# Patient Record
Sex: Female | Born: 1950 | Race: White | Hispanic: No | Marital: Single | Smoking: Current every day smoker
Health system: Southern US, Community
[De-identification: ages and names within clinical notes are randomized; demographics above are authoritative.]

## PROBLEM LIST (undated history)

## (undated) DIAGNOSIS — G43909 Migraine, unspecified, not intractable, without status migrainosus: Secondary | ICD-10-CM

## (undated) DIAGNOSIS — G43409 Hemiplegic migraine, not intractable, without status migrainosus: Secondary | ICD-10-CM

## (undated) DIAGNOSIS — D649 Anemia, unspecified: Secondary | ICD-10-CM

## (undated) DIAGNOSIS — F329 Major depressive disorder, single episode, unspecified: Secondary | ICD-10-CM

## (undated) DIAGNOSIS — E785 Hyperlipidemia, unspecified: Secondary | ICD-10-CM

## (undated) DIAGNOSIS — K219 Gastro-esophageal reflux disease without esophagitis: Secondary | ICD-10-CM

## (undated) DIAGNOSIS — M1990 Unspecified osteoarthritis, unspecified site: Secondary | ICD-10-CM

## (undated) DIAGNOSIS — R519 Headache, unspecified: Secondary | ICD-10-CM

## (undated) DIAGNOSIS — F32A Depression, unspecified: Secondary | ICD-10-CM

## (undated) DIAGNOSIS — I499 Cardiac arrhythmia, unspecified: Secondary | ICD-10-CM

## (undated) DIAGNOSIS — I428 Other cardiomyopathies: Secondary | ICD-10-CM

## (undated) DIAGNOSIS — K5792 Diverticulitis of intestine, part unspecified, without perforation or abscess without bleeding: Secondary | ICD-10-CM

## (undated) DIAGNOSIS — K5900 Constipation, unspecified: Secondary | ICD-10-CM

## (undated) DIAGNOSIS — F419 Anxiety disorder, unspecified: Secondary | ICD-10-CM

## (undated) DIAGNOSIS — M549 Dorsalgia, unspecified: Secondary | ICD-10-CM

## (undated) DIAGNOSIS — G8929 Other chronic pain: Secondary | ICD-10-CM

## (undated) DIAGNOSIS — J189 Pneumonia, unspecified organism: Secondary | ICD-10-CM

## (undated) DIAGNOSIS — C44311 Basal cell carcinoma of skin of nose: Secondary | ICD-10-CM

## (undated) DIAGNOSIS — I639 Cerebral infarction, unspecified: Secondary | ICD-10-CM

## (undated) DIAGNOSIS — R51 Headache: Secondary | ICD-10-CM

## (undated) DIAGNOSIS — Z9289 Personal history of other medical treatment: Secondary | ICD-10-CM

## (undated) DIAGNOSIS — I2510 Atherosclerotic heart disease of native coronary artery without angina pectoris: Secondary | ICD-10-CM

## (undated) HISTORY — DX: Hemiplegic migraine, not intractable, without status migrainosus: G43.409

## (undated) HISTORY — DX: Anxiety disorder, unspecified: F41.9

## (undated) HISTORY — PX: COLONOSCOPY: SHX174

## (undated) HISTORY — PX: BASAL CELL CARCINOMA EXCISION: SHX1214

## (undated) HISTORY — PX: TUBAL LIGATION: SHX77

## (undated) HISTORY — PX: TOTAL HIP ARTHROPLASTY: SHX124

## (undated) HISTORY — PX: VAGINAL HYSTERECTOMY: SUR661

## (undated) HISTORY — PX: TONSILLECTOMY: SUR1361

## (undated) HISTORY — PX: FRACTURE SURGERY: SHX138

## (undated) HISTORY — PX: DILATION AND CURETTAGE OF UTERUS: SHX78

## (undated) HISTORY — PX: TOTAL SHOULDER REPLACEMENT: SUR1217

## (undated) HISTORY — PX: OOPHORECTOMY: SHX86

## (undated) HISTORY — DX: Hyperlipidemia, unspecified: E78.5

---

## 1974-07-28 HISTORY — PX: RHINOPLASTY: SUR1284

## 1978-07-28 HISTORY — PX: AUGMENTATION MAMMAPLASTY: SUR837

## 1998-04-23 ENCOUNTER — Emergency Department (HOSPITAL_COMMUNITY): Admission: EM | Admit: 1998-04-23 | Discharge: 1998-04-23 | Payer: Self-pay | Admitting: Family Medicine

## 1998-05-14 ENCOUNTER — Emergency Department (HOSPITAL_COMMUNITY): Admission: EM | Admit: 1998-05-14 | Discharge: 1998-05-14 | Payer: Self-pay | Admitting: Emergency Medicine

## 1998-09-28 ENCOUNTER — Other Ambulatory Visit: Admission: RE | Admit: 1998-09-28 | Discharge: 1998-09-28 | Payer: Self-pay | Admitting: *Deleted

## 2003-10-27 HISTORY — PX: BUNIONECTOMY: SHX129

## 2006-04-01 ENCOUNTER — Emergency Department (HOSPITAL_COMMUNITY): Admission: EM | Admit: 2006-04-01 | Discharge: 2006-04-01 | Payer: Self-pay | Admitting: Emergency Medicine

## 2006-04-11 ENCOUNTER — Emergency Department (HOSPITAL_COMMUNITY): Admission: EM | Admit: 2006-04-11 | Discharge: 2006-04-11 | Payer: Self-pay | Admitting: Emergency Medicine

## 2006-04-12 ENCOUNTER — Emergency Department (HOSPITAL_COMMUNITY): Admission: EM | Admit: 2006-04-12 | Discharge: 2006-04-12 | Payer: Self-pay | Admitting: Emergency Medicine

## 2006-04-13 ENCOUNTER — Emergency Department (HOSPITAL_COMMUNITY): Admission: EM | Admit: 2006-04-13 | Discharge: 2006-04-13 | Payer: Self-pay | Admitting: Emergency Medicine

## 2006-04-30 ENCOUNTER — Ambulatory Visit: Payer: Self-pay | Admitting: Internal Medicine

## 2006-05-01 ENCOUNTER — Ambulatory Visit: Payer: Self-pay | Admitting: *Deleted

## 2006-08-12 ENCOUNTER — Ambulatory Visit: Payer: Self-pay | Admitting: Nurse Practitioner

## 2006-08-26 ENCOUNTER — Ambulatory Visit: Payer: Self-pay | Admitting: Nurse Practitioner

## 2006-09-09 ENCOUNTER — Ambulatory Visit: Payer: Self-pay | Admitting: Nurse Practitioner

## 2006-10-06 ENCOUNTER — Ambulatory Visit: Payer: Self-pay | Admitting: Nurse Practitioner

## 2006-10-15 ENCOUNTER — Encounter (INDEPENDENT_AMBULATORY_CARE_PROVIDER_SITE_OTHER): Payer: Self-pay | Admitting: *Deleted

## 2006-10-15 ENCOUNTER — Ambulatory Visit (HOSPITAL_COMMUNITY): Admission: RE | Admit: 2006-10-15 | Discharge: 2006-10-15 | Payer: Self-pay | Admitting: Family Medicine

## 2006-10-18 ENCOUNTER — Emergency Department (HOSPITAL_COMMUNITY): Admission: EM | Admit: 2006-10-18 | Discharge: 2006-10-18 | Payer: Self-pay | Admitting: Emergency Medicine

## 2006-10-20 ENCOUNTER — Emergency Department (HOSPITAL_COMMUNITY): Admission: EM | Admit: 2006-10-20 | Discharge: 2006-10-20 | Payer: Self-pay | Admitting: Emergency Medicine

## 2006-10-27 ENCOUNTER — Emergency Department (HOSPITAL_COMMUNITY): Admission: EM | Admit: 2006-10-27 | Discharge: 2006-10-27 | Payer: Self-pay | Admitting: Emergency Medicine

## 2006-11-16 ENCOUNTER — Emergency Department (HOSPITAL_COMMUNITY): Admission: EM | Admit: 2006-11-16 | Discharge: 2006-11-16 | Payer: Self-pay | Admitting: Emergency Medicine

## 2006-11-19 ENCOUNTER — Emergency Department (HOSPITAL_COMMUNITY): Admission: EM | Admit: 2006-11-19 | Discharge: 2006-11-19 | Payer: Self-pay | Admitting: Emergency Medicine

## 2006-11-21 ENCOUNTER — Emergency Department (HOSPITAL_COMMUNITY): Admission: EM | Admit: 2006-11-21 | Discharge: 2006-11-21 | Payer: Self-pay | Admitting: Emergency Medicine

## 2006-11-25 ENCOUNTER — Ambulatory Visit: Payer: Self-pay | Admitting: Internal Medicine

## 2006-12-01 ENCOUNTER — Ambulatory Visit: Payer: Self-pay | Admitting: Internal Medicine

## 2006-12-30 ENCOUNTER — Ambulatory Visit: Payer: Self-pay | Admitting: Internal Medicine

## 2007-01-13 ENCOUNTER — Ambulatory Visit: Payer: Self-pay | Admitting: Internal Medicine

## 2007-01-13 LAB — CONVERTED CEMR LAB
Calcium, Total (PTH): 9.4 mg/dL (ref 8.4–10.5)
PTH: 46.5 pg/mL (ref 14.0–72.0)
Vit D, 1,25-Dihydroxy: 16 — ABNORMAL LOW (ref 20–57)

## 2007-02-01 ENCOUNTER — Ambulatory Visit: Payer: Self-pay | Admitting: Internal Medicine

## 2007-02-23 ENCOUNTER — Emergency Department (HOSPITAL_COMMUNITY): Admission: EM | Admit: 2007-02-23 | Discharge: 2007-02-23 | Payer: Self-pay | Admitting: Emergency Medicine

## 2007-03-01 ENCOUNTER — Ambulatory Visit: Payer: Self-pay | Admitting: Psychiatry

## 2007-03-01 ENCOUNTER — Other Ambulatory Visit (HOSPITAL_COMMUNITY): Admission: RE | Admit: 2007-03-01 | Discharge: 2007-03-15 | Payer: Self-pay | Admitting: Psychiatry

## 2007-03-09 ENCOUNTER — Ambulatory Visit: Payer: Self-pay | Admitting: Physical Medicine & Rehabilitation

## 2007-03-09 ENCOUNTER — Encounter
Admission: RE | Admit: 2007-03-09 | Discharge: 2007-04-27 | Payer: Self-pay | Admitting: Physical Medicine & Rehabilitation

## 2007-03-10 ENCOUNTER — Ambulatory Visit (HOSPITAL_COMMUNITY)
Admission: RE | Admit: 2007-03-10 | Discharge: 2007-03-10 | Payer: Self-pay | Admitting: Physical Medicine & Rehabilitation

## 2007-03-23 ENCOUNTER — Ambulatory Visit: Payer: Self-pay | Admitting: Internal Medicine

## 2007-03-23 LAB — CONVERTED CEMR LAB
Bilirubin Urine: NEGATIVE
Crystals: NEGATIVE
Hemoglobin, Urine: NEGATIVE
Ketones, ur: NEGATIVE mg/dL
Leukocytes, UA: NEGATIVE
Mucus, UA: NEGATIVE
Nitrite: NEGATIVE
RBC / HPF: NONE SEEN
Specific Gravity, Urine: 1.01
Total Protein, Urine: NEGATIVE mg/dL
Urine Glucose: NEGATIVE mg/dL
Urobilinogen, UA: 0.2
pH: 6

## 2007-03-24 DIAGNOSIS — Z87898 Personal history of other specified conditions: Secondary | ICD-10-CM | POA: Insufficient documentation

## 2007-03-24 DIAGNOSIS — M545 Low back pain, unspecified: Secondary | ICD-10-CM | POA: Insufficient documentation

## 2007-03-24 DIAGNOSIS — F32A Depression, unspecified: Secondary | ICD-10-CM | POA: Insufficient documentation

## 2007-03-24 DIAGNOSIS — J309 Allergic rhinitis, unspecified: Secondary | ICD-10-CM | POA: Insufficient documentation

## 2007-03-24 DIAGNOSIS — F419 Anxiety disorder, unspecified: Secondary | ICD-10-CM | POA: Insufficient documentation

## 2007-03-24 DIAGNOSIS — K649 Unspecified hemorrhoids: Secondary | ICD-10-CM | POA: Insufficient documentation

## 2007-03-24 DIAGNOSIS — F329 Major depressive disorder, single episode, unspecified: Secondary | ICD-10-CM | POA: Insufficient documentation

## 2007-04-13 ENCOUNTER — Ambulatory Visit: Payer: Self-pay | Admitting: Internal Medicine

## 2007-04-13 LAB — CONVERTED CEMR LAB
ALT: 16 units/L (ref 0–35)
AST: 20 units/L (ref 0–37)
Albumin: 3.7 g/dL (ref 3.5–5.2)
Alkaline Phosphatase: 119 units/L — ABNORMAL HIGH (ref 39–117)
BUN: 8 mg/dL (ref 6–23)
Basophils Absolute: 0.1 10*3/uL (ref 0.0–0.1)
Basophils Relative: 0.9 % (ref 0.0–1.0)
Bilirubin Urine: NEGATIVE
Bilirubin, Direct: 0.1 mg/dL (ref 0.0–0.3)
CO2: 29 meq/L (ref 19–32)
Calcium: 9.2 mg/dL (ref 8.4–10.5)
Chloride: 108 meq/L (ref 96–112)
Cholesterol: 143 mg/dL (ref 0–200)
Creatinine, Ser: 0.5 mg/dL (ref 0.4–1.2)
Eosinophils Absolute: 0.1 10*3/uL (ref 0.0–0.6)
Eosinophils Relative: 2.2 % (ref 0.0–5.0)
GFR calc Af Amer: 165 mL/min
GFR calc non Af Amer: 136 mL/min
Glucose, Bld: 106 mg/dL — ABNORMAL HIGH (ref 70–99)
HCT: 42.1 % (ref 36.0–46.0)
HDL: 39.6 mg/dL (ref >39.0)
Hemoglobin, Urine: NEGATIVE
Hemoglobin: 14.2 g/dL (ref 12.0–15.0)
Ketones, ur: NEGATIVE mg/dL
LDL Cholesterol: 71 mg/dL (ref 0–99)
Leukocytes, UA: NEGATIVE
Lymphocytes Relative: 39.1 % (ref 12.0–46.0)
MCHC: 33.8 g/dL (ref 30.0–36.0)
MCV: 85.5 fL (ref 78.0–100.0)
Monocytes Absolute: 0.4 10*3/uL (ref 0.2–0.7)
Monocytes Relative: 6.3 % (ref 3.0–11.0)
Neutro Abs: 3.2 10*3/uL (ref 1.4–7.7)
Neutrophils Relative %: 51.5 % (ref 43.0–77.0)
Nitrite: NEGATIVE
Platelets: 274 10*3/uL (ref 150–400)
Potassium: 4.9 meq/L (ref 3.5–5.1)
RBC: 4.93 M/uL (ref 3.87–5.11)
RDW: 13.1 % (ref 11.5–14.6)
Sodium: 143 meq/L (ref 135–145)
Specific Gravity, Urine: 1.015 (ref 1.000–1.03)
TSH: 1.18 microintl units/mL (ref 0.35–5.50)
Total Bilirubin: 0.4 mg/dL (ref 0.3–1.2)
Total CHOL/HDL Ratio: 3.6
Total Protein, Urine: NEGATIVE mg/dL
Total Protein: 6.5 g/dL (ref 6.0–8.3)
Triglycerides: 161 mg/dL — ABNORMAL HIGH (ref 0–149)
Urine Glucose: NEGATIVE mg/dL
Urobilinogen, UA: 1 (ref 0.0–1.0)
VLDL: 32 mg/dL (ref 0–40)
WBC: 6.3 10*3/uL (ref 4.5–10.5)
pH: 7.5 (ref 5.0–8.0)

## 2007-04-14 ENCOUNTER — Encounter (INDEPENDENT_AMBULATORY_CARE_PROVIDER_SITE_OTHER): Payer: Self-pay | Admitting: *Deleted

## 2007-04-18 ENCOUNTER — Emergency Department (HOSPITAL_COMMUNITY): Admission: EM | Admit: 2007-04-18 | Discharge: 2007-04-18 | Payer: Self-pay | Admitting: Family Medicine

## 2007-05-07 ENCOUNTER — Ambulatory Visit: Payer: Self-pay | Admitting: Internal Medicine

## 2007-05-07 DIAGNOSIS — G43909 Migraine, unspecified, not intractable, without status migrainosus: Secondary | ICD-10-CM

## 2007-05-07 DIAGNOSIS — K5730 Diverticulosis of large intestine without perforation or abscess without bleeding: Secondary | ICD-10-CM | POA: Insufficient documentation

## 2007-05-07 DIAGNOSIS — M899 Disorder of bone, unspecified: Secondary | ICD-10-CM | POA: Insufficient documentation

## 2007-05-07 DIAGNOSIS — K219 Gastro-esophageal reflux disease without esophagitis: Secondary | ICD-10-CM | POA: Insufficient documentation

## 2007-05-07 DIAGNOSIS — I4949 Other premature depolarization: Secondary | ICD-10-CM | POA: Insufficient documentation

## 2007-05-07 DIAGNOSIS — M949 Disorder of cartilage, unspecified: Secondary | ICD-10-CM

## 2007-05-07 DIAGNOSIS — M5030 Other cervical disc degeneration, unspecified cervical region: Secondary | ICD-10-CM | POA: Insufficient documentation

## 2007-05-07 DIAGNOSIS — E785 Hyperlipidemia, unspecified: Secondary | ICD-10-CM | POA: Insufficient documentation

## 2007-05-07 DIAGNOSIS — M5136 Other intervertebral disc degeneration, lumbar region: Secondary | ICD-10-CM | POA: Insufficient documentation

## 2007-05-24 ENCOUNTER — Ambulatory Visit: Payer: Self-pay | Admitting: Internal Medicine

## 2007-06-11 ENCOUNTER — Inpatient Hospital Stay (HOSPITAL_COMMUNITY): Admission: RE | Admit: 2007-06-11 | Discharge: 2007-06-15 | Payer: Self-pay | Admitting: Psychiatry

## 2007-06-11 ENCOUNTER — Ambulatory Visit: Payer: Self-pay | Admitting: Psychiatry

## 2007-08-07 ENCOUNTER — Emergency Department (HOSPITAL_COMMUNITY): Admission: EM | Admit: 2007-08-07 | Discharge: 2007-08-07 | Payer: Self-pay | Admitting: Emergency Medicine

## 2007-10-07 ENCOUNTER — Emergency Department (HOSPITAL_COMMUNITY): Admission: EM | Admit: 2007-10-07 | Discharge: 2007-10-07 | Payer: Self-pay | Admitting: Emergency Medicine

## 2007-10-08 ENCOUNTER — Encounter: Payer: Self-pay | Admitting: Internal Medicine

## 2007-11-19 ENCOUNTER — Emergency Department (HOSPITAL_COMMUNITY): Admission: EM | Admit: 2007-11-19 | Discharge: 2007-11-19 | Payer: Self-pay | Admitting: Emergency Medicine

## 2008-02-02 ENCOUNTER — Ambulatory Visit: Payer: Self-pay | Admitting: Internal Medicine

## 2008-02-22 ENCOUNTER — Emergency Department (HOSPITAL_COMMUNITY): Admission: EM | Admit: 2008-02-22 | Discharge: 2008-02-22 | Payer: Self-pay | Admitting: Emergency Medicine

## 2008-02-27 ENCOUNTER — Inpatient Hospital Stay (HOSPITAL_COMMUNITY): Admission: EM | Admit: 2008-02-27 | Discharge: 2008-03-01 | Payer: Self-pay | Admitting: Emergency Medicine

## 2008-02-28 ENCOUNTER — Encounter: Payer: Self-pay | Admitting: Internal Medicine

## 2008-02-29 ENCOUNTER — Encounter (INDEPENDENT_AMBULATORY_CARE_PROVIDER_SITE_OTHER): Payer: Self-pay | Admitting: Internal Medicine

## 2008-03-16 ENCOUNTER — Ambulatory Visit: Payer: Self-pay | Admitting: Family Medicine

## 2008-03-23 ENCOUNTER — Ambulatory Visit: Payer: Self-pay | Admitting: Internal Medicine

## 2008-03-23 ENCOUNTER — Ambulatory Visit (HOSPITAL_COMMUNITY): Admission: RE | Admit: 2008-03-23 | Discharge: 2008-03-23 | Payer: Self-pay | Admitting: Internal Medicine

## 2008-05-11 IMAGING — CR DG ABDOMEN 1V
2 series · 2 of 2 positions shown · non-contrast
Comparison: None.

CLINICAL DATA: Lower abdominal pain.
 ABDOMEN (FLAT) ? 1 VIEW ? 04/13/06 AT 6660 HOURS:

[t abdomen supine (1 of 2)]
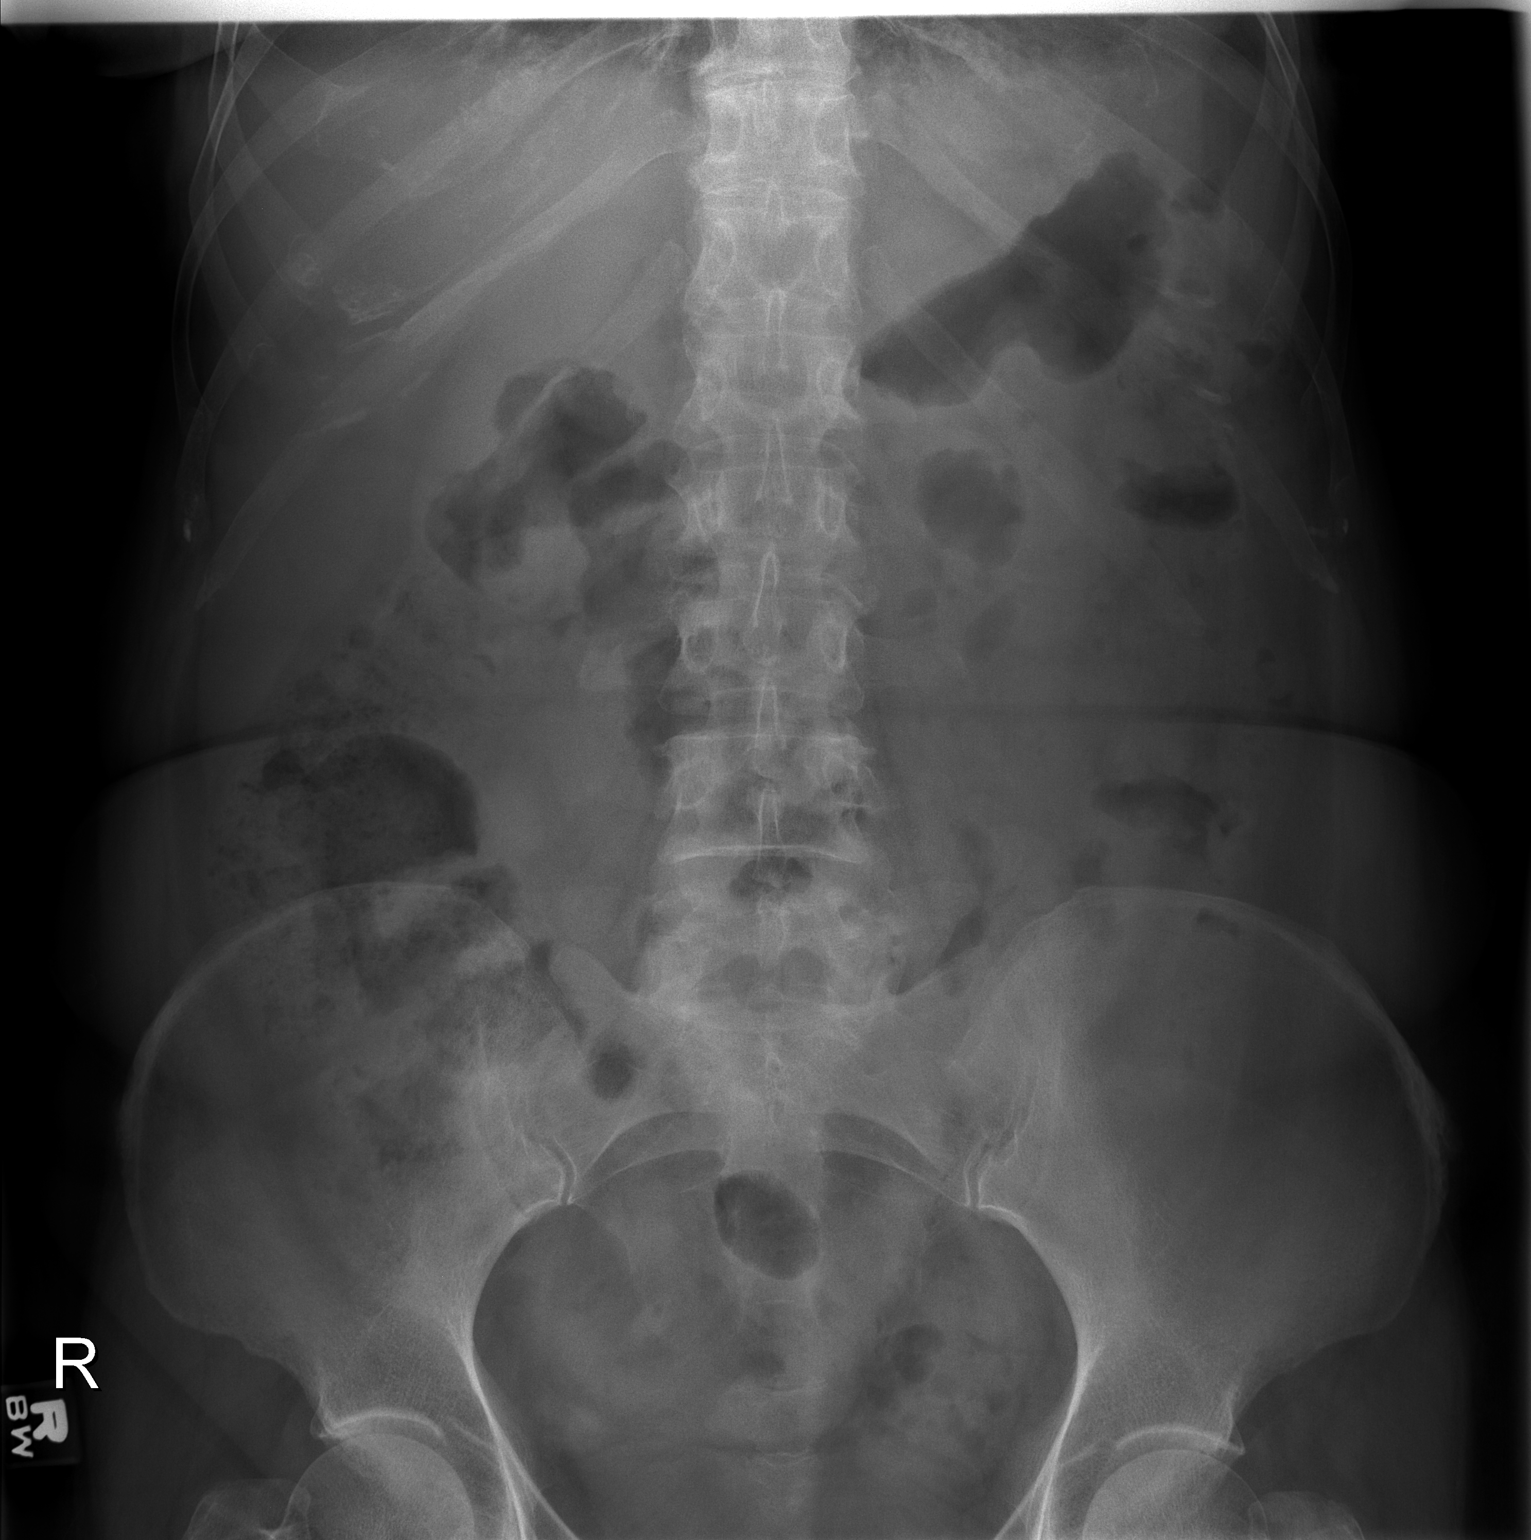

[t abdomen supine (2 of 2)]
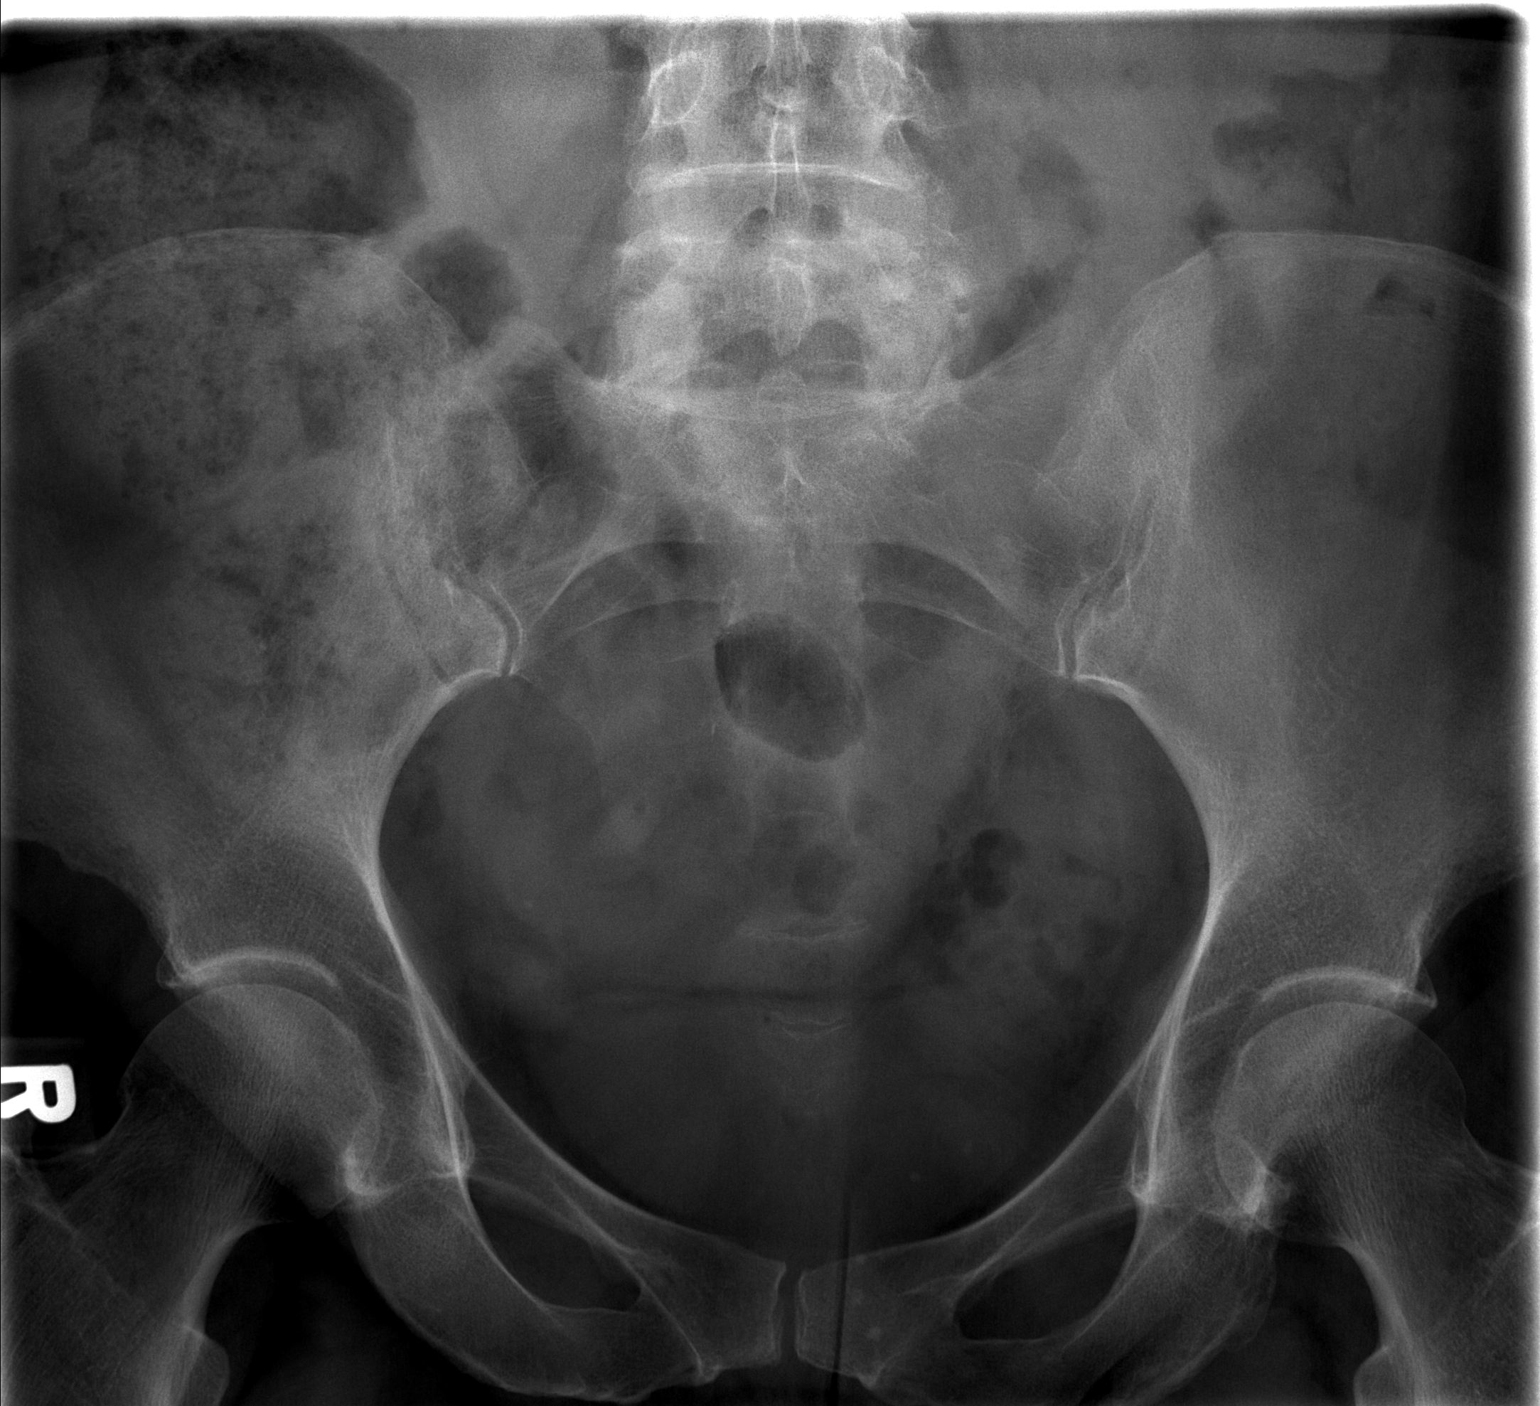

[2 of 2 positions shown; findings below may reference images not displayed]

FINDINGS: Moderate stool is present in the ascending colon. There are no disproportionately loops of bowel.  Calcifications project over the left side of the pelvis.  Urinary calculi are not excluded.  Soft tissues are within normal limits.  Mild degenerative changes in the spine are noted.
IMPRESSION: 1.  Moderate stool in the ascending colon.
 2.  Nonobstructive bowel gas pattern.
 3.  Left pelvic calcifications; phleboliths versus ureteral calculi.

## 2008-05-11 IMAGING — CT CT ABDOMEN W/O CM
1 series · 16 of 32 positions shown, 20 images · IV contrast (agent unspecified)
Comparison: None.

CLINICAL DATA: Abdominal pain.  Left groin pain for one day.  Nausea.
 ABDOMEN CT WITHOUT CONTRAST:
TECHNIQUE: Multidetector CT imaging of the abdomen was performed following the standard protocol without IV contrast.
TECHNIQUE: Multidetector CT imaging of the pelvis was performed following the standard protocol without IV contrast.

[Series 2: stone_wo 5.0 b40f st · axial · 0.69mm/px · z∈[-413,-73]mm · 16 of 96 slices shown, 20 images]
[im 7/96  soft-tissue]
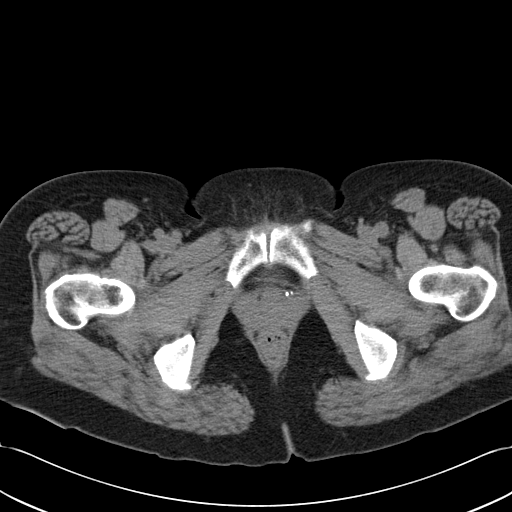
[im 7/96  bone]
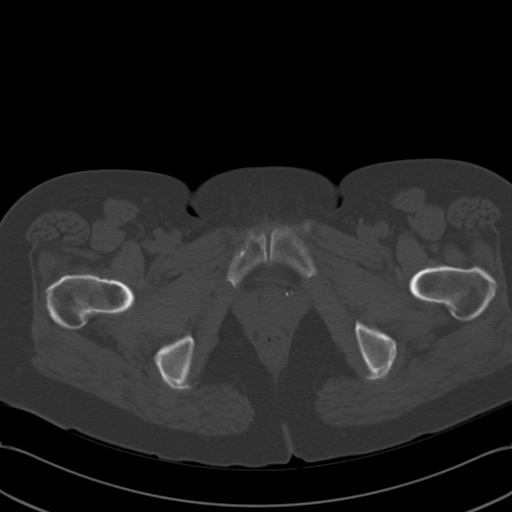
[im 13/96  soft-tissue]
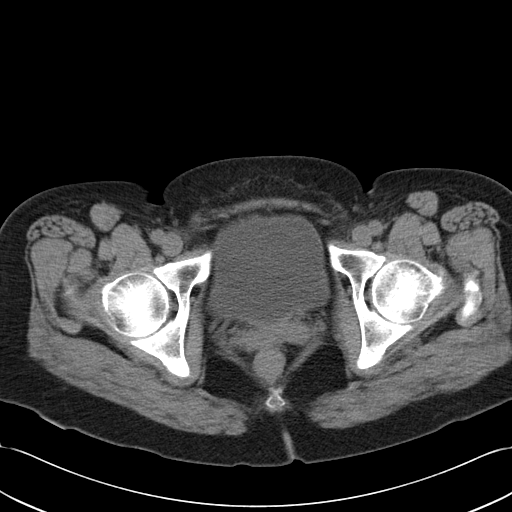
[im 19/96  soft-tissue]
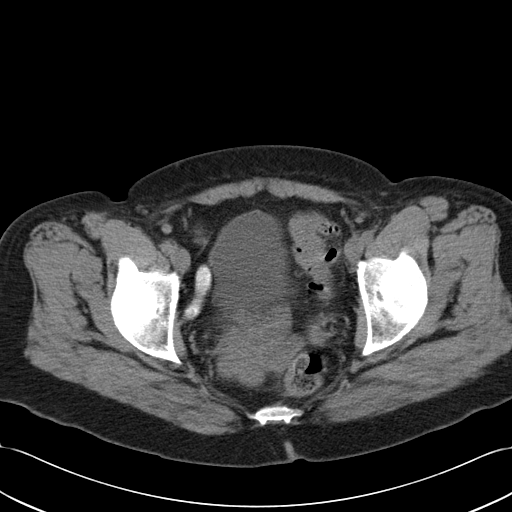
[im 25/96  soft-tissue]
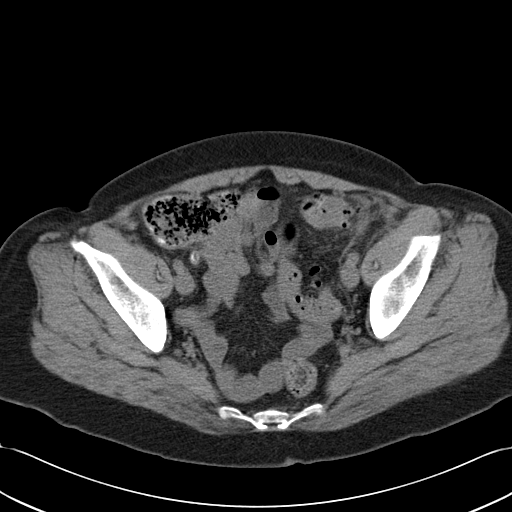
[im 31/96  soft-tissue]
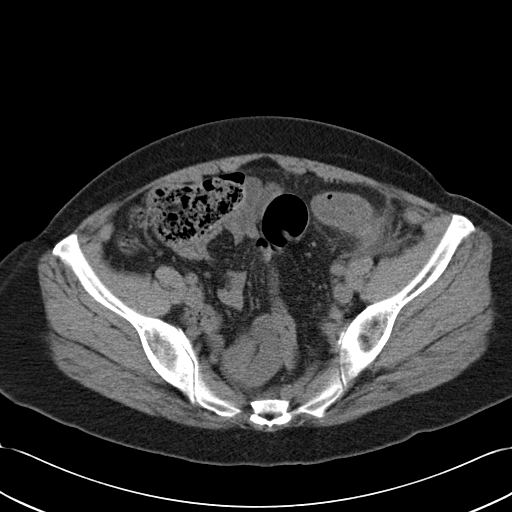
[im 37/96  soft-tissue]
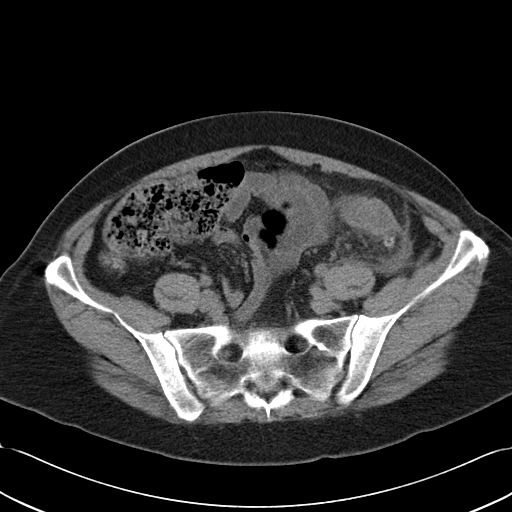
[im 43/96  soft-tissue]
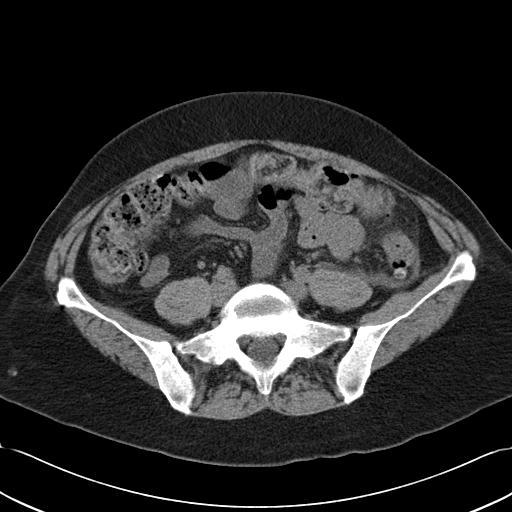
[im 53/96  soft-tissue]
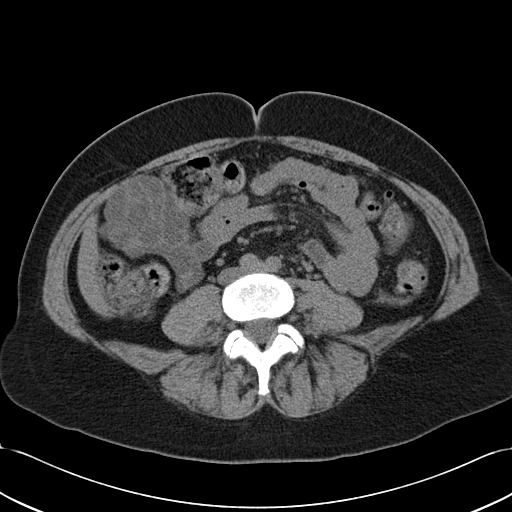
[im 59/96  soft-tissue]
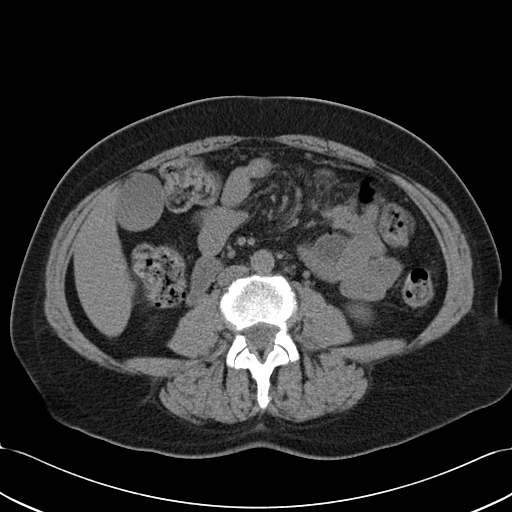
[im 59/96  bone]
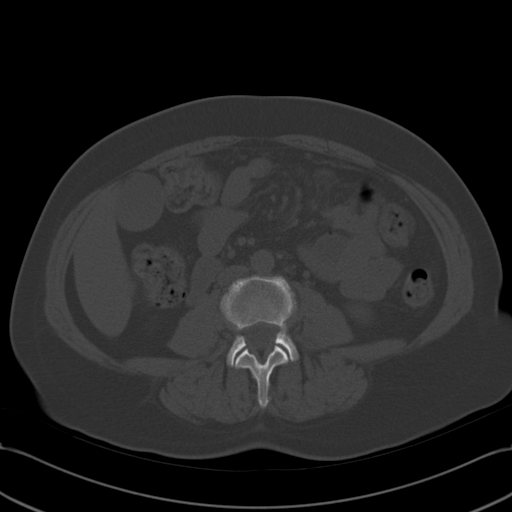
[im 65/96  soft-tissue]
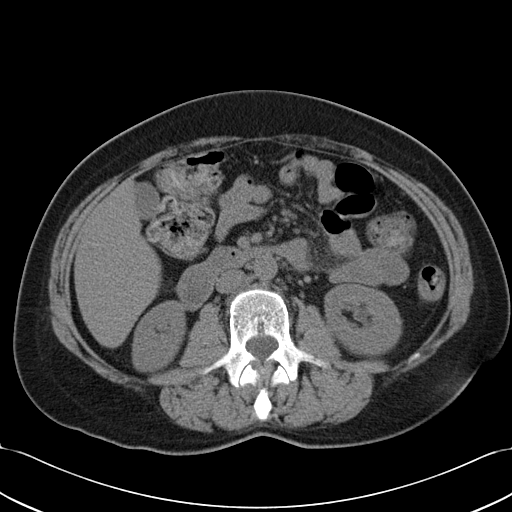
[im 71/96  soft-tissue]
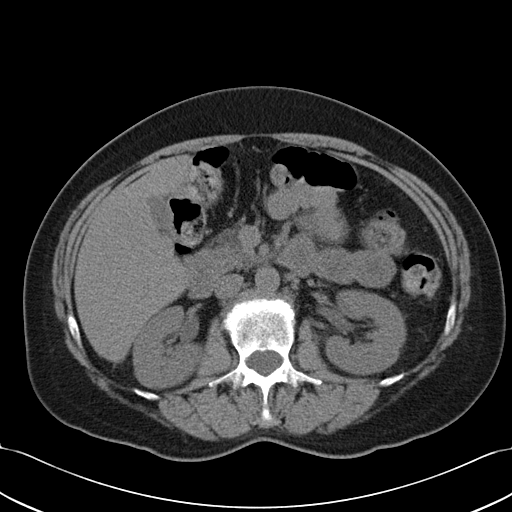
[im 77/96  soft-tissue]
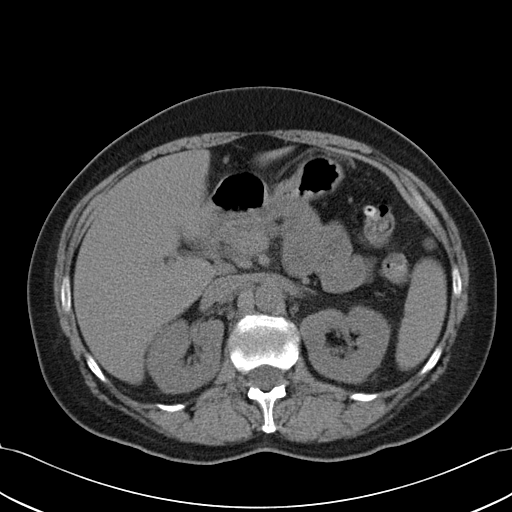
[im 83/96  soft-tissue]
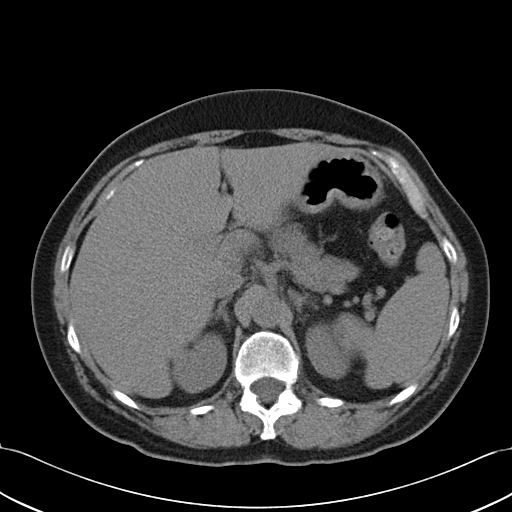
[im 83/96  lung]
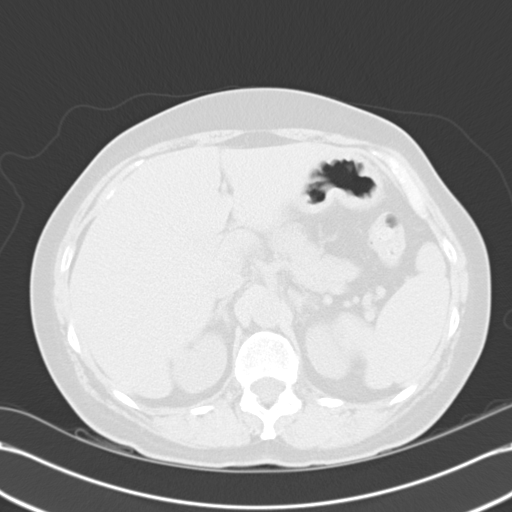
[im 86/96  lung]
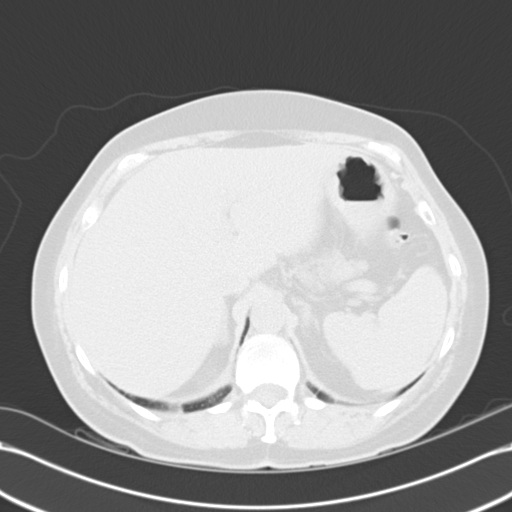
[im 89/96  soft-tissue]
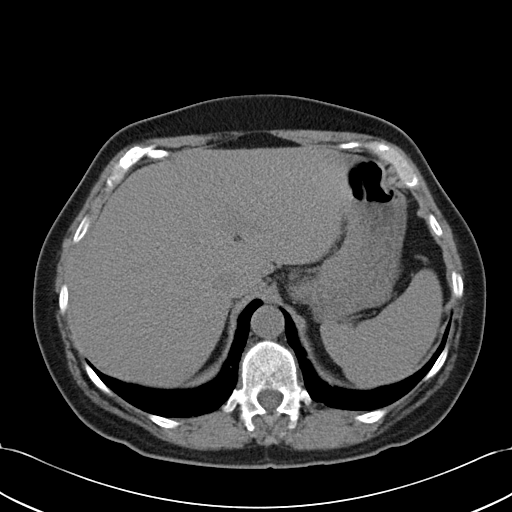
[im 89/96  lung]
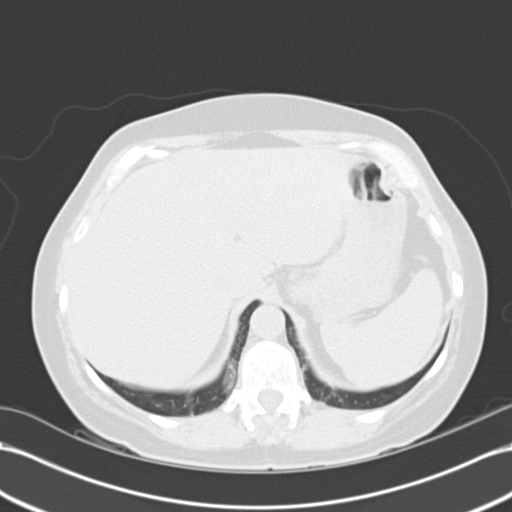
[im 92/96  lung]
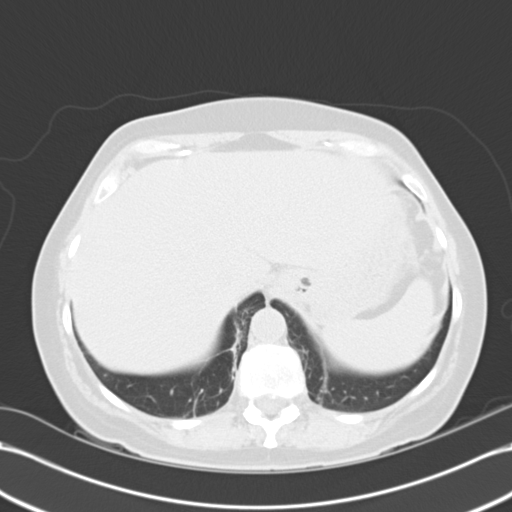

[16 of 32 positions shown; findings below may reference images not displayed]

FINDINGS: Please note that sensitivity and specificity for subtle/small lesions are decreased without the administration of IV contrast.  The visualized portion of the liver, gallbladder, adrenal glands, kidneys, spleen, pancreas, stomach and small bowel are unremarkable.  No pathologically enlarged lymph nodes.
IMPRESSION: No acute findings. 
 PELVIS CT WITHOUT CONTRAST:
FINDINGS: There are inflammatory changes about the descending and sigmoid colon which extend along the left paracolic gutter.  No free air or organized fluid collection to suggest abscess.  The uterus is absent.
IMPRESSION: Diverticulitis.

## 2008-06-15 ENCOUNTER — Emergency Department (HOSPITAL_COMMUNITY): Admission: EM | Admit: 2008-06-15 | Discharge: 2008-06-15 | Payer: Self-pay | Admitting: Emergency Medicine

## 2008-08-10 ENCOUNTER — Ambulatory Visit: Payer: Self-pay | Admitting: Family Medicine

## 2008-11-23 ENCOUNTER — Ambulatory Visit: Payer: Self-pay | Admitting: Family Medicine

## 2008-11-24 IMAGING — CR DG CHEST 2V
2 series · 2 of 2 positions shown · non-contrast
Comparison: None.

CLINICAL DATA: Chest pain, dyspnea and fever for 3 days.  
 CHEST - 2 VIEW:

[w chest pa]
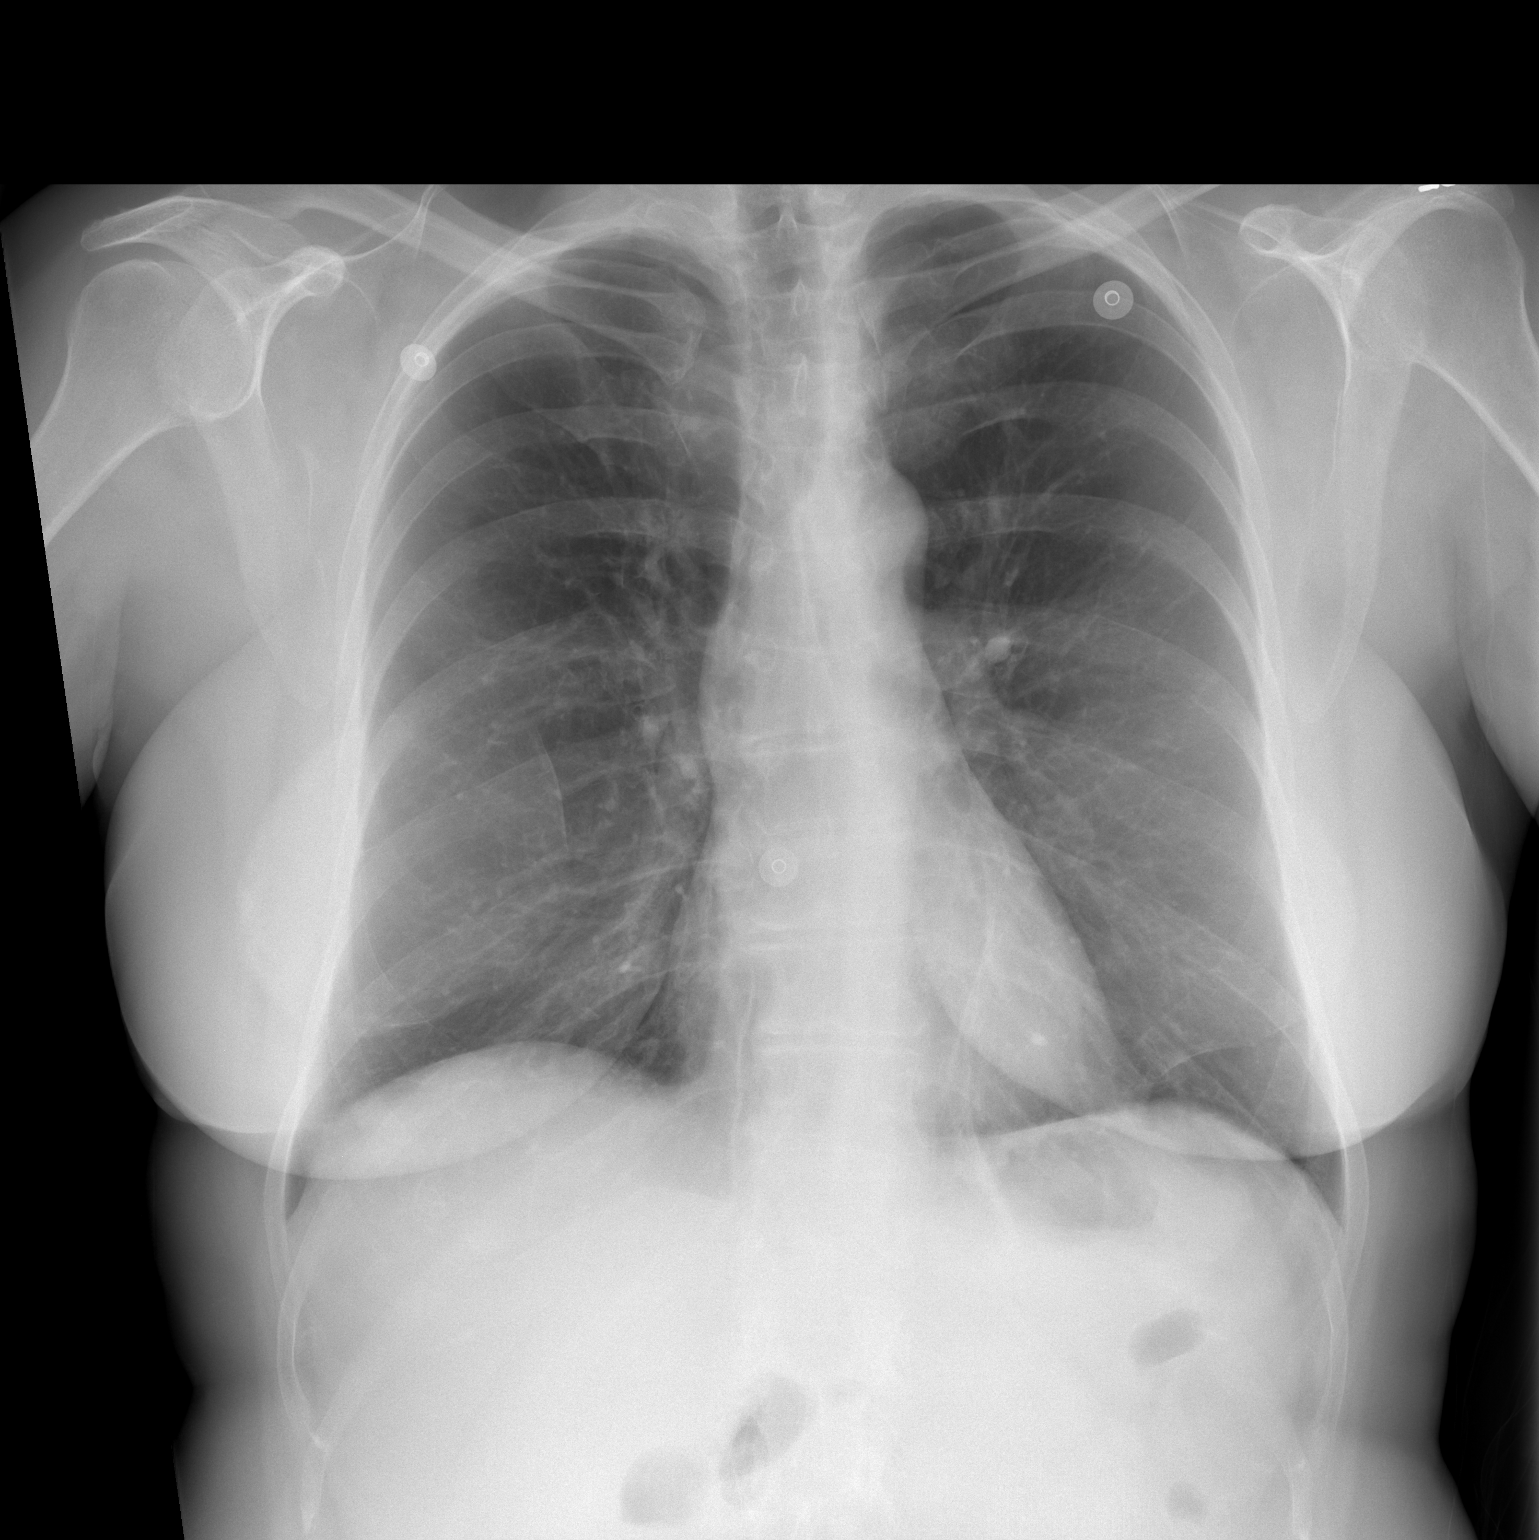

[w chest lat]
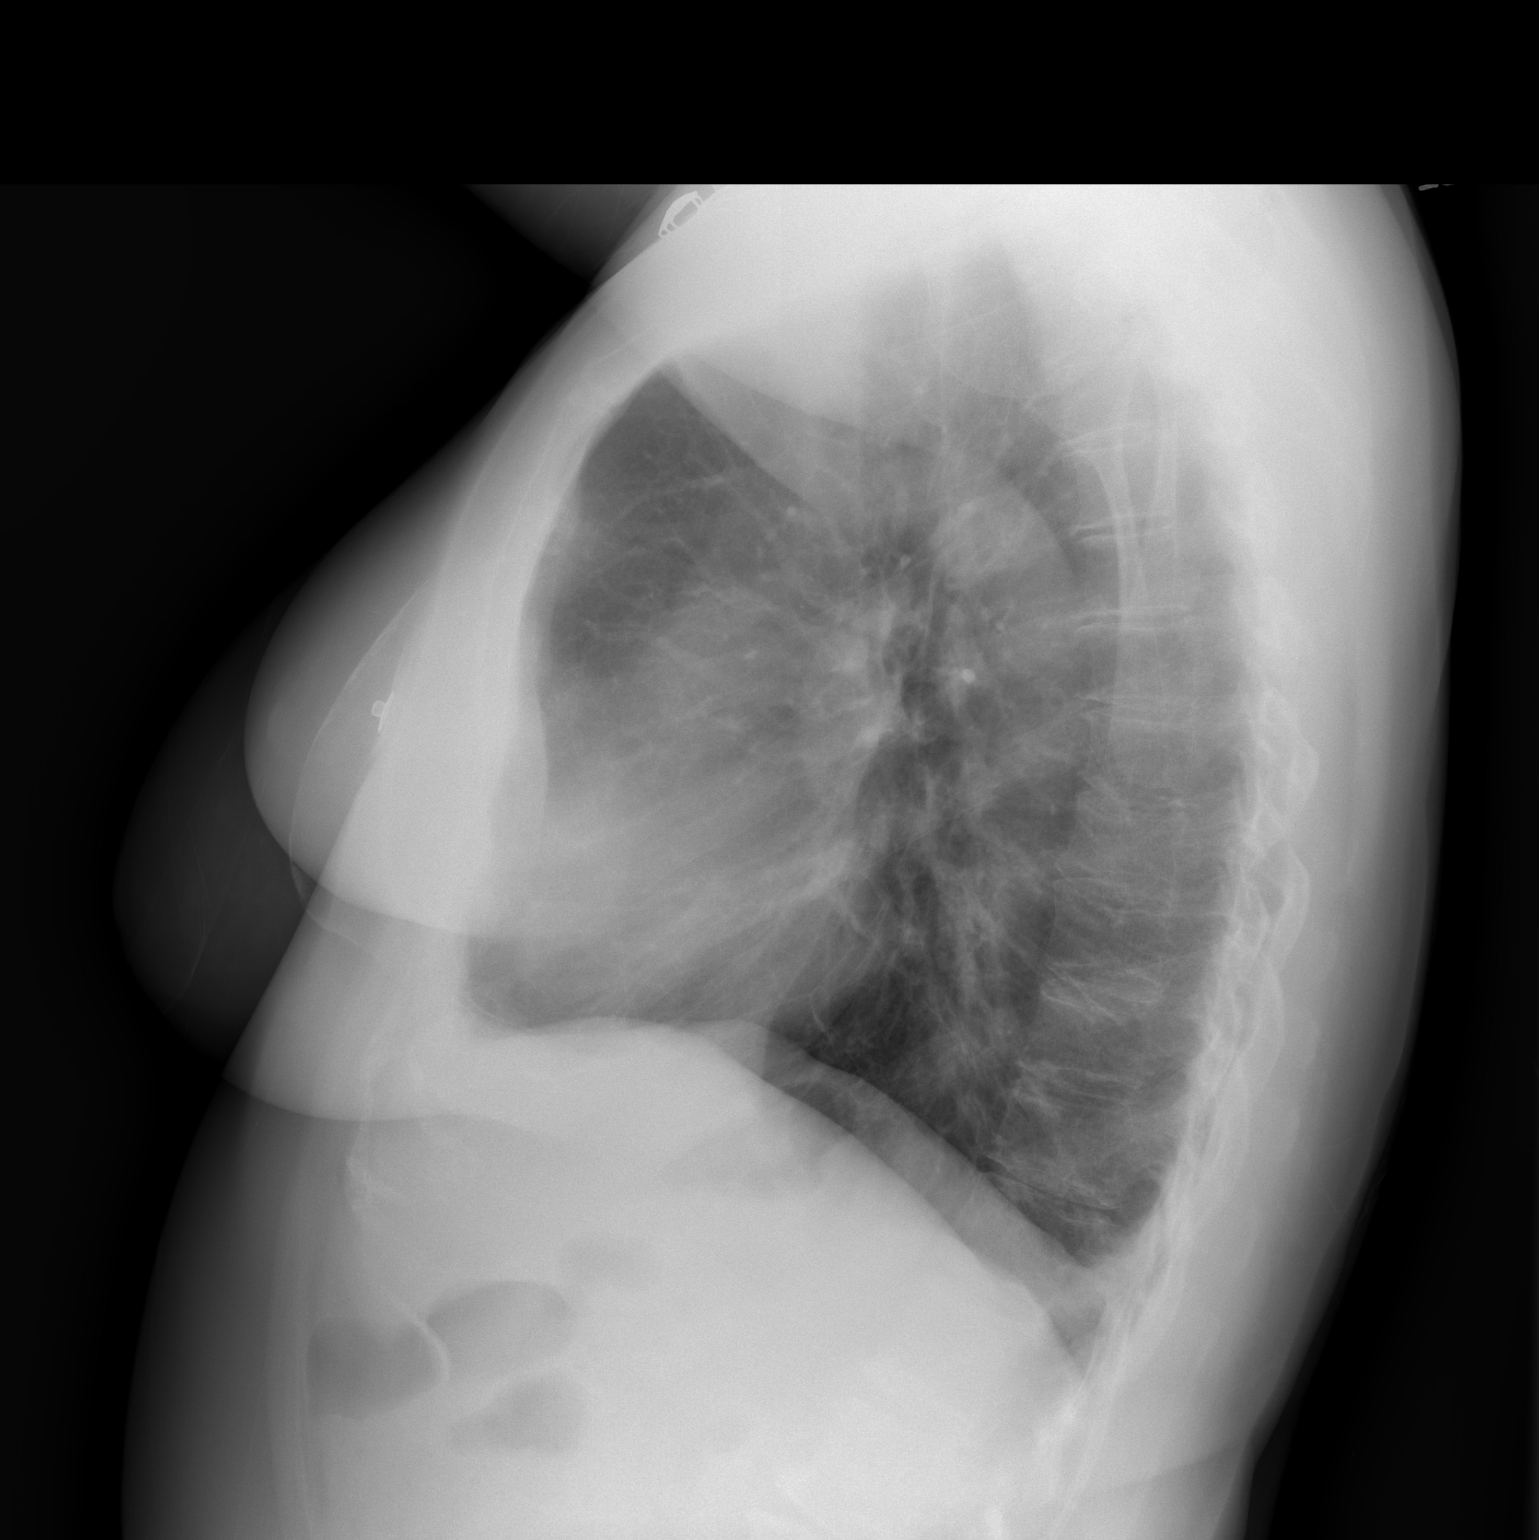

[2 of 2 positions shown; findings below may reference images not displayed]

FINDINGS: The cardiomediastinal contours are normal.  The lungs appear mildly hyperinflated but clear.   There is no pleural effusion or pneumothorax.  Bilateral breast implants are noted.  There are no acute osseous findings.
IMPRESSION: No acute chest findings.  Mild emphysema is suspected.

## 2008-12-07 ENCOUNTER — Emergency Department (HOSPITAL_COMMUNITY): Admission: EM | Admit: 2008-12-07 | Discharge: 2008-12-07 | Payer: Self-pay | Admitting: Emergency Medicine

## 2008-12-14 IMAGING — CR DG CERVICAL SPINE COMPLETE 4+V
5 series · 5 of 5 positions shown · non-contrast
Comparison: None.

CLINICAL DATA: Fall with neck pain and pain down left arm. 
 CERVIDAL SPINE ? 5 VIEW:

[w c-spine lat]
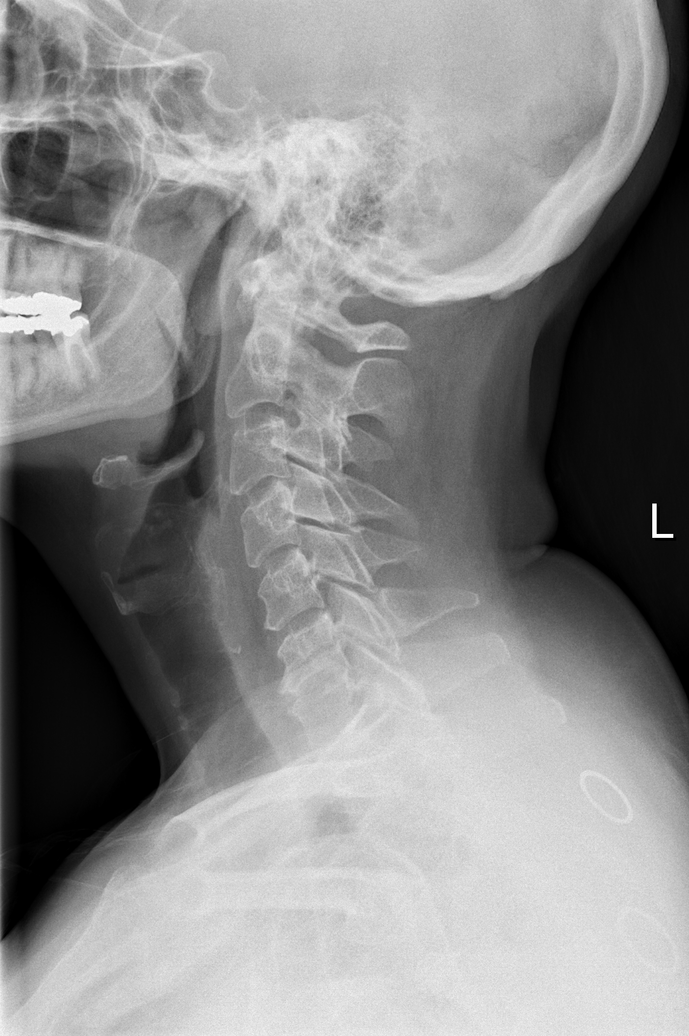

[w c-spine oblique (1 of 2)]
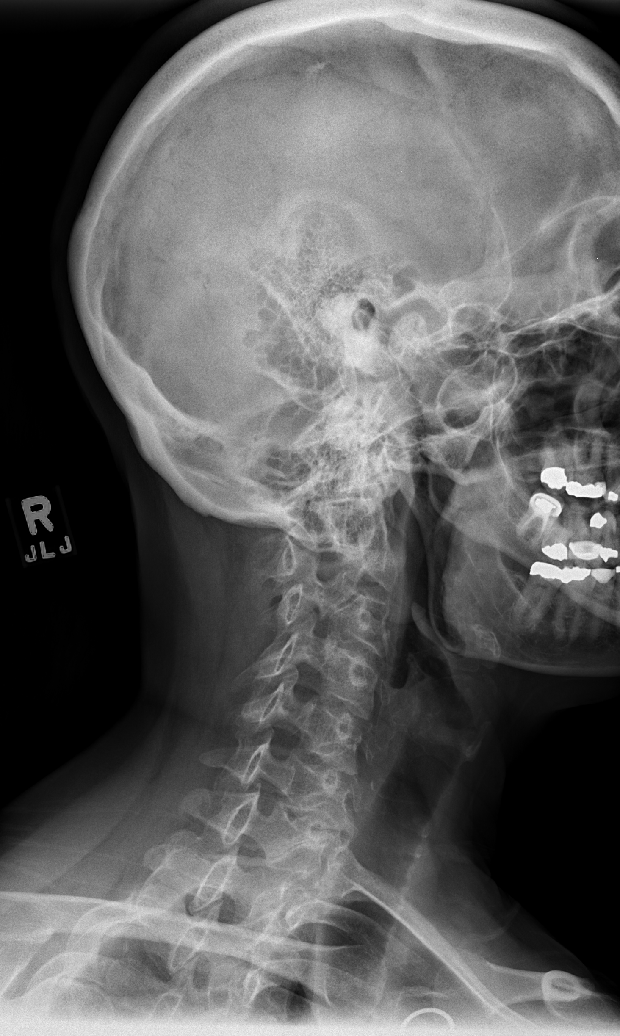

[w c-spine oblique (2 of 2)]
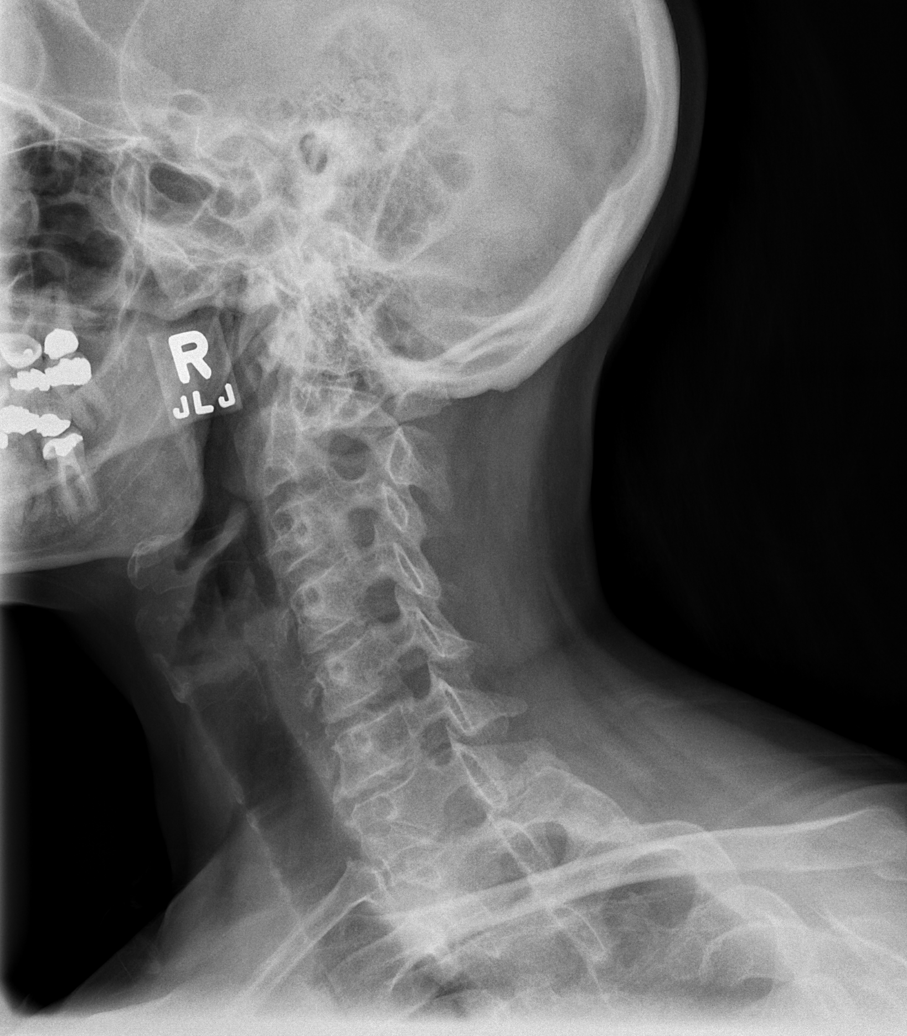

[t c-spine a.p.]
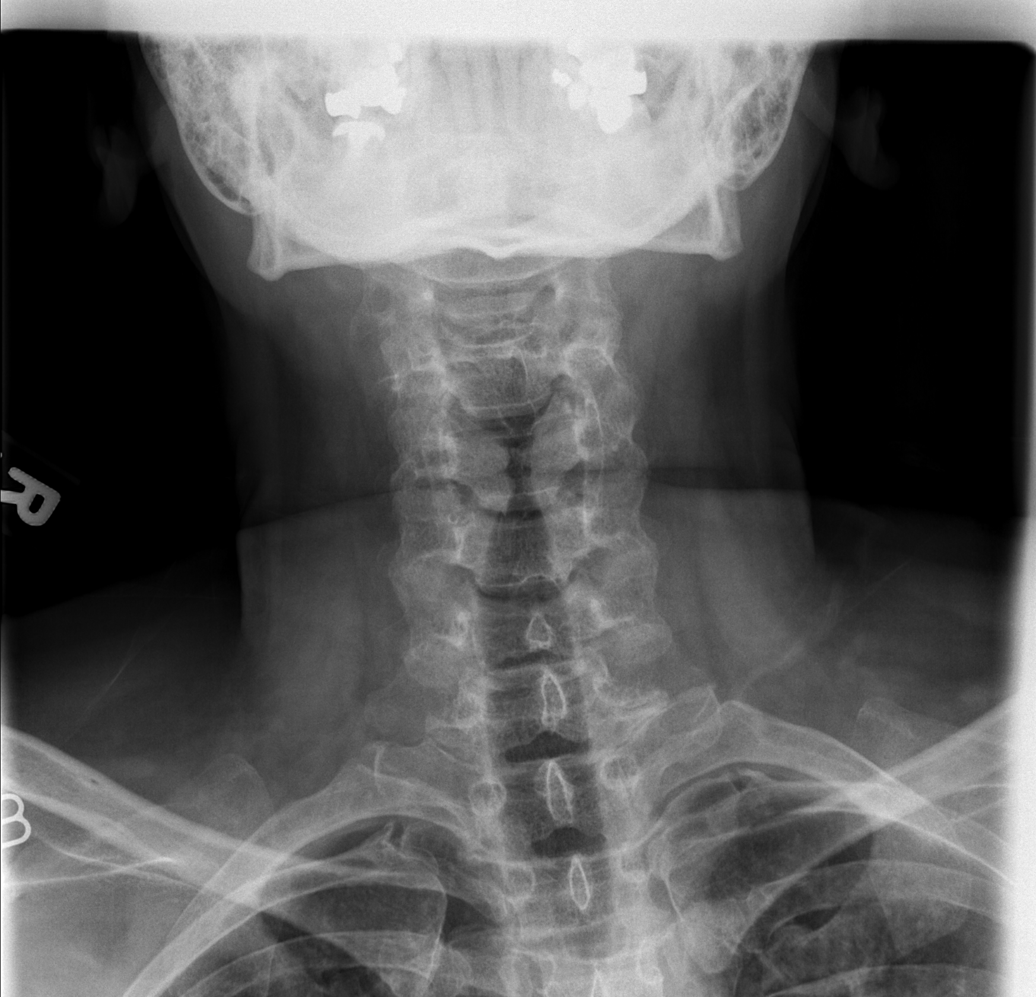

[t c-spine odontoid]
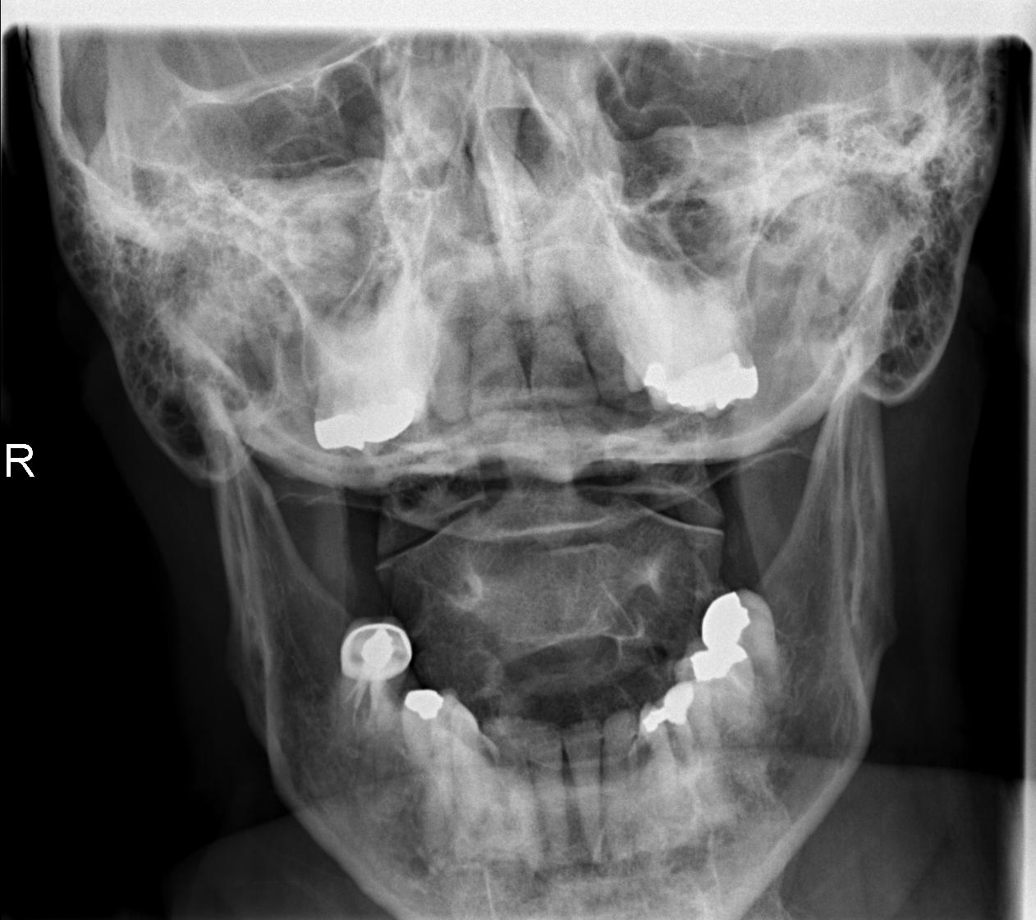

[5 of 5 positions shown; findings below may reference images not displayed]

FINDINGS: Cervical spine is visualized from the occiput to the C7-T1 junction.  Prevertebral soft tissues are within normal limits, alignment is anatomic and vertebral body height is maintained.  Mild endplate degenerative changes and facet sclerosis are seen throughout with loss of disc space heights at C6-7.  Neural foramina are patent.  Dens is obscured on the dedicated view.
IMPRESSION: Spondylosis without fracture or subluxation.

## 2008-12-14 IMAGING — CR DG HIP (WITH OR WITHOUT PELVIS) 2-3V*L*
4 series · 4 of 4 positions shown · non-contrast
Comparison: none

CLINICAL DATA: Fall with left hip pain.
 LEFT HIP ? 3 VIEWS ? 11/16/06:

[t pelvis a.p. (1 of 2)]
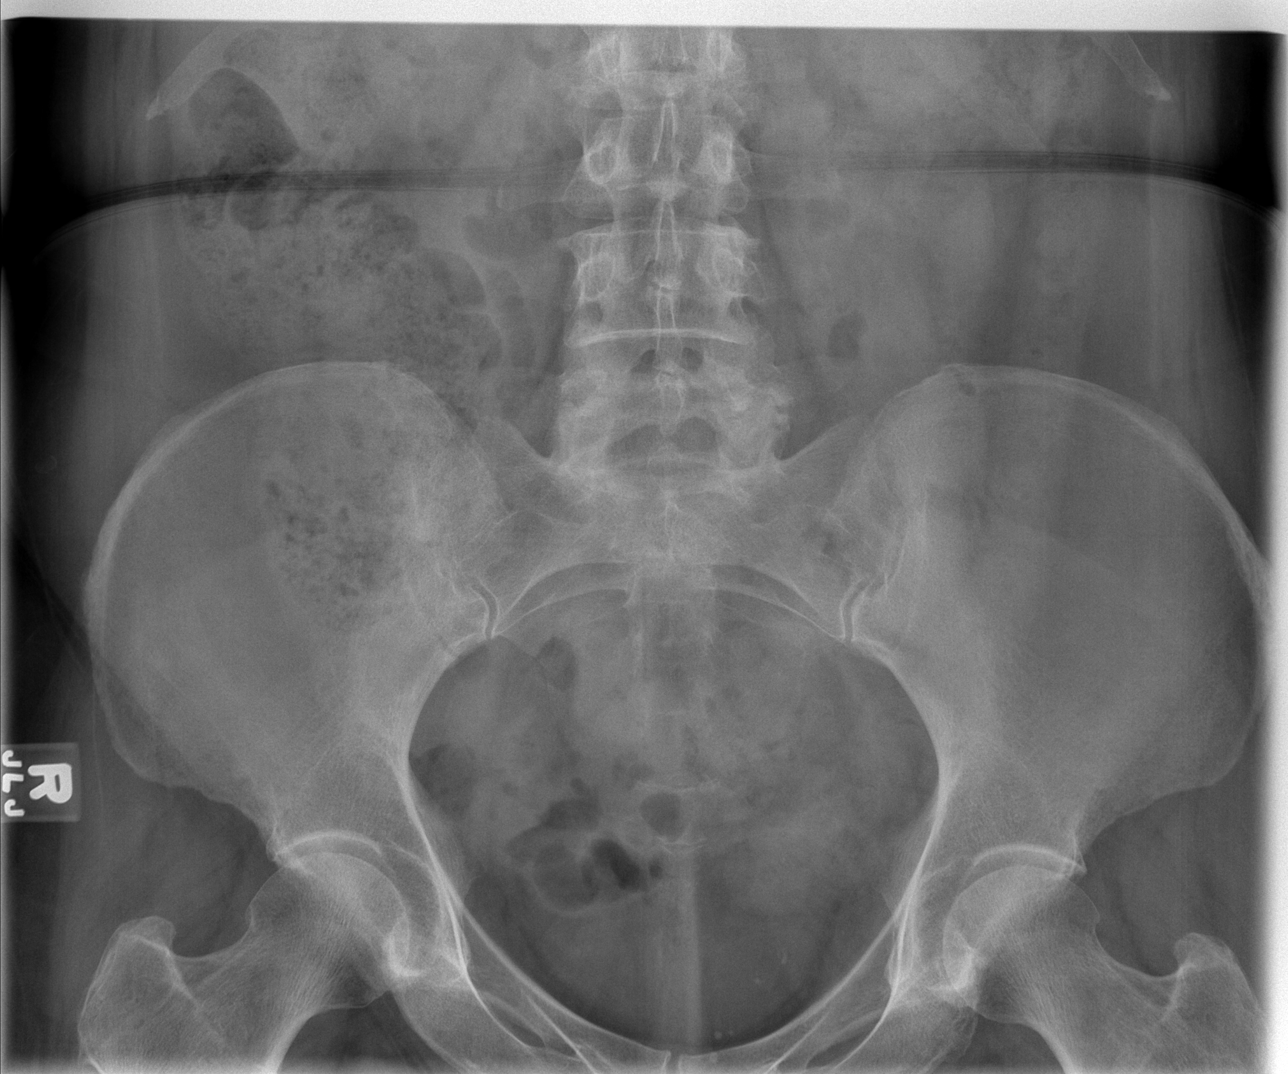

[t pelvis a.p. (2 of 2)]
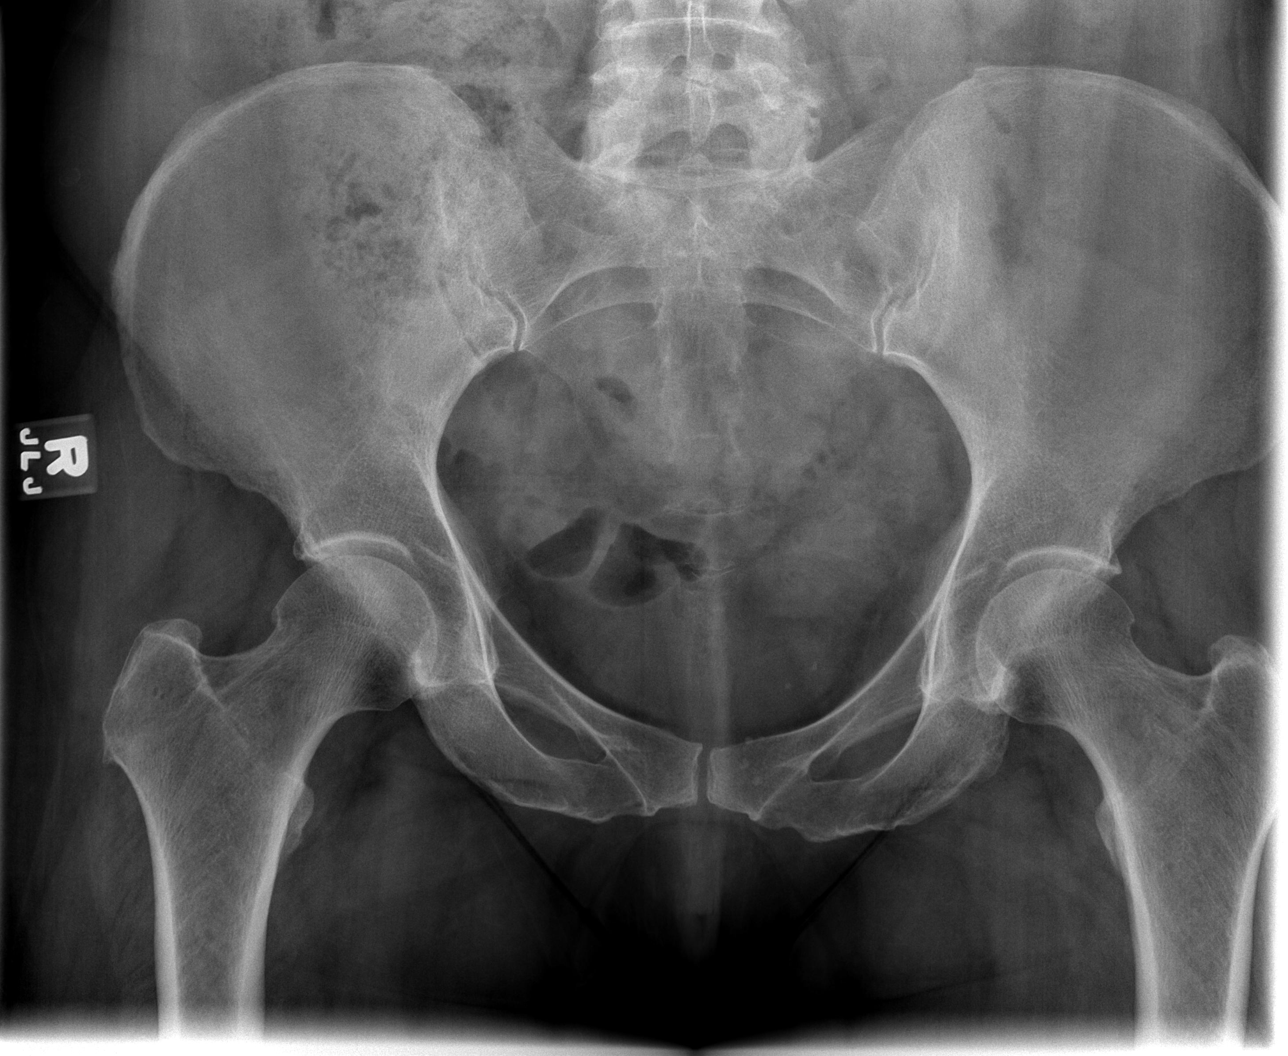

[t hip ap left]
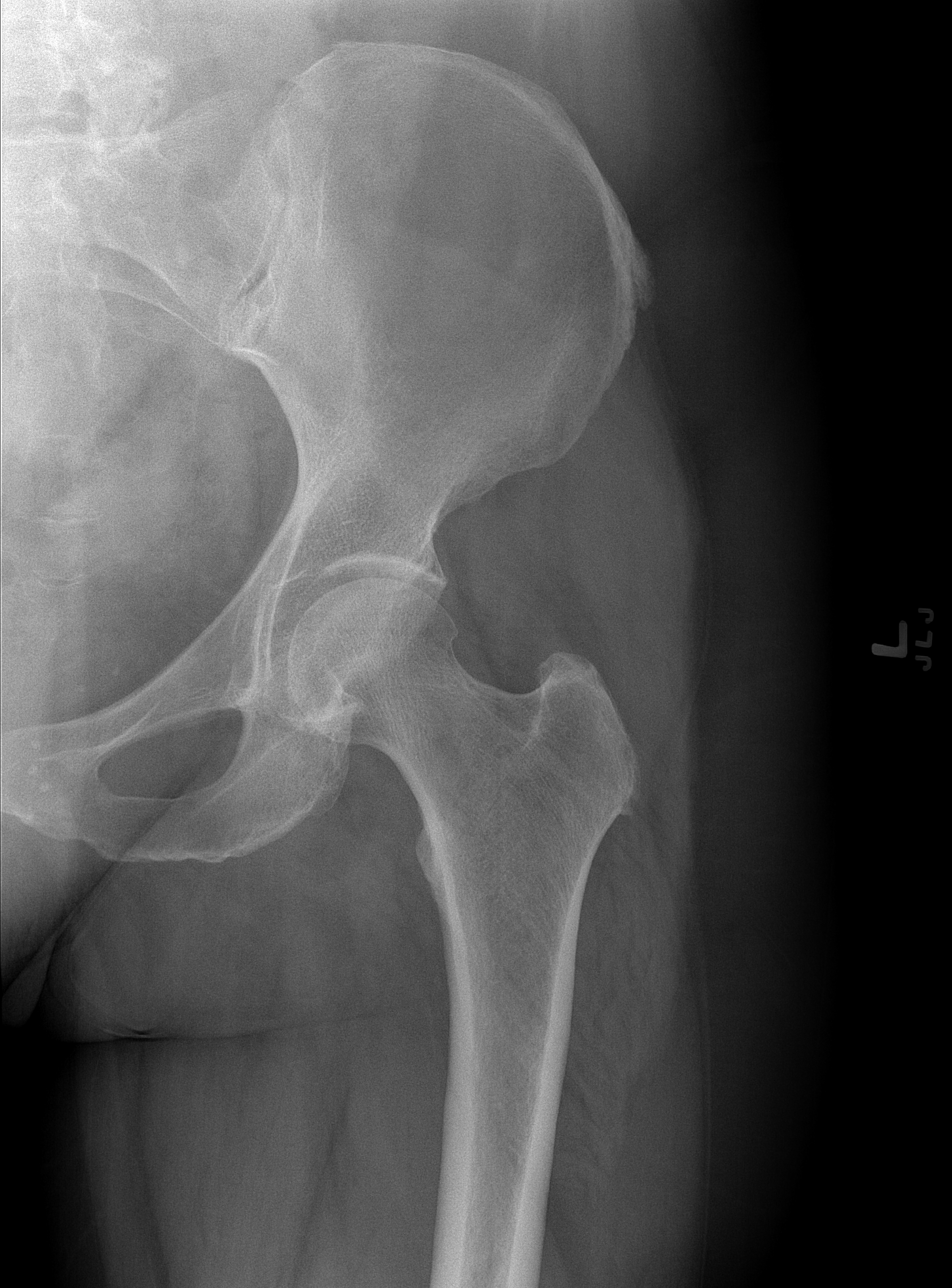

[t hip frog leg left]
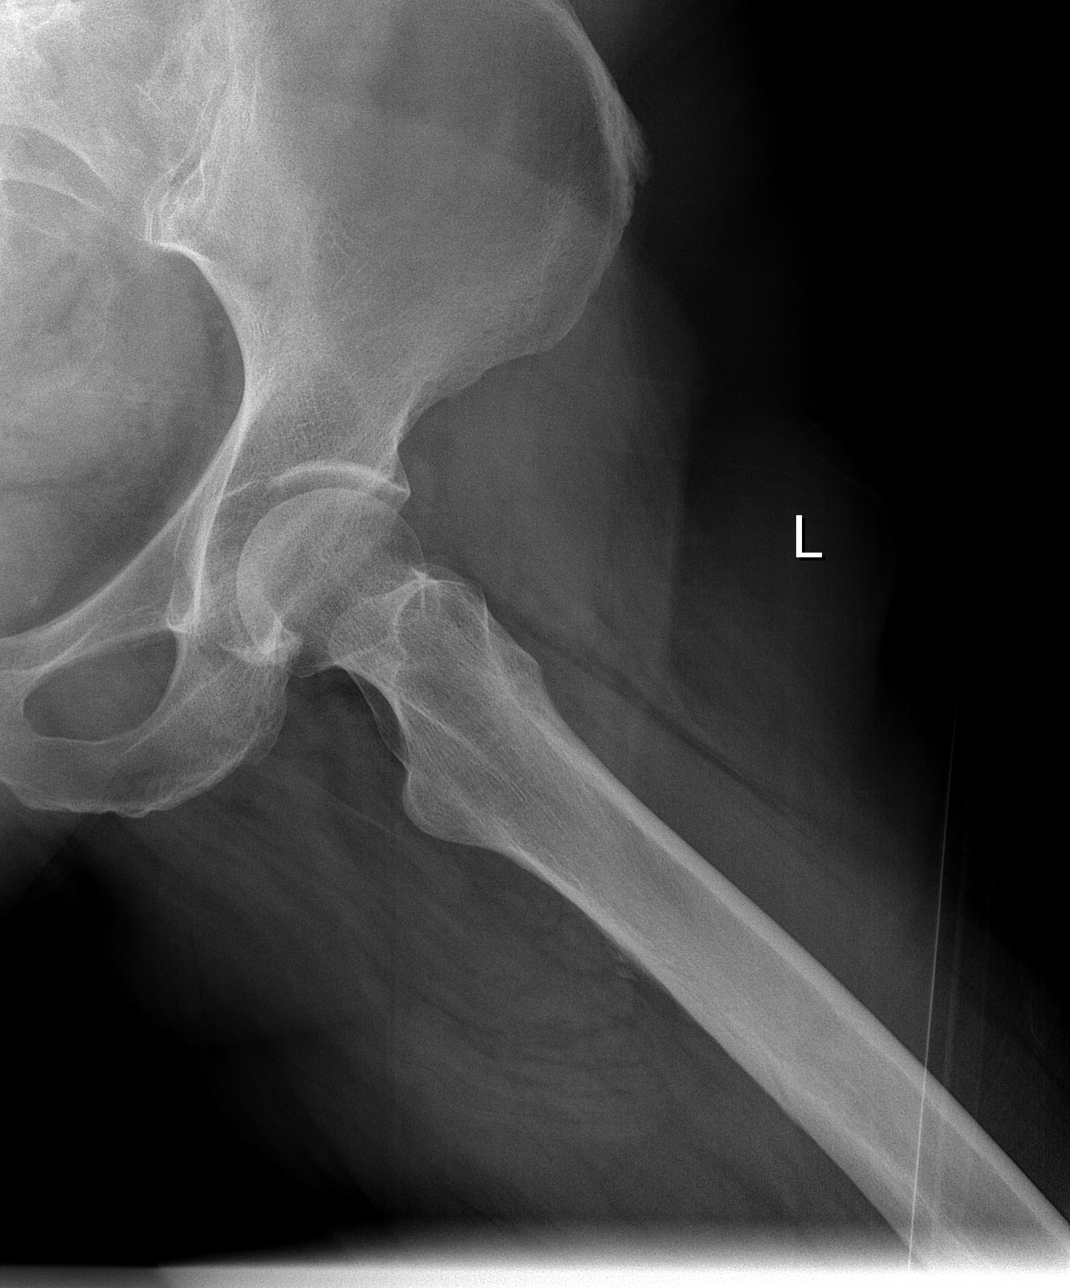

[4 of 4 positions shown; findings below may reference images not displayed]

FINDINGS: No fracture or dislocation. Minimal collar osteophytosis in the hips bilaterally.
IMPRESSION: Minimal degenerative changes in the hips bilaterally without acute finding.

## 2008-12-17 IMAGING — CR DG LUMBAR SPINE COMPLETE 4+V
5 series · 5 of 5 positions shown · non-contrast
Comparison: None available.

CLINICAL DATA: Fell-low back pain with numbness and tingling in legs.
 LUMBAR SPINE ? 4 VIEW:

[t l-spine a.p.]
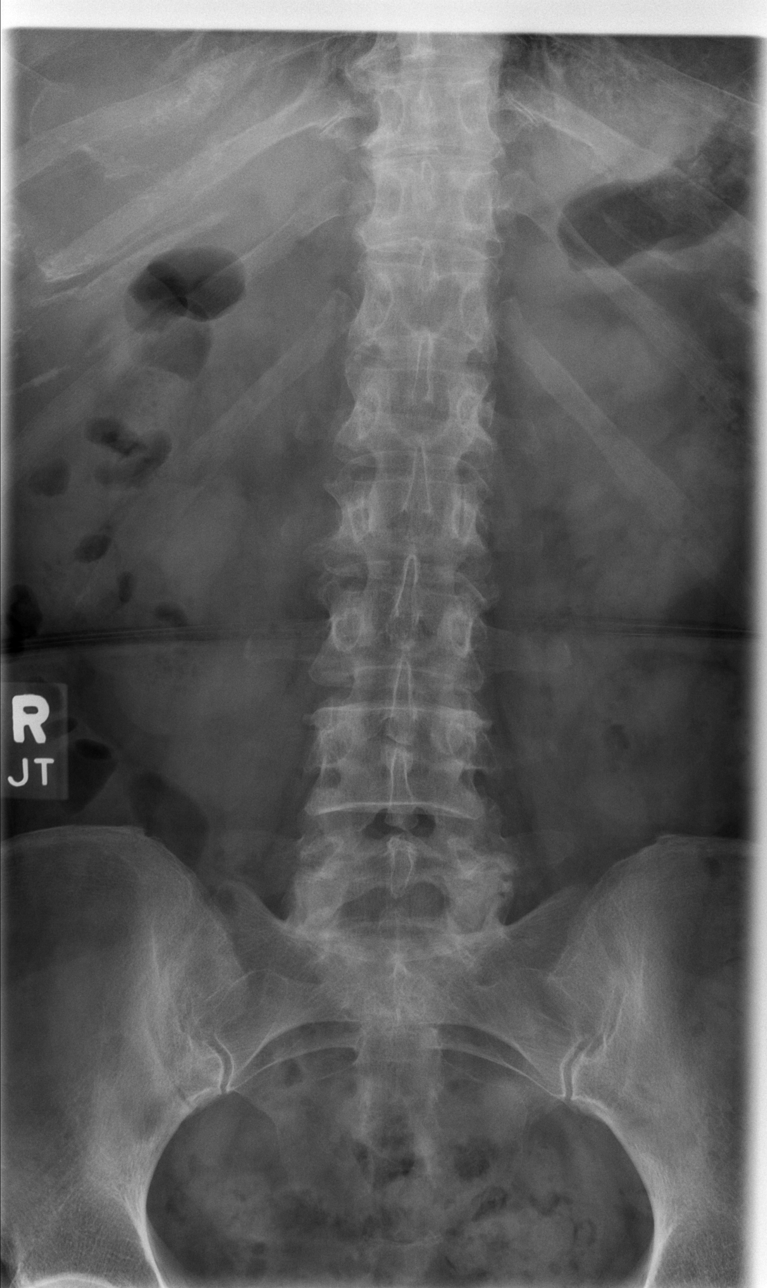

[t l-spine oblique exposure (1 of 2)]
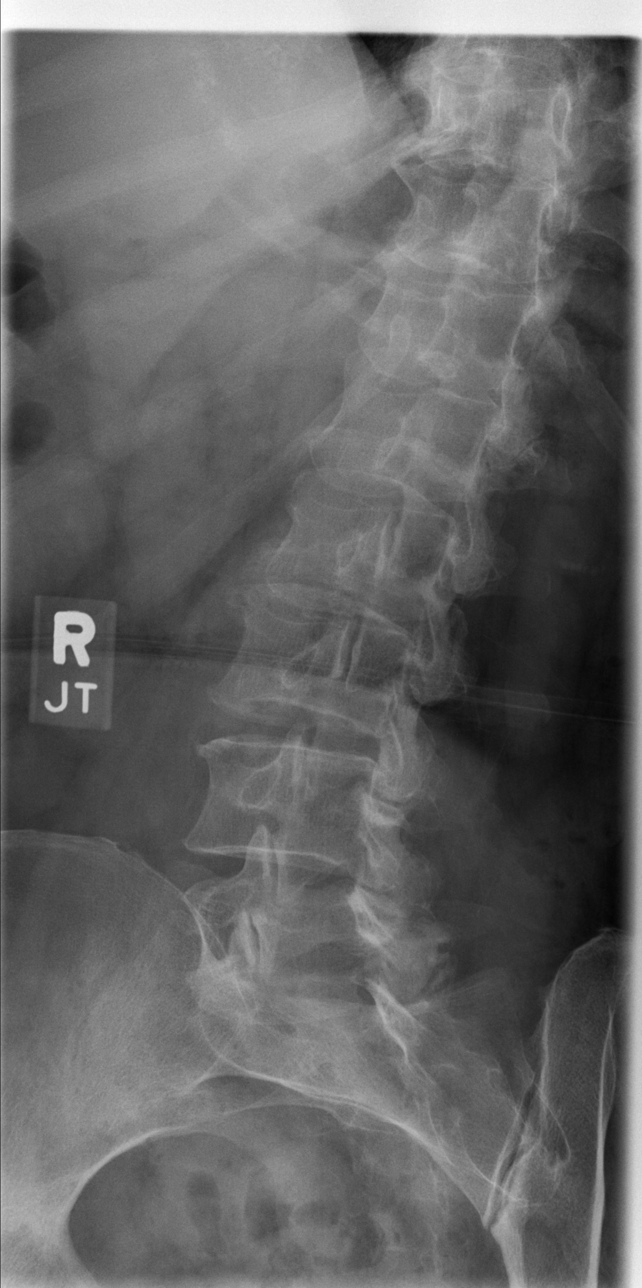

[t l-spine oblique exposure (2 of 2)]
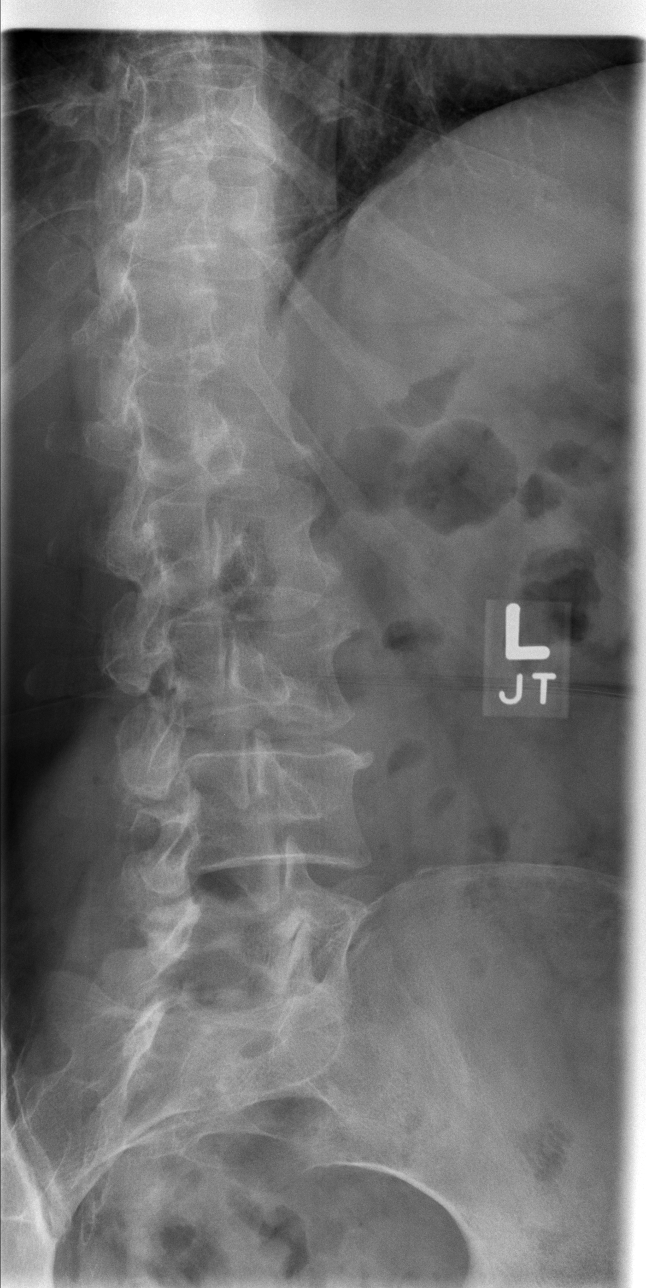

[t l-spine lat]
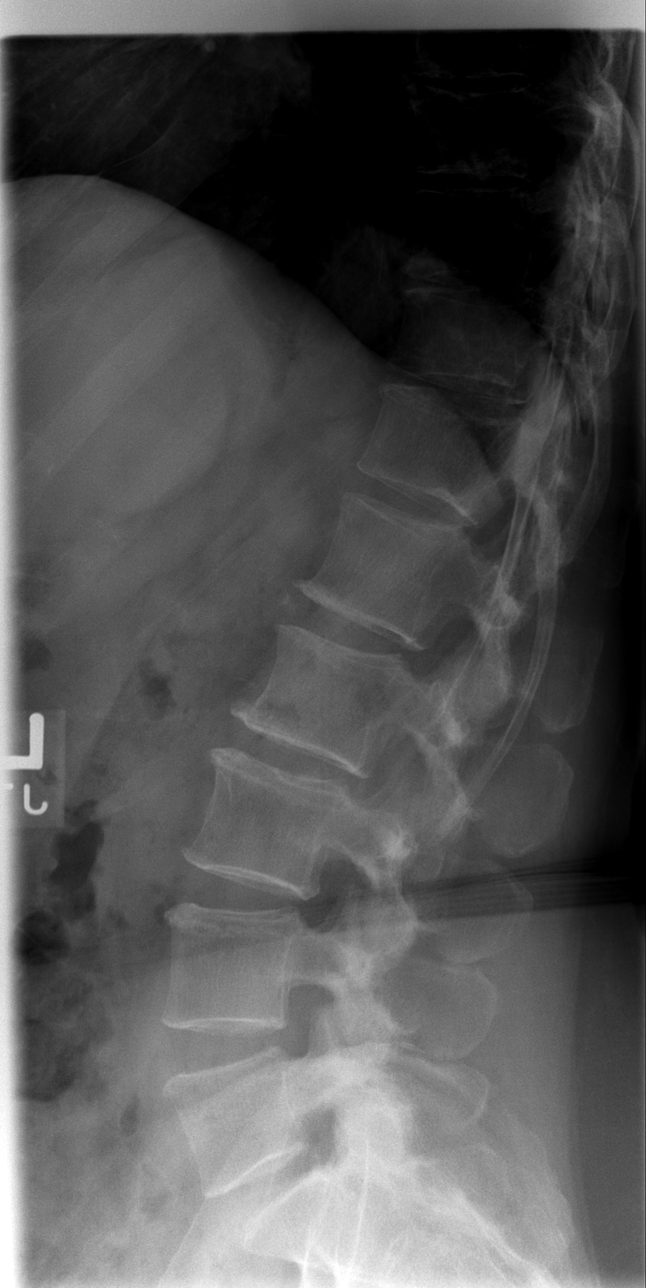

[t l-spine l5-s1 spot]
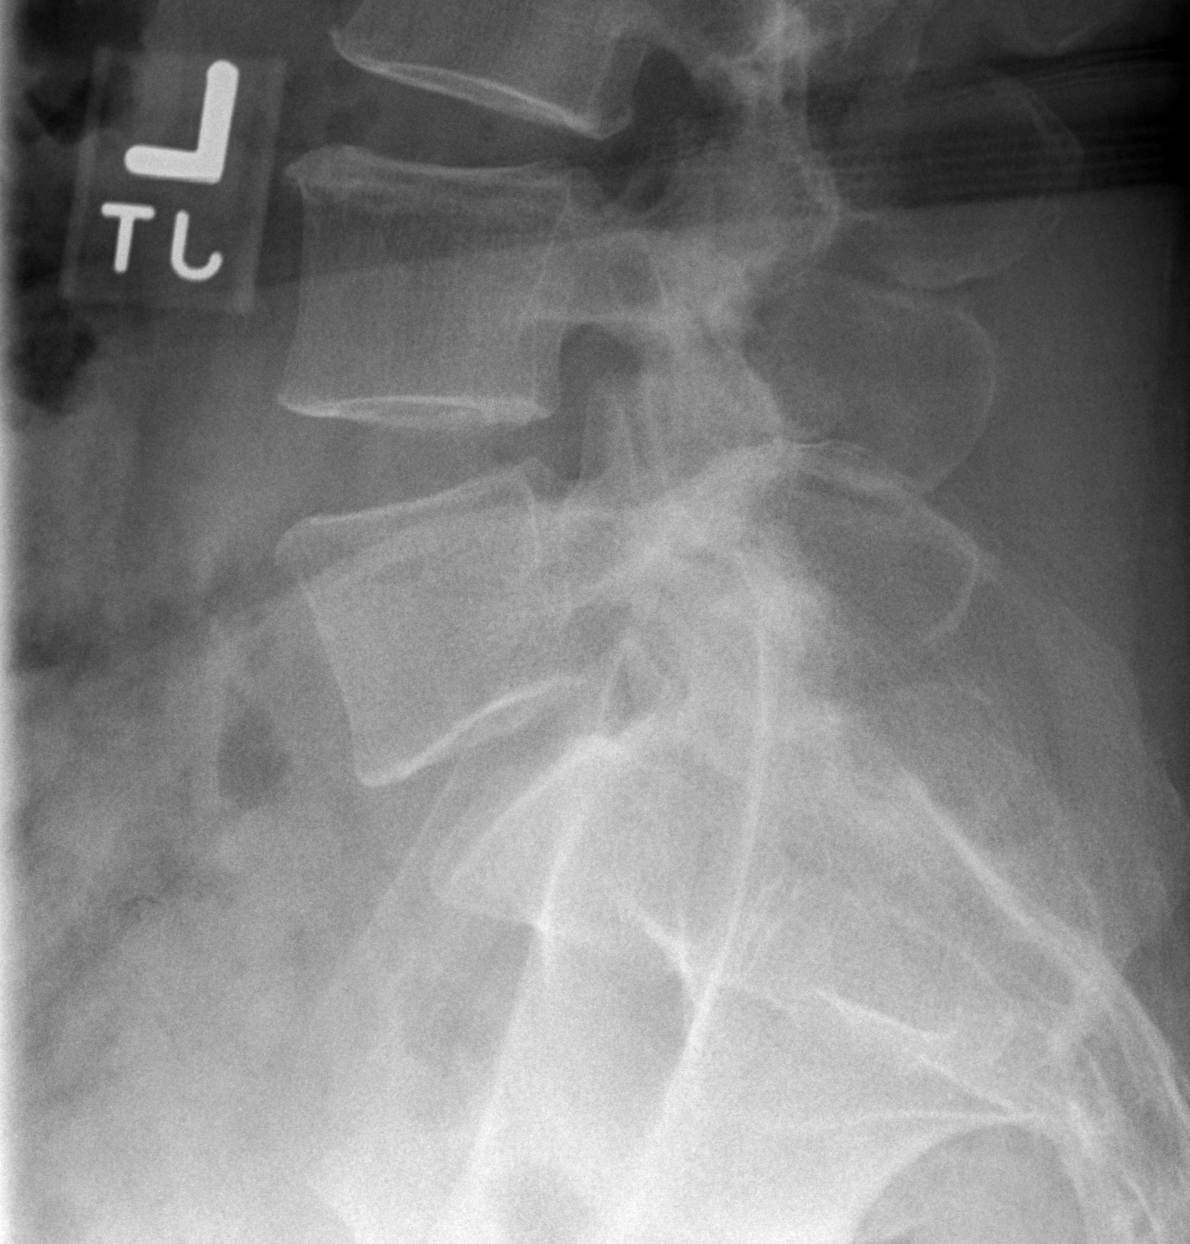

[5 of 5 positions shown; findings below may reference images not displayed]

FINDINGS: There are five non-rib-bearing lumbar vertebrae.  No subluxation or fractures.  There is mild anterior wedging of T11 and T12 that may be due to either old trauma or congenital variation of normal.  This is does not look acute.  
 There is some minor degenerative spurring of the vertebral bodies of L2 through 4.  There is also mild facet hypertrophy and mild degenerative changes of the SI joints.
IMPRESSION: There are mild degenerative changes-no acute abnormality.

## 2008-12-17 IMAGING — CT CT HEAD W/O CM
1 series · 16 of 30 positions shown, 20 images · IV contrast (agent unspecified)
Comparison: None.

CLINICAL DATA: Fell and hit head.  
 HEAD CT WITHOUT CONTRAST:
TECHNIQUE: Contiguous axial images were obtained from the base of the skull through the vertex according to standard protocol without contrast.

[Series 2: headseq 4.8 h45s · axial · 0.43mm/px · z∈[-114,+14]mm · 16 of 30 slices shown, 20 images]
[im 2/30  brain]
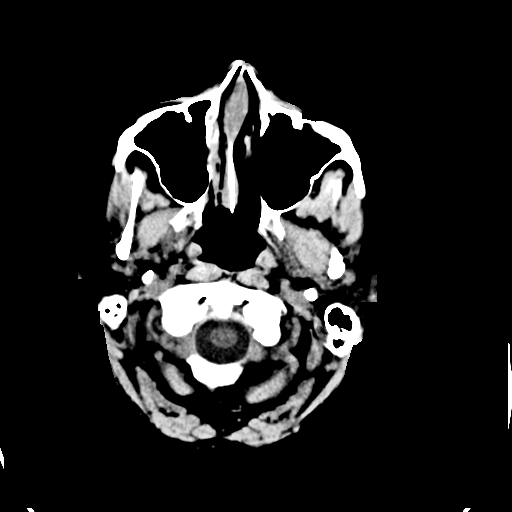
[im 2/30  bone]
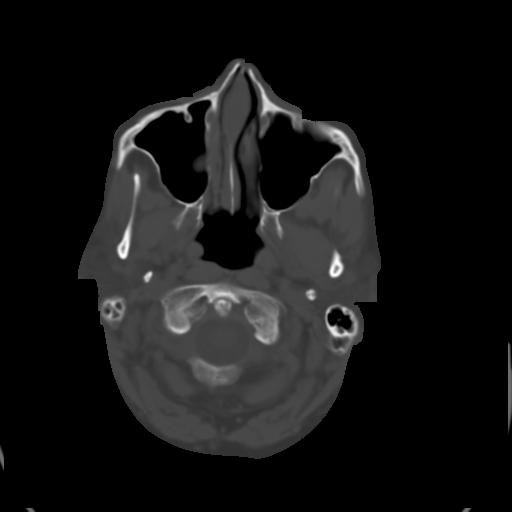
[im 4/30  brain]
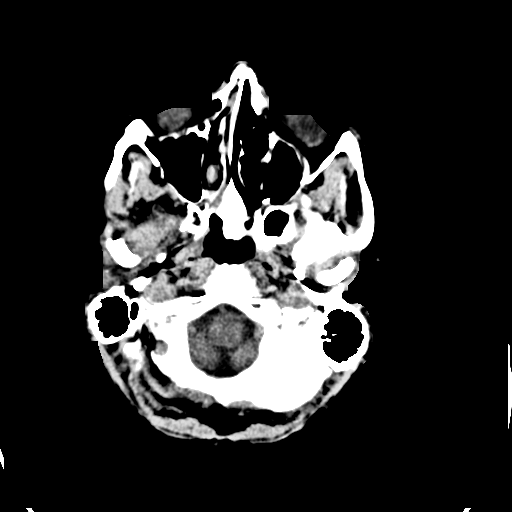
[im 6/30  brain]
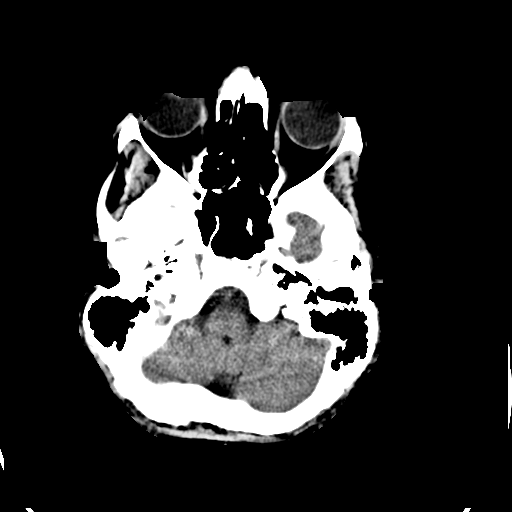
[im 8/30  brain]
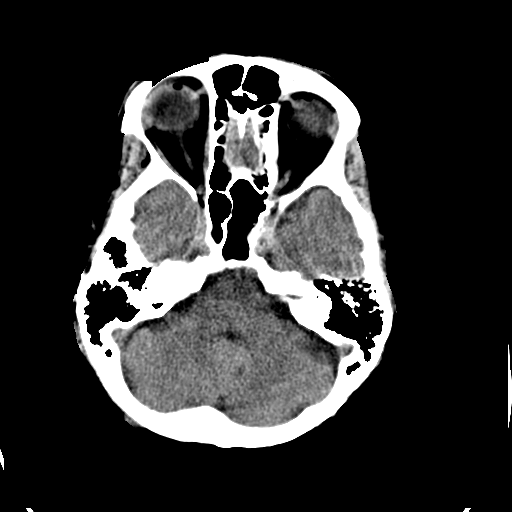
[im 9/30  brain]
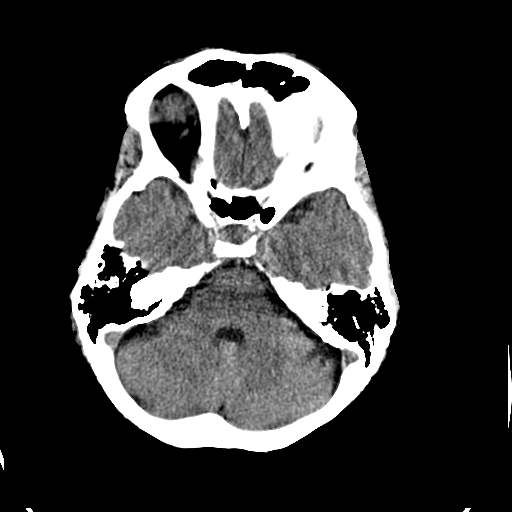
[im 9/30  bone]
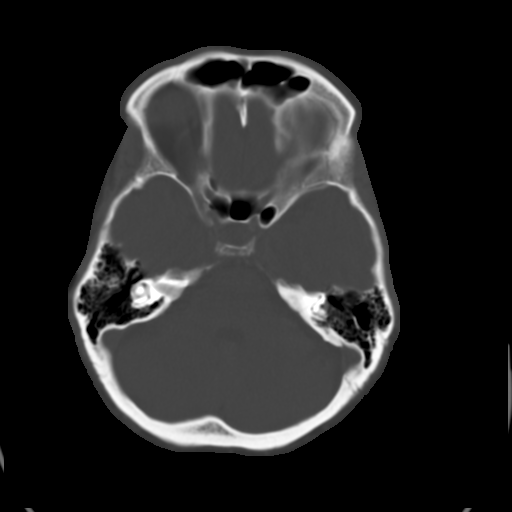
[im 11/30  brain]
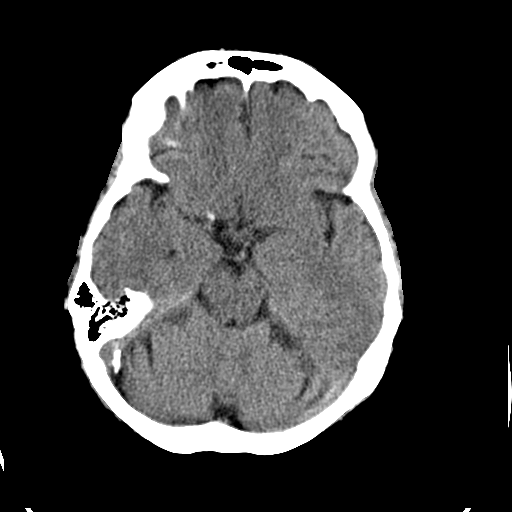
[im 13/30  brain]
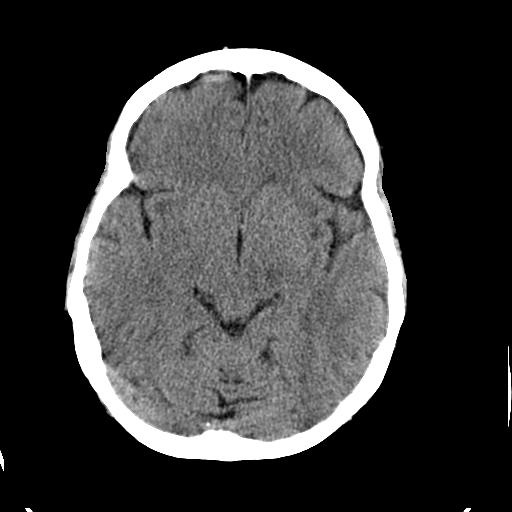
[im 15/30  brain]
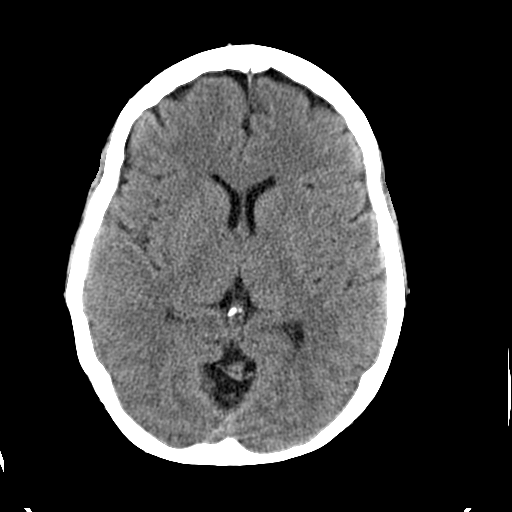
[im 16/30  brain]
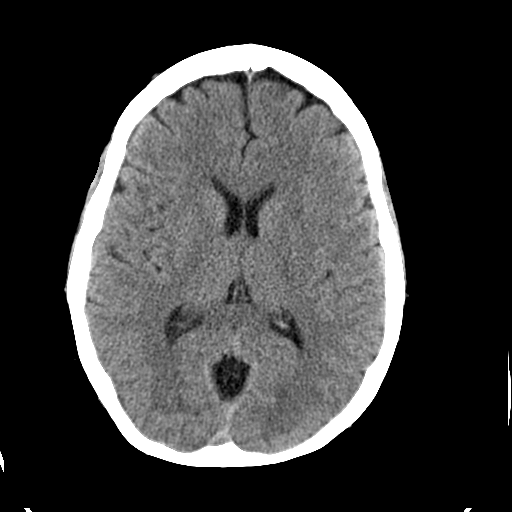
[im 16/30  bone]
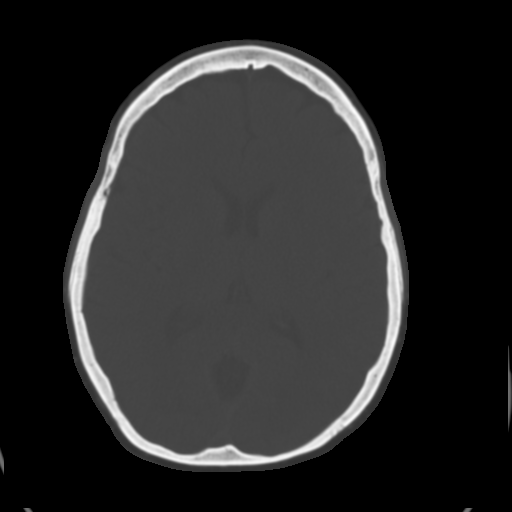
[im 18/30  brain]
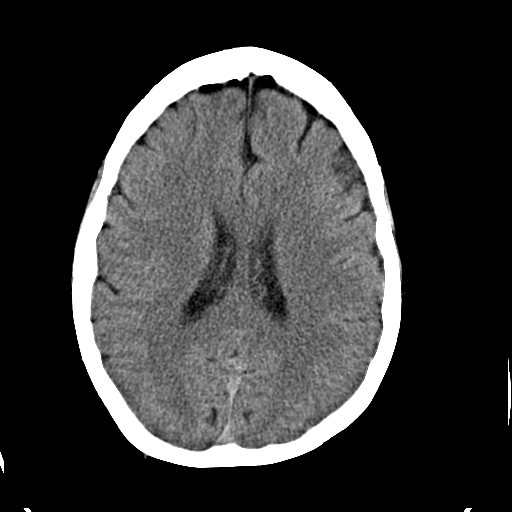
[im 20/30  brain]
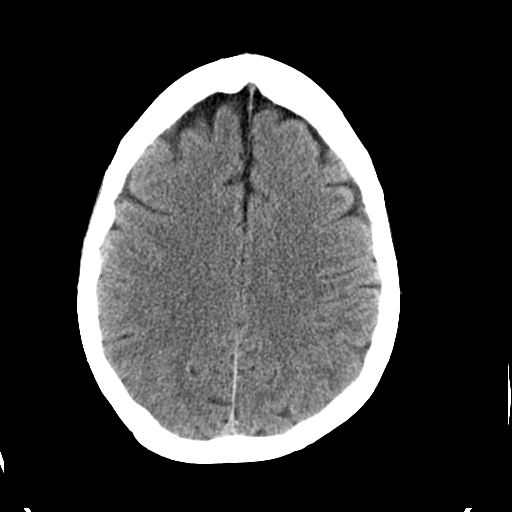
[im 22/30  brain]
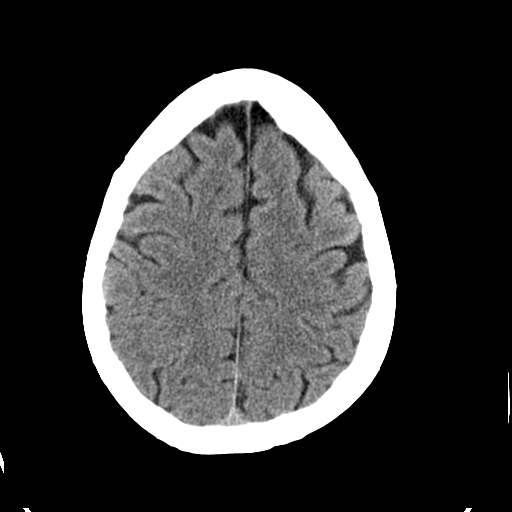
[im 23/30  brain]
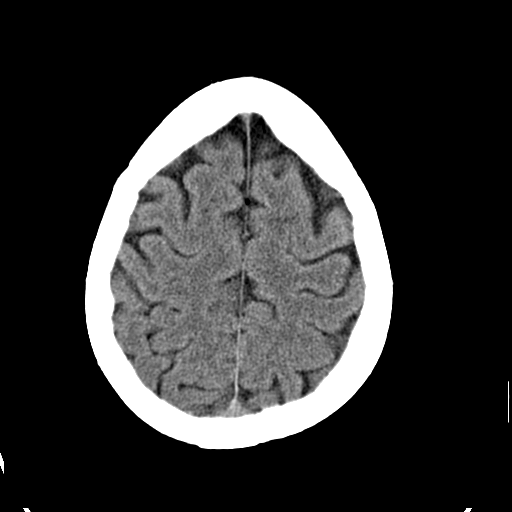
[im 23/30  bone]
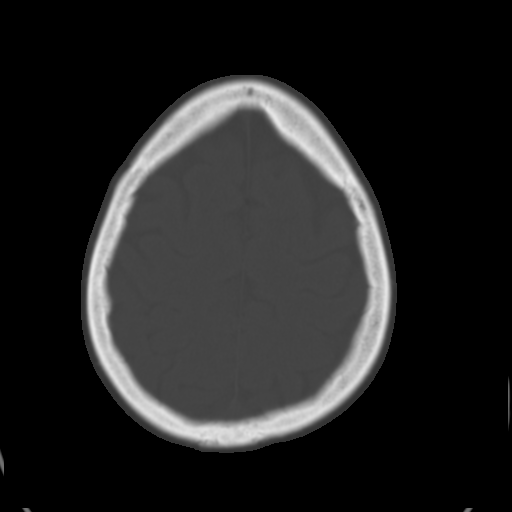
[im 25/30  brain]
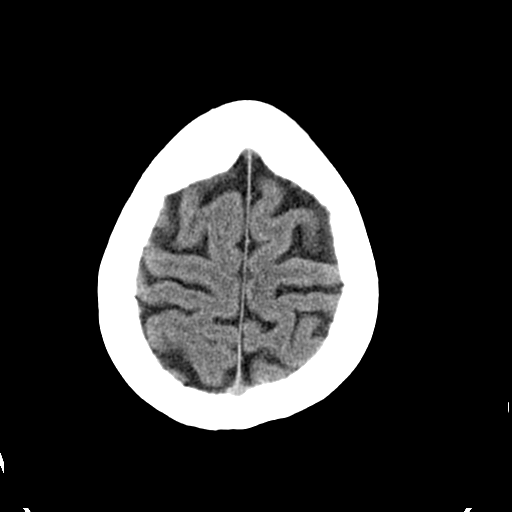
[im 27/30  brain]
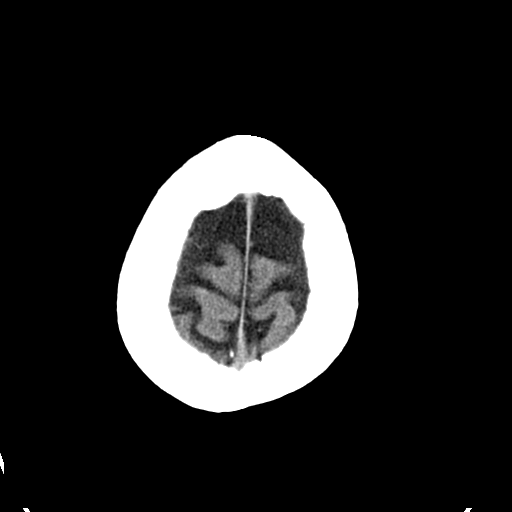
[im 29/30  brain]
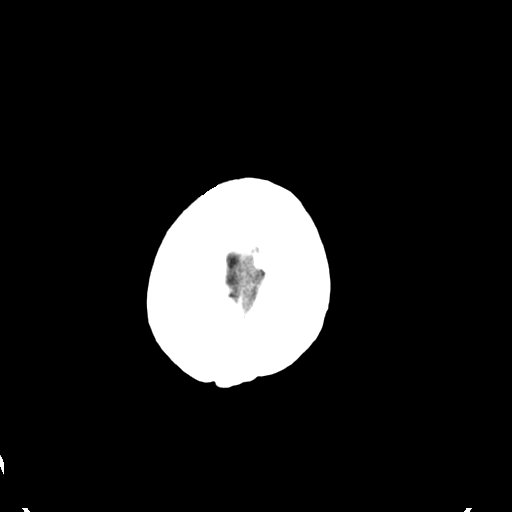

[16 of 30 positions shown; findings below may reference images not displayed]

FINDINGS: There is no evidence of intracranial hemorrhage, brain edema, acute infarct, mass lesion, or mass effect.  No other intra-axial abnormalities are seen, and the ventricles are within normal limits.  No abnormal extra-axial fluid collections or masses are identified.  No skull abnormalities are noted.
IMPRESSION: Negative non-contrast head CT.

## 2008-12-18 ENCOUNTER — Emergency Department (HOSPITAL_COMMUNITY): Admission: EM | Admit: 2008-12-18 | Discharge: 2008-12-18 | Payer: Self-pay | Admitting: Emergency Medicine

## 2008-12-19 IMAGING — CR DG SACRUM/COCCYX 2+V
3 series · 3 of 3 positions shown · non-contrast
Comparison: none

CLINICAL DATA: 55-year-old with coccyx pain and posterior buttock pain. Patient fell down stairs [REDACTED] and landed on buttocks. 
 SACRUM/COCCYX - 3 VIEW:

[t sacrum a.p.]
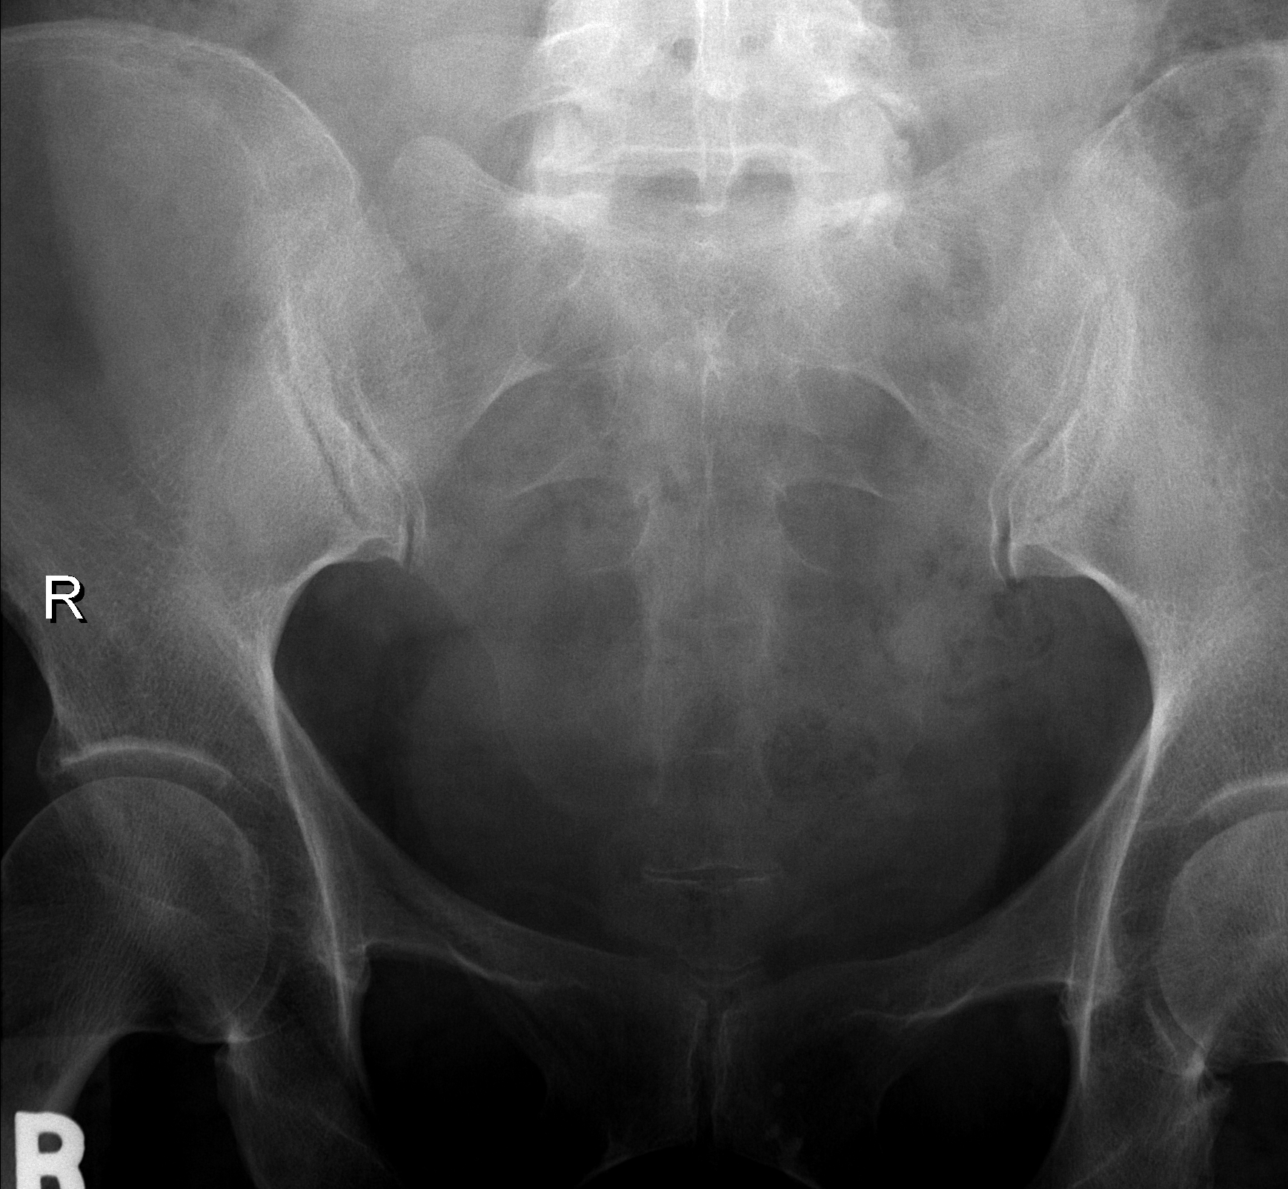

[t coccyx a.p.]
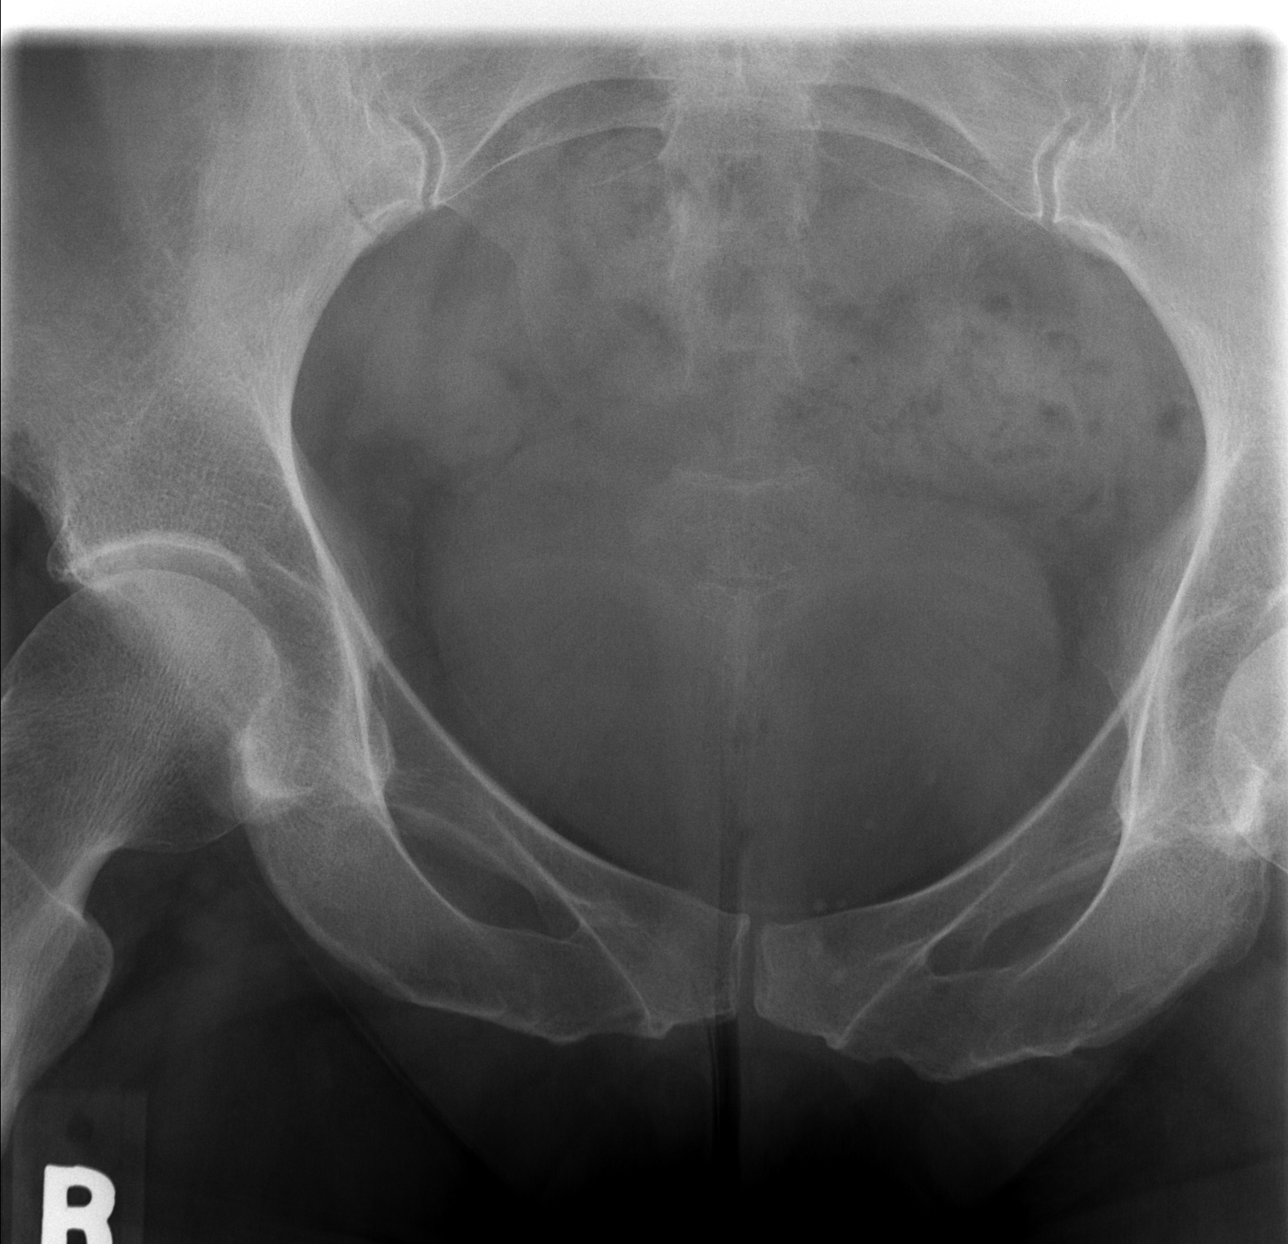

[t sacrum lat]
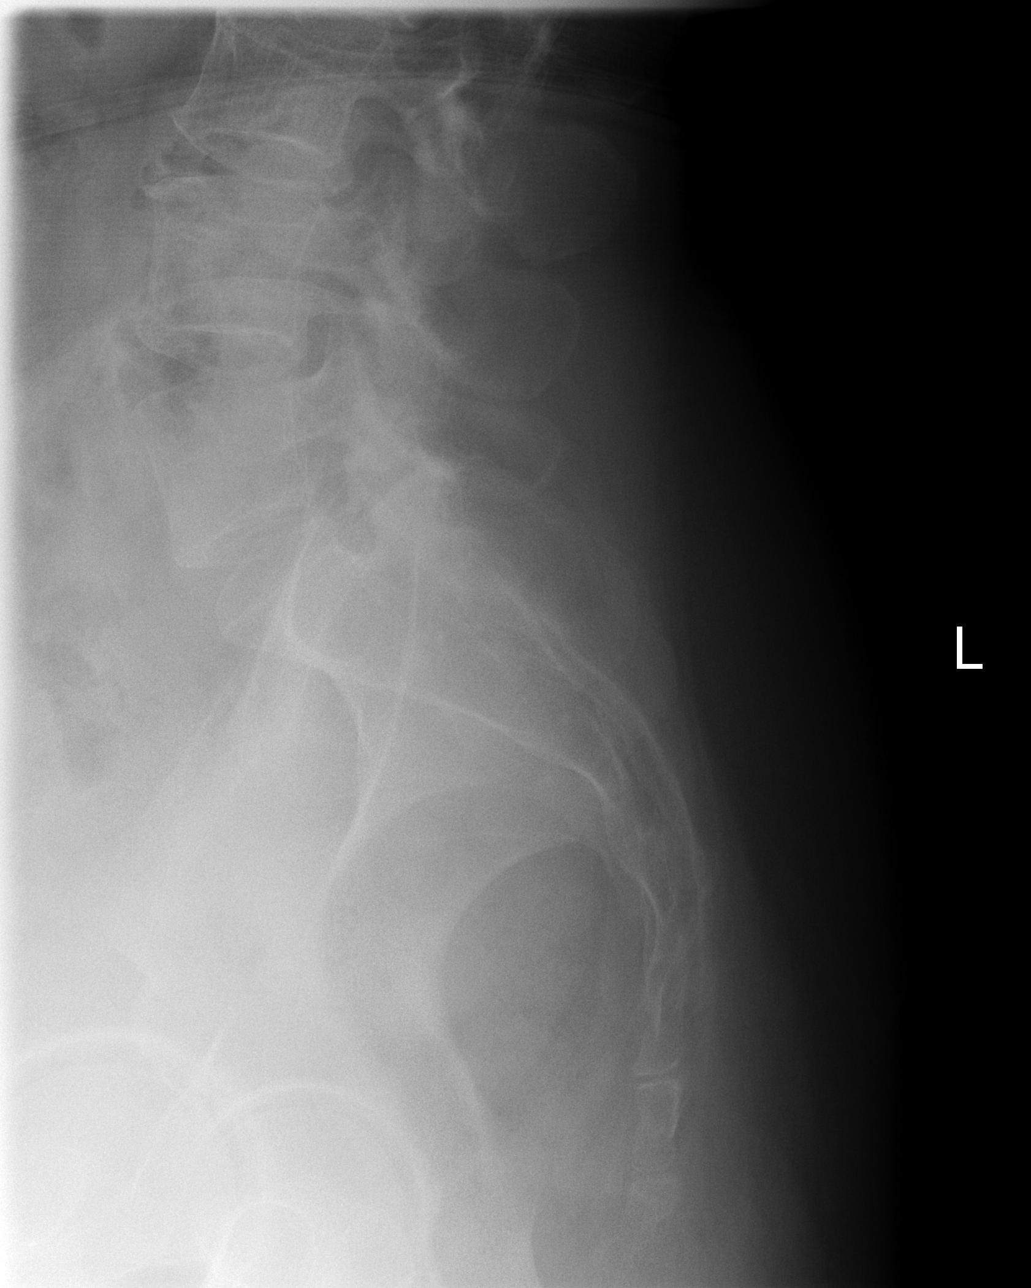

[3 of 3 positions shown; findings below may reference images not displayed]

FINDINGS: There is no evidence of fracture or other focal bone lesions.
IMPRESSION: Negative.

## 2008-12-29 IMAGING — CR DG LUMBAR SPINE COMPLETE 4+V
3 series · 3 of 3 positions shown · non-contrast
Comparison: none

CLINICAL DATA: Fell in bathtub [DATE]. Dull pain when sitting down.
LUMBAR SPINE - 2 VIEW:

[view not recorded (1 of 3)]
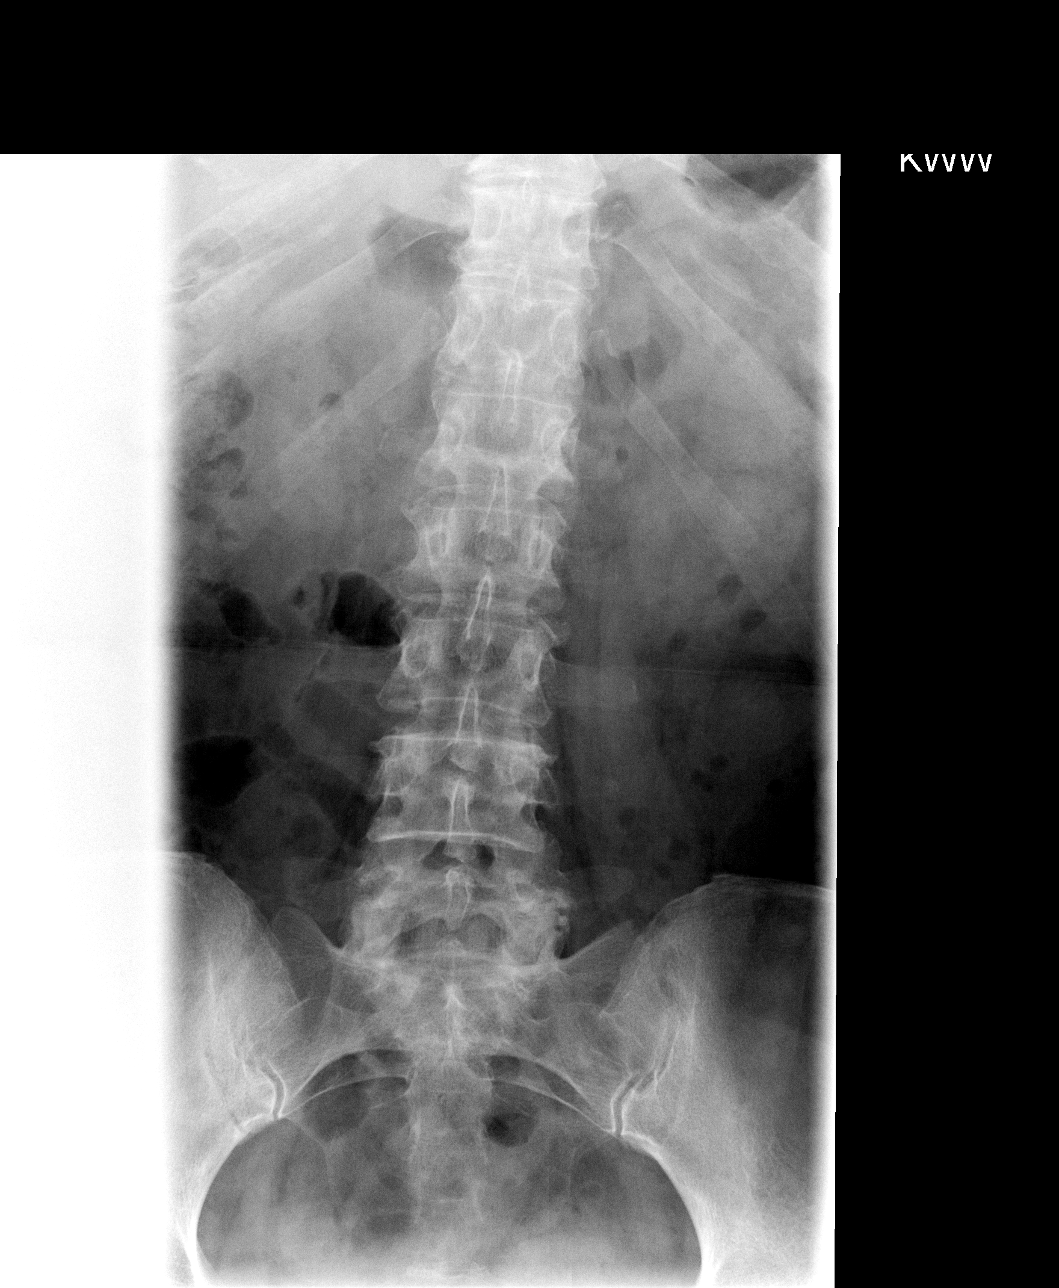

[view not recorded (2 of 3)]
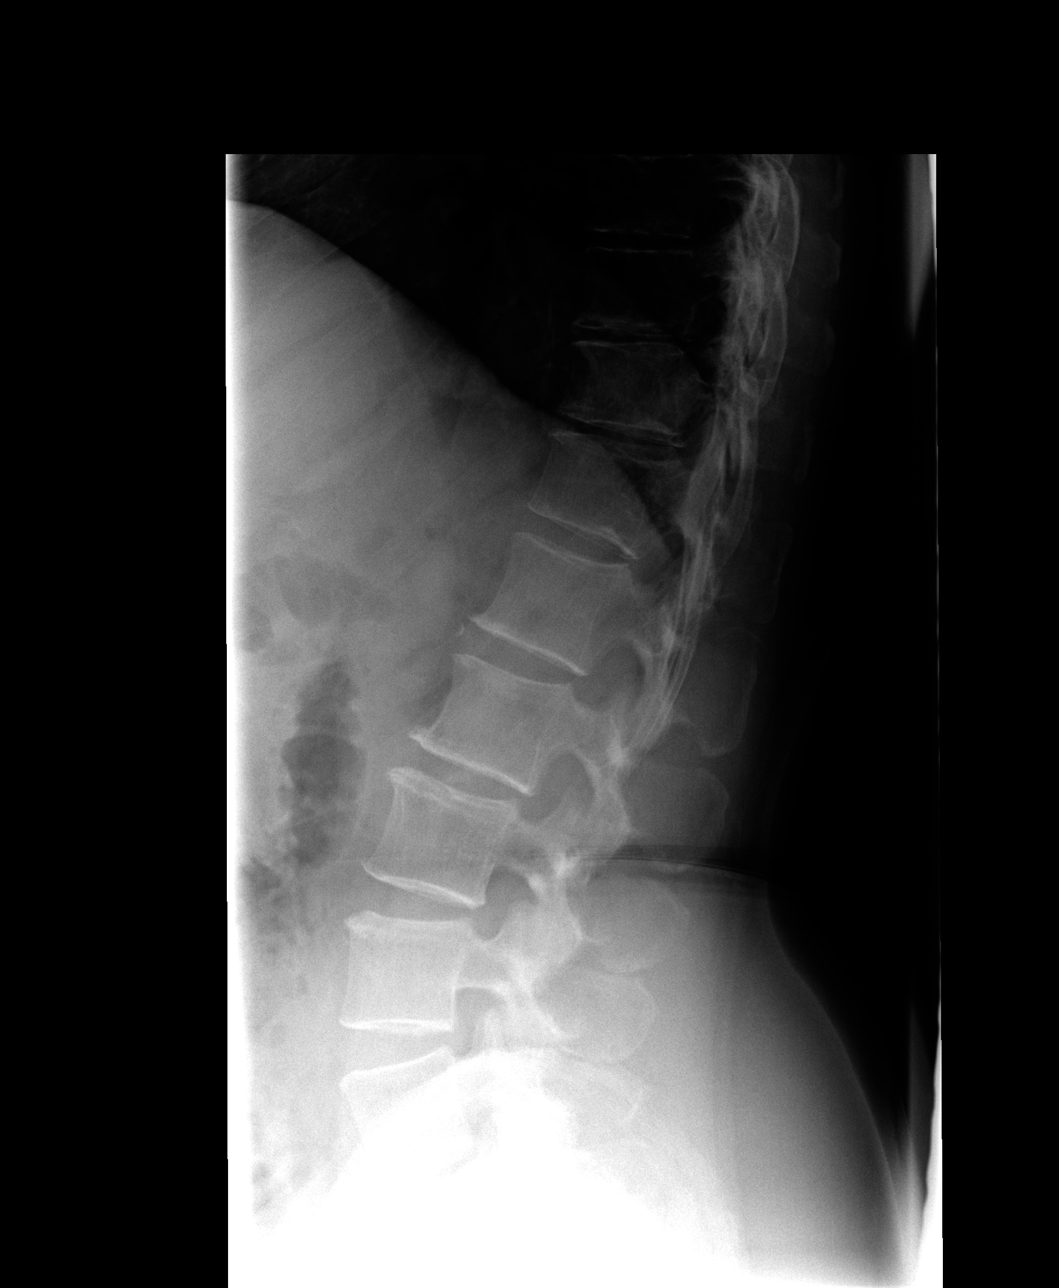

[view not recorded (3 of 3)]
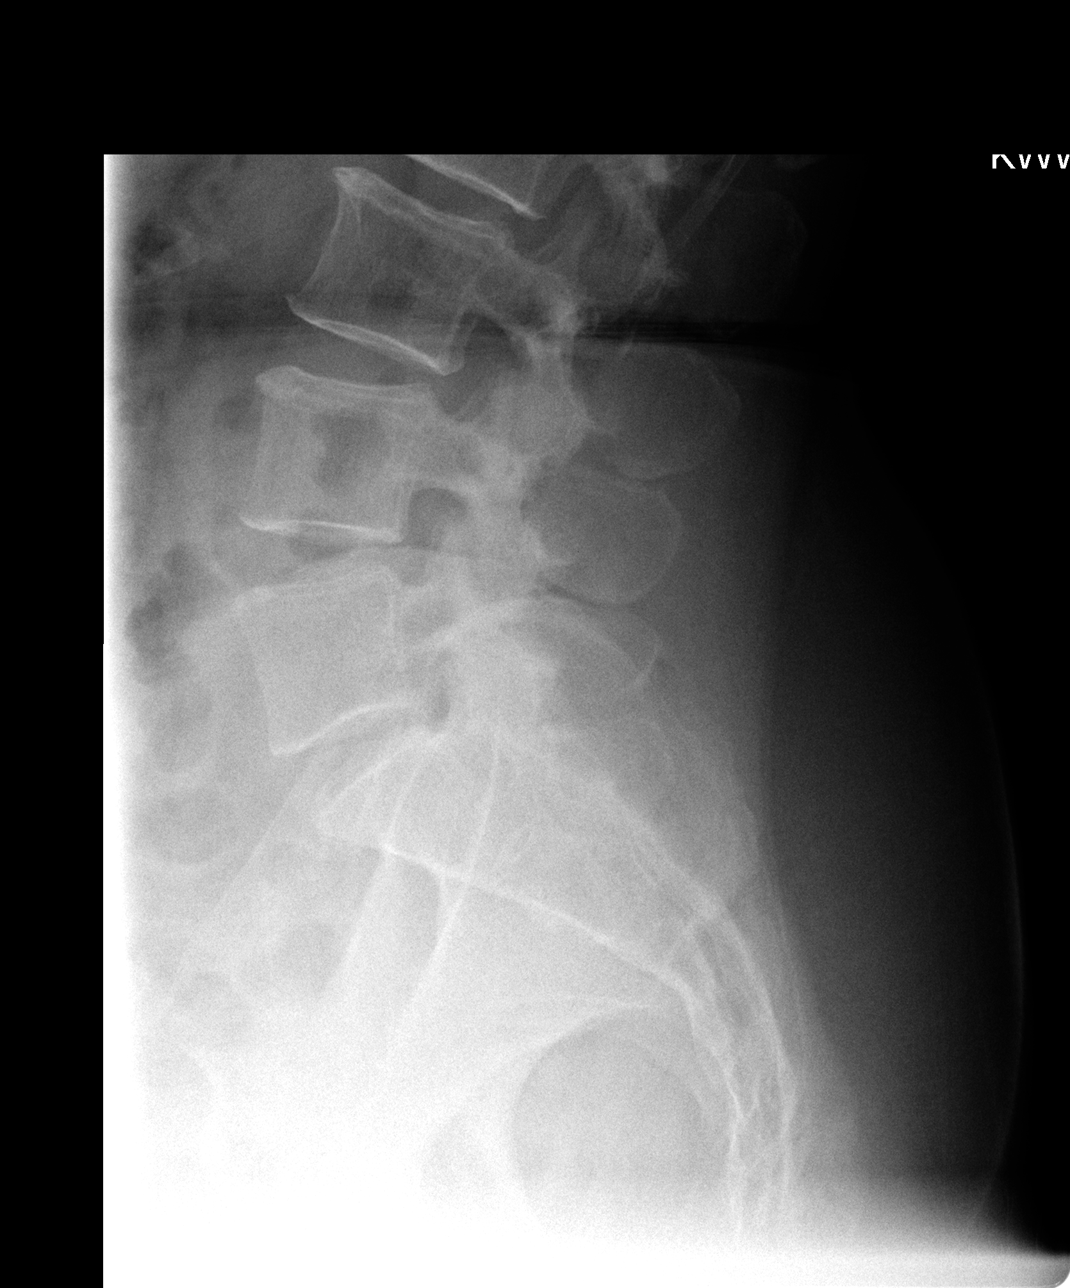

[3 of 3 positions shown; findings below may reference images not displayed]

FINDINGS: Very mild anterior wedging of the T-12 vertebra with minimal acute angulation anterior superior aspect of the vertebra. This may be degenerative in origin although subtle fracture cannot be excluded. MR could be obtained for further delineation if clinically desired. A call is in to Dr. Nya.
IMPRESSION: Subtle superior endplate compression deformity of T-12 vertebra cannot be excluded.  Follow-up MR recommended for further delineation if clinically desired.

## 2009-02-08 ENCOUNTER — Ambulatory Visit: Payer: Self-pay | Admitting: Family Medicine

## 2009-03-23 IMAGING — CT CT ABDOMEN W/ CM
1 of 2 series · 15 of 32 positions shown, 19 images · IV contrast (omnipaque)
Comparison: 04/13/06.

CLINICAL DATA: Low back pain.
 ABDOMEN CT WITH CONTRAST ? 02/23/07 AT 9699 HOURS:
TECHNIQUE: Multidetector CT imaging of the abdomen was performed following the standard protocol during bolus administration of intravenous contrast. 
 Contrast:  125 cc Omnipaque 300 IV.
TECHNIQUE: Multidetector CT imaging of the pelvis was performed following the standard protocol during bolus administration of intravenous contrast.

[Series 2: abd_pel 5.0 b40f st · axial · 0.69mm/px · z∈[-478,-42]mm · 15 of 97 slices shown, 19 images]
[im 5/97  soft-tissue]
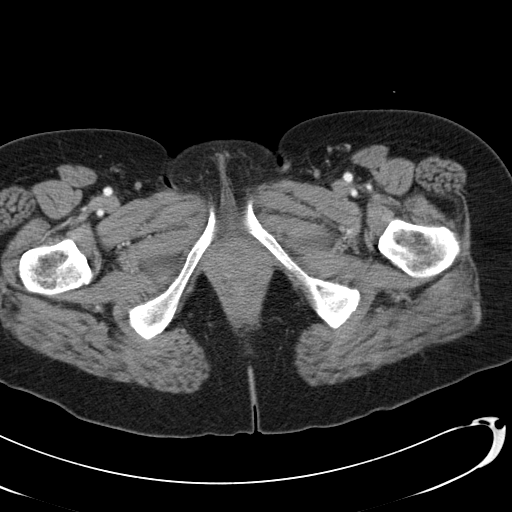
[im 5/97  bone]
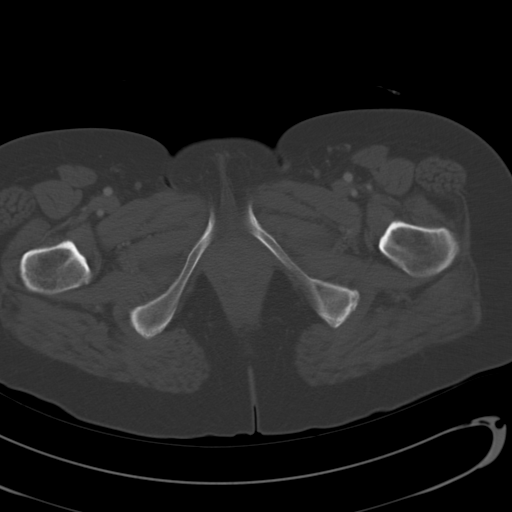
[im 13/97  soft-tissue]
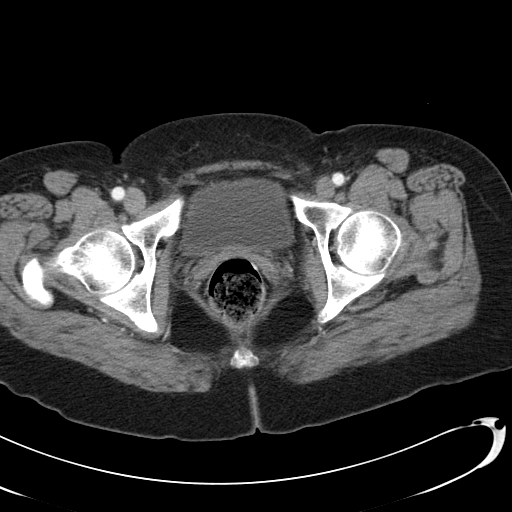
[im 21/97  soft-tissue]
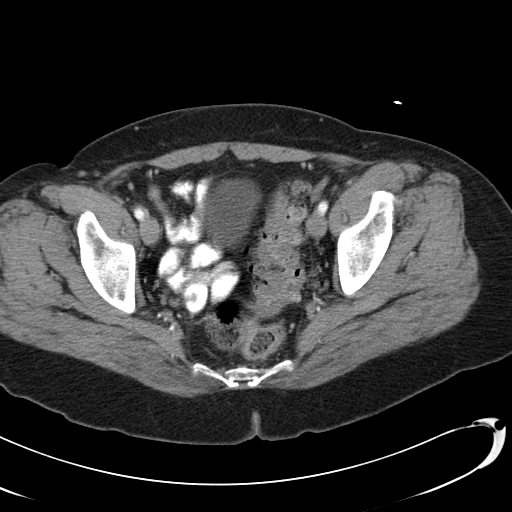
[im 26/97  soft-tissue]
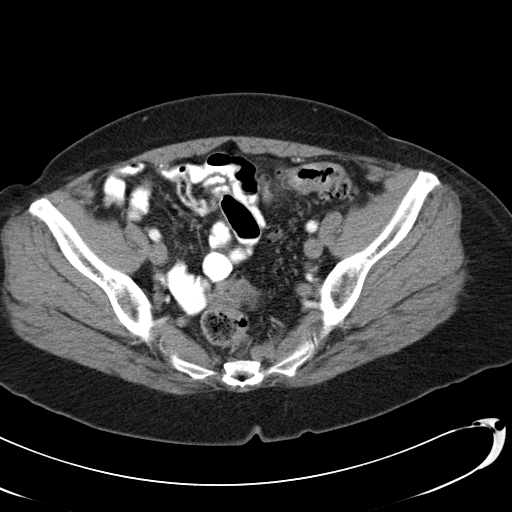
[im 34/97  soft-tissue]
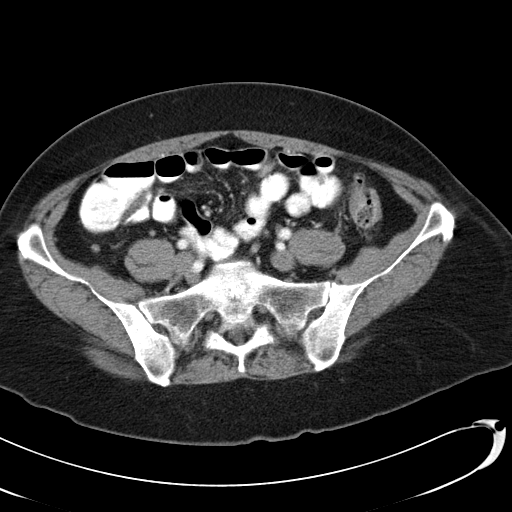
[im 42/97  soft-tissue]
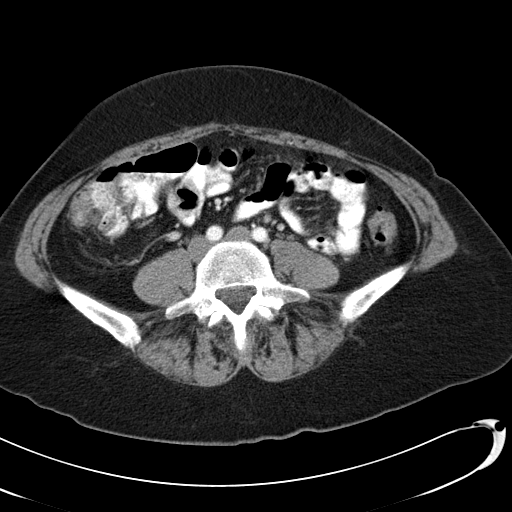
[im 51/97  soft-tissue]
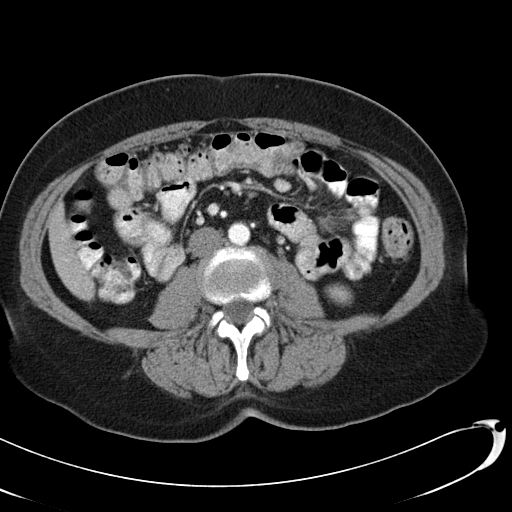
[im 55/97  soft-tissue]
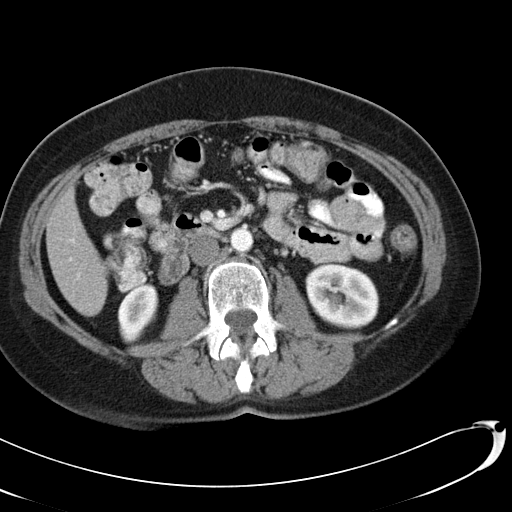
[im 63/97  soft-tissue]
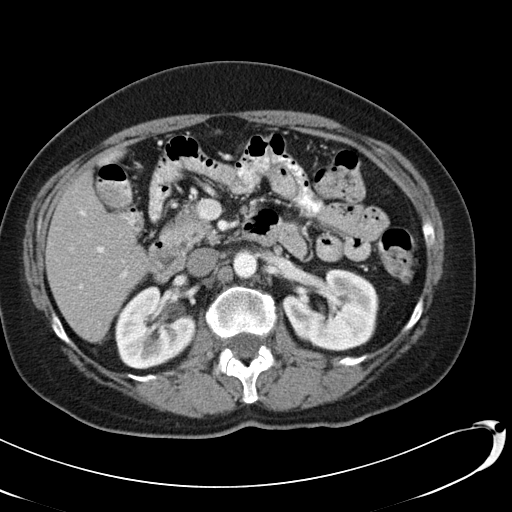
[im 63/97  bone]
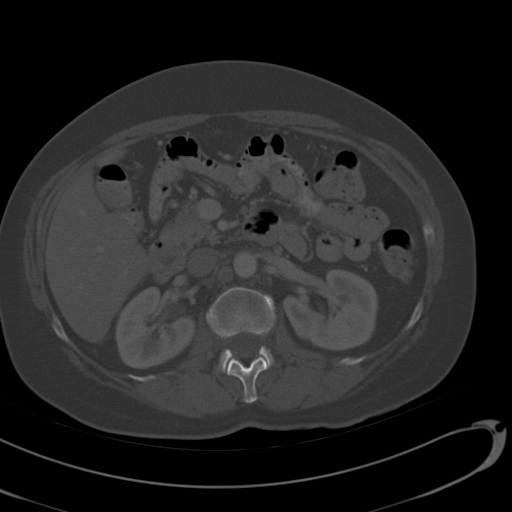
[im 71/97  soft-tissue]
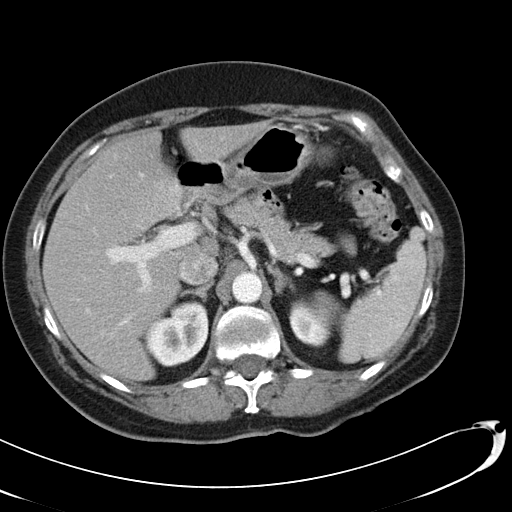
[im 76/97  soft-tissue]
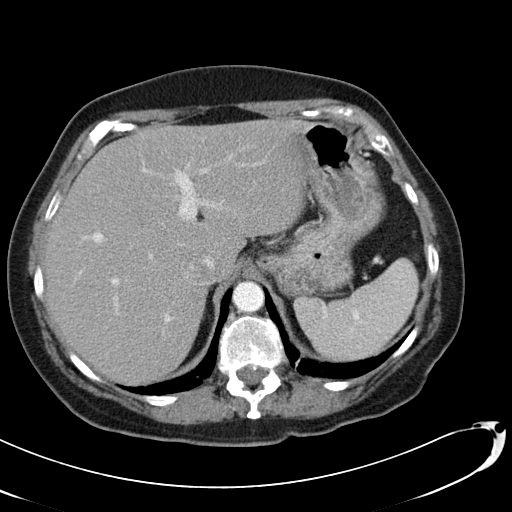
[im 80/97  lung]
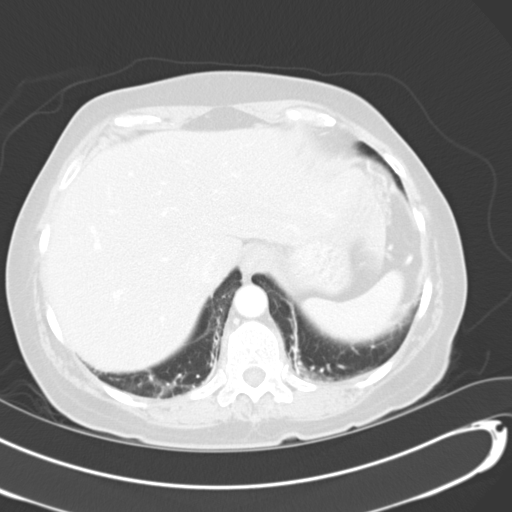
[im 84/97  soft-tissue]
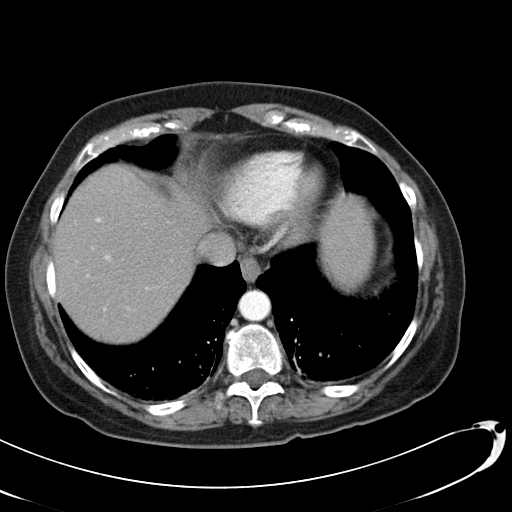
[im 84/97  lung]
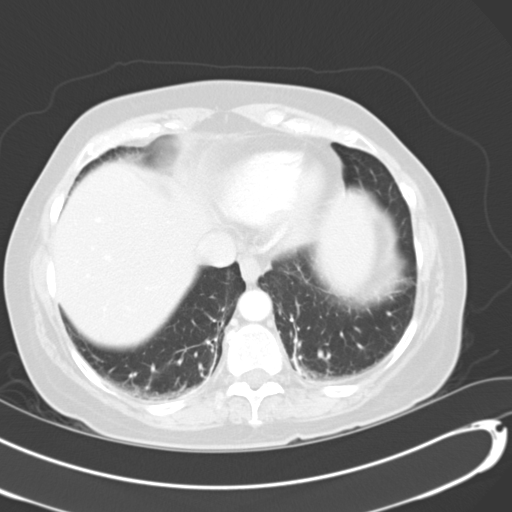
[im 88/97  lung]
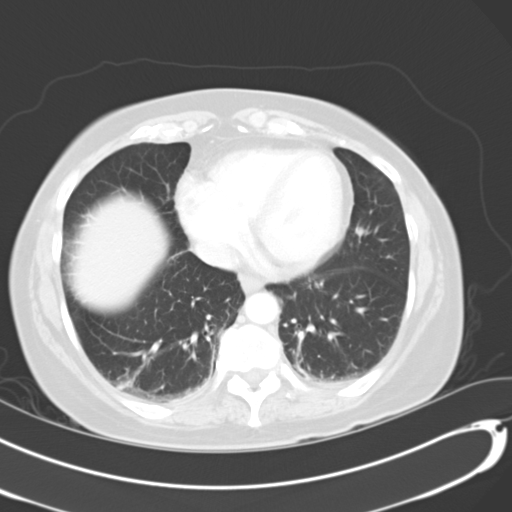
[im 92/97  soft-tissue]
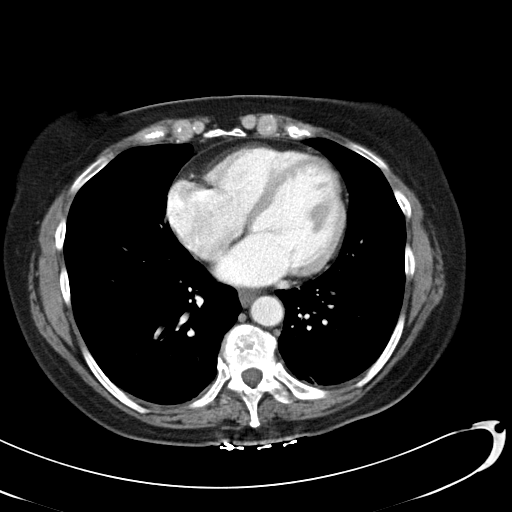
[im 92/97  lung]
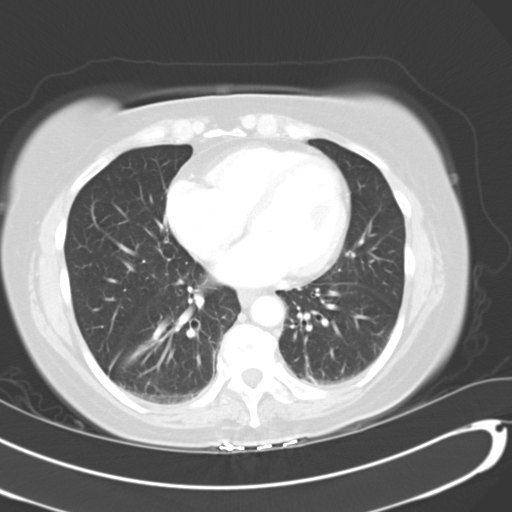

[15 of 32 positions shown; findings below may reference images not displayed]

FINDINGS: Mild bibasilar atelectasis is present.  The liver, gallbladder, spleen, pancreas, and adrenal glands are unremarkable.  The kidneys are within normal limits.  Negative free fluid or abnormal adenopathy.  Diverticulosis of the descending colon is present without diverticulitis.  
 A minimal T11-T12 calcified disk herniation is unchanged.
IMPRESSION: No acute intra-abdominal pathology.  Stable exam.  Stable small T11-T12 disk herniation
 PELVIS CT WITH CONTRAST ? 02/23/07 AT 9699 HOURS:
FINDINGS: The appendix is normal.  Sigmoid diverticulosis without evidence of diverticulitis is present.  Degenerative changes in the facet joints of the lumbar spine are present.  Negative free fluid.  The bladder is unremarkable. The uterus is surgically absent.  Adnexa are within normal limits.  Mild right L5-S1 foraminal narrowing due to disk-osteophytes is noted.
IMPRESSION: No acute intrapelvic pathology.  Sigmoid diverticulosis without diverticulitis.

## 2009-04-06 ENCOUNTER — Emergency Department (HOSPITAL_COMMUNITY): Admission: EM | Admit: 2009-04-06 | Discharge: 2009-04-06 | Payer: Self-pay | Admitting: Emergency Medicine

## 2009-04-07 IMAGING — MR MR LUMBAR SPINE W/O CM
4 of 6 series · 18 of 48 positions shown · non-contrast
Comparison: Lumbar spine plain films of 12/01/06.

CLINICAL DATA: 55 year old with back pain and bilateral leg pain and numbness.
 MRI LUMBAR SPINE WITHOUT CONTRAST ? 03/10/07:
TECHNIQUE: Multiplanar and multiecho pulse sequences of the lumbar spine, to include the lower thoracic and upper sacral regions, were obtained according to standard protocol without IV contrast.

[Series 3: T1 · sagittal · 4.0mm · 0.51mm/px · 3 of 12 slices shown (1 of 2)]
[im 2/12]
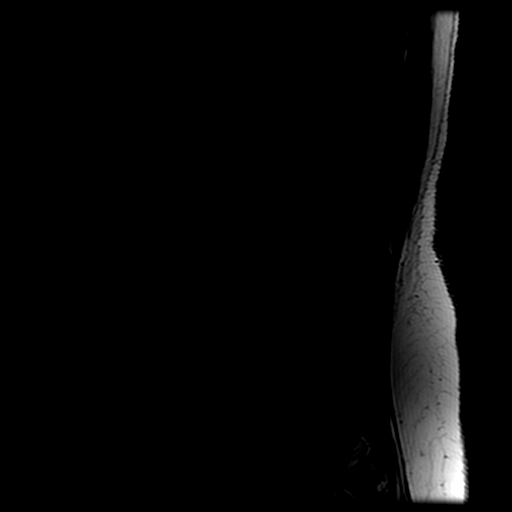
[im 6/12]
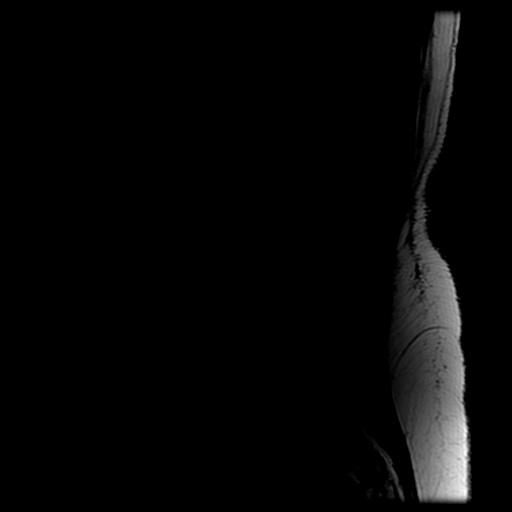
[im 10/12]
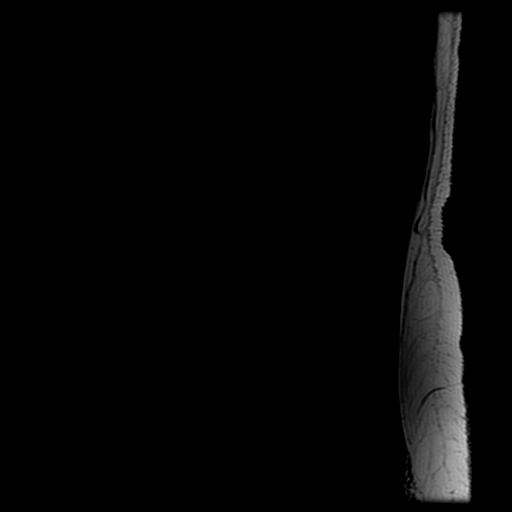

[Series 4: T2 · sagittal · 4.0mm · 0.51mm/px · 6 of 12 slices shown (1 of 2)]
[im 1/12]
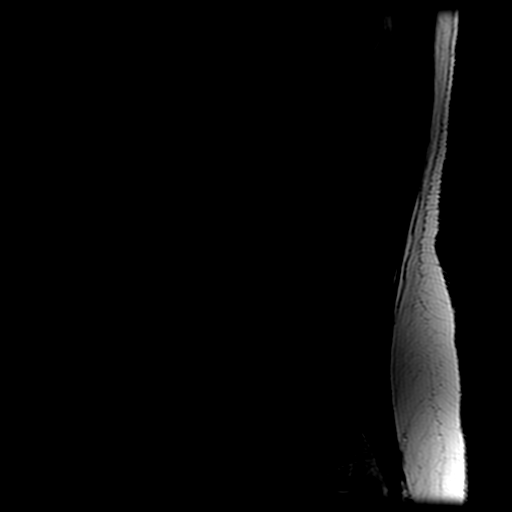
[im 3/12]
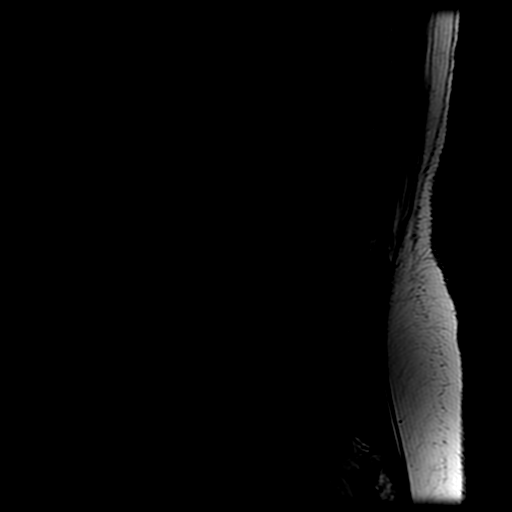
[im 5/12]
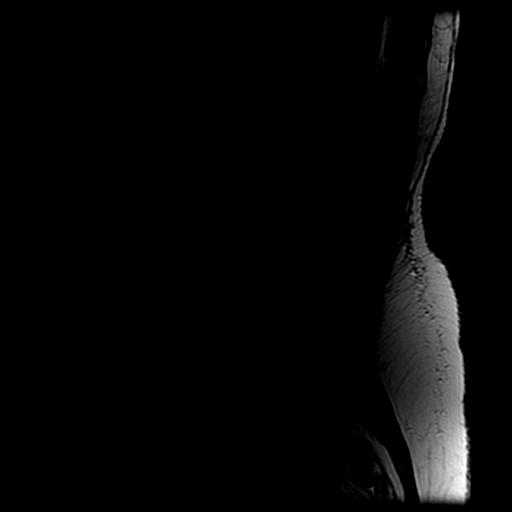
[im 7/12]
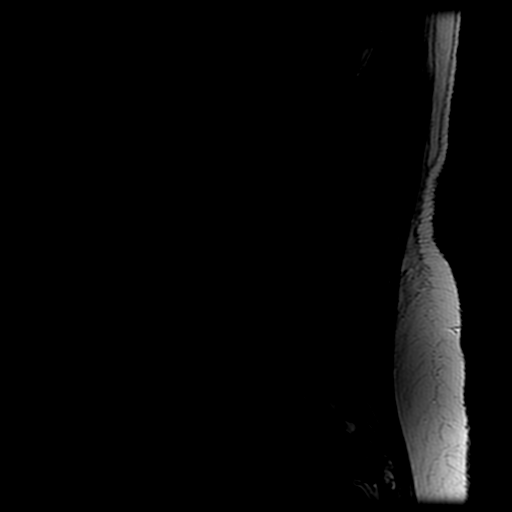
[im 9/12]
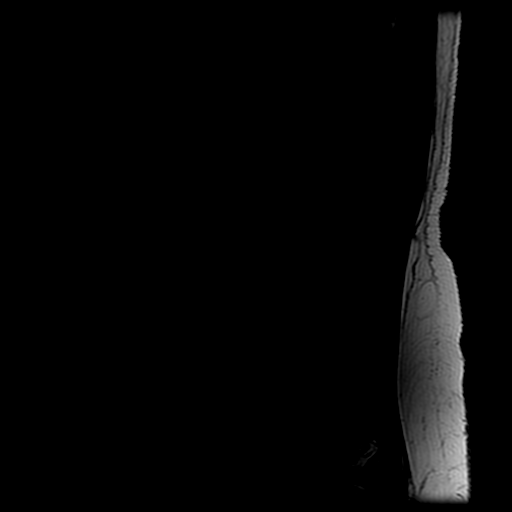
[im 12/12]
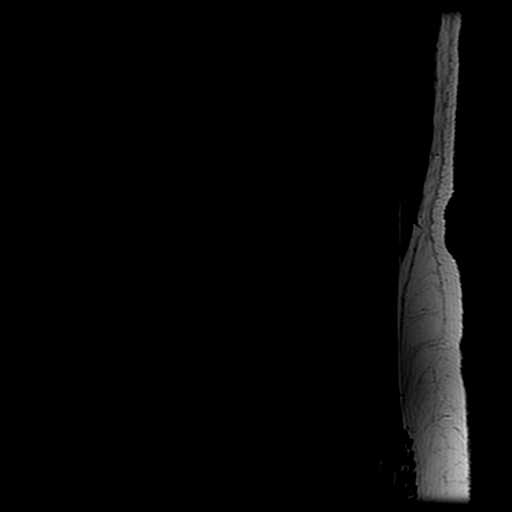

[Series 6: T2 · axial · 4.0mm · 0.35mm/px · z∈[-81,+112]mm · 6 of 22 slices shown (2 of 2)]
[im 1/22]
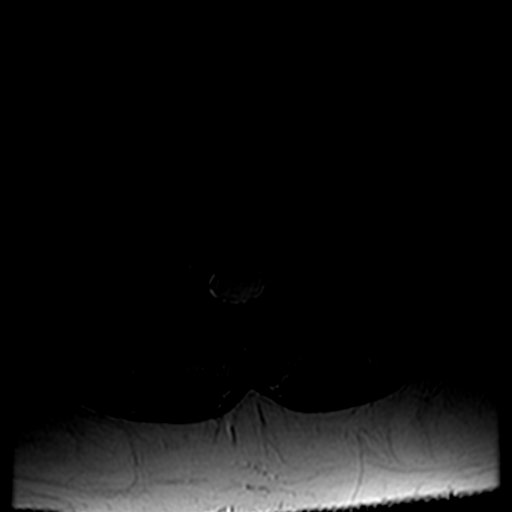
[im 3/22]
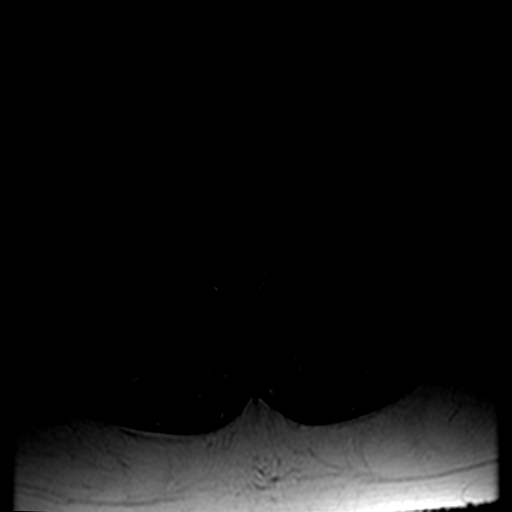
[im 5/22]
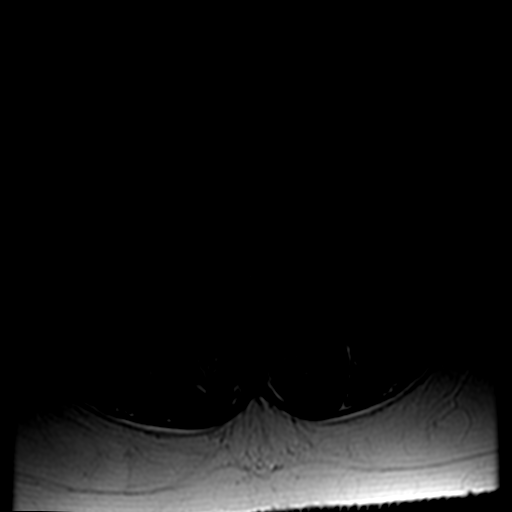
[im 7/22]
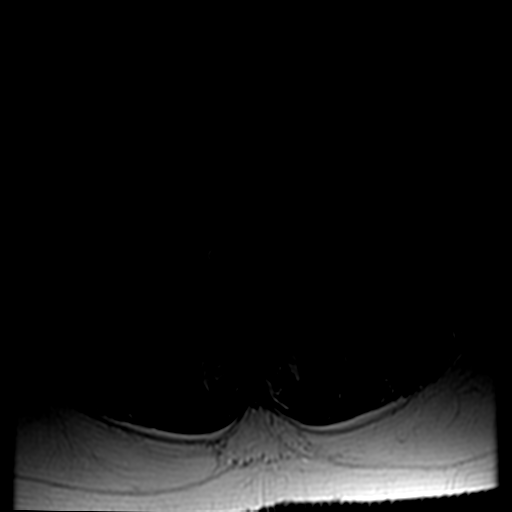
[im 11/22]
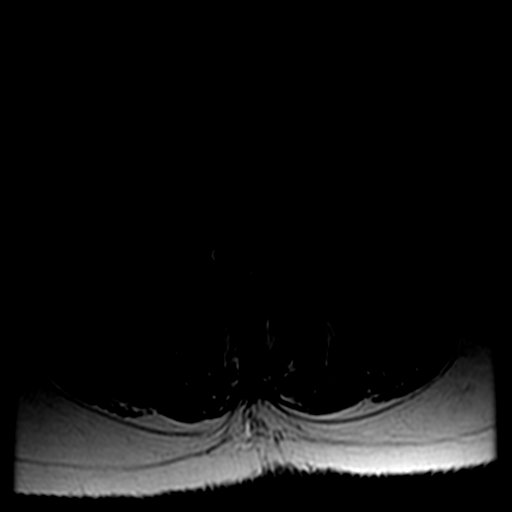
[im 19/22]
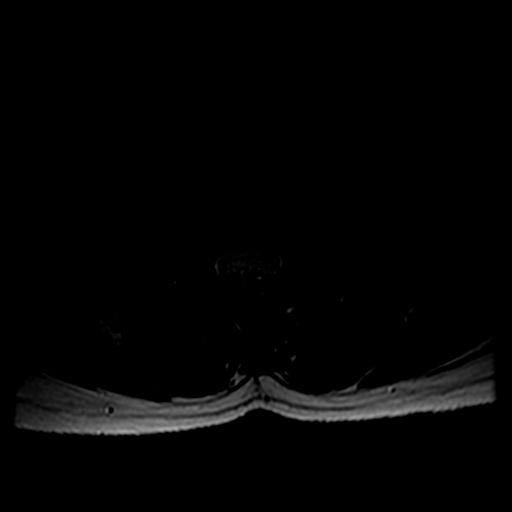

[Series 7: T1 · axial · 4.0mm · 0.35mm/px · z∈[-71,+112]mm · 3 of 22 slices shown (2 of 2)]
[im 3/22]
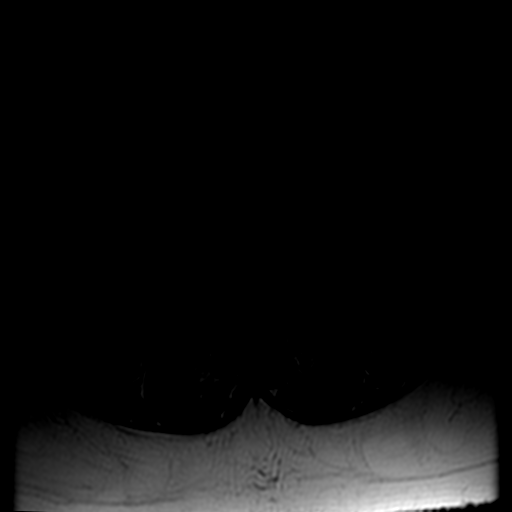
[im 11/22]
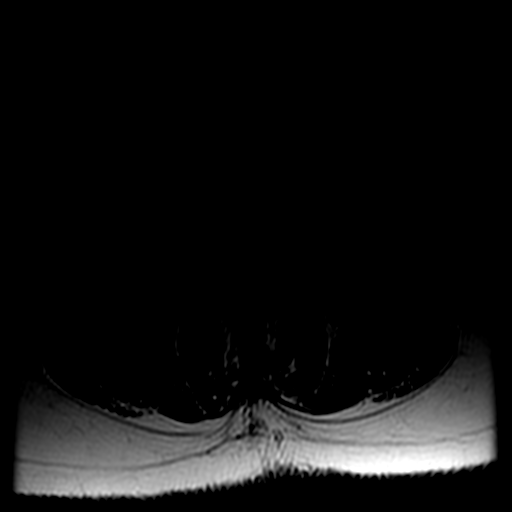
[im 19/22]
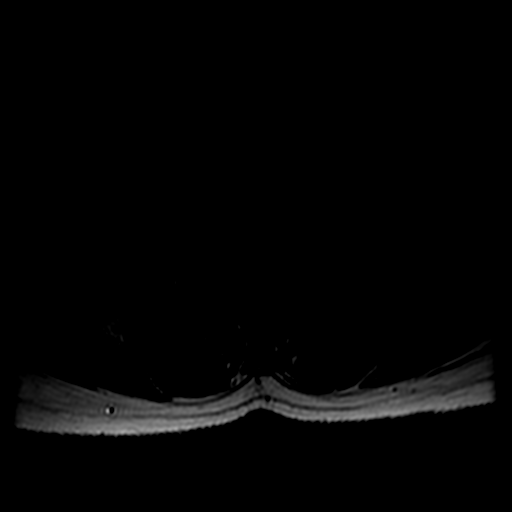

[18 of 48 positions shown; findings below may reference images not displayed]

FINDINGS: Sagittal MR images demonstrate mild retrolisthesis of L2 compared to L3.  Otherwise alignment is normal.  Vertebral bodies demonstrate normal marrow signal.  Mild facet degenerative changes are noted throughout the lumbar spine but no pars defects are seen.
 The last full intervertebral disk space is labeled L5-S1 which correlates with the plain films and the conus medullaris terminates at L1. 
 Incidental note is made on the sagittal images of a small focal central disk protrusion at T11-12.  This is not covered on the axial images.  
 L1-2:  No significant findings.
 L2-3:  There is a broad based shallow disk protrusion with mild impression on the right side of the thecal sac and lateral recess encroachment.  There is also foraminal encroachment but no significant stenosis.  
 L3-4:  No focal disk protrusions, spinal or foraminal stenosis.  Mild bulging annulus with minimal foraminal encroachment.  There is also a small cyst associated with the left L3 nerve root which is probably a perineural cyst.
 L4-5:  Annular rent and a tiny central disk protrusion with minimal impression on the central thecal sac.  No lateral recess or foraminal stenosis.
 L5-S1:  Hypertrophic facet disease but no focal disk protrusions, spinal or foraminal stenosis.
IMPRESSION: 1.  Broad based shallow disk protrusion to the right at L2-3 with mild impression on the right side of the thecal sac and mild foraminal encroachment.
 2.  Diffuse bulging annulus at L3-4 without spinal or lateral recess stenosis.  Mild foraminal encroachment bilaterally.  There is also a small perineural cyst on the left associated with the L3 nerve root.
 3.  Annular rent and a tiny central disk protrusion at L4-5.
 4.  Small central disk protrusion at T11-12.

## 2009-05-16 ENCOUNTER — Emergency Department (HOSPITAL_COMMUNITY): Admission: EM | Admit: 2009-05-16 | Discharge: 2009-05-16 | Payer: Self-pay | Admitting: Emergency Medicine

## 2009-09-04 IMAGING — CR DG CHEST 2V
2 series · 2 of 2 positions shown · non-contrast
Comparison: 10/27/06.

CLINICAL DATA: Chest pain.
 CHEST - 2 VIEW:

[w chest pa]
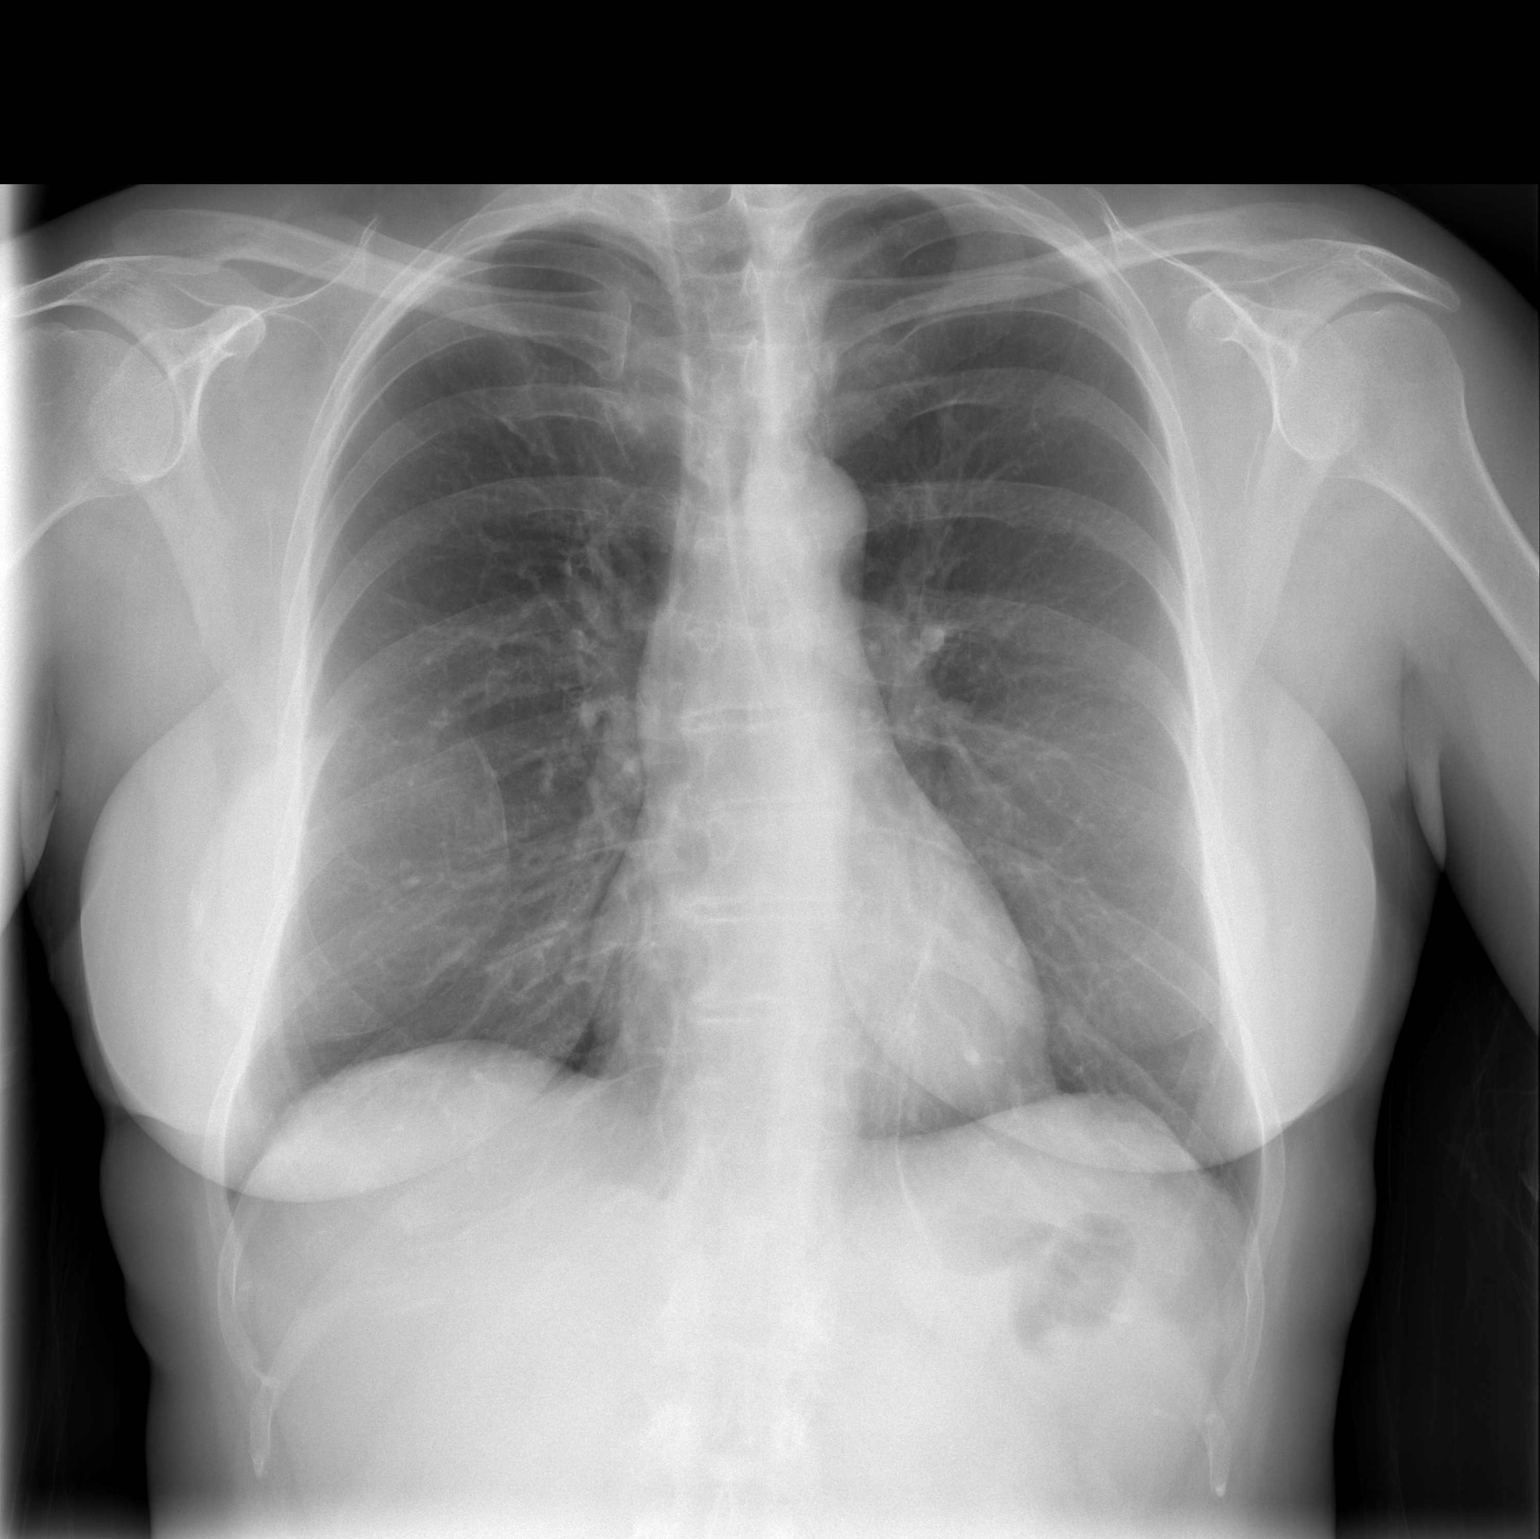

[w chest lat]
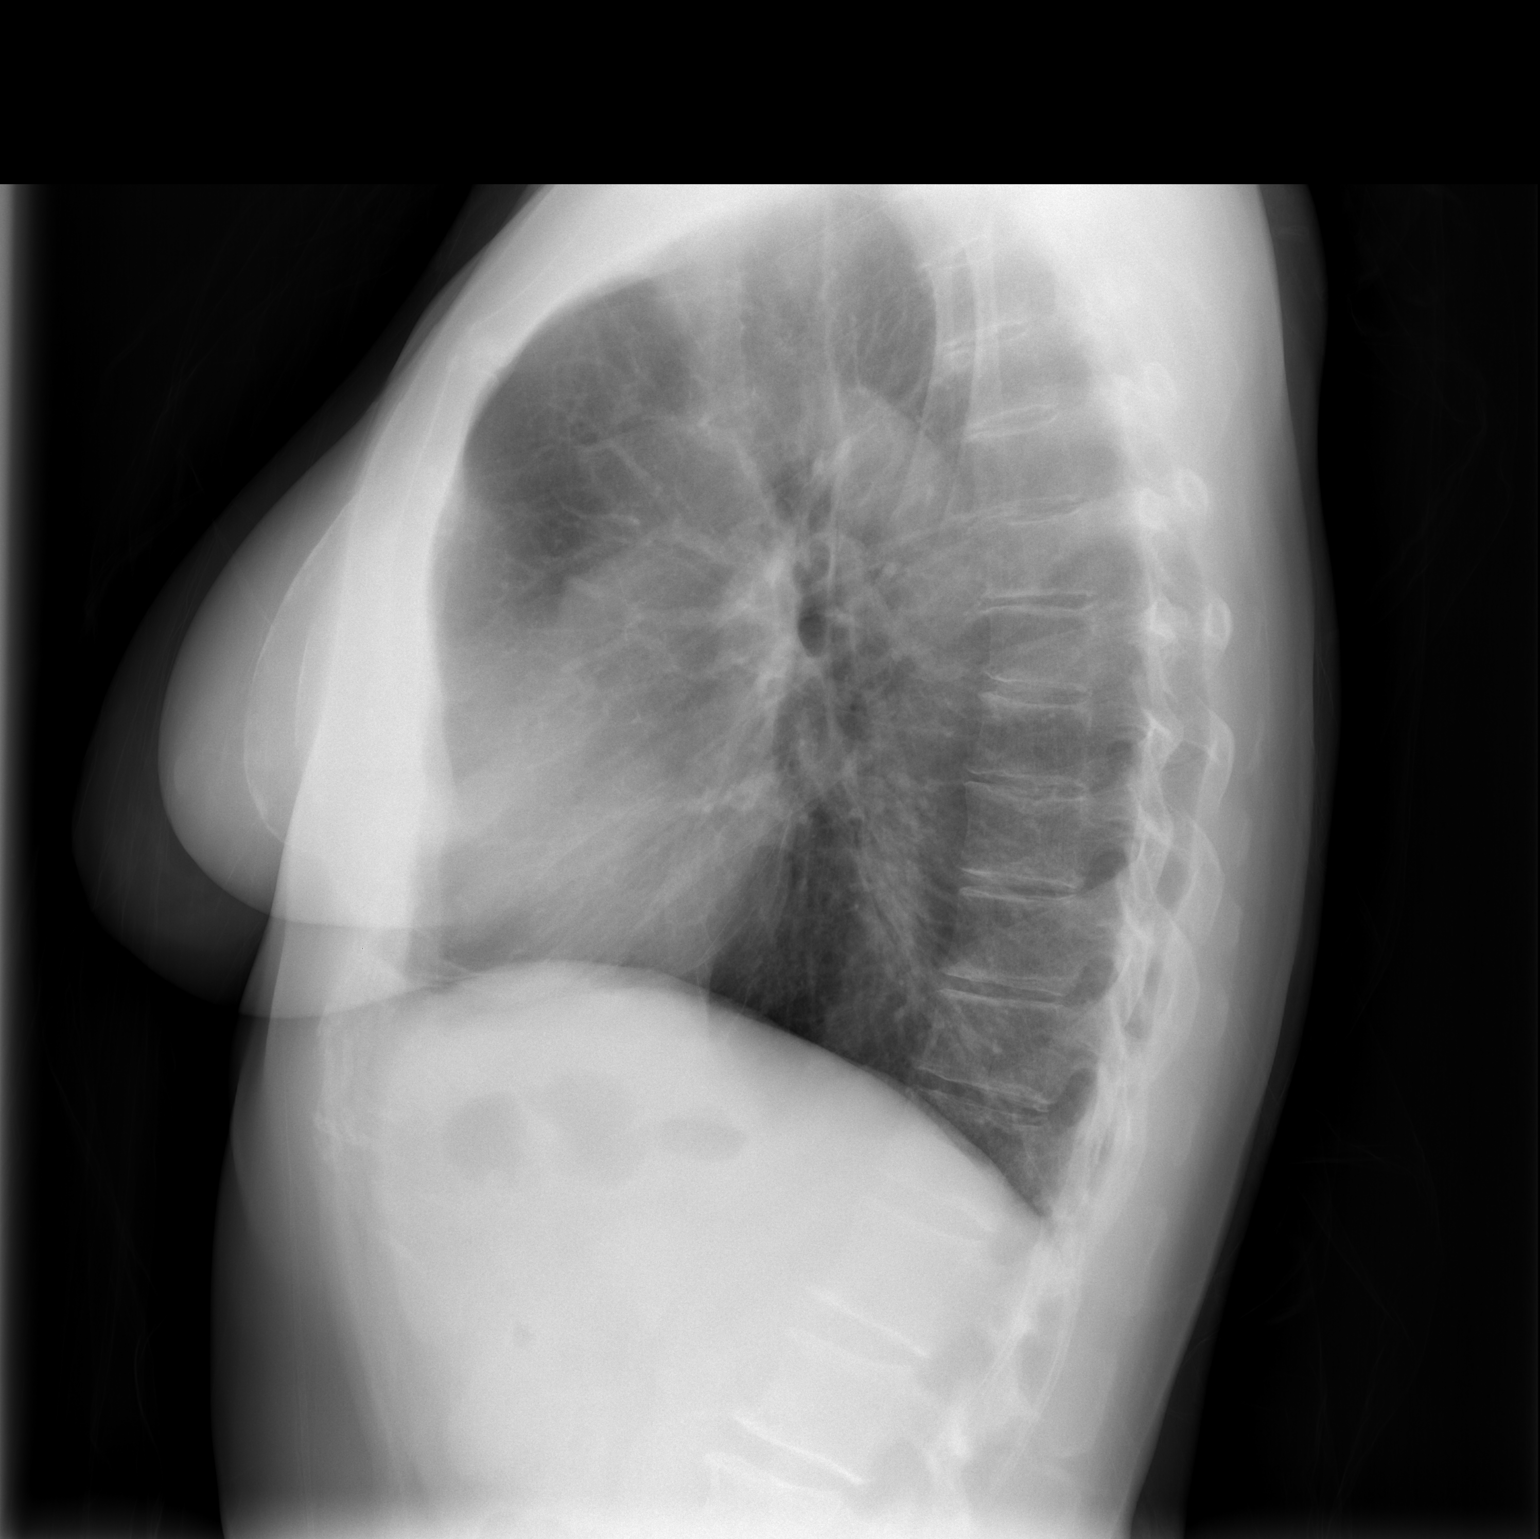

[2 of 2 positions shown; findings below may reference images not displayed]

FINDINGS: Lungs remain mildly hyperaerated.  The heart is normal in size. No focal consolidation or mass. No pneumothoraces or effusions are seen.
IMPRESSION: No active cardiopulmonary disease.  Hyperaeration.

## 2009-09-08 ENCOUNTER — Emergency Department (HOSPITAL_COMMUNITY): Admission: EM | Admit: 2009-09-08 | Discharge: 2009-09-08 | Payer: Self-pay | Admitting: Emergency Medicine

## 2010-03-17 ENCOUNTER — Emergency Department (HOSPITAL_COMMUNITY): Admission: EM | Admit: 2010-03-17 | Discharge: 2010-03-17 | Payer: Self-pay | Admitting: Emergency Medicine

## 2010-03-22 IMAGING — CR DG ANKLE COMPLETE 3+V*R*
3 series · 3 of 3 positions shown · non-contrast
Comparison: None available.

CLINICAL DATA: Pain.

RIGHT ANKLE - COMPLETE 3+ VIEW

[t ankle joint ap right]
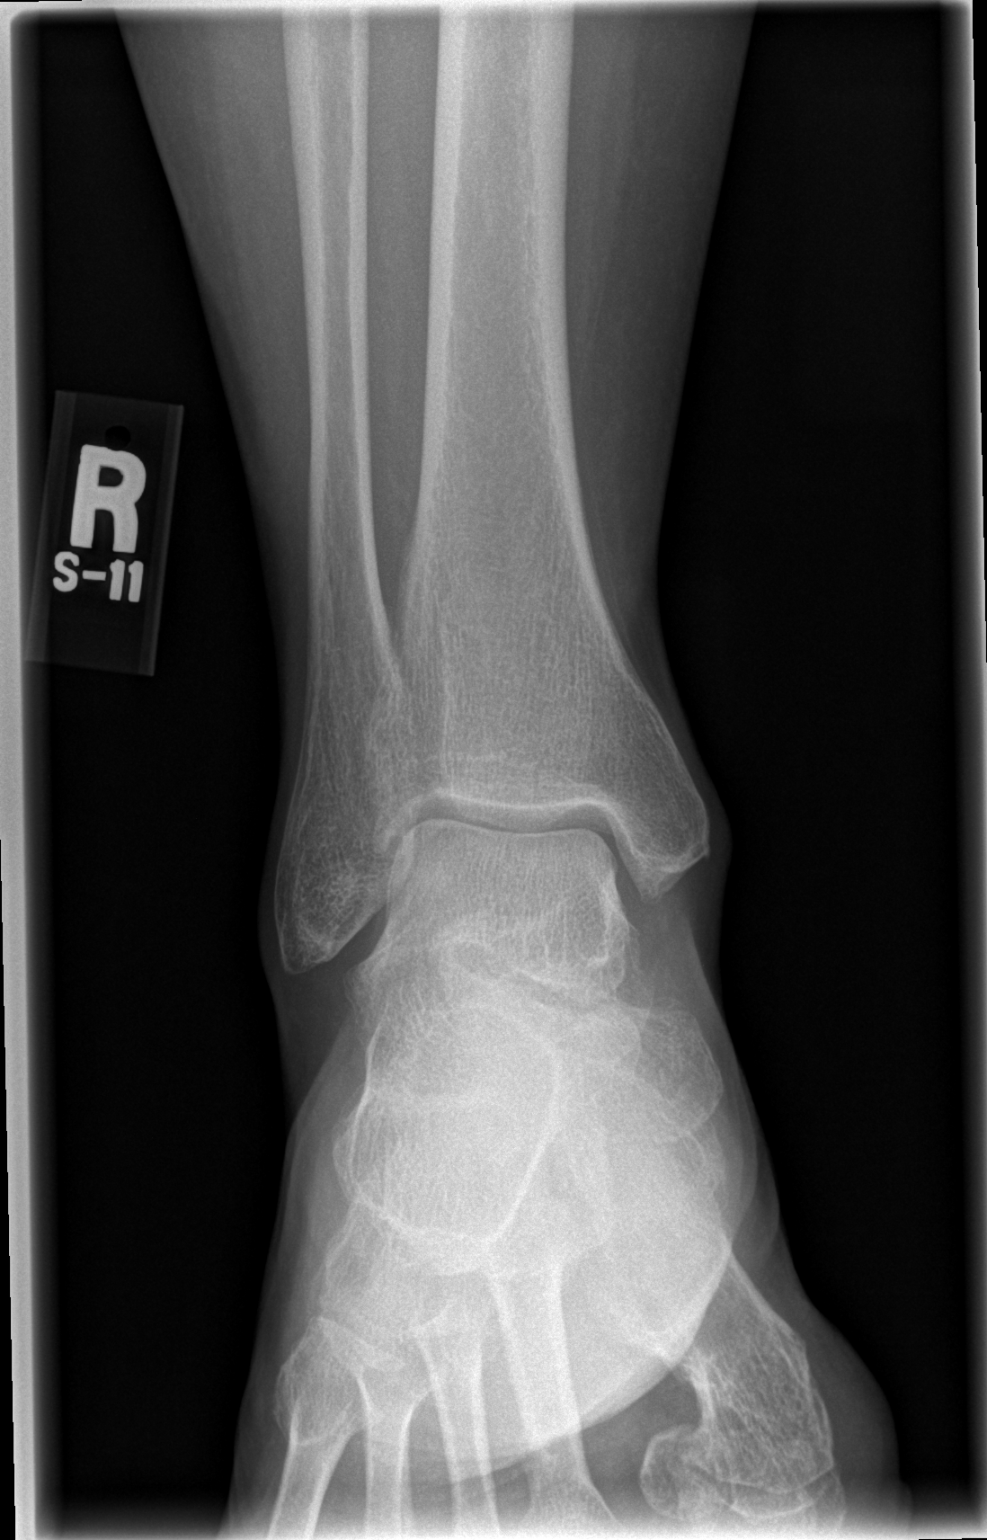

[t ankle joint oblique right]
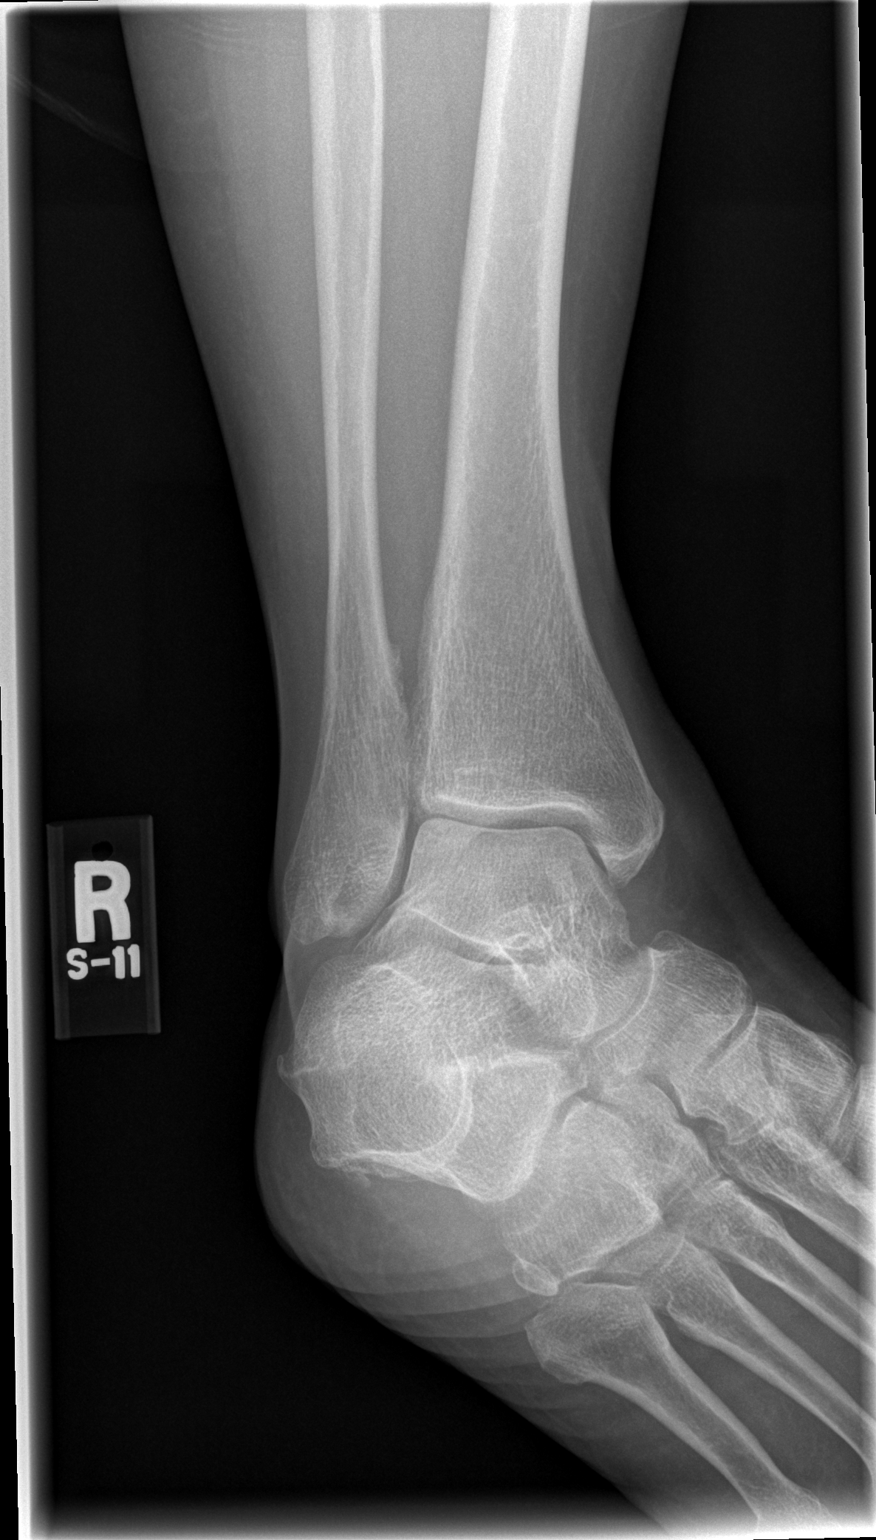

[t ankle joint lat right]
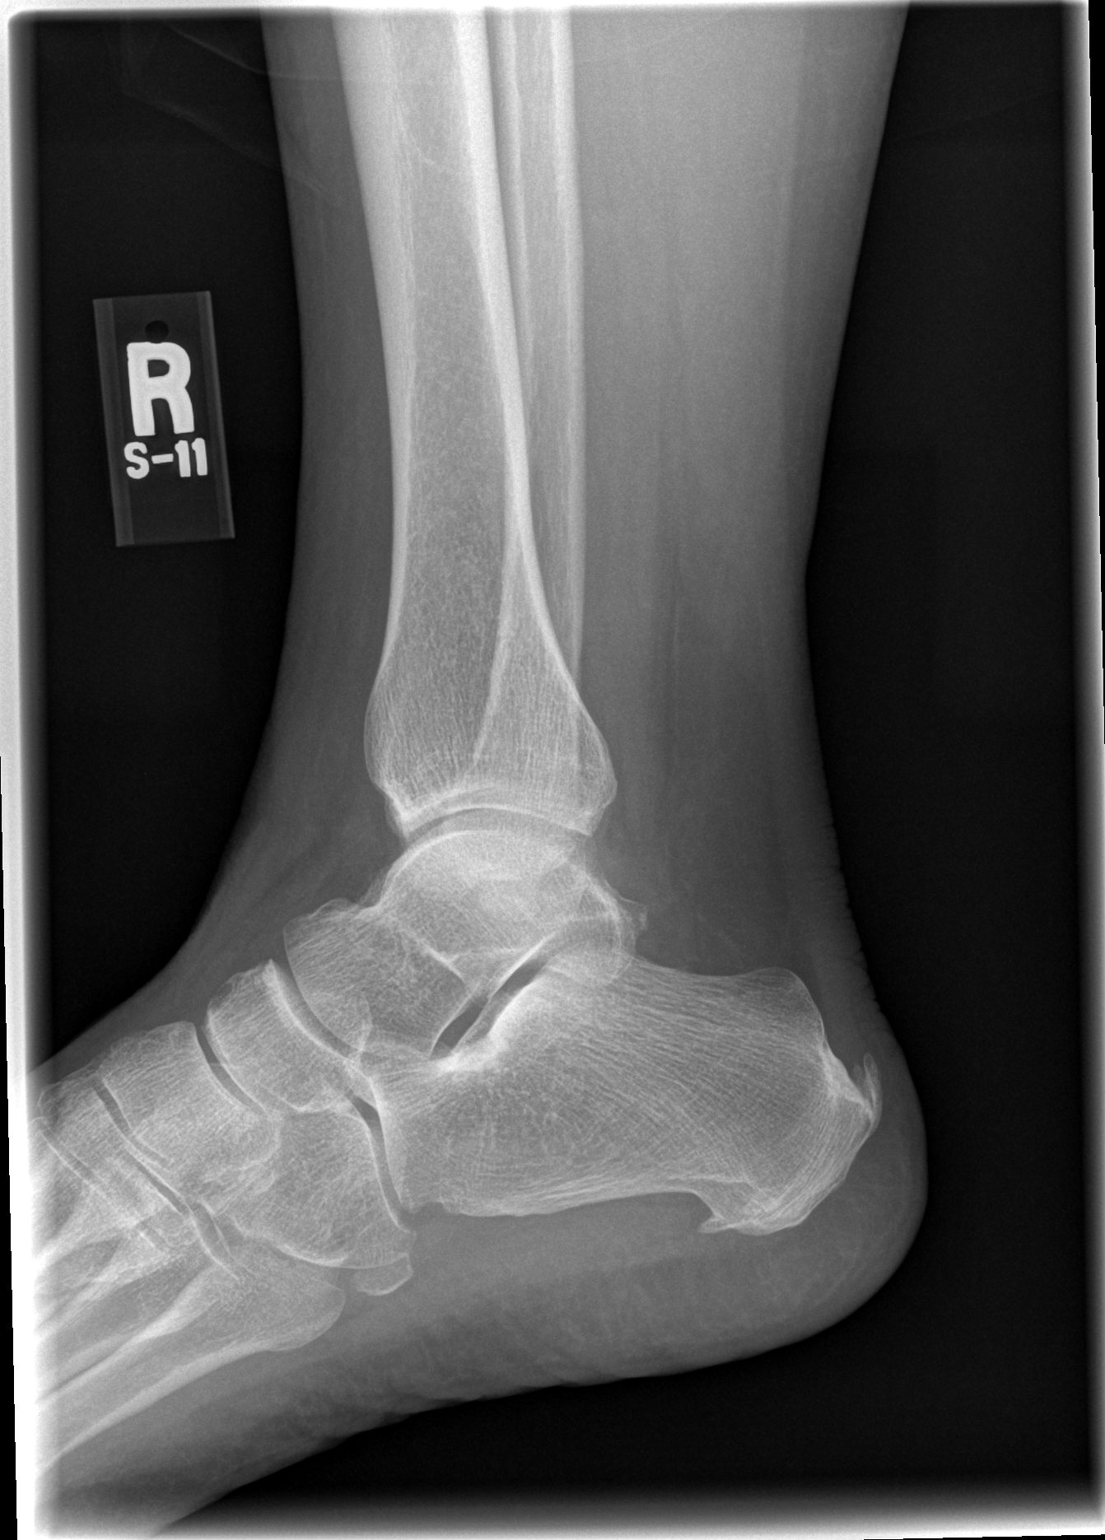

[3 of 3 positions shown; findings below may reference images not displayed]

FINDINGS: Dorsal and plantar continual spurs are noted.  Imaged
bones, joints and soft tissues otherwise appear normal.
IMPRESSION: No acute finding.

## 2010-03-27 IMAGING — CT CT PELVIS W/ CM
1 of 3 series · 14 of 32 positions shown, 19 images · IV contrast (agent unspecified)
Comparison: CT 02/23/2007

CT ABDOMEN

CLINICAL DATA: Abdominal pain and nausea

CT ABDOMEN AND PELVIS WITH CONTRAST
TECHNIQUE: Multidetector CT imaging of the abdomen and pelvis was
performed using the standard protocol following bolus
administration of intravenous contrast.
Contrast: 125 ml of 9mnipaque-ZTT IV

[Series 2: abd_pel 5.0 b40f st · axial · 0.73mm/px · z∈[-502,-78]mm · 14 of 97 slices shown, 19 images]
[im 6/97  soft-tissue]
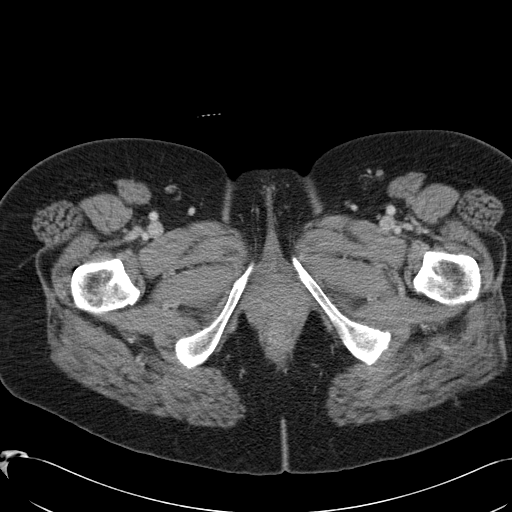
[im 6/97  bone]
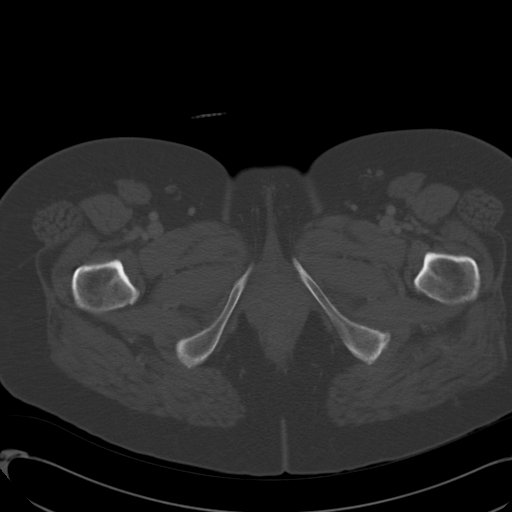
[im 16/97  soft-tissue]
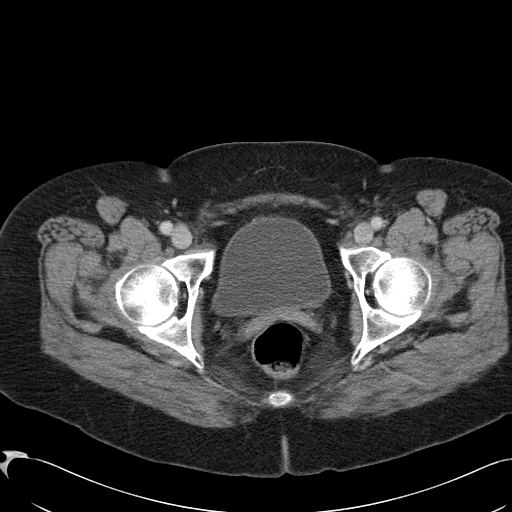
[im 21/97  soft-tissue]
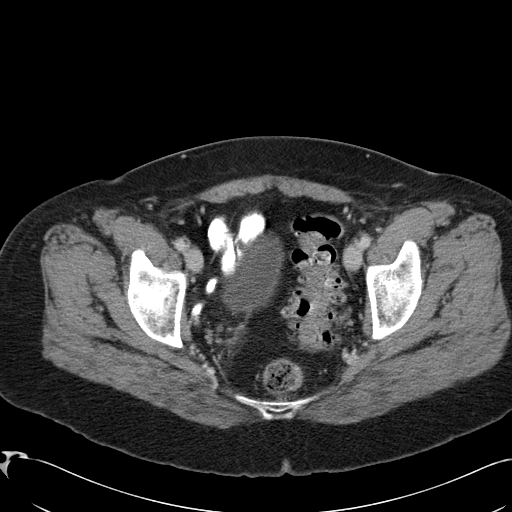
[im 26/97  soft-tissue]
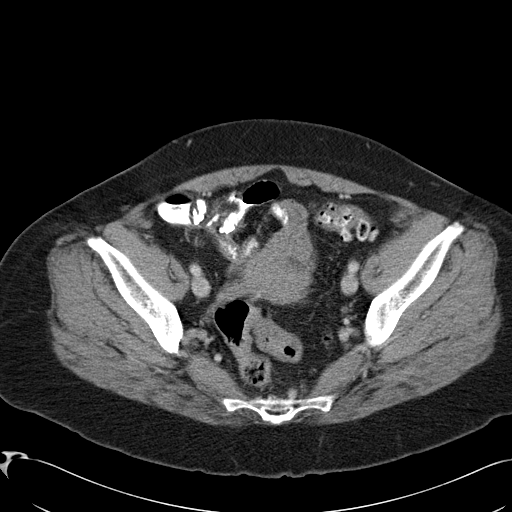
[im 36/97  soft-tissue]
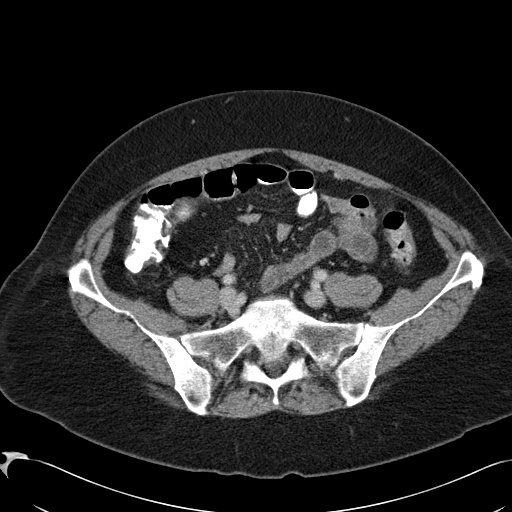
[im 41/97  soft-tissue]
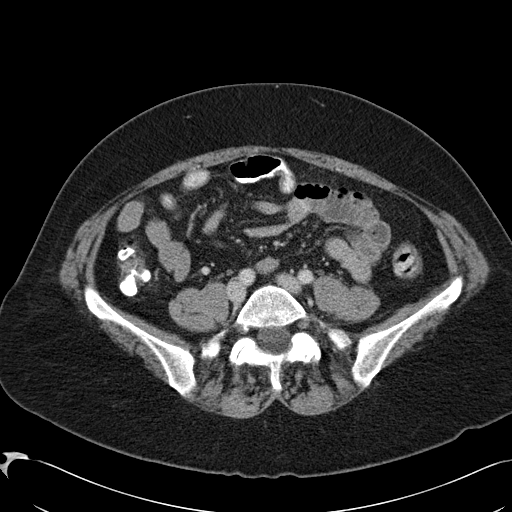
[im 51/97  soft-tissue]
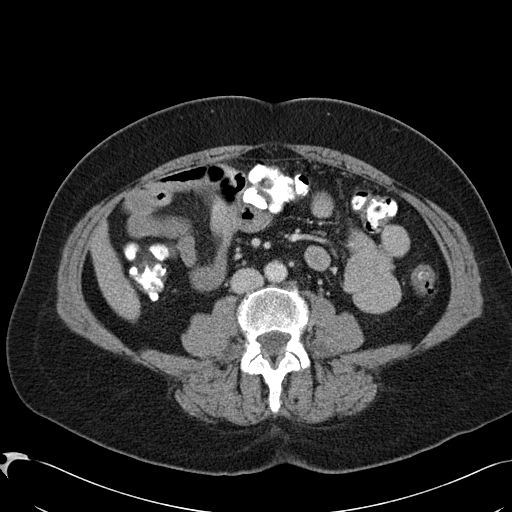
[im 56/97  soft-tissue]
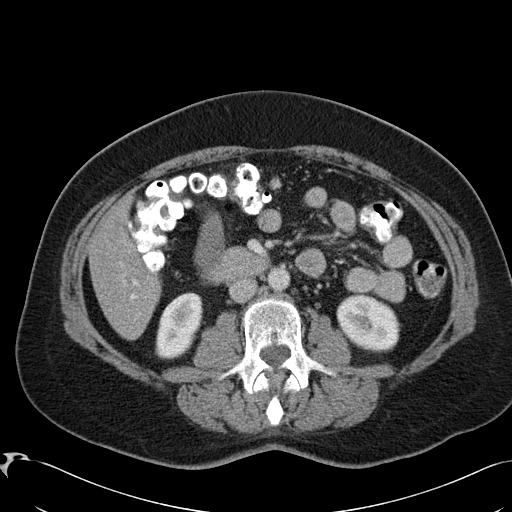
[im 61/97  soft-tissue]
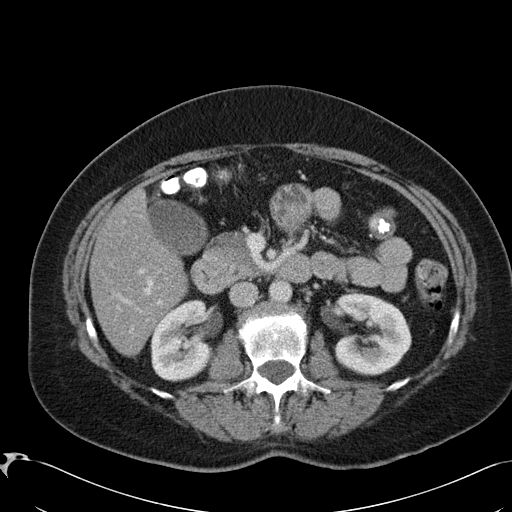
[im 61/97  bone]
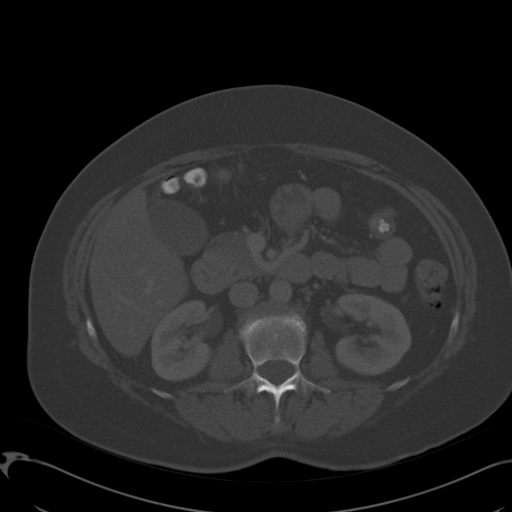
[im 71/97  soft-tissue]
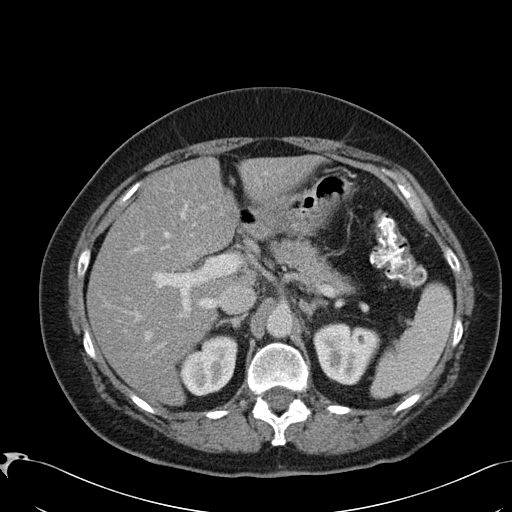
[im 76/97  soft-tissue]
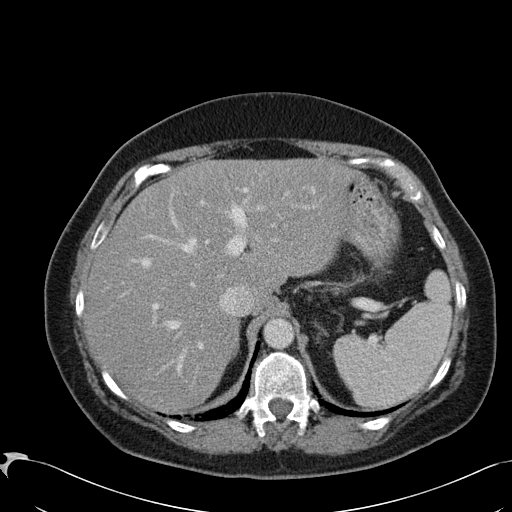
[im 76/97  lung]
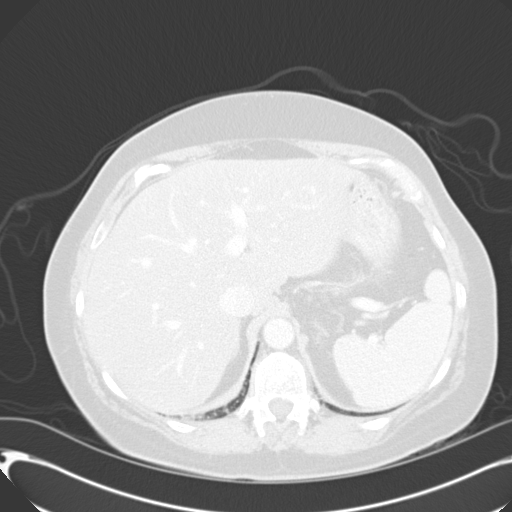
[im 81/97  soft-tissue]
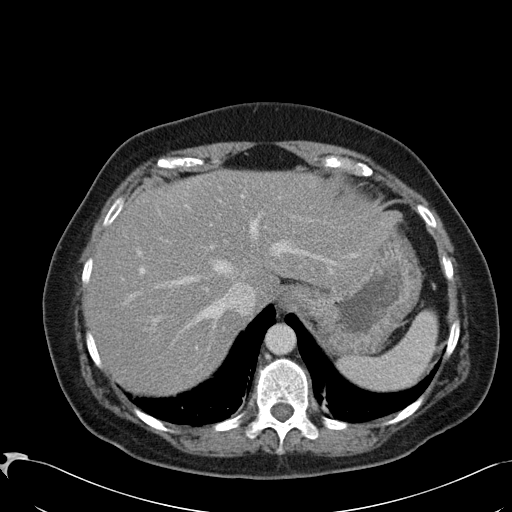
[im 81/97  lung]
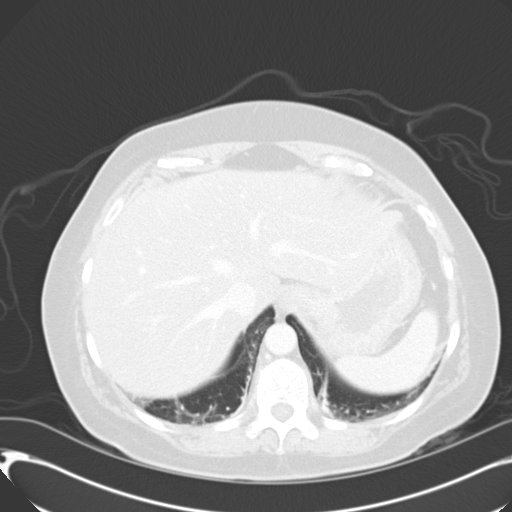
[im 86/97  lung]
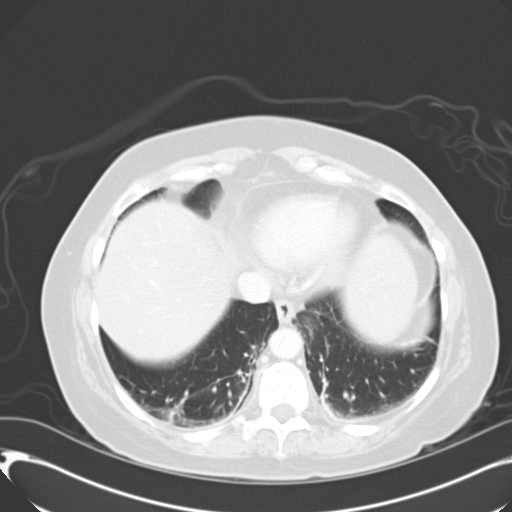
[im 91/97  soft-tissue]
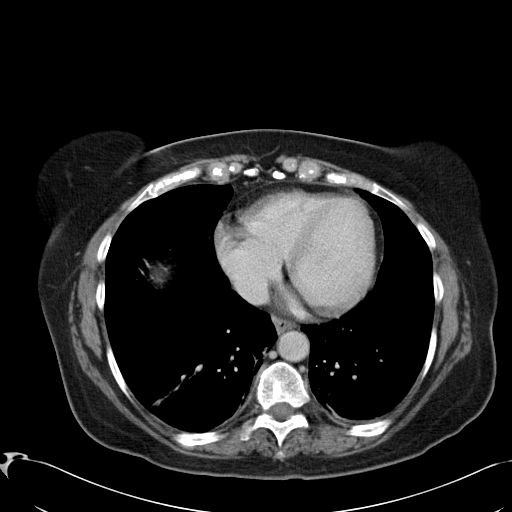
[im 91/97  lung]
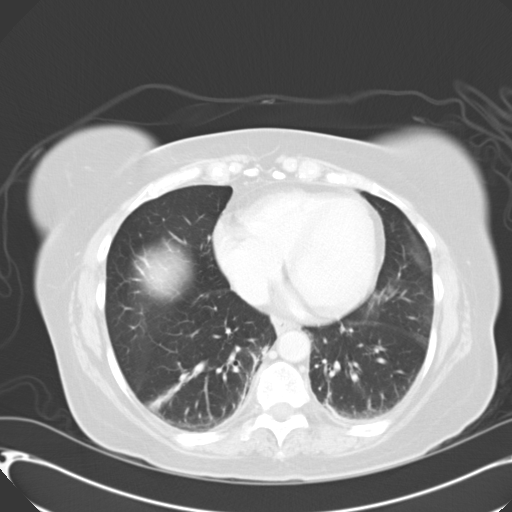

[14 of 32 positions shown; findings below may reference images not displayed]

FINDINGS: Bilateral breast prostheses.  Diffuse fatty infiltration
of the liver.  No biliary duct dilatation. Focal low attenuation of
particularly the inferior aspect the pancreatic head is again
noted.  This may be due to fatty infiltration as opposed to acute
pancreatitis.  Difficult to exclude acute pancreatitis with
certainty however I would favor this that this represents an acute
chronic finding.  Kidneys and adrenal glands unremarkable.  No
unusual bowel wall thickening.  Scattered colonic diverticula
IMPRESSION: Hypoattenuation of the pancreatic head, possibly focal fatty
infiltration of the pancreas.  Acute pancreatitis or pancreatic
tumor cannot be exclude with certainty.  Recommend clinical
correlation. Consider MRI of the pancreas for further assessment,
particularly if the findings do not correlate with acute
pancreatitis.

CT PELVIS
FINDINGS: No acute pelvic findings.  Extensive sigmoid
diverticulosis.  Negative for diverticulitis.  Bladder
unremarkable.  Hysterectomy.  1.3 x 1.7 cm cyst in the peroneum in
the region of the vagina probably represents a benign cystic lesion
such as a Bartholin cyst.
IMPRESSION: Sigmoid diverticular disease.  Cyst related to the region of the
vagina such as a Bartholin cyst. This was also appreciated on the
02/23/2007 CT

## 2010-03-27 IMAGING — CR DG ABDOMEN 2V
3 series · 3 of 3 positions shown · non-contrast
Comparison: None

CLINICAL DATA: Abdominal pain

ABDOMEN - 2 VIEW

[w abdomen upright]
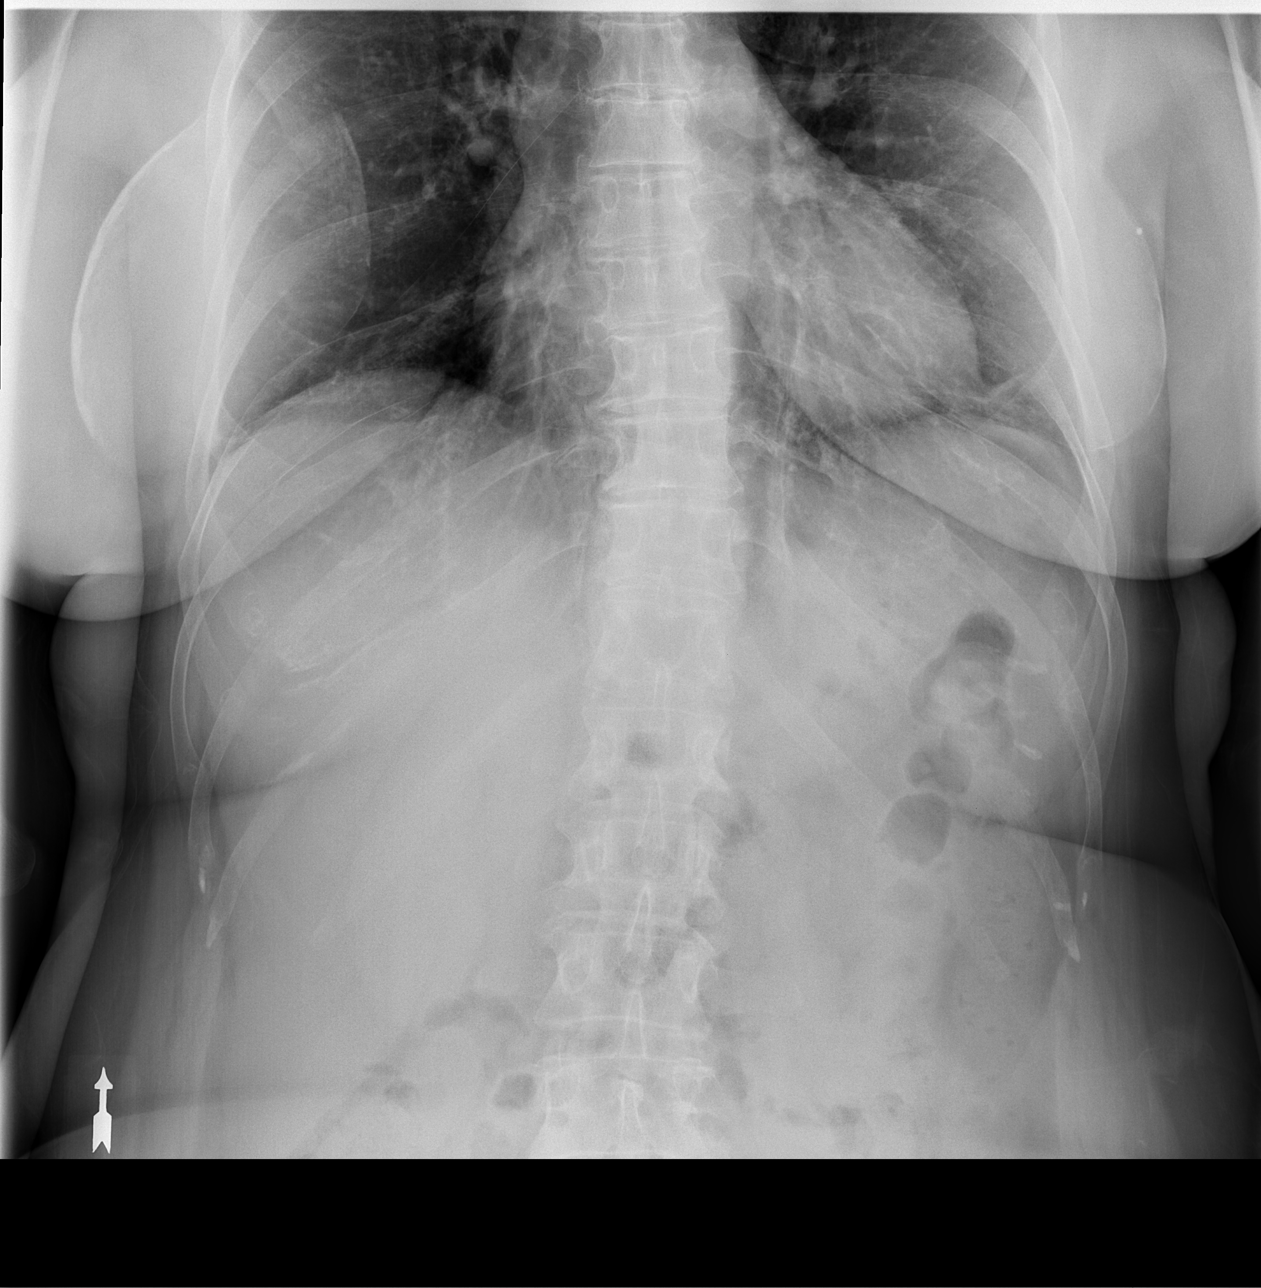

[t abdomen supine (1 of 2)]
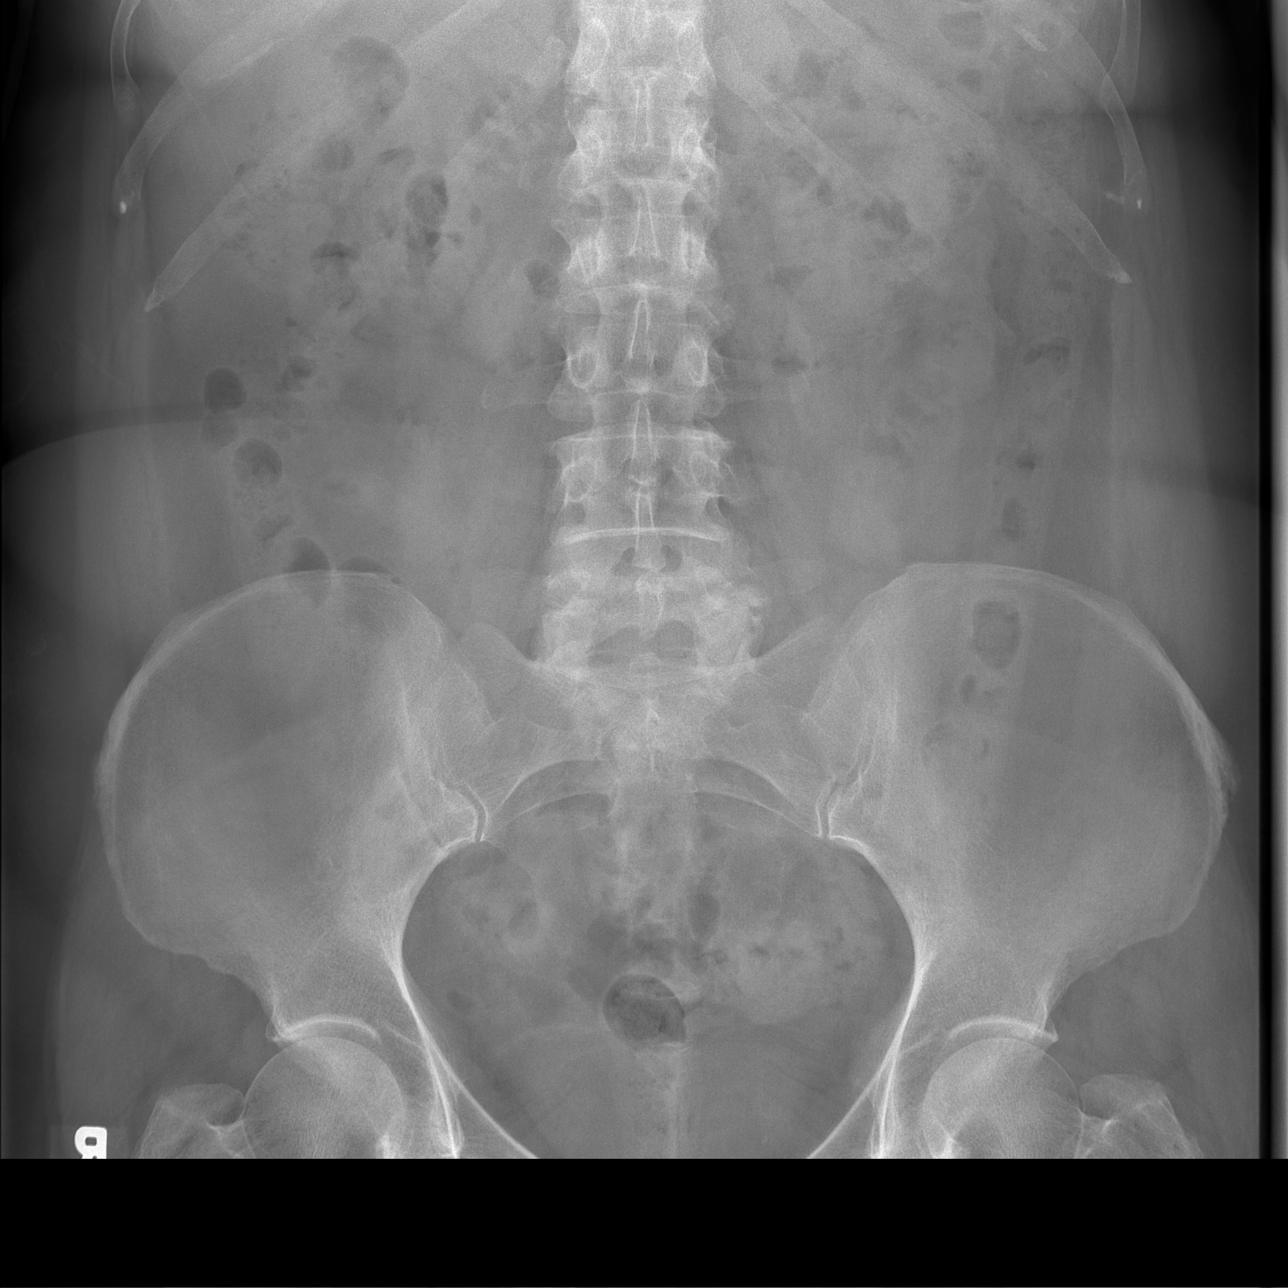

[t abdomen supine (2 of 2)]
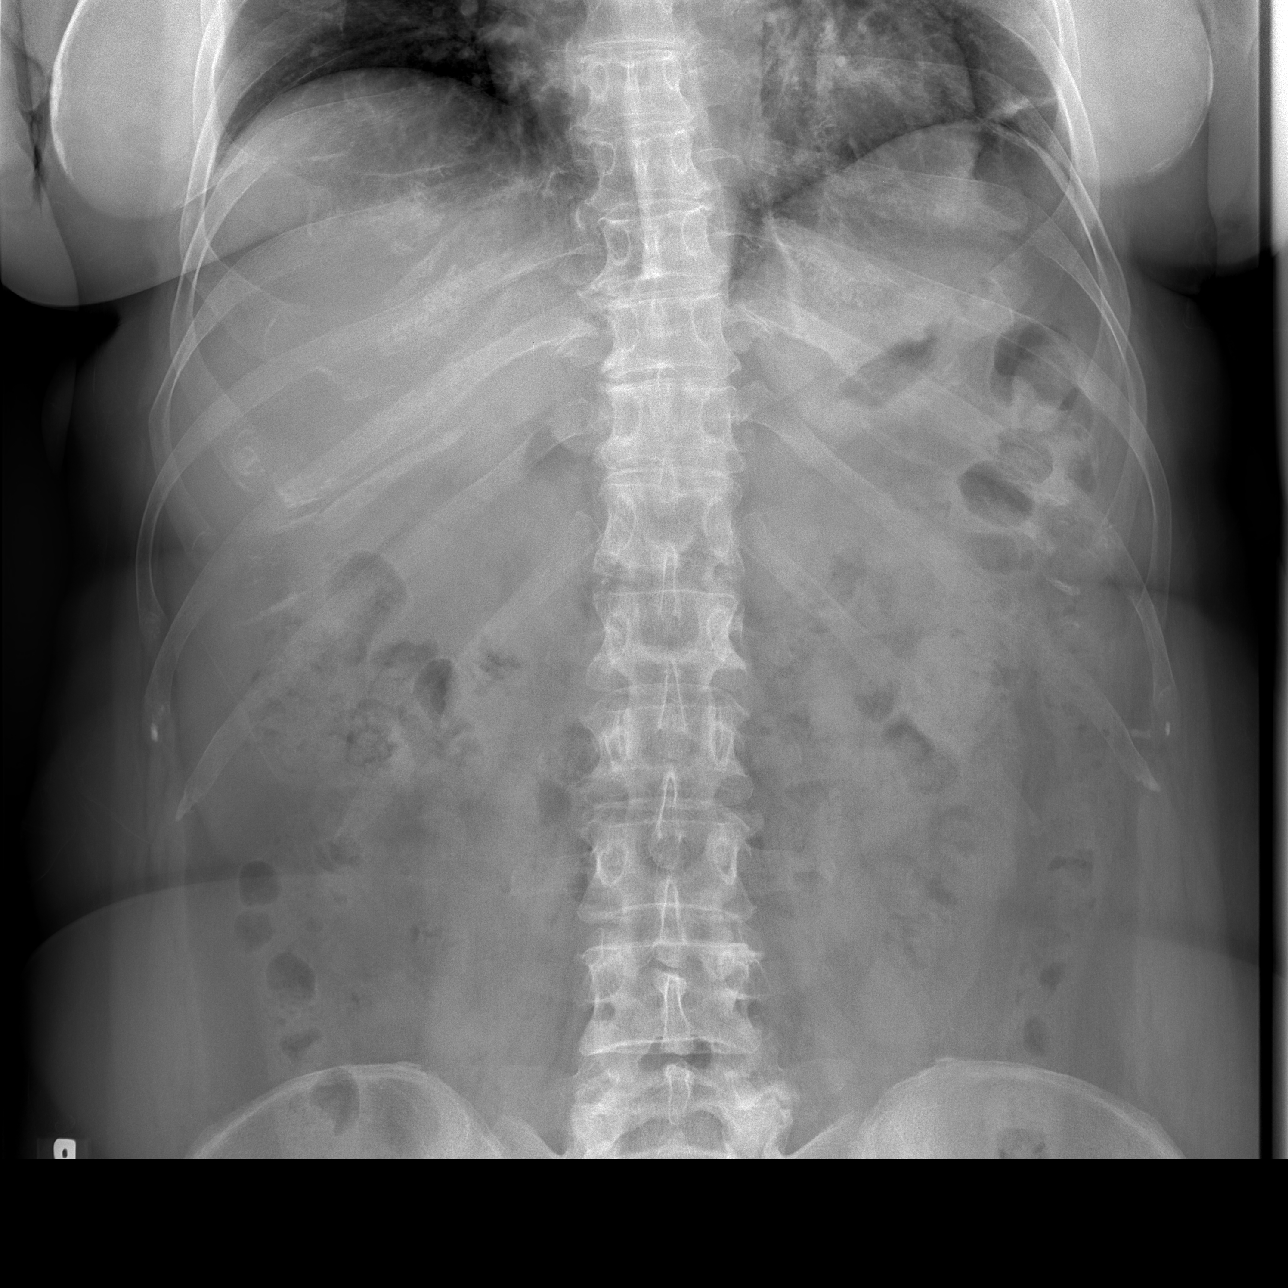

[3 of 3 positions shown; findings below may reference images not displayed]

FINDINGS: The lung bases are grossly clear.  No free air is seen.
There is scattered air and stool in the colon.  No dilated loops of
small bowel to suggest obstruction.  The soft tissue shadows of the
abdomen are maintained.  No worrisome calcifications are seen.  The
bony structures are grossly normal.
IMPRESSION: 1.  No plain film evidence of acute abdominal process.

## 2010-03-28 IMAGING — MR MR ABDOMEN WO/W CM
8 of 12 series · 22 of 48 positions shown · IV contrast (30ML  MULTIHANCE)
Comparison: CT of 02/27/2008 and 02/23/2007.

03/07/08 – REPORT NOW REFLECTS CORRECT ORDERING PHYSICIAN.
CLINICAL DATA: Possible pancreatic mass.

 MRI ABDOMEN WITH AND WITHOUT CONTRAST
TECHNIQUE: Multiplanar multisequence MR imaging of the abdomen was
 performed both before and after administration of intravenous
 contrast.
 Contrast: 16 ml Multihance

[Series 1: 3pl loc · axial · 7.0mm · 1.56mm/px · 1 of 33 slices shown]
[im 1/33]
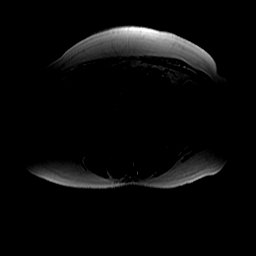

[Series 2: ax ssfse · axial · 5.0mm · 1.48mm/px · z∈[-86,+154]mm · 2 of 49 slices shown]
[im 1/49]
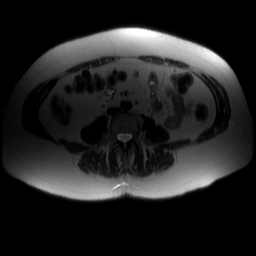
[im 49/49]
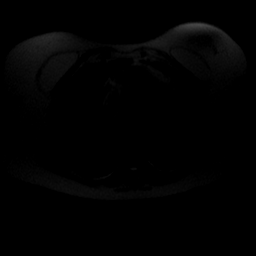

[Series 3: cor ssfse · coronal · 5.0mm · 1.56mm/px · 2 of 39 slices shown]
[im 1/39]
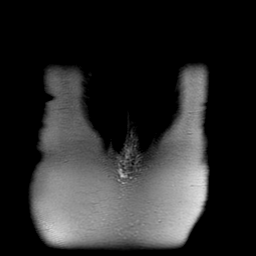
[im 39/39]
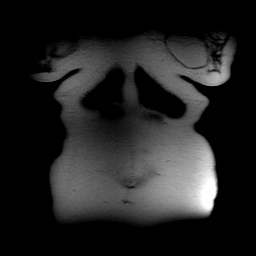

[Series 5: T2 fat-sat · axial · 5.0mm · 0.74mm/px · z∈[-78,+137]mm · 3 of 44 slices shown]
[im 1/44]
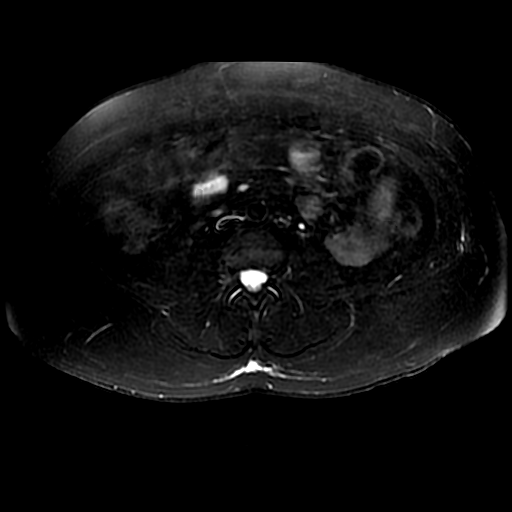
[im 22/44]
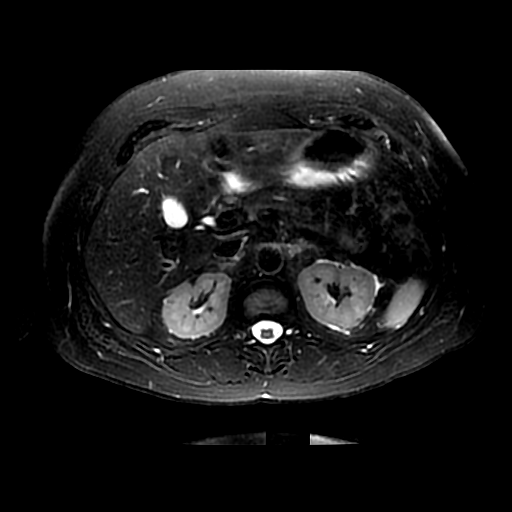
[im 44/44]
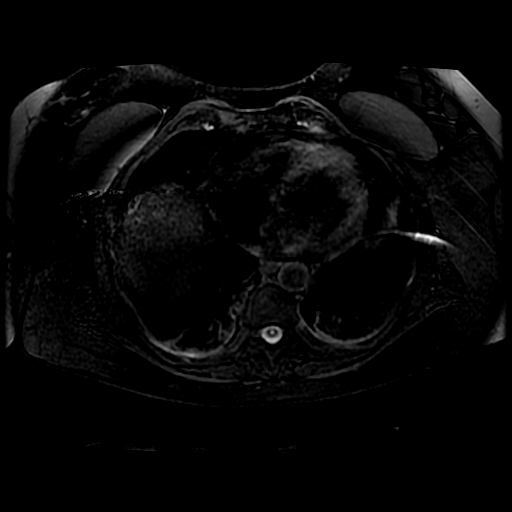

[Series 6: T1 fat-sat · axial · 5.0mm · 0.76mm/px · z∈[-84,+90]mm · 2 of 30 slices shown]
[im 1/30]
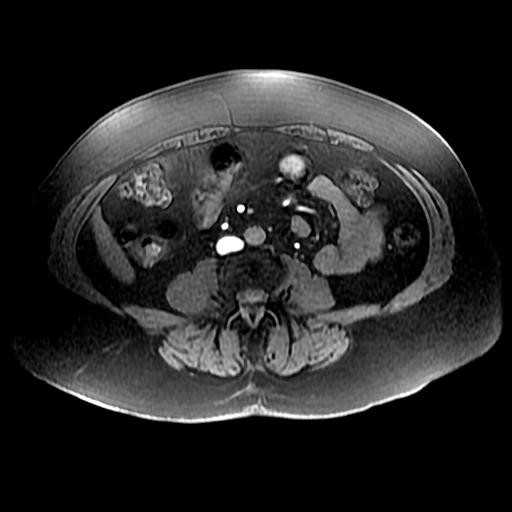
[im 30/30]
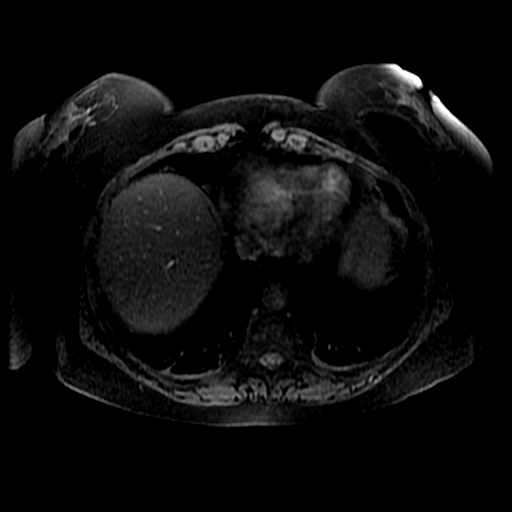

[Series 7: ax spgr dual · axial · 6.0mm · 0.80mm/px · z∈[-84,+91]mm · 3 of 52 slices shown]
[im 1/52]
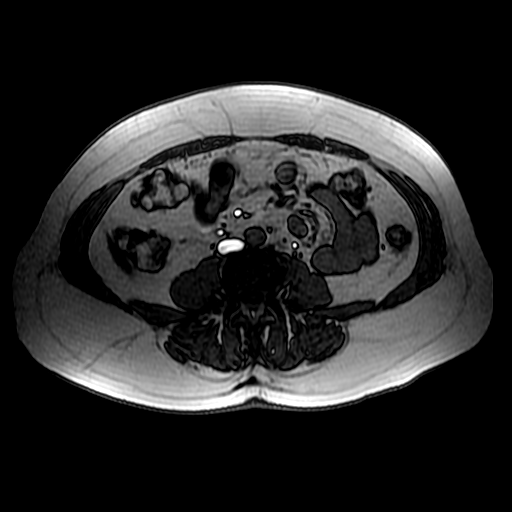
[im 26/52]
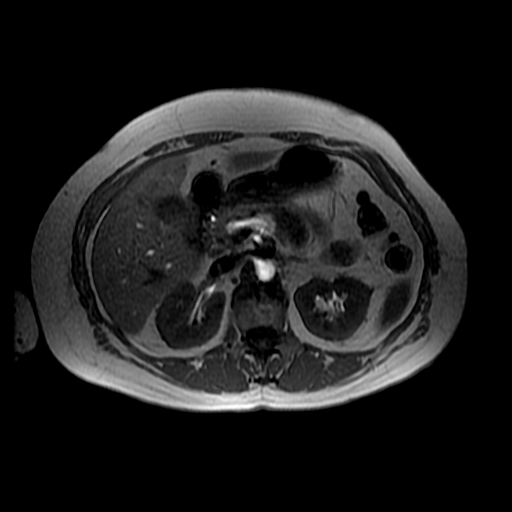
[im 52/52]
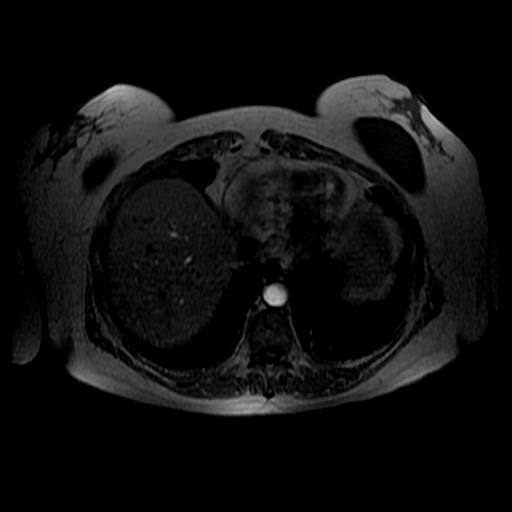

[Series 8: T1 dynamic · axial · non-contrast · 4.8mm · 0.78mm/px · z∈[-90,+148]mm · 6 of 100 slices shown (1 of 2)]
[im 1/100]
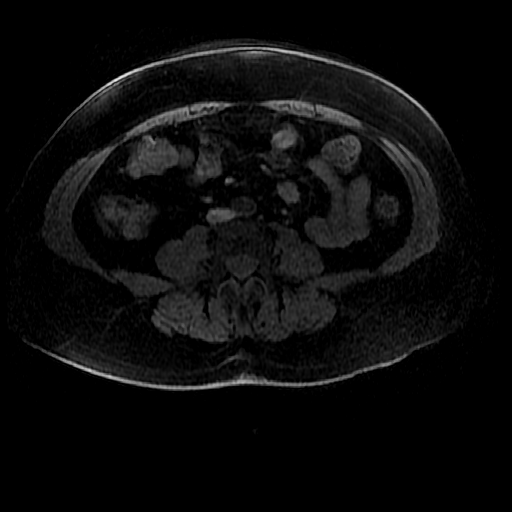
[im 20/100]
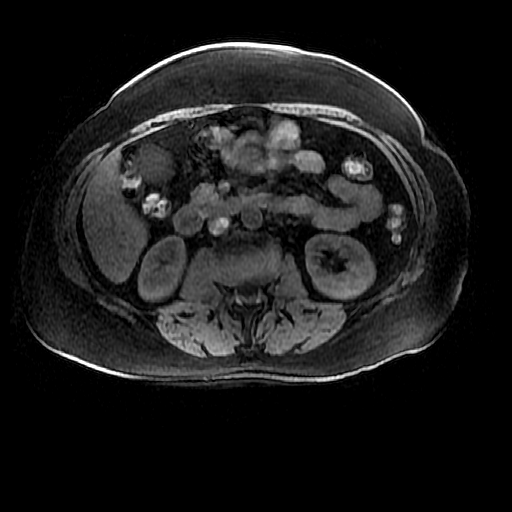
[im 40/100]
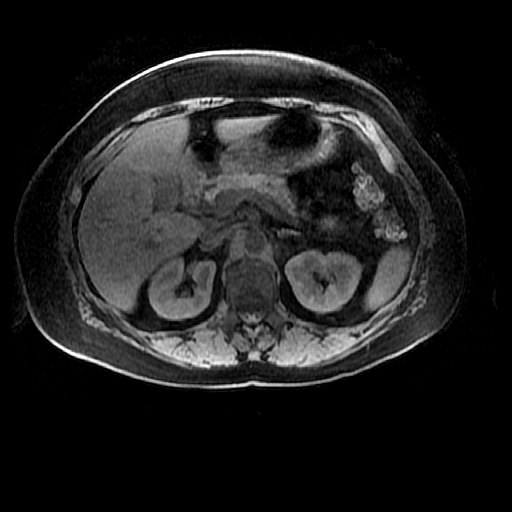
[im 60/100]
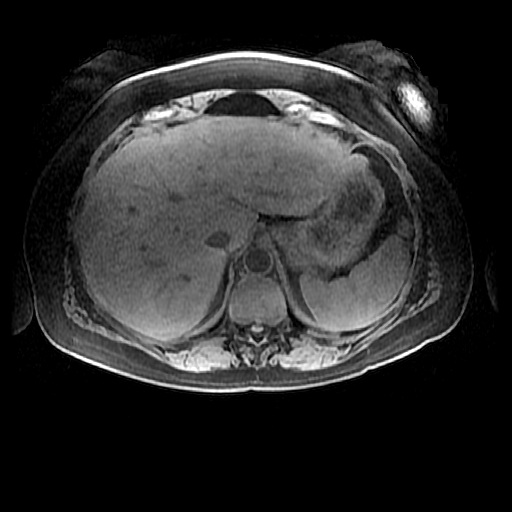
[im 80/100]
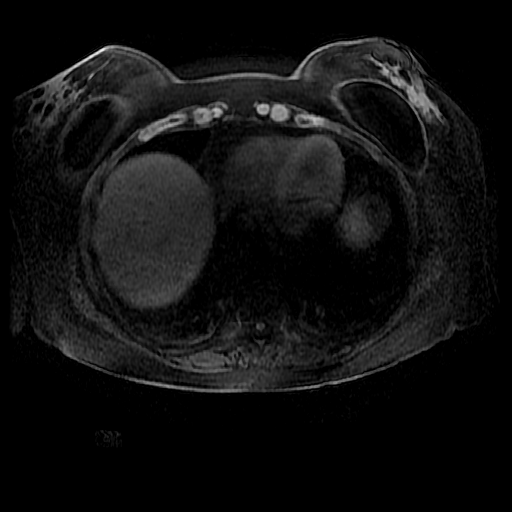
[im 100/100]
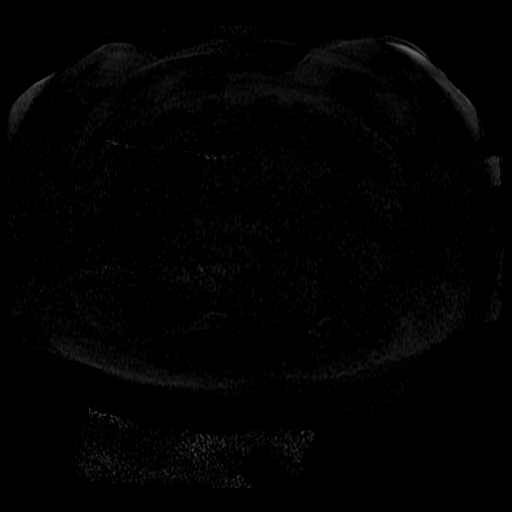

[Series 9: T1 dynamic · axial · 4.8mm · 0.78mm/px · z∈[-90,+4]mm · 3 of 100 slices shown (2 of 2)]
[im 1/100]
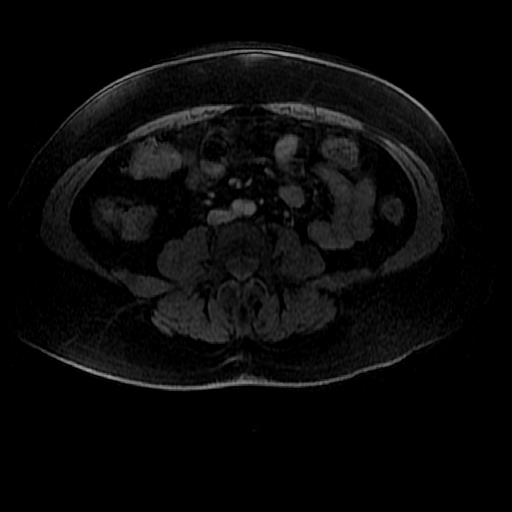
[im 20/100]
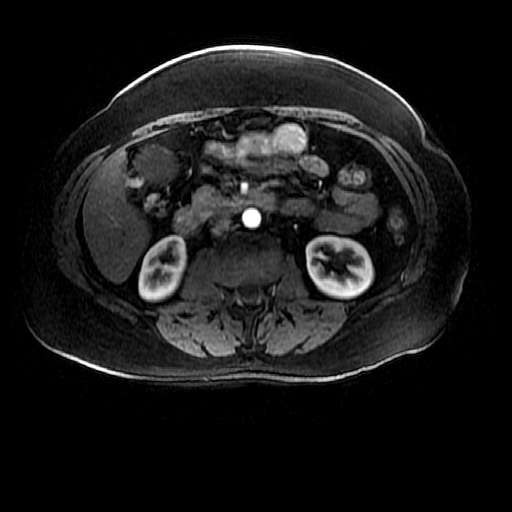
[im 40/100]
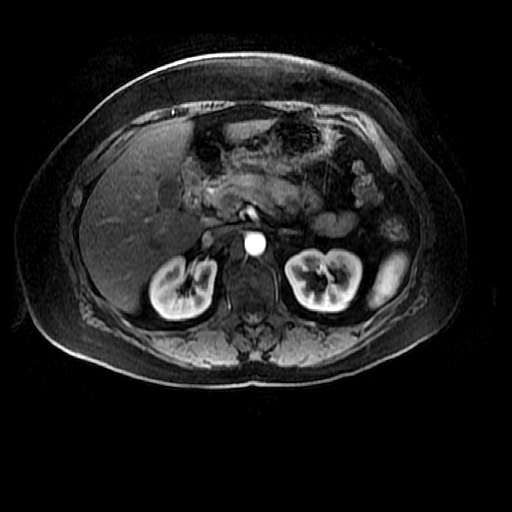

[22 of 48 positions shown; findings below may reference images not displayed]

FINDINGS: Mild right lower lobe atelectasis. Bilateral breast
 implants. Normal heart size without pericardial or pleural
 effusion.

 Signal dropout on out of phase images consistent with fatty
 infiltration diffusely of the liver. Hepatomegaly at 22.2 cm.

 Normal spleen, stomach, adrenal glands, gallbladder, biliary
 system. Tiny bilateral renal cysts.

 No pancreatic ductal dilatation or evidence of acute pancreatitis.
 The CT abnormality corresponds to fat deposition in the pancreatic
 head uncinate process. This is most apparent on image 19 of series
 7. The pancreas maintains its T1 hyperintense appearance on the in
 phase series, thus excluding pancreatic adenocarcinoma. Image 16
 of series 7.

 No abdominal adenopathy or ascites.
IMPRESSION: 1. The CT abnormality corresponds to focal fat deposition in the
 pancreas. No evidence of pancreatic head mass or acute
 pancreatitis.
 2. Hepatomegaly and fatty infiltration of the liver.

## 2010-04-21 IMAGING — CT CT ABDOMEN W/ CM
2 of 5 series · 17 of 46 positions shown, 19 images · IV contrast (water/omni  & 100 ML OMNI 300)
Comparison: [DATE] [DATE] and 02/27/2008

CT ABDOMEN

CLINICAL DATA: Left lower quadrant abdominal pain

CT ABDOMEN AND PELVIS WITH CONTRAST
TECHNIQUE: Multidetector CT imaging of the abdomen and pelvis was
performed using the standard protocol following bolus
administration of intravenous contrast.
Contrast:  100 ml Smnipaque-THH

[Series 2: routine abdomen · axial · 0.84mm/px · z∈[-448,-48]mm · 14 of 90 slices shown, 16 images]
[im 5/90  soft-tissue]
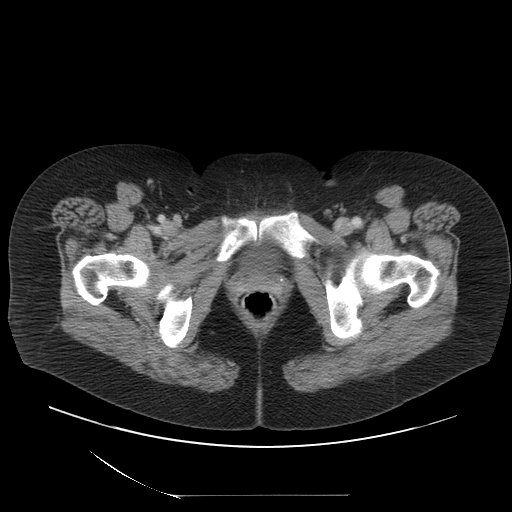
[im 5/90  bone]
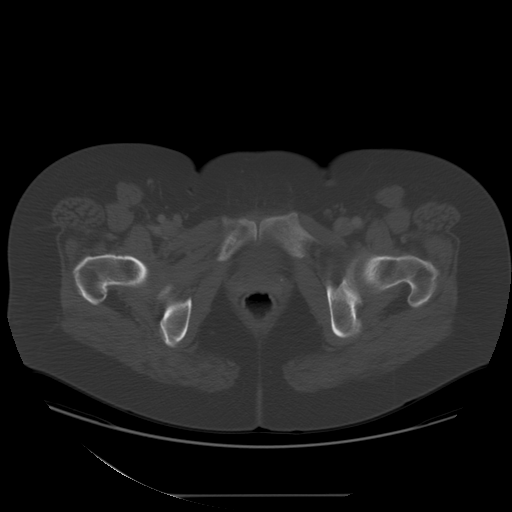
[im 14/90  soft-tissue]
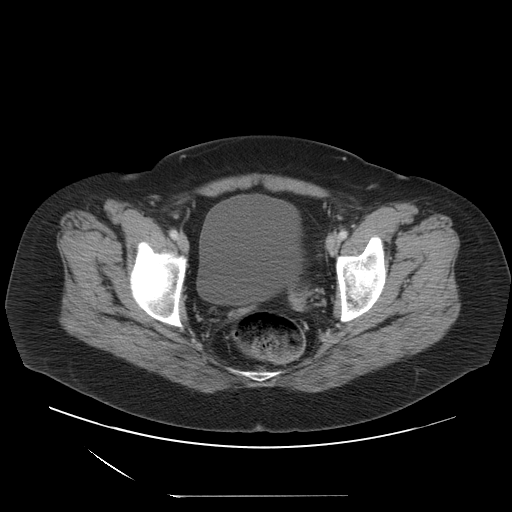
[im 18/90  soft-tissue]
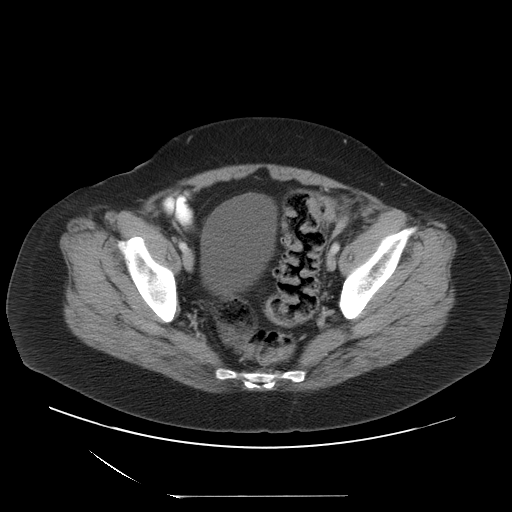
[im 23/90  soft-tissue]
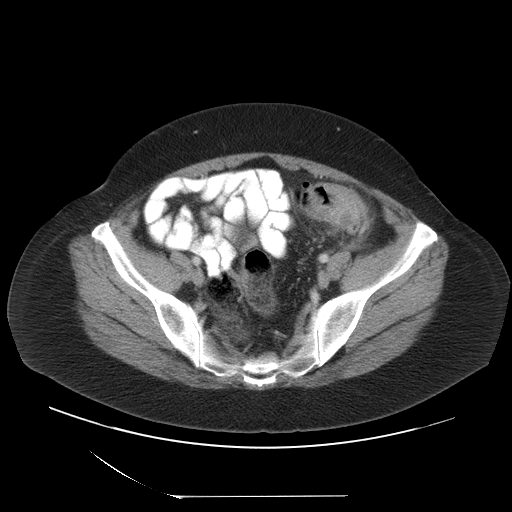
[im 32/90  soft-tissue]
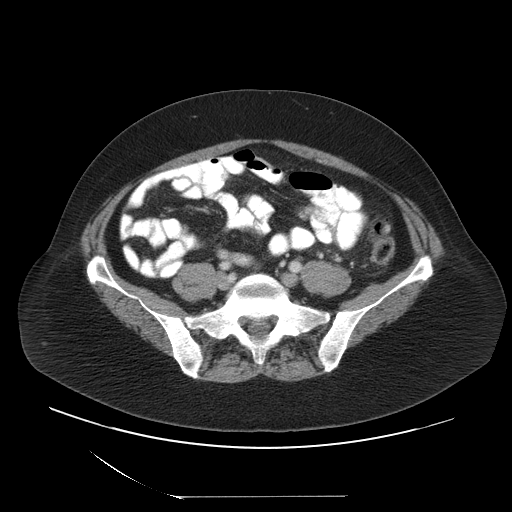
[im 36/90  soft-tissue]
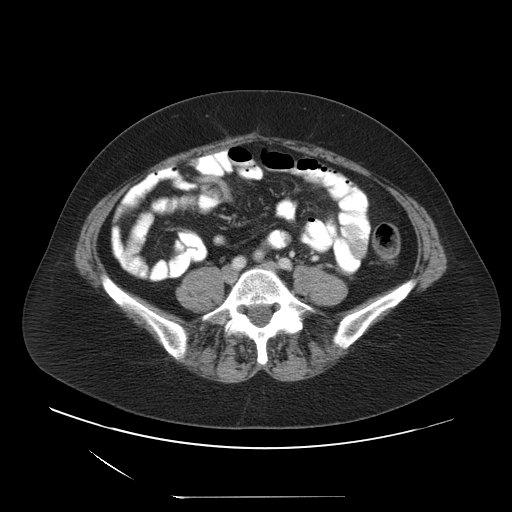
[im 41/90  soft-tissue]
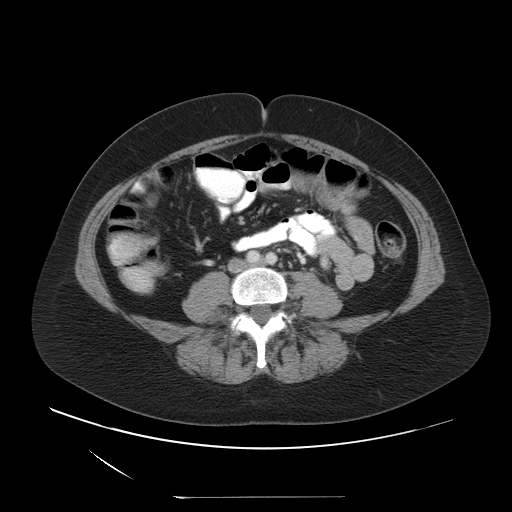
[im 49/90  soft-tissue]
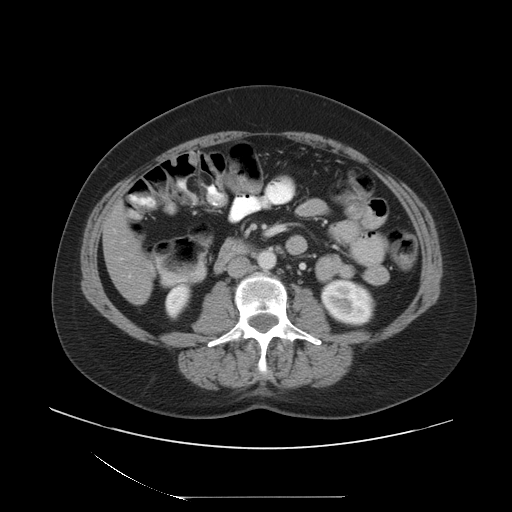
[im 54/90  soft-tissue]
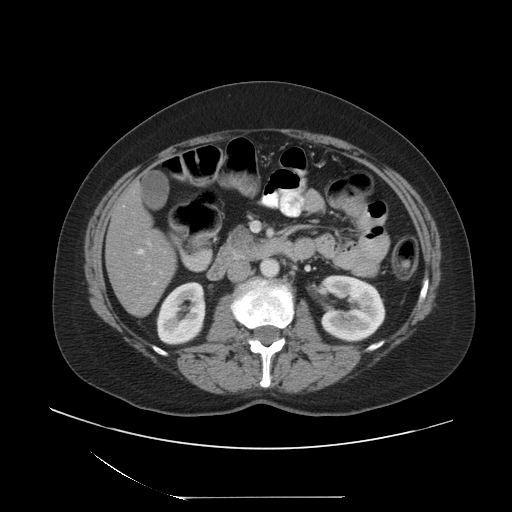
[im 54/90  bone]
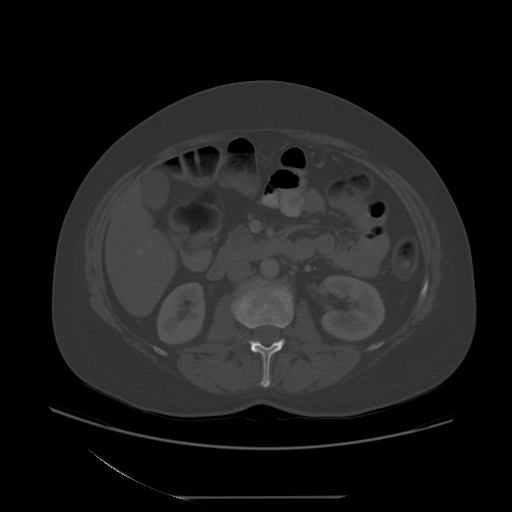
[im 58/90  soft-tissue]
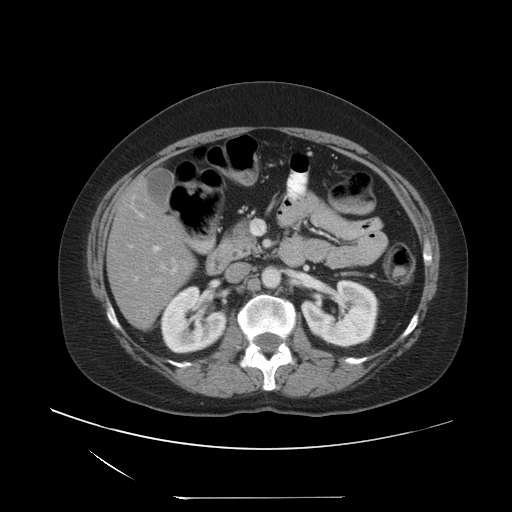
[im 67/90  soft-tissue]
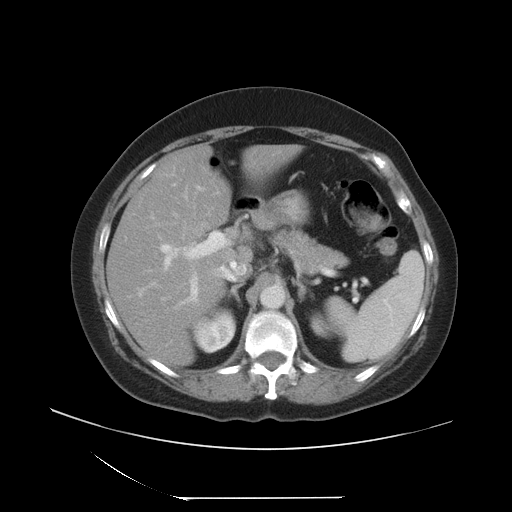
[im 72/90  soft-tissue]
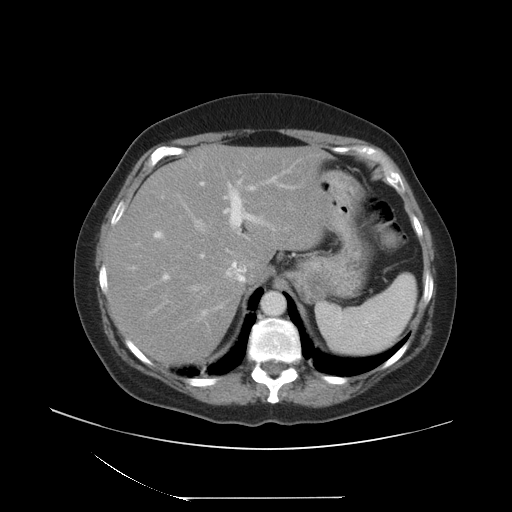
[im 76/90  soft-tissue]
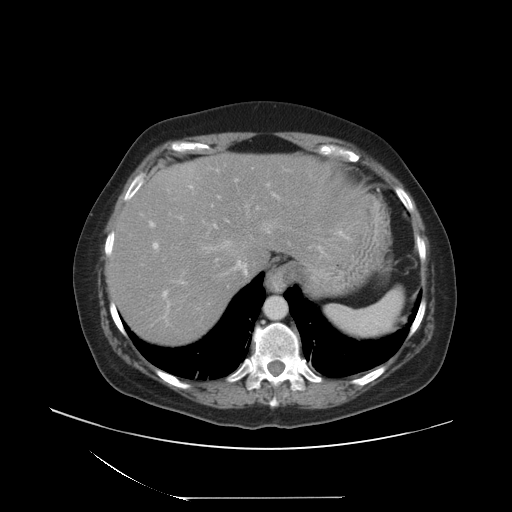
[im 85/90  soft-tissue]
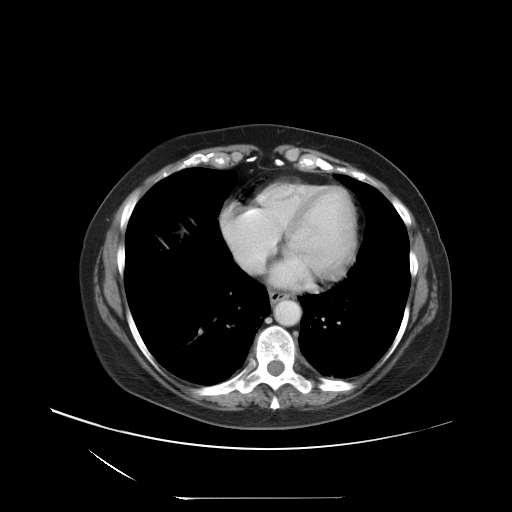

[Series 401: cor abd/pel · coronal · 0.90mm/px · 3 of 140 slices shown]
[im 47/140  soft-tissue]
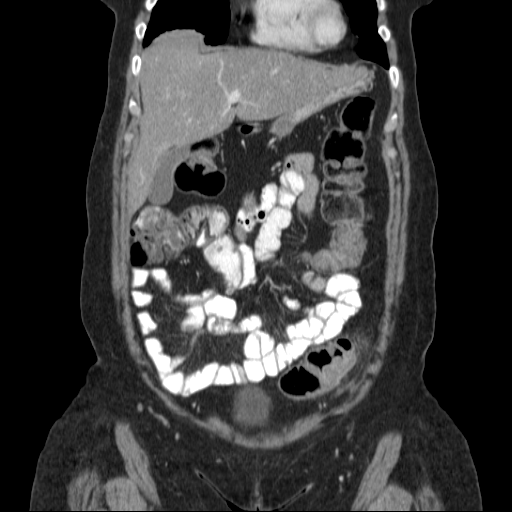
[im 62/140  soft-tissue]
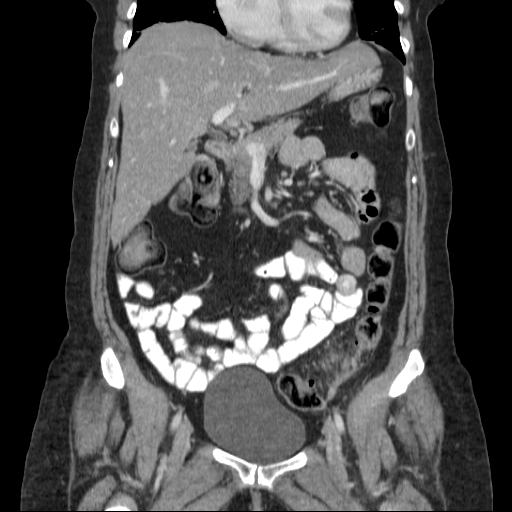
[im 78/140  soft-tissue]
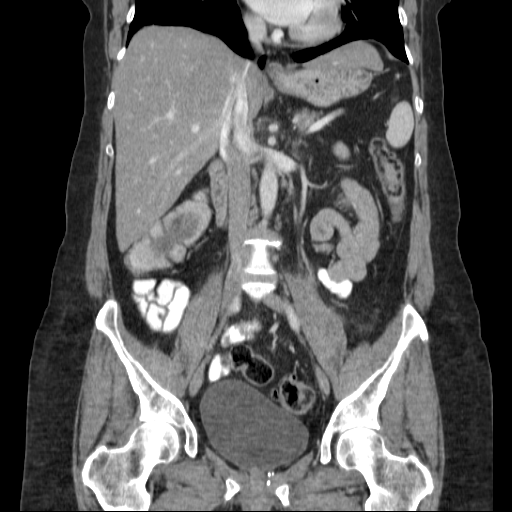

[17 of 46 positions shown; findings below may reference images not displayed]

FINDINGS: The lung bases demonstrate bibasilar atelectasis and
scarring.  No infiltrates or effusions.  The liver demonstrates
diffuse fatty infiltration which is stable.  No focal hepatic
lesions or biliary dilatation.  The gallbladder is unremarkable.
The common bile duct is normal in caliber.  The pancreas is stable
in appearance with focal fatty change in the pancreatic head.  The
spleen is normal in size.  The adrenal glands and kidneys are
unremarkable.

No mesenteric or retroperitoneal masses or adenopathy.  Small
scattered nodes are again demonstrated.  No significant bony
findings.
IMPRESSION: 1.  No acute abdominal findings, mass lesions or adenopathy.
2.  Stable diverticulosis of the sigmoid colon.
3.  Stable diffuse fatty infiltration of the liver.

CT PELVIS
FINDINGS: There is diverticulitis involving the upper sigmoid
colon with marked colonic wall thickening and pericolonic
inflammatory change. No free air or focal diverticular abscess.
The bladder is mildly distended.  No pelvic mass or adenopathy.
The patient has had a hysterectomy.  The bony pelvis is intact.  No
inguinal mass or hernia.
IMPRESSION: 1.  Uncomplicated diverticulitis of the upper sigmoid colon.

## 2010-07-14 IMAGING — CR DG HIP (WITH OR WITHOUT PELVIS) 2-3V*L*
3 series · 3 of 3 positions shown · non-contrast
Comparison: 11/16/2006

CLINICAL DATA: Left low back and left hip pain status post fall

LEFT HIP - COMPLETE 2+ VIEW

[t pelvis a.p.]
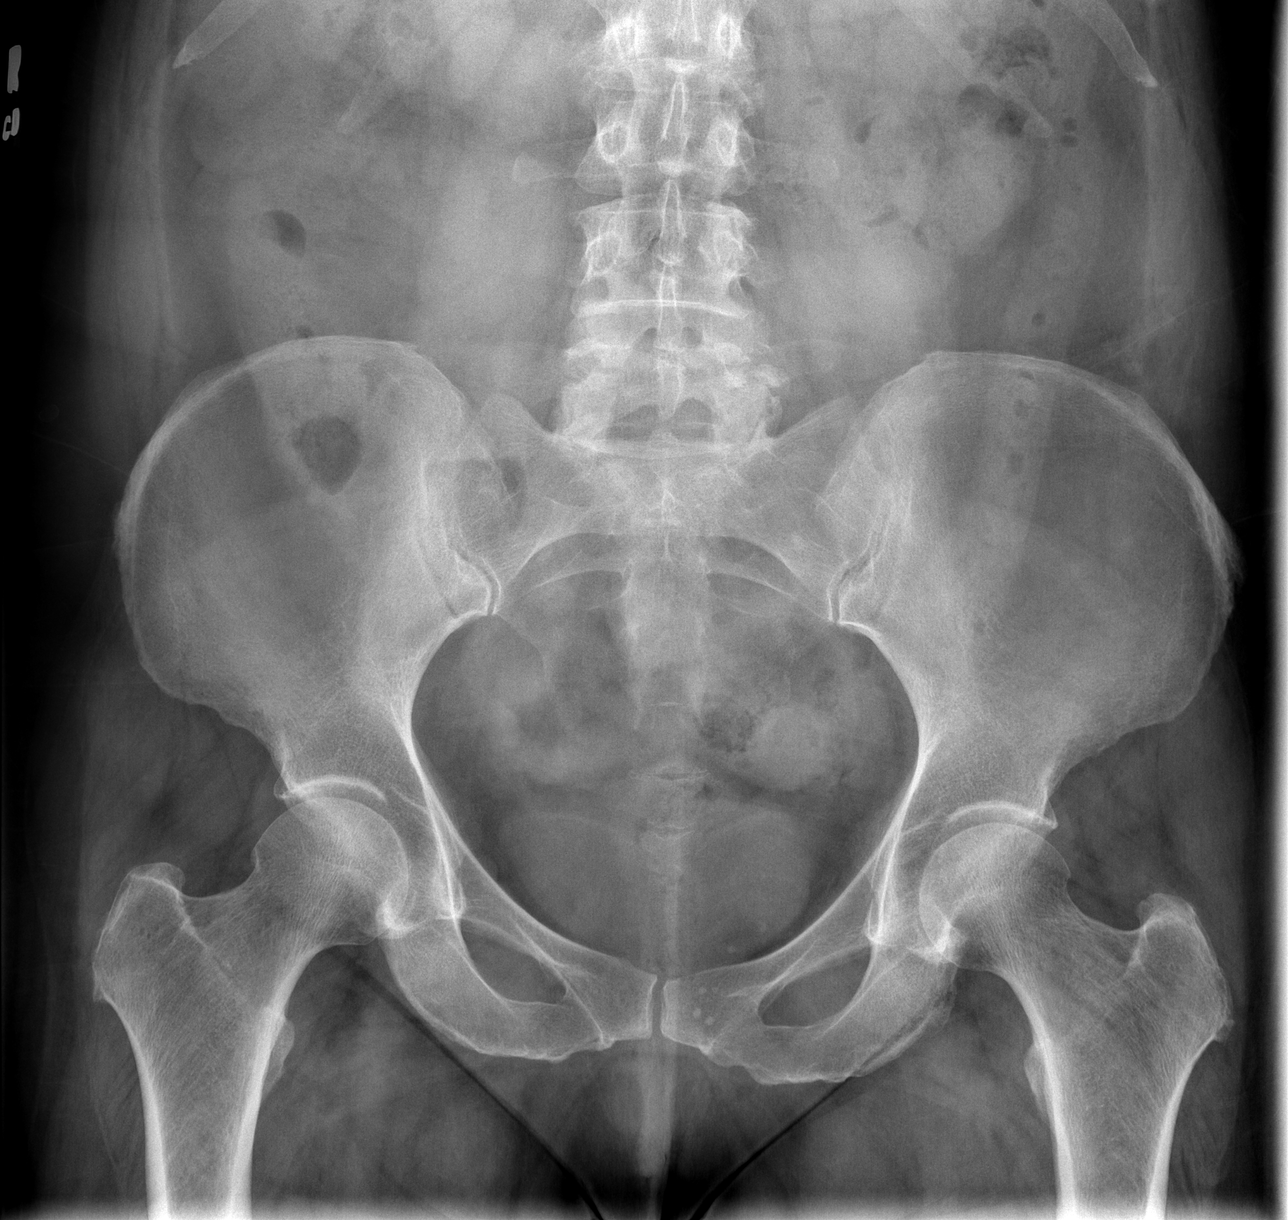

[t hip ap left]
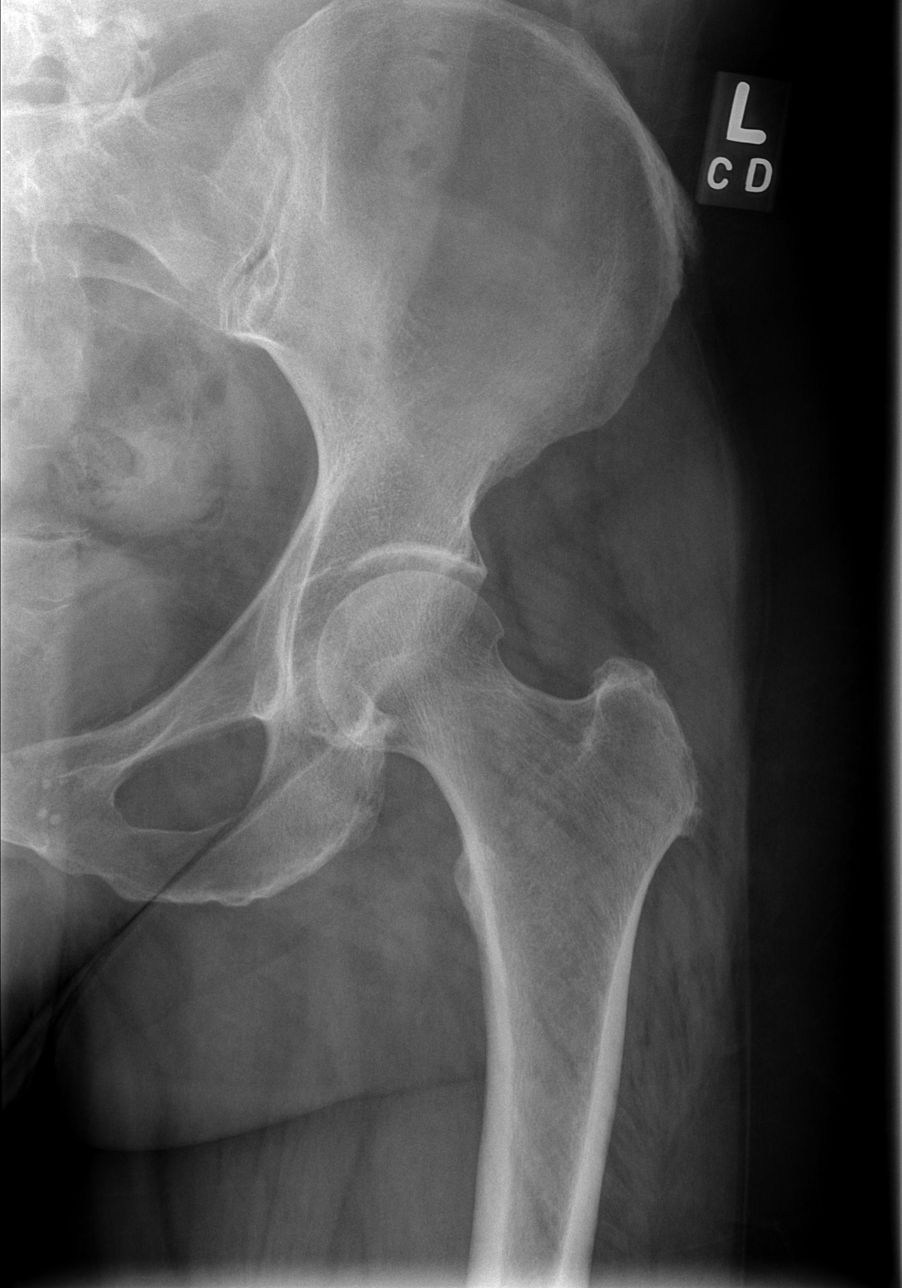

[t hip frog leg left]
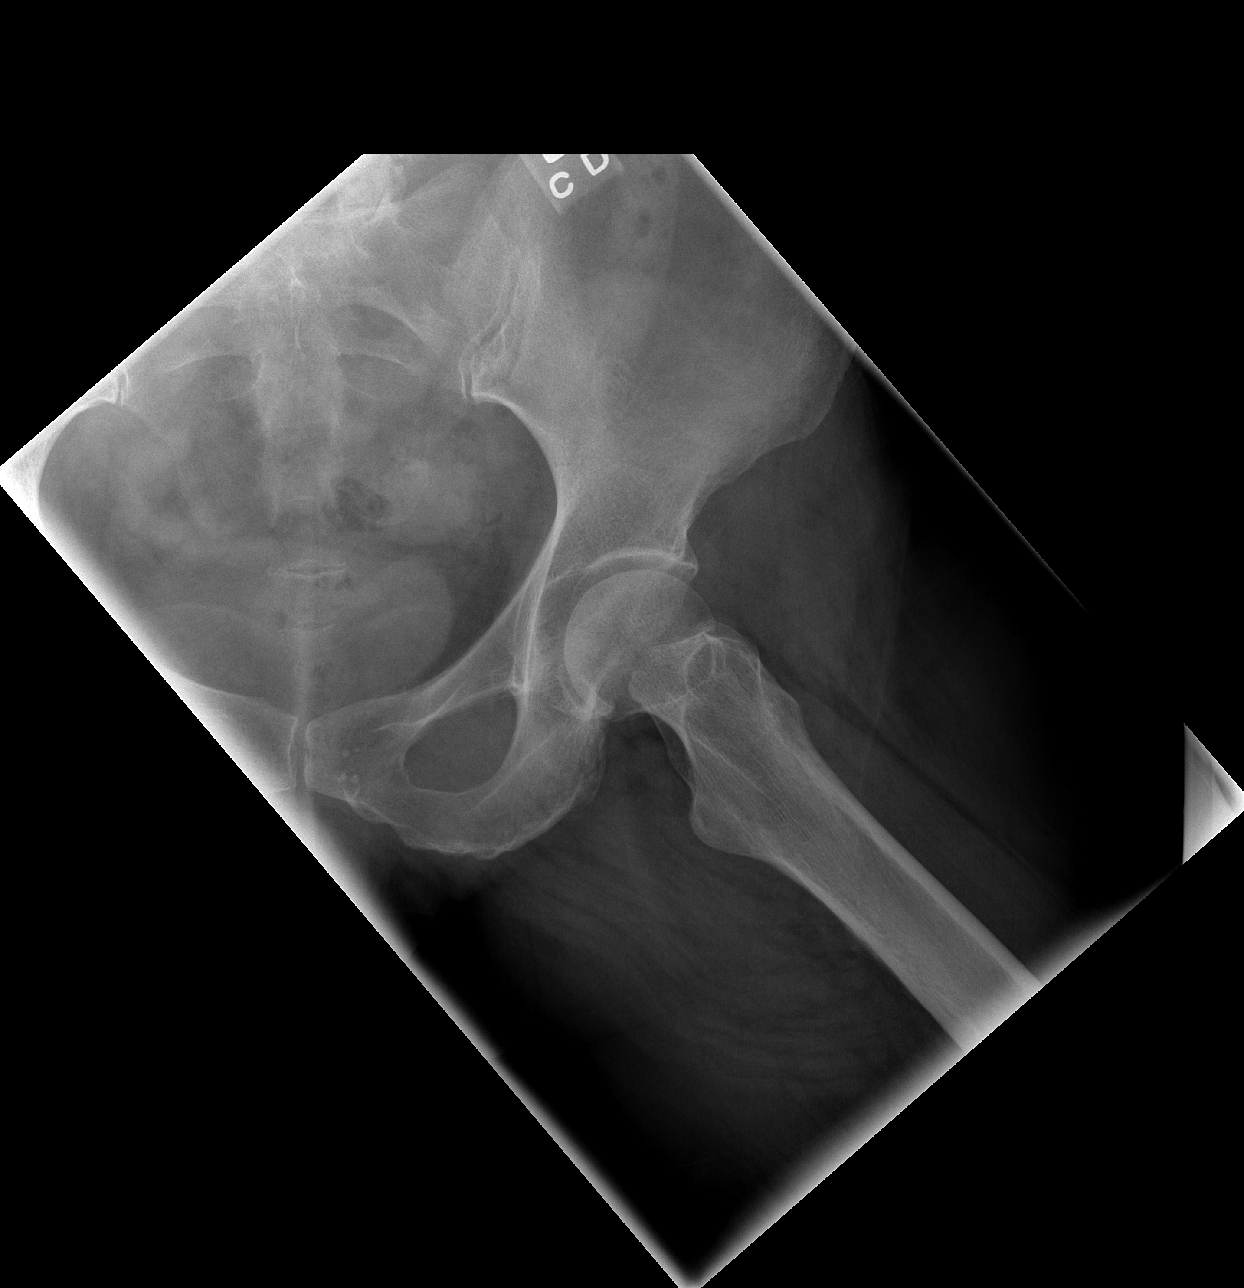

[3 of 3 positions shown; findings below may reference images not displayed]

FINDINGS: Diffuse bony demineralization.
Bony pelvis intact.
Symmetric appearance of proximal femora without fracture or
dislocation.
Tiny left pelvic phleboliths.
Hip and SI joints symmetric.
Soft tissues unremarkable.
IMPRESSION: No acute abnormalities.
Bony demineralization.

## 2010-07-14 IMAGING — CR DG LUMBAR SPINE COMPLETE 4+V
5 series · 5 of 5 positions shown · non-contrast
Comparison: 12/01/2006

CLINICAL DATA: Low back and left hip pain status post fall

LUMBAR SPINE - COMPLETE 4+ VIEW

[t l-spine a.p.]
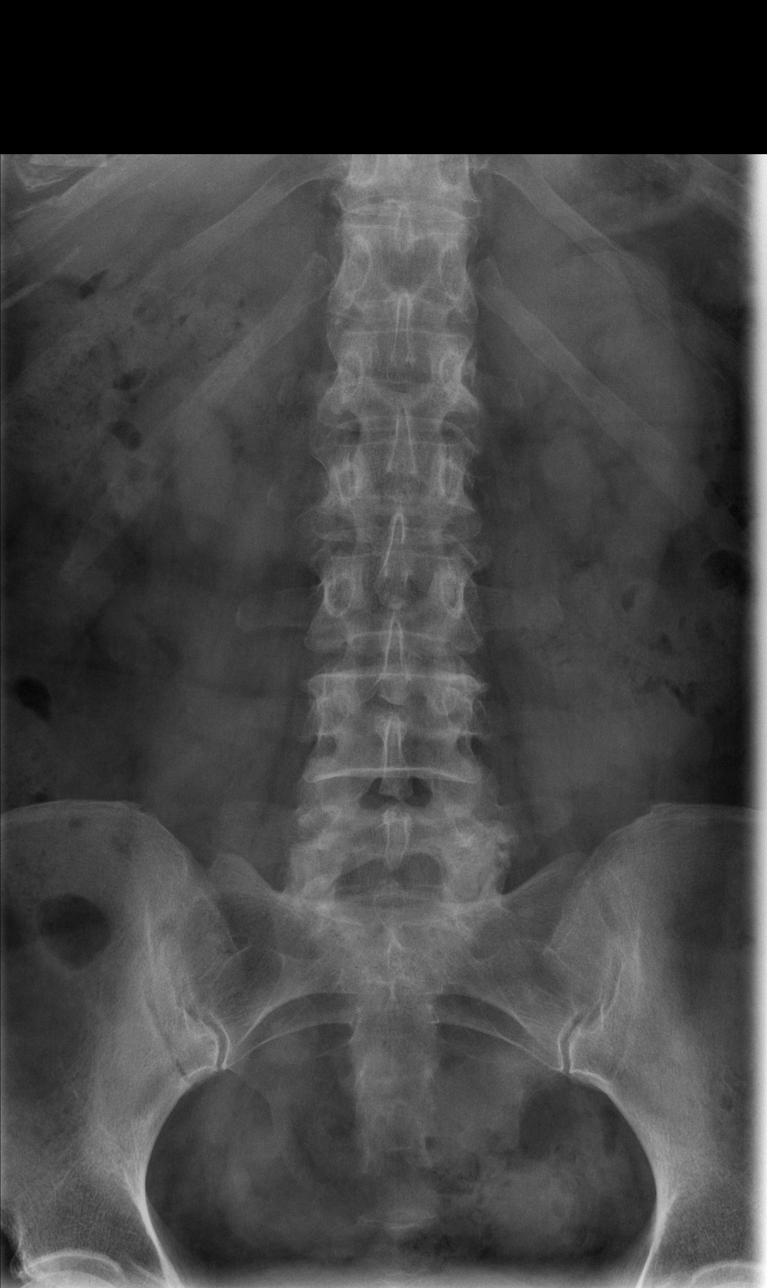

[t l-spine oblique exposure (1 of 2)]
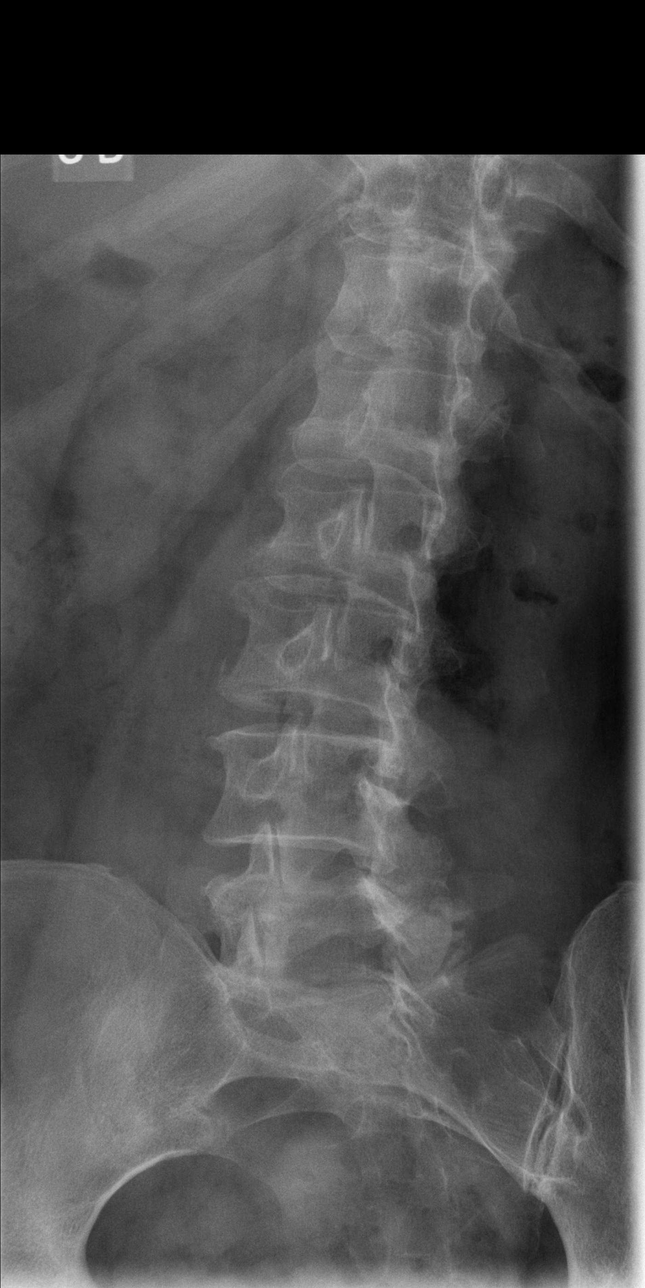

[t l-spine oblique exposure (2 of 2)]
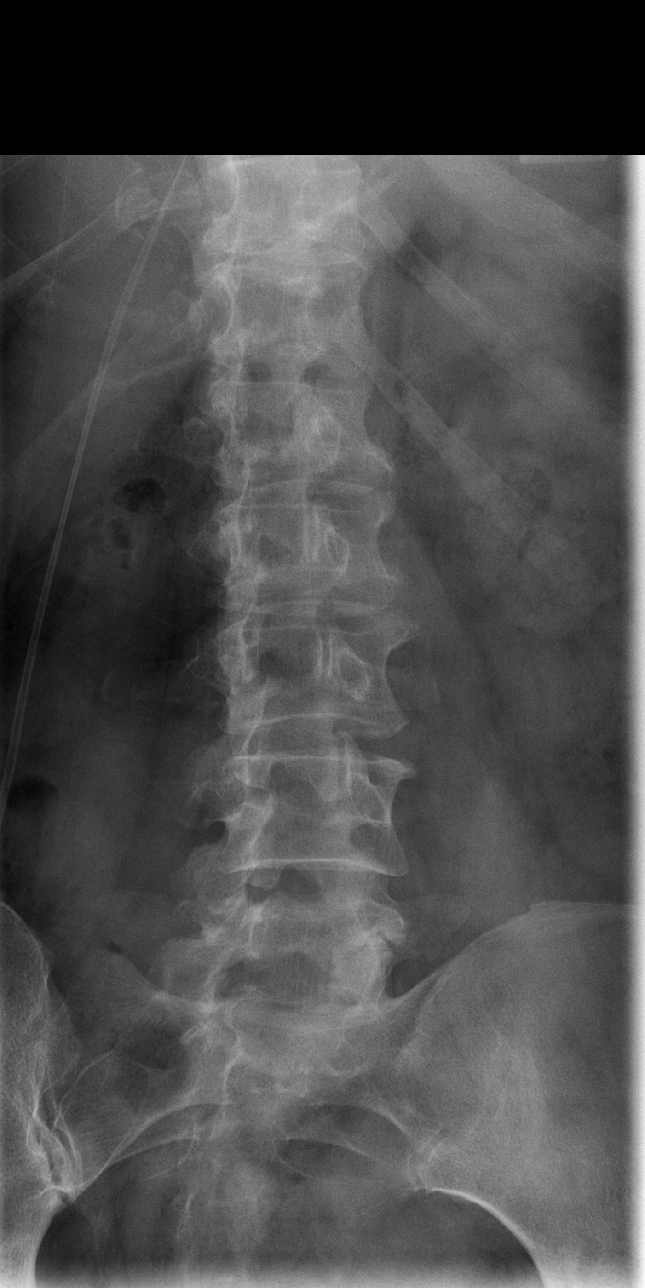

[t l-spine lat]
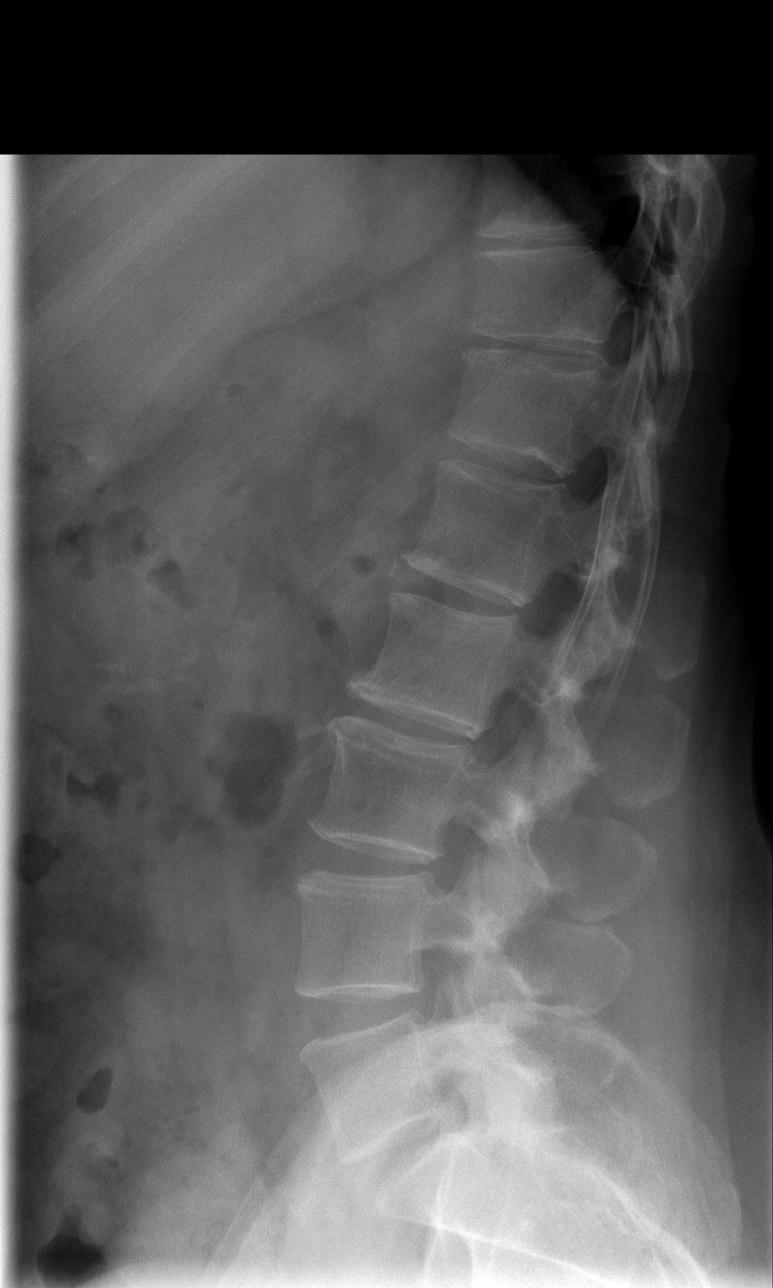

[t l-spine l5-s1 spot]
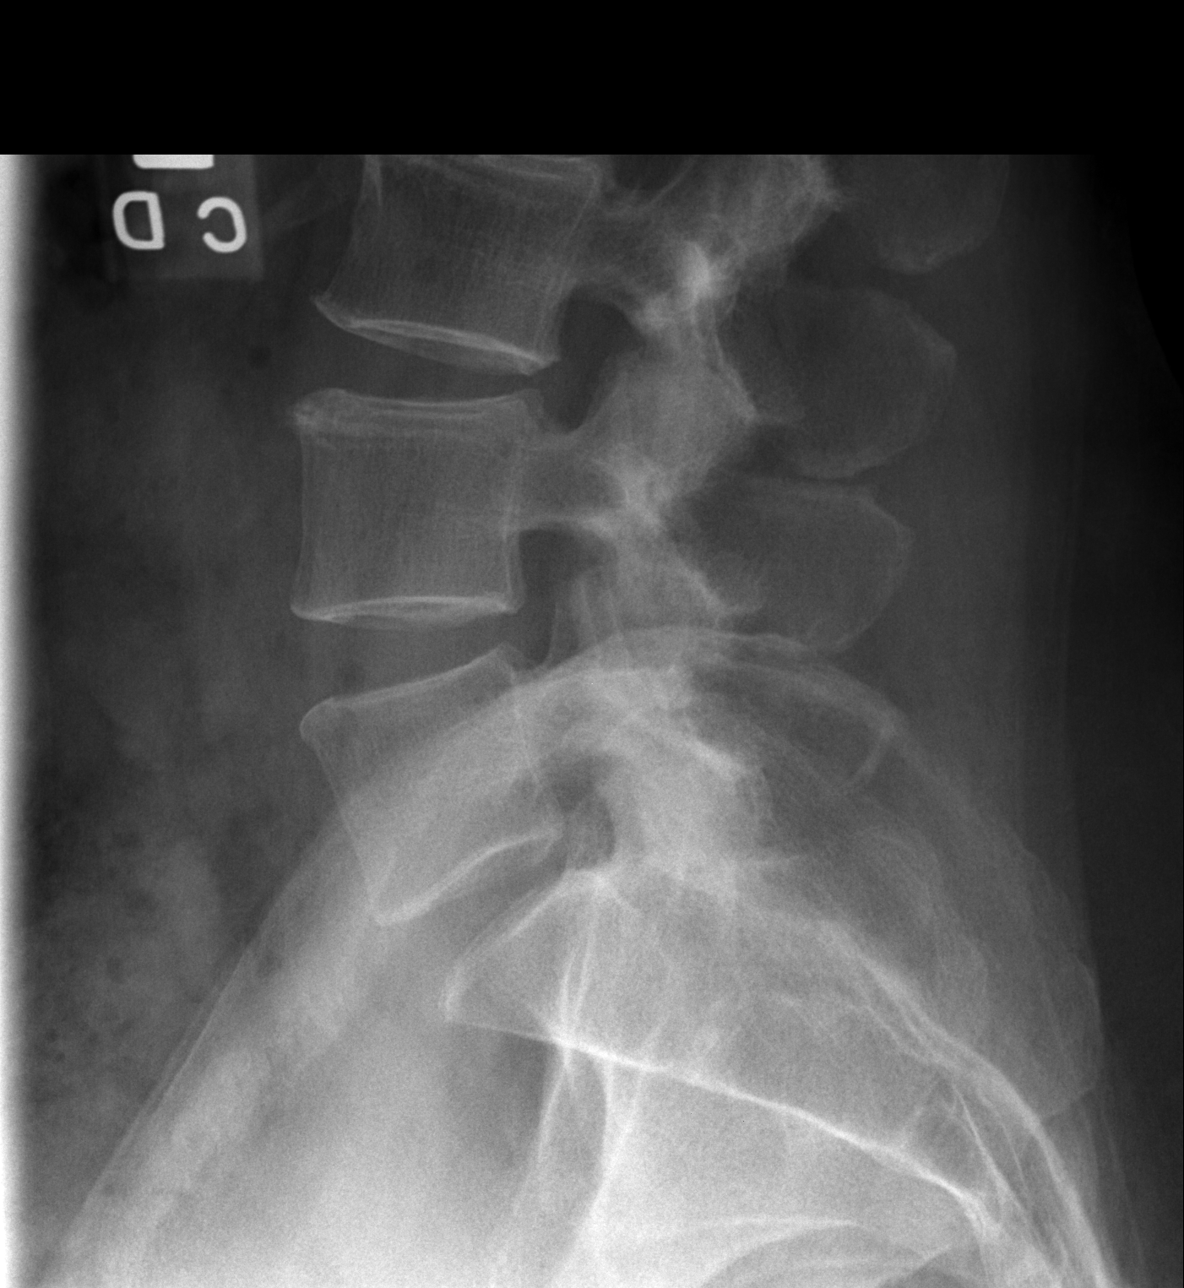

[5 of 5 positions shown; findings below may reference images not displayed]

FINDINGS: Five lumbar vertebrae.
Diffuse bony demineralization.
Mild facet degenerative changes lower lumbar spine.
Disc space narrowing with minimal spur formation L1-L2 and L2-L3.
No acute fracture or subluxation.
No spondylolysis or bone destruction.
IMPRESSION: Bony demineralization with minimal degenerative disc disease
changes.
No acute abnormalities.

## 2010-10-11 LAB — URINALYSIS, ROUTINE W REFLEX MICROSCOPIC
Bilirubin Urine: NEGATIVE
Ketones, ur: NEGATIVE mg/dL
Nitrite: NEGATIVE
Specific Gravity, Urine: 1.011 (ref 1.005–1.030)
Urobilinogen, UA: 0.2 mg/dL (ref 0.0–1.0)

## 2010-10-11 LAB — CBC
MCH: 29.6 pg (ref 26.0–34.0)
MCHC: 34.4 g/dL (ref 30.0–36.0)
Platelets: ADEQUATE 10*3/uL (ref 150–400)
RBC: 5.16 MIL/uL — ABNORMAL HIGH (ref 3.87–5.11)

## 2010-10-11 LAB — DIFFERENTIAL
Eosinophils Relative: 1 % (ref 0–5)
Lymphs Abs: 1.8 10*3/uL (ref 0.7–4.0)
Monocytes Relative: 7 % (ref 3–12)
Neutro Abs: 5.3 10*3/uL (ref 1.7–7.7)

## 2010-10-11 LAB — COMPREHENSIVE METABOLIC PANEL
AST: 19 U/L (ref 0–37)
Albumin: 3.4 g/dL — ABNORMAL LOW (ref 3.5–5.2)
Calcium: 9 mg/dL (ref 8.4–10.5)
Creatinine, Ser: 0.64 mg/dL (ref 0.4–1.2)
GFR calc Af Amer: >60 mL/min (ref >60)

## 2010-12-10 NOTE — Letter (Signed)
May 31, 2007    Lanora Manis A. Percival  [address]  [place], [place] [address]   RE:  MAGDA, MUISE  MRN:  [id number]  /  DOB:  1951-02-13   Dear Ms. Temples:   After our last visit it has become necessary to inform you that we will  longer be able to provide services for you here at [facility],  this is due to high suspicion for narcotic abuse and inability to follow  the pain contract as you have signed.  Due to this problem, we will no  longer be able to provide services for you here.   Since you are in need of further medical care, we can at any time  forward your medical records to any new physician you may choose.   Please consider the [facility] Care Physician Referral Service  for information on a new primary care physician.    Sincerely,      Corwin Levins, MD  Electronically Signed    JWJ/MedQ  DD: 05/31/2007  DT: 06/01/2007  Job #: [id number]

## 2010-12-10 NOTE — H&P (Signed)
NAME:  Dorothy White, Dorothy White            ACCOUNT NO.:  [id number]   MEDICAL RECORD NO.:  [id number]          PATIENT TYPE:  IPS   LOCATION:  0502                          FACILITY:  [facility]   PHYSICIAN:  Vic Ripper, P.A.-C.DATE OF BIRTH:  March 06, 1951   DATE OF ADMISSION:  06/11/2007  DATE OF DISCHARGE:                       PSYCHIATRIC ADMISSION ASSESSMENT   IDENTIFYING INFORMATION:  This is a voluntary admission to the services  of Dr. Dub Mikes.  The patient presented as a walk-in yesterday.  This was to  help her with withdrawal from opiates.  She stated that Thursday evening  she took her first dose of Suboxone.  This was issued to her through the  [facility].  However she became confused.  She was very irritable.  She was fidgeting.  She stated she just did not know what to do, and she  is afraid that she has lost her employment.  She has been employed at  [employer] and basically she came here to have her withdrawal  treated.  She stated that she found drugs in 1991.  This was after being  treated for migraines.  In 1995 she hurt her back, and her drug of  choice has escalated each time she has had an injury.  November 3, Oliver Barre discharged her from his practice because of the high suspicion that  she was having narcotic abuse and inability for her to follow her signed  pain contract.   PAST PSYCHIATRIC HISTORY:  She states she had taken Prozac 20 years for  depression.  She was admitted in 1999 to [facility].  She did the psych IOP  here at the [facility] back in July, and recently started  at the [facility], however she chooses to not return there.   SOCIAL HISTORY:  She has had one year of college.  She has been married  and divorced twice, no children.  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  She is afraid that she may be  terminated due to her absence from problems with her trying to withdraw  by herself from the  opiates.   FAMILY HISTORY:  Her father suicided in 34.  This was 5 years after  her mother had died.  Her mother died when she was 39.  He was manic at  the time.  She is unaware of whether he had any underlying mood  disorder.   ALCOHOL AND DRUG ABUSE:  She states that she first started using  OxyContin at age 20.   PAST MEDICAL HISTORY:  Medical problems are chronic pain.   MEDICATIONS:  Her medication reconciliation shows Toprol 25 mg p.o.  daily, Lipitor 10 mg p.o. daily, Celexa 40 mg p.o. daily, Ambien CR 12.5  mg at h.s. p.r.n., BuSpar 7.5 mg b.i.d. and Xanax 1 mg p.o. t.i.d.  p.r.n.   ALLERGIES:  PENICILLIN   POSITIVE PHYSICAL FINDINGS:  PHYSICAL EXAMINATION:  She has reports of  chronic pain in her neck and low back, however she flexes easily.  She  pulls her legs up.  She has no immediate complaints  today.  She does  have some bruises on her buttocks and below her right breast.  She fell  a couple of week ago.  Her vital signs show she is 5 feet 8 inches,  weighs 138.  Temperature is 97.8, blood pressure is 134/82 to 152/87.  Pulse is 71 to 78, respirations were 24.  Her labs are pending.   MENTAL STATUS EXAM:  She is alert and oriented.  She is casually groomed  and dressed and nourished.  Her speech is a normal rate, rhythm and  tone.  Her mood is appropriate to the situation.  Her affect has a  normal range.  Thought processes are clear, rational and goal oriented.  Judgment and insight are intact.  Concentration and memory are intact.  Intelligence is at least average.  She is not suicidal or homicidal.  She does not have any auditory or visual hallucinations.  She is not  currently experiencing withdrawal.   ADMISSION DIAGNOSES:  AXIS I:  Major depressive disorder, no psychotic  features, opioid dependence, here for withdrawal.  AXIS II:  Deferred.  AXIS III:  Cervical spondylosis with radiculopathy.  AXIS IV:  Problems with primary support group, occupational  and economic  issues.  AXIS V:  Global assessment of function is 35.   PLAN:  Plan is to add Abilify 5 mg to Celexa.  She needs to be able to  continue her medications post discharge.  We will have the case manager  check on Monday to see if she still has a job.  She may need followup at  [facility] and [facility].   ESTIMATED LENGTH OF STAY:  4-5 days.      Vic Ripper, P.A.-C.     MD/MEDQ  D:  06/12/2007  T:  06/13/2007  Job:  [id number]

## 2010-12-10 NOTE — Letter (Signed)
May 12, 2007    Dorothy White  [address]  [place], [place] [address]   RE:  Dorothy White, Dorothy White  MRN:  [id number]  /  DOB:  22-May-1951   Dear Ms. Nodarse,   It has come to my attention via a phone call from Dr. Wynn Banker of Pain  Management, that you were seen by Dr. Wynn Banker in the recent past for  chronic pain.  You, of course, have seen [facility] and Dr.  Wynn Banker for pain management today and it is my duty to inform you that  I am no longer treating chronic pain for long term purposes.  The  prescription we gave you on May 07, 2007 should be our last refill  on the medications.  I would encourage you to follow up with Dr.  Wynn Banker, or if you prefer, we can try to refer you to a new pain  center.  You may recall that with your pain medication contract it  clearly states that early refills will not be done and in no  circumstance should medication be used more than prescribed.   Because of the situation, should you request a new primary care  physician in [place], we would be most happy to forward your records  at any time.    Sincerely,      Corwin Levins, MD  Electronically Signed    JWJ/MedQ  DD: 05/12/2007  DT: 05/13/2007  Job #: [id number]

## 2010-12-10 NOTE — Consult Note (Signed)
REASON FOR CONSULTATION:  Consult requested for the evaluation of neck  pain and low back pain.   HISTORY:  The patient is a 60 year old female who has a history of  frequent falls starting in 1995. She fell at work, had another fall in  2000. She denies any loss of consciousness leading to her falls. She had  a fall earlier this spring as well. She has had x-rays showing T11, T12  minor anterior wedging but it was difficult to say when the onset was.  She has not had any vertebroplasty or kyphoplasty.   She has more recently developed neck pain and also had bilateral upper-  extremity pain and tingling. She has had MRI of the cervical spine while  living in [place] 09/25/05, and this demonstrated a moderate, left,  paracentral disc herniation as well as moderate disc osteophyte complex.  She had anterior flattening of the cord, left side only. She had disc  osteophyte complex at C5-6, left greater than right. She had reportedly  had an upper-extremity EMG which was normal. She has moved back to  [facility] within the last year and has been seen at [facility] for  her primary care for history of falls, thoracic fracture and her post-  menopausal state. She has undergone DEXA scanning which demonstrated  osteopenia. She scored -1.1 at the lumbar spine and 1.2 at the femoral  neck. She had been recommended to be on calcium plus vitamin D but only  takes it once a day.   She has had treatment in the pain clinic while living in [place] last  year. Had a cervical epidural which was helpful when she was having her  neck pain and arm pain. Her arm pain did get better after the epidural  as did her neck pain. In the past she has had lumbar injections and she  thinks these were epidurals as well but did not think that these were  particularly helpful for her.   PAST MEDICAL HISTORY:  Her past medical history is also positive for  depression and anxiety. She actually went to behavioral health  because  of depression. She has been placed on Celexa and is also on trazodone  for her sleep at night. She has a follow-up psychiatric visit with Dr.  [name]. Has seen Dr. Ladona Ridgel at behavioral health. Other past  medical history, she has had ED visit for abdominal pain in July of 08.  CBC and BMET were normal. She had a CT of the abdomen at that time which  showed some sigmoid diverticulosis but no diverticulitis.   Her pain characteristics are described as constant, sharp, dull, aching  and this is mainly in the neck and low back. Her low back is actually  worse than her neck. She has some pain with general activity. Her sleep  is poor. The pain is worse with sitting and bending, as well as lying  down. She can walk 25 minutes at a time. She climbs steps. She drives.  She works at a desk job at a Programme researcher, broadcasting/film/video.   REVIEW OF SYSTEMS:  Positive for depression, anxiety but negative for  suicidal thoughts. She complains of numbness in the feet as well as the  little fingers. She has some night sweats and poor appetite.   PAST SURGICAL HISTORY:  Her past surgical history is significant for  bunionectomy 2004, 2005, hysterectomy in 1999, oophorectomy in 2000 and  rhinoplasty in 1978 as well as tonsillectomy in 1976.  SOCIAL HISTORY:  Divorced. Lives with a friend. Moved from [place] less  than a year ago. Denies any alcohol or drug use.   FAMILY HISTORY:  Heart disease. High blood pressure. Psychiatric  problems. Cancer.   PHYSICAL EXAMINATION:  GENERAL APPEARANCE: Her examination demonstrates  a well-developed, well-nourished female in no acute distress.  VITAL SIGNS: Blood pressure is 122/75, pulse 92, respirations 20, 02 sat  96% on room air.  EXTREMITIES: She has bunion on the left foot and has had bunionectomy  bilaterally but on the right side has no residual bunion. She has  callous formation bilateral great toes. Bunionectomy scars are  nontender. Toes are pink, no  evidence of cyanosis. Sensation reduced S1  dermatome bilaterally to sharp touch. In the upper extremities she has  reduced sensation bilateral index fingers.  NECK: Her neck as negative Spurling maneuver. She has pain mainly with  extension. She has normal upper and lower extremity strength and range  of motion. Balance is mildly diminished and she has mild difficulty with  tandem gait although she does not tilt to the side or fall. She is able  to toe walk and heel walk and her Romberg is negative.   LABORATORY DATA:  Pearlean Brownie testing is negative at the SI joints but has  some tightness and pain in her left hip.   IMPRESSION:  1. Lumbar pain. Review of E-chart x-rays reveals facet arthropathy at      L4-5, L5-S1. The pain is mainly axial and mainly with extension. I      think this is consistent with a lumbar facet syndrome. Given that      she has had no recent MRI of the lumbar spine and facet injections      are considered, will need to repeat lumbar MRI.  2. Cervical spondylosis with history of radiculopathy. Her sensory      exam in the upper extremities is most consistent with carpal tunnel      but reportedly had a normal EMG. Will start her on some Neurontin      100 mg at bedtime and working up to q.i.d. over 1 month's time.      Will also refer for cervical epidural which can be done next month.  3. Intermittent lower-extremity sensory symptoms. Will try to      correlate that with an MRI, but she also complains of a more      diffuse numbness in the feet. Given her history of falls, I would      like to check her for peripheral neuropathy with EMG. Will schedule      for that.  4. In terms of her medications will start tramadol ER 100 mg a day.      She can continue her Naproxen 1 tablet twice a day and trial of      Lidoderm patch, on 12, off 12 in addition to her Neurontin. We will      review her urine drug screen and if negative for illicit drugs and      nondisclosed  opiates, we can resume her narcotic analgesic      prescription.   Thank you for this interesting consultation. Will keep you apprised of  her progress.      Erick Colace, M.D.  Electronically Signed     AEK/MedQ  D:03/09/2007 16:16:30  T:03/10/2007 18:16:14  Job #:  [id number]   cc:   [name], M.D.  Fax: [phone]

## 2010-12-10 NOTE — H&P (Signed)
NAME:  Dorothy White, Dorothy White NO.:  [id number]   MEDICAL RECORD NO.:  [id number]          PATIENT TYPE:  INP   LOCATION:  0101                         FACILITY:  [facility] [place] [facility]   PHYSICIAN:  Vania Rea, M.D. DATE OF BIRTH:  [birth year]   DATE OF ADMISSION:  02/27/2008  DATE OF DISCHARGE:                              HISTORY & PHYSICAL   PRIMARY CARE PHYSICIAN:  [facility]/unassigned.   DICTATING PHYSICIAN:  Vania Rea, MD.   CHIEF COMPLAINT:  Progressive lower abdominal pain, nausea and bloating.   HISTORY OF PRESENT ILLNESS:  This is a 60 year old Caucasian lady with a  history of diverticulitis and a past history of narcotic addiction  successfully weaned who for the past 1-1/2 weeks has been having  intermittent lower abdominal pain associated with nausea.  Patient says  the symptoms are similar to her episode of diverticulitis 2 years ago,  but also notes that she has been having constipation alternating with  diarrhea for some time now.  She has loose diarrheal stools about 2-3  times a day, occurring about every other day, which has been going on  for a few weeks.  For periods of constipation she takes MiraLax.  The  patient came to the emergency room to be evaluated and was noted to have  abnormal liver functions as well as an abnormality of the pancreas.  The  hospitalist service was called to assist with management.  The patient  denies any significant Tylenol use.   PAST MEDICAL HISTORY:  1. Depression.  2. A rapid heart rate for which she has been given Toprol.  3. History of diverticular disease.  4. Past history of OxyContin addiction successfully treated.   MEDICATIONS:  1. Lipitor 10 mg daily.  2. Toprol XL 25 mg daily.  3. Prozac 40 mg daily.  4. MiraLax p.r.n.   ALLERGIES:  PENICILLIN CAUSES HIVES.   SOCIAL HISTORY:  Denies tobacco abuse, discontinued 5 years ago but used  to smoke 1-pack per day.  Denies alcohol use.  Denies illicit drug  use.   FAMILY HISTORY:  Significant for a mother who died of lymphoma, a  brother who received radiation therapy for cancer of the base of the  tongue and a father who died of an acute MI.   REVIEW OF SYSTEMS:  Significant for a colonoscopy about 10 years ago  done by [facility] which she said demonstrated only internal and  external hemorrhoids.  Patient also notes that she has been gaining  weight over past 6 months.  Other than this, the 10-point review of  systems is unremarkable.   PHYSICAL EXAMINATION:  A pleasant middle-aged Caucasian lady lying flat  in bed.  VITALS:  Temperature is 97.3, pulse 83, respirations 20, blood pressure  132/78.  Her pupils are round, equal and reactive.  Mucous membranes  pink and anicteric.  She is mildly dehydrated.  She is mildly tender in  the submandibular area bilaterally but there is no lymphadenopathy, no  thyromegaly.  Her throat she has white patches in the posterior  oropharynx, although she denies any sore throat; it does not look  like  Candida, it looks more like leukoplakia.  CHEST:  Clear to auscultation bilaterally.  CARDIOVASCULAR SYSTEM:  Regular rhythm.  ABDOMEN:  Is obese and soft but she is puffy in the epigastric region  and she is tender, she is also sore in her lower abdomen bilaterally.  Bowel sounds are normal throughout.  EXTREMITIES:  Without edema.  There is no tenderness or swelling.  She  has 2+ pulses bilaterally.  CENTRAL NERVOUS SYSTEM:  Cranial nerves II-XII are grossly intact.  She  has no focal neurologic deficit.   LAB:  Her CBC is unremarkable.  Her serum chemistry is likewise  unremarkable.  Her liver function test is significant for a total  protein of 6.5, albumin of 3.7, AST 48, ALT 85 and alk phos 125.  They  are all mildly elevated.  Her bilirubin is normal, her lipase is normal.  Urinalysis shows a specific gravity of 1.022, it is otherwise  unremarkable.  CT scan of the abdomen and pelvis  demonstrates  hyperattenuation of the head of the pancreas with diffuse fatty  infiltration of the liver and extensive sigmoid diverticulosis.  Her EKG  shows normal sinus rhythm, she has inverted V-waves in V1-V4 and  flattening of the T-waves in V5 and V6.   ASSESSMENT:  1. Lower abdominal pain likely due to diverticular disease.  2. Abnormal CT scan of the pancreas, possible pancreatic tumor.  3. Abnormal EKG, possibly due to cardiac ischemia.   PLAN:  This lady's problems can probably be managed on outpatient basis  but because of her lack of medical follow-up they probably will not be  attended to expeditiously.  Patient already knows that she has been  waiting some 2 weeks to try to get an appointment with [facility].  Plan:  Will bring this lady in on observation to start treatment of her  diverticular disease.  Will get MRI of the pancreas to be sure she does  not have an early pancreatic cancer.  She will probably need a  colonoscopy and cardiac stress testing which can be arranged as an  outpatient.  Other plans as per orders.      Vania Rea, M.D.  Electronically Signed     LC/MEDQ  D:  02/27/2008  T:  02/27/2008  Job:  [id number]   cc:   Melvern Banker  Fax: [phone]

## 2010-12-10 NOTE — Discharge Summary (Signed)
NAME:  Dorothy White, Dorothy White            ACCOUNT NO.:  [id number]   MEDICAL RECORD NO.:  [id number]          PATIENT TYPE:  INP   LOCATION:  1336                         FACILITY:  [facility]   PHYSICIAN:  Michelene Gardener, MD    DATE OF BIRTH:  11-Jun-1951   DATE OF ADMISSION:  02/27/2008  DATE OF DISCHARGE:  02/29/2008                               DISCHARGE SUMMARY   PRIMARY CARE PHYSICIAN:  [facility].   DISCHARGE DIAGNOSES:  1. Abdominal pain secondary to diverticulosis.  2. Diverticulosis.  3. Questionable pancreatic mass found to be pancreatic fat.  4. Abnormal electrocardiogram.  5. Hyperlipidemia.  6. Depression.  7. History of drug abuse.   DISCHARGE MEDICATIONS:  1. Prozac 40 mg once a day.  2. Lipitor 10 mg at bedtime.  3. Toprol XL 25 mg once a day.   CONSULTATIONS:  None.   RADIOLOGY STUDIES:  1. Abdominal x-ray on August 2 showed no evidence of acute problem.  2. CT scan of the abdomen and pelvis on August 2 showed      hypoattenuation of the pancreatic head.  CT scan of the pelvis      showed sigmoid diverticular disease without evidence of      diverticulitis.  3. MRI of the abdomen on August 3 showed focal fat deposition in the      pancreas without evidence of pancreatic head mass or acute      pancreatitis and she had hepatomegaly and fatty infiltration of the      liver.   COURSE OF HOSPITALIZATION:  1. Abdominal pain.  This patient has been having abdominal pain on and      off for a few days prior to her presentation.  She came into the ER      for further evaluation.  Abdominal x-ray was done and showed no      evidence of acute problem.  CT scan of the abdomen was done and      showed possible pancreatic mass versus fat deposits.  CT scan of      the pelvis showed diverticulosis without evidence of      diverticulitis.  The patient was admitted to the hospital for      further evaluation.  Initially, she was kept n.p.o. and then she      was started on  diet and then her diet advanced as tolerated.  At      the time of discharge, she was able to take full solid diet without      problems.  She was also put on IV fluids.  She was started on Cipro      and Flagyl.  Occult stool was sent and it came to be normal.  MRI      of the abdomen was done and showed that there is no pancreatic mass      and there is no pancreatitis and only showed fat deposits in the      pancreas in addition to fatty liver.  Her abdominal pain was felt      to be secondary to diverticulosis in addition to  possible fatty      infiltration of the liver.  At the time of discharge, she is back      to her baseline.  She does not have any abdominal pain.  There is      no nausea.  No vomiting.  She was tolerating diet well.  Cipro and      Flagyl were discontinued.  She was advised to take over-the-counter      pain medicine for pain control.  2. Pancreatic lesion as mentioned above that was ruled out by MRI.      Initially, on her scan there is questionable mass versus fatty      deposition.  The MRI confirmed the finding as fat deposits and no      further workup is needed.  3. Abnormal EKG.  The patient does not have any chest pain or      shortness of breath.  Three sets of troponin and cardiac enzymes      were done and they came to be normal.  Echocardiogram will be done      today and results will be followed.  4. Hyperlipidemia.  The patient was continued on Lipitor.  5. Depression.  She was continued on Prozac.  6. Hypertension.  She was continued on Toprol.  7. History of drug abuse.  The patient has been cleaned out.  I had      been avoiding to give any narcotics during the hospital.   Total assessment time is 40 minutes.      Michelene Gardener, MD  Electronically Signed     NAE/MEDQ  D:  02/29/2008  T:  02/29/2008  Job:  [id number]   cc:   Dala Dock

## 2010-12-10 NOTE — Discharge Summary (Signed)
NAME:  Dorothy White, Dorothy White            ACCOUNT NO.:  [id number]   MEDICAL RECORD NO.:  [id number]          PATIENT TYPE:  INP   LOCATION:  1336                         [facility]ACILITY[facility]  [facility]   PHYSICIAN:  Michelene Gardener, MD    DATE OF BIRTH:  10-13-1950   DATE OF ADMISSION:  02/27/2008  DATE OF DISCHARGE:  03/01/2008                               DISCHARGE SUMMARY   ADDENDUM:   This patient was supposed to be discharged yesterday, but her discharge  was held because she was supposed to get echocardiogram.  Her  echocardiogram was done, and it showed ejection fraction of 55-60%.  Did  not show any other significant findings.  The patient remained  asymptomatic with no chest pain or nausea or shortness of breath.  She  was advised to keep her appointment with [facility] that was scheduled  on August 20.  Otherwise, her medical conditions remained.   ASSESSMENT TIME:  30 minutes.      Michelene Gardener, MD  Electronically Signed     NAE/MEDQ  D:  03/01/2008  T:  03/01/2008  Job:  [id number]

## 2010-12-13 NOTE — Discharge Summary (Signed)
NAME:  Dorothy White, Dorothy White NO.:  [id number]   MEDICAL RECORD NO.:  [id number]          PATIENT TYPE:  IPS   LOCATION:  0502                          FACILITY:  [facility]   PHYSICIAN:  Geoffery Lyons, M.D.      DATE OF BIRTH:  1950/10/05   DATE OF ADMISSION:  06/11/2007  DATE OF DISCHARGE:  06/15/2007                               DISCHARGE SUMMARY   CHIEF COMPLAINT:  This was the first admission to [facility] for this 60 year old divorced white female, trying to stop  abusing OxyContin.  Started Suboxone through the [facility].  She had  been on opiates since 1991 where she was started on them for migraines  and then in 1995 hurt her back.   PAST PSYCHIATRIC HISTORY:  [facility] and __________ OP July 2008 and  1999 in [place].  Had been on Prozac for years for depression.  Back  under history as already stated abusing OxyContin.  No other substances.   MEDICAL HISTORY:  Chronic pain, mostly back pain and migraines.   MEDICATIONS:  1. Toprol 25 mg daily.  2. Lipitor 10 mg per day.  3. Celexa 40 mg per day.  4. Suboxone 80 mg one-half daily.  5. Ambien CR 12.5 one at night.  6. BuSpar 7.5 twice a day.  7. Xanax 1 mg three times a day as needed.   Physical exam performed and failed to show any acute findings.   LABORATORY WORKUP:  CBC; white blood cells 5.8, hemoglobin 14.5, sodium  138, potassium 3.5, glucose 103, BUN 4, creatinine 0.62.  SGOT 18, SGPT  17, PSA 1.604.  Drug screen positive for benzodiazepines.   MENTAL STATUS EXAM:  Reveals an alert cooperative female.  Mood anxiety.  Affect anxiety.  Thought processes logical, coherent and relevant.  Denies any active suicidal ideations.  No evidence of delusions.  No  hallucinations.  Cognition well-preserved.   ADMISSION DIAGNOSES:  AXIS I: Opiate dependence, major depressive  disorder.  AXIS II: No diagnosis.  AXIS III:  Chronic back pain, migraines.  AXIS IV: Moderate.  AXIS V:  Upon  admission 60, highest GAF in the last year 70.   COURSE IN THE HOSPITAL:  She was admitted.  She was started in  individual and group psychotherapy.  She was given some Librium on as as-  needed basis.  As already stated she is a 60 year old divorced white  female admitted secondary to asking help detoxing from opiates, abusing  her oxycodone.  Pain doctor refused to give her any more.  Entered the  [facility].  Was given Suboxone  apparently and got worse.  Admitted  to being depressed for a long time on and off Prozac.  More recently she is now placed on Celexa.  She was somatically focused,  dealing with a headache.  She did say that a week before this admission  she took a lot of Xanax and she was going through some withdrawal.  Did  not sleep, very confused, agitated, shaky.  Could not remember anything.  We helped her go through the withdrawal.  Her  mood was improved.  Affect  was brighter.  June 15, 2007 she was in full contact with reality.  There were no active suicidal or homicidal ideas, no hallucinations or  delusions.  Was going to see IOP.  Was wanting to go get discharged so  she could keep her job, worried about it.  Wanting to pursue further  work for her anxiety and her depression.   DISCHARGE DIAGNOSES:  AXIS I: Opiate dependence, major depressive  disorder, anxiety disorder not otherwise specified.  AXIS II: No diagnosis.  AXIS III:  Cervical spondylitis, radiculopathy, migraine headaches, back  pain.  AXIS IV: Moderate.  AXIS V: Upon discharge 60.   MEDICATIONS:  Discharged on:  1. Abilify 5 mg daily.  2. Celexa 40 mg per day.  3. BuSpar 15 one-half twice a day.  4. Lipitor 10 mg per day.  5. Ambien 10 at bedtime for sleep.   Follow up Elsie Stain at [facility].      Geoffery Lyons, M.D.  Electronically Signed     IL/MEDQ  D:  07/22/2007  T:  07/22/2007  Job:  [id number]

## 2011-01-23 ENCOUNTER — Emergency Department (HOSPITAL_COMMUNITY): Payer: Self-pay

## 2011-01-23 ENCOUNTER — Emergency Department (HOSPITAL_COMMUNITY)
Admission: EM | Admit: 2011-01-23 | Discharge: 2011-01-23 | Disposition: A | Discharge status: Home / Self Care | Payer: Self-pay | Attending: Emergency Medicine | Admitting: Emergency Medicine

## 2011-01-23 DIAGNOSIS — M21619 Bunion of unspecified foot: Secondary | ICD-10-CM | POA: Insufficient documentation

## 2011-01-23 DIAGNOSIS — M7750 Other enthesopathy of unspecified foot: Secondary | ICD-10-CM | POA: Insufficient documentation

## 2011-01-23 DIAGNOSIS — M79609 Pain in unspecified limb: Secondary | ICD-10-CM | POA: Insufficient documentation

## 2011-01-23 DIAGNOSIS — M2010 Hallux valgus (acquired), unspecified foot: Secondary | ICD-10-CM | POA: Insufficient documentation

## 2011-03-31 ENCOUNTER — Emergency Department (HOSPITAL_COMMUNITY)
Admission: EM | Admit: 2011-03-31 | Discharge: 2011-03-31 | Disposition: A | Discharge status: Home / Self Care | Payer: Self-pay | Attending: Emergency Medicine | Admitting: Emergency Medicine

## 2011-03-31 DIAGNOSIS — H53149 Visual discomfort, unspecified: Secondary | ICD-10-CM | POA: Insufficient documentation

## 2011-03-31 DIAGNOSIS — R51 Headache: Secondary | ICD-10-CM | POA: Insufficient documentation

## 2011-04-17 LAB — CBC
HCT: 43.8
MCV: 82.2
RBC: 5.32 — ABNORMAL HIGH
WBC: 6.3

## 2011-04-17 LAB — DIFFERENTIAL
Basophils Absolute: 0
Basophils Relative: 0
Eosinophils Absolute: 0
Eosinophils Relative: 1
Lymphocytes Relative: 17

## 2011-04-17 LAB — COMPREHENSIVE METABOLIC PANEL
AST: 19
Alkaline Phosphatase: 125 — ABNORMAL HIGH
CO2: 25
Chloride: 108
Creatinine, Ser: 0.74
GFR calc Af Amer: >60
GFR calc non Af Amer: >60
Potassium: 3.6
Total Bilirubin: 1

## 2011-04-17 LAB — D-DIMER, QUANTITATIVE: D-Dimer, Quant: <0.22

## 2011-04-17 LAB — POCT CARDIAC MARKERS: CKMB, poc: <1 — ABNORMAL LOW

## 2011-04-25 LAB — COMPREHENSIVE METABOLIC PANEL
ALT: 66 — ABNORMAL HIGH
AST: 36
CO2: 24
Calcium: 8.2 — ABNORMAL LOW
Chloride: 111
GFR calc Af Amer: >60
GFR calc non Af Amer: >60
Potassium: 3.6
Sodium: 140
Total Bilirubin: 1.2

## 2011-04-25 LAB — CBC
Hemoglobin: 14.6
MCHC: 33.6
MCV: 88.5
RBC: 4.5
RDW: 13.3
WBC: 6

## 2011-04-25 LAB — CARDIAC PANEL(CRET KIN+CKTOT+MB+TROPI)
Relative Index: INVALID
Relative Index: INVALID
Total CK: 43
Total CK: 50

## 2011-04-25 LAB — BASIC METABOLIC PANEL
CO2: 25
Calcium: 9.1
Chloride: 111
Creatinine, Ser: 0.59
Glucose, Bld: 94

## 2011-04-25 LAB — HEPATIC FUNCTION PANEL
Indirect Bilirubin: 0.9
Total Protein: 6.5

## 2011-04-25 LAB — GC/CHLAMYDIA PROBE AMP, GENITAL
Chlamydia, DNA Probe: NEGATIVE
GC Probe Amp, Genital: NEGATIVE

## 2011-04-25 LAB — DIFFERENTIAL
Basophils Absolute: 0.1
Basophils Relative: 1
Eosinophils Absolute: 0.2
Monocytes Absolute: 0.6
Monocytes Relative: 8
Neutro Abs: 4.7
Neutrophils Relative %: 62

## 2011-04-25 LAB — ACETAMINOPHEN LEVEL: Acetaminophen (Tylenol), Serum: <10 — ABNORMAL LOW

## 2011-04-25 LAB — URINALYSIS, ROUTINE W REFLEX MICROSCOPIC
Bilirubin Urine: NEGATIVE
Glucose, UA: NEGATIVE
Hgb urine dipstick: NEGATIVE
Specific Gravity, Urine: 1.022
pH: 6.5

## 2011-04-25 LAB — WET PREP, GENITAL
Clue Cells Wet Prep HPF POC: NONE SEEN
Trich, Wet Prep: NONE SEEN

## 2011-04-25 LAB — RPR: RPR Ser Ql: NONREACTIVE

## 2011-04-25 LAB — LIPASE, BLOOD: Lipase: 17

## 2011-05-06 LAB — URINALYSIS, ROUTINE W REFLEX MICROSCOPIC
Glucose, UA: NEGATIVE
Hgb urine dipstick: NEGATIVE
Leukocytes, UA: NEGATIVE
Nitrite: NEGATIVE
Protein, ur: 30 — AB
Specific Gravity, Urine: 1.037 — ABNORMAL HIGH
Urobilinogen, UA: 1
pH: 5.5

## 2011-05-06 LAB — COMPREHENSIVE METABOLIC PANEL WITH GFR
ALT: 17
AST: 18
Albumin: 4.1
Alkaline Phosphatase: 93
BUN: 4 — ABNORMAL LOW
CO2: 24
Calcium: 9.3
Chloride: 106
Creatinine, Ser: 0.62
GFR calc non Af Amer: >60
Glucose, Bld: 103 — ABNORMAL HIGH
Potassium: 3.5
Sodium: 138
Total Bilirubin: 1.2
Total Protein: 6.6

## 2011-05-06 LAB — CBC
HCT: 41.7
Hemoglobin: 14.5
MCHC: 34.7
MCV: 83.1
Platelets: 244
RBC: 5.02
RDW: 14.1
WBC: 5.8

## 2011-05-06 LAB — PREGNANCY, URINE: Preg Test, Ur: NEGATIVE

## 2011-05-06 LAB — URINE MICROSCOPIC-ADD ON

## 2011-05-06 LAB — TSH: TSH: 1.604

## 2011-05-06 LAB — BENZODIAZEPINE, QUANTITATIVE, URINE
Alprazolam (GC/LC/MS), ur confirm: 72 ng/mL
Flurazepam GC/MS Conf: NEGATIVE
Nordiazepam GC/MS Conf: 540 ng/mL
Oxazepam GC/MS Conf: NEGATIVE

## 2011-05-06 LAB — DRUGS OF ABUSE SCREEN W/O ALC, ROUTINE URINE
Amphetamine Screen, Ur: NEGATIVE
Barbiturate Quant, Ur: NEGATIVE
Benzodiazepines.: POSITIVE — AB
Cocaine Metabolites: NEGATIVE
Creatinine,U: 295.1
Marijuana Metabolite: NEGATIVE
Methadone: NEGATIVE
Opiate Screen, Urine: NEGATIVE
Phencyclidine (PCP): NEGATIVE
Propoxyphene: NEGATIVE

## 2011-05-12 LAB — BASIC METABOLIC PANEL
BUN: 11
CO2: 24
Calcium: 9.2
Chloride: 112
Creatinine, Ser: 0.55
GFR calc Af Amer: >60
GFR calc non Af Amer: >60
Glucose, Bld: 132 — ABNORMAL HIGH
Potassium: 3.8
Sodium: 142

## 2011-05-12 LAB — DIFFERENTIAL
Basophils Absolute: 0
Basophils Relative: 1
Eosinophils Absolute: 0
Eosinophils Relative: 0
Lymphocytes Relative: 17
Lymphs Abs: 1.2
Monocytes Absolute: 0.4
Monocytes Relative: 5
Neutro Abs: 5.6
Neutrophils Relative %: 78 — ABNORMAL HIGH

## 2011-05-12 LAB — CBC
HCT: 40.3
Hemoglobin: 13.8
MCHC: 34.1
MCV: 84.4
Platelets: 210
RBC: 4.78
RDW: 13.4
WBC: 7.2

## 2011-05-12 LAB — URINALYSIS, ROUTINE W REFLEX MICROSCOPIC
Bilirubin Urine: NEGATIVE
Glucose, UA: NEGATIVE
Hgb urine dipstick: NEGATIVE
Ketones, ur: NEGATIVE
Nitrite: NEGATIVE
Protein, ur: NEGATIVE
Specific Gravity, Urine: 1.023
Urobilinogen, UA: 0.2
pH: 5.5

## 2011-06-08 ENCOUNTER — Other Ambulatory Visit: Payer: Self-pay

## 2011-06-08 ENCOUNTER — Inpatient Hospital Stay (HOSPITAL_COMMUNITY)
Admission: EM | Admit: 2011-06-08 | Discharge: 2011-06-09 | DRG: 312 | Disposition: A | Discharge status: Home / Self Care | Payer: Self-pay | Attending: Internal Medicine | Admitting: Internal Medicine

## 2011-06-08 DIAGNOSIS — R0789 Other chest pain: Secondary | ICD-10-CM | POA: Diagnosis present

## 2011-06-08 DIAGNOSIS — M545 Low back pain, unspecified: Secondary | ICD-10-CM | POA: Diagnosis present

## 2011-06-08 DIAGNOSIS — R001 Bradycardia, unspecified: Secondary | ICD-10-CM | POA: Diagnosis present

## 2011-06-08 DIAGNOSIS — T465X5A Adverse effect of other antihypertensive drugs, initial encounter: Secondary | ICD-10-CM | POA: Diagnosis present

## 2011-06-08 DIAGNOSIS — I498 Other specified cardiac arrhythmias: Secondary | ICD-10-CM | POA: Diagnosis present

## 2011-06-08 DIAGNOSIS — Z880 Allergy status to penicillin: Secondary | ICD-10-CM

## 2011-06-08 DIAGNOSIS — Z79899 Other long term (current) drug therapy: Secondary | ICD-10-CM

## 2011-06-08 DIAGNOSIS — I959 Hypotension, unspecified: Secondary | ICD-10-CM | POA: Diagnosis present

## 2011-06-08 DIAGNOSIS — I9589 Other hypotension: Principal | ICD-10-CM | POA: Diagnosis present

## 2011-06-08 HISTORY — DX: Cardiac arrhythmia, unspecified: I49.9

## 2011-06-08 LAB — CBC
HCT: 39.4 % (ref 36.0–46.0)
Hemoglobin: 13.3 g/dL (ref 12.0–15.0)
MCHC: 33.8 g/dL (ref 30.0–36.0)
WBC: 5.2 10*3/uL (ref 4.0–10.5)

## 2011-06-08 LAB — COMPREHENSIVE METABOLIC PANEL
ALT: 6 U/L (ref 0–35)
Calcium: 9.9 mg/dL (ref 8.4–10.5)
GFR calc Af Amer: >90 mL/min (ref >90)
Glucose, Bld: 95 mg/dL (ref 70–99)
Sodium: 137 mEq/L (ref 135–145)
Total Protein: 6.3 g/dL (ref 6.0–8.3)

## 2011-06-08 MED ORDER — METOCLOPRAMIDE HCL 5 MG/ML IJ SOLN
10.0000 mg | Freq: Once | INTRAMUSCULAR | Status: AC
  Administered 2011-06-08: 10 mg via INTRAVENOUS
  Filled 2011-06-08: qty 2

## 2011-06-08 MED ORDER — SODIUM CHLORIDE 0.9 % IV SOLN
Freq: Once | INTRAVENOUS | Status: AC
  Administered 2011-06-08: 23:00:00 via INTRAVENOUS

## 2011-06-08 MED ORDER — SODIUM CHLORIDE 0.9 % IV BOLUS (SEPSIS)
1000.0000 mL | Freq: Once | INTRAVENOUS | Status: AC
  Administered 2011-06-08: 1000 mL via INTRAVENOUS

## 2011-06-08 MED ORDER — ASPIRIN 81 MG PO CHEW
324.0000 mg | CHEWABLE_TABLET | Freq: Once | ORAL | Status: AC
  Administered 2011-06-08: 324 mg via ORAL
  Filled 2011-06-08: qty 3
  Filled 2011-06-08: qty 1

## 2011-06-08 NOTE — ED Notes (Signed)
Pt aware of admission to hospital.  Pt without complaint

## 2011-06-08 NOTE — H&P (Signed)
Dorothy White is an 60 y.o. female.   Chief Complaint: low blood pressure and chronic chest pain HPI: the pt came to the ED today because she checked her blood pressure at home and it was found to be low.  She has been trying to get off Oxycodone that she has been taking on a regular basis and her doctor gave her a Rx for clonidine to help with possible withdrawals.  She says that she knew that her BP can get low with that medication along with her Metoprolol and therefore she stopped taking Metoprolol 4 days ago but continued taking clonidine.  C/O headache and chest pain which she says she has been having for the past few months off and on.  She has never been evaluated for CAD and says that her CP is not related to movement or food and comes off and on unrelated to anything.  She also says that her pulse has been in the 50's   Past Medical History  Diagnosis Date  . Arrhythmia     Past Surgical History  Procedure Date  . Tonsillectomy   . Abdominal hysterectomy     No family history on file. Social History:  reports that she has been smoking.  She does not have any smokeless tobacco history on file. She reports that she does not drink alcohol or use illicit drugs.  Allergies:  Allergies  Allergen Reactions  . Penicillins     REACTION: hives    Medications Prior to Admission  Medication Dose Route Frequency Provider Last Rate Last Dose  . aspirin chewable tablet 324 mg  324 mg Oral Once Doug Sou, MD   324 mg at 06/08/11 1815  . metoCLOPramide (REGLAN) injection 10 mg  10 mg Intravenous Once Doug Sou, MD   10 mg at 06/08/11 1911  . sodium chloride 0.9 % bolus 1,000 mL  1,000 mL Intravenous Once Doug Sou, MD   1,000 mL at 06/08/11 1906   No current outpatient prescriptions on file as of 06/08/2011.    Results for orders placed during the hospital encounter of 06/08/11 (from the past 48 hour(s))  TROPONIN I     Status: Normal   Collection Time   06/08/11   7:00 PM      Component Value Range Comment   Troponin I <0.30  <0.30 (ng/mL)   COMPREHENSIVE METABOLIC PANEL     Status: Normal   Collection Time   06/08/11  7:00 PM      Component Value Range Comment   Sodium 137  135 - 145 (mEq/L)    Potassium 4.0  3.5 - 5.1 (mEq/L)    Chloride 104  96 - 112 (mEq/L)    CO2 23  19 - 32 (mEq/L)    Glucose, Bld 95  70 - 99 (mg/dL)    BUN 10  6 - 23 (mg/dL)    Creatinine, Ser 6.64  0.50 - 1.10 (mg/dL)    Calcium 9.9  8.4 - 10.5 (mg/dL)    Total Protein 6.3  6.0 - 8.3 (g/dL)    Albumin 3.6  3.5 - 5.2 (g/dL)    AST 9  0 - 37 (U/L)    ALT 6  0 - 35 (U/L)    Alkaline Phosphatase 74  39 - 117 (U/L)    Total Bilirubin 0.5  0.3 - 1.2 (mg/dL)    GFR calc non Af Amer >90  >90 (mL/min)    GFR calc Af Amer >90  >90 (  mL/min)   CBC     Status: Normal   Collection Time   06/08/11  7:00 PM      Component Value Range Comment   WBC 5.2  4.0 - 10.5 (K/uL)    RBC 4.47  3.87 - 5.11 (MIL/uL)    Hemoglobin 13.3  12.0 - 15.0 (g/dL)    HCT 30.8  65.7 - 84.6 (%)    MCV 88.1  78.0 - 100.0 (fL)    MCH 29.8  26.0 - 34.0 (pg)    MCHC 33.8  30.0 - 36.0 (g/dL)    RDW 96.2  95.2 - 84.1 (%)    Platelets 181  150 - 400 (K/uL)    No results found.  Review of Systems  Constitutional: Positive for malaise/fatigue. Negative for fever, chills, weight loss and diaphoresis.  HENT: Negative for hearing loss, ear pain, neck pain and tinnitus.   Eyes: Negative for blurred vision, double vision and photophobia.  Respiratory: Negative for cough, hemoptysis and shortness of breath.   Cardiovascular: Positive for chest pain. Negative for palpitations, orthopnea, claudication, leg swelling and PND.  Gastrointestinal: Negative for heartburn and nausea.  Musculoskeletal: Negative for myalgias.  Skin: Negative for itching and rash.  Neurological: Positive for weakness and headaches. Negative for dizziness.  Psychiatric/Behavioral: Negative.     Blood pressure 116/54, pulse 46,  temperature 98 F (36.7 C), temperature source Oral, resp. rate 16, SpO2 99.00%. Physical Exam  Constitutional: She is oriented to person, place, and time. She appears well-developed and well-nourished.  HENT:  Head: Normocephalic and atraumatic.  Eyes: Conjunctivae and EOM are normal. Pupils are equal, round, and reactive to light.  Neck: Normal range of motion. Neck supple. No JVD present. No thyromegaly present.  Cardiovascular: Normal rate, regular rhythm and normal heart sounds.  Exam reveals no gallop and no friction rub.   No murmur heard. Respiratory: Effort normal and breath sounds normal. No respiratory distress. She has no wheezes. She has no rales. She exhibits no tenderness.  GI: Bowel sounds are normal. She exhibits no distension. There is no tenderness.  Musculoskeletal: Normal range of motion. She exhibits no edema and no tenderness.  Lymphadenopathy:    She has no cervical adenopathy.  Neurological: She is alert and oriented to person, place, and time. No cranial nerve deficit.  Skin: Skin is warm and dry. No rash noted. No erythema. No pallor.  Psychiatric: She has a normal mood and affect.     Assessment/Plan 1. Hypotension- the pt will be taken off clonidine and metoprolol along with klonopin to avoid any more hypotension and bradycardia.  Will hydrate with IVF at 125 cc/hour 2.  Bradycardia- discontinue both clonidine and metoprolol since a combination of both may cause profound bradycardia 3.  Chest pain- may be due to hypotension and bradycardia, will check another troponin in am.  At this time she does not need a stress test but will probably need one as an outpt.  Melene Plan 06/08/2011, 9:27 PM

## 2011-06-08 NOTE — ED Provider Notes (Signed)
History     CSN: [id number] Arrival date & time: 06/08/2011  4:37 PM   First MD Initiated Contact with Patient 06/08/11 1741      Chief Complaint  Patient presents with  . Weakness    pt in from home with states low blood pressure states decreased appetite, dizziness states has been checking on and off with low readings denies pain    (Consider location/radiation/quality/duration/timing/severity/associated sxs/prior treatment) HPI Complains of generalized weakness onset 4 days ago. Patient also reports her mid chest pain for the past 4 days, no appetite denies abdominal pain no fever. Also complains of left-sided temporal headache for 2 days typical of migraine she's had in the past. Patient noted her blood pressure below today at home. She was recently started on clonidine to prevent narcotics withdrawal. Patient had been on OxyContin for a prolonged period due to chronic pain. Last use clonidine yesterday Past Medical History  Diagnosis Date  . Arrhythmia     Past Surgical History  Procedure Date  . Tonsillectomy   . Abdominal hysterectomy     No family history on file. Family history Coronary artery disease at young age in father History  Substance Use Topics  . Smoking status: Current Everyday Smoker  . Smokeless tobacco: Not on file  . Alcohol Use: No    OB History    Grav Para Term Preterm Abortions TAB SAB Ect Mult Living                  Review of Systems  Constitutional: Positive for appetite change.       Gen. weakness  Cardiovascular: Positive for chest pain.  Neurological: Positive for headaches.    Allergies  Penicillins  Home Medications   Current Outpatient Rx  Name Route Sig Dispense Refill  . CLONIDINE HCL 0.1 MG PO TABS Oral Take 0.1 mg by mouth 2 (two) times daily.      Marland Kitchen METOPROLOL TARTRATE 25 MG PO TABS Oral Take 25 mg by mouth 2 (two) times daily.        BP 100/61  Pulse 52  Temp(Src) 98 F (36.7 C) (Oral)  Resp 18  SpO2  100%  Physical Exam  Nursing note and vitals reviewed. Constitutional: She appears well-developed and well-nourished.  HENT:  Head: Normocephalic and atraumatic.  Eyes: Conjunctivae are normal. Pupils are equal, round, and reactive to light.  Neck: Neck supple. No tracheal deviation present. No thyromegaly present.  Cardiovascular: Regular rhythm.   No murmur heard.      Bradycardic  Pulmonary/Chest: Effort normal and breath sounds normal.  Abdominal: Soft. Bowel sounds are normal. She exhibits no distension. There is no tenderness.  Musculoskeletal: Normal range of motion. She exhibits no edema and no tenderness.  Neurological: She is alert. Coordination normal.       Normal gait. Becomes lightheaded on standing  Skin: Skin is warm and dry. No rash noted.  Psychiatric: She has a normal mood and affect.   Date: 06/08/2011  Rate: 51  Rhythm: sinus bradycardia  QRS Axis: normal  Intervals: normal  ST/T Wave abnormalities: normal  Conduction Disutrbances:none  Narrative Interpretation:   Old EKG Reviewed: changes noted  Normalization of anterior T waves from previous tracing of 02/27/2008 ED Course  Procedures (including critical care time)  Labs Reviewed - No data to display No results found.  Results for orders placed during the hospital encounter of 06/08/11  TROPONIN I      Component Value Range   Troponin  I <0.30  <0.30 (ng/mL)  COMPREHENSIVE METABOLIC PANEL      Component Value Range   Sodium 137  135 - 145 (mEq/L)   Potassium 4.0  3.5 - 5.1 (mEq/L)   Chloride 104  96 - 112 (mEq/L)   CO2 23  19 - 32 (mEq/L)   Glucose, Bld 95  70 - 99 (mg/dL)   BUN 10  6 - 23 (mg/dL)   Creatinine, Ser 1.61  0.50 - 1.10 (mg/dL)   Calcium 9.9  8.4 - 09.6 (mg/dL)   Total Protein 6.3  6.0 - 8.3 (g/dL)   Albumin 3.6  3.5 - 5.2 (g/dL)   AST 9  0 - 37 (U/L)   ALT 6  0 - 35 (U/L)   Alkaline Phosphatase 74  39 - 117 (U/L)   Total Bilirubin 0.5  0.3 - 1.2 (mg/dL)   GFR calc non Af Amer  >90  >90 (mL/min)   GFR calc Af Amer >90  >90 (mL/min)  CBC      Component Value Range   WBC 5.2  4.0 - 10.5 (K/uL)   RBC 4.47  3.87 - 5.11 (MIL/uL)   Hemoglobin 13.3  12.0 - 15.0 (g/dL)   HCT 04.5  40.9 - 81.1 (%)   MCV 88.1  78.0 - 100.0 (fL)   MCH 29.8  26.0 - 34.0 (pg)   MCHC 33.8  30.0 - 36.0 (g/dL)   RDW 91.4  78.2 - 95.6 (%)   Platelets 181  150 - 400 (K/uL)   No diagnosis found.  9:20 PM resting comfortably. Spoke with hospitalist M.D. who will admit patient  MDM  Hypotension likely secondary to clonidine together with beta blocker Diagnosis #1 hypotension #2 chest pain #3 migraine headache        Doug Sou, MD 06/08/11 2129

## 2011-06-08 NOTE — ED Notes (Signed)
Patient denies pain and is resting comfortably.  

## 2011-06-08 NOTE — ED Notes (Signed)
Pt reports wanting to detox from pain pills PCP placed her on clonidine for opiate withdraw- she started on Wednesday pt has not taken her heart med since last Monday- starting feeling bad on Wednesday.  generalized weakness.  Pt at present denies chest pain and SOB.  Pt placed on c monitor and pulse ox for support

## 2011-06-08 NOTE — ED Notes (Signed)
Documentation error- pt does not have g tube wrong pt

## 2011-06-08 NOTE — ED Notes (Signed)
Patient is resting comfortably. 

## 2011-06-09 ENCOUNTER — Other Ambulatory Visit: Payer: Self-pay

## 2011-06-09 ENCOUNTER — Encounter (HOSPITAL_COMMUNITY): Payer: Self-pay | Admitting: *Deleted

## 2011-06-09 DIAGNOSIS — R0789 Other chest pain: Secondary | ICD-10-CM | POA: Diagnosis present

## 2011-06-09 DIAGNOSIS — I959 Hypotension, unspecified: Secondary | ICD-10-CM | POA: Diagnosis present

## 2011-06-09 DIAGNOSIS — R001 Bradycardia, unspecified: Secondary | ICD-10-CM | POA: Diagnosis present

## 2011-06-09 LAB — COMPREHENSIVE METABOLIC PANEL
ALT: 6 U/L (ref 0–35)
Alkaline Phosphatase: 67 U/L (ref 39–117)
CO2: 25 mEq/L (ref 19–32)
Chloride: 108 mEq/L (ref 96–112)
GFR calc Af Amer: >90 mL/min (ref >90)
GFR calc non Af Amer: >90 mL/min (ref >90)
Glucose, Bld: 85 mg/dL (ref 70–99)
Potassium: 3.9 mEq/L (ref 3.5–5.1)
Sodium: 140 mEq/L (ref 135–145)

## 2011-06-09 LAB — CBC
MCH: 29.4 pg (ref 26.0–34.0)
MCHC: 32.7 g/dL (ref 30.0–36.0)
MCV: 89.8 fL (ref 78.0–100.0)
Platelets: 170 10*3/uL (ref 150–400)
RDW: 13.7 % (ref 11.5–15.5)

## 2011-06-09 MED ORDER — FLUOXETINE HCL 20 MG PO CAPS
60.0000 mg | ORAL_CAPSULE | Freq: Every day | ORAL | Status: DC
  Administered 2011-06-09: 60 mg via ORAL
  Filled 2011-06-09 (×2): qty 3

## 2011-06-09 MED ORDER — ACETAMINOPHEN 325 MG PO TABS: 650.0000 mg | ORAL_TABLET | Freq: Four times a day (QID) | ORAL | Status: AC | PRN

## 2011-06-09 MED ORDER — OXYCODONE HCL 5 MG PO TABS
5.0000 mg | ORAL_TABLET | ORAL | Status: DC | PRN
  Administered 2011-06-09: 5 mg via ORAL
  Filled 2011-06-09: qty 1

## 2011-06-09 MED ORDER — OXYCODONE HCL 5 MG PO TABS
5.0000 mg | ORAL_TABLET | Freq: Once | ORAL | Status: AC
  Administered 2011-06-09: 5 mg via ORAL
  Filled 2011-06-09: qty 1

## 2011-06-09 MED ORDER — ALUM & MAG HYDROXIDE-SIMETH 200-200-20 MG/5ML PO SUSP: 30.0000 mL | Freq: Four times a day (QID) | ORAL | Status: DC | PRN

## 2011-06-09 MED ORDER — OXYCODONE HCL 5 MG PO TABS
10.0000 mg | ORAL_TABLET | ORAL | Status: DC | PRN
  Administered 2011-06-09: 10 mg via ORAL
  Filled 2011-06-09 (×2): qty 1

## 2011-06-09 MED ORDER — OXYCODONE HCL 10 MG PO TABS: 10.0000 mg | ORAL_TABLET | ORAL | Status: AC | PRN

## 2011-06-09 MED ORDER — ZOLPIDEM TARTRATE 5 MG PO TABS: 5.0000 mg | ORAL_TABLET | Freq: Every evening | ORAL | Status: DC | PRN

## 2011-06-09 MED ORDER — ACETAMINOPHEN 650 MG RE SUPP: 650.0000 mg | Freq: Four times a day (QID) | RECTAL | Status: DC | PRN

## 2011-06-09 MED ORDER — SENNA 8.6 MG PO TABS: 2.0000 | ORAL_TABLET | Freq: Every day | ORAL | Status: DC | PRN

## 2011-06-09 MED ORDER — SODIUM CHLORIDE 0.9 % IV SOLN
INTRAVENOUS | Status: DC
  Administered 2011-06-09: 08:00:00 via INTRAVENOUS

## 2011-06-09 MED ORDER — ONDANSETRON HCL 4 MG/2ML IJ SOLN: 4.0000 mg | Freq: Four times a day (QID) | INTRAMUSCULAR | Status: DC | PRN

## 2011-06-09 MED ORDER — ACETAMINOPHEN 325 MG PO TABS: 650.0000 mg | ORAL_TABLET | Freq: Four times a day (QID) | ORAL | Status: DC | PRN

## 2011-06-09 MED ORDER — ONDANSETRON HCL 4 MG PO TABS: 4.0000 mg | ORAL_TABLET | Freq: Four times a day (QID) | ORAL | Status: DC | PRN

## 2011-06-09 MED ORDER — ENOXAPARIN SODIUM 40 MG/0.4ML ~~LOC~~ SOLN
40.0000 mg | SUBCUTANEOUS | Status: DC
  Administered 2011-06-09: 40 mg via SUBCUTANEOUS
  Filled 2011-06-09: qty 0.4

## 2011-06-09 NOTE — ED Notes (Signed)
Patient denies pain and is resting comfortably.  

## 2011-06-09 NOTE — Progress Notes (Signed)
PLEASE FOLLOW UP AT [facility] Aug 11, 2011 AT 2:15 WITH DR Clelia Croft AND ELIGIBILITY APT AT [facility] Jul 31, 2010 AT 10:45AM

## 2011-06-09 NOTE — ED Notes (Signed)
Vital signs stable. 
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
## 2011-06-09 NOTE — Progress Notes (Signed)
TALKED TO PATIENT ABOUT DCP; PATIENT IS ACTIVE WITH [facility]; FOLLOW UP APT MADE WITH DR Clelia Croft WITH [facility] FOR Aug 11, 2011 AT 2:15 AND ELIGIBILITY APT WITH [facility] FOR Aug 01, 2011 AT 10:45; INFORMATION GIVEN TO PATIENT; B. Kartel Wolbert RN, BSN, MHA

## 2011-06-09 NOTE — Progress Notes (Signed)
Subjective: No complain. Wants to go home Objective: Filed Vitals:   06/09/11 0137 06/09/11 0240 06/09/11 0244 06/09/11 0300  BP: 116/50 119/50  139/71  Pulse:  48  56  Temp:   98 F (36.7 C) 98.6 F (37 C)  TempSrc:   Oral Oral  Resp: 15 13  18   Height:    5\' 5"  (1.651 m)  Weight:    60.4 kg (133 lb 2.5 oz)  SpO2: 100% 99%  98%   Weight change:   Intake/Output Summary (Last 24 hours) at 06/09/11 1057 Last data filed at 06/09/11 1016  Gross per 24 hour  Intake  677.5 ml  Output    650 ml  Net   27.5 ml    General: Alert, awake, oriented x3, in no acute distress.  HEENT: No bruits, no goiter.  Heart: Regular rate and rhythm, without murmurs, rubs, gallops.  Lungs: Crackles left side, bilateral air movement.  Abdomen: Soft, nontender, nondistended, positive bowel sounds.  Neuro: Grossly intact, nonfocal.   Lab Results:  [facility] 06/09/11 0606 06/08/11 1900  NA 140 137  K 3.9 4.0  CL 108 104  CO2 25 23  GLUCOSE 85 95  BUN 8 10  CREATININE 0.64 0.67  CALCIUM 8.8 9.9  MG -- --  PHOS -- --    Basename 06/09/11 0606 06/08/11 1900  AST 9 9  ALT 6 6  ALKPHOS 67 74  BILITOT 0.7 0.5  PROT 5.6* 6.3  ALBUMIN 3.2* 3.6   No results found for this basename: LIPASE:2,AMYLASE:2 in the last 72 hours  Basename 06/09/11 0606 06/08/11 1900  WBC 4.6 5.2  NEUTROABS -- --  HGB 12.1 13.3  HCT 37.0 39.4  MCV 89.8 88.1  PLT 170 181    Basename 06/09/11 0606 06/08/11 1900  CKTOTAL -- --  CKMB -- --  CKMBINDEX -- --  TROPONINI <0.30 <0.30   No results found for this basename: POCBNP:3 in the last 72 hours No results found for this basename: DDIMER:2 in the last 72 hours No results found for this basename: HGBA1C:2 in the last 72 hours No results found for this basename: CHOL:2,HDL:2,LDLCALC:2,TRIG:2,CHOLHDL:2,LDLDIRECT:2 in the last 72 hours No results found for this basename: TSH,T4TOTAL,FREET3,T3FREE,THYROIDAB in the last 72 hours No results found for this basename:  VITAMINB12:2,FOLATE:2,FERRITIN:2,TIBC:2,IRON:2,RETICCTPCT:2 in the last 72 hours  Micro Results: No results found for this or any previous visit (from the past 240 hour(s)).  Studies/Results: No results found.  Medications: I have reviewed the patient's current medications.   Patient Active Hospital Problem List: Hypotension arterial (06/09/2011)  resolved secondary to clonidine.i have DC this also oriented her to stop taking this at home.   Bradycardia (06/09/2011)  resolved patient asymptomatic. She is on beta blockers at home   Chest pain (06/09/2011) resolved secondary to #1    LOS: 1 day   Marinda Elk Pager: [phone] [facility] 06/09/2011, 10:57 AM

## 2011-06-09 NOTE — ED Notes (Signed)
Patient is resting comfortably. 

## 2011-06-09 NOTE — ED Notes (Signed)
Pt made aware of room assignment 1418.  Belonging bag x 2 with pt. Joselyn Glassman RN assisted with transport
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
## 2011-06-21 ENCOUNTER — Encounter (HOSPITAL_COMMUNITY): Payer: Self-pay | Admitting: *Deleted

## 2011-06-21 ENCOUNTER — Other Ambulatory Visit: Payer: Self-pay

## 2011-06-21 ENCOUNTER — Emergency Department (HOSPITAL_COMMUNITY)
Admission: EM | Admit: 2011-06-21 | Discharge: 2011-06-21 | Disposition: A | Discharge status: Home / Self Care | Payer: Self-pay | Attending: Emergency Medicine | Admitting: Emergency Medicine

## 2011-06-21 DIAGNOSIS — I498 Other specified cardiac arrhythmias: Secondary | ICD-10-CM | POA: Insufficient documentation

## 2011-06-21 DIAGNOSIS — R001 Bradycardia, unspecified: Secondary | ICD-10-CM

## 2011-06-21 DIAGNOSIS — R51 Headache: Secondary | ICD-10-CM | POA: Insufficient documentation

## 2011-06-21 DIAGNOSIS — F17200 Nicotine dependence, unspecified, uncomplicated: Secondary | ICD-10-CM | POA: Insufficient documentation

## 2011-06-21 LAB — URINALYSIS, ROUTINE W REFLEX MICROSCOPIC
Bilirubin Urine: NEGATIVE
Hgb urine dipstick: NEGATIVE
Ketones, ur: NEGATIVE mg/dL
Protein, ur: NEGATIVE mg/dL
Urobilinogen, UA: 1 mg/dL (ref 0.0–1.0)

## 2011-06-21 LAB — SEDIMENTATION RATE: Sed Rate: 2 mm/hr (ref 0–22)

## 2011-06-21 LAB — RAPID URINE DRUG SCREEN, HOSP PERFORMED
Amphetamines: NOT DETECTED
Barbiturates: NOT DETECTED
Benzodiazepines: POSITIVE — AB
Cocaine: NOT DETECTED
Opiates: NOT DETECTED
Tetrahydrocannabinol: NOT DETECTED

## 2011-06-21 MED ORDER — SODIUM CHLORIDE 0.9 % IV SOLN
INTRAVENOUS | Status: DC
  Administered 2011-06-21: 11:00:00 via INTRAVENOUS

## 2011-06-21 MED ORDER — METOCLOPRAMIDE HCL 5 MG/ML IJ SOLN
10.0000 mg | Freq: Once | INTRAMUSCULAR | Status: AC
  Administered 2011-06-21: 10 mg via INTRAVENOUS
  Filled 2011-06-21: qty 2

## 2011-06-21 MED ORDER — METOPROLOL TARTRATE 12.5 MG HALF TABLET: ORAL_TABLET | ORAL | Status: DC

## 2011-06-21 MED ORDER — DIPHENHYDRAMINE HCL 50 MG/ML IJ SOLN
50.0000 mg | Freq: Once | INTRAMUSCULAR | Status: AC
  Administered 2011-06-21: 50 mg via INTRAVENOUS
  Filled 2011-06-21: qty 1

## 2011-06-21 MED ORDER — SODIUM CHLORIDE 0.9 % IV BOLUS (SEPSIS)
1000.0000 mL | Freq: Once | INTRAVENOUS | Status: AC
  Administered 2011-06-21: 1000 mL via INTRAVENOUS

## 2011-06-21 MED ORDER — ONDANSETRON HCL 4 MG/2ML IJ SOLN
4.0000 mg | Freq: Once | INTRAMUSCULAR | Status: AC
  Administered 2011-06-21: 4 mg via INTRAVENOUS
  Filled 2011-06-21: qty 2

## 2011-06-21 MED ORDER — KETOROLAC TROMETHAMINE 30 MG/ML IJ SOLN
30.0000 mg | Freq: Once | INTRAMUSCULAR | Status: AC
  Administered 2011-06-21: 30 mg via INTRAVENOUS
  Filled 2011-06-21: qty 1
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
## 2011-06-21 NOTE — ED Notes (Signed)
B/P 127/72  HR  54---Rates her pain a ten---Lights down to increase comfort--Meds given as ordered.

## 2011-06-21 NOTE — ED Notes (Signed)
Pt. Not in exam room to sign discharge papers.  She seemed displease with meds she was given and reported minimal relief to all modalities of trt.

## 2011-06-21 NOTE — ED Notes (Signed)
Denies any relief of pain
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
## 2011-06-21 NOTE — ED Notes (Signed)
Rates headache pain and back pain a 10 on 1-10 scale

## 2011-07-21 ENCOUNTER — Emergency Department (HOSPITAL_COMMUNITY): Payer: Self-pay

## 2011-07-21 ENCOUNTER — Encounter (HOSPITAL_COMMUNITY): Payer: Self-pay

## 2011-07-21 ENCOUNTER — Emergency Department (HOSPITAL_COMMUNITY)
Admission: EM | Admit: 2011-07-21 | Discharge: 2011-07-21 | Disposition: A | Discharge status: Home / Self Care | Payer: Self-pay | Attending: Emergency Medicine | Admitting: Emergency Medicine

## 2011-07-21 DIAGNOSIS — R10814 Left lower quadrant abdominal tenderness: Secondary | ICD-10-CM | POA: Insufficient documentation

## 2011-07-21 DIAGNOSIS — K5730 Diverticulosis of large intestine without perforation or abscess without bleeding: Secondary | ICD-10-CM | POA: Insufficient documentation

## 2011-07-21 DIAGNOSIS — Z90710 Acquired absence of both cervix and uterus: Secondary | ICD-10-CM | POA: Insufficient documentation

## 2011-07-21 DIAGNOSIS — R109 Unspecified abdominal pain: Secondary | ICD-10-CM | POA: Insufficient documentation

## 2011-07-21 HISTORY — DX: Diverticulitis of intestine, part unspecified, without perforation or abscess without bleeding: K57.92

## 2011-07-21 LAB — COMPREHENSIVE METABOLIC PANEL
BUN: 11 mg/dL (ref 6–23)
CO2: 26 mEq/L (ref 19–32)
Calcium: 9.6 mg/dL (ref 8.4–10.5)
Creatinine, Ser: 0.61 mg/dL (ref 0.50–1.10)
GFR calc Af Amer: >90 mL/min (ref >90)
GFR calc non Af Amer: >90 mL/min (ref >90)
Glucose, Bld: 78 mg/dL (ref 70–99)

## 2011-07-21 LAB — CBC
HCT: 41.2 % (ref 36.0–46.0)
MCV: 90.2 fL (ref 78.0–100.0)
RBC: 4.57 MIL/uL (ref 3.87–5.11)
WBC: 5.7 10*3/uL (ref 4.0–10.5)

## 2011-07-21 LAB — DIFFERENTIAL
Eosinophils Relative: 2 % (ref 0–5)
Lymphocytes Relative: 33 % (ref 12–46)
Lymphs Abs: 1.9 10*3/uL (ref 0.7–4.0)
Monocytes Absolute: 0.5 10*3/uL (ref 0.1–1.0)

## 2011-07-21 LAB — URINE MICROSCOPIC-ADD ON

## 2011-07-21 LAB — URINALYSIS, ROUTINE W REFLEX MICROSCOPIC
Bilirubin Urine: NEGATIVE
Leukocytes, UA: NEGATIVE
Nitrite: NEGATIVE
Specific Gravity, Urine: 1.028 (ref 1.005–1.030)
Urobilinogen, UA: 0.2 mg/dL (ref 0.0–1.0)

## 2011-07-21 MED ORDER — HYDROMORPHONE HCL PF 1 MG/ML IJ SOLN
0.5000 mg | Freq: Once | INTRAMUSCULAR | Status: AC
  Administered 2011-07-21: 0.5 mg via INTRAVENOUS
  Filled 2011-07-21: qty 1

## 2011-07-21 MED ORDER — LACTATED RINGERS IV SOLN
INTRAVENOUS | Status: DC
  Administered 2011-07-21: 17:00:00 via INTRAVENOUS

## 2011-07-21 MED ORDER — IOHEXOL 300 MG/ML  SOLN
80.0000 mL | Freq: Once | INTRAMUSCULAR | Status: AC | PRN
  Administered 2011-07-21: 80 mL via INTRAVENOUS

## 2011-07-21 MED ORDER — METOCLOPRAMIDE HCL 5 MG/ML IJ SOLN
10.0000 mg | Freq: Once | INTRAMUSCULAR | Status: AC
  Administered 2011-07-21: 10 mg via INTRAVENOUS
  Filled 2011-07-21: qty 2
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
## 2011-07-21 NOTE — ED Notes (Signed)
Pt reports LLQ abdominal pain x2 weeks. Denies N/V/D. LBM this morning and a "little constipated." NAD noted.

## 2011-10-21 ENCOUNTER — Encounter (HOSPITAL_COMMUNITY): Payer: Self-pay | Admitting: Emergency Medicine

## 2011-10-21 ENCOUNTER — Emergency Department (HOSPITAL_COMMUNITY)
Admission: EM | Admit: 2011-10-21 | Discharge: 2011-10-21 | Disposition: A | Discharge status: Home / Self Care | Payer: Self-pay | Attending: Emergency Medicine | Admitting: Emergency Medicine

## 2011-10-21 ENCOUNTER — Emergency Department (HOSPITAL_COMMUNITY): Payer: Self-pay

## 2011-10-21 DIAGNOSIS — Z79899 Other long term (current) drug therapy: Secondary | ICD-10-CM | POA: Insufficient documentation

## 2011-10-21 DIAGNOSIS — K029 Dental caries, unspecified: Secondary | ICD-10-CM | POA: Insufficient documentation

## 2011-10-21 DIAGNOSIS — Y93B1 Activity, exercise machines primarily for muscle strengthening: Secondary | ICD-10-CM | POA: Insufficient documentation

## 2011-10-21 DIAGNOSIS — M545 Low back pain, unspecified: Secondary | ICD-10-CM | POA: Insufficient documentation

## 2011-10-21 DIAGNOSIS — W010XXA Fall on same level from slipping, tripping and stumbling without subsequent striking against object, initial encounter: Secondary | ICD-10-CM | POA: Insufficient documentation

## 2011-10-21 DIAGNOSIS — S025XXA Fracture of tooth (traumatic), initial encounter for closed fracture: Secondary | ICD-10-CM | POA: Insufficient documentation

## 2011-10-21 DIAGNOSIS — K0889 Other specified disorders of teeth and supporting structures: Secondary | ICD-10-CM

## 2011-10-21 DIAGNOSIS — M25569 Pain in unspecified knee: Secondary | ICD-10-CM | POA: Insufficient documentation

## 2011-10-21 DIAGNOSIS — K089 Disorder of teeth and supporting structures, unspecified: Secondary | ICD-10-CM | POA: Insufficient documentation

## 2011-10-21 DIAGNOSIS — M79609 Pain in unspecified limb: Secondary | ICD-10-CM | POA: Insufficient documentation

## 2011-10-21 MED ORDER — CLINDAMYCIN HCL 300 MG PO CAPS: 300.0000 mg | ORAL_CAPSULE | Freq: Four times a day (QID) | ORAL | Status: AC

## 2011-10-21 MED ORDER — HYDROCODONE-ACETAMINOPHEN 5-325 MG PO TABS: 1.0000 | ORAL_TABLET | ORAL | Status: AC | PRN

## 2011-10-21 NOTE — ED Notes (Signed)
Pt fell while on treadmill on Sunday night and fell on her left side. Pt c/o lower back pain that does radiate some. She also reports chronic sciatica pain but lower back pain is worse today after falling and pain rated at 8/10.
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
## 2011-10-21 NOTE — Discharge Instructions (Signed)
Review information below is followup with your dentist or the dentist listed above.  And please followup with your doctor, who follows, you for your low back pain.  Please take the medication as prescribed.  If you have difficulty swallowing or breathing, inability to tolerate fluids by mouth, high fevers, loss of control of bowel or bladder, weakness or numbness of your leg or inability to use your leg, return to the emergency department immediately. You may return to the ER at any time for worsening condition or any new symptoms that concern you.  Back Exercises Back exercises help treat and prevent back injuries. The goal of back exercises is to increase the strength of your abdominal and back muscles and the flexibility of your back. These exercises should be started when you no longer have back pain. Back exercises include:  Pelvic Tilt. Lie on your back with your knees bent. Tilt your pelvis until the lower part of your back is against the floor. Hold this position 5 to 10 sec and repeat 5 to 10 times.   Knee to Chest. Pull first 1 knee up against your chest and hold for 20 to 30 seconds, repeat this with the other knee, and then both knees. This may be done with the other leg straight or bent, whichever feels better.   Sit-Ups or Curl-Ups. Bend your knees 90 degrees. Start with tilting your pelvis, and do a partial, slow sit-up, lifting your trunk only 30 to 45 degrees off the floor. Take at least 2 to 3 seconds for each sit-up. Do not do sit-ups with your knees out straight. If partial sit-ups are difficult, simply do the above but with only tightening your abdominal muscles and holding it as directed.   Hip-Lift. Lie on your back with your knees flexed 90 degrees. Push down with your feet and shoulders as you raise your hips a couple inches off the floor; hold for 10 seconds, repeat 5 to 10 times.   Back arches. Lie on your stomach, propping yourself up on bent elbows. Slowly press on your  hands, causing an arch in your low back. Repeat 3 to 5 times. Any initial stiffness and discomfort should lessen with repetition over time.   Shoulder-Lifts. Lie face down with arms beside your body. Keep hips and torso pressed to floor as you slowly lift your head and shoulders off the floor.  Do not overdo your exercises, especially in the beginning. Exercises may cause you some mild back discomfort which lasts for a few minutes; however, if the pain is more severe, or lasts for more than 15 minutes, do not continue exercises until you see your caregiver. Improvement with exercise therapy for back problems is slow.  See your caregivers for assistance with developing a proper back exercise program. Document Released: 08/21/2004 Document Revised: 07/03/2011 Document Reviewed: 07/14/2005 [facility] Patient Information 2012 [place], [place]  .Back Pain, Adult Low back pain is very common. About 1 in 5 people have back pain.The cause of low back pain is rarely dangerous. The pain often gets better over time.About half of people with a sudden onset of back pain feel better in just 2 weeks. About 8 in 10 people feel better by 6 weeks.  CAUSES Some common causes of back pain include:  Strain of the muscles or ligaments supporting the spine.   Wear and tear (degeneration) of the spinal discs.   Arthritis.   Direct injury to the back.  DIAGNOSIS Most of the time, the direct cause  of low back pain is not known.However, back pain can be treated effectively even when the exact cause of the pain is unknown.Answering your caregiver's questions about your overall health and symptoms is one of the most accurate ways to make sure the cause of your pain is not dangerous. If your caregiver needs more information, he or she may order lab work or imaging tests (X-rays or MRIs).However, even if imaging tests show changes in your back, this usually does not require surgery. HOME CARE INSTRUCTIONS For many people,  back pain returns.Since low back pain is rarely dangerous, it is often a condition that people can learn to [facility] their own.   Remain active. It is stressful on the back to sit or stand in one place. Do not sit, drive, or stand in one place for more than 30 minutes at a time. Take short walks on level surfaces as soon as pain allows.Try to increase the length of time you walk each day.   Do not stay in bed.Resting more than 1 or 2 days can delay your recovery.   Do not avoid exercise or work.Your body is made to move.It is not dangerous to be active, even though your back may hurt.Your back will likely heal faster if you return to being active before your pain is gone.   Pay attention to your body when you bend and lift. Many people have less discomfortwhen lifting if they bend their knees, keep the load close to their bodies,and avoid twisting. Often, the most comfortable positions are those that put less stress on your recovering back.   Find a comfortable position to sleep. Use a firm mattress and lie on your side with your knees slightly bent. If you lie on your back, put a pillow under your knees.   Only take over-the-counter or prescription medicines as directed by your caregiver. Over-the-counter medicines to reduce pain and inflammation are often the most helpful.Your caregiver may prescribe muscle relaxant drugs.These medicines help dull your pain so you can more quickly return to your normal activities and healthy exercise.   Put ice on the injured area.   Put ice in a plastic bag.   Place a towel between your skin and the bag.   Leave the ice on for 15 to 20 minutes, 3 to 4 times a day for the first 2 to 3 days. After that, ice and heat may be alternated to reduce pain and spasms.   Ask your caregiver about trying back exercises and gentle massage. This may be of some benefit.   Avoid feeling anxious or stressed.Stress increases muscle tension and can worsen back  pain.It is important to recognize when you are anxious or stressed and learn ways to manage it.Exercise is a great option.  SEEK MEDICAL CARE IF:  You have pain that is not relieved with rest or medicine.   You have pain that does not improve in 1 week.   You have new symptoms.   You are generally not feeling well.  SEEK IMMEDIATE MEDICAL CARE IF:   You have pain that radiates from your back into your legs.   You develop new bowel or bladder control problems.   You have unusual weakness or numbness in your arms or legs.   You develop nausea or vomiting.   You develop abdominal pain.   You feel faint.  Document Released: 07/14/2005 Document Revised: 07/03/2011 Document Reviewed: 12/02/2010  Dental Pain A tooth ache may be caused by cavities (tooth decay).  Cavities expose the nerve of the tooth to air and hot or cold temperatures. It may come from an infection or abscess (also called a boil or furuncle) around your tooth. It is also often caused by dental caries (tooth decay). This causes the pain you are having. DIAGNOSIS  Your caregiver can diagnose this problem by exam. TREATMENT  If caused by an infection, it may be treated with medications which kill germs (antibiotics) and pain medications as prescribed by your caregiver. Take medications as directed.  Only take over-the-counter or prescription medicines for pain, discomfort, or fever as directed by your caregiver.  Whether the tooth ache today is caused by infection or dental disease, you should see your dentist as soon as possible for further care.  SEEK MEDICAL CARE IF: The exam and treatment you received today has been provided on an emergency basis only. This is not a substitute for complete medical or dental care. If your problem worsens or new problems (symptoms) appear, and you are unable to meet with your dentist, call or return to this location. SEEK IMMEDIATE MEDICAL CARE IF:  You have a fever.  You develop  redness and swelling of your face, jaw, or neck.  You are unable to open your mouth.  You have severe pain uncontrolled by pain medicine.  MAKE SURE YOU:  Understand these instructions.  Will watch your condition.  Will get help right away if you are not doing well or get worse.  Document Released: 07/14/2005 Document Revised: 07/03/2011 Document Reviewed: 03/01/2008 [facility] Patient Information 2012 [place], [place]. ExitCare Patient Information 2012 [place], [place].

## 2011-10-22 NOTE — ED Provider Notes (Signed)
Medical screening examination/treatment/procedure(s) were performed by non-physician practitioner and as supervising physician I was immediately available for consultation/collaboration.   Laray Anger, DO 10/22/11 [phone]

## 2011-11-19 ENCOUNTER — Emergency Department (HOSPITAL_COMMUNITY)
Admission: EM | Admit: 2011-11-19 | Discharge: 2011-11-19 | Disposition: A | Discharge status: Home / Self Care | Payer: Self-pay | Attending: Emergency Medicine | Admitting: Emergency Medicine

## 2011-11-19 ENCOUNTER — Encounter (HOSPITAL_COMMUNITY): Payer: Self-pay | Admitting: *Deleted

## 2011-11-19 ENCOUNTER — Emergency Department (HOSPITAL_COMMUNITY): Payer: Self-pay

## 2011-11-19 DIAGNOSIS — R259 Unspecified abnormal involuntary movements: Secondary | ICD-10-CM | POA: Insufficient documentation

## 2011-11-19 DIAGNOSIS — F29 Unspecified psychosis not due to a substance or known physiological condition: Secondary | ICD-10-CM | POA: Insufficient documentation

## 2011-11-19 DIAGNOSIS — R42 Dizziness and giddiness: Secondary | ICD-10-CM | POA: Insufficient documentation

## 2011-11-19 DIAGNOSIS — R61 Generalized hyperhidrosis: Secondary | ICD-10-CM | POA: Insufficient documentation

## 2011-11-19 DIAGNOSIS — R51 Headache: Secondary | ICD-10-CM | POA: Insufficient documentation

## 2011-11-19 DIAGNOSIS — M62838 Other muscle spasm: Secondary | ICD-10-CM | POA: Insufficient documentation

## 2011-11-19 DIAGNOSIS — Z79899 Other long term (current) drug therapy: Secondary | ICD-10-CM | POA: Insufficient documentation

## 2011-11-19 LAB — COMPREHENSIVE METABOLIC PANEL
Albumin: 3.7 g/dL (ref 3.5–5.2)
Alkaline Phosphatase: 91 U/L (ref 39–117)
BUN: 12 mg/dL (ref 6–23)
Calcium: 9.1 mg/dL (ref 8.4–10.5)
Creatinine, Ser: 0.64 mg/dL (ref 0.50–1.10)
Potassium: 3.9 mEq/L (ref 3.5–5.1)
Total Protein: 6.7 g/dL (ref 6.0–8.3)

## 2011-11-19 LAB — DIFFERENTIAL
Basophils Relative: 0 % (ref 0–1)
Eosinophils Absolute: 0 10*3/uL (ref 0.0–0.7)
Monocytes Relative: 3 % (ref 3–12)
Neutrophils Relative %: 84 % — ABNORMAL HIGH (ref 43–77)

## 2011-11-19 LAB — GLUCOSE, CAPILLARY: Glucose-Capillary: 173 mg/dL — ABNORMAL HIGH (ref 70–99)

## 2011-11-19 LAB — CBC
Hemoglobin: 13.2 g/dL (ref 12.0–15.0)
MCH: 28.8 pg (ref 26.0–34.0)
MCHC: 32.4 g/dL (ref 30.0–36.0)
Platelets: 239 10*3/uL (ref 150–400)
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
## 2011-11-19 NOTE — ED Notes (Signed)
Pt ambulatory to restroom without issue.

## 2011-11-19 NOTE — ED Notes (Signed)
Patient transported to CT 

## 2011-11-19 NOTE — ED Notes (Signed)
Pt reports she had an episode of feeling dizzy and her left leg started shaking and then both her hands started shaking. Pt reports this episode started this AM and then lasted about an hour.  Pt reports she was getting ready for work and did not eat breakfast prior to episode. Pt reports she ate a honey bun and felt better. Shaking and dizziness resolved. Pt reports she feels like her symptoms are gone. Pt denies history of similar issues. Pt denies issues with hypoglycemia or hypotension.

## 2011-11-19 NOTE — Discharge Instructions (Signed)
[facility] with [facility]. Return here for persistent tremors to your arms or legs or any other problems

## 2011-11-19 NOTE — ED Notes (Signed)
Pt reports nausea now.

## 2011-11-19 NOTE — ED Notes (Signed)
Urine collected and placed at bedside.  

## 2012-02-17 ENCOUNTER — Encounter (HOSPITAL_COMMUNITY): Payer: Self-pay | Admitting: Emergency Medicine

## 2012-02-17 ENCOUNTER — Emergency Department (HOSPITAL_COMMUNITY)
Admission: EM | Admit: 2012-02-17 | Discharge: 2012-02-17 | Disposition: A | Discharge status: Home / Self Care | Payer: Self-pay | Attending: Emergency Medicine | Admitting: Emergency Medicine

## 2012-02-17 ENCOUNTER — Emergency Department (HOSPITAL_COMMUNITY): Payer: Self-pay

## 2012-02-17 DIAGNOSIS — R42 Dizziness and giddiness: Secondary | ICD-10-CM | POA: Insufficient documentation

## 2012-02-17 DIAGNOSIS — Z79899 Other long term (current) drug therapy: Secondary | ICD-10-CM | POA: Insufficient documentation

## 2012-02-17 DIAGNOSIS — I498 Other specified cardiac arrhythmias: Secondary | ICD-10-CM | POA: Insufficient documentation

## 2012-02-17 DIAGNOSIS — R001 Bradycardia, unspecified: Secondary | ICD-10-CM

## 2012-02-17 LAB — CBC WITH DIFFERENTIAL/PLATELET
Eosinophils Relative: 3 % (ref 0–5)
HCT: 40.4 % (ref 36.0–46.0)
Hemoglobin: 13.7 g/dL (ref 12.0–15.0)
Lymphocytes Relative: 30 % (ref 12–46)
Lymphs Abs: 2 10*3/uL (ref 0.7–4.0)
MCV: 84.2 fL (ref 78.0–100.0)
Monocytes Absolute: 0.5 10*3/uL (ref 0.1–1.0)
Monocytes Relative: 7 % (ref 3–12)
RBC: 4.8 MIL/uL (ref 3.87–5.11)
WBC: 6.9 10*3/uL (ref 4.0–10.5)

## 2012-02-17 LAB — COMPREHENSIVE METABOLIC PANEL
CO2: 23 mEq/L (ref 19–32)
Calcium: 9.6 mg/dL (ref 8.4–10.5)
Creatinine, Ser: 0.8 mg/dL (ref 0.50–1.10)
GFR calc Af Amer: >90 mL/min (ref >90)
GFR calc non Af Amer: 79 mL/min — ABNORMAL LOW (ref >90)
Glucose, Bld: 79 mg/dL (ref 70–99)
Total Bilirubin: 0.4 mg/dL (ref 0.3–1.2)

## 2012-02-17 LAB — GLUCOSE, CAPILLARY: Glucose-Capillary: 77 mg/dL (ref 70–99)

## 2012-02-17 MED ORDER — SODIUM CHLORIDE 0.9 % IV SOLN
INTRAVENOUS | Status: DC
  Administered 2012-02-17 (×2): via INTRAVENOUS

## 2012-02-17 MED ORDER — SODIUM CHLORIDE 0.9 % IV BOLUS (SEPSIS)
500.0000 mL | Freq: Once | INTRAVENOUS | Status: AC
  Administered 2012-02-17: 500 mL via INTRAVENOUS

## 2012-02-17 MED ORDER — MECLIZINE HCL 25 MG PO TABS: 25.0000 mg | ORAL_TABLET | Freq: Four times a day (QID) | ORAL | Status: AC | PRN

## 2012-02-17 MED ORDER — HYDROCODONE-ACETAMINOPHEN 5-325 MG PO TABS: 1.0000 | ORAL_TABLET | ORAL | Status: AC | PRN

## 2012-02-17 NOTE — Progress Notes (Signed)
CM spoke with pt who confirms self pay [facility] resident with no have a pcp county. Discussed the importance of a pcp for f/u. Reviewed Health connect number to assist with finding self pay provider close to pt's residence. Pt states she was being [facility] Dr Clelia Croft but reports Dr Clelia Croft no longer at [facility] Reviewed resources for [facility], general medial clinics, [email or web address], housing, DSS, health Department and other resources in [employer] including crisis programs Pt voiced understanding and appreciation of resources provided

## 2012-02-17 NOTE — Discharge Instructions (Signed)
RESOURCE GUIDE  Chronic Pain Problems: Contact [facility]  [phone] Patients need to be referred by their primary care doctor.  Insufficient Money for Medicine: Contact United Way:  call "211" or [facility] [phone].  No Primary Care Doctor: - Call Health Connect  [phone] - can help you locate a primary care doctor that  accepts your insurance, provides certain services, etc. - Physician Referral Service- [phone]  Agencies that provide inexpensive medical care: - [facility]  [phone] - [facility]  [phone] - [facility]  [phone] - Women's Clinic  [phone] - [facility]  [phone] Haynes Bast[facility]  [phone]  Medicaid-accepting [facility] [place] Providers: - [facility]- [address], [address]  [phone], Mon-Fri 9am-7pm, Sat 9am-1pm - [facility]- [address], [address] [place]  [phone] - [facility]- [address], [address] [place]  [phone] [facility]- [facility]- [address] [place] [address]  [phone] - Renaye RakersVeita Bland- [address], [address], [phone]  Only accepts [place] patients after they have their name  applied to their card  Self Pay (no insurance) in [place]: - Sickle Cell Patients: Dr Willey BladeEric Dean, [facility]  [address], [phone] - [facility]- [address]  [phone]       Redge Gainer-     [facility] [place]- [address], [address]       -     [facility]- see information above (Speak to [employer]am H if you do not have insurance)       -  [facility]- [address], [phone]       -  [facility] [place]- [address],  [phone]       -  [facility]- [address] [place] [address], [phone]       -  Dr Julio Sickssei-Bonsu-  [address], [address], [place], [phone]       -  [facility]- [address], [phone]       -  [facility] - [address], [phone], also [address], [phone]       -    [facility]- [address], [phone], 1st & 3rd Saturday   every month, 10am-1pm  1) Find a Doctor and Pay Out of Pocket Although you won't have to find out who is covered by your insurance plan, it is a good idea to ask around and get recommendations. You will then need to call the office and see if the doctor you have chosen will accept you as a new patient and what types of options they offer for patients who are self-pay. Some doctors offer discounts or will set up payment plans for their patients who do not have insurance, but you will need to ask so you aren't surprised when you get to your appointment.  2) Contact Your Local Health Department Not all health departments have doctors that can see patients for sick visits, but many do, so it is worth a call to see if yours does. If you don't know where your local health department is, you can check in your phone book. The CDC also has a tool to help you locate your state's health department, and many state websites also have  listings of all of their local health departments.  3) Find a Walk-in Clinic If your illness is not likely to be very severe or complicated, you may want to try a walk in clinic. These are popping up all over the country in pharmacies, drugstores, and shopping centers. They're usually staffed by nurse practitioners or physician assistants that have been trained to treat common illnesses and complaints. They're usually fairly quick and inexpensive. However, if you have serious medical issues or chronic medical problems, these are probably not your best option  STD Testing - [facility] Department of [facility] Luis Llorons Torres, STD Clinic, [address] [place], phone [phone] or [phone].  Monday - Friday, call for an appointment. [facility]  Department of [employer], STD Clinic, [address], [place], phone [phone] or [phone].  Monday - Friday, call for an appointment.  Abuse/Neglect: [facility] Child Abuse Hotline (908) 293-8840 [facility] Child Abuse Hotline (414) 337-1068 (After Hours)  Emergency Shelter:  [facility] [phone]) 873-4430  Maternity Homes: - [facility] at the [place] [facility] [phone] - [facility] [phone]  MRSA Hotline #:   (508)367-7619  [facility] Resources  [facility]  [employer] [facility] Dept. [address]                 [address]         [address] [place] [address]  Blondell Reveal Phone:  [phone]                                  Phone:  [phone]                   Phone:  [phone]  [facility], [phone] - [phone]       -     [facility] in [place], [address],                                  [phone], [facility] Child Abuse Hotline (812)289-2172 or [phone] (After Hours)   Behavioral Health Services  Substance Abuse Resources: - Alcohol and Drug Services  [phone] - [facility] [phone] - [facility] [place] [phone] Floydene Flock [phone] - Residential & Outpatient Substance Abuse Program  [phone]  Psychological Services: [facility]  ([phone] Services  [phone] - [facility], [phone] [place]. [address], [place], ACCESS LINE: 9311756726 or [phone], [employer].[email or web address]  Dental Assistance  If unable to pay or uninsured, contact:  [facility] or [facility]. to become qualified for the adult dental  clinic.  Patients with Medicaid: [facility] [phone] [address], [phone] [address] [phone]  If unable  to pay, or uninsured, contact [facility] [phone]) or [facility] Department [phone] in [place], [phone] in [facility]) to become qualified for the adult dental clinic  Other Low-Cost Community Dental Services: - [facility]- [address] [place], [place], [place], [address], [phone], [address]. [phone], 2nd and 4th Thursday of the month at 6:30am.  10 clients each day by appointment, can sometimes see walk-in patients if someone does not show for an appointment. [facility]- [address] [place], [place], [phone], [phone] - [facility]- [address], [place], [place], [address], [phone] [facility] Department- [phone] [facility] Department- [phone] [facility] [phone]  Decrease metoprolol to one dose in the morning.  Medication for vertigo.  Need to attempt to find a primary care relationship.  Phone numbers given. CT scan of head negative
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
## 2012-02-17 NOTE — ED Notes (Signed)
Pt states when she was at the beach 2 weeks ago she got dizzy and couldn't stand. Got better but dizziness has returned. Pt c/o of slight pressure in her chest and has to take deep breathes. Pt c/o of nausea. Denies weakness.

## 2012-04-14 IMAGING — CT CT ABD-PELV W/ CM
2 of 6 series · 17 of 46 positions shown, 19 images · IV contrast (APPLIED)
Comparison: 03/23/2008

CLINICAL DATA: Lower abdominal pain.

CT ABDOMEN AND PELVIS WITH CONTRAST
TECHNIQUE: Multidetector CT imaging of the abdomen and pelvis was
performed following the standard protocol during bolus
administration of intravenous contrast.
Contrast: 100 ml [id number]

[Series 2: abd_pel 5.0 b40f st · axial · 0.67mm/px · z∈[-405,+20]mm · 14 of 96 slices shown, 16 images]
[im 6/96  soft-tissue]
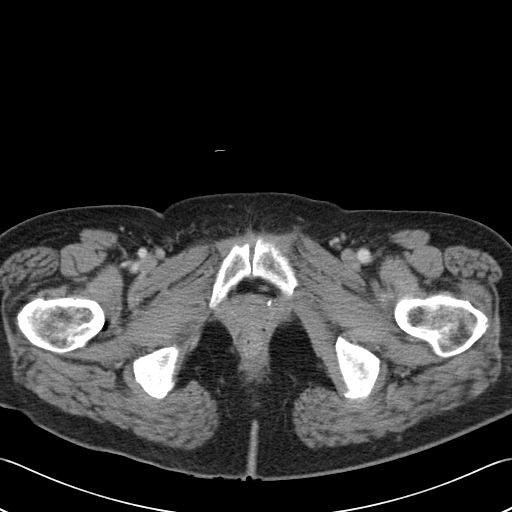
[im 6/96  bone]
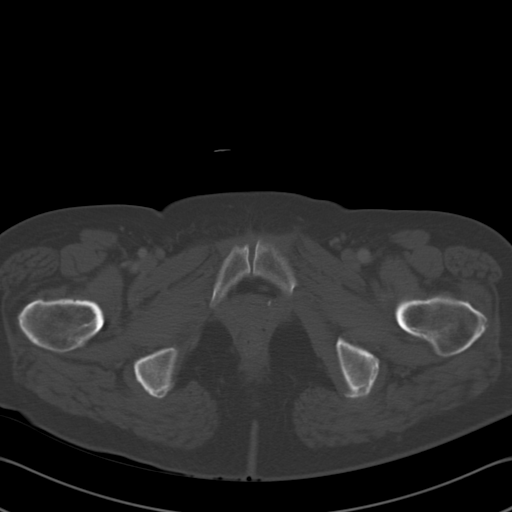
[im 11/96  soft-tissue]
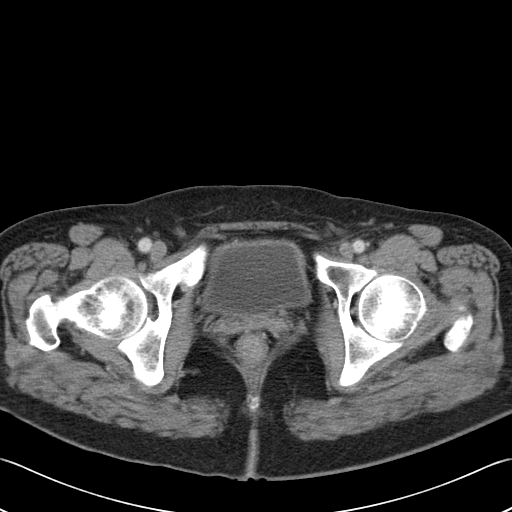
[im 21/96  soft-tissue]
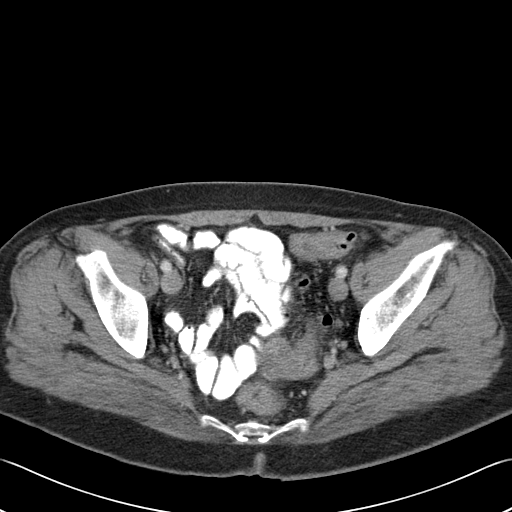
[im 26/96  soft-tissue]
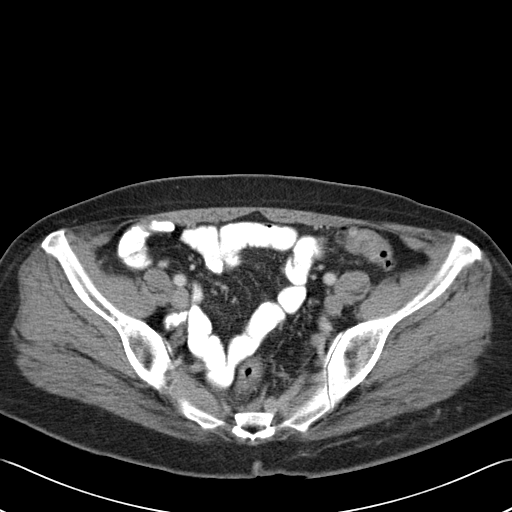
[im 31/96  soft-tissue]
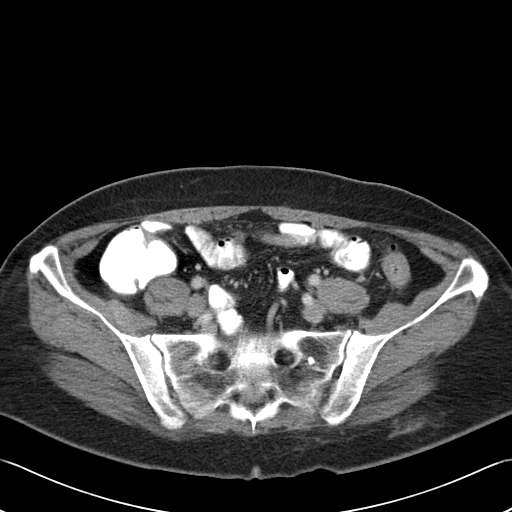
[im 41/96  soft-tissue]
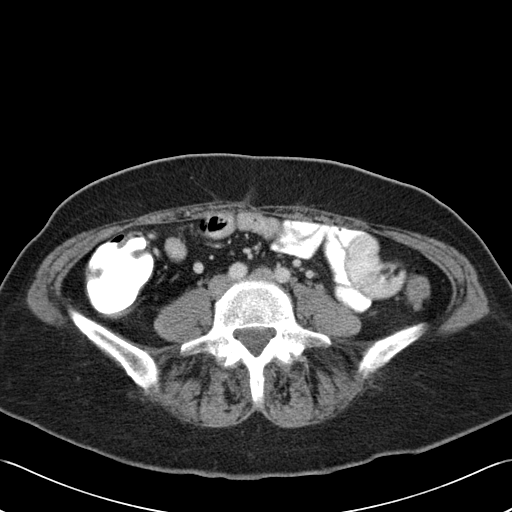
[im 46/96  soft-tissue]
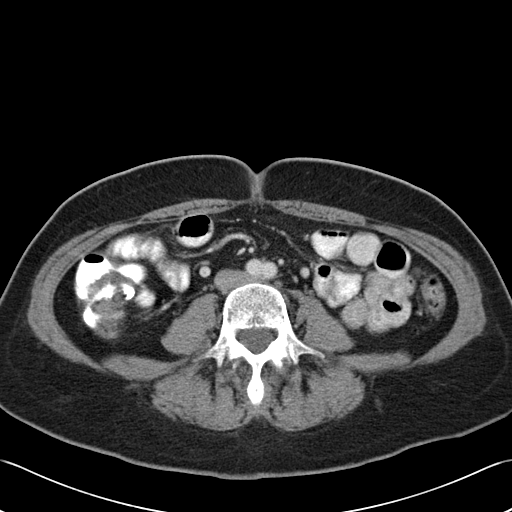
[im 51/96  soft-tissue]
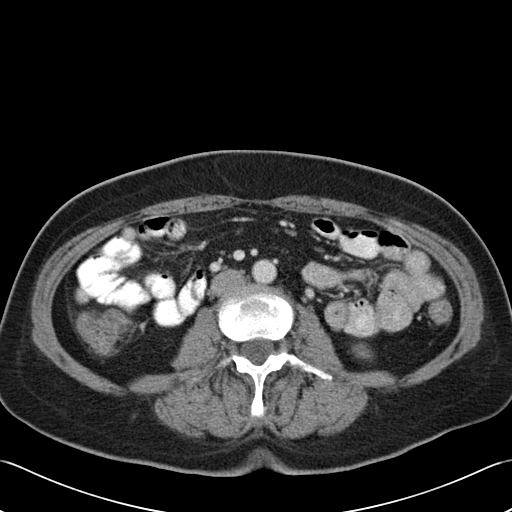
[im 56/96  soft-tissue]
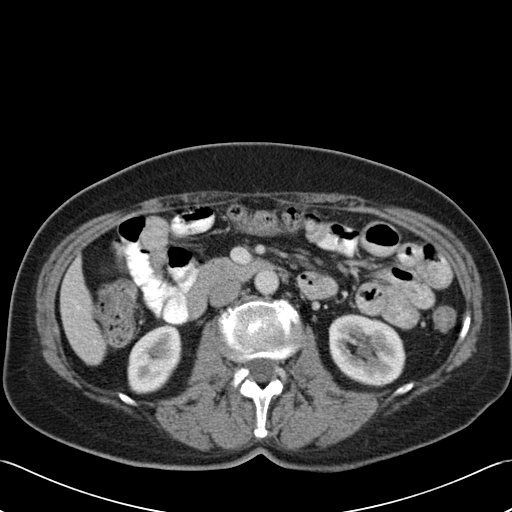
[im 56/96  bone]
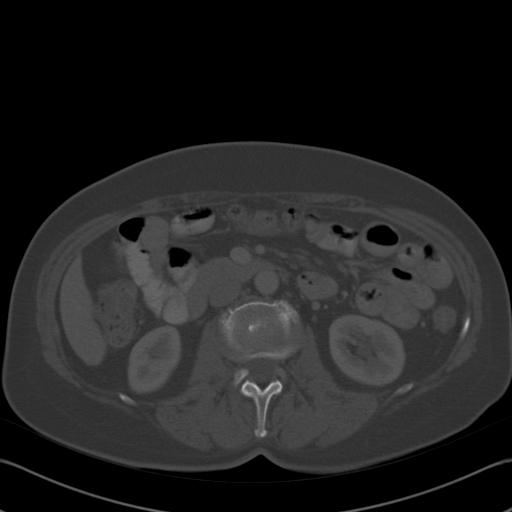
[im 66/96  soft-tissue]
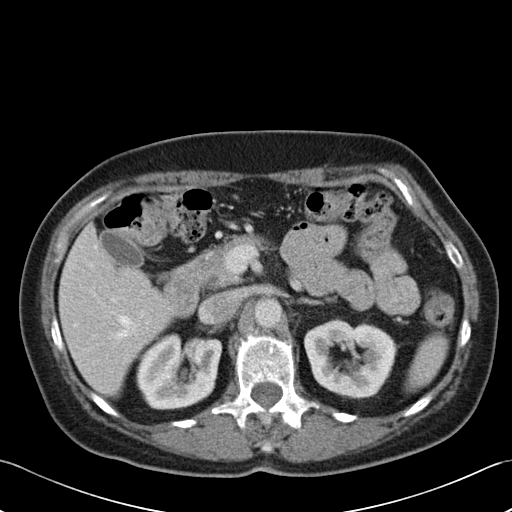
[im 71/96  soft-tissue]
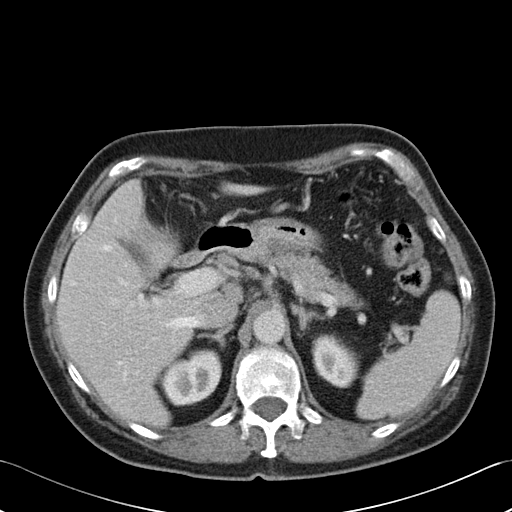
[im 76/96  soft-tissue]
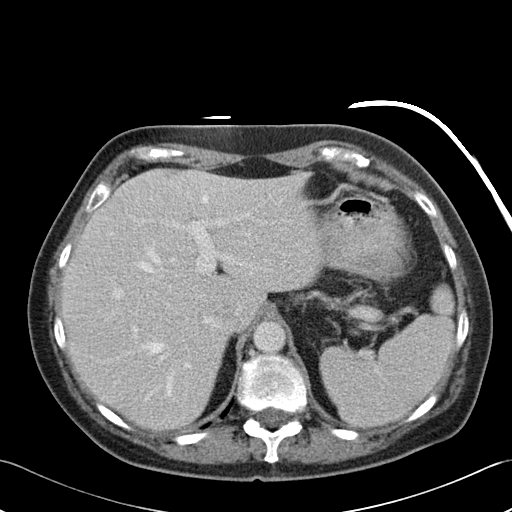
[im 86/96  soft-tissue]
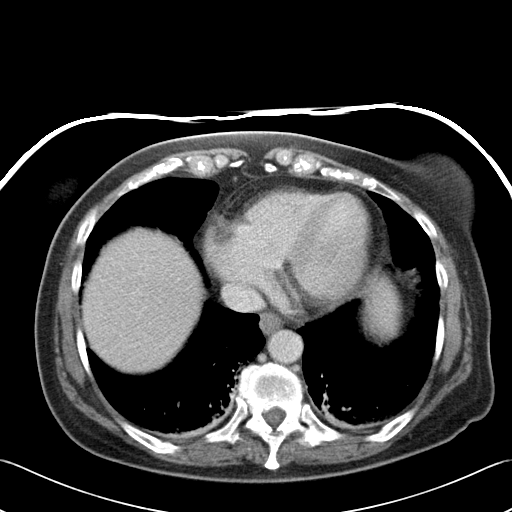
[im 91/96  soft-tissue]
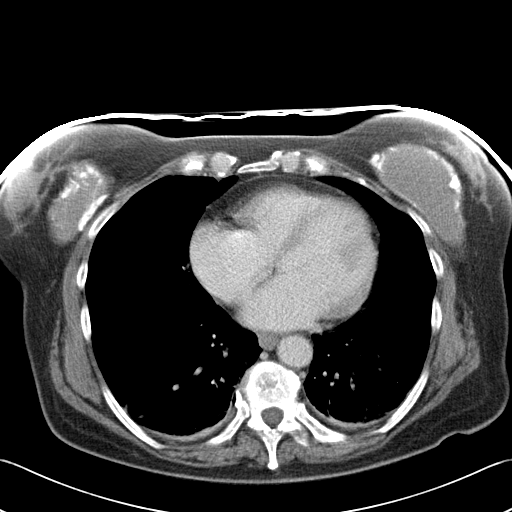

[Series 602: coronal · coronal · 0.97mm/px · 3 of 64 slices shown]
[im 22/64  soft-tissue]
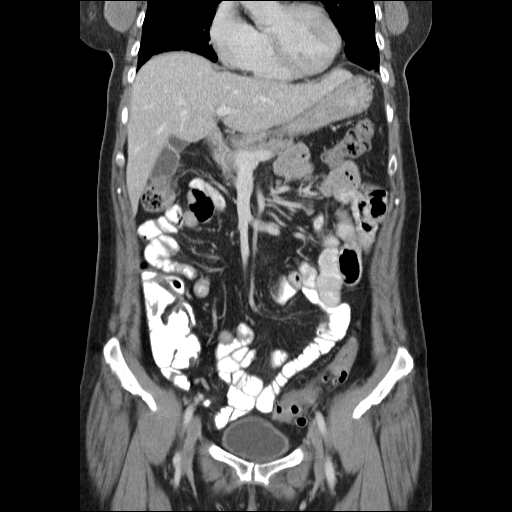
[im 29/64  soft-tissue]
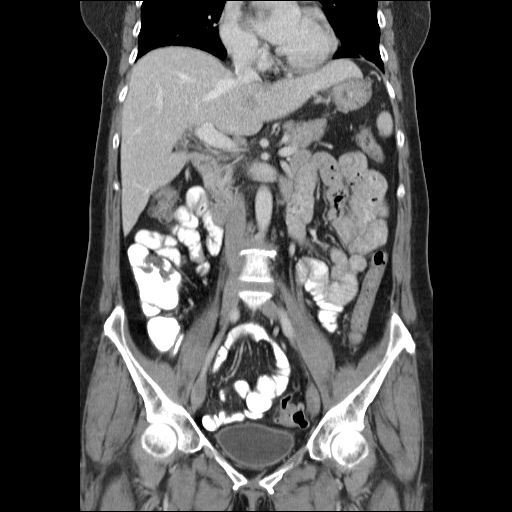
[im 36/64  soft-tissue]
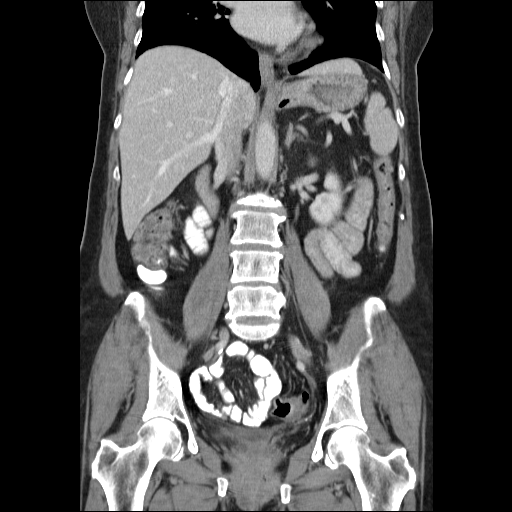

[17 of 46 positions shown; findings below may reference images not displayed]

FINDINGS: Linear subsegmental atelectasis is present in both lower
lobes.  Capsular calcification of bilateral breast implants noted.

Focal adipose deposition in the pancreatic head and uncinate
process noted, stable from 7778 and previously worked up by MRI.

The liver, spleen, gallbladder, and adrenal glands appear normal.

A 4 mm hypodense cyst in the left kidney upper pole appears stable.

No pathologic retroperitoneal or porta hepatis adenopathy is
identified.

The appendix appears normal, as the urinary bladder.  There is
descending and sigmoid colon diverticulosis.  There is some mild
wall thickening in the sigmoid colon which could reflect mild
colitis or minimal diverticulitis.  No diverticular abscess or
extraluminal gas noted.

A small Bartholin cyst appears stable.

The uterus is absent.  Ovaries are not visualized.
IMPRESSION: 1.  Slight wall thickening in the sigmoid colon possibly
manifestation of mild colitis or mild diverticulitis.
2.  Stable regional fatty infiltration of the pancreatic head and
uncinate process.  This appears benign.
3.  Linear subsegmental atelectasis in both lower lobes.

## 2012-06-25 ENCOUNTER — Emergency Department (HOSPITAL_COMMUNITY)
Admission: EM | Admit: 2012-06-25 | Discharge: 2012-06-25 | Disposition: A | Discharge status: Home / Self Care | Payer: Self-pay | Attending: Emergency Medicine | Admitting: Emergency Medicine

## 2012-06-25 ENCOUNTER — Encounter (HOSPITAL_COMMUNITY): Payer: Self-pay | Admitting: Emergency Medicine

## 2012-06-25 DIAGNOSIS — R51 Headache: Secondary | ICD-10-CM | POA: Insufficient documentation

## 2012-06-25 DIAGNOSIS — Z8679 Personal history of other diseases of the circulatory system: Secondary | ICD-10-CM | POA: Insufficient documentation

## 2012-06-25 DIAGNOSIS — R059 Cough, unspecified: Secondary | ICD-10-CM | POA: Insufficient documentation

## 2012-06-25 DIAGNOSIS — R05 Cough: Secondary | ICD-10-CM | POA: Insufficient documentation

## 2012-06-25 DIAGNOSIS — Z8719 Personal history of other diseases of the digestive system: Secondary | ICD-10-CM | POA: Insufficient documentation

## 2012-06-25 DIAGNOSIS — J3489 Other specified disorders of nose and nasal sinuses: Secondary | ICD-10-CM | POA: Insufficient documentation

## 2012-06-25 DIAGNOSIS — J329 Chronic sinusitis, unspecified: Secondary | ICD-10-CM | POA: Insufficient documentation

## 2012-06-25 DIAGNOSIS — H9209 Otalgia, unspecified ear: Secondary | ICD-10-CM | POA: Insufficient documentation

## 2012-06-25 MED ORDER — AZITHROMYCIN 250 MG PO TABS: 500.0000 mg | ORAL_TABLET | Freq: Every day | ORAL | Status: DC

## 2012-06-25 MED ORDER — HYDROCODONE-ACETAMINOPHEN 5-325 MG PO TABS: 1.0000 | ORAL_TABLET | ORAL | Status: DC | PRN
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
## 2012-06-25 NOTE — Progress Notes (Signed)
    CARE MANAGEMENT ED NOTE 06/25/2012  Patient:  Dorothy White, Dorothy White   Account Number:  [id number]  Date Initiated:  06/25/2012  Documentation initiated by:  Edd Arbour  Subjective/Objective Assessment:   61 yr old female self pay [facility] Had an appointment in 09/13 but was cancelled per pt by [facility] staff Pt states she was not given instructions after facility closed 03/26/12     Subjective/Objective Assessment Detail:   c/o sore throat facial pain 2 ED visits in last 6 months     Action/Plan:   CM reviewed instructions from [facility] 1) request medical records 2) [facility] 3) choose another available self pay [place] provider CM provided list of self pay providers, DSS, health department, medication resource   Action/Plan Detail:   Cm provided [email or web address] and outpatient pharmacy information along with financial resources for pt prior to d/c   Anticipated DC Date:  06/25/2012     Status Recommendation to Physician:   Result of Recommendation:    Other ED Services  Consult Working Plan    DC Planning Services  CM consult  [facility]  Other  Outpatient Services - Pt will follow up  PCP issues    Choice offered to / List presented to:            Status of service:  Completed, signed off  ED Comments:   ED Comments Detail:

## 2012-06-25 NOTE — ED Provider Notes (Signed)
Medical screening examination/treatment/procedure(s) were performed by non-physician practitioner and as supervising physician I was immediately available for consultation/collaboration.   Tayveon Lombardo M Jerni Selmer, MD 06/25/12 1228 

## 2012-06-25 NOTE — ED Notes (Signed)
2 week hx of sinus pressure, sore throat. 3 day hx of pressure over teeth on r/side of mouth. Denies ear pain

## 2012-06-28 NOTE — Care Management ED Note (Signed)
    CARE MANAGEMENT ED NOTE 06/25/2012  Patient:  Dorothy White,Dorothy White   Account Number:  [id number]  Date Initiated:  06/25/2012  Documentation initiated by:  GIBBS,KIMBERLY  Subjective/Objective Assessment:   61 yr old female self pay [facility] MD Had an appointment in 09/13 but was cancelled per pt by [facility] staff Pt states she was not given instructions after facility closed 03/26/12     Subjective/Objective Assessment Detail:   c/o sore throat facial pain 2 ED visits in last 6 months     Action/Plan:   CM reviewed [facility] 1) request medical records 2) [facility] 3) choose another available self pay [place] provider CM provided list of self pay providers, DSS, health department, medication resource   Action/Plan Detail:   Cm provided [email or web address] and outpatient pharmacy information along with financial resources for pt prior to d/c   Anticipated DC Date:  06/25/2012     Status Recommendation to Physician:   Result of Recommendation:    Other ED Services  Consult Working Plan    DC Planning Services  CM consult  [facility]  Other  Outpatient Services - Pt will follow up  PCP issues    Choice offered to / List presented to:            Status of service:  Completed, signed off  ED Comments:   ED Comments Detail:   

## 2012-06-29 ENCOUNTER — Emergency Department (HOSPITAL_COMMUNITY)
Admission: EM | Admit: 2012-06-29 | Discharge: 2012-06-29 | Disposition: A | Discharge status: Home / Self Care | Payer: Self-pay | Attending: Emergency Medicine | Admitting: Emergency Medicine

## 2012-06-29 DIAGNOSIS — J029 Acute pharyngitis, unspecified: Secondary | ICD-10-CM | POA: Insufficient documentation

## 2012-06-29 DIAGNOSIS — J329 Chronic sinusitis, unspecified: Secondary | ICD-10-CM | POA: Insufficient documentation

## 2012-06-29 DIAGNOSIS — Z79899 Other long term (current) drug therapy: Secondary | ICD-10-CM | POA: Insufficient documentation

## 2012-06-29 DIAGNOSIS — Z8719 Personal history of other diseases of the digestive system: Secondary | ICD-10-CM | POA: Insufficient documentation

## 2012-06-29 DIAGNOSIS — Z87891 Personal history of nicotine dependence: Secondary | ICD-10-CM | POA: Insufficient documentation

## 2012-06-29 DIAGNOSIS — H9209 Otalgia, unspecified ear: Secondary | ICD-10-CM | POA: Insufficient documentation

## 2012-06-29 DIAGNOSIS — Z8679 Personal history of other diseases of the circulatory system: Secondary | ICD-10-CM | POA: Insufficient documentation

## 2012-06-29 MED ORDER — DOXYCYCLINE HYCLATE 100 MG PO CAPS: 100.0000 mg | ORAL_CAPSULE | Freq: Two times a day (BID) | ORAL | Status: DC

## 2012-06-29 NOTE — ED Provider Notes (Signed)
Medical screening examination/treatment/procedure(s) were performed by non-physician practitioner and as supervising physician I was immediately available for consultation/collaboration.   Dione Booze, MD 06/29/12 [phone]
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
## 2012-06-29 NOTE — ED Notes (Signed)
Pt was seen here Friday for Sinusitis and was given 3 day treatment of azithromycin. Pt has completed tx but had low grade fever yesterday and still has pain in right sinus area and teeth. Pt is here for follow up.

## 2012-09-14 ENCOUNTER — Encounter (HOSPITAL_COMMUNITY): Payer: Self-pay

## 2012-09-14 ENCOUNTER — Emergency Department (HOSPITAL_COMMUNITY)
Admission: EM | Admit: 2012-09-14 | Discharge: 2012-09-14 | Disposition: A | Discharge status: Home / Self Care | Payer: Self-pay | Source: Home / Self Care | Attending: Family Medicine | Admitting: Family Medicine

## 2012-09-14 DIAGNOSIS — K219 Gastro-esophageal reflux disease without esophagitis: Secondary | ICD-10-CM

## 2012-09-14 DIAGNOSIS — J309 Allergic rhinitis, unspecified: Secondary | ICD-10-CM

## 2012-09-14 DIAGNOSIS — J329 Chronic sinusitis, unspecified: Secondary | ICD-10-CM

## 2012-09-14 LAB — CBC WITH DIFFERENTIAL/PLATELET
Hemoglobin: 14.4 g/dL (ref 12.0–15.0)
Lymphs Abs: 1.9 10*3/uL (ref 0.7–4.0)
MCH: 28.6 pg (ref 26.0–34.0)
Monocytes Relative: 7 % (ref 3–12)
Neutro Abs: 3.4 10*3/uL (ref 1.7–7.7)
Neutrophils Relative %: 58 % (ref 43–77)
RBC: 5.03 MIL/uL (ref 3.87–5.11)

## 2012-09-14 LAB — COMPREHENSIVE METABOLIC PANEL
Alkaline Phosphatase: 105 U/L (ref 39–117)
BUN: 15 mg/dL (ref 6–23)
CO2: 25 mEq/L (ref 19–32)
Chloride: 102 mEq/L (ref 96–112)
GFR calc Af Amer: >90 mL/min (ref >90)
Glucose, Bld: 88 mg/dL (ref 70–99)
Potassium: 4 mEq/L (ref 3.5–5.1)
Total Bilirubin: 0.5 mg/dL (ref 0.3–1.2)

## 2012-09-14 LAB — TSH: TSH: 1.258 u[IU]/mL (ref 0.350–4.500)

## 2012-09-14 MED ORDER — CETIRIZINE HCL 10 MG PO TABS: 10.0000 mg | ORAL_TABLET | Freq: Every day | ORAL | Status: DC

## 2012-09-14 MED ORDER — DOXYCYCLINE HYCLATE 100 MG PO CAPS: 100.0000 mg | ORAL_CAPSULE | Freq: Two times a day (BID) | ORAL | Status: DC

## 2012-09-14 MED ORDER — FLUTICASONE PROPIONATE 50 MCG/ACT NA SUSP: 2.0000 | Freq: Every day | NASAL | Status: DC

## 2012-09-14 MED ORDER — HYDROCODONE-ACETAMINOPHEN 5-325 MG PO TABS: 1.0000 | ORAL_TABLET | Freq: Three times a day (TID) | ORAL | Status: DC | PRN
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
## 2012-09-14 NOTE — ED Notes (Signed)
Patient states had a sinus infection back in November and still is not feeling right Having headaches congested pain in cheek area and front of head Feels like the glands in her neck are swollen

## 2012-09-15 ENCOUNTER — Telehealth (HOSPITAL_COMMUNITY): Payer: Self-pay

## 2012-09-15 NOTE — Progress Notes (Signed)
Quick Note:  Please inform patient that her labs came back within normal limits.  Rodney Langton, MD, CDE, FAAFP [facility] [place], [place]   ______

## 2012-09-15 NOTE — Telephone Encounter (Signed)
Message copied by Lestine Mount on Wed Sep 15, 2012  3:46 PM ------      Message from: Cleora Fleet      Created: Wed Sep 15, 2012 10:01 AM       Please inform patient that her labs came back within normal limits.            Rodney Langton, MD, CDE, FAAFP      [facility]      [facility]      [place], [place]        ------

## 2012-09-15 NOTE — Telephone Encounter (Signed)
Message copied by Lestine Mount on Wed Sep 15, 2012  4:43 PM ------      Message from: Cleora Fleet      Created: Wed Sep 15, 2012 10:01 AM       Please inform patient that her labs came back within normal limits.            Rodney Langton, MD, CDE, FAAFP      [facility]      [facility]      [place], [place]        ------

## 2012-09-15 NOTE — Telephone Encounter (Signed)
Message copied by Lestine Mount on Wed Sep 15, 2012  4:34 PM ------      Message from: Cleora Fleet      Created: Wed Sep 15, 2012 10:01 AM       Please inform patient that her labs came back within normal limits.            Rodney Langton, MD, CDE, FAAFP      [facility]      [facility]      [place], [place]        ------

## 2012-10-06 ENCOUNTER — Emergency Department (HOSPITAL_COMMUNITY)
Admission: EM | Admit: 2012-10-06 | Discharge: 2012-10-06 | Disposition: A | Discharge status: Home / Self Care | Payer: No Typology Code available for payment source | Source: Home / Self Care

## 2012-10-06 ENCOUNTER — Encounter (HOSPITAL_COMMUNITY): Payer: Self-pay

## 2012-10-06 DIAGNOSIS — H6692 Otitis media, unspecified, left ear: Secondary | ICD-10-CM

## 2012-10-06 DIAGNOSIS — H6690 Otitis media, unspecified, unspecified ear: Secondary | ICD-10-CM

## 2012-10-06 DIAGNOSIS — J029 Acute pharyngitis, unspecified: Secondary | ICD-10-CM

## 2012-10-06 DIAGNOSIS — F411 Generalized anxiety disorder: Secondary | ICD-10-CM

## 2012-10-06 DIAGNOSIS — J309 Allergic rhinitis, unspecified: Secondary | ICD-10-CM

## 2012-10-06 MED ORDER — HYDROCODONE-ACETAMINOPHEN 5-325 MG PO TABS: 1.0000 | ORAL_TABLET | Freq: Three times a day (TID) | ORAL | Status: DC | PRN

## 2012-10-06 MED ORDER — AZITHROMYCIN 250 MG PO TABS: 250.0000 mg | ORAL_TABLET | Freq: Every day | ORAL | Status: DC

## 2012-10-06 NOTE — ED Notes (Signed)
Complains of sore throat past three days Swollen glands, headache and fever
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
## 2012-10-13 ENCOUNTER — Encounter (HOSPITAL_COMMUNITY): Payer: Self-pay

## 2012-10-13 ENCOUNTER — Emergency Department (HOSPITAL_COMMUNITY)
Admission: EM | Admit: 2012-10-13 | Discharge: 2012-10-13 | Disposition: A | Discharge status: Home / Self Care | Payer: No Typology Code available for payment source | Source: Home / Self Care

## 2012-10-13 DIAGNOSIS — J029 Acute pharyngitis, unspecified: Secondary | ICD-10-CM

## 2012-10-13 MED ORDER — METHYLPREDNISOLONE 4 MG PO KIT: PACK | ORAL | Status: DC

## 2012-10-13 MED ORDER — IBUPROFEN 600 MG PO TABS: 600.0000 mg | ORAL_TABLET | Freq: Three times a day (TID) | ORAL | Status: DC | PRN

## 2012-10-13 NOTE — ED Notes (Signed)
Patient Demographics  Dorothy White, is a 62 y.o. female  JXB:[id number]  ZHY:[id number]  DOB - [birth year]  Chief Complaint  Patient presents with  . Sore Throat        Subjective:   Dorothy White comes with 2-3 weeks history of mild runny nose, pressure sensation behind her left ear, some postnasal drip, sensation of sore throat, no headache, no fever chills, mild dry cough, no chest pain, no shortness of breath no abdominal pain. She was recently to this clinic and was treated with one week course of azithromycin without much benefit.  Objective:    Filed Vitals:   10/13/12 1542  BP: 114/72  Pulse: 98  Temp: 98.2 F (36.8 C)  TempSrc: Oral     Exam  Awake Alert, Oriented X 3, No new F.N deficits, Normal affect [place].AT,PERRAL, Supple Neck,No JVD, No cervical lymphadenopathy appriciated. Mild postnasal drip, bilaterally gait exam is stable, good light reflex in both years. Symmetrical Chest wall movement, Good air movement bilaterally, CTAB RRR,No Gallops,Rubs or new Murmurs, No Parasternal Heave +ve B.Sounds, Abd Soft, Non tender, No organomegaly appriciated, No rebound - guarding or rigidity. No Cyanosis, Clubbing or edema, No new Rash or bruise       Data Review   CBC No results found for this basename: WBC, HGB, HCT, PLT, MCV, MCH, MCHC, RDW, NEUTRABS, LYMPHSABS, MONOABS, EOSABS, BASOSABS, BANDABS, BANDSABD,  in the last 168 hours  Chemistries   No results found for this basename: NA, K, CL, CO2, GLUCOSE, BUN, CREATININE, GFRCGP, CALCIUM, MG, AST, ALT, ALKPHOS, BILITOT,  in the last 168 hours ------------------------------------------------------------------------------------------------------------------ No results found for this basename: HGBA1C,  in the last 72 hours ------------------------------------------------------------------------------------------------------------------ No results found for this basename: CHOL, HDL, LDLCALC, TRIG, CHOLHDL,  LDLDIRECT,  in the last 72 hours ------------------------------------------------------------------------------------------------------------------ No results found for this basename: TSH, T4TOTAL, FREET3, T3FREE, THYROIDAB,  in the last 72 hours ------------------------------------------------------------------------------------------------------------------ No results found for this basename: VITAMINB12, FOLATE, FERRITIN, TIBC, IRON, RETICCTPCT,  in the last 72 hours  Coagulation profile  No results found for this basename: INR, PROTIME,  in the last 168 hours     Prior to Admission medications   Medication Sig Start Date End Date Taking? Authorizing Provider  buPROPion (WELLBUTRIN XL) 300 MG 24 hr tablet Take 300 mg by mouth daily.    Historical Provider, MD  cetirizine (ZYRTEC) 10 MG tablet Take 1 tablet (10 mg total) by mouth daily. 09/14/12   Clanford Cyndie Mull, MD  FLUoxetine (PROZAC) 20 MG capsule Take 60 mg by mouth daily. 3 capsules for 60 mg dose    Historical Provider, MD  fluticasone (FLONASE) 50 MCG/ACT nasal spray Place 2 sprays into the nose daily. 09/14/12   Clanford Cyndie Mull, MD  ibuprofen (ADVIL,MOTRIN) 600 MG tablet Take 1 tablet (600 mg total) by mouth every 8 (eight) hours as needed for pain. Pain  Pt takes 4 tabs for 800mg  dosage 10/13/12   Leroy Sea, MD  methylPREDNISolone (MEDROL, PAK,) 4 MG tablet follow package directions 10/13/12   Leroy Sea, MD  Multiple Vitamin (MULTIVITAMIN WITH MINERALS) TABS Take 1 tablet by mouth daily.    Historical Provider, MD  Probiotic Product (PROBIOTIC DAILY PO) Take 1 capsule by mouth daily.    Historical Provider, MD  traZODone (DESYREL) 100 MG tablet Take 100-150 mg by mouth at bedtime.     Historical Provider, MD     Assessment & Plan   Allergic pharyngitis with likely but blocked eustachian tube in  the left ear.- Patient was recently given a course of azithromycin without much benefit, at this time I think her  problems are due to allergic congestion, her Zyrtec will be continued, have advised her to do lukewarm water gargles and gentle steam inhalations 4 times a day, we'll give her a course of Medrol Dosepak to reduce any allergic inflammation in her eustachian tube. When necessary Motrin for any discomfort the eustachian tube. Followup as needed.     Follow-up Information   Follow up with this clinic In 4 weeks. (As needed)        Leroy Sea M.D on 10/13/2012 at 4:00 PM   Leroy Sea, MD 10/13/12 [phone]

## 2012-10-13 NOTE — ED Notes (Signed)
Patient states was seen last week for sore throat States is not any better today also has swollen glands On left side of neck

## 2012-10-25 ENCOUNTER — Emergency Department (HOSPITAL_COMMUNITY)
Admission: EM | Admit: 2012-10-25 | Discharge: 2012-10-25 | Disposition: A | Discharge status: Home / Self Care | Payer: Self-pay | Attending: Emergency Medicine | Admitting: Emergency Medicine

## 2012-10-25 ENCOUNTER — Encounter (HOSPITAL_COMMUNITY): Payer: Self-pay | Admitting: Emergency Medicine

## 2012-10-25 ENCOUNTER — Emergency Department (HOSPITAL_COMMUNITY): Payer: Self-pay

## 2012-10-25 DIAGNOSIS — J329 Chronic sinusitis, unspecified: Secondary | ICD-10-CM | POA: Insufficient documentation

## 2012-10-25 DIAGNOSIS — IMO0002 Reserved for concepts with insufficient information to code with codable children: Secondary | ICD-10-CM | POA: Insufficient documentation

## 2012-10-25 DIAGNOSIS — J3489 Other specified disorders of nose and nasal sinuses: Secondary | ICD-10-CM | POA: Insufficient documentation

## 2012-10-25 DIAGNOSIS — Z8719 Personal history of other diseases of the digestive system: Secondary | ICD-10-CM | POA: Insufficient documentation

## 2012-10-25 DIAGNOSIS — Z87891 Personal history of nicotine dependence: Secondary | ICD-10-CM | POA: Insufficient documentation

## 2012-10-25 DIAGNOSIS — Z8679 Personal history of other diseases of the circulatory system: Secondary | ICD-10-CM | POA: Insufficient documentation

## 2012-10-25 DIAGNOSIS — Z79899 Other long term (current) drug therapy: Secondary | ICD-10-CM | POA: Insufficient documentation

## 2012-10-25 MED ORDER — AZITHROMYCIN 500 MG PO TABS: 500.0000 mg | ORAL_TABLET | Freq: Every day | ORAL | Status: DC

## 2012-10-25 MED ORDER — PROMETHAZINE-DM 6.25-15 MG/5ML PO SYRP: 5.0000 mL | ORAL_SOLUTION | Freq: Four times a day (QID) | ORAL | Status: DC | PRN

## 2012-10-25 MED ORDER — GUAIFENESIN ER 1200 MG PO TB12: 1.0000 | ORAL_TABLET | Freq: Two times a day (BID) | ORAL | Status: DC
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
## 2012-10-25 NOTE — ED Provider Notes (Signed)
Medical screening examination/treatment/procedure(s) were performed by non-physician practitioner and as supervising physician I was immediately available for consultation/collaboration.    Tajanae Guilbault L Laelia Angelo, MD 10/25/12 1523 

## 2012-10-25 NOTE — ED Notes (Signed)
Pt escorted to discharge window. Verbalized understanding discharge instructions. In no acute distress.   

## 2012-10-25 NOTE — ED Notes (Signed)
Patient transported to X-ray 

## 2012-10-25 NOTE — Progress Notes (Signed)
During [facility] ED 10/25/12 visit CM spoke with pt who confirms self pay [facility] resident with no pcp. Pt states she has recently obtained an orange card (eligibility) and has an appointment to be assigned a pcp soon.

## 2012-10-25 NOTE — ED Notes (Signed)
Pt complains of "sore throat, cough, fever, and loss of appetite x 2 weeks. Pt reports to being seen and finishing a coarse of antibiotics and prednisone.

## 2012-10-27 ENCOUNTER — Emergency Department (HOSPITAL_COMMUNITY): Payer: No Typology Code available for payment source

## 2012-10-27 ENCOUNTER — Emergency Department (HOSPITAL_COMMUNITY)
Admission: EM | Admit: 2012-10-27 | Discharge: 2012-10-27 | Disposition: A | Discharge status: Home / Self Care | Payer: No Typology Code available for payment source | Attending: Emergency Medicine | Admitting: Emergency Medicine

## 2012-10-27 ENCOUNTER — Encounter (HOSPITAL_COMMUNITY): Payer: Self-pay | Admitting: Emergency Medicine

## 2012-10-27 DIAGNOSIS — J329 Chronic sinusitis, unspecified: Secondary | ICD-10-CM

## 2012-10-27 DIAGNOSIS — J209 Acute bronchitis, unspecified: Secondary | ICD-10-CM | POA: Insufficient documentation

## 2012-10-27 DIAGNOSIS — R0789 Other chest pain: Secondary | ICD-10-CM | POA: Insufficient documentation

## 2012-10-27 DIAGNOSIS — IMO0001 Reserved for inherently not codable concepts without codable children: Secondary | ICD-10-CM | POA: Insufficient documentation

## 2012-10-27 DIAGNOSIS — R110 Nausea: Secondary | ICD-10-CM | POA: Insufficient documentation

## 2012-10-27 DIAGNOSIS — Z79899 Other long term (current) drug therapy: Secondary | ICD-10-CM | POA: Insufficient documentation

## 2012-10-27 DIAGNOSIS — Z8719 Personal history of other diseases of the digestive system: Secondary | ICD-10-CM | POA: Insufficient documentation

## 2012-10-27 DIAGNOSIS — H9209 Otalgia, unspecified ear: Secondary | ICD-10-CM | POA: Insufficient documentation

## 2012-10-27 DIAGNOSIS — J3489 Other specified disorders of nose and nasal sinuses: Secondary | ICD-10-CM | POA: Insufficient documentation

## 2012-10-27 DIAGNOSIS — R509 Fever, unspecified: Secondary | ICD-10-CM | POA: Insufficient documentation

## 2012-10-27 DIAGNOSIS — R5383 Other fatigue: Secondary | ICD-10-CM | POA: Insufficient documentation

## 2012-10-27 DIAGNOSIS — R05 Cough: Secondary | ICD-10-CM | POA: Insufficient documentation

## 2012-10-27 DIAGNOSIS — R51 Headache: Secondary | ICD-10-CM | POA: Insufficient documentation

## 2012-10-27 DIAGNOSIS — Z8679 Personal history of other diseases of the circulatory system: Secondary | ICD-10-CM | POA: Insufficient documentation

## 2012-10-27 DIAGNOSIS — R059 Cough, unspecified: Secondary | ICD-10-CM | POA: Insufficient documentation

## 2012-10-27 DIAGNOSIS — R5381 Other malaise: Secondary | ICD-10-CM | POA: Insufficient documentation

## 2012-10-27 DIAGNOSIS — J029 Acute pharyngitis, unspecified: Secondary | ICD-10-CM | POA: Insufficient documentation

## 2012-10-27 LAB — BASIC METABOLIC PANEL
Chloride: 103 mEq/L (ref 96–112)
GFR calc Af Amer: 88 mL/min — ABNORMAL LOW (ref >90)
GFR calc non Af Amer: 76 mL/min — ABNORMAL LOW (ref >90)
Potassium: 4.5 mEq/L (ref 3.5–5.1)
Sodium: 139 mEq/L (ref 135–145)

## 2012-10-27 LAB — CBC
MCHC: 32.8 g/dL (ref 30.0–36.0)
RDW: 13.3 % (ref 11.5–15.5)
WBC: 6.2 10*3/uL (ref 4.0–10.5)

## 2012-10-27 LAB — POCT I-STAT TROPONIN I: Troponin i, poc: 0.02 ng/mL (ref 0.00–0.08)

## 2012-10-27 NOTE — ED Notes (Signed)
During triage evaluation pt added to list of concerns her chest pain this am in addition to shortness of breath and cough. Bibasilar wheezing, posterior.

## 2012-10-27 NOTE — ED Notes (Signed)
Per PA pt allowed to have PO fluids 

## 2012-10-27 NOTE — ED Provider Notes (Signed)
Medical screening examination/treatment/procedure(s) were performed by non-physician practitioner and as supervising physician I was immediately available for consultation/collaboration.  Gilda Crease, MD 10/27/12 1150

## 2012-10-27 NOTE — ED Notes (Signed)
Pt reports that she has been feeling worse since Monday when she was treated for sinus infection. Reports nonradiating  chest pain and nausea after coughing this am.
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
## 2012-10-27 NOTE — ED Notes (Signed)
PA at bedside.

## 2012-10-27 NOTE — ED Notes (Signed)
Pt escorted to discharge window. Pt verbalized understanding discharge instructions. In no acute distress.  

## 2012-10-29 ENCOUNTER — Emergency Department (HOSPITAL_COMMUNITY)
Admission: EM | Admit: 2012-10-29 | Discharge: 2012-10-29 | Disposition: A | Discharge status: Home / Self Care | Payer: No Typology Code available for payment source | Source: Home / Self Care

## 2012-10-29 ENCOUNTER — Encounter (HOSPITAL_COMMUNITY): Payer: Self-pay

## 2012-10-29 DIAGNOSIS — R05 Cough: Secondary | ICD-10-CM

## 2012-10-29 MED ORDER — GUAIFENESIN-DM 100-10 MG/5ML PO SYRP: 5.0000 mL | ORAL_SOLUTION | Freq: Three times a day (TID) | ORAL | Status: DC | PRN

## 2012-10-29 MED ORDER — OMEPRAZOLE 40 MG PO CPDR: 40.0000 mg | DELAYED_RELEASE_CAPSULE | Freq: Every day | ORAL | Status: DC

## 2012-10-29 NOTE — ED Notes (Signed)
Patient complains of cough congestion increased phlem States she had 102 temp this am
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
## 2012-11-10 ENCOUNTER — Institutional Professional Consult (permissible substitution): Payer: No Typology Code available for payment source | Admitting: Internal Medicine

## 2012-12-04 ENCOUNTER — Encounter (HOSPITAL_COMMUNITY): Payer: Self-pay

## 2012-12-04 ENCOUNTER — Emergency Department (HOSPITAL_COMMUNITY)
Admission: EM | Admit: 2012-12-04 | Discharge: 2012-12-04 | Disposition: A | Discharge status: Home / Self Care | Payer: No Typology Code available for payment source | Attending: Emergency Medicine | Admitting: Emergency Medicine

## 2012-12-04 DIAGNOSIS — Z880 Allergy status to penicillin: Secondary | ICD-10-CM | POA: Insufficient documentation

## 2012-12-04 DIAGNOSIS — Y9389 Activity, other specified: Secondary | ICD-10-CM | POA: Insufficient documentation

## 2012-12-04 DIAGNOSIS — Y92009 Unspecified place in unspecified non-institutional (private) residence as the place of occurrence of the external cause: Secondary | ICD-10-CM | POA: Insufficient documentation

## 2012-12-04 DIAGNOSIS — Z79899 Other long term (current) drug therapy: Secondary | ICD-10-CM | POA: Insufficient documentation

## 2012-12-04 DIAGNOSIS — Z8719 Personal history of other diseases of the digestive system: Secondary | ICD-10-CM | POA: Insufficient documentation

## 2012-12-04 DIAGNOSIS — R110 Nausea: Secondary | ICD-10-CM | POA: Insufficient documentation

## 2012-12-04 DIAGNOSIS — Z8679 Personal history of other diseases of the circulatory system: Secondary | ICD-10-CM | POA: Insufficient documentation

## 2012-12-04 DIAGNOSIS — T43201A Poisoning by unspecified antidepressants, accidental (unintentional), initial encounter: Secondary | ICD-10-CM | POA: Insufficient documentation

## 2012-12-04 DIAGNOSIS — T43204A Poisoning by unspecified antidepressants, undetermined, initial encounter: Secondary | ICD-10-CM | POA: Insufficient documentation

## 2012-12-04 DIAGNOSIS — Z87891 Personal history of nicotine dependence: Secondary | ICD-10-CM | POA: Insufficient documentation

## 2012-12-04 DIAGNOSIS — T50901A Poisoning by unspecified drugs, medicaments and biological substances, accidental (unintentional), initial encounter: Secondary | ICD-10-CM

## 2012-12-04 MED ORDER — HYDROMORPHONE HCL PF 1 MG/ML IJ SOLN
0.5000 mg | Freq: Once | INTRAMUSCULAR | Status: AC
  Administered 2012-12-04: 0.5 mg via INTRAVENOUS
  Filled 2012-12-04: qty 1

## 2012-12-04 MED ORDER — SODIUM CHLORIDE 0.9 % IV BOLUS (SEPSIS)
1000.0000 mL | Freq: Once | INTRAVENOUS | Status: AC
  Administered 2012-12-04: 1000 mL via INTRAVENOUS

## 2012-12-04 MED ORDER — ONDANSETRON 4 MG PO TBDP
4.0000 mg | ORAL_TABLET | Freq: Once | ORAL | Status: AC
  Administered 2012-12-04: 4 mg via ORAL
  Filled 2012-12-04: qty 1
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
## 2012-12-04 NOTE — ED Provider Notes (Signed)
  Physical Exam  BP 128/70  Pulse 71  Temp(Src) 98 F (36.7 C)  Resp 16  SpO2 97%  Physical Exam Patient states she feels much better  ED Course  Procedures  MDM Will dc home       Arman Filter, NP 12/04/12 2109

## 2012-12-04 NOTE — ED Notes (Signed)
She states she took 4 of her 300mg  Wellbutrin tablets yesterday evening, thinking at the time that they were her Trazadone.  She states today her thinking is disorganized, but she is in no distress.  She also states she arose at her usual time and was able to prepare her own breakfast; and then went shopping.

## 2012-12-04 NOTE — Discharge Instructions (Signed)
Accidental Overdose °A drug overdose occurs when a chemical substance (drug or medication) is used in amounts large enough to overcome a person. This may result in severe illness or death. This is a type of poisoning. Accidental overdoses of medications or other substances come from a variety of reasons. When this happens accidentally, it is often because the person taking the substance does not know enough about what they have taken. Drugs which commonly cause overdose deaths are alcohol, psychotropic medications (medications which affect the mind), pain medications, illegal drugs (street drugs) such as cocaine and heroin, and multiple drugs taken at the same time. It may result from careless behavior (such as over-indulging at a party). Other causes of overdose may include multiple drug use, a lapse in memory, or drug use after a period of no drug use.  °Sometimes overdosing occurs because a person cannot remember if they have taken their medication.  °A common unintentional overdose in young children involves multi-vitamins containing iron. Iron is a part of the hemoglobin molecule in blood. It is used to transport oxygen to living cells. When taken in small amounts, iron allows the body to restock hemoglobin. In large amounts, it causes problems in the body. If this overdose is not treated, it can lead to death. °Never take medicines that show signs of tampering or do not seem quite right. Never take medicines in the dark or in poor lighting. Read the label and check each dose of medicine before you take it. When adults are poisoned, it happens most often through carelessness or lack of information. Taking medicines in the dark or taking medicine prescribed for someone else to treat the same type of problem is a dangerous practice. °SYMPTOMS  °Symptoms of overdose depend on the medication and amount taken. They can vary from over-activity with stimulant over-dosage, to sleepiness from depressants such as  alcohol, narcotics and tranquilizers. Confusion, dizziness, nausea and vomiting may be present. If problems are severe enough coma and death may result. °DIAGNOSIS  °Diagnosis and management are generally straightforward if the drug is known. Otherwise it is more difficult. At times, certain symptoms and signs exhibited by the patient, or blood tests, can reveal the drug in question.  °TREATMENT  °In an emergency department, most patients can be treated with supportive measures. Antidotes may be available if there has been an overdose of opioids or benzodiazepines. A rapid improvement will often occur if this is the cause of overdose. °At home or away from medical care: °· There may be no immediate problems or warning signs in children. °· Not everything works well in all cases of poisoning. °· Take immediate action. Poisons may act quickly. °· If you think someone has swallowed medicine or a household product, and the person is unconscious, having seizures (convulsions), or is not breathing, immediately call for an ambulance. °IF a person is conscious and appears to be doing OK but has swallowed a poison: °· Do not wait to see what effect the poison will have. Immediately call a poison control center (listed in the white pages of your telephone book under "Poison Control" or inside the front cover with other emergency numbers). Some poison control centers have TTY capability for the deaf. Check with your local center if you or someone in your family requires this service. °· Keep the container so you can read the label on the product for ingredients. °· Describe what, when, and how much was taken and the age and condition of the person poisoned.   Describe what, when, and how much was taken and the age and condition of the person poisoned. Inform them if the person is vomiting, choking, drowsy, shows a change in color or temperature of skin, is conscious or unconscious, or is convulsing.   Do not cause vomiting unless instructed by medical personnel. Do not induce vomiting or force liquids into a person who  is convulsing, unconscious, or very drowsy.  Stay calm and in control.    Activated charcoal also is sometimes used in certain types of poisoning and you may wish to add a supply to your emergency medicines. It is available without a prescription. Call a poison control center before using this medication.  PREVENTION   Thousands of children die every year from unintentional poisoning. This may be from household chemicals, poisoning from carbon monoxide in a car, taking their parent's medications, or simply taking a few iron pills or vitamins with iron. Poisoning comes from unexpected sources.   Store medicines out of the sight and reach of children, preferably in a locked cabinet. Do not keep medications in a food cabinet. Always store your medicines in a secure place. Get rid of expired medications.   If you have children living with you or have them as occasional guests, you should have child-resistant caps on your medicine containers. Keep everything out of reach. Child proof your home.   If you are called to the telephone or to answer the door while you are taking a medicine, take the container with you or put the medicine out of the reach of small children.   Do not take your medication in front of children. Do not tell your child how good a medication is and how good it is for them. They may get the idea it is more of a treat.   If you are an adult and have accidentally taken an overdose, you need to consider how this happened and what can be done to prevent it from happening again. If this was from a street drug or alcohol, determine if there is a problem that needs addressing. If you are not sure a problems exists, it is easy to talk to a professional and ask them if they think you have a problem. It is better to handle this problem in this way before it happens again and has a much worse consequence.  Document Released: 09/27/2004 Document Revised: 10/06/2011 Document Reviewed: 03/05/2009  [employer]  Patient Information 2013 [employer].

## 2012-12-05 NOTE — ED Provider Notes (Signed)
Medical screening examination/treatment/procedure(s) were performed by non-physician practitioner and as supervising physician I was immediately available for consultation/collaboration.  Rashee Marschall, MD 12/05/12 1542 

## 2012-12-05 NOTE — ED Provider Notes (Signed)
Medical screening examination/treatment/procedure(s) were performed by non-physician practitioner and as supervising physician I was immediately available for consultation/collaboration.  Raeford Razor, MD 12/05/12 1544

## 2012-12-27 ENCOUNTER — Encounter: Payer: Self-pay | Admitting: Internal Medicine

## 2012-12-27 ENCOUNTER — Ambulatory Visit: Payer: No Typology Code available for payment source | Attending: Family Medicine | Admitting: Internal Medicine

## 2012-12-27 VITALS — BP 134/78 | HR 54 | Temp 98.7°F | Resp 16 | Ht 65.0 in | Wt 164.0 lb

## 2012-12-27 DIAGNOSIS — F411 Generalized anxiety disorder: Secondary | ICD-10-CM | POA: Insufficient documentation

## 2012-12-27 DIAGNOSIS — R51 Headache: Secondary | ICD-10-CM | POA: Insufficient documentation

## 2012-12-27 DIAGNOSIS — F3289 Other specified depressive episodes: Secondary | ICD-10-CM | POA: Insufficient documentation

## 2012-12-27 DIAGNOSIS — F329 Major depressive disorder, single episode, unspecified: Secondary | ICD-10-CM

## 2012-12-27 DIAGNOSIS — G43009 Migraine without aura, not intractable, without status migrainosus: Secondary | ICD-10-CM

## 2012-12-27 MED ORDER — HYDROCODONE-ACETAMINOPHEN 5-325 MG PO TABS: 1.0000 | ORAL_TABLET | Freq: Four times a day (QID) | ORAL | Status: DC | PRN

## 2012-12-27 NOTE — Progress Notes (Signed)
Patient complains of headaches since Thursday; throbbing sensation in right temple; sates nausea.

## 2012-12-27 NOTE — Progress Notes (Signed)
Patient ID: [name], female   DOB: 22-Aug-1950, 62 y.o.   MRN: [id number] Patient Demographics  Dorothy White, is a 62 y.o. female  WUJ:[id number]  [id number]  DOB - 12/19/1950  Chief Complaint  Patient presents with  . Headache    Patient states headaches since thursday.        Subjective:   [name] today is here for a follow up visit. Patient has a past medical history of major depression, anxiety and also migraine headaches. She was free of her migraine headaches for the past number of years, previously she was on Zomig for abortive therapy. Since last Thursday she started having headaches, she is currently taking ibuprofen 800 mg 4 times a day with only some mild relief. She claims to have associated photophobia and phonophobia. No fever neck pain. She claims that this current headache is not very typical for her usual migraine headaches.  Patient has  No chest pain, No abdominal pain - No Nausea, No new weakness tingling or numbness, No Cough - SOB.   Objective:    Filed Vitals:   12/27/12 1143  BP: 134/78  Pulse: 54  Temp: 98.7 F (37.1 C)  TempSrc: Oral  Resp: 16  Height: 5\' 5"  (1.651 m)  Weight: 164 lb (74.39 kg)  SpO2: 97%     ALLERGIES:   Allergies  Allergen Reactions  . Penicillins     REACTION: hives    PAST MEDICAL HISTORY: Past Medical History  Diagnosis Date  . Arrhythmia   . Diverticulitis     MEDICATIONS AT HOME: Prior to Admission medications   Medication Sig Start Date End Date Taking? Authorizing Provider  buPROPion (WELLBUTRIN XL) 300 MG 24 hr tablet Take 300 mg by mouth daily.   Yes Historical Provider, MD  cetirizine (ZYRTEC) 10 MG tablet Take 1 tablet (10 mg total) by mouth daily. 09/14/12  Yes Clanford Cyndie Mull, MD  FLUoxetine (PROZAC) 20 MG capsule Take 60 mg by mouth daily. 3 capsules for 60 mg dose   Yes Historical Provider, MD  traZODone (DESYREL) 100 MG tablet Take 200 mg by mouth at bedtime.    Yes  Historical Provider, MD  triamcinolone (NASACORT) 55 MCG/ACT nasal inhaler Place 2 sprays into the nose daily.   Yes Historical Provider, MD     Exam  General appearance :Awake, alert, not in any distress. Speech Clear. Not toxic Looking HEENT: Atraumatic and Normocephalic, pupils equally reactive to light and accomodation Neck: supple, no JVD. No cervical lymphadenopathy.  Chest:Good air entry bilaterally, no added sounds  CVS: S1 S2 regular, no murmurs.  Abdomen: Bowel sounds present, Non tender and not distended with no gaurding, rigidity or rebound. Extremities: B/L Lower Ext shows no edema, both legs are warm to touch Neurology: Awake alert, and oriented X 3, CN II-XII intact, Non focal Skin:No Rash Wounds:N/A    Data Review   CBC No results found for this basename: WBC, HGB, HCT, PLT, MCV, MCH, MCHC, RDW, NEUTRABS, LYMPHSABS, MONOABS, EOSABS, BASOSABS, BANDABS, BANDSABD,  in the last 168 hours  Chemistries   No results found for this basename: NA, K, CL, CO2, GLUCOSE, BUN, CREATININE, GFRCGP, CALCIUM, MG, AST, ALT, ALKPHOS, BILITOT,  in the last 168 hours ------------------------------------------------------------------------------------------------------------------ No results found for this basename: HGBA1C,  in the last 72 hours ------------------------------------------------------------------------------------------------------------------ No results found for this basename: CHOL, HDL, LDLCALC, TRIG, CHOLHDL, LDLDIRECT,  in the last 72 hours ------------------------------------------------------------------------------------------------------------------ No results found for this basename: TSH, T4TOTAL, FREET3, T3FREE, THYROIDAB,  in the last 72 hours ------------------------------------------------------------------------------------------------------------------ No results found for this basename: VITAMINB12, FOLATE, FERRITIN, TIBC, IRON, RETICCTPCT,  in the last 72  hours  Coagulation profile  No results found for this basename: INR, PROTIME,  in the last 168 hours    Assessment & Plan  Headache -?etiology -she is not sure if she can afford Zomig-and would like to try Hydrocodone-as it has worked in the past, Since this has just recurred after a number of years-she is not keen on prophylactic therapy at this time.She claims Toradol does not help in this situation.Will give her 15 tablets of Vicodin to see if this helps, if not-she has been advised to call the clinic for a follow up appointment for further work up and management.  Depression -stable  -she see's Psych NP/MD at [facility]  Anxiety - Patient is taking Clonidine for anxiety-I asked her whether she meant Klonopin-but she is insistent that it is clonidine-I've asked her to stop this and call her primary psychiatrist at [facility] and clarify as to whether she should be taking this.She claims understanding  Check stool for FOBT- if positive possible GI referral for Colonoscopy  Refer to GYN for pap smear  Follow up in 1 month

## 2013-01-27 ENCOUNTER — Ambulatory Visit: Payer: No Typology Code available for payment source | Attending: Family Medicine | Admitting: Internal Medicine

## 2013-01-27 MED ORDER — ESTROGENS, CONJUGATED 0.625 MG/GM VA CREA: TOPICAL_CREAM | Freq: Every day | VAGINAL | Status: DC

## 2013-01-27 MED ORDER — MONTELUKAST SODIUM 10 MG PO TABS: 10.0000 mg | ORAL_TABLET | Freq: Every day | ORAL | Status: DC

## 2013-01-27 NOTE — Progress Notes (Unsigned)
Patient ID: Dorothy White, female   DOB: 12-26-1950, 62 y.o.   MRN: [id number]  CC:  HPI:  62 year old female who presents to the clinic for a followup. She states that her headaches have improved and she not had any more last several weeks. She reports some sinus congestion, postnasal dripping, chronic cough for several months. Which is nonproductive. She had a chest x-ray in February which was negative. The patient recently got into a new relationship and has experienced some vaginal dryness. And she is requesting an estrogen cream     Allergies  Allergen Reactions  . Penicillins     REACTION: hives   Past Medical History  Diagnosis Date  . Arrhythmia   . Diverticulitis    Current Outpatient Prescriptions on File Prior to Visit  Medication Sig Dispense Refill  . buPROPion (WELLBUTRIN XL) 300 MG 24 hr tablet Take 300 mg by mouth daily.      . cetirizine (ZYRTEC) 10 MG tablet Take 1 tablet (10 mg total) by mouth daily.  30 tablet  1  . FLUoxetine (PROZAC) 20 MG capsule Take 60 mg by mouth daily. 3 capsules for 60 mg dose      . HYDROcodone-acetaminophen (NORCO/VICODIN) 5-325 MG per tablet Take 1 tablet by mouth every 6 (six) hours as needed for pain.  15 tablet  0  . traZODone (DESYREL) 100 MG tablet Take 200 mg by mouth at bedtime.       . triamcinolone (NASACORT) 55 MCG/ACT nasal inhaler Place 2 sprays into the nose daily.      . [DISCONTINUED] Calcium Carbonate-Vitamin D (CALCIUM + D) 600-200 MG-UNIT TABS Take 1 tablet by mouth daily.         No current facility-administered medications on file prior to visit.   Family History  Problem Relation Age of Onset  . Cancer Mother   . Heart failure Father   . Hypertension Brother    History   Social History  . Marital Status: Divorced    Spouse Name: N/A    Number of Children: N/A  . Years of Education: N/A   Occupational History  . Not on file.   Social History Main Topics  . Smoking status: Former Smoker    Quit  date: 06/30/2011  . Smokeless tobacco: Never Used  . Alcohol Use: No  . Drug Use: No  . Sexually Active: No   Other Topics Concern  . Not on file   Social History Narrative  . No narrative on file    Review of Systems  Constitutional: Negative for fever, chills, diaphoresis, activity change, appetite change and fatigue.  HENT: Negative for ear pain, nosebleeds, congestion, facial swelling, rhinorrhea, neck pain, neck stiffness and ear discharge.   Eyes: Negative for pain, discharge, redness, itching and visual disturbance.  Respiratory: Negative for cough, choking, chest tightness, shortness of breath, wheezing and stridor.   Cardiovascular: Negative for chest pain, palpitations and leg swelling.  Gastrointestinal: Negative for abdominal distention.  Genitourinary: Negative for dysuria, urgency, frequency, hematuria, flank pain, decreased urine volume, difficulty urinating and dyspareunia.  Musculoskeletal: Negative for back pain, joint swelling, arthralgias and gait problem.  Neurological: Negative for dizziness, tremors, seizures, syncope, facial asymmetry, speech difficulty, weakness, light-headedness, numbness and headaches.  Hematological: Negative for adenopathy. Does not bruise/bleed easily.  Psychiatric/Behavioral: Negative for hallucinations, behavioral problems, confusion, dysphoric mood, decreased concentration and agitation.    Objective:   Filed Vitals:   01/27/13 0902  BP: 119/76  Pulse: 66  Temp:  98.4 F (36.9 C)  Resp: 18    Physical Exam  Constitutional: Appears well-developed and well-nourished. No distress.  HENT: Normocephalic. External right and left ear normal. Oropharynx is clear and moist.  Eyes: Conjunctivae and EOM are normal. PERRLA, no scleral icterus.  Neck: Normal ROM. Neck supple. No JVD. No tracheal deviation. No thyromegaly.  CVS: RRR, S1/S2 +, no murmurs, no gallops, no carotid bruit.  Pulmonary: Effort and breath sounds normal, no  stridor, rhonchi, wheezes, rales.  Abdominal: Soft. BS +,  no distension, tenderness, rebound or guarding.  Musculoskeletal: Normal range of motion. No edema and no tenderness.  Lymphadenopathy: No lymphadenopathy noted, cervical, inguinal. Neuro: Alert. Normal reflexes, muscle tone coordination. No cranial nerve deficit. Skin: Skin is warm and dry. No rash noted. Not diaphoretic. No erythema. No pallor.  Psychiatric: Normal mood and affect. Behavior, judgment, thought content normal.   Lab Results  Component Value Date   WBC 6.2 10/27/2012   HGB 14.7 10/27/2012   HCT 44.8 10/27/2012   MCV 87.8 10/27/2012   PLT 196 10/27/2012   Lab Results  Component Value Date   CREATININE 0.82 10/27/2012   BUN 10 10/27/2012   NA 139 10/27/2012   K 4.5 10/27/2012   CL 103 10/27/2012   CO2 25 10/27/2012    No results found for this basename: HGBA1C   Lipid Panel     Component Value Date/Time   CHOL 143 04/13/2007 1417   TRIG 161* 04/13/2007 1417   HDL 39.6 04/13/2007 1417   CHOLHDL 3.6 CALC 04/13/2007 1417   VLDL 32 04/13/2007 1417   LDLCALC 71 04/13/2007 1417       Assessment and plan:   Patient Active Problem List   Diagnosis Date Noted  . HYPERLIPIDEMIA 05/07/2007  . COMMON MIGRAINE 05/07/2007  . PREMATURE VENTRICULAR CONTRACTIONS, FREQUENT 05/07/2007  . GERD 05/07/2007  . DIVERTICULOSIS, COLON 05/07/2007  . 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 OSTEOPENIA 05/07/2007  . ANXIETY 03/24/2007  . DEPRESSION 03/24/2007  . HEMORRHOIDS 03/24/2007  . ALLERGIC RHINITIS 03/24/2007  . LOW BACK PAIN 03/24/2007  . MIGRAINES, HX OF 03/24/2007        Assessment plan #1 vaginal dryness Premarin estrogen cream provided The patient has also been advised to use a lubricant  #2 headaches improved, no refills and Vicodin  #3 allergic rhinitis. The patient uses Nasacort. She has been prescribed Singulair  Routine followup in 2 months

## 2013-01-27 NOTE — Progress Notes (Unsigned)
Patient here for follow up and states needs physical

## 2013-01-31 ENCOUNTER — Other Ambulatory Visit: Payer: Self-pay | Admitting: Internal Medicine

## 2013-02-08 ENCOUNTER — Encounter (HOSPITAL_COMMUNITY): Payer: Self-pay | Admitting: *Deleted

## 2013-02-08 ENCOUNTER — Emergency Department (HOSPITAL_COMMUNITY)
Admission: EM | Admit: 2013-02-08 | Discharge: 2013-02-08 | Disposition: A | Discharge status: Home / Self Care | Payer: No Typology Code available for payment source | Source: Home / Self Care | Attending: Family Medicine | Admitting: Family Medicine

## 2013-02-08 DIAGNOSIS — M76899 Other specified enthesopathies of unspecified lower limb, excluding foot: Secondary | ICD-10-CM

## 2013-02-08 DIAGNOSIS — M7062 Trochanteric bursitis, left hip: Secondary | ICD-10-CM

## 2013-02-08 MED ORDER — TETANUS-DIPHTH-ACELL PERTUSSIS 5-2.5-18.5 LF-MCG/0.5 IM SUSP
0.5000 mL | Freq: Once | INTRAMUSCULAR | Status: AC
  Administered 2013-02-08: 0.5 mL via INTRAMUSCULAR

## 2013-02-08 MED ORDER — DICLOFENAC POTASSIUM 50 MG PO TABS: 50.0000 mg | ORAL_TABLET | Freq: Three times a day (TID) | ORAL | Status: DC

## 2013-02-08 MED ORDER — TETANUS-DIPHTH-ACELL PERTUSSIS 5-2.5-18.5 LF-MCG/0.5 IM SUSP
INTRAMUSCULAR | Status: AC
  Filled 2013-02-08: qty 0.5

## 2013-02-08 NOTE — ED Notes (Signed)
Pt  Reports  Recently        Did  Some    Sports  activitys          And  Subsequently       Developed       Pain  Radiating  Down        l  Leg              No  Loss  Of  Bowel  /  Bladder  Control
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
## 2013-02-09 ENCOUNTER — Encounter: Payer: Self-pay | Admitting: Family Medicine

## 2013-02-20 IMAGING — CR DG FOOT COMPLETE 3+V*L*
3 series · 3 of 3 positions shown · non-contrast
Comparison: None.

CLINICAL DATA: Pain around fourth and fifth metatarsals

LEFT FOOT - COMPLETE 3+ VIEW

[t foot ap left]
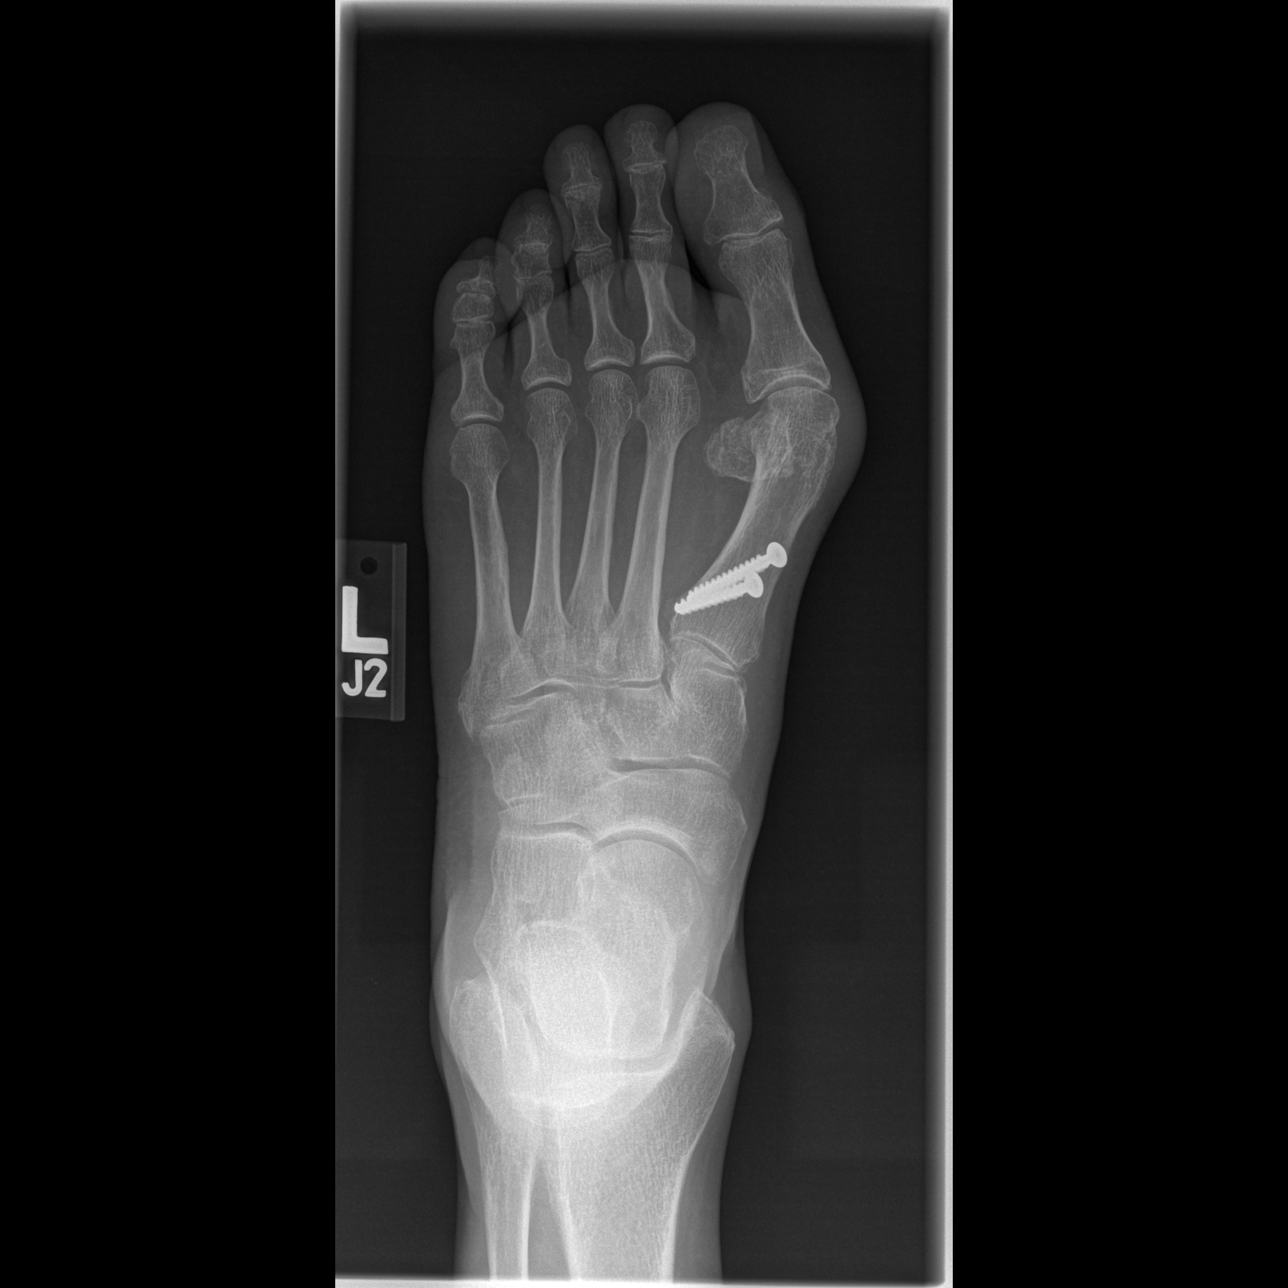

[t foot oblique left]
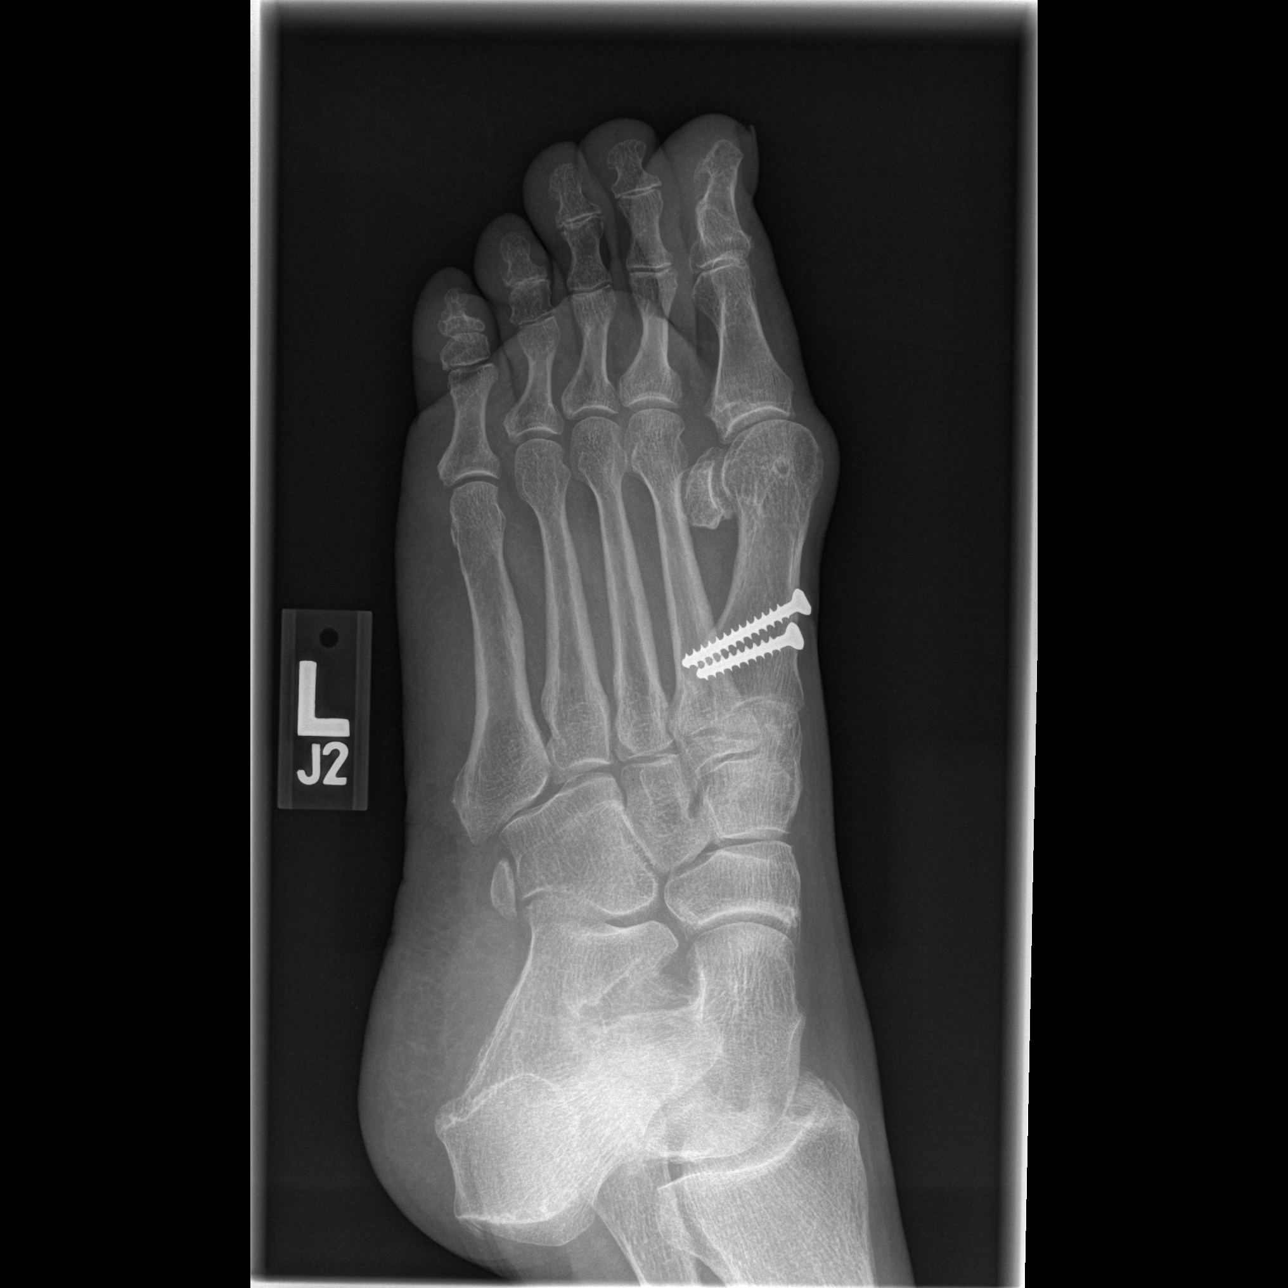

[t foot lat left]
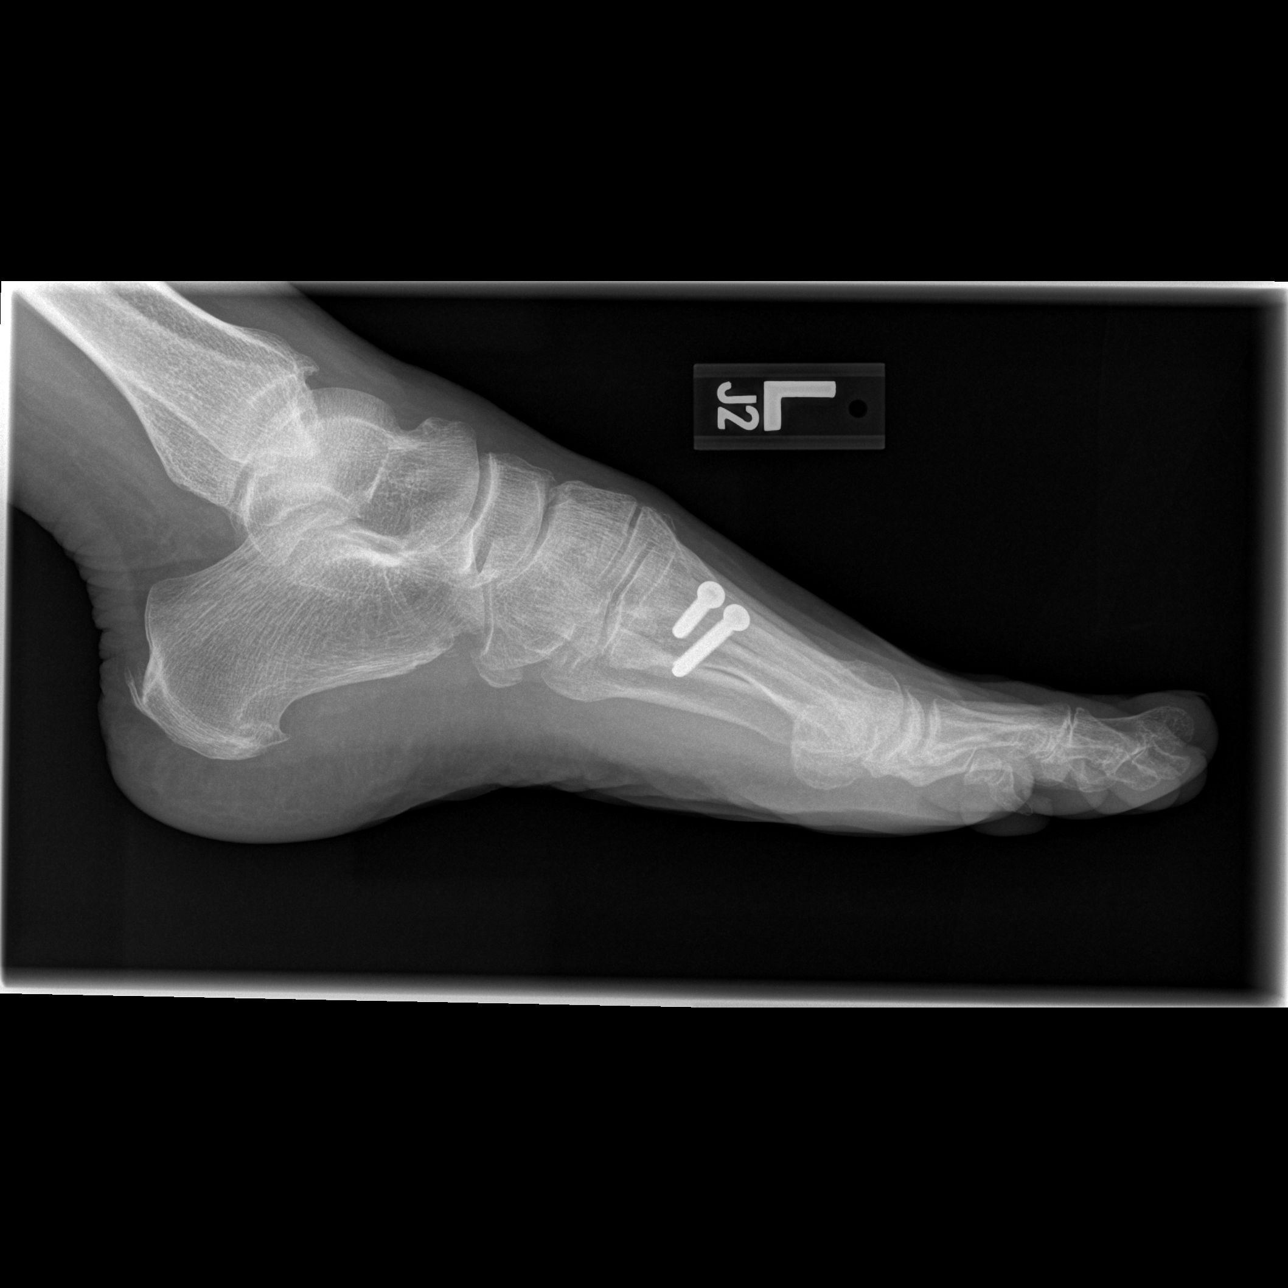

[3 of 3 positions shown; findings below may reference images not displayed]

FINDINGS: There is hallux valgus deformity.  Surgical clips are
present at the first TMT joint.  There is no evidence of fracture
or dislocation.  There is no periosteal reaction or cortical
destruction.  Posterior and plantar calcaneal spurs are present.
IMPRESSION: Hallux valgus deformity.

No evidence of acute fracture or dislocation.

## 2013-04-07 ENCOUNTER — Emergency Department (INDEPENDENT_AMBULATORY_CARE_PROVIDER_SITE_OTHER)
Admission: EM | Admit: 2013-04-07 | Discharge: 2013-04-07 | Disposition: A | Discharge status: Home / Self Care | Payer: Self-pay | Source: Home / Self Care

## 2013-04-07 ENCOUNTER — Emergency Department (INDEPENDENT_AMBULATORY_CARE_PROVIDER_SITE_OTHER): Payer: Self-pay

## 2013-04-07 ENCOUNTER — Encounter (HOSPITAL_COMMUNITY): Payer: Self-pay | Admitting: Emergency Medicine

## 2013-04-07 DIAGNOSIS — S92911A Unspecified fracture of right toe(s), initial encounter for closed fracture: Secondary | ICD-10-CM

## 2013-04-07 DIAGNOSIS — S92919A Unspecified fracture of unspecified toe(s), initial encounter for closed fracture: Secondary | ICD-10-CM

## 2013-04-07 DIAGNOSIS — M76899 Other specified enthesopathies of unspecified lower limb, excluding foot: Secondary | ICD-10-CM
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
## 2013-04-07 NOTE — ED Provider Notes (Signed)
Medical screening examination/treatment/procedure(s) were performed by non-physician practitioner and as supervising physician I was immediately available for consultation/collaboration.  Arbie Blankley, M.D.  Aunna Snooks C Arlita Buffkin, MD 04/07/13 1927 
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
## 2013-04-11 ENCOUNTER — Encounter (HOSPITAL_COMMUNITY): Payer: Self-pay | Admitting: *Deleted

## 2013-04-11 ENCOUNTER — Emergency Department (HOSPITAL_COMMUNITY): Payer: Self-pay

## 2013-04-11 ENCOUNTER — Emergency Department (HOSPITAL_COMMUNITY)
Admission: EM | Admit: 2013-04-11 | Discharge: 2013-04-11 | Disposition: A | Discharge status: Home / Self Care | Payer: Self-pay | Attending: Emergency Medicine | Admitting: Emergency Medicine

## 2013-04-11 DIAGNOSIS — R0789 Other chest pain: Secondary | ICD-10-CM

## 2013-04-11 DIAGNOSIS — Z880 Allergy status to penicillin: Secondary | ICD-10-CM | POA: Insufficient documentation

## 2013-04-11 DIAGNOSIS — Z8719 Personal history of other diseases of the digestive system: Secondary | ICD-10-CM | POA: Insufficient documentation

## 2013-04-11 DIAGNOSIS — Z79899 Other long term (current) drug therapy: Secondary | ICD-10-CM | POA: Insufficient documentation

## 2013-04-11 DIAGNOSIS — R110 Nausea: Secondary | ICD-10-CM | POA: Insufficient documentation

## 2013-04-11 DIAGNOSIS — Z87891 Personal history of nicotine dependence: Secondary | ICD-10-CM | POA: Insufficient documentation

## 2013-04-11 DIAGNOSIS — Z8679 Personal history of other diseases of the circulatory system: Secondary | ICD-10-CM | POA: Insufficient documentation

## 2013-04-11 DIAGNOSIS — R071 Chest pain on breathing: Secondary | ICD-10-CM | POA: Insufficient documentation

## 2013-04-11 LAB — CBC WITH DIFFERENTIAL/PLATELET
Basophils Absolute: 0 10*3/uL (ref 0.0–0.1)
Lymphocytes Relative: 31 % (ref 12–46)
Lymphs Abs: 1.7 10*3/uL (ref 0.7–4.0)
Neutro Abs: 3.1 10*3/uL (ref 1.7–7.7)
Neutrophils Relative %: 56 % (ref 43–77)
Platelets: 237 10*3/uL (ref 150–400)
RBC: 4.93 MIL/uL (ref 3.87–5.11)
RDW: 13.7 % (ref 11.5–15.5)
WBC: 5.6 10*3/uL (ref 4.0–10.5)

## 2013-04-11 LAB — LIPASE, BLOOD: Lipase: 16 U/L (ref 11–59)

## 2013-04-11 LAB — URINALYSIS, ROUTINE W REFLEX MICROSCOPIC
Ketones, ur: NEGATIVE mg/dL
Leukocytes, UA: NEGATIVE
Nitrite: NEGATIVE
Protein, ur: NEGATIVE mg/dL
Urobilinogen, UA: 0.2 mg/dL (ref 0.0–1.0)

## 2013-04-11 LAB — COMPREHENSIVE METABOLIC PANEL
ALT: 13 U/L (ref 0–35)
AST: 17 U/L (ref 0–37)
Alkaline Phosphatase: 106 U/L (ref 39–117)
CO2: 25 mEq/L (ref 19–32)
Calcium: 9.4 mg/dL (ref 8.4–10.5)
Chloride: 104 mEq/L (ref 96–112)
GFR calc non Af Amer: 78 mL/min — ABNORMAL LOW (ref >90)
Potassium: 4.5 mEq/L (ref 3.5–5.1)
Sodium: 139 mEq/L (ref 135–145)
Total Bilirubin: 0.5 mg/dL (ref 0.3–1.2)

## 2013-04-11 MED ORDER — HYDROCODONE-ACETAMINOPHEN 5-325 MG PO TABS: 1.0000 | ORAL_TABLET | ORAL | Status: DC | PRN

## 2013-04-11 MED ORDER — ONDANSETRON HCL 4 MG/2ML IJ SOLN
4.0000 mg | Freq: Once | INTRAMUSCULAR | Status: AC
  Administered 2013-04-11: 4 mg via INTRAVENOUS
  Filled 2013-04-11: qty 2

## 2013-04-11 MED ORDER — HYDROMORPHONE HCL PF 1 MG/ML IJ SOLN
0.5000 mg | Freq: Once | INTRAMUSCULAR | Status: AC
  Administered 2013-04-11: 0.5 mg via INTRAVENOUS
  Filled 2013-04-11: qty 1
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
## 2013-04-11 NOTE — ED Notes (Signed)
Pt reports right sided flank pain x3 days, pain has increased. Pain 7/10. Reports nausea. Denies dysuria or hx of kidney stones.

## 2013-06-02 ENCOUNTER — Other Ambulatory Visit: Payer: Self-pay

## 2013-06-02 ENCOUNTER — Encounter: Payer: Self-pay | Admitting: Internal Medicine

## 2013-06-05 ENCOUNTER — Emergency Department (HOSPITAL_COMMUNITY): Payer: Self-pay

## 2013-06-05 ENCOUNTER — Emergency Department (HOSPITAL_COMMUNITY)
Admission: EM | Admit: 2013-06-05 | Discharge: 2013-06-05 | Disposition: A | Discharge status: Home / Self Care | Payer: Self-pay | Attending: Emergency Medicine | Admitting: Emergency Medicine

## 2013-06-05 ENCOUNTER — Encounter (HOSPITAL_COMMUNITY): Payer: Self-pay | Admitting: Emergency Medicine

## 2013-06-05 DIAGNOSIS — Y9389 Activity, other specified: Secondary | ICD-10-CM | POA: Insufficient documentation

## 2013-06-05 DIAGNOSIS — Y929 Unspecified place or not applicable: Secondary | ICD-10-CM | POA: Insufficient documentation

## 2013-06-05 DIAGNOSIS — Z880 Allergy status to penicillin: Secondary | ICD-10-CM | POA: Insufficient documentation

## 2013-06-05 DIAGNOSIS — Z8679 Personal history of other diseases of the circulatory system: Secondary | ICD-10-CM | POA: Insufficient documentation

## 2013-06-05 DIAGNOSIS — W230XXA Caught, crushed, jammed, or pinched between moving objects, initial encounter: Secondary | ICD-10-CM | POA: Insufficient documentation

## 2013-06-05 DIAGNOSIS — S6390XA Sprain of unspecified part of unspecified wrist and hand, initial encounter: Secondary | ICD-10-CM | POA: Insufficient documentation

## 2013-06-05 DIAGNOSIS — Z79899 Other long term (current) drug therapy: Secondary | ICD-10-CM | POA: Insufficient documentation

## 2013-06-05 DIAGNOSIS — Z8719 Personal history of other diseases of the digestive system: Secondary | ICD-10-CM | POA: Insufficient documentation

## 2013-06-05 DIAGNOSIS — Z87891 Personal history of nicotine dependence: Secondary | ICD-10-CM | POA: Insufficient documentation

## 2013-06-05 DIAGNOSIS — S63619A Unspecified sprain of unspecified finger, initial encounter: Secondary | ICD-10-CM

## 2013-06-05 MED ORDER — IBUPROFEN 800 MG PO TABS: 800.0000 mg | ORAL_TABLET | Freq: Three times a day (TID) | ORAL | Status: DC | PRN

## 2013-06-05 MED ORDER — IBUPROFEN 800 MG PO TABS: 800.0000 mg | ORAL_TABLET | Freq: Once | ORAL | Status: DC

## 2013-06-05 MED ORDER — HYDROCODONE-ACETAMINOPHEN 5-325 MG PO TABS: 1.0000 | ORAL_TABLET | Freq: Four times a day (QID) | ORAL | Status: DC | PRN

## 2013-06-05 NOTE — ED Provider Notes (Signed)
Medical screening examination/treatment/procedure(s) were performed by non-physician practitioner and as supervising physician I was immediately available for consultation/collaboration.  EKG Interpretation   None         Lyanne Co, MD 06/05/13 [phone]

## 2013-06-05 NOTE — ED Notes (Signed)
Pt complain of left pinky numbness and pain. Pt states golden retriever jumped up and jammed her pinky inward. Pt stated it hurt last night but this AM it has become swollen, numb and she is unable to bend it.
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
## 2013-06-27 DIAGNOSIS — I639 Cerebral infarction, unspecified: Secondary | ICD-10-CM

## 2013-06-27 HISTORY — DX: Cerebral infarction, unspecified: I63.9

## 2013-07-04 ENCOUNTER — Emergency Department (HOSPITAL_COMMUNITY): Payer: Self-pay

## 2013-07-04 ENCOUNTER — Emergency Department (HOSPITAL_COMMUNITY)
Admission: EM | Admit: 2013-07-04 | Discharge: 2013-07-04 | Disposition: A | Discharge status: Home / Self Care | Payer: Self-pay | Attending: Emergency Medicine | Admitting: Emergency Medicine

## 2013-07-04 ENCOUNTER — Encounter (HOSPITAL_COMMUNITY): Payer: Self-pay | Admitting: Emergency Medicine

## 2013-07-04 DIAGNOSIS — R079 Chest pain, unspecified: Secondary | ICD-10-CM | POA: Insufficient documentation

## 2013-07-04 DIAGNOSIS — R413 Other amnesia: Secondary | ICD-10-CM | POA: Insufficient documentation

## 2013-07-04 DIAGNOSIS — Z87891 Personal history of nicotine dependence: Secondary | ICD-10-CM | POA: Insufficient documentation

## 2013-07-04 DIAGNOSIS — F329 Major depressive disorder, single episode, unspecified: Secondary | ICD-10-CM | POA: Insufficient documentation

## 2013-07-04 DIAGNOSIS — Z79899 Other long term (current) drug therapy: Secondary | ICD-10-CM | POA: Insufficient documentation

## 2013-07-04 DIAGNOSIS — Z8719 Personal history of other diseases of the digestive system: Secondary | ICD-10-CM | POA: Insufficient documentation

## 2013-07-04 DIAGNOSIS — Z8679 Personal history of other diseases of the circulatory system: Secondary | ICD-10-CM | POA: Insufficient documentation

## 2013-07-04 DIAGNOSIS — Z880 Allergy status to penicillin: Secondary | ICD-10-CM | POA: Insufficient documentation

## 2013-07-04 DIAGNOSIS — N644 Mastodynia: Secondary | ICD-10-CM | POA: Insufficient documentation

## 2013-07-04 DIAGNOSIS — E860 Dehydration: Secondary | ICD-10-CM | POA: Insufficient documentation

## 2013-07-04 DIAGNOSIS — F3289 Other specified depressive episodes: Secondary | ICD-10-CM | POA: Insufficient documentation

## 2013-07-04 DIAGNOSIS — E1149 Type 2 diabetes mellitus with other diabetic neurological complication: Secondary | ICD-10-CM | POA: Insufficient documentation

## 2013-07-04 DIAGNOSIS — E162 Hypoglycemia, unspecified: Secondary | ICD-10-CM

## 2013-07-04 DIAGNOSIS — F29 Unspecified psychosis not due to a substance or known physiological condition: Secondary | ICD-10-CM | POA: Insufficient documentation

## 2013-07-04 DIAGNOSIS — R42 Dizziness and giddiness: Secondary | ICD-10-CM | POA: Insufficient documentation

## 2013-07-04 HISTORY — DX: Depression, unspecified: F32.A

## 2013-07-04 HISTORY — DX: Major depressive disorder, single episode, unspecified: F32.9

## 2013-07-04 LAB — CBC WITH DIFFERENTIAL/PLATELET
Basophils Relative: 1 % (ref 0–1)
Eosinophils Absolute: 0.2 10*3/uL (ref 0.0–0.7)
Hemoglobin: 14.1 g/dL (ref 12.0–15.0)
MCH: 29.8 pg (ref 26.0–34.0)
MCHC: 34.4 g/dL (ref 30.0–36.0)
Monocytes Relative: 11 % (ref 3–12)
Neutrophils Relative %: 53 % (ref 43–77)
RDW: 12.8 % (ref 11.5–15.5)

## 2013-07-04 LAB — POCT I-STAT TROPONIN I: Troponin i, poc: 0.01 ng/mL (ref 0.00–0.08)

## 2013-07-04 LAB — COMPREHENSIVE METABOLIC PANEL
Albumin: 3.8 g/dL (ref 3.5–5.2)
Alkaline Phosphatase: 112 U/L (ref 39–117)
BUN: 10 mg/dL (ref 6–23)
Creatinine, Ser: 0.77 mg/dL (ref 0.50–1.10)
Potassium: 3.8 mEq/L (ref 3.5–5.1)
Total Protein: 6.9 g/dL (ref 6.0–8.3)

## 2013-07-04 MED ORDER — KETOROLAC TROMETHAMINE 30 MG/ML IJ SOLN
30.0000 mg | Freq: Once | INTRAMUSCULAR | Status: DC
  Filled 2013-07-04: qty 1

## 2013-07-04 MED ORDER — SODIUM CHLORIDE 0.9 % IV BOLUS (SEPSIS)
1000.0000 mL | Freq: Once | INTRAVENOUS | Status: AC
  Administered 2013-07-04: 1000 mL via INTRAVENOUS

## 2013-07-04 MED ORDER — MECLIZINE HCL 25 MG PO TABS
25.0000 mg | ORAL_TABLET | Freq: Once | ORAL | Status: AC
  Administered 2013-07-04: 25 mg via ORAL
  Filled 2013-07-04: qty 1

## 2013-07-04 MED ORDER — MECLIZINE HCL 12.5 MG PO TABS: 12.5000 mg | ORAL_TABLET | Freq: Three times a day (TID) | ORAL | Status: DC | PRN

## 2013-07-04 NOTE — ED Notes (Signed)
Pt's roommate reports that she saw pt this morning at 1000 and that was LSN. PT also reports chest pain.

## 2013-07-04 NOTE — ED Notes (Signed)
Pt A&Ox4 and ambulatory at discharge.

## 2013-07-04 NOTE — ED Notes (Addendum)
When asked patient to provide urine sample, this RN assisted pt to ambulate to restroom, gait was slightly wobbly. During urination pt forgot to urinate in provided cup, sts "oh, I forgot" even after this RN reminded patient numerous times to urinate in provided cup.

## 2013-07-04 NOTE — ED Notes (Signed)
Pt. reports dizziness onset this morning and left breast pain for 1 month , alert and oriented , respirations unlabored . Speech clear / no facial asymmetry , equal strong grips , no arm drift , slightly unsteady when she stood up from wjheelchair at triage .
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
## 2013-07-04 NOTE — ED Notes (Signed)
Discontinued patients IV

## 2013-07-11 ENCOUNTER — Encounter: Payer: Self-pay | Admitting: Neurology

## 2013-07-11 ENCOUNTER — Ambulatory Visit (INDEPENDENT_AMBULATORY_CARE_PROVIDER_SITE_OTHER): Payer: Self-pay | Admitting: Neurology

## 2013-07-11 VITALS — BP 150/85 | HR 78 | Ht 65.0 in | Wt 156.0 lb

## 2013-07-11 DIAGNOSIS — R0789 Other chest pain: Secondary | ICD-10-CM

## 2013-07-11 DIAGNOSIS — F411 Generalized anxiety disorder: Secondary | ICD-10-CM

## 2013-07-11 DIAGNOSIS — Z87898 Personal history of other specified conditions: Secondary | ICD-10-CM

## 2013-07-11 DIAGNOSIS — G43009 Migraine without aura, not intractable, without status migrainosus: Secondary | ICD-10-CM

## 2013-07-11 DIAGNOSIS — R269 Unspecified abnormalities of gait and mobility: Secondary | ICD-10-CM | POA: Insufficient documentation

## 2013-07-11 DIAGNOSIS — F329 Major depressive disorder, single episode, unspecified: Secondary | ICD-10-CM
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
## 2013-08-06 ENCOUNTER — Ambulatory Visit
Admission: RE | Admit: 2013-08-06 | Discharge: 2013-08-06 | Disposition: A | Discharge status: Home / Self Care | Payer: BC Managed Care – PPO | Source: Ambulatory Visit | Attending: Neurology | Admitting: Neurology

## 2013-08-06 DIAGNOSIS — R269 Unspecified abnormalities of gait and mobility: Secondary | ICD-10-CM

## 2013-08-06 DIAGNOSIS — Z87898 Personal history of other specified conditions: Secondary | ICD-10-CM

## 2013-08-06 DIAGNOSIS — F329 Major depressive disorder, single episode, unspecified: Secondary | ICD-10-CM

## 2013-08-06 DIAGNOSIS — G43009 Migraine without aura, not intractable, without status migrainosus: Secondary | ICD-10-CM

## 2013-08-06 DIAGNOSIS — F411 Generalized anxiety disorder: Secondary | ICD-10-CM

## 2013-08-06 DIAGNOSIS — F3289 Other specified depressive episodes: Secondary | ICD-10-CM

## 2013-08-06 DIAGNOSIS — R0789 Other chest pain: Secondary | ICD-10-CM

## 2013-08-08 ENCOUNTER — Telehealth: Payer: Self-pay | Admitting: Neurology

## 2013-08-08 NOTE — Telephone Encounter (Signed)
Had MRI on Saturday 1/10 and has had a headache everyday since seeing the Dr. Lynnda Child like to come back for an appt

## 2013-08-09 NOTE — Telephone Encounter (Signed)
States she just received a call from this number. Was not able to find documentation of the call bck

## 2013-08-09 NOTE — Telephone Encounter (Signed)
PT called in again and stated that someone left a message regarding an earlier appointment.  She couldn't make out the name and there was no documentation on a phone call.  She also stated that she has been having headaches everyday since seeing the doctor.  Sometimes they feel like migraines and other days it's just throbbing on both sides of her head.  Please contact.  Thank you

## 2013-08-09 NOTE — Progress Notes (Signed)
Quick Note:  Please call patient for essentially normal MRI of brain ______

## 2013-08-09 NOTE — Telephone Encounter (Signed)
Patient is calling again, please advise.

## 2013-08-10 NOTE — Telephone Encounter (Signed)
I have reviewed her chart, Butch Penny, please call patient, her MRI of the brain is normal, she is on polypharmacy treatment already, keep her on current medications, try to move her appointment with Hoyle Sauer to her next available

## 2013-08-11 ENCOUNTER — Telehealth: Payer: Self-pay | Admitting: Nurse Practitioner

## 2013-08-11 NOTE — Telephone Encounter (Signed)
Patient is scheduled for Friday 08-12-12 with Hoyle Sauer, per Dr. Krista Blue.

## 2013-08-11 NOTE — Telephone Encounter (Signed)
Patient scheduled for 08-12-13 with Hoyle Sauer.

## 2013-08-11 NOTE — Telephone Encounter (Signed)
Patient would like a call back from nurse regarding scheduling a sooner appointment than March. She stated she missed a call earlier today. Please call.

## 2013-08-11 NOTE — Telephone Encounter (Signed)
I called and LMVM for pt that can move up appt to today if she is able to come (1430 or 1500 ) with Daun Peacock, NP.  Pt to call back.

## 2013-08-11 NOTE — Telephone Encounter (Signed)
Patient called stating that she is still having headaches and is unsteady while walking, wants to be seen sooner than her scheduled 10/11/13 appointment with Hoyle Sauer. Please call the patient.

## 2013-08-11 NOTE — Telephone Encounter (Signed)
Left message for patient that Dr. Krista Blue wanted her to stay on her current medications and get her an appointment to see [facility].  I told her to call as soon as she could before available appointments are taken.

## 2013-08-12 ENCOUNTER — Ambulatory Visit (INDEPENDENT_AMBULATORY_CARE_PROVIDER_SITE_OTHER): Payer: BC Managed Care – PPO | Admitting: Nurse Practitioner

## 2013-08-12 ENCOUNTER — Encounter: Payer: Self-pay | Admitting: Nurse Practitioner

## 2013-08-12 VITALS — BP 135/76 | HR 73 | Ht 64.25 in | Wt 156.0 lb

## 2013-08-12 DIAGNOSIS — R42 Dizziness and giddiness: Secondary | ICD-10-CM

## 2013-08-12 DIAGNOSIS — F411 Generalized anxiety disorder: Secondary | ICD-10-CM

## 2013-08-12 DIAGNOSIS — F3289 Other specified depressive episodes: Secondary | ICD-10-CM

## 2013-08-12 DIAGNOSIS — R5381 Other malaise: Secondary | ICD-10-CM

## 2013-08-12 DIAGNOSIS — F329 Major depressive disorder, single episode, unspecified: Secondary | ICD-10-CM

## 2013-08-12 DIAGNOSIS — R5383 Other fatigue: Secondary | ICD-10-CM

## 2013-08-12 DIAGNOSIS — R269 Unspecified abnormalities of gait and mobility: Secondary | ICD-10-CM

## 2013-08-12 DIAGNOSIS — R531 Weakness: Secondary | ICD-10-CM | POA: Insufficient documentation

## 2013-08-12 DIAGNOSIS — Z87898 Personal history of other specified conditions: Secondary | ICD-10-CM

## 2013-08-12 NOTE — Patient Instructions (Signed)
Please get labs checked Monday am fasting Schedule carotid doppler Continue asa F/U in 2 months
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
## 2013-08-15 ENCOUNTER — Other Ambulatory Visit (INDEPENDENT_AMBULATORY_CARE_PROVIDER_SITE_OTHER): Payer: Self-pay

## 2013-08-15 DIAGNOSIS — Z0289 Encounter for other administrative examinations: Secondary | ICD-10-CM

## 2013-08-16 LAB — STROKE PANEL
CRP: 2.7 mg/L (ref 0.0–4.9)
Cholesterol, Total: 243 mg/dL — ABNORMAL HIGH (ref 100–199)
HDL: 55 mg/dL (ref >39)
Hgb A1c MFr Bld: 5.2 % (ref 4.8–5.6)
Homocysteine: 11.6 umol/L (ref 0.0–15.0)
LDL CALC: 142 mg/dL — AB (ref 0–99)
LDl/HDL Ratio: 2.6 ratio units (ref 0.0–3.2)
Sed Rate: 12 mm/hr (ref 0–40)
Triglycerides: 230 mg/dL — ABNORMAL HIGH (ref 0–149)
VLDL CHOLESTEROL CAL: 46 mg/dL — AB (ref 5–40)

## 2013-08-17 ENCOUNTER — Telehealth: Payer: Self-pay | Admitting: Nurse Practitioner

## 2013-08-17 MED ORDER — ATORVASTATIN CALCIUM 20 MG PO TABS: 20.0000 mg | ORAL_TABLET | Freq: Every day | ORAL | Status: DC

## 2013-08-17 NOTE — Telephone Encounter (Signed)
Patient called, left message that lipids are elevated and needs to start medication. Eat low fat diet . Meds called to pharmacy

## 2013-08-18 ENCOUNTER — Ambulatory Visit (INDEPENDENT_AMBULATORY_CARE_PROVIDER_SITE_OTHER): Payer: BC Managed Care – PPO | Admitting: *Deleted

## 2013-08-18 ENCOUNTER — Telehealth: Payer: Self-pay | Admitting: Neurology

## 2013-08-18 DIAGNOSIS — G43909 Migraine, unspecified, not intractable, without status migrainosus: Secondary | ICD-10-CM

## 2013-08-18 IMAGING — CT CT ABD-PELV W/ CM
1 of 3 series · 14 of 32 positions shown, 19 images · IV contrast (APPLIED)
Comparison: CT abdomen and pelvis of 03/17/2010

CLINICAL DATA: Left lower quadrant pain

CT ABDOMEN AND PELVIS WITH CONTRAST
TECHNIQUE: Multidetector CT imaging of the abdomen and pelvis was
performed following the standard protocol during bolus
administration of intravenous contrast.
Contrast: 80mL OMNIPAQUE IOHEXOL 300 MG/ML IV SOLN

[Series 2: abd/pel with · axial · 0.74mm/px · z∈[-242,+148]mm · 14 of 88 slices shown, 19 images]
[im 5/88  soft-tissue]
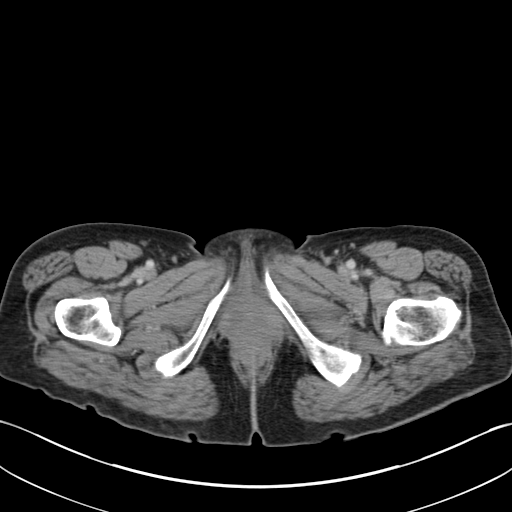
[im 5/88  bone]
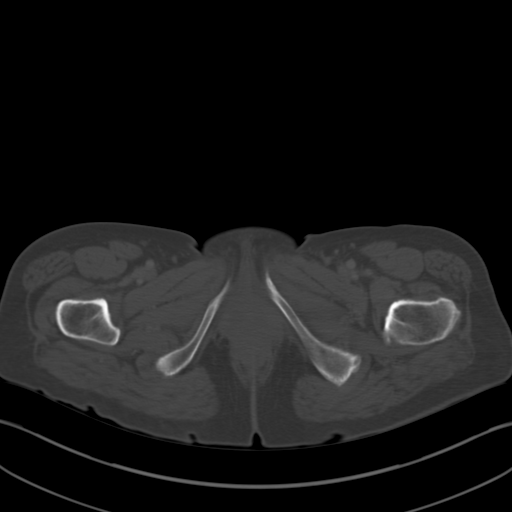
[im 14/88  soft-tissue]
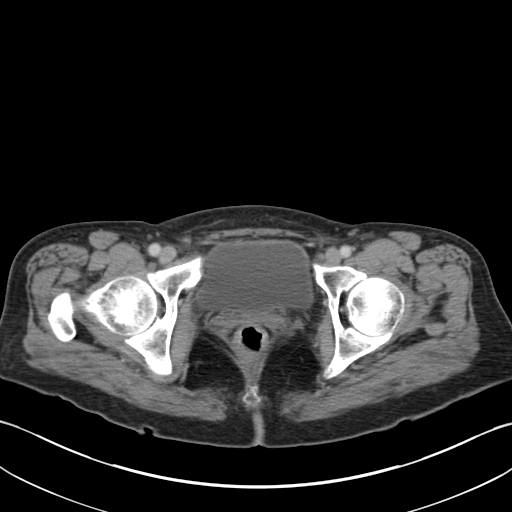
[im 18/88  soft-tissue]
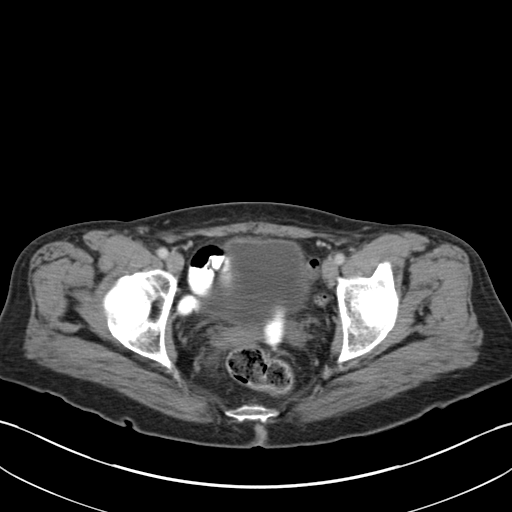
[im 27/88  soft-tissue]
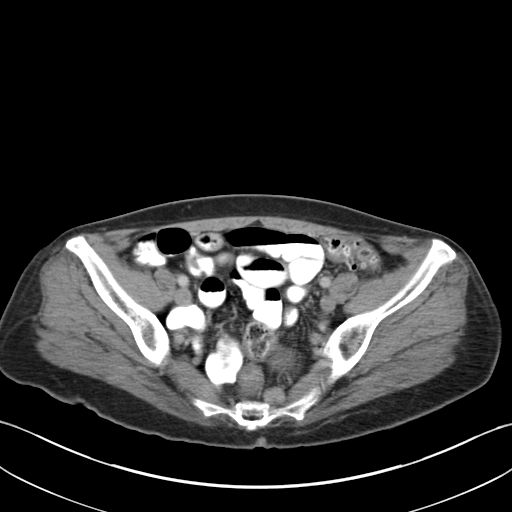
[im 31/88  soft-tissue]
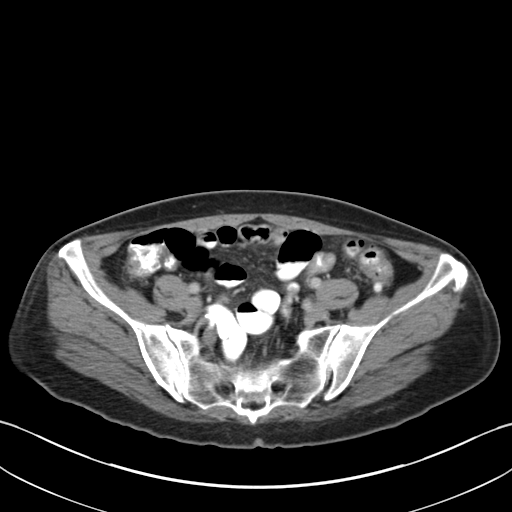
[im 40/88  soft-tissue]
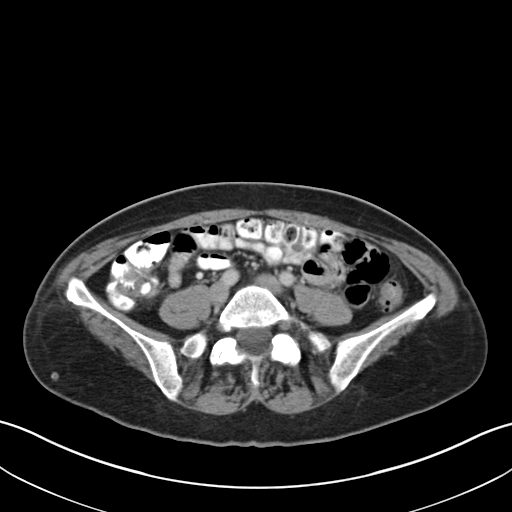
[im 44/88  soft-tissue]
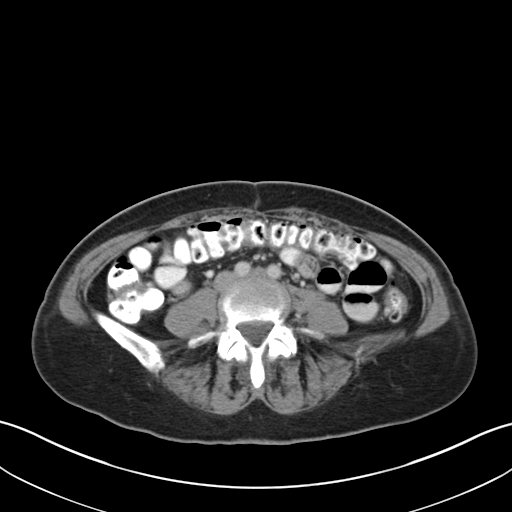
[im 48/88  soft-tissue]
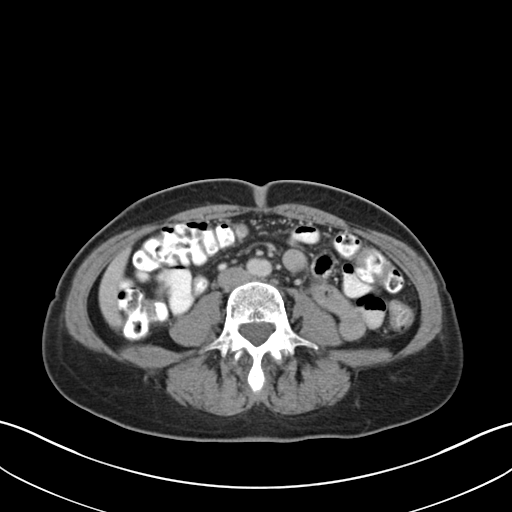
[im 57/88  soft-tissue]
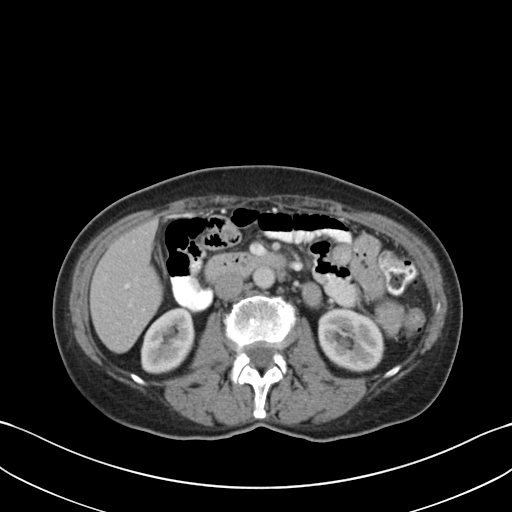
[im 57/88  bone]
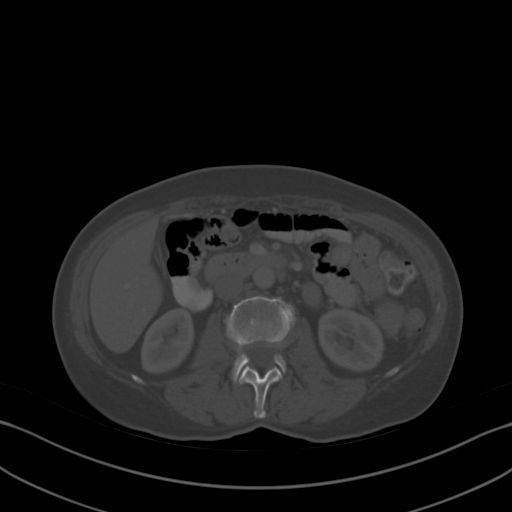
[im 61/88  soft-tissue]
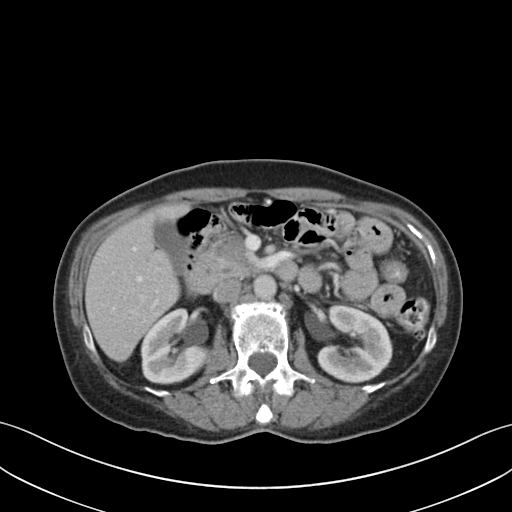
[im 70/88  soft-tissue]
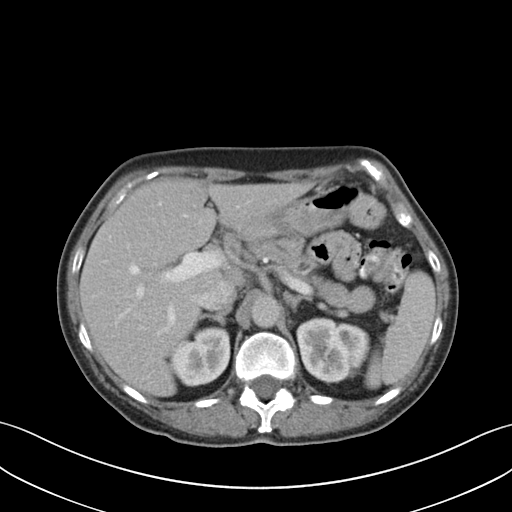
[im 70/88  lung]
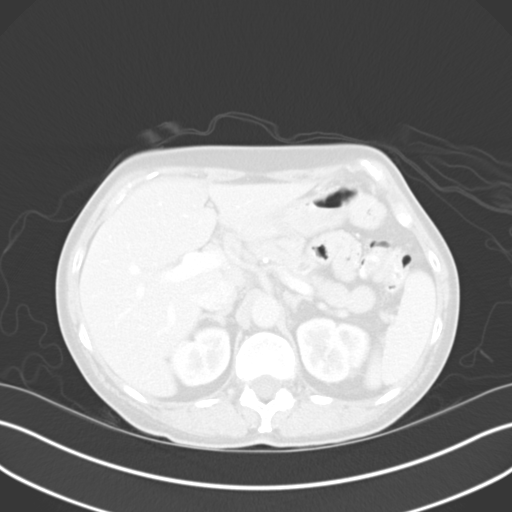
[im 74/88  soft-tissue]
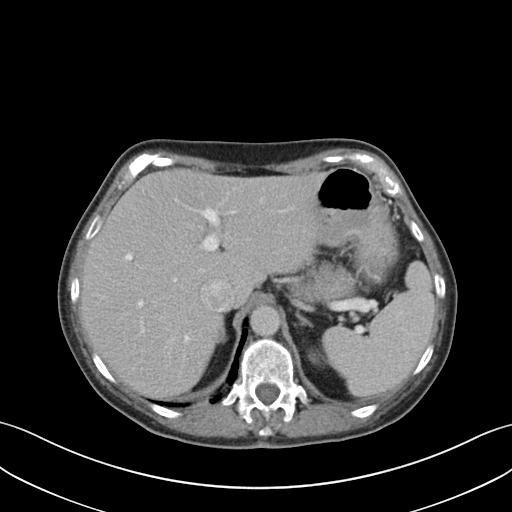
[im 74/88  lung]
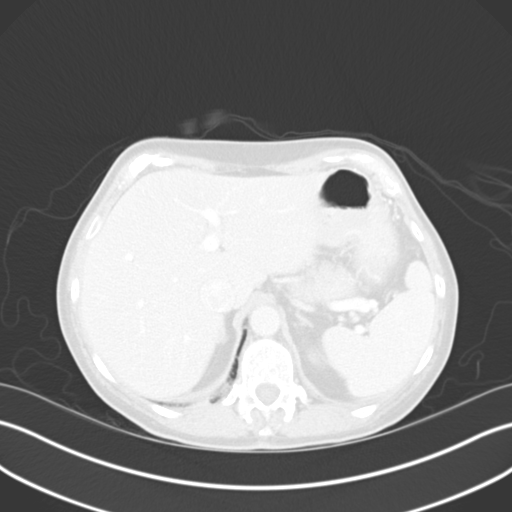
[im 79/88  lung]
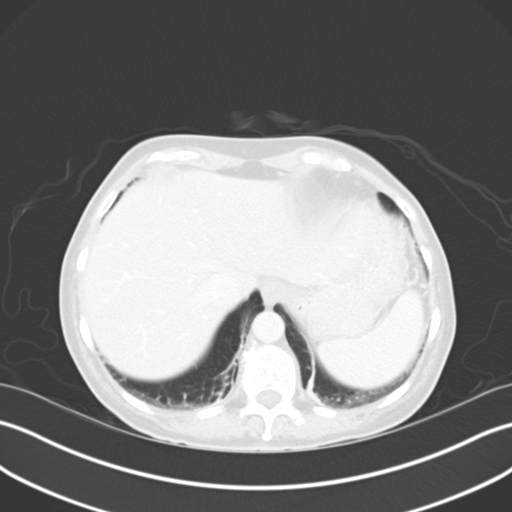
[im 83/88  soft-tissue]
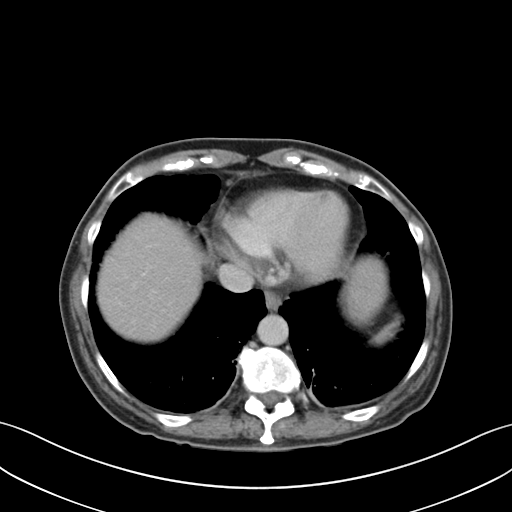
[im 83/88  lung]
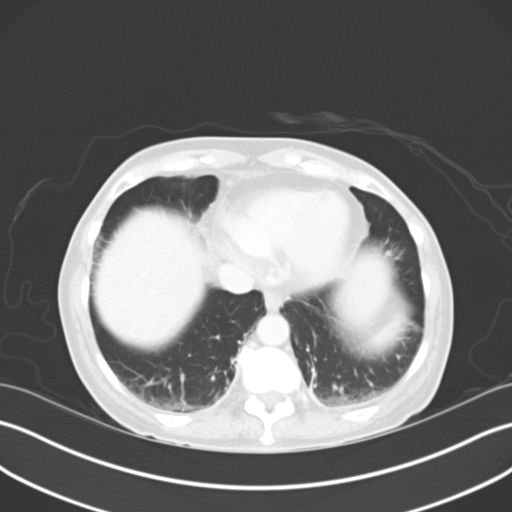

[14 of 32 positions shown; findings below may reference images not displayed]

FINDINGS: Mild linear scarring is noted of both lung bases.  The
liver enhances with no focal abnormality and no ductal dilatation
is seen.  No calcified gallstones are noted.  The pancreas is
normal in size and the pancreatic duct is prominent to the ampulla.
This prominent duct may be due to stricture, with no calculus or
mass noted.  The adrenal glands and spleen are unremarkable.  The
stomach is decompressed.  The kidneys enhance with no calculus or
mass.  A tiny cyst is stable within the upper pole of the left
kidney.  The abdominal aorta is normal in caliber.  The celiac axis
and SMA are patent.

Multiple rectosigmoid and descending colon diverticula are present
with diverticulosis.  There is some thickening of the mucosa
particularly of the distal descending colon but no definite acute
diverticulitis is evident.  The cecum, terminal ileum, and appendix
are unremarkable.  No free fluid is seen within the pelvis.  The
urinary bladder is moderately distended with no abnormality noted.
The uterus appears to have been resected previously.  No bony
abnormality is seen.
IMPRESSION: 1.  Multiple descending colon and rectosigmoid diverticula with
diverticulosis.  No definite evidence of diverticulitis.
2.  Somewhat prominent pancreatic duct but no definite mass or
calculus.  Correlate with laboratory values.
3.  The appendix and terminal ileum appear normal.

## 2013-08-18 MED ORDER — VALPROATE SODIUM 500 MG/5ML IV SOLN
1000.0000 mg | INTRAVENOUS | Status: DC
  Administered 2013-08-18: 1000 mg via INTRAVENOUS

## 2013-08-18 MED ORDER — NORTRIPTYLINE HCL 10 MG PO CAPS
ORAL_CAPSULE | ORAL | Status: DC
Start: 2013-08-18 — End: 2013-08-29

## 2013-08-18 MED ORDER — PROMETHAZINE HCL 25 MG/ML IJ SOLN
12.5000 mg | Freq: Once | INTRAMUSCULAR | Status: AC
  Administered 2013-08-18: 12.5 mg via INTRAMUSCULAR

## 2013-08-18 NOTE — Telephone Encounter (Signed)
Pt ok to come in for depacon.   Will bring driver.

## 2013-08-18 NOTE — Telephone Encounter (Signed)
Pt can come in for VPA infusion. If we have something here for nausea she can have that too. Please make certain someone drives her if you give her nausea medication.

## 2013-08-18 NOTE — Patient Instructions (Signed)
Pt to check out, per Daleen Squibb, RN.

## 2013-08-18 NOTE — Telephone Encounter (Signed)
I called pt and she has had headaches since December, at times gets worse then eases slightly.  She has had since Saturday bad headache w/ nausea.  Not taking any thing for these (ibuprofen since having carotid on next Tuesday) per Carolyns instruction.  Level 9 is throbbing today.  Please advise.  Take something ??

## 2013-08-18 NOTE — Telephone Encounter (Signed)
Patient called to state she was in today for an infusion. Patient states her headache has returned and has been going on for an hour now. Please call patient and advise.

## 2013-08-18 NOTE — Telephone Encounter (Signed)
PT called in and stated she has been having a really bad migraine for 2+ days with nausea.  She stated that she has had a headache since December but it hasn't been to this degree.  Asked if Cecille Rubin could prescribe something that isn't an antiinflammatory.  Please call.

## 2013-08-18 NOTE — Telephone Encounter (Signed)
I discussed with Dr. Krista Blue . Will call in preventive Nortriptyline take (2)  10mg  tabs at hs every day. It has been faxed. Please call the pt

## 2013-08-18 NOTE — Progress Notes (Signed)
Pt here for Depacon Infusion.   She has had headache since December, waxes and wanes.  Since Saturday she has had bad headache w/ nausea.  Level 9.  (L temporal area).  Instructed her on the plan for depacon IV 1000mg  (rapid infusion).  Also to give phenergan 12.5mg  IM for nausea.  Under aseptic technique 24g angiocath inserted to R forearm (top of wrist area) with good blood return , Depacon 1000mg  IV / 0.9% NS 129ml infusion started at 1428, finished at 1438, Daleen Squibb, RN discontinued IV, pressure applied and bandage applied. Level to 3.  Phenergan 12.5mg  IM given to L gluteal,   per Daleen Squibb, RN.  Pt to check out.  Driver with her.

## 2013-08-19 NOTE — Telephone Encounter (Signed)
I called pt back and LMVM for her of that below.  If she has other questions to call me back.

## 2013-08-19 NOTE — Telephone Encounter (Signed)
Pamelor is classified as an anti depressant  But it is not used that way because it does not work. It does however work for pain and neurology uses it a lot. Dr. Krista Blue is aware of her meds.

## 2013-08-19 NOTE — Telephone Encounter (Signed)
I called pt and relayed the message.  She has questions relating to taking another anti depressant even though realizes taking for headaches.

## 2013-08-23 ENCOUNTER — Ambulatory Visit (INDEPENDENT_AMBULATORY_CARE_PROVIDER_SITE_OTHER): Payer: BC Managed Care – PPO

## 2013-08-23 DIAGNOSIS — R531 Weakness: Secondary | ICD-10-CM

## 2013-08-23 DIAGNOSIS — R269 Unspecified abnormalities of gait and mobility: Secondary | ICD-10-CM

## 2013-08-23 DIAGNOSIS — R42 Dizziness and giddiness: Secondary | ICD-10-CM

## 2013-08-29 ENCOUNTER — Telehealth: Payer: Self-pay | Admitting: Nurse Practitioner

## 2013-08-29 ENCOUNTER — Emergency Department (HOSPITAL_COMMUNITY)
Admission: EM | Admit: 2013-08-29 | Discharge: 2013-08-29 | Disposition: A | Discharge status: Home / Self Care | Payer: BC Managed Care – PPO | Attending: Emergency Medicine | Admitting: Emergency Medicine

## 2013-08-29 ENCOUNTER — Emergency Department (HOSPITAL_COMMUNITY): Payer: BC Managed Care – PPO

## 2013-08-29 ENCOUNTER — Encounter (HOSPITAL_COMMUNITY): Payer: Self-pay | Admitting: Emergency Medicine

## 2013-08-29 DIAGNOSIS — K5900 Constipation, unspecified: Secondary | ICD-10-CM | POA: Insufficient documentation

## 2013-08-29 DIAGNOSIS — Z8679 Personal history of other diseases of the circulatory system: Secondary | ICD-10-CM | POA: Insufficient documentation

## 2013-08-29 DIAGNOSIS — F3289 Other specified depressive episodes: Secondary | ICD-10-CM | POA: Insufficient documentation

## 2013-08-29 DIAGNOSIS — F329 Major depressive disorder, single episode, unspecified: Secondary | ICD-10-CM | POA: Insufficient documentation

## 2013-08-29 DIAGNOSIS — R110 Nausea: Secondary | ICD-10-CM | POA: Insufficient documentation

## 2013-08-29 DIAGNOSIS — Z79899 Other long term (current) drug therapy: Secondary | ICD-10-CM | POA: Insufficient documentation

## 2013-08-29 DIAGNOSIS — Z7982 Long term (current) use of aspirin: Secondary | ICD-10-CM | POA: Insufficient documentation

## 2013-08-29 DIAGNOSIS — Z87891 Personal history of nicotine dependence: Secondary | ICD-10-CM | POA: Insufficient documentation

## 2013-08-29 DIAGNOSIS — Z880 Allergy status to penicillin: Secondary | ICD-10-CM | POA: Insufficient documentation

## 2013-08-29 DIAGNOSIS — R109 Unspecified abdominal pain: Secondary | ICD-10-CM

## 2013-08-29 LAB — URINALYSIS, ROUTINE W REFLEX MICROSCOPIC
BILIRUBIN URINE: NEGATIVE
GLUCOSE, UA: NEGATIVE mg/dL
Hgb urine dipstick: NEGATIVE
KETONES UR: NEGATIVE mg/dL
Leukocytes, UA: NEGATIVE
Nitrite: NEGATIVE
PROTEIN: NEGATIVE mg/dL
Specific Gravity, Urine: 1.026 (ref 1.005–1.030)
UROBILINOGEN UA: 0.2 mg/dL (ref 0.0–1.0)
pH: 6 (ref 5.0–8.0)

## 2013-08-29 LAB — CBC WITH DIFFERENTIAL/PLATELET
BASOS ABS: 0 10*3/uL (ref 0.0–0.1)
BASOS PCT: 0 % (ref 0–1)
Eosinophils Absolute: 0.1 10*3/uL (ref 0.0–0.7)
Eosinophils Relative: 1 % (ref 0–5)
HEMATOCRIT: 43.5 % (ref 36.0–46.0)
HEMOGLOBIN: 14.7 g/dL (ref 12.0–15.0)
LYMPHS PCT: 17 % (ref 12–46)
Lymphs Abs: 1.6 10*3/uL (ref 0.7–4.0)
MCH: 29.7 pg (ref 26.0–34.0)
MCHC: 33.8 g/dL (ref 30.0–36.0)
MCV: 87.9 fL (ref 78.0–100.0)
MONOS PCT: 10 % (ref 3–12)
Monocytes Absolute: 0.9 10*3/uL (ref 0.1–1.0)
NEUTROS ABS: 6.5 10*3/uL (ref 1.7–7.7)
Neutrophils Relative %: 71 % (ref 43–77)
Platelets: 245 10*3/uL (ref 150–400)
RBC: 4.95 MIL/uL (ref 3.87–5.11)
RDW: 12.8 % (ref 11.5–15.5)
WBC: 9.1 10*3/uL (ref 4.0–10.5)

## 2013-08-29 LAB — COMPREHENSIVE METABOLIC PANEL
ALBUMIN: 4 g/dL (ref 3.5–5.2)
ALK PHOS: 110 U/L (ref 39–117)
ALT: 18 U/L (ref 0–35)
AST: 15 U/L (ref 0–37)
BILIRUBIN TOTAL: 0.5 mg/dL (ref 0.3–1.2)
BUN: 19 mg/dL (ref 6–23)
CHLORIDE: 103 meq/L (ref 96–112)
CO2: 25 mEq/L (ref 19–32)
Calcium: 9.8 mg/dL (ref 8.4–10.5)
Creatinine, Ser: 0.85 mg/dL (ref 0.50–1.10)
GFR calc Af Amer: 83 mL/min — ABNORMAL LOW (ref >90)
GFR, EST NON AFRICAN AMERICAN: 72 mL/min — AB (ref >90)
Glucose, Bld: 93 mg/dL (ref 70–99)
Potassium: 4.2 mEq/L (ref 3.7–5.3)
Sodium: 143 mEq/L (ref 137–147)
Total Protein: 7.5 g/dL (ref 6.0–8.3)

## 2013-08-29 MED ORDER — FLEET ENEMA 7-19 GM/118ML RE ENEM
1.0000 | ENEMA | Freq: Once | RECTAL | Status: AC
  Administered 2013-08-29: 1 via RECTAL
  Filled 2013-08-29: qty 1

## 2013-08-29 MED ORDER — ACETAMINOPHEN 500 MG PO TABS
1000.0000 mg | ORAL_TABLET | Freq: Once | ORAL | Status: AC
  Administered 2013-08-29: 1000 mg via ORAL
  Filled 2013-08-29: qty 2

## 2013-08-29 NOTE — Discharge Instructions (Signed)
Use miralax 3-4 times a day until you go regularly then use it daily.   Use fleets enemas as needed.   Take tylenol or motrin for pain.   Follow up with [facility].   Return to ER if you have severe pain, fever, vomiting.

## 2013-08-29 NOTE — Telephone Encounter (Signed)
Carotid doppler study is negative for stenosis
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
## 2013-08-29 NOTE — ED Notes (Signed)
Pt is here with left lower abdominal pain since this am.  Pt reports nausea, no diarrhea or constipation.  No urinary symptoms or vaginal d/c or bleeding.  Pt states xray showed a lot of stool in colon.  Sent from urgent care and they questioned diverticulitis.  Pt has had that before

## 2013-08-29 NOTE — Telephone Encounter (Signed)
Called patient to inform her about her carotid doppler study being negative for stenosis. I advised the patient that if she has any other problems, questions or concerns to call the office. Patient verbalized understanding.

## 2013-09-08 ENCOUNTER — Encounter: Payer: Self-pay | Admitting: Internal Medicine

## 2013-10-11 ENCOUNTER — Encounter: Payer: Self-pay | Admitting: Nurse Practitioner

## 2013-10-11 ENCOUNTER — Telehealth: Payer: Self-pay | Admitting: Nurse Practitioner

## 2013-10-11 ENCOUNTER — Ambulatory Visit (INDEPENDENT_AMBULATORY_CARE_PROVIDER_SITE_OTHER): Payer: BC Managed Care – PPO | Admitting: Nurse Practitioner

## 2013-10-11 VITALS — BP 117/71 | HR 69 | Ht 64.0 in | Wt 156.0 lb

## 2013-10-11 DIAGNOSIS — R5383 Other fatigue: Secondary | ICD-10-CM

## 2013-10-11 DIAGNOSIS — R531 Weakness: Secondary | ICD-10-CM

## 2013-10-11 DIAGNOSIS — R269 Unspecified abnormalities of gait and mobility: Secondary | ICD-10-CM

## 2013-10-11 DIAGNOSIS — E785 Hyperlipidemia, unspecified: Secondary | ICD-10-CM

## 2013-10-11 DIAGNOSIS — R51 Headache: Secondary | ICD-10-CM

## 2013-10-11 DIAGNOSIS — R5381 Other malaise: Secondary | ICD-10-CM

## 2013-10-11 DIAGNOSIS — R42 Dizziness and giddiness: Secondary | ICD-10-CM

## 2013-10-11 DIAGNOSIS — R519 Headache, unspecified: Secondary | ICD-10-CM

## 2013-10-11 DIAGNOSIS — G8929 Other chronic pain: Secondary | ICD-10-CM | POA: Diagnosis present

## 2013-10-11 DIAGNOSIS — G43009 Migraine without aura, not intractable, without status migrainosus: Secondary | ICD-10-CM

## 2013-10-11 MED ORDER — TOPIRAMATE 25 MG PO TABS: ORAL_TABLET | ORAL | Status: DC

## 2013-10-11 NOTE — Patient Instructions (Signed)
Continue aspirin for secondary stroke prevention  limit the use of over-the-counter drugs for headaches Continue Lipitor 20 mg daily for hyperlipidemia which is in good control Followup in 6 months Use  Allegra or Claritin for seasonal allergies

## 2013-10-11 NOTE — Telephone Encounter (Signed)
Spoke with patient and she is ready to schedule the MRA-Brain, also Dr Krista Blue had talked about a temporal biopsy, is ready to schedule that as well
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
## 2013-10-11 NOTE — Telephone Encounter (Signed)
Discussed with Dr. Krista Blue. Will order. Patient called.

## 2013-10-12 ENCOUNTER — Encounter: Payer: Self-pay | Admitting: Internal Medicine

## 2013-10-12 ENCOUNTER — Ambulatory Visit (INDEPENDENT_AMBULATORY_CARE_PROVIDER_SITE_OTHER): Payer: BC Managed Care – PPO | Admitting: Internal Medicine

## 2013-10-12 VITALS — BP 114/60 | HR 68 | Ht 64.57 in | Wt 157.1 lb

## 2013-10-12 DIAGNOSIS — K219 Gastro-esophageal reflux disease without esophagitis: Secondary | ICD-10-CM

## 2013-10-12 DIAGNOSIS — R1310 Dysphagia, unspecified: Secondary | ICD-10-CM

## 2013-10-12 DIAGNOSIS — K5900 Constipation, unspecified: Secondary | ICD-10-CM

## 2013-10-12 DIAGNOSIS — Z1211 Encounter for screening for malignant neoplasm of colon: Secondary | ICD-10-CM

## 2013-10-12 DIAGNOSIS — K5730 Diverticulosis of large intestine without perforation or abscess without bleeding: Secondary | ICD-10-CM

## 2013-10-12 MED ORDER — MOVIPREP 100 G PO SOLR: 1.0000 | Freq: Once | ORAL | Status: DC

## 2013-10-12 NOTE — Patient Instructions (Signed)

## 2013-10-12 NOTE — Progress Notes (Signed)
HISTORY OF PRESENT ILLNESS:  Dorothy White is a 63 y.o. female with the below listed medical history who presents today regarding chronic abdominal pain, constipation, and chronic GERD. The patient has not been seen here many years. She underwent colonoscopy in 1999 and was found to have diverticulosis and hemorrhoids. Tells me that she had a bout of diverticulitis in 2007 or 2008. She has had chronic constipation for many years with associated left lower quadrant discomfort. Symptoms improved post defecation and worsening problems with left lower quadrant pain about 4 weeks ago for which she went to the emergency room. Was diagnosed with constipation. Was treated with enemas and MiraLax. Symptoms resolved. Currently using MiraLax more regularly with good results. No bleeding. No family history of colon cancer. She has had GERD for 3 or 4 years. Occasional intermittent solid food dysphagia. Takes H2 receptor antagonist on demand. Family history of esophageal cancer. Prior history of smoking and occasional alcohol use. Other GI complaints including intermittent problems with bloating and nausea  REVIEW OF SYSTEMS:  All non-GI ROS negative except for  Past Medical History  Diagnosis Date  . Arrhythmia   . Diverticulitis   . Depression   . Headache(784.0)   . Anxiety   . HLD (hyperlipidemia)     Past Surgical History  Procedure Laterality Date  . Tonsillectomy    . Abdominal hysterectomy    . Breast enhancement surgery    . Rhinoplasty      Social History Dorothy White  reports that she quit smoking about 2 years ago. Her smoking use included Cigarettes. She smoked 0.00 packs per day. She has never used smokeless tobacco. She reports that she drinks about 0.5 ounces of alcohol per week. She reports that she does not use illicit drugs.  family history includes Esophageal cancer in her brother; Heart attack in her father; Hypertension in her brother; Lung cancer in her  mother.  Allergies  Allergen Reactions  . Penicillins Hives       PHYSICAL EXAMINATION: Vital signs: BP 114/60  Pulse 68  Ht 5' 4.57" (1.64 m)  Wt 157 lb 2 oz (71.271 kg)  BMI 26.50 kg/m2  Constitutional: generally well-appearing, no acute distress Psychiatric: alert and oriented x3, cooperative Eyes: extraocular movements intact, anicteric, conjunctiva pink Mouth: oral pharynx moist, no lesions Neck: supple no lymphadenopathy Cardiovascular: heart regular rate and rhythm, no murmur Lungs: clear to auscultation bilaterally Abdomen: soft, mild tenderness in left lower quadrant, nondistended, no obvious ascites, no peritoneal signs, normal bowel sounds, no organomegaly Rectal: For until colonoscopy Extremities: no lower extremity edema bilaterally Skin: no lesions on visible extremities Neuro: No focal deficits.   ASSESSMENT:  #1. Chronic constipation. #2. Chronic intermittent problems with left lower quadrant pain. Presumably related to constipation. Improved with improved bowel movements #3. No diverticulosis and prior bout of diverticulitis per the patient #4. Screening colonoscopy. Due. Appropriate candidate #5. Chronic GERD with intermittent dysphagia. Rule out peptic stricture #6. No PCP  PLAN:  #1. Reflux precautions #2. H2 receptor antagonist or PPI on demand to control reflux symptoms #3. Upper endoscopy with possible esophageal dilation.The nature of the procedure, as well as the risks, benefits, and alternatives were carefully and thoroughly reviewed with the patient. Ample time for discussion and questions allowed. The patient understood, was satisfied, and agreed to proceed. #4. Screening colonoscopy.The nature of the procedure, as well as the risks, benefits, and alternatives were carefully and thoroughly reviewed with the patient. Ample time for discussion and questions  allowed. The patient understood, was satisfied, and agreed to proceed. #5. MiraLax as needed  for constipation. Titrated to need #6. Advised to get a PCP ASAP for overall general medical care

## 2013-10-14 ENCOUNTER — Encounter: Payer: Self-pay | Admitting: Internal Medicine

## 2013-10-17 ENCOUNTER — Encounter: Payer: Self-pay | Admitting: Internal Medicine

## 2013-10-19 ENCOUNTER — Ambulatory Visit (INDEPENDENT_AMBULATORY_CARE_PROVIDER_SITE_OTHER): Payer: BC Managed Care – PPO

## 2013-10-19 DIAGNOSIS — R42 Dizziness and giddiness: Secondary | ICD-10-CM

## 2013-10-19 DIAGNOSIS — R51 Headache: Secondary | ICD-10-CM

## 2013-10-21 NOTE — Progress Notes (Signed)
Quick Note:  Called to share normal MRA/head per Ms Martin's findings, she verbalized understanding ______

## 2013-10-25 ENCOUNTER — Encounter: Payer: Self-pay | Admitting: Internal Medicine

## 2013-10-28 ENCOUNTER — Emergency Department (HOSPITAL_COMMUNITY): Payer: BC Managed Care – PPO

## 2013-10-28 ENCOUNTER — Encounter (HOSPITAL_COMMUNITY): Payer: Self-pay | Admitting: Emergency Medicine

## 2013-10-28 ENCOUNTER — Emergency Department (HOSPITAL_COMMUNITY)
Admission: EM | Admit: 2013-10-28 | Discharge: 2013-10-28 | Disposition: A | Discharge status: Home / Self Care | Payer: BC Managed Care – PPO | Attending: Emergency Medicine | Admitting: Emergency Medicine

## 2013-10-28 DIAGNOSIS — Z79899 Other long term (current) drug therapy: Secondary | ICD-10-CM | POA: Insufficient documentation

## 2013-10-28 DIAGNOSIS — N949 Unspecified condition associated with female genital organs and menstrual cycle: Secondary | ICD-10-CM | POA: Insufficient documentation

## 2013-10-28 DIAGNOSIS — Z87891 Personal history of nicotine dependence: Secondary | ICD-10-CM | POA: Insufficient documentation

## 2013-10-28 DIAGNOSIS — E785 Hyperlipidemia, unspecified: Secondary | ICD-10-CM | POA: Insufficient documentation

## 2013-10-28 DIAGNOSIS — Z880 Allergy status to penicillin: Secondary | ICD-10-CM | POA: Insufficient documentation

## 2013-10-28 DIAGNOSIS — K5732 Diverticulitis of large intestine without perforation or abscess without bleeding: Secondary | ICD-10-CM | POA: Insufficient documentation

## 2013-10-28 DIAGNOSIS — F3289 Other specified depressive episodes: Secondary | ICD-10-CM | POA: Insufficient documentation

## 2013-10-28 DIAGNOSIS — K5792 Diverticulitis of intestine, part unspecified, without perforation or abscess without bleeding: Secondary | ICD-10-CM

## 2013-10-28 DIAGNOSIS — Z7982 Long term (current) use of aspirin: Secondary | ICD-10-CM | POA: Insufficient documentation

## 2013-10-28 DIAGNOSIS — F329 Major depressive disorder, single episode, unspecified: Secondary | ICD-10-CM | POA: Insufficient documentation

## 2013-10-28 DIAGNOSIS — F411 Generalized anxiety disorder: Secondary | ICD-10-CM | POA: Insufficient documentation

## 2013-10-28 LAB — BASIC METABOLIC PANEL
BUN: 9 mg/dL (ref 6–23)
CALCIUM: 9.6 mg/dL (ref 8.4–10.5)
CHLORIDE: 104 meq/L (ref 96–112)
CO2: 26 meq/L (ref 19–32)
Creatinine, Ser: 0.73 mg/dL (ref 0.50–1.10)
GFR calc Af Amer: >90 mL/min (ref >90)
GFR calc non Af Amer: 90 mL/min — ABNORMAL LOW (ref >90)
Glucose, Bld: 123 mg/dL — ABNORMAL HIGH (ref 70–99)
Potassium: 4.1 mEq/L (ref 3.7–5.3)
Sodium: 143 mEq/L (ref 137–147)

## 2013-10-28 LAB — CBC WITH DIFFERENTIAL/PLATELET
BASOS ABS: 0 10*3/uL (ref 0.0–0.1)
BASOS PCT: 0 % (ref 0–1)
EOS PCT: 2 % (ref 0–5)
Eosinophils Absolute: 0.2 10*3/uL (ref 0.0–0.7)
HCT: 38.6 % (ref 36.0–46.0)
HEMOGLOBIN: 12.5 g/dL (ref 12.0–15.0)
LYMPHS PCT: 23 % (ref 12–46)
Lymphs Abs: 1.6 10*3/uL (ref 0.7–4.0)
MCH: 28.5 pg (ref 26.0–34.0)
MCHC: 32.4 g/dL (ref 30.0–36.0)
MCV: 88.1 fL (ref 78.0–100.0)
MONO ABS: 0.5 10*3/uL (ref 0.1–1.0)
Monocytes Relative: 8 % (ref 3–12)
NEUTROS ABS: 4.8 10*3/uL (ref 1.7–7.7)
Neutrophils Relative %: 67 % (ref 43–77)
Platelets: 214 10*3/uL (ref 150–400)
RBC: 4.38 MIL/uL (ref 3.87–5.11)
RDW: 12.9 % (ref 11.5–15.5)
WBC: 7.1 10*3/uL (ref 4.0–10.5)

## 2013-10-28 LAB — URINALYSIS, ROUTINE W REFLEX MICROSCOPIC
BILIRUBIN URINE: NEGATIVE
GLUCOSE, UA: NEGATIVE mg/dL
Hgb urine dipstick: NEGATIVE
KETONES UR: NEGATIVE mg/dL
Leukocytes, UA: NEGATIVE
Nitrite: POSITIVE — AB
PH: 6.5 (ref 5.0–8.0)
PROTEIN: NEGATIVE mg/dL
Specific Gravity, Urine: 1.026 (ref 1.005–1.030)
Urobilinogen, UA: 1 mg/dL (ref 0.0–1.0)

## 2013-10-28 LAB — URINE MICROSCOPIC-ADD ON

## 2013-10-28 MED ORDER — METRONIDAZOLE 500 MG PO TABS: 500.0000 mg | ORAL_TABLET | Freq: Three times a day (TID) | ORAL | Status: DC

## 2013-10-28 MED ORDER — ONDANSETRON 4 MG PO TBDP: 4.0000 mg | ORAL_TABLET | Freq: Three times a day (TID) | ORAL | Status: DC | PRN

## 2013-10-28 MED ORDER — METRONIDAZOLE 500 MG PO TABS
500.0000 mg | ORAL_TABLET | Freq: Once | ORAL | Status: AC
  Administered 2013-10-28: 500 mg via ORAL
  Filled 2013-10-28: qty 1

## 2013-10-28 MED ORDER — HYDROCODONE-ACETAMINOPHEN 5-325 MG PO TABS: 1.0000 | ORAL_TABLET | Freq: Four times a day (QID) | ORAL | Status: DC | PRN

## 2013-10-28 MED ORDER — IOHEXOL 300 MG/ML  SOLN
100.0000 mL | Freq: Once | INTRAMUSCULAR | Status: AC | PRN
  Administered 2013-10-28: 100 mL via INTRAVENOUS

## 2013-10-28 MED ORDER — CIPROFLOXACIN HCL 500 MG PO TABS: 500.0000 mg | ORAL_TABLET | Freq: Two times a day (BID) | ORAL | Status: DC

## 2013-10-28 MED ORDER — CIPROFLOXACIN HCL 500 MG PO TABS
500.0000 mg | ORAL_TABLET | Freq: Once | ORAL | Status: AC
  Administered 2013-10-28: 500 mg via ORAL
  Filled 2013-10-28: qty 1

## 2013-10-28 MED ORDER — ONDANSETRON HCL 4 MG/2ML IJ SOLN
4.0000 mg | Freq: Once | INTRAMUSCULAR | Status: AC
  Administered 2013-10-28: 4 mg via INTRAVENOUS
  Filled 2013-10-28: qty 2

## 2013-10-28 NOTE — ED Notes (Signed)
Pt reports having generalized lower abdominal pain that started on Wednesday. Pt states she went to her PCP yesterday and was told that she had a UTI and was started on an antibiotic. Pt reports the pain has worsened today, the pt states she called her PCP office, and due to the office being closed was instructed to present to the ED. Pt reports nausea, however denies emesis or diarrhea. Pt is A/O x4, in NAD, and vitals are WDL.

## 2013-10-28 NOTE — ED Provider Notes (Signed)
CSN: [id number]     Arrival date & time 10/28/13  1509 History   First MD Initiated Contact with Patient 10/28/13 1522     Chief Complaint  Patient presents with  . Abdominal Pain     (Consider location/radiation/quality/duration/timing/severity/associated sxs/prior Treatment) Patient is a 63 y.o. female presenting with abdominal pain. The history is provided by the patient and medical records. No language interpreter was used.  Abdominal Pain Pain location:  Suprapubic and RLQ Pain quality: throbbing   Pain radiates to:  Does not radiate Pain severity:  Moderate Onset quality:  Sudden Duration:  2 days Timing:  Intermittent Progression:  Worsening Chronicity:  New Associated symptoms: nausea   Associated symptoms: no chills, no vaginal bleeding, no vaginal discharge and no vomiting   Patient seen by her PCP on Wednesday, diagnosed with UTI, started on antibiotic.  Patient continues to have suprapubic/pelvic pain.  Pain is different from her prior abdominal pain.  Denies vaginal discharge or bleeding.  Does not have uterus or ovaries.  Past Medical History  Diagnosis Date  . Arrhythmia   . Diverticulitis   . Depression   . Headache(784.0)   . Anxiety   . HLD (hyperlipidemia)    Past Surgical History  Procedure Laterality Date  . Tonsillectomy    . Abdominal hysterectomy    . Breast enhancement surgery    . Rhinoplasty     Family History  Problem Relation Age of Onset  . Lung cancer Mother   . Heart attack Father   . Hypertension Brother   . Esophageal cancer Brother    History  Substance Use Topics  . Smoking status: Former Smoker    Types: Cigarettes    Quit date: 06/30/2011  . Smokeless tobacco: Never Used  . Alcohol Use: 0.5 oz/week    1 drink(s) per week     Comment: GLASS OF WINE WITH DINNER- 2-3 times a week   OB History   Grav Para Term Preterm Abortions TAB SAB Ect Mult Living                 Review of Systems  Constitutional: Negative for  chills.  Gastrointestinal: Positive for nausea and abdominal pain. Negative for vomiting.  Genitourinary: Positive for pelvic pain. Negative for flank pain, vaginal bleeding and vaginal discharge.  All other systems reviewed and are negative.      Allergies  Penicillins  Home Medications   Current Outpatient Rx  Name  Route  Sig  Dispense  Refill  . aspirin EC 81 MG tablet   Oral   Take 81 mg by mouth daily.         Marland Kitchen atorvastatin (LIPITOR) 20 MG tablet   Oral   Take 20 mg by mouth at bedtime.         Marland Kitchen buPROPion (WELLBUTRIN XL) 150 MG 24 hr tablet   Oral   Take 150 mg by mouth daily. Taken with 320m tablet to equal 4538mdaily.         . Marland KitchenuPROPion (WELLBUTRIN XL) 300 MG 24 hr tablet   Oral   Take 300 mg by mouth daily. Taken with 15032mablet to equal 450m35mily.         . Coenzyme Q10 (CO Q 10 PO)   Oral   Take 1 tablet by mouth daily.         . FLMarland Kitchenoxetine (PROZAC) 20 MG capsule   Oral   Take 60 mg by mouth daily.          .Marland Kitchen  ibuprofen (ADVIL,MOTRIN) 200 MG tablet   Oral   Take 600 mg by mouth daily as needed for mild pain.         Marland Kitchen MOVIPREP 100 G SOLR   Oral   Take 1 kit (200 g total) by mouth once.   1 kit   0     Dispense as written.   . Multiple Vitamin (MULTIVITAMIN WITH MINERALS) TABS tablet   Oral   Take 1 tablet by mouth daily.         . traZODone (DESYREL) 100 MG tablet   Oral   Take 200 mg by mouth at bedtime.          . triamcinolone (NASACORT ALLERGY 24HR) 55 MCG/ACT AERO nasal inhaler   Nasal   Place 2 sprays into the nose daily as needed (for allergies).          BP 158/56  Pulse 82  Temp(Src) 98.4 F (36.9 C) (Oral)  Resp 16  SpO2 95% Physical Exam  Nursing note and vitals reviewed. Constitutional: She is oriented to person, place, and time. She appears well-developed and well-nourished. No distress.  HENT:  Head: Normocephalic.  Eyes: Pupils are equal, round, and reactive to light.  Neck: Normal range  of motion.  Cardiovascular: Normal rate and regular rhythm.   Pulmonary/Chest: Effort normal and breath sounds normal.  Abdominal: Soft. Bowel sounds are normal. There is tenderness in the suprapubic area. There is no rebound, no guarding and no CVA tenderness.    Musculoskeletal: She exhibits no edema and no tenderness.  Lymphadenopathy:    She has no cervical adenopathy.  Neurological: She is alert and oriented to person, place, and time.  Skin: Skin is warm and dry.  Psychiatric: She has a normal mood and affect. Her behavior is normal. Judgment and thought content normal.    ED Course  Procedures (including critical care time) Labs Review Labs Reviewed  CBC WITH DIFFERENTIAL  BASIC METABOLIC PANEL  URINALYSIS, ROUTINE W REFLEX MICROSCOPIC   Imaging Review No results found.   EKG Interpretation None     Patient discussed with and seen by Dr. Tawnya Crook.  Will obtain CT scan.  Lab and radiology results reviewed and shared with patient.  Will initiate antibiotic treatment for diverticulitis.  Patient is scheduled to follow-up with gastroenterology. MDM   Final diagnoses:  None    Diverticulitis.    Norman Herrlich, NP 10/28/13 2046

## 2013-10-28 NOTE — Discharge Instructions (Signed)
Diverticulitis °A diverticulum is a small pouch or sac on the colon. Diverticulosis is the presence of these diverticula on the colon. Diverticulitis is the irritation (inflammation) or infection of diverticula. °CAUSES  °The colon and its diverticula contain bacteria. If food particles block the tiny opening to a diverticulum, the bacteria inside can grow and cause an increase in pressure. This leads to infection and inflammation and is called diverticulitis. °SYMPTOMS  °· Abdominal pain and tenderness. Usually, the pain is located on the left side of your abdomen. However, it could be located elsewhere. °· Fever. °· Bloating. °· Feeling sick to your stomach (nausea). °· Throwing up (vomiting). °· Abnormal stools. °DIAGNOSIS  °Your caregiver will take a history and perform a physical exam. Since many things can cause abdominal pain, other tests may be necessary. Tests may include: °· Blood tests. °· Urine tests. °· X-ray of the abdomen. °· CT scan of the abdomen. °Sometimes, surgery is needed to determine if diverticulitis or other conditions are causing your symptoms. °TREATMENT  °Most of the time, you can be treated without surgery. Treatment includes: °· Resting the bowels by only having liquids for a few days. As you improve, you will need to eat a low-fiber diet. °· Intravenous (IV) fluids if you are losing body fluids (dehydrated). °· Antibiotic medicines that treat infections may be given. °· Pain and nausea medicine, if needed. °· Surgery if the inflamed diverticulum has burst. °HOME CARE INSTRUCTIONS  °· Try a clear liquid diet (broth, tea, or water for as long as directed by your caregiver). You may then gradually begin a low-fiber diet as tolerated.  °A low-fiber diet is a diet with less than 10 grams of fiber. Choose the foods below to reduce fiber in the diet: °· White breads, cereals, rice, and pasta. °· Cooked fruits and vegetables or soft fresh fruits and vegetables without the skin. °· Ground or  well-cooked tender beef, ham, veal, lamb, pork, or poultry. °· Eggs and seafood. °· After your diverticulitis symptoms have improved, your caregiver may put you on a high-fiber diet. A high-fiber diet includes 14 grams of fiber for every 1000 calories consumed. For a standard 2000 calorie diet, you would need 28 grams of fiber. Follow these diet guidelines to help you increase the fiber in your diet. It is important to slowly increase the amount fiber in your diet to avoid gas, constipation, and bloating. °· Choose whole-grain breads, cereals, pasta, and brown rice. °· Choose fresh fruits and vegetables with the skin on. Do not overcook vegetables because the more vegetables are cooked, the more fiber is lost. °· Choose more nuts, seeds, legumes, dried peas, beans, and lentils. °· Look for food products that have greater than 3 grams of fiber per serving on the Nutrition Facts label. °· Take all medicine as directed by your caregiver. °· If your caregiver has given you a follow-up appointment, it is very important that you go. Not going could result in lasting (chronic) or permanent injury, pain, and disability. If there is any problem keeping the appointment, call to reschedule. °SEEK MEDICAL CARE IF:  °· Your pain does not improve. °· You have a hard time advancing your diet beyond clear liquids. °· Your bowel movements do not return to normal. °SEEK IMMEDIATE MEDICAL CARE IF:  °· Your pain becomes worse. °· You have an oral temperature above 102° F (38.9° C), not controlled by medicine. °· You have repeated vomiting. °· You have bloody or black, tarry stools. °·   Symptoms that brought you to your caregiver become worse or are not getting better. °MAKE SURE YOU:  °· Understand these instructions. °· Will watch your condition. °· Will get help right away if you are not doing well or get worse. °Document Released: 04/23/2005 Document Revised: 10/06/2011 Document Reviewed: 08/19/2010 °[employer]® Patient Information  ©2014 [employer]. ° °

## 2013-10-29 NOTE — ED Provider Notes (Signed)
Medical screening examination/treatment/procedure(s) were conducted as a shared visit with non-physician practitioner(s) and myself.  I personally evaluated the patient during the encounter. Pt presents w/ several days of lower abdominal, worse w/ mvmt, palpation. On PE, VSS, pt in NAD, she has suprapubic, RLQ pain w/o rebound or guarding but does seem fairly uncomfortable. CT ab/pelvis shows colitis. As pt non-toxic, HDS, I feel she is safe for outpt tx.    EKG Interpretation None        Neta Ehlers, MD 10/29/13 0003

## 2013-11-07 ENCOUNTER — Encounter: Payer: Self-pay | Admitting: Internal Medicine

## 2013-11-08 ENCOUNTER — Ambulatory Visit (INDEPENDENT_AMBULATORY_CARE_PROVIDER_SITE_OTHER): Payer: BC Managed Care – PPO | Admitting: Internal Medicine

## 2013-11-08 ENCOUNTER — Encounter: Payer: Self-pay | Admitting: Internal Medicine

## 2013-11-08 VITALS — BP 114/70 | HR 80 | Ht 64.5 in | Wt 156.4 lb

## 2013-11-08 DIAGNOSIS — R933 Abnormal findings on diagnostic imaging of other parts of digestive tract: Secondary | ICD-10-CM

## 2013-11-08 DIAGNOSIS — K5732 Diverticulitis of large intestine without perforation or abscess without bleeding: Secondary | ICD-10-CM

## 2013-11-08 DIAGNOSIS — K219 Gastro-esophageal reflux disease without esophagitis: Secondary | ICD-10-CM

## 2013-11-08 MED ORDER — CIPROFLOXACIN HCL 500 MG PO TABS: 500.0000 mg | ORAL_TABLET | Freq: Two times a day (BID) | ORAL | Status: DC

## 2013-11-08 NOTE — Patient Instructions (Signed)
We have sent the following medications to your pharmacy for you to pick up at your convenience:  Cipro

## 2013-11-08 NOTE — Progress Notes (Signed)
HISTORY OF PRESENT ILLNESS:  Dorothy White is a 63 y.o. female evaluated 10/12/2013 for chronic constipation, intermittent left lower quadrant pain, history of diverticulitis, screening colonoscopy, and GERD with intermittent dysphagia. She was advised to use MiraLax for constipation, secure PCP, medical therapy for GERD, and set up for colonoscopy and upper endoscopy. See that dictation. 2 weeks ago she developed worsening left lower quadrant pain. She was evaluated in the emergency room. Blood work was unremarkable. CT scan (reviewed) showed left-sided diverticulosis with mild diverticulitis. She was treated with ciprofloxacin and metronidazole. Medications upset her stomach. She is penicillin allergic. Symptoms have improved, though she still has left lower quadrant discomfort. No fevers. Moving bowels, but thin caliber. Using MiraLax. No other issues. She is already set up for colonoscopy and EGD May 7.  REVIEW OF SYSTEMS:  All non-GI ROS negative except for fatigue  Past Medical History  Diagnosis Date  . Arrhythmia   . Diverticulitis   . Depression   . Headache(784.0)   . Anxiety   . HLD (hyperlipidemia)     Past Surgical History  Procedure Laterality Date  . Tonsillectomy    . Abdominal hysterectomy    . Breast enhancement surgery    . Rhinoplasty    . Oophorectomy Bilateral   . Bunionectomy Bilateral     Social History Dorothy White  reports that she quit smoking about 2 years ago. Her smoking use included Cigarettes. She smoked 0.00 packs per day. She has never used smokeless tobacco. She reports that she drinks about .5 ounces of alcohol per week. She reports that she does not use illicit drugs.  family history includes Diabetes in her paternal grandmother; Esophageal cancer in her brother; Heart attack in her father; Hypertension in her brother; Lung cancer in her mother. There is no history of Colon cancer, Kidney disease, or Liver disease.  Allergies  Allergen  Reactions  . Penicillins Hives       PHYSICAL EXAMINATION: Vital signs: BP 114/70  Pulse 80  Ht 5' 4.5" (1.638 m)  Wt 156 lb 6.4 oz (70.943 kg)  BMI 26.44 kg/m2 General: Well-developed, well-nourished, no acute distress HEENT: Sclerae are anicteric, conjunctiva pink. Oral mucosa intact Lungs: Clear Heart: Regular Abdomen: soft, left lower quadrant discomfort with palpation. No rebound, nondistended, no obvious ascites, no peritoneal signs, normal bowel sounds. No organomegaly. Extremities: No edema Psychiatric: alert and oriented x3. Cooperative   ASSESSMENT:  #1. Acute diverticulitis. Recent bout. Improve after antibiotics x10 days. Still with residual pain. May be due to the same #2. Chronic constipation #3. GERD with mild dysphagia   PLAN:  #1. Prescribe additional antibiotic in the form of ciprofloxacin 500 mg twice a day x14 days. No metronidazole, as I suspect this is what set her stomach #2. Continue MiraLax to keep bowels regular #3. Keep plans for colonoscopy and upper endoscopy. Contact the office in the interim if needed

## 2013-11-17 ENCOUNTER — Encounter: Payer: Self-pay | Admitting: Neurology

## 2013-11-18 IMAGING — CR DG LUMBAR SPINE COMPLETE 4+V
5 series · 5 of 5 positions shown · non-contrast
Comparison: None.

CLINICAL DATA: Fall, back pain.

LUMBAR SPINE - COMPLETE 4+ VIEW

[t lumbar spine ap]
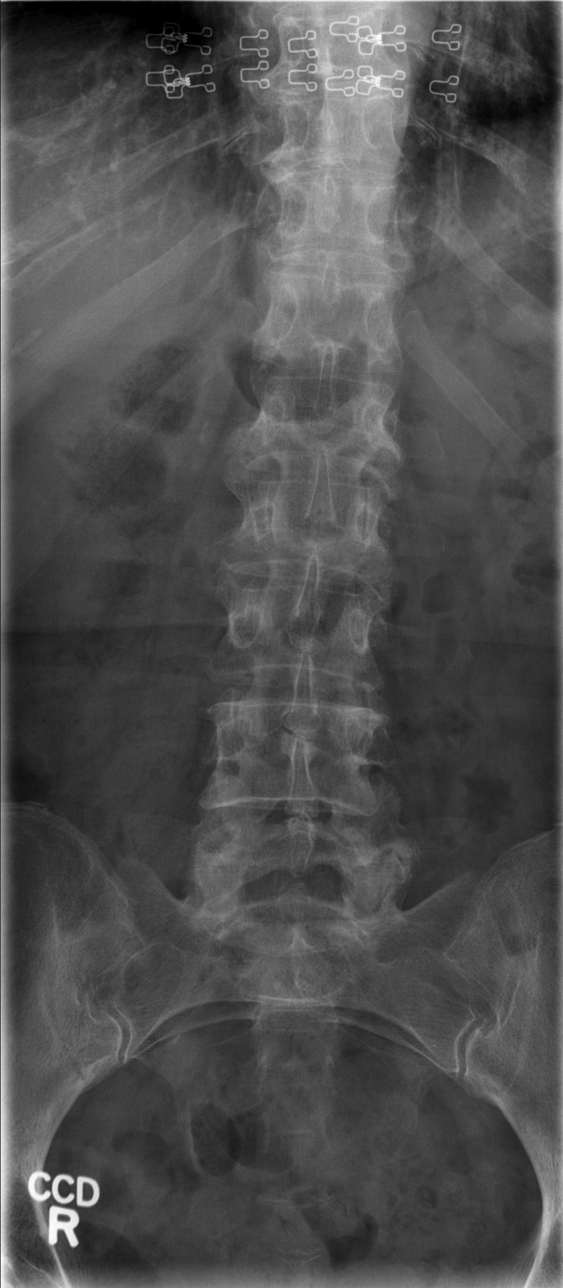

[t lumbar spine obl (1 of 2)]
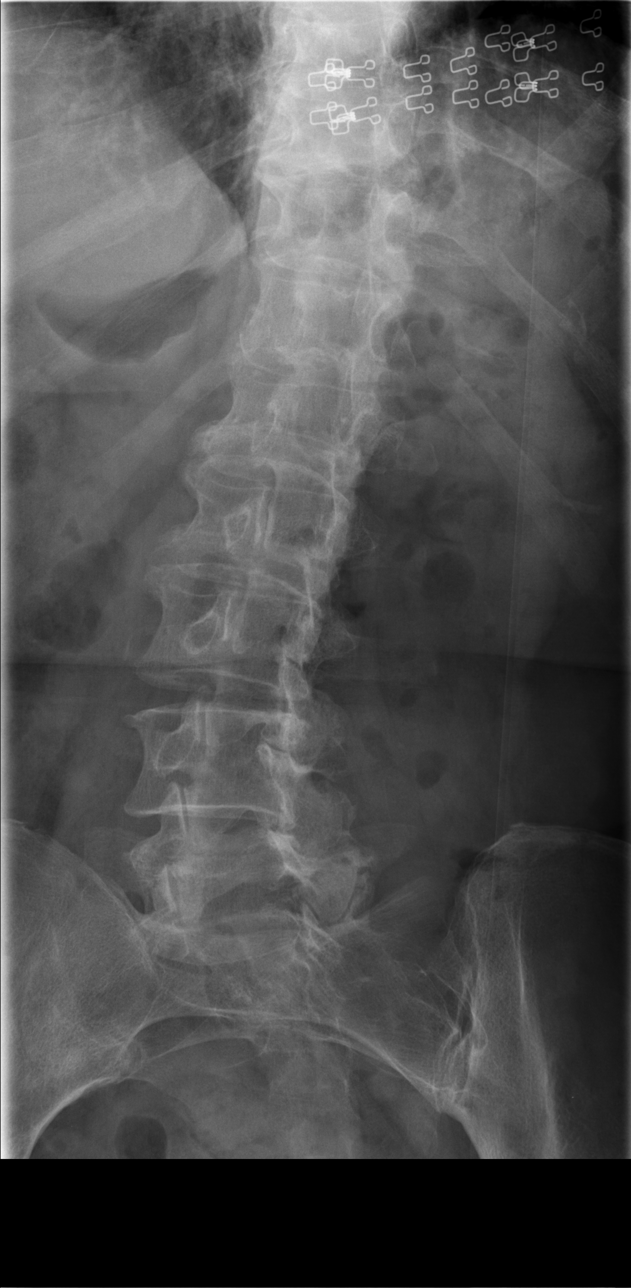

[t lumbar spine obl (2 of 2)]
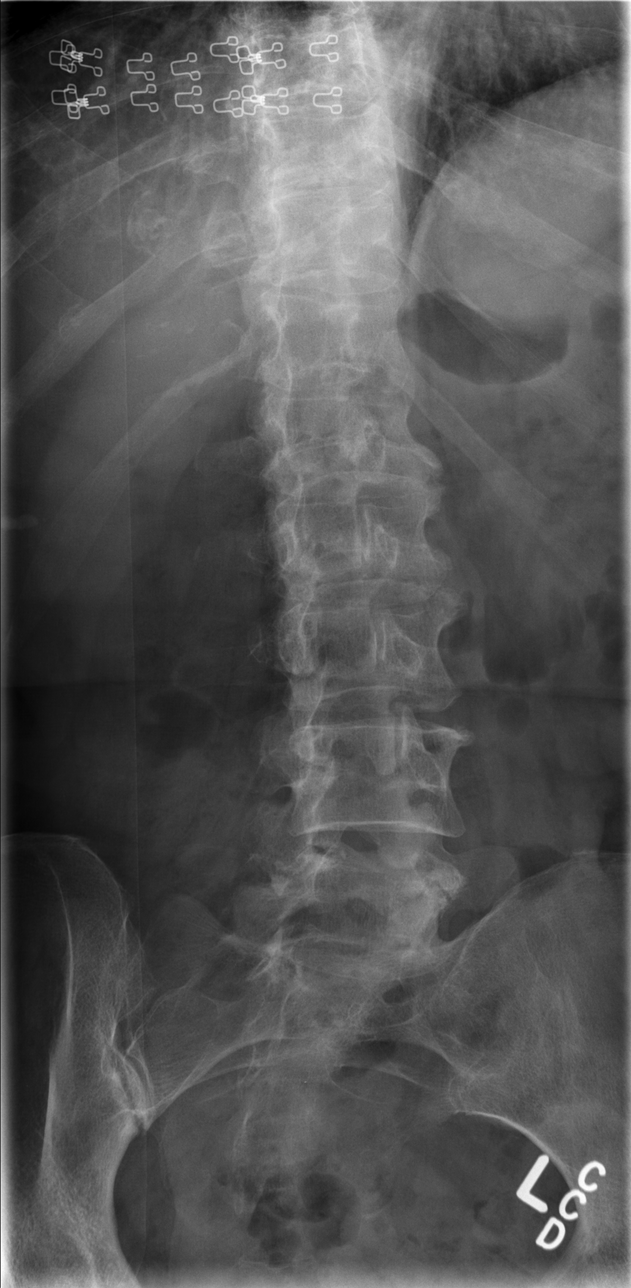

[t lumbar spine lat]
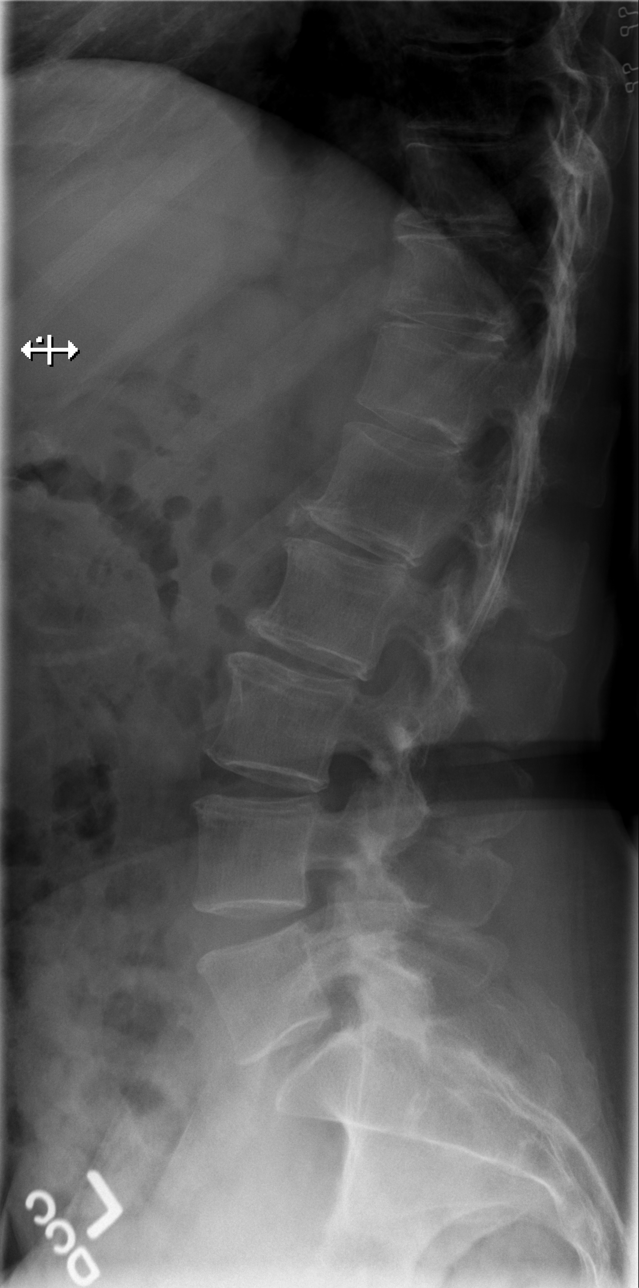

[t lumbar l-5 s-1 spot]
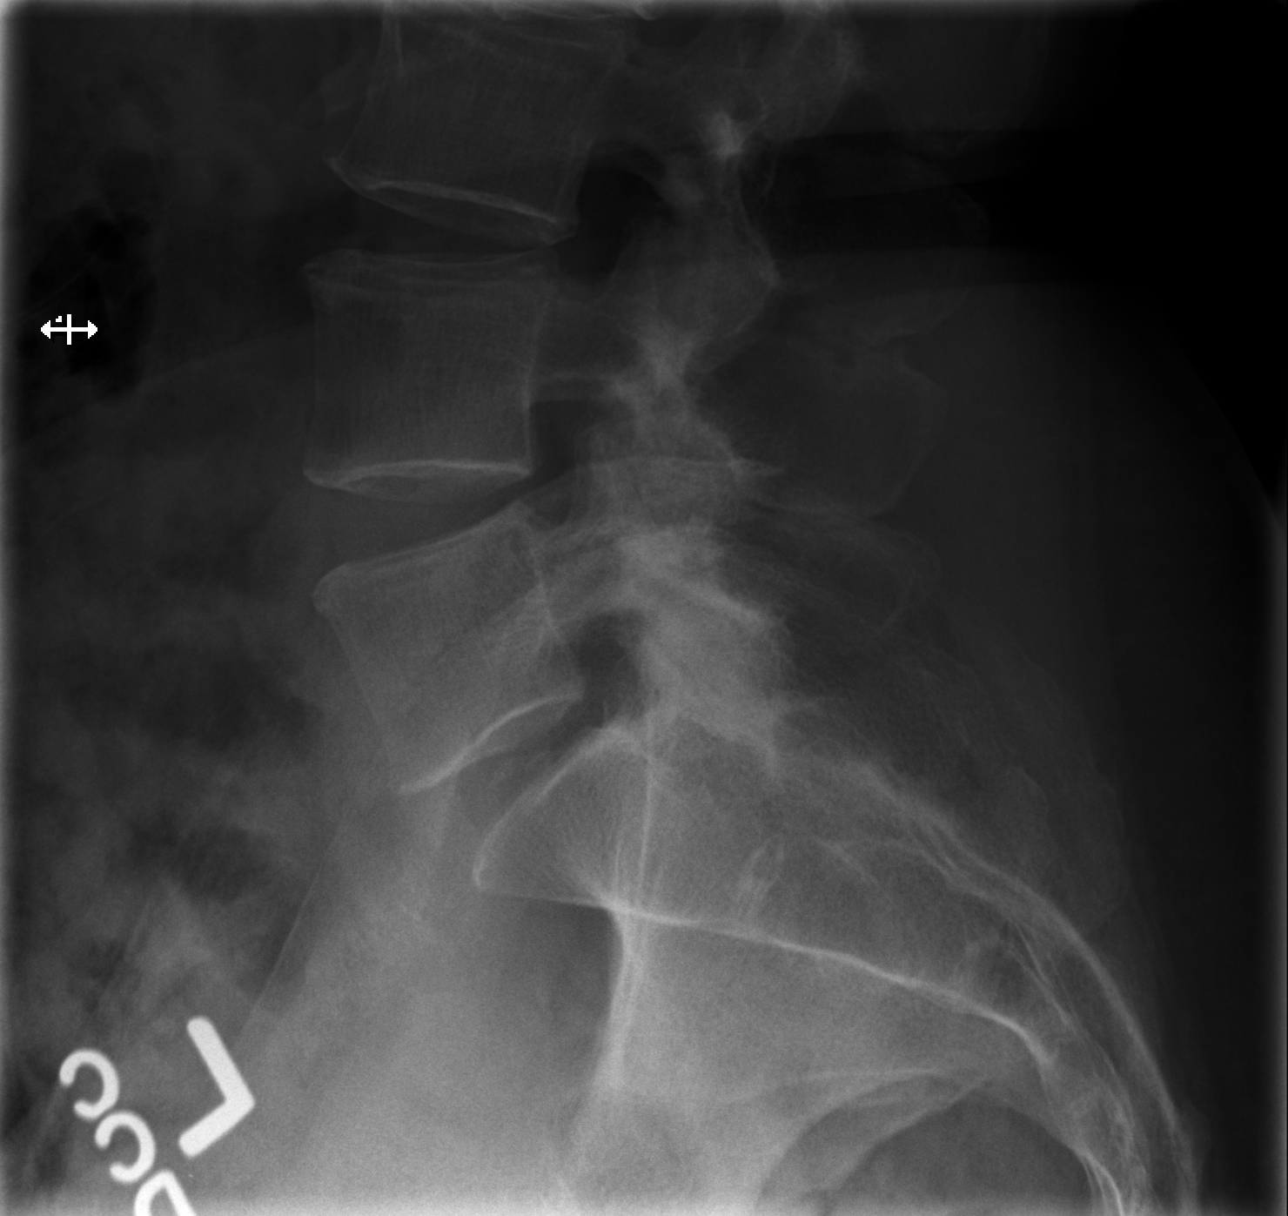

[5 of 5 positions shown; findings below may reference images not displayed]

FINDINGS: Degenerative facet disease in the mid and lower lumbar
spine.  Early degenerative disc disease.  Normal alignment.  No
fracture.  SI joints are symmetric and unremarkable.
IMPRESSION: Spondylosis.  No acute findings.

## 2013-11-18 IMAGING — CR DG SACRUM/COCCYX 2+V
3 series · 3 of 3 positions shown · non-contrast
Comparison: CT 07/21/2011

CLINICAL DATA: Fall, tail bone pain.

SACRUM AND COCCYX - 2+ VIEW

[t sacrum ap]
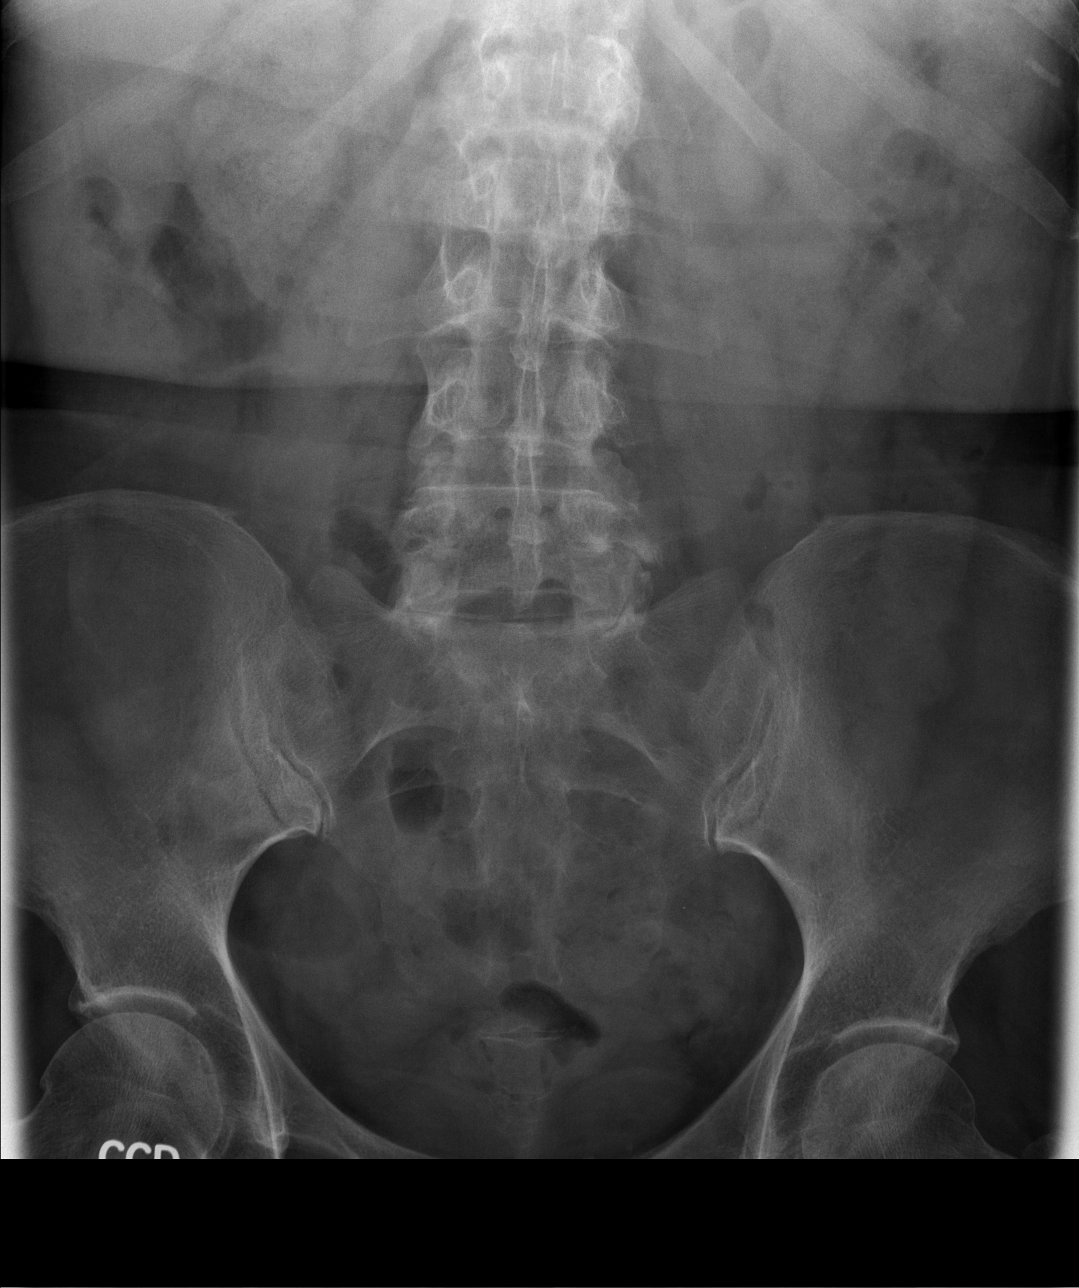

[t coccyx ap]
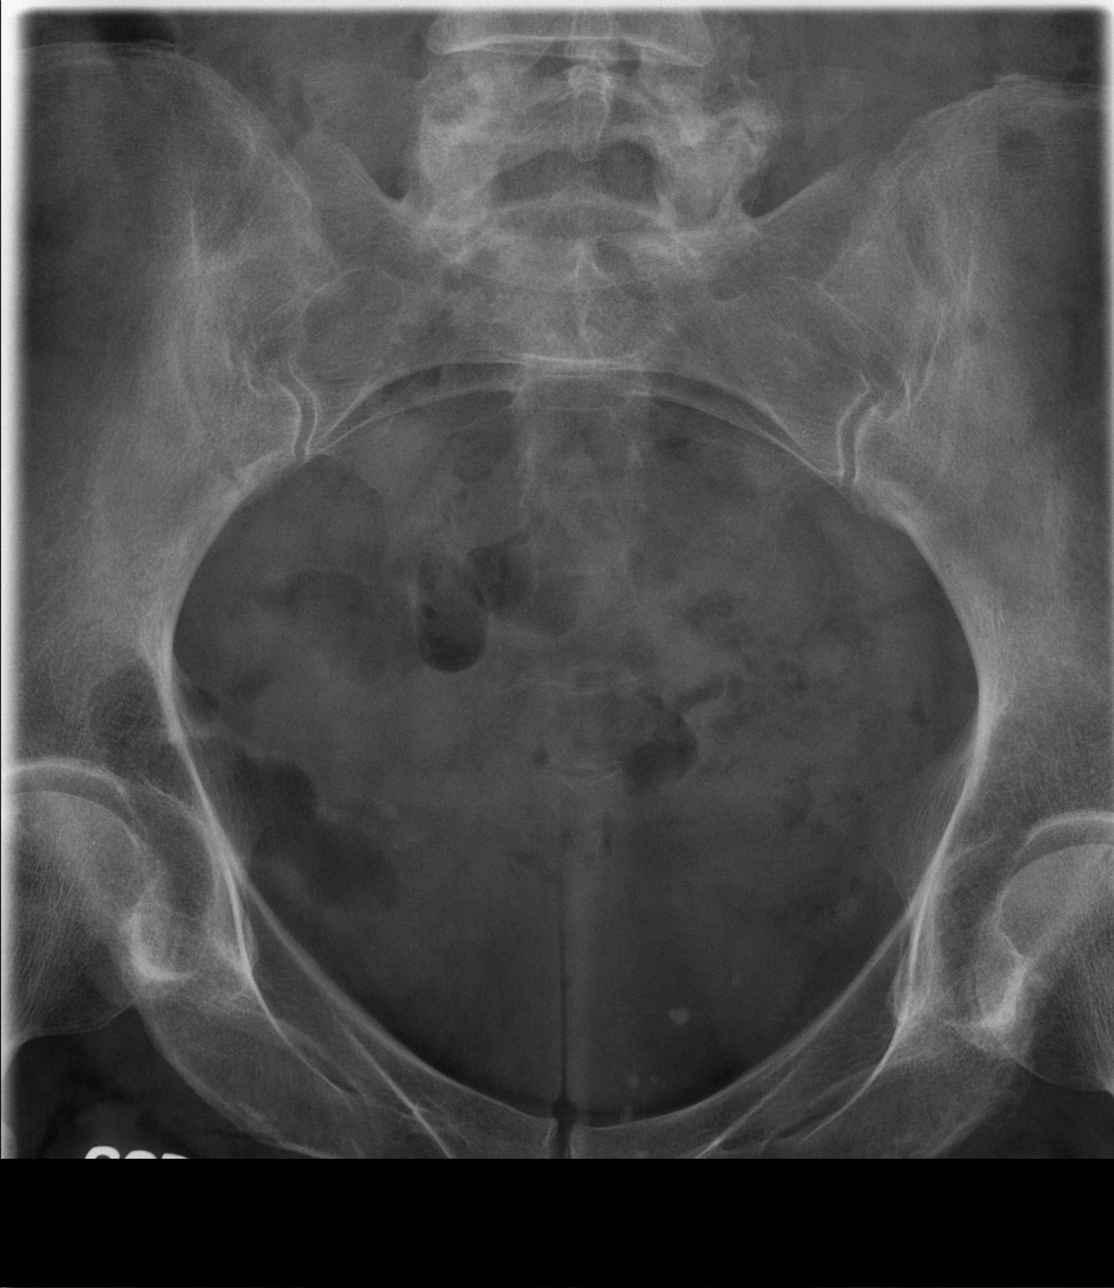

[t sacrum coccyx lat]
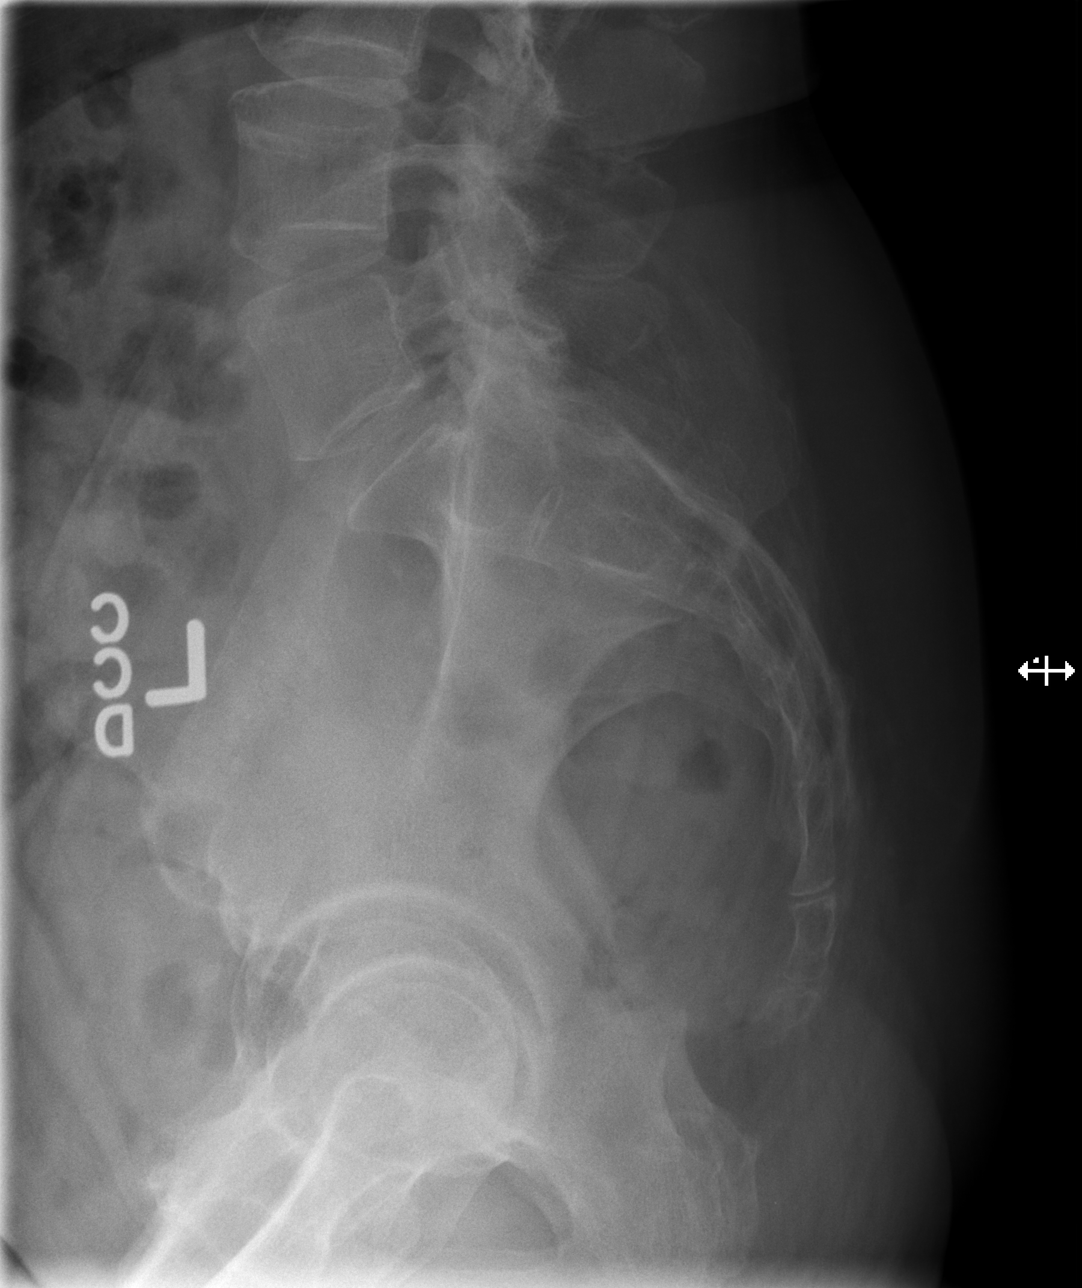

[3 of 3 positions shown; findings below may reference images not displayed]

FINDINGS: No acute bony abnormality.  No sacral or coccygeal
fracture. Hip joints and SI joints are symmetric and unremarkable.
IMPRESSION: No acute bony abnormality.

## 2013-11-18 NOTE — Telephone Encounter (Signed)
Carolyn:  Please address her email attached below

## 2013-11-24 ENCOUNTER — Encounter: Payer: BC Managed Care – PPO | Admitting: Internal Medicine

## 2013-12-01 ENCOUNTER — Ambulatory Visit (AMBULATORY_SURGERY_CENTER): Payer: BC Managed Care – PPO | Admitting: Internal Medicine

## 2013-12-01 ENCOUNTER — Encounter: Payer: Self-pay | Admitting: Internal Medicine

## 2013-12-01 VITALS — BP 126/71 | HR 60 | Temp 98.8°F | Resp 29 | Ht 64.5 in | Wt 156.0 lb

## 2013-12-01 DIAGNOSIS — K5732 Diverticulitis of large intestine without perforation or abscess without bleeding: Secondary | ICD-10-CM

## 2013-12-01 DIAGNOSIS — R1310 Dysphagia, unspecified: Secondary | ICD-10-CM

## 2013-12-01 DIAGNOSIS — Z1211 Encounter for screening for malignant neoplasm of colon: Secondary | ICD-10-CM

## 2013-12-01 DIAGNOSIS — K219 Gastro-esophageal reflux disease without esophagitis: Secondary | ICD-10-CM

## 2013-12-01 MED ORDER — SODIUM CHLORIDE 0.9 % IV SOLN: 500.0000 mL | INTRAVENOUS | Status: DC

## 2013-12-01 NOTE — Op Note (Signed)
[place]  [address] [place], [id number]   COLONOSCOPY PROCEDURE REPORT  PATIENT: Dorothy White, Dorothy White  MR#: [id number] BIRTHDATE: 1951/01/28 , 57  yrs. old GENDER: Female ENDOSCOPIST: Eustace Quail, MD REFERRED BY:.  Self / Office PROCEDURE DATE:  12/01/2013 PROCEDURE:   Colonoscopy, screening First Screening Colonoscopy - Avg.  risk and is 50 yrs.  old or older - No.  Prior Negative Screening - Now for repeat screening. N/A  History of Adenoma - Now for follow-up colonoscopy & has been > or = to 3 yrs.  N/A  Polyps Removed Today? No.  Recommend repeat exam, <10 yrs? No. ASA CLASS:   Class II INDICATIONS:average risk screening. . Colonoscopy in 1999 with diverticulosis. Recent bout of treated diverticulitis resolved MEDICATIONS: MAC sedation, administered by CRNA and propofol (Diprivan) 300mg  IV  DESCRIPTION OF PROCEDURE:   After the risks benefits and alternatives of the procedure were thoroughly explained, informed consent was obtained.  A digital rectal exam revealed no abnormalities of the rectum.   The [id number]  endoscope was introduced through the anus and advanced to the cecum, which was identified by both the appendix and ileocecal valve. No adverse events experienced.   The quality of the prep was excellent, using MoviPrep  The instrument was then slowly withdrawn as the colon was fully examined.      COLON FINDINGS: Severe diverticulosis was noted throughout the entire examined colon, but particularly on the left side.   The colon was otherwise normal.  There was no diverticulosis, inflammation, polyps or cancers unless previously stated. Retroflexed views revealed internal hemorrhoids. The time to cecum=3 minutes 25 seconds.  Withdrawal time=10 minutes 20 seconds. The scope was withdrawn and the procedure completed. COMPLICATIONS: There were no complications.  ENDOSCOPIC IMPRESSION: 1.   Severe diverticulosis was noted  throughout the entire examined colon 2.   The colon was otherwise normal  RECOMMENDATIONS: 1.  Continue current colorectal screening recommendations for "routine risk" patients with a repeat colonoscopy in 10 years. 2.  Upper endoscopy today (see report)   eSigned:  Eustace Quail, MD 12/01/2013 4:38 PM   cc: The Patient    ; Maurice Small

## 2013-12-01 NOTE — Progress Notes (Signed)
No complaints noted in the recovery room. Maw   

## 2013-12-01 NOTE — Patient Instructions (Addendum)
YOU HAD AN ENDOSCOPIC PROCEDURE TODAY AT THE [facility]: Refer to the procedure report that was given to you for any specific questions about what was found during the examination.  If the procedure report does not answer your questions, please call your gastroenterologist to clarify.  If you requested that your care partner not be given the details of your procedure findings, then the procedure report has been included in a sealed envelope for you to review at your convenience later.  YOU SHOULD EXPECT: Some feelings of bloating in the abdomen. Passage of more gas than usual.  Walking can help get rid of the air that was put into your GI tract during the procedure and reduce the bloating. If you had a lower endoscopy (such as a colonoscopy or flexible sigmoidoscopy) you may notice spotting of blood in your stool or on the toilet paper. If you underwent a bowel prep for your procedure, then you may not have a normal bowel movement for a few days.  DIET: Your first meal following the procedure should be a light meal and then it is ok to progress to your normal diet.  A half-sandwich or bowl of soup is an example of a good first meal.  Heavy or fried foods are harder to digest and may make you feel nauseous or bloated.  Likewise meals heavy in dairy and vegetables can cause extra gas to form and this can also increase the bloating.  Drink plenty of fluids but you should avoid alcoholic beverages for 24 hours.  ACTIVITY: Your care partner should take you home directly after the procedure.  You should plan to take it easy, moving slowly for the rest of the day.  You can resume normal activity the day after the procedure however you should NOT DRIVE or use heavy machinery for 24 hours (because of the sedation medicines used during the test).    SYMPTOMS TO REPORT IMMEDIATELY: A gastroenterologist can be reached at any hour.  During normal business hours, 8:30 AM to 5:00 PM Monday through Friday,  call [phone].  After hours and on weekends, please call the GI answering service at [phone] who will take a message and have the physician on call contact you.   Following lower endoscopy (colonoscopy or flexible sigmoidoscopy):  Excessive amounts of blood in the stool  Significant tenderness or worsening of abdominal pains  Swelling of the abdomen that is new, acute  Fever of 100F or higher  Following upper endoscopy (EGD)  Vomiting of blood or coffee ground material  New chest pain or pain under the shoulder blades  Painful or persistently difficult swallowing  New shortness of breath  Fever of 100F or higher  Black, tarry-looking stools  FOLLOW UP: If any biopsies were taken you will be contacted by phone or by letter within the next 1-3 weeks.  Call your gastroenterologist if you have not heard about the biopsies in 3 weeks.  Our staff will call the home number listed on your records the next business day following your procedure to check on you and address any questions or concerns that you may have at that time regarding the information given to you following your procedure. This is a courtesy call and so if there is no answer at the home number and we have not heard from you through the emergency physician on call, we will assume that you have returned to your regular daily activities without incident.  SIGNATURES/CONFIDENTIALITY: You and/or your care   partner have signed paperwork which will be entered into your electronic medical record.  These signatures attest to the fact that that the information above on your After Visit Summary has been reviewed and is understood.  Full responsibility of the confidentiality of this discharge information lies with you and/or your care-partner.    Handouts were given to your care partner on diverticulosis, a high fiber diet with liberal fluid intake, hemorrhoids and GERD with hiatal hernia. You may resume your current medications  today.  Per Dr. Henrene Pastor use over-the-counter acid suppressant medication as needed for symptoms. Please call if any questions or concerns.

## 2013-12-01 NOTE — Op Note (Signed)
[place]  [address] [place], [id number]   ENDOSCOPY PROCEDURE REPORT  PATIENT: Dorothy White, Dorothy White  MR#: [id number] BIRTHDATE: [birth year] , 18  yrs. old GENDER: Female ENDOSCOPIST: Eustace Quail, MD REFERRED BY:  .  Self / Office PROCEDURE DATE:  12/01/2013 PROCEDURE:  EGD, diagnostic ASA CLASS:     Class II INDICATIONS:  History of esophageal reflux.   Dysphagia(vague and mild). MEDICATIONS: MAC sedation, administered by CRNA and propofol (Diprivan) 100mg  IV TOPICAL ANESTHETIC: none  DESCRIPTION OF PROCEDURE: After the risks benefits and alternatives of the procedure were thoroughly explained, informed consent was obtained.  The LB [id number] endoscope was introduced through the mouth and advanced to the second portion of the duodenum. Without limitations.  The instrument was slowly withdrawn as the mucosa was fully examined.      The upper, middle and distal third of the esophagus were carefully inspected and no abnormalities were noted.  The z-line was well seen at the GEJ.  The endoscope was pushed into the fundus which was normal including a retroflexed view.  The antrum, gastric body, first and second part of the duodenum were unremarkable. Retroflexed views revealed a hiatal hernia.     The scope was then withdrawn from the patient and the procedure completed.  COMPLICATIONS: There were no complications. ENDOSCOPIC IMPRESSION: 1. Normal EGD 2. GERD  RECOMMENDATIONS: 1.  Anti-reflux regimen to be followed 2.  use over-the-counter acid suppressant medication as needed for symptoms  REPEAT EXAM:  eSigned:  Eustace Quail, MD 12/01/2013 4:43 PM   CC:The Patient and Maurice Small

## 2013-12-02 ENCOUNTER — Telehealth: Payer: Self-pay | Admitting: *Deleted

## 2013-12-02 NOTE — Telephone Encounter (Signed)
Number identifier, left message, follow-up  

## 2014-01-17 ENCOUNTER — Other Ambulatory Visit: Payer: Self-pay | Admitting: Family Medicine

## 2014-01-17 ENCOUNTER — Ambulatory Visit
Admission: RE | Admit: 2014-01-17 | Discharge: 2014-01-17 | Disposition: A | Discharge status: Home / Self Care | Payer: BC Managed Care – PPO | Source: Ambulatory Visit | Attending: Family Medicine | Admitting: Family Medicine

## 2014-01-17 DIAGNOSIS — R609 Edema, unspecified: Secondary | ICD-10-CM

## 2014-01-17 DIAGNOSIS — M79604 Pain in right leg: Secondary | ICD-10-CM

## 2014-01-17 DIAGNOSIS — M25561 Pain in right knee: Secondary | ICD-10-CM

## 2014-01-17 DIAGNOSIS — M25562 Pain in left knee: Principal | ICD-10-CM

## 2014-01-17 DIAGNOSIS — M79605 Pain in left leg: Principal | ICD-10-CM

## 2014-01-18 ENCOUNTER — Ambulatory Visit
Admission: RE | Admit: 2014-01-18 | Discharge: 2014-01-18 | Disposition: A | Discharge status: Home / Self Care | Payer: BC Managed Care – PPO | Source: Ambulatory Visit | Attending: Family Medicine | Admitting: Family Medicine

## 2014-01-18 DIAGNOSIS — M79604 Pain in right leg: Secondary | ICD-10-CM

## 2014-01-18 DIAGNOSIS — R609 Edema, unspecified: Secondary | ICD-10-CM

## 2014-01-18 DIAGNOSIS — M79605 Pain in left leg: Principal | ICD-10-CM

## 2014-03-04 ENCOUNTER — Other Ambulatory Visit: Payer: Self-pay | Admitting: Nurse Practitioner

## 2014-03-05 NOTE — Telephone Encounter (Signed)
Per note on 01/21

## 2014-03-08 ENCOUNTER — Other Ambulatory Visit: Payer: Self-pay | Admitting: Family Medicine

## 2014-03-08 DIAGNOSIS — K5712 Diverticulitis of small intestine without perforation or abscess without bleeding: Secondary | ICD-10-CM

## 2014-03-17 IMAGING — CT CT HEAD W/O CM
2 series · 16 of 30 positions shown, 20 images · non-contrast
Comparison: Head CT scan 11/19/2011.

CLINICAL DATA: Dizziness and lightheadedness.

CT HEAD WITHOUT CONTRAST
TECHNIQUE: Contiguous axial images were obtained from the base of
the skull through the vertex without contrast.

[Series 2: head w/o · axial · non-contrast · 0.43mm/px · z∈[+1599,+1719]mm · 13 of 29 slices shown, 17 images]
[im 3/29  brain]
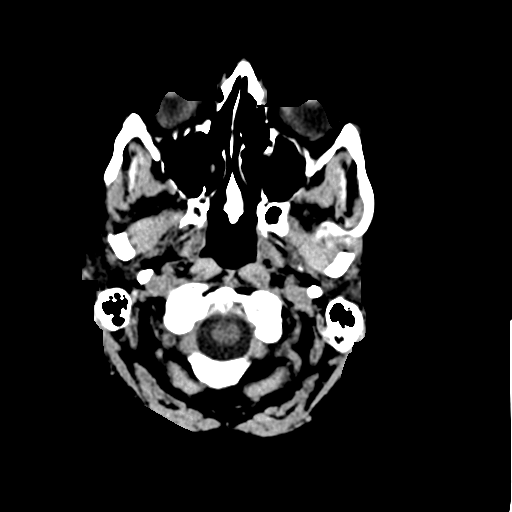
[im 3/29  bone]
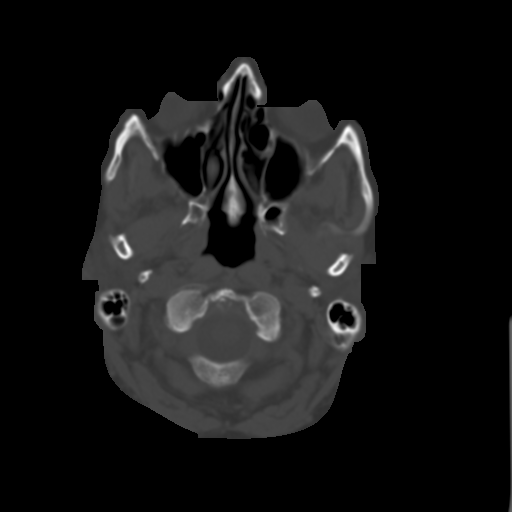
[im 5/29  brain]
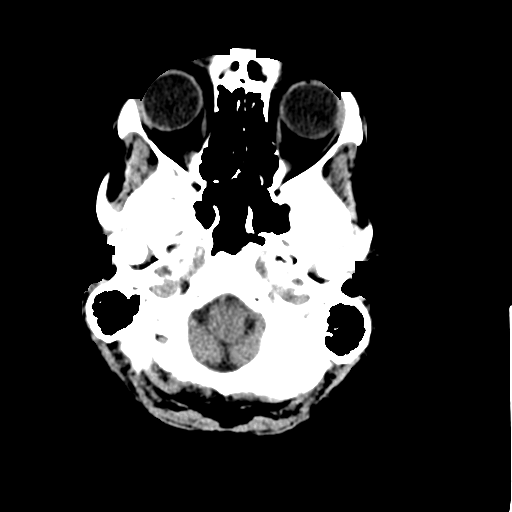
[im 7/29  brain]
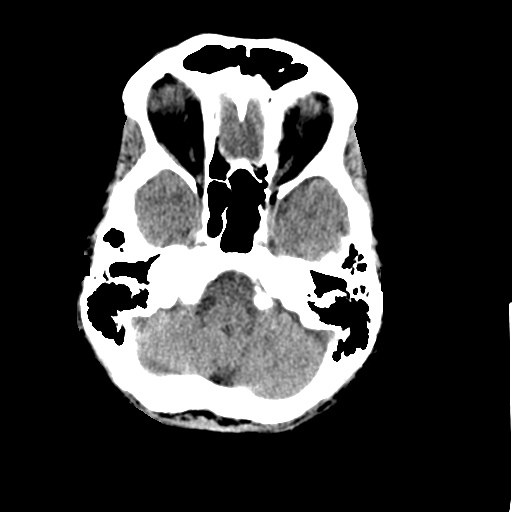
[im 9/29  brain]
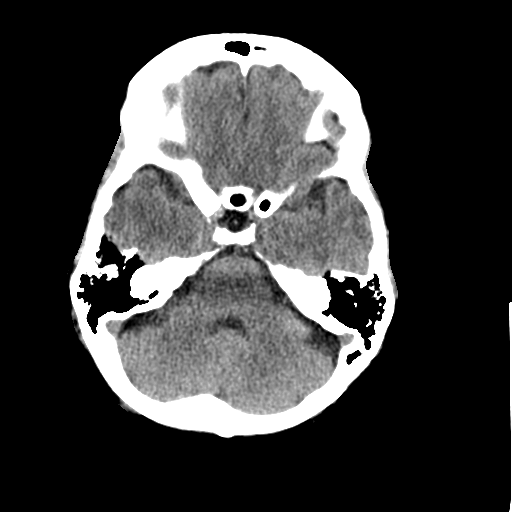
[im 11/29  brain]
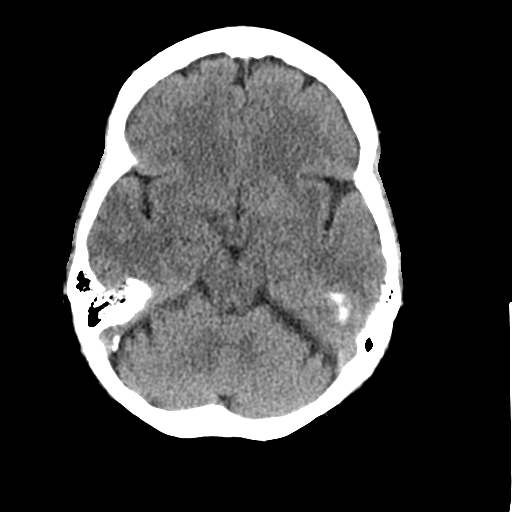
[im 11/29  bone]
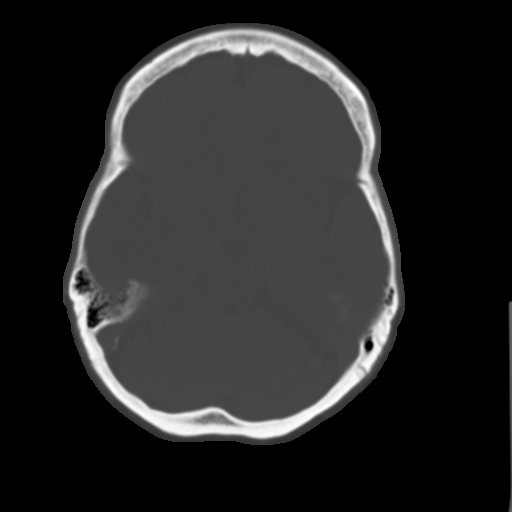
[im 13/29  brain]
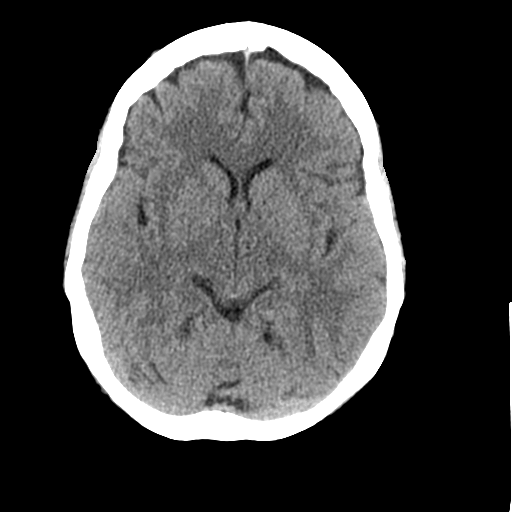
[im 15/29  brain]
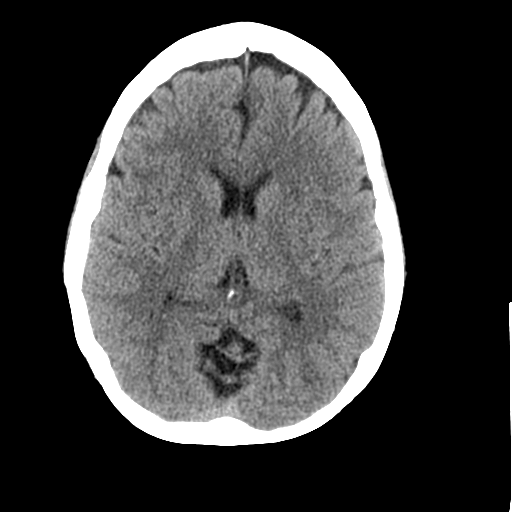
[im 17/29  brain]
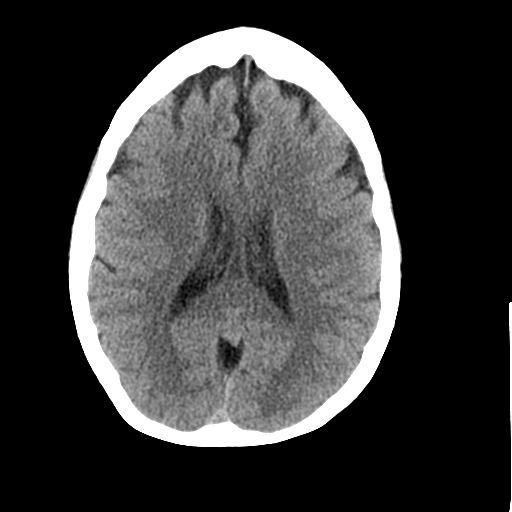
[im 19/29  brain]
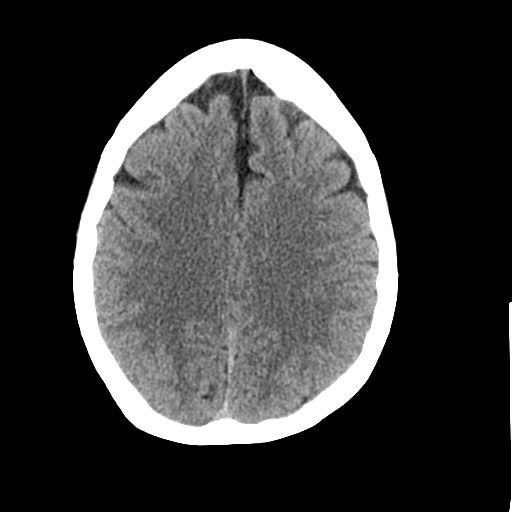
[im 19/29  bone]
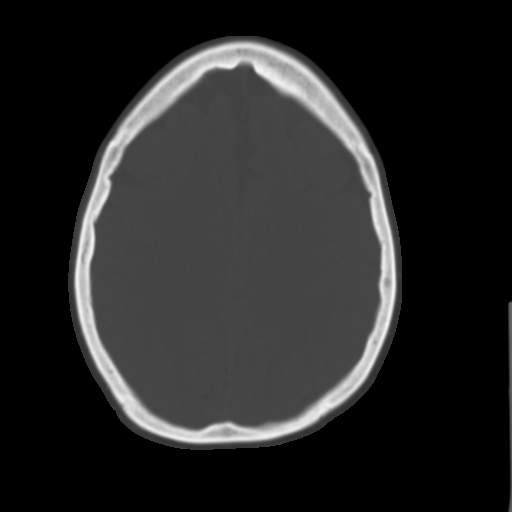
[im 21/29  brain]
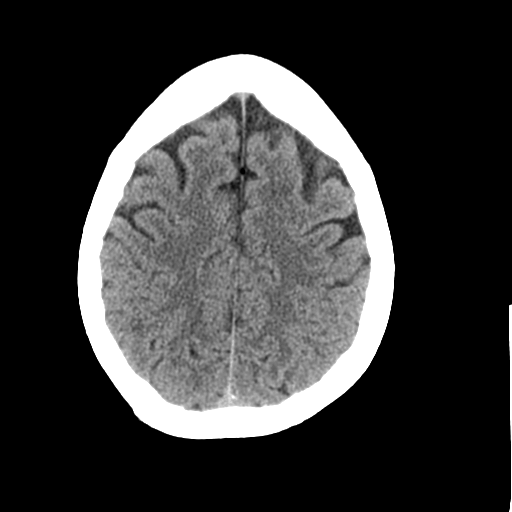
[im 23/29  brain]
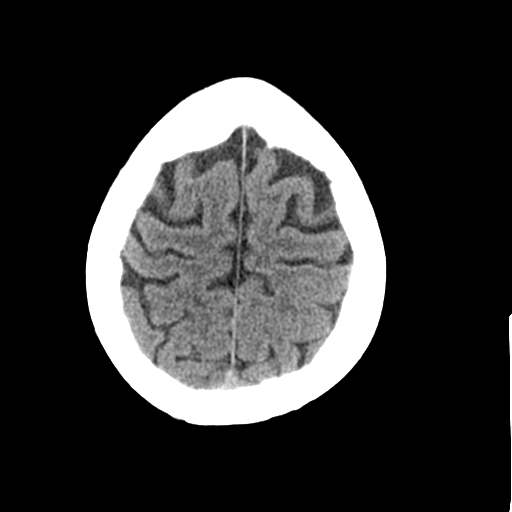
[im 25/29  brain]
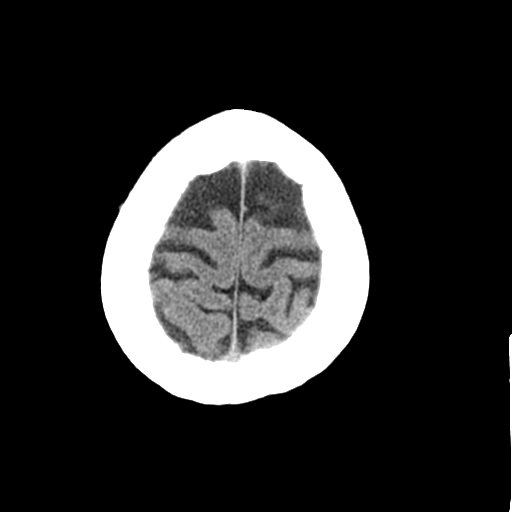
[im 27/29  brain]
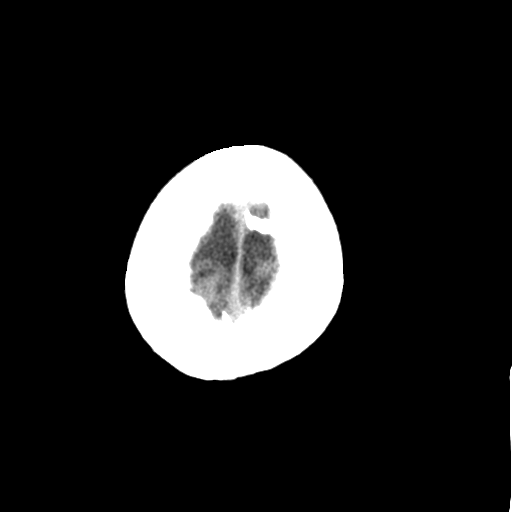
[im 27/29  bone]
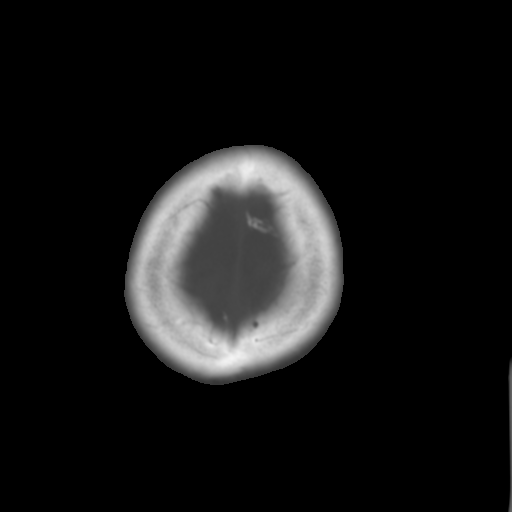

[Series 3: bone windows · axial · 0.43mm/px · z∈[+1599,+1639]mm · 3 of 29 slices shown]
[im 3/29  bone]
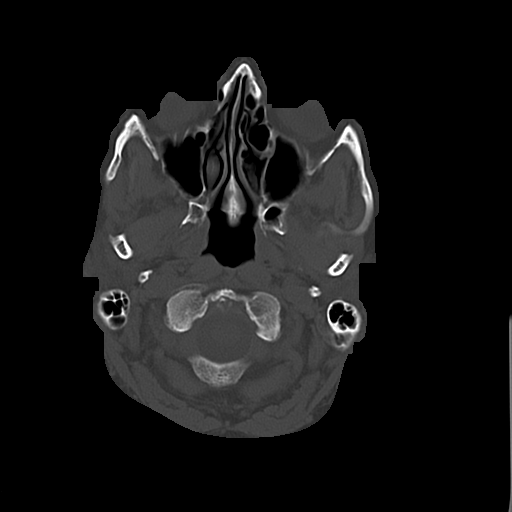
[im 7/29  bone]
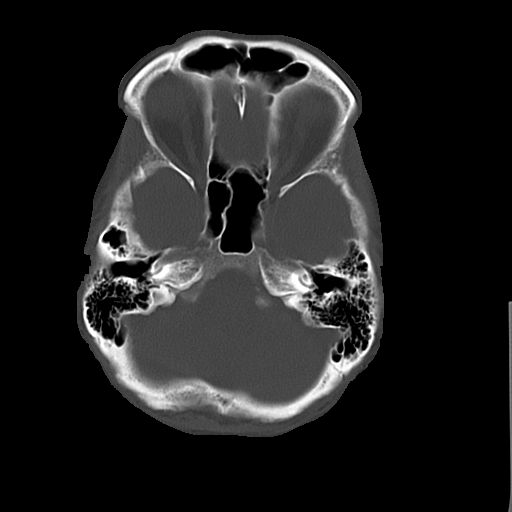
[im 11/29  bone]
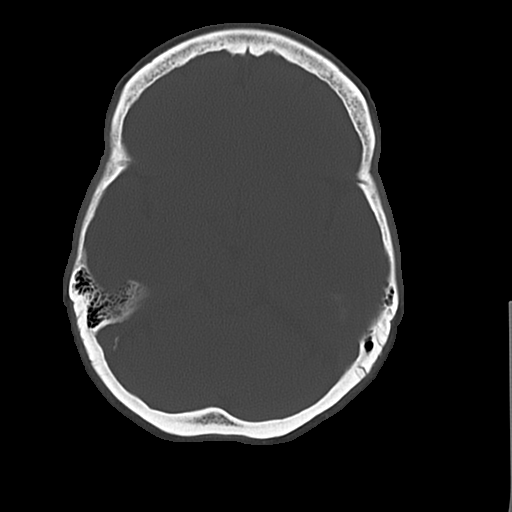

[16 of 30 positions shown; findings below may reference images not displayed]

FINDINGS: There is no evidence of acute abnormality including
infarction, hemorrhage, mass lesion, mass effect midline shift or
abnormal extra-axial fluid collection.  Calvarium intact.  No
hydrocephalus or pneumocephalus.
IMPRESSION: Negative exam.

## 2014-03-21 ENCOUNTER — Other Ambulatory Visit (HOSPITAL_COMMUNITY): Payer: Self-pay | Admitting: Family Medicine

## 2014-03-29 ENCOUNTER — Encounter (HOSPITAL_COMMUNITY): Payer: Self-pay | Admitting: Emergency Medicine

## 2014-03-29 ENCOUNTER — Emergency Department (HOSPITAL_COMMUNITY): Payer: BC Managed Care – PPO

## 2014-03-29 ENCOUNTER — Emergency Department (HOSPITAL_COMMUNITY)
Admission: EM | Admit: 2014-03-29 | Discharge: 2014-03-29 | Disposition: A | Discharge status: Home / Self Care | Payer: BC Managed Care – PPO | Attending: Emergency Medicine | Admitting: Emergency Medicine

## 2014-03-29 DIAGNOSIS — Z791 Long term (current) use of non-steroidal anti-inflammatories (NSAID): Secondary | ICD-10-CM | POA: Insufficient documentation

## 2014-03-29 DIAGNOSIS — Z8719 Personal history of other diseases of the digestive system: Secondary | ICD-10-CM | POA: Insufficient documentation

## 2014-03-29 DIAGNOSIS — Z87891 Personal history of nicotine dependence: Secondary | ICD-10-CM | POA: Insufficient documentation

## 2014-03-29 DIAGNOSIS — G43109 Migraine with aura, not intractable, without status migrainosus: Secondary | ICD-10-CM

## 2014-03-29 DIAGNOSIS — Z79899 Other long term (current) drug therapy: Secondary | ICD-10-CM | POA: Insufficient documentation

## 2014-03-29 DIAGNOSIS — R51 Headache: Secondary | ICD-10-CM | POA: Diagnosis present

## 2014-03-29 DIAGNOSIS — Z8679 Personal history of other diseases of the circulatory system: Secondary | ICD-10-CM | POA: Diagnosis not present

## 2014-03-29 DIAGNOSIS — IMO0002 Reserved for concepts with insufficient information to code with codable children: Secondary | ICD-10-CM | POA: Diagnosis not present

## 2014-03-29 DIAGNOSIS — F329 Major depressive disorder, single episode, unspecified: Secondary | ICD-10-CM | POA: Diagnosis not present

## 2014-03-29 DIAGNOSIS — F411 Generalized anxiety disorder: Secondary | ICD-10-CM | POA: Diagnosis not present

## 2014-03-29 DIAGNOSIS — F3289 Other specified depressive episodes: Secondary | ICD-10-CM | POA: Diagnosis not present

## 2014-03-29 DIAGNOSIS — E785 Hyperlipidemia, unspecified: Secondary | ICD-10-CM | POA: Diagnosis not present

## 2014-03-29 DIAGNOSIS — G43809 Other migraine, not intractable, without status migrainosus: Secondary | ICD-10-CM | POA: Insufficient documentation

## 2014-03-29 DIAGNOSIS — Z880 Allergy status to penicillin: Secondary | ICD-10-CM | POA: Diagnosis not present

## 2014-03-29 DIAGNOSIS — Z7982 Long term (current) use of aspirin: Secondary | ICD-10-CM | POA: Insufficient documentation

## 2014-03-29 LAB — DIFFERENTIAL
Basophils Absolute: 0 10*3/uL (ref 0.0–0.1)
Basophils Relative: 1 % (ref 0–1)
EOS ABS: 0.2 10*3/uL (ref 0.0–0.7)
EOS PCT: 3 % (ref 0–5)
LYMPHS PCT: 38 % (ref 12–46)
Lymphs Abs: 2.1 10*3/uL (ref 0.7–4.0)
MONOS PCT: 10 % (ref 3–12)
Monocytes Absolute: 0.6 10*3/uL (ref 0.1–1.0)
Neutro Abs: 2.7 10*3/uL (ref 1.7–7.7)
Neutrophils Relative %: 48 % (ref 43–77)

## 2014-03-29 LAB — RAPID URINE DRUG SCREEN, HOSP PERFORMED
Amphetamines: NOT DETECTED
BARBITURATES: NOT DETECTED
Benzodiazepines: NOT DETECTED
Cocaine: NOT DETECTED
Opiates: NOT DETECTED
Tetrahydrocannabinol: NOT DETECTED

## 2014-03-29 LAB — CBC
HCT: 42.2 % (ref 36.0–46.0)
HEMOGLOBIN: 13.8 g/dL (ref 12.0–15.0)
MCH: 28.3 pg (ref 26.0–34.0)
MCHC: 32.7 g/dL (ref 30.0–36.0)
MCV: 86.5 fL (ref 78.0–100.0)
Platelets: 224 10*3/uL (ref 150–400)
RBC: 4.88 MIL/uL (ref 3.87–5.11)
RDW: 13.1 % (ref 11.5–15.5)
WBC: 5.5 10*3/uL (ref 4.0–10.5)

## 2014-03-29 LAB — COMPREHENSIVE METABOLIC PANEL
ALK PHOS: 108 U/L (ref 39–117)
ALT: 16 U/L (ref 0–35)
AST: 15 U/L (ref 0–37)
Albumin: 3.8 g/dL (ref 3.5–5.2)
Anion gap: 13 (ref 5–15)
BUN: 20 mg/dL (ref 6–23)
CALCIUM: 9.7 mg/dL (ref 8.4–10.5)
CO2: 25 mEq/L (ref 19–32)
Chloride: 104 mEq/L (ref 96–112)
Creatinine, Ser: 0.91 mg/dL (ref 0.50–1.10)
GFR, EST AFRICAN AMERICAN: 77 mL/min — AB (ref >90)
GFR, EST NON AFRICAN AMERICAN: 66 mL/min — AB (ref >90)
GLUCOSE: 89 mg/dL (ref 70–99)
POTASSIUM: 4.4 meq/L (ref 3.7–5.3)
SODIUM: 142 meq/L (ref 137–147)
TOTAL PROTEIN: 6.9 g/dL (ref 6.0–8.3)
Total Bilirubin: 0.3 mg/dL (ref 0.3–1.2)

## 2014-03-29 LAB — APTT: aPTT: 22 seconds — ABNORMAL LOW (ref 24–37)

## 2014-03-29 LAB — URINALYSIS, ROUTINE W REFLEX MICROSCOPIC
Bilirubin Urine: NEGATIVE
GLUCOSE, UA: NEGATIVE mg/dL
Hgb urine dipstick: NEGATIVE
Ketones, ur: NEGATIVE mg/dL
LEUKOCYTES UA: NEGATIVE
NITRITE: NEGATIVE
PH: 7.5 (ref 5.0–8.0)
Protein, ur: NEGATIVE mg/dL
SPECIFIC GRAVITY, URINE: 1.02 (ref 1.005–1.030)
Urobilinogen, UA: 0.2 mg/dL (ref 0.0–1.0)

## 2014-03-29 LAB — PROTIME-INR
INR: 0.91 (ref 0.00–1.49)
Prothrombin Time: 12.3 seconds (ref 11.6–15.2)

## 2014-03-29 LAB — I-STAT TROPONIN, ED: Troponin i, poc: 0 ng/mL (ref 0.00–0.08)

## 2014-03-29 LAB — I-STAT CHEM 8, ED
BUN: 20 mg/dL (ref 6–23)
CALCIUM ION: 1.15 mmol/L (ref 1.13–1.30)
Chloride: 107 mEq/L (ref 96–112)
Creatinine, Ser: 1 mg/dL (ref 0.50–1.10)
GLUCOSE: 90 mg/dL (ref 70–99)
HEMATOCRIT: 43 % (ref 36.0–46.0)
Hemoglobin: 14.6 g/dL (ref 12.0–15.0)
Potassium: 4.1 mEq/L (ref 3.7–5.3)
Sodium: 141 mEq/L (ref 137–147)
TCO2: 25 mmol/L (ref 0–100)

## 2014-03-29 LAB — ETHANOL: Alcohol, Ethyl (B): <11 mg/dL (ref 0–11)

## 2014-03-29 MED ORDER — HYDROMORPHONE HCL PF 1 MG/ML IJ SOLN
1.0000 mg | Freq: Once | INTRAMUSCULAR | Status: AC
  Administered 2014-03-29: 1 mg via INTRAVENOUS
  Filled 2014-03-29: qty 1

## 2014-03-29 MED ORDER — GADOBENATE DIMEGLUMINE 529 MG/ML IV SOLN
14.0000 mL | Freq: Once | INTRAVENOUS | Status: AC | PRN
  Administered 2014-03-29: 14 mL via INTRAVENOUS

## 2014-03-29 MED ORDER — HYDROMORPHONE HCL PF 1 MG/ML IJ SOLN
0.5000 mg | Freq: Once | INTRAMUSCULAR | Status: AC
  Administered 2014-03-29: 0.5 mg via INTRAVENOUS
  Filled 2014-03-29: qty 1

## 2014-03-29 MED ORDER — ONDANSETRON HCL 4 MG/2ML IJ SOLN
4.0000 mg | Freq: Once | INTRAMUSCULAR | Status: AC
  Administered 2014-03-29: 4 mg via INTRAVENOUS
  Filled 2014-03-29: qty 2

## 2014-03-29 MED ORDER — MORPHINE SULFATE 4 MG/ML IJ SOLN
4.0000 mg | Freq: Once | INTRAMUSCULAR | Status: DC
  Filled 2014-03-29: qty 1

## 2014-03-29 NOTE — ED Notes (Signed)
Pt to CT

## 2014-03-29 NOTE — ED Notes (Signed)
Pt ret from MRI, requesting more pain medication.  Denies other needs/complaints at this time.  NAD.

## 2014-03-29 NOTE — ED Notes (Signed)
Pt ambulatory without issue, Dr. Tamera Punt aware.

## 2014-03-29 NOTE — ED Provider Notes (Signed)
CSN: [id number]     Arrival date & time 03/29/14  1420 History   First MD Initiated Contact with Patient 03/29/14 1531     Chief Complaint  Patient presents with  . Migraine     (Consider location/radiation/quality/duration/timing/severity/associated sxs/prior Treatment) HPI Comments: Patient presents with a headache. She states it started 4 days ago. She describes as a constant throbbing pain to the left frontal and temporal area. She has some intermittent neck pain but none currently. She's had a little bit of blurry vision. She feels like her balance is off a little bit although she denies any vertiginous-type symptoms. She has had associated nausea and vomiting. She states she had a history of migraines for a long period of time but hasn't had a migraine in about 10 years. She does she had a TIA several months ago. She had carotid Dopplers and an echocardiogram. She denies or any evidence of thrombosis or blockage. She was placed on cholesterol medicine and baby aspirin a day. Her TIA presenting with difficulty in her speech. She currently denies any speech deficits.  Patient is a 63 y.o. female presenting with migraines.  Migraine Associated symptoms include headaches. Pertinent negatives include no chest pain, no abdominal pain and no shortness of breath.    Past Medical History  Diagnosis Date  . Arrhythmia   . Diverticulitis   . Depression   . Headache(784.0)   . Anxiety   . HLD (hyperlipidemia)    Past Surgical History  Procedure Laterality Date  . Tonsillectomy    . Abdominal hysterectomy    . Breast enhancement surgery    . Rhinoplasty    . Oophorectomy Bilateral   . Bunionectomy Bilateral    Family History  Problem Relation Age of Onset  . Lung cancer Mother   . Heart attack Father   . Hypertension Brother   . Esophageal cancer Brother   . Colon cancer Neg Hx   . Diabetes Paternal Grandmother     questionable  . Kidney disease Neg Hx   . Liver disease Neg Hx     History  Substance Use Topics  . Smoking status: Former Smoker    Types: Cigarettes    Quit date: 06/30/2011  . Smokeless tobacco: Never Used  . Alcohol Use: 2.3 oz/week    1 Drinks containing 0.5 oz of alcohol, 3 Glasses of wine per week     Comment: GLASS OF WINE WITH DINNER- 2-3 times a week   OB History   Grav Para Term Preterm Abortions TAB SAB Ect Mult Living                 Review of Systems  Constitutional: Negative for fever, chills, diaphoresis and fatigue.  HENT: Negative for congestion, rhinorrhea and sneezing.   Eyes: Positive for visual disturbance.  Respiratory: Negative for cough, chest tightness and shortness of breath.   Cardiovascular: Negative for chest pain and leg swelling.  Gastrointestinal: Positive for nausea and vomiting. Negative for abdominal pain, diarrhea and blood in stool.  Genitourinary: Negative for frequency, hematuria, flank pain and difficulty urinating.  Musculoskeletal: Negative for arthralgias and back pain.  Skin: Negative for rash.  Neurological: Positive for headaches. Negative for dizziness, speech difficulty, weakness and numbness.      Allergies  Penicillins  Home Medications   Prior to Admission medications   Medication Sig Start Date End Date Taking? Authorizing Provider  aspirin EC 81 MG tablet Take 81 mg by mouth daily.   Yes Historical  Provider, MD  atorvastatin (LIPITOR) 20 MG tablet Take 20 mg by mouth at bedtime.   Yes Historical Provider, MD  buPROPion (WELLBUTRIN XL) 150 MG 24 hr tablet Take 150 mg by mouth daily. Taken with 300mg  tablet to equal 450mg  daily.   Yes Historical Provider, MD  buPROPion (WELLBUTRIN XL) 300 MG 24 hr tablet Take 300 mg by mouth daily. Taken with 150mg  tablet to equal 450mg  daily.   Yes Historical Provider, MD  calcium-vitamin D (OSCAL WITH D) 500-200 MG-UNIT per tablet Take 1 tablet by mouth daily with breakfast.   Yes Historical Provider, MD  fexofenadine (ALLEGRA) 180 MG tablet Take 180  mg by mouth at bedtime.   Yes Historical Provider, MD  FLUoxetine (PROZAC) 20 MG capsule Take 60 mg by mouth daily.    Yes Historical Provider, MD  fluticasone (FLONASE) 50 MCG/ACT nasal spray Place 2 sprays into both nostrils daily as needed for allergies.  10/17/13  Yes Historical Provider, MD  ibuprofen (ADVIL,MOTRIN) 200 MG tablet Take 600 mg by mouth daily as needed for mild pain.   Yes Historical Provider, MD  ketorolac (TORADOL) 15 MG/ML injection Inject 15 mg into the vein once.   Yes Historical Provider, MD  montelukast (SINGULAIR) 10 MG tablet Take 10 mg by mouth daily.    Yes Historical Provider, MD  promethazine (PHENERGAN) 25 MG/ML injection Inject 25 mg into the vein once.   Yes Historical Provider, MD  SUMAtriptan (IMITREX) 50 MG tablet Take 50 mg by mouth every 2 (two) hours as needed for migraine or headache. May repeat in 2 hours if headache persists or recurs.   Yes Historical Provider, MD  traZODone (DESYREL) 100 MG tablet Take 200 mg by mouth at bedtime.    Yes Historical Provider, MD   BP 129/70  Pulse 63  Temp(Src) 98.6 F (37 C) (Oral)  Resp 14  SpO2 97% Physical Exam  Constitutional: She is oriented to person, place, and time. She appears well-developed and well-nourished.  HENT:  Head: Normocephalic and atraumatic.  Eyes: Pupils are equal, round, and reactive to light.  Neck: Normal range of motion. Neck supple.  No meningismus  Cardiovascular: Normal rate, regular rhythm and normal heart sounds.   Pulmonary/Chest: Effort normal and breath sounds normal. No respiratory distress. She has no wheezes. She has no rales. She exhibits no tenderness.  Abdominal: Soft. Bowel sounds are normal. There is no tenderness. There is no rebound and no guarding.  Musculoskeletal: Normal range of motion. She exhibits no edema.  Lymphadenopathy:    She has no cervical adenopathy.  Neurological: She is alert and oriented to person, place, and time.  Patient is alert and oriented  x4. She does seem to have difficulty following directions although not sure if this is related to her underlying hearing deficit which is chronic for her. She has some subjective numbness to light touch she left side of her face including the forehead as opposed the right side. She has no facial drooping. She has intact shoulder shrug. She has normal tongue protrusion. Extraocular movements are intact. She has normal motor function in all extremities. She has diminished sensation to light touch in her left upper and lower extremities. She has diminished finger to nose ability on her left as compared to her right. No pronator drift was noted.  Skin: Skin is warm and dry. No rash noted.  Psychiatric: She has a normal mood and affect.    ED Course  Procedures (including critical care time) Labs  Review Results for orders placed during the hospital encounter of 03/29/14  ETHANOL      Result Value Ref Range   Alcohol, Ethyl (B) <11  0 - 11 mg/dL  PROTIME-INR      Result Value Ref Range   Prothrombin Time 12.3  11.6 - 15.2 seconds   INR 0.91  0.00 - 1.49  APTT      Result Value Ref Range   aPTT 22 (*) 24 - 37 seconds  CBC      Result Value Ref Range   WBC 5.5  4.0 - 10.5 K/uL   RBC 4.88  3.87 - 5.11 MIL/uL   Hemoglobin 13.8  12.0 - 15.0 g/dL   HCT 42.2  36.0 - 46.0 %   MCV 86.5  78.0 - 100.0 fL   MCH 28.3  26.0 - 34.0 pg   MCHC 32.7  30.0 - 36.0 g/dL   RDW 13.1  11.5 - 15.5 %   Platelets 224  150 - 400 K/uL  DIFFERENTIAL      Result Value Ref Range   Neutrophils Relative % 48  43 - 77 %   Neutro Abs 2.7  1.7 - 7.7 K/uL   Lymphocytes Relative 38  12 - 46 %   Lymphs Abs 2.1  0.7 - 4.0 K/uL   Monocytes Relative 10  3 - 12 %   Monocytes Absolute 0.6  0.1 - 1.0 K/uL   Eosinophils Relative 3  0 - 5 %   Eosinophils Absolute 0.2  0.0 - 0.7 K/uL   Basophils Relative 1  0 - 1 %   Basophils Absolute 0.0  0.0 - 0.1 K/uL  COMPREHENSIVE METABOLIC PANEL      Result Value Ref Range   Sodium 142   137 - 147 mEq/L   Potassium 4.4  3.7 - 5.3 mEq/L   Chloride 104  96 - 112 mEq/L   CO2 25  19 - 32 mEq/L   Glucose, Bld 89  70 - 99 mg/dL   BUN 20  6 - 23 mg/dL   Creatinine, Ser 0.91  0.50 - 1.10 mg/dL   Calcium 9.7  8.4 - 10.5 mg/dL   Total Protein 6.9  6.0 - 8.3 g/dL   Albumin 3.8  3.5 - 5.2 g/dL   AST 15  0 - 37 U/L   ALT 16  0 - 35 U/L   Alkaline Phosphatase 108  39 - 117 U/L   Total Bilirubin 0.3  0.3 - 1.2 mg/dL   GFR calc non Af Amer 66 (*) >90 mL/min   GFR calc Af Amer 77 (*) >90 mL/min   Anion gap 13  5 - 15  URINE RAPID DRUG SCREEN (HOSP PERFORMED)      Result Value Ref Range   Opiates NONE DETECTED  NONE DETECTED   Cocaine NONE DETECTED  NONE DETECTED   Benzodiazepines NONE DETECTED  NONE DETECTED   Amphetamines NONE DETECTED  NONE DETECTED   Tetrahydrocannabinol NONE DETECTED  NONE DETECTED   Barbiturates NONE DETECTED  NONE DETECTED  URINALYSIS, ROUTINE W REFLEX MICROSCOPIC      Result Value Ref Range   Color, Urine YELLOW  YELLOW   APPearance CLEAR  CLEAR   Specific Gravity, Urine 1.020  1.005 - 1.030   pH 7.5  5.0 - 8.0   Glucose, UA NEGATIVE  NEGATIVE mg/dL   Hgb urine dipstick NEGATIVE  NEGATIVE   Bilirubin Urine NEGATIVE  NEGATIVE   Ketones, ur NEGATIVE  NEGATIVE mg/dL   Protein, ur NEGATIVE  NEGATIVE mg/dL   Urobilinogen, UA 0.2  0.0 - 1.0 mg/dL   Nitrite NEGATIVE  NEGATIVE   Leukocytes, UA NEGATIVE  NEGATIVE  I-STAT CHEM 8, ED      Result Value Ref Range   Sodium 141  137 - 147 mEq/L   Potassium 4.1  3.7 - 5.3 mEq/L   Chloride 107  96 - 112 mEq/L   BUN 20  6 - 23 mg/dL   Creatinine, Ser 1.00  0.50 - 1.10 mg/dL   Glucose, Bld 90  70 - 99 mg/dL   Calcium, Ion 1.15  1.13 - 1.30 mmol/L   TCO2 25  0 - 100 mmol/L   Hemoglobin 14.6  12.0 - 15.0 g/dL   HCT 43.0  36.0 - 46.0 %  I-STAT TROPOININ, ED      Result Value Ref Range   Troponin i, poc 0.00  0.00 - 0.08 ng/mL   Comment 3            Ct Head Wo Contrast  03/29/2014   CLINICAL DATA:  Four-day  history of left parietal headache  EXAM: CT HEAD WITHOUT CONTRAST  TECHNIQUE: Contiguous axial images were obtained from the base of the skull through the vertex without intravenous contrast.  COMPARISON:  Noncontrast CT scan of brain of July 04, 2013  FINDINGS: There is mild diffuse cerebral and cerebellar atrophy. The ventricles are normal in size and position. There is no intracranial hemorrhage nor intracranial mass effect. There is subtle decreased density in the deep white matter of both cerebral hemispheres consistent with chronic small vessel ischemic change. The cerebellum and brainstem are normal.  The paranasal sinuses and mastoid air cells are clear. There is no acute skull fracture. There is old deformity of the nasal bones.  IMPRESSION: 1. There is no acute intracranial hemorrhage nor acute ischemic change. 2. There is no hydrocephalus nor intracranial mass effect. 3. There is no acute abnormality of the visualized paranasal sinuses.   Electronically Signed   By: David  Martinique   On: 03/29/2014 16:27   Mr Brain W Wo Contrast  03/29/2014   CLINICAL DATA:  Migraine headaches with blurred vision light sensitivity. Nausea for 3 days. Left-sided cerebellar dysfunction.  EXAM: MRI HEAD WITHOUT AND WITH CONTRAST  TECHNIQUE: Multiplanar, multiecho pulse sequences of the brain and surrounding structures were obtained without and with intravenous contrast.  CONTRAST:  79mL MULTIHANCE GADOBENATE DIMEGLUMINE 529 MG/ML IV SOLN  COMPARISON:  CT head without contrast 03/29/2014. MRI brain 08/06/2013.  FINDINGS: Scattered subcortical T2 hyperintensities in the high anterior frontal lobes bilaterally are slightly greater than expected for age. These are stable from the prior study.  No acute infarct, hemorrhage, or mass lesion is present. The ventricles are of normal size. No significant extraaxial fluid collection is present.  The globes and orbits are intact. Flow is present in the major intracranial arteries.  Mild mucosal thickening is scattered through the ethmoid air cells. The remaining paranasal sinuses and the mastoid air cells are clear.  The postcontrast images demonstrate no pathologic enhancement.  IMPRESSION: 1. No acute intracranial abnormality or significant interval change. 2. Stable scattered subcortical T2 hyperintensities in the high anterior frontal lobes bilaterally. The finding is nonspecific but can be seen in the setting of chronic microvascular ischemia, a demyelinating process such as multiple sclerosis, vasculitis, complicated migraine headaches, or as the sequelae of a prior infectious or inflammatory process.   Electronically Signed   By:  Lawrence Santiago M.D.   On: 03/29/2014 18:35      Imaging Review Ct Head Wo Contrast  03/29/2014   CLINICAL DATA:  Four-day history of left parietal headache  EXAM: CT HEAD WITHOUT CONTRAST  TECHNIQUE: Contiguous axial images were obtained from the base of the skull through the vertex without intravenous contrast.  COMPARISON:  Noncontrast CT scan of brain of July 04, 2013  FINDINGS: There is mild diffuse cerebral and cerebellar atrophy. The ventricles are normal in size and position. There is no intracranial hemorrhage nor intracranial mass effect. There is subtle decreased density in the deep white matter of both cerebral hemispheres consistent with chronic small vessel ischemic change. The cerebellum and brainstem are normal.  The paranasal sinuses and mastoid air cells are clear. There is no acute skull fracture. There is old deformity of the nasal bones.  IMPRESSION: 1. There is no acute intracranial hemorrhage nor acute ischemic change. 2. There is no hydrocephalus nor intracranial mass effect. 3. There is no acute abnormality of the visualized paranasal sinuses.   Electronically Signed   By: David  Martinique   On: 03/29/2014 16:27   Mr Brain W Wo Contrast  03/29/2014   CLINICAL DATA:  Migraine headaches with blurred vision light sensitivity. Nausea  for 3 days. Left-sided cerebellar dysfunction.  EXAM: MRI HEAD WITHOUT AND WITH CONTRAST  TECHNIQUE: Multiplanar, multiecho pulse sequences of the brain and surrounding structures were obtained without and with intravenous contrast.  CONTRAST:  28mL MULTIHANCE GADOBENATE DIMEGLUMINE 529 MG/ML IV SOLN  COMPARISON:  CT head without contrast 03/29/2014. MRI brain 08/06/2013.  FINDINGS: Scattered subcortical T2 hyperintensities in the high anterior frontal lobes bilaterally are slightly greater than expected for age. These are stable from the prior study.  No acute infarct, hemorrhage, or mass lesion is present. The ventricles are of normal size. No significant extraaxial fluid collection is present.  The globes and orbits are intact. Flow is present in the major intracranial arteries. Mild mucosal thickening is scattered through the ethmoid air cells. The remaining paranasal sinuses and the mastoid air cells are clear.  The postcontrast images demonstrate no pathologic enhancement.  IMPRESSION: 1. No acute intracranial abnormality or significant interval change. 2. Stable scattered subcortical T2 hyperintensities in the high anterior frontal lobes bilaterally. The finding is nonspecific but can be seen in the setting of chronic microvascular ischemia, a demyelinating process such as multiple sclerosis, vasculitis, complicated migraine headaches, or as the sequelae of a prior infectious or inflammatory process.   Electronically Signed   By: Lawrence Santiago M.D.   On: 03/29/2014 18:35     EKG Interpretation   Date/Time:  Wednesday March 29 2014 16:07:35 EDT Ventricular Rate:  63 PR Interval:  135 QRS Duration: 84 QT Interval:  437 QTC Calculation: 447 R Axis:   50 Text Interpretation:  Sinus rhythm Low voltage, precordial leads since  last tracing no significant change Confirmed by Shadeed Colberg  MD, Braxen Dobek ([id number])  on 03/29/2014 4:13:27 PM      MDM   Final diagnoses:  Migraine equivalent    Patient  presents with left-sided headache. Her pain feels similar to her past migraines type headaches however she did have some sensation deficit on the left side as well as some cerebellar dysfunction. I did do a head CT and MRI which were both unremarkable. There is no evidence of infarcts. There was some abnormal signals that were unchanged from her previous MRI. I discussed with Dr. Leonel Ramsay with neurology who felt  that with a negative MRI it was more likely a complex migraine. She's able to ambulate in the ED without problem. Her finger to nose evaluation has improved. Encouraged her to followup with her neurologist at per neurology.  Her pain has improved after pain medications.    Malvin Johns, MD 03/29/14 2019

## 2014-03-29 NOTE — ED Notes (Signed)
Pt declines morphine, sts "it gives me a headache".  Dr. Tamera Punt to be made aware.

## 2014-03-29 NOTE — ED Notes (Signed)
Initial Contact - pt A+Ox4, presents with c/o HA x4 days, pt reports remote hx of migraines.  +photo/phono-phobia, +nausea.  Neuros grossly intact.  Skin PWD.  MAEI.  Speaking full/clear sentences, rr even/un-lab.  NAD.

## 2014-03-29 NOTE — ED Notes (Signed)
Let pt know that we needed a urine sample, pt states that she just went to the bathroom

## 2014-03-29 NOTE — ED Notes (Signed)
Pt gone to MRI 

## 2014-03-29 NOTE — Discharge Instructions (Signed)

## 2014-03-29 NOTE — ED Notes (Signed)
Pt c/o migraine, blurred vision, light sensitivity, and nausea x 3 days.  Pains score 7/10.  Pt reports PCP prescribed Imitrex, which has provided no relief.  Pt was seen by PCP yesterday and given Toradol and phenergan.  Denies numbness and tingling.

## 2014-03-31 ENCOUNTER — Encounter (HOSPITAL_COMMUNITY): Payer: Self-pay | Admitting: Emergency Medicine

## 2014-03-31 ENCOUNTER — Telehealth: Payer: Self-pay | Admitting: Neurology

## 2014-03-31 ENCOUNTER — Emergency Department (HOSPITAL_COMMUNITY)
Admission: EM | Admit: 2014-03-31 | Discharge: 2014-03-31 | Disposition: A | Discharge status: Home / Self Care | Payer: BC Managed Care – PPO | Attending: Emergency Medicine | Admitting: Emergency Medicine

## 2014-03-31 DIAGNOSIS — F3289 Other specified depressive episodes: Secondary | ICD-10-CM | POA: Insufficient documentation

## 2014-03-31 DIAGNOSIS — Z79899 Other long term (current) drug therapy: Secondary | ICD-10-CM | POA: Diagnosis not present

## 2014-03-31 DIAGNOSIS — E785 Hyperlipidemia, unspecified: Secondary | ICD-10-CM | POA: Diagnosis not present

## 2014-03-31 DIAGNOSIS — R51 Headache: Secondary | ICD-10-CM | POA: Insufficient documentation

## 2014-03-31 DIAGNOSIS — Z87891 Personal history of nicotine dependence: Secondary | ICD-10-CM | POA: Diagnosis not present

## 2014-03-31 DIAGNOSIS — Z8679 Personal history of other diseases of the circulatory system: Secondary | ICD-10-CM | POA: Insufficient documentation

## 2014-03-31 DIAGNOSIS — Z7982 Long term (current) use of aspirin: Secondary | ICD-10-CM | POA: Diagnosis not present

## 2014-03-31 DIAGNOSIS — Z880 Allergy status to penicillin: Secondary | ICD-10-CM | POA: Insufficient documentation

## 2014-03-31 DIAGNOSIS — F329 Major depressive disorder, single episode, unspecified: Secondary | ICD-10-CM | POA: Diagnosis not present

## 2014-03-31 DIAGNOSIS — F411 Generalized anxiety disorder: Secondary | ICD-10-CM | POA: Diagnosis not present

## 2014-03-31 DIAGNOSIS — G43809 Other migraine, not intractable, without status migrainosus: Secondary | ICD-10-CM | POA: Diagnosis not present

## 2014-03-31 DIAGNOSIS — Z8719 Personal history of other diseases of the digestive system: Secondary | ICD-10-CM | POA: Insufficient documentation

## 2014-03-31 DIAGNOSIS — Z791 Long term (current) use of non-steroidal anti-inflammatories (NSAID): Secondary | ICD-10-CM | POA: Insufficient documentation

## 2014-03-31 MED ORDER — SUMATRIPTAN SUCCINATE 25 MG PO TABS: 50.0000 mg | ORAL_TABLET | Freq: Once | ORAL | Status: DC

## 2014-03-31 MED ORDER — METOCLOPRAMIDE HCL 5 MG/ML IJ SOLN
10.0000 mg | Freq: Once | INTRAMUSCULAR | Status: AC
  Administered 2014-03-31: 10 mg via INTRAVENOUS
  Filled 2014-03-31: qty 2

## 2014-03-31 MED ORDER — DIPHENHYDRAMINE HCL 50 MG/ML IJ SOLN
25.0000 mg | Freq: Once | INTRAMUSCULAR | Status: AC
  Administered 2014-03-31: 25 mg via INTRAVENOUS
  Filled 2014-03-31: qty 1

## 2014-03-31 MED ORDER — KETOROLAC TROMETHAMINE 30 MG/ML IJ SOLN
30.0000 mg | Freq: Once | INTRAMUSCULAR | Status: AC
  Administered 2014-03-31: 30 mg via INTRAVENOUS
  Filled 2014-03-31: qty 1

## 2014-03-31 MED ORDER — SODIUM CHLORIDE 0.9 % IV BOLUS (SEPSIS)
1000.0000 mL | Freq: Once | INTRAVENOUS | Status: AC
  Administered 2014-03-31: 1000 mL via INTRAVENOUS

## 2014-03-31 NOTE — ED Provider Notes (Signed)
CSN: [id number]     Arrival date & time 03/31/14  1519 History   First MD Initiated Contact with Patient 03/31/14 1744     Chief Complaint  Patient presents with  . Migraine     (Consider location/radiation/quality/duration/timing/severity/associated sxs/prior Treatment) HPI Pt is a 63yo female with remote hx of migraines 10 years ago for which she took imitrex, pt presenting to ED today with c/o worsening frontal headache that started 6 days ago. Pt was evaluated for same 2 days ago. Exam including unremarkable head CT, MRI and neurology consult. Pt was dx with a complex migraine and advised to f/u with neurology. Pt states appointment was set for 9/18 but headache has worsened over last 2 days.  Reports associated blurred vision, photophobia, nausea and unsteady gait, states "my head just feels funny, but not like vertigo."  Denies fever, chills, vomiting or diarrhea.  Denies new symptoms. States headache is worse on left side of head like it felt 2 days ago. Denies new falls or injuries.    Past Medical History  Diagnosis Date  . Arrhythmia   . Diverticulitis   . Depression   . Headache(784.0)   . Anxiety   . HLD (hyperlipidemia)    Past Surgical History  Procedure Laterality Date  . Tonsillectomy    . Abdominal hysterectomy    . Breast enhancement surgery    . Rhinoplasty    . Oophorectomy Bilateral   . Bunionectomy Bilateral    Family History  Problem Relation Age of Onset  . Lung cancer Mother   . Heart attack Father   . Hypertension Brother   . Esophageal cancer Brother   . Colon cancer Neg Hx   . Diabetes Paternal Grandmother     questionable  . Kidney disease Neg Hx   . Liver disease Neg Hx    History  Substance Use Topics  . Smoking status: Former Smoker    Types: Cigarettes    Quit date: 06/30/2011  . Smokeless tobacco: Never Used  . Alcohol Use: 2.3 oz/week    1 Drinks containing 0.5 oz of alcohol, 3 Glasses of wine per week     Comment: GLASS OF WINE  WITH DINNER- 2-3 times a week   OB History   Grav Para Term Preterm Abortions TAB SAB Ect Mult Living                 Review of Systems  Constitutional: Negative for fever and chills.  Eyes: Positive for photophobia and visual disturbance ( blurred).  Respiratory: Negative for cough and shortness of breath.   Cardiovascular: Negative for chest pain and palpitations.  Gastrointestinal: Positive for nausea. Negative for vomiting, abdominal pain, diarrhea and constipation.  Neurological: Positive for tremors, light-headedness and headaches. Negative for dizziness, weakness and numbness.  All other systems reviewed and are negative.     Allergies  Penicillins  Home Medications   Prior to Admission medications   Medication Sig Start Date End Date Taking? Authorizing Provider  aspirin EC 81 MG tablet Take 81 mg by mouth daily.   Yes Historical Provider, MD  atorvastatin (LIPITOR) 20 MG tablet Take 20 mg by mouth at bedtime.   Yes Historical Provider, MD  buPROPion (WELLBUTRIN XL) 150 MG 24 hr tablet Take 150 mg by mouth daily. Taken with 300mg  tablet to equal 450mg  daily.   Yes Historical Provider, MD  buPROPion (WELLBUTRIN XL) 300 MG 24 hr tablet Take 300 mg by mouth daily. Taken with 150mg  tablet to  equal 450mg  daily.   Yes Historical Provider, MD  calcium-vitamin D (OSCAL WITH D) 500-200 MG-UNIT per tablet Take 1 tablet by mouth daily with breakfast.   Yes Historical Provider, MD  fexofenadine (ALLEGRA) 180 MG tablet Take 180 mg by mouth at bedtime.   Yes Historical Provider, MD  FLUoxetine (PROZAC) 20 MG capsule Take 60 mg by mouth daily.    Yes Historical Provider, MD  fluticasone (FLONASE) 50 MCG/ACT nasal spray Place 2 sprays into both nostrils daily as needed for allergies.  10/17/13  Yes Historical Provider, MD  ibuprofen (ADVIL,MOTRIN) 200 MG tablet Take 600 mg by mouth daily as needed for mild pain.   Yes Historical Provider, MD  ketorolac (TORADOL) 15 MG/ML injection Inject 15  mg into the vein once.   Yes Historical Provider, MD  montelukast (SINGULAIR) 10 MG tablet Take 10 mg by mouth daily.    Yes Historical Provider, MD  promethazine (PHENERGAN) 25 MG/ML injection Inject 25 mg into the vein once.   Yes Historical Provider, MD  traZODone (DESYREL) 100 MG tablet Take 200 mg by mouth at bedtime.    Yes Historical Provider, MD  SUMAtriptan (IMITREX) 25 MG tablet Take 2 tablets (50 mg total) by mouth once. May take 50mg  at onset of migraine and repeat once after 2 hours. Maximum daily dose 200mg  03/31/14   Noland Fordyce, PA-C   BP 134/69  Pulse 55  Temp(Src) 97.8 F (36.6 C) (Oral)  Resp 20  SpO2 99% Physical Exam  Nursing note and vitals reviewed. Constitutional: She is oriented to person, place, and time. She appears well-developed and well-nourished. No distress.  HENT:  Head: Normocephalic and atraumatic.  Eyes: Conjunctivae and EOM are normal. Pupils are equal, round, and reactive to light. No scleral icterus.  Neck: Normal range of motion. Neck supple.  No nuchal rigidity or meningeal signs.  Cardiovascular: Normal rate, regular rhythm and normal heart sounds.   Pulmonary/Chest: Effort normal and breath sounds normal. No respiratory distress. She has no wheezes. She has no rales. She exhibits no tenderness.  Abdominal: Soft. Bowel sounds are normal. She exhibits no distension and no mass. There is no tenderness. There is no rebound and no guarding.  Musculoskeletal: Normal range of motion.  Neurological: She is alert and oriented to person, place, and time. She has normal strength. No cranial nerve deficit or sensory deficit. She displays a negative Romberg sign. GCS eye subscore is 4. GCS verbal subscore is 5. GCS motor subscore is 6.  CN II-XII grossly in tact. FROM all extremities, 5/5 strength in upper and lower extremities bilaterally. Normal finger to nose coordination, however, pt does seem to have to focus more with left hand during exam.  No pronator  drift, mildly altered gait (pt states "not like vertigo, my head just feels funny")  Skin: Skin is warm and dry. She is not diaphoretic.    ED Course  Procedures (including critical care time) Labs Review Labs Reviewed - No data to display  Imaging Review Mr Jeri Cos Wo Contrast  03/29/2014   CLINICAL DATA:  Migraine headaches with blurred vision light sensitivity. Nausea for 3 days. Left-sided cerebellar dysfunction.  EXAM: MRI HEAD WITHOUT AND WITH CONTRAST  TECHNIQUE: Multiplanar, multiecho pulse sequences of the brain and surrounding structures were obtained without and with intravenous contrast.  CONTRAST:  22mL MULTIHANCE GADOBENATE DIMEGLUMINE 529 MG/ML IV SOLN  COMPARISON:  CT head without contrast 03/29/2014. MRI brain 08/06/2013.  FINDINGS: Scattered subcortical T2 hyperintensities in the high  anterior frontal lobes bilaterally are slightly greater than expected for age. These are stable from the prior study.  No acute infarct, hemorrhage, or mass lesion is present. The ventricles are of normal size. No significant extraaxial fluid collection is present.  The globes and orbits are intact. Flow is present in the major intracranial arteries. Mild mucosal thickening is scattered through the ethmoid air cells. The remaining paranasal sinuses and the mastoid air cells are clear.  The postcontrast images demonstrate no pathologic enhancement.  IMPRESSION: 1. No acute intracranial abnormality or significant interval change. 2. Stable scattered subcortical T2 hyperintensities in the high anterior frontal lobes bilaterally. The finding is nonspecific but can be seen in the setting of chronic microvascular ischemia, a demyelinating process such as multiple sclerosis, vasculitis, complicated migraine headaches, or as the sequelae of a prior infectious or inflammatory process.   Electronically Signed   By: Lawrence Santiago M.D.   On: 03/29/2014 18:35     EKG Interpretation None      MDM   Final  diagnoses:  Other migraine without status migrainosus, not intractable    Pt is a 63yo female with hx of migraines, dx with complex migraine by neurology after evaluation in ED 2 days ago.  Pt states headache improved and was manageable at that time but worsened over last 2 days.  F/u with neuro is 9/18. Medical records reviewed. Doubt SAH, CVA/TIA, intracranial mass, meningitis or other emergent process taking place at this time.  Denies new symptoms, just reports worsened headache.  Denies trauma to head or falls. On exam, mildly unsteady gait, hx of same, otherwise, unremarkable, no focal neuro deficit. PERRL. EOM in tact.  Pt given IV fluids, Reglan, benadryl, and toradol which brought her headache from an 8/10 to 3/10. Pt states she wants to go home and feels comfortable being discharged home to f/u with neurology as previously scheduled for 9/18. Return precautions provided. Pt verbalized understanding and agreement with tx plan.     Noland Fordyce, PA-C 03/31/14 2012

## 2014-03-31 NOTE — ED Notes (Addendum)
Pt c/o migraine and nausea x 5 days.  Pain score 8/10.  Pt was seen at [facility] x 2 days ago for same and diagnosed w/ a complicated migraine.  Pt reports prescribed pain medication has not "knocked it all the way out."  Pt has run out of Imitrex.

## 2014-03-31 NOTE — Telephone Encounter (Signed)
I called pt and LMVM for her to return call about infusion appt.

## 2014-03-31 NOTE — Telephone Encounter (Signed)
Patient states she's had migraine since Sunday.  Went to ED on Wednesday had MRI and CT scan a was given Dilauded.  Tuesday received Toradol from PCP, did help at all, still having Migraine.  Please call anytime and may leave detailed message if not available.

## 2014-03-31 NOTE — ED Provider Notes (Signed)
Medical screening examination/treatment/procedure(s) were performed by non-physician practitioner and as supervising physician I was immediately available for consultation/collaboration.   EKG Interpretation None      Rolland Porter, MD, Abram Sander   Janice Norrie, MD 03/31/14 [phone]

## 2014-03-31 NOTE — Telephone Encounter (Signed)
LMVM for pt to return call for infusion.

## 2014-03-31 NOTE — Telephone Encounter (Signed)
Spoke to patient and she relayed that her headache started Sunday, had a Toradol shot from PCP on Tuesday that didn't help.  She went to ED on Wednesday had was given dilaudid that helped temporarily but headache is back and about a pain level 8.  She has had Depacon infusion in the past that was not helpful.  Please advise.

## 2014-03-31 NOTE — Telephone Encounter (Signed)
I have called Dorothy White, she has been complains daily headache, since Sunday, she had HA 5 days, 8/10, dilaudid help temporarily, she has tried imitrex tabs did not help. Toradol, did not help much.  Butch Penny, please call patients for IV infusion, Compazine 10 mg, Depacon 500 mg x2, Solu-Medrol 250 mg as needed Toradol 30 mg IV.,

## 2014-03-31 NOTE — ED Notes (Addendum)
Pt reports migraine since Wednesday when she came to the ED.  Pt was given dilaudid and zofran with no relief.  Pt states migraine has gotten worse.  Pt reports nausea, denies emesis and diarrhea.  Pt reports blurred vision, light headedness.  Pt denies numbness/tingling or any one sided weakness. Pt has neurology appt 04/14/14. Pt has taken ibuprofen at home for pain without relief.  Pt has had migraines for 25 years.

## 2014-03-31 NOTE — Discharge Instructions (Signed)
Migraine Headache °A migraine headache is very bad, throbbing pain on one or both sides of your head. Talk to your doctor about what things may bring on (trigger) your migraine headaches. °HOME CARE °· Only take medicines as told by your doctor. °· Lie down in a dark, quiet room when you have a migraine. °· Keep a journal to find out if certain things bring on migraine headaches. For example, write down: °· What you eat and drink. °· How much sleep you get. °· Any change to your diet or medicines. °· Lessen how much alcohol you drink. °· Quit smoking if you smoke. °· Get enough sleep. °· Lessen any stress in your life. °· Keep lights dim if bright lights bother you or make your migraines worse. °GET HELP RIGHT AWAY IF:  °· Your migraine becomes really bad. °· You have a fever. °· You have a stiff neck. °· You have trouble seeing. °· Your muscles are weak, or you lose muscle control. °· You lose your balance or have trouble walking. °· You feel like you will pass out (faint), or you pass out. °· You have really bad symptoms that are different than your first symptoms. °MAKE SURE YOU:  °· Understand these instructions. °· Will watch your condition. °· Will get help right away if you are not doing well or get worse. °Document Released: 04/22/2008 Document Revised: 10/06/2011 Document Reviewed: 03/21/2013 °ExitCare® Patient Information ©2015 [employer]. This information is not intended to replace advice given to you by your health care provider. Make sure you discuss any questions you have with your health care provider. ° °Headaches, Frequently Asked Questions °MIGRAINE HEADACHES °Q: What is migraine? What causes it? How can I treat it? °A: Generally, migraine headaches begin as a dull ache. Then they develop into a constant, throbbing, and pulsating pain. You may experience pain at the temples. You may experience pain at the front or back of one or both sides of the head. The pain is usually accompanied by a  combination of: °· Nausea. °· Vomiting. °· Sensitivity to light and noise. °Some people (about 15%) experience an aura (see below) before an attack. The cause of migraine is believed to be chemical reactions in the brain. Treatment for migraine may include over-the-counter or prescription medications. It may also include self-help techniques. These include relaxation training and biofeedback.  °Q: What is an aura? °A: About 15% of people with migraine get an "aura". This is a sign of neurological symptoms that occur before a migraine headache. You may see wavy or jagged lines, dots, or flashing lights. You might experience tunnel vision or blind spots in one or both eyes. The aura can include visual or auditory hallucinations (something imagined). It may include disruptions in smell (such as strange odors), taste or touch. Other symptoms include: °· Numbness. °· A "pins and needles" sensation. °· Difficulty in recalling or speaking the correct word. °These neurological events may last as long as 60 minutes. These symptoms will fade as the headache begins. °Q: What is a trigger? °A: Certain physical or environmental factors can lead to or "trigger" a migraine. These include: °· Foods. °· Hormonal changes. °· Weather. °· Stress. °It is important to remember that triggers are different for everyone. To help prevent migraine attacks, you need to figure out which triggers affect you. Keep a headache diary. This is a good way to track triggers. The diary will help you talk to your healthcare professional about your condition. °Q: Does   weather affect migraines? °A: Bright sunshine, hot, humid conditions, and drastic changes in barometric pressure may lead to, or "trigger," a migraine attack in some people. But studies have shown that weather does not act as a trigger for everyone with migraines. °Q: What is the link between migraine and hormones? °A: Hormones start and regulate many of your body's functions. Hormones keep  your body in balance within a constantly changing environment. The levels of hormones in your body are unbalanced at times. Examples are during menstruation, pregnancy, or menopause. That can lead to a migraine attack. In fact, about three quarters of all women with migraine report that their attacks are related to the menstrual cycle.  °Q: Is there an increased risk of stroke for migraine sufferers? °A: The likelihood of a migraine attack causing a stroke is very remote. That is not to say that migraine sufferers cannot have a stroke associated with their migraines. In persons under age 40, the most common associated factor for stroke is migraine headache. But over the course of a person's normal life span, the occurrence of migraine headache may actually be associated with a reduced risk of dying from cerebrovascular disease due to stroke.  °Q: What are acute medications for migraine? °A: Acute medications are used to treat the pain of the headache after it has started. Examples over-the-counter medications, NSAIDs, ergots, and triptans.  °Q: What are the triptans? °A: Triptans are the newest class of abortive medications. They are specifically targeted to treat migraine. Triptans are vasoconstrictors. They moderate some chemical reactions in the brain. The triptans work on receptors in your brain. Triptans help to restore the balance of a neurotransmitter called serotonin. Fluctuations in levels of serotonin are thought to be a main cause of migraine.  °Q: Are over-the-counter medications for migraine effective? °A: Over-the-counter, or "OTC," medications may be effective in relieving mild to moderate pain and associated symptoms of migraine. But you should see your caregiver before beginning any treatment regimen for migraine.  °Q: What are preventive medications for migraine? °A: Preventive medications for migraine are sometimes referred to as "prophylactic" treatments. They are used to reduce the frequency,  severity, and length of migraine attacks. Examples of preventive medications include antiepileptic medications, antidepressants, beta-blockers, calcium channel blockers, and NSAIDs (nonsteroidal anti-inflammatory drugs). °Q: Why are anticonvulsants used to treat migraine? °A: During the past few years, there has been an increased interest in antiepileptic drugs for the prevention of migraine. They are sometimes referred to as "anticonvulsants". Both epilepsy and migraine may be caused by similar reactions in the brain.  °Q: Why are antidepressants used to treat migraine? °A: Antidepressants are typically used to treat people with depression. They may reduce migraine frequency by regulating chemical levels, such as serotonin, in the brain.  °Q: What alternative therapies are used to treat migraine? °A: The term "alternative therapies" is often used to describe treatments considered outside the scope of conventional Western medicine. Examples of alternative therapy include acupuncture, acupressure, and yoga. Another common alternative treatment is herbal therapy. Some herbs are believed to relieve headache pain. Always discuss alternative therapies with your caregiver before proceeding. Some herbal products contain arsenic and other toxins. °TENSION HEADACHES °Q: What is a tension-type headache? What causes it? How can I treat it? °A: Tension-type headaches occur randomly. They are often the result of temporary stress, anxiety, fatigue, or anger. Symptoms include soreness in your temples, a tightening band-like sensation around your head (a "vice-like" ache). Symptoms can also include a pulling   feeling, pressure sensations, and contracting head and neck muscles. The headache begins in your forehead, temples, or the back of your head and neck. Treatment for tension-type headache may include over-the-counter or prescription medications. Treatment may also include self-help techniques such as relaxation training and  biofeedback. °CLUSTER HEADACHES °Q: What is a cluster headache? What causes it? How can I treat it? °A: Cluster headache gets its name because the attacks come in groups. The pain arrives with little, if any, warning. It is usually on one side of the head. A tearing or bloodshot eye and a runny nose on the same side of the headache may also accompany the pain. Cluster headaches are believed to be caused by chemical reactions in the brain. They have been described as the most severe and intense of any headache type. Treatment for cluster headache includes prescription medication and oxygen. °SINUS HEADACHES °Q: What is a sinus headache? What causes it? How can I treat it? °A: When a cavity in the bones of the face and skull (a sinus) becomes inflamed, the inflammation will cause localized pain. This condition is usually the result of an allergic reaction, a tumor, or an infection. If your headache is caused by a sinus blockage, such as an infection, you will probably have a fever. An x-ray will confirm a sinus blockage. Your caregiver's treatment might include antibiotics for the infection, as well as antihistamines or decongestants.  °REBOUND HEADACHES °Q: What is a rebound headache? What causes it? How can I treat it? °A: A pattern of taking acute headache medications too often can lead to a condition known as "rebound headache." A pattern of taking too much headache medication includes taking it more than 2 days per week or in excessive amounts. That means more than the label or a caregiver advises. With rebound headaches, your medications not only stop relieving pain, they actually begin to cause headaches. Doctors treat rebound headache by tapering the medication that is being overused. Sometimes your caregiver will gradually substitute a different type of treatment or medication. Stopping may be a challenge. Regularly overusing a medication increases the potential for serious side effects. Consult a caregiver  if you regularly use headache medications more than 2 days per week or more than the label advises. °ADDITIONAL QUESTIONS AND ANSWERS °Q: What is biofeedback? °A: Biofeedback is a self-help treatment. Biofeedback uses special equipment to monitor your body's involuntary physical responses. Biofeedback monitors: °· Breathing. °· Pulse. °· Heart rate. °· Temperature. °· Muscle tension. °· Brain activity. °Biofeedback helps you refine and perfect your relaxation exercises. You learn to control the physical responses that are related to stress. Once the technique has been mastered, you do not need the equipment any more. °Q: Are headaches hereditary? °A: Four out of five (80%) of people that suffer report a family history of migraine. Scientists are not sure if this is genetic or a family predisposition. Despite the uncertainty, a child has a 50% chance of having migraine if one parent suffers. The child has a 75% chance if both parents suffer.  °Q: Can children get headaches? °A: By the time they reach high school, most young people have experienced some type of headache. Many safe and effective approaches or medications can prevent a headache from occurring or stop it after it has begun.  °Q: What type of doctor should I see to diagnose and treat my headache? °A: Start with your primary caregiver. Discuss his or her experience and approach to headaches. Discuss methods of   classification, diagnosis, and treatment. Your caregiver may decide to recommend you to a headache specialist, depending upon your symptoms or other physical conditions. Having diabetes, allergies, etc., may require a more comprehensive and inclusive approach to your headache. The [employer] will provide, upon request, a list of NHF physician members in your state. °Document Released: 10/04/2003 Document Revised: 10/06/2011 Document Reviewed: 03/13/2008 °ExitCare® Patient Information ©2015 [employer]. This information is not  intended to replace advice given to you by your health care provider. Make sure you discuss any questions you have with your health care provider. ° °

## 2014-04-06 NOTE — Telephone Encounter (Signed)
Patient has not returned calls, will close out encounter.

## 2014-04-14 ENCOUNTER — Telehealth: Payer: Self-pay | Admitting: Neurology

## 2014-04-14 ENCOUNTER — Ambulatory Visit (INDEPENDENT_AMBULATORY_CARE_PROVIDER_SITE_OTHER): Payer: BC Managed Care – PPO | Admitting: Nurse Practitioner

## 2014-04-14 ENCOUNTER — Encounter: Payer: Self-pay | Admitting: Nurse Practitioner

## 2014-04-14 VITALS — BP 130/76 | HR 68 | Ht 64.0 in | Wt 154.0 lb

## 2014-04-14 DIAGNOSIS — G8929 Other chronic pain: Secondary | ICD-10-CM

## 2014-04-14 DIAGNOSIS — E785 Hyperlipidemia, unspecified: Secondary | ICD-10-CM

## 2014-04-14 DIAGNOSIS — Z87898 Personal history of other specified conditions: Secondary | ICD-10-CM

## 2014-04-14 DIAGNOSIS — R51 Headache: Secondary | ICD-10-CM

## 2014-04-14 MED ORDER — TOPIRAMATE 25 MG PO CPSP: 50.0000 mg | ORAL_CAPSULE | Freq: Two times a day (BID) | ORAL | Status: DC

## 2014-04-14 NOTE — Telephone Encounter (Signed)
Please let her know I will increase to 2 tabs twice daily

## 2014-04-14 NOTE — Telephone Encounter (Addendum)
Patient called to relay to Hoyle Sauer that she is taking Topamax 25mg  twice daily.

## 2014-04-14 NOTE — Patient Instructions (Signed)
Please call me with your current Topamax dose Given samples of Relpax use acutely Keep a record of your headaches and bring to next appointment Limit your narcotic use Followup in 3-6 months

## 2014-04-14 NOTE — Telephone Encounter (Signed)
Spoke to patient and relayed that she is to take Topamax 25mg  2 tabs twice a day, per [facility].  Patient verbalized understanding.

## 2014-04-14 NOTE — Progress Notes (Signed)
[facility]  PATIENT: Dorothy White DOB: 02/26/1951   REASON FOR VISIT: follow up for migraine   HISTORY OF PRESENT ILLNESS: Dorothy White, 63 year old returns for followup. She continues to have frequent headaches, she has had 2 ER visits her migraine since last seen in our office 10/11/2013. She was started on Topamax at her last visit here however it does not appear she started taking the medication until recently.It was started by another provider.  She is not sure what dose she is on. She takes Imitrex acutely but has used her limit for the month.She is limiting her over-the-counter medications. On a scale of 1-10 she rates her headaches most days at about a 5. She has nausea with her headaches. Previous  carotid Doppler was negative.  MRA of the brain has been normal. She had a stroke panel with cholesterol of 243 triglycerides at 230 and she was placed on Lipitor 20 mg.  She has not had followup labs. She has a significant history of depression and is seen by psychiatry. She also has problems with insomnia and is currently on trazodone. She has occasional  events of stumbling gait and  incoordination and difficulty getting her words out. CT of the brain was normal. MRI of the brain  with chronic small vessel disease no acute.  She was placed on baby aspirin. She returns for reevaluation.   HISTORY 07/11/13 acute onset unsteady gait  She had past medical and all history of depression, is on polypharmacy treatment, including Wellbutrin 450 mg a day, Prozac 60 mg a day, trazodone 200 and of grommet night for insomnia,  In December 8th 2014, around 5:30 PM, without clear triggers, she was noticed by her roommate to have stumbling gait, uncoordinated, disoriented, difficulty getting words out, she has trouble understanding people, she also has visual hallucinations, she saw spiderwebbed around door knob, she was taken by her roommate to the emergency room, CAT scan of the brain  was normal, laboratory evaluation including CBC, CMP was normal  Later she also noticed left side numbness involving her left arm, left leg, left foot, continued unsteady gait lasting for 4-5 days, overall she has much improved, but she is still not back to her baseline, she still complains of extreme fatigue, stumbling gait,     REVIEW OF SYSTEMS: Full 14 system review of systems performed and notable only for those listed, all others are neg:  Constitutional: N/A  Cardiovascular: N/A  Ear/Nose/Throat: N/A  Skin: N/A  Eyes: N/A  Respiratory: N/A  Gastroitestinal: N/A  Hematology/Lymphatic: N/A  Endocrine: N/A Musculoskeletal back pain Allergy/Immunology: N/A  Neurological:  Memory loss, headache  Psychiatric:  Depression treated by psychiatry Sleep : NA   ALLERGIES: Allergies  Allergen Reactions  . Penicillins Hives    HOME MEDICATIONS: Outpatient Prescriptions Prior to Visit  Medication Sig Dispense Refill  . aspirin EC 81 MG tablet Take 81 mg by mouth daily.      Marland Kitchen atorvastatin (LIPITOR) 20 MG tablet Take 20 mg by mouth at bedtime.      Marland Kitchen buPROPion (WELLBUTRIN XL) 150 MG 24 hr tablet Take 150 mg by mouth daily. Taken with 300mg  tablet to equal 450mg  daily.      Marland Kitchen buPROPion (WELLBUTRIN XL) 300 MG 24 hr tablet Take 300 mg by mouth daily. Taken with 150mg  tablet to equal 450mg  daily.      . calcium-vitamin D (OSCAL WITH D) 500-200 MG-UNIT per tablet Take 1 tablet by mouth daily with breakfast.      .  fexofenadine (ALLEGRA) 180 MG tablet Take 180 mg by mouth at bedtime.      Marland Kitchen FLUoxetine (PROZAC) 20 MG capsule Take 60 mg by mouth daily.       . fluticasone (FLONASE) 50 MCG/ACT nasal spray Place 2 sprays into both nostrils daily as needed for allergies.       Marland Kitchen ibuprofen (ADVIL,MOTRIN) 200 MG tablet Take 600 mg by mouth daily as needed for mild pain.      Marland Kitchen ketorolac (TORADOL) 15 MG/ML injection Inject 15 mg into the vein once.      . montelukast (SINGULAIR) 10 MG tablet Take  10 mg by mouth daily.       . promethazine (PHENERGAN) 25 MG/ML injection Inject 25 mg into the vein once.      . SUMAtriptan (IMITREX) 25 MG tablet Take 2 tablets (50 mg total) by mouth once. May take 50mg  at onset of migraine and repeat once after 2 hours. Maximum daily dose 200mg   30 tablet  0  . traZODone (DESYREL) 100 MG tablet Take 200 mg by mouth at bedtime.        No facility-administered medications prior to visit.    PAST MEDICAL HISTORY: Past Medical History  Diagnosis Date  . Arrhythmia   . Diverticulitis   . Depression   . Headache(784.0)   . Anxiety   . HLD (hyperlipidemia)     PAST SURGICAL HISTORY: Past Surgical History  Procedure Laterality Date  . Tonsillectomy    . Abdominal hysterectomy    . Breast enhancement surgery    . Rhinoplasty    . Oophorectomy Bilateral   . Bunionectomy Bilateral     FAMILY HISTORY: Family History  Problem Relation Age of Onset  . Lung cancer Mother   . Heart attack Father   . Hypertension Brother   . Esophageal cancer Brother   . Colon cancer Neg Hx   . Diabetes Paternal Grandmother     questionable  . Kidney disease Neg Hx   . Liver disease Neg Hx     SOCIAL HISTORY: History   Social History  . Marital Status: Divorced    Spouse Name: N/A    Number of Children: 0  . Years of Education: 12   Occupational History  . retired     Retired  .     Social History Main Topics  . Smoking status: Former Smoker    Types: Cigarettes    Quit date: 06/30/2011  . Smokeless tobacco: Never Used  . Alcohol Use: 2.3 oz/week    1 Drinks containing 0.5 oz of alcohol, 3 Glasses of wine per week     Comment: GLASS OF WINE WITH DINNER- 2-3 times a week  . Drug Use: No  . Sexual Activity: No   Other Topics Concern  . Not on file   Social History Narrative   Patient lives at home alone.    Patient is retired.   Education- High school.   Right handed.   Patient drinks one cup of coffee and a big glass of ice tea.      PHYSICAL EXAM  Filed Vitals:   04/14/14 0940  BP: 130/76  Pulse: 68  Height: 5\' 4"  (1.626 m)  Weight: 154 lb (69.854 kg)   Body mass index is 26.42 kg/(m^2). Generalized: Well developed, in no acute distress  Head: normocephalic and atraumatic,. Oropharynx benign  Neck: Supple, no carotid bruits  Musculoskeletal: No deformity  Neurological examination  Mentation: Alert oriented to time,  place, history taking. Follows all commands speech and language fluent  Cranial nerve II-XII: Fundoscopic exam reveals sharp disc margins.Pupils were equal round reactive to light extraocular movements were full, visual field were full on confrontational test. Facial sensation and strength were normal. hearing was intact to finger rubbing bilaterally. Uvula tongue midline. head turning and shoulder shrug were normal and symmetric.Tongue protrusion into cheek strength was normal.  Motor: normal bulk and tone, full strength in the BUE, BLE, No focal weakness  Coordination: finger-nose-finger, heel-to-shin bilaterally, no dysmetria  Reflexes: Brachioradialis 2/2, biceps 2/2, triceps 2/2, patellar 2/2, Achilles 2/2, plantar responses were flexor bilaterally.  Gait and Station: Rising up from seated position without assistance, normal stance, moderate stride, good arm swing, smooth turning, able to perform tiptoe, and heel walking without difficulty. Tandem gait is unsteady   DIAGNOSTIC DATA (LABS, IMAGING, TESTING) - I reviewed patient records, labs, notes, testing and imaging myself where available.  Lab Results  Component Value Date   WBC 5.5 03/29/2014   HGB 14.6 03/29/2014   HCT 43.0 03/29/2014   MCV 86.5 03/29/2014   PLT 224 03/29/2014      Component Value Date/Time   NA 141 03/29/2014 1626   K 4.1 03/29/2014 1626   CL 107 03/29/2014 1626   CO2 25 03/29/2014 1605   GLUCOSE 90 03/29/2014 1626   BUN 20 03/29/2014 1626   CREATININE 1.00 03/29/2014 1626   CALCIUM 9.7 03/29/2014 1605   CALCIUM 9.4 01/13/2007 2356    PROT 6.9 03/29/2014 1605   ALBUMIN 3.8 03/29/2014 1605   AST 15 03/29/2014 1605   ALT 16 03/29/2014 1605   ALKPHOS 108 03/29/2014 1605   BILITOT 0.3 03/29/2014 1605   GFRNONAA 66* 03/29/2014 1605   GFRAA 77* 03/29/2014 1605   Lab Results  Component Value Date   CHOL 143 04/13/2007   HDL 55 08/15/2013   LDLCALC 142* 08/15/2013   TRIG 230* 08/15/2013   CHOLHDL 3.6 CALC 04/13/2007   Lab Results  Component Value Date   HGBA1C 5.2 08/15/2013     ASSESSMENT AND PLAN  63 y.o. year old female  has a past medical history of stumbling gait, numbness of the left side of her body, chronic headache. MRI of the brain with nothing acute except chronic small vessel disease, MRA of the brain with normal. Carotid Dopplers are negative for significant stenosis. Lipid panel is elevated not been repeated. Patient is on low dose Topamax but does not dosage.    Please call me with your current Topamax dose Given samples of Relpax to use acutely Needs fasting lipid panel will do week after next on vacation next week Keep a record of your headaches and bring to next appointment Limit your narcotic use Followup in 3-6 months Dorothy White, [facility], APRN Late note. Topamax increased to 50mg  BID called in  [facility] [address], [place], [place] [address] [phone]

## 2014-04-16 ENCOUNTER — Other Ambulatory Visit: Payer: Self-pay | Admitting: Nurse Practitioner

## 2014-05-07 ENCOUNTER — Other Ambulatory Visit: Payer: Self-pay | Admitting: Neurology

## 2014-05-18 ENCOUNTER — Other Ambulatory Visit: Payer: Self-pay | Admitting: Family Medicine

## 2014-05-18 DIAGNOSIS — Z780 Asymptomatic menopausal state: Secondary | ICD-10-CM

## 2014-05-18 DIAGNOSIS — Z1231 Encounter for screening mammogram for malignant neoplasm of breast: Secondary | ICD-10-CM

## 2014-05-29 ENCOUNTER — Other Ambulatory Visit: Payer: Self-pay | Admitting: Neurology

## 2014-06-09 ENCOUNTER — Ambulatory Visit
Admission: RE | Admit: 2014-06-09 | Discharge: 2014-06-09 | Disposition: A | Discharge status: Home / Self Care | Payer: BC Managed Care – PPO | Source: Ambulatory Visit | Attending: Family Medicine | Admitting: Family Medicine

## 2014-06-09 DIAGNOSIS — Z1231 Encounter for screening mammogram for malignant neoplasm of breast: Secondary | ICD-10-CM

## 2014-06-09 DIAGNOSIS — Z780 Asymptomatic menopausal state: Secondary | ICD-10-CM

## 2014-06-20 ENCOUNTER — Encounter: Payer: Self-pay | Admitting: Neurology

## 2014-08-26 ENCOUNTER — Emergency Department (HOSPITAL_COMMUNITY)
Admission: EM | Admit: 2014-08-26 | Discharge: 2014-08-26 | Disposition: A | Discharge status: Home / Self Care | Payer: 59 | Attending: Emergency Medicine | Admitting: Emergency Medicine

## 2014-08-26 ENCOUNTER — Encounter (HOSPITAL_COMMUNITY): Payer: Self-pay

## 2014-08-26 DIAGNOSIS — Z87891 Personal history of nicotine dependence: Secondary | ICD-10-CM | POA: Insufficient documentation

## 2014-08-26 DIAGNOSIS — Z7982 Long term (current) use of aspirin: Secondary | ICD-10-CM | POA: Diagnosis not present

## 2014-08-26 DIAGNOSIS — Z79899 Other long term (current) drug therapy: Secondary | ICD-10-CM | POA: Insufficient documentation

## 2014-08-26 DIAGNOSIS — Z8673 Personal history of transient ischemic attack (TIA), and cerebral infarction without residual deficits: Secondary | ICD-10-CM | POA: Diagnosis not present

## 2014-08-26 DIAGNOSIS — F329 Major depressive disorder, single episode, unspecified: Secondary | ICD-10-CM | POA: Diagnosis not present

## 2014-08-26 DIAGNOSIS — Z8719 Personal history of other diseases of the digestive system: Secondary | ICD-10-CM | POA: Diagnosis not present

## 2014-08-26 DIAGNOSIS — M545 Low back pain: Secondary | ICD-10-CM | POA: Insufficient documentation

## 2014-08-26 DIAGNOSIS — G8929 Other chronic pain: Secondary | ICD-10-CM | POA: Diagnosis not present

## 2014-08-26 DIAGNOSIS — Z880 Allergy status to penicillin: Secondary | ICD-10-CM | POA: Diagnosis not present

## 2014-08-26 DIAGNOSIS — F419 Anxiety disorder, unspecified: Secondary | ICD-10-CM | POA: Diagnosis not present

## 2014-08-26 DIAGNOSIS — M549 Dorsalgia, unspecified: Secondary | ICD-10-CM | POA: Diagnosis present

## 2014-08-26 DIAGNOSIS — E785 Hyperlipidemia, unspecified: Secondary | ICD-10-CM | POA: Diagnosis not present

## 2014-08-26 HISTORY — DX: Other chronic pain: G89.29

## 2014-08-26 HISTORY — DX: Cerebral infarction, unspecified: I63.9

## 2014-08-26 HISTORY — DX: Dorsalgia, unspecified: M54.9

## 2014-08-26 MED ORDER — OXYCODONE-ACETAMINOPHEN 5-325 MG PO TABS: 2.0000 | ORAL_TABLET | ORAL | Status: DC | PRN

## 2014-08-26 NOTE — ED Notes (Signed)
Patient states she has chronic back pain and was prescribed Percocet. Patient states Percocet makes her nauseated. Paatient was prescribed Oxycontin and has not been approved by [employer] yet. Patient states she needs something for low back pain.

## 2014-08-26 NOTE — ED Provider Notes (Signed)
CSN: [id number]     Arrival date & time 08/26/14  1420 History  This chart was scribed for non-physician practitioner, Alyse Low, PA-C,working with Leota Jacobsen, MD, by Marlowe Kays, ED Scribe. This patient was seen in room WTR8/WTR8 and the patient's care was started at 3:58 PM.   Chief Complaint  Patient presents with  . Back Pain   The history is provided by the patient and medical records. No language interpreter was used.    Marland KitchenHPI Comments:  Dorothy White is a 64 y.o. female with PMH of chronic back pain who presents to the Emergency Department complaining of severe back pain that has been ongoing. She reports that she recently got new insurance and it now requires prior authorization of her Oxycontin that she normally takes. She reports she getting a new prescription from her PCP, Dr. Nelva Bush, four days ago but is still waiting on the insurance company to obtain the prior authorization. She states she picked up a box in the last couple of day which exacerbated her pain. Denies alleviating factors. Movement of any kind makes the pain worse. Denies any trauma, injury or fall. Denies fever, chills, numbness, tingling or weakness of the lower extremities, bowel or bladder incontinence. PMH of arrhythmia, diverticulitis, depression and anxiety, HLD and CVA. Pt states she does have a pain contract with her PCP.  Past Medical History  Diagnosis Date  . Arrhythmia   . Diverticulitis   . Depression   . Headache(784.0)   . Anxiety   . HLD (hyperlipidemia)   . Chronic back pain   . Stroke    Past Surgical History  Procedure Laterality Date  . Tonsillectomy    . Abdominal hysterectomy    . Breast enhancement surgery    . Rhinoplasty    . Oophorectomy Bilateral   . Bunionectomy Bilateral    Family History  Problem Relation Age of Onset  . Lung cancer Mother   . Heart attack Father   . Hypertension Brother   . Esophageal cancer Brother   . Colon cancer Neg Hx   . Diabetes  Paternal Grandmother     questionable  . Kidney disease Neg Hx   . Liver disease Neg Hx    History  Substance Use Topics  . Smoking status: Former Smoker    Types: Cigarettes    Quit date: 06/30/2011  . Smokeless tobacco: Never Used  . Alcohol Use: No   OB History    No data available     Review of Systems  Constitutional: Negative for fever and chills.  Gastrointestinal: Negative for nausea and vomiting.  Genitourinary:       No bowel or bladder incontinence.  Musculoskeletal: Positive for back pain.  Skin: Negative for color change and wound.  Neurological: Negative for weakness and numbness.  All other systems reviewed and are negative.   Allergies  Penicillins  Home Medications   Prior to Admission medications   Medication Sig Start Date End Date Taking? Authorizing Provider  aspirin EC 81 MG tablet Take 81 mg by mouth daily.    Historical Provider, MD  atorvastatin (LIPITOR) 20 MG tablet TAKE 1 TABLET BY MOUTH ONCE DAILY AT [facility] 05/07/14   Marcial Pacas, MD  buPROPion (WELLBUTRIN XL) 150 MG 24 hr tablet Take 150 mg by mouth daily. Taken with 300mg  tablet to equal 450mg  daily.    Historical Provider, MD  buPROPion (WELLBUTRIN XL) 300 MG 24 hr tablet Take 300 mg by mouth daily. Taken  with 150mg  tablet to equal 450mg  daily.    Historical Provider, MD  calcium-vitamin D (OSCAL WITH D) 500-200 MG-UNIT per tablet Take 1 tablet by mouth daily with breakfast.    Historical Provider, MD  fexofenadine (ALLEGRA) 180 MG tablet Take 180 mg by mouth at bedtime.    Historical Provider, MD  FLUoxetine (PROZAC) 20 MG capsule Take 60 mg by mouth daily.     Historical Provider, MD  fluticasone (FLONASE) 50 MCG/ACT nasal spray Place 2 sprays into both nostrils daily as needed for allergies.  10/17/13   Historical Provider, MD  ibuprofen (ADVIL,MOTRIN) 200 MG tablet Take 600 mg by mouth daily as needed for mild pain.    Historical Provider, MD  ketorolac (TORADOL) 15 MG/ML injection Inject 15  mg into the vein once.    Historical Provider, MD  montelukast (SINGULAIR) 10 MG tablet Take 10 mg by mouth daily.     Historical Provider, MD  promethazine (PHENERGAN) 25 MG/ML injection Inject 25 mg into the vein once.    Historical Provider, MD  SUMAtriptan (IMITREX) 25 MG tablet Take 2 tablets (50 mg total) by mouth once. May take 50mg  at onset of migraine and repeat once after 2 hours. Maximum daily dose 200mg  03/31/14   Noland Fordyce, PA-C  topiramate (TOPAMAX) 25 MG capsule Take 2 capsules (50 mg total) by mouth 2 (two) times daily. 04/14/14   Dennie Bible, NP  traZODone (DESYREL) 100 MG tablet Take 200 mg by mouth at bedtime.     Historical Provider, MD   Triage Vitals: BP 142/62 mmHg  Pulse 73  Temp(Src) 98.6 F (37 C) (Oral)  Resp 16  SpO2 95% Physical Exam  Constitutional: She is oriented to person, place, and time. She appears well-developed and well-nourished.  HENT:  Head: Normocephalic and atraumatic.  Eyes: EOM are normal.  Neck: Normal range of motion.  Cardiovascular: Normal rate, regular rhythm and normal heart sounds.  Exam reveals no gallop and no friction rub.   No murmur heard. Pulmonary/Chest: Effort normal and breath sounds normal. No respiratory distress. She has no wheezes. She has no rales.  Musculoskeletal: Normal range of motion.  Diffusely tender to palpation to lumbar spine. Discomfort with movement.  Neurological: She is alert and oriented to person, place, and time.  Skin: Skin is warm and dry.  Psychiatric: She has a normal mood and affect. Her behavior is normal.  Nursing note and vitals reviewed.   ED Course  Procedures (including critical care time) DIAGNOSTIC STUDIES: Oxygen Saturation is 95% on RA, adequate by my interpretation.   COORDINATION OF CARE: 4:04 PM- Will give prescription for Percocet and informed pt that she must inform PCP of this due to pain management contract. Pt verbalizes understanding and agrees to  plan.  Medications - No data to display  Labs Review Labs Reviewed - No data to display  Imaging Review No results found.   EKG Interpretation None      MDM   Final diagnoses:  Low back pain without sciatica, unspecified back pain laterality      I personally performed the services described in this documentation, which was scribed in my presence. The recorded information has been reviewed and is accurate.  Percocet rx See your Physician for recheck   Fransico Meadow, PA-C 08/26/14 2025  Leota Jacobsen, MD 08/27/14 [phone]

## 2014-08-26 NOTE — Discharge Instructions (Signed)
Sciatica Sciatica is pain, weakness, numbness, or tingling along the path of the sciatic nerve. The nerve starts in the lower back and runs down the back of each leg. The nerve controls the muscles in the lower leg and in the back of the knee, while also providing sensation to the back of the thigh, lower leg, and the sole of your foot. Sciatica is a symptom of another medical condition. For instance, nerve damage or certain conditions, such as a herniated disk or bone spur on the spine, pinch or put pressure on the sciatic nerve. This causes the pain, weakness, or other sensations normally associated with sciatica. Generally, sciatica only affects one side of the body. CAUSES   Herniated or slipped disc.  Degenerative disk disease.  A pain disorder involving the narrow muscle in the buttocks (piriformis syndrome).  Pelvic injury or fracture.  Pregnancy.  Tumor (rare). SYMPTOMS  Symptoms can vary from mild to very severe. The symptoms usually travel from the low back to the buttocks and down the back of the leg. Symptoms can include:  Mild tingling or dull aches in the lower back, leg, or hip.  Numbness in the back of the calf or sole of the foot.  Burning sensations in the lower back, leg, or hip.  Sharp pains in the lower back, leg, or hip.  Leg weakness.  Severe back pain inhibiting movement. These symptoms may get worse with coughing, sneezing, laughing, or prolonged sitting or standing. Also, being overweight may worsen symptoms. DIAGNOSIS  Your caregiver will perform a physical exam to look for common symptoms of sciatica. He or she may ask you to do certain movements or activities that would trigger sciatic nerve pain. Other tests may be performed to find the cause of the sciatica. These may include:  Blood tests.  X-rays.  Imaging tests, such as an MRI or CT scan. TREATMENT  Treatment is directed at the cause of the sciatic pain. Sometimes, treatment is not necessary  and the pain and discomfort goes away on its own. If treatment is needed, your caregiver may suggest:  Over-the-counter medicines to relieve pain.  Prescription medicines, such as anti-inflammatory medicine, muscle relaxants, or narcotics.  Applying heat or ice to the painful area.  Steroid injections to lessen pain, irritation, and inflammation around the nerve.  Reducing activity during periods of pain.  Exercising and stretching to strengthen your abdomen and improve flexibility of your spine. Your caregiver may suggest losing weight if the extra weight makes the back pain worse.  Physical therapy.  Surgery to eliminate what is pressing or pinching the nerve, such as a bone spur or part of a herniated disk. HOME CARE INSTRUCTIONS   Only take over-the-counter or prescription medicines for pain or discomfort as directed by your caregiver.  Apply ice to the affected area for 20 minutes, 3-4 times a day for the first 48-72 hours. Then try heat in the same way.  Exercise, stretch, or perform your usual activities if these do not aggravate your pain.  Attend physical therapy sessions as directed by your caregiver.  Keep all follow-up appointments as directed by your caregiver.  Do not wear high heels or shoes that do not provide proper support.  Check your mattress to see if it is too soft. A firm mattress may lessen your pain and discomfort. SEEK IMMEDIATE MEDICAL CARE IF:   You lose control of your bowel or bladder (incontinence).  You have increasing weakness in the lower back, pelvis, buttocks,   or legs.  You have redness or swelling of your back.  You have a burning sensation when you urinate.  You have pain that gets worse when you lie down or awakens you at night.  Your pain is worse than you have experienced in the past.  Your pain is lasting longer than 4 weeks.  You are suddenly losing weight without reason. MAKE SURE YOU:  Understand these  instructions.  Will watch your condition.  Will get help right away if you are not doing well or get worse. Document Released: 07/08/2001 Document Revised: 01/13/2012 Document Reviewed: 11/23/2011 [employer] Patient Information 2015 [employer]. This information is not intended to replace advice given to you by your health care provider. Make sure you discuss any questions you have with your health care provider.  

## 2014-09-15 ENCOUNTER — Other Ambulatory Visit: Payer: Self-pay | Admitting: Family Medicine

## 2014-09-15 DIAGNOSIS — R51 Headache: Principal | ICD-10-CM

## 2014-09-15 DIAGNOSIS — R519 Headache, unspecified: Secondary | ICD-10-CM

## 2014-09-28 ENCOUNTER — Ambulatory Visit
Admission: RE | Admit: 2014-09-28 | Discharge: 2014-09-28 | Disposition: A | Discharge status: Home / Self Care | Payer: 59 | Source: Ambulatory Visit | Attending: Family Medicine | Admitting: Family Medicine

## 2014-09-28 DIAGNOSIS — R51 Headache: Principal | ICD-10-CM

## 2014-09-28 DIAGNOSIS — R519 Headache, unspecified: Secondary | ICD-10-CM

## 2014-10-26 ENCOUNTER — Ambulatory Visit
Admission: RE | Admit: 2014-10-26 | Discharge: 2014-10-26 | Disposition: A | Discharge status: Home / Self Care | Payer: 59 | Source: Ambulatory Visit | Attending: Family Medicine | Admitting: Family Medicine

## 2014-10-26 ENCOUNTER — Other Ambulatory Visit: Payer: Self-pay | Admitting: Family Medicine

## 2014-10-26 DIAGNOSIS — M7989 Other specified soft tissue disorders: Secondary | ICD-10-CM

## 2014-10-27 ENCOUNTER — Other Ambulatory Visit: Payer: Self-pay | Admitting: Family Medicine

## 2014-10-27 DIAGNOSIS — M79604 Pain in right leg: Secondary | ICD-10-CM

## 2014-10-27 DIAGNOSIS — M79601 Pain in right arm: Secondary | ICD-10-CM

## 2014-11-03 ENCOUNTER — Ambulatory Visit
Admission: RE | Admit: 2014-11-03 | Discharge: 2014-11-03 | Disposition: A | Discharge status: Home / Self Care | Payer: 59 | Source: Ambulatory Visit | Attending: Family Medicine | Admitting: Family Medicine

## 2014-11-03 DIAGNOSIS — M79601 Pain in right arm: Secondary | ICD-10-CM

## 2014-11-23 IMAGING — CR DG CHEST 2V
2 series · 2 of 2 positions shown · non-contrast
Comparison: August 07, 2007.

CLINICAL DATA: Cough and fever.

CHEST - 2 VIEW

[w chest pa]
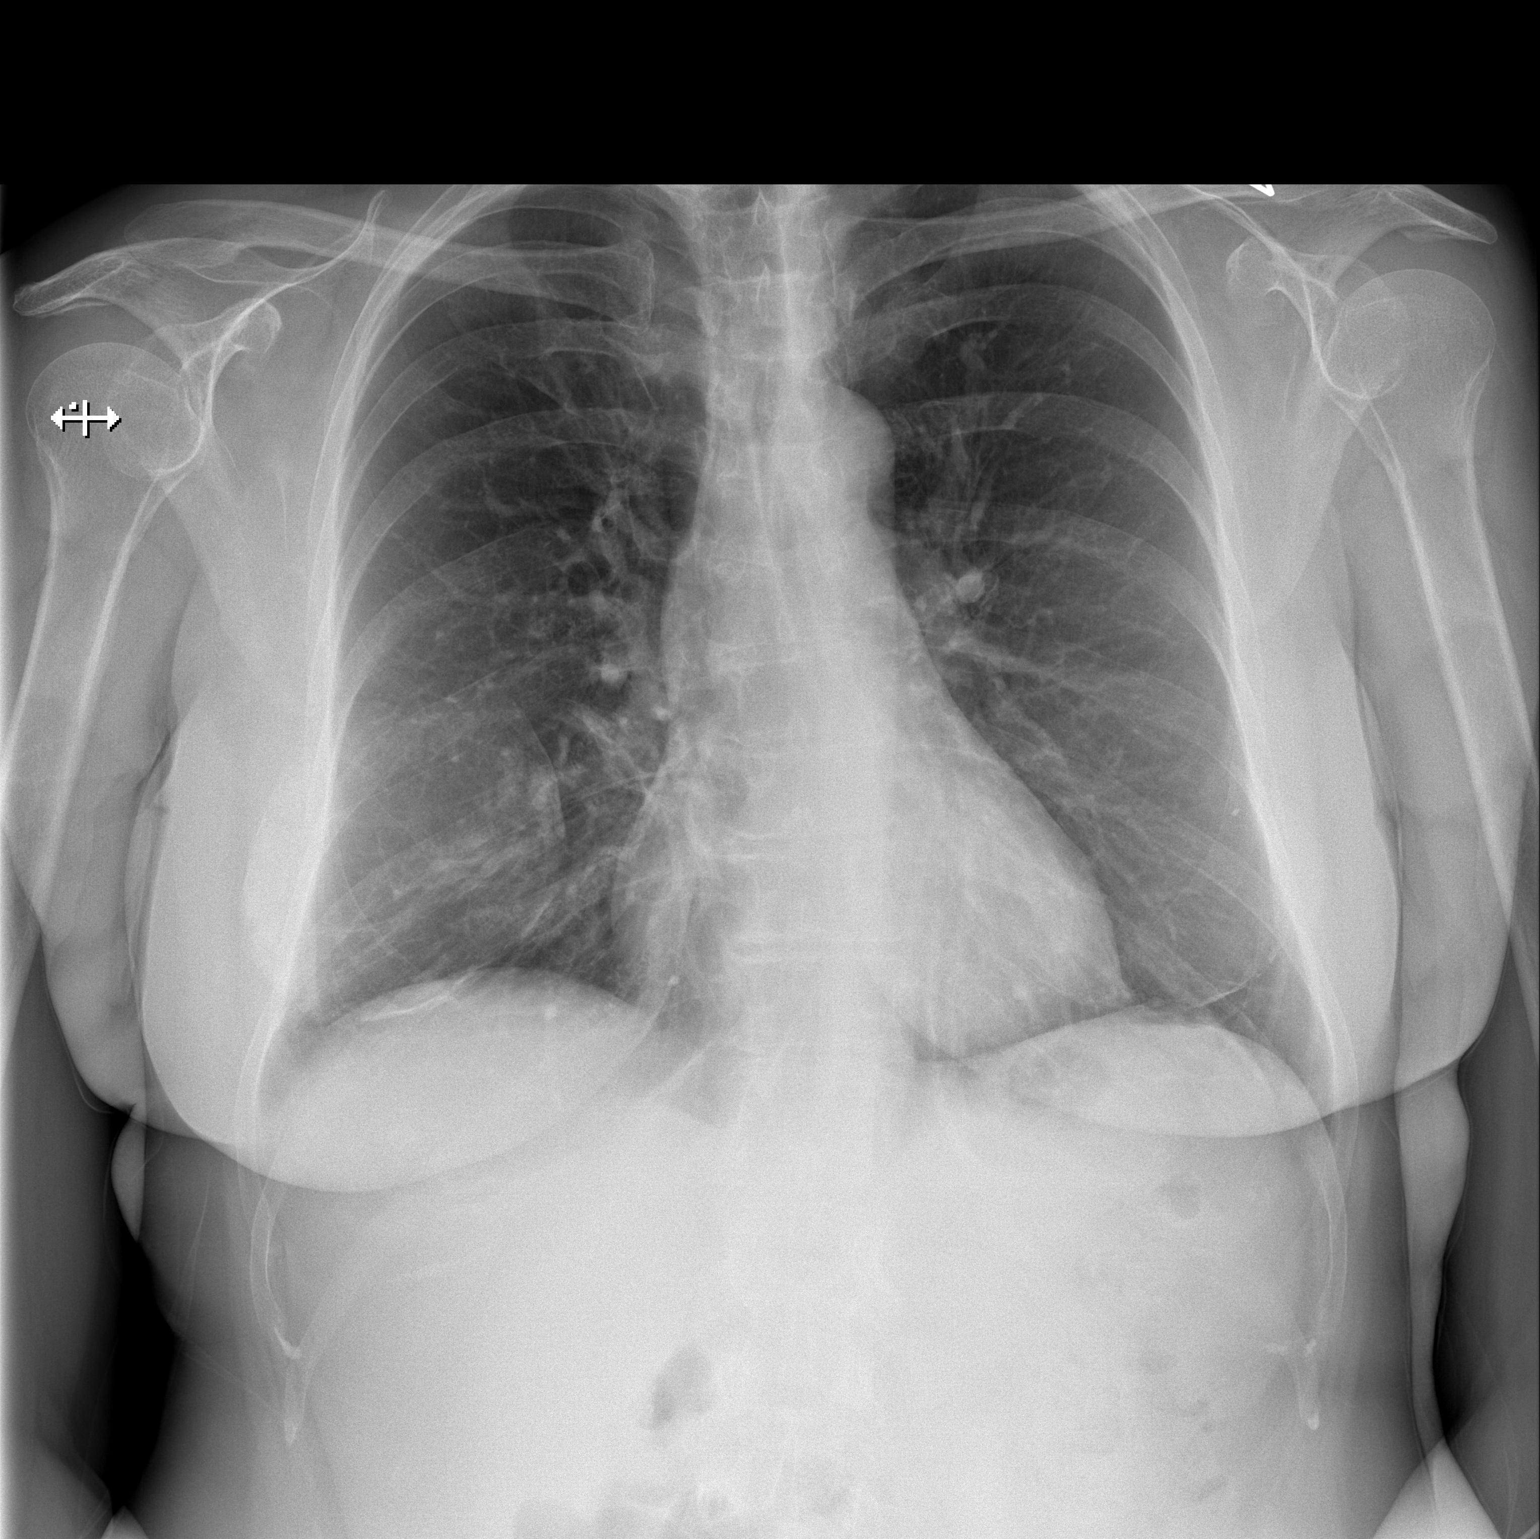

[w chest lat]
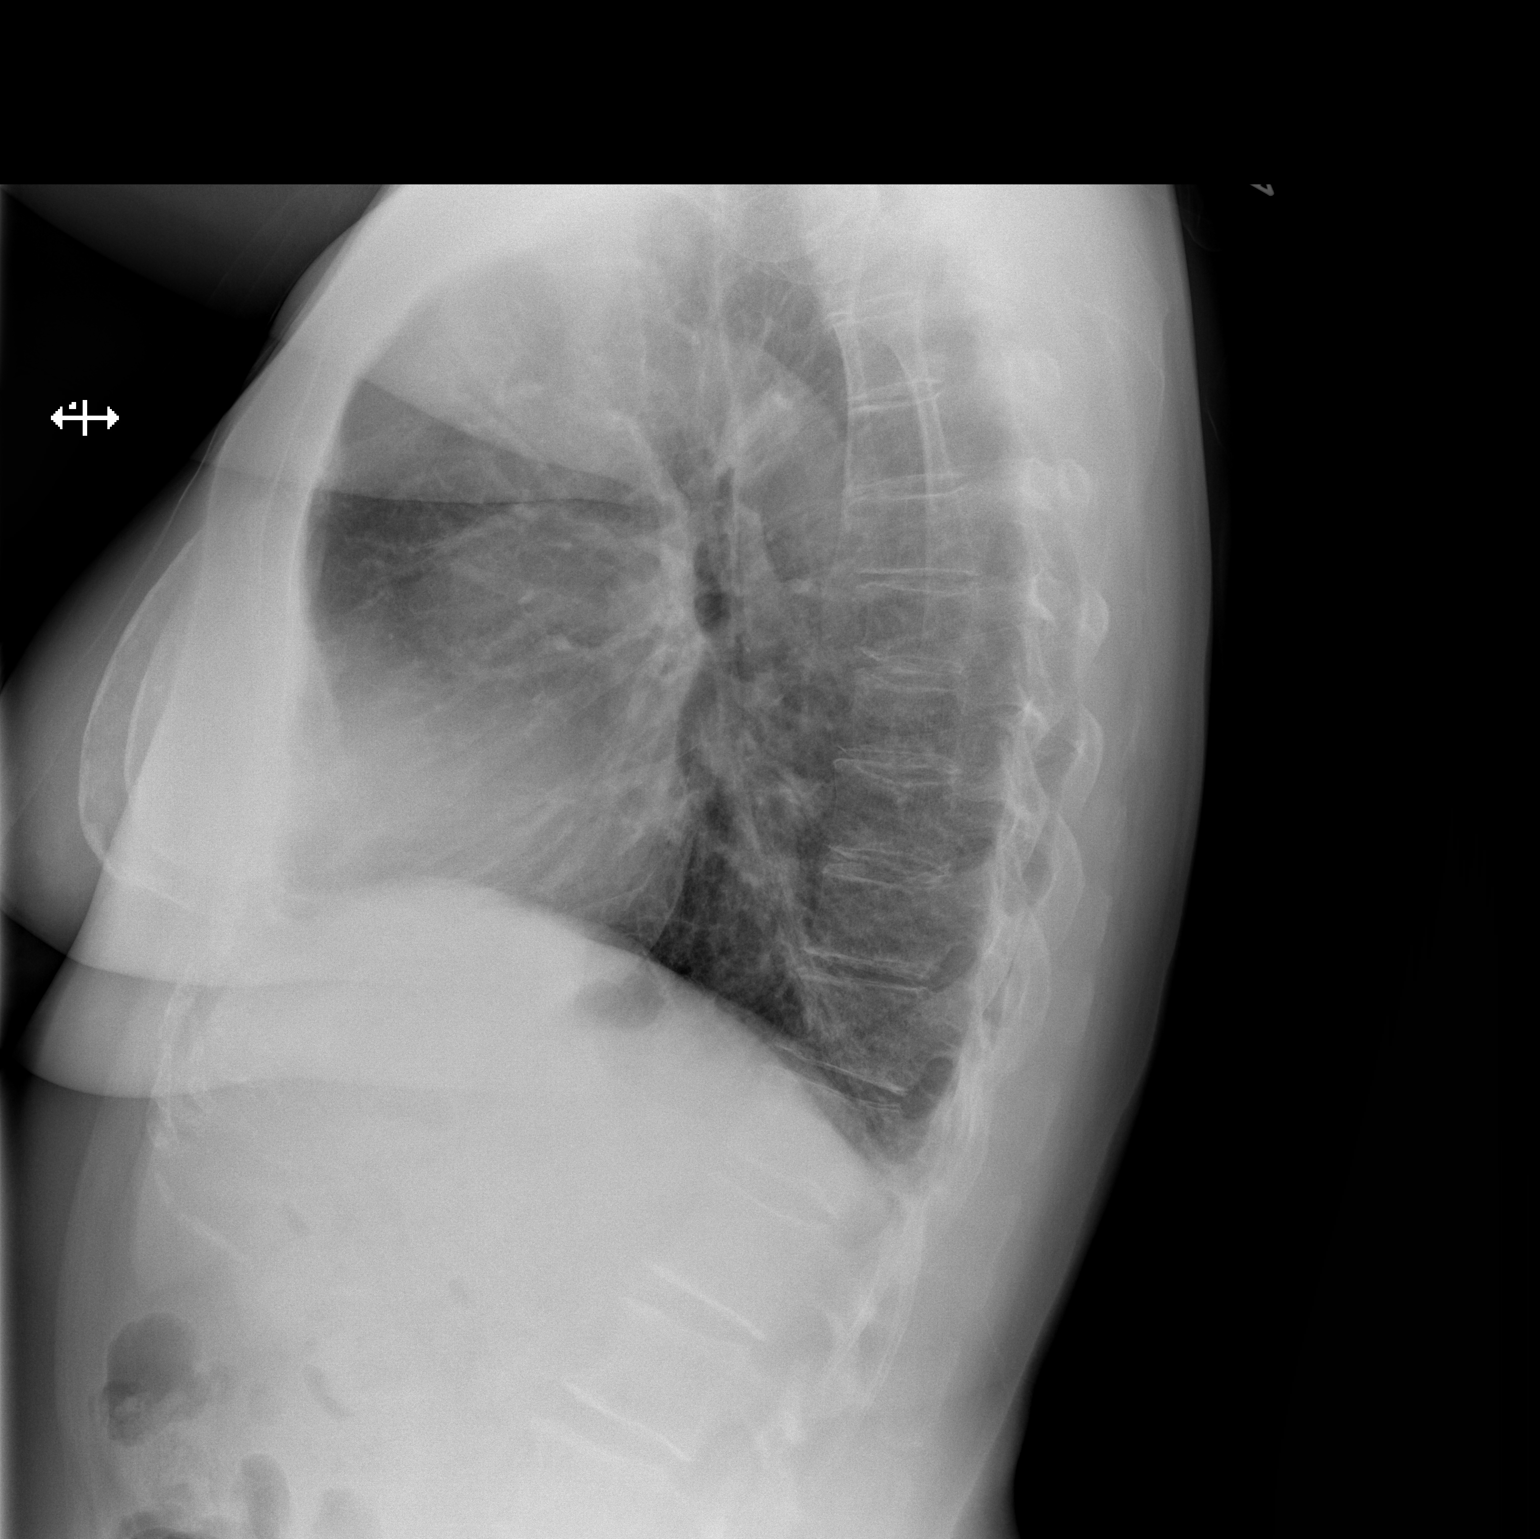

[2 of 2 positions shown; findings below may reference images not displayed]

FINDINGS: Cardiomediastinal silhouette appears normal.  No acute
pulmonary disease is noted.  Bony thorax is intact.
IMPRESSION: No acute cardiopulmonary abnormality seen.

## 2014-12-13 ENCOUNTER — Encounter: Payer: Self-pay | Admitting: Internal Medicine

## 2014-12-19 ENCOUNTER — Emergency Department (HOSPITAL_COMMUNITY)
Admission: EM | Admit: 2014-12-19 | Discharge: 2014-12-19 | Disposition: A | Discharge status: Home / Self Care | Payer: 59 | Attending: Emergency Medicine | Admitting: Emergency Medicine

## 2014-12-19 ENCOUNTER — Encounter (HOSPITAL_COMMUNITY): Payer: Self-pay | Admitting: Emergency Medicine

## 2014-12-19 ENCOUNTER — Emergency Department (HOSPITAL_COMMUNITY): Payer: 59

## 2014-12-19 DIAGNOSIS — Z880 Allergy status to penicillin: Secondary | ICD-10-CM | POA: Diagnosis not present

## 2014-12-19 DIAGNOSIS — Z79899 Other long term (current) drug therapy: Secondary | ICD-10-CM | POA: Insufficient documentation

## 2014-12-19 DIAGNOSIS — Z7982 Long term (current) use of aspirin: Secondary | ICD-10-CM | POA: Insufficient documentation

## 2014-12-19 DIAGNOSIS — Z8673 Personal history of transient ischemic attack (TIA), and cerebral infarction without residual deficits: Secondary | ICD-10-CM | POA: Insufficient documentation

## 2014-12-19 DIAGNOSIS — E785 Hyperlipidemia, unspecified: Secondary | ICD-10-CM | POA: Insufficient documentation

## 2014-12-19 DIAGNOSIS — R079 Chest pain, unspecified: Secondary | ICD-10-CM | POA: Insufficient documentation

## 2014-12-19 DIAGNOSIS — F419 Anxiety disorder, unspecified: Secondary | ICD-10-CM | POA: Insufficient documentation

## 2014-12-19 DIAGNOSIS — R091 Pleurisy: Secondary | ICD-10-CM | POA: Diagnosis not present

## 2014-12-19 DIAGNOSIS — Z87891 Personal history of nicotine dependence: Secondary | ICD-10-CM | POA: Diagnosis not present

## 2014-12-19 DIAGNOSIS — Z8679 Personal history of other diseases of the circulatory system: Secondary | ICD-10-CM | POA: Insufficient documentation

## 2014-12-19 DIAGNOSIS — G8929 Other chronic pain: Secondary | ICD-10-CM | POA: Insufficient documentation

## 2014-12-19 DIAGNOSIS — F329 Major depressive disorder, single episode, unspecified: Secondary | ICD-10-CM | POA: Insufficient documentation

## 2014-12-19 DIAGNOSIS — Z8719 Personal history of other diseases of the digestive system: Secondary | ICD-10-CM | POA: Insufficient documentation

## 2014-12-19 LAB — BASIC METABOLIC PANEL
ANION GAP: 10 (ref 5–15)
BUN: 8 mg/dL (ref 6–20)
CO2: 23 mmol/L (ref 22–32)
Calcium: 9.4 mg/dL (ref 8.9–10.3)
Chloride: 108 mmol/L (ref 101–111)
Creatinine, Ser: 0.78 mg/dL (ref 0.44–1.00)
GFR calc Af Amer: >60 mL/min (ref >60)
GFR calc non Af Amer: >60 mL/min (ref >60)
Glucose, Bld: 103 mg/dL — ABNORMAL HIGH (ref 65–99)
Potassium: 4.1 mmol/L (ref 3.5–5.1)
SODIUM: 141 mmol/L (ref 135–145)

## 2014-12-19 LAB — CBC
HEMATOCRIT: 40.8 % (ref 36.0–46.0)
Hemoglobin: 13.1 g/dL (ref 12.0–15.0)
MCH: 28.3 pg (ref 26.0–34.0)
MCHC: 32.1 g/dL (ref 30.0–36.0)
MCV: 88.1 fL (ref 78.0–100.0)
Platelets: 310 10*3/uL (ref 150–400)
RBC: 4.63 MIL/uL (ref 3.87–5.11)
RDW: 13.8 % (ref 11.5–15.5)
WBC: 6.6 10*3/uL (ref 4.0–10.5)

## 2014-12-19 LAB — I-STAT TROPONIN, ED: Troponin i, poc: 0 ng/mL (ref 0.00–0.08)

## 2014-12-19 MED ORDER — IBUPROFEN 400 MG PO TABS: 400.0000 mg | ORAL_TABLET | Freq: Four times a day (QID) | ORAL | Status: DC | PRN

## 2014-12-19 MED ORDER — IOHEXOL 350 MG/ML SOLN
100.0000 mL | Freq: Once | INTRAVENOUS | Status: AC | PRN
  Administered 2014-12-19: 100 mL via INTRAVENOUS

## 2014-12-19 NOTE — ED Notes (Signed)
Pt left without getting paperwork and self-removed IV which was found lying on side table in room.

## 2014-12-19 NOTE — Discharge Instructions (Signed)
Chest Pain (Nonspecific) °It is often hard to give a specific diagnosis for the cause of chest pain. There is always a chance that your pain could be related to something serious, such as a heart attack or a blood clot in the lungs. You need to follow up with your health care provider for further evaluation. °CAUSES  °· Heartburn. °· Pneumonia or bronchitis. °· Anxiety or stress. °· Inflammation around your heart (pericarditis) or lung (pleuritis or pleurisy). °· A blood clot in the lung. °· A collapsed lung (pneumothorax). It can develop suddenly on its own (spontaneous pneumothorax) or from trauma to the chest. °· Shingles infection (herpes zoster virus). °The chest wall is composed of bones, muscles, and cartilage. Any of these can be the source of the pain. °· The bones can be bruised by injury. °· The muscles or cartilage can be strained by coughing or overwork. °· The cartilage can be affected by inflammation and become sore (costochondritis). °DIAGNOSIS  °Lab tests or other studies may be needed to find the cause of your pain. Your health care provider may have you take a test called an ambulatory electrocardiogram (ECG). An ECG records your heartbeat patterns over a 24-hour period. You may also have other tests, such as: °· Transthoracic echocardiogram (TTE). During echocardiography, sound waves are used to evaluate how blood flows through your heart. °· Transesophageal echocardiogram (TEE). °· Cardiac monitoring. This allows your health care provider to monitor your heart rate and rhythm in real time. °· Holter monitor. This is a portable device that records your heartbeat and can help diagnose heart arrhythmias. It allows your health care provider to track your heart activity for several days, if needed. °· Stress tests by exercise or by giving medicine that makes the heart beat faster. °TREATMENT  °· Treatment depends on what may be causing your chest pain. Treatment may include: °· Acid blockers for  heartburn. °· Anti-inflammatory medicine. °· Pain medicine for inflammatory conditions. °· Antibiotics if an infection is present. °· You may be advised to change lifestyle habits. This includes stopping smoking and avoiding alcohol, caffeine, and chocolate. °· You may be advised to keep your head raised (elevated) when sleeping. This reduces the chance of acid going backward from your stomach into your esophagus. °Most of the time, nonspecific chest pain will improve within 2-3 days with rest and mild pain medicine.  °HOME CARE INSTRUCTIONS  °· If antibiotics were prescribed, take them as directed. Finish them even if you start to feel better. °· For the next few days, avoid physical activities that bring on chest pain. Continue physical activities as directed. °· Do not use any tobacco products, including cigarettes, chewing tobacco, or electronic cigarettes. °· Avoid drinking alcohol. °· Only take medicine as directed by your health care provider. °· Follow your health care provider's suggestions for further testing if your chest pain does not go away. °· Keep any follow-up appointments you made. If you do not go to an appointment, you could develop lasting (chronic) problems with pain. If there is any problem keeping an appointment, call to reschedule. °SEEK MEDICAL CARE IF:  °· Your chest pain does not go away, even after treatment. °· You have a rash with blisters on your chest. °· You have a fever. °SEEK IMMEDIATE MEDICAL CARE IF:  °· You have increased chest pain or pain that spreads to your arm, neck, jaw, back, or abdomen. °· You have shortness of breath. °· You have an increasing cough, or you cough   up blood. °· You have severe back or abdominal pain. °· You feel nauseous or vomit. °· You have severe weakness. °· You faint. °· You have chills. °This is an emergency. Do not wait to see if the pain will go away. Get medical help at once. Call your local emergency services (911 in [place] Do not drive  yourself to the hospital. °MAKE SURE YOU:  °· Understand these instructions. °· Will watch your condition. °· Will get help right away if you are not doing well or get worse. °Document Released: 04/23/2005 Document Revised: 07/19/2013 Document Reviewed: 02/17/2008 °[employer]® Patient Information ©2015 [employer]. This information is not intended to replace advice given to you by your health care provider. Make sure you discuss any questions you have with your health care provider. °Pleurisy °Pleurisy is an inflammation and swelling of the lining of the lungs (pleura). Because of this inflammation, it hurts to breathe. It can be aggravated by coughing, laughing, or deep breathing. Pleurisy is often caused by an underlying infection or disease.  °HOME CARE INSTRUCTIONS  °Monitor your pleurisy for any changes. The following actions may help to alleviate any discomfort you are experiencing: °· Medicine may help with pain. Only take over-the-counter or prescription medicines for pain, discomfort, or fever as directed by your health care provider. °· Only take antibiotic medicine as directed. Make sure to finish it even if you start to feel better. °SEEK MEDICAL CARE IF:  °· Your pain is not controlled with medicine or is increasing. °· You have an increase in pus-like (purulent) secretions brought up with coughing. °SEEK IMMEDIATE MEDICAL CARE IF:  °· You have blue or dark lips, fingernails, or toenails. °· You are coughing up blood. °· You have increased difficulty breathing. °· You have continuing pain unrelieved by medicine or pain lasting more than 1 week. °· You have pain that radiates into your neck, arms, or jaw. °· You develop increased shortness of breath or wheezing. °· You develop a fever, rash, vomiting, fainting, or other serious symptoms. °MAKE SURE YOU: °· Understand these instructions.   °· Will watch your condition.   °· Will get help right away if you are not doing well or get worse. °  °Document  Released: 07/14/2005 Document Revised: 03/16/2013 Document Reviewed: 12/26/2012 °[employer]® Patient Information ©2015 [employer]. This information is not intended to replace advice given to you by your health care provider. Make sure you discuss any questions you have with your health care provider. ° °

## 2014-12-19 NOTE — ED Notes (Addendum)
Pt reports chest pain starting Thursday or Friday and says pain has increased recently. Pain is only alleviated by lying down. Pt also says she has pain in inspiration and it is described as "squeezing." Endorses SOB and lightheadedness. Denies new back pain. Pain is under the left breast and and around left side of rib cage. Denies radiation. Denies recent sickness or pneumonia. Diagnosed with a cardiac arrhythmia in her 92s or 30s. No other c/c. Denies pain sitting in the triage chair at this moment. Not on blood thinners. Takes Aspirin "when I remember to." Also has had increased fatigue recently without any particular reason. Denies increase in anxiety recently.

## 2014-12-19 NOTE — ED Notes (Signed)
Patient transported to CT 

## 2014-12-19 NOTE — ED Notes (Signed)
Pt, sent by Justin Mend MD, r/o PE and Lung CA.  C/o sudden chest pain, SOB, and weight loss.

## 2014-12-19 NOTE — ED Provider Notes (Signed)
CSN: [id number]     Arrival date & time 12/19/14  1216 History   First MD Initiated Contact with Patient 12/19/14 1236     Chief Complaint  Patient presents with  . Chest Pain     (Consider location/radiation/quality/duration/timing/severity/associated sxs/prior Treatment) HPI Comments: Pt here with 4 day h/o left sided chest pain that's worse with prolonged exertion, pain is pleuritic No cough or fever, denies leg pain or swelling, no prior h/o dvt Sx better with rest--denies wheezing but does become light headed Went to her pcp today and sent here for r/o pe vs lung, denies hemoptysis--no palpitations  Patient is a 64 y.o. female presenting with chest pain. The history is provided by the patient.  Chest Pain   Past Medical History  Diagnosis Date  . Arrhythmia   . Diverticulitis   . Depression   . Headache(784.0)   . Anxiety   . HLD (hyperlipidemia)   . Chronic back pain   . Stroke    Past Surgical History  Procedure Laterality Date  . Tonsillectomy    . Abdominal hysterectomy    . Breast enhancement surgery    . Rhinoplasty    . Oophorectomy Bilateral   . Bunionectomy Bilateral    Family History  Problem Relation Age of Onset  . Lung cancer Mother   . Heart attack Father   . Hypertension Brother   . Esophageal cancer Brother   . Colon cancer Neg Hx   . Diabetes Paternal Grandmother     questionable  . Kidney disease Neg Hx   . Liver disease Neg Hx    History  Substance Use Topics  . Smoking status: Former Smoker    Types: Cigarettes    Quit date: 06/30/2011  . Smokeless tobacco: Never Used  . Alcohol Use: No   OB History    No data available     Review of Systems  Cardiovascular: Positive for chest pain.  All other systems reviewed and are negative.     Allergies  Penicillins  Home Medications   Prior to Admission medications   Medication Sig Start Date End Date Taking? Authorizing Provider  aspirin EC 81 MG tablet Take 81 mg by mouth  daily.    Historical Provider, MD  atorvastatin (LIPITOR) 20 MG tablet TAKE 1 TABLET BY MOUTH ONCE DAILY AT [facility] 05/07/14   Marcial Pacas, MD  buPROPion (WELLBUTRIN XL) 150 MG 24 hr tablet Take 150 mg by mouth daily. Taken with 300mg  tablet to equal 450mg  daily.    Historical Provider, MD  buPROPion (WELLBUTRIN XL) 300 MG 24 hr tablet Take 300 mg by mouth daily. Taken with 150mg  tablet to equal 450mg  daily.    Historical Provider, MD  calcium-vitamin D (OSCAL WITH D) 500-200 MG-UNIT per tablet Take 1 tablet by mouth daily with breakfast.    Historical Provider, MD  fexofenadine (ALLEGRA) 180 MG tablet Take 180 mg by mouth at bedtime.    Historical Provider, MD  FLUoxetine (PROZAC) 20 MG capsule Take 60 mg by mouth daily.     Historical Provider, MD  fluticasone (FLONASE) 50 MCG/ACT nasal spray Place 2 sprays into both nostrils daily as needed for allergies.  10/17/13   Historical Provider, MD  ibuprofen (ADVIL,MOTRIN) 200 MG tablet Take 600 mg by mouth daily as needed for mild pain.    Historical Provider, MD  ketorolac (TORADOL) 15 MG/ML injection Inject 15 mg into the vein once.    Historical Provider, MD  montelukast (SINGULAIR) 10 MG  tablet Take 10 mg by mouth daily.     Historical Provider, MD  oxyCODONE-acetaminophen (PERCOCET/ROXICET) 5-325 MG per tablet Take 2 tablets by mouth every 4 (four) hours as needed for severe pain. 08/26/14   Fransico Meadow, PA-C  promethazine (PHENERGAN) 25 MG/ML injection Inject 25 mg into the vein once.    Historical Provider, MD  SUMAtriptan (IMITREX) 25 MG tablet Take 2 tablets (50 mg total) by mouth once. May take 50mg  at onset of migraine and repeat once after 2 hours. Maximum daily dose 200mg  03/31/14   Noland Fordyce, PA-C  topiramate (TOPAMAX) 25 MG capsule Take 2 capsules (50 mg total) by mouth 2 (two) times daily. 04/14/14   Dennie Bible, NP  traZODone (DESYREL) 100 MG tablet Take 200 mg by mouth at bedtime.     Historical Provider, MD   BP 145/70 mmHg   Pulse 78  Temp(Src) 98.1 F (36.7 C) (Oral)  Resp 16  SpO2 100% Physical Exam  Constitutional: She is oriented to person, place, and time. She appears well-developed and well-nourished.  Non-toxic appearance. No distress.  HENT:  Head: Normocephalic and atraumatic.  Eyes: Conjunctivae, EOM and lids are normal. Pupils are equal, round, and reactive to light.  Neck: Normal range of motion. Neck supple. No tracheal deviation present. No thyroid mass present.  Cardiovascular: Normal rate, regular rhythm and normal heart sounds.  Exam reveals no gallop.   No murmur heard. Pulmonary/Chest: Effort normal and breath sounds normal. No stridor. No respiratory distress. She has no decreased breath sounds. She has no wheezes. She has no rhonchi. She has no rales.  Abdominal: Soft. Normal appearance and bowel sounds are normal. She exhibits no distension. There is no tenderness. There is no rebound and no CVA tenderness.  Musculoskeletal: Normal range of motion. She exhibits no edema or tenderness.  Neurological: She is alert and oriented to person, place, and time. She has normal strength. No cranial nerve deficit or sensory deficit. GCS eye subscore is 4. GCS verbal subscore is 5. GCS motor subscore is 6.  Skin: Skin is warm and dry. No abrasion and no rash noted.  Psychiatric: She has a normal mood and affect. Her speech is normal and behavior is normal.  Nursing note and vitals reviewed.   ED Course  Procedures (including critical care time) Labs Review Labs Reviewed  [place], ED    Imaging Review No results found.   EKG Interpretation   Date/Time:  Tuesday Dec 19 2014 12:31:20 EDT Ventricular Rate:  75 PR Interval:    QRS Duration: 82 QT Interval:  495 QTC Calculation: 553 R Axis:   70 Text Interpretation:  sinus rythm Borderline repolarization abnormality  Prolonged QT interval Baseline wander in lead(s) V1 changes new when  compared to  prior Confirmed by Dajane Valli  MD, Erhardt Dada ([id number]) on 12/19/2014  12:45:37 PM      MDM   Final diagnoses:  Chest pain    Chest CT negative for PE. Upon further speaking with the patient there are no anginal qualities to her symptoms. She is able to do her ADLs without become short of breath. Will follow with her doctor.   Lacretia Leigh, MD 12/19/14 [phone]

## 2015-02-04 ENCOUNTER — Encounter (HOSPITAL_COMMUNITY): Payer: Self-pay | Admitting: Emergency Medicine

## 2015-02-04 ENCOUNTER — Emergency Department (HOSPITAL_COMMUNITY)
Admission: EM | Admit: 2015-02-04 | Discharge: 2015-02-04 | Disposition: A | Discharge status: Home / Self Care | Payer: 59 | Attending: Emergency Medicine | Admitting: Emergency Medicine

## 2015-02-04 ENCOUNTER — Emergency Department (HOSPITAL_COMMUNITY): Payer: 59

## 2015-02-04 DIAGNOSIS — F329 Major depressive disorder, single episode, unspecified: Secondary | ICD-10-CM | POA: Diagnosis not present

## 2015-02-04 DIAGNOSIS — Z8719 Personal history of other diseases of the digestive system: Secondary | ICD-10-CM | POA: Insufficient documentation

## 2015-02-04 DIAGNOSIS — F419 Anxiety disorder, unspecified: Secondary | ICD-10-CM | POA: Insufficient documentation

## 2015-02-04 DIAGNOSIS — E785 Hyperlipidemia, unspecified: Secondary | ICD-10-CM | POA: Diagnosis not present

## 2015-02-04 DIAGNOSIS — Z87891 Personal history of nicotine dependence: Secondary | ICD-10-CM | POA: Diagnosis not present

## 2015-02-04 DIAGNOSIS — Z8673 Personal history of transient ischemic attack (TIA), and cerebral infarction without residual deficits: Secondary | ICD-10-CM | POA: Diagnosis not present

## 2015-02-04 DIAGNOSIS — Z880 Allergy status to penicillin: Secondary | ICD-10-CM | POA: Insufficient documentation

## 2015-02-04 DIAGNOSIS — Y929 Unspecified place or not applicable: Secondary | ICD-10-CM | POA: Diagnosis not present

## 2015-02-04 DIAGNOSIS — Z79899 Other long term (current) drug therapy: Secondary | ICD-10-CM | POA: Diagnosis not present

## 2015-02-04 DIAGNOSIS — X58XXXA Exposure to other specified factors, initial encounter: Secondary | ICD-10-CM | POA: Insufficient documentation

## 2015-02-04 DIAGNOSIS — Y939 Activity, unspecified: Secondary | ICD-10-CM | POA: Insufficient documentation

## 2015-02-04 DIAGNOSIS — R52 Pain, unspecified: Secondary | ICD-10-CM

## 2015-02-04 DIAGNOSIS — Y999 Unspecified external cause status: Secondary | ICD-10-CM | POA: Diagnosis not present

## 2015-02-04 DIAGNOSIS — G8929 Other chronic pain: Secondary | ICD-10-CM | POA: Diagnosis not present

## 2015-02-04 DIAGNOSIS — F1310 Sedative, hypnotic or anxiolytic abuse, uncomplicated: Secondary | ICD-10-CM | POA: Diagnosis not present

## 2015-02-04 DIAGNOSIS — S42291A Other displaced fracture of upper end of right humerus, initial encounter for closed fracture: Secondary | ICD-10-CM | POA: Insufficient documentation

## 2015-02-04 DIAGNOSIS — Z7982 Long term (current) use of aspirin: Secondary | ICD-10-CM | POA: Insufficient documentation

## 2015-02-04 DIAGNOSIS — S42301A Unspecified fracture of shaft of humerus, right arm, initial encounter for closed fracture: Secondary | ICD-10-CM

## 2015-02-04 DIAGNOSIS — S4991XA Unspecified injury of right shoulder and upper arm, initial encounter: Secondary | ICD-10-CM | POA: Diagnosis present

## 2015-02-04 LAB — URINALYSIS, ROUTINE W REFLEX MICROSCOPIC
Bilirubin Urine: NEGATIVE
Glucose, UA: NEGATIVE mg/dL
HGB URINE DIPSTICK: NEGATIVE
Ketones, ur: NEGATIVE mg/dL
NITRITE: NEGATIVE
Protein, ur: NEGATIVE mg/dL
Specific Gravity, Urine: 1.013 (ref 1.005–1.030)
Urobilinogen, UA: 0.2 mg/dL (ref 0.0–1.0)
pH: 7 (ref 5.0–8.0)

## 2015-02-04 LAB — CBC
HCT: 34.2 % — ABNORMAL LOW (ref 36.0–46.0)
HEMOGLOBIN: 10.9 g/dL — AB (ref 12.0–15.0)
MCH: 28.3 pg (ref 26.0–34.0)
MCHC: 31.9 g/dL (ref 30.0–36.0)
MCV: 88.8 fL (ref 78.0–100.0)
PLATELETS: 216 10*3/uL (ref 150–400)
RBC: 3.85 MIL/uL — ABNORMAL LOW (ref 3.87–5.11)
RDW: 14.3 % (ref 11.5–15.5)
WBC: 9.1 10*3/uL (ref 4.0–10.5)

## 2015-02-04 LAB — BASIC METABOLIC PANEL
Anion gap: 8 (ref 5–15)
BUN: 10 mg/dL (ref 6–20)
CO2: 25 mmol/L (ref 22–32)
Calcium: 8.8 mg/dL — ABNORMAL LOW (ref 8.9–10.3)
Chloride: 105 mmol/L (ref 101–111)
Creatinine, Ser: 0.68 mg/dL (ref 0.44–1.00)
Glucose, Bld: 160 mg/dL — ABNORMAL HIGH (ref 65–99)
POTASSIUM: 3.7 mmol/L (ref 3.5–5.1)
Sodium: 138 mmol/L (ref 135–145)

## 2015-02-04 LAB — RAPID URINE DRUG SCREEN, HOSP PERFORMED
AMPHETAMINES: NOT DETECTED
Barbiturates: NOT DETECTED
Benzodiazepines: POSITIVE — AB
COCAINE: NOT DETECTED
Opiates: POSITIVE — AB
TETRAHYDROCANNABINOL: NOT DETECTED

## 2015-02-04 LAB — URINE MICROSCOPIC-ADD ON

## 2015-02-04 LAB — CBG MONITORING, ED: Glucose-Capillary: 145 mg/dL — ABNORMAL HIGH (ref 65–99)

## 2015-02-04 MED ORDER — ONDANSETRON HCL 4 MG/2ML IJ SOLN
4.0000 mg | Freq: Once | INTRAMUSCULAR | Status: AC
  Administered 2015-02-04: 4 mg via INTRAVENOUS
  Filled 2015-02-04: qty 2

## 2015-02-04 MED ORDER — MORPHINE SULFATE 4 MG/ML IJ SOLN
4.0000 mg | Freq: Once | INTRAMUSCULAR | Status: AC
  Administered 2015-02-04: 4 mg via INTRAVENOUS
  Filled 2015-02-04: qty 1

## 2015-02-04 MED ORDER — HYDROMORPHONE HCL 1 MG/ML IJ SOLN
1.0000 mg | Freq: Once | INTRAMUSCULAR | Status: AC
Start: 2015-02-04 — End: 2015-02-04
  Administered 2015-02-04: 1 mg via INTRAVENOUS
  Filled 2015-02-04: qty 1

## 2015-02-04 MED ORDER — SODIUM CHLORIDE 0.9 % IV BOLUS (SEPSIS)
1000.0000 mL | Freq: Once | INTRAVENOUS | Status: AC
  Administered 2015-02-04: 1000 mL via INTRAVENOUS

## 2015-02-04 MED ORDER — HYDROMORPHONE HCL 1 MG/ML IJ SOLN
1.0000 mg | Freq: Once | INTRAMUSCULAR | Status: AC
  Administered 2015-02-04: 1 mg via INTRAVENOUS
  Filled 2015-02-04: qty 1

## 2015-02-04 NOTE — ED Provider Notes (Signed)
CSN: [id number]     Arrival date & time 02/04/15  1450 History   First MD Initiated Contact with Patient 02/04/15 1511     Chief Complaint  Patient presents with  . Weakness  . Memory Loss  . Arm Pain     (Consider location/radiation/quality/duration/timing/severity/associated sxs/prior Treatment) HPI Comments: Patient presents today with a chief complaint of right upper arm pain and intermittent confusion.  Her friend that lives with her reports that yesterday morning the patient appeared to be confused.  He states that she was "spaced out."  He also reports that she had an episode of urinary incontinence yesterday.  He reports that he left the house yesterday afternoon and when he returned last evening she appeared to be back to baseline mentally.  He states that she appears to be at her baseline at this time.  Patient is currently complaining of pain to her right shoulder.  She is unsure if she fell, but assumes that she did because she has a large bruise to the arm.  She denies numbness, tingling, chest pain, SOB, nausea, vomiting, abdominal pain, headache, vision changes, focal weakness, fever, chills, nuchal rigidity, or urinary symptoms.  She is currently on MS Contin and Oxycodone for chronic pain.  She is also on Trazadone 200 mg at bedtime.  She is also on Prozac and Klonipin.  She reports taking the medications as prescribed.  Denies any recent changes in medication dosages.  She denies any recent alcohol use.  Denies any recreational drug use.  Patient reports that she has had similar confusion in the past, which was thought to be related to medications.  The history is provided by the patient.    Past Medical History  Diagnosis Date  . Arrhythmia   . Diverticulitis   . Depression   . Headache(784.0)   . Anxiety   . HLD (hyperlipidemia)   . Chronic back pain   . Stroke    Past Surgical History  Procedure Laterality Date  . Tonsillectomy    . Abdominal hysterectomy    .  Breast enhancement surgery    . Rhinoplasty    . Oophorectomy Bilateral   . Bunionectomy Bilateral    Family History  Problem Relation Age of Onset  . Lung cancer Mother   . Heart attack Father   . Hypertension Brother   . Esophageal cancer Brother   . Colon cancer Neg Hx   . Diabetes Paternal Grandmother     questionable  . Kidney disease Neg Hx   . Liver disease Neg Hx    History  Substance Use Topics  . Smoking status: Former Smoker    Types: Cigarettes    Quit date: 06/30/2011  . Smokeless tobacco: Never Used  . Alcohol Use: No   OB History    No data available     Review of Systems  All other systems reviewed and are negative.     Allergies  Penicillins  Home Medications   Prior to Admission medications   Medication Sig Start Date End Date Taking? Authorizing Provider  aspirin EC 81 MG tablet Take 81 mg by mouth daily.    Historical Provider, MD  atorvastatin (LIPITOR) 20 MG tablet TAKE 1 TABLET BY MOUTH ONCE DAILY AT 6PM Patient not taking: Reported on 12/19/2014 05/07/14   Marcial Pacas, MD  buPROPion (WELLBUTRIN XL) 150 MG 24 hr tablet Take 150 mg by mouth daily. Taken with 300mg  tablet to equal 450mg  daily.    Historical Provider,  MD  buPROPion (WELLBUTRIN XL) 300 MG 24 hr tablet Take 300 mg by mouth daily. Taken with 150mg  tablet to equal 450mg  daily.    Historical Provider, MD  calcium-vitamin D (OSCAL WITH D) 500-200 MG-UNIT per tablet Take 1 tablet by mouth daily with breakfast.    Historical Provider, MD  carisoprodol (SOMA) 350 MG tablet Take 1 tablet by mouth daily as needed for muscle spasms.  11/20/14   Historical Provider, MD  clonazePAM (KLONOPIN) 1 MG tablet Take 1 mg by mouth at bedtime as needed (anxiety/sleep.).  12/06/14   Historical Provider, MD  fexofenadine (ALLEGRA) 180 MG tablet Take 180 mg by mouth at bedtime.    Historical Provider, MD  FLUoxetine (PROZAC) 20 MG capsule Take 60 mg by mouth daily.     Historical Provider, MD  fluticasone  (FLONASE) 50 MCG/ACT nasal spray Place 2 sprays into both nostrils daily as needed for allergies.  10/17/13   Historical Provider, MD  ibuprofen (ADVIL,MOTRIN) 400 MG tablet Take 1 tablet (400 mg total) by mouth every 6 (six) hours as needed. 12/19/14   Lacretia Leigh, MD  ketorolac (TORADOL) 15 MG/ML injection Inject 15 mg into the vein once.    Historical Provider, MD  montelukast (SINGULAIR) 10 MG tablet Take 10 mg by mouth daily.     Historical Provider, MD  morphine (MS CONTIN) 30 MG 12 hr tablet Take 30 mg by mouth every 12 (twelve) hours. 12/01/14   Historical Provider, MD  oxyCODONE-acetaminophen (PERCOCET) 10-325 MG per tablet Take 1 tablet by mouth 4 (four) times daily as needed for pain.  12/12/14   Historical Provider, MD  oxyCODONE-acetaminophen (PERCOCET/ROXICET) 5-325 MG per tablet Take 2 tablets by mouth every 4 (four) hours as needed for severe pain. Patient not taking: Reported on 12/19/2014 08/26/14   Fransico Meadow, PA-C  oxymorphone (OPANA) 10 MG tablet Take 10 mg by mouth every 12 (twelve) hours as needed for pain.  10/23/14   Historical Provider, MD  promethazine (PHENERGAN) 25 MG/ML injection Inject 25 mg into the vein once.    Historical Provider, MD  SUMAtriptan (IMITREX) 25 MG tablet Take 2 tablets (50 mg total) by mouth once. May take 50mg  at onset of migraine and repeat once after 2 hours. Maximum daily dose 200mg  03/31/14   Noland Fordyce, PA-C  topiramate (TOPAMAX) 25 MG capsule Take 2 capsules (50 mg total) by mouth 2 (two) times daily. Patient not taking: Reported on 12/19/2014 04/14/14   Dennie Bible, NP  traZODone (DESYREL) 100 MG tablet Take 200 mg by mouth at bedtime.     Historical Provider, MD   BP 94/73 mmHg  Pulse 82  Temp(Src) 98.3 F (36.8 C) (Oral)  Resp 18  SpO2 98% Physical Exam  Constitutional: She is oriented to person, place, and time. She appears well-developed and well-nourished.  HENT:  Head: Normocephalic and atraumatic.  Mouth/Throat:  Oropharynx is clear and moist.  Eyes: EOM are normal. Pupils are equal, round, and reactive to light.  Neck: Normal range of motion. Neck supple.  Cardiovascular: Normal rate, regular rhythm and normal heart sounds.   Pulmonary/Chest: Effort normal and breath sounds normal.  Abdominal: Soft. Bowel sounds are normal. She exhibits no distension and no mass. There is no tenderness. There is no rebound and no guarding.  Musculoskeletal:       Right shoulder: She exhibits normal range of motion, no tenderness, no bony tenderness, no swelling and no deformity.       Right elbow:  Tenderness found. Olecranon process tenderness noted.       Right wrist: She exhibits normal range of motion, no tenderness, no bony tenderness, no swelling and no effusion.       Cervical back: She exhibits normal range of motion, no tenderness, no bony tenderness, no swelling, no edema and no deformity.       Thoracic back: She exhibits tenderness and bony tenderness. She exhibits normal range of motion, no swelling, no edema and no deformity.       Lumbar back: She exhibits normal range of motion, no tenderness, no bony tenderness, no swelling, no edema and no deformity.  Large area of ecchymosis of the right upper arm Full ROM of the lower extremities bilaterally Tenderness to palpation of the right upper ribs  Neurological: She is alert and oriented to person, place, and time. She has normal strength. No cranial nerve deficit or sensory deficit. Coordination normal.  Skin: Skin is warm and dry.  Psychiatric: She has a normal mood and affect.  Nursing note and vitals reviewed.   ED Course  Procedures (including critical care time) Labs Review Labs Reviewed  CBC  BASIC METABOLIC PANEL  CBG MONITORING, ED    Imaging Review Dg Ribs Unilateral W/chest Right  02/04/2015   CLINICAL DATA:  [place] today.  Right-sided chest pain.  EXAM: RIGHT RIBS AND CHEST - 3+ VIEW  COMPARISON:  Chest CT 12/19/2014  FINDINGS: The  cardiac silhouette, mediastinal and hilar contours are within normal limits and stable. No acute pulmonary findings.  Dedicated views of the right ribs do not demonstrate any definite acute right-sided rib fractures.  IMPRESSION: No acute cardiopulmonary findings and no definite rib fractures.  Right Humeral head and neck fractures noted.   Electronically Signed   By: Marijo Sanes M.D.   On: 02/04/2015 16:47   Dg Thoracic Spine 2 View  02/04/2015   CLINICAL DATA:  Fall.  Pain in chest and shoulder.  EXAM: THORACIC SPINE - 2-3 VIEWS  COMPARISON:  12/19/2014  FINDINGS: Thoracic spondylosis.  Mild levoconvex thoracic scoliosis.  Poor visibility of the thoracic spine on the lateral projections, much of which is due to arm positioning and underpenetration. The upper half of the thoracic spine is especially obscured, with very poor definition of the thoracic vertebral cortex. The cervicothoracic junction is not visualized.  IMPRESSION: 1. Reduce sensitivity to to underpenetration, much of which is due to difficulty with positioning of the patient's right arm (the patient has a displaced right proximal humeral fracture). No obvious thoracic spine fracture identified but the lateral projections are limited.   Electronically Signed   By: Van Clines M.D.   On: 02/04/2015 16:49   Dg Shoulder Right  02/04/2015   CLINICAL DATA:  Fall.  Pain in the right shoulder.  EXAM: RIGHT SHOULDER - 2+ VIEW  COMPARISON:  None.  FINDINGS: Proximal humeral metaphyseal fracture noted with varus angulation in several small marginal fragments. Questionable linear fracture involving the greater tuberosity. The dominant fracture plane appears displaced over 1 cm.  Thoracic spondylosis.  IMPRESSION: 1. Right humeral surgical neck fracture is displaced by greater than 1 cm. Several small fragments are observed. Questionable linear fracture extension into the greater tuberosity.   Electronically Signed   By: Van Clines M.D.    On: 02/04/2015 15:33   Dg Elbow 2 Views Right  02/04/2015   CLINICAL DATA:  Acute right elbow pain after fall today. Initial encounter.  EXAM: RIGHT ELBOW - 2 VIEW  COMPARISON:  None.  FINDINGS: There is no evidence of fracture, dislocation, or joint effusion. There is no evidence of arthropathy or other focal bone abnormality. Soft tissues are unremarkable.  IMPRESSION: Normal right elbow.   Electronically Signed   By: Marijo Conception, M.D.   On: 02/04/2015 16:49   Ct Head Wo Contrast  02/04/2015   CLINICAL DATA:  Fall, memory loss  EXAM: CT HEAD WITHOUT CONTRAST  TECHNIQUE: Contiguous axial images were obtained from the base of the skull through the vertex without intravenous contrast.  COMPARISON:  MRI brain dated 09/28/2014  FINDINGS: No evidence of parenchymal hemorrhage or extra-axial fluid collection. No mass lesion, mass effect, or midline shift.  No CT evidence of acute infarction.  Mild subcortical white matter and periventricular small vessel ischemic changes.  Cerebral volume is within normal limits.  No ventriculomegaly.  The visualized paranasal sinuses are essentially clear. The mastoid air cells are unopacified.  No evidence of calvarial fracture.  IMPRESSION: No evidence of acute intracranial abnormality.  Mild small vessel ischemic changes.   Electronically Signed   By: Julian Hy M.D.   On: 02/04/2015 17:04   Dg Humerus Right  02/04/2015   CLINICAL DATA:  [place] today.  Right shoulder pain.  EXAM: RIGHT HUMERUS - 2+ VIEW  COMPARISON:  None.  FINDINGS: Displaced humeral head and neck fractures are again demonstrated. No humeral shaft fracture. The elbow joint is intact.  IMPRESSION: Humeral head and neck fractures but no humeral shaft fracture.   Electronically Signed   By: Marijo Sanes M.D.   On: 02/04/2015 17:49     EKG Interpretation   Date/Time:  Sunday February 04 2015 15:00:01 EDT Ventricular Rate:  81 PR Interval:  161 QRS Duration: 80 QT Interval:  478 QTC Calculation:  555 R Axis:   74 Text Interpretation:  Sinus rhythm Borderline repolarization abnormality  Prolonged QT interval Baseline wander in lead(s) II III aVF V2 ED  PHYSICIAN INTERPRETATION AVAILABLE IN [facility] Confirmed by TEST,  Record ([id number]) on 02/05/2015 7:04:23 AM      MDM   Final diagnoses:  None   Patient presents today with a complaint of right shoulder pain.  Her friend that lives with her also reports that she had a period of confusion yesterday.  Today right shoulder xray revealing a surgical neck fracture.  Image reviewed by Dr. Aline Brochure.  Fracture is closed.  Patient neurovascularly intact.  Arm placed in a sling immobilizer and patient given referral to Orthopedics.  Patient is not confused in the ED today.  She has a normal neurological exam.  Patient is afebrile.  Labs are unremarkable.  Head CT is negative.  Suspect confusion was a result of polypharmacy.  Patient instructed to follow up with her PCP regarding current medication regimen.  Feel that the patient is stable for discharge.  Patient ambulating without difficulty.  Her friend reports that he will be at the home with her all day tomorrow.  Both patient and friend are requesting to be discharge.  Patient also discussed with Dr. Aline Brochure who is in agreement with the plan.  Return precautions given.    Hyman Bible, PA-C 02/06/15 3557  Pamella Pert, MD 02/09/15 [phone]

## 2015-02-04 NOTE — ED Notes (Signed)
Informed the pt that a urine specimen is needed. Pt states that she does not have to urinate at this time.

## 2015-02-04 NOTE — Discharge Instructions (Signed)
Follow up with your Primary Care Physician regarding your current medications.  You may need some adjustments made to your current medications.

## 2015-02-04 NOTE — ED Notes (Signed)
Patients ride will need to be called at discharge Algis Liming @ [phone]

## 2015-02-04 NOTE — ED Provider Notes (Signed)
ED ECG REPORT   Date: 02/04/2015  Rate: 81  Rhythm: normal sinus rhythm  QRS Axis: normal  Intervals: QT prolonged  ST/T Wave abnormalities: nonspecific T wave changes and ST depressions laterally  Conduction Disutrbances:none  Narrative Interpretation:   Old EKG Reviewed: unchanged  I have personally reviewed the EKG tracing and agree with the computerized printout as noted.   Ernestina Patches, MD 02/04/15 1504

## 2015-02-04 NOTE — ED Notes (Signed)
Pt transported to Xray. 

## 2015-02-04 NOTE — ED Notes (Signed)
Pt ambulated well in the hall without assistive device or staff assistance---- gait and balance steady.

## 2015-02-04 NOTE — ED Notes (Signed)
Pt with Hx of CVAs c/o right arm pain, weakness, memory loss, states she does not remember yesterday, states this has never happened to her, believes she fell because her right shoulder hurts. Pt's family member states that yesterday morning pt was acting strangely, when he attempted to speak to her, pt stared at him but did not communicate, laid on floor and had urinary incontinence, pt's family member states he left house at 35 yesterday and when he returned last night she was lucid close to baseline, and is at mental baseline of A & O x 4 now. Family member states that pt has had periods over last month of similar mental behavior but not as bad. Family member states concern that pt's muscle relaxer and opiate may be at fault because now that pt has lost so much weight, muscle relaxer may act more strongly. Pt states she lost 20 pounds in 6 months.

## 2015-02-08 NOTE — ED Notes (Signed)
Pt called upset, due to not being sent home w/ pain medication x 4 days ago and reports that she has an appointment w/ Antionette Char tomorrow.  This Agricultural consultant informed her that we sometimes do not prescribe additional pain medication to Pts that have multiple pre-existing pain medications.  Pt reported that she has run out of her previous prescriptions.  She was then informed that she would have to been seen again to receive a prescription.  Pt responded "oh, so it's just too f*ing bad."  This Agricultural consultant apologized and wished her the best.

## 2015-02-13 NOTE — H&P (Signed)
Dorothy White is an 64 y.o. female.    Chief Complaint: right shoulder pain  HPI: Pt is a 64 y.o. female complaining of right shoulder pain since a ground level fall 2 weeks ago. Pain had continually increased since the beginning. X-rays in the clinic show right proximal humerus fracture with displacement. Pt has tried various conservative treatments which have failed to alleviate their symptoms, including injections and therapy. Various options are discussed with the patient. Risks, benefits and expectations were discussed with the patient. Patient understand the risks, benefits and expectations and wishes to proceed with surgery.   PCP:  Jonathon Bellows, MD  D/C Plans: Home  PMH: Past Medical History  Diagnosis Date  . Arrhythmia   . Diverticulitis   . Depression   . Headache(784.0)   . Anxiety   . HLD (hyperlipidemia)   . Chronic back pain   . Stroke     PSH: Past Surgical History  Procedure Laterality Date  . Tonsillectomy    . Abdominal hysterectomy    . Breast enhancement surgery    . Rhinoplasty    . Oophorectomy Bilateral   . Bunionectomy Bilateral     Social History:  reports that she quit smoking about 3 years ago. Her smoking use included Cigarettes. She has never used smokeless tobacco. She reports that she does not drink alcohol or use illicit drugs.  Allergies:  Allergies  Allergen Reactions  . Penicillins Hives    Medications: No current facility-administered medications for this encounter.   Current Outpatient Prescriptions  Medication Sig Dispense Refill  . atorvastatin (LIPITOR) 20 MG tablet TAKE 1 TABLET BY MOUTH ONCE DAILY AT 6PM (Patient not taking: Reported on 12/19/2014) 90 tablet 3  . buPROPion (WELLBUTRIN XL) 150 MG 24 hr tablet Take 150 mg by mouth daily. Taken with 300mg  tablet to equal 450mg  daily.    Marland Kitchen buPROPion (WELLBUTRIN XL) 300 MG 24 hr tablet Take 300 mg by mouth daily. Taken with 150mg  tablet to equal 450mg  daily.    .  calcium-vitamin D (OSCAL WITH D) 500-200 MG-UNIT per tablet Take 1 tablet by mouth daily with breakfast.    . carisoprodol (SOMA) 350 MG tablet Take 1 tablet by mouth daily as needed for muscle spasms.   0  . clonazePAM (KLONOPIN) 1 MG tablet Take 1 mg by mouth at bedtime as needed (anxiety/sleep.).   3  . fexofenadine (ALLEGRA) 180 MG tablet Take 180 mg by mouth at bedtime.    Marland Kitchen FLUoxetine (PROZAC) 20 MG capsule Take 60 mg by mouth daily.     . fluticasone (FLONASE) 50 MCG/ACT nasal spray Place 2 sprays into both nostrils daily as needed for allergies.     Marland Kitchen ibuprofen (ADVIL,MOTRIN) 400 MG tablet Take 1 tablet (400 mg total) by mouth every 6 (six) hours as needed. 30 tablet 0  . montelukast (SINGULAIR) 10 MG tablet Take 10 mg by mouth daily.     Marland Kitchen morphine (MS CONTIN) 30 MG 12 hr tablet Take 30 mg by mouth every 12 (twelve) hours.  0  . oxyCODONE-acetaminophen (PERCOCET) 10-325 MG per tablet Take 1 tablet by mouth 4 (four) times daily as needed for pain.   0  . oxyCODONE-acetaminophen (PERCOCET/ROXICET) 5-325 MG per tablet Take 2 tablets by mouth every 4 (four) hours as needed for severe pain. (Patient not taking: Reported on 12/19/2014) 15 tablet 0  . SUMAtriptan (IMITREX) 25 MG tablet Take 2 tablets (50 mg total) by mouth once. May take 50mg  at  onset of migraine and repeat once after 2 hours. Maximum daily dose 200mg  30 tablet 0  . topiramate (TOPAMAX) 25 MG capsule Take 2 capsules (50 mg total) by mouth 2 (two) times daily. (Patient not taking: Reported on 12/19/2014) 120 capsule 6  . traZODone (DESYREL) 100 MG tablet Take 200 mg by mouth at bedtime.       No results found for this or any previous visit (from the past 48 hour(s)). No results found.  ROS: Pain with rom of the right upper extremity  Physical Exam:  Alert and oriented 64 y.o. female in no acute distress Cranial nerves 2-12 intact Cervical spine: full rom with no tenderness, nv intact distally Chest: active breath sounds  bilaterally, no wheeze rhonchi or rales Heart: regular rate and rhythm, no murmur Abd: non tender non distended with active bowel sounds Hip is stable with rom  Right shoulder with mild edema and ecchymosis Pt has limited rom of right shoulder nv intact distally No rashes  Currently in a sling  Assessment/Plan Assessment: right proximal humerus fracture  Plan: Patient will undergo a right proximal humerus ORIF by Dr. Veverly Fells at [facility]. Risks benefits and expectations were discussed with the patient. Patient understand risks, benefits and expectations and wishes to proceed.

## 2015-02-14 ENCOUNTER — Encounter (HOSPITAL_COMMUNITY): Payer: Self-pay | Admitting: *Deleted

## 2015-02-14 MED ORDER — CLINDAMYCIN PHOSPHATE 900 MG/50ML IV SOLN
900.0000 mg | INTRAVENOUS | Status: AC
Start: 2015-02-15 — End: 2015-02-15
  Administered 2015-02-15: 900 mg via INTRAVENOUS
  Filled 2015-02-14: qty 50

## 2015-02-15 ENCOUNTER — Ambulatory Visit (HOSPITAL_COMMUNITY): Payer: 59 | Admitting: Certified Registered Nurse Anesthetist

## 2015-02-15 ENCOUNTER — Encounter (HOSPITAL_COMMUNITY)
Admission: RE | Disposition: A | Discharge status: Home / Self Care | Payer: Self-pay | Source: Ambulatory Visit | Attending: Orthopedic Surgery

## 2015-02-15 ENCOUNTER — Encounter (HOSPITAL_COMMUNITY): Payer: Self-pay | Admitting: Certified Registered Nurse Anesthetist

## 2015-02-15 ENCOUNTER — Inpatient Hospital Stay (HOSPITAL_COMMUNITY): Payer: 59

## 2015-02-15 ENCOUNTER — Inpatient Hospital Stay (HOSPITAL_COMMUNITY)
Admission: RE | Admit: 2015-02-15 | Discharge: 2015-02-17 | DRG: 494 | Disposition: A | Discharge status: Home / Self Care | Payer: 59 | Source: Ambulatory Visit | Attending: Orthopedic Surgery | Admitting: Orthopedic Surgery

## 2015-02-15 DIAGNOSIS — G8929 Other chronic pain: Secondary | ICD-10-CM | POA: Diagnosis present

## 2015-02-15 DIAGNOSIS — F329 Major depressive disorder, single episode, unspecified: Secondary | ICD-10-CM | POA: Diagnosis present

## 2015-02-15 DIAGNOSIS — Z87891 Personal history of nicotine dependence: Secondary | ICD-10-CM | POA: Diagnosis not present

## 2015-02-15 DIAGNOSIS — S42211A Unspecified displaced fracture of surgical neck of right humerus, initial encounter for closed fracture: Secondary | ICD-10-CM | POA: Diagnosis present

## 2015-02-15 DIAGNOSIS — S4291XA Fracture of right shoulder girdle, part unspecified, initial encounter for closed fracture: Secondary | ICD-10-CM

## 2015-02-15 DIAGNOSIS — E785 Hyperlipidemia, unspecified: Secondary | ICD-10-CM | POA: Diagnosis present

## 2015-02-15 DIAGNOSIS — Z79891 Long term (current) use of opiate analgesic: Secondary | ICD-10-CM | POA: Diagnosis not present

## 2015-02-15 DIAGNOSIS — Z8673 Personal history of transient ischemic attack (TIA), and cerebral infarction without residual deficits: Secondary | ICD-10-CM

## 2015-02-15 DIAGNOSIS — W1830XA Fall on same level, unspecified, initial encounter: Secondary | ICD-10-CM

## 2015-02-15 DIAGNOSIS — K219 Gastro-esophageal reflux disease without esophagitis: Secondary | ICD-10-CM | POA: Diagnosis present

## 2015-02-15 DIAGNOSIS — M25511 Pain in right shoulder: Secondary | ICD-10-CM | POA: Diagnosis present

## 2015-02-15 DIAGNOSIS — Z85828 Personal history of other malignant neoplasm of skin: Secondary | ICD-10-CM

## 2015-02-15 DIAGNOSIS — S4290XA Fracture of unspecified shoulder girdle, part unspecified, initial encounter for closed fracture: Secondary | ICD-10-CM | POA: Diagnosis present

## 2015-02-15 DIAGNOSIS — M549 Dorsalgia, unspecified: Secondary | ICD-10-CM | POA: Diagnosis present

## 2015-02-15 DIAGNOSIS — F419 Anxiety disorder, unspecified: Secondary | ICD-10-CM | POA: Diagnosis present

## 2015-02-15 DIAGNOSIS — Z880 Allergy status to penicillin: Secondary | ICD-10-CM | POA: Diagnosis not present

## 2015-02-15 DIAGNOSIS — Z79899 Other long term (current) drug therapy: Secondary | ICD-10-CM | POA: Diagnosis not present

## 2015-02-15 DIAGNOSIS — K5900 Constipation, unspecified: Secondary | ICD-10-CM | POA: Diagnosis present

## 2015-02-15 HISTORY — DX: Constipation, unspecified: K59.00

## 2015-02-15 HISTORY — DX: Gastro-esophageal reflux disease without esophagitis: K21.9

## 2015-02-15 HISTORY — DX: Unspecified osteoarthritis, unspecified site: M19.90

## 2015-02-15 HISTORY — DX: Anemia, unspecified: D64.9

## 2015-02-15 HISTORY — PX: ORIF HUMERUS FRACTURE: SHX2126

## 2015-02-15 LAB — BASIC METABOLIC PANEL
Anion gap: 7 (ref 5–15)
BUN: 10 mg/dL (ref 6–20)
CO2: 27 mmol/L (ref 22–32)
Calcium: 9.2 mg/dL (ref 8.9–10.3)
Chloride: 105 mmol/L (ref 101–111)
Creatinine, Ser: 0.58 mg/dL (ref 0.44–1.00)
GFR calc Af Amer: >60 mL/min (ref >60)
GFR calc non Af Amer: >60 mL/min (ref >60)
Glucose, Bld: 103 mg/dL — ABNORMAL HIGH (ref 65–99)
POTASSIUM: 4.1 mmol/L (ref 3.5–5.1)
Sodium: 139 mmol/L (ref 135–145)

## 2015-02-15 LAB — CBC
HEMATOCRIT: 35 % — AB (ref 36.0–46.0)
HEMOGLOBIN: 10.9 g/dL — AB (ref 12.0–15.0)
MCH: 28.2 pg (ref 26.0–34.0)
MCHC: 31.1 g/dL (ref 30.0–36.0)
MCV: 90.4 fL (ref 78.0–100.0)
PLATELETS: 386 10*3/uL (ref 150–400)
RBC: 3.87 MIL/uL (ref 3.87–5.11)
RDW: 14.3 % (ref 11.5–15.5)
WBC: 6.7 10*3/uL (ref 4.0–10.5)

## 2015-02-15 SURGERY — OPEN REDUCTION INTERNAL FIXATION (ORIF) PROXIMAL HUMERUS FRACTURE
Anesthesia: Regional | Site: Shoulder | Laterality: Right

## 2015-02-15 MED ORDER — BUPROPION HCL ER (XL) 150 MG PO TB24
450.0000 mg | ORAL_TABLET | Freq: Every day | ORAL | Status: DC
  Filled 2015-02-15 (×2): qty 3

## 2015-02-15 MED ORDER — CHLORHEXIDINE GLUCONATE 4 % EX LIQD: 60.0000 mL | Freq: Once | CUTANEOUS | Status: DC

## 2015-02-15 MED ORDER — SUMATRIPTAN SUCCINATE 50 MG PO TABS
50.0000 mg | ORAL_TABLET | Freq: Once | ORAL | Status: DC
  Filled 2015-02-15: qty 1

## 2015-02-15 MED ORDER — EPHEDRINE SULFATE 50 MG/ML IJ SOLN
INTRAMUSCULAR | Status: AC
  Filled 2015-02-15: qty 2

## 2015-02-15 MED ORDER — SODIUM CHLORIDE 0.9 % IV SOLN: INTRAVENOUS | Status: DC

## 2015-02-15 MED ORDER — HYDROMORPHONE HCL 1 MG/ML IJ SOLN
INTRAMUSCULAR | Status: AC
  Filled 2015-02-15: qty 1

## 2015-02-15 MED ORDER — ROCURONIUM BROMIDE 50 MG/5ML IV SOLN
INTRAVENOUS | Status: AC
  Filled 2015-02-15: qty 1

## 2015-02-15 MED ORDER — BUPIVACAINE-EPINEPHRINE (PF) 0.5% -1:200000 IJ SOLN
INTRAMUSCULAR | Status: DC | PRN
  Administered 2015-02-15: 30 mL via PERINEURAL

## 2015-02-15 MED ORDER — POLYETHYLENE GLYCOL 3350 17 G PO PACK: 17.0000 g | PACK | Freq: Every day | ORAL | Status: DC | PRN

## 2015-02-15 MED ORDER — FLUTICASONE PROPIONATE 50 MCG/ACT NA SUSP
2.0000 | Freq: Every day | NASAL | Status: DC | PRN
  Filled 2015-02-15: qty 16

## 2015-02-15 MED ORDER — BISACODYL 10 MG RE SUPP: 10.0000 mg | Freq: Every day | RECTAL | Status: DC | PRN

## 2015-02-15 MED ORDER — TRAZODONE HCL 100 MG PO TABS
200.0000 mg | ORAL_TABLET | Freq: Every day | ORAL | Status: DC
  Administered 2015-02-15 – 2015-02-16 (×2): 200 mg via ORAL
  Filled 2015-02-15 (×2): qty 2

## 2015-02-15 MED ORDER — DOCUSATE SODIUM 100 MG PO CAPS
100.0000 mg | ORAL_CAPSULE | Freq: Two times a day (BID) | ORAL | Status: DC
  Administered 2015-02-16 – 2015-02-17 (×3): 100 mg via ORAL
  Filled 2015-02-15 (×3): qty 1

## 2015-02-15 MED ORDER — HYDROMORPHONE HCL 1 MG/ML IJ SOLN
0.2500 mg | INTRAMUSCULAR | Status: DC | PRN
  Administered 2015-02-15: 0.25 mg via INTRAVENOUS
  Administered 2015-02-15: 0.5 mg via INTRAVENOUS
  Administered 2015-02-15: 0.25 mg via INTRAVENOUS
  Administered 2015-02-15: 0.5 mg via INTRAVENOUS

## 2015-02-15 MED ORDER — BUPIVACAINE-EPINEPHRINE (PF) 0.25% -1:200000 IJ SOLN
INTRAMUSCULAR | Status: AC
  Filled 2015-02-15: qty 30

## 2015-02-15 MED ORDER — LIDOCAINE HCL (CARDIAC) 20 MG/ML IV SOLN
INTRAVENOUS | Status: AC
  Filled 2015-02-15: qty 20

## 2015-02-15 MED ORDER — OXYCODONE-ACETAMINOPHEN 5-325 MG PO TABS
1.0000 | ORAL_TABLET | Freq: Four times a day (QID) | ORAL | Status: DC | PRN
  Administered 2015-02-15 – 2015-02-16 (×4): 1 via ORAL
  Filled 2015-02-15 (×4): qty 1

## 2015-02-15 MED ORDER — FLUOXETINE HCL 20 MG PO CAPS
60.0000 mg | ORAL_CAPSULE | Freq: Every day | ORAL | Status: DC
Start: 2015-02-16 — End: 2015-02-17
  Administered 2015-02-16 – 2015-02-17 (×2): 60 mg via ORAL
  Filled 2015-02-15 (×2): qty 3

## 2015-02-15 MED ORDER — OXYCODONE-ACETAMINOPHEN 5-325 MG PO TABS: 1.0000 | ORAL_TABLET | ORAL | Status: DC | PRN

## 2015-02-15 MED ORDER — NEOSTIGMINE METHYLSULFATE 10 MG/10ML IV SOLN
INTRAVENOUS | Status: DC | PRN
  Administered 2015-02-15: 3 mg via INTRAVENOUS
  Administered 2015-02-15: 1 mg via INTRAVENOUS

## 2015-02-15 MED ORDER — METHOCARBAMOL 500 MG PO TABS
500.0000 mg | ORAL_TABLET | Freq: Three times a day (TID) | ORAL | Status: DC | PRN
Start: 2015-02-15 — End: 2015-09-25

## 2015-02-15 MED ORDER — OXYCODONE-ACETAMINOPHEN 10-325 MG PO TABS: 1.0000 | ORAL_TABLET | Freq: Four times a day (QID) | ORAL | Status: DC | PRN

## 2015-02-15 MED ORDER — METHOCARBAMOL 500 MG PO TABS
ORAL_TABLET | ORAL | Status: AC
  Filled 2015-02-15: qty 1

## 2015-02-15 MED ORDER — LIDOCAINE HCL (CARDIAC) 20 MG/ML IV SOLN
INTRAVENOUS | Status: DC | PRN
  Administered 2015-02-15: 50 mg via INTRATRACHEAL
  Administered 2015-02-15: 50 mg via INTRAVENOUS

## 2015-02-15 MED ORDER — ACETAMINOPHEN 650 MG RE SUPP: 650.0000 mg | Freq: Four times a day (QID) | RECTAL | Status: DC | PRN

## 2015-02-15 MED ORDER — METHOCARBAMOL 500 MG PO TABS
500.0000 mg | ORAL_TABLET | Freq: Four times a day (QID) | ORAL | Status: DC | PRN
  Administered 2015-02-15 – 2015-02-17 (×3): 500 mg via ORAL
  Filled 2015-02-15 (×2): qty 1

## 2015-02-15 MED ORDER — MONTELUKAST SODIUM 10 MG PO TABS
10.0000 mg | ORAL_TABLET | Freq: Every day | ORAL | Status: DC
  Administered 2015-02-16 – 2015-02-17 (×2): 10 mg via ORAL
  Filled 2015-02-15 (×2): qty 1

## 2015-02-15 MED ORDER — METOCLOPRAMIDE HCL 5 MG PO TABS: 5.0000 mg | ORAL_TABLET | Freq: Three times a day (TID) | ORAL | Status: DC | PRN

## 2015-02-15 MED ORDER — FENTANYL CITRATE (PF) 100 MCG/2ML IJ SOLN
INTRAMUSCULAR | Status: AC
  Administered 2015-02-15: 100 ug
  Filled 2015-02-15: qty 2

## 2015-02-15 MED ORDER — PHENOL 1.4 % MT LIQD: 1.0000 | OROMUCOSAL | Status: DC | PRN

## 2015-02-15 MED ORDER — ONDANSETRON HCL 4 MG/2ML IJ SOLN: 4.0000 mg | Freq: Four times a day (QID) | INTRAMUSCULAR | Status: DC | PRN

## 2015-02-15 MED ORDER — SUCCINYLCHOLINE CHLORIDE 20 MG/ML IJ SOLN
INTRAMUSCULAR | Status: AC
  Filled 2015-02-15: qty 1

## 2015-02-15 MED ORDER — GLYCOPYRROLATE 0.2 MG/ML IJ SOLN
INTRAMUSCULAR | Status: DC | PRN
  Administered 2015-02-15: 0.4 mg via INTRAVENOUS
  Administered 2015-02-15: 0.2 mg via INTRAVENOUS

## 2015-02-15 MED ORDER — HYDROMORPHONE HCL 2 MG PO TABS
2.0000 mg | ORAL_TABLET | ORAL | Status: DC | PRN
  Administered 2015-02-15 – 2015-02-16 (×3): 2 mg via ORAL
  Filled 2015-02-15 (×3): qty 1

## 2015-02-15 MED ORDER — METOCLOPRAMIDE HCL 5 MG/ML IJ SOLN: 5.0000 mg | Freq: Three times a day (TID) | INTRAMUSCULAR | Status: DC | PRN

## 2015-02-15 MED ORDER — CLONAZEPAM 1 MG PO TABS
1.0000 mg | ORAL_TABLET | Freq: Every evening | ORAL | Status: DC | PRN
  Administered 2015-02-15: 1 mg via ORAL
  Filled 2015-02-15 (×2): qty 1

## 2015-02-15 MED ORDER — LORATADINE 10 MG PO TABS
10.0000 mg | ORAL_TABLET | Freq: Every day | ORAL | Status: DC
  Administered 2015-02-16 – 2015-02-17 (×2): 10 mg via ORAL
  Filled 2015-02-15 (×2): qty 1

## 2015-02-15 MED ORDER — ROCURONIUM BROMIDE 100 MG/10ML IV SOLN
INTRAVENOUS | Status: DC | PRN
  Administered 2015-02-15: 35 mg via INTRAVENOUS

## 2015-02-15 MED ORDER — PROPOFOL 10 MG/ML IV BOLUS
INTRAVENOUS | Status: AC
  Filled 2015-02-15: qty 20

## 2015-02-15 MED ORDER — 0.9 % SODIUM CHLORIDE (POUR BTL) OPTIME
TOPICAL | Status: DC | PRN
  Administered 2015-02-15: 1000 mL

## 2015-02-15 MED ORDER — FENTANYL CITRATE (PF) 250 MCG/5ML IJ SOLN
INTRAMUSCULAR | Status: AC
  Filled 2015-02-15: qty 5

## 2015-02-15 MED ORDER — PROMETHAZINE HCL 25 MG/ML IJ SOLN: 6.2500 mg | INTRAMUSCULAR | Status: DC | PRN

## 2015-02-15 MED ORDER — MENTHOL 3 MG MT LOZG: 1.0000 | LOZENGE | OROMUCOSAL | Status: DC | PRN

## 2015-02-15 MED ORDER — ONDANSETRON HCL 4 MG/2ML IJ SOLN
INTRAMUSCULAR | Status: DC | PRN
  Administered 2015-02-15: 4 mg via INTRAVENOUS

## 2015-02-15 MED ORDER — MIDAZOLAM HCL 2 MG/2ML IJ SOLN
INTRAMUSCULAR | Status: AC
  Administered 2015-02-15: 2 mg
  Filled 2015-02-15: qty 2

## 2015-02-15 MED ORDER — PHENYLEPHRINE 40 MCG/ML (10ML) SYRINGE FOR IV PUSH (FOR BLOOD PRESSURE SUPPORT)
PREFILLED_SYRINGE | INTRAVENOUS | Status: AC
  Filled 2015-02-15: qty 20

## 2015-02-15 MED ORDER — ONDANSETRON HCL 4 MG PO TABS: 4.0000 mg | ORAL_TABLET | Freq: Four times a day (QID) | ORAL | Status: DC | PRN

## 2015-02-15 MED ORDER — BUPROPION HCL ER (XL) 150 MG PO TB24: 150.0000 mg | ORAL_TABLET | Freq: Every day | ORAL | Status: DC

## 2015-02-15 MED ORDER — CALCIUM CARBONATE-VITAMIN D 500-200 MG-UNIT PO TABS
1.0000 | ORAL_TABLET | Freq: Every day | ORAL | Status: DC
  Administered 2015-02-16 – 2015-02-17 (×2): 1 via ORAL
  Filled 2015-02-15 (×2): qty 1

## 2015-02-15 MED ORDER — PROPOFOL 10 MG/ML IV BOLUS
INTRAVENOUS | Status: DC | PRN
  Administered 2015-02-15: 120 mg via INTRAVENOUS

## 2015-02-15 MED ORDER — CLINDAMYCIN PHOSPHATE 600 MG/50ML IV SOLN
600.0000 mg | Freq: Four times a day (QID) | INTRAVENOUS | Status: AC
  Administered 2015-02-15 – 2015-02-16 (×3): 600 mg via INTRAVENOUS
  Filled 2015-02-15 (×3): qty 50

## 2015-02-15 MED ORDER — PHENYLEPHRINE HCL 10 MG/ML IJ SOLN
INTRAMUSCULAR | Status: AC
  Filled 2015-02-15: qty 1

## 2015-02-15 MED ORDER — NEOSTIGMINE METHYLSULFATE 10 MG/10ML IV SOLN
INTRAVENOUS | Status: AC
  Filled 2015-02-15: qty 6

## 2015-02-15 MED ORDER — FENTANYL CITRATE (PF) 100 MCG/2ML IJ SOLN
INTRAMUSCULAR | Status: DC | PRN
  Administered 2015-02-15: 50 ug via INTRAVENOUS
  Administered 2015-02-15: 100 ug via INTRAVENOUS

## 2015-02-15 MED ORDER — OXYCODONE HCL 5 MG PO TABS
5.0000 mg | ORAL_TABLET | Freq: Four times a day (QID) | ORAL | Status: DC | PRN
  Administered 2015-02-15 – 2015-02-16 (×4): 5 mg via ORAL
  Filled 2015-02-15 (×4): qty 1

## 2015-02-15 MED ORDER — HYDROMORPHONE HCL 1 MG/ML IJ SOLN
1.0000 mg | INTRAMUSCULAR | Status: DC | PRN
  Administered 2015-02-15 – 2015-02-16 (×8): 1 mg via INTRAVENOUS
  Filled 2015-02-15 (×8): qty 1

## 2015-02-15 MED ORDER — PHENYLEPHRINE HCL 10 MG/ML IJ SOLN
10.0000 mg | INTRAVENOUS | Status: DC | PRN
  Administered 2015-02-15: 25 ug/min via INTRAVENOUS

## 2015-02-15 MED ORDER — ONDANSETRON HCL 4 MG/2ML IJ SOLN
INTRAMUSCULAR | Status: AC
  Filled 2015-02-15: qty 6

## 2015-02-15 MED ORDER — LIDOCAINE HCL (CARDIAC) 20 MG/ML IV SOLN
INTRAVENOUS | Status: AC
  Filled 2015-02-15: qty 5

## 2015-02-15 MED ORDER — EPHEDRINE SULFATE 50 MG/ML IJ SOLN
INTRAMUSCULAR | Status: DC | PRN
  Administered 2015-02-15: 10 mg via INTRAVENOUS

## 2015-02-15 MED ORDER — BUPROPION HCL ER (XL) 150 MG PO TB24: 300.0000 mg | ORAL_TABLET | Freq: Every day | ORAL | Status: DC

## 2015-02-15 MED ORDER — ARTIFICIAL TEARS OP OINT
TOPICAL_OINTMENT | OPHTHALMIC | Status: AC
  Filled 2015-02-15: qty 3.5

## 2015-02-15 MED ORDER — ACETAMINOPHEN 325 MG PO TABS: 650.0000 mg | ORAL_TABLET | Freq: Four times a day (QID) | ORAL | Status: DC | PRN

## 2015-02-15 MED ORDER — LACTATED RINGERS IV SOLN
INTRAVENOUS | Status: DC
  Administered 2015-02-15 – 2015-02-16 (×3): via INTRAVENOUS

## 2015-02-15 SURGICAL SUPPLY — 54 items
BIT DRILL 4 LONG FAST STEP (BIT) ×3 IMPLANT
BIT DRILL 4 SHORT FAST STEP (BIT) ×3 IMPLANT
BIT DRILL SNP 4.0MM (BIT) ×1 IMPLANT
CLOSURE WOUND 1/2 X4 (GAUZE/BANDAGES/DRESSINGS) ×1
COVER SURGICAL LIGHT HANDLE (MISCELLANEOUS) ×3 IMPLANT
DRAPE IMP U-DRAPE 54X76 (DRAPES) ×3 IMPLANT
DRAPE INCISE IOBAN 66X45 STRL (DRAPES) ×6 IMPLANT
DRAPE U-SHAPE 47X51 STRL (DRAPES) ×3 IMPLANT
DRILL BIT SNP 4.0MM (BIT) ×2
DRSG ADAPTIC 3X8 NADH LF (GAUZE/BANDAGES/DRESSINGS) ×3 IMPLANT
DRSG EMULSION OIL 3X3 NADH (GAUZE/BANDAGES/DRESSINGS) IMPLANT
DURAPREP 26ML APPLICATOR (WOUND CARE) ×3 IMPLANT
ELECT REM PT RETURN 9FT ADLT (ELECTROSURGICAL) ×3
ELECTRODE REM PT RTRN 9FT ADLT (ELECTROSURGICAL) ×1 IMPLANT
GAUZE SPONGE 4X4 12PLY STRL (GAUZE/BANDAGES/DRESSINGS) ×3 IMPLANT
GLOVE BIOGEL PI ORTHO PRO 7.5 (GLOVE) ×2
GLOVE BIOGEL PI ORTHO PRO SZ8 (GLOVE) ×2
GLOVE ORTHO TXT STRL SZ7.5 (GLOVE) ×3 IMPLANT
GLOVE PI ORTHO PRO STRL 7.5 (GLOVE) ×1 IMPLANT
GLOVE PI ORTHO PRO STRL SZ8 (GLOVE) ×1 IMPLANT
GLOVE SURG ORTHO 8.5 STRL (GLOVE) ×3 IMPLANT
GOWN STRL REUS W/ TWL XL LVL3 (GOWN DISPOSABLE) ×2 IMPLANT
GOWN STRL REUS W/TWL XL LVL3 (GOWN DISPOSABLE) ×6
K-WIRE 2X5 SS THRDED S3 (WIRE) ×3
KIT BASIN OR (CUSTOM PROCEDURE TRAY) ×3 IMPLANT
KIT ROOM TURNOVER OR (KITS) ×3 IMPLANT
KWIRE 2X5 SS THRDED S3 (WIRE) ×1 IMPLANT
MANIFOLD NEPTUNE II (INSTRUMENTS) ×3 IMPLANT
NEEDLE HYPO 25GX1X1/2 BEV (NEEDLE) IMPLANT
NS IRRIG 1000ML POUR BTL (IV SOLUTION) ×3 IMPLANT
PACK SHOULDER (CUSTOM PROCEDURE TRAY) ×3 IMPLANT
PACK UNIVERSAL I (CUSTOM PROCEDURE TRAY) ×3 IMPLANT
PAD ABD 8X10 STRL (GAUZE/BANDAGES/DRESSINGS) ×3 IMPLANT
PAD ARMBOARD 7.5X6 YLW CONV (MISCELLANEOUS) ×3 IMPLANT
PEG STND 4.0X37.5MM (Orthopedic Implant) ×6 IMPLANT
PEG STND 4.0X45.0MM (Orthopedic Implant) ×6 IMPLANT
PEGSTD 4.0X37.5MM (Orthopedic Implant) ×2 IMPLANT
PEGSTD 4.0X45.0MM (Orthopedic Implant) ×2 IMPLANT
PLATE SZ3 SHOULDER NAIL SNP (Plate) ×3 IMPLANT
SCREW SNP UNICORTICAL (Screw) ×3 IMPLANT
SPONGE LAP 4X18 X RAY DECT (DISPOSABLE) ×3 IMPLANT
STRIP CLOSURE SKIN 1/2X4 (GAUZE/BANDAGES/DRESSINGS) ×2 IMPLANT
SUCTION FRAZIER TIP 10 FR DISP (SUCTIONS) ×3 IMPLANT
SUT FIBERWIRE #2 38 T-5 BLUE (SUTURE)
SUT MNCRL AB 4-0 PS2 18 (SUTURE) ×3 IMPLANT
SUT VIC AB 0 CT1 27 (SUTURE) ×3
SUT VIC AB 0 CT1 27XBRD ANBCTR (SUTURE) ×1 IMPLANT
SUT VIC AB 2-0 CT1 27 (SUTURE) ×4
SUT VIC AB 2-0 CT1 TAPERPNT 27 (SUTURE) ×2 IMPLANT
SUTURE FIBERWR #2 38 T-5 BLUE (SUTURE) IMPLANT
SYR CONTROL 10ML LL (SYRINGE) ×3 IMPLANT
TOWEL OR 17X24 6PK STRL BLUE (TOWEL DISPOSABLE) ×3 IMPLANT
TOWEL OR 17X26 10 PK STRL BLUE (TOWEL DISPOSABLE) ×3 IMPLANT
WATER STERILE IRR 1000ML POUR (IV SOLUTION) IMPLANT

## 2015-02-15 NOTE — Discharge Instructions (Signed)
Ice to the shoulder as much as you can  No load bearing or weight bearing with the right UE  Use the sling while up to support the arm, ok to remove the sling to do exercises and if seated, hug a pillow  Keep the incision clean and dry for one week, then ok to shower and get it wet.  Do exercises as instructed every hour for 5 minutes  Follow up in two weeks with Dr Veverly Fells  [phone]

## 2015-02-15 NOTE — Anesthesia Procedure Notes (Addendum)
Anesthesia Regional Block:  Interscalene brachial plexus block  Pre-Anesthetic Checklist: ,, timeout performed, Correct Patient, Correct Site, Correct Laterality, Correct Procedure, Correct Position, site marked, Risks and benefits discussed,  Surgical consent,  Pre-op evaluation,  At surgeon's request and post-op pain management  Laterality: Right  Prep: chloraprep       Needles:  Injection technique: Single-shot  Needle Type: Echogenic Stimulator Needle     Needle Length: 5cm 5 cm Needle Gauge: 22 and 22 G    Additional Needles:  Procedures: ultrasound guided (picture in chart) and nerve stimulator Interscalene brachial plexus block  Nerve Stimulator or Paresthesia:  Response: bicep contraction, 0.48 mA,   Additional Responses:   Narrative:  Start time: 02/15/2015 10:29 AM End time: 02/15/2015 10:39 AM Injection made incrementally with aspirations every 5 mL.  Performed by: Personally   Additional Notes: Functioning IV was confirmed and monitors applied.  A 11mm 22ga echogenic arrow stimulator was used. Sterile prep and drape,hand hygiene and sterile gloves were used.Ultrasound guidance: relevent anatomy identified, needle position confirmed, local anesthetic spread visualized around nerve(s)., vascular puncture avoided.  Image printed for medical record.  Negative aspiration and negative test dose prior to incremental administration of local anesthetic. The patient tolerated the procedure well.   Procedure Name: Intubation Date/Time: 02/15/2015 12:29 PM Performed by: Shirlyn Goltz Pre-anesthesia Checklist: Patient identified, Emergency Drugs available, Suction available and Patient being monitored Patient Re-evaluated:Patient Re-evaluated prior to inductionOxygen Delivery Method: Circle system utilized Preoxygenation: Pre-oxygenation with 100% oxygen Intubation Type: IV induction Ventilation: Mask ventilation without difficulty Laryngoscope Size: Miller and 2 Grade  View: Grade I Tube type: Oral Tube size: 7.0 mm Number of attempts: 1 Airway Equipment and Method: Stylet and LTA kit utilized Placement Confirmation: ETT inserted through vocal cords under direct vision,  positive ETCO2 and breath sounds checked- equal and bilateral Secured at: 21 cm Tube secured with: Tape Dental Injury: Teeth and Oropharynx as per pre-operative assessment

## 2015-02-15 NOTE — Progress Notes (Signed)
Utilization review completed.  

## 2015-02-15 NOTE — Interval H&P Note (Signed)
History and Physical Interval Note:  02/15/2015 12:06 PM  Dorothy White  has presented today for surgery, with the diagnosis of right proximal humerus fracture  The various methods of treatment have been discussed with the patient and family. After consideration of risks, benefits and other options for treatment, the patient has consented to  Procedure(s): OPEN REDUCTION INTERNAL FIXATION (ORIF) PROXIMAL HUMERUS FRACTURE (Right) as a surgical intervention .  The patient's history has been reviewed, patient examined, no change in status, stable for surgery.  I have reviewed the patient's chart and labs.  Questions were answered to the patient's satisfaction.     Andjela Wickes,STEVEN R

## 2015-02-15 NOTE — Brief Op Note (Signed)
02/15/2015  1:57 PM  PATIENT:  Dorothy White  64 y.o. female  PRE-OPERATIVE DIAGNOSIS:  right proximal humerus fracture, comminuted and displaced  POST-OPERATIVE DIAGNOSIS:  right proximal humerus fracture, comminuted and displaced  PROCEDURE:  Procedure(s): OPEN REDUCTION INTERNAL FIXATION (ORIF) PROXIMAL HUMERUS FRACTURE (Right) Biomet SNP nail plate  SURGEON:  Surgeon(s) and Role:    * Netta Cedars, MD - Primary  PHYSICIAN ASSISTANT:   ASSISTANTS: Ventura Bruns PA-C   ANESTHESIA:   regional and general  EBL:  Total I/O In: 1000 [I.V.:1000] Out: 400 [Urine:200; Blood:200]  BLOOD ADMINISTERED:none  DRAINS: none   LOCAL MEDICATIONS USED:  NONE  SPECIMEN:  No Specimen  DISPOSITION OF SPECIMEN:  N/A  COUNTS:  YES  TOURNIQUET:  * No tourniquets in log *  DICTATION: .Other Dictation: Dictation Number [id number]  PLAN OF CARE: Admit to inpatient   PATIENT DISPOSITION:  PACU - hemodynamically stable.   Delay start of Pharmacological VTE agent (>24hrs) due to surgical blood loss or risk of bleeding: yes

## 2015-02-15 NOTE — Anesthesia Postprocedure Evaluation (Signed)
  Anesthesia Post-op Note  Patient: Dorothy White  Procedure(s) Performed: Procedure(s): OPEN REDUCTION INTERNAL FIXATION (ORIF) PROXIMAL HUMERUS FRACTURE (Right)  Patient Location: PACU  Anesthesia Type:General and GA combined with regional for post-op pain  Level of Consciousness: awake, alert , oriented and patient cooperative  Airway and Oxygen Therapy: Patient Spontanous Breathing  Post-op Pain: mild  Post-op Assessment: Post-op Vital signs reviewed, Patient's Cardiovascular Status Stable, Respiratory Function Stable, Patent Airway, No signs of Nausea or vomiting and Pain level controlled              Post-op Vital Signs: stable  Last Vitals:  Filed Vitals:   02/15/15 1521  BP: 108/42  Pulse: 77  Temp: 36.6 C  Resp: 16    Complications: No apparent anesthesia complications

## 2015-02-15 NOTE — Anesthesia Preprocedure Evaluation (Addendum)
Anesthesia Evaluation  Patient identified by MRN, date of birth, ID band Patient awake    Reviewed: Allergy & Precautions, NPO status , Patient's Chart, lab work & pertinent test results  History of Anesthesia Complications Negative for: history of anesthetic complications  Airway Mallampati: I       Dental   Pulmonary former smoker,    Pulmonary exam normal       Cardiovascular Normal cardiovascular exam+ dysrhythmias     Neuro/Psych  Headaches, PSYCHIATRIC DISORDERS Anxiety Depression CVA    GI/Hepatic Neg liver ROS, GERD-  ,  Endo/Other  negative endocrine ROS  Renal/GU negative Renal ROS     Musculoskeletal  (+) Arthritis -,   Abdominal   Peds  Hematology  (+) anemia ,   Anesthesia Other Findings   Reproductive/Obstetrics                            Anesthesia Physical Anesthesia Plan  ASA: III  Anesthesia Plan: General   Post-op Pain Management: GA combined w/ Regional for post-op pain   Induction: Intravenous  Airway Management Planned: Oral ETT  Additional Equipment:   Intra-op Plan:   Post-operative Plan: Extubation in OR  Informed Consent: I have reviewed the patients History and Physical, chart, labs and discussed the procedure including the risks, benefits and alternatives for the proposed anesthesia with the patient or authorized representative who has indicated his/her understanding and acceptance.     Plan Discussed with: CRNA, Anesthesiologist and Surgeon  Anesthesia Plan Comments:        Anesthesia Quick Evaluation

## 2015-02-15 NOTE — Op Note (Signed)
NAMEJOSSETTE, ZIRBEL NO.:  [id number]  MEDICAL RECORD NO.:  [id number]  LOCATION:  5N07C                        FACILITY:  [facility]  PHYSICIAN:  Doran Heater. Veverly Fells, M.D. DATE OF BIRTH:  1950/11/22  DATE OF PROCEDURE:  02/15/2015 DATE OF DISCHARGE:                              OPERATIVE REPORT   PREOPERATIVE DIAGNOSIS:  Right displaced comminuted proximal humerus fracture.  POSTOPERATIVE DIAGNOSIS:  Right displaced comminuted proximal humerus fracture.  PROCEDURE PERFORMED:  Open reduction and internal fixation of right proximal  humerus fracture using Biomet SNP nail plate.  ATTENDING SURGEON:  Doran Heater. Veverly Fells, MD.  ASSISTANT:  Abbott Pao. Dixon, PA-C, who scrubbed the entire procedure and necessary for satisfactory completion of surgery.  ANESTHESIA:  General anesthesia was used plus interscalene block.  ESTIMATED BLOOD LOSS:  Minimal.  FLUID REPLACED:  1000 mL crystalloids.  INSTRUMENT COUNTS:  Correct.  COMPLICATIONS:  There were no complications.  ANTIBIOTICS:  Perioperative antibiotics were given.  INDICATIONS:  The patient is a 64 year old female with history of fall, injuring her right shoulder.  The patient presents with  comminuted displaced proximal humerus fracture, which was followed non-operatively for a week, greater displacement was noted with the humeral shaft pulling anteriorly relative to the humeral head based on the unopposed pole of the pectoralis.  We discussed this with the patient and based on the progress of displacement of the fracture site and likely further displacement that would occur over the time, we recommended surgical management consisting of open reduction and internal fixation.  The patient agreed and informed consent was obtained.  DESCRIPTION OF PROCEDURE:  After adequate level of anesthesia was achieved, the  patient was positioned in the modified beach-chair position.  Right shoulder correctly identified and  sterilely prepped and draped in usual manner.  Left arm and upper extremity and remaining lower extremities were padded appropriately.  After time-out was called, we went ahead and entered the shoulder using standard deltopectoral incision starting at the coracoid process extending down to the anterior humeral shaft.  Dissection was down through subcutaneous tissues using Bovie.  We identified the cephalic vein took it laterally over the deltoid pectoralis taken medially.  We identified the upper cm of the pec, it was definitely pulling the humerus anteriorly.  We released that upper cm of pectoralis.  We were able to essentially shotgun the humeral shaft.  We used an awl to prepare the position for the SNP plate.  We then used a rongeur to remove a little bone from just posterior to the bicipital groove.  The biceps was good reference point.  We then slipped the SNP plate down into the shaft, reduced the shaft to the humeral head, and then utilized a retractor posterior to the humeral head to hold that humeral head in a position.  We also used a Cobb elevator to elevate the humeral head out of significant varus malalignment.  Once the shaft was aligned properly, we placed a central guide pin into the center of the humeral head.  We verified that position on multiplanar C- arm.  We then placed 4 smooth pegs into the proximal humeral fragment, these are locking screws.  Once those were in position,  we went ahead and placed 3 unicortical screws, locking the shaft to the SNP implant. Once we had all of her hardware in place, we verified hardware position and then also ranged the shoulder under fluoro, we had nice stable reduction with appropriate alignment of the fracture and restoration of humeral alignment and height.  We then went ahead and irrigated thoroughly.  We then did a suture into the rotator cuff posterior to greater tuberosity and brought that around into the subscapularis  just to reinforce this tuberosities to make sure this will not displace, so she began ranging and then doing her physical therapy.  We may go slow with basically motion-focused exercises initially and general ADLs, but no load bearing.  The patient tolerated the procedure well.  We then went ahead and repaired the deltopectoral interval with 0 Vicryl suture followed by 2-0 Vicryl subcutaneous closure and staples and sterile compressive bandages were applied.  The patient tolerated the surgery well.     Doran Heater. Veverly Fells, M.D.   ______________________________ Doran Heater. Veverly Fells, M.D.    SRN/MEDQ  D:  02/15/2015  T:  02/15/2015  Job:  [id number]

## 2015-02-15 NOTE — Transfer of Care (Signed)
Immediate Anesthesia Transfer of Care Note  Patient: Dorothy White  Procedure(s) Performed: Procedure(s): OPEN REDUCTION INTERNAL FIXATION (ORIF) PROXIMAL HUMERUS FRACTURE (Right)  Patient Location: PACU  Anesthesia Type:General and Regional  Level of Consciousness: awake, alert , oriented and patient cooperative  Airway & Oxygen Therapy: Patient Spontanous Breathing and Patient connected to nasal cannula oxygen  Post-op Assessment: Report given to RN, Post -op Vital signs reviewed and stable, Patient moving all extremities and Patient moving all extremities X 4  Post vital signs: Reviewed and stable  Last Vitals:  Filed Vitals:   02/15/15 1050  BP: 106/37  Pulse: 80  Temp:   Resp: 15    Complications: No apparent anesthesia complications

## 2015-02-16 ENCOUNTER — Encounter (HOSPITAL_COMMUNITY): Payer: Self-pay | Admitting: Orthopedic Surgery

## 2015-02-16 MED ORDER — HYDROMORPHONE HCL 1 MG/ML IJ SOLN
1.0000 mg | INTRAMUSCULAR | Status: DC | PRN
  Administered 2015-02-16 – 2015-02-17 (×5): 1 mg via INTRAVENOUS
  Filled 2015-02-16 (×6): qty 1

## 2015-02-16 MED ORDER — OXYCODONE HCL 5 MG PO TABS
5.0000 mg | ORAL_TABLET | ORAL | Status: DC | PRN
  Administered 2015-02-16 – 2015-02-17 (×6): 5 mg via ORAL
  Filled 2015-02-16 (×6): qty 1

## 2015-02-16 MED ORDER — OXYCODONE-ACETAMINOPHEN 5-325 MG PO TABS
1.0000 | ORAL_TABLET | ORAL | Status: DC | PRN
  Administered 2015-02-16 – 2015-02-17 (×6): 2 via ORAL
  Filled 2015-02-16 (×6): qty 2

## 2015-02-16 NOTE — Evaluation (Signed)
Occupational Therapy Evaluation Patient Details Name: Dorothy White MRN: [id number] DOB: [birth year] Today's Date: 02/16/2015    History of Present Illness right proximal humerus ORIF   Clinical Impression   This 64 yo female admitted and underwent above presents to acute OT with decreased ROM of dominant RUE, increased edema of RUE, mild decreased balance when up on her feet (probably due to pain meds), and increased pain all affecting her ability to care for herself at home at an independent level as she was pta. She will benefit from acute OT without need for follow up until Dr. Veverly Fells wants to increase her use of her RUE.    Follow Up Recommendations  No OT follow up    Equipment Recommendations  None recommended by OT       Precautions / Restrictions Precautions Precautions: Fall;Shoulder Shoulder Interventions: Shoulder sling/immobilizer;Off for dressing/bathing/exercises Required Braces or Orthoses: Sling Restrictions Weight Bearing Restrictions: Yes RUE Weight Bearing: Non weight bearing      Mobility Bed Mobility Overal bed mobility: Modified Independent             General bed mobility comments: HOB up getting out on right --sits straight up and then spins around with help of LUE  Transfers Overall transfer level: Needs assistance   Transfers: Sit to/from Stand Sit to Stand: Min guard              Balance Overall balance assessment: Needs assistance Sitting-balance support: No upper extremity supported;Feet supported Sitting balance-Leahy Scale: Good     Standing balance support: Single extremity supported Standing balance-Leahy Scale: Fair                              ADL Overall ADL's : Needs assistance/impaired Eating/Feeding: Set up;Sitting   Grooming: Set up;Supervision/safety (with min guard A standing)   Upper Body Bathing: Set up;Supervision/ safety;Standing   Lower Body Bathing: Set up;Supervison/ safety (with  min guard A standing)   Upper Body Dressing : Set up;Supervision/safety;Standing   Lower Body Dressing: Supervision/safety;Set up (with min guard A standing)   Toilet Transfer: Min guard;Ambulation;Comfort height toilet;Grab bars   Toileting- Clothing Manipulation and Hygiene: Minimal assistance (with min guard A standing)               Vision Additional Comments: No change from baseline          Pertinent Vitals/Pain Pain Assessment: 0-10 Pain Score: 7  Pain Location: right proximal UE Pain Descriptors / Indicators: Aching;Radiating;Sore Pain Intervention(s): Monitored during session;Repositioned;Patient requesting pain meds-RN notified     Hand Dominance Right   Extremity/Trunk Assessment Upper Extremity Assessment Upper Extremity Assessment: RUE deficits/detail RUE Deficits / Details: ORIF of proximal humerus, edema from mid forearm to mid upper arm causing decreased A/PROM of elbow, wrist and hand WNL RUE Coordination: decreased gross motor           Communication Communication Communication: No difficulties   Cognition Arousal/Alertness: Lethargic;Suspect due to medications Behavior During Therapy: [facility] for tasks assessed/performed Overall Cognitive Status: Within Functional Limits for tasks assessed                        Exercises   Other Exercises Other Exercises: 10 reps of lapslides (in sitting) and 10 reps of elbow flexion/extension in standing (educated on doing these in standing or laying down). Recommended she do the exercises 5 times a day 10 reps each.  Shoulder Instructions Shoulder Instructions Donning/doffing shirt without moving shoulder:  (verbalized understanding after I showed her technique) Method for sponge bathing under operated UE:  (verbalized understanding and return demonstrated with dry wash cloth after I showed her technique) Donning/doffing sling/immobilizer: Supervision/safety (at counter top) Correct positioning of  sling/immobilizer: Supervision/safety Pendulum exercises (written home exercise program):  (NA) ROM for elbow, wrist and digits of operated UE: Supervision/safety Sling wearing schedule (on at all times/off for ADL's): Independent Proper positioning of operated UE when showering:  (NA) Dressing change:  (NA)    Home Living Family/patient expects to be discharged to:: Private residence Living Arrangements: Alone Available Help at Discharge: Family;Friend(s);Available PRN/intermittently Type of Home: House             Bathroom Shower/Tub: Walk-in Psychologist, prison and probation services: Standard     Home Equipment: Hand held shower head          Prior Functioning/Environment Level of Independence: Independent             OT Diagnosis: Generalized weakness;Acute pain   OT Problem List: Decreased strength;Decreased range of motion;Impaired UE functional use;Pain;Impaired balance (sitting and/or standing)   OT Treatment/Interventions: Self-care/ADL training;Patient/family education;Balance training;Therapeutic exercise    OT Goals(Current goals can be found in the care plan section) Acute Rehab OT Goals Patient Stated Goal: home tomorrow OT Goal Formulation: With patient Time For Goal Achievement: 02/23/15 Potential to Achieve Goals: Good  OT Frequency: Min 3X/week   Barriers to D/C: Decreased caregiver support             End of Session Equipment Utilized During Treatment:  (sling) Nurse Communication: Patient requests pain meds  Activity Tolerance: Patient limited by pain Patient left: in chair;with call bell/phone within reach (told her she could get up to [employer] by herself from recliner if she is not feeling drowsy/loopy from pain meds)   Time: 1610-9604 OT Time Calculation (min): 53 min Charges:  OT General Charges $OT Visit: 1 Procedure OT Evaluation $Initial OT Evaluation Tier I: 1 Procedure OT Treatments $Self Care/Home Management : 23-37 mins $Therapeutic  Exercise: 8-22 mins  Almon Register [phone] 02/16/2015, 11:14 AM

## 2015-02-16 NOTE — Progress Notes (Signed)
OT Cancellation Note  Patient Details Name: Dorothy White MRN: [id number] DOB: 09-Jan-1951   Cancelled Treatment:    Reason Eval/Treat Not Completed: Other (comment). Pt 8/10 pain and not due for any more pain meds until 10:00am (IV). Have asked her RN [name]) to try and get pt her pain meds as close to 10:00am as she can so I can see pt not long after. Pt reports she does not feel her pain has been controlled all night. Of note she is falling asleep as I am trying to talk to her. I called and got clarification from Dr. Veverly Fells about lap slides or no A/PROM of shoulder and OK to do lap slides. I applied ice to pt's shoulder to see if that would help with pain, got her something to drink and replaced the hand sanitizer in room.  Almon Register [phone] 02/16/2015, 9:03 AM

## 2015-02-16 NOTE — Progress Notes (Signed)
Orthopedics Progress Note  Subjective: Right shoulder painful  Objective:  Filed Vitals:   02/16/15 0340  BP: 130/44  Pulse: 82  Temp: 99.5 F (37.5 C)  Resp: 15    General: Awake and alert  Musculoskeletal: right shoulder dressing clean and dry and intact Neurovascularly intact  Lab Results  Component Value Date   WBC 6.7 02/15/2015   HGB 10.9* 02/15/2015   HCT 35.0* 02/15/2015   MCV 90.4 02/15/2015   PLT 386 02/15/2015       Component Value Date/Time   NA 139 02/15/2015 0955   K 4.1 02/15/2015 0955   CL 105 02/15/2015 0955   CO2 27 02/15/2015 0955   GLUCOSE 103* 02/15/2015 0955   BUN 10 02/15/2015 0955   CREATININE 0.58 02/15/2015 0955   CALCIUM 9.2 02/15/2015 0955   CALCIUM 9.4 01/13/2007 2356   GFRNONAA >60 02/15/2015 0955   GFRAA >60 02/15/2015 0955    Lab Results  Component Value Date   INR 0.91 03/29/2014    Assessment/Plan: POD #1 s/p Procedure(s): OPEN REDUCTION INTERNAL FIXATION (ORIF) PROXIMAL HUMERUS FRACTURE OT and pain control are our main focuses right now. Likely d/c tomorrow or Sunday based upon pain control  Doran Heater. Veverly Fells, MD 02/16/2015 12:58 PM

## 2015-02-17 NOTE — Progress Notes (Signed)
Dressing changed and extra dressings gave to pt per MD request. Discharge instructions gave to pt and all questions answered. Pt is ready to discharge.

## 2015-02-17 NOTE — Progress Notes (Signed)
Occupational Therapy Treatment Patient Details Name: Dorothy White MRN: [id number] DOB: 05-25-1951 Today's Date: 02/17/2015    History of present illness right proximal humerus ORIF   OT comments  Pt alert with pain moderate in R shoulder.  Educated in ADL, lap slides and AROM R elbow to hand. Pt is eager to go home. She has 24 hour care.  Follow Up Recommendations  No OT follow up    Equipment Recommendations  None recommended by OT    Recommendations for Other Services      Precautions / Restrictions Precautions Precautions: Fall;Shoulder Shoulder Interventions: Shoulder sling/immobilizer;Off for dressing/bathing/exercises Required Braces or Orthoses: Sling Restrictions RUE Weight Bearing: Non weight bearing       Mobility Bed Mobility Overal bed mobility: Modified Independent             General bed mobility comments: HOB up, recommended pt get up to L side at home  Transfers Overall transfer level: Modified independent                    Balance                                   ADL Overall ADL's : Needs assistance/impaired     Grooming: Wash/dry face;Set up;Standing   Upper Body Bathing: Sitting;Minimal assitance Upper Body Bathing Details (indicate cue type and reason): assisted with back Lower Body Bathing: Modified independent;Sit to/from stand   Upper Body Dressing : Minimal assistance;Sitting Upper Body Dressing Details (indicate cue type and reason): instructed in hemitechniques, assist for sling Lower Body Dressing: Modified independent;Sit to/from stand   Toilet Transfer: Modified Independent;Ambulation;Regular Toilet   Toileting- Clothing Manipulation and Hygiene: Modified independent;Sit to/from stand       Functional mobility during ADLs: Modified independent General ADL Comments: Instructed pt in donning sling at counter top if no help is available.  Place egg crate foam in sling as pt was complaining of  pain along her forearm.      Vision                     Perception     Praxis      Cognition   Behavior During Therapy: WFL for tasks assessed/performed Overall Cognitive Status: Within Functional Limits for tasks assessed                       Extremity/Trunk Assessment               Exercises     Shoulder Instructions       General Comments      Pertinent Vitals/ Pain       Pain Assessment: 0-10 Pain Score: 6  Pain Location: R shoulder Pain Descriptors / Indicators: Aching Pain Intervention(s): Limited activity within patient's tolerance;Monitored during session;Patient requesting pain meds-RN notified;Repositioned;Ice applied  Home Living                                          Prior Functioning/Environment              Frequency Min 3X/week     Progress Toward Goals  OT Goals(current goals can now be found in the care plan section)  Progress towards OT goals: Progressing toward goals  Plan Discharge plan remains appropriate    Co-evaluation                 End of Session     Activity Tolerance Patient limited by pain   Patient Left in chair;with call bell/phone within reach;with nursing/sitter in room   Nurse Communication Patient requests pain meds        Time: 8159-4707 OT Time Calculation (min): 54 min  Charges: OT General Charges $OT Visit: 1 Procedure OT Treatments $Self Care/Home Management : 53-67 mins  Malka So 02/17/2015, 10:18 AM  [phone]

## 2015-02-17 NOTE — Progress Notes (Signed)
Subjective: 2 Days Post-Op Procedure(s) (LRB): OPEN REDUCTION INTERNAL FIXATION (ORIF) PROXIMAL HUMERUS FRACTURE (Right) Patient reports pain as moderate.  Pain well controlled. Feels she would be ready to go home today as long as she has pain meds to take at home. Seen by myself and Dr. Gladstone Lighter in AM rounds.  Objective: Vital signs in last 24 hours: Temp:  [98.1 F (36.7 C)-98.5 F (36.9 C)] 98.2 F (36.8 C) (07/23 0546) Pulse Rate:  [75-79] 78 (07/23 0546) Resp:  [15-17] 17 (07/23 0546) BP: (112-132)/(42-51) 114/42 mmHg (07/23 0546) SpO2:  [95 %-97 %] 95 % (07/23 0546)  Intake/Output from previous day: 07/22 0701 - 07/23 0700 In: 1333.2 [P.O.:940; I.V.:393.2] Out: 400 [Urine:400] Intake/Output this shift:     Recent Labs  02/15/15 0955  HGB 10.9*    Recent Labs  02/15/15 0955  WBC 6.7  RBC 3.87  HCT 35.0*  PLT 386    Recent Labs  02/15/15 0955  NA 139  K 4.1  CL 105  CO2 27  BUN 10  CREATININE 0.58  GLUCOSE 103*  CALCIUM 9.2   No results for input(s): LABPT, INR in the last 72 hours.  Neurologically intact ABD soft Neurovascular intact Sensation intact distally Intact pulses distally Dorsiflexion/Plantar flexion intact Incision: dressing C/D/I and no drainage No cellulitis present Compartment soft no sign of DVT  Assessment/Plan: 2 Days Post-Op Procedure(s) (LRB): OPEN REDUCTION INTERNAL FIXATION (ORIF) PROXIMAL HUMERUS FRACTURE (Right) Advance diet Up with therapy D/C IV fluids  Dressing change today Discussed D/C instructions, dressing instructions D/C home today, follow up with Dr. Veverly Fells in office as directed  Lacie Draft M. 02/17/2015, 8:12 AM

## 2015-02-17 NOTE — Discharge Summary (Signed)
Patient ID: Dorothy White MRN: [id number] DOB/AGE: [birth year] 64 y.o.  Admit date: 02/15/2015 Discharge date: 02/17/2015  Admission Diagnoses:  Active Problems:   Shoulder fracture   Discharge Diagnoses:  Same  Past Medical History  Diagnosis Date  . Arrhythmia   . Diverticulitis   . Depression   . Anxiety   . HLD (hyperlipidemia)   . Chronic back pain   . Stroke     "mini" stroke - 06/2013  . Headache(784.0)     Migraines  . GERD (gastroesophageal reflux disease)   . Arthritis   . Cancer     basal cell skin cancer  . Anemia   . Constipation     Surgeries: Procedure(s): OPEN REDUCTION INTERNAL FIXATION (ORIF) PROXIMAL HUMERUS FRACTURE on 02/15/2015   Consultants:    Discharged Condition: Improved  Hospital Course: Dorothy White is an 64 y.o. female who was admitted 02/15/2015 for operative treatment of<principal problem not specified>. Patient has severe unremitting pain that affects sleep, daily activities, and work/hobbies. After pre-op clearance the patient was taken to the operating room on 02/15/2015 and underwent  Procedure(s): OPEN REDUCTION INTERNAL FIXATION (ORIF) PROXIMAL HUMERUS FRACTURE.    Patient was given perioperative antibiotics: Anti-infectives    Start     Dose/Rate Route Frequency Ordered Stop   02/15/15 1830  clindamycin (CLEOCIN) IVPB 600 mg     600 mg 100 mL/hr over 30 Minutes Intravenous Every 6 hours 02/15/15 1529 02/16/15 0700   02/15/15 1130  clindamycin (CLEOCIN) IVPB 900 mg     900 mg 100 mL/hr over 30 Minutes Intravenous To [facility] 02/14/15 1255 02/15/15 1305       Patient was given sequential compression devices, early ambulation, and chemoprophylaxis to prevent DVT.  Patient benefited maximally from hospital stay and there were no complications.    Recent vital signs: Patient Vitals for the past 24 hrs:  BP Temp Temp src Pulse Resp SpO2  02/17/15 0546 (!) 114/42 mmHg 98.2 F (36.8 C) Oral 78 17 95 %   02/17/15 0025 (!) 112/47 mmHg 98.1 F (36.7 C) Oral 75 15 96 %  02/16/15 2048 (!) 112/43 mmHg 98.4 F (36.9 C) Oral 79 17 97 %  02/16/15 1418 (!) 132/51 mmHg 98.5 F (36.9 C) Oral 78 16 95 %     Recent laboratory studies:  Recent Labs  02/15/15 0955  WBC 6.7  HGB 10.9*  HCT 35.0*  PLT 386  NA 139  K 4.1  CL 105  CO2 27  BUN 10  CREATININE 0.58  GLUCOSE 103*  CALCIUM 9.2     Discharge Medications:     Medication List    STOP taking these medications        atorvastatin 20 MG tablet  Commonly known as:  LIPITOR     carisoprodol 350 MG tablet  Commonly known as:  SOMA     morphine 30 MG 12 hr tablet  Commonly known as:  MS CONTIN     topiramate 25 MG capsule  Commonly known as:  TOPAMAX      TAKE these medications        buPROPion 300 MG 24 hr tablet  Commonly known as:  WELLBUTRIN XL  Take 300 mg by mouth daily. Taken with 150mg  tablet to equal 450mg  daily.     buPROPion 150 MG 24 hr tablet  Commonly known as:  WELLBUTRIN XL  Take 150 mg by mouth daily. Taken with 300mg  tablet to equal 450mg  daily.  calcium-vitamin D 500-200 MG-UNIT per tablet  Commonly known as:  OSCAL WITH D  Take 1 tablet by mouth daily with breakfast.     clonazePAM 1 MG tablet  Commonly known as:  KLONOPIN  Take 1 mg by mouth at bedtime as needed (anxiety/sleep.).     fexofenadine 180 MG tablet  Commonly known as:  ALLEGRA  Take 180 mg by mouth at bedtime.     FLUoxetine 20 MG capsule  Commonly known as:  PROZAC  Take 60 mg by mouth daily.     fluticasone 50 MCG/ACT nasal spray  Commonly known as:  FLONASE  Place 2 sprays into both nostrils daily as needed for allergies.     HYDROmorphone 2 MG tablet  Commonly known as:  DILAUDID  Take 2 mg by mouth every 4 (four) hours as needed for severe pain (may take 1 tab every 4-6 hour).     ibuprofen 400 MG tablet  Commonly known as:  ADVIL,MOTRIN  Take 1 tablet (400 mg total) by mouth every 6 (six) hours as needed.      methocarbamol 500 MG tablet  Commonly known as:  ROBAXIN  Take 1 tablet (500 mg total) by mouth 3 (three) times daily as needed.     montelukast 10 MG tablet  Commonly known as:  SINGULAIR  Take 10 mg by mouth daily.     oxyCODONE-acetaminophen 5-325 MG per tablet  Commonly known as:  ROXICET  Take 1-2 tablets by mouth every 4 (four) hours as needed for severe pain.     SUMAtriptan 25 MG tablet  Commonly known as:  IMITREX  Take 2 tablets (50 mg total) by mouth once. May take 50mg  at onset of migraine and repeat once after 2 hours. Maximum daily dose 200mg      traZODone 100 MG tablet  Commonly known as:  DESYREL  Take 200 mg by mouth at bedtime.        Diagnostic Studies: Dg Ribs Unilateral W/chest Right  02/04/2015   CLINICAL DATA:  [place] today.  Right-sided chest pain.  EXAM: RIGHT RIBS AND CHEST - 3+ VIEW  COMPARISON:  Chest CT 12/19/2014  FINDINGS: The cardiac silhouette, mediastinal and hilar contours are within normal limits and stable. No acute pulmonary findings.  Dedicated views of the right ribs do not demonstrate any definite acute right-sided rib fractures.  IMPRESSION: No acute cardiopulmonary findings and no definite rib fractures.  Right Humeral head and neck fractures noted.   Electronically Signed   By: Marijo Sanes M.D.   On: 02/04/2015 16:47   Dg Thoracic Spine 2 View  02/04/2015   CLINICAL DATA:  Fall.  Pain in chest and shoulder.  EXAM: THORACIC SPINE - 2-3 VIEWS  COMPARISON:  12/19/2014  FINDINGS: Thoracic spondylosis.  Mild levoconvex thoracic scoliosis.  Poor visibility of the thoracic spine on the lateral projections, much of which is due to arm positioning and underpenetration. The upper half of the thoracic spine is especially obscured, with very poor definition of the thoracic vertebral cortex. The cervicothoracic junction is not visualized.  IMPRESSION: 1. Reduce sensitivity to to underpenetration, much of which is due to difficulty with positioning of the  patient's right arm (the patient has a displaced right proximal humeral fracture). No obvious thoracic spine fracture identified but the lateral projections are limited.   Electronically Signed   By: Van Clines M.D.   On: 02/04/2015 16:49   Dg Shoulder Right  02/04/2015   CLINICAL DATA:  Fall.  Pain in the right shoulder.  EXAM: RIGHT SHOULDER - 2+ VIEW  COMPARISON:  None.  FINDINGS: Proximal humeral metaphyseal fracture noted with varus angulation in several small marginal fragments. Questionable linear fracture involving the greater tuberosity. The dominant fracture plane appears displaced over 1 cm.  Thoracic spondylosis.  IMPRESSION: 1. Right humeral surgical neck fracture is displaced by greater than 1 cm. Several small fragments are observed. Questionable linear fracture extension into the greater tuberosity.   Electronically Signed   By: Van Clines M.D.   On: 02/04/2015 15:33   Dg Elbow 2 Views Right  02/04/2015   CLINICAL DATA:  Acute right elbow pain after fall today. Initial encounter.  EXAM: RIGHT ELBOW - 2 VIEW  COMPARISON:  None.  FINDINGS: There is no evidence of fracture, dislocation, or joint effusion. There is no evidence of arthropathy or other focal bone abnormality. Soft tissues are unremarkable.  IMPRESSION: Normal right elbow.   Electronically Signed   By: Marijo Conception, M.D.   On: 02/04/2015 16:49   Ct Head Wo Contrast  02/04/2015   CLINICAL DATA:  Fall, memory loss  EXAM: CT HEAD WITHOUT CONTRAST  TECHNIQUE: Contiguous axial images were obtained from the base of the skull through the vertex without intravenous contrast.  COMPARISON:  MRI brain dated 09/28/2014  FINDINGS: No evidence of parenchymal hemorrhage or extra-axial fluid collection. No mass lesion, mass effect, or midline shift.  No CT evidence of acute infarction.  Mild subcortical white matter and periventricular small vessel ischemic changes.  Cerebral volume is within normal limits.  No ventriculomegaly.   The visualized paranasal sinuses are essentially clear. The mastoid air cells are unopacified.  No evidence of calvarial fracture.  IMPRESSION: No evidence of acute intracranial abnormality.  Mild small vessel ischemic changes.   Electronically Signed   By: Julian Hy M.D.   On: 02/04/2015 17:04   Dg Shoulder Right Port  02/15/2015   CLINICAL DATA:  Postop right proximal humeral fracture fixation.  EXAM: PORTABLE RIGHT SHOULDER - 1 VIEW  COMPARISON:  Radiographs 02/04/2015.  FINDINGS: Single AP portable examination. Comminuted fracture of the right humeral neck demonstrates stable mild displacement status post interval open reduction and internal fixation. Mild inferior subluxation of the humeral head is likely secondary to an underlying hemarthrosis. No dislocation suggested on single AP imaging. The glenoid appears intact.  IMPRESSION: Postop right humeral ORIF without demonstrated complication.   Electronically Signed   By: Richardean Sale M.D.   On: 02/15/2015 14:51   Dg Humerus Right  02/04/2015   CLINICAL DATA:  [place] today.  Right shoulder pain.  EXAM: RIGHT HUMERUS - 2+ VIEW  COMPARISON:  None.  FINDINGS: Displaced humeral head and neck fractures are again demonstrated. No humeral shaft fracture. The elbow joint is intact.  IMPRESSION: Humeral head and neck fractures but no humeral shaft fracture.   Electronically Signed   By: Marijo Sanes M.D.   On: 02/04/2015 17:49    Disposition: 01-Home or Self Care      Discharge Instructions    Call MD / Call 911    Complete by:  As directed   If you experience chest pain or shortness of breath, CALL 911 and be transported to the hospital emergency room.  If you develope a fever above 101 F, pus (white drainage) or increased drainage or redness at the wound, or calf pain, call your surgeon's office.     Constipation Prevention    Complete by:  As directed  Drink plenty of fluids.  Prune juice may be helpful.  You may use a stool softener,  such as Colace (over the counter) 100 mg twice a day.  Use MiraLax (over the counter) for constipation as needed.     Diet - low sodium heart healthy    Complete by:  As directed      Increase activity slowly as tolerated    Complete by:  As directed            Follow-up Information    Follow up with NORRIS,STEVEN R, MD. Call in 2 weeks.   Specialty:  Orthopedic Surgery   Why:  [phone]   Contact information:   [address] [place] [address] [phone]        Signed: Cecilie Kicks. 02/17/2015, 8:17 AM

## 2015-05-06 IMAGING — CR DG FOOT COMPLETE 3+V*R*
3 series · 3 of 3 positions shown · non-contrast
Comparison: 02/22/2008

CLINICAL DATA: Foot injury, pain.

EXAM:
RIGHT FOOT COMPLETE - 3+ VIEW

[view not recorded (1 of 3)]
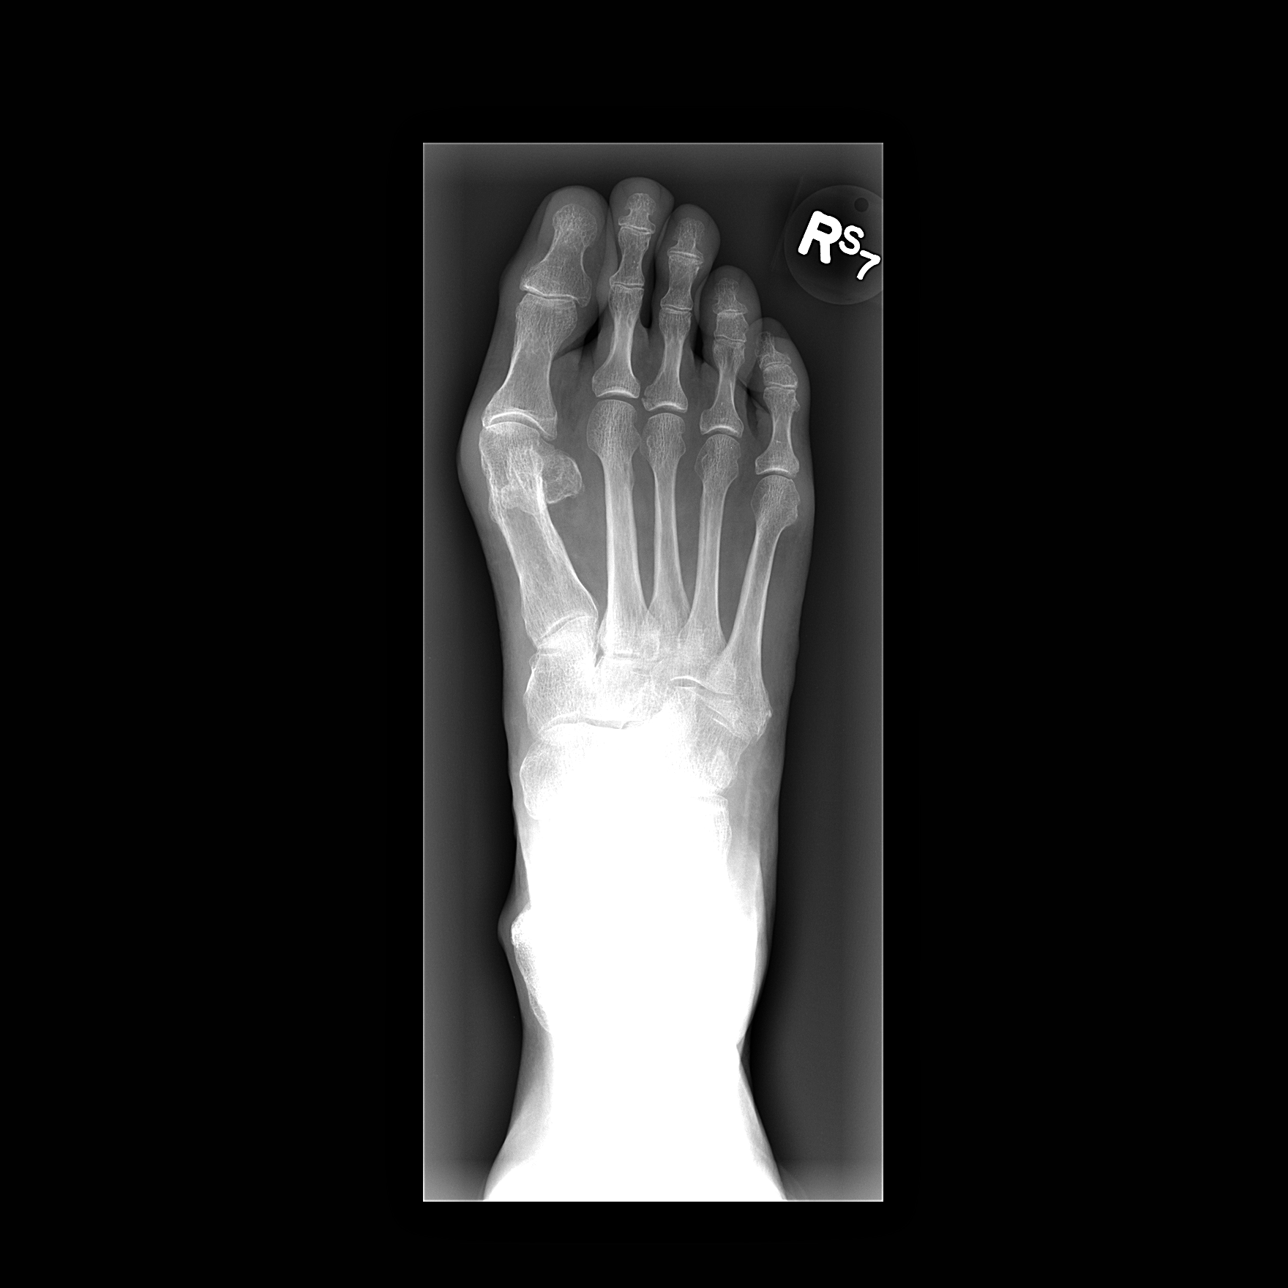

[view not recorded (2 of 3)]
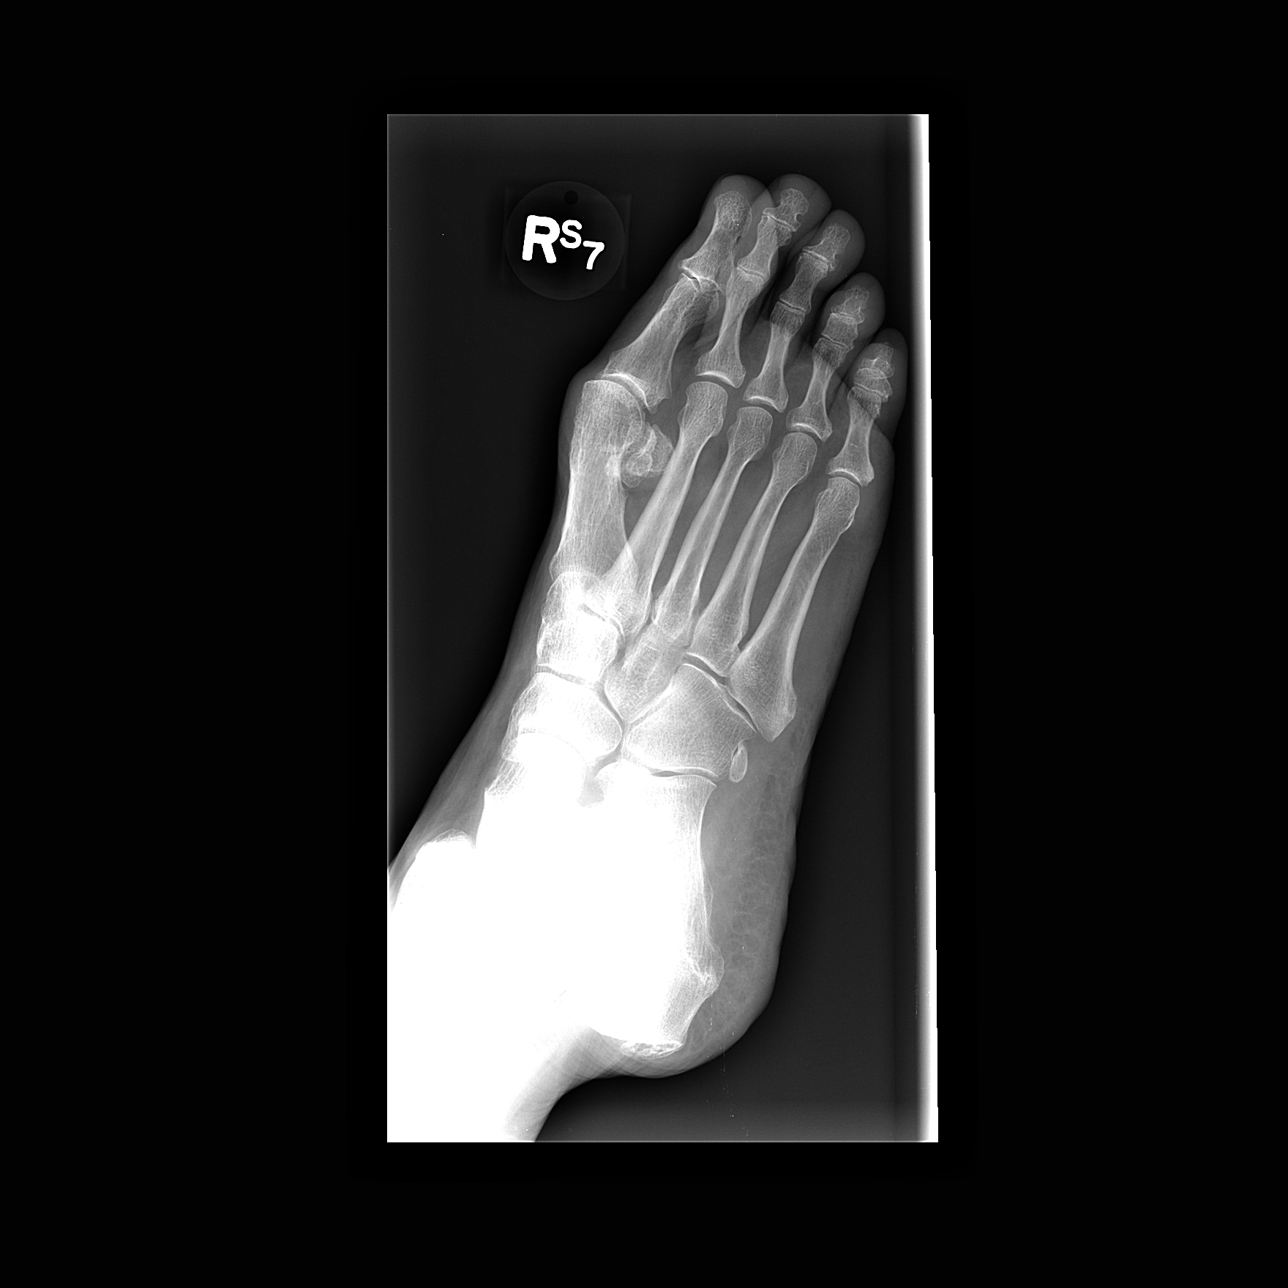

[view not recorded (3 of 3)]
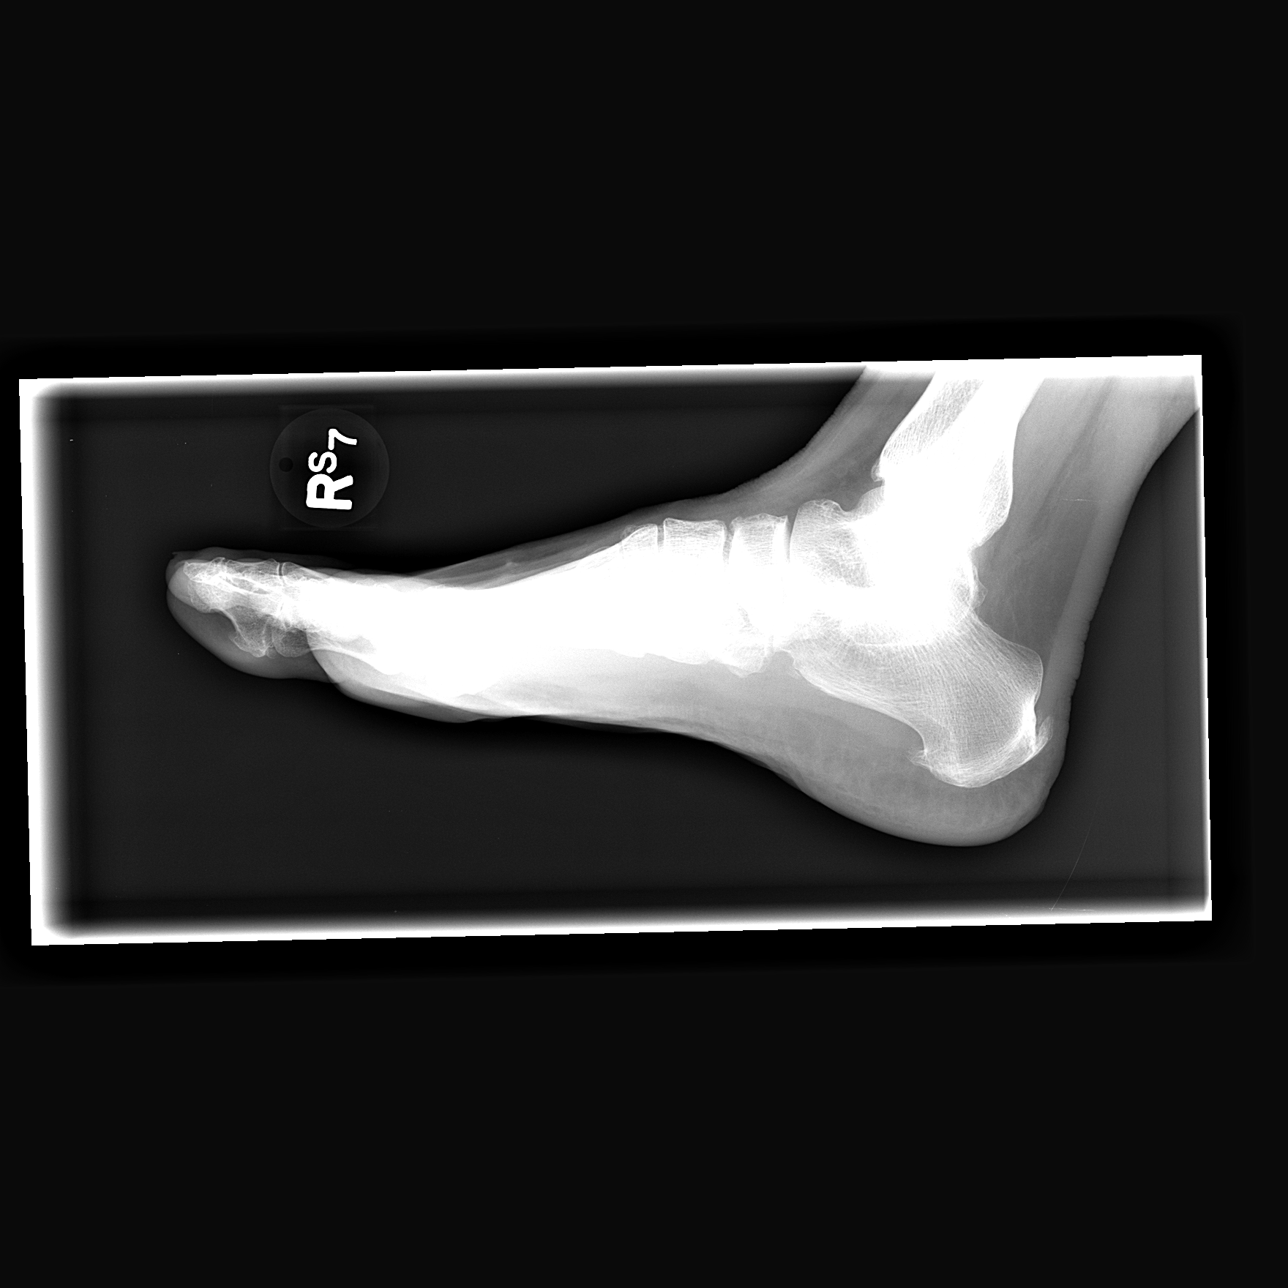

[3 of 3 positions shown; findings below may reference images not displayed]

FINDINGS: There is a fracture at the base of the right 3rd proximal phalanx
entering the 3rd MTP joint. Minimal displacement. No additional
acute bony abnormality. Degenerative changes and hallux valgus at
the 1st MTP joint.
IMPRESSION: Fracture at the base of the right 3rd proximal phalanx.

## 2015-05-10 IMAGING — US US ABDOMEN COMPLETE
1 series · 14 of 25 positions shown · non-contrast
Comparison: CT of the abdomen on 07/21/2011.

CLINICAL DATA: Right-sided abdominal pain.

EXAM:
ABDOMEN ULTRASOUND

[Series 1: us abdomen complete · 0.26mm/px · 14 of 48 slices shown]
[im 1/48]
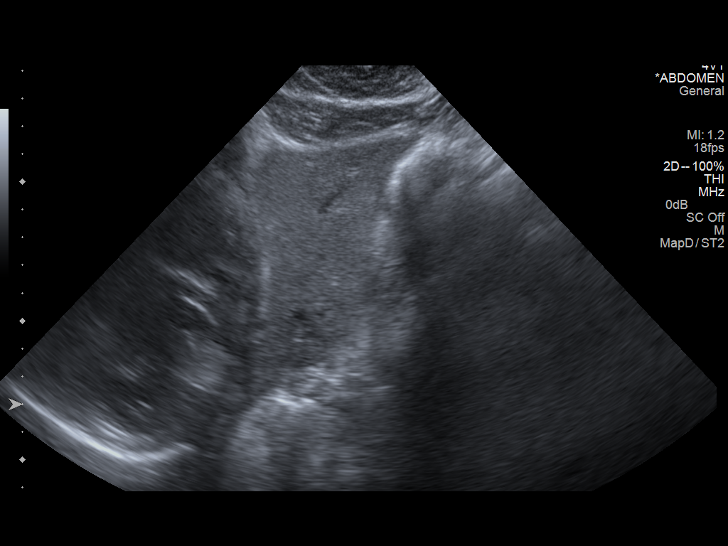
[im 4/48]
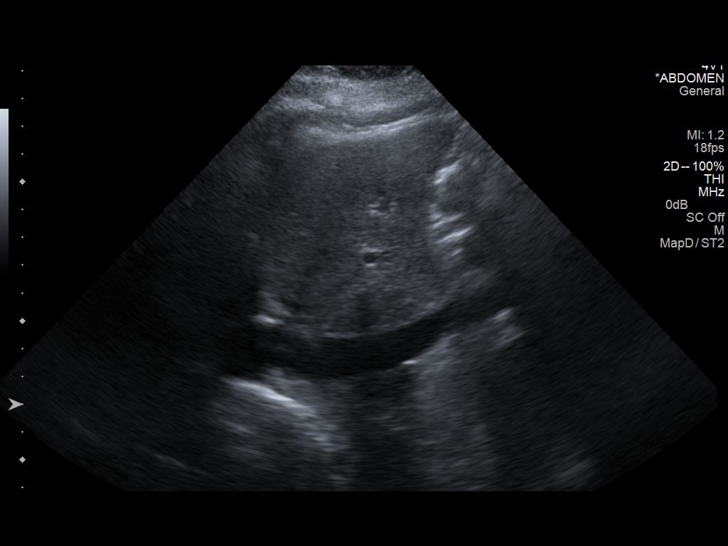
[im 8/48]
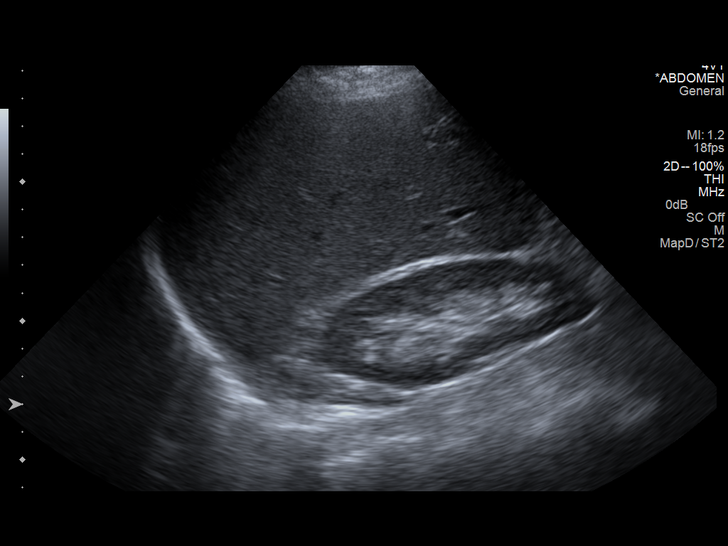
[im 12/48]
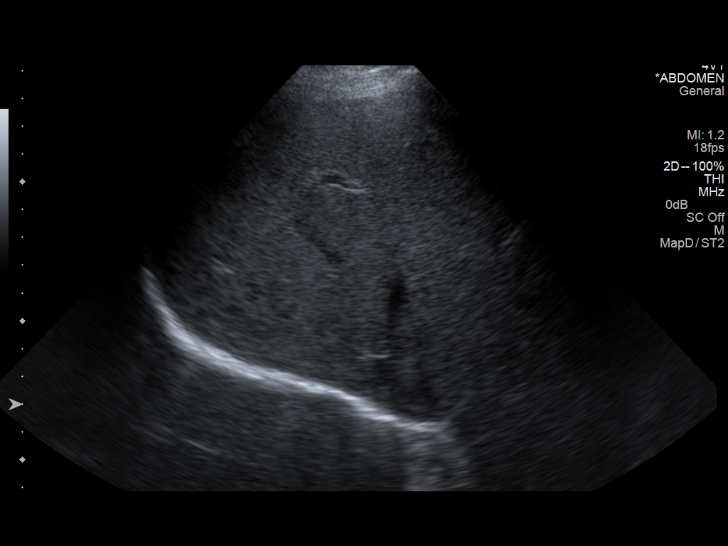
[im 16/48]
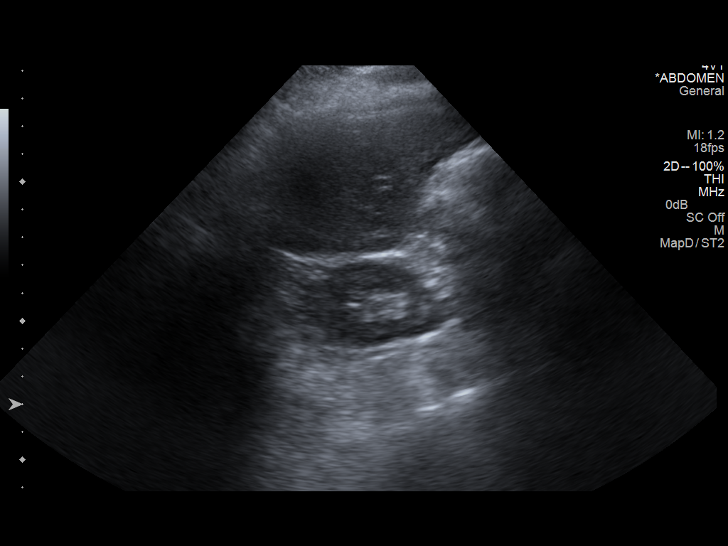
[im 18/48]
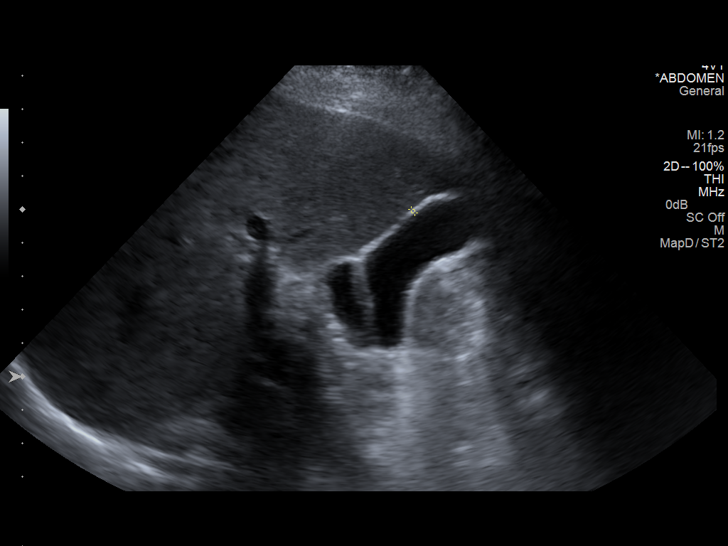
[im 22/48]
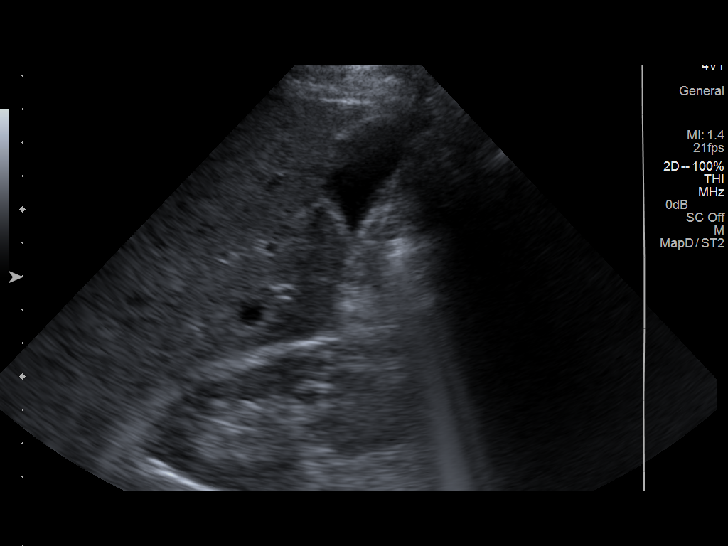
[im 26/48]
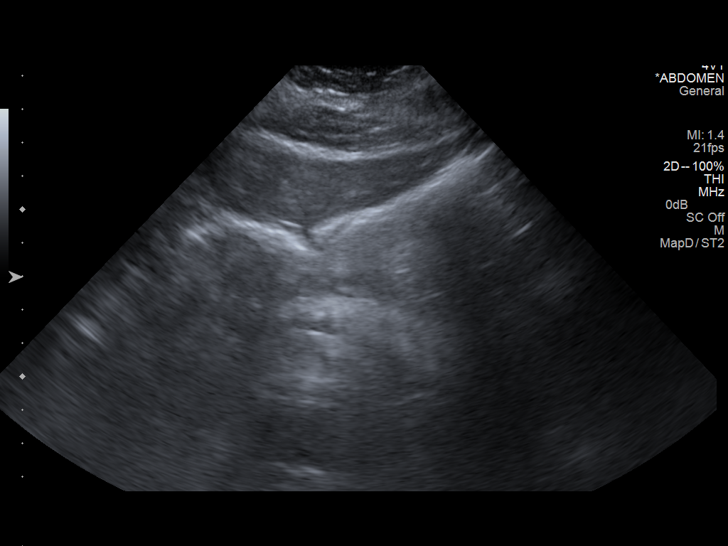
[im 30/48]
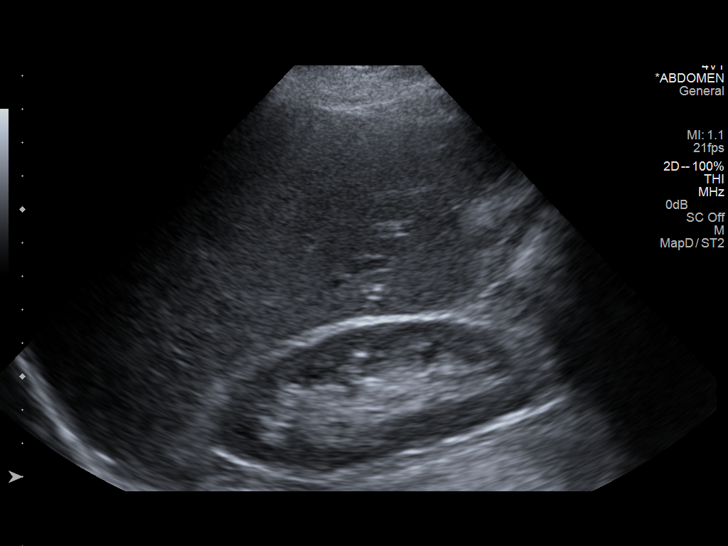
[im 32/48]
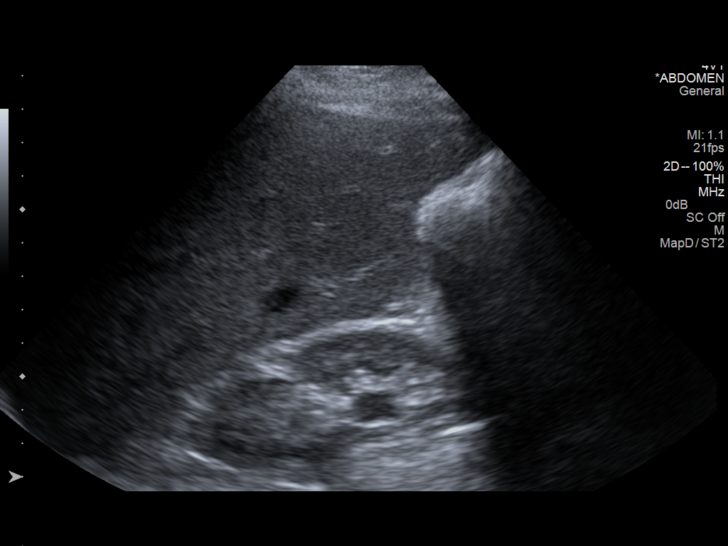
[im 36/48]
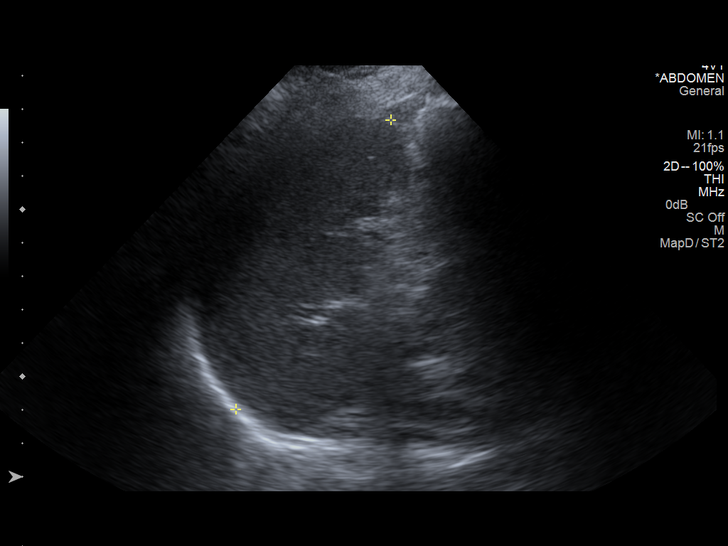
[im 40/48]
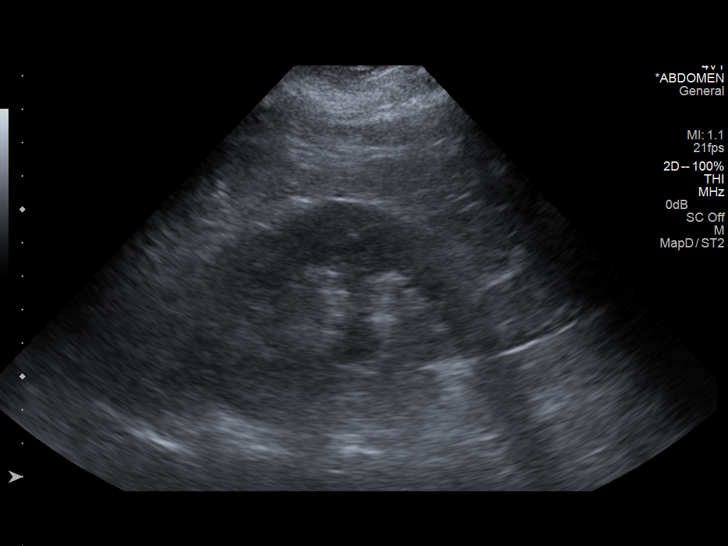
[im 44/48]
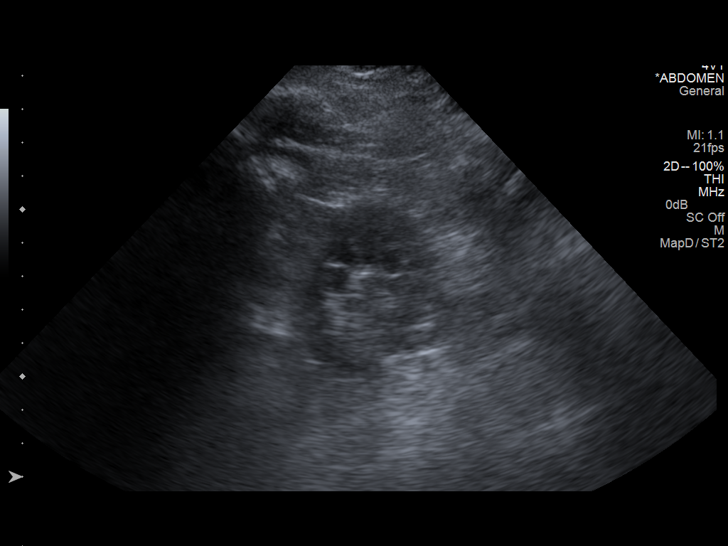
[im 48/48]
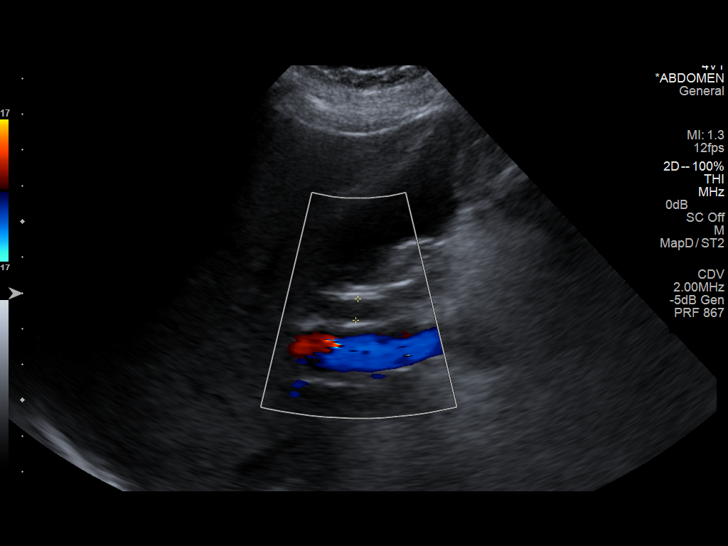

[14 of 25 positions shown; findings below may reference images not displayed]

FINDINGS: Gallbladder

No gallstones or wall thickening. Negative sonographic Murphy's
sign.

Common bile duct

Diameter: 6.0 mm

Liver

No focal lesion identified. Within normal limits in parenchymal
echogenicity.

IVC

No abnormality visualized.

Pancreas

Visualized portion unremarkable.

Spleen

Size and appearance within normal limits.

Right Kidney

Length: 10.7 cm Echogenicity within normal limits. No mass or
hydronephrosis visualized.

Left Kidney

Length: 10.6 cm Echogenicity within normal limits. No mass or
hydronephrosis visualized.

Abdominal aorta

No aneurysm visualized.
IMPRESSION: Negative abdominal ultrasound.

## 2015-05-30 LAB — HM MAMMOGRAPHY

## 2015-07-04 IMAGING — CR DG FINGER LITTLE 2+V*L*
3 series · 3 of 3 positions shown · non-contrast
Comparison: 04/30/2008

CLINICAL DATA: Finger injury and pain

EXAM:
LEFT LITTLE FINGER 2+V

[x finger pa left]
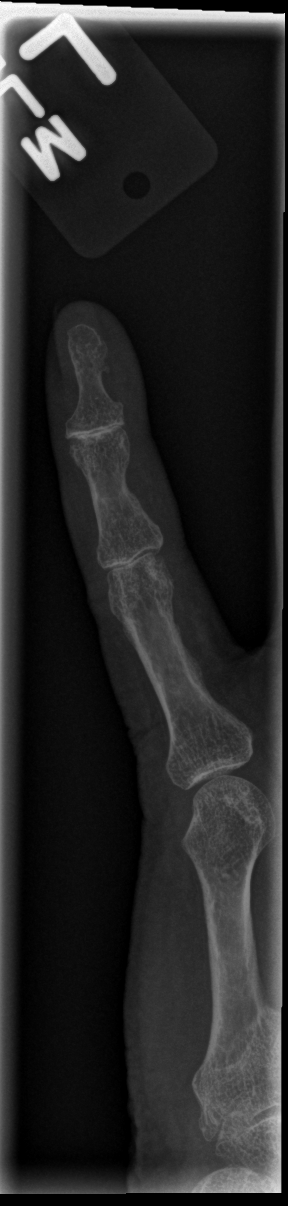

[x finger obl left]
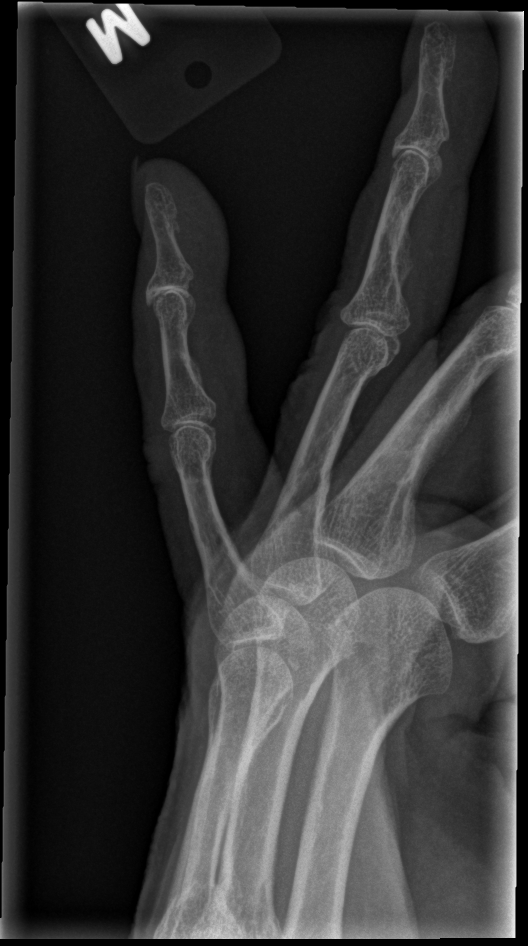

[x finger lat left]
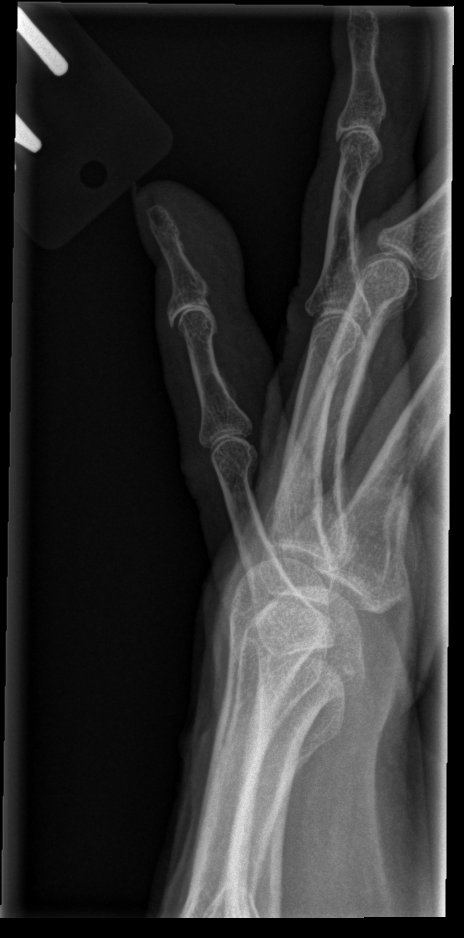

[3 of 3 positions shown; findings below may reference images not displayed]

FINDINGS: There is no fracture or dislocation. . There is degenerative joint
disease of the 5th DIP joint.
IMPRESSION: No acute osseous injury of the left 5th digit.

## 2015-07-09 ENCOUNTER — Emergency Department (HOSPITAL_COMMUNITY): Payer: 59

## 2015-07-09 ENCOUNTER — Emergency Department (HOSPITAL_COMMUNITY)
Admission: EM | Admit: 2015-07-09 | Discharge: 2015-07-10 | Disposition: A | Discharge status: Home / Self Care | Payer: 59 | Attending: Emergency Medicine | Admitting: Emergency Medicine

## 2015-07-09 ENCOUNTER — Encounter (HOSPITAL_COMMUNITY): Payer: Self-pay

## 2015-07-09 DIAGNOSIS — Z8639 Personal history of other endocrine, nutritional and metabolic disease: Secondary | ICD-10-CM | POA: Insufficient documentation

## 2015-07-09 DIAGNOSIS — S0181XA Laceration without foreign body of other part of head, initial encounter: Secondary | ICD-10-CM | POA: Diagnosis not present

## 2015-07-09 DIAGNOSIS — W01198A Fall on same level from slipping, tripping and stumbling with subsequent striking against other object, initial encounter: Secondary | ICD-10-CM | POA: Insufficient documentation

## 2015-07-09 DIAGNOSIS — G8929 Other chronic pain: Secondary | ICD-10-CM | POA: Insufficient documentation

## 2015-07-09 DIAGNOSIS — Z87891 Personal history of nicotine dependence: Secondary | ICD-10-CM | POA: Diagnosis not present

## 2015-07-09 DIAGNOSIS — Z880 Allergy status to penicillin: Secondary | ICD-10-CM | POA: Insufficient documentation

## 2015-07-09 DIAGNOSIS — Z862 Personal history of diseases of the blood and blood-forming organs and certain disorders involving the immune mechanism: Secondary | ICD-10-CM | POA: Insufficient documentation

## 2015-07-09 DIAGNOSIS — Y9389 Activity, other specified: Secondary | ICD-10-CM | POA: Insufficient documentation

## 2015-07-09 DIAGNOSIS — S060X0A Concussion without loss of consciousness, initial encounter: Secondary | ICD-10-CM | POA: Diagnosis not present

## 2015-07-09 DIAGNOSIS — Y92009 Unspecified place in unspecified non-institutional (private) residence as the place of occurrence of the external cause: Secondary | ICD-10-CM | POA: Insufficient documentation

## 2015-07-09 DIAGNOSIS — Z85828 Personal history of other malignant neoplasm of skin: Secondary | ICD-10-CM | POA: Diagnosis not present

## 2015-07-09 DIAGNOSIS — Z79899 Other long term (current) drug therapy: Secondary | ICD-10-CM | POA: Diagnosis not present

## 2015-07-09 DIAGNOSIS — F419 Anxiety disorder, unspecified: Secondary | ICD-10-CM | POA: Diagnosis not present

## 2015-07-09 DIAGNOSIS — G43909 Migraine, unspecified, not intractable, without status migrainosus: Secondary | ICD-10-CM | POA: Insufficient documentation

## 2015-07-09 DIAGNOSIS — S0990XA Unspecified injury of head, initial encounter: Secondary | ICD-10-CM | POA: Diagnosis present

## 2015-07-09 DIAGNOSIS — Z8673 Personal history of transient ischemic attack (TIA), and cerebral infarction without residual deficits: Secondary | ICD-10-CM | POA: Diagnosis not present

## 2015-07-09 DIAGNOSIS — Z8719 Personal history of other diseases of the digestive system: Secondary | ICD-10-CM | POA: Insufficient documentation

## 2015-07-09 DIAGNOSIS — S199XXA Unspecified injury of neck, initial encounter: Secondary | ICD-10-CM | POA: Insufficient documentation

## 2015-07-09 DIAGNOSIS — W19XXXA Unspecified fall, initial encounter: Secondary | ICD-10-CM

## 2015-07-09 DIAGNOSIS — Y998 Other external cause status: Secondary | ICD-10-CM | POA: Insufficient documentation

## 2015-07-09 DIAGNOSIS — T1490XA Injury, unspecified, initial encounter: Secondary | ICD-10-CM

## 2015-07-09 DIAGNOSIS — M1990 Unspecified osteoarthritis, unspecified site: Secondary | ICD-10-CM | POA: Diagnosis not present

## 2015-07-09 DIAGNOSIS — F329 Major depressive disorder, single episode, unspecified: Secondary | ICD-10-CM | POA: Diagnosis not present

## 2015-07-09 MED ORDER — CEPHALEXIN 500 MG PO CAPS: 500.0000 mg | ORAL_CAPSULE | Freq: Four times a day (QID) | ORAL | Status: DC

## 2015-07-09 MED ORDER — HYDROCODONE-ACETAMINOPHEN 5-325 MG PO TABS
1.0000 | ORAL_TABLET | Freq: Once | ORAL | Status: AC
  Administered 2015-07-09: 1 via ORAL
  Filled 2015-07-09: qty 1

## 2015-07-09 MED ORDER — CEPHALEXIN 500 MG PO CAPS
500.0000 mg | ORAL_CAPSULE | Freq: Once | ORAL | Status: AC
  Administered 2015-07-09: 500 mg via ORAL
  Filled 2015-07-09: qty 1

## 2015-07-09 MED ORDER — HYDROCODONE-ACETAMINOPHEN 5-325 MG PO TABS: ORAL_TABLET | ORAL | Status: DC

## 2015-07-09 NOTE — ED Notes (Signed)
Pt requesting pain medication.  

## 2015-07-09 NOTE — ED Provider Notes (Signed)
CSN: [id number]     Arrival date & time 07/09/15  1654 History   First MD Initiated Contact with Patient 07/09/15 2235     Chief Complaint  Patient presents with  . Facial Laceration  . Dizziness     (Consider location/radiation/quality/duration/timing/severity/associated sxs/prior Treatment) HPI  Blood pressure 154/81, pulse 79, temperature 98 F (36.7 C), temperature source Oral, resp. rate 18, SpO2 100 %.  Dorothy White is a 64 y.o. female complaining of facial laceration and nausea with associated headache status post slip and fall early yesterday afternoon. Patient states that she fell over a fan forward and her head hit the wall. She is unsure if there was loss of consciousness. This fall was not witnessed. She denies anticoagulation, vomiting, change in vision, dysarthria, ataxia. On review of systems she endorses a mild cervicalgia. She has no pain medication taken prior to arrival. Last tetanus shot was in 2013.  Past Medical History  Diagnosis Date  . Arrhythmia   . Diverticulitis   . Depression   . Anxiety   . HLD (hyperlipidemia)   . Chronic back pain   . Stroke [facility])     "mini" stroke - 06/2013  . Headache(784.0)     Migraines  . GERD (gastroesophageal reflux disease)   . Arthritis   . Cancer ([place])     basal cell skin cancer  . Anemia   . Constipation    Past Surgical History  Procedure Laterality Date  . Tonsillectomy    . Abdominal hysterectomy    . Breast enhancement surgery    . Rhinoplasty    . Oophorectomy Bilateral   . Bunionectomy Bilateral   . Colonoscopy    . Orif humerus fracture Right 02/15/2015    Procedure: OPEN REDUCTION INTERNAL FIXATION (ORIF) PROXIMAL HUMERUS FRACTURE;  Surgeon: Netta Cedars, MD;  Location: [place];  Service: Orthopedics;  Laterality: Right;   Family History  Problem Relation Age of Onset  . Lung cancer Mother   . Heart attack Father   . Hypertension Brother   . Esophageal cancer Brother   . Colon cancer Neg Hx    . Diabetes Paternal Grandmother     questionable  . Kidney disease Neg Hx   . Liver disease Neg Hx    Social History  Substance Use Topics  . Smoking status: Former Smoker    Types: Cigarettes    Quit date: 06/30/2011  . Smokeless tobacco: Never Used  . Alcohol Use: 2.3 oz/week    3 Glasses of wine, 1 Standard drinks or equivalent per week     Comment: occasional   OB History    No data available     Review of Systems  10 systems reviewed and found to be negative, except as noted in the HPI.   Allergies  Penicillins  Home Medications   Prior to Admission medications   Medication Sig Start Date End Date Taking? Authorizing Provider  calcium-vitamin D (OSCAL WITH D) 500-200 MG-UNIT per tablet Take 1 tablet by mouth daily with breakfast.   Yes Historical Provider, MD  clonazePAM (KLONOPIN) 1 MG tablet Take 1 mg by mouth at bedtime as needed (anxiety/sleep.).  12/06/14  Yes Historical Provider, MD  FLUoxetine (PROZAC) 20 MG capsule Take 60 mg by mouth daily.    Yes Historical Provider, MD  montelukast (SINGULAIR) 10 MG tablet Take 10 mg by mouth daily.    Yes Historical Provider, MD  oxyCODONE-acetaminophen (PERCOCET) 10-325 MG tablet Take 1 tablet by mouth 4 (  four) times daily as needed. Pain. 06/15/15  Yes Historical Provider, MD  RELPAX 40 MG tablet as directed. 07/02/15  Yes Historical Provider, MD  tiZANidine (ZANAFLEX) 4 MG capsule Take 4 mg by mouth 3 (three) times daily as needed. Spasms. 06/25/15  Yes Historical Provider, MD  traZODone (DESYREL) 100 MG tablet Take 200 mg by mouth at bedtime.    Yes Historical Provider, MD  cephALEXin (KEFLEX) 500 MG capsule Take 1 capsule (500 mg total) by mouth 4 (four) times daily. 07/09/15   Holle Sprick, PA-C  HYDROcodone-acetaminophen (NORCO/VICODIN) 5-325 MG tablet Take 1-2 tablets by mouth every 6 hours as needed for pain and/or cough. 07/09/15   Lorilynn Lehr, PA-C  ibuprofen (ADVIL,MOTRIN) 400 MG tablet Take 1 tablet (400  mg total) by mouth every 6 (six) hours as needed. Patient not taking: Reported on 07/09/2015 12/19/14   Lacretia Leigh, MD  methocarbamol (ROBAXIN) 500 MG tablet Take 1 tablet (500 mg total) by mouth 3 (three) times daily as needed. Patient not taking: Reported on 07/09/2015 02/15/15   Netta Cedars, MD  oxyCODONE-acetaminophen (ROXICET) 5-325 MG per tablet Take 1-2 tablets by mouth every 4 (four) hours as needed for severe pain. Patient not taking: Reported on 07/09/2015 02/15/15   Netta Cedars, MD  SUMAtriptan (IMITREX) 25 MG tablet Take 2 tablets (50 mg total) by mouth once. May take 50mg  at onset of migraine and repeat once after 2 hours. Maximum daily dose 200mg  Patient not taking: Reported on 07/09/2015 03/31/14   Noland Fordyce, PA-C   BP 134/61 mmHg  Pulse 71  Temp(Src) 98 F (36.7 C) (Oral)  Resp 19  SpO2 99% Physical Exam  Constitutional: She is oriented to person, place, and time. She appears well-developed and well-nourished. No distress.  HENT:  Head: Normocephalic.    Mouth/Throat: Oropharynx is clear and moist.  4cm Full-thickness well-healing laceration to right forehead extending into the hairline. This is non-gaping, no active bleeding, no surrounding cellulitis.  Eyes: Conjunctivae and EOM are normal. Pupils are equal, round, and reactive to light.  No TTP of maxillary or frontal sinuses  No TTP or induration of temporal arteries bilaterally  Neck: Normal range of motion. Neck supple.  FROM to C-spine. Pt can touch chin to chest without discomfort. No TTP of midline cervical spine.   Cardiovascular: Normal rate, regular rhythm and intact distal pulses.   Pulmonary/Chest: Effort normal and breath sounds normal. No stridor. No respiratory distress. She has no wheezes. She has no rales. She exhibits no tenderness.  Abdominal: Soft. Bowel sounds are normal. There is no tenderness.  Musculoskeletal: Normal range of motion. She exhibits no edema or tenderness.  Neurological:  She is alert and oriented to person, place, and time. No cranial nerve deficit.  II-Visual fields grossly intact. III/IV/VI-Extraocular movements intact.  Pupils reactive bilaterally. V/VII-Smile symmetric, equal eyebrow raise,  facial sensation intact VIII- Hearing grossly intact IX/X-Normal gag XI-bilateral shoulder shrug XII-midline tongue extension Motor: 5/5 bilaterally with normal tone and bulk Cerebellar: Normal finger-to-nose  and normal heel-to-shin test.   Romberg negative Ambulates with a coordinated gait   Psychiatric: She has a normal mood and affect.  Nursing note and vitals reviewed.   ED Course  .Marland KitchenLaceration Repair Date/Time: 07/09/2015 11:54 PM Performed by: Monico Blitz Authorized by: Monico Blitz Consent: Verbal consent obtained. Consent given by: patient Patient identity confirmed: verbally with patient Body area: head/neck Location details: forehead Laceration length: 4 cm Foreign bodies: no foreign bodies Tendon involvement: none Nerve involvement: none Vascular  damage: no Patient sedated: no Irrigation solution: saline Irrigation method: syringe Amount of cleaning: standard Skin closure: Steri-Strips Approximation: close Approximation difficulty: simple Patient tolerance: Patient tolerated the procedure well with no immediate complications   (including critical care time) Labs Review Labs Reviewed - No data to display  Imaging Review Ct Head Wo Contrast  07/09/2015  CLINICAL DATA:  Laceration to the top of the forehead. Patient fell in struck a wall. Injury yesterday. No loss of consciousness. Now complains of dizziness and nausea. Headache. EXAM: CT HEAD WITHOUT CONTRAST CT CERVICAL SPINE WITHOUT CONTRAST TECHNIQUE: Multidetector CT imaging of the head and cervical spine was performed following the standard protocol without intravenous contrast. Multiplanar CT image reconstructions of the cervical spine were also generated. COMPARISON:   CT head 02/04/2015 FINDINGS: CT HEAD FINDINGS There is laceration and small subcutaneous scalp hematoma over the right anterior frontal region. No underlying bone abnormality. Mild diffuse cerebral atrophy. Patchy low-attenuation change in the deep white matter suggesting small vessel ischemia. No ventricular dilatation. No mass effect or midline shift. No abnormal extra-axial fluid collections. Gray-white matter junctions are distinct. Basal cisterns are not effaced. No evidence of acute intracranial hemorrhage. No depressed skull fractures. Visualized paranasal sinuses and mastoid air cells are not opacified. Old appearing nasal bone fractures. CT CERVICAL SPINE FINDINGS Normal alignment of the cervical spine. Degenerative changes with narrowed cervical interspaces and endplate hypertrophic changes most prominent at C5-6 and C6-7 levels. No vertebral compression deformities. No prevertebral soft tissue swelling. No focal bone lesion or bone destruction. Bone cortex and trabecular architecture appear intact. C1-2 articulation appears intact. Soft tissues are unremarkable. IMPRESSION: No acute intracranial abnormalities. Mild chronic atrophy and small vessel ischemic changes. Normal alignment of the cervical spine. Degenerative changes. No acute displaced fractures identified. Electronically Signed   By: Lucienne Capers M.D.   On: 07/09/2015 22:33   Ct Cervical Spine Wo Contrast  07/09/2015  CLINICAL DATA:  Laceration to the top of the forehead. Patient fell in struck a wall. Injury yesterday. No loss of consciousness. Now complains of dizziness and nausea. Headache. EXAM: CT HEAD WITHOUT CONTRAST CT CERVICAL SPINE WITHOUT CONTRAST TECHNIQUE: Multidetector CT imaging of the head and cervical spine was performed following the standard protocol without intravenous contrast. Multiplanar CT image reconstructions of the cervical spine were also generated. COMPARISON:  CT head 02/04/2015 FINDINGS: CT HEAD FINDINGS  There is laceration and small subcutaneous scalp hematoma over the right anterior frontal region. No underlying bone abnormality. Mild diffuse cerebral atrophy. Patchy low-attenuation change in the deep white matter suggesting small vessel ischemia. No ventricular dilatation. No mass effect or midline shift. No abnormal extra-axial fluid collections. Gray-white matter junctions are distinct. Basal cisterns are not effaced. No evidence of acute intracranial hemorrhage. No depressed skull fractures. Visualized paranasal sinuses and mastoid air cells are not opacified. Old appearing nasal bone fractures. CT CERVICAL SPINE FINDINGS Normal alignment of the cervical spine. Degenerative changes with narrowed cervical interspaces and endplate hypertrophic changes most prominent at C5-6 and C6-7 levels. No vertebral compression deformities. No prevertebral soft tissue swelling. No focal bone lesion or bone destruction. Bone cortex and trabecular architecture appear intact. C1-2 articulation appears intact. Soft tissues are unremarkable. IMPRESSION: No acute intracranial abnormalities. Mild chronic atrophy and small vessel ischemic changes. Normal alignment of the cervical spine. Degenerative changes. No acute displaced fractures identified. Electronically Signed   By: Lucienne Capers M.D.   On: 07/09/2015 22:33   I have personally reviewed and evaluated these images and  lab results as part of my medical decision-making.   EKG Interpretation None      MDM   Final diagnoses:  Fall at home, initial encounter  Concussion, without loss of consciousness, initial encounter  Facial laceration, initial encounter    Filed Vitals:   07/09/15 1702 07/09/15 2145 07/10/15 0005  BP: 159/81 154/81 134/61  Pulse: 88 79 71  Temp: 97.7 F (36.5 C) 98 F (36.7 C) 98 F (36.7 C)  TempSrc: Oral Oral Oral  Resp: 18 18 19   SpO2: 98% 100% 99%    Medications  HYDROcodone-acetaminophen (NORCO/VICODIN) 5-325 MG per  tablet 1 tablet (1 tablet Oral Given 07/09/15 2329)  cephALEXin (KEFLEX) capsule 500 mg (500 mg Oral Given 07/09/15 2329)    Dorothy White is 64 y.o. female presenting with headache and nausea and forehead laceration status post mechanical slip and fall last night. Neuro exam is nonfocal however, with a headache and nausea will CT. Here appears to be healing well, she is outside a 24-hour window, no indication for primary closure. Wound is cleaned and closed with Steri-Strips. We've had an extensive discussion of wound care and return precautions. Patient CT is negative, discussed concussion precautions. Patient will follow with her primary care physician.  Evaluation does not show pathology that would require ongoing emergent intervention or inpatient treatment. Pt is hemodynamically stable and mentating appropriately. Discussed findings and plan with patient/guardian, who agrees with care plan. All questions answered. Return precautions discussed and outpatient follow up given.   Discharge Medication List as of 07/10/2015 12:00 AM    START taking these medications   Details  cephALEXin (KEFLEX) 500 MG capsule Take 1 capsule (500 mg total) by mouth 4 (four) times daily., Starting 07/09/2015, Until Discontinued, Print    HYDROcodone-acetaminophen (NORCO/VICODIN) 5-325 MG tablet Take 1-2 tablets by mouth every 6 hours as needed for pain and/or cough., Print             Monico Blitz, PA-C 07/10/15 0139  Forde Dandy, MD 07/11/15 [phone]

## 2015-07-09 NOTE — ED Notes (Signed)
Pt to CT

## 2015-07-09 NOTE — ED Notes (Signed)
Pt presents with c/o laceration to the top of her forehead that travels into her hairline. Pt reports that she fell and hit the wall, cutting her forehead. Injury occurred yesterday. Pt reported no LOC after the fall but is c/o dizziness and nausea at this time.

## 2015-07-09 NOTE — ED Notes (Signed)
EDP at bedside  

## 2015-07-10 NOTE — Discharge Instructions (Signed)
Do not participate in any sports or any activities that could result in head trauma until you are cleared by your pediatrician,  primary care physician or neurologist.   Take vicodin for breakthrough pain, do not drink alcohol, drive, care for children or do other critical tasks while taking vicodin.  Please follow with your primary care doctor in the next 2 days for a check-up. They must obtain records for further management.   Do not hesitate to return to the Emergency Department for any new, worsening or concerning symptoms.    Concussion, Adult A concussion, or closed-head injury, is a brain injury caused by a direct blow to the head or by a quick and sudden movement (jolt) of the head or neck. Concussions are usually not life-threatening. Even so, the effects of a concussion can be serious. If you have had a concussion before, you are more likely to experience concussion-like symptoms after a direct blow to the head.  CAUSES  Direct blow to the head, such as from running into another player during a soccer game, being hit in a fight, or hitting your head on a hard surface.  A jolt of the head or neck that causes the brain to move back and forth inside the skull, such as in a car crash. SIGNS AND SYMPTOMS The signs of a concussion can be hard to notice. Early on, they may be missed by you, family members, and health care providers. You may look fine but act or feel differently. Symptoms are usually temporary, but they may last for days, weeks, or even longer. Some symptoms may appear right away while others may not show up for hours or days. Every head injury is different. Symptoms include:  Mild to moderate headaches that will not go away.  A feeling of pressure inside your head.  Having more trouble than usual:  Learning or remembering things you have heard.  Answering questions.  Paying attention or concentrating.  Organizing daily tasks.  Making decisions and solving  problems.  Slowness in thinking, acting or reacting, speaking, or reading.  Getting lost or being easily confused.  Feeling tired all the time or lacking energy (fatigued).  Feeling drowsy.  Sleep disturbances.  Sleeping more than usual.  Sleeping less than usual.  Trouble falling asleep.  Trouble sleeping (insomnia).  Loss of balance or feeling lightheaded or dizzy.  Nausea or vomiting.  Numbness or tingling.  Increased sensitivity to:  Sounds.  Lights.  Distractions.  Vision problems or eyes that tire easily.  Diminished sense of taste or smell.  Ringing in the ears.  Mood changes such as feeling sad or anxious.  Becoming easily irritated or angry for little or no reason.  Lack of motivation.  Seeing or hearing things other people do not see or hear (hallucinations). DIAGNOSIS Your health care provider can usually diagnose a concussion based on a description of your injury and symptoms. He or she will ask whether you passed out (lost consciousness) and whether you are having trouble remembering events that happened right before and during your injury. Your evaluation might include:  A brain scan to look for signs of injury to the brain. Even if the test shows no injury, you may still have a concussion.  Blood tests to be sure other problems are not present. TREATMENT  Concussions are usually treated in an emergency department, in urgent care, or at a clinic. You may need to stay in the hospital overnight for further treatment.  Tell your  health care provider if you are taking any medicines, including prescription medicines, over-the-counter medicines, and natural remedies. Some medicines, such as blood thinners (anticoagulants) and aspirin, may increase the chance of complications. Also tell your health care provider whether you have had alcohol or are taking illegal drugs. This information may affect treatment.  Your health care provider will send you  home with important instructions to follow.  How fast you will recover from a concussion depends on many factors. These factors include how severe your concussion is, what part of your brain was injured, your age, and how healthy you were before the concussion.  Most people with mild injuries recover fully. Recovery can take time. In general, recovery is slower in older persons. Also, persons who have had a concussion in the past or have other medical problems may find that it takes longer to recover from their current injury. HOME CARE INSTRUCTIONS General Instructions  Carefully follow the directions your health care provider gave you.  Only take over-the-counter or prescription medicines for pain, discomfort, or fever as directed by your health care provider.  Take only those medicines that your health care provider has approved.  Do not drink alcohol until your health care provider says you are well enough to do so. Alcohol and certain other drugs may slow your recovery and can put you at risk of further injury.  If it is harder than usual to remember things, write them down.  If you are easily distracted, try to do one thing at a time. For example, do not try to watch TV while fixing dinner.  Talk with family members or close friends when making important decisions.  Keep all follow-up appointments. Repeated evaluation of your symptoms is recommended for your recovery.  Watch your symptoms and tell others to do the same. Complications sometimes occur after a concussion. Older adults with a brain injury may have a higher risk of serious complications, such as a blood clot on the brain.  Tell your teachers, school nurse, school counselor, coach, athletic trainer, or work Freight forwarder about your injury, symptoms, and restrictions. Tell them about what you can or cannot do. They should watch for:  Increased problems with attention or concentration.  Increased difficulty remembering or  learning new information.  Increased time needed to complete tasks or assignments.  Increased irritability or decreased ability to cope with stress.  Increased symptoms.  Rest. Rest helps the brain to heal. Make sure you:  Get plenty of sleep at night. Avoid staying up late at night.  Keep the same bedtime hours on weekends and weekdays.  Rest during the day. Take daytime naps or rest breaks when you feel tired.  Limit activities that require a lot of thought or concentration. These include:  Doing homework or job-related work.  Watching TV.  Working on the computer.  Avoid any situation where there is potential for another head injury (football, hockey, soccer, basketball, martial arts, downhill snow sports and horseback riding). Your condition will get worse every time you experience a concussion. You should avoid these activities until you are evaluated by the appropriate follow-up health care providers. Returning To Your Regular Activities You will need to return to your normal activities slowly, not all at once. You must give your body and brain enough time for recovery.  Do not return to sports or other athletic activities until your health care provider tells you it is safe to do so.  Ask your health care provider when you can  drive, ride a bicycle, or operate heavy machinery. Your ability to react may be slower after a brain injury. Never do these activities if you are dizzy.  Ask your health care provider about when you can return to work or school. Preventing Another Concussion It is very important to avoid another brain injury, especially before you have recovered. In rare cases, another injury can lead to permanent brain damage, brain swelling, or death. The risk of this is greatest during the first 7-10 days after a head injury. Avoid injuries by:  Wearing a seat belt when riding in a car.  Drinking alcohol only in moderation.  Wearing a helmet when biking,  skiing, skateboarding, skating, or doing similar activities.  Avoiding activities that could lead to a second concussion, such as contact or recreational sports, until your health care provider says it is okay.  Taking safety measures in your home.  Remove clutter and tripping hazards from floors and stairways.  Use grab bars in bathrooms and handrails by stairs.  Place non-slip mats on floors and in bathtubs.  Improve lighting in dim areas. SEEK MEDICAL CARE IF:  You have increased problems paying attention or concentrating.  You have increased difficulty remembering or learning new information.  You need more time to complete tasks or assignments than before.  You have increased irritability or decreased ability to cope with stress.  You have more symptoms than before. Seek medical care if you have any of the following symptoms for more than 2 weeks after your injury:  Lasting (chronic) headaches.  Dizziness or balance problems.  Nausea.  Vision problems.  Increased sensitivity to noise or light.  Depression or mood swings.  Anxiety or irritability.  Memory problems.  Difficulty concentrating or paying attention.  Sleep problems.  Feeling tired all the time. SEEK IMMEDIATE MEDICAL CARE IF:  You have severe or worsening headaches. These may be a sign of a blood clot in the brain.  You have weakness (even if only in one hand, leg, or part of the face).  You have numbness.  You have decreased coordination.  You vomit repeatedly.  You have increased sleepiness.  One pupil is larger than the other.  You have convulsions.  You have slurred speech.  You have increased confusion. This may be a sign of a blood clot in the brain.  You have increased restlessness, agitation, or irritability.  You are unable to recognize people or places.  You have neck pain.  It is difficult to wake you up.  You have unusual behavior changes.  You lose  consciousness. MAKE SURE YOU:  Understand these instructions.  Will watch your condition.  Will get help right away if you are not doing well or get worse.   This information is not intended to replace advice given to you by your health care provider. Make sure you discuss any questions you have with your health care provider.   Document Released: 10/04/2003 Document Revised: 08/04/2014 Document Reviewed: 02/03/2013 Elsevier Interactive Patient Education [employer].

## 2015-08-02 IMAGING — CR DG CHEST 2V
2 series · 2 of 2 positions shown · non-contrast
Comparison: 04/11/2013

CLINICAL DATA: Pain of the left breast since [REDACTED]. Increasing
dizziness, confusion. History of smoking.

EXAM:
CHEST  2 VIEW

[w chest pa]
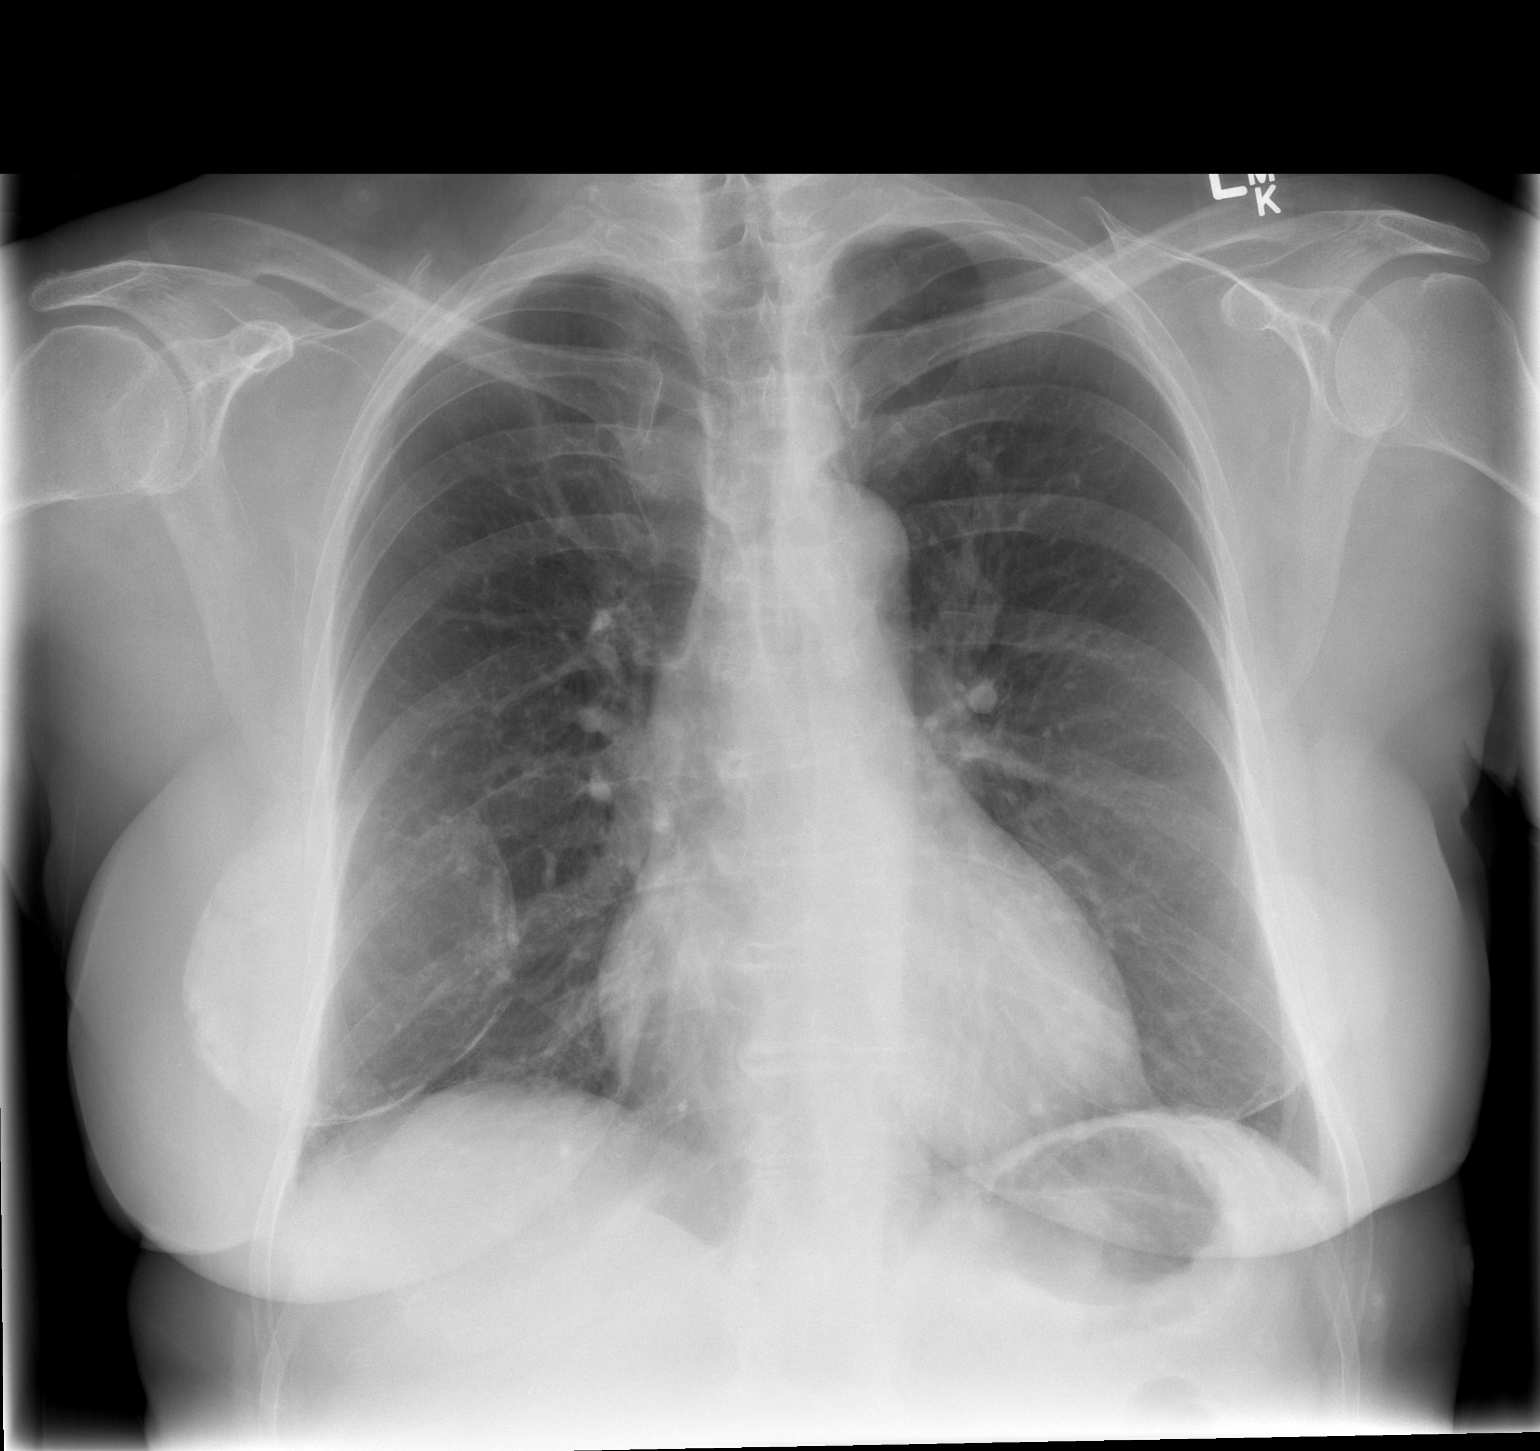

[w chest lat]
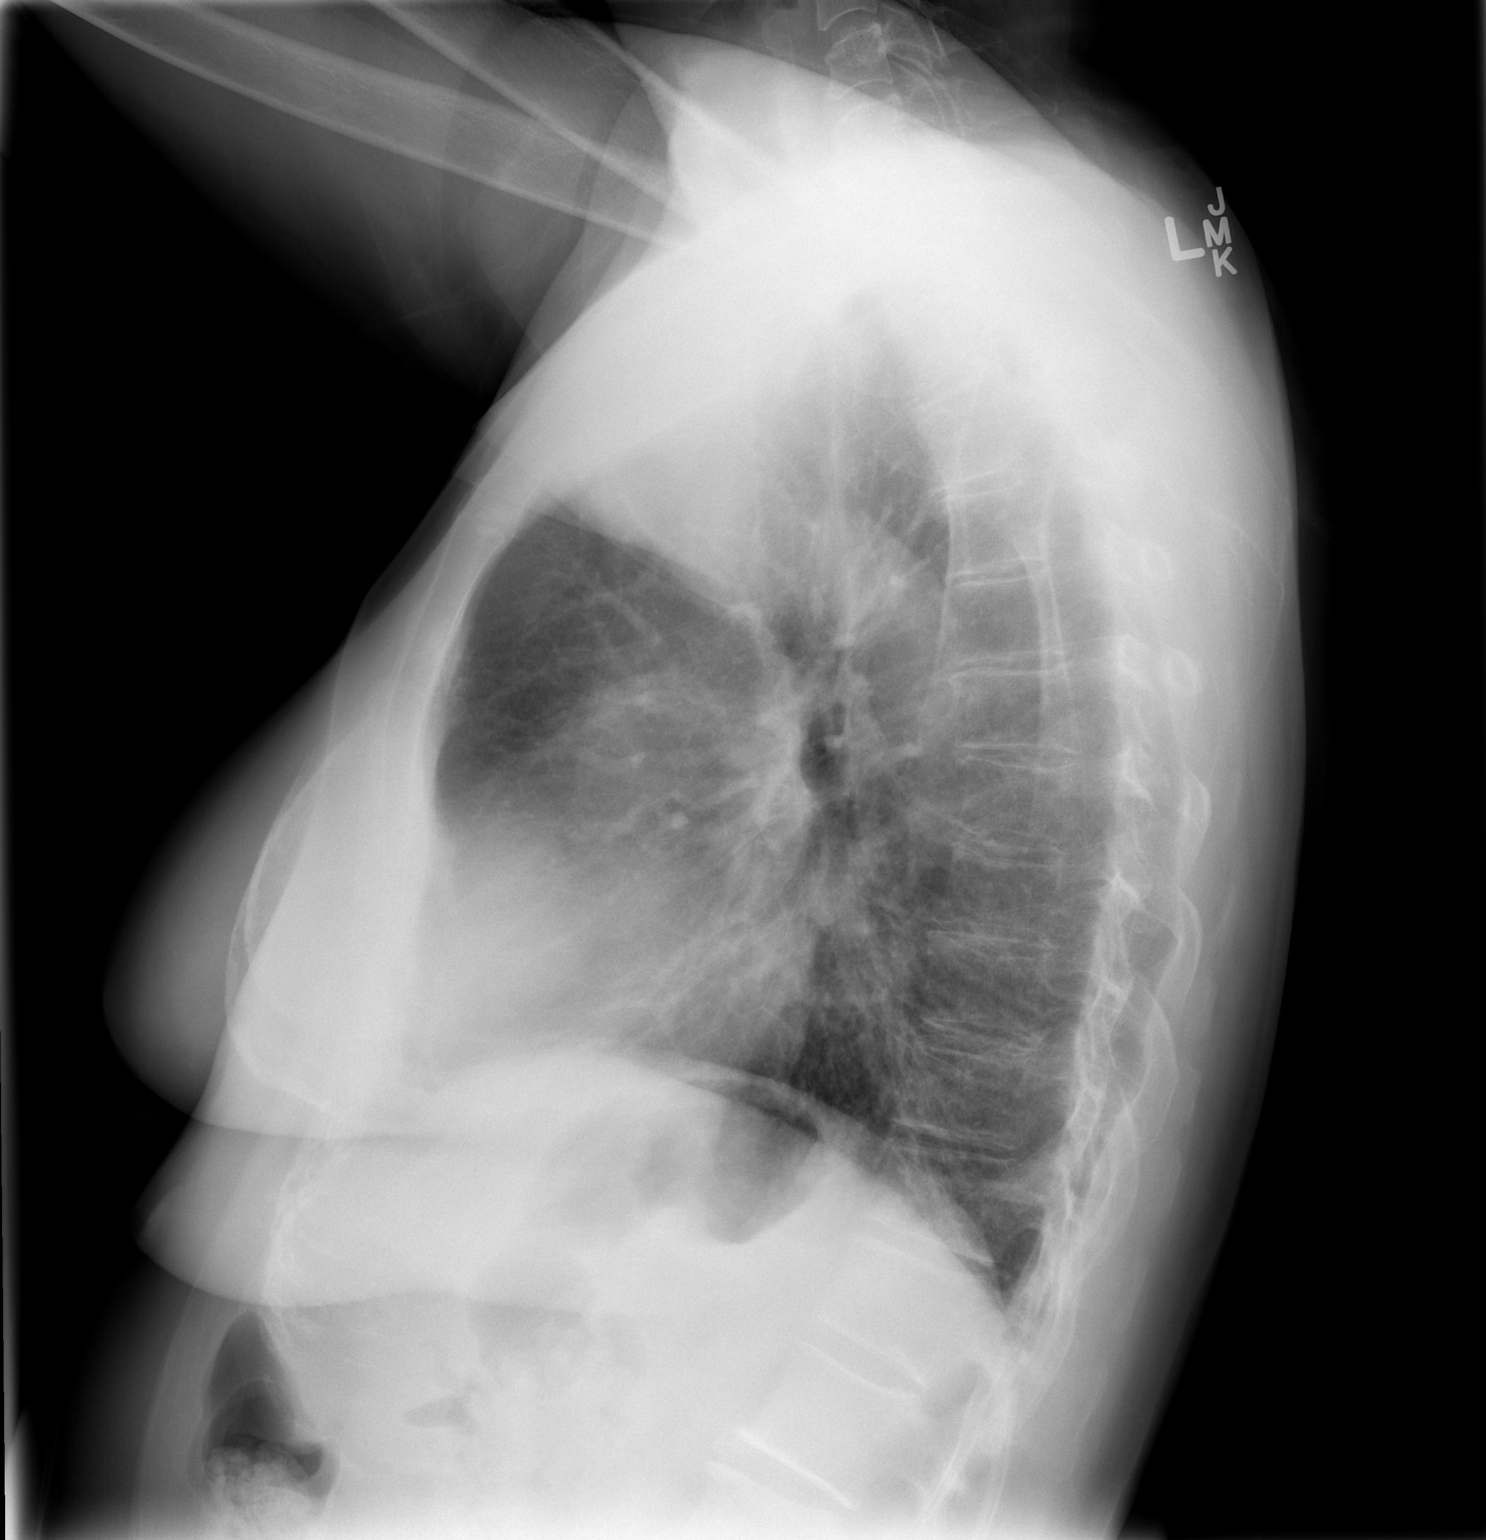

[2 of 2 positions shown; findings below may reference images not displayed]

FINDINGS: Heart size is normal. The lungs are free of focal consolidations and
pleural effusions. Visualized osseous structures have a normal
appearance. Bilateral breast implants are noted.
IMPRESSION: No active cardiopulmonary disease.

## 2015-08-02 IMAGING — CT CT HEAD W/O CM
1 series · 16 of 30 positions shown, 20 images · non-contrast
Comparison: 02/17/2012

CLINICAL DATA: Dizziness which began this morning. Left breast pain
for 1 month.

EXAM:
CT HEAD WITHOUT CONTRAST
TECHNIQUE: Contiguous axial images were obtained from the base of the skull
through the vertex without intravenous contrast.

[Series 2: head 5.0 h30s · axial · 0.40mm/px · z∈[-152,-17]mm · 16 of 31 slices shown, 20 images]
[im 2/31  brain]
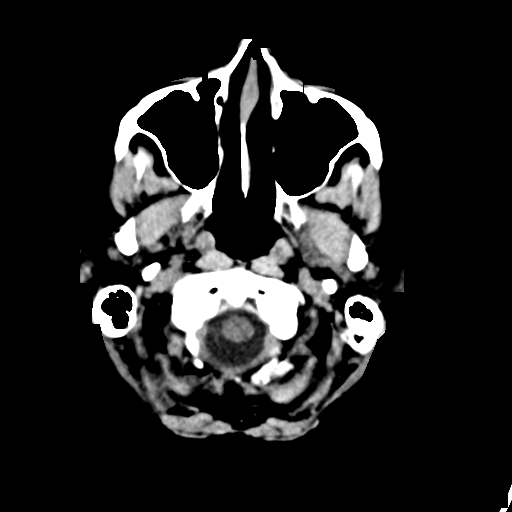
[im 2/31  bone]
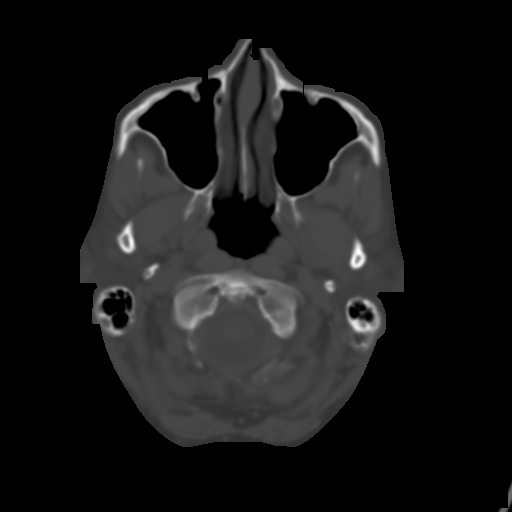
[im 4/31  brain]
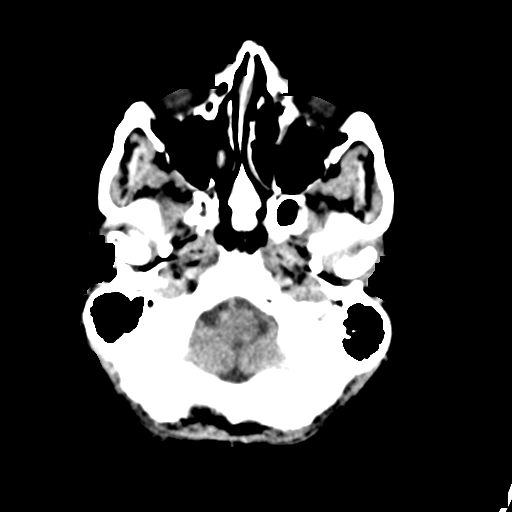
[im 6/31  brain]
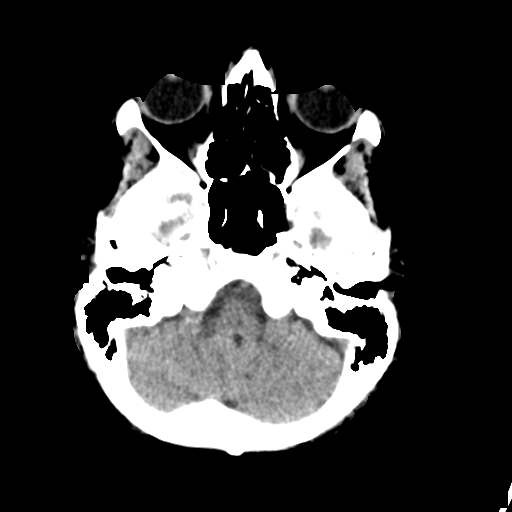
[im 8/31  brain]
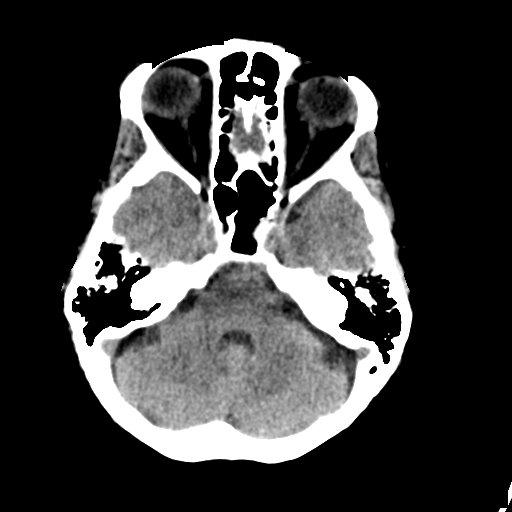
[im 9/31  brain]
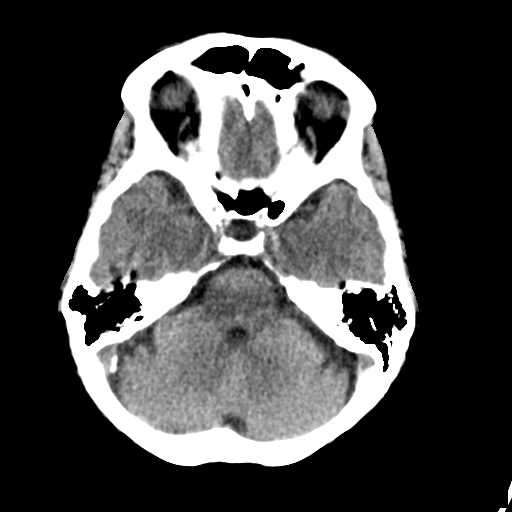
[im 9/31  bone]
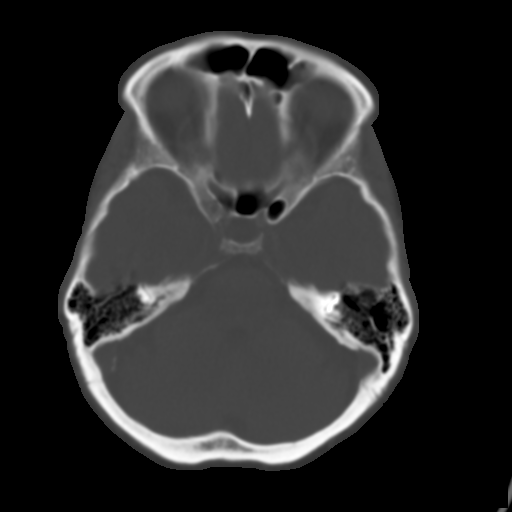
[im 11/31  brain]
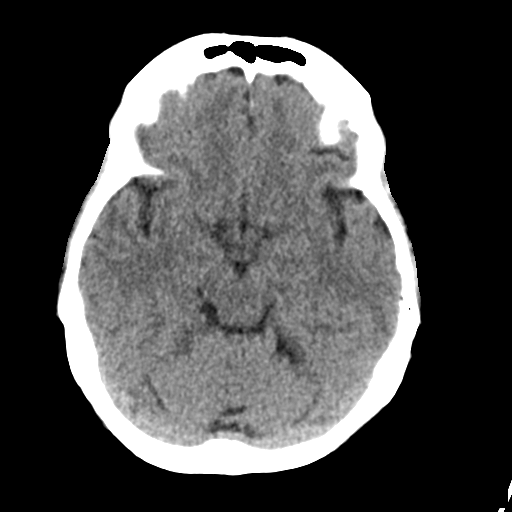
[im 13/31  brain]
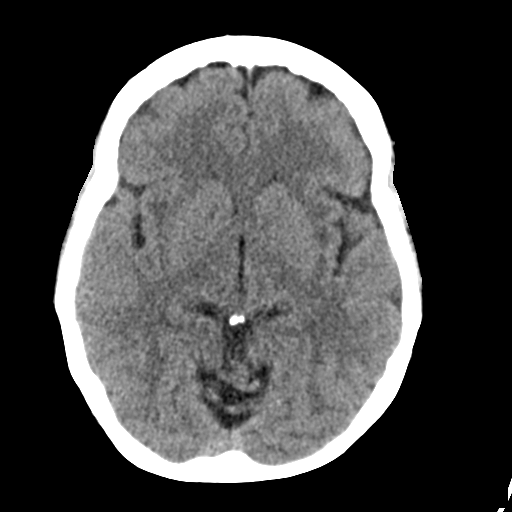
[im 15/31  brain]
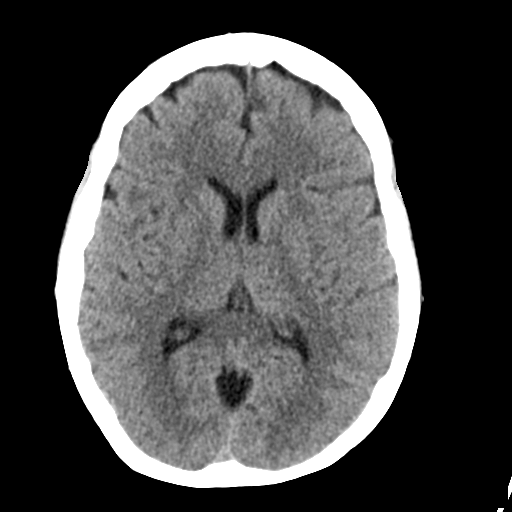
[im 16/31  brain]
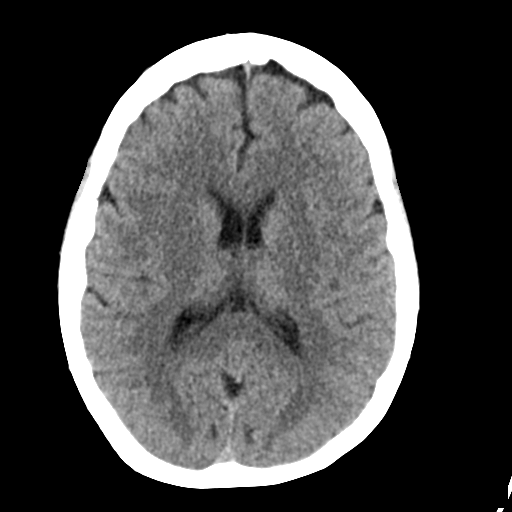
[im 16/31  bone]
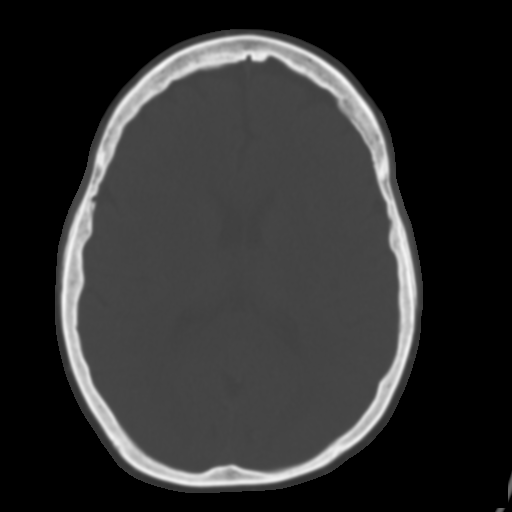
[im 18/31  brain]
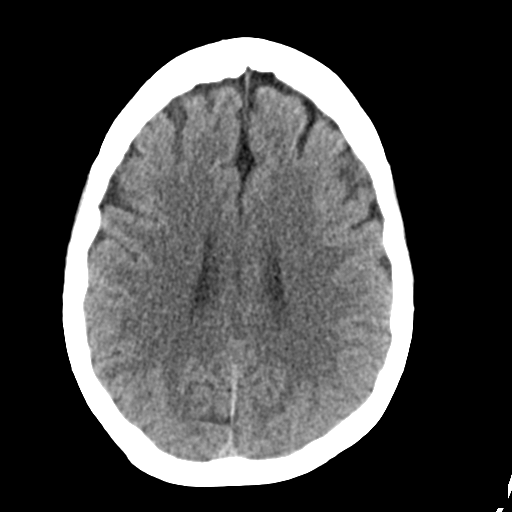
[im 20/31  brain]
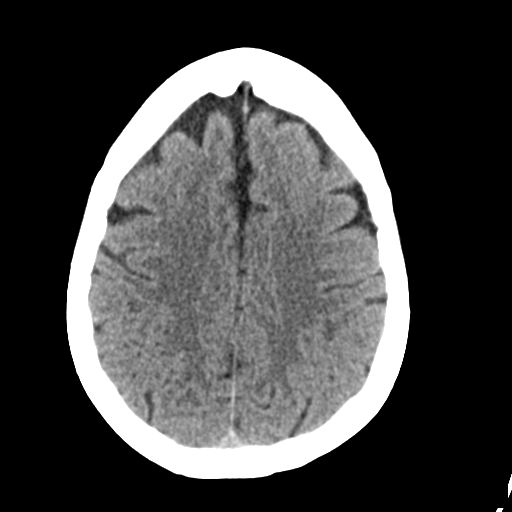
[im 22/31  brain]
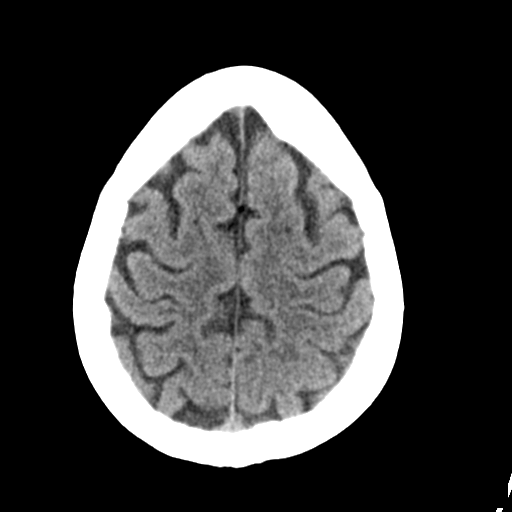
[im 23/31  brain]
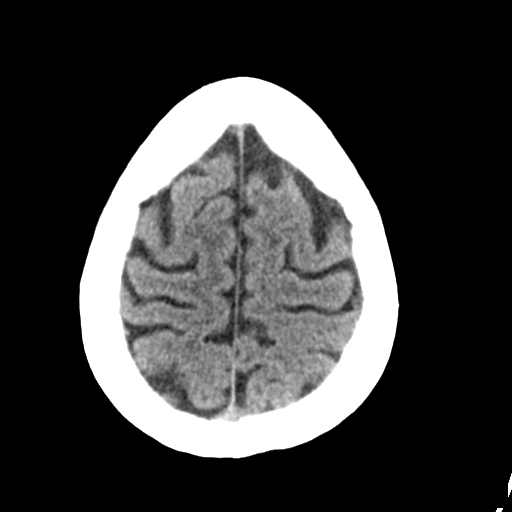
[im 23/31  bone]
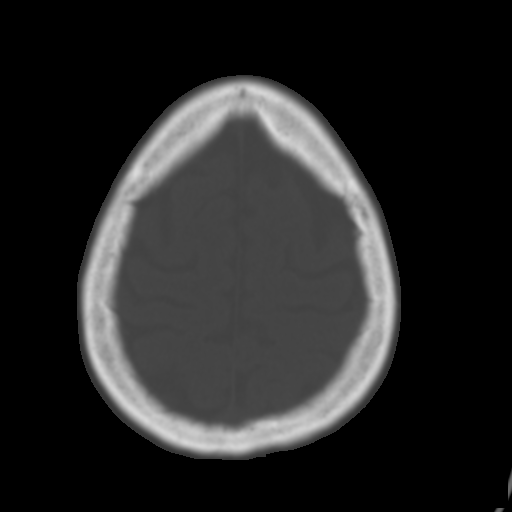
[im 25/31  brain]
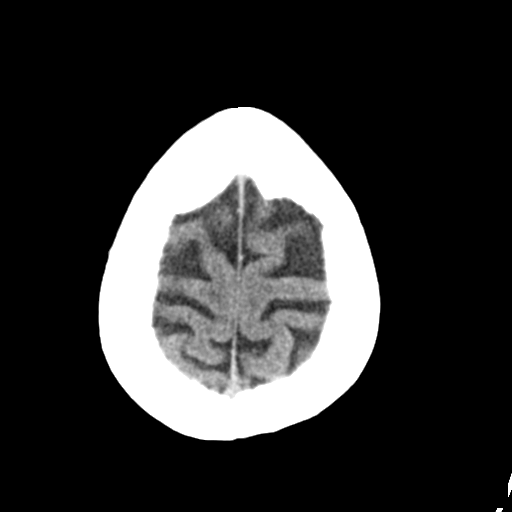
[im 27/31  brain]
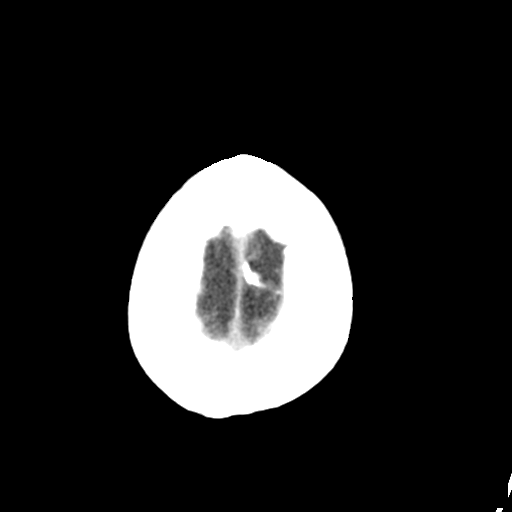
[im 29/31  brain]
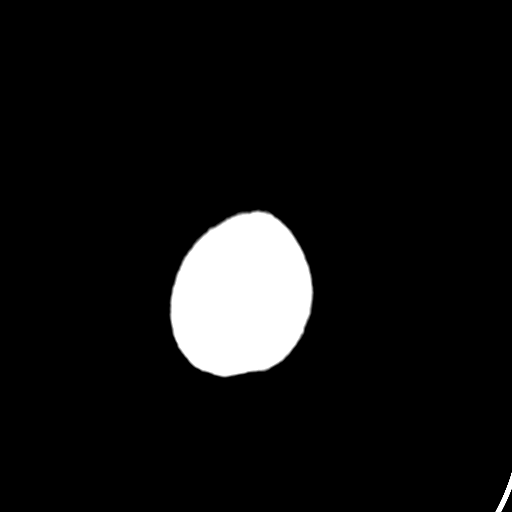

[16 of 30 positions shown; findings below may reference images not displayed]

FINDINGS: There is no intra or extra-axial fluid collection or mass lesion.
The basilar cisterns and ventricles have a normal appearance. There
is no CT evidence for acute infarction or hemorrhage. Bone windows
show no acute fractures.
IMPRESSION: No evidence for acute intracranial abnormality.

## 2015-09-25 ENCOUNTER — Encounter (HOSPITAL_COMMUNITY): Payer: Self-pay | Admitting: Emergency Medicine

## 2015-09-25 ENCOUNTER — Emergency Department (HOSPITAL_COMMUNITY): Payer: BLUE CROSS/BLUE SHIELD

## 2015-09-25 ENCOUNTER — Emergency Department (HOSPITAL_COMMUNITY)
Admission: EM | Admit: 2015-09-25 | Discharge: 2015-09-25 | Disposition: A | Discharge status: Home / Self Care | Payer: BLUE CROSS/BLUE SHIELD | Attending: Emergency Medicine | Admitting: Emergency Medicine

## 2015-09-25 DIAGNOSIS — F329 Major depressive disorder, single episode, unspecified: Secondary | ICD-10-CM | POA: Insufficient documentation

## 2015-09-25 DIAGNOSIS — Z880 Allergy status to penicillin: Secondary | ICD-10-CM | POA: Insufficient documentation

## 2015-09-25 DIAGNOSIS — Z85828 Personal history of other malignant neoplasm of skin: Secondary | ICD-10-CM | POA: Diagnosis not present

## 2015-09-25 DIAGNOSIS — R42 Dizziness and giddiness: Secondary | ICD-10-CM | POA: Diagnosis not present

## 2015-09-25 DIAGNOSIS — G8929 Other chronic pain: Secondary | ICD-10-CM | POA: Diagnosis not present

## 2015-09-25 DIAGNOSIS — M542 Cervicalgia: Secondary | ICD-10-CM | POA: Insufficient documentation

## 2015-09-25 DIAGNOSIS — Z87891 Personal history of nicotine dependence: Secondary | ICD-10-CM | POA: Insufficient documentation

## 2015-09-25 DIAGNOSIS — M1990 Unspecified osteoarthritis, unspecified site: Secondary | ICD-10-CM | POA: Insufficient documentation

## 2015-09-25 DIAGNOSIS — R110 Nausea: Secondary | ICD-10-CM | POA: Diagnosis not present

## 2015-09-25 DIAGNOSIS — R0789 Other chest pain: Secondary | ICD-10-CM | POA: Diagnosis not present

## 2015-09-25 DIAGNOSIS — R0602 Shortness of breath: Secondary | ICD-10-CM | POA: Insufficient documentation

## 2015-09-25 DIAGNOSIS — R51 Headache: Secondary | ICD-10-CM | POA: Diagnosis not present

## 2015-09-25 DIAGNOSIS — Z8639 Personal history of other endocrine, nutritional and metabolic disease: Secondary | ICD-10-CM | POA: Insufficient documentation

## 2015-09-25 DIAGNOSIS — Z8719 Personal history of other diseases of the digestive system: Secondary | ICD-10-CM | POA: Insufficient documentation

## 2015-09-25 DIAGNOSIS — Z79899 Other long term (current) drug therapy: Secondary | ICD-10-CM | POA: Insufficient documentation

## 2015-09-25 DIAGNOSIS — F419 Anxiety disorder, unspecified: Secondary | ICD-10-CM | POA: Diagnosis not present

## 2015-09-25 DIAGNOSIS — Z792 Long term (current) use of antibiotics: Secondary | ICD-10-CM | POA: Insufficient documentation

## 2015-09-25 DIAGNOSIS — Z8673 Personal history of transient ischemic attack (TIA), and cerebral infarction without residual deficits: Secondary | ICD-10-CM | POA: Diagnosis not present

## 2015-09-25 DIAGNOSIS — R079 Chest pain, unspecified: Secondary | ICD-10-CM | POA: Diagnosis present

## 2015-09-25 DIAGNOSIS — Z862 Personal history of diseases of the blood and blood-forming organs and certain disorders involving the immune mechanism: Secondary | ICD-10-CM | POA: Insufficient documentation

## 2015-09-25 DIAGNOSIS — Z8679 Personal history of other diseases of the circulatory system: Secondary | ICD-10-CM | POA: Insufficient documentation

## 2015-09-25 LAB — BASIC METABOLIC PANEL
ANION GAP: 10 (ref 5–15)
BUN: 13 mg/dL (ref 6–20)
CALCIUM: 9.7 mg/dL (ref 8.9–10.3)
CO2: 26 mmol/L (ref 22–32)
CREATININE: 0.81 mg/dL (ref 0.44–1.00)
Chloride: 106 mmol/L (ref 101–111)
Glucose, Bld: 91 mg/dL (ref 65–99)
Potassium: 4.4 mmol/L (ref 3.5–5.1)
SODIUM: 142 mmol/L (ref 135–145)

## 2015-09-25 LAB — CBC
HCT: 46.5 % — ABNORMAL HIGH (ref 36.0–46.0)
HEMOGLOBIN: 14.7 g/dL (ref 12.0–15.0)
MCH: 28.7 pg (ref 26.0–34.0)
MCHC: 31.6 g/dL (ref 30.0–36.0)
MCV: 90.6 fL (ref 78.0–100.0)
PLATELETS: 262 10*3/uL (ref 150–400)
RBC: 5.13 MIL/uL — AB (ref 3.87–5.11)
RDW: 13.7 % (ref 11.5–15.5)
WBC: 9.1 10*3/uL (ref 4.0–10.5)

## 2015-09-25 LAB — I-STAT TROPONIN, ED
TROPONIN I, POC: 0 ng/mL (ref 0.00–0.08)
TROPONIN I, POC: 0 ng/mL (ref 0.00–0.08)

## 2015-09-25 LAB — D-DIMER, QUANTITATIVE (NOT AT ARMC)

## 2015-09-25 MED ORDER — MORPHINE SULFATE (PF) 4 MG/ML IV SOLN
4.0000 mg | Freq: Once | INTRAVENOUS | Status: AC
  Administered 2015-09-25: 4 mg via INTRAVENOUS
  Filled 2015-09-25: qty 1

## 2015-09-25 MED ORDER — OXYCODONE-ACETAMINOPHEN 5-325 MG PO TABS
1.0000 | ORAL_TABLET | Freq: Once | ORAL | Status: AC
  Administered 2015-09-25: 1 via ORAL
  Filled 2015-09-25: qty 1

## 2015-09-25 MED ORDER — HYDROCODONE-ACETAMINOPHEN 5-325 MG PO TABS: ORAL_TABLET | ORAL | Status: DC

## 2015-09-25 MED ORDER — ASPIRIN 81 MG PO CHEW
324.0000 mg | CHEWABLE_TABLET | Freq: Once | ORAL | Status: AC
  Administered 2015-09-25: 324 mg via ORAL
  Filled 2015-09-25: qty 4

## 2015-09-25 MED ORDER — GI COCKTAIL ~~LOC~~
30.0000 mL | Freq: Once | ORAL | Status: AC
  Administered 2015-09-25: 30 mL via ORAL
  Filled 2015-09-25: qty 30

## 2015-09-25 NOTE — ED Provider Notes (Signed)
CSN: [id number]     Arrival date & time 09/25/15  1059 History   First MD Initiated Contact with Patient 09/25/15 1544     Chief Complaint  Patient presents with  . Chest Pain  . Shortness of Breath  . Nausea     (Consider location/radiation/quality/duration/timing/severity/associated sxs/prior Treatment) HPI   65 year old female with history of GERD, anxiety, depression, anemia who is a formal smoker presents with chest pain and shortness of breath. Patient reports yesterday afternoon she developed right-sided neck pain worsening with palpation and movement. Pain lasted for 4-6 hours and improving without any specific treatment. Today while resting and watching TV she developed gradual onset of midsternal chest pressure and tightness sensation with associated nausea, lightheadedness, headache, dizziness, and shortness of breath. Symptom has been waxing waning not brought on by exertion. She thought it was related to heartburn and did take some Alka-Seltzer without any improvement. She currently rates her discomfort as 4 out of 6 after receiving a Percocet in the waiting room while waiting today. She mentioned that the process has helped with her headache and neck pain. She admits that she has been having some stress after taking care of her friend who fell and broke her shoulder and hip. Past several months. She recently traveled back from [place] last week. She denies having any history of PE or DVT, no recent surgery, prolonged bed rest, unilateral leg swelling or calf pain, active cancer or hemoptysis. She is not on any oral hormone. She also mentioned no recent cardiac stress test or heart catheterization. No prior history of MI.  Past Medical History  Diagnosis Date  . Arrhythmia   . Diverticulitis   . Depression   . Anxiety   . HLD (hyperlipidemia)   . Chronic back pain   . Stroke [facility])     "mini" stroke - 06/2013  . Headache(784.0)     Migraines  . GERD (gastroesophageal reflux  disease)   . Arthritis   . Cancer ([place])     basal cell skin cancer  . Anemia   . Constipation    Past Surgical History  Procedure Laterality Date  . Tonsillectomy    . Abdominal hysterectomy    . Breast enhancement surgery    . Rhinoplasty    . Oophorectomy Bilateral   . Bunionectomy Bilateral   . Colonoscopy    . Orif humerus fracture Right 02/15/2015    Procedure: OPEN REDUCTION INTERNAL FIXATION (ORIF) PROXIMAL HUMERUS FRACTURE;  Surgeon: Netta Cedars, MD;  Location: [facility];  Service: Orthopedics;  Laterality: Right;   Family History  Problem Relation Age of Onset  . Lung cancer Mother   . Heart attack Father   . Hypertension Brother   . Esophageal cancer Brother   . Colon cancer Neg Hx   . Diabetes Paternal Grandmother     questionable  . Kidney disease Neg Hx   . Liver disease Neg Hx    Social History  Substance Use Topics  . Smoking status: Former Smoker    Types: Cigarettes    Quit date: 06/30/2011  . Smokeless tobacco: Never Used  . Alcohol Use: 2.3 oz/week    3 Glasses of wine, 1 Standard drinks or equivalent per week     Comment: occasional   OB History    No data available     Review of Systems  All other systems reviewed and are negative.     Allergies  Penicillins  Home Medications   Prior to Admission  medications   Medication Sig Start Date End Date Taking? Authorizing Provider  calcium-vitamin D (OSCAL WITH D) 500-200 MG-UNIT per tablet Take 1 tablet by mouth daily with breakfast.    Historical Provider, MD  cephALEXin (KEFLEX) 500 MG capsule Take 1 capsule (500 mg total) by mouth 4 (four) times daily. 07/09/15   Nicole Pisciotta, PA-C  clonazePAM (KLONOPIN) 1 MG tablet Take 1 mg by mouth at bedtime as needed (anxiety/sleep.).  12/06/14   Historical Provider, MD  FLUoxetine (PROZAC) 20 MG capsule Take 60 mg by mouth daily.     Historical Provider, MD  HYDROcodone-acetaminophen (NORCO/VICODIN) 5-325 MG tablet Take 1-2 tablets by mouth every 6  hours as needed for pain and/or cough. 07/09/15   Nicole Pisciotta, PA-C  ibuprofen (ADVIL,MOTRIN) 400 MG tablet Take 1 tablet (400 mg total) by mouth every 6 (six) hours as needed. Patient not taking: Reported on 07/09/2015 12/19/14   Lacretia Leigh, MD  methocarbamol (ROBAXIN) 500 MG tablet Take 1 tablet (500 mg total) by mouth 3 (three) times daily as needed. Patient not taking: Reported on 07/09/2015 02/15/15   Netta Cedars, MD  montelukast (SINGULAIR) 10 MG tablet Take 10 mg by mouth daily.     Historical Provider, MD  oxyCODONE-acetaminophen (PERCOCET) 10-325 MG tablet Take 1 tablet by mouth 4 (four) times daily as needed. Pain. 06/15/15   Historical Provider, MD  oxyCODONE-acetaminophen (ROXICET) 5-325 MG per tablet Take 1-2 tablets by mouth every 4 (four) hours as needed for severe pain. Patient not taking: Reported on 07/09/2015 02/15/15   Netta Cedars, MD  RELPAX 40 MG tablet as directed. 07/02/15   Historical Provider, MD  SUMAtriptan (IMITREX) 25 MG tablet Take 2 tablets (50 mg total) by mouth once. May take 50mg  at onset of migraine and repeat once after 2 hours. Maximum daily dose 200mg  Patient not taking: Reported on 07/09/2015 03/31/14   Noland Fordyce, PA-C  tiZANidine (ZANAFLEX) 4 MG capsule Take 4 mg by mouth 3 (three) times daily as needed. Spasms. 06/25/15   Historical Provider, MD  traZODone (DESYREL) 100 MG tablet Take 200 mg by mouth at bedtime.     Historical Provider, MD   BP 141/73 mmHg  Pulse 86  Temp(Src) 98.2 F (36.8 C) (Oral)  Resp 16  SpO2 96% Physical Exam  Constitutional: She appears well-developed and well-nourished. No distress.  HENT:  Head: Atraumatic.  Eyes: Conjunctivae are normal.  Neck: Neck supple.  Neurological: She is alert.  Skin: No rash noted.  Psychiatric: She has a normal mood and affect.  Nursing note and vitals reviewed.   ED Course  Procedures (including critical care time) Labs Review Labs Reviewed  CBC - Abnormal; Notable for the  following:    RBC 5.13 (*)    HCT 46.5 (*)    All other components within normal limits  BASIC METABOLIC PANEL  D-DIMER, QUANTITATIVE (NOT AT [facility])  I-STAT TROPOININ, ED  Randolm Idol, ED    Imaging Review Dg Chest 2 View  09/25/2015  CLINICAL DATA:  Chest pain and shortness of breath, RIGHT-sided neck pain last night and this morning moving into mid chest with increased shortness of breath, dizziness, and nausea, former smoker EXAM: CHEST  2 VIEW COMPARISON:  02/04/2015 FINDINGS: Normal heart size, mediastinal contours, and pulmonary vascularity. Lungs hyperinflated but clear. No infiltrate, pleural effusion or pneumothorax. Capsular calcification at BILATERAL breast prostheses. Bones demineralized with endplate spur formation thoracic spine and mild broad-based levoconvex scoliosis. Prior ORIF proximal RIGHT humerus. IMPRESSION: Probable COPD  changes. No acute abnormalities. Electronically Signed   By: Lavonia Dana M.D.   On: 09/25/2015 12:20   I have personally reviewed and evaluated these images and lab results as part of my medical decision-making.   EKG Interpretation None     ED ECG REPORT   Date: 09/25/2015  Rate: 71  Rhythm: normal sinus rhythm  QRS Axis: normal  Intervals: QT prolonged  ST/T Wave abnormalities: nonspecific T wave changes  Conduction Disutrbances:none  Narrative Interpretation:   Old EKG Reviewed: unchanged  I have personally reviewed the EKG tracing and agree with the computerized printout as noted.   MDM   Final diagnoses:  Atypical chest pain  Neck pain on right side    BP 136/67 mmHg  Pulse 61  Temp(Src) 98.2 F (36.8 C) (Oral)  Resp 18  SpO2 98%   4:12 PM Caucasian female here with midsternal chest pain. She has a HEART score of 3, which puts her at low risk of MACE.  This is non exertional chest pain. Due to recent travel history, a d-dimer has been ordered.  Pain medication given.  Will monitor closely.    6:39 PM Patient has  negative delta troponin which makes her chest and left likely to be ACS. Her d-dimer is negative, low suspicion for pulmonary embolism. She maintains adequate oxygenation with ambulation. Her labs are reassuring. Her chest x-ray shows probable COPD changes but no acute abnormalities. Her EKG shows prolonged QT which is not new. There is nonspecific T wave changes without acute ischemic changes.  7:58 PM sxs improves with pain medication.  Pt has hx of chronic back pain and she requesting for pain medication for her headache.  She also request pain medication for her chest discomfort.  I will provide a short course of pain meds but encourage pt to f/u with her PCP for further management.  Pt voice understanding and agrees with plan.    Domenic Moras, PA-C 09/25/15 2000  Dorie Rank, MD 09/25/15 2006

## 2015-09-25 NOTE — Discharge Instructions (Signed)
Please follow up with your doctor for further evaluation of your chest discomfort. Take vicodin as needed for your pain.   Chest Pain Observation It is often hard to give a specific diagnosis for the cause of chest pain. Among other possibilities your symptoms might be caused by inadequate oxygen delivery to your heart (angina). Angina that is not treated or evaluated can lead to a heart attack (myocardial infarction) or death. Blood tests, electrocardiograms, and X-rays may have been done to help determine a possible cause of your chest pain. After evaluation and observation, your health care provider has determined that it is unlikely your pain was caused by an unstable condition that requires hospitalization. However, a full evaluation of your pain may need to be completed, with additional diagnostic testing as directed. It is very important to keep your follow-up appointments. Not keeping your follow-up appointments could result in permanent heart damage, disability, or death. If there is any problem keeping your follow-up appointments, you must call your health care provider. HOME CARE INSTRUCTIONS  Due to the slight chance that your pain could be angina, it is important to follow your health care provider's treatment plan and also maintain a healthy lifestyle:  Maintain or work toward achieving a healthy weight.  Stay physically active and exercise regularly.  Decrease your salt intake.  Eat a balanced, healthy diet. Talk to a dietitian to learn about heart-healthy foods.  Increase your fiber intake by including whole grains, vegetables, fruits, and nuts in your diet.  Avoid situations that cause stress, anger, or depression.  Take medicines as advised by your health care provider. Report any side effects to your health care provider. Do not stop medicines or adjust the dosages on your own.  Quit smoking. Do not use nicotine patches or gum until you check with your health care  provider.  Keep your blood pressure, blood sugar, and cholesterol levels within normal limits.  Limit alcohol intake to no more than 1 drink per day for women who are not pregnant and 2 drinks per day for men.  Do not abuse drugs. SEEK IMMEDIATE MEDICAL CARE IF: You have severe chest pain or pressure which may include symptoms such as:  You feel pain or pressure in your arms, neck, jaw, or back.  You have severe back or abdominal pain, feel sick to your stomach (nauseous), or throw up (vomit).  You are sweating profusely.  You are having a fast or irregular heartbeat.  You feel short of breath while at rest.  You notice increasing shortness of breath during rest, sleep, or with activity.  You have chest pain that does not get better after rest or after taking your usual medicine.  You wake from sleep with chest pain.  You are unable to sleep because you cannot breathe.  You develop a frequent cough or you are coughing up blood.  You feel dizzy, faint, or experience extreme fatigue.  You develop severe weakness, dizziness, fainting, or chills. Any of these symptoms may represent a serious problem that is an emergency. Do not wait to see if the symptoms will go away. Call your local emergency services (911 in the [place] Do not drive yourself to the hospital. MAKE SURE YOU:  Understand these instructions.  Will watch your condition.  Will get help right away if you are not doing well or get worse.   This information is not intended to replace advice given to you by your health care provider. Make sure you discuss any  questions you have with your health care provider.   Document Released: 08/16/2010 Document Revised: 07/19/2013 Document Reviewed: 01/13/2013 Elsevier Interactive Patient Education [employer].

## 2015-09-25 NOTE — ED Notes (Signed)
Pain medication given in Triage. Patient advised about side effects of medications and  to avoid driving for a minimum of 4 hours.  

## 2015-09-25 NOTE — ED Notes (Signed)
Yesterday began having right sided neck pain, says this morning she woke up and the neck pain had spread down into the center of her chest. She believed she had indigestion, and took an [place] without relief. Also c/o SOB and nausea.

## 2015-09-25 NOTE — ED Notes (Signed)
Patient was a little unsteady ambulating with cane. Patient's pulse ox went down to 96%

## 2015-09-25 NOTE — ED Notes (Signed)
Nurse drawing labs. 

## 2015-09-25 NOTE — ED Notes (Signed)
UNABLE TO COLLECT LABS AT THIS TIME PATIENT IS IN XRAY

## 2015-09-25 NOTE — ED Notes (Signed)
ED PA at bedside

## 2015-09-27 IMAGING — CR DG ABDOMEN 1V
2 series · 2 of 2 positions shown · non-contrast
Comparison: US ABDOMEN COMPLETE dated 04/11/2013; DG LUMBAR SPINE
COMPLETE dated 10/21/2011

CLINICAL DATA: Left lower quadrant pain with constipation as well
as nausea

EXAM:
ABDOMEN - 1 VIEW

[t abdomen supine (1 of 2)]
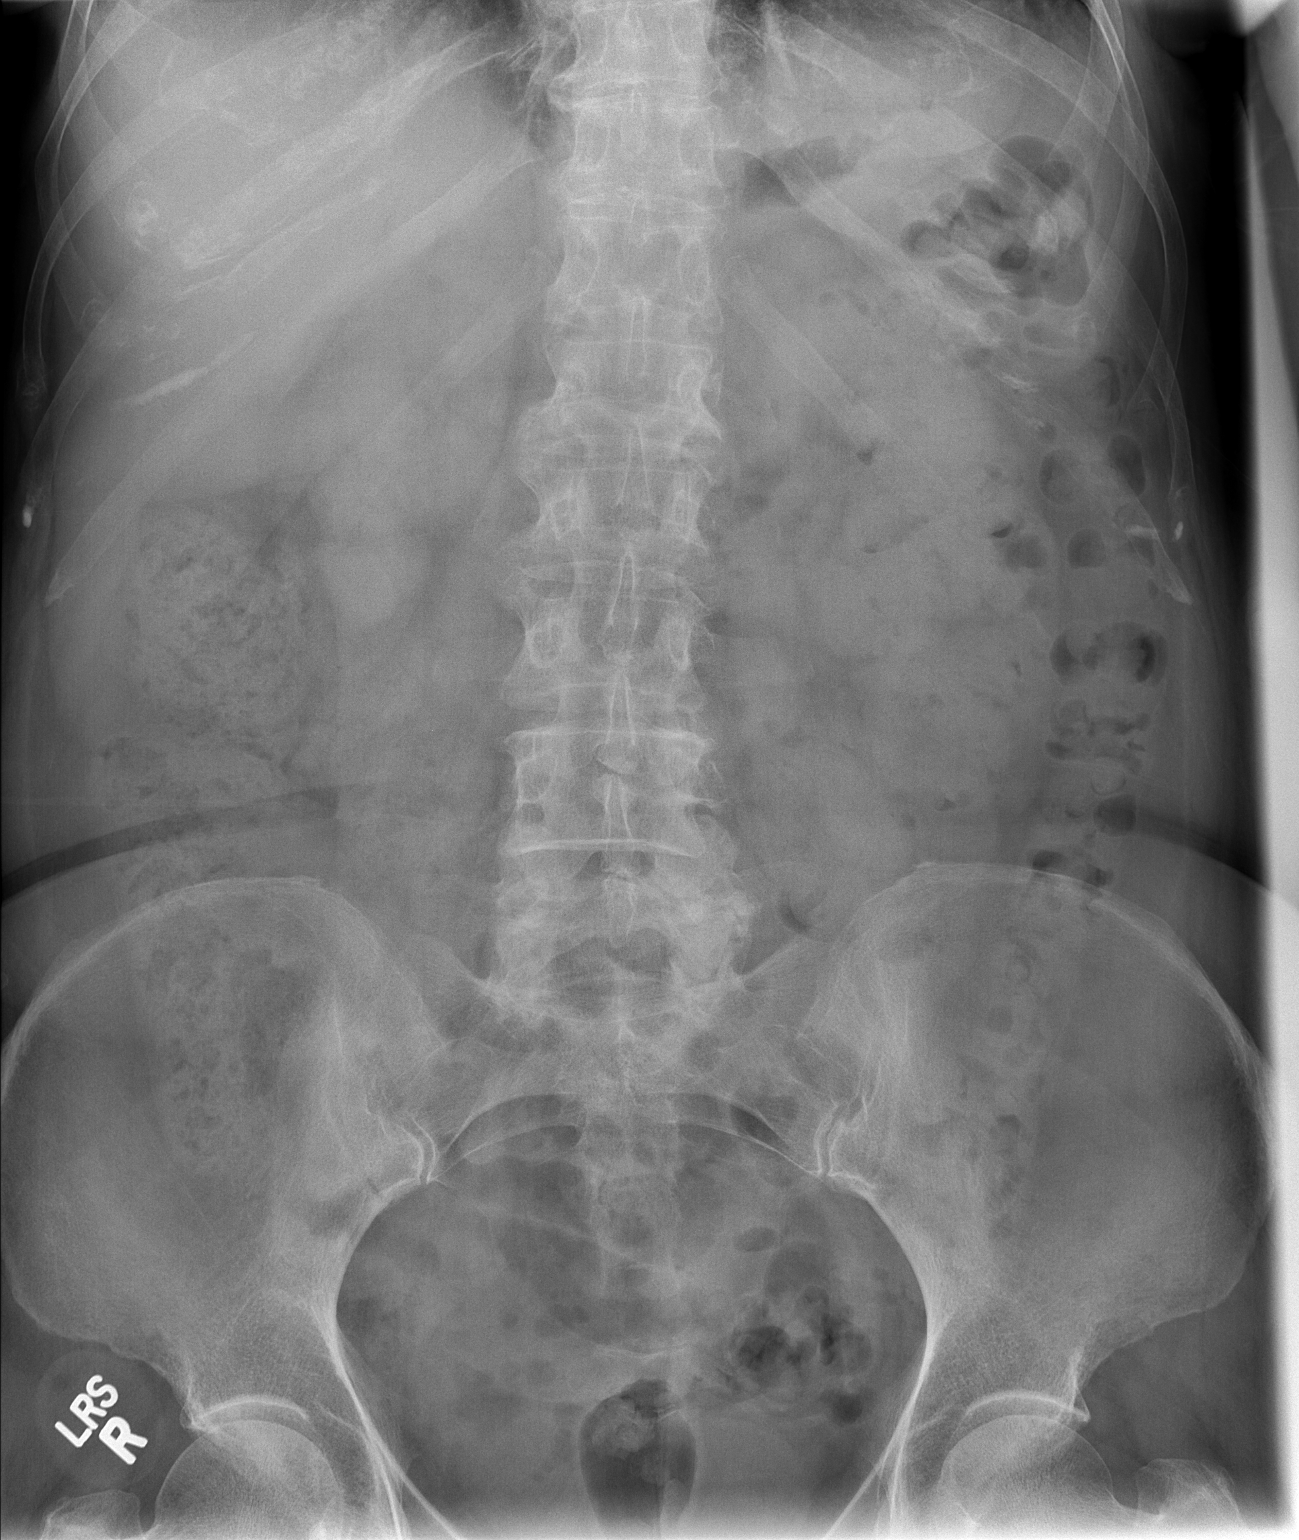

[t abdomen supine (2 of 2)]
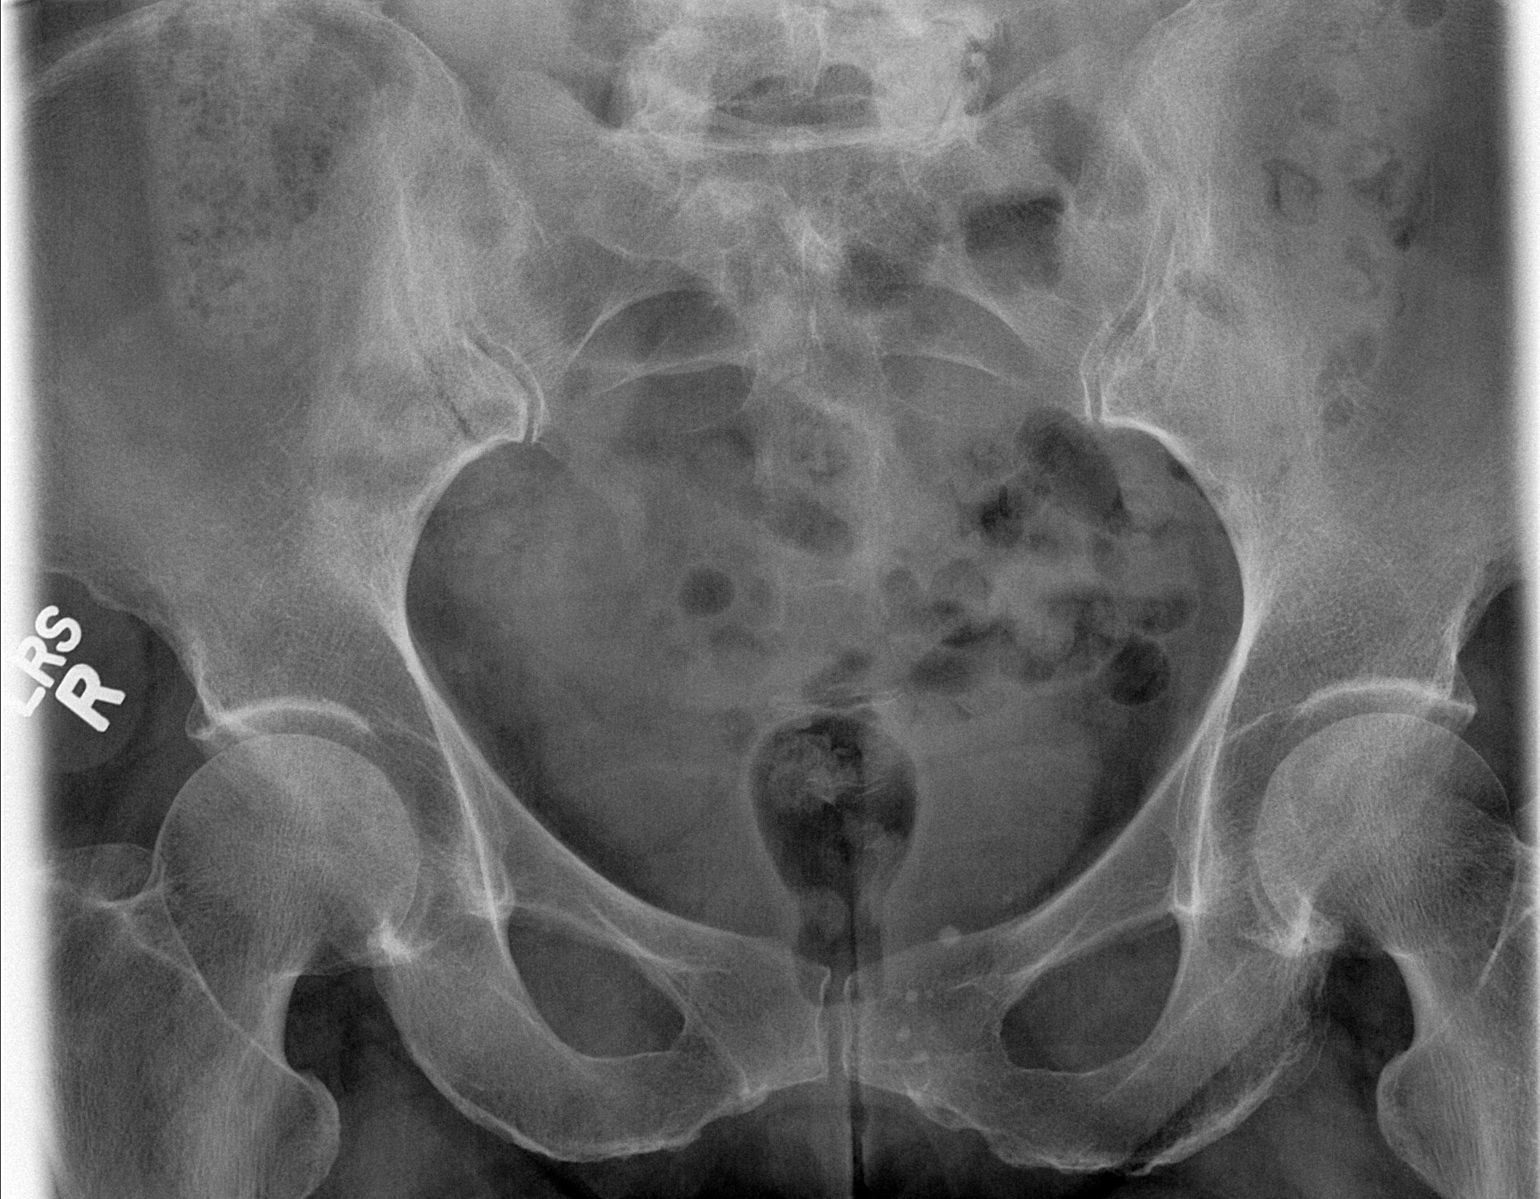

[2 of 2 positions shown; findings below may reference images not displayed]

FINDINGS: There is increased stool burden throughout the colon. There is no
evidence of a small or large bowel obstruction. No free gas
collections are demonstrated. No abnormal soft tissue calcifications
are demonstrated. There are phleboliths within the left aspect of
the lower pelvis. The lumbar spine exhibits mild degenerative
change.
IMPRESSION: 1. There is increased stool burden throughout the colon.
2. There is no evidence of a large or small bowel obstruction or
ileus.

## 2015-11-26 IMAGING — CT CT ABD-PELV W/ CM
1 of 3 series · 13 of 32 positions shown, 18 images · IV contrast (OMNIPAQUE 300)
Comparison: 07/21/2011

CLINICAL DATA: Lower pelvic pain for 2 days, nausea

EXAM:
CT ABDOMEN AND PELVIS WITH CONTRAST
TECHNIQUE: Multidetector CT imaging of the abdomen and pelvis was performed
using the standard protocol following bolus administration of
intravenous contrast.
CONTRAST:  100mL OMNIPAQUE IOHEXOL 300 MG/ML  SOLN

[Series 2: abd/pel with · axial · 0.74mm/px · z∈[+978,+1388]mm · 13 of 92 slices shown, 18 images]
[im 5/92  soft-tissue]
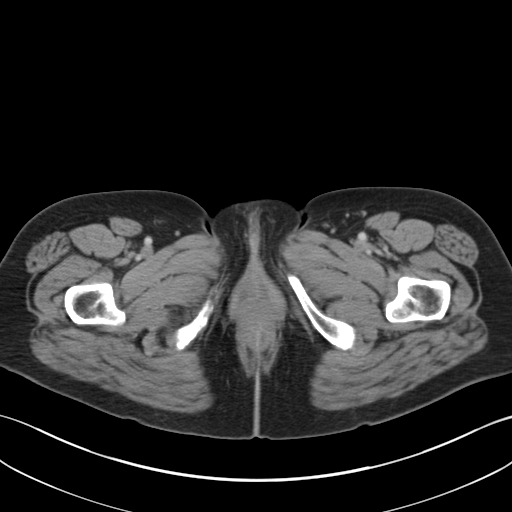
[im 5/92  bone]
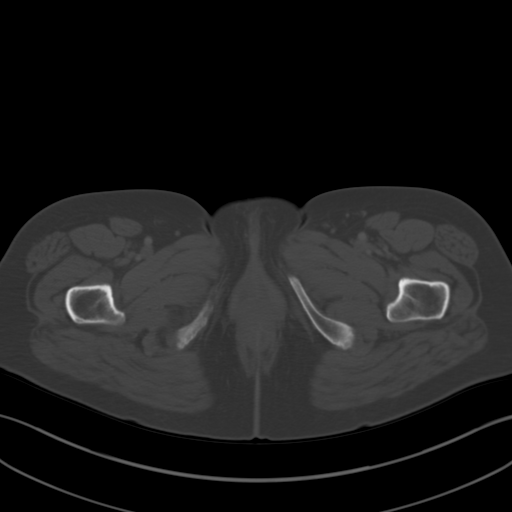
[im 15/92  soft-tissue]
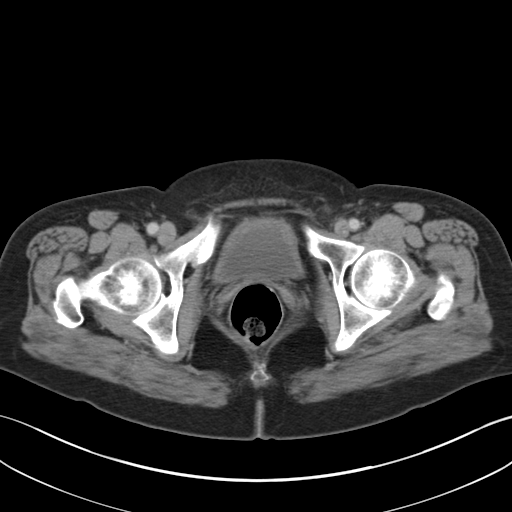
[im 20/92  soft-tissue]
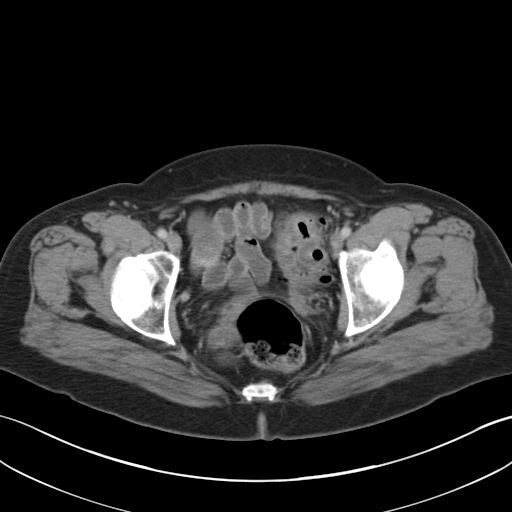
[im 29/92  soft-tissue]
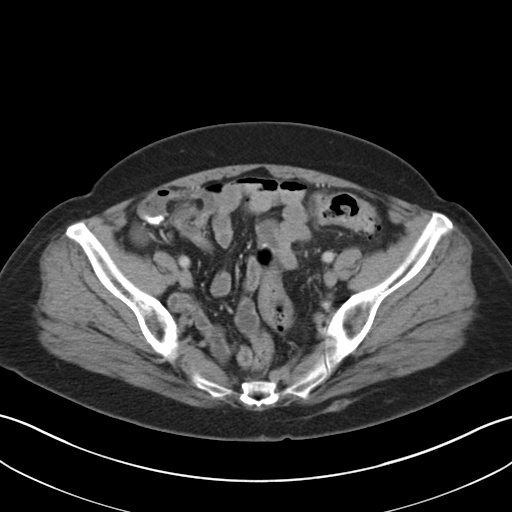
[im 34/92  soft-tissue]
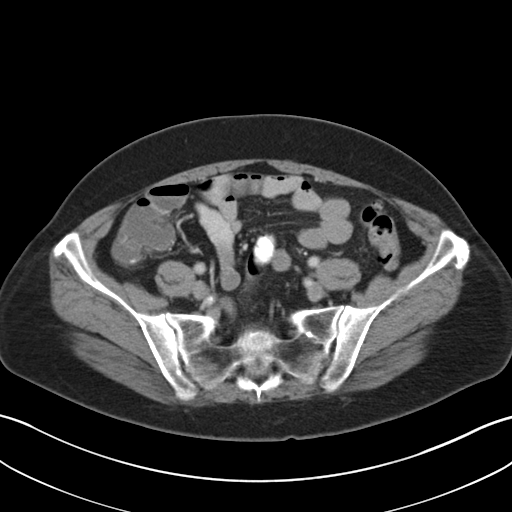
[im 44/92  soft-tissue]
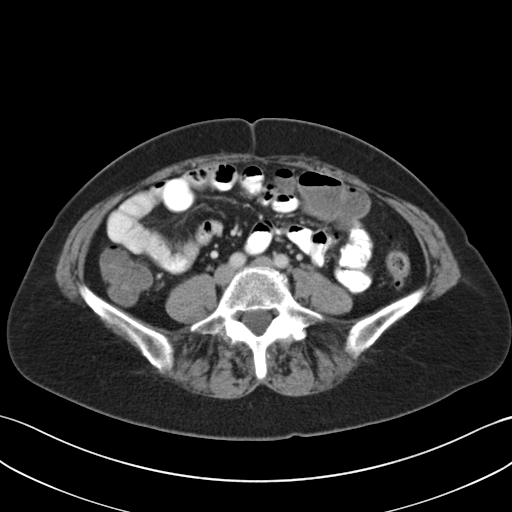
[im 48/92  soft-tissue]
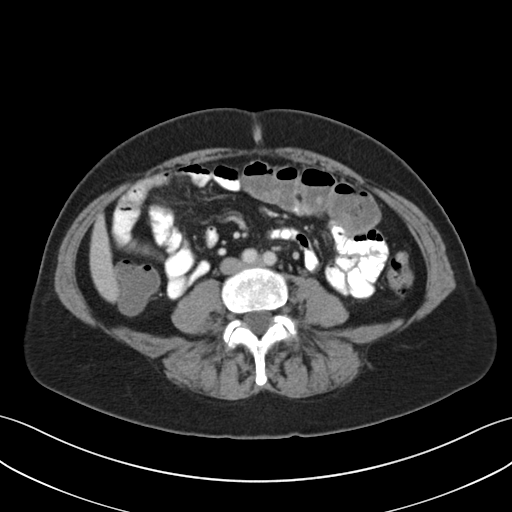
[im 58/92  soft-tissue]
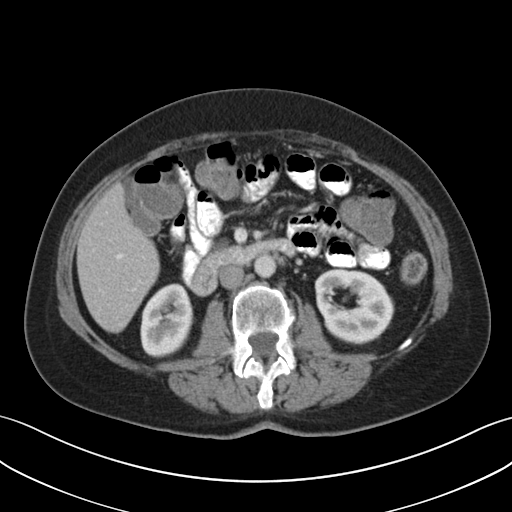
[im 63/92  soft-tissue]
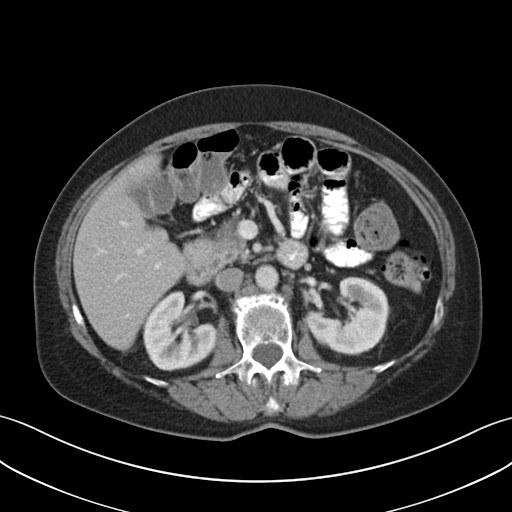
[im 63/92  bone]
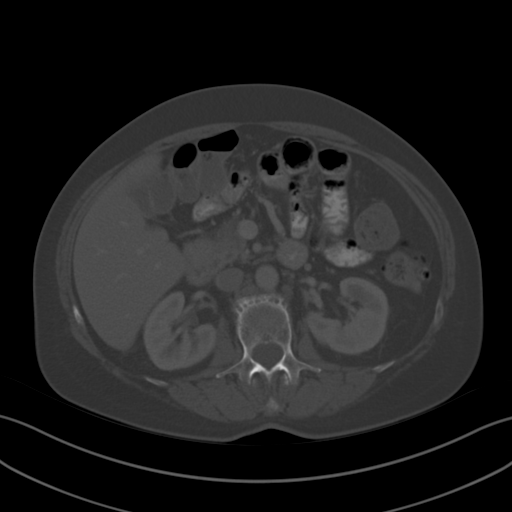
[im 72/92  soft-tissue]
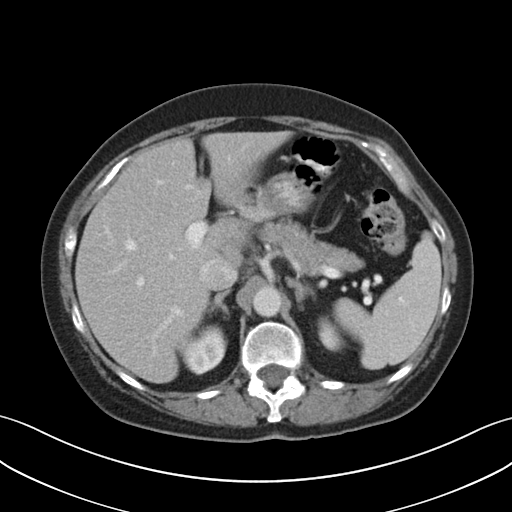
[im 72/92  lung]
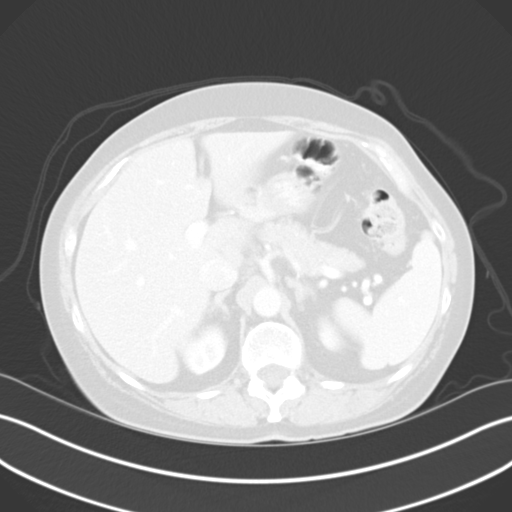
[im 77/92  soft-tissue]
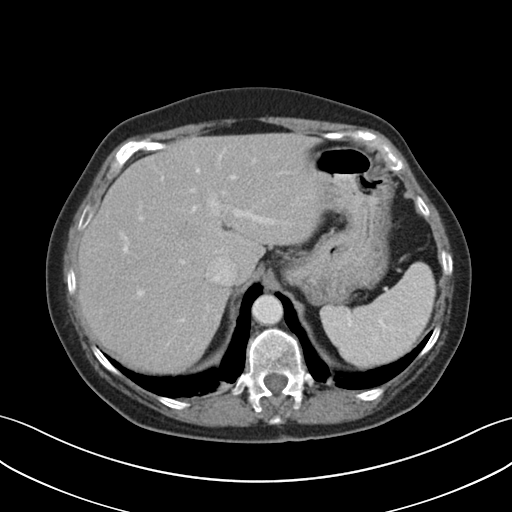
[im 77/92  lung]
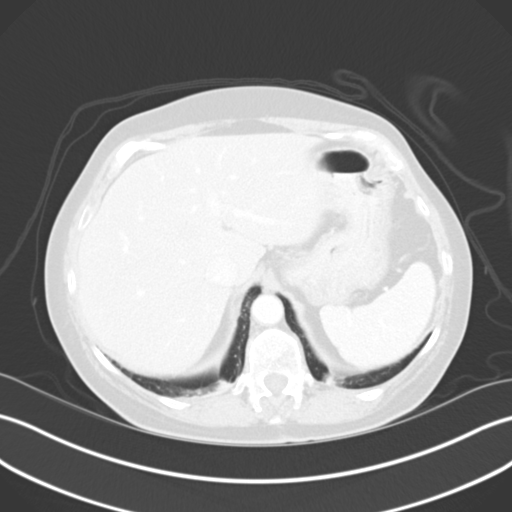
[im 82/92  lung]
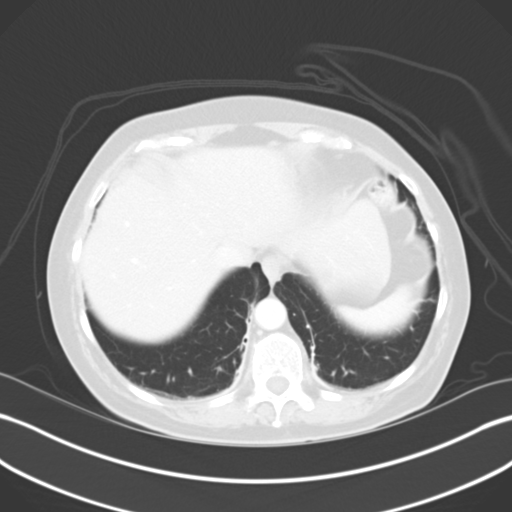
[im 87/92  soft-tissue]
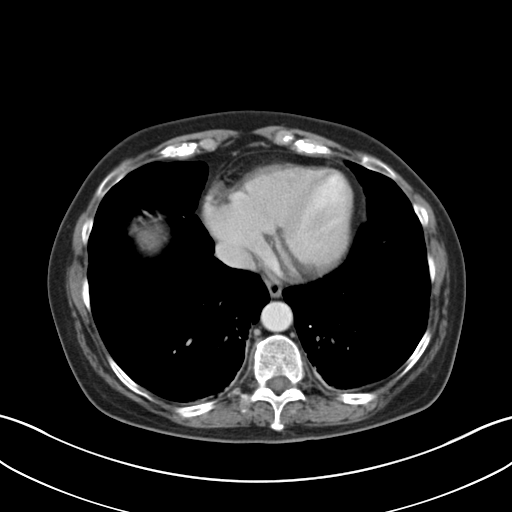
[im 87/92  lung]
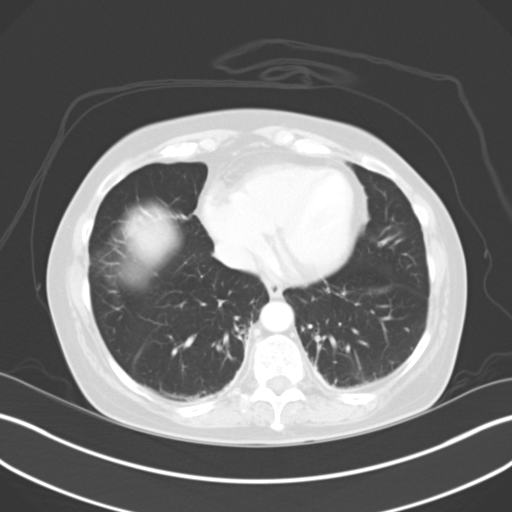

[13 of 32 positions shown; findings below may reference images not displayed]

FINDINGS: Sagittal images of the spine shows mild degenerative changes
thoracolumbar spine. Lung bases are unremarkable.

Liver pancreas, spleen and adrenals are unremarkable. Kidneys are
symmetrical in size and enhancement. No hydronephrosis or
hydroureter. Delayed renal images shows bilateral renal symmetrical
excretion.

No small bowel obstruction. No ascites or free air. No adenopathy.
There is no pericecal inflammation.

Left colon diverticula are noted. No evidence of acute
diverticulitis. Multiple sigmoid colon diverticula are noted.

In axial image 71 there is stranding of pericolonic fat and
thickening of the proximal sigmoid colon in left lower pelvis. This
is confirmed in coronal image 58. A inflamed diverticulum is noted
in coronal image 58 findings are consistent with acute
diverticulitis. No definite extraluminal contrast material. There is
concentric thickening of colonic wall at this level. Follow-up
colonoscopy after appropriate treatment and resolution of
diverticulitis is recommended to assure improvement.

The uterus is surgically absent.  Normal appendix.
IMPRESSION: 1. Multiple sigmoid colon diverticula are noted. There is focal
thickening of sigmoid colon wall and a inflamed diverticulum is
noted in proximal sigmoid colon left lower pelvis. Findings are
consistent with acute diverticulitis/focal colitis. There is
stranding of surrounding fat. No definite colonic perforation is
noted.
2. Normal appendix.  No pericecal inflammation.
3. Surgically absent uterus.
4. No hydronephrosis or hydroureter.
5. These results were called by telephone at the time of
interpretation on 10/28/2013 at [DATE] to Dr. SUELLEN, HELDER GOMES , who
verbally acknowledged these results.

## 2015-12-17 ENCOUNTER — Encounter (HOSPITAL_COMMUNITY): Payer: Self-pay | Admitting: Emergency Medicine

## 2015-12-17 ENCOUNTER — Emergency Department (HOSPITAL_COMMUNITY): Payer: BLUE CROSS/BLUE SHIELD

## 2015-12-17 ENCOUNTER — Observation Stay (HOSPITAL_COMMUNITY)
Admission: EM | Admit: 2015-12-17 | Discharge: 2015-12-21 | Disposition: A | Discharge status: Home / Self Care | Payer: BLUE CROSS/BLUE SHIELD | Attending: Internal Medicine | Admitting: Internal Medicine

## 2015-12-17 DIAGNOSIS — F17210 Nicotine dependence, cigarettes, uncomplicated: Secondary | ICD-10-CM | POA: Diagnosis not present

## 2015-12-17 DIAGNOSIS — K219 Gastro-esophageal reflux disease without esophagitis: Secondary | ICD-10-CM | POA: Insufficient documentation

## 2015-12-17 DIAGNOSIS — M4802 Spinal stenosis, cervical region: Secondary | ICD-10-CM | POA: Insufficient documentation

## 2015-12-17 DIAGNOSIS — Z880 Allergy status to penicillin: Secondary | ICD-10-CM | POA: Insufficient documentation

## 2015-12-17 DIAGNOSIS — R270 Ataxia, unspecified: Secondary | ICD-10-CM | POA: Diagnosis not present

## 2015-12-17 DIAGNOSIS — R531 Weakness: Secondary | ICD-10-CM | POA: Insufficient documentation

## 2015-12-17 DIAGNOSIS — E785 Hyperlipidemia, unspecified: Secondary | ICD-10-CM | POA: Diagnosis not present

## 2015-12-17 DIAGNOSIS — M2578 Osteophyte, vertebrae: Secondary | ICD-10-CM | POA: Diagnosis not present

## 2015-12-17 DIAGNOSIS — G8191 Hemiplegia, unspecified affecting right dominant side: Secondary | ICD-10-CM | POA: Diagnosis present

## 2015-12-17 DIAGNOSIS — R2681 Unsteadiness on feet: Secondary | ICD-10-CM

## 2015-12-17 DIAGNOSIS — Z8673 Personal history of transient ischemic attack (TIA), and cerebral infarction without residual deficits: Secondary | ICD-10-CM | POA: Diagnosis not present

## 2015-12-17 DIAGNOSIS — G8929 Other chronic pain: Secondary | ICD-10-CM | POA: Diagnosis not present

## 2015-12-17 DIAGNOSIS — R4781 Slurred speech: Secondary | ICD-10-CM | POA: Insufficient documentation

## 2015-12-17 DIAGNOSIS — M549 Dorsalgia, unspecified: Secondary | ICD-10-CM | POA: Insufficient documentation

## 2015-12-17 DIAGNOSIS — F419 Anxiety disorder, unspecified: Secondary | ICD-10-CM | POA: Insufficient documentation

## 2015-12-17 DIAGNOSIS — M8588 Other specified disorders of bone density and structure, other site: Secondary | ICD-10-CM | POA: Diagnosis not present

## 2015-12-17 DIAGNOSIS — M1990 Unspecified osteoarthritis, unspecified site: Secondary | ICD-10-CM | POA: Diagnosis not present

## 2015-12-17 DIAGNOSIS — I6523 Occlusion and stenosis of bilateral carotid arteries: Secondary | ICD-10-CM | POA: Diagnosis not present

## 2015-12-17 DIAGNOSIS — G43109 Migraine with aura, not intractable, without status migrainosus: Secondary | ICD-10-CM | POA: Diagnosis not present

## 2015-12-17 DIAGNOSIS — Z85828 Personal history of other malignant neoplasm of skin: Secondary | ICD-10-CM | POA: Insufficient documentation

## 2015-12-17 DIAGNOSIS — F329 Major depressive disorder, single episode, unspecified: Secondary | ICD-10-CM | POA: Diagnosis not present

## 2015-12-17 LAB — I-STAT CHEM 8, ED
BUN: 11 mg/dL (ref 6–20)
CALCIUM ION: 1.19 mmol/L (ref 1.13–1.30)
CHLORIDE: 104 mmol/L (ref 101–111)
Creatinine, Ser: 0.5 mg/dL (ref 0.44–1.00)
GLUCOSE: 96 mg/dL (ref 65–99)
HCT: 38 % (ref 36.0–46.0)
HEMOGLOBIN: 12.9 g/dL (ref 12.0–15.0)
Potassium: 3.5 mmol/L (ref 3.5–5.1)
SODIUM: 143 mmol/L (ref 135–145)
TCO2: 24 mmol/L (ref 0–100)

## 2015-12-17 LAB — CBC
HEMATOCRIT: 36.5 % (ref 36.0–46.0)
Hemoglobin: 11.9 g/dL — ABNORMAL LOW (ref 12.0–15.0)
MCH: 28.6 pg (ref 26.0–34.0)
MCHC: 32.6 g/dL (ref 30.0–36.0)
MCV: 87.7 fL (ref 78.0–100.0)
Platelets: 199 10*3/uL (ref 150–400)
RBC: 4.16 MIL/uL (ref 3.87–5.11)
RDW: 14.3 % (ref 11.5–15.5)
WBC: 6.5 10*3/uL (ref 4.0–10.5)

## 2015-12-17 LAB — COMPREHENSIVE METABOLIC PANEL
ALT: 18 U/L (ref 14–54)
AST: 18 U/L (ref 15–41)
Albumin: 3.5 g/dL (ref 3.5–5.0)
Alkaline Phosphatase: 85 U/L (ref 38–126)
Anion gap: 7 (ref 5–15)
BILIRUBIN TOTAL: 0.6 mg/dL (ref 0.3–1.2)
BUN: 12 mg/dL (ref 6–20)
CHLORIDE: 109 mmol/L (ref 101–111)
CO2: 23 mmol/L (ref 22–32)
CREATININE: 0.57 mg/dL (ref 0.44–1.00)
Calcium: 9 mg/dL (ref 8.9–10.3)
GFR calc Af Amer: >60 mL/min (ref >60)
Glucose, Bld: 98 mg/dL (ref 65–99)
Potassium: 3.5 mmol/L (ref 3.5–5.1)
Sodium: 139 mmol/L (ref 135–145)
Total Protein: 6 g/dL — ABNORMAL LOW (ref 6.5–8.1)

## 2015-12-17 LAB — DIFFERENTIAL
BASOS ABS: 0 10*3/uL (ref 0.0–0.1)
BASOS PCT: 1 %
Eosinophils Absolute: 0.6 10*3/uL (ref 0.0–0.7)
Eosinophils Relative: 8 %
LYMPHS ABS: 1.2 10*3/uL (ref 0.7–4.0)
LYMPHS PCT: 19 %
MONOS PCT: 11 %
Monocytes Absolute: 0.7 10*3/uL (ref 0.1–1.0)
NEUTROS ABS: 4 10*3/uL (ref 1.7–7.7)
Neutrophils Relative %: 61 %

## 2015-12-17 LAB — RAPID URINE DRUG SCREEN, HOSP PERFORMED
Amphetamines: NOT DETECTED
BARBITURATES: NOT DETECTED
BENZODIAZEPINES: NOT DETECTED
Cocaine: NOT DETECTED
Opiates: NOT DETECTED
Tetrahydrocannabinol: NOT DETECTED

## 2015-12-17 LAB — URINALYSIS, ROUTINE W REFLEX MICROSCOPIC
BILIRUBIN URINE: NEGATIVE
Glucose, UA: NEGATIVE mg/dL
HGB URINE DIPSTICK: NEGATIVE
KETONES UR: NEGATIVE mg/dL
Leukocytes, UA: NEGATIVE
Nitrite: NEGATIVE
PROTEIN: NEGATIVE mg/dL
Specific Gravity, Urine: 1.009 (ref 1.005–1.030)
pH: 6.5 (ref 5.0–8.0)

## 2015-12-17 LAB — I-STAT TROPONIN, ED: TROPONIN I, POC: 0 ng/mL (ref 0.00–0.08)

## 2015-12-17 LAB — PROTIME-INR
INR: 1.03 (ref 0.00–1.49)
PROTHROMBIN TIME: 13.3 s (ref 11.6–15.2)

## 2015-12-17 LAB — ETHANOL: Alcohol, Ethyl (B): <5 mg/dL (ref <5)

## 2015-12-17 LAB — APTT: aPTT: 23 seconds — ABNORMAL LOW (ref 24–37)

## 2015-12-17 MED ORDER — PROCHLORPERAZINE EDISYLATE 5 MG/ML IJ SOLN
10.0000 mg | Freq: Once | INTRAMUSCULAR | Status: AC
  Administered 2015-12-17: 10 mg via INTRAVENOUS
  Filled 2015-12-17: qty 2

## 2015-12-17 MED ORDER — DIPHENHYDRAMINE HCL 50 MG/ML IJ SOLN
25.0000 mg | Freq: Once | INTRAMUSCULAR | Status: AC
  Administered 2015-12-17: 25 mg via INTRAVENOUS
  Filled 2015-12-17: qty 1

## 2015-12-17 NOTE — H&P (Signed)
History and Physical    Dorothy White [id number] DOB: Jan 02, 1951 DOA: 12/17/2015  PCP: Jonathon Bellows, MD  Patient coming from: Home.  Chief Complaint: Difficulty walking.  HPI: Dorothy White is a 65 y.o. female with medical history significant of depression and migraine presents to the ER with increasing difficulty walking. Patient states she has been losing her balance when she tries to walk. Later the patient also started developing frontal headache which is not typical of her migraine. Patient also has closely. No facial asymmetry is able to move all extremities 5 x 5. Perla positive. Patient denies any new medications. Denies any nausea vomiting abdominal pain chest pain or shortness of breath. CT of the head is unremarkable. ER physician had discussed with on-call neurologist at [facility] Dr. Wallie Char who has advised patient to be transferred to [facility]. Patient is agreeable to transfer.  ED Course: CT head unremarkable.  Review of Systems: As per HPI otherwise 10 point review of systems negative.    Past Medical History  Diagnosis Date  . Arrhythmia   . Diverticulitis   . Depression   . Anxiety   . HLD (hyperlipidemia)   . Chronic back pain   . Stroke [facility])     "mini" stroke - 06/2013  . Headache(784.0)     Migraines  . GERD (gastroesophageal reflux disease)   . Arthritis   . Cancer ([place])     basal cell skin cancer  . Anemia   . Constipation     Past Surgical History  Procedure Laterality Date  . Tonsillectomy    . Abdominal hysterectomy    . Breast enhancement surgery    . Rhinoplasty    . Oophorectomy Bilateral   . Bunionectomy Bilateral   . Colonoscopy    . Orif humerus fracture Right 02/15/2015    Procedure: OPEN REDUCTION INTERNAL FIXATION (ORIF) PROXIMAL HUMERUS FRACTURE;  Surgeon: Netta Cedars, MD;  Location: [place];  Service: Orthopedics;  Laterality: Right;     reports that she has been smoking Cigarettes.  She has never used  smokeless tobacco. She reports that she does not drink alcohol or use illicit drugs.  Allergies - Penicillin.  Family History  Problem Relation Age of Onset  . Lung cancer Mother   . Heart attack Father   . Hypertension Brother   . Esophageal cancer Brother   . Colon cancer Neg Hx   . Diabetes Paternal Grandmother     questionable  . Kidney disease Neg Hx   . Liver disease Neg Hx     Prior to Admission medications   Medication Sig Start Date End Date Taking? Authorizing Provider  bismuth subsalicylate (PEPTO BISMOL) 262 MG chewable tablet Chew 524 mg by mouth as needed for indigestion.   Yes Historical Provider, MD  buPROPion (WELLBUTRIN XL) 300 MG 24 hr tablet Take 100 mg by mouth 2 (two) times daily.  09/13/15  Yes Historical Provider, MD  buPROPion (ZYBAN) 150 MG 12 hr tablet Take 1 tablet by mouth daily. 11/22/15  Yes Historical Provider, MD  calcium-vitamin D (OSCAL WITH D) 500-200 MG-UNIT per tablet Take 1 tablet by mouth daily with breakfast.   Yes Historical Provider, MD  clonazePAM (KLONOPIN) 1 MG tablet Take 1 mg by mouth at bedtime as needed (anxiety/sleep.).  12/06/14  Yes Historical Provider, MD  FLUoxetine (PROZAC) 20 MG capsule Take 60 mg by mouth daily.    Yes Historical Provider, MD  HYDROcodone-acetaminophen (NORCO/VICODIN) 5-325 MG tablet  Take 1-2 tablets by mouth every 6 hours as needed for pain and/or cough. 09/25/15  Yes Domenic Moras, PA-C  levocetirizine (XYZAL ALLERGY 24HR) 5 MG tablet Take 5 mg by mouth every evening.   Yes Historical Provider, MD  mometasone (NASONEX) 50 MCG/ACT nasal spray Place 2 sprays into the nose daily. 11/29/15  Yes Historical Provider, MD  montelukast (SINGULAIR) 10 MG tablet Take 10 mg by mouth daily.    Yes Historical Provider, MD  oxyCODONE-acetaminophen (PERCOCET) 10-325 MG tablet Take 1 tablet by mouth 4 (four) times daily as needed. Pain. 06/15/15  Yes Historical Provider, MD  RELPAX 40 MG tablet Take 40 mg by mouth every 2 (two) hours as  needed for migraine.  07/02/15  Yes Historical Provider, MD  traZODone (DESYREL) 100 MG tablet Take 200 mg by mouth at bedtime.    Yes Historical Provider, MD  cephALEXin (KEFLEX) 500 MG capsule Take 1 capsule (500 mg total) by mouth 4 (four) times daily. 07/09/15   Monico Blitz, PA-C    Physical Exam: Filed Vitals:   12/17/15 2056 12/17/15 2157 12/17/15 2223 12/17/15 2300  BP: 127/74  129/55 122/51  Pulse: 68  63 58  Temp: 98.5 F (36.9 C) 98.5 F (36.9 C)    TempSrc: Oral     Resp: 12  12 15   Height: 5\' 5"  (1.651 m)     Weight: 130 lb (58.968 kg)     SpO2: 97%  100% 96%      Constitutional: Not in distress. Filed Vitals:   12/17/15 2056 12/17/15 2157 12/17/15 2223 12/17/15 2300  BP: 127/74  129/55 122/51  Pulse: 68  63 58  Temp: 98.5 F (36.9 C) 98.5 F (36.9 C)    TempSrc: Oral     Resp: 12  12 15   Height: 5\' 5"  (1.651 m)     Weight: 130 lb (58.968 kg)     SpO2: 97%  100% 96%   Eyes: Anicteric no pallor. ENMT: No discharge from the ears eyes nose or mouth. Neck: No mass felt. No neck rigidity. Respiratory: No rhonchi or crepitations. Cardiovascular: S1-S2 heard. Abdomen: Soft nontender bowel sounds present. No guarding or rigidity. Musculoskeletal: No edema. Skin: No rash. Neurologic: Alert awake oriented to time place and person. Moves all extremities 5 x 5. No facial asymmetry. Tongue is midline. Psychiatric: Appears normal.   Labs on Admission: I have personally reviewed following labs and imaging studies  CBC:  Recent Labs Lab 12/17/15 2112 12/17/15 2122  WBC 6.5  --   NEUTROABS 4.0  --   HGB 11.9* 12.9  HCT 36.5 38.0  MCV 87.7  --   PLT 199  --    Basic Metabolic Panel:  Recent Labs Lab 12/17/15 2112 12/17/15 2122  NA 139 143  K 3.5 3.5  CL 109 104  CO2 23  --   GLUCOSE 98 96  BUN 12 11  CREATININE 0.57 0.50  CALCIUM 9.0  --    GFR: Estimated Creatinine Clearance: 63.9 mL/min (by C-G formula based on Cr of 0.5). Liver Function  Tests:  Recent Labs Lab 12/17/15 2112  AST 18  ALT 18  ALKPHOS 85  BILITOT 0.6  PROT 6.0*  ALBUMIN 3.5   No results for input(s): LIPASE, AMYLASE in the last 168 hours. No results for input(s): AMMONIA in the last 168 hours. Coagulation Profile:  Recent Labs Lab 12/17/15 2112  INR 1.03   Cardiac Enzymes: No results for input(s): CKTOTAL, CKMB, CKMBINDEX, TROPONINI in the  last 168 hours. BNP (last 3 results) No results for input(s): PROBNP in the last 8760 hours. HbA1C: No results for input(s): HGBA1C in the last 72 hours. CBG: No results for input(s): GLUCAP in the last 168 hours. Lipid Profile: No results for input(s): CHOL, HDL, LDLCALC, TRIG, CHOLHDL, LDLDIRECT in the last 72 hours. Thyroid Function Tests: No results for input(s): TSH, T4TOTAL, FREET4, T3FREE, THYROIDAB in the last 72 hours. Anemia Panel: No results for input(s): VITAMINB12, FOLATE, FERRITIN, TIBC, IRON, RETICCTPCT in the last 72 hours. Urine analysis:    Component Value Date/Time   COLORURINE YELLOW 12/17/2015 2218   APPEARANCEUR CLEAR 12/17/2015 2218   LABSPEC 1.009 12/17/2015 2218   PHURINE 6.5 12/17/2015 2218   GLUCOSEU NEGATIVE 12/17/2015 2218   GLUCOSEU NEGATIVE 04/13/2007 1417   HGBUR NEGATIVE 12/17/2015 2218   BILIRUBINUR NEGATIVE 12/17/2015 2218   KETONESUR NEGATIVE 12/17/2015 2218   PROTEINUR NEGATIVE 12/17/2015 2218   UROBILINOGEN 0.2 02/04/2015 1800   NITRITE NEGATIVE 12/17/2015 2218   LEUKOCYTESUR NEGATIVE 12/17/2015 2218   Sepsis Labs: @LABRCNTIP (procalcitonin:4,lacticidven:4) )No results found for this or any previous visit (from the past 240 hour(s)).   Radiological Exams on Admission: Ct Head Wo Contrast  12/17/2015  CLINICAL DATA:  Right-sided weakness.  Headache and dizziness. EXAM: CT HEAD WITHOUT CONTRAST TECHNIQUE: Contiguous axial images were obtained from the base of the skull through the vertex without intravenous contrast. COMPARISON:  Head CT 07/09/2015 FINDINGS:  No intracranial hemorrhage, mass effect, or midline shift. No hydrocephalus. The basilar cisterns are patent. No evidence of territorial infarct. Stable degree of chronic small vessel ischemia. No intracranial fluid collection. Calvarium is intact. Included paranasal sinuses and mastoid air cells are well aerated. IMPRESSION: No acute intracranial abnormality. Stable mild chronic small vessel ischemia. Electronically Signed   By: Jeb Levering M.D.   On: 12/17/2015 23:06    EKG: Independently reviewed. Normal sinus rhythm.  Assessment/Plan Principal Problem:   Ataxia Active Problems:   Right sided weakness    #1. Ataxia - at this time patient's symptoms are concerning for stroke for which MRI brain and MRA brain 2-D echo and carotid Doppler has been ordered. On-call neurologist Dr. Wallie Char was notified by the ER physician. Physical therapy consult. Aspirin. Will check ammonia and EEG since patient has periods of confusion. #2. Headache - patient does have a history of migraine but patient states the headache is different. Will check sedimentation rate for MRI brain. #3. History of depression - will continue present medications.  Dr. Norina Buzzard at [facility] will be the accepting physician.   DVT prophylaxis: Lovenox. Code Status: Full code.  Family Communication: No family at the bedside.  Disposition Plan: Home.  Consults called: Neurology was notified by the ER physician.  Admission status: Observation. Telemetry.    Rise Patience MD [facility] Pager [phone].  If 7PM-7AM, please contact night-coverage [email or web address] Password [facility]  12/17/2015, 11:26 PM

## 2015-12-17 NOTE — ED Notes (Signed)
Pt states she woke up this morning and did not feel right  Pt states her walking has not been right, she has been running into things and off balance today  Pt is c/o headache and dizziness  Pt states she is having a hard time concentrating on things and states her speech is not as good now as it was 2 hours ago  Pt states she normally walks fine and has no problems  Pt states she was fine last night

## 2015-12-17 NOTE — ED Provider Notes (Signed)
CSN: [id number]     Arrival date & time 12/17/15  2032 History   First MD Initiated Contact with Patient 12/17/15 2050     Chief Complaint  Patient presents with  . Weakness     (Consider location/radiation/quality/duration/timing/severity/associated sxs/prior Treatment) HPI   65 year old female with a history of prior CVA, hyperlipidemia presents with concern of right-sided weakness and difficulty speaking which began early this morning and has slowly worsened throughout the day. Reports she is otherwise in normal state of health, however when she woke this morning had right-sided weakness and difficulty walking. Symptoms are severe. Patient reports that she is falling towards the right. Reports some difficulty speaking, however it is not as prominent as when she had difficulties with prior CVA.  Slowly developing HA throughout the day. Hx of migraines however this feels different.  Past Medical History  Diagnosis Date  . Arrhythmia   . Diverticulitis   . Depression   . Anxiety   . HLD (hyperlipidemia)   . Chronic back pain   . Stroke [facility])     "mini" stroke - 06/2013  . Headache(784.0)     Migraines  . GERD (gastroesophageal reflux disease)   . Arthritis   . Cancer ([place])     basal cell skin cancer  . Anemia   . Constipation    Past Surgical History  Procedure Laterality Date  . Tonsillectomy    . Abdominal hysterectomy    . Breast enhancement surgery    . Rhinoplasty    . Oophorectomy Bilateral   . Bunionectomy Bilateral   . Colonoscopy    . Orif humerus fracture Right 02/15/2015    Procedure: OPEN REDUCTION INTERNAL FIXATION (ORIF) PROXIMAL HUMERUS FRACTURE;  Surgeon: Netta Cedars, MD;  Location: [place];  Service: Orthopedics;  Laterality: Right;   Family History  Problem Relation Age of Onset  . Lung cancer Mother   . Heart attack Father   . Hypertension Brother   . Esophageal cancer Brother   . Colon cancer Neg Hx   . Diabetes Paternal Grandmother    questionable  . Kidney disease Neg Hx   . Liver disease Neg Hx    Social History  Substance Use Topics  . Smoking status: Current Some Day Smoker    Types: Cigarettes    Last Attempt to Quit: 06/30/2011  . Smokeless tobacco: Never Used  . Alcohol Use: No     Comment: denies    OB History    No data available     Review of Systems  Constitutional: Negative for fever.  HENT: Negative for sore throat.   Eyes: Negative for visual disturbance.  Respiratory: Negative for cough and shortness of breath.   Cardiovascular: Negative for chest pain.  Gastrointestinal: Negative for abdominal pain.  Genitourinary: Negative for difficulty urinating.  Musculoskeletal: Negative for back pain and neck pain.  Skin: Negative for rash.  Neurological: Positive for weakness, numbness and headaches. Negative for syncope, facial asymmetry and light-headedness.      Allergies  Penicillins  Home Medications   Prior to Admission medications   Medication Sig Start Date End Date Taking? Authorizing Provider  bismuth subsalicylate (PEPTO BISMOL) 262 MG chewable tablet Chew 524 mg by mouth as needed for indigestion.   Yes Historical Provider, MD  buPROPion (WELLBUTRIN XL) 150 MG 24 hr tablet Take 150 mg by mouth daily.   Yes Historical Provider, MD  buPROPion (WELLBUTRIN XL) 300 MG 24 hr tablet Take 300 mg by mouth daily.  09/13/15  Yes Historical Provider, MD  calcium-vitamin D (OSCAL WITH D) 500-200 MG-UNIT per tablet Take 1 tablet by mouth daily with breakfast.   Yes Historical Provider, MD  clonazePAM (KLONOPIN) 1 MG tablet Take 1 mg by mouth at bedtime as needed (anxiety/sleep.).  12/06/14  Yes Historical Provider, MD  FLUoxetine (PROZAC) 20 MG capsule Take 60 mg by mouth daily.    Yes Historical Provider, MD  HYDROcodone-acetaminophen (NORCO/VICODIN) 5-325 MG tablet Take 1-2 tablets by mouth every 6 hours as needed for pain and/or cough. 09/25/15  Yes Domenic Moras, PA-C  levocetirizine (XYZAL ALLERGY  24HR) 5 MG tablet Take 5 mg by mouth every evening.   Yes Historical Provider, MD  mometasone (NASONEX) 50 MCG/ACT nasal spray Place 2 sprays into the nose daily. 11/29/15  Yes Historical Provider, MD  montelukast (SINGULAIR) 10 MG tablet Take 10 mg by mouth daily.    Yes Historical Provider, MD  oxyCODONE-acetaminophen (PERCOCET) 10-325 MG tablet Take 1 tablet by mouth 4 (four) times daily as needed. Pain. 06/15/15  Yes Historical Provider, MD  RELPAX 40 MG tablet Take 40 mg by mouth every 2 (two) hours as needed for migraine.  07/02/15  Yes Historical Provider, MD  traZODone (DESYREL) 100 MG tablet Take 200 mg by mouth at bedtime.    Yes Historical Provider, MD  cephALEXin (KEFLEX) 500 MG capsule Take 1 capsule (500 mg total) by mouth 4 (four) times daily. 07/09/15   Nicole Pisciotta, PA-C   BP 114/41 mmHg  Pulse 57  Temp(Src) 97.6 F (36.4 C) (Oral)  Resp 16  Ht 5\' 5"  (1.651 m)  Wt 130 lb (58.968 kg)  BMI 21.63 kg/m2  SpO2 97% Physical Exam  Constitutional: She is oriented to person, place, and time. She appears well-developed and well-nourished. No distress.  HENT:  Head: Normocephalic and atraumatic.  Eyes: Conjunctivae and EOM are normal.  Neck: Normal range of motion.  Cardiovascular: Normal rate, regular rhythm, normal heart sounds and intact distal pulses.  Exam reveals no gallop and no friction rub.   No murmur heard. Pulmonary/Chest: Effort normal and breath sounds normal. No respiratory distress. She has no wheezes. She has no rales.  Abdominal: Soft. She exhibits no distension. There is no tenderness. There is no guarding.  Musculoskeletal: She exhibits no edema or tenderness.  Neurological: She is alert and oriented to person, place, and time. A cranial nerve deficit (reports numbness right mid-face) and sensory deficit (right face) is present. Coordination normal.  Right sided UE and LE weakness +pronator drift  Skin: Skin is warm and dry. No rash noted. She is not  diaphoretic. No erythema.  Nursing note and vitals reviewed.   ED Course  Procedures (including critical care time) Labs Review Labs Reviewed  APTT - Abnormal; Notable for the following:    aPTT 23 (*)    All other components within normal limits  CBC - Abnormal; Notable for the following:    Hemoglobin 11.9 (*)    All other components within normal limits  COMPREHENSIVE METABOLIC PANEL - Abnormal; Notable for the following:    Total Protein 6.0 (*)    All other components within normal limits  ETHANOL  PROTIME-INR  DIFFERENTIAL  URINE RAPID DRUG SCREEN, HOSP PERFORMED  URINALYSIS, ROUTINE W REFLEX MICROSCOPIC (NOT AT [facility])  HEMOGLOBIN A1C  LIPID PANEL  CBC  COMPREHENSIVE METABOLIC PANEL  I-STAT CHEM 8, ED  I-STAT TROPOININ, ED    Imaging Review Ct Head Wo Contrast  12/17/2015  CLINICAL DATA:  Right-sided weakness.  Headache and dizziness. EXAM: CT HEAD WITHOUT CONTRAST TECHNIQUE: Contiguous axial images were obtained from the base of the skull through the vertex without intravenous contrast. COMPARISON:  Head CT 07/09/2015 FINDINGS: No intracranial hemorrhage, mass effect, or midline shift. No hydrocephalus. The basilar cisterns are patent. No evidence of territorial infarct. Stable degree of chronic small vessel ischemia. No intracranial fluid collection. Calvarium is intact. Included paranasal sinuses and mastoid air cells are well aerated. IMPRESSION: No acute intracranial abnormality. Stable mild chronic small vessel ischemia. Electronically Signed   By: Jeb Levering M.D.   On: 12/17/2015 23:06   I have personally reviewed and evaluated these images and lab results as part of my medical decision-making.   EKG Interpretation   Date/Time:  Monday Dec 17 2015 20:54:55 EDT Ventricular Rate:  68 PR Interval:  124 QRS Duration: 89 QT Interval:  443 QTC Calculation: 471 R Axis:   52 Text Interpretation:  Sinus rhythm Borderline repolarization abnormality  No  significant change since last tracing Confirmed by [facility] MD, [place]  ([id number]) on 12/17/2015 10:15:40 PM      MDM   Final diagnoses:  Right sided weakness   65 year old female with a history of prior CVA, hyperlipidemia presents with concern of right-sided weakness and difficulty speaking which began early this morning and has slowly worsened throughout the day.  Given duration of symptoms, pt not a candidate for tPA.  CT head performed showing no acute abnormalities, however, given symptoms and risk factors have continued concern for possible CVA.  Pt also with headache and complicated migraine on differential, however given age and pt not having these symptoms, will continue with stroke work up. Given compazine/benadryl. No infectious signs/symptoms.  Discussed with Dr. Nicole Kindred of Neurology and will admit to hospitalist for further care.     Gareth Morgan, MD 12/18/15 [phone]

## 2015-12-18 ENCOUNTER — Observation Stay (HOSPITAL_BASED_OUTPATIENT_CLINIC_OR_DEPARTMENT_OTHER): Payer: BLUE CROSS/BLUE SHIELD

## 2015-12-18 ENCOUNTER — Observation Stay (HOSPITAL_COMMUNITY): Payer: BLUE CROSS/BLUE SHIELD

## 2015-12-18 DIAGNOSIS — R0989 Other specified symptoms and signs involving the circulatory and respiratory systems: Secondary | ICD-10-CM

## 2015-12-18 DIAGNOSIS — R270 Ataxia, unspecified: Secondary | ICD-10-CM | POA: Diagnosis not present

## 2015-12-18 DIAGNOSIS — M6289 Other specified disorders of muscle: Secondary | ICD-10-CM

## 2015-12-18 DIAGNOSIS — G43909 Migraine, unspecified, not intractable, without status migrainosus: Secondary | ICD-10-CM | POA: Diagnosis not present

## 2015-12-18 DIAGNOSIS — G459 Transient cerebral ischemic attack, unspecified: Secondary | ICD-10-CM | POA: Diagnosis not present

## 2015-12-18 LAB — LIPID PANEL
CHOL/HDL RATIO: 3.3 ratio
CHOLESTEROL: 156 mg/dL (ref 0–200)
HDL: 47 mg/dL (ref >40)
LDL Cholesterol: 89 mg/dL (ref 0–99)
Triglycerides: 100 mg/dL (ref <150)
VLDL: 20 mg/dL (ref 0–40)

## 2015-12-18 LAB — CBC
HCT: 36.8 % (ref 36.0–46.0)
Hemoglobin: 11.2 g/dL — ABNORMAL LOW (ref 12.0–15.0)
MCH: 28.1 pg (ref 26.0–34.0)
MCHC: 30.4 g/dL (ref 30.0–36.0)
MCV: 92.2 fL (ref 78.0–100.0)
PLATELETS: 192 10*3/uL (ref 150–400)
RBC: 3.99 MIL/uL (ref 3.87–5.11)
RDW: 14.5 % (ref 11.5–15.5)
WBC: 5.4 10*3/uL (ref 4.0–10.5)

## 2015-12-18 LAB — COMPREHENSIVE METABOLIC PANEL
ALBUMIN: 3.1 g/dL — AB (ref 3.5–5.0)
ALT: 16 U/L (ref 14–54)
ANION GAP: 6 (ref 5–15)
AST: 14 U/L — ABNORMAL LOW (ref 15–41)
Alkaline Phosphatase: 78 U/L (ref 38–126)
BUN: 7 mg/dL (ref 6–20)
CHLORIDE: 109 mmol/L (ref 101–111)
CO2: 28 mmol/L (ref 22–32)
CREATININE: 0.59 mg/dL (ref 0.44–1.00)
Calcium: 9.1 mg/dL (ref 8.9–10.3)
GFR calc non Af Amer: >60 mL/min (ref >60)
GLUCOSE: 90 mg/dL (ref 65–99)
Potassium: 4.2 mmol/L (ref 3.5–5.1)
SODIUM: 143 mmol/L (ref 135–145)
Total Bilirubin: 0.7 mg/dL (ref 0.3–1.2)
Total Protein: 5.8 g/dL — ABNORMAL LOW (ref 6.5–8.1)

## 2015-12-18 LAB — SEDIMENTATION RATE: Sed Rate: 16 mm/hr (ref 0–22)

## 2015-12-18 LAB — AMMONIA: AMMONIA: 20 umol/L (ref 9–35)

## 2015-12-18 LAB — ECHOCARDIOGRAM COMPLETE
HEIGHTINCHES: 65 in
WEIGHTICAEL: 2080 [oz_av]

## 2015-12-18 MED ORDER — ASPIRIN 325 MG PO TABS
325.0000 mg | ORAL_TABLET | Freq: Every day | ORAL | Status: DC
  Administered 2015-12-18 – 2015-12-21 (×4): 325 mg via ORAL
  Filled 2015-12-18 (×5): qty 1

## 2015-12-18 MED ORDER — FLUOXETINE HCL 20 MG PO CAPS
60.0000 mg | ORAL_CAPSULE | Freq: Every day | ORAL | Status: DC
  Administered 2015-12-18 – 2015-12-21 (×4): 60 mg via ORAL
  Filled 2015-12-18 (×4): qty 3

## 2015-12-18 MED ORDER — BUPROPION HCL ER (XL) 300 MG PO TB24: 100.0000 mg | ORAL_TABLET | Freq: Two times a day (BID) | ORAL | Status: DC

## 2015-12-18 MED ORDER — SODIUM CHLORIDE 0.9 % IV SOLN
INTRAVENOUS | Status: AC
  Administered 2015-12-18: 03:00:00 via INTRAVENOUS

## 2015-12-18 MED ORDER — OXYCODONE HCL 5 MG PO TABS
10.0000 mg | ORAL_TABLET | Freq: Four times a day (QID) | ORAL | Status: DC | PRN
  Administered 2015-12-18 – 2015-12-21 (×10): 10 mg via ORAL
  Filled 2015-12-18 (×10): qty 2

## 2015-12-18 MED ORDER — DIPHENHYDRAMINE HCL 50 MG/ML IJ SOLN
25.0000 mg | Freq: Once | INTRAMUSCULAR | Status: AC
  Administered 2015-12-18: 25 mg via INTRAVENOUS
  Filled 2015-12-18: qty 1

## 2015-12-18 MED ORDER — MONTELUKAST SODIUM 10 MG PO TABS
10.0000 mg | ORAL_TABLET | Freq: Every day | ORAL | Status: DC
  Administered 2015-12-18 – 2015-12-20 (×3): 10 mg via ORAL
  Filled 2015-12-18 (×3): qty 1

## 2015-12-18 MED ORDER — BUPROPION HCL ER (XL) 150 MG PO TB24
450.0000 mg | ORAL_TABLET | Freq: Every day | ORAL | Status: DC
  Administered 2015-12-18: 450 mg via ORAL
  Filled 2015-12-18: qty 3

## 2015-12-18 MED ORDER — ASPIRIN 300 MG RE SUPP: 300.0000 mg | Freq: Every day | RECTAL | Status: DC

## 2015-12-18 MED ORDER — PROCHLORPERAZINE EDISYLATE 5 MG/ML IJ SOLN
10.0000 mg | Freq: Once | INTRAMUSCULAR | Status: AC
  Administered 2015-12-18: 10 mg via INTRAVENOUS
  Filled 2015-12-18: qty 2

## 2015-12-18 MED ORDER — BUPROPION HCL ER (SMOKING DET) 150 MG PO TB12: 150.0000 mg | ORAL_TABLET | Freq: Every day | ORAL | Status: DC

## 2015-12-18 MED ORDER — PROCHLORPERAZINE EDISYLATE 5 MG/ML IJ SOLN
10.0000 mg | Freq: Once | INTRAMUSCULAR | Status: AC
  Administered 2015-12-20: 10 mg via INTRAVENOUS
  Filled 2015-12-18 (×3): qty 2

## 2015-12-18 MED ORDER — LORATADINE 10 MG PO TABS
10.0000 mg | ORAL_TABLET | Freq: Every day | ORAL | Status: DC
  Administered 2015-12-18 – 2015-12-21 (×4): 10 mg via ORAL
  Filled 2015-12-18 (×5): qty 1

## 2015-12-18 MED ORDER — DIPHENHYDRAMINE HCL 50 MG/ML IJ SOLN
25.0000 mg | Freq: Once | INTRAMUSCULAR | Status: DC
Start: 2015-12-18 — End: 2015-12-18

## 2015-12-18 MED ORDER — LEVOCETIRIZINE DIHYDROCHLORIDE 5 MG PO TABS: 5.0000 mg | ORAL_TABLET | Freq: Every evening | ORAL | Status: DC

## 2015-12-18 MED ORDER — PROCHLORPERAZINE EDISYLATE 5 MG/ML IJ SOLN: 10.0000 mg | Freq: Once | INTRAMUSCULAR | Status: DC

## 2015-12-18 MED ORDER — KETOROLAC TROMETHAMINE 30 MG/ML IJ SOLN
30.0000 mg | Freq: Once | INTRAMUSCULAR | Status: AC
  Administered 2015-12-18: 30 mg via INTRAVENOUS
  Filled 2015-12-18: qty 1

## 2015-12-18 MED ORDER — VALPROATE SODIUM 500 MG/5ML IV SOLN
500.0000 mg | Freq: Once | INTRAVENOUS | Status: AC
  Administered 2015-12-18: 500 mg via INTRAVENOUS
  Filled 2015-12-18: qty 5

## 2015-12-18 MED ORDER — TRAZODONE HCL 100 MG PO TABS
200.0000 mg | ORAL_TABLET | Freq: Every day | ORAL | Status: DC
  Administered 2015-12-18 – 2015-12-20 (×3): 200 mg via ORAL
  Filled 2015-12-18 (×3): qty 2

## 2015-12-18 MED ORDER — MAGNESIUM SULFATE 2 GM/50ML IV SOLN
2.0000 g | Freq: Once | INTRAVENOUS | Status: AC
  Administered 2015-12-18: 2 g via INTRAVENOUS
  Filled 2015-12-18 (×2): qty 50

## 2015-12-18 MED ORDER — FLUTICASONE PROPIONATE 50 MCG/ACT NA SUSP
2.0000 | Freq: Every day | NASAL | Status: DC
  Administered 2015-12-21: 2 via NASAL
  Filled 2015-12-18 (×2): qty 16

## 2015-12-18 MED ORDER — ENOXAPARIN SODIUM 40 MG/0.4ML ~~LOC~~ SOLN
40.0000 mg | SUBCUTANEOUS | Status: DC
  Administered 2015-12-18 – 2015-12-21 (×4): 40 mg via SUBCUTANEOUS
  Filled 2015-12-18 (×5): qty 0.4

## 2015-12-18 MED ORDER — CLONAZEPAM 1 MG PO TABS: 1.0000 mg | ORAL_TABLET | Freq: Every evening | ORAL | Status: DC | PRN

## 2015-12-18 MED ORDER — STROKE: EARLY STAGES OF RECOVERY BOOK
Freq: Once | Status: AC
  Administered 2015-12-18: 03:00:00

## 2015-12-18 MED ORDER — HYDROCODONE-ACETAMINOPHEN 5-325 MG PO TABS
1.0000 | ORAL_TABLET | Freq: Four times a day (QID) | ORAL | Status: DC | PRN
Start: 2015-12-18 — End: 2015-12-18
  Administered 2015-12-18 (×2): 1 via ORAL
  Filled 2015-12-18 (×2): qty 1

## 2015-12-18 MED ORDER — DIPHENHYDRAMINE HCL 50 MG/ML IJ SOLN
25.0000 mg | Freq: Once | INTRAMUSCULAR | Status: DC
Start: 2015-12-18 — End: 2015-12-20
  Filled 2015-12-18: qty 1

## 2015-12-18 NOTE — Progress Notes (Signed)
PROGRESS NOTE    Dorothy White  [id number] DOB: [birth year] DOA: 12/17/2015 PCP: Jonathon Bellows, MD   Outpatient Specialists:     Brief Narrative:  Dorothy White is a 65 y.o. female with medical history significant of depression and migraine presents to the ER with increasing difficulty walking. Patient states she has been losing her balance when she tries to walk. Later the patient also started developing frontal headache which is not typical of her migraine. Patient also has closely. No facial asymmetry is able to move all extremities 5 x 5. Perla positive. Patient denies any new medications. Denies any nausea vomiting abdominal pain chest pain or shortness of breath. CT of the head is unremarkable. ER physician had discussed with on-call neurologist at [facility] Dr. Wallie Char who has advised patient to be transferred to [facility]. Patient is agreeable to transfer.    Assessment & Plan:   Principal Problem:   Ataxia Active Problems:   Right sided weakness   Complicated migraine -will get headache cocktail by neuro -MRI negative PT eval -denies alcohol- GGT  Ataxia -see above PT eval pending  ?emphysema Outpatient  PFTs   DVT prophylaxis:  Lovenox   Code Status: Full Code   Family Communication:   Disposition Plan:     Consultants:     Procedures:     Antimicrobials:      Subjective: C/o headache on top of head Denies alcohol  Objective: Filed Vitals:   12/18/15 0500 12/18/15 0700 12/18/15 0800 12/18/15 1000  BP: 101/45 113/40 126/51 122/51  Pulse: 61 61 64 64  Temp:  98.2 F (36.8 C) 97.9 F (36.6 C) 98.5 F (36.9 C)  TempSrc:  Oral Oral Oral  Resp: 16 16 12 15   Height:      Weight:      SpO2: 98% 97% 98% 96%   No intake or output data in the 24 hours ending 12/18/15 1351 Filed Weights   12/17/15 2056 12/18/15 0248  Weight: 58.968 kg (130 lb) 58.968 kg (130 lb)    Examination:  General exam: Appears calm  and comfortable  Respiratory system: Clear to auscultation. Respiratory effort normal. Cardiovascular system: S1 & S2 heard, RRR. No JVD, murmurs, rubs, gallops or clicks. No pedal edema. Gastrointestinal system: Abdomen is nondistended, soft and nontender. No organomegaly or masses felt. Normal bowel sounds heard. Left hand fine tremor    Data Reviewed: I have personally reviewed following labs and imaging studies  CBC:  Recent Labs Lab 12/17/15 2112 12/17/15 2122 12/18/15 0510  WBC 6.5  --  5.4  NEUTROABS 4.0  --   --   HGB 11.9* 12.9 11.2*  HCT 36.5 38.0 36.8  MCV 87.7  --  92.2  PLT 199  --  [id number]   Basic Metabolic Panel:  Recent Labs Lab 12/17/15 2112 12/17/15 2122 12/18/15 0510  NA 139 143 143  K 3.5 3.5 4.2  CL 109 104 109  CO2 23  --  28  GLUCOSE 98 96 90  BUN 12 11 7   CREATININE 0.57 0.50 0.59  CALCIUM 9.0  --  9.1   GFR: Estimated Creatinine Clearance: 63.9 mL/min (by C-G formula based on Cr of 0.59). Liver Function Tests:  Recent Labs Lab 12/17/15 2112 12/18/15 0510  AST 18 14*  ALT 18 16  ALKPHOS 85 78  BILITOT 0.6 0.7  PROT 6.0* 5.8*  ALBUMIN 3.5 3.1*   No results for input(s): LIPASE, AMYLASE in the last 168 hours.  Recent Labs Lab 12/18/15 0510  AMMONIA 20   Coagulation Profile:  Recent Labs Lab 12/17/15 2112  INR 1.03   Cardiac Enzymes: No results for input(s): CKTOTAL, CKMB, CKMBINDEX, TROPONINI in the last 168 hours. BNP (last 3 results) No results for input(s): PROBNP in the last 8760 hours. HbA1C: No results for input(s): HGBA1C in the last 72 hours. CBG: No results for input(s): GLUCAP in the last 168 hours. Lipid Profile:  Recent Labs  12/18/15 0510  CHOL 156  HDL 47  LDLCALC 89  TRIG 100  CHOLHDL 3.3   Thyroid Function Tests: No results for input(s): TSH, T4TOTAL, FREET4, T3FREE, THYROIDAB in the last 72 hours. Anemia Panel: No results for input(s): VITAMINB12, FOLATE, FERRITIN, TIBC, IRON, RETICCTPCT in  the last 72 hours. Urine analysis:    Component Value Date/Time   COLORURINE YELLOW 12/17/2015 2218   [place] 12/17/2015 2218   LABSPEC 1.009 12/17/2015 2218   PHURINE 6.5 12/17/2015 2218   GLUCOSEU NEGATIVE 12/17/2015 2218   GLUCOSEU NEGATIVE 04/13/2007 1417   HGBUR NEGATIVE 12/17/2015 2218   BILIRUBINUR NEGATIVE 12/17/2015 2218   KETONESUR NEGATIVE 12/17/2015 2218   PROTEINUR NEGATIVE 12/17/2015 2218   UROBILINOGEN 0.2 02/04/2015 1800   NITRITE NEGATIVE 12/17/2015 2218   LEUKOCYTESUR NEGATIVE 12/17/2015 2218     )No results found for this or any previous visit (from the past 240 hour(s)).    Anti-infectives    None       Radiology Studies: Dg Chest 2 View  12/18/2015  CLINICAL DATA:  Difficulty walking, dizziness, weakness, smoking history EXAM: CHEST  2 VIEW COMPARISON:  Chest x-ray of 09/25/2015 FINDINGS: The lungs remain clear and somewhat hyperaerated. A small right pleural effusion cannot be excluded. Mediastinal and hilar contours are unremarkable. The heart is within normal limits in size. The bones are diffusely osteopenic. IMPRESSION: No change in hyper aeration consistent with emphysema. Cannot exclude a tiny right pleural effusion. Electronically Signed   By: Ivar Drape M.D.   On: 12/18/2015 08:13   Ct Head Wo Contrast  12/17/2015  CLINICAL DATA:  Right-sided weakness.  Headache and dizziness. EXAM: CT HEAD WITHOUT CONTRAST TECHNIQUE: Contiguous axial images were obtained from the base of the skull through the vertex without intravenous contrast. COMPARISON:  Head CT 07/09/2015 FINDINGS: No intracranial hemorrhage, mass effect, or midline shift. No hydrocephalus. The basilar cisterns are patent. No evidence of territorial infarct. Stable degree of chronic small vessel ischemia. No intracranial fluid collection. Calvarium is intact. Included paranasal sinuses and mastoid air cells are well aerated. IMPRESSION: No acute intracranial abnormality. Stable mild  chronic small vessel ischemia. Electronically Signed   By: Jeb Levering M.D.   On: 12/17/2015 23:06   Mr Brain Wo Contrast  12/18/2015  CLINICAL DATA:  Initial evaluation for acute headache with ataxia. EXAM: MRI HEAD WITHOUT CONTRAST MRA HEAD WITHOUT CONTRAST TECHNIQUE: Multiplanar, multiecho pulse sequences of the brain and surrounding structures were obtained without intravenous contrast. Angiographic images of the head were obtained using MRA technique without contrast. COMPARISON:  Prior CT from 12/17/2015. FINDINGS: MRI HEAD FINDINGS Age appropriate cerebral atrophy present. Few scattered T2/FLAIR hyperintense foci noted within the periventricular, deep, and subcortical white matter of both cerebral hemispheres, nonspecific, but most likely related to mild chronic small vessel ischemic disease. These foci are stable relative to previous exams. No abnormal foci of restricted diffusion to suggest acute intracranial infarct. Gray-white matter differentiation maintained. Major intracranial vascular flow voids are preserved. No areas of chronic infarction. No  acute or chronic intracranial hemorrhage. No mass lesion, midline shift, or mass effect. No hydrocephalus. No extra-axial fluid collection. Major dural sinuses are grossly patent. Craniocervical junction normal. Visualized upper cervical spine unremarkable. Pituitary gland within normal limits. No acute abnormality about the orbits. Mild scattered mucosal thickening within the paranasal sinuses. No air-fluid levels to suggest active sinus infection. No mastoid effusion. Inner ear structures grossly normal. Bone marrow signal intensity within normal limits. No scalp soft tissue abnormality. MRA HEAD FINDINGS ANTERIOR CIRCULATION: Visualized distal cervical segments of the internal carotid arteries are widely patent with antegrade flow. Petrous, cavernous, and supraclinoid segments widely patent bilaterally without flow-limiting stenosis. A1 segments  patent. Anterior communicating artery normal. Anterior cerebral arteries well opacified. M1 segments patent without stenosis or occlusion. MCA bifurcations normal. MCA branches well opacified and fairly symmetric. POSTERIOR CIRCULATION: Vertebral arteries largely code dominant and patent to the vertebrobasilar junction. Posterior inferior cerebral arteries patent bilaterally. Basilar artery widely patent to its distal aspect. Basilar tip is ectatic without frank aneurysm. Superior cerebral arteries patent bilaterally. Both of the posterior cerebral arteries arise from the basilar arteries and are well opacified to their distal aspects. Small right posterior communicating artery noted. No aneurysm or vascular malformation. IMPRESSION: MRI HEAD IMPRESSION: 1. No acute intracranial infarct or other process identified. 2. Stable mild nonspecific white matter disease. MRA HEAD IMPRESSION: Negative intracranial MRA. No large or proximal arterial branch occlusion. No high-grade or correctable stenosis. Electronically Signed   By: Jeannine Boga M.D.   On: 12/18/2015 06:59   Mr Jodene Nam Head/brain Wo Cm  12/18/2015  CLINICAL DATA:  Initial evaluation for acute headache with ataxia. EXAM: MRI HEAD WITHOUT CONTRAST MRA HEAD WITHOUT CONTRAST TECHNIQUE: Multiplanar, multiecho pulse sequences of the brain and surrounding structures were obtained without intravenous contrast. Angiographic images of the head were obtained using MRA technique without contrast. COMPARISON:  Prior CT from 12/17/2015. FINDINGS: MRI HEAD FINDINGS Age appropriate cerebral atrophy present. Few scattered T2/FLAIR hyperintense foci noted within the periventricular, deep, and subcortical white matter of both cerebral hemispheres, nonspecific, but most likely related to mild chronic small vessel ischemic disease. These foci are stable relative to previous exams. No abnormal foci of restricted diffusion to suggest acute intracranial infarct. Gray-white  matter differentiation maintained. Major intracranial vascular flow voids are preserved. No areas of chronic infarction. No acute or chronic intracranial hemorrhage. No mass lesion, midline shift, or mass effect. No hydrocephalus. No extra-axial fluid collection. Major dural sinuses are grossly patent. Craniocervical junction normal. Visualized upper cervical spine unremarkable. Pituitary gland within normal limits. No acute abnormality about the orbits. Mild scattered mucosal thickening within the paranasal sinuses. No air-fluid levels to suggest active sinus infection. No mastoid effusion. Inner ear structures grossly normal. Bone marrow signal intensity within normal limits. No scalp soft tissue abnormality. MRA HEAD FINDINGS ANTERIOR CIRCULATION: Visualized distal cervical segments of the internal carotid arteries are widely patent with antegrade flow. Petrous, cavernous, and supraclinoid segments widely patent bilaterally without flow-limiting stenosis. A1 segments patent. Anterior communicating artery normal. Anterior cerebral arteries well opacified. M1 segments patent without stenosis or occlusion. MCA bifurcations normal. MCA branches well opacified and fairly symmetric. POSTERIOR CIRCULATION: Vertebral arteries largely code dominant and patent to the vertebrobasilar junction. Posterior inferior cerebral arteries patent bilaterally. Basilar artery widely patent to its distal aspect. Basilar tip is ectatic without frank aneurysm. Superior cerebral arteries patent bilaterally. Both of the posterior cerebral arteries arise from the basilar arteries and are well opacified to their distal aspects. Small  right posterior communicating artery noted. No aneurysm or vascular malformation. IMPRESSION: MRI HEAD IMPRESSION: 1. No acute intracranial infarct or other process identified. 2. Stable mild nonspecific white matter disease. MRA HEAD IMPRESSION: Negative intracranial MRA. No large or proximal arterial branch  occlusion. No high-grade or correctable stenosis. Electronically Signed   By: Jeannine Boga M.D.   On: 12/18/2015 06:59        Scheduled Meds: . aspirin  300 mg Rectal Daily   Or  . aspirin  325 mg Oral Daily  . buPROPion  450 mg Oral Daily  . enoxaparin (LOVENOX) injection  40 mg Subcutaneous Q24H  . FLUoxetine  60 mg Oral Daily  . fluticasone  2 spray Each Nare Daily  . loratadine  10 mg Oral Daily  . montelukast  10 mg Oral QHS  . traZODone  200 mg Oral QHS   Continuous Infusions: . sodium chloride 50 mL/hr at 12/18/15 0325        Time spent: 25 min    [place], DO [facility] Pager [phone]  If 7PM-7AM, please contact night-coverage [email or web address] Password [email or web address] 12/18/2015, 1:51 PM

## 2015-12-18 NOTE — Consult Note (Signed)
Admission H&P    Chief Complaint: unstable gait.  HPI: Dorothy White is an 65 y.o. female hyperlipidemia, depression, anxiety, TIA and headaches, who presented to the ED at [facility] on 12/17/2015 with complaint of unsteady gait with tendency to fall to the right side. There was equivocal weakness on exam in the ED. Patient was not aware of being any weaker on the right side than on the left. CT scan of the head showed no intracranial abnormality. Subsequent MRI and MRA performed earlier today were unremarkable. She's been taking aspirin on an every other day basis. NIH stroke scale at the time of this evaluation was 0.  Past Medical History  Diagnosis Date  . Arrhythmia   . Diverticulitis   . Depression   . Anxiety   . HLD (hyperlipidemia)   . Chronic back pain   . Stroke [facility])     "mini" stroke - 06/2013  . Headache(784.0)     Migraines  . GERD (gastroesophageal reflux disease)   . Arthritis   . Cancer ([place])     basal cell skin cancer  . Anemia   . Constipation     Past Surgical History  Procedure Laterality Date  . Tonsillectomy    . Abdominal hysterectomy    . Breast enhancement surgery    . Rhinoplasty    . Oophorectomy Bilateral   . Bunionectomy Bilateral   . Colonoscopy    . Orif humerus fracture Right 02/15/2015    Procedure: OPEN REDUCTION INTERNAL FIXATION (ORIF) PROXIMAL HUMERUS FRACTURE;  Surgeon: Netta Cedars, MD;  Location: [place];  Service: Orthopedics;  Laterality: Right;    Family History  Problem Relation Age of Onset  . Lung cancer Mother   . Heart attack Father   . Hypertension Brother   . Esophageal cancer Brother   . Colon cancer Neg Hx   . Diabetes Paternal Grandmother     questionable  . Kidney disease Neg Hx   . Liver disease Neg Hx    Social History:  reports that she has been smoking Cigarettes.  She has never used smokeless tobacco. She reports that she does not drink alcohol or use illicit drugs.  Allergies:   Penicillins  Medications Prior to Admission  Medication Sig Dispense Refill  . bismuth subsalicylate (PEPTO BISMOL) 262 MG chewable tablet Chew 524 mg by mouth as needed for indigestion.    Marland Kitchen buPROPion (WELLBUTRIN XL) 150 MG 24 hr tablet Take 150 mg by mouth daily.    Marland Kitchen buPROPion (WELLBUTRIN XL) 300 MG 24 hr tablet Take 300 mg by mouth daily.   0  . calcium-vitamin D (OSCAL WITH D) 500-200 MG-UNIT per tablet Take 1 tablet by mouth daily with breakfast.    . clonazePAM (KLONOPIN) 1 MG tablet Take 1 mg by mouth at bedtime as needed (anxiety/sleep.).   3  . FLUoxetine (PROZAC) 20 MG capsule Take 60 mg by mouth daily.     Marland Kitchen HYDROcodone-acetaminophen (NORCO/VICODIN) 5-325 MG tablet Take 1-2 tablets by mouth every 6 hours as needed for pain and/or cough. 7 tablet 0  . levocetirizine (XYZAL ALLERGY 24HR) 5 MG tablet Take 5 mg by mouth every evening.    . mometasone (NASONEX) 50 MCG/ACT nasal spray Place 2 sprays into the nose daily.  11  . montelukast (SINGULAIR) 10 MG tablet Take 10 mg by mouth daily.     Marland Kitchen oxyCODONE-acetaminophen (PERCOCET) 10-325 MG tablet Take 1 tablet by mouth 4 (four) times daily as needed. Pain.  0  . RELPAX 40 MG tablet Take 40 mg by mouth every 2 (two) hours as needed for migraine.   11  . traZODone (DESYREL) 100 MG tablet Take 200 mg by mouth at bedtime.     . cephALEXin (KEFLEX) 500 MG capsule Take 1 capsule (500 mg total) by mouth 4 (four) times daily. 20 capsule 0    ROS: History obtained from the patient  General ROS: negative for - chills, fatigue, fever, night sweats, weight gain or weight loss Psychological ROS: negative for - behavioral disorder, hallucinations, memory difficulties, mood swings or suicidal ideation Ophthalmic ROS: negative for - blurry vision, double vision, eye pain or loss of vision ENT ROS: negative for - epistaxis, nasal discharge, oral lesions, sore throat, tinnitus or vertigo Allergy and Immunology ROS: negative for - hives or  itchy/watery eyes Hematological and Lymphatic ROS: negative for - bleeding problems, bruising or swollen lymph nodes Endocrine ROS: negative for - galactorrhea, hair pattern changes, polydipsia/polyuria or temperature intolerance Respiratory ROS: negative for - cough, hemoptysis, shortness of breath or wheezing Cardiovascular ROS: negative for - chest pain, dyspnea on exertion, edema or irregular heartbeat Gastrointestinal ROS: negative for - abdominal pain, diarrhea, hematemesis, nausea/vomiting or stool incontinence Genito-Urinary ROS: negative for - dysuria, hematuria, incontinence or urinary frequency/urgency Musculoskeletal ROS: negative for - joint swelling or muscular weakness Neurological ROS: as noted in HPI Dermatological ROS: negative for rash and skin lesion changes  Physical Examination: Blood pressure 101/45, pulse 61, temperature 97.6 F (36.4 C), temperature source Oral, resp. rate 16, height _0  (1.651 m), weight 58.968 kg (130 lb), SpO2 98 %.  HEENT-  Normocephalic, no lesions, without obvious abnormality.  Normal external eye and conjunctiva.  Normal TM's bilaterally.  Normal auditory canals and external ears. Normal external nose, mucus membranes and septum.  Normal pharynx. Neck supple with no masses, nodes, nodules or enlargement. Cardiovascular - regular rate and rhythm, S1, S2 normal, no murmur, click, rub or gallop Lungs - chest clear, no wheezing, rales, normal symmetric air entry Abdomen - soft, non-tender; bowel sounds normal; no masses,  no organomegaly Extremities - no joint deformities, effusion, or inflammation and no edema  Neurologic Examination: Mental Status: Alert, oriented, no acute distress.  Speech fluent without evidence of aphasia. Able to follow commands without difficulty. Cranial Nerves: II-Visual fields were normal. III/IV/VI-Pupils were equal and reacted normally to light. Extraocular movements were full and conjugate.    V/VII-no facial  numbness and no facial weakness. VIII-normal. X-normal speech and symmetrical palatal movement. XI: trapezius strength/neck flexion strength normal bilaterally XII-midline tongue extension with normal strength. Motor: 5/5 bilaterally with normal tone and bulk Sensory: Normal throughout. Deep Tendon Reflexes: 2+ and symmetric. Plantars: Mute bilaterally Cerebellar: Normal finger-to-nose testing. Carotid auscultation: Normal  Results for orders placed or performed during the hospital encounter of 12/17/15 (from the past 48 hour(s))  Protime-INR     Status: None   Collection Time: 12/17/15  9:12 PM  Result Value Ref Range   Prothrombin Time 13.3 11.6 - 15.2 seconds   INR 1.03 0.00 - 1.49  APTT     Status: Abnormal   Collection Time: 12/17/15  9:12 PM  Result Value Ref Range   aPTT 23 (L) 24 - 37 seconds  CBC     Status: Abnormal   Collection Time: 12/17/15  9:12 PM  Result Value Ref Range   WBC 6.5 4.0 - 10.5 K/uL   RBC 4.16 3.87 - 5.11 MIL/uL   Hemoglobin 11.9 (L)  12.0 - 15.0 g/dL   HCT 36.5 36.0 - 46.0 %   MCV 87.7 78.0 - 100.0 fL   MCH 28.6 26.0 - 34.0 pg   MCHC 32.6 30.0 - 36.0 g/dL   RDW 14.3 11.5 - 15.5 %   Platelets 199 150 - 400 K/uL  Differential     Status: None   Collection Time: 12/17/15  9:12 PM  Result Value Ref Range   Neutrophils Relative % 61 %   Neutro Abs 4.0 1.7 - 7.7 K/uL   Lymphocytes Relative 19 %   Lymphs Abs 1.2 0.7 - 4.0 K/uL   Monocytes Relative 11 %   Monocytes Absolute 0.7 0.1 - 1.0 K/uL   Eosinophils Relative 8 %   Eosinophils Absolute 0.6 0.0 - 0.7 K/uL   Basophils Relative 1 %   Basophils Absolute 0.0 0.0 - 0.1 K/uL  Comprehensive metabolic panel     Status: Abnormal   Collection Time: 12/17/15  9:12 PM  Result Value Ref Range   Sodium 139 135 - 145 mmol/L   Potassium 3.5 3.5 - 5.1 mmol/L   Chloride 109 101 - 111 mmol/L   CO2 23 22 - 32 mmol/L   Glucose, Bld 98 65 - 99 mg/dL   BUN 12 6 - 20 mg/dL   Creatinine, Ser 0.57 0.44 - 1.00  mg/dL   Calcium 9.0 8.9 - 10.3 mg/dL   Total Protein 6.0 (L) 6.5 - 8.1 g/dL   Albumin 3.5 3.5 - 5.0 g/dL   AST 18 15 - 41 U/L   ALT 18 14 - 54 U/L   Alkaline Phosphatase 85 38 - 126 U/L   Total Bilirubin 0.6 0.3 - 1.2 mg/dL   GFR calc non Af Amer >60 >60 mL/min   GFR calc Af Amer >60 >60 mL/min    Comment: (NOTE) The eGFR has been calculated using the CKD EPI equation. This calculation has not been validated in all clinical situations. eGFR's persistently <60 mL/min signify possible Chronic Kidney Disease.    Anion gap 7 5 - 15  Ethanol     Status: None   Collection Time: 12/17/15  9:15 PM  Result Value Ref Range   Alcohol, Ethyl (B) <5 <5 mg/dL    Comment:        LOWEST DETECTABLE LIMIT FOR SERUM ALCOHOL IS 5 mg/dL FOR MEDICAL PURPOSES ONLY   I-stat troponin, ED (not at [facility])     Status: None   Collection Time: 12/17/15  9:21 PM  Result Value Ref Range   Troponin i, poc 0.00 0.00 - 0.08 ng/mL   Comment 3            Comment: Due to the release kinetics of cTnI, a negative result within the first hours of the onset of symptoms does not rule out myocardial infarction with certainty. If myocardial infarction is still suspected, repeat the test at appropriate intervals.   I-Stat Chem 8, ED  (not at [facility])     Status: None   Collection Time: 12/17/15  9:22 PM  Result Value Ref Range   Sodium 143 135 - 145 mmol/L   Potassium 3.5 3.5 - 5.1 mmol/L   Chloride 104 101 - 111 mmol/L   BUN 11 6 - 20 mg/dL   Creatinine, Ser 0.50 0.44 - 1.00 mg/dL   Glucose, Bld 96 65 - 99 mg/dL   Calcium, Ion 1.19 1.13 - 1.30 mmol/L   TCO2 24 0 - 100 mmol/L   Hemoglobin 12.9  12.0 - 15.0 g/dL   HCT 38.0 36.0 - 46.0 %  Urine rapid drug screen (hosp performed)not at [facility]     Status: None   Collection Time: 12/17/15 10:18 PM  Result Value Ref Range   Opiates NONE DETECTED NONE DETECTED   Cocaine NONE DETECTED NONE DETECTED   Benzodiazepines NONE DETECTED NONE DETECTED   Amphetamines  NONE DETECTED NONE DETECTED   Tetrahydrocannabinol NONE DETECTED NONE DETECTED   Barbiturates NONE DETECTED NONE DETECTED    Comment:        DRUG SCREEN FOR MEDICAL PURPOSES ONLY.  IF CONFIRMATION IS NEEDED FOR ANY PURPOSE, NOTIFY LAB WITHIN 5 DAYS.        LOWEST DETECTABLE LIMITS FOR URINE DRUG SCREEN Drug Class       Cutoff (ng/mL) Amphetamine      1000 Barbiturate      200 Benzodiazepine   403 Tricyclics       474 Opiates          300 Cocaine          300 THC              50   Urinalysis, Routine w reflex microscopic (not at [facility])     Status: None   Collection Time: 12/17/15 10:18 PM  Result Value Ref Range   Color, Urine YELLOW YELLOW   APPearance CLEAR CLEAR   Specific Gravity, Urine 1.009 1.005 - 1.030   pH 6.5 5.0 - 8.0   Glucose, UA NEGATIVE NEGATIVE mg/dL   Hgb urine dipstick NEGATIVE NEGATIVE   Bilirubin Urine NEGATIVE NEGATIVE   Ketones, ur NEGATIVE NEGATIVE mg/dL   Protein, ur NEGATIVE NEGATIVE mg/dL   Nitrite NEGATIVE NEGATIVE   Leukocytes, UA NEGATIVE NEGATIVE    Comment: MICROSCOPIC NOT DONE ON URINES WITH NEGATIVE PROTEIN, BLOOD, LEUKOCYTES, NITRITE, OR GLUCOSE <1000 mg/dL.  Lipid panel     Status: None   Collection Time: 12/18/15  5:10 AM  Result Value Ref Range   Cholesterol 156 0 - 200 mg/dL   Triglycerides 100 <150 mg/dL   HDL 47 >40 mg/dL   Total CHOL/HDL Ratio 3.3 RATIO   VLDL 20 0 - 40 mg/dL   LDL Cholesterol 89 0 - 99 mg/dL    Comment:        Total Cholesterol/HDL:CHD Risk Coronary Heart Disease Risk Table                     Men   Women  1/2 Average Risk   3.4   3.3  Average Risk       5.0   4.4  2 X Average Risk   9.6   7.1  3 X Average Risk  23.4   11.0        Use the calculated Patient Ratio above and the CHD Risk Table to determine the patient's CHD Risk.        ATP III CLASSIFICATION (LDL):  <100     mg/dL   Optimal  100-129  mg/dL   Near or Above                    Optimal  130-159  mg/dL   Borderline  160-189  mg/dL    High  >190     mg/dL   Very High   CBC     Status: Abnormal   Collection Time: 12/18/15  5:10 AM  Result Value Ref Range   WBC 5.4 4.0 - 10.5  K/uL   RBC 3.99 3.87 - 5.11 MIL/uL   Hemoglobin 11.2 (L) 12.0 - 15.0 g/dL   HCT 36.8 36.0 - 46.0 %   MCV 92.2 78.0 - 100.0 fL   MCH 28.1 26.0 - 34.0 pg   MCHC 30.4 30.0 - 36.0 g/dL   RDW 14.5 11.5 - 15.5 %   Platelets 192 150 - 400 K/uL  Comprehensive metabolic panel     Status: Abnormal   Collection Time: 12/18/15  5:10 AM  Result Value Ref Range   Sodium 143 135 - 145 mmol/L   Potassium 4.2 3.5 - 5.1 mmol/L   Chloride 109 101 - 111 mmol/L   CO2 28 22 - 32 mmol/L   Glucose, Bld 90 65 - 99 mg/dL   BUN 7 6 - 20 mg/dL   Creatinine, Ser 0.59 0.44 - 1.00 mg/dL   Calcium 9.1 8.9 - 10.3 mg/dL   Total Protein 5.8 (L) 6.5 - 8.1 g/dL   Albumin 3.1 (L) 3.5 - 5.0 g/dL   AST 14 (L) 15 - 41 U/L   ALT 16 14 - 54 U/L   Alkaline Phosphatase 78 38 - 126 U/L   Total Bilirubin 0.7 0.3 - 1.2 mg/dL   GFR calc non Af Amer >60 >60 mL/min   GFR calc Af Amer >60 >60 mL/min    Comment: (NOTE) The eGFR has been calculated using the CKD EPI equation. This calculation has not been validated in all clinical situations. eGFR's persistently <60 mL/min signify possible Chronic Kidney Disease.    Anion gap 6 5 - 15  Ammonia     Status: None   Collection Time: 12/18/15  5:10 AM  Result Value Ref Range   Ammonia 20 9 - 35 umol/L   Ct Head Wo Contrast  12/17/2015  CLINICAL DATA:  Right-sided weakness.  Headache and dizziness. EXAM: CT HEAD WITHOUT CONTRAST TECHNIQUE: Contiguous axial images were obtained from the base of the skull through the vertex without intravenous contrast. COMPARISON:  Head CT 07/09/2015 FINDINGS: No intracranial hemorrhage, mass effect, or midline shift. No hydrocephalus. The basilar cisterns are patent. No evidence of territorial infarct. Stable degree of chronic small vessel ischemia. No intracranial fluid collection. Calvarium is intact.  Included paranasal sinuses and mastoid air cells are well aerated. IMPRESSION: No acute intracranial abnormality. Stable mild chronic small vessel ischemia. Electronically Signed   By: Jeb Levering M.D.   On: 12/17/2015 23:06    Assessment/Plan 65 year old lady with history of hyperlipidemia and TIA presenting with unsteadiness of gait with tendency to fall to the right side. Exam showed no focal deficits. CT scan of her head as well as MRI of the brain showed no acute abnormality. Stroke or recurrent TIA is unlikely.  Recommendations: 1. Physical therapy consult regarding gait instability 2. Aspirin 81 mg per day 3. 2-D echocardiogram and Carotid Doppler study as planned 4. EEG as planned  No further neurological intervention is indicated if above study results unremarkable.  C.R. Nicole Kindred, MD [facility] [phone]  12/18/2015, 6:59 AM

## 2015-12-18 NOTE — Progress Notes (Signed)
Arrived from [facility]. Alert and oriented. C/o of dull headache 6/10. Oriented to room. Call light within reach.

## 2015-12-18 NOTE — Progress Notes (Signed)
*  PRELIMINARY RESULTS* Vascular Ultrasound Carotid Duplex (Doppler) has been completed.  Preliminary findings: Bilateral: No significant (1-39%) ICA stenosis. Antegrade vertebral flow.    Landry Mellow, RDMS, RVT  12/18/2015, 9:59 AM

## 2015-12-18 NOTE — Progress Notes (Signed)
PT and OT are recommending a short IP Rehab stay for this pt. Due to her current functional limitations.  Pt's MRI was unremarkable and CT showed no acute abnormalities. Pt.'s [employer] is most unlikely to authorize IP Rehab given her current diagoses.   We are not recommending IP Rehab consult at this time.  Please call if questions.  [place] Admissions Coordinator Cell [phone] Office [phone]

## 2015-12-18 NOTE — Evaluation (Signed)
Physical Therapy Evaluation Patient Details Name: Dorothy White MRN: [id number] DOB: 04/12/1951 Today's Date: 12/18/2015   History of Present Illness  Dorothy White is a 65 y.o. female with medical history significant of depression and migraine presents to the ER with increasing difficulty walking. Patient states she has been losing her balance when she tries to walk. Later the patient also started developing frontal headache which is not typical of her migraine.  Clinical Impression  Pt admitted with above. Pt with inconsistencies between MMT and functional observation. Pt was indep PTA now requires minA and RW for safe ambulation. Pt at increased falls risk. Recommend short CIR stay to address mentioned deficits and achieve safe mod I level of function for safe transition home.    Follow Up Recommendations CIR    Equipment Recommendations  Rolling walker with 5" wheels    Recommendations for Other Services Rehab consult     Precautions / Restrictions Precautions Precautions: Fall Restrictions Weight Bearing Restrictions: No      Mobility  Bed Mobility Overal bed mobility: Modified Independent             General bed mobility comments: increased time, labored effort, HOB elevated with definite use of bed rail  Transfers Overall transfer level: Needs assistance Equipment used: Rolling walker (2 wheeled);None Transfers: Sit to/from Stand Sit to Stand: Min guard         General transfer comment: v/c's to push up from bed, increased time, min guard to steady pt in standing  Ambulation/Gait Ambulation/Gait assistance: Min assist Ambulation Distance (Feet): 120 Feet Assistive device: Rolling walker (2 wheeled);None Gait Pattern/deviations: Step-through pattern;Decreased stride length;Staggering right;Wide base of support Gait velocity: decreased Gait velocity interpretation: Below normal speed for age/gender General Gait Details: pt very guarded, attempted  to amb with RW however pt with short shuffled staggered steps reaching for objects to hold onto. pt given RW. pt with improved stability and increased step length with minimal shakiness  Stairs            Wheelchair Mobility    Modified Rankin (Stroke Patients Only) Modified Rankin (Stroke Patients Only) Pre-Morbid Rankin Score: No symptoms Modified Rankin: Moderate disability     Balance Overall balance assessment: Needs assistance Sitting-balance support: Feet supported;No upper extremity supported Sitting balance-Leahy Scale: Fair     Standing balance support: Bilateral upper extremity supported Standing balance-Leahy Scale: Poor Standing balance comment: pt reaching for something to hold onto                             Pertinent Vitals/Pain Pain Assessment: 0-10 Pain Score: 2  Pain Location: head Pain Descriptors / Indicators: Headache Pain Intervention(s): Monitored during session    Home Living Family/patient expects to be discharged to:: Private residence Living Arrangements: Alone (has a friend who lives upstairs but isn't home much) Available Help at Discharge: Friend(s);Available PRN/intermittently Type of Home: House Home Access: Level entry     Home Layout: Two level;Able to live on main level with bedroom/bathroom Home Equipment: Kasandra Knudsen - single point      Prior Function Level of Independence: Independent               Hand Dominance   Dominant Hand: Right    Extremity/Trunk Assessment   Upper Extremity Assessment: RUE deficits/detail RUE Deficits / Details: grossly 4/5         Lower Extremity Assessment: Overall WFL for tasks assessed  Cervical / Trunk Assessment: Normal  Communication   Communication: No difficulties  Cognition Arousal/Alertness: Awake/alert Behavior During Therapy: Flat affect Overall Cognitive Status: Within Functional Limits for tasks assessed                      General  Comments      Exercises        Assessment/Plan    PT Assessment Patient needs continued PT services  PT Diagnosis Difficulty walking;Generalized weakness   PT Problem List Decreased strength;Decreased range of motion;Decreased balance;Decreased activity tolerance;Decreased mobility;Decreased coordination;Decreased knowledge of use of DME;Decreased safety awareness  PT Treatment Interventions DME instruction;Gait training;Stair training;Functional mobility training;Therapeutic activities;Therapeutic exercise;Balance training;Neuromuscular re-education   PT Goals (Current goals can be found in the Care Plan section) Acute Rehab PT Goals Patient Stated Goal: home asap PT Goal Formulation: With patient Time For Goal Achievement: 12/25/15 Potential to Achieve Goals: Good Additional Goals Additional Goal #1: Pt to score >19 on DGI to indicate minimal falls risk.    Frequency Min 4X/week   Barriers to discharge Decreased caregiver support lives alone    Co-evaluation               End of Session Equipment Utilized During Treatment: Gait belt Activity Tolerance: Patient tolerated treatment well Patient left: in chair;with call bell/phone within reach;with chair alarm set Nurse Communication: Mobility status    Functional Assessment Tool Used: clinical judgement Functional Limitation: Mobility: Walking and moving around Mobility: Walking and Moving Around Current Status [id number]): At least 20 percent but less than 40 percent impaired, limited or restricted Mobility: Walking and Moving Around Goal Status [phone]): At least 1 percent but less than 20 percent impaired, limited or restricted    Time: [id number] PT Time Calculation (min) (ACUTE ONLY): 22 min   Charges:   PT Evaluation $PT Eval Moderate Complexity: 1 Procedure     PT G Codes:   PT G-Codes **NOT FOR INPATIENT CLASS** Functional Assessment Tool Used: clinical judgement Functional Limitation: Mobility: Walking  and moving around Mobility: Walking and Moving Around Current Status [id number]): At least 20 percent but less than 40 percent impaired, limited or restricted Mobility: Walking and Moving Around Goal Status [phone]): At least 1 percent but less than 20 percent impaired, limited or restricted    Kingsley Callander 12/18/2015, 2:11 PM   Kittie Plater, PT, DPT Pager #: [phone] Office #: [phone]

## 2015-12-18 NOTE — Care Management Note (Signed)
Case Management Note  Patient Details  Name: [facility] MRN: [id number] Date of Birth: [birth year]  Subjective/Objective:                    Action/Plan: Patient presented with difficulty ambulating. Work-up underway. Lives at home alone. Will follow for discharge needs pending PT/OT evals and physician orders.  Expected Discharge Date:                  Expected Discharge Plan:     In-House Referral:     Discharge planning Services     Post Acute Care Choice:    Choice offered to:     DME Arranged:    DME Agency:     HH Arranged:    HH Agency:     Status of Service:  In process, will continue to follow  Medicare Important Message Given:    Date Medicare IM Given:    Medicare IM give by:    Date Additional Medicare IM Given:    Additional Medicare Important Message give by:     If discussed at [place] of Stay Meetings, dates discussed:    Additional Comments:  Rolm Baptise, RN 12/18/2015, 12:00 PM [phone]

## 2015-12-18 NOTE — Progress Notes (Signed)
  Echocardiogram 2D Echocardiogram has been performed.  [name] 12/18/2015, 3:50 PM

## 2015-12-18 NOTE — Progress Notes (Signed)
Patient seen by Dr. Nicole Kindred this morning.   She continues to be dizzy with a photophobic headache. I do wonder about complicated migraine and would favor treating as such at this time.   1) migraine cocktail 2) will follow.   Roland Rack, MD [facility] [phone]  If 7pm- 7am, please page neurology on call as listed in [place].

## 2015-12-18 NOTE — Evaluation (Signed)
Clinical/Bedside Swallow Evaluation Patient Details  Name: Dorothy White MRN: [id number] Date of Birth: [birth year]  Today's Date: 12/18/2015 Time: SLP Start Time (ACUTE ONLY): [id number] SLP Stop Time (ACUTE ONLY): 0840 SLP Time Calculation (min) (ACUTE ONLY): 23 min  Past Medical History:  Past Medical History  Diagnosis Date  . Arrhythmia   . Diverticulitis   . Depression   . Anxiety   . HLD (hyperlipidemia)   . Chronic back pain   . Stroke [facility])     "mini" stroke - 06/2013  . Headache(784.0)     Migraines  . GERD (gastroesophageal reflux disease)   . Arthritis   . Cancer ([place])     basal cell skin cancer  . Anemia   . Constipation    Past Surgical History:  Past Surgical History  Procedure Laterality Date  . Tonsillectomy    . Abdominal hysterectomy    . Breast enhancement surgery    . Rhinoplasty    . Oophorectomy Bilateral   . Bunionectomy Bilateral   . Colonoscopy    . Orif humerus fracture Right 02/15/2015    Procedure: OPEN REDUCTION INTERNAL FIXATION (ORIF) PROXIMAL HUMERUS FRACTURE;  Surgeon: Netta Cedars, MD;  Location: [place];  Service: Orthopedics;  Laterality: Right;   HPI:  pt is a 65 yo female adm to [facility] with ataxia, she failed RNSSS.  Pt MRI negative for acute changes.  CXR 2/28 showed COPD changes, nothing acute.  H/o TIA, HA, GERD, arthritis, arrythmia.     Assessment / Plan / Recommendation Clinical Impression  Pt presents with functional oropharyngeal swallow ability based on clinical swallow evaluation.  No indications of airway compromise nor focal CN deficits.  Pt admits to h/o GERD but states it is rare and she believes her swallow ability is normal.  Recommend regular/thin diet, no SLP follow up indicated.     Aspiration Risk  No limitations    Diet Recommendation Regular;Thin liquid   Liquid Administration via: Cup;Straw Medication Administration: Whole meds with liquid Supervision: Patient able to self feed Compensations: Slow rate;Small  sips/bites Postural Changes: Remain upright for at least 30 minutes after po intake;Seated upright at 90 degrees    Other  Recommendations Oral Care Recommendations: Oral care BID   Follow up Recommendations  None    Frequency and Duration   n/a         Prognosis   n/a     Swallow Study   General Date of Onset: 12/18/15 HPI: pt is a 65 yo female adm to [facility] with ataxia, she failed RNSSS.  Pt MRI negative for acute changes.  CXR 2/28 showed COPD changes, nothing acute.  H/o TIA, HA, GERD, arthritis, arrythmia.   Type of Study: Bedside Swallow Evaluation Diet Prior to this Study: NPO Temperature Spikes Noted: No Respiratory Status: Room air History of Recent Intubation: No Behavior/Cognition: Alert;Cooperative;Pleasant mood Oral Cavity Assessment: Within Functional Limits Oral Care Completed by SLP: No Oral Cavity - Dentition: Adequate natural dentition Vision: Functional for self-feeding Self-Feeding Abilities: Able to feed self Patient Positioning: Upright in bed Baseline Vocal Quality: Normal Volitional Cough: Strong Volitional Swallow: Able to elicit    Oral/Motor/Sensory Function Overall Oral Motor/Sensory Function: Within functional limits   Ice Chips Ice chips: Within functional limits   Thin Liquid Thin Liquid: Within functional limits Presentation: Cup;Straw    Nectar Thick Nectar Thick Liquid: Not tested   Honey Thick Honey Thick Liquid: Not tested   Puree Puree: Within functional limits Presentation: Self Fed;Spoon  Solid   GO   Solid: Within functional limits Presentation: Self Fed    Functional Assessment Tool Used: clinical judgement Functional Limitations: Swallowing Swallow Current Status [id number]): 0 percent impaired, limited or restricted Swallow Goal Status [id number]): 0 percent impaired, limited or restricted Swallow Discharge Status [phone]): 0 percent impaired, limited or restricted   Luanna Salk, [place] [facility] SLP [phone]

## 2015-12-18 NOTE — Evaluation (Addendum)
Occupational Therapy Evaluation Patient Details Name: Dorothy White MRN: [id number] DOB: 1951/07/18 Today's Date: 12/18/2015    History of Present Illness [name] is a 65 y.o. female with medical history significant of depression, migraines, cancer, anemia, arthritis, GERD, chronic back pain, stroke, HLD, anxiety, diverticulitis, and arrhythmia presented to the ER with increasing difficulty walking. MRI negative for acute infarct.   Clinical Impression   Pt admitted with above. Pt independent with ADLs, PTA. Feel pt will benefit from acute OT to increase independence prior to d/c. Recommending CIR for rehab.    Follow Up Recommendations  CIR;Other (comment) (full visual field assessment)    Equipment Recommendations       Recommendations for Other Services Rehab consult     Precautions / Restrictions Precautions Precautions: Fall Restrictions Weight Bearing Restrictions: No      Mobility Bed Mobility Overal bed mobility: Needs Assistance Bed Mobility: Supine to Sit;Sit to Supine     Supine to sit: Supervision Sit to supine: Supervision      Transfers Overall transfer level: Needs assistance   Transfers: Sit to/from Stand Sit to Stand: Min guard              Balance    Min guard for ambulation. Performed functional task while standing without LOB.                                        ADL Overall ADL's : Needs assistance/impaired                     Lower Body Dressing: Min guard;Sit to/from stand   Toilet Transfer: Min guard;Ambulation;Regular Museum/gallery exhibitions officer and Hygiene: Min guard;Sit to/from stand       Functional mobility during ADLs: Min guard         Vision Pt reports she has cataracts and macular degeneration; reports she has been bumping into things and missing items when reaching for them; reports reading is difficult Vision Assessment?: Yes Tracking/Visual  Pursuits: Other (comment) (difficulty in left quadrant) Visual Fields: Other (comment) (inconsistent in left superior quadrant)  Comments: Educated on how to compensate for apparent Lt upper field deficit. Recommended pt not drive until she gets results from full visual field assessment.   Perception     Praxis      Pertinent Vitals/Pain Pain Assessment: 0-10 Pain Score: 8  Pain Location: head Pain Descriptors / Indicators: Headache Pain Intervention(s): Monitored during session;Other (comment) (notified nurse)     Hand Dominance Right   Extremity/Trunk Assessment Upper Extremity Assessment Upper Extremity Assessment: RUE deficits/detail;LUE deficits/detail RUE Deficits / Details: limited ROM in right shoulder-reports shoulder surgery; weakness in shoulder flexors RUE Coordination: decreased gross motor LUE Deficits / Details: weakness in shoulder flexors LUE Coordination: decreased gross motor (reported it was more effortful for finger opposition test on left hand versus right)    Lower Extremity Assessment Lower Extremity Assessment: Defer to PT evaluation       Communication Communication Communication: No difficulties   Cognition Arousal/Alertness: Awake/alert Behavior During Therapy: WFL for tasks assessed/performed;Anxious Overall Cognitive Status: Within Functional Limits for tasks assessed                     General Comments       Exercises Exercises: Other exercises Other Exercises Other Exercises: educated on activities she can be doing  to work on gross motor coordination   Shoulder Instructions      Home Living Family/patient expects to be discharged to:: Private residence Living Arrangements: Alone (has a friend who lives upstairs but isn't home much) Available Help at Discharge: Friend(s);Available PRN/intermittently Type of Home: House Home Access: Level entry     Home Layout: Two level;Able to live on main level with  bedroom/bathroom Alternate Level Stairs-Number of Steps: flight (pt rarely goes up stairs) Alternate Level Stairs-Rails: Right Bathroom Shower/Tub: Teacher, early years/pre: Standard     Home Equipment: Cane - single point          Prior Functioning/Environment Level of Independence: Independent             OT Diagnosis: Generalized weakness   OT Problem List: Decreased strength;Pain;Decreased knowledge of use of DME or AE;Decreased coordination;Impaired vision/perception;Impaired balance (sitting and/or standing);Decreased range of motion   OT Treatment/Interventions: DME and/or AE instruction;Self-care/ADL training;Therapeutic exercise;Patient/family education;Visual/perceptual remediation/compensation;Therapeutic activities;Balance training    OT Goals(Current goals can be found in the care plan section) Acute Rehab OT Goals Patient Stated Goal: not stated OT Goal Formulation: With patient Time For Goal Achievement: 12/25/15 Potential to Achieve Goals: Good ADL Goals Pt Will Perform Lower Body Dressing: sit to/from stand;with supervision (including gathering items; with use of RW) Pt Will Transfer to Toilet: with modified independence;ambulating;regular height toilet (with least restrictive device) Pt Will Perform Toileting - Clothing Manipulation and hygiene: with modified independence;sit to/from stand Additional ADL Goal #1: Pt will independently perform HEP for bilateral UEs to increase strength. Additional ADL Goal #2: Pt will independently perform HEP for bilateral UEs to increase coordination.  OT Frequency: Min 2X/week   Barriers to D/C:            Co-evaluation              End of Session Equipment Utilized During Treatment: Gait belt Nurse Communication: Other (comment) (pt with headache)  Activity Tolerance: Patient tolerated treatment well Patient left: in bed;with nursing/sitter in room;with bed alarm set   Time: 1356-1418 OT Time  Calculation (min): 22 min Charges:  OT General Charges $OT Visit: 1 Procedure OT Evaluation $OT Eval Moderate Complexity: 1 Procedure G-Codes: OT G-codes **NOT FOR INPATIENT CLASS** Functional Assessment Tool Used: clinical judgment Functional Limitation: Self care Self Care Current Status [id number]): At least 1 percent but less than 20 percent impaired, limited or restricted Self Care Goal Status [id number]): At least 1 percent but less than 20 percent impaired, limited or restricted  Benito Mccreedy OTR/L [id number] 12/18/2015, 3:41 PM

## 2015-12-19 ENCOUNTER — Observation Stay (HOSPITAL_COMMUNITY): Payer: BLUE CROSS/BLUE SHIELD

## 2015-12-19 DIAGNOSIS — G43909 Migraine, unspecified, not intractable, without status migrainosus: Secondary | ICD-10-CM | POA: Diagnosis not present

## 2015-12-19 DIAGNOSIS — R270 Ataxia, unspecified: Secondary | ICD-10-CM | POA: Diagnosis not present

## 2015-12-19 LAB — CBC
HCT: 36.5 % (ref 36.0–46.0)
HEMOGLOBIN: 11.1 g/dL — AB (ref 12.0–15.0)
MCH: 28 pg (ref 26.0–34.0)
MCHC: 30.4 g/dL (ref 30.0–36.0)
MCV: 92.2 fL (ref 78.0–100.0)
Platelets: 194 10*3/uL (ref 150–400)
RBC: 3.96 MIL/uL (ref 3.87–5.11)
RDW: 14.4 % (ref 11.5–15.5)
WBC: 4.5 10*3/uL (ref 4.0–10.5)

## 2015-12-19 LAB — BASIC METABOLIC PANEL
Anion gap: 4 — ABNORMAL LOW (ref 5–15)
BUN: 11 mg/dL (ref 6–20)
CHLORIDE: 109 mmol/L (ref 101–111)
CO2: 28 mmol/L (ref 22–32)
CREATININE: 0.66 mg/dL (ref 0.44–1.00)
Calcium: 8.2 mg/dL — ABNORMAL LOW (ref 8.9–10.3)
GFR calc Af Amer: >60 mL/min (ref >60)
GFR calc non Af Amer: >60 mL/min (ref >60)
GLUCOSE: 86 mg/dL (ref 65–99)
POTASSIUM: 4.5 mmol/L (ref 3.5–5.1)
Sodium: 141 mmol/L (ref 135–145)

## 2015-12-19 LAB — HEMOGLOBIN A1C
Hgb A1c MFr Bld: 5.2 % (ref 4.8–5.6)
MEAN PLASMA GLUCOSE: 103 mg/dL

## 2015-12-19 LAB — GAMMA GT: GGT: 17 U/L (ref 7–50)

## 2015-12-19 MED ORDER — DIPHENHYDRAMINE HCL 50 MG/ML IJ SOLN
12.5000 mg | Freq: Four times a day (QID) | INTRAMUSCULAR | Status: AC
  Administered 2015-12-19 – 2015-12-20 (×3): 12.5 mg via INTRAVENOUS
  Filled 2015-12-19 (×3): qty 1

## 2015-12-19 MED ORDER — GADOBENATE DIMEGLUMINE 529 MG/ML IV SOLN
15.0000 mL | Freq: Once | INTRAVENOUS | Status: AC | PRN
  Administered 2015-12-19: 13 mL via INTRAVENOUS

## 2015-12-19 MED ORDER — KETOROLAC TROMETHAMINE 30 MG/ML IJ SOLN
30.0000 mg | Freq: Four times a day (QID) | INTRAMUSCULAR | Status: DC
  Administered 2015-12-19 – 2015-12-21 (×8): 30 mg via INTRAVENOUS
  Filled 2015-12-19 (×8): qty 1

## 2015-12-19 MED ORDER — PROCHLORPERAZINE EDISYLATE 5 MG/ML IJ SOLN
10.0000 mg | Freq: Four times a day (QID) | INTRAMUSCULAR | Status: AC
  Administered 2015-12-19 – 2015-12-20 (×3): 10 mg via INTRAVENOUS
  Filled 2015-12-19 (×3): qty 2

## 2015-12-19 NOTE — Progress Notes (Addendum)
Subjective: Has a 6/10 HA but able to ambulate in hall and shows no acute distress.   Exam: Filed Vitals:   12/19/15 0146 12/19/15 0539  BP: 103/46 131/63  Pulse: 58 64  Temp: 97.5 F (36.4 C) 97.4 F (36.3 C)  Resp: 18 18     Gen: In bed, NAD MS: alert and oriented CN:2-12 intact Motor: 5/5 throughout with give way weakness on the right leg Sensory: intact throughout DTR: 2+  Pertinent Labs/Diagnostics:  Vascular Ultrasound Carotid Duplex (Doppler) has been completed. Preliminary findings: Bilateral: No significant (1-39%) ICA stenosis. Antegrade vertebral flow.   Echo: Study Conclusions  - Left ventricle: The cavity size was normal. Systolic function was  mildly to moderately reduced. The estimated ejection fraction was  in the range of 40% to 45%. Moderate hypokinesis of the  basalinferior myocardium. - Pulmonary arteries: Systolic pressure was mildly to moderately  increased. PA peak pressure: 40 mm Hg (S).   Etta Quill PA-C [facility] [phone]  Impression: 65 YO female with HA and unsteady gait. With her history of migraines, I continue to suspec tcomplicated migraine. With treatment, her dizziness has improved, though her headache remains. With soime continued symptoms, I will favor further imaging, will get MRI/MRA of her neck.    Recommendations: 1) will repeat migraine cocktail.  2) MRI c-spine, MRA neck 3) will follow.    Roland Rack, MD [facility] [phone]  If 7pm- 7am, please page neurology on call as listed in [place].  12/19/2015, 10:06 AM

## 2015-12-19 NOTE — Progress Notes (Addendum)
PROGRESS NOTE    SARIN VERRETT  [id number] DOB: 06/14/1951 DOA: 12/17/2015 PCP: Jonathon Bellows, MD     Brief Narrative:  Dorothy White is a 65 y.o. female with  history significant of depression and migraine presents to the ER with ataxia, CT head with no intracranial abnormality, subsequent MRI and MRA were unremarkable, and by neurology, been evaluated for complicated migraine.  Assessment & Plan:   Principal Problem:   Ataxia Active Problems:   Right sided weakness   Complicated migraine - Neurology consult greatly appreciated, suspicious for complicated migraine, repeat migraine cocktail today,, MRI brain, MRA head with no acute findings, and is for MRI C-spine with MRA neck for further evaluation/ - ET consult appreciated, inpatient rehabilitation consult pending -denies alcohol- GGT - 2-D echo with EF 40-45%, and inferior hypokinesis, he denies any chest pain or shortness of breath, will need cardiology follow-up as an outpatient, no beta blockers or ACE inhibitor giving soft blood pressure.  Ataxia - see above  History of depression - Continue with home medication   DVT prophylaxis:  Lovenox   Code Status: Full Code   Family Communication: None at bedside  Disposition Plan:  Pending CIR consult   Consultants:   Neurology  Procedures:   None  Antimicrobials:   None   Subjective: Complains of headache, reports ataxia is improving  Objective: Filed Vitals:   12/18/15 2115 12/19/15 0146 12/19/15 0539 12/19/15 1049  BP: 98/51 103/46 131/63 120/50  Pulse: 68 58 64 60  Temp: 98 F (36.7 C) 97.5 F (36.4 C) 97.4 F (36.3 C) 98.1 F (36.7 C)  TempSrc: Oral Oral Oral Oral  Resp: 18 18 18 18   Height:      Weight:      SpO2: 95% 98% 99% 99%   No intake or output data in the 24 hours ending 12/19/15 1412 Filed Weights   12/17/15 2056 12/18/15 0248  Weight: 58.968 kg (130 lb) 58.968 kg (130 lb)    Examination:  General exam:  Appears calm and comfortable  Respiratory system: Clear to auscultation. Respiratory effort normal. Cardiovascular system: S1 & S2 heard, RRR. No JVD, murmurs, rubs, gallops or clicks. No pedal edema. Gastrointestinal system: Abdomen is nondistended, soft and nontender. No organomegaly or masses felt. Normal bowel sounds heard. Extremities: No edema, clubbing or cyanosis    Data Reviewed: I have personally reviewed following labs and imaging studies  CBC:  Recent Labs Lab 12/17/15 2112 12/17/15 2122 12/18/15 0510 12/19/15 0240  WBC 6.5  --  5.4 4.5  NEUTROABS 4.0  --   --   --   HGB 11.9* 12.9 11.2* 11.1*  HCT 36.5 38.0 36.8 36.5  MCV 87.7  --  92.2 92.2  PLT 199  --  192 [id number]   Basic Metabolic Panel:  Recent Labs Lab 12/17/15 2112 12/17/15 2122 12/18/15 0510 12/19/15 0240  NA 139 143 143 141  K 3.5 3.5 4.2 4.5  CL 109 104 109 109  CO2 23  --  28 28  GLUCOSE 98 96 90 86  BUN 12 11 7 11   CREATININE 0.57 0.50 0.59 0.66  CALCIUM 9.0  --  9.1 8.2*   GFR: Estimated Creatinine Clearance: 63.9 mL/min (by C-G formula based on Cr of 0.66). Liver Function Tests:  Recent Labs Lab 12/17/15 2112 12/18/15 0510  AST 18 14*  ALT 18 16  ALKPHOS 85 78  BILITOT 0.6 0.7  PROT 6.0* 5.8*  ALBUMIN 3.5 3.1*   No  results for input(s): LIPASE, AMYLASE in the last 168 hours.  Recent Labs Lab 12/18/15 0510  AMMONIA 20   Coagulation Profile:  Recent Labs Lab 12/17/15 2112  INR 1.03   Cardiac Enzymes: No results for input(s): CKTOTAL, CKMB, CKMBINDEX, TROPONINI in the last 168 hours. BNP (last 3 results) No results for input(s): PROBNP in the last 8760 hours. HbA1C:  Recent Labs  12/18/15 0510  HGBA1C 5.2   CBG: No results for input(s): GLUCAP in the last 168 hours. Lipid Profile:  Recent Labs  12/18/15 0510  CHOL 156  HDL 47  LDLCALC 89  TRIG 100  CHOLHDL 3.3   Thyroid Function Tests: No results for input(s): TSH, T4TOTAL, FREET4, T3FREE, THYROIDAB in  the last 72 hours. Anemia Panel: No results for input(s): VITAMINB12, FOLATE, FERRITIN, TIBC, IRON, RETICCTPCT in the last 72 hours. Urine analysis:    Component Value Date/Time   COLORURINE YELLOW 12/17/2015 2218   [place] 12/17/2015 2218   LABSPEC 1.009 12/17/2015 2218   PHURINE 6.5 12/17/2015 2218   GLUCOSEU NEGATIVE 12/17/2015 2218   GLUCOSEU NEGATIVE 04/13/2007 1417   HGBUR NEGATIVE 12/17/2015 2218   BILIRUBINUR NEGATIVE 12/17/2015 2218   KETONESUR NEGATIVE 12/17/2015 2218   PROTEINUR NEGATIVE 12/17/2015 2218   UROBILINOGEN 0.2 02/04/2015 1800   NITRITE NEGATIVE 12/17/2015 2218   LEUKOCYTESUR NEGATIVE 12/17/2015 2218     )No results found for this or any previous visit (from the past 240 hour(s)).    Anti-infectives    None       Radiology Studies: Dg Chest 2 View  12/18/2015  CLINICAL DATA:  Difficulty walking, dizziness, weakness, smoking history EXAM: CHEST  2 VIEW COMPARISON:  Chest x-ray of 09/25/2015 FINDINGS: The lungs remain clear and somewhat hyperaerated. A small right pleural effusion cannot be excluded. Mediastinal and hilar contours are unremarkable. The heart is within normal limits in size. The bones are diffusely osteopenic. IMPRESSION: No change in hyper aeration consistent with emphysema. Cannot exclude a tiny right pleural effusion. Electronically Signed   By: Ivar Drape M.D.   On: 12/18/2015 08:13   Ct Head Wo Contrast  12/17/2015  CLINICAL DATA:  Right-sided weakness.  Headache and dizziness. EXAM: CT HEAD WITHOUT CONTRAST TECHNIQUE: Contiguous axial images were obtained from the base of the skull through the vertex without intravenous contrast. COMPARISON:  Head CT 07/09/2015 FINDINGS: No intracranial hemorrhage, mass effect, or midline shift. No hydrocephalus. The basilar cisterns are patent. No evidence of territorial infarct. Stable degree of chronic small vessel ischemia. No intracranial fluid collection. Calvarium is intact. Included  paranasal sinuses and mastoid air cells are well aerated. IMPRESSION: No acute intracranial abnormality. Stable mild chronic small vessel ischemia. Electronically Signed   By: Jeb Levering M.D.   On: 12/17/2015 23:06   Mr Brain Wo Contrast  12/18/2015  CLINICAL DATA:  Initial evaluation for acute headache with ataxia. EXAM: MRI HEAD WITHOUT CONTRAST MRA HEAD WITHOUT CONTRAST TECHNIQUE: Multiplanar, multiecho pulse sequences of the brain and surrounding structures were obtained without intravenous contrast. Angiographic images of the head were obtained using MRA technique without contrast. COMPARISON:  Prior CT from 12/17/2015. FINDINGS: MRI HEAD FINDINGS Age appropriate cerebral atrophy present. Few scattered T2/FLAIR hyperintense foci noted within the periventricular, deep, and subcortical white matter of both cerebral hemispheres, nonspecific, but most likely related to mild chronic small vessel ischemic disease. These foci are stable relative to previous exams. No abnormal foci of restricted diffusion to suggest acute intracranial infarct. Gray-white matter differentiation maintained. Major  intracranial vascular flow voids are preserved. No areas of chronic infarction. No acute or chronic intracranial hemorrhage. No mass lesion, midline shift, or mass effect. No hydrocephalus. No extra-axial fluid collection. Major dural sinuses are grossly patent. Craniocervical junction normal. Visualized upper cervical spine unremarkable. Pituitary gland within normal limits. No acute abnormality about the orbits. Mild scattered mucosal thickening within the paranasal sinuses. No air-fluid levels to suggest active sinus infection. No mastoid effusion. Inner ear structures grossly normal. Bone marrow signal intensity within normal limits. No scalp soft tissue abnormality. MRA HEAD FINDINGS ANTERIOR CIRCULATION: Visualized distal cervical segments of the internal carotid arteries are widely patent with antegrade flow.  Petrous, cavernous, and supraclinoid segments widely patent bilaterally without flow-limiting stenosis. A1 segments patent. Anterior communicating artery normal. Anterior cerebral arteries well opacified. M1 segments patent without stenosis or occlusion. MCA bifurcations normal. MCA branches well opacified and fairly symmetric. POSTERIOR CIRCULATION: Vertebral arteries largely code dominant and patent to the vertebrobasilar junction. Posterior inferior cerebral arteries patent bilaterally. Basilar artery widely patent to its distal aspect. Basilar tip is ectatic without frank aneurysm. Superior cerebral arteries patent bilaterally. Both of the posterior cerebral arteries arise from the basilar arteries and are well opacified to their distal aspects. Small right posterior communicating artery noted. No aneurysm or vascular malformation. IMPRESSION: MRI HEAD IMPRESSION: 1. No acute intracranial infarct or other process identified. 2. Stable mild nonspecific white matter disease. MRA HEAD IMPRESSION: Negative intracranial MRA. No large or proximal arterial branch occlusion. No high-grade or correctable stenosis. Electronically Signed   By: Jeannine Boga M.D.   On: 12/18/2015 06:59   Mr Jodene Nam Head/brain Wo Cm  12/18/2015  CLINICAL DATA:  Initial evaluation for acute headache with ataxia. EXAM: MRI HEAD WITHOUT CONTRAST MRA HEAD WITHOUT CONTRAST TECHNIQUE: Multiplanar, multiecho pulse sequences of the brain and surrounding structures were obtained without intravenous contrast. Angiographic images of the head were obtained using MRA technique without contrast. COMPARISON:  Prior CT from 12/17/2015. FINDINGS: MRI HEAD FINDINGS Age appropriate cerebral atrophy present. Few scattered T2/FLAIR hyperintense foci noted within the periventricular, deep, and subcortical white matter of both cerebral hemispheres, nonspecific, but most likely related to mild chronic small vessel ischemic disease. These foci are stable  relative to previous exams. No abnormal foci of restricted diffusion to suggest acute intracranial infarct. Gray-white matter differentiation maintained. Major intracranial vascular flow voids are preserved. No areas of chronic infarction. No acute or chronic intracranial hemorrhage. No mass lesion, midline shift, or mass effect. No hydrocephalus. No extra-axial fluid collection. Major dural sinuses are grossly patent. Craniocervical junction normal. Visualized upper cervical spine unremarkable. Pituitary gland within normal limits. No acute abnormality about the orbits. Mild scattered mucosal thickening within the paranasal sinuses. No air-fluid levels to suggest active sinus infection. No mastoid effusion. Inner ear structures grossly normal. Bone marrow signal intensity within normal limits. No scalp soft tissue abnormality. MRA HEAD FINDINGS ANTERIOR CIRCULATION: Visualized distal cervical segments of the internal carotid arteries are widely patent with antegrade flow. Petrous, cavernous, and supraclinoid segments widely patent bilaterally without flow-limiting stenosis. A1 segments patent. Anterior communicating artery normal. Anterior cerebral arteries well opacified. M1 segments patent without stenosis or occlusion. MCA bifurcations normal. MCA branches well opacified and fairly symmetric. POSTERIOR CIRCULATION: Vertebral arteries largely code dominant and patent to the vertebrobasilar junction. Posterior inferior cerebral arteries patent bilaterally. Basilar artery widely patent to its distal aspect. Basilar tip is ectatic without frank aneurysm. Superior cerebral arteries patent bilaterally. Both of the posterior cerebral arteries arise from  the basilar arteries and are well opacified to their distal aspects. Small right posterior communicating artery noted. No aneurysm or vascular malformation. IMPRESSION: MRI HEAD IMPRESSION: 1. No acute intracranial infarct or other process identified. 2. Stable mild  nonspecific white matter disease. MRA HEAD IMPRESSION: Negative intracranial MRA. No large or proximal arterial branch occlusion. No high-grade or correctable stenosis. Electronically Signed   By: Jeannine Boga M.D.   On: 12/18/2015 06:59        Scheduled Meds: . aspirin  300 mg Rectal Daily   Or  . aspirin  325 mg Oral Daily  . diphenhydrAMINE  25 mg Intravenous Once  . enoxaparin (LOVENOX) injection  40 mg Subcutaneous Q24H  . FLUoxetine  60 mg Oral Daily  . fluticasone  2 spray Each Nare Daily  . loratadine  10 mg Oral Daily  . montelukast  10 mg Oral QHS  . prochlorperazine  10 mg Intravenous Once  . traZODone  200 mg Oral QHS   Continuous Infusions:        Time spent: 25 min    Dereka Lueras, MD [facility] Pager [phone]  If 7PM-7AM, please contact night-coverage [email or web address] Password [email or web address] 12/19/2015, 2:12 PM

## 2015-12-19 NOTE — Progress Notes (Signed)
Inpatient Rehabilitation  Dr. Letta Pate has completed the IP Rehab consult and has recommended [facility] therapy as pt. does not have a rehab diagnosis.  Dr. Letta Pate' consult can be found under the " incomplete" tab ( unsure why note does not show up as compete but did discuss with Dr. Letta Pate and he stated the note is complete).  Updated Jacqualin Combes RNCM of the recommendations from  the consult.  I will sign off.    [place] Admissions Coordinator Cell [phone] Office [phone]

## 2015-12-19 NOTE — Progress Notes (Signed)
Physical Therapy Treatment Patient Details Name: Dorothy White MRN: [id number] DOB: White 03, 1952 Today's Date: 12/19/2015    History of Present Illness Dorothy White is a 65 y.o. female with medical history significant of depression and migraine presents to the ER with increasing difficulty walking. Patient states she has been losing her balance when she tries to walk. Later the patient also started developing frontal headache which is not typical of her migraine.    PT Comments    Patient is making progress toward mobility with pt reporting improved R side strength. Pt continues to demonstrate unsteadiness with ambulation and with DGI score of 16 indicative of risk for falls and continues to report dizziness with all OOB mobility. Continue to recommend CIR for further skilled PT services to maximize independence and safety with mobility.    Follow Up Recommendations  CIR     Equipment Recommendations  Rolling walker with 5" wheels    Recommendations for Other Services Rehab consult     Precautions / Restrictions Precautions Precautions: Fall Restrictions Weight Bearing Restrictions: No    Mobility  Bed Mobility               General bed mobility comments: OOB in chair upon arrival  Transfers Overall transfer level: Needs assistance Equipment used: Rolling walker (2 wheeled);None Transfers: Sit to/from Stand Sit to Stand: Min guard         General transfer comment: mn guard for safety; a little unsteady upon standing but able to recover without assist  Ambulation/Gait Ambulation/Gait assistance: Min guard;Min assist Ambulation Distance (Feet): 250 Feet (150 with HHA, 100 with RW) Assistive device: Rolling walker (2 wheeled);None Gait Pattern/deviations: Step-through pattern;Decreased stride length;Narrow base of support Gait velocity: decreased   General Gait Details: very guarded movements; pt demonstrated ataxic gait initially with HHA but improved  with distance although pt remained unsteady with HHA and required min A at times to gain balance; no unsteadiness noted with use of RW with cues for position of RW and attempts to widen BOS   Stairs            Wheelchair Mobility    Modified Rankin (Stroke Patients Only) Modified Rankin (Stroke Patients Only) Pre-Morbid Rankin Score: No symptoms Modified Rankin: Moderate disability     Balance   Sitting-balance support: No upper extremity supported;Feet supported Sitting balance-Leahy Scale: Good     Standing balance support: Single extremity supported;During functional activity Standing balance-Leahy Scale: Fair                      Cognition Arousal/Alertness: Awake/alert Behavior During Therapy: Flat affect Overall Cognitive Status: Within Functional Limits for tasks assessed                      Exercises      General Comments        Pertinent Vitals/Pain Pain Assessment: 0-10 Pain Score: 6  Pain Location: head Pain Descriptors / Indicators: Headache Pain Intervention(s): Monitored during session;Premedicated before session    Home Living                      Prior Function            PT Goals (current goals can now be found in the care plan section) Acute Rehab PT Goals Patient Stated Goal: get stronger PT Goal Formulation: With patient Time For Goal Achievement: 12/25/15 Potential to Achieve Goals: Good Progress towards PT goals:  Progressing toward goals    Frequency  Min 4X/week    PT Plan Current plan remains appropriate    Co-evaluation             End of Session Equipment Utilized During Treatment: Gait belt Activity Tolerance: Patient tolerated treatment well Patient left: in chair;with call bell/phone within reach;with chair alarm set;with nursing/sitter in room     Time: [id number] PT Time Calculation (min) (ACUTE ONLY): 36 min  Charges:  $Gait Training: 8-22 mins $Therapeutic Activity: 8-22  mins                    G Codes:  Functional Assessment Tool Used: clinical judgement Functional Limitation: Mobility: Walking and moving around Mobility: Walking and Moving Around Current Status [phone]): At least 20 percent but less than 40 percent impaired, limited or restricted Mobility: Walking and Moving Around Goal Status [phone]): At least 1 percent but less than 20 percent impaired, limited or restricted   Dorothy White, PTA Pager: [phone]   12/19/2015, 10:36 AM

## 2015-12-20 ENCOUNTER — Observation Stay (HOSPITAL_COMMUNITY): Payer: BLUE CROSS/BLUE SHIELD

## 2015-12-20 DIAGNOSIS — R269 Unspecified abnormalities of gait and mobility: Secondary | ICD-10-CM | POA: Diagnosis not present

## 2015-12-20 DIAGNOSIS — R270 Ataxia, unspecified: Secondary | ICD-10-CM | POA: Diagnosis not present

## 2015-12-20 DIAGNOSIS — G43909 Migraine, unspecified, not intractable, without status migrainosus: Secondary | ICD-10-CM | POA: Diagnosis not present

## 2015-12-20 NOTE — Progress Notes (Signed)
Physical Therapy Treatment Patient Details Name: Dorothy White MRN: [id number] DOB: 1951-06-28 Today's Date: 12/20/2015    History of Present Illness Dorothy White is a 65 y.o. female with medical history significant of depression and migraine presents to the ER with increasing difficulty walking. Patient states she has been losing her balance when she tries to walk. Later the patient also started developing frontal headache which is not typical of her migraine.    PT Comments    Patient is making progress toward mobility goals and demonstrated improved balance and strength in R side. Pt is currently min guard/supervision for all OOB mobility including stair training. Due to pt's current mobility level, recommending HHPT for further skilled PT services to maximize independence and safety with mobility.   Follow Up Recommendations  Home health PT     Equipment Recommendations  Rolling walker with 5" wheels    Recommendations for Other Services Rehab consult     Precautions / Restrictions Precautions Precautions: Fall Restrictions Weight Bearing Restrictions: No    Mobility  Bed Mobility Overal bed mobility: Modified Independent Bed Mobility: Supine to Sit     Supine to sit: Supervision Sit to supine: Supervision   General bed mobility comments: increased time needed  Transfers Overall transfer level: Needs assistance Equipment used: Rolling walker (2 wheeled);None Transfers: Sit to/from Stand Sit to Stand: Supervision         General transfer comment: supervision for safety; from EOB and recliner; no unsteadiness upon standing and safe hand placement  Ambulation/Gait Ambulation/Gait assistance: Supervision;Min guard Ambulation Distance (Feet): 300 Feet Assistive device: Rolling walker (2 wheeled);None Gait Pattern/deviations: Step-through pattern;Decreased stride length Gait velocity: decreased   General Gait Details: pt with increased cadence and  improved gait mechanics this session; grossly supervision for safety and min guard when turning; pt demo'd LOB X1 with horizontal head turns    Stairs Stairs: Yes Stairs assistance: Min guard Stair Management: One rail Right;Forwards;Sideways Number of Stairs: 10 General stair comments: educated on sequencing and technique; pt felt more comfortable descending sideways  Wheelchair Mobility    Modified Rankin (Stroke Patients Only) Modified Rankin (Stroke Patients Only) Pre-Morbid Rankin Score: No symptoms Modified Rankin: Moderate disability     Balance Overall balance assessment: Needs assistance Sitting-balance support: Feet supported;No upper extremity supported Sitting balance-Leahy Scale: Good     Standing balance support: No upper extremity supported;During functional activity Standing balance-Leahy Scale: Fair Standing balance comment: fair dynamic standing balance. seeking external support at times for dynamic standing activity.                     Cognition Arousal/Alertness: Awake/alert Behavior During Therapy: Flat affect Overall Cognitive Status: Within Functional Limits for tasks assessed                      Exercises      General Comments        Pertinent Vitals/Pain Pain Assessment: No/denies pain    Home Living                      Prior Function            PT Goals (current goals can now be found in the care plan section) Acute Rehab PT Goals Patient Stated Goal: get stronger PT Goal Formulation: With patient Time For Goal Achievement: 12/25/15 Potential to Achieve Goals: Good Progress towards PT goals: Progressing toward goals    Frequency  Min  4X/week    PT Plan Discharge plan needs to be updated    Co-evaluation             End of Session Equipment Utilized During Treatment: Gait belt Activity Tolerance: Patient tolerated treatment well Patient left: in chair;with call bell/phone within reach;with  chair alarm set     Time: [id number] PT Time Calculation (min) (ACUTE ONLY): 28 min  Charges:  $Gait Training: 8-22 mins $Therapeutic Activity: 8-22 mins                    G Codes:  Functional Assessment Tool Used: clinical judgement Functional Limitation: Mobility: Walking and moving around Mobility: Walking and Moving Around Current Status [phone]): At least 20 percent but less than 40 percent impaired, limited or restricted Mobility: Walking and Moving Around Goal Status [phone]): At least 1 percent but less than 20 percent impaired, limited or restricted   [name], PTA Pager: [phone]   12/20/2015, 2:51 PM

## 2015-12-20 NOTE — Progress Notes (Signed)
Occupational Therapy Treatment Patient Details Name: Dorothy White MRN: [id number] DOB: [birth year] Today's Date: 12/20/2015    History of present illness Dorothy White is a 65 y.o. female with medical history significant of depression and migraine presents to the ER with increasing difficulty walking. Patient states she has been losing her balance when she tries to walk. Later the patient also started developing frontal headache which is not typical of her migraine.   OT comments  Pt progressing towards acute OT goals. Focus of session was toilet transfer and balance during functional dynamic standing activity. Pt was supervision- min guard for OOB/mobility. Able to manipulate containers with both hands during functional activity. Updating d/c recommendation to [place]. Pt reports she would like to go home at d/c.   Follow Up Recommendations  Home health OT;Supervision - Intermittent    Equipment Recommendations  3 in 1 bedside comode    Recommendations for Other Services      Precautions / Restrictions Precautions Precautions: Fall Restrictions Weight Bearing Restrictions: No       Mobility Bed Mobility Overal bed mobility: Needs Assistance Bed Mobility: Supine to Sit;Sit to Supine     Supine to sit: Supervision Sit to supine: Supervision      Transfers Overall transfer level: Needs assistance Equipment used: Rolling walker (2 wheeled);None Transfers: Sit to/from Stand Sit to Stand: Min guard         General transfer comment: min guard for safety. from EOB and toilet.    Balance Overall balance assessment: Needs assistance Sitting-balance support: Feet supported;No upper extremity supported Sitting balance-Leahy Scale: Good     Standing balance support: Bilateral upper extremity supported;No upper extremity supported;During functional activity Standing balance-Leahy Scale: Fair Standing balance comment: fair dynamic standing balance. seeking external  support at times for dynamic standing activity.                    ADL Overall ADL's : Needs assistance/impaired       Grooming Details (indicate cue type and reason): able to open and close/manipulate container screw top lids with right and left hands with no difficulty                 Toilet Transfer: Min guard;Ambulation;Regular Glass blower/designer Details (indicate cue type and reason): rw present and utilized some but likely not needed if grab bar present         Functional mobility during ADLs: Min guard General ADL Comments: Pt completed in-room functional mobility to/from bathroom, toilet transfer completed as detailed above. Pt completed dynamic standing activites in overhead and ground level positions (moved towels up high and down low in closet). Encouraged pt to have wider BOS during dynamice activities and ambulation. Discussed HEP for BUE strengthening. Continued to recommend to pt a f/u with opthamologist as she reports continued vision deficits compared to baseline.       Vision                     Perception     Praxis      Cognition   Behavior During Therapy: [facility] for tasks assessed/performed Overall Cognitive Status: Within Functional Limits for tasks assessed                       Extremity/Trunk Assessment               Exercises     Shoulder Instructions  General Comments      Pertinent Vitals/ Pain       Pain Assessment: No/denies pain  Home Living                                          Prior Functioning/Environment              Frequency Min 2X/week     Progress Toward Goals  OT Goals(current goals can now be found in the care plan section)  Progress towards OT goals: Progressing toward goals  Acute Rehab OT Goals Patient Stated Goal: get stronger; go home OT Goal Formulation: With patient Time For Goal Achievement: 12/25/15 Potential to Achieve Goals: Good ADL  Goals Pt Will Perform Lower Body Dressing: sit to/from stand;with supervision Pt Will Transfer to Toilet: with modified independence;ambulating;regular height toilet Pt Will Perform Toileting - Clothing Manipulation and hygiene: with modified independence;sit to/from stand Additional ADL Goal #1: Pt will independently perform HEP for bilateral UEs to increase strength. Additional ADL Goal #2: Pt will independently perform HEP for bilateral UEs to increase coordination.  Plan Discharge plan needs to be updated    Co-evaluation                 End of Session Equipment Utilized During Treatment: Rolling walker   Activity Tolerance Patient tolerated treatment well   Patient Left in bed;with bed alarm set;with call bell/phone within reach   Nurse Communication      Functional Assessment Tool Used: clinical judgment Functional Limitation: Self care Self Care Current Status [id number]): At least 1 percent but less than 20 percent impaired, limited or restricted Self Care Goal Status [id number]): At least 1 percent but less than 20 percent impaired, limited or restricted   Time: 1118-1140 OT Time Calculation (min): 22 min  Charges: OT G-codes **NOT FOR INPATIENT CLASS** Functional Assessment Tool Used: clinical judgment Functional Limitation: Self care Self Care Current Status [id number]): At least 1 percent but less than 20 percent impaired, limited or restricted Self Care Goal Status [id number]): At least 1 percent but less than 20 percent impaired, limited or restricted OT General Charges $OT Visit: 1 Procedure OT Treatments $Self Care/Home Management : 8-22 mins  Hortencia Pilar 12/20/2015, 1:14 PM

## 2015-12-20 NOTE — Progress Notes (Signed)
PROGRESS NOTE    Dorothy White  [id number] DOB: [birth year] DOA: 12/17/2015 PCP: Jonathon Bellows, MD     Brief Narrative:  Dorothy White is a 65 y.o. female with  history significant of depression and migraine presents to the ER with ataxia, CT head with no intracranial abnormality, subsequent MRI and MRA were unremarkable, and by neurology, been evaluated for complicated migraine.  Assessment & Plan:   Principal Problem:   Ataxia Active Problems:   Right sided weakness   Complicated migraine - Neurology consult greatly appreciated, suspicious for complicated migraine, MRI brain, MRA head with no acute findings, neurology will repeat MRI brain today. - MRI C-spine, MRA neck significant for midcervical internal carotid artery stenosis bilaterally most consistent with fibromuscular dysplasia, discussed with neurology, no further workup indicated. - PT appreciated, seen by CIR, not a candidate for CIR admission as does not have rehabilitation diagnosis -denies alcohol- GGT - 2-D echo with EF 40-45%, and inferior hypokinesis, he denies any chest pain or shortness of breath, will need cardiology follow-up as an outpatient, no beta blockers or ACE inhibitor giving soft blood pressure.  Ataxia - Please see above, improving  History of depression - Continue with home medication   DVT prophylaxis:  Lovenox   Code Status: Full Code   Family Communication: None at bedside  Disposition Plan:  Pending repeat MRI brain   Consultants:   Neurology  Procedures:   None  Antimicrobials:   None   Subjective: Complains of headache, reports ataxia is improving  Objective: Filed Vitals:   12/19/15 1434 12/19/15 1835 12/19/15 2130 12/20/15 0530  BP: 95/46 114/40 112/43 127/49  Pulse: 67 70 63 52  Temp: 98.9 F (37.2 C) 98.5 F (36.9 C) 98.2 F (36.8 C) 97.8 F (36.6 C)  TempSrc: Oral Oral Oral Oral  Resp: 18 18 18 16   Height:      Weight:      SpO2:  99% 97% 97% 100%   No intake or output data in the 24 hours ending 12/20/15 1150 Filed Weights   12/17/15 2056 12/18/15 0248  Weight: 58.968 kg (130 lb) 58.968 kg (130 lb)    Examination:  General exam: Appears calm and comfortable  Respiratory system: Clear to auscultation. Respiratory effort normal. Cardiovascular system: S1 & S2 heard, RRR. No JVD, murmurs, rubs, gallops or clicks. No pedal edema. Gastrointestinal system: Abdomen is nondistended, soft and nontender. No organomegaly or masses felt. Normal bowel sounds heard. Extremities: No edema, clubbing or cyanosis    Data Reviewed: I have personally reviewed following labs and imaging studies  CBC:  Recent Labs Lab 12/17/15 2112 12/17/15 2122 12/18/15 0510 12/19/15 0240  WBC 6.5  --  5.4 4.5  NEUTROABS 4.0  --   --   --   HGB 11.9* 12.9 11.2* 11.1*  HCT 36.5 38.0 36.8 36.5  MCV 87.7  --  92.2 92.2  PLT 199  --  192 [id number]   Basic Metabolic Panel:  Recent Labs Lab 12/17/15 2112 12/17/15 2122 12/18/15 0510 12/19/15 0240  NA 139 143 143 141  K 3.5 3.5 4.2 4.5  CL 109 104 109 109  CO2 23  --  28 28  GLUCOSE 98 96 90 86  BUN 12 11 7 11   CREATININE 0.57 0.50 0.59 0.66  CALCIUM 9.0  --  9.1 8.2*   GFR: Estimated Creatinine Clearance: 63.9 mL/min (by C-G formula based on Cr of 0.66). Liver Function Tests:  Recent Labs Lab 12/17/15 2112  12/18/15 0510  AST 18 14*  ALT 18 16  ALKPHOS 85 78  BILITOT 0.6 0.7  PROT 6.0* 5.8*  ALBUMIN 3.5 3.1*   No results for input(s): LIPASE, AMYLASE in the last 168 hours.  Recent Labs Lab 12/18/15 0510  AMMONIA 20   Coagulation Profile:  Recent Labs Lab 12/17/15 2112  INR 1.03   Cardiac Enzymes: No results for input(s): CKTOTAL, CKMB, CKMBINDEX, TROPONINI in the last 168 hours. BNP (last 3 results) No results for input(s): PROBNP in the last 8760 hours. HbA1C:  Recent Labs  12/18/15 0510  HGBA1C 5.2   CBG: No results for input(s): GLUCAP in the last  168 hours. Lipid Profile:  Recent Labs  12/18/15 0510  CHOL 156  HDL 47  LDLCALC 89  TRIG 100  CHOLHDL 3.3   Thyroid Function Tests: No results for input(s): TSH, T4TOTAL, FREET4, T3FREE, THYROIDAB in the last 72 hours. Anemia Panel: No results for input(s): VITAMINB12, FOLATE, FERRITIN, TIBC, IRON, RETICCTPCT in the last 72 hours. Urine analysis:    Component Value Date/Time   COLORURINE YELLOW 12/17/2015 2218   [place] 12/17/2015 2218   LABSPEC 1.009 12/17/2015 2218   PHURINE 6.5 12/17/2015 2218   GLUCOSEU NEGATIVE 12/17/2015 2218   GLUCOSEU NEGATIVE 04/13/2007 1417   HGBUR NEGATIVE 12/17/2015 2218   BILIRUBINUR NEGATIVE 12/17/2015 2218   KETONESUR NEGATIVE 12/17/2015 2218   PROTEINUR NEGATIVE 12/17/2015 2218   UROBILINOGEN 0.2 02/04/2015 1800   NITRITE NEGATIVE 12/17/2015 2218   LEUKOCYTESUR NEGATIVE 12/17/2015 2218     )No results found for this or any previous visit (from the past 240 hour(s)).    Anti-infectives    None       Radiology Studies: Mr Angiogram Neck W Wo Contrast  12/19/2015  CLINICAL DATA:  Unsteady gait.  Falling to the right. EXAM: MRA NECK WITHOUT AND WITH CONTRAST TECHNIQUE: Multiplanar and multiecho pulse sequences of the neck were obtained without and with intravenous contrast. Angiographic images of the neck were obtained using MRA technique without and with intravenous contrast. CONTRAST:  43mL MULTIHANCE GADOBENATE DIMEGLUMINE 529 MG/ML IV SOLN COMPARISON:  MRI brain and MRA head 12/18/2014 FINDINGS: Time-of-flight images demonstrate no significant flow disturbance at either carotid bifurcation. There is antegrade flow within the vertebral arteries bilaterally. A 3 vessel arch configuration is present without significant proximal stenosis of the great vessels. The right common carotid artery is within normal limits. The bifurcation is unremarkable. There is focal irregularity in the midcervical segment without significant stenosis.  The more distal vessel is within normal limits. Mild cavernous internal carotid artery atherosclerotic disease is present without significant stenosis. The left common carotid artery is within normal limits. Minimal irregularity is present at the left carotid bifurcation. There is no significant stenosis. Focal irregularity is present in the mid cervical left ICA without significant stenosis. Minimal atherosclerotic changes are present within the cavernous left internal carotid artery. Both vertebral arteries originate from the subclavian arteries. The vertebral arteries are codominant. The PICA origins are visualized and normal. The basilar artery is within normal limits. IMPRESSION: 1. Focal irregularity without significant stenosis in the mid cervical internal carotid artery bilaterally. This is most consistent with fibromuscular dysplasia. 2. Minimal atherosclerotic changes in the cavernous internal carotid arteries bilaterally without significant stenosis. Electronically Signed   By: San Morelle M.D.   On: 12/19/2015 21:20   Mr Cervical Spine Wo Contrast  12/19/2015  CLINICAL DATA:  Unsteady gait and falling to the right. EXAM: MRI CERVICAL SPINE  WITHOUT CONTRAST TECHNIQUE: Multiplanar, multisequence MR imaging of the cervical spine was performed. No intravenous contrast was administered. COMPARISON:  MRI brain and MRA head 12/18/2015 FINDINGS: Alignment: Slight retrolisthesis is present at C6-7. Alignment is otherwise anatomic. Vertebrae: A hemangioma is present anteriorly at CT. There is mild fatty infiltration of the marrow space. Mild endplate marrow changes are present at C6-7. Vertebral body heights are maintained. Cord: Normal signal is present in the cervical and upper thoracic spinal cord to the lowest imaged level, T2-3. Posterior Fossa, vertebral arteries, paraspinal tissues: The craniocervical junction is within normal limits. The visualized intracranial contents are normal. Flow is  present in the vertebral arteries bilaterally. Disc levels: C2-3: Mild facet hypertrophy is worse on the right. There is no significant stenosis. C3-4: A mild rightward disc osteophyte complex is present. Asymmetric right-sided facet hypertrophy is noted as well. There is no significant stenosis. C4-5: Asymmetric right-sided facet hypertrophy is present. There is no significant stenosis. C5-6: A leftward disc osteophyte complex partially effaces the ventral CSF. Mild left foraminal narrowing is evident. C6-7: A broad-based disc osteophyte complex effaces the ventral CSF. Moderate left and mild right foraminal stenosis is present. C7-T1:  Negative. IMPRESSION: 1. The most significant level is C6-7 with moderate left central and left foraminal stenosis. 2. Mild right foraminal narrowing at C6-7. 3. Leftward disc osteophyte complex with mild left central and left foraminal stenosis at C5-6. 4. Rightward disc osteophyte complex and asymmetric right-sided facet hypertrophy at C3-4 without significant stenosis. Electronically Signed   By: San Morelle M.D.   On: 12/19/2015 21:16        Scheduled Meds: . aspirin  300 mg Rectal Daily   Or  . aspirin  325 mg Oral Daily  . diphenhydrAMINE  25 mg Intravenous Once  . enoxaparin (LOVENOX) injection  40 mg Subcutaneous Q24H  . FLUoxetine  60 mg Oral Daily  . fluticasone  2 spray Each Nare Daily  . ketorolac  30 mg Intravenous Q6H  . loratadine  10 mg Oral Daily  . montelukast  10 mg Oral QHS  . prochlorperazine  10 mg Intravenous Once  . traZODone  200 mg Oral QHS   Continuous Infusions:        Time spent: 20 min    Hasten Sweitzer, MD [facility] Pager [phone]  If 7PM-7AM, please contact night-coverage [email or web address] Password [email or web address] 12/20/2015, 11:50 AM

## 2015-12-20 NOTE — Progress Notes (Signed)
Pt ambulates standby assist by staff. Pt denied discomfort. Gait steady. Will continue to monitor.

## 2015-12-20 NOTE — Consult Note (Signed)
Physical Medicine and Rehabilitation Consult Reason for Consult: Ataxia Referring Physician: [facility]   HPI: Dorothy White is a 65 y.o. right handed female with history of TIA in December 2014, migraine headaches, chronic back pain, tobacco abuse. By chart review patient lives alone independent prior to admission. He has a friend who lives in the upstairs of his home but cannot assist. Presented 12/17/2015 with unstable gait and increasing difficulty walking as well as frontal headache. Denied any nausea vomiting. Cranial CT scan negative. MRI/MRA negative for acute intracranial process. No large or proximal arterial branch occlusion. Chest x-ray consistent with emphysema. Echocardiogram with ejection fraction of 45% moderate hypokinesis. Carotid Dopplers with no ICA stenosis. Neurology consulted with workup presently ongoing. Aspirin has been added for CVA prophylaxis. Subcutaneous Lovenox for DVT prophylaxis. Maintain on a regular diet. Physical and occupational therapy evaluations completed 12/18/2015 with recommendations of physical medicine rehabilitation consult.  Discussed functional status with physical therapy.Patient has unsteady gait with fear of falling but no overt signs of weakness.  Review of Systems  Constitutional: Negative for fever and chills.  HENT: Negative for hearing loss.   Eyes: Negative for blurred vision and double vision.  Gastrointestinal: Positive for constipation. Negative for nausea and vomiting.       GERD  Genitourinary: Negative for dysuria and hematuria.  Musculoskeletal: Positive for myalgias.  Neurological: Positive for dizziness and headaches. Negative for seizures and weakness.  Psychiatric/Behavioral: Positive for depression.       Anxiety  All other systems reviewed and are negative.  Past Medical History  Diagnosis Date  . Arrhythmia   . Diverticulitis   . Depression   . Anxiety   . HLD (hyperlipidemia)   . Chronic back pain   .  Stroke [facility])     "mini" stroke - 06/2013  . Headache(784.0)     Migraines  . GERD (gastroesophageal reflux disease)   . Arthritis   . Cancer ([place])     basal cell skin cancer  . Anemia   . Constipation    Past Surgical History  Procedure Laterality Date  . Tonsillectomy    . Abdominal hysterectomy    . Breast enhancement surgery    . Rhinoplasty    . Oophorectomy Bilateral   . Bunionectomy Bilateral   . Colonoscopy    . Orif humerus fracture Right 02/15/2015    Procedure: OPEN REDUCTION INTERNAL FIXATION (ORIF) PROXIMAL HUMERUS FRACTURE;  Surgeon: Netta Cedars, MD;  Location: [facility];  Service: Orthopedics;  Laterality: Right;   Family History  Problem Relation Age of Onset  . Lung cancer Mother   . Heart attack Father   . Hypertension Brother   . Esophageal cancer Brother   . Colon cancer Neg Hx   . Diabetes Paternal Grandmother     questionable  . Kidney disease Neg Hx   . Liver disease Neg Hx    Social History:  reports that she has been smoking Cigarettes.  She has never used smokeless tobacco. She reports that she does not drink alcohol or use illicit drugs. Allergies:  Allergies  Allergen Reactions  . Penicillins Hives       Medications Prior to Admission  Medication Sig Dispense Refill  . bismuth subsalicylate (PEPTO BISMOL) 262 MG chewable tablet Chew 524 mg by mouth as needed for indigestion.    Marland Kitchen buPROPion (WELLBUTRIN XL) 150 MG 24 hr tablet Take 150 mg by mouth daily.    Marland Kitchen buPROPion (WELLBUTRIN XL) 300 MG  24 hr tablet Take 300 mg by mouth daily.   0  . calcium-vitamin D (OSCAL WITH D) 500-200 MG-UNIT per tablet Take 1 tablet by mouth daily with breakfast.    . clonazePAM (KLONOPIN) 1 MG tablet Take 1 mg by mouth at bedtime as needed (anxiety/sleep.).   3  . FLUoxetine (PROZAC) 20 MG capsule Take 60 mg by mouth daily.     Marland Kitchen HYDROcodone-acetaminophen (NORCO/VICODIN) 5-325 MG tablet Take 1-2 tablets by mouth every 6 hours as needed for pain and/or cough. 7 tablet  0  . levocetirizine (XYZAL ALLERGY 24HR) 5 MG tablet Take 5 mg by mouth every evening.    . mometasone (NASONEX) 50 MCG/ACT nasal spray Place 2 sprays into the nose daily.  11  . montelukast (SINGULAIR) 10 MG tablet Take 10 mg by mouth daily.     Marland Kitchen oxyCODONE-acetaminophen (PERCOCET) 10-325 MG tablet Take 1 tablet by mouth 4 (four) times daily as needed. Pain.  0  . RELPAX 40 MG tablet Take 40 mg by mouth every 2 (two) hours as needed for migraine.   11  . traZODone (DESYREL) 100 MG tablet Take 200 mg by mouth at bedtime.     . cephALEXin (KEFLEX) 500 MG capsule Take 1 capsule (500 mg total) by mouth 4 (four) times daily. 20 capsule 0    Home: Home Living Family/patient expects to be discharged to:: Private residence Living Arrangements: Alone (has a friend who lives upstairs but isn't home much) Available Help at Discharge: Friend(s), Available PRN/intermittently Type of Home: House Home Access: Level entry Home Layout: Two level, Able to live on main level with bedroom/bathroom Alternate Level Stairs-Number of Steps: flight (pt rarely goes up stairs) Alternate Level Stairs-Rails: Right Bathroom Shower/Tub: Chiropodist: Standard Home Equipment: Cane - single point  Functional History: Prior Function Level of Independence: Independent Functional Status:  Mobility: Bed Mobility Overal bed mobility: Needs Assistance Bed Mobility: Supine to Sit, Sit to Supine Supine to sit: Supervision Sit to supine: Supervision General bed mobility comments: increased time, labored effort, HOB elevated with definite use of bed rail Transfers Overall transfer level: Needs assistance Equipment used: Rolling walker (2 wheeled), None Transfers: Sit to/from Stand Sit to Stand: Min guard General transfer comment: v/c's to push up from bed, increased time, min guard to steady pt in standing Ambulation/Gait Ambulation/Gait assistance: Min assist Ambulation Distance (Feet): 120  Feet Assistive device: Rolling walker (2 wheeled), None Gait Pattern/deviations: Step-through pattern, Decreased stride length, Staggering right, Wide base of support General Gait Details: pt very guarded, attempted to amb with RW however pt with short shuffled staggered steps reaching for objects to hold onto. pt given RW. pt with improved stability and increased step length with minimal shakiness Gait velocity: decreased Gait velocity interpretation: Below normal speed for age/gender    ADL: ADL Overall ADL's : Needs assistance/impaired Lower Body Dressing: Min guard, Sit to/from stand Toilet Transfer: Min guard, Ambulation, Regular Toilet Toileting- Clothing Manipulation and Hygiene: Min guard, Sit to/from stand Functional mobility during ADLs: Min guard  Cognition: Cognition Overall Cognitive Status: Within Functional Limits for tasks assessed Orientation Level: Oriented X4 Cognition Arousal/Alertness: Awake/alert Behavior During Therapy: WFL for tasks assessed/performed, Anxious Overall Cognitive Status: Within Functional Limits for tasks assessed  Blood pressure 131/63, pulse 64, temperature 97.4 F (36.3 C), temperature source Oral, resp. rate 18, height 5\' 5"  (1.651 m), weight 58.968 kg (130 lb), SpO2 99 %. Physical Exam  Vitals reviewed. Constitutional: She appears well-developed.  HENT:  Head: Normocephalic.  Eyes: EOM are normal.  Neck: Normal range of motion. Neck supple. No thyromegaly present.  Cardiovascular: Normal rate and regular rhythm.   Respiratory: Effort normal and breath sounds normal. No respiratory distress.  GI: Soft. Bowel sounds are normal. She exhibits no distension.  Neurological: She is alert.  Patient is a bit anxious but cooperative with exam. Oriented to person place and time. Follows commands.  Skin: Skin is warm and dry.  Romberg is negative Motor strength is 5/5 bilateralBiceps triceps and grip, right deltoid is 3 minus related to  shoulder pain, left deltoid is 5/5 5/5 bilateral hip flexor and knee extensor and ankle dorsiflexor. Normal sensation to light touch bilateral upper and lower limbs Ambulates  With a slightly widened base of support, she can ambulate with supervision without device. With device she needs distant supervision but could be mod I  Results for orders placed or performed during the hospital encounter of 12/17/15 (from the past 24 hour(s))  Gamma GT     Status: None   Collection Time: 12/19/15  2:40 AM  Result Value Ref Range   GGT 17 7 - 50 U/L  CBC     Status: Abnormal   Collection Time: 12/19/15  2:40 AM  Result Value Ref Range   WBC 4.5 4.0 - 10.5 K/uL   RBC 3.96 3.87 - 5.11 MIL/uL   Hemoglobin 11.1 (L) 12.0 - 15.0 g/dL   HCT 36.5 36.0 - 46.0 %   MCV 92.2 78.0 - 100.0 fL   MCH 28.0 26.0 - 34.0 pg   MCHC 30.4 30.0 - 36.0 g/dL   RDW 14.4 11.5 - 15.5 %   Platelets 194 150 - 400 K/uL  Basic metabolic panel     Status: Abnormal   Collection Time: 12/19/15  2:40 AM  Result Value Ref Range   Sodium 141 135 - 145 mmol/L   Potassium 4.5 3.5 - 5.1 mmol/L   Chloride 109 101 - 111 mmol/L   CO2 28 22 - 32 mmol/L   Glucose, Bld 86 65 - 99 mg/dL   BUN 11 6 - 20 mg/dL   Creatinine, Ser 0.66 0.44 - 1.00 mg/dL   Calcium 8.2 (L) 8.9 - 10.3 mg/dL   GFR calc non Af Amer >60 >60 mL/min   GFR calc Af Amer >60 >60 mL/min   Anion gap 4 (L) 5 - 15   Dg Chest 2 View  12/18/2015  CLINICAL DATA:  Difficulty walking, dizziness, weakness, smoking history EXAM: CHEST  2 VIEW COMPARISON:  Chest x-ray of 09/25/2015 FINDINGS: The lungs remain clear and somewhat hyperaerated. A small right pleural effusion cannot be excluded. Mediastinal and hilar contours are unremarkable. The heart is within normal limits in size. The bones are diffusely osteopenic. IMPRESSION: No change in hyper aeration consistent with emphysema. Cannot exclude a tiny right pleural effusion. Electronically Signed   By: Ivar Drape M.D.   On:  12/18/2015 08:13   Ct Head Wo Contrast  12/17/2015  CLINICAL DATA:  Right-sided weakness.  Headache and dizziness. EXAM: CT HEAD WITHOUT CONTRAST TECHNIQUE: Contiguous axial images were obtained from the base of the skull through the vertex without intravenous contrast. COMPARISON:  Head CT 07/09/2015 FINDINGS: No intracranial hemorrhage, mass effect, or midline shift. No hydrocephalus. The basilar cisterns are patent. No evidence of territorial infarct. Stable degree of chronic small vessel ischemia. No intracranial fluid collection. Calvarium is intact. Included paranasal sinuses and mastoid air cells are well aerated. IMPRESSION: No acute  intracranial abnormality. Stable mild chronic small vessel ischemia. Electronically Signed   By: Jeb Levering M.D.   On: 12/17/2015 23:06   Mr Brain Wo Contrast  12/18/2015  CLINICAL DATA:  Initial evaluation for acute headache with ataxia. EXAM: MRI HEAD WITHOUT CONTRAST MRA HEAD WITHOUT CONTRAST TECHNIQUE: Multiplanar, multiecho pulse sequences of the brain and surrounding structures were obtained without intravenous contrast. Angiographic images of the head were obtained using MRA technique without contrast. COMPARISON:  Prior CT from 12/17/2015. FINDINGS: MRI HEAD FINDINGS Age appropriate cerebral atrophy present. Few scattered T2/FLAIR hyperintense foci noted within the periventricular, deep, and subcortical white matter of both cerebral hemispheres, nonspecific, but most likely related to mild chronic small vessel ischemic disease. These foci are stable relative to previous exams. No abnormal foci of restricted diffusion to suggest acute intracranial infarct. Gray-white matter differentiation maintained. Major intracranial vascular flow voids are preserved. No areas of chronic infarction. No acute or chronic intracranial hemorrhage. No mass lesion, midline shift, or mass effect. No hydrocephalus. No extra-axial fluid collection. Major dural sinuses are grossly  patent. Craniocervical junction normal. Visualized upper cervical spine unremarkable. Pituitary gland within normal limits. No acute abnormality about the orbits. Mild scattered mucosal thickening within the paranasal sinuses. No air-fluid levels to suggest active sinus infection. No mastoid effusion. Inner ear structures grossly normal. Bone marrow signal intensity within normal limits. No scalp soft tissue abnormality. MRA HEAD FINDINGS ANTERIOR CIRCULATION: Visualized distal cervical segments of the internal carotid arteries are widely patent with antegrade flow. Petrous, cavernous, and supraclinoid segments widely patent bilaterally without flow-limiting stenosis. A1 segments patent. Anterior communicating artery normal. Anterior cerebral arteries well opacified. M1 segments patent without stenosis or occlusion. MCA bifurcations normal. MCA branches well opacified and fairly symmetric. POSTERIOR CIRCULATION: Vertebral arteries largely code dominant and patent to the vertebrobasilar junction. Posterior inferior cerebral arteries patent bilaterally. Basilar artery widely patent to its distal aspect. Basilar tip is ectatic without frank aneurysm. Superior cerebral arteries patent bilaterally. Both of the posterior cerebral arteries arise from the basilar arteries and are well opacified to their distal aspects. Small right posterior communicating artery noted. No aneurysm or vascular malformation. IMPRESSION: MRI HEAD IMPRESSION: 1. No acute intracranial infarct or other process identified. 2. Stable mild nonspecific white matter disease. MRA HEAD IMPRESSION: Negative intracranial MRA. No large or proximal arterial branch occlusion. No high-grade or correctable stenosis. Electronically Signed   By: Jeannine Boga M.D.   On: 12/18/2015 06:59   Mr Jodene Nam Head/brain Wo Cm  12/18/2015  CLINICAL DATA:  Initial evaluation for acute headache with ataxia. EXAM: MRI HEAD WITHOUT CONTRAST MRA HEAD WITHOUT CONTRAST  TECHNIQUE: Multiplanar, multiecho pulse sequences of the brain and surrounding structures were obtained without intravenous contrast. Angiographic images of the head were obtained using MRA technique without contrast. COMPARISON:  Prior CT from 12/17/2015. FINDINGS: MRI HEAD FINDINGS Age appropriate cerebral atrophy present. Few scattered T2/FLAIR hyperintense foci noted within the periventricular, deep, and subcortical white matter of both cerebral hemispheres, nonspecific, but most likely related to mild chronic small vessel ischemic disease. These foci are stable relative to previous exams. No abnormal foci of restricted diffusion to suggest acute intracranial infarct. Gray-white matter differentiation maintained. Major intracranial vascular flow voids are preserved. No areas of chronic infarction. No acute or chronic intracranial hemorrhage. No mass lesion, midline shift, or mass effect. No hydrocephalus. No extra-axial fluid collection. Major dural sinuses are grossly patent. Craniocervical junction normal. Visualized upper cervical spine unremarkable. Pituitary gland within normal limits. No acute  abnormality about the orbits. Mild scattered mucosal thickening within the paranasal sinuses. No air-fluid levels to suggest active sinus infection. No mastoid effusion. Inner ear structures grossly normal. Bone marrow signal intensity within normal limits. No scalp soft tissue abnormality. MRA HEAD FINDINGS ANTERIOR CIRCULATION: Visualized distal cervical segments of the internal carotid arteries are widely patent with antegrade flow. Petrous, cavernous, and supraclinoid segments widely patent bilaterally without flow-limiting stenosis. A1 segments patent. Anterior communicating artery normal. Anterior cerebral arteries well opacified. M1 segments patent without stenosis or occlusion. MCA bifurcations normal. MCA branches well opacified and fairly symmetric. POSTERIOR CIRCULATION: Vertebral arteries largely code  dominant and patent to the vertebrobasilar junction. Posterior inferior cerebral arteries patent bilaterally. Basilar artery widely patent to its distal aspect. Basilar tip is ectatic without frank aneurysm. Superior cerebral arteries patent bilaterally. Both of the posterior cerebral arteries arise from the basilar arteries and are well opacified to their distal aspects. Small right posterior communicating artery noted. No aneurysm or vascular malformation. IMPRESSION: MRI HEAD IMPRESSION: 1. No acute intracranial infarct or other process identified. 2. Stable mild nonspecific white matter disease. MRA HEAD IMPRESSION: Negative intracranial MRA. No large or proximal arterial branch occlusion. No high-grade or correctable stenosis. Electronically Signed   By: Jeannine Boga M.D.   On: 12/18/2015 06:59    Assessment/Plan: Diagnosis: Disorder with negative workup, possibly complicated migraine 1. Does the need for close, 24 hr/day medical supervision in concert with the patient's rehab needs make it unreasonable for this patient to be served in a less intensive setting? No 2. Co-Morbidities requiring supervision/potential complications: Not applicable 3. Due to medication administration and patient education, does the patient require 24 hr/day rehab nursing? No 4. Does the patient require coordinated care of a physician, rehab nurse, PT, OT to address physical and functional deficits in the context of the above medical diagnosis(es)? No Addressing deficits in the following areas: balance and locomotion 5. Can the patient actively participate in an intensive therapy program of at least 3 hrs of therapy per day at least 5 days per week? Potentially 6. The potential for patient to make measurable gains while on inpatient rehab is NA 7. Anticipated functional outcomes upon discharge from inpatient rehab are n/a  with PT, n/a with OT, n/a with SLP. 8. Estimated rehab length of stay to reach the above  functional goals is: not applicable 9. Does the patient have adequate social supports and living environment to accommodate these discharge functional goals? Yes 10. Anticipated D/C setting: Home 11. Anticipated post D/C treatments: [facility] therapy 12. Overall Rehab/Functional Prognosis: excellent  RECOMMENDATIONS: This patient's condition is appropriate for continued rehabilitative care in the following setting: [facility] Therapy Patient has agreed to participate in recommended program. Potentially Note that insurance prior authorization may be required for reimbursement for recommended care.  Comment:No rehabilitation diagnosis

## 2015-12-21 DIAGNOSIS — G43909 Migraine, unspecified, not intractable, without status migrainosus: Secondary | ICD-10-CM | POA: Diagnosis not present

## 2015-12-21 DIAGNOSIS — R270 Ataxia, unspecified: Secondary | ICD-10-CM | POA: Diagnosis not present

## 2015-12-21 MED ORDER — GABAPENTIN 600 MG PO TABS: 300.0000 mg | ORAL_TABLET | Freq: Every day | ORAL | Status: DC

## 2015-12-21 NOTE — Discharge Instructions (Signed)
Follow with Primary MD WEBB, CAROL D, MD in 7 days   Get CBC, CMP, 2 view Chest X ray checked  by Primary MD next visit.    Activity: As tolerated with Full fall precautions use walker/cane & assistance as needed   Disposition Home    Diet: Heart Healthy  .  For Heart failure patients - Check your Weight same time everyday, if you gain over 2 pounds, or you develop in leg swelling, experience more shortness of breath or chest pain, call your Primary MD immediately. Follow Cardiac Low Salt Diet and 1.5 lit/day fluid restriction.   On your next visit with your primary care physician please Get Medicines reviewed and adjusted.   Please request your Prim.MD to go over all Hospital Tests and Procedure/Radiological results at the follow up, please get all Hospital records sent to your Prim MD by signing hospital release before you go home.   If you experience worsening of your admission symptoms, develop shortness of breath, life threatening emergency, suicidal or homicidal thoughts you must seek medical attention immediately by calling 911 or calling your MD immediately  if symptoms less severe.  You Must read complete instructions/literature along with all the possible adverse reactions/side effects for all the Medicines you take and that have been prescribed to you. Take any new Medicines after you have completely understood and accpet all the possible adverse reactions/side effects.   Do not drive, operating heavy machinery, perform activities at heights, swimming or participation in water activities or provide baby sitting services if your were admitted for syncope or siezures until you have seen by Primary MD or a Neurologist and advised to do so again.  Do not drive when taking Pain medications.    Do not take more than prescribed Pain, Sleep and Anxiety Medications  Special Instructions: If you have smoked or chewed Tobacco  in the last 2 yrs please stop smoking, stop any regular  Alcohol  and or any Recreational drug use.  Wear Seat belts while driving.   Please note  You were cared for by a hospitalist during your hospital stay. If you have any questions about your discharge medications or the care you received while you were in the hospital after you are discharged, you can call the unit and asked to speak with the hospitalist on call if the hospitalist that took care of you is not available. Once you are discharged, your primary care physician will handle any further medical issues. Please note that NO REFILLS for any discharge medications will be authorized once you are discharged, as it is imperative that you return to your primary care physician (or establish a relationship with a primary care physician if you do not have one) for your aftercare needs so that they can reassess your need for medications and monitor your lab values.

## 2015-12-21 NOTE — Discharge Summary (Signed)
Dorothy White, is a 65 y.o. female  DOB 10-03-1950  MRN [id number].  Admission date:  12/17/2015  Admitting Physician  Edwin Dada, MD  Discharge Date:  12/21/2015   Primary MD  Jonathon Bellows, MD  Recommendations for primary care physician for things to follow:  - Patient to continue with home PT - Patient to follow with cardiology regarding abnormal 2-D echo, [facility] will call patient with a follow-up appointment.   Admission Diagnosis  slurred speach   weakness   Discharge Diagnosis  slurred speach   weakness    Principal Problem:   Ataxia Active Problems:   Right sided weakness      Past Medical History  Diagnosis Date  . Arrhythmia   . Diverticulitis   . Depression   . Anxiety   . HLD (hyperlipidemia)   . Chronic back pain   . Stroke [facility])     "mini" stroke - 06/2013  . Headache(784.0)     Migraines  . GERD (gastroesophageal reflux disease)   . Arthritis   . Cancer ([place])     basal cell skin cancer  . Anemia   . Constipation     Past Surgical History  Procedure Laterality Date  . Tonsillectomy    . Abdominal hysterectomy    . Breast enhancement surgery    . Rhinoplasty    . Oophorectomy Bilateral   . Bunionectomy Bilateral   . Colonoscopy    . Orif humerus fracture Right 02/15/2015    Procedure: OPEN REDUCTION INTERNAL FIXATION (ORIF) PROXIMAL HUMERUS FRACTURE;  Surgeon: Netta Cedars, MD;  Location: [place];  Service: Orthopedics;  Laterality: Right;       History of present illness and  Hospital Course:     Kindly see H&P for history of present illness and admission details, please review complete Labs, Consult reports and Test reports for all details in brief  HPI  from the history and physical done on the day of admission 12/17/2015 HPI: Dorothy White is a 65 y.o. female with medical history significant of depression and migraine presents to the ER  with increasing difficulty walking. Patient states she has been losing her balance when she tries to walk. Later the patient also started developing frontal headache which is not typical of her migraine. Patient also has closely. No facial asymmetry is able to move all extremities 5 x 5. Perla positive. Patient denies any new medications. Denies any nausea vomiting abdominal pain chest pain or shortness of breath. CT of the head is unremarkable. ER physician had discussed with on-call neurologist at [facility] Dr. Wallie Char who has advised patient to be transferred to [facility]. Patient is agreeable to transfer.  ED Course: CT head unremarkable.   Hospital Course  Dorothy White is a 65 y.o. female with history significant of depression and migraine presents to the ER with ataxia, CT head with no intracranial abnormality, subsequent MRI and MRA were unremarkable, and by neurology, been evaluated for complicated migraine.  Complicated  migraine - Neurology consult greatly appreciated, suspicious for complicated migraine ,Negative MRA/MRA head and neck, MRI cspine. She has improved with treatment as migraine in both her headache and gait dysfunction , therefore neurologist think complicated migraine that the most likely diagnosis - MRI C-spine, MRA neck significant for midcervical internal carotid artery stenosis bilaterally most consistent with fibromuscular dysplasia, discussed with neurology, no further workup indicated. - PT appreciated, seen by CIR, not a candidate for CIR admission as does not have rehabilitation diagnosis, and gait continued to improve during hospital stay, currently recommendation is for home with home PT. - We'll discharge on gabapentin 300 mg oral daily at bedtime for 3 days as per neuro recommendation.  Ataxia - Please see above discussion, significantly improved    2-D echo with EF 40-45%, and inferior hypokinesis,  - She denies any chest pain or shortness of  breath, will need cardiology follow-up as an outpatient, no beta blockers or ACE inhibitor giving soft blood pressure, discussed cardiology coordinator, they will call her with outpatient follow-up appointment.  History of depression - Continue with home medication  Discharge Condition:  Stable   Follow UP  Follow-up Information    Follow up with WEBB, CAROL D, MD. Schedule an appointment as soon as possible for a visit in 1 week.   Specialty:  Family Medicine   Contact information:   [place] [id number] [phone]         Discharge Instructions  and  Discharge Medications     Discharge Instructions    Discharge instructions    Complete by:  As directed   Follow with Primary MD WEBB, CAROL D, MD in 7 days   Get CBC, CMP, 2 view Chest X ray checked  by Primary MD next visit.    Activity: As tolerated with Full fall precautions use walker/cane & assistance as needed   Disposition Home    Diet: Heart Healthy  .  For Heart failure patients - Check your Weight same time everyday, if you gain over 2 pounds, or you develop in leg swelling, experience more shortness of breath or chest pain, call your Primary MD immediately. Follow Cardiac Low Salt Diet and 1.5 lit/day fluid restriction.   On your next visit with your primary care physician please Get Medicines reviewed and adjusted.   Please request your Prim.MD to go over all Hospital Tests and Procedure/Radiological results at the follow up, please get all Hospital records sent to your Prim MD by signing hospital release before you go home.   If you experience worsening of your admission symptoms, develop shortness of breath, life threatening emergency, suicidal or homicidal thoughts you must seek medical attention immediately by calling 911 or calling your MD immediately  if symptoms less severe.  You Must read complete instructions/literature along with all the possible adverse  reactions/side effects for all the Medicines you take and that have been prescribed to you. Take any new Medicines after you have completely understood and accpet all the possible adverse reactions/side effects.   Do not drive, operating heavy machinery, perform activities at heights, swimming or participation in water activities or provide baby sitting services if your were admitted for syncope or siezures until you have seen by Primary MD or a Neurologist and advised to do so again.  Do not drive when taking Pain medications.    Do not take more than prescribed Pain, Sleep and Anxiety Medications  Special Instructions: If you have smoked or  chewed Tobacco  in the last 2 yrs please stop smoking, stop any regular Alcohol  and or any Recreational drug use.  Wear Seat belts while driving.   Please note  You were cared for by a hospitalist during your hospital stay. If you have any questions about your discharge medications or the care you received while you were in the hospital after you are discharged, you can call the unit and asked to speak with the hospitalist on call if the hospitalist that took care of you is not available. Once you are discharged, your primary care physician will handle any further medical issues. Please note that NO REFILLS for any discharge medications will be authorized once you are discharged, as it is imperative that you return to your primary care physician (or establish a relationship with a primary care physician if you do not have one) for your aftercare needs so that they can reassess your need for medications and monitor your lab values.            Medication List    STOP taking these medications        cephALEXin 500 MG capsule  Commonly known as:  KEFLEX     oxyCODONE-acetaminophen 10-325 MG tablet  Commonly known as:  PERCOCET      TAKE these medications        bismuth subsalicylate 99991111 MG chewable tablet  Commonly known as:  PEPTO BISMOL  Chew  524 mg by mouth as needed for indigestion.     buPROPion 300 MG 24 hr tablet  Commonly known as:  WELLBUTRIN XL  Take 300 mg by mouth daily.     calcium-vitamin D 500-200 MG-UNIT tablet  Commonly known as:  OSCAL WITH D  Take 1 tablet by mouth daily with breakfast.     clonazePAM 1 MG tablet  Commonly known as:  KLONOPIN  Take 1 mg by mouth at bedtime as needed (anxiety/sleep.).     FLUoxetine 20 MG capsule  Commonly known as:  PROZAC  Take 60 mg by mouth daily.     gabapentin 600 MG tablet  Commonly known as:  NEURONTIN  Take 0.5 tablets (300 mg total) by mouth at bedtime. Please take for 3 days then stop     HYDROcodone-acetaminophen 5-325 MG tablet  Commonly known as:  NORCO/VICODIN  Take 1-2 tablets by mouth every 6 hours as needed for pain and/or cough.     mometasone 50 MCG/ACT nasal spray  Commonly known as:  NASONEX  Place 2 sprays into the nose daily.     montelukast 10 MG tablet  Commonly known as:  SINGULAIR  Take 10 mg by mouth daily.     RELPAX 40 MG tablet  Generic drug:  eletriptan  Take 40 mg by mouth every 2 (two) hours as needed for migraine.     traZODone 100 MG tablet  Commonly known as:  DESYREL  Take 200 mg by mouth at bedtime.     XYZAL ALLERGY 24HR 5 MG tablet  Generic drug:  levocetirizine  Take 5 mg by mouth every evening.          Diet and Activity recommendation: See Discharge Instructions above   Consults obtained -  Neurology   Major procedures and Radiology Reports - PLEASE review detailed and final reports for all details, in brief -      Dg Chest 2 View  12/18/2015  CLINICAL DATA:  Difficulty walking, dizziness, weakness, smoking history EXAM: CHEST  2  VIEW COMPARISON:  Chest x-ray of 09/25/2015 FINDINGS: The lungs remain clear and somewhat hyperaerated. A small right pleural effusion cannot be excluded. Mediastinal and hilar contours are unremarkable. The heart is within normal limits in size. The bones are diffusely  osteopenic. IMPRESSION: No change in hyper aeration consistent with emphysema. Cannot exclude a tiny right pleural effusion. Electronically Signed   By: Ivar Drape M.D.   On: 12/18/2015 08:13   Ct Head Wo Contrast  12/17/2015  CLINICAL DATA:  Right-sided weakness.  Headache and dizziness. EXAM: CT HEAD WITHOUT CONTRAST TECHNIQUE: Contiguous axial images were obtained from the base of the skull through the vertex without intravenous contrast. COMPARISON:  Head CT 07/09/2015 FINDINGS: No intracranial hemorrhage, mass effect, or midline shift. No hydrocephalus. The basilar cisterns are patent. No evidence of territorial infarct. Stable degree of chronic small vessel ischemia. No intracranial fluid collection. Calvarium is intact. Included paranasal sinuses and mastoid air cells are well aerated. IMPRESSION: No acute intracranial abnormality. Stable mild chronic small vessel ischemia. Electronically Signed   By: Jeb Levering M.D.   On: 12/17/2015 23:06   Mr Angiogram Neck W Wo Contrast  12/19/2015  CLINICAL DATA:  Unsteady gait.  Falling to the right. EXAM: MRA NECK WITHOUT AND WITH CONTRAST TECHNIQUE: Multiplanar and multiecho pulse sequences of the neck were obtained without and with intravenous contrast. Angiographic images of the neck were obtained using MRA technique without and with intravenous contrast. CONTRAST:  74mL MULTIHANCE GADOBENATE DIMEGLUMINE 529 MG/ML IV SOLN COMPARISON:  MRI brain and MRA head 12/18/2014 FINDINGS: Time-of-flight images demonstrate no significant flow disturbance at either carotid bifurcation. There is antegrade flow within the vertebral arteries bilaterally. A 3 vessel arch configuration is present without significant proximal stenosis of the great vessels. The right common carotid artery is within normal limits. The bifurcation is unremarkable. There is focal irregularity in the midcervical segment without significant stenosis. The more distal vessel is within normal  limits. Mild cavernous internal carotid artery atherosclerotic disease is present without significant stenosis. The left common carotid artery is within normal limits. Minimal irregularity is present at the left carotid bifurcation. There is no significant stenosis. Focal irregularity is present in the mid cervical left ICA without significant stenosis. Minimal atherosclerotic changes are present within the cavernous left internal carotid artery. Both vertebral arteries originate from the subclavian arteries. The vertebral arteries are codominant. The PICA origins are visualized and normal. The basilar artery is within normal limits. IMPRESSION: 1. Focal irregularity without significant stenosis in the mid cervical internal carotid artery bilaterally. This is most consistent with fibromuscular dysplasia. 2. Minimal atherosclerotic changes in the cavernous internal carotid arteries bilaterally without significant stenosis. Electronically Signed   By: San Morelle M.D.   On: 12/19/2015 21:20   Mr Brain Wo Contrast  12/20/2015  CLINICAL DATA:  Ataxia and frontal headache.  Prior TIA. EXAM: MRI HEAD WITHOUT CONTRAST TECHNIQUE: Multiplanar, multiecho pulse sequences of the brain and surrounding structures were obtained without intravenous contrast. COMPARISON:  12/18/2015 FINDINGS: There is no evidence of acute infarct, mass, midline shift, or extra-axial fluid collection. A focus of susceptibility artifact is again seen in the left putamen and may reflect a chronic microhemorrhage. Small foci of T2 hyperintensity scattered throughout the cerebral white matter bilaterally are nonspecific and unchanged. Ventricles and sulci are within normal limits for age. Orbits are unremarkable. Paranasal sinuses and mastoid air cells are clear. Major intracranial vascular flow voids are preserved. No skull lesion identified. IMPRESSION: 1. No acute intracranial  abnormality. 2. Unchanged, mild cerebral white matter disease,  nonspecific. Electronically Signed   By: Logan Bores M.D.   On: 12/20/2015 20:40   Mr Brain Wo Contrast  12/18/2015  CLINICAL DATA:  Initial evaluation for acute headache with ataxia. EXAM: MRI HEAD WITHOUT CONTRAST MRA HEAD WITHOUT CONTRAST TECHNIQUE: Multiplanar, multiecho pulse sequences of the brain and surrounding structures were obtained without intravenous contrast. Angiographic images of the head were obtained using MRA technique without contrast. COMPARISON:  Prior CT from 12/17/2015. FINDINGS: MRI HEAD FINDINGS Age appropriate cerebral atrophy present. Few scattered T2/FLAIR hyperintense foci noted within the periventricular, deep, and subcortical white matter of both cerebral hemispheres, nonspecific, but most likely related to mild chronic small vessel ischemic disease. These foci are stable relative to previous exams. No abnormal foci of restricted diffusion to suggest acute intracranial infarct. Gray-white matter differentiation maintained. Major intracranial vascular flow voids are preserved. No areas of chronic infarction. No acute or chronic intracranial hemorrhage. No mass lesion, midline shift, or mass effect. No hydrocephalus. No extra-axial fluid collection. Major dural sinuses are grossly patent. Craniocervical junction normal. Visualized upper cervical spine unremarkable. Pituitary gland within normal limits. No acute abnormality about the orbits. Mild scattered mucosal thickening within the paranasal sinuses. No air-fluid levels to suggest active sinus infection. No mastoid effusion. Inner ear structures grossly normal. Bone marrow signal intensity within normal limits. No scalp soft tissue abnormality. MRA HEAD FINDINGS ANTERIOR CIRCULATION: Visualized distal cervical segments of the internal carotid arteries are widely patent with antegrade flow. Petrous, cavernous, and supraclinoid segments widely patent bilaterally without flow-limiting stenosis. A1 segments patent. Anterior  communicating artery normal. Anterior cerebral arteries well opacified. M1 segments patent without stenosis or occlusion. MCA bifurcations normal. MCA branches well opacified and fairly symmetric. POSTERIOR CIRCULATION: Vertebral arteries largely code dominant and patent to the vertebrobasilar junction. Posterior inferior cerebral arteries patent bilaterally. Basilar artery widely patent to its distal aspect. Basilar tip is ectatic without frank aneurysm. Superior cerebral arteries patent bilaterally. Both of the posterior cerebral arteries arise from the basilar arteries and are well opacified to their distal aspects. Small right posterior communicating artery noted. No aneurysm or vascular malformation. IMPRESSION: MRI HEAD IMPRESSION: 1. No acute intracranial infarct or other process identified. 2. Stable mild nonspecific white matter disease. MRA HEAD IMPRESSION: Negative intracranial MRA. No large or proximal arterial branch occlusion. No high-grade or correctable stenosis. Electronically Signed   By: Jeannine Boga M.D.   On: 12/18/2015 06:59   Mr Cervical Spine Wo Contrast  12/19/2015  CLINICAL DATA:  Unsteady gait and falling to the right. EXAM: MRI CERVICAL SPINE WITHOUT CONTRAST TECHNIQUE: Multiplanar, multisequence MR imaging of the cervical spine was performed. No intravenous contrast was administered. COMPARISON:  MRI brain and MRA head 12/18/2015 FINDINGS: Alignment: Slight retrolisthesis is present at C6-7. Alignment is otherwise anatomic. Vertebrae: A hemangioma is present anteriorly at CT. There is mild fatty infiltration of the marrow space. Mild endplate marrow changes are present at C6-7. Vertebral body heights are maintained. Cord: Normal signal is present in the cervical and upper thoracic spinal cord to the lowest imaged level, T2-3. Posterior Fossa, vertebral arteries, paraspinal tissues: The craniocervical junction is within normal limits. The visualized intracranial contents are  normal. Flow is present in the vertebral arteries bilaterally. Disc levels: C2-3: Mild facet hypertrophy is worse on the right. There is no significant stenosis. C3-4: A mild rightward disc osteophyte complex is present. Asymmetric right-sided facet hypertrophy is noted as well. There is no significant stenosis.  C4-5: Asymmetric right-sided facet hypertrophy is present. There is no significant stenosis. C5-6: A leftward disc osteophyte complex partially effaces the ventral CSF. Mild left foraminal narrowing is evident. C6-7: A broad-based disc osteophyte complex effaces the ventral CSF. Moderate left and mild right foraminal stenosis is present. C7-T1:  Negative. IMPRESSION: 1. The most significant level is C6-7 with moderate left central and left foraminal stenosis. 2. Mild right foraminal narrowing at C6-7. 3. Leftward disc osteophyte complex with mild left central and left foraminal stenosis at C5-6. 4. Rightward disc osteophyte complex and asymmetric right-sided facet hypertrophy at C3-4 without significant stenosis. Electronically Signed   By: San Morelle M.D.   On: 12/19/2015 21:16   Mr Jodene Nam Head/brain Wo Cm  12/18/2015  CLINICAL DATA:  Initial evaluation for acute headache with ataxia. EXAM: MRI HEAD WITHOUT CONTRAST MRA HEAD WITHOUT CONTRAST TECHNIQUE: Multiplanar, multiecho pulse sequences of the brain and surrounding structures were obtained without intravenous contrast. Angiographic images of the head were obtained using MRA technique without contrast. COMPARISON:  Prior CT from 12/17/2015. FINDINGS: MRI HEAD FINDINGS Age appropriate cerebral atrophy present. Few scattered T2/FLAIR hyperintense foci noted within the periventricular, deep, and subcortical white matter of both cerebral hemispheres, nonspecific, but most likely related to mild chronic small vessel ischemic disease. These foci are stable relative to previous exams. No abnormal foci of restricted diffusion to suggest acute  intracranial infarct. Gray-white matter differentiation maintained. Major intracranial vascular flow voids are preserved. No areas of chronic infarction. No acute or chronic intracranial hemorrhage. No mass lesion, midline shift, or mass effect. No hydrocephalus. No extra-axial fluid collection. Major dural sinuses are grossly patent. Craniocervical junction normal. Visualized upper cervical spine unremarkable. Pituitary gland within normal limits. No acute abnormality about the orbits. Mild scattered mucosal thickening within the paranasal sinuses. No air-fluid levels to suggest active sinus infection. No mastoid effusion. Inner ear structures grossly normal. Bone marrow signal intensity within normal limits. No scalp soft tissue abnormality. MRA HEAD FINDINGS ANTERIOR CIRCULATION: Visualized distal cervical segments of the internal carotid arteries are widely patent with antegrade flow. Petrous, cavernous, and supraclinoid segments widely patent bilaterally without flow-limiting stenosis. A1 segments patent. Anterior communicating artery normal. Anterior cerebral arteries well opacified. M1 segments patent without stenosis or occlusion. MCA bifurcations normal. MCA branches well opacified and fairly symmetric. POSTERIOR CIRCULATION: Vertebral arteries largely code dominant and patent to the vertebrobasilar junction. Posterior inferior cerebral arteries patent bilaterally. Basilar artery widely patent to its distal aspect. Basilar tip is ectatic without frank aneurysm. Superior cerebral arteries patent bilaterally. Both of the posterior cerebral arteries arise from the basilar arteries and are well opacified to their distal aspects. Small right posterior communicating artery noted. No aneurysm or vascular malformation. IMPRESSION: MRI HEAD IMPRESSION: 1. No acute intracranial infarct or other process identified. 2. Stable mild nonspecific white matter disease. MRA HEAD IMPRESSION: Negative intracranial MRA. No  large or proximal arterial branch occlusion. No high-grade or correctable stenosis. Electronically Signed   By: Jeannine Boga M.D.   On: 12/18/2015 06:59    Micro Results   No results found for this or any previous visit (from the past 240 hour(s)).     Today   Subjective:   Dorothy White today has no chest or abdominal pain,no new weakness tingling or numbness, feels much better wants to go home today. Reports her gait significantly improved, headache still present, but significantly improved as well.  Objective:   Blood pressure 131/53, pulse 57, temperature 98.3 F (36.8 C), temperature  source Oral, resp. rate 18, height 5\' 5"  (1.651 m), weight 58.968 kg (130 lb), SpO2 98 %.  No intake or output data in the 24 hours ending 12/21/15 1113  Exam Awake Alert, Oriented x 3,  Normal affect [place].AT,PERRAL Supple Neck,No JVD, No cervical lymphadenopathy appriciated.  Symmetrical Chest wall movement, Good air movement bilaterally, CTAB RRR,No Gallops,Rubs or new Murmurs, No Parasternal Heave +ve B.Sounds, Abd Soft, Non tender, No organomegaly appriciated, No rebound -guarding or rigidity. No Cyanosis, Clubbing or edema, No new Rash or bruise  Data Review   CBC w Diff:  Lab Results  Component Value Date   WBC 4.5 12/19/2015   HGB 11.1* 12/19/2015   HCT 36.5 12/19/2015   PLT 194 12/19/2015   LYMPHOPCT 19 12/17/2015   MONOPCT 11 12/17/2015   EOSPCT 8 12/17/2015   BASOPCT 1 12/17/2015    CMP:  Lab Results  Component Value Date   NA 141 12/19/2015   K 4.5 12/19/2015   CL 109 12/19/2015   CO2 28 12/19/2015   BUN 11 12/19/2015   CREATININE 0.66 12/19/2015   PROT 5.8* 12/18/2015   ALBUMIN 3.1* 12/18/2015   BILITOT 0.7 12/18/2015   ALKPHOS 78 12/18/2015   AST 14* 12/18/2015   ALT 16 12/18/2015  .   Total Time in preparing paper work, data evaluation and todays exam - 35 minutes  Famous Eisenhardt M.D on 12/21/2015 at [facility]   [phone]

## 2015-12-21 NOTE — Clinical Social Work Note (Signed)
CSW received referral for SNF.  Case discussed with case manager, and plan is to discharge home with home health.  CSW to sign off please re-consult if social work needs arise.  Rebbie Lauricella R. Zev Blue, MSW, LCSWA [phone]  

## 2015-12-21 NOTE — Care Management Note (Signed)
Case Management Note  Patient Details  Name: Dorothy White MRN: [id number] Date of Birth: Jun 06, 1951  Subjective/Objective:                    Action/Plan: Patient discharging home with [facility] services. CM met with the patient and provided her a list of [place] agencies in the [place] area. She selected [place]. Tiffany with [facility] notified and accepted the referral. Pt also ordered a 3 in [place] with [facility] notified and will deliver the equipment to the room. Will update the bedside RN.   Expected Discharge Date:                  Expected Discharge Plan:  [place]  In-House Referral:     Discharge planning Services  CM Consult  Post Acute Care Choice:  Home Health, Durable Medical Equipment Choice offered to:  Patient  DME Arranged:  3-N-1 DME Agency:  [place]:  PT [place]:  [place]  Status of Service:  Completed, signed off  Medicare Important Message Given:    Date Medicare IM Given:    Medicare IM give by:    Date Additional Medicare IM Given:    Additional Medicare Important Message give by:     If discussed at [place] of Stay Meetings, dates discussed:    Additional Comments:  Pollie Friar, RN 12/21/2015, 11:26 AM

## 2015-12-21 NOTE — Progress Notes (Signed)
Subjective: Both headache and gait have returned almost to normal.   Exam: Filed Vitals:   12/21/15 0056 12/21/15 0623  BP: 107/45 115/50  Pulse: 60 59  Temp: 97.7 F (36.5 C) 97.7 F (36.5 C)  Resp: 18 18   Gen: In bed, NAD Resp: non-labored breathing, no acute distress Abd: soft, nt  Neuro: MS: awake, alert, interactive and appropriate CN: EOMI, face symmetric Motor: MAEW Cerebellar: Markedly improved left arm tremor Gait: stable     Impression: 65 yo F with persistent dizziness associated with photophobic headache. Negative MRA/MRA head and neck, MRI cspine. 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   I have found daily gabapentin can be helpful for some people with persistent low grade headache.   Recommendations: 1) gabapentin 300mg  QHS x 3 days.  2) No further testing at this time. Neurology will sign off, please call with any further questions or concerns.   Roland Rack, MD [facility] [phone]  If 7pm- 7am, please page neurology on call as listed in [place].

## 2015-12-26 ENCOUNTER — Ambulatory Visit (INDEPENDENT_AMBULATORY_CARE_PROVIDER_SITE_OTHER): Payer: BLUE CROSS/BLUE SHIELD | Admitting: Cardiology

## 2015-12-26 ENCOUNTER — Other Ambulatory Visit: Payer: Self-pay | Admitting: Cardiology

## 2015-12-26 ENCOUNTER — Encounter: Payer: Self-pay | Admitting: Cardiology

## 2015-12-26 ENCOUNTER — Ambulatory Visit (INDEPENDENT_AMBULATORY_CARE_PROVIDER_SITE_OTHER): Payer: BLUE CROSS/BLUE SHIELD

## 2015-12-26 VITALS — BP 120/70 | HR 70 | Ht 65.0 in | Wt 128.1 lb

## 2015-12-26 DIAGNOSIS — I519 Heart disease, unspecified: Secondary | ICD-10-CM | POA: Diagnosis not present

## 2015-12-26 DIAGNOSIS — I4891 Unspecified atrial fibrillation: Secondary | ICD-10-CM | POA: Diagnosis not present

## 2015-12-26 DIAGNOSIS — G459 Transient cerebral ischemic attack, unspecified: Secondary | ICD-10-CM

## 2015-12-26 DIAGNOSIS — R0609 Other forms of dyspnea: Secondary | ICD-10-CM

## 2015-12-26 DIAGNOSIS — R0600 Dyspnea, unspecified: Secondary | ICD-10-CM | POA: Insufficient documentation

## 2015-12-26 NOTE — Patient Instructions (Signed)
Medication Instructions:   Your physician recommends that you continue on your current medications as directed. Please refer to the Current Medication list given to you today.     Testing/Procedures:  Your physician has requested that you have en exercise stress myoview. For further information please visit [email or web address]. Please follow instruction sheet, as given.    Your physician has recommended that you wear an event monitor. Event monitors are medical devices that record the heart's electrical activity. Doctors most often [place] these monitors to diagnose arrhythmias. Arrhythmias are problems with the speed or rhythm of the heartbeat. The monitor is a small, portable device. You can wear one while you do your normal daily activities. This is usually used to diagnose what is causing palpitations/syncope (passing out).    Follow-Up:  2 MONTHS WITH DR [name]       If you need a refill on your cardiac medications before your next appointment, please call your pharmacy.

## 2015-12-26 NOTE — Progress Notes (Signed)
Cardiology Office Note    Date:  12/26/2015   ID:  Dorothy White, DOB 1951-07-16, MRN [id number]  PCP:  Jonathon Bellows, MD  Cardiologist:   Ena Dawley, MD  Referring physician: Jonathon Bellows, MD   Chief complain: Referral for LV dysfunction  History of Present Illness:  Dorothy White is a 65 y.o. female  with medical history significant of depression and migraine Presented to the emergency room on 12/21/2015 with falling on her side while walking slurred speech and blurry vision. This was during an episode of migraine headache. No neurologic deficit on presentation normal brain MRI/MRA, normal MRI of the spine. The symptoms have resolved with treatment of migraine and her gait dysfunction has resolved. It was concluded by neurology that this was a presentation of complicated migraine - ataxia. The patient states that she has noticed recently that she is more short of breath on exertion especially when walking stairs or uphill. She doesn't exercise but is retired and is able to do independently goal activities of daily living. She has occasional palpitations that are not related to exertion and they will last approximately a minute. She has no associated dizziness or syncope with palpitations. Her father died in his 91s possibly to myocardial infarction. Her mom died of lung cancer in her brother is currently dealing with lung cancer as well. She has never smoked. Long time ago she was told that she has mitral valve prolapse and arrhythmia but doesn't remember what type. While in the hospital she was found to have LVEF of 40-45% with inferior hypokinesis and was referred to [place] for further evaluation.  Past Medical History  Diagnosis Date  . Arrhythmia   . Diverticulitis   . Depression   . Anxiety   . HLD (hyperlipidemia)   . Chronic back pain   . Stroke [facility])     "mini" stroke - 06/2013  . Headache(784.0)     Migraines  . GERD (gastroesophageal reflux disease)   .  Arthritis   . Cancer ([place])     basal cell skin cancer  . Anemia   . Constipation     Past Surgical History  Procedure Laterality Date  . Tonsillectomy    . Abdominal hysterectomy    . Breast enhancement surgery    . Rhinoplasty    . Oophorectomy Bilateral   . Bunionectomy Bilateral   . Colonoscopy    . Orif humerus fracture Right 02/15/2015    Procedure: OPEN REDUCTION INTERNAL FIXATION (ORIF) PROXIMAL HUMERUS FRACTURE;  Surgeon: Netta Cedars, MD;  Location: [place];  Service: Orthopedics;  Laterality: Right;    Current Medications: Outpatient Prescriptions Prior to Visit  Medication Sig Dispense Refill  . bismuth subsalicylate (PEPTO BISMOL) 262 MG chewable tablet Chew 524 mg by mouth as needed for indigestion.    Marland Kitchen buPROPion (WELLBUTRIN XL) 300 MG 24 hr tablet Take 300 mg by mouth daily.   0  . calcium-vitamin D (OSCAL WITH D) 500-200 MG-UNIT per tablet Take 1 tablet by mouth daily with breakfast.    . clonazePAM (KLONOPIN) 1 MG tablet Take 1 mg by mouth at bedtime as needed (anxiety/sleep.).   3  . FLUoxetine (PROZAC) 20 MG capsule Take 60 mg by mouth daily.     Marland Kitchen levocetirizine (XYZAL ALLERGY 24HR) 5 MG tablet Take 5 mg by mouth every evening.    . mometasone (NASONEX) 50 MCG/ACT nasal spray Place 2 sprays into the nose daily.  11  . traZODone (DESYREL)  100 MG tablet Take 200 mg by mouth at bedtime.     . gabapentin (NEURONTIN) 600 MG tablet Take 0.5 tablets (300 mg total) by mouth at bedtime. Please take for 3 days then stop 3 tablet 0  . HYDROcodone-acetaminophen (NORCO/VICODIN) 5-325 MG tablet Take 1-2 tablets by mouth every 6 hours as needed for pain and/or cough. 7 tablet 0  . montelukast (SINGULAIR) 10 MG tablet Take 10 mg by mouth daily.     . RELPAX 40 MG tablet Take 40 mg by mouth every 2 (two) hours as needed for migraine.   11   No facility-administered medications prior to visit.     Allergies:   Penicillins   Social History   Social History  . Marital Status:  Divorced    Spouse Name: N/A  . Number of Children: 0  . Years of Education: 12   Occupational History  . retired     Retired  .     Social History Main Topics  . Smoking status: Current Some Day Smoker    Types: Cigarettes    Last Attempt to Quit: 06/30/2011  . Smokeless tobacco: Never Used  . Alcohol Use: No     Comment: denies   . Drug Use: No  . Sexual Activity: No   Other Topics Concern  . None   Social History Narrative   Patient lives at home alone.    Patient is retired.   Education- High school.   Right handed.   Patient drinks one cup of coffee and a big glass of ice tea.     Family History:  The patient's family history includes Diabetes in her paternal grandmother; Esophageal cancer in her brother; Heart attack in her father; Hypertension in her brother; Lung cancer in her mother. There is no history of Colon cancer, Kidney disease, or Liver disease.   ROS:   Please see the history of present illness.    ROS All other systems reviewed and are negative.   PHYSICAL EXAM:   VS:  BP 120/70 mmHg  Pulse 70  Ht 5\' 5"  (1.651 m)  Wt 128 lb 1.9 oz (58.115 kg)  BMI 21.32 kg/m2   GEN: Well nourished, well developed, in no acute distress HEENT: normal Neck: no JVD, carotid bruits, or masses Cardiac: RRR; no murmurs, rubs, or gallops,no edema  Respiratory:  clear to auscultation bilaterally, normal work of breathing GI: soft, nontender, nondistended, + BS MS: no deformity or atrophy Skin: warm and dry, no rash Neuro:  Alert and Oriented x 3, Strength and sensation are intact Psych: euthymic mood, full affect  Wt Readings from Last 3 Encounters:  12/26/15 128 lb 1.9 oz (58.115 kg)  12/18/15 130 lb (58.968 kg)  02/15/15 132 lb (59.875 kg)      Studies/Labs Reviewed:   EKG:   From 12/17/2015 shows normal sinus rhythm nonspecific ST-T wave abnormalities but otherwise normal EKG.  Recent Labs: 12/18/2015: ALT 16 12/19/2015: BUN 11; Creatinine, Ser 0.66;  Hemoglobin 11.1*; Platelets 194; Potassium 4.5; Sodium 141   Lipid Panel    Component Value Date/Time   CHOL 156 12/18/2015 0510   CHOL 243* 08/15/2013 0839   TRIG 100 12/18/2015 0510   HDL 47 12/18/2015 0510   HDL 55 08/15/2013 0839   CHOLHDL 3.3 12/18/2015 0510   VLDL 20 12/18/2015 0510   LDLCALC 89 12/18/2015 0510   LDLCALC 142* 08/15/2013 0839   TTE: 12/18/2015 - Left ventricle: The cavity size was normal. Systolic function was  mildly to moderately reduced. The estimated ejection fraction was  in the range of 40% to 45%. Moderate hypokinesis of the  basalinferior myocardium. - Pulmonary arteries: Systolic pressure was mildly to moderately  increased. PA peak pressure: 40 mm Hg (S).  Carotid US: - The vertebral arteries appear patent with antegrade flow. - Findings consistent with 1- 39 percent stenosis involving the  right internal carotid artery and the left internal carotid  artery.    ASSESSMENT:    1. Transient cerebral ischemia, unspecified transient cerebral ischemia type   2. DOE (dyspnea on exertion)   3. LV dysfunction      PLAN:  In order of problems listed above:  Regarding LV dysfunction with regional wall motion abnormalities there is concern for underlying coronary artery disease especially in the context of family history of premature coronary artery disease. Patient also complains of worsening dyspnea on exertion, therefore we will schedule an exercise treadmill nuclear stress test.  Regarding transient ischemic attack, this was concluded as an episode of ataxia during complicated migraine. The patient had similar episode 2 years ago. We have to exclude the possibility that she has atrial fibrillation that can be causing this. We will start a 30 day event monitor to further evaluate. Her left atrium is of normal size and she has only trivial mitral regurgitation with makes this of lower probability.  Medication Adjustments/Labs and Tests  Ordered: Current medicines are reviewed at length with the patient today.  Concerns regarding medicines are outlined above.  Medication changes, Labs and Tests ordered today are listed in the Patient Instructions below. Patient Instructions  Medication Instructions:   Your physician recommends that you continue on your current medications as directed. Please refer to the Current Medication list given to you today.     Testing/Procedures:  Your physician has requested that you have en exercise stress myoview. For further information please visit [email or web address]. Please follow instruction sheet, as given.    Your physician has recommended that you wear an event monitor. Event monitors are medical devices that record the heart's electrical activity. Doctors most often [place] these monitors to diagnose arrhythmias. Arrhythmias are problems with the speed or rhythm of the heartbeat. The monitor is a small, portable device. You can wear one while you do your normal daily activities. This is usually used to diagnose what is causing palpitations/syncope (passing out).    Follow-Up:  2 MONTHS WITH DR [name]       If you need a refill on your cardiac medications before your next appointment, please call your pharmacy.       Signed, Ena Dawley, MD  12/26/2015 1:12 PM    [facility] [address] [place], [place], [place]  [address] Phone: [phone]; Fax: [phone]

## 2015-12-31 ENCOUNTER — Telehealth (HOSPITAL_COMMUNITY): Payer: Self-pay | Admitting: *Deleted

## 2015-12-31 NOTE — Telephone Encounter (Signed)
Patient given detailed instructions per Myocardial Perfusion Study Information Sheet for the test on 01/02/16 at 0745. Patient notified to arrive 15 minutes early and that it is imperative to arrive on time for appointment to keep from having the test rescheduled.  If you need to cancel or reschedule your appointment, please call the office within 24 hours of your appointment. Failure to do so may result in a cancellation of your appointment, and a $50 no show fee. Patient verbalized understanding.Burrel Legrand, Ranae [place]

## 2016-01-02 ENCOUNTER — Encounter (HOSPITAL_COMMUNITY): Payer: BLUE CROSS/BLUE SHIELD

## 2016-01-03 ENCOUNTER — Ambulatory Visit (HOSPITAL_COMMUNITY): Payer: BLUE CROSS/BLUE SHIELD | Attending: Cardiovascular Disease

## 2016-01-03 DIAGNOSIS — R9439 Abnormal result of other cardiovascular function study: Secondary | ICD-10-CM | POA: Insufficient documentation

## 2016-01-03 DIAGNOSIS — G459 Transient cerebral ischemic attack, unspecified: Secondary | ICD-10-CM

## 2016-01-03 DIAGNOSIS — I519 Heart disease, unspecified: Secondary | ICD-10-CM | POA: Diagnosis not present

## 2016-01-03 DIAGNOSIS — R002 Palpitations: Secondary | ICD-10-CM | POA: Diagnosis not present

## 2016-01-03 DIAGNOSIS — R0609 Other forms of dyspnea: Secondary | ICD-10-CM | POA: Diagnosis not present

## 2016-01-03 LAB — MYOCARDIAL PERFUSION IMAGING
LV dias vol: 67 mL (ref 46–106)
LV sys vol: 22 mL
Peak HR: 113 {beats}/min
RATE: 0.33
Rest HR: 75 {beats}/min
SDS: 7
SRS: 13
SSS: 20
TID: 0.96

## 2016-01-03 MED ORDER — TECHNETIUM TC 99M TETROFOSMIN IV KIT
30.0000 | PACK | Freq: Once | INTRAVENOUS | Status: AC | PRN
Start: 2016-01-03 — End: 2016-01-03
  Administered 2016-01-03: 30 via INTRAVENOUS
  Filled 2016-01-03: qty 30

## 2016-01-03 MED ORDER — TECHNETIUM TC 99M TETROFOSMIN IV KIT
10.8000 | PACK | Freq: Once | INTRAVENOUS | Status: AC | PRN
  Administered 2016-01-03: 11 via INTRAVENOUS
  Filled 2016-01-03: qty 11

## 2016-01-03 MED ORDER — REGADENOSON 0.4 MG/5ML IV SOLN
0.4000 mg | Freq: Once | INTRAVENOUS | Status: AC
  Administered 2016-01-03: 0.4 mg via INTRAVENOUS

## 2016-01-07 ENCOUNTER — Telehealth: Payer: Self-pay | Admitting: Cardiology

## 2016-01-07 DIAGNOSIS — E785 Hyperlipidemia, unspecified: Secondary | ICD-10-CM

## 2016-01-07 DIAGNOSIS — I519 Heart disease, unspecified: Secondary | ICD-10-CM

## 2016-01-07 MED ORDER — LISINOPRIL 5 MG PO TABS: 5.0000 mg | ORAL_TABLET | Freq: Every day | ORAL | Status: DC

## 2016-01-07 MED ORDER — ROSUVASTATIN CALCIUM 5 MG PO TABS: 5.0000 mg | ORAL_TABLET | Freq: Every day | ORAL | Status: DC

## 2016-01-07 NOTE — Telephone Encounter (Signed)
NEW MESSAGE   Pt is calling for rn she verbalized that she is returning her call for the results of stress test

## 2016-01-07 NOTE — Telephone Encounter (Signed)
-----   Message from Dorothy Spark, MD sent at 01/04/2016  5:27 PM EDT ----- She has an evidence of small prior infarct with mild LV dysfunction but no ischemia. I would start her on lisinopril 5 mg po daily and a low dose statin - rosuvastatin 5 mg po daily  Please recheck CMP in 1 month.

## 2016-01-07 NOTE — Telephone Encounter (Signed)
Notified the pt that per Dr [name], her stress test showed an evidence of small prior infarct with mild LV dysfunction, but no ischemia. Informed the pt that per Dr [name], she recommends that the pt start taking lisinopril 5 mg po daily and a low dose statin, rosuvastatin 5 mg po daily.  Informed the pt that with addition of these meds, Dr [name] recommends that she come into the office lab work to check a cmet.  Scheduled the pts lab appt for 02/06/16 at our office to check a cmet.  Confirmed the pharmacy of choice with the pt.  Pt verbalized understanding and agrees with this plan.

## 2016-01-23 ENCOUNTER — Encounter (HOSPITAL_COMMUNITY): Payer: Self-pay | Admitting: Emergency Medicine

## 2016-01-23 ENCOUNTER — Emergency Department (HOSPITAL_COMMUNITY)
Admission: EM | Admit: 2016-01-23 | Discharge: 2016-01-24 | Disposition: A | Discharge status: Home / Self Care | Payer: BLUE CROSS/BLUE SHIELD | Attending: Emergency Medicine | Admitting: Emergency Medicine

## 2016-01-23 ENCOUNTER — Telehealth: Payer: Self-pay | Admitting: Nurse Practitioner

## 2016-01-23 ENCOUNTER — Emergency Department (HOSPITAL_COMMUNITY): Payer: BLUE CROSS/BLUE SHIELD

## 2016-01-23 DIAGNOSIS — Z85828 Personal history of other malignant neoplasm of skin: Secondary | ICD-10-CM | POA: Diagnosis not present

## 2016-01-23 DIAGNOSIS — R079 Chest pain, unspecified: Secondary | ICD-10-CM

## 2016-01-23 DIAGNOSIS — Z8673 Personal history of transient ischemic attack (TIA), and cerebral infarction without residual deficits: Secondary | ICD-10-CM | POA: Insufficient documentation

## 2016-01-23 DIAGNOSIS — F17210 Nicotine dependence, cigarettes, uncomplicated: Secondary | ICD-10-CM | POA: Diagnosis not present

## 2016-01-23 DIAGNOSIS — R0789 Other chest pain: Secondary | ICD-10-CM | POA: Insufficient documentation

## 2016-01-23 DIAGNOSIS — Z7982 Long term (current) use of aspirin: Secondary | ICD-10-CM | POA: Diagnosis not present

## 2016-01-23 LAB — URINALYSIS, ROUTINE W REFLEX MICROSCOPIC
Bilirubin Urine: NEGATIVE
GLUCOSE, UA: NEGATIVE mg/dL
HGB URINE DIPSTICK: NEGATIVE
Ketones, ur: NEGATIVE mg/dL
LEUKOCYTES UA: NEGATIVE
NITRITE: NEGATIVE
PROTEIN: 30 mg/dL — AB
SPECIFIC GRAVITY, URINE: 1.024 (ref 1.005–1.030)
pH: 8 (ref 5.0–8.0)

## 2016-01-23 LAB — CBC
HCT: 44.4 % (ref 36.0–46.0)
Hemoglobin: 14.2 g/dL (ref 12.0–15.0)
MCH: 28.7 pg (ref 26.0–34.0)
MCHC: 32 g/dL (ref 30.0–36.0)
MCV: 89.9 fL (ref 78.0–100.0)
PLATELETS: 291 10*3/uL (ref 150–400)
RBC: 4.94 MIL/uL (ref 3.87–5.11)
RDW: 13.1 % (ref 11.5–15.5)
WBC: 6.9 10*3/uL (ref 4.0–10.5)

## 2016-01-23 LAB — I-STAT TROPONIN, ED
TROPONIN I, POC: 0 ng/mL (ref 0.00–0.08)
Troponin i, poc: 0 ng/mL (ref 0.00–0.08)

## 2016-01-23 LAB — BASIC METABOLIC PANEL
Anion gap: 10 (ref 5–15)
BUN: 7 mg/dL (ref 6–20)
CALCIUM: 10.7 mg/dL — AB (ref 8.9–10.3)
CHLORIDE: 99 mmol/L — AB (ref 101–111)
CO2: 28 mmol/L (ref 22–32)
CREATININE: 0.73 mg/dL (ref 0.44–1.00)
Glucose, Bld: 96 mg/dL (ref 65–99)
Potassium: 3.6 mmol/L (ref 3.5–5.1)
SODIUM: 137 mmol/L (ref 135–145)

## 2016-01-23 LAB — URINE MICROSCOPIC-ADD ON

## 2016-01-23 MED ORDER — PROCHLORPERAZINE EDISYLATE 5 MG/ML IJ SOLN
10.0000 mg | Freq: Once | INTRAMUSCULAR | Status: AC
  Administered 2016-01-24: 10 mg via INTRAMUSCULAR
  Filled 2016-01-23: qty 2

## 2016-01-23 MED ORDER — ALUM & MAG HYDROXIDE-SIMETH 200-200-20 MG/5ML PO SUSP
15.0000 mL | Freq: Once | ORAL | Status: AC
  Administered 2016-01-23: 15 mL via ORAL
  Filled 2016-01-23: qty 30

## 2016-01-23 MED ORDER — DIPHENHYDRAMINE HCL 50 MG/ML IJ SOLN
25.0000 mg | Freq: Once | INTRAMUSCULAR | Status: AC
  Administered 2016-01-24: 25 mg via INTRAMUSCULAR
  Filled 2016-01-23: qty 1

## 2016-01-23 MED ORDER — ACETAMINOPHEN 325 MG PO TABS
325.0000 mg | ORAL_TABLET | Freq: Once | ORAL | Status: DC
  Filled 2016-01-23: qty 1

## 2016-01-23 MED ORDER — BUTALBITAL-APAP-CAFFEINE 50-325-40 MG PO TABS
1.0000 | ORAL_TABLET | Freq: Once | ORAL | Status: AC
Start: 2016-01-23 — End: 2016-01-23
  Administered 2016-01-23: 1 via ORAL
  Filled 2016-01-23: qty 1

## 2016-01-23 MED ORDER — LIDOCAINE VISCOUS 2 % MT SOLN
15.0000 mL | Freq: Once | OROMUCOSAL | Status: AC
  Administered 2016-01-23: 15 mL via OROMUCOSAL
  Filled 2016-01-23: qty 15

## 2016-01-23 MED ORDER — IBUPROFEN 800 MG PO TABS
800.0000 mg | ORAL_TABLET | Freq: Once | ORAL | Status: DC
  Filled 2016-01-23: qty 1

## 2016-01-23 NOTE — ED Notes (Signed)
Pt refused tylenol and ibuprofen. Dr. Tyrone Nine notified.

## 2016-01-23 NOTE — ED Provider Notes (Signed)
CSN: [id number]     Arrival date & time 01/23/16  2005 History   First MD Initiated Contact with Patient 01/23/16 2119     Chief Complaint  Patient presents with  . Chest Pain     (Consider location/radiation/quality/duration/timing/severity/associated sxs/prior Treatment) Patient is a 65 y.o. female presenting with chest pain. The history is provided by the patient.  Chest Pain Pain location:  L chest Pain quality: sharp and shooting   Pain radiates to:  Does not radiate Pain radiates to the back: no   Pain severity:  Moderate Onset quality:  Sudden Duration:  1 week Timing:  Constant Progression:  Unchanged Chronicity:  New Relieved by:  Nothing Worsened by:  Nothing tried Ineffective treatments:  None tried Associated symptoms: shortness of breath   Associated symptoms: no dizziness, no fever, no headache, no nausea, no palpitations and not vomiting    65 yo F With a chief complaint chest pain. This is left-sided sharp and shooting. Last only about 10-20 seconds at a time and then resolves. Patient is been having these for the past week. She sees a cardiologist and had recently had a nuclear stress test so that she may have had an old MI.  Past Medical History  Diagnosis Date  . Arrhythmia   . Diverticulitis   . Depression   . Anxiety   . HLD (hyperlipidemia)   . Chronic back pain   . Stroke [facility])     "mini" stroke - 06/2013  . Headache(784.0)     Migraines  . GERD (gastroesophageal reflux disease)   . Arthritis   . Cancer ([place])     basal cell skin cancer  . Anemia   . Constipation    Past Surgical History  Procedure Laterality Date  . Tonsillectomy    . Abdominal hysterectomy    . Breast enhancement surgery    . Rhinoplasty    . Oophorectomy Bilateral   . Bunionectomy Bilateral   . Colonoscopy    . Orif humerus fracture Right 02/15/2015    Procedure: OPEN REDUCTION INTERNAL FIXATION (ORIF) PROXIMAL HUMERUS FRACTURE;  Surgeon: Netta Cedars, MD;  Location:  [place];  Service: Orthopedics;  Laterality: Right;   Family History  Problem Relation Age of Onset  . Lung cancer Mother   . Heart attack Father   . Hypertension Brother   . Esophageal cancer Brother   . Colon cancer Neg Hx   . Diabetes Paternal Grandmother     questionable  . Kidney disease Neg Hx   . Liver disease Neg Hx    Social History  Substance Use Topics  . Smoking status: Current Some Day Smoker    Types: Cigarettes    Last Attempt to Quit: 06/30/2011  . Smokeless tobacco: Never Used  . Alcohol Use: No     Comment: denies    OB History    No data available     Review of Systems  Constitutional: Negative for fever and chills.  HENT: Negative for congestion and rhinorrhea.   Eyes: Negative for redness and visual disturbance.  Respiratory: Positive for shortness of breath. Negative for wheezing.   Cardiovascular: Positive for chest pain. Negative for palpitations.  Gastrointestinal: Negative for nausea and vomiting.  Genitourinary: Negative for dysuria and urgency.  Musculoskeletal: Negative for myalgias and arthralgias.  Skin: Negative for pallor and wound.  Neurological: Negative for dizziness and headaches.      Allergies  Penicillins  Home Medications   Prior to Admission medications  Medication Sig Start Date End Date Taking? Authorizing Provider  Ascorbic Acid (VITAMIN C PO) Take 1 tablet by mouth every evening.   Yes Historical Provider, MD  aspirin EC 81 MG tablet Take 81 mg by mouth every other day.   Yes Historical Provider, MD  bismuth subsalicylate (PEPTO BISMOL) 262 MG chewable tablet Chew 524 mg by mouth as needed for indigestion.   Yes Historical Provider, MD  buPROPion (WELLBUTRIN) 100 MG tablet Take 100 mg by mouth 2 (two) times daily. 01/02/16  Yes Historical Provider, MD  calcium-vitamin D (OSCAL WITH D) 500-200 MG-UNIT per tablet Take 1 tablet by mouth daily with breakfast.   Yes Historical Provider, MD  Cholecalciferol (VITAMIN D PO) Take  1 capsule by mouth every evening.   Yes Historical Provider, MD  clonazePAM (KLONOPIN) 1 MG tablet Take 1 mg by mouth daily as needed (anxiety/sleep.).  12/06/14  Yes Historical Provider, MD  FLUoxetine (PROZAC) 20 MG capsule Take 60 mg by mouth daily.    Yes Historical Provider, MD  levocetirizine (XYZAL ALLERGY 24HR) 5 MG tablet Take 5 mg by mouth every evening.   Yes Historical Provider, MD  lisinopril (PRINIVIL,ZESTRIL) 5 MG tablet Take 1 tablet (5 mg total) by mouth daily. Patient taking differently: Take 2.5 mg by mouth daily.  01/07/16  Yes Dorothy Spark, MD  LUTEIN PO Take 1 capsule by mouth every evening.   Yes Historical Provider, MD  mometasone (NASONEX) 50 MCG/ACT nasal spray Place 2 sprays into both nostrils daily.  11/29/15  Yes Historical Provider, MD  Multiple Vitamins-Minerals (ONE-A-DAY WOMENS 50 PLUS PO) Take 1 tablet by mouth every evening.   Yes Historical Provider, MD  oxyCODONE-acetaminophen (PERCOCET) 10-325 MG tablet Take 1 tablet by mouth every 6 (six) hours. 01/04/16  Yes Historical Provider, MD  RELPAX 40 MG tablet Take 1 tablet by mouth as needed for migraine and may take 1 additional tablet in 2 hours if no relief 01/16/16  Yes Historical Provider, MD  rosuvastatin (CRESTOR) 5 MG tablet Take 1 tablet (5 mg total) by mouth daily. Patient taking differently: Take 2.5 mg by mouth at bedtime.  01/07/16  Yes Dorothy Spark, MD  traZODone (DESYREL) 100 MG tablet Take 200 mg by mouth at bedtime.    Yes Historical Provider, MD  vitamin E 100 UNIT capsule Take 100 Units by mouth every evening.   Yes Historical Provider, MD   BP 144/81 mmHg  Pulse 78  Temp(Src) 97.9 F (36.6 C) (Oral)  Resp 13  Ht 5\' 5"  (1.651 m)  Wt 130 lb (58.968 kg)  BMI 21.63 kg/m2  SpO2 98% Physical Exam  Constitutional: She is oriented to person, place, and time. She appears well-developed and well-nourished. No distress.  HENT:  Head: Normocephalic and atraumatic.  Eyes: EOM are normal. Pupils  are equal, round, and reactive to light.  Neck: Normal range of motion. Neck supple.  Cardiovascular: Normal rate and regular rhythm.  Exam reveals no gallop and no friction rub.   No murmur heard. Pulmonary/Chest: Effort normal. She has no wheezes. She has no rales.  Abdominal: Soft. She exhibits no distension. There is no tenderness.  Musculoskeletal: She exhibits no edema or tenderness.  Neurological: She is alert and oriented to person, place, and time.  Skin: Skin is warm and dry. She is not diaphoretic.  Psychiatric: She has a normal mood and affect. Her behavior is normal.  Nursing note and vitals reviewed.   ED Course  Procedures (including critical care  time) Labs Review Labs Reviewed  BASIC METABOLIC PANEL - Abnormal; Notable for the following:    Chloride 99 (*)    Calcium 10.7 (*)    All other components within normal limits  URINALYSIS, ROUTINE W REFLEX MICROSCOPIC (NOT AT [facility]) - Abnormal; Notable for the following:    APPearance CLOUDY (*)    Protein, ur 30 (*)    All other components within normal limits  URINE MICROSCOPIC-ADD ON - Abnormal; Notable for the following:    Squamous Epithelial / LPF 0-5 (*)    Bacteria, UA RARE (*)    Casts HYALINE CASTS (*)    All other components within normal limits  CBC  I-STAT TROPOININ, ED  Randolm Idol, ED    Imaging Review Dg Chest 2 View  01/23/2016  CLINICAL DATA:  Chest pain and shortness of breath EXAM: CHEST  2 VIEW COMPARISON:  Dec 18, 2015 FINDINGS: Bilateral breast implants with capsular calcifications. The heart, hila, mediastinum, lungs, and pleura are unremarkable with no acute abnormalities. IMPRESSION: No active cardiopulmonary disease. Electronically Signed   By: Dorise Bullion III M.D   On: 01/23/2016 21:20   I have personally reviewed and evaluated these images and lab results as part of my medical decision-making.   EKG Interpretation   Date/Time:  Wednesday January 23 2016 20:12:13 EDT Ventricular  Rate:  89 PR Interval:  132 QRS Duration: 74 QT Interval:  356 QTC Calculation: 433 R Axis:   126 Text Interpretation:   Suspect arm lead reversal, interpretation  assumes no reversal Normal sinus rhythm Possible Left atrial enlargement  Lateral infarct , age undetermined Abnormal ECG No significant change  since last tracing Confirmed by Zacharias Ridling MD, Quillian Quince [phone]) on 01/23/2016  9:31:38 PM      MDM   Final diagnoses:  Chest pain, unspecified chest pain type    65 yo F With a chief complaint of left-sided sharp chest pain. Atypical for cardiac pain. EKG unchanged from prior. Chest x-ray unremarkable.  Delta trop negative.  D/c home.   11:30 PM:  I have discussed the diagnosis/risks/treatment options with the patient and believe the pt to be eligible for discharge home to follow-up with PCP. We also discussed returning to the ED immediately if new or worsening sx occur. We discussed the sx which are most concerning (e.g., sudden worsening pain, fever, inability to tolerate by mouth ) that necessitate immediate return. Medications administered to the patient during their visit and any new prescriptions provided to the patient are listed below.  Medications given during this visit Medications  ibuprofen (ADVIL,MOTRIN) tablet 800 mg (not administered)  acetaminophen (TYLENOL) tablet 325 mg (not administered)  butalbital-acetaminophen-caffeine (FIORICET, ESGIC) 50-325-40 MG per tablet 1 tablet (1 tablet Oral Given 01/23/16 2214)  alum & mag hydroxide-simeth (MAALOX/MYLANTA) 200-200-20 MG/5ML suspension 15 mL (15 mLs Oral Given 01/23/16 2236)  lidocaine (XYLOCAINE) 2 % viscous mouth solution 15 mL (15 mLs Mouth/Throat Given 01/23/16 2236)    New Prescriptions   No medications on file    The patient appears reasonably screen and/or stabilized for discharge and I doubt any other medical condition or other [facility] requiring further screening, evaluation, or treatment in the ED at this time prior  to discharge.      Deno Etienne, DO 01/23/16 2330

## 2016-01-23 NOTE — ED Notes (Signed)
Pt states she started having left sided pressure in her chest yesterday that has been intermittent since then. Pt has also had some sob. Pt is wearing a heart monitor at this time after having a abnormal stress test and recently diagnosed with LVH. Pt is warm and dry at this time. Appears anxious.

## 2016-01-23 NOTE — Telephone Encounter (Signed)
   Pt called to report that she hasn't been feeling well - intermittent chest discomfort, shortness of breath, anxiety.  She isn't sure what she should do.  I recommended that as we can't evaluate chest pain over the phone with things like an ECG or labs, she may be best served by coming to the ER for evaluation.  Caller verbalized understanding and was grateful for the call back.  Murray Hodgkins, NP 01/23/2016, 7:36 PM

## 2016-01-23 NOTE — ED Notes (Signed)
Pt. sts medication for her headache did not help, but made her head hurt worse. EDP made aware.

## 2016-01-23 NOTE — Discharge Instructions (Signed)
Nonspecific Chest Pain  °Chest pain can be caused by many different conditions. There is always a chance that your pain could be related to something serious, such as a heart attack or a blood clot in your lungs. Chest pain can also be caused by conditions that are not life-threatening. If you have chest pain, it is very important to follow up with your health care provider. °CAUSES  °Chest pain can be caused by: °· Heartburn. °· Pneumonia or bronchitis. °· Anxiety or stress. °· Inflammation around your heart (pericarditis) or lung (pleuritis or pleurisy). °· A blood clot in your lung. °· A collapsed lung (pneumothorax). It can develop suddenly on its own (spontaneous pneumothorax) or from trauma to the chest. °· Shingles infection (varicella-zoster virus). °· Heart attack. °· Damage to the bones, muscles, and cartilage that make up your chest wall. This can include: °¨ Bruised bones due to injury. °¨ Strained muscles or cartilage due to frequent or repeated coughing or overwork. °¨ Fracture to one or more ribs. °¨ Sore cartilage due to inflammation (costochondritis). °RISK FACTORS  °Risk factors for chest pain may include: °· Activities that increase your risk for trauma or injury to your chest. °· Respiratory infections or conditions that cause frequent coughing. °· Medical conditions or overeating that can cause heartburn. °· Heart disease or family history of heart disease. °· Conditions or health behaviors that increase your risk of developing a blood clot. °· Having had chicken pox (varicella zoster). °SIGNS AND SYMPTOMS °Chest pain can feel like: °· Burning or tingling on the surface of your chest or deep in your chest. °· Crushing, pressure, aching, or squeezing pain. °· Dull or sharp pain that is worse when you move, cough, or take a deep breath. °· Pain that is also felt in your back, neck, shoulder, or arm, or pain that spreads to any of these areas. °Your chest pain may come and go, or it may stay  constant. °DIAGNOSIS °Lab tests or other studies may be needed to find the cause of your pain. Your health care provider may have you take a test called an ambulatory ECG (electrocardiogram). An ECG records your heartbeat patterns at the time the test is performed. You may also have other tests, such as: °· Transthoracic echocardiogram (TTE). During echocardiography, sound waves are used to create a picture of all of the heart structures and to look at how blood flows through your heart. °· Transesophageal echocardiogram (TEE). This is a more advanced imaging test that obtains images from inside your body. It allows your health care provider to see your heart in finer detail. °· Cardiac monitoring. This allows your health care provider to monitor your heart rate and rhythm in real time. °· Holter monitor. This is a portable device that records your heartbeat and can help to diagnose abnormal heartbeats. It allows your health care provider to track your heart activity for several days, if needed. °· Stress tests. These can be done through exercise or by taking medicine that makes your heart beat more quickly. °· Blood tests. °· Imaging tests. °TREATMENT  °Your treatment depends on what is causing your chest pain. Treatment may include: °· Medicines. These may include: °¨ Acid blockers for heartburn. °¨ Anti-inflammatory medicine. °¨ Pain medicine for inflammatory conditions. °¨ Antibiotic medicine, if an infection is present. °¨ Medicines to dissolve blood clots. °¨ Medicines to treat coronary artery disease. °· Supportive care for conditions that do not require medicines. This may include: °¨ Resting. °¨ Applying heat   or cold packs to injured areas. °¨ Limiting activities until pain decreases. °HOME CARE INSTRUCTIONS °· If you were prescribed an antibiotic medicine, finish it all even if you start to feel better. °· Avoid any activities that bring on chest pain. °· Do not use any tobacco products, including  cigarettes, chewing tobacco, or electronic cigarettes. If you need help quitting, ask your health care provider. °· Do not drink alcohol. °· Take medicines only as directed by your health care provider. °· Keep all follow-up visits as directed by your health care provider. This is important. This includes any further testing if your chest pain does not go away. °· If heartburn is the cause for your chest pain, you may be told to keep your head raised (elevated) while sleeping. This reduces the chance that acid will go from your stomach into your esophagus. °· Make lifestyle changes as directed by your health care provider. These may include: °¨ Getting regular exercise. Ask your health care provider to suggest some activities that are safe for you. °¨ Eating a heart-healthy diet. A registered dietitian can help you to learn healthy eating options. °¨ Maintaining a healthy weight. °¨ Managing diabetes, if necessary. °¨ Reducing stress. °SEEK MEDICAL CARE IF: °· Your chest pain does not go away after treatment. °· You have a rash with blisters on your chest. °· You have a fever. °SEEK IMMEDIATE MEDICAL CARE IF:  °· Your chest pain is worse. °· You have an increasing cough, or you cough up blood. °· You have severe abdominal pain. °· You have severe weakness. °· You faint. °· You have chills. °· You have sudden, unexplained chest discomfort. °· You have sudden, unexplained discomfort in your arms, back, neck, or jaw. °· You have shortness of breath at any time. °· You suddenly start to sweat, or your skin gets clammy. °· You feel nauseous or you vomit. °· You suddenly feel light-headed or dizzy. °· Your heart begins to beat quickly, or it feels like it is skipping beats. °These symptoms may represent a serious problem that is an emergency. Do not wait to see if the symptoms will go away. Get medical help right away. Call your local emergency services (911 in the [place] Do not drive yourself to the hospital. °  °This  information is not intended to replace advice given to you by your health care provider. Make sure you discuss any questions you have with your health care provider. °  °Document Released: 04/23/2005 Document Revised: 08/04/2014 Document Reviewed: 02/17/2014 °Elsevier Interactive Patient Education ©2016 [employer] ° °

## 2016-01-24 ENCOUNTER — Encounter (HOSPITAL_COMMUNITY): Payer: Self-pay | Admitting: Emergency Medicine

## 2016-01-24 ENCOUNTER — Emergency Department (HOSPITAL_COMMUNITY)
Admission: EM | Admit: 2016-01-24 | Discharge: 2016-01-24 | Disposition: A | Discharge status: Home / Self Care | Payer: BLUE CROSS/BLUE SHIELD | Source: Home / Self Care

## 2016-01-24 DIAGNOSIS — Z5321 Procedure and treatment not carried out due to patient leaving prior to being seen by health care provider: Secondary | ICD-10-CM

## 2016-01-24 DIAGNOSIS — G43909 Migraine, unspecified, not intractable, without status migrainosus: Secondary | ICD-10-CM

## 2016-01-24 NOTE — ED Notes (Signed)
Bed: WA05 Expected date:  Expected time:  Means of arrival:  Comments: 

## 2016-01-24 NOTE — ED Notes (Signed)
Pt called x 1 w/o answer. Not seen in lobby.

## 2016-01-24 NOTE — ED Notes (Signed)
Pt states that she has had a very bad migraine since Monday. Took her Relpax this morning w/o relief. Neuro intact. Alert and oriented.

## 2016-02-05 ENCOUNTER — Telehealth: Payer: Self-pay | Admitting: *Deleted

## 2016-02-05 NOTE — Telephone Encounter (Signed)
-----   Message from Dorothy Spark, MD sent at 02/05/2016 10:44 AM EDT ----- Normal 30 day event monitor, no arrhythmias.   ----- Message -----    From: Dorothy Spark, MD    Sent: 02/05/2016  10:43 AM      To: Dorothy Spark, MD

## 2016-02-05 NOTE — Telephone Encounter (Signed)
Left message to call back  

## 2016-02-06 ENCOUNTER — Other Ambulatory Visit (INDEPENDENT_AMBULATORY_CARE_PROVIDER_SITE_OTHER): Payer: BLUE CROSS/BLUE SHIELD | Admitting: *Deleted

## 2016-02-06 DIAGNOSIS — E785 Hyperlipidemia, unspecified: Secondary | ICD-10-CM

## 2016-02-06 DIAGNOSIS — I519 Heart disease, unspecified: Secondary | ICD-10-CM | POA: Diagnosis not present

## 2016-02-06 LAB — COMPREHENSIVE METABOLIC PANEL
ALT: 16 U/L (ref 6–29)
AST: 18 U/L (ref 10–35)
Albumin: 3.9 g/dL (ref 3.6–5.1)
Alkaline Phosphatase: 101 U/L (ref 33–130)
BUN: 21 mg/dL (ref 7–25)
CO2: 24 mmol/L (ref 20–31)
Calcium: 9.2 mg/dL (ref 8.6–10.4)
Chloride: 105 mmol/L (ref 98–110)
Creat: 0.66 mg/dL (ref 0.50–0.99)
Glucose, Bld: 110 mg/dL — ABNORMAL HIGH (ref 65–99)
Potassium: 4 mmol/L (ref 3.5–5.3)
Sodium: 140 mmol/L (ref 135–146)
Total Bilirubin: 0.3 mg/dL (ref 0.2–1.2)
Total Protein: 6.3 g/dL (ref 6.1–8.1)

## 2016-02-06 NOTE — Telephone Encounter (Signed)
Left a message for the pt to call back to endorse normal event monitor results per Dr [name].

## 2016-02-06 NOTE — Telephone Encounter (Signed)
Notified the pt that per Dr [name], she had a normal 30 day event monitor with no arrhythmias.  Pt verbalized understanding.

## 2016-02-06 NOTE — Addendum Note (Signed)
Addended by: Eulis Foster on: 02/06/2016 09:06 AM   Modules accepted: Orders

## 2016-02-14 ENCOUNTER — Telehealth: Payer: Self-pay | Admitting: Cardiology

## 2016-02-14 NOTE — Telephone Encounter (Signed)
SPOKE WITH  PT .PT CALLED COMPLAINING  WITH  DIZZINESS WORSENS WITH POSITION  CHANGE , LOW  B/P  ,BUT  PT HAS  NOT  CHECKED  B/P AND   NOTES NAUSEA. PT  STATES HAS NOT   LEFT  THE   HOUSE   IN QUITE SOME  TIME  AND IS FEARFUL OF  DRIVING  WITH  FEELING  THIS  WAY .WILL FORWARD TO  DR   [name]  FOR  RECOMMENDATIONS .Adonis Housekeeper

## 2016-02-14 NOTE — Telephone Encounter (Signed)
New Message  Pt c/o nausea, low BP, dizziness- she stated may be due to lisinopril and crestor. Please call back and discuss.

## 2016-02-14 NOTE — Telephone Encounter (Signed)
Please call her tomorrow (Friday 7/20) to see if she still has symptoms and if yes, I cann see her in the afternoon at 2 or 2:15, thank you

## 2016-02-14 NOTE — Telephone Encounter (Signed)
LM TO CALL BACK ./CY 

## 2016-02-15 ENCOUNTER — Encounter: Payer: Self-pay | Admitting: Cardiology

## 2016-02-15 ENCOUNTER — Encounter: Payer: Self-pay | Admitting: *Deleted

## 2016-02-15 ENCOUNTER — Ambulatory Visit (INDEPENDENT_AMBULATORY_CARE_PROVIDER_SITE_OTHER): Payer: BLUE CROSS/BLUE SHIELD | Admitting: Cardiology

## 2016-02-15 VITALS — BP 142/85 | HR 75 | Ht 65.0 in | Wt 127.0 lb

## 2016-02-15 DIAGNOSIS — R42 Dizziness and giddiness: Secondary | ICD-10-CM

## 2016-02-15 DIAGNOSIS — R110 Nausea: Secondary | ICD-10-CM

## 2016-02-15 DIAGNOSIS — R079 Chest pain, unspecified: Secondary | ICD-10-CM

## 2016-02-15 DIAGNOSIS — E785 Hyperlipidemia, unspecified: Secondary | ICD-10-CM

## 2016-02-15 DIAGNOSIS — R0609 Other forms of dyspnea: Secondary | ICD-10-CM

## 2016-02-15 DIAGNOSIS — R9439 Abnormal result of other cardiovascular function study: Secondary | ICD-10-CM | POA: Diagnosis not present

## 2016-02-15 DIAGNOSIS — I119 Hypertensive heart disease without heart failure: Secondary | ICD-10-CM

## 2016-02-15 LAB — CBC WITH DIFFERENTIAL/PLATELET
Basophils Absolute: 0 cells/uL (ref 0–200)
Basophils Relative: 0 %
Eosinophils Absolute: 232 cells/uL (ref 15–500)
Eosinophils Relative: 4 %
HCT: 39.9 % (ref 35.0–45.0)
Hemoglobin: 13 g/dL (ref 11.7–15.5)
Lymphocytes Relative: 27 %
Lymphs Abs: 1566 cells/uL (ref 850–3900)
MCH: 28.6 pg (ref 27.0–33.0)
MCHC: 32.6 g/dL (ref 32.0–36.0)
MCV: 87.9 fL (ref 80.0–100.0)
MPV: 10 fL (ref 7.5–12.5)
Monocytes Absolute: 464 cells/uL (ref 200–950)
Monocytes Relative: 8 %
Neutro Abs: 3538 cells/uL (ref 1500–7800)
Neutrophils Relative %: 61 %
Platelets: 245 10*3/uL (ref 140–400)
RBC: 4.54 MIL/uL (ref 3.80–5.10)
RDW: 13.4 % (ref 11.0–15.0)
WBC: 5.8 10*3/uL (ref 3.8–10.8)

## 2016-02-15 LAB — BASIC METABOLIC PANEL
BUN: 6 mg/dL — ABNORMAL LOW (ref 7–25)
CO2: 36 mmol/L — ABNORMAL HIGH (ref 20–31)
Calcium: 9.1 mg/dL (ref 8.6–10.4)
Chloride: 99 mmol/L (ref 98–110)
Creat: 0.95 mg/dL (ref 0.50–0.99)
Glucose, Bld: 77 mg/dL (ref 65–99)
Potassium: 4 mmol/L (ref 3.5–5.3)
Sodium: 145 mmol/L (ref 135–146)

## 2016-02-15 IMAGING — CR DG KNEE 1-2V*L*
2 series · 2 of 2 positions shown · non-contrast
Comparison: None.

CLINICAL DATA: Knee pain

EXAM:
LEFT KNEE - 1-2 VIEW

[view not recorded (1 of 2)]
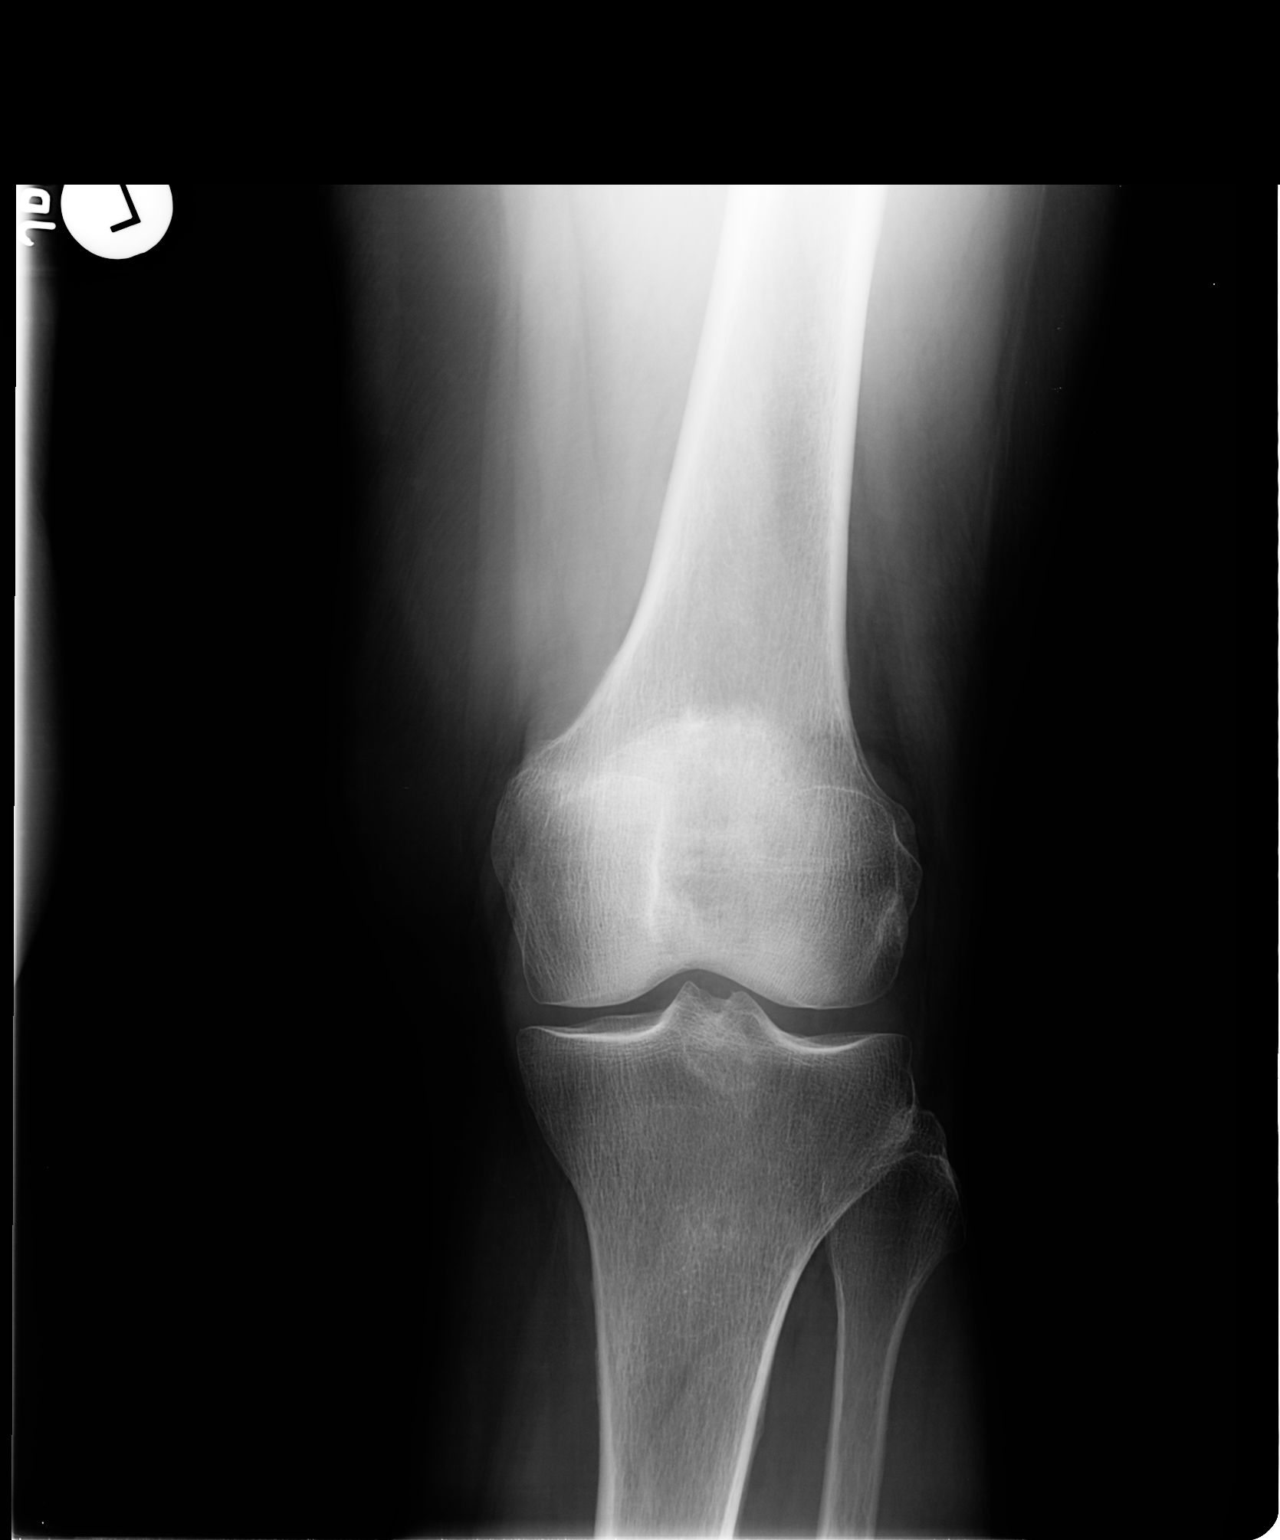

[view not recorded (2 of 2)]
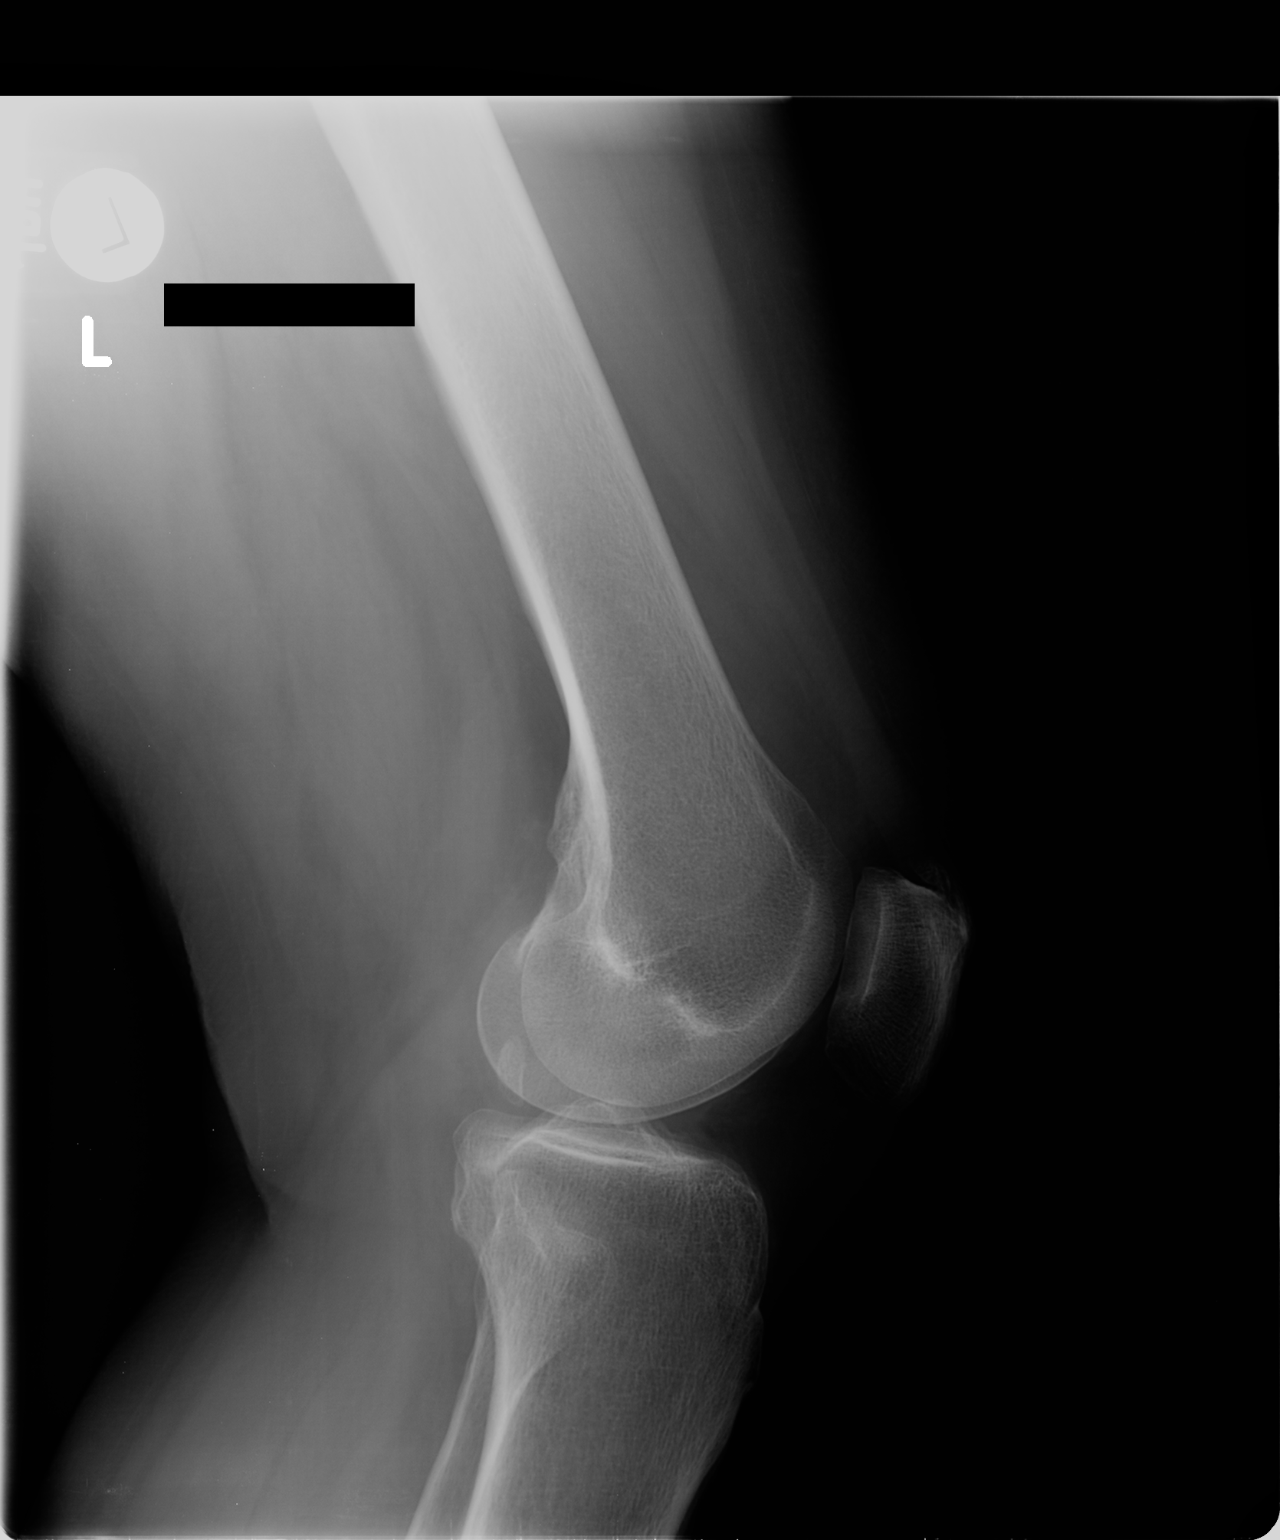

[2 of 2 positions shown; findings below may reference images not displayed]

FINDINGS: There is no evidence of fracture, dislocation, or joint effusion.
There is no evidence of arthropathy or other focal bone abnormality.
Soft tissues are unremarkable.
IMPRESSION: Negative.

## 2016-02-15 MED ORDER — CLONAZEPAM 1 MG PO TABS: 1.0000 mg | ORAL_TABLET | Freq: Every day | ORAL | Status: DC | PRN

## 2016-02-15 NOTE — Telephone Encounter (Signed)
Pt states she just woke up this morning so she is not aware of any dizziness yet.  Pt states she had dizziness all week, would like to see Dr [name] this afternoon.  Pt scheduled to see Dr [name] today at 2 PM. Pt advised not to drive to appt this afternoon

## 2016-02-15 NOTE — Patient Instructions (Signed)
Medication Instructions:  1. STOP LISINOPRIL 2. STOP CRESTOR 3. YOU HAVE BEEN GIVEN AN RX FOR KLONOPIN  Labwork: 1. TODAY BMET, CBC W/DIFF, PT/INR  Testing/Procedures: Your physician has requested that you have a cardiac catheterization. Cardiac catheterization is used to diagnose and/or treat various heart conditions. Doctors may recommend this procedure for a number of different reasons. The most common reason is to evaluate chest pain. Chest pain can be a symptom of coronary artery disease (CAD), and cardiac catheterization can show whether plaque is narrowing or blocking your heart's arteries. This procedure is also used to evaluate the valves, as well as measure the blood flow and oxygen levels in different parts of your heart. For further information please visit [facility]. Please follow instruction sheet, as given.   Follow-Up: 2-3 WEEK FOLLOW S/P CATH WITH DR. [name] OR PA/NP   Any Other Special Instructions Will Be Listed Below (If Applicable).     If you need a refill on your cardiac medications before your next appointment, please call your pharmacy.

## 2016-02-15 NOTE — Progress Notes (Signed)
Cardiology Office Note    Date:  02/15/2016   ID:  Dorothy White, DOB April 22, 1951, MRN [id number]  PCP:  Jonathon Bellows, MD  Cardiologist:   Ena Dawley, MD  Referring physician: Jonathon Bellows, MD   Chief complain: Chest pain, dyspnea on exertion, dizziness.  History of Present Illness:  Dorothy White is a 65 y.o. female  with medical history significant of depression and migraine Presented to the emergency room on 12/21/2015 with falling on her side while walking slurred speech and blurry vision. This was during an episode of migraine headache. No neurologic deficit on presentation normal brain MRI/MRA, normal MRI of the spine. The symptoms have resolved with treatment of migraine and her gait dysfunction has resolved. It was concluded by neurology that this was a presentation of complicated migraine - ataxia. The patient states that she has noticed recently that she is more short of breath on exertion especially when walking stairs or uphill. She doesn't exercise but is retired and is able to do independently goal activities of daily living. She has occasional palpitations that are not related to exertion and they will last approximately a minute. She has no associated dizziness or syncope with palpitations. Her father died in his 52s possibly to myocardial infarction. Her mom died of lung cancer in her brother is currently dealing with lung cancer as well. She has never smoked. Long time ago she was told that she has mitral valve prolapse and arrhythmia but doesn't remember what type. While in the hospital she was found to have LVEF of 40-45% with inferior hypokinesis and was referred to [place] for further evaluation.  At the last visit we schedule a pharmacologic nuclear stress test That showed less than 1 mm horizontal ST segment depressions with T-wave inversions in inferolateral leads during Lexiscan infusion, and medium defect of moderate severity present in the basal inferoseptal  mid interseptal apical anterior location however it but the defect was described as nonreversible and no ischemia noted.  The patient was given prescription for lisinopril for her LV dysfunction and hypertension as well as Crestor for finding of old infarction consistent with a history of coronary artery disease. The patient called yesterday and states that she feels profoundly short of breath and has pressure behind her sternum when she does minimal activities and ever since she started to take his medication she feels really tired and nauseous all the time. She also feels dizzy when she stands up.  Past Medical History  Diagnosis Date  . Arrhythmia   . Diverticulitis   . Depression   . Anxiety   . HLD (hyperlipidemia)   . Chronic back pain   . Stroke [facility])     "mini" stroke - 06/2013  . Headache(784.0)     Migraines  . GERD (gastroesophageal reflux disease)   . Arthritis   . Cancer ([place])     basal cell skin cancer  . Anemia   . Constipation     Past Surgical History  Procedure Laterality Date  . Tonsillectomy    . Abdominal hysterectomy    . Breast enhancement surgery    . Rhinoplasty    . Oophorectomy Bilateral   . Bunionectomy Bilateral   . Colonoscopy    . Orif humerus fracture Right 02/15/2015    Procedure: OPEN REDUCTION INTERNAL FIXATION (ORIF) PROXIMAL HUMERUS FRACTURE;  Surgeon: Netta Cedars, MD;  Location: [place];  Service: Orthopedics;  Laterality: Right;   Current Medications: Outpatient Prescriptions Prior to  Visit  Medication Sig Dispense Refill  . Ascorbic Acid (VITAMIN C PO) Take 1 tablet by mouth every evening.    Marland Kitchen aspirin EC 81 MG tablet Take 81 mg by mouth every other day.    . calcium-vitamin D (OSCAL WITH D) 500-200 MG-UNIT per tablet Take 1 tablet by mouth daily with breakfast.    . Cholecalciferol (VITAMIN D PO) Take 1 capsule by mouth every evening.    Marland Kitchen FLUoxetine (PROZAC) 20 MG capsule Take 60 mg by mouth daily.     Marland Kitchen levocetirizine (XYZAL  ALLERGY 24HR) 5 MG tablet Take 5 mg by mouth every evening.    . LUTEIN PO Take 1 capsule by mouth every evening.    . Multiple Vitamins-Minerals (ONE-A-DAY WOMENS 50 PLUS PO) Take 1 tablet by mouth every evening.    Marland Kitchen RELPAX 40 MG tablet Take 1 tablet by mouth as needed for migraine and may take 1 additional tablet in 2 hours if no relief  11  . traZODone (DESYREL) 100 MG tablet Take 200 mg by mouth at bedtime.     . vitamin E 100 UNIT capsule Take 100 Units by mouth every evening.    . bismuth subsalicylate (PEPTO BISMOL) 262 MG chewable tablet Chew 524 mg by mouth as needed for indigestion.    Marland Kitchen buPROPion (WELLBUTRIN) 100 MG tablet Take 100 mg by mouth 2 (two) times daily.  3  . clonazePAM (KLONOPIN) 1 MG tablet Take 1 mg by mouth daily as needed (anxiety/sleep.).   3  . lisinopril (PRINIVIL,ZESTRIL) 5 MG tablet Take 1 tablet (5 mg total) by mouth daily. (Patient taking differently: Take 2.5 mg by mouth daily. ) 90 tablet 3  . mometasone (NASONEX) 50 MCG/ACT nasal spray Place 2 sprays into both nostrils daily.   11  . oxyCODONE-acetaminophen (PERCOCET) 10-325 MG tablet Take 1 tablet by mouth every 6 (six) hours.  0  . rosuvastatin (CRESTOR) 5 MG tablet Take 1 tablet (5 mg total) by mouth daily. (Patient taking differently: Take 2.5 mg by mouth at bedtime. ) 90 tablet 3   No facility-administered medications prior to visit.    Allergies:   Penicillins   Social History   Social History  . Marital Status: Divorced    Spouse Name: N/A  . Number of Children: 0  . Years of Education: 12   Occupational History  . retired     Retired  .     Social History Main Topics  . Smoking status: Former Smoker    Types: Cigarettes    Quit date: 06/30/2011  . Smokeless tobacco: Never Used  . Alcohol Use: No     Comment: denies   . Drug Use: No  . Sexual Activity: No   Other Topics Concern  . None   Social History Narrative   Patient lives at home alone.    Patient is retired.    Education- High school.   Right handed.   Patient drinks one cup of coffee and a big glass of ice tea.    Family History:  The patient's family history includes Diabetes in her paternal grandmother; Esophageal cancer in her brother; Heart attack in her father; Hypertension in her brother; Lung cancer in her mother. There is no history of Colon cancer, Kidney disease, or Liver disease.   ROS:   Please see the history of present illness.    ROS All other systems reviewed and are negative.  PHYSICAL EXAM:   VS:  BP 142/85 mmHg  Pulse 75  Ht 5\' 5"  (1.651 m)  Wt 127 lb (57.607 kg)  BMI 21.13 kg/m2   GEN: Well nourished, well developed, in no acute distress HEENT: normal Neck: no JVD, carotid bruits, or masses Cardiac: RRR; no murmurs, rubs, or gallops,no edema  Respiratory:  clear to auscultation bilaterally, normal work of breathing GI: soft, nontender, nondistended, + BS MS: no deformity or atrophy Skin: warm and dry, no rash Neuro:  Alert and Oriented x 3, Strength and sensation are intact Psych: euthymic mood, full affect  Wt Readings from Last 3 Encounters:  02/15/16 127 lb (57.607 kg)  01/23/16 130 lb (58.968 kg)  01/03/16 128 lb (58.06 kg)      Studies/Labs Reviewed:   EKG:   From 12/17/2015 shows normal sinus rhythm nonspecific ST-T wave abnormalities but otherwise normal EKG.  Recent Labs: 01/23/2016: Hemoglobin 14.2; Platelets 291 02/06/2016: ALT 16; BUN 21; Creat 0.66; Potassium 4.0; Sodium 140   Lipid Panel    Component Value Date/Time   CHOL 156 12/18/2015 0510   CHOL 243* 08/15/2013 0839   TRIG 100 12/18/2015 0510   HDL 47 12/18/2015 0510   HDL 55 08/15/2013 0839   CHOLHDL 3.3 12/18/2015 0510   VLDL 20 12/18/2015 0510   LDLCALC 89 12/18/2015 0510   LDLCALC 142* 08/15/2013 0839   TTE: 12/18/2015 - Left ventricle: The cavity size was normal. Systolic function was  mildly to moderately reduced. The estimated ejection fraction was  in the range of 40% to  45%. Moderate hypokinesis of the  basalinferior myocardium. - Pulmonary arteries: Systolic pressure was mildly to moderately  increased. PA peak pressure: 40 mm Hg (S).  Carotid US: - The vertebral arteries appear patent with antegrade flow. - Findings consistent with 1- 39 percent stenosis involving the  right internal carotid artery and the left internal carotid  artery.    ASSESSMENT:    1. Chest pain, unspecified chest pain type   2. DOE (dyspnea on exertion)   3. Dizziness   4. Abnormal stress test   5. Hypertensive heart disease without CHF   6. Hyperlipidemia      PLAN:  In order of problems listed above:  This is a 66 year old patient with significant family history of premature coronary artery disease in her father who is coming with symptoms of worsening knee on exertion her baseline EKG shows ST depressions and negative T waves in the anterolateral leads suspicious for ischemia this is different from EKG 2 years ago. She also had worsening ST depression during Lexiscan infusion. I have reviewed her nuclear imaging from her stress test and addition to prior infarct in the inferior and inferoseptal walls but there is also minimal ischemia in the basal and mid inferolateral wall.  He is also order static daily with her lying blood pressure 142/85, sitting 120/68, standing 90/60. I will discontinue lisinopril but also Crestor since I don't know which one of dose is causing additional nausea.  I would schedule this patient for a left cardiac catheterization to rule out significant obstructive coronary artery disease and also to determine future management with statin versus PCSK-( is significant plaque and intolerance to statin.  We will recheck her blood pressure in the hospital and if still elevated, try to introduce another blood pressure lowering agent at that time.  Medication Adjustments/Labs and Tests Ordered: Current medicines are reviewed at length with the  patient today.  Concerns regarding medicines are outlined above.  Medication changes, Labs and Tests ordered today are  listed in the Patient Instructions below. Patient Instructions  Medication Instructions:  1. STOP LISINOPRIL 2. STOP CRESTOR 3. YOU HAVE BEEN GIVEN AN RX FOR KLONOPIN  Labwork: 1. TODAY BMET, CBC W/DIFF, PT/INR  Testing/Procedures: Your physician has requested that you have a cardiac catheterization. Cardiac catheterization is used to diagnose and/or treat various heart conditions. Doctors may recommend this procedure for a number of different reasons. The most common reason is to evaluate chest pain. Chest pain can be a symptom of coronary artery disease (CAD), and cardiac catheterization can show whether plaque is narrowing or blocking your heart's arteries. This procedure is also used to evaluate the valves, as well as measure the blood flow and oxygen levels in different parts of your heart. For further information please visit [email or web address]. Please follow instruction sheet, as given.   Follow-Up: 2-3 WEEK FOLLOW S/P CATH WITH DR. [name] OR PA/NP   Any Other Special Instructions Will Be Listed Below (If Applicable).     If you need a refill on your cardiac medications before your next appointment, please call your pharmacy.       Signed, Ena Dawley, MD  02/15/2016 3:49 PM    [address], [place], [place]  [address] Phone: [phone]; Fax: [phone]

## 2016-02-16 LAB — PROTIME-INR
INR: 1
Prothrombin Time: 10.5 s (ref 9.0–11.5)

## 2016-02-16 IMAGING — US US EXTREM LOW VENOUS BILAT
1 series · 13 of 24 positions shown · non-contrast
Comparison: None.

CLINICAL DATA: Bilateral lower extremity pain and swelling,
evaluate for DVT or Baker's cyst.



[Series 1: us extrem low venous bilat · 13 of 55 slices shown]
[im 1/55]
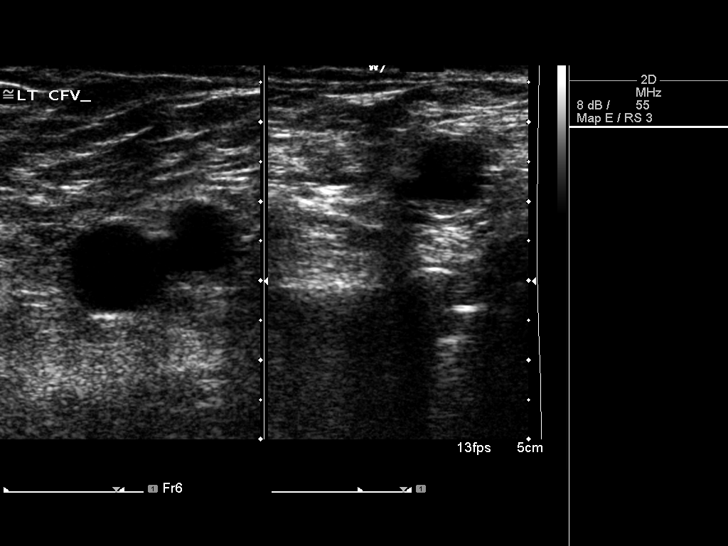
[im 5/55]
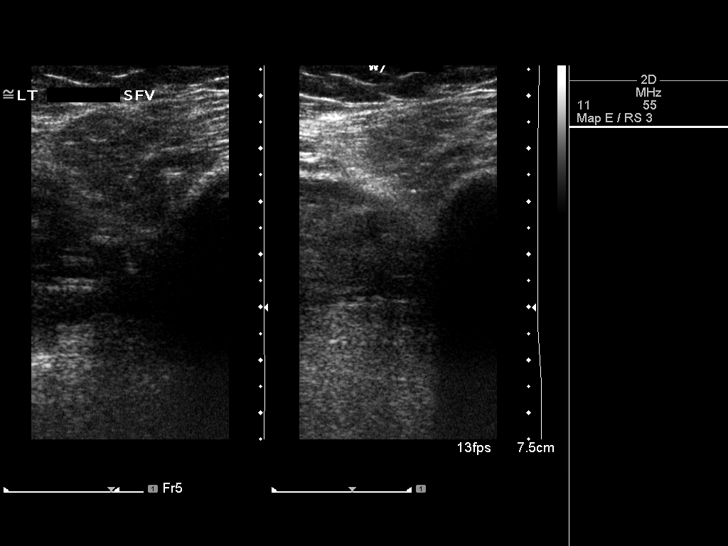
[im 10/55]
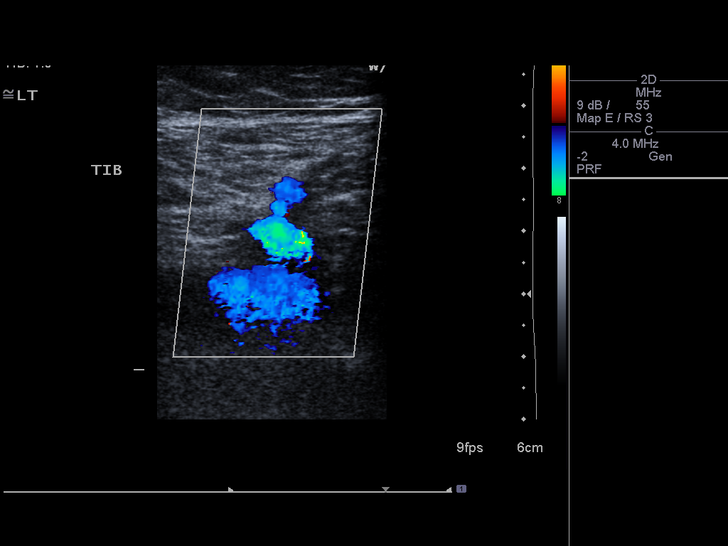
[im 15/55]
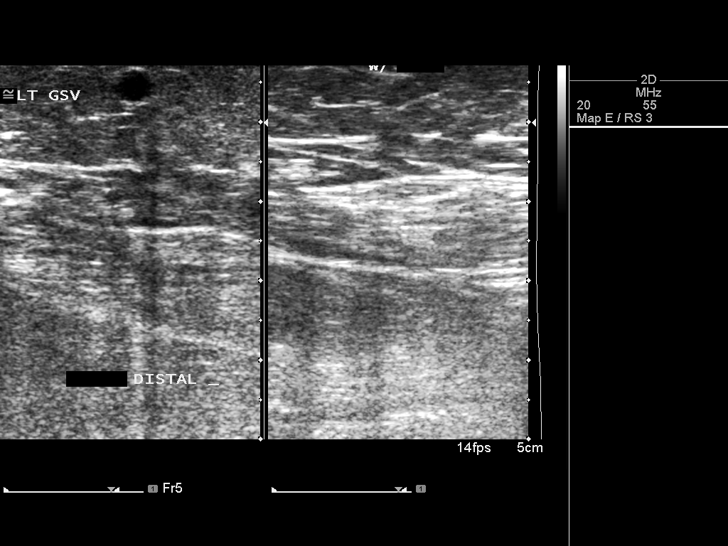
[im 19/55]
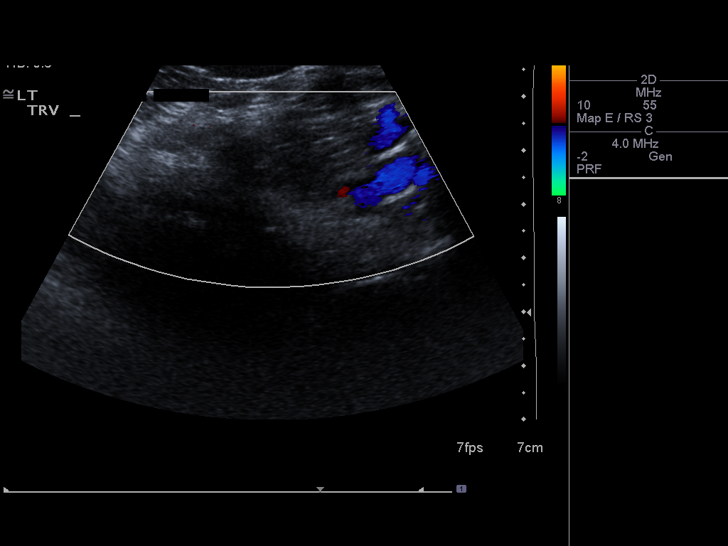
[im 24/55]
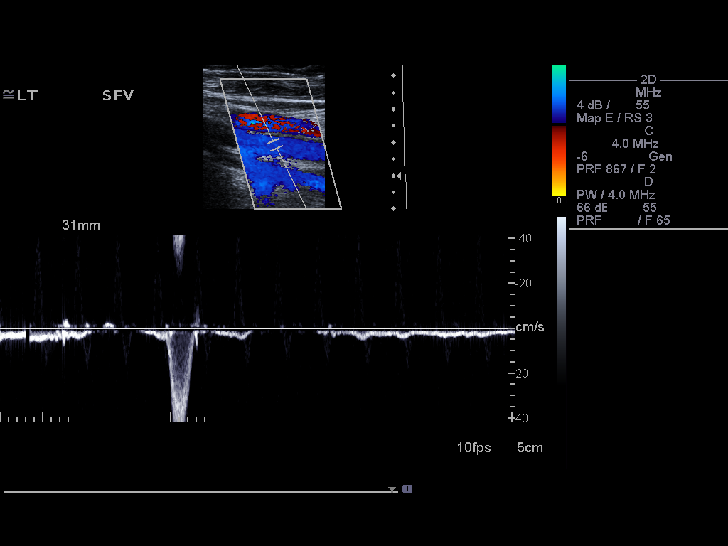
[im 29/55]
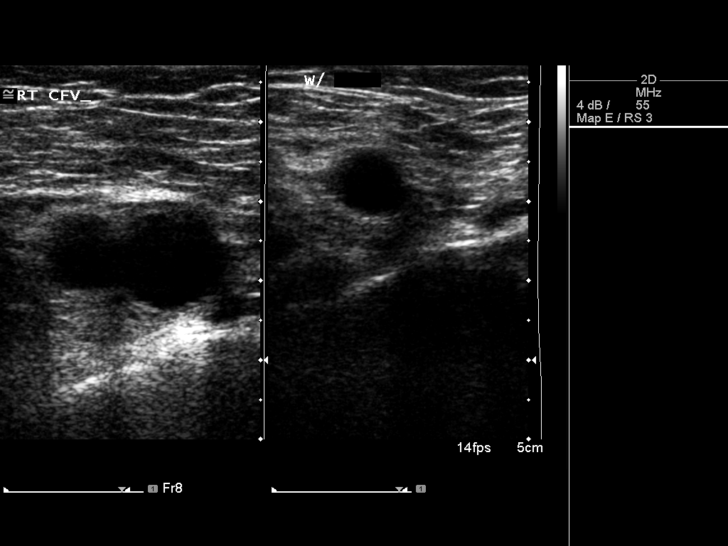
[im 31/55]
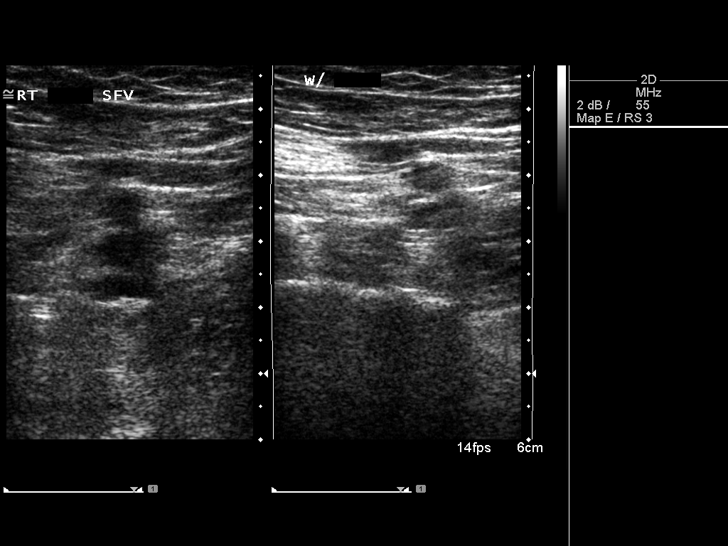
[im 36/55]
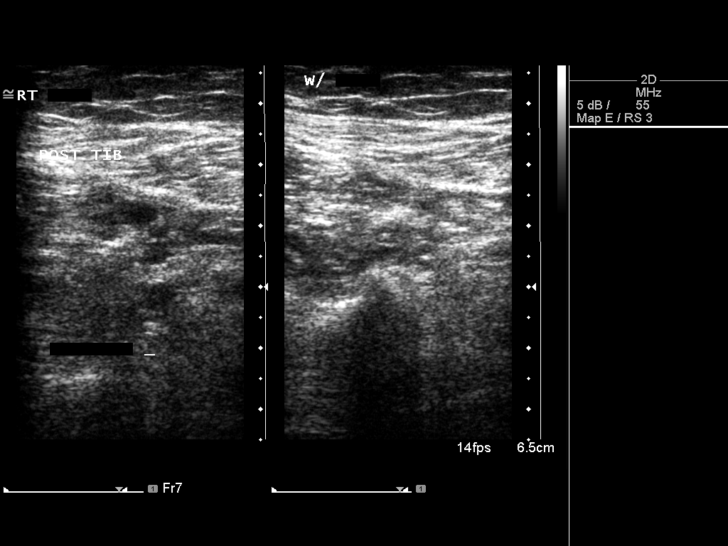
[im 40/55]
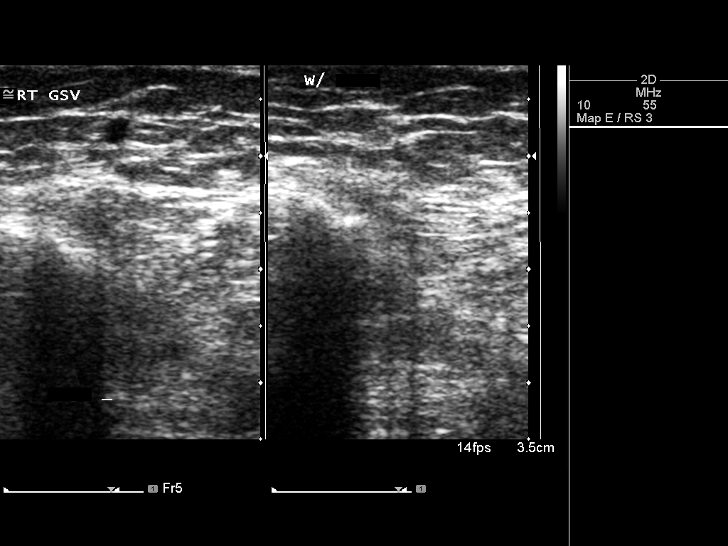
[im 45/55]
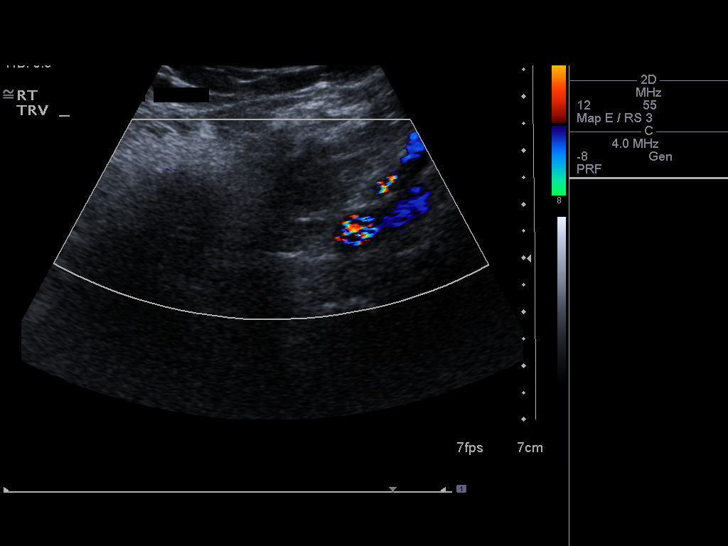
[im 50/55]
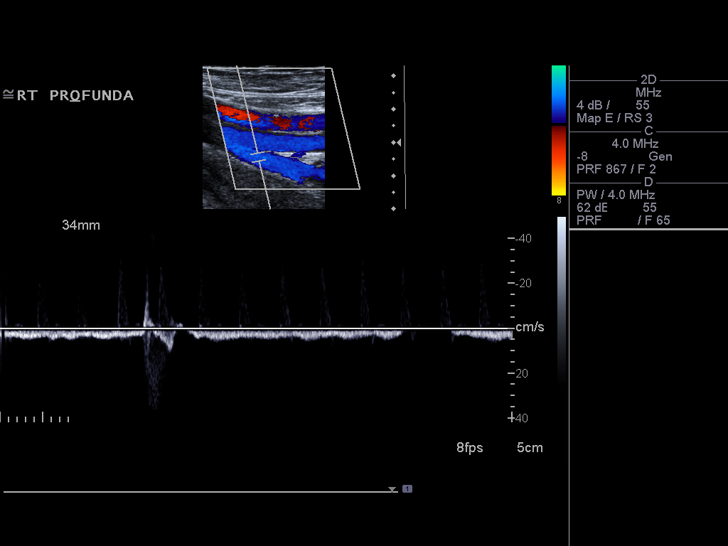
[im 55/55]
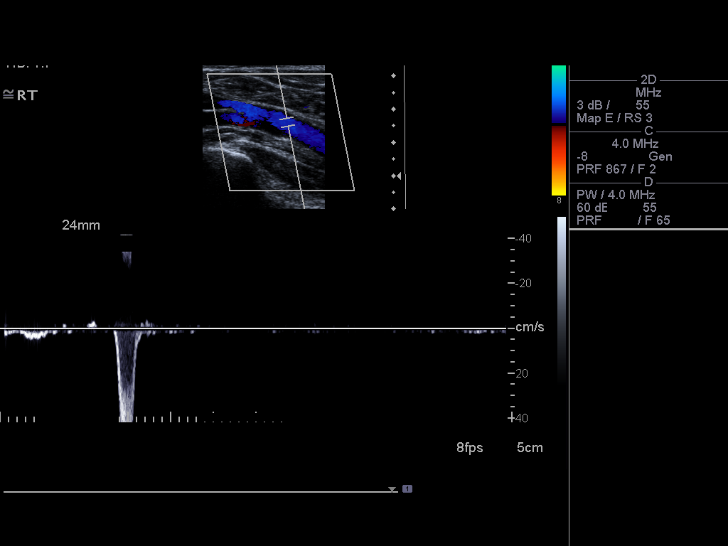

[13 of 24 positions shown; findings below may reference images not displayed]

FINDINGS: RIGHT LOWER EXTREMITY

Common Femoral Vein: No evidence of thrombus. Normal
compressibility, respiratory phasicity and response to augmentation.

Saphenofemoral Junction: No evidence of thrombus. Normal
compressibility and flow on color Doppler imaging.

Profunda Femoral Vein: No evidence of thrombus. Normal
compressibility and flow on color Doppler imaging.

Femoral Vein: No evidence of thrombus. Normal compressibility,
respiratory phasicity and response to augmentation.

Popliteal Vein: No evidence of thrombus. Normal compressibility,
respiratory phasicity and response to augmentation.

Calf Veins: No evidence of thrombus. Normal compressibility and flow
on color Doppler imaging.

Superficial Great Saphenous Vein: No evidence of thrombus. Normal
compressibility and flow on color Doppler imaging.

Venous Reflux:  None.

Other Findings:  None.

LEFT LOWER EXTREMITY

Common Femoral Vein: No evidence of thrombus. Normal
compressibility, respiratory phasicity and response to augmentation.

Saphenofemoral Junction: No evidence of thrombus. Normal
compressibility and flow on color Doppler imaging.

Profunda Femoral Vein: No evidence of thrombus. Normal
compressibility and flow on color Doppler imaging.

Femoral Vein: No evidence of thrombus. Normal compressibility,
respiratory phasicity and response to augmentation.

Popliteal Vein: No evidence of thrombus. Normal compressibility,
respiratory phasicity and response to augmentation.

Calf Veins: No evidence of thrombus. Normal compressibility and flow
on color Doppler imaging.

Superficial Great Saphenous Vein: No evidence of thrombus. Normal
compressibility and flow on color Doppler imaging.

Venous Reflux:  None.

Other Findings:  None.
IMPRESSION: No evidence of DVT within either lower extremity. No evidence of a
Baker cyst within either lower extremity.

## 2016-02-17 DIAGNOSIS — I209 Angina pectoris, unspecified: Secondary | ICD-10-CM | POA: Diagnosis present

## 2016-02-17 DIAGNOSIS — R9439 Abnormal result of other cardiovascular function study: Secondary | ICD-10-CM | POA: Diagnosis present

## 2016-02-20 ENCOUNTER — Encounter (HOSPITAL_COMMUNITY)
Admission: RE | Disposition: A | Discharge status: Home / Self Care | Payer: Self-pay | Source: Ambulatory Visit | Attending: Cardiology

## 2016-02-20 ENCOUNTER — Encounter (HOSPITAL_COMMUNITY): Payer: Self-pay

## 2016-02-20 ENCOUNTER — Ambulatory Visit (HOSPITAL_COMMUNITY)
Admission: RE | Admit: 2016-02-20 | Discharge: 2016-02-21 | Disposition: A | Discharge status: Home / Self Care | Payer: BLUE CROSS/BLUE SHIELD | Source: Ambulatory Visit | Attending: Cardiology | Admitting: Cardiology

## 2016-02-20 DIAGNOSIS — Z85828 Personal history of other malignant neoplasm of skin: Secondary | ICD-10-CM | POA: Insufficient documentation

## 2016-02-20 DIAGNOSIS — E785 Hyperlipidemia, unspecified: Secondary | ICD-10-CM | POA: Diagnosis not present

## 2016-02-20 DIAGNOSIS — Z8673 Personal history of transient ischemic attack (TIA), and cerebral infarction without residual deficits: Secondary | ICD-10-CM | POA: Diagnosis not present

## 2016-02-20 DIAGNOSIS — I25119 Atherosclerotic heart disease of native coronary artery with unspecified angina pectoris: Secondary | ICD-10-CM | POA: Diagnosis not present

## 2016-02-20 DIAGNOSIS — Z8249 Family history of ischemic heart disease and other diseases of the circulatory system: Secondary | ICD-10-CM | POA: Insufficient documentation

## 2016-02-20 DIAGNOSIS — F329 Major depressive disorder, single episode, unspecified: Secondary | ICD-10-CM | POA: Insufficient documentation

## 2016-02-20 DIAGNOSIS — Z87891 Personal history of nicotine dependence: Secondary | ICD-10-CM | POA: Insufficient documentation

## 2016-02-20 DIAGNOSIS — M549 Dorsalgia, unspecified: Secondary | ICD-10-CM | POA: Diagnosis not present

## 2016-02-20 DIAGNOSIS — M1990 Unspecified osteoarthritis, unspecified site: Secondary | ICD-10-CM | POA: Diagnosis not present

## 2016-02-20 DIAGNOSIS — Z7982 Long term (current) use of aspirin: Secondary | ICD-10-CM | POA: Insufficient documentation

## 2016-02-20 DIAGNOSIS — Z833 Family history of diabetes mellitus: Secondary | ICD-10-CM | POA: Insufficient documentation

## 2016-02-20 DIAGNOSIS — R0609 Other forms of dyspnea: Secondary | ICD-10-CM | POA: Insufficient documentation

## 2016-02-20 DIAGNOSIS — Z801 Family history of malignant neoplasm of trachea, bronchus and lung: Secondary | ICD-10-CM | POA: Diagnosis not present

## 2016-02-20 DIAGNOSIS — F419 Anxiety disorder, unspecified: Secondary | ICD-10-CM | POA: Diagnosis not present

## 2016-02-20 DIAGNOSIS — I209 Angina pectoris, unspecified: Secondary | ICD-10-CM | POA: Diagnosis present

## 2016-02-20 DIAGNOSIS — R42 Dizziness and giddiness: Secondary | ICD-10-CM | POA: Diagnosis not present

## 2016-02-20 DIAGNOSIS — Z880 Allergy status to penicillin: Secondary | ICD-10-CM | POA: Diagnosis not present

## 2016-02-20 DIAGNOSIS — K219 Gastro-esophageal reflux disease without esophagitis: Secondary | ICD-10-CM | POA: Diagnosis not present

## 2016-02-20 DIAGNOSIS — R9439 Abnormal result of other cardiovascular function study: Secondary | ICD-10-CM | POA: Diagnosis present

## 2016-02-20 DIAGNOSIS — R931 Abnormal findings on diagnostic imaging of heart and coronary circulation: Secondary | ICD-10-CM

## 2016-02-20 DIAGNOSIS — I119 Hypertensive heart disease without heart failure: Secondary | ICD-10-CM | POA: Diagnosis not present

## 2016-02-20 DIAGNOSIS — G8929 Other chronic pain: Secondary | ICD-10-CM | POA: Diagnosis not present

## 2016-02-20 DIAGNOSIS — K5900 Constipation, unspecified: Secondary | ICD-10-CM | POA: Insufficient documentation

## 2016-02-20 DIAGNOSIS — D649 Anemia, unspecified: Secondary | ICD-10-CM | POA: Diagnosis not present

## 2016-02-20 DIAGNOSIS — G43909 Migraine, unspecified, not intractable, without status migrainosus: Secondary | ICD-10-CM | POA: Diagnosis not present

## 2016-02-20 DIAGNOSIS — I519 Heart disease, unspecified: Secondary | ICD-10-CM | POA: Diagnosis present

## 2016-02-20 HISTORY — PX: CARDIAC CATHETERIZATION: SHX172

## 2016-02-20 SURGERY — LEFT HEART CATH AND CORONARY ANGIOGRAPHY
Anesthesia: LOCAL

## 2016-02-20 MED ORDER — SODIUM CHLORIDE 0.9% FLUSH: 3.0000 mL | INTRAVENOUS | Status: DC | PRN

## 2016-02-20 MED ORDER — SODIUM CHLORIDE 0.9 % WEIGHT BASED INFUSION: 1.0000 mL/kg/h | INTRAVENOUS | Status: DC

## 2016-02-20 MED ORDER — SODIUM CHLORIDE 0.9 % IV SOLN: 250.0000 mL | INTRAVENOUS | Status: DC | PRN

## 2016-02-20 MED ORDER — FENTANYL CITRATE (PF) 100 MCG/2ML IJ SOLN
INTRAMUSCULAR | Status: AC
  Filled 2016-02-20: qty 2

## 2016-02-20 MED ORDER — LIDOCAINE HCL (PF) 1 % IJ SOLN
INTRAMUSCULAR | Status: AC
  Filled 2016-02-20: qty 30

## 2016-02-20 MED ORDER — ASPIRIN EC 81 MG PO TBEC
81.0000 mg | DELAYED_RELEASE_TABLET | ORAL | Status: DC
Start: 2016-02-21 — End: 2016-02-21
  Administered 2016-02-21: 81 mg via ORAL
  Filled 2016-02-20: qty 1

## 2016-02-20 MED ORDER — LORATADINE 10 MG PO TABS
5.0000 mg | ORAL_TABLET | Freq: Every evening | ORAL | Status: DC
  Administered 2016-02-20: 5 mg via ORAL
  Filled 2016-02-20: qty 1

## 2016-02-20 MED ORDER — MORPHINE SULFATE (PF) 2 MG/ML IV SOLN
2.0000 mg | INTRAVENOUS | Status: DC | PRN
  Administered 2016-02-20 – 2016-02-21 (×5): 2 mg via INTRAVENOUS
  Filled 2016-02-20 (×5): qty 1

## 2016-02-20 MED ORDER — MIDAZOLAM HCL 2 MG/2ML IJ SOLN
INTRAMUSCULAR | Status: AC
  Filled 2016-02-20: qty 2

## 2016-02-20 MED ORDER — FENTANYL CITRATE (PF) 100 MCG/2ML IJ SOLN
INTRAMUSCULAR | Status: DC | PRN
  Administered 2016-02-20 (×3): 25 ug via INTRAVENOUS

## 2016-02-20 MED ORDER — SODIUM CHLORIDE 0.9% FLUSH
3.0000 mL | Freq: Two times a day (BID) | INTRAVENOUS | Status: DC
  Administered 2016-02-20 – 2016-02-21 (×2): 3 mL via INTRAVENOUS

## 2016-02-20 MED ORDER — LIDOCAINE HCL (PF) 1 % IJ SOLN
INTRAMUSCULAR | Status: DC | PRN
  Administered 2016-02-20: 2 mL

## 2016-02-20 MED ORDER — CLONAZEPAM 0.5 MG PO TABS
1.0000 mg | ORAL_TABLET | Freq: Every day | ORAL | Status: DC | PRN
  Administered 2016-02-20: 1 mg via ORAL
  Filled 2016-02-20: qty 2

## 2016-02-20 MED ORDER — BUSPIRONE HCL 5 MG PO TABS
5.0000 mg | ORAL_TABLET | Freq: Three times a day (TID) | ORAL | Status: DC
  Administered 2016-02-20: 5 mg via ORAL
  Filled 2016-02-20 (×4): qty 1

## 2016-02-20 MED ORDER — HEPARIN SODIUM (PORCINE) 1000 UNIT/ML IJ SOLN
INTRAMUSCULAR | Status: AC
  Filled 2016-02-20: qty 1

## 2016-02-20 MED ORDER — TRAZODONE HCL 100 MG PO TABS
200.0000 mg | ORAL_TABLET | Freq: Every day | ORAL | Status: DC
  Administered 2016-02-20: 200 mg via ORAL
  Filled 2016-02-20: qty 2

## 2016-02-20 MED ORDER — ACETAMINOPHEN 325 MG PO TABS: 650.0000 mg | ORAL_TABLET | ORAL | Status: DC | PRN

## 2016-02-20 MED ORDER — SODIUM CHLORIDE 0.9 % WEIGHT BASED INFUSION
1.0000 mL/kg/h | INTRAVENOUS | Status: AC
  Administered 2016-02-20: 1 mL/kg/h via INTRAVENOUS

## 2016-02-20 MED ORDER — FLUOXETINE HCL 20 MG PO CAPS
60.0000 mg | ORAL_CAPSULE | Freq: Every day | ORAL | Status: DC
  Administered 2016-02-20: 60 mg via ORAL
  Filled 2016-02-20: qty 3

## 2016-02-20 MED ORDER — SODIUM CHLORIDE 0.9 % WEIGHT BASED INFUSION
3.0000 mL/kg/h | INTRAVENOUS | Status: DC
  Administered 2016-02-20: 3 mL/kg/h via INTRAVENOUS

## 2016-02-20 MED ORDER — SODIUM CHLORIDE 0.9% FLUSH: 3.0000 mL | Freq: Two times a day (BID) | INTRAVENOUS | Status: DC

## 2016-02-20 MED ORDER — HEPARIN (PORCINE) IN NACL 2-0.9 UNIT/ML-% IJ SOLN
INTRAMUSCULAR | Status: AC
  Filled 2016-02-20: qty 1000

## 2016-02-20 MED ORDER — MIDAZOLAM HCL 2 MG/2ML IJ SOLN
INTRAMUSCULAR | Status: DC | PRN
  Administered 2016-02-20 (×3): 1 mg via INTRAVENOUS

## 2016-02-20 MED ORDER — HEPARIN SODIUM (PORCINE) 1000 UNIT/ML IJ SOLN
INTRAMUSCULAR | Status: DC | PRN
  Administered 2016-02-20: 3000 [IU] via INTRAVENOUS

## 2016-02-20 MED ORDER — OXYCODONE-ACETAMINOPHEN 5-325 MG PO TABS
1.0000 | ORAL_TABLET | ORAL | Status: DC | PRN
  Administered 2016-02-20 – 2016-02-21 (×6): 1 via ORAL
  Filled 2016-02-20 (×6): qty 1

## 2016-02-20 MED ORDER — ASPIRIN 81 MG PO CHEW: 81.0000 mg | CHEWABLE_TABLET | ORAL | Status: DC

## 2016-02-20 MED ORDER — ELETRIPTAN HYDROBROMIDE 40 MG PO TABS
40.0000 mg | ORAL_TABLET | ORAL | Status: DC | PRN
  Administered 2016-02-20: 40 mg via ORAL
  Filled 2016-02-20 (×3): qty 1

## 2016-02-20 MED ORDER — LORATADINE 10 MG PO TABS: 5.0000 mg | ORAL_TABLET | Freq: Every evening | ORAL | Status: DC

## 2016-02-20 MED ORDER — OXYCODONE-ACETAMINOPHEN 10-325 MG PO TABS: 1.0000 | ORAL_TABLET | Freq: Four times a day (QID) | ORAL | Status: DC | PRN

## 2016-02-20 MED ORDER — ASPIRIN 81 MG PO CHEW
CHEWABLE_TABLET | ORAL | Status: AC
  Administered 2016-02-20: 81 mg
  Filled 2016-02-20: qty 1

## 2016-02-20 MED ORDER — HEPARIN (PORCINE) IN NACL 2-0.9 UNIT/ML-% IJ SOLN
INTRAMUSCULAR | Status: DC | PRN
  Administered 2016-02-20: 1000 mL

## 2016-02-20 MED ORDER — VERAPAMIL HCL 2.5 MG/ML IV SOLN
INTRAVENOUS | Status: AC
  Filled 2016-02-20: qty 2

## 2016-02-20 MED ORDER — OXYCODONE HCL 5 MG PO TABS
5.0000 mg | ORAL_TABLET | ORAL | Status: DC | PRN
  Administered 2016-02-20 – 2016-02-21 (×6): 5 mg via ORAL
  Filled 2016-02-20 (×6): qty 1

## 2016-02-20 MED ORDER — IOPAMIDOL (ISOVUE-370) INJECTION 76%
INTRAVENOUS | Status: AC
  Filled 2016-02-20: qty 100

## 2016-02-20 MED ORDER — IOPAMIDOL (ISOVUE-370) INJECTION 76%
INTRAVENOUS | Status: DC | PRN
  Administered 2016-02-20: 70 mL via INTRAVENOUS

## 2016-02-20 MED ORDER — FLUTICASONE PROPIONATE 50 MCG/ACT NA SUSP
1.0000 | Freq: Every day | NASAL | Status: DC
  Administered 2016-02-20 – 2016-02-21 (×2): 1 via NASAL
  Filled 2016-02-20: qty 16

## 2016-02-20 MED ORDER — ONDANSETRON HCL 4 MG/2ML IJ SOLN: 4.0000 mg | Freq: Four times a day (QID) | INTRAMUSCULAR | Status: DC | PRN

## 2016-02-20 MED ORDER — SODIUM CHLORIDE 0.9% FLUSH
3.0000 mL | Freq: Two times a day (BID) | INTRAVENOUS | Status: DC
  Administered 2016-02-20 – 2016-02-21 (×3): 3 mL via INTRAVENOUS

## 2016-02-20 SURGICAL SUPPLY — 12 items
CATH INFINITI 5 FR JL3.5 (CATHETERS) ×2 IMPLANT
CATH INFINITI 5FR ANG PIGTAIL (CATHETERS) ×2 IMPLANT
CATH INFINITI JR4 5F (CATHETERS) ×2 IMPLANT
DEVICE RAD COMP TR BAND LRG (VASCULAR PRODUCTS) ×2 IMPLANT
GLIDESHEATH SLEND SS 6F .021 (SHEATH) ×2 IMPLANT
KIT HEART LEFT (KITS) ×2 IMPLANT
PACK CARDIAC CATHETERIZATION (CUSTOM PROCEDURE TRAY) ×2 IMPLANT
SYR MEDRAD MARK V 150ML (SYRINGE) ×2 IMPLANT
TRANSDUCER W/STOPCOCK (MISCELLANEOUS) ×2 IMPLANT
TUBING CIL FLEX 10 FLL-RA (TUBING) ×2 IMPLANT
WIRE HI TORQ VERSACORE-J 145CM (WIRE) ×2 IMPLANT
WIRE SAFE-T 1.5MM-J .035X260CM (WIRE) ×2 IMPLANT

## 2016-02-20 NOTE — Interval H&P Note (Signed)
Cath Lab Visit (complete for each Cath Lab visit)  Clinical Evaluation Leading to the Procedure:   ACS: Yes.    Non-ACS:    Anginal Classification: CCS III  Anti-ischemic medical therapy: Minimal Therapy (1 class of medications)  Non-Invasive Test Results: Intermediate-risk stress test findings: cardiac mortality 1-3%/year  Prior CABG: No previous CABG      History and Physical Interval Note:  02/20/2016 8:54 AM  Dorothy White  has presented today for surgery, with the diagnosis of cp, abnormal stress test, dizziness  The various methods of treatment have been discussed with the patient and family. After consideration of risks, benefits and other options for treatment, the patient has consented to  Procedure(s): Left Heart Cath and Coronary Angiography (N/A) as a surgical intervention .  The patient's history has been reviewed, patient examined, no change in status, stable for surgery.  I have reviewed the patient's chart and labs.  Questions were answered to the patient's satisfaction.     Dorothy White

## 2016-02-20 NOTE — H&P (View-Only) (Signed)
Cardiology Office Note    Date:  02/15/2016   ID:  Dorothy White, DOB 06-10-1951, MRN [id number]  PCP:  Jonathon Bellows, MD  Cardiologist:   Ena Dawley, MD  Referring physician: Jonathon Bellows, MD   Chief complain: Chest pain, dyspnea on exertion, dizziness.  History of Present Illness:  Dorothy White is a 65 y.o. female  with medical history significant of depression and migraine Presented to the emergency room on 12/21/2015 with falling on her side while walking slurred speech and blurry vision. This was during an episode of migraine headache. No neurologic deficit on presentation normal brain MRI/MRA, normal MRI of the spine. The symptoms have resolved with treatment of migraine and her gait dysfunction has resolved. It was concluded by neurology that this was a presentation of complicated migraine - ataxia. The patient states that she has noticed recently that she is more short of breath on exertion especially when walking stairs or uphill. She doesn't exercise but is retired and is able to do independently goal activities of daily living. She has occasional palpitations that are not related to exertion and they will last approximately a minute. She has no associated dizziness or syncope with palpitations. Her father died in his 71s possibly to myocardial infarction. Her mom died of lung cancer in her brother is currently dealing with lung cancer as well. She has never smoked. Long time ago she was told that she has mitral valve prolapse and arrhythmia but doesn't remember what type. While in the hospital she was found to have LVEF of 40-45% with inferior hypokinesis and was referred to [place] for further evaluation.  At the last visit we schedule a pharmacologic nuclear stress test That showed less than 1 mm horizontal ST segment depressions with T-wave inversions in inferolateral leads during Lexiscan infusion, and medium defect of moderate severity present in the basal inferoseptal  mid interseptal apical anterior location however it but the defect was described as nonreversible and no ischemia noted.  The patient was given prescription for lisinopril for her LV dysfunction and hypertension as well as Crestor for finding of old infarction consistent with a history of coronary artery disease. The patient called yesterday and states that she feels profoundly short of breath and has pressure behind her sternum when she does minimal activities and ever since she started to take his medication she feels really tired and nauseous all the time. She also feels dizzy when she stands up.  Past Medical History  Diagnosis Date  . Arrhythmia   . Diverticulitis   . Depression   . Anxiety   . HLD (hyperlipidemia)   . Chronic back pain   . Stroke [facility])     "mini" stroke - 06/2013  . Headache(784.0)     Migraines  . GERD (gastroesophageal reflux disease)   . Arthritis   . Cancer ([place])     basal cell skin cancer  . Anemia   . Constipation     Past Surgical History  Procedure Laterality Date  . Tonsillectomy    . Abdominal hysterectomy    . Breast enhancement surgery    . Rhinoplasty    . Oophorectomy Bilateral   . Bunionectomy Bilateral   . Colonoscopy    . Orif humerus fracture Right 02/15/2015    Procedure: OPEN REDUCTION INTERNAL FIXATION (ORIF) PROXIMAL HUMERUS FRACTURE;  Surgeon: Netta Cedars, MD;  Location: [place];  Service: Orthopedics;  Laterality: Right;   Current Medications: Outpatient Prescriptions Prior to  Visit  Medication Sig Dispense Refill  . Ascorbic Acid (VITAMIN C PO) Take 1 tablet by mouth every evening.    Marland Kitchen aspirin EC 81 MG tablet Take 81 mg by mouth every other day.    . calcium-vitamin D (OSCAL WITH D) 500-200 MG-UNIT per tablet Take 1 tablet by mouth daily with breakfast.    . Cholecalciferol (VITAMIN D PO) Take 1 capsule by mouth every evening.    Marland Kitchen FLUoxetine (PROZAC) 20 MG capsule Take 60 mg by mouth daily.     Marland Kitchen levocetirizine (XYZAL  ALLERGY 24HR) 5 MG tablet Take 5 mg by mouth every evening.    . LUTEIN PO Take 1 capsule by mouth every evening.    . Multiple Vitamins-Minerals (ONE-A-DAY WOMENS 50 PLUS PO) Take 1 tablet by mouth every evening.    Marland Kitchen RELPAX 40 MG tablet Take 1 tablet by mouth as needed for migraine and may take 1 additional tablet in 2 hours if no relief  11  . traZODone (DESYREL) 100 MG tablet Take 200 mg by mouth at bedtime.     . vitamin E 100 UNIT capsule Take 100 Units by mouth every evening.    . bismuth subsalicylate (PEPTO BISMOL) 262 MG chewable tablet Chew 524 mg by mouth as needed for indigestion.    Marland Kitchen buPROPion (WELLBUTRIN) 100 MG tablet Take 100 mg by mouth 2 (two) times daily.  3  . clonazePAM (KLONOPIN) 1 MG tablet Take 1 mg by mouth daily as needed (anxiety/sleep.).   3  . lisinopril (PRINIVIL,ZESTRIL) 5 MG tablet Take 1 tablet (5 mg total) by mouth daily. (Patient taking differently: Take 2.5 mg by mouth daily. ) 90 tablet 3  . mometasone (NASONEX) 50 MCG/ACT nasal spray Place 2 sprays into both nostrils daily.   11  . oxyCODONE-acetaminophen (PERCOCET) 10-325 MG tablet Take 1 tablet by mouth every 6 (six) hours.  0  . rosuvastatin (CRESTOR) 5 MG tablet Take 1 tablet (5 mg total) by mouth daily. (Patient taking differently: Take 2.5 mg by mouth at bedtime. ) 90 tablet 3   No facility-administered medications prior to visit.    Allergies:   Penicillins   Social History   Social History  . Marital Status: Divorced    Spouse Name: N/A  . Number of Children: 0  . Years of Education: 12   Occupational History  . retired     Retired  .     Social History Main Topics  . Smoking status: Former Smoker    Types: Cigarettes    Quit date: 06/30/2011  . Smokeless tobacco: Never Used  . Alcohol Use: No     Comment: denies   . Drug Use: No  . Sexual Activity: No   Other Topics Concern  . None   Social History Narrative   Patient lives at home alone.    Patient is retired.    Education- High school.   Right handed.   Patient drinks one cup of coffee and a big glass of ice tea.    Family History:  The patient's family history includes Diabetes in her paternal grandmother; Esophageal cancer in her brother; Heart attack in her father; Hypertension in her brother; Lung cancer in her mother. There is no history of Colon cancer, Kidney disease, or Liver disease.   ROS:   Please see the history of present illness.    ROS All other systems reviewed and are negative.  PHYSICAL EXAM:   VS:  BP 142/85 mmHg  Pulse 75  Ht 5\' 5"  (1.651 m)  Wt 127 lb (57.607 kg)  BMI 21.13 kg/m2   GEN: Well nourished, well developed, in no acute distress HEENT: normal Neck: no JVD, carotid bruits, or masses Cardiac: RRR; no murmurs, rubs, or gallops,no edema  Respiratory:  clear to auscultation bilaterally, normal work of breathing GI: soft, nontender, nondistended, + BS MS: no deformity or atrophy Skin: warm and dry, no rash Neuro:  Alert and Oriented x 3, Strength and sensation are intact Psych: euthymic mood, full affect  Wt Readings from Last 3 Encounters:  02/15/16 127 lb (57.607 kg)  01/23/16 130 lb (58.968 kg)  01/03/16 128 lb (58.06 kg)      Studies/Labs Reviewed:   EKG:   From 12/17/2015 shows normal sinus rhythm nonspecific ST-T wave abnormalities but otherwise normal EKG.  Recent Labs: 01/23/2016: Hemoglobin 14.2; Platelets 291 02/06/2016: ALT 16; BUN 21; Creat 0.66; Potassium 4.0; Sodium 140   Lipid Panel    Component Value Date/Time   CHOL 156 12/18/2015 0510   CHOL 243* 08/15/2013 0839   TRIG 100 12/18/2015 0510   HDL 47 12/18/2015 0510   HDL 55 08/15/2013 0839   CHOLHDL 3.3 12/18/2015 0510   VLDL 20 12/18/2015 0510   LDLCALC 89 12/18/2015 0510   LDLCALC 142* 08/15/2013 0839   TTE: 12/18/2015 - Left ventricle: The cavity size was normal. Systolic function was  mildly to moderately reduced. The estimated ejection fraction was  in the range of 40% to  45%. Moderate hypokinesis of the  basalinferior myocardium. - Pulmonary arteries: Systolic pressure was mildly to moderately  increased. PA peak pressure: 40 mm Hg (S).  Carotid US: - The vertebral arteries appear patent with antegrade flow. - Findings consistent with 1- 39 percent stenosis involving the  right internal carotid artery and the left internal carotid  artery.    ASSESSMENT:    1. Chest pain, unspecified chest pain type   2. DOE (dyspnea on exertion)   3. Dizziness   4. Abnormal stress test   5. Hypertensive heart disease without CHF   6. Hyperlipidemia      PLAN:  In order of problems listed above:  This is a 65 year old patient with significant family history of premature coronary artery disease in her father who is coming with symptoms of worsening knee on exertion her baseline EKG shows ST depressions and negative T waves in the anterolateral leads suspicious for ischemia this is different from EKG 2 years ago. She also had worsening ST depression during Lexiscan infusion. I have reviewed her nuclear imaging from her stress test and addition to prior infarct in the inferior and inferoseptal walls but there is also minimal ischemia in the basal and mid inferolateral wall.  He is also order static daily with her lying blood pressure 142/85, sitting 120/68, standing 90/60. I will discontinue lisinopril but also Crestor since I don't know which one of dose is causing additional nausea.  I would schedule this patient for a left cardiac catheterization to rule out significant obstructive coronary artery disease and also to determine future management with statin versus PCSK-( is significant plaque and intolerance to statin.  We will recheck her blood pressure in the hospital and if still elevated, try to introduce another blood pressure lowering agent at that time.  Medication Adjustments/Labs and Tests Ordered: Current medicines are reviewed at length with the  patient today.  Concerns regarding medicines are outlined above.  Medication changes, Labs and Tests ordered today are  listed in the Patient Instructions below. Patient Instructions  Medication Instructions:  1. STOP LISINOPRIL 2. STOP CRESTOR 3. YOU HAVE BEEN GIVEN AN RX FOR KLONOPIN  Labwork: 1. TODAY BMET, CBC W/DIFF, PT/INR  Testing/Procedures: Your physician has requested that you have a cardiac catheterization. Cardiac catheterization is used to diagnose and/or treat various heart conditions. Doctors may recommend this procedure for a number of different reasons. The most common reason is to evaluate chest pain. Chest pain can be a symptom of coronary artery disease (CAD), and cardiac catheterization can show whether plaque is narrowing or blocking your heart's arteries. This procedure is also used to evaluate the valves, as well as measure the blood flow and oxygen levels in different parts of your heart. For further information please visit [facility]. Please follow instruction sheet, as given.   Follow-Up: 2-3 WEEK FOLLOW S/P CATH WITH DR. [name] OR PA/NP   Any Other Special Instructions Will Be Listed Below (If Applicable).     If you need a refill on your cardiac medications before your next appointment, please call your pharmacy.       Signed, Ena Dawley, MD  02/15/2016 3:49 PM    [address], [place], [place]  [address] Phone: [phone]; Fax: [phone]

## 2016-02-21 ENCOUNTER — Encounter (HOSPITAL_COMMUNITY): Payer: Self-pay | Admitting: Interventional Cardiology

## 2016-02-21 DIAGNOSIS — I209 Angina pectoris, unspecified: Secondary | ICD-10-CM | POA: Diagnosis not present

## 2016-02-21 DIAGNOSIS — R9439 Abnormal result of other cardiovascular function study: Secondary | ICD-10-CM | POA: Diagnosis not present

## 2016-02-21 NOTE — Discharge Summary (Signed)
Discharge Summary    Patient ID: Dorothy White,  MRN: [id number], DOB/AGE: September 26, 1950 65 y.o.  Admit date: 02/20/2016 Discharge date: 02/21/2016  Primary Care Provider: Maurice Small D Primary Cardiologist: Dr. [name]  Discharge Diagnoses    Principal Problem:   Abnormal nuclear stress test Active Problems:   Hyperlipidemia   LV dysfunction   Angina, class IV (HCC) - chest tightness and pressure with dyspnea with minimal exertion   Allergies: PCN    Diagnostic Studies/Procedures  Left Heart Cath and Coronary Angiography 02/20/16  Mid LAD lesion, 25 %stenosed.  The left ventricular systolic function is normal.  LV end diastolic pressure is normal.  The left ventricular ejection fraction is 55-65% by visual estimate.  There is no aortic valve stenosis.   Continue preventive therapy. _____________   History of Present Illness   Dorothy White is a 65 year old female with a past medical history of depression, TIA (2014), HLD and migraine headaches. No prior cardiac history. She was seen in the office by Dr. [name] and reported increased SOB with walking up stairs or walking up hills. She was sent for a nuclear stress test outpatient on 01/03/16, which showed a medium defect of moderate severity present in the basal inferoseptal, mid inferoseptal and apical anterior location. It was a low risk study. She was started on lisinopril and low dose statin.   She was seen in the ED on 01/23/16 as she was having some intermittent sharp chest pain. EKG was unremarkable and cardiac enzymes were negative. She was sent home. She followed up in the office with Dr. [name] on 02/15/16, where she was noted to be orthostatic and had some dizziness. Both the lisinopril and the Crestor were discontinued per Dr. [name]. During that visit she reported profound SOB and substernal chest pressure. She was scheduled for heart cath on 02/20/16.   Hospital Course  Dorothy White underwent left heart cath on  02/20/16. Report above. There was no obstructive CAD. Her EF was 55-65% by cath. Her right radial site is stable with no hematoma.   She will continue her daily ASA. No statin as she feels that it caused her some nausea and SOB. Her LDL is 89.   She was seen today by Dr. Ron Parker and deemed suitable for discharge. We will follow up with her in 3-4 weeks.  _____________  Discharge Vitals Blood pressure 106/68, pulse 64, temperature 97.6 F (36.4 C), temperature source Oral, resp. rate 16, height 5\' 5"  (1.651 m), weight 126 lb 3.2 oz (57.2 kg), SpO2 99 %.  Filed Weights   02/20/16 0813 02/20/16 1629 02/21/16 0501  Weight: 127 lb (57.6 kg) 128 lb (58.1 kg) 126 lb 3.2 oz (57.2 kg)    Labs & Radiologic Studies     Dg Chest 2 View  Result Date: 01/23/2016 CLINICAL DATA:  Chest pain and shortness of breath EXAM: CHEST  2 VIEW COMPARISON:  Dec 18, 2015 FINDINGS: Bilateral breast implants with capsular calcifications. The heart, hila, mediastinum, lungs, and pleura are unremarkable with no acute abnormalities. IMPRESSION: No active cardiopulmonary disease. Electronically Signed   By: Dorise Bullion III M.D   On: 01/23/2016 21:20    Disposition   Pt is being discharged home today in good condition.  Follow-up Plans & Appointments   Our office will call to schedule an appt. Follow-up Information    Ena Dawley, MD .   Specialty:  Cardiology Why:  The office will call you to schedule a  follow up appt.  Contact information: [address] [place] [id number] [phone]          Discharge Instructions    Diet - low sodium heart healthy    Complete by:  As directed   Discharge instructions    Complete by:  As directed   Radial Site Care Refer to this sheet in the next few weeks. These instructions provide you with information on caring for yourself after your procedure. Your caregiver may also give you more specific instructions. Your treatment has been planned  according to current medical practices, but problems sometimes occur. Call your caregiver if you have any problems or questions after your procedure. HOME CARE INSTRUCTIONS You may shower the day after the procedure.Remove the bandage (dressing) and gently wash the site with plain soap and water.Gently pat the site dry.  Do not apply powder or lotion to the site.  Do not submerge the affected site in water for 3 to 5 days.  Inspect the site at least twice daily.  Do not flex or bend the affected arm for 24 hours.  No lifting over 5 pounds (2.3 kg) for 5 days after your procedure.  Do not drive home if you are discharged the same day of the procedure. Have someone else drive you.  You may drive 24 hours after the procedure unless otherwise instructed by your caregiver.  What to expect: Any bruising will usually fade within 1 to 2 weeks.  Blood that collects in the tissue (hematoma) may be painful to the touch. It should usually decrease in size and tenderness within 1 to 2 weeks.  SEEK IMMEDIATE MEDICAL CARE IF: You have unusual pain at the radial site.  You have redness, warmth, swelling, or pain at the radial site.  You have drainage (other than a small amount of blood on the dressing).  You have chills.  You have a fever or persistent symptoms for more than 72 hours.  You have a fever and your symptoms suddenly get worse.  Your arm becomes pale, cool, tingly, or numb.  You have heavy bleeding from the site. Hold pressure on the site.   Increase activity slowly    Complete by:  As directed      Discharge Medications   Current Discharge Medication List    CONTINUE these medications which have NOT CHANGED   Details  Ascorbic Acid (VITAMIN C PO) Take 1 tablet by mouth every evening.    aspirin EC 81 MG tablet Take 81 mg by mouth every other day.    calcium-vitamin D (OSCAL WITH D) 500-200 MG-UNIT per tablet Take 1 tablet by mouth daily with breakfast.    Cholecalciferol (VITAMIN  D PO) Take 1 capsule by mouth every evening.    clonazePAM (KLONOPIN) 1 MG tablet Take 1 tablet (1 mg total) by mouth daily as needed (anxiety/sleep.). Qty: 10 tablet, Refills: 0    FLUoxetine (PROZAC) 20 MG capsule Take 60 mg by mouth daily.     levocetirizine (XYZAL ALLERGY 24HR) 5 MG tablet Take 5 mg by mouth every evening.    LUTEIN PO Take 1 capsule by mouth every evening.    mometasone (NASONEX) 50 MCG/ACT nasal spray Place 2 sprays into the nose daily.    Multiple Vitamins-Minerals (ONE-A-DAY WOMENS 50 PLUS PO) Take 1 tablet by mouth every evening.    oxyCODONE-acetaminophen (PERCOCET) 10-325 MG tablet Take 1 tablet by mouth 4 (four) times daily as needed. Refills: 0  RELPAX 40 MG tablet Take 1 tablet by mouth as needed for migraine and may take 1 additional tablet in 2 hours if no relief Refills: 11    traZODone (DESYREL) 100 MG tablet Take 200 mg by mouth at bedtime.     vitamin E 100 UNIT capsule Take 100 Units by mouth every evening.    busPIRone (BUSPAR) 5 MG tablet Take 1 tablet by mouth 3 (three) times daily. Refills: 0          Outstanding Labs/Studies    Duration of Discharge Encounter   Greater than 30 minutes including physician time.  Signed, Arbutus Leas NP 02/21/2016, 7:58 AM  Patient seen and examined. I agree with the assessment and plan as detailed above. See also my additional thoughts below.   The patient is stable today. Fortunately her cath showed only minimal coronary disease. I made the decision for discharge. I agree with the note as outlined above in the discharge plans.  Dola Argyle, MD, [facility] 02/21/2016 8:09 AM

## 2016-02-21 NOTE — Progress Notes (Signed)
Discharge teaching and instructions reviewed. VSS. Pt discharging home via brother.

## 2016-02-29 ENCOUNTER — Ambulatory Visit: Payer: BLUE CROSS/BLUE SHIELD | Admitting: Neurology

## 2016-02-29 DIAGNOSIS — Z029 Encounter for administrative examinations, unspecified: Secondary | ICD-10-CM

## 2016-03-03 ENCOUNTER — Encounter: Payer: Self-pay | Admitting: *Deleted

## 2016-03-11 ENCOUNTER — Telehealth: Payer: Self-pay | Admitting: Internal Medicine

## 2016-03-11 NOTE — Telephone Encounter (Signed)
Pt states she started having LLQ abdominal pain last Wednesday. Reports that the pain is intense at times and doubles her over. Pt reports she has been running a low grade temp of 99. Pt requesting antibiotics, thinks she is having a diverticulitis flare. Please advise.

## 2016-03-11 NOTE — Telephone Encounter (Signed)
Spoke with pt and let her know Dr. Blanch Media recommendation. Pt now states that she has been on the 2 antibiotics since last week, they were given by her PCP but she is not better.  Pt apologized for not providing that information, states she forgot. Pt states she called her PCP back but they cannot see her this week. Pt instructed to start clear liquid diet and to continue her antibiotics. Pt scheduled to see Dorothy Bogus PA 03/13/16@3pm . Pt aware of appt.

## 2016-03-11 NOTE — Telephone Encounter (Signed)
She has not been seen since 2015. Prescribe Cipro 500 mg twice a day and metronidazole 500 mg twice a day. Each for 1 week. Liquid diet. Have her see APP this week

## 2016-03-12 ENCOUNTER — Encounter (HOSPITAL_COMMUNITY): Payer: Self-pay | Admitting: *Deleted

## 2016-03-12 ENCOUNTER — Emergency Department (HOSPITAL_COMMUNITY): Payer: BLUE CROSS/BLUE SHIELD

## 2016-03-12 ENCOUNTER — Inpatient Hospital Stay (HOSPITAL_COMMUNITY)
Admission: EM | Admit: 2016-03-12 | Discharge: 2016-03-19 | DRG: 392 | Disposition: A | Discharge status: Home Health | Payer: BLUE CROSS/BLUE SHIELD | Attending: Internal Medicine | Admitting: Internal Medicine

## 2016-03-12 DIAGNOSIS — G43909 Migraine, unspecified, not intractable, without status migrainosus: Secondary | ICD-10-CM | POA: Diagnosis present

## 2016-03-12 DIAGNOSIS — Z66 Do not resuscitate: Secondary | ICD-10-CM | POA: Diagnosis present

## 2016-03-12 DIAGNOSIS — F32A Depression, unspecified: Secondary | ICD-10-CM | POA: Diagnosis present

## 2016-03-12 DIAGNOSIS — K5792 Diverticulitis of intestine, part unspecified, without perforation or abscess without bleeding: Secondary | ICD-10-CM | POA: Diagnosis present

## 2016-03-12 DIAGNOSIS — G8929 Other chronic pain: Secondary | ICD-10-CM | POA: Diagnosis present

## 2016-03-12 DIAGNOSIS — Z800 Family history of malignant neoplasm of digestive organs: Secondary | ICD-10-CM

## 2016-03-12 DIAGNOSIS — K869 Disease of pancreas, unspecified: Secondary | ICD-10-CM

## 2016-03-12 DIAGNOSIS — Z7982 Long term (current) use of aspirin: Secondary | ICD-10-CM | POA: Diagnosis not present

## 2016-03-12 DIAGNOSIS — E876 Hypokalemia: Secondary | ICD-10-CM | POA: Diagnosis present

## 2016-03-12 DIAGNOSIS — Z1612 Extended spectrum beta lactamase (ESBL) resistance: Secondary | ICD-10-CM | POA: Diagnosis present

## 2016-03-12 DIAGNOSIS — Z880 Allergy status to penicillin: Secondary | ICD-10-CM

## 2016-03-12 DIAGNOSIS — R109 Unspecified abdominal pain: Secondary | ICD-10-CM | POA: Diagnosis present

## 2016-03-12 DIAGNOSIS — A498 Other bacterial infections of unspecified site: Secondary | ICD-10-CM | POA: Diagnosis not present

## 2016-03-12 DIAGNOSIS — I25119 Atherosclerotic heart disease of native coronary artery with unspecified angina pectoris: Secondary | ICD-10-CM | POA: Diagnosis present

## 2016-03-12 DIAGNOSIS — Z85828 Personal history of other malignant neoplasm of skin: Secondary | ICD-10-CM

## 2016-03-12 DIAGNOSIS — R519 Headache, unspecified: Secondary | ICD-10-CM | POA: Diagnosis present

## 2016-03-12 DIAGNOSIS — K5720 Diverticulitis of large intestine with perforation and abscess without bleeding: Secondary | ICD-10-CM | POA: Diagnosis present

## 2016-03-12 DIAGNOSIS — Z87891 Personal history of nicotine dependence: Secondary | ICD-10-CM | POA: Diagnosis not present

## 2016-03-12 DIAGNOSIS — K5780 Diverticulitis of intestine, part unspecified, with perforation and abscess without bleeding: Secondary | ICD-10-CM

## 2016-03-12 DIAGNOSIS — K219 Gastro-esophageal reflux disease without esophagitis: Secondary | ICD-10-CM | POA: Diagnosis present

## 2016-03-12 DIAGNOSIS — Z87898 Personal history of other specified conditions: Secondary | ICD-10-CM

## 2016-03-12 DIAGNOSIS — F419 Anxiety disorder, unspecified: Secondary | ICD-10-CM | POA: Diagnosis present

## 2016-03-12 DIAGNOSIS — G894 Chronic pain syndrome: Secondary | ICD-10-CM | POA: Diagnosis present

## 2016-03-12 DIAGNOSIS — E785 Hyperlipidemia, unspecified: Secondary | ICD-10-CM | POA: Diagnosis present

## 2016-03-12 DIAGNOSIS — Z7951 Long term (current) use of inhaled steroids: Secondary | ICD-10-CM

## 2016-03-12 DIAGNOSIS — K5732 Diverticulitis of large intestine without perforation or abscess without bleeding: Secondary | ICD-10-CM

## 2016-03-12 DIAGNOSIS — R52 Pain, unspecified: Secondary | ICD-10-CM

## 2016-03-12 DIAGNOSIS — F329 Major depressive disorder, single episode, unspecified: Secondary | ICD-10-CM | POA: Diagnosis present

## 2016-03-12 DIAGNOSIS — B9620 Unspecified Escherichia coli [E. coli] as the cause of diseases classified elsewhere: Secondary | ICD-10-CM | POA: Diagnosis present

## 2016-03-12 DIAGNOSIS — F418 Other specified anxiety disorders: Secondary | ICD-10-CM | POA: Diagnosis not present

## 2016-03-12 DIAGNOSIS — K8689 Other specified diseases of pancreas: Secondary | ICD-10-CM

## 2016-03-12 DIAGNOSIS — Z8673 Personal history of transient ischemic attack (TIA), and cerebral infarction without residual deficits: Secondary | ICD-10-CM | POA: Diagnosis not present

## 2016-03-12 LAB — URINE MICROSCOPIC-ADD ON

## 2016-03-12 LAB — COMPREHENSIVE METABOLIC PANEL
ALBUMIN: 3.1 g/dL — AB (ref 3.5–5.0)
ALT: 13 U/L — ABNORMAL LOW (ref 14–54)
ANION GAP: 8 (ref 5–15)
AST: 11 U/L — ABNORMAL LOW (ref 15–41)
Alkaline Phosphatase: 77 U/L (ref 38–126)
BILIRUBIN TOTAL: 0.7 mg/dL (ref 0.3–1.2)
BUN: 7 mg/dL (ref 6–20)
CO2: 26 mmol/L (ref 22–32)
Calcium: 8.9 mg/dL (ref 8.9–10.3)
Chloride: 105 mmol/L (ref 101–111)
Creatinine, Ser: 0.71 mg/dL (ref 0.44–1.00)
GFR calc Af Amer: >60 mL/min (ref >60)
GFR calc non Af Amer: >60 mL/min (ref >60)
GLUCOSE: 104 mg/dL — AB (ref 65–99)
POTASSIUM: 3.3 mmol/L — AB (ref 3.5–5.1)
SODIUM: 139 mmol/L (ref 135–145)
TOTAL PROTEIN: 6.2 g/dL — AB (ref 6.5–8.1)

## 2016-03-12 LAB — URINALYSIS, ROUTINE W REFLEX MICROSCOPIC
Glucose, UA: NEGATIVE mg/dL
Hgb urine dipstick: NEGATIVE
Ketones, ur: NEGATIVE mg/dL
LEUKOCYTES UA: NEGATIVE
NITRITE: NEGATIVE
PH: 6 (ref 5.0–8.0)
Protein, ur: 30 mg/dL — AB
SPECIFIC GRAVITY, URINE: 1.026 (ref 1.005–1.030)

## 2016-03-12 LAB — CBC
HEMATOCRIT: 38 % (ref 36.0–46.0)
HEMOGLOBIN: 12 g/dL (ref 12.0–15.0)
MCH: 28.7 pg (ref 26.0–34.0)
MCHC: 31.6 g/dL (ref 30.0–36.0)
MCV: 90.9 fL (ref 78.0–100.0)
Platelets: 307 10*3/uL (ref 150–400)
RBC: 4.18 MIL/uL (ref 3.87–5.11)
RDW: 13.5 % (ref 11.5–15.5)
WBC: 10.2 10*3/uL (ref 4.0–10.5)

## 2016-03-12 LAB — LIPASE, BLOOD: Lipase: 16 U/L (ref 11–51)

## 2016-03-12 MED ORDER — MORPHINE SULFATE (PF) 2 MG/ML IV SOLN: 2.0000 mg | INTRAVENOUS | Status: DC | PRN

## 2016-03-12 MED ORDER — LEVOFLOXACIN IN D5W 750 MG/150ML IV SOLN
750.0000 mg | Freq: Once | INTRAVENOUS | Status: AC
  Administered 2016-03-12: 750 mg via INTRAVENOUS
  Filled 2016-03-12: qty 150

## 2016-03-12 MED ORDER — LEVOFLOXACIN IN D5W 750 MG/150ML IV SOLN
750.0000 mg | INTRAVENOUS | Status: DC
  Administered 2016-03-13 – 2016-03-14 (×2): 750 mg via INTRAVENOUS
  Filled 2016-03-12 (×2): qty 150

## 2016-03-12 MED ORDER — METRONIDAZOLE IN NACL 5-0.79 MG/ML-% IV SOLN
500.0000 mg | Freq: Once | INTRAVENOUS | Status: AC
  Administered 2016-03-12: 500 mg via INTRAVENOUS
  Filled 2016-03-12: qty 100

## 2016-03-12 MED ORDER — SODIUM CHLORIDE 0.9 % IV BOLUS (SEPSIS)
1000.0000 mL | Freq: Once | INTRAVENOUS | Status: AC
Start: 2016-03-12 — End: 2016-03-12
  Administered 2016-03-12: 1000 mL via INTRAVENOUS

## 2016-03-12 MED ORDER — HYDROMORPHONE HCL 1 MG/ML IJ SOLN
1.0000 mg | INTRAMUSCULAR | Status: DC | PRN
  Administered 2016-03-13 – 2016-03-16 (×16): 1 mg via INTRAVENOUS
  Filled 2016-03-12 (×16): qty 1

## 2016-03-12 MED ORDER — POTASSIUM CHLORIDE 10 MEQ/100ML IV SOLN
10.0000 meq | INTRAVENOUS | Status: AC
  Administered 2016-03-13 (×3): 10 meq via INTRAVENOUS
  Filled 2016-03-12 (×3): qty 100

## 2016-03-12 MED ORDER — MORPHINE SULFATE (PF) 4 MG/ML IV SOLN
4.0000 mg | Freq: Once | INTRAVENOUS | Status: AC
  Administered 2016-03-12: 4 mg via INTRAVENOUS
  Filled 2016-03-12: qty 1

## 2016-03-12 MED ORDER — LACTATED RINGERS IV SOLN
INTRAVENOUS | Status: DC
  Administered 2016-03-12 – 2016-03-17 (×8): via INTRAVENOUS

## 2016-03-12 MED ORDER — ACETAMINOPHEN 325 MG PO TABS
650.0000 mg | ORAL_TABLET | Freq: Four times a day (QID) | ORAL | Status: DC | PRN
  Administered 2016-03-13 – 2016-03-14 (×2): 650 mg via ORAL
  Filled 2016-03-12 (×3): qty 2

## 2016-03-12 MED ORDER — FLUOXETINE HCL 20 MG PO CAPS
60.0000 mg | ORAL_CAPSULE | Freq: Every day | ORAL | Status: DC
  Administered 2016-03-14 – 2016-03-19 (×6): 60 mg via ORAL
  Filled 2016-03-12 (×7): qty 3

## 2016-03-12 MED ORDER — ONDANSETRON HCL 4 MG PO TABS
4.0000 mg | ORAL_TABLET | Freq: Four times a day (QID) | ORAL | Status: DC | PRN
  Administered 2016-03-17: 4 mg via ORAL
  Filled 2016-03-12: qty 1

## 2016-03-12 MED ORDER — ACETAMINOPHEN 650 MG RE SUPP: 650.0000 mg | Freq: Four times a day (QID) | RECTAL | Status: DC | PRN

## 2016-03-12 MED ORDER — ASPIRIN EC 81 MG PO TBEC
81.0000 mg | DELAYED_RELEASE_TABLET | ORAL | Status: DC
  Administered 2016-03-15 – 2016-03-19 (×3): 81 mg via ORAL
  Filled 2016-03-12 (×5): qty 1

## 2016-03-12 MED ORDER — ONDANSETRON HCL 4 MG/2ML IJ SOLN
4.0000 mg | Freq: Once | INTRAMUSCULAR | Status: AC
  Administered 2016-03-12: 4 mg via INTRAVENOUS
  Filled 2016-03-12: qty 2

## 2016-03-12 MED ORDER — SODIUM CHLORIDE 0.9 % IV BOLUS (SEPSIS)
1000.0000 mL | Freq: Once | INTRAVENOUS | Status: AC
  Administered 2016-03-12: 1000 mL via INTRAVENOUS

## 2016-03-12 MED ORDER — BUSPIRONE HCL 5 MG PO TABS
5.0000 mg | ORAL_TABLET | Freq: Three times a day (TID) | ORAL | Status: DC
  Administered 2016-03-13 – 2016-03-19 (×4): 5 mg via ORAL
  Filled 2016-03-12 (×21): qty 1

## 2016-03-12 MED ORDER — ONDANSETRON HCL 4 MG/2ML IJ SOLN
4.0000 mg | Freq: Four times a day (QID) | INTRAMUSCULAR | Status: DC | PRN
  Administered 2016-03-13 – 2016-03-16 (×5): 4 mg via INTRAVENOUS
  Filled 2016-03-12 (×5): qty 2

## 2016-03-12 MED ORDER — IOPAMIDOL (ISOVUE-300) INJECTION 61%
INTRAVENOUS | Status: AC
  Administered 2016-03-12: 100 mL
  Filled 2016-03-12: qty 100

## 2016-03-12 MED ORDER — TRAZODONE HCL 100 MG PO TABS
200.0000 mg | ORAL_TABLET | Freq: Every day | ORAL | Status: DC
  Administered 2016-03-13 – 2016-03-18 (×7): 200 mg via ORAL
  Filled 2016-03-12 (×8): qty 2

## 2016-03-12 MED ORDER — CLONAZEPAM 1 MG PO TABS
1.0000 mg | ORAL_TABLET | Freq: Every day | ORAL | Status: DC | PRN
  Administered 2016-03-15 – 2016-03-18 (×4): 1 mg via ORAL
  Filled 2016-03-12 (×4): qty 1

## 2016-03-12 NOTE — H&P (Signed)
History and Physical    Dorothy White [id number] DOB: [birth year] DOA: 03/12/2016  PCP: Jonathon Bellows, MD Consultants:  [name] - cardiology; Nelva Bush - orthopedics Patient coming from: home - lives with friend; NOK: Algis Liming, [phone]  Chief Complaint: ongoing abdominal pain  HPI: Dorothy White is a 65 y.o. female with medical history significant of July admission with abnormal nuclear stress test but mild (25% stenosis) LAD disease and normal LV function.  She also has a h/o diverticulitis.  Patient started last week on Tuesday with emesis overnight, concerned about food poisoning.  Started with abdominal pain like diverticulits on Wednesday, has had it before.  She called PCP, who called in Cipro.  Has been taking and it wasn't getting any better so she went to PCP office and was sent to ER.  Fever to 101.7 last night.  +chills.  Pain ongoing in LLQ.  Slight nausea, no vomiting.  Had diarrhea Saturday all day and night, no BM since.     ED Course: Per Dr. Gilford Raid: Sigmoid diverticulitis, colonic diverticular abscess, intractable pain.  Pt d/w Dr. Barry Dienes (general surgery) who will consult on patient.  I spoke with Dr. Lorin Mercy who will admit patient.  Review of Systems: As per HPI; otherwise 10 point review of systems reviewed and negative.   Ambulatory Status: walk independently  Past Medical History:  Diagnosis Date  . Anemia   . Anxiety   . Arrhythmia   . Arthritis   . Cancer ([place])    basal cell skin cancer  . Chronic back pain   . Constipation   . Depression   . Diverticulitis   . GERD (gastroesophageal reflux disease)   . Headache(784.0)    Migraines  . HLD (hyperlipidemia)   . Stroke [facility])    "mini" stroke - 06/2013    Past Surgical History:  Procedure Laterality Date  . ABDOMINAL HYSTERECTOMY    . BREAST ENHANCEMENT SURGERY    . BUNIONECTOMY Bilateral   . CARDIAC CATHETERIZATION  02/20/2016  . CARDIAC CATHETERIZATION N/A 02/20/2016   Procedure:  Left Heart Cath and Coronary Angiography;  Surgeon: Jettie Booze, MD;  Location: [place] [facility];  Service: Cardiovascular;  Laterality: N/A;  . COLONOSCOPY    . OOPHORECTOMY Bilateral   . ORIF HUMERUS FRACTURE Right 02/15/2015   Procedure: OPEN REDUCTION INTERNAL FIXATION (ORIF) PROXIMAL HUMERUS FRACTURE;  Surgeon: Netta Cedars, MD;  Location: [place];  Service: Orthopedics;  Laterality: Right;  . RHINOPLASTY    . TONSILLECTOMY      Social History   Social History  . Marital status: Divorced    Spouse name: N/A  . Number of children: 0  . Years of education: 12   Occupational History  . retired Financial trader    Retired  .  Power History Main Topics  . Smoking status: Current Some Day Smoker    Types: Cigarettes    Last attempt to quit: 06/30/2011  . Smokeless tobacco: Never Used     Comment: former daily smoker, now rarely  . Alcohol use No     Comment: denies   . Drug use: No  . Sexual activity: No   Other Topics Concern  . Not on file   Social History Narrative   Patient lives at home alone.    Patient is retired.   Education- High school.   Right handed.   Patient drinks one cup of coffee and a big glass of ice  tea.    Allergies  Allergen Reactions  . Penicillins Hives    Has patient had a PCN reaction causing immediate rash, facial/tongue/throat swelling, SOB or lightheadedness with hypotension: Yes Has patient had a PCN reaction causing severe rash involving mucus membranes or skin necrosis: No Has patient had a PCN reaction that required hospitalization No Has patient had a PCN reaction occurring within the last 10 years: No If all of the above answers are "NO", then may proceed with Cephalosporin use.     Family History  Problem Relation Age of Onset  . Lung cancer Mother 26  . Heart attack Father 90  . Hypertension Brother   . Esophageal cancer Brother   . Diabetes Paternal Grandmother     questionable  . Colon  cancer Neg Hx   . Kidney disease Neg Hx   . Liver disease Neg Hx     Prior to Admission medications   Medication Sig Start Date End Date Taking? Authorizing Provider  Ascorbic Acid (VITAMIN C PO) Take 1 tablet by mouth every evening.   Yes Historical Provider, MD  aspirin EC 81 MG tablet Take 81 mg by mouth every other day.   Yes Historical Provider, MD  busPIRone (BUSPAR) 5 MG tablet Take 1 tablet by mouth 3 (three) times daily. 02/12/16  Yes Historical Provider, MD  calcium-vitamin D (OSCAL WITH D) 500-200 MG-UNIT per tablet Take 1 tablet by mouth daily with breakfast.   Yes Historical Provider, MD  Cholecalciferol (VITAMIN D PO) Take 1 capsule by mouth every evening.   Yes Historical Provider, MD  ciprofloxacin (CIPRO) 500 MG tablet Take 1 tablet by mouth 2 (two) times daily. 03/07/16  Yes Historical Provider, MD  clonazePAM (KLONOPIN) 1 MG tablet Take 1 tablet (1 mg total) by mouth daily as needed (anxiety/sleep.). Patient taking differently: Take 1 mg by mouth 2 (two) times daily as needed for anxiety (anxiety/sleep.).  02/15/16  Yes Dorothy Spark, MD  FLUoxetine (PROZAC) 20 MG capsule Take 60 mg by mouth daily.    Yes Historical Provider, MD  levocetirizine (XYZAL ALLERGY 24HR) 5 MG tablet Take 5 mg by mouth every evening.   Yes Historical Provider, MD  LUTEIN PO Take 1 capsule by mouth every evening.   Yes Historical Provider, MD  mometasone (NASONEX) 50 MCG/ACT nasal spray Place 2 sprays into the nose daily.   Yes Historical Provider, MD  Multiple Vitamins-Minerals (ONE-A-DAY WOMENS 50 PLUS PO) Take 1 tablet by mouth every evening.   Yes Historical Provider, MD  oxyCODONE-acetaminophen (PERCOCET) 10-325 MG tablet Take 1 tablet by mouth 4 (four) times daily as needed. 02/01/16  Yes Historical Provider, MD  RELPAX 40 MG tablet Take 1 tablet by mouth as needed for migraine and may take 1 additional tablet in 2 hours if no relief 01/16/16  Yes Historical Provider, MD  traZODone (DESYREL) 100  MG tablet Take 200 mg by mouth at bedtime.    Yes Historical Provider, MD  vitamin E 100 UNIT capsule Take 100 Units by mouth every evening.   Yes Historical Provider, MD    Physical Exam: Vitals:   03/12/16 2215 03/12/16 2230 03/12/16 2245 03/12/16 2321  BP: 132/63 119/61 129/65 (!) 127/49  Pulse: 73 75 76 81  Resp:    16  Temp:    99.3 F (37.4 C)  TempSrc:    Oral  SpO2: 99% 99% 99% 99%  Weight:    59 kg (130 lb)  Height:    5\' 5"  (  1.651 m)     General:  Appears calm and comfortable and is NAD Eyes:  PERRL, EOMI, normal lids, iris ENT:  grossly normal hearing, lips & tongue, mmm Neck:  no LAD, masses or thyromegaly Cardiovascular:  RRR, no m/r/g. No LE edema.  Respiratory:  CTA bilaterally, no w/r/r. Normal respiratory effort. Abdomen:  soft, exquisitely TTP in LLQ > LUQ, nd, NABS Skin:  no rash or induration seen on limited exam Musculoskeletal:  grossly normal tone BUE/BLE, good ROM, no bony abnormality Psychiatric: grossly normal mood and somewhat unusual affect, speech fluent and appropriate, AOx3 Neurologic:  CN 2-12 grossly intact, moves all extremities in coordinated fashion, sensation intact  Labs on Admission: I have personally reviewed following labs and imaging studies  CBC:  Recent Labs Lab 03/12/16 1321  WBC 10.2  HGB 12.0  HCT 38.0  MCV 90.9  PLT [id number]   Basic Metabolic Panel:  Recent Labs Lab 03/12/16 1321  NA 139  K 3.3*  CL 105  CO2 26  GLUCOSE 104*  BUN 7  CREATININE 0.71  CALCIUM 8.9   GFR: Estimated Creatinine Clearance: 63.9 mL/min (by C-G formula based on SCr of 0.8 mg/dL). Liver Function Tests:  Recent Labs Lab 03/12/16 1321  AST 11*  ALT 13*  ALKPHOS 77  BILITOT 0.7  PROT 6.2*  ALBUMIN 3.1*    Recent Labs Lab 03/12/16 1321  LIPASE 16   No results for input(s): AMMONIA in the last 168 hours. Coagulation Profile: No results for input(s): INR, PROTIME in the last 168 hours. Cardiac Enzymes: No results for  input(s): CKTOTAL, CKMB, CKMBINDEX, TROPONINI in the last 168 hours. BNP (last 3 results) No results for input(s): PROBNP in the last 8760 hours. HbA1C: No results for input(s): HGBA1C in the last 72 hours. CBG: No results for input(s): GLUCAP in the last 168 hours. Lipid Profile: No results for input(s): CHOL, HDL, LDLCALC, TRIG, CHOLHDL, LDLDIRECT in the last 72 hours. Thyroid Function Tests: No results for input(s): TSH, T4TOTAL, FREET4, T3FREE, THYROIDAB in the last 72 hours. Anemia Panel: No results for input(s): VITAMINB12, FOLATE, FERRITIN, TIBC, IRON, RETICCTPCT in the last 72 hours. Urine analysis:    Component Value Date/Time   COLORURINE YELLOW 03/12/2016 1819   APPEARANCEUR CLEAR 03/12/2016 1819   LABSPEC 1.026 03/12/2016 1819   PHURINE 6.0 03/12/2016 1819   GLUCOSEU NEGATIVE 03/12/2016 1819   GLUCOSEU NEGATIVE 04/13/2007 1417   HGBUR NEGATIVE 03/12/2016 1819   BILIRUBINUR SMALL (A) 03/12/2016 1819   KETONESUR NEGATIVE 03/12/2016 1819   PROTEINUR 30 (A) 03/12/2016 1819   UROBILINOGEN 0.2 02/04/2015 1800   NITRITE NEGATIVE 03/12/2016 1819   LEUKOCYTESUR NEGATIVE 03/12/2016 1819    Creatinine Clearance: Estimated Creatinine Clearance: 63.9 mL/min (by C-G formula based on SCr of 0.8 mg/dL).  Sepsis Labs: @LABRCNTIP (procalcitonin:4,lacticidven:4) )No results found for this or any previous visit (from the past 240 hour(s)).   Radiological Exams on Admission: Ct Abdomen Pelvis W Contrast  Result Date: 03/12/2016 CLINICAL DATA:  Left lower quadrant pain for 1 week with nausea vomiting and diarrhea. Fever. EXAM: CT ABDOMEN AND PELVIS WITH CONTRAST TECHNIQUE: Multidetector CT imaging of the abdomen and pelvis was performed using the standard protocol following bolus administration of intravenous contrast. CONTRAST:  178mL ISOVUE-300 IOPAMIDOL (ISOVUE-300) INJECTION 61% COMPARISON:  10/28/2013 FINDINGS: Lower chest: Dependent atelectasis seen in the lower lobes bilaterally.  Hepatobiliary: No focal abnormality within the liver parenchyma. There is no evidence for gallstones, gallbladder wall thickening, or pericholecystic fluid. No intrahepatic or extrahepatic  biliary dilation. Pancreas: Ill-defined hypo enhancing soft tissue in the anterior pancreatic head is associated with some mild distention of the main pancreatic duct in the same region. Spleen: No splenomegaly. No focal mass lesion. Adrenals/Urinary Tract: No adrenal nodule or mass. Early excretion of contrast noted in the collecting systems bilaterally. Tiny cysts identified upper pole left kidney. No enhancing lesion identified in either kidney. No evidence for hydroureter. Mild circumferential bladder wall thickening appears slightly out of proportion to the degree of decompression. Stomach/Bowel: Stomach is nondistended. No gastric wall thickening. No evidence of outlet obstruction. Duodenum is normally positioned as is the ligament of Treitz. No small bowel wall thickening. No small bowel dilatation. The terminal ileum is normal. The appendix is not visualized, but there is no edema or inflammation in the region of the cecum. Diverticular changes are noted in the left colon. In the proximal sigmoid segment, there is colonic wall thickening with pericolonic edema/inflammation and a focal relatively thick walled collection of fluid and gas measuring 4.2 x 3.2 x 5.3 cm. Vascular/Lymphatic: No abdominal aortic aneurysm. There is no gastrohepatic or hepatoduodenal ligament lymphadenopathy. No intraperitoneal or retroperitoneal lymphadenopathy. No pelvic sidewall lymphadenopathy. Reproductive: Uterus is surgically absent. There is no adnexal mass. Other: Trace intraperitoneal free fluid noted in the cul-de-sac. Musculoskeletal: Bone windows reveal no worrisome lytic or sclerotic osseous lesions. IMPRESSION: 1. Proximal sigmoid diverticulitis with contained para colonic abscess as above. This is contiguous with the urinary bladder  which may account for the mild circumferential bladder wall thickening. 2. Ill-defined low-attenuation in the head of the pancreas with dilatation of the main pancreatic duct. Adenocarcinoma cannot be excluded. MRI of the abdomen without and with contrast is recommended to further evaluate. 3. Trace free fluid in the cul-de-sac. Electronically Signed   By: Misty Stanley M.D.   On: 03/12/2016 20:32    EKG: Not done  Assessment/Plan Principal Problem:   Diverticulitis Active Problems:   Anxiety and depression   Chronic pain syndrome   Lesion of pancreas   History of chest pain   Hypokalemia   Diverticulitis -Patient with persistent diverticulitis despite treatment with Cipro, now with abscess -Seen by Dr. Barry Dienes who recommends NPO; IVF; IR consultation for possible drain; may need colectomy now or in the near future -Will continue Levaquin for now -Pain control with morphine  Anxiety and depression -Patient with regular use (and possible overuse based on prescribing pattern) of Klonopin, will continue  -Will also continue Buspar, Prozac, Trazodone -Will check UDS  Recent admission for chest pain -Had heart cath which ruled out significant stenosis and also noted normal EF  Hypokalemia -Mild -Will replete and follow  Chronic pain  -I have reviewed this patient in the  Controlled Substances Reporting System.  She is receiving her medications from multiple providers and appears to be taking them as prescribed or slightly more often than prescribed (particularly Klonopin).  Lesion of pancreas -Incidental finding on CT -Will order MRI for further evaluation -Underlying abdominal malignancy may explain, at least in part, recurrent and refractory intraabdominal infection   DVT prophylaxis: SCDs until after IR evaluation for possible drain placement Code Status: DNR - confirmed with patient Family Communication: None present Disposition Plan:  Home once clinically  improved Consults called: General Surgery; IR   Admission status:  Admit to Med Surg    Karmen Bongo MD [facility]  If 7PM-7AM, please contact night-coverage [email or web address] Password [facility]  03/12/2016, 11:44 PM

## 2016-03-12 NOTE — ED Triage Notes (Signed)
Pt reports LLQ pain for a week. Has been started on cipro last week for possible diverticulitis. Pt now having fever, had diarrhea on Saturday and no bowel movement since, had one day of vomiting.

## 2016-03-12 NOTE — Consult Note (Signed)
Reason for Consult:diverticulitis with abscess Referring Physician: [name] is an 65 y.o. female.  HPI:  Pt presents with what she feels like is recurrent diverticulitis.  I am asked to consult by Dr. Particia Nearing for this problem.  She had been on cipro for 1 week but has had worsening pain.  She was unable to sleep last night due to pain.  Nothing made pain better.  Moving made pain worse.  She had diarrhea last week, but no BM since 8/12.  She has been having angina and SOB and had recent heart cath (02/20/16).  No obstructive lesion was found. Has had prior colonoscopy within last 5 years that was negative for polyps.    Past Medical History:  Diagnosis Date  . Anemia   . Anxiety   . Arrhythmia   . Arthritis   . Cancer ([facility])    basal cell skin cancer  . Chronic back pain   . Constipation   . Depression   . Diverticulitis   . GERD (gastroesophageal reflux disease)   . Headache(784.0)    Migraines  . HLD (hyperlipidemia)   . Stroke [facility])    "mini" stroke - 06/2013    Past Surgical History:  Procedure Laterality Date  . ABDOMINAL HYSTERECTOMY    . BREAST ENHANCEMENT SURGERY    . BUNIONECTOMY Bilateral   . CARDIAC CATHETERIZATION  02/20/2016  . CARDIAC CATHETERIZATION N/A 02/20/2016   Procedure: Left Heart Cath and Coronary Angiography;  Surgeon: Corky Crafts, MD;  Location: [facility] INVASIVE [facility];  Service: Cardiovascular;  Laterality: N/A;  . COLONOSCOPY    . OOPHORECTOMY Bilateral   . ORIF HUMERUS FRACTURE Right 02/15/2015   Procedure: OPEN REDUCTION INTERNAL FIXATION (ORIF) PROXIMAL HUMERUS FRACTURE;  Surgeon: Beverely Low, MD;  Location: [facility] OR;  Service: Orthopedics;  Laterality: Right;  . RHINOPLASTY    . TONSILLECTOMY      Family History  Problem Relation Age of Onset  . Lung cancer Mother   . Heart attack Father   . Hypertension Brother   . Esophageal cancer Brother   . Diabetes Paternal Grandmother     questionable  . Colon cancer Neg  Hx   . Kidney disease Neg Hx   . Liver disease Neg Hx     Social History:  reports that she quit smoking about 4 years ago. Her smoking use included Cigarettes. She has never used smokeless tobacco. She reports that she does not drink alcohol or use drugs.  Allergies:  2 Medications:  Ascorbic Acid (VITAMIN C PO)     aspirin EC 81 MG tablet    busPIRone (BUSPAR) 5 MG tablet    calcium-vitamin D (OSCAL WITH D) 500-200 MG-UNIT per tablet    Cholecalciferol (VITAMIN D PO)    ciprofloxacin (CIPRO) 500 MG tablet    clonazePAM (KLONOPIN) 1 MG tablet    FLUoxetine (PROZAC) 20 MG capsule    levocetirizine (XYZAL ALLERGY 24HR) 5 MG tablet    LUTEIN PO    mometasone (NASONEX) 50 MCG/ACT nasal spray    Multiple Vitamins-Minerals (ONE-A-DAY WOMENS 50 PLUS PO)    oxyCODONE-acetaminophen (PERCOCET) 10-325 MG tablet    RELPAX 40 MG tablet    traZODone (DESYREL) 100 MG tablet    vitamin E 100 UNIT capsule      Results for orders placed or performed during the hospital encounter of 03/12/16 (from the past 48 hour(s))  Lipase, blood     Status: None   Collection Time:  03/12/16  1:21 PM  Result Value Ref Range   Lipase 16 11 - 51 U/L  Comprehensive metabolic panel     Status: Abnormal   Collection Time: 03/12/16  1:21 PM  Result Value Ref Range   Sodium 139 135 - 145 mmol/L   Potassium 3.3 (L) 3.5 - 5.1 mmol/L   Chloride 105 101 - 111 mmol/L   CO2 26 22 - 32 mmol/L   Glucose, Bld 104 (H) 65 - 99 mg/dL   BUN 7 6 - 20 mg/dL   Creatinine, Ser 8.33 0.44 - 1.00 mg/dL   Calcium 8.9 8.9 - 27.3 mg/dL   Total Protein 6.2 (L) 6.5 - 8.1 g/dL   Albumin 3.1 (L) 3.5 - 5.0 g/dL   AST 11 (L) 15 - 41 U/L   ALT 13 (L) 14 - 54 U/L   Alkaline Phosphatase 77 38 - 126 U/L   Total Bilirubin 0.7 0.3 - 1.2 mg/dL   GFR calc non Af Amer >60 >60 mL/min   GFR calc Af Amer >60 >60 mL/min    Comment: (NOTE) The eGFR has been calculated using the CKD EPI equation. This calculation has not been validated in all  clinical situations. eGFR's persistently <60 mL/min signify possible Chronic Kidney Disease.    Anion gap 8 5 - 15  CBC     Status: None   Collection Time: 03/12/16  1:21 PM  Result Value Ref Range   WBC 10.2 4.0 - 10.5 K/uL   RBC 4.18 3.87 - 5.11 MIL/uL   Hemoglobin 12.0 12.0 - 15.0 g/dL   HCT 80.9 47.0 - 11.0 %   MCV 90.9 78.0 - 100.0 fL   MCH 28.7 26.0 - 34.0 pg   MCHC 31.6 30.0 - 36.0 g/dL   RDW 43.8 13.4 - 97.5 %   Platelets 307 150 - 400 K/uL  Urinalysis, Routine w reflex microscopic     Status: Abnormal   Collection Time: 03/12/16  6:19 PM  Result Value Ref Range   Color, Urine YELLOW YELLOW   APPearance CLEAR CLEAR   Specific Gravity, Urine 1.026 1.005 - 1.030   pH 6.0 5.0 - 8.0   Glucose, UA NEGATIVE NEGATIVE mg/dL   Hgb urine dipstick NEGATIVE NEGATIVE   Bilirubin Urine SMALL (A) NEGATIVE   Ketones, ur NEGATIVE NEGATIVE mg/dL   Protein, ur 30 (A) NEGATIVE mg/dL   Nitrite NEGATIVE NEGATIVE   Leukocytes, UA NEGATIVE NEGATIVE  Urine microscopic-add on     Status: Abnormal   Collection Time: 03/12/16  6:19 PM  Result Value Ref Range   Squamous Epithelial / LPF 0-5 (A) NONE SEEN   WBC, UA 0-5 0 - 5 WBC/hpf   RBC / HPF 0-5 0 - 5 RBC/hpf   Bacteria, UA RARE (A) NONE SEEN   Urine-Other MUCOUS PRESENT     Ct Abdomen Pelvis W Contrast  Result Date: 03/12/2016 CLINICAL DATA:  Left lower quadrant pain for 1 week with nausea vomiting and diarrhea. Fever. EXAM: CT ABDOMEN AND PELVIS WITH CONTRAST TECHNIQUE: Multidetector CT imaging of the abdomen and pelvis was performed using the standard protocol following bolus administration of intravenous contrast. CONTRAST:  ISOVUE-300 IOPAMIDOL (ISOVUE-300) INJECTION 61% COMPARISON:  10/28/2013 FINDINGS: Lower chest: Dependent atelectasis seen in the lower lobes bilaterally. Hepatobiliary: No focal abnormality within the liver parenchyma. There is no evidence for gallstones, gallbladder wall thickening, or pericholecystic fluid. No  intrahepatic or extrahepatic biliary dilation. Pancreas: Ill-defined hypo enhancing soft tissue in the anterior pancreatic head  is associated with some mild distention of the main pancreatic duct in the same region. Spleen: No splenomegaly. No focal mass lesion. Adrenals/Urinary Tract: No adrenal nodule or mass. Early excretion of contrast noted in the collecting systems bilaterally. Tiny cysts identified upper pole left kidney. No enhancing lesion identified in either kidney. No evidence for hydroureter. Mild circumferential bladder wall thickening appears slightly out of proportion to the degree of decompression. Stomach/Bowel: Stomach is nondistended. No gastric wall thickening. No evidence of outlet obstruction. Duodenum is normally positioned as is the ligament of Treitz. No small bowel wall thickening. No small bowel dilatation. The terminal ileum is normal. The appendix is not visualized, but there is no edema or inflammation in the region of the cecum. Diverticular changes are noted in the left colon. In the proximal sigmoid segment, there is colonic wall thickening with pericolonic edema/inflammation and a focal relatively thick walled collection of fluid and gas measuring 4.2 x 3.2 x 5.3 cm. Vascular/Lymphatic: No abdominal aortic aneurysm. There is no gastrohepatic or hepatoduodenal ligament lymphadenopathy. No intraperitoneal or retroperitoneal lymphadenopathy. No pelvic sidewall lymphadenopathy. Reproductive: Uterus is surgically absent. There is no adnexal mass. Other: Trace intraperitoneal free fluid noted in the cul-de-sac. Musculoskeletal: Bone windows reveal no worrisome lytic or sclerotic osseous lesions. IMPRESSION: 1. Proximal sigmoid diverticulitis with contained para colonic abscess as above. This is contiguous with the urinary bladder which may account for the mild circumferential bladder wall thickening. 2. Ill-defined low-attenuation in the head of the pancreas with dilatation of the main  pancreatic duct. Adenocarcinoma cannot be excluded. MRI of the abdomen without and with contrast is recommended to further evaluate. 3. Trace free fluid in the cul-de-sac. Electronically Signed   By: Kennith Center M.D.   On: 03/12/2016 20:32    Review of Systems  Constitutional: Positive for fever.  HENT: Negative.   Eyes: Negative.   Respiratory: Positive for shortness of breath (in last few weeks).   Cardiovascular: Positive for chest pain.  Gastrointestinal: Positive for diarrhea, nausea and vomiting.  Genitourinary: Negative.   Musculoskeletal: Negative.   Skin: Negative.   Neurological: Negative.   Endo/Heme/Allergies: Negative.   Psychiatric/Behavioral: Negative.    Blood pressure 97/76, pulse 66, temperature 98.6 F (37 C), temperature source Oral, resp. rate 14, SpO2 99 %. Physical Exam  Constitutional: She is oriented to person, place, and time. She appears well-developed and well-nourished. She appears distressed.  HENT:  Head: Normocephalic and atraumatic.  Mouth/Throat: Oropharynx is clear and moist.  Eyes: Conjunctivae are normal. Pupils are equal, round, and reactive to light. Right eye exhibits no discharge. Left eye exhibits no discharge. No scleral icterus.  Neck: Neck supple. No tracheal deviation present.  Cardiovascular: Normal rate, regular rhythm and intact distal pulses.   Respiratory: Effort normal. No respiratory distress.  GI: Soft. There is tenderness (LLQ). There is guarding (voluntary guarding LLQ). There is no rebound.  Musculoskeletal: Normal range of motion. She exhibits no edema, tenderness or deformity.  Neurological: She is alert and oriented to person, place, and time. Coordination normal.  Skin: Skin is warm and dry. No rash noted. She is not diaphoretic. No erythema. No pallor.  Psychiatric: She has a normal mood and affect. Her behavior is normal. Judgment and thought content normal.    Assessment/Plan: Sigmoid diverticulitis with  abscess. CAD with frequent angina H/o CVA   NPO IVF IV antibiotics. Request IR consultation for possible drain. Has had multiple episodes.  May end up needing colectomy this admission or in near  future. Concerned about recent admit with angina, but cath was not bad.       Dorothy White 03/12/2016, 10:21 PM

## 2016-03-12 NOTE — ED Notes (Signed)
Pt made aware of bed assignment 

## 2016-03-12 NOTE — Progress Notes (Signed)
Received report

## 2016-03-12 NOTE — ED Notes (Signed)
RN attempt IV access x 2. Unsuccessful.

## 2016-03-12 NOTE — ED Provider Notes (Signed)
[facility] DEPT Provider Note   CSN: [id number] Arrival date & time: 03/12/16  1238     History   Chief Complaint Chief Complaint  Patient presents with  . Abdominal Pain    HPI LADAJA GLEICH is a 65 y.o. female.  Pt has a hx of diverticulitis and thinks that she has it again.  She said that her dr put her on Cipro and she's been on it for a week with worsening of pain.  The pt said that she was up all night last night with the pain.  She has not had a bm since 8/12 when she had 24 hrs of diarrhea.  She has also had vomiting.      Past Medical History:  Diagnosis Date  . Anemia   . Anxiety   . Arrhythmia   . Arthritis   . Cancer ([place])    basal cell skin cancer  . Chronic back pain   . Constipation   . Depression   . Diverticulitis   . GERD (gastroesophageal reflux disease)   . Headache(784.0)    Migraines  . HLD (hyperlipidemia)   . Stroke [facility])    "mini" stroke - 06/2013    Patient Active Problem List   Diagnosis Date Noted  . Diverticulitis 03/12/2016  . Abnormal nuclear stress test 02/17/2016  . 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 TIA (transient ischemic attack) 12/26/2015  . DOE (dyspnea on exertion) 12/26/2015  . LV dysfunction 12/26/2015  . Right sided weakness 12/17/2015  . Ataxia 12/17/2015  . Shoulder fracture 02/15/2015  . Chronic headache 10/11/2013  . Weakness generalized 08/12/2013  . Dizziness and giddiness 08/12/2013  . Abnormality of gait 07/11/2013  . Hyperlipidemia 05/07/2007  . COMMON MIGRAINE 05/07/2007  . PREMATURE VENTRICULAR CONTRACTIONS, FREQUENT 05/07/2007  . GERD 05/07/2007  . DIVERTICULOSIS, COLON 05/07/2007  . 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 OSTEOPENIA 05/07/2007  . ANXIETY 03/24/2007  . DEPRESSION 03/24/2007  . HEMORRHOIDS 03/24/2007  . ALLERGIC RHINITIS 03/24/2007  . LOW BACK PAIN 03/24/2007  . MIGRAINES, HX  OF 03/24/2007    Past Surgical History:  Procedure Laterality Date  . ABDOMINAL HYSTERECTOMY    . BREAST ENHANCEMENT SURGERY    . BUNIONECTOMY Bilateral   . CARDIAC CATHETERIZATION  02/20/2016  . CARDIAC CATHETERIZATION N/A 02/20/2016   Procedure: Left Heart Cath and Coronary Angiography;  Surgeon: Jettie Booze, MD;  Location: [facility];  Service: Cardiovascular;  Laterality: N/A;  . COLONOSCOPY    . OOPHORECTOMY Bilateral   . ORIF HUMERUS FRACTURE Right 02/15/2015   Procedure: OPEN REDUCTION INTERNAL FIXATION (ORIF) PROXIMAL HUMERUS FRACTURE;  Surgeon: Netta Cedars, MD;  Location: [facility];  Service: Orthopedics;  Laterality: Right;  . RHINOPLASTY    . TONSILLECTOMY      OB History    No data available       Home Medications    Prior to Admission medications   Medication Sig Start Date End Date Taking? Authorizing Provider  Ascorbic Acid (VITAMIN C PO) Take 1 tablet by mouth every evening.   Yes Historical Provider, MD  aspirin EC 81 MG tablet Take 81 mg by mouth every other day.   Yes Historical Provider, MD  busPIRone (BUSPAR) 5 MG tablet Take 1 tablet by mouth 3 (three) times daily. 02/12/16  Yes Historical Provider, MD  calcium-vitamin D (OSCAL WITH D) 500-200 MG-UNIT per tablet Take  1 tablet by mouth daily with breakfast.   Yes Historical Provider, MD  Cholecalciferol (VITAMIN D PO) Take 1 capsule by mouth every evening.   Yes Historical Provider, MD  ciprofloxacin (CIPRO) 500 MG tablet Take 1 tablet by mouth 2 (two) times daily. 03/07/16  Yes Historical Provider, MD  clonazePAM (KLONOPIN) 1 MG tablet Take 1 tablet (1 mg total) by mouth daily as needed (anxiety/sleep.). Patient taking differently: Take 1 mg by mouth 2 (two) times daily as needed for anxiety (anxiety/sleep.).  02/15/16  Yes Dorothy Spark, MD  FLUoxetine (PROZAC) 20 MG capsule Take 60 mg by mouth daily.    Yes Historical Provider, MD  levocetirizine (XYZAL ALLERGY 24HR) 5 MG tablet Take 5 mg by  mouth every evening.   Yes Historical Provider, MD  LUTEIN PO Take 1 capsule by mouth every evening.   Yes Historical Provider, MD  mometasone (NASONEX) 50 MCG/ACT nasal spray Place 2 sprays into the nose daily.   Yes Historical Provider, MD  Multiple Vitamins-Minerals (ONE-A-DAY WOMENS 50 PLUS PO) Take 1 tablet by mouth every evening.   Yes Historical Provider, MD  oxyCODONE-acetaminophen (PERCOCET) 10-325 MG tablet Take 1 tablet by mouth 4 (four) times daily as needed. 02/01/16  Yes Historical Provider, MD  RELPAX 40 MG tablet Take 1 tablet by mouth as needed for migraine and may take 1 additional tablet in 2 hours if no relief 01/16/16  Yes Historical Provider, MD  traZODone (DESYREL) 100 MG tablet Take 200 mg by mouth at bedtime.    Yes Historical Provider, MD  vitamin E 100 UNIT capsule Take 100 Units by mouth every evening.   Yes Historical Provider, MD    Family History Family History  Problem Relation Age of Onset  . Lung cancer Mother   . Heart attack Father   . Hypertension Brother   . Esophageal cancer Brother   . Diabetes Paternal Grandmother     questionable  . Colon cancer Neg Hx   . Kidney disease Neg Hx   . Liver disease Neg Hx     Social History Social History  Substance Use Topics  . Smoking status: Former Smoker    Types: Cigarettes    Quit date: 06/30/2011  . Smokeless tobacco: Never Used  . Alcohol use No     Comment: denies      Allergies   Penicillins   Review of Systems Review of Systems  Gastrointestinal: Positive for abdominal pain, diarrhea, nausea and vomiting.  All other systems reviewed and are negative.    Physical Exam Updated Vital Signs BP 132/63   Pulse 73   Temp 98.6 F (37 C) (Oral)   Resp 14   SpO2 99%   Physical Exam  Constitutional: She is oriented to person, place, and time. She appears well-developed. She appears distressed.  HENT:  Head: Normocephalic and atraumatic.  Right Ear: External ear normal.  Left Ear:  External ear normal.  Nose: Nose normal.  Mouth/Throat: Oropharynx is clear and moist.  Eyes: Conjunctivae and EOM are normal. Pupils are equal, round, and reactive to light.  Neck: Normal range of motion. Neck supple.  Cardiovascular: Normal rate, regular rhythm, normal heart sounds and intact distal pulses.   Pulmonary/Chest: Effort normal and breath sounds normal.  Abdominal: Soft. Normal appearance. There is tenderness in the left lower quadrant.  Musculoskeletal: Normal range of motion.  Neurological: She is alert and oriented to person, place, and time.  Skin: Skin is warm and dry.  Psychiatric:  She has a normal mood and affect. Her behavior is normal. Judgment and thought content normal.  Nursing note and vitals reviewed.    ED Treatments / Results  Labs (all labs ordered are listed, but only abnormal results are displayed) Labs Reviewed  COMPREHENSIVE METABOLIC PANEL - Abnormal; Notable for the following:       Result Value   Potassium 3.3 (*)    Glucose, Bld 104 (*)    Total Protein 6.2 (*)    Albumin 3.1 (*)    AST 11 (*)    ALT 13 (*)    All other components within normal limits  URINALYSIS, ROUTINE W REFLEX MICROSCOPIC (NOT AT [facility]) - Abnormal; Notable for the following:    Bilirubin Urine SMALL (*)    Protein, ur 30 (*)    All other components within normal limits  URINE MICROSCOPIC-ADD ON - Abnormal; Notable for the following:    Squamous Epithelial / LPF 0-5 (*)    Bacteria, UA RARE (*)    All other components within normal limits  LIPASE, BLOOD  CBC    EKG  EKG Interpretation None       Radiology Ct Abdomen Pelvis W Contrast  Result Date: 03/12/2016 CLINICAL DATA:  Left lower quadrant pain for 1 week with nausea vomiting and diarrhea. Fever. EXAM: CT ABDOMEN AND PELVIS WITH CONTRAST TECHNIQUE: Multidetector CT imaging of the abdomen and pelvis was performed using the standard protocol following bolus administration of intravenous contrast. CONTRAST:   119mL ISOVUE-300 IOPAMIDOL (ISOVUE-300) INJECTION 61% COMPARISON:  10/28/2013 FINDINGS: Lower chest: Dependent atelectasis seen in the lower lobes bilaterally. Hepatobiliary: No focal abnormality within the liver parenchyma. There is no evidence for gallstones, gallbladder wall thickening, or pericholecystic fluid. No intrahepatic or extrahepatic biliary dilation. Pancreas: Ill-defined hypo enhancing soft tissue in the anterior pancreatic head is associated with some mild distention of the main pancreatic duct in the same region. Spleen: No splenomegaly. No focal mass lesion. Adrenals/Urinary Tract: No adrenal nodule or mass. Early excretion of contrast noted in the collecting systems bilaterally. Tiny cysts identified upper pole left kidney. No enhancing lesion identified in either kidney. No evidence for hydroureter. Mild circumferential bladder wall thickening appears slightly out of proportion to the degree of decompression. Stomach/Bowel: Stomach is nondistended. No gastric wall thickening. No evidence of outlet obstruction. Duodenum is normally positioned as is the ligament of Treitz. No small bowel wall thickening. No small bowel dilatation. The terminal ileum is normal. The appendix is not visualized, but there is no edema or inflammation in the region of the cecum. Diverticular changes are noted in the left colon. In the proximal sigmoid segment, there is colonic wall thickening with pericolonic edema/inflammation and a focal relatively thick walled collection of fluid and gas measuring 4.2 x 3.2 x 5.3 cm. Vascular/Lymphatic: No abdominal aortic aneurysm. There is no gastrohepatic or hepatoduodenal ligament lymphadenopathy. No intraperitoneal or retroperitoneal lymphadenopathy. No pelvic sidewall lymphadenopathy. Reproductive: Uterus is surgically absent. There is no adnexal mass. Other: Trace intraperitoneal free fluid noted in the cul-de-sac. Musculoskeletal: Bone windows reveal no worrisome lytic or  sclerotic osseous lesions. IMPRESSION: 1. Proximal sigmoid diverticulitis with contained para colonic abscess as above. This is contiguous with the urinary bladder which may account for the mild circumferential bladder wall thickening. 2. Ill-defined low-attenuation in the head of the pancreas with dilatation of the main pancreatic duct. Adenocarcinoma cannot be excluded. MRI of the abdomen without and with contrast is recommended to further evaluate. 3. Trace free fluid in  the cul-de-sac. Electronically Signed   By: Misty Stanley M.D.   On: 03/12/2016 20:32    Procedures Procedures (including critical care time)  Medications Ordered in ED Medications  levofloxacin (LEVAQUIN) IVPB 750 mg (750 mg Intravenous New Bag/Given 03/12/16 2150)  sodium chloride 0.9 % bolus 1,000 mL (1,000 mLs Intravenous New Bag/Given 03/12/16 1940)  morphine 4 MG/ML injection 4 mg (4 mg Intravenous Given 03/12/16 1937)  ondansetron (ZOFRAN) injection 4 mg (4 mg Intravenous Given 03/12/16 1937)  iopamidol (ISOVUE-300) 61 % injection (100 mLs  Contrast Given 03/12/16 1945)  morphine 4 MG/ML injection 4 mg (4 mg Intravenous Given 03/12/16 2128)  metroNIDAZOLE (FLAGYL) IVPB 500 mg (500 mg Intravenous New Bag/Given 03/12/16 2128)  sodium chloride 0.9 % bolus 1,000 mL (0 mLs Intravenous Stopped 03/12/16 2230)     Initial Impression / Assessment and Plan / ED Course  I have reviewed the triage vital signs and the nursing notes.  Pertinent labs & imaging results that were available during my care of the patient were reviewed by me and considered in my medical decision making (see chart for details).  Clinical Course   Pt d/w Dr. Barry Dienes (general surgery) who will consult on patient.  I spoke with Dr. Lorin Mercy who will admit patient.  Final Clinical Impressions(s) / ED Diagnoses   Final diagnoses:  Sigmoid diverticulitis  Colonic diverticular abscess  Intractable pain    New Prescriptions New Prescriptions   No medications  on file     Isla Pence, MD 03/12/16 2236

## 2016-03-13 ENCOUNTER — Inpatient Hospital Stay (HOSPITAL_COMMUNITY): Payer: BLUE CROSS/BLUE SHIELD

## 2016-03-13 ENCOUNTER — Ambulatory Visit: Payer: BLUE CROSS/BLUE SHIELD | Admitting: Gastroenterology

## 2016-03-13 ENCOUNTER — Encounter: Payer: Self-pay | Admitting: Cardiology

## 2016-03-13 LAB — CBC
HEMATOCRIT: 34 % — AB (ref 36.0–46.0)
Hemoglobin: 10.5 g/dL — ABNORMAL LOW (ref 12.0–15.0)
MCH: 28.1 pg (ref 26.0–34.0)
MCHC: 30.9 g/dL (ref 30.0–36.0)
MCV: 90.9 fL (ref 78.0–100.0)
Platelets: 247 10*3/uL (ref 150–400)
RBC: 3.74 MIL/uL — ABNORMAL LOW (ref 3.87–5.11)
RDW: 13.5 % (ref 11.5–15.5)
WBC: 8.2 10*3/uL (ref 4.0–10.5)

## 2016-03-13 LAB — RAPID URINE DRUG SCREEN, HOSP PERFORMED
Amphetamines: NOT DETECTED
BARBITURATES: NOT DETECTED
Benzodiazepines: POSITIVE — AB
COCAINE: NOT DETECTED
OPIATES: NOT DETECTED
Tetrahydrocannabinol: NOT DETECTED

## 2016-03-13 LAB — PROTIME-INR
INR: 1.23
PROTHROMBIN TIME: 15.6 s — AB (ref 11.4–15.2)

## 2016-03-13 LAB — BASIC METABOLIC PANEL
Anion gap: 6 (ref 5–15)
CALCIUM: 8.6 mg/dL — AB (ref 8.9–10.3)
CO2: 27 mmol/L (ref 22–32)
Chloride: 107 mmol/L (ref 101–111)
Creatinine, Ser: 0.63 mg/dL (ref 0.44–1.00)
GFR calc Af Amer: >60 mL/min (ref >60)
GLUCOSE: 95 mg/dL (ref 65–99)
Potassium: 3.6 mmol/L (ref 3.5–5.1)
Sodium: 140 mmol/L (ref 135–145)

## 2016-03-13 LAB — APTT: APTT: 33 s (ref 24–36)

## 2016-03-13 MED ORDER — MIDAZOLAM HCL 2 MG/2ML IJ SOLN
INTRAMUSCULAR | Status: AC | PRN
  Administered 2016-03-13 (×2): 1 mg via INTRAVENOUS

## 2016-03-13 MED ORDER — GADOBENATE DIMEGLUMINE 529 MG/ML IV SOLN
15.0000 mL | Freq: Once | INTRAVENOUS | Status: AC | PRN
  Administered 2016-03-13: 13 mL via INTRAVENOUS

## 2016-03-13 MED ORDER — MIDAZOLAM HCL 2 MG/2ML IJ SOLN
INTRAMUSCULAR | Status: AC
  Filled 2016-03-13: qty 4

## 2016-03-13 MED ORDER — FENTANYL CITRATE (PF) 100 MCG/2ML IJ SOLN
INTRAMUSCULAR | Status: AC
  Filled 2016-03-13: qty 4

## 2016-03-13 MED ORDER — POTASSIUM CHLORIDE 10 MEQ/100ML IV SOLN
INTRAVENOUS | Status: AC
  Administered 2016-03-13: 10 meq
  Filled 2016-03-13: qty 100

## 2016-03-13 MED ORDER — BIOTENE DRY MOUTH MT LIQD: 15.0000 mL | OROMUCOSAL | Status: DC | PRN

## 2016-03-13 MED ORDER — FENTANYL CITRATE (PF) 100 MCG/2ML IJ SOLN
INTRAMUSCULAR | Status: AC | PRN
  Administered 2016-03-13 (×2): 50 ug via INTRAVENOUS

## 2016-03-13 MED ORDER — METRONIDAZOLE IN NACL 5-0.79 MG/ML-% IV SOLN
500.0000 mg | Freq: Three times a day (TID) | INTRAVENOUS | Status: DC
  Administered 2016-03-13 – 2016-03-15 (×8): 500 mg via INTRAVENOUS
  Filled 2016-03-13 (×8): qty 100

## 2016-03-13 MED ORDER — LIDOCAINE HCL 1 % IJ SOLN
INTRAMUSCULAR | Status: AC
  Filled 2016-03-13: qty 20

## 2016-03-13 MED ORDER — ENSURE ENLIVE PO LIQD
237.0000 mL | Freq: Two times a day (BID) | ORAL | Status: DC
  Administered 2016-03-15 – 2016-03-17 (×4): 237 mL via ORAL

## 2016-03-13 NOTE — Consult Note (Signed)
Chief Complaint: Patient was seen in consultation today for diverticular abscess drain Chief Complaint  Patient presents with  . Abdominal Pain   at the request of Dr Stark Klein  Referring Physician(s): Dr Stark Klein  Supervising Physician: Corrie Mckusick  Patient Status: Inpatient  History of Present Illness: Dorothy White is a 65 y.o. female   abd pain ; LLQ for few days Has hx diverticulitis N/D/Chills  CT 8/16:IMPRESSION: 1. Proximal sigmoid diverticulitis with contained para colonic abscess. This is contiguous with the urinary bladder which may account for the mild circumferential bladder wall thickening. 2. Ill-defined low-attenuation in the head of the pancreas with dilatation of the main pancreatic duct. Adenocarcinoma cannot be excluded. MRI of the abdomen without and with contrast is recommended to further evaluate. 3. Trace free fluid in the cul-de-sac.  Request for abscess drain placement Dr Earleen Newport has reviewed imaging and approves procedure  Past Medical History:  Diagnosis Date  . Anemia   . Anxiety   . Arrhythmia   . Arthritis   . Cancer ([place])    basal cell skin cancer  . Chronic back pain   . Constipation   . Depression   . Diverticulitis   . GERD (gastroesophageal reflux disease)   . Headache(784.0)    Migraines  . HLD (hyperlipidemia)   . Stroke [facility])    "mini" stroke - 06/2013    Past Surgical History:  Procedure Laterality Date  . ABDOMINAL HYSTERECTOMY    . BREAST ENHANCEMENT SURGERY    . BUNIONECTOMY Bilateral   . CARDIAC CATHETERIZATION  02/20/2016  . CARDIAC CATHETERIZATION N/A 02/20/2016   Procedure: Left Heart Cath and Coronary Angiography;  Surgeon: Jettie Booze, MD;  Location: [place] [facility];  Service: Cardiovascular;  Laterality: N/A;  . COLONOSCOPY    . OOPHORECTOMY Bilateral   . ORIF HUMERUS FRACTURE Right 02/15/2015   Procedure: OPEN REDUCTION INTERNAL FIXATION (ORIF) PROXIMAL HUMERUS FRACTURE;   Surgeon: Netta Cedars, MD;  Location: [place];  Service: Orthopedics;  Laterality: Right;  . RHINOPLASTY    . TONSILLECTOMY      Allergies: Penicillins  Medications: Prior to Admission medications   Medication Sig Start Date End Date Taking? Authorizing Provider  Ascorbic Acid (VITAMIN C PO) Take 1 tablet by mouth every evening.   Yes Historical Provider, MD  aspirin EC 81 MG tablet Take 81 mg by mouth every other day.   Yes Historical Provider, MD  busPIRone (BUSPAR) 5 MG tablet Take 1 tablet by mouth 3 (three) times daily. 02/12/16  Yes Historical Provider, MD  calcium-vitamin D (OSCAL WITH D) 500-200 MG-UNIT per tablet Take 1 tablet by mouth daily with breakfast.   Yes Historical Provider, MD  Cholecalciferol (VITAMIN D PO) Take 1 capsule by mouth every evening.   Yes Historical Provider, MD  ciprofloxacin (CIPRO) 500 MG tablet Take 1 tablet by mouth 2 (two) times daily. 03/07/16  Yes Historical Provider, MD  clonazePAM (KLONOPIN) 1 MG tablet Take 1 tablet (1 mg total) by mouth daily as needed (anxiety/sleep.). Patient taking differently: Take 1 mg by mouth 2 (two) times daily as needed for anxiety (anxiety/sleep.).  02/15/16  Yes Dorothy Spark, MD  FLUoxetine (PROZAC) 20 MG capsule Take 60 mg by mouth daily.    Yes Historical Provider, MD  levocetirizine (XYZAL ALLERGY 24HR) 5 MG tablet Take 5 mg by mouth every evening.   Yes Historical Provider, MD  LUTEIN PO Take 1 capsule by mouth every evening.  Yes Historical Provider, MD  mometasone (NASONEX) 50 MCG/ACT nasal spray Place 2 sprays into the nose daily.   Yes Historical Provider, MD  Multiple Vitamins-Minerals (ONE-A-DAY WOMENS 50 PLUS PO) Take 1 tablet by mouth every evening.   Yes Historical Provider, MD  oxyCODONE-acetaminophen (PERCOCET) 10-325 MG tablet Take 1 tablet by mouth 4 (four) times daily as needed. 02/01/16  Yes Historical Provider, MD  RELPAX 40 MG tablet Take 1 tablet by mouth as needed for migraine and may take 1  additional tablet in 2 hours if no relief 01/16/16  Yes Historical Provider, MD  traZODone (DESYREL) 100 MG tablet Take 200 mg by mouth at bedtime.    Yes Historical Provider, MD  vitamin E 100 UNIT capsule Take 100 Units by mouth every evening.   Yes Historical Provider, MD     Family History  Problem Relation Age of Onset  . Lung cancer Mother 106  . Heart attack Father 77  . Hypertension Brother   . Esophageal cancer Brother   . Diabetes Paternal Grandmother     questionable  . Colon cancer Neg Hx   . Kidney disease Neg Hx   . Liver disease Neg Hx     Social History   Social History  . Marital status: Divorced    Spouse name: N/A  . Number of children: 0  . Years of education: 12   Occupational History  . retired Financial trader    Retired  .  [place] History Main Topics  . Smoking status: Current Some Day Smoker    Types: Cigarettes    Last attempt to quit: 06/30/2011  . Smokeless tobacco: Never Used     Comment: former daily smoker, now rarely  . Alcohol use No     Comment: denies   . Drug use: No  . Sexual activity: No   Other Topics Concern  . None   Social History Narrative   Patient lives at home alone.    Patient is retired.   Education- High school.   Right handed.   Patient drinks one cup of coffee and a big glass of ice tea.    Review of Systems: A 12 point ROS discussed and pertinent positives are indicated in the HPI above.  All other systems are negative.  Review of Systems  Constitutional: Positive for activity change, appetite change, chills, fatigue and fever.  Respiratory: Negative for shortness of breath.   Gastrointestinal: Positive for abdominal distention, abdominal pain, diarrhea and nausea.  Neurological: Positive for weakness.  Psychiatric/Behavioral: Negative for behavioral problems and confusion.    Vital Signs: BP (!) 99/41 (BP Location: Left Arm)   Pulse 83   Temp 98.7 F (37.1 C) (Oral)   Resp 17   Ht  5\' 5"  (1.651 m)   Wt 130 lb (59 kg)   SpO2 96%   BMI 21.63 kg/m   Physical Exam  Constitutional: She is oriented to person, place, and time.  Cardiovascular: Normal rate, regular rhythm and normal heart sounds.   Pulmonary/Chest: Effort normal and breath sounds normal.  Abdominal: Soft. Bowel sounds are normal. She exhibits distension. There is tenderness.  Musculoskeletal: Normal range of motion.  Neurological: She is alert and oriented to person, place, and time.  Skin: Skin is warm and dry.  Psychiatric: She has a normal mood and affect. Her behavior is normal. Judgment and thought content normal.  Nursing note and vitals reviewed.   Mallampati Score:  MD Evaluation  Airway: WNL Heart: WNL Abdomen: WNL Chest/ Lungs: WNL ASA  Classification: 2 Mallampati/Airway Score: One  Imaging: Mr Abdomen W Wo Contrast  Result Date: 03/13/2016 CLINICAL DATA:  CT demonstrating a possible pancreatic head lesion. EXAM: MRI ABDOMEN WITHOUT AND WITH CONTRAST TECHNIQUE: Multiplanar multisequence MR imaging of the abdomen was performed both before and after the administration of intravenous contrast. CONTRAST:  68mL MULTIHANCE GADOBENATE DIMEGLUMINE 529 MG/ML IV SOLN COMPARISON:  03/12/2016 CT FINDINGS: Lower chest: Bilateral breast implants. Bibasilar atelectasis. Normal heart size without pericardial or pleural effusion. Hepatobiliary: No focal liver lesion. Hepatomegaly at 21.4 cm. There is minimal left anterior hepatic lobe intrahepatic duct dilatation, of indeterminate etiology. Example image 17/ series 12003. Normal gallbladder, without biliary ductal dilatation. Pancreas: No pancreatic duct dilatation. There is a variant of pancreas divisum, with a prominent dorsal duct entering the duodenum on image 26/ series 3. Communication with the common duct is identified. The CT abnormality was likely secondary to the focal fat deposition within the pancreas. Example image 51/series 9. No acute  pancreatitis. Spleen: Normal in size, without focal abnormality. Adrenals/Urinary Tract: Normal adrenal glands. Bilateral renal cysts. No hydronephrosis. Stomach/Bowel: Normal stomach and abdominal bowel loops. Vascular/Lymphatic: Normal caliber of the aorta and branch vessels. No retroperitoneal or retrocrural adenopathy. Other: No ascites. Musculoskeletal: No acute osseous abnormality. IMPRESSION: 1. The CT abnormality represents focal fat deposition within the pancreatic head. No suspicious pancreatic lesion. 2.  No acute abdominal process. 3. Variant of pancreas divisum.  No acute pancreatitis. Electronically Signed   By: Abigail Miyamoto M.D.   On: 03/13/2016 08:51   Ct Abdomen Pelvis W Contrast  Result Date: 03/12/2016 CLINICAL DATA:  Left lower quadrant pain for 1 week with nausea vomiting and diarrhea. Fever. EXAM: CT ABDOMEN AND PELVIS WITH CONTRAST TECHNIQUE: Multidetector CT imaging of the abdomen and pelvis was performed using the standard protocol following bolus administration of intravenous contrast. CONTRAST:  158mL ISOVUE-300 IOPAMIDOL (ISOVUE-300) INJECTION 61% COMPARISON:  10/28/2013 FINDINGS: Lower chest: Dependent atelectasis seen in the lower lobes bilaterally. Hepatobiliary: No focal abnormality within the liver parenchyma. There is no evidence for gallstones, gallbladder wall thickening, or pericholecystic fluid. No intrahepatic or extrahepatic biliary dilation. Pancreas: Ill-defined hypo enhancing soft tissue in the anterior pancreatic head is associated with some mild distention of the main pancreatic duct in the same region. Spleen: No splenomegaly. No focal mass lesion. Adrenals/Urinary Tract: No adrenal nodule or mass. Early excretion of contrast noted in the collecting systems bilaterally. Tiny cysts identified upper pole left kidney. No enhancing lesion identified in either kidney. No evidence for hydroureter. Mild circumferential bladder wall thickening appears slightly out of  proportion to the degree of decompression. Stomach/Bowel: Stomach is nondistended. No gastric wall thickening. No evidence of outlet obstruction. Duodenum is normally positioned as is the ligament of Treitz. No small bowel wall thickening. No small bowel dilatation. The terminal ileum is normal. The appendix is not visualized, but there is no edema or inflammation in the region of the cecum. Diverticular changes are noted in the left colon. In the proximal sigmoid segment, there is colonic wall thickening with pericolonic edema/inflammation and a focal relatively thick walled collection of fluid and gas measuring 4.2 x 3.2 x 5.3 cm. Vascular/Lymphatic: No abdominal aortic aneurysm. There is no gastrohepatic or hepatoduodenal ligament lymphadenopathy. No intraperitoneal or retroperitoneal lymphadenopathy. No pelvic sidewall lymphadenopathy. Reproductive: Uterus is surgically absent. There is no adnexal mass. Other: Trace intraperitoneal free fluid noted in the cul-de-sac. Musculoskeletal: Bone windows reveal no worrisome  lytic or sclerotic osseous lesions. IMPRESSION: 1. Proximal sigmoid diverticulitis with contained para colonic abscess as above. This is contiguous with the urinary bladder which may account for the mild circumferential bladder wall thickening. 2. Ill-defined low-attenuation in the head of the pancreas with dilatation of the main pancreatic duct. Adenocarcinoma cannot be excluded. MRI of the abdomen without and with contrast is recommended to further evaluate. 3. Trace free fluid in the cul-de-sac. Electronically Signed   By: Misty Stanley M.D.   On: 03/12/2016 20:32   Mr 3d Recon At Scanner  Result Date: 03/13/2016 CLINICAL DATA:  CT demonstrating a possible pancreatic head lesion. EXAM: MRI ABDOMEN WITHOUT AND WITH CONTRAST TECHNIQUE: Multiplanar multisequence MR imaging of the abdomen was performed both before and after the administration of intravenous contrast. CONTRAST:  36mL MULTIHANCE  GADOBENATE DIMEGLUMINE 529 MG/ML IV SOLN COMPARISON:  03/12/2016 CT FINDINGS: Lower chest: Bilateral breast implants. Bibasilar atelectasis. Normal heart size without pericardial or pleural effusion. Hepatobiliary: No focal liver lesion. Hepatomegaly at 21.4 cm. There is minimal left anterior hepatic lobe intrahepatic duct dilatation, of indeterminate etiology. Example image 17/ series 12003. Normal gallbladder, without biliary ductal dilatation. Pancreas: No pancreatic duct dilatation. There is a variant of pancreas divisum, with a prominent dorsal duct entering the duodenum on image 26/ series 3. Communication with the common duct is identified. The CT abnormality was likely secondary to the focal fat deposition within the pancreas. Example image 51/series 9. No acute pancreatitis. Spleen: Normal in size, without focal abnormality. Adrenals/Urinary Tract: Normal adrenal glands. Bilateral renal cysts. No hydronephrosis. Stomach/Bowel: Normal stomach and abdominal bowel loops. Vascular/Lymphatic: Normal caliber of the aorta and branch vessels. No retroperitoneal or retrocrural adenopathy. Other: No ascites. Musculoskeletal: No acute osseous abnormality. IMPRESSION: 1. The CT abnormality represents focal fat deposition within the pancreatic head. No suspicious pancreatic lesion. 2.  No acute abdominal process. 3. Variant of pancreas divisum.  No acute pancreatitis. Electronically Signed   By: Abigail Miyamoto M.D.   On: 03/13/2016 08:51    Labs:  CBC:  Recent Labs  01/23/16 2017 02/15/16 1536 03/12/16 1321 03/13/16 0451  WBC 6.9 5.8 10.2 8.2  HGB 14.2 13.0 12.0 10.5*  HCT 44.4 39.9 38.0 34.0*  PLT 291 245 307 247    COAGS:  Recent Labs  12/17/15 2112 02/15/16 1536 03/13/16 0651  INR 1.03 1.0 1.23  APTT 23*  --  33    BMP:  Recent Labs  12/19/15 0240 01/23/16 2017 02/06/16 0906 02/15/16 1536 03/12/16 1321 03/13/16 0451  NA 141 137 140 145 139 140  K 4.5 3.6 4.0 4.0 3.3* 3.6  CL  109 99* 105 99 105 107  CO2 28 28 24  36* 26 27  GLUCOSE 86 96 110* 77 104* 95  BUN 11 7 21  6* 7 <5*  CALCIUM 8.2* 10.7* 9.2 9.1 8.9 8.6*  CREATININE 0.66 0.73 0.66 0.95 0.71 0.63  GFRNONAA >60 >60  --   --  >60 >60  GFRAA >60 >60  --   --  >60 >60    LIVER FUNCTION TESTS:  Recent Labs  12/17/15 2112 12/18/15 0510 02/06/16 0906 03/12/16 1321  BILITOT 0.6 0.7 0.3 0.7  AST 18 14* 18 11*  ALT 18 16 16  13*  ALKPHOS 85 78 101 77  PROT 6.0* 5.8* 6.3 6.2*  ALBUMIN 3.5 3.1* 3.9 3.1*    TUMOR MARKERS: No results for input(s): AFPTM, CEA, CA199, CHROMGRNA in the last 8760 hours.  Assessment and Plan:  Diverticular abscess  Now scheduled for abscess drain placement Risks and Benefits discussed with the patient including bleeding, infection, damage to adjacent structures, bowel perforation/fistula connection, and sepsis. All of the patient's questions were answered, patient is agreeable to proceed. Consent signed and in chart.   Thank you for this interesting consult.  I greatly enjoyed meeting Dorothy White and look forward to participating in their care.  A copy of this report was sent to the requesting provider on this date.  Electronically Signed: Jackelyn Illingworth A 03/13/2016, 9:14 AM   I spent a total of 40 Minutes    in face to face in clinical consultation, greater than 50% of which was counseling/coordinating care for diverticular abscess drain

## 2016-03-13 NOTE — Progress Notes (Signed)
Initial Nutrition Assessment  DOCUMENTATION CODES:   Not applicable  INTERVENTION:  Once diet advances, provide Ensure Enlive po BID, each supplement provides 350 kcal and 20 grams of protein.  NUTRITION DIAGNOSIS:   Inadequate oral intake related to altered GI function as evidenced by per patient/family report.  GOAL:   Patient will meet greater than or equal to 90% of their needs  MONITOR:   Supplement acceptance, Diet advancement, Labs, Weight trends, Skin, I & O's  REASON FOR ASSESSMENT:   Malnutrition Screening Tool    ASSESSMENT:   65 y.o. female with history of nonobstructive CAD (LHC July 2017 showed 25% stenosis at the mid LAD) GERD, depression who presented for evaluation of left lower abdominal pain. Further evaluation revealed sigmoid diverticulitis with a abscess. Gen. surgery was consulted, subsequently underwent CT-guided placement of a pigtail drain on 8/17.  Pt is currently NPO. Pt reports abdominal pain has improved just a little. PT reports poor po intake over the past 1 week. Pt reports she has only been consuming water, juice, and 1 egg a day. Pt reports weight has been stable however. Pt currently has Ensure ordered. RD to continue with current orders. Nursing staff to provide once diet advances.   Nutrition-Focused physical exam completed. Findings are no fat depletion, mild depletion, and no edema.   Labs and medications reviewed.   Diet Order:  Diet NPO time specified  Skin:  Reviewed, no issues  Last BM:  Unknown  Height:   Ht Readings from Last 1 Encounters:  03/12/16 5\' 5"  (1.651 m)    Weight:   Wt Readings from Last 1 Encounters:  03/12/16 130 lb (59 kg)    Ideal Body Weight:  56.8 kg  BMI:  Body mass index is 21.63 kg/m.  Estimated Nutritional Needs:   Kcal:  1600-1800  Protein:  70-80 grams  Fluid:  1.6 - 1.8 L/day  EDUCATION NEEDS:   No education needs identified at this time  Corrin Parker, MS, RD, LDN Pager #  [phone] After hours/ weekend pager # [phone]

## 2016-03-13 NOTE — Procedures (Signed)
Interventional Radiology Procedure Note  Procedure: Placement of a 11F pigtail drain, anterior approach into abscess.  ~10-15cc of purulent material aspirated.  Complications: None Recommendations:  - BID - TID sterile saline flushes - Record output daily - Do not submerge   - Routine drain care   Signed,  Dulcy Fanny. Earleen Newport, DO

## 2016-03-13 NOTE — Progress Notes (Addendum)
Call from MRI regarding clarification of pt neccesity for second MRI in less than 24 hrs. MRI request clarifying phone call in the am from MD, as they will not be able to perform this evening. Dorthey Sawyer, RN

## 2016-03-13 NOTE — Progress Notes (Signed)
PROGRESS NOTE        PATIENT DETAILS Name: Dorothy White Age: 65 y.o. Sex: female Date of Birth: [birth year] Admit Date: 03/12/2016 Admitting Physician Karmen Bongo, MD [id number], [name], MD  Brief Narrative: Patient is a 65 y.o. female with history of nonobstructive CAD ([facility] July 2017 showed 25% stenosis at the mid LAD) GERD, depression who presented for evaluation of left lower abdominal pain. Further evaluation revealed sigmoid diverticulitis with a abscess. Gen. surgery was consulted, subsequently underwent CT-guided placement of a pigtail drain on 8/17. On empiric levofloxacin and Flagyl . See below for further details  Subjective: Continues to have some tenderness in the left lower quadrant area. No other complaints.  Assessment/Plan: Sigmoid diverticulitis with paracolic abscess: Continue empiric Levaquin and Flagyl. Underwent a CT-guided pigtail drain placement on 8/70. Gen. surgery and IR following.  Nonobstructive CAD: Recent LHC July 2017 showed 25% stenosis at the mid LAD is an, stable without any chest pain. Continue aspirin.  ?Pancreatic lesion: was seen on CT scan, awaiting MRI of the abdomen.  Anxiety/depression: Stable, continue with BuSpar, Prozac and trazodone.   Hypokalemia: Repleted, recheck electrolytes periodically  DVT Prophylaxis: SCD's  Code Status: Full code  Family Communication: None at bedside-patient is awake and alert, understanding of the above noted plan  Disposition Plan: Remain home when cleared by general surgery   Antimicrobial agents: IV Levaquin 8/16>> IV Flagyl 8/17>>  Procedures: CT-guided placement of a pigtail drain in the paracolonic abscess on 8/17  CONSULTS:  general surgery and Interventional radiology  Time spent: 25 minutes-Greater than 50% of this time was spent in counseling, explanation of diagnosis, planning of further management, and coordination of  care.  MEDICATIONS: Anti-infectives    Start     Dose/Rate Route Frequency Ordered Stop   03/13/16 2200  levofloxacin (LEVAQUIN) IVPB 750 mg     750 mg 100 mL/hr over 90 Minutes Intravenous Every 24 hours 03/12/16 2316     03/13/16 0800  metroNIDAZOLE (FLAGYL) IVPB 500 mg     500 mg 100 mL/hr over 60 Minutes Intravenous Every 8 hours 03/13/16 0737     03/12/16 2130  metroNIDAZOLE (FLAGYL) IVPB 500 mg     500 mg 100 mL/hr over 60 Minutes Intravenous  Once 03/12/16 2115 03/12/16 2228   03/12/16 2115  levofloxacin (LEVAQUIN) IVPB 750 mg     750 mg 100 mL/hr over 90 Minutes Intravenous  Once 03/12/16 2115 03/12/16 2320      Scheduled Meds: . aspirin EC  81 mg Oral QODAY  . busPIRone  5 mg Oral TID  . feeding supplement (ENSURE ENLIVE)  237 mL Oral BID BM  . fentaNYL      . FLUoxetine  60 mg Oral Daily  . levofloxacin (LEVAQUIN) IV  750 mg Intravenous Q24H  . lidocaine      . metronidazole  500 mg Intravenous Q8H  . midazolam      . traZODone  200 mg Oral QHS   Continuous Infusions: . lactated ringers 100 mL/hr at 03/13/16 1103   PRN Meds:.acetaminophen **OR** acetaminophen, clonazePAM, HYDROmorphone (DILAUDID) injection, ondansetron **OR** ondansetron (ZOFRAN) IV   PHYSICAL EXAM: Vital signs: Vitals:   03/13/16 1347 03/13/16 1348 03/13/16 1351 03/13/16 1356  BP: (!) 93/45 (!) 91/36 (!) 100/36 (!) 95/37  Pulse: 76 75 73 75  Resp: 15 14 16  18  Temp:      TempSrc:      SpO2: 98% 96% 97% 95%  Weight:      Height:       Filed Weights   03/12/16 2321  Weight: 59 kg (130 lb)   Body mass index is 21.63 kg/m.   Gen Exam: Awake and alert with clear speech. Not in any distress  Neck: Supple, No JVD.   Chest: B/L Clear.   CVS: S1 S2 Regular, no murmurs.  Abdomen: soft, BS +, Mildly tender in the left lower quadrant area, non distended.  Extremities: no edema, lower extremities warm to touch. Neurologic: Non Focal.  Skin: No Rash or lesions   Wounds: N/A.    I have  personally reviewed following labs and imaging studies  LABORATORY DATA: CBC:  Recent Labs Lab 03/12/16 1321 03/13/16 0451  WBC 10.2 8.2  HGB 12.0 10.5*  HCT 38.0 34.0*  MCV 90.9 90.9  PLT 307 [id number]    Basic Metabolic Panel:  Recent Labs Lab 03/12/16 1321 03/13/16 0451  NA 139 140  K 3.3* 3.6  CL 105 107  CO2 26 27  GLUCOSE 104* 95  BUN 7 <5*  CREATININE 0.71 0.63  CALCIUM 8.9 8.6*    GFR: Estimated Creatinine Clearance: 63.9 mL/min (by C-G formula based on SCr of 0.8 mg/dL).  Liver Function Tests:  Recent Labs Lab 03/12/16 1321  AST 11*  ALT 13*  ALKPHOS 77  BILITOT 0.7  PROT 6.2*  ALBUMIN 3.1*    Recent Labs Lab 03/12/16 1321  LIPASE 16   No results for input(s): AMMONIA in the last 168 hours.  Coagulation Profile:  Recent Labs Lab 03/13/16 0651  INR 1.23    Cardiac Enzymes: No results for input(s): CKTOTAL, CKMB, CKMBINDEX, TROPONINI in the last 168 hours.  BNP (last 3 results) No results for input(s): PROBNP in the last 8760 hours.  HbA1C: No results for input(s): HGBA1C in the last 72 hours.  CBG: No results for input(s): GLUCAP in the last 168 hours.  Lipid Profile: No results for input(s): CHOL, HDL, LDLCALC, TRIG, CHOLHDL, LDLDIRECT in the last 72 hours.  Thyroid Function Tests: No results for input(s): TSH, T4TOTAL, FREET4, T3FREE, THYROIDAB in the last 72 hours.  Anemia Panel: No results for input(s): VITAMINB12, FOLATE, FERRITIN, TIBC, IRON, RETICCTPCT in the last 72 hours.  Urine analysis:    Component Value Date/Time   COLORURINE YELLOW 03/12/2016 1819   APPEARANCEUR CLEAR 03/12/2016 1819   LABSPEC 1.026 03/12/2016 1819   PHURINE 6.0 03/12/2016 1819   GLUCOSEU NEGATIVE 03/12/2016 1819   GLUCOSEU NEGATIVE 04/13/2007 1417   HGBUR NEGATIVE 03/12/2016 1819   BILIRUBINUR SMALL (A) 03/12/2016 1819   KETONESUR NEGATIVE 03/12/2016 1819   PROTEINUR 30 (A) 03/12/2016 1819   UROBILINOGEN 0.2 02/04/2015 1800   NITRITE  NEGATIVE 03/12/2016 1819   LEUKOCYTESUR NEGATIVE 03/12/2016 1819    Sepsis Labs: Lactic Acid, Venous No results found for: LATICACIDVEN  MICROBIOLOGY: No results found for this or any previous visit (from the past 240 hour(s)).  RADIOLOGY STUDIES/RESULTS: Mr Abdomen W Wo Contrast  Result Date: 03/13/2016 CLINICAL DATA:  CT demonstrating a possible pancreatic head lesion. EXAM: MRI ABDOMEN WITHOUT AND WITH CONTRAST TECHNIQUE: Multiplanar multisequence MR imaging of the abdomen was performed both before and after the administration of intravenous contrast. CONTRAST:  30mL MULTIHANCE GADOBENATE DIMEGLUMINE 529 MG/ML IV SOLN COMPARISON:  03/12/2016 CT FINDINGS: Lower chest: Bilateral breast implants. Bibasilar atelectasis. Normal heart size without pericardial or pleural effusion.  Hepatobiliary: No focal liver lesion. Hepatomegaly at 21.4 cm. There is minimal left anterior hepatic lobe intrahepatic duct dilatation, of indeterminate etiology. Example image 17/ series 12003. Normal gallbladder, without biliary ductal dilatation. Pancreas: No pancreatic duct dilatation. There is a variant of pancreas divisum, with a prominent dorsal duct entering the duodenum on image 26/ series 3. Communication with the common duct is identified. The CT abnormality was likely secondary to the focal fat deposition within the pancreas. Example image 51/series 9. No acute pancreatitis. Spleen: Normal in size, without focal abnormality. Adrenals/Urinary Tract: Normal adrenal glands. Bilateral renal cysts. No hydronephrosis. Stomach/Bowel: Normal stomach and abdominal bowel loops. Vascular/Lymphatic: Normal caliber of the aorta and branch vessels. No retroperitoneal or retrocrural adenopathy. Other: No ascites. Musculoskeletal: No acute osseous abnormality. IMPRESSION: 1. The CT abnormality represents focal fat deposition within the pancreatic head. No suspicious pancreatic lesion. 2.  No acute abdominal process. 3. Variant of  pancreas divisum.  No acute pancreatitis. Electronically Signed   By: Abigail Miyamoto M.D.   On: 03/13/2016 08:51   Ct Abdomen Pelvis W Contrast  Result Date: 03/12/2016 CLINICAL DATA:  Left lower quadrant pain for 1 week with nausea vomiting and diarrhea. Fever. EXAM: CT ABDOMEN AND PELVIS WITH CONTRAST TECHNIQUE: Multidetector CT imaging of the abdomen and pelvis was performed using the standard protocol following bolus administration of intravenous contrast. CONTRAST:  1110mL ISOVUE-300 IOPAMIDOL (ISOVUE-300) INJECTION 61% COMPARISON:  10/28/2013 FINDINGS: Lower chest: Dependent atelectasis seen in the lower lobes bilaterally. Hepatobiliary: No focal abnormality within the liver parenchyma. There is no evidence for gallstones, gallbladder wall thickening, or pericholecystic fluid. No intrahepatic or extrahepatic biliary dilation. Pancreas: Ill-defined hypo enhancing soft tissue in the anterior pancreatic head is associated with some mild distention of the main pancreatic duct in the same region. Spleen: No splenomegaly. No focal mass lesion. Adrenals/Urinary Tract: No adrenal nodule or mass. Early excretion of contrast noted in the collecting systems bilaterally. Tiny cysts identified upper pole left kidney. No enhancing lesion identified in either kidney. No evidence for hydroureter. Mild circumferential bladder wall thickening appears slightly out of proportion to the degree of decompression. Stomach/Bowel: Stomach is nondistended. No gastric wall thickening. No evidence of outlet obstruction. Duodenum is normally positioned as is the ligament of Treitz. No small bowel wall thickening. No small bowel dilatation. The terminal ileum is normal. The appendix is not visualized, but there is no edema or inflammation in the region of the cecum. Diverticular changes are noted in the left colon. In the proximal sigmoid segment, there is colonic wall thickening with pericolonic edema/inflammation and a focal relatively  thick walled collection of fluid and gas measuring 4.2 x 3.2 x 5.3 cm. Vascular/Lymphatic: No abdominal aortic aneurysm. There is no gastrohepatic or hepatoduodenal ligament lymphadenopathy. No intraperitoneal or retroperitoneal lymphadenopathy. No pelvic sidewall lymphadenopathy. Reproductive: Uterus is surgically absent. There is no adnexal mass. Other: Trace intraperitoneal free fluid noted in the cul-de-sac. Musculoskeletal: Bone windows reveal no worrisome lytic or sclerotic osseous lesions. IMPRESSION: 1. Proximal sigmoid diverticulitis with contained para colonic abscess as above. This is contiguous with the urinary bladder which may account for the mild circumferential bladder wall thickening. 2. Ill-defined low-attenuation in the head of the pancreas with dilatation of the main pancreatic duct. Adenocarcinoma cannot be excluded. MRI of the abdomen without and with contrast is recommended to further evaluate. 3. Trace free fluid in the cul-de-sac. Electronically Signed   By: Misty Stanley M.D.   On: 03/12/2016 20:32   Mr 3d Recon  At Scanner  Result Date: 03/13/2016 CLINICAL DATA:  CT demonstrating a possible pancreatic head lesion. EXAM: MRI ABDOMEN WITHOUT AND WITH CONTRAST TECHNIQUE: Multiplanar multisequence MR imaging of the abdomen was performed both before and after the administration of intravenous contrast. CONTRAST:  84mL MULTIHANCE GADOBENATE DIMEGLUMINE 529 MG/ML IV SOLN COMPARISON:  03/12/2016 CT FINDINGS: Lower chest: Bilateral breast implants. Bibasilar atelectasis. Normal heart size without pericardial or pleural effusion. Hepatobiliary: No focal liver lesion. Hepatomegaly at 21.4 cm. There is minimal left anterior hepatic lobe intrahepatic duct dilatation, of indeterminate etiology. Example image 17/ series 12003. Normal gallbladder, without biliary ductal dilatation. Pancreas: No pancreatic duct dilatation. There is a variant of pancreas divisum, with a prominent dorsal duct entering the  duodenum on image 26/ series 3. Communication with the common duct is identified. The CT abnormality was likely secondary to the focal fat deposition within the pancreas. Example image 51/series 9. No acute pancreatitis. Spleen: Normal in size, without focal abnormality. Adrenals/Urinary Tract: Normal adrenal glands. Bilateral renal cysts. No hydronephrosis. Stomach/Bowel: Normal stomach and abdominal bowel loops. Vascular/Lymphatic: Normal caliber of the aorta and branch vessels. No retroperitoneal or retrocrural adenopathy. Other: No ascites. Musculoskeletal: No acute osseous abnormality. IMPRESSION: 1. The CT abnormality represents focal fat deposition within the pancreatic head. No suspicious pancreatic lesion. 2.  No acute abdominal process. 3. Variant of pancreas divisum.  No acute pancreatitis. Electronically Signed   By: Abigail Miyamoto M.D.   On: 03/13/2016 08:51     LOS: 1 day   Oren Binet, MD  [facility] Pager:[phone]  If 7PM-7AM, please contact night-coverage [email or web address] Password [facility] 03/13/2016, 2:56 PM

## 2016-03-13 NOTE — Progress Notes (Signed)
[facility] Progress Note     Subjective: IR placed perc drain today. Patient reports persistent abdominal pain, mildly improved from presentation yesterday. Denies fever/chills. No BMs today. Requesting ice.   Objective: Vital signs in last 24 hours: Temp:  [98.1 F (36.7 C)-99.5 F (37.5 C)] 98.7 F (37.1 C) (08/17 0818) Pulse Rate:  [65-83] 83 (08/17 0818) Resp:  [14-20] 17 (08/17 0818) BP: (97-134)/(41-76) 99/41 (08/17 0818) SpO2:  [94 %-99 %] 96 % (08/17 0818) Weight:  [59 kg (130 lb)] 59 kg (130 lb) (08/16 2321)    Intake/Output from previous day: 08/16 0701 - 08/17 0700 In: 751.7 [I.V.:651.7; IV Piggyback:100] Out: -  Intake/Output this shift: No intake/output data recorded.  PE: Gen:  Alert, NAD, pleasant Card:  RRR, no M/G/R, pedal pulses palpable Pulm:  unlabored, CTA, no W/R/R,  Abd: Soft, TTP LLQ, ND, +BS Ext:  No erythema, edema, or tenderness  Lab Results:   Recent Labs  03/12/16 1321 03/13/16 0451  WBC 10.2 8.2  HGB 12.0 10.5*  HCT 38.0 34.0*  PLT 307 247   BMET  Recent Labs  03/12/16 1321 03/13/16 0451  NA 139 140  K 3.3* 3.6  CL 105 107  CO2 26 27  GLUCOSE 104* 95  BUN 7 <5*  CREATININE 0.71 0.63  CALCIUM 8.9 8.6*   PT/INR  Recent Labs  03/13/16 0651  LABPROT 15.6*  INR 1.23   CMP     Component Value Date/Time   NA 140 03/13/2016 0451   K 3.6 03/13/2016 0451   CL 107 03/13/2016 0451   CO2 27 03/13/2016 0451   GLUCOSE 95 03/13/2016 0451   BUN <5 (L) 03/13/2016 0451   CREATININE 0.63 03/13/2016 0451   CREATININE 0.95 02/15/2016 1536   CALCIUM 8.6 (L) 03/13/2016 0451   CALCIUM 9.4 01/13/2007 2356   PROT 6.2 (L) 03/12/2016 1321   ALBUMIN 3.1 (L) 03/12/2016 1321   AST 11 (L) 03/12/2016 1321   ALT 13 (L) 03/12/2016 1321   ALKPHOS 77 03/12/2016 1321   BILITOT 0.7 03/12/2016 1321   GFRNONAA >60 03/13/2016 0451   GFRAA >60 03/13/2016 0451   Lipase     Component Value Date/Time   LIPASE 16 03/12/2016 1321    Studies/Results: Mr Abdomen W Wo Contrast  Result Date: 03/13/2016 CLINICAL DATA:  CT demonstrating a possible pancreatic head lesion. EXAM: MRI ABDOMEN WITHOUT AND WITH CONTRAST TECHNIQUE: Multiplanar multisequence MR imaging of the abdomen was performed both before and after the administration of intravenous contrast. CONTRAST:  66mL MULTIHANCE GADOBENATE DIMEGLUMINE 529 MG/ML IV SOLN COMPARISON:  03/12/2016 CT FINDINGS: Lower chest: Bilateral breast implants. Bibasilar atelectasis. Normal heart size without pericardial or pleural effusion. Hepatobiliary: No focal liver lesion. Hepatomegaly at 21.4 cm. There is minimal left anterior hepatic lobe intrahepatic duct dilatation, of indeterminate etiology. Example image 17/ series 12003. Normal gallbladder, without biliary ductal dilatation. Pancreas: No pancreatic duct dilatation. There is a variant of pancreas divisum, with a prominent dorsal duct entering the duodenum on image 26/ series 3. Communication with the common duct is identified. The CT abnormality was likely secondary to the focal fat deposition within the pancreas. Example image 51/series 9. No acute pancreatitis. Spleen: Normal in size, without focal abnormality. Adrenals/Urinary Tract: Normal adrenal glands. Bilateral renal cysts. No hydronephrosis. Stomach/Bowel: Normal stomach and abdominal bowel loops. Vascular/Lymphatic: Normal caliber of the aorta and branch vessels. No retroperitoneal or retrocrural adenopathy. Other: No ascites. Musculoskeletal: No acute osseous abnormality. IMPRESSION: 1. The CT abnormality represents  focal fat deposition within the pancreatic head. No suspicious pancreatic lesion. 2.  No acute abdominal process. 3. Variant of pancreas divisum.  No acute pancreatitis. Electronically Signed   By: Abigail Miyamoto M.D.   On: 03/13/2016 08:51   Ct Abdomen Pelvis W Contrast  Result Date: 03/12/2016 CLINICAL DATA:  Left lower quadrant pain for 1 week with nausea vomiting and  diarrhea. Fever. EXAM: CT ABDOMEN AND PELVIS WITH CONTRAST TECHNIQUE: Multidetector CT imaging of the abdomen and pelvis was performed using the standard protocol following bolus administration of intravenous contrast. CONTRAST:  177mL ISOVUE-300 IOPAMIDOL (ISOVUE-300) INJECTION 61% COMPARISON:  10/28/2013 FINDINGS: Lower chest: Dependent atelectasis seen in the lower lobes bilaterally. Hepatobiliary: No focal abnormality within the liver parenchyma. There is no evidence for gallstones, gallbladder wall thickening, or pericholecystic fluid. No intrahepatic or extrahepatic biliary dilation. Pancreas: Ill-defined hypo enhancing soft tissue in the anterior pancreatic head is associated with some mild distention of the main pancreatic duct in the same region. Spleen: No splenomegaly. No focal mass lesion. Adrenals/Urinary Tract: No adrenal nodule or mass. Early excretion of contrast noted in the collecting systems bilaterally. Tiny cysts identified upper pole left kidney. No enhancing lesion identified in either kidney. No evidence for hydroureter. Mild circumferential bladder wall thickening appears slightly out of proportion to the degree of decompression. Stomach/Bowel: Stomach is nondistended. No gastric wall thickening. No evidence of outlet obstruction. Duodenum is normally positioned as is the ligament of Treitz. No small bowel wall thickening. No small bowel dilatation. The terminal ileum is normal. The appendix is not visualized, but there is no edema or inflammation in the region of the cecum. Diverticular changes are noted in the left colon. In the proximal sigmoid segment, there is colonic wall thickening with pericolonic edema/inflammation and a focal relatively thick walled collection of fluid and gas measuring 4.2 x 3.2 x 5.3 cm. Vascular/Lymphatic: No abdominal aortic aneurysm. There is no gastrohepatic or hepatoduodenal ligament lymphadenopathy. No intraperitoneal or retroperitoneal lymphadenopathy. No  pelvic sidewall lymphadenopathy. Reproductive: Uterus is surgically absent. There is no adnexal mass. Other: Trace intraperitoneal free fluid noted in the cul-de-sac. Musculoskeletal: Bone windows reveal no worrisome lytic or sclerotic osseous lesions. IMPRESSION: 1. Proximal sigmoid diverticulitis with contained para colonic abscess as above. This is contiguous with the urinary bladder which may account for the mild circumferential bladder wall thickening. 2. Ill-defined low-attenuation in the head of the pancreas with dilatation of the main pancreatic duct. Adenocarcinoma cannot be excluded. MRI of the abdomen without and with contrast is recommended to further evaluate. 3. Trace free fluid in the cul-de-sac. Electronically Signed   By: Misty Stanley M.D.   On: 03/12/2016 20:32   Mr 3d Recon At Scanner  Result Date: 03/13/2016 CLINICAL DATA:  CT demonstrating a possible pancreatic head lesion. EXAM: MRI ABDOMEN WITHOUT AND WITH CONTRAST TECHNIQUE: Multiplanar multisequence MR imaging of the abdomen was performed both before and after the administration of intravenous contrast. CONTRAST:  28mL MULTIHANCE GADOBENATE DIMEGLUMINE 529 MG/ML IV SOLN COMPARISON:  03/12/2016 CT FINDINGS: Lower chest: Bilateral breast implants. Bibasilar atelectasis. Normal heart size without pericardial or pleural effusion. Hepatobiliary: No focal liver lesion. Hepatomegaly at 21.4 cm. There is minimal left anterior hepatic lobe intrahepatic duct dilatation, of indeterminate etiology. Example image 17/ series 12003. Normal gallbladder, without biliary ductal dilatation. Pancreas: No pancreatic duct dilatation. There is a variant of pancreas divisum, with a prominent dorsal duct entering the duodenum on image 26/ series 3. Communication with the common duct is identified. The  CT abnormality was likely secondary to the focal fat deposition within the pancreas. Example image 51/series 9. No acute pancreatitis. Spleen: Normal in size,  without focal abnormality. Adrenals/Urinary Tract: Normal adrenal glands. Bilateral renal cysts. No hydronephrosis. Stomach/Bowel: Normal stomach and abdominal bowel loops. Vascular/Lymphatic: Normal caliber of the aorta and branch vessels. No retroperitoneal or retrocrural adenopathy. Other: No ascites. Musculoskeletal: No acute osseous abnormality. IMPRESSION: 1. The CT abnormality represents focal fat deposition within the pancreatic head. No suspicious pancreatic lesion. 2.  No acute abdominal process. 3. Variant of pancreas divisum.  No acute pancreatitis. Electronically Signed   By: Abigail Miyamoto M.D.   On: 03/13/2016 08:51    Anti-infectives: Anti-infectives    Start     Dose/Rate Route Frequency Ordered Stop   03/13/16 2200  levofloxacin (LEVAQUIN) IVPB 750 mg     750 mg 100 mL/hr over 90 Minutes Intravenous Every 24 hours 03/12/16 2316     03/13/16 0800  metroNIDAZOLE (FLAGYL) IVPB 500 mg     500 mg 100 mL/hr over 60 Minutes Intravenous Every 8 hours 03/13/16 0737     03/12/16 2130  metroNIDAZOLE (FLAGYL) IVPB 500 mg     500 mg 100 mL/hr over 60 Minutes Intravenous  Once 03/12/16 2115 03/12/16 2228   03/12/16 2115  levofloxacin (LEVAQUIN) IVPB 750 mg     750 mg 100 mL/hr over 90 Minutes Intravenous  Once 03/12/16 2115 03/12/16 2320     Assessment/Plan Sigmoid diverticulitis with abscess - IR placed 26F pigtail drain today with 10-15 cc purulent fluid aspirated.  - WBC 8.2 this AM  Pancreatic lesion - found incidentally on CT, MRI pending CAD - stable; LHC 01/2016 25% stenosis of LAD; ASA  H/o CVA GERD Depression - BuSpar, Prozac, trazodone at home  FEN: NPO, IVF ID: Levaquin, Flagyl  Plan: IV abx, monitor drain output, abdominal exams  Possibly start clears tomorrow if abdominal tenderness improves   LOS: 1 day    Jill Alexanders , [facility] 03/13/2016, 10:23 AM Pager: [phone] Consults: [phone] Mon-Fri 7:00 am-4:30 pm Sat-Sun 7:00  am-11:30 am

## 2016-03-13 NOTE — Sedation Documentation (Signed)
Patient is resting comfortably. 

## 2016-03-13 NOTE — Sedation Documentation (Signed)
Light brown drainage aspirated  Before placing drain

## 2016-03-13 NOTE — Progress Notes (Signed)
New Admission Note:  Arrival Method: Via wheelchair with nurse Tech Mental Orientation: Alert and oriented x4 Telemetry: n/a Assessment: Completed Skin: dry and intact IV: right wrist  Pain: MD notified Tubes: n/a Safety Measures: Safety Fall Prevention Plan discussed. Admission: Completed [address] Orientation: Patient has been orientated to the room, unit and the staff. Family: none at bedside  Orders have been reviewed and implemented. Will continue to monitor the patient. Call light has been placed within reach and bed alarm has been activated.   Leandro Reasoner BSN, RN  Phone Number: [phone] [facility]

## 2016-03-13 NOTE — Progress Notes (Signed)
03/13/2016 3:12 PM  Patient complaining of surgical pain and requesting to eat. States that if she ate then she would probably not be in so much pain. Will inform MD. Will inform attending RN. Check eMAR.  [employer], RN-BC, [employer] [facility] Phone [phone]

## 2016-03-14 MED ORDER — KETOROLAC TROMETHAMINE 30 MG/ML IJ SOLN: 30.0000 mg | Freq: Three times a day (TID) | INTRAMUSCULAR | Status: DC | PRN

## 2016-03-14 MED ORDER — OXYCODONE-ACETAMINOPHEN 5-325 MG PO TABS
1.0000 | ORAL_TABLET | Freq: Four times a day (QID) | ORAL | Status: DC | PRN
Start: 2016-03-14 — End: 2016-03-19
  Administered 2016-03-14 – 2016-03-19 (×11): 1 via ORAL
  Filled 2016-03-14 (×12): qty 1

## 2016-03-14 MED ORDER — METOCLOPRAMIDE HCL 5 MG/ML IJ SOLN
10.0000 mg | Freq: Three times a day (TID) | INTRAMUSCULAR | Status: DC | PRN
  Administered 2016-03-15: 10 mg via INTRAVENOUS
  Filled 2016-03-14: qty 2

## 2016-03-14 MED ORDER — OXYCODONE-ACETAMINOPHEN 10-325 MG PO TABS: 1.0000 | ORAL_TABLET | Freq: Four times a day (QID) | ORAL | Status: DC | PRN

## 2016-03-14 MED ORDER — OXYCODONE HCL 5 MG PO TABS
5.0000 mg | ORAL_TABLET | Freq: Four times a day (QID) | ORAL | Status: DC | PRN
  Administered 2016-03-14 – 2016-03-19 (×11): 5 mg via ORAL
  Filled 2016-03-14 (×12): qty 1

## 2016-03-14 MED ORDER — SODIUM CHLORIDE 0.9 % IV SOLN
25.0000 mg | Freq: Three times a day (TID) | INTRAVENOUS | Status: DC | PRN
  Filled 2016-03-14: qty 0.5

## 2016-03-14 NOTE — Progress Notes (Signed)
PROGRESS NOTE        PATIENT DETAILS Name: Dorothy White Age: 65 y.o. Sex: female Date of Birth: 1950-11-05 Admit Date: 03/12/2016 Admitting Physician Karmen Bongo, MD [id number], [name], MD  Brief Narrative: Patient is a 65 y.o. female with history of nonobstructive CAD ([place] July 2017 showed 25% stenosis at the mid LAD) GERD, depression who presented for evaluation of left lower abdominal pain. Further evaluation revealed sigmoid diverticulitis with a abscess. Gen. surgery was consulted, subsequently underwent CT-guided placement of a pigtail drain on 8/17. On empiric levofloxacin and Flagyl . See below for further details  Subjective: Continues to have some tenderness in the left lower quadrant area. No other complaints.  Assessment/Plan: Sigmoid diverticulitis with paracolic abscess: Continue empiric Levaquin and Flagyl. Underwent a CT-guided pigtail drain placement on 8/17. Started on clear liquids on 8/18-Gen. surgery and IR following.  Nonobstructive CAD: Recent [place] July 2017 showed 25% stenosis at the mid LAD is an, stable without any chest pain. Continue aspirin.  ?Pancreatic lesion seen on CT scan: MRI of the abdomen shows focal fat deposition within the pancreatic head without any suspicious pancreatic lesions-no further workup required at this time.   Anxiety/depression: Stable, continue with BuSpar, Prozac and trazodone.   Hypokalemia: Repleted, recheck electrolytes periodically  DVT Prophylaxis: SCD's  Code Status: Full code  Family Communication: None at bedside-patient is awake and alert, understanding of the above noted plan  Disposition Plan: Remain home when cleared by general surgery   Antimicrobial agents: IV Levaquin 8/16>> IV Flagyl 8/17>>  Procedures: CT-guided placement of a pigtail drain in the paracolonic abscess on 8/17  CONSULTS:  general surgery and Interventional radiology  Time spent: 25 minutes-Greater  than 50% of this time was spent in counseling, explanation of diagnosis, planning of further management, and coordination of care.  MEDICATIONS: Anti-infectives    Start     Dose/Rate Route Frequency Ordered Stop   03/13/16 2200  levofloxacin (LEVAQUIN) IVPB 750 mg     750 mg 100 mL/hr over 90 Minutes Intravenous Every 24 hours 03/12/16 2316     03/13/16 0800  metroNIDAZOLE (FLAGYL) IVPB 500 mg     500 mg 100 mL/hr over 60 Minutes Intravenous Every 8 hours 03/13/16 0737     03/12/16 2130  metroNIDAZOLE (FLAGYL) IVPB 500 mg     500 mg 100 mL/hr over 60 Minutes Intravenous  Once 03/12/16 2115 03/12/16 2228   03/12/16 2115  levofloxacin (LEVAQUIN) IVPB 750 mg     750 mg 100 mL/hr over 90 Minutes Intravenous  Once 03/12/16 2115 03/12/16 2320      Scheduled Meds: . aspirin EC  81 mg Oral QODAY  . busPIRone  5 mg Oral TID  . feeding supplement (ENSURE ENLIVE)  237 mL Oral BID BM  . FLUoxetine  60 mg Oral Daily  . levofloxacin (LEVAQUIN) IV  750 mg Intravenous Q24H  . metronidazole  500 mg Intravenous Q8H  . traZODone  200 mg Oral QHS   Continuous Infusions: . lactated ringers 100 mL/hr at 03/13/16 1927   PRN Meds:.acetaminophen **OR** acetaminophen, antiseptic oral rinse, clonazePAM, HYDROmorphone (DILAUDID) injection, ondansetron **OR** ondansetron (ZOFRAN) IV   PHYSICAL EXAM: Vital signs: Vitals:   03/13/16 1632 03/13/16 2118 03/14/16 0559 03/14/16 0815  BP: (!) 127/33 (!) 111/50 (!) 108/52 (!) 108/52  Pulse: 82 80 84 79  Resp:  18 18 18 17   Temp: (!) 100.5 F (38.1 C) 99.1 F (37.3 C) 99.1 F (37.3 C) 98.6 F (37 C)  TempSrc: Oral Oral Oral Oral  SpO2: 95% 93% 94% 94%  Weight:  57.3 kg (126 lb 5.2 oz)    Height:       Filed Weights   03/12/16 2321 03/13/16 2118  Weight: 59 kg (130 lb) 57.3 kg (126 lb 5.2 oz)   Body mass index is 21.02 kg/m.   Gen Exam: Awake and alert with clear speech. Not in any distress  Neck: Supple, No JVD.   Chest: B/L Clear.   CVS: S1  S2 Regular, no murmurs.  Abdomen: soft, BS +, Mildly tender in the left lower quadrant area, non distended.  Extremities: no edema, lower extremities warm to touch. Neurologic: Non Focal.  Skin: No Rash or lesions   Wounds: N/A.    I have personally reviewed following labs and imaging studies  LABORATORY DATA: CBC:  Recent Labs Lab 03/12/16 1321 03/13/16 0451  WBC 10.2 8.2  HGB 12.0 10.5*  HCT 38.0 34.0*  MCV 90.9 90.9  PLT 307 [id number]    Basic Metabolic Panel:  Recent Labs Lab 03/12/16 1321 03/13/16 0451  NA 139 140  K 3.3* 3.6  CL 105 107  CO2 26 27  GLUCOSE 104* 95  BUN 7 <5*  CREATININE 0.71 0.63  CALCIUM 8.9 8.6*    GFR: Estimated Creatinine Clearance: 63.9 mL/min (by C-G formula based on SCr of 0.8 mg/dL).  Liver Function Tests:  Recent Labs Lab 03/12/16 1321  AST 11*  ALT 13*  ALKPHOS 77  BILITOT 0.7  PROT 6.2*  ALBUMIN 3.1*    Recent Labs Lab 03/12/16 1321  LIPASE 16   No results for input(s): AMMONIA in the last 168 hours.  Coagulation Profile:  Recent Labs Lab 03/13/16 0651  INR 1.23    Cardiac Enzymes: No results for input(s): CKTOTAL, CKMB, CKMBINDEX, TROPONINI in the last 168 hours.  BNP (last 3 results) No results for input(s): PROBNP in the last 8760 hours.  HbA1C: No results for input(s): HGBA1C in the last 72 hours.  CBG: No results for input(s): GLUCAP in the last 168 hours.  Lipid Profile: No results for input(s): CHOL, HDL, LDLCALC, TRIG, CHOLHDL, LDLDIRECT in the last 72 hours.  Thyroid Function Tests: No results for input(s): TSH, T4TOTAL, FREET4, T3FREE, THYROIDAB in the last 72 hours.  Anemia Panel: No results for input(s): VITAMINB12, FOLATE, FERRITIN, TIBC, IRON, RETICCTPCT in the last 72 hours.  Urine analysis:    Component Value Date/Time   COLORURINE YELLOW 03/12/2016 1819   APPEARANCEUR CLEAR 03/12/2016 1819   LABSPEC 1.026 03/12/2016 1819   PHURINE 6.0 03/12/2016 1819   GLUCOSEU NEGATIVE  03/12/2016 1819   GLUCOSEU NEGATIVE 04/13/2007 1417   HGBUR NEGATIVE 03/12/2016 1819   BILIRUBINUR SMALL (A) 03/12/2016 1819   KETONESUR NEGATIVE 03/12/2016 1819   PROTEINUR 30 (A) 03/12/2016 1819   UROBILINOGEN 0.2 02/04/2015 1800   NITRITE NEGATIVE 03/12/2016 1819   LEUKOCYTESUR NEGATIVE 03/12/2016 1819    Sepsis Labs: Lactic Acid, Venous No results found for: LATICACIDVEN  MICROBIOLOGY: Recent Results (from the past 240 hour(s))  Aerobic/Anaerobic Culture (surgical/deep wound)     Status: None (Preliminary result)   Collection Time: 03/13/16  2:44 PM  Result Value Ref Range Status   Specimen Description ABSCESS ABDOMEN  Final   Special Requests NONE  Final   Gram Stain   Final    ABUNDANT WBC  PRESENT, PREDOMINANTLY PMN MODERATE GRAM NEGATIVE RODS RARE GRAM POSITIVE COCCI IN PAIRS    Culture   Final    ABUNDANT ESCHERICHIA COLI SUSCEPTIBILITIES TO FOLLOW    Report Status PENDING  Incomplete    RADIOLOGY STUDIES/RESULTS: Mr Abdomen W Wo Contrast  Result Date: 03/13/2016 CLINICAL DATA:  CT demonstrating a possible pancreatic head lesion. EXAM: MRI ABDOMEN WITHOUT AND WITH CONTRAST TECHNIQUE: Multiplanar multisequence MR imaging of the abdomen was performed both before and after the administration of intravenous contrast. CONTRAST:  13mL MULTIHANCE GADOBENATE DIMEGLUMINE 529 MG/ML IV SOLN COMPARISON:  03/12/2016 CT FINDINGS: Lower chest: Bilateral breast implants. Bibasilar atelectasis. Normal heart size without pericardial or pleural effusion. Hepatobiliary: No focal liver lesion. Hepatomegaly at 21.4 cm. There is minimal left anterior hepatic lobe intrahepatic duct dilatation, of indeterminate etiology. Example image 17/ series 12003. Normal gallbladder, without biliary ductal dilatation. Pancreas: No pancreatic duct dilatation. There is a variant of pancreas divisum, with a prominent dorsal duct entering the duodenum on image 26/ series 3. Communication with the common duct is  identified. The CT abnormality was likely secondary to the focal fat deposition within the pancreas. Example image 51/series 9. No acute pancreatitis. Spleen: Normal in size, without focal abnormality. Adrenals/Urinary Tract: Normal adrenal glands. Bilateral renal cysts. No hydronephrosis. Stomach/Bowel: Normal stomach and abdominal bowel loops. Vascular/Lymphatic: Normal caliber of the aorta and branch vessels. No retroperitoneal or retrocrural adenopathy. Other: No ascites. Musculoskeletal: No acute osseous abnormality. IMPRESSION: 1. The CT abnormality represents focal fat deposition within the pancreatic head. No suspicious pancreatic lesion. 2.  No acute abdominal process. 3. Variant of pancreas divisum.  No acute pancreatitis. Electronically Signed   By: Abigail Miyamoto M.D.   On: 03/13/2016 08:51   Ct Abdomen Pelvis W Contrast  Result Date: 03/12/2016 CLINICAL DATA:  Left lower quadrant pain for 1 week with nausea vomiting and diarrhea. Fever. EXAM: CT ABDOMEN AND PELVIS WITH CONTRAST TECHNIQUE: Multidetector CT imaging of the abdomen and pelvis was performed using the standard protocol following bolus administration of intravenous contrast. CONTRAST:  159mL ISOVUE-300 IOPAMIDOL (ISOVUE-300) INJECTION 61% COMPARISON:  10/28/2013 FINDINGS: Lower chest: Dependent atelectasis seen in the lower lobes bilaterally. Hepatobiliary: No focal abnormality within the liver parenchyma. There is no evidence for gallstones, gallbladder wall thickening, or pericholecystic fluid. No intrahepatic or extrahepatic biliary dilation. Pancreas: Ill-defined hypo enhancing soft tissue in the anterior pancreatic head is associated with some mild distention of the main pancreatic duct in the same region. Spleen: No splenomegaly. No focal mass lesion. Adrenals/Urinary Tract: No adrenal nodule or mass. Early excretion of contrast noted in the collecting systems bilaterally. Tiny cysts identified upper pole left kidney. No enhancing  lesion identified in either kidney. No evidence for hydroureter. Mild circumferential bladder wall thickening appears slightly out of proportion to the degree of decompression. Stomach/Bowel: Stomach is nondistended. No gastric wall thickening. No evidence of outlet obstruction. Duodenum is normally positioned as is the ligament of Treitz. No small bowel wall thickening. No small bowel dilatation. The terminal ileum is normal. The appendix is not visualized, but there is no edema or inflammation in the region of the cecum. Diverticular changes are noted in the left colon. In the proximal sigmoid segment, there is colonic wall thickening with pericolonic edema/inflammation and a focal relatively thick walled collection of fluid and gas measuring 4.2 x 3.2 x 5.3 cm. Vascular/Lymphatic: No abdominal aortic aneurysm. There is no gastrohepatic or hepatoduodenal ligament lymphadenopathy. No intraperitoneal or retroperitoneal lymphadenopathy. No pelvic sidewall lymphadenopathy. Reproductive:  Uterus is surgically absent. There is no adnexal mass. Other: Trace intraperitoneal free fluid noted in the cul-de-sac. Musculoskeletal: Bone windows reveal no worrisome lytic or sclerotic osseous lesions. IMPRESSION: 1. Proximal sigmoid diverticulitis with contained para colonic abscess as above. This is contiguous with the urinary bladder which may account for the mild circumferential bladder wall thickening. 2. Ill-defined low-attenuation in the head of the pancreas with dilatation of the main pancreatic duct. Adenocarcinoma cannot be excluded. MRI of the abdomen without and with contrast is recommended to further evaluate. 3. Trace free fluid in the cul-de-sac. Electronically Signed   By: Misty Stanley M.D.   On: 03/12/2016 20:32   Mr 3d Recon At Scanner  Result Date: 03/13/2016 CLINICAL DATA:  CT demonstrating a possible pancreatic head lesion. EXAM: MRI ABDOMEN WITHOUT AND WITH CONTRAST TECHNIQUE: Multiplanar multisequence MR  imaging of the abdomen was performed both before and after the administration of intravenous contrast. CONTRAST:  20mL MULTIHANCE GADOBENATE DIMEGLUMINE 529 MG/ML IV SOLN COMPARISON:  03/12/2016 CT FINDINGS: Lower chest: Bilateral breast implants. Bibasilar atelectasis. Normal heart size without pericardial or pleural effusion. Hepatobiliary: No focal liver lesion. Hepatomegaly at 21.4 cm. There is minimal left anterior hepatic lobe intrahepatic duct dilatation, of indeterminate etiology. Example image 17/ series 12003. Normal gallbladder, without biliary ductal dilatation. Pancreas: No pancreatic duct dilatation. There is a variant of pancreas divisum, with a prominent dorsal duct entering the duodenum on image 26/ series 3. Communication with the common duct is identified. The CT abnormality was likely secondary to the focal fat deposition within the pancreas. Example image 51/series 9. No acute pancreatitis. Spleen: Normal in size, without focal abnormality. Adrenals/Urinary Tract: Normal adrenal glands. Bilateral renal cysts. No hydronephrosis. Stomach/Bowel: Normal stomach and abdominal bowel loops. Vascular/Lymphatic: Normal caliber of the aorta and branch vessels. No retroperitoneal or retrocrural adenopathy. Other: No ascites. Musculoskeletal: No acute osseous abnormality. IMPRESSION: 1. The CT abnormality represents focal fat deposition within the pancreatic head. No suspicious pancreatic lesion. 2.  No acute abdominal process. 3. Variant of pancreas divisum.  No acute pancreatitis. Electronically Signed   By: Abigail Miyamoto M.D.   On: 03/13/2016 08:51   Ct Image Guided Drainage By Percutaneous Catheter  Result Date: 03/13/2016 INDICATION: 65 year old female with a history of diverticular abscess EXAM: CT GUIDED DRAINAGE OF  ABSCESS MEDICATIONS: The patient is currently admitted to the hospital and receiving intravenous antibiotics. The antibiotics were administered within an appropriate time frame prior  to the initiation of the procedure. ANESTHESIA/SEDATION: 2.0 mg IV Versed 100 mcg IV Fentanyl Moderate Sedation Time:  10 The patient was continuously monitored during the procedure by the interventional radiology nurse under my direct supervision. COMPLICATIONS: None TECHNIQUE: Informed written consent was obtained from the patient after a thorough discussion of the procedural risks, benefits and alternatives. All questions were addressed. Maximal Sterile Barrier Technique was utilized including caps, mask, sterile gowns, sterile gloves, sterile drape, hand hygiene and skin antiseptic. A timeout was performed prior to the initiation of the procedure. PROCEDURE: Patient was positioned supine position on the CT gantry table. Scout CT was performed for planning purposes. Patient was then prepped and draped in the usual sterile fashion. The skin and subcutaneous tissues overlying the lower abdominal abscess collection was generously infiltrated 1% lidocaine for local anesthesia. Using CT guidance, a guide needle was advanced into the abscess. Once we confirmed location the needle, modified Seldinger technique was used to place a 12 French pigtail catheter drain. Approximately 10 cc - 15 cc of  purulent fluid was aspirated for a sample to be sent to the lab. Catheter was sutured in position attached to gravity drainage. Patient tolerated the procedure well and remained hemodynamically stable throughout. No complications were encountered and no significant blood loss encountered. FINDINGS: Fluid and gas collection overlying the sigmoid colon, similar to the prior CT study. Inflammatory changes present. Status post drain placement there is collapse of the gas collection. IMPRESSION: Status post CT-guided drainage of abdominal abscess secondary to diverticulitis. Twelve french drain attached to gravity drainage with sample sent to the lab for analysis. Signed, Dulcy Fanny. Earleen Newport, DO [facility] Interventional Radiology  [facility] Radiology Electronically Signed   By: Corrie Mckusick D.O.   On: 03/13/2016 18:25     LOS: 2 days   Oren Binet, MD  [facility] Pager:[phone]  If 7PM-7AM, please contact night-coverage [email or web address] Password [facility] 03/14/2016, 11:39 AM

## 2016-03-14 NOTE — Progress Notes (Signed)
Paged Dr. Nigel Bridgeman, pt states hx of migraines and had since been admitted. Dilaudid does not last long enough and wants something longer lasting maybe in pill form.

## 2016-03-14 NOTE — Progress Notes (Signed)
Referring Physician(s): Dr Oren Binet  Supervising Physician: Corrie Mckusick  Patient Status:  Inpatient  Chief Complaint:  LLQ abscess drain placed 8/17  Subjective:  No pain +headache Output minimal +ecoli  Allergies: Penicillins  Medications: Prior to Admission medications   Medication Sig Start Date End Date Taking? Authorizing Provider  Ascorbic Acid (VITAMIN C PO) Take 1 tablet by mouth every evening.   Yes Historical Provider, MD  aspirin EC 81 MG tablet Take 81 mg by mouth every other day.   Yes Historical Provider, MD  busPIRone (BUSPAR) 5 MG tablet Take 1 tablet by mouth 3 (three) times daily. 02/12/16  Yes Historical Provider, MD  calcium-vitamin D (OSCAL WITH D) 500-200 MG-UNIT per tablet Take 1 tablet by mouth daily with breakfast.   Yes Historical Provider, MD  Cholecalciferol (VITAMIN D PO) Take 1 capsule by mouth every evening.   Yes Historical Provider, MD  ciprofloxacin (CIPRO) 500 MG tablet Take 1 tablet by mouth 2 (two) times daily. 03/07/16  Yes Historical Provider, MD  clonazePAM (KLONOPIN) 1 MG tablet Take 1 tablet (1 mg total) by mouth daily as needed (anxiety/sleep.). Patient taking differently: Take 1 mg by mouth 2 (two) times daily as needed for anxiety (anxiety/sleep.).  02/15/16  Yes Dorothy Spark, MD  FLUoxetine (PROZAC) 20 MG capsule Take 60 mg by mouth daily.    Yes Historical Provider, MD  levocetirizine (XYZAL ALLERGY 24HR) 5 MG tablet Take 5 mg by mouth every evening.   Yes Historical Provider, MD  LUTEIN PO Take 1 capsule by mouth every evening.   Yes Historical Provider, MD  mometasone (NASONEX) 50 MCG/ACT nasal spray Place 2 sprays into the nose daily.   Yes Historical Provider, MD  Multiple Vitamins-Minerals (ONE-A-DAY WOMENS 50 PLUS PO) Take 1 tablet by mouth every evening.   Yes Historical Provider, MD  oxyCODONE-acetaminophen (PERCOCET) 10-325 MG tablet Take 1 tablet by mouth 4 (four) times daily as needed. 02/01/16  Yes  Historical Provider, MD  RELPAX 40 MG tablet Take 1 tablet by mouth as needed for migraine and may take 1 additional tablet in 2 hours if no relief 01/16/16  Yes Historical Provider, MD  traZODone (DESYREL) 100 MG tablet Take 200 mg by mouth at bedtime.    Yes Historical Provider, MD  vitamin E 100 UNIT capsule Take 100 Units by mouth every evening.   Yes Historical Provider, MD     Vital Signs: BP (!) 108/52 (BP Location: Right Arm)   Pulse 79   Temp 98.6 F (37 C) (Oral)   Resp 17   Ht 5\' 5"  (1.651 m)   Wt 126 lb 5.2 oz (57.3 kg)   SpO2 94%   BMI 21.02 kg/m   Physical Exam  Abdominal: Soft. Bowel sounds are normal.  Skin: Skin is warm and dry.  Skin site is clean and dry NT No bleeding Output serous color 30 cc in bag None recorded Flushes easily  +Ecoli  Imaging: Mr Abdomen W Wo Contrast  Result Date: 03/13/2016 CLINICAL DATA:  CT demonstrating a possible pancreatic head lesion. EXAM: MRI ABDOMEN WITHOUT AND WITH CONTRAST TECHNIQUE: Multiplanar multisequence MR imaging of the abdomen was performed both before and after the administration of intravenous contrast. CONTRAST:  43mL MULTIHANCE GADOBENATE DIMEGLUMINE 529 MG/ML IV SOLN COMPARISON:  03/12/2016 CT FINDINGS: Lower chest: Bilateral breast implants. Bibasilar atelectasis. Normal heart size without pericardial or pleural effusion. Hepatobiliary: No focal liver lesion. Hepatomegaly at 21.4 cm. There is minimal left anterior hepatic  lobe intrahepatic duct dilatation, of indeterminate etiology. Example image 17/ series 12003. Normal gallbladder, without biliary ductal dilatation. Pancreas: No pancreatic duct dilatation. There is a variant of pancreas divisum, with a prominent dorsal duct entering the duodenum on image 26/ series 3. Communication with the common duct is identified. The CT abnormality was likely secondary to the focal fat deposition within the pancreas. Example image 51/series 9. No acute pancreatitis. Spleen:  Normal in size, without focal abnormality. Adrenals/Urinary Tract: Normal adrenal glands. Bilateral renal cysts. No hydronephrosis. Stomach/Bowel: Normal stomach and abdominal bowel loops. Vascular/Lymphatic: Normal caliber of the aorta and branch vessels. No retroperitoneal or retrocrural adenopathy. Other: No ascites. Musculoskeletal: No acute osseous abnormality. IMPRESSION: 1. The CT abnormality represents focal fat deposition within the pancreatic head. No suspicious pancreatic lesion. 2.  No acute abdominal process. 3. Variant of pancreas divisum.  No acute pancreatitis. Electronically Signed   By: Abigail Miyamoto M.D.   On: 03/13/2016 08:51   Ct Abdomen Pelvis W Contrast  Result Date: 03/12/2016 CLINICAL DATA:  Left lower quadrant pain for 1 week with nausea vomiting and diarrhea. Fever. EXAM: CT ABDOMEN AND PELVIS WITH CONTRAST TECHNIQUE: Multidetector CT imaging of the abdomen and pelvis was performed using the standard protocol following bolus administration of intravenous contrast. CONTRAST:  131mL ISOVUE-300 IOPAMIDOL (ISOVUE-300) INJECTION 61% COMPARISON:  10/28/2013 FINDINGS: Lower chest: Dependent atelectasis seen in the lower lobes bilaterally. Hepatobiliary: No focal abnormality within the liver parenchyma. There is no evidence for gallstones, gallbladder wall thickening, or pericholecystic fluid. No intrahepatic or extrahepatic biliary dilation. Pancreas: Ill-defined hypo enhancing soft tissue in the anterior pancreatic head is associated with some mild distention of the main pancreatic duct in the same region. Spleen: No splenomegaly. No focal mass lesion. Adrenals/Urinary Tract: No adrenal nodule or mass. Early excretion of contrast noted in the collecting systems bilaterally. Tiny cysts identified upper pole left kidney. No enhancing lesion identified in either kidney. No evidence for hydroureter. Mild circumferential bladder wall thickening appears slightly out of proportion to the degree of  decompression. Stomach/Bowel: Stomach is nondistended. No gastric wall thickening. No evidence of outlet obstruction. Duodenum is normally positioned as is the ligament of Treitz. No small bowel wall thickening. No small bowel dilatation. The terminal ileum is normal. The appendix is not visualized, but there is no edema or inflammation in the region of the cecum. Diverticular changes are noted in the left colon. In the proximal sigmoid segment, there is colonic wall thickening with pericolonic edema/inflammation and a focal relatively thick walled collection of fluid and gas measuring 4.2 x 3.2 x 5.3 cm. Vascular/Lymphatic: No abdominal aortic aneurysm. There is no gastrohepatic or hepatoduodenal ligament lymphadenopathy. No intraperitoneal or retroperitoneal lymphadenopathy. No pelvic sidewall lymphadenopathy. Reproductive: Uterus is surgically absent. There is no adnexal mass. Other: Trace intraperitoneal free fluid noted in the cul-de-sac. Musculoskeletal: Bone windows reveal no worrisome lytic or sclerotic osseous lesions. IMPRESSION: 1. Proximal sigmoid diverticulitis with contained para colonic abscess as above. This is contiguous with the urinary bladder which may account for the mild circumferential bladder wall thickening. 2. Ill-defined low-attenuation in the head of the pancreas with dilatation of the main pancreatic duct. Adenocarcinoma cannot be excluded. MRI of the abdomen without and with contrast is recommended to further evaluate. 3. Trace free fluid in the cul-de-sac. Electronically Signed   By: Misty Stanley M.D.   On: 03/12/2016 20:32   Mr 3d Recon At Scanner  Result Date: 03/13/2016 CLINICAL DATA:  CT demonstrating a possible pancreatic head  lesion. EXAM: MRI ABDOMEN WITHOUT AND WITH CONTRAST TECHNIQUE: Multiplanar multisequence MR imaging of the abdomen was performed both before and after the administration of intravenous contrast. CONTRAST:  29mL MULTIHANCE GADOBENATE DIMEGLUMINE 529  MG/ML IV SOLN COMPARISON:  03/12/2016 CT FINDINGS: Lower chest: Bilateral breast implants. Bibasilar atelectasis. Normal heart size without pericardial or pleural effusion. Hepatobiliary: No focal liver lesion. Hepatomegaly at 21.4 cm. There is minimal left anterior hepatic lobe intrahepatic duct dilatation, of indeterminate etiology. Example image 17/ series 12003. Normal gallbladder, without biliary ductal dilatation. Pancreas: No pancreatic duct dilatation. There is a variant of pancreas divisum, with a prominent dorsal duct entering the duodenum on image 26/ series 3. Communication with the common duct is identified. The CT abnormality was likely secondary to the focal fat deposition within the pancreas. Example image 51/series 9. No acute pancreatitis. Spleen: Normal in size, without focal abnormality. Adrenals/Urinary Tract: Normal adrenal glands. Bilateral renal cysts. No hydronephrosis. Stomach/Bowel: Normal stomach and abdominal bowel loops. Vascular/Lymphatic: Normal caliber of the aorta and branch vessels. No retroperitoneal or retrocrural adenopathy. Other: No ascites. Musculoskeletal: No acute osseous abnormality. IMPRESSION: 1. The CT abnormality represents focal fat deposition within the pancreatic head. No suspicious pancreatic lesion. 2.  No acute abdominal process. 3. Variant of pancreas divisum.  No acute pancreatitis. Electronically Signed   By: Abigail Miyamoto M.D.   On: 03/13/2016 08:51   Ct Image Guided Drainage By Percutaneous Catheter  Result Date: 03/13/2016 INDICATION: 65 year old female with a history of diverticular abscess EXAM: CT GUIDED DRAINAGE OF  ABSCESS MEDICATIONS: The patient is currently admitted to the hospital and receiving intravenous antibiotics. The antibiotics were administered within an appropriate time frame prior to the initiation of the procedure. ANESTHESIA/SEDATION: 2.0 mg IV Versed 100 mcg IV Fentanyl Moderate Sedation Time:  10 The patient was continuously  monitored during the procedure by the interventional radiology nurse under my direct supervision. COMPLICATIONS: None TECHNIQUE: Informed written consent was obtained from the patient after a thorough discussion of the procedural risks, benefits and alternatives. All questions were addressed. Maximal Sterile Barrier Technique was utilized including caps, mask, sterile gowns, sterile gloves, sterile drape, hand hygiene and skin antiseptic. A timeout was performed prior to the initiation of the procedure. PROCEDURE: Patient was positioned supine position on the CT gantry table. Scout CT was performed for planning purposes. Patient was then prepped and draped in the usual sterile fashion. The skin and subcutaneous tissues overlying the lower abdominal abscess collection was generously infiltrated 1% lidocaine for local anesthesia. Using CT guidance, a guide needle was advanced into the abscess. Once we confirmed location the needle, modified Seldinger technique was used to place a 12 French pigtail catheter drain. Approximately 10 cc - 15 cc of purulent fluid was aspirated for a sample to be sent to the lab. Catheter was sutured in position attached to gravity drainage. Patient tolerated the procedure well and remained hemodynamically stable throughout. No complications were encountered and no significant blood loss encountered. FINDINGS: Fluid and gas collection overlying the sigmoid colon, similar to the prior CT study. Inflammatory changes present. Status post drain placement there is collapse of the gas collection. IMPRESSION: Status post CT-guided drainage of abdominal abscess secondary to diverticulitis. Twelve french drain attached to gravity drainage with sample sent to the lab for analysis. Signed, Dulcy Fanny. Earleen Newport, DO [facility] Electronically Signed   By: Corrie Mckusick D.O.   On: 03/13/2016 18:25    Labs:  CBC:  Recent Labs  01/23/16 2017  02/15/16 1536 03/12/16 1321 03/13/16 0451  WBC 6.9 5.8 10.2 8.2  HGB 14.2 13.0 12.0 10.5*  HCT 44.4 39.9 38.0 34.0*  PLT 291 245 307 247    COAGS:  Recent Labs  12/17/15 2112 02/15/16 1536 03/13/16 0651  INR 1.03 1.0 1.23  APTT 23*  --  33    BMP:  Recent Labs  12/19/15 0240 01/23/16 2017 02/06/16 0906 02/15/16 1536 03/12/16 1321 03/13/16 0451  NA 141 137 140 145 139 140  K 4.5 3.6 4.0 4.0 3.3* 3.6  CL 109 99* 105 99 105 107  CO2 28 28 24  36* 26 27  GLUCOSE 86 96 110* 77 104* 95  BUN 11 7 21  6* 7 <5*  CALCIUM 8.2* 10.7* 9.2 9.1 8.9 8.6*  CREATININE 0.66 0.73 0.66 0.95 0.71 0.63  GFRNONAA >60 >60  --   --  >60 >60  GFRAA >60 >60  --   --  >60 >60    LIVER FUNCTION TESTS:  Recent Labs  12/17/15 2112 12/18/15 0510 02/06/16 0906 03/12/16 1321  BILITOT 0.6 0.7 0.3 0.7  AST 18 14* 18 11*  ALT 18 16 16  13*  ALKPHOS 85 78 101 77  PROT 6.0* 5.8* 6.3 6.2*  ALBUMIN 3.5 3.1* 3.9 3.1*    Assessment and Plan:  LLQ abscess drain Intact Will follow  Electronically Signed: Ahna Konkle A 03/14/2016, 11:47 AM   I spent a total of 15 Minutes at the the patient's bedside AND on the patient's hospital floor or unit, greater than 50% of which was counseling/coordinating care for abscess drain

## 2016-03-14 NOTE — Progress Notes (Signed)
Patient ID: Dorothy White, female   DOB: Jan 27, 1951, 65 y.o.   MRN: [id number]  [facility] Surgery Progress Note     Subjective: Abdominal pain slightly improved from yesterday. Denies any current n/v. Reports some flatus and a small single stringy/liquid Bm. Asking for liquids.  Objective: Vital signs in last 24 hours: Temp:  [98.6 F (37 C)-100.5 F (38.1 C)] 98.6 F (37 C) (08/18 0815) Pulse Rate:  [73-84] 79 (08/18 0815) Resp:  [14-20] 17 (08/18 0815) BP: (91-127)/(33-52) 108/52 (08/18 0815) SpO2:  [93 %-98 %] 94 % (08/18 0815) Weight:  [126 lb 5.2 oz (57.3 kg)] 126 lb 5.2 oz (57.3 kg) (08/17 2118)    Intake/Output from previous day: 08/17 0701 - 08/18 0700 In: 350 [IV Piggyback:350] Out: 0  Intake/Output this shift: No intake/output data recorded.  PE: Gen:  Alert, NAD, pleasant Card:  RRR Pulm:  CTAB Abd: Soft, slightly tender left side of abdomen, ND, +BS, perc drain site cdi  Perc drain with minimal output, none documented yet but has <15cc in bag   Lab Results:   Recent Labs  03/12/16 1321 03/13/16 0451  WBC 10.2 8.2  HGB 12.0 10.5*  HCT 38.0 34.0*  PLT 307 247   BMET  Recent Labs  03/12/16 1321 03/13/16 0451  NA 139 140  K 3.3* 3.6  CL 105 107  CO2 26 27  GLUCOSE 104* 95  BUN 7 <5*  CREATININE 0.71 0.63  CALCIUM 8.9 8.6*   PT/INR  Recent Labs  03/13/16 0651  LABPROT 15.6*  INR 1.23   CMP     Component Value Date/Time   NA 140 03/13/2016 0451   K 3.6 03/13/2016 0451   CL 107 03/13/2016 0451   CO2 27 03/13/2016 0451   GLUCOSE 95 03/13/2016 0451   BUN <5 (L) 03/13/2016 0451   CREATININE 0.63 03/13/2016 0451   CREATININE 0.95 02/15/2016 1536   CALCIUM 8.6 (L) 03/13/2016 0451   CALCIUM 9.4 01/13/2007 2356   PROT 6.2 (L) 03/12/2016 1321   ALBUMIN 3.1 (L) 03/12/2016 1321   AST 11 (L) 03/12/2016 1321   ALT 13 (L) 03/12/2016 1321   ALKPHOS 77 03/12/2016 1321   BILITOT 0.7 03/12/2016 1321   GFRNONAA >60 03/13/2016  0451   GFRAA >60 03/13/2016 0451   Lipase     Component Value Date/Time   LIPASE 16 03/12/2016 1321       Studies/Results: Mr Abdomen W Wo Contrast  Result Date: 03/13/2016 CLINICAL DATA:  CT demonstrating a possible pancreatic head lesion. EXAM: MRI ABDOMEN WITHOUT AND WITH CONTRAST TECHNIQUE: Multiplanar multisequence MR imaging of the abdomen was performed both before and after the administration of intravenous contrast. CONTRAST:  90mL MULTIHANCE GADOBENATE DIMEGLUMINE 529 MG/ML IV SOLN COMPARISON:  03/12/2016 CT FINDINGS: Lower chest: Bilateral breast implants. Bibasilar atelectasis. Normal heart size without pericardial or pleural effusion. Hepatobiliary: No focal liver lesion. Hepatomegaly at 21.4 cm. There is minimal left anterior hepatic lobe intrahepatic duct dilatation, of indeterminate etiology. Example image 17/ series 12003. Normal gallbladder, without biliary ductal dilatation. Pancreas: No pancreatic duct dilatation. There is a variant of pancreas divisum, with a prominent dorsal duct entering the duodenum on image 26/ series 3. Communication with the common duct is identified. The CT abnormality was likely secondary to the focal fat deposition within the pancreas. Example image 51/series 9. No acute pancreatitis. Spleen: Normal in size, without focal abnormality. Adrenals/Urinary Tract: Normal adrenal glands. Bilateral renal cysts. No hydronephrosis. Stomach/Bowel: Normal stomach and abdominal  bowel loops. Vascular/Lymphatic: Normal caliber of the aorta and branch vessels. No retroperitoneal or retrocrural adenopathy. Other: No ascites. Musculoskeletal: No acute osseous abnormality. IMPRESSION: 1. The CT abnormality represents focal fat deposition within the pancreatic head. No suspicious pancreatic lesion. 2.  No acute abdominal process. 3. Variant of pancreas divisum.  No acute pancreatitis. Electronically Signed   By: Abigail Miyamoto M.D.   On: 03/13/2016 08:51   Ct Abdomen Pelvis W  Contrast  Result Date: 03/12/2016 CLINICAL DATA:  Left lower quadrant pain for 1 week with nausea vomiting and diarrhea. Fever. EXAM: CT ABDOMEN AND PELVIS WITH CONTRAST TECHNIQUE: Multidetector CT imaging of the abdomen and pelvis was performed using the standard protocol following bolus administration of intravenous contrast. CONTRAST:  14mL ISOVUE-300 IOPAMIDOL (ISOVUE-300) INJECTION 61% COMPARISON:  10/28/2013 FINDINGS: Lower chest: Dependent atelectasis seen in the lower lobes bilaterally. Hepatobiliary: No focal abnormality within the liver parenchyma. There is no evidence for gallstones, gallbladder wall thickening, or pericholecystic fluid. No intrahepatic or extrahepatic biliary dilation. Pancreas: Ill-defined hypo enhancing soft tissue in the anterior pancreatic head is associated with some mild distention of the main pancreatic duct in the same region. Spleen: No splenomegaly. No focal mass lesion. Adrenals/Urinary Tract: No adrenal nodule or mass. Early excretion of contrast noted in the collecting systems bilaterally. Tiny cysts identified upper pole left kidney. No enhancing lesion identified in either kidney. No evidence for hydroureter. Mild circumferential bladder wall thickening appears slightly out of proportion to the degree of decompression. Stomach/Bowel: Stomach is nondistended. No gastric wall thickening. No evidence of outlet obstruction. Duodenum is normally positioned as is the ligament of Treitz. No small bowel wall thickening. No small bowel dilatation. The terminal ileum is normal. The appendix is not visualized, but there is no edema or inflammation in the region of the cecum. Diverticular changes are noted in the left colon. In the proximal sigmoid segment, there is colonic wall thickening with pericolonic edema/inflammation and a focal relatively thick walled collection of fluid and gas measuring 4.2 x 3.2 x 5.3 cm. Vascular/Lymphatic: No abdominal aortic aneurysm. There is no  gastrohepatic or hepatoduodenal ligament lymphadenopathy. No intraperitoneal or retroperitoneal lymphadenopathy. No pelvic sidewall lymphadenopathy. Reproductive: Uterus is surgically absent. There is no adnexal mass. Other: Trace intraperitoneal free fluid noted in the cul-de-sac. Musculoskeletal: Bone windows reveal no worrisome lytic or sclerotic osseous lesions. IMPRESSION: 1. Proximal sigmoid diverticulitis with contained para colonic abscess as above. This is contiguous with the urinary bladder which may account for the mild circumferential bladder wall thickening. 2. Ill-defined low-attenuation in the head of the pancreas with dilatation of the main pancreatic duct. Adenocarcinoma cannot be excluded. MRI of the abdomen without and with contrast is recommended to further evaluate. 3. Trace free fluid in the cul-de-sac. Electronically Signed   By: Misty Stanley M.D.   On: 03/12/2016 20:32   Mr 3d Recon At Scanner  Result Date: 03/13/2016 CLINICAL DATA:  CT demonstrating a possible pancreatic head lesion. EXAM: MRI ABDOMEN WITHOUT AND WITH CONTRAST TECHNIQUE: Multiplanar multisequence MR imaging of the abdomen was performed both before and after the administration of intravenous contrast. CONTRAST:  38mL MULTIHANCE GADOBENATE DIMEGLUMINE 529 MG/ML IV SOLN COMPARISON:  03/12/2016 CT FINDINGS: Lower chest: Bilateral breast implants. Bibasilar atelectasis. Normal heart size without pericardial or pleural effusion. Hepatobiliary: No focal liver lesion. Hepatomegaly at 21.4 cm. There is minimal left anterior hepatic lobe intrahepatic duct dilatation, of indeterminate etiology. Example image 17/ series 12003. Normal gallbladder, without biliary ductal dilatation. Pancreas: No pancreatic  duct dilatation. There is a variant of pancreas divisum, with a prominent dorsal duct entering the duodenum on image 26/ series 3. Communication with the common duct is identified. The CT abnormality was likely secondary to the  focal fat deposition within the pancreas. Example image 51/series 9. No acute pancreatitis. Spleen: Normal in size, without focal abnormality. Adrenals/Urinary Tract: Normal adrenal glands. Bilateral renal cysts. No hydronephrosis. Stomach/Bowel: Normal stomach and abdominal bowel loops. Vascular/Lymphatic: Normal caliber of the aorta and branch vessels. No retroperitoneal or retrocrural adenopathy. Other: No ascites. Musculoskeletal: No acute osseous abnormality. IMPRESSION: 1. The CT abnormality represents focal fat deposition within the pancreatic head. No suspicious pancreatic lesion. 2.  No acute abdominal process. 3. Variant of pancreas divisum.  No acute pancreatitis. Electronically Signed   By: Abigail Miyamoto M.D.   On: 03/13/2016 08:51   Ct Image Guided Drainage By Percutaneous Catheter  Result Date: 03/13/2016 INDICATION: 65 year old female with a history of diverticular abscess EXAM: CT GUIDED DRAINAGE OF  ABSCESS MEDICATIONS: The patient is currently admitted to the hospital and receiving intravenous antibiotics. The antibiotics were administered within an appropriate time frame prior to the initiation of the procedure. ANESTHESIA/SEDATION: 2.0 mg IV Versed 100 mcg IV Fentanyl Moderate Sedation Time:  10 The patient was continuously monitored during the procedure by the interventional radiology nurse under my direct supervision. COMPLICATIONS: None TECHNIQUE: Informed written consent was obtained from the patient after a thorough discussion of the procedural risks, benefits and alternatives. All questions were addressed. Maximal Sterile Barrier Technique was utilized including caps, mask, sterile gowns, sterile gloves, sterile drape, hand hygiene and skin antiseptic. A timeout was performed prior to the initiation of the procedure. PROCEDURE: Patient was positioned supine position on the CT gantry table. Scout CT was performed for planning purposes. Patient was then prepped and draped in the usual  sterile fashion. The skin and subcutaneous tissues overlying the lower abdominal abscess collection was generously infiltrated 1% lidocaine for local anesthesia. Using CT guidance, a guide needle was advanced into the abscess. Once we confirmed location the needle, modified Seldinger technique was used to place a 12 French pigtail catheter drain. Approximately 10 cc - 15 cc of purulent fluid was aspirated for a sample to be sent to the lab. Catheter was sutured in position attached to gravity drainage. Patient tolerated the procedure well and remained hemodynamically stable throughout. No complications were encountered and no significant blood loss encountered. FINDINGS: Fluid and gas collection overlying the sigmoid colon, similar to the prior CT study. Inflammatory changes present. Status post drain placement there is collapse of the gas collection. IMPRESSION: Status post CT-guided drainage of abdominal abscess secondary to diverticulitis. Twelve french drain attached to gravity drainage with sample sent to the lab for analysis. Signed, Dulcy Fanny. Earleen Newport, DO [facility] Electronically Signed   By: Corrie Mckusick D.O.   On: 03/13/2016 18:25    Anti-infectives: Anti-infectives    Start     Dose/Rate Route Frequency Ordered Stop   03/13/16 2200  levofloxacin (LEVAQUIN) IVPB 750 mg     750 mg 100 mL/hr over 90 Minutes Intravenous Every 24 hours 03/12/16 2316     03/13/16 0800  metroNIDAZOLE (FLAGYL) IVPB 500 mg     500 mg 100 mL/hr over 60 Minutes Intravenous Every 8 hours 03/13/16 0737     03/12/16 2130  metroNIDAZOLE (FLAGYL) IVPB 500 mg     500 mg 100 mL/hr over 60 Minutes Intravenous  Once 03/12/16 2115  03/12/16 2228   03/12/16 2115  levofloxacin (LEVAQUIN) IVPB 750 mg     750 mg 100 mL/hr over 90 Minutes Intravenous  Once 03/12/16 2115 03/12/16 2320       Assessment/Plan Sigmoid diverticulitis with abscess - IR placed 19F pigtail  drain 03/13/16 with 10-15 cc purulent fluid aspirated, minimal output from drain since (not yet documented, <15cc in bag)  Pancreatic lesion - found incidentally on CT, MRI's done yesterday. Reports fat deposition within the pancreatic head and no suspicious pancreatic lesion. Will review with Dr. Brantley Stage. CAD - stable; LHC 01/2016 25% stenosis of LAD; ASA  H/o CVA GERD Depression - BuSpar, Prozac, trazodone at home  FEN: clears ID: Levaquin, Flagyl  Plan: continue IV abx, monitor drain output, start clears   LOS: 2 days    [name] , [facility] 03/14/2016, 10:21 AM Pager: [phone] Consults: [phone] Mon-Fri 7:00 am-4:30 pm Sat-Sun 7:00 am-11:30 am

## 2016-03-15 DIAGNOSIS — Z8619 Personal history of other infectious and parasitic diseases: Secondary | ICD-10-CM | POA: Insufficient documentation

## 2016-03-15 DIAGNOSIS — A498 Other bacterial infections of unspecified site: Secondary | ICD-10-CM | POA: Diagnosis present

## 2016-03-15 DIAGNOSIS — Z1612 Extended spectrum beta lactamase (ESBL) resistance: Secondary | ICD-10-CM

## 2016-03-15 LAB — CBC
HCT: 33.3 % — ABNORMAL LOW (ref 36.0–46.0)
HEMOGLOBIN: 10.4 g/dL — AB (ref 12.0–15.0)
MCH: 28.2 pg (ref 26.0–34.0)
MCHC: 31.2 g/dL (ref 30.0–36.0)
MCV: 90.2 fL (ref 78.0–100.0)
PLATELETS: 277 10*3/uL (ref 150–400)
RBC: 3.69 MIL/uL — AB (ref 3.87–5.11)
RDW: 13.1 % (ref 11.5–15.5)
WBC: 7 10*3/uL (ref 4.0–10.5)

## 2016-03-15 LAB — BASIC METABOLIC PANEL
Anion gap: 9 (ref 5–15)
CHLORIDE: 104 mmol/L (ref 101–111)
CO2: 27 mmol/L (ref 22–32)
CREATININE: 0.49 mg/dL (ref 0.44–1.00)
Calcium: 8.5 mg/dL — ABNORMAL LOW (ref 8.9–10.3)
Glucose, Bld: 97 mg/dL (ref 65–99)
POTASSIUM: 3.6 mmol/L (ref 3.5–5.1)
SODIUM: 140 mmol/L (ref 135–145)

## 2016-03-15 MED ORDER — SODIUM CHLORIDE 0.9 % IV SOLN
500.0000 mg | Freq: Three times a day (TID) | INTRAVENOUS | Status: DC
  Administered 2016-03-15 – 2016-03-18 (×8): 500 mg via INTRAVENOUS
  Filled 2016-03-15 (×10): qty 500

## 2016-03-15 NOTE — Progress Notes (Addendum)
Patient Demographics:    Dorothy White, is a 65 y.o. female, DOB - Jun 22, 1951, [id number]  Admit date - 03/12/2016   Admitting Physician Karmen Bongo, MD  Outpatient Primary MD for the patient is WEBB, [name], MD  LOS - 3   Chief Complaint  Patient presents with  . Abdominal Pain       Subjective:    Dorothy White today has no fevers, no emesis,  No chest pain,    Assessment  & Plan :    Principal Problem:   Infection due to ESBL-producing Escherichia coli/Diverticular Abscess Active Problems:   Anxiety and depression   Diverticulitis   Chronic pain syndrome   Lesion of pancreas   History of chest pain   Hypokalemia   Brief Narrative: Patient is a 65 y.o. female with history of nonobstructive CAD ([place] July 2017 showed 25% stenosis at the mid LAD) GERD, depression who presented for evaluation of left lower abdominal pain. Further evaluation revealed sigmoid diverticulitis with a abscess. Gen. surgery was consulted, subsequently underwent CT-guided placement of a pigtail drain on 8/17. Was on empiric levofloxacin and Flagyl , now swithed to Imipenem on 03/15/16. See below for further details. Aspirate from drainage obtained on 03/13/2016 is growing ESBL Escherichia coli, antibiotics changed to imipenem  Assessment/Plan: 1)Sigmoid diverticulitis with paracolic abscess:  S/p  CT-guided pigtail drain placement on 8/17. No significant drainage in the last 12 hours in her bag, tolerating liquid diet well since 03/14/2016. General surgery and interventional radiology consult appreciated. Aspirate from drainage obtained on 03/13/2016 is growing ESBL Escherichia coli, antibiotics changed to imipenem, so stop Levaquin and Flagyl and start imipenem as ordered on 03/15/16 . She will most likely need elective partial colectomy in the near future.   2)Nonobstructive CAD: Recent [facility] July 2017 showed 25%  stenosis at the mid LAD is an, stable, asymptomatic, c/n  aspirin.  3)Possible Pancreatic lesion seen on CT scan: MRI of the abdomen shows focal fat deposition within the pancreatic head without any suspicious pancreatic lesions-no further workup planned at this time    4)Anxiety/depression: Stable, continue with BuSpar, Prozac and trazodone.    Code Status : full   Disposition Plan  : Once oral intake is reliable, possibly discharge home if ok  with general surgeon  Consults  :  IR and general surgery   DVT Prophylaxis  :    SCDs   Lab Results  Component Value Date   PLT 277 03/15/2016    Inpatient Medications  Scheduled Meds: . aspirin EC  81 mg Oral QODAY  . busPIRone  5 mg Oral TID  . feeding supplement (ENSURE ENLIVE)  237 mL Oral BID BM  . FLUoxetine  60 mg Oral Daily  . imipenem-cilastatin  500 mg Intravenous Q8H  . traZODone  200 mg Oral QHS   Continuous Infusions: . lactated ringers 100 mL/hr at 03/15/16 0850   PRN Meds:.acetaminophen **OR** acetaminophen, antiseptic oral rinse, clonazePAM, ketorolac **AND** metoCLOPramide (REGLAN) injection **AND** diphenhydrAMINE (BENADRYL) IVPB(SICKLE CELL ONLY), HYDROmorphone (DILAUDID) injection, ondansetron **OR** ondansetron (ZOFRAN) IV, oxyCODONE-acetaminophen **AND** oxyCODONE    Anti-infectives    Start     Dose/Rate Route Frequency Ordered Stop   03/15/16 2200  imipenem-cilastatin (PRIMAXIN) 500 mg in sodium chloride  0.9 % 100 mL IVPB     500 mg 200 mL/hr over 30 Minutes Intravenous Every 8 hours 03/15/16 1810     03/13/16 2200  levofloxacin (LEVAQUIN) IVPB 750 mg  Status:  Discontinued     750 mg 100 mL/hr over 90 Minutes Intravenous Every 24 hours 03/12/16 2316 03/15/16 1810   03/13/16 0800  metroNIDAZOLE (FLAGYL) IVPB 500 mg  Status:  Discontinued     500 mg 100 mL/hr over 60 Minutes Intravenous Every 8 hours 03/13/16 0737 03/15/16 1810   03/12/16 2130  metroNIDAZOLE (FLAGYL) IVPB 500 mg     500 mg 100  mL/hr over 60 Minutes Intravenous  Once 03/12/16 2115 03/12/16 2228   03/12/16 2115  levofloxacin (LEVAQUIN) IVPB 750 mg     750 mg 100 mL/hr over 90 Minutes Intravenous  Once 03/12/16 2115 03/12/16 2320        Objective:   Vitals:   03/14/16 2053 03/15/16 0623 03/15/16 0753 03/15/16 1617  BP: (!) 134/55 (!) 113/50 (!) 131/42 (!) 113/57  Pulse: 68 75 80 62  Resp: 17 17 18 17   Temp: 98.5 F (36.9 C) 98.6 F (37 C) 98.6 F (37 C) 97.8 F (36.6 C)  TempSrc: Oral Oral Oral Oral  SpO2: 99% 94% 96% 98%  Weight: 57.4 kg (126 lb 8.7 oz)     Height:        Wt Readings from Last 3 Encounters:  03/14/16 57.4 kg (126 lb 8.7 oz)  02/21/16 57.2 kg (126 lb 3.2 oz)  02/15/16 57.6 kg (127 lb)     Intake/Output Summary (Last 24 hours) at 03/15/16 1813 Last data filed at 03/15/16 1042  Gross per 24 hour  Intake             1310 ml  Output               10 ml  Net             1300 ml     Physical Exam  Gen:- Awake Alert,  In no apparent distress  HEENT:- [place].AT, No sclera icterus Neck-Supple Neck,No JVD,.  Lungs-  CTAB  CV- S1, S2 normal Abd-  +ve B.Sounds, Abd Soft, No significant tenderness,  left lower quadrant with drainage tube no significant amount of drainage in her back Extremity/Skin:- No  edema,       Data Review:   Micro Results Recent Results (from the past 240 hour(s))  Aerobic/Anaerobic Culture (surgical/deep wound)     Status: None (Preliminary result)   Collection Time: 03/13/16  2:44 PM  Result Value Ref Range Status   Specimen Description ABSCESS ABDOMEN  Final   Special Requests NONE  Final   Gram Stain   Final    ABUNDANT WBC PRESENT, PREDOMINANTLY PMN MODERATE GRAM NEGATIVE RODS RARE GRAM POSITIVE COCCI IN PAIRS    Culture   Final    ABUNDANT ESCHERICHIA COLI Confirmed Extended Spectrum Beta-Lactamase Producer (ESBL)    Report Status PENDING  Incomplete   Organism ID, Bacteria ESCHERICHIA COLI  Final      Susceptibility   Escherichia coli -  MIC*    AMPICILLIN >=32 RESISTANT Resistant     CEFAZOLIN >=64 RESISTANT Resistant     CEFEPIME <=1 RESISTANT Resistant     CEFTAZIDIME >=64 RESISTANT Resistant     CEFTRIAXONE >=64 RESISTANT Resistant     CIPROFLOXACIN >=4 RESISTANT Resistant     GENTAMICIN 4 SENSITIVE Sensitive     IMIPENEM <=0.25 SENSITIVE Sensitive  TRIMETH/SULFA <=20 SENSITIVE Sensitive     AMPICILLIN/SULBACTAM >=32 RESISTANT Resistant     PIP/TAZO >=128 RESISTANT Resistant     Extended ESBL POSITIVE Resistant     * ABUNDANT ESCHERICHIA COLI    Radiology Reports Mr Abdomen W Wo Contrast  Result Date: 03/13/2016 CLINICAL DATA:  CT demonstrating a possible pancreatic head lesion. EXAM: MRI ABDOMEN WITHOUT AND WITH CONTRAST TECHNIQUE: Multiplanar multisequence MR imaging of the abdomen was performed both before and after the administration of intravenous contrast. CONTRAST:  30mL MULTIHANCE GADOBENATE DIMEGLUMINE 529 MG/ML IV SOLN COMPARISON:  03/12/2016 CT FINDINGS: Lower chest: Bilateral breast implants. Bibasilar atelectasis. Normal heart size without pericardial or pleural effusion. Hepatobiliary: No focal liver lesion. Hepatomegaly at 21.4 cm. There is minimal left anterior hepatic lobe intrahepatic duct dilatation, of indeterminate etiology. Example image 17/ series 12003. Normal gallbladder, without biliary ductal dilatation. Pancreas: No pancreatic duct dilatation. There is a variant of pancreas divisum, with a prominent dorsal duct entering the duodenum on image 26/ series 3. Communication with the common duct is identified. The CT abnormality was likely secondary to the focal fat deposition within the pancreas. Example image 51/series 9. No acute pancreatitis. Spleen: Normal in size, without focal abnormality. Adrenals/Urinary Tract: Normal adrenal glands. Bilateral renal cysts. No hydronephrosis. Stomach/Bowel: Normal stomach and abdominal bowel loops. Vascular/Lymphatic: Normal caliber of the aorta and branch  vessels. No retroperitoneal or retrocrural adenopathy. Other: No ascites. Musculoskeletal: No acute osseous abnormality. IMPRESSION: 1. The CT abnormality represents focal fat deposition within the pancreatic head. No suspicious pancreatic lesion. 2.  No acute abdominal process. 3. Variant of pancreas divisum.  No acute pancreatitis. Electronically Signed   By: Abigail Miyamoto M.D.   On: 03/13/2016 08:51   Ct Abdomen Pelvis W Contrast  Result Date: 03/12/2016 CLINICAL DATA:  Left lower quadrant pain for 1 week with nausea vomiting and diarrhea. Fever. EXAM: CT ABDOMEN AND PELVIS WITH CONTRAST TECHNIQUE: Multidetector CT imaging of the abdomen and pelvis was performed using the standard protocol following bolus administration of intravenous contrast. CONTRAST:  172mL ISOVUE-300 IOPAMIDOL (ISOVUE-300) INJECTION 61% COMPARISON:  10/28/2013 FINDINGS: Lower chest: Dependent atelectasis seen in the lower lobes bilaterally. Hepatobiliary: No focal abnormality within the liver parenchyma. There is no evidence for gallstones, gallbladder wall thickening, or pericholecystic fluid. No intrahepatic or extrahepatic biliary dilation. Pancreas: Ill-defined hypo enhancing soft tissue in the anterior pancreatic head is associated with some mild distention of the main pancreatic duct in the same region. Spleen: No splenomegaly. No focal mass lesion. Adrenals/Urinary Tract: No adrenal nodule or mass. Early excretion of contrast noted in the collecting systems bilaterally. Tiny cysts identified upper pole left kidney. No enhancing lesion identified in either kidney. No evidence for hydroureter. Mild circumferential bladder wall thickening appears slightly out of proportion to the degree of decompression. Stomach/Bowel: Stomach is nondistended. No gastric wall thickening. No evidence of outlet obstruction. Duodenum is normally positioned as is the ligament of Treitz. No small bowel wall thickening. No small bowel dilatation. The  terminal ileum is normal. The appendix is not visualized, but there is no edema or inflammation in the region of the cecum. Diverticular changes are noted in the left colon. In the proximal sigmoid segment, there is colonic wall thickening with pericolonic edema/inflammation and a focal relatively thick walled collection of fluid and gas measuring 4.2 x 3.2 x 5.3 cm. Vascular/Lymphatic: No abdominal aortic aneurysm. There is no gastrohepatic or hepatoduodenal ligament lymphadenopathy. No intraperitoneal or retroperitoneal lymphadenopathy. No pelvic sidewall lymphadenopathy. Reproductive: Uterus  is surgically absent. There is no adnexal mass. Other: Trace intraperitoneal free fluid noted in the cul-de-sac. Musculoskeletal: Bone windows reveal no worrisome lytic or sclerotic osseous lesions. IMPRESSION: 1. Proximal sigmoid diverticulitis with contained para colonic abscess as above. This is contiguous with the urinary bladder which may account for the mild circumferential bladder wall thickening. 2. Ill-defined low-attenuation in the head of the pancreas with dilatation of the main pancreatic duct. Adenocarcinoma cannot be excluded. MRI of the abdomen without and with contrast is recommended to further evaluate. 3. Trace free fluid in the cul-de-sac. Electronically Signed   By: Misty Stanley M.D.   On: 03/12/2016 20:32   Mr 3d Recon At Scanner  Result Date: 03/13/2016 CLINICAL DATA:  CT demonstrating a possible pancreatic head lesion. EXAM: MRI ABDOMEN WITHOUT AND WITH CONTRAST TECHNIQUE: Multiplanar multisequence MR imaging of the abdomen was performed both before and after the administration of intravenous contrast. CONTRAST:  26mL MULTIHANCE GADOBENATE DIMEGLUMINE 529 MG/ML IV SOLN COMPARISON:  03/12/2016 CT FINDINGS: Lower chest: Bilateral breast implants. Bibasilar atelectasis. Normal heart size without pericardial or pleural effusion. Hepatobiliary: No focal liver lesion. Hepatomegaly at 21.4 cm. There is  minimal left anterior hepatic lobe intrahepatic duct dilatation, of indeterminate etiology. Example image 17/ series 12003. Normal gallbladder, without biliary ductal dilatation. Pancreas: No pancreatic duct dilatation. There is a variant of pancreas divisum, with a prominent dorsal duct entering the duodenum on image 26/ series 3. Communication with the common duct is identified. The CT abnormality was likely secondary to the focal fat deposition within the pancreas. Example image 51/series 9. No acute pancreatitis. Spleen: Normal in size, without focal abnormality. Adrenals/Urinary Tract: Normal adrenal glands. Bilateral renal cysts. No hydronephrosis. Stomach/Bowel: Normal stomach and abdominal bowel loops. Vascular/Lymphatic: Normal caliber of the aorta and branch vessels. No retroperitoneal or retrocrural adenopathy. Other: No ascites. Musculoskeletal: No acute osseous abnormality. IMPRESSION: 1. The CT abnormality represents focal fat deposition within the pancreatic head. No suspicious pancreatic lesion. 2.  No acute abdominal process. 3. Variant of pancreas divisum.  No acute pancreatitis. Electronically Signed   By: Abigail Miyamoto M.D.   On: 03/13/2016 08:51   Ct Image Guided Drainage By Percutaneous Catheter  Result Date: 03/13/2016 INDICATION: 65 year old female with a history of diverticular abscess EXAM: CT GUIDED DRAINAGE OF  ABSCESS MEDICATIONS: The patient is currently admitted to the hospital and receiving intravenous antibiotics. The antibiotics were administered within an appropriate time frame prior to the initiation of the procedure. ANESTHESIA/SEDATION: 2.0 mg IV Versed 100 mcg IV Fentanyl Moderate Sedation Time:  10 The patient was continuously monitored during the procedure by the interventional radiology nurse under my direct supervision. COMPLICATIONS: None TECHNIQUE: Informed written consent was obtained from the patient after a thorough discussion of the procedural risks, benefits and  alternatives. All questions were addressed. Maximal Sterile Barrier Technique was utilized including caps, mask, sterile gowns, sterile gloves, sterile drape, hand hygiene and skin antiseptic. A timeout was performed prior to the initiation of the procedure. PROCEDURE: Patient was positioned supine position on the CT gantry table. Scout CT was performed for planning purposes. Patient was then prepped and draped in the usual sterile fashion. The skin and subcutaneous tissues overlying the lower abdominal abscess collection was generously infiltrated 1% lidocaine for local anesthesia. Using CT guidance, a guide needle was advanced into the abscess. Once we confirmed location the needle, modified Seldinger technique was used to place a 12 French pigtail catheter drain. Approximately 10 cc - 15 cc of purulent  fluid was aspirated for a sample to be sent to the lab. Catheter was sutured in position attached to gravity drainage. Patient tolerated the procedure well and remained hemodynamically stable throughout. No complications were encountered and no significant blood loss encountered. FINDINGS: Fluid and gas collection overlying the sigmoid colon, similar to the prior CT study. Inflammatory changes present. Status post drain placement there is collapse of the gas collection. IMPRESSION: Status post CT-guided drainage of abdominal abscess secondary to diverticulitis. Twelve french drain attached to gravity drainage with sample sent to the lab for analysis. Signed, Dulcy Fanny. Earleen Newport, DO [facility] Radiology Electronically Signed   By: Corrie Mckusick D.O.   On: 03/13/2016 18:25     CBC  Recent Labs Lab 03/12/16 1321 03/13/16 0451 03/15/16 0503  WBC 10.2 8.2 7.0  HGB 12.0 10.5* 10.4*  HCT 38.0 34.0* 33.3*  PLT 307 247 277  MCV 90.9 90.9 90.2  MCH 28.7 28.1 28.2  MCHC 31.6 30.9 31.2  RDW 13.5 13.5 13.1    Chemistries   Recent Labs Lab 03/12/16 1321  03/13/16 0451 03/15/16 0503  NA 139 140 140  K 3.3* 3.6 3.6  CL 105 107 104  CO2 26 27 27   GLUCOSE 104* 95 97  BUN 7 <5* <5*  CREATININE 0.71 0.63 0.49  CALCIUM 8.9 8.6* 8.5*  AST 11*  --   --   ALT 13*  --   --   ALKPHOS 77  --   --   BILITOT 0.7  --   --    ------------------------------------------------------------------------------------------------------------------ No results for input(s): CHOL, HDL, LDLCALC, TRIG, CHOLHDL, LDLDIRECT in the last 72 hours.  Lab Results  Component Value Date   HGBA1C 5.2 12/18/2015   ------------------------------------------------------------------------------------------------------------------ No results for input(s): TSH, T4TOTAL, T3FREE, THYROIDAB in the last 72 hours.  Invalid input(s): FREET3 ------------------------------------------------------------------------------------------------------------------ No results for input(s): VITAMINB12, FOLATE, FERRITIN, TIBC, IRON, RETICCTPCT in the last 72 hours.  Coagulation profile  Recent Labs Lab 03/13/16 0651  INR 1.23    No results for input(s): DDIMER in the last 72 hours.  Cardiac Enzymes No results for input(s): CKMB, TROPONINI, MYOGLOBIN in the last 168 hours.  Invalid input(s): CK ------------------------------------------------------------------------------------------------------------------ No results found for: BNP   Vraj Denardo M.D on 03/15/2016 at 6:13 PM  Between 7am to 7pm - Pager - [phone]  After 7pm go to [email or web address] - password [email or web address]  [facility] -  Office  [phone]  Dragon dictation system was used to create this note, attempts have been made to correct errors, however presence of uncorrected errors is not a reflection quality of care provided

## 2016-03-15 NOTE — Progress Notes (Signed)
Patient ID: Dorothy White, female   DOB: [birth year], 65 y.o.   MRN: [id number] [facility] Surgery Progress Note:   * No surgery found *  Subjective: Mental status is clear.  Objective: Vital signs in last 24 hours: Temp:  [98.3 F (36.8 C)-98.6 F (37 C)] 98.6 F (37 C) (08/19 0753) Pulse Rate:  [64-80] 80 (08/19 0753) Resp:  [17-18] 18 (08/19 0753) BP: (113-134)/(42-66) 131/42 (08/19 0753) SpO2:  [94 %-99 %] 96 % (08/19 0753) Weight:  [57.4 kg (126 lb 8.7 oz)] 57.4 kg (126 lb 8.7 oz) (08/18 2053)  Intake/Output from previous day: 08/18 0701 - 08/19 0700 In: 1190 [P.O.:1040; IV Piggyback:150] Out: 10 [Drains:10] Intake/Output this shift: Total I/O In: 240 [P.O.:240] Out: 0   Physical Exam: Work of breathing is normal.  Percutaneous drainage has slowed and bloody brown  Lab Results:  Results for orders placed or performed during the hospital encounter of 03/12/16 (from the past 48 hour(s))  Aerobic/Anaerobic Culture (surgical/deep wound)     Status: None (Preliminary result)   Collection Time: 03/13/16  2:44 PM  Result Value Ref Range   Specimen Description ABSCESS ABDOMEN    Special Requests NONE    Gram Stain      ABUNDANT WBC PRESENT, PREDOMINANTLY PMN MODERATE GRAM NEGATIVE RODS RARE GRAM POSITIVE COCCI IN PAIRS    Culture      ABUNDANT ESCHERICHIA COLI SUSCEPTIBILITIES TO FOLLOW    Report Status PENDING   CBC     Status: Abnormal   Collection Time: 03/15/16  5:03 AM  Result Value Ref Range   WBC 7.0 4.0 - 10.5 K/uL   RBC 3.69 (L) 3.87 - 5.11 MIL/uL   Hemoglobin 10.4 (L) 12.0 - 15.0 g/dL   HCT 33.3 (L) 36.0 - 46.0 %   MCV 90.2 78.0 - 100.0 fL   MCH 28.2 26.0 - 34.0 pg   MCHC 31.2 30.0 - 36.0 g/dL   RDW 13.1 11.5 - 15.5 %   Platelets 277 150 - 400 K/uL  Basic metabolic panel     Status: Abnormal   Collection Time: 03/15/16  5:03 AM  Result Value Ref Range   Sodium 140 135 - 145 mmol/L   Potassium 3.6 3.5 - 5.1 mmol/L   Chloride 104 101 - 111  mmol/L   CO2 27 22 - 32 mmol/L   Glucose, Bld 97 65 - 99 mg/dL   BUN <5 (L) 6 - 20 mg/dL   Creatinine, Ser 0.49 0.44 - 1.00 mg/dL   Calcium 8.5 (L) 8.9 - 10.3 mg/dL   GFR calc non Af Amer >60 >60 mL/min   GFR calc Af Amer >60 >60 mL/min    Comment: (NOTE) The eGFR has been calculated using the CKD EPI equation. This calculation has not been validated in all clinical situations. eGFR's persistently <60 mL/min signify possible Chronic Kidney Disease.    Anion gap 9 5 - 15    Radiology/Results: Ct Image Guided Drainage By Percutaneous Catheter  Result Date: 03/13/2016 INDICATION: 65 year old female with a history of diverticular abscess EXAM: CT GUIDED DRAINAGE OF  ABSCESS MEDICATIONS: The patient is currently admitted to the hospital and receiving intravenous antibiotics. The antibiotics were administered within an appropriate time frame prior to the initiation of the procedure. ANESTHESIA/SEDATION: 2.0 mg IV Versed 100 mcg IV Fentanyl Moderate Sedation Time:  10 The patient was continuously monitored during the procedure by the interventional radiology nurse under my direct supervision. COMPLICATIONS: None TECHNIQUE: Informed written consent was obtained  from the patient after a thorough discussion of the procedural risks, benefits and alternatives. All questions were addressed. Maximal Sterile Barrier Technique was utilized including caps, mask, sterile gowns, sterile gloves, sterile drape, hand hygiene and skin antiseptic. A timeout was performed prior to the initiation of the procedure. PROCEDURE: Patient was positioned supine position on the CT gantry table. Scout CT was performed for planning purposes. Patient was then prepped and draped in the usual sterile fashion. The skin and subcutaneous tissues overlying the lower abdominal abscess collection was generously infiltrated 1% lidocaine for local anesthesia. Using CT guidance, a guide needle was advanced into the abscess. Once we confirmed  location the needle, modified Seldinger technique was used to place a 12 French pigtail catheter drain. Approximately 10 cc - 15 cc of purulent fluid was aspirated for a sample to be sent to the lab. Catheter was sutured in position attached to gravity drainage. Patient tolerated the procedure well and remained hemodynamically stable throughout. No complications were encountered and no significant blood loss encountered. FINDINGS: Fluid and gas collection overlying the sigmoid colon, similar to the prior CT study. Inflammatory changes present. Status post drain placement there is collapse of the gas collection. IMPRESSION: Status post CT-guided drainage of abdominal abscess secondary to diverticulitis. Twelve french drain attached to gravity drainage with sample sent to the lab for analysis. Signed, Dulcy Fanny. Earleen Newport, DO [facility] Radiology Electronically Signed   By: Corrie Mckusick D.O.   On: 03/13/2016 18:25    Anti-infectives: Anti-infectives    Start     Dose/Rate Route Frequency Ordered Stop   03/13/16 2200  levofloxacin (LEVAQUIN) IVPB 750 mg     750 mg 100 mL/hr over 90 Minutes Intravenous Every 24 hours 03/12/16 2316     03/13/16 0800  metroNIDAZOLE (FLAGYL) IVPB 500 mg     500 mg 100 mL/hr over 60 Minutes Intravenous Every 8 hours 03/13/16 0737     03/12/16 2130  metroNIDAZOLE (FLAGYL) IVPB 500 mg     500 mg 100 mL/hr over 60 Minutes Intravenous  Once 03/12/16 2115 03/12/16 2228   03/12/16 2115  levofloxacin (LEVAQUIN) IVPB 750 mg     750 mg 100 mL/hr over 90 Minutes Intravenous  Once 03/12/16 2115 03/12/16 2320      Assessment/Plan: Problem List: Patient Active Problem List   Diagnosis Date Noted  . Diverticulitis 03/12/2016  . Chronic pain syndrome 03/12/2016  . Lesion of pancreas 03/12/2016  . History of chest pain 03/12/2016  . Hypokalemia 03/12/2016  . 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 TIA (transient ischemic attack) 12/26/2015  . DOE (dyspnea on exertion) 12/26/2015  . Right sided weakness 12/17/2015  . Ataxia 12/17/2015  . Shoulder fracture 02/15/2015  . Chronic headache 10/11/2013  . Weakness generalized 08/12/2013  . Dizziness and giddiness 08/12/2013  . Abnormality of gait 07/11/2013  . Hyperlipidemia 05/07/2007  . COMMON MIGRAINE 05/07/2007  . PREMATURE VENTRICULAR CONTRACTIONS, FREQUENT 05/07/2007  . GERD 05/07/2007  . DIVERTICULOSIS, COLON 05/07/2007  . 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 OSTEOPENIA 05/07/2007  . Anxiety and depression 03/24/2007  . HEMORRHOIDS 03/24/2007  . ALLERGIC RHINITIS 03/24/2007  . LOW BACK PAIN 03/24/2007  . MIGRAINES, HX OF 03/24/2007    Discussed contipation as a cause of diverticulitis.  She may come to resection (hopefully elective) at some point.   * No surgery found *  LOS: 3 days   Matt B. Hassell Done, MD, [facility] [phone] beeper [phone]  03/15/2016 11:30 AM

## 2016-03-16 DIAGNOSIS — K5720 Diverticulitis of large intestine with perforation and abscess without bleeding: Principal | ICD-10-CM

## 2016-03-16 MED ORDER — HYDROMORPHONE HCL 1 MG/ML IJ SOLN
1.0000 mg | INTRAMUSCULAR | Status: DC | PRN
  Administered 2016-03-16 – 2016-03-19 (×11): 1 mg via INTRAVENOUS
  Filled 2016-03-16 (×11): qty 1

## 2016-03-16 NOTE — Progress Notes (Signed)
Patient ID: Dorothy White, female   DOB: 1951-07-21, 65 y.o.   MRN: [id number]                                                                PROGRESS NOTE                                                                                                                                                                                                             Patient Demographics:    Dorothy White, is a 65 y.o. female, DOB - [birth year], [id number]  Admit date - 03/12/2016   Admitting Physician Karmen Bongo, MD  Outpatient Primary MD for the patient is WEBB, [name], MD  LOS - 4  Outpatient Specialists:   Chief Complaint  Patient presents with  . Abdominal Pain       Brief Narrative   65 y.o.femalewith history of nonobstructive CAD (LHC July 2017 showed 25% stenosis at the mid LAD) GERD, depression who presented for evaluation of left lower abdominal pain. Further evaluation revealed sigmoid diverticulitis with a abscess. Gen. surgery was consulted, subsequently underwent CT-guided placement of a pigtail drain on 8/17. Was on empiric levofloxacin and Flagyl , now swithed to Imipenem on 03/15/16. See below for further details. Aspirate from drainage obtained on 03/13/2016 is growing ESBL Escherichia coli, antibiotics changed to imipenem   Subjective:    Dorothy White today has, No headache, No chest pain, No abdominal pain - No Nausea, No new weakness tingling or numbness, No Cough - SOB.     Assessment  & Plan :    Principal Problem:   Infection due to ESBL-producing Escherichia coli/Diverticular Abscess Active Problems:   Anxiety and depression   Diverticulitis   Chronic pain syndrome   Lesion of pancreas   History of chest pain   Hypokalemia  1)Sigmoid diverticulitis with paracolic abscess:  S/p  CT-guided pigtail drain placement on 8/17.No significant drainage in the last 12 hours in her bag, tolerating liquid diet well since 03/14/2016. General surgery and  interventional radiology consult appreciated. Aspirate from drainage obtained on 03/13/2016 is growing ESBL Escherichia coli, antibiotics changed to imipenem 03/15/2016 ,(stop Levaquin and Flagy) She will most likely need elective partial colectomy in the near future.   2)Nonobstructive XO:[id number] LHC July 2017 showed 25% stenosis  at the mid LAD is an, stable, asymptomatic, c/n  aspirin.  3)Possible Pancreatic lesionseen on CT scan:MRI of the abdomen shows focal fat deposition within the pancreatic head without any suspicious pancreatic lesions-no further workup planned at this time   4)Anxiety/depression: Stable, continue with BuSpar, Prozac and trazodone.    Code Status : full   Disposition Plan  : Once oral intake is reliable, possibly discharge home if ok  with general surgeon  Consults  :  IR and general surgery   DVT Prophylaxis  :    SCDs    Procedures  : LLQ abscess drain 8/17   Lab Results  Component Value Date   PLT 277 03/15/2016    Antibiotics  :  Cont imipenem  Anti-infectives    Start     Dose/Rate Route Frequency Ordered Stop   03/15/16 2200  imipenem-cilastatin (PRIMAXIN) 500 mg in sodium chloride 0.9 % 100 mL IVPB     500 mg 200 mL/hr over 30 Minutes Intravenous Every 8 hours 03/15/16 1810     03/13/16 2200  levofloxacin (LEVAQUIN) IVPB 750 mg  Status:  Discontinued     750 mg 100 mL/hr over 90 Minutes Intravenous Every 24 hours 03/12/16 2316 03/15/16 1810   03/13/16 0800  metroNIDAZOLE (FLAGYL) IVPB 500 mg  Status:  Discontinued     500 mg 100 mL/hr over 60 Minutes Intravenous Every 8 hours 03/13/16 0737 03/15/16 1810   03/12/16 2130  metroNIDAZOLE (FLAGYL) IVPB 500 mg     500 mg 100 mL/hr over 60 Minutes Intravenous  Once 03/12/16 2115 03/12/16 2228   03/12/16 2115  levofloxacin (LEVAQUIN) IVPB 750 mg     750 mg 100 mL/hr over 90 Minutes Intravenous  Once 03/12/16 2115 03/12/16 2320        Objective:   Vitals:   03/15/16 1617  03/15/16 2133 03/16/16 0434 03/16/16 0930  BP: (!) 113/57 (!) 97/45 (!) 119/40 (!) 115/55  Pulse: 62 66 71 63  Resp: 17 17 18 18   Temp: 97.8 F (36.6 C) 99 F (37.2 C) 97.5 F (36.4 C) 98.2 F (36.8 C)  TempSrc: Oral Oral Oral Oral  SpO2: 98% 95% 93% 95%  Weight:  57.6 kg (126 lb 15.8 oz)    Height:        Wt Readings from Last 3 Encounters:  03/15/16 57.6 kg (126 lb 15.8 oz)  02/21/16 57.2 kg (126 lb 3.2 oz)  02/15/16 57.6 kg (127 lb)     Intake/Output Summary (Last 24 hours) at 03/16/16 1059 Last data filed at 03/16/16 1000  Gross per 24 hour  Intake             2520 ml  Output               10 ml  Net             2510 ml     Physical Exam  Awake Alert, Oriented X 3, No new F.N deficits, Normal affect [place].AT,PERRAL Supple Neck,No JVD, No cervical lymphadenopathy appriciated.  Symmetrical Chest wall movement, Good air movement bilaterally, CTAB RRR,No Gallops,Rubs or new Murmurs, No Parasternal Heave +ve B.Sounds, Abd Soft, No tenderness, No organomegaly appriciated, No rebound - guarding or rigidity. No Cyanosis, Clubbing or edema, No new Rash or bruise      Data Review:    CBC  Recent Labs Lab 03/12/16 1321 03/13/16 0451 03/15/16 0503  WBC 10.2 8.2 7.0  HGB 12.0 10.5* 10.4*  HCT 38.0 34.0*  33.3*  PLT 307 247 277  MCV 90.9 90.9 90.2  MCH 28.7 28.1 28.2  MCHC 31.6 30.9 31.2  RDW 13.5 13.5 13.1    Chemistries   Recent Labs Lab 03/12/16 1321 03/13/16 0451 03/15/16 0503  NA 139 140 140  K 3.3* 3.6 3.6  CL 105 107 104  CO2 26 27 27   GLUCOSE 104* 95 97  BUN 7 <5* <5*  CREATININE 0.71 0.63 0.49  CALCIUM 8.9 8.6* 8.5*  AST 11*  --   --   ALT 13*  --   --   ALKPHOS 77  --   --   BILITOT 0.7  --   --    ------------------------------------------------------------------------------------------------------------------ No results for input(s): CHOL, HDL, LDLCALC, TRIG, CHOLHDL, LDLDIRECT in the last 72 hours.  Lab Results  Component Value Date    HGBA1C 5.2 12/18/2015   ------------------------------------------------------------------------------------------------------------------ No results for input(s): TSH, T4TOTAL, T3FREE, THYROIDAB in the last 72 hours.  Invalid input(s): FREET3 ------------------------------------------------------------------------------------------------------------------ No results for input(s): VITAMINB12, FOLATE, FERRITIN, TIBC, IRON, RETICCTPCT in the last 72 hours.  Coagulation profile  Recent Labs Lab 03/13/16 0651  INR 1.23    No results for input(s): DDIMER in the last 72 hours.  Cardiac Enzymes No results for input(s): CKMB, TROPONINI, MYOGLOBIN in the last 168 hours.  Invalid input(s): CK ------------------------------------------------------------------------------------------------------------------ No results found for: BNP  Inpatient Medications  Scheduled Meds: . aspirin EC  81 mg Oral QODAY  . busPIRone  5 mg Oral TID  . feeding supplement (ENSURE ENLIVE)  237 mL Oral BID BM  . FLUoxetine  60 mg Oral Daily  . imipenem-cilastatin  500 mg Intravenous Q8H  . traZODone  200 mg Oral QHS   Continuous Infusions: . lactated ringers 100 mL/hr at 03/16/16 0956   PRN Meds:.acetaminophen **OR** acetaminophen, antiseptic oral rinse, clonazePAM, ketorolac **AND** metoCLOPramide (REGLAN) injection **AND** diphenhydrAMINE (BENADRYL) IVPB(SICKLE CELL ONLY), HYDROmorphone (DILAUDID) injection, ondansetron **OR** ondansetron (ZOFRAN) IV, oxyCODONE-acetaminophen **AND** oxyCODONE  Micro Results Recent Results (from the past 240 hour(s))  Aerobic/Anaerobic Culture (surgical/deep wound)     Status: None (Preliminary result)   Collection Time: 03/13/16  2:44 PM  Result Value Ref Range Status   Specimen Description ABSCESS ABDOMEN  Final   Special Requests NONE  Final   Gram Stain   Final    ABUNDANT WBC PRESENT, PREDOMINANTLY PMN MODERATE GRAM NEGATIVE RODS RARE GRAM POSITIVE COCCI IN  PAIRS    Culture   Final    ABUNDANT ESCHERICHIA COLI Confirmed Extended Spectrum Beta-Lactamase Producer (ESBL)    Report Status PENDING  Incomplete   Organism ID, Bacteria ESCHERICHIA COLI  Final      Susceptibility   Escherichia coli - MIC*    AMPICILLIN >=32 RESISTANT Resistant     CEFAZOLIN >=64 RESISTANT Resistant     CEFEPIME <=1 RESISTANT Resistant     CEFTAZIDIME >=64 RESISTANT Resistant     CEFTRIAXONE >=64 RESISTANT Resistant     CIPROFLOXACIN >=4 RESISTANT Resistant     GENTAMICIN 4 SENSITIVE Sensitive     IMIPENEM <=0.25 SENSITIVE Sensitive     TRIMETH/SULFA <=20 SENSITIVE Sensitive     AMPICILLIN/SULBACTAM >=32 RESISTANT Resistant     PIP/TAZO >=128 RESISTANT Resistant     Extended ESBL POSITIVE Resistant     * ABUNDANT ESCHERICHIA COLI    Radiology Reports Mr Abdomen W Wo Contrast  Result Date: 03/13/2016 CLINICAL DATA:  CT demonstrating a possible pancreatic head lesion. EXAM: MRI ABDOMEN WITHOUT AND WITH CONTRAST TECHNIQUE: Multiplanar multisequence MR  imaging of the abdomen was performed both before and after the administration of intravenous contrast. CONTRAST:  16mL MULTIHANCE GADOBENATE DIMEGLUMINE 529 MG/ML IV SOLN COMPARISON:  03/12/2016 CT FINDINGS: Lower chest: Bilateral breast implants. Bibasilar atelectasis. Normal heart size without pericardial or pleural effusion. Hepatobiliary: No focal liver lesion. Hepatomegaly at 21.4 cm. There is minimal left anterior hepatic lobe intrahepatic duct dilatation, of indeterminate etiology. Example image 17/ series 12003. Normal gallbladder, without biliary ductal dilatation. Pancreas: No pancreatic duct dilatation. There is a variant of pancreas divisum, with a prominent dorsal duct entering the duodenum on image 26/ series 3. Communication with the common duct is identified. The CT abnormality was likely secondary to the focal fat deposition within the pancreas. Example image 51/series 9. No acute pancreatitis. Spleen:  Normal in size, without focal abnormality. Adrenals/Urinary Tract: Normal adrenal glands. Bilateral renal cysts. No hydronephrosis. Stomach/Bowel: Normal stomach and abdominal bowel loops. Vascular/Lymphatic: Normal caliber of the aorta and branch vessels. No retroperitoneal or retrocrural adenopathy. Other: No ascites. Musculoskeletal: No acute osseous abnormality. IMPRESSION: 1. The CT abnormality represents focal fat deposition within the pancreatic head. No suspicious pancreatic lesion. 2.  No acute abdominal process. 3. Variant of pancreas divisum.  No acute pancreatitis. Electronically Signed   By: Abigail Miyamoto M.D.   On: 03/13/2016 08:51   Ct Abdomen Pelvis W Contrast  Result Date: 03/12/2016 CLINICAL DATA:  Left lower quadrant pain for 1 week with nausea vomiting and diarrhea. Fever. EXAM: CT ABDOMEN AND PELVIS WITH CONTRAST TECHNIQUE: Multidetector CT imaging of the abdomen and pelvis was performed using the standard protocol following bolus administration of intravenous contrast. CONTRAST:  16mL ISOVUE-300 IOPAMIDOL (ISOVUE-300) INJECTION 61% COMPARISON:  10/28/2013 FINDINGS: Lower chest: Dependent atelectasis seen in the lower lobes bilaterally. Hepatobiliary: No focal abnormality within the liver parenchyma. There is no evidence for gallstones, gallbladder wall thickening, or pericholecystic fluid. No intrahepatic or extrahepatic biliary dilation. Pancreas: Ill-defined hypo enhancing soft tissue in the anterior pancreatic head is associated with some mild distention of the main pancreatic duct in the same region. Spleen: No splenomegaly. No focal mass lesion. Adrenals/Urinary Tract: No adrenal nodule or mass. Early excretion of contrast noted in the collecting systems bilaterally. Tiny cysts identified upper pole left kidney. No enhancing lesion identified in either kidney. No evidence for hydroureter. Mild circumferential bladder wall thickening appears slightly out of proportion to the degree of  decompression. Stomach/Bowel: Stomach is nondistended. No gastric wall thickening. No evidence of outlet obstruction. Duodenum is normally positioned as is the ligament of Treitz. No small bowel wall thickening. No small bowel dilatation. The terminal ileum is normal. The appendix is not visualized, but there is no edema or inflammation in the region of the cecum. Diverticular changes are noted in the left colon. In the proximal sigmoid segment, there is colonic wall thickening with pericolonic edema/inflammation and a focal relatively thick walled collection of fluid and gas measuring 4.2 x 3.2 x 5.3 cm. Vascular/Lymphatic: No abdominal aortic aneurysm. There is no gastrohepatic or hepatoduodenal ligament lymphadenopathy. No intraperitoneal or retroperitoneal lymphadenopathy. No pelvic sidewall lymphadenopathy. Reproductive: Uterus is surgically absent. There is no adnexal mass. Other: Trace intraperitoneal free fluid noted in the cul-de-sac. Musculoskeletal: Bone windows reveal no worrisome lytic or sclerotic osseous lesions. IMPRESSION: 1. Proximal sigmoid diverticulitis with contained para colonic abscess as above. This is contiguous with the urinary bladder which may account for the mild circumferential bladder wall thickening. 2. Ill-defined low-attenuation in the head of the pancreas with dilatation of the main  pancreatic duct. Adenocarcinoma cannot be excluded. MRI of the abdomen without and with contrast is recommended to further evaluate. 3. Trace free fluid in the cul-de-sac. Electronically Signed   By: Misty Stanley M.D.   On: 03/12/2016 20:32   Mr 3d Recon At Scanner  Result Date: 03/13/2016 CLINICAL DATA:  CT demonstrating a possible pancreatic head lesion. EXAM: MRI ABDOMEN WITHOUT AND WITH CONTRAST TECHNIQUE: Multiplanar multisequence MR imaging of the abdomen was performed both before and after the administration of intravenous contrast. CONTRAST:  70mL MULTIHANCE GADOBENATE DIMEGLUMINE 529  MG/ML IV SOLN COMPARISON:  03/12/2016 CT FINDINGS: Lower chest: Bilateral breast implants. Bibasilar atelectasis. Normal heart size without pericardial or pleural effusion. Hepatobiliary: No focal liver lesion. Hepatomegaly at 21.4 cm. There is minimal left anterior hepatic lobe intrahepatic duct dilatation, of indeterminate etiology. Example image 17/ series 12003. Normal gallbladder, without biliary ductal dilatation. Pancreas: No pancreatic duct dilatation. There is a variant of pancreas divisum, with a prominent dorsal duct entering the duodenum on image 26/ series 3. Communication with the common duct is identified. The CT abnormality was likely secondary to the focal fat deposition within the pancreas. Example image 51/series 9. No acute pancreatitis. Spleen: Normal in size, without focal abnormality. Adrenals/Urinary Tract: Normal adrenal glands. Bilateral renal cysts. No hydronephrosis. Stomach/Bowel: Normal stomach and abdominal bowel loops. Vascular/Lymphatic: Normal caliber of the aorta and branch vessels. No retroperitoneal or retrocrural adenopathy. Other: No ascites. Musculoskeletal: No acute osseous abnormality. IMPRESSION: 1. The CT abnormality represents focal fat deposition within the pancreatic head. No suspicious pancreatic lesion. 2.  No acute abdominal process. 3. Variant of pancreas divisum.  No acute pancreatitis. Electronically Signed   By: Abigail Miyamoto M.D.   On: 03/13/2016 08:51   Ct Image Guided Drainage By Percutaneous Catheter  Result Date: 03/13/2016 INDICATION: 65 year old female with a history of diverticular abscess EXAM: CT GUIDED DRAINAGE OF  ABSCESS MEDICATIONS: The patient is currently admitted to the hospital and receiving intravenous antibiotics. The antibiotics were administered within an appropriate time frame prior to the initiation of the procedure. ANESTHESIA/SEDATION: 2.0 mg IV Versed 100 mcg IV Fentanyl Moderate Sedation Time:  10 The patient was continuously  monitored during the procedure by the interventional radiology nurse under my direct supervision. COMPLICATIONS: None TECHNIQUE: Informed written consent was obtained from the patient after a thorough discussion of the procedural risks, benefits and alternatives. All questions were addressed. Maximal Sterile Barrier Technique was utilized including caps, mask, sterile gowns, sterile gloves, sterile drape, hand hygiene and skin antiseptic. A timeout was performed prior to the initiation of the procedure. PROCEDURE: Patient was positioned supine position on the CT gantry table. Scout CT was performed for planning purposes. Patient was then prepped and draped in the usual sterile fashion. The skin and subcutaneous tissues overlying the lower abdominal abscess collection was generously infiltrated 1% lidocaine for local anesthesia. Using CT guidance, a guide needle was advanced into the abscess. Once we confirmed location the needle, modified Seldinger technique was used to place a 12 French pigtail catheter drain. Approximately 10 cc - 15 cc of purulent fluid was aspirated for a sample to be sent to the lab. Catheter was sutured in position attached to gravity drainage. Patient tolerated the procedure well and remained hemodynamically stable throughout. No complications were encountered and no significant blood loss encountered. FINDINGS: Fluid and gas collection overlying the sigmoid colon, similar to the prior CT study. Inflammatory changes present. Status post drain placement there is collapse of the gas collection. IMPRESSION:  Status post CT-guided drainage of abdominal abscess secondary to diverticulitis. Twelve french drain attached to gravity drainage with sample sent to the lab for analysis. Signed, Dulcy Fanny. Earleen Newport, DO [facility] Electronically Signed   By: Corrie Mckusick D.O.   On: 03/13/2016 18:25    Time Spent in minutes  30   Jani Gravel M.D on  03/16/2016 at 10:59 AM  Between 7am to 7pm - Pager - [phone]  After 7pm go to [email or web address] - password [facility]  [facility] -  Office  [phone]

## 2016-03-16 NOTE — Progress Notes (Signed)
Patient ID: Dorothy White, female   DOB: Mar 12, 1951, 65 y.o.   MRN: [id number]    Referring Physician(s): Ghimire,S/CCS  Supervising Physician: Aletta Edouard  Patient Status:  Inpatient  Chief Complaint:  Pelvic abscess  Subjective:  Pt doing ok; still has some soreness at pelvic drain LLQ; occ nausea, no vomiting  Allergies: Penicillins  Medications: Prior to Admission medications   Medication Sig Start Date End Date Taking? Authorizing Provider  Ascorbic Acid (VITAMIN C PO) Take 1 tablet by mouth every evening.   Yes Historical Provider, MD  aspirin EC 81 MG tablet Take 81 mg by mouth every other day.   Yes Historical Provider, MD  busPIRone (BUSPAR) 5 MG tablet Take 1 tablet by mouth 3 (three) times daily. 02/12/16  Yes Historical Provider, MD  calcium-vitamin D (OSCAL WITH D) 500-200 MG-UNIT per tablet Take 1 tablet by mouth daily with breakfast.   Yes Historical Provider, MD  Cholecalciferol (VITAMIN D PO) Take 1 capsule by mouth every evening.   Yes Historical Provider, MD  ciprofloxacin (CIPRO) 500 MG tablet Take 1 tablet by mouth 2 (two) times daily. 03/07/16  Yes Historical Provider, MD  clonazePAM (KLONOPIN) 1 MG tablet Take 1 tablet (1 mg total) by mouth daily as needed (anxiety/sleep.). Patient taking differently: Take 1 mg by mouth 2 (two) times daily as needed for anxiety (anxiety/sleep.).  02/15/16  Yes Dorothy Spark, MD  FLUoxetine (PROZAC) 20 MG capsule Take 60 mg by mouth daily.    Yes Historical Provider, MD  levocetirizine (XYZAL ALLERGY 24HR) 5 MG tablet Take 5 mg by mouth every evening.   Yes Historical Provider, MD  LUTEIN PO Take 1 capsule by mouth every evening.   Yes Historical Provider, MD  mometasone (NASONEX) 50 MCG/ACT nasal spray Place 2 sprays into the nose daily.   Yes Historical Provider, MD  Multiple Vitamins-Minerals (ONE-A-DAY WOMENS 50 PLUS PO) Take 1 tablet by mouth every evening.   Yes Historical Provider, MD  oxyCODONE-acetaminophen  (PERCOCET) 10-325 MG tablet Take 1 tablet by mouth 4 (four) times daily as needed. 02/01/16  Yes Historical Provider, MD  RELPAX 40 MG tablet Take 1 tablet by mouth as needed for migraine and may take 1 additional tablet in 2 hours if no relief 01/16/16  Yes Historical Provider, MD  traZODone (DESYREL) 100 MG tablet Take 200 mg by mouth at bedtime.    Yes Historical Provider, MD  vitamin E 100 UNIT capsule Take 100 Units by mouth every evening.   Yes Historical Provider, MD     Vital Signs: BP (!) 115/55 (BP Location: Left Arm)   Pulse 63   Temp 98.2 F (36.8 C) (Oral)   Resp 18   Ht 5\' 5"  (1.651 m)   Wt 126 lb 15.8 oz (57.6 kg)   SpO2 95%   BMI 21.13 kg/m   Physical Exam alert; LLQ drain intact, insertion site mildly tender/minimal erythema output minimal; cx's- e coli, multi resistant  Imaging: Mr Abdomen W Wo Contrast  Result Date: 03/13/2016 CLINICAL DATA:  CT demonstrating a possible pancreatic head lesion. EXAM: MRI ABDOMEN WITHOUT AND WITH CONTRAST TECHNIQUE: Multiplanar multisequence MR imaging of the abdomen was performed both before and after the administration of intravenous contrast. CONTRAST:  41mL MULTIHANCE GADOBENATE DIMEGLUMINE 529 MG/ML IV SOLN COMPARISON:  03/12/2016 CT FINDINGS: Lower chest: Bilateral breast implants. Bibasilar atelectasis. Normal heart size without pericardial or pleural effusion. Hepatobiliary: No focal liver lesion. Hepatomegaly at 21.4 cm. There is minimal left anterior hepatic  lobe intrahepatic duct dilatation, of indeterminate etiology. Example image 17/ series 12003. Normal gallbladder, without biliary ductal dilatation. Pancreas: No pancreatic duct dilatation. There is a variant of pancreas divisum, with a prominent dorsal duct entering the duodenum on image 26/ series 3. Communication with the common duct is identified. The CT abnormality was likely secondary to the focal fat deposition within the pancreas. Example image 51/series 9. No acute  pancreatitis. Spleen: Normal in size, without focal abnormality. Adrenals/Urinary Tract: Normal adrenal glands. Bilateral renal cysts. No hydronephrosis. Stomach/Bowel: Normal stomach and abdominal bowel loops. Vascular/Lymphatic: Normal caliber of the aorta and branch vessels. No retroperitoneal or retrocrural adenopathy. Other: No ascites. Musculoskeletal: No acute osseous abnormality. IMPRESSION: 1. The CT abnormality represents focal fat deposition within the pancreatic head. No suspicious pancreatic lesion. 2.  No acute abdominal process. 3. Variant of pancreas divisum.  No acute pancreatitis. Electronically Signed   By: Abigail Miyamoto M.D.   On: 03/13/2016 08:51   Ct Abdomen Pelvis W Contrast  Result Date: 03/12/2016 CLINICAL DATA:  Left lower quadrant pain for 1 week with nausea vomiting and diarrhea. Fever. EXAM: CT ABDOMEN AND PELVIS WITH CONTRAST TECHNIQUE: Multidetector CT imaging of the abdomen and pelvis was performed using the standard protocol following bolus administration of intravenous contrast. CONTRAST:  166mL ISOVUE-300 IOPAMIDOL (ISOVUE-300) INJECTION 61% COMPARISON:  10/28/2013 FINDINGS: Lower chest: Dependent atelectasis seen in the lower lobes bilaterally. Hepatobiliary: No focal abnormality within the liver parenchyma. There is no evidence for gallstones, gallbladder wall thickening, or pericholecystic fluid. No intrahepatic or extrahepatic biliary dilation. Pancreas: Ill-defined hypo enhancing soft tissue in the anterior pancreatic head is associated with some mild distention of the main pancreatic duct in the same region. Spleen: No splenomegaly. No focal mass lesion. Adrenals/Urinary Tract: No adrenal nodule or mass. Early excretion of contrast noted in the collecting systems bilaterally. Tiny cysts identified upper pole left kidney. No enhancing lesion identified in either kidney. No evidence for hydroureter. Mild circumferential bladder wall thickening appears slightly out of  proportion to the degree of decompression. Stomach/Bowel: Stomach is nondistended. No gastric wall thickening. No evidence of outlet obstruction. Duodenum is normally positioned as is the ligament of Treitz. No small bowel wall thickening. No small bowel dilatation. The terminal ileum is normal. The appendix is not visualized, but there is no edema or inflammation in the region of the cecum. Diverticular changes are noted in the left colon. In the proximal sigmoid segment, there is colonic wall thickening with pericolonic edema/inflammation and a focal relatively thick walled collection of fluid and gas measuring 4.2 x 3.2 x 5.3 cm. Vascular/Lymphatic: No abdominal aortic aneurysm. There is no gastrohepatic or hepatoduodenal ligament lymphadenopathy. No intraperitoneal or retroperitoneal lymphadenopathy. No pelvic sidewall lymphadenopathy. Reproductive: Uterus is surgically absent. There is no adnexal mass. Other: Trace intraperitoneal free fluid noted in the cul-de-sac. Musculoskeletal: Bone windows reveal no worrisome lytic or sclerotic osseous lesions. IMPRESSION: 1. Proximal sigmoid diverticulitis with contained para colonic abscess as above. This is contiguous with the urinary bladder which may account for the mild circumferential bladder wall thickening. 2. Ill-defined low-attenuation in the head of the pancreas with dilatation of the main pancreatic duct. Adenocarcinoma cannot be excluded. MRI of the abdomen without and with contrast is recommended to further evaluate. 3. Trace free fluid in the cul-de-sac. Electronically Signed   By: Misty Stanley M.D.   On: 03/12/2016 20:32   Mr 3d Recon At Scanner  Result Date: 03/13/2016 CLINICAL DATA:  CT demonstrating a possible pancreatic head  lesion. EXAM: MRI ABDOMEN WITHOUT AND WITH CONTRAST TECHNIQUE: Multiplanar multisequence MR imaging of the abdomen was performed both before and after the administration of intravenous contrast. CONTRAST:  33mL MULTIHANCE  GADOBENATE DIMEGLUMINE 529 MG/ML IV SOLN COMPARISON:  03/12/2016 CT FINDINGS: Lower chest: Bilateral breast implants. Bibasilar atelectasis. Normal heart size without pericardial or pleural effusion. Hepatobiliary: No focal liver lesion. Hepatomegaly at 21.4 cm. There is minimal left anterior hepatic lobe intrahepatic duct dilatation, of indeterminate etiology. Example image 17/ series 12003. Normal gallbladder, without biliary ductal dilatation. Pancreas: No pancreatic duct dilatation. There is a variant of pancreas divisum, with a prominent dorsal duct entering the duodenum on image 26/ series 3. Communication with the common duct is identified. The CT abnormality was likely secondary to the focal fat deposition within the pancreas. Example image 51/series 9. No acute pancreatitis. Spleen: Normal in size, without focal abnormality. Adrenals/Urinary Tract: Normal adrenal glands. Bilateral renal cysts. No hydronephrosis. Stomach/Bowel: Normal stomach and abdominal bowel loops. Vascular/Lymphatic: Normal caliber of the aorta and branch vessels. No retroperitoneal or retrocrural adenopathy. Other: No ascites. Musculoskeletal: No acute osseous abnormality. IMPRESSION: 1. The CT abnormality represents focal fat deposition within the pancreatic head. No suspicious pancreatic lesion. 2.  No acute abdominal process. 3. Variant of pancreas divisum.  No acute pancreatitis. Electronically Signed   By: Abigail Miyamoto M.D.   On: 03/13/2016 08:51   Ct Image Guided Drainage By Percutaneous Catheter  Result Date: 03/13/2016 INDICATION: 65 year old female with a history of diverticular abscess EXAM: CT GUIDED DRAINAGE OF  ABSCESS MEDICATIONS: The patient is currently admitted to the hospital and receiving intravenous antibiotics. The antibiotics were administered within an appropriate time frame prior to the initiation of the procedure. ANESTHESIA/SEDATION: 2.0 mg IV Versed 100 mcg IV Fentanyl Moderate Sedation Time:  10 The  patient was continuously monitored during the procedure by the interventional radiology nurse under my direct supervision. COMPLICATIONS: None TECHNIQUE: Informed written consent was obtained from the patient after a thorough discussion of the procedural risks, benefits and alternatives. All questions were addressed. Maximal Sterile Barrier Technique was utilized including caps, mask, sterile gowns, sterile gloves, sterile drape, hand hygiene and skin antiseptic. A timeout was performed prior to the initiation of the procedure. PROCEDURE: Patient was positioned supine position on the CT gantry table. Scout CT was performed for planning purposes. Patient was then prepped and draped in the usual sterile fashion. The skin and subcutaneous tissues overlying the lower abdominal abscess collection was generously infiltrated 1% lidocaine for local anesthesia. Using CT guidance, a guide needle was advanced into the abscess. Once we confirmed location the needle, modified Seldinger technique was used to place a 12 French pigtail catheter drain. Approximately 10 cc - 15 cc of purulent fluid was aspirated for a sample to be sent to the lab. Catheter was sutured in position attached to gravity drainage. Patient tolerated the procedure well and remained hemodynamically stable throughout. No complications were encountered and no significant blood loss encountered. FINDINGS: Fluid and gas collection overlying the sigmoid colon, similar to the prior CT study. Inflammatory changes present. Status post drain placement there is collapse of the gas collection. IMPRESSION: Status post CT-guided drainage of abdominal abscess secondary to diverticulitis. Twelve french drain attached to gravity drainage with sample sent to the lab for analysis. Signed, Dulcy Fanny. Earleen Newport, DO [facility] Electronically Signed   By: Corrie Mckusick D.O.   On: 03/13/2016 18:25    Labs:  CBC:  Recent  Labs  02/15/16 1536 03/12/16 1321 03/13/16 0451 03/15/16 0503  WBC 5.8 10.2 8.2 7.0  HGB 13.0 12.0 10.5* 10.4*  HCT 39.9 38.0 34.0* 33.3*  PLT 245 307 247 277    COAGS:  Recent Labs  12/17/15 2112 02/15/16 1536 03/13/16 0651  INR 1.03 1.0 1.23  APTT 23*  --  33    BMP:  Recent Labs  01/23/16 2017  02/15/16 1536 03/12/16 1321 03/13/16 0451 03/15/16 0503  NA 137  < > 145 139 140 140  K 3.6  < > 4.0 3.3* 3.6 3.6  CL 99*  < > 99 105 107 104  CO2 28  < > 36* 26 27 27   GLUCOSE 96  < > 77 104* 95 97  BUN 7  < > 6* 7 <5* <5*  CALCIUM 10.7*  < > 9.1 8.9 8.6* 8.5*  CREATININE 0.73  < > 0.95 0.71 0.63 0.49  GFRNONAA >60  --   --  >60 >60 >60  GFRAA >60  --   --  >60 >60 >60  < > = values in this interval not displayed.  LIVER FUNCTION TESTS:  Recent Labs  12/17/15 2112 12/18/15 0510 02/06/16 0906 03/12/16 1321  BILITOT 0.6 0.7 0.3 0.7  AST 18 14* 18 11*  ALT 18 16 16  13*  ALKPHOS 85 78 101 77  PROT 6.0* 5.8* 6.3 6.2*  ALBUMIN 3.5 3.1* 3.9 3.1*    Assessment and Plan: S/p LLQ diverticular abscess drain 8/17; AF; WBC nl; cx's- multi resistant e coli- antbx per pharm; cont drain irrigation tid; if no sig output over next 24-48 hrs obtain f/u CT; will need drain injection before removal   Electronically Signed: D. Rowe Robert 03/16/2016, 11:32 AM   I spent a total of 15 minutes at the the patient's bedside AND on the patient's hospital floor or unit, greater than 50% of which was counseling/coordinating care for pelvic abscess drain

## 2016-03-16 NOTE — Progress Notes (Signed)
Patient ID: Dorothy White, female   DOB: [birth year], 65 y.o.   MRN: [id number] [facility] Surgery Progress Note:   * No surgery found *  Subjective: Mental status is clear.  She has been more sore since irrigation of drain this morning Objective: Vital signs in last 24 hours: Temp:  [97.5 F (36.4 C)-99 F (37.2 C)] 98.2 F (36.8 C) (08/20 0930) Pulse Rate:  [62-71] 63 (08/20 0930) Resp:  [17-18] 18 (08/20 0930) BP: (97-119)/(40-57) 115/55 (08/20 0930) SpO2:  [93 %-98 %] 95 % (08/20 0930) Weight:  [57.6 kg (126 lb 15.8 oz)] 57.6 kg (126 lb 15.8 oz) (08/19 2133)  Intake/Output from previous day: 08/19 0701 - 08/20 0700 In: 2240 [P.O.:840; I.V.:1200; IV Piggyback:200] Out: 10 [Drains:10] Intake/Output this shift: Total I/O In: 120 [P.O.:120] Out: -   Physical Exam: Work of breathing is normal.  Perc drain in sigmoid abscess  Lab Results:  Results for orders placed or performed during the hospital encounter of 03/12/16 (from the past 48 hour(s))  CBC     Status: Abnormal   Collection Time: 03/15/16  5:03 AM  Result Value Ref Range   WBC 7.0 4.0 - 10.5 K/uL   RBC 3.69 (L) 3.87 - 5.11 MIL/uL   Hemoglobin 10.4 (L) 12.0 - 15.0 g/dL   HCT 69.2 (L) 23.0 - 09.7 %   MCV 90.2 78.0 - 100.0 fL   MCH 28.2 26.0 - 34.0 pg   MCHC 31.2 30.0 - 36.0 g/dL   RDW 94.9 97.1 - 82.0 %   Platelets 277 150 - 400 K/uL  Basic metabolic panel     Status: Abnormal   Collection Time: 03/15/16  5:03 AM  Result Value Ref Range   Sodium 140 135 - 145 mmol/L   Potassium 3.6 3.5 - 5.1 mmol/L   Chloride 104 101 - 111 mmol/L   CO2 27 22 - 32 mmol/L   Glucose, Bld 97 65 - 99 mg/dL   BUN <5 (L) 6 - 20 mg/dL   Creatinine, Ser 9.90 0.44 - 1.00 mg/dL   Calcium 8.5 (L) 8.9 - 10.3 mg/dL   GFR calc non Af Amer >60 >60 mL/min   GFR calc Af Amer >60 >60 mL/min    Comment: (NOTE) The eGFR has been calculated using the CKD EPI equation. This calculation has not been validated in all clinical  situations. eGFR's persistently <60 mL/min signify possible Chronic Kidney Disease.    Anion gap 9 5 - 15    Radiology/Results: No results found.  Anti-infectives: Anti-infectives    Start     Dose/Rate Route Frequency Ordered Stop   03/15/16 2200  imipenem-cilastatin (PRIMAXIN) 500 mg in sodium chloride 0.9 % 100 mL IVPB     500 mg 200 mL/hr over 30 Minutes Intravenous Every 8 hours 03/15/16 1810     03/13/16 2200  levofloxacin (LEVAQUIN) IVPB 750 mg  Status:  Discontinued     750 mg 100 mL/hr over 90 Minutes Intravenous Every 24 hours 03/12/16 2316 03/15/16 1810   03/13/16 0800  metroNIDAZOLE (FLAGYL) IVPB 500 mg  Status:  Discontinued     500 mg 100 mL/hr over 60 Minutes Intravenous Every 8 hours 03/13/16 0737 03/15/16 1810   03/12/16 2130  metroNIDAZOLE (FLAGYL) IVPB 500 mg     500 mg 100 mL/hr over 60 Minutes Intravenous  Once 03/12/16 2115 03/12/16 2228   03/12/16 2115  levofloxacin (LEVAQUIN) IVPB 750 mg     750 mg 100 mL/hr over 90 Minutes  Intravenous  Once 03/12/16 2115 03/12/16 2320      Assessment/Plan: Problem List: Patient Active Problem List   Diagnosis Date Noted  . Infection due to ESBL-producing Escherichia coli/Diverticular Abscess 03/15/2016  . Diverticulitis 03/12/2016  . Chronic pain syndrome 03/12/2016  . Lesion of pancreas 03/12/2016  . History of chest pain 03/12/2016  . Hypokalemia 03/12/2016  . Angina, class IV ([facility]) - chest tightness and pressure with dyspnea with minimal exertion 02/17/2016    Class: Question of  . TIA (transient ischemic attack) 12/26/2015  . DOE (dyspnea on exertion) 12/26/2015  . Right sided weakness 12/17/2015  . Ataxia 12/17/2015  . Shoulder fracture 02/15/2015  . Chronic headache 10/11/2013  . Weakness generalized 08/12/2013  . Dizziness and giddiness 08/12/2013  . Abnormality of gait 07/11/2013  . Hyperlipidemia 05/07/2007  . COMMON MIGRAINE 05/07/2007  . PREMATURE VENTRICULAR CONTRACTIONS, FREQUENT 05/07/2007   . GERD 05/07/2007  . DIVERTICULOSIS, COLON 05/07/2007  . 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 OSTEOPENIA 05/07/2007  . Anxiety and depression 03/24/2007  . HEMORRHOIDS 03/24/2007  . ALLERGIC RHINITIS 03/24/2007  . LOW BACK PAIN 03/24/2007  . MIGRAINES, HX OF 03/24/2007    Improved.  Will need followup CT this week.  * No surgery found *    LOS: 4 days   Matt B. Hassell Done, MD, [facility] [phone] beeper [phone]  03/16/2016 10:28 AM

## 2016-03-17 DIAGNOSIS — A498 Other bacterial infections of unspecified site: Secondary | ICD-10-CM

## 2016-03-17 DIAGNOSIS — Z1612 Extended spectrum beta lactamase (ESBL) resistance: Secondary | ICD-10-CM

## 2016-03-17 LAB — COMPREHENSIVE METABOLIC PANEL
ALT: 12 U/L — ABNORMAL LOW (ref 14–54)
ANION GAP: 6 (ref 5–15)
AST: 24 U/L (ref 15–41)
Albumin: 2.4 g/dL — ABNORMAL LOW (ref 3.5–5.0)
Alkaline Phosphatase: 60 U/L (ref 38–126)
BILIRUBIN TOTAL: 0.4 mg/dL (ref 0.3–1.2)
CHLORIDE: 104 mmol/L (ref 101–111)
CO2: 32 mmol/L (ref 22–32)
Calcium: 8.5 mg/dL — ABNORMAL LOW (ref 8.9–10.3)
Creatinine, Ser: 0.55 mg/dL (ref 0.44–1.00)
GFR calc Af Amer: >60 mL/min (ref >60)
Glucose, Bld: 104 mg/dL — ABNORMAL HIGH (ref 65–99)
POTASSIUM: 3.4 mmol/L — AB (ref 3.5–5.1)
Sodium: 142 mmol/L (ref 135–145)
TOTAL PROTEIN: 5.2 g/dL — AB (ref 6.5–8.1)

## 2016-03-17 LAB — MAGNESIUM: MAGNESIUM: 1.8 mg/dL (ref 1.7–2.4)

## 2016-03-17 LAB — CBC
HEMATOCRIT: 36.7 % (ref 36.0–46.0)
Hemoglobin: 11.4 g/dL — ABNORMAL LOW (ref 12.0–15.0)
MCH: 28.3 pg (ref 26.0–34.0)
MCHC: 31.1 g/dL (ref 30.0–36.0)
MCV: 91.1 fL (ref 78.0–100.0)
PLATELETS: 337 10*3/uL (ref 150–400)
RBC: 4.03 MIL/uL (ref 3.87–5.11)
RDW: 13.3 % (ref 11.5–15.5)
WBC: 5.9 10*3/uL (ref 4.0–10.5)

## 2016-03-17 MED ORDER — ENOXAPARIN SODIUM 40 MG/0.4ML ~~LOC~~ SOLN
40.0000 mg | SUBCUTANEOUS | Status: DC
  Filled 2016-03-17: qty 0.4

## 2016-03-17 NOTE — Progress Notes (Signed)
Progress Note: General Surgery Service   Subjective: Pain improving, but still present, minimal appetitie  Objective: Vital signs in last 24 hours: Temp:  [98 F (36.7 C)-98.9 F (37.2 C)] 98 F (36.7 C) (08/21 0434) Pulse Rate:  [63-68] 63 (08/21 0434) Resp:  [18] 18 (08/21 0434) BP: (115-125)/(45-54) 115/54 (08/21 0434) SpO2:  [94 %-95 %] 95 % (08/21 0434) Last BM Date: 03/15/16  Intake/Output from previous day: 08/20 0701 - 08/21 0700 In: 3425 [P.O.:720; I.V.:2395; IV Piggyback:300] Out: 6 [Drains:6] Intake/Output this shift: No intake/output data recorded.  Lungs: CTAB  Cardiovascular: RRR  Abd: soft, slight tenderness next to drain otherwise negative  Extremities: no edema  Neuro: AOx4  Lab Results: CBC   Recent Labs  03/15/16 0503  WBC 7.0  HGB 10.4*  HCT 33.3*  PLT 277   BMET  Recent Labs  03/15/16 0503  NA 140  K 3.6  CL 104  CO2 27  GLUCOSE 97  BUN <5*  CREATININE 0.49  CALCIUM 8.5*   PT/INR No results for input(s): LABPROT, INR in the last 72 hours. ABG No results for input(s): PHART, HCO3 in the last 72 hours.  Invalid input(s): PCO2, PO2  Studies/Results:  Anti-infectives: Anti-infectives    Start     Dose/Rate Route Frequency Ordered Stop   03/15/16 2200  imipenem-cilastatin (PRIMAXIN) 500 mg in sodium chloride 0.9 % 100 mL IVPB     500 mg 200 mL/hr over 30 Minutes Intravenous Every 8 hours 03/15/16 1810     03/13/16 2200  levofloxacin (LEVAQUIN) IVPB 750 mg  Status:  Discontinued     750 mg 100 mL/hr over 90 Minutes Intravenous Every 24 hours 03/12/16 2316 03/15/16 1810   03/13/16 0800  metroNIDAZOLE (FLAGYL) IVPB 500 mg  Status:  Discontinued     500 mg 100 mL/hr over 60 Minutes Intravenous Every 8 hours 03/13/16 0737 03/15/16 1810   03/12/16 2130  metroNIDAZOLE (FLAGYL) IVPB 500 mg     500 mg 100 mL/hr over 60 Minutes Intravenous  Once 03/12/16 2115 03/12/16 2228   03/12/16 2115  levofloxacin (LEVAQUIN) IVPB 750 mg      750 mg 100 mL/hr over 90 Minutes Intravenous  Once 03/12/16 2115 03/12/16 2320      Medications: Scheduled Meds: . aspirin EC  81 mg Oral QODAY  . busPIRone  5 mg Oral TID  . feeding supplement (ENSURE ENLIVE)  237 mL Oral BID BM  . FLUoxetine  60 mg Oral Daily  . imipenem-cilastatin  500 mg Intravenous Q8H  . traZODone  200 mg Oral QHS   Continuous Infusions: . lactated ringers 50 mL/hr at 03/17/16 0622   PRN Meds:.acetaminophen **OR** acetaminophen, antiseptic oral rinse, clonazePAM, ketorolac **AND** metoCLOPramide (REGLAN) injection **AND** diphenhydrAMINE (BENADRYL) IVPB(SICKLE CELL ONLY), HYDROmorphone (DILAUDID) injection, ondansetron **OR** ondansetron (ZOFRAN) IV, oxyCODONE-acetaminophen **AND** oxyCODONE  Assessment/Plan: Patient Active Problem List   Diagnosis Date Noted  . Infection due to ESBL-producing Escherichia coli/Diverticular Abscess 03/15/2016  . Diverticulitis 03/12/2016  . Chronic pain syndrome 03/12/2016  . Lesion of pancreas 03/12/2016  . History of chest pain 03/12/2016  . Hypokalemia 03/12/2016  . 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) - chest tightness and pressure with dyspnea with minimal exertion 02/17/2016    Class: Question of  . TIA (transient ischemic attack) 12/26/2015  . DOE (dyspnea on exertion) 12/26/2015  . Right sided weakness 12/17/2015  . Ataxia 12/17/2015  . Shoulder fracture 02/15/2015  . Chronic headache 10/11/2013  . Weakness generalized 08/12/2013  . Dizziness and  giddiness 08/12/2013  . Abnormality of gait 07/11/2013  . Hyperlipidemia 05/07/2007  . COMMON MIGRAINE 05/07/2007  . PREMATURE VENTRICULAR CONTRACTIONS, FREQUENT 05/07/2007  . GERD 05/07/2007  . DIVERTICULOSIS, COLON 05/07/2007  . 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 OSTEOPENIA 05/07/2007  . Anxiety and depression 03/24/2007  . HEMORRHOIDS 03/24/2007  . ALLERGIC RHINITIS 03/24/2007  . LOW BACK PAIN 03/24/2007  . MIGRAINES, HX OF 03/24/2007   65 yo female with  diverticulitis with drain -continue abx -advance to soft diet   LOS: 5 days   Mickeal Skinner, MD Pg# [phone] [facility]

## 2016-03-17 NOTE — Progress Notes (Signed)
Referring Physician(s): Dr Gurney Maxin  Supervising Physician: Corrie Mckusick  Patient Status:  Inpatient  Chief Complaint:  LLQ abscess drain Placed 8/17  Subjective:  Better daily +Ecoli Output minimal  Allergies: Penicillins  Medications: Prior to Admission medications   Medication Sig Start Date End Date Taking? Authorizing Provider  Ascorbic Acid (VITAMIN C PO) Take 1 tablet by mouth every evening.   Yes Historical Provider, MD  aspirin EC 81 MG tablet Take 81 mg by mouth every other day.   Yes Historical Provider, MD  busPIRone (BUSPAR) 5 MG tablet Take 1 tablet by mouth 3 (three) times daily. 02/12/16  Yes Historical Provider, MD  calcium-vitamin D (OSCAL WITH D) 500-200 MG-UNIT per tablet Take 1 tablet by mouth daily with breakfast.   Yes Historical Provider, MD  Cholecalciferol (VITAMIN D PO) Take 1 capsule by mouth every evening.   Yes Historical Provider, MD  ciprofloxacin (CIPRO) 500 MG tablet Take 1 tablet by mouth 2 (two) times daily. 03/07/16  Yes Historical Provider, MD  clonazePAM (KLONOPIN) 1 MG tablet Take 1 tablet (1 mg total) by mouth daily as needed (anxiety/sleep.). Patient taking differently: Take 1 mg by mouth 2 (two) times daily as needed for anxiety (anxiety/sleep.).  02/15/16  Yes Dorothy Spark, MD  FLUoxetine (PROZAC) 20 MG capsule Take 60 mg by mouth daily.    Yes Historical Provider, MD  levocetirizine (XYZAL ALLERGY 24HR) 5 MG tablet Take 5 mg by mouth every evening.   Yes Historical Provider, MD  LUTEIN PO Take 1 capsule by mouth every evening.   Yes Historical Provider, MD  mometasone (NASONEX) 50 MCG/ACT nasal spray Place 2 sprays into the nose daily.   Yes Historical Provider, MD  Multiple Vitamins-Minerals (ONE-A-DAY WOMENS 50 PLUS PO) Take 1 tablet by mouth every evening.   Yes Historical Provider, MD  oxyCODONE-acetaminophen (PERCOCET) 10-325 MG tablet Take 1 tablet by mouth 4 (four) times daily as needed. 02/01/16  Yes Historical  Provider, MD  RELPAX 40 MG tablet Take 1 tablet by mouth as needed for migraine and may take 1 additional tablet in 2 hours if no relief 01/16/16  Yes Historical Provider, MD  traZODone (DESYREL) 100 MG tablet Take 200 mg by mouth at bedtime.    Yes Historical Provider, MD  vitamin E 100 UNIT capsule Take 100 Units by mouth every evening.   Yes Historical Provider, MD     Vital Signs: BP (!) 115/54 (BP Location: Left Arm)   Pulse 63   Temp 98 F (36.7 C) (Oral)   Resp 18   Ht 5\' 5"  (1.651 m)   Wt 126 lb 15.8 oz (57.6 kg)   SpO2 95%   BMI 21.13 kg/m   Physical Exam  Constitutional: She is oriented to person, place, and time.  Abdominal: Soft.  Musculoskeletal: Normal range of motion.  Neurological: She is alert and oriented to person, place, and time.  Skin: Skin is warm and dry.  Site is clean and dry Sl tender No sign of infection Output serous color Less than 10 cc daily  +Ecoli  Nursing note and vitals reviewed.   Imaging: Ct Image Guided Drainage By Percutaneous Catheter  Result Date: 03/13/2016 INDICATION: 65 year old female with a history of diverticular abscess EXAM: CT GUIDED DRAINAGE OF  ABSCESS MEDICATIONS: The patient is currently admitted to the hospital and receiving intravenous antibiotics. The antibiotics were administered within an appropriate time frame prior to the initiation of the procedure. ANESTHESIA/SEDATION: 2.0 mg IV Versed  100 mcg IV Fentanyl Moderate Sedation Time:  10 The patient was continuously monitored during the procedure by the interventional radiology nurse under my direct supervision. COMPLICATIONS: None TECHNIQUE: Informed written consent was obtained from the patient after a thorough discussion of the procedural risks, benefits and alternatives. All questions were addressed. Maximal Sterile Barrier Technique was utilized including caps, mask, sterile gowns, sterile gloves, sterile drape, hand hygiene and skin antiseptic. A timeout was  performed prior to the initiation of the procedure. PROCEDURE: Patient was positioned supine position on the CT gantry table. Scout CT was performed for planning purposes. Patient was then prepped and draped in the usual sterile fashion. The skin and subcutaneous tissues overlying the lower abdominal abscess collection was generously infiltrated 1% lidocaine for local anesthesia. Using CT guidance, a guide needle was advanced into the abscess. Once we confirmed location the needle, modified Seldinger technique was used to place a 12 French pigtail catheter drain. Approximately 10 cc - 15 cc of purulent fluid was aspirated for a sample to be sent to the lab. Catheter was sutured in position attached to gravity drainage. Patient tolerated the procedure well and remained hemodynamically stable throughout. No complications were encountered and no significant blood loss encountered. FINDINGS: Fluid and gas collection overlying the sigmoid colon, similar to the prior CT study. Inflammatory changes present. Status post drain placement there is collapse of the gas collection. IMPRESSION: Status post CT-guided drainage of abdominal abscess secondary to diverticulitis. Twelve french drain attached to gravity drainage with sample sent to the lab for analysis. Signed, Dulcy Fanny. Earleen Newport, DO [facility] Radiology Electronically Signed   By: Corrie Mckusick D.O.   On: 03/13/2016 18:25    Labs:  CBC:  Recent Labs  03/12/16 1321 03/13/16 0451 03/15/16 0503 03/17/16 1105  WBC 10.2 8.2 7.0 5.9  HGB 12.0 10.5* 10.4* 11.4*  HCT 38.0 34.0* 33.3* 36.7  PLT 307 247 277 337    COAGS:  Recent Labs  12/17/15 2112 02/15/16 1536 03/13/16 0651  INR 1.03 1.0 1.23  APTT 23*  --  33    BMP:  Recent Labs  03/12/16 1321 03/13/16 0451 03/15/16 0503 03/17/16 1105  NA 139 140 140 142  K 3.3* 3.6 3.6 3.4*  CL 105 107 104 104  CO2 26 27 27  32  GLUCOSE 104* 95 97 104*    BUN 7 <5* <5* <5*  CALCIUM 8.9 8.6* 8.5* 8.5*  CREATININE 0.71 0.63 0.49 0.55  GFRNONAA >60 >60 >60 >60  GFRAA >60 >60 >60 >60    LIVER FUNCTION TESTS:  Recent Labs  12/18/15 0510 02/06/16 0906 03/12/16 1321 03/17/16 1105  BILITOT 0.7 0.3 0.7 0.4  AST 14* 18 11* 24  ALT 16 16 13* 12*  ALKPHOS 78 101 77 60  PROT 5.8* 6.3 6.2* 5.2*  ALBUMIN 3.1* 3.9 3.1* 2.4*    Assessment and Plan:  LLQ abscess drain Will follow Plan per CCM Will need re imaging soon Will need drain injection before removal  Electronically Signed: Paytyn Mesta A 03/17/2016, 1:12 PM   I spent a total of 15 Minutes at the the patient's bedside AND on the patient's hospital floor or unit, greater than 50% of which was counseling/coordinating care for LLQ abscess drain

## 2016-03-17 NOTE — Progress Notes (Signed)
Discussed PICC placement with patient including risks, benefits, and alternatives. Consent obtained. Requested placement in AM of 8-22. Primary RN notified.   Marcello Moores, Elmor Kost L

## 2016-03-17 NOTE — Progress Notes (Signed)
PROGRESS NOTE                                                                                                                                                                                                             Patient Demographics:    Dorothy White, is a 65 y.o. female, DOB - 08/16/1950, [id number]  Admit date - 03/12/2016   Admitting Physician Karmen Bongo, MD  Outpatient Primary MD for the patient is WEBB, [name], MD  LOS - 5  Chief Complaint  Patient presents with  . Abdominal Pain       Brief Narrative    65 y.o.femalewith history of nonobstructive CAD (LHC July 2017 showed 25% stenosis at the mid LAD) GERD, depression who presented for evaluation of left lower abdominal pain. Further evaluation revealed sigmoid diverticulitis with a abscess. Gen. surgery was consulted, subsequently underwent CT-guided placement of a pigtail drain on 8/17. Was on empiric levofloxacin and Flagyl , now swithed to Imipenem on 03/15/16. See below for further details. Aspirate from drainage obtained on 03/13/2016 is growing ESBL Escherichia coli, antibiotics changed to imipenem   Subjective:    [name] today has, No headache, No chest pain, Mild left lower quadrant abdominal pain - No Nausea, No new weakness tingling or numbness, No Cough - SOB.    Assessment  & Plan :    1)Sigmoid diverticulitis with paracolic abscess: S/p CT-guided pigtail drain placement on 8/17.No significant drainage in the last 12 hours in her bag, tolerating liquid diet well since 03/14/2016. General surgery and interventional radiology consult appreciated. Aspirate from drainage obtained on 03/13/2016 is growing ESBL Escherichia coli, antibiotics changed to imipenem 03/15/2016, discussed with ID physician Dr. Johnnye Sima on 03/17/2016, place PICC line for 2 weeks of IV Primaxin based on culture sensitivity. If better we'll discharge home in 1-2  days with outpatient IR and surgery follow-up.     2)Nonobstructive CR:[id number] LHC July 2017 showed 25% stenosis at the mid LAD is an, stable, asymptomatic, c/n aspirin.  3)Possible Pancreatic lesionseen on CT scan:MRI of the abdomen shows focal fat deposition within the pancreatic head without any suspicious pancreatic lesions-no further workup plannedat this time   4)Anxiety/depression: Stable, continue with BuSpar, Prozac and trazodone.    Family Communication  : None present  Code Status :  DO NOT RESUSCITATE  Diet :  Soft  Disposition Plan  :  Home likely in the morning  Consults  :  IR, surgery  Procedures  :     CT abdomen and pelvis with proximal sigmoid diverticulitis with abscess, questionable pancreatic abnormality ruled out on MRI abdomen.  IR placed left lower quadrant diverticular abscess drain on 03/13/2016    DVT Prophylaxis  :  Lovenox   Lab Results  Component Value Date   PLT 277 03/15/2016    Inpatient Medications  Scheduled Meds: . aspirin EC  81 mg Oral QODAY  . busPIRone  5 mg Oral TID  . feeding supplement (ENSURE ENLIVE)  237 mL Oral BID BM  . FLUoxetine  60 mg Oral Daily  . imipenem-cilastatin  500 mg Intravenous Q8H  . traZODone  200 mg Oral QHS   Continuous Infusions: . lactated ringers 50 mL/hr at 03/17/16 0622   PRN Meds:.acetaminophen **OR** acetaminophen, antiseptic oral rinse, clonazePAM, ketorolac **AND** metoCLOPramide (REGLAN) injection **AND** diphenhydrAMINE (BENADRYL) IVPB(SICKLE CELL ONLY), HYDROmorphone (DILAUDID) injection, ondansetron **OR** ondansetron (ZOFRAN) IV, oxyCODONE-acetaminophen **AND** oxyCODONE  Antibiotics  :    Anti-infectives    Start     Dose/Rate Route Frequency Ordered Stop   03/15/16 2200  imipenem-cilastatin (PRIMAXIN) 500 mg in sodium chloride 0.9 % 100 mL IVPB     500 mg 200 mL/hr over 30 Minutes Intravenous Every 8 hours 03/15/16 1810     03/13/16 2200  levofloxacin (LEVAQUIN) IVPB 750  mg  Status:  Discontinued     750 mg 100 mL/hr over 90 Minutes Intravenous Every 24 hours 03/12/16 2316 03/15/16 1810   03/13/16 0800  metroNIDAZOLE (FLAGYL) IVPB 500 mg  Status:  Discontinued     500 mg 100 mL/hr over 60 Minutes Intravenous Every 8 hours 03/13/16 0737 03/15/16 1810   03/12/16 2130  metroNIDAZOLE (FLAGYL) IVPB 500 mg     500 mg 100 mL/hr over 60 Minutes Intravenous  Once 03/12/16 2115 03/12/16 2228   03/12/16 2115  levofloxacin (LEVAQUIN) IVPB 750 mg     750 mg 100 mL/hr over 90 Minutes Intravenous  Once 03/12/16 2115 03/12/16 2320         Objective:   Vitals:   03/16/16 0434 03/16/16 0930 03/16/16 2118 03/17/16 0434  BP: (!) 119/40 (!) 115/55 (!) 125/45 (!) 115/54  Pulse: 71 63 68 63  Resp: 18 18 18 18   Temp: 97.5 F (36.4 C) 98.2 F (36.8 C) 98.9 F (37.2 C) 98 F (36.7 C)  TempSrc: Oral Oral Oral Oral  SpO2: 93% 95% 94% 95%  Weight:      Height:        Wt Readings from Last 3 Encounters:  03/15/16 57.6 kg (126 lb 15.8 oz)  02/21/16 57.2 kg (126 lb 3.2 oz)  02/15/16 57.6 kg (127 lb)     Intake/Output Summary (Last 24 hours) at 03/17/16 1112 Last data filed at 03/17/16 0600  Gross per 24 hour  Intake             2905 ml  Output                6 ml  Net             2899 ml     Physical Exam  Awake Alert, Oriented X 3, No new F.N deficits, Normal affect [place].AT,PERRAL Supple Neck,No JVD, No cervical lymphadenopathy appriciated.  Symmetrical Chest wall movement, Good air movement bilaterally, CTAB RRR,No Gallops,Rubs or new Murmurs, No Parasternal Heave +ve B.Sounds, Abd  Soft, No tenderness, No organomegaly appriciated, No rebound - guarding or rigidity. No Cyanosis, Clubbing or edema, No new Rash or bruise       Data Review:    CBC  Recent Labs Lab 03/12/16 1321 03/13/16 0451 03/15/16 0503  WBC 10.2 8.2 7.0  HGB 12.0 10.5* 10.4*  HCT 38.0 34.0* 33.3*  PLT 307 247 277  MCV 90.9 90.9 90.2  MCH 28.7 28.1 28.2  MCHC 31.6 30.9 31.2   RDW 13.5 13.5 13.1    Chemistries   Recent Labs Lab 03/12/16 1321 03/13/16 0451 03/15/16 0503  NA 139 140 140  K 3.3* 3.6 3.6  CL 105 107 104  CO2 26 27 27   GLUCOSE 104* 95 97  BUN 7 <5* <5*  CREATININE 0.71 0.63 0.49  CALCIUM 8.9 8.6* 8.5*  AST 11*  --   --   ALT 13*  --   --   ALKPHOS 77  --   --   BILITOT 0.7  --   --    ------------------------------------------------------------------------------------------------------------------ No results for input(s): CHOL, HDL, LDLCALC, TRIG, CHOLHDL, LDLDIRECT in the last 72 hours.  Lab Results  Component Value Date   HGBA1C 5.2 12/18/2015   ------------------------------------------------------------------------------------------------------------------ No results for input(s): TSH, T4TOTAL, T3FREE, THYROIDAB in the last 72 hours.  Invalid input(s): FREET3 ------------------------------------------------------------------------------------------------------------------ No results for input(s): VITAMINB12, FOLATE, FERRITIN, TIBC, IRON, RETICCTPCT in the last 72 hours.  Coagulation profile  Recent Labs Lab 03/13/16 0651  INR 1.23    No results for input(s): DDIMER in the last 72 hours.  Cardiac Enzymes No results for input(s): CKMB, TROPONINI, MYOGLOBIN in the last 168 hours.  Invalid input(s): CK ------------------------------------------------------------------------------------------------------------------ No results found for: BNP  Micro Results Recent Results (from the past 240 hour(s))  Aerobic/Anaerobic Culture (surgical/deep wound)     Status: None (Preliminary result)   Collection Time: 03/13/16  2:44 PM  Result Value Ref Range Status   Specimen Description ABSCESS ABDOMEN  Final   Special Requests NONE  Final   Gram Stain   Final    ABUNDANT WBC PRESENT, PREDOMINANTLY PMN MODERATE GRAM NEGATIVE RODS RARE GRAM POSITIVE COCCI IN PAIRS    Culture   Final    ABUNDANT ESCHERICHIA COLI Confirmed  Extended Spectrum Beta-Lactamase Producer (ESBL) NO ANAEROBES ISOLATED; CULTURE IN PROGRESS FOR 5 DAYS    Report Status PENDING  Incomplete   Organism ID, Bacteria ESCHERICHIA COLI  Final      Susceptibility   Escherichia coli - MIC*    AMPICILLIN >=32 RESISTANT Resistant     CEFAZOLIN >=64 RESISTANT Resistant     CEFEPIME <=1 RESISTANT Resistant     CEFTAZIDIME >=64 RESISTANT Resistant     CEFTRIAXONE >=64 RESISTANT Resistant     CIPROFLOXACIN >=4 RESISTANT Resistant     GENTAMICIN 4 SENSITIVE Sensitive     IMIPENEM <=0.25 SENSITIVE Sensitive     TRIMETH/SULFA <=20 SENSITIVE Sensitive     AMPICILLIN/SULBACTAM >=32 RESISTANT Resistant     PIP/TAZO >=128 RESISTANT Resistant     Extended ESBL POSITIVE Resistant     * ABUNDANT ESCHERICHIA COLI    Radiology Reports Mr Abdomen W Wo Contrast  Result Date: 03/13/2016 CLINICAL DATA:  CT demonstrating a possible pancreatic head lesion. EXAM: MRI ABDOMEN WITHOUT AND WITH CONTRAST TECHNIQUE: Multiplanar multisequence MR imaging of the abdomen was performed both before and after the administration of intravenous contrast. CONTRAST:  23mL MULTIHANCE GADOBENATE DIMEGLUMINE 529 MG/ML IV SOLN COMPARISON:  03/12/2016 CT FINDINGS: Lower chest: Bilateral breast implants.  Bibasilar atelectasis. Normal heart size without pericardial or pleural effusion. Hepatobiliary: No focal liver lesion. Hepatomegaly at 21.4 cm. There is minimal left anterior hepatic lobe intrahepatic duct dilatation, of indeterminate etiology. Example image 17/ series 12003. Normal gallbladder, without biliary ductal dilatation. Pancreas: No pancreatic duct dilatation. There is a variant of pancreas divisum, with a prominent dorsal duct entering the duodenum on image 26/ series 3. Communication with the common duct is identified. The CT abnormality was likely secondary to the focal fat deposition within the pancreas. Example image 51/series 9. No acute pancreatitis. Spleen: Normal in size,  without focal abnormality. Adrenals/Urinary Tract: Normal adrenal glands. Bilateral renal cysts. No hydronephrosis. Stomach/Bowel: Normal stomach and abdominal bowel loops. Vascular/Lymphatic: Normal caliber of the aorta and branch vessels. No retroperitoneal or retrocrural adenopathy. Other: No ascites. Musculoskeletal: No acute osseous abnormality. IMPRESSION: 1. The CT abnormality represents focal fat deposition within the pancreatic head. No suspicious pancreatic lesion. 2.  No acute abdominal process. 3. Variant of pancreas divisum.  No acute pancreatitis. Electronically Signed   By: Abigail Miyamoto M.D.   On: 03/13/2016 08:51   Ct Abdomen Pelvis W Contrast  Result Date: 03/12/2016 CLINICAL DATA:  Left lower quadrant pain for 1 week with nausea vomiting and diarrhea. Fever. EXAM: CT ABDOMEN AND PELVIS WITH CONTRAST TECHNIQUE: Multidetector CT imaging of the abdomen and pelvis was performed using the standard protocol following bolus administration of intravenous contrast. CONTRAST:  110mL ISOVUE-300 IOPAMIDOL (ISOVUE-300) INJECTION 61% COMPARISON:  10/28/2013 FINDINGS: Lower chest: Dependent atelectasis seen in the lower lobes bilaterally. Hepatobiliary: No focal abnormality within the liver parenchyma. There is no evidence for gallstones, gallbladder wall thickening, or pericholecystic fluid. No intrahepatic or extrahepatic biliary dilation. Pancreas: Ill-defined hypo enhancing soft tissue in the anterior pancreatic head is associated with some mild distention of the main pancreatic duct in the same region. Spleen: No splenomegaly. No focal mass lesion. Adrenals/Urinary Tract: No adrenal nodule or mass. Early excretion of contrast noted in the collecting systems bilaterally. Tiny cysts identified upper pole left kidney. No enhancing lesion identified in either kidney. No evidence for hydroureter. Mild circumferential bladder wall thickening appears slightly out of proportion to the degree of decompression.  Stomach/Bowel: Stomach is nondistended. No gastric wall thickening. No evidence of outlet obstruction. Duodenum is normally positioned as is the ligament of Treitz. No small bowel wall thickening. No small bowel dilatation. The terminal ileum is normal. The appendix is not visualized, but there is no edema or inflammation in the region of the cecum. Diverticular changes are noted in the left colon. In the proximal sigmoid segment, there is colonic wall thickening with pericolonic edema/inflammation and a focal relatively thick walled collection of fluid and gas measuring 4.2 x 3.2 x 5.3 cm. Vascular/Lymphatic: No abdominal aortic aneurysm. There is no gastrohepatic or hepatoduodenal ligament lymphadenopathy. No intraperitoneal or retroperitoneal lymphadenopathy. No pelvic sidewall lymphadenopathy. Reproductive: Uterus is surgically absent. There is no adnexal mass. Other: Trace intraperitoneal free fluid noted in the cul-de-sac. Musculoskeletal: Bone windows reveal no worrisome lytic or sclerotic osseous lesions. IMPRESSION: 1. Proximal sigmoid diverticulitis with contained para colonic abscess as above. This is contiguous with the urinary bladder which may account for the mild circumferential bladder wall thickening. 2. Ill-defined low-attenuation in the head of the pancreas with dilatation of the main pancreatic duct. Adenocarcinoma cannot be excluded. MRI of the abdomen without and with contrast is recommended to further evaluate. 3. Trace free fluid in the cul-de-sac. Electronically Signed   By: Verda Cumins.D.  On: 03/12/2016 20:32   Mr 3d Recon At Scanner  Result Date: 03/13/2016 CLINICAL DATA:  CT demonstrating a possible pancreatic head lesion. EXAM: MRI ABDOMEN WITHOUT AND WITH CONTRAST TECHNIQUE: Multiplanar multisequence MR imaging of the abdomen was performed both before and after the administration of intravenous contrast. CONTRAST:  67mL MULTIHANCE GADOBENATE DIMEGLUMINE 529 MG/ML IV SOLN  COMPARISON:  03/12/2016 CT FINDINGS: Lower chest: Bilateral breast implants. Bibasilar atelectasis. Normal heart size without pericardial or pleural effusion. Hepatobiliary: No focal liver lesion. Hepatomegaly at 21.4 cm. There is minimal left anterior hepatic lobe intrahepatic duct dilatation, of indeterminate etiology. Example image 17/ series 12003. Normal gallbladder, without biliary ductal dilatation. Pancreas: No pancreatic duct dilatation. There is a variant of pancreas divisum, with a prominent dorsal duct entering the duodenum on image 26/ series 3. Communication with the common duct is identified. The CT abnormality was likely secondary to the focal fat deposition within the pancreas. Example image 51/series 9. No acute pancreatitis. Spleen: Normal in size, without focal abnormality. Adrenals/Urinary Tract: Normal adrenal glands. Bilateral renal cysts. No hydronephrosis. Stomach/Bowel: Normal stomach and abdominal bowel loops. Vascular/Lymphatic: Normal caliber of the aorta and branch vessels. No retroperitoneal or retrocrural adenopathy. Other: No ascites. Musculoskeletal: No acute osseous abnormality. IMPRESSION: 1. The CT abnormality represents focal fat deposition within the pancreatic head. No suspicious pancreatic lesion. 2.  No acute abdominal process. 3. Variant of pancreas divisum.  No acute pancreatitis. Electronically Signed   By: Abigail Miyamoto M.D.   On: 03/13/2016 08:51   Ct Image Guided Drainage By Percutaneous Catheter  Result Date: 03/13/2016 INDICATION: 65 year old female with a history of diverticular abscess EXAM: CT GUIDED DRAINAGE OF  ABSCESS MEDICATIONS: The patient is currently admitted to the hospital and receiving intravenous antibiotics. The antibiotics were administered within an appropriate time frame prior to the initiation of the procedure. ANESTHESIA/SEDATION: 2.0 mg IV Versed 100 mcg IV Fentanyl Moderate Sedation Time:  10 The patient was continuously monitored during the  procedure by the interventional radiology nurse under my direct supervision. COMPLICATIONS: None TECHNIQUE: Informed written consent was obtained from the patient after a thorough discussion of the procedural risks, benefits and alternatives. All questions were addressed. Maximal Sterile Barrier Technique was utilized including caps, mask, sterile gowns, sterile gloves, sterile drape, hand hygiene and skin antiseptic. A timeout was performed prior to the initiation of the procedure. PROCEDURE: Patient was positioned supine position on the CT gantry table. Scout CT was performed for planning purposes. Patient was then prepped and draped in the usual sterile fashion. The skin and subcutaneous tissues overlying the lower abdominal abscess collection was generously infiltrated 1% lidocaine for local anesthesia. Using CT guidance, a guide needle was advanced into the abscess. Once we confirmed location the needle, modified Seldinger technique was used to place a 12 French pigtail catheter drain. Approximately 10 cc - 15 cc of purulent fluid was aspirated for a sample to be sent to the lab. Catheter was sutured in position attached to gravity drainage. Patient tolerated the procedure well and remained hemodynamically stable throughout. No complications were encountered and no significant blood loss encountered. FINDINGS: Fluid and gas collection overlying the sigmoid colon, similar to the prior CT study. Inflammatory changes present. Status post drain placement there is collapse of the gas collection. IMPRESSION: Status post CT-guided drainage of abdominal abscess secondary to diverticulitis. Twelve french drain attached to gravity drainage with sample sent to the lab for analysis. Signed, Dulcy Fanny. Earleen Newport, DO [facility]  Electronically Signed   By: Corrie Mckusick D.O.   On: 03/13/2016 18:25    Time Spent in minutes  30   Idaliz Tinkle K M.D on 03/17/2016 at  11:12 AM  Between 7am to 7pm - Pager - [phone]  After 7pm go to [email or web address] - password [facility]  [facility] -  Office  [phone]

## 2016-03-18 ENCOUNTER — Encounter (HOSPITAL_COMMUNITY): Payer: Self-pay | Admitting: Radiology

## 2016-03-18 ENCOUNTER — Inpatient Hospital Stay (HOSPITAL_COMMUNITY): Payer: BLUE CROSS/BLUE SHIELD

## 2016-03-18 HISTORY — PX: IR GENERIC HISTORICAL: IMG1180011

## 2016-03-18 LAB — AEROBIC/ANAEROBIC CULTURE W GRAM STAIN (SURGICAL/DEEP WOUND)

## 2016-03-18 LAB — AEROBIC/ANAEROBIC CULTURE (SURGICAL/DEEP WOUND)

## 2016-03-18 MED ORDER — SODIUM CHLORIDE 0.9% FLUSH: 10.0000 mL | INTRAVENOUS | Status: DC | PRN

## 2016-03-18 MED ORDER — IOPAMIDOL (ISOVUE-300) INJECTION 61%
INTRAVENOUS | Status: AC
  Administered 2016-03-18: 10 mL
  Filled 2016-03-18: qty 50

## 2016-03-18 MED ORDER — SODIUM CHLORIDE 0.9 % IV SOLN: INTRAVENOUS | Status: AC

## 2016-03-18 MED ORDER — POTASSIUM CHLORIDE CRYS ER 20 MEQ PO TBCR
40.0000 meq | EXTENDED_RELEASE_TABLET | Freq: Four times a day (QID) | ORAL | Status: AC
Start: 2016-03-18 — End: 2016-03-18
  Administered 2016-03-18 (×2): 40 meq via ORAL
  Filled 2016-03-18 (×2): qty 2

## 2016-03-18 MED ORDER — SODIUM CHLORIDE 0.9 % IV SOLN
1.0000 g | INTRAVENOUS | Status: DC
  Administered 2016-03-18 – 2016-03-19 (×2): 1 g via INTRAVENOUS
  Filled 2016-03-18 (×2): qty 1

## 2016-03-18 MED ORDER — IOPAMIDOL (ISOVUE-300) INJECTION 61%
75.0000 mL | Freq: Once | INTRAVENOUS | Status: AC | PRN
  Administered 2016-03-18: 75 mL via INTRAVENOUS

## 2016-03-18 NOTE — Progress Notes (Signed)
PROGRESS NOTE                                                                                                                                                                                                             Patient Demographics:    Dorothy White, is a 65 y.o. female, DOB - Jul 16, 1951, [id number]  Admit date - 03/12/2016   Admitting Physician Karmen Bongo, MD  Outpatient Primary MD for the patient is WEBB, [name], MD  LOS - 6  Chief Complaint  Patient presents with  . Abdominal Pain       Brief Narrative    65 y.o.femalewith history of nonobstructive CAD (LHC July 2017 showed 25% stenosis at the mid LAD) GERD, depression who presented for evaluation of left lower abdominal pain. Further evaluation revealed sigmoid diverticulitis with a abscess. Gen. surgery was consulted, subsequently underwent CT-guided placement of a pigtail drain on 8/17. Was on empiric levofloxacin and Flagyl , now swithed to Imipenem on 03/15/16. See below for further details. Aspirate from drainage obtained on 03/13/2016 is growing ESBL Escherichia coli, antibiotics changed to imipenem   Subjective:    [name] today has, No headache, No chest pain, Mild left lower quadrant abdominal pain - No Nausea, No new weakness tingling or numbness, No Cough - SOB.    Assessment  & Plan :    1)Sigmoid diverticulitis with paracolic abscess: S/p CT-guided pigtail drain placement on 8/17.No significant drainage in the last 12 hours in her bag, tolerating liquid diet well since 03/14/2016. General surgery and interventional radiology consult appreciated. Aspirate from drainage obtained on 03/13/2016 is growing ESBL Escherichia coli, antibiotics changed to imipenem 03/15/2016, discussed with ID physician Dr. Johnnye Sima on 03/17/2016, place PICC line for 2 weeks of IV Invanz based on culture sensitivity. If better we'll discharge home in am  with outpatient IR and surgery follow-up.  2)Nonobstructive CR:[id number] LHC July 2017 showed 25% stenosis at the mid LAD is an, stable, asymptomatic, c/n aspirin.  3)Possible Pancreatic lesionseen on CT scan:MRI of the abdomen shows focal fat deposition within the pancreatic head without any suspicious pancreatic lesions-no further workup plannedat this time   4)Anxiety/depression: Stable, continue with BuSpar, Prozac and trazodone.    Family Communication  : None present  Code Status :  DO NOT RESUSCITATE  Diet : Soft  Disposition Plan  :  Home likely in the morning of 03-19-16  Consults  :  IR, surgery  Procedures  :     CT abdomen and pelvis with proximal sigmoid diverticulitis with abscess, questionable pancreatic abnormality ruled out on MRI abdomen.  IR placed left lower quadrant diverticular abscess drain on 03/13/2016    DVT Prophylaxis  :  Lovenox   Lab Results  Component Value Date   PLT 337 03/17/2016    Inpatient Medications  Scheduled Meds: . aspirin EC  81 mg Oral QODAY  . busPIRone  5 mg Oral TID  . enoxaparin (LOVENOX) injection  40 mg Subcutaneous Q24H  . ertapenem  1 g Intravenous Q24H  . feeding supplement (ENSURE ENLIVE)  237 mL Oral BID BM  . FLUoxetine  60 mg Oral Daily  . potassium chloride  40 mEq Oral Q6H  . traZODone  200 mg Oral QHS   Continuous Infusions: . lactated ringers 50 mL/hr at 03/18/16 0611   PRN Meds:.acetaminophen **OR** acetaminophen, antiseptic oral rinse, clonazePAM, ketorolac **AND** metoCLOPramide (REGLAN) injection **AND** diphenhydrAMINE (BENADRYL) IVPB(SICKLE CELL ONLY), HYDROmorphone (DILAUDID) injection, ondansetron **OR** ondansetron (ZOFRAN) IV, oxyCODONE-acetaminophen **AND** oxyCODONE, sodium chloride flush  Antibiotics  :    Anti-infectives    Start     Dose/Rate Route Frequency Ordered Stop   03/18/16 1000  ertapenem (INVANZ) 1 g in sodium chloride 0.9 % 50 mL IVPB     1 g 100 mL/hr over 30 Minutes  Intravenous Every 24 hours 03/18/16 0917     03/15/16 2200  imipenem-cilastatin (PRIMAXIN) 500 mg in sodium chloride 0.9 % 100 mL IVPB  Status:  Discontinued     500 mg 200 mL/hr over 30 Minutes Intravenous Every 8 hours 03/15/16 1810 03/18/16 0916   03/13/16 2200  levofloxacin (LEVAQUIN) IVPB 750 mg  Status:  Discontinued     750 mg 100 mL/hr over 90 Minutes Intravenous Every 24 hours 03/12/16 2316 03/15/16 1810   03/13/16 0800  metroNIDAZOLE (FLAGYL) IVPB 500 mg  Status:  Discontinued     500 mg 100 mL/hr over 60 Minutes Intravenous Every 8 hours 03/13/16 0737 03/15/16 1810   03/12/16 2130  metroNIDAZOLE (FLAGYL) IVPB 500 mg     500 mg 100 mL/hr over 60 Minutes Intravenous  Once 03/12/16 2115 03/12/16 2228   03/12/16 2115  levofloxacin (LEVAQUIN) IVPB 750 mg     750 mg 100 mL/hr over 90 Minutes Intravenous  Once 03/12/16 2115 03/12/16 2320         Objective:   Vitals:   03/17/16 0434 03/17/16 1700 03/17/16 2248 03/18/16 0446  BP: (!) 115/54 (!) 109/50 (!) 121/48 (!) 118/46  Pulse: 63 64 67 60  Resp: 18 18 18 16   Temp: 98 F (36.7 C) 98.4 F (36.9 C) 97.9 F (36.6 C) 97.9 F (36.6 C)  TempSrc: Oral Oral Oral Oral  SpO2: 95% 98% 98% 93%  Weight:    57.9 kg (127 lb 10.3 oz)  Height:        Wt Readings from Last 3 Encounters:  03/18/16 57.9 kg (127 lb 10.3 oz)  02/21/16 57.2 kg (126 lb 3.2 oz)  02/15/16 57.6 kg (127 lb)     Intake/Output Summary (Last 24 hours) at 03/18/16 1047 Last data filed at 03/18/16 [id number]  Gross per 24 hour  Intake          1627.51 ml  Output               10 ml  Net  1617.51 ml     Physical Exam  Awake Alert, Oriented X 3, No new F.N deficits, Normal affect [place].AT,PERRAL Supple Neck,No JVD, No cervical lymphadenopathy appriciated.  Symmetrical Chest wall movement, Good air movement bilaterally, CTAB RRR,No Gallops,Rubs or new Murmurs, No Parasternal Heave +ve B.Sounds, Abd Soft, No tenderness, No organomegaly appriciated, No  rebound - guarding or rigidity. No Cyanosis, Clubbing or edema, No new Rash or bruise       Data Review:    CBC  Recent Labs Lab 03/12/16 1321 03/13/16 0451 03/15/16 0503 03/17/16 1105  WBC 10.2 8.2 7.0 5.9  HGB 12.0 10.5* 10.4* 11.4*  HCT 38.0 34.0* 33.3* 36.7  PLT 307 247 277 337  MCV 90.9 90.9 90.2 91.1  MCH 28.7 28.1 28.2 28.3  MCHC 31.6 30.9 31.2 31.1  RDW 13.5 13.5 13.1 13.3    Chemistries   Recent Labs Lab 03/12/16 1321 03/13/16 0451 03/15/16 0503 03/17/16 1105  NA 139 140 140 142  K 3.3* 3.6 3.6 3.4*  CL 105 107 104 104  CO2 26 27 27  32  GLUCOSE 104* 95 97 104*  BUN 7 <5* <5* <5*  CREATININE 0.71 0.63 0.49 0.55  CALCIUM 8.9 8.6* 8.5* 8.5*  MG  --   --   --  1.8  AST 11*  --   --  24  ALT 13*  --   --  12*  ALKPHOS 77  --   --  60  BILITOT 0.7  --   --  0.4   ------------------------------------------------------------------------------------------------------------------ No results for input(s): CHOL, HDL, LDLCALC, TRIG, CHOLHDL, LDLDIRECT in the last 72 hours.  Lab Results  Component Value Date   HGBA1C 5.2 12/18/2015   ------------------------------------------------------------------------------------------------------------------ No results for input(s): TSH, T4TOTAL, T3FREE, THYROIDAB in the last 72 hours.  Invalid input(s): FREET3 ------------------------------------------------------------------------------------------------------------------ No results for input(s): VITAMINB12, FOLATE, FERRITIN, TIBC, IRON, RETICCTPCT in the last 72 hours.  Coagulation profile  Recent Labs Lab 03/13/16 0651  INR 1.23    No results for input(s): DDIMER in the last 72 hours.  Cardiac Enzymes No results for input(s): CKMB, TROPONINI, MYOGLOBIN in the last 168 hours.  Invalid input(s): CK ------------------------------------------------------------------------------------------------------------------ No results found for: BNP  Micro  Results Recent Results (from the past 240 hour(s))  Aerobic/Anaerobic Culture (surgical/deep wound)     Status: None   Collection Time: 03/13/16  2:44 PM  Result Value Ref Range Status   Specimen Description ABSCESS ABDOMEN  Final   Special Requests NONE  Final   Gram Stain   Final    ABUNDANT WBC PRESENT, PREDOMINANTLY PMN MODERATE GRAM NEGATIVE RODS RARE GRAM POSITIVE COCCI IN PAIRS    Culture   Final    ABUNDANT ESCHERICHIA COLI Confirmed Extended Spectrum Beta-Lactamase Producer (ESBL) NO ANAEROBES ISOLATED    Report Status 03/18/2016 FINAL  Final   Organism ID, Bacteria ESCHERICHIA COLI  Final      Susceptibility   Escherichia coli - MIC*    AMPICILLIN >=32 RESISTANT Resistant     CEFAZOLIN >=64 RESISTANT Resistant     CEFEPIME <=1 RESISTANT Resistant     CEFTAZIDIME >=64 RESISTANT Resistant     CEFTRIAXONE >=64 RESISTANT Resistant     CIPROFLOXACIN >=4 RESISTANT Resistant     GENTAMICIN 4 SENSITIVE Sensitive     IMIPENEM <=0.25 SENSITIVE Sensitive     TRIMETH/SULFA <=20 SENSITIVE Sensitive     AMPICILLIN/SULBACTAM >=32 RESISTANT Resistant     PIP/TAZO >=128 RESISTANT Resistant     Extended ESBL POSITIVE Resistant     *  ABUNDANT ESCHERICHIA COLI    Radiology Reports Mr Abdomen W Wo Contrast  Result Date: 03/13/2016 CLINICAL DATA:  CT demonstrating a possible pancreatic head lesion. EXAM: MRI ABDOMEN WITHOUT AND WITH CONTRAST TECHNIQUE: Multiplanar multisequence MR imaging of the abdomen was performed both before and after the administration of intravenous contrast. CONTRAST:  73mL MULTIHANCE GADOBENATE DIMEGLUMINE 529 MG/ML IV SOLN COMPARISON:  03/12/2016 CT FINDINGS: Lower chest: Bilateral breast implants. Bibasilar atelectasis. Normal heart size without pericardial or pleural effusion. Hepatobiliary: No focal liver lesion. Hepatomegaly at 21.4 cm. There is minimal left anterior hepatic lobe intrahepatic duct dilatation, of indeterminate etiology. Example image 17/ series  12003. Normal gallbladder, without biliary ductal dilatation. Pancreas: No pancreatic duct dilatation. There is a variant of pancreas divisum, with a prominent dorsal duct entering the duodenum on image 26/ series 3. Communication with the common duct is identified. The CT abnormality was likely secondary to the focal fat deposition within the pancreas. Example image 51/series 9. No acute pancreatitis. Spleen: Normal in size, without focal abnormality. Adrenals/Urinary Tract: Normal adrenal glands. Bilateral renal cysts. No hydronephrosis. Stomach/Bowel: Normal stomach and abdominal bowel loops. Vascular/Lymphatic: Normal caliber of the aorta and branch vessels. No retroperitoneal or retrocrural adenopathy. Other: No ascites. Musculoskeletal: No acute osseous abnormality. IMPRESSION: 1. The CT abnormality represents focal fat deposition within the pancreatic head. No suspicious pancreatic lesion. 2.  No acute abdominal process. 3. Variant of pancreas divisum.  No acute pancreatitis. Electronically Signed   By: Abigail Miyamoto M.D.   On: 03/13/2016 08:51   Ct Abdomen Pelvis W Contrast  Result Date: 03/12/2016 CLINICAL DATA:  Left lower quadrant pain for 1 week with nausea vomiting and diarrhea. Fever. EXAM: CT ABDOMEN AND PELVIS WITH CONTRAST TECHNIQUE: Multidetector CT imaging of the abdomen and pelvis was performed using the standard protocol following bolus administration of intravenous contrast. CONTRAST:  136mL ISOVUE-300 IOPAMIDOL (ISOVUE-300) INJECTION 61% COMPARISON:  10/28/2013 FINDINGS: Lower chest: Dependent atelectasis seen in the lower lobes bilaterally. Hepatobiliary: No focal abnormality within the liver parenchyma. There is no evidence for gallstones, gallbladder wall thickening, or pericholecystic fluid. No intrahepatic or extrahepatic biliary dilation. Pancreas: Ill-defined hypo enhancing soft tissue in the anterior pancreatic head is associated with some mild distention of the main pancreatic duct  in the same region. Spleen: No splenomegaly. No focal mass lesion. Adrenals/Urinary Tract: No adrenal nodule or mass. Early excretion of contrast noted in the collecting systems bilaterally. Tiny cysts identified upper pole left kidney. No enhancing lesion identified in either kidney. No evidence for hydroureter. Mild circumferential bladder wall thickening appears slightly out of proportion to the degree of decompression. Stomach/Bowel: Stomach is nondistended. No gastric wall thickening. No evidence of outlet obstruction. Duodenum is normally positioned as is the ligament of Treitz. No small bowel wall thickening. No small bowel dilatation. The terminal ileum is normal. The appendix is not visualized, but there is no edema or inflammation in the region of the cecum. Diverticular changes are noted in the left colon. In the proximal sigmoid segment, there is colonic wall thickening with pericolonic edema/inflammation and a focal relatively thick walled collection of fluid and gas measuring 4.2 x 3.2 x 5.3 cm. Vascular/Lymphatic: No abdominal aortic aneurysm. There is no gastrohepatic or hepatoduodenal ligament lymphadenopathy. No intraperitoneal or retroperitoneal lymphadenopathy. No pelvic sidewall lymphadenopathy. Reproductive: Uterus is surgically absent. There is no adnexal mass. Other: Trace intraperitoneal free fluid noted in the cul-de-sac. Musculoskeletal: Bone windows reveal no worrisome lytic or sclerotic osseous lesions. IMPRESSION: 1. Proximal sigmoid diverticulitis  with contained para colonic abscess as above. This is contiguous with the urinary bladder which may account for the mild circumferential bladder wall thickening. 2. Ill-defined low-attenuation in the head of the pancreas with dilatation of the main pancreatic duct. Adenocarcinoma cannot be excluded. MRI of the abdomen without and with contrast is recommended to further evaluate. 3. Trace free fluid in the cul-de-sac. Electronically Signed    By: Misty Stanley M.D.   On: 03/12/2016 20:32   Mr 3d Recon At Scanner  Result Date: 03/13/2016 CLINICAL DATA:  CT demonstrating a possible pancreatic head lesion. EXAM: MRI ABDOMEN WITHOUT AND WITH CONTRAST TECHNIQUE: Multiplanar multisequence MR imaging of the abdomen was performed both before and after the administration of intravenous contrast. CONTRAST:  60mL MULTIHANCE GADOBENATE DIMEGLUMINE 529 MG/ML IV SOLN COMPARISON:  03/12/2016 CT FINDINGS: Lower chest: Bilateral breast implants. Bibasilar atelectasis. Normal heart size without pericardial or pleural effusion. Hepatobiliary: No focal liver lesion. Hepatomegaly at 21.4 cm. There is minimal left anterior hepatic lobe intrahepatic duct dilatation, of indeterminate etiology. Example image 17/ series 12003. Normal gallbladder, without biliary ductal dilatation. Pancreas: No pancreatic duct dilatation. There is a variant of pancreas divisum, with a prominent dorsal duct entering the duodenum on image 26/ series 3. Communication with the common duct is identified. The CT abnormality was likely secondary to the focal fat deposition within the pancreas. Example image 51/series 9. No acute pancreatitis. Spleen: Normal in size, without focal abnormality. Adrenals/Urinary Tract: Normal adrenal glands. Bilateral renal cysts. No hydronephrosis. Stomach/Bowel: Normal stomach and abdominal bowel loops. Vascular/Lymphatic: Normal caliber of the aorta and branch vessels. No retroperitoneal or retrocrural adenopathy. Other: No ascites. Musculoskeletal: No acute osseous abnormality. IMPRESSION: 1. The CT abnormality represents focal fat deposition within the pancreatic head. No suspicious pancreatic lesion. 2.  No acute abdominal process. 3. Variant of pancreas divisum.  No acute pancreatitis. Electronically Signed   By: Abigail Miyamoto M.D.   On: 03/13/2016 08:51   Ct Image Guided Drainage By Percutaneous Catheter  Result Date: 03/13/2016 INDICATION: 65 year old female  with a history of diverticular abscess EXAM: CT GUIDED DRAINAGE OF  ABSCESS MEDICATIONS: The patient is currently admitted to the hospital and receiving intravenous antibiotics. The antibiotics were administered within an appropriate time frame prior to the initiation of the procedure. ANESTHESIA/SEDATION: 2.0 mg IV Versed 100 mcg IV Fentanyl Moderate Sedation Time:  10 The patient was continuously monitored during the procedure by the interventional radiology nurse under my direct supervision. COMPLICATIONS: None TECHNIQUE: Informed written consent was obtained from the patient after a thorough discussion of the procedural risks, benefits and alternatives. All questions were addressed. Maximal Sterile Barrier Technique was utilized including caps, mask, sterile gowns, sterile gloves, sterile drape, hand hygiene and skin antiseptic. A timeout was performed prior to the initiation of the procedure. PROCEDURE: Patient was positioned supine position on the CT gantry table. Scout CT was performed for planning purposes. Patient was then prepped and draped in the usual sterile fashion. The skin and subcutaneous tissues overlying the lower abdominal abscess collection was generously infiltrated 1% lidocaine for local anesthesia. Using CT guidance, a guide needle was advanced into the abscess. Once we confirmed location the needle, modified Seldinger technique was used to place a 12 French pigtail catheter drain. Approximately 10 cc - 15 cc of purulent fluid was aspirated for a sample to be sent to the lab. Catheter was sutured in position attached to gravity drainage. Patient tolerated the procedure well and remained hemodynamically stable throughout. No complications  were encountered and no significant blood loss encountered. FINDINGS: Fluid and gas collection overlying the sigmoid colon, similar to the prior CT study. Inflammatory changes present. Status post drain placement there is collapse of the gas collection.  IMPRESSION: Status post CT-guided drainage of abdominal abscess secondary to diverticulitis. Twelve french drain attached to gravity drainage with sample sent to the lab for analysis. Signed, Dulcy Fanny. Earleen Newport, DO [facility] Radiology Electronically Signed   By: Corrie Mckusick D.O.   On: 03/13/2016 18:25    Time Spent in minutes  30   Mayan Kloepfer K M.D on 03/18/2016 at 10:47 AM  Between 7am to 7pm - Pager - [phone]  After 7pm go to [email or web address] - password [facility]  [facility] -  Office  [phone]

## 2016-03-18 NOTE — Progress Notes (Signed)
03/18/2016 11:07 AM  Patient expressed concerns about not wanting to be discharged today due to not having a ride and not having home health set up. Will inform attending RN and CM [facility].   [facility] BSN, RN-BC, [facility] [address] Phone [phone]

## 2016-03-18 NOTE — Progress Notes (Signed)
Peripherally Inserted Central Catheter/Midline Placement  The IV Nurse has discussed with the patient and/or persons authorized to consent for the patient, the purpose of this procedure and the potential benefits and risks involved with this procedure.  The benefits include less needle sticks, lab draws from the catheter and patient may be discharged home with the catheter.  Risks include, but not limited to, infection, bleeding, blood clot (thrombus formation), and puncture of an artery; nerve damage and irregular heat beat.  Alternatives to this procedure were also discussed.  PICC/Midline Placement Documentation        Dorothy White 03/18/2016, 9:26 AM

## 2016-03-18 NOTE — Care Management Note (Signed)
Case Management Note  Patient Details  Name: Dorothy White MRN: [id number] Date of Birth: 1951-06-08  Subjective/Objective:     CM following for progression and d/c planning.                Action/Plan: 03/17/2016 Met with pt to discuss d/c needs. Pt familiar with [facility] and wishes to use that agency. PICC placement and management of home IV antibiotics discussed. Pt states that she has family/friend who may also learn the procedure and assist if needed.  03/18/2016 Met briefly with pt, PICC in place, [facility] notified of pt need for IV antibiotics, await decision on antibiotic for home use.   Expected Discharge Date:   03/18/2016               Expected Discharge Plan:  [place]  In-House Referral:  NA  Discharge planning Services  CM Consult  Post Acute Care Choice:  Home Health, Durable Medical Equipment Choice offered to:  Patient  DME Arranged:  IV pump/equipment DME Agency:  [place]:  RN [facility] Agency:  [place]  Status of Service:  Completed, signed off  If discussed at [place] of Stay Meetings, dates discussed:    Additional Comments:  Adron Bene, RN 03/18/2016, 10:16 AM

## 2016-03-18 NOTE — Progress Notes (Signed)
Patient's CT scan shows resolution of her abscess.  She was brought down for a drain injection study which was negative for a fistula.  Her drain was removed without incident.  No IR follow up needed at discharge.  Soma Bachand E 3:55 PM 03/18/2016

## 2016-03-19 MED ORDER — HEPARIN SOD (PORK) LOCK FLUSH 100 UNIT/ML IV SOLN
250.0000 [IU] | INTRAVENOUS | Status: AC | PRN
  Administered 2016-03-19: 250 [IU]

## 2016-03-19 MED ORDER — SODIUM CHLORIDE 0.9 % IV SOLN: 1.0000 g | INTRAVENOUS | 0 refills | Status: DC

## 2016-03-19 NOTE — Discharge Summary (Signed)
Dorothy White [id number] DOB: [birth year] DOA: 03/12/2016  PCP: Jonathon Bellows, MD  Admit date: 03/12/2016  Discharge date: 03/19/2016  Admitted From: Home   Disposition:  Home   Recommendations for Outpatient Follow-up:   Follow up with PCP in 1-2 weeks  PCP Please obtain BMP/CBC, 2 view CXR in 1week,  (see Discharge instructions)   PCP Please follow up on the following pending results: None   Home Health: HHRN   Equipment/Devices: None  Consultations: CCS, IR Discharge Condition: Stable   CODE STATUS: Full   Diet Recommendation: Soft   Chief Complaint  Patient presents with  . Abdominal Pain     Brief history of present illness from the day of admission and additional interim summary     65 y.o.femalewith history of nonobstructive CAD (LHC July 2017 showed 25% stenosis at the mid LAD) GERD, depression who presented for evaluation of left lower abdominal pain. Further evaluation revealed sigmoid diverticulitis with a abscess. Gen. surgery was consulted, subsequently underwent CT-guided placement of a pigtail drain on 8/17. Was on empiric levofloxacin and Flagyl , now swithed to Imipenem on 03/15/16. See below for further details. Aspirate from drainage obtained on 03/13/2016 is growing ESBL Escherichia coli, antibiotics changed to imipenem   Hospital issues addressed     1)Sigmoid diverticulitis with paracolic abscess: S/p CT-guided pigtail drain placement on 8/17.No significant drainage in the last 12 hours in her bag, tolerating liquid diet well since 03/14/2016. General surgery and interventional radiology consult appreciated. Aspirate from drainage obtained on 03/13/2016 is growing ESBL Escherichia coli, She underwent repeat imaging on 03/18/2016 which showed almost complete resolution of her  diverticular abscess, IR removed her drain, her case was discussed with ID physician Dr. Johnnye Sima over the weekend, she will get 1 more week of IV Invanz through left arm PICC line and discharged home, will be requested to follow with PCP, her primary GI and general surgery in 1-2 weeks. Request PCP who kindly check CBC BMP and weak, if clinically stable stop Invanz and remove PICC line.  2)Nonobstructive XO:[id number] LHC July 2017 showed 25% stenosis at the mid LAD is an, stable, asymptomatic, c/n aspirin.  3)Possible Pancreatic lesionseen on CT scan:MRI of the abdomen shows focal fat deposition within the pancreatic head without any suspicious pancreatic lesions-no further workup plannedat this time   4)Anxiety/depression: Stable, continue with BuSpar, Prozac and trazodone   Discharge diagnosis     Principal Problem:   Infection due to ESBL-producing Escherichia coli/Diverticular Abscess Active Problems:   Anxiety and depression   Diverticulitis   Chronic pain syndrome   Lesion of pancreas   History of chest pain   Hypokalemia    Discharge instructions    Discharge Instructions    Discharge instructions    Complete by:  As directed   Follow with Primary MD WEBB, CAROL D, MD in 7 days   Get CBC, CMP, 2 view Chest X ray checked  by Primary MD or SNF MD in 5-7 days ( we routinely change or  add medications that can affect your baseline labs and fluid status, therefore we recommend that you get the mentioned basic workup next visit with your PCP, your PCP may decide not to get them or add new tests based on their clinical decision)   Activity: As tolerated with Full fall precautions use walker/cane & assistance as needed   Disposition Home    Diet:   Soft.   On your next visit with your primary care physician please Get Medicines reviewed and adjusted.   Please request your Prim.MD to go over all Hospital Tests and Procedure/Radiological results at the follow up,  please get all Hospital records sent to your Prim MD by signing hospital release before you go home.   If you experience worsening of your admission symptoms, develop shortness of breath, life threatening emergency, suicidal or homicidal thoughts you must seek medical attention immediately by calling 911 or calling your MD immediately  if symptoms less severe.  You Must read complete instructions/literature along with all the possible adverse reactions/side effects for all the Medicines you take and that have been prescribed to you. Take any new Medicines after you have completely understood and accpet all the possible adverse reactions/side effects.   Do not drive, operate heavy machinery, perform activities at heights, swimming or participation in water activities or provide baby sitting services if your were admitted for syncope or siezures until you have seen by Primary MD or a Neurologist and advised to do so again.  Do not drive when taking Pain medications.    Do not take more than prescribed Pain, Sleep and Anxiety Medications  Special Instructions: If you have smoked or chewed Tobacco  in the last 2 yrs please stop smoking, stop any regular Alcohol  and or any Recreational drug use.  Wear Seat belts while driving.   Please note  You were cared for by a hospitalist during your hospital stay. If you have any questions about your discharge medications or the care you received while you were in the hospital after you are discharged, you can call the unit and asked to speak with the hospitalist on call if the hospitalist that took care of you is not available. Once you are discharged, your primary care physician will handle any further medical issues. Please note that NO REFILLS for any discharge medications will be authorized once you are discharged, as it is imperative that you return to your primary care physician (or establish a relationship with a primary care physician if you do not  have one) for your aftercare needs so that they can reassess your need for medications and monitor your lab values.   Increase activity slowly    Complete by:  As directed      Discharge Medications     Medication List    STOP taking these medications   ciprofloxacin 500 MG tablet Commonly known as:  CIPRO     TAKE these medications   aspirin EC 81 MG tablet Take 81 mg by mouth every other day.   busPIRone 5 MG tablet Commonly known as:  BUSPAR Take 1 tablet by mouth 3 (three) times daily.   calcium-vitamin D 500-200 MG-UNIT tablet Commonly known as:  OSCAL WITH D Take 1 tablet by mouth daily with breakfast.   clonazePAM 1 MG tablet Commonly known as:  KLONOPIN Take 1 tablet (1 mg total) by mouth daily as needed (anxiety/sleep.). What changed:  when to take this  reasons to take this   ertapenem  1 g in sodium chloride 0.9 % 50 mL Inject 1 g into the vein daily.   FLUoxetine 20 MG capsule Commonly known as:  PROZAC Take 60 mg by mouth daily.   LUTEIN PO Take 1 capsule by mouth every evening.   mometasone 50 MCG/ACT nasal spray Commonly known as:  NASONEX Place 2 sprays into the nose daily.   ONE-A-DAY WOMENS 50 PLUS PO Take 1 tablet by mouth every evening.   oxyCODONE-acetaminophen 10-325 MG tablet Commonly known as:  PERCOCET Take 1 tablet by mouth 4 (four) times daily as needed.   RELPAX 40 MG tablet Generic drug:  eletriptan Take 1 tablet by mouth as needed for migraine and may take 1 additional tablet in 2 hours if no relief   traZODone 100 MG tablet Commonly known as:  DESYREL Take 200 mg by mouth at bedtime.   VITAMIN C PO Take 1 tablet by mouth every evening.   VITAMIN D PO Take 1 capsule by mouth every evening.   vitamin E 100 UNIT capsule Take 100 Units by mouth every evening.   XYZAL ALLERGY 24HR 5 MG tablet Generic drug:  levocetirizine Take 5 mg by mouth every evening.       Follow-up Information    WEBB, CAROL D, MD.  Schedule an appointment as soon as possible for a visit in 1 week(s).   Specialty:  Family Medicine Contact information: [place] [address] [phone]        Scarlette Shorts, MD. Schedule an appointment as soon as possible for a visit in 1 week(s).   Specialty:  Gastroenterology Contact information: [address] [address] [phone]        BYERLY,FAERA, MD .   Specialty:  General Surgery Why:  04/07/16 at 10:45 with Dr. Barry Dienes, please arrive at 10:15 to fill out paperwork Contact information: [address] [place] [address] [phone]           Major procedures and Radiology Reports - PLEASE review detailed and final reports thoroughly  -        Mr Abdomen W Wo Contrast  Result Date: 03/13/2016 CLINICAL DATA:  CT demonstrating a possible pancreatic head lesion. EXAM: MRI ABDOMEN WITHOUT AND WITH CONTRAST TECHNIQUE: Multiplanar multisequence MR imaging of the abdomen was performed both before and after the administration of intravenous contrast. CONTRAST:  36mL MULTIHANCE GADOBENATE DIMEGLUMINE 529 MG/ML IV SOLN COMPARISON:  03/12/2016 CT FINDINGS: Lower chest: Bilateral breast implants. Bibasilar atelectasis. Normal heart size without pericardial or pleural effusion. Hepatobiliary: No focal liver lesion. Hepatomegaly at 21.4 cm. There is minimal left anterior hepatic lobe intrahepatic duct dilatation, of indeterminate etiology. Example image 17/ series 12003. Normal gallbladder, without biliary ductal dilatation. Pancreas: No pancreatic duct dilatation. There is a variant of pancreas divisum, with a prominent dorsal duct entering the duodenum on image 26/ series 3. Communication with the common duct is identified. The CT abnormality was likely secondary to the focal fat deposition within the pancreas. Example image 51/series 9. No acute pancreatitis. Spleen: Normal in size, without focal abnormality.  Adrenals/Urinary Tract: Normal adrenal glands. Bilateral renal cysts. No hydronephrosis. Stomach/Bowel: Normal stomach and abdominal bowel loops. Vascular/Lymphatic: Normal caliber of the aorta and branch vessels. No retroperitoneal or retrocrural adenopathy. Other: No ascites. Musculoskeletal: No acute osseous abnormality. IMPRESSION: 1. The CT abnormality represents focal fat deposition within the pancreatic head. No suspicious pancreatic lesion. 2.  No acute abdominal process. 3. Variant of  pancreas divisum.  No acute pancreatitis. Electronically Signed   By: Abigail Miyamoto M.D.   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 EXAM: CT ABDOMEN AND PELVIS WITH CONTRAST TECHNIQUE: Multidetector CT imaging of the abdomen and pelvis was performed using the standard protocol following bolus administration of intravenous contrast. CONTRAST:  75mL ISOVUE-300 IOPAMIDOL (ISOVUE-300) INJECTION 61% COMPARISON:  03/13/2016 FINDINGS: Lower chest: There are bilateral small pleural effusions with associated bibasilar atelectasis. Hepatobiliary: No masses or other significant abnormality. Gallbladder is normal. Pancreas: No mass, inflammatory changes, or other significant abnormality. Spleen: Within normal limits in size and appearance. Adrenals/Urinary Tract: No masses identified. No evidence of hydronephrosis. Left renal cysts, the larger measuring 13 mm, noted. Stomach/Bowel: A pigtail catheter has been placed within the known left pericolonic abscess with interval decrease in the size of the abscess now measuring 3.4 x 2.7 x 4.6 cm. Extensive colonic diverticulosis is again seen. No evidence of obstruction. Vascular/Lymphatic: No pathologically enlarged lymph nodes. No evidence of abdominal aortic aneurysm. Reproductive: Post hysterectomy.  No suspicious findings. Other: Persistent mild circumferential thickening of the urinary  bladder wall. Musculoskeletal:  No suspicious bone lesions identified. IMPRESSION: Interval mild decrease in the size of left pelvic pericolonic abscess, now measuring 3.4 x 2.7 x 4.6 cm, with appropriate positioning of the pigtail draining catheter. Extensive left colonic diverticulosis. Interval development of bilateral small pleural effusions and subsegmental atelectasis. Electronically Signed   By: Fidela Salisbury M.D.   On: 03/18/2016 12:21   Ct Abdomen Pelvis W Contrast  Result Date: 03/12/2016 CLINICAL DATA:  Left lower quadrant pain for 1 week with nausea vomiting and diarrhea. Fever. EXAM: CT ABDOMEN AND PELVIS WITH CONTRAST TECHNIQUE: Multidetector CT imaging of the abdomen and pelvis was performed using the standard protocol following bolus administration of intravenous contrast. CONTRAST:  143mL ISOVUE-300 IOPAMIDOL (ISOVUE-300) INJECTION 61% COMPARISON:  10/28/2013 FINDINGS: Lower chest: Dependent atelectasis seen in the lower lobes bilaterally. Hepatobiliary: No focal abnormality within the liver parenchyma. There is no evidence for gallstones, gallbladder wall thickening, or pericholecystic fluid. No intrahepatic or extrahepatic biliary dilation. Pancreas: Ill-defined hypo enhancing soft tissue in the anterior pancreatic head is associated with some mild distention of the main pancreatic duct in the same region. Spleen: No splenomegaly. No focal mass lesion. Adrenals/Urinary Tract: No adrenal nodule or mass. Early excretion of contrast noted in the collecting systems bilaterally. Tiny cysts identified upper pole left kidney. No enhancing lesion identified in either kidney. No evidence for hydroureter. Mild circumferential bladder wall thickening appears slightly out of proportion to the degree of decompression. Stomach/Bowel: Stomach is nondistended. No gastric wall thickening. No evidence of outlet obstruction. Duodenum is normally positioned as is the ligament of Treitz. No small bowel wall  thickening. No small bowel dilatation. The terminal ileum is normal. The appendix is not visualized, but there is no edema or inflammation in the region of the cecum. Diverticular changes are noted in the left colon. In the proximal sigmoid segment, there is colonic wall thickening with pericolonic edema/inflammation and a focal relatively thick walled collection of fluid and gas measuring 4.2 x 3.2 x 5.3 cm. Vascular/Lymphatic: No abdominal aortic aneurysm. There is no gastrohepatic or hepatoduodenal ligament lymphadenopathy. No intraperitoneal or retroperitoneal lymphadenopathy. No pelvic sidewall lymphadenopathy. Reproductive: Uterus is surgically absent. There is no adnexal mass. Other: Trace intraperitoneal free fluid noted in the cul-de-sac. Musculoskeletal: Bone windows reveal no worrisome lytic or sclerotic  osseous lesions. IMPRESSION: 1. Proximal sigmoid diverticulitis with contained para colonic abscess as above. This is contiguous with the urinary bladder which may account for the mild circumferential bladder wall thickening. 2. Ill-defined low-attenuation in the head of the pancreas with dilatation of the main pancreatic duct. Adenocarcinoma cannot be excluded. MRI of the abdomen without and with contrast is recommended to further evaluate. 3. Trace free fluid in the cul-de-sac. Electronically Signed   By: Misty Stanley M.D.   On: 03/12/2016 20:32   Mr 3d Recon At Scanner  Result Date: 03/13/2016 CLINICAL DATA:  CT demonstrating a possible pancreatic head lesion. EXAM: MRI ABDOMEN WITHOUT AND WITH CONTRAST TECHNIQUE: Multiplanar multisequence MR imaging of the abdomen was performed both before and after the administration of intravenous contrast. CONTRAST:  50mL MULTIHANCE GADOBENATE DIMEGLUMINE 529 MG/ML IV SOLN COMPARISON:  03/12/2016 CT FINDINGS: Lower chest: Bilateral breast implants. Bibasilar atelectasis. Normal heart size without pericardial or pleural effusion. Hepatobiliary: No focal liver  lesion. Hepatomegaly at 21.4 cm. There is minimal left anterior hepatic lobe intrahepatic duct dilatation, of indeterminate etiology. Example image 17/ series 12003. Normal gallbladder, without biliary ductal dilatation. Pancreas: No pancreatic duct dilatation. There is a variant of pancreas divisum, with a prominent dorsal duct entering the duodenum on image 26/ series 3. Communication with the common duct is identified. The CT abnormality was likely secondary to the focal fat deposition within the pancreas. Example image 51/series 9. No acute pancreatitis. Spleen: Normal in size, without focal abnormality. Adrenals/Urinary Tract: Normal adrenal glands. Bilateral renal cysts. No hydronephrosis. Stomach/Bowel: Normal stomach and abdominal bowel loops. Vascular/Lymphatic: Normal caliber of the aorta and branch vessels. No retroperitoneal or retrocrural adenopathy. Other: No ascites. Musculoskeletal: No acute osseous abnormality. IMPRESSION: 1. The CT abnormality represents focal fat deposition within the pancreatic head. No suspicious pancreatic lesion. 2.  No acute abdominal process. 3. Variant of pancreas divisum.  No acute pancreatitis. Electronically Signed   By: Abigail Miyamoto M.D.   On: 03/13/2016 08:51   Ct Image Guided Drainage By Percutaneous Catheter  Result Date: 03/13/2016 INDICATION: 65 year old female with a history of diverticular abscess EXAM: CT GUIDED DRAINAGE OF  ABSCESS MEDICATIONS: The patient is currently admitted to the hospital and receiving intravenous antibiotics. The antibiotics were administered within an appropriate time frame prior to the initiation of the procedure. ANESTHESIA/SEDATION: 2.0 mg IV Versed 100 mcg IV Fentanyl Moderate Sedation Time:  10 The patient was continuously monitored during the procedure by the interventional radiology nurse under my direct supervision. COMPLICATIONS: None TECHNIQUE: Informed written consent was obtained from the patient after a thorough  discussion of the procedural risks, benefits and alternatives. All questions were addressed. Maximal Sterile Barrier Technique was utilized including caps, mask, sterile gowns, sterile gloves, sterile drape, hand hygiene and skin antiseptic. A timeout was performed prior to the initiation of the procedure. PROCEDURE: Patient was positioned supine position on the CT gantry table. Scout CT was performed for planning purposes. Patient was then prepped and draped in the usual sterile fashion. The skin and subcutaneous tissues overlying the lower abdominal abscess collection was generously infiltrated 1% lidocaine for local anesthesia. Using CT guidance, a guide needle was advanced into the abscess. Once we confirmed location the needle, modified Seldinger technique was used to place a 12 French pigtail catheter drain. Approximately 10 cc - 15 cc of purulent fluid was aspirated for a sample to be sent to the lab. Catheter was sutured in position attached to gravity drainage. Patient tolerated the procedure well  and remained hemodynamically stable throughout. No complications were encountered and no significant blood loss encountered. FINDINGS: Fluid and gas collection overlying the sigmoid colon, similar to the prior CT study. Inflammatory changes present. Status post drain placement there is collapse of the gas collection. IMPRESSION: Status post CT-guided drainage of abdominal abscess secondary to diverticulitis. Twelve french drain attached to gravity drainage with sample sent to the lab for analysis. Signed, Dulcy Fanny. Earleen Newport, DO [facility] Electronically Signed   By: Corrie Mckusick D.O.   On: 03/13/2016 18:25    Micro Results    Recent Results (from the past 240 hour(s))  Aerobic/Anaerobic Culture (surgical/deep wound)     Status: None   Collection Time: 03/13/16  2:44 PM  Result Value Ref Range Status   Specimen Description ABSCESS ABDOMEN  Final    Special Requests NONE  Final   Gram Stain   Final    ABUNDANT WBC PRESENT, PREDOMINANTLY PMN MODERATE GRAM NEGATIVE RODS RARE GRAM POSITIVE COCCI IN PAIRS    Culture   Final    ABUNDANT ESCHERICHIA COLI Confirmed Extended Spectrum Beta-Lactamase Producer (ESBL) NO ANAEROBES ISOLATED    Report Status 03/18/2016 FINAL  Final   Organism ID, Bacteria ESCHERICHIA COLI  Final      Susceptibility   Escherichia coli - MIC*    AMPICILLIN >=32 RESISTANT Resistant     CEFAZOLIN >=64 RESISTANT Resistant     CEFEPIME <=1 RESISTANT Resistant     CEFTAZIDIME >=64 RESISTANT Resistant     CEFTRIAXONE >=64 RESISTANT Resistant     CIPROFLOXACIN >=4 RESISTANT Resistant     GENTAMICIN 4 SENSITIVE Sensitive     IMIPENEM <=0.25 SENSITIVE Sensitive     TRIMETH/SULFA <=20 SENSITIVE Sensitive     AMPICILLIN/SULBACTAM >=32 RESISTANT Resistant     PIP/TAZO >=128 RESISTANT Resistant     Extended ESBL POSITIVE Resistant     * ABUNDANT ESCHERICHIA COLI    Today   Subjective    [name] today has no headache,no chest abdominal pain,no new weakness tingling or numbness, feels much better wants to go home today.    Objective   Blood pressure 138/78, pulse 71, temperature 98.2 F (36.8 C), temperature source Oral, resp. rate 16, height 5\' 5"  (1.651 m), weight 57.9 kg (127 lb 10.3 oz), SpO2 96 %.   Intake/Output Summary (Last 24 hours) at 03/19/16 0933 Last data filed at 03/19/16 0843  Gross per 24 hour  Intake          1006.67 ml  Output                0 ml  Net          1006.67 ml    Exam Awake Alert, Oriented x 3, No new F.N deficits, Normal affect [place].AT,PERRAL Supple Neck,No JVD, No cervical lymphadenopathy appriciated.  Symmetrical Chest wall movement, Good air movement bilaterally, CTAB RRR,No Gallops,Rubs or new Murmurs, No Parasternal Heave +ve B.Sounds, Abd Soft, Non tender, No organomegaly appriciated, No rebound -guarding or rigidity. No Cyanosis, Clubbing or edema, No new  Rash or bruise, RLQ drain has been removed   Data Review   CBC w Diff: Lab Results  Component Value Date   WBC 5.9 03/17/2016   HGB 11.4 (L) 03/17/2016   HCT 36.7 03/17/2016   PLT 337 03/17/2016   LYMPHOPCT 27 02/15/2016   MONOPCT 8 02/15/2016   EOSPCT 4 02/15/2016   BASOPCT 0 02/15/2016    CMP: Lab Results  Component Value Date   NA 142 03/17/2016   K 3.4 (L) 03/17/2016   CL 104 03/17/2016   CO2 32 03/17/2016   BUN <5 (L) 03/17/2016   CREATININE 0.55 03/17/2016   CREATININE 0.95 02/15/2016   PROT 5.2 (L) 03/17/2016   ALBUMIN 2.4 (L) 03/17/2016   BILITOT 0.4 03/17/2016   ALKPHOS 60 03/17/2016   AST 24 03/17/2016   ALT 12 (L) 03/17/2016  .   Total Time in preparing paper work, data evaluation and todays exam - 35 minutes  Thurnell Lose M.D on 03/19/2016 at 9:33 AM  [facility]   Office  [phone]

## 2016-03-19 NOTE — Progress Notes (Signed)
Patient ID: Dorothy White, female   DOB: [birth year], 65 y.o.   MRN: [id number]  [facility] Surgery Progress Note     Subjective: Continuing to improve. She reports minimal abdominal pain. Denies nausea vomiting. Had BM this morning.  Objective: Vital signs in last 24 hours: Temp:  [97.8 F (36.6 C)-98.4 F (36.9 C)] 98.2 F (36.8 C) (08/23 0842) Pulse Rate:  [60-75] 71 (08/23 0842) Resp:  [16-18] 16 (08/23 0842) BP: (117-138)/(55-78) 138/78 (08/23 0842) SpO2:  [96 %-98 %] 96 % (08/23 0842) Last BM Date: 03/15/16  Intake/Output from previous day: 08/22 0701 - 08/23 0700 In: 1246.7 [P.O.:1100; I.V.:146.7] Out: 5 [Drains:5] Intake/Output this shift: No intake/output data recorded.  PE: Gen: Alert, NAD, pleasant Abd: Soft, minimal tenderness, ND, perc drain site healing well with no erythema or drainage  Lab Results:   Recent Labs  03/17/16 1105  WBC 5.9  HGB 11.4*  HCT 36.7  PLT 337   BMET  Recent Labs  03/17/16 1105  NA 142  K 3.4*  CL 104  CO2 32  GLUCOSE 104*  BUN <5*  CREATININE 0.55  CALCIUM 8.5*   PT/INR No results for input(s): LABPROT, INR in the last 72 hours. CMP     Component Value Date/Time   NA 142 03/17/2016 1105   K 3.4 (L) 03/17/2016 1105   CL 104 03/17/2016 1105   CO2 32 03/17/2016 1105   GLUCOSE 104 (H) 03/17/2016 1105   BUN <5 (L) 03/17/2016 1105   CREATININE 0.55 03/17/2016 1105   CREATININE 0.95 02/15/2016 1536   CALCIUM 8.5 (L) 03/17/2016 1105   CALCIUM 9.4 01/13/2007 2356   PROT 5.2 (L) 03/17/2016 1105   ALBUMIN 2.4 (L) 03/17/2016 1105   AST 24 03/17/2016 1105   ALT 12 (L) 03/17/2016 1105   ALKPHOS 60 03/17/2016 1105   BILITOT 0.4 03/17/2016 1105   GFRNONAA >60 03/17/2016 1105   GFRAA >60 03/17/2016 1105   Lipase     Component Value Date/Time   LIPASE 16 03/12/2016 1321       Studies/Results: Ct Abdomen Pelvis W Contrast  Result Date: 03/18/2016 CLINICAL DATA:  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 EXAM: CT ABDOMEN AND PELVIS WITH CONTRAST TECHNIQUE: Multidetector CT imaging of the abdomen and pelvis was performed using the standard protocol following bolus administration of intravenous contrast. CONTRAST:  39mL ISOVUE-300 IOPAMIDOL (ISOVUE-300) INJECTION 61% COMPARISON:  03/13/2016 FINDINGS: Lower chest: There are bilateral small pleural effusions with associated bibasilar atelectasis. Hepatobiliary: No masses or other significant abnormality. Gallbladder is normal. Pancreas: No mass, inflammatory changes, or other significant abnormality. Spleen: Within normal limits in size and appearance. Adrenals/Urinary Tract: No masses identified. No evidence of hydronephrosis. Left renal cysts, the larger measuring 13 mm, noted. Stomach/Bowel: A pigtail catheter has been placed within the known left pericolonic abscess with interval decrease in the size of the abscess now measuring 3.4 x 2.7 x 4.6 cm. Extensive colonic diverticulosis is again seen. No evidence of obstruction. Vascular/Lymphatic: No pathologically enlarged lymph nodes. No evidence of abdominal aortic aneurysm. Reproductive: Post hysterectomy.  No suspicious findings. Other: Persistent mild circumferential thickening of the urinary bladder wall. Musculoskeletal:  No suspicious bone lesions identified. IMPRESSION: Interval mild decrease in the size of left pelvic pericolonic abscess, now measuring 3.4 x 2.7 x 4.6 cm, with appropriate positioning of the pigtail draining catheter. Extensive left colonic diverticulosis. Interval development of bilateral small pleural effusions and subsegmental atelectasis. Electronically Signed   By: Linwood Dibbles.D.  On: 03/18/2016 12:21    Anti-infectives: Anti-infectives    Start     Dose/Rate Route Frequency Ordered Stop   03/18/16 1000  ertapenem (INVANZ) 1 g in sodium chloride 0.9 % 50 mL IVPB     1 g 100 mL/hr over 30 Minutes Intravenous Every 24 hours 03/18/16 0917     03/15/16  2200  imipenem-cilastatin (PRIMAXIN) 500 mg in sodium chloride 0.9 % 100 mL IVPB  Status:  Discontinued     500 mg 200 mL/hr over 30 Minutes Intravenous Every 8 hours 03/15/16 1810 03/18/16 0916   03/13/16 2200  levofloxacin (LEVAQUIN) IVPB 750 mg  Status:  Discontinued     750 mg 100 mL/hr over 90 Minutes Intravenous Every 24 hours 03/12/16 2316 03/15/16 1810   03/13/16 0800  metroNIDAZOLE (FLAGYL) IVPB 500 mg  Status:  Discontinued     500 mg 100 mL/hr over 60 Minutes Intravenous Every 8 hours 03/13/16 0737 03/15/16 1810   03/12/16 2130  metroNIDAZOLE (FLAGYL) IVPB 500 mg     500 mg 100 mL/hr over 60 Minutes Intravenous  Once 03/12/16 2115 03/12/16 2228   03/12/16 2115  levofloxacin (LEVAQUIN) IVPB 750 mg     750 mg 100 mL/hr over 90 Minutes Intravenous  Once 03/12/16 2115 03/12/16 2320       Assessment/Plan Sigmoid diverticulitis with abscess - IR removed pigtail drain yesterday 03/18/16 and recommended no IR f/u - aspirate grew ESBL, antibiotics changed to imipenem 03/15/16. Per note ID Dr. Johnnye Sima recommends 2 weeks IV Invanz  Pancreatic lesion - found incidentally on CT, MRI's done yesterday. Reports fat deposition within the pancreatic head and no suspicious pancreatic lesion. No further workup planned.  FEN: soft diet VTE - Lovenox  Plan: tolerating diet. Per ID will need 2 weeks IV Invanz. Ok to discharge from general surgery stand point, we will sign off. Follow-up scheduled with Dr. Barry Dienes in 2 weeks    LOS: 7 days    [name] , [facility] Surgery 03/19/2016, 9:16 AM Pager: [phone] Consults: [phone] Mon-Fri 7:00 am-4:30 pm Sat-Sun 7:00 am-11:30 am

## 2016-03-19 NOTE — Discharge Instructions (Signed)
Follow with Primary MD WEBB, CAROL D, MD in 7 days   Get CBC, CMP, 2 view Chest X ray checked  by Primary MD or SNF MD in 5-7 days ( we routinely change or add medications that can affect your baseline labs and fluid status, therefore we recommend that you get the mentioned basic workup next visit with your PCP, your PCP may decide not to get them or add new tests based on their clinical decision)   Activity: As tolerated with Full fall precautions use walker/cane & assistance as needed   Disposition Home    Diet:   Soft.   On your next visit with your primary care physician please Get Medicines reviewed and adjusted.   Please request your Prim.MD to go over all Hospital Tests and Procedure/Radiological results at the follow up, please get all Hospital records sent to your Prim MD by signing hospital release before you go home.   If you experience worsening of your admission symptoms, develop shortness of breath, life threatening emergency, suicidal or homicidal thoughts you must seek medical attention immediately by calling 911 or calling your MD immediately  if symptoms less severe.  You Must read complete instructions/literature along with all the possible adverse reactions/side effects for all the Medicines you take and that have been prescribed to you. Take any new Medicines after you have completely understood and accpet all the possible adverse reactions/side effects.   Do not drive, operate heavy machinery, perform activities at heights, swimming or participation in water activities or provide baby sitting services if your were admitted for syncope or siezures until you have seen by Primary MD or a Neurologist and advised to do so again.  Do not drive when taking Pain medications.    Do not take more than prescribed Pain, Sleep and Anxiety Medications  Special Instructions: If you have smoked or chewed Tobacco  in the last 2 yrs please stop smoking, stop any regular Alcohol   and or any Recreational drug use.  Wear Seat belts while driving.   Please note  You were cared for by a hospitalist during your hospital stay. If you have any questions about your discharge medications or the care you received while you were in the hospital after you are discharged, you can call the unit and asked to speak with the hospitalist on call if the hospitalist that took care of you is not available. Once you are discharged, your primary care physician will handle any further medical issues. Please note that NO REFILLS for any discharge medications will be authorized once you are discharged, as it is imperative that you return to your primary care physician (or establish a relationship with a primary care physician if you do not have one) for your aftercare needs so that they can reassess your need for medications and monitor your lab values.

## 2016-03-19 NOTE — Progress Notes (Signed)
D/c instructions reviewed with pt at 1345, copy of instructions given to pt. Pt waited for IV team to cap off PICC line for home use for home IV antibiotics with home health. After IV team capped and flushed PICC, at 1424 pt was d/c'd via wheelchair with belongings escorted by hospital volunteer, family at main entrance (A).

## 2016-03-20 ENCOUNTER — Other Ambulatory Visit (HOSPITAL_COMMUNITY): Payer: Self-pay | Admitting: General Surgery

## 2016-03-20 DIAGNOSIS — K5720 Diverticulitis of large intestine with perforation and abscess without bleeding: Secondary | ICD-10-CM

## 2016-03-21 ENCOUNTER — Encounter (HOSPITAL_COMMUNITY): Payer: Self-pay | Admitting: Interventional Radiology

## 2016-03-26 ENCOUNTER — Ambulatory Visit: Payer: BLUE CROSS/BLUE SHIELD | Admitting: Cardiology

## 2016-04-01 ENCOUNTER — Encounter: Payer: Self-pay | Admitting: Physician Assistant

## 2016-04-01 ENCOUNTER — Other Ambulatory Visit: Payer: BLUE CROSS/BLUE SHIELD

## 2016-04-01 ENCOUNTER — Ambulatory Visit (INDEPENDENT_AMBULATORY_CARE_PROVIDER_SITE_OTHER): Payer: BLUE CROSS/BLUE SHIELD | Admitting: Physician Assistant

## 2016-04-01 ENCOUNTER — Other Ambulatory Visit (INDEPENDENT_AMBULATORY_CARE_PROVIDER_SITE_OTHER): Payer: BLUE CROSS/BLUE SHIELD

## 2016-04-01 VITALS — BP 110/60 | HR 81 | Ht 65.0 in | Wt 120.0 lb

## 2016-04-01 DIAGNOSIS — K5780 Diverticulitis of intestine, part unspecified, with perforation and abscess without bleeding: Secondary | ICD-10-CM

## 2016-04-01 LAB — BASIC METABOLIC PANEL
BUN: 11 mg/dL (ref 6–23)
CALCIUM: 10 mg/dL (ref 8.4–10.5)
CO2: 30 mEq/L (ref 19–32)
CREATININE: 0.86 mg/dL (ref 0.40–1.20)
Chloride: 101 mEq/L (ref 96–112)
GFR: 70.41 mL/min (ref >60.00)
GLUCOSE: 104 mg/dL — AB (ref 70–99)
Potassium: 4.3 mEq/L (ref 3.5–5.1)
SODIUM: 138 meq/L (ref 135–145)

## 2016-04-01 LAB — CBC WITH DIFFERENTIAL/PLATELET
BASOS PCT: 0.5 % (ref 0.0–3.0)
Basophils Absolute: 0.1 10*3/uL (ref 0.0–0.1)
EOS PCT: 9.6 % — AB (ref 0.0–5.0)
Eosinophils Absolute: 1.1 10*3/uL — ABNORMAL HIGH (ref 0.0–0.7)
HCT: 41.3 % (ref 36.0–46.0)
HEMOGLOBIN: 13.8 g/dL (ref 12.0–15.0)
LYMPHS ABS: 1.9 10*3/uL (ref 0.7–4.0)
Lymphocytes Relative: 17.4 % (ref 12.0–46.0)
MCHC: 33.4 g/dL (ref 30.0–36.0)
MCV: 84.4 fl (ref 78.0–100.0)
MONO ABS: 0.7 10*3/uL (ref 0.1–1.0)
MONOS PCT: 6.4 % (ref 3.0–12.0)
NEUTROS PCT: 66.1 % (ref 43.0–77.0)
Neutro Abs: 7.3 10*3/uL (ref 1.4–7.7)
Platelets: 319 10*3/uL (ref 150.0–400.0)
RBC: 4.9 Mil/uL (ref 3.87–5.11)
RDW: 14.1 % (ref 11.5–15.5)
WBC: 11 10*3/uL — ABNORMAL HIGH (ref 4.0–10.5)

## 2016-04-01 LAB — SEDIMENTATION RATE: Sed Rate: 63 mm/hr — ABNORMAL HIGH (ref 0–30)

## 2016-04-01 MED ORDER — NYSTATIN 100000 UNIT/ML MT SUSP: 5.0000 mL | Freq: Four times a day (QID) | OROMUCOSAL | 0 refills | Status: DC

## 2016-04-01 NOTE — Progress Notes (Signed)
Agree with CT reassessment and surgical follow-up. Will need definitive surgery. Hopefully elective if infection resolved

## 2016-04-01 NOTE — Progress Notes (Signed)
Subjective:    Patient ID: Dorothy White, female    DOB: 19-Dec-1950, 65 y.o.   MRN: [id number]  HPI [name] is a pleasant 65 year old white female known to Dr. Henrene Pastor who has history of severe diverticular disease and recurrent diverticulosis. Last colonoscopy was done in May 2015 with finding of severe diverticular disease of the entire colon worse on the left. There were no polyps. Patient was in today after hospitalization 816 through 03/21/2016 for acute diverticulitis complicated by abscess. She had been started on Cipro and Flagyl by her PCP about a week prior to admission and says her pain does continue to get worse. On admission she had a sigmoid diverticulitis with contained paracolonic abscess contiguous to the bladder and an ill-defined low attenuation lesion in the head of the pancreas with dilation of the main pancreatic duct could not exclude an adenocarcinoma  Subsequent MRI was done on 8/17 that showed a variant of pancreatic to be some with a prominent dorsal duct and focal fat in the head of the pancreas as well as mild hepatomegaly. Patient was cared for by the hospitalist and surgery during her admission and was not seen by GI. She underwent IR drainage of the abscess on 8/17. Repeat CT on 03/18/2016 showed decrease in size of the abscess to 3.4 x 2.7 x 4.6 cm. She then had a drain study done on 03/21/2016 showing the abscess cavity to be collapsed and the drain was removed. Culture from the abscess grew abundant Escherichia coli (ESBL) She did have PICC line placed and was discharged home on Invanz which she completed for 7 days after hospitalization. She is to follow up with Dr. Barry Dienes next week. Patient says that she feels better than when she was hospitalized but remains very fatigued and still hurts in her lower abdomen. She complains of pressure with standing and walking. Her appetite has been poor, though she does not feel that eating increases her discomfort at all. Also  has some mild pressure type sensation with urination. No fever chills or sweats. She says food does not taste good at all.  Review of Systems Pertinent positive and negative review of systems were noted in the above HPI section.  All other review of systems was otherwise negative.  Outpatient Encounter Prescriptions as of 04/01/2016  Medication Sig  . Ascorbic Acid (VITAMIN C PO) Take 1 tablet by mouth every evening.  Marland Kitchen aspirin EC 81 MG tablet Take 81 mg by mouth every other day.  . busPIRone (BUSPAR) 5 MG tablet Take 1 tablet by mouth 3 (three) times daily.  . calcium-vitamin D (OSCAL WITH D) 500-200 MG-UNIT per tablet Take 1 tablet by mouth daily with breakfast.  . Cholecalciferol (VITAMIN D PO) Take 1 capsule by mouth every evening.  . clonazePAM (KLONOPIN) 1 MG tablet Take 1 tablet (1 mg total) by mouth daily as needed (anxiety/sleep.). (Patient taking differently: Take 1 mg by mouth 2 (two) times daily as needed for anxiety (anxiety/sleep.). )  . ertapenem 1 g in sodium chloride 0.9 % 50 mL Inject 1 g into the vein daily.  Marland Kitchen FLUoxetine (PROZAC) 20 MG capsule Take 60 mg by mouth daily.   Marland Kitchen levocetirizine (XYZAL ALLERGY 24HR) 5 MG tablet Take 5 mg by mouth every evening.  . LUTEIN PO Take 1 capsule by mouth every evening.  . mometasone (NASONEX) 50 MCG/ACT nasal spray Place 2 sprays into the nose daily.  . Multiple Vitamins-Minerals (ONE-A-DAY WOMENS 50 PLUS PO) Take 1 tablet by  mouth every evening.  Marland Kitchen oxyCODONE-acetaminophen (PERCOCET) 10-325 MG tablet Take 1 tablet by mouth 4 (four) times daily as needed.  . RELPAX 40 MG tablet Take 1 tablet by mouth as needed for migraine and may take 1 additional tablet in 2 hours if no relief  . traZODone (DESYREL) 100 MG tablet Take 200 mg by mouth at bedtime.   . vitamin E 100 UNIT capsule Take 100 Units by mouth every evening.  . nystatin (MYCOSTATIN) 100000 UNIT/ML suspension Take 5 mLs (500,000 Units total) by mouth 4 (four) times daily.   No  facility-administered encounter medications on file as of 04/01/2016.    Allergies  Allergen Reactions  . Penicillins Hives    Has patient had a PCN reaction causing immediate rash, facial/tongue/throat swelling, SOB or lightheadedness with hypotension: Yes Has patient had a PCN reaction causing severe rash involving mucus membranes or skin necrosis: No Has patient had a PCN reaction that required hospitalization No Has patient had a PCN reaction occurring within the last 10 years: No If all of the above answers are "NO", then may proceed with Cephalosporin use.    Patient Active Problem List   Diagnosis Date Noted  . Infection due to ESBL-producing Escherichia coli/Diverticular Abscess 03/15/2016  . Diverticulitis 03/12/2016  . Chronic pain syndrome 03/12/2016  . Lesion of pancreas 03/12/2016  . History of chest pain 03/12/2016  . Hypokalemia 03/12/2016  . 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 TIA (transient ischemic attack) 12/26/2015  . DOE (dyspnea on exertion) 12/26/2015  . Right sided weakness 12/17/2015  . Ataxia 12/17/2015  . Shoulder fracture 02/15/2015  . Chronic headache 10/11/2013  . Weakness generalized 08/12/2013  . Dizziness and giddiness 08/12/2013  . Abnormality of gait 07/11/2013  . Hyperlipidemia 05/07/2007  . COMMON MIGRAINE 05/07/2007  . PREMATURE VENTRICULAR CONTRACTIONS, FREQUENT 05/07/2007  . GERD 05/07/2007  . DIVERTICULOSIS, COLON 05/07/2007  . 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 OSTEOPENIA 05/07/2007  . Anxiety and depression 03/24/2007  . HEMORRHOIDS 03/24/2007  . ALLERGIC RHINITIS 03/24/2007  . LOW BACK PAIN 03/24/2007  . MIGRAINES, HX OF 03/24/2007   Social History   Social History  . Marital status: Divorced    Spouse name: N/A  . Number of children: 0  . Years of education: 12   Occupational History  . retired Financial trader    Retired  .  [place] History Main Topics  . Smoking status: Current Some Day Smoker    Types: Cigarettes    Last attempt to quit: 06/30/2011  . Smokeless tobacco: Never Used     Comment: former daily smoker, now rarely  . Alcohol use No     Comment: denies   . Drug use: No  . Sexual activity: No   Other Topics Concern  . Not on file   Social History Narrative   Patient lives at home alone.    Patient is retired.   Education- High school.   Right handed.   Patient drinks one cup of coffee and a big glass of ice tea.    Ms. Nitti family history includes Diabetes in her paternal grandmother; Esophageal cancer in her brother; Heart attack (age of onset: 35) in her father; Hypertension in her brother; Lung cancer (age of onset: 88) in her mother.      Objective:    Vitals:   04/01/16 0840  BP: 110/60  Pulse: 81    Physical Exam  well-developed white female in no acute distress, fatigued-appearing blood pressure 110/60 pulse 81 BMI 19.9. HEENT ;nontraumatic normocephalic EOMI PERRLA sclera anicteric, Cardiovascular; regular rate and rhythm with S1-S2 no murmur or gallop, Pulmonary ;clear bilaterally. Abdomen; soft, bowel sounds are present there is no palpable mass or hepatosplenomegaly she remains quite tender in the left lower quadrant no guarding or rebound, Rectal; exam not done, Ext;no clubbing cyanosis or edema skin warm and dry, Neuropsych ;mood and affect appropriate       Assessment & Plan:   #30 65 year old female seen today in post hospital follow-up after admission for acute sigmoid diverticulitis with abscess. Patient required IR drainage. Drain was removed on 03/21/2016. Patient was also discharged home on IV Invanz which she completed 4 days ago. She continues to complain of left lower quadrant pain and has been quite fatigued. Will rule out persistent diverticulitis and/or reaccumulation of abscess #2 Pan diverticulosis #3 history of TIA #4 chronic pain  syndrome #5  Pancreas divisum -found on recent MRI-no evidence for dilated pancreatic duct or pancreatic head lesion on MRI Plan; CBC with differential today, BMET Schedule for CT of the abdomen and pelvis with contrast Start Mycostatin oral suspension 5 mL 4 times daily swish and swallow 10 days Patient has follow-up with Dr. Barry Dienes on September 11, if CT is negative would anticipate PICC line could be removed. Last colonoscopy was done in May 2015 and does not need to be repeated at this time.      Amy S Esterwood PA-C 04/01/2016   Cc: Maurice Small, MD

## 2016-04-01 NOTE — Patient Instructions (Addendum)
Please go to the basement level to have your labs drawn.  We sent a prescription to [facility] drive for the Mycostatin oral suspension.    You have been scheduled for a CT scan of the abdomen and pelvis at [place] ([address]---this is in the same building as Press [facility]).   You are scheduled on 04-04-2016 at 9:00 am. You should arrive at 8:45 am   to your appointment time for registration. Please follow the written instructions below on the day of your exam:  WARNING: IF YOU ARE ALLERGIC TO IODINE/X-RAY DYE, PLEASE NOTIFY RADIOLOGY IMMEDIATELY AT [phone]! YOU WILL BE GIVEN A 13 HOUR PREMEDICATION PREP.  1) Do not eat or drink anything after 5:00 am (4 hours prior to your test) 2) You have been given 2 bottles of oral contrast to drink. The solution may taste               better if refrigerated, but do NOT add ice or any other liquid to this solution. Shake             well before drinking.    Drink 1 bottle of contrast @ 7:00 am (2 hours prior to your exam)  Drink 1 bottle of contrast @ 8:00 am, (1 hour prior to your exam)  You may take any medications as prescribed with a small amount of water except for the following: Metformin, Glucophage, Glucovance, Avandamet, Riomet, Fortamet, Actoplus Met, Janumet, Glumetza or Metaglip. The above medications must be held the day of the exam AND 48 hours after the exam.  The purpose of you drinking the oral contrast is to aid in the visualization of your intestinal tract. The contrast solution may cause some diarrhea. Before your exam is started, you will be given a small amount of fluid to drink. Depending on your individual set of symptoms, you may also receive an intravenous injection of x-ray contrast/dye. Plan on being at [facility] for 30 minutes or long, depending on the type of exam you are having performed.  If you have any questions regarding your exam or if you need to reschedule, you may call  the CT department at [phone] between the hours of 8:00 am and 5:00 pm, Monday-Friday.  ________________________________________________________________________

## 2016-04-02 ENCOUNTER — Other Ambulatory Visit: Payer: Self-pay | Admitting: Physician Assistant

## 2016-04-03 ENCOUNTER — Ambulatory Visit (INDEPENDENT_AMBULATORY_CARE_PROVIDER_SITE_OTHER)
Admission: RE | Admit: 2016-04-03 | Discharge: 2016-04-03 | Disposition: A | Discharge status: Home / Self Care | Payer: BLUE CROSS/BLUE SHIELD | Source: Ambulatory Visit | Attending: Physician Assistant | Admitting: Physician Assistant

## 2016-04-03 DIAGNOSIS — K5780 Diverticulitis of intestine, part unspecified, with perforation and abscess without bleeding: Secondary | ICD-10-CM

## 2016-04-03 MED ORDER — IOPAMIDOL (ISOVUE-300) INJECTION 61%
100.0000 mL | Freq: Once | INTRAVENOUS | Status: AC | PRN
  Administered 2016-04-03: 100 mL via INTRAVENOUS

## 2016-04-04 ENCOUNTER — Inpatient Hospital Stay: Admission: RE | Admit: 2016-04-04 | Payer: BLUE CROSS/BLUE SHIELD | Source: Ambulatory Visit

## 2016-04-04 ENCOUNTER — Encounter: Payer: Self-pay | Admitting: Cardiology

## 2016-04-08 ENCOUNTER — Other Ambulatory Visit: Payer: Self-pay

## 2016-04-08 MED ORDER — SODIUM CHLORIDE 0.9 % IV SOLN
1.0000 g | INTRAVENOUS | 0 refills | Status: AC
Start: 2016-04-08 — End: 2016-04-13

## 2016-04-10 ENCOUNTER — Other Ambulatory Visit: Payer: Self-pay | Admitting: General Surgery

## 2016-04-10 MED ORDER — GENTAMICIN SULFATE 40 MG/ML IJ SOLN
5.0000 mg/kg | INTRAVENOUS | Status: AC
  Administered 2016-04-25: 270 mg via INTRAVENOUS

## 2016-04-10 MED ORDER — DEXTROSE 5 % IV SOLN
900.0000 mg | INTRAVENOUS | Status: AC
  Administered 2016-04-25: 900 mg via INTRAVENOUS

## 2016-04-15 ENCOUNTER — Telehealth: Payer: Self-pay | Admitting: Physician Assistant

## 2016-04-15 NOTE — Telephone Encounter (Signed)
Patient states she is feeling well. She is scheduled for surgery with Dr Barry Dienes on 04/25/16. She would like to know if the [facility] line will be needed for this. Call to [facility]. The nurse will contact Dr Barry Dienes and get back with [place] on this.

## 2016-04-17 NOTE — Telephone Encounter (Signed)
Called to [facility]. Spoke with Abigail Butts. She will contact [place] and advise on the [facility] line.

## 2016-04-22 NOTE — Pre-Procedure Instructions (Addendum)
Dorothy White  04/22/2016      [facility] - [place], [place] [address] [place] [address] Phone: [phone] Fax: [phone]    Your procedure is scheduled on Friday  04/25/16  Report to [facility] Admitting at 530 A.M.  Call this number if you have problems the morning of surgery:  [phone]   Remember:  Do not eat food or drink liquids after midnight.  Take these medicines the morning of surgery with A SIP OF WATER buspar,prozac, clonazepam and oxycodone if needed  STOP all herbel meds, nsaids (aleve,naproxen,advil,ibuprofen)prior to surgery starting Now including all vitamins(C,E,D,cal-D,lutein,multi  Aspirin per dr   Lazaro Arms not wear jewelry, make-up or nail polish.  Do not wear lotions, powders, or perfumes, or deoderant.  Do not shave 48 hours prior to surgery.  Men may shave face and neck.  Do not bring valuables to the hospital.  [facility] is not responsible for any belongings or valuables.  Contacts, dentures or bridgework may not be worn into surgery.  Leave your suitcase in the car.  After surgery it may be brought to your room.  For patients admitted to the hospital, discharge time will be determined by your treatment team.  Patients discharged the day of surgery will not be allowed to drive home.   Name and phone number of your driver:    Special instructions:  [name] - Preparing for Surgery  Before surgery, you can play an important role.  Because skin is not sterile, your skin needs to be as free of germs as possible.  You can reduce the number of germs on you skin by washing with CHG (chlorahexidine gluconate) soap before surgery.  CHG is an antiseptic cleaner which kills germs and bonds with the skin to continue killing germs even after washing.  Please DO NOT use if you have an allergy to CHG or antibacterial soaps.  If your skin becomes  reddened/irritated stop using the CHG and inform your nurse when you arrive at Short Stay.  Do not shave (including legs and underarms) for at least 48 hours prior to the first CHG shower.  You may shave your face.  Please follow these instructions carefully:   1.  Shower with CHG Soap the night before surgery and the                                morning of Surgery.  2.  If you choose to wash your hair, wash your hair first as usual with your       normal shampoo.  3.  After you shampoo, rinse your hair and body thoroughly to remove the                      Shampoo.  4.  Use CHG as you would any other liquid soap.  You can apply chg directly       to the skin and wash gently with scrungie or a clean washcloth.  5.  Apply the CHG Soap to your body ONLY FROM THE NECK DOWN.        Do not use on open wounds or open sores.  Avoid contact with your eyes,       ears, mouth and genitals (private parts).  Wash genitals (private parts)  with your normal soap.  6.  Wash thoroughly, paying special attention to the area where your surgery        will be performed.  7.  Thoroughly rinse your body with warm water from the neck down.  8.  DO NOT shower/wash with your normal soap after using and rinsing off       the CHG Soap.  9.  Pat yourself dry with a clean towel.            10.  Wear clean pajamas.            11.  Place clean sheets on your bed the night of your first shower and do not        sleep with pets.  Day of Surgery  Do not apply any lotions/deoderants the morning of surgery.  Please wear clean clothes to the hospital/surgery center.    Please read over the  fact sheets that you were given.

## 2016-04-23 ENCOUNTER — Encounter (HOSPITAL_COMMUNITY): Payer: Self-pay

## 2016-04-23 ENCOUNTER — Encounter (HOSPITAL_COMMUNITY)
Admission: RE | Admit: 2016-04-23 | Discharge: 2016-04-23 | Disposition: A | Discharge status: Home / Self Care | Payer: Medicare Other | Source: Ambulatory Visit | Attending: General Surgery | Admitting: General Surgery

## 2016-04-23 LAB — CBC WITH DIFFERENTIAL/PLATELET
BASOS ABS: 0 10*3/uL (ref 0.0–0.1)
BASOS PCT: 1 %
EOS ABS: 0.3 10*3/uL (ref 0.0–0.7)
EOS PCT: 4 %
HCT: 41.2 % (ref 36.0–46.0)
Hemoglobin: 12.8 g/dL (ref 12.0–15.0)
Lymphocytes Relative: 23 %
Lymphs Abs: 1.7 10*3/uL (ref 0.7–4.0)
MCH: 27.6 pg (ref 26.0–34.0)
MCHC: 31.1 g/dL (ref 30.0–36.0)
MCV: 88.8 fL (ref 78.0–100.0)
MONO ABS: 0.6 10*3/uL (ref 0.1–1.0)
Monocytes Relative: 9 %
Neutro Abs: 4.6 10*3/uL (ref 1.7–7.7)
Neutrophils Relative %: 63 %
PLATELETS: 288 10*3/uL (ref 150–400)
RBC: 4.64 MIL/uL (ref 3.87–5.11)
RDW: 14.4 % (ref 11.5–15.5)
WBC: 7.3 10*3/uL (ref 4.0–10.5)

## 2016-04-23 LAB — BASIC METABOLIC PANEL
ANION GAP: 11 (ref 5–15)
BUN: 10 mg/dL (ref 6–20)
CALCIUM: 9.8 mg/dL (ref 8.9–10.3)
CHLORIDE: 106 mmol/L (ref 101–111)
CO2: 24 mmol/L (ref 22–32)
CREATININE: 0.78 mg/dL (ref 0.44–1.00)
Glucose, Bld: 94 mg/dL (ref 65–99)
Potassium: 4.1 mmol/L (ref 3.5–5.1)
SODIUM: 141 mmol/L (ref 135–145)

## 2016-04-23 LAB — PROTIME-INR
INR: 0.96
PROTHROMBIN TIME: 12.7 s (ref 11.4–15.2)

## 2016-04-23 LAB — TYPE AND SCREEN
ABO/RH(D): O POS
Antibody Screen: NEGATIVE

## 2016-04-23 LAB — ABO/RH: ABO/RH(D): O POS

## 2016-04-23 NOTE — Progress Notes (Signed)
Patient did not have antibiotics for bowel prep. Office called spoke with sylvia. To call antibiotics in to pts pharm. Bowel prep sheel reviewed with patient and sent home with her.

## 2016-04-24 ENCOUNTER — Encounter (HOSPITAL_COMMUNITY): Payer: Self-pay

## 2016-04-24 LAB — HEMOGLOBIN A1C
HEMOGLOBIN A1C: 5.1 % (ref 4.8–5.6)
MEAN PLASMA GLUCOSE: 100 mg/dL

## 2016-04-24 NOTE — Progress Notes (Signed)
Anesthesia chart review: Patient is a 65 year old female scheduled for laparoscopic sigmoid colectomy, possible ostomy on 04/25/2016 by Dr. Barry Dienes. Diagnosis: recurrent diverticulitis.  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 Admission 8/16-8/23/16 for sigmoid diverticulitis with pericolonic abscess, s/p percutaneous drain. Admission 7/26-7/27/17 with chest pain, negative cardiac enzymes, s/p cath showing only mild CAD. Admission 5/22-5/26/17 with ataxia, neurology suspected complicated migraine.   PCP is Dr. Maurice Small. Cardiologist is Dr. Ena Dawley.  Meds include aspirin 81 mg, BuSpar, Zyrtec, Klonopin, Prozac, Nasonex, Relpax, trazodone, vitamin E, Percocet  BP (!) 123/57   Pulse 97   Temp 36.7 C   Resp 20   Ht 5\' 5"  (1.651 m)   Wt 122 lb 14.4 oz (55.7 kg)   SpO2 98%   BMI 20.45 kg/m   02/15/16 EKG: NSR, ST/T wave abnormality, consider anterolateral ischemia.  02/20/16 LHC:  Mid LAD lesion, 25 %stenosed.  The left ventricular systolic function is normal.  LV end diastolic pressure is normal.  The left ventricular ejection fraction is 55-65% by visual estimate.  There is no aortic valve stenosis.  Continue preventive therapy.  01/03/16 Nuclear stress test:  The left ventricular ejection fraction is hyperdynamic (>65%).  Nuclear stress EF: 67%.  There was < 48mm of horizontal ST segment depression with T wave inversions noted in the inferolateral leads during Lexiscan infusion.  There is a medium defect of moderate severity present in the basal inferoseptal, mid inferoseptal and apical anterior location. The defect is non-reversible. No ischemia noted.  This is a low risk  study.  12/18/15 Echo: Study Conclusions - Left ventricle: The cavity size was normal. Systolic function was   mildly to moderately reduced. The estimated ejection fraction was   in the range of 40% to 45%. Moderate hypokinesis of the   basalinferior myocardium. - Pulmonary arteries: Systolic pressure was mildly to moderately   increased. PA peak pressure: 40 mm Hg (S).  11/2015 Cardiac event monitor:  Normal 30-day event monitor. No pauses or arrhythmias.  No atrial fibrillation detected.  Symptoms don't correlate with any arrhythmia. Normal 30-day event monitor.  12/18/15 Carotid U/S: Summary: - The vertebral arteries appear patent with antegrade flow. - Findings consistent with 1- 39 percent stenosis involving the   right internal carotid artery and the left internal carotid   artery.  12/19/15 MRA Neck: IMPRESSION: 1. Focal irregularity without significant stenosis in the mid cervical internal carotid artery bilaterally. This is most consistent with fibromuscular dysplasia. 2. Minimal atherosclerotic changes in the cavernous internal carotid arteries bilaterally without significant stenosis.  12/19/15 MRI Brain: IMPRESSION: 1. No acute intracranial abnormality. 2. Unchanged, mild cerebral white matter disease, nonspecific.  12/19/15 MRI c-spine: IMPRESSION: 1. The most significant level is C6-7 with moderate left central and left foraminal stenosis. 2. Mild right foraminal narrowing at C6-7. 3. Leftward disc osteophyte complex with mild left central and left foraminal stenosis at C5-6. 4. Rightward disc osteophyte complex and asymmetric right-sided facet hypertrophy at C3-4 without significant stenosis.  01/23/16 CXR: IMPRESSION: No active cardiopulmonary disease.  Preoperative labs noted. A1c 5.1. T&S done.  She had recent cardiac work-up. If no acute changes then I anticipate that she can proceed as planned.  [facility] Short Stay  Center/Anesthesiology Phone [phone] 04/24/2016 9:58 AM

## 2016-04-24 NOTE — H&P (Signed)
Dorothy White 04/07/2016 10:53 AM Location: [facility] Patient #: [id number] DOB: August 18, 1950 Divorced / Language: Cleophus Molt / Race: White Female   History of Present Illness Stark Klein MD; 04/07/2016 11:23 AM) Patient words: Pancreatic lesion.  The patient is a 65 year old female who presents with diverticulitis. Patient is a 65 year old female that I saw in the hospital for diverticulitis of the sigmoid colon with abscess. She was admitted 8/16 and discharged 8/23. She received a percutaneous drain while in house. Her abscess cavity collapsed and the drain was removed prior to discharge. She had Escherichia coli which was extended spectrum beta-lactamase positive. She was sent home on Invanz. She has been off of antibiotics for approximately a week. She has had no recurrent fevers or night sweats. She is tolerating her low residue diet without left lower quadrant pain or changes in bowel habits. She is not having chest pain.   Allergies Patsey Berthold, CMA; 04/07/2016 10:54 AM) Penicillins Hives.  Medication History Patsey Berthold, CMA; 04/07/2016 10:56 AM) FLUoxetine HCl (60MG  Tablet, Oral) Active. TraZODone HCl (100MG  Tablet, 200 mg Oral) Active. Lutein (Oral) Specific dose unknown - Active. Multivitamin Adult (Oral) Active. Vitamin E (Oral) Specific dose unknown - Active. Vitamin C (Oral) Specific dose unknown - Active. Calcium (500MG  Tablet, Oral) Active. Medications Reconciled    Review of Systems Hunt Oris; 04/07/2016 11:25 AM) All other systems negative  Vitals Patsey Berthold CMA; 04/07/2016 10:57 AM) 04/07/2016 10:56 AM Weight: 127.2 lb Height: 65in Height was reported by patient. Body Surface Area: 1.63 m Body Mass Index: 21.17 kg/m  Temp.: 97.32F  Pulse: 91 (Regular)  BP: 142/90 (Sitting, Left Arm, Standard)       Physical Exam Stark Klein MD; 04/07/2016 11:24 AM) General Mental Status-Alert. General  Appearance-Consistent with stated age. Hydration-Well hydrated. Voice-Normal.  Chest and Lung Exam Chest and lung exam reveals -quiet, even and easy respiratory effort with no use of accessory muscles. Inspection Chest Wall - Normal. Back - normal.  Cardiovascular Cardiovascular examination reveals -normal pedal pulses bilaterally. Note: regular rate and rhythm  Abdomen Note: soft, non distended, very mild tenderness/fullness LLQ.   Peripheral Vascular Upper Extremity Inspection - Bilateral - Normal - No Clubbing, No Cyanosis, No Edema, Pulses Intact. Lower Extremity Palpation - Edema - Bilateral - No edema.  Musculoskeletal Global Assessment -Note: no gross deformities.  Normal Exam - Left-Upper Extremity Strength Normal and Lower Extremity Strength Normal. Normal Exam - Right-Upper Extremity Strength Normal and Lower Extremity Strength Normal.    Assessment & Plan Stark Klein MD; 04/07/2016 11:27 AM) DIVERTICULITIS OF LARGE INTESTINE WITH ABSCESS, UNSPECIFIED BLEEDING STATUS (K57.20) Impression: Patient appears to be doing well following her discharge. She has been off the antibiotics for a week without recurrence of symptoms. She had a repeat CT scan last week that was negative for recurrent abscess. She did still have some inflammation of her sigmoid. I think it is too early to tell at this point if she is going to need surgery. I advised her to stay on the low residue diet for this time period. I discussed that if she starts having fevers, night sweats, recurrent pain, changes in bowel habits, that she should call us earlier. I had advised her if she had severe sudden pain and she should go to the emergency department. She was given a pamphlet regarding diverticulitis and possible surgery. I will see her back in 4 weeks.  Surgery briefly discussed.  Pt is up to date with colonoscopy. Current Plans Follow  up with [place] in the office in 1 month.  Call us  sooner as needed.  We will send order to d/c PICC line.  I will send message to Dr. [name] regarding your cardiac risk for possible surgery in the future.  Pt Education - Pamphlet Given - Diverticulosis and Diverticulitis: discussed with patient and provided information. CORONARY ARTERY DISEASE (I25.10) Impression: Patient has follow-up with Dr. [name] arranged to discuss her hospitalization in July. I have sent Dr. [name] the message regarding risk stratification and medication optimization in case she needs surgery. She seems asymptomatic at this time.    Signed by Stark Klein, MD (04/07/2016 11:27 AM)

## 2016-04-25 ENCOUNTER — Encounter (HOSPITAL_COMMUNITY): Payer: Self-pay | Admitting: General Practice

## 2016-04-25 ENCOUNTER — Inpatient Hospital Stay (HOSPITAL_COMMUNITY): Payer: Medicare Other | Admitting: Anesthesiology

## 2016-04-25 ENCOUNTER — Inpatient Hospital Stay (HOSPITAL_COMMUNITY)
Admission: RE | Admit: 2016-04-25 | Discharge: 2016-04-30 | DRG: 330 | Disposition: A | Discharge status: Home / Self Care | Payer: Medicare Other | Source: Ambulatory Visit | Attending: General Surgery | Admitting: General Surgery

## 2016-04-25 ENCOUNTER — Inpatient Hospital Stay (HOSPITAL_COMMUNITY): Payer: Medicare Other | Admitting: Vascular Surgery

## 2016-04-25 ENCOUNTER — Encounter (HOSPITAL_COMMUNITY): Admission: RE | Disposition: A | Discharge status: Home / Self Care | Payer: Self-pay | Source: Ambulatory Visit

## 2016-04-25 DIAGNOSIS — E785 Hyperlipidemia, unspecified: Secondary | ICD-10-CM | POA: Diagnosis present

## 2016-04-25 DIAGNOSIS — K5732 Diverticulitis of large intestine without perforation or abscess without bleeding: Secondary | ICD-10-CM

## 2016-04-25 DIAGNOSIS — F419 Anxiety disorder, unspecified: Secondary | ICD-10-CM | POA: Diagnosis present

## 2016-04-25 DIAGNOSIS — Q8909 Congenital malformations of spleen: Secondary | ICD-10-CM | POA: Diagnosis not present

## 2016-04-25 DIAGNOSIS — G8929 Other chronic pain: Secondary | ICD-10-CM | POA: Diagnosis present

## 2016-04-25 DIAGNOSIS — I2510 Atherosclerotic heart disease of native coronary artery without angina pectoris: Secondary | ICD-10-CM | POA: Diagnosis present

## 2016-04-25 DIAGNOSIS — M8580 Other specified disorders of bone density and structure, unspecified site: Secondary | ICD-10-CM | POA: Diagnosis present

## 2016-04-25 DIAGNOSIS — F17200 Nicotine dependence, unspecified, uncomplicated: Secondary | ICD-10-CM | POA: Diagnosis present

## 2016-04-25 DIAGNOSIS — Z8673 Personal history of transient ischemic attack (TIA), and cerebral infarction without residual deficits: Secondary | ICD-10-CM

## 2016-04-25 DIAGNOSIS — K219 Gastro-esophageal reflux disease without esophagitis: Secondary | ICD-10-CM | POA: Diagnosis present

## 2016-04-25 DIAGNOSIS — Z79899 Other long term (current) drug therapy: Secondary | ICD-10-CM | POA: Diagnosis not present

## 2016-04-25 DIAGNOSIS — F329 Major depressive disorder, single episode, unspecified: Secondary | ICD-10-CM | POA: Diagnosis present

## 2016-04-25 HISTORY — DX: Headache, unspecified: R51.9

## 2016-04-25 HISTORY — DX: Headache: R51

## 2016-04-25 HISTORY — PX: LAPAROSCOPIC SIGMOID COLECTOMY: SHX5928

## 2016-04-25 HISTORY — DX: Diverticulitis of large intestine without perforation or abscess without bleeding: K57.32

## 2016-04-25 HISTORY — DX: Basal cell carcinoma of skin of nose: C44.311

## 2016-04-25 HISTORY — DX: Migraine, unspecified, not intractable, without status migrainosus: G43.909

## 2016-04-25 LAB — CBC
HCT: 33 % — ABNORMAL LOW (ref 36.0–46.0)
HEMOGLOBIN: 10.4 g/dL — AB (ref 12.0–15.0)
MCH: 27.7 pg (ref 26.0–34.0)
MCHC: 31.5 g/dL (ref 30.0–36.0)
MCV: 87.8 fL (ref 78.0–100.0)
Platelets: 215 10*3/uL (ref 150–400)
RBC: 3.76 MIL/uL — ABNORMAL LOW (ref 3.87–5.11)
RDW: 14.2 % (ref 11.5–15.5)
WBC: 8.6 10*3/uL (ref 4.0–10.5)

## 2016-04-25 LAB — CREATININE, SERUM
CREATININE: 0.61 mg/dL (ref 0.44–1.00)
GFR calc Af Amer: >60 mL/min (ref >60)
GFR calc non Af Amer: >60 mL/min (ref >60)

## 2016-04-25 SURGERY — COLECTOMY, SIGMOID, LAPAROSCOPIC
Anesthesia: General | Site: Abdomen

## 2016-04-25 MED ORDER — ONDANSETRON HCL 4 MG/2ML IJ SOLN
4.0000 mg | Freq: Four times a day (QID) | INTRAMUSCULAR | Status: DC | PRN
  Administered 2016-04-26 – 2016-04-27 (×2): 4 mg via INTRAVENOUS
  Filled 2016-04-25: qty 2

## 2016-04-25 MED ORDER — BUSPIRONE HCL 5 MG PO TABS
5.0000 mg | ORAL_TABLET | Freq: Three times a day (TID) | ORAL | Status: DC
  Administered 2016-04-25 – 2016-04-30 (×15): 5 mg via ORAL
  Filled 2016-04-25 (×16): qty 1

## 2016-04-25 MED ORDER — ELETRIPTAN HYDROBROMIDE 40 MG PO TABS
40.0000 mg | ORAL_TABLET | ORAL | Status: DC | PRN
  Filled 2016-04-25: qty 1

## 2016-04-25 MED ORDER — ALVIMOPAN 12 MG PO CAPS: 12.0000 mg | ORAL_CAPSULE | Freq: Two times a day (BID) | ORAL | Status: DC

## 2016-04-25 MED ORDER — ONDANSETRON HCL 4 MG/2ML IJ SOLN
INTRAMUSCULAR | Status: DC | PRN
  Administered 2016-04-25: 4 mg via INTRAVENOUS

## 2016-04-25 MED ORDER — PROMETHAZINE HCL 25 MG/ML IJ SOLN
6.2500 mg | INTRAMUSCULAR | Status: AC | PRN
  Administered 2016-04-25 (×2): 6.25 mg via INTRAVENOUS

## 2016-04-25 MED ORDER — KCL IN DEXTROSE-NACL 20-5-0.45 MEQ/L-%-% IV SOLN
INTRAVENOUS | Status: DC
Start: 2016-04-25 — End: 2016-04-30
  Administered 2016-04-25 – 2016-04-29 (×8): via INTRAVENOUS
  Filled 2016-04-25 (×10): qty 1000

## 2016-04-25 MED ORDER — BUPIVACAINE-EPINEPHRINE (PF) 0.25% -1:200000 IJ SOLN
INTRAMUSCULAR | Status: AC
  Filled 2016-04-25: qty 30

## 2016-04-25 MED ORDER — GENTAMICIN SULFATE 40 MG/ML IJ SOLN
270.0000 mg | INTRAVENOUS | Status: DC
  Filled 2016-04-25: qty 6.75

## 2016-04-25 MED ORDER — HYDROMORPHONE HCL 1 MG/ML IJ SOLN
INTRAMUSCULAR | Status: AC
Start: 2016-04-25 — End: 2016-04-26
  Filled 2016-04-25: qty 1

## 2016-04-25 MED ORDER — SODIUM CHLORIDE 0.9 % IR SOLN
Status: DC | PRN
  Administered 2016-04-25 (×2): 1000 mL

## 2016-04-25 MED ORDER — GABAPENTIN 300 MG PO CAPS
300.0000 mg | ORAL_CAPSULE | ORAL | Status: AC
  Administered 2016-04-25: 300 mg via ORAL
  Filled 2016-04-25: qty 1

## 2016-04-25 MED ORDER — MIDAZOLAM HCL 2 MG/2ML IJ SOLN
INTRAMUSCULAR | Status: AC
  Filled 2016-04-25: qty 2

## 2016-04-25 MED ORDER — CELECOXIB 200 MG PO CAPS
400.0000 mg | ORAL_CAPSULE | ORAL | Status: AC
  Administered 2016-04-25: 400 mg via ORAL
  Filled 2016-04-25: qty 2

## 2016-04-25 MED ORDER — ONDANSETRON HCL 4 MG PO TABS: 4.0000 mg | ORAL_TABLET | Freq: Four times a day (QID) | ORAL | Status: DC | PRN

## 2016-04-25 MED ORDER — ACETAMINOPHEN 500 MG PO TABS
1000.0000 mg | ORAL_TABLET | Freq: Four times a day (QID) | ORAL | Status: AC
  Administered 2016-04-25 – 2016-04-26 (×3): 1000 mg via ORAL
  Filled 2016-04-25 (×3): qty 2

## 2016-04-25 MED ORDER — NALOXONE HCL 0.4 MG/ML IJ SOLN: 0.4000 mg | INTRAMUSCULAR | Status: DC | PRN

## 2016-04-25 MED ORDER — DIPHENHYDRAMINE HCL 50 MG/ML IJ SOLN: 12.5000 mg | Freq: Four times a day (QID) | INTRAMUSCULAR | Status: DC | PRN

## 2016-04-25 MED ORDER — ALUM & MAG HYDROXIDE-SIMETH 200-200-20 MG/5ML PO SUSP
30.0000 mL | Freq: Four times a day (QID) | ORAL | Status: DC | PRN
  Administered 2016-04-26: 30 mL via ORAL
  Filled 2016-04-25: qty 30

## 2016-04-25 MED ORDER — LIDOCAINE 2% (20 MG/ML) 5 ML SYRINGE
INTRAMUSCULAR | Status: DC | PRN
  Administered 2016-04-25: 80 mg via INTRAVENOUS

## 2016-04-25 MED ORDER — KETOROLAC TROMETHAMINE 15 MG/ML IJ SOLN
15.0000 mg | Freq: Four times a day (QID) | INTRAMUSCULAR | Status: AC
  Administered 2016-04-25 – 2016-04-26 (×4): 15 mg via INTRAVENOUS
  Filled 2016-04-25 (×3): qty 1

## 2016-04-25 MED ORDER — FLUTICASONE PROPIONATE 50 MCG/ACT NA SUSP
2.0000 | Freq: Every day | NASAL | Status: DC
  Administered 2016-04-25 – 2016-04-30 (×5): 2 via NASAL
  Filled 2016-04-25: qty 16

## 2016-04-25 MED ORDER — DEXAMETHASONE SODIUM PHOSPHATE 10 MG/ML IJ SOLN
INTRAMUSCULAR | Status: DC | PRN
  Administered 2016-04-25: 10 mg via INTRAVENOUS

## 2016-04-25 MED ORDER — ALVIMOPAN 12 MG PO CAPS
ORAL_CAPSULE | ORAL | Status: AC
  Administered 2016-04-25: 12 mg
  Filled 2016-04-25: qty 1

## 2016-04-25 MED ORDER — IBUPROFEN 600 MG PO TABS
600.0000 mg | ORAL_TABLET | Freq: Three times a day (TID) | ORAL | Status: DC | PRN
  Administered 2016-04-26 – 2016-04-28 (×3): 600 mg via ORAL
  Filled 2016-04-25 (×3): qty 1

## 2016-04-25 MED ORDER — ACETAMINOPHEN 500 MG PO TABS
1000.0000 mg | ORAL_TABLET | ORAL | Status: AC
  Administered 2016-04-25: 1000 mg via ORAL
  Filled 2016-04-25: qty 2

## 2016-04-25 MED ORDER — PROPOFOL 10 MG/ML IV BOLUS
INTRAVENOUS | Status: DC | PRN
  Administered 2016-04-25: 125 mg via INTRAVENOUS

## 2016-04-25 MED ORDER — FLUOXETINE HCL 20 MG PO CAPS
60.0000 mg | ORAL_CAPSULE | Freq: Every day | ORAL | Status: DC
  Administered 2016-04-25 – 2016-04-30 (×6): 60 mg via ORAL
  Filled 2016-04-25 (×7): qty 3

## 2016-04-25 MED ORDER — ELETRIPTAN HYDROBROMIDE 40 MG PO TABS: 40.0000 mg | ORAL_TABLET | ORAL | Status: DC | PRN

## 2016-04-25 MED ORDER — CLINDAMYCIN PHOSPHATE 900 MG/50ML IV SOLN
900.0000 mg | Freq: Three times a day (TID) | INTRAVENOUS | Status: AC
  Administered 2016-04-25: 900 mg via INTRAVENOUS
  Filled 2016-04-25: qty 50

## 2016-04-25 MED ORDER — MIDAZOLAM HCL 5 MG/5ML IJ SOLN
INTRAMUSCULAR | Status: DC | PRN
  Administered 2016-04-25: 2 mg via INTRAVENOUS

## 2016-04-25 MED ORDER — SODIUM CHLORIDE 0.9% FLUSH: 9.0000 mL | INTRAVENOUS | Status: DC | PRN

## 2016-04-25 MED ORDER — PHENYLEPHRINE HCL 10 MG/ML IJ SOLN
INTRAMUSCULAR | Status: DC | PRN
  Administered 2016-04-25: 120 ug via INTRAVENOUS
  Administered 2016-04-25: 80 ug via INTRAVENOUS

## 2016-04-25 MED ORDER — DIPHENHYDRAMINE HCL 12.5 MG/5ML PO ELIX: 12.5000 mg | ORAL_SOLUTION | Freq: Four times a day (QID) | ORAL | Status: DC | PRN

## 2016-04-25 MED ORDER — FENTANYL CITRATE (PF) 100 MCG/2ML IJ SOLN
INTRAMUSCULAR | Status: DC | PRN
  Administered 2016-04-25 (×3): 50 ug via INTRAVENOUS

## 2016-04-25 MED ORDER — HYDROMORPHONE HCL 1 MG/ML IJ SOLN
INTRAMUSCULAR | Status: AC
  Filled 2016-04-25: qty 1

## 2016-04-25 MED ORDER — FENTANYL CITRATE (PF) 100 MCG/2ML IJ SOLN
INTRAMUSCULAR | Status: AC
  Filled 2016-04-25: qty 4

## 2016-04-25 MED ORDER — PHENYLEPHRINE HCL 10 MG/ML IJ SOLN
INTRAVENOUS | Status: DC | PRN
  Administered 2016-04-25: 20 ug/min via INTRAVENOUS

## 2016-04-25 MED ORDER — ACETAMINOPHEN 325 MG PO TABS
650.0000 mg | ORAL_TABLET | Freq: Four times a day (QID) | ORAL | Status: DC | PRN
  Administered 2016-04-26 – 2016-04-28 (×4): 650 mg via ORAL
  Filled 2016-04-25 (×4): qty 2

## 2016-04-25 MED ORDER — LORATADINE 10 MG PO TABS
10.0000 mg | ORAL_TABLET | Freq: Every day | ORAL | Status: DC
  Administered 2016-04-27 – 2016-04-30 (×4): 10 mg via ORAL
  Filled 2016-04-25 (×6): qty 1

## 2016-04-25 MED ORDER — LIDOCAINE HCL 1 % IJ SOLN
INTRAMUSCULAR | Status: DC | PRN
  Administered 2016-04-25: 9 mL via INTRAMUSCULAR

## 2016-04-25 MED ORDER — TRAZODONE HCL 100 MG PO TABS
200.0000 mg | ORAL_TABLET | Freq: Every day | ORAL | Status: DC
  Administered 2016-04-25 – 2016-04-29 (×5): 200 mg via ORAL
  Filled 2016-04-25 (×5): qty 2

## 2016-04-25 MED ORDER — ONDANSETRON HCL 4 MG/2ML IJ SOLN
4.0000 mg | Freq: Four times a day (QID) | INTRAMUSCULAR | Status: DC | PRN
  Filled 2016-04-25: qty 2

## 2016-04-25 MED ORDER — HYDROMORPHONE HCL 1 MG/ML IJ SOLN
0.2500 mg | INTRAMUSCULAR | Status: DC | PRN
  Administered 2016-04-25 (×2): 0.25 mg via INTRAVENOUS
  Administered 2016-04-25: 0.5 mg via INTRAVENOUS

## 2016-04-25 MED ORDER — LIDOCAINE HCL (PF) 1 % IJ SOLN
INTRAMUSCULAR | Status: AC
Start: 2016-04-25 — End: 2016-04-25
  Filled 2016-04-25: qty 30

## 2016-04-25 MED ORDER — CLONAZEPAM 1 MG PO TABS
1.0000 mg | ORAL_TABLET | Freq: Every day | ORAL | Status: DC | PRN
Start: 2016-04-25 — End: 2016-04-30

## 2016-04-25 MED ORDER — KETOROLAC TROMETHAMINE 15 MG/ML IJ SOLN
15.0000 mg | Freq: Four times a day (QID) | INTRAMUSCULAR | Status: AC | PRN
  Administered 2016-04-26 – 2016-04-28 (×3): 15 mg via INTRAVENOUS
  Filled 2016-04-25 (×5): qty 1

## 2016-04-25 MED ORDER — PROPOFOL 10 MG/ML IV BOLUS
INTRAVENOUS | Status: AC
  Filled 2016-04-25: qty 40

## 2016-04-25 MED ORDER — CLINDAMYCIN PHOSPHATE 900 MG/50ML IV SOLN
900.0000 mg | INTRAVENOUS | Status: DC
  Filled 2016-04-25: qty 50

## 2016-04-25 MED ORDER — SUGAMMADEX SODIUM 200 MG/2ML IV SOLN
INTRAVENOUS | Status: DC | PRN
  Administered 2016-04-25: 100 mg via INTRAVENOUS

## 2016-04-25 MED ORDER — HYDROMORPHONE HCL 1 MG/ML IJ SOLN
0.5000 mg | INTRAMUSCULAR | Status: DC | PRN
  Administered 2016-04-25 – 2016-04-29 (×19): 1 mg via INTRAVENOUS
  Filled 2016-04-25 (×18): qty 1

## 2016-04-25 MED ORDER — ROCURONIUM BROMIDE 10 MG/ML (PF) SYRINGE
PREFILLED_SYRINGE | INTRAVENOUS | Status: DC | PRN
  Administered 2016-04-25: 30 mg via INTRAVENOUS
  Administered 2016-04-25: 40 mg via INTRAVENOUS

## 2016-04-25 MED ORDER — BOOST / RESOURCE BREEZE PO LIQD
1.0000 | Freq: Three times a day (TID) | ORAL | Status: DC
  Administered 2016-04-26 – 2016-04-30 (×5): 1 via ORAL

## 2016-04-25 MED ORDER — LACTATED RINGERS IV SOLN
INTRAVENOUS | Status: DC | PRN
  Administered 2016-04-25 (×2): via INTRAVENOUS

## 2016-04-25 MED ORDER — ENOXAPARIN SODIUM 40 MG/0.4ML ~~LOC~~ SOLN
40.0000 mg | SUBCUTANEOUS | Status: DC
  Administered 2016-04-26 – 2016-04-30 (×5): 40 mg via SUBCUTANEOUS
  Filled 2016-04-25 (×5): qty 0.4

## 2016-04-25 MED ORDER — PROMETHAZINE HCL 25 MG/ML IJ SOLN
INTRAMUSCULAR | Status: AC
  Filled 2016-04-25: qty 1

## 2016-04-25 MED ORDER — ALBUMIN HUMAN 5 % IV SOLN
INTRAVENOUS | Status: DC | PRN
  Administered 2016-04-25 (×2): via INTRAVENOUS

## 2016-04-25 SURGICAL SUPPLY — 85 items
ADH SKN CLS APL DERMABOND .7 (GAUZE/BANDAGES/DRESSINGS) ×1
APPLIER CLIP ROT 10 11.4 M/L (STAPLE)
APR CLP MED LRG 11.4X10 (STAPLE)
BLADE SURG ROTATE 9660 (MISCELLANEOUS) IMPLANT
CANISTER SUCTION 2500CC (MISCELLANEOUS) ×3 IMPLANT
CELLS DAT CNTRL 66122 CELL SVR (MISCELLANEOUS) IMPLANT
CHLORAPREP W/TINT 26ML (MISCELLANEOUS) ×3 IMPLANT
CLIP APPLIE ROT 10 11.4 M/L (STAPLE) IMPLANT
COVER MAYO STAND STRL (DRAPES) ×6 IMPLANT
COVER SURGICAL LIGHT HANDLE (MISCELLANEOUS) ×6 IMPLANT
DERMABOND ADVANCED (GAUZE/BANDAGES/DRESSINGS) ×2
DERMABOND ADVANCED .7 DNX12 (GAUZE/BANDAGES/DRESSINGS) ×1 IMPLANT
DRAPE PROXIMA HALF (DRAPES) ×3 IMPLANT
DRAPE UTILITY XL STRL (DRAPES) ×3 IMPLANT
DRAPE WARM FLUID 44X44 (DRAPE) ×3 IMPLANT
DRSG OPSITE POSTOP 4X10 (GAUZE/BANDAGES/DRESSINGS) IMPLANT
DRSG OPSITE POSTOP 4X8 (GAUZE/BANDAGES/DRESSINGS) ×3 IMPLANT
ELECT BLADE 6.5 EXT (BLADE) ×3 IMPLANT
ELECT CAUTERY BLADE 6.4 (BLADE) ×6 IMPLANT
ELECT REM PT RETURN 9FT ADLT (ELECTROSURGICAL) ×3
ELECTRODE REM PT RTRN 9FT ADLT (ELECTROSURGICAL) ×1 IMPLANT
GEL ULTRASOUND 20GR AQUASONIC (MISCELLANEOUS) IMPLANT
GLOVE BIO SURGEON STRL SZ 6 (GLOVE) ×6 IMPLANT
GLOVE BIOGEL PI IND STRL 6.5 (GLOVE) ×4 IMPLANT
GLOVE BIOGEL PI INDICATOR 6.5 (GLOVE) ×8
GLOVE INDICATOR 6.5 STRL GRN (GLOVE) ×6 IMPLANT
GLOVE SURG SS PI 6.0 STRL IVOR (GLOVE) ×6 IMPLANT
GOWN STRL REUS W/ TWL LRG LVL3 (GOWN DISPOSABLE) ×5 IMPLANT
GOWN STRL REUS W/TWL 2XL LVL3 (GOWN DISPOSABLE) ×6 IMPLANT
GOWN STRL REUS W/TWL LRG LVL3 (GOWN DISPOSABLE) ×15
KIT BASIN OR (CUSTOM PROCEDURE TRAY) ×3 IMPLANT
KIT ROOM TURNOVER OR (KITS) ×3 IMPLANT
L-HOOK LAP DISP 36CM (ELECTROSURGICAL) ×3
LEGGING LITHOTOMY PAIR STRL (DRAPES) ×3 IMPLANT
LHOOK LAP DISP 36CM (ELECTROSURGICAL) ×1 IMPLANT
LIGASURE 5MM LAPAROSCOPIC (INSTRUMENTS) IMPLANT
LIGASURE IMPACT 36 18CM CVD LR (INSTRUMENTS) IMPLANT
NS IRRIG 1000ML POUR BTL (IV SOLUTION) ×6 IMPLANT
PAD ARMBOARD 7.5X6 YLW CONV (MISCELLANEOUS) ×6 IMPLANT
PENCIL BUTTON HOLSTER BLD 10FT (ELECTRODE) ×6 IMPLANT
RELOAD PROXIMATE 75MM BLUE (ENDOMECHANICALS) ×3 IMPLANT
RTRCTR WOUND ALEXIS 18CM MED (MISCELLANEOUS)
SCISSORS LAP 5X35 DISP (ENDOMECHANICALS) ×3 IMPLANT
SET IRRIG TUBING LAPAROSCOPIC (IRRIGATION / IRRIGATOR) ×3 IMPLANT
SHEARS HARMONIC HDI 36CM (ELECTROSURGICAL) ×3 IMPLANT
SLEEVE ENDOPATH XCEL 5M (ENDOMECHANICALS) ×9 IMPLANT
SPECIMEN JAR LARGE (MISCELLANEOUS) ×3 IMPLANT
SPONGE LAP 18X18 X RAY DECT (DISPOSABLE) ×3 IMPLANT
STAPLER CIRC ILS CVD 33MM 37CM (STAPLE) ×3 IMPLANT
STAPLER PROXIMATE 75MM BLUE (STAPLE) ×3 IMPLANT
STAPLER VISISTAT 35W (STAPLE) ×3 IMPLANT
SUCTION POOLE TIP (SUCTIONS) ×3 IMPLANT
SURGILUBE 2OZ TUBE FLIPTOP (MISCELLANEOUS) ×3 IMPLANT
SUT MNCRL AB 4-0 PS2 18 (SUTURE) ×6 IMPLANT
SUT PDS AB 1 CT  36 (SUTURE)
SUT PDS AB 1 CT 36 (SUTURE) IMPLANT
SUT PDS AB 1 TP1 96 (SUTURE) ×6 IMPLANT
SUT PROLENE 0 SH 30 (SUTURE) ×3 IMPLANT
SUT PROLENE 2 0 CT2 30 (SUTURE) IMPLANT
SUT PROLENE 2 0 KS (SUTURE) IMPLANT
SUT VIC AB 0 CT1 27 (SUTURE) ×3
SUT VIC AB 0 CT1 27XBRD ANBCTR (SUTURE) ×1 IMPLANT
SUT VIC AB 2-0 SH 18 (SUTURE) ×3 IMPLANT
SUT VIC AB 3-0 SH 18 (SUTURE) ×3 IMPLANT
SUT VIC AB 3-0 SH 8-18 (SUTURE) ×3 IMPLANT
SUT VICRYL AB 2 0 TIES (SUTURE) ×3 IMPLANT
SUT VICRYL AB 3 0 TIES (SUTURE) ×3 IMPLANT
SYR BULB IRRIGATION 50ML (SYRINGE) ×3 IMPLANT
SYS LAPSCP GELPORT 120MM (MISCELLANEOUS) ×3
SYSTEM LAPSCP GELPORT 120MM (MISCELLANEOUS) ×1 IMPLANT
TOWEL OR 17X26 10 PK STRL BLUE (TOWEL DISPOSABLE) ×6 IMPLANT
TRAY FOLEY BAG SILVER LF 16FR (CATHETERS) ×3 IMPLANT
TRAY FOLEY CATH 16FRSI W/METER (SET/KITS/TRAYS/PACK) IMPLANT
TRAY LAPAROSCOPIC MC (CUSTOM PROCEDURE TRAY) ×3 IMPLANT
TRAY PROCTOSCOPIC FIBER OPTIC (SET/KITS/TRAYS/PACK) ×3 IMPLANT
TROCAR XCEL 12X100 BLDLESS (ENDOMECHANICALS) IMPLANT
TROCAR XCEL BLUNT TIP 100MML (ENDOMECHANICALS) IMPLANT
TROCAR XCEL NON-BLD 11X100MML (ENDOMECHANICALS) IMPLANT
TROCAR XCEL NON-BLD 5MMX100MML (ENDOMECHANICALS) ×3 IMPLANT
TUBE CONNECTING 12'X1/4 (SUCTIONS) ×2
TUBE CONNECTING 12X1/4 (SUCTIONS) ×4 IMPLANT
TUBING FILTER THERMOFLATOR (ELECTROSURGICAL) ×3 IMPLANT
TUBING INSUFFLATION (TUBING) IMPLANT
TUBING SUCTION BULK 100 FT (MISCELLANEOUS) ×3 IMPLANT
YANKAUER SUCT BULB TIP NO VENT (SUCTIONS) ×6 IMPLANT

## 2016-04-25 NOTE — Op Note (Signed)
Laparoscopic Sigmoid Colectomy Procedure Note  Indications: This patient presents for a laparoscopic sigmoid colectomy for recurrent diverticulitis refractory to medical management  Pre-operative Diagnosis: sigmoid diverticulitis  Post-operative Diagnosis: Same  Surgeon: Stark Klein   Assistants: Lorine Bears, MD  Anesthesia: General endotracheal anesthesia  ASA Class: 3  Procedure Details  The patient was seen in the Holding Room. The risks, benefits, complications, treatment options, and expected outcomes were discussed with the patient. The possibilities of reaction to medication, perforation of viscus, bleeding, recurrent infection, finding a normal colon, the need for additional procedures, failure to diagnose a condition, and creating a complication requiring transfusion or operation were discussed with the patient. The patient was advised of the risk of ostomy.  The patient concurred with the proposed plan, giving informed consent.   The patient was taken to the operating room, identified, and the procedure verified as laparoscopic sigmoid colectomy. The patient was brought to the operating room and placed supine. After induction of a general anesthetic, a Foley catheter was inserted.  The patient was placed in low lithotomy position and arms were tucked.  The abdomen was prepped and draped in standard fashion.   A Time Out was held and the above information confirmed.  The patient was placed into reverse trendelenburg position and rotated to the right .  A 5 mm optiview trocar was placed under direct visualization after administration of local anesthetic.  Pneumoperitoneum was insufflated to a pressure of 15 mm Hg. The laparoscope was introduced.    Exploration revealed the sigmoid to be adherent to the abdominal wall.  The small bowel, peritoneum, liver, and stomach appeared normal.  There was a small inferior accessory spleen.   Two right lateral 5-mm trocars were then placed after  anesthetizing the skin and peritoneum with local. The sigmoid was taken off the abdominal wall with scissors.  The descending colon was then mobilized with gentle retraction of the colon in a medial direction with mobilization of the peritoneal reflection with scissors. Mobilization of this area was complete to expose the retroperitoneum. The ureter was identified during this mobilization and avoided. There was minimal blood loss during this portion of the procedure.      A wound protector was placed in the lower abdomen in the midline.  The sigmoid was elevated out of the incision.  The 75 mm GIA stapler was used to divide the colon at the rectosigmoid junction.  The GIA was used to divide the distal descending colon/sigmoid junction as well at an area of non inflamed colon.  The proximal colon was opened.  The sizers were used to determine that a 33 mm EEA would be appropriate.  The anvil was secured in the proximal colon with a 0-0 prolene suture in pursestring fashion.      An end-to-end anastomosis was performed through the small anterior incision with the EEA stapling device. The vagina was checked multiple times to make sure the stapler was in the Anus.  the bowel was clamped and the rectum insufflated with the rigid proctoscope.  Hemostasis was confirmed. There was no evidence of bubbles or leak.    The peritoneum of the hand port was closed with running 0-0 vicryl. The fascia was closed with running #1 PDS suture.  The soft tissue was irrigated and the hand port was closed with 3-0 vicryl interrupted suture and 4-0 monocryl running subcuticular closure. Steri-Strips were applied.  Instrument, sponge, and needle counts were correct prior to abdominal closure and at the conclusion of  the case.   Findings: sigmoid colon adherent to abdominal wall in LLQ.  Omentum of transverse colon adherent to sigmoid.  No evidence of perforation.    Estimated Blood Loss: less than 50 mL             Specimens:  sigmoid colon with anastamotic rings to pathology            Complications: None; patient tolerated the procedure well.         Disposition: PACU - hemodynamically stable.         Condition: stable

## 2016-04-25 NOTE — Transfer of Care (Signed)
Immediate Anesthesia Transfer of Care Note  Patient: Dorothy White  Procedure(s) Performed: Procedure(s): LAPAROSCOPIC SIGMOID COLECTOMY (N/A)  Patient Location: PACU  Anesthesia Type:General  Level of Consciousness: awake, alert , oriented and patient cooperative  Airway & Oxygen Therapy: Patient Spontanous Breathing and Patient connected to nasal cannula oxygen  Post-op Assessment: Report given to RN, Post -op Vital signs reviewed and stable and Patient moving all extremities X 4  Post vital signs: Reviewed and stable  Last Vitals:  Vitals:   04/25/16 0700  BP: (!) 121/59  Pulse: 82  Resp: 18  Temp: 37.1 C    Last Pain:  Vitals:   04/25/16 0723  PainSc: 4          Complications: No apparent anesthesia complications

## 2016-04-25 NOTE — Interval H&P Note (Signed)
History and Physical Interval Note:  04/25/2016 7:19 AM  Dorothy White  has presented today for surgery, with the diagnosis of recurrent diverticulitis  The various methods of treatment have been discussed with the patient and family. After consideration of risks, benefits and other options for treatment, the patient has consented to  Procedure(s): LAPAROSCOPIC SIGMOID COLECTOMY (N/A) as a surgical intervention .  The patient's history has been reviewed, patient examined, no change in status, stable for surgery.  I have reviewed the patient's chart and labs.  Questions were answered to the patient's satisfaction.     Dorothy White

## 2016-04-25 NOTE — Anesthesia Preprocedure Evaluation (Addendum)
Anesthesia Evaluation  Patient identified by MRN, date of birth, ID band Patient awake    Reviewed: Allergy & Precautions, NPO status , Patient's Chart, lab work & pertinent test results  History of Anesthesia Complications Negative for: history of anesthetic complications  Airway Mallampati: II  TM Distance: >3 FB Neck ROM: Full    Dental  (+) Teeth Intact   Pulmonary shortness of breath, Current Smoker,    breath sounds clear to auscultation       Cardiovascular + CAD, + Past MI and + DOE  + dysrhythmias  Rhythm:Regular Rate:Normal     Neuro/Psych Anxiety Depression TIACVA    GI/Hepatic GERD  ,  Endo/Other    Renal/GU      Musculoskeletal  (+) Arthritis ,   Abdominal   Peds  Hematology  (+) anemia ,   Anesthesia Other Findings   Reproductive/Obstetrics                            Anesthesia Physical Anesthesia Plan  ASA: III  Anesthesia Plan: General   Post-op Pain Management:    Induction: Intravenous  Airway Management Planned: Oral ETT  Additional Equipment:   Intra-op Plan:   Post-operative Plan: Extubation in OR  Informed Consent: I have reviewed the patients History and Physical, chart, labs and discussed the procedure including the risks, benefits and alternatives for the proposed anesthesia with the patient or authorized representative who has indicated his/her understanding and acceptance.   Dental advisory given  Plan Discussed with: CRNA  Anesthesia Plan Comments:         Anesthesia Quick Evaluation

## 2016-04-25 NOTE — Care Management Note (Addendum)
Case Management Note  Patient Details  Name: Dorothy White MRN: [id number] Date of Birth: 1951/05/05  Subjective/Objective:                    Action/Plan:  Will await PT eval. Consult for home health needs. Will need orders for home health  Expected Discharge Date:                  Expected Discharge Plan:     In-House Referral:  Clinical Social Work  Discharge planning Services  CM Consult  Post Acute Care Choice:  Home Health Choice offered to:     DME Arranged:    DME Agency:     HH Arranged:    [place] Agency:     Status of Service:  In process, will continue to follow  If discussed at Long Length of Stay Meetings, dates discussed:    Additional Comments:  Marilu Favre, RN 04/25/2016, 1:34 PM

## 2016-04-25 NOTE — Anesthesia Procedure Notes (Signed)
Procedure Name: Intubation Date/Time: 04/25/2016 7:44 AM Performed by: Mervyn Gay Pre-anesthesia Checklist: Patient identified, Patient being monitored, Timeout performed, Emergency Drugs available and Suction available Patient Re-evaluated:Patient Re-evaluated prior to inductionOxygen Delivery Method: Circle System Utilized Preoxygenation: Pre-oxygenation with 100% oxygen Intubation Type: IV induction Ventilation: Mask ventilation without difficulty Laryngoscope Size: Miller and 3 Grade View: Grade I Tube type: Oral Tube size: 7.5 mm Number of attempts: 1 Airway Equipment and Method: Stylet Placement Confirmation: ETT inserted through vocal cords under direct vision,  positive ETCO2 and breath sounds checked- equal and bilateral Secured at: 21 cm Tube secured with: Tape Dental Injury: Teeth and Oropharynx as per pre-operative assessment

## 2016-04-26 LAB — CBC
HEMATOCRIT: 30 % — AB (ref 36.0–46.0)
Hemoglobin: 9.2 g/dL — ABNORMAL LOW (ref 12.0–15.0)
MCH: 27.4 pg (ref 26.0–34.0)
MCHC: 30.7 g/dL (ref 30.0–36.0)
MCV: 89.3 fL (ref 78.0–100.0)
Platelets: 204 10*3/uL (ref 150–400)
RBC: 3.36 MIL/uL — ABNORMAL LOW (ref 3.87–5.11)
RDW: 13.6 % (ref 11.5–15.5)
WBC: 7.5 10*3/uL (ref 4.0–10.5)

## 2016-04-26 LAB — BASIC METABOLIC PANEL
Anion gap: 4 — ABNORMAL LOW (ref 5–15)
BUN: 5 mg/dL — AB (ref 6–20)
CALCIUM: 8.4 mg/dL — AB (ref 8.9–10.3)
CO2: 27 mmol/L (ref 22–32)
CREATININE: 0.54 mg/dL (ref 0.44–1.00)
Chloride: 109 mmol/L (ref 101–111)
GFR calc Af Amer: >60 mL/min (ref >60)
GLUCOSE: 120 mg/dL — AB (ref 65–99)
POTASSIUM: 4.1 mmol/L (ref 3.5–5.1)
Sodium: 140 mmol/L (ref 135–145)

## 2016-04-26 MED ORDER — OXYCODONE HCL 5 MG PO TABS
5.0000 mg | ORAL_TABLET | Freq: Four times a day (QID) | ORAL | Status: DC | PRN
  Administered 2016-04-26 – 2016-04-28 (×7): 5 mg via ORAL
  Filled 2016-04-26 (×8): qty 1

## 2016-04-26 NOTE — Progress Notes (Signed)
Initial Nutrition Assessment  DOCUMENTATION CODES:   Not applicable  INTERVENTION:   -Continue Boost Breeze po TID, each supplement provides 250 kcal and 9 grams of protein -RD will follow for diet advancement and adjust supplement regimen as appropriate  NUTRITION DIAGNOSIS:   Increased nutrient needs related to  (post-op healing) as evidenced by estimated needs.  GOAL:   Patient will meet greater than or equal to 90% of their needs  MONITOR:   PO intake, Supplement acceptance, Diet advancement, Labs, Weight trends, Skin, I & O's  REASON FOR ASSESSMENT:   Malnutrition Screening Tool    ASSESSMENT:   The patient is a 65 year old female who presents with diverticulitis. Patient is a 65 year old female that I saw in the hospital for diverticulitis of the sigmoid colon with abscess. She was admitted 8/16 and discharged 8/23. She received a percutaneous drain while in house. Her abscess cavity collapsed and the drain was removed prior to discharge. She had Escherichia coli which was extended spectrum beta-lactamase positive. She was sent home on Invanz. She has been off of antibiotics for approximately a week. She has had no recurrent fevers or night sweats. She is tolerating her low residue diet without left lower quadrant pain or changes in bowel habits. She is not having chest pain.  Pt admitted with diverticulitis.   S/p Procedure(s) 04/25/16: LAPAROSCOPIC SIGMOID COLECTOMY (N/A)  Pt ambulating hallways with physical therapy at time of visit. Unable to complete nutrition-focused physical exam, however, pt did not appear to have signs of fat or muscle wasting. Pt appeared to be petite in stature.  Per chart review, pt was following a low residue diet at home.   Wt hx reviewed. Noted pt has experienced a 6.1% wt loss over the past 3 months.   Pt is currently on a clear liquid diet and tolerating well. Boost Breeze supplements have been ordered and pt is accepting well  per MAR. Given restrictions of clear liquid diet, wt loss PTA, and increased nutrient needs for post-op healing, RD will continue supplement.   Labs reviewed.   Diet Order:  Diet clear liquid Room service appropriate? Yes; Fluid consistency: Thin  Skin:  Reviewed, no issues  Last BM:  04/24/16  Height:   Ht Readings from Last 1 Encounters:  04/25/16 5\' 5"  (1.651 m)    Weight:   Wt Readings from Last 1 Encounters:  04/25/16 122 lb 12.7 oz (55.7 kg)    Ideal Body Weight:  56.8 kg  BMI:  Body mass index is 20.43 kg/m.  Estimated Nutritional Needs:   Kcal:  1600-1800  Protein:  75-90 grams  Fluid:  1.6-1.8 L  EDUCATION NEEDS:   No education needs identified at this time  Ellias Mcelreath A. Jimmye Norman, RD, LDN, CDE Pager: [phone] After hours Pager: [phone]

## 2016-04-26 NOTE — Evaluation (Signed)
Physical Therapy Evaluation Patient Details Name: Dorothy White MRN: [id number] DOB: 1950/10/16 Today's Date: 04/26/2016   History of Present Illness  Patient is a 65 yo female admitted 04/25/16 with diverticulitis of sigmoid colon with abscess.  Patient s/p laparoscopic sigmoid colectomy 04/25/16.   PMH:  migraines, anemia, MI, arrhythmia, anxiety, depression, back pain, recent admit in Aug for same with percutaneous drain.    Clinical Impression  Patient presents with problems listed below.  Will benefit from acute PT to maximize functional independence prior to discharge home.  Patient able to ambulate with RW 200' with min guard assist.  Feel patient should reach Mod I level of mobility/gait with RW to be able to return home with HHPT and [place].  Recommend HHPT for continued therapy for strengthening, mobility/gait training, and balance training.    Follow Up Recommendations Home health PT;Supervision - Intermittent ([place])    Equipment Recommendations  Rolling walker with 5" wheels;3in1 (PT)    Recommendations for Other Services       Precautions / Restrictions Precautions Precautions: Fall Restrictions Weight Bearing Restrictions: No      Mobility  Bed Mobility Overal bed mobility: Needs Assistance Bed Mobility: Supine to Sit;Sit to Supine     Supine to sit: Min guard;HOB elevated Sit to supine: Min guard   General bed mobility comments: Verbal cues for technique.  Assist for safety only.  Transfers Overall transfer level: Needs assistance Equipment used: Rolling walker (2 wheeled) Transfers: Sit to/from Stand Sit to Stand: Min guard         General transfer comment: Verbal cues for hand placement.  Assist to steady during transfer.    Ambulation/Gait Ambulation/Gait assistance: Min guard Ambulation Distance (Feet): 200 Feet Assistive device: Rolling walker (2 wheeled) Gait Pattern/deviations: Step-through pattern;Decreased step length -  right;Decreased step length - left;Decreased stride length;Shuffle;Trunk flexed Gait velocity: decreased Gait velocity interpretation: Below normal speed for age/gender General Gait Details: Verbal cues for safe use of RW.  Cues to stand upright, and to relax/drop shoulders.  Unsteady gait, improved with use of RW.  Stairs            Wheelchair Mobility    Modified Rankin (Stroke Patients Only)       Balance Overall balance assessment: Needs assistance         Standing balance support: Single extremity supported Standing balance-Leahy Scale: Poor                               Pertinent Vitals/Pain Pain Assessment: 0-10 Pain Score: 7  (6 after ambulation) Pain Location: abdomen Pain Descriptors / Indicators: Sore;Sharp Pain Intervention(s): Monitored during session;Repositioned    Home Living Family/patient expects to be discharged to:: Private residence Living Arrangements: Alone;Non-relatives/Friends Available Help at Discharge: Friend(s);Available PRN/intermittently Type of Home: House Home Access: Level entry     Home Layout: Multi-level;Able to live on main level with bedroom/bathroom (Patient lives on 1st floor and friend lives on 2nd floor) Home Equipment: Kasandra Knudsen - single point      Prior Function Level of Independence: Independent               Hand Dominance   Dominant Hand: Right    Extremity/Trunk Assessment   Upper Extremity Assessment: Generalized weakness           Lower Extremity Assessment: Generalized weakness         Communication   Communication: No difficulties  Cognition Arousal/Alertness: Awake/alert Behavior During Therapy: WFL for tasks assessed/performed Overall Cognitive Status: Within Functional Limits for tasks assessed                      General Comments      Exercises     Assessment/Plan    PT Assessment Patient needs continued PT services  PT Problem List Decreased  strength;Decreased activity tolerance;Decreased balance;Decreased mobility;Decreased knowledge of use of DME;Cardiopulmonary status limiting activity;Pain          PT Treatment Interventions DME instruction;Gait training;Functional mobility training;Therapeutic activities;Therapeutic exercise;Balance training;Patient/family education    PT Goals (Current goals can be found in the Care Plan section)  Acute Rehab PT Goals Patient Stated Goal: To return home PT Goal Formulation: With patient Time For Goal Achievement: 05/03/16 Potential to Achieve Goals: Good    Frequency Min 3X/week   Barriers to discharge Decreased caregiver support Friend will be out of town for several days.  Typically he is available prn.    Co-evaluation               End of Session Equipment Utilized During Treatment: Gait belt Activity Tolerance: Patient limited by fatigue;Patient limited by pain Patient left: in bed;with call bell/phone within reach;with SCD's reapplied Nurse Communication: Mobility status (Encouraged ambulation in hallway with assist.)         Time: [id number] PT Time Calculation (min) (ACUTE ONLY): 25 min   Charges:   PT Evaluation $PT Eval Moderate Complexity: 1 Procedure PT Treatments $Gait Training: 8-22 mins   PT G CodesDespina White 05/19/16, 1:43 PM Dorothy White, [place] Pager [phone]

## 2016-04-26 NOTE — Progress Notes (Signed)
Patient ID: Dorothy White, female   DOB: December 30, 1950, 65 y.o.   MRN: [id number]  [facility] Surgery Progress Note  1 Day Post-Op  Subjective: BM this AM, +flatus. States that she is having some increased pain and nausea at this time, she is due for pain medication. Tolerating clears.  Objective: Vital signs in last 24 hours: Temp:  [97.3 F (36.3 C)-98.9 F (37.2 C)] 98.1 F (36.7 C) (09/30 0603) Pulse Rate:  [70-84] 72 (09/30 0603) Resp:  [11-16] 16 (09/30 0603) BP: (104-131)/(48-66) 109/53 (09/30 0603) SpO2:  [96 %-100 %] 99 % (09/30 0603) Weight:  [122 lb 12.7 oz (55.7 kg)] 122 lb 12.7 oz (55.7 kg) (09/29 2216) Last BM Date: 04/24/16  Intake/Output from previous day: 09/29 0701 - 09/30 0700 In: 2800 [I.V.:2300; IV Piggyback:500] Out: 1800 [Urine:1700; Blood:100] Intake/Output this shift: No intake/output data recorded.  PE: Gen:  Alert, NAD, pleasant Pulm:  CTA, no W/R/R Abd: Soft, ND, +BS, appropriately tender, incisions C/D/I with honeycomb dressing in place  Lab Results:   Recent Labs  04/25/16 1323 04/26/16 0410  WBC 8.6 7.5  HGB 10.4* 9.2*  HCT 33.0* 30.0*  PLT 215 204   BMET  Recent Labs  04/23/16 1013 04/25/16 1323 04/26/16 0410  NA 141  --  140  K 4.1  --  4.1  CL 106  --  109  CO2 24  --  27  GLUCOSE 94  --  120*  BUN 10  --  5*  CREATININE 0.78 0.61 0.54  CALCIUM 9.8  --  8.4*   PT/INR  Recent Labs  04/23/16 1013  LABPROT 12.7  INR 0.96   CMP     Component Value Date/Time   NA 140 04/26/2016 0410   K 4.1 04/26/2016 0410   CL 109 04/26/2016 0410   CO2 27 04/26/2016 0410   GLUCOSE 120 (H) 04/26/2016 0410   BUN 5 (L) 04/26/2016 0410   CREATININE 0.54 04/26/2016 0410   CREATININE 0.95 02/15/2016 1536   CALCIUM 8.4 (L) 04/26/2016 0410   CALCIUM 9.4 01/13/2007 2356   PROT 5.2 (L) 03/17/2016 1105   ALBUMIN 2.4 (L) 03/17/2016 1105   AST 24 03/17/2016 1105   ALT 12 (L) 03/17/2016 1105   ALKPHOS 60 03/17/2016 1105   BILITOT 0.4 03/17/2016 1105   GFRNONAA >60 04/26/2016 0410   GFRAA >60 04/26/2016 0410   Lipase     Component Value Date/Time   LIPASE 16 03/12/2016 1321       Studies/Results: No results found.  Anti-infectives: Anti-infectives    Start     Dose/Rate Route Frequency Ordered Stop   04/25/16 1400  clindamycin (CLEOCIN) IVPB 900 mg     900 mg 100 mL/hr over 30 Minutes Intravenous Every 8 hours 04/25/16 1311 04/25/16 1437   04/25/16 0745  gentamicin (GARAMYCIN) 270 mg in dextrose 5 % 100 mL IVPB  Status:  Discontinued     270 mg 106.8 mL/hr over 60 Minutes Intravenous Every 24 hours 04/25/16 0734 04/25/16 1309   04/25/16 0745  clindamycin (CLEOCIN) IVPB 900 mg  Status:  Discontinued     900 mg 100 mL/hr over 30 Minutes Intravenous To Surgery 04/25/16 0734 04/25/16 1309       Assessment/Plan S/p laparoscopic sigmoid colectomy 04/25/16 Dr. Barry Dienes - Admission 8/16-8/23/16 for sigmoid diverticulitis with pericolonic abscess, s/p percutaneous drain - POD1 - BM and flatus this Am. Some persistent nausea and abdominal pain.   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 Will keep on clears today. Mobilize.     LOS: 1 day    [name] , [facility] Surgery 04/26/2016, 8:08 AM Pager: [phone] Consults: [phone] Mon-Fri 7:00 am-4:30 pm Sat-Sun 7:00 am-11:30 am

## 2016-04-27 ENCOUNTER — Encounter (HOSPITAL_COMMUNITY): Payer: Self-pay | Admitting: General Surgery

## 2016-04-27 LAB — CBC
HEMATOCRIT: 30.7 % — AB (ref 36.0–46.0)
HEMOGLOBIN: 9.4 g/dL — AB (ref 12.0–15.0)
MCH: 27.8 pg (ref 26.0–34.0)
MCHC: 30.6 g/dL (ref 30.0–36.0)
MCV: 90.8 fL (ref 78.0–100.0)
Platelets: 196 10*3/uL (ref 150–400)
RBC: 3.38 MIL/uL — ABNORMAL LOW (ref 3.87–5.11)
RDW: 13.9 % (ref 11.5–15.5)
WBC: 5.7 10*3/uL (ref 4.0–10.5)

## 2016-04-27 LAB — BASIC METABOLIC PANEL
ANION GAP: 5 (ref 5–15)
CO2: 27 mmol/L (ref 22–32)
Calcium: 8.6 mg/dL — ABNORMAL LOW (ref 8.9–10.3)
Chloride: 110 mmol/L (ref 101–111)
Creatinine, Ser: 0.46 mg/dL (ref 0.44–1.00)
GFR calc Af Amer: >60 mL/min (ref >60)
GFR calc non Af Amer: >60 mL/min (ref >60)
GLUCOSE: 98 mg/dL (ref 65–99)
POTASSIUM: 3.9 mmol/L (ref 3.5–5.1)
Sodium: 142 mmol/L (ref 135–145)

## 2016-04-27 NOTE — Progress Notes (Signed)
Patient ID: Dorothy White, female   DOB: 06/27/1951, 65 y.o.   MRN: [id number]  [facility] Surgery Progress Note  2 Days Post-Op  Subjective: Had a lot of diarrhea last night, otherwise tolerating clear liquids. Denies n/v. Pain is controlled but she has not been able to decrease pain medication postoperatively yet.  Objective: Vital signs in last 24 hours: Temp:  [98.2 F (36.8 C)-98.4 F (36.9 C)] 98.4 F (36.9 C) (10/01 0444) Pulse Rate:  [74-84] 74 (10/01 0444) Resp:  [16-18] 18 (10/01 0444) BP: (106-142)/(44-66) 131/66 (10/01 0444) SpO2:  [96 %-98 %] 98 % (10/01 0444) Last BM Date: 04/26/16  Intake/Output from previous day: 09/30 0701 - 10/01 0700 In: 3088.3 [P.O.:800; I.V.:2288.3] Out: 575 [Urine:575] Intake/Output this shift: No intake/output data recorded.  PE: Gen:  Alert, NAD, pleasant Pulm:  CTA, no W/R/R Abd: Soft, ND, +BS, appropriately tender, incisions C/D/I with honeycomb dressing in place  Lab Results:   Recent Labs  04/26/16 0410 04/27/16 0734  WBC 7.5 5.7  HGB 9.2* 9.4*  HCT 30.0* 30.7*  PLT 204 196   BMET  Recent Labs  04/26/16 0410 04/27/16 0734  NA 140 142  K 4.1 3.9  CL 109 110  CO2 27 27  GLUCOSE 120* 98  BUN 5* <5*  CREATININE 0.54 0.46  CALCIUM 8.4* 8.6*   PT/INR No results for input(s): LABPROT, INR in the last 72 hours. CMP     Component Value Date/Time   NA 142 04/27/2016 0734   K 3.9 04/27/2016 0734   CL 110 04/27/2016 0734   CO2 27 04/27/2016 0734   GLUCOSE 98 04/27/2016 0734   BUN <5 (L) 04/27/2016 0734   CREATININE 0.46 04/27/2016 0734   CREATININE 0.95 02/15/2016 1536   CALCIUM 8.6 (L) 04/27/2016 0734   CALCIUM 9.4 01/13/2007 2356   PROT 5.2 (L) 03/17/2016 1105   ALBUMIN 2.4 (L) 03/17/2016 1105   AST 24 03/17/2016 1105   ALT 12 (L) 03/17/2016 1105   ALKPHOS 60 03/17/2016 1105   BILITOT 0.4 03/17/2016 1105   GFRNONAA >60 04/27/2016 0734   GFRAA >60 04/27/2016 0734   Lipase     Component  Value Date/Time   LIPASE 16 03/12/2016 1321       Studies/Results: No results found.  Anti-infectives: Anti-infectives    Start     Dose/Rate Route Frequency Ordered Stop   04/25/16 1400  clindamycin (CLEOCIN) IVPB 900 mg     900 mg 100 mL/hr over 30 Minutes Intravenous Every 8 hours 04/25/16 1311 04/25/16 1437   04/25/16 0745  gentamicin (GARAMYCIN) 270 mg in dextrose 5 % 100 mL IVPB  Status:  Discontinued     270 mg 106.8 mL/hr over 60 Minutes Intravenous Every 24 hours 04/25/16 0734 04/25/16 1309   04/25/16 0745  clindamycin (CLEOCIN) IVPB 900 mg  Status:  Discontinued     900 mg 100 mL/hr over 30 Minutes Intravenous To Surgery 04/25/16 0734 04/25/16 1309       Assessment/Plan S/p laparoscopic sigmoid colectomy 04/25/16 Dr. Barry Dienes - Admission 8/16-8/23/16 for sigmoid diverticulitis with pericolonic abscess, s/p percutaneous drain - POD2 - diarrhea last night, +flatus.  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 Mobilize.    LOS: 2 days    BROOKE A  [place] , [facility] Surgery 04/27/2016, 10:49 AM Pager: [phone] Consults: [phone] Mon-Fri 7:00 am-4:30 pm Sat-Sun 7:00 am-11:30 am

## 2016-04-28 LAB — BASIC METABOLIC PANEL
ANION GAP: 4 — AB (ref 5–15)
BUN: <5 mg/dL — ABNORMAL LOW (ref 6–20)
CHLORIDE: 105 mmol/L (ref 101–111)
CO2: 31 mmol/L (ref 22–32)
Calcium: 9.1 mg/dL (ref 8.9–10.3)
Creatinine, Ser: 0.5 mg/dL (ref 0.44–1.00)
GFR calc non Af Amer: >60 mL/min (ref >60)
GLUCOSE: 102 mg/dL — AB (ref 65–99)
Potassium: 4 mmol/L (ref 3.5–5.1)
Sodium: 140 mmol/L (ref 135–145)

## 2016-04-28 LAB — CBC
HEMATOCRIT: 36 % (ref 36.0–46.0)
HEMOGLOBIN: 10.9 g/dL — AB (ref 12.0–15.0)
MCH: 27.3 pg (ref 26.0–34.0)
MCHC: 30.3 g/dL (ref 30.0–36.0)
MCV: 90 fL (ref 78.0–100.0)
PLATELETS: 271 10*3/uL (ref 150–400)
RBC: 4 MIL/uL (ref 3.87–5.11)
RDW: 13.8 % (ref 11.5–15.5)
WBC: 5.2 10*3/uL (ref 4.0–10.5)

## 2016-04-28 MED ORDER — OXYCODONE-ACETAMINOPHEN 5-325 MG PO TABS
1.0000 | ORAL_TABLET | ORAL | Status: DC | PRN
  Administered 2016-04-28 – 2016-04-30 (×9): 2 via ORAL
  Filled 2016-04-28 (×9): qty 2

## 2016-04-28 NOTE — Progress Notes (Signed)
Physical Therapy Treatment Patient Details Name: Dorothy White MRN: [id number] DOB: 11-26-1950 Today's Date: 04/28/2016    History of Present Illness Patient is a 65 yo female admitted 04/25/16 with diverticulitis of sigmoid colon with abscess.  Patient s/p laparoscopic sigmoid colectomy 04/25/16.   PMH:  migraines, anemia, MI, arrhythmia, anxiety, depression, back pain, recent admit in Aug for same with percutaneous drain.    PT Comments    Pt making steady progress with RW and improving with transfers.  Encouraged ambulation with nursing.  Follow Up Recommendations  Home health PT;Supervision - Intermittent     Equipment Recommendations  Rolling walker with 5" wheels;3in1 (PT)    Recommendations for Other Services       Precautions / Restrictions Restrictions Weight Bearing Restrictions: No    Mobility  Bed Mobility   Bed Mobility: Rolling;Sidelying to Sit Rolling: Modified independent (Device/Increase time) Sidelying to sit: Modified independent (Device/Increase time)       General bed mobility comments: MOD I with bed mobility with heavy use of bed rail  Transfers Overall transfer level: Needs assistance   Transfers: Sit to/from Stand Sit to Stand: Supervision         General transfer comment: S for safety  Ambulation/Gait Ambulation/Gait assistance: Min guard;Supervision Ambulation Distance (Feet): 280 Feet Assistive device: Rolling walker (2 wheeled) Gait Pattern/deviations: Step-through pattern Gait velocity: decreased   General Gait Details: Steady with RW and upright posture.  At end of gait, cues to keep RW with her.   Stairs            Wheelchair Mobility    Modified Rankin (Stroke Patients Only)       Balance             Standing balance-Leahy Scale: Fair                      Cognition Arousal/Alertness: Awake/alert Behavior During Therapy: WFL for tasks assessed/performed Overall Cognitive Status: Within  Functional Limits for tasks assessed                      Exercises General Exercises - Lower Extremity Ankle Circles/Pumps: 10 reps;Seated Long Arc Quad: 5 reps;Seated Hip ABduction/ADduction: 5 reps;Seated    General Comments        Pertinent Vitals/Pain Pain Assessment: Faces Faces Pain Scale: Hurts little more Pain Location: ABD Pain Descriptors / Indicators: Grimacing Pain Intervention(s): Limited activity within patient's tolerance    Home Living                      Prior Function            PT Goals (current goals can now be found in the care plan section) Acute Rehab PT Goals PT Goal Formulation: With patient Time For Goal Achievement: 05/03/16 Potential to Achieve Goals: Good Progress towards PT goals: Progressing toward goals    Frequency    Min 3X/week      PT Plan Current plan remains appropriate    Co-evaluation             End of Session   Activity Tolerance: Patient limited by fatigue Patient left: in chair;with call bell/phone within reach     Time: 1053-1109 PT Time Calculation (min) (ACUTE ONLY): 16 min  Charges:  $Gait Training: 8-22 mins                    G Codes:  Dorothy White 04/28/2016, 11:22 AM

## 2016-04-28 NOTE — Progress Notes (Signed)
Patient ID: Dorothy White, female   DOB: 04/07/1951, 65 y.o.   MRN: [id number]  [facility] Surgery Progress Note  3 Days Post-Op  Subjective: Tolerating full liquids. Had multiple loose BM's early yesterday, but by the evening her BM was more normal. Passing flatus. Walked twice yesterday. States that she continues to have pain the PO pain medication does not help, but improving each day.  Objective: Vital signs in last 24 hours: Temp:  [98.2 F (36.8 C)-98.6 F (37 C)] 98.6 F (37 C) (10/02 0500) Pulse Rate:  [73-75] 73 (10/02 0500) Resp:  [18-19] 19 (10/02 0500) BP: (114-134)/(55-61) 131/59 (10/02 0500) SpO2:  [95 %-98 %] 98 % (10/02 0500) Last BM Date: 04/27/16  Intake/Output from previous day: 10/01 0701 - 10/02 0700 In: 1250 [P.O.:400; I.V.:850] Out: -  Intake/Output this shift: No intake/output data recorded.  PE: Gen: Alert, NAD, pleasant Pulm: CTA, no W/R/R Abd: Soft, ND, +BS, appropriately tender, incisions C/D/I with honeycomb dressing in place  Lab Results:   Recent Labs  04/27/16 0734 04/28/16 0531  WBC 5.7 5.2  HGB 9.4* 10.9*  HCT 30.7* 36.0  PLT 196 271   BMET  Recent Labs  04/27/16 0734 04/28/16 0531  NA 142 140  K 3.9 4.0  CL 110 105  CO2 27 31  GLUCOSE 98 102*  BUN <5* <5*  CREATININE 0.46 0.50  CALCIUM 8.6* 9.1   PT/INR No results for input(s): LABPROT, INR in the last 72 hours. CMP     Component Value Date/Time   NA 140 04/28/2016 0531   K 4.0 04/28/2016 0531   CL 105 04/28/2016 0531   CO2 31 04/28/2016 0531   GLUCOSE 102 (H) 04/28/2016 0531   BUN <5 (L) 04/28/2016 0531   CREATININE 0.50 04/28/2016 0531   CREATININE 0.95 02/15/2016 1536   CALCIUM 9.1 04/28/2016 0531   CALCIUM 9.4 01/13/2007 2356   PROT 5.2 (L) 03/17/2016 1105   ALBUMIN 2.4 (L) 03/17/2016 1105   AST 24 03/17/2016 1105   ALT 12 (L) 03/17/2016 1105   ALKPHOS 60 03/17/2016 1105   BILITOT 0.4 03/17/2016 1105   GFRNONAA >60 04/28/2016 0531   GFRAA >60 04/28/2016 0531   Lipase     Component Value Date/Time   LIPASE 16 03/12/2016 1321       Studies/Results: No results found.  Anti-infectives: Anti-infectives    Start     Dose/Rate Route Frequency Ordered Stop   04/25/16 1400  clindamycin (CLEOCIN) IVPB 900 mg     900 mg 100 mL/hr over 30 Minutes Intravenous Every 8 hours 04/25/16 1311 04/25/16 1437   04/25/16 0745  gentamicin (GARAMYCIN) 270 mg in dextrose 5 % 100 mL IVPB  Status:  Discontinued     270 mg 106.8 mL/hr over 60 Minutes Intravenous Every 24 hours 04/25/16 0734 04/25/16 1309   04/25/16 0745  clindamycin (CLEOCIN) IVPB 900 mg  Status:  Discontinued     900 mg 100 mL/hr over 30 Minutes Intravenous To Surgery 04/25/16 0734 04/25/16 1309       Assessment/Plan S/p laparoscopic sigmoid colectomy9/29/17 Dr. Barry Dienes - Admission 8/16-8/23/16 for sigmoid diverticulitis with pericolonic abscess, s/p percutaneous drain - POD3 - diarrhea is decreasing and BM's are becoming more normal. 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 Mobilize. Work towards decreasing IV pain medication.    LOS: 3 days    Dorothy White , [facility] 04/28/2016, 7:35 AM Pager: [phone] Consults: [phone] Mon-Fri 7:00 am-4:30 pm Sat-Sun 7:00 am-11:30 am

## 2016-04-29 LAB — CBC
HCT: 33.2 % — ABNORMAL LOW (ref 36.0–46.0)
HEMOGLOBIN: 10.1 g/dL — AB (ref 12.0–15.0)
MCH: 27.2 pg (ref 26.0–34.0)
MCHC: 30.4 g/dL (ref 30.0–36.0)
MCV: 89.5 fL (ref 78.0–100.0)
PLATELETS: 233 10*3/uL (ref 150–400)
RBC: 3.71 MIL/uL — AB (ref 3.87–5.11)
RDW: 13.6 % (ref 11.5–15.5)
WBC: 4.8 10*3/uL (ref 4.0–10.5)

## 2016-04-29 LAB — BASIC METABOLIC PANEL
ANION GAP: 5 (ref 5–15)
BUN: <5 mg/dL — ABNORMAL LOW (ref 6–20)
CALCIUM: 8.9 mg/dL (ref 8.9–10.3)
CO2: 30 mmol/L (ref 22–32)
Chloride: 106 mmol/L (ref 101–111)
Creatinine, Ser: 0.5 mg/dL (ref 0.44–1.00)
Glucose, Bld: 104 mg/dL — ABNORMAL HIGH (ref 65–99)
Potassium: 4.1 mmol/L (ref 3.5–5.1)
SODIUM: 141 mmol/L (ref 135–145)

## 2016-04-29 MED ORDER — HYDROMORPHONE HCL 1 MG/ML IJ SOLN: 0.5000 mg | Freq: Four times a day (QID) | INTRAMUSCULAR | Status: DC | PRN

## 2016-04-29 NOTE — Care Management Note (Signed)
Case Management Note  Patient Details  Name: Dorothy White MRN: [id number] Date of Birth: Dec 15, 1950  Subjective/Objective:                    Action/Plan:  Confirmed face sheet information with patient . Patient already has bedside commode at home, but needs a walker same ordered. Expected Discharge Date:                  Expected Discharge Plan:  [place]  In-House Referral:  Clinical Social Work  Discharge planning Services  CM Consult  Post Acute Care Choice:  Home Health Choice offered to:  Patient  DME Arranged:  Walker rolling DME Agency:  [place]:  PT Div[place]:  [place]  Status of Service:  Completed, signed off  If discussed at [place] of Stay Meetings, dates discussed:    Additional Comments:  Marilu Favre, RN 04/29/2016, 2:15 PM

## 2016-04-29 NOTE — Progress Notes (Signed)
Patient ID: Dorothy White, female   DOB: [birth year], 65 y.o.   MRN: [id number]  [facility] Surgery Progress Note  4 Days Post-Op  Subjective: Abdominal pain less today than yesterday. Tolerating soft diet. No BM yesterday. Passing flatus. Still using some IV pain medication but less.  Objective: Vital signs in last 24 hours: Temp:  [97.9 F (36.6 C)-98.2 F (36.8 C)] 97.9 F (36.6 C) (10/03 0623) Pulse Rate:  [63-71] 63 (10/03 0623) Resp:  [16-18] 16 (10/03 0623) BP: (110-133)/(51-61) 110/54 (10/03 0623) SpO2:  [97 %-98 %] 98 % (10/03 0623) Last BM Date: 04/27/16  Intake/Output from previous day: 10/02 0701 - 10/03 0700 In: 1620 [P.O.:120; I.V.:1500] Out: -  Intake/Output this shift: No intake/output data recorded.  PE: Gen: Alert, NAD, pleasant Pulm: CTAB, no W/R/R Abd: Soft, ND, +BS, appropriately tender, midline and lap incisions C/D/I  Lab Results:   Recent Labs  04/28/16 0531 04/29/16 0500  WBC 5.2 4.8  HGB 10.9* 10.1*  HCT 36.0 33.2*  PLT 271 233   BMET  Recent Labs  04/28/16 0531 04/29/16 0500  NA 140 141  K 4.0 4.1  CL 105 106  CO2 31 30  GLUCOSE 102* 104*  BUN <5* <5*  CREATININE 0.50 0.50  CALCIUM 9.1 8.9   PT/INR No results for input(s): LABPROT, INR in the last 72 hours. CMP     Component Value Date/Time   NA 141 04/29/2016 0500   K 4.1 04/29/2016 0500   CL 106 04/29/2016 0500   CO2 30 04/29/2016 0500   GLUCOSE 104 (H) 04/29/2016 0500   BUN <5 (L) 04/29/2016 0500   CREATININE 0.50 04/29/2016 0500   CREATININE 0.95 02/15/2016 1536   CALCIUM 8.9 04/29/2016 0500   CALCIUM 9.4 01/13/2007 2356   PROT 5.2 (L) 03/17/2016 1105   ALBUMIN 2.4 (L) 03/17/2016 1105   AST 24 03/17/2016 1105   ALT 12 (L) 03/17/2016 1105   ALKPHOS 60 03/17/2016 1105   BILITOT 0.4 03/17/2016 1105   GFRNONAA >60 04/29/2016 0500   GFRAA >60 04/29/2016 0500   Lipase     Component Value Date/Time   LIPASE 16 03/12/2016 1321        Studies/Results: No results found.  Anti-infectives: Anti-infectives    Start     Dose/Rate Route Frequency Ordered Stop   04/25/16 1400  clindamycin (CLEOCIN) IVPB 900 mg     900 mg 100 mL/hr over 30 Minutes Intravenous Every 8 hours 04/25/16 1311 04/25/16 1437   04/25/16 0745  gentamicin (GARAMYCIN) 270 mg in dextrose 5 % 100 mL IVPB  Status:  Discontinued     270 mg 106.8 mL/hr over 60 Minutes Intravenous Every 24 hours 04/25/16 0734 04/25/16 1309   04/25/16 0745  clindamycin (CLEOCIN) IVPB 900 mg  Status:  Discontinued     900 mg 100 mL/hr over 30 Minutes Intravenous To Surgery 04/25/16 0734 04/25/16 1309       Assessment/Plan S/p laparoscopic sigmoid colectomy9/29/17 Dr. 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 continue to work on pain control. Continue soft diet and mobilize. Likely will be ready for discharge tomorrow.   LOS:  4 days    [name] , [facility] Surgery 04/29/2016, 8:15 AM Pager: [phone] Consults: [phone] Mon-Fri 7:00 am-4:30 pm Sat-Sun 7:00 am-11:30 am

## 2016-04-29 NOTE — Progress Notes (Signed)
Encouraged to ambulate.

## 2016-04-29 NOTE — Progress Notes (Signed)
Physical Therapy Treatment Patient Details Name: Dorothy White MRN: [id number] DOB: May 13, 1951 Today's Date: 04/29/2016    History of Present Illness Patient is a 65 yo female admitted 04/25/16 with diverticulitis of sigmoid colon with abscess.  Patient s/p laparoscopic sigmoid colectomy 04/25/16.   PMH:  migraines, anemia, MI, arrhythmia, anxiety, depression, back pain, recent admit in Aug for same with percutaneous drain.    PT Comments    Pt performed increased gait and reviewed stairs for d/c. Next session to focus on high level balance activities.  Pt reports she having pain so standing activities post gait deferred.  RN informed of request for pain meds.    Follow Up Recommendations  Home health PT;Supervision - Intermittent     Equipment Recommendations       Recommendations for Other Services       Precautions / Restrictions Precautions Precautions: Fall Restrictions Weight Bearing Restrictions: No    Mobility  Bed Mobility Overal bed mobility: Modified Independent       Supine to sit: Modified independent (Device/Increase time) Sit to supine: Modified independent (Device/Increase time)   General bed mobility comments: No assist needed.    Transfers Overall transfer level: Modified independent Equipment used: None Transfers: Sit to/from Stand Sit to Stand: Modified independent (Device/Increase time)         General transfer comment: Good technique.    Ambulation/Gait Ambulation/Gait assistance: Supervision Ambulation Distance (Feet): 650 Feet Assistive device: None Gait Pattern/deviations: Step-through pattern;Trunk flexed;Antalgic Gait velocity: decreased Gait velocity interpretation: Below normal speed for age/gender General Gait Details: Cues for reciprocal armswing.  pt remains to fatigue and require rest breaks due to pain.    Stairs Stairs: Yes Stairs assistance: Supervision Stair Management: Alternating pattern;One rail Left Number of  Stairs: 4 General stair comments: Cues for sequencing and safety.    Wheelchair Mobility    Modified Rankin (Stroke Patients Only)       Balance             Standing balance-Leahy Scale: Fair                      Cognition Arousal/Alertness: Awake/alert Behavior During Therapy: WFL for tasks assessed/performed Overall Cognitive Status: Within Functional Limits for tasks assessed                      Exercises      General Comments        Pertinent Vitals/Pain Pain Assessment: 0-10 Pain Score: 6  Pain Location: abdominal incision Pain Descriptors / Indicators: Discomfort;Guarding Pain Intervention(s): Monitored during session;Repositioned;Patient requesting pain meds-RN notified    Home Living                      Prior Function            PT Goals (current goals can now be found in the care plan section) Acute Rehab PT Goals Patient Stated Goal: To return home Potential to Achieve Goals: Good Progress towards PT goals: Progressing toward goals    Frequency    Min 3X/week      PT Plan Current plan remains appropriate    Co-evaluation             End of Session   Activity Tolerance: Patient limited by fatigue Patient left: in chair;with call bell/phone within reach     Time: [id number] PT Time Calculation (min) (ACUTE ONLY): 15 min  Charges:  $Gait Training: 8-22  mins                    G Codes:      Cristela Blue 2016/05/02, 5:00 PM  Governor Rooks, PTA pager [phone]

## 2016-04-29 NOTE — Discharge Instructions (Signed)
[facility]      [facility] [phone]  OPEN ABDOMINAL SURGERY: POST OP INSTRUCTIONS  Always review your discharge instruction sheet given to you by the facility where your surgery was performed.  IF YOU HAVE DISABILITY OR FAMILY LEAVE FORMS, YOU MUST BRING THEM TO THE OFFICE FOR PROCESSING.  PLEASE DO NOT GIVE THEM TO YOUR DOCTOR.  1. A prescription for pain medication may be given to you upon discharge.  Take your pain medication as prescribed, if needed.  If narcotic pain medicine is not needed, then you may take acetaminophen (Tylenol) or ibuprofen (Advil) as needed. 2. Take your usually prescribed medications unless otherwise directed. 3. If you need a refill on your pain medication, please contact your pharmacy. They will contact our office to request authorization.  Prescriptions will not be filled after 5pm or on week-ends. 4. You should follow a light diet the first few days after arrival home, such as soup and crackers, pudding, etc.unless your doctor has advised otherwise. A high-fiber, low fat diet can be resumed as tolerated.   Be sure to include lots of fluids daily. Most patients will experience some swelling and bruising on the chest and neck area.  Ice packs will help.  Swelling and bruising can take several days to resolve 5. Most patients will experience some swelling and bruising in the area of the incision. Ice pack will help. Swelling and bruising can take several days to resolve..  6. It is common to experience some constipation if taking pain medication after surgery.  Increasing fluid intake and taking a stool softener will usually help or prevent this problem from occurring.  A mild laxative (Milk of Magnesia or Miralax) should be taken according to package directions if there are no bowel movements after 48 hours. 7.  You may have steri-strips (small skin tapes) in place directly over the incision.  These strips should be left on the skin for 7-10 days.  If your  surgeon used skin glue on the incision, you may shower in 24 hours.  The glue will flake off over the next 2-3 weeks.  Any sutures or staples will be removed at the office during your follow-up visit. You may find that a light gauze bandage over your incision may keep your staples from being rubbed or pulled. You may shower and replace the bandage daily. 8. ACTIVITIES:  You may resume regular (light) daily activities beginning the next day--such as daily self-care, walking, climbing stairs--gradually increasing activities as tolerated.  You may have sexual intercourse when it is comfortable.  Refrain from any heavy lifting or straining until approved by your doctor. a. You may drive when you no longer are taking prescription pain medication, you can comfortably wear a seatbelt, and you can safely maneuver your car and apply brakes b. Return to Work: ___________________________________ 9. You should see your doctor in the office for a follow-up appointment approximately two weeks after your surgery.  Make sure that you call for this appointment within a day or two after you arrive home to insure a convenient appointment time. OTHER INSTRUCTIONS:  _____________________________________________________________ _____________________________________________________________  WHEN TO CALL YOUR DOCTOR: 1. Fever over 101.0 2. Inability to urinate 3. Nausea and/or vomiting 4. Extreme swelling or bruising 5. Continued bleeding from incision. 6. Increased pain, redness, or drainage from the incision. 7. Difficulty swallowing or breathing 8. Muscle cramping or spasms. 9. Numbness or tingling in hands or feet or around lips.  The clinic staff is available to   answer your questions during regular business hours.  Please don't hesitate to call and ask to speak to one of the nurses if you have concerns.  For further questions, please visit [email or web address]   

## 2016-04-30 LAB — BASIC METABOLIC PANEL
ANION GAP: 10 (ref 5–15)
BUN: 7 mg/dL (ref 6–20)
CHLORIDE: 103 mmol/L (ref 101–111)
CO2: 28 mmol/L (ref 22–32)
Calcium: 9.2 mg/dL (ref 8.9–10.3)
Creatinine, Ser: 0.59 mg/dL (ref 0.44–1.00)
GFR calc Af Amer: >60 mL/min (ref >60)
GLUCOSE: 79 mg/dL (ref 65–99)
POTASSIUM: 4.6 mmol/L (ref 3.5–5.1)
Sodium: 141 mmol/L (ref 135–145)

## 2016-04-30 LAB — CBC
HEMATOCRIT: 33 % — AB (ref 36.0–46.0)
HEMOGLOBIN: 10.2 g/dL — AB (ref 12.0–15.0)
MCH: 27.6 pg (ref 26.0–34.0)
MCHC: 30.9 g/dL (ref 30.0–36.0)
MCV: 89.2 fL (ref 78.0–100.0)
Platelets: 260 10*3/uL (ref 150–400)
RBC: 3.7 MIL/uL — ABNORMAL LOW (ref 3.87–5.11)
RDW: 13.7 % (ref 11.5–15.5)
WBC: 4.9 10*3/uL (ref 4.0–10.5)

## 2016-04-30 MED ORDER — OXYCODONE-ACETAMINOPHEN 5-325 MG PO TABS: 1.0000 | ORAL_TABLET | ORAL | 0 refills | Status: DC | PRN

## 2016-04-30 NOTE — Progress Notes (Signed)
Discharged home accompanied by friend.

## 2016-04-30 NOTE — Discharge Summary (Signed)
[facility] Discharge Summary   Patient ID: Dorothy White MRN: [id number] DOB/AGE: Apr 20, 1951 65 y.o.  Admit date: 04/25/2016 Discharge date: 04/30/2016  Admitting Diagnosis: Diverticulitis of sigmoid colon  Discharge Diagnosis Patient Active Problem List   Diagnosis Date Noted  . Diverticulitis of sigmoid colon 04/25/2016  . Infection due to ESBL-producing Escherichia coli/Diverticular Abscess 03/15/2016  . Diverticulitis 03/12/2016  . Chronic pain syndrome 03/12/2016  . Lesion of pancreas 03/12/2016  . History of chest pain 03/12/2016  . Hypokalemia 03/12/2016  . Angina, class IV ([place]) - chest tightness and pressure with dyspnea with minimal exertion 02/17/2016    Class: Question of  . TIA (transient ischemic attack) 12/26/2015  . DOE (dyspnea on exertion) 12/26/2015  . Right sided weakness 12/17/2015  . Ataxia 12/17/2015  . Shoulder fracture 02/15/2015  . Chronic headache 10/11/2013  . Weakness generalized 08/12/2013  . Dizziness and giddiness 08/12/2013  . Abnormality of gait 07/11/2013  . Hyperlipidemia 05/07/2007  . COMMON MIGRAINE 05/07/2007  . PREMATURE VENTRICULAR CONTRACTIONS, FREQUENT 05/07/2007  . GERD 05/07/2007  . DIVERTICULOSIS, COLON 05/07/2007  . 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 OSTEOPENIA 05/07/2007  . Anxiety and depression 03/24/2007  . HEMORRHOIDS 03/24/2007  . ALLERGIC RHINITIS 03/24/2007  . LOW BACK PAIN 03/24/2007  . MIGRAINES, HX OF 03/24/2007    Consultants None  Imaging: CT abdomen pelvis w contrast 04/03/16: 1. Small focal area of inflammatory changes adjacent to the sigmoid colon at the site of previously drained abscess likely reflecting a small resolving phlegmon. No focal drainable fluid collection to suggest a persistent abscess. Mild persistent resolving sigmoid diverticulitis.  Procedures Laparoscopic sigmoid colectomy9/29/17 Dr. Haydee Monica Course:  Dorothy White is a 65yo female who  presented to [facility] with abdominal pain.  She was admitted 8/16 and discharged 8/23 for diverticulitis of the sigmoid colon. A percutaneous drain was placed during that admission and subsequently removed. She had Escherichia coli which was extended spectrum beta-lactamase positive. She was sent home on Invanz. She had been off of antibiotics for approximately a week before returning to the hospital. At this point it was decided that she would be best treated with laparoscopic sigmiod colectomy. Patient underwent this procedure on 04/25/16 and was placed on the entereg protocol.  On POD1 Dorothy White was passing flatus and had a BM. While her bowel function was quick to return she did have some significant pain postoperatively that kept her in the hospital. Her diet was slowly advanced as tolerated.  On POD5 the patient was voiding well, tolerating diet, ambulating well, pain well controlled, vital signs stable, incisions c/d/i and felt stable for discharge home.  Patient will follow up in our office in 2 weeks and knows to call with questions or concerns.  She will call to confirm appointment date/time.    Physical Exam: Gen: Alert, NAD, pleasant Pulm: CTAB, no W/R/R Abd: Soft, ND, +BS, appropriately tender, midline and lap incisions C/D/I    Medication List    STOP taking these medications   oxyCODONE-acetaminophen 10-325 MG tablet Commonly known as:  PERCOCET Replaced by:  oxyCODONE-acetaminophen 5-325 MG tablet     TAKE these medications   ARTIFICIAL TEAR OP Apply 1 drop to eye daily as needed (dry eyes).   aspirin EC 81 MG tablet Take 81 mg by mouth every other day.   busPIRone 5 MG tablet Commonly known as:  BUSPAR Take 1 tablet by mouth 3 (three) times daily.   calcium-vitamin D 500-200 MG-UNIT tablet Commonly  known as:  OSCAL WITH D Take 1 tablet by mouth daily with breakfast.   cetirizine 10 MG tablet Commonly known as:  ZYRTEC Take 10 mg by mouth daily.   clonazePAM 1 MG  tablet Commonly known as:  KLONOPIN Take 1 tablet (1 mg total) by mouth daily as needed (anxiety/sleep.). What changed:  when to take this  reasons to take this   FLUoxetine 20 MG capsule Commonly known as:  PROZAC Take 60 mg by mouth daily.   ibuprofen 200 MG tablet Commonly known as:  ADVIL,MOTRIN Take 600-800 mg by mouth every 6 (six) hours as needed for mild pain.   LUTEIN PO Take 1 capsule by mouth every evening.   mometasone 50 MCG/ACT nasal spray Commonly known as:  NASONEX Place 2 sprays into the nose daily.   nystatin 100000 UNIT/ML suspension Commonly known as:  MYCOSTATIN Take 5 mLs (500,000 Units total) by mouth 4 (four) times daily. What changed:  when to take this  reasons to take this   ONE-A-DAY WOMENS 50 PLUS PO Take 1 tablet by mouth every evening.   oxyCODONE-acetaminophen 5-325 MG tablet Commonly known as:  PERCOCET/ROXICET Take 1-2 tablets by mouth every 4 (four) hours as needed for moderate pain or severe pain. Replaces:  oxyCODONE-acetaminophen 10-325 MG tablet   RELPAX 40 MG tablet Generic drug:  eletriptan Take 1 tablet by mouth as needed for migraine and may take 1 additional tablet in 2 hours if no relief   traZODone 100 MG tablet Commonly known as:  DESYREL Take 200 mg by mouth at bedtime.   VITAMIN C PO Take 1 tablet by mouth every evening.   VITAMIN D PO Take 1 capsule by mouth every evening.   vitamin E 100 UNIT capsule Take 100 Units by mouth every evening.        Follow-up Information    BYERLY,FAERA, MD. Call today.   Specialty:  General Surgery Why:  Please call as soon as you leave the hospital to make a follow-up appointment with Dr. Lavonna Monarch information: [place] [address] [phone]           Signed: [name], [facility] 04/30/2016, 11:05 AM Pager: [phone] Consults: [phone] Mon-Fri 7:00 am-4:30 pm Sat-Sun 7:00 am-11:30  am

## 2016-04-30 NOTE — Progress Notes (Signed)
Patient ID: Dorothy White, female   DOB: 1951-02-10, 65 y.o.   MRN: [id number]  [facility] Surgery Progress Note  5 Days Post-Op  Subjective: Feeling well this morning. Pain controlled on PO medication. Tolerating diet. Ready to go home but unfortunately brother can no longer pick her up due to car trouble; she feels comfortable calling cab for ride.  Objective: Vital signs in last 24 hours: Temp:  [98.2 F (36.8 C)-98.8 F (37.1 C)] 98.2 F (36.8 C) (10/04 0522) Pulse Rate:  [63-71] 63 (10/04 0522) Resp:  [17] 17 (10/04 0522) BP: (135-145)/(55-67) 135/56 (10/04 0522) SpO2:  [98 %-99 %] 99 % (10/04 0522) Last BM Date: 04/29/16  Intake/Output from previous day: 10/03 0701 - 10/04 0700 In: 360 [P.O.:360] Out: -  Intake/Output this shift: No intake/output data recorded.  PE: Gen: Alert, NAD, pleasant Pulm: CTAB, no W/R/R Abd: Soft, ND, +BS, appropriately tender, midline and lap incisions C/D/I  Lab Results:   Recent Labs  04/29/16 0500 04/30/16 0326  WBC 4.8 4.9  HGB 10.1* 10.2*  HCT 33.2* 33.0*  PLT 233 260   BMET  Recent Labs  04/29/16 0500 04/30/16 0326  NA 141 141  K 4.1 4.6  CL 106 103  CO2 30 28  GLUCOSE 104* 79  BUN <5* 7  CREATININE 0.50 0.59  CALCIUM 8.9 9.2   PT/INR No results for input(s): LABPROT, INR in the last 72 hours. CMP     Component Value Date/Time   NA 141 04/30/2016 0326   K 4.6 04/30/2016 0326   CL 103 04/30/2016 0326   CO2 28 04/30/2016 0326   GLUCOSE 79 04/30/2016 0326   BUN 7 04/30/2016 0326   CREATININE 0.59 04/30/2016 0326   CREATININE 0.95 02/15/2016 1536   CALCIUM 9.2 04/30/2016 0326   CALCIUM 9.4 01/13/2007 2356   PROT 5.2 (L) 03/17/2016 1105   ALBUMIN 2.4 (L) 03/17/2016 1105   AST 24 03/17/2016 1105   ALT 12 (L) 03/17/2016 1105   ALKPHOS 60 03/17/2016 1105   BILITOT 0.4 03/17/2016 1105   GFRNONAA >60 04/30/2016 0326   GFRAA >60 04/30/2016 0326   Lipase     Component Value Date/Time   LIPASE  16 03/12/2016 1321       Studies/Results: No results found.  Anti-infectives: Anti-infectives    Start     Dose/Rate Route Frequency Ordered Stop   04/25/16 1400  clindamycin (CLEOCIN) IVPB 900 mg     900 mg 100 mL/hr over 30 Minutes Intravenous Every 8 hours 04/25/16 1311 04/25/16 1437   04/25/16 0745  gentamicin (GARAMYCIN) 270 mg in dextrose 5 % 100 mL IVPB  Status:  Discontinued     270 mg 106.8 mL/hr over 60 Minutes Intravenous Every 24 hours 04/25/16 0734 04/25/16 1309   04/25/16 0745  clindamycin (CLEOCIN) IVPB 900 mg  Status:  Discontinued     900 mg 100 mL/hr over 30 Minutes Intravenous To Surgery 04/25/16 0734 04/25/16 1309       Assessment/Plan S/p laparoscopic sigmoid colectomy9/29/17 Dr. Barry Dienes - Admission 8/16-8/23/16 for sigmoid diverticulitis with pericolonic abscess, s/p percutaneous drain - POD5 - tolerating soft diet and passing flatus, BM yesterday, pain controlled  Tobacco abuse MI (suggested on 01/03/16 stress test) CAD (mild by 02/20/16 cath) Arrhythmia Anxiety/depression chronic back pain HLD GERD migraines (TIA versus hemiplegic migraine '14) Anemia - Hg up to 10.2 today  FEN - soft VTE - lovenox  Plan - ready for discharge home. She will follow-up with dr. Barry Dienes  in clinic. Case management working on setting up home PT.   LOS: 5 days    [name] , [facility] 04/30/2016, 9:40 AM Pager: [phone] Consults: [phone] Mon-Fri 7:00 am-4:30 pm Sat-Sun 7:00 am-11:30 am

## 2016-05-03 NOTE — Anesthesia Postprocedure Evaluation (Signed)
Anesthesia Post Note  Patient: Dorothy White  Procedure(s) Performed: Procedure(s) (LRB): LAPAROSCOPIC SIGMOID COLECTOMY (N/A)  Patient location during evaluation: PACU Anesthesia Type: General Level of consciousness: awake and alert Pain management: pain level controlled Vital Signs Assessment: post-procedure vital signs reviewed and stable Respiratory status: spontaneous breathing, nonlabored ventilation, respiratory function stable and patient connected to nasal cannula oxygen Cardiovascular status: blood pressure returned to baseline and stable Postop Assessment: no signs of nausea or vomiting Anesthetic complications: no    Last Vitals:  Vitals:   04/29/16 2035 04/30/16 0522  BP: (!) 138/55 (!) 135/56  Pulse: 66 63  Resp: 17 17  Temp: 37.1 C 36.8 C    Last Pain:  Vitals:   04/30/16 1051  TempSrc:   PainSc: Asleep                 Labrenda Lasky,JAMES TERRILL

## 2016-05-30 ENCOUNTER — Encounter: Payer: Self-pay | Admitting: Physical Therapy

## 2016-05-30 ENCOUNTER — Ambulatory Visit: Payer: PPO | Attending: General Surgery | Admitting: Physical Therapy

## 2016-05-30 DIAGNOSIS — R252 Cramp and spasm: Secondary | ICD-10-CM | POA: Diagnosis present

## 2016-05-30 DIAGNOSIS — M6281 Muscle weakness (generalized): Secondary | ICD-10-CM | POA: Insufficient documentation

## 2016-05-30 NOTE — Therapy (Signed)
[facility] [address], [place], [place], [address] Phone: [phone]   Fax:  [phone]  Physical Therapy Evaluation  Patient Details  Name: Dorothy White MRN: [id number] Date of Birth: 1950/11/11 Referring Provider: Dr. Stark Klein  Encounter Date: 05/30/2016      PT End of Session - 05/30/16 1244    Visit Number 1   Number of Visits 10   Date for PT Re-Evaluation 07/25/16   Authorization Type medicare g-code 10th visit   PT Start Time 1010   PT Stop Time 1050   PT Time Calculation (min) 40 min   Activity Tolerance Patient tolerated treatment well   Behavior During Therapy [facility] for tasks assessed/performed      Past Medical History:  Diagnosis Date  . Anemia    ?  Marland Kitchen Anginal pain ([place])    last few months-mild CAD 01/2016 cath  . Anxiety   . Arrhythmia   . Arthritis    "everywhere" (04/25/2016)  . 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 Chronic back pain    "all over" (04/25/2016)  . Constipation   . Coronary artery disease   . Depression   . Diverticulitis   . GERD (gastroesophageal reflux disease)   . Headache    "weekly" (04/25/2016)  . HLD (hyperlipidemia)    hx (04/25/2016)  . 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 Myocardial infarction    "found it when I did stress test"  . Stroke [facility]) 06/2013   "mini" stroke , ?possibly hemaplegic migraine per pt    Past Surgical History:  Procedure Laterality Date  . AUGMENTATION MAMMAPLASTY  1980  . BASAL CELL CARCINOMA EXCISION     "tip of my nose"  . BUNIONECTOMY Bilateral 10/2003  . CARDIAC CATHETERIZATION  02/20/2016  . CARDIAC CATHETERIZATION N/A 02/20/2016   Procedure: Left Heart Cath and Coronary Angiography;  Surgeon: Jettie Booze, MD;  Location: [place] [facility];  Service: Cardiovascular;  Laterality: N/A;  . COLONOSCOPY    . DILATION AND CURETTAGE OF UTERUS     . FRACTURE SURGERY    . IR GENERIC HISTORICAL  03/18/2016   IR SINUS/FIST TUBE CHK-NON GI 03/18/2016 Aletta Edouard, MD [facility]-INTER[facility] RAD  . LAPAROSCOPIC SIGMOID COLECTOMY  04/25/2016  . LAPAROSCOPIC SIGMOID COLECTOMY N/A 04/25/2016   Procedure: LAPAROSCOPIC SIGMOID COLECTOMY;  Surgeon: Stark Klein, MD;  Location: [place];  Service: General;  Laterality: N/A;  . OOPHORECTOMY Bilateral ~ 1999  . ORIF HUMERUS FRACTURE Right 02/15/2015   Procedure: OPEN REDUCTION INTERNAL FIXATION (ORIF) PROXIMAL HUMERUS FRACTURE;  Surgeon: Netta Cedars, MD;  Location: [place];  Service: Orthopedics;  Laterality: Right;  . RHINOPLASTY  1976  . TONSILLECTOMY    . TUBAL LIGATION  ~ 1983  . VAGINAL HYSTERECTOMY  ~ 1998    There were no vitals filed for this visit.       Subjective Assessment - 05/30/16 1222    Subjective Patient is status post sigmoid coloectomy on 04/25/2016.  Patient was in the hospital for diverticulitis with abdcess. Patient reports her pain is in the lower abdominal area at intermittent level 3/10. Pain is a dull deep pain and is worse with sitting and stretching.  Pain is better with laying down.  Patient also has pain in anus with sitting. Surgical site is healed but has decreased mobility.  Skin above the scar is overlapping due to the fascia  tightness located in the lower tissue. Increased thickness on the fascia below the scar.  Patient scar is sensitive to touch. Palpable tenderness located in right levator ani and right bulbocarenosus.  Lumbar left sidebending and extension decreased by 25%. Right hip external rotation and internal rotation is 4/5. Patient has difficulty with standing on one leg.  Patient reports she has to strain to go to the bathroom.  Patient is moderate complexity evaluation due to her pain evolving, history of diverticulitis, s/p sigmoid colectomy, history of stroke, and osteopenia that will impact her care.  Patient will benefit from skilled therapy to increase her strength  and improve scar mobility.    Pertinent History s/p sigmoid colectomy 04/25/2016   Limitations Sitting   How long can you sit comfortably? painful   Patient Stated Goals reduce pain and improve scar mobility   Currently in Pain? Yes   Pain Score 3    Pain Location Abdomen   Pain Orientation Mid   Pain Descriptors / Indicators Dull   Pain Type Acute pain   Pain Onset More than a month ago   Pain Frequency Intermittent   Aggravating Factors  sitting   Pain Relieving Factors laying down   Multiple Pain Sites No            [facility] PT Assessment - 05/30/16 0001      Assessment   Medical Diagnosis K57.20 Diverticulitis of large intesting with abscess, unspecified bleeding status   Referring Provider Dr. Stark Klein   Onset Date/Surgical Date 04/25/16   Prior Therapy None     Precautions   Precautions None     Restrictions   Weight Bearing Restrictions No     Balance Screen   Has the patient fallen in the past 6 months No   Has the patient had a decrease in activity level because of a fear of falling?  No   Is the patient reluctant to leave their home because of a fear of falling?  No     Home Ecologist residence     Prior Function   Level of Independence Independent   Vocation Retired     Associate Professor   Overall Cognitive Status Within Functional Limits for tasks assessed     Observation/Other Assessments   Focus on Therapeutic Outcomes (FOTO)  Physical therapist discrection is 40% limitation due to patient not walking or exercise due to pain and deconditioned from surgery  goal is 20% limitation     Posture/Postural Control   Posture/Postural Control No significant limitations     ROM / Strength   AROM / PROM / Strength AROM;Strength     AROM   Lumbar Extension decreased by 25%   Lumbar - Left Side Bend decreased by 25%     Strength   Right Hip External Rotation  4/5   Right Hip Internal Rotation 4/5     Palpation   Palpation  comment tenderness located in right levator ani and bulbocarenosus                 Pelvic Floor Special Questions - 05/30/16 0001    Currently Sexually Active No   Urinary Leakage No   Exam Type Deferred                    PT Short Term Goals - 05/30/16 1238      PT SHORT TERM GOAL #1   Title independent with scar massae   Time  4   Period Weeks   Status New     PT SHORT TERM GOAL #2   Title ability to demonstate correct toileting technique to reduce strain on pelvic floor   Time 4   Period Weeks   Status New     PT SHORT TERM GOAL #3   Title lower abdominal pain decreased >/= 25% due to improved mobility of scar   Time 4   Period Weeks   Status New           PT Long Term Goals - 05/30/16 1239      PT LONG TERM GOAL #1   Title independent with HEP    Time 8   Period Weeks   Status New     PT LONG TERM GOAL #2   Title abdominal pain decreased >/= 75% due to improved mobility of scar and abdominal tissue   Time 8   Period Weeks   Status New     PT LONG TERM GOAL #3   Title walking or riding stationary bike 30 minutes per day to build up endurance   Time 8   Period Weeks   Status New     PT LONG TERM GOAL #4   Title strain to have a bowel movement reduce >/= 50% due to ability to relax the pelvic floor   Time 8   Period Weeks   Status New               Plan - 05/30/16 1234    Clinical Impression Statement Patient is status post sigmoid coloectomy on 04/25/2016.  Patient was in the hospital for diverticulitis with abdcess. Patient reports her pain is in the lower abdominal area at intermittent level 3/10. Pain is a dull deep pain and is worse with sitting and stretching.  Pain is better with laying down.  Patient also has pain in anus with sitting. Surgical site is healed but has decreased mobility.  Skin above the scar is overlapping due to the fascia tightness located in the lower tissue. Increased thickness on the fascia below  the scar.  Patient scar is sensitive to touch. Palpable tenderness located in right levator ani and right bulbocarenosus.  Lumbar left sidebending and extension decreased by 25%. Right hip external rotation and internal rotation is 4/5. Patient has difficulty with standing on one leg.  Patient reports she has to strain to go to the bathroom.  Patient is moderate complexity evaluation due to her pain evolving, history of diverticulitis, s/p sigmoid colectomy, history of stroke, and osteopenia that will impact her care.  Patient will benefit from skilled therapy to increase her strength and improve scar mobility.    Rehab Potential Excellent   Clinical Impairments Affecting Rehab Potential None   PT Frequency 2x / week   PT Duration 8 weeks   PT Treatment/Interventions ADLs/Self Care Home Management;Biofeedback;Cryotherapy;Electrical Stimulation;Moist Heat;Ultrasound;Neuromuscular re-education;Balance training;Therapeutic exercise;Therapeutic activities;Patient/family education;Manual techniques;Scar mobilization;Passive range of motion;Energy conservation   PT Next Visit Plan scar mobilization; abdominal massage; toileting technique; pelvic floor soft tissue work; diaphragmatic breathing; bowel health; sitting posture; flexibility exericises   PT Home Exercise Plan flexibility exericses; scar massage; posture   Recommended Other Services None   Consulted and Agree with Plan of Care Patient      Patient will benefit from skilled therapeutic intervention in order to improve the following deficits and impairments:  Impaired flexibility, Decreased range of motion, Pain, Decreased strength, Decreased activity tolerance, Decreased scar mobility, Decreased endurance  Visit  Diagnosis: Muscle weakness (generalized) - Plan: PT plan of care cert/re-cert  Cramp and spasm - Plan: PT plan of care cert/re-cert      G-Codes - [id number]    Functional Assessment Tool Used Therapist discretion due to fatique  and weakness is 40% limitation  goal is 20% limitation   Functional Limitation Other PT primary   Other PT Primary Current Status [id number]) At least 40 percent but less than 60 percent impaired, limited or restricted   Other PT Primary Goal Status [id number]) At least 20 percent but less than 40 percent impaired, limited or restricted       Problem List Patient Active Problem List   Diagnosis Date Noted  . Diverticulitis of sigmoid colon 04/25/2016  . Infection due to ESBL-producing Escherichia coli/Diverticular Abscess 03/15/2016  . Diverticulitis 03/12/2016  . Chronic pain syndrome 03/12/2016  . Lesion of pancreas 03/12/2016  . History of chest pain 03/12/2016  . Hypokalemia 03/12/2016  . 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 TIA (transient ischemic attack) 12/26/2015  . DOE (dyspnea on exertion) 12/26/2015  . Right sided weakness 12/17/2015  . Ataxia 12/17/2015  . Shoulder fracture 02/15/2015  . Chronic headache 10/11/2013  . Weakness generalized 08/12/2013  . Dizziness and giddiness 08/12/2013  . Abnormality of gait 07/11/2013  . Hyperlipidemia 05/07/2007  . COMMON MIGRAINE 05/07/2007  . PREMATURE VENTRICULAR CONTRACTIONS, FREQUENT 05/07/2007  . GERD 05/07/2007  . DIVERTICULOSIS, COLON 05/07/2007  . 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 OSTEOPENIA 05/07/2007  . Anxiety and depression 03/24/2007  . HEMORRHOIDS 03/24/2007  . ALLERGIC RHINITIS 03/24/2007  . LOW BACK PAIN 03/24/2007  . MIGRAINES, HX OF 03/24/2007    Earlie Counts, PT 05/30/16 12:46 PM   [facility] [address] [place], [place], [address] Phone: [phone]   Fax:  [phone]  Name: Dorothy White MRN: [id number] Date of Birth: 08-May-1951

## 2016-06-02 ENCOUNTER — Ambulatory Visit: Payer: PPO | Admitting: Physical Therapy

## 2016-06-02 ENCOUNTER — Encounter: Payer: Self-pay | Admitting: Physical Therapy

## 2016-06-02 DIAGNOSIS — M6281 Muscle weakness (generalized): Secondary | ICD-10-CM

## 2016-06-02 DIAGNOSIS — R252 Cramp and spasm: Secondary | ICD-10-CM

## 2016-06-02 NOTE — Patient Instructions (Signed)
Toileting Techniques for Bowel Movements (Defecation) Using your belly (abdomen) and pelvic floor muscles to have a bowel movement is usually instinctive.  Sometimes people can have problems with these muscles and have to relearn proper defecation (emptying) techniques.  If you have weakness in your muscles, organs that are falling out, decreased sensation in your pelvis, or ignore your urge to go, you may find yourself straining to have a bowel movement.  You are straining if you are: . holding your breath or taking in a huge gulp of air and holding it  . keeping your lips and jaw tensed and closed tightly . turning red in the face because of excessive pushing or forcing . developing or worsening your  hemorrhoids . getting faint while pushing . not emptying completely and have to defecate many times a day  If you are straining, you are actually making it harder for yourself to have a bowel movement.  Many people find they are pulling up with the pelvic floor muscles and closing off instead of opening the anus. Due to lack pelvic floor relaxation and coordination the abdominal muscles, one has to work harder to push the feces out.  Many people have never been taught how to defecate efficiently and effectively.  Notice what happens to your body when you are having a bowel movement.  While you are sitting on the toilet pay attention to the following areas: . Jaw and mouth position . Angle of your hips   . Whether your feet touch the ground or not . Arm placement  . Spine position . Waist . Belly tension . Anus (opening of the anal canal)  An Evacuation/Defecation Plan   Here are the 4 basic points:  1. Lean forward enough for your elbows to rest on your knees 2. Support your feet on the floor or use a low stool if your feet don't touch the floor  3. Push out your belly as if you have swallowed a beach ball-you should feel a widening of your waist 4. Open and relax your pelvic floor muscles,  rather than tightening around the anus      The following conditions my require modifications to your toileting posture:  . If you have had surgery in the past that limits your back, hip, pelvic, knee or ankle flexibility . Constipation   Your healthcare practitioner may make the following additional suggestions and adjustments:  1) Sit on the toilet  a) Make sure your feet are supported. b) Notice your hip angle and spine position-most people find it effective to lean forward or raise their knees, which can help the muscles around the anus to relax  c) When you lean forward, place your forearms on your thighs for support  2) Relax suggestions a) Breath deeply in through your nose and out slowly through your mouth as if you are smelling the flowers and blowing out the candles. b) To become aware of how to relax your muscles, contracting and releasing muscles can be helpful.  Pull your pelvic floor muscles in tightly by using the image of holding back gas, or closing around the anus (visualize making a circle smaller) and lifting the anus up and in.  Then release the muscles and your anus should drop down and feel open. Repeat 5 times ending with the feeling of relaxation. c) Keep your pelvic floor muscles relaxed; let your belly bulge out. d) The digestive tract starts at the mouth and ends at the anal opening, so be   sure to relax both ends of the tube.  Place your tongue on the roof of your mouth with your teeth separated.  This helps relax your mouth and will help to relax the anus at the same time.  3) Empty (defecation) a) Keep your pelvic floor and sphincter relaxed, then bulge your anal muscles.  Make the anal opening wide.  b) Stick your belly out as if you have swallowed a beach ball. c) Make your belly wall hard using your belly muscles while continuing to breathe. Doing this makes it easier to open your anus. d) Breath out and give a grunt (or try using other sounds such as  ahhhh, shhhhh, ohhhh or grrrrrrr).  4) Finish a) As you finish your bowel movement, pull the pelvic floor muscles up and in.  This will leave your anus in the proper place rather than remaining pushed out and down. If you leave your anus pushed out and down, it will start to feel as though that is normal and give you incorrect signals about needing to have a bowel movement.    Dorothy White, PT [facility] [address] [place], [place] [address]  

## 2016-06-02 NOTE — Therapy (Signed)
[facility] [address], [place], [place], [address] Phone: [phone]   Fax:  [phone]  Physical Therapy Treatment  Patient Details  Name: Dorothy White MRN: [id number] Date of Birth: 03/24/1951 Referring Provider: Dr. Stark Klein  Encounter Date: 06/02/2016      PT End of Session - 06/02/16 1523    Visit Number 2   Number of Visits 10   Date for PT Re-Evaluation 07/25/16   Authorization Type medicare g-code 10th visit   PT Start Time 1146   PT Stop Time 1231   PT Time Calculation (min) 45 min   Activity Tolerance Patient tolerated treatment well;No increased pain   Behavior During Therapy WFL for tasks assessed/performed      Past Medical History:  Diagnosis Date  . Anemia    ?  Marland Kitchen Anginal pain ([place])    last few months-mild CAD 01/2016 cath  . Anxiety   . Arrhythmia   . Arthritis    "everywhere" (04/25/2016)  . 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 Chronic back pain    "all over" (04/25/2016)  . Constipation   . Coronary artery disease   . Depression   . Diverticulitis   . GERD (gastroesophageal reflux disease)   . Headache    "weekly" (04/25/2016)  . HLD (hyperlipidemia)    hx (04/25/2016)  . 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 Myocardial infarction    "found it when I did stress test"  . Stroke [facility]) 06/2013   "mini" stroke , ?possibly hemaplegic migraine per pt    Past Surgical History:  Procedure Laterality Date  . AUGMENTATION MAMMAPLASTY  1980  . BASAL CELL CARCINOMA EXCISION     "tip of my nose"  . BUNIONECTOMY Bilateral 10/2003  . CARDIAC CATHETERIZATION  02/20/2016  . CARDIAC CATHETERIZATION N/A 02/20/2016   Procedure: Left Heart Cath and Coronary Angiography;  Surgeon: Jettie Booze, MD;  Location: [facility];  Service: Cardiovascular;  Laterality: N/A;  . COLONOSCOPY    . DILATION AND  CURETTAGE OF UTERUS    . FRACTURE SURGERY    . IR GENERIC HISTORICAL  03/18/2016   IR SINUS/FIST TUBE CHK-NON GI 03/18/2016 Aletta Edouard, MD [facility]  . LAPAROSCOPIC SIGMOID COLECTOMY  04/25/2016  . LAPAROSCOPIC SIGMOID COLECTOMY N/A 04/25/2016   Procedure: LAPAROSCOPIC SIGMOID COLECTOMY;  Surgeon: Stark Klein, MD;  Location: [place];  Service: General;  Laterality: N/A;  . OOPHORECTOMY Bilateral ~ 1999  . ORIF HUMERUS FRACTURE Right 02/15/2015   Procedure: OPEN REDUCTION INTERNAL FIXATION (ORIF) PROXIMAL HUMERUS FRACTURE;  Surgeon: Netta Cedars, MD;  Location: [facility];  Service: Orthopedics;  Laterality: Right;  . RHINOPLASTY  1976  . TONSILLECTOMY    . TUBAL LIGATION  ~ 1983  . VAGINAL HYSTERECTOMY  ~ 1998    There were no vitals filed for this visit.      Subjective Assessment - 06/02/16 1229    Subjective Pt reports she if feeling 50% better and having success with self massage of scar.  Reports she has not done any exercise, but she cleaned her house all weekend and was sore from that.  Reports very small amount of pain that "feels like she is getting a UTI" but then it goes away.   Currently in Pain? No/denies  [facility] PT Treatment/Exercise - 06/02/16 0001      Neuro Re-ed    Neuro Re-ed Details  performed diaphragmatic breathing using hand placement on chest and abdomen for feedback     Exercises   Exercises Other Exercises  prone press ups - 10x, supine TA contract with marching 10x     Manual Therapy   Manual Therapy Soft tissue mobilization;Myofascial release  scar tissue massage and MFR to abdomen, STM adductor LLE,                 PT Education - 06/02/16 1522    Education provided Yes   Education Details reviewed toileting techniques, diaphragmatic breathing, and prone press ups   Person(s) Educated Patient   Methods Explanation;Demonstration;Tactile cues;Verbal cues;Handout   Comprehension Verbalized  understanding;Verbal cues required;Tactile cues required          PT Short Term Goals - 06/02/16 1226      PT SHORT TERM GOAL #1   Title independent with scar massage   Time 4   Period Weeks   Status On-going     PT SHORT TERM GOAL #3   Title lower abdominal pain decreased >/= 25% due to improved mobility of scar   Baseline --   Time 4   Status --  MET - feels 50% better           PT Long Term Goals - 05/30/16 1239      PT LONG TERM GOAL #1   Title independent with HEP    Time 8   Period Weeks   Status New     PT LONG TERM GOAL #2   Title abdominal pain decreased >/= 75% due to improved mobility of scar and abdominal tissue   Time 8   Period Weeks   Status New     PT LONG TERM GOAL #3   Title walking or riding stationary bike 30 minutes per day to build up endurance   Time 8   Period Weeks   Status New     PT LONG TERM GOAL #4   Title strain to have a bowel movement reduce >/= 50% due to ability to relax the pelvic floor   Time 8   Period Weeks   Status New               Plan - 06/02/16 1526    Clinical Impression Statement Pt is progressing with scar massage and is experiencing 50% decrease of pain.  Pt continues to need a lot of verbal and tactile cues for breathing and she has not initiated walking and bike riding plan for exercise yet.  Pt continues to have muscle spasms in adductors and lower abdominal muscle attachments which released well with massage.  Continues to need skilled PT for increased muscle coordination and release of restricted tissue.   Rehab Potential Excellent   Clinical Impairments Affecting Rehab Potential None   PT Frequency 2x / week   PT Duration 8 weeks   PT Treatment/Interventions ADLs/Self Care Home Management;Biofeedback;Cryotherapy;Electrical Stimulation;Moist Heat;Ultrasound;Neuromuscular re-education;Balance training;Therapeutic exercise;Therapeutic activities;Patient/family education;Manual techniques;Scar  mobilization;Passive range of motion;Energy conservation   PT Next Visit Plan scar mobilization; abdominal massage; pelvic floor soft tissue work; diaphragmatic breathing; bowel health; sitting posture; flexibility exericises   PT Home Exercise Plan flexibility exericses; scar massage; posture, breathing   Consulted and Agree with Plan of Care Patient      Patient will benefit from skilled therapeutic intervention in order to improve the following deficits  and impairments:  Impaired flexibility, Decreased range of motion, Pain, Decreased strength, Decreased activity tolerance, Decreased scar mobility, Decreased endurance  Visit Diagnosis: Muscle weakness (generalized)  Cramp and spasm     Problem List Patient Active Problem List   Diagnosis Date Noted  . Diverticulitis of sigmoid colon 04/25/2016  . Infection due to ESBL-producing Escherichia coli/Diverticular Abscess 03/15/2016  . Diverticulitis 03/12/2016  . Chronic pain syndrome 03/12/2016  . Lesion of pancreas 03/12/2016  . History of chest pain 03/12/2016  . Hypokalemia 03/12/2016  . 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 TIA (transient ischemic attack) 12/26/2015  . DOE (dyspnea on exertion) 12/26/2015  . Right sided weakness 12/17/2015  . Ataxia 12/17/2015  . Shoulder fracture 02/15/2015  . Chronic headache 10/11/2013  . Weakness generalized 08/12/2013  . Dizziness and giddiness 08/12/2013  . Abnormality of gait 07/11/2013  . Hyperlipidemia 05/07/2007  . COMMON MIGRAINE 05/07/2007  . PREMATURE VENTRICULAR CONTRACTIONS, FREQUENT 05/07/2007  . GERD 05/07/2007  . DIVERTICULOSIS, COLON 05/07/2007  . 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 OSTEOPENIA 05/07/2007  . Anxiety and depression 03/24/2007  . HEMORRHOIDS 03/24/2007  . ALLERGIC RHINITIS 03/24/2007  . LOW BACK PAIN 03/24/2007  . MIGRAINES, HX OF 03/24/2007    Zannie Cove, PT 06/02/2016, 3:39 PM  [facility] [address] [place], [place], [address] Phone: [phone]   Fax:  [phone]  Name: Dorothy White MRN: [id number] Date of Birth: 02-20-1951

## 2016-06-04 ENCOUNTER — Encounter: Payer: BLUE CROSS/BLUE SHIELD | Admitting: Physical Therapy

## 2016-07-28 DIAGNOSIS — I219 Acute myocardial infarction, unspecified: Secondary | ICD-10-CM

## 2016-07-28 HISTORY — DX: Acute myocardial infarction, unspecified: I21.9

## 2016-08-12 DIAGNOSIS — M940 Chondrocostal junction syndrome [Tietze]: Secondary | ICD-10-CM | POA: Diagnosis not present

## 2016-08-24 ENCOUNTER — Emergency Department (HOSPITAL_COMMUNITY)
Admission: EM | Admit: 2016-08-24 | Discharge: 2016-08-24 | Disposition: A | Discharge status: Home / Self Care | Payer: PPO | Attending: Emergency Medicine | Admitting: Emergency Medicine

## 2016-08-24 ENCOUNTER — Emergency Department (HOSPITAL_COMMUNITY): Payer: PPO

## 2016-08-24 ENCOUNTER — Encounter (HOSPITAL_COMMUNITY): Payer: Self-pay

## 2016-08-24 DIAGNOSIS — W108XXA Fall (on) (from) other stairs and steps, initial encounter: Secondary | ICD-10-CM | POA: Insufficient documentation

## 2016-08-24 DIAGNOSIS — I252 Old myocardial infarction: Secondary | ICD-10-CM | POA: Diagnosis not present

## 2016-08-24 DIAGNOSIS — Z8673 Personal history of transient ischemic attack (TIA), and cerebral infarction without residual deficits: Secondary | ICD-10-CM | POA: Diagnosis not present

## 2016-08-24 DIAGNOSIS — I2510 Atherosclerotic heart disease of native coronary artery without angina pectoris: Secondary | ICD-10-CM | POA: Diagnosis not present

## 2016-08-24 DIAGNOSIS — M545 Low back pain, unspecified: Secondary | ICD-10-CM

## 2016-08-24 DIAGNOSIS — Z7982 Long term (current) use of aspirin: Secondary | ICD-10-CM | POA: Insufficient documentation

## 2016-08-24 DIAGNOSIS — Y939 Activity, unspecified: Secondary | ICD-10-CM | POA: Insufficient documentation

## 2016-08-24 DIAGNOSIS — G8929 Other chronic pain: Secondary | ICD-10-CM | POA: Diagnosis not present

## 2016-08-24 DIAGNOSIS — Y92009 Unspecified place in unspecified non-institutional (private) residence as the place of occurrence of the external cause: Secondary | ICD-10-CM | POA: Diagnosis not present

## 2016-08-24 DIAGNOSIS — Y999 Unspecified external cause status: Secondary | ICD-10-CM | POA: Diagnosis not present

## 2016-08-24 DIAGNOSIS — F17210 Nicotine dependence, cigarettes, uncomplicated: Secondary | ICD-10-CM | POA: Diagnosis not present

## 2016-08-24 DIAGNOSIS — W19XXXA Unspecified fall, initial encounter: Secondary | ICD-10-CM

## 2016-08-24 DIAGNOSIS — M533 Sacrococcygeal disorders, not elsewhere classified: Secondary | ICD-10-CM | POA: Diagnosis not present

## 2016-08-24 MED ORDER — ONDANSETRON 4 MG PO TBDP
4.0000 mg | ORAL_TABLET | Freq: Once | ORAL | Status: AC
  Administered 2016-08-24: 4 mg via ORAL
  Filled 2016-08-24: qty 1

## 2016-08-24 MED ORDER — OXYCODONE-ACETAMINOPHEN 5-325 MG PO TABS
2.0000 | ORAL_TABLET | Freq: Once | ORAL | Status: AC
  Administered 2016-08-24: 2 via ORAL
  Filled 2016-08-24: qty 2

## 2016-08-24 MED ORDER — IBUPROFEN 200 MG PO TABS: 600.0000 mg | ORAL_TABLET | Freq: Once | ORAL | Status: DC

## 2016-08-24 NOTE — ED Triage Notes (Addendum)
Per [facility], Pt, from home, c/o lower back pain x 1 day.  Pain score 9/10 w/ movement.  Pt reports that slipped and fell on steps x 2 days ago.  Pt slid down around 13 steps on her buttocks.  Hx of chronic back pain.  Sts she took ibuprofen w/o relief.       Pt has been ambulatory since fall.  Pt was able to lean forward on [facility] stretcher and unbuckle the strap at her ankles.

## 2016-08-24 NOTE — ED Notes (Signed)
Patient transported to CT 

## 2016-08-24 NOTE — ED Provider Notes (Signed)
[facility] [place] DEPT Provider Note    By signing my name below, I, [name], attest that this documentation has been prepared under the direction and in the presence of [name], PA-C. Electronically Signed: [name], ED Scribe. 08/24/16. 6:55 PM.    History   Chief Complaint Chief Complaint  Patient presents with  . Back Pain  . Fall    The history is provided by the patient and medical records. No language interpreter was used.    HPI Comments:  Dorothy White is a 66 y.o. female with PMHx of chronic back pain, CAD, HLD, brought in by EMS, who presents to the Emergency Department complaining of a mechanical fall down flight of stairs that occurred two days ago. Pt reports she was having a migraine (which has now subsided) when she slipped and slid down approximately 13 stairs on her buttocks. She reports associated gradually worsening low back and coccyx pain that radiates down bilateral buttocks.  No radiation to back of legs.  Pt states she has been able to urinate without difficulties since the fall. She has taken her normally prescribed Percocet for pain but has not taken any today. Movements, palpation and ambulation increase her pain. Pt denies alleviating factors. She denies head trauma, LOC, numbness, tingling or weakness of the lower extremities, bowel or bladder incontinence, urinary symptoms, bruising or wounds. She reports taking an 81 mg ASA "every now and then".  No anticoagulants.     Past Medical History:  Diagnosis Date  . Anemia    ?  Marland Kitchen Anginal pain ([place])    last few months-mild CAD 01/2016 cath  . Anxiety   . Arrhythmia   . Arthritis    "everywhere" (04/25/2016)  . 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 Chronic back pain    "all over" (04/25/2016)  . Constipation   . Coronary artery disease   . Depression   . Diverticulitis   . GERD (gastroesophageal reflux disease)   . Headache    "weekly" (04/25/2016)  . HLD  (hyperlipidemia)    hx (04/25/2016)  . 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 Myocardial infarction    "found it when I did stress test"  . Stroke [facility]) 06/2013   "mini" stroke , ?possibly hemaplegic migraine per pt    Patient Active Problem List   Diagnosis Date Noted  . Diverticulitis of sigmoid colon 04/25/2016  . Infection due to ESBL-producing Escherichia coli/Diverticular Abscess 03/15/2016  . Diverticulitis 03/12/2016  . Chronic pain syndrome 03/12/2016  . Lesion of pancreas 03/12/2016  . History of chest pain 03/12/2016  . Hypokalemia 03/12/2016  . Angina, class IV ([place]) - chest tightness and pressure with dyspnea with minimal exertion 02/17/2016    Class: Question of  . TIA (transient ischemic attack) 12/26/2015  . DOE (dyspnea on exertion) 12/26/2015  . Right sided weakness 12/17/2015  . Ataxia 12/17/2015  . Shoulder fracture 02/15/2015  . Chronic headache 10/11/2013  . Weakness generalized 08/12/2013  . Dizziness and giddiness 08/12/2013  . Abnormality of gait 07/11/2013  . Hyperlipidemia 05/07/2007  . COMMON MIGRAINE 05/07/2007  . PREMATURE VENTRICULAR CONTRACTIONS, FREQUENT 05/07/2007  . GERD 05/07/2007  . DIVERTICULOSIS, COLON 05/07/2007  . 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 OSTEOPENIA 05/07/2007  . Anxiety and depression 03/24/2007  . HEMORRHOIDS 03/24/2007  . ALLERGIC RHINITIS 03/24/2007  . LOW BACK PAIN 03/24/2007  . MIGRAINES, HX OF 03/24/2007    Past  Surgical History:  Procedure Laterality Date  . AUGMENTATION MAMMAPLASTY  1980  . BASAL CELL CARCINOMA EXCISION     "tip of my nose"  . BUNIONECTOMY Bilateral 10/2003  . CARDIAC CATHETERIZATION  02/20/2016  . CARDIAC CATHETERIZATION N/A 02/20/2016   Procedure: Left Heart Cath and Coronary Angiography;  Surgeon: Jettie Booze, MD;  Location: [place] [facility];  Service: Cardiovascular;  Laterality: N/A;  . COLONOSCOPY    .  DILATION AND CURETTAGE OF UTERUS    . FRACTURE SURGERY    . IR GENERIC HISTORICAL  03/18/2016   IR SINUS/FIST TUBE CHK-NON GI 03/18/2016 Aletta Edouard, MD [facility]-INTER[facility] RAD  . LAPAROSCOPIC SIGMOID COLECTOMY  04/25/2016  . LAPAROSCOPIC SIGMOID COLECTOMY N/A 04/25/2016   Procedure: LAPAROSCOPIC SIGMOID COLECTOMY;  Surgeon: Stark Klein, MD;  Location: [place];  Service: General;  Laterality: N/A;  . OOPHORECTOMY Bilateral ~ 1999  . ORIF HUMERUS FRACTURE Right 02/15/2015   Procedure: OPEN REDUCTION INTERNAL FIXATION (ORIF) PROXIMAL HUMERUS FRACTURE;  Surgeon: Netta Cedars, MD;  Location: [place];  Service: Orthopedics;  Laterality: Right;  . RHINOPLASTY  1976  . TONSILLECTOMY    . TUBAL LIGATION  ~ 1983  . VAGINAL HYSTERECTOMY  ~ 1998    OB History    No data available       Home Medications    Prior to Admission medications   Medication Sig Start Date End Date Taking? Authorizing Provider  ARTIFICIAL TEAR OP Apply 1 drop to eye daily as needed (dry eyes).    Historical Provider, MD  Ascorbic Acid (VITAMIN C PO) Take 1 tablet by mouth every evening.    Historical Provider, MD  aspirin EC 81 MG tablet Take 81 mg by mouth every other day.    Historical Provider, MD  busPIRone (BUSPAR) 5 MG tablet Take 1 tablet by mouth 3 (three) times daily. 02/12/16   Historical Provider, MD  calcium-vitamin D (OSCAL WITH D) 500-200 MG-UNIT per tablet Take 1 tablet by mouth daily with breakfast.    Historical Provider, MD  cetirizine (ZYRTEC) 10 MG tablet Take 10 mg by mouth daily.    Historical Provider, MD  Cholecalciferol (VITAMIN D PO) Take 1 capsule by mouth every evening.    Historical Provider, MD  clonazePAM (KLONOPIN) 1 MG tablet Take 1 tablet (1 mg total) by mouth daily as needed (anxiety/sleep.). Patient taking differently: Take 1 mg by mouth 2 (two) times daily as needed for anxiety (anxiety/sleep.).  02/15/16   Dorothy Spark, MD  FLUoxetine (PROZAC) 20 MG capsule Take 60 mg by mouth daily.      Historical Provider, MD  ibuprofen (ADVIL,MOTRIN) 200 MG tablet Take 600-800 mg by mouth every 6 (six) hours as needed for mild pain.    Historical Provider, MD  LUTEIN PO Take 1 capsule by mouth every evening.    Historical Provider, MD  mometasone (NASONEX) 50 MCG/ACT nasal spray Place 2 sprays into the nose daily.    Historical Provider, MD  Multiple Vitamins-Minerals (ONE-A-DAY WOMENS 50 PLUS PO) Take 1 tablet by mouth every evening.    Historical Provider, MD  nystatin (MYCOSTATIN) 100000 UNIT/ML suspension Take 5 mLs (500,000 Units total) by mouth 4 (four) times daily. Patient taking differently: Take 5 mLs by mouth daily as needed (flush).  04/01/16   Amy S Esterwood, PA-C  oxyCODONE-acetaminophen (PERCOCET/ROXICET) 5-325 MG tablet Take 1-2 tablets by mouth every 4 (four) hours as needed for moderate pain or severe pain. 04/30/16   [name], PA-C  RELPAX 40 MG tablet Take 1 tablet by mouth as needed for migraine and may take 1 additional tablet in 2 hours if no relief 01/16/16   Historical Provider, MD  traZODone (DESYREL) 100 MG tablet Take 200 mg by mouth at bedtime.     Historical Provider, MD  vitamin E 100 UNIT capsule Take 100 Units by mouth every evening.    Historical Provider, MD    Family History Family History  Problem Relation Age of Onset  . Lung cancer Mother 72  . Heart attack Father 79  . Hypertension Brother   . Esophageal cancer Brother   . Diabetes Paternal Grandmother     questionable  . Colon cancer Neg Hx   . Kidney disease Neg Hx   . Liver disease Neg Hx     Social History Social History  Substance Use Topics  . Smoking status: Current Some Day Smoker    Years: 34.00    Types: Cigarettes  . Smokeless tobacco: Never Used     Comment: 04/25/2016 "2 cigarettes/wik sometimes; sometimes nothing for months; real smoker til 2014"  . Alcohol use No     Comment: Denies 08/24/16     Allergies   Penicillins   Review of Systems Review of Systems    Constitutional: Negative for fever.  HENT: Negative for congestion and sore throat.   Eyes: Negative for visual disturbance.  Respiratory: Negative for cough and shortness of breath.   Cardiovascular: Negative for chest pain.  Gastrointestinal: Negative for abdominal pain, constipation, diarrhea, nausea and vomiting.       No bowel or bladder incontinence  Genitourinary: Negative for difficulty urinating.  Musculoskeletal: Positive for back pain and gait problem (secondary to pain). Negative for arthralgias and neck pain.  Skin: Negative for color change.  Neurological: Negative for dizziness, syncope, weakness, light-headedness, numbness and headaches.     Physical Exam Updated Vital Signs BP 122/56 (BP Location: Right Arm)   Pulse 66   Temp 97.8 F (36.6 C) (Oral)   Resp 18   SpO2 100%   Physical Exam  Constitutional: She is oriented to person, place, and time. She appears well-developed and well-nourished. No distress.  NAD.  HENT:  Head: Normocephalic and atraumatic.  Right Ear: External ear normal.  Left Ear: External ear normal.  Nose: Nose normal.  Mouth/Throat: Oropharynx is clear and moist. No oropharyngeal exudate.  Moist mucous membranes.  No nasal mucosa edema. No nasal discharge noted. Oropharynx and tonsils normal without edema, erythema, exudates or lesions.  Uvula midline. No trismus.   Eyes: Conjunctivae and EOM are normal. Pupils are equal, round, and reactive to light. No scleral icterus.  Neck: Normal range of motion. Neck supple. No JVD present.  Cardiovascular: Normal rate, regular rhythm and normal heart sounds.   No murmur heard. Pulmonary/Chest: Effort normal and breath sounds normal. She has no wheezes.  Abdominal: Soft. There is no tenderness.  Musculoskeletal: Normal range of motion. She exhibits tenderness. She exhibits no deformity.  Slow but normal gait.  Decreased lumbar spine ROM secondary to pain. Lumbar spine and sacral spine midline  tenderness.  SI joints and sciatic notch tender bilaterally.  No CT spine midline tenderness.  No CTL paraspinal tenderness or increased tone. Full passive hip, knee and ankle ROM bilaterally.  Negative SLR. Negative Faber. Negative Stinchfield test.  Lymphadenopathy:    She has no cervical adenopathy.  Neurological: She is alert and oriented to person, place, and time.  5/5 strength with hip flexion,  knee flexion/extension and ankle flexion/extension. Sensation to light touch intact in lower extremities.   Skin: Skin is warm and dry. Capillary refill takes less than 2 seconds.  No ecchymosis in buttocks or lumbar spine.  Psychiatric: She has a normal mood and affect. Her behavior is normal. Judgment and thought content normal.  Nursing note and vitals reviewed.    ED Treatments / Results  DIAGNOSTIC STUDIES: Oxygen Saturation is 100% on RA, normal by my interpretation.   COORDINATION OF CARE: 6:03 PM- Will X-Ray lumbar spine and order Percocet for pain. Pt verbalizes understanding and agrees to plan.  Medications  ibuprofen (ADVIL,MOTRIN) tablet 600 mg (600 mg Oral Refused 08/24/16 1959)  oxyCODONE-acetaminophen (PERCOCET/ROXICET) 5-325 MG per tablet 2 tablet (2 tablets Oral Given 08/24/16 1817)  ondansetron (ZOFRAN-ODT) disintegrating tablet 4 mg (4 mg Oral Given 08/24/16 1816)    Labs (all labs ordered are listed, but only abnormal results are displayed) Labs Reviewed - No data to display  EKG  EKG Interpretation None       Radiology Dg Lumbar Spine Complete  Result Date: 08/24/2016 CLINICAL DATA:  Worsening lower back pain after falling down stairs 3 days ago at home. EXAM: LUMBAR SPINE - COMPLETE 4+ VIEW COMPARISON:  None. FINDINGS: The lumbar vertebrae are normal in height. Irregularity at the pars interarticularis at L4 on the left, unusual for an acute fracture but it was not visible on the CT of 04/03/2016. Facet articulations appear intact. No spondylolisthesis.  IMPRESSION: Vertebral column is intact. Cannot exclude a pars fracture at L4 on the left. CT would be definitive. Electronically Signed   By: Andreas Newport M.D.   On: 08/24/2016 18:44   Dg Sacrum/coccyx  Result Date: 08/24/2016 CLINICAL DATA:  Fall down stairs 3 days ago with persistent low back pain, initial encounter EXAM: SACRUM AND COCCYX - 2+ VIEW COMPARISON:  None. FINDINGS: Pelvic ring is intact. No acute fracture is seen. No soft tissue abnormality is noted. IMPRESSION: No acute abnormality seen. Electronically Signed   By: Inez Catalina M.D.   On: 08/24/2016 18:41   Ct Lumbar Spine Wo Contrast  Result Date: 08/24/2016 CLINICAL DATA:  Worsening low back pain after fall down stairs 3 days ago. Evaluate for possible pars fracture seen on plain radiographs. EXAM: CT LUMBAR SPINE WITHOUT CONTRAST TECHNIQUE: Multidetector CT imaging of the lumbar spine was performed without intravenous contrast administration. Multiplanar CT image reconstructions were also generated. COMPARISON:  Lumbar spine films today as well as CT 04/03/2016 . FINDINGS: Segmentation: Within normal. Alignment: Within normal. Vertebrae: Mild spondylosis throughout the lumbar spine. Vertebral body heights are maintained. Facet arthropathy most prominent over the lower lumbar spine. No evidence of spondylolysis. Paraspinal and other soft tissues: Within normal. Disc levels: Mild broad-based disc bulge at the L2-3 level without disc herniation, canal stenosis or neural foraminal narrowing. Minimal broad-based disc bulge at the L3-4 level without focal disc herniation or neural foraminal narrowing. No significant canal stenosis. Mild broad-based disc bulge at the L4-5 level without disc herniation. No significant canal stenosis. No significant neural foraminal narrowing. Minimal broad-based disc bulge at the L5-S1 level without disc herniation or canal stenosis. No significant neural foraminal narrowing. IMPRESSION: Mild spondylosis of  the lumbar spine with mild multilevel disc disease. No definite disc herniation or canal stenosis. No significant neural foraminal narrowing. No evidence of spondylolysis. Electronically Signed   By: Marin Olp M.D.   On: 08/24/2016 20:44    Procedures Procedures (including critical care time)  Medications  Ordered in ED Medications  ibuprofen (ADVIL,MOTRIN) tablet 600 mg (600 mg Oral Refused 08/24/16 1959)  oxyCODONE-acetaminophen (PERCOCET/ROXICET) 5-325 MG per tablet 2 tablet (2 tablets Oral Given 08/24/16 1817)  ondansetron (ZOFRAN-ODT) disintegrating tablet 4 mg (4 mg Oral Given 08/24/16 1816)     Initial Impression / Assessment and Plan / ED Course  I have reviewed the triage vital signs and the nursing notes.  Pertinent labs & imaging results that were available during my care of the patient were reviewed by me and considered in my medical decision making (see chart for details).  Clinical Course as of Aug 25 2047  Nancy Fetter Aug 24, 2016  2028 IMPRESSION: Vertebral column is intact. Cannot exclude a pars fracture at L4 on the left. CT would be definitive.  DG Lumbar Spine Complete [CG]  2029 Normal DG Sacrum/Coccyx [CG]  2048 IMPRESSION: Mild spondylosis of the lumbar spine with mild multilevel disc disease. No definite disc herniation or canal stenosis. No significant neural foraminal narrowing. No evidence of spondylolysis.  CT Lumbar Spine Wo Contrast [CG]    Clinical Course User Index [CG] Kinnie Feil, PA-C    66 yo female with pertinent pmh of chronic back pain (daily percocet) presents reporting low back pain and sacral pain s/p mechanical fall down flight of steps landing on buttocks two days ago.  Pt states pain was tolerable yesterday but states pain got worse this morning.  No head trauma, no LOC, no anticoagulants.  Pt has been ambulating at home with some pain.  Exam remarkable for lumbar and sacral midline tenderness, decreased lumbar spine ROM secondary to pain.  Neuro exam normal.  Given ability to ambulate for the last two days x-rays were ordered. Sacrum/coccyx films negative, lumbar spine films unable to r/o pars fracture at L4.  Given age and high risk fall mechanism CT scan lumbar spine ordered to r/o fracture.   Pt received zofran and percocet in ED for pain. I reviewed [place] narcotic database which showed patient gets dispensed percocet monthly by Dr. Nelva Bush.   2048: CT scan lumbar spine negative for new osseous injuries. Will discuss results with patient and plan to discharge with muscle relaxer, naproxen.  No narcotic pain med prescription as she regularly gets percocet by other provider. Will give ortho f/u for long term management of low back pain.   I personally performed the services described in this documentation, which was scribed in my presence. The recorded information has been reviewed and is accurate.   Final Clinical Impressions(s) / ED Diagnoses   Final diagnoses:  Fall, initial encounter  Acute bilateral low back pain without sciatica  Chronic bilateral low back pain without sciatica    New Prescriptions New Prescriptions   No medications on file     Arlean Hopping 08/24/16 2049    Milton Ferguson, MD 08/24/16 2239

## 2016-08-24 NOTE — ED Notes (Signed)
Pt stated "is there not something else for pain?"  Informed will speak to provider.  Spoke with Rosemarie Ax, PA-C & informed of pt's request.  Ibuprofen ordered, pt refused.  Questioned pt if there was something else non-narcotic she would like.  Pt stated "no".

## 2016-08-24 NOTE — Discharge Instructions (Signed)
Fortunately the imaging we did in the emergency department did not show any new changes or injuries in your lumbar spine or sacral/coccyx area. The pain you are experiencing is likely due to the mechanical trauma from your fall and will likely subside with ibuprofen or tylenol.  Please continue taking your percocet as prescribed, this may help with the pain as well.   Place heating pad or ice over painful area to decrease inflammation.  Please follow up with your orthopedist and/or physical therapist for long term treatment of your low back pain.

## 2016-08-24 NOTE — ED Notes (Signed)
Patient transported to X-ray 

## 2016-08-25 ENCOUNTER — Encounter (HOSPITAL_COMMUNITY): Payer: Self-pay | Admitting: Emergency Medicine

## 2016-08-25 ENCOUNTER — Emergency Department (HOSPITAL_COMMUNITY): Payer: PPO

## 2016-08-25 DIAGNOSIS — F17210 Nicotine dependence, cigarettes, uncomplicated: Secondary | ICD-10-CM | POA: Diagnosis not present

## 2016-08-25 DIAGNOSIS — Z79899 Other long term (current) drug therapy: Secondary | ICD-10-CM | POA: Diagnosis not present

## 2016-08-25 DIAGNOSIS — I2510 Atherosclerotic heart disease of native coronary artery without angina pectoris: Secondary | ICD-10-CM | POA: Diagnosis not present

## 2016-08-25 DIAGNOSIS — Z8673 Personal history of transient ischemic attack (TIA), and cerebral infarction without residual deficits: Secondary | ICD-10-CM | POA: Insufficient documentation

## 2016-08-25 DIAGNOSIS — R51 Headache: Secondary | ICD-10-CM | POA: Diagnosis not present

## 2016-08-25 DIAGNOSIS — Z7982 Long term (current) use of aspirin: Secondary | ICD-10-CM | POA: Insufficient documentation

## 2016-08-25 DIAGNOSIS — I252 Old myocardial infarction: Secondary | ICD-10-CM | POA: Diagnosis not present

## 2016-08-25 DIAGNOSIS — R531 Weakness: Secondary | ICD-10-CM | POA: Diagnosis not present

## 2016-08-25 DIAGNOSIS — R791 Abnormal coagulation profile: Secondary | ICD-10-CM | POA: Insufficient documentation

## 2016-08-25 LAB — CBC
HEMATOCRIT: 41.8 % (ref 36.0–46.0)
Hemoglobin: 13.2 g/dL (ref 12.0–15.0)
MCH: 27.4 pg (ref 26.0–34.0)
MCHC: 31.6 g/dL (ref 30.0–36.0)
MCV: 86.9 fL (ref 78.0–100.0)
Platelets: 254 10*3/uL (ref 150–400)
RBC: 4.81 MIL/uL (ref 3.87–5.11)
RDW: 14.1 % (ref 11.5–15.5)
WBC: 5 10*3/uL (ref 4.0–10.5)

## 2016-08-25 LAB — DIFFERENTIAL
Basophils Absolute: 0 10*3/uL (ref 0.0–0.1)
Basophils Relative: 0 %
Eosinophils Absolute: 0.7 10*3/uL (ref 0.0–0.7)
Eosinophils Relative: 13 %
LYMPHS PCT: 29 %
Lymphs Abs: 1.5 10*3/uL (ref 0.7–4.0)
MONO ABS: 0.4 10*3/uL (ref 0.1–1.0)
MONOS PCT: 7 %
Neutro Abs: 2.5 10*3/uL (ref 1.7–7.7)
Neutrophils Relative %: 51 %

## 2016-08-25 LAB — I-STAT CHEM 8, ED
BUN: 14 mg/dL (ref 6–20)
CALCIUM ION: 1.2 mmol/L (ref 1.15–1.40)
Chloride: 103 mmol/L (ref 101–111)
Creatinine, Ser: 0.8 mg/dL (ref 0.44–1.00)
GLUCOSE: 92 mg/dL (ref 65–99)
HCT: 43 % (ref 36.0–46.0)
Hemoglobin: 14.6 g/dL (ref 12.0–15.0)
Potassium: 4.5 mmol/L (ref 3.5–5.1)
Sodium: 144 mmol/L (ref 135–145)
TCO2: 28 mmol/L (ref 0–100)

## 2016-08-25 LAB — COMPREHENSIVE METABOLIC PANEL
ALK PHOS: 110 U/L (ref 38–126)
ALT: 17 U/L (ref 14–54)
AST: 15 U/L (ref 15–41)
Albumin: 4 g/dL (ref 3.5–5.0)
Anion gap: 7 (ref 5–15)
BILIRUBIN TOTAL: 0.5 mg/dL (ref 0.3–1.2)
BUN: 12 mg/dL (ref 6–20)
CALCIUM: 9.7 mg/dL (ref 8.9–10.3)
CO2: 27 mmol/L (ref 22–32)
CREATININE: 0.75 mg/dL (ref 0.44–1.00)
Chloride: 106 mmol/L (ref 101–111)
Glucose, Bld: 92 mg/dL (ref 65–99)
Potassium: 4.8 mmol/L (ref 3.5–5.1)
Sodium: 140 mmol/L (ref 135–145)
Total Protein: 7.3 g/dL (ref 6.5–8.1)

## 2016-08-25 LAB — I-STAT TROPONIN, ED: TROPONIN I, POC: 0 ng/mL (ref 0.00–0.08)

## 2016-08-25 LAB — PROTIME-INR
INR: 0.98
Prothrombin Time: 13 seconds (ref 11.4–15.2)

## 2016-08-25 LAB — APTT: aPTT: 25 seconds (ref 24–36)

## 2016-08-25 NOTE — ED Triage Notes (Signed)
Per [facility]: Pt to ED from home c/o bilateral leg weakness since this morning. Had unilateral L leg weakness yesterday and was seen and d/c from [facility] yesterday. Pt reports falling down about 20 stairs last Thursday landing on her bottom with each step. Occasionally walks with cane, states she is unable to ambulate at all on her own today. No other stroke symptoms noted, but pt does report CVA without deficits in 2014. Hx of hemiplegic migraines and ongoing memory issues since last year, in which she has not been evaluated for yet. EMS VS: 114/76, HR 62 NSR, 96% RA, CBG 134. Pt c/o pain in coccyx area from the fall last week. Pt A&O x 4.

## 2016-08-26 ENCOUNTER — Emergency Department (HOSPITAL_COMMUNITY)
Admission: EM | Admit: 2016-08-26 | Discharge: 2016-08-26 | Disposition: A | Discharge status: Home / Self Care | Payer: PPO | Attending: Emergency Medicine | Admitting: Emergency Medicine

## 2016-08-26 DIAGNOSIS — R531 Weakness: Secondary | ICD-10-CM

## 2016-08-26 LAB — CBG MONITORING, ED: Glucose-Capillary: 93 mg/dL (ref 65–99)

## 2016-08-26 MED ORDER — KETOROLAC TROMETHAMINE 30 MG/ML IJ SOLN
30.0000 mg | Freq: Once | INTRAMUSCULAR | Status: AC
  Administered 2016-08-26: 30 mg via INTRAVENOUS
  Filled 2016-08-26: qty 1

## 2016-08-26 MED ORDER — METOCLOPRAMIDE HCL 5 MG/ML IJ SOLN
10.0000 mg | Freq: Once | INTRAMUSCULAR | Status: AC
Start: 2016-08-26 — End: 2016-08-26
  Administered 2016-08-26: 10 mg via INTRAVENOUS
  Filled 2016-08-26: qty 2

## 2016-08-26 MED ORDER — DIPHENHYDRAMINE HCL 50 MG/ML IJ SOLN
50.0000 mg | Freq: Once | INTRAMUSCULAR | Status: AC
  Administered 2016-08-26: 50 mg via INTRAVENOUS
  Filled 2016-08-26: qty 1

## 2016-08-26 NOTE — ED Provider Notes (Signed)
[facility] DEPT Provider Note   CSN: [id number] Arrival date & time: 08/25/16  1732   By signing my name below, I, Dorothy White, attest that this documentation has been prepared under the direction and in the presence of Everlene Balls, MD. Electronically signed, Dorothy White, ED Scribe. 08/26/16. 1:49 AM.   History   Chief Complaint Chief Complaint  Patient presents with  . Extremity Weakness   The history is provided by the patient and medical records. No language interpreter was used.    HPI Comments: [name] is a 66 y.o. female BIB EMS who presents to the Emergency Department complaining of bilateral lower extremity weakness x < 24 hours. Pt states left leg weakness began ~3-4 days ago, and lower extremity weakness became bilateral today. She reports increased difficulty ambulating, aggravated low back pain and intermittent, worsening headaches today. Pt notes she fell multiple times at home today d/t leg weakness with no head trauma or LOC, and she states she has been unable to walk, even with the assistance of her cane at home today. Hx of CAD, HLD, hemiplegic migraines and chronic back pain noted. She notes she fell down a flight of stairs ~5 days ago, and she elicits she was seen for back pain related to the fall on 08/24/2016 in [facility] ED. She details that she collided on her buttocks with ~20 stairs during the initial fall ~5-6 days ago, and she reported on-radiating low back/tail bone pain during her prior ED visit. Prior note reveals the pt was given percocet and Zofran during the aforementioned visit with adequate relief to her symptoms at the time. Prior records indicate she takes percocet prescribed by Dr. Nelva Bush and OTC ASA PRN at home for chronic back pain. Pt denies incontinence, N/V/D or any other neurological symptoms at this time.  Past Medical History:  Diagnosis Date  . Anemia    ?  Marland Kitchen Anginal pain ([place])    last few months-mild CAD 01/2016 cath  . Anxiety   .  Arrhythmia   . Arthritis    "everywhere" (04/25/2016)  . 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 Chronic back pain    "all over" (04/25/2016)  . Constipation   . Coronary artery disease   . Depression   . Diverticulitis   . GERD (gastroesophageal reflux disease)   . Headache    "weekly" (04/25/2016)  . HLD (hyperlipidemia)    hx (04/25/2016)  . 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 Myocardial infarction    "found it when I did stress test"  . Stroke [facility]) 06/2013   "mini" stroke , ?possibly hemaplegic migraine per pt    Patient Active Problem List   Diagnosis Date Noted  . Diverticulitis of sigmoid colon 04/25/2016  . Infection due to ESBL-producing Escherichia coli/Diverticular Abscess 03/15/2016  . Diverticulitis 03/12/2016  . Chronic pain syndrome 03/12/2016  . Lesion of pancreas 03/12/2016  . History of chest pain 03/12/2016  . Hypokalemia 03/12/2016  . 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 TIA (transient ischemic attack) 12/26/2015  . DOE (dyspnea on exertion) 12/26/2015  . Right sided weakness 12/17/2015  . Ataxia 12/17/2015  . Shoulder fracture 02/15/2015  . Chronic headache 10/11/2013  . Weakness generalized 08/12/2013  . Dizziness and giddiness 08/12/2013  . Abnormality of gait 07/11/2013  . Hyperlipidemia 05/07/2007  .  COMMON MIGRAINE 05/07/2007  . PREMATURE VENTRICULAR CONTRACTIONS, FREQUENT 05/07/2007  . GERD 05/07/2007  . DIVERTICULOSIS, COLON 05/07/2007  . 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 OSTEOPENIA 05/07/2007  . Anxiety and depression 03/24/2007  . HEMORRHOIDS 03/24/2007  . ALLERGIC RHINITIS 03/24/2007  . LOW BACK PAIN 03/24/2007  . MIGRAINES, HX OF 03/24/2007    Past Surgical History:  Procedure Laterality Date  . AUGMENTATION MAMMAPLASTY  1980  . BASAL CELL CARCINOMA EXCISION     "tip of  my nose"  . BUNIONECTOMY Bilateral 10/2003  . CARDIAC CATHETERIZATION  02/20/2016  . CARDIAC CATHETERIZATION N/A 02/20/2016   Procedure: Left Heart Cath and Coronary Angiography;  Surgeon: Jettie Booze, MD;  Location: [facility];  Service: Cardiovascular;  Laterality: N/A;  . COLONOSCOPY    . DILATION AND CURETTAGE OF UTERUS    . FRACTURE SURGERY    . IR GENERIC HISTORICAL  03/18/2016   IR SINUS/FIST TUBE CHK-NON GI 03/18/2016 Aletta Edouard, MD [facility]-INTERV RAD  . LAPAROSCOPIC SIGMOID COLECTOMY  04/25/2016  . LAPAROSCOPIC SIGMOID COLECTOMY N/A 04/25/2016   Procedure: LAPAROSCOPIC SIGMOID COLECTOMY;  Surgeon: Stark Klein, MD;  Location: [facility];  Service: General;  Laterality: N/A;  . OOPHORECTOMY Bilateral ~ 1999  . ORIF HUMERUS FRACTURE Right 02/15/2015   Procedure: OPEN REDUCTION INTERNAL FIXATION (ORIF) PROXIMAL HUMERUS FRACTURE;  Surgeon: Netta Cedars, MD;  Location: [facility];  Service: Orthopedics;  Laterality: Right;  . RHINOPLASTY  1976  . TONSILLECTOMY    . TUBAL LIGATION  ~ 1983  . VAGINAL HYSTERECTOMY  ~ 1998    OB History    No data available       Home Medications    Prior to Admission medications   Medication Sig Start Date End Date Taking? Authorizing Provider  ARTIFICIAL TEAR OP Apply 1 drop to eye daily as needed (dry eyes).    Historical Provider, MD  Ascorbic Acid (VITAMIN C PO) Take 1 tablet by mouth every evening.    Historical Provider, MD  aspirin EC 81 MG tablet Take 81 mg by mouth every other day.    Historical Provider, MD  busPIRone (BUSPAR) 5 MG tablet Take 1 tablet by mouth 3 (three) times daily. 02/12/16   Historical Provider, MD  calcium-vitamin D (OSCAL WITH D) 500-200 MG-UNIT per tablet Take 1 tablet by mouth daily with breakfast.    Historical Provider, MD  cetirizine (ZYRTEC) 10 MG tablet Take 10 mg by mouth daily.    Historical Provider, MD  Cholecalciferol (VITAMIN D PO) Take 1 capsule by mouth every evening.    Historical Provider, MD    clonazePAM (KLONOPIN) 1 MG tablet Take 1 tablet (1 mg total) by mouth daily as needed (anxiety/sleep.). Patient taking differently: Take 1 mg by mouth 2 (two) times daily as needed for anxiety (anxiety/sleep.).  02/15/16   Dorothy Spark, MD  FLUoxetine (PROZAC) 20 MG capsule Take 60 mg by mouth daily.     Historical Provider, MD  ibuprofen (ADVIL,MOTRIN) 200 MG tablet Take 600-800 mg by mouth every 6 (six) hours as needed for mild pain.    Historical Provider, MD  LUTEIN PO Take 1 capsule by mouth every evening.    Historical Provider, MD  mometasone (NASONEX) 50 MCG/ACT nasal spray Place 2 sprays into the nose daily.    Historical Provider, MD  Multiple Vitamins-Minerals (ONE-A-DAY WOMENS 50 PLUS PO) Take 1 tablet by mouth every evening.    Historical Provider, MD  nystatin (MYCOSTATIN) 100000 UNIT/ML suspension Take  5 mLs (500,000 Units total) by mouth 4 (four) times daily. Patient taking differently: Take 5 mLs by mouth daily as needed (flush).  04/01/16   Amy S Esterwood, PA-C  oxyCODONE-acetaminophen (PERCOCET/ROXICET) 5-325 MG tablet Take 1-2 tablets by mouth every 4 (four) hours as needed for moderate pain or severe pain. 04/30/16   [name], PA-C  RELPAX 40 MG tablet Take 1 tablet by mouth as needed for migraine and may take 1 additional tablet in 2 hours if no relief 01/16/16   Historical Provider, MD  traZODone (DESYREL) 100 MG tablet Take 200 mg by mouth at bedtime.     Historical Provider, MD  vitamin E 100 UNIT capsule Take 100 Units by mouth every evening.    Historical Provider, MD    Family History Family History  Problem Relation Age of Onset  . Lung cancer Mother 76  . Heart attack Father 1  . Hypertension Brother   . Esophageal cancer Brother   . Diabetes Paternal Grandmother     questionable  . Colon cancer Neg Hx   . Kidney disease Neg Hx   . Liver disease Neg Hx     Social History Social History  Substance Use Topics  . Smoking status: Current Some Day  Smoker    Years: 34.00    Types: Cigarettes  . Smokeless tobacco: Never Used     Comment: 04/25/2016 "2 cigarettes/wik sometimes; sometimes nothing for months; real smoker til 2014"  . Alcohol use No     Comment: Denies 08/24/16     Allergies   Penicillins   Review of Systems Review of Systems  All other systems reviewed and are negative.  A complete 10 system review of systems was obtained and all systems are negative except as noted in the HPI and PMH.    Physical Exam Updated Vital Signs BP 130/74 (BP Location: Right Arm)   Pulse 60   Temp 98.1 F (36.7 C) (Oral)   Resp 16   Ht 5\' 5"  (1.651 m)   Wt 130 lb (59 kg)   SpO2 99%   BMI 21.63 kg/m   Physical Exam  Constitutional: She is oriented to person, place, and time. She appears well-developed and well-nourished. No distress.  HENT:  Head: Normocephalic and atraumatic.  Nose: Nose normal.  Mouth/Throat: Oropharynx is clear and moist. No oropharyngeal exudate.  Eyes: Conjunctivae and EOM are normal. Pupils are equal, round, and reactive to light. No scleral icterus.  Neck: Normal range of motion. Neck supple. No JVD present. No tracheal deviation present. No thyromegaly present.  Cardiovascular: Normal rate, regular rhythm and normal heart sounds.  Exam reveals no gallop and no friction rub.   No murmur heard. Pulmonary/Chest: Effort normal and breath sounds normal. No respiratory distress. She has no wheezes. She exhibits no tenderness.  Abdominal: Soft. Bowel sounds are normal. She exhibits no distension and no mass. There is no tenderness. There is no rebound and no guarding.  Musculoskeletal: Normal range of motion. She exhibits no edema or tenderness.  Lymphadenopathy:    She has no cervical adenopathy.  Neurological: She is alert and oriented to person, place, and time. No cranial nerve deficit. She exhibits normal muscle tone.  NL strength in all extremities; NL cerebellar testing.  Skin: Skin is warm and dry.  No rash noted. No erythema. No pallor.  Nursing note and vitals reviewed.    ED Treatments / Results  DIAGNOSTIC STUDIES: Oxygen Saturation is 99% on RA, normal by  my interpretation.    COORDINATION OF CARE: 1:38 AM Discussed treatment plan with pt at bedside and pt agreed to plan. Will order medications and reassess.  Labs (all labs ordered are listed, but only abnormal results are displayed) Labs Reviewed  PROTIME-INR  APTT  CBC  DIFFERENTIAL  COMPREHENSIVE METABOLIC PANEL  I-STAT [place], ED  CBG MONITORING, ED  I-STAT CHEM 8, ED    EKG  EKG Interpretation None       Radiology Dg Lumbar Spine Complete  Result Date: 08/24/2016 CLINICAL DATA:  Worsening lower back pain after falling down stairs 3 days ago at home. EXAM: LUMBAR SPINE - COMPLETE 4+ VIEW COMPARISON:  None. FINDINGS: The lumbar vertebrae are normal in height. Irregularity at the pars interarticularis at L4 on the left, unusual for an acute fracture but it was not visible on the CT of 04/03/2016. Facet articulations appear intact. No spondylolisthesis. IMPRESSION: Vertebral column is intact. Cannot exclude a pars fracture at L4 on the left. CT would be definitive. Electronically Signed   By: Andreas Newport M.D.   On: 08/24/2016 18:44   Dg Sacrum/coccyx  Result Date: 08/24/2016 CLINICAL DATA:  Fall down stairs 3 days ago with persistent low back pain, initial encounter EXAM: SACRUM AND COCCYX - 2+ VIEW COMPARISON:  None. FINDINGS: Pelvic ring is intact. No acute fracture is seen. No soft tissue abnormality is noted. IMPRESSION: No acute abnormality seen. Electronically Signed   By: Inez Catalina M.D.   On: 08/24/2016 18:41   Ct Head Wo Contrast  Result Date: 08/25/2016 CLINICAL DATA:  Bilateral leg weakness.  Symptoms for 1 day. EXAM: CT HEAD WITHOUT CONTRAST TECHNIQUE: Contiguous axial images were obtained from the base of the skull through the vertex without intravenous contrast. COMPARISON:  12/17/2015  FINDINGS: Brain: No mass effect, midline shift, or acute hemorrhage. Ventricular system and extra-axial space are within normal limits. Vascular: No hyperdense vessel or unexpected calcification. Skull: Cranium is intact. Sinuses/Orbits: Mastoid air cells are clear. Mucosal thickening in the ethmoid air cells. There is some mucosal thickening in the medial left maxillary sinus. Sphenoid sinuses and frontal sinuses are clear. Other: Stable nasal fracture. IMPRESSION: No acute intracranial pathology. Electronically Signed   By: Marybelle Killings M.D.   On: 08/25/2016 19:34   Ct Lumbar Spine Wo Contrast  Result Date: 08/24/2016 CLINICAL DATA:  Worsening low back pain after fall down stairs 3 days ago. Evaluate for possible pars fracture seen on plain radiographs. EXAM: CT LUMBAR SPINE WITHOUT CONTRAST TECHNIQUE: Multidetector CT imaging of the lumbar spine was performed without intravenous contrast administration. Multiplanar CT image reconstructions were also generated. COMPARISON:  Lumbar spine films today as well as CT 04/03/2016 . FINDINGS: Segmentation: Within normal. Alignment: Within normal. Vertebrae: Mild spondylosis throughout the lumbar spine. Vertebral body heights are maintained. Facet arthropathy most prominent over the lower lumbar spine. No evidence of spondylolysis. Paraspinal and other soft tissues: Within normal. Disc levels: Mild broad-based disc bulge at the L2-3 level without disc herniation, canal stenosis or neural foraminal narrowing. Minimal broad-based disc bulge at the L3-4 level without focal disc herniation or neural foraminal narrowing. No significant canal stenosis. Mild broad-based disc bulge at the L4-5 level without disc herniation. No significant canal stenosis. No significant neural foraminal narrowing. Minimal broad-based disc bulge at the L5-S1 level without disc herniation or canal stenosis. No significant neural foraminal narrowing. IMPRESSION: Mild spondylosis of the lumbar  spine with mild multilevel disc disease. No definite disc herniation or canal stenosis. No  significant neural foraminal narrowing. No evidence of spondylolysis. Electronically Signed   By: Marin Olp M.D.   On: 08/24/2016 20:44    Procedures Procedures (including critical care time)  Medications Ordered in ED Medications  ketorolac (TORADOL) 30 MG/ML injection 30 mg (30 mg Intravenous Given 08/26/16 0145)  diphenhydrAMINE (BENADRYL) injection 50 mg (50 mg Intravenous Given 08/26/16 0145)  metoCLOPramide (REGLAN) injection 10 mg (10 mg Intravenous Given 08/26/16 0145)     Initial Impression / Assessment and Plan / ED Course  I have reviewed the triage vital signs and the nursing notes.  Pertinent labs & imaging results that were available during my care of the patient were reviewed by me and considered in my medical decision making (see chart for details).  Patient presents to the ED for worsening weakness in the BLE.  She also has a h/o hemiplegic migraines.  On exam her strength is good bilaterally.  Plan to treat migraine and re assess.  History is not consistent with central process.  Will observe in the ED.   Patient feels better after medication.  Labs normal. Was just seen for fall and images were negative.  She was given toradol, reglan, and benadryl in the ED. Plan to Dc with PCP fu for further care. She appears well and in NAD. VS remain within her normal limits and she is safe for Dc.   I personally performed the services described in this documentation, which was scribed in my presence. The recorded information has been reviewed and is accurate.     Final Clinical Impressions(s) / ED Diagnoses   Final diagnoses:  None    New Prescriptions New Prescriptions   No medications on file     Everlene Balls, MD 08/26/16 0330

## 2016-08-26 NOTE — ED Notes (Signed)
EDP at bedside  

## 2016-08-26 NOTE — ED Notes (Addendum)
Pt presents today with a headache and leg weakness that started 2 days ago. Pt states she gets migraines but has never had any leg weakness before this episode.

## 2016-09-03 DIAGNOSIS — G8929 Other chronic pain: Secondary | ICD-10-CM | POA: Diagnosis not present

## 2016-09-03 DIAGNOSIS — M5442 Lumbago with sciatica, left side: Secondary | ICD-10-CM | POA: Diagnosis not present

## 2016-09-03 DIAGNOSIS — Z79891 Long term (current) use of opiate analgesic: Secondary | ICD-10-CM | POA: Diagnosis not present

## 2016-09-26 ENCOUNTER — Encounter (HOSPITAL_COMMUNITY): Payer: Self-pay | Admitting: Physician Assistant

## 2016-09-26 ENCOUNTER — Observation Stay (HOSPITAL_COMMUNITY)
Admission: EM | Admit: 2016-09-26 | Discharge: 2016-09-27 | Disposition: A | Discharge status: Home / Self Care | Payer: PPO | Attending: Internal Medicine | Admitting: Internal Medicine

## 2016-09-26 ENCOUNTER — Emergency Department (HOSPITAL_COMMUNITY): Payer: PPO

## 2016-09-26 DIAGNOSIS — R0602 Shortness of breath: Secondary | ICD-10-CM | POA: Diagnosis not present

## 2016-09-26 DIAGNOSIS — K219 Gastro-esophageal reflux disease without esophagitis: Secondary | ICD-10-CM

## 2016-09-26 DIAGNOSIS — Z8673 Personal history of transient ischemic attack (TIA), and cerebral infarction without residual deficits: Secondary | ICD-10-CM | POA: Insufficient documentation

## 2016-09-26 DIAGNOSIS — I252 Old myocardial infarction: Secondary | ICD-10-CM | POA: Diagnosis not present

## 2016-09-26 DIAGNOSIS — R0789 Other chest pain: Secondary | ICD-10-CM | POA: Diagnosis not present

## 2016-09-26 DIAGNOSIS — E785 Hyperlipidemia, unspecified: Secondary | ICD-10-CM | POA: Diagnosis present

## 2016-09-26 DIAGNOSIS — I4581 Long QT syndrome: Secondary | ICD-10-CM

## 2016-09-26 DIAGNOSIS — I2510 Atherosclerotic heart disease of native coronary artery without angina pectoris: Secondary | ICD-10-CM | POA: Insufficient documentation

## 2016-09-26 DIAGNOSIS — F17210 Nicotine dependence, cigarettes, uncomplicated: Secondary | ICD-10-CM | POA: Diagnosis not present

## 2016-09-26 DIAGNOSIS — Z79899 Other long term (current) drug therapy: Secondary | ICD-10-CM | POA: Insufficient documentation

## 2016-09-26 DIAGNOSIS — Z7982 Long term (current) use of aspirin: Secondary | ICD-10-CM | POA: Insufficient documentation

## 2016-09-26 DIAGNOSIS — R002 Palpitations: Secondary | ICD-10-CM | POA: Diagnosis not present

## 2016-09-26 DIAGNOSIS — R079 Chest pain, unspecified: Secondary | ICD-10-CM

## 2016-09-26 HISTORY — DX: Other cardiomyopathies: I42.8

## 2016-09-26 HISTORY — DX: Atherosclerotic heart disease of native coronary artery without angina pectoris: I25.10

## 2016-09-26 LAB — CBC
HEMATOCRIT: 42.7 % (ref 36.0–46.0)
HEMATOCRIT: 39.9 % (ref 36.0–46.0)
HEMOGLOBIN: 12.8 g/dL (ref 12.0–15.0)
Hemoglobin: 13.9 g/dL (ref 12.0–15.0)
MCH: 28 pg (ref 26.0–34.0)
MCH: 27.5 pg (ref 26.0–34.0)
MCHC: 32.6 g/dL (ref 30.0–36.0)
MCHC: 32.1 g/dL (ref 30.0–36.0)
MCV: 85.9 fL (ref 78.0–100.0)
MCV: 85.8 fL (ref 78.0–100.0)
Platelets: 286 10*3/uL (ref 150–400)
Platelets: 272 10*3/uL (ref 150–400)
RBC: 4.97 MIL/uL (ref 3.87–5.11)
RBC: 4.65 MIL/uL (ref 3.87–5.11)
RDW: 13.5 % (ref 11.5–15.5)
RDW: 13.5 % (ref 11.5–15.5)
WBC: 6 10*3/uL (ref 4.0–10.5)
WBC: 5.8 10*3/uL (ref 4.0–10.5)

## 2016-09-26 LAB — BASIC METABOLIC PANEL
ANION GAP: 9 (ref 5–15)
BUN: 13 mg/dL (ref 6–20)
CO2: 22 mmol/L (ref 22–32)
Calcium: 9.8 mg/dL (ref 8.9–10.3)
Chloride: 107 mmol/L (ref 101–111)
Creatinine, Ser: 0.71 mg/dL (ref 0.44–1.00)
Glucose, Bld: 90 mg/dL (ref 65–99)
POTASSIUM: 4.2 mmol/L (ref 3.5–5.1)
Sodium: 138 mmol/L (ref 135–145)

## 2016-09-26 LAB — TROPONIN I

## 2016-09-26 LAB — CREATININE, SERUM: CREATININE: 0.95 mg/dL (ref 0.44–1.00)

## 2016-09-26 LAB — BRAIN NATRIURETIC PEPTIDE: B NATRIURETIC PEPTIDE 5: 8.9 pg/mL (ref 0.0–100.0)

## 2016-09-26 LAB — D-DIMER, QUANTITATIVE (NOT AT ARMC): D DIMER QUANT: 0.49 ug{FEU}/mL (ref 0.00–0.50)

## 2016-09-26 LAB — I-STAT TROPONIN, ED: Troponin i, poc: 0 ng/mL (ref 0.00–0.08)

## 2016-09-26 LAB — MAGNESIUM: MAGNESIUM: 1.8 mg/dL (ref 1.7–2.4)

## 2016-09-26 LAB — TSH: TSH: 0.756 u[IU]/mL (ref 0.350–4.500)

## 2016-09-26 MED ORDER — ACETAMINOPHEN 325 MG PO TABS: 650.0000 mg | ORAL_TABLET | ORAL | Status: DC | PRN

## 2016-09-26 MED ORDER — NICOTINE 21 MG/24HR TD PT24
21.0000 mg | MEDICATED_PATCH | Freq: Every day | TRANSDERMAL | Status: DC
  Filled 2016-09-26: qty 1

## 2016-09-26 MED ORDER — KETOROLAC TROMETHAMINE 30 MG/ML IJ SOLN
30.0000 mg | Freq: Once | INTRAMUSCULAR | Status: AC
  Administered 2016-09-26: 30 mg via INTRAVENOUS
  Filled 2016-09-26: qty 1

## 2016-09-26 MED ORDER — METOCLOPRAMIDE HCL 5 MG/ML IJ SOLN
10.0000 mg | Freq: Once | INTRAMUSCULAR | Status: AC
  Administered 2016-09-26: 10 mg via INTRAVENOUS
  Filled 2016-09-26: qty 2

## 2016-09-26 MED ORDER — ALBUTEROL SULFATE (2.5 MG/3ML) 0.083% IN NEBU: 2.5000 mg | INHALATION_SOLUTION | RESPIRATORY_TRACT | Status: DC | PRN

## 2016-09-26 MED ORDER — LORATADINE 10 MG PO TABS
10.0000 mg | ORAL_TABLET | Freq: Every evening | ORAL | Status: DC
  Administered 2016-09-26: 10 mg via ORAL
  Filled 2016-09-26: qty 1

## 2016-09-26 MED ORDER — FLUTICASONE PROPIONATE 50 MCG/ACT NA SUSP
1.0000 | Freq: Every day | NASAL | Status: DC
  Filled 2016-09-26: qty 16

## 2016-09-26 MED ORDER — ENOXAPARIN SODIUM 40 MG/0.4ML ~~LOC~~ SOLN
40.0000 mg | SUBCUTANEOUS | Status: DC
  Administered 2016-09-26: 40 mg via SUBCUTANEOUS
  Filled 2016-09-26: qty 0.4

## 2016-09-26 MED ORDER — PANTOPRAZOLE SODIUM 40 MG PO TBEC
40.0000 mg | DELAYED_RELEASE_TABLET | Freq: Every day | ORAL | Status: DC
  Administered 2016-09-26: 40 mg via ORAL
  Filled 2016-09-26 (×2): qty 1

## 2016-09-26 MED ORDER — OXYCODONE-ACETAMINOPHEN 5-325 MG PO TABS
1.0000 | ORAL_TABLET | ORAL | Status: DC | PRN
  Administered 2016-09-26: 1 via ORAL
  Administered 2016-09-27 (×2): 2 via ORAL
  Filled 2016-09-26 (×2): qty 2
  Filled 2016-09-26: qty 1

## 2016-09-26 MED ORDER — ONDANSETRON HCL 4 MG/2ML IJ SOLN: 4.0000 mg | Freq: Four times a day (QID) | INTRAMUSCULAR | Status: DC | PRN

## 2016-09-26 MED ORDER — DIPHENHYDRAMINE HCL 50 MG/ML IJ SOLN
25.0000 mg | Freq: Once | INTRAMUSCULAR | Status: AC
  Administered 2016-09-26: 25 mg via INTRAVENOUS
  Filled 2016-09-26: qty 1

## 2016-09-26 MED ORDER — CLONAZEPAM 0.5 MG PO TABS: 1.0000 mg | ORAL_TABLET | Freq: Two times a day (BID) | ORAL | Status: DC | PRN

## 2016-09-26 MED ORDER — ASPIRIN EC 81 MG PO TBEC: 81.0000 mg | DELAYED_RELEASE_TABLET | ORAL | Status: DC

## 2016-09-26 MED ORDER — SODIUM CHLORIDE 0.9 % IV SOLN
INTRAVENOUS | Status: DC
  Administered 2016-09-26: 21:00:00 via INTRAVENOUS

## 2016-09-26 MED ORDER — MORPHINE SULFATE (PF) 4 MG/ML IV SOLN
2.0000 mg | Freq: Once | INTRAVENOUS | Status: AC
  Administered 2016-09-26: 2 mg via INTRAVENOUS
  Filled 2016-09-26: qty 1

## 2016-09-26 NOTE — ED Provider Notes (Signed)
[facility] DEPT Provider Note   CSN: [id number] Arrival date & time: 09/26/16  1538     History   Chief Complaint No chief complaint on file.   HPI Dorothy White is a 66 y.o. female.  HPI  66 y.o. female with a hx of CAD, HLD, presents to the Emergency Department today complaining of chest pain with onset today around 1400. Pt states she went to walk-in clinic due to URI symptoms and and feeling fatigued for the past week. Notes while sitting in clinic, she felt chest pressure on left side of chest. Notes pain 3/10 and dull pressure. Constant that is not intermittent. Notes no radiation. Associated nausea and feeling clammy. No SOB/ABD pain. Given zofran in clinic and 324 ASA. Notes mild rhinorrhea as well as dry cough. No fevers. Does have hx of possible MI caught on stress test that a cardiologist told her was an old MI. No stent placements Cath done last year with report listed below. No other symptoms noted.   Cath report 02-20-16  Mid LAD lesion, 25 %stenosed.  The left ventricular systolic function is normal.  LV end diastolic pressure is normal.  The left ventricular ejection fraction is 55-65% by visual estimate.  There is no aortic valve stenosis.  Past Medical History:  Diagnosis Date  . Anemia    ?  Marland Kitchen Anginal pain ([place])    last few months-mild CAD 01/2016 cath  . Anxiety   . Arrhythmia   . Arthritis    "everywhere" (04/25/2016)  . 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 Chronic back pain    "all over" (04/25/2016)  . Constipation   . Coronary artery disease   . Depression   . Diverticulitis   . GERD (gastroesophageal reflux disease)   . Headache    "weekly" (04/25/2016)  . HLD (hyperlipidemia)    hx (04/25/2016)  . 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 Myocardial infarction    "found it when I did stress test"  . 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 Diverticulitis of sigmoid colon 04/25/2016  . Infection due to ESBL-producing Escherichia coli/Diverticular Abscess 03/15/2016  . Diverticulitis 03/12/2016  . Chronic pain syndrome 03/12/2016  . Lesion of pancreas 03/12/2016  . History of chest pain 03/12/2016  . Hypokalemia 03/12/2016  . Angina, class IV ([place]) - chest tightness and pressure with dyspnea with minimal exertion 02/17/2016    Class: Question of  . TIA (transient ischemic attack) 12/26/2015  . DOE (dyspnea on exertion) 12/26/2015  . Right sided weakness 12/17/2015  . Ataxia 12/17/2015  . Shoulder fracture 02/15/2015  . Chronic headache 10/11/2013  . Weakness generalized 08/12/2013  . Dizziness and giddiness 08/12/2013  . Abnormality of gait 07/11/2013  . Hyperlipidemia 05/07/2007  . COMMON MIGRAINE 05/07/2007  . PREMATURE VENTRICULAR CONTRACTIONS, FREQUENT 05/07/2007  . GERD 05/07/2007  . DIVERTICULOSIS, COLON 05/07/2007  . 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 OSTEOPENIA 05/07/2007  . Anxiety and depression 03/24/2007  . HEMORRHOIDS 03/24/2007  . ALLERGIC RHINITIS 03/24/2007  . LOW BACK PAIN 03/24/2007  . MIGRAINES, HX OF 03/24/2007    Past Surgical History:  Procedure Laterality Date  . AUGMENTATION MAMMAPLASTY  1980  . BASAL CELL CARCINOMA EXCISION     "tip of my nose"  . BUNIONECTOMY Bilateral 10/2003  . CARDIAC  CATHETERIZATION  02/20/2016  . CARDIAC CATHETERIZATION N/A 02/20/2016   Procedure: Left Heart Cath and Coronary Angiography;  Surgeon: Jettie Booze, MD;  Location: [facility];  Service: Cardiovascular;  Laterality: N/A;  . COLONOSCOPY    . DILATION AND CURETTAGE OF UTERUS    . FRACTURE SURGERY    . IR GENERIC HISTORICAL  03/18/2016   IR SINUS/FIST TUBE CHK-NON GI 03/18/2016 Aletta Edouard, MD [facility]  . LAPAROSCOPIC SIGMOID COLECTOMY  04/25/2016  . LAPAROSCOPIC SIGMOID COLECTOMY N/A 04/25/2016   Procedure:  LAPAROSCOPIC SIGMOID COLECTOMY;  Surgeon: Stark Klein, MD;  Location: [facility];  Service: General;  Laterality: N/A;  . OOPHORECTOMY Bilateral ~ 1999  . ORIF HUMERUS FRACTURE Right 02/15/2015   Procedure: OPEN REDUCTION INTERNAL FIXATION (ORIF) PROXIMAL HUMERUS FRACTURE;  Surgeon: Netta Cedars, MD;  Location: [facility];  Service: Orthopedics;  Laterality: Right;  . RHINOPLASTY  1976  . TONSILLECTOMY    . TUBAL LIGATION  ~ 1983  . VAGINAL HYSTERECTOMY  ~ 1998    OB History    No data available       Home Medications    Prior to Admission medications   Medication Sig Start Date End Date Taking? Authorizing Provider  ARTIFICIAL TEAR OP Apply 1 drop to eye daily as needed (dry eyes).    Historical Provider, MD  Ascorbic Acid (VITAMIN C PO) Take 1 tablet by mouth every evening.    Historical Provider, MD  aspirin EC 81 MG tablet Take 81 mg by mouth every other day.    Historical Provider, MD  busPIRone (BUSPAR) 5 MG tablet Take 1 tablet by mouth 3 (three) times daily. 02/12/16   Historical Provider, MD  calcium-vitamin D (OSCAL WITH D) 500-200 MG-UNIT per tablet Take 1 tablet by mouth daily with breakfast.    Historical Provider, MD  cetirizine (ZYRTEC) 10 MG tablet Take 10 mg by mouth daily.    Historical Provider, MD  Cholecalciferol (VITAMIN D PO) Take 1 capsule by mouth every evening.    Historical Provider, MD  clonazePAM (KLONOPIN) 1 MG tablet Take 1 tablet (1 mg total) by mouth daily as needed (anxiety/sleep.). Patient taking differently: Take 1 mg by mouth 2 (two) times daily as needed for anxiety (anxiety/sleep.).  02/15/16   Dorothy Spark, MD  FLUoxetine (PROZAC) 20 MG capsule Take 60 mg by mouth daily.     Historical Provider, MD  ibuprofen (ADVIL,MOTRIN) 200 MG tablet Take 600-800 mg by mouth every 6 (six) hours as needed for mild pain.    Historical Provider, MD  LUTEIN PO Take 1 capsule by mouth every evening.    Historical Provider, MD  mometasone (NASONEX) 50 MCG/ACT nasal spray  Place 2 sprays into the nose daily.    Historical Provider, MD  Multiple Vitamins-Minerals (ONE-A-DAY WOMENS 50 PLUS PO) Take 1 tablet by mouth every evening.    Historical Provider, MD  nystatin (MYCOSTATIN) 100000 UNIT/ML suspension Take 5 mLs (500,000 Units total) by mouth 4 (four) times daily. Patient taking differently: Take 5 mLs by mouth daily as needed (flush).  04/01/16   Amy S Esterwood, PA-C  oxyCODONE-acetaminophen (PERCOCET/ROXICET) 5-325 MG tablet Take 1-2 tablets by mouth every 4 (four) hours as needed for moderate pain or severe pain. 04/30/16   [name], PA-C  RELPAX 40 MG tablet Take 1 tablet by mouth as needed for migraine and may take 1 additional tablet in 2 hours if no relief 01/16/16   Historical Provider, MD  traZODone (  DESYREL) 100 MG tablet Take 200 mg by mouth at bedtime.     Historical Provider, MD  vitamin E 100 UNIT capsule Take 100 Units by mouth every evening.    Historical Provider, MD    Family History Family History  Problem Relation Age of Onset  . Lung cancer Mother 19  . Heart attack Father 59  . Hypertension Brother   . Esophageal cancer Brother   . Diabetes Paternal Grandmother     questionable  . Colon cancer Neg Hx   . Kidney disease Neg Hx   . Liver disease Neg Hx     Social History Social History  Substance Use Topics  . Smoking status: Current Some Day Smoker    Years: 34.00    Types: Cigarettes  . Smokeless tobacco: Never Used     Comment: 04/25/2016 "2 cigarettes/wik sometimes; sometimes nothing for months; real smoker til 2014"  . Alcohol use No     Comment: Denies 08/24/16     Allergies   Penicillins   Review of Systems Review of Systems ROS reviewed and all are negative for acute change except as noted in the HPI.  Physical Exam Updated Vital Signs There were no vitals taken for this visit.  Physical Exam  Constitutional: She is oriented to person, place, and time. Vital signs are normal. She appears well-developed  and well-nourished.  HENT:  Head: Normocephalic and atraumatic.  Right Ear: Hearing normal.  Left Ear: Hearing normal.  Eyes: Conjunctivae and EOM are normal. Pupils are equal, round, and reactive to light.  Neck: Normal range of motion. Neck supple.  Cardiovascular: Normal rate, regular rhythm, normal heart sounds and intact distal pulses.   Pulmonary/Chest: Effort normal and breath sounds normal.  Abdominal: Soft. There is no tenderness.  Musculoskeletal: Normal range of motion.  Neurological: She is alert and oriented to person, place, and time.  Skin: Skin is warm and dry.  Psychiatric: She has a normal mood and affect. Her speech is normal and behavior is normal. Thought content normal.  Nursing note and vitals reviewed.  ED Treatments / Results  Labs (all labs ordered are listed, but only abnormal results are displayed) Labs Reviewed  [place], ED   EKG  EKG Interpretation  Date/Time:  Friday September 26 2016 16:00:25 EST Ventricular Rate:  74 PR Interval:    QRS Duration: 77 QT Interval:  464 QTC Calculation: 515 R Axis:   57 Text Interpretation:  Sinus rhythm Nonspecific T abnormalities, diffuse leads Prolonged QT interval No significant change since last tracing Confirmed by Maryan Rued  MD, [facility] ([id number]) on 09/26/2016 4:10:08 PM      Radiology Dg Chest 2 View  Result Date: 09/26/2016 CLINICAL DATA:  Chest pain EXAM: CHEST  2 VIEW COMPARISON:  01/23/2016 chest radiograph. FINDINGS: Partially visualized surgical hardware in the right proximal humerus. Bilateral breast prostheses with capsular calcification. Stable cardiomediastinal silhouette with normal heart size. No pneumothorax. No pleural effusion. Mildly hyperinflated lungs. No pulmonary edema. No acute consolidative airspace disease. IMPRESSION: 1. No acute cardiopulmonary disease. 2. Mildly hyperinflated lungs, which may indicate obstructive lung disease. Electronically Signed    By: Ilona Sorrel M.D.   On: 09/26/2016 16:36    Procedures Procedures (including critical care time)  Medications Ordered in ED Medications  metoCLOPramide (REGLAN) injection 10 mg (not administered)  ketorolac (TORADOL) 30 MG/ML injection 30 mg (not administered)  diphenhydrAMINE (BENADRYL) injection 25 mg (not administered)  morphine 4 MG/ML injection  2 mg (2 mg Intravenous Given 09/26/16 1643)     Initial Impression / Assessment and Plan / ED Course  I have reviewed the triage vital signs and the nursing notes.  Pertinent labs & imaging results that were available during my care of the patient were reviewed by me and considered in my medical decision making (see chart for details).  Final Clinical Impressions(s) / ED Diagnoses  {I have reviewed and evaluated the relevant laboratory values. {I have reviewed and evaluated the relevant imaging studies. {I have interpreted the relevant EKG. {I have reviewed the relevant previous healthcare records. {I have reviewed EMS Documentation. {I obtained HPI from historian. {Patient discussed with supervising physician.  ED Course:   Assessment: Pt is a 66 y.o. female presents with CP with onset 1400 while at rest. Notes nausea and feeling clammy. Risk Factors HLD, CAD. Given 324 ASA and zofran in clinic. Cath report last year shows Mid LAD 25% stenosed. Given analgesia in ED. Concern for cardiac etiology of Chest Pain. Medicine has been consulted and will see patient in the ED for likely admit. Pt does not meet criteria for CP protocol and a further evaluation is recommended. Pt has been re-evaluated prior to consult and VSS, NAD, heart RRR, pain 0/10, lungs CTAB. No acute abnormalities found on EKG and first round of cardiac enzymes negative. Heart Score 5. Admit for serial Trop.    Disposition/Plan:  Admit Pt acknowledges and agrees with plan  Supervising Physician Blanchie Dessert, MD  Final diagnoses:  Chest pain, unspecified type     New Prescriptions New Prescriptions   No medications on file     Shary Decamp, PA-C 09/26/16 [place], MD 09/28/16 2325

## 2016-09-26 NOTE — ED Notes (Addendum)
Attempted report; awaiting return call to this RN

## 2016-09-26 NOTE — ED Triage Notes (Signed)
Pt arrived via EMS from PCP's office after experiencing CP at the office. Pt had hx of cardiac injury which was found during a stress test in 2017. Pt pain at dr office was 4/10. Pt given 324 ASA by office staff. Pt declined nitro by EMS due to headache SE. Pt pain subsided en route. Pt given 4 mg zofran en route by EMS for nausea. No vomiting noted. VSS upon arrival. Pt is Alert and oriented x4.

## 2016-09-26 NOTE — Consult Note (Signed)
Cardiology Consultation Note    Patient ID: Dorothy White, MRN: [id number], DOB/AGE: 66-05-1951 66 y.o. Admit date: 09/26/2016   Date of Consult: 09/26/2016 Primary Physician: Jonathon Bellows, MD Primary Cardiologist: Dr. [name]  Chief Complaint: SOB, chest pain, generally feeling "bad" Reason for Consultation: chest pain Requesting MD: Dr. Tana Coast  HPI: Dorothy White is a 66 y.o. female with history of mild nonobstructive CAD in 01/2016, NICM (EF 40-45% by echo in 11/2015), TIA, depression/anxiety, migraine, anemia, chronic back pain, hyperlipidemia whom we are asked to see for chest pain. She was diagnosed with LV dysfunction in 11/2015 during an admission for possible complicated migraine with falling to her side while walking, slurred speech and blurry vision. Per notes she had normal brain MRI/MRA, normal MRI of the spine and had resolution of sx with treatment of migraine. 2D echo 12/18/15 showed EF 40-45%, moderate HK of basal inferior myocardium PASP 40. Nuc 01/03/16 showed < 2mm of horizontal ST segment depression with T wave inversions noted in the inferolateral leads during Lexiscan infusion, non-reversible medium defect of moderate severity present in the basal inferoseptal, mid inferoseptal and apical anterior location, no ischemia, low risk, EF 67%. Due to diffuse ST depression on EKG in July, she ultimately underwent LHC for definitive eval 02/20/16 showing 25% mLAD, normal LVEDP, EF 55-65%.   She presented to the ER today from urgent care. She has felt poorly for the last week. She has noticed dyspnea on exertion with activities of daily living such as walking to the mailbox or going up steps which is unusual for her. She has noticed palpitations that don't necessarily feel like a different heart rhythm but instead that her "heart is working harder." SOB is not worse lying down. Says she felt "swollen" last week but this has resolved. She has also noticed a left sided chest twinge today  lasting about 45 seconds at a time, on/off all day. This prompted her to call her PCP and she was asked to go to urgent care. From there EKG was abnormal so she was referred to [facility] ED. VSS. Pulse ox 100% RA. Labs unremarkable, troponin neg x 1. K 4.2. EKG shows nonspecific St-T changes similar to prior although less pronounced (subtle sagging II, III. AvF. V4-V6 and generalized T wave flattening), but QTc is quite prolonged at 545ms. She denies any recent weight loss. When asked if she is eating/drinking normally she reports she does not eat much in general because she doesn't feel like it. CXR NAD. She reports a lot of stress in her life recently but does not elaborate. She is currently asymptomatic after receiving Reglan, Toradol, and Benadryl for migraine.  Past Medical History:  Diagnosis Date  . Anemia    ?  Marland Kitchen Anxiety   . Arthritis    "everywhere" (04/25/2016)  . 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 Chronic back pain    "all over" (04/25/2016)  . Constipation   . Depression   . Diverticulitis   . GERD (gastroesophageal reflux disease)   . Headache    "weekly" (04/25/2016)  . HLD (hyperlipidemia)    hx (04/25/2016)  . 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 Mild CAD    a. 25% mLAD, otherwise no sig disease 01/2016 cath.  Marland Kitchen NICM (nonischemic cardiomyopathy) ([place])    a. EF 40-45% by echo [id number] at time of complicated migraine/neuro sx, 55-65% at time of cath  01/2016  . Stroke [facility]) 06/2013   "mini" stroke , ?possibly hemaplegic migraine per pt      Surgical History:  Past Surgical History:  Procedure Laterality Date  . AUGMENTATION MAMMAPLASTY  1980  . BASAL CELL CARCINOMA EXCISION     "tip of my nose"  . BUNIONECTOMY Bilateral 10/2003  . CARDIAC CATHETERIZATION  02/20/2016  . CARDIAC CATHETERIZATION N/A 02/20/2016   Procedure: Left Heart Cath and Coronary Angiography;  Surgeon: Jettie Booze, MD;   Location: [place] [facility];  Service: Cardiovascular;  Laterality: N/A;  . COLONOSCOPY    . DILATION AND CURETTAGE OF UTERUS    . FRACTURE SURGERY    . IR GENERIC HISTORICAL  03/18/2016   IR SINUS/FIST TUBE CHK-NON GI 03/18/2016 Aletta Edouard, MD [facility]INTER[facility]  . LAPAROSCOPIC SIGMOID COLECTOMY  04/25/2016  . LAPAROSCOPIC SIGMOID COLECTOMY N/A 04/25/2016   Procedure: LAPAROSCOPIC SIGMOID COLECTOMY;  Surgeon: Stark Klein, MD;  Location: [place];  Service: General;  Laterality: N/A;  . OOPHORECTOMY Bilateral ~ 1999  . ORIF HUMERUS FRACTURE Right 02/15/2015   Procedure: OPEN REDUCTION INTERNAL FIXATION (ORIF) PROXIMAL HUMERUS FRACTURE;  Surgeon: Netta Cedars, MD;  Location: [place];  Service: Orthopedics;  Laterality: Right;  . RHINOPLASTY  1976  . TONSILLECTOMY    . TUBAL LIGATION  ~ 1983  . VAGINAL HYSTERECTOMY  ~ 1998     Home Meds: Prior to Admission medications   Medication Sig Start Date End Date Taking? Authorizing Provider  ARTIFICIAL TEAR OP Apply 1 drop to eye daily as needed (dry eyes).   Yes Historical Provider, MD  Ascorbic Acid (VITAMIN C PO) Take 1 tablet by mouth every evening.   Yes Historical Provider, MD  aspirin EC 81 MG tablet Take 81 mg by mouth every other day.   Yes Historical Provider, MD  calcium-vitamin D (OSCAL WITH D) 500-200 MG-UNIT per tablet Take 1 tablet by mouth daily with breakfast.   Yes Historical Provider, MD  Cholecalciferol (VITAMIN D PO) Take 1 capsule by mouth every evening.   Yes Historical Provider, MD  clonazePAM (KLONOPIN) 1 MG tablet Take 1 tablet (1 mg total) by mouth daily as needed (anxiety/sleep.). Patient taking differently: Take 1 mg by mouth 2 (two) times daily as needed for anxiety (anxiety/sleep.).  02/15/16  Yes Dorothy Spark, MD  FLUoxetine (PROZAC) 20 MG capsule Take 60 mg by mouth daily.    Yes Historical Provider, MD  ibuprofen (ADVIL,MOTRIN) 200 MG tablet Take 600-800 mg by mouth every 6 (six) hours as needed for mild pain.   Yes  Historical Provider, MD  levocetirizine (XYZAL) 5 MG tablet Take 5 mg by mouth every evening.   Yes Historical Provider, MD  LUTEIN PO Take 1 capsule by mouth every evening.   Yes Historical Provider, MD  mometasone (NASONEX) 50 MCG/ACT nasal spray Place 2 sprays into the nose daily.   Yes Historical Provider, MD  Multiple Vitamins-Minerals (ONE-A-DAY WOMENS 50 PLUS PO) Take 1 tablet by mouth every evening.   Yes Historical Provider, MD  nystatin (MYCOSTATIN) 100000 UNIT/ML suspension Take 5 mLs (500,000 Units total) by mouth 4 (four) times daily. Patient taking differently: Take 5 mLs by mouth daily as needed (flush).  04/01/16  Yes Amy S Esterwood, PA-C  oxyCODONE-acetaminophen (PERCOCET/ROXICET) 5-325 MG tablet Take 1-2 tablets by mouth every 4 (four) hours as needed for moderate pain or severe pain. Patient taking differently: Take 1-2 tablets by mouth See admin instructions.  04/30/16  Yes [name],  PA-C  RELPAX 40 MG tablet Take 1 tablet by mouth as needed for migraine and may take 1 additional tablet in 2 hours if no relief 01/16/16  Yes Historical Provider, MD  traZODone (DESYREL) 100 MG tablet Take 200 mg by mouth at bedtime.    Yes Historical Provider, MD  vitamin E 100 UNIT capsule Take 100 Units by mouth every evening.   Yes Historical Provider, MD    Inpatient Medications:     Allergies:  Allergies  Allergen Reactions  . Penicillins Hives    Has patient had a PCN reaction causing immediate rash, facial/tongue/throat swelling, SOB or lightheadedness with hypotension: Yes Has patient had a PCN reaction causing severe rash involving mucus membranes or skin necrosis: No Has patient had a PCN reaction that required hospitalization No Has patient had a PCN reaction occurring within the last 10 years: No If all of the above answers are "NO", then may proceed with Cephalosporin use.     Social History   Social History  . Marital status: Divorced    Spouse name: N/A  .  Number of children: 0  . Years of education: 12   Occupational History  . retired Financial trader    Retired  .  [place] History Main Topics  . Smoking status: Current Some Day Smoker    Years: 34.00    Types: Cigarettes  . Smokeless tobacco: Never Used     Comment: 04/25/2016 "2 cigarettes/wik sometimes; sometimes nothing for months; real smoker til 2014"  . Alcohol use No     Comment: Denies 08/24/16  . Drug use: No     Comment: Denies 08/24/16  . Sexual activity: No   Other Topics Concern  . Not on file   Social History Narrative   Patient lives at home alone.    Patient is retired.   Education- High school.   Right handed.   Patient drinks one cup of coffee and a big glass of ice tea.     Family History  Problem Relation Age of Onset  . Lung cancer Mother 59  . Heart attack Father 27  . Hypertension Brother   . Esophageal cancer Brother   . Diabetes Paternal Grandmother     questionable  . Colon cancer Neg Hx   . Kidney disease Neg Hx   . Liver disease Neg Hx      Review of Systems: All other systems reviewed and are otherwise negative except as noted above.  Labs:  Lab Results  Component Value Date   WBC 6.0 09/26/2016   HGB 13.9 09/26/2016   HCT 42.7 09/26/2016   MCV 85.9 09/26/2016   PLT 286 09/26/2016     Recent Labs Lab 09/26/16 1608  NA 138  K 4.2  CL 107  CO2 22  BUN 13  CREATININE 0.71  CALCIUM 9.8  GLUCOSE 90   Lab Results  Component Value Date   CHOL 156 12/18/2015   HDL 47 12/18/2015   LDLCALC 89 12/18/2015   TRIG 100 12/18/2015   Lab Results  Component Value Date   DDIMER <0.27 09/25/2015    Radiology/Studies:  Dg Chest 2 View  Result Date: 09/26/2016 CLINICAL DATA:  Chest pain EXAM: CHEST  2 VIEW COMPARISON:  01/23/2016 chest radiograph. FINDINGS: Partially visualized surgical hardware in the right proximal humerus. Bilateral breast prostheses with capsular calcification. Stable cardiomediastinal  silhouette with normal heart size. No pneumothorax. No pleural effusion. Mildly hyperinflated lungs. No pulmonary  edema. No acute consolidative airspace disease. IMPRESSION: 1. No acute cardiopulmonary disease. 2. Mildly hyperinflated lungs, which may indicate obstructive lung disease. Electronically Signed   By: Ilona Sorrel M.D.   On: 09/26/2016 16:36    Wt Readings from Last 3 Encounters:  08/25/16 130 lb (59 kg)  04/25/16 122 lb 12.7 oz (55.7 kg)  04/23/16 122 lb 14.4 oz (55.7 kg)    EKG: EKG shows nonspecific St-T changes similar to prior although less pronounced (subtle sagging II, III. AvF. V4-V6 and generalized T wave flattening), but QTc is quite prolonged at 519ms.   Physical Exam: Blood pressure 132/72, pulse 71, temperature 98.1 F (36.7 C), temperature source Oral, resp. rate 12, SpO2 100 %. There is no height or weight on file to calculate BMI. General: Well developed thin WF, in no acute distress. Head: Normocephalic, atraumatic, sclera non-icteric, no xanthomas, nares are without discharge.  Neck: Negative for carotid bruits. JVD not elevated. Lungs: Clear bilaterally to auscultation without wheezes, rales, or rhonchi. Breathing is unlabored. Heart: RRR with S1 S2. No murmurs, rubs, or gallops appreciated. Abdomen: Soft, non-tender, non-distended with normoactive bowel sounds. No hepatomegaly. No rebound/guarding. No obvious abdominal masses. Msk:  Strength and tone appear normal for age. Mild kyphosis Extremities: No clubbing or cyanosis. No edema.  Distal pedal pulses are 2+ and equal bilaterally. Neuro: Alert and oriented X 3. No facial asymmetry. No focal deficit. Moves all extremities spontaneously. Psych:  Responds to questions appropriately with a normal affect.     Assessment and Plan  47F with mild nonobstructive CAD in 01/2016, NICM (EF 40-45% by echo in 11/2015), TIA, depression/anxiety, migraine, anemia, chronic back pain, hyperlipidemia whom we are asked to see  for dyspnea on exertion, intermittent brief episodes of chest pain. Also noted to have prolonged QT of 518ms on EKG.  1. Chest pain/dyspnea on exertion - chest pain is atypical but dyspnea on exertion is more concerning. She does not exhibit outright signs of CHF on exam but etiology of prior LV dysfunction is unclear. Agree with admission for observation and rule out. IM has ordered d-dimer (flight to [facility] in January 3-4 hours). Will order BNP to assess as well. Will repeat echocardiogram. Will discuss further plan with MD.  2. QT prolongation - unclear etiology and how it relates to acute episode. This was 445ms by prior tracing. Check Mg. Wrote care order to page cardiology if level is 1.8 or less, which would require repletion. Check serial EKGs. Offending agents may include Prozac and trazodone but these have been on board for 30 years. Per d/w MD, follow above information before holding. Monitor on telemetry.  3. Migraine headache - per IM.  4. H/o NICM and mild CAD - f/u echo as above.  Signed, Charlie Pitter PA-C 09/26/2016, 6:21 PM Pager: [phone]  History and all data above reviewed.  Patient examined.  I agree with the findings as above.  Atypical chest pain.  Non obstructive disease on cath last yearThe patient exam reveals COR:RRR  ,  Lungs: Clear  ,  Abd: Positive bowel sounds, no rebound no guarding, Ext.No edema  .  All available labs, radiology testing, previous records reviewed. Agree with documented assessment and plan. Chest pain:  Atypical no further cardiac work up.  QT prolonged:  This is an incidental finding but needs to be followed up.  Check Mg level and repeat EKG in AM.  Decreased EF.  Repeat echo.    Minus Breeding  6:55 PM  09/26/2016

## 2016-09-26 NOTE — H&P (Signed)
History and Physical        Hospital Admission Note Date: 09/26/2016  Patient name: Dorothy White Medical record number: [id number] Date of birth: 03-27-1951 Age: 66 y.o. Gender: female  PCP: WEBB, [name], MD   Patient coming from: Dr.'s office   Chief Complaint:  Chest pain  HPI: Patient is a 66 year old female with history of CAD, hyperlipidemia, smoking presented to ED with complaints of chest pain in the doctor's office today. History was obtained from the patient who reported that she has been having URI symptoms for the last 1 week. Hence she went to the [place] walk-in clinic and was waiting for more than an hour and was feeling nauseated. While on the examination table she was lying down, she started feeling chest pressure on the left side of the chest under the axilla. She denied any radiation of the chest pain, described as a dull pressure, pain 3-4/10 associated with shortness of breath, nausea, palpitations and diaphoresis. She has also noticed shortness of breath with exertion for the last month. She was given Zofran and aspirin 325mg  x1 in the clinic.At the time of my examination, chest pain has if improved. Patient had extensive cardiac workup in the past year including cardiac cath in which she had mid LAD lesion 25% stenosis, LV function 55-65%.  ED work-up/course:  Temp 98.1, respiratory rate 17, pulse 79, BP 135/81  Review of Systems: Positives marked in 'bold' Constitutional: Denies fever, chills, diaphoresis, poor appetite and fatigue.  HEENT: Denies photophobia, eye pain, redness, hearing loss, ear pain, congestion, sore throat, rhinorrhea, sneezing, mouth sores, trouble swallowing, neck pain, neck stiffness and tinnitus.   Respiratory: Please see history of present illness   Cardiovascular: Please see history of present illness  Gastrointestinal: Denies  nausea, vomiting, abdominal pain, diarrhea, constipation, blood in stool and abdominal distention.  Genitourinary: Denies dysuria, urgency, frequency, hematuria, flank pain and difficulty urinating.  Musculoskeletal: Denies myalgias,  joint swelling, arthralgias and gait problem. + chronic back pain  Skin: Denies pallor, rash and wound.  Neurological: Denies dizziness, seizures, syncope, weakness, light-headedness, numbness and headaches.  Hematological: Denies adenopathy. Easy bruising, personal or family bleeding history  Psychiatric/Behavioral: Denies suicidal ideation, mood changes, confusion, nervousness, sleep disturbance and agitation  Past Medical History: Past Medical History:  Diagnosis Date  . Anemia    ?  Marland Kitchen Anginal pain ([place])    last few months-mild CAD 01/2016 cath  . Anxiety   . Arrhythmia   . Arthritis    "everywhere" (04/25/2016)  . 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 Chronic back pain    "all over" (04/25/2016)  . Constipation   . Coronary artery disease   . Depression   . Diverticulitis   . GERD (gastroesophageal reflux disease)   . Headache    "weekly" (04/25/2016)  . HLD (hyperlipidemia)    hx (04/25/2016)  . 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 Myocardial infarction    "found it when I did stress test"  . Stroke [facility]) 06/2013   "mini" stroke , ?possibly hemaplegic migraine per pt    Past Surgical  History:  Procedure Laterality Date  . AUGMENTATION MAMMAPLASTY  1980  . BASAL CELL CARCINOMA EXCISION     "tip of my nose"  . BUNIONECTOMY Bilateral 10/2003  . CARDIAC CATHETERIZATION  02/20/2016  . CARDIAC CATHETERIZATION N/A 02/20/2016   Procedure: Left Heart Cath and Coronary Angiography;  Surgeon: Jettie Booze, MD;  Location: [place] [facility];  Service: Cardiovascular;  Laterality: N/A;  . COLONOSCOPY    . DILATION AND CURETTAGE OF UTERUS    . FRACTURE SURGERY    . IR GENERIC  HISTORICAL  03/18/2016   IR SINUS/FIST TUBE CHK-NON GI 03/18/2016 Aletta Edouard, MD [facility]-INTERV RAD  . LAPAROSCOPIC SIGMOID COLECTOMY  04/25/2016  . LAPAROSCOPIC SIGMOID COLECTOMY N/A 04/25/2016   Procedure: LAPAROSCOPIC SIGMOID COLECTOMY;  Surgeon: Stark Klein, MD;  Location: [place];  Service: General;  Laterality: N/A;  . OOPHORECTOMY Bilateral ~ 1999  . ORIF HUMERUS FRACTURE Right 02/15/2015   Procedure: OPEN REDUCTION INTERNAL FIXATION (ORIF) PROXIMAL HUMERUS FRACTURE;  Surgeon: Netta Cedars, MD;  Location: [place];  Service: Orthopedics;  Laterality: Right;  . RHINOPLASTY  1976  . TONSILLECTOMY    . TUBAL LIGATION  ~ 1983  . VAGINAL HYSTERECTOMY  ~ 1998    Medications: Prior to Admission medications   Medication Sig Start Date End Date Taking? Authorizing Provider  ARTIFICIAL TEAR OP Apply 1 drop to eye daily as needed (dry eyes).   Yes Historical Provider, MD  Ascorbic Acid (VITAMIN C PO) Take 1 tablet by mouth every evening.   Yes Historical Provider, MD  aspirin EC 81 MG tablet Take 81 mg by mouth every other day.   Yes Historical Provider, MD  calcium-vitamin D (OSCAL WITH D) 500-200 MG-UNIT per tablet Take 1 tablet by mouth daily with breakfast.   Yes Historical Provider, MD  Cholecalciferol (VITAMIN D PO) Take 1 capsule by mouth every evening.   Yes Historical Provider, MD  clonazePAM (KLONOPIN) 1 MG tablet Take 1 tablet (1 mg total) by mouth daily as needed (anxiety/sleep.). Patient taking differently: Take 1 mg by mouth 2 (two) times daily as needed for anxiety (anxiety/sleep.).  02/15/16  Yes Dorothy Spark, MD  FLUoxetine (PROZAC) 20 MG capsule Take 60 mg by mouth daily.    Yes Historical Provider, MD  ibuprofen (ADVIL,MOTRIN) 200 MG tablet Take 600-800 mg by mouth every 6 (six) hours as needed for mild pain.   Yes Historical Provider, MD  levocetirizine (XYZAL) 5 MG tablet Take 5 mg by mouth every evening.   Yes Historical Provider, MD  LUTEIN PO Take 1 capsule by mouth every  evening.   Yes Historical Provider, MD  mometasone (NASONEX) 50 MCG/ACT nasal spray Place 2 sprays into the nose daily.   Yes Historical Provider, MD  Multiple Vitamins-Minerals (ONE-A-DAY WOMENS 50 PLUS PO) Take 1 tablet by mouth every evening.   Yes Historical Provider, MD  nystatin (MYCOSTATIN) 100000 UNIT/ML suspension Take 5 mLs (500,000 Units total) by mouth 4 (four) times daily. Patient taking differently: Take 5 mLs by mouth daily as needed (flush).  04/01/16  Yes Amy S Esterwood, PA-C  oxyCODONE-acetaminophen (PERCOCET/ROXICET) 5-325 MG tablet Take 1-2 tablets by mouth every 4 (four) hours as needed for moderate pain or severe pain. Patient taking differently: Take 1-2 tablets by mouth See admin instructions.  04/30/16  Yes [name], PA-C  RELPAX 40 MG tablet Take 1 tablet by mouth as needed for migraine and may take 1 additional tablet in 2 hours if no relief 01/16/16  Yes Historical Provider, MD  traZODone (DESYREL) 100 MG tablet Take 200 mg by mouth at bedtime.    Yes Historical Provider, MD  vitamin E 100 UNIT capsule Take 100 Units by mouth every evening.   Yes Historical Provider, MD    Allergies:   Allergies  Allergen Reactions  . Penicillins Hives    Has patient had a PCN reaction causing immediate rash, facial/tongue/throat swelling, SOB or lightheadedness with hypotension: Yes Has patient had a PCN reaction causing severe rash involving mucus membranes or skin necrosis: No Has patient had a PCN reaction that required hospitalization No Has patient had a PCN reaction occurring within the last 10 years: No If all of the above answers are "NO", then may proceed with Cephalosporin use.     Social History:  reports that she has been smoking Cigarettes.  She has smoked for the past 34.00 years. She has never used smokeless tobacco. She reports that she does not drink alcohol or use drugs.  Family History: Family History  Problem Relation Age of Onset  . Lung cancer  Mother 63  . Heart attack Father 56  . Hypertension Brother   . Esophageal cancer Brother   . Diabetes Paternal Grandmother     questionable  . Colon cancer Neg Hx   . Kidney disease Neg Hx   . Liver disease Neg Hx     Physical Exam: Blood pressure 137/85, pulse 75, temperature 98.1 F (36.7 C), temperature source Oral, resp. rate 13, SpO2 100 %. General: Alert, awake, oriented x3, in no acute distress. HEENT: normocephalic, atraumatic, anicteric sclera, pink conjunctiva, pupils equal and reactive to light and accomodation, oropharynx clear Neck: supple, no masses or lymphadenopathy, no goiter, no bruits  Heart: Regular rate and rhythm, without murmurs, rubs or gallops. Lungs: Clear to auscultation bilaterally, no wheezing, rales or rhonchi. Abdomen: Soft, nontender, nondistended, positive bowel sounds, no masses. Extremities: No clubbing, cyanosis or edema with positive pedal pulses. Neuro: Grossly intact, no focal neurological deficits, strength 5/5 upper and lower extremities bilaterally Psych: alert and oriented x 3, normal mood and affect Skin: no rashes or lesions, warm and dry   LABS on Admission:  Basic Metabolic Panel:  Recent Labs Lab 09/26/16 1608  NA 138  K 4.2  CL 107  CO2 22  GLUCOSE 90  BUN 13  CREATININE 0.71  CALCIUM 9.8   Liver Function Tests: No results for input(s): AST, ALT, ALKPHOS, BILITOT, PROT, ALBUMIN in the last 168 hours. No results for input(s): LIPASE, AMYLASE in the last 168 hours. No results for input(s): AMMONIA in the last 168 hours. CBC:  Recent Labs Lab 09/26/16 1608  WBC 6.0  HGB 13.9  HCT 42.7  MCV 85.9  PLT 286   Cardiac Enzymes: No results for input(s): CKTOTAL, CKMB, CKMBINDEX, TROPONINI in the last 168 hours. BNP: Invalid input(s): POCBNP CBG: No results for input(s): GLUCAP in the last 168 hours.  Radiological Exams on Admission:  No results found.  *I have personally reviewed the images  above*  EKG: Independently reviewed. Rate 74, prolonged QTC 515, no acute ST-T-wave changes suggestive of ischemia   Assessment/Plan Principal Problem:   Chest pain atypical with shortness of breath - Admit to telemetry, obtain serial cardiac enzymes, d-dimer - Recent cardiac cath in July 2017 showed preserved EF 55-65% with mid LAD lesion 25% stenosis - EDP calling cardiology for consultation, may need stress test or further workup given new exertional dyspnea symptoms. - Chest x-ray shows hyperinflated lungs,  patient does have history of smoking, may have underlying COPD. Placed on albuterol inhaler as needed, currently not wheezing. I also recommended her to follow up with pulmonology outpatient for PFTs to establish diagnosis.  - Continue aspirin   Active Problems:    GERD - Place on PPI  Nicotine abuse - Counseled on tobacco cessation, placed on nicotine patch  Prolonged QTC - Hold trazodone, Prozac  Anxiety - Continue Klonopin  Chronic back pain - Continue home dose of Percocet   DVT prophylaxis: Lovenox  CODE STATUS: Full CODE STATUS  Consults called: EDP calling cardiology  Family Communication: Admission, patients condition and plan of care including tests being ordered have been discussed with the patient and  Husband who indicates understanding and agree with the plan and Code Status  Admission status: Observation, telemetry  Disposition plan: Further plan will depend as patient's clinical course evolves and further radiologic and laboratory data become available.   At the time of admission, it appears that the appropriate admission status for this patient is Observation . This is judged to be reasonable and necessary in order to provide the required intensity of service to ensure the patient's safety given the presenting symptoms, physical exam findings, and initial radiographic and laboratory data in the context of their chronic comorbidities.     Time  Spent on Admission: 60 minutes   Lola Lofaro M.D. [facility] 09/26/2016, 6:00 PM Pager: [phone]  If 7PM-7AM, please contact night-coverage [email or web address] Password [email or web address]

## 2016-09-27 ENCOUNTER — Encounter (HOSPITAL_COMMUNITY): Payer: Self-pay | Admitting: *Deleted

## 2016-09-27 ENCOUNTER — Observation Stay (HOSPITAL_COMMUNITY): Payer: PPO

## 2016-09-27 DIAGNOSIS — Z79899 Other long term (current) drug therapy: Secondary | ICD-10-CM | POA: Diagnosis not present

## 2016-09-27 DIAGNOSIS — R0789 Other chest pain: Secondary | ICD-10-CM | POA: Diagnosis not present

## 2016-09-27 DIAGNOSIS — Z7982 Long term (current) use of aspirin: Secondary | ICD-10-CM | POA: Diagnosis not present

## 2016-09-27 DIAGNOSIS — I2510 Atherosclerotic heart disease of native coronary artery without angina pectoris: Secondary | ICD-10-CM | POA: Diagnosis not present

## 2016-09-27 DIAGNOSIS — R0602 Shortness of breath: Secondary | ICD-10-CM | POA: Diagnosis not present

## 2016-09-27 DIAGNOSIS — F17210 Nicotine dependence, cigarettes, uncomplicated: Secondary | ICD-10-CM | POA: Diagnosis not present

## 2016-09-27 DIAGNOSIS — Z8673 Personal history of transient ischemic attack (TIA), and cerebral infarction without residual deficits: Secondary | ICD-10-CM | POA: Diagnosis not present

## 2016-09-27 DIAGNOSIS — I252 Old myocardial infarction: Secondary | ICD-10-CM | POA: Diagnosis not present

## 2016-09-27 DIAGNOSIS — K219 Gastro-esophageal reflux disease without esophagitis: Secondary | ICD-10-CM | POA: Diagnosis not present

## 2016-09-27 LAB — RAPID URINE DRUG SCREEN, HOSP PERFORMED
Amphetamines: NOT DETECTED
BENZODIAZEPINES: POSITIVE — AB
Barbiturates: NOT DETECTED
COCAINE: NOT DETECTED
OPIATES: POSITIVE — AB
Tetrahydrocannabinol: NOT DETECTED

## 2016-09-27 LAB — ECHOCARDIOGRAM COMPLETE
AVLVOTPG: 6 mmHg
CHL CUP DOP CALC LVOT VTI: 26.9 cm
CHL CUP MV DEC (S): 211
CHL CUP TV REG PEAK VELOCITY: 282 cm/s
E decel time: 211 msec
E/e' ratio: 8.74
FS: 26 % — AB (ref 28–44)
HEIGHTINCHES: 65 in
IVS/LV PW RATIO, ED: 1.03
LA diam index: 2.22 cm/m2
LASIZE: 35 mm
LAVOL: 43.4 mL
LAVOLA4C: 44.6 mL
LAVOLIN: 27.5 mL/m2
LDCA: 2.84 cm2
LEFT ATRIUM END SYS DIAM: 35 mm
LV E/e'average: 8.74
LV PW d: 11.6 mm — AB (ref 0.6–1.1)
LV e' LATERAL: 8.38 cm/s
LVEEMED: 8.74
LVOT SV: 76 mL
LVOT peak vel: 125 cm/s
LVOTD: 19 mm
Lateral S' vel: 17.4 cm/s
MV Peak grad: 2 mmHg
MV pk E vel: 73.2 m/s
MVPKAVEL: 54.3 m/s
RV sys press: 35 mmHg
TAPSE: 21.1 mm
TDI e' lateral: 8.38
TDI e' medial: 8.61
TRMAXVEL: 282 cm/s
WEIGHTICAEL: 1894.4 [oz_av]

## 2016-09-27 LAB — TROPONIN I

## 2016-09-27 MED ORDER — PANTOPRAZOLE SODIUM 40 MG PO TBEC: 40.0000 mg | DELAYED_RELEASE_TABLET | Freq: Every day | ORAL | 0 refills | Status: DC

## 2016-09-27 MED ORDER — FLUOXETINE HCL 20 MG PO CAPS: 40.0000 mg | ORAL_CAPSULE | Freq: Every day | ORAL | 3 refills | Status: DC

## 2016-09-27 MED ORDER — ZOLPIDEM TARTRATE 5 MG PO TABS: 5.0000 mg | ORAL_TABLET | Freq: Every evening | ORAL | 0 refills | Status: DC | PRN

## 2016-09-27 NOTE — Discharge Summary (Signed)
Physician Discharge Summary   Patient ID: Dorothy White MRN: [id number] DOB/AGE: [birth year] 66 y.o.  Admit date: 09/26/2016 Discharge date: 09/27/2016  Primary Care Physician:  Jonathon Bellows, MD  Discharge Diagnoses:    . Atypical  Chest pain . GERD . Hyperlipidemia   Consults:  Cardiology  Recommendations for Outpatient Follow-up:  1. Please repeat CBC/BMET at next visit   DIET: Heart healthy diet    Allergies:   Allergies  Allergen Reactions  . Penicillins Hives    Has patient had a PCN reaction causing immediate rash, facial/tongue/throat swelling, SOB or lightheadedness with hypotension: Yes Has patient had a PCN reaction causing severe rash involving mucus membranes or skin necrosis: No Has patient had a PCN reaction that required hospitalization No Has patient had a PCN reaction occurring within the last 10 years: No If all of the above answers are "NO", then may proceed with Cephalosporin use.      DISCHARGE MEDICATIONS: Discharge Medication List as of 09/27/2016 10:47 AM    START taking these medications   Details  pantoprazole (PROTONIX) 40 MG tablet Take 1 tablet (40 mg total) by mouth daily at 6 (six) AM., Starting Sun 09/28/2016, Print    zolpidem (AMBIEN) 5 MG tablet Take 1 tablet (5 mg total) by mouth at bedtime as needed for sleep., Starting Sat 09/27/2016, Print      CONTINUE these medications which have CHANGED   Details  FLUoxetine (PROZAC) 20 MG capsule Take 2 capsules (40 mg total) by mouth daily., Starting Sat 09/27/2016, No Print      CONTINUE these medications which have NOT CHANGED   Details  ARTIFICIAL TEAR OP Apply 1 drop to eye daily as needed (dry eyes)., Historical Med    Ascorbic Acid (VITAMIN C PO) Take 1 tablet by mouth every evening., Until Discontinued, Historical Med    aspirin EC 81 MG tablet Take 81 mg by mouth every other day., Until Discontinued, Historical Med    calcium-vitamin D (OSCAL WITH D) 500-200 MG-UNIT per  tablet Take 1 tablet by mouth daily with breakfast., Until Discontinued, Historical Med    Cholecalciferol (VITAMIN D PO) Take 1 capsule by mouth every evening., Until Discontinued, Historical Med    clonazePAM (KLONOPIN) 1 MG tablet Take 1 tablet (1 mg total) by mouth daily as needed (anxiety/sleep.)., Starting Fri 02/15/2016, Print    levocetirizine (XYZAL) 5 MG tablet Take 5 mg by mouth every evening., Historical Med    LUTEIN PO Take 1 capsule by mouth every evening., Until Discontinued, Historical Med    mometasone (NASONEX) 50 MCG/ACT nasal spray Place 2 sprays into the nose daily., Historical Med    Multiple Vitamins-Minerals (ONE-A-DAY WOMENS 50 PLUS PO) Take 1 tablet by mouth every evening., Until Discontinued, Historical Med    oxyCODONE-acetaminophen (PERCOCET/ROXICET) 5-325 MG tablet Take 1-2 tablets by mouth every 4 (four) hours as needed for moderate pain or severe pain., Starting Wed 04/30/2016, Print    RELPAX 40 MG tablet Take 1 tablet by mouth as needed for migraine and may take 1 additional tablet in 2 hours if no relief, Starting 01/16/2016, Until Discontinued, Historical Med    vitamin E 100 UNIT capsule Take 100 Units by mouth every evening., Until Discontinued, Historical Med      STOP taking these medications     ibuprofen (ADVIL,MOTRIN) 200 MG tablet      nystatin (MYCOSTATIN) 100000 UNIT/ML suspension      traZODone (DESYREL) 100 MG tablet  Brief H and P: For complete details please refer to admission H and P, but in brief Patient is a 66 year old female with history of CAD, hyperlipidemia, smoking presented to ED with complaints of chest pain in the doctor's office today. History was obtained from the patient who reported that she has been having URI symptoms for the last 1 week. Hence she went to the [place] walk-in clinic and was waiting for more than an hour and was feeling nauseated. While on the examination table she was lying down, she started  feeling chest pressure on the left side of the chest under the axilla. She denied any radiation of the chest pain, described as a dull pressure, pain 3-4/10 associated with shortness of breath, nausea, palpitations and diaphoresis. She has also noticed shortness of breath with exertion for the last month. She was given Zofran and aspirin 325mg  x1 in the clinic.At the time of my examination, chest pain has if improved. Patient had extensive cardiac workup in the past year including cardiac cath in which she had mid LAD lesion 25% stenosis, LV function 55-65%.  Hospital Course:   Chest pain atypical with shortness of breath - Patient was admitted to telemetry, no arrhythmias. Serial cardiac enzymes were obtained which were negative.  D-dimer was negative.  - Patient had Recent cardiac cath in July 2017 showed preserved EF 55-65% with mid LAD lesion 25% stenosis - Cardiology was consulted, recommended 2-D echocardiogram given atypical symptoms and exertional dyspnea.  - Chest x-ray shows hyperinflated lungs, patient does have history of smoking, may have underlying COPD. Placed on albuterol inhaler as needed, currently not wheezing. I also recommended her to follow up with pulmonology outpatient for PFTs to establish diagnosis.  - Continue aspirin      GERD - Place on PPI  Nicotine abuse - Counseled on tobacco cessation, placed on nicotine patch while inpatient  Prolonged QTC - EKG on admission showed prolonged QTC 515. Trazodone and Prozac were held and QTC improved to 422. - Trazodone was discontinued. Prozac was weaned down to 40 mg daily. Patient was recommended to wean off Prozac slowly after talking to her PCP and change to another antidepressant.   Anxiety - Continue Klonopin  Chronic back pain - Continue home dose of Percocet  Day of Discharge BP 113/63 (BP Location: Right Arm)   Pulse 66   Temp 98.1 F (36.7 C) (Oral)   Resp 12   Ht 5\' 5"  (1.651 m)   Wt 53.7 kg (118 lb  6.4 oz)   SpO2 99%   BMI 19.70 kg/m   Physical Exam: General: Alert and awake oriented x3 not in any acute distress. HEENT: anicteric sclera, pupils reactive to light and accommodation CVS: S1-S2 clear no murmur rubs or gallops Chest: clear to auscultation bilaterally, no wheezing rales or rhonchi Abdomen: soft nontender, nondistended, normal bowel sounds Extremities: no cyanosis, clubbing or edema noted bilaterally Neuro: Cranial nerves II-XII intact, no focal neurological deficits   The results of significant diagnostics from this hospitalization (including imaging, microbiology, ancillary and laboratory) are listed below for reference.    LAB RESULTS: Basic Metabolic Panel:  Recent Labs Lab 09/26/16 1608 09/26/16 1924 09/26/16 2008  NA 138  --   --   K 4.2  --   --   CL 107  --   --   CO2 22  --   --   GLUCOSE 90  --   --   BUN 13  --   --  CREATININE 0.71  --  0.95  CALCIUM 9.8  --   --   MG  --  1.8  --    Liver Function Tests: No results for input(s): AST, ALT, ALKPHOS, BILITOT, PROT, ALBUMIN in the last 168 hours. No results for input(s): LIPASE, AMYLASE in the last 168 hours. No results for input(s): AMMONIA in the last 168 hours. CBC:  Recent Labs Lab 09/26/16 1608 09/26/16 2008  WBC 6.0 5.8  HGB 13.9 12.8  HCT 42.7 39.9  MCV 85.9 85.8  PLT 286 272   Cardiac Enzymes:  Recent Labs Lab 09/27/16 0210 09/27/16 0706  TROPONINI <0.03 <0.03   BNP: Invalid input(s): POCBNP CBG: No results for input(s): GLUCAP in the last 168 hours.  Significant Diagnostic Studies:  Dg Chest 2 View  Result Date: 09/26/2016 CLINICAL DATA:  Chest pain EXAM: CHEST  2 VIEW COMPARISON:  01/23/2016 chest radiograph. FINDINGS: Partially visualized surgical hardware in the right proximal humerus. Bilateral breast prostheses with capsular calcification. Stable cardiomediastinal silhouette with normal heart size. No pneumothorax. No pleural effusion. Mildly hyperinflated  lungs. No pulmonary edema. No acute consolidative airspace disease. IMPRESSION: 1. No acute cardiopulmonary disease. 2. Mildly hyperinflated lungs, which may indicate obstructive lung disease. Electronically Signed   By: Ilona Sorrel M.D.   On: 09/26/2016 16:36    2D ECHO: Study Conclusions  - Left ventricle: The cavity size was normal. Wall thickness was   increased in a pattern of mild LVH. Systolic function was normal.   The estimated ejection fraction was in the range of 60% to 65%.   Wall motion was normal; there were no regional wall motion   abnormalities. - Pulmonary arteries: Systolic pressure was mildly increased. PA   peak pressure: 35 mm Hg (S).   Disposition and Follow-up: Discharge Instructions    Diet - low sodium heart healthy    Complete by:  As directed    Increase activity slowly    Complete by:  As directed        DISPOSITION: [place], MD. Schedule an appointment as soon as possible for a visit in 2 week(s).   Specialty:  Family Medicine Contact information: [place] [address] [phone]            Time spent on Discharge: 25 minutes  Signed:   Delon Revelo M.D. [facility] 09/27/2016, 11:25 AM Pager: [phone]

## 2016-09-27 NOTE — Progress Notes (Signed)
  Echocardiogram 2D Echocardiogram has been performed.  Dorothy White 09/27/2016, 9:26 AM

## 2016-10-26 IMAGING — MR MR HEAD W/O CM
10 series · 47 of 48 positions shown · non-contrast
Comparison: 03/29/2014 MRI brain from [HOSPITAL]. Also MRI brain
08/06/2013 from [HOSPITAL].

CLINICAL DATA: Patient tripped and fell sustaining head trauma with
a blood optic. This occurred several weeks ago. Headaches since that
time.

EXAM:
MRI HEAD WITHOUT CONTRAST
TECHNIQUE: Multiplanar, multiecho pulse sequences of the brain and surrounding
structures were obtained without intravenous contrast.

[Series 2: t1_se_sag · sagittal · 5.0mm · 0.45mm/px · 2 of 21 slices shown]
[im 1/21]
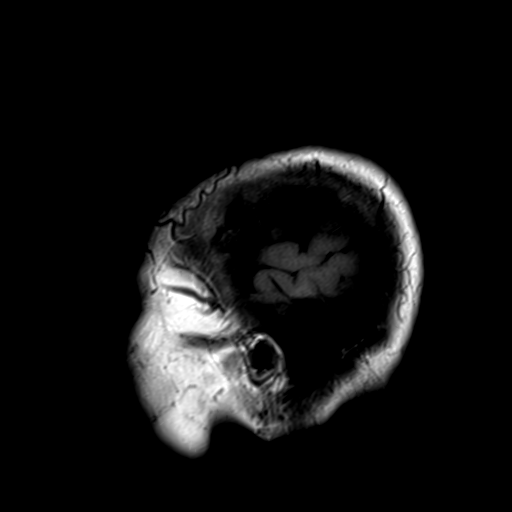
[im 21/21]
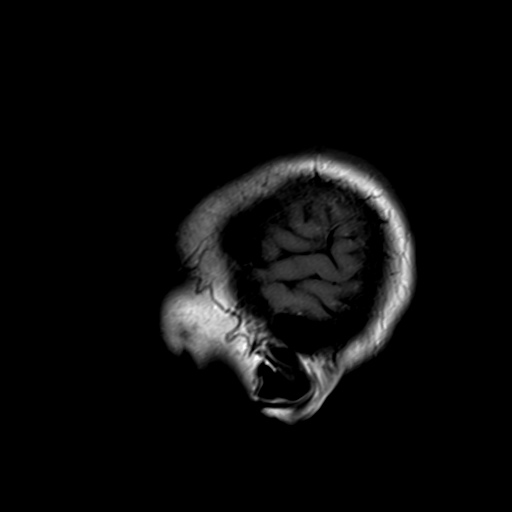

[Series 3: ep2d_diff_(id)_trace · axial · 3.0mm · 1.80mm/px · z∈[-80,+66]mm · 9 of 96 slices shown]
[im 1/96]
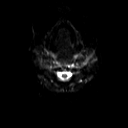
[im 12/96]
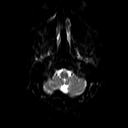
[im 24/96]
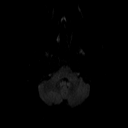
[im 36/96]
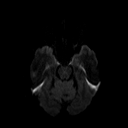
[im 48/96]
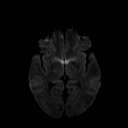
[im 60/96]
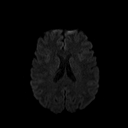
[im 72/96]
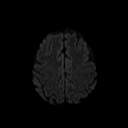
[im 84/96]
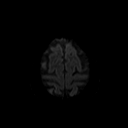
[im 96/96]
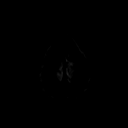

[Series 4: ep2d_diff_(id)_trace_adc · axial · 3.0mm · 1.80mm/px · z∈[-80,+66]mm · 5 of 49 slices shown]
[im 1/49]
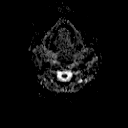
[im 13/49]
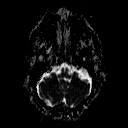
[im 25/49]
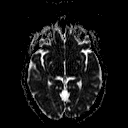
[im 37/49]
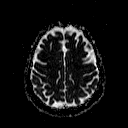
[im 49/49]
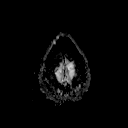

[Series 6: swi_images · axial · 2.0mm · 0.90mm/px · z∈[-86,+72]mm · 8 of 80 slices shown]
[im 1/80]
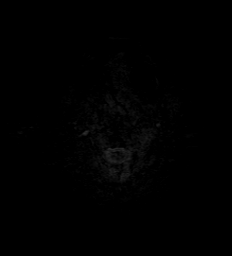
[im 12/80]
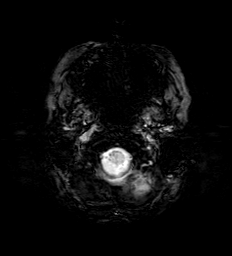
[im 23/80]
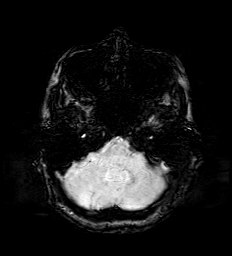
[im 34/80]
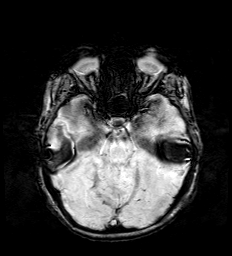
[im 46/80]
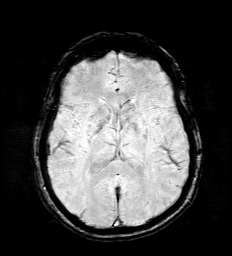
[im 57/80]
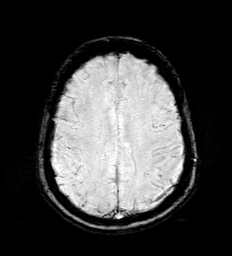
[im 68/80]
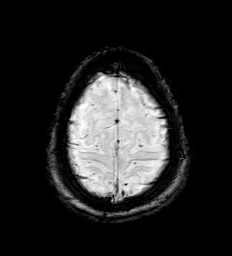
[im 80/80]
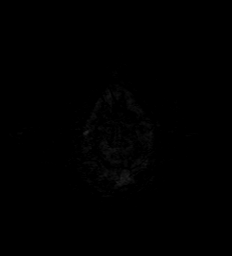

[Series 7: ep2d_diff cor for · coronal · 5.0mm · 0.90mm/px · 7 of 68 slices shown (1 of 2)]
[im 1/68]
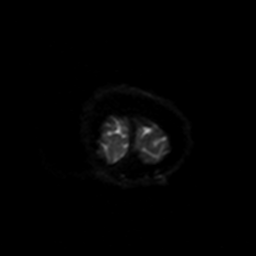
[im 12/68]
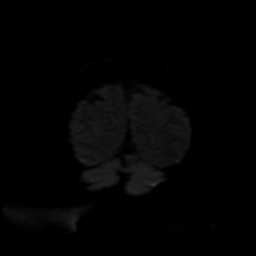
[im 23/68]
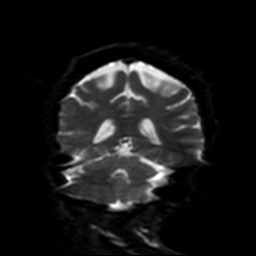
[im 34/68]
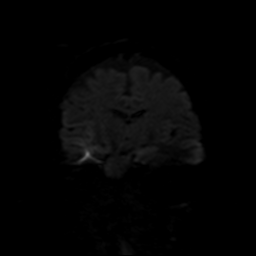
[im 45/68]
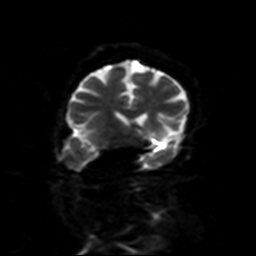
[im 56/68]
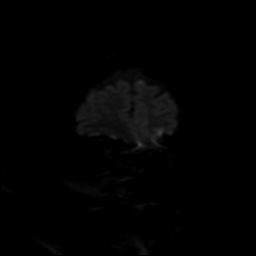
[im 68/68]
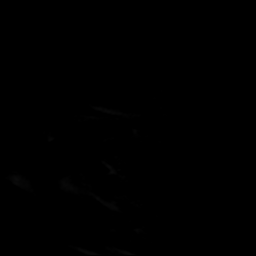

[Series 8: ep2d_diff cor for · coronal · 5.0mm · 0.90mm/px · 3 of 34 slices shown (2 of 2)]
[im 1/34]
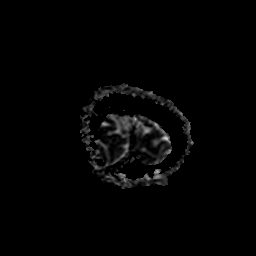
[im 17/34]
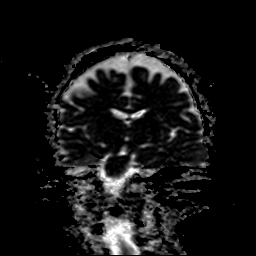
[im 34/34]
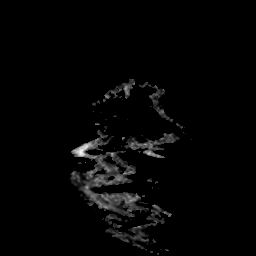

[Series 9: FLAIR · axial · 5.0mm · 0.45mm/px · z∈[-76,+62]mm · 2 of 23 slices shown]
[im 1/23]
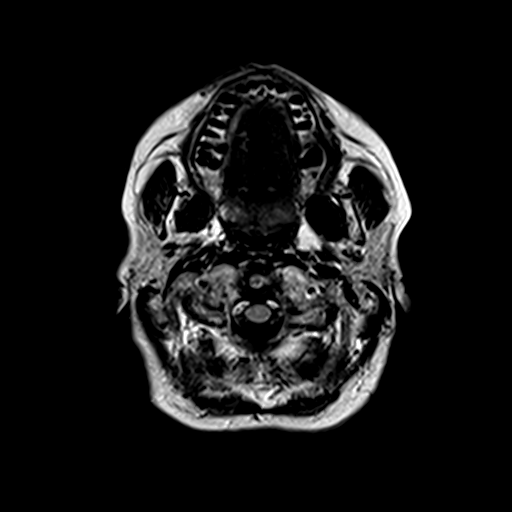
[im 23/23]
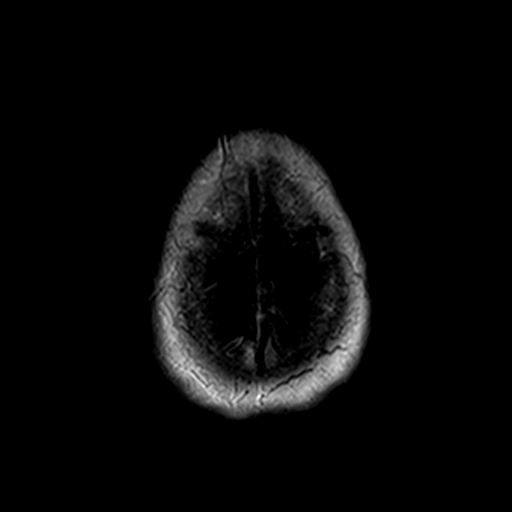

[Series 10: t2_tse_tra_512 · axial · 5.0mm · 0.60mm/px · z∈[-76,+61]mm · 2 of 23 slices shown]
[im 1/23]
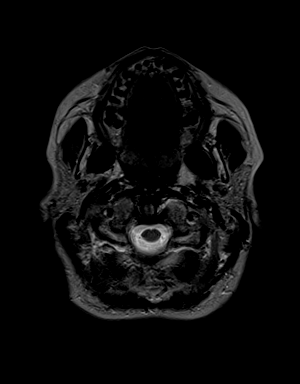
[im 23/23]
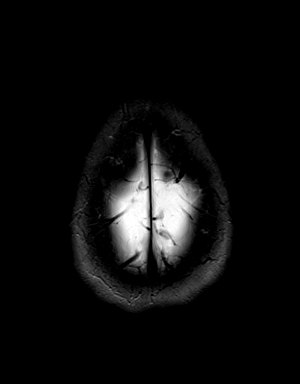

[Series 11: t1_mpr_tra · axial · 2.0mm · 0.45mm/px · z∈[-86,+48]mm · 7 of 80 slices shown]
[im 1/80]
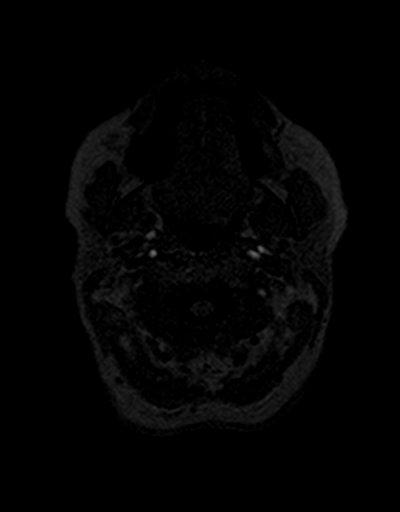
[im 12/80]
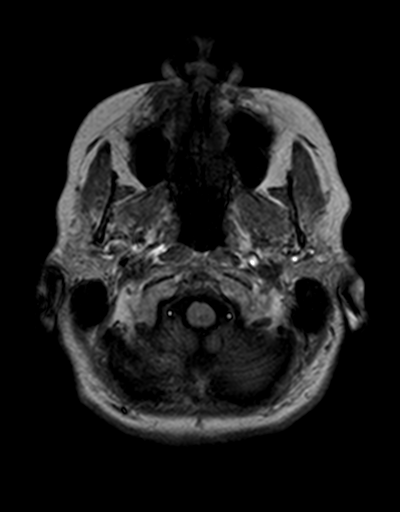
[im 23/80]
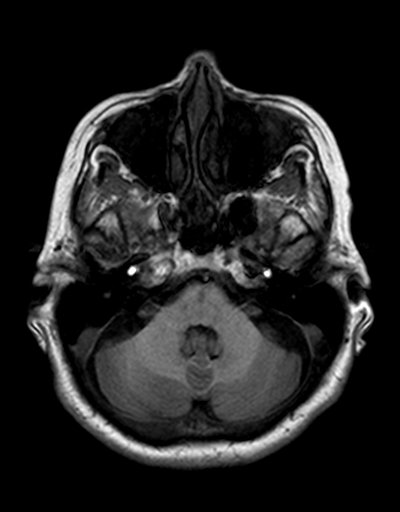
[im 34/80]
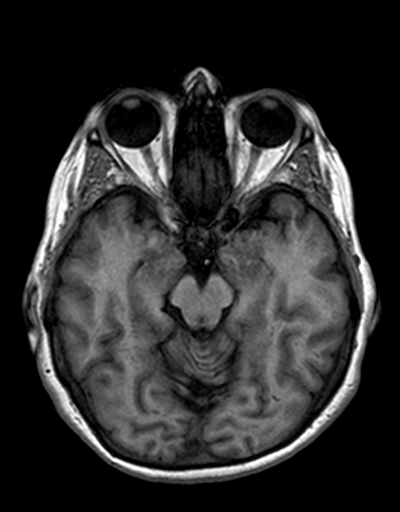
[im 46/80]
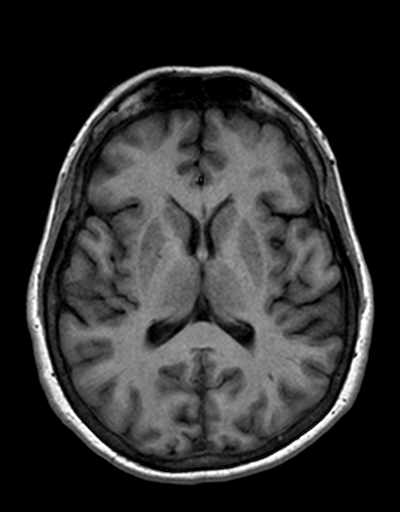
[im 57/80]
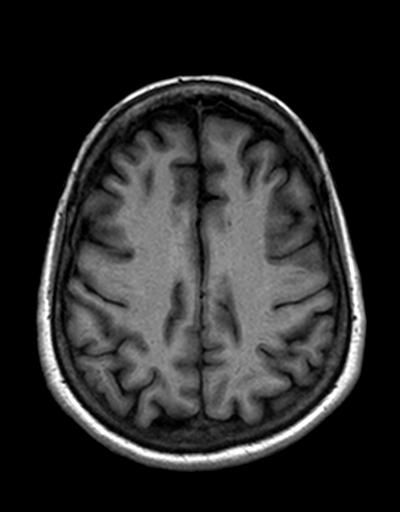
[im 68/80]
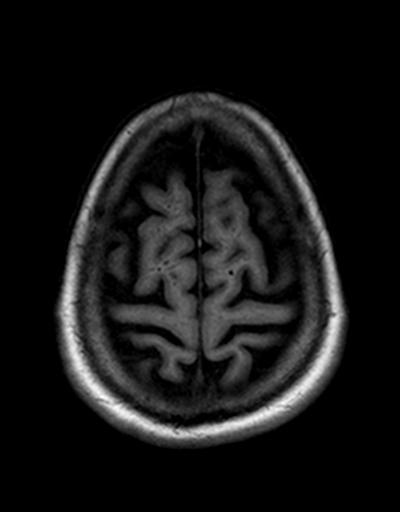

[Series 12: T2 · coronal · 5.0mm · 0.45mm/px · 2 of 25 slices shown]
[im 1/25]
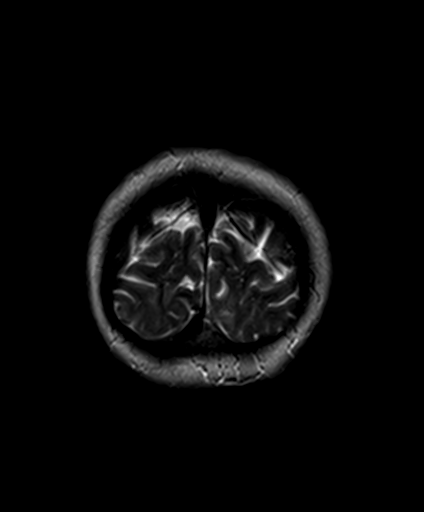
[im 25/25]
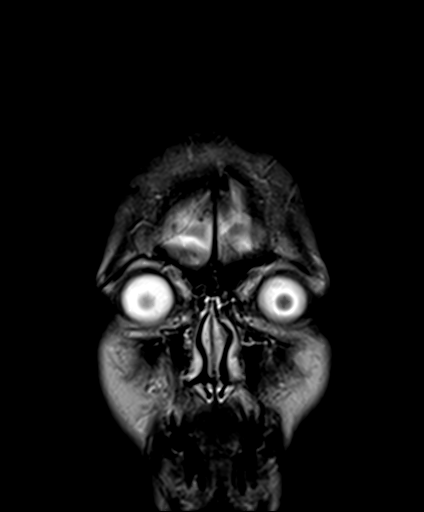

[47 of 48 positions shown; findings below may reference images not displayed]

FINDINGS: No evidence for acute infarction, hemorrhage, mass lesion,
hydrocephalus, or extra-axial fluid. Mild cerebral and cerebellar
atrophy. Minor T2 and FLAIR hyperintensities throughout the
periventricular and subcortical white matter are nonspecific.
Considerations would include chronic microvascular ischemic change
or complicated migraine. Vasculitis, demyelinating disease, or
chronic infection less likely. Flow voids are maintained throughout
the carotid, basilar, and vertebral arteries. There are no areas of
chronic hemorrhage. No evidence for occult shearing injury or occult
subdural hematoma. Prominent perivascular spaces. Extracranial soft
tissues unremarkable. Similar appearance to priors.
IMPRESSION: Stable minor nonspecific white matter disease. No acute intracranial
findings. No significant interval change from previous unremarkable
MR.

## 2016-11-03 DIAGNOSIS — K5792 Diverticulitis of intestine, part unspecified, without perforation or abscess without bleeding: Secondary | ICD-10-CM | POA: Diagnosis not present

## 2016-11-13 ENCOUNTER — Emergency Department (HOSPITAL_COMMUNITY)
Admission: EM | Admit: 2016-11-13 | Discharge: 2016-11-14 | Disposition: A | Discharge status: Home / Self Care | Payer: PPO | Attending: Emergency Medicine | Admitting: Emergency Medicine

## 2016-11-13 ENCOUNTER — Encounter (HOSPITAL_COMMUNITY): Payer: Self-pay

## 2016-11-13 DIAGNOSIS — Z7982 Long term (current) use of aspirin: Secondary | ICD-10-CM | POA: Diagnosis not present

## 2016-11-13 DIAGNOSIS — N3001 Acute cystitis with hematuria: Secondary | ICD-10-CM | POA: Diagnosis not present

## 2016-11-13 DIAGNOSIS — R1031 Right lower quadrant pain: Secondary | ICD-10-CM | POA: Diagnosis not present

## 2016-11-13 DIAGNOSIS — Z8673 Personal history of transient ischemic attack (TIA), and cerebral infarction without residual deficits: Secondary | ICD-10-CM | POA: Diagnosis not present

## 2016-11-13 DIAGNOSIS — I2510 Atherosclerotic heart disease of native coronary artery without angina pectoris: Secondary | ICD-10-CM | POA: Insufficient documentation

## 2016-11-13 DIAGNOSIS — F17210 Nicotine dependence, cigarettes, uncomplicated: Secondary | ICD-10-CM | POA: Insufficient documentation

## 2016-11-13 DIAGNOSIS — Z79899 Other long term (current) drug therapy: Secondary | ICD-10-CM | POA: Diagnosis not present

## 2016-11-13 DIAGNOSIS — N390 Urinary tract infection, site not specified: Secondary | ICD-10-CM | POA: Diagnosis not present

## 2016-11-13 LAB — URINALYSIS, ROUTINE W REFLEX MICROSCOPIC
BILIRUBIN URINE: NEGATIVE
Glucose, UA: NEGATIVE mg/dL
KETONES UR: NEGATIVE mg/dL
NITRITE: NEGATIVE
PH: 5 (ref 5.0–8.0)
Protein, ur: 100 mg/dL — AB
Specific Gravity, Urine: 1.016 (ref 1.005–1.030)

## 2016-11-13 MED ORDER — OXYCODONE-ACETAMINOPHEN 5-325 MG PO TABS
1.0000 | ORAL_TABLET | Freq: Once | ORAL | Status: AC
  Administered 2016-11-13: 1 via ORAL
  Filled 2016-11-13: qty 1

## 2016-11-13 MED ORDER — SODIUM CHLORIDE 0.9 % IV BOLUS (SEPSIS)
1000.0000 mL | Freq: Once | INTRAVENOUS | Status: AC
  Administered 2016-11-14: 1000 mL via INTRAVENOUS

## 2016-11-13 NOTE — ED Notes (Signed)
Pt states unable to urinate. Last time urine came out was this morning at 0800. Endorses nausea.

## 2016-11-13 NOTE — ED Provider Notes (Signed)
[facility] DEPT Provider Note   CSN: [id number] Arrival date & time: 11/13/16  2029    By signing my name below, I, Dorothy White, attest that this documentation has been prepared under the direction and in the presence of Dorothy White A Jadrian Bulman PA-C,. Electronically Signed: Macon White, ED Scribe. 11/13/16. 11:58 PM.  History   Chief Complaint No chief complaint on file.  The history is provided by the patient. No language interpreter was used.   HPI Comments: Dorothy White is a 66 y.o. female with PMHx of diverticulitis, HLD, who presents to the Emergency Department complaining of 7/10, moderate, constant, RLQ abdominal pain onset earlier this afternoon. She reports associated difficulty urinating and nausea. Pt notes her abdominal pain radiates to her lower back. She states she has been unable to pass a bowel movement today. Per pt, she notes having a bowel scan with a recorded 100cc of urine retention. Per nurse note, pt states her last normal urine output was today at ~8:00am. No alleviating factors noted. Pt reports having a h/o of abdominal surgeries. She denies fever and blood in urine.   Past Medical History:  Diagnosis Date  . Anemia    ?  Marland Kitchen Anxiety   . Arthritis    "everywhere" (04/25/2016)  . 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 Chronic back pain    "all over" (04/25/2016)  . Constipation   . Depression   . Diverticulitis   . GERD (gastroesophageal reflux disease)   . Headache    "weekly" (04/25/2016)  . HLD (hyperlipidemia)    hx (04/25/2016)  . 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 Mild CAD    a. 25% mLAD, otherwise no sig disease 01/2016 cath.  Marland Kitchen NICM (nonischemic cardiomyopathy) ([place])    a. EF 40-45% by echo 10/979 at time of complicated migraine/neuro sx, 55-65% at time of cath 01/2016  . Stroke [facility]) 06/2013   "mini" stroke , ?possibly hemaplegic migraine per pt    Patient Active  Problem List   Diagnosis Date Noted  . Chest pain 09/26/2016  . Diverticulitis of sigmoid colon 04/25/2016  . Infection due to ESBL-producing Escherichia coli/Diverticular Abscess 03/15/2016  . Diverticulitis 03/12/2016  . Chronic pain syndrome 03/12/2016  . Lesion of pancreas 03/12/2016  . History of chest pain 03/12/2016  . Hypokalemia 03/12/2016  . Angina, class IV ([place]) - chest tightness and pressure with dyspnea with minimal exertion 02/17/2016    Class: Question of  . TIA (transient ischemic attack) 12/26/2015  . DOE (dyspnea on exertion) 12/26/2015  . Right sided weakness 12/17/2015  . Ataxia 12/17/2015  . Shoulder fracture 02/15/2015  . Chronic headache 10/11/2013  . Weakness generalized 08/12/2013  . Dizziness and giddiness 08/12/2013  . Abnormality of gait 07/11/2013  . Hyperlipidemia 05/07/2007  . COMMON MIGRAINE 05/07/2007  . PREMATURE VENTRICULAR CONTRACTIONS, FREQUENT 05/07/2007  . GERD 05/07/2007  . DIVERTICULOSIS, COLON 05/07/2007  . 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 OSTEOPENIA 05/07/2007  . Anxiety and depression 03/24/2007  . HEMORRHOIDS 03/24/2007  . ALLERGIC RHINITIS 03/24/2007  . LOW BACK PAIN 03/24/2007  . MIGRAINES, HX OF 03/24/2007    Past Surgical History:  Procedure Laterality Date  . AUGMENTATION MAMMAPLASTY  1980  . BASAL CELL CARCINOMA EXCISION     "tip of my nose"  . BUNIONECTOMY Bilateral 10/2003  . CARDIAC CATHETERIZATION N/A 02/20/2016   Procedure: Left Heart Cath and Coronary Angiography;  Surgeon: Charlann Lange  Irish Lack, MD;  Location: [facility];  Service: Cardiovascular;  Laterality: N/A;  . COLONOSCOPY    . DILATION AND CURETTAGE OF UTERUS    . FRACTURE SURGERY    . IR GENERIC HISTORICAL  03/18/2016   IR SINUS/FIST TUBE CHK-NON GI 03/18/2016 Dorothy Edouard, MD [facility]-INTER[facility] RAD  . LAPAROSCOPIC SIGMOID COLECTOMY N/A 04/25/2016   Procedure: LAPAROSCOPIC SIGMOID COLECTOMY;  Surgeon: Stark Klein, MD;  Location: [facility];  Service:  General;  Laterality: N/A;  . OOPHORECTOMY Bilateral ~ 1999  . ORIF HUMERUS FRACTURE Right 02/15/2015   Procedure: OPEN REDUCTION INTERNAL FIXATION (ORIF) PROXIMAL HUMERUS FRACTURE;  Surgeon: Netta Cedars, MD;  Location: [facility];  Service: Orthopedics;  Laterality: Right;  . RHINOPLASTY  1976  . TONSILLECTOMY    . TUBAL LIGATION  ~ 1983  . VAGINAL HYSTERECTOMY  ~ 1998    OB History    No data available       Home Medications    Prior to Admission medications   Medication Sig Start Date End Date Taking? Authorizing Provider  ARTIFICIAL TEAR OP Apply 1 drop to eye daily as needed (dry eyes).   Yes Historical Provider, MD  Ascorbic Acid (VITAMIN C PO) Take 1 tablet by mouth every evening.   Yes Historical Provider, MD  aspirin EC 81 MG tablet Take 81 mg by mouth daily.    Yes Historical Provider, MD  buPROPion (WELLBUTRIN) 100 MG tablet Take 100 mg by mouth 2 (two) times daily. 11/13/16  Yes Historical Provider, MD  calcium-vitamin D (OSCAL WITH D) 500-200 MG-UNIT per tablet Take 1 tablet by mouth daily with breakfast.   Yes Historical Provider, MD  Cholecalciferol (VITAMIN D PO) Take 1 capsule by mouth every evening.   Yes Historical Provider, MD  clonazePAM (KLONOPIN) 1 MG tablet Take 1 tablet (1 mg total) by mouth daily as needed (anxiety/sleep.). Patient taking differently: Take 1 mg by mouth 2 (two) times daily as needed for anxiety (anxiety/sleep.).  02/15/16  Yes Dorothy Spark, MD  FLUoxetine (PROZAC) 20 MG capsule Take 2 capsules (40 mg total) by mouth daily. 09/27/16  Yes Ripudeep Krystal Eaton, MD  levocetirizine (XYZAL) 5 MG tablet Take 5 mg by mouth every evening.   Yes Historical Provider, MD  LUTEIN PO Take 1 capsule by mouth every evening.   Yes Historical Provider, MD  mometasone (NASONEX) 50 MCG/ACT nasal spray Place 2 sprays into the nose daily.   Yes Historical Provider, MD  oxyCODONE-acetaminophen (PERCOCET/ROXICET) 5-325 MG tablet Take 1-2 tablets by mouth every 4 (four) hours  as needed for moderate pain or severe pain. Patient taking differently: Take 1-2 tablets by mouth See admin instructions.  04/30/16  Yes [name], PA-C  RELPAX 40 MG tablet Take 1 tablet by mouth as needed for migraine and may take 1 additional tablet in 2 hours if no relief 01/16/16  Yes Historical Provider, MD  traMADol (ULTRAM) 50 MG tablet Take 50 mg by mouth every 6 (six) hours as needed for pain. 11/13/16  Yes Historical Provider, MD  traZODone (DESYREL) 100 MG tablet Take 200 mg by mouth at bedtime as needed for sleep. 11/12/16  Yes Historical Provider, MD  vitamin E 100 UNIT capsule Take 100 Units by mouth every evening.   Yes Historical Provider, MD  pantoprazole (PROTONIX) 40 MG tablet Take 1 tablet (40 mg total) by mouth daily at 6 (six) AM. 09/28/16   Ripudeep Krystal Eaton, MD  zolpidem (AMBIEN) 5 MG tablet Take 1  tablet (5 mg total) by mouth at bedtime as needed for sleep. Patient not taking: Reported on 11/13/2016 09/27/16   Ripudeep Krystal Eaton, MD    Family History Family History  Problem Relation Age of Onset  . Lung cancer Mother 75  . Heart attack Father     Vague  . Hypertension Brother   . Esophageal cancer Brother   . Diabetes Paternal Grandmother     questionable  . Colon cancer Neg Hx   . Kidney disease Neg Hx   . Liver disease Neg Hx     Social History Social History  Substance Use Topics  . Smoking status: Current Some Day Smoker    Years: 34.00    Types: Cigarettes  . Smokeless tobacco: Never Used     Comment: 04/25/2016 "2 cigarettes/wik sometimes; sometimes nothing for months; real smoker til 2014"  . Alcohol use No     Comment: Denies 08/24/16     Allergies   Penicillins   Review of Systems Review of Systems  Constitutional: Negative for fever.  Respiratory: Negative for shortness of breath.   Cardiovascular: Negative for chest pain.  Gastrointestinal: Positive for abdominal pain and nausea.  Genitourinary: Positive for difficulty urinating.    Musculoskeletal: Positive for back pain.  Neurological: Negative for weakness.     Physical Exam Updated Vital Signs BP (!) 146/69 (BP Location: Left Arm)   Pulse 88   Temp 97.9 F (36.6 C) (Oral)   Wt 119 lb (54 kg)   SpO2 100%   BMI 19.80 kg/m   Physical Exam  Constitutional: She is oriented to person, place, and time. She appears well-developed and well-nourished.  HENT:  Head: Normocephalic and atraumatic.  Eyes: Conjunctivae are normal. Right eye exhibits no discharge. Left eye exhibits no discharge.  Pulmonary/Chest: Effort normal. No respiratory distress.  Abdominal: Soft. There is no guarding.  Mild suprapubic tenderness without significant distention.  Musculoskeletal: Normal range of motion.  Neurological: She is alert and oriented to person, place, and time. Coordination normal.  Skin: Skin is warm and dry. No rash noted. She is not diaphoretic. No erythema.  Psychiatric: She has a normal mood and affect.  Nursing note and vitals reviewed.    ED Treatments / Results   DIAGNOSTIC STUDIES: Oxygen Saturation is 100% on RA, normal by my interpretation.    COORDINATION OF CARE: 10:45 PM Discussed treatment plan with pt at bedside which includes labs, foley catheter and pain medication and pt agreed to plan.   Labs (all labs ordered are listed, but only abnormal results are displayed) Labs Reviewed  URINALYSIS, ROUTINE W REFLEX MICROSCOPIC - Abnormal; Notable for the following:       Result Value   APPearance CLOUDY (*)    Hgb urine dipstick MODERATE (*)    Protein, ur 100 (*)    Leukocytes, UA MODERATE (*)    Bacteria, UA RARE (*)    Squamous Epithelial / LPF 0-5 (*)    Non Squamous Epithelial 0-5 (*)    All other components within normal limits  URINE CULTURE    EKG  EKG Interpretation None       Radiology No results found.  Procedures Procedures (including critical care time)  Medications Ordered in ED Medications  sodium chloride 0.9  % bolus 1,000 mL (not administered)  oxyCODONE-acetaminophen (PERCOCET/ROXICET) 5-325 MG per tablet 1 tablet (1 tablet Oral Given 11/13/16 2335)     Initial Impression / Assessment and Plan / ED Course  I have  reviewed the triage vital signs and the nursing notes.  Pertinent labs & imaging results that were available during my care of the patient were reviewed by me and considered in my medical decision making (see chart for details).     Patient with complaint of urinary retention since this morning. Bladder scan shows approximately 100 cc, confirmed by foley placement. She is positive for urinary tract infection. No fever, tachycardia or evidence/concern for sepsis. Levaquin provided (significant Penicillin allergy). She is felt stable for discharge home. Pyridium recommended. She has oxycodone at home given to her by Dr. Nelva Bush.   Final Clinical Impressions(s) / ED Diagnoses   Final diagnoses:  None   1. UTI  New Prescriptions New Prescriptions   No medications on file    I personally performed the services described in this documentation, which was scribed in my presence. The recorded information has been reviewed and is accurate.      Charlann Lange, PA-C 11/14/16 0312    April Palumbo, MD 11/14/16 [phone]

## 2016-11-14 MED ORDER — LEVOFLOXACIN 500 MG PO TABS: 500.0000 mg | ORAL_TABLET | Freq: Every day | ORAL | 0 refills | Status: DC

## 2016-11-14 MED ORDER — PHENAZOPYRIDINE HCL 200 MG PO TABS: 200.0000 mg | ORAL_TABLET | Freq: Three times a day (TID) | ORAL | Status: DC

## 2016-11-14 MED ORDER — PHENAZOPYRIDINE HCL 200 MG PO TABS: 200.0000 mg | ORAL_TABLET | Freq: Three times a day (TID) | ORAL | 0 refills | Status: DC

## 2016-11-14 MED ORDER — OXYCODONE-ACETAMINOPHEN 5-325 MG PO TABS
1.0000 | ORAL_TABLET | Freq: Once | ORAL | Status: AC
  Administered 2016-11-14: 1 via ORAL
  Filled 2016-11-14: qty 1

## 2016-11-14 MED ORDER — LEVOFLOXACIN IN D5W 500 MG/100ML IV SOLN
500.0000 mg | Freq: Once | INTRAVENOUS | Status: AC
  Administered 2016-11-14: 500 mg via INTRAVENOUS
  Filled 2016-11-14: qty 100

## 2016-11-15 LAB — URINE CULTURE: Culture: NO GROWTH

## 2016-11-21 DIAGNOSIS — R399 Unspecified symptoms and signs involving the genitourinary system: Secondary | ICD-10-CM | POA: Diagnosis not present

## 2016-11-23 IMAGING — CR DG FOREARM 2V*R*
2 series · 2 of 2 positions shown · non-contrast
Comparison: None.

CLINICAL DATA: Four days of right forearm pain and swelling with
out known injury

EXAM:
RIGHT FOREARM - 2 VIEW

[x forearm ap right]
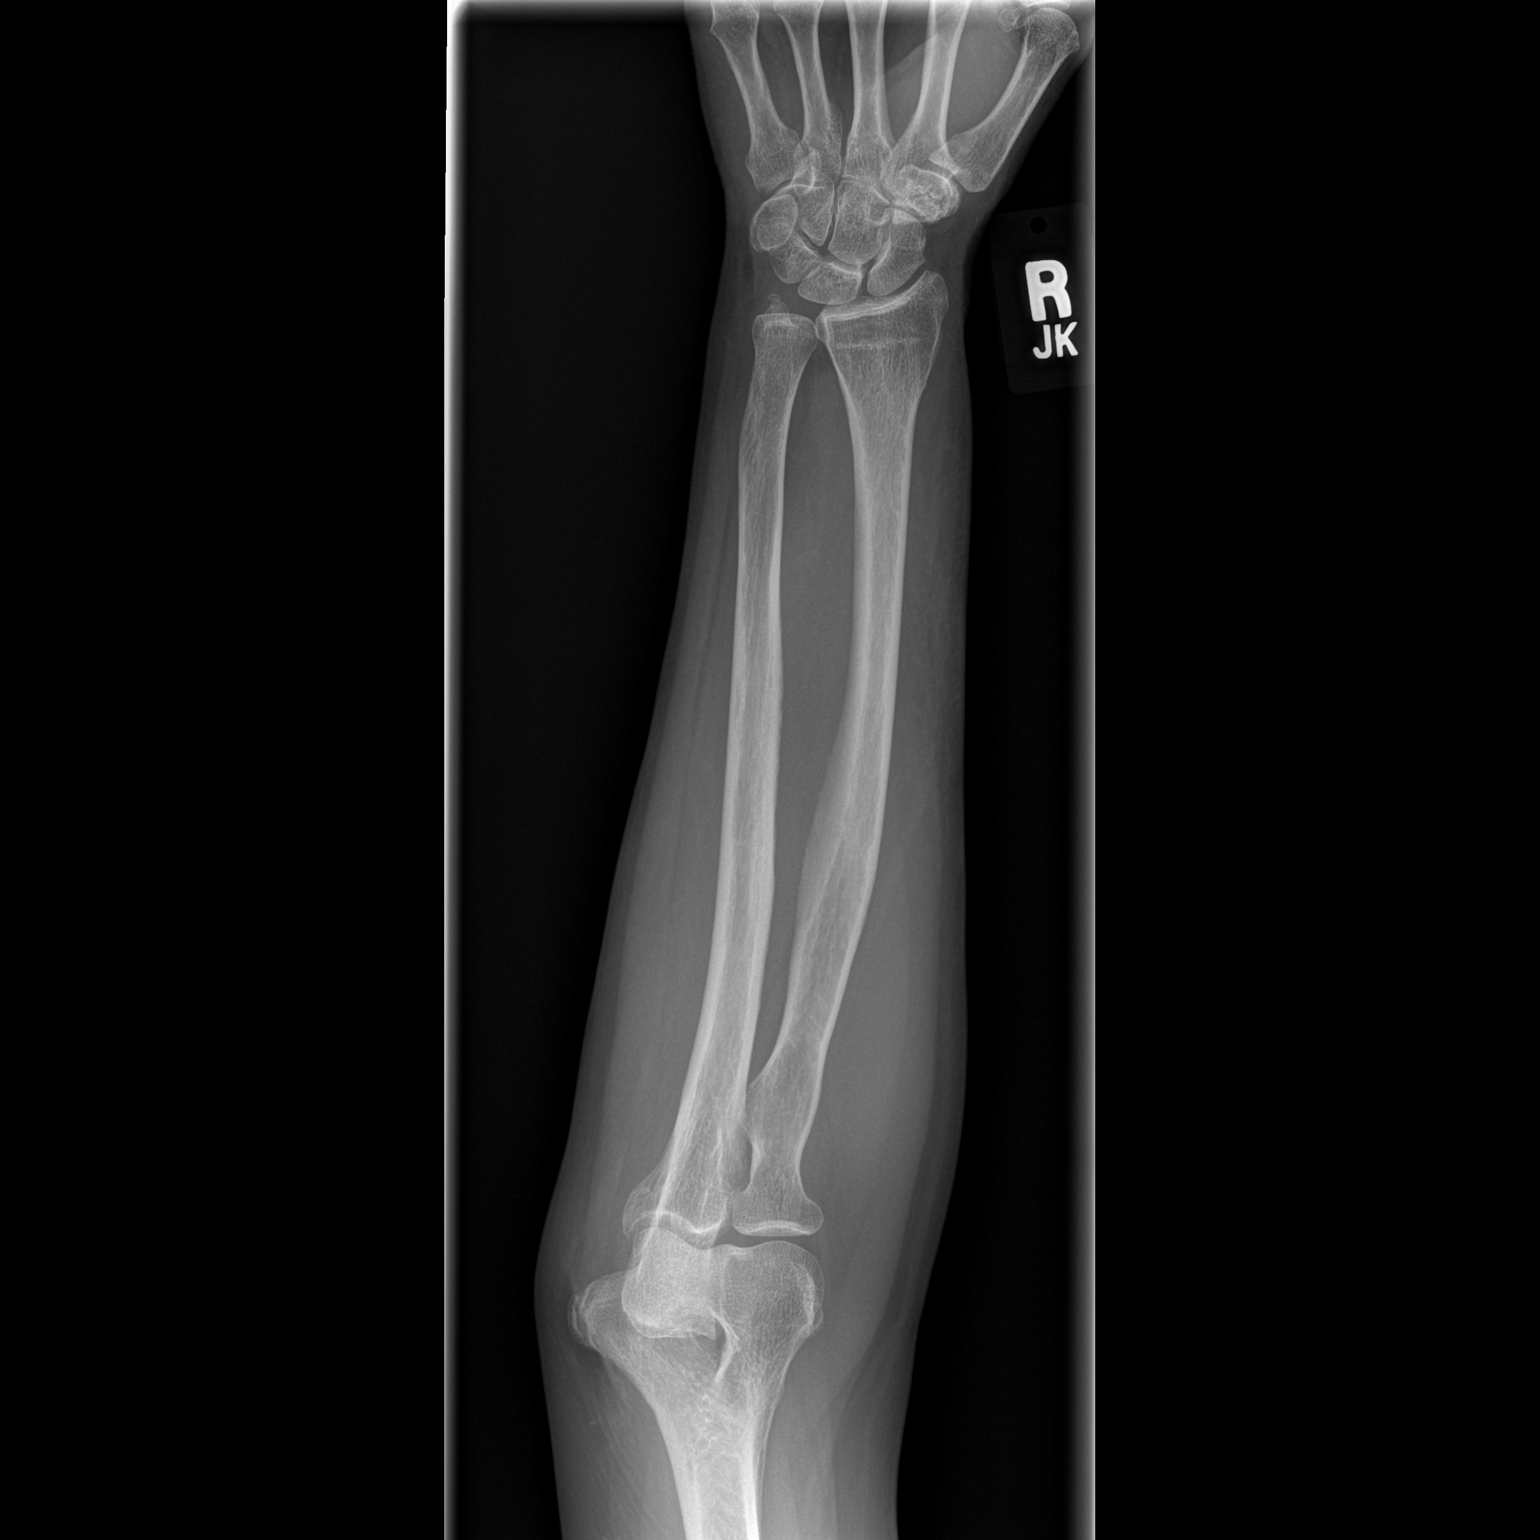

[x forearm lat right]
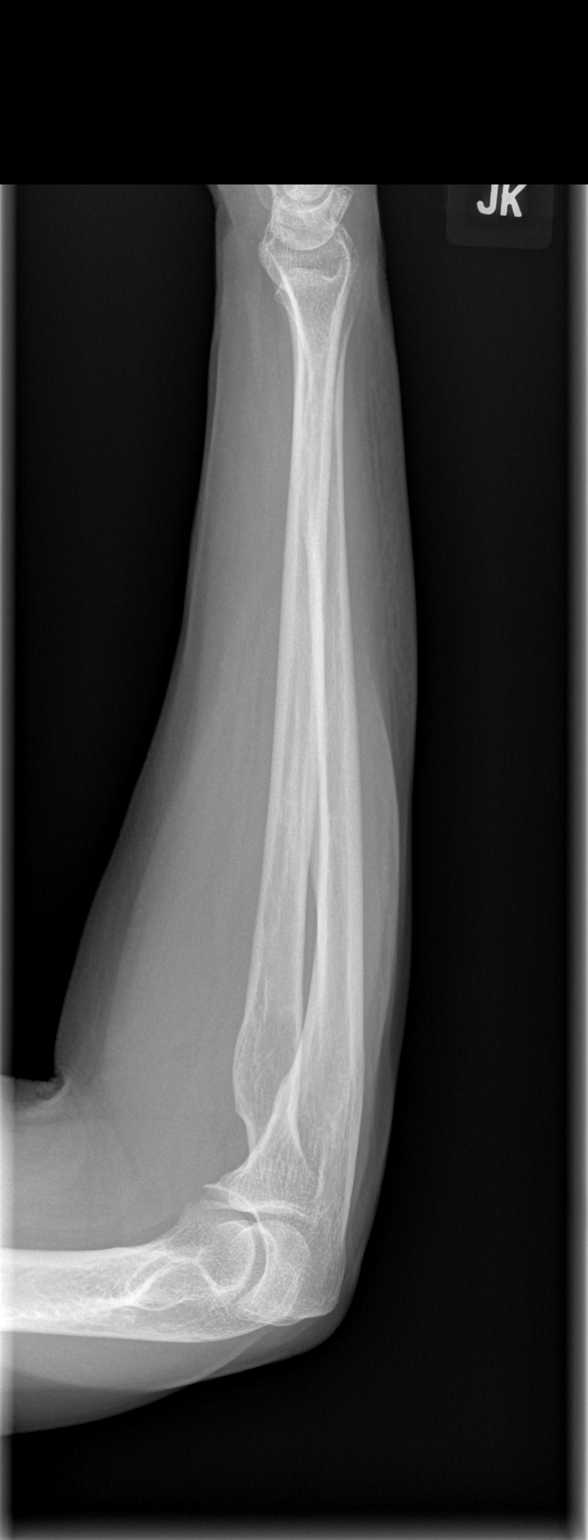

[2 of 2 positions shown; findings below may reference images not displayed]

FINDINGS: The right radius and ulna are adequately mineralized for age. There
is no acute fracture nor dislocation. There is no lytic or blastic
lesion or periosteal reaction. The observed portions of the elbow
and wrist exhibit no acute abnormalities. The soft tissues of the
forearm exhibit no foreign bodies or gas collections
IMPRESSION: There is no acute bony abnormality of the right radius or ulna. The
forearm soft tissues are unremarkable as well.

## 2016-11-26 ENCOUNTER — Emergency Department (HOSPITAL_COMMUNITY)
Admission: EM | Admit: 2016-11-26 | Discharge: 2016-11-26 | Disposition: A | Discharge status: Home / Self Care | Payer: PPO | Attending: Emergency Medicine | Admitting: Emergency Medicine

## 2016-11-26 ENCOUNTER — Emergency Department (HOSPITAL_COMMUNITY): Payer: PPO

## 2016-11-26 ENCOUNTER — Encounter (HOSPITAL_COMMUNITY): Payer: Self-pay

## 2016-11-26 DIAGNOSIS — I2510 Atherosclerotic heart disease of native coronary artery without angina pectoris: Secondary | ICD-10-CM | POA: Diagnosis not present

## 2016-11-26 DIAGNOSIS — Z7982 Long term (current) use of aspirin: Secondary | ICD-10-CM | POA: Insufficient documentation

## 2016-11-26 DIAGNOSIS — Z8673 Personal history of transient ischemic attack (TIA), and cerebral infarction without residual deficits: Secondary | ICD-10-CM | POA: Diagnosis not present

## 2016-11-26 DIAGNOSIS — R0600 Dyspnea, unspecified: Secondary | ICD-10-CM | POA: Diagnosis not present

## 2016-11-26 DIAGNOSIS — R0609 Other forms of dyspnea: Secondary | ICD-10-CM

## 2016-11-26 DIAGNOSIS — Y999 Unspecified external cause status: Secondary | ICD-10-CM | POA: Diagnosis not present

## 2016-11-26 DIAGNOSIS — S0990XA Unspecified injury of head, initial encounter: Secondary | ICD-10-CM | POA: Diagnosis not present

## 2016-11-26 DIAGNOSIS — Y929 Unspecified place or not applicable: Secondary | ICD-10-CM | POA: Diagnosis not present

## 2016-11-26 DIAGNOSIS — W109XXA Fall (on) (from) unspecified stairs and steps, initial encounter: Secondary | ICD-10-CM | POA: Diagnosis not present

## 2016-11-26 DIAGNOSIS — R51 Headache: Secondary | ICD-10-CM | POA: Insufficient documentation

## 2016-11-26 DIAGNOSIS — Z79899 Other long term (current) drug therapy: Secondary | ICD-10-CM | POA: Diagnosis not present

## 2016-11-26 DIAGNOSIS — F17210 Nicotine dependence, cigarettes, uncomplicated: Secondary | ICD-10-CM | POA: Insufficient documentation

## 2016-11-26 DIAGNOSIS — R0602 Shortness of breath: Secondary | ICD-10-CM | POA: Diagnosis not present

## 2016-11-26 DIAGNOSIS — S299XXA Unspecified injury of thorax, initial encounter: Secondary | ICD-10-CM | POA: Diagnosis not present

## 2016-11-26 DIAGNOSIS — Y939 Activity, unspecified: Secondary | ICD-10-CM | POA: Diagnosis not present

## 2016-11-26 LAB — URINALYSIS, ROUTINE W REFLEX MICROSCOPIC
BACTERIA UA: NONE SEEN
Bilirubin Urine: NEGATIVE
GLUCOSE, UA: NEGATIVE mg/dL
HGB URINE DIPSTICK: NEGATIVE
KETONES UR: NEGATIVE mg/dL
NITRITE: NEGATIVE
PH: 7 (ref 5.0–8.0)
PROTEIN: NEGATIVE mg/dL
Specific Gravity, Urine: 1.013 (ref 1.005–1.030)

## 2016-11-26 LAB — BASIC METABOLIC PANEL
Anion gap: 8 (ref 5–15)
BUN: 10 mg/dL (ref 6–20)
CALCIUM: 8.9 mg/dL (ref 8.9–10.3)
CO2: 25 mmol/L (ref 22–32)
Chloride: 105 mmol/L (ref 101–111)
Creatinine, Ser: 0.71 mg/dL (ref 0.44–1.00)
GFR calc non Af Amer: >60 mL/min (ref >60)
GLUCOSE: 88 mg/dL (ref 65–99)
POTASSIUM: 3.8 mmol/L (ref 3.5–5.1)
SODIUM: 138 mmol/L (ref 135–145)

## 2016-11-26 LAB — CBC
HEMATOCRIT: 39.8 % (ref 36.0–46.0)
HEMOGLOBIN: 12.6 g/dL (ref 12.0–15.0)
MCH: 27.9 pg (ref 26.0–34.0)
MCHC: 31.7 g/dL (ref 30.0–36.0)
MCV: 88.1 fL (ref 78.0–100.0)
Platelets: 273 10*3/uL (ref 150–400)
RBC: 4.52 MIL/uL (ref 3.87–5.11)
RDW: 13.4 % (ref 11.5–15.5)
WBC: 9.5 10*3/uL (ref 4.0–10.5)

## 2016-11-26 LAB — CBG MONITORING, ED: Glucose-Capillary: 85 mg/dL (ref 65–99)

## 2016-11-26 LAB — I-STAT TROPONIN, ED
TROPONIN I, POC: 0 ng/mL (ref 0.00–0.08)
Troponin i, poc: 0 ng/mL (ref 0.00–0.08)

## 2016-11-26 MED ORDER — OXYCODONE-ACETAMINOPHEN 5-325 MG PO TABS
1.0000 | ORAL_TABLET | Freq: Once | ORAL | Status: AC
  Administered 2016-11-26: 1 via ORAL
  Filled 2016-11-26: qty 1

## 2016-11-26 MED ORDER — OXYCODONE-ACETAMINOPHEN 5-325 MG PO TABS
1.0000 | ORAL_TABLET | Freq: Once | ORAL | Status: DC
  Filled 2016-11-26: qty 1

## 2016-11-26 MED ORDER — ONDANSETRON 8 MG PO TBDP
8.0000 mg | ORAL_TABLET | Freq: Once | ORAL | Status: AC
  Administered 2016-11-26: 8 mg via ORAL
  Filled 2016-11-26: qty 1

## 2016-11-26 NOTE — Discharge Instructions (Signed)
All tests including CT scan x-ray blood work and urine were okay today. Call Dr. Justin Mend to schedule a follow-up appointment. Take your pain medication as prescribed while at home. Return if concern for any reason

## 2016-11-26 NOTE — ED Notes (Signed)
When giving prescribed OXYCODONE, patient stated "that will not work".

## 2016-11-26 NOTE — ED Triage Notes (Signed)
Patient reports that she fell down some stairs 4 days ago and states she is not sure if she hit or head or not. Patient states she had a hemorrhagic migraine that night and another fall the next day. Today, patient states she has an unsteady gait, weakness, nausea and dizziness today.

## 2016-11-26 NOTE — ED Provider Notes (Signed)
[place] DEPT Provider Note   CSN: [id number] Arrival date & time: 11/26/16  1545     History   Chief Complaint Chief Complaint  Patient presents with  . Fall  . Fatigue  . Dizziness    HPI Dorothy White is a 66 y.o. female.  HPI Patient reports she fell down 10 steps 4 days ago after she had a hemiplegic migraine. 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 Dyspnea has since resolved. She denies any chest pain. She does have a diffuse headache since the fall. Patient has chronically unsteady gait. No treatment prior to coming here and no other associated symptoms denies abdominal pain she does admit to nausea which typically gets with her headaches Past Medical History:  Diagnosis Date  . Anemia    ?  Marland Kitchen Anxiety   . Arthritis    "everywhere" (04/25/2016)  . 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 Chronic back pain    "all over" (04/25/2016)  . Constipation   . Depression   . Diverticulitis   . GERD (gastroesophageal reflux disease)   . Headache    "weekly" (04/25/2016)  . HLD (hyperlipidemia)    hx (04/25/2016)  . 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 Mild CAD    a. 25% mLAD, otherwise no sig disease 01/2016 cath.  Marland Kitchen NICM (nonischemic cardiomyopathy) ([place])    a. EF 40-45% by echo 07/7508 at time of complicated migraine/neuro sx, 55-65% at time of cath 01/2016  . Stroke [facility]) 06/2013   "mini" stroke , ?possibly hemaplegic migraine per pt  Chronic pain  Patient Active Problem List   Diagnosis Date Noted  . Chest pain 09/26/2016  . Diverticulitis of sigmoid colon 04/25/2016  . Infection due to ESBL-producing Escherichia coli/Diverticular Abscess 03/15/2016  . Diverticulitis 03/12/2016  . Chronic pain syndrome 03/12/2016  . Lesion of pancreas 03/12/2016  . History of chest pain 03/12/2016    . Hypokalemia 03/12/2016  . Angina, class IV ([place]) - chest tightness and pressure with dyspnea with minimal exertion 02/17/2016    Class: Question of  . TIA (transient ischemic attack) 12/26/2015  . DOE (dyspnea on exertion) 12/26/2015  . Right sided weakness 12/17/2015  . Ataxia 12/17/2015  . Shoulder fracture 02/15/2015  . Chronic headache 10/11/2013  . Weakness generalized 08/12/2013  . Dizziness and giddiness 08/12/2013  . Abnormality of gait 07/11/2013  . Hyperlipidemia 05/07/2007  . COMMON MIGRAINE 05/07/2007  . PREMATURE VENTRICULAR CONTRACTIONS, FREQUENT 05/07/2007  . GERD 05/07/2007  . DIVERTICULOSIS, COLON 05/07/2007  . 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 OSTEOPENIA 05/07/2007  . Anxiety and depression 03/24/2007  . HEMORRHOIDS 03/24/2007  . ALLERGIC RHINITIS 03/24/2007  . LOW BACK PAIN 03/24/2007  . MIGRAINES, HX OF 03/24/2007    Past Surgical History:  Procedure Laterality Date  . AUGMENTATION MAMMAPLASTY  1980  . BASAL CELL CARCINOMA EXCISION     "tip of my nose"  . BUNIONECTOMY Bilateral 10/2003  . CARDIAC CATHETERIZATION N/A 02/20/2016   Procedure: Left Heart Cath and Coronary Angiography;  Surgeon: Jettie Booze, MD;  Location: [place] [facility];  Service: Cardiovascular;  Laterality: N/A;  . COLONOSCOPY    . DILATION AND CURETTAGE OF UTERUS    . FRACTURE SURGERY    . IR GENERIC HISTORICAL  03/18/2016   IR SINUS/FIST TUBE CHK-NON  GI 03/18/2016 Aletta Edouard, MD [facility]-INTERV RAD  . LAPAROSCOPIC SIGMOID COLECTOMY N/A 04/25/2016   Procedure: LAPAROSCOPIC SIGMOID COLECTOMY;  Surgeon: Stark Klein, MD;  Location: [facility];  Service: General;  Laterality: N/A;  . OOPHORECTOMY Bilateral ~ 1999  . ORIF HUMERUS FRACTURE Right 02/15/2015   Procedure: OPEN REDUCTION INTERNAL FIXATION (ORIF) PROXIMAL HUMERUS FRACTURE;  Surgeon: Netta Cedars, MD;  Location: [place];  Service: Orthopedics;  Laterality: Right;  . RHINOPLASTY  1976  . TONSILLECTOMY    . TUBAL LIGATION   ~ 1983  . VAGINAL HYSTERECTOMY  ~ 1998    OB History    No data available       Home Medications    Prior to Admission medications   Medication Sig Start Date End Date Taking? Authorizing Provider  ARTIFICIAL TEAR OP Apply 1 drop to eye daily as needed (dry eyes).   Yes Historical Provider, MD  Ascorbic Acid (VITAMIN C PO) Take 1 tablet by mouth every evening.   Yes Historical Provider, MD  aspirin EC 81 MG tablet Take 81 mg by mouth daily.    Yes Historical Provider, MD  buPROPion (WELLBUTRIN) 100 MG tablet Take 100 mg by mouth 2 (two) times daily. 11/13/16  Yes Historical Provider, MD  busPIRone (BUSPAR) 5 MG tablet Take 5 mg by mouth 3 (three) times daily. 11/20/16  Yes Historical Provider, MD  Cholecalciferol (VITAMIN D PO) Take 1 capsule by mouth every evening.   Yes Historical Provider, MD  FLUoxetine (PROZAC) 20 MG capsule Take 2 capsules (40 mg total) by mouth daily. 09/27/16  Yes Ripudeep Krystal Eaton, MD  levocetirizine (XYZAL) 5 MG tablet Take 5 mg by mouth every evening.   Yes Historical Provider, MD  LUTEIN PO Take 1 capsule by mouth every evening.   Yes Historical Provider, MD  mometasone (NASONEX) 50 MCG/ACT nasal spray Place 2 sprays into the nose daily.   Yes Historical Provider, MD  oxyCODONE-acetaminophen (PERCOCET/ROXICET) 5-325 MG tablet Take 1-2 tablets by mouth every 4 (four) hours as needed for moderate pain or severe pain. Patient taking differently: Take 1-2 tablets by mouth See admin instructions.  04/30/16  Yes [name], PA-C  RELPAX 40 MG tablet Take 1 tablet by mouth as needed for migraine and may take 1 additional tablet in 2 hours if no relief 01/16/16  Yes Historical Provider, MD  traMADol (ULTRAM) 50 MG tablet Take 50 mg by mouth every 6 (six) hours as needed for pain. 11/13/16  Yes Historical Provider, MD  traZODone (DESYREL) 100 MG tablet Take 200 mg by mouth at bedtime as needed for sleep. 11/12/16  Yes Historical Provider, MD  vitamin E 100 UNIT capsule  Take 100 Units by mouth every evening.   Yes Historical Provider, MD  clonazePAM (KLONOPIN) 1 MG tablet Take 1 tablet (1 mg total) by mouth daily as needed (anxiety/sleep.). Patient not taking: Reported on 11/26/2016 02/15/16   Dorothy Spark, MD  levofloxacin (LEVAQUIN) 500 MG tablet Take 1 tablet (500 mg total) by mouth daily. 11/14/16   Charlann Lange, PA-C  pantoprazole (PROTONIX) 40 MG tablet Take 1 tablet (40 mg total) by mouth daily at 6 (six) AM. Patient not taking: Reported on 11/26/2016 09/28/16   Ripudeep Krystal Eaton, MD  phenazopyridine (PYRIDIUM) 200 MG tablet Take 1 tablet (200 mg total) by mouth 3 (three) times daily. Patient not taking: Reported on 11/26/2016 11/14/16   Charlann Lange, PA-C  zolpidem (AMBIEN) 5 MG tablet Take 1 tablet (5 mg total) by  mouth at bedtime as needed for sleep. Patient not taking: Reported on 11/13/2016 09/27/16   Ripudeep Krystal Eaton, MD    Family History Family History  Problem Relation Age of Onset  . Lung cancer Mother 30  . Heart attack Father     Vague  . Hypertension Brother   . Esophageal cancer Brother   . Diabetes Paternal Grandmother     questionable  . Colon cancer Neg Hx   . Kidney disease Neg Hx   . Liver disease Neg Hx     Social History Social History  Substance Use Topics  . Smoking status: Current Some Day Smoker    Years: 34.00    Types: Cigarettes  . Smokeless tobacco: Never Used     Comment: 04/25/2016 "2 cigarettes/wik sometimes; sometimes nothing for months; real smoker til 2014"  . Alcohol use No     Comment: Denies 08/24/16     Allergies   Penicillins   Review of Systems Review of Systems  Constitutional: Negative.   HENT: Negative.   Respiratory: Positive for shortness of breath.   Cardiovascular: Negative.   Gastrointestinal: Positive for nausea.  Musculoskeletal: Positive for back pain and gait problem.       Chronically unsteady gait. Chronic back pain. Followed in pain clinic  Skin: Negative.   Neurological: Positive  for headaches.  Psychiatric/Behavioral: Negative.   All other systems reviewed and are negative.    Physical Exam Updated Vital Signs BP 138/66 (BP Location: Left Arm)   Pulse 75   Temp 98.4 F (36.9 C) (Oral)   Resp 20   Ht 5\' 5"  (1.651 m)   Wt 120 lb (54.4 kg)   SpO2 97%   BMI 19.97 kg/m   Physical Exam  Constitutional: She is oriented to person, place, and time. She appears well-developed and well-nourished.  HENT:  Head: Normocephalic and atraumatic.  No facial asymmetry  Eyes: Conjunctivae are normal. Pupils are equal, round, and reactive to light.  Neck: Neck supple. No tracheal deviation present. No thyromegaly present.  Cardiovascular: Normal rate, regular rhythm and normal heart sounds.   No murmur heard. Pulmonary/Chest: Effort normal and breath sounds normal.  Abdominal: Soft. Bowel sounds are normal. She exhibits no distension. There is no tenderness.  Musculoskeletal: Normal range of motion. She exhibits no edema or tenderness.  Entire spine nontender. Pelvis stable nontender. All 4 extremities without deformity or point tenderness or swelling. Neurovascular intact  Neurological: She is alert and oriented to person, place, and time. Coordination normal.  Gait normal Romberg normal pronator drift normal finger to nose normal DTR symmetric bilaterally at knee jerk ankle jerk and biceps toes downward going bilaterally  Skin: Skin is warm and dry. No rash noted.  Psychiatric: She has a normal mood and affect.  Nursing note and vitals reviewed.    ED Treatments / Results  Labs (all labs ordered are listed, but only abnormal results are displayed) Labs Reviewed  CBC  BASIC METABOLIC PANEL  URINALYSIS, ROUTINE W REFLEX MICROSCOPIC  CBG MONITORING, ED  I-STAT TROPOININ, ED    EKG  EKG Interpretation  Date/Time:  Wednesday Nov 26 2016 16:19:17 EDT Ventricular Rate:  74 PR Interval:    QRS Duration: 98 QT Interval:  522 QTC Calculation: 580 R  Axis:   61 Text Interpretation:  Sinus rhythm Borderline T abnormalities, anterior leads Prolonged QT interval No STEMI.  No significant change since last tracing Reconfirmed by [facility]  MD, Vasilisa Vore [phone]) on 11/26/2016 4:48:15 PM  Results for orders placed or performed during the hospital encounter of 62/95/28  Basic metabolic panel  Result Value Ref Range   Sodium 138 135 - 145 mmol/L   Potassium 3.8 3.5 - 5.1 mmol/L   Chloride 105 101 - 111 mmol/L   CO2 25 22 - 32 mmol/L   Glucose, Bld 88 65 - 99 mg/dL   BUN 10 6 - 20 mg/dL   Creatinine, Ser 0.71 0.44 - 1.00 mg/dL   Calcium 8.9 8.9 - 10.3 mg/dL   GFR calc non Af Amer >60 >60 mL/min   GFR calc Af Amer >60 >60 mL/min   Anion gap 8 5 - 15  CBC  Result Value Ref Range   WBC 9.5 4.0 - 10.5 K/uL   RBC 4.52 3.87 - 5.11 MIL/uL   Hemoglobin 12.6 12.0 - 15.0 g/dL   HCT 39.8 36.0 - 46.0 %   MCV 88.1 78.0 - 100.0 fL   MCH 27.9 26.0 - 34.0 pg   MCHC 31.7 30.0 - 36.0 g/dL   RDW 13.4 11.5 - 15.5 %   Platelets 273 150 - 400 K/uL  Urinalysis, Routine w reflex microscopic  Result Value Ref Range   Color, Urine YELLOW YELLOW   APPearance CLEAR CLEAR   Specific Gravity, Urine 1.013 1.005 - 1.030   pH 7.0 5.0 - 8.0   Glucose, UA NEGATIVE NEGATIVE mg/dL   Hgb urine dipstick NEGATIVE NEGATIVE   Bilirubin Urine NEGATIVE NEGATIVE   Ketones, ur NEGATIVE NEGATIVE mg/dL   Protein, ur NEGATIVE NEGATIVE mg/dL   Nitrite NEGATIVE NEGATIVE   Leukocytes, UA SMALL (A) NEGATIVE   RBC / HPF 0-5 0 - 5 RBC/hpf   WBC, UA 6-30 0 - 5 WBC/hpf   Bacteria, UA NONE SEEN NONE SEEN   Squamous Epithelial / LPF 0-5 (A) NONE SEEN   Mucous PRESENT    Hyaline Casts, UA PRESENT   CBG monitoring, ED  Result Value Ref Range   Glucose-Capillary 85 65 - 99 mg/dL  I-stat troponin, ED  Result Value Ref Range   Troponin i, poc 0.00 0.00 - 0.08 ng/mL   Comment 3          I-stat troponin, ED  Result Value Ref Range   Troponin i, poc 0.00 0.00 - 0.08 ng/mL   Comment  3           Dg Chest 2 View  Result Date: 11/26/2016 CLINICAL DATA:  [place] down steps 4 days ago. Unsteady gait and weakness. EXAM: CHEST  2 VIEW COMPARISON:  Chest radiograph September 26, 2016 FINDINGS: Cardiomediastinal silhouette is normal. No pleural effusions or focal consolidations. Similarly pulmonary hyperinflation. Trachea projects midline and there is no pneumothorax. Soft tissue planes and included osseous structures are non-suspicious. RIGHT humerus ORIF. Calcified breast implants. IMPRESSION: Similar hyperinflation without acute cardiopulmonary process. Electronically Signed   By: Elon Alas M.D.   On: 11/26/2016 18:00   Ct Head Wo Contrast  Result Date: 11/26/2016 CLINICAL DATA:  Patient reports that she fell down some stairs 4 days ago and states she is not sure if she hit or head or not. Patient states she had a hemorrhagic migraine that night and another fall the next day. Today, patient states she has unsteady gait, weakness, nausea, and dizziness today. EXAM: CT HEAD WITHOUT CONTRAST TECHNIQUE: Contiguous axial images were obtained from the base of the skull through the vertex without intravenous contrast. COMPARISON:  08/25/2016 FINDINGS: Brain: No evidence of acute infarction, hemorrhage, hydrocephalus, extra-axial collection  or mass lesion/mass effect. Vascular: There is mild atherosclerotic calcification of the internal carotid arteries. Skull: Normal. Negative for fracture or focal lesion. Sinuses/Orbits: No acute finding. Other: None IMPRESSION: No evidence for acute  abnormality. Electronically Signed   By: Nolon Nations M.D.   On: 11/26/2016 18:00   Radiology No results found.  Procedures Procedures (including critical care time)  Medications Ordered in ED Medications  oxyCODONE-acetaminophen (PERCOCET/ROXICET) 5-325 MG per tablet 1 tablet (1 tablet Oral Given 11/26/16 1715)  ondansetron (ZOFRAN-ODT) disintegrating tablet 8 mg (8 mg Oral Given 11/26/16 1714)   Chest  x-ray viewed by me.  Initial Impression / Assessment and Plan / ED Course  I have reviewed the triage vital signs and the nursing notes.  Pertinent labs & imaging results that were available during my care of the patient were reviewed by me and considered in my medical decision making (see chart for details).   at 7:15 PM her headache was not well-controlled after treatment with Percocet. Nausea was controlled after treatment with Zofran. She requested more pain medicine. Additional Percocet ordered. At 8:45 PM she is alert Glasgow Coma Score 15 appears in no distress, requesting more pain medicine for headache. Additional Percocet ordered. She does have oxycodone at home which he takes chronically. No prescriptions written.  discharged to home.  follow-up with Dr. Justin Mend. She does have pain medicine at home and is followed with the pain clinic Doubt cardiac etiology of symptoms. Atypical story. Heart score equals 3. Plan follow-up with Dr. Justin Mend Final Clinical Impressions(s) / ED Diagnoses  Diagnosis #1 dyspnea #2 fall 3 headache #4 chronic pain Final diagnoses:  None    New Prescriptions New Prescriptions   No medications on file     Orlie Dakin, MD 11/26/16 2048

## 2016-11-28 DIAGNOSIS — N750 Cyst of Bartholin's gland: Secondary | ICD-10-CM | POA: Diagnosis not present

## 2016-12-01 ENCOUNTER — Emergency Department (HOSPITAL_COMMUNITY)
Admission: EM | Admit: 2016-12-01 | Discharge: 2016-12-01 | Disposition: A | Discharge status: Home / Self Care | Payer: PPO | Attending: Emergency Medicine | Admitting: Emergency Medicine

## 2016-12-01 ENCOUNTER — Encounter (HOSPITAL_COMMUNITY): Payer: Self-pay | Admitting: Emergency Medicine

## 2016-12-01 DIAGNOSIS — I2510 Atherosclerotic heart disease of native coronary artery without angina pectoris: Secondary | ICD-10-CM | POA: Diagnosis not present

## 2016-12-01 DIAGNOSIS — F17210 Nicotine dependence, cigarettes, uncomplicated: Secondary | ICD-10-CM | POA: Diagnosis not present

## 2016-12-01 DIAGNOSIS — Z79899 Other long term (current) drug therapy: Secondary | ICD-10-CM | POA: Insufficient documentation

## 2016-12-01 DIAGNOSIS — N751 Abscess of Bartholin's gland: Secondary | ICD-10-CM | POA: Insufficient documentation

## 2016-12-01 DIAGNOSIS — Z8673 Personal history of transient ischemic attack (TIA), and cerebral infarction without residual deficits: Secondary | ICD-10-CM | POA: Diagnosis not present

## 2016-12-01 DIAGNOSIS — G43809 Other migraine, not intractable, without status migrainosus: Secondary | ICD-10-CM | POA: Diagnosis not present

## 2016-12-01 DIAGNOSIS — Z7982 Long term (current) use of aspirin: Secondary | ICD-10-CM | POA: Diagnosis not present

## 2016-12-01 IMAGING — US US EXTREM  UP VENOUS*R*
1 series · 13 of 24 positions shown · non-contrast
Comparison: None.

CLINICAL DATA: Acute right arm pain and edema for 3 weeks.



[Series 1: us extrem up venous*right* · 0.08mm/px · 13 of 44 slices shown]
[im 1/44]
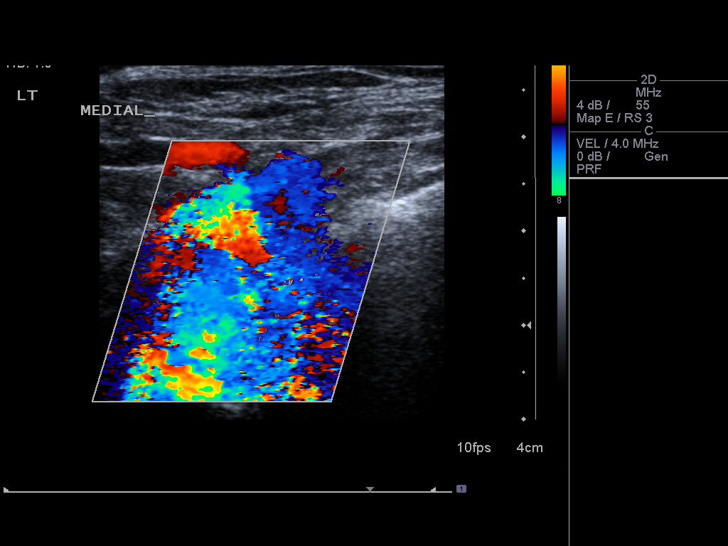
[im 4/44]
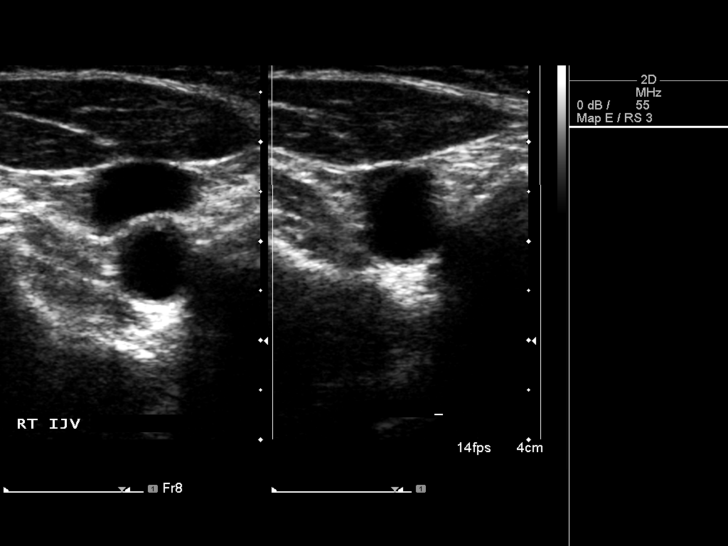
[im 8/44]
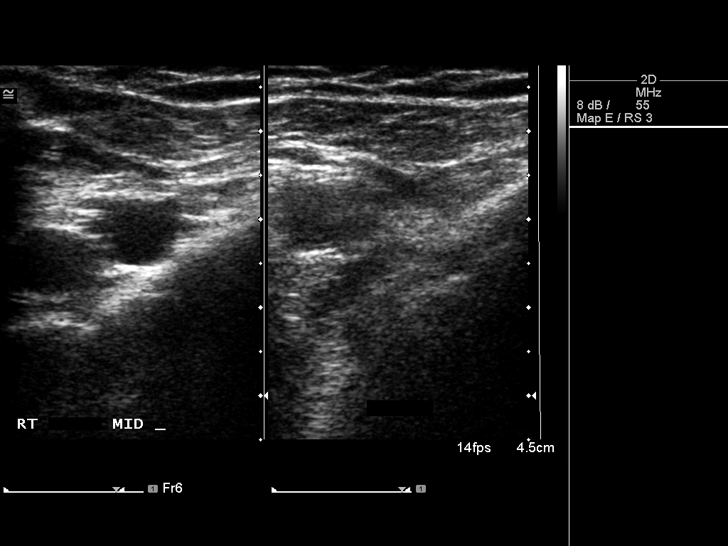
[im 12/44]
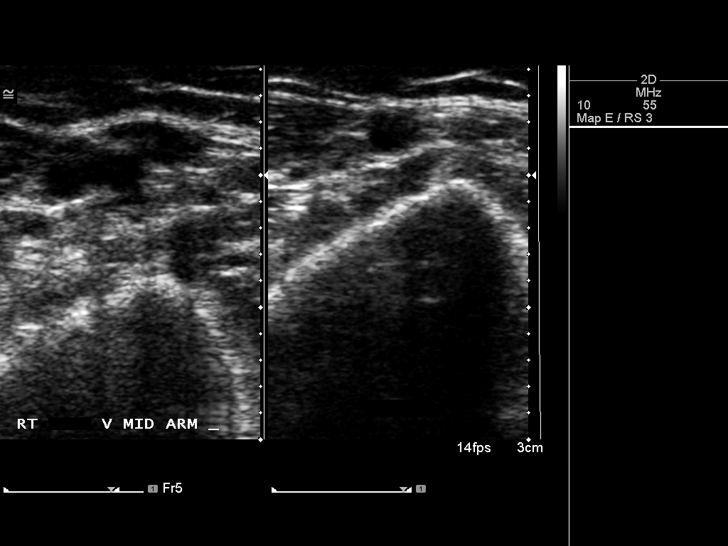
[im 15/44]
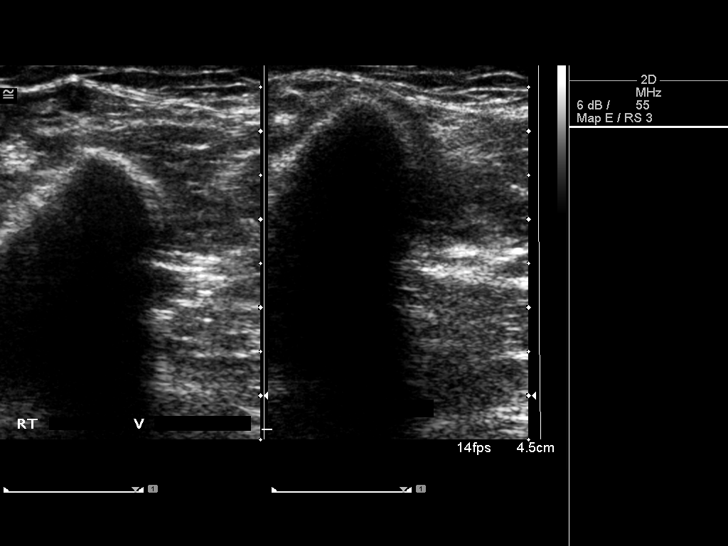
[im 19/44]
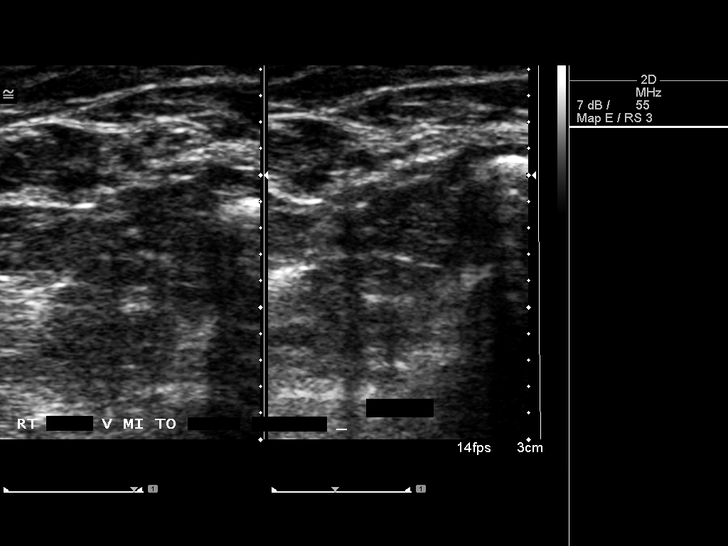
[im 23/44]
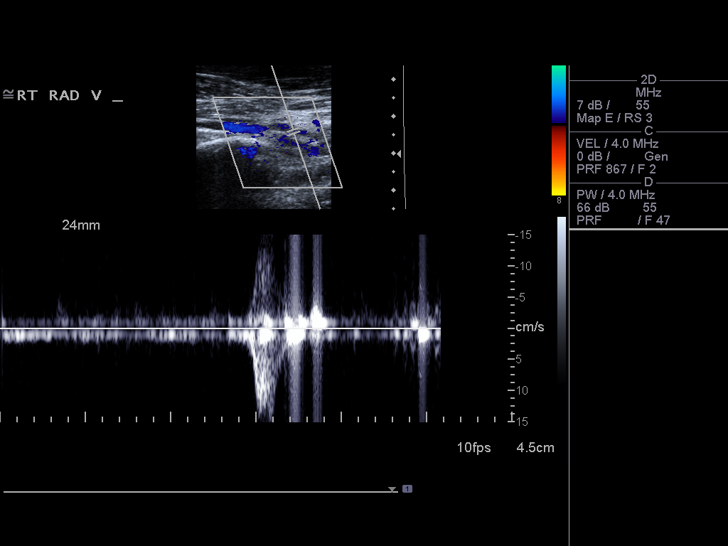
[im 25/44]
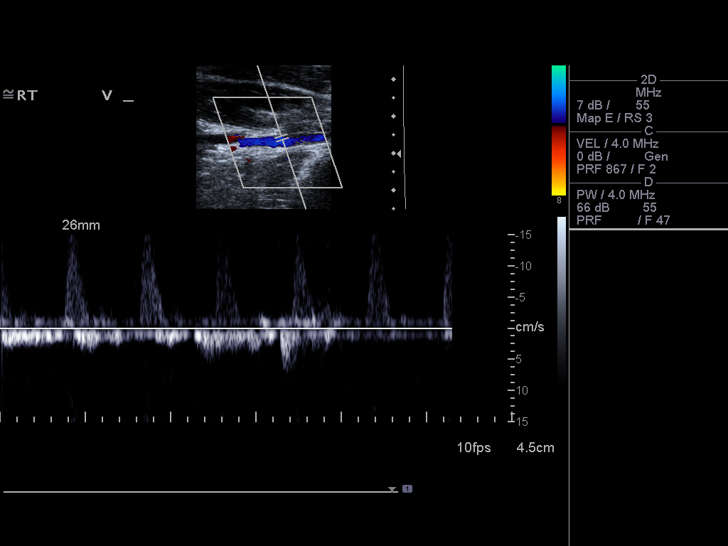
[im 29/44]
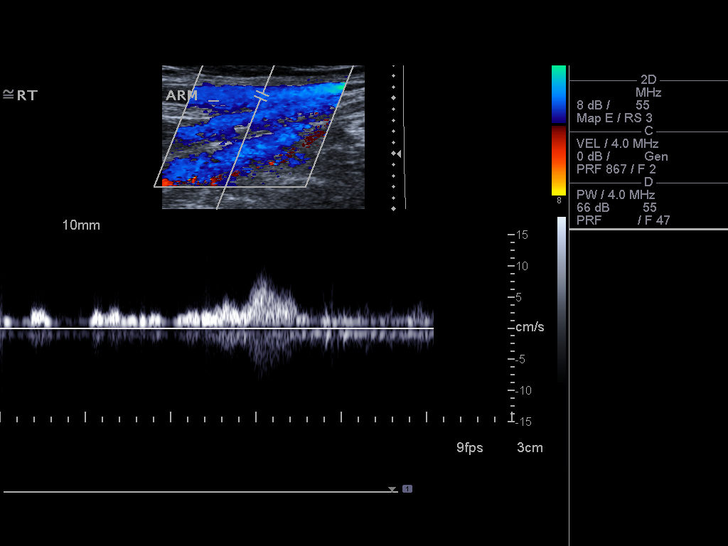
[im 32/44]
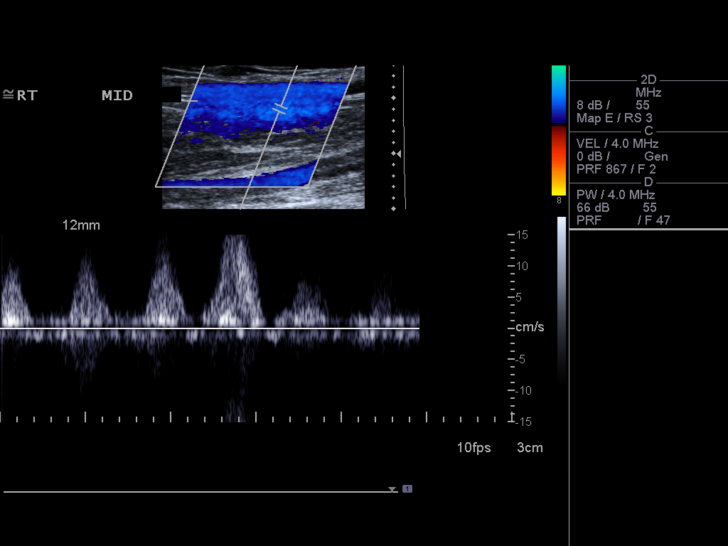
[im 36/44]
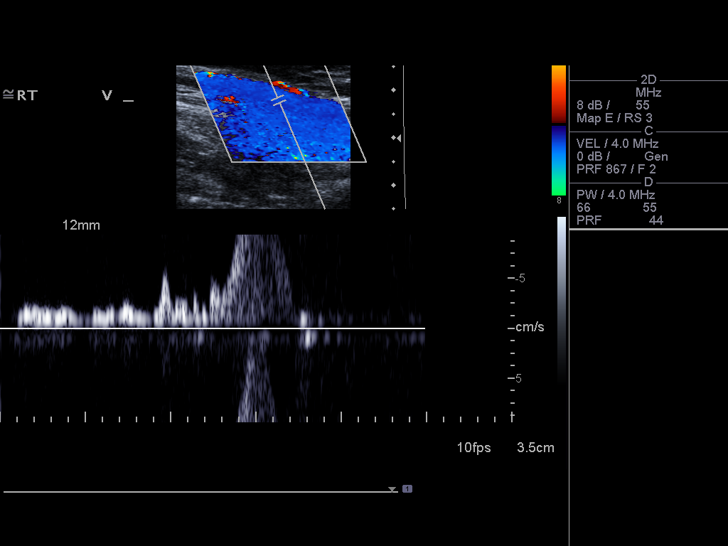
[im 40/44]
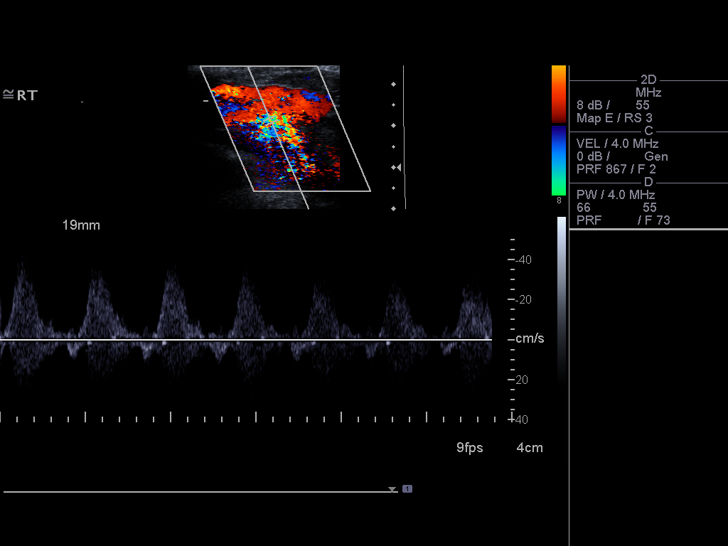
[im 44/44]
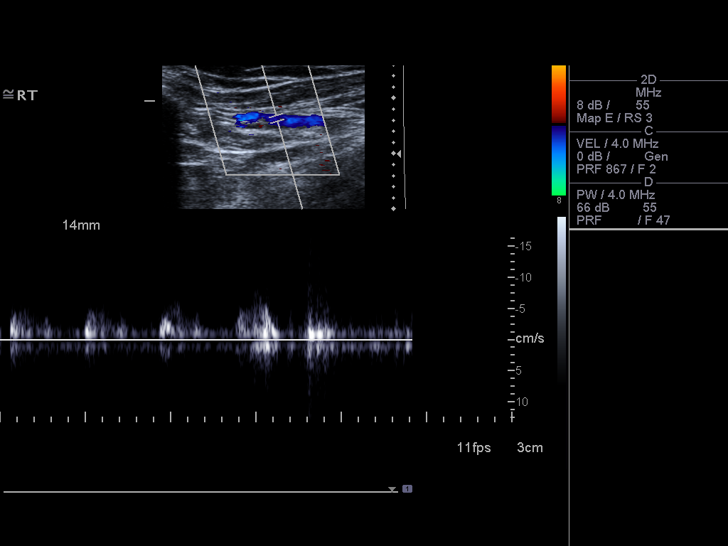

[13 of 24 positions shown; findings below may reference images not displayed]

FINDINGS: Contralateral Subclavian Vein: Respiratory phasicity is normal and
symmetric with the symptomatic side. No evidence of thrombus. Normal
compressibility.

Internal Jugular Vein: No evidence of thrombus. Normal
compressibility, respiratory phasicity and response to augmentation.

Subclavian Vein: No evidence of thrombus. Normal compressibility,
respiratory phasicity and response to augmentation.

Axillary Vein: No evidence of thrombus. Normal compressibility,
respiratory phasicity and response to augmentation.

Cephalic Vein: No evidence of thrombus. Normal compressibility,
respiratory phasicity and response to augmentation.

Basilic Vein: No evidence of thrombus. Normal compressibility,
respiratory phasicity and response to augmentation.

Brachial Veins: No evidence of thrombus. Normal compressibility,
respiratory phasicity and response to augmentation.

Radial Veins: No evidence of thrombus. Normal compressibility,
respiratory phasicity and response to augmentation.

Ulnar Veins: No evidence of thrombus. Normal compressibility,
respiratory phasicity and response to augmentation.

Venous Reflux:  None visualized.

Other Findings:  None visualized.
IMPRESSION: No evidence of deep venous thrombosis.

## 2016-12-01 MED ORDER — PROCHLORPERAZINE EDISYLATE 5 MG/ML IJ SOLN
10.0000 mg | Freq: Once | INTRAMUSCULAR | Status: AC
  Administered 2016-12-01: 10 mg via INTRAVENOUS
  Filled 2016-12-01: qty 2

## 2016-12-01 MED ORDER — SODIUM CHLORIDE 0.9 % IV BOLUS (SEPSIS)
1000.0000 mL | Freq: Once | INTRAVENOUS | Status: AC
  Administered 2016-12-01: 1000 mL via INTRAVENOUS

## 2016-12-01 MED ORDER — KETOROLAC TROMETHAMINE 30 MG/ML IJ SOLN
9.0000 mg | Freq: Once | INTRAMUSCULAR | Status: AC
  Administered 2016-12-01: 9 mg via INTRAVENOUS
  Filled 2016-12-01: qty 1

## 2016-12-01 MED ORDER — MORPHINE SULFATE (PF) 4 MG/ML IV SOLN: 4.0000 mg | Freq: Once | INTRAVENOUS | Status: DC

## 2016-12-01 MED ORDER — DOXYCYCLINE HYCLATE 100 MG PO CAPS: 100.0000 mg | ORAL_CAPSULE | Freq: Two times a day (BID) | ORAL | 0 refills | Status: DC

## 2016-12-01 MED ORDER — DIPHENHYDRAMINE HCL 50 MG/ML IJ SOLN
12.5000 mg | Freq: Once | INTRAMUSCULAR | Status: AC
  Administered 2016-12-01: 12.5 mg via INTRAVENOUS
  Filled 2016-12-01: qty 1

## 2016-12-01 MED ORDER — LIDOCAINE HCL (PF) 1 % IJ SOLN
30.0000 mL | Freq: Once | INTRAMUSCULAR | Status: AC
  Administered 2016-12-01: 30 mL
  Filled 2016-12-01: qty 30

## 2016-12-01 NOTE — ED Triage Notes (Signed)
Pt complaint of worsening pain to Bartholin cyst diagnosed on Friday.

## 2016-12-01 NOTE — ED Notes (Signed)
This RN attempted IV stick x2. Unsuccessful. Charge nurse notified and at bedside.

## 2016-12-01 NOTE — Discharge Instructions (Signed)
Your abscess with strain. Please take antibiotics as prescribed. Patient to drain stays in 4-6 weeks. Would follow-up with the GYN doctor had given your referral. If it falls out may return [facility] the ED [facility] Motrin and Tylenol home for pain. Return if she develops worsening symptoms.

## 2016-12-02 NOTE — ED Provider Notes (Signed)
[place] DEPT Provider Note   CSN: [id number] Arrival date & time: 12/01/16  1310     History   Chief Complaint Chief Complaint  Patient presents with  . Abscess    HPI Dorothy White is a 66 y.o. female.  HPI 66 year old Caucasian female past medical history significant for migraines, anxiety, depression that presents to the emergency department today with multiple complaints. Patient's initial complaint in triage was that she has a worsening pain at her Bartholin's cyst that was diagnosed on Friday. Patient states that she saw her PCP last Friday and was diagnosed with Bartholin's cyst. Encouraged to perform sitz baths and take Motrin for pain. Patient states that the swelling and pain has increased. She denies any associated symptoms including fever, nausea, vomiting. Patient states tender to palpation.  The patient also endorses that since waiting in the lobby she has developed a migraine that was gradual in onset. Describes it as left-sided pulsating pain. Patient has history of migraines and states this feels very similar. Denies any acute or maximal in onset. Endorses mild nausea and photophobia. Denies any lightheadedness, dizziness, chest pain, shortness of breath, vision changes, lightheadedness, dizziness. Past Medical History:  Diagnosis Date  . Anemia    ?  Marland Kitchen Anxiety   . Arthritis    "everywhere" (04/25/2016)  . 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 Chronic back pain    "all over" (04/25/2016)  . Constipation   . Depression   . Diverticulitis   . GERD (gastroesophageal reflux disease)   . Headache    "weekly" (04/25/2016)  . HLD (hyperlipidemia)    hx (04/25/2016)  . 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 Mild CAD    a. 25% mLAD, otherwise no sig disease 01/2016 cath.  Marland Kitchen NICM (nonischemic cardiomyopathy) ([place])    a. EF 40-45% by echo 03/8920 at time of complicated migraine/neuro sx, 55-65% at  time of cath 01/2016  . Stroke [facility]) 06/2013   "mini" stroke , ?possibly hemaplegic migraine per pt    Patient Active Problem List   Diagnosis Date Noted  . Chest pain 09/26/2016  . Diverticulitis of sigmoid colon 04/25/2016  . Infection due to ESBL-producing Escherichia coli/Diverticular Abscess 03/15/2016  . Diverticulitis 03/12/2016  . Chronic pain syndrome 03/12/2016  . Lesion of pancreas 03/12/2016  . History of chest pain 03/12/2016  . Hypokalemia 03/12/2016  . Angina, class IV ([place]) - chest tightness and pressure with dyspnea with minimal exertion 02/17/2016    Class: Question of  . TIA (transient ischemic attack) 12/26/2015  . DOE (dyspnea on exertion) 12/26/2015  . Right sided weakness 12/17/2015  . Ataxia 12/17/2015  . Shoulder fracture 02/15/2015  . Chronic headache 10/11/2013  . Weakness generalized 08/12/2013  . Dizziness and giddiness 08/12/2013  . Abnormality of gait 07/11/2013  . Hyperlipidemia 05/07/2007  . COMMON MIGRAINE 05/07/2007  . PREMATURE VENTRICULAR CONTRACTIONS, FREQUENT 05/07/2007  . GERD 05/07/2007  . DIVERTICULOSIS, COLON 05/07/2007  . 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 OSTEOPENIA 05/07/2007  . Anxiety and depression 03/24/2007  . HEMORRHOIDS 03/24/2007  . ALLERGIC RHINITIS 03/24/2007  . LOW BACK PAIN 03/24/2007  . MIGRAINES, HX OF 03/24/2007    Past Surgical History:  Procedure Laterality Date  . AUGMENTATION MAMMAPLASTY  1980  . BASAL CELL CARCINOMA EXCISION     "tip of my nose"  . BUNIONECTOMY Bilateral 10/2003  . CARDIAC CATHETERIZATION N/A 02/20/2016   Procedure: Left Heart  Cath and Coronary Angiography;  Surgeon: Jettie Booze, MD;  Location: [place] [facility];  Service: Cardiovascular;  Laterality: N/A;  . COLONOSCOPY    . DILATION AND CURETTAGE OF UTERUS    . FRACTURE SURGERY    . IR GENERIC HISTORICAL  03/18/2016   IR SINUS/FIST TUBE CHK-NON GI 03/18/2016 Aletta Edouard, MD [facility]-INTERV RAD  . LAPAROSCOPIC SIGMOID  COLECTOMY N/A 04/25/2016   Procedure: LAPAROSCOPIC SIGMOID COLECTOMY;  Surgeon: Stark Klein, MD;  Location: [place];  Service: General;  Laterality: N/A;  . OOPHORECTOMY Bilateral ~ 1999  . ORIF HUMERUS FRACTURE Right 02/15/2015   Procedure: OPEN REDUCTION INTERNAL FIXATION (ORIF) PROXIMAL HUMERUS FRACTURE;  Surgeon: Netta Cedars, MD;  Location: [place];  Service: Orthopedics;  Laterality: Right;  . RHINOPLASTY  1976  . TONSILLECTOMY    . TUBAL LIGATION  ~ 1983  . VAGINAL HYSTERECTOMY  ~ 1998    OB History    No data available       Home Medications    Prior to Admission medications   Medication Sig Start Date End Date Taking? Authorizing Provider  ARTIFICIAL TEAR OP Apply 1 drop to eye daily as needed (dry eyes).   Yes [provider]  Ascorbic Acid (VITAMIN C PO) Take 1 tablet by mouth every evening.   Yes [provider]  aspirin EC 81 MG tablet Take 81 mg by mouth daily.    Yes [provider]  buPROPion (WELLBUTRIN) 100 MG tablet Take 100 mg by mouth 2 (two) times daily. 11/13/16  Yes [provider]  busPIRone (BUSPAR) 5 MG tablet Take 5 mg by mouth 3 (three) times daily. 11/20/16  Yes [provider]  Cholecalciferol (VITAMIN D PO) Take 1 capsule by mouth every evening.   Yes [provider]  clonazePAM (KLONOPIN) 1 MG tablet Take 1 tablet (1 mg total) by mouth daily as needed (anxiety/sleep.). 02/15/16  Yes Dorothy Spark, MD  FLUoxetine (PROZAC) 20 MG capsule Take 2 capsules (40 mg total) by mouth daily. Patient taking differently: Take 60 mg by mouth daily.  09/27/16  Yes Rai, Ripudeep K, MD  ibuprofen (ADVIL,MOTRIN) 200 MG tablet Take 400-600 mg by mouth every 6 (six) hours as needed for mild pain or moderate pain.   Yes [provider]  levocetirizine (XYZAL) 5 MG tablet Take 5 mg by mouth every evening.   Yes [provider]  LUTEIN PO Take 1 capsule by mouth every evening.   Yes [provider]    mometasone (NASONEX) 50 MCG/ACT nasal spray Place 2 sprays into the nose daily.   Yes [provider]  ondansetron (ZOFRAN) 8 MG tablet Take 8 mg by mouth every 8 (eight) hours as needed for nausea or vomiting.   Yes [provider]  oxyCODONE-acetaminophen (PERCOCET) 10-325 MG tablet Take 1 tablet by mouth every 6 (six) hours as needed for pain.   Yes [provider]  RELPAX 40 MG tablet Take 1 tablet by mouth as needed for migraine and may take 1 additional tablet in 2 hours if no relief 01/16/16  Yes [provider]  traZODone (DESYREL) 100 MG tablet Take 200 mg by mouth at bedtime as needed for sleep. 11/12/16  Yes [provider]  vitamin E 100 UNIT capsule Take 100 Units by mouth every evening.   Yes [provider]  doxycycline (VIBRAMYCIN) 100 MG capsule Take 1 capsule (100 mg total) by mouth 2 (two) times daily. 12/01/16   Elia Keenum,  Zack Seal, PA-C  levofloxacin (LEVAQUIN) 500 MG tablet Take 1 tablet (500 mg total) by mouth daily. Patient not taking: Reported on 12/01/2016 11/14/16   Charlann Lange, PA-C  oxyCODONE-acetaminophen (PERCOCET/ROXICET) 5-325 MG tablet Take 1-2 tablets by mouth every 4 (four) hours as needed for moderate pain or severe pain. Patient not taking: Reported on 12/01/2016 04/30/16   Izora Gala A, PA-C  pantoprazole (PROTONIX) 40 MG tablet Take 1 tablet (40 mg total) by mouth daily at 6 (six) AM. Patient not taking: Reported on 11/26/2016 09/28/16   Rai, Vernelle Emerald, MD  phenazopyridine (PYRIDIUM) 200 MG tablet Take 1 tablet (200 mg total) by mouth 3 (three) times daily. Patient not taking: Reported on 11/26/2016 11/14/16   Charlann Lange, PA-C  zolpidem (AMBIEN) 5 MG tablet Take 1 tablet (5 mg total) by mouth at bedtime as needed for sleep. Patient not taking: Reported on 11/13/2016 09/27/16   Mendel Corning, MD    Family History Family History  Problem Relation Age of Onset  . Lung cancer Mother 71  . Heart attack Father      Vague  . Hypertension Brother   . Esophageal cancer Brother   . Diabetes Paternal Grandmother     questionable  . Colon cancer Neg Hx   . Kidney disease Neg Hx   . Liver disease Neg Hx     Social History Social History  Substance Use Topics  . Smoking status: Current Some Day Smoker    Years: 34.00    Types: Cigarettes  . Smokeless tobacco: Never Used     Comment: 04/25/2016 "2 cigarettes/wik sometimes; sometimes nothing for months; real smoker til 2014"  . Alcohol use No     Comment: Denies 08/24/16     Allergies   Penicillins   Review of Systems Review of Systems  Constitutional: Negative for chills and fever.  Eyes: Positive for photophobia. Negative for visual disturbance.  Respiratory: Negative for cough and shortness of breath.   Cardiovascular: Negative for chest pain.  Gastrointestinal: Positive for nausea. Negative for abdominal pain and vomiting.  Genitourinary: Positive for vaginal pain. Negative for vaginal bleeding and vaginal discharge.  Musculoskeletal: Negative for back pain, neck pain and neck stiffness.  Skin: Negative.   Neurological: Positive for headaches. Negative for dizziness, seizures, syncope, facial asymmetry and light-headedness.     Physical Exam Updated Vital Signs BP 136/85 (BP Location: Right Arm)   Pulse 78   Temp 98.8 F (37.1 C) (Oral)   Resp 18   Ht 5\' 5"  (1.651 m)   Wt 55.4 kg   SpO2 98%   BMI 20.34 kg/m   Physical Exam  Constitutional: She is oriented to person, place, and time. She appears well-developed and well-nourished. No distress.  HENT:  Head: Normocephalic and atraumatic.  Mouth/Throat: Oropharynx is clear and moist.  No temporal tenderness  Eyes: Conjunctivae and EOM are normal. Pupils are equal, round, and reactive to light. Right eye exhibits no discharge. Left eye exhibits no discharge. No scleral icterus.  Neck: Normal range of motion. Neck supple. No thyromegaly present.  No nuchal rigidity.   Cardiovascular: Normal rate, regular rhythm, normal heart sounds and intact distal pulses.  Exam reveals no gallop and no friction rub.   No murmur heard. Pulmonary/Chest: Effort normal and breath sounds normal. No respiratory distress. She has no wheezes.  Abdominal: Soft. Bowel sounds are normal. She exhibits no distension. There is no tenderness. There is no rebound and no guarding.  Genitourinary:  There is tenderness and lesion on the right labia.  Genitourinary Comments: Chaperone present for exam.patient with edema and erythema with area of fluctuance over the Bartholin's gland on the right side. Tender to palpation.  Musculoskeletal: Normal range of motion.  Lymphadenopathy:    She has no cervical adenopathy.  Neurological: She is alert and oriented to person, place, and time.  The patient is alert, attentive, and oriented x 3. Speech is clear. Cranial nerve II-VII grossly intact. Negative pronator drift. Sensation intact. Strength 5/5 in all extremities. Reflexes 2+ and symmetric at biceps, triceps, knees, and ankles. Rapid alternating movement and fine finger movements intact. Romberg is absent. Posture and gait normal.    Skin: Skin is warm and dry.  Nursing note and vitals reviewed.    ED Treatments / Results  Labs (all labs ordered are listed, but only abnormal results are displayed) Labs Reviewed - No data to display  EKG  EKG Interpretation None       Radiology No results found.  Procedures .Marland KitchenIncision and Drainage Date/Time: 12/02/2016 1:17 AM Performed by: Ocie Cornfield T Authorized by: Ocie Cornfield T   Consent:    Consent obtained:  Verbal   Consent given by:  Patient   Risks discussed:  Bleeding, damage to other organs, infection, incomplete drainage and pain   Alternatives discussed:  No treatment Location:    Type:  Bartholin cyst   Size:  3   Location:  Anogenital   Anogenital location:  Bartholin's gland Pre-procedure details:     Skin preparation:  Betadine Anesthesia (see MAR for exact dosages):    Anesthesia method:  Local infiltration   Local anesthetic:  Lidocaine 1% w/o epi Procedure type:    Complexity:  Simple Procedure details:    Incision types:  Single straight   Scalpel blade:  11   Wound management:  Probed and deloculated and irrigated with saline   Drainage:  Bloody and purulent   Drainage amount:  Copious   Wound treatment:  Drain placed   Packing materials:  None Post-procedure details:    Patient tolerance of procedure:  Tolerated well, no immediate complications   (including critical care time)  Medications Ordered in ED Medications  sodium chloride 0.9 % bolus 1,000 mL (0 mLs Intravenous Stopped 12/01/16 1926)  ketorolac (TORADOL) 30 MG/ML injection 9 mg (9 mg Intravenous Given 12/01/16 1747)  prochlorperazine (COMPAZINE) injection 10 mg (10 mg Intravenous Given 12/01/16 1752)  diphenhydrAMINE (BENADRYL) injection 12.5 mg (12.5 mg Intravenous Given 12/01/16 1749)  lidocaine (PF) (XYLOCAINE) 1 % injection 30 mL (30 mLs Infiltration Given 12/01/16 1751)     Initial Impression / Assessment and Plan / ED Course  I have reviewed the triage vital signs and the nursing notes.  Pertinent labs & imaging results that were available during my care of the patient were reviewed by me and considered in my medical decision making (see chart for details).     The patient's presentation consistent with Bartholin's abscess. Patient with Bartholin's abscess amenable to incision and drainage.  Drain was placed,  wound recheck in 2 days. Encouraged home warm soaks and flushing.  Mild signs of cellulitis is surrounding skin.  We'll start on antibiotics given the location.given referral to OB/GYN encouraged to follow up. Given strict return precautions.  Patient also complains of headache that was gradual in onset since being in the ED. Endorses history of same and states this feels very similar. Pt HA treated and  improved while in ED.  Presentation is like pts typical HA and non concerning for [facility], ICH, Meningitis, or temporal arteritis. Pt is afebrile with no focal neuro deficits, nuchal rigidity, or change in vision. Pt is hemodynamically stable, in NAD, & able to ambulate in the ED. Pain has been managed & has no complaints prior to dc. Pt is comfortable with above plan and is stable for discharge at this time. All questions were answered prior to disposition. Strict return precautions for f/u to the ED were discussed.   Patient was discussed and seen by Dr. Leonette Monarch who is agreeable to above plan.    Final Clinical Impressions(s) / ED Diagnoses   Final diagnoses:  Other migraine without status migrainosus, not intractable  Abscess of Bartholin's gland    New Prescriptions Discharge Medication List as of 12/01/2016  7:13 PM    START taking these medications   Details  doxycycline (VIBRAMYCIN) 100 MG capsule Take 1 capsule (100 mg total) by mouth 2 (two) times daily., Starting Mon 12/01/2016, Print         Doristine Devoid, PA-C 12/02/16 0120    Fatima Blank, MD 12/03/16 [phone]

## 2016-12-03 ENCOUNTER — Encounter: Payer: Self-pay | Admitting: Neurology

## 2016-12-05 DIAGNOSIS — N750 Cyst of Bartholin's gland: Secondary | ICD-10-CM | POA: Diagnosis not present

## 2016-12-17 ENCOUNTER — Emergency Department (HOSPITAL_COMMUNITY): Payer: PPO

## 2016-12-17 ENCOUNTER — Emergency Department (HOSPITAL_COMMUNITY)
Admission: EM | Admit: 2016-12-17 | Discharge: 2016-12-18 | Disposition: A | Discharge status: Home / Self Care | Payer: PPO | Attending: Emergency Medicine | Admitting: Emergency Medicine

## 2016-12-17 ENCOUNTER — Encounter (HOSPITAL_COMMUNITY): Payer: Self-pay | Admitting: Nurse Practitioner

## 2016-12-17 DIAGNOSIS — F17210 Nicotine dependence, cigarettes, uncomplicated: Secondary | ICD-10-CM | POA: Insufficient documentation

## 2016-12-17 DIAGNOSIS — F22 Delusional disorders: Secondary | ICD-10-CM | POA: Diagnosis not present

## 2016-12-17 DIAGNOSIS — Z85828 Personal history of other malignant neoplasm of skin: Secondary | ICD-10-CM | POA: Diagnosis not present

## 2016-12-17 DIAGNOSIS — Z79899 Other long term (current) drug therapy: Secondary | ICD-10-CM | POA: Diagnosis not present

## 2016-12-17 DIAGNOSIS — R531 Weakness: Secondary | ICD-10-CM | POA: Diagnosis not present

## 2016-12-17 DIAGNOSIS — Z7982 Long term (current) use of aspirin: Secondary | ICD-10-CM | POA: Insufficient documentation

## 2016-12-17 DIAGNOSIS — F419 Anxiety disorder, unspecified: Secondary | ICD-10-CM | POA: Diagnosis not present

## 2016-12-17 DIAGNOSIS — G43109 Migraine with aura, not intractable, without status migrainosus: Secondary | ICD-10-CM

## 2016-12-17 DIAGNOSIS — Z8673 Personal history of transient ischemic attack (TIA), and cerebral infarction without residual deficits: Secondary | ICD-10-CM | POA: Diagnosis not present

## 2016-12-17 DIAGNOSIS — G43909 Migraine, unspecified, not intractable, without status migrainosus: Secondary | ICD-10-CM | POA: Diagnosis not present

## 2016-12-17 DIAGNOSIS — R791 Abnormal coagulation profile: Secondary | ICD-10-CM | POA: Diagnosis not present

## 2016-12-17 DIAGNOSIS — R51 Headache: Secondary | ICD-10-CM | POA: Diagnosis not present

## 2016-12-17 LAB — URINALYSIS, ROUTINE W REFLEX MICROSCOPIC
BILIRUBIN URINE: NEGATIVE
Glucose, UA: NEGATIVE mg/dL
HGB URINE DIPSTICK: NEGATIVE
Ketones, ur: NEGATIVE mg/dL
Leukocytes, UA: NEGATIVE
Nitrite: NEGATIVE
PH: 7 (ref 5.0–8.0)
Protein, ur: NEGATIVE mg/dL
SPECIFIC GRAVITY, URINE: 1.013 (ref 1.005–1.030)

## 2016-12-17 LAB — APTT: APTT: 28 s (ref 24–36)

## 2016-12-17 LAB — CBC
HCT: 39.3 % (ref 36.0–46.0)
Hemoglobin: 12.2 g/dL (ref 12.0–15.0)
MCH: 27.7 pg (ref 26.0–34.0)
MCHC: 31 g/dL (ref 30.0–36.0)
MCV: 89.1 fL (ref 78.0–100.0)
PLATELETS: 209 10*3/uL (ref 150–400)
RBC: 4.41 MIL/uL (ref 3.87–5.11)
RDW: 14 % (ref 11.5–15.5)
WBC: 5.4 10*3/uL (ref 4.0–10.5)

## 2016-12-17 LAB — RAPID URINE DRUG SCREEN, HOSP PERFORMED
AMPHETAMINES: NOT DETECTED
BENZODIAZEPINES: POSITIVE — AB
Barbiturates: NOT DETECTED
Cocaine: NOT DETECTED
Opiates: NOT DETECTED
TETRAHYDROCANNABINOL: NOT DETECTED

## 2016-12-17 LAB — COMPREHENSIVE METABOLIC PANEL
ALBUMIN: 3.5 g/dL (ref 3.5–5.0)
ALT: 17 U/L (ref 14–54)
AST: 20 U/L (ref 15–41)
Alkaline Phosphatase: 89 U/L (ref 38–126)
Anion gap: 9 (ref 5–15)
BUN: <5 mg/dL — ABNORMAL LOW (ref 6–20)
CHLORIDE: 106 mmol/L (ref 101–111)
CO2: 25 mmol/L (ref 22–32)
CREATININE: 0.71 mg/dL (ref 0.44–1.00)
Calcium: 9 mg/dL (ref 8.9–10.3)
GFR calc non Af Amer: >60 mL/min (ref >60)
GLUCOSE: 87 mg/dL (ref 65–99)
Potassium: 3.8 mmol/L (ref 3.5–5.1)
SODIUM: 140 mmol/L (ref 135–145)
Total Bilirubin: 0.5 mg/dL (ref 0.3–1.2)
Total Protein: 6.6 g/dL (ref 6.5–8.1)

## 2016-12-17 LAB — DIFFERENTIAL
BASOS ABS: 0 10*3/uL (ref 0.0–0.1)
BASOS PCT: 0 %
Eosinophils Absolute: 0.5 10*3/uL (ref 0.0–0.7)
Eosinophils Relative: 10 %
Lymphocytes Relative: 25 %
Lymphs Abs: 1.4 10*3/uL (ref 0.7–4.0)
MONO ABS: 0.6 10*3/uL (ref 0.1–1.0)
Monocytes Relative: 10 %
NEUTROS ABS: 3 10*3/uL (ref 1.7–7.7)
NEUTROS PCT: 55 %

## 2016-12-17 LAB — I-STAT CHEM 8, ED
BUN: 4 mg/dL — AB (ref 6–20)
CHLORIDE: 105 mmol/L (ref 101–111)
Calcium, Ion: 1.14 mmol/L — ABNORMAL LOW (ref 1.15–1.40)
Creatinine, Ser: 0.7 mg/dL (ref 0.44–1.00)
Glucose, Bld: 83 mg/dL (ref 65–99)
HCT: 39 % (ref 36.0–46.0)
Hemoglobin: 13.3 g/dL (ref 12.0–15.0)
POTASSIUM: 3.8 mmol/L (ref 3.5–5.1)
SODIUM: 144 mmol/L (ref 135–145)
TCO2: 26 mmol/L (ref 0–100)

## 2016-12-17 LAB — PROTIME-INR
INR: 1.04
PROTHROMBIN TIME: 13.6 s (ref 11.4–15.2)

## 2016-12-17 LAB — ETHANOL

## 2016-12-17 LAB — I-STAT TROPONIN, ED: Troponin i, poc: 0 ng/mL (ref 0.00–0.08)

## 2016-12-17 MED ORDER — DIPHENHYDRAMINE HCL 50 MG/ML IJ SOLN
25.0000 mg | Freq: Once | INTRAMUSCULAR | Status: AC
  Administered 2016-12-17: 25 mg via INTRAVENOUS
  Filled 2016-12-17: qty 1

## 2016-12-17 MED ORDER — MORPHINE SULFATE (PF) 4 MG/ML IV SOLN
4.0000 mg | Freq: Once | INTRAVENOUS | Status: AC
  Administered 2016-12-17: 4 mg via INTRAVENOUS
  Filled 2016-12-17: qty 1

## 2016-12-17 MED ORDER — SODIUM CHLORIDE 0.9 % IV BOLUS (SEPSIS)
1000.0000 mL | Freq: Once | INTRAVENOUS | Status: AC
  Administered 2016-12-17: 1000 mL via INTRAVENOUS

## 2016-12-17 MED ORDER — PROCHLORPERAZINE EDISYLATE 5 MG/ML IJ SOLN
10.0000 mg | Freq: Once | INTRAMUSCULAR | Status: AC
  Administered 2016-12-17: 10 mg via INTRAVENOUS
  Filled 2016-12-17: qty 2

## 2016-12-17 NOTE — ED Provider Notes (Signed)
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 She went to sleep at about 10 PM, which is her last known normal. She did not develop a headache until approximately 5 PM. Headache is left-sided and described as sharp and throbbing. She rates it at 8/10. There is no visual change. There was mild nausea but no vomiting. On exam, she is resting comfortably and in no acute distress. Pupils are equal and reactive. Fundi show no hemorrhage or exudate. There is no pronator drift, but there is very slight weakness of the right arm compared with the left. There is also very mild weakness of the right leg compared with the left with strength 4+/5. She says that migraine cocktails have not helped in the past.  Her history seems somewhat atypical for hemiplegic migraine. However, she is being given a migraine cocktail to see if there is improvement. Head CT has come back negative. If this is a stroke, she is well outside the window for thrombolytic therapy, and her NIH stroke scale is too low to warrant interventional radiology. At this point, if she does not improve with migraine cocktail, will proceed with MRI scan to rule out stroke. On review of old records, she actually had a presentation somewhat similar to this one year ago and was eventually diagnosed with complicated migraine.  She did not get adequate relief with migraine cocktail, so was given a dose of morphine. I have reviewed the MRI and do not see any evidence of stroke, but final result is still pending. Patient is sleeping comfortably at this point.  Official MRI report shows no acute changes. Patient is discharged with outpatient referral to neurology.  Medical screening examination/treatment/procedure(s) were conducted as a shared visit with non-physician practitioner(s) and myself.  I personally evaluated the patient during the encounter.   Results  for orders placed or performed during the hospital encounter of 12/17/16  Ethanol  Result Value Ref Range   Alcohol, Ethyl (B) <5 <5 mg/dL  Protime-INR  Result Value Ref Range   Prothrombin Time 13.6 11.4 - 15.2 seconds   INR 1.04   APTT  Result Value Ref Range   aPTT 28 24 - 36 seconds  CBC  Result Value Ref Range   WBC 5.4 4.0 - 10.5 K/uL   RBC 4.41 3.87 - 5.11 MIL/uL   Hemoglobin 12.2 12.0 - 15.0 g/dL   HCT 39.3 36.0 - 46.0 %   MCV 89.1 78.0 - 100.0 fL   MCH 27.7 26.0 - 34.0 pg   MCHC 31.0 30.0 - 36.0 g/dL   RDW 14.0 11.5 - 15.5 %   Platelets 209 150 - 400 K/uL  Differential  Result Value Ref Range   Neutrophils Relative % 55 %   Neutro Abs 3.0 1.7 - 7.7 K/uL   Lymphocytes Relative 25 %   Lymphs Abs 1.4 0.7 - 4.0 K/uL   Monocytes Relative 10 %   Monocytes Absolute 0.6 0.1 - 1.0 K/uL   Eosinophils Relative 10 %   Eosinophils Absolute 0.5 0.0 - 0.7 K/uL   Basophils Relative 0 %   Basophils Absolute 0.0 0.0 - 0.1 K/uL  Comprehensive metabolic panel  Result Value Ref Range   Sodium 140 135 - 145 mmol/L   Potassium 3.8 3.5 - 5.1 mmol/L   Chloride 106 101 - 111 mmol/L   CO2 25 22 - 32 mmol/L   Glucose, Bld 87 65 -  99 mg/dL   BUN <5 (L) 6 - 20 mg/dL   Creatinine, Ser 0.71 0.44 - 1.00 mg/dL   Calcium 9.0 8.9 - 10.3 mg/dL   Total Protein 6.6 6.5 - 8.1 g/dL   Albumin 3.5 3.5 - 5.0 g/dL   AST 20 15 - 41 U/L   ALT 17 14 - 54 U/L   Alkaline Phosphatase 89 38 - 126 U/L   Total Bilirubin 0.5 0.3 - 1.2 mg/dL   GFR calc non Af Amer >60 >60 mL/min   GFR calc Af Amer >60 >60 mL/min   Anion gap 9 5 - 15  Urine rapid drug screen (hosp performed)not at [facility]  Result Value Ref Range   Opiates NONE DETECTED NONE DETECTED   Cocaine NONE DETECTED NONE DETECTED   Benzodiazepines POSITIVE (A) NONE DETECTED   Amphetamines NONE DETECTED NONE DETECTED   Tetrahydrocannabinol NONE DETECTED NONE DETECTED   Barbiturates NONE DETECTED NONE DETECTED  Urinalysis, Routine w reflex microscopic   Result Value Ref Range   Color, Urine YELLOW YELLOW   APPearance CLEAR CLEAR   Specific Gravity, Urine 1.013 1.005 - 1.030   pH 7.0 5.0 - 8.0   Glucose, UA NEGATIVE NEGATIVE mg/dL   Hgb urine dipstick NEGATIVE NEGATIVE   Bilirubin Urine NEGATIVE NEGATIVE   Ketones, ur NEGATIVE NEGATIVE mg/dL   Protein, ur NEGATIVE NEGATIVE mg/dL   Nitrite NEGATIVE NEGATIVE   Leukocytes, UA NEGATIVE NEGATIVE  I-Stat Chem 8, ED  (not at [facility], [facility])  Result Value Ref Range   Sodium 144 135 - 145 mmol/L   Potassium 3.8 3.5 - 5.1 mmol/L   Chloride 105 101 - 111 mmol/L   BUN 4 (L) 6 - 20 mg/dL   Creatinine, Ser 0.70 0.44 - 1.00 mg/dL   Glucose, Bld 83 65 - 99 mg/dL   Calcium, Ion 1.14 (L) 1.15 - 1.40 mmol/L   TCO2 26 0 - 100 mmol/L   Hemoglobin 13.3 12.0 - 15.0 g/dL   HCT 39.0 36.0 - 46.0 %  I-stat troponin, ED (not at [facility], [facility])  Result Value Ref Range   Troponin i, poc 0.00 0.00 - 0.08 ng/mL   Comment 3           Dg Chest 2 View  Result Date: 11/26/2016 CLINICAL DATA:  [place] down steps 4 days ago. Unsteady gait and weakness. EXAM: CHEST  2 VIEW COMPARISON:  Chest radiograph September 26, 2016 FINDINGS: Cardiomediastinal silhouette is normal. No pleural effusions or focal consolidations. Similarly pulmonary hyperinflation. Trachea projects midline and there is no pneumothorax. Soft tissue planes and included osseous structures are non-suspicious. RIGHT humerus ORIF. Calcified breast implants. IMPRESSION: Similar hyperinflation without acute cardiopulmonary process. Electronically Signed   By: Elon Alas M.D.   On: 11/26/2016 18:00   Ct Head Wo Contrast  Result Date: 12/17/2016 CLINICAL DATA:  66 y/o F; woke this morning with migraine and right-sided weakness. EXAM: CT HEAD WITHOUT CONTRAST TECHNIQUE: Contiguous axial images were obtained from the base of the skull through the vertex without intravenous contrast. COMPARISON:  11/26/2016 CT head. FINDINGS: Brain: No evidence of acute infarction,  hemorrhage, hydrocephalus, extra-axial collection or mass lesion/mass effect. Vascular: Mild calcific atherosclerosis of carotid siphons. Skull: Normal. Negative for fracture or focal lesion. Sinuses/Orbits: Small left maxillary sinus mucous retention cyst. Otherwise negative. Other: None. IMPRESSION: No acute intracranial abnormality identified. Unremarkable CT of the head. Electronically Signed   By: Kristine Garbe M.D.   On: 12/17/2016 20:02   Ct Head Wo Contrast  Result Date: 11/26/2016 CLINICAL DATA:  Patient reports that she fell down some stairs 4 days ago and states she is not sure if she hit or head or not. Patient states she had a hemorrhagic migraine that night and another fall the next day. Today, patient states she has unsteady gait, weakness, nausea, and dizziness today. EXAM: CT HEAD WITHOUT CONTRAST TECHNIQUE: Contiguous axial images were obtained from the base of the skull through the vertex without intravenous contrast. COMPARISON:  08/25/2016 FINDINGS: Brain: No evidence of acute infarction, hemorrhage, hydrocephalus, extra-axial collection or mass lesion/mass effect. Vascular: There is mild atherosclerotic calcification of the internal carotid arteries. Skull: Normal. Negative for fracture or focal lesion. Sinuses/Orbits: No acute finding. Other: None IMPRESSION: No evidence for acute  abnormality. Electronically Signed   By: Nolon Nations M.D.   On: 11/26/2016 18:00   Mr Brain Wo Contrast  Result Date: 12/18/2016 CLINICAL DATA:  Initial evaluation for acute headache, right hemi paresis. EXAM: MRI HEAD WITHOUT CONTRAST TECHNIQUE: Multiplanar, multiecho pulse sequences of the brain and surrounding structures were obtained without intravenous contrast. COMPARISON:  Prior CT from earlier the same day. Comparison also made with prior MRI from 12/20/2015 FINDINGS: Brain: Age-appropriate cerebral atrophy. Unchanged mild cerebral white matter disease, nonspecific. No abnormal foci of  restricted diffusion to suggest acute or subacute ischemia. Gray-white matter differentiation maintained. No areas of chronic infarction identified. No evidence for acute or chronic intracranial hemorrhage. No mass lesion, midline shift or mass effect. No hydrocephalus. No extra-axial fluid collection. Major dural sinuses are grossly patent. Pituitary gland suprasellar region within normal limits. Midline structures intact and normal. Vascular: Major intracranial vascular flow voids are maintained. Skull and upper cervical spine: Craniocervical junction within normal limits. Visualized upper cervical spine unremarkable. Bone marrow signal intensity within normal limits. No scalp soft tissue abnormality. Sinuses/Orbits: Globes and oval soft tissues within normal limits. Scattered mucosal thickening within the paranasal sinuses. No air-fluid level to suggest active sinus infection. No mastoid effusion. Inner ear structures normal. Other: None. IMPRESSION: 1. No acute intracranial infarct or other process identified. 2. Unchanged mild cerebral white matter disease, nonspecific. Electronically Signed   By: Jeannine Boga M.D.   On: 67/61/9509 32:67       Delora Fuel, MD 12/45/80 [phone]

## 2016-12-17 NOTE — ED Triage Notes (Addendum)
Pt presents with c/o anxiety. She has a history of anxiety but the anxiety has been worse this week after the death of two close friends. She has been taking her anxiety medicine as prescribed. She is tearful in triage and says she feels that she is unable to get her anxiety under control

## 2016-12-17 NOTE — ED Provider Notes (Signed)
[facility] DEPT Provider Note   CSN: [id number] Arrival date & time: 12/17/16  1814  By signing my name below, I, Neta Mends, attest that this documentation has been prepared under the direction and in the presence of Martinique Russo, [place]. Electronically Signed: Neta Mends, ED Scribe. 12/17/2016. 7:10 PM.    History   Chief Complaint Chief Complaint  Patient presents with  . Anxiety   The history is provided by the patient. No language interpreter was used.   HPI Comments:  Dorothy White is a 66 y.o. female with PMHx of stroke and complicated migraines, presents to the Emergency Department complaining of worsening anxiety of the past week. She states that the anxiety has worsened after the recent death of 2 friends. She states that this AM at 0630 she woke up with a migraine, and states that she has had similar headaches over the last 2 years, most recently 1 month ago. She reports that she normally has weakness/paralysis following this type of migraine, however states this one feels different than her usual; states her "feet don't work." Reports she has had right leg paralysis for 1-2 weeks, and worse since this morning. She expresses concern that she is having another stroke. Pt has been taking klonopin 2 times daily. Denies SI/HI, numbness, tingling, visual disturbance, slurred speech, chest pain, shortness of breath, fever.   Workup for similar headache and weakness about 1 year ago with neg MRI of head, diagnosed as complicated migraine.  Past Medical History:  Diagnosis Date  . Anemia    ?  Marland Kitchen Anxiety   . Arthritis    "everywhere" (04/25/2016)  . 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 Chronic back pain    "all over" (04/25/2016)  . Constipation   . Depression   . Diverticulitis   . GERD (gastroesophageal reflux disease)   . Headache    "weekly" (04/25/2016)  . HLD (hyperlipidemia)    hx (04/25/2016)  . 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 Mild CAD    a. 25% mLAD, otherwise no sig disease 01/2016 cath.  Marland Kitchen NICM (nonischemic cardiomyopathy) ([place])    a. EF 40-45% by echo 0/9323 at time of complicated migraine/neuro sx, 55-65% at time of cath 01/2016  . Stroke [facility]) 06/2013   "mini" stroke , ?possibly hemaplegic migraine per pt    Patient Active Problem List   Diagnosis Date Noted  . Chest pain 09/26/2016  . Diverticulitis of sigmoid colon 04/25/2016  . Infection due to ESBL-producing Escherichia coli/Diverticular Abscess 03/15/2016  . Diverticulitis 03/12/2016  . Chronic pain syndrome 03/12/2016  . Lesion of pancreas 03/12/2016  . History of chest pain 03/12/2016  . Hypokalemia 03/12/2016  . Angina, class IV ([place]) - chest tightness and pressure with dyspnea with minimal exertion 02/17/2016    Class: Question of  . TIA (transient ischemic attack) 12/26/2015  . DOE (dyspnea on exertion) 12/26/2015  . Right sided weakness 12/17/2015  . Ataxia 12/17/2015  . Shoulder fracture 02/15/2015  . Chronic headache 10/11/2013  . Weakness generalized 08/12/2013  . Dizziness and giddiness 08/12/2013  . Abnormality of gait 07/11/2013  . Hyperlipidemia 05/07/2007  . COMMON MIGRAINE 05/07/2007  . PREMATURE VENTRICULAR CONTRACTIONS, FREQUENT 05/07/2007  . GERD 05/07/2007  . DIVERTICULOSIS, COLON 05/07/2007  . 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 OSTEOPENIA 05/07/2007  . Anxiety and depression 03/24/2007  . HEMORRHOIDS 03/24/2007  . ALLERGIC RHINITIS 03/24/2007  .  LOW BACK PAIN 03/24/2007  . MIGRAINES, HX OF 03/24/2007    Past Surgical History:  Procedure Laterality Date  . AUGMENTATION MAMMAPLASTY  1980  . BASAL CELL CARCINOMA EXCISION     "tip of my nose"  . BUNIONECTOMY Bilateral 10/2003  . CARDIAC CATHETERIZATION N/A 02/20/2016   Procedure: Left Heart Cath and Coronary Angiography;  Surgeon: Jettie Booze, MD;  Location: [facility];  Service:  Cardiovascular;  Laterality: N/A;  . COLONOSCOPY    . DILATION AND CURETTAGE OF UTERUS    . FRACTURE SURGERY    . IR GENERIC HISTORICAL  03/18/2016   IR SINUS/FIST TUBE CHK-NON GI 03/18/2016 Aletta Edouard, MD [facility]-INTERV RAD  . LAPAROSCOPIC SIGMOID COLECTOMY N/A 04/25/2016   Procedure: LAPAROSCOPIC SIGMOID COLECTOMY;  Surgeon: Stark Klein, MD;  Location: [place];  Service: General;  Laterality: N/A;  . OOPHORECTOMY Bilateral ~ 1999  . ORIF HUMERUS FRACTURE Right 02/15/2015   Procedure: OPEN REDUCTION INTERNAL FIXATION (ORIF) PROXIMAL HUMERUS FRACTURE;  Surgeon: Netta Cedars, MD;  Location: [facility];  Service: Orthopedics;  Laterality: Right;  . RHINOPLASTY  1976  . TONSILLECTOMY    . TUBAL LIGATION  ~ 1983  . VAGINAL HYSTERECTOMY  ~ 1998    OB History    No data available       Home Medications    Prior to Admission medications   Medication Sig Start Date End Date Taking? Authorizing Provider  ARTIFICIAL TEAR OP Apply 1 drop to eye daily as needed (dry eyes).    [provider]  Ascorbic Acid (VITAMIN C PO) Take 1 tablet by mouth every evening.    [provider]  aspirin EC 81 MG tablet Take 81 mg by mouth daily.     [provider]  buPROPion (WELLBUTRIN) 100 MG tablet Take 100 mg by mouth 2 (two) times daily. 11/13/16   [provider]  busPIRone (BUSPAR) 5 MG tablet Take 5 mg by mouth 3 (three) times daily. 11/20/16   [provider]  Cholecalciferol (VITAMIN D PO) Take 1 capsule by mouth every evening.    [provider]  clonazePAM (KLONOPIN) 1 MG tablet Take 1 tablet (1 mg total) by mouth daily as needed (anxiety/sleep.). 02/15/16   Dorothy Spark, MD  doxycycline (VIBRAMYCIN) 100 MG capsule Take 1 capsule (100 mg total) by mouth 2 (two) times daily. 12/01/16   Doristine Devoid, PA-C  FLUoxetine (PROZAC) 20 MG capsule Take 2 capsules (40 mg total) by mouth daily. Patient taking differently: Take 60 mg by mouth daily.  09/27/16    Rai, Vernelle Emerald, MD  ibuprofen (ADVIL,MOTRIN) 200 MG tablet Take 400-600 mg by mouth every 6 (six) hours as needed for mild pain or moderate pain.    [provider]  levocetirizine (XYZAL) 5 MG tablet Take 5 mg by mouth every evening.    [provider]  levofloxacin (LEVAQUIN) 500 MG tablet Take 1 tablet (500 mg total) by mouth daily. Patient not taking: Reported on 12/01/2016 11/14/16   Charlann Lange, PA-C  LUTEIN PO Take 1 capsule by mouth every evening.    [provider]  mometasone (NASONEX) 50 MCG/ACT nasal spray Place 2 sprays into the nose daily.    [provider]  ondansetron (ZOFRAN) 8 MG tablet Take 8 mg by mouth every 8 (eight) hours as needed for nausea or vomiting.    [provider]  oxyCODONE-acetaminophen (PERCOCET) 10-325 MG tablet Take 1 tablet by mouth every 6 (six)  hours as needed for pain.    [provider]  oxyCODONE-acetaminophen (PERCOCET/ROXICET) 5-325 MG tablet Take 1-2 tablets by mouth every 4 (four) hours as needed for moderate pain or severe pain. Patient not taking: Reported on 12/01/2016 04/30/16   Izora Gala A, PA-C  pantoprazole (PROTONIX) 40 MG tablet Take 1 tablet (40 mg total) by mouth daily at 6 (six) AM. Patient not taking: Reported on 11/26/2016 09/28/16   Rai, Vernelle Emerald, MD  phenazopyridine (PYRIDIUM) 200 MG tablet Take 1 tablet (200 mg total) by mouth 3 (three) times daily. Patient not taking: Reported on 11/26/2016 11/14/16   Charlann Lange, PA-C  RELPAX 40 MG tablet Take 1 tablet by mouth as needed for migraine and may take 1 additional tablet in 2 hours if no relief 01/16/16   [provider]  traZODone (DESYREL) 100 MG tablet Take 200 mg by mouth at bedtime as needed for sleep. 11/12/16   [provider]  vitamin E 100 UNIT capsule Take 100 Units by mouth every evening.    [provider]  zolpidem (AMBIEN) 5 MG tablet Take 1 tablet (5 mg total) by mouth at bedtime as needed for  sleep. Patient not taking: Reported on 11/13/2016 09/27/16   Mendel Corning, MD    Family History Family History  Problem Relation Age of Onset  . Lung cancer Mother 10  . Heart attack Father        Vague  . Hypertension Brother   . Esophageal cancer Brother   . Diabetes Paternal Grandmother        questionable  . Colon cancer Neg Hx   . Kidney disease Neg Hx   . Liver disease Neg Hx     Social History Social History  Substance Use Topics  . Smoking status: Current Some Day Smoker    Years: 34.00    Types: Cigarettes  . Smokeless tobacco: Never Used     Comment: 04/25/2016 "2 cigarettes/wik sometimes; sometimes nothing for months; real smoker til 2014"  . Alcohol use No     Comment: Denies 08/24/16     Allergies   Penicillins   Review of Systems Review of Systems  Constitutional: Negative for fever.  HENT: Negative for trouble swallowing.   Eyes: Negative for visual disturbance.  Respiratory: Negative for shortness of breath.   Cardiovascular: Negative for chest pain.  Gastrointestinal: Negative for abdominal pain.  Musculoskeletal: Positive for neck pain.  Skin: Negative for wound.  Neurological: Positive for weakness and headaches. Negative for facial asymmetry and speech difficulty.  Psychiatric/Behavioral: Negative for self-injury and suicidal ideas. The patient is nervous/anxious.      Physical Exam Updated Vital Signs BP 119/67   Pulse (!) 53   Temp 98.7 F (37.1 C) (Oral)   Resp 10   SpO2 98%   Physical Exam  Constitutional: She is oriented to person, place, and time. She appears well-developed and well-nourished. No distress.  No facial asymmetry  HENT:  Head: Normocephalic and atraumatic.  Eyes: Conjunctivae and EOM are normal. Pupils are equal, round, and reactive to light.  Neck: Normal range of motion.  Cardiovascular: Normal rate, normal heart sounds and intact distal pulses.   Slightly bradycardic  Pulmonary/Chest: Effort normal and  breath sounds normal. No respiratory distress. She has no wheezes. She has no rales.  Abdominal: Soft. Bowel sounds are normal. She exhibits no distension. There is no tenderness.  Musculoskeletal: Normal range of motion.  Neurological: She is alert and oriented to person,  place, and time. She exhibits normal muscle tone.  Patient with ataxic, shuffling gait. Bilateral upper extremities with equal strength. RLE with 4/5 strength, 5/5 strength LLE. Dec sensation RLE to light and sharp touch. Cranial nerves II through XII intact. No facial droop. No slurred speech.   Skin: Skin is warm.  Psychiatric: She has a normal mood and affect. Her behavior is normal.  Nursing note and vitals reviewed.    ED Treatments / Results  DIAGNOSTIC STUDIES:  Oxygen Saturation is 98% on RA, normal by my interpretation.    COORDINATION OF CARE:  7:07 PM Discussed treatment plan with pt at bedside and pt agreed to plan.   Labs (all labs ordered are listed, but only abnormal results are displayed) Labs Reviewed  COMPREHENSIVE METABOLIC PANEL - Abnormal; Notable for the following:       Result Value   BUN <5 (*)    All other components within normal limits  RAPID URINE DRUG SCREEN, HOSP PERFORMED - Abnormal; Notable for the following:    Benzodiazepines POSITIVE (*)    All other components within normal limits  I-STAT CHEM 8, ED - Abnormal; Notable for the following:    BUN 4 (*)    Calcium, Ion 1.14 (*)    All other components within normal limits  ETHANOL  PROTIME-INR  APTT  CBC  DIFFERENTIAL  URINALYSIS, ROUTINE W REFLEX MICROSCOPIC  I-STAT TROPOININ, ED    EKG  EKG Interpretation None       Radiology Ct Head Wo Contrast  Result Date: 12/17/2016 CLINICAL DATA:  66 y/o F; woke this morning with migraine and right-sided weakness. EXAM: CT HEAD WITHOUT CONTRAST TECHNIQUE: Contiguous axial images were obtained from the base of the skull through the vertex without intravenous contrast.  COMPARISON:  11/26/2016 CT head. FINDINGS: Brain: No evidence of acute infarction, hemorrhage, hydrocephalus, extra-axial collection or mass lesion/mass effect. Vascular: Mild calcific atherosclerosis of carotid siphons. Skull: Normal. Negative for fracture or focal lesion. Sinuses/Orbits: Small left maxillary sinus mucous retention cyst. Otherwise negative. Other: None. IMPRESSION: No acute intracranial abnormality identified. Unremarkable CT of the head. Electronically Signed   By: Kristine Garbe M.D.   On: 12/17/2016 20:02    Procedures Procedures (including critical care time)  Medications Ordered in ED Medications  prochlorperazine (COMPAZINE) injection 10 mg (10 mg Intravenous Given 12/17/16 2046)  diphenhydrAMINE (BENADRYL) injection 25 mg (25 mg Intravenous Given 12/17/16 2046)  sodium chloride 0.9 % bolus 1,000 mL (0 mLs Intravenous Stopped 12/17/16 2218)  morphine 4 MG/ML injection 4 mg (4 mg Intravenous Given 12/17/16 2218)     Initial Impression / Assessment and Plan / ED Course  I have reviewed the triage vital signs and the nursing notes.  Pertinent labs & imaging results that were available during my care of the patient were reviewed by me and considered in my medical decision making (see chart for details).     Patient with right lower extremity weakness for a couple of weeks. History of 2 strokes and hemiplegic migraines. Patient with headache and increased weakness this morning. Patient states these were different from the past hemiplegic migraines. On exam patient with right lower extremity weakness and ataxic gait. CN intact. Code stroke placed. Compazine and Benadryl given for migraine. CT head negative. Consult made neuro hospitalist.  Patient moved to a bed with cardiac monitoring.   Care assumed by Dr. Roxanne Mins.  Final Clinical Impressions(s) / ED Diagnoses   Final diagnoses:  None    New Prescriptions  New Prescriptions   No medications on file  I  personally performed the services described in this documentation, which was scribed in my presence. The recorded information has been reviewed and is accurate.    Russo, [place] N, PA-C 34/14/43 [id number]    Delora Fuel, MD 58/00/63 [phone]

## 2016-12-18 DIAGNOSIS — R51 Headache: Secondary | ICD-10-CM | POA: Diagnosis not present

## 2016-12-19 ENCOUNTER — Other Ambulatory Visit: Payer: Self-pay

## 2016-12-19 NOTE — Patient Outreach (Signed)
[facility]) Care Management  12/19/2016  Dorothy White 03/31/1951 [id number]   Telephone call to patient for high ED Utilization referral.  No answer.  HIPAA compliant voice message left.  Plan: RN Health Coach will attempt patient again within 10 business days.    Jone Baseman, RN, MSN [place] [phone]

## 2016-12-23 ENCOUNTER — Other Ambulatory Visit: Payer: Self-pay

## 2016-12-23 DIAGNOSIS — R2689 Other abnormalities of gait and mobility: Secondary | ICD-10-CM | POA: Diagnosis not present

## 2016-12-23 NOTE — Patient Outreach (Signed)
[facility]) Care Management  12/23/2016  Dorothy White [birth year] [id number]   2nd telephone call to patient for High ED Utilization referral.  No answer.  HIPAA compliant voice message left.    Plan: RN Health Coach will attempt patient again within the next ten business days.   Jone Baseman, RN, MSN [place] [phone]

## 2016-12-25 ENCOUNTER — Emergency Department (HOSPITAL_COMMUNITY)
Admission: EM | Admit: 2016-12-25 | Discharge: 2016-12-25 | Disposition: A | Discharge status: Home / Self Care | Payer: PPO | Attending: Emergency Medicine | Admitting: Emergency Medicine

## 2016-12-25 ENCOUNTER — Encounter (HOSPITAL_COMMUNITY): Payer: Self-pay | Admitting: Emergency Medicine

## 2016-12-25 ENCOUNTER — Emergency Department (HOSPITAL_COMMUNITY): Payer: PPO

## 2016-12-25 ENCOUNTER — Other Ambulatory Visit: Payer: Self-pay

## 2016-12-25 DIAGNOSIS — Z7982 Long term (current) use of aspirin: Secondary | ICD-10-CM | POA: Diagnosis not present

## 2016-12-25 DIAGNOSIS — Z955 Presence of coronary angioplasty implant and graft: Secondary | ICD-10-CM | POA: Insufficient documentation

## 2016-12-25 DIAGNOSIS — R079 Chest pain, unspecified: Secondary | ICD-10-CM | POA: Diagnosis not present

## 2016-12-25 DIAGNOSIS — Z8673 Personal history of transient ischemic attack (TIA), and cerebral infarction without residual deficits: Secondary | ICD-10-CM | POA: Diagnosis not present

## 2016-12-25 DIAGNOSIS — Z79899 Other long term (current) drug therapy: Secondary | ICD-10-CM | POA: Diagnosis not present

## 2016-12-25 DIAGNOSIS — Z85828 Personal history of other malignant neoplasm of skin: Secondary | ICD-10-CM | POA: Diagnosis not present

## 2016-12-25 DIAGNOSIS — F17210 Nicotine dependence, cigarettes, uncomplicated: Secondary | ICD-10-CM | POA: Diagnosis not present

## 2016-12-25 DIAGNOSIS — I2510 Atherosclerotic heart disease of native coronary artery without angina pectoris: Secondary | ICD-10-CM | POA: Insufficient documentation

## 2016-12-25 DIAGNOSIS — R072 Precordial pain: Secondary | ICD-10-CM | POA: Diagnosis not present

## 2016-12-25 LAB — CBC
HEMATOCRIT: 42.9 % (ref 36.0–46.0)
HEMOGLOBIN: 13.7 g/dL (ref 12.0–15.0)
MCH: 28.2 pg (ref 26.0–34.0)
MCHC: 31.9 g/dL (ref 30.0–36.0)
MCV: 88.5 fL (ref 78.0–100.0)
Platelets: 260 10*3/uL (ref 150–400)
RBC: 4.85 MIL/uL (ref 3.87–5.11)
RDW: 13.8 % (ref 11.5–15.5)
WBC: 7.2 10*3/uL (ref 4.0–10.5)

## 2016-12-25 LAB — BASIC METABOLIC PANEL
ANION GAP: 9 (ref 5–15)
BUN: 11 mg/dL (ref 6–20)
CHLORIDE: 103 mmol/L (ref 101–111)
CO2: 27 mmol/L (ref 22–32)
Calcium: 9.3 mg/dL (ref 8.9–10.3)
Creatinine, Ser: 0.89 mg/dL (ref 0.44–1.00)
GFR calc Af Amer: >60 mL/min (ref >60)
GLUCOSE: 80 mg/dL (ref 65–99)
Potassium: 4.3 mmol/L (ref 3.5–5.1)
SODIUM: 139 mmol/L (ref 135–145)

## 2016-12-25 LAB — I-STAT TROPONIN, ED: Troponin i, poc: 0.01 ng/mL (ref 0.00–0.08)

## 2016-12-25 MED ORDER — FAMOTIDINE 20 MG PO TABS
20.0000 mg | ORAL_TABLET | Freq: Once | ORAL | Status: AC
  Administered 2016-12-25: 20 mg via ORAL
  Filled 2016-12-25: qty 1

## 2016-12-25 MED ORDER — LORAZEPAM 0.5 MG PO TABS
0.5000 mg | ORAL_TABLET | Freq: Once | ORAL | Status: AC
  Administered 2016-12-25: 0.5 mg via ORAL
  Filled 2016-12-25: qty 1

## 2016-12-25 MED ORDER — ALUM & MAG HYDROXIDE-SIMETH 200-200-20 MG/5ML PO SUSP
15.0000 mL | Freq: Once | ORAL | Status: AC
  Administered 2016-12-25: 15 mL via ORAL
  Filled 2016-12-25: qty 30

## 2016-12-25 MED ORDER — ACETAMINOPHEN 325 MG PO TABS
650.0000 mg | ORAL_TABLET | Freq: Once | ORAL | Status: AC
  Administered 2016-12-25: 650 mg via ORAL
  Filled 2016-12-25: qty 2

## 2016-12-25 NOTE — ED Notes (Signed)
Dr Ashok Cordia into speak to pt about all her results which were all normal

## 2016-12-25 NOTE — ED Triage Notes (Signed)
Pt states that she started to have cp last night that started on her left chest and  raditiated around to left chest and ended on her left back shoulder, states has had nausea and saw her dr for that on Mon or Tuesday and they thought it was vertigo,  She took a phenergan today before she came she states

## 2016-12-25 NOTE — Discharge Instructions (Signed)
It was our pleasure to provide your ER care today - we hope that you feel better.  If reflux symptoms, try taking pepcid and maalox as need.  You may take acetaminophen and/or ibuprofen as need for pain.  For chest discomfort, follow up with your cardiologist in the next 1-2 weeks - call office to arrange appointment.   Return to ER if worse, new symptoms, trouble breathing, recurrent/persistent chest pain, other concern.

## 2016-12-25 NOTE — ED Notes (Signed)
Bed: WA03 Expected date:  Expected time:  Means of arrival:  Comments: 

## 2016-12-25 NOTE — Patient Outreach (Signed)
[facility]) Care Management  12/25/2016  CERENA BAINE 1951-03-03 [id number]   3rd telephone call to patient for ED Utilization screening.  No answer.  HIPAA compliant voice message left.  Plan: RN Health Coach will send a letter to attempt outreach. If no response within 10 business days will proceed with case closure.  Jone Baseman, RN, MSN [place] [phone]

## 2016-12-25 NOTE — ED Provider Notes (Signed)
[facility] DEPT Provider Note   CSN: [id number] Arrival date & time: 12/25/16  1059     History   Chief Complaint Chief Complaint  Patient presents with  . Chest Pain    HPI Dorothy White is a 66 y.o. female.  Patient c/o pain to chest for past 1-2 days. Pain occurs at rest. Is not related to activity or exertion. Pain is constant, dull, moderate, and 'moves in a circle around the outside of chest'.  No associated sob, nv or diaphoresis. Pain is not pleuritic. No leg pain or swelling. No fam hx cad. Patient denies exertional cp or discomfort. No unusual doe or fatigue. Denies cough or uri c/o. No fever or chills. +occasional reflux, sour taste in throat    The history is provided by the patient.  Chest Pain   Pertinent negatives include no abdominal pain, no back pain, no cough, no fever, no headaches, no palpitations and no shortness of breath.    Past Medical History:  Diagnosis Date  . Anemia    ?  Marland Kitchen Anxiety   . Arthritis    "everywhere" (04/25/2016)  . 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 Chronic back pain    "all over" (04/25/2016)  . Constipation   . Depression   . Diverticulitis   . GERD (gastroesophageal reflux disease)   . Headache    "weekly" (04/25/2016)  . HLD (hyperlipidemia)    hx (04/25/2016)  . 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 Mild CAD    a. 25% mLAD, otherwise no sig disease 01/2016 cath.  Marland Kitchen NICM (nonischemic cardiomyopathy) ([place])    a. EF 40-45% by echo 03/9832 at time of complicated migraine/neuro sx, 55-65% at time of cath 01/2016  . Stroke [facility]) 06/2013   "mini" stroke , ?possibly hemaplegic migraine per pt    Patient Active Problem List   Diagnosis Date Noted  . Chest pain 09/26/2016  . Diverticulitis of sigmoid colon 04/25/2016  . Infection due to ESBL-producing Escherichia coli/Diverticular Abscess 03/15/2016  . Diverticulitis 03/12/2016  . Chronic pain syndrome  03/12/2016  . Lesion of pancreas 03/12/2016  . History of chest pain 03/12/2016  . Hypokalemia 03/12/2016  . Angina, class IV ([place]) - chest tightness and pressure with dyspnea with minimal exertion 02/17/2016    Class: Question of  . TIA (transient ischemic attack) 12/26/2015  . DOE (dyspnea on exertion) 12/26/2015  . Right sided weakness 12/17/2015  . Ataxia 12/17/2015  . Shoulder fracture 02/15/2015  . Chronic headache 10/11/2013  . Weakness generalized 08/12/2013  . Dizziness and giddiness 08/12/2013  . Abnormality of gait 07/11/2013  . Hyperlipidemia 05/07/2007  . COMMON MIGRAINE 05/07/2007  . PREMATURE VENTRICULAR CONTRACTIONS, FREQUENT 05/07/2007  . GERD 05/07/2007  . DIVERTICULOSIS, COLON 05/07/2007  . 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 OSTEOPENIA 05/07/2007  . Anxiety and depression 03/24/2007  . HEMORRHOIDS 03/24/2007  . ALLERGIC RHINITIS 03/24/2007  . LOW BACK PAIN 03/24/2007  . MIGRAINES, HX OF 03/24/2007    Past Surgical History:  Procedure Laterality Date  . AUGMENTATION MAMMAPLASTY  1980  . BASAL CELL CARCINOMA EXCISION     "tip of my nose"  . BUNIONECTOMY Bilateral 10/2003  . CARDIAC CATHETERIZATION N/A 02/20/2016   Procedure: Left Heart Cath and Coronary Angiography;  Surgeon: Jettie Booze, MD;  Location: [facility];  Service: Cardiovascular;  Laterality: N/A;  . COLONOSCOPY    . DILATION AND CURETTAGE  OF UTERUS    . FRACTURE SURGERY    . IR GENERIC HISTORICAL  03/18/2016   IR SINUS/FIST TUBE CHK-NON GI 03/18/2016 Aletta Edouard, MD [facility]-INTERV RAD  . LAPAROSCOPIC SIGMOID COLECTOMY N/A 04/25/2016   Procedure: LAPAROSCOPIC SIGMOID COLECTOMY;  Surgeon: Stark Klein, MD;  Location: [facility];  Service: General;  Laterality: N/A;  . OOPHORECTOMY Bilateral ~ 1999  . ORIF HUMERUS FRACTURE Right 02/15/2015   Procedure: OPEN REDUCTION INTERNAL FIXATION (ORIF) PROXIMAL HUMERUS FRACTURE;  Surgeon: Netta Cedars, MD;  Location: [facility];  Service:  Orthopedics;  Laterality: Right;  . RHINOPLASTY  1976  . TONSILLECTOMY    . TUBAL LIGATION  ~ 1983  . VAGINAL HYSTERECTOMY  ~ 1998    OB History    No data available       Home Medications    Prior to Admission medications   Medication Sig Start Date End Date Taking? Authorizing Provider  ARTIFICIAL TEAR OP Apply 1 drop to eye daily as needed (dry eyes).    [provider]  Ascorbic Acid (VITAMIN C PO) Take 1 tablet by mouth every evening.    [provider]  aspirin EC 81 MG tablet Take 81 mg by mouth daily.     [provider]  buPROPion (WELLBUTRIN) 100 MG tablet Take 100 mg by mouth 2 (two) times daily. 11/13/16   [provider]  busPIRone (BUSPAR) 5 MG tablet Take 5 mg by mouth 3 (three) times daily. 11/20/16   [provider]  Cholecalciferol (VITAMIN D PO) Take 1 capsule by mouth every evening.    [provider]  clonazePAM (KLONOPIN) 1 MG tablet Take 1 tablet (1 mg total) by mouth daily as needed (anxiety/sleep.). 02/15/16   Dorothy Spark, MD  doxycycline (VIBRAMYCIN) 100 MG capsule Take 1 capsule (100 mg total) by mouth 2 (two) times daily. 12/01/16   Doristine Devoid, PA-C  FLUoxetine (PROZAC) 20 MG capsule Take 2 capsules (40 mg total) by mouth daily. Patient taking differently: Take 60 mg by mouth daily.  09/27/16   Rai, Vernelle Emerald, MD  ibuprofen (ADVIL,MOTRIN) 200 MG tablet Take 400-600 mg by mouth every 6 (six) hours as needed for mild pain or moderate pain.    [provider]  levocetirizine (XYZAL) 5 MG tablet Take 5 mg by mouth every evening.    [provider]  levofloxacin (LEVAQUIN) 500 MG tablet Take 1 tablet (500 mg total) by mouth daily. Patient not taking: Reported on 12/01/2016 11/14/16   Charlann Lange, PA-C  LUTEIN PO Take 1 capsule by mouth every evening.    [provider]  mometasone (NASONEX) 50 MCG/ACT nasal spray Place 2 sprays into the nose daily.    [provider]  ondansetron (ZOFRAN) 8 MG tablet Take 8 mg by mouth every 8 (eight) hours as needed for nausea or vomiting.    [provider]  oxyCODONE-acetaminophen (PERCOCET) 10-325 MG tablet Take 1 tablet by mouth every 6 (six) hours as needed for pain.    [provider]  oxyCODONE-acetaminophen (PERCOCET/ROXICET) 5-325 MG tablet Take 1-2 tablets by mouth every 4 (four) hours as needed for moderate pain or severe pain. Patient not taking: Reported on 12/01/2016 04/30/16   Izora Gala A, PA-C  pantoprazole (PROTONIX) 40 MG tablet Take 1 tablet (40 mg total) by mouth daily at 6 (six) AM. Patient not taking: Reported on 11/26/2016 09/28/16   Rai, Vernelle Emerald, MD  phenazopyridine (PYRIDIUM) 200 MG tablet Take 1 tablet (  200 mg total) by mouth 3 (three) times daily. Patient not taking: Reported on 11/26/2016 11/14/16   Charlann Lange, PA-C  RELPAX 40 MG tablet Take 1 tablet by mouth as needed for migraine and may take 1 additional tablet in 2 hours if no relief 01/16/16   [provider]  traZODone (DESYREL) 100 MG tablet Take 200 mg by mouth at bedtime as needed for sleep. 11/12/16   [provider]  vitamin E 100 UNIT capsule Take 100 Units by mouth every evening.    [provider]  zolpidem (AMBIEN) 5 MG tablet Take 1 tablet (5 mg total) by mouth at bedtime as needed for sleep. Patient not taking: Reported on 11/13/2016 09/27/16   Mendel Corning, MD    Family History Family History  Problem Relation Age of Onset  . Lung cancer Mother 50  . Heart attack Father        Vague  . Hypertension Brother   . Esophageal cancer Brother   . Diabetes Paternal Grandmother        questionable  . Colon cancer Neg Hx   . Kidney disease Neg Hx   . Liver disease Neg Hx     Social History Social History  Substance Use Topics  . Smoking status: Current Some Day Smoker    Years: 34.00    Types: Cigarettes  . Smokeless tobacco: Never Used     Comment: 04/25/2016 "2  cigarettes/wik sometimes; sometimes nothing for months; real smoker til 2014"  . Alcohol use No     Comment: Denies 08/24/16     Allergies   Penicillins   Review of Systems Review of Systems  Constitutional: Negative for fever.  HENT: Negative for sore throat.   Eyes: Negative for redness.  Respiratory: Negative for cough and shortness of breath.   Cardiovascular: Positive for chest pain. Negative for palpitations and leg swelling.  Gastrointestinal: Negative for abdominal pain.  Genitourinary: Negative for flank pain.  Musculoskeletal: Negative for back pain and neck pain.  Skin: Negative for rash.  Neurological: Negative for headaches.  Hematological: Does not bruise/bleed easily.  Psychiatric/Behavioral: Negative for confusion.     Physical Exam Updated Vital Signs BP 115/89 (BP Location: Right Arm)   Pulse 93   Temp 99.1 F (37.3 C) (Oral)   Resp 20   Ht 1.651 m (5\' 5" )   Wt 54 kg (119 lb)   SpO2 97%   BMI 19.80 kg/m   Physical Exam  Constitutional: She appears well-developed and well-nourished. No distress.  HENT:  Mouth/Throat: Oropharynx is clear and moist.  Eyes: Conjunctivae are normal. No scleral icterus.  Neck: Neck supple. No tracheal deviation present.  Cardiovascular: Normal rate, regular rhythm, normal heart sounds and intact distal pulses.  Exam reveals no gallop and no friction rub.   No murmur heard. Pulmonary/Chest: Effort normal and breath sounds normal. No respiratory distress. She exhibits no tenderness.  Abdominal: Soft. Normal appearance and bowel sounds are normal. She exhibits no distension. There is no tenderness.  Musculoskeletal: She exhibits no edema or tenderness.  Neurological: She is alert.  Skin: Skin is warm and dry. No rash noted. She is not diaphoretic.  Psychiatric:  Anxious appearing.   Nursing note and vitals reviewed.    ED Treatments / Results  Labs (all labs ordered are listed, but only abnormal results are  displayed) Results for orders placed or performed during the hospital encounter of 16/10/96  Basic metabolic panel  Result Value Ref Range  Sodium 139 135 - 145 mmol/L   Potassium 4.3 3.5 - 5.1 mmol/L   Chloride 103 101 - 111 mmol/L   CO2 27 22 - 32 mmol/L   Glucose, Bld 80 65 - 99 mg/dL   BUN 11 6 - 20 mg/dL   Creatinine, Ser 0.89 0.44 - 1.00 mg/dL   Calcium 9.3 8.9 - 10.3 mg/dL   GFR calc non Af Amer >60 >60 mL/min   GFR calc Af Amer >60 >60 mL/min   Anion gap 9 5 - 15  CBC  Result Value Ref Range   WBC 7.2 4.0 - 10.5 K/uL   RBC 4.85 3.87 - 5.11 MIL/uL   Hemoglobin 13.7 12.0 - 15.0 g/dL   HCT 42.9 36.0 - 46.0 %   MCV 88.5 78.0 - 100.0 fL   MCH 28.2 26.0 - 34.0 pg   MCHC 31.9 30.0 - 36.0 g/dL   RDW 13.8 11.5 - 15.5 %   Platelets 260 150 - 400 K/uL  I-stat troponin, ED  Result Value Ref Range   Troponin i, poc 0.01 0.00 - 0.08 ng/mL   Comment 3           Dg Chest 2 View  Result Date: 12/25/2016 CLINICAL DATA:  Chest and left shoulder pain with nausea today. EXAM: CHEST  2 VIEW COMPARISON:  PA and lateral chest 11/26/2016 and 09/26/2016. CT chest 12/19/2014. FINDINGS: The lungs are clear. Heart size is normal. No pneumothorax or pleural effusion. Calcified breast implants are identified. No focal bony abnormality. Postoperative change of fracture fixation right humerus noted. IMPRESSION: No acute disease. Electronically Signed   By: Inge Rise M.D.   On: 12/25/2016 13:28      EKG  EKG Interpretation  Date/Time:  Thursday Dec 25 2016 11:12:44 EDT Ventricular Rate:  95 PR Interval:  112 QRS Duration: 90 QT Interval:  412 QTC Calculation: 517 R Axis:   79 Text Interpretation:  Normal sinus rhythm Nonspecific T wave abnormality Prolonged QT Confirmed by Ashok Cordia  MD, Lennette Bihari ([id number]) on 12/25/2016 11:29:23 AM       Radiology No results found.  Procedures Procedures (including critical care time)  Medications Ordered in ED Medications  famotidine (PEPCID)  tablet 20 mg (not administered)  alum & mag hydroxide-simeth (MAALOX/MYLANTA) 200-200-20 MG/5ML suspension 15 mL (not administered)  acetaminophen (TYLENOL) tablet 650 mg (not administered)  LORazepam (ATIVAN) tablet 0.5 mg (not administered)     Initial Impression / Assessment and Plan / ED Course  I have reviewed the triage vital signs and the nursing notes.  Pertinent labs & imaging results that were available during my care of the patient were reviewed by me and considered in my medical decision making (see chart for details).  Labs. Cxr. Ecg.  Pt given pepcid, maalox, for symptom relief.  Pt given ativan po for anxiety, hx same.  pts symptoms atypical for acs, - after present/constant x more than 24 hours, trop normal.   Recheck pt comfortable, no distress.    Final Clinical Impressions(s) / ED Diagnoses   Final diagnoses:  None    New Prescriptions New Prescriptions   No medications on file     Lajean Saver, MD 12/25/16 1413

## 2016-12-26 DIAGNOSIS — N952 Postmenopausal atrophic vaginitis: Secondary | ICD-10-CM | POA: Diagnosis not present

## 2016-12-26 DIAGNOSIS — H903 Sensorineural hearing loss, bilateral: Secondary | ICD-10-CM | POA: Diagnosis not present

## 2016-12-26 DIAGNOSIS — R42 Dizziness and giddiness: Secondary | ICD-10-CM | POA: Diagnosis not present

## 2016-12-26 DIAGNOSIS — N750 Cyst of Bartholin's gland: Secondary | ICD-10-CM | POA: Diagnosis not present

## 2016-12-26 DIAGNOSIS — Z8673 Personal history of transient ischemic attack (TIA), and cerebral infarction without residual deficits: Secondary | ICD-10-CM | POA: Diagnosis not present

## 2016-12-30 ENCOUNTER — Ambulatory Visit: Payer: PPO | Admitting: Physician Assistant

## 2016-12-31 DIAGNOSIS — M170 Bilateral primary osteoarthritis of knee: Secondary | ICD-10-CM | POA: Diagnosis not present

## 2016-12-31 DIAGNOSIS — M545 Low back pain: Secondary | ICD-10-CM | POA: Diagnosis not present

## 2016-12-31 DIAGNOSIS — M542 Cervicalgia: Secondary | ICD-10-CM | POA: Diagnosis not present

## 2017-01-02 ENCOUNTER — Encounter: Payer: Self-pay | Admitting: Physician Assistant

## 2017-01-05 ENCOUNTER — Other Ambulatory Visit: Payer: Self-pay

## 2017-01-05 NOTE — Patient Outreach (Signed)
[facility]) Care Management  01/05/2017  Dorothy White October 26, 1950 [id number]   Patient has not responded to calls or letter.  RN CM will proceed with case closure.  RN CM will notify care management assistant of case closure.  Jone Baseman, RN, MSN [place] [phone]

## 2017-01-06 DIAGNOSIS — G894 Chronic pain syndrome: Secondary | ICD-10-CM | POA: Diagnosis not present

## 2017-01-06 DIAGNOSIS — M5012 Mid-cervical disc disorder, unspecified level: Secondary | ICD-10-CM | POA: Diagnosis not present

## 2017-01-07 DIAGNOSIS — H2513 Age-related nuclear cataract, bilateral: Secondary | ICD-10-CM | POA: Diagnosis not present

## 2017-01-07 DIAGNOSIS — H5213 Myopia, bilateral: Secondary | ICD-10-CM | POA: Diagnosis not present

## 2017-01-07 DIAGNOSIS — H25013 Cortical age-related cataract, bilateral: Secondary | ICD-10-CM | POA: Diagnosis not present

## 2017-01-11 ENCOUNTER — Other Ambulatory Visit: Payer: Self-pay | Admitting: Cardiology

## 2017-01-15 ENCOUNTER — Ambulatory Visit: Payer: PPO | Admitting: Neurology

## 2017-01-16 ENCOUNTER — Emergency Department (HOSPITAL_COMMUNITY): Payer: PPO

## 2017-01-16 ENCOUNTER — Telehealth: Payer: Self-pay | Admitting: Internal Medicine

## 2017-01-16 ENCOUNTER — Telehealth: Payer: Self-pay | Admitting: Gastroenterology

## 2017-01-16 ENCOUNTER — Encounter (HOSPITAL_COMMUNITY): Payer: Self-pay | Admitting: Nurse Practitioner

## 2017-01-16 ENCOUNTER — Emergency Department (HOSPITAL_COMMUNITY)
Admission: EM | Admit: 2017-01-16 | Discharge: 2017-01-16 | Disposition: A | Discharge status: Home / Self Care | Payer: PPO | Attending: Emergency Medicine | Admitting: Emergency Medicine

## 2017-01-16 DIAGNOSIS — R197 Diarrhea, unspecified: Secondary | ICD-10-CM | POA: Diagnosis not present

## 2017-01-16 DIAGNOSIS — Z955 Presence of coronary angioplasty implant and graft: Secondary | ICD-10-CM | POA: Diagnosis not present

## 2017-01-16 DIAGNOSIS — F17210 Nicotine dependence, cigarettes, uncomplicated: Secondary | ICD-10-CM | POA: Diagnosis not present

## 2017-01-16 DIAGNOSIS — I2510 Atherosclerotic heart disease of native coronary artery without angina pectoris: Secondary | ICD-10-CM | POA: Insufficient documentation

## 2017-01-16 DIAGNOSIS — Z7982 Long term (current) use of aspirin: Secondary | ICD-10-CM | POA: Diagnosis not present

## 2017-01-16 DIAGNOSIS — R109 Unspecified abdominal pain: Secondary | ICD-10-CM | POA: Diagnosis not present

## 2017-01-16 DIAGNOSIS — Z79899 Other long term (current) drug therapy: Secondary | ICD-10-CM | POA: Diagnosis not present

## 2017-01-16 DIAGNOSIS — Z8673 Personal history of transient ischemic attack (TIA), and cerebral infarction without residual deficits: Secondary | ICD-10-CM | POA: Insufficient documentation

## 2017-01-16 DIAGNOSIS — R110 Nausea: Secondary | ICD-10-CM | POA: Diagnosis not present

## 2017-01-16 DIAGNOSIS — R1084 Generalized abdominal pain: Secondary | ICD-10-CM | POA: Diagnosis not present

## 2017-01-16 DIAGNOSIS — K2970 Gastritis, unspecified, without bleeding: Secondary | ICD-10-CM | POA: Diagnosis not present

## 2017-01-16 LAB — COMPREHENSIVE METABOLIC PANEL
ALK PHOS: 94 U/L (ref 38–126)
ALT: 21 U/L (ref 14–54)
AST: 18 U/L (ref 15–41)
Albumin: 3.5 g/dL (ref 3.5–5.0)
Anion gap: 8 (ref 5–15)
BILIRUBIN TOTAL: 0.7 mg/dL (ref 0.3–1.2)
BUN: 11 mg/dL (ref 6–20)
CALCIUM: 8.8 mg/dL — AB (ref 8.9–10.3)
CO2: 24 mmol/L (ref 22–32)
CREATININE: 0.73 mg/dL (ref 0.44–1.00)
Chloride: 108 mmol/L (ref 101–111)
GFR calc Af Amer: >60 mL/min (ref >60)
GFR calc non Af Amer: >60 mL/min (ref >60)
GLUCOSE: 92 mg/dL (ref 65–99)
Potassium: 3.6 mmol/L (ref 3.5–5.1)
SODIUM: 140 mmol/L (ref 135–145)
TOTAL PROTEIN: 6.1 g/dL — AB (ref 6.5–8.1)

## 2017-01-16 LAB — CBC WITH DIFFERENTIAL/PLATELET
Basophils Absolute: 0 10*3/uL (ref 0.0–0.1)
Basophils Relative: 0 %
EOS ABS: 0.2 10*3/uL (ref 0.0–0.7)
Eosinophils Relative: 3 %
HEMATOCRIT: 37.9 % (ref 36.0–46.0)
HEMOGLOBIN: 12.4 g/dL (ref 12.0–15.0)
LYMPHS ABS: 1.8 10*3/uL (ref 0.7–4.0)
LYMPHS PCT: 31 %
MCH: 28.4 pg (ref 26.0–34.0)
MCHC: 32.7 g/dL (ref 30.0–36.0)
MCV: 86.9 fL (ref 78.0–100.0)
MONOS PCT: 9 %
Monocytes Absolute: 0.5 10*3/uL (ref 0.1–1.0)
NEUTROS ABS: 3.5 10*3/uL (ref 1.7–7.7)
NEUTROS PCT: 57 %
Platelets: 234 10*3/uL (ref 150–400)
RBC: 4.36 MIL/uL (ref 3.87–5.11)
RDW: 14.3 % (ref 11.5–15.5)
WBC: 6 10*3/uL (ref 4.0–10.5)

## 2017-01-16 LAB — POC OCCULT BLOOD, ED: Fecal Occult Bld: NEGATIVE

## 2017-01-16 LAB — LIPASE, BLOOD: Lipase: 20 U/L (ref 11–51)

## 2017-01-16 IMAGING — CT CT ANGIO CHEST
2 of 6 series · 19 of 36 positions shown · IV contrast (OMNIPAQUE 350)
Comparison: CT abdomen/ pelvis 10/28/2013

CLINICAL DATA: Chest pain 3 days. Shortness of breath and weight
loss. Evaluate for pulmonary embolism.

EXAM:
CT ANGIOGRAPHY CHEST WITH CONTRAST
TECHNIQUE: Multidetector CT imaging of the chest was performed using the
standard protocol during bolus administration of intravenous
contrast. Multiplanar CT image reconstructions and MIPs were
obtained to evaluate the vascular anatomy.
CONTRAST:  100mL OMNIPAQUE IOHEXOL 350 MG/ML SOLN

[Series 8: coronal mpr · coronal · 0.48mm/px · 1 of 126 slices shown]
[im 63/126  mediastinal]
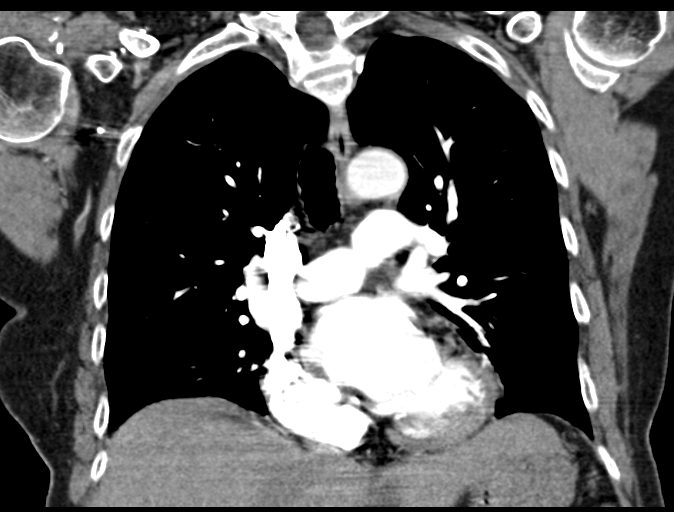

[Series 13: thins for pacs · axial · 0.62mm/px · z∈[+1339,+1552]mm · 18 of 237 slices shown]
[im 12/237  lung]
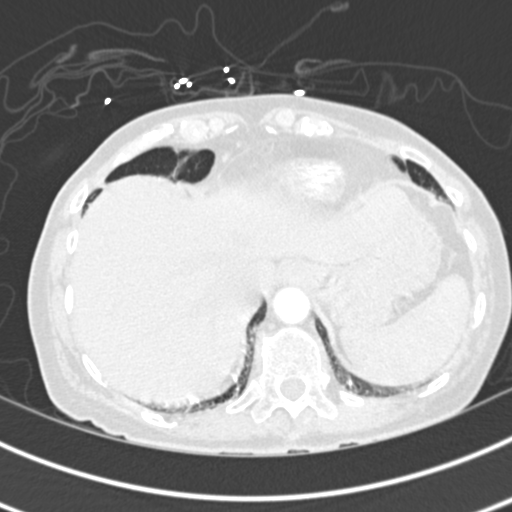
[im 24/237  mediastinal]
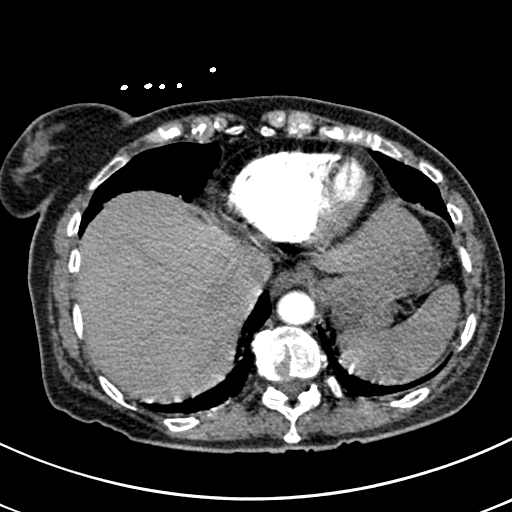
[im 36/237  lung]
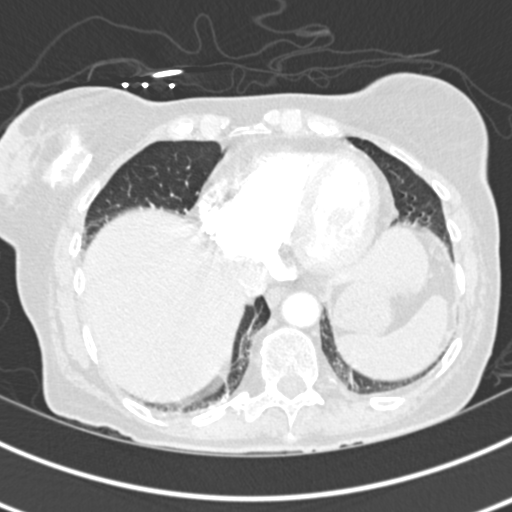
[im 48/237  mediastinal]
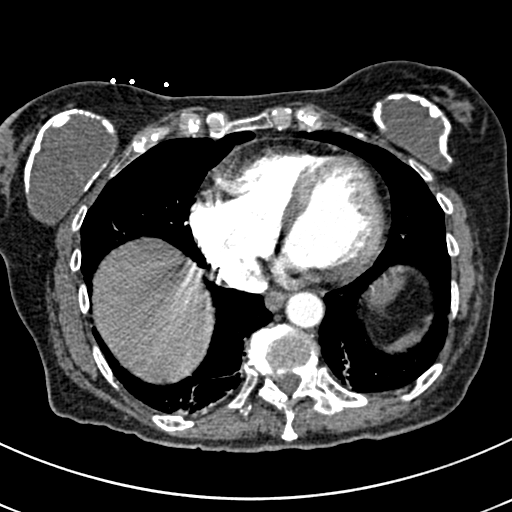
[im 60/237  lung]
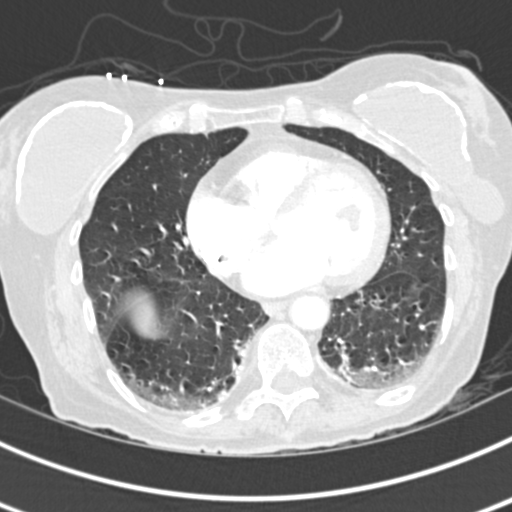
[im 71/237  mediastinal]
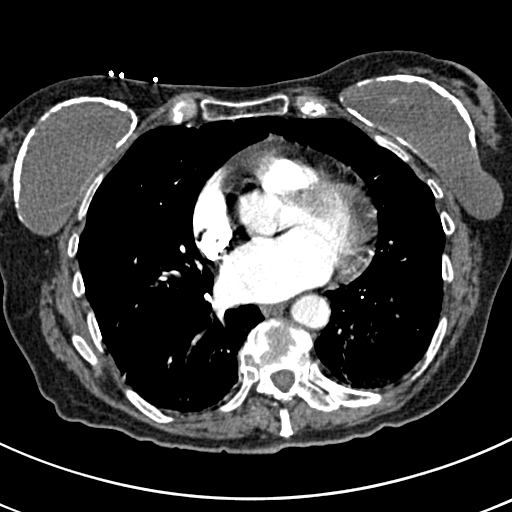
[im 83/237  lung]
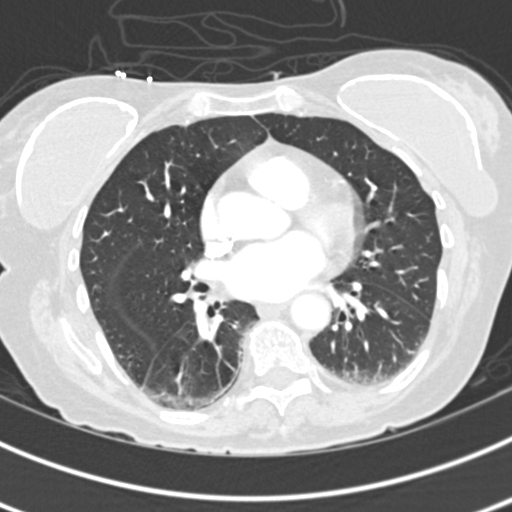
[im 95/237  mediastinal]
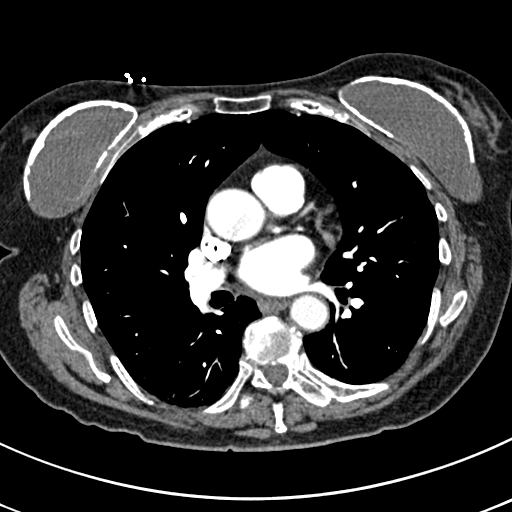
[im 107/237  lung]
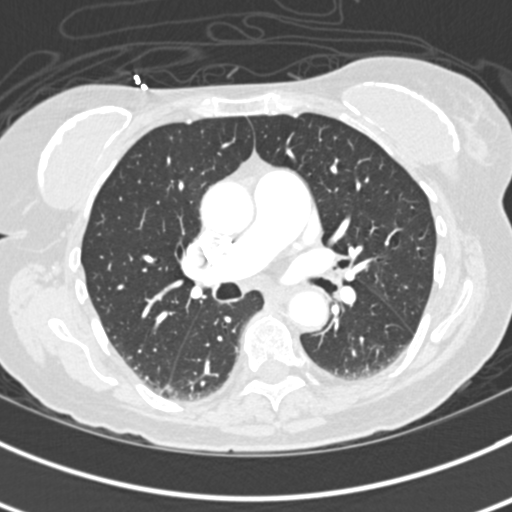
[im 130/237  mediastinal]
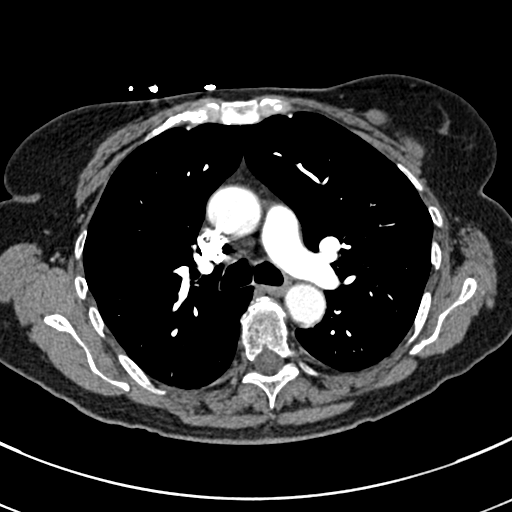
[im 142/237  lung]
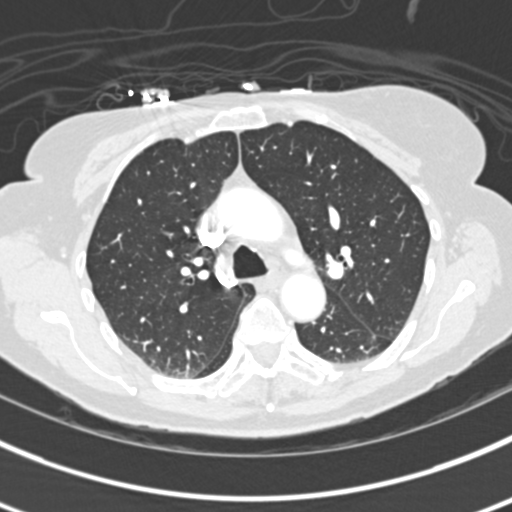
[im 154/237  mediastinal]
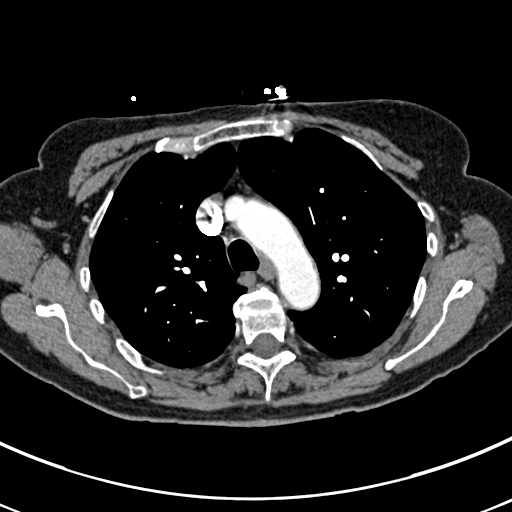
[im 166/237  lung]
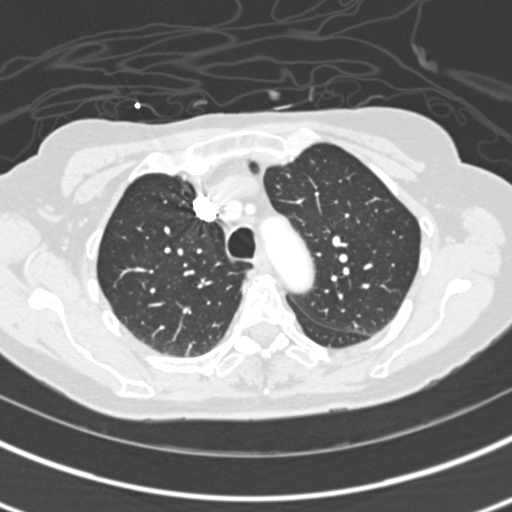
[im 178/237  mediastinal]
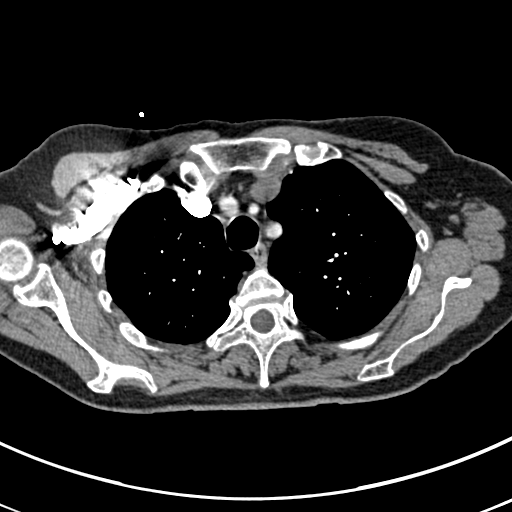
[im 189/237  lung]
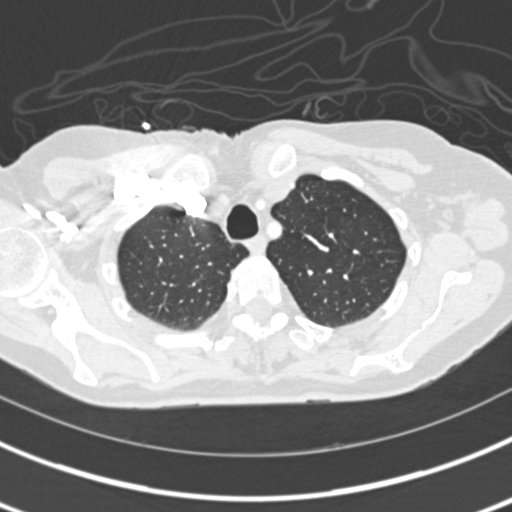
[im 201/237  mediastinal]
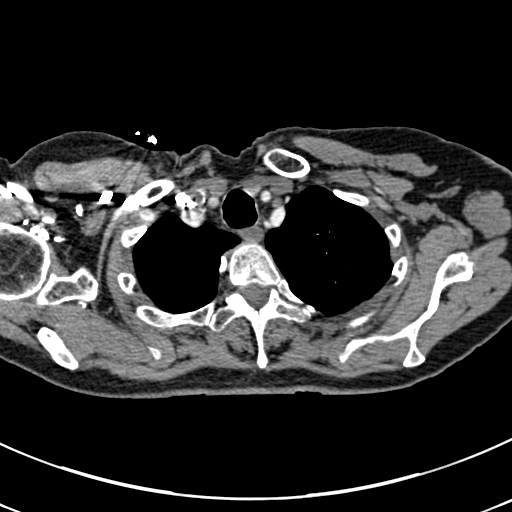
[im 213/237  lung]
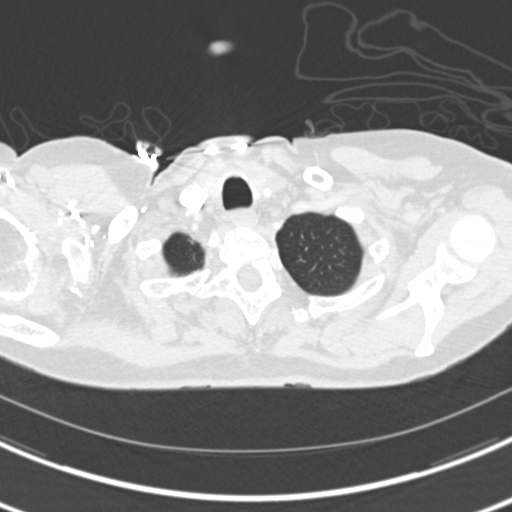
[im 225/237  mediastinal]
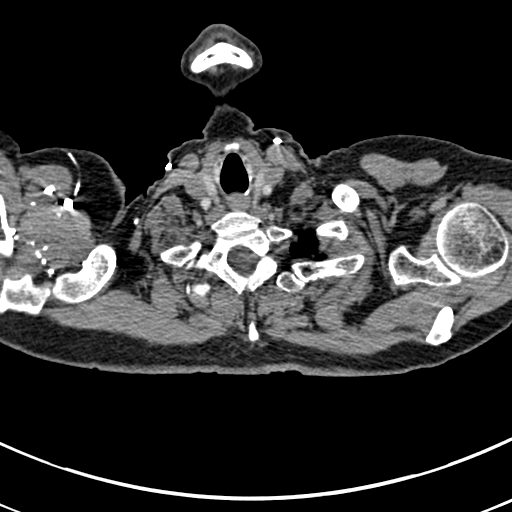

[19 of 36 positions shown; findings below may reference images not displayed]

FINDINGS: Lungs are well inflated and demonstrate subtle dependent bibasilar
atelectatic change. There is no evidence of effusion or focal
consolidation. Airways are within normal.

Heart is normal in size. Pulmonary artery is well opacified without
evidence of emboli. There is no evidence of hilar or mediastinal
adenopathy. No axillary adenopathy. Remaining mediastinal structures
are within normal. Bilateral breast implants are present with
suggestion of extracapsular rupture along the lateral border of the
left implant.

Images through the upper abdomen are unremarkable. There are mild
degenerate changes of the spine.

Review of the MIP images confirms the above findings.
IMPRESSION: No acute cardiopulmonary disease and no evidence of pulmonary
embolism.

Suggestion of contain extracapsular rupture of the lateral aspect of
the left breast implant. Recommend correlation with mammographic
findings.

## 2017-01-16 MED ORDER — HYDROCODONE-ACETAMINOPHEN 5-325 MG PO TABS
2.0000 | ORAL_TABLET | Freq: Once | ORAL | Status: AC
  Administered 2017-01-16: 2 via ORAL
  Filled 2017-01-16: qty 2

## 2017-01-16 MED ORDER — MORPHINE SULFATE (PF) 4 MG/ML IV SOLN
4.0000 mg | Freq: Once | INTRAVENOUS | Status: AC
  Administered 2017-01-16: 4 mg via INTRAVENOUS
  Filled 2017-01-16: qty 1

## 2017-01-16 MED ORDER — ONDANSETRON 4 MG PO TBDP: 4.0000 mg | ORAL_TABLET | Freq: Three times a day (TID) | ORAL | 0 refills | Status: DC | PRN

## 2017-01-16 MED ORDER — SODIUM CHLORIDE 0.9 % IV BOLUS (SEPSIS)
1000.0000 mL | Freq: Once | INTRAVENOUS | Status: AC
  Administered 2017-01-16: 1000 mL via INTRAVENOUS

## 2017-01-16 MED ORDER — IOPAMIDOL (ISOVUE-300) INJECTION 61%
INTRAVENOUS | Status: AC
  Administered 2017-01-16: 100 mL
  Filled 2017-01-16: qty 30

## 2017-01-16 MED ORDER — CIPROFLOXACIN HCL 500 MG PO TABS: 500.0000 mg | ORAL_TABLET | Freq: Two times a day (BID) | ORAL | 0 refills | Status: DC

## 2017-01-16 MED ORDER — IOPAMIDOL (ISOVUE-300) INJECTION 61%
INTRAVENOUS | Status: AC
  Filled 2017-01-16: qty 100

## 2017-01-16 MED ORDER — ONDANSETRON HCL 4 MG/2ML IJ SOLN
4.0000 mg | Freq: Once | INTRAMUSCULAR | Status: AC
  Administered 2017-01-16: 4 mg via INTRAVENOUS
  Filled 2017-01-16: qty 2

## 2017-01-16 MED ORDER — HYDROCODONE-ACETAMINOPHEN 5-325 MG PO TABS: 2.0000 | ORAL_TABLET | ORAL | 0 refills | Status: DC | PRN

## 2017-01-16 MED ORDER — METRONIDAZOLE 500 MG PO TABS: 500.0000 mg | ORAL_TABLET | Freq: Two times a day (BID) | ORAL | 0 refills | Status: DC

## 2017-01-16 NOTE — ED Notes (Signed)
Bed: [facility] Expected date:  Expected time:  Means of arrival:  Comments: EMS-abdominal pain

## 2017-01-16 NOTE — ED Triage Notes (Signed)
Patient presents to [facility] via [facility] from home for complaints of black, tar-like stools that started 2 weeks ago. She notified her PCP at that time who recommended she be seen. She did not go to her PCP and when abdominal pain started this morning the PCP recommended she go to the ED. Abdominal pain is intermittent.   EMS discloses that patient says she is depressed.

## 2017-01-16 NOTE — Telephone Encounter (Signed)
On call note. Our office is closed now. Answering service left message of "black diarrhea". Pt did not answer phone so I left voice message on her phone advising her to seek care in an urgent care or ED now. She can call back when she is free to take a call if other questions.

## 2017-01-16 NOTE — Discharge Instructions (Signed)
See your Doctor for recheck next week.

## 2017-01-17 NOTE — ED Provider Notes (Signed)
CSN: [id number]     Arrival date & time 01/16/17  1819 History   None    Chief Complaint  Patient presents with  . Stool Color Change  . Abdominal Pain  . Depression   (Consider location/radiation/quality/duration/timing/severity/associated sxs/prior Treatment) The history is provided by the patient. No language interpreter was used.  Abdominal Pain  Pain location:  Generalized Pain quality: aching   Pain radiates to:  Does not radiate Pain severity:  Moderate Duration:  2 weeks Timing:  Constant Progression:  Worsening Chronicity:  New Context: not alcohol use, not awakening from sleep, not diet changes, not suspicious food intake and not trauma   Relieved by:  Nothing Worsened by:  Nothing Ineffective treatments:  None tried Associated symptoms: nausea   Risk factors: multiple surgeries   Risk factors: no alcohol abuse   Depression  Associated symptoms include abdominal pain.   Pt reports her stools have been dark for the past 2 weeks and she is having lower abdominal discomfort.  Pt has a history of diverticulitis with a colon resection.  Past Medical History:  Diagnosis Date  . Anemia    ?  Marland Kitchen Anxiety   . Arthritis    "everywhere" (04/25/2016)  . 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 Chronic back pain    "all over" (04/25/2016)  . Constipation   . Depression   . Diverticulitis   . GERD (gastroesophageal reflux disease)   . Headache    "weekly" (04/25/2016)  . HLD (hyperlipidemia)    hx (04/25/2016)  . 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 Mild CAD    a. 25% mLAD, otherwise no sig disease 01/2016 cath.  Marland Kitchen NICM (nonischemic cardiomyopathy) ([place])    a. EF 40-45% by echo 12/2834 at time of complicated migraine/neuro sx, 55-65% at time of cath 01/2016  . Stroke [facility]) 06/2013   "mini" stroke , ?possibly hemaplegic migraine per pt   Past Surgical History:  Procedure Laterality Date  . AUGMENTATION  MAMMAPLASTY  1980  . BASAL CELL CARCINOMA EXCISION     "tip of my nose"  . BUNIONECTOMY Bilateral 10/2003  . CARDIAC CATHETERIZATION N/A 02/20/2016   Procedure: Left Heart Cath and Coronary Angiography;  Surgeon: Jettie Booze, MD;  Location: [place] [facility];  Service: Cardiovascular;  Laterality: N/A;  . COLONOSCOPY    . DILATION AND CURETTAGE OF UTERUS    . FRACTURE SURGERY    . IR GENERIC HISTORICAL  03/18/2016   IR SINUS/FIST TUBE CHK-NON GI 03/18/2016 Aletta Edouard, MD [facility]INTER[facility] RAD  . LAPAROSCOPIC SIGMOID COLECTOMY N/A 04/25/2016   Procedure: LAPAROSCOPIC SIGMOID COLECTOMY;  Surgeon: Stark Klein, MD;  Location: [place];  Service: General;  Laterality: N/A;  . OOPHORECTOMY Bilateral ~ 1999  . ORIF HUMERUS FRACTURE Right 02/15/2015   Procedure: OPEN REDUCTION INTERNAL FIXATION (ORIF) PROXIMAL HUMERUS FRACTURE;  Surgeon: Netta Cedars, MD;  Location: [place];  Service: Orthopedics;  Laterality: Right;  . RHINOPLASTY  1976  . TONSILLECTOMY    . TUBAL LIGATION  ~ 1983  . VAGINAL HYSTERECTOMY  ~ 1998   Family History  Problem Relation Age of Onset  . Lung cancer Mother 94  . Heart attack Father        Vague  . Hypertension Brother   . Esophageal cancer Brother   . Diabetes Paternal Grandmother        questionable  . Colon cancer Neg Hx   .  Kidney disease Neg Hx   . Liver disease Neg Hx    Social History  Substance Use Topics  . Smoking status: Current Some Day Smoker    Years: 34.00    Types: Cigarettes  . Smokeless tobacco: Never Used     Comment: 04/25/2016 "2 cigarettes/wik sometimes; sometimes nothing for months; real smoker til 2014"  . Alcohol use No     Comment: Denies 08/24/16   OB History    No data available     Review of Systems  Gastrointestinal: Positive for abdominal pain and nausea.  Psychiatric/Behavioral: Positive for depression.  All other systems reviewed and are negative.   Allergies  Penicillins  Home Medications   Prior to Admission  medications   Medication Sig Start Date End Date Taking? Authorizing Provider  ARTIFICIAL TEAR OP Apply 1 drop to eye daily as needed (dry eyes).    [provider]  Ascorbic Acid (VITAMIN C PO) Take 1 tablet by mouth every evening.    [provider]  aspirin EC 81 MG tablet Take 81 mg by mouth daily.     [provider]  buPROPion (WELLBUTRIN) 100 MG tablet Take 100 mg by mouth 2 (two) times daily. 11/13/16   [provider]  busPIRone (BUSPAR) 5 MG tablet Take 5 mg by mouth 3 (three) times daily. 11/20/16   [provider]  Cholecalciferol (VITAMIN D PO) Take 1 capsule by mouth every evening.    [provider]  ciprofloxacin (CIPRO) 500 MG tablet Take 1 tablet (500 mg total) by mouth 2 (two) times daily. 01/16/17   Fransico Meadow, PA-C  clonazePAM (KLONOPIN) 1 MG tablet Take 1 tablet (1 mg total) by mouth daily as needed (anxiety/sleep.). 02/15/16   Dorothy Spark, MD  doxycycline (VIBRAMYCIN) 100 MG capsule Take 1 capsule (100 mg total) by mouth 2 (two) times daily. 12/01/16   Doristine Devoid, PA-C  FLUoxetine (PROZAC) 20 MG capsule Take 2 capsules (40 mg total) by mouth daily. Patient taking differently: Take 60 mg by mouth daily.  09/27/16   Rai, Vernelle Emerald, MD  HYDROcodone-acetaminophen (NORCO/VICODIN) 5-325 MG tablet Take 2 tablets by mouth every 4 (four) hours as needed. 01/16/17   Fransico Meadow, PA-C  ibuprofen (ADVIL,MOTRIN) 200 MG tablet Take 400-600 mg by mouth every 6 (six) hours as needed for mild pain or moderate pain.    [provider]  levocetirizine (XYZAL) 5 MG tablet Take 5 mg by mouth every evening.    [provider]  levofloxacin (LEVAQUIN) 500 MG tablet Take 1 tablet (500 mg total) by mouth daily. Patient not taking: Reported on 12/01/2016 11/14/16   Charlann Lange, PA-C  LUTEIN PO Take 1 capsule by mouth every evening.    [provider]  metroNIDAZOLE (FLAGYL) 500 MG tablet Take 1 tablet  (500 mg total) by mouth 2 (two) times daily. 01/16/17   Fransico Meadow, PA-C  mometasone (NASONEX) 50 MCG/ACT nasal spray Place 2 sprays into the nose daily.    [provider]  ondansetron (ZOFRAN ODT) 4 MG disintegrating tablet Take 1 tablet (4 mg total) by mouth every 8 (eight) hours as needed for nausea or vomiting. 01/16/17   Fransico Meadow, PA-C  ondansetron (ZOFRAN) 8 MG tablet Take 8 mg by mouth every 8 (eight) hours as needed for nausea or vomiting.    [provider]  oxyCODONE-acetaminophen (PERCOCET) 10-325 MG tablet Take 1 tablet by mouth every 6 (six) hours as needed  for pain.    [provider]  oxyCODONE-acetaminophen (PERCOCET/ROXICET) 5-325 MG tablet Take 1-2 tablets by mouth every 4 (four) hours as needed for moderate pain or severe pain. Patient not taking: Reported on 12/01/2016 04/30/16   Izora Gala A, PA-C  pantoprazole (PROTONIX) 40 MG tablet Take 1 tablet (40 mg total) by mouth daily at 6 (six) AM. Patient not taking: Reported on 11/26/2016 09/28/16   Rai, Vernelle Emerald, MD  phenazopyridine (PYRIDIUM) 200 MG tablet Take 1 tablet (200 mg total) by mouth 3 (three) times daily. Patient not taking: Reported on 11/26/2016 11/14/16   Charlann Lange, PA-C  RELPAX 40 MG tablet Take 1 tablet by mouth as needed for migraine and may take 1 additional tablet in 2 hours if no relief 01/16/16   [provider]  traZODone (DESYREL) 100 MG tablet Take 200 mg by mouth at bedtime as needed for sleep. 11/12/16   [provider]  vitamin E 100 UNIT capsule Take 100 Units by mouth every evening.    [provider]  zolpidem (AMBIEN) 5 MG tablet Take 1 tablet (5 mg total) by mouth at bedtime as needed for sleep. Patient not taking: Reported on 11/13/2016 09/27/16   Mendel Corning, MD   Meds Ordered and Administered this Visit   Medications  sodium chloride 0.9 % bolus 1,000 mL (0 mLs Intravenous Stopped 01/16/17 2018)  ondansetron (ZOFRAN) injection 4  mg (4 mg Intravenous Given 01/16/17 1955)  morphine 4 MG/ML injection 4 mg (4 mg Intravenous Given 01/16/17 1957)  iopamidol (ISOVUE-300) 61 % injection (100 mLs  Contrast Given 01/16/17 2116)  morphine 4 MG/ML injection 4 mg (4 mg Intravenous Given 01/16/17 2239)  HYDROcodone-acetaminophen (NORCO/VICODIN) 5-325 MG per tablet 2 tablet (2 tablets Oral Given 01/16/17 2244)    BP 119/74   Pulse 72   Resp 18   SpO2 99%  No data found.   Physical Exam  Constitutional: She appears well-developed and well-nourished. No distress.  HENT:  Head: Normocephalic and atraumatic.  Eyes: Conjunctivae are normal.  Neck: Neck supple.  Cardiovascular: Normal rate, regular rhythm and normal heart sounds.   No murmur heard. Pulmonary/Chest: Effort normal and breath sounds normal. No respiratory distress. She has no wheezes.  Abdominal: Soft. She exhibits no distension. There is tenderness. There is rebound.  Musculoskeletal: Normal range of motion. She exhibits no edema.  Neurological: She is alert.  Skin: Skin is warm and dry.  Psychiatric: She has a normal mood and affect.  Nursing note and vitals reviewed.   Urgent Care Course     Procedures (including critical care time)  Labs Review Labs Reviewed  COMPREHENSIVE METABOLIC PANEL - Abnormal; Notable for the following:       Result Value   Calcium 8.8 (*)    Total Protein 6.1 (*)    All other components within normal limits  CBC WITH DIFFERENTIAL/PLATELET  LIPASE, BLOOD  POC OCCULT BLOOD, ED    Imaging Review Ct Abdomen Pelvis W Contrast  Result Date: 01/16/2017 CLINICAL DATA:  66 year old female with abdominal pain and black stools. EXAM: CT ABDOMEN AND PELVIS WITH CONTRAST TECHNIQUE: Multidetector CT imaging of the abdomen and pelvis was performed using the standard protocol following bolus administration of intravenous contrast. CONTRAST:  170mL ISOVUE-300 IOPAMIDOL (ISOVUE-300) INJECTION 61% COMPARISON:  CT of the abdomen pelvis dated  04/03/2016 FINDINGS: Lower chest: There are bibasilar linear atelectasis/ scarring. The visualized lung bases are otherwise clear. No intra-abdominal free air or free fluid. Hepatobiliary: There  is apparent mild fatty infiltration of the liver. No intrahepatic biliary ductal dilatation. The gallbladder is unremarkable. Pancreas: There is mild dilatation of the main pancreatic duct measuring up to 5 mm similar to the prior CT. No discrete pancreatic lesion identified. Correlation with history of prior pancreatitis recommended. MRI may provide better evaluation of the pancreas if clinically indicated. Spleen: Normal in size without focal abnormality. Adrenals/Urinary Tract: There is a subcentimeter left renal cyst. The kidneys are unremarkable. There is symmetric and uniform enhancement and excretion of contrast by the kidneys. The visualized ureters and urinary bladder appear unremarkable. Stomach/Bowel: There is postsurgical changes of partial sigmoid resection with anastomotic suture. There is no evidence of bowel obstruction. Loose stool noted throughout the colon compatible with diarrheal state. There is slight thickening of the rectosigmoid which may represent mild inflammatory changes. The appendix is normal. Vascular/Lymphatic: No significant vascular findings are present. No enlarged abdominal or pelvic lymph nodes. Reproductive: Hysterectomy.  No pelvic masses. Other: Midline vertical anterior pelvic wall incisional scar. Musculoskeletal: Osteopenia with degenerative changes of the spine. No acute fracture. IMPRESSION: 1. Postsurgical changes of partial sigmoid resection. No bowel obstruction. Normal appendix. 2. Mild thickened appearance of the rectosigmoid may represent mild inflammation. Clinical correlation is recommended. 3. Mildly prominent main pancreatic duct. No discrete pancreatic lesion identified. Findings may be sequela of chronic inflammation. MRI may provide better evaluation of the pancreas  if clinically indicated. Electronically Signed   By: Anner Crete M.D.   On: 01/16/2017 21:45   ED course.  Pt given Iv fluids x 1liter, Morphine 4 mg.  Ct and labs reviewed with pt,  Possible inflammation.   I will treat pt with flagyl and cipro.  Pt has a normal hemoglobin and heme negative stools.  Pt is advised to see her MD for recheck in 3-4 daya and to schedule follow up with her GI doctor.  Visual Acuity Review  Right Eye Distance:   Left Eye Distance:   Bilateral Distance:    Right Eye Near:   Left Eye Near:    Bilateral Near:         MDM   1. Abdominal pain, unspecified abdominal location    Meds ordered this encounter  Medications  . sodium chloride 0.9 % bolus 1,000 mL  . ondansetron (ZOFRAN) injection 4 mg  . morphine 4 MG/ML injection 4 mg  . iopamidol (ISOVUE-300) 61 % injection    Gillian Scarce, John   : cabinet override  . DISCONTD: iopamidol (ISOVUE-300) 61 % injection    Gillian Scarce, John   : cabinet override  . ciprofloxacin (CIPRO) 500 MG tablet    Sig: Take 1 tablet (500 mg total) by mouth 2 (two) times daily.    Dispense:  14 tablet    Refill:  0    Order Specific Question:   Supervising Provider    Answer:   MILLER, BRIAN [[id number]]  . metroNIDAZOLE (FLAGYL) 500 MG tablet    Sig: Take 1 tablet (500 mg total) by mouth 2 (two) times daily.    Dispense:  14 tablet    Refill:  0    Order Specific Question:   Supervising Provider    Answer:   MILLER, BRIAN [[id number]]  . ondansetron (ZOFRAN ODT) 4 MG disintegrating tablet    Sig: Take 1 tablet (4 mg total) by mouth every 8 (eight) hours as needed for nausea or vomiting.    Dispense:  20 tablet    Refill:  0    Order  Specific Question:   Supervising Provider    Answer:   Noemi Chapel [[id number]]  . morphine 4 MG/ML injection 4 mg  . HYDROcodone-acetaminophen (NORCO/VICODIN) 5-325 MG per tablet 2 tablet  . HYDROcodone-acetaminophen (NORCO/VICODIN) 5-325 MG tablet    Sig: Take 2 tablets by mouth every 4 (four) hours as  needed.    Dispense:  10 tablet    Refill:  0    Order Specific Question:   Supervising Provider    Answer:   Noemi Chapel [[id number]]   An After Visit Summary was printed and given to the patient.     Fransico Meadow, [place] 01/17/17 [id number]    Malvin Johns, MD 01/17/17 [phone]

## 2017-01-19 NOTE — Telephone Encounter (Signed)
Pt states she had been having dark stools and diarrhea for several days and she went to the ER. States they treated her for diverticulitis and sent her home. Pt had CT of A/P and they tested her stool and it was negative for blood. Pt states she does feel better and is no longer having diarrhea.

## 2017-01-23 ENCOUNTER — Emergency Department (HOSPITAL_COMMUNITY)
Admission: EM | Admit: 2017-01-23 | Discharge: 2017-01-23 | Disposition: A | Discharge status: Home / Self Care | Payer: PPO | Attending: Emergency Medicine | Admitting: Emergency Medicine

## 2017-01-23 ENCOUNTER — Emergency Department (HOSPITAL_COMMUNITY): Payer: PPO

## 2017-01-23 ENCOUNTER — Encounter (HOSPITAL_COMMUNITY): Payer: Self-pay | Admitting: Emergency Medicine

## 2017-01-23 DIAGNOSIS — Y9301 Activity, walking, marching and hiking: Secondary | ICD-10-CM | POA: Diagnosis not present

## 2017-01-23 DIAGNOSIS — Z79899 Other long term (current) drug therapy: Secondary | ICD-10-CM | POA: Diagnosis not present

## 2017-01-23 DIAGNOSIS — F17210 Nicotine dependence, cigarettes, uncomplicated: Secondary | ICD-10-CM | POA: Diagnosis not present

## 2017-01-23 DIAGNOSIS — S0990XA Unspecified injury of head, initial encounter: Secondary | ICD-10-CM

## 2017-01-23 DIAGNOSIS — Y929 Unspecified place or not applicable: Secondary | ICD-10-CM | POA: Diagnosis not present

## 2017-01-23 DIAGNOSIS — G459 Transient cerebral ischemic attack, unspecified: Secondary | ICD-10-CM | POA: Insufficient documentation

## 2017-01-23 DIAGNOSIS — G43109 Migraine with aura, not intractable, without status migrainosus: Secondary | ICD-10-CM

## 2017-01-23 DIAGNOSIS — Y92009 Unspecified place in unspecified non-institutional (private) residence as the place of occurrence of the external cause: Secondary | ICD-10-CM

## 2017-01-23 DIAGNOSIS — Y999 Unspecified external cause status: Secondary | ICD-10-CM | POA: Insufficient documentation

## 2017-01-23 DIAGNOSIS — W19XXXA Unspecified fall, initial encounter: Secondary | ICD-10-CM | POA: Diagnosis not present

## 2017-01-23 DIAGNOSIS — S299XXA Unspecified injury of thorax, initial encounter: Secondary | ICD-10-CM | POA: Diagnosis not present

## 2017-01-23 DIAGNOSIS — R51 Headache: Secondary | ICD-10-CM | POA: Diagnosis not present

## 2017-01-23 DIAGNOSIS — R4182 Altered mental status, unspecified: Secondary | ICD-10-CM | POA: Insufficient documentation

## 2017-01-23 DIAGNOSIS — S199XXA Unspecified injury of neck, initial encounter: Secondary | ICD-10-CM | POA: Diagnosis not present

## 2017-01-23 DIAGNOSIS — R0781 Pleurodynia: Secondary | ICD-10-CM | POA: Diagnosis not present

## 2017-01-23 LAB — I-STAT CHEM 8, ED
BUN: 11 mg/dL (ref 6–20)
CALCIUM ION: 1.18 mmol/L (ref 1.15–1.40)
CHLORIDE: 104 mmol/L (ref 101–111)
CREATININE: 0.8 mg/dL (ref 0.44–1.00)
GLUCOSE: 114 mg/dL — AB (ref 65–99)
HCT: 39 % (ref 36.0–46.0)
Hemoglobin: 13.3 g/dL (ref 12.0–15.0)
POTASSIUM: 4.1 mmol/L (ref 3.5–5.1)
Sodium: 142 mmol/L (ref 135–145)
TCO2: 25 mmol/L (ref 0–100)

## 2017-01-23 LAB — PROTIME-INR
INR: 1
Prothrombin Time: 13.2 seconds (ref 11.4–15.2)

## 2017-01-23 LAB — DIFFERENTIAL
BASOS ABS: 0 10*3/uL (ref 0.0–0.1)
Basophils Relative: 0 %
Eosinophils Absolute: 0.3 10*3/uL (ref 0.0–0.7)
Eosinophils Relative: 5 %
LYMPHS PCT: 23 %
Lymphs Abs: 1.6 10*3/uL (ref 0.7–4.0)
MONO ABS: 0.8 10*3/uL (ref 0.1–1.0)
Monocytes Relative: 11 %
NEUTROS ABS: 4.2 10*3/uL (ref 1.7–7.7)
Neutrophils Relative %: 61 %

## 2017-01-23 LAB — CBC
HEMATOCRIT: 39.6 % (ref 36.0–46.0)
Hemoglobin: 12.7 g/dL (ref 12.0–15.0)
MCH: 28.2 pg (ref 26.0–34.0)
MCHC: 32.1 g/dL (ref 30.0–36.0)
MCV: 87.8 fL (ref 78.0–100.0)
PLATELETS: 272 10*3/uL (ref 150–400)
RBC: 4.51 MIL/uL (ref 3.87–5.11)
RDW: 14.8 % (ref 11.5–15.5)
WBC: 6.9 10*3/uL (ref 4.0–10.5)

## 2017-01-23 LAB — I-STAT TROPONIN, ED: Troponin i, poc: 0.01 ng/mL (ref 0.00–0.08)

## 2017-01-23 LAB — COMPREHENSIVE METABOLIC PANEL
ALK PHOS: 99 U/L (ref 38–126)
ALT: 13 U/L — AB (ref 14–54)
AST: 19 U/L (ref 15–41)
Albumin: 3.8 g/dL (ref 3.5–5.0)
Anion gap: 10 (ref 5–15)
BUN: 11 mg/dL (ref 6–20)
CALCIUM: 9.3 mg/dL (ref 8.9–10.3)
CO2: 24 mmol/L (ref 22–32)
CREATININE: 0.9 mg/dL (ref 0.44–1.00)
Chloride: 106 mmol/L (ref 101–111)
GFR calc non Af Amer: >60 mL/min (ref >60)
GLUCOSE: 115 mg/dL — AB (ref 65–99)
Potassium: 4.1 mmol/L (ref 3.5–5.1)
Sodium: 140 mmol/L (ref 135–145)
Total Bilirubin: 0.7 mg/dL (ref 0.3–1.2)
Total Protein: 6.6 g/dL (ref 6.5–8.1)

## 2017-01-23 LAB — APTT: APTT: 25 s (ref 24–36)

## 2017-01-23 MED ORDER — KETOROLAC TROMETHAMINE 30 MG/ML IJ SOLN
30.0000 mg | Freq: Once | INTRAMUSCULAR | Status: AC
  Administered 2017-01-23: 30 mg via INTRAVENOUS
  Filled 2017-01-23: qty 1

## 2017-01-23 MED ORDER — PROCHLORPERAZINE EDISYLATE 5 MG/ML IJ SOLN
10.0000 mg | Freq: Once | INTRAMUSCULAR | Status: AC
  Administered 2017-01-23: 10 mg via INTRAVENOUS
  Filled 2017-01-23: qty 2

## 2017-01-23 MED ORDER — DIPHENHYDRAMINE HCL 50 MG/ML IJ SOLN
25.0000 mg | Freq: Once | INTRAMUSCULAR | Status: AC
  Administered 2017-01-23: 25 mg via INTRAVENOUS
  Filled 2017-01-23: qty 1

## 2017-01-23 MED ORDER — SODIUM CHLORIDE 0.9 % IV BOLUS (SEPSIS)
1000.0000 mL | Freq: Once | INTRAVENOUS | Status: AC
  Administered 2017-01-23: 1000 mL via INTRAVENOUS

## 2017-01-23 MED ORDER — MAGNESIUM SULFATE 2 GM/50ML IV SOLN
2.0000 g | Freq: Once | INTRAVENOUS | Status: AC
  Administered 2017-01-23: 2 g via INTRAVENOUS
  Filled 2017-01-23: qty 50

## 2017-01-23 MED ORDER — OXYCODONE-ACETAMINOPHEN 5-325 MG PO TABS
1.0000 | ORAL_TABLET | Freq: Once | ORAL | Status: AC
  Administered 2017-01-23: 1 via ORAL
  Filled 2017-01-23: qty 1

## 2017-01-23 NOTE — ED Notes (Signed)
Pt able to ambulate without assitance.

## 2017-01-23 NOTE — ED Provider Notes (Signed)
[facility] DEPT Provider Note   CSN: [id number] Arrival date & time: 01/23/17  1451     History   Chief Complaint Chief Complaint  Patient presents with  . Altered Mental Status    HPI   Blood pressure 118/61, pulse 67, temperature 98.8 F (37.1 C), temperature source Oral, resp. rate 11, height 5\' 5"  (1.651 m), weight 54 kg (119 lb), SpO2 98 %.  Dorothy White is a 66 y.o. female with extensive migraine history including hemiplegic migraine she states that she had 1 onset last evening at 2030 and she began stumbling and had multiple falls because she couldn't walk. The headache is on the left temple, this is typical for her prior headaches associated with nausea and some mild confusion but no true visual changes, nausea, vomiting, numbness, weakness, chest pain, palpitations. Tetanus shot was within the last 10 years. She doesn't have any muscular skeletal pain secondary to issues with falling. Patient states that she took Relpax for her headaches. She states that she doesn't see a neurologist but has an appointment in the next few weeks.  Past Medical History:  Diagnosis Date  . Anemia    ?  Marland Kitchen Anxiety   . Arthritis    "everywhere" (04/25/2016)  . 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 Chronic back pain    "all over" (04/25/2016)  . Constipation   . Depression   . Diverticulitis   . GERD (gastroesophageal reflux disease)   . Headache    "weekly" (04/25/2016)  . HLD (hyperlipidemia)    hx (04/25/2016)  . 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 Mild CAD    a. 25% mLAD, otherwise no sig disease 01/2016 cath.  Marland Kitchen NICM (nonischemic cardiomyopathy) ([place])    a. EF 40-45% by echo 03/4495 at time of complicated migraine/neuro sx, 55-65% at time of cath 01/2016  . Stroke [facility]) 06/2013   "mini" stroke , ?possibly hemaplegic migraine per pt    Patient Active Problem List   Diagnosis Date Noted  . Chest pain  09/26/2016  . Diverticulitis of sigmoid colon 04/25/2016  . Infection due to ESBL-producing Escherichia coli/Diverticular Abscess 03/15/2016  . Diverticulitis 03/12/2016  . Chronic pain syndrome 03/12/2016  . Lesion of pancreas 03/12/2016  . History of chest pain 03/12/2016  . Hypokalemia 03/12/2016  . Angina, class IV ([place]) - chest tightness and pressure with dyspnea with minimal exertion 02/17/2016    Class: Question of  . TIA (transient ischemic attack) 12/26/2015  . DOE (dyspnea on exertion) 12/26/2015  . Right sided weakness 12/17/2015  . Ataxia 12/17/2015  . Shoulder fracture 02/15/2015  . Chronic headache 10/11/2013  . Weakness generalized 08/12/2013  . Dizziness and giddiness 08/12/2013  . Abnormality of gait 07/11/2013  . Hyperlipidemia 05/07/2007  . COMMON MIGRAINE 05/07/2007  . PREMATURE VENTRICULAR CONTRACTIONS, FREQUENT 05/07/2007  . GERD 05/07/2007  . DIVERTICULOSIS, COLON 05/07/2007  . 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 OSTEOPENIA 05/07/2007  . Anxiety and depression 03/24/2007  . HEMORRHOIDS 03/24/2007  . ALLERGIC RHINITIS 03/24/2007  . LOW BACK PAIN 03/24/2007  . MIGRAINES, HX OF 03/24/2007    Past Surgical History:  Procedure Laterality Date  . AUGMENTATION MAMMAPLASTY  1980  . BASAL CELL CARCINOMA EXCISION     "tip of my nose"  . BUNIONECTOMY Bilateral 10/2003  . CARDIAC CATHETERIZATION N/A 02/20/2016   Procedure: Left Heart Cath and Coronary Angiography;  Surgeon: Jettie Booze, MD;  Location: [facility];  Service: Cardiovascular;  Laterality: N/A;  . COLONOSCOPY    . DILATION AND CURETTAGE OF UTERUS    . FRACTURE SURGERY    . IR GENERIC HISTORICAL  03/18/2016   IR SINUS/FIST TUBE CHK-NON GI 03/18/2016 Aletta Edouard, MD [facility]  . LAPAROSCOPIC SIGMOID COLECTOMY N/A 04/25/2016   Procedure: LAPAROSCOPIC SIGMOID COLECTOMY;  Surgeon: Stark Klein, MD;  Location: [facility];  Service: General;  Laterality: N/A;  . OOPHORECTOMY  Bilateral ~ 1999  . ORIF HUMERUS FRACTURE Right 02/15/2015   Procedure: OPEN REDUCTION INTERNAL FIXATION (ORIF) PROXIMAL HUMERUS FRACTURE;  Surgeon: Netta Cedars, MD;  Location: [facility];  Service: Orthopedics;  Laterality: Right;  . RHINOPLASTY  1976  . TONSILLECTOMY    . TUBAL LIGATION  ~ 1983  . VAGINAL HYSTERECTOMY  ~ 1998    OB History    No data available       Home Medications    Prior to Admission medications   Medication Sig Start Date End Date Taking? Authorizing Provider  buPROPion (WELLBUTRIN) 100 MG tablet Take 200 mg by mouth daily.  11/13/16  Yes [provider]  clonazePAM (KLONOPIN) 1 MG tablet Take 1 tablet (1 mg total) by mouth daily as needed (anxiety/sleep.). 02/15/16  Yes Dorothy Spark, MD  FLUoxetine (PROZAC) 20 MG capsule Take 2 capsules (40 mg total) by mouth daily. Patient taking differently: Take 60 mg by mouth daily.  09/27/16  Yes Rai, Ripudeep K, MD  HYDROcodone-acetaminophen (NORCO/VICODIN) 5-325 MG tablet Take 2 tablets by mouth every 4 (four) hours as needed. 01/16/17  Yes Caryl Ada K, PA-C  levocetirizine (XYZAL) 5 MG tablet Take 5 mg by mouth every evening.   Yes [provider]  LUTEIN PO Take 1 capsule by mouth every evening.   Yes [provider]  mometasone (NASONEX) 50 MCG/ACT nasal spray Place 2 sprays into the nose daily.   Yes [provider]  ondansetron (ZOFRAN) 8 MG tablet Take 8 mg by mouth every 8 (eight) hours as needed for nausea or vomiting.   Yes [provider]  oxyCODONE-acetaminophen (PERCOCET) 10-325 MG tablet Take 1 tablet by mouth every 6 (six) hours as needed for pain.   Yes [provider]  traZODone (DESYREL) 100 MG tablet Take 200 mg by mouth at bedtime as needed for sleep. 11/12/16  Yes [provider]  vitamin E 100 UNIT capsule Take 100 Units by mouth every evening.   Yes [provider]  metroNIDAZOLE (FLAGYL) 500 MG tablet Take 1 tablet (500 mg  total) by mouth 2 (two) times daily. Patient not taking: Reported on 01/23/2017 01/16/17   Fransico Meadow, PA-C  RELPAX 40 MG tablet Take 1 tablet by mouth as needed for migraine and may take 1 additional tablet in 2 hours if no relief 01/16/16   [provider]    Family History Family History  Problem Relation Age of Onset  . Lung cancer Mother 77  . Heart attack Father        Vague  . Hypertension Brother   . Esophageal cancer Brother   . Diabetes Paternal Grandmother        questionable  . Colon cancer Neg Hx   . Kidney disease Neg Hx   . Liver disease Neg Hx     Social History Social History  Substance Use Topics  . Smoking status: Current Some Day Smoker    Years: 34.00    Types: Cigarettes  .  Smokeless tobacco: Never Used     Comment: 04/25/2016 "2 cigarettes/wik sometimes; sometimes nothing for months; real smoker til 2014"  . Alcohol use No     Comment: Denies 08/24/16     Allergies   Penicillins   Review of Systems Review of Systems  A complete review of systems was obtained and all systems are negative except as noted in the HPI and PMH.    Physical Exam Updated Vital Signs BP 104/65 (BP Location: Left Arm)   Pulse 60   Temp 98.8 F (37.1 C) (Oral)   Resp 14   Ht 5\' 5"  (1.651 m)   Wt 54 kg (119 lb)   SpO2 96%   BMI 19.80 kg/m   Physical Exam  Constitutional: She is oriented to person, place, and time. She appears well-developed and well-nourished.  HENT:  Head: Normocephalic and atraumatic.  Mouth/Throat: Oropharynx is clear and moist.  No abrasions or contusions.   No hemotympanum, battle signs or raccoon's eyes  No crepitance or tenderness to palpation along the orbital rim.  EOMI intact with no pain or diplopia  No abnormal otorrhea or rhinorrhea. Nasal septum midline.  No intraoral trauma.     Eyes: Conjunctivae and EOM are normal. Pupils are equal, round, and reactive to light.  No TTP of maxillary or frontal  sinuses  No TTP or induration of temporal arteries bilaterally  Neck: Normal range of motion. Neck supple.  FROM to C-spine. Pt can touch chin to chest without discomfort. No TTP of midline cervical spine.   Cardiovascular: Normal rate, regular rhythm and intact distal pulses.   Pulmonary/Chest: Effort normal and breath sounds normal. No respiratory distress. She has no wheezes. She has no rales. She exhibits no tenderness.  Abdominal: Soft. Bowel sounds are normal. There is no tenderness.  Musculoskeletal: Normal range of motion. She exhibits no edema or tenderness.  Neurological: She is alert and oriented to person, place, and time. No cranial nerve deficit.  II-Visual fields grossly intact. III/IV/VI-Extraocular movements intact.  Pupils reactive bilaterally. V/VII-Smile symmetric, equal eyebrow raise,  facial sensation intact VIII- Hearing grossly intact IX/X-Normal gag XI-bilateral shoulder shrug XII-midline tongue extension Motor: 5/5 bilaterally with normal tone and bulk Cerebellar: Normal finger-to-nose  and normal heel-to-shin test.   Ambulates with ataxic gait.   Nursing note and vitals reviewed.    ED Treatments / Results  Labs (all labs ordered are listed, but only abnormal results are displayed) Labs Reviewed  COMPREHENSIVE METABOLIC PANEL - Abnormal; Notable for the following:       Result Value   Glucose, Bld 115 (*)    ALT 13 (*)    All other components within normal limits  I-STAT CHEM 8, ED - Abnormal; Notable for the following:    Glucose, Bld 114 (*)    All other components within normal limits  PROTIME-INR  APTT  CBC  DIFFERENTIAL  I-STAT TROPOININ, ED  CBG MONITORING, ED    EKG  EKG Interpretation  Date/Time:  Friday January 23 2017 15:06:32 EDT Ventricular Rate:  74 PR Interval:    QRS Duration: 81 QT Interval:  298 QTC Calculation: 331 R Axis:   70 Text Interpretation:  Sinus rhythm Abnormal T, consider ischemia, lateral leads Baseline  wander in lead(s) II III aVF Wandering baseline, TW changes in lateral leads no longer present Confirmed by Gareth Morgan [phone]) on 01/23/2017 4:19:27 PM       Radiology Dg Ribs Unilateral W/chest Left  Result Date: 01/23/2017 CLINICAL DATA:  Acute chest and left rib pain following fall yesterday. Initial encounter. EXAM: LEFT RIBS AND CHEST - 3+ VIEW COMPARISON:  12/25/2016 and prior exams FINDINGS: The cardiomediastinal silhouette is unremarkable. There is no evidence of focal airspace disease, pulmonary edema, suspicious pulmonary nodule/mass, pleural effusion, or pneumothorax. No acute fracture or rib abnormality. Bilateral breast prosthesis identified. IMPRESSION: No acute abnormality.  No evidence of acute rib fracture. Electronically Signed   By: Margarette Canada M.D.   On: 01/23/2017 18:52   Ct Head Wo Contrast  Result Date: 01/23/2017 CLINICAL DATA:  Blurry vision, word-finding difficulty and leg weakness beginning at 2030 hours yesterday. Multiple falls and head injury, mild headache. History of migraines, stroke. EXAM: CT HEAD WITHOUT CONTRAST CT CERVICAL SPINE WITHOUT CONTRAST TECHNIQUE: Multidetector CT imaging of the head and cervical spine was performed following the standard protocol without intravenous contrast. Multiplanar CT image reconstructions of the cervical spine were also generated. COMPARISON:  MRI of the head Dec 17, 2016 and MRI of the cervical spine Dec 19, 2015 FINDINGS: CT HEAD FINDINGS BRAIN: No intraparenchymal hemorrhage, mass effect nor midline shift. The ventricles and sulci are normal for age. No acute large vascular territory infarcts. Beam hardening artifact through the pons most conspicuous on sagittal reformation. No abnormal extra-axial fluid collections. Basal cisterns are patent. VASCULAR: Trace calcific atherosclerosis of the carotid siphons. SKULL: No skull fracture. No significant scalp soft tissue swelling. SINUSES/ORBITS: The mastoid air-cells and included  paranasal sinuses are well-aerated.The included ocular globes and orbital contents are non-suspicious. OTHER: None. CT CERVICAL SPINE FINDINGS ALIGNMENT: Maintained lordosis. Vertebral bodies in alignment. SKULL BASE AND VERTEBRAE: Cervical vertebral bodies and posterior elements are intact. Mild C6-7 disc height loss with mild ventral endplate spurring O1-6 and C6-7. Moderate RIGHT C2-3 and LEFT C7-T1 facet arthropathy. No destructive bony lesions. C1-2 articulation maintained. SOFT TISSUES AND SPINAL CANAL: Normal. DISC LEVELS: No significant osseous canal stenosis. Mild LEFT greater than RIGHT C6-7 neural foraminal narrowing. UPPER CHEST: Lung apices are clear. OTHER: None. IMPRESSION: CT HEAD: Negative for age. CT CERVICAL SPINE:  Negative for age. Electronically Signed   By: Elon Alas M.D.   On: 01/23/2017 17:23   Ct Cervical Spine Wo Contrast  Result Date: 01/23/2017 CLINICAL DATA:  Blurry vision, word-finding difficulty and leg weakness beginning at 2030 hours yesterday. Multiple falls and head injury, mild headache. History of migraines, stroke. EXAM: CT HEAD WITHOUT CONTRAST CT CERVICAL SPINE WITHOUT CONTRAST TECHNIQUE: Multidetector CT imaging of the head and cervical spine was performed following the standard protocol without intravenous contrast. Multiplanar CT image reconstructions of the cervical spine were also generated. COMPARISON:  MRI of the head Dec 17, 2016 and MRI of the cervical spine Dec 19, 2015 FINDINGS: CT HEAD FINDINGS BRAIN: No intraparenchymal hemorrhage, mass effect nor midline shift. The ventricles and sulci are normal for age. No acute large vascular territory infarcts. Beam hardening artifact through the pons most conspicuous on sagittal reformation. No abnormal extra-axial fluid collections. Basal cisterns are patent. VASCULAR: Trace calcific atherosclerosis of the carotid siphons. SKULL: No skull fracture. No significant scalp soft tissue swelling. SINUSES/ORBITS: The  mastoid air-cells and included paranasal sinuses are well-aerated.The included ocular globes and orbital contents are non-suspicious. OTHER: None. CT CERVICAL SPINE FINDINGS ALIGNMENT: Maintained lordosis. Vertebral bodies in alignment. SKULL BASE AND VERTEBRAE: Cervical vertebral bodies and posterior elements are intact. Mild C6-7 disc height loss with mild ventral endplate spurring W7-3 and C6-7. Moderate RIGHT C2-3 and LEFT C7-T1 facet arthropathy. No destructive bony lesions.  C1-2 articulation maintained. SOFT TISSUES AND SPINAL CANAL: Normal. DISC LEVELS: No significant osseous canal stenosis. Mild LEFT greater than RIGHT C6-7 neural foraminal narrowing. UPPER CHEST: Lung apices are clear. OTHER: None. IMPRESSION: CT HEAD: Negative for age. CT CERVICAL SPINE:  Negative for age. Electronically Signed   By: Elon Alas M.D.   On: 01/23/2017 17:23    Procedures Procedures (including critical care time)  Medications Ordered in ED Medications  prochlorperazine (COMPAZINE) injection 10 mg (10 mg Intravenous Given 01/23/17 1722)  diphenhydrAMINE (BENADRYL) injection 25 mg (25 mg Intravenous Given 01/23/17 1721)  ketorolac (TORADOL) 30 MG/ML injection 30 mg (30 mg Intravenous Given 01/23/17 1725)  magnesium sulfate IVPB 2 g 50 mL (0 g Intravenous Stopped 01/23/17 1944)  sodium chloride 0.9 % bolus 1,000 mL (0 mLs Intravenous Stopped 01/23/17 1821)  oxyCODONE-acetaminophen (PERCOCET/ROXICET) 5-325 MG per tablet 1 tablet (1 tablet Oral Given 01/23/17 1843)     Initial Impression / Assessment and Plan / ED Course  I have reviewed the triage vital signs and the nursing notes.  Pertinent labs & imaging results that were available during my care of the patient were reviewed by me and considered in my medical decision making (see chart for details).    Vitals:   01/23/17 1635 01/23/17 1700 01/23/17 1800 01/23/17 2023  BP: 112/63 119/64 (!) 105/48 104/65  Pulse: 66 65 62 60  Resp: 14 20 14 14   Temp:       TempSrc:      SpO2: 97% 97% 98% 96%  Weight:      Height:        Medications  prochlorperazine (COMPAZINE) injection 10 mg (10 mg Intravenous Given 01/23/17 1722)  diphenhydrAMINE (BENADRYL) injection 25 mg (25 mg Intravenous Given 01/23/17 1721)  ketorolac (TORADOL) 30 MG/ML injection 30 mg (30 mg Intravenous Given 01/23/17 1725)  magnesium sulfate IVPB 2 g 50 mL (0 g Intravenous Stopped 01/23/17 1944)  sodium chloride 0.9 % bolus 1,000 mL (0 mLs Intravenous Stopped 01/23/17 1821)  oxyCODONE-acetaminophen (PERCOCET/ROXICET) 5-325 MG per tablet 1 tablet (1 tablet Oral Given 01/23/17 1843)    Dorothy White is 66 y.o. female presenting with Migraine with ataxia, multiple falls over the last day. She is ataxic on my exam but with normal strength sensation and cerebellar exam is otherwise normal. She is very clear that this is consistent with prior hemiplegic migraines. She was seen for similar several months ago and had negative head CT and MRI at that time. No objective signs of head trauma. Patient has had negative CT head and C-spine. She reports a slight improvement in her headache with headache cocktail however she is now reporting pain beneath the bilateral armpits and chest she thinks it's because she had some much difficulty walking that she was using her arms to pull herself. She denies any shortness of breath and this pain is worsened when she moves her arms. She states is worse on the left than the right. I will obtain ribs plain films.  Plain films negative, she states that the headache is improved with the Percocet. I watched this patient ambulate again after pain has improved and she is still ataxic. I have recommended an MRI for further evaluation, patient declines this she states that she wouldn't have come in today except she was worried about hitting her head so many times. She does not think she is having a stroke although she had a prior stroke in the past which she describes  as "a  mini stroke." Patient is alert, oriented 3 not intoxicated and can meaningfully discussed the pros and cons of foregoing treatment. She understands the risks and benefits of foregoing MRI and has capacity to make decision-making. She has chosen to leave the ED without MRI and I have invited her to return at any time if she changes her mind. I've asked her to follow closely both with her primary care physician and her neurologist. Patient verbalized understanding and teach back technique.     Final Clinical Impressions(s) / ED Diagnoses   Final diagnoses:  Complicated migraine  Fall at home, initial encounter  Traumatic injury of head, initial encounter    New Prescriptions Discharge Medication List as of 01/23/2017  8:10 PM       Johndaniel Catlin, Charna Cayle 01/23/17 2058    Gareth Morgan, MD 01/27/17 1313

## 2017-01-23 NOTE — ED Notes (Signed)
Removed IV from Rt Forearm.

## 2017-01-23 NOTE — Discharge Instructions (Signed)
Please follow with your primary care doctor in the next 2 days for a check-up. They must obtain records for further management.  ° °Do not hesitate to return to the Emergency Department for any new, worsening or concerning symptoms.  ° °

## 2017-01-23 NOTE — ED Notes (Signed)
Writer states that patient walked from room to bathroom w/o assistance, however, patient is a little unsteady. Uses the hand rails for balance when walking in hallway.

## 2017-01-23 NOTE — ED Triage Notes (Signed)
Pt verbalizes onset of blurred vision, unable to move legs, and trouble getting words together 2030 last night. Pt verbalizes multiple falls last night and hit head with falls. Pt delayed responses with triage; complaint of only mild headache and trouble getting words together at present time. Pt denies numbness throughout face and extremities, all extremities strong, no facial asymmetry noted.

## 2017-02-03 DIAGNOSIS — M5012 Mid-cervical disc disorder, unspecified level: Secondary | ICD-10-CM | POA: Diagnosis not present

## 2017-02-11 ENCOUNTER — Ambulatory Visit: Payer: PPO | Admitting: Neurology

## 2017-02-12 ENCOUNTER — Encounter (HOSPITAL_COMMUNITY): Payer: Self-pay | Admitting: Emergency Medicine

## 2017-02-12 ENCOUNTER — Emergency Department (HOSPITAL_COMMUNITY)
Admission: EM | Admit: 2017-02-12 | Discharge: 2017-02-12 | Disposition: A | Discharge status: Home / Self Care | Payer: PPO | Attending: Emergency Medicine | Admitting: Emergency Medicine

## 2017-02-12 DIAGNOSIS — M549 Dorsalgia, unspecified: Secondary | ICD-10-CM | POA: Diagnosis not present

## 2017-02-12 DIAGNOSIS — Z79899 Other long term (current) drug therapy: Secondary | ICD-10-CM | POA: Insufficient documentation

## 2017-02-12 DIAGNOSIS — F17210 Nicotine dependence, cigarettes, uncomplicated: Secondary | ICD-10-CM | POA: Insufficient documentation

## 2017-02-12 DIAGNOSIS — R110 Nausea: Secondary | ICD-10-CM | POA: Insufficient documentation

## 2017-02-12 DIAGNOSIS — G8929 Other chronic pain: Secondary | ICD-10-CM | POA: Diagnosis not present

## 2017-02-12 DIAGNOSIS — R404 Transient alteration of awareness: Secondary | ICD-10-CM | POA: Diagnosis not present

## 2017-02-12 DIAGNOSIS — R630 Anorexia: Secondary | ICD-10-CM | POA: Insufficient documentation

## 2017-02-12 DIAGNOSIS — F419 Anxiety disorder, unspecified: Secondary | ICD-10-CM | POA: Insufficient documentation

## 2017-02-12 DIAGNOSIS — R531 Weakness: Secondary | ICD-10-CM | POA: Diagnosis not present

## 2017-02-12 DIAGNOSIS — R112 Nausea with vomiting, unspecified: Secondary | ICD-10-CM | POA: Diagnosis not present

## 2017-02-12 LAB — URINALYSIS, ROUTINE W REFLEX MICROSCOPIC
Bilirubin Urine: NEGATIVE
Glucose, UA: NEGATIVE mg/dL
HGB URINE DIPSTICK: NEGATIVE
Ketones, ur: NEGATIVE mg/dL
LEUKOCYTES UA: NEGATIVE
NITRITE: NEGATIVE
PROTEIN: NEGATIVE mg/dL
Specific Gravity, Urine: 1.014 (ref 1.005–1.030)
pH: 5 (ref 5.0–8.0)

## 2017-02-12 LAB — COMPREHENSIVE METABOLIC PANEL
ALK PHOS: 99 U/L (ref 38–126)
ALT: 19 U/L (ref 14–54)
ANION GAP: 10 (ref 5–15)
AST: 19 U/L (ref 15–41)
Albumin: 3.8 g/dL (ref 3.5–5.0)
BILIRUBIN TOTAL: 0.7 mg/dL (ref 0.3–1.2)
BUN: 11 mg/dL (ref 6–20)
CALCIUM: 9.3 mg/dL (ref 8.9–10.3)
CO2: 26 mmol/L (ref 22–32)
Chloride: 104 mmol/L (ref 101–111)
Creatinine, Ser: 0.88 mg/dL (ref 0.44–1.00)
GFR calc non Af Amer: >60 mL/min (ref >60)
Glucose, Bld: 100 mg/dL — ABNORMAL HIGH (ref 65–99)
Potassium: 3.7 mmol/L (ref 3.5–5.1)
SODIUM: 140 mmol/L (ref 135–145)
TOTAL PROTEIN: 6.8 g/dL (ref 6.5–8.1)

## 2017-02-12 LAB — CBC
HCT: 39.2 % (ref 36.0–46.0)
Hemoglobin: 12.7 g/dL (ref 12.0–15.0)
MCH: 28.4 pg (ref 26.0–34.0)
MCHC: 32.4 g/dL (ref 30.0–36.0)
MCV: 87.7 fL (ref 78.0–100.0)
PLATELETS: 228 10*3/uL (ref 150–400)
RBC: 4.47 MIL/uL (ref 3.87–5.11)
RDW: 14.6 % (ref 11.5–15.5)
WBC: 6 10*3/uL (ref 4.0–10.5)

## 2017-02-12 LAB — LIPASE, BLOOD: Lipase: 13 U/L (ref 11–51)

## 2017-02-12 MED ORDER — ONDANSETRON 4 MG PO TBDP
4.0000 mg | ORAL_TABLET | Freq: Once | ORAL | Status: AC
  Administered 2017-02-12: 4 mg via ORAL
  Filled 2017-02-12: qty 1

## 2017-02-12 MED ORDER — OXYCODONE-ACETAMINOPHEN 5-325 MG PO TABS
1.0000 | ORAL_TABLET | Freq: Once | ORAL | Status: AC
  Administered 2017-02-12: 1 via ORAL
  Filled 2017-02-12: qty 1

## 2017-02-12 NOTE — ED Triage Notes (Signed)
Pt from home via EMS with complaints of anxiety and nausea. Pt states she has not eaten x 4 days. Pt states she has been off her prozac 1 month ago. Prior to that she had been taking her prozac for 35 years

## 2017-02-12 NOTE — ED Provider Notes (Signed)
[facility] DEPT Provider Note   CSN: [id number] Arrival date & time: 02/12/17  1357     History   Chief Complaint Chief Complaint  Patient presents with  . Anxiety    HPI Dorothy White is a 66 y.o. female.  66yo F w/ PMH including CAD, NICM, anxiety/depression, chronic back pain who p/w anxiety and nausea. Pt states that her friend died 2 months ago and since then her anxiety has been getting worse. She has been on Prozac for 35 years but stopped taking it 1 month ago because she felt like it wasn't working. She reports good sleep, normal daily functioning, no alcohol or drug use. She denies SI, HI, AVH, or severe depression. Pt also reports several days of nausea, no vomiting but she has not eaten for 4 days. She had several episodes of diarrhea yesterday and today. She had central abdominal pain a few days ago but it resolved and she has none currently. She complaints of chronic back pain. She reports mild burning with urination since she had a Bartholin's cyst drained.   The history is provided by the patient.    Past Medical History:  Diagnosis Date  . Anemia    ?  Marland Kitchen Anxiety   . Arthritis    "everywhere" (04/25/2016)  . 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 Chronic back pain    "all over" (04/25/2016)  . Constipation   . Depression   . Diverticulitis   . GERD (gastroesophageal reflux disease)   . Headache    "weekly" (04/25/2016)  . HLD (hyperlipidemia)    hx (04/25/2016)  . 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 Mild CAD    a. 25% mLAD, otherwise no sig disease 01/2016 cath.  Marland Kitchen NICM (nonischemic cardiomyopathy) ([place])    a. EF 40-45% by echo 07/256 at time of complicated migraine/neuro sx, 55-65% at time of cath 01/2016  . Stroke [facility]) 06/2013   "mini" stroke , ?possibly hemaplegic migraine per pt    Patient Active Problem List   Diagnosis Date Noted  . Chest pain 09/26/2016  . Diverticulitis  of sigmoid colon 04/25/2016  . Infection due to ESBL-producing Escherichia coli/Diverticular Abscess 03/15/2016  . Diverticulitis 03/12/2016  . Chronic pain syndrome 03/12/2016  . Lesion of pancreas 03/12/2016  . History of chest pain 03/12/2016  . Hypokalemia 03/12/2016  . Angina, class IV ([place]) - chest tightness and pressure with dyspnea with minimal exertion 02/17/2016    Class: Question of  . TIA (transient ischemic attack) 12/26/2015  . DOE (dyspnea on exertion) 12/26/2015  . Right sided weakness 12/17/2015  . Ataxia 12/17/2015  . Shoulder fracture 02/15/2015  . Chronic headache 10/11/2013  . Weakness generalized 08/12/2013  . Dizziness and giddiness 08/12/2013  . Abnormality of gait 07/11/2013  . Hyperlipidemia 05/07/2007  . COMMON MIGRAINE 05/07/2007  . PREMATURE VENTRICULAR CONTRACTIONS, FREQUENT 05/07/2007  . GERD 05/07/2007  . DIVERTICULOSIS, COLON 05/07/2007  . 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 OSTEOPENIA 05/07/2007  . Anxiety and depression 03/24/2007  . HEMORRHOIDS 03/24/2007  . ALLERGIC RHINITIS 03/24/2007  . LOW BACK PAIN 03/24/2007  . MIGRAINES, HX OF 03/24/2007    Past Surgical History:  Procedure Laterality Date  . AUGMENTATION MAMMAPLASTY  1980  . BASAL CELL CARCINOMA EXCISION     "tip of my nose"  . BUNIONECTOMY Bilateral 10/2003  . CARDIAC CATHETERIZATION N/A 02/20/2016   Procedure: Left Heart Cath and Coronary Angiography;  Surgeon: Jettie Booze, MD;  Location: [facility];  Service: Cardiovascular;  Laterality: N/A;  . COLONOSCOPY    . DILATION AND CURETTAGE OF UTERUS    . FRACTURE SURGERY    . IR GENERIC HISTORICAL  03/18/2016   IR SINUS/FIST TUBE CHK-NON GI 03/18/2016 Aletta Edouard, MD [facility]-INTERV RAD  . LAPAROSCOPIC SIGMOID COLECTOMY N/A 04/25/2016   Procedure: LAPAROSCOPIC SIGMOID COLECTOMY;  Surgeon: Stark Klein, MD;  Location: [facility];  Service: General;  Laterality: N/A;  . OOPHORECTOMY Bilateral ~ 1999  . ORIF HUMERUS  FRACTURE Right 02/15/2015   Procedure: OPEN REDUCTION INTERNAL FIXATION (ORIF) PROXIMAL HUMERUS FRACTURE;  Surgeon: Netta Cedars, MD;  Location: [facility];  Service: Orthopedics;  Laterality: Right;  . RHINOPLASTY  1976  . TONSILLECTOMY    . TUBAL LIGATION  ~ 1983  . VAGINAL HYSTERECTOMY  ~ 1998    OB History    No data available       Home Medications    Prior to Admission medications   Medication Sig Start Date End Date Taking? Authorizing Provider  buPROPion (WELLBUTRIN XL) 150 MG 24 hr tablet Take 150 mg by mouth daily. 01/16/17  Yes [provider]  clonazePAM (KLONOPIN) 1 MG tablet Take 1 tablet (1 mg total) by mouth daily as needed (anxiety/sleep.). 02/15/16  Yes Dorothy Spark, MD  levocetirizine (XYZAL) 5 MG tablet Take 5 mg by mouth every evening.   Yes [provider]  LUTEIN PO Take 1 capsule by mouth every evening.   Yes [provider]  mometasone (NASONEX) 50 MCG/ACT nasal spray Place 2 sprays into the nose daily.   Yes [provider]  ondansetron (ZOFRAN) 8 MG tablet Take 8 mg by mouth every 8 (eight) hours as needed for nausea or vomiting.   Yes [provider]  oxyCODONE-acetaminophen (PERCOCET) 10-325 MG tablet Take 1 tablet by mouth every 6 (six) hours as needed for pain.   Yes [provider]  RELPAX 40 MG tablet Take 1 tablet by mouth as needed for migraine and may take 1 additional tablet in 2 hours if no relief 01/16/16  Yes [provider]  traZODone (DESYREL) 100 MG tablet Take 200 mg by mouth at bedtime as needed for sleep. 11/12/16  Yes [provider]  vitamin E 100 UNIT capsule Take 100 Units by mouth every evening.   Yes [provider]  FLUoxetine (PROZAC) 20 MG capsule Take 2 capsules (40 mg total) by mouth daily. Patient not taking: Reported on 02/12/2017 09/27/16   Rai, Vernelle Emerald, MD  HYDROcodone-acetaminophen (NORCO/VICODIN) 5-325 MG tablet Take 2 tablets by mouth every 4  (four) hours as needed. Patient not taking: Reported on 02/12/2017 01/16/17   Fransico Meadow, PA-C  metroNIDAZOLE (FLAGYL) 500 MG tablet Take 1 tablet (500 mg total) by mouth 2 (two) times daily. Patient not taking: Reported on 01/23/2017 01/16/17   Sidney Ace    Family History Family History  Problem Relation Age of Onset  . Lung cancer Mother 63  . Heart attack Father        Vague  . Hypertension Brother   . Esophageal cancer Brother   . Diabetes Paternal Grandmother        questionable  . Colon cancer Neg Hx   . Kidney disease Neg Hx   . Liver disease Neg Hx     Social History Social History  Substance Use Topics  . Smoking status: Current Some Day Smoker  Years: 34.00    Types: Cigarettes  . Smokeless tobacco: Never Used     Comment: 04/25/2016 "2 cigarettes/wik sometimes; sometimes nothing for months; real smoker til 2014"  . Alcohol use No     Comment: Denies 08/24/16     Allergies   Penicillins   Review of Systems Review of Systems All other systems reviewed and are negative except that which was mentioned in HPI   Physical Exam Updated Vital Signs BP 108/60 (BP Location: Right Arm)   Pulse 63   Temp 98.7 F (37.1 C) (Oral)   Resp 14   Ht 5\' 5"  (1.651 m)   Wt 56.7 kg (125 lb)   SpO2 99%   BMI 20.80 kg/m   Physical Exam  Constitutional: She is oriented to person, place, and time. She appears well-developed and well-nourished. No distress.  HENT:  Head: Normocephalic and atraumatic.  Mouth/Throat: Oropharynx is clear and moist.  Moist mucous membranes  Eyes: Conjunctivae are normal.  Neck: Neck supple.  Cardiovascular: Normal rate, regular rhythm and normal heart sounds.   No murmur heard. Pulmonary/Chest: Effort normal and breath sounds normal.  Abdominal: Soft. Bowel sounds are normal. She exhibits no distension. There is no tenderness.  Musculoskeletal: She exhibits no edema.  Neurological: She is alert and oriented to person,  place, and time.  Fluent speech  Skin: Skin is warm and dry.  Psychiatric: Judgment normal.  Flat affect  Nursing note and vitals reviewed.    ED Treatments / Results  Labs (all labs ordered are listed, but only abnormal results are displayed) Labs Reviewed  COMPREHENSIVE METABOLIC PANEL - Abnormal; Notable for the following:       Result Value   Glucose, Bld 100 (*)    All other components within normal limits  LIPASE, BLOOD  CBC  URINALYSIS, ROUTINE W REFLEX MICROSCOPIC    EKG  EKG Interpretation None       Radiology No results found.  Procedures Procedures (including critical care time)  Medications Ordered in ED Medications  ondansetron (ZOFRAN-ODT) disintegrating tablet 4 mg (4 mg Oral Given 02/12/17 1751)  oxyCODONE-acetaminophen (PERCOCET/ROXICET) 5-325 MG per tablet 1 tablet (1 tablet Oral Given 02/12/17 1751)     Initial Impression / Assessment and Plan / ED Course  I have reviewed the triage vital signs and the nursing notes.  Pertinent labs & imaging results that were available during my care of the patient were reviewed by me and considered in my medical decision making (see chart for details).    PT w/ h/o anxiety/depression, stopped Prozac 1 month ago, continues to be on wellbutrin. She also reports nausea and decreased appetite. She was comfortable on exam w/ normal VS. Abd soft and non-tender. She denied any severe depression, interference w/ daily functioning, SI, or HI therefore I have recommended close PCP f/u to discuss medications and therapy.   Regarding nausea, gave zofran and obtained above labs given report of not eating for 4 days. Gave home dose of percocet for back pain. Her lab work was completely normal with no evidence of dehydration, normal creatinine, normal LFTs and lipase. Patient has been able to drink orange juice here with no problems. She states she already has Zofran to use as needed at home. I reviewed return precautions and  patient discharged in satisfactory condition.  Final Clinical Impressions(s) / ED Diagnoses   Final diagnoses:  Anxiety  Nausea  Other chronic pain    New Prescriptions New Prescriptions   No medications on  file     Little, Wenda Overland, MD 02/12/17 2022

## 2017-02-12 NOTE — ED Notes (Signed)
Patient given sandwich and cup of orange juice for PO challenge; patient had half cup of orange juice and refused sandwich. MD and RN aware.

## 2017-02-13 ENCOUNTER — Other Ambulatory Visit: Payer: Self-pay

## 2017-02-13 NOTE — Patient Outreach (Signed)
[facility]) Care Management  02/13/2017  Dorothy White [birth year] [id number]   Telephone call to patient for ED Utilization screen.  No answer.  HIPAA compliant voice message left.  Plan: RN Health Coach will attempt patient again in the next 10 business days.  Jone Baseman, RN, MSN [place] [phone]

## 2017-02-16 ENCOUNTER — Other Ambulatory Visit: Payer: Self-pay

## 2017-02-16 NOTE — Patient Outreach (Signed)
[facility]) Care Management  02/16/2017  Dorothy White May 26, 1951 [id number]   2nd telephone call to patient for ED Utilization referral.  Female answered stating patient not in. Advised would attempt patient another time.  Plan: RN Health Coach will attempt patient again in the next 10 business days.  Jone Baseman, RN, MSN [place] [phone]

## 2017-02-17 ENCOUNTER — Emergency Department (HOSPITAL_COMMUNITY): Payer: PPO

## 2017-02-17 ENCOUNTER — Encounter (HOSPITAL_COMMUNITY): Payer: Self-pay | Admitting: *Deleted

## 2017-02-17 ENCOUNTER — Observation Stay (HOSPITAL_COMMUNITY)
Admission: EM | Admit: 2017-02-17 | Discharge: 2017-02-19 | Disposition: A | Discharge status: Home / Self Care | Payer: PPO | Attending: Internal Medicine | Admitting: Internal Medicine

## 2017-02-17 ENCOUNTER — Observation Stay (HOSPITAL_COMMUNITY): Payer: PPO

## 2017-02-17 DIAGNOSIS — F419 Anxiety disorder, unspecified: Secondary | ICD-10-CM | POA: Diagnosis present

## 2017-02-17 DIAGNOSIS — I6523 Occlusion and stenosis of bilateral carotid arteries: Secondary | ICD-10-CM | POA: Insufficient documentation

## 2017-02-17 DIAGNOSIS — I10 Essential (primary) hypertension: Secondary | ICD-10-CM | POA: Diagnosis not present

## 2017-02-17 DIAGNOSIS — W19XXXA Unspecified fall, initial encounter: Secondary | ICD-10-CM | POA: Diagnosis not present

## 2017-02-17 DIAGNOSIS — R42 Dizziness and giddiness: Principal | ICD-10-CM | POA: Insufficient documentation

## 2017-02-17 DIAGNOSIS — Z79899 Other long term (current) drug therapy: Secondary | ICD-10-CM | POA: Insufficient documentation

## 2017-02-17 DIAGNOSIS — S70311A Abrasion, right thigh, initial encounter: Secondary | ICD-10-CM | POA: Diagnosis not present

## 2017-02-17 DIAGNOSIS — K219 Gastro-esophageal reflux disease without esophagitis: Secondary | ICD-10-CM | POA: Insufficient documentation

## 2017-02-17 DIAGNOSIS — Z87891 Personal history of nicotine dependence: Secondary | ICD-10-CM | POA: Insufficient documentation

## 2017-02-17 DIAGNOSIS — Z6820 Body mass index (BMI) 20.0-20.9, adult: Secondary | ICD-10-CM | POA: Diagnosis not present

## 2017-02-17 DIAGNOSIS — I493 Ventricular premature depolarization: Secondary | ICD-10-CM | POA: Insufficient documentation

## 2017-02-17 DIAGNOSIS — Z9882 Breast implant status: Secondary | ICD-10-CM | POA: Insufficient documentation

## 2017-02-17 DIAGNOSIS — E876 Hypokalemia: Secondary | ICD-10-CM | POA: Insufficient documentation

## 2017-02-17 DIAGNOSIS — E785 Hyperlipidemia, unspecified: Secondary | ICD-10-CM | POA: Diagnosis not present

## 2017-02-17 DIAGNOSIS — F418 Other specified anxiety disorders: Secondary | ICD-10-CM | POA: Insufficient documentation

## 2017-02-17 DIAGNOSIS — G894 Chronic pain syndrome: Secondary | ICD-10-CM | POA: Insufficient documentation

## 2017-02-17 DIAGNOSIS — F32A Depression, unspecified: Secondary | ICD-10-CM | POA: Diagnosis present

## 2017-02-17 DIAGNOSIS — G43009 Migraine without aura, not intractable, without status migrainosus: Secondary | ICD-10-CM | POA: Insufficient documentation

## 2017-02-17 DIAGNOSIS — K5732 Diverticulitis of large intestine without perforation or abscess without bleeding: Secondary | ICD-10-CM | POA: Insufficient documentation

## 2017-02-17 DIAGNOSIS — G459 Transient cerebral ischemic attack, unspecified: Secondary | ICD-10-CM | POA: Diagnosis not present

## 2017-02-17 DIAGNOSIS — S299XXA Unspecified injury of thorax, initial encounter: Secondary | ICD-10-CM | POA: Diagnosis not present

## 2017-02-17 DIAGNOSIS — Z90710 Acquired absence of both cervix and uterus: Secondary | ICD-10-CM | POA: Insufficient documentation

## 2017-02-17 DIAGNOSIS — E46 Unspecified protein-calorie malnutrition: Secondary | ICD-10-CM | POA: Insufficient documentation

## 2017-02-17 DIAGNOSIS — R51 Headache: Secondary | ICD-10-CM | POA: Diagnosis not present

## 2017-02-17 DIAGNOSIS — I25119 Atherosclerotic heart disease of native coronary artery with unspecified angina pectoris: Secondary | ICD-10-CM | POA: Diagnosis not present

## 2017-02-17 DIAGNOSIS — Z90722 Acquired absence of ovaries, bilateral: Secondary | ICD-10-CM | POA: Insufficient documentation

## 2017-02-17 DIAGNOSIS — Z7982 Long term (current) use of aspirin: Secondary | ICD-10-CM | POA: Insufficient documentation

## 2017-02-17 DIAGNOSIS — I428 Other cardiomyopathies: Secondary | ICD-10-CM | POA: Insufficient documentation

## 2017-02-17 DIAGNOSIS — S0990XA Unspecified injury of head, initial encounter: Secondary | ICD-10-CM | POA: Insufficient documentation

## 2017-02-17 DIAGNOSIS — F329 Major depressive disorder, single episode, unspecified: Secondary | ICD-10-CM | POA: Insufficient documentation

## 2017-02-17 DIAGNOSIS — S199XXA Unspecified injury of neck, initial encounter: Secondary | ICD-10-CM | POA: Diagnosis not present

## 2017-02-17 DIAGNOSIS — G629 Polyneuropathy, unspecified: Secondary | ICD-10-CM | POA: Insufficient documentation

## 2017-02-17 DIAGNOSIS — R296 Repeated falls: Secondary | ICD-10-CM | POA: Insufficient documentation

## 2017-02-17 DIAGNOSIS — Z833 Family history of diabetes mellitus: Secondary | ICD-10-CM | POA: Insufficient documentation

## 2017-02-17 DIAGNOSIS — Z8673 Personal history of transient ischemic attack (TIA), and cerebral infarction without residual deficits: Secondary | ICD-10-CM

## 2017-02-17 DIAGNOSIS — Z801 Family history of malignant neoplasm of trachea, bronchus and lung: Secondary | ICD-10-CM | POA: Insufficient documentation

## 2017-02-17 DIAGNOSIS — I6522 Occlusion and stenosis of left carotid artery: Secondary | ICD-10-CM

## 2017-02-17 DIAGNOSIS — E440 Moderate protein-calorie malnutrition: Secondary | ICD-10-CM | POA: Insufficient documentation

## 2017-02-17 DIAGNOSIS — M542 Cervicalgia: Secondary | ICD-10-CM | POA: Diagnosis not present

## 2017-02-17 DIAGNOSIS — R9431 Abnormal electrocardiogram [ECG] [EKG]: Secondary | ICD-10-CM | POA: Diagnosis not present

## 2017-02-17 DIAGNOSIS — M8580 Other specified disorders of bone density and structure, unspecified site: Secondary | ICD-10-CM | POA: Insufficient documentation

## 2017-02-17 DIAGNOSIS — Z8249 Family history of ischemic heart disease and other diseases of the circulatory system: Secondary | ICD-10-CM | POA: Insufficient documentation

## 2017-02-17 DIAGNOSIS — I739 Peripheral vascular disease, unspecified: Secondary | ICD-10-CM | POA: Diagnosis not present

## 2017-02-17 DIAGNOSIS — Z800 Family history of malignant neoplasm of digestive organs: Secondary | ICD-10-CM | POA: Insufficient documentation

## 2017-02-17 DIAGNOSIS — J309 Allergic rhinitis, unspecified: Secondary | ICD-10-CM | POA: Insufficient documentation

## 2017-02-17 DIAGNOSIS — Z85828 Personal history of other malignant neoplasm of skin: Secondary | ICD-10-CM | POA: Insufficient documentation

## 2017-02-17 DIAGNOSIS — M4802 Spinal stenosis, cervical region: Secondary | ICD-10-CM | POA: Insufficient documentation

## 2017-02-17 DIAGNOSIS — G43909 Migraine, unspecified, not intractable, without status migrainosus: Secondary | ICD-10-CM | POA: Diagnosis present

## 2017-02-17 DIAGNOSIS — R278 Other lack of coordination: Secondary | ICD-10-CM | POA: Diagnosis not present

## 2017-02-17 DIAGNOSIS — I959 Hypotension, unspecified: Secondary | ICD-10-CM | POA: Diagnosis not present

## 2017-02-17 LAB — BASIC METABOLIC PANEL
Anion gap: 8 (ref 5–15)
BUN: 13 mg/dL (ref 6–20)
CO2: 23 mmol/L (ref 22–32)
CREATININE: 0.88 mg/dL (ref 0.44–1.00)
Calcium: 9.6 mg/dL (ref 8.9–10.3)
Chloride: 105 mmol/L (ref 101–111)
Glucose, Bld: 95 mg/dL (ref 65–99)
Potassium: 4.2 mmol/L (ref 3.5–5.1)
SODIUM: 136 mmol/L (ref 135–145)

## 2017-02-17 LAB — RAPID URINE DRUG SCREEN, HOSP PERFORMED
Amphetamines: NOT DETECTED
BENZODIAZEPINES: POSITIVE — AB
Barbiturates: NOT DETECTED
Cocaine: NOT DETECTED
Opiates: NOT DETECTED
Tetrahydrocannabinol: NOT DETECTED

## 2017-02-17 LAB — DIFFERENTIAL
Basophils Absolute: 0 10*3/uL (ref 0.0–0.1)
Basophils Relative: 1 %
Eosinophils Absolute: 0.2 10*3/uL (ref 0.0–0.7)
Eosinophils Relative: 4 %
LYMPHS PCT: 32 %
Lymphs Abs: 1.8 10*3/uL (ref 0.7–4.0)
Monocytes Absolute: 0.4 10*3/uL (ref 0.1–1.0)
Monocytes Relative: 7 %
NEUTROS ABS: 3.3 10*3/uL (ref 1.7–7.7)
NEUTROS PCT: 56 %

## 2017-02-17 LAB — URINALYSIS, ROUTINE W REFLEX MICROSCOPIC
Bacteria, UA: NONE SEEN
Bilirubin Urine: NEGATIVE
GLUCOSE, UA: NEGATIVE mg/dL
HGB URINE DIPSTICK: NEGATIVE
Ketones, ur: NEGATIVE mg/dL
LEUKOCYTES UA: NEGATIVE
NITRITE: NEGATIVE
PH: 5 (ref 5.0–8.0)
Protein, ur: 30 mg/dL — AB
SPECIFIC GRAVITY, URINE: 1.02 (ref 1.005–1.030)
Squamous Epithelial / LPF: NONE SEEN

## 2017-02-17 LAB — ETHANOL

## 2017-02-17 LAB — PROTIME-INR
INR: 1.02
Prothrombin Time: 13.4 seconds (ref 11.4–15.2)

## 2017-02-17 LAB — I-STAT TROPONIN, ED: TROPONIN I, POC: 0 ng/mL (ref 0.00–0.08)

## 2017-02-17 LAB — APTT: APTT: 24 s (ref 24–36)

## 2017-02-17 LAB — CBC
HCT: 43.1 % (ref 36.0–46.0)
Hemoglobin: 13.8 g/dL (ref 12.0–15.0)
MCH: 27.9 pg (ref 26.0–34.0)
MCHC: 32 g/dL (ref 30.0–36.0)
MCV: 87.2 fL (ref 78.0–100.0)
PLATELETS: 272 10*3/uL (ref 150–400)
RBC: 4.94 MIL/uL (ref 3.87–5.11)
RDW: 14.3 % (ref 11.5–15.5)
WBC: 6.7 10*3/uL (ref 4.0–10.5)

## 2017-02-17 MED ORDER — OXYCODONE-ACETAMINOPHEN 5-325 MG PO TABS
1.0000 | ORAL_TABLET | Freq: Four times a day (QID) | ORAL | Status: DC | PRN
  Administered 2017-02-17 – 2017-02-19 (×6): 1 via ORAL
  Filled 2017-02-17 (×6): qty 1

## 2017-02-17 MED ORDER — ASPIRIN 300 MG RE SUPP: 300.0000 mg | Freq: Every day | RECTAL | Status: DC

## 2017-02-17 MED ORDER — OXYCODONE-ACETAMINOPHEN 10-325 MG PO TABS: 1.0000 | ORAL_TABLET | Freq: Four times a day (QID) | ORAL | Status: DC | PRN

## 2017-02-17 MED ORDER — ACETAMINOPHEN 160 MG/5ML PO SOLN: 650.0000 mg | ORAL | Status: DC | PRN

## 2017-02-17 MED ORDER — MORPHINE SULFATE (PF) 4 MG/ML IV SOLN
2.0000 mg | Freq: Once | INTRAVENOUS | Status: AC
  Administered 2017-02-17: 2 mg via INTRAVENOUS
  Filled 2017-02-17: qty 1

## 2017-02-17 MED ORDER — TRAZODONE HCL 100 MG PO TABS
200.0000 mg | ORAL_TABLET | Freq: Every evening | ORAL | Status: DC | PRN
  Administered 2017-02-19: 200 mg via ORAL
  Filled 2017-02-17 (×2): qty 2

## 2017-02-17 MED ORDER — ASPIRIN 325 MG PO TABS
325.0000 mg | ORAL_TABLET | Freq: Every day | ORAL | Status: DC
  Administered 2017-02-17 – 2017-02-19 (×3): 325 mg via ORAL
  Filled 2017-02-17 (×3): qty 1

## 2017-02-17 MED ORDER — MORPHINE SULFATE (PF) 4 MG/ML IV SOLN
4.0000 mg | Freq: Once | INTRAVENOUS | Status: AC
  Administered 2017-02-17: 4 mg via INTRAVENOUS
  Filled 2017-02-17: qty 1

## 2017-02-17 MED ORDER — SENNOSIDES-DOCUSATE SODIUM 8.6-50 MG PO TABS: 1.0000 | ORAL_TABLET | Freq: Every evening | ORAL | Status: DC | PRN

## 2017-02-17 MED ORDER — ACETAMINOPHEN 325 MG PO TABS
650.0000 mg | ORAL_TABLET | ORAL | Status: DC | PRN
  Administered 2017-02-18: 650 mg via ORAL
  Filled 2017-02-17: qty 2

## 2017-02-17 MED ORDER — SODIUM CHLORIDE 0.9 % IV SOLN
INTRAVENOUS | Status: AC
  Administered 2017-02-18: 02:00:00 via INTRAVENOUS

## 2017-02-17 MED ORDER — STROKE: EARLY STAGES OF RECOVERY BOOK
Freq: Once | Status: AC
  Administered 2017-02-17: 1
  Filled 2017-02-17: qty 1

## 2017-02-17 MED ORDER — ENOXAPARIN SODIUM 40 MG/0.4ML ~~LOC~~ SOLN
40.0000 mg | SUBCUTANEOUS | Status: DC
  Administered 2017-02-18: 40 mg via SUBCUTANEOUS
  Filled 2017-02-17: qty 0.4

## 2017-02-17 MED ORDER — FLUOXETINE HCL 20 MG PO CAPS
40.0000 mg | ORAL_CAPSULE | Freq: Every day | ORAL | Status: DC
  Administered 2017-02-18 – 2017-02-19 (×2): 40 mg via ORAL
  Filled 2017-02-17 (×2): qty 2

## 2017-02-17 MED ORDER — OXYCODONE HCL 5 MG PO TABS
5.0000 mg | ORAL_TABLET | Freq: Four times a day (QID) | ORAL | Status: DC | PRN
  Administered 2017-02-17 – 2017-02-19 (×6): 5 mg via ORAL
  Filled 2017-02-17 (×6): qty 1

## 2017-02-17 MED ORDER — ACETAMINOPHEN 650 MG RE SUPP: 650.0000 mg | RECTAL | Status: DC | PRN

## 2017-02-17 NOTE — ED Notes (Signed)
Liu, Md aware of pt presentation, pts protocol orders completed will eval further once seen by MD in room

## 2017-02-17 NOTE — ED Triage Notes (Signed)
Pt reports falling x 4 times today, denies LOC, pt c/o dizziness at 8:30 today, denies facial droop, no slurred speech, MAE, pt reports worse dizziness with position changes, A&O x4

## 2017-02-17 NOTE — ED Notes (Signed)
Attempted report x1. 

## 2017-02-17 NOTE — Progress Notes (Signed)
Patient off unit for CXR.

## 2017-02-17 NOTE — ED Provider Notes (Signed)
[facility] DEPT Provider Note   CSN: [id number] Arrival date & time: 02/17/17  1135     History   Chief Complaint Chief Complaint  Patient presents with  . Fall    HPI Dorothy White is a 66 y.o. female.  HPI Patient reports she's fallen 4 times since since awakening this morning. At one point she was weak in her legs and too weak to stand up. She suffered an abrasion to her right thighs result falls. She also complains of headache and neck pain since the event. She gets similar she gets headaches several times per month. She is not using her cane at the time of falls. Typically walks with cane Past Medical History:  Diagnosis Date  . Anemia    ?  Marland Kitchen Anxiety   . Arthritis    "everywhere" (04/25/2016)  . 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 Chronic back pain    "all over" (04/25/2016)  . Constipation   . Depression   . Diverticulitis   . GERD (gastroesophageal reflux disease)   . Headache    "weekly" (04/25/2016)  . HLD (hyperlipidemia)    hx (04/25/2016)  . 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 Mild CAD    a. 25% mLAD, otherwise no sig disease 01/2016 cath.  Marland Kitchen NICM (nonischemic cardiomyopathy) ([place])    a. EF 40-45% by echo 08/3298 at time of complicated migraine/neuro sx, 55-65% at time of cath 01/2016  . Stroke [facility]) 06/2013   "mini" stroke , ?possibly hemaplegic migraine per pt    Patient Active Problem List   Diagnosis Date Noted  . Chest pain 09/26/2016  . Diverticulitis of sigmoid colon 04/25/2016  . Infection due to ESBL-producing Escherichia coli/Diverticular Abscess 03/15/2016  . Diverticulitis 03/12/2016  . Chronic pain syndrome 03/12/2016  . Lesion of pancreas 03/12/2016  . History of chest pain 03/12/2016  . Hypokalemia 03/12/2016  . Angina, class IV ([place]) - chest tightness and pressure with dyspnea with minimal exertion 02/17/2016    Class: Question of  . TIA (transient ischemic  attack) 12/26/2015  . DOE (dyspnea on exertion) 12/26/2015  . Right sided weakness 12/17/2015  . Ataxia 12/17/2015  . Shoulder fracture 02/15/2015  . Chronic headache 10/11/2013  . Weakness generalized 08/12/2013  . Dizziness and giddiness 08/12/2013  . Abnormality of gait 07/11/2013  . Hyperlipidemia 05/07/2007  . COMMON MIGRAINE 05/07/2007  . PREMATURE VENTRICULAR CONTRACTIONS, FREQUENT 05/07/2007  . GERD 05/07/2007  . DIVERTICULOSIS, COLON 05/07/2007  . 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 OSTEOPENIA 05/07/2007  . Anxiety and depression 03/24/2007  . HEMORRHOIDS 03/24/2007  . ALLERGIC RHINITIS 03/24/2007  . LOW BACK PAIN 03/24/2007  . MIGRAINES, HX OF 03/24/2007    Past Surgical History:  Procedure Laterality Date  . AUGMENTATION MAMMAPLASTY  1980  . BASAL CELL CARCINOMA EXCISION     "tip of my nose"  . BUNIONECTOMY Bilateral 10/2003  . CARDIAC CATHETERIZATION N/A 02/20/2016   Procedure: Left Heart Cath and Coronary Angiography;  Surgeon: Jettie Booze, MD;  Location: [facility];  Service: Cardiovascular;  Laterality: N/A;  . COLONOSCOPY    . DILATION AND CURETTAGE OF UTERUS    . FRACTURE SURGERY    . IR GENERIC HISTORICAL  03/18/2016   IR SINUS/FIST TUBE CHK-NON GI 03/18/2016 Aletta Edouard, MD [facility]-INTERV RAD  . LAPAROSCOPIC SIGMOID COLECTOMY N/A 04/25/2016   Procedure: LAPAROSCOPIC SIGMOID COLECTOMY;  Surgeon: Stark Klein, MD;  Location: [facility] OR;  Service: General;  Laterality: N/A;  . OOPHORECTOMY Bilateral ~ 1999  . ORIF HUMERUS FRACTURE Right 02/15/2015   Procedure: OPEN REDUCTION INTERNAL FIXATION (ORIF) PROXIMAL HUMERUS FRACTURE;  Surgeon: Netta Cedars, MD;  Location: [facility];  Service: Orthopedics;  Laterality: Right;  . RHINOPLASTY  1976  . TONSILLECTOMY    . TUBAL LIGATION  ~ 1983  . VAGINAL HYSTERECTOMY  ~ 1998    OB History    No data available       Home Medications    Prior to Admission medications   Medication Sig Start Date End Date  Taking? Authorizing Provider  buPROPion (WELLBUTRIN XL) 150 MG 24 hr tablet Take 150 mg by mouth daily. 01/16/17   [provider]  clonazePAM (KLONOPIN) 1 MG tablet Take 1 tablet (1 mg total) by mouth daily as needed (anxiety/sleep.). 02/15/16   Dorothy Spark, MD  FLUoxetine (PROZAC) 20 MG capsule Take 2 capsules (40 mg total) by mouth daily. Patient not taking: Reported on 02/12/2017 09/27/16   Rai, Vernelle Emerald, MD  HYDROcodone-acetaminophen (NORCO/VICODIN) 5-325 MG tablet Take 2 tablets by mouth every 4 (four) hours as needed. Patient not taking: Reported on 02/12/2017 01/16/17   Fransico Meadow, PA-C  levocetirizine (XYZAL) 5 MG tablet Take 5 mg by mouth every evening.    [provider]  LUTEIN PO Take 1 capsule by mouth every evening.    [provider]  metroNIDAZOLE (FLAGYL) 500 MG tablet Take 1 tablet (500 mg total) by mouth 2 (two) times daily. Patient not taking: Reported on 01/23/2017 01/16/17   Fransico Meadow, PA-C  mometasone (NASONEX) 50 MCG/ACT nasal spray Place 2 sprays into the nose daily.    [provider]  ondansetron (ZOFRAN) 8 MG tablet Take 8 mg by mouth every 8 (eight) hours as needed for nausea or vomiting.    [provider]  oxyCODONE-acetaminophen (PERCOCET) 10-325 MG tablet Take 1 tablet by mouth every 6 (six) hours as needed for pain.    [provider]  RELPAX 40 MG tablet Take 1 tablet by mouth as needed for migraine and may take 1 additional tablet in 2 hours if no relief 01/16/16   [provider]  traZODone (DESYREL) 100 MG tablet Take 200 mg by mouth at bedtime as needed for sleep. 11/12/16   [provider]  vitamin E 100 UNIT capsule Take 100 Units by mouth every evening.    [provider]    Family History Family History  Problem Relation Age of Onset  . Lung cancer Mother 23  . Heart attack Father        Vague  . Hypertension Brother   . Esophageal cancer Brother   .  Diabetes Paternal Grandmother        questionable  . Colon cancer Neg Hx   . Kidney disease Neg Hx   . Liver disease Neg Hx     Social History Social History  Substance Use Topics  . Smoking status: Former Smoker    Years: 34.00    Types: Cigarettes    Quit date: 02/03/2017  . Smokeless tobacco: Never Used     Comment: 04/25/2016 "2 cigarettes/wik sometimes; sometimes nothing for months; real smoker til 2014"  . Alcohol use No     Comment: Denies 08/24/16     Allergies   Penicillins   Review of Systems Review of Systems  Musculoskeletal: Positive for gait problem and neck pain.  Walks with cane  Skin: Positive for wound.       Abrasion right thigh  Neurological: Positive for headaches.  All other systems reviewed and are negative.    Physical Exam Updated Vital Signs BP 119/63 (BP Location: Left Arm)   Pulse 61   Temp 98 F (36.7 C) (Oral)   Resp (!) 7   Ht 5\' 5"  (1.651 m)   Wt 54.4 kg (120 lb)   SpO2 98%   BMI 19.97 kg/m   Physical Exam  Constitutional: She is oriented to person, place, and time.  Frail, chronically ill  HENT:  Head: Normocephalic and atraumatic.  Eyes: Pupils are equal, round, and reactive to light. Conjunctivae are normal.  Neck: Neck supple. No tracheal deviation present. No thyromegaly present.  Cardiovascular: Normal rate and regular rhythm.   No murmur heard. Pulmonary/Chest: Effort normal and breath sounds normal.  Abdominal: Soft. Bowel sounds are normal. She exhibits no distension. There is no tenderness.  Musculoskeletal: Normal range of motion. She exhibits no edema or tenderness.  Neurological: She is alert and oriented to person, place, and time. She displays normal reflexes. Coordination normal.  Walks without assistance, gait slightly unsteady. Motor strength 5 over 5 overall DTRs symmetric bilaterally at knee jerk ankle jerk and biceps was ordered bilaterally finger-nose normal Romberg normal pronator drift normal.  Cranial nerves II through XII grossly intact  Skin: Skin is warm and dry. No rash noted.  Prostate 10 cm diameter abrasion over right lateral thigh. All other extremity is a contusion abrasion or tenderness neurovascular intact  Psychiatric: She has a normal mood and affect.  Nursing note and vitals reviewed.    ED Treatments / Results  Labs (all labs ordered are listed, but only abnormal results are displayed) Labs Reviewed  BASIC METABOLIC PANEL  CBC  URINALYSIS, ROUTINE W REFLEX MICROSCOPIC  ETHANOL  PROTIME-INR  APTT  DIFFERENTIAL  RAPID URINE DRUG SCREEN, HOSP PERFORMED  I-STAT CHEM 8, ED  I-STAT TROPONIN, ED    EKG  EKG Interpretation  Date/Time:  Tuesday February 17 2017 11:40:57 EDT Ventricular Rate:  75 PR Interval:  116 QRS Duration: 88 QT Interval:  398 QTC Calculation: 444 R Axis:   75 Text Interpretation:  Normal sinus rhythm Nonspecific T wave abnormality Abnormal ECG No significant change since last tracing Confirmed by Orlie Dakin [phone]) on 02/17/2017 3:06:38 PM      Results for orders placed or performed during the hospital encounter of 60/45/40  Basic metabolic panel  Result Value Ref Range   Sodium 136 135 - 145 mmol/L   Potassium 4.2 3.5 - 5.1 mmol/L   Chloride 105 101 - 111 mmol/L   CO2 23 22 - 32 mmol/L   Glucose, Bld 95 65 - 99 mg/dL   BUN 13 6 - 20 mg/dL   Creatinine, Ser 0.88 0.44 - 1.00 mg/dL   Calcium 9.6 8.9 - 10.3 mg/dL   GFR calc non Af Amer >60 >60 mL/min   GFR calc Af Amer >60 >60 mL/min   Anion gap 8 5 - 15  CBC  Result Value Ref Range   WBC 6.7 4.0 - 10.5 K/uL   RBC 4.94 3.87 - 5.11 MIL/uL   Hemoglobin 13.8 12.0 - 15.0 g/dL   HCT 43.1 36.0 - 46.0 %   MCV 87.2 78.0 - 100.0 fL   MCH 27.9 26.0 - 34.0 pg   MCHC 32.0 30.0 - 36.0 g/dL   RDW 14.3 11.5 - 15.5 %  Platelets 272 150 - 400 K/uL  Urinalysis, Routine w reflex microscopic  Result Value Ref Range   Color, Urine YELLOW YELLOW   APPearance CLEAR CLEAR   Specific  Gravity, Urine 1.020 1.005 - 1.030   pH 5.0 5.0 - 8.0   Glucose, UA NEGATIVE NEGATIVE mg/dL   Hgb urine dipstick NEGATIVE NEGATIVE   Bilirubin Urine NEGATIVE NEGATIVE   Ketones, ur NEGATIVE NEGATIVE mg/dL   Protein, ur 30 (A) NEGATIVE mg/dL   Nitrite NEGATIVE NEGATIVE   Leukocytes, UA NEGATIVE NEGATIVE   RBC / HPF 0-5 0 - 5 RBC/hpf   WBC, UA 0-5 0 - 5 WBC/hpf   Bacteria, UA NONE SEEN NONE SEEN   Squamous Epithelial / LPF NONE SEEN NONE SEEN   Mucous PRESENT    Hyaline Casts, UA PRESENT   Ethanol  Result Value Ref Range   Alcohol, Ethyl (B) <5 <5 mg/dL  Protime-INR  Result Value Ref Range   Prothrombin Time 13.4 11.4 - 15.2 seconds   INR 1.02   APTT  Result Value Ref Range   aPTT 24 24 - 36 seconds  Differential  Result Value Ref Range   Neutrophils Relative % 56 %   Neutro Abs 3.3 1.7 - 7.7 K/uL   Lymphocytes Relative 32 %   Lymphs Abs 1.8 0.7 - 4.0 K/uL   Monocytes Relative 7 %   Monocytes Absolute 0.4 0.1 - 1.0 K/uL   Eosinophils Relative 4 %   Eosinophils Absolute 0.2 0.0 - 0.7 K/uL   Basophils Relative 1 %   Basophils Absolute 0.0 0.0 - 0.1 K/uL  Urine rapid drug screen (hosp performed)not at [facility]  Result Value Ref Range   Opiates NONE DETECTED NONE DETECTED   Cocaine NONE DETECTED NONE DETECTED   Benzodiazepines POSITIVE (A) NONE DETECTED   Amphetamines NONE DETECTED NONE DETECTED   Tetrahydrocannabinol NONE DETECTED NONE DETECTED   Barbiturates NONE DETECTED NONE DETECTED  I-stat troponin, ED (not at [facility], [facility])  Result Value Ref Range   Troponin i, poc 0.00 0.00 - 0.08 ng/mL   Comment 3           Dg Ribs Unilateral W/chest Left  Result Date: 01/23/2017 CLINICAL DATA:  Acute chest and left rib pain following fall yesterday. Initial encounter. EXAM: LEFT RIBS AND CHEST - 3+ VIEW COMPARISON:  12/25/2016 and prior exams FINDINGS: The cardiomediastinal silhouette is unremarkable. There is no evidence of focal airspace disease, pulmonary edema, suspicious pulmonary  nodule/mass, pleural effusion, or pneumothorax. No acute fracture or rib abnormality. Bilateral breast prosthesis identified. IMPRESSION: No acute abnormality.  No evidence of acute rib fracture. Electronically Signed   By: Margarette Canada M.D.   On: 01/23/2017 18:52   Ct Head Wo Contrast  Result Date: 02/17/2017 CLINICAL DATA:  Several falls today with headaches and neck pain, initial encounter EXAM: CT HEAD WITHOUT CONTRAST CT CERVICAL SPINE WITHOUT CONTRAST TECHNIQUE: Multidetector CT imaging of the head and cervical spine was performed following the standard protocol without intravenous contrast. Multiplanar CT image reconstructions of the cervical spine were also generated. COMPARISON:  01/23/2017 FINDINGS: CT HEAD FINDINGS Brain: Mild atrophic changes are seen. No findings to suggest acute hemorrhage, acute infarction or space-occupying mass lesion are noted. Vascular: No hyperdense vessel or unexpected calcification. Skull: Normal. Negative for fracture or focal lesion. Sinuses/Orbits: No acute finding. Other: None. CT CERVICAL SPINE FINDINGS Alignment: Within normal limits. Skull base and vertebrae: 7 cervical segments are well visualized. Osteophytic changes are noted most prominent at C6-7.  Mild facet hypertrophic changes are noted similar to that seen on the prior exam. No acute fracture or acute facet abnormality is noted. Soft tissues and spinal canal: Within normal limits. Disc levels: Disc space narrowing and osteophytic changes at C6-7 are noted with mild central canal stenosis. Upper chest: Visualized upper abdomen is within normal limits. Other: None IMPRESSION: CT of the head: No acute abnormality noted. Mild atrophic changes are seen. CT of the cervical spine: Mild degenerative change stable from the previous exam. No acute abnormality noted. Electronically Signed   By: Inez Catalina M.D.   On: 02/17/2017 17:09   Ct Head Wo Contrast  Result Date: 01/23/2017 CLINICAL DATA:  Blurry vision,  word-finding difficulty and leg weakness beginning at 2030 hours yesterday. Multiple falls and head injury, mild headache. History of migraines, stroke. EXAM: CT HEAD WITHOUT CONTRAST CT CERVICAL SPINE WITHOUT CONTRAST TECHNIQUE: Multidetector CT imaging of the head and cervical spine was performed following the standard protocol without intravenous contrast. Multiplanar CT image reconstructions of the cervical spine were also generated. COMPARISON:  MRI of the head Dec 17, 2016 and MRI of the cervical spine Dec 19, 2015 FINDINGS: CT HEAD FINDINGS BRAIN: No intraparenchymal hemorrhage, mass effect nor midline shift. The ventricles and sulci are normal for age. No acute large vascular territory infarcts. Beam hardening artifact through the pons most conspicuous on sagittal reformation. No abnormal extra-axial fluid collections. Basal cisterns are patent. VASCULAR: Trace calcific atherosclerosis of the carotid siphons. SKULL: No skull fracture. No significant scalp soft tissue swelling. SINUSES/ORBITS: The mastoid air-cells and included paranasal sinuses are well-aerated.The included ocular globes and orbital contents are non-suspicious. OTHER: None. CT CERVICAL SPINE FINDINGS ALIGNMENT: Maintained lordosis. Vertebral bodies in alignment. SKULL BASE AND VERTEBRAE: Cervical vertebral bodies and posterior elements are intact. Mild C6-7 disc height loss with mild ventral endplate spurring M4-2 and C6-7. Moderate RIGHT C2-3 and LEFT C7-T1 facet arthropathy. No destructive bony lesions. C1-2 articulation maintained. SOFT TISSUES AND SPINAL CANAL: Normal. DISC LEVELS: No significant osseous canal stenosis. Mild LEFT greater than RIGHT C6-7 neural foraminal narrowing. UPPER CHEST: Lung apices are clear. OTHER: None. IMPRESSION: CT HEAD: Negative for age. CT CERVICAL SPINE:  Negative for age. Electronically Signed   By: Elon Alas M.D.   On: 01/23/2017 17:23   Ct Cervical Spine Wo Contrast  Result Date:  02/17/2017 CLINICAL DATA:  Several falls today with headaches and neck pain, initial encounter EXAM: CT HEAD WITHOUT CONTRAST CT CERVICAL SPINE WITHOUT CONTRAST TECHNIQUE: Multidetector CT imaging of the head and cervical spine was performed following the standard protocol without intravenous contrast. Multiplanar CT image reconstructions of the cervical spine were also generated. COMPARISON:  01/23/2017 FINDINGS: CT HEAD FINDINGS Brain: Mild atrophic changes are seen. No findings to suggest acute hemorrhage, acute infarction or space-occupying mass lesion are noted. Vascular: No hyperdense vessel or unexpected calcification. Skull: Normal. Negative for fracture or focal lesion. Sinuses/Orbits: No acute finding. Other: None. CT CERVICAL SPINE FINDINGS Alignment: Within normal limits. Skull base and vertebrae: 7 cervical segments are well visualized. Osteophytic changes are noted most prominent at C6-7. Mild facet hypertrophic changes are noted similar to that seen on the prior exam. No acute fracture or acute facet abnormality is noted. Soft tissues and spinal canal: Within normal limits. Disc levels: Disc space narrowing and osteophytic changes at C6-7 are noted with mild central canal stenosis. Upper chest: Visualized upper abdomen is within normal limits. Other: None IMPRESSION: CT of the head: No acute abnormality noted. Mild  atrophic changes are seen. CT of the cervical spine: Mild degenerative change stable from the previous exam. No acute abnormality noted. Electronically Signed   By: Inez Catalina M.D.   On: 02/17/2017 17:09   Ct Cervical Spine Wo Contrast  Result Date: 01/23/2017 CLINICAL DATA:  Blurry vision, word-finding difficulty and leg weakness beginning at 2030 hours yesterday. Multiple falls and head injury, mild headache. History of migraines, stroke. EXAM: CT HEAD WITHOUT CONTRAST CT CERVICAL SPINE WITHOUT CONTRAST TECHNIQUE: Multidetector CT imaging of the head and cervical spine was performed  following the standard protocol without intravenous contrast. Multiplanar CT image reconstructions of the cervical spine were also generated. COMPARISON:  MRI of the head Dec 17, 2016 and MRI of the cervical spine Dec 19, 2015 FINDINGS: CT HEAD FINDINGS BRAIN: No intraparenchymal hemorrhage, mass effect nor midline shift. The ventricles and sulci are normal for age. No acute large vascular territory infarcts. Beam hardening artifact through the pons most conspicuous on sagittal reformation. No abnormal extra-axial fluid collections. Basal cisterns are patent. VASCULAR: Trace calcific atherosclerosis of the carotid siphons. SKULL: No skull fracture. No significant scalp soft tissue swelling. SINUSES/ORBITS: The mastoid air-cells and included paranasal sinuses are well-aerated.The included ocular globes and orbital contents are non-suspicious. OTHER: None. CT CERVICAL SPINE FINDINGS ALIGNMENT: Maintained lordosis. Vertebral bodies in alignment. SKULL BASE AND VERTEBRAE: Cervical vertebral bodies and posterior elements are intact. Mild C6-7 disc height loss with mild ventral endplate spurring V6-9 and C6-7. Moderate RIGHT C2-3 and LEFT C7-T1 facet arthropathy. No destructive bony lesions. C1-2 articulation maintained. SOFT TISSUES AND SPINAL CANAL: Normal. DISC LEVELS: No significant osseous canal stenosis. Mild LEFT greater than RIGHT C6-7 neural foraminal narrowing. UPPER CHEST: Lung apices are clear. OTHER: None. IMPRESSION: CT HEAD: Negative for age. CT CERVICAL SPINE:  Negative for age. Electronically Signed   By: Elon Alas M.D.   On: 01/23/2017 17:23    Radiology No results found.  Procedures Procedures (including critical care time)  Medications Ordered in ED Medications - No data to display   Initial Impression / Assessment and Plan / ED Course  I have reviewed the triage vital signs and the nursing notes.  Pertinent labs & imaging results that were available during my care of the  patient were reviewed by me and considered in my medical decision making (see chart for details).     5:40 PM she reports no relief in pain after treatment with intravenous morphine. She complains of headache and neck pain. Additional morphine 4 no grams IV ordered by me. Concern for TIA leading to multiple falls. Patient will need further diagnostic evaluation. She is a fall risk and may require physical therapy and home health needs addressed. Hospitalist service consulted who will arrange for overnight stay Final Clinical Impressions(s) / ED Diagnoses  Diagnoses #1 multiple falls at home #2 headache #3 neck pain #4 abrasion to right thumb Final diagnoses:  None    New Prescriptions New Prescriptions   No medications on file     Orlie Dakin, MD 02/17/17 [phone]

## 2017-02-17 NOTE — ED Notes (Signed)
Patient transported to MRI 

## 2017-02-17 NOTE — H&P (Signed)
History and Physical    Dorothy White OFB:[id number] DOB: [birth year] DOA: 02/17/2017  PCP: Maurice Small, MD  Patient coming from:home  Chief Complaint:falls  HPI: Dorothy White is a 66 y.o. female with medical history significant of anxiety depression, chronic migraine, dyslipidemia, prior history of TIA in 2013, smoker and reported quitting about 3 weeks ago, mild LVH, presented with multiple episodes of falls today. Patient reported that she felt dizzy associated with weakness in her legs and had a fall. It happened about 4 times today. She hit her head once. She denied losing consciousness. Reported headache occasional intermittent nausea. Denied vomiting or abdominal pain, chest pain or shortness of breath. No dysuria urgency frequency. No urinary or bowel incontinence. ED Course: CT scan of head and cervical spine unremarkable. Labs unremarkable. Admitted for TIA workup.  Review of Systems: As per HPI otherwise 10 point review of systems negative.    Past Medical History:  Diagnosis Date  . Anemia    ?  Marland Kitchen Anxiety   . Arthritis    "everywhere" (04/25/2016)  . 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 Chronic back pain    "all over" (04/25/2016)  . Constipation   . Depression   . Diverticulitis   . GERD (gastroesophageal reflux disease)   . Headache    "weekly" (04/25/2016)  . HLD (hyperlipidemia)    hx (04/25/2016)  . 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 Mild CAD    a. 25% mLAD, otherwise no sig disease 01/2016 cath.  Marland Kitchen NICM (nonischemic cardiomyopathy) ([place])    a. EF 40-45% by echo 01/8241 at time of complicated migraine/neuro sx, 55-65% at time of cath 01/2016  . Stroke [facility]) 06/2013   "mini" stroke , ?possibly hemaplegic migraine per pt    Past Surgical History:  Procedure Laterality Date  . AUGMENTATION MAMMAPLASTY  1980  . BASAL CELL CARCINOMA EXCISION     "tip of my nose"  . BUNIONECTOMY  Bilateral 10/2003  . CARDIAC CATHETERIZATION N/A 02/20/2016   Procedure: Left Heart Cath and Coronary Angiography;  Surgeon: Jettie Booze, MD;  Location: [place] [facility];  Service: Cardiovascular;  Laterality: N/A;  . COLONOSCOPY    . DILATION AND CURETTAGE OF UTERUS    . FRACTURE SURGERY    . IR GENERIC HISTORICAL  03/18/2016   IR SINUS/FIST TUBE CHK-NON GI 03/18/2016 Aletta Edouard, MD [facility]INTER[facility]  . LAPAROSCOPIC SIGMOID COLECTOMY N/A 04/25/2016   Procedure: LAPAROSCOPIC SIGMOID COLECTOMY;  Surgeon: Stark Klein, MD;  Location: [place];  Service: General;  Laterality: N/A;  . OOPHORECTOMY Bilateral ~ 1999  . ORIF HUMERUS FRACTURE Right 02/15/2015   Procedure: OPEN REDUCTION INTERNAL FIXATION (ORIF) PROXIMAL HUMERUS FRACTURE;  Surgeon: Netta Cedars, MD;  Location: [place];  Service: Orthopedics;  Laterality: Right;  . RHINOPLASTY  1976  . TONSILLECTOMY    . TUBAL LIGATION  ~ 1983  . VAGINAL HYSTERECTOMY  ~ 1998    Social history: reports that she quit smoking about 2 weeks ago. Her smoking use included Cigarettes. She quit after 34.00 years of use. She has never used smokeless tobacco. She reports that she does not drink alcohol or use drugs.  Allergies  Allergen Reactions  . Penicillins Hives    Has patient had a PCN reaction causing immediate rash, facial/tongue/throat swelling, SOB or lightheadedness with hypotension: Yes Has patient had a PCN reaction causing severe rash involving mucus membranes or skin  necrosis: No Has patient had a PCN reaction that required hospitalization No Has patient had a PCN reaction occurring within the last 10 years: No If all of the above answers are "NO", then may proceed with Cephalosporin use.     Family History  Problem Relation Age of Onset  . Lung cancer Mother 64  . Heart attack Father        Vague  . Hypertension Brother   . Esophageal cancer Brother   . Diabetes Paternal Grandmother        questionable  . Colon cancer Neg Hx   .  Kidney disease Neg Hx   . Liver disease Neg Hx      Prior to Admission medications   Medication Sig Start Date End Date Taking? Authorizing Provider  clonazePAM (KLONOPIN) 1 MG tablet Take 1 tablet (1 mg total) by mouth daily as needed (anxiety/sleep.). 02/15/16  Yes Dorothy Spark, MD  FLUoxetine (PROZAC) 20 MG capsule Take 2 capsules (40 mg total) by mouth daily. 09/27/16  Yes Rai, Ripudeep K, MD  levocetirizine (XYZAL) 5 MG tablet Take 5 mg by mouth every evening.   Yes [provider]  LUTEIN PO Take 1 capsule by mouth every evening.   Yes [provider]  mometasone (NASONEX) 50 MCG/ACT nasal spray Place 2 sprays into the nose daily.   Yes [provider]  ondansetron (ZOFRAN) 8 MG tablet Take 8 mg by mouth every 8 (eight) hours as needed for nausea or vomiting.   Yes [provider]  oxyCODONE-acetaminophen (PERCOCET) 10-325 MG tablet Take 1 tablet by mouth every 6 (six) hours as needed for pain.   Yes [provider]  RELPAX 40 MG tablet Take 40 mg by mouth every 2 (two) hours as needed. Take 1 tablet by mouth as needed for migraine and may take 1 additional tablet in 2 hours if no relief  01/16/16  Yes [provider]  traZODone (DESYREL) 100 MG tablet Take 200 mg by mouth at bedtime as needed for sleep. 11/12/16  Yes [provider]  vitamin E 100 UNIT capsule Take 100 Units by mouth every evening.   Yes [provider]  HYDROcodone-acetaminophen (NORCO/VICODIN) 5-325 MG tablet Take 2 tablets by mouth every 4 (four) hours as needed. Patient not taking: Reported on 02/12/2017 01/16/17   Fransico Meadow, PA-C  metroNIDAZOLE (FLAGYL) 500 MG tablet Take 1 tablet (500 mg total) by mouth 2 (two) times daily. Patient not taking: Reported on 01/23/2017 01/16/17   Sidney Ace    Physical Exam: Vitals:   02/17/17 1630 02/17/17 1645 02/17/17 1715 02/17/17 1800  BP: 107/68 117/61 119/75 (!) 113/59  Pulse: 62 67 65 66   Resp: 11 14 (!) 9 14  Temp:      TempSrc:      SpO2: 95% 99% 99% 97%  Weight:      Height:          Constitutional: NAD, calm, comfortable Vitals:   02/17/17 1630 02/17/17 1645 02/17/17 1715 02/17/17 1800  BP: 107/68 117/61 119/75 (!) 113/59  Pulse: 62 67 65 66  Resp: 11 14 (!) 9 14  Temp:      TempSrc:      SpO2: 95% 99% 99% 97%  Weight:      Height:       Eyes: PERRL ENMT: Mucous membranes are moist.   Neck: normal, supple Respiratory: clear to auscultation bilaterally, no wheezing, no crackles. Normal respiratory effort. No  accessory muscle use.  Cardiovascular: Regular rate and rhythm, no murmurs / rubs / gallops. No extremity edema. 2+ pedal pulses.   Abdomen: Bowel sound positive, abdomen soft, nontender nondistended. Musculoskeletal: no clubbing / cyanosis. No joint deformity upper and lower extremities.  Skin: Mild bruises on right lateral thigh. No induration Neurologic: CN 2-12 grossly intact. Sensation intact, Strength 5/5 in all 4.  Psychiatric: Normal judgment and insight. Alert and oriented x 3. Normal mood.    Labs on Admission: I have personally reviewed following labs and imaging studies  CBC:  Recent Labs Lab 02/12/17 1758 02/17/17 1203 02/17/17 1610  WBC 6.0 6.7  --   NEUTROABS  --   --  3.3  HGB 12.7 13.8  --   HCT 39.2 43.1  --   MCV 87.7 87.2  --   PLT 228 272  --    Basic Metabolic Panel:  Recent Labs Lab 02/12/17 1758 02/17/17 1203  NA 140 136  K 3.7 4.2  CL 104 105  CO2 26 23  GLUCOSE 100* 95  BUN 11 13  CREATININE 0.88 0.88  CALCIUM 9.3 9.6   GFR: Estimated Creatinine Clearance: 54.7 mL/min (by C-G formula based on SCr of 0.88 mg/dL). Liver Function Tests:  Recent Labs Lab 02/12/17 1758  AST 19  ALT 19  ALKPHOS 99  BILITOT 0.7  PROT 6.8  ALBUMIN 3.8    Recent Labs Lab 02/12/17 1758  LIPASE 13   No results for input(s): AMMONIA in the last 168 hours. Coagulation Profile:  Recent Labs Lab 02/17/17 1610   INR 1.02   Cardiac Enzymes: No results for input(s): CKTOTAL, CKMB, CKMBINDEX, TROPONINI in the last 168 hours. BNP (last 3 results) No results for input(s): PROBNP in the last 8760 hours. HbA1C: No results for input(s): HGBA1C in the last 72 hours. CBG: No results for input(s): GLUCAP in the last 168 hours. Lipid Profile: No results for input(s): CHOL, HDL, LDLCALC, TRIG, CHOLHDL, LDLDIRECT in the last 72 hours. Thyroid Function Tests: No results for input(s): TSH, T4TOTAL, FREET4, T3FREE, THYROIDAB in the last 72 hours. Anemia Panel: No results for input(s): VITAMINB12, FOLATE, FERRITIN, TIBC, IRON, RETICCTPCT in the last 72 hours. Urine analysis:    Component Value Date/Time   COLORURINE YELLOW 02/17/2017 1550   APPEARANCEUR CLEAR 02/17/2017 1550   LABSPEC 1.020 02/17/2017 1550   PHURINE 5.0 02/17/2017 1550   GLUCOSEU NEGATIVE 02/17/2017 1550   GLUCOSEU NEGATIVE 04/13/2007 1417   HGBUR NEGATIVE 02/17/2017 1550   BILIRUBINUR NEGATIVE 02/17/2017 1550   KETONESUR NEGATIVE 02/17/2017 1550   PROTEINUR 30 (A) 02/17/2017 1550   UROBILINOGEN 0.2 02/04/2015 1800   NITRITE NEGATIVE 02/17/2017 1550   LEUKOCYTESUR NEGATIVE 02/17/2017 1550   Sepsis Labs: !!!!!!!!!!!!!!!!!!!!!!!!!!!!!!!!!!!!!!!!!!!! @LABRCNTIP (procalcitonin:4,lacticidven:4) )No results found for this or any previous visit (from the past 240 hour(s)).   Radiological Exams on Admission: Ct Head Wo Contrast  Result Date: 02/17/2017 CLINICAL DATA:  Several falls today with headaches and neck pain, initial encounter EXAM: CT HEAD WITHOUT CONTRAST CT CERVICAL SPINE WITHOUT CONTRAST TECHNIQUE: Multidetector CT imaging of the head and cervical spine was performed following the standard protocol without intravenous contrast. Multiplanar CT image reconstructions of the cervical spine were also generated. COMPARISON:  01/23/2017 FINDINGS: CT HEAD FINDINGS Brain: Mild atrophic changes are seen. No findings to suggest acute  hemorrhage, acute infarction or space-occupying mass lesion are noted. Vascular: No hyperdense vessel or unexpected calcification. Skull: Normal. Negative for fracture or focal lesion. Sinuses/Orbits: No acute finding. Other: None.  CT CERVICAL SPINE FINDINGS Alignment: Within normal limits. Skull base and vertebrae: 7 cervical segments are well visualized. Osteophytic changes are noted most prominent at C6-7. Mild facet hypertrophic changes are noted similar to that seen on the prior exam. No acute fracture or acute facet abnormality is noted. Soft tissues and spinal canal: Within normal limits. Disc levels: Disc space narrowing and osteophytic changes at C6-7 are noted with mild central canal stenosis. Upper chest: Visualized upper abdomen is within normal limits. Other: None IMPRESSION: CT of the head: No acute abnormality noted. Mild atrophic changes are seen. CT of the cervical spine: Mild degenerative change stable from the previous exam. No acute abnormality noted. Electronically Signed   By: Inez Catalina M.D.   On: 02/17/2017 17:09   Ct Cervical Spine Wo Contrast  Result Date: 02/17/2017 CLINICAL DATA:  Several falls today with headaches and neck pain, initial encounter EXAM: CT HEAD WITHOUT CONTRAST CT CERVICAL SPINE WITHOUT CONTRAST TECHNIQUE: Multidetector CT imaging of the head and cervical spine was performed following the standard protocol without intravenous contrast. Multiplanar CT image reconstructions of the cervical spine were also generated. COMPARISON:  01/23/2017 FINDINGS: CT HEAD FINDINGS Brain: Mild atrophic changes are seen. No findings to suggest acute hemorrhage, acute infarction or space-occupying mass lesion are noted. Vascular: No hyperdense vessel or unexpected calcification. Skull: Normal. Negative for fracture or focal lesion. Sinuses/Orbits: No acute finding. Other: None. CT CERVICAL SPINE FINDINGS Alignment: Within normal limits. Skull base and vertebrae: 7 cervical segments are  well visualized. Osteophytic changes are noted most prominent at C6-7. Mild facet hypertrophic changes are noted similar to that seen on the prior exam. No acute fracture or acute facet abnormality is noted. Soft tissues and spinal canal: Within normal limits. Disc levels: Disc space narrowing and osteophytic changes at C6-7 are noted with mild central canal stenosis. Upper chest: Visualized upper abdomen is within normal limits. Other: None IMPRESSION: CT of the head: No acute abnormality noted. Mild atrophic changes are seen. CT of the cervical spine: Mild degenerative change stable from the previous exam. No acute abnormality noted. Electronically Signed   By: Inez Catalina M.D.   On: 02/17/2017 17:09    EKG: Independently reviewed. Sinus rhythm. Nonspecific T-wave.  Assessment/Plan Active Problems:   Falls  # Multiple falls concerning for possible TIA: -Patient reported falls 4 times when she felt her legs were giving out. Did not have any chest pain or shortness of breath. She has a history of TIA in the past, smoker, dyslipidemia. -Preliminary CT scan of head and cervical spine with no acute finding. -Admission for TIA workup including MRI brain, MRA brain and carotid Doppler echocardiogram lipid panel, A1c, PT OT evaluation, speech and swallow evaluation. -Patient with no focal neurological deficit on exam. -Start aspirin - IV hydration -Frequent neurologic  #Anxiety depression: Continue home medication including Prozac  #Chronic migraine and according back pain: Continue pain medication. PT/OT evaluation  # Smoking: Patient reported that she quit smoking about 3 weeks ago.  DVT prophylaxis: Lovenox subcutaneous Code Status: Full code Family Communication: No family at bedside Disposition Plan: Likely discharge home in 1-2 days Consults called: None Admission status: Observation   Yosiel Thieme Tanna Furry MD [facility] Pager [phone]  If 7PM-7AM, please contact  night-coverage [email or web address] Password [facility]  02/17/2017, 6:11 PM

## 2017-02-18 ENCOUNTER — Observation Stay (HOSPITAL_BASED_OUTPATIENT_CLINIC_OR_DEPARTMENT_OTHER): Payer: PPO

## 2017-02-18 DIAGNOSIS — E785 Hyperlipidemia, unspecified: Secondary | ICD-10-CM

## 2017-02-18 DIAGNOSIS — G6289 Other specified polyneuropathies: Secondary | ICD-10-CM | POA: Diagnosis not present

## 2017-02-18 DIAGNOSIS — F419 Anxiety disorder, unspecified: Secondary | ICD-10-CM | POA: Diagnosis not present

## 2017-02-18 DIAGNOSIS — I5032 Chronic diastolic (congestive) heart failure: Secondary | ICD-10-CM | POA: Diagnosis not present

## 2017-02-18 DIAGNOSIS — E440 Moderate protein-calorie malnutrition: Secondary | ICD-10-CM

## 2017-02-18 DIAGNOSIS — E639 Nutritional deficiency, unspecified: Secondary | ICD-10-CM

## 2017-02-18 DIAGNOSIS — I6522 Occlusion and stenosis of left carotid artery: Secondary | ICD-10-CM

## 2017-02-18 DIAGNOSIS — Z8673 Personal history of transient ischemic attack (TIA), and cerebral infarction without residual deficits: Secondary | ICD-10-CM

## 2017-02-18 DIAGNOSIS — W19XXXA Unspecified fall, initial encounter: Secondary | ICD-10-CM | POA: Diagnosis not present

## 2017-02-18 DIAGNOSIS — F329 Major depressive disorder, single episode, unspecified: Secondary | ICD-10-CM

## 2017-02-18 DIAGNOSIS — W19XXXS Unspecified fall, sequela: Secondary | ICD-10-CM

## 2017-02-18 DIAGNOSIS — G43009 Migraine without aura, not intractable, without status migrainosus: Secondary | ICD-10-CM

## 2017-02-18 DIAGNOSIS — E46 Unspecified protein-calorie malnutrition: Secondary | ICD-10-CM | POA: Insufficient documentation

## 2017-02-18 LAB — TSH: TSH: 0.917 u[IU]/mL (ref 0.350–4.500)

## 2017-02-18 LAB — FOLATE: FOLATE: 6.6 ng/mL (ref >5.9)

## 2017-02-18 LAB — POCT I-STAT, CHEM 8
BUN: 16 mg/dL (ref 6–20)
CALCIUM ION: 1.19 mmol/L (ref 1.15–1.40)
CHLORIDE: 125 mmol/L — AB (ref 101–111)
Creatinine, Ser: 0.9 mg/dL (ref 0.44–1.00)
GLUCOSE: 91 mg/dL (ref 65–99)
HCT: 44 % (ref 36.0–46.0)
Hemoglobin: 15 g/dL (ref 12.0–15.0)
Potassium: 3.9 mmol/L (ref 3.5–5.1)
Sodium: 140 mmol/L (ref 135–145)
TCO2: 27 mmol/L (ref 0–100)

## 2017-02-18 LAB — LIPID PANEL
CHOL/HDL RATIO: 3.7 ratio
Cholesterol: 178 mg/dL (ref 0–200)
HDL: 48 mg/dL (ref >40)
LDL CALC: 102 mg/dL — AB (ref 0–99)
Triglycerides: 139 mg/dL (ref <150)
VLDL: 28 mg/dL (ref 0–40)

## 2017-02-18 LAB — ECHOCARDIOGRAM COMPLETE
Height: 65 in
Weight: 2008 oz

## 2017-02-18 LAB — VITAMIN B12: Vitamin B-12: 264 pg/mL (ref 180–914)

## 2017-02-18 MED ORDER — ENSURE ENLIVE PO LIQD
237.0000 mL | Freq: Two times a day (BID) | ORAL | Status: DC
  Administered 2017-02-19 (×2): 237 mL via ORAL
  Filled 2017-02-18 (×4): qty 237

## 2017-02-18 NOTE — Care Management Obs Status (Signed)
[facility] NOTIFICATION   Patient Details  Name: Dorothy White MRN: [id number] Date of Birth: [birth year]   Medicare Observation Status Notification Given:  Yes    Pollie Friar, RN 02/18/2017, 1:19 PM

## 2017-02-18 NOTE — Progress Notes (Signed)
*  PRELIMINARY RESULTS* Vascular Ultrasound Carotid Duplex (Doppler) has been completed.  Preliminary findings: Right internal carotid artery demonstrates a 1-39% stenosis, the left distal internal carotid artery demonstrates a 40-59% stenosis. Bilateral vertebral arteries appears patent with antegrade flow.  Everrett Coombe 02/18/2017, 12:32 PM

## 2017-02-18 NOTE — Discharge Summary (Addendum)
Physician Discharge Summary  Dorothy White BDZ:[id number] DOB: 1951/01/02 DOA: 02/17/2017  PCP: Maurice Small, MD  Admit date: 02/17/2017 Discharge date: 02/19/2017  Admitted From: home Disposition:  home   Recommendations for Outpatient Follow-up:  1. Has an appt with neurology next month - she cannot remember whom the appt is with- needs EMG 2. Cont to replace and follow [place]:  none  Equipment/Devices:  none    Discharge Condition:  stable   CODE STATUS:  Full code Consultations:  Neurology    Discharge Diagnoses:  Principal Problem:   Falls Active Problems:   Hyperlipidemia   Anxiety and depression   Migraine without aura   Malnutrition of moderate degree   Hx of transient ischemic attack (TIA)   Left carotid stenosis- 40-59%    Subjective: Feels that her symptoms have resolved completely. No new symptoms.   Brief Summary: Dorothy White is a 66 y.o. female with medical history significant of anxiety- depression, chronic migraine, dyslipidemia, prior history of TIA in 2013, smoker and reported quitting about 3 weeks ago, mild LVH, presented with multiple episodes of falls. Patient reported that when she stood up, she felt dizziness associated with weakness in her legs and then fell. It happened about 4 times. She hit her head once which precipitated a headache and a neck ache. She denied losing consciousness.    Hospital Course:   Falls B12 level low normal Possible peripheral neuropathy Left carotid artery stenosis/ PVD - associated with dizziness - orthostatic vitals negative - MRI/MRA brain unrevealing - 2 D ECHO unchanged from prior- see report below - Carotid duplex show L distal internal carotid artery >>  40-59% stenosis- right is 1- 39%- see below - Telemetry reviewed, no arrhythmia noted  - A1c 4.8 - LDL 102 - TSH 0.917 - UDS + for Benzodiazepines which she is prescribed (Clonazepam daily) - exam suggestive of peripheral  neuropathy- needs EMG at Neuro office visit - low normal B12- given S/c B12 1000 mcg today and will be discharged on oral replacement - will have walker and outpt PT ordered  TIA in the past - in the past per history - start baby aspirin-   - LDL 102- will hold off on statin for now until issue with falls/ instability is addressed  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) - neuro has recommended Topamax 25 mg QHS x 1 wk, increase by 25 mg weekly until goal of 100 mg reached  Moderate Malnutrition - Ensure ordered  Admission in March for Atypical chest pain   All other medical problems have been stable  Discharge Instructions  Discharge Instructions    Diet - low sodium heart healthy    Complete by:  As directed    For home use only DME 4 wheeled rolling walker with seat    Complete by:  As directed    Patient needs a walker to treat with the following condition:  Falls frequently   Increase activity slowly    Complete by:  As directed      Allergies as of 02/19/2017      Reactions   Penicillins Hives   Has patient had a PCN reaction causing immediate rash, facial/tongue/throat swelling, SOB or lightheadedness with hypotension: Yes Has patient had a PCN reaction causing severe rash involving mucus membranes or skin necrosis: No Has patient had a PCN reaction that required hospitalization No Has patient had a PCN reaction occurring within the last 10 years:  No If all of the above answers are "NO", then may proceed with Cephalosporin use.      Medication List    TAKE these medications   aspirin EC 81 MG tablet Take 1 tablet (81 mg total) by mouth daily.   clonazePAM 1 MG tablet Commonly known as:  KLONOPIN Take 1 tablet (1 mg total) by mouth daily as needed (anxiety/sleep.).   feeding supplement (ENSURE ENLIVE) Liqd Take 237 mLs by mouth 2 (two) times daily between meals.   FLUoxetine 20 MG capsule Commonly known as:  PROZAC Take 2 capsules (40  mg total) by mouth daily.   levocetirizine 5 MG tablet Commonly known as:  XYZAL Take 5 mg by mouth every evening.   LUTEIN PO Take 1 capsule by mouth every evening.   mometasone 50 MCG/ACT nasal spray Commonly known as:  NASONEX Place 2 sprays into the nose daily.   ondansetron 8 MG tablet Commonly known as:  ZOFRAN Take 8 mg by mouth every 8 (eight) hours as needed for nausea or vomiting.   oxyCODONE-acetaminophen 10-325 MG tablet Commonly known as:  PERCOCET Take 1 tablet by mouth every 6 (six) hours as needed for pain.   RELPAX 40 MG tablet Generic drug:  eletriptan Take 40 mg by mouth every 2 (two) hours as needed. Take 1 tablet by mouth as needed for migraine and may take 1 additional tablet in 2 hours if no relief   topiramate 25 MG capsule Commonly known as:  TOPAMAX Take 1 capsule (25 mg total) by mouth at bedtime. Take for 1 wk.  Increase to 50 mg QHS on week 2, 75 mg QHS on week 3, 100 mg QHS on week 4 and onward   traZODone 100 MG tablet Commonly known as:  DESYREL Take 200 mg by mouth at bedtime as needed for sleep.   vitamin B-12 1000 MCG tablet Commonly known as:  CYANOCOBALAMIN Take 1 tablet (1,000 mcg total) by mouth daily.   vitamin E 100 UNIT capsule Take 100 Units by mouth every evening.            Durable Medical Equipment        Start     Ordered   02/19/17 0000  For home use only DME 4 wheeled rolling walker with seat    Question:  Patient needs a walker to treat with the following condition  Answer:  Falls frequently   02/19/17 1046      Allergies  Allergen Reactions  . Penicillins Hives    Has patient had a PCN reaction causing immediate rash, facial/tongue/throat swelling, SOB or lightheadedness with hypotension: Yes Has patient had a PCN reaction causing severe rash involving mucus membranes or skin necrosis: No Has patient had a PCN reaction that required hospitalization No Has patient had a PCN reaction occurring within the  last 10 years: No If all of the above answers are "NO", then may proceed with Cephalosporin use.      Procedures/Studies: 2 D ECHO Left ventricle: The cavity size was normal. Systolic function was   normal. The estimated ejection fraction was in the range of 60%   to 65%. Wall motion was normal; there were no regional wall   motion abnormalities. There was an increased relative   contribution of atrial contraction to ventricular filling.   Doppler parameters are consistent with abnormal left ventricular   relaxation (grade 1 diastolic dysfunction). - Aortic valve: Poorly visualized. - Right atrium: The atrium was mildly dilated. - Tricuspid  valve: There was trivial regurgitation. - Pulmonic valve: Poorly visualized. - Pulmonary arteries: PA peak pressure: 31 mm Hg (S).  Carotid Duplex: Carotid Duplex (Doppler) has been completed.  Preliminary findings: Right internal carotid artery demonstrates a 1-39% stenosis, the left distal internal carotid artery demonstrates a 40-59% stenosis. Bilateral vertebral arteries appears patent with antegrade flow.    Dg Chest 2 View  Result Date: 02/17/2017 CLINICAL DATA:  Recent falls EXAM: CHEST  2 VIEW COMPARISON:  01/23/2017 FINDINGS: Cardiac shadow is stable. The lungs are well aerated bilaterally. No focal infiltrate or sizable effusion is seen. Calcified breast implants are noted bilaterally. Postsurgical changes in the right proximal humerus are seen. IMPRESSION: No active cardiopulmonary disease. Electronically Signed   By: Inez Catalina M.D.   On: 02/17/2017 22:05   Dg Ribs Unilateral W/chest Left  Result Date: 01/23/2017 CLINICAL DATA:  Acute chest and left rib pain following fall yesterday. Initial encounter. EXAM: LEFT RIBS AND CHEST - 3+ VIEW COMPARISON:  12/25/2016 and prior exams FINDINGS: The cardiomediastinal silhouette is unremarkable. There is no evidence of focal airspace disease, pulmonary edema, suspicious pulmonary nodule/mass,  pleural effusion, or pneumothorax. No acute fracture or rib abnormality. Bilateral breast prosthesis identified. IMPRESSION: No acute abnormality.  No evidence of acute rib fracture. Electronically Signed   By: Margarette Canada M.D.   On: 01/23/2017 18:52   Ct Head Wo Contrast  Result Date: 02/17/2017 CLINICAL DATA:  Several falls today with headaches and neck pain, initial encounter EXAM: CT HEAD WITHOUT CONTRAST CT CERVICAL SPINE WITHOUT CONTRAST TECHNIQUE: Multidetector CT imaging of the head and cervical spine was performed following the standard protocol without intravenous contrast. Multiplanar CT image reconstructions of the cervical spine were also generated. COMPARISON:  01/23/2017 FINDINGS: CT HEAD FINDINGS Brain: Mild atrophic changes are seen. No findings to suggest acute hemorrhage, acute infarction or space-occupying mass lesion are noted. Vascular: No hyperdense vessel or unexpected calcification. Skull: Normal. Negative for fracture or focal lesion. Sinuses/Orbits: No acute finding. Other: None. CT CERVICAL SPINE FINDINGS Alignment: Within normal limits. Skull base and vertebrae: 7 cervical segments are well visualized. Osteophytic changes are noted most prominent at C6-7. Mild facet hypertrophic changes are noted similar to that seen on the prior exam. No acute fracture or acute facet abnormality is noted. Soft tissues and spinal canal: Within normal limits. Disc levels: Disc space narrowing and osteophytic changes at C6-7 are noted with mild central canal stenosis. Upper chest: Visualized upper abdomen is within normal limits. Other: None IMPRESSION: CT of the head: No acute abnormality noted. Mild atrophic changes are seen. CT of the cervical spine: Mild degenerative change stable from the previous exam. No acute abnormality noted. Electronically Signed   By: Inez Catalina M.D.   On: 02/17/2017 17:09   Ct Head Wo Contrast  Result Date: 01/23/2017 CLINICAL DATA:  Blurry vision, word-finding  difficulty and leg weakness beginning at 2030 hours yesterday. Multiple falls and head injury, mild headache. History of migraines, stroke. EXAM: CT HEAD WITHOUT CONTRAST CT CERVICAL SPINE WITHOUT CONTRAST TECHNIQUE: Multidetector CT imaging of the head and cervical spine was performed following the standard protocol without intravenous contrast. Multiplanar CT image reconstructions of the cervical spine were also generated. COMPARISON:  MRI of the head Dec 17, 2016 and MRI of the cervical spine Dec 19, 2015 FINDINGS: CT HEAD FINDINGS BRAIN: No intraparenchymal hemorrhage, mass effect nor midline shift. The ventricles and sulci are normal for age. No acute large vascular territory infarcts. Beam hardening artifact through  the pons most conspicuous on sagittal reformation. No abnormal extra-axial fluid collections. Basal cisterns are patent. VASCULAR: Trace calcific atherosclerosis of the carotid siphons. SKULL: No skull fracture. No significant scalp soft tissue swelling. SINUSES/ORBITS: The mastoid air-cells and included paranasal sinuses are well-aerated.The included ocular globes and orbital contents are non-suspicious. OTHER: None. CT CERVICAL SPINE FINDINGS ALIGNMENT: Maintained lordosis. Vertebral bodies in alignment. SKULL BASE AND VERTEBRAE: Cervical vertebral bodies and posterior elements are intact. Mild C6-7 disc height loss with mild ventral endplate spurring Z6-1 and C6-7. Moderate RIGHT C2-3 and LEFT C7-T1 facet arthropathy. No destructive bony lesions. C1-2 articulation maintained. SOFT TISSUES AND SPINAL CANAL: Normal. DISC LEVELS: No significant osseous canal stenosis. Mild LEFT greater than RIGHT C6-7 neural foraminal narrowing. UPPER CHEST: Lung apices are clear. OTHER: None. IMPRESSION: CT HEAD: Negative for age. CT CERVICAL SPINE:  Negative for age. Electronically Signed   By: Elon Alas M.D.   On: 01/23/2017 17:23   Ct Cervical Spine Wo Contrast  Result Date: 02/17/2017 CLINICAL  DATA:  Several falls today with headaches and neck pain, initial encounter EXAM: CT HEAD WITHOUT CONTRAST CT CERVICAL SPINE WITHOUT CONTRAST TECHNIQUE: Multidetector CT imaging of the head and cervical spine was performed following the standard protocol without intravenous contrast. Multiplanar CT image reconstructions of the cervical spine were also generated. COMPARISON:  01/23/2017 FINDINGS: CT HEAD FINDINGS Brain: Mild atrophic changes are seen. No findings to suggest acute hemorrhage, acute infarction or space-occupying mass lesion are noted. Vascular: No hyperdense vessel or unexpected calcification. Skull: Normal. Negative for fracture or focal lesion. Sinuses/Orbits: No acute finding. Other: None. CT CERVICAL SPINE FINDINGS Alignment: Within normal limits. Skull base and vertebrae: 7 cervical segments are well visualized. Osteophytic changes are noted most prominent at C6-7. Mild facet hypertrophic changes are noted similar to that seen on the prior exam. No acute fracture or acute facet abnormality is noted. Soft tissues and spinal canal: Within normal limits. Disc levels: Disc space narrowing and osteophytic changes at C6-7 are noted with mild central canal stenosis. Upper chest: Visualized upper abdomen is within normal limits. Other: None IMPRESSION: CT of the head: No acute abnormality noted. Mild atrophic changes are seen. CT of the cervical spine: Mild degenerative change stable from the previous exam. No acute abnormality noted. Electronically Signed   By: Inez Catalina M.D.   On: 02/17/2017 17:09   Ct Cervical Spine Wo Contrast  Result Date: 01/23/2017 CLINICAL DATA:  Blurry vision, word-finding difficulty and leg weakness beginning at 2030 hours yesterday. Multiple falls and head injury, mild headache. History of migraines, stroke. EXAM: CT HEAD WITHOUT CONTRAST CT CERVICAL SPINE WITHOUT CONTRAST TECHNIQUE: Multidetector CT imaging of the head and cervical spine was performed following the  standard protocol without intravenous contrast. Multiplanar CT image reconstructions of the cervical spine were also generated. COMPARISON:  MRI of the head Dec 17, 2016 and MRI of the cervical spine Dec 19, 2015 FINDINGS: CT HEAD FINDINGS BRAIN: No intraparenchymal hemorrhage, mass effect nor midline shift. The ventricles and sulci are normal for age. No acute large vascular territory infarcts. Beam hardening artifact through the pons most conspicuous on sagittal reformation. No abnormal extra-axial fluid collections. Basal cisterns are patent. VASCULAR: Trace calcific atherosclerosis of the carotid siphons. SKULL: No skull fracture. No significant scalp soft tissue swelling. SINUSES/ORBITS: The mastoid air-cells and included paranasal sinuses are well-aerated.The included ocular globes and orbital contents are non-suspicious. OTHER: None. CT CERVICAL SPINE FINDINGS ALIGNMENT: Maintained lordosis. Vertebral bodies in alignment. SKULL BASE AND  VERTEBRAE: Cervical vertebral bodies and posterior elements are intact. Mild C6-7 disc height loss with mild ventral endplate spurring P5-0 and C6-7. Moderate RIGHT C2-3 and LEFT C7-T1 facet arthropathy. No destructive bony lesions. C1-2 articulation maintained. SOFT TISSUES AND SPINAL CANAL: Normal. DISC LEVELS: No significant osseous canal stenosis. Mild LEFT greater than RIGHT C6-7 neural foraminal narrowing. UPPER CHEST: Lung apices are clear. OTHER: None. IMPRESSION: CT HEAD: Negative for age. CT CERVICAL SPINE:  Negative for age. Electronically Signed   By: Elon Alas M.D.   On: 01/23/2017 17:23   Mr Jodene Nam Head Wo Contrast  Result Date: 02/17/2017 CLINICAL DATA:  Initial evaluation for acute ataxia. EXAM: MRI HEAD WITHOUT CONTRAST MRA HEAD WITHOUT CONTRAST TECHNIQUE: Multiplanar, multiecho pulse sequences of the brain and surrounding structures were obtained without intravenous contrast. Angiographic images of the head were obtained using MRA technique without  contrast. COMPARISON:  Prior CT from earlier the same day as well as previous MRI from 12/17/2016. FINDINGS: MRI HEAD FINDINGS Brain: Cerebral volume stable, and wound within normal limits for age. Mild nonspecific cerebral white matter changes, stable from previous. No evidence for acute or subacute ischemia. Gray-white matter differentiation maintained. No evidence for chronic infarction. No evidence for acute or chronic intracranial hemorrhage. No mass lesion, midline shift or mass effect. No hydrocephalus. No extra-axial fluid collection. Major dural sinuses are grossly patent. Pituitary gland suprasellar region normal. Midline structures intact and normal. Vascular: Major intracranial vascular flow voids are maintained. Skull and upper cervical spine: Craniocervical junction normal. Visualized upper cervical spine unremarkable. Bone marrow signal intensity within normal limits. No scalp soft tissue abnormality. Sinuses/Orbits: Globes and orbital soft tissues within normal limits. Paranasal sinuses are clear. No mastoid effusion. Inner ear structures normal. MRA HEAD FINDINGS ANTERIOR CIRCULATION: Distal cervical segments of the internal carotid arteries are patent with antegrade flow. Petrous, cavernous, and supraclinoid segments patent without flow-limiting stenosis. ICA termini widely patent. A1 segments patent. Anterior communicating artery normal. Anterior cerebral artery is widely patent to their distal aspects. The M1 segments patent without stenosis or occlusion. No proximal M2 occlusion or stenosis. Distal MCA branches well opacified and symmetric. POSTERIOR CIRCULATION: Vertebral arteries patent to the vertebrobasilar junction without stenosis. Posterior inferior cerebral arteries patent bilaterally. Basilar artery widely patent to its distal aspect. Superior cerebral arteries patent bilaterally. Basilar tip ectatic without discrete aneurysm. Posterior cerebral arteries largely supplied via the basilar  artery and are widely patent to their distal aspects without stenosis. Small right posterior communicating artery noted. No discrete aneurysm or vascular malformation. IMPRESSION: MRI HEAD IMPRESSION: 1. No acute intracranial process. 2. Stable nonspecific mild cerebral white matter disease. MRA HEAD IMPRESSION: Normal intracranial MRA. Electronically Signed   By: Jeannine Boga M.D.   On: 02/17/2017 21:27   Mr Brain Wo Contrast  Result Date: 02/17/2017 CLINICAL DATA:  Initial evaluation for acute ataxia. EXAM: MRI HEAD WITHOUT CONTRAST MRA HEAD WITHOUT CONTRAST TECHNIQUE: Multiplanar, multiecho pulse sequences of the brain and surrounding structures were obtained without intravenous contrast. Angiographic images of the head were obtained using MRA technique without contrast. COMPARISON:  Prior CT from earlier the same day as well as previous MRI from 12/17/2016. FINDINGS: MRI HEAD FINDINGS Brain: Cerebral volume stable, and wound within normal limits for age. Mild nonspecific cerebral white matter changes, stable from previous. No evidence for acute or subacute ischemia. Gray-white matter differentiation maintained. No evidence for chronic infarction. No evidence for acute or chronic intracranial hemorrhage. No mass lesion, midline shift or mass effect. No  hydrocephalus. No extra-axial fluid collection. Major dural sinuses are grossly patent. Pituitary gland suprasellar region normal. Midline structures intact and normal. Vascular: Major intracranial vascular flow voids are maintained. Skull and upper cervical spine: Craniocervical junction normal. Visualized upper cervical spine unremarkable. Bone marrow signal intensity within normal limits. No scalp soft tissue abnormality. Sinuses/Orbits: Globes and orbital soft tissues within normal limits. Paranasal sinuses are clear. No mastoid effusion. Inner ear structures normal. MRA HEAD FINDINGS ANTERIOR CIRCULATION: Distal cervical segments of the internal  carotid arteries are patent with antegrade flow. Petrous, cavernous, and supraclinoid segments patent without flow-limiting stenosis. ICA termini widely patent. A1 segments patent. Anterior communicating artery normal. Anterior cerebral artery is widely patent to their distal aspects. The M1 segments patent without stenosis or occlusion. No proximal M2 occlusion or stenosis. Distal MCA branches well opacified and symmetric. POSTERIOR CIRCULATION: Vertebral arteries patent to the vertebrobasilar junction without stenosis. Posterior inferior cerebral arteries patent bilaterally. Basilar artery widely patent to its distal aspect. Superior cerebral arteries patent bilaterally. Basilar tip ectatic without discrete aneurysm. Posterior cerebral arteries largely supplied via the basilar artery and are widely patent to their distal aspects without stenosis. Small right posterior communicating artery noted. No discrete aneurysm or vascular malformation. IMPRESSION: MRI HEAD IMPRESSION: 1. No acute intracranial process. 2. Stable nonspecific mild cerebral white matter disease. MRA HEAD IMPRESSION: Normal intracranial MRA. Electronically Signed   By: Jeannine Boga M.D.   On: 02/17/2017 21:27       Discharge Exam: Vitals:   02/19/17 0525 02/19/17 0820  BP: (!) 94/53 (!) 117/45  Pulse: 69 78  Resp: 18 18  Temp: 98.5 F (36.9 C) 97.8 F (36.6 C)   Vitals:   02/18/17 2137 02/19/17 0143 02/19/17 0525 02/19/17 0820  BP: (!) 92/50 (!) 93/51 (!) 94/53 (!) 117/45  Pulse: 61 66 69 78  Resp: 20 18 18 18   Temp: 98 F (36.7 C) 98.2 F (36.8 C) 98.5 F (36.9 C) 97.8 F (36.6 C)  TempSrc: Oral Oral Oral Oral  SpO2: 100% 100% 100% 98%  Weight:      Height:        General: Pt is alert, awake, not in acute distress Cardiovascular: RRR, S1/S2 +, no rubs, no gallops Respiratory: CTA bilaterally, no wheezing, no rhonchi Abdominal: Soft, NT, ND, bowel sounds + Extremities: no edema, no cyanosis Neuro:  subjective decreased sensation from feet to thighs, strength 5/5, CN 2-12 intact    The results of significant diagnostics from this hospitalization (including imaging, microbiology, ancillary and laboratory) are listed below for reference.     Microbiology: No results found for this or any previous visit (from the past 240 hour(s)).   Labs: BNP (last 3 results)  Recent Labs  09/26/16 1924  BNP 8.9   Basic Metabolic Panel:  Recent Labs Lab 02/12/17 1758 02/17/17 1203 02/17/17 1619  NA 140 136 140  K 3.7 4.2 3.9  CL 104 105 125*  CO2 26 23  --   GLUCOSE 100* 95 91  BUN 11 13 16   CREATININE 0.88 0.88 0.90  CALCIUM 9.3 9.6  --    Liver Function Tests:  Recent Labs Lab 02/12/17 1758  AST 19  ALT 19  ALKPHOS 99  BILITOT 0.7  PROT 6.8  ALBUMIN 3.8    Recent Labs Lab 02/12/17 1758  LIPASE 13   No results for input(s): AMMONIA in the last 168 hours. CBC:  Recent Labs Lab 02/12/17 1758 02/17/17 1203 02/17/17 1610 02/17/17 1619  WBC  6.0 6.7  --   --   NEUTROABS  --   --  3.3  --   HGB 12.7 13.8  --  15.0  HCT 39.2 43.1  --  44.0  MCV 87.7 87.2  --   --   PLT 228 272  --   --    Cardiac Enzymes: No results for input(s): CKTOTAL, CKMB, CKMBINDEX, TROPONINI in the last 168 hours. BNP: Invalid input(s): POCBNP CBG: No results for input(s): GLUCAP in the last 168 hours. D-Dimer No results for input(s): DDIMER in the last 72 hours. Hgb A1c  Recent Labs  02/18/17 1955  HGBA1C 4.8   Lipid Profile  Recent Labs  02/18/17 1954  CHOL 178  HDL 48  LDLCALC 102*  TRIG 139  CHOLHDL 3.7   Thyroid function studies  Recent Labs  02/18/17 1954  TSH 0.917   Anemia work up  Recent Labs  02/18/17 1954 02/18/17 1955  VITAMINB12 264  --   FOLATE  --  6.6   Urinalysis    Component Value Date/Time   COLORURINE YELLOW 02/17/2017 [place] 02/17/2017 1550   LABSPEC 1.020 02/17/2017 1550   PHURINE 5.0 02/17/2017 1550    GLUCOSEU NEGATIVE 02/17/2017 1550   GLUCOSEU NEGATIVE 04/13/2007 1417   HGBUR NEGATIVE 02/17/2017 1550   BILIRUBINUR NEGATIVE 02/17/2017 1550   KETONESUR NEGATIVE 02/17/2017 1550   PROTEINUR 30 (A) 02/17/2017 1550   UROBILINOGEN 0.2 02/04/2015 1800   NITRITE NEGATIVE 02/17/2017 1550   LEUKOCYTESUR NEGATIVE 02/17/2017 1550   Sepsis Labs Invalid input(s): PROCALCITONIN,  WBC,  LACTICIDVEN Microbiology No results found for this or any previous visit (from the past 240 hour(s)).   Time coordinating discharge: Over 30 minutes  SIGNED:   Debbe Odea, MD  [facility] 02/19/2017, 10:51 AM Pager   If 7PM-7AM, please contact night-coverage [email or web address] Password [email or web address]

## 2017-02-18 NOTE — Progress Notes (Signed)
Initial Nutrition Assessment  DOCUMENTATION CODES:  Non-severe (moderate) malnutrition in context of chronic illness  INTERVENTION:  Ensure Enlive po BID, each supplement provides 350 kcal and 20 grams of protein  Ice cream with meals to help increase caloric intake  Recommend diet liberalization to regular to spur increased po intake. Discussed how to use [employer] service.  Anxiety suspected cause of malnutrition.   NUTRITION DIAGNOSIS:  Malnutrition related to Severe anxiety as evidenced by moderate depletions of muscle/fat mass and an estimated energy intake  That has met < or equal to 75% of needs for > or equal to 1 month.  GOAL:  Patient will meet greater than or equal to 90% of their needs  MONITOR:  PO intake, Supplement acceptance, Labs, Weight trends  REASON FOR ASSESSMENT:  Malnutrition Screening Tool    ASSESSMENT:  66 y/o female PMHx anxiety, depression, chronic back pain, GERD, CAD, CVA, HLD. Presents after suffering multiple falls. Workup unremarkable. Admitted for evaluation.   Pt reports having a chronically poor PO intake for 1 year. She only eats 1 time a day. She says she just doesn't get hungry. She lives on her own, but denies having trouble fixing herself items to eat. She does drink 1 Ensure a day as well. She takes a MVI, Vit C, Vit D, Lutein and Vit E. Aggravating factors include occasional constipation for which she takes magnesium and occasional nausea.   She says she "knows" she has lost weight, but does not know what her UBW is. Per review of chart from 2017, when she said she began with her poor appetite, she had a UBW of 125-130 lbs. Her weight now may be slightly below this, but appears to have gained weight in the past few months; she was <120 this past spring.   Per further chart review of ED notes 1 week ago. Pt had called EMS complaining of nausea, anxiety and not eating in 4 days. Suspect patients anxiety is cause for chronic poor  appetite and muscle wasting.  It likely that it also makes her feel she has lost more weight than has.  Phsyical Exam: Mild-moderate upper/lower body muscle/fat wasting. Moderate to Severe fat wasting of thorax.   At this time, patient is not open to receiving supplements even though she says she drinks them at home. Offered other items, such as icecream, which she accepted. RD told her how to use the "expressly for you" service to order meals.  Meal intake records shows she has eaten 100% of only meal documented  Labs: Reviewed, unremarkable Meds: PRN oxycodone     Recent Labs Lab 02/12/17 1758 02/17/17 1203 02/17/17 1619  NA 140 136 140  K 3.7 4.2 3.9  CL 104 105 125*  CO2 26 23  --   BUN '11 13 16  '$ CREATININE 0.88 0.88 0.90  CALCIUM 9.3 9.6  --   GLUCOSE 100* 95 91    Diet Order:  Diet Heart Room service appropriate? Yes; Fluid consistency: Thin Diet - low sodium heart healthy  Skin:  Excoriated Arms,   Last BM:  7/24  Height:  Ht Readings from Last 1 Encounters:  02/17/17 '5\' 5"'$  (1.651 m)   Weight:  Wt Readings from Last 1 Encounters:  02/17/17 125 lb 8 oz (56.9 kg)   Wt Readings from Last 10 Encounters:  02/17/17 125 lb 8 oz (56.9 kg)  02/12/17 125 lb (56.7 kg)  01/23/17 119 lb (54 kg)  12/25/16 119 lb (54 kg)  12/01/16 122 lb 3.2 oz (55.4 kg)  11/26/16 120 lb (54.4 kg)  11/13/16 119 lb (54 kg)  09/26/16 118 lb 6.4 oz (53.7 kg)  08/25/16 130 lb (59 kg)  04/25/16 122 lb 12.7 oz (55.7 kg)   Ideal Body Weight:  56.82 kg  BMI:  Body mass index is 20.88 kg/m.  Estimated Nutritional Needs:  Kcal:  1600-1800 kcals (28-32 kcal/kg bw) Protein:  63-74g Pro (1.1-1.3 g/kg bw) Fluid:  >1.7 L fluid (30 ml/kg bw)  EDUCATION NEEDS:  No education needs identified at this time  Burtis Junes RD, LDN, [facility] Pager: [phone] 02/18/2017 5:06 PM

## 2017-02-18 NOTE — Evaluation (Signed)
Occupational Therapy Evaluation and Discharge Patient Details Name: Dorothy White MRN: [id number] DOB: [birth year] Today's Date: 02/18/2017    History of Present Illness 67 y.o. female with medical history significant of anxiety depression, chronic migraine, dyslipidemia, prior history of TIA in 2013, smoker and reported quitting about 3 weeks ago, mild LVH, presented with multiple episodes of falls. Workup underway.   Clinical Impression   PTA pt independent in ADL/IADL and mobility with SPC (as needed). Pt is currently supervision for ADL and min A ([place]) for functional transfers. Pt reports RUE weakness, but OT found UE to be symmetrical in strength, ROM and movement patterns. No nystagmus noted during transitions or with head turns. Talked at length about safety in the tub during showering. Pt recently joined silver sneakers, which OT encouraged. Pt's main fear is falling, which PT will address with mobility. OT to sign off at this time. Thank you for this referral.     Follow Up Recommendations  Supervision/Assistance - 24 hour (for mobility)    Equipment Recommendations  None recommended by OT (Pt has appropriate DME)    Recommendations for Other Services PT consult     Precautions / Restrictions Precautions Precautions: Fall Restrictions Weight Bearing Restrictions: No      Mobility Bed Mobility Overal bed mobility: Modified Independent             General bed mobility comments: use of bed rails, but no assist needed  Transfers Overall transfer level: Needs assistance Equipment used: None Transfers: Sit to/from Stand Sit to Stand: Supervision         General transfer comment: no physical assist, one noted LOB posteriorly during stand from bed, calves braced against bed during transition. Able to power up from [facility] without difficulty    Balance Overall balance assessment: History of Falls (4 falls in one day)                                          ADL either performed or assessed with clinical judgement   ADL Overall ADL's : Needs assistance/impaired Eating/Feeding: Modified independent;Sitting Eating/Feeding Details (indicate cue type and reason): Pt reports no problems holding onto utentsils during eating Grooming: Wash/dry hands;Wash/dry face;Oral care;Min guard;Standing Grooming Details (indicate cue type and reason): close min guard for safety Upper Body Bathing: Supervision/ safety   Lower Body Bathing: Supervison/ safety   Upper Body Dressing : Supervision/safety   Lower Body Dressing: Supervision/safety;Sit to/from stand Lower Body Dressing Details (indicate cue type and reason): to don socks BLE Toilet Transfer: Minimal assistance;Ambulation;Comfort height toilet;Grab bars Toilet Transfer Details (indicate cue type and reason): HHA to bathroom, Pt reaching out for furniture in room for added balance Toileting- Clothing Manipulation and Hygiene: Supervision/safety;Sit to/from stand Toileting - Clothing Manipulation Details (indicate cue type and reason): able to perform peri care with no assist needed Tub/ Shower Transfer: Tub transfer;Minimal assistance;Ambulation   Functional mobility during ADLs: Minimal assistance (HHA - declined use of RW in room) General ADL Comments: Pt is overall supervision for ADL. Has non-slip mat in tub for extra safety     Vision Baseline Vision/History: Cataracts Patient Visual Report: No change from baseline Vision Assessment?: Yes Eye Alignment: Within Functional Limits Ocular Range of Motion: Within Functional Limits Alignment/Gaze Preference: Within Defined Limits Tracking/Visual Pursuits: Able to track stimulus in all quads without difficulty Visual Fields: No apparent deficits Additional Comments: No signs  of nystagmus with sudden turns of the head     Perception     Praxis      Pertinent Vitals/Pain Pain Assessment: Faces Faces Pain Scale: Hurts little  more Pain Location: lower back Pain Descriptors / Indicators: Sore Pain Intervention(s): Monitored during session;Repositioned;Patient requesting pain meds-RN notified     Hand Dominance Right   Extremity/Trunk Assessment Upper Extremity Assessment Upper Extremity Assessment: Overall WFL for tasks assessed (Pt states strength L> R but not discernable to OT)   Lower Extremity Assessment Lower Extremity Assessment: Defer to PT evaluation RLE Deficits / Details: modest assymetrical weakness RLE 4+/5 compared to 5/5 RLE Coordination: decreased gross motor (during functional ambulation ) LLE Sensation: decreased light touch (per reports patient states diminished sensation )       Communication Communication Communication: No difficulties   Cognition Arousal/Alertness: Awake/alert Behavior During Therapy: WFL for tasks assessed/performed Overall Cognitive Status: No family/caregiver present to determine baseline cognitive functioning                                 General Comments: Pt tearful about lack of diagnosis and fearful of falling   General Comments  Pt tearful and fearful about falling    Exercises     Shoulder Instructions      Home Living Family/patient expects to be discharged to:: Private residence Living Arrangements: Non-relatives/Friends Available Help at Discharge: Friend(s);Available PRN/intermittently Type of Home: House Home Access: Level entry     Home Layout: Multi-level;Able to live on main level with bedroom/bathroom (Patient lives on 1st floor and friend lives on 2nd floor) Alternate Level Stairs-Number of Steps: flight Alternate Level Stairs-Rails: Right Bathroom Shower/Tub: Teacher, early years/pre: Handicapped height     Home Equipment: [place] - single point;Bedside commode;Grab bars - tub/shower          Prior Functioning/Environment Level of Independence: Independent        Comments: reports use of cane when  unsteady, patient reports that she falls alot        OT Problem List: Impaired balance (sitting and/or standing);Decreased safety awareness;Decreased knowledge of use of DME or AE      OT Treatment/Interventions:      OT Goals(Current goals can be found in the care plan section) Acute Rehab OT Goals Patient Stated Goal: to find out what is causing this OT Goal Formulation: With patient Time For Goal Achievement: 03/04/17 Potential to Achieve Goals: Good  OT Frequency:     Barriers to D/C:            Co-evaluation              AM-PAC PT "6 Clicks" Daily Activity     Outcome Measure Help from another person eating meals?: None Help from another person taking care of personal grooming?: A Little Help from another person toileting, which includes using toliet, bedpan, or urinal?: A Little Help from another person bathing (including washing, rinsing, drying)?: A Little Help from another person to put on and taking off regular upper body clothing?: None Help from another person to put on and taking off regular lower body clothing?: A Little 6 Click Score: 20   End of Session Equipment Utilized During Treatment: Gait belt Nurse Communication: Mobility status;Patient requests pain meds  Activity Tolerance: Patient tolerated treatment well Patient left: in bed;with call bell/phone within reach;with bed alarm set;with SCD's reapplied  OT Visit Diagnosis: Unsteadiness  on feet (R26.81);Other abnormalities of gait and mobility (R26.89);History of falling (Z91.81);Repeated falls (R29.6)                Time: 1165-7903 OT Time Calculation (min): 22 min Charges:  OT General Charges $OT Visit: 1 Procedure OT Evaluation $OT Eval Moderate Complexity: 1 Procedure G-Codes: OT G-codes **NOT FOR INPATIENT CLASS** Functional Assessment Tool Used: AM-PAC 6 Clicks Daily Activity Functional Limitation: Self care Self Care Current Status (Y3338): At least 20 percent but less than 40  percent impaired, limited or restricted Self Care Goal Status (V2919): At least 1 percent but less than 20 percent impaired, limited or restricted Self Care Discharge Status [phone]): At least 20 percent but less than 40 percent impaired, limited or restricted   Hulda Humphrey OTR/L [place] 02/18/2017, 3:54 PM

## 2017-02-18 NOTE — Progress Notes (Signed)
SLP Cancellation Note  Patient Details Name: Dorothy White MRN: [id number] DOB: 12-15-1950   Cancelled treatment:       Reason Eval/Treat Not Completed: SLP screened, no needs identified, will sign off. MRI negative for acute findings. Pt conversant and oriented at bedside, reports not having any acute changes but did mention occasional word finding difficulties which have been occurring intermittently for about the past 1-2 months. Asked pt to monitor this and f/u with MD if difficulties persist or worsen. Will sign off for acute needs; please re-consult if needs arise.   Kern Reap, [place], CCC-SLP 02/18/2017, 1:43 PM [phone]
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
## 2017-02-18 NOTE — Progress Notes (Signed)
PROGRESS NOTE    MEI SUITS   YTK:[id number]  DOB: 04-28-1951  DOA: 02/17/2017 PCP: Maurice Small, MD   Brief Narrative:  Dorothy UNCAPHER is a 66 y.o.femalewith medical history significant of anxiety depression, chronic migraine, dyslipidemia, prior history of TIA in 2013, smoker and reported quitting about 3 weeks ago, mild LVH, presented with multiple episodes of falls. Patient reported that when she stood up, she felt dizziness associated with weakness in her legs and then fell. It happened about 4 times. She hit her head once which precipitated a headache and a neck ache. She denied losing consciousness.     Subjective: No longer feeling unsteady today. No dizziness.  ROS: no complaints of nausea, vomiting, constipation diarrhea, cough, dyspnea or dysuria. No other complaints.   Assessment & Plan:  Falls - associated with dizziness - cause uncertain but she is walking well today and not falling - orthostatic vitals negative - MRI/MRA brain unrevealing - 2 D ECHO unchanged from prior- see report below - Carotid duplex show L distal internal carotid artery >>  40-59% stenosis- right is 1- 39%- see below - Telemetry reviewed, no arrhythmia noted  - UDS + for Benzodiazepines which she is prescribed (Clonazepam daily) - neuro suspects b/l LE Neuropathy may also be contributing- recommending outpt f/u with neuro for EMG/ NCS - PT notes the patient is very unsteady and high risk for falls- recommending outpt PT  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) - neuro has recommended Topamax 25 mg QHS x 1 wk, increase by 25 mg weekly until goal of 100 mg reached   Admission in March for Atypical chest pain  DVT prophylaxis: Lovenox Code Status: Full code Family Communication:  Disposition Plan:  Consultants:   neuro Procedures:    Antimicrobials:  Anti-infectives    None       Objective: Vitals:   02/18/17 0512 02/18/17 0600 02/18/17 0859  02/18/17 1310  BP: (!) 92/53 (!) 99/52 (!) 94/57 (!) 98/48  Pulse: 60 (!) 54 69 62  Resp:  16 16 16   Temp:   98.2 F (36.8 C) 98.1 F (36.7 C)  TempSrc:   Oral Oral  SpO2:  97% 96% 96%  Weight:      Height:        Intake/Output Summary (Last 24 hours) at 02/18/17 1809 Last data filed at 02/18/17 7517  Gross per 24 hour  Intake              970 ml  Output                0 ml  Net              970 ml   Filed Weights   02/17/17 1146 02/17/17 2030  Weight: 54.4 kg (120 lb) 56.9 kg (125 lb 8 oz)    Examination: General exam: Appears comfortable  HEENT: PERRLA, oral mucosa moist, no sclera icterus or thrush Respiratory system: Clear to auscultation. Respiratory effort normal. Cardiovascular system: S1 & S2 heard, RRR.  No murmurs  Gastrointestinal system: Abdomen soft, non-tender, nondistended. Normal bowel sound. No organomegaly Central nervous system: Alert and oriented. No focal neurological deficits. Extremities: No cyanosis, clubbing or edema Skin: No rashes or ulcers Psychiatry:  Mood & affect appropriate.     Data Reviewed: I have personally reviewed following labs and imaging studies  CBC:  Recent Labs Lab 02/12/17 1758 02/17/17 1203 02/17/17 1610 02/17/17 1619  WBC 6.0 6.7  --   --  NEUTROABS  --   --  3.3  --   HGB 12.7 13.8  --  15.0  HCT 39.2 43.1  --  44.0  MCV 87.7 87.2  --   --   PLT 228 272  --   --    Basic Metabolic Panel:  Recent Labs Lab 02/12/17 1758 02/17/17 1203 02/17/17 1619  NA 140 136 140  K 3.7 4.2 3.9  CL 104 105 125*  CO2 26 23  --   GLUCOSE 100* 95 91  BUN 11 13 16   CREATININE 0.88 0.88 0.90  CALCIUM 9.3 9.6  --    GFR: Estimated Creatinine Clearance: 56 mL/min (by C-G formula based on SCr of 0.9 mg/dL). Liver Function Tests:  Recent Labs Lab 02/12/17 1758  AST 19  ALT 19  ALKPHOS 99  BILITOT 0.7  PROT 6.8  ALBUMIN 3.8    Recent Labs Lab 02/12/17 1758  LIPASE 13   No results for input(s): AMMONIA in  the last 168 hours. Coagulation Profile:  Recent Labs Lab 02/17/17 1610  INR 1.02   Cardiac Enzymes: No results for input(s): CKTOTAL, CKMB, CKMBINDEX, TROPONINI in the last 168 hours. BNP (last 3 results) No results for input(s): PROBNP in the last 8760 hours. HbA1C: No results for input(s): HGBA1C in the last 72 hours. CBG: No results for input(s): GLUCAP in the last 168 hours. Lipid Profile: No results for input(s): CHOL, HDL, LDLCALC, TRIG, CHOLHDL, LDLDIRECT in the last 72 hours. Thyroid Function Tests: No results for input(s): TSH, T4TOTAL, FREET4, T3FREE, THYROIDAB in the last 72 hours. Anemia Panel: No results for input(s): VITAMINB12, FOLATE, FERRITIN, TIBC, IRON, RETICCTPCT in the last 72 hours. Urine analysis:    Component Value Date/Time   COLORURINE YELLOW 02/17/2017 1550   APPEARANCEUR CLEAR 02/17/2017 1550   LABSPEC 1.020 02/17/2017 1550   PHURINE 5.0 02/17/2017 1550   GLUCOSEU NEGATIVE 02/17/2017 1550   GLUCOSEU NEGATIVE 04/13/2007 1417   HGBUR NEGATIVE 02/17/2017 1550   BILIRUBINUR NEGATIVE 02/17/2017 1550   KETONESUR NEGATIVE 02/17/2017 1550   PROTEINUR 30 (A) 02/17/2017 1550   UROBILINOGEN 0.2 02/04/2015 1800   NITRITE NEGATIVE 02/17/2017 1550   LEUKOCYTESUR NEGATIVE 02/17/2017 1550   Sepsis Labs: @LABRCNTIP (procalcitonin:4,lacticidven:4) )No results found for this or any previous visit (from the past 240 hour(s)).       Radiology Studies: Dg Chest 2 View  Result Date: 02/17/2017 CLINICAL DATA:  Recent falls EXAM: CHEST  2 VIEW COMPARISON:  01/23/2017 FINDINGS: Cardiac shadow is stable. The lungs are well aerated bilaterally. No focal infiltrate or sizable effusion is seen. Calcified breast implants are noted bilaterally. Postsurgical changes in the right proximal humerus are seen. IMPRESSION: No active cardiopulmonary disease. Electronically Signed   By: Inez Catalina M.D.   On: 02/17/2017 22:05   Ct Head Wo Contrast  Result Date:  02/17/2017 CLINICAL DATA:  Several falls today with headaches and neck pain, initial encounter EXAM: CT HEAD WITHOUT CONTRAST CT CERVICAL SPINE WITHOUT CONTRAST TECHNIQUE: Multidetector CT imaging of the head and cervical spine was performed following the standard protocol without intravenous contrast. Multiplanar CT image reconstructions of the cervical spine were also generated. COMPARISON:  01/23/2017 FINDINGS: CT HEAD FINDINGS Brain: Mild atrophic changes are seen. No findings to suggest acute hemorrhage, acute infarction or space-occupying mass lesion are noted. Vascular: No hyperdense vessel or unexpected calcification. Skull: Normal. Negative for fracture or focal lesion. Sinuses/Orbits: No acute finding. Other: None. CT CERVICAL SPINE FINDINGS Alignment: Within normal limits. Skull base and  vertebrae: 7 cervical segments are well visualized. Osteophytic changes are noted most prominent at C6-7. Mild facet hypertrophic changes are noted similar to that seen on the prior exam. No acute fracture or acute facet abnormality is noted. Soft tissues and spinal canal: Within normal limits. Disc levels: Disc space narrowing and osteophytic changes at C6-7 are noted with mild central canal stenosis. Upper chest: Visualized upper abdomen is within normal limits. Other: None IMPRESSION: CT of the head: No acute abnormality noted. Mild atrophic changes are seen. CT of the cervical spine: Mild degenerative change stable from the previous exam. No acute abnormality noted. Electronically Signed   By: Inez Catalina M.D.   On: 02/17/2017 17:09   Ct Cervical Spine Wo Contrast  Result Date: 02/17/2017 CLINICAL DATA:  Several falls today with headaches and neck pain, initial encounter EXAM: CT HEAD WITHOUT CONTRAST CT CERVICAL SPINE WITHOUT CONTRAST TECHNIQUE: Multidetector CT imaging of the head and cervical spine was performed following the standard protocol without intravenous contrast. Multiplanar CT image reconstructions  of the cervical spine were also generated. COMPARISON:  01/23/2017 FINDINGS: CT HEAD FINDINGS Brain: Mild atrophic changes are seen. No findings to suggest acute hemorrhage, acute infarction or space-occupying mass lesion are noted. Vascular: No hyperdense vessel or unexpected calcification. Skull: Normal. Negative for fracture or focal lesion. Sinuses/Orbits: No acute finding. Other: None. CT CERVICAL SPINE FINDINGS Alignment: Within normal limits. Skull base and vertebrae: 7 cervical segments are well visualized. Osteophytic changes are noted most prominent at C6-7. Mild facet hypertrophic changes are noted similar to that seen on the prior exam. No acute fracture or acute facet abnormality is noted. Soft tissues and spinal canal: Within normal limits. Disc levels: Disc space narrowing and osteophytic changes at C6-7 are noted with mild central canal stenosis. Upper chest: Visualized upper abdomen is within normal limits. Other: None IMPRESSION: CT of the head: No acute abnormality noted. Mild atrophic changes are seen. CT of the cervical spine: Mild degenerative change stable from the previous exam. No acute abnormality noted. Electronically Signed   By: Inez Catalina M.D.   On: 02/17/2017 17:09   Mr Jodene Nam Head Wo Contrast  Result Date: 02/17/2017 CLINICAL DATA:  Initial evaluation for acute ataxia. EXAM: MRI HEAD WITHOUT CONTRAST MRA HEAD WITHOUT CONTRAST TECHNIQUE: Multiplanar, multiecho pulse sequences of the brain and surrounding structures were obtained without intravenous contrast. Angiographic images of the head were obtained using MRA technique without contrast. COMPARISON:  Prior CT from earlier the same day as well as previous MRI from 12/17/2016. FINDINGS: MRI HEAD FINDINGS Brain: Cerebral volume stable, and wound within normal limits for age. Mild nonspecific cerebral white matter changes, stable from previous. No evidence for acute or subacute ischemia. Gray-white matter differentiation maintained.  No evidence for chronic infarction. No evidence for acute or chronic intracranial hemorrhage. No mass lesion, midline shift or mass effect. No hydrocephalus. No extra-axial fluid collection. Major dural sinuses are grossly patent. Pituitary gland suprasellar region normal. Midline structures intact and normal. Vascular: Major intracranial vascular flow voids are maintained. Skull and upper cervical spine: Craniocervical junction normal. Visualized upper cervical spine unremarkable. Bone marrow signal intensity within normal limits. No scalp soft tissue abnormality. Sinuses/Orbits: Globes and orbital soft tissues within normal limits. Paranasal sinuses are clear. No mastoid effusion. Inner ear structures normal. MRA HEAD FINDINGS ANTERIOR CIRCULATION: Distal cervical segments of the internal carotid arteries are patent with antegrade flow. Petrous, cavernous, and supraclinoid segments patent without flow-limiting stenosis. ICA termini widely patent. A1 segments patent.  Anterior communicating artery normal. Anterior cerebral artery is widely patent to their distal aspects. The M1 segments patent without stenosis or occlusion. No proximal M2 occlusion or stenosis. Distal MCA branches well opacified and symmetric. POSTERIOR CIRCULATION: Vertebral arteries patent to the vertebrobasilar junction without stenosis. Posterior inferior cerebral arteries patent bilaterally. Basilar artery widely patent to its distal aspect. Superior cerebral arteries patent bilaterally. Basilar tip ectatic without discrete aneurysm. Posterior cerebral arteries largely supplied via the basilar artery and are widely patent to their distal aspects without stenosis. Small right posterior communicating artery noted. No discrete aneurysm or vascular malformation. IMPRESSION: MRI HEAD IMPRESSION: 1. No acute intracranial process. 2. Stable nonspecific mild cerebral white matter disease. MRA HEAD IMPRESSION: Normal intracranial MRA. Electronically  Signed   By: Jeannine Boga M.D.   On: 02/17/2017 21:27   Mr Brain Wo Contrast  Result Date: 02/17/2017 CLINICAL DATA:  Initial evaluation for acute ataxia. EXAM: MRI HEAD WITHOUT CONTRAST MRA HEAD WITHOUT CONTRAST TECHNIQUE: Multiplanar, multiecho pulse sequences of the brain and surrounding structures were obtained without intravenous contrast. Angiographic images of the head were obtained using MRA technique without contrast. COMPARISON:  Prior CT from earlier the same day as well as previous MRI from 12/17/2016. FINDINGS: MRI HEAD FINDINGS Brain: Cerebral volume stable, and wound within normal limits for age. Mild nonspecific cerebral white matter changes, stable from previous. No evidence for acute or subacute ischemia. Gray-white matter differentiation maintained. No evidence for chronic infarction. No evidence for acute or chronic intracranial hemorrhage. No mass lesion, midline shift or mass effect. No hydrocephalus. No extra-axial fluid collection. Major dural sinuses are grossly patent. Pituitary gland suprasellar region normal. Midline structures intact and normal. Vascular: Major intracranial vascular flow voids are maintained. Skull and upper cervical spine: Craniocervical junction normal. Visualized upper cervical spine unremarkable. Bone marrow signal intensity within normal limits. No scalp soft tissue abnormality. Sinuses/Orbits: Globes and orbital soft tissues within normal limits. Paranasal sinuses are clear. No mastoid effusion. Inner ear structures normal. MRA HEAD FINDINGS ANTERIOR CIRCULATION: Distal cervical segments of the internal carotid arteries are patent with antegrade flow. Petrous, cavernous, and supraclinoid segments patent without flow-limiting stenosis. ICA termini widely patent. A1 segments patent. Anterior communicating artery normal. Anterior cerebral artery is widely patent to their distal aspects. The M1 segments patent without stenosis or occlusion. No proximal M2  occlusion or stenosis. Distal MCA branches well opacified and symmetric. POSTERIOR CIRCULATION: Vertebral arteries patent to the vertebrobasilar junction without stenosis. Posterior inferior cerebral arteries patent bilaterally. Basilar artery widely patent to its distal aspect. Superior cerebral arteries patent bilaterally. Basilar tip ectatic without discrete aneurysm. Posterior cerebral arteries largely supplied via the basilar artery and are widely patent to their distal aspects without stenosis. Small right posterior communicating artery noted. No discrete aneurysm or vascular malformation. IMPRESSION: MRI HEAD IMPRESSION: 1. No acute intracranial process. 2. Stable nonspecific mild cerebral white matter disease. MRA HEAD IMPRESSION: Normal intracranial MRA. Electronically Signed   By: Jeannine Boga M.D.   On: 02/17/2017 21:27      Scheduled Meds: . aspirin  300 mg Rectal Daily   Or  . aspirin  325 mg Oral Daily  . enoxaparin (LOVENOX) injection  40 mg Subcutaneous Q24H  . [START ON 02/19/2017] feeding supplement (ENSURE ENLIVE)  237 mL Oral BID BM  . FLUoxetine  40 mg Oral Daily   Continuous Infusions:   LOS: 0 days    Time spent in minutes: 35    Debbe Odea, MD [facility] Pager:  [email or web address] Password [facility] 02/18/2017, 6:09 PM

## 2017-02-18 NOTE — Evaluation (Signed)
Physical Therapy Evaluation Patient Details Name: Dorothy White MRN: [id number] DOB: 1950/08/17 Today's Date: 02/18/2017   History of Present Illness  66 y.o. female with medical history significant of anxiety depression, chronic migraine, dyslipidemia, prior history of TIA in 2013, smoker and reported quitting about 3 weeks ago, mild LVH, presented with multiple episodes of falls. Workup underway.  Clinical Impression  Orders received for PT evaluation. Patient demonstrates deficits in functional mobility as indicated below. Will benefit from continued skilled PT to address deficits and maximize function. Will see as indicated and progress as tolerated. Patient very concerned regarding safety with mobility and what is causing the issues.  OF NOTE: patient unsteady with ambulation and is at a very HIGH increased risk for recurrence of falls, patient with noted falls preceding admission and continues to demonstrate poor coordination of gait in open areas or during compounding gait tasks.    Follow Up Recommendations Outpatient PT;Supervision for mobility/OOB    Equipment Recommendations  None recommended by PT    Recommendations for Other Services       Precautions / Restrictions Precautions Precautions: Fall Restrictions Weight Bearing Restrictions: No      Mobility  Bed Mobility                  Transfers Overall transfer level: Needs assistance Equipment used: None Transfers: Sit to/from Stand Sit to Stand: Supervision         General transfer comment: no physical assist, one noted LOB posteriorly during stand from bed, calves braced against bed during transition. Able to power up from [facility] without difficulty  Ambulation/Gait Ambulation/Gait assistance: Min assist;Min guard Ambulation Distance (Feet): 240 Feet (120 with min assist no device, 120 with RW) Assistive device: 1 person hand held assist;Rolling walker (2 wheeled) Gait Pattern/deviations:  Step-through pattern;Decreased step length - right;Narrow base of support;Staggering right Gait velocity: decreased Gait velocity interpretation: Below normal speed for age/gender General Gait Details: patient with noted instability during ambulation, decreased stride and clearance of RLE at times. Multiple balance checks improved with use of RW during second bout of ambulation. At times patient with short shuffling gait pattern  Stairs            Wheelchair Mobility    Modified Rankin (Stroke Patients Only)       Balance Overall balance assessment: History of Falls (noted 4 falls in previous day)                                           Pertinent Vitals/Pain Pain Assessment: No/denies pain    Home Living Family/patient expects to be discharged to:: Private residence Living Arrangements: Non-relatives/Friends Available Help at Discharge: Friend(s);Available PRN/intermittently Type of Home: House Home Access: Level entry     Home Layout: Multi-level;Able to live on main level with bedroom/bathroom (Patient lives on 1st floor and friend lives on 2nd floor) Home Equipment: Kasandra Knudsen - single point      Prior Function Level of Independence: Independent         Comments: reports use of cane when unsteady, patient reports that she falls alot     Hand Dominance   Dominant Hand: Right    Extremity/Trunk Assessment   Upper Extremity Assessment Upper Extremity Assessment: Overall WFL for tasks assessed    Lower Extremity Assessment Lower Extremity Assessment: Overall WFL for tasks assessed;RLE deficits/detail;LLE deficits/detail RLE Deficits / Details:  modest assymetrical weakness RLE 4+/5 compared to 5/5 RLE Coordination: decreased gross motor (during functional ambulation ) LLE Sensation: decreased light touch (per reports patient states diminished sensation )       Communication   Communication: No difficulties  Cognition Arousal/Alertness:  Awake/alert Behavior During Therapy: WFL for tasks assessed/performed Overall Cognitive Status: No family/caregiver present to determine baseline cognitive functioning                                        General Comments      Exercises     Assessment/Plan    PT Assessment Patient needs continued PT services  PT Problem List Decreased strength;Decreased activity tolerance;Decreased balance;Decreased mobility;Decreased coordination;Impaired sensation       PT Treatment Interventions DME instruction;Gait training;Stair training;Functional mobility training;Therapeutic activities;Therapeutic exercise;Balance training;Neuromuscular re-education;Patient/family education    PT Goals (Current goals can be found in the Care Plan section)  Acute Rehab PT Goals Patient Stated Goal: to find otu what is causing this PT Goal Formulation: With patient Time For Goal Achievement: 03/04/17 Potential to Achieve Goals: Good    Frequency Min 3X/week   Barriers to discharge Decreased caregiver support      Co-evaluation               AM-PAC PT "6 Clicks" Daily Activity  Outcome Measure Difficulty turning over in bed (including adjusting bedclothes, sheets and blankets)?: A Little Difficulty moving from lying on back to sitting on the side of the bed? : A Little Difficulty sitting down on and standing up from a chair with arms (e.g., wheelchair, bedside commode, etc,.)?: A Little Help needed moving to and from a bed to chair (including a wheelchair)?: A Little Help needed walking in hospital room?: A Little Help needed climbing 3-5 steps with a railing? : A Little 6 Click Score: 18    End of Session Equipment Utilized During Treatment: Gait belt Activity Tolerance: Patient tolerated treatment well Patient left: in chair;with call bell/phone within reach;with chair alarm set Nurse Communication: Mobility status PT Visit Diagnosis: Unsteadiness on feet  (R26.81);Ataxic gait (R26.0);Difficulty in walking, not elsewhere classified (R26.2)    Time: 5038-8828 PT Time Calculation (min) (ACUTE ONLY): 22 min   Charges:   PT Evaluation $PT Eval Moderate Complexity: 1 Procedure     PT G Codes:   PT G-Codes **NOT FOR INPATIENT CLASS** Functional Assessment Tool Used: Clinical judgement Functional Limitation: Mobility: Walking and moving around Mobility: Walking and Moving Around Current Status (M0349): At least 20 percent but less than 40 percent impaired, limited or restricted Mobility: Walking and Moving Around Goal Status [phone]): 0 percent impaired, limited or restricted    Alben Deeds, PT DPT  Board Certified Neurologic Specialist [place] 02/18/2017, 2:14 PM

## 2017-02-18 NOTE — Progress Notes (Signed)
Pt ambulated in hallway. Gait slow and steady. No noted distress. Will continue to monitor.

## 2017-02-18 NOTE — Progress Notes (Signed)
  Echocardiogram 2D Echocardiogram has been performed.  Dorothy White 02/18/2017, 12:52 PM

## 2017-02-19 DIAGNOSIS — Z8673 Personal history of transient ischemic attack (TIA), and cerebral infarction without residual deficits: Secondary | ICD-10-CM

## 2017-02-19 DIAGNOSIS — G43009 Migraine without aura, not intractable, without status migrainosus: Secondary | ICD-10-CM | POA: Diagnosis not present

## 2017-02-19 DIAGNOSIS — K5732 Diverticulitis of large intestine without perforation or abscess without bleeding: Secondary | ICD-10-CM | POA: Diagnosis not present

## 2017-02-19 DIAGNOSIS — G6289 Other specified polyneuropathies: Secondary | ICD-10-CM

## 2017-02-19 DIAGNOSIS — W19XXXA Unspecified fall, initial encounter: Secondary | ICD-10-CM | POA: Diagnosis not present

## 2017-02-19 DIAGNOSIS — W19XXXS Unspecified fall, sequela: Secondary | ICD-10-CM | POA: Diagnosis not present

## 2017-02-19 DIAGNOSIS — I6522 Occlusion and stenosis of left carotid artery: Secondary | ICD-10-CM

## 2017-02-19 LAB — VAS US CAROTID
LCCAPDIAS: 23 cm/s
LCCAPSYS: 110 cm/s
LEFT ECA DIAS: -15 cm/s
LEFT VERTEBRAL DIAS: 15 cm/s
LICADDIAS: -49 cm/s
LICADSYS: -147 cm/s
LICAPDIAS: -27 cm/s
Left ICA prox sys: -88 cm/s
RCCAPSYS: 98 cm/s
RIGHT ECA DIAS: -13 cm/s
RIGHT VERTEBRAL DIAS: -14 cm/s
Right CCA prox dias: 21 cm/s
Right cca dist sys: -111 cm/s

## 2017-02-19 LAB — HEMOGLOBIN A1C
HEMOGLOBIN A1C: 4.8 % (ref 4.8–5.6)
MEAN PLASMA GLUCOSE: 91 mg/dL

## 2017-02-19 LAB — HIV ANTIBODY (ROUTINE TESTING W REFLEX): HIV SCREEN 4TH GENERATION: NONREACTIVE

## 2017-02-19 MED ORDER — VITAMIN B-12 1000 MCG PO TABS: 1000.0000 ug | ORAL_TABLET | Freq: Every day | ORAL | 0 refills | Status: DC

## 2017-02-19 MED ORDER — ENSURE ENLIVE PO LIQD: 237.0000 mL | Freq: Two times a day (BID) | ORAL | 12 refills | Status: DC

## 2017-02-19 MED ORDER — CYANOCOBALAMIN 1000 MCG/ML IJ SOLN
1000.0000 ug | Freq: Once | INTRAMUSCULAR | Status: AC
Start: 2017-02-19 — End: 2017-02-19
  Administered 2017-02-19: 1000 ug via SUBCUTANEOUS
  Filled 2017-02-19: qty 1

## 2017-02-19 MED ORDER — ASPIRIN EC 81 MG PO TBEC: 81.0000 mg | DELAYED_RELEASE_TABLET | Freq: Every day | ORAL | 2 refills | Status: DC

## 2017-02-19 MED ORDER — TOPIRAMATE 25 MG PO CPSP: 25.0000 mg | ORAL_CAPSULE | Freq: Every day | ORAL | 1 refills | Status: DC

## 2017-02-19 NOTE — Progress Notes (Addendum)
[facility]  Patient examined. Discharge plan including medication changes and follow up discussed with patient in detail. Please see d/c summary from 7/25. Thank you.

## 2017-02-19 NOTE — Progress Notes (Signed)
Neurology progress note  Subjective: Seen and examined this morning. Offers no new complaints.  Exam: Vitals:   02/19/17 0525 02/19/17 0820  BP: (!) 94/53 (!) 117/45  Pulse: 69 78  Resp: 18 18  Temp: 98.5 F (36.9 C) 97.8 F (36.6 C)   Neurologic Examination:  Mental Status:  Alert, oriented, thought content appropriate. Speech without evidence of dysarthria or aphasia. Able to follow 3 step commands without difficulty.  Cranial Nerves:  II-bilateral visual fields intact III/IV/VI-Pupils were equal and reacted. Extraocular movements were full.  V/VII-no facial numbness and no facial weakness. Slightly decreased sensation right face to light touch. VIII-hearing normal.  X-normal speech and symmetrical palatal movement.  XII-midline tongue extension  Motor: 5/5 strength symmetrical throughout except right lower extremity 4/5. Sensory: decreased sensation to LT and temp in a stocking type pattern. Impaired joint position sense on both feet.  Romberg+ve Deep Tendon Reflexes: 2+/4 throughout Plantars: mute bilaterally Cerebellar: Normal finger to nose  Gait: cautious, Romberg +ve  Pertinent Labs: Vitamin B12 284 TSH WNL Folate WNL  Imaging reviewed MRI brain and MRA of the head and neck with no acute findings.  Assessment/Plan: 66 year old woman with a past history of hypertension, hyperlipidemia, migraines and nonischemic myopathy, anxiety and depression came in for evaluation of frequent falls. Her examination was significant for stocking and pattern of sensory loss with preserved deep tendon reflexes. There is concern for peripheral neuropathy. Laboratory findings show low normal vitamin B12 (I would like to see levels upwards of 400 - 450), normal folate and TSH. She also complains of ongoing migraines. She is not on a preventative therapy for migraines. I think her frequent falls are a combination of her peripheral neuropathy which is untreated, low back pain as well as  possibly side effect of multiple sedating medications that she is on. She also would benefit from being on prophylactic treatment for migraines. She could also be orthostatic, given her HR went up from 61 to 89 although SBP and DBP did not fall significantly.  Impression -Peripheral Neuropathy -Migraine -Possible orthostatic hypotension -Chronic low backpain  Recs: FALLS -Minimize pain and sedating medications -Replete B12, would recommend parentral supplementation (50 - 100 mcg per week till levels come to upwards of 450) and then continue with oral supplementation as well as monthly parenteral supplementation. -Also check A1c. -Outpatient neurology follow up with EMG/NCS of the lower extremities.  MIGRAINES -Topamax 25 QHS for 1 week followed 25mg  BID for 1 week followed by 25 mg qAM and 50 mg qHS for 1 week followed by 50 mg BID. Total goal of 100mg  daily. Patient should be advised to keep well hydrated to minimize risk of renal calculi. -Outpatient neurology follow up.  Please call with questions.  Amie Portland, MD [facility] [phone]  If 7pm to 7am, please call on call as listed on AMION.

## 2017-02-19 NOTE — Progress Notes (Signed)
Physical Therapy Treatment Patient Details Name: Dorothy White MRN: [id number] DOB: Mar 20, 1951 Today's Date: 02/19/2017    History of Present Illness 66 y.o. female with medical history significant of anxiety depression, chronic migraine, dyslipidemia, prior history of TIA in 2013, smoker and reported quitting about 3 weeks ago, mild LVH, presented with multiple episodes of falls. Workup underway.    PT Comments    Pt seen for mobility progression and making good progress towards achieving her current functional goals. Pt ambulated with less assistance this session, with and without AD. Pt would continue to benefit from skilled physical therapy services at this time while admitted and after d/c to address the below listed limitations in order to improve overall safety and independence with functional mobility.    Follow Up Recommendations  Outpatient PT;Supervision for mobility/OOB     Equipment Recommendations  None recommended by PT    Recommendations for Other Services       Precautions / Restrictions Precautions Precautions: Fall Restrictions Weight Bearing Restrictions: No    Mobility  Bed Mobility Overal bed mobility: Modified Independent                Transfers Overall transfer level: Needs assistance Equipment used: None Transfers: Sit to/from Stand Sit to Stand: Supervision         General transfer comment: supervision for safety  Ambulation/Gait Ambulation/Gait assistance: Supervision Ambulation Distance (Feet): 200 Feet Assistive device: Rolling walker (2 wheeled);None Gait Pattern/deviations: Step-through pattern;Narrow base of support;Decreased stride length Gait velocity: decreased Gait velocity interpretation: Below normal speed for age/gender General Gait Details: no instability or LOB, pt ambulated in hallway with use of RW and without an AD in her room, supervision for safety   Stairs            Wheelchair Mobility     Modified Rankin (Stroke Patients Only)       Balance Overall balance assessment: Needs assistance;History of Falls Sitting-balance support: Feet supported Sitting balance-Leahy Scale: Good     Standing balance support: During functional activity;No upper extremity supported Standing balance-Leahy Scale: Fair                              Cognition Arousal/Alertness: Awake/alert Behavior During Therapy: WFL for tasks assessed/performed Overall Cognitive Status: Within Functional Limits for tasks assessed                                        Exercises      General Comments        Pertinent Vitals/Pain Pain Assessment: No/denies pain    Home Living                      Prior Function            PT Goals (current goals can now be found in the care plan section) Acute Rehab PT Goals PT Goal Formulation: With patient Time For Goal Achievement: 03/04/17 Potential to Achieve Goals: Good Progress towards PT goals: Progressing toward goals    Frequency    Min 3X/week      PT Plan Current plan remains appropriate    Co-evaluation              AM-PAC PT "6 Clicks" Daily Activity  Outcome Measure  Difficulty turning over in bed (including adjusting bedclothes, sheets  and blankets)?: None Difficulty moving from lying on back to sitting on the side of the bed? : None Difficulty sitting down on and standing up from a chair with arms (e.g., wheelchair, bedside commode, etc,.)?: None Help needed moving to and from a bed to chair (including a wheelchair)?: None Help needed walking in hospital room?: A Little Help needed climbing 3-5 steps with a railing? : A Little 6 Click Score: 22    End of Session   Activity Tolerance: Patient tolerated treatment well Patient left: in bed;with call bell/phone within reach Nurse Communication: Mobility status PT Visit Diagnosis: Unsteadiness on feet (R26.81);Ataxic gait  (R26.0);Difficulty in walking, not elsewhere classified (R26.2)     Time: 7681-1572 PT Time Calculation (min) (ACUTE ONLY): 12 min  Charges:  $Gait Training: 8-22 mins                    G Codes:       Dorothy White, Dorothy White, Dorothy White Dorothy White 02/19/2017, 1:29 PM

## 2017-02-19 NOTE — Care Management Note (Signed)
Case Management Note  Patient Details  Name: Dorothy White MRN: [id number] Date of Birth: October 16, 1950  Subjective/Objective:                    Action/Plan: Pt discharging home with self care. Pt with orders for rolling walker. Santiago Glad with [facility] DME notified and will deliver the DME to the room. CM consulted for outpatient therapy. CM met with the patient and she was interested in going to rehab at [facility]. Orders in EPIC and information on the AVS.  Pt states she has transportation home.   Expected Discharge Date:  02/19/17               Expected Discharge Plan:  OP Rehab  In-House Referral:     Discharge planning Services  CM Consult  Post Acute Care Choice:  Durable Medical Equipment Choice offered to:  Patient  DME Arranged:  [facility] Rile rolling DME Agency:  [place]:    [place]:     Status of Service:  Completed, signed off  If discussed at [place] of Stay Meetings, dates discussed:    Additional Comments:  Pollie Friar, RN 02/19/2017, 12:25 PM

## 2017-02-19 NOTE — Progress Notes (Signed)
Pt discharging at this time with husband taking all personal belongings. IV discontinued, dry dressing applied. Discharge instructions along with prescription provided with verbal understanding. Pt will follow up with Md per dc summary. No noted distress.

## 2017-02-19 NOTE — Discharge Instructions (Signed)
Strict low cholesterol diet.   Limit Narcotics and Xanax use if dizzy and falling.   Please take all your medications with you for your next visit with your Primary MD. Please request your Primary MD to go over all hospital test results at the follow up. Please ask your Primary MD to get all Hospital records sent to his/her office.  If you experience worsening of your admission symptoms, develop shortness of breath, chest pain, suicidal or homicidal thoughts or a life threatening emergency, you must seek medical attention immediately by calling 911 or calling your MD.  Dorothy White must read the complete instructions/literature along with all the possible adverse reactions/side effects for all the medicines you take including new medications that have been prescribed to you. Take new medicines after you have completely understood and accpet all the possible adverse reactions/side effects.   Do not drive when taking pain medications or sedatives.    Do not take more than prescribed Pain, Sleep and Anxiety Medications  If you have smoked or chewed Tobacco in the last 2 yrs please stop. Stop any regular alcohol and or recreational drug use.  Wear Seat belts while driving.

## 2017-02-20 ENCOUNTER — Other Ambulatory Visit: Payer: Self-pay

## 2017-02-20 DIAGNOSIS — F1120 Opioid dependence, uncomplicated: Secondary | ICD-10-CM | POA: Diagnosis not present

## 2017-02-20 DIAGNOSIS — F1320 Sedative, hypnotic or anxiolytic dependence, uncomplicated: Secondary | ICD-10-CM | POA: Diagnosis not present

## 2017-02-20 LAB — HEMOGLOBIN A1C
HEMOGLOBIN A1C: 4.8 % (ref 4.8–5.6)
MEAN PLASMA GLUCOSE: 91 mg/dL

## 2017-02-20 NOTE — Patient Outreach (Signed)
[facility]) Care Management  02/20/2017  LAMICA MCCART 12-26-1950 [id number]   3rd telephone call to patient for ED Utilization Screening.  No answer.  HIPAA compliant voice message left.   Plan: RN Health Coach will send letter to attempt outreach. If no response after ten business days will proceed with case closure.    Jone Baseman, RN, MSN [place] [phone]

## 2017-02-26 ENCOUNTER — Ambulatory Visit: Payer: PPO | Admitting: Physical Therapy

## 2017-02-26 ENCOUNTER — Encounter: Payer: Self-pay | Admitting: Neurology

## 2017-02-26 ENCOUNTER — Ambulatory Visit (INDEPENDENT_AMBULATORY_CARE_PROVIDER_SITE_OTHER): Payer: PPO | Admitting: Neurology

## 2017-02-26 DIAGNOSIS — F1320 Sedative, hypnotic or anxiolytic dependence, uncomplicated: Secondary | ICD-10-CM | POA: Diagnosis not present

## 2017-02-26 DIAGNOSIS — G43409 Hemiplegic migraine, not intractable, without status migrainosus: Secondary | ICD-10-CM

## 2017-02-26 DIAGNOSIS — G43419 Hemiplegic migraine, intractable, without status migrainosus: Secondary | ICD-10-CM

## 2017-02-26 DIAGNOSIS — F1120 Opioid dependence, uncomplicated: Secondary | ICD-10-CM | POA: Diagnosis not present

## 2017-02-26 HISTORY — DX: Hemiplegic migraine, not intractable, without status migrainosus: G43.409

## 2017-02-26 NOTE — Patient Instructions (Signed)
   Start Topamax for the migraine headache.    Topamax (topiramate) is a seizure medication that has an FDA approval for seizures and for migraine headache. Potential side effects of this medication include weight loss, cognitive slowing, tingling in the fingers and toes, and carbonated drinks will taste bad. If any significant side effects are noted on this drug, please contact our office.

## 2017-02-26 NOTE — Progress Notes (Signed)
Reason for visit: Migraine headache  Referring physician: [facility]  Dorothy White is a 66 y.o. female  History of present illness:  Dorothy White is a 66 year old right-handed white female with a history of migraine headaches since around age 74. The patient over the last 3 years has had hemiplegic features with her migraine. The patient will have episodes of feeling dizzy and have weakness of the lower extremities, and difficulty with walking. She will have numbness on the arms and legs that may migrate about. The patient may have photophobia and phonophobia and occasional nausea with her headache. She may get left temporal headaches that may come and go without hemiplegic features. She has some troubles with imbalance at all times with walking. The patient averages about 2 headaches a week, but the hemiplegic type of headaches are less frequent. The past she has been on amitriptyline, but she is no longer on this medication currently. The patient denies any issues controlling the bowels or the bladder. The patient was placed on Topamax when she was in the hospital recently on 02/17/2017 with leg weakness and difficulty walking. The patient has not taken the Topamax yet. During that hospitalization, she underwent MRI of the brain that was unremarkable, MRA of the head was normal, and a carotid Doppler study showed some nonsurgical stenosis of the left carotid artery. The patient recently stopped smoking. A 2-D echocardiogram study was unremarkable. The patient indicates that stress and hot weather may bring on her headaches. She indicates that her mother also has a history of headache. She is sent to this office for an evaluation.  Past Medical History:  Diagnosis Date  . Anemia    ?  Marland Kitchen Anxiety   . Arthritis    "everywhere" (04/25/2016)  . 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 Chronic back pain    "all over" (04/25/2016)  . Constipation   . Depression   . Diverticulitis   .  GERD (gastroesophageal reflux disease)   . Headache    "weekly" (04/25/2016)  . HLD (hyperlipidemia)    hx (04/25/2016)  . 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 Mild CAD    a. 25% mLAD, otherwise no sig disease 01/2016 cath.  Marland Kitchen NICM (nonischemic cardiomyopathy) ([place])    a. EF 40-45% by echo 01/3219 at time of complicated migraine/neuro sx, 55-65% at time of cath 01/2016  . Stroke [facility]) 06/2013   "mini" stroke , ?possibly hemaplegic migraine per pt    Past Surgical History:  Procedure Laterality Date  . AUGMENTATION MAMMAPLASTY  1980  . BASAL CELL CARCINOMA EXCISION     "tip of my nose"  . BUNIONECTOMY Bilateral 10/2003  . CARDIAC CATHETERIZATION N/A 02/20/2016   Procedure: Left Heart Cath and Coronary Angiography;  Surgeon: Jettie Booze, MD;  Location: [facility];  Service: Cardiovascular;  Laterality: N/A;  . COLONOSCOPY    . DILATION AND CURETTAGE OF UTERUS    . FRACTURE SURGERY    . IR GENERIC HISTORICAL  03/18/2016   IR SINUS/FIST TUBE CHK-NON GI 03/18/2016 Aletta Edouard, MD [facility]-INTERV RAD  . LAPAROSCOPIC SIGMOID COLECTOMY N/A 04/25/2016   Procedure: LAPAROSCOPIC SIGMOID COLECTOMY;  Surgeon: Stark Klein, MD;  Location: [facility];  Service: General;  Laterality: N/A;  . OOPHORECTOMY Bilateral ~ 1999  . ORIF HUMERUS FRACTURE Right 02/15/2015   Procedure: OPEN REDUCTION INTERNAL FIXATION (ORIF) PROXIMAL HUMERUS FRACTURE;  Surgeon: Netta Cedars,  MD;  Location: [facility];  Service: Orthopedics;  Laterality: Right;  . RHINOPLASTY  1976  . TONSILLECTOMY    . TUBAL LIGATION  ~ 1983  . VAGINAL HYSTERECTOMY  ~ 1998    Family History  Problem Relation Age of Onset  . Lung cancer Mother 29  . Heart attack Father        Vague  . Hypertension Brother   . Esophageal cancer Brother   . Diabetes Paternal Grandmother        questionable  . Colon cancer Neg Hx   . Kidney disease Neg Hx   . Liver disease Neg Hx     Social  history:  reports that she has been smoking Cigarettes.  She has smoked for the past 34.00 years. She has never used smokeless tobacco. She reports that she does not drink alcohol or use drugs.  Medications:  Prior to Admission medications   Medication Sig Start Date End Date Taking? Authorizing Provider  aspirin EC 81 MG tablet Take 1 tablet (81 mg total) by mouth daily. 02/19/17 02/19/18 Yes Debbe Odea, MD  clonazePAM (KLONOPIN) 1 MG tablet Take 1 tablet (1 mg total) by mouth daily as needed (anxiety/sleep.). 02/15/16  Yes Dorothy Spark, MD  feeding supplement, ENSURE ENLIVE, (ENSURE ENLIVE) LIQD Take 237 mLs by mouth 2 (two) times daily between meals. 02/19/17  Yes Debbe Odea, MD  FLUoxetine (PROZAC) 20 MG capsule Take 2 capsules (40 mg total) by mouth daily. 09/27/16  Yes Rai, Ripudeep K, MD  LUTEIN PO Take 1 capsule by mouth every evening.   Yes [provider]  mometasone (NASONEX) 50 MCG/ACT nasal spray Place 2 sprays into the nose daily.   Yes [provider]  ondansetron (ZOFRAN) 8 MG tablet Take 8 mg by mouth every 8 (eight) hours as needed for nausea or vomiting.   Yes [provider]  RELPAX 40 MG tablet Take 40 mg by mouth every 2 (two) hours as needed. Take 1 tablet by mouth as needed for migraine and may take 1 additional tablet in 2 hours if no relief  01/16/16  Yes [provider]  topiramate (TOPAMAX) 25 MG capsule Take 1 capsule (25 mg total) by mouth at bedtime. Take for 1 wk.  Increase to 50 mg QHS on week 2, 75 mg QHS on week 3, 100 mg QHS on week 4 and onward 02/19/17  Yes Rizwan, [name], MD  traZODone (DESYREL) 100 MG tablet Take 200 mg by mouth at bedtime as needed for sleep. 11/12/16  Yes [provider]  vitamin B-12 (CYANOCOBALAMIN) 1000 MCG tablet Take 1 tablet (1,000 mcg total) by mouth daily. 02/19/17  Yes Debbe Odea, MD  vitamin E 100 UNIT capsule Take 100 Units by mouth every evening.   Yes [provider]       Allergies  Allergen Reactions  . Penicillins Hives    Has patient had a PCN reaction causing immediate rash, facial/tongue/throat swelling, SOB or lightheadedness with hypotension: Yes Has patient had a PCN reaction causing severe rash involving mucus membranes or skin necrosis: No Has patient had a PCN reaction that required hospitalization No Has patient had a PCN reaction occurring within the last 10 years: No If all of the above answers are "NO", then may proceed with Cephalosporin use.     ROS:  Out of a complete 14 system review of symptoms, the patient complains only of the following symptoms, and all other reviewed systems are negative.  Headache  Weakness, numbness  Blood pressure 125/78, pulse 71, height 5\' 5"  (1.651 m), weight 129 lb 8 oz (58.7 kg).  Physical Exam  General: The patient is alert and cooperative at the time of the examination.  Eyes: Pupils are equal, round, and reactive to light. Discs are flat bilaterally.  Neck: The neck is supple, no carotid bruits are noted.  Respiratory: The respiratory examination is clear.  Cardiovascular: The cardiovascular examination reveals a regular rate and rhythm, no obvious murmurs or rubs are noted.  Skin: Extremities are without significant edema.  Neurologic Exam  Mental status: The patient is alert and oriented x 3 at the time of the examination. The patient has apparent normal recent and remote memory, with an apparently normal attention span and concentration ability.  Cranial nerves: Facial symmetry is present. There is good sensation of the face to pinprick and soft touch on the left, decreased on the right. The strength of the facial muscles and the muscles to head turning and shoulder shrug are normal bilaterally. Speech is well enunciated, no aphasia or dysarthria is noted. Extraocular movements are full. Visual fields are full. The tongue is midline, and the patient has symmetric elevation of the soft  palate. No obvious hearing deficits are noted.  Motor: The motor testing reveals 5 over 5 strength of all 4 extremities. Good symmetric motor tone is noted throughout.  Sensory: Sensory testing is intact to pinprick, soft touch, vibration sensation, and position sense on all 4 extremities, with exception of some decreased pinprick sensation on the right arm. No evidence of extinction is noted.  Coordination: Cerebellar testing reveals good finger-nose-finger and heel-to-shin bilaterally.  Gait and station: Gait is normal. Tandem gait is slightly unsteady. Romberg is negative. No drift is seen.  Reflexes: Deep tendon reflexes are symmetric and normal bilaterally. The ankle jerk reflexes are well-maintained. Toes are downgoing bilaterally.   MRI brain and MRA head 02/17/17:  IMPRESSION: MRI HEAD IMPRESSION:  1. No acute intracranial process. 2. Stable nonspecific mild cerebral white matter disease. MRA HEAD IMPRESSION:  Normal intracranial MRA.  * MRI scan images were reviewed online. I agree with the written report.   2D echo 02/18/17:  Study Conclusions  - Left ventricle: The cavity size was normal. Systolic function was   normal. The estimated ejection fraction was in the range of 60%   to 65%. Wall motion was normal; there were no regional wall   motion abnormalities. There was an increased relative   contribution of atrial contraction to ventricular filling.   Doppler parameters are consistent with abnormal left ventricular   relaxation (grade 1 diastolic dysfunction). - Aortic valve: Poorly visualized. - Right atrium: The atrium was mildly dilated. - Tricuspid valve: There was trivial regurgitation. - Pulmonic valve: Poorly visualized. - Pulmonary arteries: PA peak pressure: 31 mm Hg (S).   Carotid doppler 02/18/17:  Vascular Ultrasound Carotid Duplex (Doppler) has been completed.  Preliminary findings: Right internal carotid artery demonstrates a 1-39% stenosis,  the left distal internal carotid artery demonstrates a 40-59% stenosis. Bilateral vertebral arteries appears patent with antegrade flow.  Assessment/Plan:  1. Migraine headache with hemiparesis features  The patient is instructed to start the Topamax. She will follow-up in about 3 months. She is on Relpax for her headaches, I indicated that this is contraindicated in individuals with hemiplegic migraine. The patient will remain on low-dose aspirin. She will call for any dose adjustments of the Topamax. She may take Advil if needed for the headache.  Jill Alexanders MD 02/26/2017 10:29 AM  [facility] [address] [place], [place] [address]  Phone [phone] Fax [phone]

## 2017-02-27 DIAGNOSIS — F1120 Opioid dependence, uncomplicated: Secondary | ICD-10-CM | POA: Diagnosis not present

## 2017-02-27 DIAGNOSIS — F1320 Sedative, hypnotic or anxiolytic dependence, uncomplicated: Secondary | ICD-10-CM | POA: Diagnosis not present

## 2017-03-02 DIAGNOSIS — E538 Deficiency of other specified B group vitamins: Secondary | ICD-10-CM | POA: Diagnosis not present

## 2017-03-02 DIAGNOSIS — F1320 Sedative, hypnotic or anxiolytic dependence, uncomplicated: Secondary | ICD-10-CM | POA: Diagnosis not present

## 2017-03-02 DIAGNOSIS — F1120 Opioid dependence, uncomplicated: Secondary | ICD-10-CM | POA: Diagnosis not present

## 2017-03-02 DIAGNOSIS — Z5181 Encounter for therapeutic drug level monitoring: Secondary | ICD-10-CM | POA: Diagnosis not present

## 2017-03-02 DIAGNOSIS — F1020 Alcohol dependence, uncomplicated: Secondary | ICD-10-CM | POA: Diagnosis not present

## 2017-03-02 DIAGNOSIS — G8929 Other chronic pain: Secondary | ICD-10-CM | POA: Diagnosis not present

## 2017-03-02 DIAGNOSIS — G43909 Migraine, unspecified, not intractable, without status migrainosus: Secondary | ICD-10-CM | POA: Diagnosis not present

## 2017-03-03 DIAGNOSIS — F1320 Sedative, hypnotic or anxiolytic dependence, uncomplicated: Secondary | ICD-10-CM | POA: Diagnosis not present

## 2017-03-03 DIAGNOSIS — F1120 Opioid dependence, uncomplicated: Secondary | ICD-10-CM | POA: Diagnosis not present

## 2017-03-04 ENCOUNTER — Ambulatory Visit: Payer: PPO

## 2017-03-04 DIAGNOSIS — F1120 Opioid dependence, uncomplicated: Secondary | ICD-10-CM | POA: Diagnosis not present

## 2017-03-04 DIAGNOSIS — F1320 Sedative, hypnotic or anxiolytic dependence, uncomplicated: Secondary | ICD-10-CM | POA: Diagnosis not present

## 2017-03-04 IMAGING — CT CT HEAD W/O CM
2 series · 17 of 30 positions shown, 20 images · non-contrast
Comparison: MRI brain dated 09/28/2014

CLINICAL DATA: Fall, memory loss

EXAM:
CT HEAD WITHOUT CONTRAST
TECHNIQUE: Contiguous axial images were obtained from the base of the skull
through the vertex without intravenous contrast.

[Series 2: head w/o · axial · non-contrast · 0.42mm/px · z∈[+1240,+1360]mm · 9 of 31 slices shown, 12 images]
[im 4/31  brain]
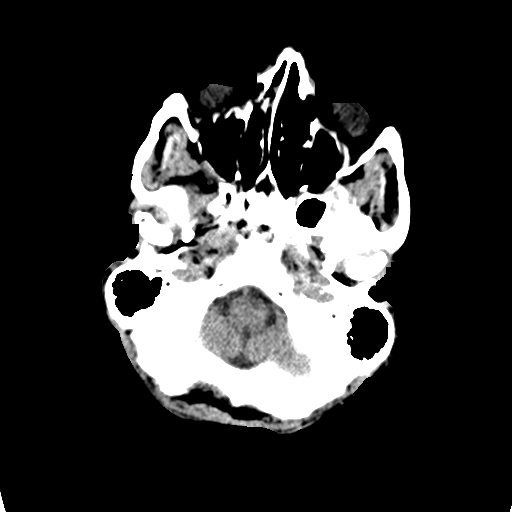
[im 4/31  bone]
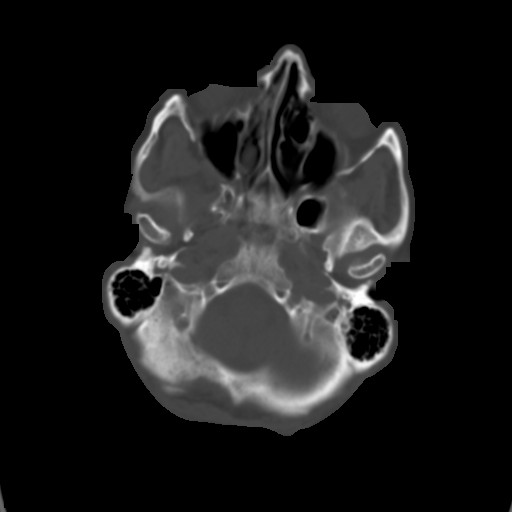
[im 7/31  brain]
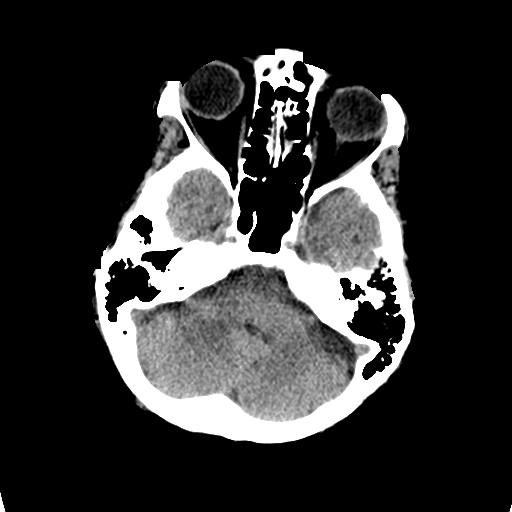
[im 10/31  brain]
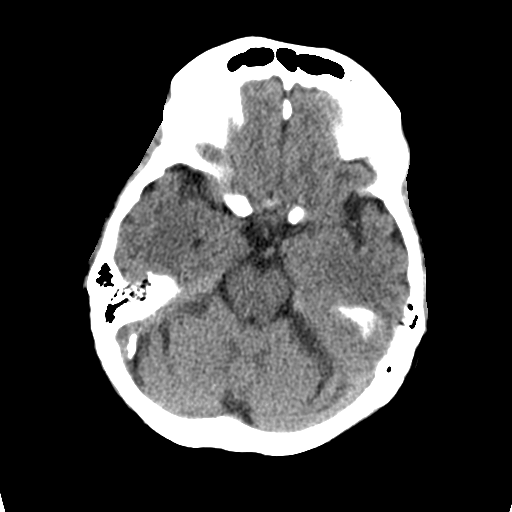
[im 13/31  brain]
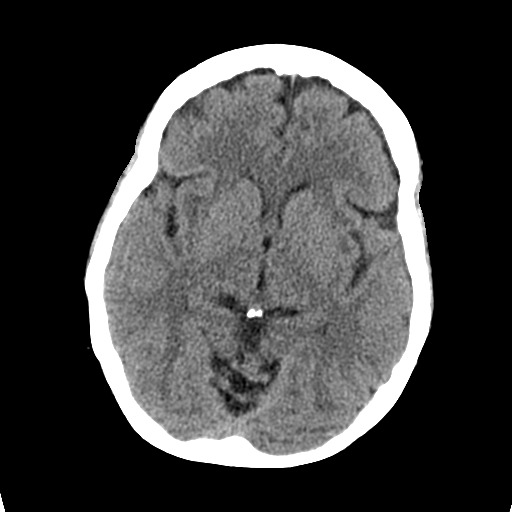
[im 16/31  brain]
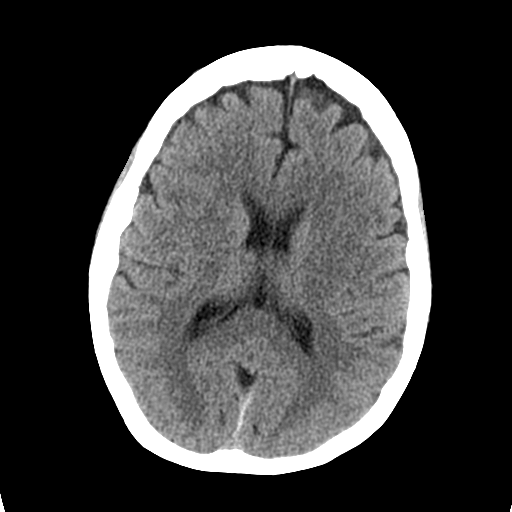
[im 16/31  bone]
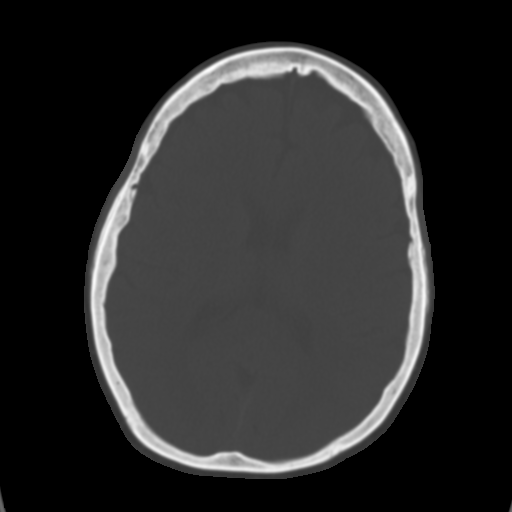
[im 19/31  brain]
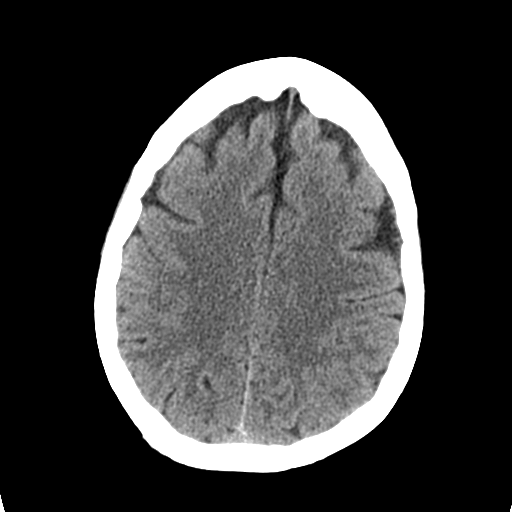
[im 22/31  brain]
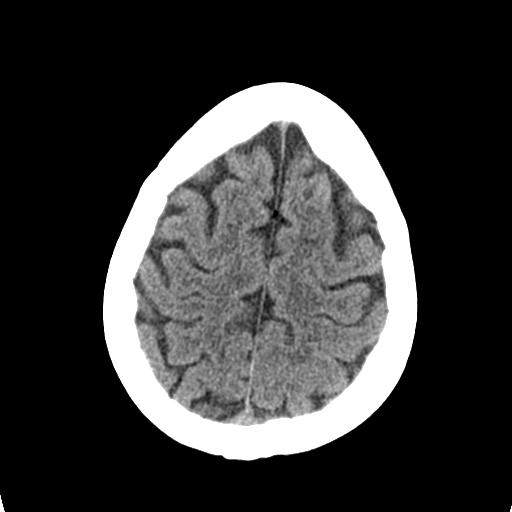
[im 25/31  brain]
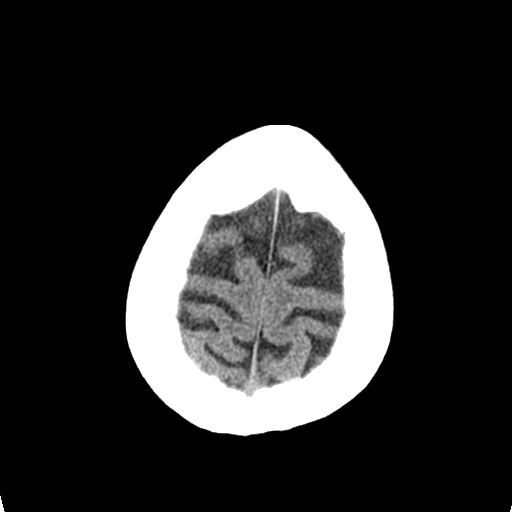
[im 28/31  brain]
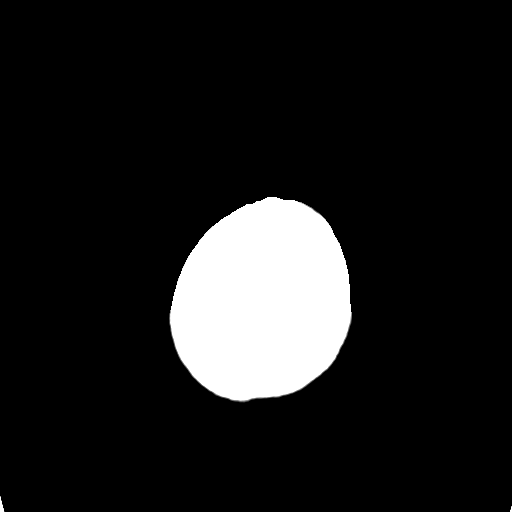
[im 28/31  bone]
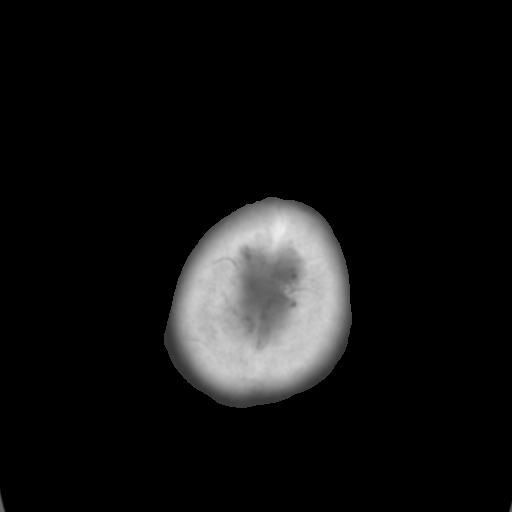

[Series 3: bone windows · axial · 0.42mm/px · z∈[+1240,+1357]mm · 8 of 51 slices shown]
[im 6/51  bone]
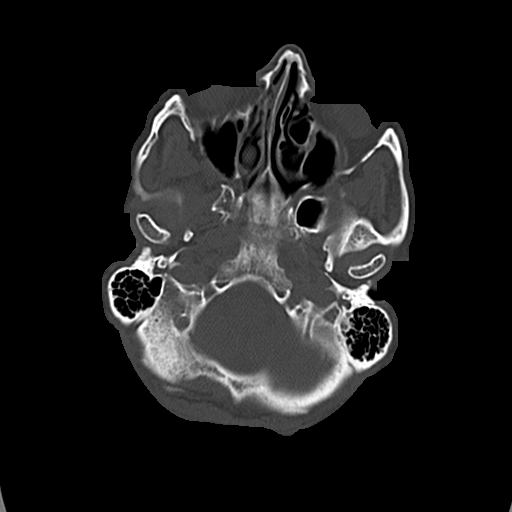
[im 12/51  bone]
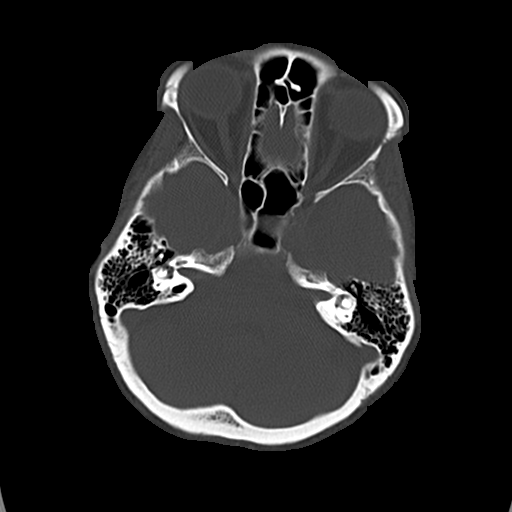
[im 17/51  bone]
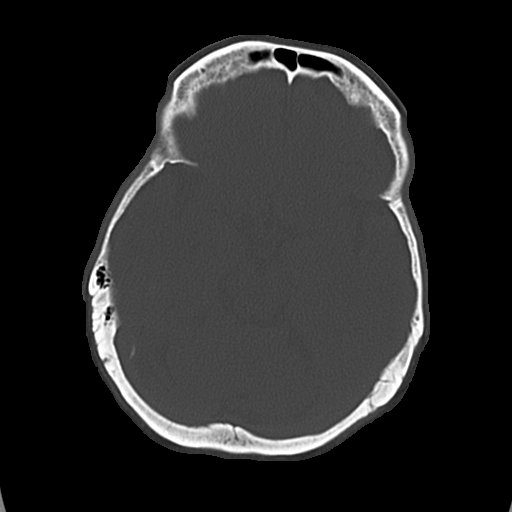
[im 23/51  bone]
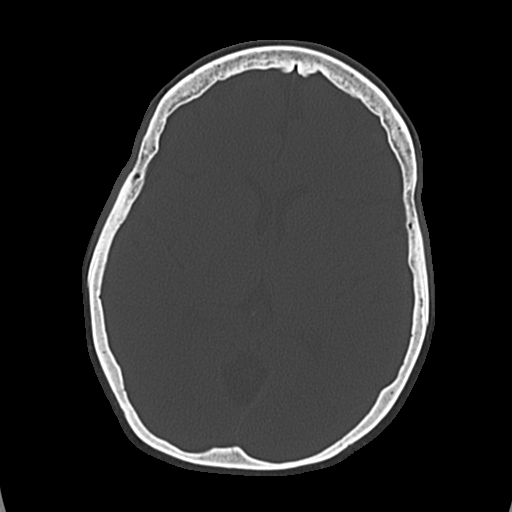
[im 28/51  bone]
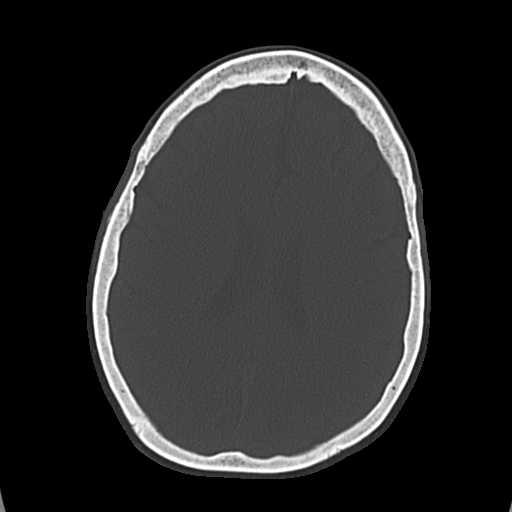
[im 34/51  bone]
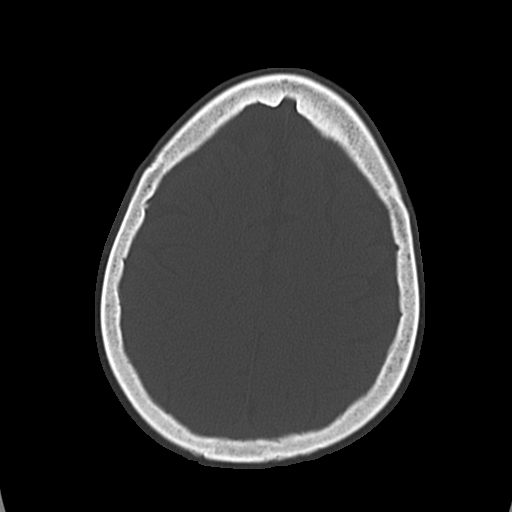
[im 39/51  bone]
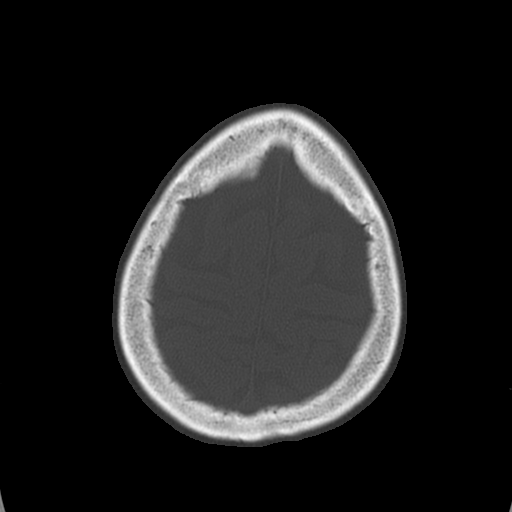
[im 45/51  bone]
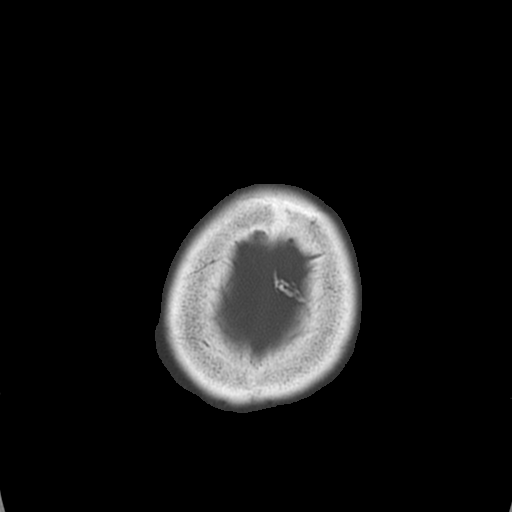

[17 of 30 positions shown; findings below may reference images not displayed]

FINDINGS: No evidence of parenchymal hemorrhage or extra-axial fluid
collection. No mass lesion, mass effect, or midline shift.

No CT evidence of acute infarction.

Mild subcortical white matter and periventricular small vessel
ischemic changes.

Cerebral volume is within normal limits.  No ventriculomegaly.

The visualized paranasal sinuses are essentially clear. The mastoid
air cells are unopacified.

No evidence of calvarial fracture.
IMPRESSION: No evidence of acute intracranial abnormality.

Mild small vessel ischemic changes.

## 2017-03-04 IMAGING — CR DG RIBS W/ CHEST 3+V*R*
5 series · 5 of 5 positions shown · non-contrast
Comparison: Chest CT 12/19/2014

CLINICAL DATA: Fell today.  Right-sided chest pain.

EXAM:
RIGHT RIBS AND CHEST - 3+ VIEW

[t ribs ap upper right]
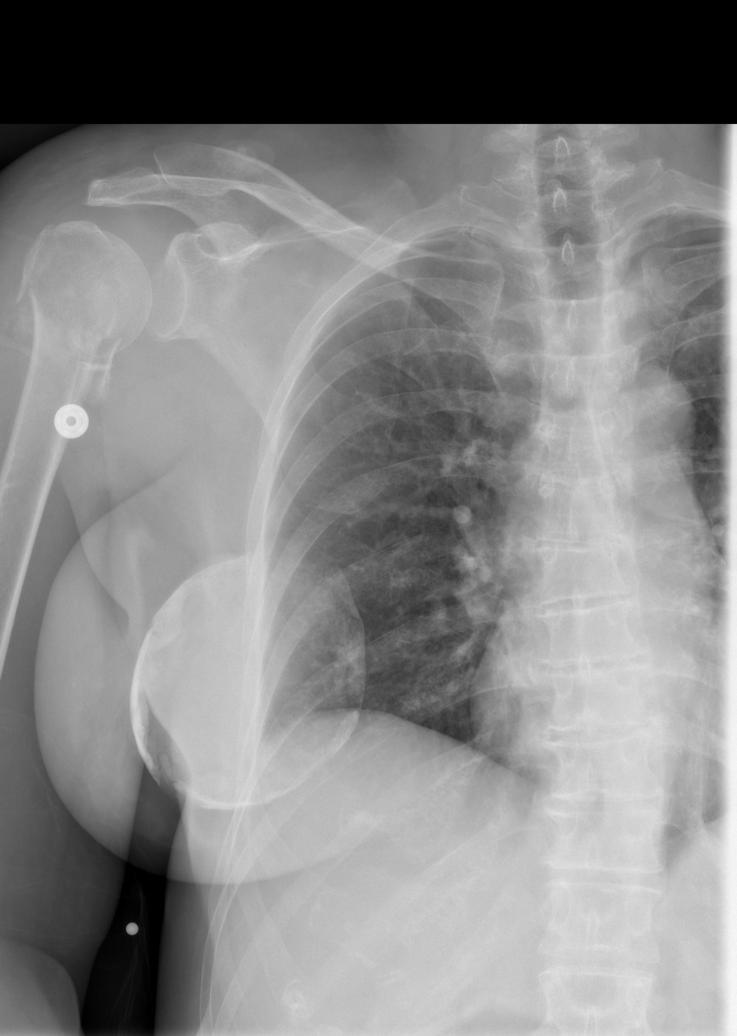

[t ribs ap lower right]
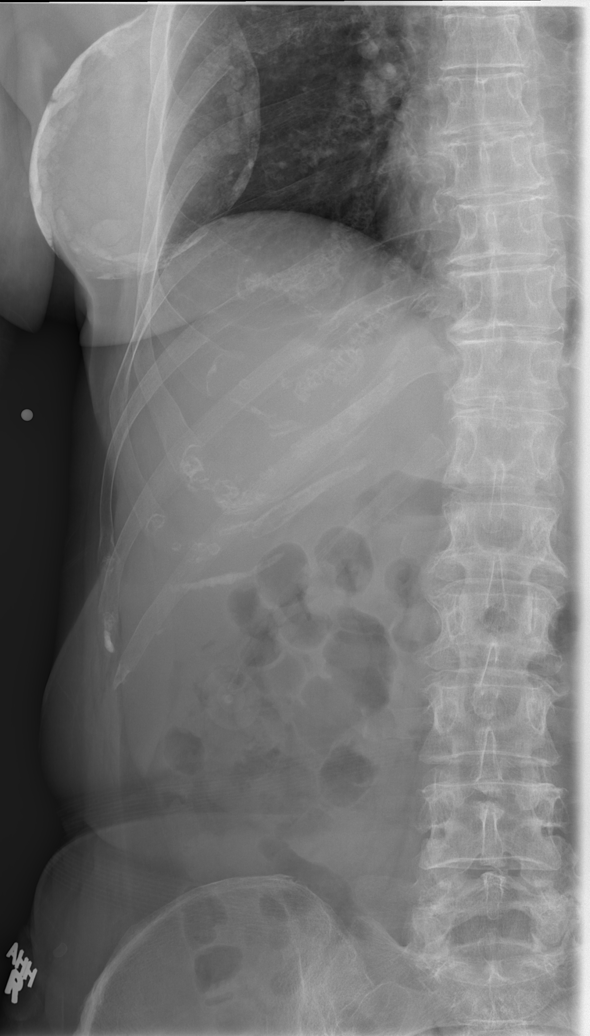

[t ribs rpo right (1 of 2)]
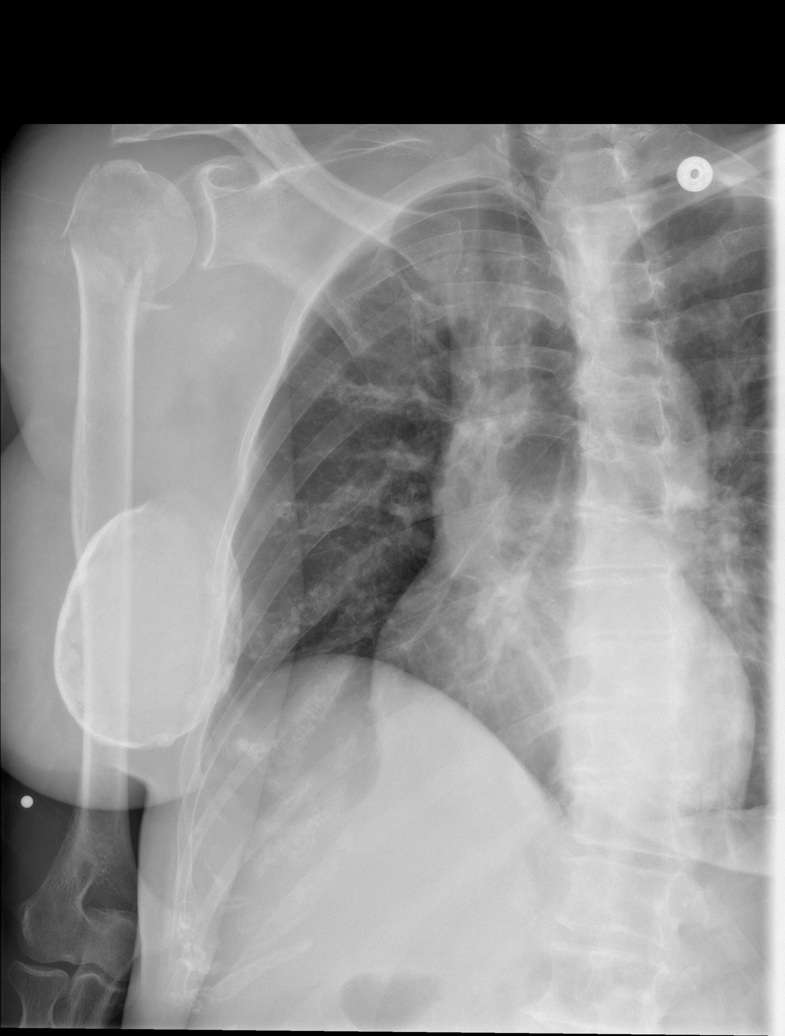

[t ribs rpo right (2 of 2)]
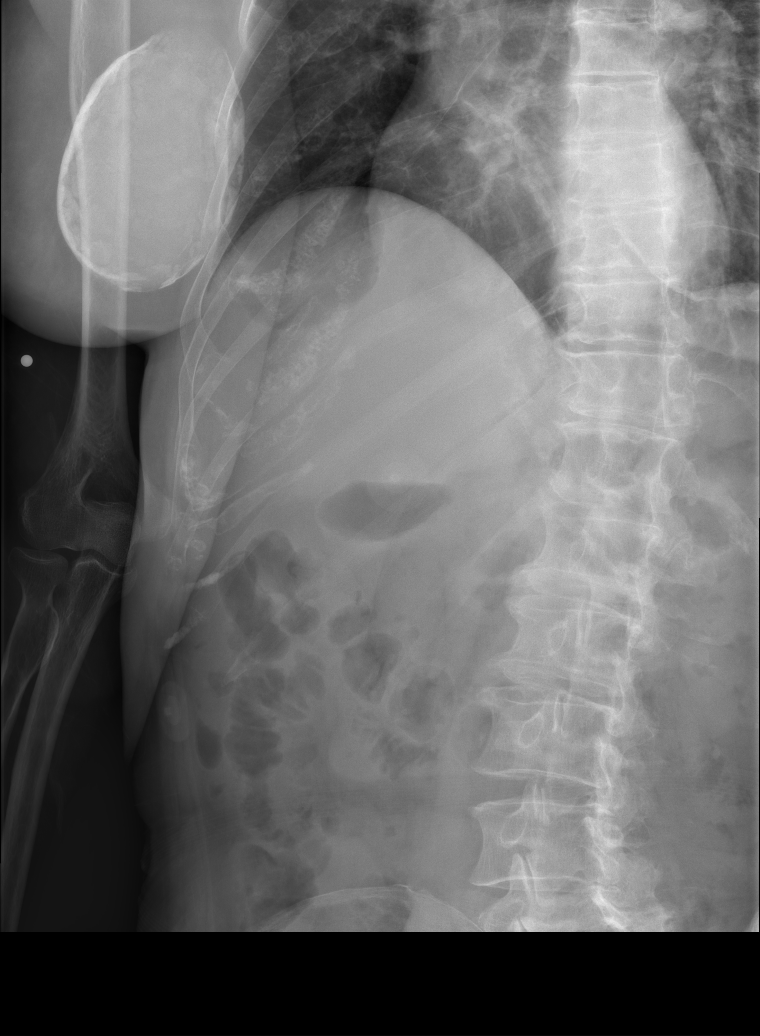

[x chest ap]
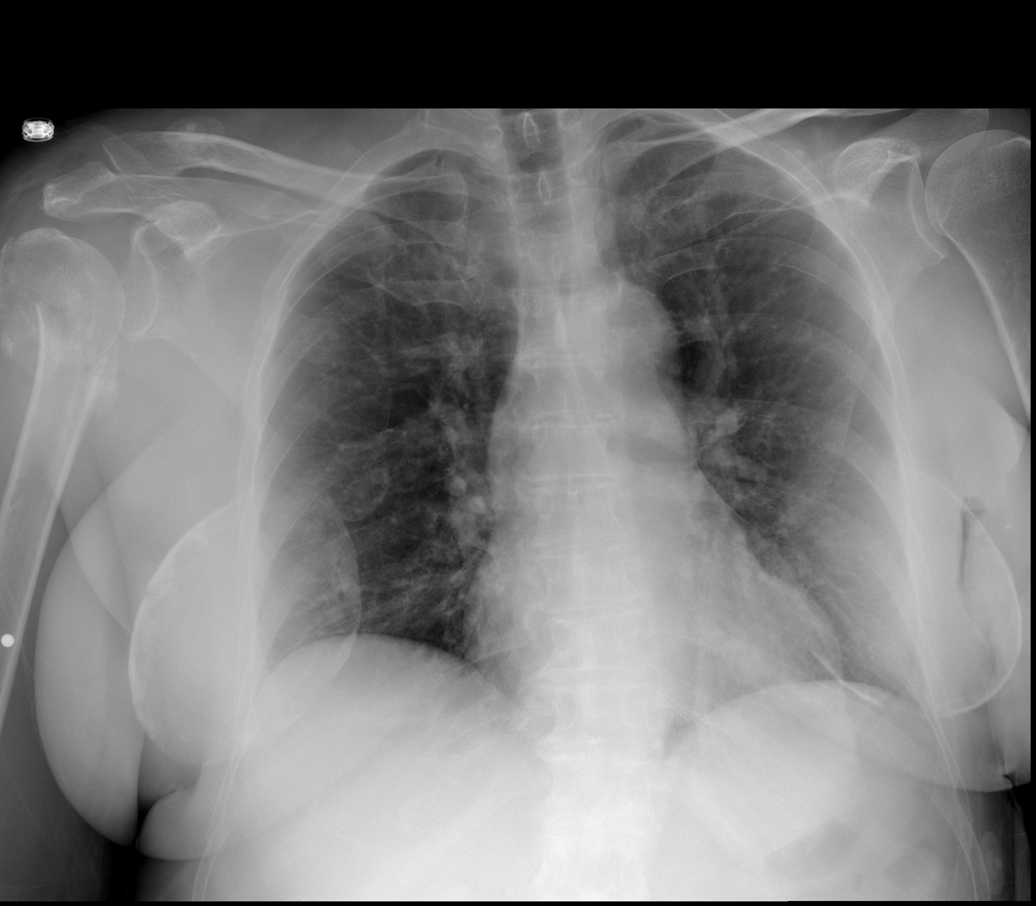

[5 of 5 positions shown; findings below may reference images not displayed]

FINDINGS: The cardiac silhouette, mediastinal and hilar contours are within
normal limits and stable. No acute pulmonary findings.

Dedicated views of the right ribs do not demonstrate any definite
acute right-sided rib fractures.
IMPRESSION: No acute cardiopulmonary findings and no definite rib fractures.

Right Humeral head and neck fractures noted.

## 2017-03-04 IMAGING — CR DG THORACIC SPINE 2V
3 series · 3 of 3 positions shown · non-contrast
Comparison: 12/19/2014

CLINICAL DATA: Fall.  Pain in chest and shoulder.

EXAM:
THORACIC SPINE - 2-3 VIEWS

[t thoracic spine ap]
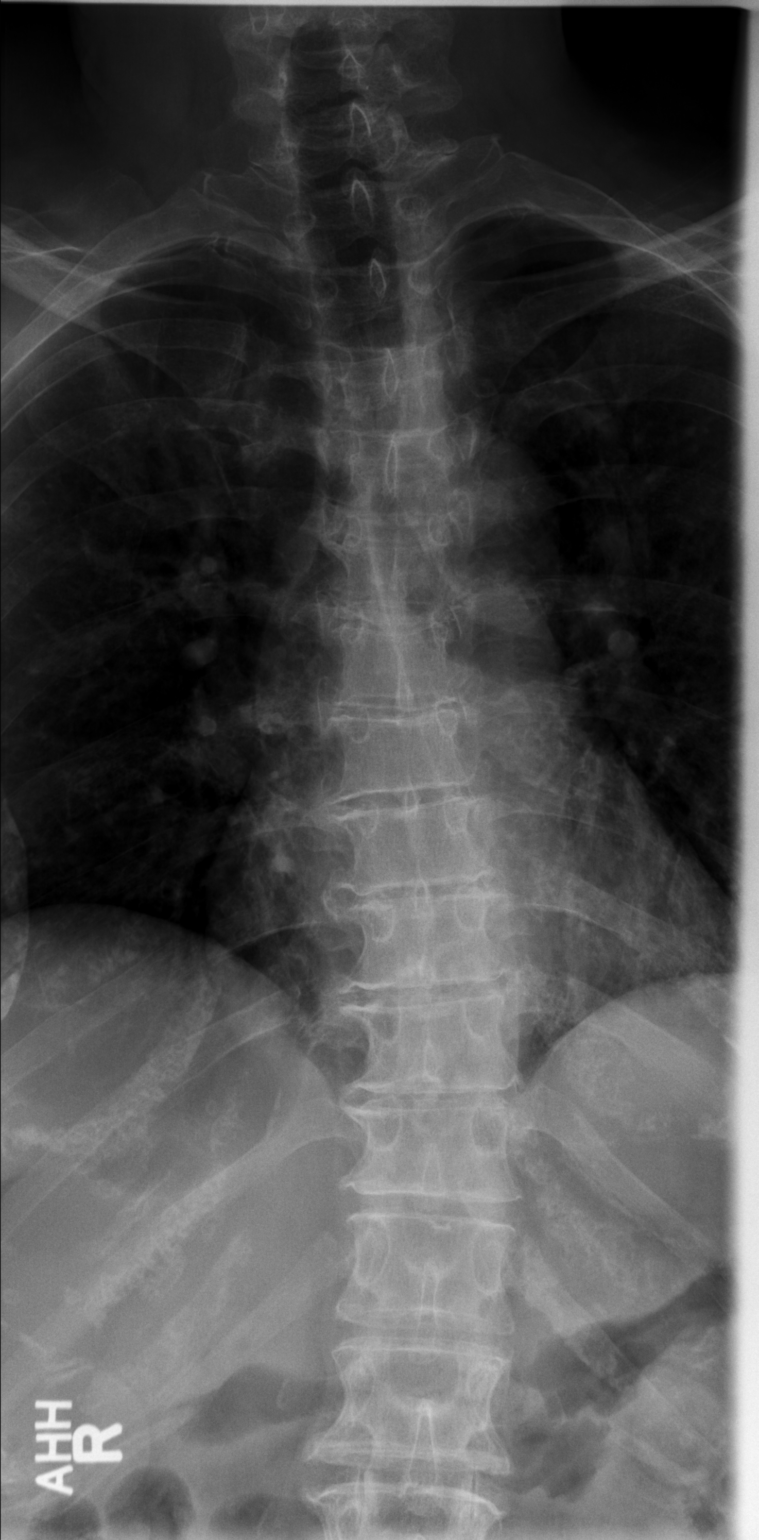

[w thoracic spine lat]
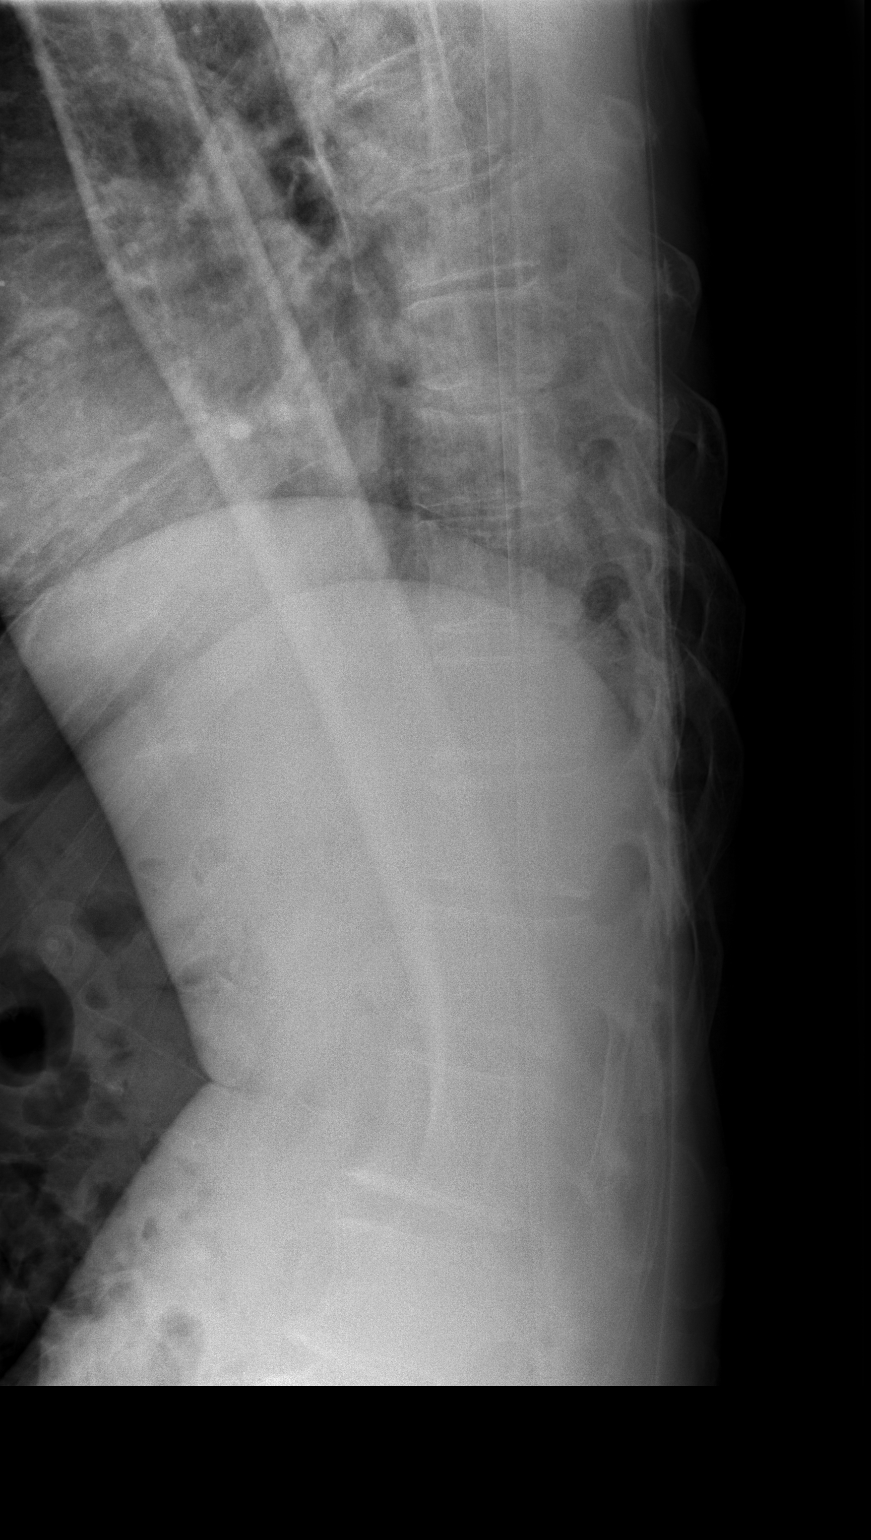

[w thoracic swimmers]
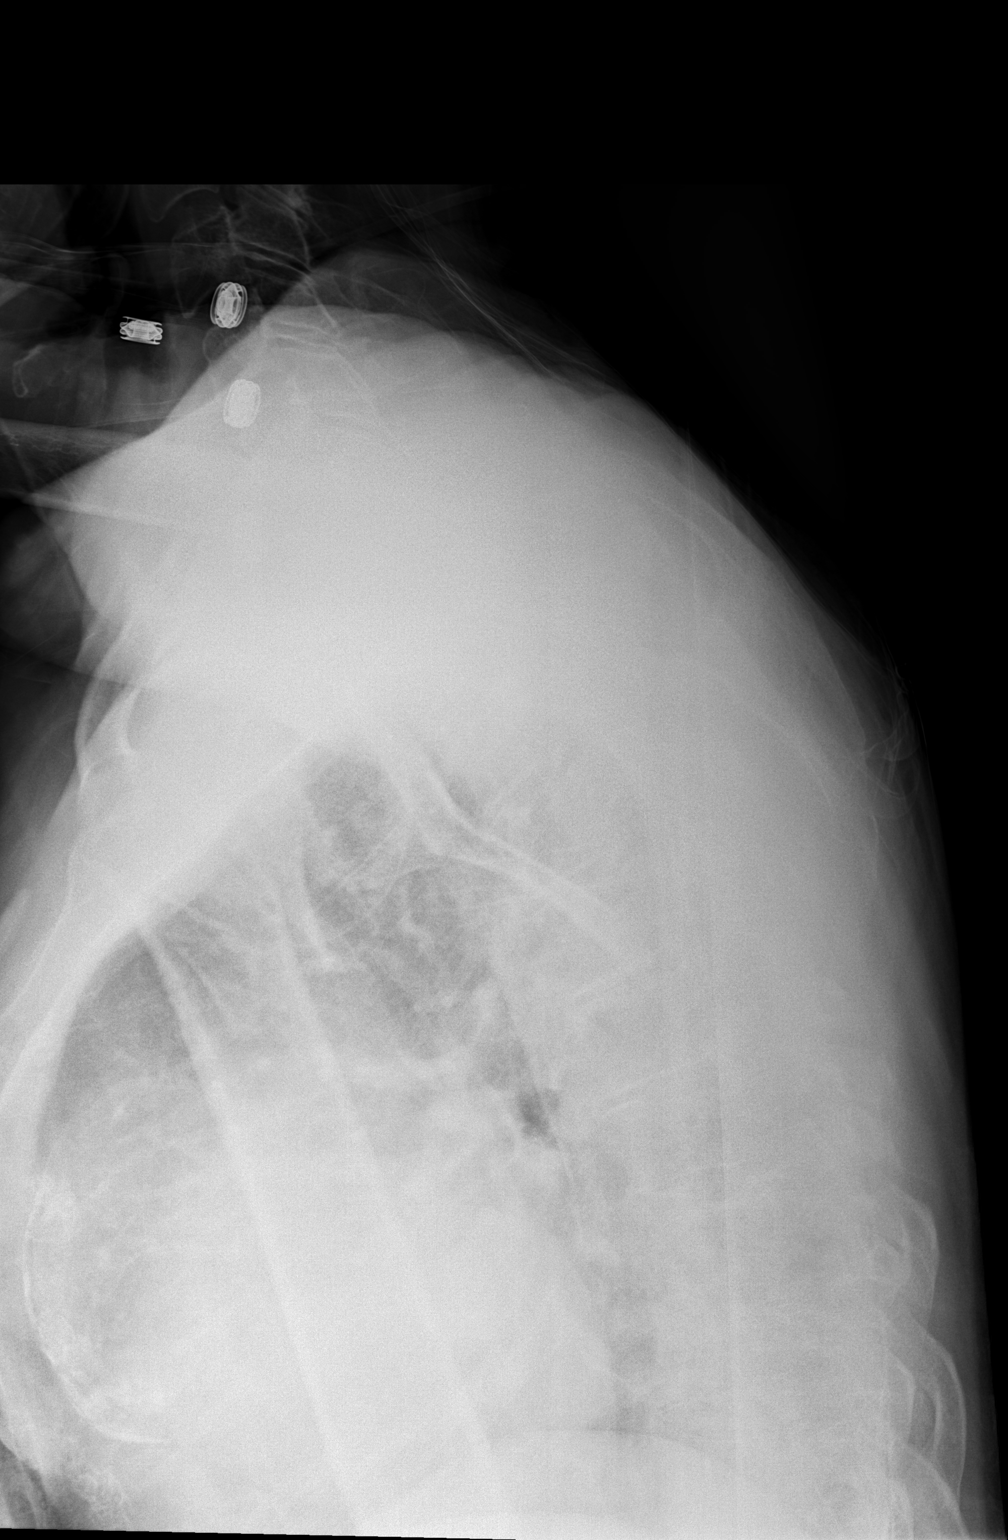

[3 of 3 positions shown; findings below may reference images not displayed]

FINDINGS: Thoracic spondylosis.  Mild levoconvex thoracic scoliosis.

Poor visibility of the thoracic spine on the lateral projections,
much of which is due to arm positioning and underpenetration. The
upper half of the thoracic spine is especially obscured, with very
poor definition of the thoracic vertebral cortex. The
cervicothoracic junction is not visualized.
IMPRESSION: 1. Reduce sensitivity to to underpenetration, much of which is due
to difficulty with positioning of the patient's right arm (the
patient has a displaced right proximal humeral fracture). No obvious
thoracic spine fracture identified but the lateral projections are
limited.

## 2017-03-04 IMAGING — CR DG HUMERUS 2V *R*
3 series · 3 of 3 positions shown · non-contrast
Comparison: None.

CLINICAL DATA: Fell today.  Right shoulder pain.

EXAM:
RIGHT HUMERUS - 2+ VIEW

[t humerus ap right]
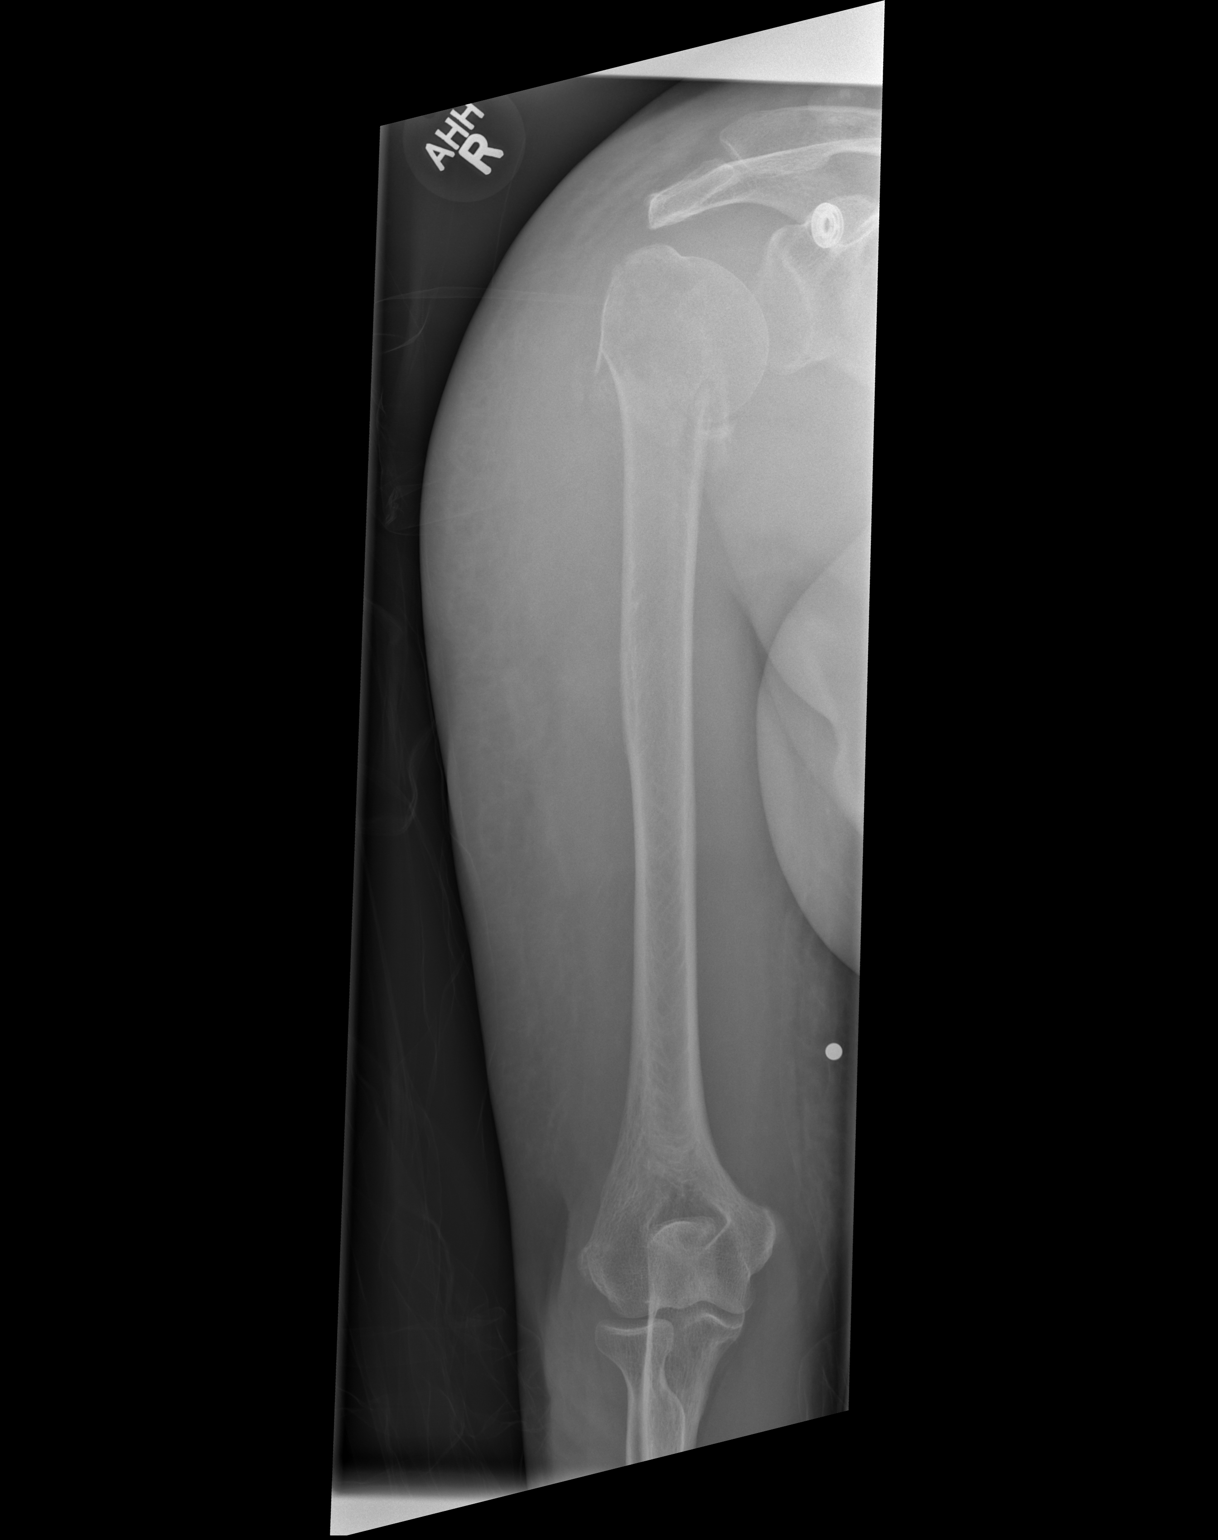

[x humerus lat right (1 of 2)]
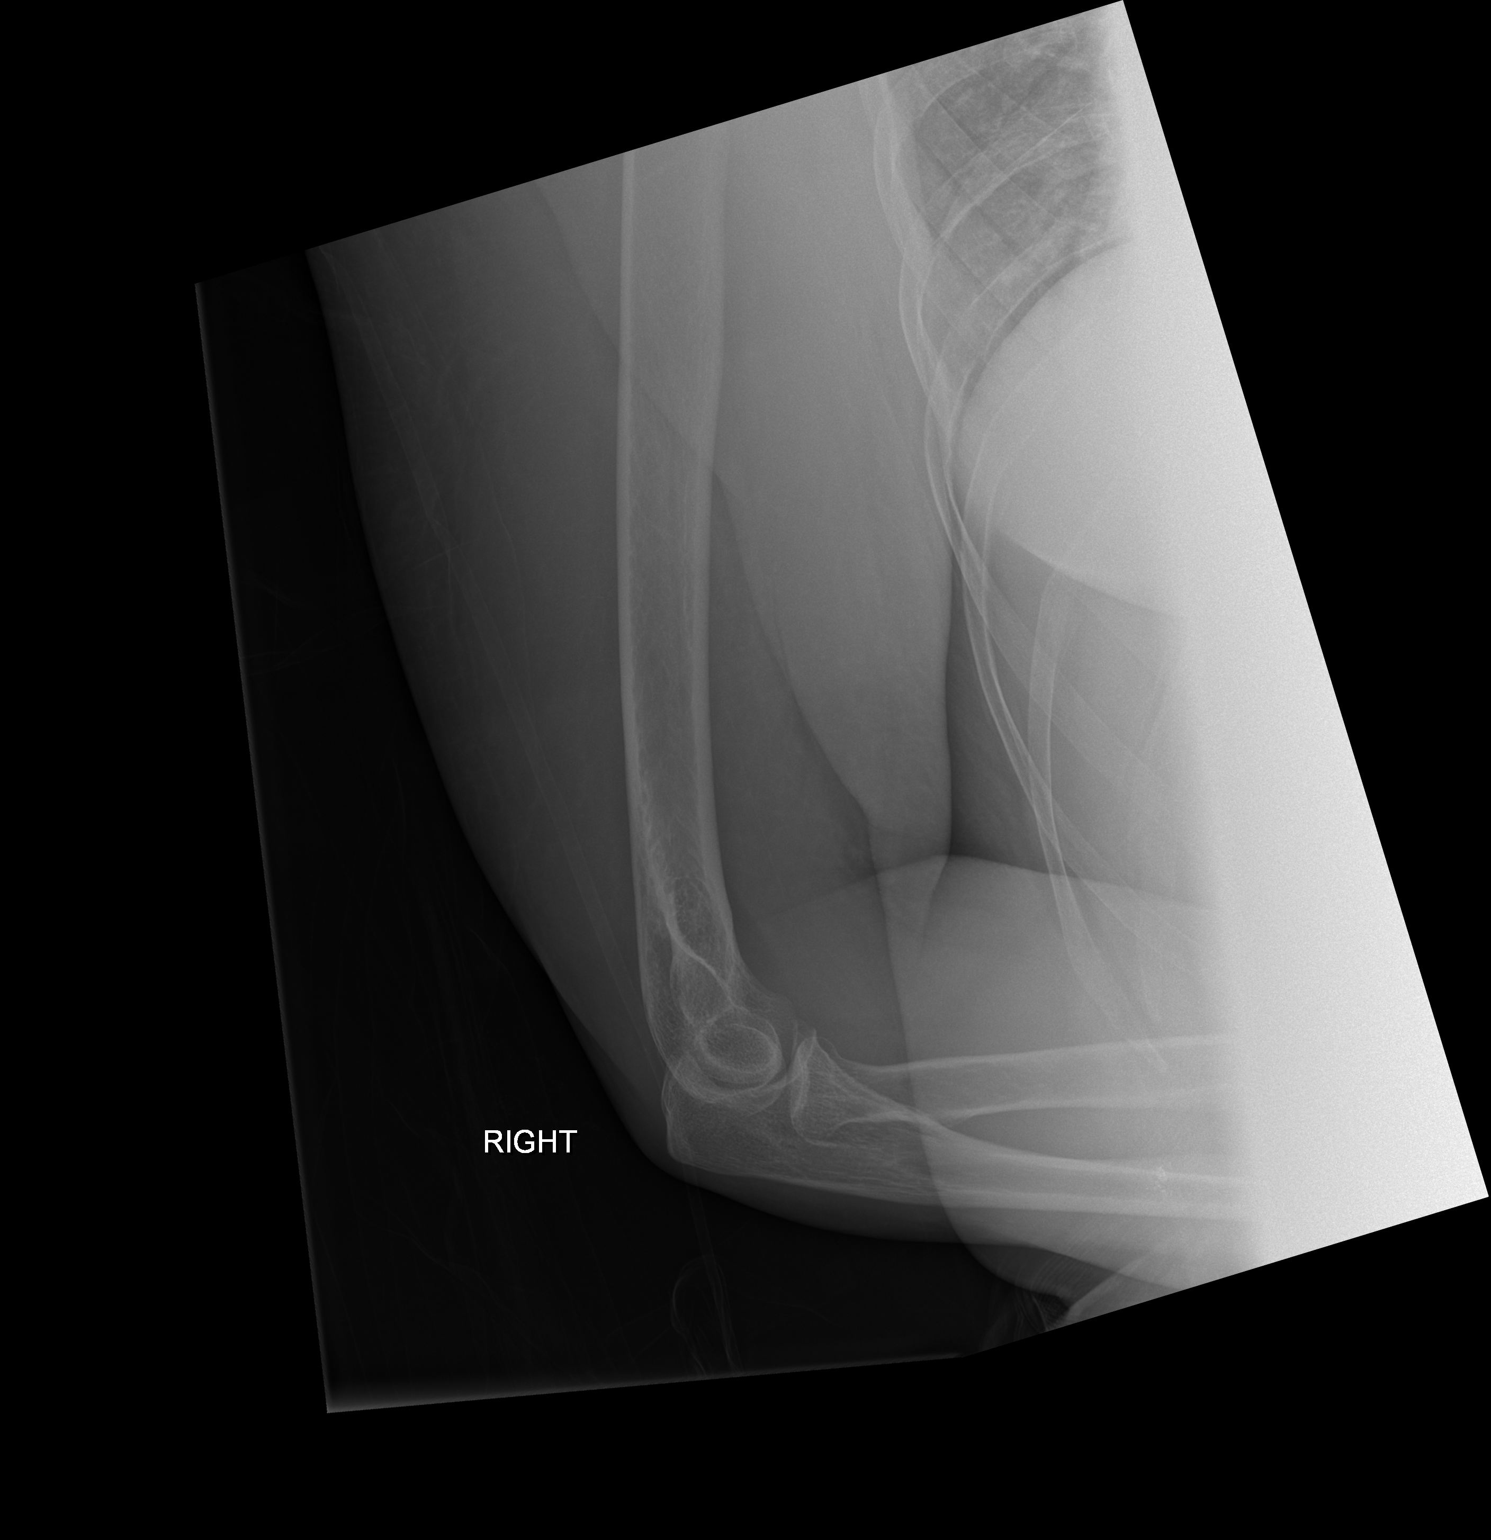

[x humerus lat right (2 of 2)]
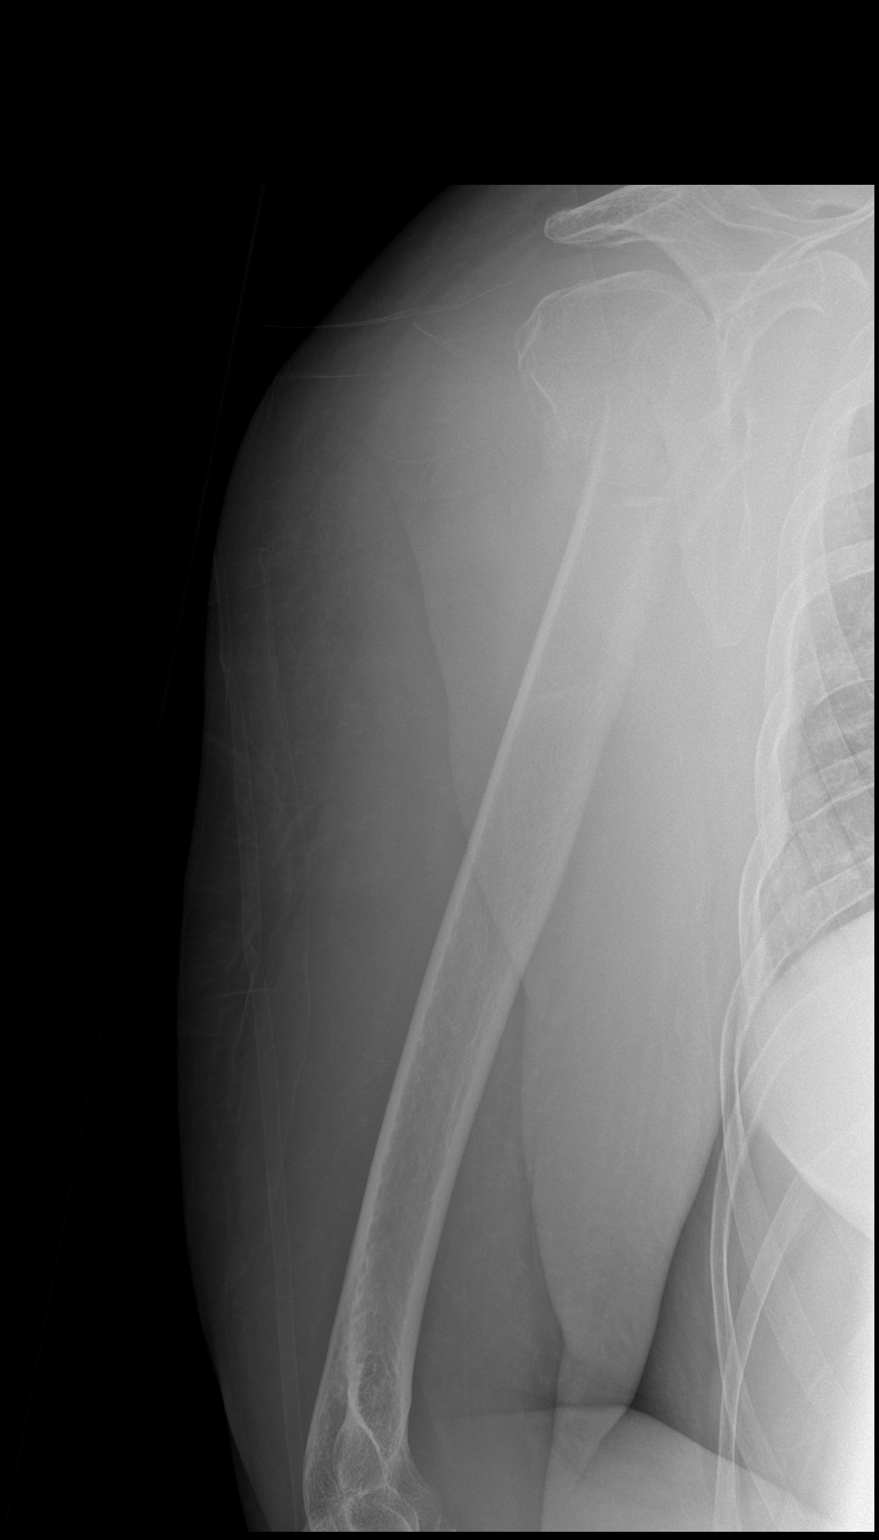

[3 of 3 positions shown; findings below may reference images not displayed]

FINDINGS: Displaced humeral head and neck fractures are again demonstrated. No
humeral shaft fracture. The elbow joint is intact.
IMPRESSION: Humeral head and neck fractures but no humeral shaft fracture.

## 2017-03-04 IMAGING — CR DG SHOULDER 2+V*R*
3 series · 3 of 3 positions shown · non-contrast
Comparison: None.

CLINICAL DATA: Fall.  Pain in the right shoulder.

EXAM:
RIGHT SHOULDER - 2+ VIEW

[t shoulder internal right]
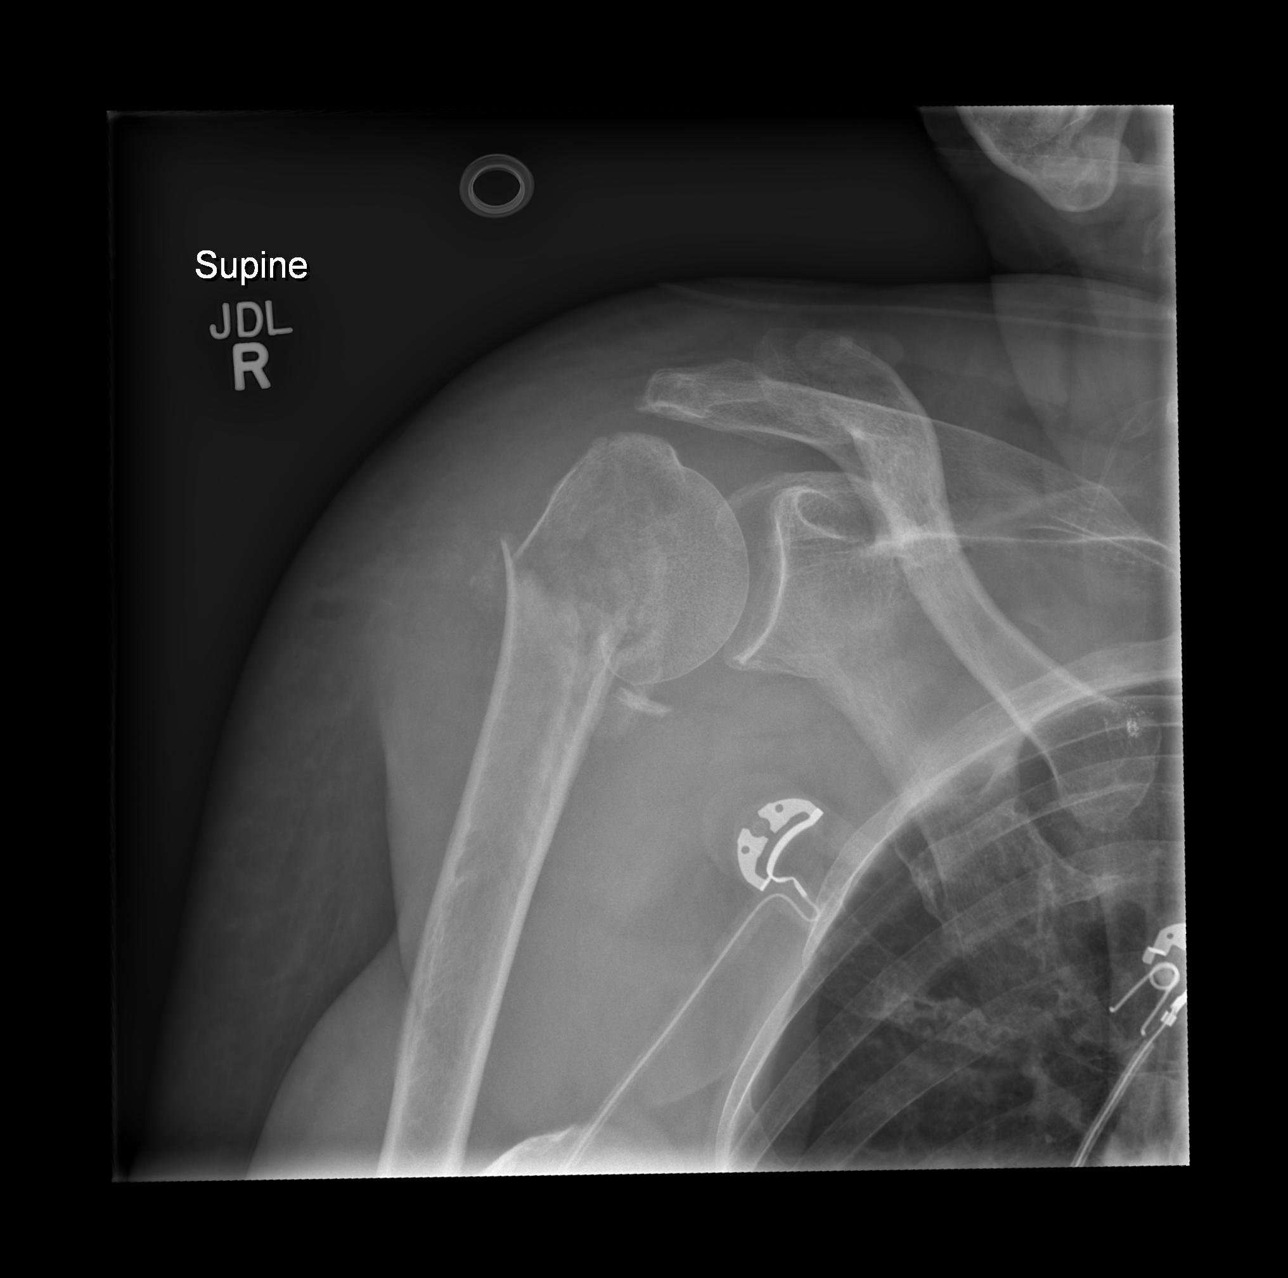

[t shoulder y-view right (1 of 2)]
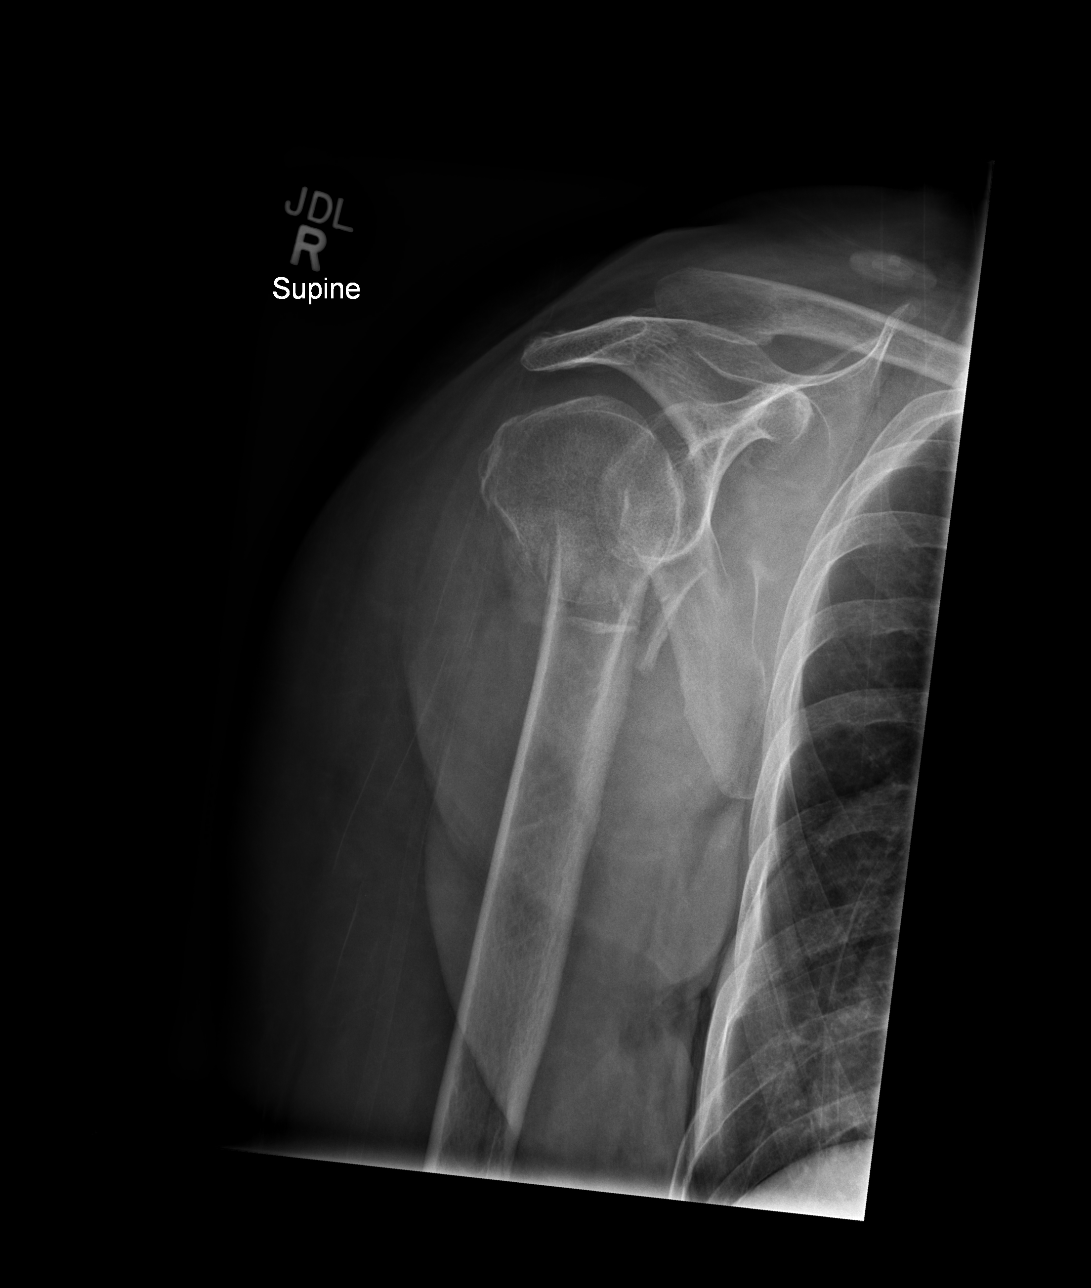

[t shoulder y-view right (2 of 2)]
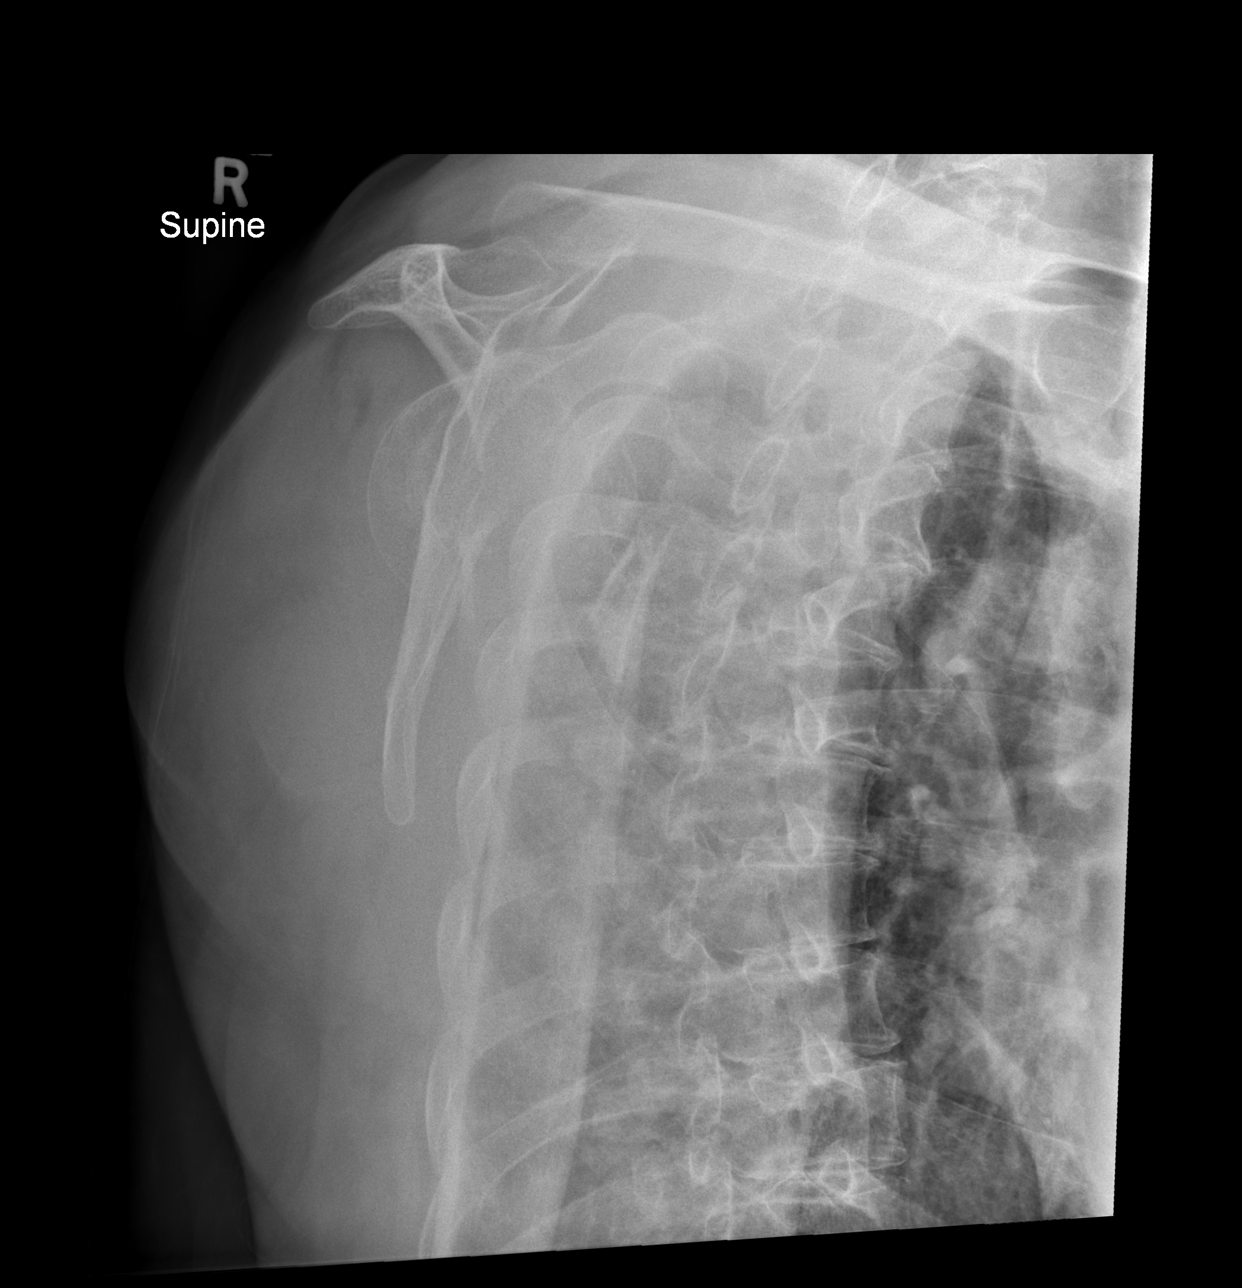

[3 of 3 positions shown; findings below may reference images not displayed]

FINDINGS: Proximal humeral metaphyseal fracture noted with varus angulation in
several small marginal fragments. Questionable linear fracture
involving the greater tuberosity. The dominant fracture plane
appears displaced over 1 cm.

Thoracic spondylosis.
IMPRESSION: 1. Right humeral surgical neck fracture is displaced by greater than
1 cm. Several small fragments are observed. Questionable linear
fracture extension into the greater tuberosity.

## 2017-03-05 ENCOUNTER — Ambulatory Visit: Payer: PPO | Attending: Internal Medicine | Admitting: Physical Therapy

## 2017-03-05 DIAGNOSIS — Z8673 Personal history of transient ischemic attack (TIA), and cerebral infarction without residual deficits: Secondary | ICD-10-CM | POA: Diagnosis not present

## 2017-03-05 DIAGNOSIS — F1320 Sedative, hypnotic or anxiolytic dependence, uncomplicated: Secondary | ICD-10-CM | POA: Diagnosis not present

## 2017-03-05 DIAGNOSIS — F1120 Opioid dependence, uncomplicated: Secondary | ICD-10-CM | POA: Diagnosis not present

## 2017-03-05 DIAGNOSIS — R296 Repeated falls: Secondary | ICD-10-CM

## 2017-03-05 DIAGNOSIS — M6281 Muscle weakness (generalized): Secondary | ICD-10-CM | POA: Diagnosis not present

## 2017-03-05 DIAGNOSIS — N952 Postmenopausal atrophic vaginitis: Secondary | ICD-10-CM | POA: Diagnosis not present

## 2017-03-05 NOTE — Patient Instructions (Signed)
   Abduction: Clam (Eccentric) - Side-Lying   Lie on side with knees bent. Lift top knee, keeping feet together. Keep trunk steady. Slowly lower for 3-5 seconds. __15_ reps per set, _1__ sets per day, __7_ days per week.  Copyright  VHI. All rights reserved.       Ruben Im PT [facility] [address] [place],  [address] Phone # [phone] Fax [phone]

## 2017-03-05 NOTE — Therapy (Addendum)
[facility] [address] [place], [place], [address] Phone: [phone]   Fax:  [phone]  Physical Therapy Evaluation/Discharge Summary  Patient Details  Name: Dorothy White MRN: [id number] Date of Birth: 05/15/1951 Referring Provider: Dr. Calvert Cantor  Encounter Date: 03/05/2017      PT End of Session - 03/05/17 1329    Visit Number 1   Number of Visits 10   Date for PT Re-Evaluation 04/30/17   Authorization Type medicare g-code 10th visit   PT Start Time 1145   PT Stop Time 1230   PT Time Calculation (min) 45 min   Activity Tolerance Patient tolerated treatment well      Past Medical History:  Diagnosis Date  . Anemia    ?  Marland Kitchen Anxiety   . Arthritis    "everywhere" (04/25/2016)  . 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 Chronic back pain    "all over" (04/25/2016)  . Constipation   . Depression   . Diverticulitis   . GERD (gastroesophageal reflux disease)   . Headache    "weekly" (04/25/2016)  . 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 HLD (hyperlipidemia)    hx (04/25/2016)  . 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 Mild CAD    a. 25% mLAD, otherwise no sig disease 01/2016 cath.  Marland Kitchen NICM (nonischemic cardiomyopathy) (HCC)    a. EF 40-45% by echo 11/2015 at time of complicated migraine/neuro sx, 55-65% at time of cath 01/2016  . Stroke [facility]) 06/2013   "mini" stroke , ?possibly hemaplegic migraine per pt    Past Surgical History:  Procedure Laterality Date  . AUGMENTATION MAMMAPLASTY  1980  . BASAL CELL CARCINOMA EXCISION     "tip of my nose"  . BUNIONECTOMY Bilateral 10/2003  . CARDIAC CATHETERIZATION N/A 02/20/2016   Procedure: Left Heart Cath and Coronary Angiography;  Surgeon: Corky Crafts, MD;  Location: [facility] INVASIVE CV LAB;  Service: Cardiovascular;  Laterality: N/A;  . COLONOSCOPY    . DILATION AND CURETTAGE OF UTERUS    .  FRACTURE SURGERY    . IR GENERIC HISTORICAL  03/18/2016   IR SINUS/FIST TUBE CHK-NON GI 03/18/2016 Irish Lack, MD [facility]-INTERV RAD  . LAPAROSCOPIC SIGMOID COLECTOMY N/A 04/25/2016   Procedure: LAPAROSCOPIC SIGMOID COLECTOMY;  Surgeon: Almond Lint, MD;  Location: [facility] OR;  Service: General;  Laterality: N/A;  . OOPHORECTOMY Bilateral ~ 1999  . ORIF HUMERUS FRACTURE Right 02/15/2015   Procedure: OPEN REDUCTION INTERNAL FIXATION (ORIF) PROXIMAL HUMERUS FRACTURE;  Surgeon: Beverely Low, MD;  Location: [facility] OR;  Service: Orthopedics;  Laterality: Right;  . RHINOPLASTY  1976  . TONSILLECTOMY    . TUBAL LIGATION  ~ 1983  . VAGINAL HYSTERECTOMY  ~ 1998    There were no vitals filed for this visit.       Subjective Assessment - 03/05/17 1152    Subjective "I fall a lot" for about 4 years since TIA in 2014.  In past 6 months, I have fallen many times.  I have hemiplegic migraines and I fall on the floor and I can't get up.  I have no warning.  I have one a month.  I'm B12 deficient so I got a shot in the hospital and at the doctor's office.  They are also trying some medicine to see if that helps.  I lose my balance just standing up.     Pertinent  History chronic back pain;  neck pain;  osteopenia;  TIA;  right shoulder fx ;  abdominal surgery 1 year ago   Limitations House hold activities   Patient Stated Goals get my balance back;  quit falling.     Currently in Pain? Yes   Pain Score 3    Pain Location Back   Pain Orientation Lower   Pain Type Chronic pain   Pain Radiating Towards tingling in both legs   Aggravating Factors  sitting            [facility] PT Assessment - 03/05/17 0001      Assessment   Medical Diagnosis falls   Referring Provider Dr. Debbe Odea   Onset Date/Surgical Date --  4 years   Hand Dominance Right   Next MD Visit 1 week   Prior Therapy 2017     Precautions   Precautions Fall     Restrictions   Weight Bearing Restrictions No     Balance Screen   Has the  patient fallen in the past 6 months Yes   How many times? numerous   Has the patient had a decrease in activity level because of a fear of falling?  Yes   Is the patient reluctant to leave their home because of a fear of falling?  Yes     [place] residence   Living Arrangements Alone   Available Help at Discharge --  rent a room from a friend   Type of Home Other(Comment)  room is in [place] to enter   Home Layout --  lives in [place] - 2 wheels;Cane - single point   Additional Comments just got walker in the hospital but haven't used it yet  doesn't use her cane     Prior Function   Level of Independence Independent with basic ADLs   Vocation Retired   Leisure I don't do anything for fun     Observation/Other Assessments   Activities of Balance Confidence Scale (ABC Scale)  72.5% of self confidence     AROM   Lumbar Extension WFLs   Lumbar - Left Side Bend WFLs     Strength   Right/Left Hip Right;Left   Right Hip Extension 4/5   Right Hip ABduction 4/5   Left Hip Extension 3+/5   Left Hip ABduction 3/5   Lumbar Flexion 3+/5   Lumbar Extension 4-/5     Flexibility   Soft Tissue Assessment /Muscle Length yes   Hamstrings decreased HS length left > right     Ambulation/Gait   Gait Comments currently not using assistive device but recommended use of cane based on BERG results     Berg Balance Test   Sit to Stand Able to stand without using hands and stabilize independently   Standing Unsupported Able to stand safely 2 minutes   Sitting with Back Unsupported but Feet Supported on Floor or Stool Able to sit safely and securely 2 minutes   Stand to Sit Sits safely with minimal use of hands   Transfers Able to transfer safely, minor use of hands   Standing Unsupported with Eyes Closed Able to stand 10 seconds with supervision   Standing Ubsupported with Feet Together Able to place feet  together independently and stand for 1 minute with supervision   From Standing, Reach Forward with Outstretched Arm Can reach forward >12 cm safely (5")  From Standing Position, Pick up Object from [place] to pick up shoe safely and easily   From Standing Position, Turn to Look Behind Over each Shoulder Looks behind one side only/other side shows less weight shift   Turn 360 Degrees Able to turn 360 degrees safely one side only in 4 seconds or less   Standing Unsupported, Alternately Place Feet on Step/Stool Able to stand independently and complete 8 steps >20 seconds   Standing Unsupported, One Foot in Front Able to plae foot ahead of the other independently and hold 30 seconds   Standing on One Leg Able to lift leg independently and hold 5-10 seconds   Total Score 48     Timed Up and Go Test   Normal TUG (seconds) 9.6            Objective measurements completed on examination: See above findings.                  PT Education - 03/05/17 1328    Education provided Yes   Education Details clams; abdominal brace   Person(s) Educated Patient   Methods Explanation;Demonstration;Handout   Comprehension Verbalized understanding;Returned demonstration          PT Short Term Goals - 03/05/17 2052      PT SHORT TERM GOAL #1   Title The patient will demonstrate initial HEP for strengthening and balance   Time 4   Period Weeks   Status New   Target Date 04/02/17     PT SHORT TERM GOAL #2   Title The patient will have improved BERG balance test to 50/56 indicating improved safety    Time 4   Period Weeks   Status New     PT SHORT TERM GOAL #3   Title The patient will report a good understanding of strategies to reduce falls   Time 4   Period Weeks   Status New           PT Long Term Goals - 03/05/17 2055      PT LONG TERM GOAL #1   Title independent with HEP for further strengthening and balance improvement   Time 8   Period Weeks   Status New    Target Date 04/30/17     PT LONG TERM GOAL #2   Title BERG balance test improved to 52/56 indicating improved gait safety   Time 8   Period Weeks   Status New     PT LONG TERM GOAL #3   Title 5x sit to stand improved to 15 sec or less indicating improved strength and mobility   Time 8   Period Weeks     PT LONG TERM GOAL #4   Title Bilateral hip and core strenght grossly 4+/5 needed for stability while walking   Time 8   Period Weeks   Status New     PT LONG TERM GOAL #5   Title Activities based Confidence Scale score improved to 79%    Time 8   Period Weeks   Status New                Plan - 03/05/17 2022    Clinical Impression Statement The patient reports frequent falls over the last 6 months secondary to hemiplegic migraines which cause her to fall without warning in addition to tripping, bumping into things and difficulty walking on uneven terrain.  She has core and hip weakness (especially on left side).  Moderate fall risk  48/56 per BERG and discussed recommendation for cane.  She currently has a cane and RW but does not typically use either.  Her Activities Specific Balance Confidence is at 72.5 %.  Decreased Timed up and Go 9.6 and 5x sit to stand 18 sec without UE use.  She would benefit from PT to address these deficits.     History and Personal Factors relevant to plan of care: lack of home support and psychosocial support (her best friend died in 12/22/2022);  numerous co-morbidities including TIAs, right shoulder fx and abdominal surgery (1 year ago);  chronic LBP and neck pain   Clinical Presentation Evolving   Clinical Presentation due to: frequent falls for various reasons;  multi body regions affected   Clinical Decision Making Moderate   Rehab Potential Good   Clinical Impairments Affecting Rehab Potential osteopenia;  precaution secondary to sudden falls associated with hemiplegia migraines   PT Frequency 2x / week   PT Duration 8 weeks   PT  Treatment/Interventions ADLs/Self Care Home Management;Stair training;Functional mobility training;Neuromuscular re-education;Balance training;Therapeutic exercise;Therapeutic activities;Manual techniques   PT Next Visit Plan Check dynamic Gait Index;  review clams and add gluteus medius strengthening especially on left;  balance;  discuss fall prevention      Patient will benefit from skilled therapeutic intervention in order to improve the following deficits and impairments:  Impaired flexibility, Decreased range of motion, Pain, Decreased strength, Decreased activity tolerance, Decreased endurance, Decreased balance  Visit Diagnosis: Repeated falls - Plan: PT plan of care cert/re-cert  Muscle weakness (generalized) - Plan: PT plan of care cert/re-cert      G-Codes - 03/47/42 10-22-98    Functional Assessment Tool Used (Outpatient Only) Clinical impression; BERG   Functional Limitation Mobility: Walking and moving around   Mobility: Walking and Moving Around Current Status ([id number]) At least 40 percent but less than 60 percent impaired, limited or restricted   Mobility: Walking and Moving Around Goal Status [phone]) At least 20 percent but less than 40 percent impaired, limited or restricted     PHYSICAL THERAPY DISCHARGE SUMMARY  Visits from Start of Care: 1  Current functional level related to goals / functional outcomes: Following the initial evaluation the patient no-showed for the next 4 scheduled appointments and did not respond to multiple phone calls.  Will discharge from PT.     Remaining deficits: As above   Education / Equipment:  Plan:                                                    Patient goals were not met. Patient is being discharged due to not returning since the last visit.  ?????          Problem List Patient Active Problem List   Diagnosis Date Noted  . 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 Hx of transient ischemic attack (TIA) 02/19/2017  . Left carotid  stenosis- 40-59% 02/19/2017  . Malnutrition of moderate degree 02/18/2017  . Falls 02/17/2017  . Chest pain 09/26/2016  . Diverticulitis of sigmoid colon 04/25/2016  . Infection due to ESBL-producing Escherichia coli/Diverticular Abscess 03/15/2016  . Diverticulitis 03/12/2016  . Chronic pain syndrome 03/12/2016  . Lesion of pancreas 03/12/2016  . History of chest pain 03/12/2016  . Hypokalemia 03/12/2016  . Angina, class IV (HCC) - chest tightness and pressure with dyspnea  with minimal exertion 02/17/2016    Class: Question of  . TIA (transient ischemic attack) 12/26/2015  . DOE (dyspnea on exertion) 12/26/2015  . Right sided weakness 12/17/2015  . Ataxia 12/17/2015  . Shoulder fracture 02/15/2015  . Chronic headache 10/11/2013  . Weakness generalized 08/12/2013  . Dizziness and giddiness 08/12/2013  . Abnormality of gait 07/11/2013  . Hyperlipidemia 05/07/2007  . Migraine without aura 05/07/2007  . PREMATURE VENTRICULAR CONTRACTIONS, FREQUENT 05/07/2007  . GERD 05/07/2007  . DIVERTICULOSIS, COLON 05/07/2007  . 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 OSTEOPENIA 05/07/2007  . Anxiety and depression 03/24/2007  . HEMORRHOIDS 03/24/2007  . ALLERGIC RHINITIS 03/24/2007  . LOW BACK PAIN 03/24/2007  . MIGRAINES, HX OF 03/24/2007   Ruben Im, PT 03/05/17 9:04 PM Phone: [phone] Fax: [phone]  Alvera Singh 03/05/2017, 9:03 PM  [facility] [address] [place], [place], [address] Phone: [phone]   Fax:  [phone]  Name: Dorothy White MRN: [id number] Date of Birth: April 03, 1951

## 2017-03-06 DIAGNOSIS — F1120 Opioid dependence, uncomplicated: Secondary | ICD-10-CM | POA: Diagnosis not present

## 2017-03-06 DIAGNOSIS — F1320 Sedative, hypnotic or anxiolytic dependence, uncomplicated: Secondary | ICD-10-CM | POA: Diagnosis not present

## 2017-03-10 ENCOUNTER — Ambulatory Visit: Payer: PPO | Admitting: Physical Therapy

## 2017-03-10 DIAGNOSIS — E538 Deficiency of other specified B group vitamins: Secondary | ICD-10-CM | POA: Diagnosis not present

## 2017-03-11 ENCOUNTER — Other Ambulatory Visit: Payer: Self-pay

## 2017-03-11 NOTE — Patient Outreach (Signed)
[facility]) Care Management  03/11/2017  Dorothy White [birth year] [id number]   Patient has not responded after calls and letter.  Will proceed with case closure.  Will notify care management assistant of case closure.  Jone Baseman, RN, MSN [place] [phone]

## 2017-03-15 IMAGING — CR DG SHOULDER 2+V PORT*R*
1 series · 1 of 1 positions shown · non-contrast
Comparison: Radiographs 02/04/2015.

CLINICAL DATA: Postop right proximal humeral fracture fixation.

EXAM:
PORTABLE RIGHT SHOULDER - 1 VIEW

[AP]
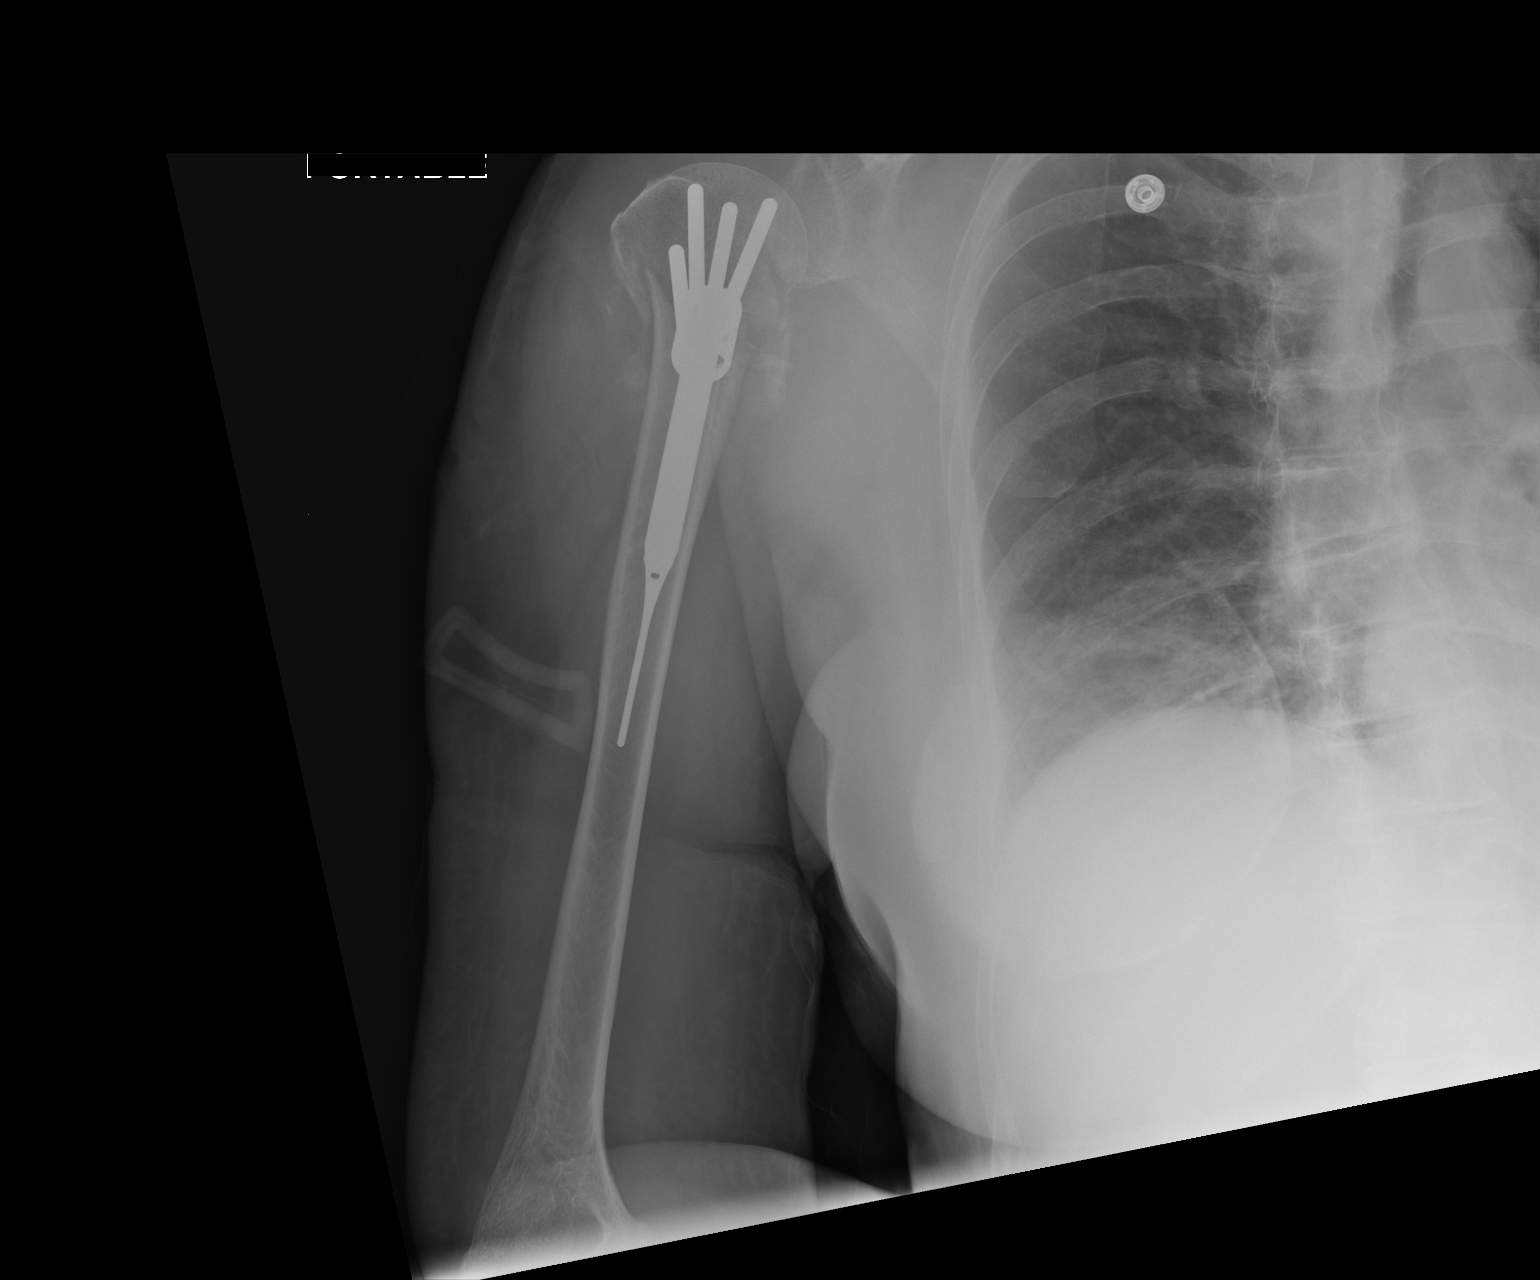

[1 of 1 positions shown; findings below may reference images not displayed]

FINDINGS: Single AP portable examination. Comminuted fracture of the right
humeral neck demonstrates stable mild displacement status post
interval open reduction and internal fixation. Mild inferior
subluxation of the humeral head is likely secondary to an underlying
hemarthrosis. No dislocation suggested on single AP imaging. The
glenoid appears intact.
IMPRESSION: Postop right humeral ORIF without demonstrated complication.

## 2017-03-16 DIAGNOSIS — F1320 Sedative, hypnotic or anxiolytic dependence, uncomplicated: Secondary | ICD-10-CM | POA: Diagnosis not present

## 2017-03-16 DIAGNOSIS — F1120 Opioid dependence, uncomplicated: Secondary | ICD-10-CM | POA: Diagnosis not present

## 2017-03-18 DIAGNOSIS — F1120 Opioid dependence, uncomplicated: Secondary | ICD-10-CM | POA: Diagnosis not present

## 2017-03-18 DIAGNOSIS — F1320 Sedative, hypnotic or anxiolytic dependence, uncomplicated: Secondary | ICD-10-CM | POA: Diagnosis not present

## 2017-03-19 ENCOUNTER — Ambulatory Visit: Payer: PPO

## 2017-03-20 DIAGNOSIS — F1120 Opioid dependence, uncomplicated: Secondary | ICD-10-CM | POA: Diagnosis not present

## 2017-03-20 DIAGNOSIS — F1320 Sedative, hypnotic or anxiolytic dependence, uncomplicated: Secondary | ICD-10-CM | POA: Diagnosis not present

## 2017-03-24 ENCOUNTER — Telehealth: Payer: Self-pay | Admitting: Physical Therapy

## 2017-03-24 ENCOUNTER — Encounter: Payer: PPO | Admitting: Physical Therapy

## 2017-03-24 NOTE — Telephone Encounter (Signed)
Patient no-showed for appt today.  Called and spoke with patient and she states "her calendar got all messed up."  She plans to be at the next appt on 8/30 at 4:15.

## 2017-03-26 ENCOUNTER — Ambulatory Visit: Payer: PPO | Admitting: Physical Therapy

## 2017-03-26 ENCOUNTER — Telehealth: Payer: Self-pay | Admitting: Physical Therapy

## 2017-03-26 DIAGNOSIS — E538 Deficiency of other specified B group vitamins: Secondary | ICD-10-CM | POA: Diagnosis not present

## 2017-03-26 DIAGNOSIS — Z5181 Encounter for therapeutic drug level monitoring: Secondary | ICD-10-CM | POA: Diagnosis not present

## 2017-03-26 DIAGNOSIS — F1120 Opioid dependence, uncomplicated: Secondary | ICD-10-CM | POA: Diagnosis not present

## 2017-03-26 DIAGNOSIS — F1320 Sedative, hypnotic or anxiolytic dependence, uncomplicated: Secondary | ICD-10-CM | POA: Diagnosis not present

## 2017-03-26 NOTE — Telephone Encounter (Signed)
Called patient and left message stating she will be discharged due to today being her 4th No show.  Zannie Cove, PT 03/26/17 4:45 PM

## 2017-03-31 ENCOUNTER — Emergency Department (HOSPITAL_COMMUNITY): Payer: PPO

## 2017-03-31 ENCOUNTER — Encounter (HOSPITAL_COMMUNITY): Payer: Self-pay | Admitting: Emergency Medicine

## 2017-03-31 ENCOUNTER — Encounter: Payer: PPO | Admitting: Physical Therapy

## 2017-03-31 ENCOUNTER — Inpatient Hospital Stay (HOSPITAL_COMMUNITY)
Admission: EM | Admit: 2017-03-31 | Discharge: 2017-04-06 | DRG: 470 | Disposition: A | Discharge status: Skilled Nursing Facility | Payer: PPO | Attending: Family Medicine | Admitting: Family Medicine

## 2017-03-31 ENCOUNTER — Inpatient Hospital Stay (HOSPITAL_COMMUNITY): Payer: PPO

## 2017-03-31 DIAGNOSIS — Y92000 Kitchen of unspecified non-institutional (private) residence as  the place of occurrence of the external cause: Secondary | ICD-10-CM | POA: Diagnosis not present

## 2017-03-31 DIAGNOSIS — W01198A Fall on same level from slipping, tripping and stumbling with subsequent striking against other object, initial encounter: Secondary | ICD-10-CM | POA: Diagnosis present

## 2017-03-31 DIAGNOSIS — M810 Age-related osteoporosis without current pathological fracture: Secondary | ICD-10-CM | POA: Diagnosis not present

## 2017-03-31 DIAGNOSIS — F17210 Nicotine dependence, cigarettes, uncomplicated: Secondary | ICD-10-CM | POA: Diagnosis not present

## 2017-03-31 DIAGNOSIS — S72012A Unspecified intracapsular fracture of left femur, initial encounter for closed fracture: Secondary | ICD-10-CM | POA: Diagnosis not present

## 2017-03-31 DIAGNOSIS — M1990 Unspecified osteoarthritis, unspecified site: Secondary | ICD-10-CM | POA: Diagnosis not present

## 2017-03-31 DIAGNOSIS — N179 Acute kidney failure, unspecified: Secondary | ICD-10-CM | POA: Diagnosis present

## 2017-03-31 DIAGNOSIS — Z79899 Other long term (current) drug therapy: Secondary | ICD-10-CM

## 2017-03-31 DIAGNOSIS — Z79891 Long term (current) use of opiate analgesic: Secondary | ICD-10-CM

## 2017-03-31 DIAGNOSIS — G43409 Hemiplegic migraine, not intractable, without status migrainosus: Secondary | ICD-10-CM | POA: Diagnosis not present

## 2017-03-31 DIAGNOSIS — S7292XA Unspecified fracture of left femur, initial encounter for closed fracture: Secondary | ICD-10-CM | POA: Diagnosis present

## 2017-03-31 DIAGNOSIS — S72002D Fracture of unspecified part of neck of left femur, subsequent encounter for closed fracture with routine healing: Secondary | ICD-10-CM | POA: Diagnosis not present

## 2017-03-31 DIAGNOSIS — G629 Polyneuropathy, unspecified: Secondary | ICD-10-CM | POA: Diagnosis present

## 2017-03-31 DIAGNOSIS — R410 Disorientation, unspecified: Secondary | ICD-10-CM | POA: Diagnosis not present

## 2017-03-31 DIAGNOSIS — I6522 Occlusion and stenosis of left carotid artery: Secondary | ICD-10-CM | POA: Diagnosis not present

## 2017-03-31 DIAGNOSIS — G8929 Other chronic pain: Secondary | ICD-10-CM | POA: Diagnosis not present

## 2017-03-31 DIAGNOSIS — Z9181 History of falling: Secondary | ICD-10-CM | POA: Diagnosis not present

## 2017-03-31 DIAGNOSIS — M549 Dorsalgia, unspecified: Secondary | ICD-10-CM | POA: Diagnosis not present

## 2017-03-31 DIAGNOSIS — I2510 Atherosclerotic heart disease of native coronary artery without angina pectoris: Secondary | ICD-10-CM | POA: Diagnosis present

## 2017-03-31 DIAGNOSIS — W19XXXA Unspecified fall, initial encounter: Secondary | ICD-10-CM | POA: Diagnosis not present

## 2017-03-31 DIAGNOSIS — F329 Major depressive disorder, single episode, unspecified: Secondary | ICD-10-CM | POA: Diagnosis present

## 2017-03-31 DIAGNOSIS — I6529 Occlusion and stenosis of unspecified carotid artery: Secondary | ICD-10-CM | POA: Diagnosis not present

## 2017-03-31 DIAGNOSIS — R9431 Abnormal electrocardiogram [ECG] [EKG]: Secondary | ICD-10-CM | POA: Diagnosis not present

## 2017-03-31 DIAGNOSIS — F1110 Opioid abuse, uncomplicated: Secondary | ICD-10-CM | POA: Diagnosis not present

## 2017-03-31 DIAGNOSIS — G8190 Hemiplegia, unspecified affecting unspecified side: Secondary | ICD-10-CM | POA: Diagnosis not present

## 2017-03-31 DIAGNOSIS — F419 Anxiety disorder, unspecified: Secondary | ICD-10-CM | POA: Diagnosis present

## 2017-03-31 DIAGNOSIS — D510 Vitamin B12 deficiency anemia due to intrinsic factor deficiency: Secondary | ICD-10-CM | POA: Diagnosis present

## 2017-03-31 DIAGNOSIS — R1311 Dysphagia, oral phase: Secondary | ICD-10-CM | POA: Diagnosis not present

## 2017-03-31 DIAGNOSIS — S064X0A Epidural hemorrhage without loss of consciousness, initial encounter: Secondary | ICD-10-CM | POA: Diagnosis not present

## 2017-03-31 DIAGNOSIS — Z471 Aftercare following joint replacement surgery: Secondary | ICD-10-CM | POA: Diagnosis not present

## 2017-03-31 DIAGNOSIS — S72009A Fracture of unspecified part of neck of unspecified femur, initial encounter for closed fracture: Secondary | ICD-10-CM | POA: Diagnosis not present

## 2017-03-31 DIAGNOSIS — R296 Repeated falls: Secondary | ICD-10-CM | POA: Diagnosis present

## 2017-03-31 DIAGNOSIS — Z96642 Presence of left artificial hip joint: Secondary | ICD-10-CM | POA: Diagnosis not present

## 2017-03-31 DIAGNOSIS — M6281 Muscle weakness (generalized): Secondary | ICD-10-CM | POA: Diagnosis not present

## 2017-03-31 DIAGNOSIS — S0101XA Laceration without foreign body of scalp, initial encounter: Secondary | ICD-10-CM | POA: Diagnosis present

## 2017-03-31 DIAGNOSIS — Z96649 Presence of unspecified artificial hip joint: Secondary | ICD-10-CM

## 2017-03-31 DIAGNOSIS — S0003XA Contusion of scalp, initial encounter: Secondary | ICD-10-CM | POA: Diagnosis not present

## 2017-03-31 DIAGNOSIS — Y92009 Unspecified place in unspecified non-institutional (private) residence as the place of occurrence of the external cause: Secondary | ICD-10-CM

## 2017-03-31 DIAGNOSIS — Z419 Encounter for procedure for purposes other than remedying health state, unspecified: Secondary | ICD-10-CM

## 2017-03-31 DIAGNOSIS — Z85828 Personal history of other malignant neoplasm of skin: Secondary | ICD-10-CM

## 2017-03-31 DIAGNOSIS — S199XXA Unspecified injury of neck, initial encounter: Secondary | ICD-10-CM | POA: Diagnosis not present

## 2017-03-31 DIAGNOSIS — F418 Other specified anxiety disorders: Secondary | ICD-10-CM | POA: Diagnosis not present

## 2017-03-31 DIAGNOSIS — I4581 Long QT syndrome: Secondary | ICD-10-CM | POA: Diagnosis present

## 2017-03-31 DIAGNOSIS — K219 Gastro-esophageal reflux disease without esophagitis: Secondary | ICD-10-CM | POA: Diagnosis not present

## 2017-03-31 DIAGNOSIS — G43909 Migraine, unspecified, not intractable, without status migrainosus: Secondary | ICD-10-CM | POA: Diagnosis not present

## 2017-03-31 DIAGNOSIS — S72002A Fracture of unspecified part of neck of left femur, initial encounter for closed fracture: Secondary | ICD-10-CM | POA: Diagnosis not present

## 2017-03-31 DIAGNOSIS — S299XXA Unspecified injury of thorax, initial encounter: Secondary | ICD-10-CM | POA: Diagnosis not present

## 2017-03-31 DIAGNOSIS — S92153A Displaced avulsion fracture (chip fracture) of unspecified talus, initial encounter for closed fracture: Secondary | ICD-10-CM | POA: Diagnosis not present

## 2017-03-31 DIAGNOSIS — Z7982 Long term (current) use of aspirin: Secondary | ICD-10-CM

## 2017-03-31 DIAGNOSIS — S0990XA Unspecified injury of head, initial encounter: Secondary | ICD-10-CM | POA: Diagnosis not present

## 2017-03-31 DIAGNOSIS — Z880 Allergy status to penicillin: Secondary | ICD-10-CM

## 2017-03-31 DIAGNOSIS — M12552 Traumatic arthropathy, left hip: Secondary | ICD-10-CM | POA: Diagnosis not present

## 2017-03-31 DIAGNOSIS — Z8673 Personal history of transient ischemic attack (TIA), and cerebral infarction without residual deficits: Secondary | ICD-10-CM

## 2017-03-31 DIAGNOSIS — M8000XA Age-related osteoporosis with current pathological fracture, unspecified site, initial encounter for fracture: Secondary | ICD-10-CM | POA: Diagnosis not present

## 2017-03-31 DIAGNOSIS — E785 Hyperlipidemia, unspecified: Secondary | ICD-10-CM | POA: Diagnosis not present

## 2017-03-31 DIAGNOSIS — S7290XA Unspecified fracture of unspecified femur, initial encounter for closed fracture: Secondary | ICD-10-CM | POA: Diagnosis present

## 2017-03-31 DIAGNOSIS — D62 Acute posthemorrhagic anemia: Secondary | ICD-10-CM | POA: Diagnosis not present

## 2017-03-31 DIAGNOSIS — Z6820 Body mass index (BMI) 20.0-20.9, adult: Secondary | ICD-10-CM

## 2017-03-31 DIAGNOSIS — R2689 Other abnormalities of gait and mobility: Secondary | ICD-10-CM | POA: Diagnosis not present

## 2017-03-31 DIAGNOSIS — E440 Moderate protein-calorie malnutrition: Secondary | ICD-10-CM | POA: Diagnosis present

## 2017-03-31 DIAGNOSIS — R55 Syncope and collapse: Secondary | ICD-10-CM | POA: Diagnosis not present

## 2017-03-31 DIAGNOSIS — M542 Cervicalgia: Secondary | ICD-10-CM | POA: Diagnosis not present

## 2017-03-31 DIAGNOSIS — R488 Other symbolic dysfunctions: Secondary | ICD-10-CM | POA: Diagnosis not present

## 2017-03-31 DIAGNOSIS — R079 Chest pain, unspecified: Secondary | ICD-10-CM | POA: Diagnosis not present

## 2017-03-31 DIAGNOSIS — F32A Depression, unspecified: Secondary | ICD-10-CM | POA: Diagnosis present

## 2017-03-31 LAB — CBC WITH DIFFERENTIAL/PLATELET
BASOS ABS: 0 10*3/uL (ref 0.0–0.1)
Basophils Relative: 0 %
EOS PCT: 0 %
Eosinophils Absolute: 0 10*3/uL (ref 0.0–0.7)
HEMATOCRIT: 43.7 % (ref 36.0–46.0)
Hemoglobin: 14 g/dL (ref 12.0–15.0)
LYMPHS ABS: 0.6 10*3/uL — AB (ref 0.7–4.0)
LYMPHS PCT: 6 %
MCH: 27.9 pg (ref 26.0–34.0)
MCHC: 32 g/dL (ref 30.0–36.0)
MCV: 87.1 fL (ref 78.0–100.0)
Monocytes Absolute: 0.5 10*3/uL (ref 0.1–1.0)
Monocytes Relative: 5 %
Neutro Abs: 8.6 10*3/uL — ABNORMAL HIGH (ref 1.7–7.7)
Neutrophils Relative %: 89 %
PLATELETS: 205 10*3/uL (ref 150–400)
RBC: 5.02 MIL/uL (ref 3.87–5.11)
RDW: 13.7 % (ref 11.5–15.5)
WBC: 9.7 10*3/uL (ref 4.0–10.5)

## 2017-03-31 LAB — APTT: APTT: 25 s (ref 24–36)

## 2017-03-31 LAB — BASIC METABOLIC PANEL
ANION GAP: 9 (ref 5–15)
BUN: 14 mg/dL (ref 6–20)
CHLORIDE: 107 mmol/L (ref 101–111)
CO2: 24 mmol/L (ref 22–32)
Calcium: 9.4 mg/dL (ref 8.9–10.3)
Creatinine, Ser: 1.03 mg/dL — ABNORMAL HIGH (ref 0.44–1.00)
GFR calc Af Amer: >60 mL/min (ref >60)
GFR, EST NON AFRICAN AMERICAN: 56 mL/min — AB (ref >60)
GLUCOSE: 97 mg/dL (ref 65–99)
POTASSIUM: 4 mmol/L (ref 3.5–5.1)
Sodium: 140 mmol/L (ref 135–145)

## 2017-03-31 LAB — HEPATIC FUNCTION PANEL
ALBUMIN: 3.9 g/dL (ref 3.5–5.0)
ALT: 15 U/L (ref 14–54)
AST: 15 U/L (ref 15–41)
Alkaline Phosphatase: 94 U/L (ref 38–126)
Bilirubin, Direct: 0.1 mg/dL (ref 0.1–0.5)
Indirect Bilirubin: 0.7 mg/dL (ref 0.3–0.9)
TOTAL PROTEIN: 6.8 g/dL (ref 6.5–8.1)
Total Bilirubin: 0.8 mg/dL (ref 0.3–1.2)

## 2017-03-31 LAB — SEDIMENTATION RATE: SED RATE: 5 mm/h (ref 0–22)

## 2017-03-31 LAB — PROTIME-INR
INR: 0.97
Prothrombin Time: 12.8 seconds (ref 11.4–15.2)

## 2017-03-31 LAB — C-REACTIVE PROTEIN: CRP: <0.8 mg/dL (ref <1.0)

## 2017-03-31 MED ORDER — BUPRENORPHINE HCL-NALOXONE HCL 2-0.5 MG SL FILM: 1.0000 | ORAL_FILM | Freq: Every day | SUBLINGUAL | Status: DC

## 2017-03-31 MED ORDER — MORPHINE SULFATE (PF) 4 MG/ML IV SOLN
0.5000 mg | INTRAVENOUS | Status: DC | PRN
  Administered 2017-04-01 – 2017-04-02 (×2): 0.52 mg via INTRAVENOUS
  Filled 2017-03-31: qty 1

## 2017-03-31 MED ORDER — FLUOXETINE HCL 20 MG PO CAPS
40.0000 mg | ORAL_CAPSULE | Freq: Every day | ORAL | Status: DC
  Administered 2017-03-31 – 2017-04-06 (×7): 40 mg via ORAL
  Filled 2017-03-31 (×7): qty 2

## 2017-03-31 MED ORDER — HYDROCODONE-ACETAMINOPHEN 5-325 MG PO TABS: 1.0000 | ORAL_TABLET | Freq: Four times a day (QID) | ORAL | Status: DC | PRN

## 2017-03-31 MED ORDER — TOPIRAMATE 25 MG PO TABS
50.0000 mg | ORAL_TABLET | Freq: Two times a day (BID) | ORAL | Status: DC
  Administered 2017-03-31 – 2017-04-06 (×12): 50 mg via ORAL
  Filled 2017-03-31 (×12): qty 2

## 2017-03-31 MED ORDER — FENTANYL CITRATE (PF) 100 MCG/2ML IJ SOLN
100.0000 ug | Freq: Once | INTRAMUSCULAR | Status: AC
  Administered 2017-03-31: 100 ug via INTRAVENOUS
  Filled 2017-03-31: qty 2

## 2017-03-31 MED ORDER — FLUTICASONE PROPIONATE 50 MCG/ACT NA SUSP
2.0000 | Freq: Every day | NASAL | Status: DC
  Administered 2017-04-02 – 2017-04-06 (×5): 2 via NASAL
  Filled 2017-03-31 (×2): qty 16

## 2017-03-31 MED ORDER — ENSURE ENLIVE PO LIQD
237.0000 mL | Freq: Two times a day (BID) | ORAL | Status: DC
  Administered 2017-04-03 – 2017-04-06 (×7): 237 mL via ORAL

## 2017-03-31 MED ORDER — SODIUM CHLORIDE 0.9 % IV SOLN
INTRAVENOUS | Status: DC
  Administered 2017-03-31: via INTRAVENOUS

## 2017-03-31 MED ORDER — OXYCODONE-ACETAMINOPHEN 10-325 MG PO TABS: 1.0000 | ORAL_TABLET | Freq: Three times a day (TID) | ORAL | Status: DC | PRN

## 2017-03-31 MED ORDER — POVIDONE-IODINE 10 % EX SWAB
2.0000 "application " | Freq: Once | CUTANEOUS | Status: AC
  Administered 2017-04-01: 2 via TOPICAL

## 2017-03-31 MED ORDER — OXYCODONE-ACETAMINOPHEN 5-325 MG PO TABS
1.0000 | ORAL_TABLET | Freq: Three times a day (TID) | ORAL | Status: DC | PRN
  Administered 2017-03-31 – 2017-04-02 (×5): 1 via ORAL
  Filled 2017-03-31 (×5): qty 1

## 2017-03-31 MED ORDER — TRAZODONE HCL 100 MG PO TABS
200.0000 mg | ORAL_TABLET | Freq: Every evening | ORAL | Status: DC | PRN
  Administered 2017-04-04: 200 mg via ORAL
  Filled 2017-03-31: qty 2

## 2017-03-31 MED ORDER — CLONAZEPAM 1 MG PO TABS
1.0000 mg | ORAL_TABLET | Freq: Every day | ORAL | Status: DC | PRN
  Administered 2017-04-03: 1 mg via ORAL
  Filled 2017-03-31: qty 1

## 2017-03-31 MED ORDER — TRANEXAMIC ACID 1000 MG/10ML IV SOLN
1000.0000 mg | INTRAVENOUS | Status: AC
  Administered 2017-04-01: 1000 mg via INTRAVENOUS
  Filled 2017-03-31 (×2): qty 10

## 2017-03-31 MED ORDER — OXYCODONE HCL 5 MG PO TABS
5.0000 mg | ORAL_TABLET | Freq: Three times a day (TID) | ORAL | Status: DC | PRN
  Administered 2017-04-01 – 2017-04-02 (×3): 5 mg via ORAL
  Filled 2017-03-31 (×3): qty 1

## 2017-03-31 MED ORDER — CEFAZOLIN SODIUM-DEXTROSE 2-4 GM/100ML-% IV SOLN
2.0000 g | INTRAVENOUS | Status: AC
  Administered 2017-04-01: 2 g via INTRAVENOUS
  Filled 2017-03-31: qty 100

## 2017-03-31 MED ORDER — VITAMIN B-12 1000 MCG PO TABS
1000.0000 ug | ORAL_TABLET | Freq: Every day | ORAL | Status: DC
  Administered 2017-03-31 – 2017-04-01 (×2): 1000 ug via ORAL
  Filled 2017-03-31 (×2): qty 1

## 2017-03-31 MED ORDER — POLYETHYLENE GLYCOL 3350 17 G PO PACK: 17.0000 g | PACK | Freq: Every day | ORAL | Status: DC | PRN

## 2017-03-31 NOTE — ED Triage Notes (Signed)
Pt BIB EMS for fall. Pt reports taking one baby ASA daily, denies taking blood thinners. Pt states she has been feeling unbalanced the past week and doesn't know why she fell today. Per EMS pt hit head on countertop, with possible LOC, pt was on floor approx 1 hour. Lac noted to left head, bleeding controlled. Left hip raised and bruised. Distal pulses and sensation intact. Pt A&Ox4; resp e/u; nad at this time. Pt received 100 mcg fentanyl PTA.

## 2017-03-31 NOTE — ED Notes (Signed)
Dinner tray at bedside

## 2017-03-31 NOTE — H&P (Signed)
History and Physical    Dorothy White DOB: Sep 22, 1950 DOA: 03/31/2017  PCP: Maurice Small, MD  Patient coming from:Home  Chief Complaint:fall   HPI: Dorothy White is a 66 y.o. female with medical history significant of anxiety depression, migraine, moderate degree malnutrition, TIA, carotid artery stenosis was admitted on July this year for frequent falls when workup was unremarkable presented with fall sustaining scalp laceration and hip injury. Patient reported that she was doing well until when she slipped in the kitchen and hit her head and left hip. She denied loss of consciousness, dizziness, lightheadedness, chest pain, shortness of breath, headache or dizziness. Patient reported that she feels weak in her legs for a long time. Denied fever, chills, nausea, vomiting, chest pain or shortness of breath.  The workup including echo MRI and carotid was done just more than a month ago.  ED Course: In the ER patient was found to have a scalp laceration which did not require any repair. Imaging studies consistent with closed left hip fracture therefore orthopedics was consulted. TRH was consulted for admission.  Review of Systems: As per HPI otherwise 10 point review of systems negative.    Past Medical History:  Diagnosis Date  . Anemia    ?  Marland Kitchen Anxiety   . Arthritis    "everywhere" (04/25/2016)  . 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 Chronic back pain    "all over" (04/25/2016)  . Constipation   . Depression   . Diverticulitis   . GERD (gastroesophageal reflux disease)   . Headache    "weekly" (04/25/2016)  . 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 HLD (hyperlipidemia)    hx (04/25/2016)  . 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 Mild CAD    a. 25% mLAD, otherwise no sig disease 01/2016 cath.  Marland Kitchen NICM (nonischemic cardiomyopathy) ([place])    a. EF 40-45% by echo 08/6946 at time of complicated migraine/neuro  sx, 55-65% at time of cath 01/2016  . Stroke [facility]) 06/2013   "mini" stroke , ?possibly hemaplegic migraine per pt    Past Surgical History:  Procedure Laterality Date  . AUGMENTATION MAMMAPLASTY  1980  . BASAL CELL CARCINOMA EXCISION     "tip of my nose"  . BUNIONECTOMY Bilateral 10/2003  . CARDIAC CATHETERIZATION N/A 02/20/2016   Procedure: Left Heart Cath and Coronary Angiography;  Surgeon: Jettie Booze, MD;  Location: [place] [facility];  Service: Cardiovascular;  Laterality: N/A;  . COLONOSCOPY    . DILATION AND CURETTAGE OF UTERUS    . FRACTURE SURGERY    . IR GENERIC HISTORICAL  03/18/2016   IR SINUS/FIST TUBE CHK-NON GI 03/18/2016 Aletta Edouard, MD [facility]-INTER[facility] RAD  . LAPAROSCOPIC SIGMOID COLECTOMY N/A 04/25/2016   Procedure: LAPAROSCOPIC SIGMOID COLECTOMY;  Surgeon: Stark Klein, MD;  Location: [place];  Service: General;  Laterality: N/A;  . OOPHORECTOMY Bilateral ~ 1999  . ORIF HUMERUS FRACTURE Right 02/15/2015   Procedure: OPEN REDUCTION INTERNAL FIXATION (ORIF) PROXIMAL HUMERUS FRACTURE;  Surgeon: Netta Cedars, MD;  Location: [place];  Service: Orthopedics;  Laterality: Right;  . RHINOPLASTY  1976  . TONSILLECTOMY    . TUBAL LIGATION  ~ 1983  . VAGINAL HYSTERECTOMY  ~ 1998    Social history: reports that she has been smoking Cigarettes.  She has a 34.00 pack-year smoking history. She has never used smokeless tobacco. She reports that she does not drink alcohol or  use drugs.  Allergies  Allergen Reactions  . Penicillins Hives    Has patient had a PCN reaction causing immediate rash, facial/tongue/throat swelling, SOB or lightheadedness with hypotension: Yes Has patient had a PCN reaction causing severe rash involving mucus membranes or skin necrosis: No Has patient had a PCN reaction that required hospitalization No Has patient had a PCN reaction occurring within the last 10 years: No If all of the above answers are "NO", then may proceed with Cephalosporin use.      Family History  Problem Relation Age of Onset  . Lung cancer Mother 65  . Migraines Mother   . Heart attack Father        Vague  . Hypertension Brother   . Esophageal cancer Brother   . Diabetes Paternal Grandmother        questionable  . Colon cancer Neg Hx   . Kidney disease Neg Hx   . Liver disease Neg Hx      Prior to Admission medications   Medication Sig Start Date End Date Taking? Authorizing Provider  Ascorbic Acid (VITAMIN C) 100 MG tablet Take 100 mg by mouth daily.   Yes [provider]  aspirin EC 81 MG tablet Take 1 tablet (81 mg total) by mouth daily. 02/19/17 02/19/18 Yes Debbe Odea, MD  clonazePAM (KLONOPIN) 1 MG tablet Take 1 tablet (1 mg total) by mouth daily as needed (anxiety/sleep.). 02/15/16  Yes Dorothy Spark, MD  diclofenac sodium (VOLTAREN) 1 % GEL Apply 1 application topically daily as needed for pain. 03/18/17  Yes [provider]  feeding supplement, ENSURE ENLIVE, (ENSURE ENLIVE) LIQD Take 237 mLs by mouth 2 (two) times daily between meals. 02/19/17  Yes Debbe Odea, MD  FLUoxetine (PROZAC) 20 MG capsule Take 2 capsules (40 mg total) by mouth daily. 09/27/16  Yes Rai, Ripudeep K, MD  LUTEIN PO Take 1 capsule by mouth 2 (two) times daily.    Yes [provider]  meloxicam (MOBIC) 15 MG tablet Take 15 mg by mouth daily as needed for pain. 03/28/17  Yes [provider]  mometasone (NASONEX) 50 MCG/ACT nasal spray Place 2 sprays into the nose daily.   Yes [provider]  ondansetron (ZOFRAN) 8 MG tablet Take 8 mg by mouth every 8 (eight) hours as needed for nausea or vomiting.   Yes [provider]  oxyCODONE-acetaminophen (PERCOCET) 10-325 MG tablet Take 1 tablet by mouth 3 (three) times daily as needed for pain. 03/09/17  Yes [provider]  RELPAX 40 MG tablet Take 40 mg by mouth every 2 (two) hours as needed. Take 1 tablet by mouth as needed for migraine and may take 1 additional tablet in  2 hours if no relief  01/16/16  Yes [provider]  SUBOXONE 2-0.5 MG FILM Place 1 Film under the tongue daily. 03/26/17  Yes [provider]  topiramate (TOPAMAX) 25 MG capsule Take 1 capsule (25 mg total) by mouth at bedtime. Take for 1 wk.  Increase to 50 mg QHS on week 2, 75 mg QHS on week 3, 100 mg QHS on week 4 and onward Patient taking differently: Take 50 mg by mouth 2 (two) times daily.  02/19/17  Yes Debbe Odea, MD  traZODone (DESYREL) 100 MG tablet Take 200 mg by mouth at bedtime as needed for sleep. 11/12/16  Yes [provider]  vitamin B-12 (CYANOCOBALAMIN) 1000 MCG tablet Take 1 tablet (1,000 mcg total) by mouth daily. 02/19/17  Yes  Debbe Odea, MD  vitamin E 100 UNIT capsule Take 100 Units by mouth every evening.   Yes [provider]    Physical Exam: Vitals:   03/31/17 1630 03/31/17 1645 03/31/17 1700 03/31/17 1715  BP: 107/68 115/72 113/71 114/64  Pulse: 78 73 74 75  Resp: '10 15 11 11  '$ Temp:      TempSrc:      SpO2: 98% 98% 99% 97%  Weight:      Height:          Constitutional: Patient female lying on bed comfortable. Left parietal area has superficial laceration with no active bleeding Vitals:   03/31/17 1630 03/31/17 1645 03/31/17 1700 03/31/17 1715  BP: 107/68 115/72 113/71 114/64  Pulse: 78 73 74 75  Resp: '10 15 11 11  '$ Temp:      TempSrc:      SpO2: 98% 98% 99% 97%  Weight:      Height:       Eyes: PERRL ENMT: Mucous membranes are moist.  Neck: normal, supple Respiratory: clear to auscultation bilaterally, no wheezing, no crackles. Normal respiratory effort. No accessory muscle use.  Cardiovascular: Regular rate and rhythm, no murmurs / rubs / gallops. No extremity edema.   Abdomen: Soft, nontender, nondistended. Bowel sound positive  Musculoskeletal: no clubbing / cyanosis. No lower extremity edema. Skin: no rashes, lesions, ulcers. No induration Neurologic: Alert awake, oriented 3 and following commands. Mood  is stable.   Labs on Admission: I have personally reviewed following labs and imaging studies  CBC:  Recent Labs Lab 03/31/17 1511  WBC 9.7  NEUTROABS 8.6*  HGB 14.0  HCT 43.7  MCV 87.1  PLT 992   Basic Metabolic Panel:  Recent Labs Lab 03/31/17 1511  NA 140  K 4.0  CL 107  CO2 24  GLUCOSE 97  BUN 14  CREATININE 1.03*  CALCIUM 9.4   GFR: Estimated Creatinine Clearance: 49 mL/min (A) (by C-G formula based on SCr of 1.03 mg/dL (H)). Liver Function Tests: No results for input(s): AST, ALT, ALKPHOS, BILITOT, PROT, ALBUMIN in the last 168 hours. No results for input(s): LIPASE, AMYLASE in the last 168 hours. No results for input(s): AMMONIA in the last 168 hours. Coagulation Profile: No results for input(s): INR, PROTIME in the last 168 hours. Cardiac Enzymes: No results for input(s): CKTOTAL, CKMB, CKMBINDEX, TROPONINI in the last 168 hours. BNP (last 3 results) No results for input(s): PROBNP in the last 8760 hours. HbA1C: No results for input(s): HGBA1C in the last 72 hours. CBG: No results for input(s): GLUCAP in the last 168 hours. Lipid Profile: No results for input(s): CHOL, HDL, LDLCALC, TRIG, CHOLHDL, LDLDIRECT in the last 72 hours. Thyroid Function Tests: No results for input(s): TSH, T4TOTAL, FREET4, T3FREE, THYROIDAB in the last 72 hours. Anemia Panel: No results for input(s): VITAMINB12, FOLATE, FERRITIN, TIBC, IRON, RETICCTPCT in the last 72 hours. Urine analysis:    Component Value Date/Time   COLORURINE YELLOW 02/17/2017 1550   APPEARANCEUR CLEAR 02/17/2017 1550   LABSPEC 1.020 02/17/2017 1550   PHURINE 5.0 02/17/2017 1550   GLUCOSEU NEGATIVE 02/17/2017 1550   GLUCOSEU NEGATIVE 04/13/2007 1417   HGBUR NEGATIVE 02/17/2017 1550   BILIRUBINUR NEGATIVE 02/17/2017 1550   KETONESUR NEGATIVE 02/17/2017 1550   PROTEINUR 30 (A) 02/17/2017 1550   UROBILINOGEN 0.2 02/04/2015 1800   NITRITE NEGATIVE 02/17/2017 1550   LEUKOCYTESUR NEGATIVE 02/17/2017  1550   Sepsis Labs: !!!!!!!!!!!!!!!!!!!!!!!!!!!!!!!!!!!!!!!!!!!! '@LABRCNTIP'$ (procalcitonin:4,lacticidven:4) )No results found for this or any previous visit (  from the past 240 hour(s)).   Radiological Exams on Admission: Ct Head Wo Contrast  Result Date: 03/31/2017 CLINICAL DATA:  Fall. Headache. Neck pain. Imbalance. Initial encounter. EXAM: CT HEAD WITHOUT CONTRAST CT CERVICAL SPINE WITHOUT CONTRAST TECHNIQUE: Multidetector CT imaging of the head and cervical spine was performed following the standard protocol without intravenous contrast. Multiplanar CT image reconstructions of the cervical spine were also generated. COMPARISON:  Brain MRI 02/17/2017. Head and cervical spine CT 02/17/2017. FINDINGS: CT HEAD FINDINGS Brain: There is no evidence of acute infarct, intracranial hemorrhage, mass, midline shift, or extra-axial fluid collection. Mild generalized cerebral atrophy is within normal limits for age. Vascular: Calcified atherosclerosis at the skullbase. No hyperdense vessel. Skull: No fracture or focal osseous lesion. Sinuses/Orbits: Paranasal sinuses and mastoid air cells are clear. Unremarkable orbits. Other: Small left parietal scalp hematoma. CT CERVICAL SPINE FINDINGS Alignment: Mild cervical spine straightening. Unchanged trace retrolisthesis of C6 on C7, likely degenerative. Skull base and vertebrae: No fracture destructive osseous process. Soft tissues and spinal canal: No prevertebral swelling. Disc levels: Mild lower cervical disc degeneration, greatest at C6-7 and unchanged from the prior examination. Moderate left facet arthrosis at C7-T1. Upper chest: Unremarkable. Other: None. IMPRESSION: 1. No evidence of acute intracranial abnormality. 2. Left parietal scalp hematoma. 3. No evidence of fracture or traumatic subluxation in the cervical spine. Electronically Signed   By: Logan Bores M.D.   On: 03/31/2017 16:08   Ct Cervical Spine Wo Contrast  Result Date: 03/31/2017 CLINICAL DATA:   Fall. Headache. Neck pain. Imbalance. Initial encounter. EXAM: CT HEAD WITHOUT CONTRAST CT CERVICAL SPINE WITHOUT CONTRAST TECHNIQUE: Multidetector CT imaging of the head and cervical spine was performed following the standard protocol without intravenous contrast. Multiplanar CT image reconstructions of the cervical spine were also generated. COMPARISON:  Brain MRI 02/17/2017. Head and cervical spine CT 02/17/2017. FINDINGS: CT HEAD FINDINGS Brain: There is no evidence of acute infarct, intracranial hemorrhage, mass, midline shift, or extra-axial fluid collection. Mild generalized cerebral atrophy is within normal limits for age. Vascular: Calcified atherosclerosis at the skullbase. No hyperdense vessel. Skull: No fracture or focal osseous lesion. Sinuses/Orbits: Paranasal sinuses and mastoid air cells are clear. Unremarkable orbits. Other: Small left parietal scalp hematoma. CT CERVICAL SPINE FINDINGS Alignment: Mild cervical spine straightening. Unchanged trace retrolisthesis of C6 on C7, likely degenerative. Skull base and vertebrae: No fracture destructive osseous process. Soft tissues and spinal canal: No prevertebral swelling. Disc levels: Mild lower cervical disc degeneration, greatest at C6-7 and unchanged from the prior examination. Moderate left facet arthrosis at C7-T1. Upper chest: Unremarkable. Other: None. IMPRESSION: 1. No evidence of acute intracranial abnormality. 2. Left parietal scalp hematoma. 3. No evidence of fracture or traumatic subluxation in the cervical spine. Electronically Signed   By: Logan Bores M.D.   On: 03/31/2017 16:08   Dg Hip Unilat With Pelvis 2-3 Views Left  Result Date: 03/31/2017 CLINICAL DATA:  Left hip pain after fall. EXAM: DG HIP (WITH OR WITHOUT PELVIS) 2-3V LEFT COMPARISON:  None. FINDINGS: There is an impacted subcapital fracture of the left femoral neck with mild foreshortening. The bones are osteopenic. Bowel sutures are noted in the pelvis. IMPRESSION:  Impacted, subcapital fracture of the left femoral neck. Electronically Signed   By: Titus Dubin M.D.   On: 03/31/2017 15:36    EKG: Independently reviewed. Sinus rhythm with QT prolongation.  Assessment/Plan  # Fall sustaining a left parietal scalp laceration and closed left hip fracture: -Patient with history of recurrent fall  and admitted in July this year. Patient had MRI/MRA of brain which was unremarkable. Patient is known to have left internal carotid artery stenosis. Echocardiogram with normal systolic function. Patient reported slipping in the kitchen and had a fall. She denied any prodromal symptoms. -Orthopedics was consulted, keep nothing by mouth from midnight -Start IV fluid -Check ESR, CRP, vitamin D level, liver function test, PT, INR, UA, urine drug screen -Holding aspirin because of a scalp laceration and plan for surgery tomorrow. -Given recent cardiac workup and had a cardiac cath last year which showed mild LAD lesion and normal systolic function, patient is acceptable risk for intermediate risk surgery. I think patient does not require invasive cardiac studies before this urgent nature of hip surgery.  #QT prolongation: Repeat EKG in the morning. Minimize QT prolonging medication. Admitted in telemetry. Holding Suboxone now. Pharmacy was consulted to verify the medication and dose.  #Likely peripheral neuropathy causing lower extremity weakness: Outpatient EMG was recommended in prior visit. Recommended to continue to follow up. PT OT evaluation after the surgery. Continue vitamin B12  #History of migraine: Continue Topamax. Seen by neurologist in the past.  #Likely moderate protein calorie malnutrition: Dietary referral. Continue feeding supplement.  DVT prophylaxis: SCD. No anticoagulation because of scalp laceration and plan for surgery tomorrow Code Status: Full code Family Communication: No family at bedside Disposition Plan: Admission Consults called:  Orthopedics was consulted by ER Admission status: Inpatient   Chivonne Rascon Tanna Furry MD [facility] Pager [phone]  If 7PM-7AM, please contact night-coverage [email or web address] Password [email or web address]  03/31/2017, 6:02 PM

## 2017-03-31 NOTE — ED Provider Notes (Signed)
[facility] DEPT Provider Note   CSN: [id number] Arrival date & time: 03/31/17  1416     History   Chief Complaint Chief Complaint  Patient presents with  . Fall  . Head Laceration    HPI Dorothy White is a 66 y.o. female.patient tripped on a slippery floor striking her head and left hip 2 hours prior to coming here.she felt well prior to the fall She lay on the floor for 2 hours. She complains of mild headache mild neck pain and left hip pain since the event. She is brought by EMS treated by EMS with hard cervical collar  fentanyl 100 gprior to arrival. She felt well prior to the fall. Associated symptoms include transient brief loss of consciousness and bleeding from her scalp  HPI  Past Medical History:  Diagnosis Date  . Anemia    ?  Marland Kitchen Anxiety   . Arthritis    "everywhere" (04/25/2016)  . 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 Chronic back pain    "all over" (04/25/2016)  . Constipation   . Depression   . Diverticulitis   . GERD (gastroesophageal reflux disease)   . Headache    "weekly" (04/25/2016)  . 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 HLD (hyperlipidemia)    hx (04/25/2016)  . 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 Mild CAD    a. 25% mLAD, otherwise no sig disease 01/2016 cath.  Marland Kitchen NICM (nonischemic cardiomyopathy) ([facility])    a. EF 40-45% by echo 10/979 at time of complicated migraine/neuro sx, 55-65% at time of cath 01/2016  . Stroke [facility]) 06/2013   "mini" stroke , ?possibly hemaplegic migraine per pt    Patient Active Problem List   Diagnosis Date Noted  . 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 Hx of transient ischemic attack (TIA) 02/19/2017  . Left carotid stenosis- 40-59% 02/19/2017  . Malnutrition of moderate degree 02/18/2017  . Falls 02/17/2017  . Chest pain 09/26/2016  . Diverticulitis of sigmoid colon 04/25/2016  . Infection due to ESBL-producing Escherichia coli/Diverticular Abscess  03/15/2016  . Diverticulitis 03/12/2016  . Chronic pain syndrome 03/12/2016  . Lesion of pancreas 03/12/2016  . History of chest pain 03/12/2016  . Hypokalemia 03/12/2016  . Angina, class IV ([place]) - chest tightness and pressure with dyspnea with minimal exertion 02/17/2016    Class: Question of  . TIA (transient ischemic attack) 12/26/2015  . DOE (dyspnea on exertion) 12/26/2015  . Right sided weakness 12/17/2015  . Ataxia 12/17/2015  . Shoulder fracture 02/15/2015  . Chronic headache 10/11/2013  . Weakness generalized 08/12/2013  . Dizziness and giddiness 08/12/2013  . Abnormality of gait 07/11/2013  . Hyperlipidemia 05/07/2007  . Migraine without aura 05/07/2007  . PREMATURE VENTRICULAR CONTRACTIONS, FREQUENT 05/07/2007  . GERD 05/07/2007  . DIVERTICULOSIS, COLON 05/07/2007  . 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 OSTEOPENIA 05/07/2007  . Anxiety and depression 03/24/2007  . HEMORRHOIDS 03/24/2007  . ALLERGIC RHINITIS 03/24/2007  . LOW BACK PAIN 03/24/2007  . MIGRAINES, HX OF 03/24/2007    Past Surgical History:  Procedure Laterality Date  . AUGMENTATION MAMMAPLASTY  1980  . BASAL CELL CARCINOMA EXCISION     "tip of my nose"  . BUNIONECTOMY Bilateral 10/2003  . CARDIAC CATHETERIZATION N/A 02/20/2016   Procedure: Left Heart Cath and Coronary Angiography;  Surgeon: Jettie Booze, MD;  Location: [facility];  Service: Cardiovascular;  Laterality: N/A;  . COLONOSCOPY    .  DILATION AND CURETTAGE OF UTERUS    . FRACTURE SURGERY    . IR GENERIC HISTORICAL  03/18/2016   IR SINUS/FIST TUBE CHK-NON GI 03/18/2016 Aletta Edouard, MD [facility]  . LAPAROSCOPIC SIGMOID COLECTOMY N/A 04/25/2016   Procedure: LAPAROSCOPIC SIGMOID COLECTOMY;  Surgeon: Stark Klein, MD;  Location: [place] [facility] [facility];  Service: General;  Laterality: N/A;  . OOPHORECTOMY Bilateral ~ 1999  . ORIF HUMERUS FRACTURE Right 02/15/2015   Procedure: OPEN REDUCTION INTERNAL FIXATION (ORIF) PROXIMAL HUMERUS  FRACTURE;  Surgeon: Netta Cedars, MD;  Location: [place];  Service: Orthopedics;  Laterality: Right;  . RHINOPLASTY  1976  . TONSILLECTOMY    . TUBAL LIGATION  ~ 1983  . VAGINAL HYSTERECTOMY  ~ 1998    OB History    No data available       Home Medications    Prior to Admission medications   Medication Sig Start Date End Date Taking? Authorizing Provider  aspirin EC 81 MG tablet Take 1 tablet (81 mg total) by mouth daily. 02/19/17 02/19/18  Debbe Odea, MD  clonazePAM (KLONOPIN) 1 MG tablet Take 1 tablet (1 mg total) by mouth daily as needed (anxiety/sleep.). 02/15/16   Dorothy Spark, MD  feeding supplement, ENSURE ENLIVE, (ENSURE ENLIVE) LIQD Take 237 mLs by mouth 2 (two) times daily between meals. 02/19/17   Debbe Odea, MD  FLUoxetine (PROZAC) 20 MG capsule Take 2 capsules (40 mg total) by mouth daily. 09/27/16   Rai, Ripudeep K, MD  LUTEIN PO Take 1 capsule by mouth every evening.    [provider]  mometasone (NASONEX) 50 MCG/ACT nasal spray Place 2 sprays into the nose daily.    [provider]  ondansetron (ZOFRAN) 8 MG tablet Take 8 mg by mouth every 8 (eight) hours as needed for nausea or vomiting.    [provider]  RELPAX 40 MG tablet Take 40 mg by mouth every 2 (two) hours as needed. Take 1 tablet by mouth as needed for migraine and may take 1 additional tablet in 2 hours if no relief  01/16/16   [provider]  topiramate (TOPAMAX) 25 MG capsule Take 1 capsule (25 mg total) by mouth at bedtime. Take for 1 wk.  Increase to 50 mg QHS on week 2, 75 mg QHS on week 3, 100 mg QHS on week 4 and onward 02/19/17   Debbe Odea, MD  traZODone (DESYREL) 100 MG tablet Take 200 mg by mouth at bedtime as needed for sleep. 11/12/16   [provider]  vitamin B-12 (CYANOCOBALAMIN) 1000 MCG tablet Take 1 tablet (1,000 mcg total) by mouth daily. 02/19/17   Debbe Odea, MD  vitamin E 100 UNIT capsule Take 100 Units by mouth every evening.     [provider]    Family History Family History  Problem Relation Age of Onset  . Lung cancer Mother 25  . Migraines Mother   . Heart attack Father        Vague  . Hypertension Brother   . Esophageal cancer Brother   . Diabetes Paternal Grandmother        questionable  . Colon cancer Neg Hx   . Kidney disease Neg Hx   . Liver disease Neg Hx     Social History Social History  Substance Use Topics  . Smoking status: Current Some Day Smoker    Packs/day: 1.00    Years: 34.00    Types: Cigarettes    Last attempt to quit: 02/03/2017  .  Smokeless tobacco: Never Used     Comment: 10 per week or less  . Alcohol use No     Comment: Denies 08/24/16     Allergies   Penicillins   Review of Systems Review of Systems  Constitutional: Negative.   HENT: Negative.   Respiratory: Negative.   Cardiovascular: Negative.   Gastrointestinal: Negative.   Musculoskeletal: Positive for arthralgias and neck pain.       Left hip pain  Skin: Positive for wound.       Scalp wound  Neurological: Positive for headaches.  Psychiatric/Behavioral: Negative.   All other systems reviewed and are negative.    Physical Exam Updated Vital Signs BP 111/66   Pulse 73   Temp 98.3 F (36.8 C) (Oral)   Resp 12   Ht 5\' 5"  (1.651 m)   Wt 61.2 kg (135 lb)   SpO2 98%   BMI 22.47 kg/m   Physical Exam  Constitutional: She appears well-developed and well-nourished.  HENT:  Right Ear: External ear normal.  Left Ear: External ear normal.  No facial asymmetry. There is a 0.5 cm laceration at left parietal scalp, no active bleeding  Eyes: Pupils are equal, round, and reactive to light. Conjunctivae are normal.  Neck: No tracheal deviation present. No thyromegaly present.  Mild tenderness posteriorly  Cardiovascular: Normal rate and regular rhythm.   No murmur heard. Pulmonary/Chest: Effort normal and breath sounds normal.  Abdominal: Soft. Bowel sounds are normal. She exhibits no  distension. There is no tenderness.  Musculoskeletal: Normal range of motion. She exhibits no edema or tenderness.  Pelvis stable. Thoracic and lumbar spine nontender.Left lower extremityLower extremity no deformity. She has pain at hip on internal rotation of thigh. DP pulse 2+. All other extremities without contusion abrasion or tenderness neurovascularly intact  Neurological: She is alert. Coordination normal.  Skin: Skin is warm and dry. No rash noted.  Psychiatric: She has a normal mood and affect.  Nursing note and vitals reviewed.    ED Treatments / Results  Labs (all labs ordered are listed, but only abnormal results are displayed) Labs Reviewed  CBC WITH DIFFERENTIAL/PLATELET - Abnormal; Notable for the following:       Result Value   Neutro Abs 8.6 (*)    Lymphs Abs 0.6 (*)    All other components within normal limits  BASIC METABOLIC PANEL    EKG  EKG Interpretation None       Radiology No results found.  Procedures Procedures (including critical care time)  Medications Ordered in ED Medications  fentaNYL (SUBLIMAZE) injection 100 mcg (100 mcg Intravenous Given 03/31/17 1505)   X-rays viewed by me Results for orders placed or performed during the hospital encounter of 69/62/95  Basic metabolic panel  Result Value Ref Range   Sodium 140 135 - 145 mmol/L   Potassium 4.0 3.5 - 5.1 mmol/L   Chloride 107 101 - 111 mmol/L   CO2 24 22 - 32 mmol/L   Glucose, Bld 97 65 - 99 mg/dL   BUN 14 6 - 20 mg/dL   Creatinine, Ser 1.03 (H) 0.44 - 1.00 mg/dL   Calcium 9.4 8.9 - 10.3 mg/dL   GFR calc non Af Amer 56 (L) >60 mL/min   GFR calc Af Amer >60 >60 mL/min   Anion gap 9 5 - 15  CBC with Differential/Platelet  Result Value Ref Range   WBC 9.7 4.0 - 10.5 K/uL   RBC 5.02 3.87 - 5.11 MIL/uL   Hemoglobin  14.0 12.0 - 15.0 g/dL   HCT 43.7 36.0 - 46.0 %   MCV 87.1 78.0 - 100.0 fL   MCH 27.9 26.0 - 34.0 pg   MCHC 32.0 30.0 - 36.0 g/dL   RDW 13.7 11.5 - 15.5 %    Platelets 205 150 - 400 K/uL   Neutrophils Relative % 89 %   Neutro Abs 8.6 (H) 1.7 - 7.7 K/uL   Lymphocytes Relative 6 %   Lymphs Abs 0.6 (L) 0.7 - 4.0 K/uL   Monocytes Relative 5 %   Monocytes Absolute 0.5 0.1 - 1.0 K/uL   Eosinophils Relative 0 %   Eosinophils Absolute 0.0 0.0 - 0.7 K/uL   Basophils Relative 0 %   Basophils Absolute 0.0 0.0 - 0.1 K/uL   Ct Head Wo Contrast  Result Date: 03/31/2017 CLINICAL DATA:  Fall. Headache. Neck pain. Imbalance. Initial encounter. EXAM: CT HEAD WITHOUT CONTRAST CT CERVICAL SPINE WITHOUT CONTRAST TECHNIQUE: Multidetector CT imaging of the head and cervical spine was performed following the standard protocol without intravenous contrast. Multiplanar CT image reconstructions of the cervical spine were also generated. COMPARISON:  Brain MRI 02/17/2017. Head and cervical spine CT 02/17/2017. FINDINGS: CT HEAD FINDINGS Brain: There is no evidence of acute infarct, intracranial hemorrhage, mass, midline shift, or extra-axial fluid collection. Mild generalized cerebral atrophy is within normal limits for age. Vascular: Calcified atherosclerosis at the skullbase. No hyperdense vessel. Skull: No fracture or focal osseous lesion. Sinuses/Orbits: Paranasal sinuses and mastoid air cells are clear. Unremarkable orbits. Other: Small left parietal scalp hematoma. CT CERVICAL SPINE FINDINGS Alignment: Mild cervical spine straightening. Unchanged trace retrolisthesis of C6 on C7, likely degenerative. Skull base and vertebrae: No fracture destructive osseous process. Soft tissues and spinal canal: No prevertebral swelling. Disc levels: Mild lower cervical disc degeneration, greatest at C6-7 and unchanged from the prior examination. Moderate left facet arthrosis at C7-T1. Upper chest: Unremarkable. Other: None. IMPRESSION: 1. No evidence of acute intracranial abnormality. 2. Left parietal scalp hematoma. 3. No evidence of fracture or traumatic subluxation in the cervical spine.  Electronically Signed   By: Logan Bores M.D.   On: 03/31/2017 16:08   Ct Cervical Spine Wo Contrast  Result Date: 03/31/2017 CLINICAL DATA:  Fall. Headache. Neck pain. Imbalance. Initial encounter. EXAM: CT HEAD WITHOUT CONTRAST CT CERVICAL SPINE WITHOUT CONTRAST TECHNIQUE: Multidetector CT imaging of the head and cervical spine was performed following the standard protocol without intravenous contrast. Multiplanar CT image reconstructions of the cervical spine were also generated. COMPARISON:  Brain MRI 02/17/2017. Head and cervical spine CT 02/17/2017. FINDINGS: CT HEAD FINDINGS Brain: There is no evidence of acute infarct, intracranial hemorrhage, mass, midline shift, or extra-axial fluid collection. Mild generalized cerebral atrophy is within normal limits for age. Vascular: Calcified atherosclerosis at the skullbase. No hyperdense vessel. Skull: No fracture or focal osseous lesion. Sinuses/Orbits: Paranasal sinuses and mastoid air cells are clear. Unremarkable orbits. Other: Small left parietal scalp hematoma. CT CERVICAL SPINE FINDINGS Alignment: Mild cervical spine straightening. Unchanged trace retrolisthesis of C6 on C7, likely degenerative. Skull base and vertebrae: No fracture destructive osseous process. Soft tissues and spinal canal: No prevertebral swelling. Disc levels: Mild lower cervical disc degeneration, greatest at C6-7 and unchanged from the prior examination. Moderate left facet arthrosis at C7-T1. Upper chest: Unremarkable. Other: None. IMPRESSION: 1. No evidence of acute intracranial abnormality. 2. Left parietal scalp hematoma. 3. No evidence of fracture or traumatic subluxation in the cervical spine. Electronically Signed   By: Logan Bores  M.D.   On: 03/31/2017 16:08   Dg Hip Unilat With Pelvis 2-3 Views Left  Result Date: 03/31/2017 CLINICAL DATA:  Left hip pain after fall. EXAM: DG HIP (WITH OR WITHOUT PELVIS) 2-3V LEFT COMPARISON:  None. FINDINGS: There is an impacted subcapital  fracture of the left femoral neck with mild foreshortening. The bones are osteopenic. Bowel sutures are noted in the pelvis. IMPRESSION: Impacted, subcapital fracture of the left femoral neck. Electronically Signed   By: Titus Dubin M.D.   On: 03/31/2017 15:36    Initial Impression / Assessment and Plan / ED Course  I have reviewed the triage vital signs and the nursing notes.  Pertinent labs & imaging results that were available during my care of the patient were reviewed by me and considered in my medical decision making (see chart for details).     415 p.m.Patient resting comfortably. Pain improvedand under control after treatment with distal intravenous fentanyl. Scalp laceration does not require repair I consulted Dr. Alphonzo Severance from orthopedics who will see patient in the hospital and likely operate tomorrow. I consulted hospitalist service who will arrange for admission Final Clinical Impressions(s) / ED Diagnoses  Diagnosis #1 closed left subcapital hip fracture #2 minor closed head trauma #3 scalp laceration #4 fall Final diagnoses:  None    New Prescriptions New Prescriptions   No medications on file     Orlie Dakin, MD 03/31/17 [phone]

## 2017-03-31 NOTE — ED Notes (Signed)
Patient transported to X-ray 

## 2017-03-31 NOTE — ED Notes (Signed)
Pt to be NPO at midnight. Secretary ordering dinner tray.

## 2017-03-31 NOTE — Consult Note (Signed)
ORTHOPAEDIC CONSULTATION  REQUESTING PHYSICIAN: Dr. Winfred Leeds  Chief Complaint: Left femoral neck hip fracture  HPI: Dorothy White is a 66 y.o. female who presents with left femoral neck hip fracture s/p mechanical fall PTA.  She also sustained transient LOC when she hit her head which has required staples for scalp laceration.  The patient endorses severe pain in the left hip, that does not radiate, grinding in quality, worse with any movement, better with immobilization.  Denies LOC/fever/chills/nausea/vomiting.  Walks without assistive devices (walker, cane, wheelchair).  Does live independently.  Denies LOC, neck pain, abd pain.  She has had multiple TIAs, MI, carotid stenosis.  Currently takes baby aspirin.  Past Medical History:  Diagnosis Date  . Anemia    ?  Marland Kitchen Anxiety   . Arthritis    "everywhere" (04/25/2016)  . 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 Chronic back pain    "all over" (04/25/2016)  . Constipation   . Depression   . Diverticulitis   . GERD (gastroesophageal reflux disease)   . Headache    "weekly" (04/25/2016)  . 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 HLD (hyperlipidemia)    hx (04/25/2016)  . 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 Mild CAD    a. 25% mLAD, otherwise no sig disease 01/2016 cath.  Marland Kitchen NICM (nonischemic cardiomyopathy) ([place])    a. EF 40-45% by echo 07/1029 at time of complicated migraine/neuro sx, 55-65% at time of cath 01/2016  . Stroke [facility]) 06/2013   "mini" stroke , ?possibly hemaplegic migraine per pt   Past Surgical History:  Procedure Laterality Date  . AUGMENTATION MAMMAPLASTY  1980  . BASAL CELL CARCINOMA EXCISION     "tip of my nose"  . BUNIONECTOMY Bilateral 10/2003  . CARDIAC CATHETERIZATION N/A 02/20/2016   Procedure: Left Heart Cath and Coronary Angiography;  Surgeon: Jettie Booze, MD;  Location: [facility];  Service: Cardiovascular;  Laterality: N/A;  .  COLONOSCOPY    . DILATION AND CURETTAGE OF UTERUS    . FRACTURE SURGERY    . IR GENERIC HISTORICAL  03/18/2016   IR SINUS/FIST TUBE CHK-NON GI 03/18/2016 Aletta Edouard, MD [facility]-INTERV RAD  . LAPAROSCOPIC SIGMOID COLECTOMY N/A 04/25/2016   Procedure: LAPAROSCOPIC SIGMOID COLECTOMY;  Surgeon: Stark Klein, MD;  Location: [facility];  Service: General;  Laterality: N/A;  . OOPHORECTOMY Bilateral ~ 1999  . ORIF HUMERUS FRACTURE Right 02/15/2015   Procedure: OPEN REDUCTION INTERNAL FIXATION (ORIF) PROXIMAL HUMERUS FRACTURE;  Surgeon: Netta Cedars, MD;  Location: [place];  Service: Orthopedics;  Laterality: Right;  . RHINOPLASTY  1976  . TONSILLECTOMY    . TUBAL LIGATION  ~ 1983  . VAGINAL HYSTERECTOMY  ~ 1998   Social History   Social History  . Marital status: Divorced    Spouse name: N/A  . Number of children: 0  . Years of education: 12   Occupational History  . retired Financial trader    Retired  .  [place] History Main Topics  . Smoking status: Current Some Day Smoker    Packs/day: 1.00    Years: 34.00    Types: Cigarettes    Last attempt to quit: 02/03/2017  . Smokeless tobacco: Never Used     Comment: 10 per week or less  . Alcohol use No     Comment: Denies 08/24/16  . Drug use: No  Comment: Denies 08/24/16  . Sexual activity: No   Other Topics Concern  . None   Social History Narrative   Patient lives at home alone.    Patient is retired.   Education- High school and some college   Right handed.   Patient drinks one cup of coffee and a big glass of ice tea.   Family History  Problem Relation Age of Onset  . Lung cancer Mother 32  . Migraines Mother   . Heart attack Father        Vague  . Hypertension Brother   . Esophageal cancer Brother   . Diabetes Paternal Grandmother        questionable  . Colon cancer Neg Hx   . Kidney disease Neg Hx   . Liver disease Neg Hx    Allergies  Allergen Reactions  . Penicillins Hives    Has patient had a  PCN reaction causing immediate rash, facial/tongue/throat swelling, SOB or lightheadedness with hypotension: Yes Has patient had a PCN reaction causing severe rash involving mucus membranes or skin necrosis: No Has patient had a PCN reaction that required hospitalization No Has patient had a PCN reaction occurring within the last 10 years: No If all of the above answers are "NO", then may proceed with Cephalosporin use.    Prior to Admission medications   Medication Sig Start Date End Date Taking? Authorizing Provider  Ascorbic Acid (VITAMIN C) 100 MG tablet Take 100 mg by mouth daily.   Yes [provider]  aspirin EC 81 MG tablet Take 1 tablet (81 mg total) by mouth daily. 02/19/17 02/19/18 Yes Debbe Odea, MD  clonazePAM (KLONOPIN) 1 MG tablet Take 1 tablet (1 mg total) by mouth daily as needed (anxiety/sleep.). 02/15/16  Yes Dorothy Spark, MD  diclofenac sodium (VOLTAREN) 1 % GEL Apply 1 application topically daily as needed for pain. 03/18/17  Yes [provider]  feeding supplement, ENSURE ENLIVE, (ENSURE ENLIVE) LIQD Take 237 mLs by mouth 2 (two) times daily between meals. 02/19/17  Yes Debbe Odea, MD  FLUoxetine (PROZAC) 20 MG capsule Take 2 capsules (40 mg total) by mouth daily. 09/27/16  Yes Rai, Ripudeep K, MD  LUTEIN PO Take 1 capsule by mouth 2 (two) times daily.    Yes [provider]  meloxicam (MOBIC) 15 MG tablet Take 15 mg by mouth daily as needed for pain. 03/28/17  Yes [provider]  mometasone (NASONEX) 50 MCG/ACT nasal spray Place 2 sprays into the nose daily.   Yes [provider]  ondansetron (ZOFRAN) 8 MG tablet Take 8 mg by mouth every 8 (eight) hours as needed for nausea or vomiting.   Yes [provider]  oxyCODONE-acetaminophen (PERCOCET) 10-325 MG tablet Take 1 tablet by mouth 3 (three) times daily as needed for pain. 03/09/17  Yes [provider]  RELPAX 40 MG tablet Take 40 mg by mouth every 2 (two)  hours as needed. Take 1 tablet by mouth as needed for migraine and may take 1 additional tablet in 2 hours if no relief  01/16/16  Yes [provider]  SUBOXONE 2-0.5 MG FILM Place 1 Film under the tongue daily. 03/26/17  Yes [provider]  topiramate (TOPAMAX) 25 MG capsule Take 1 capsule (25 mg total) by mouth at bedtime. Take for 1 wk.  Increase to 50 mg QHS on week 2, 75 mg QHS on week 3, 100 mg QHS on week 4 and onward Patient taking differently: Take  50 mg by mouth 2 (two) times daily.  02/19/17  Yes Debbe Odea, MD  traZODone (DESYREL) 100 MG tablet Take 200 mg by mouth at bedtime as needed for sleep. 11/12/16  Yes [provider]  vitamin B-12 (CYANOCOBALAMIN) 1000 MCG tablet Take 1 tablet (1,000 mcg total) by mouth daily. 02/19/17  Yes Debbe Odea, MD  vitamin E 100 UNIT capsule Take 100 Units by mouth every evening.   Yes [provider]   Ct Head Wo Contrast  Result Date: 03/31/2017 CLINICAL DATA:  Fall. Headache. Neck pain. Imbalance. Initial encounter. EXAM: CT HEAD WITHOUT CONTRAST CT CERVICAL SPINE WITHOUT CONTRAST TECHNIQUE: Multidetector CT imaging of the head and cervical spine was performed following the standard protocol without intravenous contrast. Multiplanar CT image reconstructions of the cervical spine were also generated. COMPARISON:  Brain MRI 02/17/2017. Head and cervical spine CT 02/17/2017. FINDINGS: CT HEAD FINDINGS Brain: There is no evidence of acute infarct, intracranial hemorrhage, mass, midline shift, or extra-axial fluid collection. Mild generalized cerebral atrophy is within normal limits for age. Vascular: Calcified atherosclerosis at the skullbase. No hyperdense vessel. Skull: No fracture or focal osseous lesion. Sinuses/Orbits: Paranasal sinuses and mastoid air cells are clear. Unremarkable orbits. Other: Small left parietal scalp hematoma. CT CERVICAL SPINE FINDINGS Alignment: Mild cervical spine straightening. Unchanged trace  retrolisthesis of C6 on C7, likely degenerative. Skull base and vertebrae: No fracture destructive osseous process. Soft tissues and spinal canal: No prevertebral swelling. Disc levels: Mild lower cervical disc degeneration, greatest at C6-7 and unchanged from the prior examination. Moderate left facet arthrosis at C7-T1. Upper chest: Unremarkable. Other: None. IMPRESSION: 1. No evidence of acute intracranial abnormality. 2. Left parietal scalp hematoma. 3. No evidence of fracture or traumatic subluxation in the cervical spine. Electronically Signed   By: Logan Bores M.D.   On: 03/31/2017 16:08   Ct Cervical Spine Wo Contrast  Result Date: 03/31/2017 CLINICAL DATA:  Fall. Headache. Neck pain. Imbalance. Initial encounter. EXAM: CT HEAD WITHOUT CONTRAST CT CERVICAL SPINE WITHOUT CONTRAST TECHNIQUE: Multidetector CT imaging of the head and cervical spine was performed following the standard protocol without intravenous contrast. Multiplanar CT image reconstructions of the cervical spine were also generated. COMPARISON:  Brain MRI 02/17/2017. Head and cervical spine CT 02/17/2017. FINDINGS: CT HEAD FINDINGS Brain: There is no evidence of acute infarct, intracranial hemorrhage, mass, midline shift, or extra-axial fluid collection. Mild generalized cerebral atrophy is within normal limits for age. Vascular: Calcified atherosclerosis at the skullbase. No hyperdense vessel. Skull: No fracture or focal osseous lesion. Sinuses/Orbits: Paranasal sinuses and mastoid air cells are clear. Unremarkable orbits. Other: Small left parietal scalp hematoma. CT CERVICAL SPINE FINDINGS Alignment: Mild cervical spine straightening. Unchanged trace retrolisthesis of C6 on C7, likely degenerative. Skull base and vertebrae: No fracture destructive osseous process. Soft tissues and spinal canal: No prevertebral swelling. Disc levels: Mild lower cervical disc degeneration, greatest at C6-7 and unchanged from the prior examination.  Moderate left facet arthrosis at C7-T1. Upper chest: Unremarkable. Other: None. IMPRESSION: 1. No evidence of acute intracranial abnormality. 2. Left parietal scalp hematoma. 3. No evidence of fracture or traumatic subluxation in the cervical spine. Electronically Signed   By: Logan Bores M.D.   On: 03/31/2017 16:08   Dg Hip Unilat With Pelvis 2-3 Views Left  Result Date: 03/31/2017 CLINICAL DATA:  Left hip pain after fall. EXAM: DG HIP (WITH OR WITHOUT PELVIS) 2-3V LEFT COMPARISON:  None. FINDINGS: There is an impacted subcapital fracture of the left femoral neck  with mild foreshortening. The bones are osteopenic. Bowel sutures are noted in the pelvis. IMPRESSION: Impacted, subcapital fracture of the left femoral neck. Electronically Signed   By: Titus Dubin M.D.   On: 03/31/2017 15:36    All pertinent xrays, MRI, CT independently reviewed and interpreted  Positive ROS: All other systems have been reviewed and were otherwise negative with the exception of those mentioned in the HPI and as above.  Physical Exam: General: Alert, no acute distress Cardiovascular: No pedal edema Respiratory: No cyanosis, no use of accessory musculature GI: No organomegaly, abdomen is soft and non-tender Skin: No lesions in the area of chief complaint Neurologic: Sensation intact distally Psychiatric: Patient is competent for consent with normal mood and affect Lymphatic: No axillary or cervical lymphadenopathy  MUSCULOSKELETAL:  - pain with movement of the hip and extremity - skin intact - NVI distally - compartments soft  Assessment: Left displaced femoral neck hip fracture  Plan: - left total hip replacement is recommended, patient is aware of r/b/a and wish to proceed - consent obtained - surgery is planned for Wednesday pending cardiac and medial optimization and risk assessment - will defer to hospitalist regarding whether a formal cards consult is necessary - patient just had echo 6 weeks  ago which showed EF 60-65%   - Based on history and fracture pattern this likely represents a fragility fracture. - Fragility fractures affect up to one half of women and one third of men after age 27 years and occur in the setting of bone disorder such as osteoporosis or osteopenia and warrant appropriate work-up. - The following are general recommendations that may serve as an outline for an appropriate work-up:  1.) Obtain bone density measurement to confirm presumptive diagnosis, assess severity of osteoporosis and risk of future fracture, and use as baseline for monitoring treatment  2.) Obtain laboratory tests: CBC, ESR, serum calcium, creatinine, albumin,phosphate, alkaline phosphatase, liver transaminases, protein electrophoresis, urinalysis, 25-hydroxyvitamin D.  3.) Exclude secondary causes of low bone mass and skeletal fragility (eg,multiple myeloma, lymphoma) as indicated.  4.) Obtain radiograph of thoracic and lumbar spine, particularly among individuals with back pain or height loss to assess presence of vertebral fractures  5.) Intermittent administration of recombinant human parathyroid hormone  6.) Optimize nutritional status using nutritional supplementation.  7.) Patient/family education to prevent future falls.  8.) Early mobilization and exercise program - exercise decreases the rate of bone loss and has been associated with decreased rate of fragility fractures   Thank you for the consult and the opportunity to see Ms. Gudgel  N. Eduard Roux, MD [place] 5:52 PM

## 2017-03-31 NOTE — ED Notes (Signed)
Pt rates pain 5/10. This RN asking pt if 5/10 pain is tolerable. Pt states it is, and that she has been going through rehab for opiates and has been clean for 3 weeks. Pt tearful that she has to take opiates during admission to manage pain after having been clean. This RN reminds pt that she is experiencing acute pain with her fractured femur, and to ask RN for pain medication when she needs it.

## 2017-04-01 ENCOUNTER — Encounter (HOSPITAL_COMMUNITY)
Admission: EM | Disposition: A | Discharge status: Skilled Nursing Facility | Payer: Self-pay | Source: Home / Self Care | Attending: Family Medicine

## 2017-04-01 ENCOUNTER — Inpatient Hospital Stay (HOSPITAL_COMMUNITY): Payer: PPO | Admitting: Certified Registered"

## 2017-04-01 ENCOUNTER — Inpatient Hospital Stay (HOSPITAL_COMMUNITY): Payer: PPO

## 2017-04-01 ENCOUNTER — Encounter (HOSPITAL_COMMUNITY): Payer: Self-pay | Admitting: Certified Registered"

## 2017-04-01 DIAGNOSIS — S72002A Fracture of unspecified part of neck of left femur, initial encounter for closed fracture: Secondary | ICD-10-CM

## 2017-04-01 DIAGNOSIS — W19XXXA Unspecified fall, initial encounter: Secondary | ICD-10-CM

## 2017-04-01 DIAGNOSIS — M12552 Traumatic arthropathy, left hip: Secondary | ICD-10-CM

## 2017-04-01 DIAGNOSIS — S0003XA Contusion of scalp, initial encounter: Secondary | ICD-10-CM

## 2017-04-01 DIAGNOSIS — Y92009 Unspecified place in unspecified non-institutional (private) residence as the place of occurrence of the external cause: Secondary | ICD-10-CM

## 2017-04-01 HISTORY — PX: ANTERIOR APPROACH HEMI HIP ARTHROPLASTY: SHX6690

## 2017-04-01 LAB — URINALYSIS, ROUTINE W REFLEX MICROSCOPIC
Bilirubin Urine: NEGATIVE
Glucose, UA: NEGATIVE mg/dL
Hgb urine dipstick: NEGATIVE
Ketones, ur: NEGATIVE mg/dL
LEUKOCYTES UA: NEGATIVE
NITRITE: NEGATIVE
PROTEIN: NEGATIVE mg/dL
SPECIFIC GRAVITY, URINE: 1.013 (ref 1.005–1.030)
pH: 7 (ref 5.0–8.0)

## 2017-04-01 LAB — SURGICAL PCR SCREEN
MRSA, PCR: NEGATIVE
Staphylococcus aureus: NEGATIVE

## 2017-04-01 LAB — CBC
HEMATOCRIT: 35.6 % — AB (ref 36.0–46.0)
Hemoglobin: 11.4 g/dL — ABNORMAL LOW (ref 12.0–15.0)
MCH: 28.2 pg (ref 26.0–34.0)
MCHC: 32 g/dL (ref 30.0–36.0)
MCV: 88.1 fL (ref 78.0–100.0)
Platelets: 180 10*3/uL (ref 150–400)
RBC: 4.04 MIL/uL (ref 3.87–5.11)
RDW: 13.6 % (ref 11.5–15.5)
WBC: 8 10*3/uL (ref 4.0–10.5)

## 2017-04-01 LAB — RAPID URINE DRUG SCREEN, HOSP PERFORMED
Amphetamines: POSITIVE — AB
Barbiturates: NOT DETECTED
Benzodiazepines: POSITIVE — AB
COCAINE: NOT DETECTED
Opiates: NOT DETECTED
TETRAHYDROCANNABINOL: NOT DETECTED

## 2017-04-01 LAB — CREATININE, SERUM
Creatinine, Ser: 0.79 mg/dL (ref 0.44–1.00)
GFR calc Af Amer: >60 mL/min (ref >60)
GFR calc non Af Amer: >60 mL/min (ref >60)

## 2017-04-01 LAB — VITAMIN D 25 HYDROXY (VIT D DEFICIENCY, FRACTURES): VIT D 25 HYDROXY: 35.5 ng/mL (ref 30.0–100.0)

## 2017-04-01 SURGERY — HEMIARTHROPLASTY, HIP, DIRECT ANTERIOR APPROACH, FOR FRACTURE
Anesthesia: Spinal | Laterality: Left

## 2017-04-01 MED ORDER — PROPOFOL 1000 MG/100ML IV EMUL
INTRAVENOUS | Status: AC
  Filled 2017-04-01: qty 100

## 2017-04-01 MED ORDER — OXYCODONE-ACETAMINOPHEN 5-325 MG PO TABS: 1.0000 | ORAL_TABLET | ORAL | 0 refills | Status: DC | PRN

## 2017-04-01 MED ORDER — PROPOFOL 10 MG/ML IV BOLUS
INTRAVENOUS | Status: DC | PRN
  Administered 2017-04-01: 35 mg via INTRAVENOUS

## 2017-04-01 MED ORDER — SODIUM CHLORIDE 0.9 % IV SOLN
INTRAVENOUS | Status: DC
  Administered 2017-04-01: 15:00:00 via INTRAVENOUS

## 2017-04-01 MED ORDER — MORPHINE SULFATE (PF) 4 MG/ML IV SOLN
0.5000 mg | INTRAVENOUS | Status: DC | PRN
  Filled 2017-04-01: qty 1

## 2017-04-01 MED ORDER — LACTATED RINGERS IV SOLN
INTRAVENOUS | Status: DC
  Administered 2017-04-01 (×2): via INTRAVENOUS

## 2017-04-01 MED ORDER — OXYCODONE HCL 5 MG PO TABS
5.0000 mg | ORAL_TABLET | ORAL | Status: DC | PRN
  Administered 2017-04-02 (×2): 10 mg via ORAL
  Administered 2017-04-02: 5 mg via ORAL
  Administered 2017-04-03 – 2017-04-04 (×3): 10 mg via ORAL
  Administered 2017-04-04 – 2017-04-05 (×2): 5 mg via ORAL
  Administered 2017-04-05 – 2017-04-06 (×3): 10 mg via ORAL
  Filled 2017-04-01 (×3): qty 2
  Filled 2017-04-01: qty 1
  Filled 2017-04-01 (×4): qty 2
  Filled 2017-04-01: qty 1
  Filled 2017-04-01 (×2): qty 2

## 2017-04-01 MED ORDER — DEXAMETHASONE SODIUM PHOSPHATE 10 MG/ML IJ SOLN
INTRAMUSCULAR | Status: DC | PRN
  Administered 2017-04-01: 10 mg via INTRAVENOUS

## 2017-04-01 MED ORDER — FENTANYL CITRATE (PF) 100 MCG/2ML IJ SOLN
INTRAMUSCULAR | Status: DC | PRN
  Administered 2017-04-01: 50 ug via INTRAVENOUS

## 2017-04-01 MED ORDER — HYDROMORPHONE HCL 1 MG/ML IJ SOLN: 0.2500 mg | INTRAMUSCULAR | Status: DC | PRN

## 2017-04-01 MED ORDER — DEXAMETHASONE SODIUM PHOSPHATE 10 MG/ML IJ SOLN
INTRAMUSCULAR | Status: AC
  Filled 2017-04-01: qty 1

## 2017-04-01 MED ORDER — MIDAZOLAM HCL 5 MG/5ML IJ SOLN
INTRAMUSCULAR | Status: DC | PRN
  Administered 2017-04-01: .5 mg via INTRAVENOUS

## 2017-04-01 MED ORDER — ACETAMINOPHEN 650 MG RE SUPP: 650.0000 mg | Freq: Four times a day (QID) | RECTAL | Status: DC | PRN

## 2017-04-01 MED ORDER — TRANEXAMIC ACID 1000 MG/10ML IV SOLN
2000.0000 mg | INTRAVENOUS | Status: AC
  Administered 2017-04-01: 2000 mg via TOPICAL
  Filled 2017-04-01: qty 20

## 2017-04-01 MED ORDER — HYDROCODONE-ACETAMINOPHEN 5-325 MG PO TABS
1.0000 | ORAL_TABLET | Freq: Four times a day (QID) | ORAL | Status: DC | PRN
  Administered 2017-04-02 – 2017-04-03 (×2): 1 via ORAL
  Administered 2017-04-04 – 2017-04-06 (×5): 2 via ORAL
  Filled 2017-04-01: qty 1
  Filled 2017-04-01 (×5): qty 2
  Filled 2017-04-01: qty 1
  Filled 2017-04-01: qty 2

## 2017-04-01 MED ORDER — DEXTROSE 5 % IV SOLN: 500.0000 mg | Freq: Four times a day (QID) | INTRAVENOUS | Status: DC | PRN

## 2017-04-01 MED ORDER — VITAMIN B-12 1000 MCG PO TABS
1000.0000 ug | ORAL_TABLET | Freq: Once | ORAL | Status: AC
  Administered 2017-04-01: 1000 ug via ORAL
  Filled 2017-04-01: qty 1

## 2017-04-01 MED ORDER — PROPOFOL 500 MG/50ML IV EMUL
INTRAVENOUS | Status: DC | PRN
  Administered 2017-04-01: 75 ug/kg/min via INTRAVENOUS

## 2017-04-01 MED ORDER — PROMETHAZINE HCL 25 MG/ML IJ SOLN: 6.2500 mg | INTRAMUSCULAR | Status: DC | PRN

## 2017-04-01 MED ORDER — METOCLOPRAMIDE HCL 5 MG/ML IJ SOLN: 5.0000 mg | Freq: Three times a day (TID) | INTRAMUSCULAR | Status: DC | PRN

## 2017-04-01 MED ORDER — METHOCARBAMOL 500 MG PO TABS
500.0000 mg | ORAL_TABLET | Freq: Four times a day (QID) | ORAL | Status: DC | PRN
  Administered 2017-04-02 – 2017-04-05 (×6): 500 mg via ORAL
  Filled 2017-04-01 (×6): qty 1

## 2017-04-01 MED ORDER — MIDAZOLAM HCL 2 MG/2ML IJ SOLN
INTRAMUSCULAR | Status: AC
  Filled 2017-04-01: qty 2

## 2017-04-01 MED ORDER — ENOXAPARIN SODIUM 40 MG/0.4ML ~~LOC~~ SOLN
40.0000 mg | SUBCUTANEOUS | Status: DC
  Administered 2017-04-02 – 2017-04-06 (×5): 40 mg via SUBCUTANEOUS
  Filled 2017-04-01 (×5): qty 0.4

## 2017-04-01 MED ORDER — PHENYLEPHRINE HCL 10 MG/ML IJ SOLN
INTRAMUSCULAR | Status: DC | PRN
  Administered 2017-04-01: 80 ug via INTRAVENOUS
  Administered 2017-04-01: 40 ug via INTRAVENOUS
  Administered 2017-04-01 (×2): 80 ug via INTRAVENOUS
  Administered 2017-04-01: 40 ug via INTRAVENOUS
  Administered 2017-04-01: 80 ug via INTRAVENOUS

## 2017-04-01 MED ORDER — ALUM & MAG HYDROXIDE-SIMETH 200-200-20 MG/5ML PO SUSP: 30.0000 mL | ORAL | Status: DC | PRN

## 2017-04-01 MED ORDER — ONDANSETRON HCL 4 MG/2ML IJ SOLN
INTRAMUSCULAR | Status: AC
  Filled 2017-04-01: qty 2

## 2017-04-01 MED ORDER — ONDANSETRON HCL 4 MG/2ML IJ SOLN: 4.0000 mg | Freq: Four times a day (QID) | INTRAMUSCULAR | Status: DC | PRN

## 2017-04-01 MED ORDER — METOCLOPRAMIDE HCL 5 MG PO TABS: 5.0000 mg | ORAL_TABLET | Freq: Three times a day (TID) | ORAL | Status: DC | PRN

## 2017-04-01 MED ORDER — PROPOFOL 10 MG/ML IV BOLUS
INTRAVENOUS | Status: AC
  Filled 2017-04-01: qty 20

## 2017-04-01 MED ORDER — ENOXAPARIN SODIUM 40 MG/0.4ML ~~LOC~~ SOLN: 40.0000 mg | Freq: Every day | SUBCUTANEOUS | 0 refills | Status: DC

## 2017-04-01 MED ORDER — PHENYLEPHRINE 40 MCG/ML (10ML) SYRINGE FOR IV PUSH (FOR BLOOD PRESSURE SUPPORT)
PREFILLED_SYRINGE | INTRAVENOUS | Status: AC
  Filled 2017-04-01: qty 10

## 2017-04-01 MED ORDER — CEFAZOLIN SODIUM-DEXTROSE 2-4 GM/100ML-% IV SOLN
2.0000 g | Freq: Four times a day (QID) | INTRAVENOUS | Status: DC
  Filled 2017-04-01 (×2): qty 100

## 2017-04-01 MED ORDER — ONDANSETRON HCL 4 MG/2ML IJ SOLN
INTRAMUSCULAR | Status: DC | PRN
  Administered 2017-04-01: 4 mg via INTRAVENOUS

## 2017-04-01 MED ORDER — FENTANYL CITRATE (PF) 250 MCG/5ML IJ SOLN
INTRAMUSCULAR | Status: AC
  Filled 2017-04-01: qty 5

## 2017-04-01 MED ORDER — ACETAMINOPHEN 325 MG PO TABS
650.0000 mg | ORAL_TABLET | Freq: Four times a day (QID) | ORAL | Status: DC | PRN
  Administered 2017-04-04 – 2017-04-06 (×4): 650 mg via ORAL
  Filled 2017-04-01 (×5): qty 2

## 2017-04-01 MED ORDER — CEFAZOLIN SODIUM-DEXTROSE 2-4 GM/100ML-% IV SOLN
2.0000 g | Freq: Four times a day (QID) | INTRAVENOUS | Status: AC
  Administered 2017-04-01 – 2017-04-02 (×3): 2 g via INTRAVENOUS
  Filled 2017-04-01 (×3): qty 100

## 2017-04-01 MED ORDER — PHENOL 1.4 % MT LIQD: 1.0000 | OROMUCOSAL | Status: DC | PRN

## 2017-04-01 MED ORDER — ONDANSETRON HCL 4 MG PO TABS: 4.0000 mg | ORAL_TABLET | Freq: Four times a day (QID) | ORAL | Status: DC | PRN

## 2017-04-01 MED ORDER — MENTHOL 3 MG MT LOZG: 1.0000 | LOZENGE | OROMUCOSAL | Status: DC | PRN

## 2017-04-01 MED ORDER — BUPIVACAINE HCL (PF) 0.75 % IJ SOLN
INTRAMUSCULAR | Status: DC | PRN
  Administered 2017-04-01: 1.6 mL via INTRATHECAL

## 2017-04-01 SURGICAL SUPPLY — 53 items
BAG DECANTER FOR FLEXI CONT (MISCELLANEOUS) ×3 IMPLANT
CAPT HIP TOTAL 2 ×3 IMPLANT
CELLS DAT CNTRL 66122 CELL SVR (MISCELLANEOUS) ×1 IMPLANT
CLOSURE WOUND 1/2 X4 (GAUZE/BANDAGES/DRESSINGS) ×1
COVER SURGICAL LIGHT HANDLE (MISCELLANEOUS) ×3 IMPLANT
DRAPE C-ARM 42X72 X-RAY (DRAPES) ×3 IMPLANT
DRAPE STERI IOBAN 125X83 (DRAPES) ×3 IMPLANT
DRAPE U-SHAPE 47X51 STRL (DRAPES) ×6 IMPLANT
DRSG AQUACEL AG ADV 3.5X10 (GAUZE/BANDAGES/DRESSINGS) ×3 IMPLANT
DURAPREP 26ML APPLICATOR (WOUND CARE) ×3 IMPLANT
ELECT BLADE 4.0 EZ CLEAN MEGAD (MISCELLANEOUS) ×3
ELECT REM PT RETURN 9FT ADLT (ELECTROSURGICAL) ×3
ELECTRODE BLDE 4.0 EZ CLN MEGD (MISCELLANEOUS) ×1 IMPLANT
ELECTRODE REM PT RTRN 9FT ADLT (ELECTROSURGICAL) ×1 IMPLANT
GLOVE BIO SURGEON STRL SZ 6.5 (GLOVE) ×4 IMPLANT
GLOVE BIO SURGEON STRL SZ7 (GLOVE) ×3 IMPLANT
GLOVE BIO SURGEONS STRL SZ 6.5 (GLOVE) ×2
GLOVE BIOGEL PI IND STRL 7.0 (GLOVE) ×1 IMPLANT
GLOVE BIOGEL PI IND STRL 7.5 (GLOVE) ×1 IMPLANT
GLOVE BIOGEL PI INDICATOR 7.0 (GLOVE) ×2
GLOVE BIOGEL PI INDICATOR 7.5 (GLOVE) ×2
GLOVE SKINSENSE NS SZ7.5 (GLOVE) ×4
GLOVE SKINSENSE STRL SZ7.5 (GLOVE) ×2 IMPLANT
GLOVE SURG SYN 7.5  E (GLOVE) ×8
GLOVE SURG SYN 7.5 E (GLOVE) ×4 IMPLANT
GOWN SRG XL XLNG 56XLVL 4 (GOWN DISPOSABLE) ×1 IMPLANT
GOWN STRL NON-REIN XL XLG LVL4 (GOWN DISPOSABLE) ×3
GOWN STRL REUS W/ TWL LRG LVL3 (GOWN DISPOSABLE) IMPLANT
GOWN STRL REUS W/TWL LRG LVL3 (GOWN DISPOSABLE)
HANDPIECE INTERPULSE COAX TIP (DISPOSABLE) ×3
HOOD PEEL AWAY FLYTE STAYCOOL (MISCELLANEOUS) ×6 IMPLANT
IV NS IRRIG 3000ML ARTHROMATIC (IV SOLUTION) ×3 IMPLANT
KIT BASIN OR (CUSTOM PROCEDURE TRAY) ×3 IMPLANT
MARKER SKIN DUAL TIP RULER LAB (MISCELLANEOUS) ×3 IMPLANT
PACK TOTAL JOINT (CUSTOM PROCEDURE TRAY) ×3 IMPLANT
PACK UNIVERSAL I (CUSTOM PROCEDURE TRAY) ×3 IMPLANT
RTRCTR WOUND ALEXIS 18CM MED (MISCELLANEOUS) ×3
RTRCTR WOUND ALEXIS 18CM SML (INSTRUMENTS) ×3
SAVER CELL AAL HAEMONETICS (INSTRUMENTS) ×1 IMPLANT
SAW OSC TIP CART 19.5X105X1.3 (SAW) ×3 IMPLANT
SCREW 6.5MMX25MM (Screw) ×3 IMPLANT
SET HNDPC FAN SPRY TIP SCT (DISPOSABLE) ×1 IMPLANT
STAPLER VISISTAT 35W (STAPLE) IMPLANT
STRIP CLOSURE SKIN 1/2X4 (GAUZE/BANDAGES/DRESSINGS) ×2 IMPLANT
SUT ETHIBOND 2 V 37 (SUTURE) ×3 IMPLANT
SUT MNCRL AB 3-0 PS2 18 (SUTURE) ×3 IMPLANT
SUT VIC AB 1 CT1 27 (SUTURE) ×3
SUT VIC AB 1 CT1 27XBRD ANBCTR (SUTURE) ×1 IMPLANT
SUT VIC AB 2-0 CT1 27 (SUTURE) ×3
SUT VIC AB 2-0 CT1 TAPERPNT 27 (SUTURE) ×1 IMPLANT
TOWEL OR 17X26 10 PK STRL BLUE (TOWEL DISPOSABLE) ×3 IMPLANT
TRAY CATH 16FR W/PLASTIC CATH (SET/KITS/TRAYS/PACK) ×3 IMPLANT
YANKAUER SUCT BULB TIP NO VENT (SUCTIONS) ×3 IMPLANT

## 2017-04-01 NOTE — Discharge Instructions (Signed)
° ° °  1. Change dressings as needed °2. May shower but keep incisions covered and dry °3. Take lovenox to prevent blood clots °4. Take stool softeners as needed °5. Take pain meds as needed ° °

## 2017-04-01 NOTE — Progress Notes (Signed)
Pt unable to void entire shift. Bladder scanned completed with retention of 859ml of urine. Dr.Opyd contacted, foley requested.  Order received to insert foley for acute urinary retention.

## 2017-04-01 NOTE — H&P (Signed)

## 2017-04-01 NOTE — Op Note (Signed)
LEFT DIRECT ANTERIOR TOTAL HIP REPLACEMENT  Procedure Note Dorothy White   [id number]  Pre-op Diagnosis: Left femoral neck fracture     Post-op Diagnosis: same   Operative Procedures  1. Total hip replacement; Left hip; uncemented [id number]   Personnel  Surgeon(s): Leandrew Koyanagi, MD   Anesthesia: spinal  Prosthesis: Depuy Acetabulum: Pinnacle 52 mm Femur: Corail KA 12 Head: 36 mm size: +8.5 Liner: neutral Bearing Type: ceramic on poly  Total Hip Arthroplasty (Anterior Approach) Op Note:  After informed consent was obtained and the operative extremity marked in the holding area, the patient was brought back to the operating room and placed supine on the HANA table. Next, the operative extremity was prepped and draped in normal sterile fashion. Surgical timeout occurred verifying patient identification, surgical site, surgical procedure and administration of antibiotics.  A modified anterior Smith-Peterson approach to the hip was performed, using the interval between tensor fascia lata and sartorius.  Dissection was carried bluntly down onto the anterior hip capsule. The lateral femoral circumflex vessels were identified and coagulated. A capsulotomy was performed and the capsular flaps tagged for later repair.  Fluoroscopy was utilized to prepare for the femoral neck cut. The neck osteotomy was performed. The femoral head was removed, the acetabular rim was cleared of soft tissue and attention was turned to reaming the acetabulum.  Sequential reaming was performed under fluoroscopic guidance. We reamed to a size 51 mm, and then impacted the acetabular shell. The liner was then placed after irrigation and attention turned to the femur.  After placing the femoral hook, the leg was taken to externally rotated, extended and adducted position taking care to perform soft tissue releases to allow for adequate mobilization of the femur. Soft tissue was cleared from the shoulder of the  greater trochanter and the hook elevator used to improve exposure of the proximal femur. Sequential broaching performed up to a size 12. Trial neck and head were placed. The leg was brought back up to neutral and the construct reduced. The position and sizing of components, offset and leg lengths were checked using fluoroscopy. Stability of the  construct was checked in extension and external rotation without any subluxation or impingement of prosthesis. We dislocated the prosthesis, dropped the leg back into position, removed trial components, and irrigated copiously. The final stem and head was then placed, the leg brought back up, the system reduced and fluoroscopy used to verify positioning.  We irrigated, obtained hemostasis and closed the capsule using #2 ethibond suture.  Dilute betadyne solution was used. The fascia was closed with #1 vicryl plus, the deep fat layer was closed with 0 vicryl, the subcutaneous layers closed with 2.0 Vicryl Plus and the skin closed with 3.0 monocryl and steri strips. A sterile dressing was applied. The patient was awakened in the operating room and taken to recovery in stable condition.  All sponge, needle, and instrument counts were correct at the end of the case.   Position: supine  Complications: none.  Time Out: performed   Drains/Packing: none  Estimated blood loss: 300 cc  Returned to Recovery Room: in good condition.   Antibiotics: yes   Mechanical VTE (DVT) Prophylaxis: sequential compression devices, [name] thigh-high  Chemical VTE (DVT) Prophylaxis: lovenox   Fluid Replacement: see anesthesia record  Specimens Removed: 1 to pathology   Sponge and Instrument Count Correct? yes   PACU: portable radiograph - low AP   Admission: inpatient status  Plan/RTC: Return in 2 weeks for  staple removal. Weight Bearing/Load Lower Extremity: full  Hip precautions: none Suture Removal: 10-14 days  Betadine to incision twice daily once dressing is removed  on POD#7  N. Eduard Roux, MD [place] 12:39 PM      Implant Name Type Inv. Item Serial No. Manufacturer Lot No. LRB No. Used  PIN SECTOR W/GRIP ACE CUP 52MM - [id number] Hips PIN SECTOR W/GRIP ACE CUP 52MM  DEPUY SYNTHES  Left 1  SCREW 6.5MMX25MM - [id number] Screw SCREW 6.5MMX25MM  DEPUY SYNTHES [id number] Left 1  SCREW 6.5MMX25MM - [id number] Screw SCREW 6.5MMX25MM  DEPUY SYNTHES  Left 1  STEM CORAIL KA12 - [id number] Stem STEM CORAIL KA12  DEPUY SYNTHES [id number] Left 1  HEAD CERAMIC 36 PLUS 8.5 12 14  - [id number] Hips HEAD CERAMIC 36 PLUS 8.5 12 14    DEPUY SYNTHES [id number] Left 1

## 2017-04-01 NOTE — Anesthesia Procedure Notes (Signed)
Spinal  Patient location during procedure: OR Start time: 04/01/2017 10:55 AM End time: 04/01/2017 11:08 AM Staffing Anesthesiologist: Rica Koyanagi Performed: anesthesiologist  Preanesthetic Checklist Completed: patient identified, site marked, surgical consent, pre-op evaluation, timeout performed, IV checked, risks and benefits discussed and monitors and equipment checked Spinal Block Patient position: left lateral decubitus Prep: DuraPrep Patient monitoring: heart rate, cardiac monitor, continuous pulse ox and blood pressure Approach: left paramedian Location: L3-4 Injection technique: single-shot Needle Needle type: Quincke  Needle gauge: 24 G Needle length: 9 cm Needle insertion depth: 4 cm Assessment Sensory level: T6

## 2017-04-01 NOTE — Anesthesia Procedure Notes (Signed)
Procedure Name: MAC Date/Time: 04/01/2017 11:03 AM Performed by: Barrington Ellison Pre-anesthesia Checklist: Patient identified, Emergency Drugs available, Suction available, Patient being monitored and Timeout performed Patient Re-evaluated:Patient Re-evaluated prior to induction Oxygen Delivery Method: Simple face mask

## 2017-04-01 NOTE — Anesthesia Postprocedure Evaluation (Signed)
Anesthesia Post Note  Patient: Dorothy White  Procedure(s) Performed: Procedure(s) (LRB): LEFT DIRECT ANTERIOR TOTAL HIP REPLACEMENT (Left)     Patient location during evaluation: PACU Anesthesia Type: Spinal Level of consciousness: oriented and awake and alert Pain management: pain level controlled Vital Signs Assessment: post-procedure vital signs reviewed and stable Respiratory status: spontaneous breathing, respiratory function stable and patient connected to nasal cannula oxygen Cardiovascular status: blood pressure returned to baseline and stable Postop Assessment: no headache and no backache Anesthetic complications: no    Last Vitals:  Vitals:   04/01/17 1401 04/01/17 1432  BP: (!) 102/50 (!) 99/51  Pulse: 67 67  Resp: 14 18  Temp:  36.6 C  SpO2: 99% 97%    Last Pain:  Vitals:   04/01/17 1432  TempSrc: Oral  PainSc:                  Evamarie Raetz,JAMES TERRILL

## 2017-04-01 NOTE — Progress Notes (Signed)
Initial Nutrition Assessment  DOCUMENTATION CODES:   Not applicable  INTERVENTION:  Once diet advances, provide Ensure Enlive po BID, each supplement provides 350 kcal and 20 grams of protein.  NUTRITION DIAGNOSIS:   Increased nutrient needs related to  (post op healing) as evidenced by estimated needs.  GOAL:   Patient will meet greater than or equal to 90% of their needs  MONITOR:   Diet advancement, Weight trends, Supplement acceptance, Labs, Skin, I & O's  REASON FOR ASSESSMENT:   Consult Assessment of nutrition requirement/status  ASSESSMENT:   66 y.o. female with medical history significant of anxiety depression, migraine, moderate degree malnutrition, TIA, carotid artery stenosis was admitted on July this year for frequent falls when workup was unremarkable presented with fall sustaining scalp laceration and hip injury. Imaging studies consistent with closed left hip fracture  Pt is currently unavailable, in OR, during attempted time of visit. Weight has been stable per weight records. Pt currently has Ensure ordered. RD to continue with orders to aid in post op healing. Unable to complete Nutrition-Focused physical exam at this time.   Labs and medications reviewed.   Diet Order:  Diet NPO time specified Except for: Sips with Meds  Skin:   (laceration to head)  Last BM:  9/4  Height:   Ht Readings from Last 1 Encounters:  04/01/17 5\' 5"  (1.651 m)    Weight:   Wt Readings from Last 1 Encounters:  04/01/17 124 lb 9.6 oz (56.5 kg)    Ideal Body Weight:  56.8 kg  BMI:  Body mass index is 20.73 kg/m.  Estimated Nutritional Needs:   Kcal:  1600-1800  Protein:  70-80 grams  Fluid:  1.6 - 1.8 L/day  EDUCATION NEEDS:   No education needs identified at this time  Corrin Parker, MS, RD, LDN Pager # [phone] After hours/ weekend pager # [phone]

## 2017-04-01 NOTE — Anesthesia Preprocedure Evaluation (Addendum)
Anesthesia Evaluation  Patient identified by MRN, date of birth, ID band Patient awake    Reviewed: Allergy & Precautions, NPO status , Patient's Chart, lab work & pertinent test results  Airway Mallampati: II  TM Distance: >3 FB Neck ROM: Full    Dental  (+) Teeth Intact   Pulmonary Current Smoker,    breath sounds clear to auscultation       Cardiovascular + CAD and + DOE   Rhythm:Regular Rate:Normal     Neuro/Psych TIACVA    GI/Hepatic GERD  ,  Endo/Other    Renal/GU      Musculoskeletal  (+) Arthritis ,   Abdominal (+) + obese,   Peds  Hematology   Anesthesia Other Findings   Reproductive/Obstetrics                            Anesthesia Physical Anesthesia Plan  ASA: III  Anesthesia Plan: Spinal   Post-op Pain Management:    Induction: Intravenous  PONV Risk Score and Plan: 2 and Ondansetron and Dexamethasone  Airway Management Planned: Natural Airway and Simple Face Mask  Additional Equipment:   Intra-op Plan:   Post-operative Plan:   Informed Consent: I have reviewed the patients History and Physical, chart, labs and discussed the procedure including the risks, benefits and alternatives for the proposed anesthesia with the patient or authorized representative who has indicated his/her understanding and acceptance.     Plan Discussed with: CRNA  Anesthesia Plan Comments:         Anesthesia Quick Evaluation

## 2017-04-01 NOTE — Transfer of Care (Signed)
Immediate Anesthesia Transfer of Care Note  Patient: Dorothy White  Procedure(s) Performed: Procedure(s) with comments: LEFT DIRECT ANTERIOR TOTAL HIP REPLACEMENT (Left) - LEFT DIRECT ANTERIOR TOTAL HIP REPLACEMENT  Patient Location: PACU  Anesthesia Type:MAC and Spinal  Level of Consciousness: drowsy and patient cooperative  Airway & Oxygen Therapy: Patient Spontanous Breathing  Post-op Assessment: Report given to RN  Post vital signs: Reviewed and stable  Last Vitals:  Vitals:   03/31/17 2110 04/01/17 0404  BP: (!) 123/40 108/70  Pulse: 80 81  Resp: 13 13  Temp: 36.5 C 36.5 C  SpO2: 100% 97%    Last Pain:  Vitals:   04/01/17 0404  TempSrc: Oral  PainSc:       Patients Stated Pain Goal: 0 (63/84/66 5993)  Complications: No apparent anesthesia complications

## 2017-04-01 NOTE — Progress Notes (Signed)
PROGRESS NOTE  Dorothy White  DXA:[id number] DOB: [birth year] DOA: 03/31/2017 PCP: Maurice Small, MD   Brief Narrative: Dorothy White is a 66 y.o. female with a history of depression, anxiety, migraine, moderate malnutrition, TIA, carotid artery stenosis and frequent falls who presented with another fall, having slipped in the kitchen, striking head and landing on left side with immediate left hip pain. Fall work up during recent admission in July 2018 with echo, MRI, and carotid imaging was unremarkable. On arrival she had a scalp laceration not requiring repair and XR revealed left hip fracture. She was admitted and underwent left THA with Dr. Erlinda Hong 9/5.   Assessment & Plan: Active Problems:   Closed fracture of neck of left femur (HCC)   Scalp contusion   Fall at home, initial encounter   Hip fracture [facility])  Closed left hip fracture: s/p left THA 9/5 with Dr. Erlinda Hong - [place] No[place], will have dressing removed on POD #7, then staple removal on POD #10-14. Apply betadine to incision twice daily after dressing removal.  - Vit D normal at 35.5.  Frequent falls: Patient with history of recurrent fall and admitted in July this year. Patient had MRI/MRA of brain which was unremarkable. Patient is known to have left internal carotid artery stenosis. Echocardiogram with normal systolic function, cardiac cath 2017 showed mild LAD lesion. No prodromal symptoms/LOC. - Had negative CT head at admission - PT/OT evaluation  QT prolongation: Resolved, QTc 424msec 9/5.  - Holding suboxone for this initially, will need continued QT monitoring once restarted.  History of substance abuse, opioid use disorder:  - Holding suboxone in light of need for full opioid agonist perioperatively.  - UDS +amphetamines (no known prescriptions would cause this) and benzodiazepines (Rx klonopin)  Likely peripheral neuropathy causing lower extremity weakness:  - Outpatient EMG was recommended in prior visit. Recommended to  continue to follow up. Continue vitamin B12  History of migraine: Seen by neurologist in the past, has hemiplegic symptoms with these.  - Continue topamax.  Moderate protein calorie malnutrition:  - Nutrition consulted - Continue feeding supplement.  DVT prophylaxis: Lovenox per orthopedics Code Status: Full Family Communication: None at bedside Disposition Plan: Therapy evaluations pending.  Consultants:   Orthopedics, Dr. Erlinda Hong  Procedures:   04/01/2017, Dr. Erlinda Hong: LEFT DIRECT ANTERIOR TOTAL HIP REPLACEMENT   Antimicrobials:  None   Subjective: Pain controlled post op.  Objective: BP (!) 102/50   Pulse 67   Temp 97.7 F (36.5 C) (Oral)   Resp 14   Ht 5\' 5"  (1.651 m)   Wt 56.5 kg (124 lb 9.6 oz)   SpO2 99%   BMI 20.73 kg/m   Gen: 66 y.o. female in no distress  Pulm: Non-labored breathing room air. Clear to auscultation bilaterally.  CV: Regular rate and rhythm. No murmur, rub, or gallop. No JVD, no pedal edema. GI: Abdomen soft, non-tender, non-distended, with normoactive bowel sounds. No organomegaly or masses felt. Ext: Warm, no deformities. Left thigh compartment soft, sensorimotor exam normal, cap refill brisk.  Skin: Left hip dressing c/d/i Neuro: Alert and oriented. No focal neurological deficits. Psych: Judgement and insight appear normal. Mood & affect appropriate.   Data Reviewed: I have personally reviewed following labs and imaging studies  CBC:  Recent Labs Lab 03/31/17 1511  WBC 9.7  NEUTROABS 8.6*  HGB 14.0  HCT 43.7  MCV 87.1  PLT 209   Basic Metabolic Panel:  Recent Labs Lab 03/31/17 1511  NA 140  K 4.0  CL 107  CO2 24  GLUCOSE 97  BUN 14  CREATININE 1.03*  CALCIUM 9.4   GFR: Estimated Creatinine Clearance: 48.6 mL/min (A) (by C-G formula based on SCr of 1.03 mg/dL (H)). Liver Function Tests:  Recent Labs Lab 03/31/17 1835  AST 15  ALT 15  ALKPHOS 94  BILITOT 0.8  PROT 6.8  ALBUMIN 3.9   Coagulation  Profile:  Recent Labs Lab 03/31/17 1835  INR 0.97   Urine analysis:    Component Value Date/Time   COLORURINE YELLOW 03/31/2017 1801   APPEARANCEUR HAZY (A) 03/31/2017 1801   LABSPEC 1.013 03/31/2017 1801   PHURINE 7.0 03/31/2017 1801   GLUCOSEU NEGATIVE 03/31/2017 1801   GLUCOSEU NEGATIVE 04/13/2007 1417   HGBUR NEGATIVE 03/31/2017 1801   BILIRUBINUR NEGATIVE 03/31/2017 1801   KETONESUR NEGATIVE 03/31/2017 1801   PROTEINUR NEGATIVE 03/31/2017 1801   UROBILINOGEN 0.2 02/04/2015 1800   NITRITE NEGATIVE 03/31/2017 1801   LEUKOCYTESUR NEGATIVE 03/31/2017 1801   Radiology Studies: Ct Head Wo Contrast  Result Date: 03/31/2017 CLINICAL DATA:  Fall. Headache. Neck pain. Imbalance. Initial encounter. EXAM: CT HEAD WITHOUT CONTRAST CT CERVICAL SPINE WITHOUT CONTRAST TECHNIQUE: Multidetector CT imaging of the head and cervical spine was performed following the standard protocol without intravenous contrast. Multiplanar CT image reconstructions of the cervical spine were also generated. COMPARISON:  Brain MRI 02/17/2017. Head and cervical spine CT 02/17/2017. FINDINGS: CT HEAD FINDINGS Brain: There is no evidence of acute infarct, intracranial hemorrhage, mass, midline shift, or extra-axial fluid collection. Mild generalized cerebral atrophy is within normal limits for age. Vascular: Calcified atherosclerosis at the skullbase. No hyperdense vessel. Skull: No fracture or focal osseous lesion. Sinuses/Orbits: Paranasal sinuses and mastoid air cells are clear. Unremarkable orbits. Other: Small left parietal scalp hematoma. CT CERVICAL SPINE FINDINGS Alignment: Mild cervical spine straightening. Unchanged trace retrolisthesis of C6 on C7, likely degenerative. Skull base and vertebrae: No fracture destructive osseous process. Soft tissues and spinal canal: No prevertebral swelling. Disc levels: Mild lower cervical disc degeneration, greatest at C6-7 and unchanged from the prior examination. Moderate left  facet arthrosis at C7-T1. Upper chest: Unremarkable. Other: None. IMPRESSION: 1. No evidence of acute intracranial abnormality. 2. Left parietal scalp hematoma. 3. No evidence of fracture or traumatic subluxation in the cervical spine. Electronically Signed   By: Logan Bores M.D.   On: 03/31/2017 16:08   Ct Cervical Spine Wo Contrast  Result Date: 03/31/2017 CLINICAL DATA:  Fall. Headache. Neck pain. Imbalance. Initial encounter. EXAM: CT HEAD WITHOUT CONTRAST CT CERVICAL SPINE WITHOUT CONTRAST TECHNIQUE: Multidetector CT imaging of the head and cervical spine was performed following the standard protocol without intravenous contrast. Multiplanar CT image reconstructions of the cervical spine were also generated. COMPARISON:  Brain MRI 02/17/2017. Head and cervical spine CT 02/17/2017. FINDINGS: CT HEAD FINDINGS Brain: There is no evidence of acute infarct, intracranial hemorrhage, mass, midline shift, or extra-axial fluid collection. Mild generalized cerebral atrophy is within normal limits for age. Vascular: Calcified atherosclerosis at the skullbase. No hyperdense vessel. Skull: No fracture or focal osseous lesion. Sinuses/Orbits: Paranasal sinuses and mastoid air cells are clear. Unremarkable orbits. Other: Small left parietal scalp hematoma. CT CERVICAL SPINE FINDINGS Alignment: Mild cervical spine straightening. Unchanged trace retrolisthesis of C6 on C7, likely degenerative. Skull base and vertebrae: No fracture destructive osseous process. Soft tissues and spinal canal: No prevertebral swelling. Disc levels: Mild lower cervical disc degeneration, greatest at C6-7 and unchanged from the prior examination. Moderate left facet arthrosis at C7-T1. Upper chest:  Unremarkable. Other: None. IMPRESSION: 1. No evidence of acute intracranial abnormality. 2. Left parietal scalp hematoma. 3. No evidence of fracture or traumatic subluxation in the cervical spine. Electronically Signed   By: Logan Bores M.D.   On:  03/31/2017 16:08   Chest Portable 1 View  Result Date: 03/31/2017 CLINICAL DATA:  Pain following fall EXAM: PORTABLE CHEST 1 VIEW COMPARISON:  February 17, 2017 FINDINGS: There is no edema or consolidation. There is scarring in the medial left base. Heart size and pulmonary vascularity are normal. No adenopathy. No pneumothorax. No acute fracture evident. There are breast implants bilaterally. IMPRESSION: Scarring in medial left base. No edema or consolidation. Stable cardiac silhouette. No pneumothorax evident. Electronically Signed   By: Lowella Grip III M.D.   On: 03/31/2017 18:12   Dg Hip Unilat With Pelvis 2-3 Views Left  Result Date: 03/31/2017 CLINICAL DATA:  Left hip pain after fall. EXAM: DG HIP (WITH OR WITHOUT PELVIS) 2-3V LEFT COMPARISON:  None. FINDINGS: There is an impacted subcapital fracture of the left femoral neck with mild foreshortening. The bones are osteopenic. Bowel sutures are noted in the pelvis. IMPRESSION: Impacted, subcapital fracture of the left femoral neck. Electronically Signed   By: Titus Dubin M.D.   On: 03/31/2017 15:36     LOS: 1 day   Time spent: 25 minutes.  Vance Gather, MD [facility] Pager [phone]  If 7PM-7AM, please contact night-coverage [email or web address] Password [email or web address] 04/01/2017, 1:55 PM

## 2017-04-02 ENCOUNTER — Encounter (HOSPITAL_COMMUNITY): Payer: Self-pay | Admitting: Orthopaedic Surgery

## 2017-04-02 ENCOUNTER — Encounter: Payer: PPO | Admitting: Physical Therapy

## 2017-04-02 DIAGNOSIS — M810 Age-related osteoporosis without current pathological fracture: Secondary | ICD-10-CM | POA: Diagnosis present

## 2017-04-02 LAB — BASIC METABOLIC PANEL
ANION GAP: 6 (ref 5–15)
BUN: 9 mg/dL (ref 6–20)
CHLORIDE: 112 mmol/L — AB (ref 101–111)
CO2: 22 mmol/L (ref 22–32)
Calcium: 8.2 mg/dL — ABNORMAL LOW (ref 8.9–10.3)
Creatinine, Ser: 0.65 mg/dL (ref 0.44–1.00)
GFR calc Af Amer: >60 mL/min (ref >60)
GLUCOSE: 115 mg/dL — AB (ref 65–99)
POTASSIUM: 3.6 mmol/L (ref 3.5–5.1)
SODIUM: 140 mmol/L (ref 135–145)

## 2017-04-02 LAB — CBC
HCT: 28.3 % — ABNORMAL LOW (ref 36.0–46.0)
HEMOGLOBIN: 9.2 g/dL — AB (ref 12.0–15.0)
MCH: 28.1 pg (ref 26.0–34.0)
MCHC: 32.5 g/dL (ref 30.0–36.0)
MCV: 86.5 fL (ref 78.0–100.0)
PLATELETS: 192 10*3/uL (ref 150–400)
RBC: 3.27 MIL/uL — AB (ref 3.87–5.11)
RDW: 13.3 % (ref 11.5–15.5)
WBC: 9.8 10*3/uL (ref 4.0–10.5)

## 2017-04-02 MED ORDER — HALOPERIDOL LACTATE 5 MG/ML IJ SOLN
2.0000 mg | Freq: Four times a day (QID) | INTRAMUSCULAR | Status: DC | PRN
  Administered 2017-04-02 – 2017-04-03 (×2): 2 mg via INTRAVENOUS
  Filled 2017-04-02 (×2): qty 1

## 2017-04-02 MED ORDER — VITAMIN B-12 1000 MCG PO TABS
1000.0000 ug | ORAL_TABLET | Freq: Every day | ORAL | Status: DC
  Administered 2017-04-02 – 2017-04-06 (×5): 1000 ug via ORAL
  Filled 2017-04-02 (×5): qty 1

## 2017-04-02 NOTE — Progress Notes (Signed)
   Subjective:  Patient reports pain as moderate.    Objective:   VITALS:   Vitals:   04/01/17 1401 04/01/17 1432 04/01/17 2027 04/02/17 0438  BP: (!) 102/50 (!) 99/51 (!) 103/35 (!) 109/47  Pulse: 67 67 99 81  Resp: 14 18 18 18   Temp:  97.9 F (36.6 C) 98.4 F (36.9 C) 99.9 F (37.7 C)  TempSrc:  Oral Oral Oral  SpO2: 99% 97% 99% 97%  Weight:      Height:        Neurologically intact Neurovascular intact Sensation intact distally Intact pulses distally Dorsiflexion/Plantar flexion intact Incision: dressing C/D/I and no drainage No cellulitis present Compartment soft   Lab Results  Component Value Date   WBC 9.8 04/02/2017   HGB 9.2 (L) 04/02/2017   HCT 28.3 (L) 04/02/2017   MCV 86.5 04/02/2017   PLT 192 04/02/2017     Assessment/Plan:  1 Day Post-Op   - Expected postop acute blood loss anemia - will monitor for symptoms - Up with PT/OT - DVT ppx - SCDs, ambulation, lovenox - WBAT operative extremity, direct anterior, no hip precautions - Pain control - Discharge planning  Eduard Roux 04/02/2017, 7:07 AM [phone]

## 2017-04-02 NOTE — Progress Notes (Addendum)
PROGRESS NOTE  Dorothy White  ZOX:[id number] DOB: 07/28/1951 DOA: 03/31/2017 PCP: Maurice Small, MD   Brief Narrative: Dorothy White is a 66 y.o. female with a history of depression, anxiety, migraine, moderate malnutrition, TIA, carotid artery stenosis and frequent falls who presented with another fall, having slipped in the kitchen, striking head and landing on left side with immediate left hip pain. Fall work up during recent admission in July 2018 with echo, MRI, and carotid imaging was unremarkable. On arrival she had a scalp laceration not requiring repair and XR revealed left hip fracture. She was admitted and underwent left THA with Dr. Erlinda Hong 9/5.   Assessment & Plan: Principal Problem:   Closed fracture of neck of left femur [facility]) Active Problems:   Scalp contusion   Fall at home, initial encounter   Hip fracture [facility])   Osteoporosis  Closed left hip fracture: s/p left THA 9/5 with Dr. Erlinda Hong. Fragility fracture in patient with osteoporosis.  - WBAT, will have dressing removed on POD #7, then staple removal on POD #10-14. Apply betadine to incision twice daily after dressing removal.  - Vit D normal at 35.5.  Acute blood loss anemia on pernicious anemia: Post-op, as expected. Baseline hemoglobin is normal >12mg /dl.  - Monitor CBC and symptoms. Currently level of 9.2 does not require transfusion.   Frequent falls: Patient with history of recurrent fall and admitted in July this year. Patient had MRI/MRA of brain which was unremarkable. Patient is known to have left internal carotid artery stenosis. Echocardiogram with normal systolic function, cardiac cath 2017 showed mild LAD lesion. No prodromal symptoms/LOC. - Had negative CT head at admission - PT/OT evaluation  Acute kidney injury: SCr elevated modestly at admission to 1.03, though this represents a significant elevation from baseline, as evidenced by improvement thus far to 0.65.  - Monitor  QT prolongation: Resolved, QTc  418msec 9/5.  - Monitor, not on suboxone.   History of substance abuse, opioid use disorder:  - Holding suboxone in light of need for full opioid agonist perioperatively. May need to  - UDS +amphetamines (no known prescriptions would cause this) and benzodiazepines (Rx klonopin)  Likely peripheral neuropathy causing lower extremity weakness:  - Outpatient EMG was recommended in prior visit. Recommended to continue to follow up. Continue vitamin B12  History of migraine: Seen by neurologist in the past, has hemiplegic symptoms with these.  - Continue topamax.  Moderate protein calorie malnutrition:  - Nutrition consulted - Continue feeding supplement.  DVT prophylaxis: Lovenox per orthopedics Code Status: Full Family Communication: None at bedside Disposition Plan: Therapy evaluations pending, anticipate SNF.  Consultants:   Orthopedics, Dr. Erlinda Hong  Procedures:   04/01/2017, Dr. Erlinda Hong: LEFT DIRECT ANTERIOR TOTAL HIP REPLACEMENT   Antimicrobials:  None   Subjective: Pain controlled, feels embarrassed for falling. Slept ok.   Objective: BP (!) 101/30 (BP Location: Right Arm)   Pulse 78   Temp 99.1 F (37.3 C) (Oral)   Resp 17   Ht 5\' 5"  (1.651 m)   Wt 56.5 kg (124 lb 9.6 oz)   SpO2 98%   BMI 20.73 kg/m   Gen: 66 y.o. female in no distress  Pulm: Non-labored breathing room air. Clear to auscultation bilaterally.  CV: Regular rate and rhythm. No murmur, rub, or gallop. No JVD, no pedal edema. GI: Abdomen soft, non-tender, non-distended, with normoactive bowel sounds. No organomegaly or masses felt. Ext: Warm, no deformities. Left thigh compartment soft, sensorimotor exam normal, cap refill brisk.  Skin: Left hip dressing c/d/i Neuro: Alert and oriented. No focal neurological deficits. Psych: Judgement and insight appear normal. Mood & affect appropriate.   Data Reviewed: I have personally reviewed following labs and imaging studies  CBC:  Recent Labs Lab  03/31/17 1511 04/01/17 1640 04/02/17 0402  WBC 9.7 8.0 9.8  NEUTROABS 8.6*  --   --   HGB 14.0 11.4* 9.2*  HCT 43.7 35.6* 28.3*  MCV 87.1 88.1 86.5  PLT 205 180 505   Basic Metabolic Panel:  Recent Labs Lab 03/31/17 1511 04/01/17 1640 04/02/17 0402  NA 140  --  140  K 4.0  --  3.6  CL 107  --  112*  CO2 24  --  22  GLUCOSE 97  --  115*  BUN 14  --  9  CREATININE 1.03* 0.79 0.65  CALCIUM 9.4  --  8.2*   GFR: Estimated Creatinine Clearance: 62.5 mL/min (by C-G formula based on SCr of 0.65 mg/dL). Liver Function Tests:  Recent Labs Lab 03/31/17 1835  AST 15  ALT 15  ALKPHOS 94  BILITOT 0.8  PROT 6.8  ALBUMIN 3.9   Coagulation Profile:  Recent Labs Lab 03/31/17 1835  INR 0.97   Urine analysis:    Component Value Date/Time   COLORURINE YELLOW 03/31/2017 1801   APPEARANCEUR HAZY (A) 03/31/2017 1801   LABSPEC 1.013 03/31/2017 1801   PHURINE 7.0 03/31/2017 1801   GLUCOSEU NEGATIVE 03/31/2017 1801   GLUCOSEU NEGATIVE 04/13/2007 1417   HGBUR NEGATIVE 03/31/2017 1801   BILIRUBINUR NEGATIVE 03/31/2017 1801   KETONESUR NEGATIVE 03/31/2017 1801   PROTEINUR NEGATIVE 03/31/2017 1801   UROBILINOGEN 0.2 02/04/2015 1800   NITRITE NEGATIVE 03/31/2017 1801   LEUKOCYTESUR NEGATIVE 03/31/2017 1801   Radiology Studies: Dg Pelvis Portable  Result Date: 04/01/2017 CLINICAL DATA:  Status post left total hip joint prosthesis placement. EXAM: PORTABLE PELVIS 1-2 VIEWS COMPARISON:  Fluoro spot radiographs from the intraoperative procedure earlier today. FINDINGS: The patient has undergone left total hip joint prosthesis placement. Radiographic positioning of the prosthetic components is good. The interface with the native bone appears normal. No acute native bone abnormality is observed. IMPRESSION: There is no postprocedure complication following left total hip joint prosthesis placement. Electronically Signed   By: David  Martinique M.D.   On: 04/01/2017 16:29   Chest Portable 1  View  Result Date: 03/31/2017 CLINICAL DATA:  Pain following fall EXAM: PORTABLE CHEST 1 VIEW COMPARISON:  February 17, 2017 FINDINGS: There is no edema or consolidation. There is scarring in the medial left base. Heart size and pulmonary vascularity are normal. No adenopathy. No pneumothorax. No acute fracture evident. There are breast implants bilaterally. IMPRESSION: Scarring in medial left base. No edema or consolidation. Stable cardiac silhouette. No pneumothorax evident. Electronically Signed   By: Lowella Grip III M.D.   On: 03/31/2017 18:12   Dg C-arm 1-60 Min  Result Date: 04/01/2017 CLINICAL DATA:  66 year old female undergoing left direct anterior total hip replacement. Left femoral neck fracture yesterday after a fall. EXAM: OPERATIVE LEFT HIP (WITH PELVIS IF PERFORMED) 2 VIEWS TECHNIQUE: Fluoroscopic spot image(s) were submitted for interpretation post-operatively. COMPARISON:  Preoperative left hip series 9418. FINDINGS: 2 intraoperative fluoroscopic spot views of the left hip and lower pelvis. Left total hip arthroplasty hardware in place and appears normally aligned in the AP view. FLUOROSCOPY TIME:  0 minutes 48 seconds IMPRESSION: Intraoperative images of Left total hip arthroplasty, no adverse features. Electronically Signed   By: Genevie Ann  M.D.   On: 04/01/2017 16:02   Dg Hip Operative Unilat W Or W/o Pelvis Left  Result Date: 04/01/2017 CLINICAL DATA:  66 year old female undergoing left direct anterior total hip replacement. Left femoral neck fracture yesterday after a fall. EXAM: OPERATIVE LEFT HIP (WITH PELVIS IF PERFORMED) 2 VIEWS TECHNIQUE: Fluoroscopic spot image(s) were submitted for interpretation post-operatively. COMPARISON:  Preoperative left hip series 9418. FINDINGS: 2 intraoperative fluoroscopic spot views of the left hip and lower pelvis. Left total hip arthroplasty hardware in place and appears normally aligned in the AP view. FLUOROSCOPY TIME:  0 minutes 48 seconds  IMPRESSION: Intraoperative images of Left total hip arthroplasty, no adverse features. Electronically Signed   By: Genevie Ann M.D.   On: 04/01/2017 16:02     LOS: 2 days   Time spent: 25 minutes.  Vance Gather, MD [facility] Pager [phone]  If 7PM-7AM, please contact night-coverage [email or web address] Password [email or web address] 04/02/2017, 4:26 PM

## 2017-04-02 NOTE — Evaluation (Signed)
Occupational Therapy Evaluation Patient Details Name: Dorothy White MRN: [id number] DOB: [birth year] Today's Date: 04/02/2017    History of Present Illness Dorothy White a 66 y.o.femalewith medical history significant of anxiety depression, migraine, moderate degree malnutrition, TIA, carotid artery stenosis was admitted on July this year for frequent falls when workup was unremarkable presented with fall sustaining scalp laceration and hip injury. Patient reported that she was doing well until when she slipped in the kitchen and hit her head and left hip. Pt s/p direct anterior L THA.   Clinical Impression   This 66 y/o F presents with the above. Pt with increased confusion/disorientation during session so unclear of PLOF, though Pt reports she was living alone and was completing ADLs without assist. Pt currently requires MinA for functional mobility at RW level, MaxA for LB ADLs. Due to Pt confusion and Pt's current level of assist, recommend additional OT services in SNF setting prior to return home as well as continued acute OT services to maximize Pt's safety and independence with ADLs and functional mobility.     Follow Up Recommendations  SNF;Supervision/Assistance - 24 hour    Equipment Recommendations  Other (comment) (to be determined in next venue of care )           Precautions / Restrictions Precautions Precautions: Fall Restrictions Weight Bearing Restrictions: Yes LLE Weight Bearing: Weight bearing as tolerated      Mobility Bed Mobility Overal bed mobility: Needs Assistance Bed Mobility: Supine to Sit;Sit to Supine     Supine to sit: Min assist Sit to supine: Min assist   General bed mobility comments: multimodal cues to initiate/sequence task of sitting EOB, required assist for LEs and initial trunk support; assist for LE management when returning to supine   Transfers Overall transfer level: Needs assistance Equipment used: Rolling walker (2  wheeled) Transfers: Sit to/from Stand Sit to Stand: Min assist         General transfer comment: v/c's for safe hand placement,increased time, minA for intial power up    Balance Overall balance assessment: History of Falls                                         ADL either performed or assessed with clinical judgement   ADL Overall ADL's : Needs assistance/impaired Eating/Feeding: Set up;Sitting   Grooming: Wash/dry hands;Set up;Sitting   Upper Body Bathing: Min guard;Sitting   Lower Body Bathing: Moderate assistance;Sit to/from stand   Upper Body Dressing : Minimal assistance;Sitting   Lower Body Dressing: Maximal assistance;Sit to/from stand   Toilet Transfer: Minimal assistance;Ambulation;BSC;RW Toilet Transfer Details (indicate cue type and reason): BSC over toilet  Toileting- Clothing Manipulation and Hygiene: Minimal assistance;Sit to/from stand Toileting - Clothing Manipulation Details (indicate cue type and reason): assist to steady in standing      Functional mobility during ADLs: Minimal assistance;Rolling walker                           Pertinent Vitals/Pain Pain Assessment: Faces Faces Pain Scale: Hurts even more Pain Location: L hip Pain Descriptors / Indicators: Aching;Sore Pain Intervention(s): Limited activity within patient's tolerance;Monitored during session;Repositioned     Hand Dominance Right   Extremity/Trunk Assessment Upper Extremity Assessment Upper Extremity Assessment: Generalized weakness   Lower Extremity Assessment Lower Extremity Assessment: Defer to PT evaluation   Cervical /  Trunk Assessment Cervical / Trunk Assessment: Normal   Communication Communication Communication: No difficulties   Cognition Arousal/Alertness: Awake/alert Behavior During Therapy: Flat affect;Anxious Overall Cognitive Status: Impaired/Different from baseline Area of Impairment: Orientation;Attention;Memory;Following  commands;Safety/judgement;Awareness;Problem solving                   Current Attention Level: Sustained Memory: Decreased short-term memory Following Commands: Follows one step commands with increased time Safety/Judgement: Decreased awareness of safety;Decreased awareness of deficits Awareness: Emergent Problem Solving: Slow processing;Decreased initiation;Difficulty sequencing;Requires verbal cues;Requires tactile cues General Comments: unsure baseline cognition; Pt unable to recall why she is in the hospital (asking if she had a stroke); asking questions that do no correlate with location/situation; Pt becoming tearful/emotional end of session, reporting being fearful (suspect of situation), RN aware and therapist providing comfort as needed                     Home Living Family/patient expects to be discharged to:: Skilled nursing facility Living Arrangements: Non-relatives/Friends                               Additional Comments: pt lives with a friend who works during the day. Pt reports 1 story home with 3 steps in the back with handrail and tub shower. unsure of accuracy due to pt confusion      Prior Functioning/Environment Level of Independence: Needs assistance  Gait / Transfers Assistance Needed: reports using cane and RW but couldn't consistently tell me if she used it currently ADL's / Homemaking Assistance Needed: reports completing ADLs without assist   Comments: pt poor historian        OT Problem List: Decreased strength;Impaired balance (sitting and/or standing);Decreased cognition;Decreased safety awareness;Decreased knowledge of use of DME or AE;Decreased activity tolerance;Pain      OT Treatment/Interventions: Self-care/ADL training;DME and/or AE instruction;Therapeutic activities;Balance training;Therapeutic exercise;Patient/family education;Cognitive remediation/compensation    OT Goals(Current goals can be found in the care plan  section) Acute Rehab OT Goals Patient Stated Goal: regain independence  OT Goal Formulation: With patient Time For Goal Achievement: 04/16/17 Potential to Achieve Goals: Good  OT Frequency: Min 2X/week                             AM-PAC PT "6 Clicks" Daily Activity     Outcome Measure Help from another person eating meals?: A Little Help from another person taking care of personal grooming?: A Little Help from another person toileting, which includes using toliet, bedpan, or urinal?: A Little Help from another person bathing (including washing, rinsing, drying)?: A Lot Help from another person to put on and taking off regular upper body clothing?: A Little Help from another person to put on and taking off regular lower body clothing?: A Lot 6 Click Score: 16   End of Session Equipment Utilized During Treatment: Gait belt;Rolling walker Nurse Communication: Mobility status  Activity Tolerance: Patient tolerated treatment well Patient left: in bed;with call bell/phone within reach;with bed alarm set  OT Visit Diagnosis: Unsteadiness on feet (R26.81);Muscle weakness (generalized) (M62.81);Pain Pain - Right/Left: Left Pain - part of body: Hip                Time: 2197-5883 OT Time Calculation (min): 24 min Charges:  OT General Charges $OT Visit: 1 Visit OT Evaluation $OT Eval Low Complexity: 1 Low G-Codes:     [place]  [place], [place] Pager [phone] 04/02/2017   Raymondo Band 04/02/2017, 4:05 PM

## 2017-04-02 NOTE — Evaluation (Signed)
Physical Therapy Evaluation Patient Details Name: Dorothy White MRN: [id number] DOB: [birth year] Today's Date: 04/02/2017   History of Present Illness  Haizel Gatchell Hunteris a 66 y.o.femalewith medical history significant of anxiety depression, migraine, moderate degree malnutrition, TIA, carotid artery stenosis was admitted on July this year for frequent falls when workup was unremarkable presented with fall sustaining scalp laceration and hip injury. Patient reported that she was doing well until when she slipped in the kitchen and hit her head and left hip. Pt s/p direct anterior L THA.  Clinical Impression  Pt is s/p THA resulting in the deficits listed below (see PT Problem List). Pt tolerated OOB well for first time. Pt confused with decreased insight to deficits and safety. Pt no oriented. Pt unsafe to return home at this time. Pt will benefit from skilled PT to increase their independence and safety with mobility to allow discharge to the venue listed below.      Follow Up Recommendations SNF;Supervision/Assistance - 24 hour    Equipment Recommendations  Rolling walker with 5" wheels    Recommendations for Other Services       Precautions / Restrictions Precautions Precautions: Fall Restrictions Weight Bearing Restrictions: Yes LLE Weight Bearing: Weight bearing as tolerated      Mobility  Bed Mobility Overal bed mobility: Needs Assistance Bed Mobility: Supine to Sit     Supine to sit: Min assist     General bed mobility comments: tactile cues to initiate transfer, pt able to bring LEs off EOB  Transfers Overall transfer level: Needs assistance Equipment used: Rolling walker (2 wheeled) Transfers: Sit to/from Stand Sit to Stand: Min assist         General transfer comment: v/c's for safe hand placement,increased time, minA for intial power up  Ambulation/Gait Ambulation/Gait assistance: Min assist Ambulation Distance (Feet): 50 Feet Assistive device:  Rolling walker (2 wheeled) Gait Pattern/deviations: Step-through pattern;Decreased stride length;Decreased stance time - left;Antalgic;Trunk flexed;Narrow base of support Gait velocity: slow Gait velocity interpretation: Below normal speed for age/gender General Gait Details: v/c's for sequencing, pt with increased step length with amb but then regressed to shuffling gait pattern.  minA for walker management  Stairs            Wheelchair Mobility    Modified Rankin (Stroke Patients Only)       Balance Overall balance assessment: History of Falls                                           Pertinent Vitals/Pain Pain Assessment: 0-10 Pain Score: 9  Pain Location: L hip Pain Descriptors / Indicators:  (couldn't describe)    Home Living Family/patient expects to be discharged to:: Skilled nursing facility Living Arrangements: Non-relatives/Friends (friend)               Additional Comments: pt lives with a friend who works during the day. Pt reports 1 story home with 3 steps in the back with handrail and tub shower. unsure of accuracy due to pt confusion    Prior Function Level of Independence: Needs assistance   Gait / Transfers Assistance Needed: reports using cane and RW but couldn't consistently tell me if she used it currently     Comments: pt poor historian     Hand Dominance   Dominant Hand: Right    Extremity/Trunk Assessment   Upper Extremity Assessment Upper  Extremity Assessment: Generalized weakness    Lower Extremity Assessment Lower Extremity Assessment: Generalized weakness (L LE, pt initiated mvmt and can complete quad set)    Cervical / Trunk Assessment Cervical / Trunk Assessment: Normal  Communication   Communication: No difficulties  Cognition Arousal/Alertness: Awake/alert Behavior During Therapy: Flat affect Overall Cognitive Status: Impaired/Different from baseline (no family/caregiver to determine  baseline) Area of Impairment: Orientation;Attention;Memory;Following commands;Safety/judgement;Awareness;Problem solving                 Orientation Level: Disoriented to;Place;Time;Situation (states [facility]) Current Attention Level: Sustained Memory: Decreased short-term memory (doesn't recall setting or situation ) Following Commands: Follows one step commands with increased time Safety/Judgement: Decreased awareness of safety;Decreased awareness of deficits Awareness: Emergent Problem Solving: Slow processing;Decreased initiation;Difficulty sequencing;Requires verbal cues;Requires tactile cues General Comments: unsure baseline cognition      General Comments General comments (skin integrity, edema, etc.): pt assisted to bathroom, mina with transfer on/off commode, minA to maintain balance for hygiene. pt stood at sink to wash hands with min guard and no UE support    Exercises Total Joint Exercises Quad Sets: AROM;Left;10 reps;Supine Long Arc Quad: AROM;Left;10 reps;Seated   Assessment/Plan    PT Assessment Patient needs continued PT services  PT Problem List Decreased strength;Decreased range of motion;Decreased activity tolerance;Decreased balance;Decreased mobility;Decreased cognition;Decreased knowledge of use of DME;Decreased safety awareness       PT Treatment Interventions DME instruction;Stair training;Gait training;Functional mobility training;Therapeutic activities;Therapeutic exercise;Balance training;Neuromuscular re-education    PT Goals (Current goals can be found in the Care Plan section)  Acute Rehab PT Goals Patient Stated Goal: didn't state PT Goal Formulation: With patient Time For Goal Achievement: 04/16/17 Potential to Achieve Goals: Good    Frequency 7X/week   Barriers to discharge Decreased caregiver support alone during the day, h/o falls    Co-evaluation               AM-PAC PT "6 Clicks" Daily Activity  Outcome Measure  Difficulty turning over in bed (including adjusting bedclothes, sheets and blankets)?: A Lot Difficulty moving from lying on back to sitting on the side of the bed? : A Lot Difficulty sitting down on and standing up from a chair with arms (e.g., wheelchair, bedside commode, etc,.)?: A Lot Help needed moving to and from a bed to chair (including a wheelchair)?: A Little Help needed walking in hospital room?: A Little Help needed climbing 3-5 steps with a railing? : A Lot 6 Click Score: 14    End of Session Equipment Utilized During Treatment: Gait belt Activity Tolerance: Patient tolerated treatment well Patient left: in chair;with call bell/phone within reach;with chair alarm set Nurse Communication: Mobility status PT Visit Diagnosis: Difficulty in walking, not elsewhere classified (R26.2)    Time: 4742-5956 PT Time Calculation (min) (ACUTE ONLY): 22 min   Charges:   PT Evaluation $PT Eval Moderate Complexity: 1 Mod     PT G CodesKittie Plater, PT, DPT Pager #: [phone] Office #: [phone]   [employer] 04/02/2017, 1:51 PM

## 2017-04-03 DIAGNOSIS — M8000XA Age-related osteoporosis with current pathological fracture, unspecified site, initial encounter for fracture: Secondary | ICD-10-CM

## 2017-04-03 LAB — BASIC METABOLIC PANEL WITH GFR
Anion gap: 6 (ref 5–15)
BUN: 8 mg/dL (ref 6–20)
CO2: 21 mmol/L — ABNORMAL LOW (ref 22–32)
Calcium: 8.2 mg/dL — ABNORMAL LOW (ref 8.9–10.3)
Chloride: 111 mmol/L (ref 101–111)
Creatinine, Ser: 0.64 mg/dL (ref 0.44–1.00)
GFR calc Af Amer: >60 mL/min (ref >60)
GFR calc non Af Amer: >60 mL/min (ref >60)
Glucose, Bld: 102 mg/dL — ABNORMAL HIGH (ref 65–99)
Potassium: 3.6 mmol/L (ref 3.5–5.1)
Sodium: 138 mmol/L (ref 135–145)

## 2017-04-03 LAB — CBC
HCT: 26.2 % — ABNORMAL LOW (ref 36.0–46.0)
Hemoglobin: 8.4 g/dL — ABNORMAL LOW (ref 12.0–15.0)
MCH: 27.8 pg (ref 26.0–34.0)
MCHC: 32.1 g/dL (ref 30.0–36.0)
MCV: 86.8 fL (ref 78.0–100.0)
Platelets: 158 K/uL (ref 150–400)
RBC: 3.02 MIL/uL — ABNORMAL LOW (ref 3.87–5.11)
RDW: 13.3 % (ref 11.5–15.5)
WBC: 9.6 K/uL (ref 4.0–10.5)

## 2017-04-03 MED ORDER — SENNA 8.6 MG PO TABS
1.0000 | ORAL_TABLET | Freq: Every day | ORAL | Status: DC
  Administered 2017-04-03 – 2017-04-05 (×3): 8.6 mg via ORAL
  Filled 2017-04-03 (×4): qty 1

## 2017-04-03 NOTE — Progress Notes (Signed)
   Subjective:  Patient is confused this morning wanting to go home.  Objective:   VITALS:   Vitals:   04/02/17 0438 04/02/17 1452 04/02/17 1949 04/02/17 2245  BP: (!) 109/47 (!) 101/30 (!) 120/42 (!) 104/59  Pulse: 81 78 92 (!) 106  Resp: 18 17 16 18   Temp: 99.9 F (37.7 C) 99.1 F (37.3 C) 99.8 F (37.7 C) 99.2 F (37.3 C)  TempSrc: Oral Oral Axillary Oral  SpO2: 97% 98% 93% 95%  Weight:      Height:        Neurologically intact Neurovascular intact Sensation intact distally Intact pulses distally Dorsiflexion/Plantar flexion intact Incision: dressing C/D/I and no drainage No cellulitis present Compartment soft   Lab Results  Component Value Date   WBC 9.6 04/03/2017   HGB 8.4 (L) 04/03/2017   HCT 26.2 (L) 04/03/2017   MCV 86.8 04/03/2017   PLT 158 04/03/2017     Assessment/Plan:  2 Days Post-Op   - stable from ortho stand point for dc to SNF  Eduard Roux 04/03/2017, 7:24 AM [phone]

## 2017-04-03 NOTE — Progress Notes (Signed)
New orders received from Dr.Opyd for safety sitter and haldol .Orders carried out will continue to monitor patient.

## 2017-04-03 NOTE — Progress Notes (Addendum)
PROGRESS NOTE  Dorothy White  XFG:[id number] DOB: [birth year] DOA: 03/31/2017 PCP: Maurice Small, MD   Brief Narrative: Dorothy White is a 66 y.o. female with a history of depression, anxiety, migraine, moderate malnutrition, TIA, carotid artery stenosis and frequent falls who presented with another fall, having slipped in the kitchen, striking head and landing on left side with immediate left hip pain. Fall work up during recent admission in July 2018 with echo, MRI, and carotid imaging was unremarkable. On arrival she had a scalp laceration not requiring repair and XR revealed left hip fracture. She was admitted and underwent left THA with Dr. Erlinda Hong 9/5.   Assessment & Plan: Principal Problem:   Closed fracture of neck of left femur [facility]) Active Problems:   Scalp contusion   Fall at home, initial encounter   Hip fracture [facility])   Osteoporosis  Closed left hip fracture: s/p left THA 9/5 with Dr. Erlinda Hong. Fragility fracture in patient with osteoporosis.  - WBAT, will have dressing removed on POD #7, then staple removal on POD #10-14. Apply betadine to incision twice daily after dressing removal.  - Vit D normal at 35.5.  Acute blood loss anemia on pernicious anemia: Post-op, as expected. Baseline hemoglobin is normal >12mg /dl.  - Monitor CBC and symptoms. Currently level of 8.4 does not require transfusion, but will need continued monitoring.   Frequent falls: Patient with history of recurrent fall and admitted in July this year. Patient had MRI/MRA of brain which was unremarkable. Patient is known to have left internal carotid artery stenosis. Echocardiogram with normal systolic function, cardiac cath 2017 showed mild LAD lesion. No prodromal symptoms/LOC. - Had negative CT head at admission - Discharge to SNF per PT/OT recommendations.   Acute kidney injury: SCr elevated modestly at admission to 1.03, though this represents a significant elevation from baseline, as evidenced by improvement  thus far to 0.65.  - Monitor  QT prolongation: Resolved, QTc 415msec 9/5.  - Monitor, not on suboxone.   History of substance abuse:  - Pt no longer on suboxone. - UDS +amphetamines (no known prescriptions would cause this) and benzodiazepines (Rx klonopin).  Acute delirium: Related to pain, narcotics, hospitalization. Negative head CT at admission, nonfocal exam. Recent TSH wnl. ?delayed withdrawal from substances.  - Sitter for safety currently - Minimize sedating medications as able.  - Monitor for other stigmata of withdrawal, though not typical of any specific agent.  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 Recommended to continue to follow up. Continue vitamin B12  History of migraine: Seen by neurologist in the past, has hemiplegic symptoms with these.  - Continue topamax.  Moderate protein calorie malnutrition:  - Nutrition consulted - Continue feeding supplement.  DVT prophylaxis: Lovenox per orthopedics Code Status: Full Family Communication: None at bedside Disposition Plan: SNF once bed available, 30-day note and FL-2 signed.   Consultants:   Orthopedics, Dr. Erlinda Hong  Procedures:   04/01/2017, Dr. Erlinda Hong: LEFT DIRECT ANTERIOR TOTAL HIP REPLACEMENT   Antimicrobials:  None   Subjective: Became delirious last night, confused.   Objective: BP (!) 97/39 (BP Location: Left Arm)   Pulse 85   Temp 99.4 F (37.4 C) (Oral)   Resp 18   Ht 5\' 5"  (1.651 m)   Wt 56.5 kg (124 lb 9.6 oz)   SpO2 97%   BMI 20.73 kg/m   Gen: 66 y.o. female in no distress  Pulm: Non-labored breathing room air. Clear  to auscultation bilaterally.  CV: Regular rate and rhythm. No murmur, rub, or gallop. No JVD, no pedal edema. GI: Abdomen soft, non-tender, non-distended, with normoactive bowel sounds. No organomegaly or masses felt. Ext: Warm, no deformities. Left thigh compartment soft, sensorimotor exam normal, cap  refill brisk.  Skin: Left hip dressing c/d/i Neuro: Alert and oriented. No focal neurological deficits. Psych: Judgement and insight appear normal. Mood & affect appropriate.   Data Reviewed: I have personally reviewed following labs and imaging studies  CBC:  Recent Labs Lab 03/31/17 1511 04/01/17 1640 04/02/17 0402 04/03/17 0422  WBC 9.7 8.0 9.8 9.6  NEUTROABS 8.6*  --   --   --   HGB 14.0 11.4* 9.2* 8.4*  HCT 43.7 35.6* 28.3* 26.2*  MCV 87.1 88.1 86.5 86.8  PLT 205 180 192 229   Basic Metabolic Panel:  Recent Labs Lab 03/31/17 1511 04/01/17 1640 04/02/17 0402 04/03/17 0422  NA 140  --  140 138  K 4.0  --  3.6 3.6  CL 107  --  112* 111  CO2 24  --  22 21*  GLUCOSE 97  --  115* 102*  BUN 14  --  9 8  CREATININE 1.03* 0.79 0.65 0.64  CALCIUM 9.4  --  8.2* 8.2*   GFR: Estimated Creatinine Clearance: 62.5 mL/min (by C-G formula based on SCr of 0.64 mg/dL). Liver Function Tests:  Recent Labs Lab 03/31/17 1835  AST 15  ALT 15  ALKPHOS 94  BILITOT 0.8  PROT 6.8  ALBUMIN 3.9   Coagulation Profile:  Recent Labs Lab 03/31/17 1835  INR 0.97   Urine analysis:    Component Value Date/Time   COLORURINE YELLOW 03/31/2017 1801   APPEARANCEUR HAZY (A) 03/31/2017 1801   LABSPEC 1.013 03/31/2017 1801   PHURINE 7.0 03/31/2017 1801   GLUCOSEU NEGATIVE 03/31/2017 1801   GLUCOSEU NEGATIVE 04/13/2007 1417   HGBUR NEGATIVE 03/31/2017 1801   BILIRUBINUR NEGATIVE 03/31/2017 1801   KETONESUR NEGATIVE 03/31/2017 1801   PROTEINUR NEGATIVE 03/31/2017 1801   UROBILINOGEN 0.2 02/04/2015 1800   NITRITE NEGATIVE 03/31/2017 1801   LEUKOCYTESUR NEGATIVE 03/31/2017 1801   Radiology Studies: Dg Pelvis Portable  Result Date: 04/01/2017 CLINICAL DATA:  Status post left total hip joint prosthesis placement. EXAM: PORTABLE PELVIS 1-2 VIEWS COMPARISON:  Fluoro spot radiographs from the intraoperative procedure earlier today. FINDINGS: The patient has undergone left total hip  joint prosthesis placement. Radiographic positioning of the prosthetic components is good. The interface with the native bone appears normal. No acute native bone abnormality is observed. IMPRESSION: There is no postprocedure complication following left total hip joint prosthesis placement. Electronically Signed   By: David  Martinique M.D.   On: 04/01/2017 16:29     LOS: 3 days   Time spent: 35 minutes.  Vance Gather, MD [facility] Pager [phone]  If 7PM-7AM, please contact night-coverage [email or web address] Password [facility] 04/03/2017, 2:33 PM

## 2017-04-03 NOTE — Care Management Important Message (Signed)
Important Message  Patient Details  Name: Dorothy White MRN: [id number] Date of Birth: October 22, 1950   Medicare Important Message Given:  Yes    Sahira Cataldi [address] 04/03/2017, 11:36 AM

## 2017-04-03 NOTE — Evaluation (Signed)
Physical Therapy Evaluation Patient Details Name: Dorothy White MRN: [id number] DOB: [birth year] Today's Date: 04/03/2017   History of Present Illness  Arlyn Bumpus Hunteris a 66 y.o.femalewith medical history significant of anxiety depression, migraine, moderate degree malnutrition, TIA, carotid artery stenosis was admitted on July this year for frequent falls when workup was unremarkable presented with fall sustaining scalp laceration and hip injury. Patient reported that she was doing well until when she slipped in the kitchen and hit her head and left hip. Pt s/p direct anterior L THA.  Clinical Impression  Continued therapy today with walk and effort to do there exercises to BLE's to increase comfort with mobility and progress her strength and flexibility.  Pt agreed she did feel beter after doing the therapy and was able to sit up in her chair for dinner.  Will continue on as tolerated acutely and work on safety with recall of hand placement and use of RW.    Follow Up Recommendations SNF;Supervision/Assistance - 24 hour    Equipment Recommendations  Rolling walker with 5" wheels    Recommendations for Other Services       Precautions / Restrictions Precautions Precautions: Fall Restrictions Weight Bearing Restrictions: Yes LLE Weight Bearing: Weight bearing as tolerated      Mobility  Bed Mobility Overal bed mobility: Needs Assistance Bed Mobility: Supine to Sit     Supine to sit: Min assist     General bed mobility comments: reminders for hand placement and safety bedside as pt is trying to climb over bedrail when PT arrives  Transfers Overall transfer level: Needs assistance Equipment used: Rolling walker (2 wheeled) Transfers: Sit to/from Stand Sit to Stand: Min guard;Min assist         General transfer comment: cued hands and transition to walker  Ambulation/Gait Ambulation/Gait assistance: Min guard;Min assist Ambulation Distance (Feet): 50  Feet Assistive device: Rolling walker (2 wheeled) Gait Pattern/deviations: Step-through pattern;Decreased stride length;Trunk flexed;Wide base of support;Antalgic Gait velocity: reduced Gait velocity interpretation: Below normal speed for age/gender General Gait Details: pt is able to walk one direction but had to cue transitions toturn walker  Stairs            Wheelchair Mobility    Modified Rankin (Stroke Patients Only)       Balance Overall balance assessment: History of Falls;Needs assistance Sitting-balance support: Feet supported Sitting balance-Leahy Scale: Good     Standing balance support: Bilateral upper extremity supported;During functional activity Standing balance-Leahy Scale: Fair Standing balance comment: less than fair dynamically                             Pertinent Vitals/Pain Pain Assessment: 0-10 Pain Score: 6  Pain Location: L hip Pain Descriptors / Indicators: Aching;Sore Pain Intervention(s): Limited activity within patient's tolerance;Monitored during session;Premedicated before session;Repositioned    Home Living                        Prior Function                 Hand Dominance        Extremity/Trunk Assessment                Communication      Cognition Arousal/Alertness: Awake/alert Behavior During Therapy: Flat affect;Anxious Overall Cognitive Status: Impaired/Different from baseline Area of Impairment: Orientation;Attention;Memory;Following commands;Safety/judgement;Awareness;Problem solving  Orientation Level: Place;Time;Situation;Disoriented to Current Attention Level: Selective Memory: Decreased recall of precautions;Decreased short-term memory Following Commands: Follows one step commands with increased time;Follows one step commands inconsistently Safety/Judgement: Decreased awareness of safety;Decreased awareness of deficits Awareness: Intellectual Problem  Solving: Slow processing;Decreased initiation;Difficulty sequencing;Requires verbal cues General Comments: Pt was more calm and able to follow instructions per sitter prior to PT arrival      General Comments General comments (skin integrity, edema, etc.): Pt is up to maneuver on walker with encouragement and then did ther ex with assist    Exercises Total Joint Exercises Ankle Circles/Pumps: AROM;Both;5 reps Quad Sets: AROM;Both;10 reps Gluteal Sets: AROM;Both;10 reps Hip ABduction/ADduction: AAROM;AROM;Both;10 reps Long Arc Quad: AROM;AAROM;Both;10 reps Knee Flexion: AROM;Both;10 reps   Assessment/Plan    PT Assessment    PT Problem List         PT Treatment Interventions      PT Goals (Current goals can be found in the Care Plan section)  Acute Rehab PT Goals Patient Stated Goal: to walk alone again    Frequency 7X/week   Barriers to discharge        Co-evaluation               AM-PAC PT "6 Clicks" Daily Activity  Outcome Measure Difficulty turning over in bed (including adjusting bedclothes, sheets and blankets)?: A Little Difficulty moving from lying on back to sitting on the side of the bed? : Unable Difficulty sitting down on and standing up from a chair with arms (e.g., wheelchair, bedside commode, etc,.)?: Unable Help needed moving to and from a bed to chair (including a wheelchair)?: A Little Help needed walking in hospital room?: A Little Help needed climbing 3-5 steps with a railing? : A Little 6 Click Score: 14    End of Session Equipment Utilized During Treatment: Gait belt Activity Tolerance: Patient tolerated treatment well Patient left: in chair;with call bell/phone within reach;with nursing/sitter in room Nurse Communication: Mobility status PT Visit Diagnosis: Difficulty in walking, not elsewhere classified (R26.2)    Time: 7939-0300 PT Time Calculation (min) (ACUTE ONLY): 30 min   Charges:     PT Treatments $Gait Training: 8-22  mins $Therapeutic Exercise: 8-22 mins   PT G Codes:   PT G-Codes **NOT FOR INPATIENT CLASS** Functional Assessment Tool Used: AM-PAC 6 Clicks Basic Mobility    Ramond Dial 04/03/2017, 6:00 PM   Mee Hives, PT MS Acute Rehab Dept. Number: [place] and [place]

## 2017-04-03 NOTE — NC FL2 (Signed)
[place] LEVEL OF CARE SCREENING TOOL     IDENTIFICATION  Patient Name: Dorothy White Birthdate: [birth year] Sex: female Admission Date (Current Location): 03/31/2017  [facility] and [place] Number:  Herbalist and Address:  [facility], [place] [address] [place], [place] [address]      Provider Number: [id number]  Attending Physician Name and Address:  Patrecia Pour, MD  Relative Name and Phone Number:  Best friend , Algis Liming [phone]    Current Level of Care: Hospital Recommended Level of Care: [place] Prior Approval Number:    Date Approved/Denied:   PASRR Number: pending  Discharge Plan: Home    Current Diagnoses: Patient Active Problem List   Diagnosis Date Noted  . Osteoporosis 04/02/2017  . Closed fracture of neck of left femur ([place]) 03/31/2017  . Scalp contusion 03/31/2017  . Fall at home, initial encounter 03/31/2017  . Hip fracture ([place]) 03/31/2017  . 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 Hx of transient ischemic attack (TIA) 02/19/2017  . Left carotid stenosis- 40-59% 02/19/2017  . Malnutrition of moderate degree 02/18/2017  . Falls 02/17/2017  . Chest pain 09/26/2016  . Diverticulitis of sigmoid colon 04/25/2016  . Infection due to ESBL-producing Escherichia coli/Diverticular Abscess 03/15/2016  . Diverticulitis 03/12/2016  . Chronic pain syndrome 03/12/2016  . Lesion of pancreas 03/12/2016  . History of chest pain 03/12/2016  . Hypokalemia 03/12/2016  . Angina, class IV ([place]) - chest tightness and pressure with dyspnea with minimal exertion 02/17/2016    Class: Question of  . TIA (transient ischemic attack) 12/26/2015  . DOE (dyspnea on exertion) 12/26/2015  . Right sided weakness 12/17/2015  . Ataxia 12/17/2015  . Shoulder fracture 02/15/2015  . Chronic headache 10/11/2013  . Weakness generalized 08/12/2013  . Dizziness and giddiness 08/12/2013  . Abnormality of gait  07/11/2013  . Hyperlipidemia 05/07/2007  . Migraine without aura 05/07/2007  . PREMATURE VENTRICULAR CONTRACTIONS, FREQUENT 05/07/2007  . GERD 05/07/2007  . DIVERTICULOSIS, COLON 05/07/2007  . 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 OSTEOPENIA 05/07/2007  . Anxiety and depression 03/24/2007  . HEMORRHOIDS 03/24/2007  . ALLERGIC RHINITIS 03/24/2007  . LOW BACK PAIN 03/24/2007  . MIGRAINES, HX OF 03/24/2007    Orientation RESPIRATION BLADDER Height & Weight     Self, Situation  Normal Continent Weight: 124 lb 9.6 oz (56.5 kg) Height:  5\' 5"  (165.1 cm)  BEHAVIORAL SYMPTOMS/MOOD NEUROLOGICAL BOWEL NUTRITION STATUS      Continent Diet (See DC Summary)  AMBULATORY STATUS COMMUNICATION OF NEEDS Skin   Limited Assist Verbally Surgical wounds                       Personal Care Assistance Level of Assistance  Dressing, Bathing Bathing Assistance: Limited assistance   Dressing Assistance: Maximum assistance     Functional Limitations Info             SPECIAL CARE FACTORS FREQUENCY  OT (By licensed OT), PT (By licensed PT)     PT Frequency: 7x week OT Frequency: 2x week            Contractures      Additional Factors Info  Code Status, Allergies, Psychotropic Code Status Info: Full code Allergies Info: PENICILLINS  Psychotropic Info:  prozac         Current Medications (04/03/2017):  This is the current hospital active medication list Current Facility-Administered Medications  Medication Dose Route Frequency Provider  Last Rate Last Dose  . 0.9 %  sodium chloride infusion   Intravenous Continuous Rosita Fire, MD   Stopped at 04/01/17 1235  . acetaminophen (TYLENOL) tablet 650 mg  650 mg Oral Q6H PRN Leandrew Koyanagi, MD       Or  . acetaminophen (TYLENOL) suppository 650 mg  650 mg Rectal Q6H PRN Leandrew Koyanagi, MD      . alum & mag hydroxide-simeth (MAALOX/MYLANTA) 200-200-20 MG/5ML suspension 30 mL  30 mL Oral Q4H PRN Leandrew Koyanagi, MD      .  clonazePAM Bobbye [place]) tablet 1 mg  1 mg Oral Daily PRN Rosita Fire, MD      . enoxaparin (LOVENOX) injection 40 mg  40 mg Subcutaneous Q24H Leandrew Koyanagi, MD   40 mg at 04/03/17 0818  . feeding supplement (ENSURE ENLIVE) (ENSURE ENLIVE) liquid 237 mL  237 mL Oral BID BM Rosita Fire, MD   237 mL at 04/03/17 0818  . FLUoxetine (PROZAC) capsule 40 mg  40 mg Oral Daily Rosita Fire, MD   40 mg at 04/03/17 0817  . fluticasone (FLONASE) 50 MCG/ACT nasal spray 2 spray  2 spray Each Nare Daily Rosita Fire, MD   2 spray at 04/03/17 4259  . haloperidol lactate (HALDOL) injection 2 mg  2 mg Intravenous Q6H PRN Opyd, Ilene Qua, MD   2 mg at 04/02/17 2324  . HYDROcodone-acetaminophen (NORCO/VICODIN) 5-325 MG per tablet 1-2 tablet  1-2 tablet Oral Q6H PRN Leandrew Koyanagi, MD   1 tablet at 04/02/17 0521  . lactated ringers infusion   Intravenous Continuous Rica Koyanagi, MD 10 mL/hr at 04/01/17 1049    . menthol-cetylpyridinium (CEPACOL) lozenge 3 mg  1 lozenge Oral PRN Leandrew Koyanagi, MD       Or  . phenol (CHLORASEPTIC) mouth spray 1 spray  1 spray Mouth/Throat PRN Leandrew Koyanagi, MD      . methocarbamol (ROBAXIN) tablet 500 mg  500 mg Oral Q6H PRN Leandrew Koyanagi, MD   500 mg at 04/03/17 5638   Or  . methocarbamol (ROBAXIN) 500 mg in dextrose 5 % 50 mL IVPB  500 mg Intravenous Q6H PRN Leandrew Koyanagi, MD      . metoCLOPramide (REGLAN) tablet 5-10 mg  5-10 mg Oral Q8H PRN Leandrew Koyanagi, MD       Or  . metoCLOPramide (REGLAN) injection 5-10 mg  5-10 mg Intravenous Q8H PRN Leandrew Koyanagi, MD      . morphine 4 MG/ML injection 0.52 mg  0.52 mg Intravenous Q2H PRN Rosita Fire, MD   0.52 mg at 04/02/17 0236  . morphine 4 MG/ML injection 0.52 mg  0.52 mg Intravenous Q2H PRN Leandrew Koyanagi, MD      . ondansetron [facility]) tablet 4 mg  4 mg Oral Q6H PRN Leandrew Koyanagi, MD       Or  . ondansetron [facility]) injection 4 mg  4 mg Intravenous Q6H PRN Leandrew Koyanagi, MD      .  oxyCODONE (Oxy IR/ROXICODONE) immediate release tablet 5 mg  5 mg Oral Q8H PRN Rosita Fire, MD   5 mg at 04/02/17 0908   And  . oxyCODONE-acetaminophen (PERCOCET/ROXICET) 5-325 MG per tablet 1 tablet  1 tablet Oral Q8H PRN Rosita Fire, MD   1 tablet at 04/02/17 0909  . oxyCODONE (Oxy IR/ROXICODONE) immediate release tablet 5-10 mg  5-10 mg Oral Q4H PRN  Leandrew Koyanagi, MD   10 mg at 04/03/17 0817  . polyethylene glycol (MIRALAX / GLYCOLAX) packet 17 g  17 g Oral Daily PRN Rosita Fire, MD      . topiramate (TOPAMAX) tablet 50 mg  50 mg Oral BID Rosita Fire, MD   50 mg at 04/03/17 0819  . traZODone (DESYREL) tablet 200 mg  200 mg Oral QHS PRN Rosita Fire, MD      . vitamin B-12 (CYANOCOBALAMIN) tablet 1,000 mcg  1,000 mcg Oral Daily Patrecia Pour, MD   1,000 mcg at 04/03/17 5427     Discharge Medications: Please see discharge summary for a list of discharge medications.  Relevant Imaging Results:  Relevant Lab Results:   Additional Information SS#:[id number]   Normajean Baxter, LCSW

## 2017-04-03 NOTE — Progress Notes (Signed)
Patient confused up out of bed by door." Stating I have to leave here it's not safe."When questioned patient if she could tell me where she was.Patient stated," [place] [address].I need to get to the hospital."Attempted to orient patient to hospital patient became upset and tearful stating ,"I am not crazy.I am not crazy." Assured patient that staff did not think that but concerned for her safety.Patient was assisted back to bed by nursing staff.Text paged Dr.Opyd.

## 2017-04-03 NOTE — Social Work (Signed)
PASSR pending.  CSW following up on 30 day note for rehab signed by doctor.  CSW will continue to follow.  Elissa Hefty, LCSW Clinical Social Worker [phone]

## 2017-04-03 NOTE — Clinical Social Work Note (Signed)
Clinical Social Work Assessment  Patient Details  Name: Dorothy White MRN: [id number] Date of Birth: [birth year]  Date of referral:  04/03/17               Reason for consult:  Facility Placement                Permission sought to share information with:  Chartered certified accountant granted to share information::  Yes, Verbal Permission Granted  Name::     Algis Liming  Agency::  SNF  Relationship::  friend  Contact Information:     Housing/Transportation Living arrangements for the past 2 months:  [place] of Information:  Patient Patient Interpreter Needed:  None Criminal Activity/Legal Involvement Pertinent to Current Situation/Hospitalization:  No - Comment as needed Significant Relationships:  Friend, Other Family Members Lives with:  Self Do you feel safe going back to the place where you live?  No Need for family participation in patient care:  No (Coment)  Care giving concerns:  Pt stated that she was from home alone and managed all ADL's independently. CSW confirmed that patient not safe to return at this time as she resides alone. Pt seemed confused at times, but alert enough to confirm SNF placement. Unclear as to her ability to maintain independent living prior to hospitalization. Pt gave permission for CSW to speak with friend.  Social Worker assessment / plan:  CSW met with patient at bedside to discuss clinical team's recommendation for SNF placement at DC. Pt in agreement. CSW explained her role, SNf options/placement and insurance authorization. CSW obtained verbal permission to send out offers.  FL2 pending as pt will need PASSR. CSW will f/u. Offers sent to SNF's.  Employment status:  Disabled (Comment on whether or not currently receiving Disability), Retired Nurse, adult PT Recommendations:  [place] / Referral to community resources:  [place]  Patient/Family's Response to care:  Patient has agreed to [employer]. Pt has not reported any issues with care.  Patient/Family's Understanding of and Emotional Response to Diagnosis, Current Treatment, and Prognosis:  Patient has some understanding of diagnosis, current treatment and prognosis and patient anxious about returning home. No issues or concerns identified at this time.  Emotional Assessment Appearance:  Appears stated age Attitude/Demeanor/Rapport:   (Cooperative, somewhat confused) Affect (typically observed):    Orientation:  Oriented to Self, Oriented to Situation Alcohol / Substance use:  Not Applicable Psych involvement (Current and /or in the community):  No (Comment)  Discharge Needs  Concerns to be addressed:  Care Coordination Readmission within the last 30 days:  No Current discharge risk:  Dependent with Mobility, Physical Impairment, Cognitively Impaired Barriers to Discharge:  No Barriers Identified   Normajean Baxter, LCSW 04/03/2017, 12:55 PM

## 2017-04-04 DIAGNOSIS — D62 Acute posthemorrhagic anemia: Secondary | ICD-10-CM | POA: Diagnosis not present

## 2017-04-04 DIAGNOSIS — R410 Disorientation, unspecified: Secondary | ICD-10-CM | POA: Diagnosis not present

## 2017-04-04 LAB — CBC
HCT: 24.6 % — ABNORMAL LOW (ref 36.0–46.0)
Hemoglobin: 7.9 g/dL — ABNORMAL LOW (ref 12.0–15.0)
MCH: 27.4 pg (ref 26.0–34.0)
MCHC: 32.1 g/dL (ref 30.0–36.0)
MCV: 85.4 fL (ref 78.0–100.0)
PLATELETS: 160 10*3/uL (ref 150–400)
RBC: 2.88 MIL/uL — ABNORMAL LOW (ref 3.87–5.11)
RDW: 13.3 % (ref 11.5–15.5)
WBC: 6.3 10*3/uL (ref 4.0–10.5)

## 2017-04-04 LAB — PREPARE RBC (CROSSMATCH)

## 2017-04-04 MED ORDER — SODIUM CHLORIDE 0.9 % IV SOLN
Freq: Once | INTRAVENOUS | Status: AC
  Administered 2017-04-04: 09:00:00 via INTRAVENOUS

## 2017-04-04 NOTE — Progress Notes (Signed)
PROGRESS NOTE  Dorothy White  EGB:[id number] DOB: 27-Nov-1950 DOA: 03/31/2017 PCP: Maurice Small, MD   Brief Narrative: Dorothy White is a 66 y.o. female with a history of depression, anxiety, migraine, moderate malnutrition, TIA, carotid artery stenosis and frequent falls who presented with another fall, having slipped in the kitchen, striking head and landing on left side with immediate left hip pain. Fall work up during recent admission in July 2018 with echo, MRI, and carotid imaging was unremarkable. On arrival she had a scalp laceration not requiring repair and XR revealed left hip fracture. She was admitted and underwent left THA with Dr. Erlinda Hong 9/5. Post operative course was complicated by acute delirium, since resolved, and anemia, receiving 1u PRBCs 9/8.   Assessment & Plan: Principal Problem:   Closed fracture of neck of left femur [facility]) Active Problems:   Scalp contusion   Fall at home, initial encounter   Hip fracture [facility])   Osteoporosis  Closed left hip fracture: s/p left THA 9/5 with Dr. Erlinda Hong. Fragility fracture in patient with osteoporosis.  - WBAT, will have dressing removed on POD #7, then staple removal on POD #10-14. Apply betadine to incision twice daily after dressing removal.  - Vit D normal at 35.5. - Tmax over past 24 hours 100.48F, will monitor for signs/symptoms of infection. If delirium returns, check UA. If febrile >101.48F, check blood cultures.   Acute blood loss anemia on pernicious anemia: Post-op, as expected. Baseline hemoglobin is normal >12mg /dl.  - Monitor CBC and symptoms. Now lightheaded with ongoing downward trending of Hgb, now 7.9%. Will transfuse 1u PRBCs for symptomatic postoperative anemia.  - No other sources of bleeding noted.   Frequent falls: Patient with history of recurrent fall and admitted in July this year. Patient had MRI/MRA of brain which was unremarkable. Patient is known to have left internal carotid artery stenosis. Echocardiogram  with normal systolic function, cardiac cath 2017 showed mild LAD lesion. No prodromal symptoms/LOC. - Had negative CT head at admission - Discharge to SNF per PT/OT recommendations.   Acute kidney injury: SCr elevated modestly at admission to 1.03, though this represents a significant elevation from baseline, as evidenced by improvement thus far to 0.65.  - Monitor  QT prolongation: Resolved, QTc 414msec 9/5.  - Monitor, not on suboxone.   History of substance abuse:  - Pt no longer on suboxone. - UDS +amphetamines (no known prescriptions would cause this) and benzodiazepines (Rx klonopin).  Acute delirium: Related to pain, narcotics. Resolved. Negative head CT at admission, nonfocal exam. Recent TSH wnl. ?delayed withdrawal from substances.  - Sitter no longer required.  - Minimize sedating medications as able.  - Monitor for other stigmata of withdrawal, though not typical of any specific agent.  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 Recommended to continue to follow up. Continue vitamin B12  History of migraine: Seen by neurologist in the past, has hemiplegic symptoms with these.  - Continue topamax.  Moderate protein calorie malnutrition:  - Nutrition consulted - Continue feeding supplement.  DVT prophylaxis: Lovenox per orthopedics Code Status: Full Family Communication: None at bedside Disposition Plan: SNF once bed available, 30-day note and FL-2 signed, hopeful for dispo 9/10.   Consultants:   Orthopedics, Dr. Erlinda Hong  Procedures:   04/01/2017, Dr. Erlinda Hong: LEFT DIRECT ANTERIOR TOTAL HIP REPLACEMENT   Antimicrobials:  None   Subjective: Delirium seems to have passed. Still no weakness, numbness or tingling. No overnight  events reported by RN. Started feeling light headed but no dyspena or chest pain. Ambulated to chair with assistance and RW.   Objective: BP (!) 108/50 (BP Location: Left Arm)    Pulse 86   Temp 98.5 F (36.9 C) (Oral)   Resp 16   Ht 5\' 5"  (1.651 m)   Wt 56.5 kg (124 lb 9.6 oz)   SpO2 97%   BMI 20.73 kg/m   Gen: Pleasant 66 y.o. female in no distress  Pulm: Non-labored breathing room air. Clear to auscultation bilaterally.  CV: Regular rate and rhythm, borderline tachycardia. No murmur, rub, or gallop. No JVD, no pedal edema. GI: Abdomen soft, non-tender, non-distended, with normoactive bowel sounds. No organomegaly or masses felt. Ext: Warm, no deformities. Left thigh compartment soft, strength 5/5, SILT, brisk cap refill.  Skin: Left hip dressing c/d/i. No bleeding or extensive ecchymoses or purpura noted.  Neuro: Alert and oriented this morning. No focal neurological deficits. Psych: Judgement and insight appear normal. Mood & affect appropriate.   CBC:  Recent Labs Lab 03/31/17 1511 04/01/17 1640 04/02/17 0402 04/03/17 0422 04/04/17 0336  WBC 9.7 8.0 9.8 9.6 6.3  NEUTROABS 8.6*  --   --   --   --   HGB 14.0 11.4* 9.2* 8.4* 7.9*  HCT 43.7 35.6* 28.3* 26.2* 24.6*  MCV 87.1 88.1 86.5 86.8 85.4  PLT 205 180 192 158 449   Basic Metabolic Panel:  Recent Labs Lab 03/31/17 1511 04/01/17 1640 04/02/17 0402 04/03/17 0422  NA 140  --  140 138  K 4.0  --  3.6 3.6  CL 107  --  112* 111  CO2 24  --  22 21*  GLUCOSE 97  --  115* 102*  BUN 14  --  9 8  CREATININE 1.03* 0.79 0.65 0.64  CALCIUM 9.4  --  8.2* 8.2*   GFR: Estimated Creatinine Clearance: 62.5 mL/min (by C-G formula based on SCr of 0.64 mg/dL). Liver Function Tests:  Recent Labs Lab 03/31/17 1835  AST 15  ALT 15  ALKPHOS 94  BILITOT 0.8  PROT 6.8  ALBUMIN 3.9    Time spent: 35 minutes.  Vance Gather, MD [facility] Pager [phone]  If 7PM-7AM, please contact night-coverage [email or web address] Password [facility] 04/04/2017, 9:44 AM

## 2017-04-04 NOTE — Plan of Care (Signed)
Problem: Pain Management: Goal: Pain level will decrease Outcome: Progressing Pain medication given once this shift so far.  Will continue to monitor and assess pain levels.

## 2017-04-04 NOTE — Progress Notes (Signed)
Physical Therapy Treatment Patient Details Name: Dorothy White MRN: [id number] DOB: 1951-02-09 Today's Date: 04/04/2017    History of Present Illness      PT Comments    Pt doing better today - walked farther, did exercises.  Pt still anxious and impulsive. She will benefit from skilled PT services to regain function and safety.  She still requires 24/7 supervision/assist for her safety  Follow Up Recommendations  SNF;Supervision/Assistance - 24 hour     Equipment Recommendations       Recommendations for Other Services       Precautions / Restrictions Precautions Precautions: Fall Precaution Comments: pt feeling a little dizzy today.  she got unit of blood earlier - BP now is 111/52 Restrictions Weight Bearing Restrictions: Yes LLE Weight Bearing: Weight bearing as tolerated    Mobility  Bed Mobility Overal bed mobility: Needs Assistance Bed Mobility: Supine to Sit     Supine to sit: Min assist     General bed mobility comments: pt needed cues for hand placement and cues to scoot forward when getting up  Transfers Overall transfer level: Needs assistance Equipment used: Rolling walker (2 wheeled) Transfers: Sit to/from Stand Sit to Stand: Min assist         General transfer comment: cued hands and transition to walker.  reveiwed each time she stood up and sat down  Ambulation/Gait Ambulation/Gait assistance: Min assist Ambulation Distance (Feet): 80 Feet Assistive device: Rolling walker (2 wheeled) Gait Pattern/deviations: Step-through pattern;Decreased stride length;Trunk flexed;Antalgic         Stairs            Wheelchair Mobility    Modified Rankin (Stroke Patients Only)       Balance Overall balance assessment: History of Falls;Needs assistance                                          Cognition Arousal/Alertness: Awake/alert Behavior During Therapy: Flat affect;Anxious Overall Cognitive Status:  Impaired/Different from baseline                                 General Comments: pt calm and cooperative today. she is impulsive - kept wanting to get up before i was ready. she has Actuary for safety      Exercises Total Joint Exercises Long Arc Quad: AROM;Both;10 reps Marching in Standing: AROM;Left;10 reps Other Exercises Other Exercises: pt standing with RW did aROM left hamstring curls x 10 reps Other Exercises: pt standing with RW did heel raises x 10 reps.    General Comments        Pertinent Vitals/Pain Pain Assessment: 0-10 Pain Score: 5  Pain Location: L hip Pain Descriptors / Indicators: Aching;Sore;Operative site guarding Pain Intervention(s): Monitored during session;Repositioned;Ice applied    Home Living                      Prior Function            PT Goals (current goals can now be found in the care plan section) Progress towards PT goals: Progressing toward goals    Frequency    7X/week      PT Plan Current plan remains appropriate    Co-evaluation              AM-PAC PT "6 Clicks"  Daily Activity  Outcome Measure  Difficulty turning over in bed (including adjusting bedclothes, sheets and blankets)?: A Little Difficulty moving from lying on back to sitting on the side of the bed? : A Little Difficulty sitting down on and standing up from a chair with arms (e.g., wheelchair, bedside commode, etc,.)?: A Little Help needed moving to and from a bed to chair (including a wheelchair)?: A Little Help needed walking in hospital room?: A Little Help needed climbing 3-5 steps with a railing? : A Little 6 Click Score: 18    End of Session Equipment Utilized During Treatment: Gait belt Activity Tolerance: Patient tolerated treatment well Patient left: in chair;with call bell/phone within reach;with nursing/sitter in room Nurse Communication: Mobility status PT Visit Diagnosis: Other abnormalities of gait and mobility  (R26.89)     Time: 1530-1600 PT Time Calculation (min) (ACUTE ONLY): 30 min  Charges:  $Gait Training: 8-22 mins $Therapeutic Exercise: 8-22 mins                    G Codes:       11-Apr-2017   Rande Lawman, PT    Yesica, [place] Apr 11, 2017, 4:10 PM

## 2017-04-04 NOTE — Progress Notes (Signed)
Subjective: Patient stable.  Pain controlled.   Objective: Vital signs in last 24 hours: Temp:  [99.4 F (37.4 C)-100.5 F (38.1 C)] 100.5 F (38.1 C) (09/08 0636) Pulse Rate:  [85-86] 86 (09/08 0636) Resp:  [16-18] 16 (09/08 0636) BP: (97-105)/(39-44) 104/41 (09/08 0636) SpO2:  [95 %-97 %] 97 % (09/08 0636)  Intake/Output from previous day: 09/07 0701 - 09/08 0700 In: 365 [P.O.:300; I.V.:65] Out: -  Intake/Output this shift: No intake/output data recorded.  Exam:  Dorsiflexion/Plantar flexion intact  Labs:  Recent Labs  04/01/17 1640 04/02/17 0402 04/03/17 0422 04/04/17 0336  HGB 11.4* 9.2* 8.4* 7.9*    Recent Labs  04/03/17 0422 04/04/17 0336  WBC 9.6 6.3  RBC 3.02* 2.88*  HCT 26.2* 24.6*  PLT 158 160    Recent Labs  04/02/17 0402 04/03/17 0422  NA 140 138  K 3.6 3.6  CL 112* 111  CO2 22 21*  BUN 9 8  CREATININE 0.65 0.64  GLUCOSE 115* 102*  CALCIUM 8.2* 8.2*   No results for input(s): LABPT, INR in the last 72 hours.  Assessment/Plan: Plan at this time is discharge planning for placement.  She is making good progress with mobilization   G Alphonzo Severance 04/04/2017, 8:53 AM

## 2017-04-05 DIAGNOSIS — F329 Major depressive disorder, single episode, unspecified: Secondary | ICD-10-CM

## 2017-04-05 DIAGNOSIS — F419 Anxiety disorder, unspecified: Secondary | ICD-10-CM

## 2017-04-05 LAB — TYPE AND SCREEN
ABO/RH(D): O POS
ANTIBODY SCREEN: NEGATIVE
Unit division: 0

## 2017-04-05 LAB — CBC
HEMATOCRIT: 27.8 % — AB (ref 36.0–46.0)
HEMOGLOBIN: 9.3 g/dL — AB (ref 12.0–15.0)
MCH: 28.5 pg (ref 26.0–34.0)
MCHC: 33.5 g/dL (ref 30.0–36.0)
MCV: 85.3 fL (ref 78.0–100.0)
Platelets: 190 10*3/uL (ref 150–400)
RBC: 3.26 MIL/uL — ABNORMAL LOW (ref 3.87–5.11)
RDW: 14 % (ref 11.5–15.5)
WBC: 5.4 10*3/uL (ref 4.0–10.5)

## 2017-04-05 LAB — BPAM RBC
Blood Product Expiration Date: 201810042359
ISSUE DATE / TIME: 201809081148
UNIT TYPE AND RH: 5100

## 2017-04-05 MED ORDER — TRAZODONE HCL 100 MG PO TABS
300.0000 mg | ORAL_TABLET | Freq: Every evening | ORAL | Status: DC | PRN
  Administered 2017-04-05: 300 mg via ORAL
  Filled 2017-04-05: qty 3

## 2017-04-05 NOTE — Progress Notes (Signed)
PROGRESS NOTE  Dorothy White  RJJ:[id number] DOB: 1951/07/22 DOA: 03/31/2017 PCP: Maurice Small, MD   Brief Narrative: Dorothy White is a 66 y.o. female with a history of depression, anxiety, migraine, moderate malnutrition, TIA, carotid artery stenosis and frequent falls who presented with another fall, having slipped in the kitchen, striking head and landing on left side with immediate left hip pain. Fall work up during recent admission in July 2018 with echo, MRI, and carotid imaging was unremarkable. On arrival she had a scalp laceration not requiring repair and XR revealed left hip fracture. She was admitted and underwent left THA with Dr. Erlinda Hong 9/5. Post operative course was complicated by acute delirium, since resolved, and anemia, received 1u PRBCs 9/8 with improvement of hemoglobin to 9.3.   Assessment & Plan: Principal Problem:   Closed fracture of neck of left femur (HCC) Active Problems:   Anxiety and depression   Scalp contusion   Fall at home, initial encounter   Osteoporosis   Acute blood loss as cause of postoperative anemia   Acute delirium  Closed left hip fracture: s/p left THA 9/5 with Dr. Erlinda Hong. Fragility fracture in patient with osteoporosis.  - WBAT, will have dressing removed on POD #7, then staple removal on POD #10-14. Apply betadine to incision twice daily after dressing removal.  - Vit D normal at 35.5. - Afebrile. If delirium returns, check UA. If febrile >101.69F, check blood cultures.   Acute blood loss anemia on pernicious anemia: Post-op, as expected. Baseline hemoglobin is normal >12mg /dl.  - Monitor CBC weekly or prn bleeding. Ecchymosis resolving. Hgb 9.3 s/p 1u PRBCs on 9/8.   Frequent falls: Patient with history of recurrent fall and admitted in July this year. Patient had MRI/MRA of brain which was unremarkable. Patient is known to have left internal carotid artery stenosis. Echocardiogram with normal systolic function, cardiac cath 2017 showed mild  LAD lesion. No prodromal symptoms/LOC. - Had negative CT head at admission - Discharge to SNF per PT/OT recommendations.   Acute kidney injury: SCr elevated modestly at admission to 1.03, though this represents a significant elevation from baseline, as evidenced by improvement thus far to 0.64.  - Monitor in 1 week.  QT prolongation: Resolved, QTc 418msec 9/5.  - Monitor, not on suboxone.   History of substance abuse:  - Pt no longer on suboxone. - UDS +amphetamines (no known prescriptions would cause this) and benzodiazepines (Rx klonopin).  Acute delirium: Related to pain, narcotics. Resolved. Negative head CT at admission, nonfocal exam. Recent TSH wnl.  - Minimize sedating medications as able.  - Monitor for other stigmata of withdrawal, though not typical of any specific agent.  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 Recommended to continue to follow up. Continue vitamin B12  History of migraine: Seen by neurologist in the past, has hemiplegic symptoms with these.  - Continue topamax.  Moderate protein calorie malnutrition:  - Nutrition consulted - Continue feeding supplement.  DVT prophylaxis: Lovenox per orthopedics Code Status: Full Family Communication: None at bedside Disposition Plan: SNF once bed available, 30-day note and FL-2 signed, hopeful for dispo 9/10.   Consultants:   Orthopedics, Dr. Erlinda Hong  Procedures:   04/01/2017, Dr. Erlinda Hong: LEFT DIRECT ANTERIOR TOTAL HIP REPLACEMENT   Antimicrobials:  None   Subjective: Reports pain is controlled, bruise on left thigh stable, no lightheadedness or fever.  Objective: BP (!) 101/51 (BP Location: Left Arm)   Pulse 76  Temp 98.2 F (36.8 C) (Oral)   Resp 16   Ht 5\' 5"  (1.651 m)   Wt 56.5 kg (124 lb 9.6 oz)   SpO2 97%   BMI 20.73 kg/m   Gen: Pleasant 66 y.o. female in no distress sitting up in bed. Pulm: Non-labored breathing room air.  Clear to auscultation bilaterally.  CV: Regular rate and rhythm. No murmur, rub, or gallop. No JVD, no pedal edema. GI: Abdomen soft, non-tender, non-distended, with normoactive bowel sounds. No organomegaly or masses felt. Ext: Warm, no deformities. Left thigh compartment soft, strength 5/5, SILT, brisk cap refill.  Skin: Left hip dressing c/d/i. No bleeding. Posterolateral thigh with healing ecchymosis, no expansion from previous exams.  Neuro: Alert and oriented. No focal neurological deficits. Psych: Judgement and insight appear normal. Mood euthymic & affect appropriate.   CBC:  Recent Labs Lab 03/31/17 1511 04/01/17 1640 04/02/17 0402 04/03/17 0422 04/04/17 0336 04/05/17 0336  WBC 9.7 8.0 9.8 9.6 6.3 5.4  NEUTROABS 8.6*  --   --   --   --   --   HGB 14.0 11.4* 9.2* 8.4* 7.9* 9.3*  HCT 43.7 35.6* 28.3* 26.2* 24.6* 27.8*  MCV 87.1 88.1 86.5 86.8 85.4 85.3  PLT 205 180 192 158 160 338   Basic Metabolic Panel:  Recent Labs Lab 03/31/17 1511 04/01/17 1640 04/02/17 0402 04/03/17 0422  NA 140  --  140 138  K 4.0  --  3.6 3.6  CL 107  --  112* 111  CO2 24  --  22 21*  GLUCOSE 97  --  115* 102*  BUN 14  --  9 8  CREATININE 1.03* 0.79 0.65 0.64  CALCIUM 9.4  --  8.2* 8.2*   GFR: Estimated Creatinine Clearance: 62.5 mL/min (by C-G formula based on SCr of 0.64 mg/dL). Liver Function Tests:  Recent Labs Lab 03/31/17 1835  AST 15  ALT 15  ALKPHOS 94  BILITOT 0.8  PROT 6.8  ALBUMIN 3.9    Time spent: 25 minutes.  Vance Gather, MD [facility] Pager [phone]  If 7PM-7AM, please contact night-coverage [email or web address] Password [email or web address] 04/05/2017, 2:09 PM

## 2017-04-05 NOTE — Progress Notes (Signed)
Physical Therapy Treatment Patient Details Name: Dorothy White MRN: [id number] DOB: 04-Oct-1950 Today's Date: 04/05/2017    History of Present Illness Dorothy Fanguy Hunteris a 66 y.o.femalewith medical history significant of anxiety depression, migraine, moderate degree malnutrition, TIA, carotid artery stenosis was admitted on July this year for frequent falls when workup was unremarkable presented with fall sustaining scalp laceration and hip injury. Patient reported that she was doing well until when she slipped in the kitchen and hit her head and left hip. Pt s/p direct anterior L THA.    PT Comments    Patient is making progress toward mobility goals. Pt tolerated gait training and LE therex well. Pt continues to demonstrate balance deficits and decreased awareness of deficits at times.  Continue to progress as tolerated.   Follow Up Recommendations  SNF;Supervision/Assistance - 24 hour     Equipment Recommendations  Rolling walker with 5" wheels    Recommendations for Other Services       Precautions / Restrictions Precautions Precautions: Fall Restrictions Weight Bearing Restrictions: No LLE Weight Bearing: Weight bearing as tolerated    Mobility  Bed Mobility Overal bed mobility: Needs Assistance Bed Mobility: Supine to Sit     Supine to sit: Min guard     General bed mobility comments: min guard for safety  Transfers Overall transfer level: Needs assistance Equipment used: Rolling walker (2 wheeled) Transfers: Sit to/from Stand Sit to Stand: Min guard         General transfer comment: cues for safe hand placement; min guard upon standing due to c/o dizziness and a little unsteady but no LOB  Ambulation/Gait Ambulation/Gait assistance: Min assist Ambulation Distance (Feet): 90 Feet Assistive device: Rolling walker (2 wheeled) Gait Pattern/deviations: Step-through pattern;Trunk flexed;Shuffle;Decreased stance time - left;Decreased step length -  left;Decreased step length - right Gait velocity: reduced   General Gait Details: pt with shuffling gait initially but able to improve stride length and bilat heel strike with vc; min A to steady especially with turns/directional changes and navigating hallway; pt running into objects on L side   Stairs            Wheelchair Mobility    Modified Rankin (Stroke Patients Only)       Balance Overall balance assessment: History of Falls;Needs assistance Sitting-balance support: Feet supported Sitting balance-Leahy Scale: Good     Standing balance support: Bilateral upper extremity supported;During functional activity Standing balance-Leahy Scale: Poor                              Cognition Arousal/Alertness: Awake/alert Behavior During Therapy: Anxious;WFL for tasks assessed/performed Overall Cognitive Status: Within Functional Limits for tasks assessed                                        Exercises Total Joint Exercises Quad Sets: AROM;Both;10 reps Short Arc Quad: AROM;Left;10 reps Heel Slides: AROM;Left;10 reps Hip ABduction/ADduction: AROM;10 reps;Left Long Arc Quad: AROM;Both;10 reps;Seated    General Comments        Pertinent Vitals/Pain Pain Assessment: Faces Faces Pain Scale: Hurts little more Pain Location: L hip with mobility Pain Descriptors / Indicators: Sore;Guarding Pain Intervention(s): Limited activity within patient's tolerance;Monitored during session;Premedicated before session;Repositioned;Ice applied    Home Living  Prior Function            PT Goals (current goals can now be found in the care plan section) Acute Rehab PT Goals PT Goal Formulation: With patient Time For Goal Achievement: 04/16/17 Potential to Achieve Goals: Good Progress towards PT goals: Progressing toward goals    Frequency    7X/week      PT Plan Current plan remains appropriate     Co-evaluation              AM-PAC PT "6 Clicks" Daily Activity  Outcome Measure  Difficulty turning over in bed (including adjusting bedclothes, sheets and blankets)?: A Little Difficulty moving from lying on back to sitting on the side of the bed? : A Little Difficulty sitting down on and standing up from a chair with arms (e.g., wheelchair, bedside commode, etc,.)?: A Little Help needed moving to and from a bed to chair (including a wheelchair)?: A Little Help needed walking in hospital room?: A Little Help needed climbing 3-5 steps with a railing? : A Little 6 Click Score: 18    End of Session Equipment Utilized During Treatment: Gait belt Activity Tolerance: Patient tolerated treatment well Patient left: in chair;with call bell/phone within reach;with family/visitor present Nurse Communication: Mobility status PT Visit Diagnosis: Other abnormalities of gait and mobility (R26.89)     Time: 1545-1610 PT Time Calculation (min) (ACUTE ONLY): 25 min  Charges:  $Gait Training: 8-22 mins $Therapeutic Exercise: 8-22 mins                    G Codes:       Earney Navy, PTA Pager: [phone]     Darliss Cheney 04/05/2017, 4:24 PM

## 2017-04-06 DIAGNOSIS — E440 Moderate protein-calorie malnutrition: Secondary | ICD-10-CM | POA: Diagnosis not present

## 2017-04-06 DIAGNOSIS — Z9181 History of falling: Secondary | ICD-10-CM | POA: Diagnosis not present

## 2017-04-06 DIAGNOSIS — G629 Polyneuropathy, unspecified: Secondary | ICD-10-CM | POA: Diagnosis not present

## 2017-04-06 DIAGNOSIS — G43419 Hemiplegic migraine, intractable, without status migrainosus: Secondary | ICD-10-CM | POA: Diagnosis not present

## 2017-04-06 DIAGNOSIS — R2681 Unsteadiness on feet: Secondary | ICD-10-CM | POA: Diagnosis not present

## 2017-04-06 DIAGNOSIS — W19XXXA Unspecified fall, initial encounter: Secondary | ICD-10-CM | POA: Diagnosis not present

## 2017-04-06 DIAGNOSIS — F329 Major depressive disorder, single episode, unspecified: Secondary | ICD-10-CM | POA: Diagnosis not present

## 2017-04-06 DIAGNOSIS — R1311 Dysphagia, oral phase: Secondary | ICD-10-CM | POA: Diagnosis not present

## 2017-04-06 DIAGNOSIS — F339 Major depressive disorder, recurrent, unspecified: Secondary | ICD-10-CM | POA: Diagnosis not present

## 2017-04-06 DIAGNOSIS — S72002A Fracture of unspecified part of neck of left femur, initial encounter for closed fracture: Secondary | ICD-10-CM | POA: Diagnosis not present

## 2017-04-06 DIAGNOSIS — S0003XA Contusion of scalp, initial encounter: Secondary | ICD-10-CM | POA: Diagnosis not present

## 2017-04-06 DIAGNOSIS — D62 Acute posthemorrhagic anemia: Secondary | ICD-10-CM | POA: Diagnosis not present

## 2017-04-06 DIAGNOSIS — G4700 Insomnia, unspecified: Secondary | ICD-10-CM | POA: Diagnosis not present

## 2017-04-06 DIAGNOSIS — M6281 Muscle weakness (generalized): Secondary | ICD-10-CM | POA: Diagnosis not present

## 2017-04-06 DIAGNOSIS — I6529 Occlusion and stenosis of unspecified carotid artery: Secondary | ICD-10-CM | POA: Diagnosis not present

## 2017-04-06 DIAGNOSIS — M8000XA Age-related osteoporosis with current pathological fracture, unspecified site, initial encounter for fracture: Secondary | ICD-10-CM | POA: Diagnosis not present

## 2017-04-06 DIAGNOSIS — M810 Age-related osteoporosis without current pathological fracture: Secondary | ICD-10-CM | POA: Diagnosis not present

## 2017-04-06 DIAGNOSIS — R410 Disorientation, unspecified: Secondary | ICD-10-CM | POA: Diagnosis not present

## 2017-04-06 DIAGNOSIS — Z9189 Other specified personal risk factors, not elsewhere classified: Secondary | ICD-10-CM | POA: Diagnosis not present

## 2017-04-06 DIAGNOSIS — N179 Acute kidney failure, unspecified: Secondary | ICD-10-CM | POA: Diagnosis not present

## 2017-04-06 DIAGNOSIS — Y92009 Unspecified place in unspecified non-institutional (private) residence as the place of occurrence of the external cause: Secondary | ICD-10-CM | POA: Diagnosis not present

## 2017-04-06 DIAGNOSIS — F418 Other specified anxiety disorders: Secondary | ICD-10-CM | POA: Diagnosis not present

## 2017-04-06 DIAGNOSIS — S72002D Fracture of unspecified part of neck of left femur, subsequent encounter for closed fracture with routine healing: Secondary | ICD-10-CM | POA: Diagnosis not present

## 2017-04-06 DIAGNOSIS — G43909 Migraine, unspecified, not intractable, without status migrainosus: Secondary | ICD-10-CM | POA: Diagnosis not present

## 2017-04-06 DIAGNOSIS — R2689 Other abnormalities of gait and mobility: Secondary | ICD-10-CM | POA: Diagnosis not present

## 2017-04-06 DIAGNOSIS — W19XXXS Unspecified fall, sequela: Secondary | ICD-10-CM | POA: Diagnosis not present

## 2017-04-06 DIAGNOSIS — R488 Other symbolic dysfunctions: Secondary | ICD-10-CM | POA: Diagnosis not present

## 2017-04-06 DIAGNOSIS — J309 Allergic rhinitis, unspecified: Secondary | ICD-10-CM | POA: Diagnosis not present

## 2017-04-06 DIAGNOSIS — R296 Repeated falls: Secondary | ICD-10-CM | POA: Diagnosis not present

## 2017-04-06 DIAGNOSIS — Z96642 Presence of left artificial hip joint: Secondary | ICD-10-CM | POA: Diagnosis not present

## 2017-04-06 DIAGNOSIS — S92153A Displaced avulsion fracture (chip fracture) of unspecified talus, initial encounter for closed fracture: Secondary | ICD-10-CM | POA: Diagnosis not present

## 2017-04-06 DIAGNOSIS — R55 Syncope and collapse: Secondary | ICD-10-CM | POA: Diagnosis not present

## 2017-04-06 DIAGNOSIS — F419 Anxiety disorder, unspecified: Secondary | ICD-10-CM | POA: Diagnosis not present

## 2017-04-06 MED ORDER — TRAZODONE HCL 300 MG PO TABS: 300.0000 mg | ORAL_TABLET | Freq: Every evening | ORAL | Status: DC | PRN

## 2017-04-06 MED ORDER — CLONAZEPAM 1 MG PO TABS: 1.0000 mg | ORAL_TABLET | Freq: Every day | ORAL | 0 refills | Status: DC | PRN

## 2017-04-06 MED ORDER — SENNA 8.6 MG PO TABS: 1.0000 | ORAL_TABLET | Freq: Every day | ORAL | 0 refills | Status: DC

## 2017-04-06 NOTE — Social Work (Addendum)
PASSR # obtained [id number].  CSW will f/u with patient on bed offers and obtaining insurance auth for SNF placement.  Pt indicated that her family will be at hospital shortly and they will discuss SNF bed offers. CSS reiterated the importance of selected a SNF early on as insurance auth will need to be obtained. Pt in agreement.  CSW called [employer] and awaiting Insurance Auth for SNF placement.  2:11pm: Insurance [phone] received from [place] at [employer].  CSW will continue to follow for dispositon.  Elissa Hefty, LCSW Clinical Social Worker [phone]

## 2017-04-06 NOTE — Progress Notes (Signed)
RN called report to SNF.  

## 2017-04-06 NOTE — Progress Notes (Signed)
Pt states she has two rings and a bracelet locked in a safe from admission. RN called security and they stated they do not have anything locked for this pt.

## 2017-04-06 NOTE — Consult Note (Signed)
   [facility] Inpatient Consult   04/06/2017  Dorothy White Dec 14, 1950 [id number]  Chart reviewed for multiple ED visits and [facility] Care management out reaches in the [facility] [employer] plan.  Chart reveals that the patient is going to [place] skilled facility for rehab. Her primary care provider is Dr. Maurice Small of [facility].  Her primary care provider are listed to provide the transition of care follow up for this patient.  No noted community [facility] Care Management needs at this current time.   Natividad Brood, RN BSN [facility] Liaison  [phone] business mobile phone Toll free office [phone]

## 2017-04-06 NOTE — Progress Notes (Signed)
Physical Therapy Treatment Patient Details Name: Dorothy White MRN: [id number] DOB: 1951-07-21 Today's Date: 04/06/2017    History of Present Illness Dorothy Hay Hunteris a 66 y.o.femalewith medical history significant of anxiety depression, migraine, moderate degree malnutrition, TIA, carotid artery stenosis was admitted on July this year for frequent falls when workup was unremarkable presented with fall sustaining scalp laceration and hip injury. Patient reported that she was doing well until when she slipped in the kitchen and hit her head and left hip. Pt s/p direct anterior L THA.    PT Comments    Pt performed increased mobility during session this am.  Pt remains to require max VCs for safety during session.  Pt unaware of obstacles in halls at times and proves to require supervision for safety as her pain distracts her.  Pt will continue to benefit from skilled rehab short term at a SNF to improved strength and decrease pain so she can return home safely.    Follow Up Recommendations  SNF;Supervision/Assistance - 24 hour     Equipment Recommendations  Rolling walker with 5" wheels    Recommendations for Other Services       Precautions / Restrictions Precautions Precautions: Fall Precaution Comments: pt feeling a little dizzy today.  she got unit of blood earlier - BP now is 111/52 Restrictions Weight Bearing Restrictions: No LLE Weight Bearing: Weight bearing as tolerated    Mobility  Bed Mobility Overal bed mobility: Needs Assistance Bed Mobility: Supine to Sit     Supine to sit: Min guard     General bed mobility comments: min guard for safety  Transfers Overall transfer level: Needs assistance Equipment used: Rolling walker (2 wheeled) Transfers: Sit to/from Stand Sit to Stand: Supervision         General transfer comment: Pt remains to required cues for safe hand placement to and from seated surface.    Ambulation/Gait Ambulation/Gait  assistance: Supervision Ambulation Distance (Feet): 110 Feet Assistive device: Rolling walker (2 wheeled) Gait Pattern/deviations: Step-to pattern;Step-through pattern;Trunk flexed;Antalgic     General Gait Details: Cues for negotiating obstacles and increasing stride on R for progression to step through pattern.     Stairs            Wheelchair Mobility    Modified Rankin (Stroke Patients Only)       Balance Overall balance assessment: History of Falls;Needs assistance   Sitting balance-Leahy Scale: Good       Standing balance-Leahy Scale: Poor Standing balance comment: less than fair dynamically                            Cognition Arousal/Alertness: Awake/alert Behavior During Therapy: WFL for tasks assessed/performed Overall Cognitive Status: Within Functional Limits for tasks assessed                                        Exercises Total Joint Exercises Ankle Circles/Pumps: AROM;Both;10 reps;Supine Quad Sets: AROM;Left;10 reps;Supine Short Arc Quad: AROM;Left;10 reps;Supine Heel Slides: AROM;Left;10 reps;Supine Hip ABduction/ADduction: AROM;Left;10 reps;Supine Long Arc Quad: AROM;Left;10 reps;Seated    General Comments        Pertinent Vitals/Pain Pain Assessment: 0-10 Pain Score: 7  Pain Location: L hip with mobility Pain Descriptors / Indicators: Tightness;Discomfort;Burning Pain Intervention(s): Monitored during session;Repositioned;Ice applied;RN gave pain meds during session;Patient requesting pain meds-RN notified    Home  Living                      Prior Function            PT Goals (current goals can now be found in the care plan section) Acute Rehab PT Goals Patient Stated Goal: to walk alone again Potential to Achieve Goals: Good Progress towards PT goals: Progressing toward goals    Frequency    7X/week      PT Plan Current plan remains appropriate    Co-evaluation               AM-PAC PT "6 Clicks" Daily Activity  Outcome Measure  Difficulty turning over in bed (including adjusting bedclothes, sheets and blankets)?: None Difficulty moving from lying on back to sitting on the side of the bed? : None Difficulty sitting down on and standing up from a chair with arms (e.g., wheelchair, bedside commode, etc,.)?: None Help needed moving to and from a bed to chair (including a wheelchair)?: A Little Help needed walking in hospital room?: A Little Help needed climbing 3-5 steps with a railing? : A Little 6 Click Score: 21    End of Session Equipment Utilized During Treatment: Gait belt Activity Tolerance: Patient tolerated treatment well;Patient limited by pain Patient left: in bed;with call bell/phone within reach Nurse Communication: Mobility status PT Visit Diagnosis: Other abnormalities of gait and mobility (R26.89)     Time: 8469-6295 PT Time Calculation (min) (ACUTE ONLY): 27 min  Charges:  $Gait Training: 8-22 mins $Therapeutic Exercise: 8-22 mins                    G Codes:       Governor Rooks, PTA pager [phone]    Cristela Blue 04/06/2017, 9:39 AM

## 2017-04-06 NOTE — Clinical Social Work Placement (Signed)
   CLINICAL SOCIAL WORK PLACEMENT  NOTE  Date:  04/06/2017  Patient Details  Name: Dorothy White MRN: [id number] Date of Birth: 05/13/1951  Clinical Social Work is seeking post-discharge placement for this patient at the [place] level of care (*CSW will initial, date and re-position this form in  chart as items are completed):  Yes   Patient/family provided with [place] Work Department's list of facilities offering this level of care within the geographic area requested by the patient (or if unable, by the patient's family).  Yes   Patient/family informed of their freedom to choose among providers that offer the needed level of care, that participate in Medicare, Medicaid or managed care program needed by the patient, have an available bed and are willing to accept the patient.  Yes   Patient/family informed of [place]'s ownership interest in [facility] and [facility], as well as of the fact that they are under no obligation to receive care at these facilities.  PASRR submitted to EDS on       PASRR number received on 04/06/17     Existing PASRR number confirmed on       FL2 transmitted to all facilities in geographic area requested by pt/family on 04/03/17     FL2 transmitted to all facilities within larger geographic area on       Patient informed that his/her managed care company has contracts with or will negotiate with certain facilities, including the following:        Yes   Patient/family informed of bed offers received.  [facility] at [facility] and patient chooses bed at      Patient to be transferred to [facility] on 04/06/17.  Patient to be transferred to facility by [facility]     Patient family notified on 04/06/17 of transfer.  Name of family member notified:  family at bedside     PHYSICIAN Please prepare priority discharge summary, including  medications, Please prepare prescriptions, Please sign FL2     Additional Comment:    _______________________________________________ Normajean Baxter, LCSW 04/06/2017, 11:06 AM

## 2017-04-06 NOTE — Social Work (Signed)
Clinical Social Worker facilitated patient discharge including contacting patient family and facility to confirm patient discharge plans.  Clinical information faxed to facility and family agreeable with plan.    CSW arranged ambulance transport via [facility] to [facility].    RN to call [phone] to give report prior to discharge.  Clinical Social Worker will sign off for now as social work intervention is no longer needed. Please consult [place] again if new need arises.  Elissa Hefty, LCSW Clinical Social Worker [phone]

## 2017-04-06 NOTE — Discharge Summary (Addendum)
Physician Discharge Summary  Dorothy White:[id number] DOB: 27-Feb-1951 DOA: 03/31/2017  PCP: Maurice Small, MD  Admit date: 03/31/2017 Discharge date: 04/06/2017  Admitted From: Home Disposition: SNF   Recommendations for Outpatient Follow-up:  1. Follow up with PCP in 1-2 weeks after SNF DC 2. Follow up with Dr. Erlinda Hong, orthopedics 3. Check BMP and CBC in 1 week  Home Health: N/A Equipment/Devices: Per SNF Discharge Condition: Stable CODE STATUS: Full Diet recommendation: Regular  Brief/Interim Summary: Dorothy White is a 65 y.o. female with a history of depression, anxiety, migraine, moderate malnutrition, TIA, carotid artery stenosis and frequent falls who presented with another fall, having slipped in the kitchen, striking head and landing on left side with immediate left hip pain. Fall work up during recent admission in July 2018 with echo, MRI, and carotid imaging was unremarkable. On arrival she had a scalp laceration not requiring repair and XR revealed left hip fracture. She was admitted and underwent left THA with Dr. Erlinda Hong 9/5. Post operative course was complicated by acute delirium, since resolved, and operative hematoma with anemia which is stable/improving, having received 1u PRBCs 9/8 with improvement of hemoglobin to 9.3.   Discharge Diagnoses:  Principal Problem:   Closed fracture of neck of left femur (HCC) Active Problems:   Anxiety and depression   Scalp contusion   Fall at home, initial encounter   Osteoporosis   Acute blood loss as cause of postoperative anemia   Acute delirium  Closed left hip fracture: s/p left THA 9/5 with Dr. Erlinda Hong. Fragility fracture in patient with osteoporosis. Vit D normal at 35.5. - WBAT - Needs dressing removed on POD #7, then staple removal on POD #10-14 at follow up visit with Dr. Erlinda Hong (needs to be scheduled). Apply betadine to incision twice daily after dressing removal.  - Lovenox as prescribed for DVT ppx x14 days, then restart  aspirin 81mg  daily. - Percocet as prescribed per orthopedics prn pain, also needs senna daily when taking percocet.  Acute blood loss anemia on pernicious anemia: Post-op, as expected. Baseline hemoglobin is normal >12mg /dl.  - Monitor CBC weekly or prn bleeding. Ecchymosis resolving. Hgb 9.3 s/p 1u PRBCs on 9/8.   Frequent falls: Patient with history of recurrent fall and admitted in July this year. Patient had MRI/MRA of brain which was unremarkable. Patient is known to have left internal carotid artery stenosis. Echocardiogram with normal systolic function, cardiac cath 2017 showed mild LAD lesion. No prodromal symptoms/LOC. Had negative CT head at admission - Discharge to SNF per PT/OT recommendations.   Acute kidney injury: SCr elevated modestly at admission to 1.03, though this represents a significant elevation from baseline, as evidenced by improvement thus far to 0.64.  - Monitor in 1 week.  QT prolongation: Resolved, QTc 428msec 9/5.  - Monitor, not on suboxone.   History of substance abuse:  - Pt no longer on suboxone. - UDS +amphetamines (no known prescriptions would cause this) and benzodiazepines (Had Rx klonopin).  Acute delirium: Related to pain, narcotics. Resolved. Negative head CT at admission, nonfocal exam. Recent TSH wnl.  - Minimize sedating medications as able.  - Monitor for other stigmata of withdrawal, though not typical of any specific agent.  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 Recommended to continue to follow up.  - Continue vitamin B12  History of migraine: Seen by neurologist in the past, has hemiplegic symptoms with these.  - Continue topamax.  Moderate  protein calorie malnutrition:  - Nutrition consulted - Continue feeding supplement.  Discharge Instructions Discharge Instructions    Diet general    Complete by:  As directed    Increase activity slowly     Complete by:  As directed    Weight bearing as tolerated    Complete by:  As directed      Allergies as of 04/06/2017      Reactions   Penicillins Hives   Has patient had a PCN reaction causing immediate rash, facial/tongue/throat swelling, SOB or lightheadedness with hypotension: Yes Has patient had a PCN reaction causing severe rash involving mucus membranes or skin necrosis: No Has patient had a PCN reaction that required hospitalization No Has patient had a PCN reaction occurring within the last 10 years: No If all of the above answers are "NO", then may proceed with Cephalosporin use.      Medication List    STOP taking these medications   aspirin EC 81 MG tablet   SUBOXONE 2-0.5 MG Film Generic drug:  Buprenorphine HCl-Naloxone HCl     TAKE these medications   clonazePAM 1 MG tablet Commonly known as:  KLONOPIN Take 1 tablet (1 mg total) by mouth daily as needed (anxiety/sleep.).   diclofenac sodium 1 % Gel Commonly known as:  VOLTAREN Apply 1 application topically daily as needed for pain.   enoxaparin 40 MG/0.4ML injection Commonly known as:  LOVENOX Inject 0.4 mLs (40 mg total) into the skin daily.   feeding supplement (ENSURE ENLIVE) Liqd Take 237 mLs by mouth 2 (two) times daily between meals.   FLUoxetine 20 MG capsule Commonly known as:  PROZAC Take 2 capsules (40 mg total) by mouth daily.   LUTEIN PO Take 1 capsule by mouth 2 (two) times daily.   meloxicam 15 MG tablet Commonly known as:  MOBIC Take 15 mg by mouth daily as needed for pain.   mometasone 50 MCG/ACT nasal spray Commonly known as:  NASONEX Place 2 sprays into the nose daily.   ondansetron 8 MG tablet Commonly known as:  ZOFRAN Take 8 mg by mouth every 8 (eight) hours as needed for nausea or vomiting.   oxyCODONE-acetaminophen 10-325 MG tablet Commonly known as:  PERCOCET Take 1 tablet by mouth 3 (three) times daily as needed for pain. What changed:  Another medication with the  same name was added. Make sure you understand how and when to take each.   oxyCODONE-acetaminophen 5-325 MG tablet Commonly known as:  PERCOCET Take 1-2 tablets by mouth every 4 (four) hours as needed for severe pain. What changed:  You were already taking a medication with the same name, and this prescription was added. Make sure you understand how and when to take each.   RELPAX 40 MG tablet Generic drug:  eletriptan Take 40 mg by mouth every 2 (two) hours as needed. Take 1 tablet by mouth as needed for migraine and may take 1 additional tablet in 2 hours if no relief   senna 8.6 MG Tabs tablet Commonly known as:  SENOKOT Take 1 tablet (8.6 mg total) by mouth daily. only while taking perococet   topiramate 25 MG capsule Commonly known as:  TOPAMAX Take 1 capsule (25 mg total) by mouth at bedtime. Take for 1 wk.  Increase to 50 mg QHS on week 2, 75 mg QHS on week 3, 100 mg QHS on week 4 and onward What changed:  how much to take  when to take this  additional  instructions   trazodone 300 MG tablet Commonly known as:  DESYREL Take 1 tablet (300 mg total) by mouth at bedtime as needed for sleep. What changed:  medication strength  how much to take   vitamin B-12 1000 MCG tablet Commonly known as:  CYANOCOBALAMIN Take 1 tablet (1,000 mcg total) by mouth daily.   vitamin C 100 MG tablet Take 100 mg by mouth daily.   vitamin E 100 UNIT capsule Take 100 Units by mouth every evening.            Discharge Care Instructions        Start     Ordered   04/07/17 0000  senna (SENOKOT) 8.6 MG TABS tablet  Daily     04/06/17 1111   04/06/17 0000  traZODone (DESYREL) 300 MG tablet  At bedtime PRN     04/06/17 1111   04/06/17 0000  Increase activity slowly     04/06/17 1111   04/06/17 0000  Diet general     04/06/17 1111   04/06/17 0000  clonazePAM (KLONOPIN) 1 MG tablet  Daily PRN     04/06/17 1128   04/01/17 0000  enoxaparin (LOVENOX) 40 MG/0.4ML injection  Daily      04/01/17 1241   04/01/17 0000  oxyCODONE-acetaminophen (PERCOCET) 5-325 MG tablet  Every 4 hours PRN     04/01/17 1241   04/01/17 0000  Weight bearing as tolerated     04/01/17 1241      Contact information for follow-up providers    Leandrew Koyanagi, MD Follow up in 2 week(s).   Specialty:  Orthopedic Surgery Why:  For suture removal, For wound re-check Contact information: [address] [place] [address] [phone]        Maurice Small, MD Follow up.   Specialty:  Family Medicine Why:  after SNF DC Contact information: [address] [place] [address] [phone]            Contact information for after-discharge care    Destination    [facility] Follow up.   Specialty:  [place] information: [address] [place] [address] [phone]                 Allergies  Allergen Reactions  . Penicillins Hives    Has patient had a PCN reaction causing immediate rash, facial/tongue/throat swelling, SOB or lightheadedness with hypotension: Yes Has patient had a PCN reaction causing severe rash involving mucus membranes or skin necrosis: No Has patient had a PCN reaction that required hospitalization No Has patient had a PCN reaction occurring within the last 10 years: No If all of the above answers are "NO", then may proceed with Cephalosporin use.     Consultations:  Orthopedics, [place]  Procedures/Studies: Ct Head Wo Contrast  Result Date: 03/31/2017 CLINICAL DATA:  Fall. Headache. Neck pain. Imbalance. Initial encounter. EXAM: CT HEAD WITHOUT CONTRAST CT CERVICAL SPINE WITHOUT CONTRAST TECHNIQUE: Multidetector CT imaging of the head and cervical spine was performed following the standard protocol without intravenous contrast. Multiplanar CT image reconstructions of the cervical spine were also generated. COMPARISON:  Brain MRI 02/17/2017. Head and  cervical spine CT 02/17/2017. FINDINGS: CT HEAD FINDINGS Brain: There is no evidence of acute infarct, intracranial hemorrhage, mass, midline shift, or extra-axial fluid collection. Mild generalized cerebral atrophy is within normal limits for age. Vascular: Calcified atherosclerosis at the skullbase. No hyperdense vessel. Skull: No fracture  or focal osseous lesion. Sinuses/Orbits: Paranasal sinuses and mastoid air cells are clear. Unremarkable orbits. Other: Small left parietal scalp hematoma. CT CERVICAL SPINE FINDINGS Alignment: Mild cervical spine straightening. Unchanged trace retrolisthesis of C6 on C7, likely degenerative. Skull base and vertebrae: No fracture destructive osseous process. Soft tissues and spinal canal: No prevertebral swelling. Disc levels: Mild lower cervical disc degeneration, greatest at C6-7 and unchanged from the prior examination. Moderate left facet arthrosis at C7-T1. Upper chest: Unremarkable. Other: None. IMPRESSION: 1. No evidence of acute intracranial abnormality. 2. Left parietal scalp hematoma. 3. No evidence of fracture or traumatic subluxation in the cervical spine. Electronically Signed   By: Logan Bores M.D.   On: 03/31/2017 16:08   Ct Cervical Spine Wo Contrast  Result Date: 03/31/2017 CLINICAL DATA:  Fall. Headache. Neck pain. Imbalance. Initial encounter. EXAM: CT HEAD WITHOUT CONTRAST CT CERVICAL SPINE WITHOUT CONTRAST TECHNIQUE: Multidetector CT imaging of the head and cervical spine was performed following the standard protocol without intravenous contrast. Multiplanar CT image reconstructions of the cervical spine were also generated. COMPARISON:  Brain MRI 02/17/2017. Head and cervical spine CT 02/17/2017. FINDINGS: CT HEAD FINDINGS Brain: There is no evidence of acute infarct, intracranial hemorrhage, mass, midline shift, or extra-axial fluid collection. Mild generalized cerebral atrophy is within normal limits for age. Vascular: Calcified atherosclerosis at the  skullbase. No hyperdense vessel. Skull: No fracture or focal osseous lesion. Sinuses/Orbits: Paranasal sinuses and mastoid air cells are clear. Unremarkable orbits. Other: Small left parietal scalp hematoma. CT CERVICAL SPINE FINDINGS Alignment: Mild cervical spine straightening. Unchanged trace retrolisthesis of C6 on C7, likely degenerative. Skull base and vertebrae: No fracture destructive osseous process. Soft tissues and spinal canal: No prevertebral swelling. Disc levels: Mild lower cervical disc degeneration, greatest at C6-7 and unchanged from the prior examination. Moderate left facet arthrosis at C7-T1. Upper chest: Unremarkable. Other: None. IMPRESSION: 1. No evidence of acute intracranial abnormality. 2. Left parietal scalp hematoma. 3. No evidence of fracture or traumatic subluxation in the cervical spine. Electronically Signed   By: Logan Bores M.D.   On: 03/31/2017 16:08   Dg Pelvis Portable  Result Date: 04/01/2017 CLINICAL DATA:  Status post left total hip joint prosthesis placement. EXAM: PORTABLE PELVIS 1-2 VIEWS COMPARISON:  Fluoro spot radiographs from the intraoperative procedure earlier today. FINDINGS: The patient has undergone left total hip joint prosthesis placement. Radiographic positioning of the prosthetic components is good. The interface with the native bone appears normal. No acute native bone abnormality is observed. IMPRESSION: There is no postprocedure complication following left total hip joint prosthesis placement. Electronically Signed   By: David  Martinique M.D.   On: 04/01/2017 16:29   Chest Portable 1 View  Result Date: 03/31/2017 CLINICAL DATA:  Pain following fall EXAM: PORTABLE CHEST 1 VIEW COMPARISON:  February 17, 2017 FINDINGS: There is no edema or consolidation. There is scarring in the medial left base. Heart size and pulmonary vascularity are normal. No adenopathy. No pneumothorax. No acute fracture evident. There are breast implants bilaterally. IMPRESSION:  Scarring in medial left base. No edema or consolidation. Stable cardiac silhouette. No pneumothorax evident. Electronically Signed   By: Lowella Grip III M.D.   On: 03/31/2017 18:12   Dg C-arm 1-60 Min  Result Date: 04/01/2017 CLINICAL DATA:  66 year old female undergoing left direct anterior total hip replacement. Left femoral neck fracture yesterday after a fall. EXAM: OPERATIVE LEFT HIP (WITH PELVIS IF PERFORMED) 2 VIEWS TECHNIQUE: Fluoroscopic spot image(s) were submitted for interpretation post-operatively. COMPARISON:  Preoperative left hip series 9418. FINDINGS: 2 intraoperative fluoroscopic spot views of the left hip and lower pelvis. Left total hip arthroplasty hardware in place and appears normally aligned in the AP view. FLUOROSCOPY TIME:  0 minutes 48 seconds IMPRESSION: Intraoperative images of Left total hip arthroplasty, no adverse features. Electronically Signed   By: Genevie Ann M.D.   On: 04/01/2017 16:02   Dg Hip Operative Unilat W Or W/o Pelvis Left  Result Date: 04/01/2017 CLINICAL DATA:  66 year old female undergoing left direct anterior total hip replacement. Left femoral neck fracture yesterday after a fall. EXAM: OPERATIVE LEFT HIP (WITH PELVIS IF PERFORMED) 2 VIEWS TECHNIQUE: Fluoroscopic spot image(s) were submitted for interpretation post-operatively. COMPARISON:  Preoperative left hip series 9418. FINDINGS: 2 intraoperative fluoroscopic spot views of the left hip and lower pelvis. Left total hip arthroplasty hardware in place and appears normally aligned in the AP view. FLUOROSCOPY TIME:  0 minutes 48 seconds IMPRESSION: Intraoperative images of Left total hip arthroplasty, no adverse features. Electronically Signed   By: Genevie Ann M.D.   On: 04/01/2017 16:02   Dg Hip Unilat With Pelvis 2-3 Views Left  Result Date: 03/31/2017 CLINICAL DATA:  Left hip pain after fall. EXAM: DG HIP (WITH OR WITHOUT PELVIS) 2-3V LEFT COMPARISON:  None. FINDINGS: There is an impacted subcapital  fracture of the left femoral neck with mild foreshortening. The bones are osteopenic. Bowel sutures are noted in the pelvis. IMPRESSION: Impacted, subcapital fracture of the left femoral neck. Electronically Signed   By: Titus Dubin M.D.   On: 03/31/2017 15:36   Subjective: No complaints, pain controlled. Ambulating with assistance. Bruising is stable in left leg  Discharge Exam: Vitals:   04/05/17 2117 04/06/17 0434  BP: (!) 108/58 (!) 117/46  Pulse: 75 68  Resp: 16 16  Temp: 98.1 F (36.7 C) 99.2 F (37.3 C)  SpO2: 97% 97%   General: Pt is alert, awake, not in acute distress Cardiovascular: RRR, S1/S2 +, no rubs, no gallops Respiratory: CTA bilaterally, no wheezing, no rhonchi Abdominal: Soft, NT, ND, bowel sounds + Extremities: Warm, no deformities. Left thigh compartment soft, strength 5/5, SILT, brisk cap refill.  Skin: Left hip dressing c/d/i. No bleeding. Posterolateral thigh with healing ecchymosis, no expansion from previous exams.   Labs: BNP (last 3 results)  Recent Labs  09/26/16 1924  BNP 8.9   Basic Metabolic Panel:  Recent Labs Lab 03/31/17 1511 04/01/17 1640 04/02/17 0402 04/03/17 0422  NA 140  --  140 138  K 4.0  --  3.6 3.6  CL 107  --  112* 111  CO2 24  --  22 21*  GLUCOSE 97  --  115* 102*  BUN 14  --  9 8  CREATININE 1.03* 0.79 0.65 0.64  CALCIUM 9.4  --  8.2* 8.2*   Liver Function Tests:  Recent Labs Lab 03/31/17 1835  AST 15  ALT 15  ALKPHOS 94  BILITOT 0.8  PROT 6.8  ALBUMIN 3.9   CBC:  Recent Labs Lab 03/31/17 1511 04/01/17 1640 04/02/17 0402 04/03/17 0422 04/04/17 0336 04/05/17 0336  WBC 9.7 8.0 9.8 9.6 6.3 5.4  NEUTROABS 8.6*  --   --   --   --   --   HGB 14.0 11.4* 9.2* 8.4* 7.9* 9.3*  HCT 43.7 35.6* 28.3* 26.2* 24.6* 27.8*  MCV 87.1 88.1 86.5 86.8 85.4 85.3  PLT 205 180 192 158 160 190   Urinalysis    Component Value Date/Time  COLORURINE YELLOW 03/31/2017 1801   APPEARANCEUR HAZY (A) 03/31/2017 1801    LABSPEC 1.013 03/31/2017 1801   PHURINE 7.0 03/31/2017 1801   GLUCOSEU NEGATIVE 03/31/2017 1801   GLUCOSEU NEGATIVE 04/13/2007 1417   HGBUR NEGATIVE 03/31/2017 1801   BILIRUBINUR NEGATIVE 03/31/2017 1801   KETONESUR NEGATIVE 03/31/2017 1801   PROTEINUR NEGATIVE 03/31/2017 1801   UROBILINOGEN 0.2 02/04/2015 1800   NITRITE NEGATIVE 03/31/2017 1801   LEUKOCYTESUR NEGATIVE 03/31/2017 1801    Time coordinating discharge: Approximately 40 minutes  Vance Gather, MD  [facility] 04/06/2017, 11:28 AM Pager [phone]

## 2017-04-06 NOTE — Progress Notes (Signed)
RN attempted to call pt's brother about the two rings and bracelet. No answer. No voicemail for RN to leave a message. [facility] states they are transporting pt to SNF since security does not have the pt's belongings. Pt is not in distress.

## 2017-04-07 ENCOUNTER — Encounter: Payer: PPO | Admitting: Physical Therapy

## 2017-04-07 ENCOUNTER — Encounter: Payer: Self-pay | Admitting: Internal Medicine

## 2017-04-07 ENCOUNTER — Non-Acute Institutional Stay (SKILLED_NURSING_FACILITY): Payer: PPO | Admitting: Internal Medicine

## 2017-04-07 DIAGNOSIS — Z9189 Other specified personal risk factors, not elsewhere classified: Secondary | ICD-10-CM | POA: Diagnosis not present

## 2017-04-07 DIAGNOSIS — W19XXXS Unspecified fall, sequela: Secondary | ICD-10-CM | POA: Diagnosis not present

## 2017-04-07 DIAGNOSIS — G43419 Hemiplegic migraine, intractable, without status migrainosus: Secondary | ICD-10-CM

## 2017-04-07 DIAGNOSIS — S72002D Fracture of unspecified part of neck of left femur, subsequent encounter for closed fracture with routine healing: Secondary | ICD-10-CM | POA: Diagnosis not present

## 2017-04-07 NOTE — Assessment & Plan Note (Addendum)
Follow-up with Dr. Jannifer Franklin, Neurologist as falls are recurrent and place her at extreme risk long-term.  Differential diagnosis : falls due to hemiplegic migraine vs possible cardiac dysrrhythmia in context of non ischemic cardiomyopathy & hx PVCs F/U with Dr Erlinda Hong, [facility]

## 2017-04-07 NOTE — Progress Notes (Signed)
NURSING HOME LOCATION:  [facility] ROOM NUMBER: : [id number]  CODE STATUS:  DNR  PCP:  Maurice Small, MD  [address] [place] [address]   This is a comprehensive admission note to [facility] performed on this date less than 30 days from date of admission. Included are preadmission medical/surgical history;reconciled medication list; family history; social history and comprehensive review of systems.  Corrections and additions to the records were documented . Comprehensive physical exam was also performed. Additionally a clinical summary was entered for each active diagnosis pertinent to this admission in the Problem List to enhance continuity of care.  HPI:  The patient sustained a subcapital fracture of the left hip for which anterior total hip replacement was performed by Dr. Erlinda Hong on 04/01/17. The patient stated that she slipped in the kitchen striking her head and left hip. There was no loss of consciousness or other neuromuscular symptoms. This was in the context of weakness in her legs present for an extended period of time. The patient did have a scalp laceration which did not require suturing. She later told the NP Student that she turned and fell when her legs seem to go out from under her while standing in the kitchen. Limb weakness did not develop until  after  she was on the floor. She is now  unsure whether she lost consciousness with this episode.  The patient had been admitted 02/17/17 after falling. This was one of multiple episodes of falls. These were precipitated by dizziness associated with weakness in her legs. She also has a history of TIA. No acute CT findings were present at that time. MRI/MRA revealed no acute intracranial process. She did receive out patient rehabilitation in August for recurrent falls She saw here Neurologist Dr. Jannifer Franklin 02/26/17 for migraine headaches present since age 5. 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 S he also has episodes of feeling dizzy with associated weakness of the lower extremities and difficulty walking. She has also had intermittent migratory numbness of the extremities. Associated have been photophobia and phonophobia and occasional nausea with headaches. She described chronic issues with imbalance while ambulating. She noted migraines approximately 2 times a week with hemiplegic type of headaches less frequent. Dr Jannifer Franklin noted that the patient did not take Topamax prescribed when she was discharged in July. The patient had recently stop smoking. The patient had been taking Relpax for headaches but Dr. Jannifer Franklin noted this was contraindicated with hemiplegic migraines. Low-dose aspirin was continued. Despite his recommendation she has continued to use the Relpax for the nonhematologic migraines. She states she has been taking the Topamax. She states that she has falling episodes approximately 3 times a year. 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 This is mainly manifested as dizziness and limb weakness.   Past medical and surgical history: Includes "mini stroke" in 2014. It is unclear whether this was possible hemiplegic migraine. She has nonischemic cardiomyopathy with an ejection fraction of 40-45 percent. She's has mild LAD disease with less than 25% blockage. Other diagnoses include depression, diverticulosis complicated by diverticulitis, chronic back pain, and anxiety. 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 Surgeries include hysterectomy bilateral oophorectomy, laparoscopic right colectomy , rhinoplasty and augmentation mammoplasty  Social history: She used coccaine in the 1980s ;she underwent rehabilitation at [facility].  She states that she never smoked more than 3-4 cigarettes per day. She did quit smoking recently.  Family  history: Reviewed  Review of systems: She initially had difficulty recalling who Dr. Jannifer Franklin was. She thought he might have been one of the physicians at the hospital. Also she did not realize Dr Erlinda Hong had performed her hip surgery. As noted the HPI was somewhat vague and variable.Otherwise she was oriented X3. She states she is having pain in the left lower extremity at the site of bruising over the posterior lateral thigh.  Denied were any change in heart rhythm or rate prior to the fall & hip fracture event. There was no associated chest pain or shortness of breath . Also specifically denied PRIOR to the episode were headache, limb weakness, tingling, or numbness. No seizure activity noted.  Constitutional: No fever,significant weight change, fatigue  Eyes: No redness, discharge, pain, vision change ENT/mouth: No nasal congestion,  purulent discharge, earache,change in hearing ,sore throat  Cardiovascular: No chest pain, palpitations,paroxysmal nocturnal dyspnea, claudication, edema  Respiratory: No cough, sputum production,hemoptysis, DOE , significant snoring,apnea  Gastrointestinal: No heartburn,dysphagia,abdominal pain, nausea / vomiting,rectal bleeding, melena,change in bowels Genitourinary: No dysuria,hematuria, pyuria,  incontinence, nocturia Dermatologic: No rash, pruritus, change in appearance of skin Endocrine: No change in hair/skin/ nails, excessive thirst, excessive hunger, excessive urination  Hematologic/lymphatic: No significant bruising, lymphadenopathy,abnormal bleeding Allergy/immunology: No itchy/ watery eyes, significant sneezing, urticaria, angioedema  Physical exam:  Pertinent or positive findings: she appears to exhibit some component of "[place] belle in difference" when the risk of potential health or life-threatening injury related falls was discussed and again when the risk of addiction or adverse reaction to opioids if continued long-term was discussed.  She appears thin  but adequately nourished. Dark roots are visible in the midline of the scalp. Thinning of eyebrows laterally. Faint bruise over the right frontal area. Splitting of the first heart sound. Breath sounds are decreased. She exhibits some weakness of right upper extremity which is chronic. There is  bruising over the left lower extremity posterolaterally.   General appearance: no acute distress , increased work of breathing is present.   Lymphatic: No lymphadenopathy about the head, neck, axilla . Eyes: No conjunctival inflammation or lid edema is present. There is no scleral icterus. Ears:  External ear exam shows no significant lesions or deformities.   Nose:  External nasal examination shows no deformity or inflammation. Nasal mucosa are pink and moist without lesions ,exudates Oral exam: lips and gums are healthy appearing.There is no oropharyngeal erythema or exudate . Neck:  No thyromegaly, masses, tenderness noted.    Heart:  Normal rate and regular rhythm.  S2 normal without gallop, murmur, click, rub .  Lungs: without wheezes, rhonchi,rales , rubs. Abdomen:Bowel sounds are normal. Abdomen is soft and nontender with no organomegaly, hernias,masses. GU: deferred  Extremities:  No cyanosis, clubbing,edema  Neurologic exam : Balance,Rhomberg,finger to nose testing could not be completed due to clinical state Deep tendon reflexes are equal Skin: Warm & dry w/o tenting. No significant lesions or rash.  See clinical summary under each active problem in the Problem List with associated updated therapeutic plan

## 2017-04-07 NOTE — Assessment & Plan Note (Addendum)
I explained  risk of over 80 % of adverse reaction or possibly death if opioids are continued long-term. Patient was instructed the dose should be weaned & D/Ced ASAP due to this risk.

## 2017-04-07 NOTE — Assessment & Plan Note (Addendum)
PT/OT at SNF It is critical that the etiology of the falls be identified and corrected if all possible The most recent episode was not associated with hemiplegic migraine or cardiac/neurologic prodrome. She may be a candidate for an event monitor to rule out rhythm dysfunction as a component especially in context of non ischemic cardiomyopathy diagnosis & hx PVCs in Problem List.

## 2017-04-07 NOTE — Assessment & Plan Note (Signed)
A follow-up with Dr. Jannifer Franklin, neurologists as soon as possible.

## 2017-04-08 ENCOUNTER — Telehealth: Payer: Self-pay | Admitting: Neurology

## 2017-04-08 NOTE — Telephone Encounter (Signed)
The patient is in an extended care facility with a history multiple falls. She has been seen through this office for migraine headache. MRI of the brain done recently has been relatively unremarkable.  The etiology of the gait disturbance is not clear, the patient will follow-up in this office in early November, we will need to address this issue.

## 2017-04-08 NOTE — Patient Instructions (Signed)
See assessment and plan under each diagnosis in the problem list and acutely for this visit 

## 2017-04-09 ENCOUNTER — Encounter: Payer: PPO | Admitting: Physical Therapy

## 2017-04-13 LAB — CBC AND DIFFERENTIAL
HEMATOCRIT: 35 — AB (ref 36–46)
HEMOGLOBIN: 11 — AB (ref 12.0–16.0)
NEUTROS ABS: 4
Platelets: 470 — AB (ref 150–399)
WBC: 5.7

## 2017-04-13 LAB — BASIC METABOLIC PANEL
BUN: 11 (ref 4–21)
Creatinine: 0.7 (ref 0.5–1.1)
Glucose: 93
Potassium: 4 (ref 3.4–5.3)
SODIUM: 147 (ref 137–147)

## 2017-04-15 ENCOUNTER — Telehealth (INDEPENDENT_AMBULATORY_CARE_PROVIDER_SITE_OTHER): Payer: Self-pay

## 2017-04-15 NOTE — Telephone Encounter (Signed)
See message below °

## 2017-04-15 NOTE — Telephone Encounter (Signed)
I cannot adjust pain meds while she is at rehab center.  The supervising doctor at the facility is the only one who can do that.

## 2017-04-15 NOTE — Telephone Encounter (Signed)
Patient would like to know if Dr. Erlinda Hong can adjust her pain medication while she is at [facility] rehab.  Stated that the Oxycodone is not lasting for 12 hours.  CB# is [phone].  Please advise.  Thank you.

## 2017-04-16 ENCOUNTER — Encounter: Payer: Self-pay | Admitting: Internal Medicine

## 2017-04-16 DIAGNOSIS — Z96641 Presence of right artificial hip joint: Secondary | ICD-10-CM | POA: Insufficient documentation

## 2017-04-16 DIAGNOSIS — R296 Repeated falls: Secondary | ICD-10-CM | POA: Insufficient documentation

## 2017-04-16 NOTE — Telephone Encounter (Signed)
Called pt to advise

## 2017-04-20 ENCOUNTER — Ambulatory Visit (INDEPENDENT_AMBULATORY_CARE_PROVIDER_SITE_OTHER): Payer: PPO | Admitting: Orthopaedic Surgery

## 2017-04-20 DIAGNOSIS — S72002D Fracture of unspecified part of neck of left femur, subsequent encounter for closed fracture with routine healing: Secondary | ICD-10-CM

## 2017-04-20 NOTE — Progress Notes (Signed)
Patient is 19 days status post left total hip replacement for femoral neck fracture. She will be discharged from skilled nursing facility 4 days. She overall complains of mild to moderate pain. Pain is mainly in her hip and thigh with activity.  Surgical scar is healed without signs of infection. She does have bruising and ecchymosis in the thigh region. The compartments are soft. Her left leg is slightly longer than her right leg.  I recommend that she continue to work with physical therapy. I did tell her that while she is under the care at her skilled nursing facility I cannot prescribe any pain medicines. I'm happy to prescribe pain medicines as indicated when she is discharged from there. I'll see her back in 4 weeks with repeat AP pelvis.

## 2017-04-22 ENCOUNTER — Encounter: Payer: Self-pay | Admitting: Adult Health

## 2017-04-22 ENCOUNTER — Non-Acute Institutional Stay (SKILLED_NURSING_FACILITY): Payer: PPO | Admitting: Adult Health

## 2017-04-22 DIAGNOSIS — R2681 Unsteadiness on feet: Secondary | ICD-10-CM | POA: Diagnosis not present

## 2017-04-22 DIAGNOSIS — S72002D Fracture of unspecified part of neck of left femur, subsequent encounter for closed fracture with routine healing: Secondary | ICD-10-CM | POA: Diagnosis not present

## 2017-04-22 DIAGNOSIS — G43419 Hemiplegic migraine, intractable, without status migrainosus: Secondary | ICD-10-CM

## 2017-04-22 DIAGNOSIS — G629 Polyneuropathy, unspecified: Secondary | ICD-10-CM

## 2017-04-22 DIAGNOSIS — J309 Allergic rhinitis, unspecified: Secondary | ICD-10-CM

## 2017-04-22 DIAGNOSIS — F419 Anxiety disorder, unspecified: Secondary | ICD-10-CM

## 2017-04-22 DIAGNOSIS — G4700 Insomnia, unspecified: Secondary | ICD-10-CM

## 2017-04-22 DIAGNOSIS — D62 Acute posthemorrhagic anemia: Secondary | ICD-10-CM

## 2017-04-22 DIAGNOSIS — F339 Major depressive disorder, recurrent, unspecified: Secondary | ICD-10-CM | POA: Diagnosis not present

## 2017-04-22 MED ORDER — TOPIRAMATE 25 MG PO TABS: 25.0000 mg | ORAL_TABLET | Freq: Every day | ORAL | 0 refills | Status: DC

## 2017-04-22 MED ORDER — TOPIRAMATE 100 MG PO TABS: 100.0000 mg | ORAL_TABLET | Freq: Every day | ORAL | 0 refills | Status: DC

## 2017-04-22 MED ORDER — TRAZODONE HCL 300 MG PO TABS: 300.0000 mg | ORAL_TABLET | Freq: Every evening | ORAL | 0 refills | Status: DC | PRN

## 2017-04-22 MED ORDER — MOMETASONE FUROATE 50 MCG/ACT NA SUSP: 2.0000 | Freq: Every day | NASAL | 0 refills | Status: DC

## 2017-04-22 MED ORDER — RELPAX 40 MG PO TABS: 40.0000 mg | ORAL_TABLET | ORAL | 0 refills | Status: DC | PRN

## 2017-04-22 MED ORDER — FLUOXETINE HCL 20 MG PO TABS: 60.0000 mg | ORAL_TABLET | ORAL | 0 refills | Status: DC

## 2017-04-22 MED ORDER — ONDANSETRON HCL 8 MG PO TABS: 8.0000 mg | ORAL_TABLET | Freq: Three times a day (TID) | ORAL | 0 refills | Status: DC | PRN

## 2017-04-22 MED ORDER — TOPIRAMATE 50 MG PO TABS: 50.0000 mg | ORAL_TABLET | Freq: Every day | ORAL | 0 refills | Status: DC

## 2017-04-22 MED ORDER — GABAPENTIN 100 MG PO CAPS: 100.0000 mg | ORAL_CAPSULE | Freq: Every day | ORAL | 0 refills | Status: DC

## 2017-04-22 NOTE — Progress Notes (Signed)
DATE:  04/22/2017   MRN:  [id number]  BIRTHDAY: 01/09/1951  Facility:  Nursing Home Location:  [facility] Room Number: 341-P  LEVEL OF CARE:  SNF (31)  Contact Information    Name Relation Home Work Mobile   Axtell Friend   [phone]   Reyanna, Baley   [phone]       Code Status History    Date Active Date Inactive Code Status Order ID Comments User Context   03/31/2017  5:53 PM 04/06/2017  7:55 PM Full Code [id number]  Rosita Fire, MD ED   02/17/2017  8:56 PM 02/19/2017  7:09 PM Full Code [id number]  Rosita Fire, MD Inpatient   09/26/2016  8:04 PM 09/27/2016  2:24 PM Full Code [id number]  Mendel Corning, MD Inpatient   04/25/2016  1:11 PM 04/30/2016  4:07 PM Full Code [id number]  Stark Klein, MD Inpatient   03/12/2016 11:16 PM 03/19/2016  5:29 PM DNR [id number]  Karmen Bongo, MD Inpatient   02/20/2016 10:29 AM 02/21/2016  4:21 PM Full Code [id number]  Jettie Booze, MD Inpatient   02/20/2016 10:28 AM 02/20/2016 10:29 AM Full Code [id number]  Jettie Booze, MD Inpatient   12/18/2015  2:59 AM 12/21/2015  5:46 PM Full Code [id number]  Rise Patience, MD Inpatient   02/15/2015  3:29 PM 02/17/2015  3:46 PM Full Code [id number]  Netta Cedars, MD Inpatient       Chief Complaint  Patient presents with  . Discharge Note    Discharge visit    HISTORY OF PRESENT ILLNESS:  This is a 66-YO female seen for a discharge visit.  She will discharge to home on 04/23/17 with home health PT, OT, Nursing for medication management/education. She has a PMH of closed fracture of neck of left femur, anxiety/depression, osteoporosis, acute delirium, and falls. She was admitted to [facility] on 04/06/17 from [facility] with left hip fracture post fall for which she had left total hip arthroplasty on 04/01/17. She had post operative hematoma with anemia and was transfused 1 unit PRBC. She was seen in the room  today. She was seen walking out of the bathroom.  Patient was admitted to this facility for short-term rehabilitation after the patient's recent hospitalization.  Patient has completed SNF rehabilitation and therapy has cleared the patient for discharge.   PAST MEDICAL HISTORY:  Past Medical History:  Diagnosis Date  . Anemia    ?  Marland Kitchen Anxiety   . Arthritis    "everywhere" (04/25/2016)  . 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 Chronic back pain    "all over" (04/25/2016)  . Constipation   . Depression   . Diverticulitis   . GERD (gastroesophageal reflux disease)   . Headache    "weekly" (04/25/2016)  . 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 HLD (hyperlipidemia)    hx (04/25/2016)  . 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 Mild CAD    a. 25% mLAD, otherwise no sig disease 01/2016 cath.  Marland Kitchen NICM (nonischemic cardiomyopathy) ([place])    a. EF 40-45% by echo 11/352 at time of complicated migraine/neuro sx, 55-65% at time of cath 01/2016  . Stroke [facility]) 06/2013   "mini" stroke , ?possibly hemaplegic migraine per pt     CURRENT MEDICATIONS: Reviewed  Patient's Medications  New Prescriptions   No medications on file  Previous Medications  ACETAMINOPHEN (TYLENOL) 325 MG TABLET    Take 650 mg by mouth every 6 (six) hours as needed.   CLONAZEPAM (KLONOPIN) 1 MG TABLET    Take 1 mg by mouth daily as needed for anxiety.   FEEDING SUPPLEMENT, ENSURE ENLIVE, (ENSURE ENLIVE) LIQD    Take 237 mLs by mouth 2 (two) times daily between meals.   FLUOXETINE (PROZAC) 20 MG TABLET    Take 60 mg by mouth every morning. Take 3 tablets to = 60 mg qam   GABAPENTIN (NEURONTIN) 100 MG CAPSULE    Take 100 mg by mouth at bedtime.   LUTEIN PO    Take 1 capsule by mouth 2 (two) times daily.    MOMETASONE (NASONEX) 50 MCG/ACT NASAL SPRAY    Place 2 sprays into the nose daily.   ONDANSETRON (ZOFRAN) 8 MG TABLET    Take 8 mg by mouth every 8 (eight) hours as  needed for nausea or vomiting.   RELPAX 40 MG TABLET    Take 40 mg by mouth every 2 (two) hours as needed. Take 1 tablet by mouth as needed for migraine and may take 1 additional tablet in 2 hours if no relief    TOPIRAMATE (TOPAMAX) 100 MG TABLET    Take 100 mg by mouth at bedtime. Start 100 mg qhs after completion of the 75 mg dose on 04/29/17   TOPIRAMATE (TOPAMAX) 25 MG TABLET    Take 25 mg by mouth daily. Take 25 mg tablet along with a 50 mg tablet to = 75 mg qd   TOPIRAMATE (TOPAMAX) 50 MG TABLET    Take 50 mg by mouth daily. Take 50 mg tablet, along with a 25 mg tablet, to = 75 mg qd   TRAZODONE (DESYREL) 300 MG TABLET    Take 1 tablet (300 mg total) by mouth at bedtime as needed for sleep.   VITAMIN B-12 (CYANOCOBALAMIN) 1000 MCG TABLET    Take 1 tablet (1,000 mcg total) by mouth daily.   VITAMIN E 200 UNIT CAPSULE    Take 200 Units by mouth every evening.  Modified Medications   No medications on file  Discontinued Medications   CLONAZEPAM (KLONOPIN) 1 MG TABLET    Take 1 tablet (1 mg total) by mouth daily as needed (anxiety/sleep.).   ENOXAPARIN (LOVENOX) 40 MG/0.4ML INJECTION    Inject 0.4 mLs (40 mg total) into the skin daily.   FLUOXETINE (PROZAC) 40 MG CAPSULE    Take 40 mg by mouth daily.   OXYCODONE-ACETAMINOPHEN (PERCOCET) 5-325 MG TABLET    Take 1-2 tablets by mouth every 4 (four) hours as needed for severe pain.   SENNA (SENOKOT) 8.6 MG TABS TABLET    Take 1 tablet (8.6 mg total) by mouth daily. only while taking perococet   TOPIRAMATE (TOPAMAX) 25 MG CAPSULE    Take 1 capsule (25 mg total) by mouth at bedtime. Take for 1 wk.  Increase to 50 mg QHS on week 2, 75 mg QHS on week 3, 100 mg QHS on week 4 and onward     Allergies  Allergen Reactions  . Penicillins Hives    Has patient had a PCN reaction causing immediate rash, facial/tongue/throat swelling, SOB or lightheadedness with hypotension: Yes Has patient had a PCN reaction causing severe rash involving mucus membranes  or skin necrosis: No Has patient had a PCN reaction that required hospitalization No Has patient had a PCN reaction occurring within the last 10 years: No If all of  the above answers are "NO", then may proceed with Cephalosporin use.      REVIEW OF SYSTEMS:  GENERAL: no change in appetite, no fatigue, no weight changes, no fever, chills or weakness MOUTH and THROAT: Denies oral discomfort, gingival pain or bleeding, pain from teeth or hoarseness   RESPIRATORY: no cough, SOB, DOE, wheezing, hemoptysis CARDIAC: no chest pain, edema or palpitations GI: no abdominal pain, diarrhea, constipation, heart burn, nausea or vomiting GU: Denies dysuria, frequency, hematuria, incontinence, or discharge PSYCHIATRIC: Denies feeling of depression or anxiety. No report of hallucinations, insomnia, paranoia, or agitation    PHYSICAL EXAMINATION  GENERAL APPEARANCE: Well nourished. In no acute distress. Normal body habitus SKIN:  Left hip surgical incision is dry, no redness MOUTH and THROAT: Lips are without lesions. Oral mucosa is moist and without lesions. Tongue is normal in shape, size, and color and without lesions RESPIRATORY: breathing is even & unlabored, BS CTAB CARDIAC: RRR, no murmur,no extra heart sounds, no edema GI: abdomen soft, normal BS, no masses, no tenderness, no hepatomegaly, no splenomegaly EXTREMITIES:  Able to move X 4 extremities PSYCHIATRIC: Alert and oriented X 3. Affect and behavior are appropriate   LABS/RADIOLOGY: Labs reviewed: Basic Metabolic Panel:  Recent Labs  09/26/16 1924  03/31/17 1511  04/02/17 0402 04/03/17 0422 04/13/17  NA  --   < > 140  --  140 138 147  K  --   < > 4.0  --  3.6 3.6 4.0  CL  --   < > 107  --  112* 111  --   CO2  --   < > 24  --  22 21*  --   GLUCOSE  --   < > 97  --  115* 102*  --   BUN  --   < > 14  --  9 8 11   CREATININE  --   < > 1.03*  < > 0.65 0.64 0.7  CALCIUM  --   < > 9.4  --  8.2* 8.2*  --   MG 1.8  --   --   --   --    --   --   < > = values in this interval not displayed. Liver Function Tests:  Recent Labs  01/23/17 1508 02/12/17 1758 03/31/17 1835  AST 19 19 15   ALT 13* 19 15  ALKPHOS 99 99 94  BILITOT 0.7 0.7 0.8  PROT 6.6 6.8 6.8  ALBUMIN 3.8 3.8 3.9    Recent Labs  01/16/17 1920 02/12/17 1758  LIPASE 20 13   CBC:  Recent Labs  02/17/17 1610  03/31/17 1511  04/03/17 0422 04/04/17 0336 04/05/17 0336 04/13/17  WBC  --   --  9.7  < > 9.6 6.3 5.4 5.7  NEUTROABS 3.3  --  8.6*  --   --   --   --  4  HGB  --   < > 14.0  < > 8.4* 7.9* 9.3* 11.0*  HCT  --   < > 43.7  < > 26.2* 24.6* 27.8* 35*  MCV  --   --  87.1  < > 86.8 85.4 85.3  --   PLT  --   --  205  < > 158 160 190 470*  < > = values in this interval not displayed. Lipid Panel:  Recent Labs  02/18/17 1954  HDL 48   Cardiac Enzymes:  Recent Labs  09/26/16 2008 09/27/16 0210 09/27/16 0706  TROPONINI <  0.03 <0.03 <0.03   CBG:  Recent Labs  08/26/16 0128 11/26/16 1640  GLUCAP 93 85      Ct Head Wo Contrast  Result Date: 03/31/2017 CLINICAL DATA:  Fall. Headache. Neck pain. Imbalance. Initial encounter. EXAM: CT HEAD WITHOUT CONTRAST CT CERVICAL SPINE WITHOUT CONTRAST TECHNIQUE: Multidetector CT imaging of the head and cervical spine was performed following the standard protocol without intravenous contrast. Multiplanar CT image reconstructions of the cervical spine were also generated. COMPARISON:  Brain MRI 02/17/2017. Head and cervical spine CT 02/17/2017. FINDINGS: CT HEAD FINDINGS Brain: There is no evidence of acute infarct, intracranial hemorrhage, mass, midline shift, or extra-axial fluid collection. Mild generalized cerebral atrophy is within normal limits for age. Vascular: Calcified atherosclerosis at the skullbase. No hyperdense vessel. Skull: No fracture or focal osseous lesion. Sinuses/Orbits: Paranasal sinuses and mastoid air cells are clear. Unremarkable orbits. Other: Small left parietal scalp  hematoma. CT CERVICAL SPINE FINDINGS Alignment: Mild cervical spine straightening. Unchanged trace retrolisthesis of C6 on C7, likely degenerative. Skull base and vertebrae: No fracture destructive osseous process. Soft tissues and spinal canal: No prevertebral swelling. Disc levels: Mild lower cervical disc degeneration, greatest at C6-7 and unchanged from the prior examination. Moderate left facet arthrosis at C7-T1. Upper chest: Unremarkable. Other: None. IMPRESSION: 1. No evidence of acute intracranial abnormality. 2. Left parietal scalp hematoma. 3. No evidence of fracture or traumatic subluxation in the cervical spine. Electronically Signed   By: Logan Bores M.D.   On: 03/31/2017 16:08   Ct Cervical Spine Wo Contrast  Result Date: 03/31/2017 CLINICAL DATA:  Fall. Headache. Neck pain. Imbalance. Initial encounter. EXAM: CT HEAD WITHOUT CONTRAST CT CERVICAL SPINE WITHOUT CONTRAST TECHNIQUE: Multidetector CT imaging of the head and cervical spine was performed following the standard protocol without intravenous contrast. Multiplanar CT image reconstructions of the cervical spine were also generated. COMPARISON:  Brain MRI 02/17/2017. Head and cervical spine CT 02/17/2017. FINDINGS: CT HEAD FINDINGS Brain: There is no evidence of acute infarct, intracranial hemorrhage, mass, midline shift, or extra-axial fluid collection. Mild generalized cerebral atrophy is within normal limits for age. Vascular: Calcified atherosclerosis at the skullbase. No hyperdense vessel. Skull: No fracture or focal osseous lesion. Sinuses/Orbits: Paranasal sinuses and mastoid air cells are clear. Unremarkable orbits. Other: Small left parietal scalp hematoma. CT CERVICAL SPINE FINDINGS Alignment: Mild cervical spine straightening. Unchanged trace retrolisthesis of C6 on C7, likely degenerative. Skull base and vertebrae: No fracture destructive osseous process. Soft tissues and spinal canal: No prevertebral swelling. Disc levels: Mild  lower cervical disc degeneration, greatest at C6-7 and unchanged from the prior examination. Moderate left facet arthrosis at C7-T1. Upper chest: Unremarkable. Other: None. IMPRESSION: 1. No evidence of acute intracranial abnormality. 2. Left parietal scalp hematoma. 3. No evidence of fracture or traumatic subluxation in the cervical spine. Electronically Signed   By: Logan Bores M.D.   On: 03/31/2017 16:08   Dg Pelvis Portable  Result Date: 04/01/2017 CLINICAL DATA:  Status post left total hip joint prosthesis placement. EXAM: PORTABLE PELVIS 1-2 VIEWS COMPARISON:  Fluoro spot radiographs from the intraoperative procedure earlier today. FINDINGS: The patient has undergone left total hip joint prosthesis placement. Radiographic positioning of the prosthetic components is good. The interface with the native bone appears normal. No acute native bone abnormality is observed. IMPRESSION: There is no postprocedure complication following left total hip joint prosthesis placement. Electronically Signed   By: David  Martinique M.D.   On: 04/01/2017 16:29   Chest Portable 1 View  Result Date: 03/31/2017 CLINICAL DATA:  Pain following fall EXAM: PORTABLE CHEST 1 VIEW COMPARISON:  February 17, 2017 FINDINGS: There is no edema or consolidation. There is scarring in the medial left base. Heart size and pulmonary vascularity are normal. No adenopathy. No pneumothorax. No acute fracture evident. There are breast implants bilaterally. IMPRESSION: Scarring in medial left base. No edema or consolidation. Stable cardiac silhouette. No pneumothorax evident. Electronically Signed   By: Lowella Grip III M.D.   On: 03/31/2017 18:12   Dg C-arm 1-60 Min  Result Date: 04/01/2017 CLINICAL DATA:  66 year old female undergoing left direct anterior total hip replacement. Left femoral neck fracture yesterday after a fall. EXAM: OPERATIVE LEFT HIP (WITH PELVIS IF PERFORMED) 2 VIEWS TECHNIQUE: Fluoroscopic spot image(s) were submitted for  interpretation post-operatively. COMPARISON:  Preoperative left hip series 9418. FINDINGS: 2 intraoperative fluoroscopic spot views of the left hip and lower pelvis. Left total hip arthroplasty hardware in place and appears normally aligned in the AP view. FLUOROSCOPY TIME:  0 minutes 48 seconds IMPRESSION: Intraoperative images of Left total hip arthroplasty, no adverse features. Electronically Signed   By: Genevie Ann M.D.   On: 04/01/2017 16:02   Dg Hip Operative Unilat W Or W/o Pelvis Left  Result Date: 04/01/2017 CLINICAL DATA:  66 year old female undergoing left direct anterior total hip replacement. Left femoral neck fracture yesterday after a fall. EXAM: OPERATIVE LEFT HIP (WITH PELVIS IF PERFORMED) 2 VIEWS TECHNIQUE: Fluoroscopic spot image(s) were submitted for interpretation post-operatively. COMPARISON:  Preoperative left hip series 9418. FINDINGS: 2 intraoperative fluoroscopic spot views of the left hip and lower pelvis. Left total hip arthroplasty hardware in place and appears normally aligned in the AP view. FLUOROSCOPY TIME:  0 minutes 48 seconds IMPRESSION: Intraoperative images of Left total hip arthroplasty, no adverse features. Electronically Signed   By: Genevie Ann M.D.   On: 04/01/2017 16:02   Dg Hip Unilat With Pelvis 2-3 Views Left  Result Date: 03/31/2017 CLINICAL DATA:  Left hip pain after fall. EXAM: DG HIP (WITH OR WITHOUT PELVIS) 2-3V LEFT COMPARISON:  None. FINDINGS: There is an impacted subcapital fracture of the left femoral neck with mild foreshortening. The bones are osteopenic. Bowel sutures are noted in the pelvis. IMPRESSION: Impacted, subcapital fracture of the left femoral neck. Electronically Signed   By: Titus Dubin M.D.   On: 03/31/2017 15:36    ASSESSMENT/PLAN:  1. Unsteady gait -  Home health PT and OT, for therapeutic strengthening exercises, fall precuations,  2. Closed fracture of neck of left femur with routine healing, subsequent encounter - for Home health  PT and OT, for therapeutic strengthening exercises, fall precuations, follow-up with orthopedics, Dr. Erlinda Hong, will discontinue Lovenox upon discharge since she was on it since 04/01/17 and is ambulatory   3. Intractable hemiplegic migraine without status migrainosus - stable - RELPAX 40 MG tablet; Take 1 tablet (40 mg total) by mouth every 2 (two) hours as needed. Take 1 tablet by mouth as needed for migraine and may take 1 additional tablet in 2 hours if no relief  Dispense: 30 tablet; Refill: 0 - topiramate (TOPAMAX) 100 MG tablet; Take 1 tablet (100 mg total) by mouth at bedtime. Start 100 mg qhs after completion of the 75 mg dose on 04/29/17  Dispense: 30 tablet; Refill: 0 - topiramate (TOPAMAX) 50 MG tablet; Take 1 tablet (50 mg total) by mouth daily. Take 50 mg tablet, along with a 25 mg tablet, to = 75 mg qd  Dispense: 7  tablet; Refill: 0 - topiramate (TOPAMAX) 25 MG tablet; Take 1 tablet (25 mg total) by mouth daily. Take 25 mg tablet along with a 50 mg tablet to = 75 mg qd  Dispense: 7 tablet; Refill: 0  4. Anemia due to acute blood loss - S/P transfusion of 1 unit PRBC, stable Lab Results  Component Value Date   HGB 11.0 (A) 04/13/2017    5. Anxiety - mood is stable, continue Clonazepam 1 mg 1 tab Q D PRN  6. Insomnia, unspecified type - stable - trazodone (DESYREL) 300 MG tablet; Take 1 tablet (300 mg total) by mouth at bedtime as needed for sleep.  Dispense: 30 tablet; Refill: 0  7. Neuropathy - stable - gabapentin (NEURONTIN) 100 MG capsule; Take 1 capsule (100 mg total) by mouth at bedtime.  Dispense: 30 capsule; Refill: 0  8. Allergic rhinitis, unspecified seasonality, unspecified trigger - stable - mometasone (NASONEX) 50 MCG/ACT nasal spray; Place 2 sprays into the nose daily.  Dispense: 17 g; Refill: 0  9. Depression, recurrent ([place]) - mood is stable - FLUoxetine (PROZAC) 20 MG tablet; Take 3 tablets (60 mg total) by mouth every morning. Take 3 tablets to = 60 mg qam  Dispense:  90 tablet; Refill: 0      I have filled out patient's discharge paperwork and written prescriptions.  Patient will receive home health PT, OT and Nursing.  DME provided:  Shower chair  Total discharge time: Greater than 30 minutes Greater than 50% was spent in counseling and coordination of care.   Discharge time involved coordination of the discharge process with social worker, nursing staff and therapy department. Medical justification for home health services/DME verified.   Hilliard Borges C. [place] - NP    [employer] [phone]

## 2017-04-23 ENCOUNTER — Telehealth (INDEPENDENT_AMBULATORY_CARE_PROVIDER_SITE_OTHER): Payer: Self-pay | Admitting: Radiology

## 2017-04-23 NOTE — Telephone Encounter (Signed)
Norco 7.5 mg; 1-2 tabs po daily prn pain #30

## 2017-04-23 NOTE — Telephone Encounter (Signed)
See message below °

## 2017-04-23 NOTE — Telephone Encounter (Signed)
Patient has called and said that she was to let Erlinda Hong know when she was home, She is home and says she needs a pain Rx please.

## 2017-04-24 ENCOUNTER — Telehealth (INDEPENDENT_AMBULATORY_CARE_PROVIDER_SITE_OTHER): Payer: Self-pay

## 2017-04-24 MED ORDER — HYDROCODONE-ACETAMINOPHEN 7.5-325 MG PO TABS: ORAL_TABLET | ORAL | 0 refills | Status: DC

## 2017-04-24 NOTE — Telephone Encounter (Signed)
Patient calling requesting Rx for pain medication

## 2017-04-24 NOTE — Telephone Encounter (Signed)
JUST REFILLED HER RX TODAY AND CAME TO PICK UP TODAY.

## 2017-04-24 NOTE — Addendum Note (Signed)
Addended by: Precious Bard on: 04/24/2017 09:57 AM   Modules accepted: Orders

## 2017-04-24 NOTE — Telephone Encounter (Signed)
RX READY FOR PICK UP AT THE FRONT DESK, CALLED PT.

## 2017-04-25 DIAGNOSIS — I429 Cardiomyopathy, unspecified: Secondary | ICD-10-CM | POA: Diagnosis not present

## 2017-04-25 DIAGNOSIS — I6529 Occlusion and stenosis of unspecified carotid artery: Secondary | ICD-10-CM | POA: Diagnosis not present

## 2017-04-25 DIAGNOSIS — S72002D Fracture of unspecified part of neck of left femur, subsequent encounter for closed fracture with routine healing: Secondary | ICD-10-CM | POA: Diagnosis not present

## 2017-04-25 DIAGNOSIS — D649 Anemia, unspecified: Secondary | ICD-10-CM | POA: Diagnosis not present

## 2017-04-25 DIAGNOSIS — M1991 Primary osteoarthritis, unspecified site: Secondary | ICD-10-CM | POA: Diagnosis not present

## 2017-04-25 DIAGNOSIS — F419 Anxiety disorder, unspecified: Secondary | ICD-10-CM | POA: Diagnosis not present

## 2017-04-25 DIAGNOSIS — G43909 Migraine, unspecified, not intractable, without status migrainosus: Secondary | ICD-10-CM | POA: Diagnosis not present

## 2017-04-25 DIAGNOSIS — R1311 Dysphagia, oral phase: Secondary | ICD-10-CM | POA: Diagnosis not present

## 2017-04-25 DIAGNOSIS — I2510 Atherosclerotic heart disease of native coronary artery without angina pectoris: Secondary | ICD-10-CM | POA: Diagnosis not present

## 2017-04-25 DIAGNOSIS — E785 Hyperlipidemia, unspecified: Secondary | ICD-10-CM | POA: Diagnosis not present

## 2017-04-25 DIAGNOSIS — F329 Major depressive disorder, single episode, unspecified: Secondary | ICD-10-CM | POA: Diagnosis not present

## 2017-04-25 DIAGNOSIS — G4700 Insomnia, unspecified: Secondary | ICD-10-CM | POA: Diagnosis not present

## 2017-04-25 DIAGNOSIS — M810 Age-related osteoporosis without current pathological fracture: Secondary | ICD-10-CM | POA: Diagnosis not present

## 2017-04-25 DIAGNOSIS — Z96642 Presence of left artificial hip joint: Secondary | ICD-10-CM | POA: Diagnosis not present

## 2017-04-25 DIAGNOSIS — G629 Polyneuropathy, unspecified: Secondary | ICD-10-CM | POA: Diagnosis not present

## 2017-04-25 DIAGNOSIS — K219 Gastro-esophageal reflux disease without esophagitis: Secondary | ICD-10-CM | POA: Diagnosis not present

## 2017-04-25 DIAGNOSIS — J418 Mixed simple and mucopurulent chronic bronchitis: Secondary | ICD-10-CM | POA: Diagnosis not present

## 2017-04-25 DIAGNOSIS — E440 Moderate protein-calorie malnutrition: Secondary | ICD-10-CM | POA: Diagnosis not present

## 2017-04-27 ENCOUNTER — Telehealth (INDEPENDENT_AMBULATORY_CARE_PROVIDER_SITE_OTHER): Payer: Self-pay | Admitting: Orthopaedic Surgery

## 2017-04-27 ENCOUNTER — Other Ambulatory Visit: Payer: Self-pay

## 2017-04-27 NOTE — Telephone Encounter (Signed)
Gretchen from [facility] called asking for verbal orders on 3 times a week for 1 week and 2 times a week for 4 weeks. CB # [phone]

## 2017-04-27 NOTE — Telephone Encounter (Signed)
Is this okay?

## 2017-04-27 NOTE — Telephone Encounter (Signed)
yes

## 2017-04-27 NOTE — Telephone Encounter (Signed)
Estill Bamberg (nurse) with [facility] called needing verbal orders for 2 wk 1 and 1 wk 3. The number to contact Estill Bamberg is [phone]

## 2017-04-27 NOTE — Patient Outreach (Signed)
[facility]) Care Management  04/27/2017  Dorothy White 1951-03-17 [id number]     Transition of Care Referral  Referral Date: 04/27/17 Referral Source: HTA Discharge Report Date of Admission: [facility] 03/31/17 Diagnosis: fall, left hip fx Date of Discharge: transferred from [facility] to [place] on 04/06/17-04/23/17 Facility: [facility] Insurance: HTA  Outreach attempt #1 to patient. No answer. RN CM left HIPAA compliant voicemail message along with contact info.    Plan: RN CM will make outreach attempt to patient within one business day if no return call.    Enzo Montgomery, RN,BSN,CCM [facility] Telephonic Care Management Coordinator Direct Phone: [phone] Toll Free: [phone] Fax: [phone]

## 2017-04-27 NOTE — Telephone Encounter (Signed)
Called to advise on message. Per Dr Erlinda Hong he approved orders

## 2017-04-27 NOTE — Telephone Encounter (Signed)
Called Estill Bamberg to advise per Dr Erlinda Hong he approved orders

## 2017-04-28 ENCOUNTER — Other Ambulatory Visit: Payer: Self-pay

## 2017-04-28 DIAGNOSIS — Z23 Encounter for immunization: Secondary | ICD-10-CM | POA: Diagnosis not present

## 2017-04-28 DIAGNOSIS — S7292XD Unspecified fracture of left femur, subsequent encounter for closed fracture with routine healing: Secondary | ICD-10-CM | POA: Diagnosis not present

## 2017-04-28 DIAGNOSIS — Z09 Encounter for follow-up examination after completed treatment for conditions other than malignant neoplasm: Secondary | ICD-10-CM | POA: Diagnosis not present

## 2017-04-28 NOTE — Patient Outreach (Signed)
[facility]) Care Management  04/28/2017  DALPHINE COWIE 03/03/1951 [id number]   Transition of Care Referral  Referral Date: 04/27/17 Referral Source: HTA Discharge Report Date of Admission: [facility] 03/31/17 Diagnosis: fall, left hip fx Date of Discharge: transferred from [facility] to [place] on 04/06/17-04/23/17 Facility: [facility] Insurance: HTA   Outreach attempt #2 to patient. Someone answered the phone but would never say anything and then hung up.     Plan: RN CM will make outreach attempt to patient within one business day.    Enzo Montgomery, RN,BSN,CCM [facility] Telephonic Care Management Coordinator Direct Phone: [phone] Toll Free: [phone] Fax: [phone]

## 2017-04-29 ENCOUNTER — Other Ambulatory Visit: Payer: Self-pay

## 2017-04-29 DIAGNOSIS — K219 Gastro-esophageal reflux disease without esophagitis: Secondary | ICD-10-CM | POA: Diagnosis not present

## 2017-04-29 DIAGNOSIS — S72002D Fracture of unspecified part of neck of left femur, subsequent encounter for closed fracture with routine healing: Secondary | ICD-10-CM | POA: Diagnosis not present

## 2017-04-29 DIAGNOSIS — D649 Anemia, unspecified: Secondary | ICD-10-CM | POA: Diagnosis not present

## 2017-04-29 DIAGNOSIS — F329 Major depressive disorder, single episode, unspecified: Secondary | ICD-10-CM | POA: Diagnosis not present

## 2017-04-29 DIAGNOSIS — M810 Age-related osteoporosis without current pathological fracture: Secondary | ICD-10-CM | POA: Diagnosis not present

## 2017-04-29 DIAGNOSIS — R1311 Dysphagia, oral phase: Secondary | ICD-10-CM | POA: Diagnosis not present

## 2017-04-29 DIAGNOSIS — G4700 Insomnia, unspecified: Secondary | ICD-10-CM | POA: Diagnosis not present

## 2017-04-29 DIAGNOSIS — I6529 Occlusion and stenosis of unspecified carotid artery: Secondary | ICD-10-CM | POA: Diagnosis not present

## 2017-04-29 DIAGNOSIS — E440 Moderate protein-calorie malnutrition: Secondary | ICD-10-CM | POA: Diagnosis not present

## 2017-04-29 DIAGNOSIS — I2510 Atherosclerotic heart disease of native coronary artery without angina pectoris: Secondary | ICD-10-CM | POA: Diagnosis not present

## 2017-04-29 DIAGNOSIS — Z96642 Presence of left artificial hip joint: Secondary | ICD-10-CM | POA: Diagnosis not present

## 2017-04-29 DIAGNOSIS — G629 Polyneuropathy, unspecified: Secondary | ICD-10-CM | POA: Diagnosis not present

## 2017-04-29 DIAGNOSIS — E785 Hyperlipidemia, unspecified: Secondary | ICD-10-CM | POA: Diagnosis not present

## 2017-04-29 DIAGNOSIS — J418 Mixed simple and mucopurulent chronic bronchitis: Secondary | ICD-10-CM | POA: Diagnosis not present

## 2017-04-29 DIAGNOSIS — F419 Anxiety disorder, unspecified: Secondary | ICD-10-CM | POA: Diagnosis not present

## 2017-04-29 DIAGNOSIS — I429 Cardiomyopathy, unspecified: Secondary | ICD-10-CM | POA: Diagnosis not present

## 2017-04-29 DIAGNOSIS — M1991 Primary osteoarthritis, unspecified site: Secondary | ICD-10-CM | POA: Diagnosis not present

## 2017-04-29 DIAGNOSIS — G43909 Migraine, unspecified, not intractable, without status migrainosus: Secondary | ICD-10-CM | POA: Diagnosis not present

## 2017-04-29 NOTE — Patient Outreach (Signed)
Dorothy White [facility]) Care Management  04/29/2017  Dorothy White April 28, 1951 [id number]   Transition of Care Referral  Referral Date: 04/27/17 Referral Source: HTA Discharge Report Date of Admission: [facility] 03/31/17 Diagnosis: fall, left hip fx Date of Discharge: transferred from [facility] to [place] on 04/06/17-04/23/17 Facility: [facility] Insurance: HTA    Outreach attempt #3 to patient. No answer at present and RN CM unable to leave message. No alternate numbers to attempt.       Plan: RN CM will send unsuccessful outreach letter to patient and close case if no response within 10 business days.   Enzo Montgomery, RN,BSN,CCM [facility] Telephonic Care Management Coordinator Direct Phone: [phone] Toll Free: [phone] Fax: [phone]

## 2017-05-04 DIAGNOSIS — K219 Gastro-esophageal reflux disease without esophagitis: Secondary | ICD-10-CM | POA: Diagnosis not present

## 2017-05-04 DIAGNOSIS — I2510 Atherosclerotic heart disease of native coronary artery without angina pectoris: Secondary | ICD-10-CM | POA: Diagnosis not present

## 2017-05-04 DIAGNOSIS — S72002D Fracture of unspecified part of neck of left femur, subsequent encounter for closed fracture with routine healing: Secondary | ICD-10-CM | POA: Diagnosis not present

## 2017-05-04 DIAGNOSIS — G43909 Migraine, unspecified, not intractable, without status migrainosus: Secondary | ICD-10-CM | POA: Diagnosis not present

## 2017-05-04 DIAGNOSIS — G629 Polyneuropathy, unspecified: Secondary | ICD-10-CM | POA: Diagnosis not present

## 2017-05-04 DIAGNOSIS — E785 Hyperlipidemia, unspecified: Secondary | ICD-10-CM | POA: Diagnosis not present

## 2017-05-04 DIAGNOSIS — I429 Cardiomyopathy, unspecified: Secondary | ICD-10-CM | POA: Diagnosis not present

## 2017-05-04 DIAGNOSIS — R1311 Dysphagia, oral phase: Secondary | ICD-10-CM | POA: Diagnosis not present

## 2017-05-04 DIAGNOSIS — M810 Age-related osteoporosis without current pathological fracture: Secondary | ICD-10-CM | POA: Diagnosis not present

## 2017-05-04 DIAGNOSIS — E440 Moderate protein-calorie malnutrition: Secondary | ICD-10-CM | POA: Diagnosis not present

## 2017-05-04 DIAGNOSIS — D649 Anemia, unspecified: Secondary | ICD-10-CM | POA: Diagnosis not present

## 2017-05-04 DIAGNOSIS — J418 Mixed simple and mucopurulent chronic bronchitis: Secondary | ICD-10-CM | POA: Diagnosis not present

## 2017-05-04 DIAGNOSIS — F329 Major depressive disorder, single episode, unspecified: Secondary | ICD-10-CM | POA: Diagnosis not present

## 2017-05-04 DIAGNOSIS — Z96642 Presence of left artificial hip joint: Secondary | ICD-10-CM | POA: Diagnosis not present

## 2017-05-04 DIAGNOSIS — G4700 Insomnia, unspecified: Secondary | ICD-10-CM | POA: Diagnosis not present

## 2017-05-04 DIAGNOSIS — M1991 Primary osteoarthritis, unspecified site: Secondary | ICD-10-CM | POA: Diagnosis not present

## 2017-05-04 DIAGNOSIS — F419 Anxiety disorder, unspecified: Secondary | ICD-10-CM | POA: Diagnosis not present

## 2017-05-04 DIAGNOSIS — I6529 Occlusion and stenosis of unspecified carotid artery: Secondary | ICD-10-CM | POA: Diagnosis not present

## 2017-05-05 DIAGNOSIS — G8929 Other chronic pain: Secondary | ICD-10-CM | POA: Diagnosis not present

## 2017-05-05 DIAGNOSIS — G894 Chronic pain syndrome: Secondary | ICD-10-CM | POA: Diagnosis not present

## 2017-05-05 DIAGNOSIS — M5012 Mid-cervical disc disorder, unspecified level: Secondary | ICD-10-CM | POA: Diagnosis not present

## 2017-05-05 DIAGNOSIS — M5442 Lumbago with sciatica, left side: Secondary | ICD-10-CM | POA: Diagnosis not present

## 2017-05-10 ENCOUNTER — Emergency Department (HOSPITAL_COMMUNITY): Payer: PPO

## 2017-05-10 ENCOUNTER — Emergency Department (HOSPITAL_COMMUNITY)
Admission: EM | Admit: 2017-05-10 | Discharge: 2017-05-10 | Disposition: A | Discharge status: Home / Self Care | Payer: PPO | Attending: Emergency Medicine | Admitting: Emergency Medicine

## 2017-05-10 ENCOUNTER — Encounter (HOSPITAL_COMMUNITY): Payer: Self-pay | Admitting: Emergency Medicine

## 2017-05-10 DIAGNOSIS — M25551 Pain in right hip: Secondary | ICD-10-CM | POA: Diagnosis not present

## 2017-05-10 DIAGNOSIS — M25552 Pain in left hip: Secondary | ICD-10-CM | POA: Insufficient documentation

## 2017-05-10 DIAGNOSIS — M545 Low back pain, unspecified: Secondary | ICD-10-CM

## 2017-05-10 DIAGNOSIS — Z79899 Other long term (current) drug therapy: Secondary | ICD-10-CM | POA: Diagnosis not present

## 2017-05-10 DIAGNOSIS — I2510 Atherosclerotic heart disease of native coronary artery without angina pectoris: Secondary | ICD-10-CM | POA: Diagnosis not present

## 2017-05-10 DIAGNOSIS — M791 Myalgia, unspecified site: Secondary | ICD-10-CM | POA: Diagnosis not present

## 2017-05-10 DIAGNOSIS — Z96641 Presence of right artificial hip joint: Secondary | ICD-10-CM | POA: Insufficient documentation

## 2017-05-10 DIAGNOSIS — M7918 Myalgia, other site: Secondary | ICD-10-CM

## 2017-05-10 DIAGNOSIS — W19XXXA Unspecified fall, initial encounter: Secondary | ICD-10-CM

## 2017-05-10 DIAGNOSIS — R296 Repeated falls: Secondary | ICD-10-CM | POA: Diagnosis not present

## 2017-05-10 DIAGNOSIS — F17210 Nicotine dependence, cigarettes, uncomplicated: Secondary | ICD-10-CM | POA: Insufficient documentation

## 2017-05-10 MED ORDER — IBUPROFEN 600 MG PO TABS: 600.0000 mg | ORAL_TABLET | Freq: Four times a day (QID) | ORAL | 0 refills | Status: DC | PRN

## 2017-05-10 MED ORDER — IBUPROFEN 200 MG PO TABS
600.0000 mg | ORAL_TABLET | Freq: Once | ORAL | Status: AC
  Administered 2017-05-10: 600 mg via ORAL
  Filled 2017-05-10: qty 3

## 2017-05-10 MED ORDER — OXYCODONE-ACETAMINOPHEN 5-325 MG PO TABS
1.0000 | ORAL_TABLET | Freq: Once | ORAL | Status: AC
  Administered 2017-05-10: 1 via ORAL
  Filled 2017-05-10: qty 1

## 2017-05-10 NOTE — Discharge Instructions (Signed)
Please read and follow all provided instructions.  Your diagnoses today include:  1. Fall, initial encounter   2. Bilateral hip pain   3. Acute bilateral low back pain without sciatica   4. Musculoskeletal pain     Tests performed today include: Vital signs. See below for your results today.   Medications prescribed:  Take as prescribed   Home care instructions:  Follow any educational materials contained in this packet.  Follow-up instructions: Please follow-up with your primary care provider for further evaluation of symptoms and treatment   Return instructions:  Please return to the Emergency Department if you do not get better, if you get worse, or new symptoms OR  - Fever (temperature greater than 101.34F)  - Bleeding that does not stop with holding pressure to the area    -Severe pain (please note that you may be more sore the day after your accident)  - Chest Pain  - Difficulty breathing  - Severe nausea or vomiting  - Inability to tolerate food and liquids  - Passing out  - Skin becoming red around your wounds  - Change in mental status (confusion or lethargy)  - New numbness or weakness    Please return if you have any other emergent concerns.  Additional Information:  Your vital signs today were: BP 121/66 (BP Location: Right Arm)    Pulse 64    Temp 98.2 F (36.8 C) (Oral)    Resp 14    SpO2 100%  If your blood pressure (BP) was elevated above 135/85 this visit, please have this repeated by your doctor within one month. ---------------
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
## 2017-05-10 NOTE — ED Triage Notes (Signed)
Patient c/o bilateral hip pain and lower back pain after multiple falls yesterday. Denies neck pain and LOC. Ambulatory with cane. Reports left hip replacement in September.

## 2017-05-10 NOTE — ED Notes (Signed)
Bed: WA01 Expected date:  Expected time:  Means of arrival:  Comments: 

## 2017-05-11 DIAGNOSIS — D649 Anemia, unspecified: Secondary | ICD-10-CM | POA: Diagnosis not present

## 2017-05-11 DIAGNOSIS — I2510 Atherosclerotic heart disease of native coronary artery without angina pectoris: Secondary | ICD-10-CM | POA: Diagnosis not present

## 2017-05-11 DIAGNOSIS — E440 Moderate protein-calorie malnutrition: Secondary | ICD-10-CM | POA: Diagnosis not present

## 2017-05-11 DIAGNOSIS — M1991 Primary osteoarthritis, unspecified site: Secondary | ICD-10-CM | POA: Diagnosis not present

## 2017-05-11 DIAGNOSIS — E785 Hyperlipidemia, unspecified: Secondary | ICD-10-CM | POA: Diagnosis not present

## 2017-05-11 DIAGNOSIS — I429 Cardiomyopathy, unspecified: Secondary | ICD-10-CM | POA: Diagnosis not present

## 2017-05-11 DIAGNOSIS — F419 Anxiety disorder, unspecified: Secondary | ICD-10-CM | POA: Diagnosis not present

## 2017-05-11 DIAGNOSIS — S72002D Fracture of unspecified part of neck of left femur, subsequent encounter for closed fracture with routine healing: Secondary | ICD-10-CM | POA: Diagnosis not present

## 2017-05-11 DIAGNOSIS — F329 Major depressive disorder, single episode, unspecified: Secondary | ICD-10-CM | POA: Diagnosis not present

## 2017-05-11 DIAGNOSIS — Z96642 Presence of left artificial hip joint: Secondary | ICD-10-CM | POA: Diagnosis not present

## 2017-05-11 DIAGNOSIS — R1311 Dysphagia, oral phase: Secondary | ICD-10-CM | POA: Diagnosis not present

## 2017-05-11 DIAGNOSIS — G43909 Migraine, unspecified, not intractable, without status migrainosus: Secondary | ICD-10-CM | POA: Diagnosis not present

## 2017-05-11 DIAGNOSIS — J418 Mixed simple and mucopurulent chronic bronchitis: Secondary | ICD-10-CM | POA: Diagnosis not present

## 2017-05-11 DIAGNOSIS — G4700 Insomnia, unspecified: Secondary | ICD-10-CM | POA: Diagnosis not present

## 2017-05-11 DIAGNOSIS — G629 Polyneuropathy, unspecified: Secondary | ICD-10-CM | POA: Diagnosis not present

## 2017-05-11 DIAGNOSIS — K219 Gastro-esophageal reflux disease without esophagitis: Secondary | ICD-10-CM | POA: Diagnosis not present

## 2017-05-11 DIAGNOSIS — I6529 Occlusion and stenosis of unspecified carotid artery: Secondary | ICD-10-CM | POA: Diagnosis not present

## 2017-05-11 DIAGNOSIS — M810 Age-related osteoporosis without current pathological fracture: Secondary | ICD-10-CM | POA: Diagnosis not present

## 2017-05-13 ENCOUNTER — Other Ambulatory Visit: Payer: Self-pay

## 2017-05-13 NOTE — Patient Outreach (Signed)
[facility]) Care Management  05/13/2017  ERICKA MARCELLUS 1950-11-07 [id number]   Transition of Care Referral  Referral Date: 04/27/17 Referral Source: [facility] Discharge Report Date of Admission: [facility] 03/31/17 Diagnosis: fall, left hip fx Date of Discharge: transferred from [facility] to [place] on 04/06/17-04/23/17 Facility: [facility] Insurance: [employer]    Multiple attempts to establish contact with patient without success. No response from letter mailed to patient. Case is being closed at this time.     Plan: RN CM will notify [facility] administrative assistant of case status. RN CM will send MD case closure letter.   Enzo Montgomery, RN,BSN,CCM [place] Management Telephonic Care Management Coordinator Direct Phone: [phone] Toll Free: [phone] Fax: [phone]

## 2017-05-15 ENCOUNTER — Encounter (HOSPITAL_COMMUNITY): Payer: Self-pay | Admitting: Emergency Medicine

## 2017-05-15 ENCOUNTER — Ambulatory Visit (INDEPENDENT_AMBULATORY_CARE_PROVIDER_SITE_OTHER): Payer: PPO | Admitting: Orthopaedic Surgery

## 2017-05-15 ENCOUNTER — Emergency Department (HOSPITAL_COMMUNITY)
Admission: EM | Admit: 2017-05-15 | Discharge: 2017-05-15 | Disposition: A | Discharge status: Home / Self Care | Payer: PPO | Attending: Emergency Medicine | Admitting: Emergency Medicine

## 2017-05-15 DIAGNOSIS — M542 Cervicalgia: Secondary | ICD-10-CM | POA: Insufficient documentation

## 2017-05-15 DIAGNOSIS — S72002D Fracture of unspecified part of neck of left femur, subsequent encounter for closed fracture with routine healing: Secondary | ICD-10-CM

## 2017-05-15 DIAGNOSIS — Z5321 Procedure and treatment not carried out due to patient leaving prior to being seen by health care provider: Secondary | ICD-10-CM | POA: Insufficient documentation
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
## 2017-05-15 NOTE — ED Notes (Addendum)
Pt did not answer when called

## 2017-05-15 NOTE — ED Triage Notes (Signed)
Patient reports that she had multiple falls over the weekend and been having neck pain for past couple days. Patient able to rotate head from side to side. Patient also c/o right side pain as well.

## 2017-05-18 ENCOUNTER — Other Ambulatory Visit: Payer: Self-pay

## 2017-05-18 ENCOUNTER — Emergency Department (HOSPITAL_COMMUNITY)
Admission: EM | Admit: 2017-05-18 | Discharge: 2017-05-19 | Disposition: A | Discharge status: Home / Self Care | Payer: PPO | Attending: Emergency Medicine | Admitting: Emergency Medicine

## 2017-05-18 ENCOUNTER — Emergency Department (HOSPITAL_COMMUNITY): Payer: PPO

## 2017-05-18 ENCOUNTER — Encounter (HOSPITAL_COMMUNITY): Payer: Self-pay | Admitting: Emergency Medicine

## 2017-05-18 DIAGNOSIS — R7989 Other specified abnormal findings of blood chemistry: Secondary | ICD-10-CM | POA: Diagnosis not present

## 2017-05-18 DIAGNOSIS — F29 Unspecified psychosis not due to a substance or known physiological condition: Secondary | ICD-10-CM | POA: Diagnosis not present

## 2017-05-18 DIAGNOSIS — Z96642 Presence of left artificial hip joint: Secondary | ICD-10-CM | POA: Diagnosis not present

## 2017-05-18 DIAGNOSIS — R0789 Other chest pain: Secondary | ICD-10-CM | POA: Diagnosis not present

## 2017-05-18 DIAGNOSIS — Z79899 Other long term (current) drug therapy: Secondary | ICD-10-CM | POA: Insufficient documentation

## 2017-05-18 DIAGNOSIS — F17210 Nicotine dependence, cigarettes, uncomplicated: Secondary | ICD-10-CM | POA: Diagnosis not present

## 2017-05-18 DIAGNOSIS — R079 Chest pain, unspecified: Secondary | ICD-10-CM | POA: Diagnosis not present

## 2017-05-18 DIAGNOSIS — F419 Anxiety disorder, unspecified: Secondary | ICD-10-CM | POA: Insufficient documentation

## 2017-05-18 DIAGNOSIS — R443 Hallucinations, unspecified: Secondary | ICD-10-CM | POA: Diagnosis not present

## 2017-05-18 LAB — URINALYSIS, ROUTINE W REFLEX MICROSCOPIC
Bacteria, UA: NONE SEEN
Bilirubin Urine: NEGATIVE
Glucose, UA: NEGATIVE mg/dL
HGB URINE DIPSTICK: NEGATIVE
Ketones, ur: 5 mg/dL — AB
LEUKOCYTES UA: NEGATIVE
Nitrite: NEGATIVE
Protein, ur: 30 mg/dL — AB
RBC / HPF: NONE SEEN RBC/hpf (ref 0–5)
SPECIFIC GRAVITY, URINE: 1.018 (ref 1.005–1.030)
pH: 6 (ref 5.0–8.0)

## 2017-05-18 LAB — I-STAT TROPONIN, ED
TROPONIN I, POC: 0 ng/mL (ref 0.00–0.08)
TROPONIN I, POC: 0 ng/mL (ref 0.00–0.08)

## 2017-05-18 LAB — RAPID URINE DRUG SCREEN, HOSP PERFORMED
AMPHETAMINES: NOT DETECTED
BARBITURATES: NOT DETECTED
Benzodiazepines: NOT DETECTED
COCAINE: NOT DETECTED
Opiates: POSITIVE — AB
TETRAHYDROCANNABINOL: NOT DETECTED

## 2017-05-18 LAB — CBC
HCT: 33.2 % — ABNORMAL LOW (ref 36.0–46.0)
HEMOGLOBIN: 10.5 g/dL — AB (ref 12.0–15.0)
MCH: 27.6 pg (ref 26.0–34.0)
MCHC: 31.6 g/dL (ref 30.0–36.0)
MCV: 87.4 fL (ref 78.0–100.0)
PLATELETS: 181 10*3/uL (ref 150–400)
RBC: 3.8 MIL/uL — ABNORMAL LOW (ref 3.87–5.11)
RDW: 13.7 % (ref 11.5–15.5)
WBC: 6.3 10*3/uL (ref 4.0–10.5)

## 2017-05-18 LAB — BASIC METABOLIC PANEL
ANION GAP: 9 (ref 5–15)
BUN: 17 mg/dL (ref 6–20)
CALCIUM: 9.4 mg/dL (ref 8.9–10.3)
CO2: 25 mmol/L (ref 22–32)
CREATININE: 0.75 mg/dL (ref 0.44–1.00)
Chloride: 105 mmol/L (ref 101–111)
GFR calc Af Amer: >60 mL/min (ref >60)
GLUCOSE: 124 mg/dL — AB (ref 65–99)
Potassium: 3.8 mmol/L (ref 3.5–5.1)
Sodium: 139 mmol/L (ref 135–145)

## 2017-05-18 LAB — TROPONIN I: Troponin I: <0.03 ng/mL (ref <0.03)

## 2017-05-18 LAB — D-DIMER, QUANTITATIVE: D-Dimer, Quant: 1.32 ug/mL-FEU — ABNORMAL HIGH (ref 0.00–0.50)

## 2017-05-18 LAB — ETHANOL: Alcohol, Ethyl (B): <5 mg/dL (ref <10)

## 2017-05-18 MED ORDER — IOPAMIDOL (ISOVUE-370) INJECTION 76%
INTRAVENOUS | Status: AC
  Administered 2017-05-18: 100 mL
  Filled 2017-05-18: qty 100

## 2017-05-18 MED ORDER — FLUTICASONE PROPIONATE 50 MCG/ACT NA SUSP
1.0000 | Freq: Every day | NASAL | Status: DC
  Administered 2017-05-18 – 2017-05-19 (×2): 1 via NASAL
  Filled 2017-05-18 (×3): qty 16

## 2017-05-18 MED ORDER — ACETAMINOPHEN 325 MG PO TABS
650.0000 mg | ORAL_TABLET | Freq: Four times a day (QID) | ORAL | Status: DC | PRN
  Administered 2017-05-18 (×2): 650 mg via ORAL
  Filled 2017-05-18 (×2): qty 2

## 2017-05-18 MED ORDER — TRAZODONE HCL 100 MG PO TABS
300.0000 mg | ORAL_TABLET | Freq: Every evening | ORAL | Status: DC | PRN
  Administered 2017-05-18: 300 mg via ORAL
  Filled 2017-05-18: qty 3

## 2017-05-18 MED ORDER — VITAMIN E 45 MG (100 UNIT) PO CAPS
200.0000 [IU] | ORAL_CAPSULE | Freq: Every evening | ORAL | Status: DC
  Administered 2017-05-18: 200 [IU] via ORAL
  Filled 2017-05-18 (×3): qty 2

## 2017-05-18 MED ORDER — FLUOXETINE HCL 20 MG PO CAPS
60.0000 mg | ORAL_CAPSULE | ORAL | Status: DC
  Administered 2017-05-18 – 2017-05-19 (×2): 60 mg via ORAL
  Filled 2017-05-18 (×5): qty 3

## 2017-05-18 MED ORDER — CLONAZEPAM 0.5 MG PO TABS
1.0000 mg | ORAL_TABLET | Freq: Two times a day (BID) | ORAL | Status: DC | PRN
Start: 2017-05-18 — End: 2017-05-19
  Administered 2017-05-18: 1 mg via ORAL
  Filled 2017-05-18: qty 2

## 2017-05-18 MED ORDER — ONDANSETRON HCL 4 MG PO TABS: 8.0000 mg | ORAL_TABLET | Freq: Three times a day (TID) | ORAL | Status: DC | PRN

## 2017-05-18 MED ORDER — VITAMIN B-12 1000 MCG PO TABS
1000.0000 ug | ORAL_TABLET | Freq: Every day | ORAL | Status: DC
  Administered 2017-05-18 – 2017-05-19 (×2): 1000 ug via ORAL
  Filled 2017-05-18 (×4): qty 1

## 2017-05-18 MED ORDER — ELETRIPTAN HYDROBROMIDE 40 MG PO TABS
40.0000 mg | ORAL_TABLET | ORAL | Status: DC | PRN
  Filled 2017-05-18: qty 1

## 2017-05-18 MED ORDER — TOPIRAMATE 25 MG PO TABS
100.0000 mg | ORAL_TABLET | Freq: Every day | ORAL | Status: DC
  Administered 2017-05-18: 100 mg via ORAL
  Filled 2017-05-18 (×2): qty 4

## 2017-05-18 NOTE — Patient Outreach (Signed)
[facility]) Care Management  05/18/2017  Dorothy White [birth year] [id number]   1st telephone call to patient for ED Utilization screening.  No answer.  Unable to leave message.  No other number available to call.  Plan: RN Health Coach will make outreach attempt to patient within three business days.  Lazaro Arms RN, BSN, [place] Direct Dial:  [phone] Fax: [phone]

## 2017-05-18 NOTE — ED Notes (Signed)
ED Provider at bedside. 

## 2017-05-18 NOTE — BH Assessment (Signed)
Tele Assessment Note   Patient Name: Dorothy White MRN: [id number] Referring Physician: Ward, Ozella Almond, PA-C Location of Patient:  [facility] ED Location of Provider: Craig is an 66 y.o. female present to [facility] ED for chest pain and auditory / visual hallucinations. Patient report she has been experiencing auditory / visual hallucinations for the past two days. Report hearing kids playing in a bedroom of her home. When she enters the bedroom she no longer hears the children playing, however, she sees a child standing in the corner. When she tires to speak to the child patient report the child disappears. Patient report mental health history of depression, prescribed Prozac. Denies current depressive symptoms. Denies other mental health symptoms or diagnosis. Denies substance use or additional stressors in her life besides experiencing chest pain. Within the last two weeks report started medication Oxymorphone. Patient primary doctor, Dr. Arbie Cookey Web prescribes medication. Patient report normal sleep with medication aid and healthy appetite.   Patient denied suicidal or homicidal ideations. Patient presented with pleasant mood, speech, tone, and affect. Patient report no access to weapons. Patient lives with a female friend, report she can residents. Patient anxiety level increased as she report she has medical condition which has her nervous. Patient denies history of abuse.  Disposition: Per Elmarie Shiley, NP, monitor to evaluate for continued report of hallucinations during the next 24 hours. Re-evaluate patient in the a.m.   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 Anemia    ?  Marland Kitchen Anxiety   . Arthritis    "everywhere" (04/25/2016)  . 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 Chronic back pain    "all over" (04/25/2016)  . Constipation    . Depression   . Diverticulitis   . GERD (gastroesophageal reflux disease)   . Headache    "weekly" (04/25/2016)  . 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 HLD (hyperlipidemia)    hx (04/25/2016)  . 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 Mild CAD    a. 25% mLAD, otherwise no sig disease 01/2016 cath.  Marland Kitchen NICM (nonischemic cardiomyopathy) ([place])    a. EF 40-45% by echo 07/6008 at time of complicated migraine/neuro sx, 55-65% at time of cath 01/2016  . Stroke [facility]) 06/2013   "mini" stroke , ?possibly hemaplegic migraine per pt    Past Surgical History:  Procedure Laterality Date  . ANTERIOR APPROACH HEMI HIP ARTHROPLASTY Left 04/01/2017   Procedure: LEFT DIRECT ANTERIOR TOTAL HIP REPLACEMENT;  Surgeon: Leandrew Koyanagi, MD;  Location: [place];  Service: Orthopedics;  Laterality: Left;  LEFT DIRECT ANTERIOR TOTAL HIP REPLACEMENT  . AUGMENTATION MAMMAPLASTY  1980  . BASAL CELL CARCINOMA EXCISION     "tip of my nose"  . BUNIONECTOMY Bilateral 10/2003  . CARDIAC CATHETERIZATION N/A 02/20/2016   Procedure: Left Heart Cath and Coronary Angiography;  Surgeon: Jettie Booze, MD;  Location: [place] [facility];  Service: Cardiovascular;  Laterality: N/A;  . COLONOSCOPY    . DILATION AND CURETTAGE OF UTERUS    . FRACTURE SURGERY    . IR GENERIC HISTORICAL  03/18/2016   IR SINUS/FIST TUBE CHK-NON GI 03/18/2016 Aletta Edouard, MD [facility]-INTERV RAD  . LAPAROSCOPIC SIGMOID COLECTOMY N/A 04/25/2016   Procedure: LAPAROSCOPIC SIGMOID COLECTOMY;  Surgeon: Stark Klein, MD;  Location: [place];  Service: General;  Laterality: N/A;  . OOPHORECTOMY Bilateral ~ 1999  . ORIF HUMERUS FRACTURE Right 02/15/2015   Procedure: OPEN REDUCTION INTERNAL FIXATION (ORIF) PROXIMAL HUMERUS FRACTURE;  Surgeon: Netta Cedars, MD;  Location: [place];  Service: Orthopedics;  Laterality: Right;  . RHINOPLASTY  1976  . TONSILLECTOMY    . TUBAL LIGATION  ~ 1983  . VAGINAL HYSTERECTOMY  ~  1998    Family History:  Family History  Problem Relation Age of Onset  . Lung cancer Mother 74  . Migraines Mother   . Heart attack Father        Vague  . Hypertension Brother   . Esophageal cancer Brother   . Diabetes Paternal Grandmother        questionable  . Colon cancer Neg Hx   . Kidney disease Neg Hx   . Liver disease Neg Hx     Social History:  reports that she has been smoking Cigarettes.  She has a 34.00 pack-year smoking history. She has never used smokeless tobacco. She reports that she drinks about 0.6 oz of alcohol per week . She reports that she does not use drugs.  Additional Social History:  Alcohol / Drug Use Pain Medications: see MAR Prescriptions: see MAR Over the Counter: see MAR History of alcohol / drug use?: No history of alcohol / drug abuse  CIWA: CIWA-Ar BP: 120/65 Pulse Rate: 65 COWS:    PATIENT STRENGTHS: (choose at least two) Ability for insight Average or above average intelligence Capable of independent living Communication skills Supportive family/friends  Allergies:  Allergies  Allergen Reactions  . Penicillins Hives    Has patient had a PCN reaction causing immediate rash, facial/tongue/throat swelling, SOB or lightheadedness with hypotension: Yes Has patient had a PCN reaction causing severe rash involving mucus membranes or skin necrosis: No Has patient had a PCN reaction that required hospitalization No Has patient had a PCN reaction occurring within the last 10 years: No If all of the above answers are "NO", then may proceed with Cephalosporin use.     Home Medications:  (Not in a hospital admission)  OB/GYN Status:  No LMP recorded. Patient has had a hysterectomy.  General Assessment Data Location of Assessment: [facility] ED TTS Assessment: In system Is this a Tele or Face-to-Face Assessment?: Tele Assessment Is this an Initial Assessment or a Re-assessment for this encounter?: Initial Assessment Marital status:  Divorced [place] name: [place] Is patient pregnant?: No Pregnancy Status: No Living Arrangements: Non-relatives/Friends Can pt return to current living arrangement?: Yes Admission Status: Voluntary Is patient capable of signing voluntary admission?: Yes Referral Source: Self/Family/Friend Insurance type: [employer] Advantage     Crisis Care Plan Living Arrangements: Non-relatives/Friends Name of Psychiatrist: none reported (primary care doctor prescribe medication for depression) Name of Therapist: none reported  Education Status Is patient currently in school?: No Highest grade of school patient has completed: High School  Risk to self with the past 6 months Suicidal Ideation: No-Not Currently/Within Last 6 Months Has patient been a risk to self within the past 6 months prior to admission? : No Suicidal Intent: No Has patient had any suicidal intent within the past 6 months prior to admission? : No Is patient at risk for suicide?: No Suicidal Plan?: No Has patient had any suicidal plan within the past 6 months prior to admission? : No Access to Means: No What has been your use of drugs/alcohol within the last 12 months?: none reported Previous Attempts/Gestures: No How  many times?: 0 Other Self Harm Risks: none reported Triggers for Past Attempts: None known Intentional Self Injurious Behavior: None Family Suicide History: No Persecutory voices/beliefs?: No Depression: Yes (history of depression,denies current depression symptoms) Substance abuse history and/or treatment for substance abuse?: No Suicide prevention information given to non-admitted patients: Not applicable  Risk to Others within the past 6 months Homicidal Ideation: No Does patient have any lifetime risk of violence toward others beyond the six months prior to admission? : No Thoughts of Harm to Others: No Current Homicidal Intent: No Current Homicidal Plan: No Access to Homicidal Means: No Identified  Victim: n/a History of harm to others?: No Assessment of Violence: None Noted Violent Behavior Description: none noted Does patient have access to weapons?: No Criminal Charges Pending?: No Does patient have a court date: No Is patient on probation?: No  Psychosis Hallucinations: Auditory, Visual (report hearing kids playing, seeing kids in corner of a room) Delusions: None noted  Mental Status Report Appearance/Hygiene: In scrubs Eye Contact: Good Motor Activity: Freedom of movement Speech: Logical/coherent Level of Consciousness: Alert Mood: Anxious Affect: Anxious Anxiety Level: Minimal Thought Processes: Circumstantial (visual and auditory hallucinations. ) Judgement: Partial Orientation: Person, Place, Time, Situation, Appropriate for developmental age Obsessive Compulsive Thoughts/Behaviors: None  Cognitive Functioning Concentration: Good Memory: Recent Intact, Remote Intact IQ: Average Insight: Good Impulse Control: Good Appetite: Good Weight Loss: 0 Weight Gain: 0 Sleep: Decreased (report has to take medication to sleep) Total Hours of Sleep:  (report sleep varies) Vegetative Symptoms: None  ADLScreening [facility] Assessment Services) Patient's cognitive ability adequate to safely complete daily activities?: Yes Patient able to express need for assistance with ADLs?: Yes Independently performs ADLs?: Yes (appropriate for developmental age)  Prior Inpatient Therapy Prior Inpatient Therapy: No  Prior Outpatient Therapy Prior Outpatient Therapy: No  ADL Screening (condition at time of admission) Patient's cognitive ability adequate to safely complete daily activities?: Yes Is the patient deaf or have difficulty hearing?: No Does the patient have difficulty seeing, even when wearing glasses/contacts?: No Does the patient have difficulty concentrating, remembering, or making decisions?: No Patient able to express need for assistance with ADLs?: Yes Does the  patient have difficulty dressing or bathing?: No Independently performs ADLs?: Yes (appropriate for developmental age) Does the patient have difficulty walking or climbing stairs?: No       Abuse/Neglect Assessment (Assessment to be complete while patient is alone) Physical Abuse: Denies Verbal Abuse: Denies Sexual Abuse: Denies Exploitation of patient/patient's resources: Denies Self-Neglect: Denies     Regulatory affairs officer (For Healthcare) Does Patient Have a Medical Advance Directive?: No Would patient like information on creating a medical advance directive?: No - Patient declined    Additional Information 1:1 In Past 12 Months?: No CIRT Risk: No Elopement Risk: No Does patient have medical clearance?: No     Disposition: Per Elmarie Shiley, NP, monitor to evaluate for continued report of hallucinations during the next 24 hours. Re-evaluate patient in the a.m. Disposition Initial Assessment Completed for this Encounter: Yes   Cortina Vultaggio 05/18/2017 1:02 PM

## 2017-05-18 NOTE — ED Notes (Signed)
Monitor placed in pt's room for TTS evaluation.  Pt is awake, denies any pain at present.  Pt is calm, cooperative and pleasant.  Warm blankets applied.

## 2017-05-18 NOTE — ED Provider Notes (Signed)
[facility] EMERGENCY DEPARTMENT Provider Note   CSN: [id number] Arrival date & time: 05/18/17  0431     History   Chief Complaint Chief Complaint  Patient presents with  . Chest Pain  . Anxiety  . Hallucinations    HPI Dorothy White is a 66 y.o. female with a past medical history of CAD, anxiety, depression, status post hip replacement about one month ago, who presents to ED for evaluation of 1 day history of chest pain. She states that the pain feels like a pressure and is intermittent, lasting a few seconds at a time. States that it begins in her left shoulder and radiates down to her chest. She does have a history of similar symptoms in the past. She denies any shortness of breath, hemoptysis, fevers, cough, URI symptoms, nausea, vomiting, abdominal pain. She also complains of hallucinations for the past 2 days. She states that she is seeing a young boy standing in front of her. She states that whenever she tries to speak to the boy, the boy disappears. She denies any auditory hallucinations, SI, HI or previous history of similar symptoms. She states that she was switched to a different pain medication 2 days ago and is unsure if this is causing her hallucinations. She states that she is currently on oxymorphone for pain.  HPI  Past Medical History:  Diagnosis Date  . Anemia    ?  Marland Kitchen Anxiety   . Arthritis    "everywhere" (04/25/2016)  . 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 Chronic back pain    "all over" (04/25/2016)  . Constipation   . Depression   . Diverticulitis   . GERD (gastroesophageal reflux disease)   . Headache    "weekly" (04/25/2016)  . 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 HLD (hyperlipidemia)    hx (04/25/2016)  . 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 Mild CAD    a. 25% mLAD, otherwise no sig disease 01/2016 cath.  Marland Kitchen NICM (nonischemic cardiomyopathy) ([place])    a. EF 40-45% by  echo 10/7094 at time of complicated migraine/neuro sx, 55-65% at time of cath 01/2016  . Stroke [facility]) 06/2013   "mini" stroke , ?possibly hemaplegic migraine per pt    Patient Active Problem List   Diagnosis Date Noted  . Recurrent falls 04/16/2017  . 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 At risk for adverse drug event 04/07/2017  . Acute blood loss as cause of postoperative anemia 04/04/2017  . Acute delirium 04/04/2017  . Osteoporosis 04/02/2017  . Closed fracture of neck of left femur ([place]) 03/31/2017  . Scalp contusion 03/31/2017  . Fall at home, initial encounter 03/31/2017  . Hip fracture ([place]) 03/31/2017  . 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 Hx of transient ischemic attack (TIA) 02/19/2017  . Left carotid stenosis- 40-59% 02/19/2017  . Malnutrition of moderate degree 02/18/2017  . Falls 02/17/2017  . Chest pain 09/26/2016  . Diverticulitis of sigmoid colon 04/25/2016  . Infection due to ESBL-producing Escherichia coli/Diverticular Abscess 03/15/2016  . Diverticulitis 03/12/2016  . Chronic pain syndrome 03/12/2016  . Lesion of pancreas 03/12/2016  . History of chest pain 03/12/2016  . Hypokalemia 03/12/2016  . Angina, class IV ([place]) - chest tightness and pressure with dyspnea with minimal exertion 02/17/2016    Class: Question of  . TIA (transient ischemic attack) 12/26/2015  . DOE (dyspnea on exertion) 12/26/2015  . Right  sided weakness 12/17/2015  . Ataxia 12/17/2015  . Shoulder fracture 02/15/2015  . Chronic headache 10/11/2013  . Weakness generalized 08/12/2013  . Dizziness and giddiness 08/12/2013  . Abnormality of gait 07/11/2013  . Hyperlipidemia 05/07/2007  . Migraine without aura 05/07/2007  . PREMATURE VENTRICULAR CONTRACTIONS, FREQUENT 05/07/2007  . GERD 05/07/2007  . DIVERTICULOSIS, COLON 05/07/2007  . 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 OSTEOPENIA 05/07/2007  . Anxiety and depression 03/24/2007  . HEMORRHOIDS 03/24/2007  .  ALLERGIC RHINITIS 03/24/2007  . LOW BACK PAIN 03/24/2007  . MIGRAINES, HX OF 03/24/2007    Past Surgical History:  Procedure Laterality Date  . ANTERIOR APPROACH HEMI HIP ARTHROPLASTY Left 04/01/2017   Procedure: LEFT DIRECT ANTERIOR TOTAL HIP REPLACEMENT;  Surgeon: Leandrew Koyanagi, MD;  Location: [facility];  Service: Orthopedics;  Laterality: Left;  LEFT DIRECT ANTERIOR TOTAL HIP REPLACEMENT  . AUGMENTATION MAMMAPLASTY  1980  . BASAL CELL CARCINOMA EXCISION     "tip of my nose"  . BUNIONECTOMY Bilateral 10/2003  . CARDIAC CATHETERIZATION N/A 02/20/2016   Procedure: Left Heart Cath and Coronary Angiography;  Surgeon: Jettie Booze, MD;  Location: [facility];  Service: Cardiovascular;  Laterality: N/A;  . COLONOSCOPY    . DILATION AND CURETTAGE OF UTERUS    . FRACTURE SURGERY    . IR GENERIC HISTORICAL  03/18/2016   IR SINUS/FIST TUBE CHK-NON GI 03/18/2016 Aletta Edouard, MD [facility]-INTERV RAD  . LAPAROSCOPIC SIGMOID COLECTOMY N/A 04/25/2016   Procedure: LAPAROSCOPIC SIGMOID COLECTOMY;  Surgeon: Stark Klein, MD;  Location: [facility];  Service: General;  Laterality: N/A;  . OOPHORECTOMY Bilateral ~ 1999  . ORIF HUMERUS FRACTURE Right 02/15/2015   Procedure: OPEN REDUCTION INTERNAL FIXATION (ORIF) PROXIMAL HUMERUS FRACTURE;  Surgeon: Netta Cedars, MD;  Location: [facility];  Service: Orthopedics;  Laterality: Right;  . RHINOPLASTY  1976  . TONSILLECTOMY    . TUBAL LIGATION  ~ 1983  . VAGINAL HYSTERECTOMY  ~ 1998    OB History    No data available       Home Medications    Prior to Admission medications   Medication Sig Start Date End Date Taking? Authorizing Provider  acetaminophen (TYLENOL) 325 MG tablet Take 650 mg by mouth every 6 (six) hours as needed.    [provider]  clonazePAM (KLONOPIN) 1 MG tablet Take 1 mg by mouth daily as needed for anxiety.    [provider]  feeding supplement, ENSURE ENLIVE, (ENSURE ENLIVE) LIQD Take 237 mLs by mouth 2 (two) times  daily between meals. 02/19/17   Debbe Odea, MD  FLUoxetine (PROZAC) 20 MG tablet Take 3 tablets (60 mg total) by mouth every morning. Take 3 tablets to = 60 mg qam 04/22/17   Medina-Vargas, Monina C, NP  gabapentin (NEURONTIN) 100 MG capsule Take 1 capsule (100 mg total) by mouth at bedtime. 04/22/17   Medina-Vargas, Monina C, NP  HYDROcodone-acetaminophen (NORCO) 7.5-325 MG tablet 1-2 tabs po daily prn pain 04/24/17   Leandrew Koyanagi, MD  ibuprofen (ADVIL,MOTRIN) 600 MG tablet Take 1 tablet (600 mg total) by mouth every 6 (six) hours as needed. 05/10/17   Shary Decamp, PA-C  LUTEIN PO Take 1 capsule by mouth 2 (two) times daily.     [provider]  mometasone (NASONEX) 50 MCG/ACT nasal spray Place 2 sprays into the nose daily. 04/22/17   Medina-Vargas, Monina C, NP  ondansetron (ZOFRAN) 8 MG tablet Take 1 tablet (8 mg total) by mouth every  8 (eight) hours as needed for nausea or vomiting. 04/22/17   Medina-Vargas, Monina C, NP  RELPAX 40 MG tablet Take 1 tablet (40 mg total) by mouth every 2 (two) hours as needed. Take 1 tablet by mouth as needed for migraine and may take 1 additional tablet in 2 hours if no relief 04/22/17   Medina-Vargas, Monina C, NP  topiramate (TOPAMAX) 100 MG tablet Take 1 tablet (100 mg total) by mouth at bedtime. Start 100 mg qhs after completion of the 75 mg dose on 04/29/17 04/30/17   Medina-Vargas, Monina C, NP  topiramate (TOPAMAX) 25 MG tablet Take 1 tablet (25 mg total) by mouth daily. Take 25 mg tablet along with a 50 mg tablet to = 75 mg qd 04/22/17 03/30/18  Medina-Vargas, Monina C, NP  topiramate (TOPAMAX) 50 MG tablet Take 1 tablet (50 mg total) by mouth daily. Take 50 mg tablet, along with a 25 mg tablet, to = 75 mg qd 04/22/17 04/29/17  Medina-Vargas, Monina C, NP  trazodone (DESYREL) 300 MG tablet Take 1 tablet (300 mg total) by mouth at bedtime as needed for sleep. 04/22/17   Medina-Vargas, Monina C, NP  vitamin B-12 (CYANOCOBALAMIN) 1000 MCG tablet Take 1 tablet  (1,000 mcg total) by mouth daily. 02/19/17   Debbe Odea, MD  vitamin E 200 UNIT capsule Take 200 Units by mouth every evening.    [provider]    Family History Family History  Problem Relation Age of Onset  . Lung cancer Mother 13  . Migraines Mother   . Heart attack Father        Vague  . Hypertension Brother   . Esophageal cancer Brother   . Diabetes Paternal Grandmother        questionable  . Colon cancer Neg Hx   . Kidney disease Neg Hx   . Liver disease Neg Hx     Social History Social History  Substance Use Topics  . Smoking status: Current Some Day Smoker    Packs/day: 1.00    Years: 34.00    Types: Cigarettes    Last attempt to quit: 02/03/2017  . Smokeless tobacco: Never Used     Comment: 10 per week or less  . Alcohol use 0.6 oz/week    1 Standard drinks or equivalent per week     Comment: Denies 08/24/16     Allergies   Penicillins   Review of Systems Review of Systems  Constitutional: Negative for appetite change, chills and fever.  HENT: Negative for ear pain, rhinorrhea, sneezing and sore throat.   Eyes: Negative for photophobia and visual disturbance.  Respiratory: Negative for cough, chest tightness, shortness of breath and wheezing.   Cardiovascular: Positive for chest pain. Negative for palpitations.  Gastrointestinal: Negative for abdominal pain, blood in stool, constipation, diarrhea, nausea and vomiting.  Genitourinary: Negative for dysuria, hematuria and urgency.  Musculoskeletal: Negative for myalgias.  Skin: Negative for rash.  Neurological: Negative for dizziness, weakness and light-headedness.  Psychiatric/Behavioral: Positive for hallucinations. The patient is nervous/anxious.      Physical Exam Updated Vital Signs BP (!) 125/54 (BP Location: Left Arm)   Pulse 86   Temp 98.6 F (37 C) (Oral)   Resp 16   Ht 5\' 5"  (1.651 m)   Wt 56.7 kg (125 lb)   SpO2 99%   BMI 20.80 kg/m   Physical Exam  Constitutional: She  is oriented to person, place, and time. She appears well-developed and well-nourished. No distress.  Nontoxic appearing and in no acute distress.  HENT:  Head: Normocephalic and atraumatic.  Nose: Nose normal.  Eyes: Conjunctivae and EOM are normal. Right eye exhibits no discharge. Left eye exhibits no discharge. No scleral icterus.  Neck: Normal range of motion. Neck supple.  Cardiovascular: Normal rate, regular rhythm, normal heart sounds and intact distal pulses.  Exam reveals no gallop and no friction rub.   No murmur heard. Pulmonary/Chest: Effort normal and breath sounds normal. No respiratory distress.  Abdominal: Soft. Bowel sounds are normal. She exhibits no distension. There is no tenderness. There is no guarding.  Musculoskeletal: Normal range of motion. She exhibits no edema.  No bilateral lower extremity edema or calf tenderness present.  Neurological: She is alert and oriented to person, place, and time. No cranial nerve deficit or sensory deficit. She exhibits normal muscle tone. Coordination normal.  Alert and oriented to person, place, time, situation and current events. Pupils reactive. No facial asymmetry noted. Cranial nerves appear grossly intact. Sensation intact to light touch on face, BUE and BLE. Strength 5/5 in BUE and BLE. Normal finger to nose coordination.  Skin: Skin is warm and dry. No rash noted.  Psychiatric: She has a normal mood and affect.  Nursing note and vitals reviewed.    ED Treatments / Results  Labs (all labs ordered are listed, but only abnormal results are displayed) Labs Reviewed  TROPONIN I  D-DIMER, QUANTITATIVE (NOT AT [facility])  ETHANOL  RAPID URINE DRUG SCREEN, HOSP PERFORMED  BASIC METABOLIC PANEL  CBC  URINALYSIS, ROUTINE W REFLEX MICROSCOPIC  I-STAT TROPONIN, ED    EKG  EKG Interpretation  Date/Time:  Monday May 18 2017 04:36:49 EDT Ventricular Rate:  83 PR Interval:    QRS Duration: 93 QT Interval:  396 QTC  Calculation: 466 R Axis:   40 Text Interpretation:  Sinus rhythm T wave changes improved Confirmed by Ezequiel Essex [phone]) on 05/18/2017 4:44:23 AM       Radiology No results found.  Procedures Procedures (including critical care time)  Medications Ordered in ED Medications - No data to display   Initial Impression / Assessment and Plan / ED Course  I have reviewed the triage vital signs and the nursing notes.  Pertinent labs & imaging results that were available during my care of the patient were reviewed by me and considered in my medical decision making (see chart for details).     Patient presents to ED for evaluation of 1 day history of left upper chest pain. States that the pain is intermittent and lasts for a few seconds at a time. Describes it as a pressure sensation. She does have a history of similar symptoms in the past. She denies any shortness of breath, hemoptysis, fevers, chills, URI symptoms, vomiting or abdominal pain. She also complains of intermittent visual hallucinations. Unsure if this is related to her new pain medication that she is taking for the past 2 days (Opana) after her hip replacement surgery 1 month ago. Physical exam patient is nontoxic appearing and in no acute distress. She is alert and oriented 4. Cranial nerves appear grossly intact. No deficits noted on neurological exam. She is afebrile with no history of fever. Vital signs within normal limits. EKG with improvement in T-wave inversion seen in previous tracings. Troponin negative 1. Chest x-ray unremarkable. CBC unremarkable. D-dimer elevated, which was obtained due to patient's recent hip replacement surgery. Will obtain CTA, delta troponin and TTS consult pending medical clearance. Additional labs and CTA  pending during shift change. Will sign out to Ward, PA-C for remainder of workup.  Patient discussed with and seen by Dr. Wyvonnia Dusky.  Final Clinical Impressions(s) / ED Diagnoses   Final  diagnoses:  None    New Prescriptions New Prescriptions   No medications on file     Delia Heady, PA-C 05/18/17 0615    Ezequiel Essex, MD 05/19/17 [phone]

## 2017-05-18 NOTE — ED Notes (Signed)
Breakfast tray ordered 

## 2017-05-18 NOTE — ED Provider Notes (Signed)
Care assumed from previous provider PA [place]. Please see note for further details. Case discussed, plan agreed upon. Briefly, patient is a 66 y.o. who presents to ED for chest pain as well as visual hallucinations. Will follow up on pending CT angio, obtain 2nd troponin. If negative, medically cleared and will consult TTS.   CT angio with no PE or dissection. No acute, emergent findings. 2nd troponin negative.  Discussed reassuring results with patient and encouraged her to follow up with PCP.   Patient medically cleared, TTS consulted. Disposition per TTS.    Tran Randle, Ozella Almond, PA-C 05/18/17 [place], MD 05/19/17 [phone]

## 2017-05-18 NOTE — ED Triage Notes (Signed)
[facility] reports the pt got in her vehicle and flagged down police who in turn called EMS. Pt states she has been having hallucination for the past 2 days. Pt states she sees young boys. Pt states she has a history of anxiety. Pt also reports that her chest pain started this evening and it comes and goes.

## 2017-05-18 NOTE — Patient Outreach (Addendum)
[facility]) Care Management  05/18/2017  Dorothy White Mar 31, 1951 [id number]  RN Health Coach spoke with the patient after she called  back.  HIPAA verified and [facility] services explained to the patient.  After starting the screening process with the patient I inquired  does she live alone.  She then stated she does but she was not in [place]. After confirming that she lived in [place] and I stated that I was also located in [place] the patient hung up the phone.  I tried to call the patient back and was unable to reach her.  Plan: RN Health Coach will make outreach attempt to patient within in one business days.  Lazaro Arms RN, BSN, [place] Direct Dial:  [phone] Fax: [phone]

## 2017-05-18 NOTE — ED Notes (Signed)
Patient sleeping when this RN entered.  Able to rouse easily.  Calm and cooperative and polite.  Patient denies auditory or visual hallucinations at this time or since the last person asked.

## 2017-05-18 NOTE — ED Notes (Signed)
Pt requested Trazodone for sleep.

## 2017-05-18 NOTE — Care Management (Signed)
ED NCM reviewed patient's record noted patient is followed by [facility] Management Team. Patient has had 11 ED visits in the past 6 months, patient presented to [facility] ED with CP and visual hallucinations. TTS consult pending.  CM attempted to contact [place]  RN CM [phone]. CM will continue to  follow up

## 2017-05-18 NOTE — ED Notes (Signed)
Nurse will draw labs when start IV 

## 2017-05-19 ENCOUNTER — Other Ambulatory Visit: Payer: Self-pay

## 2017-05-19 ENCOUNTER — Encounter (HOSPITAL_COMMUNITY): Payer: Self-pay | Admitting: Emergency Medicine

## 2017-05-19 DIAGNOSIS — F17210 Nicotine dependence, cigarettes, uncomplicated: Secondary | ICD-10-CM | POA: Diagnosis not present

## 2017-05-19 DIAGNOSIS — F419 Anxiety disorder, unspecified: Secondary | ICD-10-CM

## 2017-05-19 DIAGNOSIS — R440 Auditory hallucinations: Secondary | ICD-10-CM | POA: Diagnosis not present

## 2017-05-19 DIAGNOSIS — R079 Chest pain, unspecified: Secondary | ICD-10-CM | POA: Diagnosis not present

## 2017-05-19 DIAGNOSIS — R441 Visual hallucinations: Secondary | ICD-10-CM

## 2017-05-19 DIAGNOSIS — F209 Schizophrenia, unspecified: Secondary | ICD-10-CM

## 2017-05-19 DIAGNOSIS — R450 Nervousness: Secondary | ICD-10-CM | POA: Diagnosis not present

## 2017-05-19 DIAGNOSIS — F29 Unspecified psychosis not due to a substance or known physiological condition: Secondary | ICD-10-CM

## 2017-05-19 NOTE — ED Notes (Signed)
Patient was given Dorothy White, and A Regular Diet was taken.

## 2017-05-19 NOTE — Consult Note (Signed)
Telepsych Consultation   Reason for Consult: Hallucination Referring Physician: Ward, Ozella Almond, PA-C Location of Patient: [facility] Location of Provider: [facility]  Patient Identification: Dorothy White MRN:  [id number] Principal Diagnosis: <principal problem not specified> Diagnosis:   Patient Active Problem List   Diagnosis Date Noted  . Recurrent falls [R29.6] 04/16/2017  . History of total hip replacement, right [Z96.641] 04/16/2017  . At risk for adverse drug event [Z91.89] 04/07/2017  . Acute blood loss as cause of postoperative anemia [D62] 04/04/2017  . Acute delirium [R41.0] 04/04/2017  . Osteoporosis [M81.0] 04/02/2017  . Closed fracture of neck of left femur ([place]) [S72.002A] 03/31/2017  . Scalp contusion [S00.03XA] 03/31/2017  . Fall at home, initial encounter [W19.Merril Abbe, Y86.578] 03/31/2017  . Hip fracture ([place]) [S72.009A] 03/31/2017  . 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 Hx of transient ischemic attack (TIA) [Z86.73] 02/19/2017  . Left carotid stenosis- 40-59% [I65.22] 02/19/2017  . Malnutrition of moderate degree [E44.0] 02/18/2017  . Falls [phone]] 02/17/2017  . Chest pain [R07.9] 09/26/2016  . Diverticulitis of sigmoid colon [K57.32] 04/25/2016  . Infection due to ESBL-producing Escherichia coli/Diverticular Abscess [A49.8, Z16.12] 03/15/2016  . Diverticulitis [K57.92] 03/12/2016  . Chronic pain syndrome [G89.4] 03/12/2016  . Lesion of pancreas [K86.9] 03/12/2016  . History of chest pain [Z87.898] 03/12/2016  . Hypokalemia [E87.6] 03/12/2016  . Angina, class IV ([place]) - chest tightness and pressure with dyspnea with minimal exertion [I20.9] 02/17/2016    Class: Question of  . TIA (transient ischemic attack) [G45.9] 12/26/2015  . DOE (dyspnea on exertion) [R06.09] 12/26/2015  . Right sided weakness [R53.1] 12/17/2015  . Ataxia [R27.0] 12/17/2015  . Shoulder fracture [S42.90XA] 02/15/2015  . Chronic headache [R51] 10/11/2013  .  Weakness generalized [R53.1] 08/12/2013  . Dizziness and giddiness [R42] 08/12/2013  . Abnormality of gait [R26.9] 07/11/2013  . Hyperlipidemia [E78.5] 05/07/2007  . Migraine without aura [G43.009] 05/07/2007  . PREMATURE VENTRICULAR CONTRACTIONS, FREQUENT [I49.49] 05/07/2007  . GERD [K21.9] 05/07/2007  . DIVERTICULOSIS, COLON [K57.30] 05/07/2007  . 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 OSTEOPENIA [M89.9, M94.9] 05/07/2007  . Anxiety and depression [F41.9, F32.9] 03/24/2007  . HEMORRHOIDS [K64.9] 03/24/2007  . ALLERGIC RHINITIS [J30.9] 03/24/2007  . LOW BACK PAIN [M54.5] 03/24/2007  . MIGRAINES, HX OF [Z87.898] 03/24/2007    Total Time spent with patient: 30 minutes  Subjective:   Dorothy White is a 66 y.o. female patient admitted with Unspecified schizophrenia spectrum and other psychotic disorder.  HPI: Per the TTS assessment completed on 05/18/17 by Delvondria Dubose: Dorothy White is an 66 y.o. female present to [facility] ED for chest pain and auditory / visual hallucinations. Patient report she has been experiencing auditory / visual hallucinations for the past two days. Report hearing kids playing in a bedroom of her home. When she enters the bedroom she no longer hears the children playing, however, she sees a child standing in the corner. When she tires to speak to the child patient report the child disappears. Patient report mental health history of depression, prescribed Prozac. Denies current depressive symptoms. Denies other mental health symptoms or diagnosis. Denies substance use or additional stressors in her life besides experiencing chest pain. Within the last two weeks report started medication Oxymorphone. Patient primary doctor, Dr. Arbie Cookey Web prescribes medication. Patient report normal sleep with medication aid and healthy appetite.   Patient denied suicidal or homicidal ideations. Patient presented with pleasant mood, speech, tone, and affect.  Patient report no access to weapons.  Patient lives with a female friend, report she can residents. Patient anxiety level increased as she report she has medical condition which has her nervous. Patient denies history of abuse.  On Exam: Patient was seen, chart reviewed with treatment team. Patient assessed today via telepsych, patient was sitting up in bed awake, alert and oriented 4. Patient stated the reason for this hospitalization as documented above. Patient said, "I came to the hospital because I was having hallucination. I saw a boy in the kitchen and I asked him what what he's doing and when I turned around, he disappear. This happened twice on Saturday". Patient stated that she is doing much better today. Her mood and affect are both improved. Patient stating that the only new thing she had on Saturday was her Oxymorphone medication which she believes may have contributed to this hallucinations. Patient stating that she had a hip replacement last month and went to her PCP to discuss longer acting pain control medication so she started her on the Oxymorphone. Patient currently denies any suicide or homicide ideation. She has not had any hallucination again since she's been in the hospital. Patient said she is ready to go home and get back to her life. Patient states her brother who lives in [place] is her support system. Patient lives alone but have a landlord who lives in the same building. Patient is willing to follow-up with outpatient provider for psychiatric evaluation and medication management. In the meantime, patient will no longer use the oxymorphone for her pain control.  Past Psychiatric History: As in H&P  Risk to Self: Suicidal Ideation: No-Not Currently/Within Last 6 Months Suicidal Intent: No Is patient at risk for suicide?: No Suicidal Plan?: No Access to Means: No What has been your use of drugs/alcohol within the last 12 months?: none reported How many times?: 0 Other Self Harm  Risks: none reported Triggers for Past Attempts: None known Intentional Self Injurious Behavior: None Risk to Others: Homicidal Ideation: No Thoughts of Harm to Others: No Current Homicidal Intent: No Current Homicidal Plan: No Access to Homicidal Means: No Identified Victim: n/a History of harm to others?: No Assessment of Violence: None Noted Violent Behavior Description: none noted Does patient have access to weapons?: No Criminal Charges Pending?: No Does patient have a court date: No Prior Inpatient Therapy: Prior Inpatient Therapy: No Prior Outpatient Therapy: Prior Outpatient Therapy: No  Past Medical History:  Past Medical History:  Diagnosis Date  . Anemia    ?  Marland Kitchen Anxiety   . Arthritis    "everywhere" (04/25/2016)  . 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 Chronic back pain    "all over" (04/25/2016)  . Constipation   . Depression   . Diverticulitis   . GERD (gastroesophageal reflux disease)   . Headache    "weekly" (04/25/2016)  . 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 HLD (hyperlipidemia)    hx (04/25/2016)  . 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 Mild CAD    a. 25% mLAD, otherwise no sig disease 01/2016 cath.  Marland Kitchen NICM (nonischemic cardiomyopathy) ([place])    a. EF 40-45% by echo 02/3418 at time of complicated migraine/neuro sx, 55-65% at time of cath 01/2016  . Stroke [facility]) 06/2013   "mini" stroke , ?possibly hemaplegic migraine per pt    Past Surgical History:  Procedure Laterality Date  . ANTERIOR APPROACH HEMI HIP ARTHROPLASTY Left 04/01/2017   Procedure: LEFT DIRECT  ANTERIOR TOTAL HIP REPLACEMENT;  Surgeon: Leandrew Koyanagi, MD;  Location: [place];  Service: Orthopedics;  Laterality: Left;  LEFT DIRECT ANTERIOR TOTAL HIP REPLACEMENT  . AUGMENTATION MAMMAPLASTY  1980  . BASAL CELL CARCINOMA EXCISION     "tip of my nose"  . BUNIONECTOMY Bilateral 10/2003  . CARDIAC CATHETERIZATION N/A 02/20/2016   Procedure:  Left Heart Cath and Coronary Angiography;  Surgeon: Jettie Booze, MD;  Location: [place] [facility];  Service: Cardiovascular;  Laterality: N/A;  . COLONOSCOPY    . DILATION AND CURETTAGE OF UTERUS    . FRACTURE SURGERY    . IR GENERIC HISTORICAL  03/18/2016   IR SINUS/FIST TUBE CHK-NON GI 03/18/2016 Aletta Edouard, MD [facility]-INTERV RAD  . LAPAROSCOPIC SIGMOID COLECTOMY N/A 04/25/2016   Procedure: LAPAROSCOPIC SIGMOID COLECTOMY;  Surgeon: Stark Klein, MD;  Location: [place];  Service: General;  Laterality: N/A;  . OOPHORECTOMY Bilateral ~ 1999  . ORIF HUMERUS FRACTURE Right 02/15/2015   Procedure: OPEN REDUCTION INTERNAL FIXATION (ORIF) PROXIMAL HUMERUS FRACTURE;  Surgeon: Netta Cedars, MD;  Location: [place];  Service: Orthopedics;  Laterality: Right;  . RHINOPLASTY  1976  . TONSILLECTOMY    . TUBAL LIGATION  ~ 1983  . VAGINAL HYSTERECTOMY  ~ 1998   Family History:  Family History  Problem Relation Age of Onset  . Lung cancer Mother 1  . Migraines Mother   . Heart attack Father        Vague  . Hypertension Brother   . Esophageal cancer Brother   . Diabetes Paternal Grandmother        questionable  . Colon cancer Neg Hx   . Kidney disease Neg Hx   . Liver disease Neg Hx    Family Psychiatric  History: As in H&P  Social History:  History  Alcohol Use  . 0.6 oz/week  . 1 Standard drinks or equivalent per week    Comment: Denies 08/24/16     History  Drug Use No    Comment: Denies 08/24/16    Social History   Social History  . Marital status: Divorced    Spouse name: N/A  . Number of children: 0  . Years of education: 12   Occupational History  . retired Financial trader    Retired  .  [place] History Main Topics  . Smoking status: Current Some Day Smoker    Packs/day: 1.00    Years: 34.00    Types: Cigarettes    Last attempt to quit: 02/03/2017  . Smokeless tobacco: Never Used     Comment: 10 per week or less  . Alcohol use 0.6 oz/week    1  Standard drinks or equivalent per week     Comment: Denies 08/24/16  . Drug use: No     Comment: Denies 08/24/16  . Sexual activity: No   Other Topics Concern  . None   Social History Narrative   Patient lives at home alone.    Patient is retired.   Education- High school and some college   Right handed.   Patient drinks one cup of coffee and a big glass of ice tea.   Additional Social History:    Allergies:   Allergies  Allergen Reactions  . Penicillins Hives    Has patient had a PCN reaction causing immediate rash, facial/tongue/throat swelling, SOB or lightheadedness with hypotension: Yes Has patient had a PCN reaction causing severe rash involving mucus membranes or  skin necrosis: No Has patient had a PCN reaction that required hospitalization No Has patient had a PCN reaction occurring within the last 10 years: No If all of the above answers are "NO", then may proceed with Cephalosporin use.     Labs:  Results for orders placed or performed during the hospital encounter of 05/18/17 (from the past 48 hour(s))  Troponin I     Status: None   Collection Time: 05/18/17  5:04 AM  Result Value Ref Range   Troponin I <0.03 <0.03 ng/mL  D-dimer, quantitative (not at [facility])     Status: Abnormal   Collection Time: 05/18/17  5:04 AM  Result Value Ref Range   D-Dimer, Quant 1.32 (H) 0.00 - 0.50 ug/mL-FEU    Comment: (NOTE) At the manufacturer cut-off of 0.50 ug/mL FEU, this assay has been documented to exclude PE with a sensitivity and negative predictive value of 97 to 99%.  At this time, this assay has not been approved by the FDA to exclude DVT/VTE. Results should be correlated with clinical presentation.   Ethanol     Status: None   Collection Time: 05/18/17  5:04 AM  Result Value Ref Range   Alcohol, Ethyl (B) <5 <10 mg/dL    Comment:        LOWEST DETECTABLE LIMIT FOR SERUM ALCOHOL IS 10 mg/dL FOR MEDICAL PURPOSES ONLY   Basic metabolic panel     Status: Abnormal    Collection Time: 05/18/17  5:04 AM  Result Value Ref Range   Sodium 139 135 - 145 mmol/L   Potassium 3.8 3.5 - 5.1 mmol/L   Chloride 105 101 - 111 mmol/L   CO2 25 22 - 32 mmol/L   Glucose, Bld 124 (H) 65 - 99 mg/dL   BUN 17 6 - 20 mg/dL   Creatinine, Ser 0.75 0.44 - 1.00 mg/dL   Calcium 9.4 8.9 - 10.3 mg/dL   GFR calc non Af Amer >60 >60 mL/min   GFR calc Af Amer >60 >60 mL/min    Comment: (NOTE) The eGFR has been calculated using the CKD EPI equation. This calculation has not been validated in all clinical situations. eGFR's persistently <60 mL/min signify possible Chronic Kidney Disease.    Anion gap 9 5 - 15  CBC     Status: Abnormal   Collection Time: 05/18/17  5:04 AM  Result Value Ref Range   WBC 6.3 4.0 - 10.5 K/uL   RBC 3.80 (L) 3.87 - 5.11 MIL/uL   Hemoglobin 10.5 (L) 12.0 - 15.0 g/dL   HCT 33.2 (L) 36.0 - 46.0 %   MCV 87.4 78.0 - 100.0 fL   MCH 27.6 26.0 - 34.0 pg   MCHC 31.6 30.0 - 36.0 g/dL   RDW 13.7 11.5 - 15.5 %   Platelets 181 150 - 400 K/uL  I-stat troponin, ED     Status: None   Collection Time: 05/18/17  5:24 AM  Result Value Ref Range   Troponin i, poc 0.00 0.00 - 0.08 ng/mL   Comment 3            Comment: Due to the release kinetics of cTnI, a negative result within the first hours of the onset of symptoms does not rule out myocardial infarction with certainty. If myocardial infarction is still suspected, repeat the test at appropriate intervals.   Rapid urine drug screen (hospital performed)     Status: Abnormal   Collection Time: 05/18/17  5:53 AM  Result Value Ref  Range   Opiates POSITIVE (A) NONE DETECTED   Cocaine NONE DETECTED NONE DETECTED   Benzodiazepines NONE DETECTED NONE DETECTED   Amphetamines NONE DETECTED NONE DETECTED   Tetrahydrocannabinol NONE DETECTED NONE DETECTED   Barbiturates NONE DETECTED NONE DETECTED    Comment:        DRUG SCREEN FOR MEDICAL PURPOSES ONLY.  IF CONFIRMATION IS NEEDED FOR ANY PURPOSE, NOTIFY  LAB WITHIN 5 DAYS.        LOWEST DETECTABLE LIMITS FOR URINE DRUG SCREEN Drug Class       Cutoff (ng/mL) Amphetamine      1000 Barbiturate      200 Benzodiazepine   458 Tricyclics       099 Opiates          300 Cocaine          300 THC              50   Urinalysis, Routine w reflex microscopic     Status: Abnormal   Collection Time: 05/18/17  5:53 AM  Result Value Ref Range   Color, Urine YELLOW YELLOW   APPearance CLEAR CLEAR   Specific Gravity, Urine 1.018 1.005 - 1.030   pH 6.0 5.0 - 8.0   Glucose, UA NEGATIVE NEGATIVE mg/dL   Hgb urine dipstick NEGATIVE NEGATIVE   Bilirubin Urine NEGATIVE NEGATIVE   Ketones, ur 5 (A) NEGATIVE mg/dL   Protein, ur 30 (A) NEGATIVE mg/dL   Nitrite NEGATIVE NEGATIVE   Leukocytes, UA NEGATIVE NEGATIVE   RBC / HPF NONE SEEN 0 - 5 RBC/hpf   WBC, UA 0-5 0 - 5 WBC/hpf   Bacteria, UA NONE SEEN NONE SEEN   Squamous Epithelial / LPF 0-5 (A) NONE SEEN   Mucus PRESENT    Hyaline Casts, UA PRESENT   I-stat troponin, ED     Status: None   Collection Time: 05/18/17  9:08 AM  Result Value Ref Range   Troponin i, poc 0.00 0.00 - 0.08 ng/mL   Comment 3            Comment: Due to the release kinetics of cTnI, a negative result within the first hours of the onset of symptoms does not rule out myocardial infarction with certainty. If myocardial infarction is still suspected, repeat the test at appropriate intervals.     Medications:  Current Facility-Administered Medications  Medication Dose Route Frequency Provider Last Rate Last Dose  . acetaminophen (TYLENOL) tablet 650 mg  650 mg Oral Q6H PRN Ward, Ozella Almond, PA-C   650 mg at 05/18/17 1938  . clonazePAM (KLONOPIN) tablet 1 mg  1 mg Oral BID PRN Ward, Ozella Almond, PA-C   1 mg at 05/18/17 1104  . eletriptan (RELPAX) tablet 40 mg  40 mg Oral Q2H PRN Ward, Ozella Almond, PA-C      . FLUoxetine (PROZAC) capsule 60 mg  60 mg Oral BH-q7a Ward, Ozella Almond, PA-C   60 mg at 05/19/17 8338  .  fluticasone (FLONASE) 50 MCG/ACT nasal spray 1 spray  1 spray Each Nare Daily Ward, Ozella Almond, PA-C   1 spray at 05/19/17 [phone]  . ondansetron (ZOFRAN) tablet 8 mg  8 mg Oral Q8H PRN Ward, Ozella Almond, PA-C      . topiramate (TOPAMAX) tablet 100 mg  100 mg Oral QHS Ward, Ozella Almond, PA-C   100 mg at 05/18/17 2219  . traZODone (DESYREL) tablet 300 mg  300 mg Oral QHS PRN Ward, Ozella Almond, PA-C  300 mg at 05/18/17 2220  . vitamin B-12 (CYANOCOBALAMIN) tablet 1,000 mcg  1,000 mcg Oral Daily Ward, Chase Picket, PA-C   1,000 mcg at 05/19/17 1035  . vitamin E capsule 200 Units  200 Units Oral QPM Ward, Chase Picket, PA-C   200 Units at 05/18/17 4563   Current Outpatient Prescriptions  Medication Sig Dispense Refill  . acetaminophen (TYLENOL) 325 MG tablet Take 650 mg by mouth every 6 (six) hours as needed.    . clonazePAM (KLONOPIN) 1 MG tablet Take 1 mg by mouth daily as needed for anxiety.    Marland Kitchen FLUoxetine (PROZAC) 20 MG tablet Take 3 tablets (60 mg total) by mouth every morning. Take 3 tablets to = 60 mg qam 90 tablet 0  . ibuprofen (ADVIL,MOTRIN) 600 MG tablet Take 1 tablet (600 mg total) by mouth every 6 (six) hours as needed. 30 tablet 0  . LUTEIN PO Take 1 capsule by mouth 2 (two) times daily.     . mometasone (NASONEX) 50 MCG/ACT nasal spray Place 2 sprays into the nose daily. 17 g 0  . ondansetron (ZOFRAN) 8 MG tablet Take 1 tablet (8 mg total) by mouth every 8 (eight) hours as needed for nausea or vomiting. 30 tablet 0  . RELPAX 40 MG tablet Take 1 tablet (40 mg total) by mouth every 2 (two) hours as needed. Take 1 tablet by mouth as needed for migraine and may take 1 additional tablet in 2 hours if no relief 30 tablet 0  . topiramate (TOPAMAX) 100 MG tablet Take 1 tablet (100 mg total) by mouth at bedtime. Start 100 mg qhs after completion of the 75 mg dose on 04/29/17 (Patient taking differently: Take 100 mg by mouth at bedtime. ) 30 tablet 0  . trazodone (DESYREL) 300 MG tablet  Take 1 tablet (300 mg total) by mouth at bedtime as needed for sleep. 30 tablet 0  . vitamin B-12 (CYANOCOBALAMIN) 1000 MCG tablet Take 1 tablet (1,000 mcg total) by mouth daily. 30 tablet 0  . vitamin E 200 UNIT capsule Take 200 Units by mouth every evening.    . feeding supplement, ENSURE ENLIVE, (ENSURE ENLIVE) LIQD Take 237 mLs by mouth 2 (two) times daily between meals. (Patient not taking: Reported on 05/18/2017) 237 mL 12  . gabapentin (NEURONTIN) 100 MG capsule Take 1 capsule (100 mg total) by mouth at bedtime. (Patient not taking: Reported on 05/18/2017) 30 capsule 0  . HYDROcodone-acetaminophen (NORCO) 7.5-325 MG tablet 1-2 tabs po daily prn pain (Patient not taking: Reported on 05/18/2017) 30 tablet 0  . topiramate (TOPAMAX) 25 MG tablet Take 1 tablet (25 mg total) by mouth daily. Take 25 mg tablet along with a 50 mg tablet to = 75 mg qd (Patient not taking: Reported on 05/18/2017) 7 tablet 0  . topiramate (TOPAMAX) 50 MG tablet Take 1 tablet (50 mg total) by mouth daily. Take 50 mg tablet, along with a 25 mg tablet, to = 75 mg qd (Patient not taking: Reported on 05/18/2017) 7 tablet 0    Musculoskeletal: UTA via telspsych  Psychiatric Specialty Exam: Physical Exam  Nursing note and vitals reviewed.   Review of Systems  Psychiatric/Behavioral: Positive for hallucinations. Negative for depression, memory loss, substance abuse and suicidal ideas. The patient is nervous/anxious. The patient does not have insomnia.   All other systems reviewed and are negative.   Blood pressure (!) 127/51, pulse 71, temperature 98.7 F (37.1 C), temperature source Oral, resp. rate 16, height 5'  5" (1.651 m), weight 56.7 kg (125 lb), SpO2 97 %.Body mass index is 20.8 kg/m.  General Appearance: appropriate  Eye Contact:  Good  Speech:  Clear and Coherent and Normal Rate  Volume:  Normal  Mood:  Euthymic  Affect:  Appropriate and Congruent  Thought Process:  Coherent and Goal Directed  Orientation:   Full (Time, Place, and Person)  Thought Content:  WDL and Logical  Suicidal Thoughts:  No  Homicidal Thoughts:  No  Memory:  Immediate;   Good Recent;   Good Remote;   Fair  Judgement:  Good  Insight:  Good  Psychomotor Activity:  Normal  Concentration:  Concentration: Good and Attention Span: Good  Recall:  Good  Fund of Knowledge:  Good  Language:  Good  Akathisia:  Negative  Handed:  Right  AIMS (if indicated):     Assets:  Communication Skills Desire for Improvement Financial Resources/Insurance Housing Leisure Time Physical Health Social Support  ADL's:  Intact  Cognition:  WNL  Sleep:       Treatment Plan Summary: Plan to discharge patient with OP provider and therapy resources.  Disposition: No evidence of imminent risk to self or others at present.   Patient does not meet criteria for psychiatric inpatient admission. Supportive therapy provided about ongoing stressors. Discussed crisis plan, support from social network, calling 911, coming to the Emergency Department, and calling Suicide Hotline.  This service was provided via telemedicine using a 2-way, interactive audio and video technology.  Names of all persons participating in this telemedicine service and their role in this encounter. Name: Koreena Joost. [place] Role: Patient  Name: Justina A. Okonkwo  Role: NP           Vicenta Aly, NP 05/19/2017 11:33 AM

## 2017-05-19 NOTE — ED Provider Notes (Signed)
Patient has been reevaluated by psychiatry and deemed safe for discharge home.  Patient denies any current anxiety or hallucinations.  Never had suicidal or homicidal thoughts.  She thinks the hallucinations were from a side effect of her new narcotic pain medicine which she wants to stop.  Agrees to follow-up with PCP.  Has had some atypical chest pain but this is also resolved and has had multiple negative troponins.  Negative CT for pulmonary embolism.  Discharge with return precautions.   Sherwood Gambler, MD 05/19/17 [phone]

## 2017-05-19 NOTE — ED Notes (Signed)
ED Provider at bedside. 

## 2017-05-19 NOTE — ED Notes (Signed)
TTS present at bedside.  

## 2017-05-19 NOTE — Progress Notes (Signed)
Per Hughie Closs, NP, the patient does not meet criteria for inpatient treatment. The patient is recommended for discharge and to follow up with an outpatient provider.   Patient psychiatrically cleared.   Disposition CSW will fax outpatient resources to [facility] ED at [phone].    Demetrius Revel, RN notified.    Radonna Ricker MSW, [place] Disposition [phone]

## 2017-05-19 NOTE — Patient Outreach (Signed)
[facility]) Care Management  05/19/2017  Dorothy White 1950-09-14 [id number]   3rd telephone call to patient for ED Utilization screening.  No answer.  HIPAA complaint voice message left.     Plan: RN Health Coach will send letter to attempt outreach.  If no response within ten business days will proceed with case closure.     Lazaro Arms RN, BSN, [place] Direct Dial:  [phone] Fax: [phone]

## 2017-05-19 NOTE — Progress Notes (Signed)
Outpatient resources faxed to [facility] at [phone].    Radonna Ricker MSW, [place] Disposition [phone]

## 2017-05-21 ENCOUNTER — Other Ambulatory Visit: Payer: Self-pay | Admitting: Adult Health

## 2017-05-21 DIAGNOSIS — G43419 Hemiplegic migraine, intractable, without status migrainosus: Secondary | ICD-10-CM

## 2017-05-21 DIAGNOSIS — G629 Polyneuropathy, unspecified: Secondary | ICD-10-CM

## 2017-05-26 ENCOUNTER — Other Ambulatory Visit: Payer: Self-pay | Admitting: Adult Health

## 2017-05-26 DIAGNOSIS — G43419 Hemiplegic migraine, intractable, without status migrainosus: Secondary | ICD-10-CM

## 2017-05-31 ENCOUNTER — Other Ambulatory Visit: Payer: Self-pay | Admitting: Adult Health

## 2017-05-31 DIAGNOSIS — G43419 Hemiplegic migraine, intractable, without status migrainosus: Secondary | ICD-10-CM

## 2017-06-01 ENCOUNTER — Telehealth: Payer: Self-pay | Admitting: Neurology

## 2017-06-01 ENCOUNTER — Ambulatory Visit: Payer: PPO | Admitting: Neurology

## 2017-06-01 NOTE — Telephone Encounter (Signed)
This patient did not show for a revisit appointment today, canceled same day of the appointment, claimed to be sick.

## 2017-06-02 ENCOUNTER — Encounter: Payer: Self-pay | Admitting: Neurology

## 2017-06-12 ENCOUNTER — Other Ambulatory Visit: Payer: Self-pay | Admitting: Neurology

## 2017-06-12 DIAGNOSIS — G43419 Hemiplegic migraine, intractable, without status migrainosus: Secondary | ICD-10-CM

## 2017-06-22 DIAGNOSIS — T50905A Adverse effect of unspecified drugs, medicaments and biological substances, initial encounter: Secondary | ICD-10-CM | POA: Diagnosis not present

## 2017-06-22 DIAGNOSIS — Z23 Encounter for immunization: Secondary | ICD-10-CM | POA: Diagnosis not present

## 2017-06-22 DIAGNOSIS — R443 Hallucinations, unspecified: Secondary | ICD-10-CM | POA: Diagnosis not present

## 2017-06-29 ENCOUNTER — Emergency Department (HOSPITAL_COMMUNITY)
Admission: EM | Admit: 2017-06-29 | Discharge: 2017-06-29 | Disposition: A | Discharge status: Home / Self Care | Payer: PPO | Attending: Emergency Medicine | Admitting: Emergency Medicine

## 2017-06-29 ENCOUNTER — Emergency Department (HOSPITAL_COMMUNITY): Payer: PPO

## 2017-06-29 ENCOUNTER — Encounter (HOSPITAL_COMMUNITY): Payer: Self-pay | Admitting: Emergency Medicine

## 2017-06-29 DIAGNOSIS — R404 Transient alteration of awareness: Secondary | ICD-10-CM | POA: Diagnosis not present

## 2017-06-29 DIAGNOSIS — Z96642 Presence of left artificial hip joint: Secondary | ICD-10-CM | POA: Insufficient documentation

## 2017-06-29 DIAGNOSIS — Z79899 Other long term (current) drug therapy: Secondary | ICD-10-CM | POA: Diagnosis not present

## 2017-06-29 DIAGNOSIS — Z85828 Personal history of other malignant neoplasm of skin: Secondary | ICD-10-CM | POA: Insufficient documentation

## 2017-06-29 DIAGNOSIS — R55 Syncope and collapse: Secondary | ICD-10-CM | POA: Insufficient documentation

## 2017-06-29 DIAGNOSIS — I2510 Atherosclerotic heart disease of native coronary artery without angina pectoris: Secondary | ICD-10-CM | POA: Diagnosis not present

## 2017-06-29 DIAGNOSIS — F17210 Nicotine dependence, cigarettes, uncomplicated: Secondary | ICD-10-CM | POA: Diagnosis not present

## 2017-06-29 DIAGNOSIS — R0602 Shortness of breath: Secondary | ICD-10-CM | POA: Insufficient documentation

## 2017-06-29 DIAGNOSIS — Z7982 Long term (current) use of aspirin: Secondary | ICD-10-CM | POA: Diagnosis not present

## 2017-06-29 DIAGNOSIS — G43009 Migraine without aura, not intractable, without status migrainosus: Secondary | ICD-10-CM | POA: Insufficient documentation

## 2017-06-29 DIAGNOSIS — R531 Weakness: Secondary | ICD-10-CM | POA: Diagnosis not present

## 2017-06-29 DIAGNOSIS — R42 Dizziness and giddiness: Secondary | ICD-10-CM | POA: Diagnosis not present

## 2017-06-29 DIAGNOSIS — I6522 Occlusion and stenosis of left carotid artery: Secondary | ICD-10-CM | POA: Insufficient documentation

## 2017-06-29 DIAGNOSIS — Z8673 Personal history of transient ischemic attack (TIA), and cerebral infarction without residual deficits: Secondary | ICD-10-CM | POA: Insufficient documentation

## 2017-06-29 LAB — CBC
HEMATOCRIT: 40.2 % (ref 36.0–46.0)
HEMOGLOBIN: 13.8 g/dL (ref 12.0–15.0)
MCH: 28.5 pg (ref 26.0–34.0)
MCHC: 34.3 g/dL (ref 30.0–36.0)
MCV: 83.1 fL (ref 78.0–100.0)
Platelets: 196 10*3/uL (ref 150–400)
RBC: 4.84 MIL/uL (ref 3.87–5.11)
RDW: 13.5 % (ref 11.5–15.5)
WBC: 8.3 10*3/uL (ref 4.0–10.5)

## 2017-06-29 LAB — I-STAT CHEM 8, ED
BUN: 18 mg/dL (ref 6–20)
CHLORIDE: 107 mmol/L (ref 101–111)
CREATININE: 0.8 mg/dL (ref 0.44–1.00)
Calcium, Ion: 1.23 mmol/L (ref 1.15–1.40)
Glucose, Bld: 96 mg/dL (ref 65–99)
HEMATOCRIT: 40 % (ref 36.0–46.0)
HEMOGLOBIN: 13.6 g/dL (ref 12.0–15.0)
POTASSIUM: 3.6 mmol/L (ref 3.5–5.1)
Sodium: 141 mmol/L (ref 135–145)
TCO2: 20 mmol/L — ABNORMAL LOW (ref 22–32)

## 2017-06-29 LAB — I-STAT TROPONIN, ED: Troponin i, poc: 0 ng/mL (ref 0.00–0.08)

## 2017-06-29 MED ORDER — SODIUM CHLORIDE 0.9 % IV BOLUS (SEPSIS)
1000.0000 mL | Freq: Once | INTRAVENOUS | Status: AC
  Administered 2017-06-29: 1000 mL via INTRAVENOUS

## 2017-06-29 MED ORDER — KETOROLAC TROMETHAMINE 30 MG/ML IJ SOLN
30.0000 mg | Freq: Once | INTRAMUSCULAR | Status: AC
  Administered 2017-06-29: 30 mg via INTRAVENOUS
  Filled 2017-06-29: qty 1

## 2017-06-29 MED ORDER — DIPHENHYDRAMINE HCL 50 MG/ML IJ SOLN
12.5000 mg | Freq: Once | INTRAMUSCULAR | Status: AC
  Administered 2017-06-29: 12.5 mg via INTRAVENOUS
  Filled 2017-06-29: qty 1

## 2017-06-29 MED ORDER — PROCHLORPERAZINE EDISYLATE 5 MG/ML IJ SOLN
10.0000 mg | Freq: Once | INTRAMUSCULAR | Status: AC
  Administered 2017-06-29: 10 mg via INTRAVENOUS
  Filled 2017-06-29: qty 2

## 2017-06-29 NOTE — ED Provider Notes (Signed)
[facility] EMERGENCY DEPARTMENT Provider Note   CSN: [id number] Arrival date & time: 06/29/17  1814     History   Chief Complaint Chief Complaint  Patient presents with  . Shortness of Breath  . Near Syncope    HPI Dorothy White is a 66 y.o. female.  HPI  Patient presents with complaint of generalized weakness with dizziness and near syncope today while at the grocery store.  She states she has felt weak and tired with minimal exertion over the past 2 weeks.  She states she has GERD and has had a decreased appetite.  She does states she has been drinking well.  She was recently started on omeprazole.  She states that when she stands up she feels her heart racing and gets somewhat short of breath when she tries to exert herself.  She denies any chest pain.  She has no leg swelling.  No vomiting or diarrhea.  No hx of DVT/PE, no recent travel trauma or surgery.  There are no other associated systemic symptoms, there are no other alleviating or modifying factors.  Pt also states she now has a headache which feels similar to her prior migraines.    Past Medical History:  Diagnosis Date  . Anemia    ?  Marland Kitchen Anxiety   . Arthritis    "everywhere" (04/25/2016)  . 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 Chronic back pain    "all over" (04/25/2016)  . Constipation   . Depression   . Diverticulitis   . GERD (gastroesophageal reflux disease)   . Headache    "weekly" (04/25/2016)  . 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 HLD (hyperlipidemia)    hx (04/25/2016)  . 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 Mild CAD    a. 25% mLAD, otherwise no sig disease 01/2016 cath.  Marland Kitchen NICM (nonischemic cardiomyopathy) ([place])    a. EF 40-45% by echo 08/6946 at time of complicated migraine/neuro sx, 55-65% at time of cath 01/2016  . Stroke [facility]) 06/2013   "mini" stroke , ?possibly hemaplegic migraine per pt    Patient Active  Problem List   Diagnosis Date Noted  . Recurrent falls 04/16/2017  . 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 At risk for adverse drug event 04/07/2017  . Acute blood loss as cause of postoperative anemia 04/04/2017  . Acute delirium 04/04/2017  . Osteoporosis 04/02/2017  . Closed fracture of neck of left femur ([place]) 03/31/2017  . Scalp contusion 03/31/2017  . Fall at home, initial encounter 03/31/2017  . Hip fracture ([place]) 03/31/2017  . 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 Hx of transient ischemic attack (TIA) 02/19/2017  . Left carotid stenosis- 40-59% 02/19/2017  . Malnutrition of moderate degree 02/18/2017  . Falls 02/17/2017  . Chest pain 09/26/2016  . Diverticulitis of sigmoid colon 04/25/2016  . Infection due to ESBL-producing Escherichia coli/Diverticular Abscess 03/15/2016  . Diverticulitis 03/12/2016  . Chronic pain syndrome 03/12/2016  . Lesion of pancreas 03/12/2016  . History of chest pain 03/12/2016  . Hypokalemia 03/12/2016  . Angina, class IV ([place]) - chest tightness and pressure with dyspnea with minimal exertion 02/17/2016    Class: Question of  . TIA (transient ischemic attack) 12/26/2015  . DOE (dyspnea on exertion) 12/26/2015  . Right sided weakness 12/17/2015  . Ataxia 12/17/2015  . Shoulder fracture 02/15/2015  . Chronic headache 10/11/2013  . Weakness generalized 08/12/2013  .  Dizziness and giddiness 08/12/2013  . Abnormality of gait 07/11/2013  . Hyperlipidemia 05/07/2007  . Migraine without aura 05/07/2007  . PREMATURE VENTRICULAR CONTRACTIONS, FREQUENT 05/07/2007  . GERD 05/07/2007  . DIVERTICULOSIS, COLON 05/07/2007  . 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 OSTEOPENIA 05/07/2007  . Anxiety and depression 03/24/2007  . HEMORRHOIDS 03/24/2007  . ALLERGIC RHINITIS 03/24/2007  . LOW BACK PAIN 03/24/2007  . MIGRAINES, HX OF 03/24/2007    Past Surgical History:  Procedure Laterality Date  . ANTERIOR APPROACH HEMI HIP ARTHROPLASTY  Left 04/01/2017   Procedure: LEFT DIRECT ANTERIOR TOTAL HIP REPLACEMENT;  Surgeon: Leandrew Koyanagi, MD;  Location: [place];  Service: Orthopedics;  Laterality: Left;  LEFT DIRECT ANTERIOR TOTAL HIP REPLACEMENT  . AUGMENTATION MAMMAPLASTY  1980  . BASAL CELL CARCINOMA EXCISION     "tip of my nose"  . BUNIONECTOMY Bilateral 10/2003  . CARDIAC CATHETERIZATION N/A 02/20/2016   Procedure: Left Heart Cath and Coronary Angiography;  Surgeon: Jettie Booze, MD;  Location: [facility] CV LAB;  Service: Cardiovascular;  Laterality: N/A;  . COLONOSCOPY    . DILATION AND CURETTAGE OF UTERUS    . FRACTURE SURGERY    . IR GENERIC HISTORICAL  03/18/2016   IR SINUS/FIST TUBE CHK-NON GI 03/18/2016 Aletta Edouard, MD [facility]-INTERV RAD  . LAPAROSCOPIC SIGMOID COLECTOMY N/A 04/25/2016   Procedure: LAPAROSCOPIC SIGMOID COLECTOMY;  Surgeon: Stark Klein, MD;  Location: [facility];  Service: General;  Laterality: N/A;  . OOPHORECTOMY Bilateral ~ 1999  . ORIF HUMERUS FRACTURE Right 02/15/2015   Procedure: OPEN REDUCTION INTERNAL FIXATION (ORIF) PROXIMAL HUMERUS FRACTURE;  Surgeon: Netta Cedars, MD;  Location: [facility];  Service: Orthopedics;  Laterality: Right;  . RHINOPLASTY  1976  . TONSILLECTOMY    . TUBAL LIGATION  ~ 1983  . VAGINAL HYSTERECTOMY  ~ 1998    OB History    No data available       Home Medications    Prior to Admission medications   Medication Sig Start Date End Date Taking? Authorizing Provider  acetaminophen (TYLENOL) 325 MG tablet Take 975 mg by mouth every 6 (six) hours as needed for headache (pain).    Yes [provider]  aspirin EC 81 MG tablet Take 81 mg by mouth every other day.   Yes [provider]  CALCIUM PO Take 1 tablet by mouth daily with lunch.   Yes [provider]  cholecalciferol (VITAMIN D) 1000 units tablet Take 1,000 Units by mouth daily.   Yes [provider]  clonazePAM (KLONOPIN) 1 MG tablet Take 1 mg by mouth 2 (two) times daily as needed  for anxiety.    Yes [provider]  FLUoxetine (PROZAC) 20 MG tablet Take 3 tablets (60 mg total) by mouth every morning. Take 3 tablets to = 60 mg qam Patient taking differently: Take 60 mg by mouth daily. 3 tablets 04/22/17  Yes Medina-Vargas, Monina C, NP  LUTEIN PO Take 1 capsule by mouth daily.    Yes [provider]  Magnesium 500 MG TABS Take 500 mg by mouth at bedtime.   Yes [provider]  mometasone (NASONEX) 50 MCG/ACT nasal spray Place 2 sprays into the nose daily. 04/22/17  Yes Medina-Vargas, Monina C, NP  omeprazole (PRILOSEC) 40 MG capsule Take 40 mg by mouth daily. 06/22/17  Yes [provider]  promethazine (PHENERGAN) 25 MG tablet Take 25 mg by mouth every 12 (twelve) hours as needed for nausea or vomiting.   Yes  [provider]  RELPAX 40 MG tablet Take 1 tablet (40 mg total) by mouth every 2 (two) hours as needed. Take 1 tablet by mouth as needed for migraine and may take 1 additional tablet in 2 hours if no relief Patient taking differently: Take 40 mg by mouth See admin instructions. Take 1 tablet (40 mg) by mouth as needed for migraine and may take 1 additional tablet (40 mg) in 2 hours if no relief 04/22/17  Yes Medina-Vargas, Monina C, NP  topiramate (TOPAMAX) 100 MG tablet TAKE 1 TABLET AT BEDTIME START AFTER COMPLETION OF 75 MG DOSE ON 04/29/17 Patient taking differently: TAKE 1 TABLET (100 MG) BY MOUTH DAILY AT BEDTIME 06/15/17  Yes Kathrynn Ducking, MD  traZODone (DESYREL) 100 MG tablet Take 200 mg by mouth at bedtime. 04/29/17  Yes [provider]  vitamin B-12 (CYANOCOBALAMIN) 1000 MCG tablet Take 1 tablet (1,000 mcg total) by mouth daily. 02/19/17  Yes Debbe Odea, MD  vitamin E 100 UNIT capsule Take 100 Units by mouth daily.   Yes [provider]  feeding supplement, ENSURE ENLIVE, (ENSURE ENLIVE) LIQD Take 237 mLs by mouth 2 (two) times daily between meals. Patient not taking: Reported on 05/18/2017  02/19/17   Debbe Odea, MD  gabapentin (NEURONTIN) 100 MG capsule Take 1 capsule (100 mg total) by mouth at bedtime. Patient not taking: Reported on 05/18/2017 04/22/17   Medina-Vargas, Jaymes Graff C, NP  HYDROcodone-acetaminophen (NORCO) 7.5-325 MG tablet 1-2 tabs po daily prn pain Patient not taking: Reported on 05/18/2017 04/24/17   Leandrew Koyanagi, MD  ibuprofen (ADVIL,MOTRIN) 600 MG tablet Take 1 tablet (600 mg total) by mouth every 6 (six) hours as needed. Patient not taking: Reported on 06/29/2017 05/10/17   Shary Decamp, PA-C  ondansetron (ZOFRAN) 8 MG tablet Take 1 tablet (8 mg total) by mouth every 8 (eight) hours as needed for nausea or vomiting. Patient not taking: Reported on 06/29/2017 04/22/17   Medina-Vargas, Monina C, NP  topiramate (TOPAMAX) 25 MG tablet Take 1 tablet (25 mg total) by mouth daily. Take 25 mg tablet along with a 50 mg tablet to = 75 mg qd Patient not taking: Reported on 05/18/2017 04/22/17 03/30/18  Medina-Vargas, Monina C, NP  topiramate (TOPAMAX) 50 MG tablet Take 1 tablet (50 mg total) by mouth daily. Take 50 mg tablet, along with a 25 mg tablet, to = 75 mg qd Patient not taking: Reported on 05/18/2017 04/22/17 05/18/17  Medina-Vargas, Monina C, NP  trazodone (DESYREL) 300 MG tablet Take 1 tablet (300 mg total) by mouth at bedtime as needed for sleep. Patient not taking: Reported on 06/29/2017 04/22/17   Medina-Vargas, Senaida Lange, NP    Family History Family History  Problem Relation Age of Onset  . Lung cancer Mother 94  . Migraines Mother   . Heart attack Father        Vague  . Hypertension Brother   . Esophageal cancer Brother   . Diabetes Paternal Grandmother        questionable  . Colon cancer Neg Hx   . Kidney disease Neg Hx   . Liver disease Neg Hx     Social History Social History   Tobacco Use  . Smoking status: Current Some Day Smoker    Packs/day: 1.00    Years: 34.00    Pack years: 34.00    Types: Cigarettes    Last attempt to quit: 02/03/2017     Years since quitting: 0.4  . Smokeless  tobacco: Never Used  . Tobacco comment: 10 per week or less  Substance Use Topics  . Alcohol use: Yes    Alcohol/week: 0.6 oz    Types: 1 Standard drinks or equivalent per week    Comment: Denies 08/24/16  . Drug use: No    Comment: Denies 08/24/16     Allergies   Opana [oxymorphone hcl] and Penicillins   Review of Systems Review of Systems  ROS reviewed and all otherwise negative except for mentioned in HPI   Physical Exam Updated Vital Signs BP (!) 99/59   Pulse 62   Resp 14   Ht 5\' 5"  (1.651 m)   Wt 53.1 kg (117 lb)   SpO2 99%   BMI 19.47 kg/m  Vitals reviewed Physical Exam  Physical Examination: General appearance - alert, well appearing, and in no distress Mental status - alert, oriented to person, place, and time Eyes - no conjunctival injection, no scleral icterus Mouth - mucous membranes moist, pharynx normal without lesions Neck - supple, no significant adenopathy Chest - clear to auscultation, no wheezes, rales or rhonchi, symmetric air entry Heart - normal rate, regular rhythm, normal S1, S2, no murmurs, rubs, clicks or gallops Abdomen - soft, nontender, nondistended, no masses or organomegaly Neurological - alert, oriented, normal speech, no focal findings or movement disorder noted Extremities - peripheral pulses normal, no pedal edema, no clubbing or cyanosis Skin - normal coloration and turgor, no rashes   ED Treatments / Results  Labs (all labs ordered are listed, but only abnormal results are displayed) Labs Reviewed  I-STAT CHEM 8, ED - Abnormal; Notable for the following components:      Result Value   TCO2 20 (*)    All other components within normal limits  CBC  I-STAT TROPONIN, ED    EKG  EKG Interpretation  Date/Time:  Monday June 29 2017 18:58:30 EST Ventricular Rate:  69 PR Interval:    QRS Duration: 89 QT Interval:  596 QTC Calculation: 639 R Axis:   61 Text Interpretation:  Sinus  rhythm Borderline repolarization abnormality Prolonged QT interval Since previous tracing QT is lengthened Confirmed by Alfonzo Beers [phone]) on 06/29/2017 7:11:24 PM       Radiology Dg Chest 2 View  Result Date: 06/29/2017 CLINICAL DATA:  66 year old female with weakness dizziness and shortness of breath for 3 weeks. Nausea and headache for multiple days. EXAM: CHEST  2 VIEW COMPARISON:  CTA chest 05/18/2017 and earlier. FINDINGS: AP and lateral views of the chest. Stable lung volumes. Stable mild cardiomegaly. Other mediastinal contours are within normal limits. Visualized tracheal air column is within normal limits. No pneumothorax, pulmonary edema, pleural effusion or acute pulmonary opacity. Stable visualized osseous structures. Postoperative changes to the proximal right humerus. Calcified breast implants. Negative visible bowel gas pattern. IMPRESSION: No acute cardiopulmonary abnormality. Electronically Signed   By: Genevie Ann M.D.   On: 06/29/2017 21:03    Procedures Procedures (including critical care time)  Medications Ordered in ED Medications  sodium chloride 0.9 % bolus 1,000 mL (0 mLs Intravenous Stopped 06/29/17 2137)  ketorolac (TORADOL) 30 MG/ML injection 30 mg (30 mg Intravenous Given 06/29/17 1914)  prochlorperazine (COMPAZINE) injection 10 mg (10 mg Intravenous Given 06/29/17 1914)  diphenhydrAMINE (BENADRYL) injection 12.5 mg (12.5 mg Intravenous Given 06/29/17 1915)  sodium chloride 0.9 % bolus 1,000 mL (0 mLs Intravenous Stopped 06/29/17 2215)     Initial Impression / Assessment and Plan / ED Course  I have reviewed the  triage vital signs and the nursing notes.  Pertinent labs & imaging results that were available during my care of the patient were reviewed by me and considered in my medical decision making (see chart for details).   heart score of 3  9:49 PM  Pt's headache has resolved and she is finishing up the IV fluids.    Pt presenting with c/o feeling  lightheaded and elevated heart rate when walking.  She has had decreased p.o. intake due to her recent GERD flareup and is taking omeprazole.  She denies any chest pain.  Her orthostatics were somewhat elevated in terms of blood pressure when standing.  She was given 2 L of IV fluids.  She also complained of headache similar to her prior migraines which resolved after migraine cocktail in the ED.  She has a normal neuro exam.  Doubt ACS or PE as cause for her symptoms.  No findings of CHF or pneumonia on chest x-ray.  Troponin is negative.  She has a heart score of 3. Discharged with strict return precautions.  Pt agreeable with plan.  Final Clinical Impressions(s) / ED Diagnoses   Final diagnoses:  Near syncope  Migraine without aura and without status migrainosus, not intractable    ED Discharge Orders    None       Pixie Casino, MD 06/29/17 2322

## 2017-06-29 NOTE — Discharge Instructions (Signed)
Return to the ED with any concerns including difficulty breathing, fainting, chest pain, vomiting and not able to keep down liquids, decreased level of alertness/lethargy, or any other alarming symptoms

## 2017-06-29 NOTE — ED Notes (Signed)
Pt. To xray via stretcher

## 2017-06-29 NOTE — ED Notes (Signed)
Pt stable, ambulatory, states understanding of discharge instructions 

## 2017-06-29 NOTE — ED Triage Notes (Signed)
Pt arrives via EMS from home with complaints of weakness, dizziness, and SOB x3 weeks. Today presents with nausea and headache for multiple days. EMS gave 4 mg zofran.

## 2017-06-30 ENCOUNTER — Other Ambulatory Visit: Payer: Self-pay

## 2017-06-30 NOTE — Patient Outreach (Signed)
[facility]) Care Management  06/30/2017  Dorothy White 14-Dec-1950 [id number]   Patient has not responded to calls or letter. Will proceed with case closure.  Will notify care management assistant of case status.    Lazaro Arms RN, BSN, [place] Direct Dial:  [phone] Fax: [phone]

## 2017-07-01 ENCOUNTER — Ambulatory Visit (INDEPENDENT_AMBULATORY_CARE_PROVIDER_SITE_OTHER): Payer: PPO | Admitting: Neurology

## 2017-07-01 ENCOUNTER — Encounter: Payer: Self-pay | Admitting: Neurology

## 2017-07-01 VITALS — BP 140/78 | HR 79 | Ht 65.0 in | Wt 119.0 lb

## 2017-07-01 DIAGNOSIS — R55 Syncope and collapse: Secondary | ICD-10-CM

## 2017-07-01 DIAGNOSIS — R269 Unspecified abnormalities of gait and mobility: Secondary | ICD-10-CM

## 2017-07-01 DIAGNOSIS — G894 Chronic pain syndrome: Secondary | ICD-10-CM | POA: Diagnosis not present

## 2017-07-01 NOTE — Patient Instructions (Signed)
   We will continue the Topamax and check an EEG study.

## 2017-07-01 NOTE — Progress Notes (Signed)
Reason for visit: Headache, gait disturbance  Dorothy White is an 66 y.o. female  History of present illness:  Dorothy White is a 66 year old right-handed white female with a history of hemiplegic migraine.  The patient has gone on Topamax which seems to have benefited her headaches, she is only had to take 1 Relpax since she went on the medication.  She seems to be tolerating this drug, but she does have significant issues with gastroesophageal reflux disease, she will be seeing a gastroenterologist in the near future.  The patient had reported episodes of leg weakness that would come and go associated with difficulty with walking.  Since being on Topamax, these episodes have not recurred.  The patient had a fall on 10 May 2017, she had fallen previously in September fracturing her left hip requiring surgery.  The patient has a cane that she may use for ambulation.  She does not consistently use this.  Overall, the use of Topamax she feels has helped her.  She indicates that when she fell and broke her hip she has no recollection of the fall, she does not remember going to the hospital.  She denies any chest pain or palpitations of the heart around this time.  The patient has had a similar type of fall 2 years ago when she may have blacked out and fractured her right shoulder.  The patient returns to this office for further evaluation.  Past Medical History:  Diagnosis Date  . Anemia    ?  Marland Kitchen Anxiety   . Arthritis    "everywhere" (04/25/2016)  . 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 Chronic back pain    "all over" (04/25/2016)  . Constipation   . Depression   . Diverticulitis   . GERD (gastroesophageal reflux disease)   . Headache    "weekly" (04/25/2016)  . 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 HLD (hyperlipidemia)    hx (04/25/2016)  . 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 Mild CAD    a. 25% mLAD, otherwise no sig  disease 01/2016 cath.  Marland Kitchen NICM (nonischemic cardiomyopathy) ([place])    a. EF 40-45% by echo 07/173 at time of complicated migraine/neuro sx, 55-65% at time of cath 01/2016  . Stroke [facility]) 06/2013   "mini" stroke , ?possibly hemaplegic migraine per pt    Past Surgical History:  Procedure Laterality Date  . ANTERIOR APPROACH HEMI HIP ARTHROPLASTY Left 04/01/2017   Procedure: LEFT DIRECT ANTERIOR TOTAL HIP REPLACEMENT;  Surgeon: Leandrew Koyanagi, MD;  Location: [place];  Service: Orthopedics;  Laterality: Left;  LEFT DIRECT ANTERIOR TOTAL HIP REPLACEMENT  . AUGMENTATION MAMMAPLASTY  1980  . BASAL CELL CARCINOMA EXCISION     "tip of my nose"  . BUNIONECTOMY Bilateral 10/2003  . CARDIAC CATHETERIZATION N/A 02/20/2016   Procedure: Left Heart Cath and Coronary Angiography;  Surgeon: Jettie Booze, MD;  Location: [place] [facility];  Service: Cardiovascular;  Laterality: N/A;  . COLONOSCOPY    . DILATION AND CURETTAGE OF UTERUS    . FRACTURE SURGERY    . IR GENERIC HISTORICAL  03/18/2016   IR SINUS/FIST TUBE CHK-NON GI 03/18/2016 Aletta Edouard, MD [facility]-INTERV RAD  . LAPAROSCOPIC SIGMOID COLECTOMY N/A 04/25/2016   Procedure: LAPAROSCOPIC SIGMOID COLECTOMY;  Surgeon: Stark Klein, MD;  Location: [place];  Service: General;  Laterality: N/A;  . OOPHORECTOMY Bilateral ~ 1999  . ORIF HUMERUS FRACTURE Right  02/15/2015   Procedure: OPEN REDUCTION INTERNAL FIXATION (ORIF) PROXIMAL HUMERUS FRACTURE;  Surgeon: Netta Cedars, MD;  Location: [place];  Service: Orthopedics;  Laterality: Right;  . RHINOPLASTY  1976  . TONSILLECTOMY    . TUBAL LIGATION  ~ 1983  . VAGINAL HYSTERECTOMY  ~ 1998    Family History  Problem Relation Age of Onset  . Lung cancer Mother 59  . Migraines Mother   . Heart attack Father        Vague  . Hypertension Brother   . Esophageal cancer Brother   . Diabetes Paternal Grandmother        questionable  . Colon cancer Neg Hx   . Kidney disease Neg Hx   . Liver disease Neg Hx      Social history:  reports that she has been smoking cigarettes.  She has a 34.00 pack-year smoking history. she has never used smokeless tobacco. She reports that she drinks about 0.6 oz of alcohol per week. She reports that she does not use drugs.    Allergies  Allergen Reactions  . Opana [Oxymorphone Hcl] Other (See Comments)    hallucinations  . Penicillins Hives    Has patient had a PCN reaction causing immediate rash, facial/tongue/throat swelling, SOB or lightheadedness with hypotension: Yes Has patient had a PCN reaction causing severe rash involving mucus membranes or skin necrosis: No Has patient had a PCN reaction that required hospitalization No Has patient had a PCN reaction occurring within the last 10 years: No If all of the above answers are "NO", then may proceed with Cephalosporin use.     Medications:  Prior to Admission medications   Medication Sig Start Date End Date Taking? Authorizing Provider  acetaminophen (TYLENOL) 325 MG tablet Take 975 mg by mouth every 6 (six) hours as needed for headache (pain).    Yes [provider]  aspirin EC 81 MG tablet Take 81 mg by mouth every other day.   Yes [provider]  CALCIUM PO Take 1 tablet by mouth daily with lunch.   Yes [provider]  cholecalciferol (VITAMIN D) 1000 units tablet Take 1,000 Units by mouth daily.   Yes [provider]  clonazePAM (KLONOPIN) 1 MG tablet Take 1 mg by mouth 2 (two) times daily as needed for anxiety.    Yes [provider]  FLUoxetine (PROZAC) 20 MG tablet Take 3 tablets (60 mg total) by mouth every morning. Take 3 tablets to = 60 mg qam Patient taking differently: Take 60 mg by mouth daily. 3 tablets 04/22/17  Yes Medina-Vargas, Monina C, NP  ibuprofen (ADVIL,MOTRIN) 600 MG tablet Take 1 tablet (600 mg total) by mouth every 6 (six) hours as needed. 05/10/17  Yes Shary Decamp, PA-C  LUTEIN PO Take 1 capsule by mouth daily.    Yes [provider]  Magnesium 500 MG TABS Take 500 mg by mouth at bedtime.   Yes [provider]  mometasone (NASONEX) 50 MCG/ACT nasal spray Place 2 sprays into the nose daily. 04/22/17  Yes Medina-Vargas, Monina C, NP  omeprazole (PRILOSEC) 40 MG capsule Take 40 mg by mouth daily. 06/22/17  Yes [provider]  ondansetron (ZOFRAN) 8 MG tablet Take 1 tablet (8 mg total) by mouth every 8 (eight) hours as needed for nausea or vomiting. 04/22/17  Yes Medina-Vargas, Monina C, NP  promethazine (PHENERGAN) 25 MG tablet Take 25 mg by mouth every 12 (twelve) hours as needed for nausea or vomiting.  Yes [provider]  RELPAX 40 MG tablet Take 1 tablet (40 mg total) by mouth every 2 (two) hours as needed. Take 1 tablet by mouth as needed for migraine and may take 1 additional tablet in 2 hours if no relief Patient taking differently: Take 40 mg by mouth See admin instructions. Take 1 tablet (40 mg) by mouth as needed for migraine and may take 1 additional tablet (40 mg) in 2 hours if no relief 04/22/17  Yes Medina-Vargas, Monina C, NP  topiramate (TOPAMAX) 100 MG tablet TAKE 1 TABLET AT BEDTIME START AFTER COMPLETION OF 75 MG DOSE ON 04/29/17 Patient taking differently: TAKE 1 TABLET (100 MG) BY MOUTH DAILY AT BEDTIME 06/15/17  Yes Kathrynn Ducking, MD  trazodone (DESYREL) 300 MG tablet Take 1 tablet (300 mg total) by mouth at bedtime as needed for sleep. 04/22/17  Yes Medina-Vargas, Monina C, NP  vitamin B-12 (CYANOCOBALAMIN) 1000 MCG tablet Take 1 tablet (1,000 mcg total) by mouth daily. 02/19/17  Yes Debbe Odea, MD  vitamin E 100 UNIT capsule Take 100 Units by mouth daily.   Yes [provider]    ROS:  Out of a complete 14 system review of symptoms, the patient complains only of the following symptoms, and all other reviewed systems are negative.  Decreased appetite, fatigue Hearing loss, ringing in the ears Cold intolerance Bruising easily, anemia Dizziness,  headache, numbness, seizures, weakness Joint pain, back pain, neck pain, neck stiffness Depression, anxiety  Blood pressure 140/78, pulse 79, height 5\' 5"  (1.651 m), weight 119 lb (54 kg), SpO2 98 %.  Physical Exam  General: The patient is alert and cooperative at the time of the examination.  Skin: No significant peripheral edema is noted.   Neurologic Exam  Mental status: The patient is alert and oriented x 3 at the time of the examination. The patient has apparent normal recent and remote memory, with an apparently normal attention span and concentration ability.   Cranial nerves: Facial symmetry is present. Speech is normal, no aphasia or dysarthria is noted. Extraocular movements are full. Visual fields are full.  Mild masking of the face is seen.  Motor: The patient has good strength in all 4 extremities.  Sensory examination: Soft touch sensation is symmetric on the face, arms, and legs.  Coordination: The patient has good finger-nose-finger and heel-to-shin bilaterally.  Gait and station: The patient has a slightly wide-based gait, the patient is able to ambulate independently.  With walking, there is decreased arm swing on the left.  Romberg is negative. No drift is seen.  Reflexes: Deep tendon reflexes are symmetric.   CT head and cervical 03/31/17:  IMPRESSION: 1. No evidence of acute intracranial abnormality. 2. Left parietal scalp hematoma. 3. No evidence of fracture or traumatic subluxation in the cervical Spine.  * CT scan images were reviewed online. I agree with the written report.    Assessment/Plan:  1.  Gait disturbance  2.  History of hemiplegic migraine  3.  Recent syncopal event  The patient appears to have a gait pattern associated with decreased arm swing on the left.  We will need to follow the patient over time for any evolution of symptoms of Parkinson's disease.  The patient had a fall with a hip fracture associated with a syncopal event.   The patient will be set up for an EEG study.  The patient is to use a cane when outside of the house ambulating.  She will follow-up in 4  months.  Jill Alexanders MD 07/01/2017 12:42 PM  [facility] [address] [place], [place] [address]  Phone [phone] Fax [phone]

## 2017-07-08 ENCOUNTER — Other Ambulatory Visit: Payer: PPO

## 2017-07-09 ENCOUNTER — Telehealth: Payer: Self-pay | Admitting: Neurology

## 2017-07-09 ENCOUNTER — Encounter: Payer: Self-pay | Admitting: Neurology

## 2017-07-09 NOTE — Telephone Encounter (Signed)
Pt said she rec'd a call on Monday that her appt on 12/12 has been c/a due to weather. Pt said she rec'd a letter in [place] stating that is it a no show. Pt is not wanting to be penalized for not coming to appt when she was advised not to come. Please advise

## 2017-07-09 NOTE — Telephone Encounter (Signed)
Tried calling pt back, LVM asking for her to call back.  Regarding the no show, the system automatically marked it as no show. Angie will just have to be notified. She will not be charged for that appt.   Need to know if she got appt rescheduled?  If not, can you please r/s pt? Can offer 07/16/17 at 730am

## 2017-07-12 ENCOUNTER — Other Ambulatory Visit: Payer: Self-pay | Admitting: Neurology

## 2017-07-12 DIAGNOSIS — G43419 Hemiplegic migraine, intractable, without status migrainosus: Secondary | ICD-10-CM

## 2017-07-23 ENCOUNTER — Telehealth: Payer: Self-pay | Admitting: Neurology

## 2017-07-23 ENCOUNTER — Ambulatory Visit (INDEPENDENT_AMBULATORY_CARE_PROVIDER_SITE_OTHER): Payer: PPO | Admitting: Neurology

## 2017-07-23 DIAGNOSIS — R55 Syncope and collapse: Secondary | ICD-10-CM

## 2017-07-23 NOTE — Procedures (Signed)
    History:  Dorothy White is a 66 year old patient with a history of hemiplegic migraine headache.  The patient has reported episodes of leg weakness that would come and go associated with difficulty with walking.  The patient recently had a fall associated with a hip fracture that was due to a syncopal event.  The patient is being evaluated for this.  This is a routine EEG.  No skull defects are noted.  Medications include aspirin, calcium, vitamin D, clonazepam, Prozac, magnesium, Nasonex, Prilosec, Zofran, Phenergan, Relpax, Topamax, trazodone, and vitamin B12.  EEG classification: Normal awake  Description of the recording: The background rhythms of this recording consists of a fairly well modulated medium amplitude alpha rhythm of 8 Hz that is reactive to eye opening and closure. As the record progresses, the patient appears to remain in the waking state throughout the recording. Photic stimulation was performed, resulting in a bilateral and symmetric photic driving response. Hyperventilation was also performed, resulting in a minimal buildup of the background rhythm activities without significant slowing seen. At no time during the recording does there appear to be evidence of spike or spike wave discharges or evidence of focal slowing. EKG monitor shows no evidence of cardiac rhythm abnormalities with a heart rate of 78.  Impression: This is a normal EEG recording in the waking state. No evidence of ictal or interictal discharges are seen.

## 2017-07-23 NOTE — Telephone Encounter (Signed)
I called the patient. The EEG was normal.  

## 2017-07-28 DIAGNOSIS — S0990XA Unspecified injury of head, initial encounter: Secondary | ICD-10-CM

## 2017-07-28 HISTORY — DX: Unspecified injury of head, initial encounter: S09.90XA

## 2017-08-06 IMAGING — CT CT HEAD W/O CM
4 of 5 series · 16 of 33 positions shown, 18 images · non-contrast
Comparison: CT head 02/04/2015

CLINICAL DATA: Laceration to the top of the forehead. Patient fell
in struck a wall. Injury yesterday. No loss of consciousness. Now
complains of dizziness and nausea. Headache.

EXAM:
CT HEAD WITHOUT CONTRAST
CT CERVICAL SPINE WITHOUT CONTRAST
TECHNIQUE: Multidetector CT imaging of the head and cervical spine was
performed following the standard protocol without intravenous
contrast. Multiplanar CT image reconstructions of the cervical spine
were also generated.

[Series 6: c_spine 2.0 b31s · axial · 0.27mm/px · z∈[-271,-175]mm · 4 of 81 slices shown]
[im 17/81  bone]
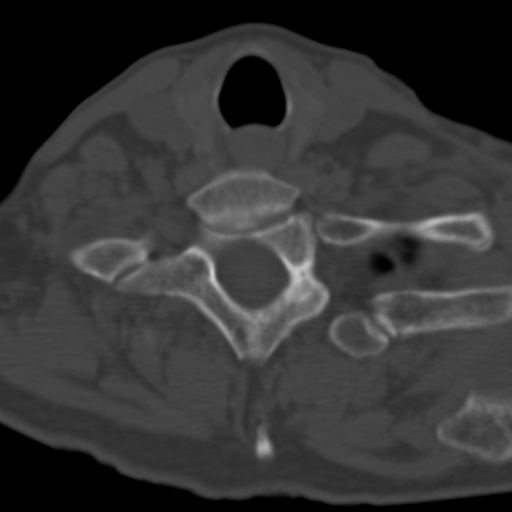
[im 33/81  bone]
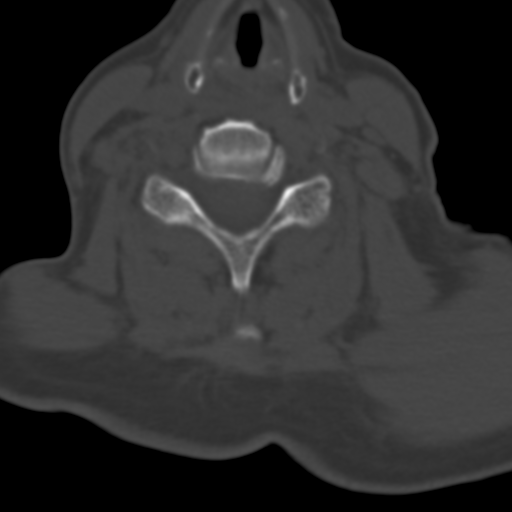
[im 49/81  bone]
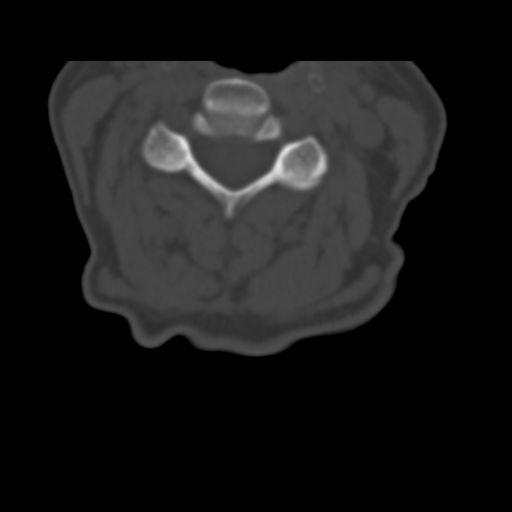
[im 65/81  bone]
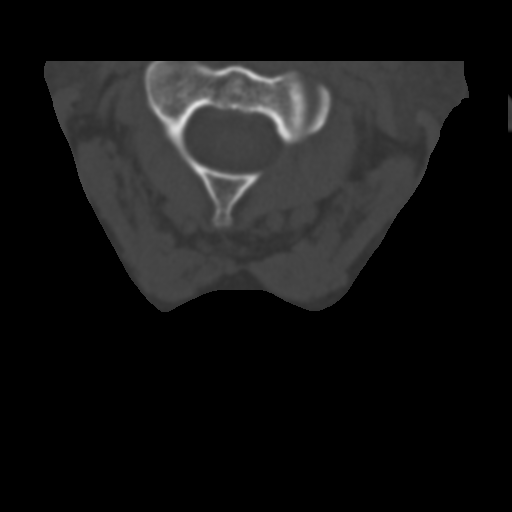

[Series 602: <mpr thick range> · axial · 0.31mm/px · z∈[-312,-212]mm · 4 of 92 slices shown, 5 images]
[im 19/92  soft-tissue]
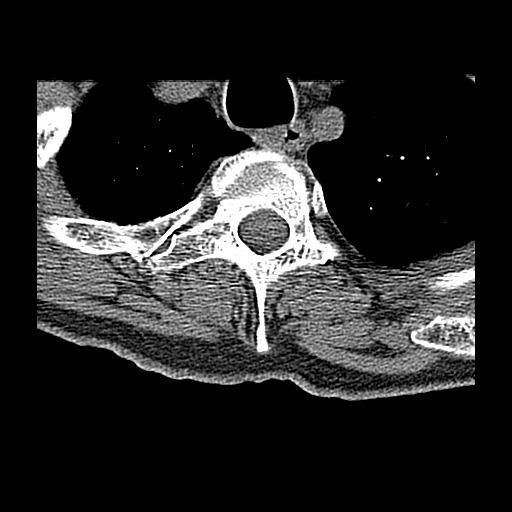
[im 19/92  bone]
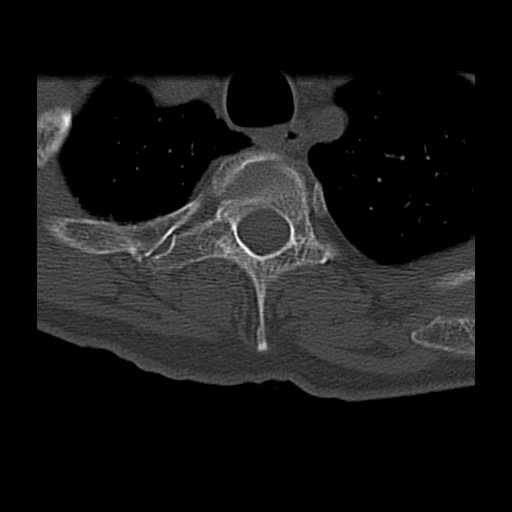
[im 37/92  bone]
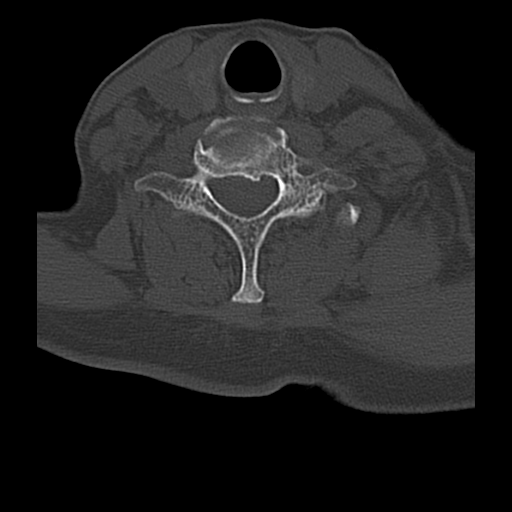
[im 55/92  bone]
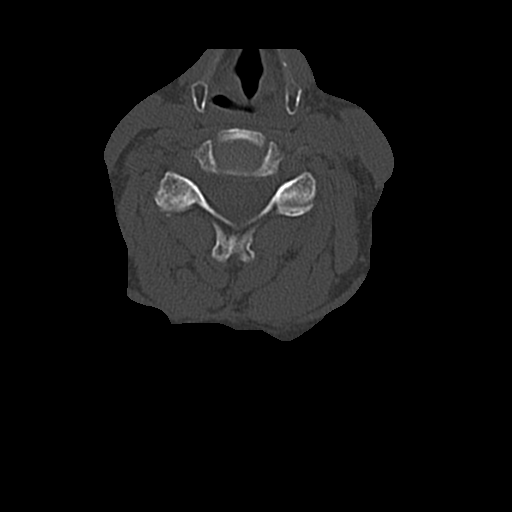
[im 73/92  bone]
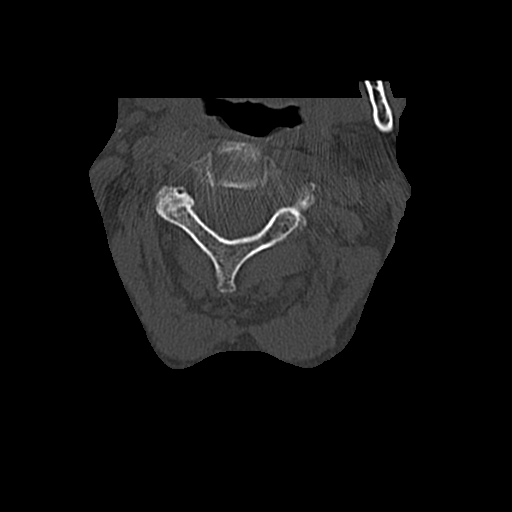

[Series 603: <mpr thick range(1)> · coronal · 0.31mm/px · 3 of 67 slices shown]
[im 14/67  bone]
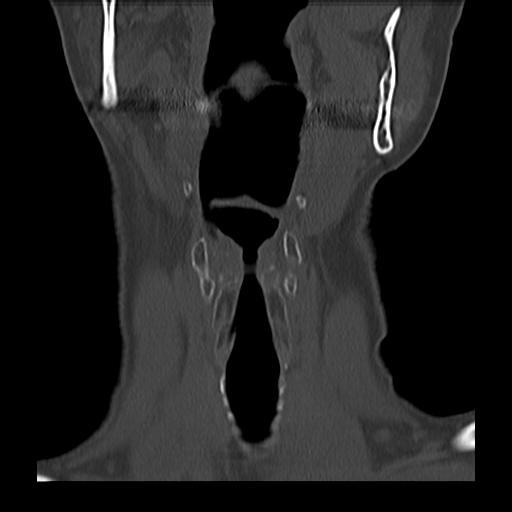
[im 27/67  bone]
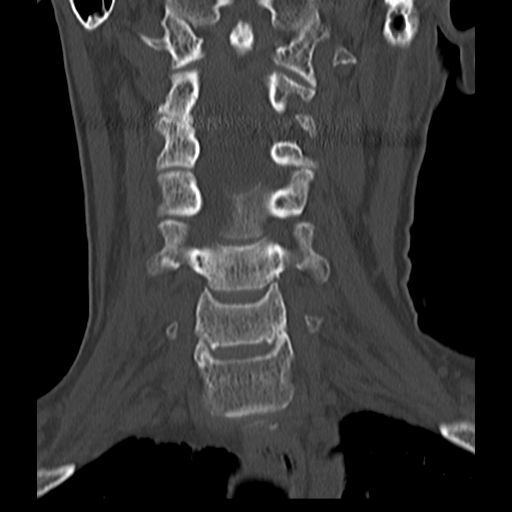
[im 40/67  bone]
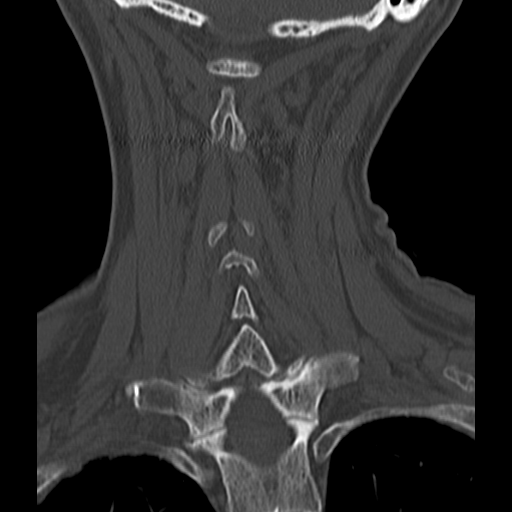

[Series 604: <mpr thick range(2)> · sagittal · 0.31mm/px · 5 of 68 slices shown, 6 images]
[im 23/68  bone]
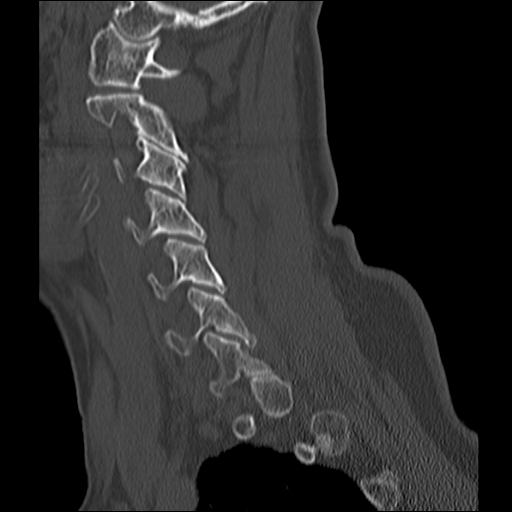
[im 28/68  bone]
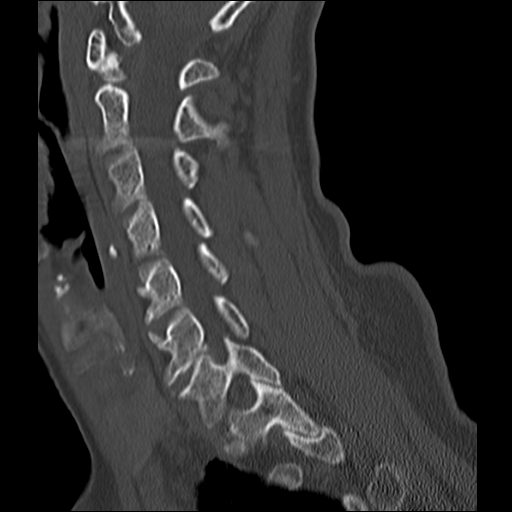
[im 34/68  soft-tissue]
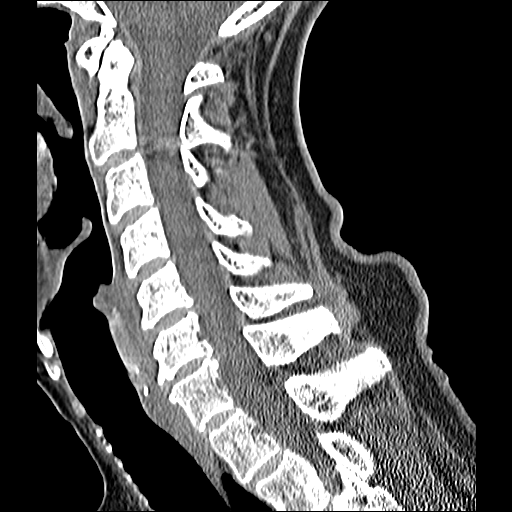
[im 34/68  bone]
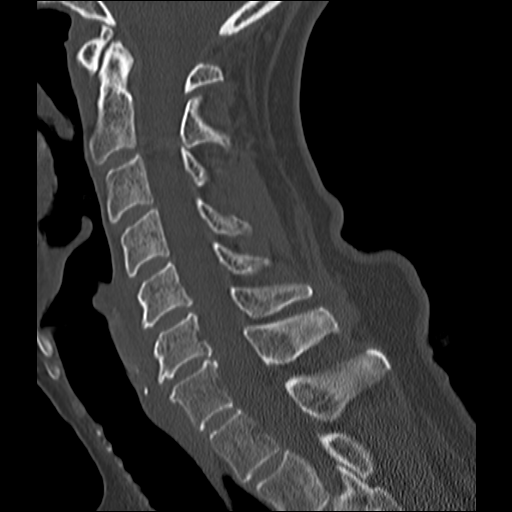
[im 40/68  bone]
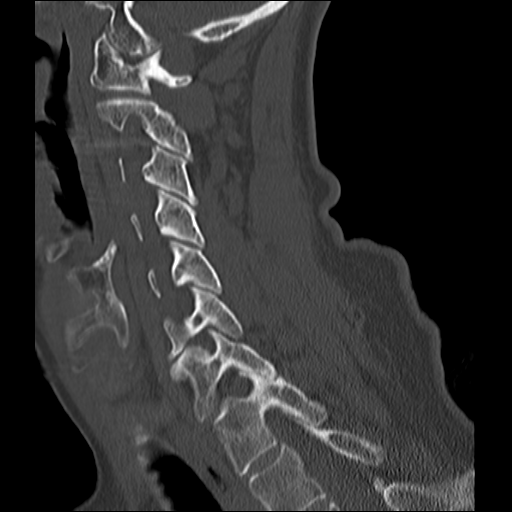
[im 45/68  bone]
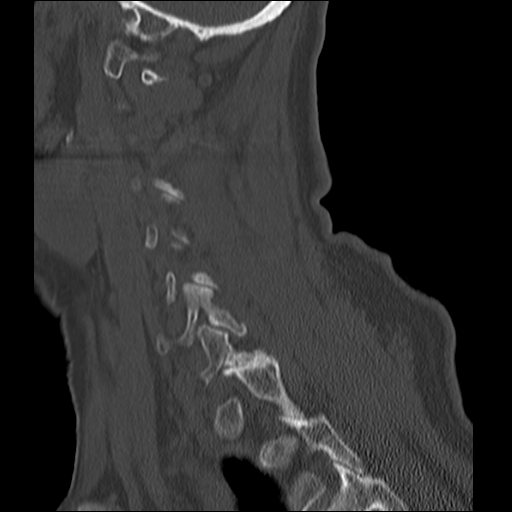

[16 of 33 positions shown; findings below may reference images not displayed]

FINDINGS: CT HEAD FINDINGS

There is laceration and small subcutaneous scalp hematoma over the
right anterior frontal region. No underlying bone abnormality. Mild
diffuse cerebral atrophy. Patchy low-attenuation change in the deep
white matter suggesting small vessel ischemia. No ventricular
dilatation. No mass effect or midline shift. No abnormal extra-axial
fluid collections. Gray-white matter junctions are distinct. Basal
cisterns are not effaced. No evidence of acute intracranial
hemorrhage. No depressed skull fractures. Visualized paranasal
sinuses and mastoid air cells are not opacified. Old appearing nasal
bone fractures.

CT CERVICAL SPINE FINDINGS

Normal alignment of the cervical spine. Degenerative changes with
narrowed cervical interspaces and endplate hypertrophic changes most
prominent at C5-6 and C6-7 levels. No vertebral compression
deformities. No prevertebral soft tissue swelling. No focal bone
lesion or bone destruction. Bone cortex and trabecular architecture
appear intact. C1-2 articulation appears intact. Soft tissues are
unremarkable.
IMPRESSION: No acute intracranial abnormalities. Mild chronic atrophy and small
vessel ischemic changes.

Normal alignment of the cervical spine. Degenerative changes. No
acute displaced fractures identified.

## 2017-08-26 ENCOUNTER — Telehealth: Payer: Self-pay | Admitting: *Deleted

## 2017-08-26 DIAGNOSIS — G43419 Hemiplegic migraine, intractable, without status migrainosus: Secondary | ICD-10-CM

## 2017-08-26 MED ORDER — TOPIRAMATE 100 MG PO TABS: ORAL_TABLET | ORAL | 4 refills | Status: DC

## 2017-08-26 NOTE — Telephone Encounter (Signed)
Received fax from [facility] mail order pharmacy to clarify prescription for Topiramate 100 mg that was ordered by Dr. Jannifer Franklin. This was prescribed on 07/09/17 & sent to [facility] pharmacy.   Topiramate 100 mg PO daily at bedtime. Prescription reordered & e-scribed to [facility] with 4 refills.   Called [facility] to make them aware that prescription was sent to mail order pharmacy. They will keep current prescription on file just in case.

## 2017-09-08 DIAGNOSIS — Z79891 Long term (current) use of opiate analgesic: Secondary | ICD-10-CM | POA: Insufficient documentation

## 2017-10-23 IMAGING — CR DG CHEST 2V
2 series · 2 of 2 positions shown · non-contrast
Comparison: 02/04/2015

CLINICAL DATA: Chest pain and shortness of breath, RIGHT-sided neck
pain last night and this morning moving into mid chest with
increased shortness of breath, dizziness, and nausea, former smoker

EXAM:
CHEST  2 VIEW

[w chest pa]
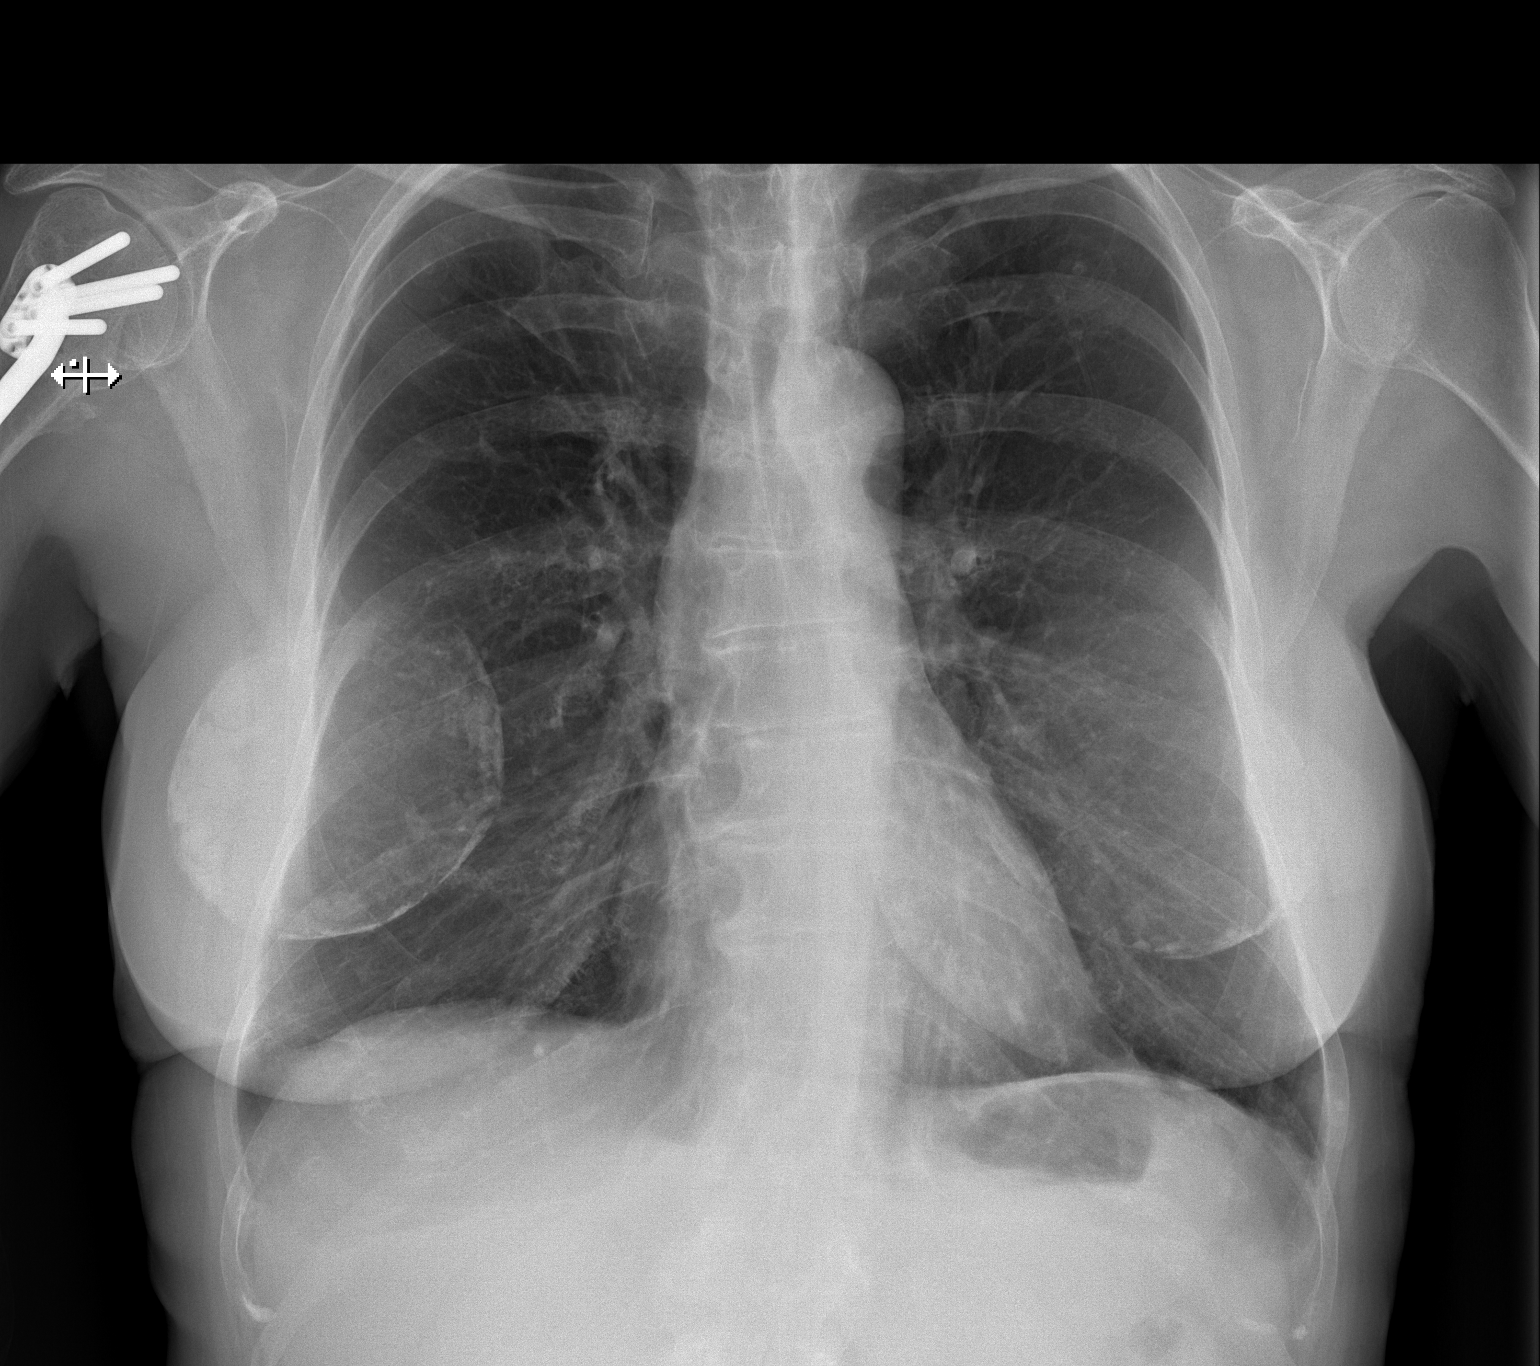

[w chest lat]
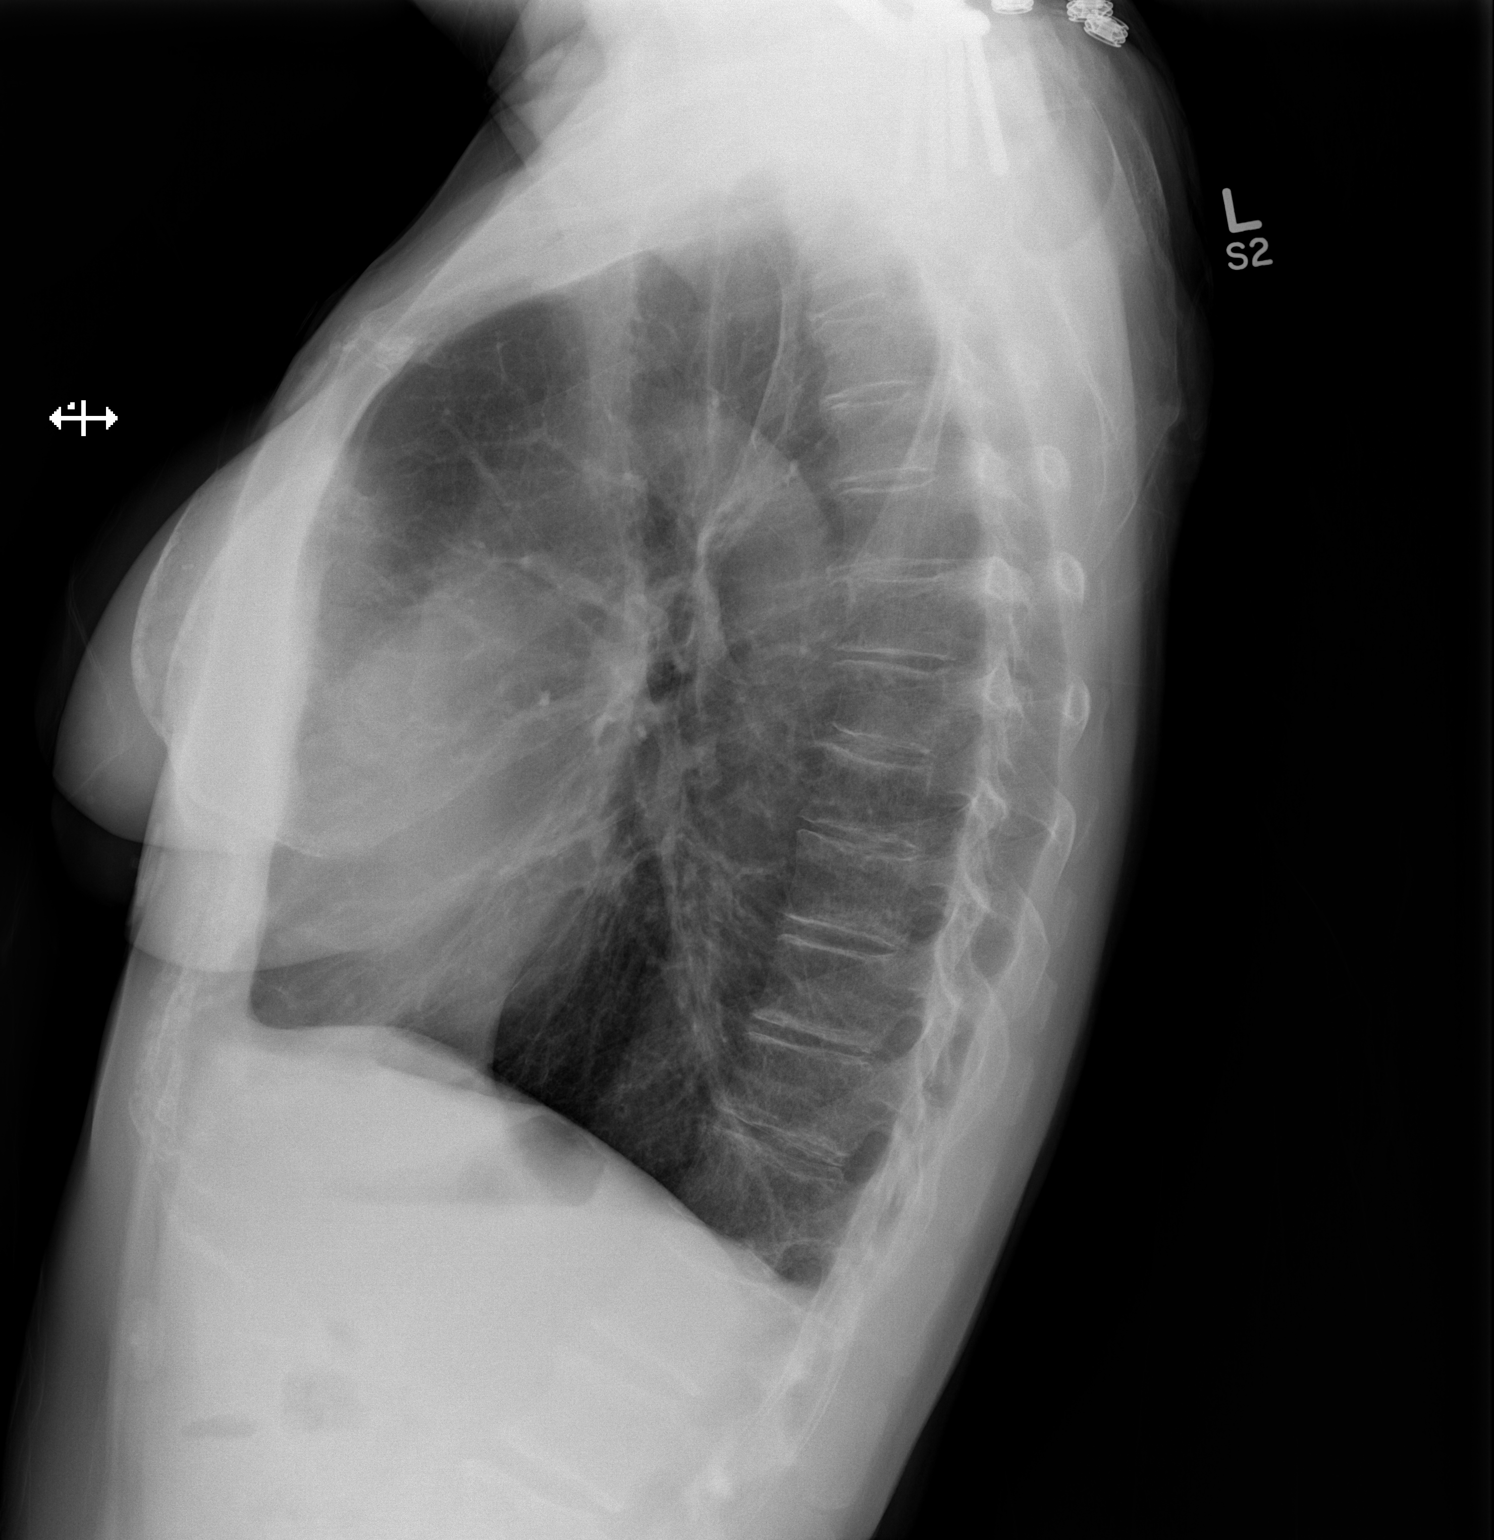

[2 of 2 positions shown; findings below may reference images not displayed]

FINDINGS: Normal heart size, mediastinal contours, and pulmonary vascularity.

Lungs hyperinflated but clear.

No infiltrate, pleural effusion or pneumothorax.

Capsular calcification at BILATERAL breast prostheses.

Bones demineralized with endplate spur formation thoracic spine and
mild broad-based levoconvex scoliosis.

Prior ORIF proximal RIGHT humerus.
IMPRESSION: Probable COPD changes.

No acute abnormalities.

## 2017-11-15 ENCOUNTER — Emergency Department (HOSPITAL_COMMUNITY)
Admission: EM | Admit: 2017-11-15 | Discharge: 2017-11-15 | Disposition: A | Discharge status: Home / Self Care | Payer: Medicare Other | Attending: Emergency Medicine | Admitting: Emergency Medicine

## 2017-11-15 ENCOUNTER — Encounter (HOSPITAL_COMMUNITY): Payer: Self-pay | Admitting: *Deleted

## 2017-11-15 ENCOUNTER — Other Ambulatory Visit: Payer: Self-pay

## 2017-11-15 DIAGNOSIS — M549 Dorsalgia, unspecified: Secondary | ICD-10-CM | POA: Diagnosis present

## 2017-11-15 DIAGNOSIS — Z5321 Procedure and treatment not carried out due to patient leaving prior to being seen by health care provider: Secondary | ICD-10-CM | POA: Insufficient documentation

## 2017-11-15 NOTE — ED Triage Notes (Signed)
Pt fell twice last night landing on her left buttocks as she was going to the bathroom. States back hurts but also has chronic back pain.

## 2017-11-16 ENCOUNTER — Emergency Department (HOSPITAL_COMMUNITY)
Admission: EM | Admit: 2017-11-16 | Discharge: 2017-11-16 | Disposition: A | Discharge status: Home / Self Care | Payer: Medicare Other | Attending: Emergency Medicine | Admitting: Emergency Medicine

## 2017-11-16 ENCOUNTER — Emergency Department (HOSPITAL_COMMUNITY): Payer: Medicare Other

## 2017-11-16 ENCOUNTER — Encounter (HOSPITAL_COMMUNITY): Payer: Self-pay | Admitting: Emergency Medicine

## 2017-11-16 DIAGNOSIS — W010XXA Fall on same level from slipping, tripping and stumbling without subsequent striking against object, initial encounter: Secondary | ICD-10-CM | POA: Insufficient documentation

## 2017-11-16 DIAGNOSIS — M545 Low back pain, unspecified: Secondary | ICD-10-CM

## 2017-11-16 DIAGNOSIS — Y998 Other external cause status: Secondary | ICD-10-CM | POA: Insufficient documentation

## 2017-11-16 DIAGNOSIS — Y9301 Activity, walking, marching and hiking: Secondary | ICD-10-CM | POA: Insufficient documentation

## 2017-11-16 DIAGNOSIS — M25552 Pain in left hip: Secondary | ICD-10-CM | POA: Diagnosis not present

## 2017-11-16 DIAGNOSIS — Y929 Unspecified place or not applicable: Secondary | ICD-10-CM | POA: Insufficient documentation

## 2017-11-16 DIAGNOSIS — M549 Dorsalgia, unspecified: Secondary | ICD-10-CM | POA: Diagnosis present

## 2017-11-16 DIAGNOSIS — Z5321 Procedure and treatment not carried out due to patient leaving prior to being seen by health care provider: Secondary | ICD-10-CM | POA: Insufficient documentation

## 2017-11-16 DIAGNOSIS — W19XXXA Unspecified fall, initial encounter: Secondary | ICD-10-CM

## 2017-11-16 MED ORDER — METHOCARBAMOL 500 MG PO TABS: 500.0000 mg | ORAL_TABLET | Freq: Two times a day (BID) | ORAL | 0 refills | Status: DC | PRN

## 2017-11-16 NOTE — ED Notes (Signed)
Pt has not been seen in room for 30 minutes. Arm band is sitting at bedside as though it has been taken off wrist.

## 2017-11-16 NOTE — ED Triage Notes (Signed)
Pt reports that she fell on slick floor in her socks 3 times on Saturday. Pt c/o lower back pain, buttock pain, and left hip pain. Hx left hip replacement.

## 2017-11-16 NOTE — ED Provider Notes (Cosign Needed)
[facility] DEPT Provider Note   CSN: [id number] Arrival date & time: 11/16/17  1133     History   Chief Complaint Chief Complaint  Patient presents with  . Fall  . Back Pain  . Hip Pain    HPI Dorothy White is a 67 y.o. female with history of chronic back pain, chronic pain syndrome, migraines, constipation, depression, GERD presents for evaluation o acute onset, progressively worsening left hip pain for 2 days.  She states that she sustained 3 mechanical falls while wearing socks on a slick wooden floor on Saturday 2 days ago.  She states that she landed on her buttocks every time and denies head injury or loss of consciousness.  She notes a constant throbbing pain to the left hip which radiates down the lateral aspect of the left thigh.  Pain is intermittently sharp.  Pain worsens with changing positions and sitting.  She states that pain is somewhat alleviated by shifting her weight to her right side.  She denies numbness or tingling.  She notes left-sided back pain as well which is similar to her usual back pain.  She denies fevers, chills, IV drug use, saddle anesthesia, or bowel or bladder incontinence.  She states she has been taking ibuprofen and Tylenol without significant relief of her symptoms and applying heat without significant relief.  [place] controlled substance registry shows that she  Frances Maywood oxycodone 10/325 mg tablets regularly from she also receives clonazepam 1 mg tablets from a different provider. provider Levy Pupa.  Most recently she received 120 tablets on 11/03/17.  She tells me that she threw away this prescription due to constipation as a side effect.  She has been receiving prescriptions for oxycodone 10/325 mg tablets since at least 11/2015. The history is provided by the patient.    Past Medical History:  Diagnosis Date  . Anemia    ?  Marland Kitchen Anxiety   . Arthritis    "everywhere" (04/25/2016)  . 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 Chronic back pain    "all over" (04/25/2016)  . Constipation   . Depression   . Diverticulitis   . GERD (gastroesophageal reflux disease)   . Headache    "weekly" (04/25/2016)  . 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 HLD (hyperlipidemia)    hx (04/25/2016)  . 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 Mild CAD    a. 25% mLAD, otherwise no sig disease 01/2016 cath.  Marland Kitchen NICM (nonischemic cardiomyopathy) ([place])    a. EF 40-45% by echo 0/1751 at time of complicated migraine/neuro sx, 55-65% at time of cath 01/2016  . Stroke [facility]) 06/2013   "mini" stroke , ?possibly hemaplegic migraine per pt    Patient Active Problem List   Diagnosis Date Noted  . Recurrent falls 04/16/2017  . 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 At risk for adverse drug event 04/07/2017  . Acute blood loss as cause of postoperative anemia 04/04/2017  . Acute delirium 04/04/2017  . Osteoporosis 04/02/2017  . Closed fracture of neck of left femur ([place]) 03/31/2017  . Scalp contusion 03/31/2017  . Fall at home, initial encounter 03/31/2017  . Hip fracture ([place]) 03/31/2017  . 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 Hx of transient ischemic attack (TIA) 02/19/2017  . Left carotid stenosis- 40-59% 02/19/2017  . Malnutrition of moderate degree 02/18/2017  . Falls 02/17/2017  . Chest pain 09/26/2016  .  Diverticulitis of sigmoid colon 04/25/2016  . Infection due to ESBL-producing Escherichia coli/Diverticular Abscess 03/15/2016  . Diverticulitis 03/12/2016  . Chronic pain syndrome 03/12/2016  . Lesion of pancreas 03/12/2016  . History of chest pain 03/12/2016  . Hypokalemia 03/12/2016  . Angina, class IV ([place]) - chest tightness and pressure with dyspnea with minimal exertion 02/17/2016    Class: Question of  . TIA (transient ischemic attack) 12/26/2015  . DOE (dyspnea on exertion) 12/26/2015  . Right sided weakness 12/17/2015    . Ataxia 12/17/2015  . Shoulder fracture 02/15/2015  . Chronic headache 10/11/2013  . Weakness generalized 08/12/2013  . Dizziness and giddiness 08/12/2013  . Abnormality of gait 07/11/2013  . Hyperlipidemia 05/07/2007  . Migraine without aura 05/07/2007  . PREMATURE VENTRICULAR CONTRACTIONS, FREQUENT 05/07/2007  . GERD 05/07/2007  . DIVERTICULOSIS, COLON 05/07/2007  . 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 OSTEOPENIA 05/07/2007  . Anxiety and depression 03/24/2007  . HEMORRHOIDS 03/24/2007  . ALLERGIC RHINITIS 03/24/2007  . LOW BACK PAIN 03/24/2007  . MIGRAINES, HX OF 03/24/2007    Past Surgical History:  Procedure Laterality Date  . ANTERIOR APPROACH HEMI HIP ARTHROPLASTY Left 04/01/2017   Procedure: LEFT DIRECT ANTERIOR TOTAL HIP REPLACEMENT;  Surgeon: Leandrew Koyanagi, MD;  Location: [facility];  Service: Orthopedics;  Laterality: Left;  LEFT DIRECT ANTERIOR TOTAL HIP REPLACEMENT  . AUGMENTATION MAMMAPLASTY  1980  . BASAL CELL CARCINOMA EXCISION     "tip of my nose"  . BUNIONECTOMY Bilateral 10/2003  . CARDIAC CATHETERIZATION N/A 02/20/2016   Procedure: Left Heart Cath and Coronary Angiography;  Surgeon: Jettie Booze, MD;  Location: [facility];  Service: Cardiovascular;  Laterality: N/A;  . COLONOSCOPY    . DILATION AND CURETTAGE OF UTERUS    . FRACTURE SURGERY    . IR GENERIC HISTORICAL  03/18/2016   IR SINUS/FIST TUBE CHK-NON GI 03/18/2016 Aletta Edouard, MD [facility]INTERV RAD  . LAPAROSCOPIC SIGMOID COLECTOMY N/A 04/25/2016   Procedure: LAPAROSCOPIC SIGMOID COLECTOMY;  Surgeon: Stark Klein, MD;  Location: [facility];  Service: General;  Laterality: N/A;  . OOPHORECTOMY Bilateral ~ 1999  . ORIF HUMERUS FRACTURE Right 02/15/2015   Procedure: OPEN REDUCTION INTERNAL FIXATION (ORIF) PROXIMAL HUMERUS FRACTURE;  Surgeon: Netta Cedars, MD;  Location: [facility];  Service: Orthopedics;  Laterality: Right;  . RHINOPLASTY  1976  . TONSILLECTOMY    . TUBAL LIGATION  ~ 1983  . VAGINAL  HYSTERECTOMY  ~ 1998     OB History   None      Home Medications    Prior to Admission medications   Medication Sig Start Date End Date Taking? Authorizing Provider  acetaminophen (TYLENOL) 325 MG tablet Take 975 mg by mouth every 6 (six) hours as needed for headache (pain).     [provider]  aspirin EC 81 MG tablet Take 81 mg by mouth every other day.    [provider]  CALCIUM PO Take 1 tablet by mouth daily with lunch.    [provider]  cholecalciferol (VITAMIN D) 1000 units tablet Take 1,000 Units by mouth daily.    [provider]  clonazePAM (KLONOPIN) 1 MG tablet Take 1 mg by mouth 2 (two) times daily as needed for anxiety.     [provider]  FLUoxetine (PROZAC) 20 MG tablet Take 3 tablets (60 mg total) by mouth every morning. Take 3 tablets to = 60 mg qam Patient taking differently: Take 60 mg by mouth daily. 3 tablets  04/22/17   Medina-Vargas, Monina C, NP  ibuprofen (ADVIL,MOTRIN) 600 MG tablet Take 1 tablet (600 mg total) by mouth every 6 (six) hours as needed. 05/10/17   Shary Decamp, PA-C  LUTEIN PO Take 1 capsule by mouth daily.     [provider]  Magnesium 500 MG TABS Take 500 mg by mouth at bedtime.    [provider]  methocarbamol (ROBAXIN) 500 MG tablet Take 1 tablet (500 mg total) by mouth every 12 (twelve) hours as needed for muscle spasms. 11/16/17   Emmelina Mcloughlin A, PA-C  mometasone (NASONEX) 50 MCG/ACT nasal spray Place 2 sprays into the nose daily. 04/22/17   Medina-Vargas, Monina C, NP  omeprazole (PRILOSEC) 40 MG capsule Take 40 mg by mouth daily. 06/22/17   [provider]  ondansetron (ZOFRAN) 8 MG tablet Take 1 tablet (8 mg total) by mouth every 8 (eight) hours as needed for nausea or vomiting. 04/22/17   Medina-Vargas, Monina C, NP  promethazine (PHENERGAN) 25 MG tablet Take 25 mg by mouth every 12 (twelve) hours as needed for nausea or vomiting.    [provider]  RELPAX 40  MG tablet Take 1 tablet (40 mg total) by mouth every 2 (two) hours as needed. Take 1 tablet by mouth as needed for migraine and may take 1 additional tablet in 2 hours if no relief Patient taking differently: Take 40 mg by mouth See admin instructions. Take 1 tablet (40 mg) by mouth as needed for migraine and may take 1 additional tablet (40 mg) in 2 hours if no relief 04/22/17   Medina-Vargas, Monina C, NP  topiramate (TOPAMAX) 100 MG tablet TAKE 1 TABLET BY MOUTH AT BEDTIME START AFTER COMPLETION OF 75 MG DOSE ON 04/29/17 08/26/17   Kathrynn Ducking, MD  trazodone (DESYREL) 300 MG tablet Take 1 tablet (300 mg total) by mouth at bedtime as needed for sleep. 04/22/17   Medina-Vargas, Monina C, NP  vitamin B-12 (CYANOCOBALAMIN) 1000 MCG tablet Take 1 tablet (1,000 mcg total) by mouth daily. 02/19/17   Debbe Odea, MD  vitamin E 100 UNIT capsule Take 100 Units by mouth daily.    [provider]    Family History Family History  Problem Relation Age of Onset  . Lung cancer Mother 22  . Migraines Mother   . Heart attack Father        Vague  . Hypertension Brother   . Esophageal cancer Brother   . Diabetes Paternal Grandmother        questionable  . Colon cancer Neg Hx   . Kidney disease Neg Hx   . Liver disease Neg Hx     Social History Social History   Tobacco Use  . Smoking status: Current Some Day Smoker    Packs/day: 1.00    Years: 34.00    Pack years: 34.00    Types: Cigarettes    Last attempt to quit: 02/03/2017    Years since quitting: 0.7  . Smokeless tobacco: Never Used  . Tobacco comment: 10 per week or less  Substance Use Topics  . Alcohol use: Yes    Alcohol/week: 0.6 oz    Types: 1 Standard drinks or equivalent per week    Comment: Denies 08/24/16  . Drug use: No    Types: Cocaine    Comment: Denies 08/24/16     Allergies   Opana [oxymorphone hcl] and Penicillins   Review of Systems Review of Systems  Constitutional: Negative for chills and  fever.    Musculoskeletal: Positive for arthralgias and back pain.  Neurological: Negative for syncope, weakness and numbness.     Physical Exam Updated Vital Signs BP 128/75 (BP Location: Left Arm)   Pulse 78   Temp 98.5 F (36.9 C) (Oral)   Resp 18   SpO2 100%   Physical Exam  Constitutional: She appears well-developed and well-nourished. No distress.  HENT:  Head: Normocephalic and atraumatic.  Eyes: Conjunctivae are normal. Right eye exhibits no discharge. Left eye exhibits no discharge.  Neck: No JVD present. No tracheal deviation present.  Cardiovascular: Normal rate and intact distal pulses.  2+ DP/PT pulses bilaterally.  Pulmonary/Chest: Effort normal.  Abdominal: She exhibits no distension.  Musculoskeletal: Normal range of motion. She exhibits tenderness. She exhibits no edema.  No midline spine tenderness, diffuse left paralumbar muscle tenderness with no focal tenderness.  No deformity, crepitus, or step-off noted.  She has tenderness to palpation of the left SI joint and left hip laterally with no crepitus or deformity noted.  Normal passive range of motion of the left hip as well as active range of motion.  No pain elicited with range of motion of the left hip passively.  She has a small hematoma overlying the ischial tuberosity along the left buttock.  Examination performed in the presence of a chaperone.  Neurological: She is alert.  Fluent speech, no facial droop, sensation intact to soft touch of bilateral lower extremities.  Ambulates with a mildly antalgic and shuffling gait but able to Heel Walk and Toe Walk without difficulty.  Skin: Skin is warm and dry. No erythema.  Psychiatric: She has a normal mood and affect. Her behavior is normal.  Nursing note and vitals reviewed.    ED Treatments / Results  Labs (all labs ordered are listed, but only abnormal results are displayed) Labs Reviewed - No data to display  EKG None  Radiology Dg Hip Unilat With Pelvis 2-3  Views Left  Result Date: 11/16/2017 CLINICAL DATA:  Pain following fall EXAM: DG HIP (WITH OR WITHOUT PELVIS) 2-3V LEFT COMPARISON:  March 31, 2017 and April 01, 2017 FINDINGS: Frontal pelvis as well as frontal and lateral left hip images were obtained. There is a total hip replacement on the left with prosthetic components well-seated. No acute fracture or dislocation. Right hip joint appears unremarkable. Sacroiliac joints appear unremarkable. IMPRESSION: Total hip replacement on the left with prosthetic components well-seated. No acute fracture or dislocation. Joint spaces appear unremarkable. Electronically Signed   By: Lowella Grip III M.D.   On: 11/16/2017 14:59    Procedures Procedures (including critical care time)  Medications Ordered in ED Medications - No data to display   Initial Impression / Assessment and Plan / ED Course  I have reviewed the triage vital signs and the nursing notes.  Pertinent labs & imaging results that were available during my care of the patient were reviewed by me and considered in my medical decision making (see chart for details).     Patient presents status post 3 mechanical falls 2 days ago.  No aura or prodrome leading up to her falls with no lightheadedness, syncope, shortness of breath.  She is afebrile, vital signs are stable.  She is neurovascularly intact.  She is ambulatory without difficulty despite pain.  I have a low suspicion of fracture given the patient is weightbearing and I also have a low suspicion of lumbar spine injury given no midline spine tenderness.  No red flag signs  concerning for cauda equina or spinal abscess.  We will obtain imaging of the left hip to rule out acute osseous abnormality.  Radiographs show a well-seated hip replacement with no acute fracture or dislocation.  Joint spaces appear unremarkable.  No thrill in a controlled substance registry shows that she received narcotic and benzodiazepine medications on a  regular basis. To the patient that I could not prescribe any narcotic pain medicines for her.  She tells me that she throughout her last prescription due to constipation.  I informed her that all narcotic medications may cause constipation as a side effect.  I did offer her a muscle relaxant and she tells me that she has taken Soma in the past.  I offered her Flexeril and she told me that this medication makes her  jittery but she states she can tolerate Robaxin.  Advised of the side effects of these medications.  Recommend Tylenol, ibuprofen, heat, ice, and gentle stretching.  I recommend follow-up with her primary care physician or her orthopedist who performed her hip replacement within 1 week.  Discussed strict ED return precautions. Pt verbalized understanding of and agreement with plan and is safe for discharge home at this time.   Final Clinical Impressions(s) / ED Diagnoses   Final diagnoses:  Acute left-sided low back pain without sciatica  Acute pain of left hip  Fall, initial encounter    ED Discharge Orders        Ordered    methocarbamol (ROBAXIN) 500 MG tablet  Every 12 hours PRN     11/16/17 1515       Keng Jewel, [place] A, PA-C 11/16/17 1518

## 2017-11-16 NOTE — Discharge Instructions (Addendum)
Your x-rays did not show any sign of damage to the hip replacement or bony abnormalities.  Alternate 600 mg of ibuprofen and [phone] mg of Tylenol every 3 hours as needed for pain. Do not exceed 4000 mg of Tylenol daily.  Take ibuprofen with food to avoid upset stomach issues.  You may take Robaxin as needed for muscle relaxation but do not drive, drink alcohol, or operate heavy machinery as these medications may make you drowsy.  Apply ice or heat for comfort.  Do some gentle stretching to avoid muscle stiffness.  You may use a cane to help you a walk around for the next few days.  Follow-up with your primary care physician or orthopedist in the next week for reevaluation of your symptoms.  Return to the emergency department if any concerning signs or symptoms develop.

## 2017-11-17 ENCOUNTER — Emergency Department (HOSPITAL_COMMUNITY)
Admission: EM | Admit: 2017-11-17 | Discharge: 2017-11-17 | Disposition: A | Discharge status: Home / Self Care | Payer: Medicare Other | Attending: Emergency Medicine | Admitting: Emergency Medicine

## 2017-11-17 ENCOUNTER — Encounter (HOSPITAL_COMMUNITY): Payer: Self-pay

## 2017-11-17 DIAGNOSIS — R197 Diarrhea, unspecified: Secondary | ICD-10-CM | POA: Insufficient documentation

## 2017-11-17 DIAGNOSIS — R42 Dizziness and giddiness: Secondary | ICD-10-CM | POA: Diagnosis present

## 2017-11-17 DIAGNOSIS — Z5321 Procedure and treatment not carried out due to patient leaving prior to being seen by health care provider: Secondary | ICD-10-CM | POA: Diagnosis not present

## 2017-11-17 DIAGNOSIS — W19XXXA Unspecified fall, initial encounter: Secondary | ICD-10-CM | POA: Diagnosis not present

## 2017-11-17 LAB — BASIC METABOLIC PANEL
Anion gap: 10 (ref 5–15)
BUN: 11 mg/dL (ref 6–20)
CALCIUM: 8.6 mg/dL — AB (ref 8.9–10.3)
CO2: 20 mmol/L — ABNORMAL LOW (ref 22–32)
CREATININE: 0.89 mg/dL (ref 0.44–1.00)
Chloride: 109 mmol/L (ref 101–111)
GFR calc Af Amer: >60 mL/min (ref >60)
GFR calc non Af Amer: >60 mL/min (ref >60)
GLUCOSE: 88 mg/dL (ref 65–99)
Potassium: 3.7 mmol/L (ref 3.5–5.1)
Sodium: 139 mmol/L (ref 135–145)

## 2017-11-17 LAB — CBC
HCT: 38.8 % (ref 36.0–46.0)
HEMOGLOBIN: 12.1 g/dL (ref 12.0–15.0)
MCH: 27.9 pg (ref 26.0–34.0)
MCHC: 31.2 g/dL (ref 30.0–36.0)
MCV: 89.6 fL (ref 78.0–100.0)
PLATELETS: 224 10*3/uL (ref 150–400)
RBC: 4.33 MIL/uL (ref 3.87–5.11)
RDW: 14.3 % (ref 11.5–15.5)
WBC: 5.4 10*3/uL (ref 4.0–10.5)

## 2017-11-17 NOTE — ED Triage Notes (Signed)
To triage via EMS.  Pt from home.  Onset 3 days ago pt reports she fell x 3 while getting up to go to BR.  Pt was seen at [facility] yesterday for lower back pain, left hip pain, buttock pain.  Onset yesterday after left [facility] pt started having nausea and diarrhea x 15 today, watery, dark brown.  Pt reports she gets dizzy while standing.  EMS gave Zofran 4 mg.   Per EMS BP while sitting 100/60, standing 80/40.

## 2017-11-17 NOTE — ED Notes (Signed)
Pt ambulated up to NF, reports she is leaving, does not want to wait, threw her armband and fall risk band at me then walked back in lobby and sat down.

## 2017-11-17 NOTE — ED Notes (Signed)
Pt has come up to this tech twice now upset about her wait time. Pt stated she has not been here only 2 hrs and that she has been here since 2pm. Pt has stated multiple times that she is leaving but has continuously sat back down in the waiting room. Pt has taken off her name arm band.

## 2017-12-17 ENCOUNTER — Ambulatory Visit: Payer: Medicare Other | Admitting: Neurology

## 2017-12-17 ENCOUNTER — Encounter: Payer: Self-pay | Admitting: Neurology

## 2017-12-17 ENCOUNTER — Telehealth: Payer: Self-pay | Admitting: Neurology

## 2017-12-17 ENCOUNTER — Other Ambulatory Visit: Payer: Self-pay

## 2017-12-17 VITALS — BP 109/80 | HR 82 | Wt 113.5 lb

## 2017-12-17 DIAGNOSIS — R29818 Other symptoms and signs involving the nervous system: Secondary | ICD-10-CM | POA: Diagnosis not present

## 2017-12-17 DIAGNOSIS — G8929 Other chronic pain: Secondary | ICD-10-CM

## 2017-12-17 DIAGNOSIS — R5382 Chronic fatigue, unspecified: Secondary | ICD-10-CM | POA: Diagnosis not present

## 2017-12-17 DIAGNOSIS — R269 Unspecified abnormalities of gait and mobility: Secondary | ICD-10-CM

## 2017-12-17 DIAGNOSIS — M5442 Lumbago with sciatica, left side: Secondary | ICD-10-CM | POA: Diagnosis not present

## 2017-12-17 DIAGNOSIS — G43419 Hemiplegic migraine, intractable, without status migrainosus: Secondary | ICD-10-CM

## 2017-12-17 MED ORDER — TOPIRAMATE 50 MG PO TABS: 50.0000 mg | ORAL_TABLET | Freq: Every day | ORAL | 1 refills | Status: DC

## 2017-12-17 NOTE — Progress Notes (Signed)
Reason for visit: Gait disturbance  Referring physician: Dr. Charolotte Capuchin is a 67 y.o. female  History of present illness:  Dorothy White is a 67 year old right-handed white female with a history of migraine headache with hemiplegic features.  The patient has done fairly well with her migraines on Topamax taking 100 mg at night.  She is having about 1 headache a month, she is controlling the headaches fairly well.  She does have chronic low back pain, she does have sciatica pain down the left leg to the knee.  She has had ongoing left hip pain following a hip fracture in September 2018.  Within the last 3 or 4 months, she has had some gradual worsening of her ability to ambulate.  She has fallen on multiple occasions, she last fell yesterday.  She fell 6 times in April.  The patient is now using a cane for ambulation.  The patient is having increasing problems with sleepiness and lethargy, she has had about 6 pounds of weight loss over the last 6 months.  She denies any problems controlling the bowels or the bladder.  She denies any actual weakness of the extremities.  She reports some numbness at times in the legs, left greater than right.  She returns to this office for further evaluation.  On the last visit, she was noted to have some decreased arm swing on the left, the possibility of a parkinsonian syndrome was felt possible.  Past Medical History:  Diagnosis Date  . Anemia    ?  Marland Kitchen Anxiety   . Arthritis    "everywhere" (04/25/2016)  . 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 Chronic back pain    "all over" (04/25/2016)  . Constipation   . Depression   . Diverticulitis   . GERD (gastroesophageal reflux disease)   . Headache    "weekly" (04/25/2016)  . 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 HLD (hyperlipidemia)    hx (04/25/2016)  . 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 Mild CAD    a. 25% mLAD, otherwise no sig  disease 01/2016 cath.  Marland Kitchen NICM (nonischemic cardiomyopathy) ([place])    a. EF 40-45% by echo 09/863 at time of complicated migraine/neuro sx, 55-65% at time of cath 01/2016  . Stroke [facility]) 06/2013   "mini" stroke , ?possibly hemaplegic migraine per pt    Past Surgical History:  Procedure Laterality Date  . ANTERIOR APPROACH HEMI HIP ARTHROPLASTY Left 04/01/2017   Procedure: LEFT DIRECT ANTERIOR TOTAL HIP REPLACEMENT;  Surgeon: Leandrew Koyanagi, MD;  Location: [place];  Service: Orthopedics;  Laterality: Left;  LEFT DIRECT ANTERIOR TOTAL HIP REPLACEMENT  . AUGMENTATION MAMMAPLASTY  1980  . BASAL CELL CARCINOMA EXCISION     "tip of my nose"  . BUNIONECTOMY Bilateral 10/2003  . CARDIAC CATHETERIZATION N/A 02/20/2016   Procedure: Left Heart Cath and Coronary Angiography;  Surgeon: Jettie Booze, MD;  Location: [place] [facility];  Service: Cardiovascular;  Laterality: N/A;  . COLONOSCOPY    . DILATION AND CURETTAGE OF UTERUS    . FRACTURE SURGERY    . IR GENERIC HISTORICAL  03/18/2016   IR SINUS/FIST TUBE CHK-NON GI 03/18/2016 Aletta Edouard, MD [facility]-INTERV RAD  . LAPAROSCOPIC SIGMOID COLECTOMY N/A 04/25/2016   Procedure: LAPAROSCOPIC SIGMOID COLECTOMY;  Surgeon: Stark Klein, MD;  Location: [place];  Service: General;  Laterality: N/A;  . OOPHORECTOMY Bilateral ~ 1999  .  ORIF HUMERUS FRACTURE Right 02/15/2015   Procedure: OPEN REDUCTION INTERNAL FIXATION (ORIF) PROXIMAL HUMERUS FRACTURE;  Surgeon: Netta Cedars, MD;  Location: [place];  Service: Orthopedics;  Laterality: Right;  . RHINOPLASTY  1976  . TONSILLECTOMY    . TUBAL LIGATION  ~ 1983  . VAGINAL HYSTERECTOMY  ~ 1998    Family History  Problem Relation Age of Onset  . Lung cancer Mother 8  . Migraines Mother   . Heart attack Father        Vague  . Hypertension Brother   . Esophageal cancer Brother   . Diabetes Paternal Grandmother        questionable  . Colon cancer Neg Hx   . Kidney disease Neg Hx   . Liver disease Neg Hx      Social history:  reports that she has been smoking cigarettes.  She has a 34.00 pack-year smoking history. She has never used smokeless tobacco. She reports that she drinks about 0.6 oz of alcohol per week. She reports that she does not use drugs.  Medications:  Prior to Admission medications   Medication Sig Start Date End Date Taking? Authorizing Provider  acetaminophen (TYLENOL) 325 MG tablet Take 975 mg by mouth every 6 (six) hours as needed for headache (pain).    Yes [provider]  aspirin EC 81 MG tablet Take 81 mg by mouth every other day.   Yes [provider]  CALCIUM PO Take 1 tablet by mouth daily with lunch.   Yes [provider]  cholecalciferol (VITAMIN D) 1000 units tablet Take 1,000 Units by mouth daily.   Yes [provider]  clonazePAM (KLONOPIN) 1 MG tablet Take 1 mg by mouth 2 (two) times daily as needed for anxiety.    Yes [provider]  FLUoxetine (PROZAC) 20 MG tablet Take 3 tablets (60 mg total) by mouth every morning. Take 3 tablets to = 60 mg qam Patient taking differently: Take 40 mg by mouth daily. 2 tablets 04/22/17  Yes Medina-Vargas, Monina C, NP  ibuprofen (ADVIL,MOTRIN) 600 MG tablet Take 1 tablet (600 mg total) by mouth every 6 (six) hours as needed. 05/10/17  Yes Shary Decamp, PA-C  LUTEIN PO Take 1 capsule by mouth daily.    Yes [provider]  Magnesium 500 MG TABS Take 500 mg by mouth at bedtime.   Yes [provider]  mometasone (NASONEX) 50 MCG/ACT nasal spray Place 2 sprays into the nose daily. 04/22/17  Yes Medina-Vargas, Monina C, NP  omeprazole (PRILOSEC) 40 MG capsule Take 40 mg by mouth daily. 06/22/17  Yes [provider]  ondansetron (ZOFRAN) 8 MG tablet Take 1 tablet (8 mg total) by mouth every 8 (eight) hours as needed for nausea or vomiting. 04/22/17  Yes Medina-Vargas, Monina C, NP  promethazine (PHENERGAN) 25 MG tablet Take 25 mg by mouth every 12 (twelve) hours as  needed for nausea or vomiting.   Yes [provider]  SUMATRIPTAN SUCCINATE PO Take 1 Dose by mouth as needed.   Yes [provider]  topiramate (TOPAMAX) 100 MG tablet TAKE 1 TABLET BY MOUTH AT BEDTIME START AFTER COMPLETION OF 75 MG DOSE ON 04/29/17 08/26/17  Yes Kathrynn Ducking, MD  trazodone (DESYREL) 300 MG tablet Take 1 tablet (300 mg total) by mouth at bedtime as needed for sleep. 04/22/17  Yes Medina-Vargas, Monina C, NP  vitamin B-12 (CYANOCOBALAMIN) 1000 MCG tablet Take 1 tablet (1,000 mcg total) by mouth daily. 02/19/17  Yes Debbe Odea, MD  vitamin E 100 UNIT capsule Take 100 Units by mouth daily.   Yes [provider]      Allergies  Allergen Reactions  . Opana [Oxymorphone Hcl] Other (See Comments)    hallucinations  . Penicillins Hives    Has patient had a PCN reaction causing immediate rash, facial/tongue/throat swelling, SOB or lightheadedness with hypotension: Yes Has patient had a PCN reaction causing severe rash involving mucus membranes or skin necrosis: No Has patient had a PCN reaction that required hospitalization No Has patient had a PCN reaction occurring within the last 10 years: No If all of the above answers are "NO", then may proceed with Cephalosporin use.     ROS:  Out of a complete 14 system review of symptoms, the patient complains only of the following symptoms, and all other reviewed systems are negative.  Weight loss, fatigue Ringing in the ears Easy bruising Feeling cold Joint pain Depression, anxiety, change in appetite, disinterest in activities  Blood pressure 109/80, pulse 82, weight 113 lb 8 oz (51.5 kg), SpO2 98 %.   Blood pressure, right arm, sitting is 132/84.  Blood pressure, right arm, standing is 138/90.  Physical Exam  General: The patient is alert and cooperative at the time of the examination.  Affect is slightly flat.  Eyes: Pupils are equal, round, and reactive to light. Discs are flat  bilaterally.  Neck: The neck is supple, no carotid bruits are noted.  Respiratory: The respiratory examination is clear.  Cardiovascular: The cardiovascular examination reveals a regular rate and rhythm, no obvious murmurs or rubs are noted.  Skin: Extremities are without significant edema.  Neurologic Exam  Mental status: The patient is alert and oriented x 3 at the time of the examination. The patient has apparent normal recent and remote memory, with an apparently normal attention span and concentration ability.  Cranial nerves: Facial symmetry is present. There is decreased pinprick sensation on the left face as compared to the right. The strength of the facial muscles and the muscles to head turning and shoulder shrug are normal bilaterally. Speech is well enunciated, no aphasia or dysarthria is noted. Extraocular movements are full, with exception some restriction of superior gaze. Visual fields are full. The tongue is midline, and the patient has symmetric elevation of the soft palate. No obvious hearing deficits are noted.  Motor: The motor testing reveals 5 over 5 strength of all 4 extremities. Good symmetric motor tone is noted throughout.  Sensory: Sensory testing is intact to pinprick, soft touch, vibration sensation, and position sense on all 4 extremities, with exception that there is a stocking pattern pinprick sensory deficit up to the knees bilaterally, decreased pin prick sensation on the left arm relative to the right. No evidence of extinction is noted.  Coordination: Cerebellar testing reveals good finger-nose-finger and heel-to-shin bilaterally.  Gait and station: Gait is minimally wide-based, slightly unsteady, the patient appears to have arm swing bilaterally today.  Tandem gait is slightly unsteady.  Romberg is negative. No drift is seen.  Reflexes: Deep tendon reflexes are symmetric and normal bilaterally.  The ankle jerk reflexes are well-maintained bilaterally.   Toes are downgoing bilaterally.   Assessment/Plan:  1.  Progressive gait disorder  2.  Hemiplegic migraine  3.  Chronic low back pain, left leg pain  The patient has noted a change in her ability to ambulate over the last several months.  She does not have true features of Parkinson's disease today.  She does report some weight loss, with increased drowsiness and fatigue.  She is on clonazepam, but she states that she only takes this occasionally, not on a daily basis.  The patient will be set up for MRI of the brain given the left hemisensory deficit and she will have MRI of the low back given the history of back pain and left leg pain and change in gait.  The patient will be set up for physical therapy for gait training, she will have blood work done today.  Given the ongoing weight loss, she will be reduced on her Topamax dose to 50 mg at night.  A prescription was sent in.  She will follow-up in 4 months.  Jill Alexanders MD 12/17/2017 11:33 AM  [facility] [address] [place], [place] [address]  Phone [phone] Fax [phone]

## 2017-12-17 NOTE — Patient Instructions (Addendum)
   We will get physical therapy and reduce the Topamax dose to 50 mg at night.

## 2017-12-17 NOTE — Telephone Encounter (Signed)
[employer] order sent to [facility]. No auth they will reach out to the pt to schedule.  °

## 2017-12-18 LAB — RPR: RPR: NONREACTIVE

## 2017-12-18 LAB — TSH: TSH: 0.72 u[IU]/mL (ref 0.450–4.500)

## 2017-12-18 LAB — VITAMIN B12: VITAMIN B 12: 1760 pg/mL — AB (ref 232–1245)

## 2017-12-18 LAB — SEDIMENTATION RATE: Sed Rate: 11 mm/hr (ref 0–40)

## 2017-12-24 ENCOUNTER — Ambulatory Visit (INDEPENDENT_AMBULATORY_CARE_PROVIDER_SITE_OTHER): Payer: PPO | Admitting: Orthopaedic Surgery

## 2017-12-28 ENCOUNTER — Telehealth: Payer: Self-pay | Admitting: Neurology

## 2017-12-28 MED ORDER — TOPIRAMATE 100 MG PO TABS
100.0000 mg | ORAL_TABLET | Freq: Every day | ORAL | 3 refills | Status: DC
Start: 2017-12-28 — End: 2021-03-21

## 2017-12-28 NOTE — Addendum Note (Signed)
Addended by: Kathrynn Ducking on: 12/28/2017 12:59 PM   Modules accepted: Orders

## 2017-12-28 NOTE — Telephone Encounter (Signed)
I called the patient.  She is doing poorly on a lower dose of Topamax, will go back to the 100 mg tablets, she will need to follow her weight closely, if she continues to lose weight we will need to switch over to something else such as Depakote.

## 2017-12-28 NOTE — Telephone Encounter (Signed)
Pt states since her topiramate (TOPAMAX)  Was cut down to 50 mg her head aches have come back.  Pt would like to know if she can be increased back to 100mg .  Please call

## 2017-12-30 ENCOUNTER — Ambulatory Visit
Admission: RE | Admit: 2017-12-30 | Discharge: 2017-12-30 | Disposition: A | Discharge status: Home / Self Care | Payer: PPO | Source: Ambulatory Visit | Attending: Neurology | Admitting: Neurology

## 2017-12-30 DIAGNOSIS — R269 Unspecified abnormalities of gait and mobility: Secondary | ICD-10-CM | POA: Diagnosis not present

## 2017-12-30 DIAGNOSIS — G8929 Other chronic pain: Secondary | ICD-10-CM

## 2017-12-30 DIAGNOSIS — M5442 Lumbago with sciatica, left side: Secondary | ICD-10-CM

## 2017-12-30 DIAGNOSIS — R29818 Other symptoms and signs involving the nervous system: Secondary | ICD-10-CM

## 2017-12-31 ENCOUNTER — Telehealth: Payer: Self-pay | Admitting: Neurology

## 2017-12-31 DIAGNOSIS — R269 Unspecified abnormalities of gait and mobility: Secondary | ICD-10-CM

## 2017-12-31 NOTE — Telephone Encounter (Signed)
I will go ahead and get her started on physical therapy.

## 2017-12-31 NOTE — Addendum Note (Signed)
Addended by: Kathrynn Ducking on: 12/31/2017 05:12 PM   Modules accepted: Orders

## 2017-12-31 NOTE — Telephone Encounter (Signed)
I called the patient.  MRI of the brain and lumbar spine are relatively unremarkable, nothing explains a difficulty with walking.  If patient wishes to undergo physical therapy for walking, she is to contact our office.   MRI brain 12/31/17:  IMPRESSION:  Slightly abnormal MRI scan of the brain showing mild age disproportionate supratentorial cortical atrophy and age-appropriate changes of chronic microvascular ischemia. No acute abnormality is noted   MRI lumbar 12/31/17:  IMPRESSION:  Abnormal MRI scan of the lumbar spine showing prominent spondylitic and facet degenerative changes throughout most prominent at L2-3 with a left-sided foraminal narrowing but without significant compression.

## 2017-12-31 NOTE — Telephone Encounter (Signed)
Pt returning Dr. Jannifer Franklin call, stating she would like to move forward with PT.

## 2018-01-08 ENCOUNTER — Other Ambulatory Visit: Payer: Self-pay | Admitting: Neurology

## 2018-01-08 DIAGNOSIS — G43419 Hemiplegic migraine, intractable, without status migrainosus: Secondary | ICD-10-CM

## 2018-01-14 IMAGING — CT CT HEAD W/O CM
2 series · 17 of 30 positions shown, 20 images · non-contrast
Comparison: Head CT 07/09/2015

CLINICAL DATA: Right-sided weakness.  Headache and dizziness.

EXAM:
CT HEAD WITHOUT CONTRAST
TECHNIQUE: Contiguous axial images were obtained from the base of the skull
through the vertex without intravenous contrast.

[Series 2: head w/o · axial · non-contrast · 0.40mm/px · z∈[+1498,+1618]mm · 9 of 30 slices shown, 12 images]
[im 3/30  brain]
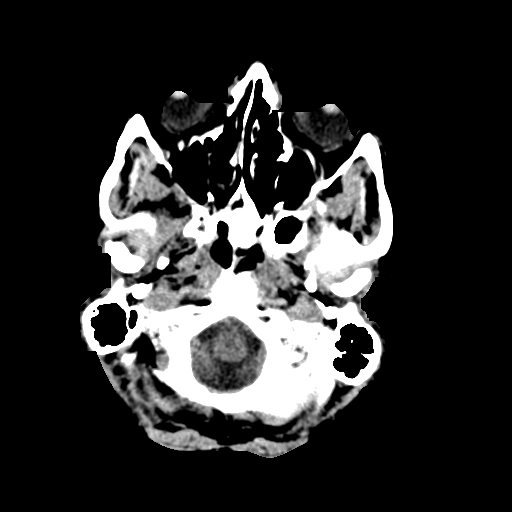
[im 3/30  bone]
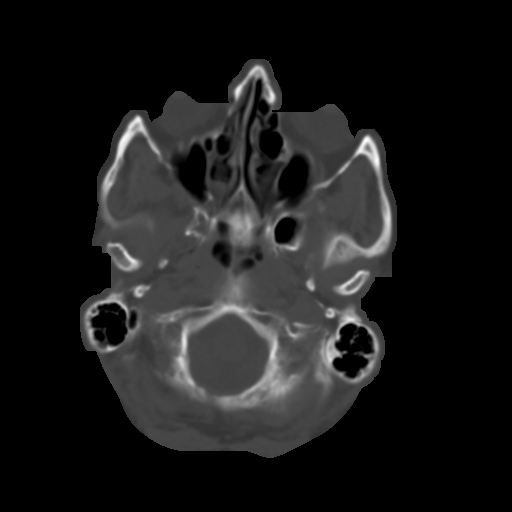
[im 6/30  brain]
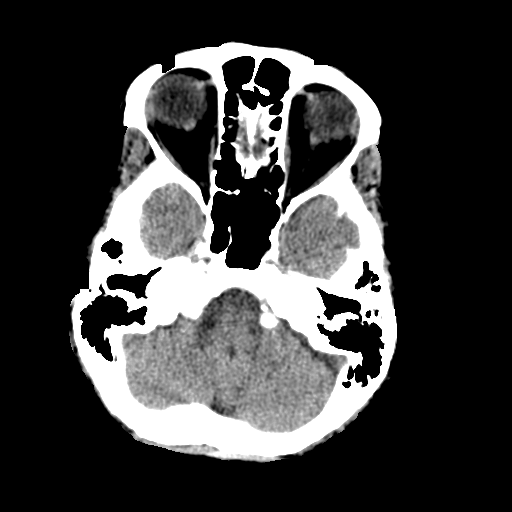
[im 9/30  brain]
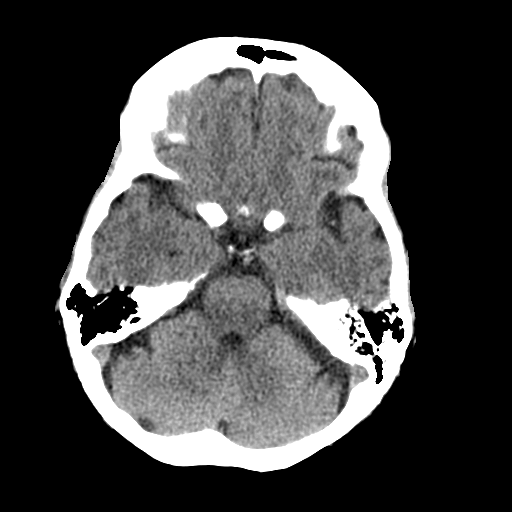
[im 12/30  brain]
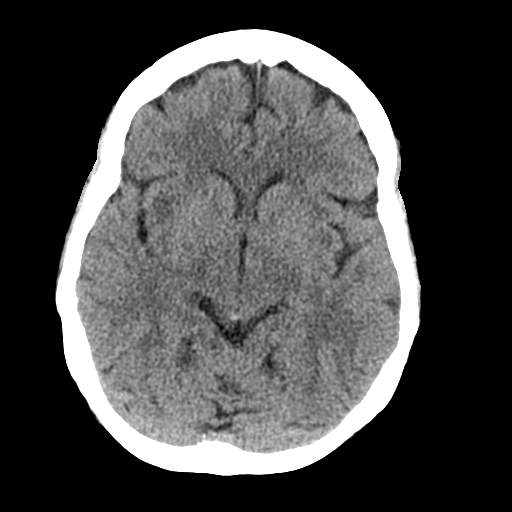
[im 15/30  brain]
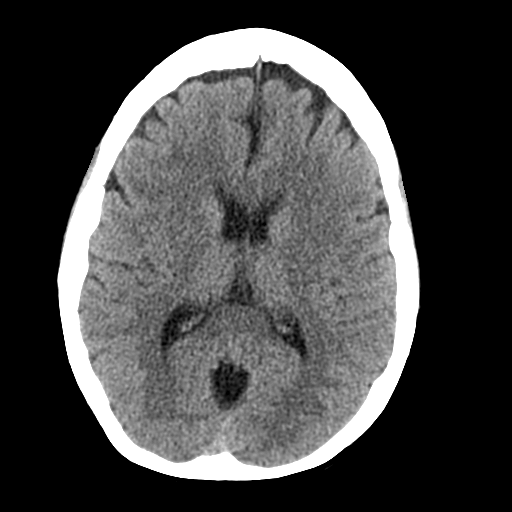
[im 15/30  bone]
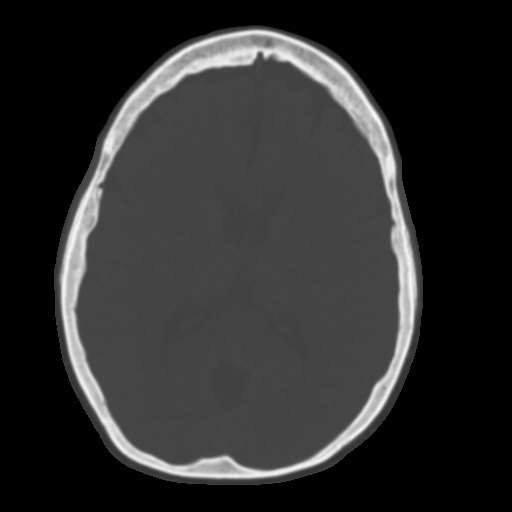
[im 18/30  brain]
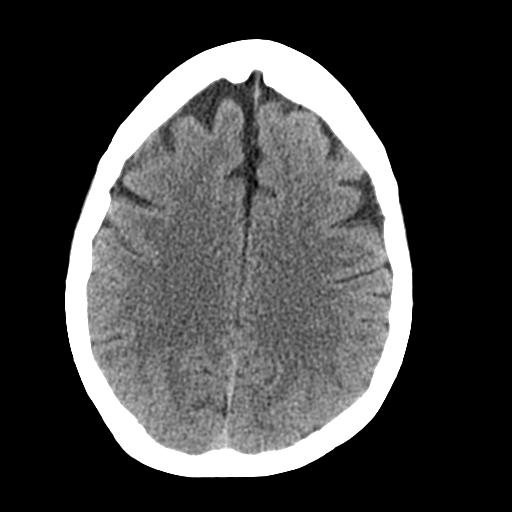
[im 21/30  brain]
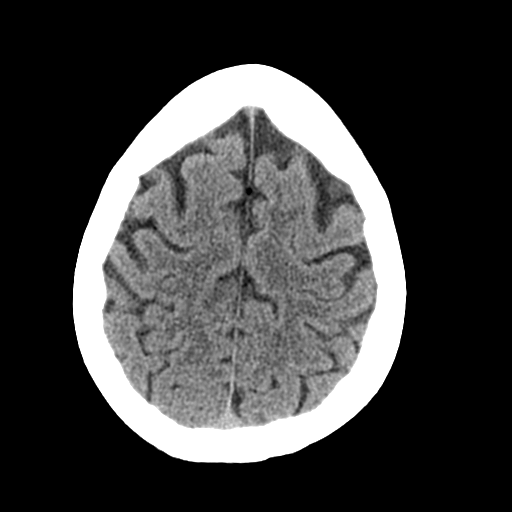
[im 24/30  brain]
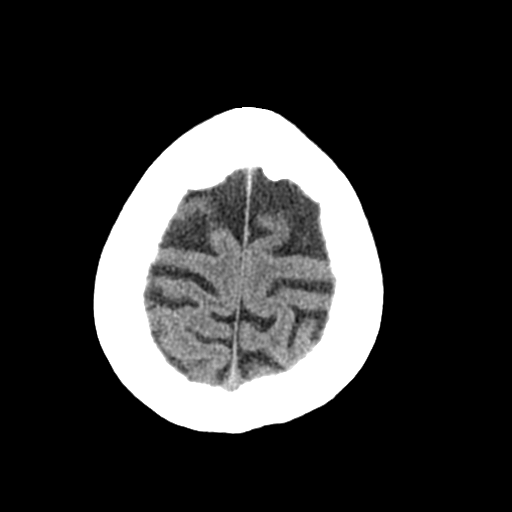
[im 27/30  brain]
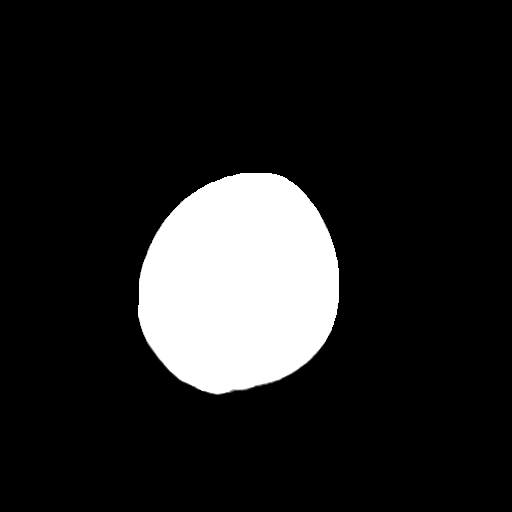
[im 27/30  bone]
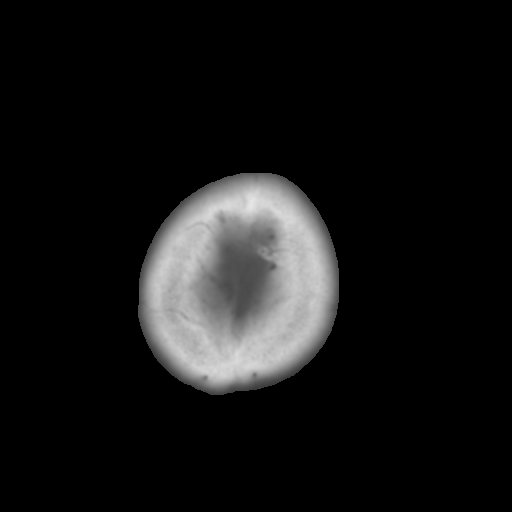

[Series 3: bone windows · axial · 0.40mm/px · z∈[+1503,+1614]mm · 8 of 49 slices shown]
[im 6/49  bone]
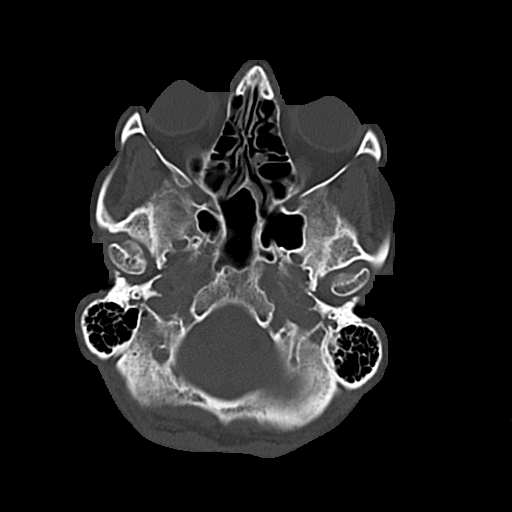
[im 11/49  bone]
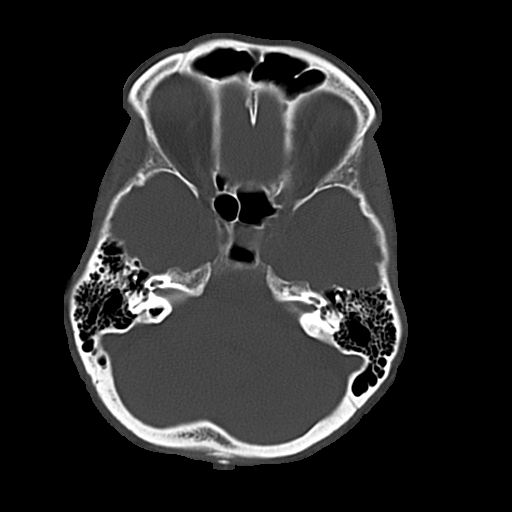
[im 17/49  bone]
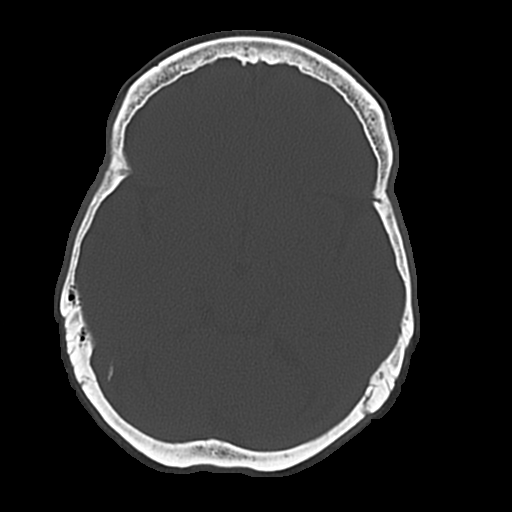
[im 22/49  bone]
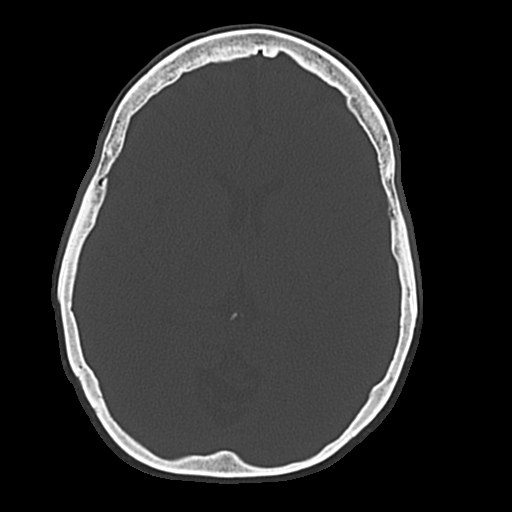
[im 27/49  bone]
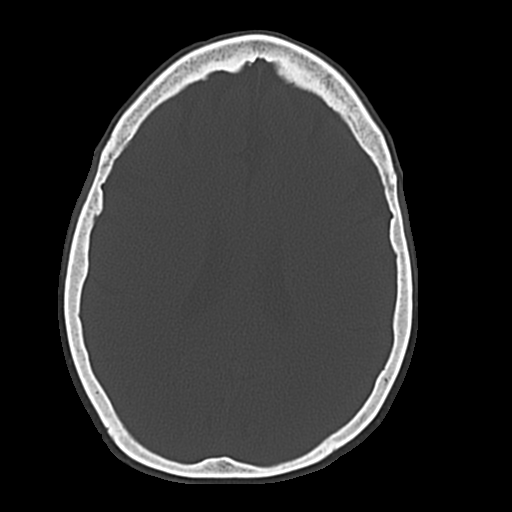
[im 33/49  bone]
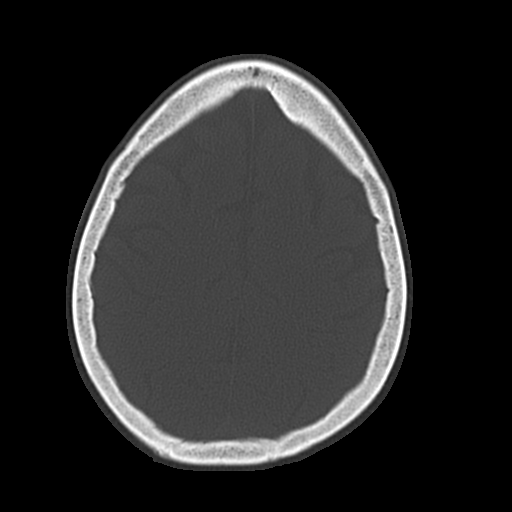
[im 38/49  bone]
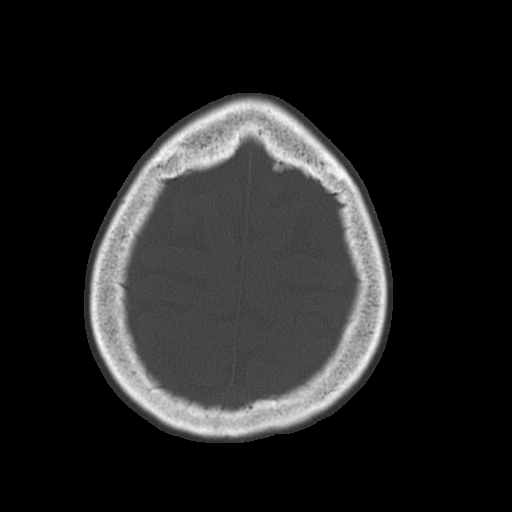
[im 43/49  bone]
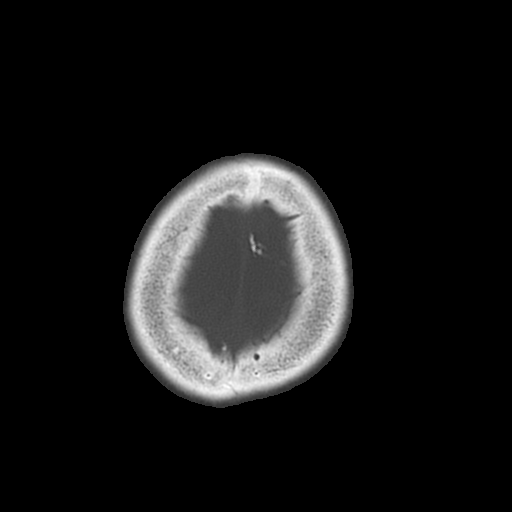

[17 of 30 positions shown; findings below may reference images not displayed]

FINDINGS: No intracranial hemorrhage, mass effect, or midline shift. No
hydrocephalus. The basilar cisterns are patent. No evidence of
territorial infarct. Stable degree of chronic small vessel ischemia.
No intracranial fluid collection. Calvarium is intact. Included
paranasal sinuses and mastoid air cells are well aerated.
IMPRESSION: No acute intracranial abnormality. Stable mild chronic small vessel
ischemia.

## 2018-01-15 ENCOUNTER — Encounter (HOSPITAL_COMMUNITY): Payer: Self-pay | Admitting: *Deleted

## 2018-01-15 ENCOUNTER — Emergency Department (HOSPITAL_COMMUNITY)
Admission: EM | Admit: 2018-01-15 | Discharge: 2018-01-15 | Disposition: A | Discharge status: Home / Self Care | Payer: Medicare Other | Attending: Emergency Medicine | Admitting: Emergency Medicine

## 2018-01-15 ENCOUNTER — Emergency Department (HOSPITAL_COMMUNITY)
Admission: EM | Admit: 2018-01-15 | Discharge: 2018-01-15 | Disposition: A | Discharge status: Home / Self Care | Payer: Medicare Other | Source: Home / Self Care | Attending: Emergency Medicine | Admitting: Emergency Medicine

## 2018-01-15 ENCOUNTER — Encounter (HOSPITAL_COMMUNITY): Payer: Self-pay | Admitting: Emergency Medicine

## 2018-01-15 DIAGNOSIS — F17210 Nicotine dependence, cigarettes, uncomplicated: Secondary | ICD-10-CM | POA: Insufficient documentation

## 2018-01-15 DIAGNOSIS — Z7982 Long term (current) use of aspirin: Secondary | ICD-10-CM | POA: Diagnosis not present

## 2018-01-15 DIAGNOSIS — F419 Anxiety disorder, unspecified: Secondary | ICD-10-CM | POA: Diagnosis not present

## 2018-01-15 DIAGNOSIS — M25552 Pain in left hip: Secondary | ICD-10-CM | POA: Diagnosis present

## 2018-01-15 DIAGNOSIS — Z8673 Personal history of transient ischemic attack (TIA), and cerebral infarction without residual deficits: Secondary | ICD-10-CM | POA: Insufficient documentation

## 2018-01-15 DIAGNOSIS — R296 Repeated falls: Secondary | ICD-10-CM | POA: Diagnosis not present

## 2018-01-15 DIAGNOSIS — Z79899 Other long term (current) drug therapy: Secondary | ICD-10-CM

## 2018-01-15 DIAGNOSIS — F329 Major depressive disorder, single episode, unspecified: Secondary | ICD-10-CM | POA: Insufficient documentation

## 2018-01-15 DIAGNOSIS — I2510 Atherosclerotic heart disease of native coronary artery without angina pectoris: Secondary | ICD-10-CM | POA: Diagnosis not present

## 2018-01-15 DIAGNOSIS — Z85828 Personal history of other malignant neoplasm of skin: Secondary | ICD-10-CM | POA: Diagnosis not present

## 2018-01-15 DIAGNOSIS — G8929 Other chronic pain: Secondary | ICD-10-CM

## 2018-01-15 LAB — URINALYSIS, ROUTINE W REFLEX MICROSCOPIC
Bilirubin Urine: NEGATIVE
Glucose, UA: NEGATIVE mg/dL
Hgb urine dipstick: NEGATIVE
Ketones, ur: NEGATIVE mg/dL
Leukocytes, UA: NEGATIVE
Nitrite: NEGATIVE
Protein, ur: NEGATIVE mg/dL
Specific Gravity, Urine: 1.016 (ref 1.005–1.030)
pH: 7 (ref 5.0–8.0)

## 2018-01-15 IMAGING — MR MR HEAD W/O CM
9 of 11 series · 32 of 48 positions shown · non-contrast
Comparison: Prior CT from 12/17/2015.

CLINICAL DATA: Initial evaluation for acute headache with ataxia.

EXAM:
MRI HEAD WITHOUT CONTRAST
MRA HEAD WITHOUT CONTRAST
TECHNIQUE: Multiplanar, multiecho pulse sequences of the brain and surrounding
structures were obtained without intravenous contrast. Angiographic
images of the head were obtained using MRA technique without
contrast.

[Series 3: T1 · sagittal · 5.0mm · 0.47mm/px · 1 of 23 slices shown]
[im 1/23]
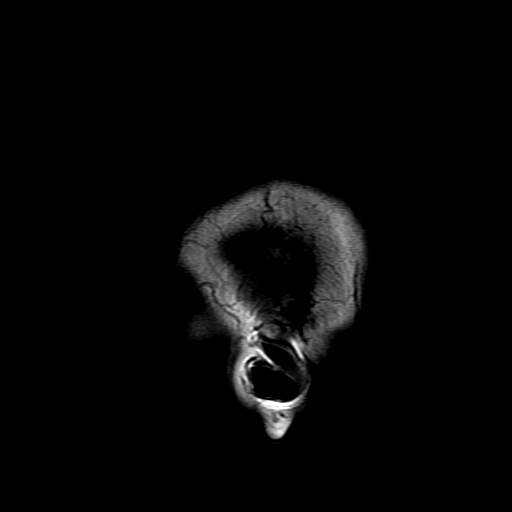

[Series 4: DWI · axial · 3.0mm · 1.09mm/px · z∈[-88,+50]mm · 8 of 94 slices shown (1 of 4)]
[im 1/94]
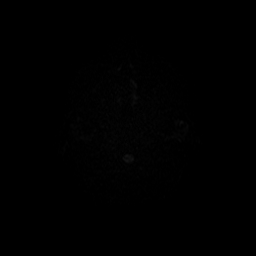
[im 14/94]
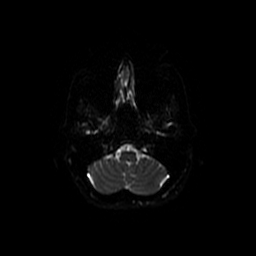
[im 27/94]
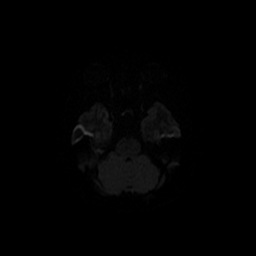
[im 40/94]
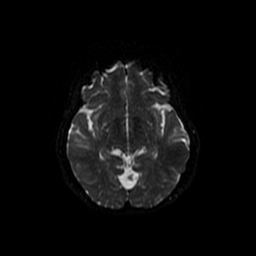
[im 54/94]
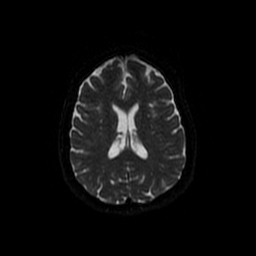
[im 67/94]
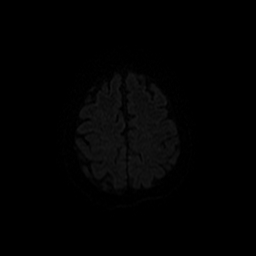
[im 80/94]
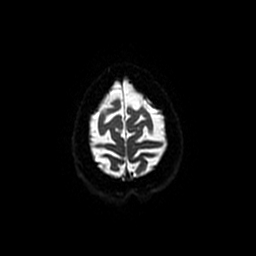
[im 94/94]
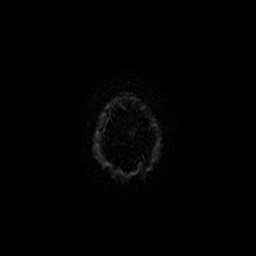

[Series 5: (id) mt fs · axial · 1.4mm · 0.43mm/px · z∈[-64,-8]mm · 5 of 136 slices shown]
[im 1/136]
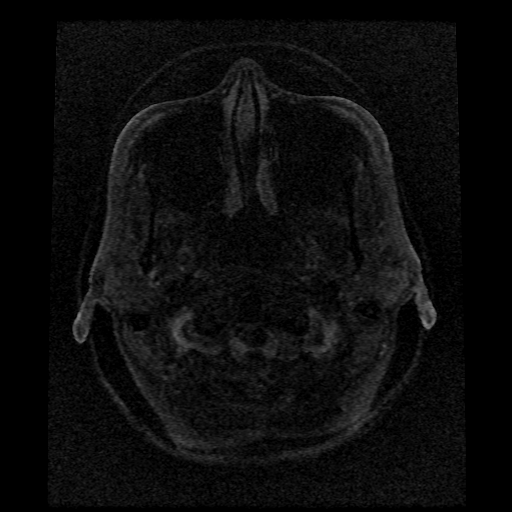
[im 28/136]
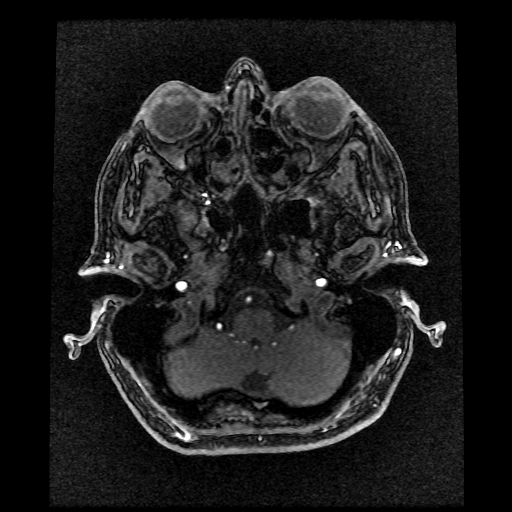
[im 41/136]
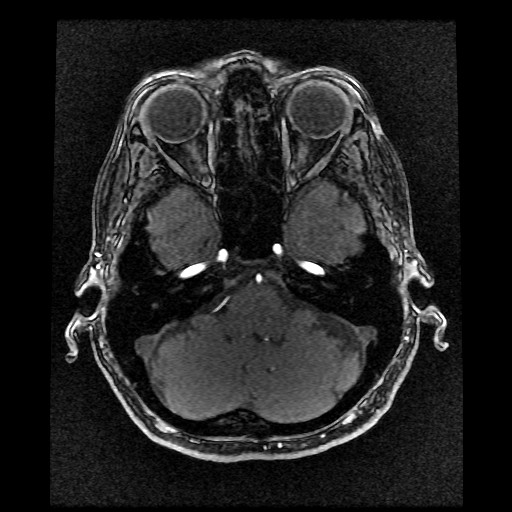
[im 55/136]
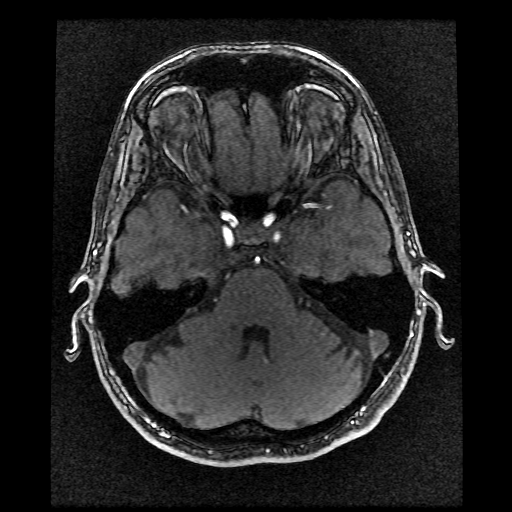
[im 82/136]
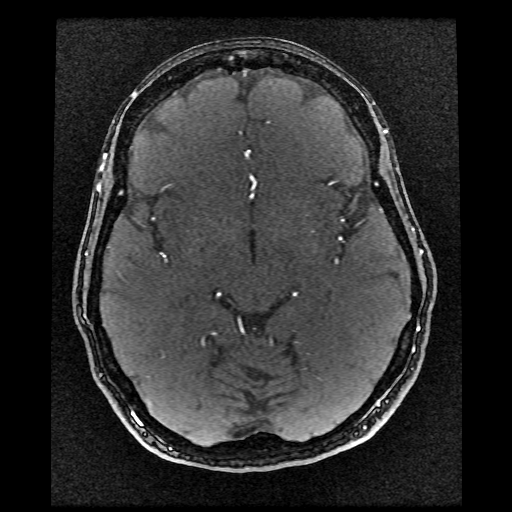

[Series 6: T2 · axial · 5.0mm · 0.43mm/px · z∈[-86,+51]mm · 2 of 24 slices shown]
[im 1/24]
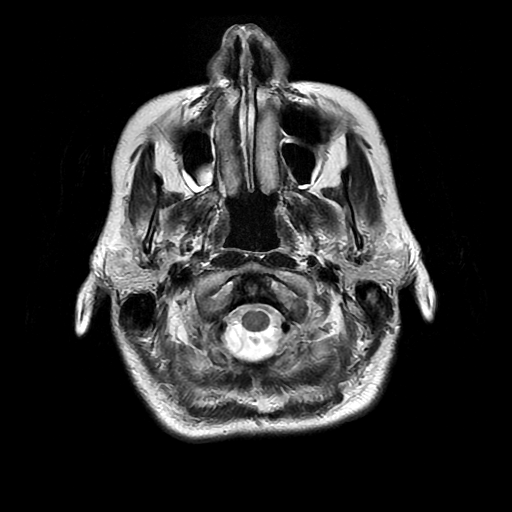
[im 24/24]
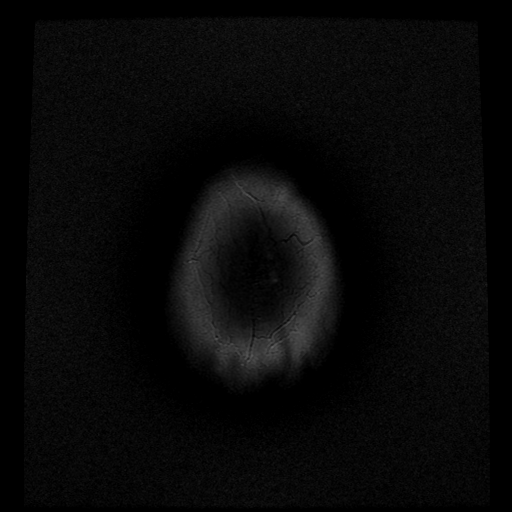

[Series 7: FLAIR · axial · 5.0mm · 0.43mm/px · z∈[-86,+51]mm · 2 of 24 slices shown]
[im 1/24]
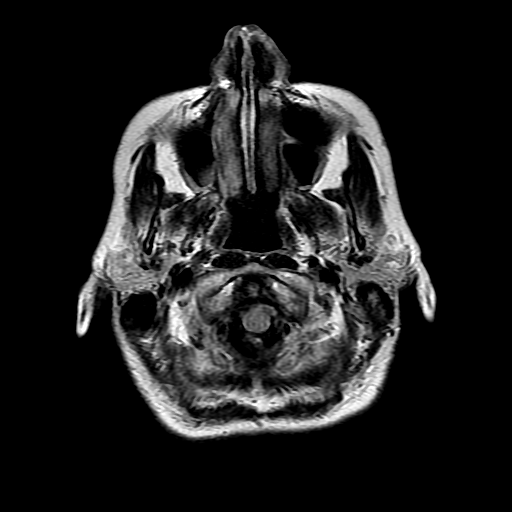
[im 24/24]
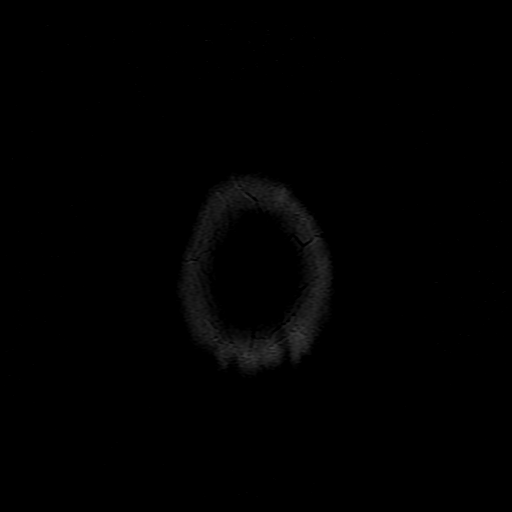

[Series 8: DWI · coronal · 5.0mm · 1.09mm/px · 5 of 68 slices shown (2 of 4)]
[im 1/68]
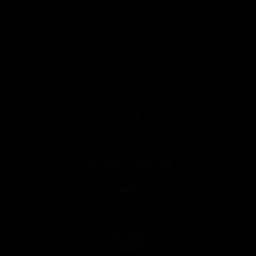
[im 17/68]
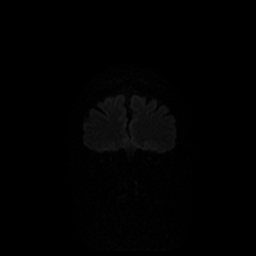
[im 34/68]
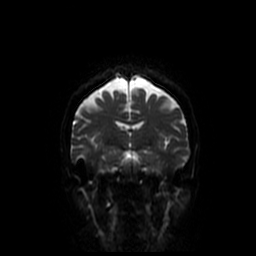
[im 51/68]
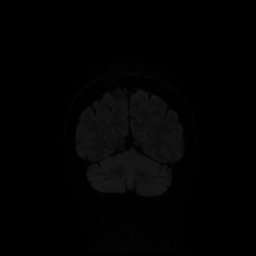
[im 68/68]
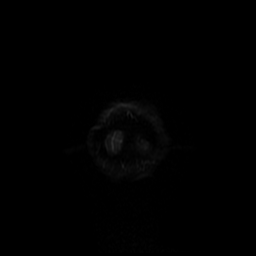

[Series 11: T2 post-contrast · coronal · 5.0mm · 0.39mm/px · 2 of 26 slices shown]
[im 1/26]
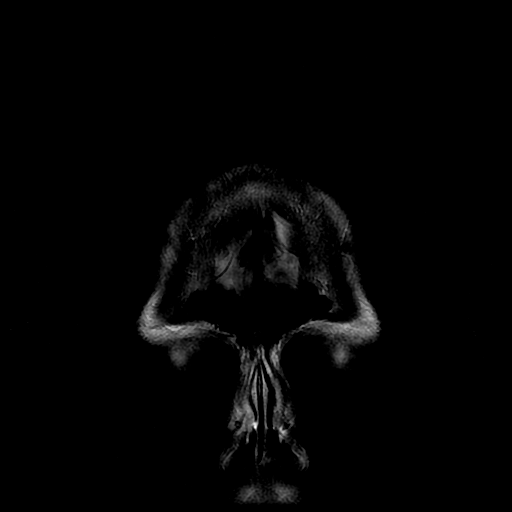
[im 26/26]
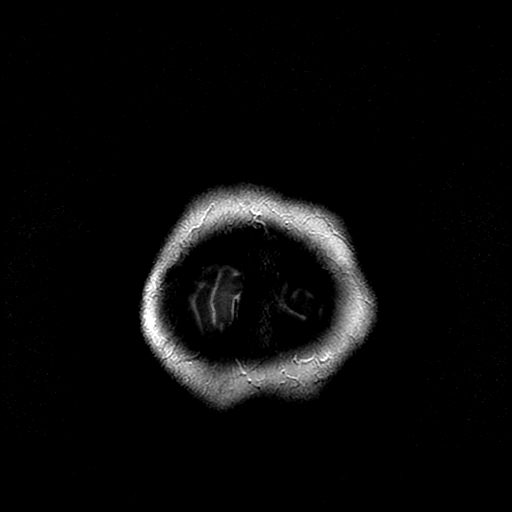

[Series 400: DWI · axial · 3.0mm · 1.09mm/px · z∈[-88,+50]mm · 4 of 47 slices shown (3 of 4)]
[im 1/47]
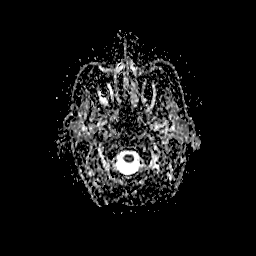
[im 16/47]
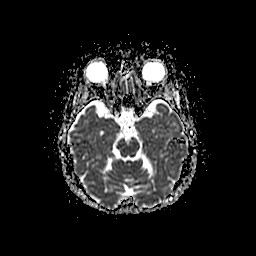
[im 31/47]
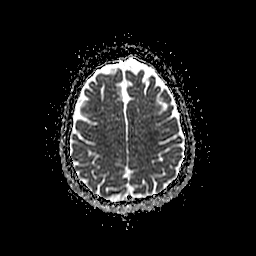
[im 47/47]
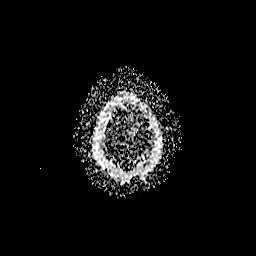

[Series 800: DWI · coronal · 5.0mm · 1.09mm/px · 3 of 34 slices shown (4 of 4)]
[im 1/34]
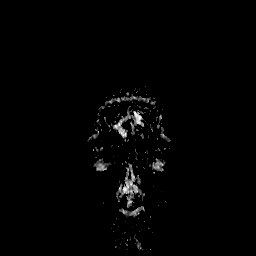
[im 17/34]
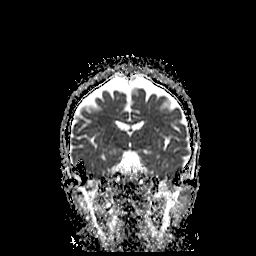
[im 34/34]
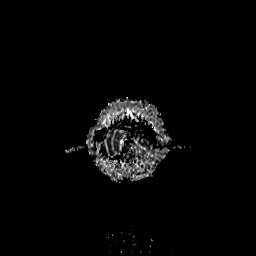

[32 of 48 positions shown; findings below may reference images not displayed]

FINDINGS: MRI HEAD FINDINGS

Age appropriate cerebral atrophy present. Few scattered T2/FLAIR
hyperintense foci noted within the periventricular, deep, and
subcortical white matter of both cerebral hemispheres, nonspecific,
but most likely related to mild chronic small vessel ischemic
disease. These foci are stable relative to previous exams.

No abnormal foci of restricted diffusion to suggest acute
intracranial infarct. Gray-white matter differentiation maintained.
Major intracranial vascular flow voids are preserved. No areas of
chronic infarction. No acute or chronic intracranial hemorrhage.

No mass lesion, midline shift, or mass effect. No hydrocephalus. No
extra-axial fluid collection. Major dural sinuses are grossly
patent.

Craniocervical junction normal. Visualized upper cervical spine
unremarkable.

Pituitary gland within normal limits. No acute abnormality about the
orbits.

Mild scattered mucosal thickening within the paranasal sinuses. No
air-fluid levels to suggest active sinus infection. No mastoid
effusion. Inner ear structures grossly normal.

Bone marrow signal intensity within normal limits. No scalp soft
tissue abnormality.

MRA HEAD FINDINGS

ANTERIOR CIRCULATION:

Visualized distal cervical segments of the internal carotid arteries
are widely patent with antegrade flow. Petrous, cavernous, and
supraclinoid segments widely patent bilaterally without
flow-limiting stenosis. A1 segments patent. Anterior communicating
artery normal. Anterior cerebral arteries well opacified. M1
segments patent without stenosis or occlusion. MCA bifurcations
normal. MCA branches well opacified and fairly symmetric.

POSTERIOR CIRCULATION:

Vertebral arteries largely code dominant and patent to the
vertebrobasilar junction. Posterior inferior cerebral arteries
patent bilaterally. Basilar artery widely patent to its distal
aspect. Basilar tip is ectatic without frank aneurysm. Superior
cerebral arteries patent bilaterally. Both of the posterior cerebral
arteries arise from the basilar arteries and are well opacified to
their distal aspects. Small right posterior communicating artery
noted.

No aneurysm or vascular malformation.
IMPRESSION: MRI HEAD IMPRESSION:

1. No acute intracranial infarct or other process identified.
2. Stable mild nonspecific white matter disease.

MRA HEAD IMPRESSION:

Negative intracranial MRA. No large or proximal arterial branch
occlusion. No high-grade or correctable stenosis.

## 2018-01-15 MED ORDER — ACETAMINOPHEN 325 MG PO TABS
650.0000 mg | ORAL_TABLET | Freq: Once | ORAL | Status: AC
  Administered 2018-01-15: 650 mg via ORAL
  Filled 2018-01-15: qty 2

## 2018-01-15 MED ORDER — METHOCARBAMOL 500 MG PO TABS: 500.0000 mg | ORAL_TABLET | Freq: Two times a day (BID) | ORAL | 0 refills | Status: DC

## 2018-01-15 NOTE — ED Provider Notes (Signed)
[facility] DEPT Provider Note   CSN: [id number] Arrival date & time: 01/15/18  1849     History   Chief Complaint Chief Complaint  Patient presents with  . Extremity Weakness    bilateral legs    HPI Dorothy White is a 67 y.o. female.  67 year old female with history of chronic pain presents with worsening left-sided pain similar to her prior chronic pain.  Was seen here earlier today that work-up was reviewed.  Patient states that she has had falls but has not had any head injury.  Denies any neck discomfort.  No recent illnesses.  Does have a walker which she does not use and uses a cane on a regular basis.  She left here and went to urgent care center and was sent here for further management.  Patient's request is that she be given a prescription for Percocet.  Review of the [place] database shows that patient had a prescription for Percocet filled for 120 tablets on June 6.  Patient stated that she also goes to pain management and is under contract.  Patient states that she ran out of her Percocet because she threw them away.  She is requesting 5 days worth at this time.     Past Medical History:  Diagnosis Date  . Anemia    ?  Marland Kitchen Anxiety   . Arthritis    "everywhere" (04/25/2016)  . 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 Chronic back pain    "all over" (04/25/2016)  . Constipation   . Depression   . Diverticulitis   . GERD (gastroesophageal reflux disease)   . Headache    "weekly" (04/25/2016)  . 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 HLD (hyperlipidemia)    hx (04/25/2016)  . 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 Mild CAD    a. 25% mLAD, otherwise no sig disease 01/2016 cath.  Marland Kitchen NICM (nonischemic cardiomyopathy) ([place])    a. EF 40-45% by echo 01/6282 at time of complicated migraine/neuro sx, 55-65% at time of cath 01/2016  . Stroke [facility]) 06/2013   "mini" stroke , ?possibly  hemaplegic migraine per pt    Patient Active Problem List   Diagnosis Date Noted  . Recurrent falls 04/16/2017  . 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 At risk for adverse drug event 04/07/2017  . Acute blood loss as cause of postoperative anemia 04/04/2017  . Acute delirium 04/04/2017  . Osteoporosis 04/02/2017  . Closed fracture of neck of left femur ([place]) 03/31/2017  . Scalp contusion 03/31/2017  . Fall at home, initial encounter 03/31/2017  . Hip fracture ([place]) 03/31/2017  . 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 Hx of transient ischemic attack (TIA) 02/19/2017  . Left carotid stenosis- 40-59% 02/19/2017  . Malnutrition of moderate degree 02/18/2017  . Falls 02/17/2017  . Chest pain 09/26/2016  . Diverticulitis of sigmoid colon 04/25/2016  . Infection due to ESBL-producing Escherichia coli/Diverticular Abscess 03/15/2016  . Diverticulitis 03/12/2016  . Chronic pain syndrome 03/12/2016  . Lesion of pancreas 03/12/2016  . History of chest pain 03/12/2016  . Hypokalemia 03/12/2016  . Angina, class IV ([place]) - chest tightness and pressure with dyspnea with minimal exertion 02/17/2016    Class: Question of  . TIA (transient ischemic attack) 12/26/2015  . DOE (dyspnea on exertion) 12/26/2015  . Right sided weakness 12/17/2015  . Ataxia 12/17/2015  . Shoulder fracture 02/15/2015  .  Chronic headache 10/11/2013  . Weakness generalized 08/12/2013  . Dizziness and giddiness 08/12/2013  . Abnormality of gait 07/11/2013  . Hyperlipidemia 05/07/2007  . Migraine without aura 05/07/2007  . PREMATURE VENTRICULAR CONTRACTIONS, FREQUENT 05/07/2007  . GERD 05/07/2007  . DIVERTICULOSIS, COLON 05/07/2007  . 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 OSTEOPENIA 05/07/2007  . Anxiety and depression 03/24/2007  . HEMORRHOIDS 03/24/2007  . ALLERGIC RHINITIS 03/24/2007  . LOW BACK PAIN 03/24/2007  . MIGRAINES, HX OF 03/24/2007    Past Surgical History:  Procedure Laterality  Date  . ANTERIOR APPROACH HEMI HIP ARTHROPLASTY Left 04/01/2017   Procedure: LEFT DIRECT ANTERIOR TOTAL HIP REPLACEMENT;  Surgeon: Leandrew Koyanagi, MD;  Location: [facility];  Service: Orthopedics;  Laterality: Left;  LEFT DIRECT ANTERIOR TOTAL HIP REPLACEMENT  . AUGMENTATION MAMMAPLASTY  1980  . BASAL CELL CARCINOMA EXCISION     "tip of my nose"  . BUNIONECTOMY Bilateral 10/2003  . CARDIAC CATHETERIZATION N/A 02/20/2016   Procedure: Left Heart Cath and Coronary Angiography;  Surgeon: Jettie Booze, MD;  Location: [facility];  Service: Cardiovascular;  Laterality: N/A;  . COLONOSCOPY    . DILATION AND CURETTAGE OF UTERUS    . FRACTURE SURGERY    . IR GENERIC HISTORICAL  03/18/2016   IR SINUS/FIST TUBE CHK-NON GI 03/18/2016 Aletta Edouard, MD [facility]  . LAPAROSCOPIC SIGMOID COLECTOMY N/A 04/25/2016   Procedure: LAPAROSCOPIC SIGMOID COLECTOMY;  Surgeon: Stark Klein, MD;  Location: [facility];  Service: General;  Laterality: N/A;  . OOPHORECTOMY Bilateral ~ 1999  . ORIF HUMERUS FRACTURE Right 02/15/2015   Procedure: OPEN REDUCTION INTERNAL FIXATION (ORIF) PROXIMAL HUMERUS FRACTURE;  Surgeon: Netta Cedars, MD;  Location: [facility];  Service: Orthopedics;  Laterality: Right;  . RHINOPLASTY  1976  . TONSILLECTOMY    . TUBAL LIGATION  ~ 1983  . VAGINAL HYSTERECTOMY  ~ 1998     OB History   None      Home Medications    Prior to Admission medications   Medication Sig Start Date End Date Taking? Authorizing Provider  clonazePAM (KLONOPIN) 1 MG tablet Take 1 mg by mouth 2 (two) times daily as needed for anxiety.    Yes [provider]  FLUoxetine (PROZAC) 20 MG capsule Take 60 mg by mouth daily.  01/15/18  Yes [provider]  LUTEIN PO Take 1 capsule by mouth daily.    Yes [provider]  Magnesium 500 MG TABS Take 500 mg by mouth at bedtime.   Yes [provider]  omeprazole (PRILOSEC) 40 MG capsule Take 40 mg by mouth daily. 06/22/17  Yes [provider]  oxyCODONE-acetaminophen (PERCOCET) 10-325 MG tablet Take 1 tablet by mouth every 6 (six) hours as needed for pain.  12/31/17  Yes [provider]  SUMAtriptan (IMITREX) 100 MG tablet TAKE 1/2 TO 1 TABLET BY MOUTH TWICE DAILY AS NEEDED FOR MIGRAINE 12/29/17  Yes [provider]  tiZANidine (ZANAFLEX) 4 MG tablet Take 4 mg by mouth 3 (three) times daily as needed for muscle spasms.  12/28/17  Yes [provider]  topiramate (TOPAMAX) 100 MG tablet Take 1 tablet (100 mg total) by mouth at bedtime. 12/28/17  Yes Kathrynn Ducking, MD  traZODone (DESYREL) 100 MG tablet Take 200 mg by mouth at bedtime. 12/29/17  Yes [provider]  vitamin B-12 (CYANOCOBALAMIN) 1000 MCG tablet Take 1 tablet (1,000 mcg total) by mouth daily. 02/19/17  Yes Debbe Odea, MD  FLUoxetine (  PROZAC) 20 MG tablet Take 3 tablets (60 mg total) by mouth every morning. Take 3 tablets to = 60 mg qam Patient not taking: Reported on 01/15/2018 04/22/17   Medina-Vargas, Monina C, NP  ibuprofen (ADVIL,MOTRIN) 600 MG tablet Take 1 tablet (600 mg total) by mouth every 6 (six) hours as needed. Patient not taking: Reported on 01/15/2018 05/10/17   Shary Decamp, PA-C  mometasone (NASONEX) 50 MCG/ACT nasal spray Place 2 sprays into the nose daily. Patient not taking: Reported on 01/15/2018 04/22/17   Medina-Vargas, Monina C, NP  ondansetron (ZOFRAN) 8 MG tablet Take 1 tablet (8 mg total) by mouth every 8 (eight) hours as needed for nausea or vomiting. Patient not taking: Reported on 01/15/2018 04/22/17   Medina-Vargas, Monina C, NP  trazodone (DESYREL) 300 MG tablet Take 1 tablet (300 mg total) by mouth at bedtime as needed for sleep. Patient not taking: Reported on 01/15/2018 04/22/17   Medina-Vargas, Senaida Lange, NP    Family History Family History  Problem Relation Age of Onset  . Lung cancer Mother 70  . Migraines Mother   . Heart attack Father        Vague  . Hypertension Brother   . Esophageal cancer  Brother   . Diabetes Paternal Grandmother        questionable  . Colon cancer Neg Hx   . Kidney disease Neg Hx   . Liver disease Neg Hx     Social History Social History   Tobacco Use  . Smoking status: Current Some Day Smoker    Packs/day: 1.00    Years: 34.00    Pack years: 34.00    Types: Cigarettes    Last attempt to quit: 02/03/2017    Years since quitting: 0.9  . Smokeless tobacco: Never Used  . Tobacco comment: 10 per week or less  Substance Use Topics  . Alcohol use: Yes    Alcohol/week: 0.6 oz    Types: 1 Standard drinks or equivalent per week    Comment: occ   . Drug use: No    Types: Cocaine    Comment: Denies 11-17-17     Allergies   Opana [oxymorphone hcl] and Penicillins   Review of Systems Review of Systems  All other systems reviewed and are negative.    Physical Exam Updated Vital Signs BP 120/65   Pulse 61   Temp 98.1 F (36.7 C) (Oral)   Resp 16   SpO2 98%   Physical Exam  Constitutional: She is oriented to person, place, and time. She appears well-developed and well-nourished.  Non-toxic appearance. No distress.  HENT:  Head: Normocephalic and atraumatic.  Eyes: Pupils are equal, round, and reactive to light. Conjunctivae, EOM and lids are normal.  Neck: Normal range of motion. Neck supple. No tracheal deviation present. No thyroid mass present.  Cardiovascular: Normal rate, regular rhythm and normal heart sounds. Exam reveals no gallop.  No murmur heard. Pulmonary/Chest: Effort normal and breath sounds normal. No stridor. No respiratory distress. She has no decreased breath sounds. She has no wheezes. She has no rhonchi. She has no rales.  Abdominal: Soft. Normal appearance and bowel sounds are normal. She exhibits no distension. There is no tenderness. There is no rebound and no CVA tenderness.  Musculoskeletal: Normal range of motion. She exhibits no edema or tenderness.  Patient has full range of motion in all of her joints.  She has  no shortening or rotation.  No gross deformities.  Neurological: She is  alert and oriented to person, place, and time. She has normal strength. She displays no atrophy and no tremor. No cranial nerve deficit or sensory deficit. GCS eye subscore is 4. GCS verbal subscore is 5. GCS motor subscore is 6.  Skin: Skin is warm and dry. No abrasion and no rash noted.  Psychiatric: She has a normal mood and affect. Her speech is normal and behavior is normal.  Nursing note and vitals reviewed.    ED Treatments / Results  Labs (all labs ordered are listed, but only abnormal results are displayed) Labs Reviewed - No data to display  EKG None  Radiology No results found.  Procedures Procedures (including critical care time)  Medications Ordered in ED Medications - No data to display   Initial Impression / Assessment and Plan / ED Course  I have reviewed the triage vital signs and the nursing notes.  Pertinent labs & imaging results that were available during my care of the patient were reviewed by me and considered in my medical decision making (see chart for details).    Patient has no visible signs of trauma at this time.  She has no evidence of extremity impairment.  Her neurological exam shows good strength in all of her extremities.  I explained to the patient that I will not be writing her for opiates.  She became upset at that time.  A second conversation was done and from the charge nurse and I informed her that I can give her Robaxin but that she needs to follow with her pain management specialist.  Final Clinical Impressions(s) / ED Diagnoses   Final diagnoses:  None    ED Discharge Orders    None       Lacretia Leigh, MD 01/15/18 1958

## 2018-01-15 NOTE — ED Notes (Signed)
Bed: WT88 Expected date:  Expected time:  Means of arrival:  Comments: 67 yo bilateral leg weakness

## 2018-01-15 NOTE — ED Triage Notes (Signed)
Pt reports she has had multiple falls over the past couple weeks. Reports she has a cane but "dont think in need to use it".   Had left hip surgery 6 months ago and still has pain from it. Sees pain clinic for her chronic pain issues.

## 2018-01-15 NOTE — ED Triage Notes (Signed)
Per EMS, pt sent from urgent care complaining bilateral leg pain and repeated falls. Pt was seen previously today at [facility].   BP 134/62 HR 66 RR 16 SpO2 97%

## 2018-01-15 NOTE — ED Notes (Signed)
Pt offered a wheelchair for comfort multiple times. She declined and ambulated out of the ED with this RN. She ambulated under her own power with stand by assistance only. Pt walked to a chair in lobby and was visualized sitting down.

## 2018-01-15 NOTE — ED Notes (Signed)
Patient very frustrated that she is being discharged without pain medication. This RN got Dr. Wilson Singer to come into patient room and explain the patient's findings and the reasoning behind her discharge. Patient remains frustrated with the physician and states "this is bullshit! The hospital is supposed to care about people, but they just send them home, even if theyre hurting. That doctor doesn't give a flip about me!"  This nurse gave the patient the Patient Advocate number with her discharge paperwork. Discharge instructions reviewed with patient. Patient verbalizes understanding. VSS

## 2018-01-15 NOTE — ED Notes (Signed)
Discharge instructions reviewed with pt. Pt refusing to sign for discharge. PT offered a wheelchair to the waiting room. Pt refused. Pt reports that she will walk to the waiting room herself. Charge nurse to walk out to waiting room with pt.

## 2018-01-15 NOTE — Care Management Note (Signed)
Case Management Note  CM consulted for DME walker.  CM spoke with pt who reports she has 2 walkers at home already and does not need another.  Updated Dr. Wilson Singer.  He advised pt is currently receiving outpt PT services and has no issues with follow up appointments.  No further CM needs noted at this time.  Eriel Doyon, Benjaman Lobe, RN 01/15/2018, 11:57 AM

## 2018-01-15 NOTE — ED Notes (Signed)
This RN witnessed EDP speak with patient about opioid medications and the reasons that he cannot provide them for her today. Pt verbalized understanding. Other methods of pain interventions discussed.

## 2018-01-15 NOTE — ED Provider Notes (Signed)
[facility] DEPT Provider Note   CSN: [id number] Arrival date & time: 01/15/18  1100     History   Chief Complaint Chief Complaint  Patient presents with  . Fall    HPI Dorothy White is a 67 y.o. female.  HPI   66yF with frequent falls. Ongoing issue. Reports chronic pain in L hip since surgery over 5 months ago. Has fallen innumerable times in the past several months. Denies any acute injury from recent falls. Says she has problems with her gait. Does not always use assistive devices like she knows she should. Requesting pain medication. Says she takes tylenol but doesn't help much.   Past Medical History:  Diagnosis Date  . Anemia    ?  Marland Kitchen Anxiety   . Arthritis    "everywhere" (04/25/2016)  . 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 Chronic back pain    "all over" (04/25/2016)  . Constipation   . Depression   . Diverticulitis   . GERD (gastroesophageal reflux disease)   . Headache    "weekly" (04/25/2016)  . 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 HLD (hyperlipidemia)    hx (04/25/2016)  . 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 Mild CAD    a. 25% mLAD, otherwise no sig disease 01/2016 cath.  Marland Kitchen NICM (nonischemic cardiomyopathy) ([place])    a. EF 40-45% by echo 10/979 at time of complicated migraine/neuro sx, 55-65% at time of cath 01/2016  . Stroke [facility]) 06/2013   "mini" stroke , ?possibly hemaplegic migraine per pt    Patient Active Problem List   Diagnosis Date Noted  . Recurrent falls 04/16/2017  . 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 At risk for adverse drug event 04/07/2017  . Acute blood loss as cause of postoperative anemia 04/04/2017  . Acute delirium 04/04/2017  . Osteoporosis 04/02/2017  . Closed fracture of neck of left femur ([place]) 03/31/2017  . Scalp contusion 03/31/2017  . Fall at home, initial encounter 03/31/2017  . Hip fracture ([place]) 03/31/2017   . 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 Hx of transient ischemic attack (TIA) 02/19/2017  . Left carotid stenosis- 40-59% 02/19/2017  . Malnutrition of moderate degree 02/18/2017  . Falls 02/17/2017  . Chest pain 09/26/2016  . Diverticulitis of sigmoid colon 04/25/2016  . Infection due to ESBL-producing Escherichia coli/Diverticular Abscess 03/15/2016  . Diverticulitis 03/12/2016  . Chronic pain syndrome 03/12/2016  . Lesion of pancreas 03/12/2016  . History of chest pain 03/12/2016  . Hypokalemia 03/12/2016  . Angina, class IV ([place]) - chest tightness and pressure with dyspnea with minimal exertion 02/17/2016    Class: Question of  . TIA (transient ischemic attack) 12/26/2015  . DOE (dyspnea on exertion) 12/26/2015  . Right sided weakness 12/17/2015  . Ataxia 12/17/2015  . Shoulder fracture 02/15/2015  . Chronic headache 10/11/2013  . Weakness generalized 08/12/2013  . Dizziness and giddiness 08/12/2013  . Abnormality of gait 07/11/2013  . Hyperlipidemia 05/07/2007  . Migraine without aura 05/07/2007  . PREMATURE VENTRICULAR CONTRACTIONS, FREQUENT 05/07/2007  . GERD 05/07/2007  . DIVERTICULOSIS, COLON 05/07/2007  . 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 OSTEOPENIA 05/07/2007  . Anxiety and depression 03/24/2007  . HEMORRHOIDS 03/24/2007  . ALLERGIC RHINITIS 03/24/2007  . LOW BACK PAIN 03/24/2007  . MIGRAINES, HX OF 03/24/2007    Past Surgical History:  Procedure Laterality Date  . ANTERIOR APPROACH HEMI HIP ARTHROPLASTY Left  04/01/2017   Procedure: LEFT DIRECT ANTERIOR TOTAL HIP REPLACEMENT;  Surgeon: Leandrew Koyanagi, MD;  Location: Uncertain;  Service: Orthopedics;  Laterality: Left;  LEFT DIRECT ANTERIOR TOTAL HIP REPLACEMENT  . AUGMENTATION MAMMAPLASTY  1980  . BASAL CELL CARCINOMA EXCISION     "tip of my nose"  . BUNIONECTOMY Bilateral 10/2003  . CARDIAC CATHETERIZATION N/A 02/20/2016   Procedure: Left Heart Cath and Coronary Angiography;  Surgeon: Jettie Booze, MD;   Location: [facility];  Service: Cardiovascular;  Laterality: N/A;  . COLONOSCOPY    . DILATION AND CURETTAGE OF UTERUS    . FRACTURE SURGERY    . IR GENERIC HISTORICAL  03/18/2016   IR SINUS/FIST TUBE CHK-NON GI 03/18/2016 Aletta Edouard, MD [facility]-INTERV RAD  . LAPAROSCOPIC SIGMOID COLECTOMY N/A 04/25/2016   Procedure: LAPAROSCOPIC SIGMOID COLECTOMY;  Surgeon: Stark Klein, MD;  Location: [facility];  Service: General;  Laterality: N/A;  . OOPHORECTOMY Bilateral ~ 1999  . ORIF HUMERUS FRACTURE Right 02/15/2015   Procedure: OPEN REDUCTION INTERNAL FIXATION (ORIF) PROXIMAL HUMERUS FRACTURE;  Surgeon: Netta Cedars, MD;  Location: [place];  Service: Orthopedics;  Laterality: Right;  . RHINOPLASTY  1976  . TONSILLECTOMY    . TUBAL LIGATION  ~ 1983  . VAGINAL HYSTERECTOMY  ~ 1998     OB History   None      Home Medications    Prior to Admission medications   Medication Sig Start Date End Date Taking? Authorizing Provider  acetaminophen (TYLENOL) 325 MG tablet Take 975 mg by mouth every 6 (six) hours as needed for headache (pain).     [provider]  aspirin EC 81 MG tablet Take 81 mg by mouth every other day.    [provider]  CALCIUM PO Take 1 tablet by mouth daily with lunch.    [provider]  cholecalciferol (VITAMIN D) 1000 units tablet Take 1,000 Units by mouth daily.    [provider]  clonazePAM (KLONOPIN) 1 MG tablet Take 1 mg by mouth 2 (two) times daily as needed for anxiety.     [provider]  FLUoxetine (PROZAC) 20 MG capsule  01/15/18   [provider]  FLUoxetine (PROZAC) 20 MG tablet Take 3 tablets (60 mg total) by mouth every morning. Take 3 tablets to = 60 mg qam Patient taking differently: Take 40 mg by mouth daily. 2 tablets 04/22/17   Medina-Vargas, Monina C, NP  FLUoxetine (PROZAC) 40 MG capsule Take 40 mg by mouth daily.  01/10/18   [provider]  ibuprofen (ADVIL,MOTRIN) 600 MG tablet Take 1 tablet  (600 mg total) by mouth every 6 (six) hours as needed. 05/10/17   Shary Decamp, PA-C  LUTEIN PO Take 1 capsule by mouth daily.     [provider]  Magnesium 500 MG TABS Take 500 mg by mouth at bedtime.    [provider]  mometasone (NASONEX) 50 MCG/ACT nasal spray Place 2 sprays into the nose daily. 04/22/17   Medina-Vargas, Monina C, NP  omeprazole (PRILOSEC) 40 MG capsule Take 40 mg by mouth daily. 06/22/17   [provider]  ondansetron (ZOFRAN) 8 MG tablet Take 1 tablet (8 mg total) by mouth every 8 (eight) hours as needed for nausea or vomiting. 04/22/17   Medina-Vargas, Monina C, NP  oxyCODONE-acetaminophen (PERCOCET) 10-325 MG tablet Take 1 tablet by mouth every 6 (six) hours as needed. 12/31/17   [provider]  promethazine (PHENERGAN) 25 MG tablet  Take 25 mg by mouth every 12 (twelve) hours as needed for nausea or vomiting.    [provider]  SUMAtriptan (IMITREX) 100 MG tablet TAKE 1/2 TO 1 TABLET BY MOUTH TWICE DAILY AS NEEDED FOR MIGRAINE 12/29/17   [provider]  SUMATRIPTAN SUCCINATE PO Take 1 Dose by mouth as needed.    [provider]  tiZANidine (ZANAFLEX) 4 MG tablet Take 4 mg by mouth 3 (three) times daily as needed. 12/28/17   [provider]  topiramate (TOPAMAX) 100 MG tablet Take 1 tablet (100 mg total) by mouth at bedtime. 12/28/17   Kathrynn Ducking, MD  traZODone (DESYREL) 100 MG tablet Take 200 mg by mouth at bedtime. 12/29/17   [provider]  trazodone (DESYREL) 300 MG tablet Take 1 tablet (300 mg total) by mouth at bedtime as needed for sleep. 04/22/17   Medina-Vargas, Monina C, NP  vitamin B-12 (CYANOCOBALAMIN) 1000 MCG tablet Take 1 tablet (1,000 mcg total) by mouth daily. 02/19/17   Debbe Odea, MD  vitamin E 100 UNIT capsule Take 100 Units by mouth daily.    [provider]  zolpidem (AMBIEN) 10 MG tablet TAKE 1 TABLET BY MOUTH EVERY DAY AT BEDTIME AS NEEDED 12/26/17   [provider]    Family History Family History  Problem Relation Age of Onset  . Lung cancer Mother 55  . Migraines Mother   . Heart attack Father        Vague  . Hypertension Brother   . Esophageal cancer Brother   . Diabetes Paternal Grandmother        questionable  . Colon cancer Neg Hx   . Kidney disease Neg Hx   . Liver disease Neg Hx     Social History Social History   Tobacco Use  . Smoking status: Current Some Day Smoker    Packs/day: 1.00    Years: 34.00    Pack years: 34.00    Types: Cigarettes    Last attempt to quit: 02/03/2017    Years since quitting: 0.9  . Smokeless tobacco: Never Used  . Tobacco comment: 10 per week or less  Substance Use Topics  . Alcohol use: Yes    Alcohol/week: 0.6 oz    Types: 1 Standard drinks or equivalent per week    Comment: occ   . Drug use: No    Types: Cocaine    Comment: Denies 11-17-17     Allergies   Opana [oxymorphone hcl] and Penicillins   Review of Systems Review of Systems All systems reviewed and negative, other than as noted in HPI.   Physical Exam Updated Vital Signs BP (!) 144/84 (BP Location: Right Arm)   Pulse 70   Temp 98.1 F (36.7 C) (Oral)   Resp 17   SpO2 97%   Physical Exam  Constitutional: She appears well-developed and well-nourished. No distress.  HENT:  Head: Normocephalic and atraumatic.  Eyes: Conjunctivae are normal. Right eye exhibits no discharge. Left eye exhibits no discharge.  Neck: Neck supple.  Cardiovascular: Normal rate, regular rhythm and normal heart sounds. Exam reveals no gallop and no friction rub.  No murmur heard. Pulmonary/Chest: Effort normal and breath sounds normal. No respiratory distress.  Abdominal: Soft. She exhibits no distension. There is no tenderness.  Musculoskeletal: She exhibits no edema or tenderness.  Neurological: She is alert.  Able to stand up and walk unassisted although with a somewhat shuffling gait.   Skin: Skin is warm and  dry.    Psychiatric: She has a normal mood and affect. Her behavior is normal. Thought content normal.  Nursing note and vitals reviewed.    ED Treatments / Results  Labs (all labs ordered are listed, but only abnormal results are displayed) Labs Reviewed  URINALYSIS, ROUTINE W REFLEX MICROSCOPIC - Abnormal; Notable for the following components:      Result Value   APPearance HAZY (*)    All other components within normal limits    EKG None  Radiology No results found.   Mr Brain Wo Contrast  Addendum Date: 12/31/2017   On comparison with prior MRI dated 02/17/2017 there appears to be the minimal progression of atrophy and white matter changes  Result Date: 12/31/2017  [facility] [address], [place], [place] [address] [phone] NEUROIMAGING REPORT STUDY DATE: 12/30/2017 PATIENT NAME: Dorothy White DOB: 06-Sep-1950 MRN: [id number] ORDERING CLINICIAN: Dr Jannifer Franklin CLINICAL HISTORY:  29 year patient with gait abnormality and falls COMPARISON FILMS:CT Head 03/31/2017 EXAM: MRI Brain wo TECHNIQUE: MRI of the brain without contrast was obtained utilizing 5 mm axial slices with T1, T2, T2 flair, T2 star gradient echo and diffusion weighted views.  T1 sagittal and T2 coronal views were obtained. CONTRAST: none IMAGING SITE: [facility] FINDINGS: The brain parenchyma shows mild changes of chronic microvascular ischemia and mild supratentorial cerebral atrophy which appears out of proportion to the patient's age. No other structural lesion, tumor infarcts are noted. There are mild changes of chronic paranasal sinusitis.No abnormal lesions are seen on diffusion-weighted views to suggest acute ischemia. The cortical sulci, fissures and cisterns are normal in size and appearance. Lateral, third and fourth ventricle are normal in size and appearance. No extra-axial fluid collections are seen. No evidence of mass effect or midline shift.  On sagittal views the posterior fossa,  pituitary gland and corpus callosum are unremarkable. No evidence of intracranial hemorrhage on gradient-echo views. The orbits and their contents, paranasal sinuses and calvarium are unremarkable.  Intracranial flow voids are present.    Slightly abnormal MRI scan of the brain showing mild age disproportionate supratentorial cortical atrophy and age-appropriate changes of chronic microvascular ischemia. No acute abnormality is noted INTERPRETING PHYSICIAN: Antony Contras, MD Certified in  Neuroimaging by [place] [employer] and [place] [employer] for Neurological Subspecialities   Mr Lumbar Spine Wo Contrast  Result Date: 12/31/2017  [facility] [address] [place], [place] [address] [phone] NEUROIMAGING REPORT STUDY DATE: 12/30/2017 PATIENT NAME: Dorothy White DOB: February 05, 1951 MRN: [id number] ORDERING CLINICIAN:Dr Jannifer Franklin CLINICAL HISTORY:  66 year patient with gait abnormality COMPARISON FILMS: none EXAM: MRI Lumbar Spine wo TECHNIQUE: MRI of the lumbar spine was obtained utilizing 4 mm sagittal slices from E26-83 down to the lower sacrum with T1, T2 and inversion recovery views. In addition 4 mm axial slices from M1-9 down to L5-S1 level were included with T1 and T2 weighted views. CONTRAST: none IMAGING SITE: [facility] FINDINGS: The lumbar vertebrae demonstrate normal alignment, body height and bone marrow signal characteristics. T11/12 shows mild disc degenerative change with hypertrophy of ligamentum flavum and facet on the left without significant narrowing. L1-L2 shows asymmetric left greater than right facet hypertrophy and ligamentum flavum hypertrophy resulting in mild posterior canal narrowing but without significant compression. L2-3 shows asymmetric this osteophyte protrusion to the left resulting in narrowing of the left lateral recess and asymmetric facet hypertrophy left greater than right resulting in mild left-sided foraminal narrowing and  mild  posterior canal narrowing but no significant compression. L3-4 shows prominent asymmetric facet hypertrophy on the left resulting in mild foraminal narrowing and a small perineural cyst is noted in the left foramina but without significant compression. L4-5 shows prominent facet hypertrophy and ligamentum flavum hypertrophy resulting in mild bilateral foraminal and posterior canal narrowing but without significant compression. L5-S1 not sure shows prominent facet hypertrophy bilaterally but without significant compression. Conus medullaris terminates at L1. Visualized Lower Thoracic Spine Also Show Mild Degenerative Changes. Visualized Paraspinal Soft Tissues Show No Abnormalities.    Abnormal MRI scan of the lumbar spine showing prominent spondylitic and facet degenerative changes throughout most prominent at L2-3 with a left-sided foraminal narrowing but without significant compression. INTERPRETING PHYSICIAN: Antony Contras, MD Certified in  Neuroimaging by [place] [employer] and [employer] for Neurological Subspecialities    Procedures Procedures (including critical care time)  Medications Ordered in ED Medications  acetaminophen (TYLENOL) tablet 650 mg (has no administration in time range)     Initial Impression / Assessment and Plan / ED Course  I have reviewed the triage vital signs and the nursing notes.  Pertinent labs & imaging results that were available during my care of the patient were reviewed by me and considered in my medical decision making (see chart for details).     67 year old female with frequent falls.  Ongoing issue.  She has been evaluated by neurology for the same complaint.  Recent MRI of the brain and lumbar spine without revealing pathology.  Per review of records, she has been set up with physical therapy.  She has both a cane and 2 walkers at home.  She was encouraged to use these on a regular basis.  She denies any acute injury from her more  recent falls. She is upset about not being given vicodin for her pain. Explained that prescriptions of opiate pain medication from the ED for chronic pain is inappropriate and to follow-up with pain management or PCP.    Final Clinical Impressions(s) / ED Diagnoses   Final diagnoses:  Frequent falls    ED Discharge Orders    None       Virgel Manifold, MD 01/20/18 1240

## 2018-01-16 IMAGING — MR MR CERVICAL SPINE W/O CM
4 of 5 series · 18 of 48 positions shown · non-contrast
Comparison: MRI brain and MRA head 12/18/2015

CLINICAL DATA: Unsteady gait and falling to the right.

EXAM:
MRI CERVICAL SPINE WITHOUT CONTRAST
TECHNIQUE: Multiplanar, multisequence MR imaging of the cervical spine was
performed. No intravenous contrast was administered.

[Series 2: T2 · sagittal · 3.0mm · 0.43mm/px · 5 of 12 slices shown (1 of 2)]
[im 1/12]
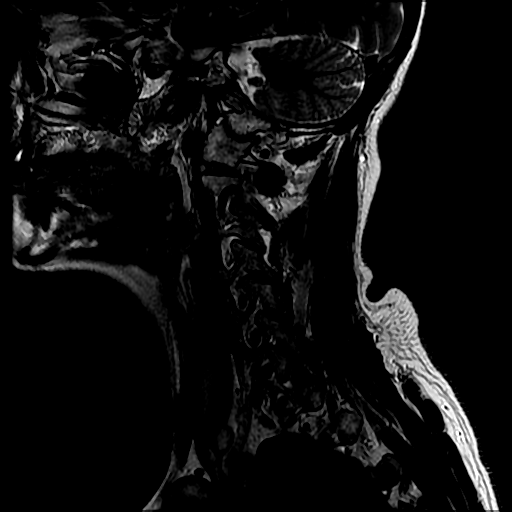
[im 3/12]
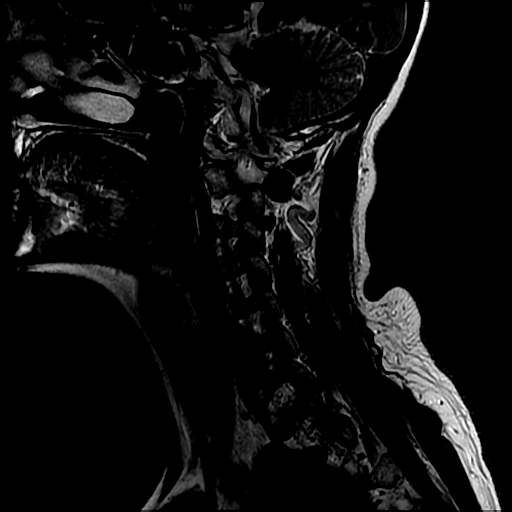
[im 6/12]
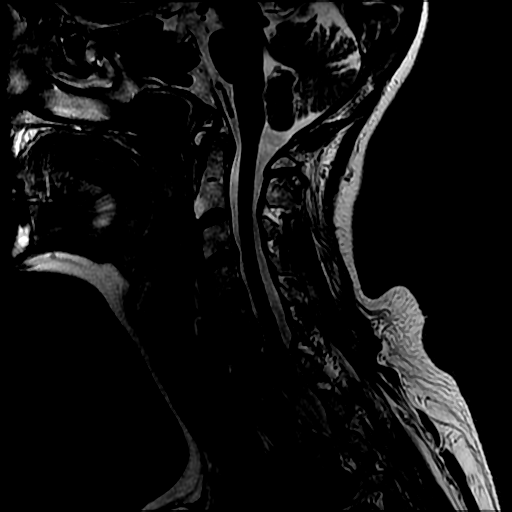
[im 9/12]
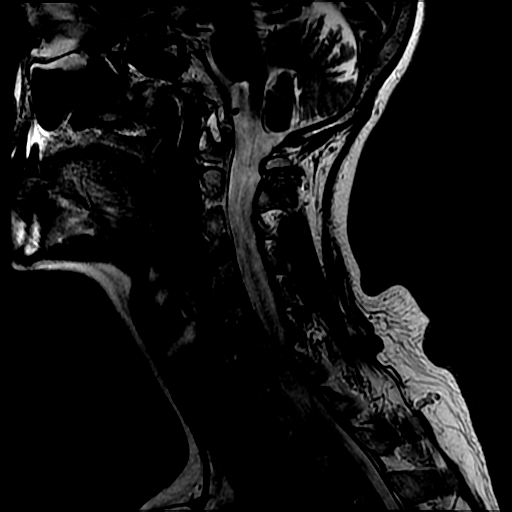
[im 12/12]
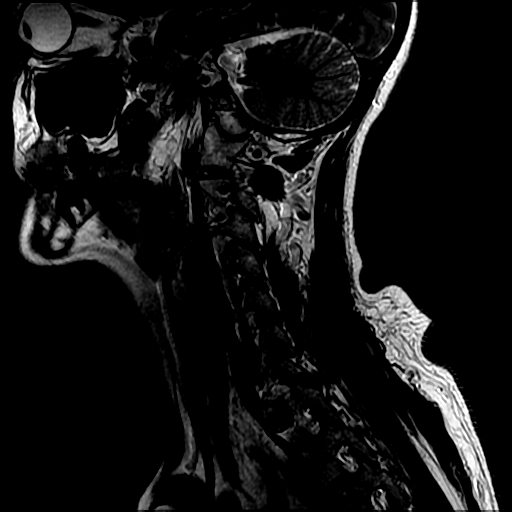

[Series 3: T1 · sagittal · 3.0mm · 0.43mm/px · 3 of 12 slices shown]
[im 1/12]
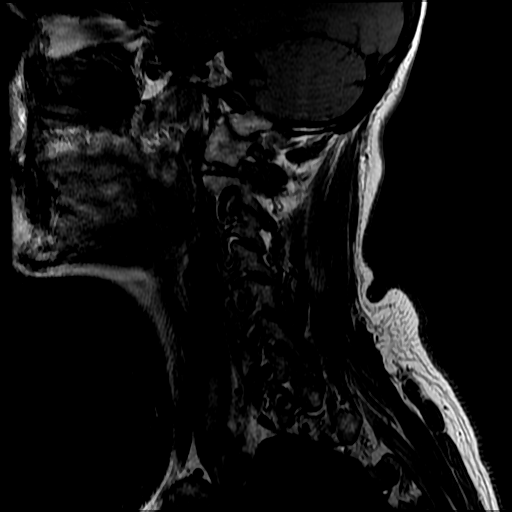
[im 6/12]
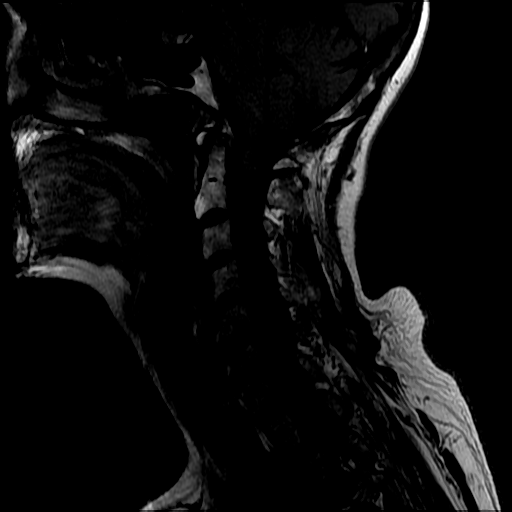
[im 12/12]
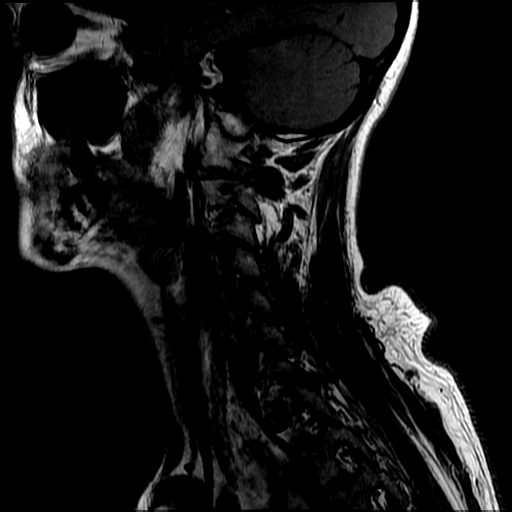

[Series 4: sag ir · sagittal · 3.0mm · 0.43mm/px · 3 of 12 slices shown]
[im 3/12]
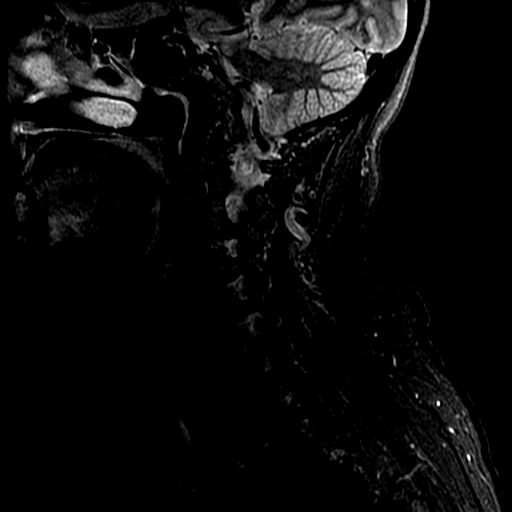
[im 7/12]
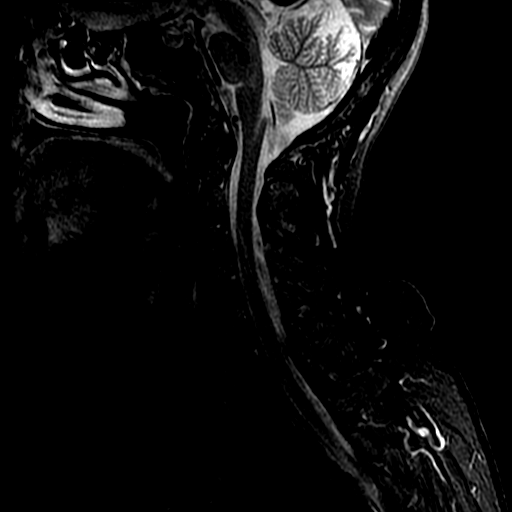
[im 12/12]
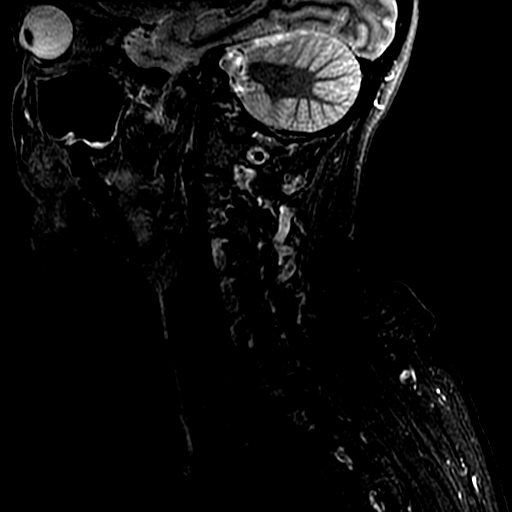

[Series 6: T2 · axial · 3.0mm · 0.39mm/px · z∈[-147,-59]mm · 7 of 34 slices shown (2 of 2)]
[im 3/34]
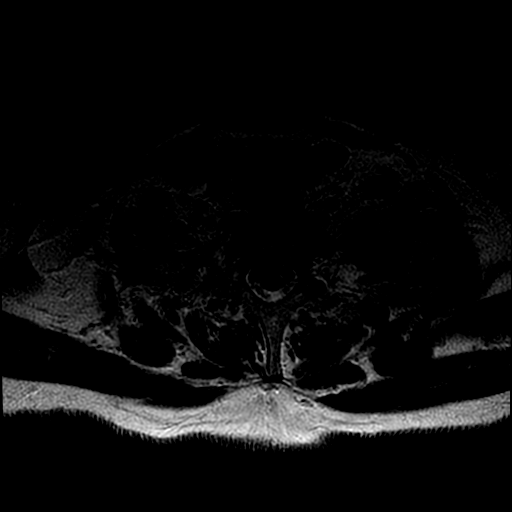
[im 5/34]
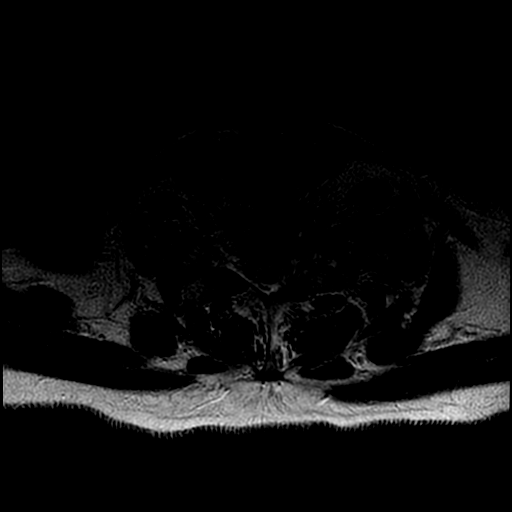
[im 7/34]
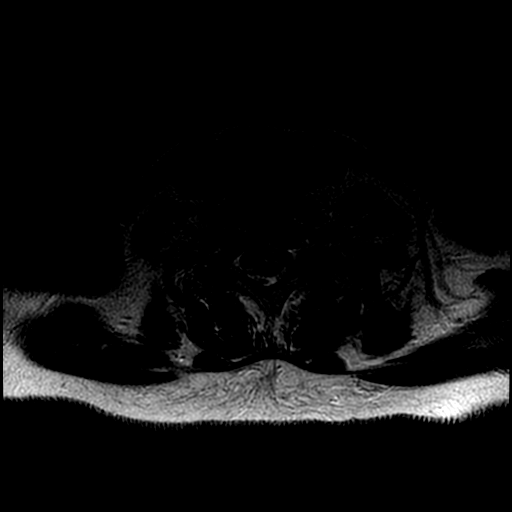
[im 12/34]
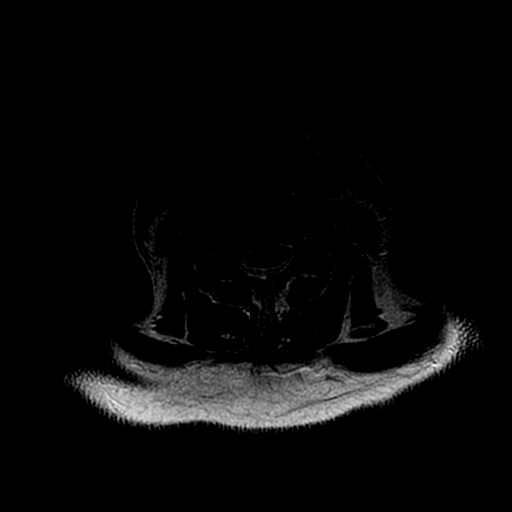
[im 16/34]
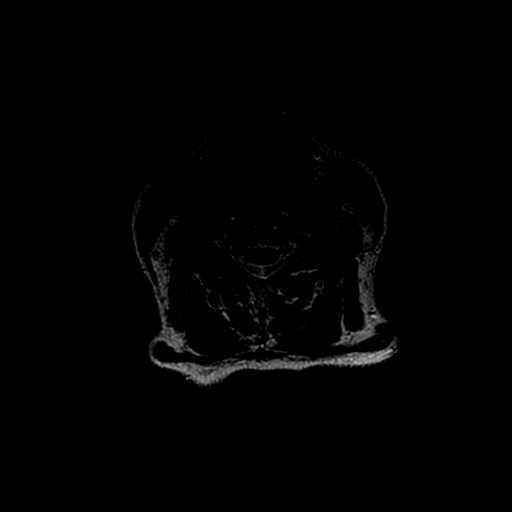
[im 18/34]
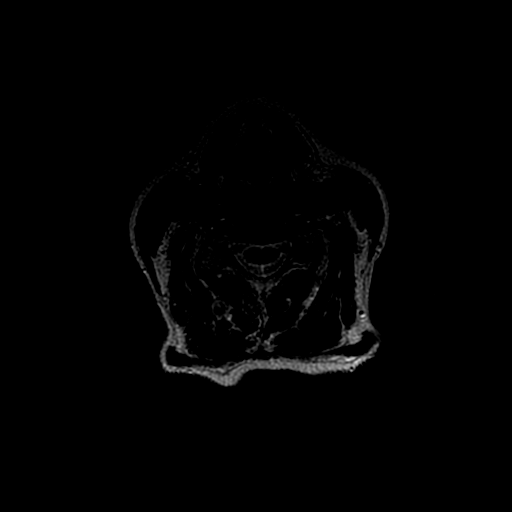
[im 29/34]
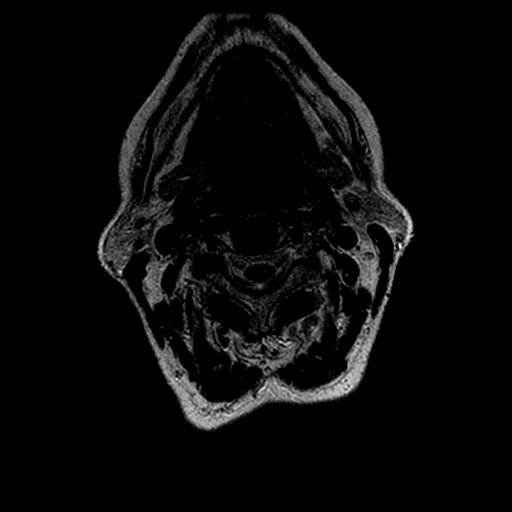

[18 of 48 positions shown; findings below may reference images not displayed]

FINDINGS: Alignment: Slight retrolisthesis is present at C6-7. Alignment is
otherwise anatomic.

Vertebrae: A hemangioma is present anteriorly at CT. There is mild
fatty infiltration of the marrow space. Mild endplate marrow changes
are present at C6-7. Vertebral body heights are maintained.

Cord: Normal signal is present in the cervical and upper thoracic
spinal cord to the lowest imaged level, T2-3.

Posterior Fossa, vertebral arteries, paraspinal tissues: The
craniocervical junction is within normal limits. The visualized
intracranial contents are normal. Flow is present in the vertebral
arteries bilaterally.

Disc levels:

C2-3: Mild facet hypertrophy is worse on the right. There is no
significant stenosis.

C3-4: A mild rightward disc osteophyte complex is present.
Asymmetric right-sided facet hypertrophy is noted as well. There is
no significant stenosis.

C4-5: Asymmetric right-sided facet hypertrophy is present. There is
no significant stenosis.

C5-6: A leftward disc osteophyte complex partially effaces the
ventral CSF. Mild left foraminal narrowing is evident.

C6-7: A broad-based disc osteophyte complex effaces the ventral CSF.
Moderate left and mild right foraminal stenosis is present.

C7-T1:  Negative.
IMPRESSION: 1. The most significant level is C6-7 with moderate left central and
left foraminal stenosis.
2. Mild right foraminal narrowing at C6-7.
3. Leftward disc osteophyte complex with mild left central and left
foraminal stenosis at C5-6.
4. Rightward disc osteophyte complex and asymmetric right-sided
facet hypertrophy at C3-4 without significant stenosis.

## 2018-01-16 IMAGING — MR MR MRA NECK WO/W CM
5 of 8 series · 19 of 48 positions shown · IV contrast (multihance)
Comparison: MRI brain and MRA head 12/18/2014

CLINICAL DATA: Unsteady gait.  Falling to the right.

EXAM:
MRA NECK WITHOUT AND WITH CONTRAST
TECHNIQUE: Multiplanar and multiecho pulse sequences of the neck were obtained
without and with intravenous contrast. Angiographic images of the
neck were obtained using MRA technique without and with intravenous
contrast.
CONTRAST:  13mL MULTIHANCE GADOBENATE DIMEGLUMINE 529 MG/ML IV SOLN

[Series 3: ax (id) · axial · 2.8mm · 0.47mm/px · z∈[-154,-4]mm · 8 of 108 slices shown]
[im 1/108]
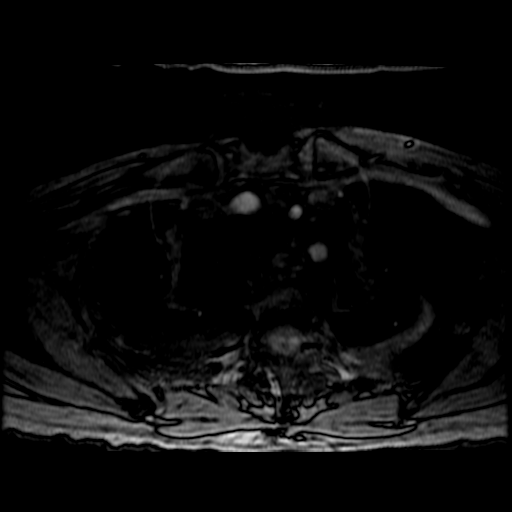
[im 16/108]
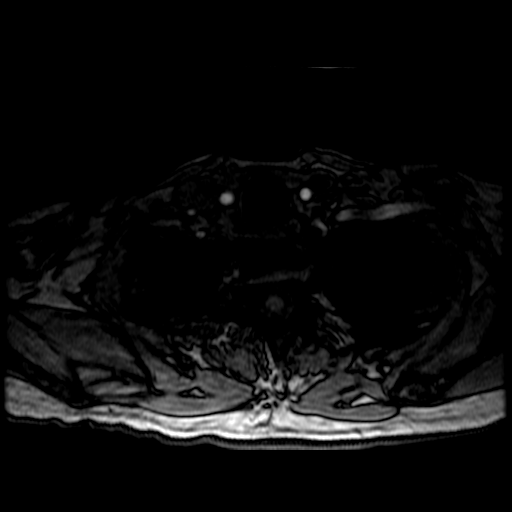
[im 31/108]
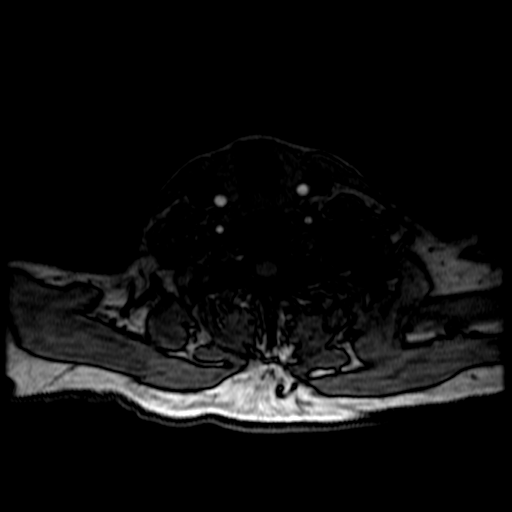
[im 46/108]
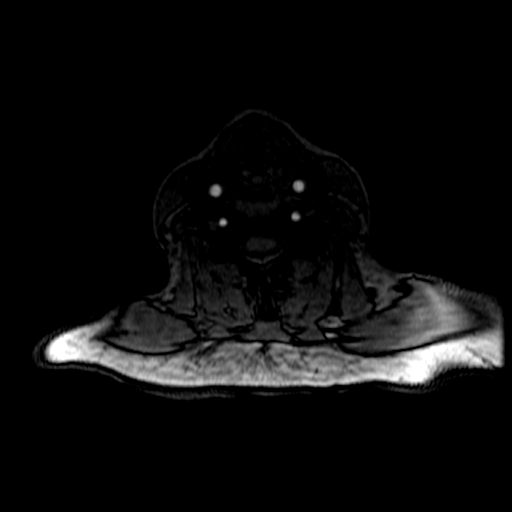
[im 62/108]
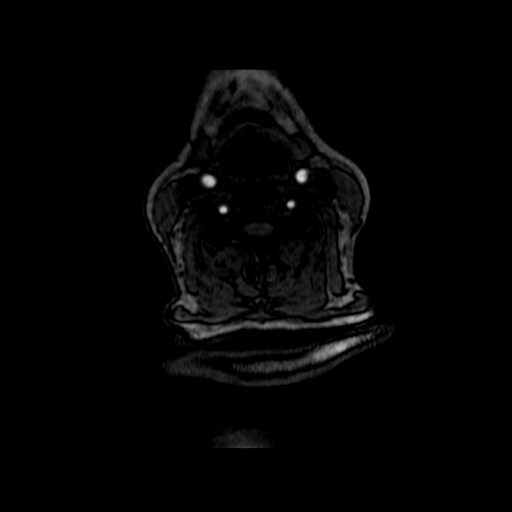
[im 77/108]
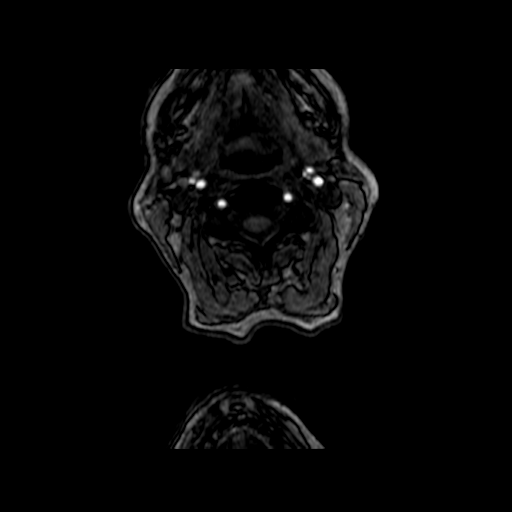
[im 92/108]
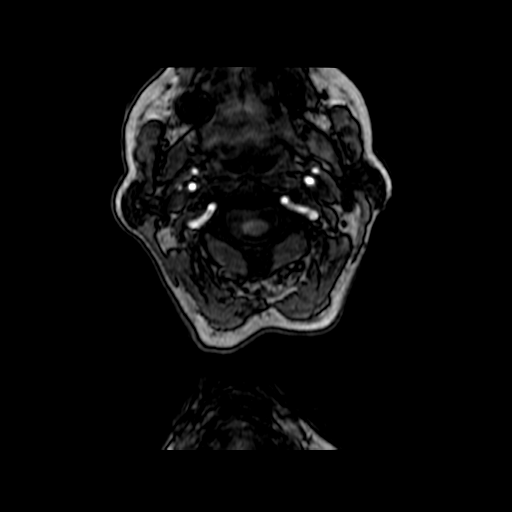
[im 108/108]
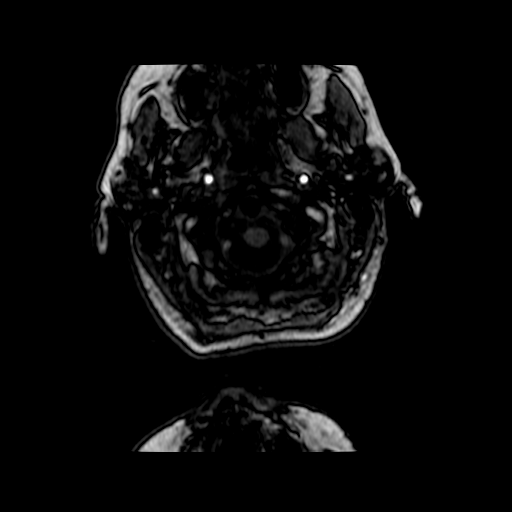

[Series 300: col:ax (id) · axial · 2.8mm · 0.47mm/px · 1 of 1 slices shown]
[im 1/1]
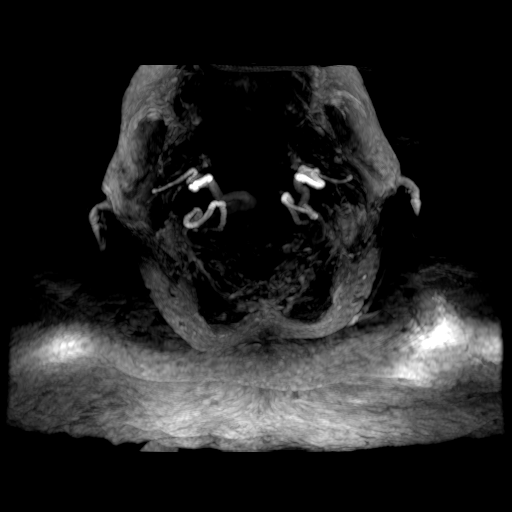

[Series 301: pjn:ax (id) · sagittal · 2.8mm · 0.47mm/px · 1 of 19 slices shown]
[im 1/19]
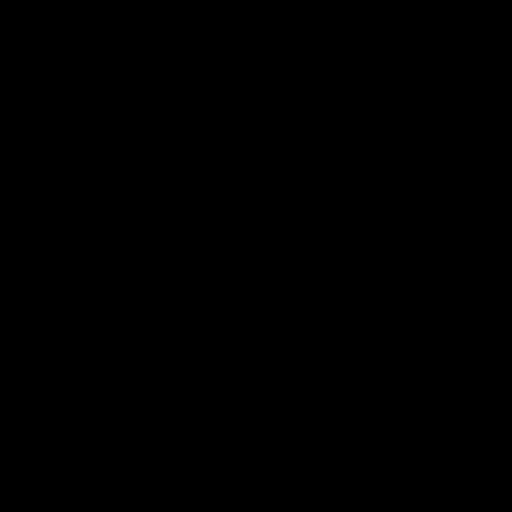

[Series 400: cor cemra ft · coronal · 1.4mm · 0.59mm/px · 8 of 96 slices shown]
[im 1/96]
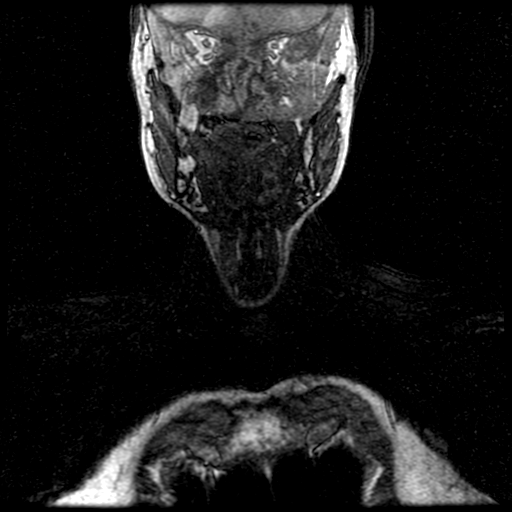
[im 14/96]
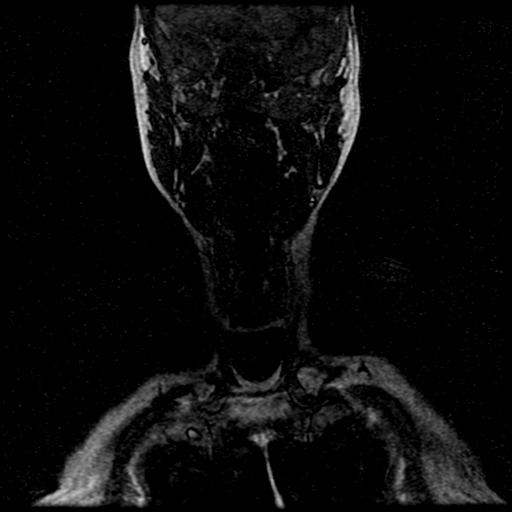
[im 28/96]
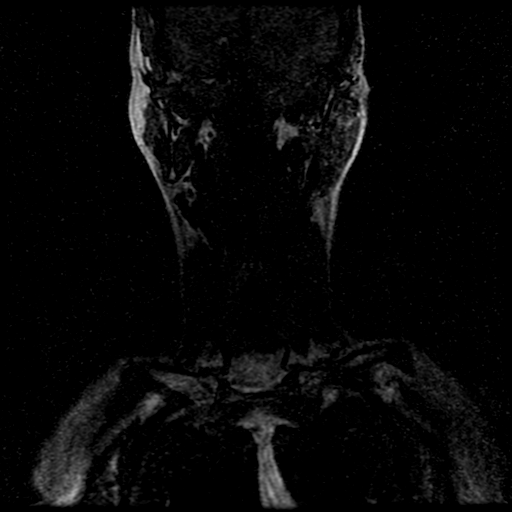
[im 41/96]
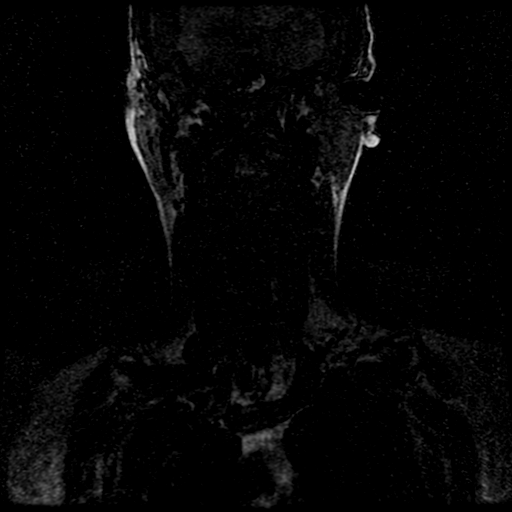
[im 55/96]
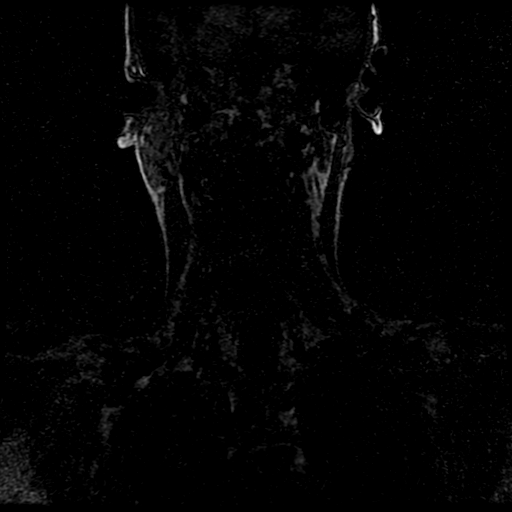
[im 68/96]
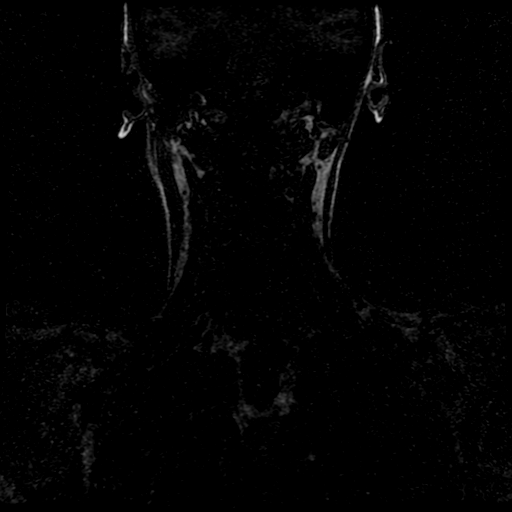
[im 82/96]
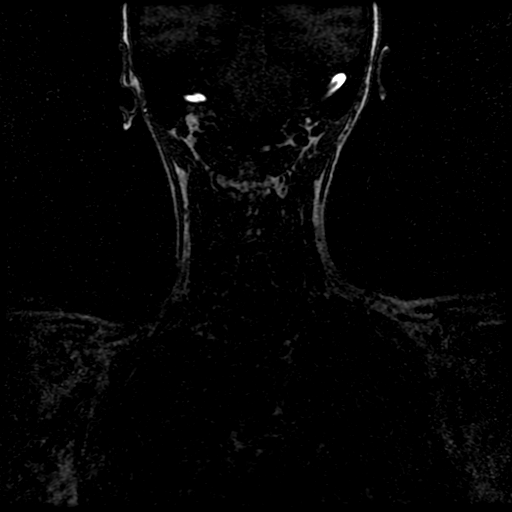
[im 96/96]
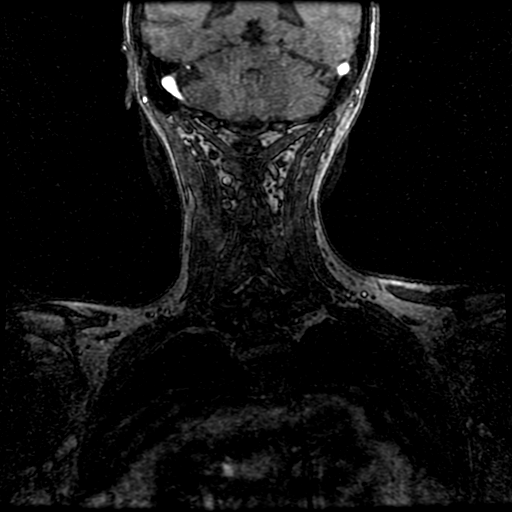

[Series 401: ph1/cor cemra ft · coronal · 1.4mm · 0.59mm/px · 1 of 96 slices shown]
[im 1/96]
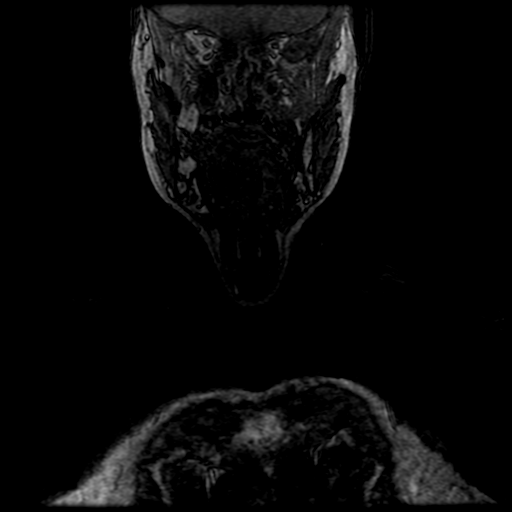

[19 of 48 positions shown; findings below may reference images not displayed]

FINDINGS: Time-of-flight images demonstrate no significant flow disturbance at
either carotid bifurcation. There is antegrade flow within the
vertebral arteries bilaterally.

A 3 vessel arch configuration is present without significant
proximal stenosis of the great vessels.

The right common carotid artery is within normal limits. The
bifurcation is unremarkable. There is focal irregularity in the
midcervical segment without significant stenosis. The more distal
vessel is within normal limits. Mild cavernous internal carotid
artery atherosclerotic disease is present without significant
stenosis.

The left common carotid artery is within normal limits. Minimal
irregularity is present at the left carotid bifurcation. There is no
significant stenosis. Focal irregularity is present in the mid
cervical left ICA without significant stenosis. Minimal
atherosclerotic changes are present within the cavernous left
internal carotid artery.

Both vertebral arteries originate from the subclavian arteries. The
vertebral arteries are codominant. The PICA origins are visualized
and normal. The basilar artery is within normal limits.
IMPRESSION: 1. Focal irregularity without significant stenosis in the mid
cervical internal carotid artery bilaterally. This is most
consistent with fibromuscular dysplasia.
2. Minimal atherosclerotic changes in the cavernous internal carotid
arteries bilaterally without significant stenosis.

## 2018-01-17 IMAGING — MR MR HEAD W/O CM
9 of 12 series · 34 of 48 positions shown · non-contrast
Comparison: 12/18/2015

CLINICAL DATA: Ataxia and frontal headache.  Prior TIA.

EXAM:
MRI HEAD WITHOUT CONTRAST
TECHNIQUE: Multiplanar, multiecho pulse sequences of the brain and surrounding
structures were obtained without intravenous contrast.

[Series 2: FLAIR · sagittal · 5.0mm · 0.47mm/px · 2 of 23 slices shown (1 of 2)]
[im 1/23]
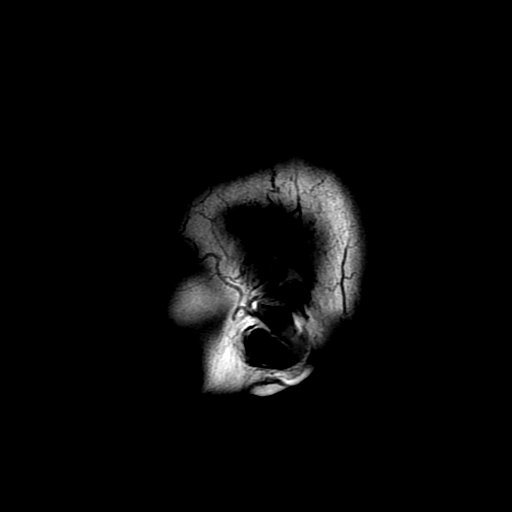
[im 23/23]
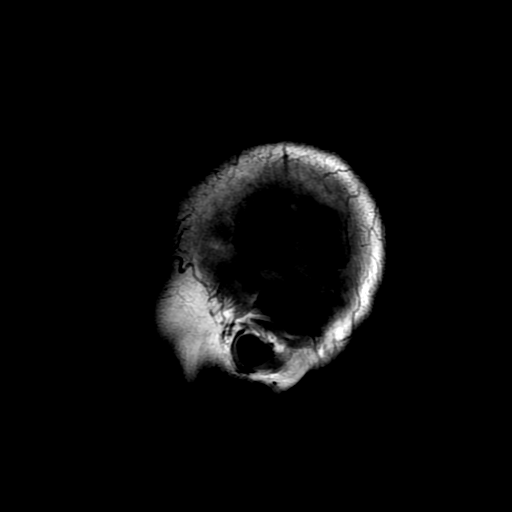

[Series 4: DWI · axial · 3.0mm · 0.94mm/px · z∈[-83,+63]mm · 8 of 100 slices shown (1 of 2)]
[im 1/100]
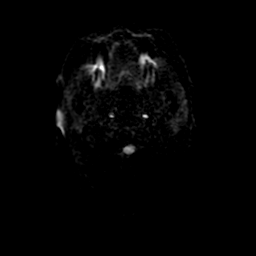
[im 15/100]
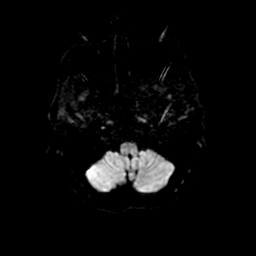
[im 29/100]
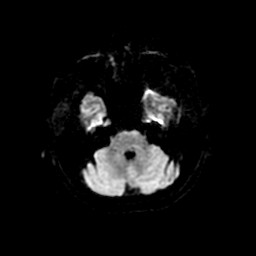
[im 43/100]
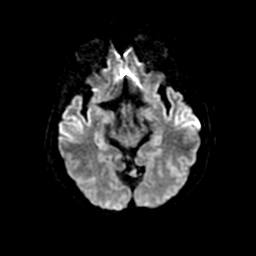
[im 57/100]
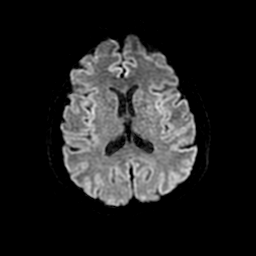
[im 71/100]
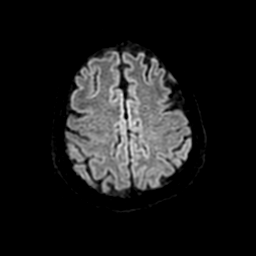
[im 85/100]
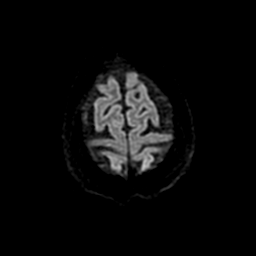
[im 100/100]
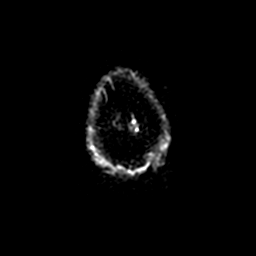

[Series 5: T2 · axial · 5.0mm · 0.47mm/px · z∈[-83,+61]mm · 2 of 25 slices shown (1 of 2)]
[im 1/25]
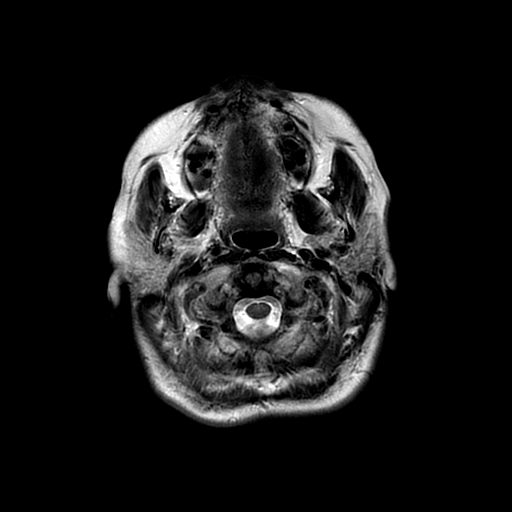
[im 25/25]
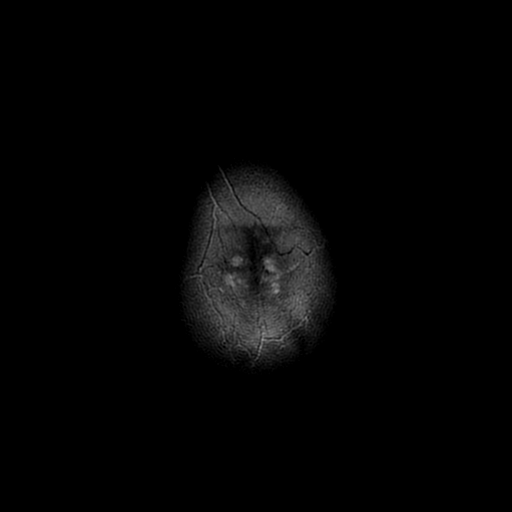

[Series 6: FLAIR · axial · 5.0mm · 0.47mm/px · z∈[-83,+61]mm · 2 of 25 slices shown (2 of 2)]
[im 1/25]
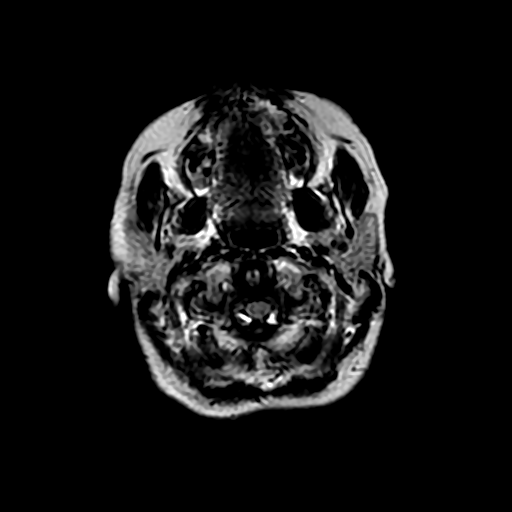
[im 25/25]
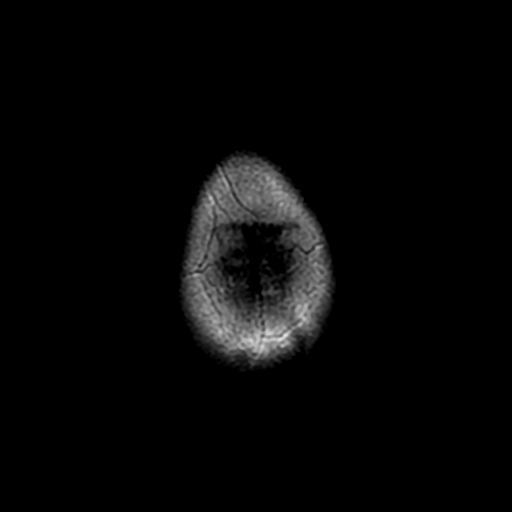

[Series 7: DWI · coronal · 4.0mm · 0.94mm/px · 6 of 72 slices shown (2 of 2)]
[im 1/72]
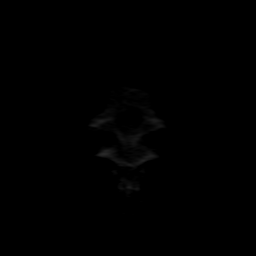
[im 15/72]
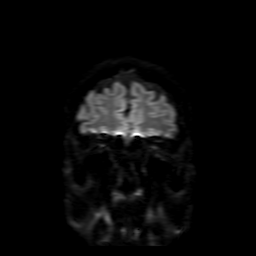
[im 29/72]
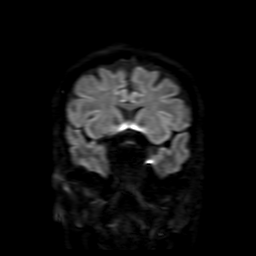
[im 43/72]
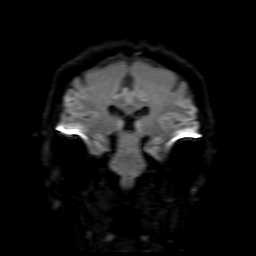
[im 57/72]
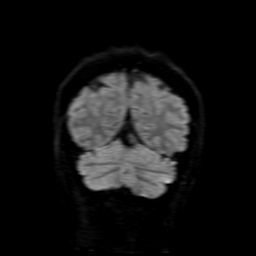
[im 72/72]
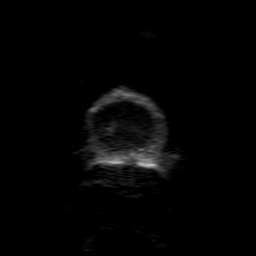

[Series 8: (person_name) · axial · 3.0mm · 0.47mm/px · z∈[-84,-21]mm · 4 of 100 slices shown]
[im 1/100]
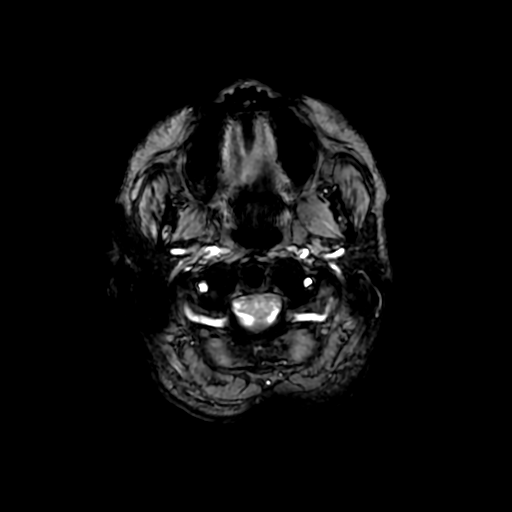
[im 15/100]
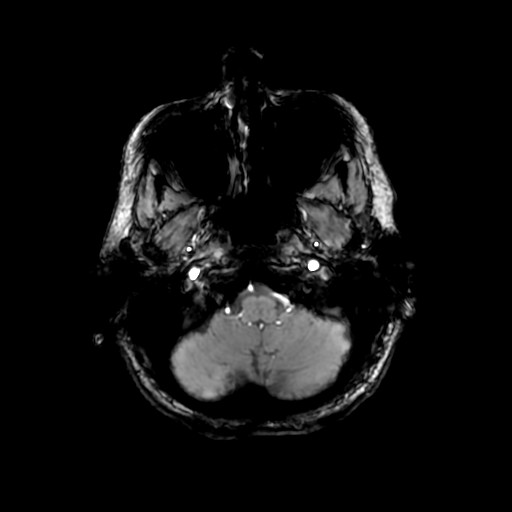
[im 29/100]
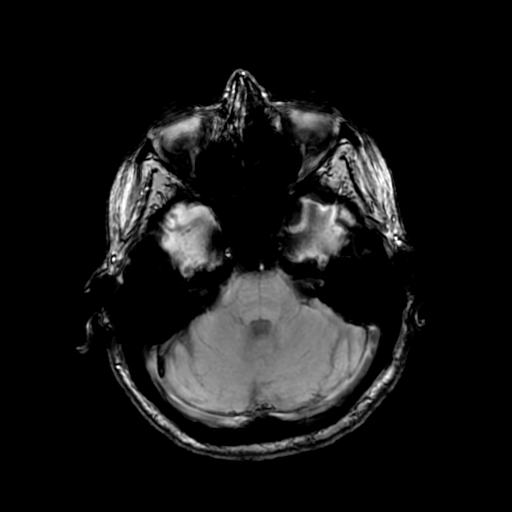
[im 43/100]
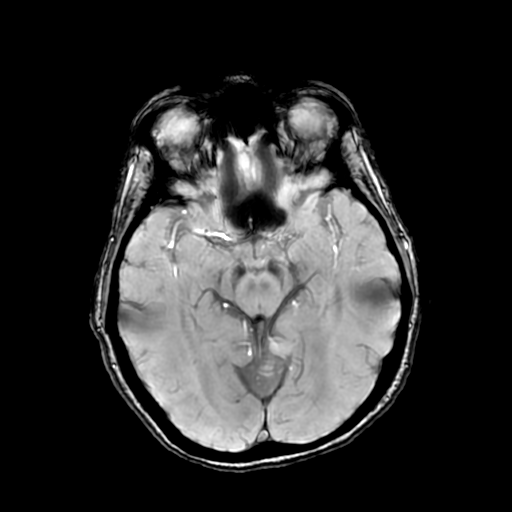

[Series 10: T2 · coronal · 5.0mm · 0.39mm/px · 3 of 30 slices shown (2 of 2)]
[im 1/30]
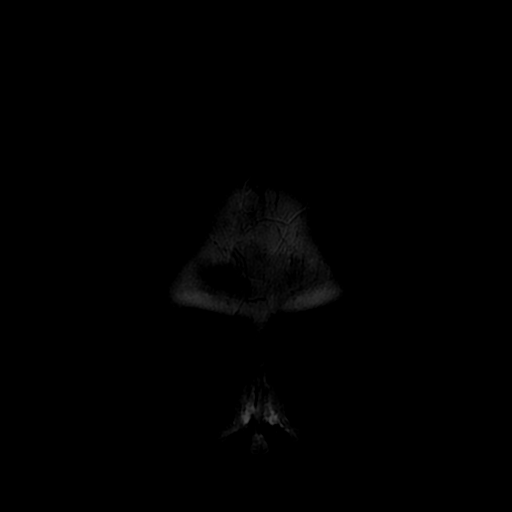
[im 15/30]
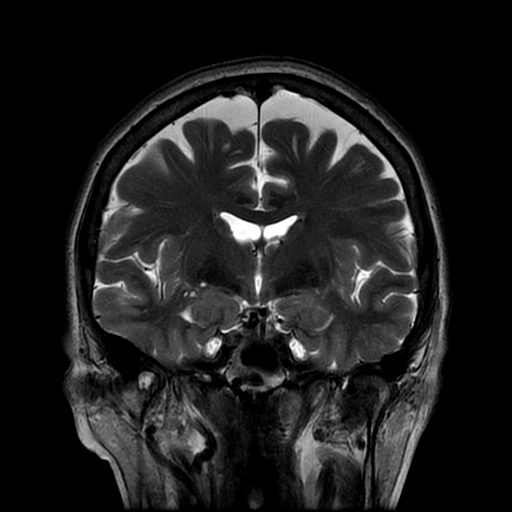
[im 30/30]
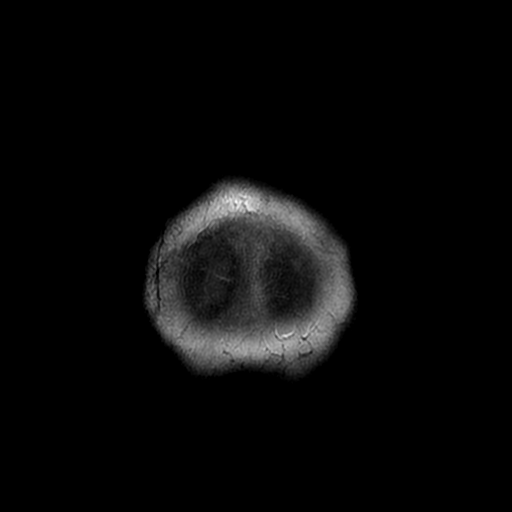

[Series 450: ADC · axial · 3.0mm · 0.94mm/px · z∈[-83,+63]mm · 4 of 50 slices shown (1 of 2)]
[im 1/50]
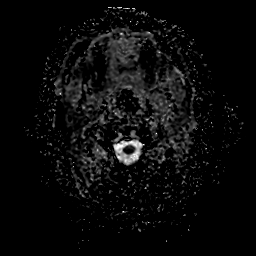
[im 17/50]
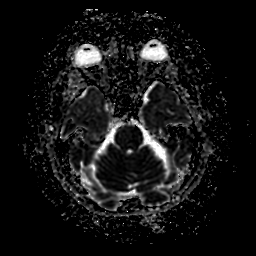
[im 33/50]
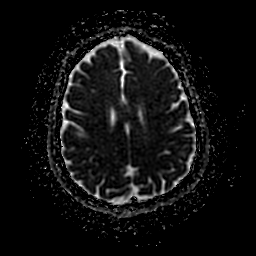
[im 50/50]
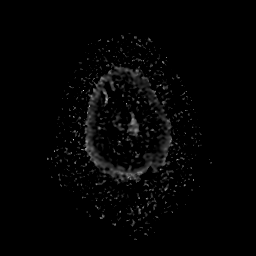

[Series 750: ADC · coronal · 4.0mm · 0.94mm/px · 3 of 36 slices shown (2 of 2)]
[im 1/36]
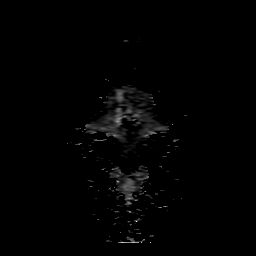
[im 18/36]
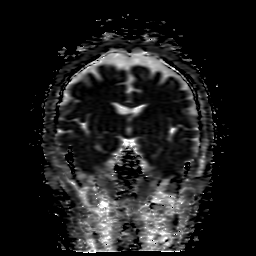
[im 36/36]
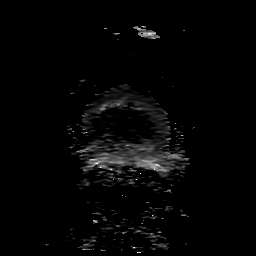

[34 of 48 positions shown; findings below may reference images not displayed]

FINDINGS: There is no evidence of acute infarct, mass, midline shift, or
extra-axial fluid collection. A focus of susceptibility artifact is
again seen in the left putamen and may reflect a chronic
microhemorrhage. Small foci of T2 hyperintensity scattered
throughout the cerebral white matter bilaterally are nonspecific and
unchanged. Ventricles and sulci are within normal limits for age.

Orbits are unremarkable. Paranasal sinuses and mastoid air cells are
clear. Major intracranial vascular flow voids are preserved. No
skull lesion identified.
IMPRESSION: 1. No acute intracranial abnormality.
2. Unchanged, mild cerebral white matter disease, nonspecific.

## 2018-01-19 ENCOUNTER — Telehealth: Payer: Self-pay | Admitting: Neurology

## 2018-01-19 NOTE — Telephone Encounter (Signed)
Late Entry:  Patient called on-call physician weekend, complains of unsteady gait, falling episode, reported this is chronic issues, I have advised her to call back to the office for worsening symptoms,

## 2018-01-24 ENCOUNTER — Other Ambulatory Visit: Payer: Self-pay

## 2018-01-24 ENCOUNTER — Emergency Department (HOSPITAL_COMMUNITY)
Admission: EM | Admit: 2018-01-24 | Discharge: 2018-01-24 | Disposition: A | Discharge status: Home / Self Care | Payer: Medicare Other | Attending: Emergency Medicine | Admitting: Emergency Medicine

## 2018-01-24 ENCOUNTER — Emergency Department (HOSPITAL_COMMUNITY): Payer: Medicare Other

## 2018-01-24 ENCOUNTER — Encounter (HOSPITAL_COMMUNITY): Payer: Self-pay | Admitting: Emergency Medicine

## 2018-01-24 DIAGNOSIS — R079 Chest pain, unspecified: Secondary | ICD-10-CM | POA: Diagnosis present

## 2018-01-24 DIAGNOSIS — Z79899 Other long term (current) drug therapy: Secondary | ICD-10-CM | POA: Diagnosis not present

## 2018-01-24 DIAGNOSIS — F17210 Nicotine dependence, cigarettes, uncomplicated: Secondary | ICD-10-CM | POA: Diagnosis not present

## 2018-01-24 DIAGNOSIS — R0789 Other chest pain: Secondary | ICD-10-CM | POA: Diagnosis not present

## 2018-01-24 LAB — CBC
HCT: 41.4 % (ref 36.0–46.0)
HEMOGLOBIN: 12.9 g/dL (ref 12.0–15.0)
MCH: 28 pg (ref 26.0–34.0)
MCHC: 31.2 g/dL (ref 30.0–36.0)
MCV: 90 fL (ref 78.0–100.0)
Platelets: 318 10*3/uL (ref 150–400)
RBC: 4.6 MIL/uL (ref 3.87–5.11)
RDW: 14.6 % (ref 11.5–15.5)
WBC: 5.4 10*3/uL (ref 4.0–10.5)

## 2018-01-24 LAB — BASIC METABOLIC PANEL
ANION GAP: 8 (ref 5–15)
BUN: 16 mg/dL (ref 8–23)
CO2: 23 mmol/L (ref 22–32)
Calcium: 9.2 mg/dL (ref 8.9–10.3)
Chloride: 112 mmol/L — ABNORMAL HIGH (ref 98–111)
Creatinine, Ser: 0.93 mg/dL (ref 0.44–1.00)
Glucose, Bld: 101 mg/dL — ABNORMAL HIGH (ref 70–99)
POTASSIUM: 4.4 mmol/L (ref 3.5–5.1)
Sodium: 143 mmol/L (ref 135–145)

## 2018-01-24 LAB — I-STAT TROPONIN, ED: TROPONIN I, POC: 0 ng/mL (ref 0.00–0.08)

## 2018-01-24 NOTE — ED Notes (Signed)
Pt given apple juice and ginger ale as requested

## 2018-01-24 NOTE — ED Provider Notes (Signed)
[facility] DEPT Provider Note   CSN: [id number] Arrival date & time: 01/24/18  1201     History   Chief Complaint Chief Complaint  Patient presents with  . Chest Pain    HPI Dorothy White is a 66 y.o. female.  She states she had a syncopal event about a week ago in which she fell and used her face and she thinks she might of broken a rib.  She has had left-sided chest wall pain since then.  She served worse with twisting turning and taking deep breath.  She is tried Tylenol and ibuprofen with no relief.  She is asking for something stronger for pain.  On review she was here about 10 days ago for worsening of her chronic back and hip pain and ask for narcotics none.  She was not given any and there was mention of that she is under a pain contract.  The history is provided by the patient.  Chest Pain   This is a new problem. The current episode started more than 1 week ago. The problem occurs constantly. The problem has not changed since onset.The pain is associated with movement and coughing. The pain is present in the lateral region. The pain is severe. The quality of the pain is described as stabbing. The pain does not radiate. Pertinent negatives include no abdominal pain, no diaphoresis, no dizziness, no fever, no hemoptysis, no palpitations, no shortness of breath, no sputum production and no vomiting. Treatments tried: tylenol/ibuprofen. The treatment provided no relief.  Her past medical history is significant for CAD and hyperlipidemia.    Past Medical History:  Diagnosis Date  . Anemia    ?  Marland Kitchen Anxiety   . Arthritis    "everywhere" (04/25/2016)  . 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 Chronic back pain    "all over" (04/25/2016)  . Constipation   . Depression   . Diverticulitis   . GERD (gastroesophageal reflux disease)   . Headache    "weekly" (04/25/2016)  . 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 HLD (hyperlipidemia)    hx (04/25/2016)   . 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 Mild CAD    a. 25% mLAD, otherwise no sig disease 01/2016 cath.  Marland Kitchen NICM (nonischemic cardiomyopathy) ([place])    a. EF 40-45% by echo 08/4266 at time of complicated migraine/neuro sx, 55-65% at time of cath 01/2016  . Stroke [facility]) 06/2013   "mini" stroke , ?possibly hemaplegic migraine per pt    Patient Active Problem List   Diagnosis Date Noted  . Recurrent falls 04/16/2017  . 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 At risk for adverse drug event 04/07/2017  . Acute blood loss as cause of postoperative anemia 04/04/2017  . Acute delirium 04/04/2017  . Osteoporosis 04/02/2017  . Closed fracture of neck of left femur ([place]) 03/31/2017  . Scalp contusion 03/31/2017  . Fall at home, initial encounter 03/31/2017  . Hip fracture ([place]) 03/31/2017  . 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 Hx of transient ischemic attack (TIA) 02/19/2017  . Left carotid stenosis- 40-59% 02/19/2017  . Malnutrition of moderate degree 02/18/2017  . Falls 02/17/2017  . Chest pain 09/26/2016  . Diverticulitis of sigmoid colon 04/25/2016  . Infection due to ESBL-producing Escherichia coli/Diverticular Abscess 03/15/2016  . Diverticulitis 03/12/2016  . Chronic pain syndrome 03/12/2016  . Lesion of pancreas 03/12/2016  . History of  chest pain 03/12/2016  . Hypokalemia 03/12/2016  . Angina, class IV ([place]) - chest tightness and pressure with dyspnea with minimal exertion 02/17/2016    Class: Question of  . TIA (transient ischemic attack) 12/26/2015  . DOE (dyspnea on exertion) 12/26/2015  . Right sided weakness 12/17/2015  . Ataxia 12/17/2015  . Shoulder fracture 02/15/2015  . Chronic headache 10/11/2013  . Weakness generalized 08/12/2013  . Dizziness and giddiness 08/12/2013  . Abnormality of gait 07/11/2013  . Hyperlipidemia 05/07/2007  . Migraine without aura 05/07/2007  . PREMATURE VENTRICULAR  CONTRACTIONS, FREQUENT 05/07/2007  . GERD 05/07/2007  . DIVERTICULOSIS, COLON 05/07/2007  . 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 OSTEOPENIA 05/07/2007  . Anxiety and depression 03/24/2007  . HEMORRHOIDS 03/24/2007  . ALLERGIC RHINITIS 03/24/2007  . LOW BACK PAIN 03/24/2007  . MIGRAINES, HX OF 03/24/2007    Past Surgical History:  Procedure Laterality Date  . ANTERIOR APPROACH HEMI HIP ARTHROPLASTY Left 04/01/2017   Procedure: LEFT DIRECT ANTERIOR TOTAL HIP REPLACEMENT;  Surgeon: Leandrew Koyanagi, MD;  Location: [facility];  Service: Orthopedics;  Laterality: Left;  LEFT DIRECT ANTERIOR TOTAL HIP REPLACEMENT  . AUGMENTATION MAMMAPLASTY  1980  . BASAL CELL CARCINOMA EXCISION     "tip of my nose"  . BUNIONECTOMY Bilateral 10/2003  . CARDIAC CATHETERIZATION N/A 02/20/2016   Procedure: Left Heart Cath and Coronary Angiography;  Surgeon: Jettie Booze, MD;  Location: [place] CV LAB;  Service: Cardiovascular;  Laterality: N/A;  . COLONOSCOPY    . DILATION AND CURETTAGE OF UTERUS    . FRACTURE SURGERY    . IR GENERIC HISTORICAL  03/18/2016   IR SINUS/FIST TUBE CHK-NON GI 03/18/2016 Aletta Edouard, MD [facility]-INTERV RAD  . LAPAROSCOPIC SIGMOID COLECTOMY N/A 04/25/2016   Procedure: LAPAROSCOPIC SIGMOID COLECTOMY;  Surgeon: Stark Klein, MD;  Location: [facility];  Service: General;  Laterality: N/A;  . OOPHORECTOMY Bilateral ~ 1999  . ORIF HUMERUS FRACTURE Right 02/15/2015   Procedure: OPEN REDUCTION INTERNAL FIXATION (ORIF) PROXIMAL HUMERUS FRACTURE;  Surgeon: Netta Cedars, MD;  Location: [place];  Service: Orthopedics;  Laterality: Right;  . RHINOPLASTY  1976  . TONSILLECTOMY    . TUBAL LIGATION  ~ 1983  . VAGINAL HYSTERECTOMY  ~ 1998     OB History   None      Home Medications    Prior to Admission medications   Medication Sig Start Date End Date Taking? Authorizing Provider  clonazePAM (KLONOPIN) 1 MG tablet Take 1 mg by mouth 2 (two) times daily as needed for anxiety.     [provider]  FLUoxetine (PROZAC) 20 MG capsule Take 60 mg by mouth daily.  01/15/18   [provider]  FLUoxetine (PROZAC) 20 MG tablet Take 3 tablets (60 mg total) by mouth every morning. Take 3 tablets to = 60 mg qam Patient not taking: Reported on 01/15/2018 04/22/17   Medina-Vargas, Monina C, NP  ibuprofen (ADVIL,MOTRIN) 600 MG tablet Take 1 tablet (600 mg total) by mouth every 6 (six) hours as needed. Patient not taking: Reported on 01/15/2018 05/10/17   Shary Decamp, PA-C  LUTEIN PO Take 1 capsule by mouth daily.     [provider]  Magnesium 500 MG TABS Take 500 mg by mouth at bedtime.    [provider]  methocarbamol (ROBAXIN) 500 MG tablet Take 1 tablet (500 mg total) by mouth 2 (two) times daily. 01/15/18   Lacretia Leigh, MD  mometasone (NASONEX) 50 MCG/ACT nasal spray Place  2 sprays into the nose daily. Patient not taking: Reported on 01/15/2018 04/22/17   Medina-Vargas, Monina C, NP  omeprazole (PRILOSEC) 40 MG capsule Take 40 mg by mouth daily. 06/22/17   [provider]  ondansetron (ZOFRAN) 8 MG tablet Take 1 tablet (8 mg total) by mouth every 8 (eight) hours as needed for nausea or vomiting. Patient not taking: Reported on 01/15/2018 04/22/17   Medina-Vargas, Monina C, NP  oxyCODONE-acetaminophen (PERCOCET) 10-325 MG tablet Take 1 tablet by mouth every 6 (six) hours as needed for pain.  12/31/17   [provider]  SUMAtriptan (IMITREX) 100 MG tablet TAKE 1/2 TO 1 TABLET BY MOUTH TWICE DAILY AS NEEDED FOR MIGRAINE 12/29/17   [provider]  tiZANidine (ZANAFLEX) 4 MG tablet Take 4 mg by mouth 3 (three) times daily as needed for muscle spasms.  12/28/17   [provider]  topiramate (TOPAMAX) 100 MG tablet Take 1 tablet (100 mg total) by mouth at bedtime. 12/28/17   Kathrynn Ducking, MD  traZODone (DESYREL) 100 MG tablet Take 200 mg by mouth at bedtime. 12/29/17   [provider]  trazodone (DESYREL) 300 MG tablet Take 1  tablet (300 mg total) by mouth at bedtime as needed for sleep. Patient not taking: Reported on 01/15/2018 04/22/17   Medina-Vargas, Monina C, NP  vitamin B-12 (CYANOCOBALAMIN) 1000 MCG tablet Take 1 tablet (1,000 mcg total) by mouth daily. 02/19/17   Debbe Odea, MD    Family History Family History  Problem Relation Age of Onset  . Lung cancer Mother 56  . Migraines Mother   . Heart attack Father        Vague  . Hypertension Brother   . Esophageal cancer Brother   . Diabetes Paternal Grandmother        questionable  . Colon cancer Neg Hx   . Kidney disease Neg Hx   . Liver disease Neg Hx     Social History Social History   Tobacco Use  . Smoking status: Current Some Day Smoker    Packs/day: 1.00    Years: 34.00    Pack years: 34.00    Types: Cigarettes    Last attempt to quit: 02/03/2017    Years since quitting: 0.9  . Smokeless tobacco: Never Used  . Tobacco comment: 10 per week or less  Substance Use Topics  . Alcohol use: Yes    Alcohol/week: 0.6 oz    Types: 1 Standard drinks or equivalent per week    Comment: occ   . Drug use: No    Types: Cocaine    Comment: Denies 11-17-17     Allergies   Opana [oxymorphone hcl] and Penicillins   Review of Systems Review of Systems  Constitutional: Negative for diaphoresis and fever.  HENT: Negative for sore throat.   Eyes: Negative for visual disturbance.  Respiratory: Negative for hemoptysis, sputum production and shortness of breath.   Cardiovascular: Positive for chest pain. Negative for palpitations.  Gastrointestinal: Negative for abdominal pain and vomiting.  Genitourinary: Negative for dysuria.  Musculoskeletal: Negative for neck pain.  Skin: Negative for rash.  Neurological: Negative for dizziness.     Physical Exam Updated Vital Signs BP (!) 130/53 (BP Location: Left Arm)   Pulse 86   Temp 98.6 F (37 C) (Oral)   Resp (!) 22   Ht 5\' 5"  (1.651 m)   Wt 50.8 kg (112 lb)   SpO2 97%   BMI 18.64 kg/m  Physical Exam  Constitutional: She appears well-developed and well-nourished.  HENT:  Head: Normocephalic and atraumatic.  Eyes: Conjunctivae are normal.  Neck: Neck supple.  Cardiovascular: Normal rate, regular rhythm and normal pulses.  Pulmonary/Chest: Effort normal. She has no wheezes. She has no rales.  Abdominal: Soft. She exhibits no mass. There is no tenderness. There is no guarding.  Musculoskeletal:       Right lower leg: Normal. She exhibits no tenderness and no edema.       Left lower leg: Normal. She exhibits no tenderness and no edema.  Neurological: She is alert. GCS eye subscore is 4. GCS verbal subscore is 5. GCS motor subscore is 6.  Skin: Skin is warm and dry. Capillary refill takes less than 2 seconds.  Psychiatric: She has a normal mood and affect.     ED Treatments / Results  Labs (all labs ordered are listed, but only abnormal results are displayed) Labs Reviewed  BASIC METABOLIC PANEL - Abnormal; Notable for the following components:      Result Value   Chloride 112 (*)    Glucose, Bld 101 (*)    All other components within normal limits  CBC  I-STAT TROPONIN, ED    EKG EKG Interpretation  Date/Time:  Sunday January 24 2018 12:16:15 EDT Ventricular Rate:  84 PR Interval:    QRS Duration: 72 QT Interval:  381 QTC Calculation: 451 R Axis:   66 Text Interpretation:  Sinus rhythm Probable left atrial enlargement Abnormal T, consider ischemia, lateral leads poor baseline commpared with 4/19 Confirmed by Aletta Edouard [phone]) on 01/24/2018 1:26:09 PM   Radiology Dg Chest 2 View  Result Date: 01/24/2018 CLINICAL DATA:  Chest pain and shortness of breath EXAM: CHEST - 2 VIEW COMPARISON:  June 29, 2017 FINDINGS: The heart size and mediastinal contours are within normal limits. Both lungs are clear. The visualized skeletal structures are unremarkable. IMPRESSION: No active cardiopulmonary disease. Electronically Signed   By: Dorise Bullion III M.D    On: 01/24/2018 13:41    Procedures Procedures (including critical care time)  Medications Ordered in ED Medications - No data to display   Initial Impression / Assessment and Plan / ED Course  I have reviewed the triage vital signs and the nursing notes.  Pertinent labs & imaging results that were available during my care of the patient were reviewed by me and considered in my medical decision making (see chart for details).  Clinical Course as of Jan 26 1002  Sun Jan 24, 2018  1341 Patient with chest pain that seems to be chest wall pain.  Her troponin is normal and she had chest pain for over a week.  I do not think a second troponin would add anything to this picture.   [MB]  0737 Reviewed the chest x-ray troponin and the rest of the labs with the patient.  She understands that the main treatment would be Tylenol and ibuprofen and time to rule out her chest wall to heal.  Recommended that she follow-up with her primary care doctor for continued management.   [MB]    Clinical Course User Index [MB] Hayden Rasmussen, MD     Final Clinical Impressions(s) / ED Diagnoses   Final diagnoses:  Chest wall pain    ED Discharge Orders    None       Hayden Rasmussen, MD 01/26/18 1004

## 2018-01-24 NOTE — ED Triage Notes (Signed)
Patient here from home with complaints of chest pain and syncopal episode x1 week.  Bruising noted to face. Chest pain under left breast "I think it may be a rib fracture".

## 2018-01-24 NOTE — ED Notes (Signed)
Bed: WA04 Expected date:  Expected time:  Means of arrival:  Comments: 

## 2018-01-24 NOTE — Discharge Instructions (Signed)
You were evaluated in the emergency department for 1 week of left-sided chest pain after a fall.  You had a chest x-ray EKG and blood work that did not show an obvious cause of your pain.  This is likely a rib fracture or some strain in your chest wall and will need to heal over time.  You should take Tylenol or ibuprofen for your pain.  Please follow-up with your primary care doctor.

## 2018-01-27 NOTE — Telephone Encounter (Signed)
Pt called she request a call to discuss the MRI's. Phone rep told her Dr Viona Gilmore has spoken to her on 6/6 regarding those results and the recommendation regarding PT and that she had called back to say she wanted to move forward with PT. Pt said she does not remember talking with anyone about the results. Pt also said she has not been contacted regarding PT. Please call to advise

## 2018-01-27 NOTE — Telephone Encounter (Signed)
Called and spoke to patient. Patient will call [facility] to schedule . [facility] is down in scheduler's . Patient understood and she will call.

## 2018-01-27 NOTE — Telephone Encounter (Signed)
I called the patient.  MRI of the brain and lumbar spine did not show an etiology for her walking problem.  She will be getting into physical therapy in the near future.

## 2018-01-31 IMAGING — NM NM MISC PROCEDURE
3 series · 18 of 18 positions shown · non-contrast
Comparison: none

[Series 1: wbr_s-proj_st stress_(id)_sa · 6.5mm · 6.51mm/px · 6 of 64 frames shown (1 of 2)]
[frame 6/64]
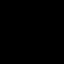
[frame 16/64]
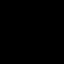
[frame 27/64]
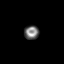
[frame 38/64]
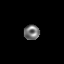
[frame 48/64]
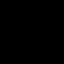
[frame 59/64]
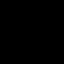

[Series 1: wbr_r-proj_st rest_(id)_sa · 6.5mm · 6.51mm/px · 6 of 64 frames shown]
[frame 6/64]
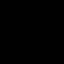
[frame 16/64]
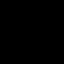
[frame 27/64]
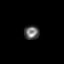
[frame 38/64]
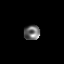
[frame 48/64]
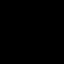
[frame 59/64]
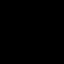

[Series 1: wbr_s-proj_st stress_(id)_sa · 6.5mm · 6.51mm/px · 6 of 512 frames shown (2 of 2)]
[frame 43/512]
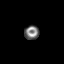
[frame 128/512]
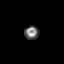
[frame 214/512]
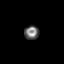
[frame 299/512]
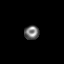
[frame 384/512]
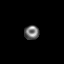
[frame 470/512]
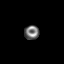

[18 of 18 positions shown; findings below may reference images not displayed]

Canned report from images found in remote index.

Refer to host system for actual result text.

## 2018-02-10 ENCOUNTER — Ambulatory Visit: Payer: Medicare Other

## 2018-02-10 ENCOUNTER — Ambulatory Visit: Payer: Medicare Other | Admitting: Physical Therapy

## 2018-02-12 ENCOUNTER — Other Ambulatory Visit: Payer: Self-pay | Admitting: Neurology

## 2018-02-12 DIAGNOSIS — G43419 Hemiplegic migraine, intractable, without status migrainosus: Secondary | ICD-10-CM

## 2018-02-13 ENCOUNTER — Other Ambulatory Visit: Payer: Self-pay

## 2018-02-13 ENCOUNTER — Emergency Department (HOSPITAL_COMMUNITY): Payer: Medicare Other

## 2018-02-13 ENCOUNTER — Emergency Department (HOSPITAL_COMMUNITY)
Admission: EM | Admit: 2018-02-13 | Discharge: 2018-02-14 | Disposition: A | Discharge status: Home / Self Care | Payer: Medicare Other | Attending: Emergency Medicine | Admitting: Emergency Medicine

## 2018-02-13 ENCOUNTER — Encounter (HOSPITAL_COMMUNITY): Payer: Self-pay | Admitting: *Deleted

## 2018-02-13 DIAGNOSIS — Z8673 Personal history of transient ischemic attack (TIA), and cerebral infarction without residual deficits: Secondary | ICD-10-CM | POA: Diagnosis not present

## 2018-02-13 DIAGNOSIS — S0083XA Contusion of other part of head, initial encounter: Secondary | ICD-10-CM

## 2018-02-13 DIAGNOSIS — Y9389 Activity, other specified: Secondary | ICD-10-CM | POA: Diagnosis not present

## 2018-02-13 DIAGNOSIS — F17210 Nicotine dependence, cigarettes, uncomplicated: Secondary | ICD-10-CM | POA: Diagnosis not present

## 2018-02-13 DIAGNOSIS — S63502A Unspecified sprain of left wrist, initial encounter: Secondary | ICD-10-CM | POA: Diagnosis not present

## 2018-02-13 DIAGNOSIS — Y929 Unspecified place or not applicable: Secondary | ICD-10-CM | POA: Insufficient documentation

## 2018-02-13 DIAGNOSIS — Z79899 Other long term (current) drug therapy: Secondary | ICD-10-CM | POA: Diagnosis not present

## 2018-02-13 DIAGNOSIS — Y998 Other external cause status: Secondary | ICD-10-CM | POA: Insufficient documentation

## 2018-02-13 DIAGNOSIS — Z85828 Personal history of other malignant neoplasm of skin: Secondary | ICD-10-CM | POA: Diagnosis not present

## 2018-02-13 DIAGNOSIS — S0993XA Unspecified injury of face, initial encounter: Secondary | ICD-10-CM | POA: Diagnosis present

## 2018-02-13 DIAGNOSIS — W19XXXA Unspecified fall, initial encounter: Secondary | ICD-10-CM | POA: Insufficient documentation

## 2018-02-13 MED ORDER — IBUPROFEN 800 MG PO TABS
800.0000 mg | ORAL_TABLET | Freq: Once | ORAL | Status: AC
  Administered 2018-02-13: 800 mg via ORAL
  Filled 2018-02-13: qty 1

## 2018-02-13 MED ORDER — MELOXICAM 7.5 MG PO TABS: 15.0000 mg | ORAL_TABLET | Freq: Every day | ORAL | 0 refills | Status: DC

## 2018-02-13 NOTE — Discharge Instructions (Signed)
Your xrays show no signs of fractures of either your face or your wrist.  Mobic twice daily as needed for pain  See your doctor for recheck in the next week as needed.

## 2018-02-13 NOTE — ED Provider Notes (Signed)
[facility] DEPT Provider Note   CSN: [id number] Arrival date & time: 02/13/18  2100     History   Chief Complaint Chief Complaint  Patient presents with  . Facial Pain    HPI Dorothy White is a 67 y.o. female.  HPI  67 year old female, she has a history of fall approximately 1 month ago, she does not recall exactly how she fell, she remembers that she got up off the ground and was hurting in her left wrist and the left side of her face as well as the left side of her ribs.  Again this was 1 month ago.  She has had 3 visits to the ER since that time for different complaints but states today that she wants to be evaluated for her left facial pain and her left wrist pain.  Her rib pain is almost completely resolved and she has no pain with deep breathing.  She is taking nothing for pain - it is worse with ROM of the wrist and with palpation over the L maxilofacial bones -she has no headache, no blurred vision, no nausea or vomiting.  Past Medical History:  Diagnosis Date  . Anemia    ?  Marland Kitchen Anxiety   . Arthritis    "everywhere" (04/25/2016)  . 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 Chronic back pain    "all over" (04/25/2016)  . Constipation   . Depression   . Diverticulitis   . GERD (gastroesophageal reflux disease)   . Headache    "weekly" (04/25/2016)  . 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 HLD (hyperlipidemia)    hx (04/25/2016)  . 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 Mild CAD    a. 25% mLAD, otherwise no sig disease 01/2016 cath.  Marland Kitchen NICM (nonischemic cardiomyopathy) ([place])    a. EF 40-45% by echo 10/6566 at time of complicated migraine/neuro sx, 55-65% at time of cath 01/2016  . Stroke [facility]) 06/2013   "mini" stroke , ?possibly hemaplegic migraine per pt    Patient Active Problem List   Diagnosis Date Noted  . Recurrent falls 04/16/2017  . History of total hip replacement,  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  . At risk for adverse drug event 04/07/2017  . Acute blood loss as cause of postoperative anemia 04/04/2017  . Acute delirium 04/04/2017  . Osteoporosis 04/02/2017  . Closed fracture of neck of left femur ([place]) 03/31/2017  . Scalp contusion 03/31/2017  . Fall at home, initial encounter 03/31/2017  . Hip fracture ([place]) 03/31/2017  . 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 Hx of transient ischemic attack (TIA) 02/19/2017  . Left carotid stenosis- 40-59% 02/19/2017  . Malnutrition of moderate degree 02/18/2017  . Falls 02/17/2017  . Chest pain 09/26/2016  . Diverticulitis of sigmoid colon 04/25/2016  . Infection due to ESBL-producing Escherichia coli/Diverticular Abscess 03/15/2016  . Diverticulitis 03/12/2016  . Chronic pain syndrome 03/12/2016  . Lesion of pancreas 03/12/2016  . History of chest pain 03/12/2016  . Hypokalemia 03/12/2016  . Angina, class IV ([place]) - chest tightness and pressure with dyspnea with minimal exertion 02/17/2016    Class: Question of  . TIA (transient ischemic attack) 12/26/2015  . DOE (dyspnea on exertion) 12/26/2015  . Right sided weakness 12/17/2015  . Ataxia 12/17/2015  . Shoulder fracture 02/15/2015  . Chronic headache 10/11/2013  . Weakness generalized 08/12/2013  . Dizziness and giddiness 08/12/2013  . Abnormality of gait 07/11/2013  .  Hyperlipidemia 05/07/2007  . Migraine without aura 05/07/2007  . PREMATURE VENTRICULAR CONTRACTIONS, FREQUENT 05/07/2007  . GERD 05/07/2007  . DIVERTICULOSIS, COLON 05/07/2007  . 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 OSTEOPENIA 05/07/2007  . Anxiety and depression 03/24/2007  . HEMORRHOIDS 03/24/2007  . ALLERGIC RHINITIS 03/24/2007  . LOW BACK PAIN 03/24/2007  . MIGRAINES, HX OF 03/24/2007    Past Surgical History:  Procedure Laterality Date  . ANTERIOR APPROACH HEMI HIP ARTHROPLASTY Left 04/01/2017   Procedure: LEFT DIRECT ANTERIOR TOTAL HIP REPLACEMENT;  Surgeon: Leandrew Koyanagi, MD;   Location: [facility];  Service: Orthopedics;  Laterality: Left;  LEFT DIRECT ANTERIOR TOTAL HIP REPLACEMENT  . AUGMENTATION MAMMAPLASTY  1980  . BASAL CELL CARCINOMA EXCISION     "tip of my nose"  . BUNIONECTOMY Bilateral 10/2003  . CARDIAC CATHETERIZATION N/A 02/20/2016   Procedure: Left Heart Cath and Coronary Angiography;  Surgeon: Jettie Booze, MD;  Location: [facility] CV [facility];  Service: Cardiovascular;  Laterality: N/A;  . COLONOSCOPY    . DILATION AND CURETTAGE OF UTERUS    . FRACTURE SURGERY    . IR GENERIC HISTORICAL  03/18/2016   IR SINUS/FIST TUBE CHK-NON GI 03/18/2016 Aletta Edouard, MD [facility]-INTERV RAD  . LAPAROSCOPIC SIGMOID COLECTOMY N/A 04/25/2016   Procedure: LAPAROSCOPIC SIGMOID COLECTOMY;  Surgeon: Stark Klein, MD;  Location: [facility];  Service: General;  Laterality: N/A;  . OOPHORECTOMY Bilateral ~ 1999  . ORIF HUMERUS FRACTURE Right 02/15/2015   Procedure: OPEN REDUCTION INTERNAL FIXATION (ORIF) PROXIMAL HUMERUS FRACTURE;  Surgeon: Netta Cedars, MD;  Location: [facility];  Service: Orthopedics;  Laterality: Right;  . RHINOPLASTY  1976  . TONSILLECTOMY    . TUBAL LIGATION  ~ 1983  . VAGINAL HYSTERECTOMY  ~ 1998     OB History   None      Home Medications    Prior to Admission medications   Medication Sig Start Date End Date Taking? Authorizing Provider  clonazePAM (KLONOPIN) 1 MG tablet Take 1 mg by mouth 2 (two) times daily as needed for anxiety.     [provider]  FLUoxetine (PROZAC) 20 MG tablet Take 3 tablets (60 mg total) by mouth every morning. Take 3 tablets to = 60 mg qam Patient taking differently: Take 80 mg by mouth every morning. Take 3 tablets to = 60 mg qam  04/22/17   Medina-Vargas, Monina C, NP  gabapentin (NEURONTIN) 100 MG capsule Take 100 mg by mouth at bedtime. 01/23/18   [provider]  ibuprofen (ADVIL,MOTRIN) 600 MG tablet Take 1 tablet (600 mg total) by mouth every 6 (six) hours as needed. Patient not taking: Reported on  01/15/2018 05/10/17   Shary Decamp, PA-C  LUTEIN PO Take 1 capsule by mouth daily.     [provider]  Magnesium 500 MG TABS Take 500 mg by mouth at bedtime.    [provider]  meloxicam (MOBIC) 7.5 MG tablet Take 2 tablets (15 mg total) by mouth daily. 02/13/18   Noemi Chapel, MD  methocarbamol (ROBAXIN) 500 MG tablet Take 1 tablet (500 mg total) by mouth 2 (two) times daily. 01/15/18   Lacretia Leigh, MD  mometasone (NASONEX) 50 MCG/ACT nasal spray Place 2 sprays into the nose daily. Patient not taking: Reported on 01/15/2018 04/22/17   Medina-Vargas, Monina C, NP  omeprazole (PRILOSEC) 40 MG capsule Take 40 mg by mouth daily. 06/22/17   [provider]  ondansetron (ZOFRAN) 8 MG tablet Take 1 tablet (8 mg total)  by mouth every 8 (eight) hours as needed for nausea or vomiting. Patient not taking: Reported on 01/15/2018 04/22/17   Medina-Vargas, Monina C, NP  oxyCODONE-acetaminophen (PERCOCET) 10-325 MG tablet Take 1 tablet by mouth every 6 (six) hours as needed for pain.  12/31/17   [provider]  SUMAtriptan (IMITREX) 100 MG tablet TAKE 1/2 TO 1 TABLET BY MOUTH TWICE DAILY AS NEEDED FOR MIGRAINE 12/29/17   [provider]  tiZANidine (ZANAFLEX) 4 MG tablet Take 4 mg by mouth 3 (three) times daily as needed for muscle spasms.  12/28/17   [provider]  topiramate (TOPAMAX) 100 MG tablet Take 1 tablet (100 mg total) by mouth at bedtime. 12/28/17   Kathrynn Ducking, MD  trazodone (DESYREL) 300 MG tablet Take 1 tablet (300 mg total) by mouth at bedtime as needed for sleep. Patient taking differently: Take 300 mg by mouth at bedtime as needed for sleep. sleep 04/22/17   Medina-Vargas, Monina C, NP  vitamin B-12 (CYANOCOBALAMIN) 1000 MCG tablet Take 1 tablet (1,000 mcg total) by mouth daily. 02/19/17   Debbe Odea, MD    Family History Family History  Problem Relation Age of Onset  . Lung cancer Mother 40  . Migraines Mother   . Heart attack Father          Vague  . Hypertension Brother   . Esophageal cancer Brother   . Diabetes Paternal Grandmother        questionable  . Colon cancer Neg Hx   . Kidney disease Neg Hx   . Liver disease Neg Hx     Social History Social History   Tobacco Use  . Smoking status: Current Some Day Smoker    Packs/day: 1.00    Years: 34.00    Pack years: 34.00    Types: Cigarettes    Last attempt to quit: 02/03/2017    Years since quitting: 1.0  . Smokeless tobacco: Never Used  . Tobacco comment: 10 per week or less  Substance Use Topics  . Alcohol use: Yes    Alcohol/week: 0.6 oz    Types: 1 Standard drinks or equivalent per week    Comment: occ   . Drug use: No    Types: Cocaine    Comment: Denies 11-17-17     Allergies   Opana [oxymorphone hcl] and Penicillins   Review of Systems Review of Systems  Cardiovascular: Positive for chest pain ( rib pain on the left).  Gastrointestinal: Negative for nausea and vomiting.  Musculoskeletal: Positive for arthralgias ( L wrist). Negative for back pain and neck pain.  Skin: Negative for rash and wound.  Neurological: Negative for weakness, light-headedness and numbness.  Hematological: Does not bruise/bleed easily.     Physical Exam Updated Vital Signs BP 137/80   Pulse 71   Temp 98.5 F (36.9 C) (Oral)   Resp 18   Ht 5\' 5"  (1.651 m)   Wt 50.8 kg (112 lb)   SpO2 98%   BMI 18.64 kg/m   Physical Exam  Constitutional: She appears well-developed and well-nourished.  HENT:  Head: Normocephalic.  L zygomatic ttp - no obvious deformity - no redness to the skin  Eyes: Conjunctivae are normal. Right eye exhibits no discharge. Left eye exhibits no discharge.  Pulmonary/Chest: Effort normal. No respiratory distress. She exhibits no tenderness ( There is no tenderness over the left chest, she is able to take deep breath without splinting).  Musculoskeletal:  The patient has tenderness with range  of motion of the left wrist, she does have  some tenderness in the snuffbox as well as over the posterior wrist and over the ulnar styloid.  She has full range of motion of the wrist without any swelling or bruising.  She is able to supinate and pronate the left forearm, she has normal range of motion of the elbow and the wrist and the shoulder on the left side.  She has normal-appearing fingers with normal range of motion at all major joints.  Neurological: She is alert. Coordination normal.  The patient is awake and alert and ambulatory, she has normal strength in all 4 extremities, normal coordination, no facial droop  Skin: Skin is warm and dry. No rash noted. She is not diaphoretic. No erythema.  Psychiatric: She has a normal mood and affect.  Nursing note and vitals reviewed.    ED Treatments / Results  Labs (all labs ordered are listed, but only abnormal results are displayed) Labs Reviewed - No data to display  EKG None  Radiology Dg Wrist Complete Left  Result Date: 02/13/2018 CLINICAL DATA:  Initial evaluation for recent trauma, fall 1 month ago. EXAM: LEFT WRIST - COMPLETE 3+ VIEW COMPARISON:  None. FINDINGS: No acute fracture dislocation. Normal distal radioulnar and radiocarpal articulations maintained. Osteopenia noted. No acute soft tissue abnormality. IMPRESSION: No acute osseous abnormality about the left wrist. Electronically Signed   By: Jeannine Boga M.D.   On: 02/13/2018 22:31   Ct Maxillofacial Wo Contrast  Result Date: 02/13/2018 CLINICAL DATA:  67 year old female with fall and facial trauma. EXAM: CT MAXILLOFACIAL WITHOUT CONTRAST TECHNIQUE: Multidetector CT imaging of the maxillofacial structures was performed. Multiplanar CT image reconstructions were also generated. COMPARISON:  Head CT dated 03/31/2017 FINDINGS: Osseous: There is no acute fracture. Minimal step-off of the right nasal bone similar to prior CT likely related to an old injury. No mandibular dislocation. Orbits: Negative. No traumatic or  inflammatory finding. Sinuses: Clear. Soft tissues: Negative. Limited intracranial: No significant or unexpected finding. IMPRESSION: No acute fracture. Electronically Signed   By: Anner Crete M.D.   On: 02/13/2018 22:39    Procedures Procedures (including critical care time)  Medications Ordered in ED Medications  ibuprofen (ADVIL,MOTRIN) tablet 800 mg (800 mg Oral Given 02/13/18 2251)     Initial Impression / Assessment and Plan / ED Course  I have reviewed the triage vital signs and the nursing notes.  Pertinent labs & imaging results that were available during my care of the patient were reviewed by me and considered in my medical decision making (see chart for details).     Well-appearing, there is some traumatic appearing injuries with some tenderness over the left zygoma in the left wrist, at this point I would expect to see fractures or at least healing fractures of those areas.  Imaging ordered.  Anti-inflammatories ordered.  X-rays are unremarkable of both the wrist and the maxillofacial bones of the face.  This makes a subtle nondisplaced fracture of the scaphoid or the wrist at all much less likely given that it is been a month and we would expect to see some new bony deposition.  Patient will be placed in a splint for supportive treatment, anti-inflammatories and discharged home.  Informed of her results  Final Clinical Impressions(s) / ED Diagnoses   Final diagnoses:  Contusion of face, initial encounter  Sprain of left wrist, initial encounter    ED Discharge Orders        Ordered  meloxicam (MOBIC) 7.5 MG tablet  Daily     02/13/18 2345       Noemi Chapel, MD 02/13/18 [phone]

## 2018-02-13 NOTE — ED Triage Notes (Signed)
Pt says that she fell about a month ago, has been evaluated for the same in the ED. Pt says that since falling she has had some pain in her left face with swelling. Also still having left wrist pain. Denies having any falls since that particular fall or any pain meds for the same.

## 2018-02-14 NOTE — ED Notes (Signed)
Pt left before the ordered splint wrist and discharge reassessment.

## 2018-02-19 ENCOUNTER — Inpatient Hospital Stay (HOSPITAL_COMMUNITY)
Admission: AD | Admit: 2018-02-19 | Discharge: 2018-02-19 | Disposition: A | Discharge status: Home / Self Care | Payer: Medicare Other | Source: Ambulatory Visit | Attending: Obstetrics and Gynecology | Admitting: Obstetrics and Gynecology

## 2018-02-19 ENCOUNTER — Encounter (HOSPITAL_COMMUNITY): Payer: Self-pay | Admitting: *Deleted

## 2018-02-19 ENCOUNTER — Inpatient Hospital Stay (HOSPITAL_COMMUNITY): Payer: Medicare Other

## 2018-02-19 DIAGNOSIS — F329 Major depressive disorder, single episode, unspecified: Secondary | ICD-10-CM | POA: Insufficient documentation

## 2018-02-19 DIAGNOSIS — Z85828 Personal history of other malignant neoplasm of skin: Secondary | ICD-10-CM | POA: Insufficient documentation

## 2018-02-19 DIAGNOSIS — G8929 Other chronic pain: Secondary | ICD-10-CM | POA: Insufficient documentation

## 2018-02-19 DIAGNOSIS — K219 Gastro-esophageal reflux disease without esophagitis: Secondary | ICD-10-CM | POA: Insufficient documentation

## 2018-02-19 DIAGNOSIS — E785 Hyperlipidemia, unspecified: Secondary | ICD-10-CM | POA: Diagnosis not present

## 2018-02-19 DIAGNOSIS — F17210 Nicotine dependence, cigarettes, uncomplicated: Secondary | ICD-10-CM | POA: Diagnosis not present

## 2018-02-19 DIAGNOSIS — R296 Repeated falls: Secondary | ICD-10-CM | POA: Diagnosis present

## 2018-02-19 DIAGNOSIS — Z8673 Personal history of transient ischemic attack (TIA), and cerebral infarction without residual deficits: Secondary | ICD-10-CM | POA: Diagnosis not present

## 2018-02-19 DIAGNOSIS — Z96642 Presence of left artificial hip joint: Secondary | ICD-10-CM

## 2018-02-19 DIAGNOSIS — G43909 Migraine, unspecified, not intractable, without status migrainosus: Secondary | ICD-10-CM | POA: Diagnosis not present

## 2018-02-19 DIAGNOSIS — F419 Anxiety disorder, unspecified: Secondary | ICD-10-CM | POA: Diagnosis not present

## 2018-02-19 DIAGNOSIS — Z79899 Other long term (current) drug therapy: Secondary | ICD-10-CM | POA: Diagnosis not present

## 2018-02-19 DIAGNOSIS — I2510 Atherosclerotic heart disease of native coronary artery without angina pectoris: Secondary | ICD-10-CM | POA: Diagnosis not present

## 2018-02-19 DIAGNOSIS — W19XXXS Unspecified fall, sequela: Secondary | ICD-10-CM

## 2018-02-19 MED ORDER — OXYCODONE-ACETAMINOPHEN 5-325 MG PO TABS: 2.0000 | ORAL_TABLET | Freq: Four times a day (QID) | ORAL | 0 refills | Status: AC | PRN

## 2018-02-19 NOTE — Discharge Instructions (Signed)

## 2018-02-19 NOTE — Progress Notes (Signed)
Pt did not want to use w/c for d/c

## 2018-02-19 NOTE — Progress Notes (Signed)
Darrol Poke CNM in earlier to discuss test results and d/c plan with pt. Written and verbal d/c instructions given and understanding voiced

## 2018-02-19 NOTE — MAU Provider Note (Signed)
Chief Complaint: Fall   First Provider Initiated Contact with Patient 02/19/18 2258     SUBJECTIVE HPI: Dorothy White is a 67 y.o. G0P0000 not currently pregnant who presents to maternity admissions reporting fall. She reports she fell into her brick fireplace around 1900 tonight. She reports she tripped and fell. She reports pain is 6/10- has not taken any medication for pain. She reports pain is worse when she stands or sits. Drove herself to the hospital. She has a left hip replacement that was done in Sept of 2018. She has a hx of recurrent falls.   Past Medical History:  Diagnosis Date  . Anemia    ?  Marland Kitchen Anxiety   . Arthritis    "everywhere" (04/25/2016)  . 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 Chronic back pain    "all over" (04/25/2016)  . Constipation   . Depression   . Diverticulitis   . GERD (gastroesophageal reflux disease)   . Headache    "weekly" (04/25/2016)  . 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 HLD (hyperlipidemia)    hx (04/25/2016)  . 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 Mild CAD    a. 25% mLAD, otherwise no sig disease 01/2016 cath.  Marland Kitchen NICM (nonischemic cardiomyopathy) ([place])    a. EF 40-45% by echo 02/5630 at time of complicated migraine/neuro sx, 55-65% at time of cath 01/2016  . Stroke [facility]) 06/2013   "mini" stroke , ?possibly hemaplegic migraine per pt   Past Surgical History:  Procedure Laterality Date  . ANTERIOR APPROACH HEMI HIP ARTHROPLASTY Left 04/01/2017   Procedure: LEFT DIRECT ANTERIOR TOTAL HIP REPLACEMENT;  Surgeon: Leandrew Koyanagi, MD;  Location: [place];  Service: Orthopedics;  Laterality: Left;  LEFT DIRECT ANTERIOR TOTAL HIP REPLACEMENT  . AUGMENTATION MAMMAPLASTY  1980  . BASAL CELL CARCINOMA EXCISION     "tip of my nose"  . BUNIONECTOMY Bilateral 10/2003  . CARDIAC CATHETERIZATION N/A 02/20/2016   Procedure: Left Heart Cath and Coronary Angiography;  Surgeon: Jettie Booze, MD;   Location: [facility] [facility];  Service: Cardiovascular;  Laterality: N/A;  . COLONOSCOPY    . DILATION AND CURETTAGE OF UTERUS    . FRACTURE SURGERY    . IR GENERIC HISTORICAL  03/18/2016   IR SINUS/FIST TUBE CHK-NON GI 03/18/2016 Aletta Edouard, MD [facility]-INTERV RAD  . LAPAROSCOPIC SIGMOID COLECTOMY N/A 04/25/2016   Procedure: LAPAROSCOPIC SIGMOID COLECTOMY;  Surgeon: Stark Klein, MD;  Location: [place];  Service: General;  Laterality: N/A;  . OOPHORECTOMY Bilateral ~ 1999  . ORIF HUMERUS FRACTURE Right 02/15/2015   Procedure: OPEN REDUCTION INTERNAL FIXATION (ORIF) PROXIMAL HUMERUS FRACTURE;  Surgeon: Netta Cedars, MD;  Location: [facility];  Service: Orthopedics;  Laterality: Right;  . RHINOPLASTY  1976  . TONSILLECTOMY    . TUBAL LIGATION  ~ 1983  . VAGINAL HYSTERECTOMY  ~ 1998   Social History   Socioeconomic History  . Marital status: Divorced    Spouse name: Not on file  . Number of children: 0  . Years of education: 44  . Highest education level: Not on file  Occupational History  . Occupation: retired    Fish farm manager: [employer]    Comment: Retired    Fish farm manager: [employer] [place]  . Financial resource strain: Not on file  . Food insecurity:    Worry: Not on file    Inability: Not on file  .  Transportation needs:    Medical: Not on file    Non-medical: Not on file  Tobacco Use  . Smoking status: Current Some Day Smoker    Packs/day: 1.00    Years: 34.00    Pack years: 34.00    Types: Cigarettes    Last attempt to quit: 02/03/2017    Years since quitting: 1.0  . Smokeless tobacco: Never Used  . Tobacco comment: 10 per week or less  Substance and Sexual Activity  . Alcohol use: Yes    Alcohol/week: 0.6 oz    Types: 1 Standard drinks or equivalent per week    Comment: occ   . Drug use: No    Types: Cocaine    Comment: Denies any drug use 02/19/18  . Sexual activity: Never  Lifestyle  . Physical activity:    Days per week: Not on file    Minutes per  session: Not on file  . Stress: Not on file  Relationships  . Social connections:    Talks on phone: Not on file    Gets together: Not on file    Attends religious service: Not on file    Active member of club or organization: Not on file    Attends meetings of clubs or organizations: Not on file    Relationship status: Not on file  . Intimate partner violence:    Fear of current or ex partner: Not on file    Emotionally abused: Not on file    Physically abused: Not on file    Forced sexual activity: Not on file  Other Topics Concern  . Not on file  Social History Narrative   Patient lives at home alone.    Patient is retired.   Education- High school and some college   Right handed.   Patient drinks one cup of coffee and a big glass of ice tea.   No current facility-administered medications on file prior to encounter.    Current Outpatient Medications on File Prior to Encounter  Medication Sig Dispense Refill  . clonazePAM (KLONOPIN) 1 MG tablet Take 1 mg by mouth 2 (two) times daily as needed for anxiety.     Marland Kitchen FLUoxetine (PROZAC) 20 MG tablet Take 3 tablets (60 mg total) by mouth every morning. Take 3 tablets to = 60 mg qam (Patient taking differently: Take 80 mg by mouth every morning. Take 3 tablets to = 60 mg qam ) 90 tablet 0  . gabapentin (NEURONTIN) 100 MG capsule Take 100 mg by mouth as needed.   3  . LUTEIN PO Take 1 capsule by mouth daily.     . Magnesium 500 MG TABS Take 500 mg by mouth at bedtime.    Marland Kitchen omeprazole (PRILOSEC) 40 MG capsule Take 40 mg by mouth daily.  11  . oxyCODONE-acetaminophen (PERCOCET) 10-325 MG tablet Take 1 tablet by mouth every 6 (six) hours as needed for pain.   0  . SUMAtriptan (IMITREX) 100 MG tablet TAKE 1/2 TO 1 TABLET BY MOUTH TWICE DAILY AS NEEDED FOR MIGRAINE  3  . tiZANidine (ZANAFLEX) 4 MG tablet Take 4 mg by mouth 3 (three) times daily as needed for muscle spasms.   0  . topiramate (TOPAMAX) 100 MG tablet Take 1 tablet (100 mg  total) by mouth at bedtime. 90 tablet 3  . trazodone (DESYREL) 300 MG tablet Take 1 tablet (300 mg total) by mouth at bedtime as needed for sleep. (Patient taking differently: Take 300 mg by  mouth at bedtime as needed for sleep. sleep) 30 tablet 0  . vitamin B-12 (CYANOCOBALAMIN) 1000 MCG tablet Take 1 tablet (1,000 mcg total) by mouth daily. 30 tablet 0  . ibuprofen (ADVIL,MOTRIN) 600 MG tablet Take 1 tablet (600 mg total) by mouth every 6 (six) hours as needed. (Patient not taking: Reported on 01/15/2018) 30 tablet 0  . meloxicam (MOBIC) 7.5 MG tablet Take 2 tablets (15 mg total) by mouth daily. 30 tablet 0  . methocarbamol (ROBAXIN) 500 MG tablet Take 1 tablet (500 mg total) by mouth 2 (two) times daily. 20 tablet 0  . mometasone (NASONEX) 50 MCG/ACT nasal spray Place 2 sprays into the nose daily. (Patient not taking: Reported on 01/15/2018) 17 g 0  . ondansetron (ZOFRAN) 8 MG tablet Take 1 tablet (8 mg total) by mouth every 8 (eight) hours as needed for nausea or vomiting. (Patient not taking: Reported on 01/15/2018) 30 tablet 0   Allergies  Allergen Reactions  . Opana [Oxymorphone Hcl] Other (See Comments)    hallucinations  . Penicillins Hives    Has patient had a PCN reaction causing immediate rash, facial/tongue/throat swelling, SOB or lightheadedness with hypotension: Unknown Has patient had a PCN reaction causing severe rash involving mucus membranes or skin necrosis: No Has patient had a PCN reaction that required hospitalization No Has patient had a PCN reaction occurring within the last 10 years: No If all of the above answers are "NO", then may proceed with Cephalosporin use.     ROS:  Review of Systems  Respiratory: Negative.   Cardiovascular: Negative.   Gastrointestinal: Negative.   Genitourinary: Negative.   Musculoskeletal:       Left hip pain and left lateral aspect of leg   I have reviewed patient's Past Medical Hx, Surgical Hx, Family Hx, Social Hx, medications and  allergies.   Physical Exam   Patient Vitals for the past 24 hrs:  BP Temp Pulse Resp SpO2 Height Weight  02/19/18 2305 130/66 - 88 18 - - -  02/19/18 2131 - - - - 99 % - -  02/19/18 2128 (!) 148/82 98 F (36.7 C) 83 18 - 5\' 5"  (1.651 m) 114 lb (51.7 kg)   Constitutional: Well-developed, well-nourished female in no acute distress.  Cardiovascular: normal rate Respiratory: normal effort GI: Abd soft, non-tender. Pos BS x 4 MS: Left hip and left lower extremity tender, 1 abrasion identified- hemostatic, bruising noted, mild edema around bruising, normal ROM Neurologic: Alert and oriented x 4.  PELVIC EXAM:deferred  --/--/O POS (09/08 0930)  IMAGING  Dg Hip Unilat W Or Wo Pelvis 2-3 Views Left  Result Date: 02/19/2018 CLINICAL DATA:  Fall EXAM: DG HIP (WITH OR WITHOUT PELVIS) 2-3V LEFT COMPARISON:  11/16/2017 FINDINGS: Postsurgical changes in the pelvis. Mild SI joint degenerative change. Status post left hip replacement with intact hardware and no fracture or dislocation. IMPRESSION: Status post left hip replacement.  No acute osseous abnormality. Electronically Signed   By: Donavan Foil M.D.   On: 02/19/2018 22:28    MAU Management/MDM: Orders Placed This Encounter  Procedures  . DG Hip Unilat W or Wo Pelvis 2-3 Views Left   [place] reviewed- no abnormalities, no signs of fracture or dislocation   Meds ordered this encounter  Medications  . oxyCODONE-acetaminophen (PERCOCET/ROXICET) 5-325 MG tablet    Sig: Take 2 tablets by mouth every 6 (six) hours as needed for up to 3 days for severe pain.    Dispense:  15 tablet  Refill:  0    Order Specific Question:   Supervising Provider    Answer:   Jonnie Kind [[id number]]   Unable to give pain medication in MAU- patient drove herself  Pt discharged with instructions if symptoms worsen to return to [facility] ED. Patient verbalizes understanding  Rx for short term Percocet given   ASSESSMENT 1. Recurrent falls   2. History of left hip  replacement   3. Fall, sequela     PLAN Discharge home. Pt stable at time of discharge  Return to [facility] with worsening symptoms  Ice hip and leg occasionally  Tylenol for pain and Rx for Percocet for severe pain   Follow-up Information    [facility] Follow up.   Why:  Follow up with [facility] for worsening symptoms  Contact information: [place] [address] [phone]          Allergies as of 02/19/2018      Reactions   Opana [oxymorphone Hcl] Other (See Comments)   hallucinations   Penicillins Hives   Has patient had a PCN reaction causing immediate rash, facial/tongue/throat swelling, SOB or lightheadedness with hypotension: Unknown Has patient had a PCN reaction causing severe rash involving mucus membranes or skin necrosis: No Has patient had a PCN reaction that required hospitalization No Has patient had a PCN reaction occurring within the last 10 years: No If all of the above answers are "NO", then may proceed with Cephalosporin use.      Medication List    STOP taking these medications   oxyCODONE-acetaminophen 10-325 MG tablet Commonly known as:  PERCOCET Replaced by:  oxyCODONE-acetaminophen 5-325 MG tablet     TAKE these medications   clonazePAM 1 MG tablet Commonly known as:  KLONOPIN Take 1 mg by mouth 2 (two) times daily as needed for anxiety.   FLUoxetine 20 MG tablet Commonly known as:  PROZAC Take 3 tablets (60 mg total) by mouth every morning. Take 3 tablets to = 60 mg qam What changed:    how much to take  additional instructions   gabapentin 100 MG capsule Commonly known as:  NEURONTIN Take 100 mg by mouth as needed.   ibuprofen 600 MG tablet Commonly known as:  ADVIL,MOTRIN Take 1 tablet (600 mg total) by mouth every 6 (six) hours as needed.   LUTEIN PO Take 1 capsule by mouth daily.   Magnesium 500 MG Tabs Take 500 mg by mouth at bedtime.   meloxicam 7.5 MG  tablet Commonly known as:  MOBIC Take 2 tablets (15 mg total) by mouth daily.   methocarbamol 500 MG tablet Commonly known as:  ROBAXIN Take 1 tablet (500 mg total) by mouth 2 (two) times daily.   mometasone 50 MCG/ACT nasal spray Commonly known as:  NASONEX Place 2 sprays into the nose daily.   omeprazole 40 MG capsule Commonly known as:  PRILOSEC Take 40 mg by mouth daily.   ondansetron 8 MG tablet Commonly known as:  ZOFRAN Take 1 tablet (8 mg total) by mouth every 8 (eight) hours as needed for nausea or vomiting.   oxyCODONE-acetaminophen 5-325 MG tablet Commonly known as:  PERCOCET/ROXICET Take 2 tablets by mouth every 6 (six) hours as needed for up to 3 days for severe pain. Replaces:  oxyCODONE-acetaminophen 10-325 MG tablet   SUMAtriptan 100 MG tablet Commonly known as:  IMITREX TAKE 1/2 TO 1 TABLET BY MOUTH TWICE DAILY AS NEEDED FOR MIGRAINE  tiZANidine 4 MG tablet Commonly known as:  ZANAFLEX Take 4 mg by mouth 3 (three) times daily as needed for muscle spasms.   topiramate 100 MG tablet Commonly known as:  TOPAMAX Take 1 tablet (100 mg total) by mouth at bedtime.   trazodone 300 MG tablet Commonly known as:  DESYREL Take 1 tablet (300 mg total) by mouth at bedtime as needed for sleep. What changed:  additional instructions   vitamin B-12 1000 MCG tablet Commonly known as:  CYANOCOBALAMIN Take 1 tablet (1,000 mcg total) by mouth daily.       Darrol Poke  Certified Nurse-Midwife 02/19/2018  11:00 PM

## 2018-02-19 NOTE — MAU Note (Addendum)
Tripped tonight and fell on left hip across brick fireplace. Pt is limping in Triage but ambulates without help. Drove herself to hospital. Pain is worse with standing. Pt states has had L hip replaced in past. Later was walking up steps and fell on L hip again.

## 2018-02-19 NOTE — MAU Note (Signed)
Now states she has headache and L shoulder where she tried to catch herself during the fall. Hip pain is worse when standing.

## 2018-02-20 IMAGING — CR DG CHEST 2V
2 series · 2 of 2 positions shown · non-contrast
Comparison: December 18, 2015

CLINICAL DATA: Chest pain and shortness of breath

EXAM:
CHEST  2 VIEW

[chest pa]
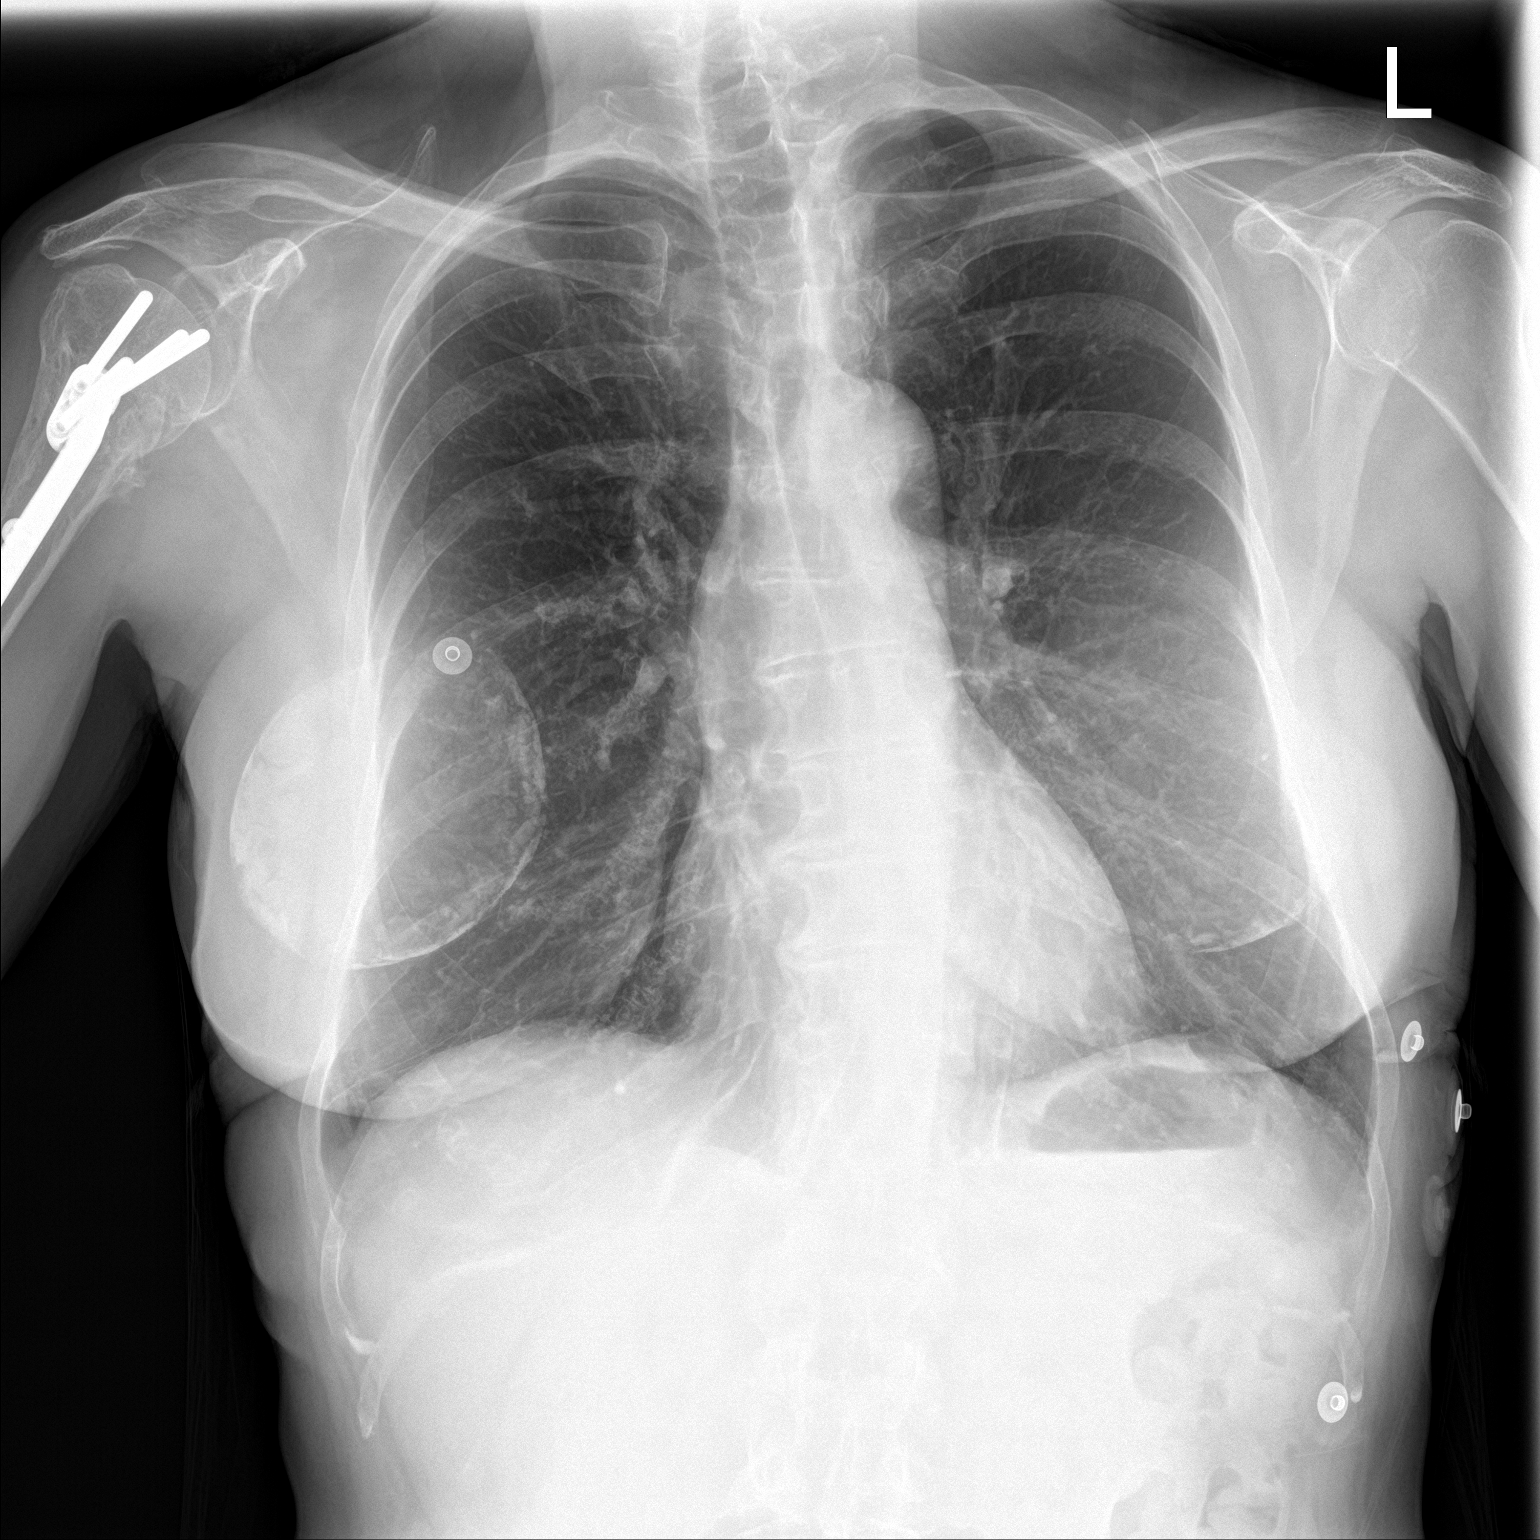

[chest lat]
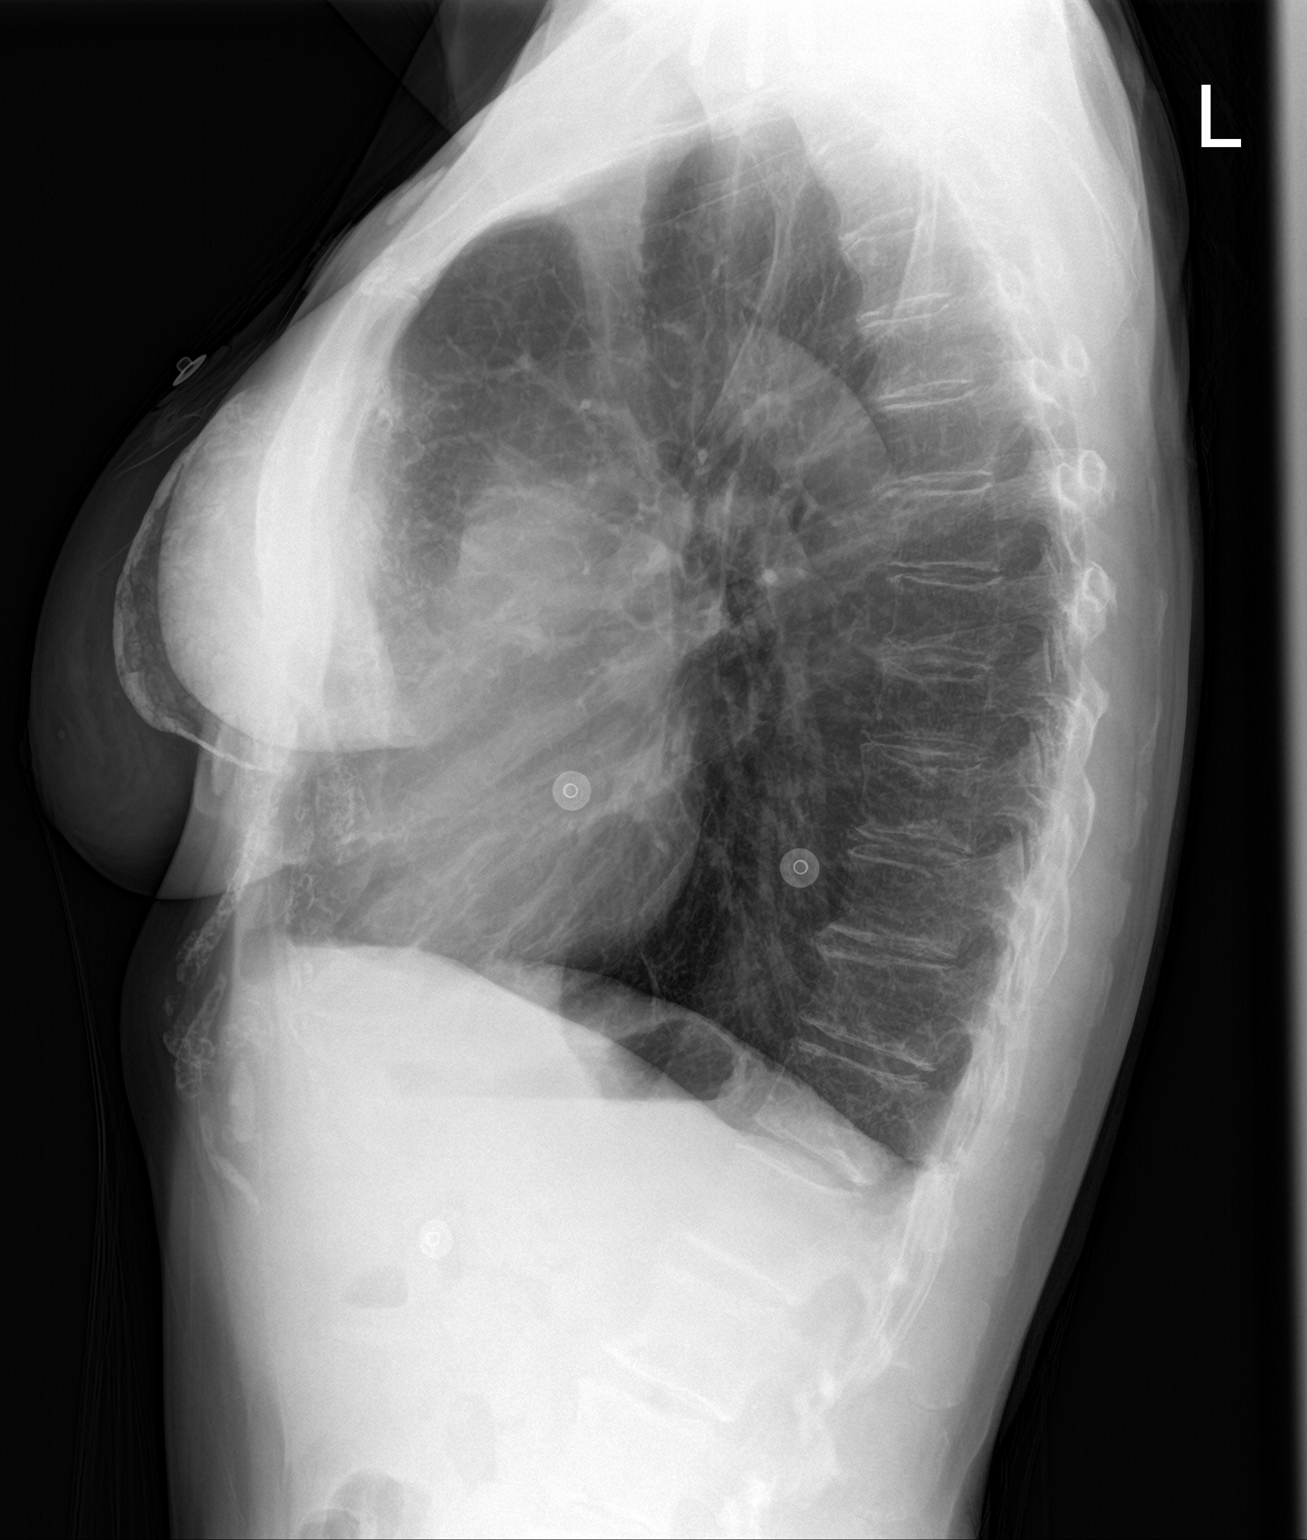

[2 of 2 positions shown; findings below may reference images not displayed]

FINDINGS: Bilateral breast implants with capsular calcifications. The heart,
hila, mediastinum, lungs, and pleura are unremarkable with no acute
abnormalities.
IMPRESSION: No active cardiopulmonary disease.

## 2018-02-25 ENCOUNTER — Encounter: Payer: Self-pay | Admitting: Physical Therapy

## 2018-02-25 ENCOUNTER — Ambulatory Visit: Payer: Medicare Other | Attending: Neurology | Admitting: Physical Therapy

## 2018-02-25 DIAGNOSIS — R2681 Unsteadiness on feet: Secondary | ICD-10-CM | POA: Insufficient documentation

## 2018-02-25 DIAGNOSIS — M6281 Muscle weakness (generalized): Secondary | ICD-10-CM | POA: Insufficient documentation

## 2018-02-25 DIAGNOSIS — R2689 Other abnormalities of gait and mobility: Secondary | ICD-10-CM | POA: Diagnosis not present

## 2018-02-26 NOTE — Therapy (Signed)
[facility] [address] [place], [place], [address] Phone: [phone]   Fax:  [phone]  Physical Therapy Evaluation  Patient Details  Name: Dorothy White MRN: [id number] Date of Birth: [birth year] Referring Provider: Margette Fast   Encounter Date: 02/25/2018  PT End of Session - 02/25/18 1621    Visit Number  1    Number of Visits  17    Date for PT Re-Evaluation  04/26/18    Authorization Type  [employer] Medicare/Medicaid    PT Start Time  0940 pt arrived late, able to be seen longer due to cancel    PT Stop Time  1030    PT Time Calculation (min)  50 min    Activity Tolerance  Patient tolerated treatment well    Behavior During Therapy  [facility] for tasks assessed/performed       Past Medical History:  Diagnosis Date  . Anemia    ?  Marland Kitchen Anxiety   . Arthritis    "everywhere" (04/25/2016)  . 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 Chronic back pain    "all over" (04/25/2016)  . Constipation   . Depression   . Diverticulitis   . GERD (gastroesophageal reflux disease)   . Headache    "weekly" (04/25/2016)  . 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 HLD (hyperlipidemia)    hx (04/25/2016)  . 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 Mild CAD    a. 25% mLAD, otherwise no sig disease 01/2016 cath.  Marland Kitchen NICM (nonischemic cardiomyopathy) ([place])    a. EF 40-45% by echo 0/9735 at time of complicated migraine/neuro sx, 55-65% at time of cath 01/2016  . Stroke [facility]) 06/2013   "mini" stroke , ?possibly hemaplegic migraine per pt    Past Surgical History:  Procedure Laterality Date  . ANTERIOR APPROACH HEMI HIP ARTHROPLASTY Left 04/01/2017   Procedure: LEFT DIRECT ANTERIOR TOTAL HIP REPLACEMENT;  Surgeon: Leandrew Koyanagi, MD;  Location: [facility];  Service: Orthopedics;  Laterality: Left;  LEFT DIRECT ANTERIOR TOTAL HIP REPLACEMENT  . AUGMENTATION MAMMAPLASTY  1980  . BASAL CELL  CARCINOMA EXCISION     "tip of my nose"  . BUNIONECTOMY Bilateral 10/2003  . CARDIAC CATHETERIZATION N/A 02/20/2016   Procedure: Left Heart Cath and Coronary Angiography;  Surgeon: Jettie Booze, MD;  Location: [facility];  Service: Cardiovascular;  Laterality: N/A;  . COLONOSCOPY    . DILATION AND CURETTAGE OF UTERUS    . FRACTURE SURGERY    . IR GENERIC HISTORICAL  03/18/2016   IR SINUS/FIST TUBE CHK-NON GI 03/18/2016 Aletta Edouard, MD [facility]-INTERV RAD  . LAPAROSCOPIC SIGMOID COLECTOMY N/A 04/25/2016   Procedure: LAPAROSCOPIC SIGMOID COLECTOMY;  Surgeon: Stark Klein, MD;  Location: [place];  Service: General;  Laterality: N/A;  . OOPHORECTOMY Bilateral ~ 1999  . ORIF HUMERUS FRACTURE Right 02/15/2015   Procedure: OPEN REDUCTION INTERNAL FIXATION (ORIF) PROXIMAL HUMERUS FRACTURE;  Surgeon: Netta Cedars, MD;  Location: [place];  Service: Orthopedics;  Laterality: Right;  . RHINOPLASTY  1976  . TONSILLECTOMY    . TUBAL LIGATION  ~ 1983  . VAGINAL HYSTERECTOMY  ~ 1998    There were no vitals filed for this visit.   Subjective Assessment - 02/25/18 1045    Subjective  I've had a lot of falls, really started since after my hip replacement, feel that my L leg is longer now than my  R.  Falls often happen when I turn, when I have my hemiplegic migraines.  Have been to the ED several times due to falls.  Uses cane for mobility.  Sometimes needs assistance to get up from the floor after falling.    Pertinent History  L THR 03/2017, hemiplegic migraines    Patient Stated Goals  Pt's goals for therapy are to build strength up and stop falling.  To get my balance back.    Currently in Pain?  Yes    Pain Score  4     Pain Location  Hip    Pain Orientation  Left    Pain Descriptors / Indicators  Aching;Throbbing    Pain Type  Chronic pain    Pain Frequency  Intermittent    Aggravating Factors   Standing, walking, putting pressure on LLE    Pain Relieving Factors  sitting         [facility]  PT Assessment - 02/25/18 1054      Assessment   Medical Diagnosis  gait abnormality, frequent falls    Referring Provider  Margette Fast    Onset Date/Surgical Date  12/17/17 Order date, falls since September 2018      Precautions   Precautions  Fall      Balance Screen   Has the patient fallen in the past 6 months  Yes    How many times?  at least 10    Has the patient had a decrease in activity level because of a fear of falling?   Yes    Is the patient reluctant to leave their home because of a fear of falling?   Yes      [place] residence    Living Arrangements  Alone    Type of [place] in [place] to enter    Entrance Stairs-Number of Steps  2 steps up to landing, 2 steps down     Home Layout  -- Lives in [place] - 2 wheels;Cane - single point      Prior Function   Level of Independence  Independent    Vocation  Retired    Leisure  Enjoyed playing pickleball prior to hip replacement, enjoyed gardening.  Enjoying walking dogs      Observation/Other Assessments   Observations  Assessed leg leg differenc from ASIS>medial malleolus:  RLE 81.5 cm, L 83 cm    Focus on Therapeutic Outcomes (FOTO)   NA      ROM / Strength   AROM / PROM / Strength  Strength      Strength   Overall Strength  Deficits    Strength Assessment Site  Hip;Knee;Ankle    Right/Left Hip  Right;Left    Right Hip Flexion  5/5    Left Hip Flexion  4+/5    Right/Left Knee  Right;Left    Right Knee Flexion  4/5    Right Knee Extension  5/5    Left Knee Flexion  4/5    Left Knee Extension  4+/5    Right/Left Ankle  Right;Left    Right Ankle Dorsiflexion  4/5    Left Ankle Dorsiflexion  3+/5      Transfers   Transfers  Sit to Stand;Stand to Sit    Sit to Stand  6: Modified independent (Device/Increase time);From chair/3-in-1;Without upper extremity assist    Five time  sit to stand comments   17.22     Stand to Sit  6: Modified independent (Device/Increase time);Without upper extremity assist;To chair/3-in-1      Ambulation/Gait   Ambulation/Gait  Yes    Ambulation/Gait Assistance  6: Modified independent (Device/Increase time)    Ambulation Distance (Feet)  200 Feet    Assistive device  Straight cane    Gait Pattern  Step-through pattern;Step-to pattern;Decreased arm swing - left;Decreased step length - right;Narrow base of support    Ambulation Surface  Level;Unlevel    Gait velocity  23.35 sec = 1.4 ft/sec      Standardized Balance Assessment   Standardized Balance Assessment  Timed Up and Go Test;Dynamic Gait Index      Dynamic Gait Index   Level Surface  Moderate Impairment    Change in Gait Speed  Moderate Impairment    Gait with Horizontal Head Turns  Moderate Impairment    Gait with Vertical Head Turns  Moderate Impairment    Gait and Pivot Turn  Moderate Impairment 5.88    Step Over Obstacle  Moderate Impairment    Step Around Obstacles  Moderate Impairment    Steps  Moderate Impairment    Total Score  8    DGI comment:  Scores <19/24 are indicative of increased fall risk.      Timed Up and Go Test   Normal TUG (seconds)  27.03    Manual TUG (seconds)  20.4    Cognitive TUG (seconds)  30.53    TUG Comments  Scores >13.5-15 seconds indicates increased fall risk.      High Level Balance   High Level Balance Comments  Standing EO/EC 30 seconds on solid surface.  Standing EO on foam x 30 seconds, standing EC on foam 18 seconds with increased sway       -Indicating possible decreased vestibular system use for balance.         Objective measurements completed on examination: See above findings.              PT Education - 02/25/18 1620    Education Details  Educated patient in fall risk per objective measures, her cane height is too short and she may benefit from higher cane; PT POC, brief discussion regarding fall prevention    Person(s) Educated   Patient    Methods  Explanation    Comprehension  Verbalized understanding       PT Short Term Goals - 02/26/18 1159      PT SHORT TERM GOAL #1   Title  Pt will be independent with HEP for improved balance and gait.  TARGET 03/26/18    Time  4    Period  Weeks    Status  New    Target Date  03/26/18      PT SHORT TERM GOAL #2   Title  Pt will improve TUG score to less than or equal to 20 seconds for decreased fall risk.    Time  4    Period  Weeks    Status  New    Target Date  03/26/18      PT SHORT TERM GOAL #3   Title  Pt will improve DGI score to at least 13/24 for decreased fall risk.    Time  4    Period  Weeks    Status  New    Target Date  03/26/18      PT SHORT TERM GOAL #4  Title  Pt will improve gait velocity to at least 1.8 ft/sec for decreased fall risk.    Time  4    Period  Weeks    Status  New    Target Date  03/26/18      PT SHORT TERM GOAL #5   Title  Pt will verbalize understanding of fall prevention in home environment.    Time  4    Period  Weeks    Status  New    Target Date  03/26/18        PT Long Term Goals - 02/26/18 1202      PT LONG TERM GOAL #1   Title  Pt will improve 5x sit<>stand to less than or equal to 11.5 sec for improved functional strength and improved transfer efficiency.  TARGET 04/23/18    Time  8    Period  Weeks    Status  New    Target Date  04/23/18      PT LONG TERM GOAL #2   Title  Pt will improve TUG score to less than or equal to 13.5 seconds for decreased fall risk.    Time  8    Period  Weeks    Status  New    Target Date  04/23/18      PT LONG TERM GOAL #3   Title  Pt will improve DGI score to at least 18/24 for decreased fall risk.    Time  8    Period  Weeks    Status  New    Target Date  04/23/18      PT LONG TERM GOAL #4   Title  Pt will improve gait velocity to at least 2.3 ft/sec for improved gait efficiency and safety.    Time  8    Period  Weeks    Status  New    Target Date  04/23/18       PT LONG TERM GOAL #5   Title  Pt will verbalize plans for continued community fitness upon d/c from PT.    Time  8    Period  Weeks    Status  New    Target Date  04/23/18             Plan - 02/25/18 1624    Clinical Impression Statement  Pt is a 67 year old female who presents to OP PT with dx of gait abnormality and frequent falls.  She has been to ED 5 times since May, due to falls.  She has history of L hip replacement and feels that falls started around that time (September 2018).  She presents with decreased muscle strength, decreased balance, decreased independence and safety with gait, slowed transfers, fear of falling limiting activities, and leg length difference with LLE longer than RLE.  She has had at least 10 falls in past six months (perhaps more, due to 6 reported falls in April).  She is at high risk of falls per TUG, gait velocity and DGI scores.  She would benefit from skilled PT to address the above stated deficits to improve functional mobility and decrease future risk of falls.    History and Personal Factors relevant to plan of care:  Multiple falls in past 6 months, at least 5 ED admissions since May 2019-fall related, PMH >3 co-morbidities    Clinical Presentation  Evolving    Clinical Presentation due to:  fall risk per TUG, DGI, gait velocity, hx  of multiple falls    Clinical Decision Making  Moderate    Rehab Potential  Good    PT Frequency  2x / week    PT Duration  8 weeks plus eval    PT Treatment/Interventions  ADLs/Self Care Home Management;DME Instruction;Balance training;Therapeutic exercise;Therapeutic activities;Functional mobility training;Gait training;Neuromuscular re-education;Patient/family education    PT Next Visit Plan  Consider performing Sensory Organization test?; initiate HEP for balance; trial lift for RLE and try higher cane; fall prevention education    Consulted and Agree with Plan of Care  Patient       Patient will benefit from  skilled therapeutic intervention in order to improve the following deficits and impairments:  Abnormal gait, Decreased balance, Decreased knowledge of precautions, Decreased safety awareness, Decreased mobility, Decreased strength, Difficulty walking  Visit Diagnosis: Other abnormalities of gait and mobility  Unsteadiness on feet  Muscle weakness (generalized)     Problem List Patient Active Problem List   Diagnosis Date Noted  . Recurrent falls 04/16/2017  . 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 At risk for adverse drug event 04/07/2017  . Acute blood loss as cause of postoperative anemia 04/04/2017  . Acute delirium 04/04/2017  . Osteoporosis 04/02/2017  . Closed fracture of neck of left femur ([place]) 03/31/2017  . Scalp contusion 03/31/2017  . Fall at home, initial encounter 03/31/2017  . Hip fracture ([place]) 03/31/2017  . 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 Hx of transient ischemic attack (TIA) 02/19/2017  . Left carotid stenosis- 40-59% 02/19/2017  . Malnutrition of moderate degree 02/18/2017  . Falls 02/17/2017  . Chest pain 09/26/2016  . Diverticulitis of sigmoid colon 04/25/2016  . Infection due to ESBL-producing Escherichia coli/Diverticular Abscess 03/15/2016  . Diverticulitis 03/12/2016  . Chronic pain syndrome 03/12/2016  . Lesion of pancreas 03/12/2016  . History of chest pain 03/12/2016  . Hypokalemia 03/12/2016  . Angina, class IV ([place]) - chest tightness and pressure with dyspnea with minimal exertion 02/17/2016    Class: Question of  . TIA (transient ischemic attack) 12/26/2015  . DOE (dyspnea on exertion) 12/26/2015  . Right sided weakness 12/17/2015  . Ataxia 12/17/2015  . Shoulder fracture 02/15/2015  . Chronic headache 10/11/2013  . Weakness generalized 08/12/2013  . Dizziness and giddiness 08/12/2013  . Abnormality of gait 07/11/2013  . Hyperlipidemia 05/07/2007  . Migraine without aura 05/07/2007  . PREMATURE VENTRICULAR CONTRACTIONS,  FREQUENT 05/07/2007  . GERD 05/07/2007  . DIVERTICULOSIS, COLON 05/07/2007  . 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 OSTEOPENIA 05/07/2007  . Anxiety and depression 03/24/2007  . HEMORRHOIDS 03/24/2007  . ALLERGIC RHINITIS 03/24/2007  . LOW BACK PAIN 03/24/2007  . MIGRAINES, HX OF 03/24/2007    Rudolf Blizard W. 02/26/2018, 12:07 PM Frazier Butt., PT  [facility] [address] [place], [place], [address] Phone: [phone]   Fax:  [phone]  Name: Dorothy White MRN: [id number] Date of Birth: [birth year]

## 2018-03-01 ENCOUNTER — Ambulatory Visit: Payer: Medicare Other | Admitting: Physical Therapy

## 2018-03-02 ENCOUNTER — Ambulatory Visit: Payer: Medicare Other | Admitting: Physical Therapy

## 2018-03-10 ENCOUNTER — Other Ambulatory Visit: Payer: Self-pay

## 2018-03-10 NOTE — Patient Outreach (Signed)
[place] [facility]) Care Management  03/10/2018  Dorothy White 1950/12/03 [id number]   Telephone Screen  Referral Date: 03/10/18 Referral Source: MD office Referral Reason: " SW, imbalance" Insurance: [facility] Medicare   Incoming call from patient returning RN CM call. Screening completed with patient.   Social: Patient resides in her home. She states she has "housemates" but "they do their own thing and does not help out." She is fairly independent with ADLs/IADLs. However, she voices she is needing more assistance lately. Patient states that she would like for someone to come and help prepare meals for her. As well as she is "lonely" and would like someone to sit with her for a few hours a day and help her run errands." She voices that she was driving but has been unable to do so as she needs cataracts surgery and will not be able to drive until after she recovers from surgery. She is interested in alternative transportation resources to assist her. Patient voices "several falls" in the home. She rep rots at least 4 or more within the last three months and over 8 within the past year. She has cane and walker but primarily uses cane. She states that she thinks her headaches are due to a trigger from her migraines. Patient currently was set up for outpatient rehab. However, she voices that she is unable to attend as she has no transportation to get to appts.    Conditions: Per chart review, patient has PMH of HLD, migraines, depression, insomnia, CKD stage 3, anxiety, osteoporosis and CHF. Patient denies having CHF. She stets that sh e does not weigh in the home and her normal weight is 110-112 lbs. Patient noted to have had several recent ED visits- at least 8 visits since April 2019. Patient agrees that she needs further education and support in managing her conditions.   Medications: Per patient, she is taking about 8 meds. She denies any issues affording meds. However, she does  state that she is getting more forgetful with her meds. She fills med planner weekly on her own but admits at least twice a week she "forgets to take them."  Appointments: Patient followed by PCP.   Advance Directives: None. Patient interested in further info.  Consent: [facility] services reviewed and discussed. Patient gave verbal consent for services.  Plan: RN CM will send referral to [facility] RN for further in home eval/assessment of care needs and management of chronic conditions. RN CM will send [facility] SW referral for possible assistance with resources related to transportation and in home support and advance directives assistance. RN CM will send [facility] pharmacy referral for possible med mgmt.  Enzo Montgomery, RN,BSN,CCM [place] Management Telephonic Care Management Coordinator [place] Phone: [phone] Toll Free: [phone] Fax: [phone]

## 2018-03-10 NOTE — Patient Outreach (Addendum)
[place] [facility]) Care Management  03/10/2018  Dorothy White July 28, 1951 [id number]   Telephone Screen  Referral Date: 03/10/18 Referral Source: MD office Referral Reason: " SW, imbalance" Insurance: [facility] Medicare   Outreach attempt # 1 to patient. No answer at present and RN CM left HIPPA compliant message.      Plan: RN CM will make outreach attempt to patient within 3-4 business days. RN CM will send unsuccessful outreach letter to patient.   Enzo Montgomery, RN,BSN,CCM [place] Management Telephonic Care Management Coordinator Direct Phone: [phone] Toll Free: [phone] Fax: [phone]

## 2018-03-11 ENCOUNTER — Other Ambulatory Visit: Payer: Self-pay | Admitting: Neurology

## 2018-03-11 ENCOUNTER — Telehealth: Payer: Self-pay | Admitting: Pharmacist

## 2018-03-11 ENCOUNTER — Ambulatory Visit: Payer: Medicare Other | Admitting: Physical Therapy

## 2018-03-11 ENCOUNTER — Other Ambulatory Visit: Payer: Self-pay

## 2018-03-11 DIAGNOSIS — G43419 Hemiplegic migraine, intractable, without status migrainosus: Secondary | ICD-10-CM

## 2018-03-11 NOTE — Patient Outreach (Signed)
[place] [facility]) Care Management  03/11/2018  Dorothy White 1950-10-06 [id number]   Patient was called regarding medication adherence because she often forgets to take her medications (per referral).  Unfortunately, the patient did not answer her phone.  HIPAA compliant message was left on the patient's voicemail.   Plan: Send unsuccessful contact letter. Call patient back in 3-5 business days.   Elayne Guerin, PharmD, [place] Clinical Pharmacist [phone]

## 2018-03-11 NOTE — Patient Outreach (Signed)
[place] [facility]) Care Management  03/11/2018  Dorothy White 1951/04/07 [id number]  Successful outreach to the patient on today's date, HIPAA identifiers confirmed. BSW introduced self to the patient and the reason for today's call. BSW discussed the patients recent referral to social work for assistance with transportation and obtaining a caregiver utilizing her Medicaid benefit. The patient stated she does not feel as though she is in need of a caregiver.   The patient stated "I can't see well right now so I can't drive". The patient is interested in transportation options to assist the patient places such as the grocery store. The patient informed BSW she plans to have cataract surgery near the end of the month and is unable to wear her contacts at this time. The patient feels confident she will be driving independently once the surgery is complete. BSW explained to the patient that transportation resources to assist with errand type travel in [facility] include [place]. BSW explained the application process for [employer]. The patient declines this resource given the length of time it takes to become eligible for this service considering the patient would like assistance immediately and does not see transportation as a long term need. The patient is interested in learning more about [facility]. BSW provided the patient with the contact number to the [facility]. The patient plans to contact this agency directly in hopes of utilizing the service.  While on the phone BSW educated the patient on her [facility] benefits. BSW mailed the patient a Musician as well as an over the Therapist, occupational. BSW will plan a follow up call to the patient next month to confirm the patient is able to drive and no longer needs transportation resources. The patient is in agreement with this plan.  Daneen Schick, BSW, CDP [facility] [phone]

## 2018-03-12 ENCOUNTER — Ambulatory Visit: Payer: Self-pay | Admitting: *Deleted

## 2018-03-12 ENCOUNTER — Encounter: Payer: Self-pay | Admitting: *Deleted

## 2018-03-12 ENCOUNTER — Other Ambulatory Visit: Payer: Self-pay | Admitting: *Deleted

## 2018-03-12 ENCOUNTER — Encounter: Payer: Self-pay | Admitting: Physical Therapy

## 2018-03-12 NOTE — Patient Outreach (Signed)
[place] [facility]) Care Management  03/12/2018  [name] 09-27-1950 [id number]    Referral received 8/14   RN unsuccessful on the initial call today but able to leave a HIPAA approved voice message requesting a call back. Will reschedule another follow up call next week for pending services.   Raina Mina, RN Care Management Coordinator [place] Office [phone]

## 2018-03-16 ENCOUNTER — Other Ambulatory Visit: Payer: Self-pay | Admitting: *Deleted

## 2018-03-16 ENCOUNTER — Other Ambulatory Visit: Payer: Self-pay | Admitting: Pharmacist

## 2018-03-16 ENCOUNTER — Ambulatory Visit: Payer: Self-pay | Admitting: Pharmacist

## 2018-03-16 NOTE — Patient Outreach (Signed)
[facility]) Care Management  03/16/2018  Dorothy White Dec 31, 1950 [id number]   Patient was called regarding medication adherence because she often forgets to take her medications (per referral).  Unfortunately, the patient did not answer her phone.  HIPAA compliant message was left on the patient's voicemail.   Plan: Call patient back in 3-5 business days.  Elayne Guerin, PharmD, [place] Clinical Pharmacist [phone]

## 2018-03-16 NOTE — Patient Outreach (Signed)
[place] [facility]) Care Management  03/16/2018  Dorothy White [birth year] [id number]   Unsuccessful outreach attempt (2nd) however able to leave a HIPAA approved voice message requesting a call back. Will communicate with involved team members and attempt another call next week and further intervene at that time. Will schedule another call next week prior to closure if no respond to outreach letter or with other [facility] team members.   Raina Mina, RN Care Management Coordinator [place] Office [phone]

## 2018-03-17 ENCOUNTER — Encounter: Payer: Self-pay | Admitting: Physical Therapy

## 2018-03-19 ENCOUNTER — Encounter

## 2018-03-22 ENCOUNTER — Other Ambulatory Visit: Payer: Self-pay | Admitting: *Deleted

## 2018-03-22 NOTE — Patient Outreach (Signed)
[place] [facility]) Care Management  03/22/2018  AYLEN STRADFORD 10-Nov-1950 [id number]    Unsuccessful outreach (3rd)  RN attempted outreach call today however remains unsuccessful to both contact numbers noted via [place] [phone] with no voice mail to leave a message however the home number RN was able to leave a HIPAA approved voice message requesting a call back. Will inquire further at that time on pt's possible needs.   Case will be closed on 8/29 if no return call is made based upon outreach letter sent to pt earlier in this month.  Raina Mina, RN Care Management Coordinator [place] Office [phone]

## 2018-03-23 ENCOUNTER — Encounter: Payer: Self-pay | Admitting: Physical Therapy

## 2018-03-24 ENCOUNTER — Ambulatory Visit: Payer: Self-pay | Admitting: Pharmacist

## 2018-03-26 ENCOUNTER — Encounter: Payer: Self-pay | Admitting: Physical Therapy

## 2018-03-30 ENCOUNTER — Other Ambulatory Visit: Payer: Self-pay | Admitting: *Deleted

## 2018-03-30 ENCOUNTER — Encounter: Payer: Self-pay | Admitting: *Deleted

## 2018-03-30 NOTE — Patient Outreach (Addendum)
[place] [facility]) Care Management  03/30/2018  Dorothy White 05/01/1951 [id number]  Case Closure (unsuccessful contacts)  Due to unsuccessful contacts case will be closed by this discipline. Will notify other team involved with this pt via [facility] and continue to be available if pt needs case management assistance   Raina Mina, RN Care Management Coordinator [place] Office [phone]

## 2018-03-31 ENCOUNTER — Other Ambulatory Visit: Payer: Self-pay | Admitting: Pharmacist

## 2018-03-31 ENCOUNTER — Ambulatory Visit: Payer: Self-pay | Admitting: Pharmacist

## 2018-03-31 NOTE — Patient Outreach (Signed)
[facility] [place] [facility]) Care Management  03/31/2018  Dorothy White 06-Jul-1951 [id number]   Patient was called today regarding medication adherence. Unfortunately, she did not answer her phone. Today's call was the third call. Patient was sent an unsuccessful contact letter on 03/12/18.  [place] Nurse has already closed the patient due to unsuccessful outreach.  Plan: Close patient's pharmacy case. Send note to [place] Worker as she is still active with the patient.   Elayne Guerin, PharmD, [place] Clinical Pharmacist [phone]

## 2018-04-01 NOTE — Patient Outreach (Addendum)
[facility]) Care Management  04/01/2018  Dorothy White May 18, 1951 [id number]   Patient was called regarding medication assistance. Unfortunately, she did not answer the phone. The patient was sent an unsuccessful contact letter on 03/12/18 by Raina Mina, RN.    Plan: Close patient's pharmacy case. Send letter to [place], [facility] as she is still active.   Elayne Guerin, PharmD, [place] Clinical Pharmacist [phone]

## 2018-04-06 ENCOUNTER — Other Ambulatory Visit: Payer: Self-pay | Admitting: Neurology

## 2018-04-06 DIAGNOSIS — G43419 Hemiplegic migraine, intractable, without status migrainosus: Secondary | ICD-10-CM

## 2018-04-10 IMAGING — CT CT ABD-PELV W/ CM
2 of 5 series · 15 of 46 positions shown, 17 images · IV contrast (Omni 300)
Comparison: 10/28/2013

CLINICAL DATA: Left lower quadrant pain for 1 week with nausea
vomiting and diarrhea. Fever.

EXAM:
CT ABDOMEN AND PELVIS WITH CONTRAST
TECHNIQUE: Multidetector CT imaging of the abdomen and pelvis was performed
using the standard protocol following bolus administration of
intravenous contrast.
CONTRAST:  100mL 2YJYMC-6AA IOPAMIDOL (2YJYMC-6AA) INJECTION 61%

[Series 2: a/p w/ 5mm · axial · 0.71mm/px · z∈[-646,-221]mm · 12 of 95 slices shown, 14 images]
[im 5/95  soft-tissue]
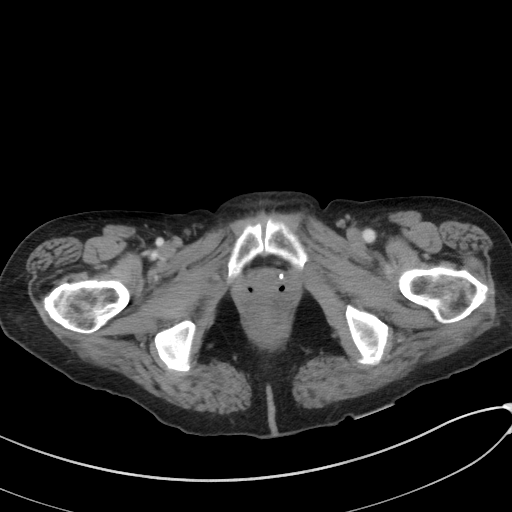
[im 5/95  bone]
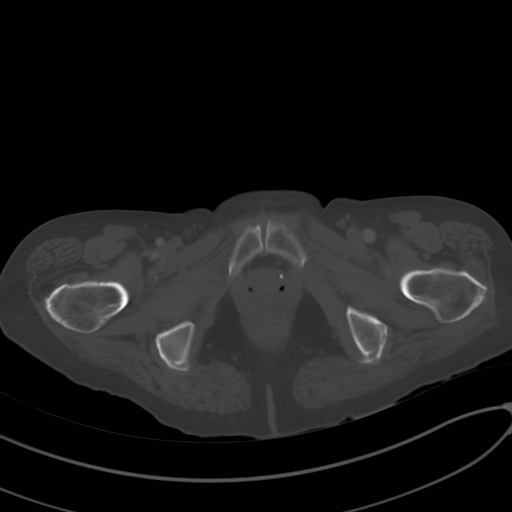
[im 15/95  soft-tissue]
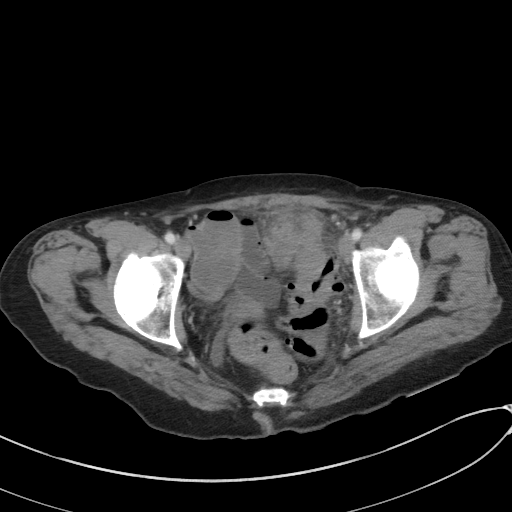
[im 20/95  soft-tissue]
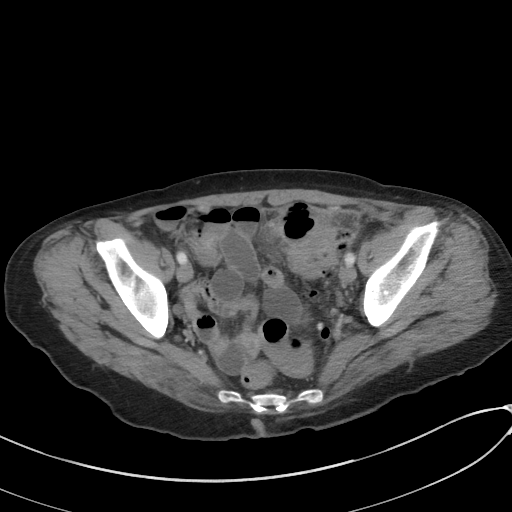
[im 30/95  soft-tissue]
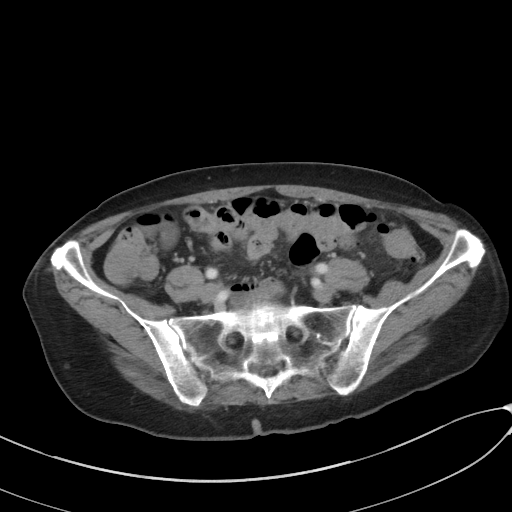
[im 35/95  soft-tissue]
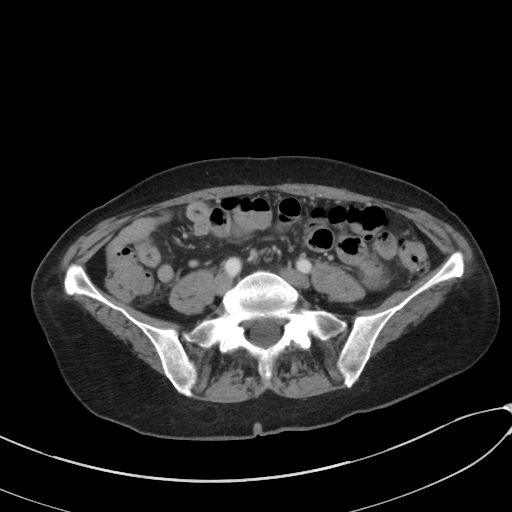
[im 45/95  soft-tissue]
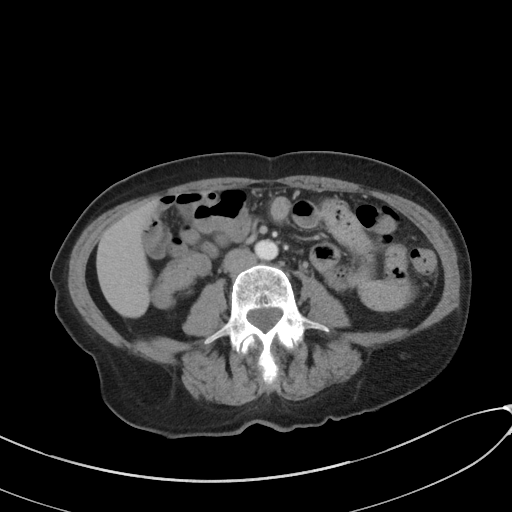
[im 50/95  soft-tissue]
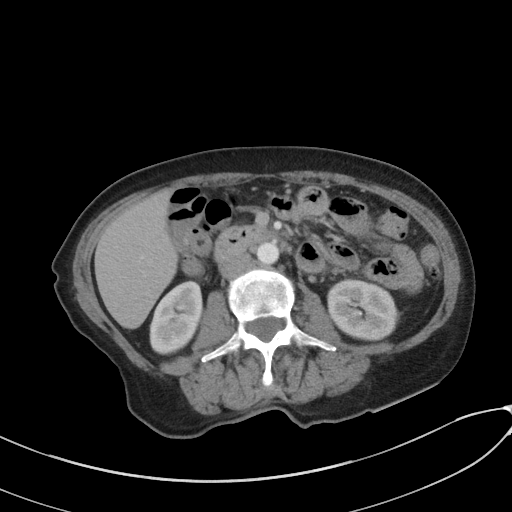
[im 60/95  soft-tissue]
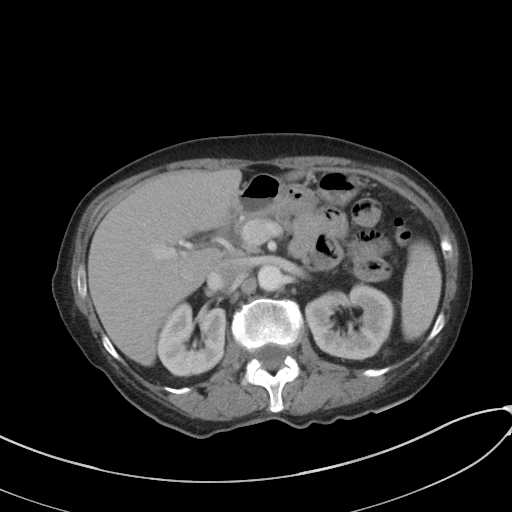
[im 65/95  soft-tissue]
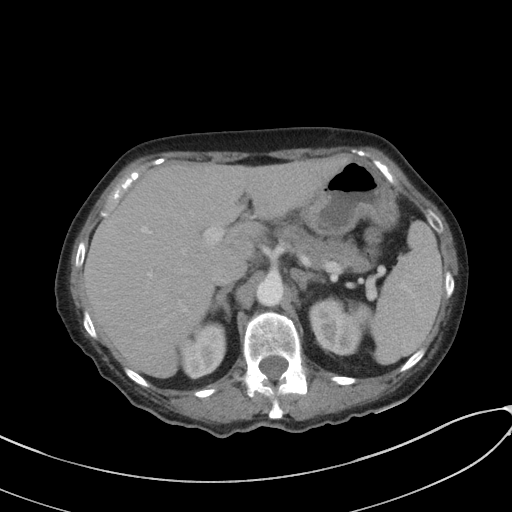
[im 65/95  bone]
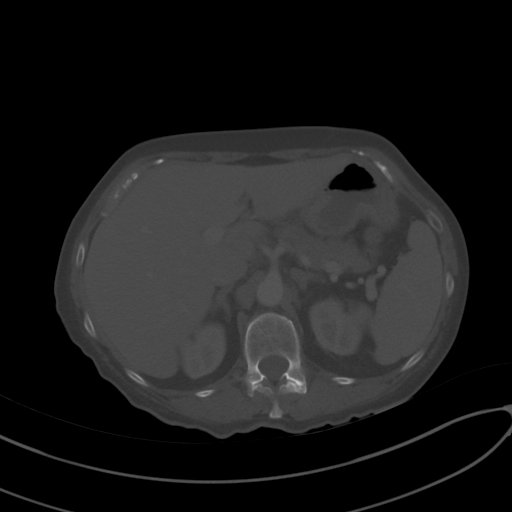
[im 75/95  soft-tissue]
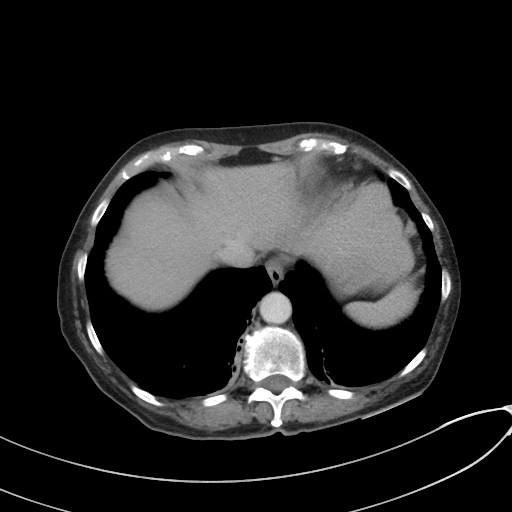
[im 80/95  soft-tissue]
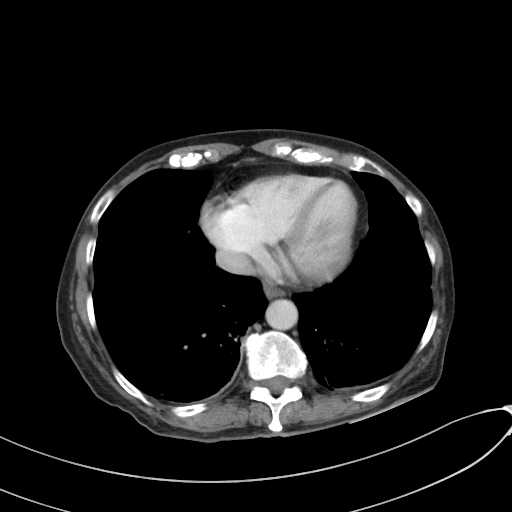
[im 90/95  soft-tissue]
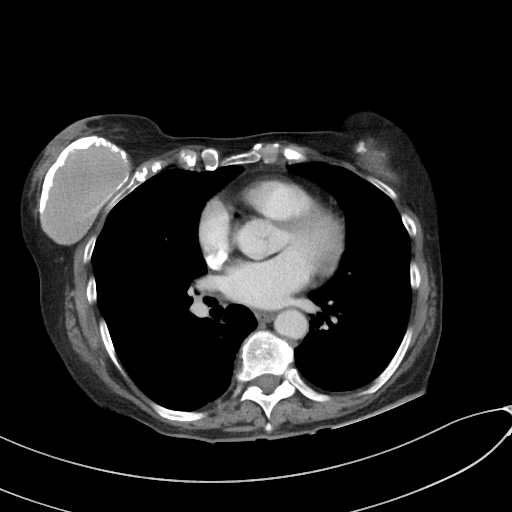

[Series 5: a/p w/ cor · coronal · 0.64mm/px · 3 of 119 slices shown]
[im 40/119  soft-tissue]
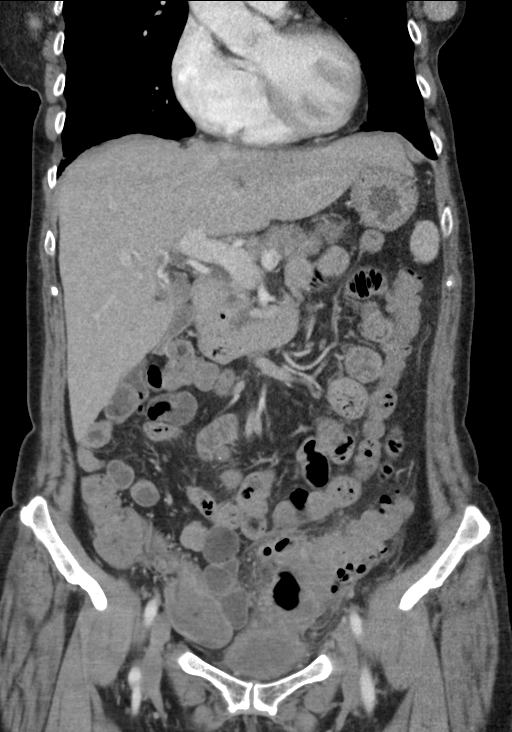
[im 53/119  soft-tissue]
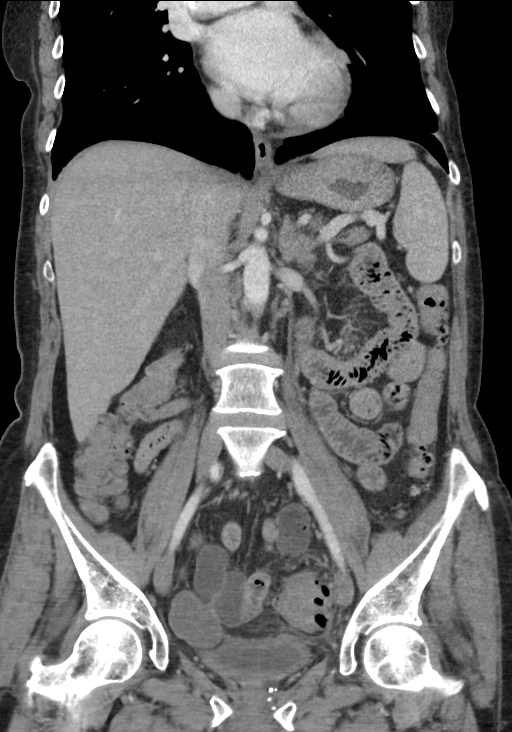
[im 66/119  soft-tissue]
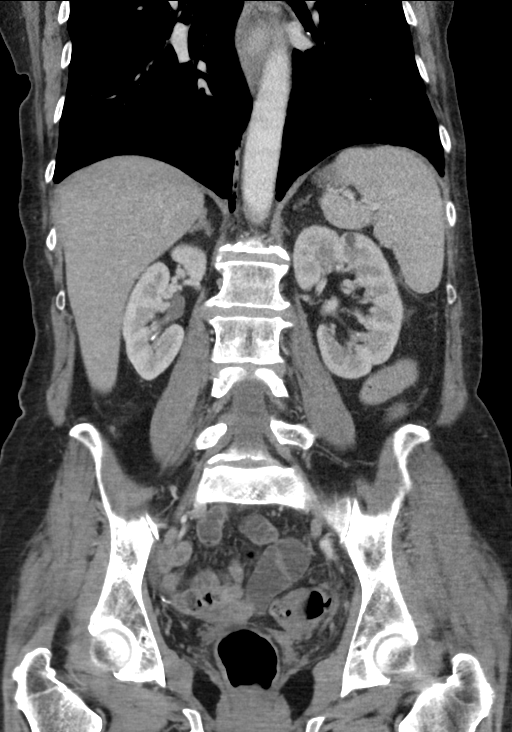

[15 of 46 positions shown; findings below may reference images not displayed]

FINDINGS: Lower chest: Dependent atelectasis seen in the lower lobes
bilaterally.

Hepatobiliary: No focal abnormality within the liver parenchyma.
There is no evidence for gallstones, gallbladder wall thickening, or
pericholecystic fluid. No intrahepatic or extrahepatic biliary
dilation.

Pancreas: Ill-defined hypo enhancing soft tissue in the anterior
pancreatic head is associated with some mild distention of the main
pancreatic duct in the same region.

Spleen: No splenomegaly. No focal mass lesion.

Adrenals/Urinary Tract: No adrenal nodule or mass. Early excretion
of contrast noted in the collecting systems bilaterally. Tiny cysts
identified upper pole left kidney. No enhancing lesion identified in
either kidney. No evidence for hydroureter. Mild circumferential
bladder wall thickening appears slightly out of proportion to the
degree of decompression.

Stomach/Bowel: Stomach is nondistended. No gastric wall thickening.
No evidence of outlet obstruction. Duodenum is normally positioned
as is the ligament of Treitz. No small bowel wall thickening. No
small bowel dilatation. The terminal ileum is normal. The appendix
is not visualized, but there is no edema or inflammation in the
region of the cecum. Diverticular changes are noted in the left
colon. In the proximal sigmoid segment, there is colonic wall
thickening with pericolonic edema/inflammation and a focal
relatively thick walled collection of fluid and gas measuring 4.2 x
3.2 x 5.3 cm.

Vascular/Lymphatic: No abdominal aortic aneurysm. There is no
gastrohepatic or hepatoduodenal ligament lymphadenopathy. No
intraperitoneal or retroperitoneal lymphadenopathy. No pelvic
sidewall lymphadenopathy.

Reproductive: Uterus is surgically absent. There is no adnexal mass.

Other: Trace intraperitoneal free fluid noted in the cul-de-sac.

Musculoskeletal: Bone windows reveal no worrisome lytic or sclerotic
osseous lesions.
IMPRESSION: 1. Proximal sigmoid diverticulitis with contained para colonic
abscess as above. This is contiguous with the urinary bladder which
may account for the mild circumferential bladder wall thickening.
2. Ill-defined low-attenuation in the head of the pancreas with
dilatation of the main pancreatic duct. Adenocarcinoma cannot be
excluded. MRI of the abdomen without and with contrast is
recommended to further evaluate.
3. Trace free fluid in the cul-de-sac.

## 2018-04-11 IMAGING — MR MR 3D RECON AT SCANNER
10 of 19 series · 21 of 48 positions shown · IV contrast (multihance)
Comparison: 03/12/2016 CT

CLINICAL DATA: CT demonstrating a possible pancreatic head lesion.

EXAM:
MRI ABDOMEN WITHOUT AND WITH CONTRAST
TECHNIQUE: Multiplanar multisequence MR imaging of the abdomen was performed
both before and after the administration of intravenous contrast.
CONTRAST:  13mL MULTIHANCE GADOBENATE DIMEGLUMINE 529 MG/ML IV SOLN

[Series 3: T2 · axial · 5.0mm · 0.78mm/px · z∈[-134,+76]mm · 2 of 43 slices shown (1 of 2)]
[im 1/43]
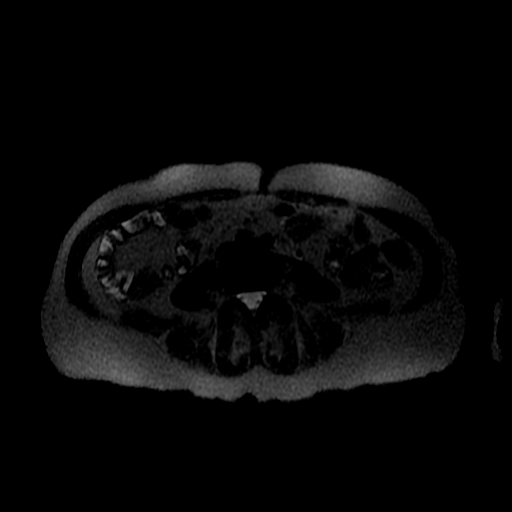
[im 43/43]
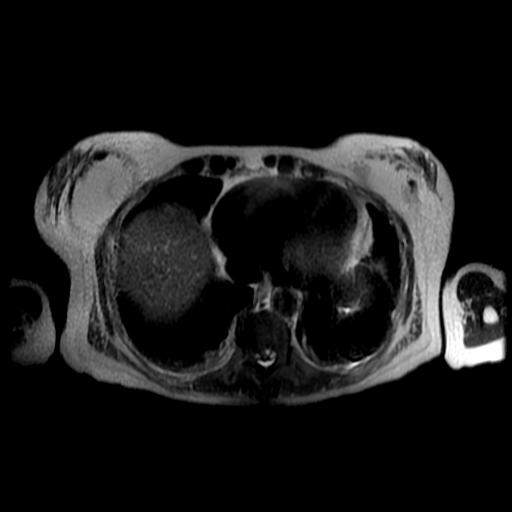

[Series 4: T2 · coronal · 5.0mm · 0.78mm/px · 1 of 34 slices shown (2 of 2)]
[im 1/34]
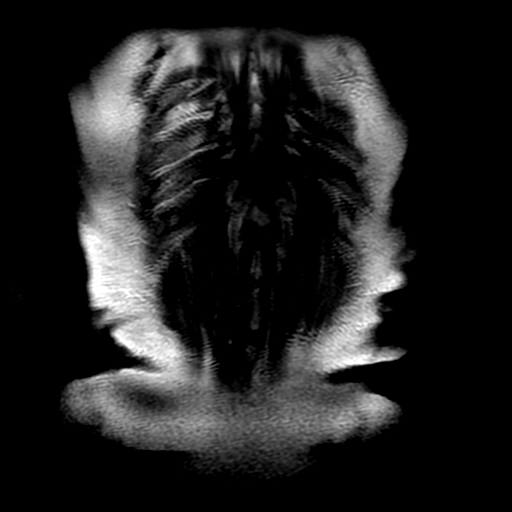

[Series 5: T2 fat-sat · axial · 5.0mm · 0.78mm/px · 1 of 43 slices shown]
[im 1/43]
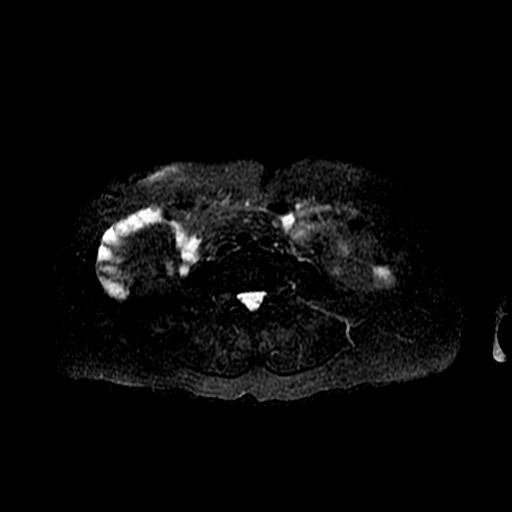

[Series 6: DWI b500 · axial · 6.0mm · 1.48mm/px · z∈[-143,+83]mm · 2 of 60 slices shown]
[im 1/60]
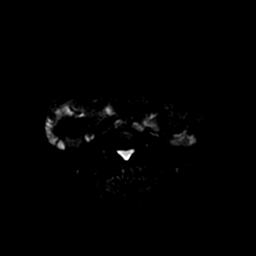
[im 60/60]
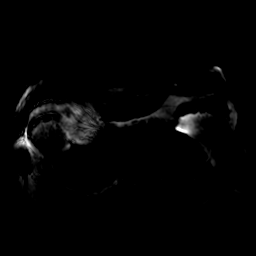

[Series 9: ax dualecho · axial · 5.0mm · 0.78mm/px · z∈[-145,+70]mm · 3 of 88 slices shown]
[im 1/88]
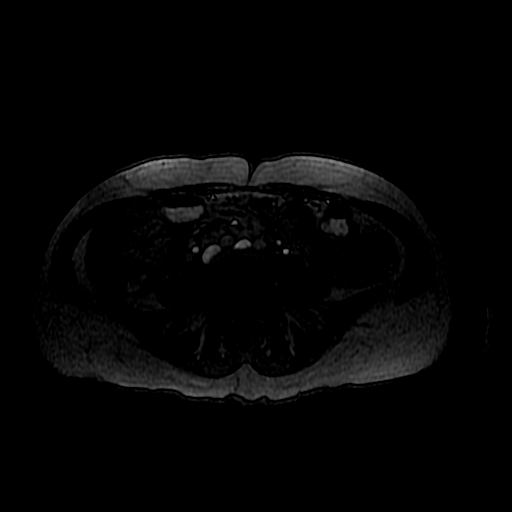
[im 44/88]
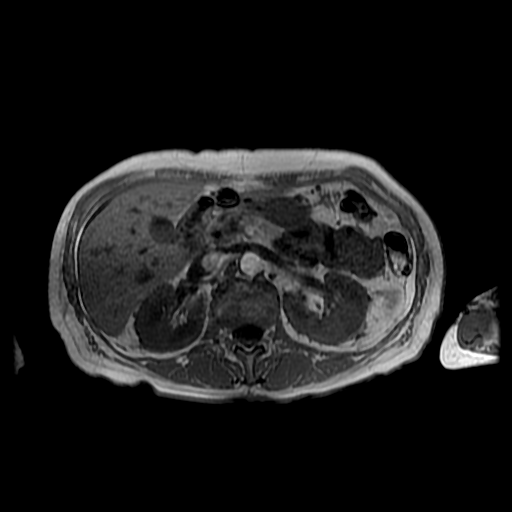
[im 88/88]
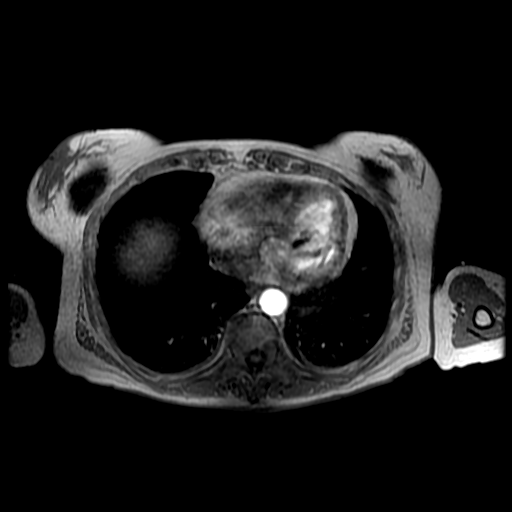

[Series 10: T1 fat-sat · axial · 5.0mm · 0.78mm/px · z∈[-145,+70]mm · 2 of 44 slices shown]
[im 1/44]
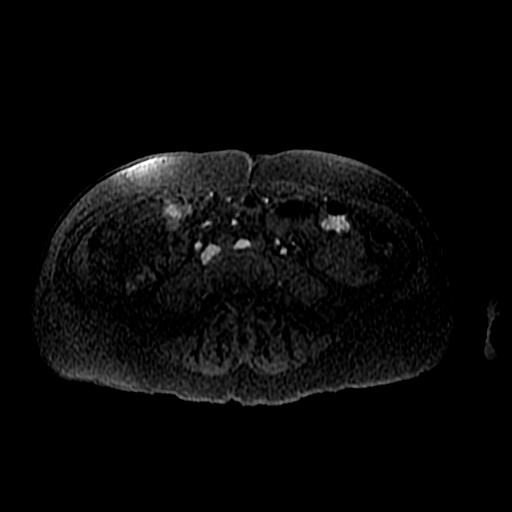
[im 44/44]
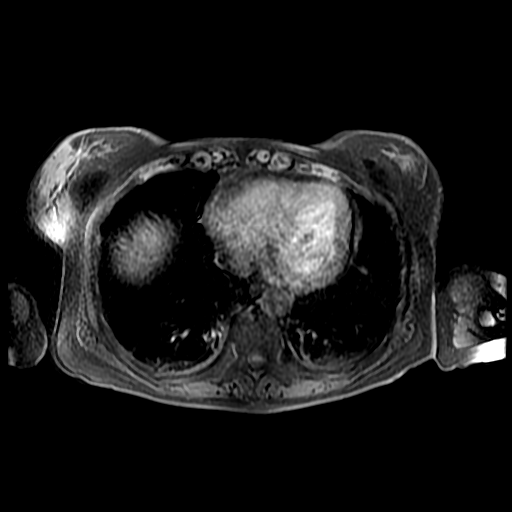

[Series 13: T1 dynamic post-contrast · coronal · 5.0mm · 0.78mm/px · 3 of 88 slices shown]
[im 1/88]
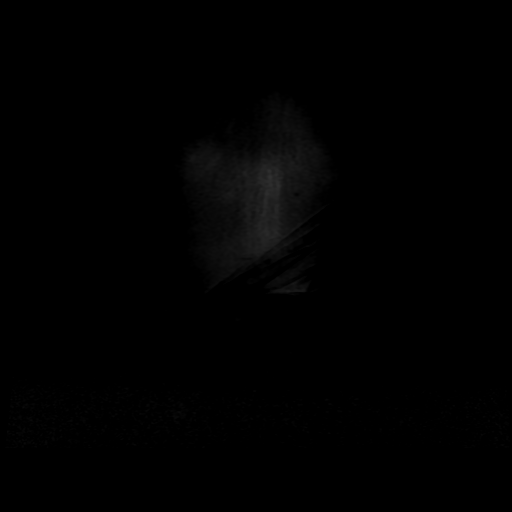
[im 44/88]
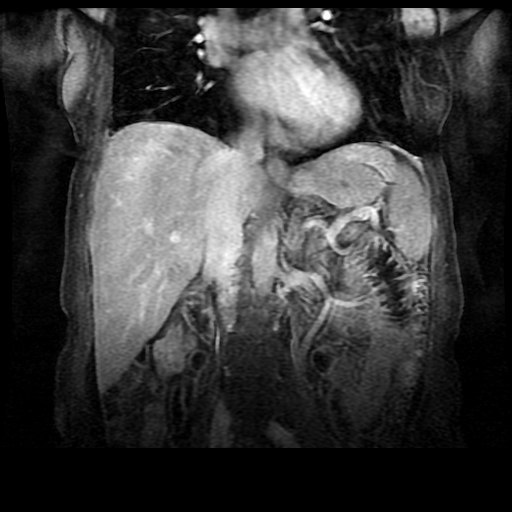
[im 88/88]
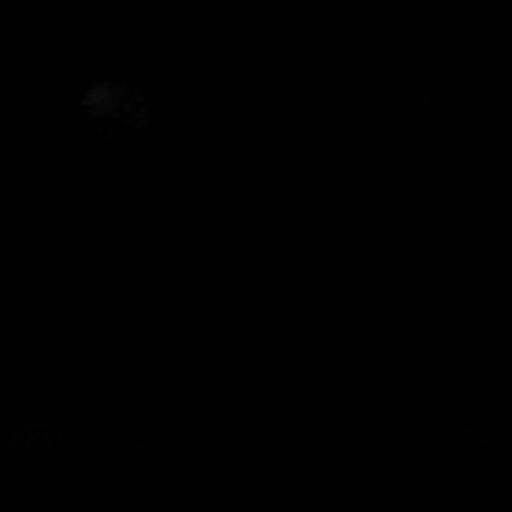

[Series 14: T1 dynamic · axial · delayed · 5.0mm · 0.78mm/px · z∈[-150,+68]mm · 3 of 88 slices shown (1 of 2)]
[im 1/88]
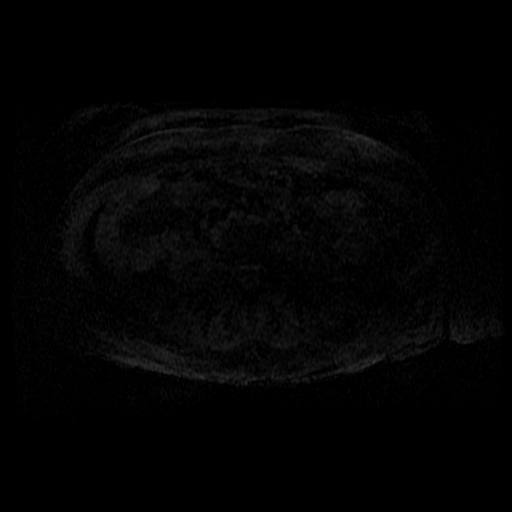
[im 44/88]
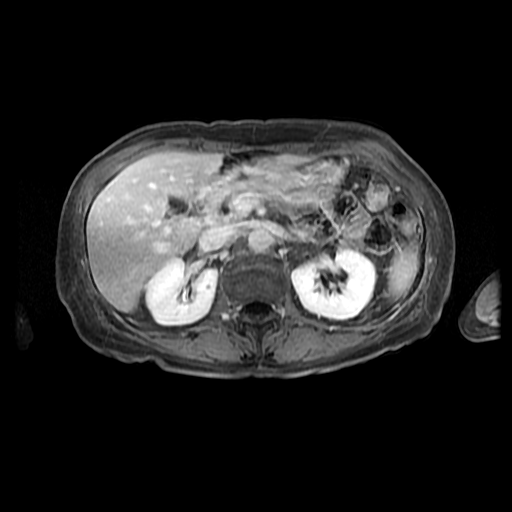
[im 88/88]
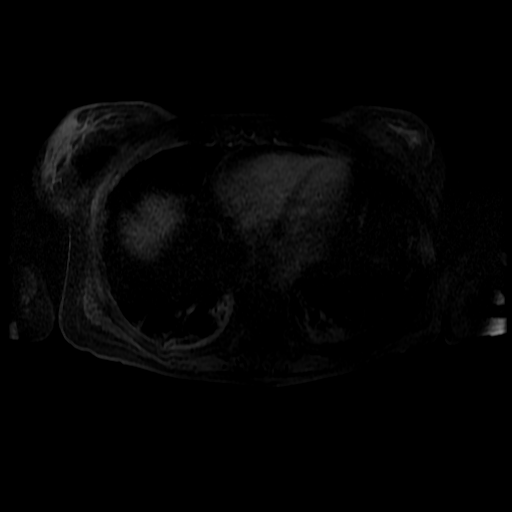

[Series 600: DWI · axial · 6.0mm · 1.48mm/px · 1 of 29 slices shown]
[im 1/29]
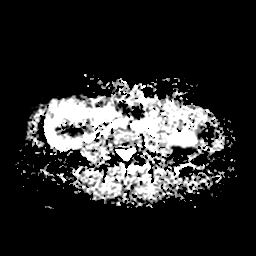

[Series 1200: T1 dynamic · axial · 5.0mm · 0.78mm/px · z∈[-150,+68]mm · 3 of 88 slices shown (2 of 2)]
[im 1/88]
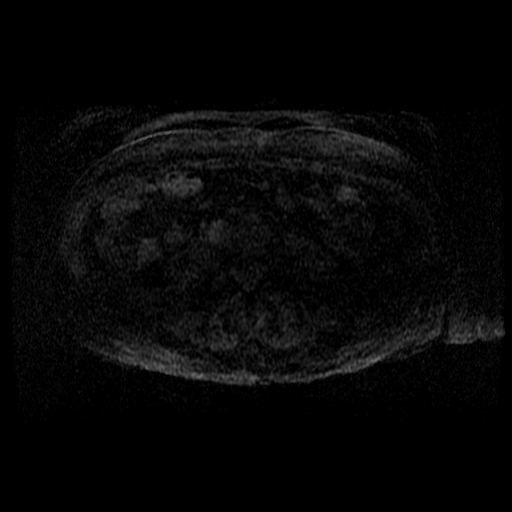
[im 44/88]
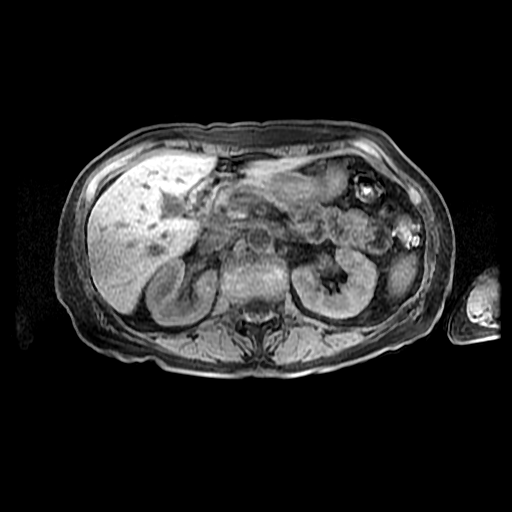
[im 88/88]
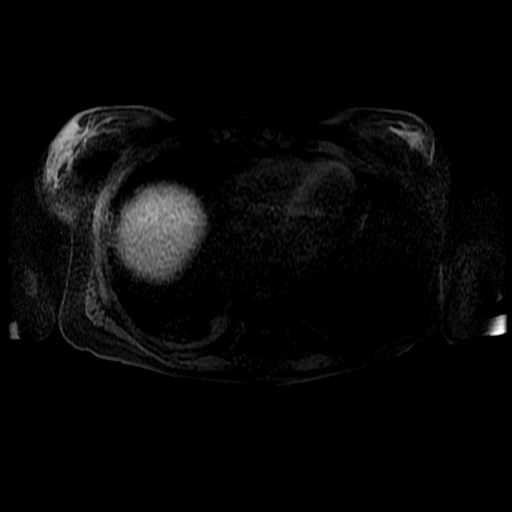

[21 of 48 positions shown; findings below may reference images not displayed]

FINDINGS: Lower chest: Bilateral breast implants. Bibasilar atelectasis.
Normal heart size without pericardial or pleural effusion.

Hepatobiliary: No focal liver lesion. Hepatomegaly at 21.4 cm. There
is minimal left anterior hepatic lobe intrahepatic duct dilatation,
of indeterminate etiology. Example image 17/ series 51993. Normal
gallbladder, without biliary ductal dilatation.

Pancreas: No pancreatic duct dilatation. There is a variant of
pancreas divisum, with a prominent dorsal duct entering the duodenum
on image 26/ series 3. Communication with the common duct is
identified. The CT abnormality was likely secondary to the focal fat
deposition within the pancreas. Example image 51/series 9. No acute
pancreatitis.

Spleen: Normal in size, without focal abnormality.

Adrenals/Urinary Tract: Normal adrenal glands. Bilateral renal
cysts. No hydronephrosis.

Stomach/Bowel: Normal stomach and abdominal bowel loops.

Vascular/Lymphatic: Normal caliber of the aorta and branch vessels.
No retroperitoneal or retrocrural adenopathy.

Other: No ascites.

Musculoskeletal: No acute osseous abnormality.
IMPRESSION: 1. The CT abnormality represents focal fat deposition within the
pancreatic head. No suspicious pancreatic lesion.
2.  No acute abdominal process.
3. Variant of pancreas divisum.  No acute pancreatitis.

## 2018-04-13 ENCOUNTER — Ambulatory Visit: Payer: Self-pay

## 2018-04-13 ENCOUNTER — Other Ambulatory Visit: Payer: Self-pay

## 2018-04-13 NOTE — Patient Outreach (Signed)
[facility]) Care Management  04/13/2018  Dorothy White May 22, 1951 [id number]  BSW attempted to contact the patient on today's date to assess the patients need for resources. Unfortunately, today's call was unsuccessful. BSW left the patient a HIPAA compliant voice message requesting a return call.   Plan: BSW will mail the patient an unsuccessful outreach letter. BSW will attempt the patient again within the next four business days.  Daneen Schick, BSW, CDP [facility] [phone]

## 2018-04-14 DIAGNOSIS — S0181XA Laceration without foreign body of other part of head, initial encounter: Secondary | ICD-10-CM | POA: Diagnosis not present

## 2018-04-14 DIAGNOSIS — S0180XA Unspecified open wound of other part of head, initial encounter: Secondary | ICD-10-CM | POA: Diagnosis not present

## 2018-04-16 DIAGNOSIS — S0990XA Unspecified injury of head, initial encounter: Secondary | ICD-10-CM | POA: Diagnosis not present

## 2018-04-16 IMAGING — XA IR FISTULA/SINUS TRACT
5 series · 5 of 5 positions shown · non-contrast
Comparison: None.

INDICATION: Sigmoid diverticular abscess and status post percutaneous drainage
on 03/13/2016. Diminished output via drain with resolution of
drainable abscess by CT. Drain injection performed prior to possible
drain removal.

EXAM:
SINUS TRACT INJECTION / FISTULOGRAM
TECHNIQUE: Fluoroscopic images were obtained during abscess drain injection.
Following the injection procedure, the drain was removed and a
dressing applied over the exit site.
PROCEDURE:
Contrast injection demonstrates a collapsed abscess cavity
surrounding the pigtail drainage catheter. There is no evidence of
fistula to adjacent bowel. The drainage catheter was successfully
removed.

[Series 1: fl (-) angio · 1 of 1 slices shown (1 of 5)]
[im 1/1]
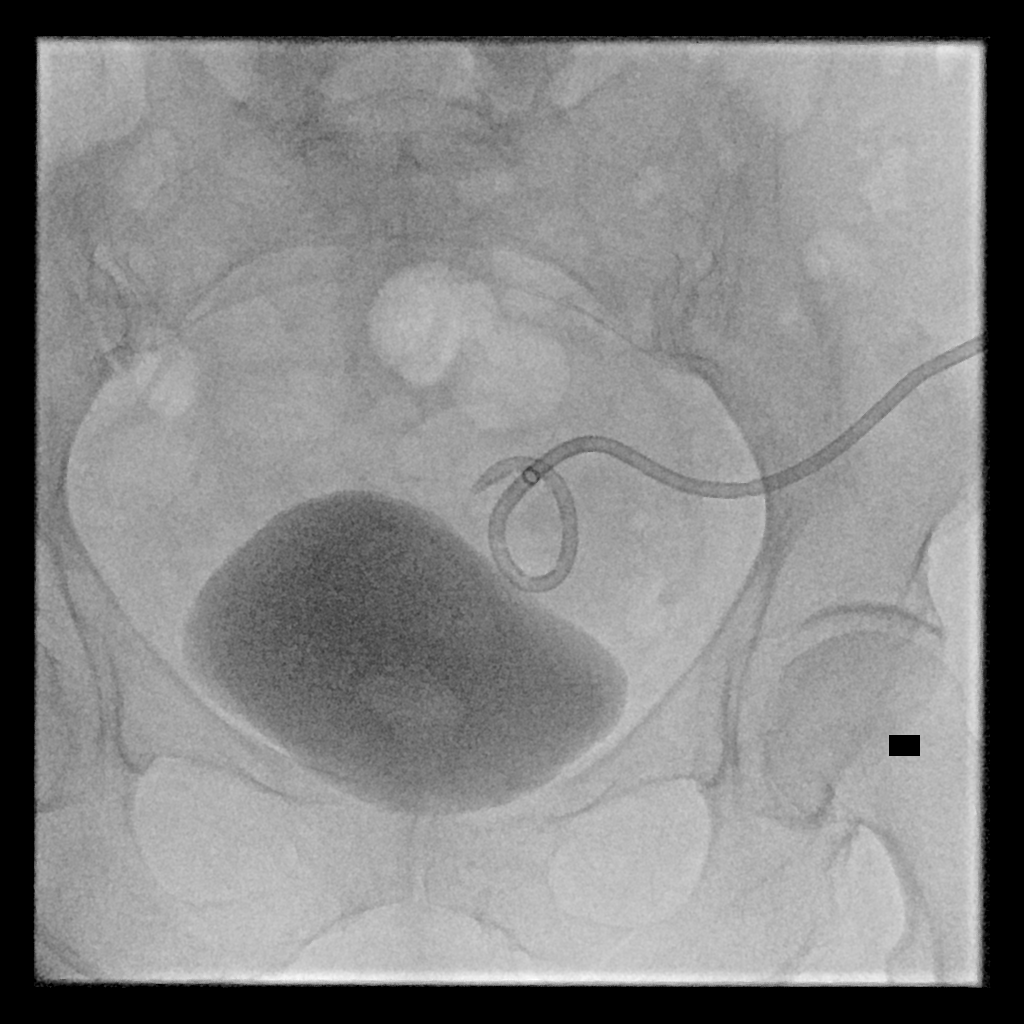

[Series 2: fl (-) angio · 1 of 1 slices shown (2 of 5)]
[im 1/1]
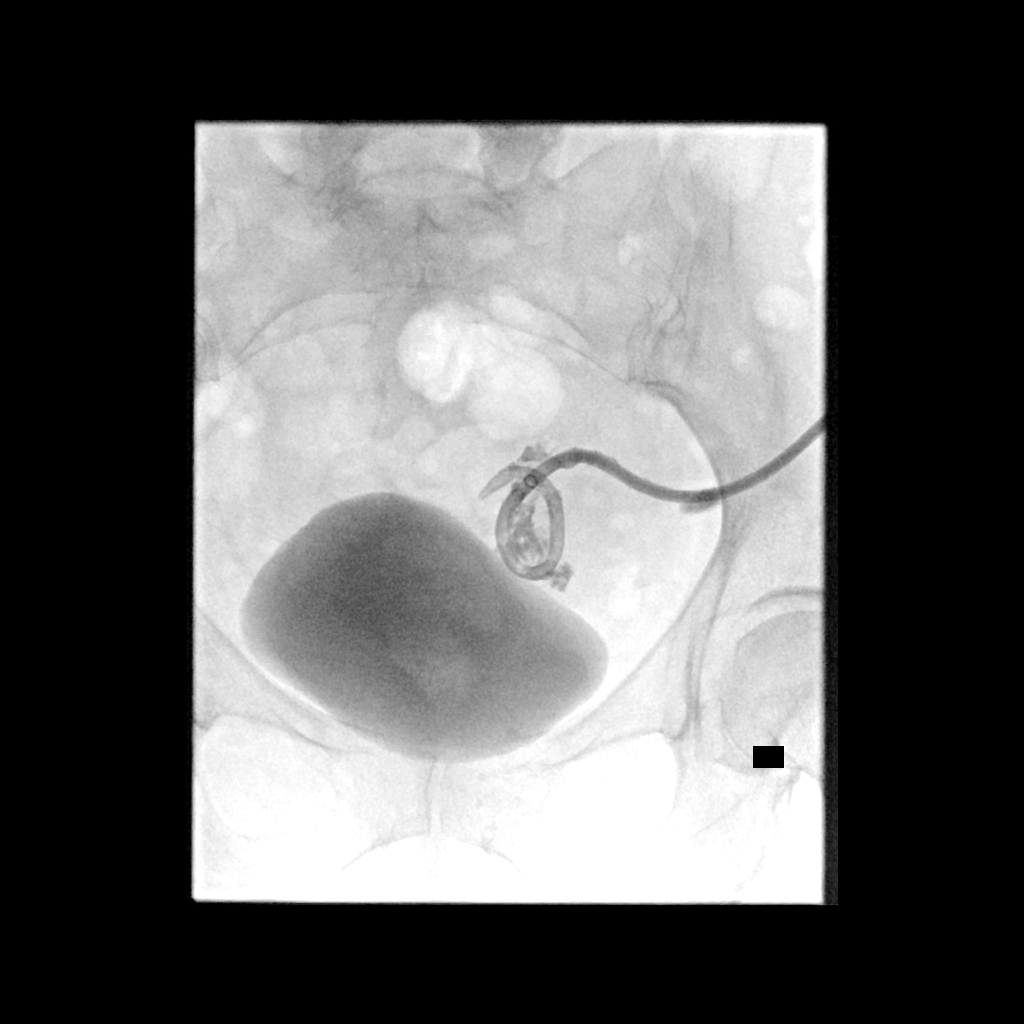

[Series 3: fl (-) angio · 1 of 1 slices shown (3 of 5)]
[im 1/1]
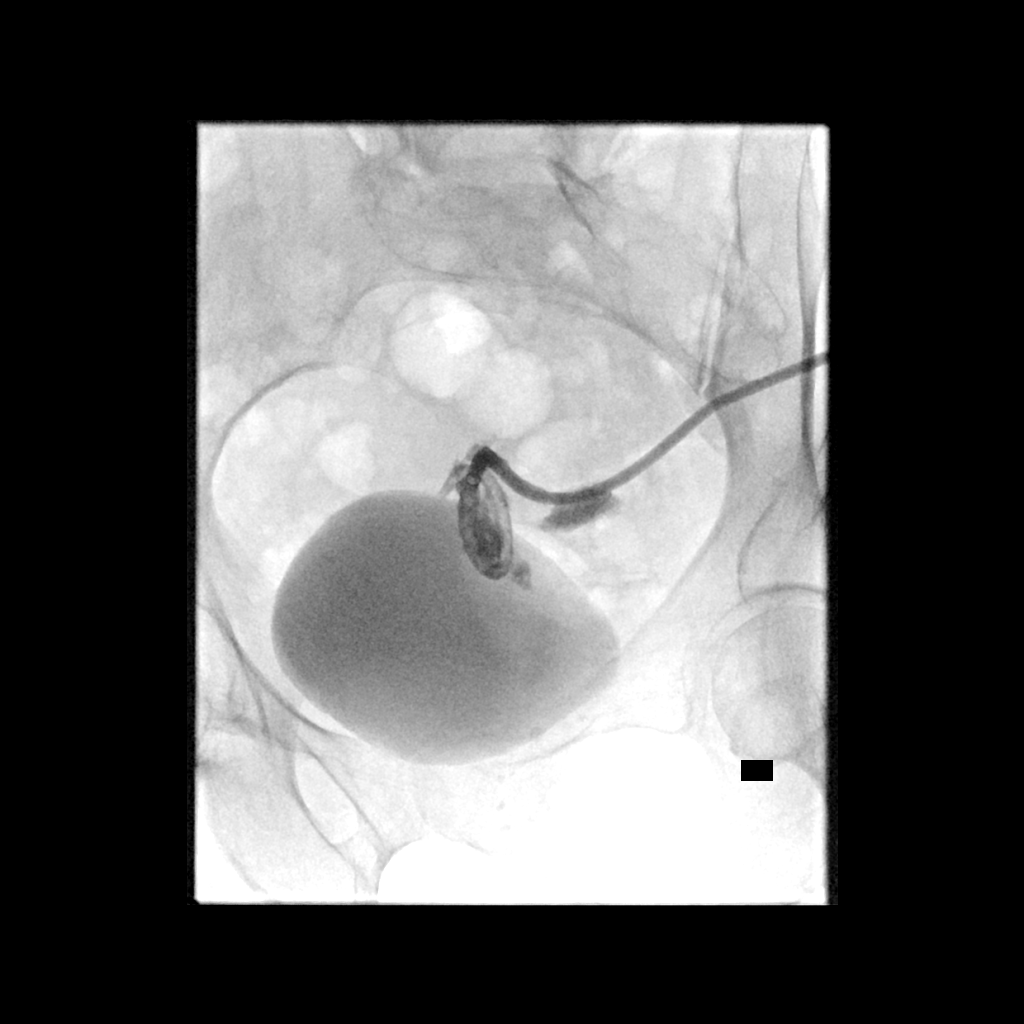

[Series 4: fl (-) angio · 1 of 1 slices shown (4 of 5)]
[im 1/1]
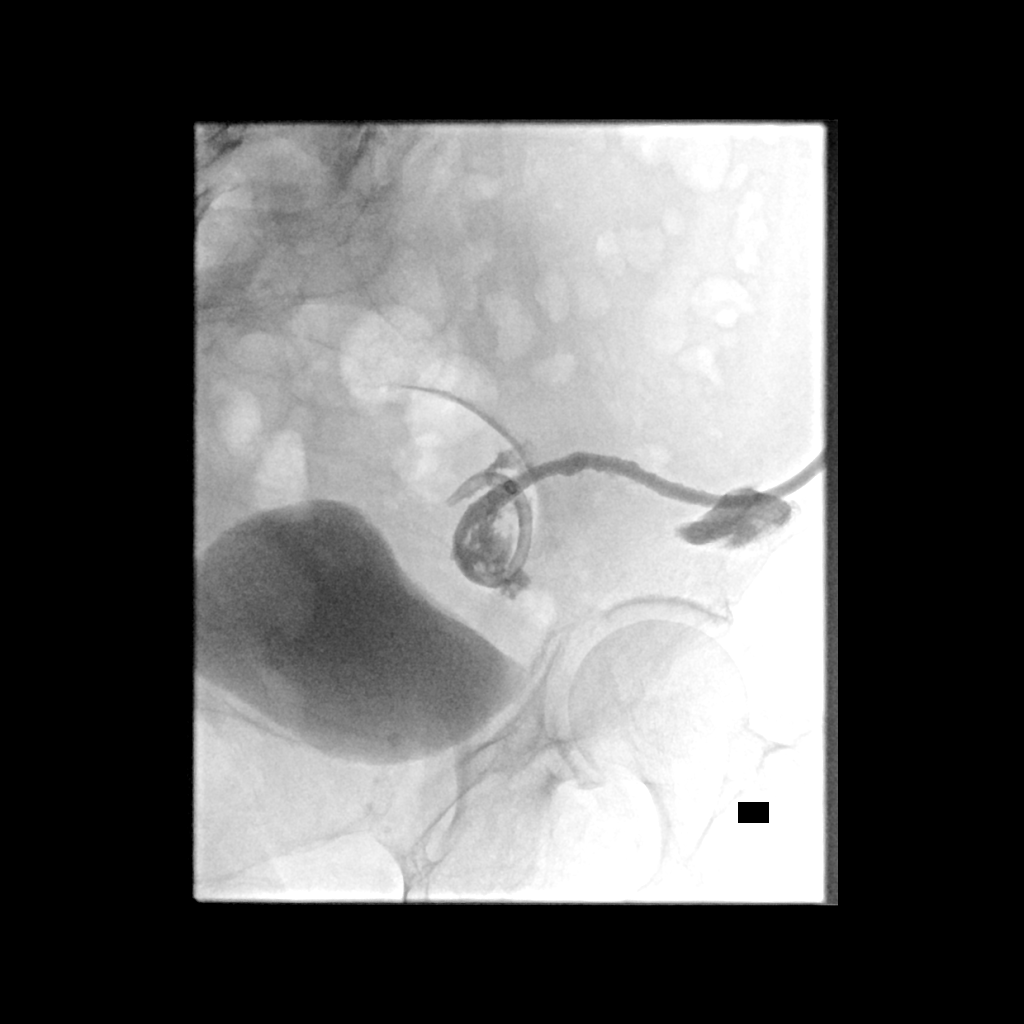

[Series 5: fl (-) angio · 1 of 1 slices shown (5 of 5)]
[im 1/1]
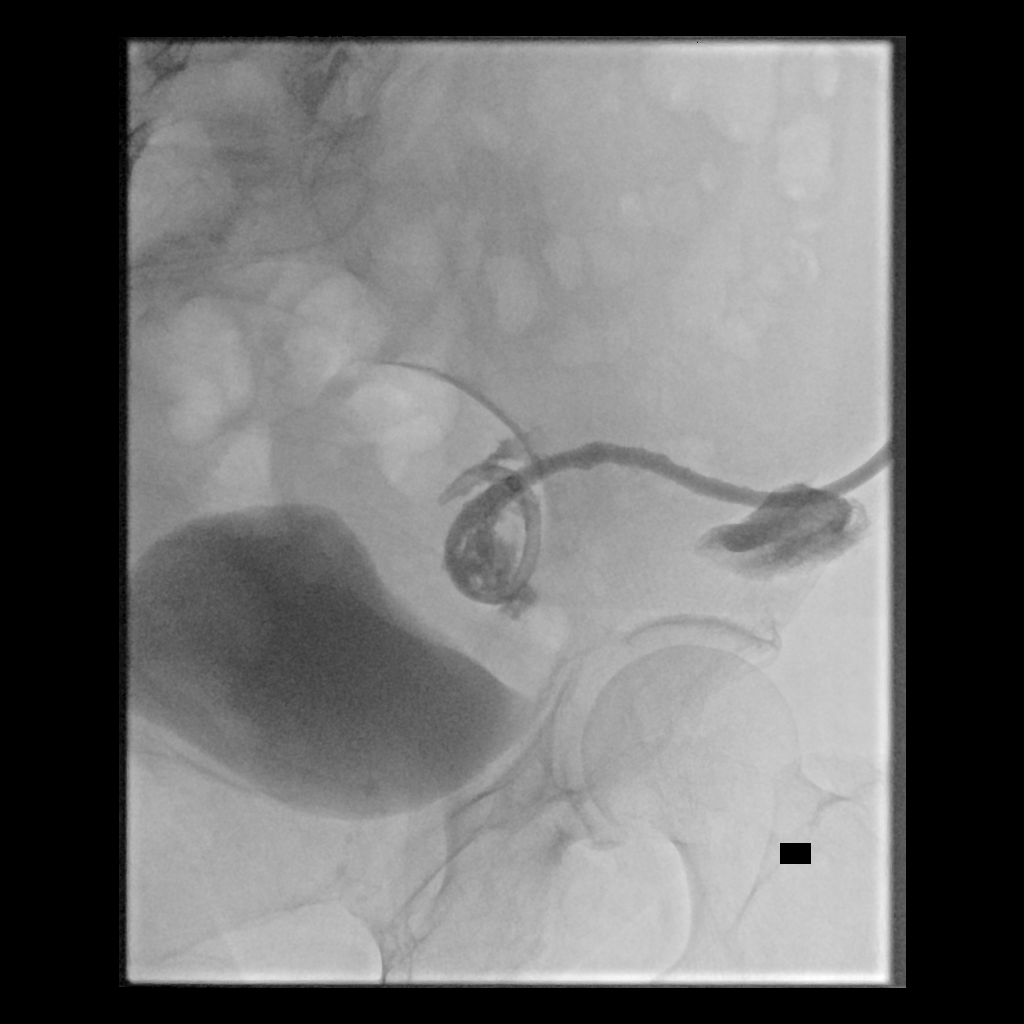

[5 of 5 positions shown; findings below may reference images not displayed]

MEDICATIONS:
None

ANESTHESIA/SEDATION:
None

FLUOROSCOPY TIME:  36 seconds

CONTRAST:  10 mL [id number] injected through the drainage catheter.

COMPLICATIONS:
None.
IMPRESSION: Diverticular abscess drain injection demonstrates no fistula to
bowel. The drainage catheter was removed.

## 2018-04-16 IMAGING — CT CT ABD-PELV W/ CM
2 of 5 series · 11 of 46 positions shown, 12 images · IV contrast (iopamidol)
Comparison: 03/13/2016

CLINICAL DATA: Re-evaluation of chronic diverticular abscess.

EXAM:
CT ABDOMEN AND PELVIS WITH CONTRAST
TECHNIQUE: Multidetector CT imaging of the abdomen and pelvis was performed
using the standard protocol following bolus administration of
intravenous contrast.
CONTRAST:  75mL [id number] IOPAMIDOL ([id number]) INJECTION 61%

[Series 201: routine, idose (2) · axial · 0.86mm/px · z∈[-467,-87]mm · 8 of 88 slices shown, 9 images]
[im 6/88  soft-tissue]
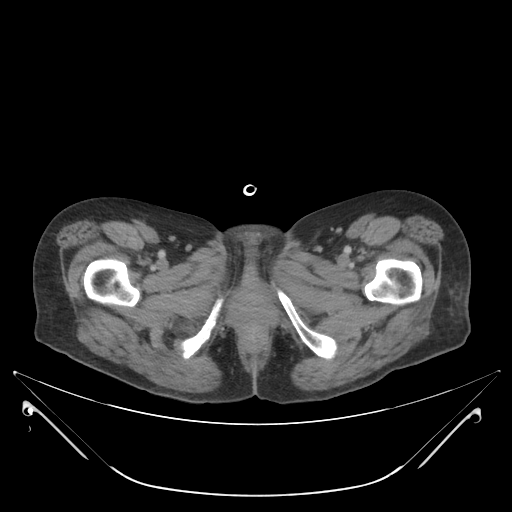
[im 6/88  bone]
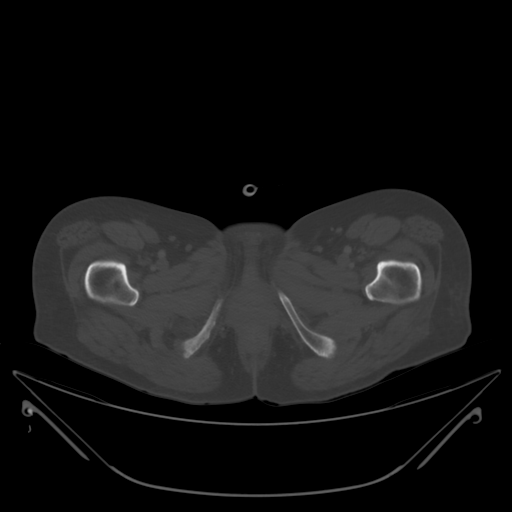
[im 17/88  soft-tissue]
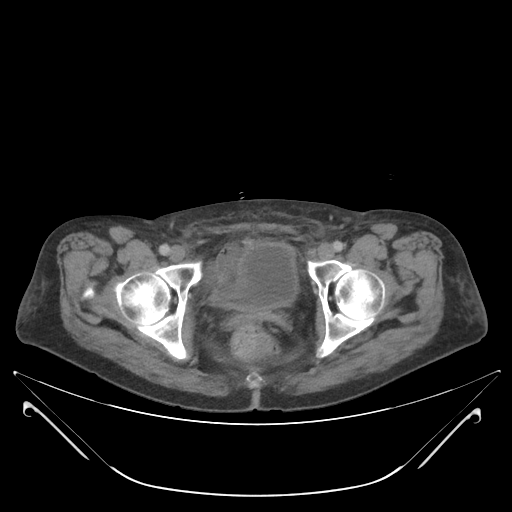
[im 28/88  soft-tissue]
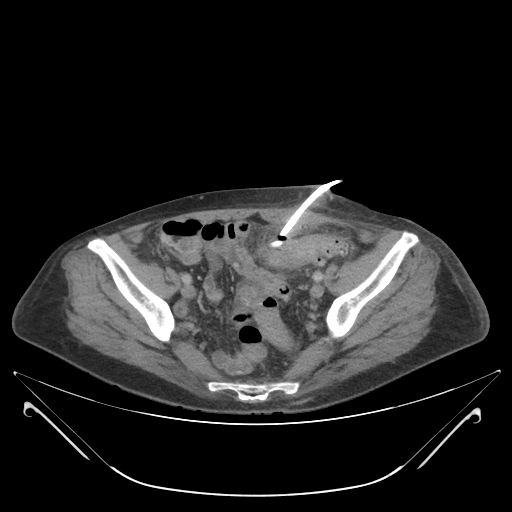
[im 39/88  soft-tissue]
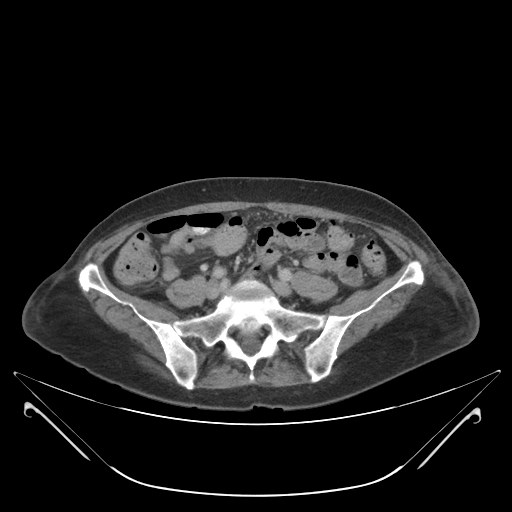
[im 49/88  soft-tissue]
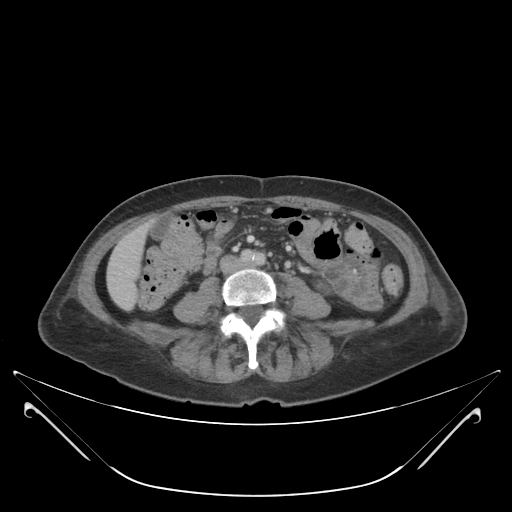
[im 60/88  soft-tissue]
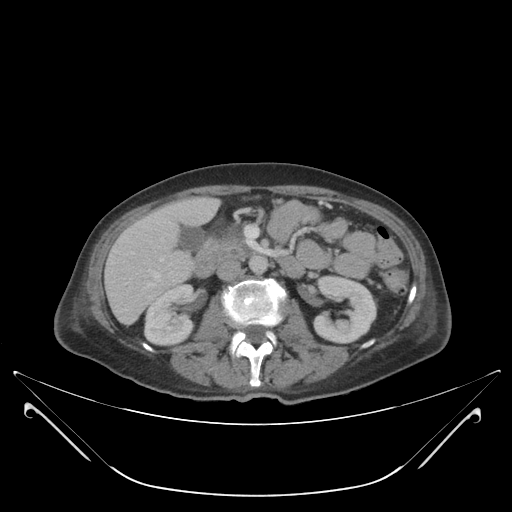
[im 71/88  soft-tissue]
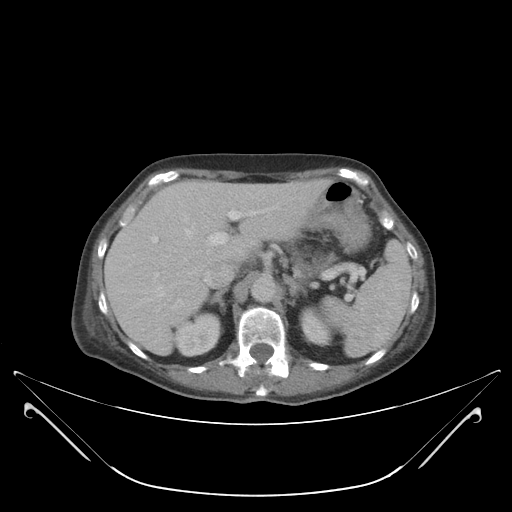
[im 82/88  soft-tissue]
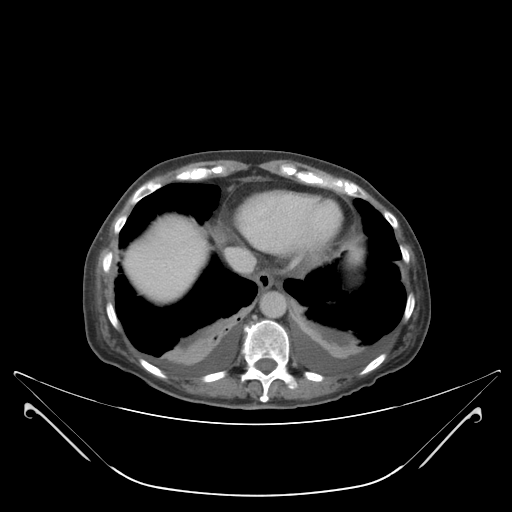

[Series 203: coronals, idose (2) · coronal · 0.45mm/px · 3 of 98 slices shown]
[im 33/98  soft-tissue]
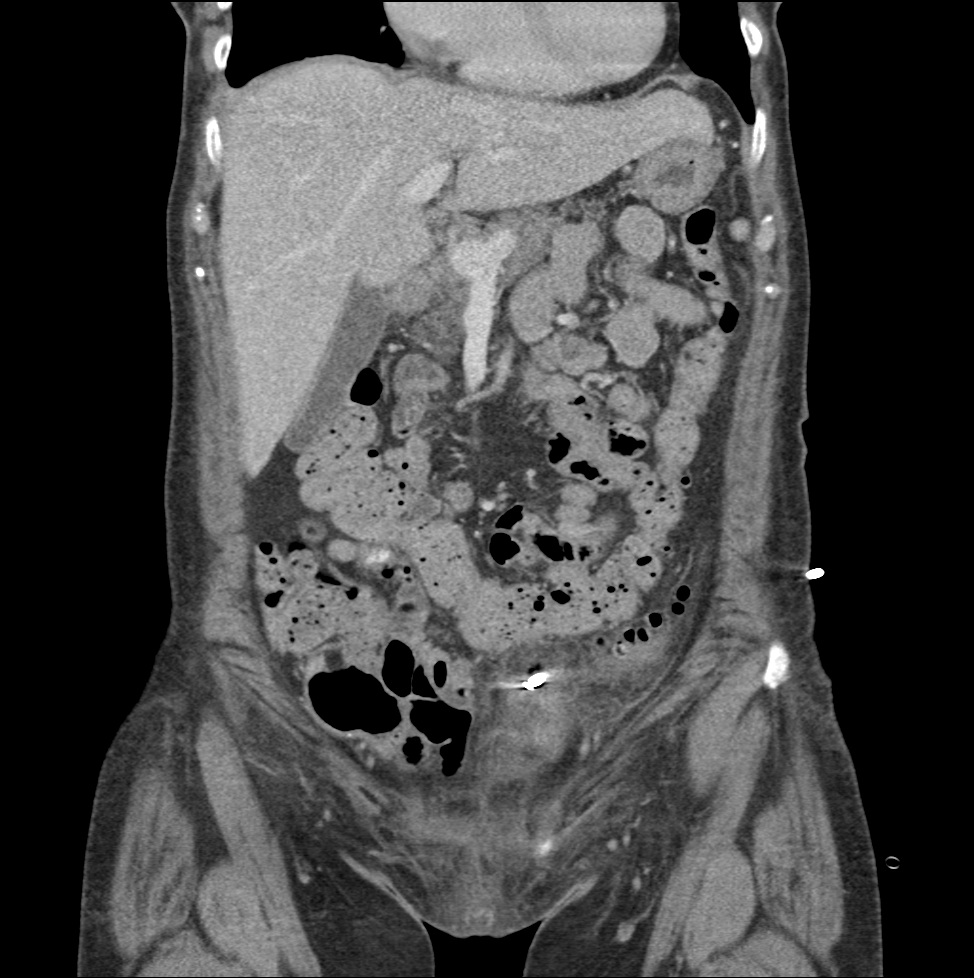
[im 44/98  soft-tissue]
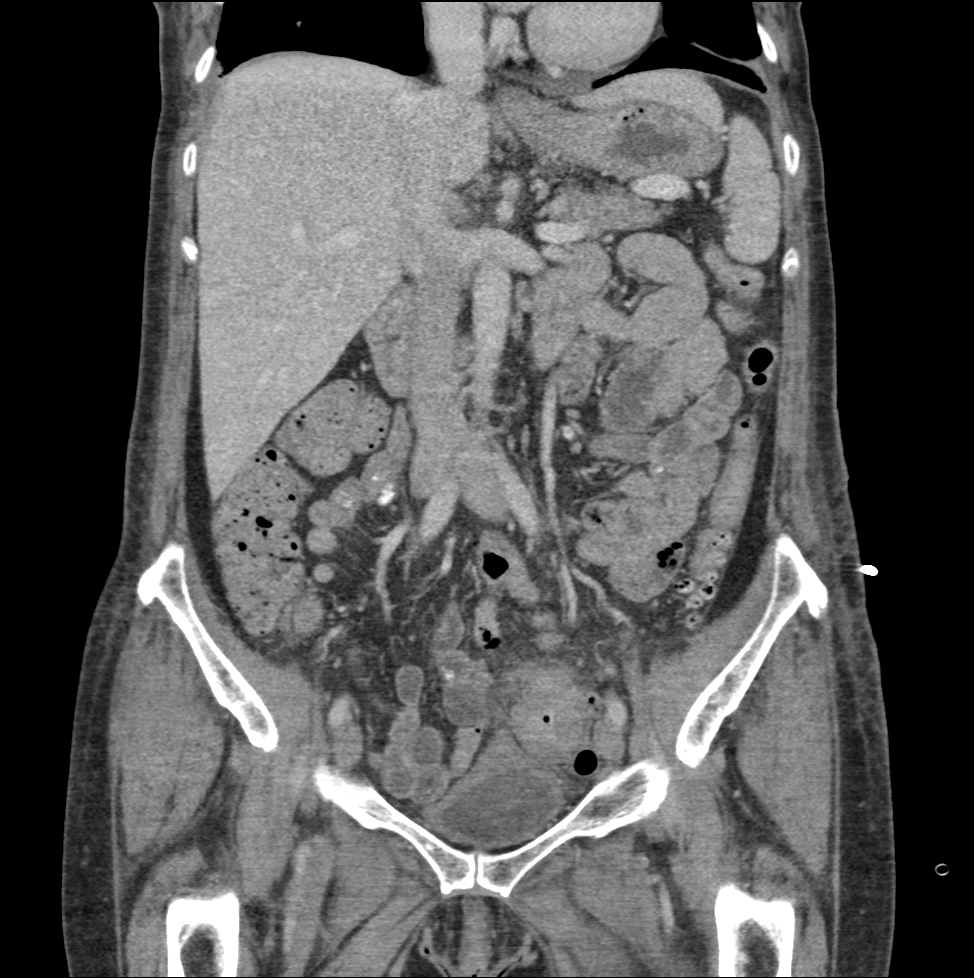
[im 54/98  soft-tissue]
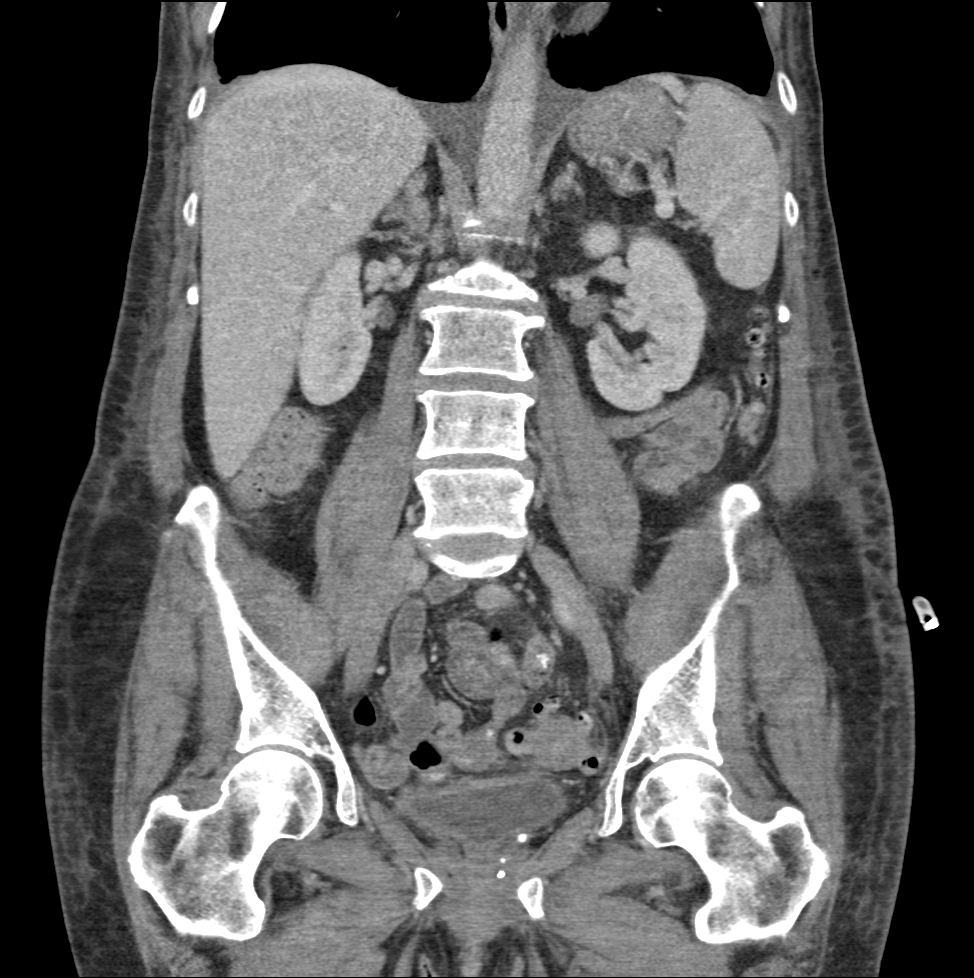

[11 of 46 positions shown; findings below may reference images not displayed]

FINDINGS: Lower chest: There are bilateral small pleural effusions with
associated bibasilar atelectasis.

Hepatobiliary: No masses or other significant abnormality.
Gallbladder is normal.

Pancreas: No mass, inflammatory changes, or other significant
abnormality.

Spleen: Within normal limits in size and appearance.

Adrenals/Urinary Tract: No masses identified. No evidence of
hydronephrosis. Left renal cysts, the larger measuring 13 mm, noted.

Stomach/Bowel: A pigtail catheter has been placed within the known
left pericolonic abscess with interval decrease in the size of the
abscess now measuring 3.4 x 2.7 x 4.6 cm. Extensive colonic
diverticulosis is again seen. No evidence of obstruction.

Vascular/Lymphatic: No pathologically enlarged lymph nodes. No
evidence of abdominal aortic aneurysm.

Reproductive: Post hysterectomy.  No suspicious findings.

Other: Persistent mild circumferential thickening of the urinary
bladder wall.

Musculoskeletal:  No suspicious bone lesions identified.
IMPRESSION: Interval mild decrease in the size of left pelvic pericolonic
abscess, now measuring 3.4 x 2.7 x 4.6 cm, with appropriate
positioning of the pigtail draining catheter.

Extensive left colonic diverticulosis.

Interval development of bilateral small pleural effusions and
subsegmental atelectasis.

## 2018-04-17 ENCOUNTER — Encounter (HOSPITAL_COMMUNITY): Payer: Self-pay

## 2018-04-17 ENCOUNTER — Emergency Department (HOSPITAL_COMMUNITY)
Admission: EM | Admit: 2018-04-17 | Discharge: 2018-04-17 | Disposition: A | Discharge status: Home / Self Care | Payer: Medicare HMO | Attending: Emergency Medicine | Admitting: Emergency Medicine

## 2018-04-17 ENCOUNTER — Other Ambulatory Visit: Payer: Self-pay

## 2018-04-17 DIAGNOSIS — Z8673 Personal history of transient ischemic attack (TIA), and cerebral infarction without residual deficits: Secondary | ICD-10-CM | POA: Insufficient documentation

## 2018-04-17 DIAGNOSIS — S060X1A Concussion with loss of consciousness of 30 minutes or less, initial encounter: Secondary | ICD-10-CM | POA: Diagnosis not present

## 2018-04-17 DIAGNOSIS — Z79899 Other long term (current) drug therapy: Secondary | ICD-10-CM | POA: Insufficient documentation

## 2018-04-17 DIAGNOSIS — Y998 Other external cause status: Secondary | ICD-10-CM | POA: Insufficient documentation

## 2018-04-17 DIAGNOSIS — W010XXA Fall on same level from slipping, tripping and stumbling without subsequent striking against object, initial encounter: Secondary | ICD-10-CM | POA: Insufficient documentation

## 2018-04-17 DIAGNOSIS — S0990XA Unspecified injury of head, initial encounter: Secondary | ICD-10-CM

## 2018-04-17 DIAGNOSIS — I428 Other cardiomyopathies: Secondary | ICD-10-CM | POA: Insufficient documentation

## 2018-04-17 DIAGNOSIS — F17210 Nicotine dependence, cigarettes, uncomplicated: Secondary | ICD-10-CM | POA: Diagnosis not present

## 2018-04-17 DIAGNOSIS — E785 Hyperlipidemia, unspecified: Secondary | ICD-10-CM | POA: Insufficient documentation

## 2018-04-17 DIAGNOSIS — Y929 Unspecified place or not applicable: Secondary | ICD-10-CM | POA: Diagnosis not present

## 2018-04-17 DIAGNOSIS — Y939 Activity, unspecified: Secondary | ICD-10-CM | POA: Insufficient documentation

## 2018-04-17 MED ORDER — TRAMADOL HCL 50 MG PO TABS: 50.0000 mg | ORAL_TABLET | Freq: Four times a day (QID) | ORAL | 0 refills | Status: DC | PRN

## 2018-04-17 MED ORDER — ONDANSETRON 4 MG PO TBDP: 4.0000 mg | ORAL_TABLET | Freq: Three times a day (TID) | ORAL | 2 refills | Status: DC | PRN

## 2018-04-17 NOTE — ED Provider Notes (Signed)
[facility] DEPT Provider Note   CSN: [id number] Arrival date & time: 04/17/18  1533     History   Chief Complaint Chief Complaint  Patient presents with  . Head Injury    HPI Dorothy White is a 67 y.o. female.  Patient status post fall on Tuesday.  Tripped over a wet rug at home and fell and hit her forehead on the left side resulting in a laceration.  Patient had a brief period of loss of consciousness.  Patient was days significantly immediately after the fall.  [facility] [address].  The wound was repaired with 6 sutures.  And patient was referred for an outpatient head CT which was done yesterday.  Patient not able to get results.  Patient has had persistent headache dizziness and nausea.  She is worried about a significant injury.  She thinks she has a concussion.  Patient denies any other injuries related to the fall.     Past Medical History:  Diagnosis Date  . Anemia    ?  Marland Kitchen Anxiety   . Arthritis    "everywhere" (04/25/2016)  . 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 Chronic back pain    "all over" (04/25/2016)  . Constipation   . Depression   . Diverticulitis   . GERD (gastroesophageal reflux disease)   . Headache    "weekly" (04/25/2016)  . 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 HLD (hyperlipidemia)    hx (04/25/2016)  . 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 Mild CAD    a. 25% mLAD, otherwise no sig disease 01/2016 cath.  Marland Kitchen NICM (nonischemic cardiomyopathy) ([place])    a. EF 40-45% by echo 08/4266 at time of complicated migraine/neuro sx, 55-65% at time of cath 01/2016  . Stroke [facility]) 06/2013   "mini" stroke , ?possibly hemaplegic migraine per pt    Patient Active Problem List   Diagnosis Date Noted  . Recurrent falls 04/16/2017  . 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 At risk for adverse drug event 04/07/2017  . Acute blood  loss as cause of postoperative anemia 04/04/2017  . Acute delirium 04/04/2017  . Osteoporosis 04/02/2017  . Closed fracture of neck of left femur ([place]) 03/31/2017  . Scalp contusion 03/31/2017  . Fall at home, initial encounter 03/31/2017  . Hip fracture ([place]) 03/31/2017  . 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 Hx of transient ischemic attack (TIA) 02/19/2017  . Left carotid stenosis- 40-59% 02/19/2017  . Malnutrition of moderate degree 02/18/2017  . Falls 02/17/2017  . Chest pain 09/26/2016  . Diverticulitis of sigmoid colon 04/25/2016  . Infection due to ESBL-producing Escherichia coli/Diverticular Abscess 03/15/2016  . Diverticulitis 03/12/2016  . Chronic pain syndrome 03/12/2016  . Lesion of pancreas 03/12/2016  . History of chest pain 03/12/2016  . Hypokalemia 03/12/2016  . Angina, class IV ([place]) - chest tightness and pressure with dyspnea with minimal exertion 02/17/2016    Class: Question of  . TIA (transient ischemic attack) 12/26/2015  . DOE (dyspnea on exertion) 12/26/2015  . Right sided weakness 12/17/2015  . Ataxia 12/17/2015  . Shoulder fracture 02/15/2015  . Chronic headache 10/11/2013  . Weakness generalized 08/12/2013  . Dizziness and giddiness 08/12/2013  . Abnormality of gait 07/11/2013  . Hyperlipidemia 05/07/2007  . Migraine without aura 05/07/2007  . PREMATURE VENTRICULAR CONTRACTIONS, FREQUENT 05/07/2007  . GERD 05/07/2007  . DIVERTICULOSIS, COLON  05/07/2007  . 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 OSTEOPENIA 05/07/2007  . Anxiety and depression 03/24/2007  . HEMORRHOIDS 03/24/2007  . ALLERGIC RHINITIS 03/24/2007  . LOW BACK PAIN 03/24/2007  . MIGRAINES, HX OF 03/24/2007    Past Surgical History:  Procedure Laterality Date  . ANTERIOR APPROACH HEMI HIP ARTHROPLASTY Left 04/01/2017   Procedure: LEFT DIRECT ANTERIOR TOTAL HIP REPLACEMENT;  Surgeon: Leandrew Koyanagi, MD;  Location: [facility];  Service: Orthopedics;  Laterality: Left;  LEFT DIRECT ANTERIOR  TOTAL HIP REPLACEMENT  . AUGMENTATION MAMMAPLASTY  1980  . BASAL CELL CARCINOMA EXCISION     "tip of my nose"  . BUNIONECTOMY Bilateral 10/2003  . CARDIAC CATHETERIZATION N/A 02/20/2016   Procedure: Left Heart Cath and Coronary Angiography;  Surgeon: Jettie Booze, MD;  Location: [facility];  Service: Cardiovascular;  Laterality: N/A;  . COLONOSCOPY    . DILATION AND CURETTAGE OF UTERUS    . FRACTURE SURGERY    . IR GENERIC HISTORICAL  03/18/2016   IR SINUS/FIST TUBE CHK-NON GI 03/18/2016 Aletta Edouard, MD [facility]-INTERV RAD  . LAPAROSCOPIC SIGMOID COLECTOMY N/A 04/25/2016   Procedure: LAPAROSCOPIC SIGMOID COLECTOMY;  Surgeon: Stark Klein, MD;  Location: [facility];  Service: General;  Laterality: N/A;  . OOPHORECTOMY Bilateral ~ 1999  . ORIF HUMERUS FRACTURE Right 02/15/2015   Procedure: OPEN REDUCTION INTERNAL FIXATION (ORIF) PROXIMAL HUMERUS FRACTURE;  Surgeon: Netta Cedars, MD;  Location: [facility];  Service: Orthopedics;  Laterality: Right;  . RHINOPLASTY  1976  . TONSILLECTOMY    . TUBAL LIGATION  ~ 1983  . VAGINAL HYSTERECTOMY  ~ 1998     OB History    Gravida  0   Para  0   Term  0   Preterm  0   AB  0   Living  0     SAB  0   TAB  0   Ectopic  0   Multiple  0   Live Births  0            Home Medications    Prior to Admission medications   Medication Sig Start Date End Date Taking? Authorizing Provider  clonazePAM (KLONOPIN) 1 MG tablet Take 1 mg by mouth 2 (two) times daily as needed for anxiety.     [provider]  FLUoxetine (PROZAC) 20 MG tablet Take 3 tablets (60 mg total) by mouth every morning. Take 3 tablets to = 60 mg qam Patient taking differently: Take 80 mg by mouth every morning. Take 3 tablets to = 60 mg qam  04/22/17   Medina-Vargas, Monina C, NP  gabapentin (NEURONTIN) 100 MG capsule Take 100 mg by mouth as needed.  01/23/18   [provider]  ibuprofen (ADVIL,MOTRIN) 600 MG tablet Take 1 tablet (600 mg total) by mouth  every 6 (six) hours as needed. 05/10/17   Shary Decamp, PA-C  LUTEIN PO Take 1 capsule by mouth daily.     [provider]  Magnesium 500 MG TABS Take 500 mg by mouth at bedtime.    [provider]  meloxicam (MOBIC) 7.5 MG tablet Take 2 tablets (15 mg total) by mouth daily. Patient not taking: Reported on 02/25/2018 02/13/18   Noemi Chapel, MD  methocarbamol (ROBAXIN) 500 MG tablet Take 1 tablet (500 mg total) by mouth 2 (two) times daily. Patient not taking: Reported on 02/25/2018 01/15/18   Lacretia Leigh, MD  mometasone (NASONEX) 50 MCG/ACT nasal spray Place 2 sprays into  the nose daily. 04/22/17   Medina-Vargas, Monina C, NP  omeprazole (PRILOSEC) 40 MG capsule Take 40 mg by mouth daily. 06/22/17   [provider]  ondansetron (ZOFRAN ODT) 4 MG disintegrating tablet Take 1 tablet (4 mg total) by mouth every 8 (eight) hours as needed for nausea or vomiting. 04/17/18   Fredia Sorrow, MD  ondansetron (ZOFRAN) 8 MG tablet Take 1 tablet (8 mg total) by mouth every 8 (eight) hours as needed for nausea or vomiting. 04/22/17   Medina-Vargas, Monina C, NP  SUMAtriptan (IMITREX) 100 MG tablet TAKE 1/2 TO 1 TABLET BY MOUTH TWICE DAILY AS NEEDED FOR MIGRAINE 12/29/17   [provider]  tiZANidine (ZANAFLEX) 4 MG tablet Take 4 mg by mouth 3 (three) times daily as needed for muscle spasms.  12/28/17   [provider]  topiramate (TOPAMAX) 100 MG tablet Take 1 tablet (100 mg total) by mouth at bedtime. 12/28/17   Kathrynn Ducking, MD  traMADol (ULTRAM) 50 MG tablet Take 1 tablet (50 mg total) by mouth every 6 (six) hours as needed. 04/17/18   Fredia Sorrow, MD  trazodone (DESYREL) 300 MG tablet Take 1 tablet (300 mg total) by mouth at bedtime as needed for sleep. Patient taking differently: Take 300 mg by mouth at bedtime as needed for sleep. sleep 04/22/17   Medina-Vargas, Monina C, NP  vitamin B-12 (CYANOCOBALAMIN) 1000 MCG tablet Take 1 tablet (1,000 mcg total) by mouth  daily. 02/19/17   Debbe Odea, MD    Family History Family History  Problem Relation Age of Onset  . Lung cancer Mother 70  . Migraines Mother   . Heart attack Father        Vague  . Hypertension Brother   . Esophageal cancer Brother   . Diabetes Paternal Grandmother        questionable  . Colon cancer Neg Hx   . Kidney disease Neg Hx   . Liver disease Neg Hx     Social History Social History   Tobacco Use  . Smoking status: Current Some Day Smoker    Packs/day: 1.00    Years: 34.00    Pack years: 34.00    Types: Cigarettes    Last attempt to quit: 02/03/2017    Years since quitting: 1.2  . Smokeless tobacco: Never Used  . Tobacco comment: 10 per week or less  Substance Use Topics  . Alcohol use: Yes    Alcohol/week: 1.0 standard drinks    Types: 1 Standard drinks or equivalent per week    Comment: occ   . Drug use: No    Types: Cocaine    Comment: Denies any drug use 02/19/18     Allergies   Opana [oxymorphone hcl] and Penicillins   Review of Systems Review of Systems  Constitutional: Negative for diaphoresis and fever.  HENT: Negative for congestion.   Eyes: Negative for redness and visual disturbance.  Respiratory: Negative for shortness of breath.   Cardiovascular: Negative for chest pain and leg swelling.  Gastrointestinal: Positive for nausea. Negative for abdominal pain and vomiting.  Genitourinary: Negative for dysuria.  Musculoskeletal: Negative for back pain and neck pain.  Skin: Positive for wound.  Neurological: Positive for dizziness and headaches. Negative for weakness and numbness.  Hematological: Does not bruise/bleed easily.  Psychiatric/Behavioral: Positive for decreased concentration. Negative for confusion.     Physical Exam Updated Vital Signs BP (!) 131/49 (BP Location: Left Arm)   Pulse 66   Resp 16  Ht 1.676 m (5\' 6" )   Wt 51.7 kg   SpO2 97%   BMI 18.40 kg/m   Physical Exam  Constitutional: She is oriented to person,  place, and time. She appears well-developed and well-nourished. No distress.  HENT:  Head: Normocephalic.  Mouth/Throat: Oropharynx is clear and moist.  Bandage over the left forehead area.  No black eye to the left eye.  Eyes: Pupils are equal, round, and reactive to light. Conjunctivae and EOM are normal.  Neck: Neck supple.  Cardiovascular: Normal rate, regular rhythm and normal heart sounds.  Pulmonary/Chest: Effort normal and breath sounds normal. No respiratory distress.  Abdominal: Soft. Bowel sounds are normal. There is no tenderness.  Musculoskeletal: Normal range of motion. She exhibits no edema, tenderness or deformity.  Neurological: She is alert and oriented to person, place, and time. No cranial nerve deficit or sensory deficit. She exhibits normal muscle tone. Coordination normal.  Skin: Skin is warm. Capillary refill takes less than 2 seconds. No rash noted.  Nursing note and vitals reviewed.    ED Treatments / Results  Labs (all labs ordered are listed, but only abnormal results are displayed) Labs Reviewed - No data to display  EKG None  Radiology No results found.  Procedures Procedures (including critical care time)  Medications Ordered in ED Medications - No data to display   Initial Impression / Assessment and Plan / ED Course  I have reviewed the triage vital signs and the nursing notes.  Pertinent labs & imaging results that were available during my care of the patient were reviewed by me and considered in my medical decision making (see chart for details).     Patient status post fall on Tuesday tripped over a wet rug fell forward hitting the left forehead area resulting in a laceration.  Patient was dazed and feels that she had a brief period of loss of consciousness.  [facility] to [facility]  They put in 6 sutures into the laceration.  Patient has a bandage over that.  They also sent her to have a head CT which was done yesterday.   Patient does not know the results she tried to find the results and could not get them.  Was able to review the head CT report through care everywhere.  There was no evidence of any acute injury.  No skull fracture no brain injury.  Patient's had persistent headache dizziness and nausea since the fall.  These things are consistent with a concussion.  Patient has no other significant neuro deficits.  Patient also believes she is had difficulty concentrating.  Patient denies any other injuries related to fall other than the laceration to the forehead and the head injury.  Patient stable for discharge home and follow-up with primary care doctor.  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  Patient will be treated with Zofran and tramadol.  Patient does take Motrin at home as well.  Patient does not need a work note.  Final Clinical Impressions(s) / ED Diagnoses   Final diagnoses:  Injury of head, initial encounter  Concussion with loss of consciousness of 30 minutes or less, initial encounter    ED Discharge Orders         Ordered    ondansetron (ZOFRAN ODT) 4 MG disintegrating tablet  Every 8 hours PRN     04/17/18 1642    traMADol (ULTRAM) 50 MG tablet  Every 6 hours PRN     04/17/18 1642  Fredia Sorrow, MD 04/17/18 [phone]

## 2018-04-17 NOTE — ED Triage Notes (Signed)
Pt reports head pain after a fall earlier this week. She reports that she was seen at [facility] and received stiches. They sent her to [facility] for a CT, but she reports that she never received the results. She endorses continues nausea, dizziness when standing and head pain. She is concerned about a concussion. A&Ox4. Ambulatory.

## 2018-04-17 NOTE — Discharge Instructions (Signed)
Take the Zofran as needed for nausea.  Take the tramadol as needed for the headaches.  Make an appointment to follow-up with your regular doctor.  As we discussed was able to review the formal report of your head CT from September 20.  No acute injury noted.  This does not rule out the fact that you could have a concussion.  Based on your symptoms her symptoms are consistent with a concussion.  Recommend brain rest.

## 2018-04-18 ENCOUNTER — Encounter (HOSPITAL_COMMUNITY): Payer: Self-pay | Admitting: Emergency Medicine

## 2018-04-18 DIAGNOSIS — R296 Repeated falls: Secondary | ICD-10-CM | POA: Insufficient documentation

## 2018-04-18 DIAGNOSIS — Z79899 Other long term (current) drug therapy: Secondary | ICD-10-CM | POA: Diagnosis not present

## 2018-04-18 DIAGNOSIS — I2510 Atherosclerotic heart disease of native coronary artery without angina pectoris: Secondary | ICD-10-CM | POA: Diagnosis not present

## 2018-04-18 DIAGNOSIS — Z87891 Personal history of nicotine dependence: Secondary | ICD-10-CM | POA: Insufficient documentation

## 2018-04-18 DIAGNOSIS — M25552 Pain in left hip: Secondary | ICD-10-CM | POA: Insufficient documentation

## 2018-04-18 DIAGNOSIS — I951 Orthostatic hypotension: Secondary | ICD-10-CM | POA: Insufficient documentation

## 2018-04-18 DIAGNOSIS — I959 Hypotension, unspecified: Secondary | ICD-10-CM | POA: Diagnosis not present

## 2018-04-18 DIAGNOSIS — Z7982 Long term (current) use of aspirin: Secondary | ICD-10-CM | POA: Diagnosis not present

## 2018-04-18 DIAGNOSIS — S8992XA Unspecified injury of left lower leg, initial encounter: Secondary | ICD-10-CM | POA: Diagnosis not present

## 2018-04-18 DIAGNOSIS — R51 Headache: Secondary | ICD-10-CM | POA: Diagnosis present

## 2018-04-18 DIAGNOSIS — M25551 Pain in right hip: Secondary | ICD-10-CM | POA: Diagnosis not present

## 2018-04-18 DIAGNOSIS — M25562 Pain in left knee: Secondary | ICD-10-CM | POA: Diagnosis not present

## 2018-04-18 DIAGNOSIS — S79912A Unspecified injury of left hip, initial encounter: Secondary | ICD-10-CM | POA: Diagnosis not present

## 2018-04-18 DIAGNOSIS — S79911A Unspecified injury of right hip, initial encounter: Secondary | ICD-10-CM | POA: Diagnosis not present

## 2018-04-18 NOTE — ED Notes (Signed)
Pt refuses wheelchair and wants to use walker.

## 2018-04-18 NOTE — ED Triage Notes (Signed)
Pt c/o frequent falls, states she fell again today several times and injured head. Pt was treated yesterday for fall and head injury. Pt's pain is R lower head and R upper neck. Pt states she is unsure why she is falling. Pt using walker to ambulate.

## 2018-04-19 ENCOUNTER — Emergency Department (HOSPITAL_COMMUNITY): Payer: Medicare HMO

## 2018-04-19 ENCOUNTER — Emergency Department (HOSPITAL_COMMUNITY)
Admission: EM | Admit: 2018-04-19 | Discharge: 2018-04-19 | Disposition: A | Discharge status: Home / Self Care | Payer: Medicare HMO | Attending: Emergency Medicine | Admitting: Emergency Medicine

## 2018-04-19 ENCOUNTER — Ambulatory Visit: Payer: Self-pay

## 2018-04-19 DIAGNOSIS — W19XXXA Unspecified fall, initial encounter: Secondary | ICD-10-CM

## 2018-04-19 DIAGNOSIS — S79912A Unspecified injury of left hip, initial encounter: Secondary | ICD-10-CM | POA: Diagnosis not present

## 2018-04-19 DIAGNOSIS — R296 Repeated falls: Secondary | ICD-10-CM

## 2018-04-19 DIAGNOSIS — S8992XA Unspecified injury of left lower leg, initial encounter: Secondary | ICD-10-CM | POA: Diagnosis not present

## 2018-04-19 DIAGNOSIS — M25552 Pain in left hip: Secondary | ICD-10-CM

## 2018-04-19 DIAGNOSIS — M25562 Pain in left knee: Secondary | ICD-10-CM | POA: Diagnosis not present

## 2018-04-19 DIAGNOSIS — M25551 Pain in right hip: Secondary | ICD-10-CM

## 2018-04-19 DIAGNOSIS — S79911A Unspecified injury of right hip, initial encounter: Secondary | ICD-10-CM | POA: Diagnosis not present

## 2018-04-19 DIAGNOSIS — I951 Orthostatic hypotension: Secondary | ICD-10-CM

## 2018-04-19 LAB — CBC WITH DIFFERENTIAL/PLATELET
BASOS ABS: 0 10*3/uL (ref 0.0–0.1)
BASOS PCT: 0 %
EOS ABS: 0.2 10*3/uL (ref 0.0–0.7)
EOS PCT: 4 %
HCT: 40.6 % (ref 36.0–46.0)
Hemoglobin: 12.5 g/dL (ref 12.0–15.0)
LYMPHS PCT: 26 %
Lymphs Abs: 1.4 10*3/uL (ref 0.7–4.0)
MCH: 27.4 pg (ref 26.0–34.0)
MCHC: 30.8 g/dL (ref 30.0–36.0)
MCV: 88.8 fL (ref 78.0–100.0)
MONO ABS: 0.5 10*3/uL (ref 0.1–1.0)
Monocytes Relative: 8 %
Neutro Abs: 3.3 10*3/uL (ref 1.7–7.7)
Neutrophils Relative %: 62 %
PLATELETS: 234 10*3/uL (ref 150–400)
RBC: 4.57 MIL/uL (ref 3.87–5.11)
RDW: 14.4 % (ref 11.5–15.5)
WBC: 5.3 10*3/uL (ref 4.0–10.5)

## 2018-04-19 LAB — BASIC METABOLIC PANEL
ANION GAP: 8 (ref 5–15)
BUN: 12 mg/dL (ref 8–23)
CALCIUM: 9.3 mg/dL (ref 8.9–10.3)
CHLORIDE: 108 mmol/L (ref 98–111)
CO2: 23 mmol/L (ref 22–32)
Creatinine, Ser: 0.76 mg/dL (ref 0.44–1.00)
GFR calc non Af Amer: >60 mL/min (ref >60)
Glucose, Bld: 89 mg/dL (ref 70–99)
POTASSIUM: 3.8 mmol/L (ref 3.5–5.1)
SODIUM: 139 mmol/L (ref 135–145)

## 2018-04-19 MED ORDER — ACETAMINOPHEN 500 MG PO TABS
1000.0000 mg | ORAL_TABLET | Freq: Once | ORAL | Status: AC
  Administered 2018-04-19: 1000 mg via ORAL
  Filled 2018-04-19: qty 2

## 2018-04-19 MED ORDER — SODIUM CHLORIDE 0.9 % IV BOLUS
1000.0000 mL | Freq: Once | INTRAVENOUS | Status: AC
  Administered 2018-04-19: 1000 mL via INTRAVENOUS

## 2018-04-19 NOTE — Discharge Instructions (Addendum)
We have consulted case management with assistance with home health.  Please use your walker or cane at all times.  Ensure that you hydrate.

## 2018-04-19 NOTE — ED Notes (Signed)
Patient offered water. Cup of water is at bedside for patient, if needed/wanted.

## 2018-04-19 NOTE — ED Notes (Signed)
EDP made aware of orthostatic vital signs.

## 2018-04-19 NOTE — ED Provider Notes (Signed)
  Physical Exam  BP 110/71   Pulse (!) 58   Temp 97.9 F (36.6 C) (Oral)   Resp 16   SpO2 96%   Physical Exam  ED Course/Procedures     Procedures  MDM   Pt comes in with cc of falls. Pt has hx of stroke and frequent falls hx. She had reassuring workup in the ER. She has no home help - and Case management consulted to see if the patient can get further home assistance.       Varney Biles, MD 04/19/18 [phone]

## 2018-04-19 NOTE — ED Provider Notes (Signed)
[place] DEPT Provider Note  CSN: [id number] Arrival date & time: 04/18/18 3825  Chief Complaint(s) Fall and Headache  HPI Dorothy White is a 67 y.o. female extensive past medical history listed below including CVA resulting in gait instability which has resulted in frequent falls.  Patient returns today for another fall that occurred just prior to arrival.  She reports that she had just gotten up and started walking without her cane or walker.  She felt dizzy and had vertiginous symptoms causing her to fall backwards onto a brick wall scraping the right side of her neck.  Patient fell onto her buttock causing bilateral hip pain.  Patient also complaining of left knee pain.  Pain is exacerbated palpation and movement.  She denies any loss of consciousness.  She is endorsing mild headache without nausea.  No focal deficits.  No chest pain or back pain.  No abdominal pain.  HPI  Past Medical History Past Medical History:  Diagnosis Date  . Anemia    ?  Marland Kitchen Anxiety   . Arthritis    "everywhere" (04/25/2016)  . 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 Chronic back pain    "all over" (04/25/2016)  . Constipation   . Depression   . Diverticulitis   . GERD (gastroesophageal reflux disease)   . Headache    "weekly" (04/25/2016)  . 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 HLD (hyperlipidemia)    hx (04/25/2016)  . 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 Mild CAD    a. 25% mLAD, otherwise no sig disease 01/2016 cath.  Marland Kitchen NICM (nonischemic cardiomyopathy) ([place])    a. EF 40-45% by echo 0/5397 at time of complicated migraine/neuro sx, 55-65% at time of cath 01/2016  . Stroke [facility]) 06/2013   "mini" stroke , ?possibly hemaplegic migraine per pt   Patient Active Problem List   Diagnosis Date Noted  . Recurrent falls 04/16/2017  . 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 At risk for adverse drug  event 04/07/2017  . Acute blood loss as cause of postoperative anemia 04/04/2017  . Acute delirium 04/04/2017  . Osteoporosis 04/02/2017  . Closed fracture of neck of left femur ([place]) 03/31/2017  . Scalp contusion 03/31/2017  . Fall at home, initial encounter 03/31/2017  . Hip fracture ([place]) 03/31/2017  . 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 Hx of transient ischemic attack (TIA) 02/19/2017  . Left carotid stenosis- 40-59% 02/19/2017  . Malnutrition of moderate degree 02/18/2017  . Falls 02/17/2017  . Chest pain 09/26/2016  . Diverticulitis of sigmoid colon 04/25/2016  . Infection due to ESBL-producing Escherichia coli/Diverticular Abscess 03/15/2016  . Diverticulitis 03/12/2016  . Chronic pain syndrome 03/12/2016  . Lesion of pancreas 03/12/2016  . History of chest pain 03/12/2016  . Hypokalemia 03/12/2016  . Angina, class IV ([place]) - chest tightness and pressure with dyspnea with minimal exertion 02/17/2016    Class: Question of  . TIA (transient ischemic attack) 12/26/2015  . DOE (dyspnea on exertion) 12/26/2015  . Right sided weakness 12/17/2015  . Ataxia 12/17/2015  . Shoulder fracture 02/15/2015  . Chronic headache 10/11/2013  . Weakness generalized 08/12/2013  . Dizziness and giddiness 08/12/2013  . Abnormality of gait 07/11/2013  . Hyperlipidemia 05/07/2007  . Migraine without aura 05/07/2007  . PREMATURE VENTRICULAR CONTRACTIONS, FREQUENT 05/07/2007  . GERD 05/07/2007  . DIVERTICULOSIS, COLON 05/07/2007  . 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  OSTEOPENIA 05/07/2007  . Anxiety and depression 03/24/2007  . HEMORRHOIDS 03/24/2007  . ALLERGIC RHINITIS 03/24/2007  . LOW BACK PAIN 03/24/2007  . MIGRAINES, HX OF 03/24/2007   Home Medication(s) Prior to Admission medications   Medication Sig Start Date End Date Taking? Authorizing Provider  aspirin 81 MG EC tablet Take 81 mg by mouth every other day. 04/02/18  Yes [provider]  [employer] D3 1000 units capsule Take  1,000 Units by mouth daily. 03/12/18  Yes [provider]  FLUoxetine (PROZAC) 20 MG tablet Take 3 tablets (60 mg total) by mouth every morning. Take 3 tablets to = 60 mg qam Patient taking differently: Take 80 mg by mouth every morning. Take 3 tablets to = 60 mg qam  04/22/17  Yes Medina-Vargas, Monina C, NP  gabapentin (NEURONTIN) 100 MG capsule Take 100 mg by mouth daily as needed (nerve pain).  01/23/18  Yes [provider]  LUTEIN PO Take 1 capsule by mouth daily.    Yes [provider]  Magnesium 500 MG TABS Take 500 mg by mouth at bedtime.   Yes [provider]  moxifloxacin (VIGAMOX) 0.5 % ophthalmic solution Place 1 drop into the left eye 4 (four) times daily. 04/02/18  Yes [provider]  omeprazole (PRILOSEC) 40 MG capsule Take 40 mg by mouth daily. 06/22/17  Yes [provider]  oxyCODONE-acetaminophen (PERCOCET) 10-325 MG tablet Take 1 tablet by mouth every 6 (six) hours as needed for pain.  03/24/18  Yes [provider]  SUMAtriptan (IMITREX) 100 MG tablet Take 50-100 mg by mouth every 2 (two) hours as needed for migraine or headache.  12/29/17  Yes [provider]  tiZANidine (ZANAFLEX) 4 MG tablet Take 4 mg by mouth 3 (three) times daily as needed for muscle spasms.  12/28/17  Yes [provider]  topiramate (TOPAMAX) 100 MG tablet Take 1 tablet (100 mg total) by mouth at bedtime. 12/28/17  Yes Kathrynn Ducking, MD  traMADol (ULTRAM) 50 MG tablet Take 1 tablet (50 mg total) by mouth every 6 (six) hours as needed. Patient taking differently: Take 50 mg by mouth every 6 (six) hours as needed for moderate pain or severe pain.  04/17/18  Yes Fredia Sorrow, MD  trazodone (DESYREL) 300 MG tablet Take 1 tablet (300 mg total) by mouth at bedtime as needed for sleep. Patient taking differently: Take 300 mg by mouth at bedtime as needed for sleep. sleep 04/22/17  Yes Medina-Vargas, Monina C, NP  vitamin B-12  (CYANOCOBALAMIN) 1000 MCG tablet Take 1 tablet (1,000 mcg total) by mouth daily. 02/19/17  Yes Debbe Odea, MD  vitamin E 100 UNIT capsule Take 100 Units by mouth daily. 03/15/18  Yes [provider]  zolpidem (AMBIEN) 10 MG tablet Take 10 mg by mouth at bedtime as needed for sleep.  03/25/18  Yes [provider]  meloxicam (MOBIC) 7.5 MG tablet Take 2 tablets (15 mg total) by mouth daily. Patient not taking: Reported on 02/25/2018 02/13/18   Noemi Chapel, MD  methocarbamol (ROBAXIN) 500 MG tablet Take 1 tablet (500 mg total) by mouth 2 (two) times daily. Patient not taking: Reported on 02/25/2018 01/15/18   Lacretia Leigh, MD  mometasone (NASONEX) 50 MCG/ACT nasal spray Place 2 sprays into the nose daily. Patient not taking: Reported on 04/19/2018 04/22/17   Medina-Vargas, Monina C, NP  ondansetron (ZOFRAN ODT) 4 MG disintegrating tablet Take 1 tablet (4 mg total) by mouth every 8 (eight) hours as needed  for nausea or vomiting. Patient not taking: Reported on 04/19/2018 04/17/18   Fredia Sorrow, MD  ondansetron (ZOFRAN) 8 MG tablet Take 1 tablet (8 mg total) by mouth every 8 (eight) hours as needed for nausea or vomiting. Patient not taking: Reported on 04/19/2018 04/22/17   Medina-Vargas, Senaida Lange, NP                                                                                                                                    Past Surgical History Past Surgical History:  Procedure Laterality Date  . ANTERIOR APPROACH HEMI HIP ARTHROPLASTY Left 04/01/2017   Procedure: LEFT DIRECT ANTERIOR TOTAL HIP REPLACEMENT;  Surgeon: Leandrew Koyanagi, MD;  Location: [place] [facility];  Service: Orthopedics;  Laterality: Left;  LEFT DIRECT ANTERIOR TOTAL HIP REPLACEMENT  . AUGMENTATION MAMMAPLASTY  1980  . BASAL CELL CARCINOMA EXCISION     "tip of my nose"  . BUNIONECTOMY Bilateral 10/2003  . CARDIAC CATHETERIZATION N/A 02/20/2016   Procedure: Left Heart Cath and Coronary Angiography;  Surgeon: Jettie Booze, MD;  Location: [place] [facility];  Service: Cardiovascular;  Laterality: N/A;  . COLONOSCOPY    . DILATION AND CURETTAGE OF UTERUS    . FRACTURE SURGERY    . IR GENERIC HISTORICAL  03/18/2016   IR SINUS/FIST TUBE CHK-NON GI 03/18/2016 Aletta Edouard, MD [facility]-INTERV RAD  . LAPAROSCOPIC SIGMOID COLECTOMY N/A 04/25/2016   Procedure: LAPAROSCOPIC SIGMOID COLECTOMY;  Surgeon: Stark Klein, MD;  Location: [place];  Service: General;  Laterality: N/A;  . OOPHORECTOMY Bilateral ~ 1999  . ORIF HUMERUS FRACTURE Right 02/15/2015   Procedure: OPEN REDUCTION INTERNAL FIXATION (ORIF) PROXIMAL HUMERUS FRACTURE;  Surgeon: Netta Cedars, MD;  Location: [place];  Service: Orthopedics;  Laterality: Right;  . RHINOPLASTY  1976  . TONSILLECTOMY    . TUBAL LIGATION  ~ 1983  . VAGINAL HYSTERECTOMY  ~ 1998   Family History Family History  Problem Relation Age of Onset  . Lung cancer Mother 25  . Migraines Mother   . Heart attack Father        Vague  . Hypertension Brother   . Esophageal cancer Brother   . Diabetes Paternal Grandmother        questionable  . Colon cancer Neg Hx   . Kidney disease Neg Hx   . Liver disease Neg Hx     Social History Social History   Tobacco Use  . Smoking status: Current Some Day Smoker    Packs/day: 1.00    Years: 34.00    Pack years: 34.00    Types: Cigarettes    Last attempt to quit: 02/03/2017    Years since quitting: 1.2  . Smokeless tobacco: Never Used  . Tobacco comment: 10 per week or less  Substance Use Topics  . Alcohol use: Yes    Alcohol/week: 1.0 standard drinks    Types: 1 Standard drinks or  equivalent per week    Comment: occ   . Drug use: No    Types: Cocaine    Comment: Denies any drug use 02/19/18   Allergies Opana [oxymorphone hcl] and Penicillins  Review of Systems Review of Systems All other systems are reviewed and are negative for acute change except as noted in the HPI  Physical Exam Vital Signs  I have reviewed the triage  vital signs BP 132/69   Pulse (!) 59   Temp 97.9 F (36.6 C) (Oral)   Resp 16   SpO2 95%   Physical Exam  Constitutional: She is oriented to person, place, and time. She appears well-developed and well-nourished. No distress.  HENT:  Head: Normocephalic and atraumatic.  Right Ear: External ear normal.  Left Ear: External ear normal.  Nose: Nose normal.  Eyes: Pupils are equal, round, and reactive to light. Conjunctivae and EOM are normal. Right eye exhibits no discharge. Left eye exhibits no discharge. No scleral icterus.  Neck: Normal range of motion. Neck supple. No spinous process tenderness and no muscular tenderness present. Normal range of motion present.    Cardiovascular: Normal rate, regular rhythm and normal heart sounds. Exam reveals no gallop and no friction rub.  No murmur heard. Pulses:      Radial pulses are 2+ on the right side, and 2+ on the left side.       Dorsalis pedis pulses are 2+ on the right side, and 2+ on the left side.  Pulmonary/Chest: Effort normal and breath sounds normal. No stridor. No respiratory distress. She has no wheezes.  Abdominal: Soft. She exhibits no distension. There is no tenderness.  Musculoskeletal: She exhibits no edema.       Right hip: She exhibits tenderness.       Left hip: She exhibits tenderness.       Left knee: Tenderness found.       Cervical back: She exhibits no bony tenderness.       Thoracic back: She exhibits no bony tenderness.       Lumbar back: She exhibits no bony tenderness.  Clavicles stable. Chest stable to AP/Lat compression. Pelvis stable to Lat compression. No obvious extremity deformity. No chest or abdominal wall contusion.  Neurological: She is alert and oriented to person, place, and time.  Moving all extremities  Skin: Skin is warm and dry. No rash noted. She is not diaphoretic. No erythema.  Psychiatric: She has a normal mood and affect.    ED Results and Treatments Labs (all labs ordered are  listed, but only abnormal results are displayed) Labs Reviewed  CBC WITH DIFFERENTIAL/PLATELET  BASIC METABOLIC PANEL                                                                                                                         EKG  EKG Interpretation  Date/Time:    Ventricular Rate:    PR Interval:    QRS Duration:   QT Interval:  QTC Calculation:   R Axis:     Text Interpretation:        Radiology Dg Knee Complete 4 Views Left  Result Date: 04/19/2018 CLINICAL DATA:  67 year old female with fall. EXAM: LEFT KNEE - COMPLETE 4+ VIEW; DG HIP (WITH OR WITHOUT PELVIS) 2-3V RIGHT; DG HIP (WITH OR WITHOUT PELVIS) 2-3V LEFT COMPARISON:  Left hip radiograph dated 02/19/2018 FINDINGS: There is no acute fracture or dislocation. The bones are osteopenic. There is a total left hip arthroplasty which appears intact. Surgical suture noted over the pelvis. The soft tissues are grossly unremarkable. IMPRESSION: No acute fracture or dislocation. Electronically Signed   By: Anner Crete M.D.   On: 04/19/2018 03:50   Dg Hip Unilat W Or W/o Pelvis 2-3 Views Left  Result Date: 04/19/2018 CLINICAL DATA:  67 year old female with fall. EXAM: LEFT KNEE - COMPLETE 4+ VIEW; DG HIP (WITH OR WITHOUT PELVIS) 2-3V RIGHT; DG HIP (WITH OR WITHOUT PELVIS) 2-3V LEFT COMPARISON:  Left hip radiograph dated 02/19/2018 FINDINGS: There is no acute fracture or dislocation. The bones are osteopenic. There is a total left hip arthroplasty which appears intact. Surgical suture noted over the pelvis. The soft tissues are grossly unremarkable. IMPRESSION: No acute fracture or dislocation. Electronically Signed   By: Anner Crete M.D.   On: 04/19/2018 03:50   Dg Hip Unilat W Or W/o Pelvis 2-3 Views Right  Result Date: 04/19/2018 CLINICAL DATA:  67 year old female with fall. EXAM: LEFT KNEE - COMPLETE 4+ VIEW; DG HIP (WITH OR WITHOUT PELVIS) 2-3V RIGHT; DG HIP (WITH OR WITHOUT PELVIS) 2-3V LEFT COMPARISON:  Left  hip radiograph dated 02/19/2018 FINDINGS: There is no acute fracture or dislocation. The bones are osteopenic. There is a total left hip arthroplasty which appears intact. Surgical suture noted over the pelvis. The soft tissues are grossly unremarkable. IMPRESSION: No acute fracture or dislocation. Electronically Signed   By: Anner Crete M.D.   On: 04/19/2018 03:50   Pertinent labs & imaging results that were available during my care of the patient were reviewed by me and considered in my medical decision making (see chart for details).  Medications Ordered in ED Medications  acetaminophen (TYLENOL) tablet 1,000 mg (1,000 mg Oral Given 04/19/18 0338)  sodium chloride 0.9 % bolus 1,000 mL (0 mLs Intravenous Stopped 04/19/18 0717)                                                                                                                                    Procedures Procedures  (including critical care time)  Medical Decision Making / ED Course I have reviewed the nursing notes for this encounter and the patient's prior records (if available in EHR or on provided paperwork).    Patient here for fall in the setting of dizziness and vertiginous symptoms.  Patient complaining of neck pain which is associated to the abrasion on her neck.  No midline tenderness.  No focal deficits on exam.  Patient with bilateral hip pain but plain films negative.  Also with knee pain with negative plain film.  Orthostatics obtained and were grossly positive.  Provided with IV fluids.  Screening labs reassuring without significant electrolyte derangements, renal insufficiency, anemia.  Repeat orthostatics were reassuring.  Given her frequent and recurrent falls, will consult case management for home health and physical therapy.  Final Clinical Impression(s) / ED Diagnoses Final diagnoses:  Fall  Frequent falls  Acute pain of left knee  Pain of both hip joints  Orthostasis      This chart was  dictated using voice recognition software.  Despite best efforts to proofread,  errors can occur which can change the documentation meaning.   Fatima Blank, MD 04/19/18 [phone]

## 2018-04-19 NOTE — Care Management Note (Addendum)
Case Management Note  Patient Details  Name: Dorothy White MRN: [id number] Date of Birth: [birth year]  CM consulted for [facility] needs.  CM spoke with pt at bedside who advised she would like to use [facility], she has used them in the past.  CM discussed DME.  Pt has all needed DME except a wheelchair but there is no room in her apartment to use it.  CM contacted Santiago Glad with [facility] who accepted pt for services.  Information placed on AVS.  Updated Dr. Kathrynn Humble.  No further CM needs noted at this time.  Expected Discharge Date:   04/19/2018               Expected Discharge Plan:  [place]  Discharge planning Services  CM Consult  Post Acute Care Choice:  Home Health Choice offered to:  Patient  HH Arranged:  RN, PT, OT, Nurse's Aide, Social Work [employer] Agency:  [employer]  Status of Service:  Completed, signed off  Rae Mar, RN/ 04/19/2018, 9:54 AM

## 2018-04-19 NOTE — ED Notes (Signed)
Patient ambulated to BR with walker with minimal assistance. Tolerated well. Stated her head still hurt when ambulating.

## 2018-04-19 NOTE — ED Notes (Signed)
Pt remembers entire event. Denies LOC. CAOx4 currently

## 2018-04-20 ENCOUNTER — Ambulatory Visit: Payer: Self-pay

## 2018-04-20 ENCOUNTER — Telehealth: Payer: Self-pay | Admitting: Neurology

## 2018-04-20 ENCOUNTER — Ambulatory Visit: Payer: PPO | Admitting: Neurology

## 2018-04-20 ENCOUNTER — Other Ambulatory Visit: Payer: Self-pay

## 2018-04-20 NOTE — Patient Outreach (Signed)
[place] [facility]) Care Management  04/20/2018  EVERLEIGH COLCLASURE 26-Jun-1951 [id number]  BSW received an incomming call from [facility] with Dr. Jason Nest office. Johnny Bridge confirmed knowledge of patients recent  ED visits and expresses an inability to contact the patient as well. BSW discussed closing patients case if contact is not established during the next outreach attempt. Johnny Bridge stated understanding and confirmed she would update Dr. Justin Mend.  Daneen Schick, BSW, CDP [facility] [phone]

## 2018-04-20 NOTE — Patient Outreach (Signed)
[facility]) Care Management  04/20/2018  KAYLENN CIVIL [birth year] [id number]  Unsuccessful outreach to the patient on today's date, a HIPAA compliant voice message was left requesting a return call.  Upon chart review it is noted the patient has been seen in the ED x 2 since 9/21 for a fall and loss of consciousness. Patient was a no show for a scheduled neurology appointment on today's date. BSW contact [facility] [place] and spoke with Colletta Maryland to report concern. BSW was transferred to the nurse line. BSW left a voice message reporting concern of inability to contact the patient after documented falls.  Plan: BSW will attempt a third and final outreach within the next four business days.  Daneen Schick, BSW, CDP [facility] [phone]

## 2018-04-20 NOTE — Telephone Encounter (Signed)
This patient did not show for a revisit appointment today. 

## 2018-04-21 ENCOUNTER — Telehealth: Payer: Self-pay | Admitting: Neurology

## 2018-04-21 ENCOUNTER — Encounter: Payer: Self-pay | Admitting: Neurology

## 2018-04-21 NOTE — Telephone Encounter (Signed)
Patient arrived in lobby - she was 1 day late (got the dates mixed up). Would like a call back from the nurse to work her in sooner than next available. Best call back [phone]

## 2018-04-21 NOTE — Telephone Encounter (Signed)
Per Ronald Pippins, she called and LVM for pt offering below appt date/time. Waiting on return call from pt

## 2018-04-21 NOTE — Telephone Encounter (Signed)
I called pt back. Offered this Friday (04/23/18) at 12pm, check in 1130am. Pt agreeable to date/time. I scheduled pt. She verbalized understanding and appreciation.

## 2018-04-21 NOTE — Telephone Encounter (Signed)
Spoke with Ronald Pippins. She will contact pt back and offer sooner appt.  Advised she can offer 04/23/18 at 12pm or 04/26/18 at 730am

## 2018-04-22 DIAGNOSIS — M25552 Pain in left hip: Secondary | ICD-10-CM | POA: Diagnosis not present

## 2018-04-22 DIAGNOSIS — M25551 Pain in right hip: Secondary | ICD-10-CM | POA: Diagnosis not present

## 2018-04-22 DIAGNOSIS — I951 Orthostatic hypotension: Secondary | ICD-10-CM | POA: Diagnosis not present

## 2018-04-22 DIAGNOSIS — M25562 Pain in left knee: Secondary | ICD-10-CM | POA: Diagnosis not present

## 2018-04-22 DIAGNOSIS — S0181XD Laceration without foreign body of other part of head, subsequent encounter: Secondary | ICD-10-CM | POA: Diagnosis not present

## 2018-04-22 DIAGNOSIS — Z9181 History of falling: Secondary | ICD-10-CM | POA: Diagnosis not present

## 2018-04-22 DIAGNOSIS — Z8673 Personal history of transient ischemic attack (TIA), and cerebral infarction without residual deficits: Secondary | ICD-10-CM | POA: Diagnosis not present

## 2018-04-22 DIAGNOSIS — W19XXXD Unspecified fall, subsequent encounter: Secondary | ICD-10-CM | POA: Diagnosis not present

## 2018-04-23 ENCOUNTER — Ambulatory Visit: Payer: Self-pay | Admitting: Neurology

## 2018-04-23 ENCOUNTER — Telehealth: Payer: Self-pay

## 2018-04-23 ENCOUNTER — Telehealth: Payer: Self-pay | Admitting: Neurology

## 2018-04-23 NOTE — Telephone Encounter (Signed)
Pt no show for appt today.

## 2018-04-23 NOTE — Telephone Encounter (Signed)
The patient has no showed for revisits on 3 occasions within the last 12 months, she will be discharged from our practice.  She did not show on 01 June 2017, April 20, 2018, and April 23, 2018.

## 2018-04-26 ENCOUNTER — Encounter: Payer: Self-pay | Admitting: Neurology

## 2018-04-26 ENCOUNTER — Other Ambulatory Visit: Payer: Self-pay

## 2018-04-26 NOTE — Patient Outreach (Signed)
[place] [facility]) Care Management  04/26/2018  Dorothy White 1951-05-21 [id number]  Successful call to the patient on today's date, HIPAA identifiers confirmed. BSW attempted to conduct a community resource consult. AS BSW asked the patient if she was still experiencing transportation challenges the patient stated "okay, thank you. Bye".   BSW to perform a case closure as the patient has chosen to discontinue services. BSW has sent a case closure letter to the patients primary physician.  Daneen Schick, BSW, CDP [facility] [phone]

## 2018-04-27 ENCOUNTER — Encounter: Payer: Self-pay | Admitting: Neurology

## 2018-04-27 DIAGNOSIS — M25562 Pain in left knee: Secondary | ICD-10-CM | POA: Diagnosis not present

## 2018-04-27 DIAGNOSIS — Z8673 Personal history of transient ischemic attack (TIA), and cerebral infarction without residual deficits: Secondary | ICD-10-CM | POA: Diagnosis not present

## 2018-04-27 DIAGNOSIS — M25551 Pain in right hip: Secondary | ICD-10-CM | POA: Diagnosis not present

## 2018-04-27 DIAGNOSIS — S0181XD Laceration without foreign body of other part of head, subsequent encounter: Secondary | ICD-10-CM | POA: Diagnosis not present

## 2018-04-27 DIAGNOSIS — W19XXXD Unspecified fall, subsequent encounter: Secondary | ICD-10-CM | POA: Diagnosis not present

## 2018-04-27 DIAGNOSIS — I951 Orthostatic hypotension: Secondary | ICD-10-CM | POA: Diagnosis not present

## 2018-04-27 DIAGNOSIS — M25552 Pain in left hip: Secondary | ICD-10-CM | POA: Diagnosis not present

## 2018-04-27 DIAGNOSIS — Z9181 History of falling: Secondary | ICD-10-CM | POA: Diagnosis not present

## 2018-04-29 ENCOUNTER — Other Ambulatory Visit: Payer: Self-pay

## 2018-04-29 DIAGNOSIS — S0180XD Unspecified open wound of other part of head, subsequent encounter: Secondary | ICD-10-CM | POA: Diagnosis not present

## 2018-04-29 DIAGNOSIS — M25562 Pain in left knee: Secondary | ICD-10-CM | POA: Diagnosis not present

## 2018-04-29 DIAGNOSIS — I951 Orthostatic hypotension: Secondary | ICD-10-CM | POA: Diagnosis not present

## 2018-04-29 DIAGNOSIS — M25552 Pain in left hip: Secondary | ICD-10-CM | POA: Diagnosis not present

## 2018-04-29 DIAGNOSIS — Z9181 History of falling: Secondary | ICD-10-CM | POA: Diagnosis not present

## 2018-04-29 DIAGNOSIS — W19XXXD Unspecified fall, subsequent encounter: Secondary | ICD-10-CM | POA: Diagnosis not present

## 2018-04-29 DIAGNOSIS — Z8673 Personal history of transient ischemic attack (TIA), and cerebral infarction without residual deficits: Secondary | ICD-10-CM | POA: Diagnosis not present

## 2018-04-29 DIAGNOSIS — S0181XD Laceration without foreign body of other part of head, subsequent encounter: Secondary | ICD-10-CM | POA: Diagnosis not present

## 2018-04-29 DIAGNOSIS — M25551 Pain in right hip: Secondary | ICD-10-CM | POA: Diagnosis not present

## 2018-04-29 NOTE — Patient Outreach (Signed)
[place] [facility]) Care Management  04/29/2018  Dorothy White 1950/08/07 [id number]  Incoming call from the patient on today's date, HIPAA identifiers confirmed. The patient indicates she received a case closure letter from [facility] and "still needs help". BSW spoke with the patient about the recent conversation when the patient hung up on BSW. Patient is unable to recall this conversation. Patient disclosed she became lost on Monday while driving and "had to leave the car to find home". The patient reports planning to meet with a case worker tomorrow to discuss long term placement options. The patient reports needing to move into an ALF due to an inability to care for herself. BSW encouraged the patient to keep this BSW's contact number and to place a self-referral if assistance is needed after tomorrows appointment. The patient stated understanding.  During today's call the patient seemed to have trouble with word finding and recalling recent events. BSW will route note to the patients provider for communication purposes.  Daneen Schick, BSW, CDP [facility] [phone]

## 2018-04-30 ENCOUNTER — Encounter (HOSPITAL_COMMUNITY): Payer: Self-pay | Admitting: Emergency Medicine

## 2018-04-30 ENCOUNTER — Emergency Department (HOSPITAL_COMMUNITY)
Admission: EM | Admit: 2018-04-30 | Discharge: 2018-05-03 | Disposition: A | Discharge status: Skilled Nursing Facility | Payer: Medicare HMO | Attending: Emergency Medicine | Admitting: Emergency Medicine

## 2018-04-30 ENCOUNTER — Emergency Department (HOSPITAL_COMMUNITY): Payer: Medicare HMO

## 2018-04-30 ENCOUNTER — Other Ambulatory Visit: Payer: Self-pay

## 2018-04-30 DIAGNOSIS — F329 Major depressive disorder, single episode, unspecified: Secondary | ICD-10-CM | POA: Insufficient documentation

## 2018-04-30 DIAGNOSIS — R413 Other amnesia: Secondary | ICD-10-CM | POA: Insufficient documentation

## 2018-04-30 DIAGNOSIS — R4020 Unspecified coma: Secondary | ICD-10-CM | POA: Diagnosis not present

## 2018-04-30 DIAGNOSIS — R05 Cough: Secondary | ICD-10-CM | POA: Insufficient documentation

## 2018-04-30 DIAGNOSIS — F0390 Unspecified dementia without behavioral disturbance: Secondary | ICD-10-CM | POA: Insufficient documentation

## 2018-04-30 DIAGNOSIS — Z96642 Presence of left artificial hip joint: Secondary | ICD-10-CM | POA: Diagnosis not present

## 2018-04-30 DIAGNOSIS — Z9181 History of falling: Secondary | ICD-10-CM | POA: Diagnosis not present

## 2018-04-30 DIAGNOSIS — R296 Repeated falls: Secondary | ICD-10-CM | POA: Insufficient documentation

## 2018-04-30 DIAGNOSIS — F17210 Nicotine dependence, cigarettes, uncomplicated: Secondary | ICD-10-CM | POA: Insufficient documentation

## 2018-04-30 DIAGNOSIS — R059 Cough, unspecified: Secondary | ICD-10-CM

## 2018-04-30 DIAGNOSIS — I2510 Atherosclerotic heart disease of native coronary artery without angina pectoris: Secondary | ICD-10-CM | POA: Insufficient documentation

## 2018-04-30 DIAGNOSIS — G8929 Other chronic pain: Secondary | ICD-10-CM | POA: Insufficient documentation

## 2018-04-30 DIAGNOSIS — J984 Other disorders of lung: Secondary | ICD-10-CM | POA: Diagnosis not present

## 2018-04-30 LAB — COMPREHENSIVE METABOLIC PANEL
ALBUMIN: 3.8 g/dL (ref 3.5–5.0)
ALK PHOS: 128 U/L — AB (ref 38–126)
ALT: 10 U/L (ref 0–44)
ANION GAP: 9 (ref 5–15)
AST: 17 U/L (ref 15–41)
BUN: 19 mg/dL (ref 8–23)
CHLORIDE: 112 mmol/L — AB (ref 98–111)
CO2: 23 mmol/L (ref 22–32)
Calcium: 9.4 mg/dL (ref 8.9–10.3)
Creatinine, Ser: 0.9 mg/dL (ref 0.44–1.00)
GFR calc Af Amer: >60 mL/min (ref >60)
GFR calc non Af Amer: >60 mL/min (ref >60)
GLUCOSE: 145 mg/dL — AB (ref 70–99)
POTASSIUM: 3.8 mmol/L (ref 3.5–5.1)
SODIUM: 144 mmol/L (ref 135–145)
Total Bilirubin: 0.4 mg/dL (ref 0.3–1.2)
Total Protein: 6.7 g/dL (ref 6.5–8.1)

## 2018-04-30 LAB — CBC
HCT: 39.7 % (ref 36.0–46.0)
HEMOGLOBIN: 12.4 g/dL (ref 12.0–15.0)
MCH: 28 pg (ref 26.0–34.0)
MCHC: 31.2 g/dL (ref 30.0–36.0)
MCV: 89.6 fL (ref 78.0–100.0)
PLATELETS: 353 10*3/uL (ref 150–400)
RBC: 4.43 MIL/uL (ref 3.87–5.11)
RDW: 15 % (ref 11.5–15.5)
WBC: 5.3 10*3/uL (ref 4.0–10.5)

## 2018-04-30 LAB — URINALYSIS, ROUTINE W REFLEX MICROSCOPIC
BACTERIA UA: NONE SEEN
BILIRUBIN URINE: NEGATIVE
GLUCOSE, UA: NEGATIVE mg/dL
HGB URINE DIPSTICK: NEGATIVE
Ketones, ur: 5 mg/dL — AB
LEUKOCYTES UA: NEGATIVE
NITRITE: NEGATIVE
PROTEIN: 30 mg/dL — AB
Specific Gravity, Urine: 1.03 (ref 1.005–1.030)
pH: 5 (ref 5.0–8.0)

## 2018-04-30 MED ORDER — TRAMADOL HCL 50 MG PO TABS
50.0000 mg | ORAL_TABLET | Freq: Four times a day (QID) | ORAL | Status: DC | PRN
  Administered 2018-05-01 – 2018-05-02 (×3): 50 mg via ORAL
  Filled 2018-04-30 (×3): qty 1

## 2018-04-30 MED ORDER — TRAZODONE HCL 100 MG PO TABS
300.0000 mg | ORAL_TABLET | Freq: Every day | ORAL | Status: DC
  Administered 2018-04-30 – 2018-05-02 (×3): 300 mg via ORAL
  Filled 2018-04-30 (×3): qty 3

## 2018-04-30 MED ORDER — FLUOXETINE HCL 20 MG PO CAPS
80.0000 mg | ORAL_CAPSULE | Freq: Every day | ORAL | Status: DC
  Administered 2018-05-01 – 2018-05-03 (×3): 80 mg via ORAL
  Filled 2018-04-30 (×3): qty 4

## 2018-04-30 MED ORDER — ZOLPIDEM TARTRATE 10 MG PO TABS
10.0000 mg | ORAL_TABLET | Freq: Every evening | ORAL | Status: DC | PRN
  Administered 2018-05-01: 10 mg via ORAL
  Filled 2018-04-30: qty 1

## 2018-04-30 MED ORDER — GABAPENTIN 100 MG PO CAPS: 100.0000 mg | ORAL_CAPSULE | Freq: Every day | ORAL | Status: DC | PRN

## 2018-04-30 MED ORDER — TIZANIDINE HCL 4 MG PO TABS: 4.0000 mg | ORAL_TABLET | Freq: Three times a day (TID) | ORAL | Status: DC | PRN

## 2018-04-30 MED ORDER — TOPIRAMATE 100 MG PO TABS
100.0000 mg | ORAL_TABLET | Freq: Every day | ORAL | Status: DC
  Administered 2018-04-30 – 2018-05-02 (×3): 100 mg via ORAL
  Filled 2018-04-30 (×3): qty 1

## 2018-04-30 MED ORDER — NICOTINE 14 MG/24HR TD PT24: 14.0000 mg | MEDICATED_PATCH | Freq: Once | TRANSDERMAL | Status: DC

## 2018-04-30 NOTE — ED Notes (Signed)
Bed: WA30 Expected date:  Expected time:  Means of arrival:  Comments: 

## 2018-04-30 NOTE — ED Triage Notes (Addendum)
Brother at bedside verbalizes pt "periods of confusion at times." Police came to the house and found her car a few miles away from the house; missing person report was filled. She [pt] did not remember event; wanders at times. Here related to unable to care for herself.

## 2018-04-30 NOTE — Progress Notes (Signed)
CSW requested EDP place a PT consult for SNF/Memory Care placement at this time due to fluctuating orientation and a HX of falls and injuries to the head resulting in 10 ED visits in 6 months, per the ED RN CM.  2nd shift ED CSW will leave handoff for 1st shift ED CSW.

## 2018-04-30 NOTE — ED Notes (Addendum)
Rip Harbour RN is ready to accept assignment. Pt is currently finishing a meal and will be transferred over to TCU for boarding.

## 2018-04-30 NOTE — BH Assessment (Addendum)
Assessment Note  Dorothy White is an 67 y.o. female who presents to the ED accompanied by her brother and her friend (landlord). Pt states she came to the ED due to having memory loss and being confused. Pt asked her brother to provide this information to TTS as she stated "I have already said this so many times I just don't want to say it again." Pt's brother then states the pt has been falling frequently and wandering off without telling anyone where she is going. The pt currently lives alone however the family feels the pt is no longer able to care for herself and will need an assisted living facility. The pt reportedly tried to leave her home recently around 2am and when she was asked where she was going she responded "I don't know." Pt's brother reports the pt got into her car recently and parked in a random person's driveway and got out of the car and started walking down the street. Pt's brother states they were about to file a missing persons report on the pt until she was found by EMS. Pt denies SI, HI and denies AVH. Pt states she has a hx of depression but denies any symptoms at current. Pt denies hx of SA and denies hx of self-harm.  TTS consulted with Patriciaann Clan, PA who states the pt does not meet criteria for inpt treatment. Pt is psych cleared and PA recommends social work consult follow up and continue to work with pt as they are already involved per chart review. TTS advised the pt's nurse Rip Harbour, RN of the disposition.   Diagnosis: Mild neurocognitive disorder due to another medical condition  Past Medical History:  Past Medical History:  Diagnosis Date  . Anemia    ?  Marland Kitchen Anxiety   . Arthritis    "everywhere" (04/25/2016)  . 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 Chronic back pain    "all over" (04/25/2016)  . Constipation   . Depression   . Diverticulitis   . GERD (gastroesophageal reflux disease)   . Headache    "weekly" (04/25/2016)  . 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 HLD (hyperlipidemia)    hx (04/25/2016)  . 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 Mild CAD    a. 25% mLAD, otherwise no sig disease 01/2016 cath.  Marland Kitchen NICM (nonischemic cardiomyopathy) ([place])    a. EF 40-45% by echo 11/8525 at time of complicated migraine/neuro sx, 55-65% at time of cath 01/2016  . Stroke [facility]) 06/2013   "mini" stroke , ?possibly hemaplegic migraine per pt    Past Surgical History:  Procedure Laterality Date  . ANTERIOR APPROACH HEMI HIP ARTHROPLASTY Left 04/01/2017   Procedure: LEFT DIRECT ANTERIOR TOTAL HIP REPLACEMENT;  Surgeon: Leandrew Koyanagi, MD;  Location: [place];  Service: Orthopedics;  Laterality: Left;  LEFT DIRECT ANTERIOR TOTAL HIP REPLACEMENT  . AUGMENTATION MAMMAPLASTY  1980  . BASAL CELL CARCINOMA EXCISION     "tip of my nose"  . BUNIONECTOMY Bilateral 10/2003  . CARDIAC CATHETERIZATION N/A 02/20/2016   Procedure: Left Heart Cath and Coronary Angiography;  Surgeon: Jettie Booze, MD;  Location: [place] [facility];  Service: Cardiovascular;  Laterality: N/A;  . COLONOSCOPY    . DILATION AND CURETTAGE OF UTERUS    . FRACTURE SURGERY    . IR GENERIC HISTORICAL  03/18/2016   IR SINUS/FIST TUBE CHK-NON GI 03/18/2016 Aletta Edouard, MD [facility]-INTERV  RAD  . LAPAROSCOPIC SIGMOID COLECTOMY N/A 04/25/2016   Procedure: LAPAROSCOPIC SIGMOID COLECTOMY;  Surgeon: Stark Klein, MD;  Location: [place];  Service: General;  Laterality: N/A;  . OOPHORECTOMY Bilateral ~ 1999  . ORIF HUMERUS FRACTURE Right 02/15/2015   Procedure: OPEN REDUCTION INTERNAL FIXATION (ORIF) PROXIMAL HUMERUS FRACTURE;  Surgeon: Netta Cedars, MD;  Location: [place];  Service: Orthopedics;  Laterality: Right;  . RHINOPLASTY  1976  . TONSILLECTOMY    . TUBAL LIGATION  ~ 1983  . VAGINAL HYSTERECTOMY  ~ 1998    Family History:  Family History  Problem Relation Age of Onset  . Lung cancer Mother 30  . Migraines Mother   . Heart attack  Father        Vague  . Hypertension Brother   . Esophageal cancer Brother   . Diabetes Paternal Grandmother        questionable  . Colon cancer Neg Hx   . Kidney disease Neg Hx   . Liver disease Neg Hx     Social History:  reports that she has been smoking cigarettes. She has a 34.00 pack-year smoking history. She has never used smokeless tobacco. She reports that she drinks about 1.0 standard drinks of alcohol per week. She reports that she does not use drugs.  Additional Social History:  Alcohol / Drug Use Pain Medications: see MAR Prescriptions: see MAR Over the Counter: see MAR History of alcohol / drug use?: No history of alcohol / drug abuse  CIWA: CIWA-Ar BP: (!) 150/82 Pulse Rate: 75 COWS:    Allergies:  Allergies  Allergen Reactions  . Opana [Oxymorphone Hcl] Other (See Comments)    hallucinations  . Penicillins Hives    Has patient had a PCN reaction causing immediate rash, facial/tongue/throat swelling, SOB or lightheadedness with hypotension: Unknown Has patient had a PCN reaction causing severe rash involving mucus membranes or skin necrosis: No Has patient had a PCN reaction that required hospitalization No Has patient had a PCN reaction occurring within the last 10 years: No If all of the above answers are "NO", then may proceed with Cephalosporin use.     Home Medications:  (Not in a hospital admission)  OB/GYN Status:  No LMP recorded. Patient has had a hysterectomy.  General Assessment Data Location of Assessment: [facility] TTS Assessment: In system Is this a Tele or Face-to-Face Assessment?: Face-to-Face Is this an Initial Assessment or a Re-assessment for this encounter?: Initial Assessment Patient Accompanied by:: Other(brother, friend ) Language Other than English: No What gender do you identify as?: Female Marital status: Single Pregnancy Status: No Living Arrangements: Non-relatives/Friends Can pt return to current living arrangement?:  Yes Admission Status: Voluntary Is patient capable of signing voluntary admission?: Yes Referral Source: Self/Family/Friend Insurance type: [facility]     Crisis Care Plan Living Arrangements: Non-relatives/Friends Name of Psychiatrist: none Name of Therapist: none  Education Status Is patient currently in school?: No Is the patient employed, unemployed or receiving disability?: (retired)  Risk to self with the past 6 months Suicidal Ideation: No Has patient been a risk to self within the past 6 months prior to admission? : Yes(due to wandering ) Suicidal Intent: No Has patient had any suicidal intent within the past 6 months prior to admission? : No Is patient at risk for suicide?: No Suicidal Plan?: No Has patient had any suicidal plan within the past 6 months prior to admission? : No Access to Means: No What has been your use of  drugs/alcohol within the last 12 months?: denies use  Previous Attempts/Gestures: No Triggers for Past Attempts: None known Intentional Self Injurious Behavior: None Family Suicide History: No Recent stressful life event(s): Recent negative physical changes Persecutory voices/beliefs?: No Depression: No Substance abuse history and/or treatment for substance abuse?: No Suicide prevention information given to non-admitted patients: Not applicable  Risk to Others within the past 6 months Homicidal Ideation: No Does patient have any lifetime risk of violence toward others beyond the six months prior to admission? : No Thoughts of Harm to Others: No Current Homicidal Intent: No Current Homicidal Plan: No Access to Homicidal Means: No History of harm to others?: No Assessment of Violence: None Noted Does patient have access to weapons?: No Criminal Charges Pending?: No Does patient have a court date: No Is patient on probation?: No  Psychosis Hallucinations: None noted Delusions: None noted  Mental Status Report Appearance/Hygiene:  Unremarkable Eye Contact: Good Motor Activity: Freedom of movement Speech: Logical/coherent Level of Consciousness: Alert Mood: Pleasant, Anxious Affect: Appropriate to circumstance, Anxious Anxiety Level: Moderate Thought Processes: Relevant, Coherent Judgement: Partial Orientation: Place, Person, Time, Situation, Appropriate for developmental age Obsessive Compulsive Thoughts/Behaviors: None  Cognitive Functioning Concentration: Decreased Memory: Recent Impaired, Remote Impaired Is patient IDD: No Insight: Fair Impulse Control: Fair Appetite: Fair Have you had any weight changes? : No Change Sleep: Decreased Total Hours of Sleep: 6 Vegetative Symptoms: None  ADLScreening [facility] Assessment Services) Patient's cognitive ability adequate to safely complete daily activities?: Yes Patient able to express need for assistance with ADLs?: Yes Independently performs ADLs?: Yes (appropriate for developmental age)  Prior Inpatient Therapy Prior Inpatient Therapy: Yes Prior Therapy Dates: 2008 Prior Therapy Facilty/Provider(s): [facility] Reason for Treatment: DOES NOT RECALL  Prior Outpatient Therapy Prior Outpatient Therapy: No Does patient have an ACCT team?: No Does patient have Intensive In-House Services?  : No Does patient have [facility] services? : No Does patient have P4CC services?: No  ADL Screening (condition at time of admission) Patient's cognitive ability adequate to safely complete daily activities?: Yes Is the patient deaf or have difficulty hearing?: No Does the patient have difficulty seeing, even when wearing glasses/contacts?: No Does the patient have difficulty concentrating, remembering, or making decisions?: Yes Patient able to express need for assistance with ADLs?: Yes Does the patient have difficulty dressing or bathing?: No Independently performs ADLs?: Yes (appropriate for developmental age) Does the patient have difficulty walking or climbing stairs?:  No Weakness of Legs: None Weakness of Arms/Hands: None  Home Assistive Devices/Equipment Home Assistive Devices/Equipment: Eyeglasses    Abuse/Neglect Assessment (Assessment to be complete while patient is alone) Abuse/Neglect Assessment Can Be Completed: Yes Physical Abuse: Denies Verbal Abuse: Denies Sexual Abuse: Denies Exploitation of patient/patient's resources: Denies Self-Neglect: Denies     Regulatory affairs officer (For Healthcare) Does Patient Have a Medical Advance Directive?: No Would patient like information on creating a medical advance directive?: No - Patient declined          Disposition: TTS consulted with Patriciaann Clan, PA who states the pt does not meet criteria for inpt treatment. Pt is psych cleared and PA recommends social work consult follow up and continue to work with pt as they are already involved per chart review. TTS advised the pt's nurse Rip Harbour, RN of the disposition.   Disposition Initial Assessment Completed for this Encounter: Yes Patient referred to: Social Work  On Site Evaluation by:   Reviewed with Physician:    Lyanne Co 04/30/2018 8:29 PM

## 2018-04-30 NOTE — Progress Notes (Addendum)
2nd shift ED CSW received a handoff from the 1st shift [facility] ED CSW.   Pt is in the ED due to family/friends concerns pt has had multiple ED visits (10 in past 6 months, per the [facility] ED RN CM) with some being due to pt's complaints of falls resulting in head injuries during periods of confusion experienced by the pt.  Pt reported to others self-awareness that pt increasingly has loss of memory due to dementia resulting in falls and pt herself is concerned about this and has been in the past but has always felt, "I don't want to give up, I will get better.  Pt today states she realized she needs to seek admission to a facility for her own safety stating, "I don't want to fall anymore" and is willing at this time to be admitted into a ALF/Memory Care Unit.  CSW staffed with the CSW Asst Director And the [facility] ED RN CM who agrees with the EDP that placement for pt's safety is necessary if pt lives alone and pt's family is not available to to assist pt with safety once pt is D/C'd.  CSW met with pt's brother who is in current treatment for cancer and who states that he cannot physically care for the pt.    Pt's landlord no longer feels safe that pt can live alone in the residence owned by the pt's landlord that is rented to the pt.  Pt has a female friend who is a member of a "Medical Society" in [place] who is closely involved and supportive of the pt.  Per family and pt, pt cannot private pay more than $1,000.  In addition, per Jessica at [facility] at ph: [phone]  and Mia the admin at [facility] at ph: [phone], the pt will need to apply and be approved for "Special Assistance Medicaid" (pt already has MEDICAID [place]/MEDICAID OF [place]) and once that is approved pt can be accepted to a [facility] ALF/Memory Care.  Of note: CSW (this writer) has been told by other ALF's that they can accept pt's who have "just applied only" for  "Special Assistance Medicaid" and then pt can be accepted  using the pt's "regular" Medicaid once the application has been finished.  CSW will send referrals out to other facilities.  Per the CSW Asst Director the pt will be referred out to facilities not only in  but in the surrounding [place] areas so that placement can take place as early as the week of 10/7-10/12.  Pt and family are aware pt can alkways switch to a closer facility once pt is D.C'd from the [facility] ED to a facility then decided the ct desires to move closer.  CSW met with pt and confirmed pt's plan to be discharged to SNF or ALF Memory Care Unit to live at discharge.  CSW provided active listening and validated pt's concerns.   CSW will complete FL-2 and send referrals out to SNF first and then to ALF/Memory Care facilities via the hub per pt's request.  Pt has been living independently prior to being admitted to [facility].  CSW will continue to follow for D/C needs.   F. , LCSW, LCAS, CSI Clinical Social Worker Ph: [phone]                 .    

## 2018-04-30 NOTE — ED Provider Notes (Signed)
[facility] Emergency Department Provider Note MRN:  [id number]  Arrival date & time: 04/30/18     Chief Complaint   Memory Loss   History of Present Illness   Dorothy White is a 67 y.o. year-old female with a history of depression, CAD, stroke presenting to the ED with chief complaint of memory loss.  Patient and patient's brother reports that the patient has had progressively worsening cognitive abilities for the past several months to years.  Very forgetful, no longer able to drive, still lives alone but is becoming more of an issue.  Earlier this week, she left the house and took the car for drive.  She could not find her way home.  A missing person report was filed, she was eventually found by police.  Frequent falls related to chronic back pain, chronic vertigo.  Had a fall earlier this week.  Positive head trauma, no loss of consciousness.  Brought here today by brother who is concerned that she needs a different living situation, no longer safe to live alone.  Patient denies recent fever, no chest pain or shortness of breath, no dysuria.  Review of Systems  A complete 10 system review of systems was obtained and all systems are negative except as noted in the HPI and PMH.   Patient's Health History    Past Medical History:  Diagnosis Date  . Anemia    ?  Marland Kitchen Anxiety   . Arthritis    "everywhere" (04/25/2016)  . 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 Chronic back pain    "all over" (04/25/2016)  . Constipation   . Depression   . Diverticulitis   . GERD (gastroesophageal reflux disease)   . Headache    "weekly" (04/25/2016)  . 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 HLD (hyperlipidemia)    hx (04/25/2016)  . 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 Mild CAD    a. 25% mLAD, otherwise no sig disease 01/2016 cath.  Marland Kitchen NICM (nonischemic cardiomyopathy) ([place])    a. EF 40-45% by echo 11/2015 at time of  complicated migraine/neuro sx, 55-65% at time of cath 01/2016  . Stroke [facility]) 06/2013   "mini" stroke , ?possibly hemaplegic migraine per pt    Past Surgical History:  Procedure Laterality Date  . ANTERIOR APPROACH HEMI HIP ARTHROPLASTY Left 04/01/2017   Procedure: LEFT DIRECT ANTERIOR TOTAL HIP REPLACEMENT;  Surgeon: Leandrew Koyanagi, MD;  Location: [place];  Service: Orthopedics;  Laterality: Left;  LEFT DIRECT ANTERIOR TOTAL HIP REPLACEMENT  . AUGMENTATION MAMMAPLASTY  1980  . BASAL CELL CARCINOMA EXCISION     "tip of my nose"  . BUNIONECTOMY Bilateral 10/2003  . CARDIAC CATHETERIZATION N/A 02/20/2016   Procedure: Left Heart Cath and Coronary Angiography;  Surgeon: Jettie Booze, MD;  Location: [place] [facility];  Service: Cardiovascular;  Laterality: N/A;  . COLONOSCOPY    . DILATION AND CURETTAGE OF UTERUS    . FRACTURE SURGERY    . IR GENERIC HISTORICAL  03/18/2016   IR SINUS/FIST TUBE CHK-NON GI 03/18/2016 Aletta Edouard, MD [facility]-INTERV RAD  . LAPAROSCOPIC SIGMOID COLECTOMY N/A 04/25/2016   Procedure: LAPAROSCOPIC SIGMOID COLECTOMY;  Surgeon: Stark Klein, MD;  Location: [place];  Service: General;  Laterality: N/A;  . OOPHORECTOMY Bilateral ~ 1999  . ORIF HUMERUS FRACTURE Right 02/15/2015   Procedure: OPEN REDUCTION INTERNAL FIXATION (ORIF) PROXIMAL HUMERUS FRACTURE;  Surgeon: Netta Cedars, MD;  Location: [place];  Service: Orthopedics;  Laterality: Right;  . RHINOPLASTY  1976  . TONSILLECTOMY    . TUBAL LIGATION  ~ 1983  . VAGINAL HYSTERECTOMY  ~ 1998    Family History  Problem Relation Age of Onset  . Lung cancer Mother 31  . Migraines Mother   . Heart attack Father        Vague  . Hypertension Brother   . Esophageal cancer Brother   . Diabetes Paternal Grandmother        questionable  . Colon cancer Neg Hx   . Kidney disease Neg Hx   . Liver disease Neg Hx     Social History   Socioeconomic History  . Marital status: Divorced    Spouse name: Not on file  . Number of  children: 0  . Years of education: 27  . Highest education level: Not on file  Occupational History  . 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 Financial resource strain: Not on file  . Food insecurity:    Worry: Not on file    Inability: Not on file  . Transportation needs:    Medical: Not on file    Non-medical: Not on file  Tobacco Use  . Smoking status: Current Some Day Smoker    Packs/day: 1.00    Years: 34.00    Pack years: 34.00    Types: Cigarettes    Last attempt to quit: 02/03/2017    Years since quitting: 1.2  . Smokeless tobacco: Never Used  . Tobacco comment: 10 per week or less  Substance and Sexual Activity  . Alcohol use: Yes    Alcohol/week: 1.0 standard drinks    Types: 1 Standard drinks or equivalent per week    Comment: occ   . Drug use: No    Types: Cocaine    Comment: Denies any drug use 02/19/18  . Sexual activity: Never  Lifestyle  . Physical activity:    Days per week: Not on file    Minutes per session: Not on file  . Stress: Not on file  Relationships  . Social connections:    Talks on phone: Not on file    Gets together: Not on file    Attends religious service: Not on file    Active member of club or organization: Not on file    Attends meetings of clubs or organizations: Not on file    Relationship status: Not on file  . Intimate partner violence:    Fear of current or ex partner: Not on file    Emotionally abused: Not on file    Physically abused: Not on file    Forced sexual activity: Not on file  Other Topics Concern  . Not on file  Social History Narrative   Patient lives at home alone.    Patient is retired.   Education- High school and some college   Right handed.   Patient drinks one cup of coffee and a big glass of ice tea.     Physical Exam  Vital Signs and Nursing Notes reviewed Vitals:   04/30/18 1411 04/30/18 1717  BP: (!) 148/85 (!)  150/82  Pulse: 99 75  Resp: 16 16  Temp: 98.2 F (36.8 C)   SpO2: 96% 100%    CONSTITUTIONAL: Well-appearing, NAD NEURO:  Alert and oriented x 3, no focal deficits EYES:  eyes equal and reactive ENT/NECK:  no LAD, no JVD CARDIO: Regular rate, well-perfused, normal S1 and S2 PULM:  CTAB no wheezing or rhonchi GI/GU:  normal bowel sounds, non-distended, non-tender MSK/SPINE:  No gross deformities, no edema SKIN:  no rash, atraumatic PSYCH:  Appropriate speech and behavior  Diagnostic and Interventional Summary    EKG Interpretation  Date/Time:    Ventricular Rate:    PR Interval:    QRS Duration:   QT Interval:    QTC Calculation:   R Axis:     Text Interpretation:        Labs Reviewed  COMPREHENSIVE METABOLIC PANEL - Abnormal; Notable for the following components:      Result Value   Chloride 112 (*)    Glucose, Bld 145 (*)    Alkaline Phosphatase 128 (*)    All other components within normal limits  URINALYSIS, ROUTINE W REFLEX MICROSCOPIC - Abnormal; Notable for the following components:   Ketones, ur 5 (*)    Protein, ur 30 (*)    All other components within normal limits  CBC    DG Chest 2 View  Final Result    CT HEAD WO CONTRAST  Final Result      Medications  nicotine (NICODERM CQ - dosed in mg/24 hours) patch 14 mg (has no administration in time range)  FLUoxetine (PROZAC) tablet 80 mg (has no administration in time range)  gabapentin (NEURONTIN) capsule 100 mg (has no administration in time range)  tiZANidine (ZANAFLEX) tablet 4 mg (has no administration in time range)  topiramate (TOPAMAX) tablet 100 mg (has no administration in time range)  traMADol (ULTRAM) tablet 50 mg (has no administration in time range)  zolpidem (AMBIEN) tablet 10 mg (has no administration in time range)     Procedures Critical Care  ED Course and Medical Decision Making  I have reviewed the triage vital signs and the nursing notes.  Pertinent labs & imaging results  that were available during my care of the patient were reviewed by me and considered in my medical decision making (see below for details).  Favoring early onset dementia in the 67 year old female with progressively worsening cognitive disturbance.  Also considering traumatic subdural given recent falls.  No head imaging in our system since July, will reimage today, obtain basic labs, urinalysis, consult social work/case management for placement.  Unable to establish an outside care facility, work-up negative, medically cleared, will hold in the behavioral health unit until placement is possible.  Currently here voluntarily, but family is very confident that she is safe to leave, unsafe to live alone.  Would likely need IVC if she were to try to leave.  Barth Kirks. Sedonia Small, [place] [email or web address]  Final Clinical Impressions(s) / ED Diagnoses     ICD-10-CM   1. Memory loss R41.3   2. Cough R05 DG Chest 2 View    DG Chest 2 View    ED Discharge Orders    None         Maudie Flakes, MD 04/30/18 [phone]

## 2018-04-30 NOTE — Progress Notes (Signed)
Pt denies SI, HI, and AVH. Pt states she is in the ED due to needing placement with an assisted living facility. TTS attempted to contact the EDP to discuss need for TTS consult as social work is currently involved but did not receive an answer. TTS will follow up to discuss the need for psych consult.   Lind Covert, MSW, LCSW Therapeutic Triage Specialist  [phone]

## 2018-04-30 NOTE — NC FL2 (Signed)
[place] LEVEL OF CARE SCREENING TOOL     IDENTIFICATION  Patient Name: Dorothy White Birthdate: 09-14-1950 Sex: female Admission Date (Current Location): 04/30/2018  [facility] and [place] Number:  Herbalist and Address:  [facility],  [place], [place]      Provider Number: [id number]  Attending Physician Name and Address:  Maudie Flakes, MD  Relative Name and Phone Number:       Current Level of Care: Hospital Recommended Level of Care: [place] Prior Approval Number:    Date Approved/Denied:   PASRR Number: [id number]  Discharge Plan: SNF    Current Diagnoses: Patient Active Problem List   Diagnosis Date Noted  . Recurrent falls 04/16/2017  . 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 At risk for adverse drug event 04/07/2017  . Acute blood loss as cause of postoperative anemia 04/04/2017  . Acute delirium 04/04/2017  . Osteoporosis 04/02/2017  . Closed fracture of neck of left femur ([place]) 03/31/2017  . Scalp contusion 03/31/2017  . Fall at home, initial encounter 03/31/2017  . Hip fracture ([place]) 03/31/2017  . 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 Hx of transient ischemic attack (TIA) 02/19/2017  . Left carotid stenosis- 40-59% 02/19/2017  . Malnutrition of moderate degree 02/18/2017  . Falls 02/17/2017  . Chest pain 09/26/2016  . Diverticulitis of sigmoid colon 04/25/2016  . Infection due to ESBL-producing Escherichia coli/Diverticular Abscess 03/15/2016  . Diverticulitis 03/12/2016  . Chronic pain syndrome 03/12/2016  . Lesion of pancreas 03/12/2016  . History of chest pain 03/12/2016  . Hypokalemia 03/12/2016  . Angina, class IV ([place]) - chest tightness and pressure with dyspnea with minimal exertion 02/17/2016    Class: Question of  . TIA (transient ischemic attack) 12/26/2015  . DOE (dyspnea on exertion) 12/26/2015  . Right sided weakness 12/17/2015  . Ataxia  12/17/2015  . Shoulder fracture 02/15/2015  . Chronic headache 10/11/2013  . Weakness generalized 08/12/2013  . Dizziness and giddiness 08/12/2013  . Abnormality of gait 07/11/2013  . Hyperlipidemia 05/07/2007  . Migraine without aura 05/07/2007  . PREMATURE VENTRICULAR CONTRACTIONS, FREQUENT 05/07/2007  . GERD 05/07/2007  . DIVERTICULOSIS, COLON 05/07/2007  . 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 OSTEOPENIA 05/07/2007  . Anxiety and depression 03/24/2007  . HEMORRHOIDS 03/24/2007  . ALLERGIC RHINITIS 03/24/2007  . LOW BACK PAIN 03/24/2007  . MIGRAINES, HX OF 03/24/2007    Orientation RESPIRATION BLADDER Height & Weight     Self, Time, Situation, Place  Normal Continent Weight:   Height:     BEHAVIORAL SYMPTOMS/MOOD NEUROLOGICAL BOWEL NUTRITION STATUS  Wanderer, Dangerous to self, others or property(Pt has fluctuating orientation, confusion due to dementia resulting in wandering and falls)   Continent    AMBULATORY STATUS COMMUNICATION OF NEEDS Skin   Limited Assist Verbally Normal                       Personal Care Assistance Level of Assistance  Bathing, Dressing Bathing Assistance: Limited assistance   Dressing Assistance: Limited assistance     Functional Limitations Info             SPECIAL CARE FACTORS FREQUENCY  PT (By licensed PT), OT (By licensed OT)     PT Frequency: 5 OT Frequency: 5            Contractures Contractures Info: Not present    Additional Factors Info  Code Status, Allergies Code  Status Info: FULL Allergies Info: Opana Oxymorphone Hcl, Penicillins           Current Medications (04/30/2018):  This is the current hospital active medication list Current Facility-Administered Medications  Medication Dose Route Frequency Provider Last Rate Last Dose  . [START ON 05/01/2018] FLUoxetine (PROZAC) capsule 80 mg  80 mg Oral Daily Maudie Flakes, MD      . gabapentin (NEURONTIN) capsule 100 mg  100 mg Oral Daily PRN Maudie Flakes, MD      . nicotine (NICODERM CQ - dosed in mg/24 hours) patch 14 mg  14 mg Transdermal Once Maudie Flakes, MD      . tiZANidine (ZANAFLEX) tablet 4 mg  4 mg Oral TID PRN Maudie Flakes, MD      . topiramate (TOPAMAX) tablet 100 mg  100 mg Oral QHS Maudie Flakes, MD      . traMADol Veatrice Bourbon) tablet 50 mg  50 mg Oral Q6H PRN Maudie Flakes, MD      . traZODone (DESYREL) tablet 300 mg  300 mg Oral QHS Maudie Flakes, MD      . zolpidem [facility]) tablet 10 mg  10 mg Oral QHS PRN Maudie Flakes, MD       Current Outpatient Medications  Medication Sig Dispense Refill  . FLUoxetine (PROZAC) 40 MG capsule Take 80 mg by mouth daily.  11  . LUTEIN PO Take 1 capsule by mouth daily.     . Magnesium 500 MG TABS Take 500 mg by mouth at bedtime.    Marland Kitchen omeprazole (PRILOSEC) 40 MG capsule Take 40 mg by mouth daily.  11  . topiramate (TOPAMAX) 100 MG tablet Take 1 tablet (100 mg total) by mouth at bedtime. 90 tablet 3  . trazodone (DESYREL) 300 MG tablet Take 1 tablet (300 mg total) by mouth at bedtime as needed for sleep. (Patient taking differently: Take 300 mg by mouth at bedtime. sleep) 30 tablet 0  . vitamin B-12 (CYANOCOBALAMIN) 1000 MCG tablet Take 1 tablet (1,000 mcg total) by mouth daily. 30 tablet 0  . vitamin E 100 UNIT capsule Take 100 Units by mouth daily.  11  . gabapentin (NEURONTIN) 100 MG capsule Take 100 mg by mouth daily as needed (nerve pain).   3  . meloxicam (MOBIC) 7.5 MG tablet Take 2 tablets (15 mg total) by mouth daily. (Patient not taking: Reported on 02/25/2018) 30 tablet 0  . methocarbamol (ROBAXIN) 500 MG tablet Take 1 tablet (500 mg total) by mouth 2 (two) times daily. (Patient not taking: Reported on 02/25/2018) 20 tablet 0  . mometasone (NASONEX) 50 MCG/ACT nasal spray Place 2 sprays into the nose daily. (Patient not taking: Reported on 04/19/2018) 17 g 0  . moxifloxacin (VIGAMOX) 0.5 % ophthalmic solution Place 1 drop into the left eye 4 (four) times daily.  1  .  ondansetron (ZOFRAN ODT) 4 MG disintegrating tablet Take 1 tablet (4 mg total) by mouth every 8 (eight) hours as needed for nausea or vomiting. (Patient not taking: Reported on 04/19/2018) 10 tablet 2  . ondansetron (ZOFRAN) 8 MG tablet Take 1 tablet (8 mg total) by mouth every 8 (eight) hours as needed for nausea or vomiting. (Patient not taking: Reported on 04/19/2018) 30 tablet 0  . SUMAtriptan (IMITREX) 100 MG tablet Take 50-100 mg by mouth every 2 (two) hours as needed for migraine or headache.   3  . tiZANidine (ZANAFLEX) 4 MG tablet Take 4 mg  by mouth 3 (three) times daily as needed for muscle spasms.   0  . zolpidem (AMBIEN) 10 MG tablet Take 10 mg by mouth at bedtime as needed for sleep.   2     Discharge Medications: Please see discharge summary for a list of discharge medications.  Relevant Imaging Results:  Relevant Lab Results:   Additional Information [id number]  Alphonse Guild Imaad Reuss, LCSWA

## 2018-04-30 NOTE — Progress Notes (Signed)
TTS consulted with Patriciaann Clan, PA who states the pt does not meet criteria for inpt treatment. Pt is psych cleared and PA recommends social work consult follow up and continue to work with pt as they are already involved per chart review. TTS advised the pt's nurse Rip Harbour, RN of the disposition. Attempted to contact EDP at [phone] but line continues to ring with no answer.   Lind Covert, MSW, LCSW Therapeutic Triage Specialist  [phone]

## 2018-04-30 NOTE — Progress Notes (Signed)
[facility]  Patient Status: Active (receiving services up to time of hospitalization)  [facility] is providing the following services: RN, PT, OT and MSW  If patient discharges after hours, please call [phone].   Dorothy White 04/30/2018, 2:48 PM

## 2018-04-30 NOTE — ED Notes (Signed)
Patient ambulated to CT at this time.

## 2018-04-30 NOTE — Clinical Social Work Note (Signed)
Clinical Social Work Assessment  Patient Details  Name: Dorothy White MRN: [id number] Date of Birth: 06-Apr-1951  Date of referral:  04/30/18               Reason for consult:  Facility Placement                Permission sought to share information with:  Facility Art therapist granted to share information::  Yes, Verbal Permission Granted  Name::        Agency::     Relationship::     Contact Information:     Housing/Transportation Living arrangements for the past 2 months:  Single Family Home Source of Information:  Patient Patient Interpreter Needed:  None Criminal Activity/Legal Involvement Pertinent to Current Situation/Hospitalization:    Significant Relationships:  Siblings, Friend Lives with:  Self Do you feel safe going back to the place where you live?  No Need for family participation in patient care:  No (Coment)  Care giving concerns:  2nd shift ED CSW received a handoff from the 1st shift [facility] ED CSW.   Pt is in the ED due to family/friends concerns pt has had multiple ED visits (10 in past 6 months, per the [facility] ED RN CM) with some being due to pt's complaints of falls resulting in head injuries during periods of confusion experienced by the pt.  Pt reported to others self-awareness that pt increasingly has loss of memory resulting in falls and pt herself is concerned about this and has been in the past but has always felt, "I don't want to give up, I will get better.  Pt today states she realized she needs to seek admission to a facility for her own safety stating, "I don't want to fall anymore" and is willing at this time to be admitted into a ALF/Memory Care Unit.  CSW staffed with the CSW Asst Director And the [facility] ED RN CM who agrees with the EDP that placement for pt's safety is necessary if pt lives alone and pt's family is not available to to assist pt with safety once pt is D/C'd.  CSW met with pt's brother who is in current  treatment for cancer and who states that he cannot physically care for the pt.    Pt's landlord no longer feels safe that pt can live alone in the residence owned by the pt's landlord that is rented to the pt.  Pt has a female friend who is a member of a Health visitor" in [place] who is closely involved and supportive of the pt.  Per family and pt, pt cannot private pay more than $1,000.  In addition, per Janett Billow at [facility] at ph: [phone]  and [place] at [facility] at ph: [phone], the pt will need to apply and be approved for "Special Assistance Medicaid" (pt already has MEDICAID [place]MEDICAID OF [place]) and once that is approved pt can be accepted to a [facility] ALF/Memory Care.  Of note: CSW (this Probation officer) has been told by other ALF's that they can accept pt's who have "just applied only" for  "Special Assistance Medicaid" and then pt can be accepted using the pt's "regular" Medicaid once the application has been finished.  CSW will send referrals out to other facilities.  Per the CSW Asst Director the pt will be referred out to facilities not only in [place] but in the surrounding [place] areas so that placement can take place as early as the week  of 10/7-10/12.  Pt and family are aware pt can alkways switch to a closer facility once pt is D.C'd from the [facility] ED to a facility then decides they want to move closer.    Social Worker assessment / plan:   CSW met with pt and confirmed pt's plan to be discharged to SNF or ALF Memory Care Unit (whichever is apprpriate) to live at discharge.  CSW provided active listening and validated pt's concerns.   CSW will complete FL-2 and send referrals out to SNF or ALF Memory Care facilities (whichever is appropriate) via the hub per pt's request. Pt has been living independently prior to being admitted to [facility].  Employment status:  Retired Nurse, adult, Medicaid In [place] PT Recommendations:  Not  assessed at this time [place] / Referral to community resources:     Patient/Family's Response to care:  Patient is alert and oriented.  Patient and pt's friends/family agreeable to plan.  Pt's and pt's friends/family supportive and strongly involved in pt.'s care.  Pt.'s  pleasant and appreciated CSW intervention.     Patient/Family's Understanding of and Emotional Response to Diagnosis, Current Treatment, and Prognosis:  Pt and family understand current prognosis and treatment.   Emotional Assessment Appearance:  Appears stated age Attitude/Demeanor/Rapport:    Affect (typically observed):  Accepting, Adaptable, Stoic, Pleasant Orientation:  Oriented to Self, Oriented to Place, Oriented to  Time, Oriented to Situation(Per notes pt is at times presentin with fluctuating orientation) Alcohol / Substance use:    Psych involvement (Current and /or in the community):     Discharge Needs  Concerns to be addressed:  No discharge needs identified Readmission within the last 30 days:  No Current discharge risk:  None Barriers to Discharge:  No Barriers Identified   Claudine Mouton, LCSWA 04/30/2018, 9:47 PM

## 2018-04-30 NOTE — Progress Notes (Signed)
Per [facility] ED RN CM, pt will need a  Gilford Rile described as a "Home Use only DME rolling walker with seat" or as close as possible and requests that the EDP please place orders for the above.  CM stated CM will follow up with procuring this for the pt with the help of said orders.  CSW will continue to follow for D/C needs.  Alphonse Guild. Malakhai Beitler, LCSW, LCAS, CSI Clinical Social Worker Ph: [phone]     '

## 2018-04-30 NOTE — Care Management Note (Signed)
Case Management Note  CM and CSW consulted for transition of care planning.  CM and CSW spoke at length with pt, pt's brother, pt's landlord, and pt's best friend.  Pt is requesting to go to a LTC memory unit.  HH orders are in place for skilled needs whether pt goes to SNF or ALF.  CSW is aware of pt's RN/PT/OT/SW needs and will make sure the facility receives the information.  CM facilitated chest x-ray for r/o TB for facility.  Updated Dr. Sedonia Small.  No further CM needs noted at this time.  Constance Hackenberg, Benjaman Lobe, RN 04/30/2018, 5:39 PM

## 2018-04-30 NOTE — ED Notes (Signed)
Bed: WA29 Expected date:  Expected time:  Means of arrival:  Comments: Hold for 11

## 2018-05-01 DIAGNOSIS — R413 Other amnesia: Secondary | ICD-10-CM | POA: Diagnosis not present

## 2018-05-01 MED ORDER — PANTOPRAZOLE SODIUM 40 MG PO TBEC
40.0000 mg | DELAYED_RELEASE_TABLET | Freq: Every day | ORAL | Status: DC
  Administered 2018-05-01 – 2018-05-03 (×3): 40 mg via ORAL
  Filled 2018-05-01 (×3): qty 1

## 2018-05-01 NOTE — Progress Notes (Addendum)
CSW notes patient has been accepted to [facility] via the hub. CSW attempted to reach intake staff regarding bed offer; staff stated there are no intake or admissions staff at the facility on weekends and patients are reviewed remotely.   CSW was provided a phone number for [facility] [phone], CSW left HIPPA compliant voicemail regarding bed offer requesting returned call.   UPDATE 1:00PM  CSW notes Dorothy White has also accepted patient. CSW reached out to [facility] earlier today regarding another patient and is still waiting for a returned call.   Plan is to discharge patient to first available SNF bed that can take the patient the [place], [place] Social Worker [phone]

## 2018-05-01 NOTE — Progress Notes (Addendum)
CSW received hand off from [facility] weekday CSW's and is aware of patient. CSW notes there are no SNF bed offers at this time, but will continue to follow the patient throughout the weekend and follow up with SNFs and ALFs.  CSW notes PT has evaluated the patient; CSW faxed therapy notes through [place] to SNFs for further review.  CSW following patient for placement and discharge needs.  Stephanie Acre, [place] Social Worker [phone]

## 2018-05-01 NOTE — Evaluation (Signed)
Physical Therapy Evaluation Patient Details Name: Dorothy White MRN: [id number] DOB: Mar 15, 1951 Today's Date: 05/01/2018   History of Present Illness  Pt admitted 2* confusion and with history of multiple falls.  Pt also with hx of CHF, CAD, L hip hemi-arthroplasty.  Clinical Impression  Pt admitted as above and presenting with functional mobility limitations 2* mild generalized weakness and mild ambulatory balance deficits.  This am, pt able to ambulate unassisted with one mild LOB which she self-corrected but pt is limited in ability to perform higher level balance activities without assist to prevent falling.    Follow Up Recommendations Supervision for mobility/OOB;SNF(2* history of falls)    Equipment Recommendations  None recommended by PT    Recommendations for Other Services       Precautions / Restrictions Precautions Precautions: Fall Restrictions Weight Bearing Restrictions: No      Mobility  Bed Mobility Overal bed mobility: Modified Independent             General bed mobility comments: Pt unassisted to/from bed  Transfers Overall transfer level: Needs assistance Equipment used: None Transfers: Sit to/from Stand Sit to Stand: Min guard         General transfer comment: steady assist for initial standing  Ambulation/Gait Ambulation/Gait assistance: Min guard;Supervision Gait Distance (Feet): 150 Feet Assistive device: None;1 person hand held assist Gait Pattern/deviations: Step-through pattern;Shuffle Gait velocity: mod pace   General Gait Details: Pt ambulating unassisted with one episode mild balance loss but self corrected.  Stairs            Wheelchair Mobility    Modified Rankin (Stroke Patients Only)       Balance Overall balance assessment: Needs assistance Sitting-balance support: No upper extremity supported;Feet supported Sitting balance-Leahy Scale: Normal     Standing balance support: No upper extremity  supported Standing balance-Leahy Scale: Good   Single Leg Stance - Right Leg: 0(pt unable to perform) Single Leg Stance - Left Leg: 0(pt unable to perform)           High Level Balance Comments: Pt able to step side ways with no difficulty, mild difficulty stepping bkwd, assist needed to tandem step and unable to stork stand             Pertinent Vitals/Pain Pain Assessment: No/denies pain    Home Living Family/patient expects to be discharged to:: Skilled nursing facility Living Arrangements: Non-relatives/Friends               Additional Comments: Pt states she lives with a roommate    Prior Function Level of Independence: Independent         Comments: Pt states [name]     Hand Dominance        Extremity/Trunk Assessment   Upper Extremity Assessment Upper Extremity Assessment: Generalized weakness    Lower Extremity Assessment Lower Extremity Assessment: Overall WFL for tasks assessed    Cervical / Trunk Assessment Cervical / Trunk Assessment: Normal  Communication   Communication: No difficulties  Cognition Arousal/Alertness: Awake/alert Behavior During Therapy: Flat affect;WFL for tasks assessed/performed Overall Cognitive Status: Within Functional Limits for tasks assessed                                 General Comments: Pt very cooperative and answering all questions appropriately - oriented x4 at this time  General Comments      Exercises     Assessment/Plan    PT Assessment Patient needs continued PT services  PT Problem List Decreased balance       PT Treatment Interventions Gait training;Balance training;DME instruction    PT Goals (Current goals can be found in the Care Plan section)  Acute Rehab PT Goals Patient Stated Goal: No specific goals expressed PT Goal Formulation: With patient Time For Goal Achievement:  05/15/18 Potential to Achieve Goals: Good    Frequency Min 2X/week   Barriers to discharge Decreased caregiver support      Co-evaluation               AM-PAC PT "6 Clicks" Daily Activity  Outcome Measure Difficulty turning over in bed (including adjusting bedclothes, sheets and blankets)?: None Difficulty moving from lying on back to sitting on the side of the bed? : None Difficulty sitting down on and standing up from a chair with arms (e.g., wheelchair, bedside commode, etc,.)?: A Little Help needed moving to and from a bed to chair (including a wheelchair)?: A Little Help needed walking in hospital room?: A Little Help needed climbing 3-5 steps with a railing? : A Little 6 Click Score: 20    End of Session Equipment Utilized During Treatment: Gait belt Activity Tolerance: Patient tolerated treatment well Patient left: in bed;with call bell/phone within reach Nurse Communication: Mobility status PT Visit Diagnosis: History of falling (Z91.81)    Time: 7741-4239 PT Time Calculation (min) (ACUTE ONLY): 22 min   Charges:   PT Evaluation $PT Eval Low Complexity: [place] PT Acute Rehabilitation Services Pager [phone] Office [phone]   Kalana Yust,Limb 05/01/2018, 9:13 AM

## 2018-05-01 NOTE — ED Notes (Signed)
Patient given prn pain medication for a headache.

## 2018-05-02 DIAGNOSIS — R413 Other amnesia: Secondary | ICD-10-CM | POA: Diagnosis not present

## 2018-05-02 IMAGING — CT CT ABD-PELV W/ CM
2 of 5 series · 15 of 46 positions shown, 17 images · IV contrast (ISOVUE 300)
Comparison: 03/18/2016, 03/13/2016, 03/12/2016

CLINICAL DATA: Follow-up drain removal for diverticular abscess.

EXAM:
CT ABDOMEN AND PELVIS WITH CONTRAST
TECHNIQUE: Multidetector CT imaging of the abdomen and pelvis was performed
using the standard protocol following bolus administration of
intravenous contrast.
CONTRAST:  100mL AKIIYI-IKK IOPAMIDOL (AKIIYI-IKK) INJECTION 61%

[Series 2: abd/ pelvis · axial · 0.69mm/px · z∈[-453,-43]mm · 12 of 92 slices shown, 14 images]
[im 5/92  soft-tissue]
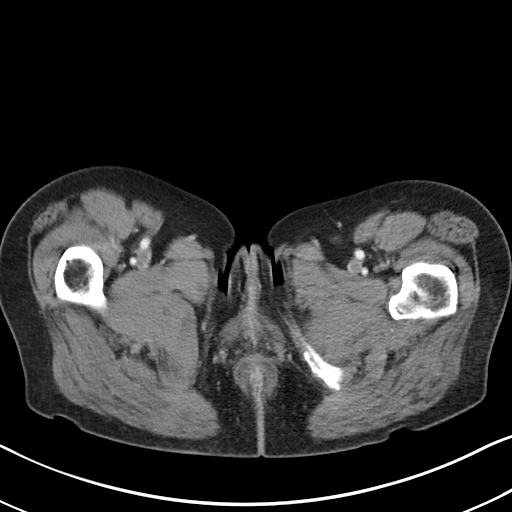
[im 5/92  bone]
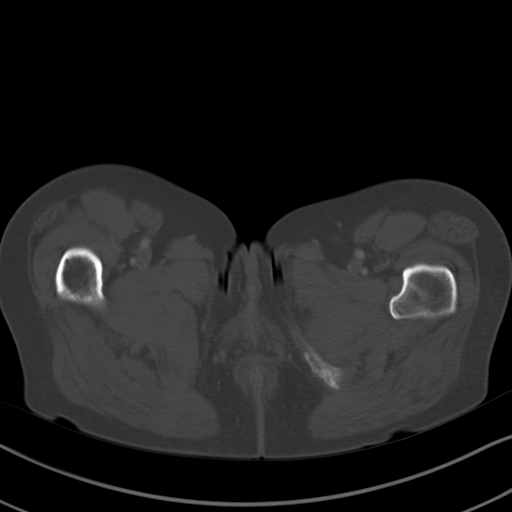
[im 14/92  soft-tissue]
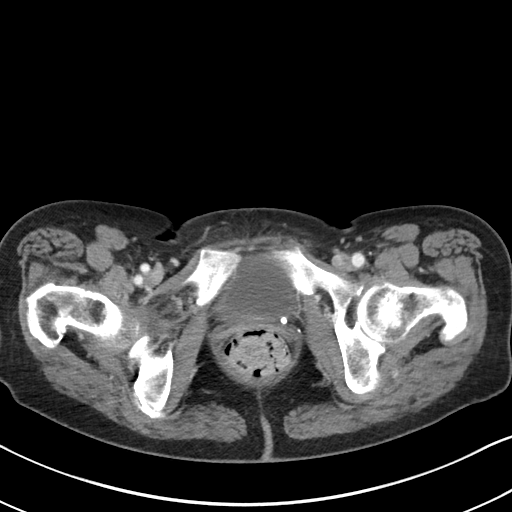
[im 19/92  soft-tissue]
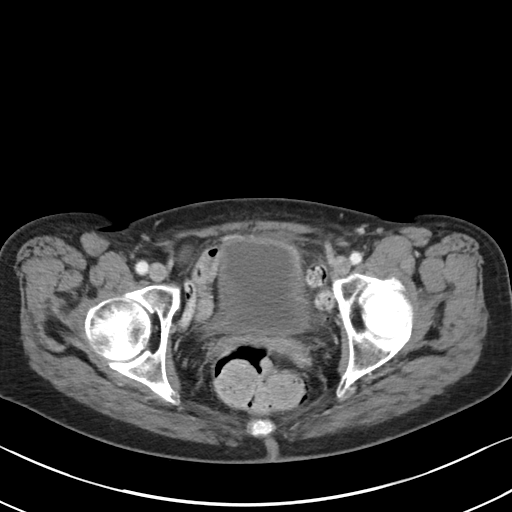
[im 28/92  soft-tissue]
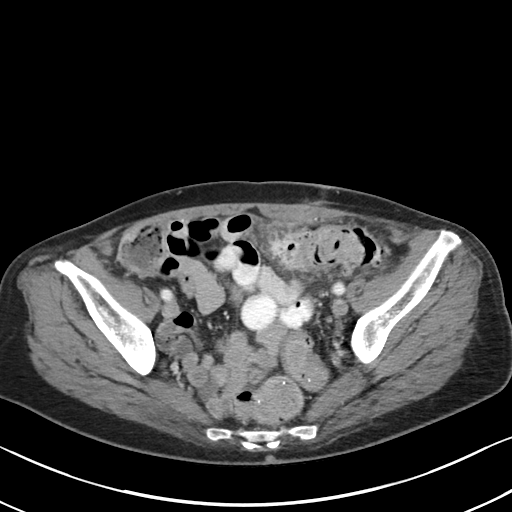
[im 37/92  soft-tissue]
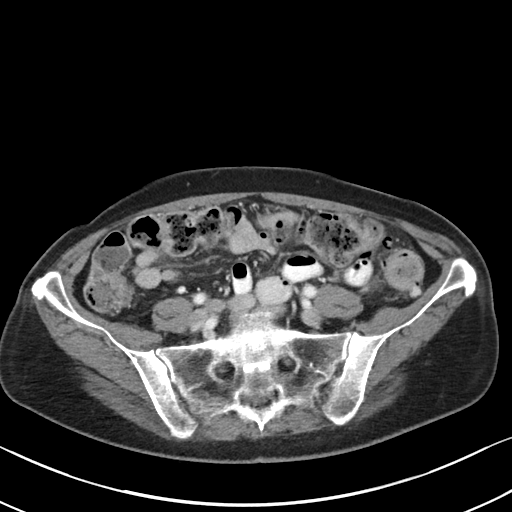
[im 41/92  soft-tissue]
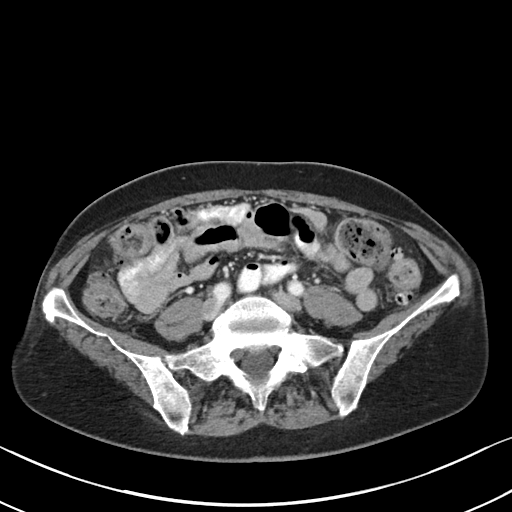
[im 51/92  soft-tissue]
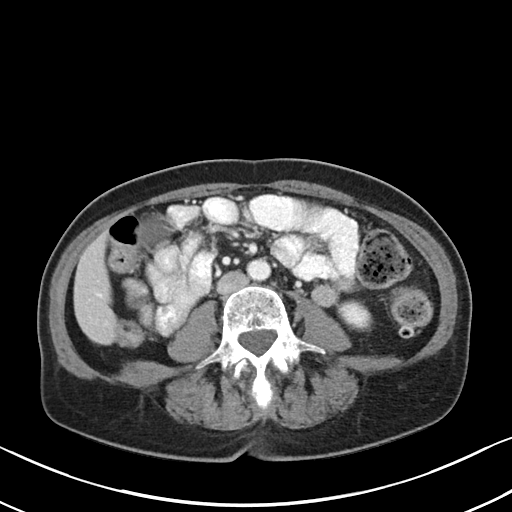
[im 55/92  soft-tissue]
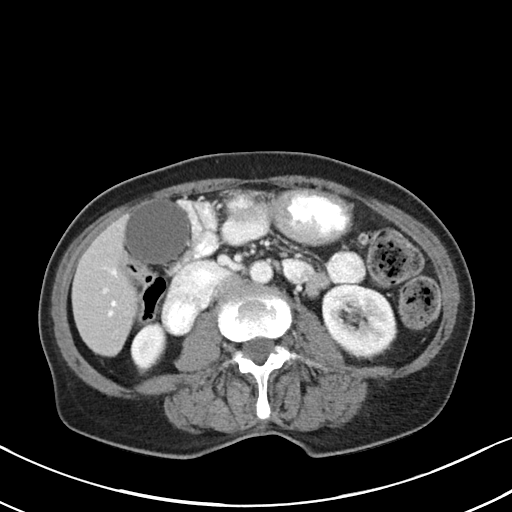
[im 64/92  soft-tissue]
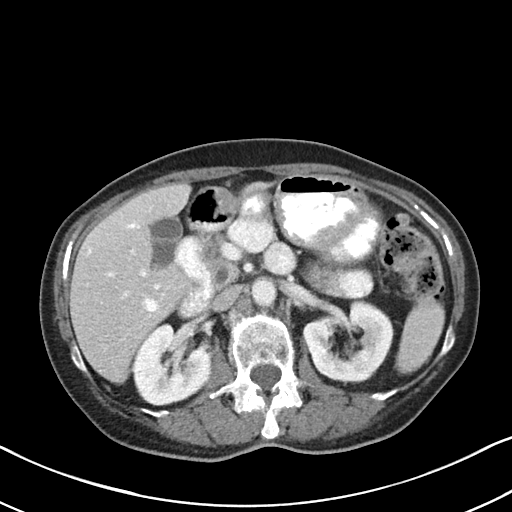
[im 64/92  bone]
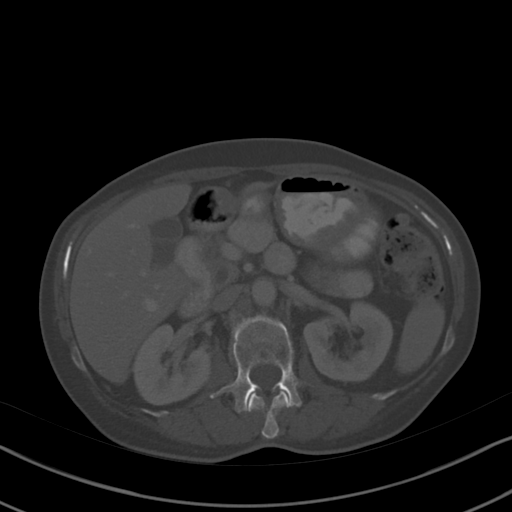
[im 73/92  soft-tissue]
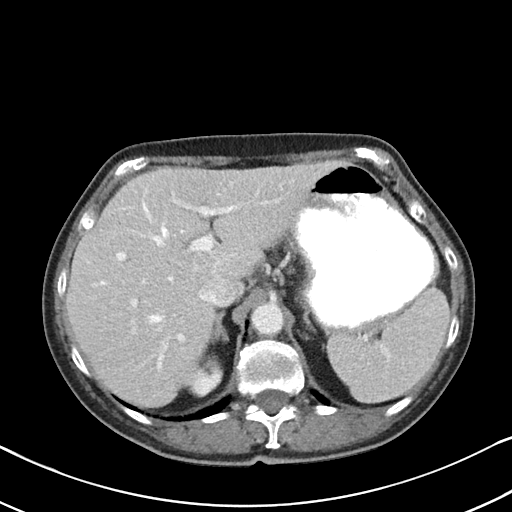
[im 78/92  soft-tissue]
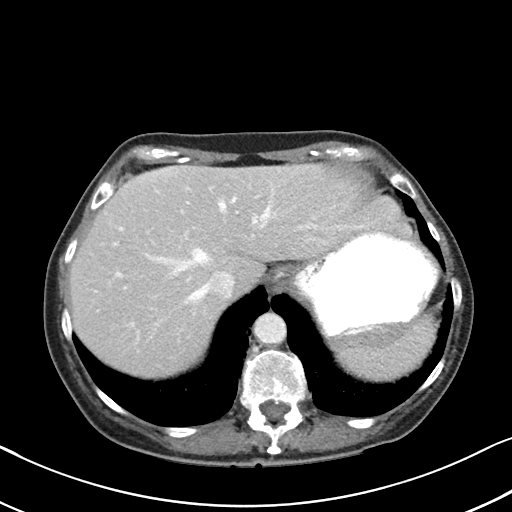
[im 87/92  soft-tissue]
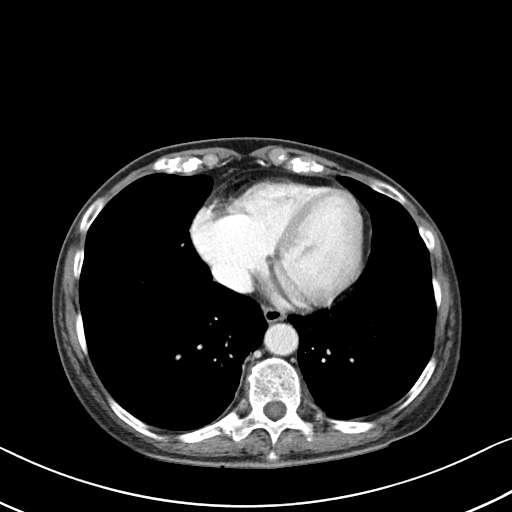

[Series 5: coronal soft tissue · coronal · 0.59mm/px · 3 of 72 slices shown]
[im 24/72  soft-tissue]
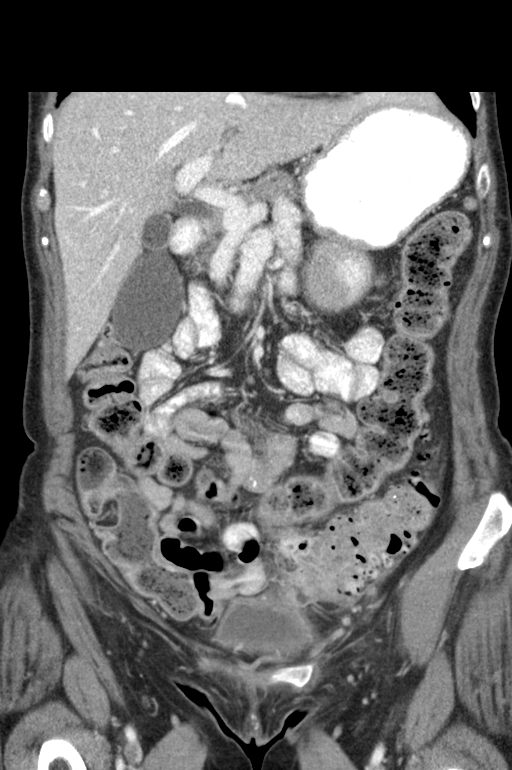
[im 32/72  soft-tissue]
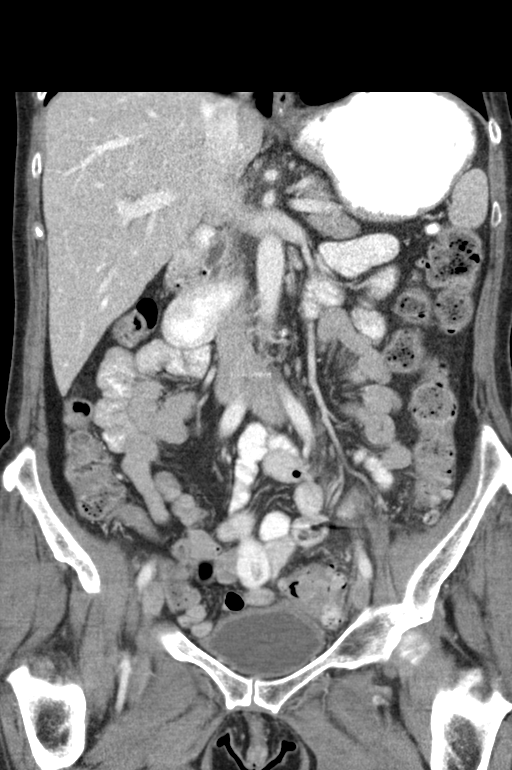
[im 40/72  soft-tissue]
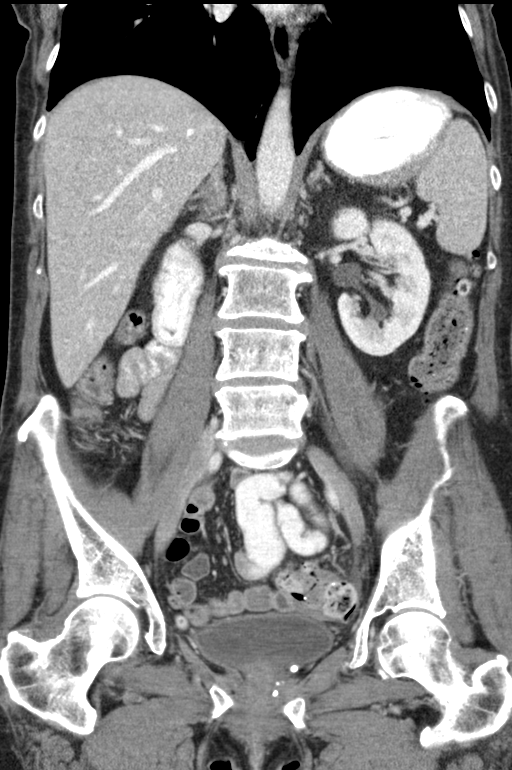

[15 of 46 positions shown; findings below may reference images not displayed]

FINDINGS: Lower chest:  No acute findings.

Hepatobiliary: No masses or other significant abnormality.

Pancreas: No mass, inflammatory changes, or other significant
abnormality.

Spleen: Within normal limits in size and appearance.

Adrenals/Urinary Tract: No masses identified. Small 8 mm left upper
pole renal cyst. No urolithiasis. No evidence of hydronephrosis.

Stomach/Bowel: No bowel dilatation to suggest obstruction. No
pneumatosis, pneumoperitoneum or portal venous gas. Diverticulosis
of the sigmoid colon. Small focal area of inflammatory changes
adjacent to the sigmoid colon at the site of previously drained
abscess likely reflecting a small resolving phlegmon. No drainable
fluid collection.

Vascular/Lymphatic: No pathologically enlarged lymph nodes. No
evidence of abdominal aortic aneurysm

Reproductive: No mass or other significant abnormality.

Musculoskeletal: No suspicious bone lesions identified.

Other: None.
IMPRESSION: 1. Small focal area of inflammatory changes adjacent to the sigmoid
colon at the site of previously drained abscess likely reflecting a
small resolving phlegmon. No focal drainable fluid collection to
suggest a persistent abscess. Mild persistent resolving sigmoid
diverticulitis.

## 2018-05-02 MED ORDER — LOPERAMIDE HCL 2 MG PO CAPS
2.0000 mg | ORAL_CAPSULE | Freq: Once | ORAL | Status: AC
  Administered 2018-05-02: 2 mg via ORAL
  Filled 2018-05-02: qty 1

## 2018-05-02 NOTE — Progress Notes (Addendum)
CSW faxed pt's referral at the verbal request of the pt and pt's family to ALF's in the greater [place] area and the surrounding area via the hub.  Pt/pt's family voiced understanding the pt may be referred and accepted to a facility far away from home but pt is agreeable at this time anyway.  CSW called Carlean Purl in admissions at [facility] at ph: [phone] and left a HIPPA-comliant VM requesting a call back. [facility] does havee a locked unit.  CSW sent referrals out for long-term SNF (with a locked unit) placement to facilities with locked units with the verbal permission pf the pt/pt's family on Friday 10/4 with the knowledge and understanding that the pt's Medicaid of [place] is sufficient for long-term SNF(locked unit) residential long-term care.  It is the CSW's understanding that for ALF/Memory Care the pt must at very least have "Special Assistance Medicaid" having been applied for, for an ALF facility to consider placement because in the past ALF facilities have accepted and taken a pt in as long as Special Assistance Medicaid was "applied for"  Other ALF facilities have said pt has to "HAVE" special assistance Medicaid already for acceptance.  CSW spoke to the [facility] [place] leadership who advocated for SNF long-term residential stay placement intially.  CSW will continue to follow for D/C needs.  Alphonse Guild. Thoms Barthelemy, LCSW, LCAS, CSI Clinical Social Worker Ph: [phone]         ]

## 2018-05-02 NOTE — Progress Notes (Signed)
CSW continuing to follow patient for placement and discharge needs.  CSW notes no additional bed offers in the HUB from yesterday and attempted to follow up with [facility] SNF. CSW contacted [facility] [phone],[facility] HIPPA compliant voicemail regarding bed offer requesting returned call.  CSW attempted to reach [place] at [facility] regarding SNF acceptance. CSW left a voicemail requesting a returned call.   Stephanie Acre, [place] Social Worker [phone]

## 2018-05-02 NOTE — ED Notes (Signed)
Sitting quielty watching tv.  Pt alert/oriented, pleasant.  Pt reports that she lives alone and can no longer stay there on her own.  Pt reports multiple falls, increasing forgetfullness, is unable to fix her own food, and loss of appetite.  Pt denies si/hi/avh at this time.

## 2018-05-02 NOTE — ED Notes (Signed)
Pt stated "I just forget things.  I know what day it is.  My friends tell me I forget a lot.  I'm going to a [place] living or something."    Pt is A&O x 4.

## 2018-05-02 NOTE — ED Notes (Signed)
Reports neck pain has returned, hot pack given, repositioned.

## 2018-05-02 NOTE — ED Notes (Signed)
Pt reports diarrhea, reports it is a chronic problem that occurrs after eating, EDP updated, orders received.

## 2018-05-03 DIAGNOSIS — R41841 Cognitive communication deficit: Secondary | ICD-10-CM | POA: Diagnosis not present

## 2018-05-03 DIAGNOSIS — R2681 Unsteadiness on feet: Secondary | ICD-10-CM | POA: Diagnosis not present

## 2018-05-03 DIAGNOSIS — I69811 Memory deficit following other cerebrovascular disease: Secondary | ICD-10-CM | POA: Diagnosis not present

## 2018-05-03 DIAGNOSIS — M6281 Muscle weakness (generalized): Secondary | ICD-10-CM | POA: Diagnosis not present

## 2018-05-03 DIAGNOSIS — R413 Other amnesia: Secondary | ICD-10-CM | POA: Diagnosis not present

## 2018-05-03 DIAGNOSIS — G43401 Hemiplegic migraine, not intractable, with status migrainosus: Secondary | ICD-10-CM | POA: Diagnosis not present

## 2018-05-03 DIAGNOSIS — R2689 Other abnormalities of gait and mobility: Secondary | ICD-10-CM | POA: Diagnosis not present

## 2018-05-03 DIAGNOSIS — I2510 Atherosclerotic heart disease of native coronary artery without angina pectoris: Secondary | ICD-10-CM | POA: Diagnosis not present

## 2018-05-03 MED ORDER — LOPERAMIDE HCL 2 MG PO CAPS
2.0000 mg | ORAL_CAPSULE | Freq: Every day | ORAL | Status: DC
  Administered 2018-05-03: 2 mg via ORAL
  Filled 2018-05-03: qty 1

## 2018-05-03 NOTE — Progress Notes (Addendum)
CSW received handoff from weekend CSWs. Per notes, patient has been referred out to various Memory Care SNFs/ALFs. CSW aware patient has received bed offers from [facility], [facility] and [facility]. To CSW's knowledge, only [place] has a Memory Care unit. CSW has reached out to [place], admissions with [facility] [phone], regarding if they can accept patient today. CSW left voicemail for return call.  10:26am- CSW received return call from [facility] regarding patient. Per [facility] is able to take patient into their Memory Care Unit. CSW spoke with patient and patient's brother at bedside who are agreeable to go to [facility]. CSW explained that patient does not have to stay there long term and can transition once her special assistance medicaid kicks in. Patient's brother requested information regarding special assistance medicaid and if we had the paperwork. CSW advised brother that he would need to go to [place] to fill out the paperwork.   Ollen Barges, [place] Work Department  [employer]  [phone]

## 2018-05-03 NOTE — Progress Notes (Addendum)
Patient has been accepted to [facility] for today 10/7. Patient, RN and EDP agreeable to discharge plan. CSW spoke with patient and patient's brother at bedside regarding discharge plans. Please call report to [phone] and ask for [facility]. Per patient's brother, he would like to transport patient to facility. CSW has updated Bethena Roys with [facility] [employer].   No further CSW needs at this time. Please reconsult if needs arise.  Ollen Barges, [place] Work Department  [employer]  [phone]

## 2018-05-04 DIAGNOSIS — M6281 Muscle weakness (generalized): Secondary | ICD-10-CM | POA: Diagnosis not present

## 2018-05-04 DIAGNOSIS — R2681 Unsteadiness on feet: Secondary | ICD-10-CM | POA: Diagnosis not present

## 2018-05-04 DIAGNOSIS — I2510 Atherosclerotic heart disease of native coronary artery without angina pectoris: Secondary | ICD-10-CM | POA: Diagnosis not present

## 2018-05-04 DIAGNOSIS — I69811 Memory deficit following other cerebrovascular disease: Secondary | ICD-10-CM | POA: Diagnosis not present

## 2018-05-04 DIAGNOSIS — G43401 Hemiplegic migraine, not intractable, with status migrainosus: Secondary | ICD-10-CM | POA: Diagnosis not present

## 2018-05-04 DIAGNOSIS — R2689 Other abnormalities of gait and mobility: Secondary | ICD-10-CM | POA: Diagnosis not present

## 2018-05-04 DIAGNOSIS — R41841 Cognitive communication deficit: Secondary | ICD-10-CM | POA: Diagnosis not present

## 2018-05-05 DIAGNOSIS — M6281 Muscle weakness (generalized): Secondary | ICD-10-CM | POA: Diagnosis not present

## 2018-05-05 DIAGNOSIS — G43401 Hemiplegic migraine, not intractable, with status migrainosus: Secondary | ICD-10-CM | POA: Diagnosis not present

## 2018-05-05 DIAGNOSIS — I69811 Memory deficit following other cerebrovascular disease: Secondary | ICD-10-CM | POA: Diagnosis not present

## 2018-05-05 DIAGNOSIS — R2681 Unsteadiness on feet: Secondary | ICD-10-CM | POA: Diagnosis not present

## 2018-05-05 DIAGNOSIS — I2510 Atherosclerotic heart disease of native coronary artery without angina pectoris: Secondary | ICD-10-CM | POA: Diagnosis not present

## 2018-05-05 DIAGNOSIS — R2689 Other abnormalities of gait and mobility: Secondary | ICD-10-CM | POA: Diagnosis not present

## 2018-05-05 DIAGNOSIS — R41841 Cognitive communication deficit: Secondary | ICD-10-CM | POA: Diagnosis not present

## 2018-05-06 DIAGNOSIS — D649 Anemia, unspecified: Secondary | ICD-10-CM | POA: Diagnosis not present

## 2018-05-06 DIAGNOSIS — I639 Cerebral infarction, unspecified: Secondary | ICD-10-CM | POA: Diagnosis not present

## 2018-05-06 DIAGNOSIS — M6281 Muscle weakness (generalized): Secondary | ICD-10-CM | POA: Diagnosis not present

## 2018-05-06 DIAGNOSIS — I2510 Atherosclerotic heart disease of native coronary artery without angina pectoris: Secondary | ICD-10-CM | POA: Diagnosis not present

## 2018-05-06 DIAGNOSIS — G43401 Hemiplegic migraine, not intractable, with status migrainosus: Secondary | ICD-10-CM | POA: Diagnosis not present

## 2018-05-06 DIAGNOSIS — M47816 Spondylosis without myelopathy or radiculopathy, lumbar region: Secondary | ICD-10-CM | POA: Diagnosis not present

## 2018-05-06 DIAGNOSIS — I69811 Memory deficit following other cerebrovascular disease: Secondary | ICD-10-CM | POA: Diagnosis not present

## 2018-05-06 DIAGNOSIS — F0390 Unspecified dementia without behavioral disturbance: Secondary | ICD-10-CM | POA: Diagnosis not present

## 2018-05-06 DIAGNOSIS — K5900 Constipation, unspecified: Secondary | ICD-10-CM | POA: Diagnosis not present

## 2018-05-06 DIAGNOSIS — G43909 Migraine, unspecified, not intractable, without status migrainosus: Secondary | ICD-10-CM | POA: Diagnosis not present

## 2018-05-06 DIAGNOSIS — F322 Major depressive disorder, single episode, severe without psychotic features: Secondary | ICD-10-CM | POA: Diagnosis not present

## 2018-05-06 DIAGNOSIS — R41841 Cognitive communication deficit: Secondary | ICD-10-CM | POA: Diagnosis not present

## 2018-05-06 DIAGNOSIS — K219 Gastro-esophageal reflux disease without esophagitis: Secondary | ICD-10-CM | POA: Diagnosis not present

## 2018-05-06 DIAGNOSIS — R2689 Other abnormalities of gait and mobility: Secondary | ICD-10-CM | POA: Diagnosis not present

## 2018-05-06 DIAGNOSIS — R2681 Unsteadiness on feet: Secondary | ICD-10-CM | POA: Diagnosis not present

## 2018-05-07 DIAGNOSIS — R2681 Unsteadiness on feet: Secondary | ICD-10-CM | POA: Diagnosis not present

## 2018-05-07 DIAGNOSIS — M6281 Muscle weakness (generalized): Secondary | ICD-10-CM | POA: Diagnosis not present

## 2018-05-07 DIAGNOSIS — R2689 Other abnormalities of gait and mobility: Secondary | ICD-10-CM | POA: Diagnosis not present

## 2018-05-07 DIAGNOSIS — R41841 Cognitive communication deficit: Secondary | ICD-10-CM | POA: Diagnosis not present

## 2018-05-07 DIAGNOSIS — G43401 Hemiplegic migraine, not intractable, with status migrainosus: Secondary | ICD-10-CM | POA: Diagnosis not present

## 2018-05-07 DIAGNOSIS — I69811 Memory deficit following other cerebrovascular disease: Secondary | ICD-10-CM | POA: Diagnosis not present

## 2018-05-07 DIAGNOSIS — I2510 Atherosclerotic heart disease of native coronary artery without angina pectoris: Secondary | ICD-10-CM | POA: Diagnosis not present

## 2018-05-08 DIAGNOSIS — M6281 Muscle weakness (generalized): Secondary | ICD-10-CM | POA: Diagnosis not present

## 2018-05-08 DIAGNOSIS — R2689 Other abnormalities of gait and mobility: Secondary | ICD-10-CM | POA: Diagnosis not present

## 2018-05-08 DIAGNOSIS — G43401 Hemiplegic migraine, not intractable, with status migrainosus: Secondary | ICD-10-CM | POA: Diagnosis not present

## 2018-05-08 DIAGNOSIS — I2510 Atherosclerotic heart disease of native coronary artery without angina pectoris: Secondary | ICD-10-CM | POA: Diagnosis not present

## 2018-05-08 DIAGNOSIS — R2681 Unsteadiness on feet: Secondary | ICD-10-CM | POA: Diagnosis not present

## 2018-05-08 DIAGNOSIS — I69811 Memory deficit following other cerebrovascular disease: Secondary | ICD-10-CM | POA: Diagnosis not present

## 2018-05-08 DIAGNOSIS — R41841 Cognitive communication deficit: Secondary | ICD-10-CM | POA: Diagnosis not present

## 2018-05-10 DIAGNOSIS — I2510 Atherosclerotic heart disease of native coronary artery without angina pectoris: Secondary | ICD-10-CM | POA: Diagnosis not present

## 2018-05-10 DIAGNOSIS — I69811 Memory deficit following other cerebrovascular disease: Secondary | ICD-10-CM | POA: Diagnosis not present

## 2018-05-10 DIAGNOSIS — M6281 Muscle weakness (generalized): Secondary | ICD-10-CM | POA: Diagnosis not present

## 2018-05-10 DIAGNOSIS — R41841 Cognitive communication deficit: Secondary | ICD-10-CM | POA: Diagnosis not present

## 2018-05-10 DIAGNOSIS — R2689 Other abnormalities of gait and mobility: Secondary | ICD-10-CM | POA: Diagnosis not present

## 2018-05-10 DIAGNOSIS — G43401 Hemiplegic migraine, not intractable, with status migrainosus: Secondary | ICD-10-CM | POA: Diagnosis not present

## 2018-05-10 DIAGNOSIS — R2681 Unsteadiness on feet: Secondary | ICD-10-CM | POA: Diagnosis not present

## 2018-05-11 DIAGNOSIS — I2510 Atherosclerotic heart disease of native coronary artery without angina pectoris: Secondary | ICD-10-CM | POA: Diagnosis not present

## 2018-05-11 DIAGNOSIS — R2681 Unsteadiness on feet: Secondary | ICD-10-CM | POA: Diagnosis not present

## 2018-05-11 DIAGNOSIS — I69811 Memory deficit following other cerebrovascular disease: Secondary | ICD-10-CM | POA: Diagnosis not present

## 2018-05-11 DIAGNOSIS — R41841 Cognitive communication deficit: Secondary | ICD-10-CM | POA: Diagnosis not present

## 2018-05-11 DIAGNOSIS — R2689 Other abnormalities of gait and mobility: Secondary | ICD-10-CM | POA: Diagnosis not present

## 2018-05-11 DIAGNOSIS — M6281 Muscle weakness (generalized): Secondary | ICD-10-CM | POA: Diagnosis not present

## 2018-05-11 DIAGNOSIS — G43401 Hemiplegic migraine, not intractable, with status migrainosus: Secondary | ICD-10-CM | POA: Diagnosis not present

## 2018-05-12 DIAGNOSIS — M6281 Muscle weakness (generalized): Secondary | ICD-10-CM | POA: Diagnosis not present

## 2018-05-12 DIAGNOSIS — R41841 Cognitive communication deficit: Secondary | ICD-10-CM | POA: Diagnosis not present

## 2018-05-12 DIAGNOSIS — G43401 Hemiplegic migraine, not intractable, with status migrainosus: Secondary | ICD-10-CM | POA: Diagnosis not present

## 2018-05-12 DIAGNOSIS — I2510 Atherosclerotic heart disease of native coronary artery without angina pectoris: Secondary | ICD-10-CM | POA: Diagnosis not present

## 2018-05-12 DIAGNOSIS — R2681 Unsteadiness on feet: Secondary | ICD-10-CM | POA: Diagnosis not present

## 2018-05-12 DIAGNOSIS — R2689 Other abnormalities of gait and mobility: Secondary | ICD-10-CM | POA: Diagnosis not present

## 2018-05-12 DIAGNOSIS — I69811 Memory deficit following other cerebrovascular disease: Secondary | ICD-10-CM | POA: Diagnosis not present

## 2018-05-13 DIAGNOSIS — I69811 Memory deficit following other cerebrovascular disease: Secondary | ICD-10-CM | POA: Diagnosis not present

## 2018-05-13 DIAGNOSIS — I2510 Atherosclerotic heart disease of native coronary artery without angina pectoris: Secondary | ICD-10-CM | POA: Diagnosis not present

## 2018-05-13 DIAGNOSIS — R2681 Unsteadiness on feet: Secondary | ICD-10-CM | POA: Diagnosis not present

## 2018-05-13 DIAGNOSIS — M6281 Muscle weakness (generalized): Secondary | ICD-10-CM | POA: Diagnosis not present

## 2018-05-13 DIAGNOSIS — R41841 Cognitive communication deficit: Secondary | ICD-10-CM | POA: Diagnosis not present

## 2018-05-13 DIAGNOSIS — G43401 Hemiplegic migraine, not intractable, with status migrainosus: Secondary | ICD-10-CM | POA: Diagnosis not present

## 2018-05-13 DIAGNOSIS — R2689 Other abnormalities of gait and mobility: Secondary | ICD-10-CM | POA: Diagnosis not present

## 2018-05-14 DIAGNOSIS — G43401 Hemiplegic migraine, not intractable, with status migrainosus: Secondary | ICD-10-CM | POA: Diagnosis not present

## 2018-05-14 DIAGNOSIS — R2689 Other abnormalities of gait and mobility: Secondary | ICD-10-CM | POA: Diagnosis not present

## 2018-05-14 DIAGNOSIS — I2510 Atherosclerotic heart disease of native coronary artery without angina pectoris: Secondary | ICD-10-CM | POA: Diagnosis not present

## 2018-05-14 DIAGNOSIS — R41841 Cognitive communication deficit: Secondary | ICD-10-CM | POA: Diagnosis not present

## 2018-05-14 DIAGNOSIS — M6281 Muscle weakness (generalized): Secondary | ICD-10-CM | POA: Diagnosis not present

## 2018-05-14 DIAGNOSIS — I69811 Memory deficit following other cerebrovascular disease: Secondary | ICD-10-CM | POA: Diagnosis not present

## 2018-05-14 DIAGNOSIS — R2681 Unsteadiness on feet: Secondary | ICD-10-CM | POA: Diagnosis not present

## 2018-05-17 DIAGNOSIS — R2681 Unsteadiness on feet: Secondary | ICD-10-CM | POA: Diagnosis not present

## 2018-05-17 DIAGNOSIS — R2689 Other abnormalities of gait and mobility: Secondary | ICD-10-CM | POA: Diagnosis not present

## 2018-05-17 DIAGNOSIS — G43401 Hemiplegic migraine, not intractable, with status migrainosus: Secondary | ICD-10-CM | POA: Diagnosis not present

## 2018-05-17 DIAGNOSIS — H2512 Age-related nuclear cataract, left eye: Secondary | ICD-10-CM | POA: Diagnosis not present

## 2018-05-17 DIAGNOSIS — I2510 Atherosclerotic heart disease of native coronary artery without angina pectoris: Secondary | ICD-10-CM | POA: Diagnosis not present

## 2018-05-17 DIAGNOSIS — M6281 Muscle weakness (generalized): Secondary | ICD-10-CM | POA: Diagnosis not present

## 2018-05-17 DIAGNOSIS — R41841 Cognitive communication deficit: Secondary | ICD-10-CM | POA: Diagnosis not present

## 2018-05-17 DIAGNOSIS — I69811 Memory deficit following other cerebrovascular disease: Secondary | ICD-10-CM | POA: Diagnosis not present

## 2018-05-18 DIAGNOSIS — R2689 Other abnormalities of gait and mobility: Secondary | ICD-10-CM | POA: Diagnosis not present

## 2018-05-18 DIAGNOSIS — H2511 Age-related nuclear cataract, right eye: Secondary | ICD-10-CM | POA: Diagnosis not present

## 2018-05-18 DIAGNOSIS — R2681 Unsteadiness on feet: Secondary | ICD-10-CM | POA: Diagnosis not present

## 2018-05-18 DIAGNOSIS — I2510 Atherosclerotic heart disease of native coronary artery without angina pectoris: Secondary | ICD-10-CM | POA: Diagnosis not present

## 2018-05-18 DIAGNOSIS — G43401 Hemiplegic migraine, not intractable, with status migrainosus: Secondary | ICD-10-CM | POA: Diagnosis not present

## 2018-05-18 DIAGNOSIS — M6281 Muscle weakness (generalized): Secondary | ICD-10-CM | POA: Diagnosis not present

## 2018-05-18 DIAGNOSIS — I69811 Memory deficit following other cerebrovascular disease: Secondary | ICD-10-CM | POA: Diagnosis not present

## 2018-05-18 DIAGNOSIS — R41841 Cognitive communication deficit: Secondary | ICD-10-CM | POA: Diagnosis not present

## 2018-05-19 DIAGNOSIS — M6281 Muscle weakness (generalized): Secondary | ICD-10-CM | POA: Diagnosis not present

## 2018-05-19 DIAGNOSIS — I69811 Memory deficit following other cerebrovascular disease: Secondary | ICD-10-CM | POA: Diagnosis not present

## 2018-05-19 DIAGNOSIS — R2689 Other abnormalities of gait and mobility: Secondary | ICD-10-CM | POA: Diagnosis not present

## 2018-05-19 DIAGNOSIS — I2510 Atherosclerotic heart disease of native coronary artery without angina pectoris: Secondary | ICD-10-CM | POA: Diagnosis not present

## 2018-05-19 DIAGNOSIS — R41841 Cognitive communication deficit: Secondary | ICD-10-CM | POA: Diagnosis not present

## 2018-05-19 DIAGNOSIS — G43401 Hemiplegic migraine, not intractable, with status migrainosus: Secondary | ICD-10-CM | POA: Diagnosis not present

## 2018-05-19 DIAGNOSIS — R2681 Unsteadiness on feet: Secondary | ICD-10-CM | POA: Diagnosis not present

## 2018-06-27 ENCOUNTER — Encounter (HOSPITAL_COMMUNITY): Payer: Self-pay

## 2018-06-27 ENCOUNTER — Emergency Department (HOSPITAL_COMMUNITY): Payer: Medicare Other

## 2018-06-27 ENCOUNTER — Emergency Department (HOSPITAL_COMMUNITY)
Admission: EM | Admit: 2018-06-27 | Discharge: 2018-06-27 | Discharge status: Against Medical Advice | Payer: Medicare Other | Attending: Emergency Medicine | Admitting: Emergency Medicine

## 2018-06-27 ENCOUNTER — Ambulatory Visit (HOSPITAL_COMMUNITY): Admission: RE | Admit: 2018-06-27 | Payer: Medicare Other | Source: Ambulatory Visit

## 2018-06-27 DIAGNOSIS — H5500 Unspecified nystagmus: Secondary | ICD-10-CM | POA: Diagnosis not present

## 2018-06-27 DIAGNOSIS — W108XXA Fall (on) (from) other stairs and steps, initial encounter: Secondary | ICD-10-CM | POA: Diagnosis not present

## 2018-06-27 DIAGNOSIS — I2510 Atherosclerotic heart disease of native coronary artery without angina pectoris: Secondary | ICD-10-CM | POA: Diagnosis not present

## 2018-06-27 DIAGNOSIS — F17210 Nicotine dependence, cigarettes, uncomplicated: Secondary | ICD-10-CM | POA: Insufficient documentation

## 2018-06-27 DIAGNOSIS — G44319 Acute post-traumatic headache, not intractable: Secondary | ICD-10-CM | POA: Diagnosis not present

## 2018-06-27 DIAGNOSIS — Y999 Unspecified external cause status: Secondary | ICD-10-CM | POA: Insufficient documentation

## 2018-06-27 DIAGNOSIS — R296 Repeated falls: Secondary | ICD-10-CM | POA: Diagnosis not present

## 2018-06-27 DIAGNOSIS — Z79899 Other long term (current) drug therapy: Secondary | ICD-10-CM | POA: Diagnosis not present

## 2018-06-27 DIAGNOSIS — Y929 Unspecified place or not applicable: Secondary | ICD-10-CM | POA: Diagnosis not present

## 2018-06-27 DIAGNOSIS — S0990XA Unspecified injury of head, initial encounter: Secondary | ICD-10-CM | POA: Diagnosis present

## 2018-06-27 DIAGNOSIS — Y9301 Activity, walking, marching and hiking: Secondary | ICD-10-CM | POA: Diagnosis not present

## 2018-06-27 DIAGNOSIS — S0083XA Contusion of other part of head, initial encounter: Secondary | ICD-10-CM | POA: Insufficient documentation

## 2018-06-27 DIAGNOSIS — S80211A Abrasion, right knee, initial encounter: Secondary | ICD-10-CM | POA: Insufficient documentation

## 2018-06-27 DIAGNOSIS — Z8673 Personal history of transient ischemic attack (TIA), and cerebral infarction without residual deficits: Secondary | ICD-10-CM | POA: Insufficient documentation

## 2018-06-27 MED ORDER — ACETAMINOPHEN 500 MG PO TABS
1000.0000 mg | ORAL_TABLET | Freq: Once | ORAL | Status: AC
  Administered 2018-06-27: 1000 mg via ORAL
  Filled 2018-06-27: qty 2

## 2018-06-27 MED ORDER — KETOROLAC TROMETHAMINE 60 MG/2ML IM SOLN
15.0000 mg | Freq: Once | INTRAMUSCULAR | Status: AC
  Administered 2018-06-27: 15 mg via INTRAMUSCULAR
  Filled 2018-06-27: qty 2

## 2018-06-27 MED ORDER — METOCLOPRAMIDE HCL 5 MG/ML IJ SOLN
5.0000 mg | Freq: Once | INTRAMUSCULAR | Status: AC
  Administered 2018-06-27: 5 mg via INTRAMUSCULAR
  Filled 2018-06-27: qty 2

## 2018-06-27 MED ORDER — PROCHLORPERAZINE EDISYLATE 10 MG/2ML IJ SOLN
5.0000 mg | Freq: Once | INTRAMUSCULAR | Status: AC
  Administered 2018-06-27: 5 mg via INTRAMUSCULAR
  Filled 2018-06-27: qty 2

## 2018-06-27 MED ORDER — DIPHENHYDRAMINE HCL 50 MG/ML IJ SOLN
12.5000 mg | Freq: Once | INTRAMUSCULAR | Status: AC
  Administered 2018-06-27: 12.5 mg via INTRAMUSCULAR
  Filled 2018-06-27: qty 1

## 2018-06-27 NOTE — ED Provider Notes (Signed)
[facility] DEPT Provider Note   CSN: [id number] Arrival date & time: 06/27/18  1537     History   Chief Complaint Chief Complaint  Patient presents with  . Fall    HPI Dorothy White is a 67 y.o. female.  67 yo F with a chief complaint of fall.  Patient has a history of frequent falls and she thinks this is similar for her prior falls.  She ended up striking the left side of her head she thinks and then she had her knees.  Complaining of a diffuse headache and double vision.  Symptoms have gotten slightly worse over the past couple days.  She denies unilateral numbness or weakness denies difficulty with speech or swallowing.  She thinks she lost a tooth and she has a laceration to her right upper lip.  Fall happened greater than 48 hours ago.  The she tried her home sumatriptan without improvement. She describes a double vision is one next to each other.  Resolved with closure of one eye or the other.  The history is provided by the patient.  Fall  This is a recurrent problem. The current episode started 2 days ago. The problem occurs constantly. The problem has been resolved. Associated symptoms include headaches. Pertinent negatives include no chest pain, no abdominal pain and no shortness of breath. Nothing aggravates the symptoms. Nothing relieves the symptoms. She has tried nothing for the symptoms. The treatment provided no relief.    Past Medical History:  Diagnosis Date  . Anemia    ?  Marland Kitchen Anxiety   . Arthritis    "everywhere" (04/25/2016)  . 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 Chronic back pain    "all over" (04/25/2016)  . Constipation   . Depression   . Diverticulitis   . GERD (gastroesophageal reflux disease)   . Headache    "weekly" (04/25/2016)  . 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 HLD (hyperlipidemia)    hx (04/25/2016)  . 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  Mild CAD    a. 25% mLAD, otherwise no sig disease 01/2016 cath.  Marland Kitchen NICM (nonischemic cardiomyopathy) ([place])    a. EF 40-45% by echo 12/735 at time of complicated migraine/neuro sx, 55-65% at time of cath 01/2016  . Stroke [facility]) 06/2013   "mini" stroke , ?possibly hemaplegic migraine per pt    Patient Active Problem List   Diagnosis Date Noted  . Recurrent falls 04/16/2017  . 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 At risk for adverse drug event 04/07/2017  . Acute blood loss as cause of postoperative anemia 04/04/2017  . Acute delirium 04/04/2017  . Osteoporosis 04/02/2017  . Closed fracture of neck of left femur ([place]) 03/31/2017  . Scalp contusion 03/31/2017  . Fall at home, initial encounter 03/31/2017  . Hip fracture ([place]) 03/31/2017  . 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 Hx of transient ischemic attack (TIA) 02/19/2017  . Left carotid stenosis- 40-59% 02/19/2017  . Malnutrition of moderate degree 02/18/2017  . Falls 02/17/2017  . Chest pain 09/26/2016  . Diverticulitis of sigmoid colon 04/25/2016  . Infection due to ESBL-producing Escherichia coli/Diverticular Abscess 03/15/2016  . Diverticulitis 03/12/2016  . Chronic pain syndrome 03/12/2016  . Lesion of pancreas 03/12/2016  . History of chest pain 03/12/2016  . Hypokalemia 03/12/2016  . Angina, class IV ([place]) - chest tightness and pressure with dyspnea with minimal exertion  02/17/2016    Class: Question of  . TIA (transient ischemic attack) 12/26/2015  . DOE (dyspnea on exertion) 12/26/2015  . Right sided weakness 12/17/2015  . Ataxia 12/17/2015  . Shoulder fracture 02/15/2015  . Chronic headache 10/11/2013  . Weakness generalized 08/12/2013  . Dizziness and giddiness 08/12/2013  . Abnormality of gait 07/11/2013  . Hyperlipidemia 05/07/2007  . Migraine without aura 05/07/2007  . PREMATURE VENTRICULAR CONTRACTIONS, FREQUENT 05/07/2007  . GERD 05/07/2007  . DIVERTICULOSIS, COLON 05/07/2007  .  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 OSTEOPENIA 05/07/2007  . Anxiety and depression 03/24/2007  . HEMORRHOIDS 03/24/2007  . ALLERGIC RHINITIS 03/24/2007  . LOW BACK PAIN 03/24/2007  . MIGRAINES, HX OF 03/24/2007    Past Surgical History:  Procedure Laterality Date  . ANTERIOR APPROACH HEMI HIP ARTHROPLASTY Left 04/01/2017   Procedure: LEFT DIRECT ANTERIOR TOTAL HIP REPLACEMENT;  Surgeon: Leandrew Koyanagi, MD;  Location: [facility];  Service: Orthopedics;  Laterality: Left;  LEFT DIRECT ANTERIOR TOTAL HIP REPLACEMENT  . AUGMENTATION MAMMAPLASTY  1980  . BASAL CELL CARCINOMA EXCISION     "tip of my nose"  . BUNIONECTOMY Bilateral 10/2003  . CARDIAC CATHETERIZATION N/A 02/20/2016   Procedure: Left Heart Cath and Coronary Angiography;  Surgeon: Jettie Booze, MD;  Location: [facility] CV LAB;  Service: Cardiovascular;  Laterality: N/A;  . COLONOSCOPY    . DILATION AND CURETTAGE OF UTERUS    . FRACTURE SURGERY    . IR GENERIC HISTORICAL  03/18/2016   IR SINUS/FIST TUBE CHK-NON GI 03/18/2016 Aletta Edouard, MD [facility]-INTERV RAD  . LAPAROSCOPIC SIGMOID COLECTOMY N/A 04/25/2016   Procedure: LAPAROSCOPIC SIGMOID COLECTOMY;  Surgeon: Stark Klein, MD;  Location: [facility];  Service: General;  Laterality: N/A;  . OOPHORECTOMY Bilateral ~ 1999  . ORIF HUMERUS FRACTURE Right 02/15/2015   Procedure: OPEN REDUCTION INTERNAL FIXATION (ORIF) PROXIMAL HUMERUS FRACTURE;  Surgeon: Netta Cedars, MD;  Location: [facility];  Service: Orthopedics;  Laterality: Right;  . RHINOPLASTY  1976  . TONSILLECTOMY    . TUBAL LIGATION  ~ 1983  . VAGINAL HYSTERECTOMY  ~ 1998     OB History    Gravida  0   Para  0   Term  0   Preterm  0   AB  0   Living  0     SAB  0   TAB  0   Ectopic  0   Multiple  0   Live Births  0            Home Medications    Prior to Admission medications   Medication Sig Start Date End Date Taking? Authorizing Provider  clonazePAM (KLONOPIN) 1 MG tablet Take 1.5 mg by mouth  at bedtime as needed for anxiety.  06/02/18  Yes [provider]  FLUoxetine (PROZAC) 40 MG capsule Take 80 mg by mouth daily. 04/18/18  Yes [provider]  gabapentin (NEURONTIN) 100 MG capsule Take 100 mg by mouth daily as needed (nerve pain).  01/23/18  Yes [provider]  LUTEIN PO Take 1 capsule by mouth daily.    Yes [provider]  Magnesium 500 MG TABS Take 500 mg by mouth at bedtime.   Yes [provider]  moxifloxacin (VIGAMOX) 0.5 % ophthalmic solution Place 1 drop into the left eye 4 (four) times daily. 04/02/18  Yes [provider]  omeprazole (PRILOSEC) 40 MG capsule Take 40 mg by mouth daily. 06/22/17  Yes [provider]  oxyCODONE-acetaminophen (PERCOCET) 10-325 MG tablet Take 1 tablet by mouth every 4 (four) hours as needed for pain.  06/01/18  Yes [provider]  SUMAtriptan (IMITREX) 100 MG tablet Take 50-100 mg by mouth every 2 (two) hours as needed for migraine or headache.  12/29/17  Yes [provider]  tiZANidine (ZANAFLEX) 4 MG tablet Take 4 mg by mouth 3 (three) times daily as needed for muscle spasms.  12/28/17  Yes [provider]  topiramate (TOPAMAX) 100 MG tablet Take 1 tablet (100 mg total) by mouth at bedtime. 12/28/17  Yes Kathrynn Ducking, MD  trazodone (DESYREL) 300 MG tablet Take 1 tablet (300 mg total) by mouth at bedtime as needed for sleep. Patient taking differently: Take 300 mg by mouth at bedtime. sleep 04/22/17  Yes Medina-Vargas, Monina C, NP  vitamin B-12 (CYANOCOBALAMIN) 1000 MCG tablet Take 1 tablet (1,000 mcg total) by mouth daily. 02/19/17  Yes Debbe Odea, MD  vitamin E 100 UNIT capsule Take 100 Units by mouth daily. 03/15/18  Yes [provider]  zolpidem (AMBIEN) 10 MG tablet Take 10 mg by mouth at bedtime as needed for sleep.  03/25/18  Yes [provider]  meloxicam (MOBIC) 7.5 MG tablet Take 2 tablets (15 mg total) by mouth daily. Patient not  taking: Reported on 02/25/2018 02/13/18   Noemi Chapel, MD  methocarbamol (ROBAXIN) 500 MG tablet Take 1 tablet (500 mg total) by mouth 2 (two) times daily. Patient not taking: Reported on 02/25/2018 01/15/18   Lacretia Leigh, MD  mometasone (NASONEX) 50 MCG/ACT nasal spray Place 2 sprays into the nose daily. Patient not taking: Reported on 04/19/2018 04/22/17   Medina-Vargas, Monina C, NP  ondansetron (ZOFRAN ODT) 4 MG disintegrating tablet Take 1 tablet (4 mg total) by mouth every 8 (eight) hours as needed for nausea or vomiting. Patient not taking: Reported on 04/19/2018 04/17/18   Fredia Sorrow, MD  ondansetron (ZOFRAN) 8 MG tablet Take 1 tablet (8 mg total) by mouth every 8 (eight) hours as needed for nausea or vomiting. Patient not taking: Reported on 04/19/2018 04/22/17   Medina-Vargas, Senaida Lange, NP    Family History Family History  Problem Relation Age of Onset  . Lung cancer Mother 48  . Migraines Mother   . Heart attack Father        Vague  . Hypertension Brother   . Esophageal cancer Brother   . Diabetes Paternal Grandmother        questionable  . Colon cancer Neg Hx   . Kidney disease Neg Hx   . Liver disease Neg Hx     Social History Social History   Tobacco Use  . Smoking status: Current Some Day Smoker    Packs/day: 1.00    Years: 34.00    Pack years: 34.00    Types: Cigarettes    Last attempt to quit: 02/03/2017    Years since quitting: 1.3  . Smokeless tobacco: Never Used  . Tobacco comment: 10 per week or less  Substance Use Topics  . Alcohol use: Yes    Alcohol/week: 1.0 standard drinks    Types: 1 Standard drinks or equivalent per week    Comment: occ   . Drug use: No    Types: Cocaine    Comment: Denies any drug use 02/19/18     Allergies   Opana [oxymorphone hcl] and Penicillins   Review of Systems Review of Systems  Constitutional: Negative for chills and fever.  HENT: Positive for  facial swelling. Negative for congestion and rhinorrhea.   Eyes:  Positive for visual disturbance. Negative for pain, discharge, redness and itching.  Respiratory: Negative for shortness of breath and wheezing.   Cardiovascular: Negative for chest pain and palpitations.  Gastrointestinal: Negative for abdominal pain, nausea and vomiting.  Genitourinary: Negative for dysuria and urgency.  Musculoskeletal: Positive for arthralgias. Negative for myalgias.  Skin: Negative for pallor and wound.  Neurological: Positive for headaches. Negative for dizziness.     Physical Exam Updated Vital Signs BP 113/70   Pulse 70   Temp 98.9 F (37.2 C) (Oral)   Resp 16   SpO2 96%   Physical Exam  Constitutional: She is oriented to person, place, and time. She appears well-developed and well-nourished. No distress.  HENT:  Head: Normocephalic and atraumatic.  No midline C-spine tenderness.  Rotates her head 45 degrees in either direction without pain.  TMs are normal bilaterally. Bruising and swelling to the right periorbital area.  Bruising to the left frontal region.  Various stages of bruising noted about the head.  Through and through laceration of the right upper lip through the vermilion border.  Missing the right lateral incisor fractured at the gumline.  Eyes: Pupils are equal, round, and reactive to light. EOM are normal.  Nystagmus with left eye with far leftward gaze far rightward gaze.  Neck: Normal range of motion. Neck supple.  Cardiovascular: Normal rate and regular rhythm. Exam reveals no gallop and no friction rub.  No murmur heard. Pulmonary/Chest: Effort normal. She has no wheezes. She has no rales.  Abdominal: Soft. She exhibits no distension. There is no tenderness.  Musculoskeletal: She exhibits no edema or tenderness.  Bruises to bilateral knees, small abrasion to the right knee.  Full range of motion of bilateral knees without significant tenderness.  Neurological: She is alert and oriented to person, place, and time.  Skin: Skin is warm  and dry. She is not diaphoretic.  Psychiatric: She has a normal mood and affect. Her behavior is normal.  Nursing note and vitals reviewed.    ED Treatments / Results  Labs (all labs ordered are listed, but only abnormal results are displayed) Labs Reviewed - No data to display  EKG None  Radiology Ct Head Wo Contrast  Result Date: 06/27/2018 CLINICAL DATA:  Status post fall. Patient complains of double vision. Bruising of the face. EXAM: CT HEAD WITHOUT CONTRAST CT MAXILLOFACIAL WITHOUT CONTRAST TECHNIQUE: Multidetector CT imaging of the head and maxillofacial structures were performed using the standard protocol without intravenous contrast. Multiplanar CT image reconstructions of the maxillofacial structures were also generated. COMPARISON:  None. FINDINGS: CT HEAD FINDINGS Brain: No evidence of acute infarction, hemorrhage, hydrocephalus, extra-axial collection or mass lesion/mass effect. Brain parenchymal volume loss. Vascular: Calcific atherosclerotic disease of the intra cavernous carotid arteries. Skull: Normal. Negative for fracture or focal lesion. Other: None. CT MAXILLOFACIAL FINDINGS Osseous: Minimally displaced bilateral nasal fractures. The nasal septum is intact. Poor dentition with several broken, missing teeth and large cavities. Orbits: Negative. No traumatic or inflammatory finding. Sinuses: Mild mucosal thickening of the ethmoid sinuses, otherwise clear. Soft tissues: Right buccal hematoma. IMPRESSION: No acute intracranial abnormality. Brain parenchymal volume loss. Minimally displaced bilateral nasal fractures with right buccal hematoma. Electronically Signed   By: Fidela Salisbury M.D.   On: 06/27/2018 17:07   Ct Maxillofacial Wo Contrast  Result Date: 06/27/2018 CLINICAL DATA:  Status post fall. Patient complains of double vision. Bruising of the face. EXAM: CT HEAD WITHOUT  CONTRAST CT MAXILLOFACIAL WITHOUT CONTRAST TECHNIQUE: Multidetector CT imaging of the head and  maxillofacial structures were performed using the standard protocol without intravenous contrast. Multiplanar CT image reconstructions of the maxillofacial structures were also generated. COMPARISON:  None. FINDINGS: CT HEAD FINDINGS Brain: No evidence of acute infarction, hemorrhage, hydrocephalus, extra-axial collection or mass lesion/mass effect. Brain parenchymal volume loss. Vascular: Calcific atherosclerotic disease of the intra cavernous carotid arteries. Skull: Normal. Negative for fracture or focal lesion. Other: None. CT MAXILLOFACIAL FINDINGS Osseous: Minimally displaced bilateral nasal fractures. The nasal septum is intact. Poor dentition with several broken, missing teeth and large cavities. Orbits: Negative. No traumatic or inflammatory finding. Sinuses: Mild mucosal thickening of the ethmoid sinuses, otherwise clear. Soft tissues: Right buccal hematoma. IMPRESSION: No acute intracranial abnormality. Brain parenchymal volume loss. Minimally displaced bilateral nasal fractures with right buccal hematoma. Electronically Signed   By: Fidela Salisbury M.D.   On: 06/27/2018 17:07    Procedures Procedures (including critical care time)  Medications Ordered in ED Medications  prochlorperazine (COMPAZINE) injection 5 mg (5 mg Intramuscular Given 06/27/18 1658)  diphenhydrAMINE (BENADRYL) injection 12.5 mg (12.5 mg Intramuscular Given 06/27/18 1659)  acetaminophen (TYLENOL) tablet 1,000 mg (1,000 mg Oral Given 06/27/18 1656)  ketorolac (TORADOL) injection 15 mg (15 mg Intramuscular Given 06/27/18 1813)  metoCLOPramide (REGLAN) injection 5 mg (5 mg Intramuscular Given 06/27/18 1813)     Initial Impression / Assessment and Plan / ED Course  I have reviewed the triage vital signs and the nursing notes.  Pertinent labs & imaging results that were available during my care of the patient were reviewed by me and considered in my medical decision making (see chart for details).     67 yo F with a  chief complaints of fall from standing.  This is a chronic problem for her she has had multiple falls and is been seen in the ED multiple times for the same.  Sounds mechanical in nature.  She ended up fracturing 1 of her teeth and had a through and through laceration of the right upper lip.  Unfortunately this happened greater than 48 hours ago.  Her chief complaint at this point is persistent headache and double vision.  Will obtain a CT the head and face.  CT of the head and face without etiology to that would explain her diplopia.  I discussed the case with neurology, Dr. Leonel Ramsay he felt with the eye changes that I noticed on exam he recommended an MRI for further evaluation.  I went back to reassess the patient and she had left.  Nursing said she commented earlier that she wanted narcotics for her headache.  The patients results and plan were reviewed and discussed.   Any x-rays performed were independently reviewed by myself.   Differential diagnosis were considered with the presenting HPI.  Medications  prochlorperazine (COMPAZINE) injection 5 mg (5 mg Intramuscular Given 06/27/18 1658)  diphenhydrAMINE (BENADRYL) injection 12.5 mg (12.5 mg Intramuscular Given 06/27/18 1659)  acetaminophen (TYLENOL) tablet 1,000 mg (1,000 mg Oral Given 06/27/18 1656)  ketorolac (TORADOL) injection 15 mg (15 mg Intramuscular Given 06/27/18 1813)  metoCLOPramide (REGLAN) injection 5 mg (5 mg Intramuscular Given 06/27/18 1813)    Vitals:   06/27/18 1800 06/27/18 1830 06/27/18 1900 06/27/18 1901  BP: (!) 104/53 (!) 108/59 113/70   Pulse: 65 64 70 70  Resp: 16 15 16    Temp:      TempSrc:      SpO2: 94% 96%  96%  Final diagnoses:  Frequent falls  Acute post-traumatic headache, not intractable  Nystagmus      Final Clinical Impressions(s) / ED Diagnoses   Final diagnoses:  Frequent falls  Acute post-traumatic headache, not intractable  Nystagmus    ED Discharge Orders    None         Deno Etienne, DO 06/27/18 2030

## 2018-06-27 NOTE — ED Notes (Signed)
Bed: WA01 Expected date:  Expected time:  Means of arrival:  Comments: Fall triage

## 2018-06-27 NOTE — ED Triage Notes (Signed)
Patient fell down steps on Friday. Patient knocked out a tooth. Patient c/o seeing double. Patient takes baby Asprin. Significant bruising on face and head. C/o of left knee pain.   A/Ox4  Cataract surgery a couple weeks ago.

## 2018-06-27 NOTE — ED Notes (Signed)
Patient left AMA. Did not notify her nurse or on-coming nurse. Patient's room found empty.

## 2018-07-02 ENCOUNTER — Emergency Department (HOSPITAL_COMMUNITY): Payer: Medicare Other

## 2018-07-02 ENCOUNTER — Inpatient Hospital Stay (HOSPITAL_COMMUNITY)
Admission: EM | Admit: 2018-07-02 | Discharge: 2018-07-07 | DRG: 085 | Disposition: A | Discharge status: Skilled Nursing Facility | Payer: Medicare Other | Attending: Family Medicine | Admitting: Family Medicine

## 2018-07-02 ENCOUNTER — Encounter (HOSPITAL_COMMUNITY): Payer: Self-pay | Admitting: Emergency Medicine

## 2018-07-02 DIAGNOSIS — N390 Urinary tract infection, site not specified: Secondary | ICD-10-CM | POA: Diagnosis present

## 2018-07-02 DIAGNOSIS — B9620 Unspecified Escherichia coli [E. coli] as the cause of diseases classified elsewhere: Secondary | ICD-10-CM | POA: Diagnosis present

## 2018-07-02 DIAGNOSIS — E46 Unspecified protein-calorie malnutrition: Secondary | ICD-10-CM

## 2018-07-02 DIAGNOSIS — S065X0A Traumatic subdural hemorrhage without loss of consciousness, initial encounter: Secondary | ICD-10-CM | POA: Diagnosis not present

## 2018-07-02 DIAGNOSIS — I951 Orthostatic hypotension: Secondary | ICD-10-CM | POA: Diagnosis present

## 2018-07-02 DIAGNOSIS — Z833 Family history of diabetes mellitus: Secondary | ICD-10-CM

## 2018-07-02 DIAGNOSIS — E872 Acidosis, unspecified: Secondary | ICD-10-CM

## 2018-07-02 DIAGNOSIS — Z880 Allergy status to penicillin: Secondary | ICD-10-CM

## 2018-07-02 DIAGNOSIS — S01511A Laceration without foreign body of lip, initial encounter: Secondary | ICD-10-CM | POA: Diagnosis present

## 2018-07-02 DIAGNOSIS — M25562 Pain in left knee: Secondary | ICD-10-CM | POA: Diagnosis present

## 2018-07-02 DIAGNOSIS — M25462 Effusion, left knee: Secondary | ICD-10-CM | POA: Diagnosis present

## 2018-07-02 DIAGNOSIS — Z85828 Personal history of other malignant neoplasm of skin: Secondary | ICD-10-CM

## 2018-07-02 DIAGNOSIS — E43 Unspecified severe protein-calorie malnutrition: Secondary | ICD-10-CM | POA: Diagnosis present

## 2018-07-02 DIAGNOSIS — G43909 Migraine, unspecified, not intractable, without status migrainosus: Secondary | ICD-10-CM | POA: Diagnosis present

## 2018-07-02 DIAGNOSIS — I2510 Atherosclerotic heart disease of native coronary artery without angina pectoris: Secondary | ICD-10-CM | POA: Diagnosis present

## 2018-07-02 DIAGNOSIS — F1390 Sedative, hypnotic, or anxiolytic use, unspecified, uncomplicated: Secondary | ICD-10-CM | POA: Diagnosis present

## 2018-07-02 DIAGNOSIS — W19XXXA Unspecified fall, initial encounter: Secondary | ICD-10-CM | POA: Diagnosis present

## 2018-07-02 DIAGNOSIS — E860 Dehydration: Secondary | ICD-10-CM | POA: Diagnosis present

## 2018-07-02 DIAGNOSIS — Z90710 Acquired absence of both cervix and uterus: Secondary | ICD-10-CM

## 2018-07-02 DIAGNOSIS — I428 Other cardiomyopathies: Secondary | ICD-10-CM | POA: Diagnosis present

## 2018-07-02 DIAGNOSIS — G8929 Other chronic pain: Secondary | ICD-10-CM | POA: Diagnosis present

## 2018-07-02 DIAGNOSIS — S065X9A Traumatic subdural hemorrhage with loss of consciousness of unspecified duration, initial encounter: Secondary | ICD-10-CM | POA: Diagnosis present

## 2018-07-02 DIAGNOSIS — Z79891 Long term (current) use of opiate analgesic: Secondary | ICD-10-CM

## 2018-07-02 DIAGNOSIS — K219 Gastro-esophageal reflux disease without esophagitis: Secondary | ICD-10-CM | POA: Diagnosis present

## 2018-07-02 DIAGNOSIS — S065XAA Traumatic subdural hemorrhage with loss of consciousness status unknown, initial encounter: Secondary | ICD-10-CM | POA: Diagnosis present

## 2018-07-02 DIAGNOSIS — Z801 Family history of malignant neoplasm of trachea, bronchus and lung: Secondary | ICD-10-CM

## 2018-07-02 DIAGNOSIS — M1990 Unspecified osteoarthritis, unspecified site: Secondary | ICD-10-CM | POA: Diagnosis present

## 2018-07-02 DIAGNOSIS — E639 Nutritional deficiency, unspecified: Secondary | ICD-10-CM

## 2018-07-02 DIAGNOSIS — Z8673 Personal history of transient ischemic attack (TIA), and cerebral infarction without residual deficits: Secondary | ICD-10-CM

## 2018-07-02 DIAGNOSIS — R296 Repeated falls: Secondary | ICD-10-CM

## 2018-07-02 DIAGNOSIS — Z800 Family history of malignant neoplasm of digestive organs: Secondary | ICD-10-CM

## 2018-07-02 DIAGNOSIS — S022XXA Fracture of nasal bones, initial encounter for closed fracture: Secondary | ICD-10-CM

## 2018-07-02 DIAGNOSIS — F419 Anxiety disorder, unspecified: Secondary | ICD-10-CM | POA: Diagnosis present

## 2018-07-02 DIAGNOSIS — Z79899 Other long term (current) drug therapy: Secondary | ICD-10-CM

## 2018-07-02 DIAGNOSIS — F0390 Unspecified dementia without behavioral disturbance: Secondary | ICD-10-CM | POA: Diagnosis present

## 2018-07-02 DIAGNOSIS — Z96642 Presence of left artificial hip joint: Secondary | ICD-10-CM | POA: Diagnosis present

## 2018-07-02 DIAGNOSIS — W010XXA Fall on same level from slipping, tripping and stumbling without subsequent striking against object, initial encounter: Secondary | ICD-10-CM | POA: Diagnosis present

## 2018-07-02 DIAGNOSIS — H532 Diplopia: Secondary | ICD-10-CM | POA: Diagnosis present

## 2018-07-02 DIAGNOSIS — Z885 Allergy status to narcotic agent status: Secondary | ICD-10-CM

## 2018-07-02 DIAGNOSIS — E785 Hyperlipidemia, unspecified: Secondary | ICD-10-CM | POA: Diagnosis present

## 2018-07-02 DIAGNOSIS — G9340 Encephalopathy, unspecified: Secondary | ICD-10-CM | POA: Diagnosis present

## 2018-07-02 DIAGNOSIS — K5792 Diverticulitis of intestine, part unspecified, without perforation or abscess without bleeding: Secondary | ICD-10-CM | POA: Diagnosis present

## 2018-07-02 DIAGNOSIS — F329 Major depressive disorder, single episode, unspecified: Secondary | ICD-10-CM | POA: Diagnosis present

## 2018-07-02 DIAGNOSIS — M25569 Pain in unspecified knee: Secondary | ICD-10-CM

## 2018-07-02 DIAGNOSIS — F32A Depression, unspecified: Secondary | ICD-10-CM

## 2018-07-02 DIAGNOSIS — Z681 Body mass index (BMI) 19 or less, adult: Secondary | ICD-10-CM

## 2018-07-02 DIAGNOSIS — Z8249 Family history of ischemic heart disease and other diseases of the circulatory system: Secondary | ICD-10-CM

## 2018-07-02 DIAGNOSIS — M549 Dorsalgia, unspecified: Secondary | ICD-10-CM | POA: Diagnosis present

## 2018-07-02 LAB — CBC
HCT: 41.6 % (ref 36.0–46.0)
HEMOGLOBIN: 12.6 g/dL (ref 12.0–15.0)
MCH: 26.5 pg (ref 26.0–34.0)
MCHC: 30.3 g/dL (ref 30.0–36.0)
MCV: 87.6 fL (ref 80.0–100.0)
Platelets: 280 10*3/uL (ref 150–400)
RBC: 4.75 MIL/uL (ref 3.87–5.11)
RDW: 13.2 % (ref 11.5–15.5)
WBC: 5.9 10*3/uL (ref 4.0–10.5)
nRBC: 0 % (ref 0.0–0.2)

## 2018-07-02 LAB — BASIC METABOLIC PANEL
Anion gap: 13 (ref 5–15)
BUN: 19 mg/dL (ref 8–23)
CO2: 19 mmol/L — AB (ref 22–32)
Calcium: 9.2 mg/dL (ref 8.9–10.3)
Chloride: 104 mmol/L (ref 98–111)
Creatinine, Ser: 1 mg/dL (ref 0.44–1.00)
GFR calc Af Amer: >60 mL/min (ref >60)
GFR calc non Af Amer: 58 mL/min — ABNORMAL LOW (ref >60)
Glucose, Bld: 101 mg/dL — ABNORMAL HIGH (ref 70–99)
Potassium: 3.7 mmol/L (ref 3.5–5.1)
Sodium: 136 mmol/L (ref 135–145)

## 2018-07-02 LAB — URINALYSIS, ROUTINE W REFLEX MICROSCOPIC
BILIRUBIN URINE: NEGATIVE
Glucose, UA: NEGATIVE mg/dL
Hgb urine dipstick: NEGATIVE
KETONES UR: 5 mg/dL — AB
Nitrite: POSITIVE — AB
Protein, ur: 100 mg/dL — AB
Specific Gravity, Urine: 1.02 (ref 1.005–1.030)
WBC, UA: >50 WBC/hpf — ABNORMAL HIGH (ref 0–5)
pH: 5 (ref 5.0–8.0)

## 2018-07-02 LAB — HEPATIC FUNCTION PANEL
ALT: 9 U/L (ref 0–44)
AST: 12 U/L — ABNORMAL LOW (ref 15–41)
Albumin: 3.2 g/dL — ABNORMAL LOW (ref 3.5–5.0)
Alkaline Phosphatase: 84 U/L (ref 38–126)
BILIRUBIN INDIRECT: 0.5 mg/dL (ref 0.3–0.9)
Bilirubin, Direct: 0.2 mg/dL (ref 0.0–0.2)
Total Bilirubin: 0.7 mg/dL (ref 0.3–1.2)
Total Protein: 5.8 g/dL — ABNORMAL LOW (ref 6.5–8.1)

## 2018-07-02 LAB — ETHANOL: Alcohol, Ethyl (B): <10 mg/dL (ref <10)

## 2018-07-02 LAB — TROPONIN I: Troponin I: <0.03 ng/mL (ref <0.03)

## 2018-07-02 LAB — RAPID URINE DRUG SCREEN, HOSP PERFORMED
Amphetamines: NOT DETECTED
Barbiturates: NOT DETECTED
Benzodiazepines: POSITIVE — AB
Cocaine: NOT DETECTED
Opiates: NOT DETECTED
TETRAHYDROCANNABINOL: NOT DETECTED

## 2018-07-02 LAB — CBG MONITORING, ED: Glucose-Capillary: 98 mg/dL (ref 70–99)

## 2018-07-02 MED ORDER — SODIUM CHLORIDE 0.9 % IV BOLUS
1000.0000 mL | Freq: Once | INTRAVENOUS | Status: AC
  Administered 2018-07-02: 1000 mL via INTRAVENOUS

## 2018-07-02 MED ORDER — VITAMIN E 45 MG (100 UNIT) PO CAPS
100.0000 [IU] | ORAL_CAPSULE | Freq: Every day | ORAL | Status: DC
  Administered 2018-07-03 – 2018-07-07 (×5): 100 [IU] via ORAL
  Filled 2018-07-02 (×5): qty 1

## 2018-07-02 MED ORDER — LACTATED RINGERS IV SOLN
INTRAVENOUS | Status: AC
  Administered 2018-07-02: 23:00:00 via INTRAVENOUS

## 2018-07-02 MED ORDER — KETOROLAC TROMETHAMINE 30 MG/ML IJ SOLN
30.0000 mg | Freq: Four times a day (QID) | INTRAMUSCULAR | Status: AC | PRN
  Administered 2018-07-03: 30 mg via INTRAVENOUS
  Filled 2018-07-02 (×2): qty 1

## 2018-07-02 MED ORDER — PANTOPRAZOLE SODIUM 40 MG PO TBEC
40.0000 mg | DELAYED_RELEASE_TABLET | Freq: Every day | ORAL | Status: DC
  Administered 2018-07-03 – 2018-07-07 (×5): 40 mg via ORAL
  Filled 2018-07-02 (×6): qty 1

## 2018-07-02 MED ORDER — FLUOXETINE HCL 20 MG PO CAPS
80.0000 mg | ORAL_CAPSULE | Freq: Every day | ORAL | Status: DC
  Administered 2018-07-03 – 2018-07-07 (×5): 80 mg via ORAL
  Filled 2018-07-02 (×5): qty 4

## 2018-07-02 MED ORDER — VITAMIN B-12 1000 MCG PO TABS
1000.0000 ug | ORAL_TABLET | Freq: Every day | ORAL | Status: DC
  Administered 2018-07-03 – 2018-07-07 (×5): 1000 ug via ORAL
  Filled 2018-07-02 (×5): qty 1

## 2018-07-02 MED ORDER — OXYCODONE-ACETAMINOPHEN 5-325 MG PO TABS
2.0000 | ORAL_TABLET | Freq: Once | ORAL | Status: AC
  Administered 2018-07-02: 2 via ORAL
  Filled 2018-07-02: qty 2

## 2018-07-02 MED ORDER — KETOROLAC TROMETHAMINE 30 MG/ML IJ SOLN
30.0000 mg | Freq: Once | INTRAMUSCULAR | Status: AC
  Administered 2018-07-02: 30 mg via INTRAVENOUS
  Filled 2018-07-02: qty 1

## 2018-07-02 MED ORDER — MAGNESIUM OXIDE 400 (241.3 MG) MG PO TABS
400.0000 mg | ORAL_TABLET | Freq: Every day | ORAL | Status: DC
  Administered 2018-07-02 – 2018-07-06 (×5): 400 mg via ORAL
  Filled 2018-07-02 (×4): qty 1

## 2018-07-02 MED ORDER — TOPIRAMATE 100 MG PO TABS
100.0000 mg | ORAL_TABLET | Freq: Every day | ORAL | Status: DC
  Administered 2018-07-02 – 2018-07-06 (×5): 100 mg via ORAL
  Filled 2018-07-02 (×5): qty 1

## 2018-07-02 MED ORDER — TRAZODONE HCL 100 MG PO TABS
300.0000 mg | ORAL_TABLET | Freq: Once | ORAL | Status: AC
  Administered 2018-07-03: 300 mg via ORAL
  Filled 2018-07-02: qty 3

## 2018-07-02 NOTE — ED Triage Notes (Signed)
Pt present s/p fall on 06/25/18, pt was seen at [facility], dx with conscussion. Pt states she is falling down average 3 x a day since original fall. Pt reports no new injuries, pt does have bruising and abrasions to face/head/arms and legs.  Pt c/o HA, L jaw pain and L knee. Pt reports fall occur when she stands from sitting or lying position.

## 2018-07-02 NOTE — ED Provider Notes (Signed)
[facility] EMERGENCY DEPARTMENT Provider Note   CSN: [id number] Arrival date & time: 07/02/18  1518     History   Chief Complaint Chief Complaint  Patient presents with  . Fall    HPI Dorothy White is a 67 y.o. female.  The history is provided by the patient. No language interpreter was used.  Fall  This is a new problem. The current episode started more than 1 week ago. The problem occurs constantly. The problem has not changed since onset.Associated symptoms include headaches. Pertinent negatives include no chest pain, no abdominal pain and no shortness of breath. Nothing aggravates the symptoms. Nothing relieves the symptoms. She has tried nothing for the symptoms.    Past Medical History:  Diagnosis Date  . Anemia    ?  Marland Kitchen Anxiety   . Arthritis    "everywhere" (04/25/2016)  . 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 Chronic back pain    "all over" (04/25/2016)  . Constipation   . Depression   . Diverticulitis   . GERD (gastroesophageal reflux disease)   . Headache    "weekly" (04/25/2016)  . 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 HLD (hyperlipidemia)    hx (04/25/2016)  . 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 Mild CAD    a. 25% mLAD, otherwise no sig disease 01/2016 cath.  Marland Kitchen NICM (nonischemic cardiomyopathy) ([place])    a. EF 40-45% by echo 09/1538 at time of complicated migraine/neuro sx, 55-65% at time of cath 01/2016  . Stroke [facility]) 06/2013   "mini" stroke , ?possibly hemaplegic migraine per pt    Patient Active Problem List   Diagnosis Date Noted  . Subdural hematoma ([place]) 07/02/2018  . Recurrent falls 04/16/2017  . 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 At risk for adverse drug event 04/07/2017  . Acute blood loss as cause of postoperative anemia 04/04/2017  . Acute delirium 04/04/2017  . Osteoporosis 04/02/2017  . Closed fracture of neck of left femur ([place])  03/31/2017  . Scalp contusion 03/31/2017  . Fall at home, initial encounter 03/31/2017  . Hip fracture ([place]) 03/31/2017  . 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 Hx of transient ischemic attack (TIA) 02/19/2017  . Left carotid stenosis- 40-59% 02/19/2017  . Malnutrition of moderate degree 02/18/2017  . Falls 02/17/2017  . Chest pain 09/26/2016  . Diverticulitis of sigmoid colon 04/25/2016  . Infection due to ESBL-producing Escherichia coli/Diverticular Abscess 03/15/2016  . Diverticulitis 03/12/2016  . Chronic pain syndrome 03/12/2016  . Lesion of pancreas 03/12/2016  . History of chest pain 03/12/2016  . Hypokalemia 03/12/2016  . Angina, class IV ([place]) - chest tightness and pressure with dyspnea with minimal exertion 02/17/2016    Class: Question of  . TIA (transient ischemic attack) 12/26/2015  . DOE (dyspnea on exertion) 12/26/2015  . Right sided weakness 12/17/2015  . Ataxia 12/17/2015  . Shoulder fracture 02/15/2015  . Chronic headache 10/11/2013  . Weakness generalized 08/12/2013  . Dizziness and giddiness 08/12/2013  . Abnormality of gait 07/11/2013  . Hyperlipidemia 05/07/2007  . Migraine without aura 05/07/2007  . PREMATURE VENTRICULAR CONTRACTIONS, FREQUENT 05/07/2007  . GERD 05/07/2007  . DIVERTICULOSIS, COLON 05/07/2007  . 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 OSTEOPENIA 05/07/2007  . Anxiety and depression 03/24/2007  . HEMORRHOIDS 03/24/2007  . ALLERGIC RHINITIS 03/24/2007  . LOW BACK PAIN 03/24/2007  . MIGRAINES, HX OF 03/24/2007    Past  Surgical History:  Procedure Laterality Date  . ANTERIOR APPROACH HEMI HIP ARTHROPLASTY Left 04/01/2017   Procedure: LEFT DIRECT ANTERIOR TOTAL HIP REPLACEMENT;  Surgeon: Leandrew Koyanagi, MD;  Location: [facility];  Service: Orthopedics;  Laterality: Left;  LEFT DIRECT ANTERIOR TOTAL HIP REPLACEMENT  . AUGMENTATION MAMMAPLASTY  1980  . BASAL CELL CARCINOMA EXCISION     "tip of my nose"  . BUNIONECTOMY Bilateral 10/2003  .  CARDIAC CATHETERIZATION N/A 02/20/2016   Procedure: Left Heart Cath and Coronary Angiography;  Surgeon: Jettie Booze, MD;  Location: [place] CV LAB;  Service: Cardiovascular;  Laterality: N/A;  . COLONOSCOPY    . DILATION AND CURETTAGE OF UTERUS    . FRACTURE SURGERY    . IR GENERIC HISTORICAL  03/18/2016   IR SINUS/FIST TUBE CHK-NON GI 03/18/2016 Aletta Edouard, MD [facility]-INTERV RAD  . LAPAROSCOPIC SIGMOID COLECTOMY N/A 04/25/2016   Procedure: LAPAROSCOPIC SIGMOID COLECTOMY;  Surgeon: Stark Klein, MD;  Location: [facility];  Service: General;  Laterality: N/A;  . OOPHORECTOMY Bilateral ~ 1999  . ORIF HUMERUS FRACTURE Right 02/15/2015   Procedure: OPEN REDUCTION INTERNAL FIXATION (ORIF) PROXIMAL HUMERUS FRACTURE;  Surgeon: Netta Cedars, MD;  Location: [place];  Service: Orthopedics;  Laterality: Right;  . RHINOPLASTY  1976  . TONSILLECTOMY    . TUBAL LIGATION  ~ 1983  . VAGINAL HYSTERECTOMY  ~ 1998     OB History    Gravida  0   Para  0   Term  0   Preterm  0   AB  0   Living  0     SAB  0   TAB  0   Ectopic  0   Multiple  0   Live Births  0            Home Medications    Prior to Admission medications   Medication Sig Start Date End Date Taking? Authorizing Provider  FLUoxetine (PROZAC) 40 MG capsule Take 80 mg by mouth daily. 04/18/18  Yes [provider]  LUTEIN PO Take 1 capsule by mouth daily.    Yes [provider]  Magnesium 500 MG TABS Take 500 mg by mouth at bedtime.   Yes [provider]  mometasone (NASONEX) 50 MCG/ACT nasal spray Place 2 sprays into the nose daily. 04/22/17  Yes Medina-Vargas, Monina C, NP  omeprazole (PRILOSEC) 40 MG capsule Take 40 mg by mouth daily. 06/22/17  Yes [provider]  ondansetron (ZOFRAN) 8 MG tablet Take 1 tablet (8 mg total) by mouth every 8 (eight) hours as needed for nausea or vomiting. 04/22/17  Yes Medina-Vargas, Monina C, NP  oxyCODONE-acetaminophen (PERCOCET) 10-325 MG tablet  Take 1 tablet by mouth every 4 (four) hours as needed for pain.  06/01/18  Yes [provider]  SUMAtriptan (IMITREX) 100 MG tablet Take 50-100 mg by mouth every 2 (two) hours as needed for migraine or headache.  12/29/17  Yes [provider]  tiZANidine (ZANAFLEX) 4 MG tablet Take 4 mg by mouth 3 (three) times daily as needed for muscle spasms.  12/28/17  Yes [provider]  topiramate (TOPAMAX) 100 MG tablet Take 1 tablet (100 mg total) by mouth at bedtime. 12/28/17  Yes Kathrynn Ducking, MD  trazodone (DESYREL) 300 MG tablet Take 1 tablet (300 mg total) by mouth at bedtime as needed for sleep. Patient taking differently: Take 300 mg by mouth at bedtime. sleep 04/22/17  Yes Medina-Vargas, Monina C, NP  vitamin  B-12 (CYANOCOBALAMIN) 1000 MCG tablet Take 1 tablet (1,000 mcg total) by mouth daily. 02/19/17  Yes Debbe Odea, MD  vitamin E 100 UNIT capsule Take 100 Units by mouth daily. 03/15/18  Yes [provider]    Family History Family History  Problem Relation Age of Onset  . Lung cancer Mother 8  . Migraines Mother   . Heart attack Father        Vague  . Hypertension Brother   . Esophageal cancer Brother   . Diabetes Paternal Grandmother        questionable  . Colon cancer Neg Hx   . Kidney disease Neg Hx   . Liver disease Neg Hx     Social History Social History   Tobacco Use  . Smoking status: Current Some Day Smoker    Packs/day: 1.00    Years: 34.00    Pack years: 34.00    Types: Cigarettes    Last attempt to quit: 02/03/2017    Years since quitting: 1.4  . Smokeless tobacco: Never Used  . Tobacco comment: 10 per week or less  Substance Use Topics  . Alcohol use: Yes    Alcohol/week: 1.0 standard drinks    Types: 1 Standard drinks or equivalent per week    Comment: occ   . Drug use: No    Types: Cocaine    Comment: Denies any drug use 02/19/18     Allergies   Opana [oxymorphone hcl] and Penicillins   Review of Systems Review  of Systems  Constitutional: Positive for activity change. Negative for chills and fever.  HENT: Negative for ear pain and sore throat.   Eyes: Negative for photophobia, pain and visual disturbance.  Respiratory: Negative for cough and shortness of breath.   Cardiovascular: Negative for chest pain and palpitations.  Gastrointestinal: Negative for abdominal pain and vomiting.  Genitourinary: Negative for dysuria and hematuria.  Musculoskeletal: Negative for arthralgias and back pain.  Skin: Negative for color change and rash.  Neurological: Positive for headaches. Negative for dizziness, tremors, seizures, syncope, facial asymmetry, speech difficulty, weakness, light-headedness and numbness.  All other systems reviewed and are negative.    Physical Exam Updated Vital Signs BP 132/71 (BP Location: Right Arm)   Pulse 65   Temp 97.8 F (36.6 C) (Oral)   Resp 16   Ht 5\' 5"  (1.651 m)   Wt 49 kg   SpO2 100%   BMI 17.97 kg/m   Physical Exam  Constitutional: She appears well-developed and well-nourished. No distress.  HENT:  Head: Head is with raccoon's eyes and with contusion.  Bruising in varying ages on face  Eyes: Conjunctivae are normal.  Neck: Neck supple.  Cardiovascular: Normal rate and regular rhythm.  No murmur heard. Pulmonary/Chest: Effort normal and breath sounds normal. No respiratory distress.  Abdominal: Soft. There is no tenderness.  Musculoskeletal: She exhibits no edema.  Neurological: She is alert. She has normal strength. No cranial nerve deficit or sensory deficit. GCS eye subscore is 4. GCS verbal subscore is 5. GCS motor subscore is 6.  No dysmetria on exam. Normal finger-to-nose and heel-to-shin  Skin: Skin is warm and dry.  Psychiatric: She has a normal mood and affect.  Nursing note and vitals reviewed.  ED Treatments / Results  Labs (all labs ordered are listed, but only abnormal results are displayed) Labs Reviewed  BASIC METABOLIC PANEL -  Abnormal; Notable for the following components:      Result Value   CO2 19 (*)  Glucose, Bld 101 (*)    GFR calc non Af Amer 58 (*)    All other components within normal limits  URINALYSIS, ROUTINE W REFLEX MICROSCOPIC - Abnormal; Notable for the following components:   Color, Urine AMBER (*)    APPearance HAZY (*)    Ketones, ur 5 (*)    Protein, ur 100 (*)    Nitrite POSITIVE (*)    Leukocytes, UA MODERATE (*)    WBC, UA >50 (*)    Bacteria, UA MANY (*)    All other components within normal limits  RAPID URINE DRUG SCREEN, HOSP PERFORMED - Abnormal; Notable for the following components:   Benzodiazepines POSITIVE (*)    All other components within normal limits  HEPATIC FUNCTION PANEL - Abnormal; Notable for the following components:   Total Protein 5.8 (*)    Albumin 3.2 (*)    AST 12 (*)    All other components within normal limits  CBC  ETHANOL  TROPONIN I  HIV ANTIBODY (ROUTINE TESTING W REFLEX)  BASIC METABOLIC PANEL  TROPONIN I  TROPONIN I  CBG MONITORING, ED    EKG None  Radiology Ct Head Wo Contrast  Result Date: 07/02/2018 CLINICAL DATA:  Multiple falls up to 3 times a day since the original fall 06/25/2018. Ataxia. EXAM: CT HEAD WITHOUT CONTRAST TECHNIQUE: Contiguous axial images were obtained from the base of the skull through the vertex without intravenous contrast. COMPARISON:  06/27/2018 FINDINGS: Brain: Late acute to subacute left-sided subdural hematoma measuring up to 5 mm in thickness is noted. This is a new finding since prior. No hydrocephalus. Chronic small vessel ischemic disease of periventricular white matter. No midline shift. No effacement basal cisterns. No large vascular territory infarct. Intra-axial mass. Vascular: Atherosclerosis at the skull base. Hyperdense vessel sign. Skull: Bilateral nasal bone fractures are redemonstrated. No acute skull fracture. Sinuses/Orbits: Intact orbits and globes. Bilateral lens replacements. Other: Clear  bilateral mastoids without mastoid effusion. IMPRESSION: 1. New since prior exam is a late acute to subacute left sided subdural hematoma measuring up to 5 mm in thickness. No midline shift. These results were called by telephone at the time of interpretation on 07/02/2018 at 6:59 pm to Dr. Leonia Reader , who verbally acknowledged these results. 2. Chronic small vessel ischemic disease of periventricular white matter and cerebral atrophy is noted. 3. Bilateral subacute to chronic nasal bone fractures. Electronically Signed   By: Ashley Royalty M.D.   On: 07/02/2018 19:00   Dg Knee Complete 4 Views Left  Result Date: 07/02/2018 CLINICAL DATA:  [place] on 06/25/2018. Persistent knee pain and swelling. EXAM: LEFT KNEE - COMPLETE 4+ VIEW COMPARISON:  04/19/2018 FINDINGS: There is a small knee joint effusion. No evidence of fracture or dislocation. No significant degenerative changes. IMPRESSION: Small knee joint effusion.  Otherwise negative. Electronically Signed   By: Nelson Chimes M.D.   On: 07/02/2018 18:48    Procedures Procedures (including critical care time)  Medications Ordered in ED Medications  ketorolac (TORADOL) 30 MG/ML injection 30 mg (has no administration in time range)  FLUoxetine (PROZAC) capsule 80 mg (has no administration in time range)  pantoprazole (PROTONIX) EC tablet 40 mg (has no administration in time range)  vitamin B-12 (CYANOCOBALAMIN) tablet 1,000 mcg (has no administration in time range)  topiramate (TOPAMAX) tablet 100 mg (has no administration in time range)  Magnesium TABS 500 mg (has no administration in time range)  vitamin E capsule 100 Units (has no administration in time range)  lactated ringers infusion (has no administration in time range)  sodium chloride 0.9 % bolus 1,000 mL (0 mLs Intravenous Stopped 07/02/18 2009)  ketorolac (TORADOL) 30 MG/ML injection 30 mg (30 mg Intravenous Given 07/02/18 1909)  oxyCODONE-acetaminophen (PERCOCET/ROXICET) 5-325 MG per tablet  2 tablet (2 tablets Oral Given 07/02/18 2032)     Initial Impression / Assessment and Plan / ED Course  I have reviewed the triage vital signs and the nursing notes.  Pertinent labs & imaging results that were available during my care of the patient were reviewed by me and considered in my medical decision making (see chart for details).    Patient is a 67 year old female with recurrent falls who has been poorly compliant with physical therapy and neurology follow-up who presents for headache and recurrent falls.  Patient states that she fell yesterday hit her head on the right side and then today fell again but did not hit her head.  Patient is alert and oriented, no focal neurologic deficits on exam. Head CT shows 5 mm late acute/subacute subdural hematoma with no mass-effect.  Neurosurgery was consulted and discussed case by phone.  Dropped a note and recommended repeat CT scan in 6 hours and would see the patient in the morning. Patient will be admitted to hospitalist for further evaluation and management.  Patient was given 1 L of LR fluids, IV Toradol prior to CT scan results for headache & then PO percocet at home dose for pain.   Final Clinical Impressions(s) / ED Diagnoses   Final diagnoses:  Fall, initial encounter  Subdural hematoma [facility])    ED Discharge Orders    None       Erskine Squibb, MD 07/02/18 2251    Jola Schmidt, MD 07/03/18 0001

## 2018-07-02 NOTE — Progress Notes (Signed)
NEUROSURGERY PROGRESS NOTE  Called in in regards to patients head CT. Patient has had multiple falls over the last several days. CT shows a 14mm left sided SDH with no mass effect or midline shift. Pt is not on any blood thinners. Will have medicine admit to work her up for recent falls. Repeat head CT in 6 hours. If stable ok to d/c home from nsgy standpoint.    Temp:  [98.6 F (37 C)] 98.6 F (37 C) (12/06 1539) Pulse Rate:  [68] 68 (12/06 1539) Resp:  [14] 14 (12/06 1539) BP: (118)/(75) 118/75 (12/06 1539) SpO2:  [100 %] 100 % (12/06 1539) Weight:  [49 kg] 49 kg (12/06 1553)   Dorothy Chiquito, NP 07/02/2018 7:51 PM

## 2018-07-02 NOTE — H&P (Addendum)
History and Physical    Dorothy White YQM:[id number] DOB: 04/15/1951 DOA: 07/02/2018  PCP: Maurice Small, MD Patient coming from: Home  Chief Complaint: Falls  HPI: Dorothy White is a 67 y.o. female with medical history significant of anxiety, depression, GERD, hyperlipidemia, nonischemic cardiomyopathy, CVA presenting to the hospital for evaluation of falls.  Patient states she fell a few days ago and got a concussion.  She was in the bathroom and slipped, fell and hit her head on the toilet.  States since then she has had 3-4 additional episodes of falls at home.  She believes she might have hit her head again on the floor during 1 of these falls.  Denies any dizziness, chest pain, or shortness of breath prior to any of the falls.  Denies any loss of consciousness.  She believes her falls are secondary to her legs giving out.  Reports having headaches since her recent falls and pain in her left knee.  She lives alone majority of the time as her roommate is not around much.  States she has not been eating much as she has lost teeth from falls and has not been able to see a dentist.  Denies having any abdominal pain, nausea, vomiting, melena, or hematochezia.  Reports having diarrhea for a week, resolved a few days ago.  She takes a baby aspirin every other day.  She takes 4 to 5 Percocet tablets daily for chronic back pain.  ED Course: Vitals stable on arrival.  Orthostatics positive.  Blood glucose 101.  No leukocytosis.  UA, blood ethanol level, hepatic function panel, and UDS pending.  CT head showing late acute to subacute left-sided subdural hematoma measuring up to 5 mm in thickness.  No midline shift.  Bilateral subacute to chronic nasal bone fractures (reported on his previous CT done 12/1 as well).  X-ray of left knee showing small joint effusion; otherwise negative.  ED provider discussed the case with neurosurgery NP Glenford Peers who recommended repeating the head CT in 6 hours.   Neurosurgery will see the patient in the morning.  Review of Systems: As per HPI otherwise 10 point review of systems negative.  Past Medical History:  Diagnosis Date  . Anemia    ?  Marland Kitchen Anxiety   . Arthritis    "everywhere" (04/25/2016)  . 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 Chronic back pain    "all over" (04/25/2016)  . Constipation   . Depression   . Diverticulitis   . GERD (gastroesophageal reflux disease)   . Headache    "weekly" (04/25/2016)  . 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 HLD (hyperlipidemia)    hx (04/25/2016)  . 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 Mild CAD    a. 25% mLAD, otherwise no sig disease 01/2016 cath.  Marland Kitchen NICM (nonischemic cardiomyopathy) ([place])    a. EF 40-45% by echo 11/2839 at time of complicated migraine/neuro sx, 55-65% at time of cath 01/2016  . Stroke [facility]) 06/2013   "mini" stroke , ?possibly hemaplegic migraine per pt    Past Surgical History:  Procedure Laterality Date  . ANTERIOR APPROACH HEMI HIP ARTHROPLASTY Left 04/01/2017   Procedure: LEFT DIRECT ANTERIOR TOTAL HIP REPLACEMENT;  Surgeon: Leandrew Koyanagi, MD;  Location: [place];  Service: Orthopedics;  Laterality: Left;  LEFT DIRECT ANTERIOR TOTAL HIP REPLACEMENT  . AUGMENTATION MAMMAPLASTY  1980  . BASAL CELL CARCINOMA EXCISION     "  tip of my nose"  . BUNIONECTOMY Bilateral 10/2003  . CARDIAC CATHETERIZATION N/A 02/20/2016   Procedure: Left Heart Cath and Coronary Angiography;  Surgeon: Jettie Booze, MD;  Location: [place] [facility];  Service: Cardiovascular;  Laterality: N/A;  . COLONOSCOPY    . DILATION AND CURETTAGE OF UTERUS    . FRACTURE SURGERY    . IR GENERIC HISTORICAL  03/18/2016   IR SINUS/FIST TUBE CHK-NON GI 03/18/2016 Aletta Edouard, MD [facility]-INTERV RAD  . LAPAROSCOPIC SIGMOID COLECTOMY N/A 04/25/2016   Procedure: LAPAROSCOPIC SIGMOID COLECTOMY;  Surgeon: Stark Klein, MD;  Location: [place];  Service: General;   Laterality: N/A;  . OOPHORECTOMY Bilateral ~ 1999  . ORIF HUMERUS FRACTURE Right 02/15/2015   Procedure: OPEN REDUCTION INTERNAL FIXATION (ORIF) PROXIMAL HUMERUS FRACTURE;  Surgeon: Netta Cedars, MD;  Location: [place];  Service: Orthopedics;  Laterality: Right;  . RHINOPLASTY  1976  . TONSILLECTOMY    . TUBAL LIGATION  ~ 1983  . VAGINAL HYSTERECTOMY  ~ 1998     reports that she has been smoking cigarettes. She has a 34.00 pack-year smoking history. She has never used smokeless tobacco. She reports that she drinks about 1.0 standard drinks of alcohol per week. She reports that she does not use drugs.  Allergies  Allergen Reactions  . Opana [Oxymorphone Hcl] Other (See Comments)    hallucinations  . Penicillins Hives    Has patient had a PCN reaction causing immediate rash, facial/tongue/throat swelling, SOB or lightheadedness with hypotension: Unknown Has patient had a PCN reaction causing severe rash involving mucus membranes or skin necrosis: No Has patient had a PCN reaction that required hospitalization No Has patient had a PCN reaction occurring within the last 10 years: No If all of the above answers are "NO", then may proceed with Cephalosporin use.     Family History  Problem Relation Age of Onset  . Lung cancer Mother 55  . Migraines Mother   . Heart attack Father        Vague  . Hypertension Brother   . Esophageal cancer Brother   . Diabetes Paternal Grandmother        questionable  . Colon cancer Neg Hx   . Kidney disease Neg Hx   . Liver disease Neg Hx     Prior to Admission medications   Medication Sig Start Date End Date Taking? Authorizing Provider  FLUoxetine (PROZAC) 40 MG capsule Take 80 mg by mouth daily. 04/18/18  Yes [provider]  LUTEIN PO Take 1 capsule by mouth daily.    Yes [provider]  Magnesium 500 MG TABS Take 500 mg by mouth at bedtime.   Yes [provider]  mometasone (NASONEX) 50 MCG/ACT nasal spray Place 2  sprays into the nose daily. 04/22/17  Yes Medina-Vargas, Monina C, NP  omeprazole (PRILOSEC) 40 MG capsule Take 40 mg by mouth daily. 06/22/17  Yes [provider]  ondansetron (ZOFRAN) 8 MG tablet Take 1 tablet (8 mg total) by mouth every 8 (eight) hours as needed for nausea or vomiting. 04/22/17  Yes Medina-Vargas, Monina C, NP  oxyCODONE-acetaminophen (PERCOCET) 10-325 MG tablet Take 1 tablet by mouth every 4 (four) hours as needed for pain.  06/01/18  Yes [provider]  SUMAtriptan (IMITREX) 100 MG tablet Take 50-100 mg by mouth every 2 (two) hours as needed for migraine or headache.  12/29/17  Yes [provider]  tiZANidine (ZANAFLEX) 4 MG tablet Take 4 mg by mouth  3 (three) times daily as needed for muscle spasms.  12/28/17  Yes [provider]  topiramate (TOPAMAX) 100 MG tablet Take 1 tablet (100 mg total) by mouth at bedtime. 12/28/17  Yes Kathrynn Ducking, MD  trazodone (DESYREL) 300 MG tablet Take 1 tablet (300 mg total) by mouth at bedtime as needed for sleep. Patient taking differently: Take 300 mg by mouth at bedtime. sleep 04/22/17  Yes Medina-Vargas, Monina C, NP  vitamin B-12 (CYANOCOBALAMIN) 1000 MCG tablet Take 1 tablet (1,000 mcg total) by mouth daily. 02/19/17  Yes Debbe Odea, MD  vitamin E 100 UNIT capsule Take 100 Units by mouth daily. 03/15/18  Yes [provider]    Physical Exam: Vitals:   07/02/18 1553 07/02/18 2134 07/02/18 2210 07/03/18 0008  BP:  (!) 118/36 132/71 107/67  Pulse:  66 65 (!) 56  Resp:  16    Temp:   97.8 F (36.6 C)   TempSrc:   Oral   SpO2:  98% 100% 97%  Weight: 49 kg     Height: 5\' 5"  (1.651 m)       Physical Exam  Constitutional: She is oriented to person, place, and time.  Frail elderly female Multiple bruises noted on her face  HENT:  Head: Normocephalic.  Dry mucous membranes  Eyes: Pupils are equal, round, and reactive to light. EOM are normal.  Neck: Neck supple.  Cardiovascular: Normal  rate, regular rhythm and intact distal pulses.  Pulmonary/Chest: Effort normal and breath sounds normal. No respiratory distress. She has no wheezes. She has no rales.  Abdominal: Soft. Bowel sounds are normal. She exhibits no distension. There is no tenderness. There is no guarding.  Musculoskeletal: She exhibits no edema.  Left knee: Mildly erythematous.  Slightly decreased range of motion secondary to pain.  No abrasion, increased warmth, or effusion appreciated.  Neurological: She is alert and oriented to person, place, and time. No cranial nerve deficit.  Strength 5 out of 5 in bilateral upper and lower extremities. Sensation to light touch intact throughout.  Skin: Skin is warm and dry.     Labs on Admission: I have personally reviewed following labs and imaging studies  CBC: Recent Labs  Lab 07/02/18 1601  WBC 5.9  HGB 12.6  HCT 41.6  MCV 87.6  PLT 294   Basic Metabolic Panel: Recent Labs  Lab 07/02/18 1601  NA 136  K 3.7  CL 104  CO2 19*  GLUCOSE 101*  BUN 19  CREATININE 1.00  CALCIUM 9.2   GFR: Estimated Creatinine Clearance: 42.2 mL/min (by C-G formula based on SCr of 1 mg/dL). Liver Function Tests: Recent Labs  Lab 07/02/18 1903  AST 12*  ALT 9  ALKPHOS 84  BILITOT 0.7  PROT 5.8*  ALBUMIN 3.2*   No results for input(s): LIPASE, AMYLASE in the last 168 hours. No results for input(s): AMMONIA in the last 168 hours. Coagulation Profile: No results for input(s): INR, PROTIME in the last 168 hours. Cardiac Enzymes: Recent Labs  Lab 07/02/18 2126  TROPONINI <0.03   BNP (last 3 results) No results for input(s): PROBNP in the last 8760 hours. HbA1C: No results for input(s): HGBA1C in the last 72 hours. CBG: Recent Labs  Lab 07/02/18 1600  GLUCAP 98   Lipid Profile: No results for input(s): CHOL, HDL, LDLCALC, TRIG, CHOLHDL, LDLDIRECT in the last 72 hours. Thyroid Function Tests: No results for input(s): TSH, T4TOTAL, FREET4, T3FREE,  THYROIDAB in the last 72 hours. Anemia Panel:  No results for input(s): VITAMINB12, FOLATE, FERRITIN, TIBC, IRON, RETICCTPCT in the last 72 hours. Urine analysis:    Component Value Date/Time   COLORURINE AMBER (A) 07/02/2018 2010   APPEARANCEUR HAZY (A) 07/02/2018 2010   LABSPEC 1.020 07/02/2018 2010   PHURINE 5.0 07/02/2018 2010   GLUCOSEU NEGATIVE 07/02/2018 2010   GLUCOSEU NEGATIVE 04/13/2007 1417   HGBUR NEGATIVE 07/02/2018 2010   [place] NEGATIVE 07/02/2018 2010   KETONESUR 5 (A) 07/02/2018 2010   PROTEINUR 100 (A) 07/02/2018 2010   UROBILINOGEN 0.2 02/04/2015 1800   NITRITE POSITIVE (A) 07/02/2018 2010   LEUKOCYTESUR MODERATE (A) 07/02/2018 2010    Radiological Exams on Admission: Ct Head Wo Contrast  Result Date: 07/02/2018 CLINICAL DATA:  Multiple falls up to 3 times a day since the original fall 06/25/2018. Ataxia. EXAM: CT HEAD WITHOUT CONTRAST TECHNIQUE: Contiguous axial images were obtained from the base of the skull through the vertex without intravenous contrast. COMPARISON:  06/27/2018 FINDINGS: Brain: Late acute to subacute left-sided subdural hematoma measuring up to 5 mm in thickness is noted. This is a new finding since prior. No hydrocephalus. Chronic small vessel ischemic disease of periventricular white matter. No midline shift. No effacement basal cisterns. No large vascular territory infarct. Intra-axial mass. Vascular: Atherosclerosis at the skull base. Hyperdense vessel sign. Skull: Bilateral nasal bone fractures are redemonstrated. No acute skull fracture. Sinuses/Orbits: Intact orbits and globes. Bilateral lens replacements. Other: Clear bilateral mastoids without mastoid effusion. IMPRESSION: 1. New since prior exam is a late acute to subacute left sided subdural hematoma measuring up to 5 mm in thickness. No midline shift. These results were called by telephone at the time of interpretation on 07/02/2018 at 6:59 pm to Dr. Leonia Reader , who verbally  acknowledged these results. 2. Chronic small vessel ischemic disease of periventricular white matter and cerebral atrophy is noted. 3. Bilateral subacute to chronic nasal bone fractures. Electronically Signed   By: Ashley Royalty M.D.   On: 07/02/2018 19:00   Dg Knee Complete 4 Views Left  Result Date: 07/02/2018 CLINICAL DATA:  [place] on 06/25/2018. Persistent knee pain and swelling. EXAM: LEFT KNEE - COMPLETE 4+ VIEW COMPARISON:  04/19/2018 FINDINGS: There is a small knee joint effusion. No evidence of fracture or dislocation. No significant degenerative changes. IMPRESSION: Small knee joint effusion.  Otherwise negative. Electronically Signed   By: Nelson Chimes M.D.   On: 07/02/2018 18:48    EKG: Independently reviewed.  Sinus rhythm, nonspecific ST abnormality.  Assessment/Plan Principal Problem:   Subdural hematoma (HCC) Active Problems:   Depression   Migraine   GERD (gastroesophageal reflux disease)   Protein calorie malnutrition (HCC)   Recurrent falls   UTI (urinary tract infection)   Nasal bone fractures   Knee pain   Normal anion gap metabolic acidosis   Subdural hematoma Currently taking aspirin 81 mg at home. CT head showing late acute to subacute left-sided subdural hematoma measuring up to 5 mm in thickness.  No midline shift.  Patient is AAO x3.  No focal neuro deficits appreciated on exam. -ED provider discussed the case with neurosurgery NP Glenford Peers who recommended repeating the head CT in 6 hours.  Neurosurgery will see the patient in the morning. -Repeat head CT in 6 hours -Frequent neurochecks -Hold aspirin, anticoagulation for DVT prophylaxis  UTI UA showing moderate leukocytes, positive nitrite, many bacteria, and greater than 50 WBCs/ HPF.  Patient is afebrile and does not have leukocytosis. -Macrobid 100 mg twice daily (penicillin allergy) -Urine  culture    Nasal bone fractures CT showing bilateral subacute to chronic nasal bone fractures (reported on  his previous CT done 12/1 as well).  Patient has no complaints and appears comfortable.  -No acute intervention needed at this time.  Outpatient follow-up.  Left knee pain X-ray of left knee showing small joint effusion; otherwise negative.   -Toradol PRN, Tylenol PRN pain  Recurrent falls Multifactorial. Likely secondary to UTI, dehydration, orthostatic hypotension, and home tizanidine, opioid, and benzodiazepine use.  Orthostatics checked in the ED positive.  Patient reports decreased p.o. intake due to loss of teeth from falls and not being able to see dentist.  Reports taking 4 to 5 tablets of Percocet per day for chronic back pain which could also be contributing.  Blood ethanol level negative.  UDS positive for benzodiazepines.  EKG with nonspecific ST abnormality.  Patient denies having chest pain.  Troponin I negative.  B12 checked in May 2019 was 1760.  TSH checked at that time was normal. -PT evaluation -IV fluid hydration -Repeat orthostatics in the morning -Outpatient dentistry follow-up -Patient received Percocet in the ED.  Avoid sedating medications such as opioids, benzodiazepines, muscle relaxers.  Protein calorie malnutrition Abdomen 3.2. BMI 19. -Nutrition consult  Normal anion gap metabolic acidosis Bicarb 19 and anion gap 13.  Likely secondary to recent GI losses. Patient reports having diarrhea for a week, resolved a few days ago. -Repeat BMP in a.m.  Anxiety and depression -Continue home Prozac  History of migraine headaches -Continue home Topamax -Toradol PRN, Tylenol PRN  GERD -Continue PPI  DVT prophylaxis: SCDs Code Status: Patient wishes to be full code. Family Communication: No family available. Disposition Plan: Anticipate discharge to home in 1 to 2 days. Consults called: Neurosurgery Admission status: Observation   Shela Leff MD [facility] Pager [phone]  If 7PM-7AM, please contact night-coverage [email or web address] Password  [email or web address]  07/03/2018, 2:01 AM

## 2018-07-02 NOTE — ED Notes (Signed)
Pt CBG was 98, notified Autumn(RN)

## 2018-07-03 ENCOUNTER — Observation Stay (HOSPITAL_COMMUNITY): Payer: Medicare Other

## 2018-07-03 DIAGNOSIS — R296 Repeated falls: Secondary | ICD-10-CM

## 2018-07-03 DIAGNOSIS — K219 Gastro-esophageal reflux disease without esophagitis: Secondary | ICD-10-CM

## 2018-07-03 DIAGNOSIS — E46 Unspecified protein-calorie malnutrition: Secondary | ICD-10-CM

## 2018-07-03 DIAGNOSIS — S065X9A Traumatic subdural hemorrhage with loss of consciousness of unspecified duration, initial encounter: Secondary | ICD-10-CM | POA: Diagnosis not present

## 2018-07-03 DIAGNOSIS — S022XXD Fracture of nasal bones, subsequent encounter for fracture with routine healing: Secondary | ICD-10-CM | POA: Diagnosis not present

## 2018-07-03 DIAGNOSIS — E872 Acidosis, unspecified: Secondary | ICD-10-CM

## 2018-07-03 DIAGNOSIS — M25569 Pain in unspecified knee: Secondary | ICD-10-CM

## 2018-07-03 DIAGNOSIS — N3000 Acute cystitis without hematuria: Secondary | ICD-10-CM

## 2018-07-03 DIAGNOSIS — S022XXA Fracture of nasal bones, initial encounter for closed fracture: Secondary | ICD-10-CM

## 2018-07-03 DIAGNOSIS — N390 Urinary tract infection, site not specified: Secondary | ICD-10-CM

## 2018-07-03 LAB — BASIC METABOLIC PANEL
ANION GAP: 9 (ref 5–15)
BUN: 18 mg/dL (ref 8–23)
CO2: 19 mmol/L — ABNORMAL LOW (ref 22–32)
Calcium: 8.6 mg/dL — ABNORMAL LOW (ref 8.9–10.3)
Chloride: 112 mmol/L — ABNORMAL HIGH (ref 98–111)
Creatinine, Ser: 0.96 mg/dL (ref 0.44–1.00)
GFR calc Af Amer: >60 mL/min (ref >60)
GFR calc non Af Amer: >60 mL/min (ref >60)
Glucose, Bld: 83 mg/dL (ref 70–99)
POTASSIUM: 3.6 mmol/L (ref 3.5–5.1)
Sodium: 140 mmol/L (ref 135–145)

## 2018-07-03 MED ORDER — ADULT MULTIVITAMIN W/MINERALS CH
1.0000 | ORAL_TABLET | Freq: Every day | ORAL | Status: DC
  Administered 2018-07-03 – 2018-07-07 (×5): 1 via ORAL
  Filled 2018-07-03 (×5): qty 1

## 2018-07-03 MED ORDER — NITROFURANTOIN MONOHYD MACRO 100 MG PO CAPS
100.0000 mg | ORAL_CAPSULE | Freq: Two times a day (BID) | ORAL | Status: DC
  Administered 2018-07-03 – 2018-07-07 (×10): 100 mg via ORAL
  Filled 2018-07-03 (×10): qty 1

## 2018-07-03 MED ORDER — ENSURE ENLIVE PO LIQD: 237.0000 mL | Freq: Two times a day (BID) | ORAL | Status: DC

## 2018-07-03 MED ORDER — OXYCODONE-ACETAMINOPHEN 5-325 MG PO TABS
1.0000 | ORAL_TABLET | ORAL | Status: DC | PRN
  Administered 2018-07-03 – 2018-07-04 (×5): 2 via ORAL
  Administered 2018-07-04 – 2018-07-05 (×4): 1 via ORAL
  Administered 2018-07-05: 2 via ORAL
  Administered 2018-07-05 – 2018-07-06 (×2): 1 via ORAL
  Administered 2018-07-06 (×2): 2 via ORAL
  Administered 2018-07-06 (×2): 1 via ORAL
  Administered 2018-07-07 (×3): 2 via ORAL
  Filled 2018-07-03 (×2): qty 2
  Filled 2018-07-03 (×3): qty 1
  Filled 2018-07-03 (×3): qty 2
  Filled 2018-07-03 (×2): qty 1
  Filled 2018-07-03: qty 2
  Filled 2018-07-03: qty 1
  Filled 2018-07-03 (×5): qty 2
  Filled 2018-07-03 (×3): qty 1

## 2018-07-03 MED ORDER — ENSURE ENLIVE PO LIQD
237.0000 mL | Freq: Three times a day (TID) | ORAL | Status: DC
  Administered 2018-07-03 – 2018-07-07 (×11): 237 mL via ORAL

## 2018-07-03 MED ORDER — ACETAMINOPHEN 325 MG PO TABS
650.0000 mg | ORAL_TABLET | Freq: Four times a day (QID) | ORAL | Status: DC | PRN
  Administered 2018-07-05: 650 mg via ORAL
  Filled 2018-07-03 (×2): qty 2

## 2018-07-03 NOTE — Progress Notes (Signed)
PROGRESS NOTE    Dorothy White  GNF:[id number] DOB: 09/17/1950 DOA: 07/02/2018 PCP: Maurice Small, MD    Brief Narrative:  Dorothy White is a 67 y.o. female with medical history significant of anxiety, depression, GERD, hyperlipidemia, nonischemic cardiomyopathy, CVA presenting to the hospital for evaluation of falls.  Patient states she fell a few days ago and got a concussion.  She was in the bathroom and slipped, fell and hit her head on the toilet.  States since then she has had 3-4 additional episodes of falls at home.  Assessment & Plan:   Principal Problem:   Subdural hematoma (HCC) Active Problems:   Depression   Migraine   GERD (gastroesophageal reflux disease)   Protein calorie malnutrition (HCC)   Recurrent falls   UTI (urinary tract infection)   Nasal bone fractures   Knee pain   Normal anion gap metabolic acidosis   Recurrent Falls , she denies syncope:  Probably secondary to orthostatic hypotension, dehydration from recent diarrhea and polypharmacy ( percocet use and benzodiazepine use).  - EKG shows non specific st t wave abnormalities.  troponins have been negative.  Recommend to get echocardiogram.  Repeat orthostatic vital signs are negative.  PT evaluation recommending CIR. Order placed.    UTI:  Urine cultures ordered.  On MACROBID    Sub dural hematoma:  - repeat CT shows stable hematoma.  Non focal at this time.  Appreciate neuro surgery recommendations.  Holding all anti coagulants.   Multiple sub acute nasal bone fractures;  Pain control.    Protein calorie malnutrition.  Nutrition consulted.   Mild non anion gap acidosis  Probably from dehydration.   GERD  Stable.     DVT prophylaxis:scd's Code Status:full code.  Family Communication: None at bedside.  Disposition Plan: pending clinical improvement.    Consultants:   Neurosurgery.   Procedures: None.  Antimicrobials:none.   Subjective: Appears to be in mild  distress from headache and pain all over.  She also complaints of dizziness on standing , .  She reports some diplopia since the fall.   Objective: Vitals:   07/02/18 2134 07/02/18 2210 07/03/18 0008 07/03/18 0739  BP: (!) 118/36 132/71 107/67 109/69  Pulse: 66 65 (!) 56 72  Resp: 16   17  Temp:  97.8 F (36.6 C)  98.1 F (36.7 C)  TempSrc:  Oral  Oral  SpO2: 98% 100% 97% 99%  Weight:      Height:        Intake/Output Summary (Last 24 hours) at 07/03/2018 1401 Last data filed at 07/03/2018 0600 Gross per 24 hour  Intake 750 ml  Output -  Net 750 ml   Filed Weights   07/02/18 1553  Weight: 49 kg    Examination:  General exam:inmild distress from headache and dizziness. Bruising over the face.  Respiratory system: Clear to auscultation. Respiratory effort normal. Cardiovascular system: S1 & S2 heard, RRR. No JVD, murmurs, Gastrointestinal system: Abdomen is nondistended, soft and nontender. No organomegaly or masses felt. Normal bowel sounds heard. Central nervous system: Alert and oriented. No focal neurological deficits. Extremities: Symmetric 5 x 5 power. Skin: No rashes, lesions or ulcers Psychiatry:  Mood & affect appropriate.     Data Reviewed: I have personally reviewed following labs and imaging studies  CBC: Recent Labs  Lab 07/02/18 1601  WBC 5.9  HGB 12.6  HCT 41.6  MCV 87.6  PLT 846   Basic Metabolic Panel: Recent Labs  Lab 07/02/18  1601 07/03/18 0338  NA 136 140  K 3.7 3.6  CL 104 112*  CO2 19* 19*  GLUCOSE 101* 83  BUN 19 18  CREATININE 1.00 0.96  CALCIUM 9.2 8.6*   GFR: Estimated Creatinine Clearance: 44 mL/min (by C-G formula based on SCr of 0.96 mg/dL). Liver Function Tests: Recent Labs  Lab 07/02/18 1903  AST 12*  ALT 9  ALKPHOS 84  BILITOT 0.7  PROT 5.8*  ALBUMIN 3.2*   No results for input(s): LIPASE, AMYLASE in the last 168 hours. No results for input(s): AMMONIA in the last 168 hours. Coagulation Profile: No  results for input(s): INR, PROTIME in the last 168 hours. Cardiac Enzymes: Recent Labs  Lab 07/02/18 2126  TROPONINI <0.03   BNP (last 3 results) No results for input(s): PROBNP in the last 8760 hours. HbA1C: No results for input(s): HGBA1C in the last 72 hours. CBG: Recent Labs  Lab 07/02/18 1600  GLUCAP 98   Lipid Profile: No results for input(s): CHOL, HDL, LDLCALC, TRIG, CHOLHDL, LDLDIRECT in the last 72 hours. Thyroid Function Tests: No results for input(s): TSH, T4TOTAL, FREET4, T3FREE, THYROIDAB in the last 72 hours. Anemia Panel: No results for input(s): VITAMINB12, FOLATE, FERRITIN, TIBC, IRON, RETICCTPCT in the last 72 hours. Sepsis Labs: No results for input(s): PROCALCITON, LATICACIDVEN in the last 168 hours.  No results found for this or any previous visit (from the past 240 hour(s)).       Radiology Studies: Ct Head Wo Contrast  Result Date: 07/03/2018 CLINICAL DATA:  67 year old female with subdural hemorrhage follow-up exam. EXAM: CT HEAD WITHOUT CONTRAST TECHNIQUE: Contiguous axial images were obtained from the base of the skull through the vertex without intravenous contrast. COMPARISON:  Head CT dated 07/02/2018 FINDINGS: Brain: The previously seen left subdural hemorrhage appears similar in size to the prior CT although slightly less conspicuous. No new hemorrhage. Mild diffuse cortical atrophy and chronic microvascular ischemic changes. No mass effect or midline shift. Vascular: No hyperdense vessel or unexpected calcification. Skull: Normal. Negative for fracture or focal lesion. Sinuses/Orbits: No acute finding. Other: None IMPRESSION: No significant interval change in the size of left-sided subdural hemorrhage. No new hemorrhage. Electronically Signed   By: Anner Crete M.D.   On: 07/03/2018 02:54   Ct Head Wo Contrast  Result Date: 07/02/2018 CLINICAL DATA:  Multiple falls up to 3 times a day since the original fall 06/25/2018. Ataxia. EXAM: CT HEAD  WITHOUT CONTRAST TECHNIQUE: Contiguous axial images were obtained from the base of the skull through the vertex without intravenous contrast. COMPARISON:  06/27/2018 FINDINGS: Brain: Late acute to subacute left-sided subdural hematoma measuring up to 5 mm in thickness is noted. This is a new finding since prior. No hydrocephalus. Chronic small vessel ischemic disease of periventricular white matter. No midline shift. No effacement basal cisterns. No large vascular territory infarct. Intra-axial mass. Vascular: Atherosclerosis at the skull base. Hyperdense vessel sign. Skull: Bilateral nasal bone fractures are redemonstrated. No acute skull fracture. Sinuses/Orbits: Intact orbits and globes. Bilateral lens replacements. Other: Clear bilateral mastoids without mastoid effusion. IMPRESSION: 1. New since prior exam is a late acute to subacute left sided subdural hematoma measuring up to 5 mm in thickness. No midline shift. These results were called by telephone at the time of interpretation on 07/02/2018 at 6:59 pm to Dr. Leonia Reader , who verbally acknowledged these results. 2. Chronic small vessel ischemic disease of periventricular white matter and cerebral atrophy is noted. 3. Bilateral subacute to chronic nasal  bone fractures. Electronically Signed   By: Ashley Royalty M.D.   On: 07/02/2018 19:00   Dg Knee Complete 4 Views Left  Result Date: 07/02/2018 CLINICAL DATA:  [place] on 06/25/2018. Persistent knee pain and swelling. EXAM: LEFT KNEE - COMPLETE 4+ VIEW COMPARISON:  04/19/2018 FINDINGS: There is a small knee joint effusion. No evidence of fracture or dislocation. No significant degenerative changes. IMPRESSION: Small knee joint effusion.  Otherwise negative. Electronically Signed   By: Nelson Chimes M.D.   On: 07/02/2018 18:48        Scheduled Meds: . FLUoxetine  80 mg Oral Daily  . magnesium oxide  400 mg Oral QHS  . nitrofurantoin (macrocrystal-monohydrate)  100 mg Oral Q12H  . pantoprazole  40 mg  Oral Daily  . topiramate  100 mg Oral QHS  . vitamin B-12  1,000 mcg Oral Daily  . vitamin E  100 Units Oral Daily   Continuous Infusions:   LOS: 0 days    Time spent: 35 minutes.     Hosie Poisson, MD [facility] Pager [phone]   If 7PM-7AM, please contact night-coverage [email or web address] Password [email or web address] 07/03/2018, 2:01 PM

## 2018-07-03 NOTE — Progress Notes (Signed)
Initial Nutrition Assessment  DOCUMENTATION CODES:   Underweight, Severe malnutrition in context of chronic illness  INTERVENTION:    Ensure Enlive po TID, each supplement provides 350 kcal and 20 grams of protein  Provide MVI daily  NUTRITION DIAGNOSIS:   Severe Malnutrition related to chronic illness(CVA/depression) as evidenced by severe muscle depletion, energy intake < or equal to 75% for > or equal to 1 month, moderate fat depletion, mild fat depletion.  GOAL:   Patient will meet greater than or equal to 90% of their needs  MONITOR:   PO intake, Supplement acceptance, Labs, Weight trends, I & O's  REASON FOR ASSESSMENT:   Consult Assessment of nutrition requirement/status  ASSESSMENT:   Patient with PMH significant for anxiety, depression, GERD, HLD, nonischemic cardiomyopathy, and CVA. Presents this admission with complaint of multiple falls, Admitted for SDH and multiple sub acute nasal bone fractures.    Pt endorses PO intake decreased around 4 months ago due to not wanting to prepare food. She typically snacks through out the day on food options that consist of ham sandwiches, cereal with milk, cheese with crackers, chicken soup, beef soup, and drinks multiple Cokes/Sweet Tea. She denies any loss in appetite. Meal completions lack recordings. Pt ate lunch this afternoon but was unable to quantify how much she completed. Discussed the importance of protein intake for preservation of lean body mass. Pt amendable to Ensure.   Pt reports a UBW of 112-113 lb and a recent wt loss of 4 lb. Records indicate pt weighed 120 lb on 11/15/17 and 108 lb this admission (10% wt loss in 8 months, insignificant for time frame). Nutrition-Focused physical exam completed.   Medications reviewed and include: MAG-OX, Vit B12, Vit E Labs reviewed.   NUTRITION - FOCUSED PHYSICAL EXAM:    Most Recent Value  Orbital Region  Mild depletion  Upper Arm Region  Moderate depletion  Thoracic  and Lumbar Region  Moderate depletion  Buccal Region  Mild depletion  Temple Region  Severe depletion  Clavicle Bone Region  Severe depletion  Clavicle and Acromion Bone Region  Severe depletion  Scapular Bone Region  Moderate depletion  Dorsal Hand  Mild depletion  Patellar Region  Severe depletion  Anterior Thigh Region  Severe depletion  Posterior Calf Region  Severe depletion  Edema (RD Assessment)  None  Hair  Reviewed  Eyes  Reviewed  Mouth  Reviewed  Skin  Reviewed  Nails  Reviewed     Diet Order:   Diet Order            Diet regular Room service appropriate? Yes; Fluid consistency: Thin  Diet effective now              EDUCATION NEEDS:   Education needs have been addressed  Skin:  Skin Assessment: Skin Integrity Issues: Skin Integrity Issues:: Other (Comment) Other: multiple cuts  Last BM:  PTA  Height:   Ht Readings from Last 1 Encounters:  07/02/18 5\' 5"  (1.651 m)    Weight:   Wt Readings from Last 1 Encounters:  07/02/18 49 kg    Ideal Body Weight:  56.8 kg  BMI:  Body mass index is 17.97 kg/m.  Estimated Nutritional Needs:   Kcal:  1500-1700 kcal  Protein:  75-90 grams   Fluid:  >/= 1.5 L/day   Mariana Single RD, LDN Clinical Nutrition Pager # - [phone]

## 2018-07-03 NOTE — Evaluation (Signed)
Physical Therapy Evaluation Patient Details Name: Dorothy White MRN: [id number] DOB: Dec 26, 1950 Today's Date: 07/03/2018   History of Present Illness  Dorothy White is a 67 y.o. female with medical history significant of anxiety, depression, GERD, hyperlipidemia, L hip hemiarthroplasty, nonischemic cardiomyopathy, CVA presenting to the hospital for evaluation of falls.  Patient states she fell a few days ago and got a concussion.  She was in the bathroom and slipped, fell and hit her head on the toilet.  States since then she has had 3-4 additional episodes of falls at home.  CT showed L SDH.   Clinical Impression  Pt admitted with above diagnosis. Pt currently with functional limitations due to the deficits listed below (see PT Problem List). Pt having balance deficits that she reports are worse than before these recent falling episodes (she had completed outpt PT for balance and had seen good results until this). Needing min A for stability with ambulation. She is also experiencing STM deficits and difficulty with familiar tasks such as using her cell phone. Would benefit from OT eval as well as SLP for cognition. Not safe to be alone currently but feel that with some intense rehab she could return to mod I level and return to home. If this is not an option, recommend SNF.  Pt will benefit from skilled PT to increase their independence and safety with mobility to allow discharge to the venue listed below.       Follow Up Recommendations CIR    Equipment Recommendations  None recommended by PT    Recommendations for Other Services Rehab consult;OT consult;Speech consult     Precautions / Restrictions Precautions Precautions: Fall Restrictions Weight Bearing Restrictions: No      Mobility  Bed Mobility Overal bed mobility: Needs Assistance Bed Mobility: Sit to Supine       Sit to supine: Supervision   General bed mobility comments: supervision given for return to bed  because after pt leaned fwd to straighten bed covers before getting in, she became dizzy and still needed to turn around. Pt able to go sit>supine without assist  Transfers Overall transfer level: Needs assistance Equipment used: Straight cane Transfers: Sit to/from Stand Sit to Stand: Min guard         General transfer comment: pt very fearful of falling so all mvmt slow and guarded. Min-guard A as pt was unsteady with initial standing  Ambulation/Gait Ambulation/Gait assistance: Min assist Gait Distance (Feet): 125 Feet Assistive device: Straight cane Gait Pattern/deviations: Step-through pattern;Decreased stride length;Narrow base of support;Drifts right/left Gait velocity: decreased Gait velocity interpretation: <1.8 ft/sec, indicate of risk for recurrent falls General Gait Details: pt's cane is too short but she knows this and it was her best friend's who passed away. Min A during ambulation, pt unsteady with turning and with decreased ability to change gait speed. Very short step length and decreased pace.   Stairs            Wheelchair Mobility    Modified Rankin (Stroke Patients Only) Modified Rankin (Stroke Patients Only) Pre-Morbid Rankin Score: Slight disability Modified Rankin: Moderately severe disability     Balance Overall balance assessment: History of Falls;Needs assistance Sitting-balance support: No upper extremity supported Sitting balance-Leahy Scale: Fair     Standing balance support: Single extremity supported Standing balance-Leahy Scale: Fair Standing balance comment: pt can maintain static stance without support but loses balance with dynamic activity without support  Pertinent Vitals/Pain Pain Assessment: Faces Faces Pain Scale: Hurts little more Pain Location: head and face Pain Descriptors / Indicators: Dull;Sore;Aching Pain Intervention(s): Limited activity within patient's tolerance;Monitored  during session                                                                             BP sitting 123/84    standing 131/83  Home Living Family/patient expects to be discharged to:: Private residence Living Arrangements: Non-relatives/Friends Available Help at Discharge: Friend(s);Available PRN/intermittently Type of Home: House Home Access: Level entry     Home Layout: Multi-level Home Equipment: Cane - single point;Bedside commode;Walker - 2 wheels Additional Comments: pt lives with a roommate who is not often home. Has to go upstairs to shower    Prior Function Level of Independence: Independent         Comments: went through outpt PT for balance because of her falls and reports that it has been very helpful and hasn't fallen in the past month since it ended until this episode. She has no memory of this fall or anything leading up to it. Her roommate found her down in the middle of the night.      Hand Dominance   Dominant Hand: Right    Extremity/Trunk Assessment   Upper Extremity Assessment Upper Extremity Assessment: Defer to OT evaluation    Lower Extremity Assessment Lower Extremity Assessment: Generalized weakness    Cervical / Trunk Assessment Cervical / Trunk Assessment: Normal  Communication   Communication: No difficulties  Cognition Arousal/Alertness: Awake/alert Behavior During Therapy: Flat affect Overall Cognitive Status: Impaired/Different from baseline                                 General Comments: is having difficulty with STM and showing sign of apraxia including having trouble using her cell phone      General Comments General comments (skin integrity, edema, etc.): significant bruising on face and head    Exercises     Assessment/Plan    PT Assessment Patient needs continued PT services  PT Problem List Decreased strength;Decreased activity tolerance;Decreased balance;Decreased mobility;Decreased cognition;Decreased  safety awareness;Decreased knowledge of use of DME;Decreased knowledge of precautions;Pain       PT Treatment Interventions DME instruction;Gait training;Stair training;Functional mobility training;Therapeutic activities;Therapeutic exercise;Balance training;Patient/family education;Cognitive remediation;Neuromuscular re-education    PT Goals (Current goals can be found in the Care Plan section)  Acute Rehab PT Goals Patient Stated Goal: not fall PT Goal Formulation: With patient Time For Goal Achievement: 07/17/18 Potential to Achieve Goals: Good Additional Goals Additional Goal #1: Pt will score >46 on Berg balance with cane    Frequency Min 4X/week   Barriers to discharge Decreased caregiver support alone most of the time    Co-evaluation               AM-PAC PT "6 Clicks" Mobility  Outcome Measure Help needed turning from your back to your side while in a flat bed without using bedrails?: None Help needed moving from lying on your back to sitting on the side of a flat bed without using bedrails?: A Little Help needed moving to  and from a bed to a chair (including a wheelchair)?: A Little Help needed standing up from a chair using your arms (e.g., wheelchair or bedside chair)?: A Little Help needed to walk in hospital room?: A Little Help needed climbing 3-5 steps with a railing? : A Lot 6 Click Score: 18    End of Session Equipment Utilized During Treatment: Gait belt Activity Tolerance: Patient tolerated treatment well Patient left: in bed;with call bell/phone within reach;with bed alarm set Nurse Communication: Mobility status PT Visit Diagnosis: Unsteadiness on feet (R26.81);Repeated falls (R29.6);Difficulty in walking, not elsewhere classified (R26.2);Pain;Dizziness and giddiness (R42) Pain - part of body: (head)    Time: 2426-8341 PT Time Calculation (min) (ACUTE ONLY): 24 min   Charges:   PT Evaluation $PT Eval Moderate Complexity: 1 Mod PT  Treatments $Gait Training: 8-22 mins        Leighton Roach, [place]  Pager [phone] Office [place] 07/03/2018, 12:43 PM

## 2018-07-03 NOTE — Progress Notes (Signed)
Patient ID: Dorothy White, female   DOB: 12-02-1950, 67 y.o.   MRN: [id number] Follow-up CT stable minimal to no clinical significance of very small subdural fluid collection on the left more than likely related to extensive cortical atrophy disproportionate to her age of 72. No neurosurgical intervention needed.

## 2018-07-04 ENCOUNTER — Observation Stay (HOSPITAL_COMMUNITY): Payer: Medicare Other

## 2018-07-04 DIAGNOSIS — I951 Orthostatic hypotension: Secondary | ICD-10-CM | POA: Diagnosis present

## 2018-07-04 DIAGNOSIS — W19XXXA Unspecified fall, initial encounter: Secondary | ICD-10-CM | POA: Diagnosis present

## 2018-07-04 DIAGNOSIS — F419 Anxiety disorder, unspecified: Secondary | ICD-10-CM | POA: Diagnosis present

## 2018-07-04 DIAGNOSIS — M25562 Pain in left knee: Secondary | ICD-10-CM | POA: Diagnosis present

## 2018-07-04 DIAGNOSIS — S065X9A Traumatic subdural hemorrhage with loss of consciousness of unspecified duration, initial encounter: Secondary | ICD-10-CM | POA: Diagnosis not present

## 2018-07-04 DIAGNOSIS — N390 Urinary tract infection, site not specified: Secondary | ICD-10-CM | POA: Diagnosis present

## 2018-07-04 DIAGNOSIS — G9340 Encephalopathy, unspecified: Secondary | ICD-10-CM | POA: Diagnosis present

## 2018-07-04 DIAGNOSIS — Z8673 Personal history of transient ischemic attack (TIA), and cerebral infarction without residual deficits: Secondary | ICD-10-CM | POA: Diagnosis not present

## 2018-07-04 DIAGNOSIS — Z681 Body mass index (BMI) 19 or less, adult: Secondary | ICD-10-CM | POA: Diagnosis not present

## 2018-07-04 DIAGNOSIS — R269 Unspecified abnormalities of gait and mobility: Secondary | ICD-10-CM

## 2018-07-04 DIAGNOSIS — Z880 Allergy status to penicillin: Secondary | ICD-10-CM | POA: Diagnosis not present

## 2018-07-04 DIAGNOSIS — Z885 Allergy status to narcotic agent status: Secondary | ICD-10-CM | POA: Diagnosis not present

## 2018-07-04 DIAGNOSIS — R55 Syncope and collapse: Secondary | ICD-10-CM | POA: Diagnosis not present

## 2018-07-04 DIAGNOSIS — I428 Other cardiomyopathies: Secondary | ICD-10-CM | POA: Diagnosis present

## 2018-07-04 DIAGNOSIS — S065X0A Traumatic subdural hemorrhage without loss of consciousness, initial encounter: Secondary | ICD-10-CM | POA: Diagnosis present

## 2018-07-04 DIAGNOSIS — B9620 Unspecified Escherichia coli [E. coli] as the cause of diseases classified elsewhere: Secondary | ICD-10-CM | POA: Diagnosis present

## 2018-07-04 DIAGNOSIS — E872 Acidosis: Secondary | ICD-10-CM | POA: Diagnosis present

## 2018-07-04 DIAGNOSIS — K5792 Diverticulitis of intestine, part unspecified, without perforation or abscess without bleeding: Secondary | ICD-10-CM | POA: Diagnosis present

## 2018-07-04 DIAGNOSIS — S022XXD Fracture of nasal bones, subsequent encounter for fracture with routine healing: Secondary | ICD-10-CM | POA: Diagnosis not present

## 2018-07-04 DIAGNOSIS — E43 Unspecified severe protein-calorie malnutrition: Secondary | ICD-10-CM | POA: Diagnosis present

## 2018-07-04 DIAGNOSIS — K219 Gastro-esophageal reflux disease without esophagitis: Secondary | ICD-10-CM | POA: Diagnosis present

## 2018-07-04 DIAGNOSIS — F329 Major depressive disorder, single episode, unspecified: Secondary | ICD-10-CM | POA: Diagnosis present

## 2018-07-04 DIAGNOSIS — E860 Dehydration: Secondary | ICD-10-CM | POA: Diagnosis present

## 2018-07-04 DIAGNOSIS — G43909 Migraine, unspecified, not intractable, without status migrainosus: Secondary | ICD-10-CM | POA: Diagnosis present

## 2018-07-04 DIAGNOSIS — F1390 Sedative, hypnotic, or anxiolytic use, unspecified, uncomplicated: Secondary | ICD-10-CM | POA: Diagnosis present

## 2018-07-04 DIAGNOSIS — R296 Repeated falls: Secondary | ICD-10-CM | POA: Diagnosis present

## 2018-07-04 DIAGNOSIS — E785 Hyperlipidemia, unspecified: Secondary | ICD-10-CM | POA: Diagnosis present

## 2018-07-04 DIAGNOSIS — S069X4A Unspecified intracranial injury with loss of consciousness of 6 hours to 24 hours, initial encounter: Secondary | ICD-10-CM

## 2018-07-04 DIAGNOSIS — F0390 Unspecified dementia without behavioral disturbance: Secondary | ICD-10-CM | POA: Diagnosis present

## 2018-07-04 DIAGNOSIS — W010XXA Fall on same level from slipping, tripping and stumbling without subsequent striking against object, initial encounter: Secondary | ICD-10-CM | POA: Diagnosis present

## 2018-07-04 DIAGNOSIS — S022XXA Fracture of nasal bones, initial encounter for closed fracture: Secondary | ICD-10-CM | POA: Diagnosis present

## 2018-07-04 LAB — ECHOCARDIOGRAM COMPLETE
Height: 65 in
Weight: 1728 oz

## 2018-07-04 LAB — HIV ANTIBODY (ROUTINE TESTING W REFLEX): HIV Screen 4th Generation wRfx: NONREACTIVE

## 2018-07-04 NOTE — Plan of Care (Signed)
  Problem: Education: Goal: Knowledge of General Education information will improve Description: Including pain rating scale, medication(s)/side effects and non-pharmacologic comfort measures Outcome: Progressing   Problem: Clinical Measurements: Goal: Ability to maintain clinical measurements within normal limits will improve Outcome: Progressing Goal: Will remain free from infection Outcome: Progressing   

## 2018-07-04 NOTE — Evaluation (Signed)
Occupational Therapy Evaluation Patient Details Name: Dorothy White MRN: [id number] DOB: 12/11/1950 Today's Date: 07/04/2018    History of Present Illness Dorothy White is a 67 y.o. female with medical history significant of anxiety, depression, GERD, hyperlipidemia, L hip hemiarthroplasty, nonischemic cardiomyopathy, CVA presenting to the hospital for evaluation of falls.  Patient states she fell a few days ago and got a concussion.  She was in the bathroom and slipped, fell and hit her head on the toilet.  States since then she has had 3-4 additional episodes of falls at home.  CT showed L SDH.    Clinical Impression   PTA, pt was living with a roommate and was independent. Pt currently requiring Min A for LB ADLs and functional mobility due to decreased balance. Pt presenting with decreased cognition as seen by poor ST memory, problem solving, and processing. Pt concerned about her cognition stating "I feel slow and I am not remembering things." Pt motivated to participate in therapy and return to PLOF. Pt will require further acute OT to facilitate safe dc. Recommend dc to CIR for intensive OT to optimize safety and independence with ADLs/IADLs and facilitate pt reaching Mod I level to return home safely.   .     Follow Up Recommendations  CIR;Supervision/Assistance - 24 hour    Equipment Recommendations  Other (comment);Tub/shower seat(Defer to next venue)    Recommendations for Other Services PT consult;Rehab consult;Speech consult     Precautions / Restrictions Precautions Precautions: Fall Restrictions Weight Bearing Restrictions: No      Mobility Bed Mobility Overal bed mobility: Needs Assistance Bed Mobility: Sit to Supine       Sit to supine: Supervision   General bed mobility comments: supervision given for return to bed because after pt leaned fwd to straighten bed covers before getting in, she became dizzy and still needed to turn around. Pt able to go  sit>supine without assist  Transfers Overall transfer level: Needs assistance Equipment used: Straight cane Transfers: Sit to/from Stand Sit to Stand: Min guard         General transfer comment: Min Guard A for sit<>Stand. Pt fearful and unsteady.     Balance Overall balance assessment: History of Falls;Needs assistance Sitting-balance support: No upper extremity supported Sitting balance-Leahy Scale: Fair     Standing balance support: Single extremity supported Standing balance-Leahy Scale: Fair                             ADL either performed or assessed with clinical judgement   ADL Overall ADL's : Needs assistance/impaired Eating/Feeding: Supervision/ safety;Set up;Sitting   Grooming: Set up;Supervision/safety;Standing   Upper Body Bathing: Set up;Supervision/ safety;Sitting   Lower Body Bathing: Minimal assistance;Sit to/from stand   Upper Body Dressing : Supervision/safety;Set up;Sitting   Lower Body Dressing: Minimal assistance;Sit to/from stand Lower Body Dressing Details (indicate cue type and reason): Pt donning shoes and requiring Min A for dynamic standing balance Toilet Transfer: Minimal assistance;Ambulation;Min guard(SImulated to recliner)           Functional mobility during ADLs: Minimal assistance General ADL Comments: Pt presenting with decreased balance and cognition. Pt with poor memeory, problem solving, processing, and attention.      Vision         Perception     Praxis      Pertinent Vitals/Pain Pain Assessment: Faces Faces Pain Scale: Hurts little more Pain Location: head and face Pain Descriptors /  Indicators: Dull;Sore;Aching Pain Intervention(s): Monitored during session;Limited activity within patient's tolerance;Repositioned     Hand Dominance Right   Extremity/Trunk Assessment Upper Extremity Assessment Upper Extremity Assessment: Generalized weakness;RUE deficits/detail RUE Deficits / Details: Noting  decreased strength at RUE and decreased coorindation during finger opposition   Lower Extremity Assessment Lower Extremity Assessment: Defer to PT evaluation   Cervical / Trunk Assessment Cervical / Trunk Assessment: Normal   Communication Communication Communication: No difficulties   Cognition Arousal/Alertness: Awake/alert Behavior During Therapy: Flat affect Overall Cognitive Status: Impaired/Different from baseline Area of Impairment: Attention;Memory;Following commands;Safety/judgement;Awareness;Problem solving                   Current Attention Level: Sustained Memory: Decreased short-term memory Following Commands: Follows one step commands with increased time;Follows multi-step commands inconsistently;Follows multi-step commands with increased time Safety/Judgement: Decreased awareness of safety Awareness: Emergent Problem Solving: Slow processing;Requires verbal cues General Comments: Decreased ST memory and recalling 2/3 memory words. Pt reporting she is worried because of her memory. Pt not remembering working with PT yesterday. Pt slow to porcess and requires significant amount of time. When locating her room, pt requiring Min A  for use of signs.    General Comments  significant bruising on face. SpO2 dropping to low 80s during functional mobility on RA.     Exercises     Shoulder Instructions      Home Living Family/patient expects to be discharged to:: Private residence Living Arrangements: Non-relatives/Friends Available Help at Discharge: Friend(s);Available PRN/intermittently Type of Home: House Home Access: Level entry     Home Layout: Multi-level Alternate Level Stairs-Number of Steps: flight Alternate Level Stairs-Rails: Right Bathroom Shower/Tub: Hospital doctor Toilet: Handicapped height     Home Equipment: Cane - single point;Bedside commode;Walker - 2 wheels   Additional Comments: pt lives with a roommate who is not often  home. Has to go upstairs to shower      Prior Functioning/Environment Level of Independence: Independent        Comments: Pt reports she was independent with ADLs, IADLs, and driving. She went through outpt PT for balance because of her falls and reports that it has been very helpful and hasn't fallen in the past month since it ended until this episode. She has no memory of this fall or anything leading up to it. Her roommate found her down in the middle of the night.         OT Problem List: Decreased range of motion;Decreased strength;Decreased activity tolerance;Impaired balance (sitting and/or standing);Pain;Decreased safety awareness;Decreased knowledge of use of DME or AE      OT Treatment/Interventions: Self-care/ADL training;Therapeutic exercise;Energy conservation;DME and/or AE instruction;Therapeutic activities;Patient/family education    OT Goals(Current goals can be found in the care plan section) Acute Rehab OT Goals Patient Stated Goal: "Remember things again" OT Goal Formulation: With patient Time For Goal Achievement: 07/18/18 Potential to Achieve Goals: Good  OT Frequency: Min 2X/week   Barriers to D/C:            Co-evaluation              AM-PAC OT "6 Clicks" Daily Activity     Outcome Measure Help from another person eating meals?: None Help from another person taking care of personal grooming?: None Help from another person toileting, which includes using toliet, bedpan, or urinal?: A Little Help from another person bathing (including washing, rinsing, drying)?: A Little Help from another person to put on and taking off regular  upper body clothing?: A Little Help from another person to put on and taking off regular lower body clothing?: A Little 6 Click Score: 20   End of Session Nurse Communication: Mobility status  Activity Tolerance: Patient tolerated treatment well Patient left: in chair;with call bell/phone within reach;with chair alarm  set  OT Visit Diagnosis: Unsteadiness on feet (R26.81);Other abnormalities of gait and mobility (R26.89);Muscle weakness (generalized) (M62.81);Other symptoms and signs involving cognitive function;Pain Pain - Right/Left: Right Pain - part of body: (face)                Time: 6333-5456 OT Time Calculation (min): 24 min Charges:  OT General Charges $OT Visit: 1 Visit OT Evaluation $OT Eval Moderate Complexity: 1 Mod OT Treatments $Self Care/Home Management : 8-22 mins  Jerika Wales MSOT, OTR/L Acute Rehab Pager: [phone] Office: [place] 07/04/2018, 9:02 AM

## 2018-07-04 NOTE — Progress Notes (Signed)
PROGRESS NOTE    Dorothy White  WJX:[id number] DOB: 1951/06/02 DOA: 07/02/2018 PCP: Maurice Small, MD    Brief Narrative:  Dorothy White is a 67 y.o. female with medical history significant of anxiety, depression, GERD, hyperlipidemia, nonischemic cardiomyopathy, CVA presenting to the hospital for evaluation of falls.  Patient states she fell a few days ago and got a concussion.  She was in the bathroom and slipped, fell and hit her head on the toilet.  States since then she has had 3-4 additional episodes of falls at home.  Assessment & Plan:   Principal Problem:   Subdural hematoma (HCC) Active Problems:   Depression   Migraine   GERD (gastroesophageal reflux disease)   Protein calorie malnutrition (HCC)   Recurrent falls   UTI (urinary tract infection)   Nasal bone fractures   Knee pain   Normal anion gap metabolic acidosis   Protein-calorie malnutrition, severe   Recurrent Falls , she denies syncope:  Probably secondary to orthostatic hypotension, dehydration from recent diarrhea and polypharmacy ( percocet use and benzodiazepine use).  - EKG shows non specific st t wave abnormalities.  troponins have been negative.  Recommend to get echocardiogram.  Repeat orthostatic vital signs are negative.  PT evaluation recommending CIR. Order placed.  Patient continues to have dizziness and diplopia has improved it has not completely resolved. Echocardiogram is pending   UTI:  Urine cultures ordered.  On MACROBID    Sub dural hematoma:  - repeat CT shows stable hematoma.  Non focal at this time.  Appreciate neuro surgery recommendations.  Holding all anti coagulants.  Neurosurgery recommends to monitor the patient conservatively.  Multiple sub acute nasal bone fractures;  Pain control with Percocet 1-2 tabs every 6 hours as needed   Severe protein calorie malnutrition.  Nutrition consulted.  Recommendations given  Mild non anion gap acidosis  Probably from  dehydration.   GERD  Stable.   Disposition PT evaluation recommending CIR consult, request placed.  DVT prophylaxis:scd's Code Status:full code.  Family Communication: None at bedside.  Disposition Plan: pending clinical improvement.    Consultants:   Neurosurgery.   Procedures: None.  Antimicrobials:none.   Subjective: Continues to have some dizziness on standing up and diplopia since she had the fall.  Any chest pain or shortness of breath no nausea vomiting. Objective: Vitals:   07/03/18 1200 07/03/18 1932 07/03/18 2317 07/04/18 0352  BP: 125/69 96/62 133/78 113/68  Pulse: 67 61 64 72  Resp: 15 18 18 18   Temp: 98.1 F (36.7 C) 98.1 F (36.7 C) 97.6 F (36.4 C) 98.1 F (36.7 C)  TempSrc: Oral Oral Oral Oral  SpO2: 97% 98% 100% 97%  Weight:      Height:       No intake or output data in the 24 hours ending 07/04/18 1134 Filed Weights   07/02/18 1553  Weight: 49 kg    Examination:  General exam: Ill-appearing with facial bruising, not in any kind of distress at this time. Respiratory system: Clear to auscultation bilaterally, no wheezing or rhonchi Cardiovascular system: S1 & S2 heard, RRR. No JVD, murmurs, Gastrointestinal system: Abdomen is , soft, nontender, nondistended with good bowel sounds Central nervous system: Alert and oriented.  Except for diplopia grossly nonfocal Extremities: Some right knee bruising, no pedal edema Skin: No rashes, lesions or ulcers Psychiatry:  Mood & affect appropriate.     Data Reviewed: I have personally reviewed following labs and imaging studies  CBC: Recent Labs  Lab 07/02/18 1601  WBC 5.9  HGB 12.6  HCT 41.6  MCV 87.6  PLT 222   Basic Metabolic Panel: Recent Labs  Lab 07/02/18 1601 07/03/18 0338  NA 136 140  K 3.7 3.6  CL 104 112*  CO2 19* 19*  GLUCOSE 101* 83  BUN 19 18  CREATININE 1.00 0.96  CALCIUM 9.2 8.6*   GFR: Estimated Creatinine Clearance: 44 mL/min (by C-G formula based on SCr of  0.96 mg/dL). Liver Function Tests: Recent Labs  Lab 07/02/18 1903  AST 12*  ALT 9  ALKPHOS 84  BILITOT 0.7  PROT 5.8*  ALBUMIN 3.2*   No results for input(s): LIPASE, AMYLASE in the last 168 hours. No results for input(s): AMMONIA in the last 168 hours. Coagulation Profile: No results for input(s): INR, PROTIME in the last 168 hours. Cardiac Enzymes: Recent Labs  Lab 07/02/18 2126  TROPONINI <0.03   BNP (last 3 results) No results for input(s): PROBNP in the last 8760 hours. HbA1C: No results for input(s): HGBA1C in the last 72 hours. CBG: Recent Labs  Lab 07/02/18 1600  GLUCAP 98   Lipid Profile: No results for input(s): CHOL, HDL, LDLCALC, TRIG, CHOLHDL, LDLDIRECT in the last 72 hours. Thyroid Function Tests: No results for input(s): TSH, T4TOTAL, FREET4, T3FREE, THYROIDAB in the last 72 hours. Anemia Panel: No results for input(s): VITAMINB12, FOLATE, FERRITIN, TIBC, IRON, RETICCTPCT in the last 72 hours. Sepsis Labs: No results for input(s): PROCALCITON, LATICACIDVEN in the last 168 hours.  No results found for this or any previous visit (from the past 240 hour(s)).       Radiology Studies: Ct Head Wo Contrast  Result Date: 07/03/2018 CLINICAL DATA:  67 year old female with subdural hemorrhage follow-up exam. EXAM: CT HEAD WITHOUT CONTRAST TECHNIQUE: Contiguous axial images were obtained from the base of the skull through the vertex without intravenous contrast. COMPARISON:  Head CT dated 07/02/2018 FINDINGS: Brain: The previously seen left subdural hemorrhage appears similar in size to the prior CT although slightly less conspicuous. No new hemorrhage. Mild diffuse cortical atrophy and chronic microvascular ischemic changes. No mass effect or midline shift. Vascular: No hyperdense vessel or unexpected calcification. Skull: Normal. Negative for fracture or focal lesion. Sinuses/Orbits: No acute finding. Other: None IMPRESSION: No significant interval change in  the size of left-sided subdural hemorrhage. No new hemorrhage. Electronically Signed   By: Anner Crete M.D.   On: 07/03/2018 02:54   Ct Head Wo Contrast  Result Date: 07/02/2018 CLINICAL DATA:  Multiple falls up to 3 times a day since the original fall 06/25/2018. Ataxia. EXAM: CT HEAD WITHOUT CONTRAST TECHNIQUE: Contiguous axial images were obtained from the base of the skull through the vertex without intravenous contrast. COMPARISON:  06/27/2018 FINDINGS: Brain: Late acute to subacute left-sided subdural hematoma measuring up to 5 mm in thickness is noted. This is a new finding since prior. No hydrocephalus. Chronic small vessel ischemic disease of periventricular white matter. No midline shift. No effacement basal cisterns. No large vascular territory infarct. Intra-axial mass. Vascular: Atherosclerosis at the skull base. Hyperdense vessel sign. Skull: Bilateral nasal bone fractures are redemonstrated. No acute skull fracture. Sinuses/Orbits: Intact orbits and globes. Bilateral lens replacements. Other: Clear bilateral mastoids without mastoid effusion. IMPRESSION: 1. New since prior exam is a late acute to subacute left sided subdural hematoma measuring up to 5 mm in thickness. No midline shift. These results were called by telephone at the time of interpretation on 07/02/2018 at 6:59 pm to Dr. Judson Roch  WENDEL , who verbally acknowledged these results. 2. Chronic small vessel ischemic disease of periventricular white matter and cerebral atrophy is noted. 3. Bilateral subacute to chronic nasal bone fractures. Electronically Signed   By: Ashley Royalty M.D.   On: 07/02/2018 19:00   Dg Knee Complete 4 Views Left  Result Date: 07/02/2018 CLINICAL DATA:  [place] on 06/25/2018. Persistent knee pain and swelling. EXAM: LEFT KNEE - COMPLETE 4+ VIEW COMPARISON:  04/19/2018 FINDINGS: There is a small knee joint effusion. No evidence of fracture or dislocation. No significant degenerative changes. IMPRESSION: Small  knee joint effusion.  Otherwise negative. Electronically Signed   By: Nelson Chimes M.D.   On: 07/02/2018 18:48        Scheduled Meds: . feeding supplement (ENSURE ENLIVE)  237 mL Oral TID BM  . FLUoxetine  80 mg Oral Daily  . magnesium oxide  400 mg Oral QHS  . multivitamin with minerals  1 tablet Oral Daily  . nitrofurantoin (macrocrystal-monohydrate)  100 mg Oral Q12H  . pantoprazole  40 mg Oral Daily  . topiramate  100 mg Oral QHS  . vitamin B-12  1,000 mcg Oral Daily  . vitamin E  100 Units Oral Daily   Continuous Infusions:   LOS: 0 days    Time spent: 32 minutes.     Hosie Poisson, MD [facility] Pager [phone]   If 7PM-7AM, please contact night-coverage [email or web address] Password [facility] 07/04/2018, 11:34 AM

## 2018-07-04 NOTE — Progress Notes (Signed)
  Echocardiogram 2D Echocardiogram has been performed.  Dorothy White 07/04/2018, 2:43 PM

## 2018-07-04 NOTE — Progress Notes (Signed)
Physical Therapy Treatment Patient Details Name: Dorothy White MRN: [id number] DOB: 06-15-1951 Today's Date: 07/04/2018    History of Present Illness Dorothy White is a 67 y.o. female with medical history significant of anxiety, depression, GERD, hyperlipidemia, L hip hemiarthroplasty, nonischemic cardiomyopathy, CVA presenting to the hospital for evaluation of falls.  Patient states she fell a few days ago and got a concussion.  She was in the bathroom and slipped, fell and hit her head on the toilet.  States since then she has had 3-4 additional episodes of falls at home.  CT showed L SDH.     PT Comments    Patient required assist for balance training but tolerated it well. She alos tolerated her exercises well. She is very motivated to improve. She is a good canidate for CIR.  She was also shown exercises to continue to do while she is in bed.   Follow Up Recommendations  CIR     Equipment Recommendations  None recommended by PT    Recommendations for Other Services Rehab consult;OT consult;Speech consult     Precautions / Restrictions Precautions Precautions: Fall Restrictions Weight Bearing Restrictions: No    Mobility  Bed Mobility Overal bed mobility: Needs Assistance Bed Mobility: Sit to Supine       Sit to supine: Supervision   General bed mobility comments: supervision for safety and wires   Transfers Overall transfer level: Needs assistance Equipment used: None Transfers: Sit to/from Stand Sit to Stand: Min guard         General transfer comment: mion gaurd for initial standing balance   Ambulation/Gait             General Gait Details: no tfocused on today 2nd to patient just walking with OT.    Stairs             Wheelchair Mobility    Modified Rankin (Stroke Patients Only) Modified Rankin (Stroke Patients Only) Pre-Morbid Rankin Score: Slight disability Modified Rankin: Moderately severe disability     Balance  Overall balance assessment: History of Falls;Needs assistance Sitting-balance support: No upper extremity supported Sitting balance-Leahy Scale: Fair     Standing balance support: Single extremity supported Standing balance-Leahy Scale: Fair       Tandem Stance - Right Leg: (Min a for balance ) Tandem Stance - Left Leg: (supervision ) [place]omberg - Eyes Opened: (ssupervision ) Rhomberg - Eyes Closed: (min a )   High Level Balance Comments: worked on toe tocuh of paper towel. Increased assist need with right x15 each; worked on tandem reaching x15 each; box steps needed min a for balance and min cuing to keep coordination             Cognition Arousal/Alertness: Awake/alert Behavior During Therapy: Flat affect Overall Cognitive Status: Impaired/Different from baseline Area of Impairment: Attention;Memory;Following commands;Safety/judgement;Awareness;Problem solving                   Current Attention Level: Sustained Memory: Decreased short-term memory Following Commands: Follows one step commands with increased time;Follows multi-step commands inconsistently;Follows multi-step commands with increased time Safety/Judgement: Decreased awareness of safety Awareness: Emergent Problem Solving: Slow processing;Requires verbal cues General Comments: required cuing for some simple tasks       Exercises General Exercises - Lower Extremity Ankle Circles/Pumps: PROM;20 reps Quad Sets: PROM;15 reps Gluteal Sets: 15 reps Long Arc Quad: 20 reps Hip ABduction/ADduction: 20 reps;Seated(pillow squeeze and manual resistance for abd ) Hip Flexion/Marching: Seated;20 reps    General  Comments General comments (skin integrity, edema, etc.): significant bruising on face. SpO2 dropping to low 80s during functional mobility on RA.       Pertinent Vitals/Pain Pain Assessment: Faces Faces Pain Scale: Hurts a little bit Pain Location: head and face Pain Descriptors / Indicators:  Dull;Sore;Aching Pain Intervention(s): Monitored during session;Limited activity within patient's tolerance    Home Living Family/patient expects to be discharged to:: Private residence Living Arrangements: Non-relatives/Friends Available Help at Discharge: Friend(s);Available PRN/intermittently Type of Home: House Home Access: Level entry   Home Layout: Multi-level Home Equipment: Cane - single point;Bedside commode;Walker - 2 wheels Additional Comments: pt lives with a roommate who is not often home. Has to go upstairs to shower    Prior Function Level of Independence: Independent      Comments: Pt reports she was independent with ADLs, IADLs, and driving. She went through outpt PT for balance because of her falls and reports that it has been very helpful and hasn't fallen in the past month since it ended until this episode. She has no memory of this fall or anything leading up to it. Her roommate found her down in the middle of the night.    PT Goals (current goals can now be found in the care plan section) Acute Rehab PT Goals Patient Stated Goal: "Remember things again" PT Goal Formulation: With patient Time For Goal Achievement: 07/17/18 Potential to Achieve Goals: Good Progress towards PT goals: Progressing toward goals    Frequency    Min 4X/week      PT Plan Current plan remains appropriate    Co-evaluation              AM-PAC PT "6 Clicks" Mobility   Outcome Measure  Help needed turning from your back to your side while in a flat bed without using bedrails?: None Help needed moving from lying on your back to sitting on the side of a flat bed without using bedrails?: A Little Help needed moving to and from a bed to a chair (including a wheelchair)?: A Little Help needed standing up from a chair using your arms (e.g., wheelchair or bedside chair)?: A Little Help needed to walk in hospital room?: A Little Help needed climbing 3-5 steps with a railing? : A  Lot 6 Click Score: 18    End of Session Equipment Utilized During Treatment: Gait belt Activity Tolerance: Patient tolerated treatment well Patient left: in bed;with call bell/phone within reach;with bed alarm set Nurse Communication: Mobility status PT Visit Diagnosis: Unsteadiness on feet (R26.81);Repeated falls (R29.6);Difficulty in walking, not elsewhere classified (R26.2);Pain;Dizziness and giddiness (R42)     Time: 1115-1140 PT Time Calculation (min) (ACUTE ONLY): 25 min  Charges:  $Therapeutic Exercise: 8-22 mins $Neuromuscular Re-education: 8-22 mins                        Carney Living PT DPT  07/04/2018, 11:59 AM

## 2018-07-04 NOTE — Consult Note (Signed)
Physical Medicine and Rehabilitation Consult Reason for Consult:SDH multiple falls Referring Phsyician: AKULA BONETA Dorothy White is an 67 y.o. female.   HPI: Patient with history of anxiety depression GERD hyperlipidemia left hip hemiarthroplasty nonischemic cardiomyopathy and CVA was admitted to [facility] on 07/02/2018 after falling in the bathroom several days prior to admission and hitting her head on the toilet.  Patient had subsequent falls at home, some dental trauma as well but has not seen a dentist.  Patient had positive orthostatic hypotension and patient relates that she had not been eating well prior to admission.  In addition patient had a UDS positive for benzodiazepines.  CT of the head showed a 5 mm left-sided subdural hematoma without midline shift or mass-effect.  No history of anticoagulation.  Patient was treated nonsurgically.  Physical therapy evaluation on 07/03/2016 straight a min assist level with ambulation 125 feet using a straight cane.  There was dizziness and some cognitive deficits also noted with recommendation for speech therapy evaluation.  Occupational therapy evaluation on 07/04/2018 demonstrated min guard assist level transfers min assist for lower body dressing  Review of Systems  Constitutional: Negative for chills and fever.  HENT: Negative for congestion and nosebleeds.        Raccoon eyes Laceration right upper lip  Eyes: Positive for double vision. Negative for pain and redness.  Respiratory: Negative for cough, hemoptysis, sputum production, shortness of breath and stridor.   Cardiovascular: Negative for chest pain, palpitations and leg swelling.  Gastrointestinal: Negative for abdominal pain, constipation, heartburn, nausea and vomiting.  Genitourinary: Negative for dysuria and urgency.  Musculoskeletal: Positive for falls, joint pain and myalgias.  Skin: Negative for itching and rash.  Neurological: Positive for dizziness and loss of consciousness.  Negative for sensory change, speech change and focal weakness.  Endo/Heme/Allergies: Negative for environmental allergies. Does not bruise/bleed easily.  Psychiatric/Behavioral: Positive for memory loss. Negative for hallucinations.    Past Medical History:  Diagnosis Date  . Anemia    ?  Marland Kitchen Anxiety   . Arthritis    "everywhere" (04/25/2016)  . 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 Chronic back pain    "all over" (04/25/2016)  . Constipation   . Depression   . Diverticulitis   . GERD (gastroesophageal reflux disease)   . Headache    "weekly" (04/25/2016)  . 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 HLD (hyperlipidemia)    hx (04/25/2016)  . 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 Mild CAD    a. 25% mLAD, otherwise no sig disease 01/2016 cath.  Marland Kitchen NICM (nonischemic cardiomyopathy) ([place])    a. EF 40-45% by echo 08/3760 at time of complicated migraine/neuro sx, 55-65% at time of cath 01/2016  . Stroke [facility]) 06/2013   "mini" stroke , ?possibly hemaplegic migraine per pt   Past Surgical History:  Procedure Laterality Date  . ANTERIOR APPROACH HEMI HIP ARTHROPLASTY Left 04/01/2017   Procedure: LEFT DIRECT ANTERIOR TOTAL HIP REPLACEMENT;  Surgeon: Leandrew Koyanagi, MD;  Location: [place];  Service: Orthopedics;  Laterality: Left;  LEFT DIRECT ANTERIOR TOTAL HIP REPLACEMENT  . AUGMENTATION MAMMAPLASTY  1980  . BASAL CELL CARCINOMA EXCISION     "tip of my nose"  . BUNIONECTOMY Bilateral 10/2003  . CARDIAC CATHETERIZATION N/A 02/20/2016   Procedure: Left Heart Cath and Coronary Angiography;  Surgeon: Jettie Booze, MD;  Location: [place] CV LAB;  Service: Cardiovascular;  Laterality: N/A;  .  COLONOSCOPY    . DILATION AND CURETTAGE OF UTERUS    . FRACTURE SURGERY    . IR GENERIC HISTORICAL  03/18/2016   IR SINUS/FIST TUBE CHK-NON GI 03/18/2016 Aletta Edouard, MD [facility]-INTERV RAD  . LAPAROSCOPIC SIGMOID COLECTOMY N/A 04/25/2016   Procedure:  LAPAROSCOPIC SIGMOID COLECTOMY;  Surgeon: Stark Klein, MD;  Location: [place];  Service: General;  Laterality: N/A;  . OOPHORECTOMY Bilateral ~ 1999  . ORIF HUMERUS FRACTURE Right 02/15/2015   Procedure: OPEN REDUCTION INTERNAL FIXATION (ORIF) PROXIMAL HUMERUS FRACTURE;  Surgeon: Netta Cedars, MD;  Location: [place];  Service: Orthopedics;  Laterality: Right;  . RHINOPLASTY  1976  . TONSILLECTOMY    . TUBAL LIGATION  ~ 1983  . VAGINAL HYSTERECTOMY  ~ 1998   Family History  Problem Relation Age of Onset  . Lung cancer Mother 66  . Migraines Mother   . Heart attack Father        Vague  . Hypertension Brother   . Esophageal cancer Brother   . Diabetes Paternal Grandmother        questionable  . Colon cancer Neg Hx   . Kidney disease Neg Hx   . Liver disease Neg Hx    Social History:  reports that she has been smoking cigarettes. She has a 34.00 pack-year smoking history. She has never used smokeless tobacco. She reports that she drinks about 1.0 standard drinks of alcohol per week. She reports that she does not use drugs. Allergies:  Allergies  Allergen Reactions  . Opana [Oxymorphone Hcl] Other (See Comments)    hallucinations  . Penicillins Hives    Has patient had a PCN reaction causing immediate rash, facial/tongue/throat swelling, SOB or lightheadedness with hypotension: Unknown Has patient had a PCN reaction causing severe rash involving mucus membranes or skin necrosis: No Has patient had a PCN reaction that required hospitalization No Has patient had a PCN reaction occurring within the last 10 years: No If all of the above answers are "NO", then may proceed with Cephalosporin use.    Medications Prior to Admission  Medication Sig Dispense Refill  . FLUoxetine (PROZAC) 40 MG capsule Take 80 mg by mouth daily.  11  . LUTEIN PO Take 1 capsule by mouth daily.     . Magnesium 500 MG TABS Take 500 mg by mouth at bedtime.    . mometasone (NASONEX) 50 MCG/ACT nasal spray Place 2  sprays into the nose daily. 17 g 0  . omeprazole (PRILOSEC) 40 MG capsule Take 40 mg by mouth daily.  11  . ondansetron (ZOFRAN) 8 MG tablet Take 1 tablet (8 mg total) by mouth every 8 (eight) hours as needed for nausea or vomiting. 30 tablet 0  . oxyCODONE-acetaminophen (PERCOCET) 10-325 MG tablet Take 1 tablet by mouth every 4 (four) hours as needed for pain.   0  . SUMAtriptan (IMITREX) 100 MG tablet Take 50-100 mg by mouth every 2 (two) hours as needed for migraine or headache.   3  . tiZANidine (ZANAFLEX) 4 MG tablet Take 4 mg by mouth 3 (three) times daily as needed for muscle spasms.   0  . topiramate (TOPAMAX) 100 MG tablet Take 1 tablet (100 mg total) by mouth at bedtime. 90 tablet 3  . trazodone (DESYREL) 300 MG tablet Take 1 tablet (300 mg total) by mouth at bedtime as needed for sleep. (Patient taking differently: Take 300 mg by mouth at bedtime. sleep) 30 tablet 0  . vitamin B-12 (CYANOCOBALAMIN) 1000  MCG tablet Take 1 tablet (1,000 mcg total) by mouth daily. 30 tablet 0  . vitamin E 100 UNIT capsule Take 100 Units by mouth daily.  11    Home: Home Living Family/patient expects to be discharged to:: Private residence Living Arrangements: Non-relatives/Friends Available Help at Discharge: Friend(s), Available PRN/intermittently Type of Home: House Home Access: Level entry Home Layout: Multi-level Alternate Level Stairs-Number of Steps: flight Alternate Level Stairs-Rails: Right Bathroom Shower/Tub: Multimedia programmer: Handicapped height Home Equipment: [place] - single point, Bedside commode, Walker - 2 wheels Additional Comments: pt lives with a roommate who is not often home. Has to go upstairs to shower  Functional History: Prior Function Comments: Pt reports she was independent with ADLs, IADLs, and driving. She went through outpt PT for balance because of her falls and reports that it has been very helpful and hasn't fallen in the past month since it ended until  this episode. She has no memory of this fall or anything leading up to it. Her roommate found her down in the middle of the night.  Functional Status:  Mobility:     Ambulation/Gait Gait Distance (Feet): 125 Feet Gait velocity: decreased General Gait Details: pt's cane is too short but she knows this and it was her best friend's who passed away. Min A during ambulation, pt unsteady with turning and with decreased ability to change gait speed. Very short step length and decreased pace.     ADL: ADL Lower Body Dressing Details (indicate cue type and reason): Pt donning shoes and requiring Min A for dynamic standing balance  Cognition: Cognition Overall Cognitive Status: Impaired/Different from baseline Orientation Level: (P) Oriented to person, Oriented to place, Oriented to situation, Disoriented to time Cognition Arousal/Alertness: Awake/alert Behavior During Therapy: Flat affect Overall Cognitive Status: Impaired/Different from baseline Area of Impairment: Attention, Memory, Following commands, Safety/judgement, Awareness, Problem solving Current Attention Level: Sustained Memory: Decreased short-term memory Following Commands: Follows one step commands with increased time, Follows multi-step commands inconsistently, Follows multi-step commands with increased time Safety/Judgement: Decreased awareness of safety Awareness: Emergent Problem Solving: Slow processing, Requires verbal cues General Comments: Decreased ST memory and recalling 2/3 memory words. Pt reporting she is worried because of her memory. Pt not remembering working with PT yesterday. Pt slow to porcess and requires significant amount of time. When locating her room, pt requiring Min A  for use of signs.   Blood pressure 113/68, pulse 72, temperature 98.1 F (36.7 C), temperature source Oral, resp. rate 18, height 5\' 5"  (1.651 m), weight 49 kg, SpO2 97 %. Physical Exam  Nursing note and vitals  reviewed. Constitutional: She is oriented to person, place, and time. She appears well-developed and well-nourished.  HENT:  Head: Normocephalic.  Periorbital ecchymosis Right upper lip laceration  Eyes: Pupils are equal, round, and reactive to light. Conjunctivae and EOM are normal.  Neck: Normal range of motion.  Cardiovascular: Normal rate, regular rhythm and normal heart sounds. Exam reveals no friction rub.  No murmur heard. Respiratory: Effort normal and breath sounds normal. No respiratory distress.  GI: Soft. Bowel sounds are normal. She exhibits no distension. There is no tenderness.  Musculoskeletal: She exhibits tenderness. She exhibits no edema or deformity.  This palpation around the left patella no evidence of joint effusion  Neurological: She is alert and oriented to person, place, and time.  Patient remembers 1 out of 3 objects after 2-minute delay No evidence of dysarthria or aphasia. Motor strength is 5/5 bilateral deltoid bicep  tricep grip hip flexor knee extensor ankle dorsiflexor There is no evidence of dysmetria on finger-nose-finger testing bilaterally.  Skin: Skin is warm and dry.  Psychiatric: She has a normal mood and affect.    No results found for this or any previous visit (from the past 24 hour(s)). Ct Head Wo Contrast  Result Date: 07/03/2018 CLINICAL DATA:  67 year old female with subdural hemorrhage follow-up exam. EXAM: CT HEAD WITHOUT CONTRAST TECHNIQUE: Contiguous axial images were obtained from the base of the skull through the vertex without intravenous contrast. COMPARISON:  Head CT dated 07/02/2018 FINDINGS: Brain: The previously seen left subdural hemorrhage appears similar in size to the prior CT although slightly less conspicuous. No new hemorrhage. Mild diffuse cortical atrophy and chronic microvascular ischemic changes. No mass effect or midline shift. Vascular: No hyperdense vessel or unexpected calcification. Skull: Normal. Negative for  fracture or focal lesion. Sinuses/Orbits: No acute finding. Other: None IMPRESSION: No significant interval change in the size of left-sided subdural hemorrhage. No new hemorrhage. Electronically Signed   By: Anner Crete M.D.   On: 07/03/2018 02:54   Ct Head Wo Contrast  Result Date: 07/02/2018 CLINICAL DATA:  Multiple falls up to 3 times a day since the original fall 06/25/2018. Ataxia. EXAM: CT HEAD WITHOUT CONTRAST TECHNIQUE: Contiguous axial images were obtained from the base of the skull through the vertex without intravenous contrast. COMPARISON:  06/27/2018 FINDINGS: Brain: Late acute to subacute left-sided subdural hematoma measuring up to 5 mm in thickness is noted. This is a new finding since prior. No hydrocephalus. Chronic small vessel ischemic disease of periventricular white matter. No midline shift. No effacement basal cisterns. No large vascular territory infarct. Intra-axial mass. Vascular: Atherosclerosis at the skull base. Hyperdense vessel sign. Skull: Bilateral nasal bone fractures are redemonstrated. No acute skull fracture. Sinuses/Orbits: Intact orbits and globes. Bilateral lens replacements. Other: Clear bilateral mastoids without mastoid effusion. IMPRESSION: 1. New since prior exam is a late acute to subacute left sided subdural hematoma measuring up to 5 mm in thickness. No midline shift. These results were called by telephone at the time of interpretation on 07/02/2018 at 6:59 pm to Dr. Leonia Reader , who verbally acknowledged these results. 2. Chronic small vessel ischemic disease of periventricular white matter and cerebral atrophy is noted. 3. Bilateral subacute to chronic nasal bone fractures. Electronically Signed   By: Ashley Royalty M.D.   On: 07/02/2018 19:00   Dg Knee Complete 4 Views Left  Result Date: 07/02/2018 CLINICAL DATA:  [place] on 06/25/2018. Persistent knee pain and swelling. EXAM: LEFT KNEE - COMPLETE 4+ VIEW COMPARISON:  04/19/2018 FINDINGS: There is a small  knee joint effusion. No evidence of fracture or dislocation. No significant degenerative changes. IMPRESSION: Small knee joint effusion.  Otherwise negative. Electronically Signed   By: Nelson Chimes M.D.   On: 07/02/2018 18:48    Assessment/Plan: Diagnosis: Left subdural hematoma with decreased balance and cognitive deficits with history of multiple falls 1. Does the need for close, 24 hr/day medical supervision in concert with the patient's rehab needs make it unreasonable for this patient to be served in a less intensive setting? Yes 2. Co-Morbidities requiring supervision/potential complications: orthostatic hypotension, benzodiazepine use 3. Due to bladder management, bowel management, safety, skin/wound care, disease management, medication administration, pain management and patient education, does the patient require 24 hr/day rehab nursing? Yes 4. Does the patient require coordinated care of a physician, rehab nurse, PT (1-2 hrs/day, 5 days/week), OT (1-2 hrs/day, 5 days/week) and SLP (.  5-1 hrs/day, 5 days/week) to address physical and functional deficits in the context of the above medical diagnosis(es)? Yes Addressing deficits in the following areas: balance, endurance, locomotion, strength, transferring, bowel/bladder control, bathing, dressing, feeding, grooming, toileting, cognition and psychosocial support 5. Can the patient actively participate in an intensive therapy program of at least 3 hrs of therapy per day at least 5 days per week? Yes 6. The potential for patient to make measurable gains while on inpatient rehab is excellent 7. Anticipated functional outcomes upon discharge from inpatients are mod I PT, Mod I OT, Mod ISLP 8. Estimated rehab length of stay to reach the above functional goals is: 7d 9. Does the patient have adequate social supports to accommodate these discharge functional goals? Yes 10. Anticipated D/C setting: Home 11. Anticipated post D/C treatments: Outpatient  therapy 12. Overall Rehab/Functional Prognosis: excellent  RECOMMENDATIONS: This patient's condition is appropriate for continued rehabilitative care in the following setting: CIR Patient has agreed to participate in recommended program. Yes Note that insurance prior authorization may be required for reimbursement for recommended care.  Comment:   Charlett Blake 07/04/2018

## 2018-07-05 ENCOUNTER — Other Ambulatory Visit: Payer: Self-pay

## 2018-07-05 LAB — URINE CULTURE: Culture: >=100000 — AB

## 2018-07-05 NOTE — Progress Notes (Signed)
Physical Therapy Treatment Patient Details Name: Dorothy White MRN: [id number] DOB: April 22, 1951 Today's Date: 07/05/2018    History of Present Illness Dorothy White is a 67 y.o. female with medical history significant of anxiety, depression, GERD, hyperlipidemia, L hip hemiarthroplasty, nonischemic cardiomyopathy, CVA presenting to the hospital for evaluation of falls.  Patient states she fell a few days ago and got a concussion.  She was in the bathroom and slipped, fell and hit her head on the toilet.  States since then she has had 3-4 additional episodes of falls at home.  CT showed L SDH.     PT Comments    Patient received in bed, feeling much better this afternoon and willing to participate with therapy. Able to complete bed mobility with S, functional transfers with Min guard and no device, and gait approximately 136f with MinA due to drift/listing to R and unsteadiness in general direction of R side, also needed Mod cues for obstacle avoidance and navigation in hallway. Continues to demonstrate need for increased processing time and difficulty following multi-step cues. She had lots of questions abut medical equipment in her room/medical gas shunts on the wall and reoriented patient to room as appropriate. She was left up in the chair with all needs met and chair alarm active this afternoon.    Follow Up Recommendations  CIR     Equipment Recommendations  None recommended by PT    Recommendations for Other Services Rehab consult;OT consult;Speech consult     Precautions / Restrictions Precautions Precautions: Fall Restrictions Weight Bearing Restrictions: No    Mobility  Bed Mobility Overal bed mobility: Needs Assistance Bed Mobility: Supine to Sit     Supine to sit: Supervision     General bed mobility comments: S for safety, no physical assist given   Transfers Overall transfer level: Needs assistance Equipment used: None Transfers: Sit to/from  Stand Sit to Stand: Min guard         General transfer comment: min guard for safety, able to stablize independently today   Ambulation/Gait Ambulation/Gait assistance: Min assist Gait Distance (Feet): 160 Feet Assistive device: Straight cane Gait Pattern/deviations: Step-through pattern;Decreased stride length;Narrow base of support;Drifts right/left Gait velocity: decreased   General Gait Details: able to maintain good seqeuncing with SPC however demonstrates list/drift to R/mild unsteadiness to R requiring intermittent MinA to maintain balance and Mod cues for straight line navigation. Mod cues for navigation in hospital unit, but able to find room when told room # and where room numbers are.    Stairs             Wheelchair Mobility    Modified Rankin (Stroke Patients Only)       Balance Overall balance assessment: History of Falls;Needs assistance Sitting-balance support: No upper extremity supported;Feet supported Sitting balance-Leahy Scale: Good     Standing balance support: Single extremity supported;During functional activity Standing balance-Leahy Scale: Fair Standing balance comment: pt can maintain static stance without support but loses balance with dynamic activity without support, list to R when ambulating with SPC                             Cognition Arousal/Alertness: Awake/alert Behavior During Therapy: Flat affect Overall Cognitive Status: Impaired/Different from baseline Area of Impairment: Attention;Memory;Following commands;Safety/judgement;Awareness;Problem solving                   Current Attention Level: Sustained Memory: Decreased short-term memory Following  Commands: Follows one step commands with increased time;Follows multi-step commands inconsistently;Follows multi-step commands with increased time Safety/Judgement: Decreased awareness of safety Awareness: Emergent Problem Solving: Slow processing;Requires  verbal cues General Comments: required cuing for some simple tasks       Exercises      General Comments        Pertinent Vitals/Pain Pain Assessment: Faces Faces Pain Scale: Hurts a little bit Pain Location: head and face Pain Descriptors / Indicators: Dull;Sore;Aching Pain Intervention(s): Limited activity within patient's tolerance;Monitored during session    Home Living                      Prior Function            PT Goals (current goals can now be found in the care plan section) Acute Rehab PT Goals Patient Stated Goal: "Remember things again" PT Goal Formulation: With patient Time For Goal Achievement: 07/17/18 Potential to Achieve Goals: Good Additional Goals Additional Goal #1: Pt will score >46 on [place] balance with cane Progress towards PT goals: Progressing toward goals    Frequency    Min 4X/week      PT Plan Current plan remains appropriate    Co-evaluation              AM-PAC PT "6 Clicks" Mobility   Outcome Measure  Help needed turning from your back to your side while in a flat bed without using bedrails?: None Help needed moving from lying on your back to sitting on the side of a flat bed without using bedrails?: None Help needed moving to and from a bed to a chair (including a wheelchair)?: A Little Help needed standing up from a chair using your arms (e.g., wheelchair or bedside chair)?: A Little Help needed to walk in hospital room?: A Little Help needed climbing 3-5 steps with a railing? : A Little 6 Click Score: 20    End of Session Equipment Utilized During Treatment: Gait belt Activity Tolerance: Patient tolerated treatment well Patient left: in chair;with chair alarm set;with call bell/phone within reach   PT Visit Diagnosis: Unsteadiness on feet (R26.81);Repeated falls (R29.6);Difficulty in walking, not elsewhere classified (R26.2);Pain;Dizziness and giddiness (R42) Pain - part of body: (head)     Time:  1448-1856 PT Time Calculation (min) (ACUTE ONLY): 20 min  Charges:  $Gait Training: 8-22 mins                     Deniece Ree PT, DPT, CBIS  Supplemental Physical Therapist [place]    Pager [phone] Acute Rehab Office [phone]

## 2018-07-05 NOTE — NC FL2 (Signed)
[place] LEVEL OF CARE SCREENING TOOL     IDENTIFICATION  Patient Name: Dorothy White Birthdate: 05-31-1951 Sex: female Admission Date (Current Location): 07/02/2018  [facility] and [place] Number:  Herbalist and Address:  [facility], [place] [address], [place], [place] [address]      Provider Number: [id number]  Attending Physician Name and Address:  Hosie Poisson, MD  Relative Name and Phone Number:       Current Level of Care: Hospital Recommended Level of Care: [place] Prior Approval Number:    Date Approved/Denied:   PASRR Number: [id number]  Discharge Plan: SNF    Current Diagnoses: Patient Active Problem List   Diagnosis Date Noted  . Protein-calorie malnutrition, severe 07/04/2018  . UTI (urinary tract infection) 07/03/2018  . Nasal bone fractures 07/03/2018  . Knee pain 07/03/2018  . Normal anion gap metabolic acidosis 96/75/9163  . Subdural hematoma ([place]) 07/02/2018  . Recurrent falls 04/16/2017  . 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 At risk for adverse drug event 04/07/2017  . Acute blood loss as cause of postoperative anemia 04/04/2017  . Acute delirium 04/04/2017  . Osteoporosis 04/02/2017  . Closed fracture of neck of left femur ([place]) 03/31/2017  . Scalp contusion 03/31/2017  . Fall at home, initial encounter 03/31/2017  . Hip fracture ([place]) 03/31/2017  . 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 Hx of transient ischemic attack (TIA) 02/19/2017  . Left carotid stenosis- 40-59% 02/19/2017  . Protein calorie malnutrition ([place]) 02/18/2017  . Falls 02/17/2017  . Chest pain 09/26/2016  . Diverticulitis of sigmoid colon 04/25/2016  . Infection due to ESBL-producing Escherichia coli/Diverticular Abscess 03/15/2016  . Diverticulitis 03/12/2016  . Chronic pain syndrome 03/12/2016  . Lesion of pancreas 03/12/2016  . History of chest pain 03/12/2016  . Hypokalemia 03/12/2016   . Angina, class IV ([place]) - chest tightness and pressure with dyspnea with minimal exertion 02/17/2016    Class: Question of  . TIA (transient ischemic attack) 12/26/2015  . DOE (dyspnea on exertion) 12/26/2015  . Right sided weakness 12/17/2015  . Ataxia 12/17/2015  . Shoulder fracture 02/15/2015  . Chronic headache 10/11/2013  . Weakness generalized 08/12/2013  . Dizziness and giddiness 08/12/2013  . Abnormality of gait 07/11/2013  . Hyperlipidemia 05/07/2007  . Migraine 05/07/2007  . PREMATURE VENTRICULAR CONTRACTIONS, FREQUENT 05/07/2007  . GERD (gastroesophageal reflux disease) 05/07/2007  . DIVERTICULOSIS, COLON 05/07/2007  . 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 OSTEOPENIA 05/07/2007  . Depression 03/24/2007  . HEMORRHOIDS 03/24/2007  . ALLERGIC RHINITIS 03/24/2007  . LOW BACK PAIN 03/24/2007  . MIGRAINES, HX OF 03/24/2007    Orientation RESPIRATION BLADDER Height & Weight     Self, Time, Situation, Place  Normal Continent Weight: 108 lb (49 kg) Height:  5\' 5"  (165.1 cm)  BEHAVIORAL SYMPTOMS/MOOD NEUROLOGICAL BOWEL NUTRITION STATUS      Continent Diet(regular)  AMBULATORY STATUS COMMUNICATION OF NEEDS Skin   Limited Assist Verbally Skin abrasions                       Personal Care Assistance Level of Assistance  Bathing, Feeding, Dressing Bathing Assistance: Limited assistance Feeding assistance: Independent Dressing Assistance: Limited assistance     Functional Limitations Info  Sight, Hearing, Speech Sight Info: Adequate Hearing Info: Adequate Speech Info: Adequate    SPECIAL CARE FACTORS FREQUENCY  PT (By licensed PT), OT (By licensed OT)     PT  Frequency: 5x/wk OT Frequency: 5x/wk            Contractures Contractures Info: Not present    Additional Factors Info  Code Status, Allergies, Psychotropic Code Status Info: Full Allergies Info: Opana Oxymorphone Hcl, Penicillins Psychotropic Info: Prozac 80mg  daily         Current  Medications (07/05/2018):  This is the current hospital active medication list Current Facility-Administered Medications  Medication Dose Route Frequency Provider Last Rate Last Dose  . acetaminophen (TYLENOL) tablet 650 mg  650 mg Oral Q6H PRN Shela Leff, MD   650 mg at 07/05/18 1130  . feeding supplement (ENSURE ENLIVE) (ENSURE ENLIVE) liquid 237 mL  237 mL Oral TID BM Hosie Poisson, MD   237 mL at 07/05/18 0930  . FLUoxetine (PROZAC) capsule 80 mg  80 mg Oral Daily Shela Leff, MD   80 mg at 07/04/18 1046  . magnesium oxide (MAG-OX) tablet 400 mg  400 mg Oral QHS Shela Leff, MD   400 mg at 07/04/18 1953  . multivitamin with minerals tablet 1 tablet  1 tablet Oral Daily Hosie Poisson, MD   1 tablet at 07/05/18 0859  . nitrofurantoin (macrocrystal-monohydrate) (MACROBID) capsule 100 mg  100 mg Oral Q12H Shela Leff, MD   100 mg at 07/05/18 0859  . oxyCODONE-acetaminophen (PERCOCET/ROXICET) 5-325 MG per tablet 1-2 tablet  1-2 tablet Oral Q4H PRN Hosie Poisson, MD   2 tablet at 07/05/18 0402  . pantoprazole (PROTONIX) EC tablet 40 mg  40 mg Oral Daily Shela Leff, MD   40 mg at 07/05/18 0859  . topiramate (TOPAMAX) tablet 100 mg  100 mg Oral QHS Shela Leff, MD   100 mg at 07/04/18 1952  . vitamin B-12 (CYANOCOBALAMIN) tablet 1,000 mcg  1,000 mcg Oral Daily Shela Leff, MD   1,000 mcg at 07/05/18 0859  . vitamin E capsule 100 Units  100 Units Oral Daily Shela Leff, MD   100 Units at 07/05/18 5997     Discharge Medications: Please see discharge summary for a list of discharge medications.  Relevant Imaging Results:  Relevant Lab Results:   Additional Information SS#: [id number]  Geralynn Ochs, LCSW

## 2018-07-05 NOTE — Plan of Care (Signed)
  Problem: Education: Goal: Knowledge of General Education information will improve Description Including pain rating scale, medication(s)/side effects and non-pharmacologic comfort measures 07/05/2018 0630 by Georjean Mode, RN Outcome: Progressing 07/04/2018 2334 by Georjean Mode, RN Outcome: Progressing   Problem: Clinical Measurements: Goal: Ability to maintain clinical measurements within normal limits will improve Outcome: Progressing Goal: Will remain free from infection Outcome: Progressing

## 2018-07-05 NOTE — Clinical Social Work Note (Signed)
Clinical Social Work Assessment  Patient Details  Name: Dorothy White MRN: [id number] Date of Birth: 10-25-1950  Date of referral:  07/05/18               Reason for consult:  Facility Placement                Permission sought to share information with:  Facility Art therapist granted to share information::  Yes, Verbal Permission Granted  Name::        Agency::  SNF  Relationship::     Contact Information:     Housing/Transportation Living arrangements for the past 2 months:  Single Family Home Source of Information:  Patient, Medical Team Patient Interpreter Needed:  None Criminal Activity/Legal Involvement Pertinent to Current Situation/Hospitalization:  No - Comment as needed Significant Relationships:  Siblings, Friend Lives with:  Self Do you feel safe going back to the place where you live?  Yes Need for family participation in patient care:  No (Coment)  Care giving concerns:  Patient from home alone, will benefit from short term rehab at discharge.    Social Worker assessment / plan:  CSW met with patient and discussed recommendation for SNF. Patient agreeable, says she's been to [facility] in the past but would like to see all of the options. CSW presented bed offers to patient. Patient would like to review, and will make a decision before tomorrow morning. CSW to follow.  Employment status:  Retired Nurse, adult PT Recommendations:  [place] / Referral to community resources:  [place]  Patient/Family's Response to care:  Patient agreeable to SNF placement.  Patient/Family's Understanding of and Emotional Response to Diagnosis, Current Treatment, and Prognosis:  Patient acknowledges need for rehab, and says that she would like to be able to move around better. Patient would like time to review the available options, and agreed to make a decision before tomorrow  morning.  Emotional Assessment Appearance:  Appears stated age Attitude/Demeanor/Rapport:  Engaged Affect (typically observed):  Pleasant Orientation:  Oriented to Self, Oriented to Place, Oriented to  Time, Oriented to Situation Alcohol / Substance use:  Not Applicable Psych involvement (Current and /or in the community):  No (Comment)  Discharge Needs  Concerns to be addressed:  Care Coordination Readmission within the last 30 days:  No Current discharge risk:  Physical Impairment, Dependent with Mobility, Lives alone Barriers to Discharge:  Continued Medical Work up, [place], [place] 07/05/2018, 3:48 PM

## 2018-07-05 NOTE — Progress Notes (Signed)
Occupational Therapy Treatment Patient Details Name: KRYSTI HICKLING MRN: [id number] DOB: 30-Mar-1951 Today's Date: 07/05/2018    History of present illness CYRIL RAILEY is a 67 y.o. female with medical history significant of anxiety, depression, GERD, hyperlipidemia, L hip hemiarthroplasty, nonischemic cardiomyopathy, CVA presenting to the hospital for evaluation of falls.  Patient states she fell a few days ago and got a concussion.  She was in the bathroom and slipped, fell and hit her head on the toilet.  States since then she has had 3-4 additional episodes of falls at home.  CT showed L SDH.    OT comments  This 67 yo female making progress today with advancing from min A to min guard A for some basic ADLs. She will continue to benefit from acute OT with follow up OT at SNF.  Follow Up Recommendations  SNF;Supervision/Assistance - 24 hour(CIR not appropriate due to no A at home)    Equipment Recommendations  None recommended by OT       Precautions / Restrictions Precautions Precautions: Fall Restrictions Weight Bearing Restrictions: No       Mobility Bed Mobility Overal bed mobility: Independent Bed Mobility: Supine to Sit     Supine to sit: Supervision     General bed mobility comments: S for safety, no physical assist given   Transfers Overall transfer level: Needs assistance Equipment used: None Transfers: Sit to/from Stand Sit to Stand: Min guard         General transfer comment: min guard for safety, able to stablize independently today     Balance Overall balance assessment: History of Falls;Needs assistance Sitting-balance support: No upper extremity supported;Feet supported Sitting balance-Leahy Scale: Good     Standing balance support: No upper extremity supported;During functional activity Standing balance-Leahy Scale: Fair Standing balance comment: pt can maintain static stance without support but loses balance with dynamic activity  without support, list to R when ambulating with SPC                            ADL either performed or assessed with clinical judgement   ADL Overall ADL's : Needs assistance/impaired                     Lower Body Dressing: Min guard;Sit to/from stand   Toilet Transfer: Min guard;Ambulation;Regular Toilet;Grab bars                   Vision Baseline Vision/History: Wears glasses Wears Glasses: Reading only(does not have them here at hospital) Patient Visual Report: Diplopia Vision Assessment?: Yes Eye Alignment: Within Functional Limits Ocular Range of Motion: Within Functional Limits Alignment/Gaze Preference: Within Defined Limits Tracking/Visual Pursuits: Able to track stimulus in all quads without difficulty Convergence: Within functional limits Visual Fields: No apparent deficits Diplopia Assessment: Disappears with one eye closed;Objects split side to side;Present in far gaze(intermittently) Additional Comments: She looked at the chair to her right and stated she saw 2 side-by-side, then looked back at me. When I had her look at chair on her right again she saw only 1          Cognition Arousal/Alertness: Awake/alert Behavior During Therapy: Flat affect Overall Cognitive Status: Impaired/Different from baseline Area of Impairment: Following commands                   Following Commands: Follows multi-step commands inconsistently  Pertinent Vitals/ Pain       Pain Assessment: 0-10 Faces Pain Scale: Hurts worst Pain Location: head Pain Descriptors / Indicators: Aching Pain Intervention(s): Patient requesting pain meds-RN notified;RN gave pain meds during session         Frequency  Min 2X/week        Progress Toward Goals  OT Goals(current goals can now be found in the care plan section)  Progress towards OT goals: Progressing toward goals  Acute Rehab OT Goals Patient Stated Goal: "Remember things  again" ADL Goals Pt Will Perform Grooming: with modified independence;standing Pt Will Perform Lower Body Dressing: with modified independence;sit to/from stand Pt Will Transfer to Toilet: with modified independence;ambulating;regular height toilet Additional ADL Goal #1: Pt will perform three part trail making task with 2-3 cues  Plan Discharge plan needs to be updated       AM-PAC OT "6 Clicks" Daily Activity     Outcome Measure   Help from another person eating meals?: None Help from another person taking care of personal grooming?: A Little Help from another person toileting, which includes using toliet, bedpan, or urinal?: A Little Help from another person bathing (including washing, rinsing, drying)?: A Little Help from another person to put on and taking off regular upper body clothing?: A Little Help from another person to put on and taking off regular lower body clothing?: A Little 6 Click Score: 19    End of Session    OT Visit Diagnosis: Unsteadiness on feet (R26.81);Other abnormalities of gait and mobility (R26.89);Muscle weakness (generalized) (M62.81);Other symptoms and signs involving cognitive function;Pain Pain - part of body: (front of head)   Activity Tolerance Patient tolerated treatment well   Patient Left in bed;with call bell/phone within reach;with bed alarm set   Nurse Communication Patient requests pain meds        Time: 5003-7048 OT Time Calculation (min): 17 min  Charges: OT General Charges $OT Visit: 1 Visit OT Treatments $Self Care/Home Management : 8-22 mins  [place], OTR/L Acute [employer] Pager [phone] Office [phone]      Almon Register 07/05/2018, 5:18 PM

## 2018-07-05 NOTE — Progress Notes (Signed)
Patient on call light every 30-60 min with requests for pain medicine. States she has a HA.Was found ambulating to nurse station without assist, escorted back to room. Patient gets up on her own, continually reorienting and educating to not get up on her own.Bed alarm active.

## 2018-07-05 NOTE — Care Management Note (Addendum)
Case Management Note  Patient Details  Name: Dorothy White MRN: [id number] Date of Birth: [birth year]  Subjective/Objective:   Pt admitted with SDH after a fall at home. Per patient and friend Selinda Flavin: [phone]), she has had multiple falls at home. Pt states she doesn't have issues getting her meds at home. Pts friend is concerned d/t her abusing her meds at home. He states she takes a lot of narcotic meds at home and becomes weak and falls.  Pt does not have 24 hour supervision at home. Her brother is her primary contact but he is suffering from cancer and can not assist her any. Her friend helps some but is not able to provide 24 hour supervision and plans on being out of town in the next few days.  Pt states she does have issues at times with transportation.      Friend interested in pt having HH RN when she gets home and potentially having meds delivered in blister packs instead of bottles to help her stay on track with her meds.              Action/Plan: Recommendations are for CIR. Pt does not have the needed support after a CIR stay. CM met with the patient and she is willing to have a rehab SNF stay prior to d/cing home. Pts friend is in agreement with this plan.  CSW updated. CM following.  Expected Discharge Date:                  Expected Discharge Plan:  Skilled Nursing Facility  In-House Referral:  Clinical Social Work  Discharge planning Services  CM Consult  Post Acute Care Choice:    Choice offered to:     DME Arranged:    DME Agency:     HH Arranged:    [place] Agency:     Status of Service:  In process, will continue to follow  If discussed at Long Length of Stay Meetings, dates discussed:    Additional Comments:  Pollie Friar, RN 07/05/2018, 11:53 AM

## 2018-07-05 NOTE — Progress Notes (Signed)
PT Cancellation Note  Patient Details Name: Dorothy White MRN: [id number] DOB: 09-16-1950   Cancelled Treatment:    Reason Eval/Treat Not Completed: Other (comment) patient with severe headache 10/10, has received medication recently but it has not taken effect yet. Requests that PT return in afternoon when pain will hopefully be better. Will attempt to return if time/schedule allow, otherwise will attempt to see on next day of service.    Deniece Ree PT, DPT, CBIS  Supplemental Physical Therapist [facility]    Pager [phone] Acute Rehab Office [phone]

## 2018-07-05 NOTE — Progress Notes (Addendum)
IP rehab admissions - Noted patient standing in hallway by her room on rounds.  She was confused and did not understand about her injury.  I called her brother.  Patient lives in the downstairs part of a friend's home.  The friend is not available for supervision and the brother is not available due to medical issues.  Patient was recently at [facility] for a month and did well.  Brother says then patient takes too much medication at home and has falls and other issues.  Patient is doing well with mobility.  Based on lack of caregiver support and current confusion, feel that patient needs SNF placement.  Her brother is in agreement.  I will sign off for inpatient rehab at this time.  Call me for questions.  [phone]

## 2018-07-05 NOTE — Progress Notes (Signed)
PROGRESS NOTE    Dorothy White  YKD:[id number] DOB: [birth year] DOA: 07/02/2018 PCP: Maurice Small, MD    Brief Narrative:  Dorothy White is a 67 y.o. female with medical history significant of anxiety, depression, GERD, hyperlipidemia, nonischemic cardiomyopathy, CVA presenting to the hospital for evaluation of falls.  Patient states she fell a few days ago and got a concussion.  She was in the bathroom and slipped, fell and hit her head on the toilet.  States since then she has had 3-4 additional episodes of falls at home.  Patient was found to have subdural hematoma.  She was seen by neuro neurosurgery and recommended conservative management at this time. No further intervention. Patient continues to have headache and dizziness on walking and gets confused. Assessment & Plan:   Principal Problem:   Subdural hematoma (HCC) Active Problems:   Depression   Migraine   GERD (gastroesophageal reflux disease)   Protein calorie malnutrition (HCC)   Recurrent falls   UTI (urinary tract infection)   Nasal bone fractures   Knee pain   Normal anion gap metabolic acidosis   Protein-calorie malnutrition, severe   Recurrent Falls , she denies syncope:  Probably secondary to orthostatic hypotension, dehydration from recent diarrhea and polypharmacy ( percocet use and benzodiazepine use).  - EKG shows non specific st t wave abnormalities.  troponins have been negative.  Recommend to get echocardiogram.  Repeat orthostatic vital signs are negative.  PT evaluation recommending CIR. Order placed.  Unfortunately patient is not a candidate for CIR and we are planning for SNF on discharge Patient reports her diplopia has resolved but continues to have dizziness and headache at this time.  Will recommend to repeat CT of the head without contrast to see if the subdural hematoma has worsened.   UTI:  Urine cultures ordered.  On MACROBID    Sub dural hematoma:  Patient reports worsening  headache today and persistent dizziness but her diplopia appears to have resolved.  Will repeat CT head without contrast to see if the subdural hematoma has increased in size. Appreciate neuro surgery recommendations.  Holding all anti coagulants.  Neurosurgery recommends to monitor the patient conservatively.  Multiple sub acute nasal bone fractures;  Pain control with Percocet 1-2 tabs every 6 hours as needed.  Please do not increase her narcotic pain medication or add any new narcotic pain medications.   Severe protein calorie malnutrition.  Nutrition consulted.  Recommendations given  Mild non anion gap acidosis  Probably from dehydration.   GERD  Stable.   Disposition PT evaluation recommending CIR, unfortunately patient is not a candidate for CIR due to lack of support.  Will plan for SNF in 1 to 2 days.  DVT prophylaxis:scd's Code Status:full code.  Family Communication: None at bedside.  Disposition Plan: pending clinical improvement.    Consultants:   Neurosurgery.   Procedures: None.  Antimicrobials:none.   Subjective: No nausea or vomiting but persistent headache and dizziness.  Diplopia appears to have resolved. Objective: Vitals:   07/04/18 2347 07/05/18 0356 07/05/18 0730 07/05/18 1231  BP: (!) 153/70 122/60 128/65 (!) 154/86  Pulse: 66 70 67 75  Resp: 20 18 18 18   Temp: 98.5 F (36.9 C) 98 F (36.7 C)  98.6 F (37 C)  TempSrc: Oral Oral  Oral  SpO2: 96% 97% 95% 98%  Weight:      Height:       No intake or output data in the 24 hours ending 07/05/18 1313  Filed Weights   07/02/18 1553  Weight: 49 kg    Examination:  General exam: Ill-appearing with facial bruising, not in any kind of distress at this time. Respiratory system: Clear to auscultation bilaterally, no wheezing or rhonchi Cardiovascular system: S1 & S2 heard, RRR. No JVD, murmurs, Gastrointestinal system: Abdomen is , soft, nontender, nondistended with good bowel sounds Central  nervous system: Alert and oriented to person and place only Extremities: Some right knee bruising, no pedal edema Skin: No rashes, lesions or ulcers Psychiatry:  Mood & affect appropriate.     Data Reviewed: I have personally reviewed following labs and imaging studies  CBC: Recent Labs  Lab 07/02/18 1601  WBC 5.9  HGB 12.6  HCT 41.6  MCV 87.6  PLT 732   Basic Metabolic Panel: Recent Labs  Lab 07/02/18 1601 07/03/18 0338  NA 136 140  K 3.7 3.6  CL 104 112*  CO2 19* 19*  GLUCOSE 101* 83  BUN 19 18  CREATININE 1.00 0.96  CALCIUM 9.2 8.6*   GFR: Estimated Creatinine Clearance: 44 mL/min (by C-G formula based on SCr of 0.96 mg/dL). Liver Function Tests: Recent Labs  Lab 07/02/18 1903  AST 12*  ALT 9  ALKPHOS 84  BILITOT 0.7  PROT 5.8*  ALBUMIN 3.2*   No results for input(s): LIPASE, AMYLASE in the last 168 hours. No results for input(s): AMMONIA in the last 168 hours. Coagulation Profile: No results for input(s): INR, PROTIME in the last 168 hours. Cardiac Enzymes: Recent Labs  Lab 07/02/18 2126  TROPONINI <0.03   BNP (last 3 results) No results for input(s): PROBNP in the last 8760 hours. HbA1C: No results for input(s): HGBA1C in the last 72 hours. CBG: Recent Labs  Lab 07/02/18 1600  GLUCAP 98   Lipid Profile: No results for input(s): CHOL, HDL, LDLCALC, TRIG, CHOLHDL, LDLDIRECT in the last 72 hours. Thyroid Function Tests: No results for input(s): TSH, T4TOTAL, FREET4, T3FREE, THYROIDAB in the last 72 hours. Anemia Panel: No results for input(s): VITAMINB12, FOLATE, FERRITIN, TIBC, IRON, RETICCTPCT in the last 72 hours. Sepsis Labs: No results for input(s): PROCALCITON, LATICACIDVEN in the last 168 hours.  Recent Results (from the past 240 hour(s))  Culture, Urine     Status: Abnormal   Collection Time: 07/03/18  1:33 AM  Result Value Ref Range Status   Specimen Description URINE, RANDOM  Final   Special Requests   Final     NONE Performed at [facility], [address] [place], [place] [address]    Culture >=100,000 COLONIES/mL ESCHERICHIA COLI (A)  Final   Report Status 07/05/2018 FINAL  Final   Organism ID, Bacteria ESCHERICHIA COLI (A)  Final      Susceptibility   Escherichia coli - MIC*    AMPICILLIN <=2 SENSITIVE Sensitive     CEFAZOLIN <=4 SENSITIVE Sensitive     CEFTRIAXONE <=1 SENSITIVE Sensitive     CIPROFLOXACIN <=0.25 SENSITIVE Sensitive     GENTAMICIN <=1 SENSITIVE Sensitive     IMIPENEM <=0.25 SENSITIVE Sensitive     NITROFURANTOIN <=16 SENSITIVE Sensitive     TRIMETH/SULFA <=20 SENSITIVE Sensitive     AMPICILLIN/SULBACTAM <=2 SENSITIVE Sensitive     PIP/TAZO <=4 SENSITIVE Sensitive     Extended ESBL NEGATIVE Sensitive     * >=100,000 COLONIES/mL ESCHERICHIA COLI         Radiology Studies: No results found.      Scheduled Meds: . feeding supplement (ENSURE ENLIVE)  237 mL  Oral TID BM  . FLUoxetine  80 mg Oral Daily  . magnesium oxide  400 mg Oral QHS  . multivitamin with minerals  1 tablet Oral Daily  . nitrofurantoin (macrocrystal-monohydrate)  100 mg Oral Q12H  . pantoprazole  40 mg Oral Daily  . topiramate  100 mg Oral QHS  . vitamin B-12  1,000 mcg Oral Daily  . vitamin E  100 Units Oral Daily   Continuous Infusions:   LOS: 1 day    Time spent: 32 minutes.     Hosie Poisson, MD [facility] Pager [phone]   If 7PM-7AM, please contact night-coverage [email or web address] Password [email or web address] 07/05/2018, 1:13 PM

## 2018-07-06 MED ORDER — OXYCODONE-ACETAMINOPHEN 5-325 MG PO TABS: 1.0000 | ORAL_TABLET | ORAL | 0 refills | Status: DC | PRN

## 2018-07-06 MED ORDER — ENSURE ENLIVE PO LIQD: 237.0000 mL | Freq: Three times a day (TID) | ORAL | 12 refills | Status: DC

## 2018-07-06 MED ORDER — ADULT MULTIVITAMIN W/MINERALS CH: 1.0000 | ORAL_TABLET | Freq: Every day | ORAL | 0 refills | Status: DC

## 2018-07-06 MED ORDER — NITROFURANTOIN MONOHYD MACRO 100 MG PO CAPS: 100.0000 mg | ORAL_CAPSULE | Freq: Two times a day (BID) | ORAL | 0 refills | Status: DC

## 2018-07-06 NOTE — Progress Notes (Signed)
CSW met with patient and brother at bedside to discuss SNF bed offers. Patient's brother said that he should be the one communicated with, as patient has been confused, and patient agreed. Patient's brother discussed bed offers and has chosen [facility]. CSW contacted [facility] to confirm bed availability and request them to initiate authorization request.  CSW to follow.  Laveda Abbe, [place] Clinical Social Worker [phone]

## 2018-07-06 NOTE — Progress Notes (Signed)
CSW following for discharge plan. Patient still has not received insurance authorization to admit to SNF.   CSW to continue to follow.  Laveda Abbe, [place] Clinical Social Worker [phone]

## 2018-07-06 NOTE — Discharge Summary (Addendum)
Physician Discharge Summary  [name] WER:[id number] DOB: [birth year] DOA: 07/02/2018   Original note written by Hosie Poisson, MD. Updates include discharge date and antibiotic duration. Patient continues to be stable for discharge.   -Cordelia Poche, MD  PCP: Maurice Small, MD  Admit date: 07/02/2018 Discharge date: 07/07/2018  Admitted From: Home.  Disposition:  SNF   Recommendations for Outpatient Follow-up:  1. Follow up with PCP in 1-2 weeks 2. Please obtain BMP/CBC in one week Please follow up neurology for early onset dementia. Please follow-up with neurosurgery if symptoms recur.  Discharge Condition:stable.  CODE STATUS: full code.  Diet recommendation: Heart Healthy    Brief/Interim Summary: Dorothy Flegal Hunteris a 67 y.o.femalewith medical history significant ofanxiety, depression, GERD, hyperlipidemia, nonischemic cardiomyopathy, CVA presenting to the hospital for evaluation of falls.Patient states she fell a few days ago and got a concussion. She was in the bathroom and slipped,fell and hit her head on the toilet. States since then she has had 3-4 additional episodes of falls at home.  Patient was found to have subdural hematoma.  She was seen by neuro neurosurgery and recommended conservative management at this time. No further intervention. Patient continues to have some dizziness but headache has resolved and diplopia has improved.  Discharge Diagnoses:  Principal Problem:   Subdural hematoma (HCC) Active Problems:   Depression   Migraine   GERD (gastroesophageal reflux disease)   Protein calorie malnutrition (HCC)   Recurrent falls   UTI (urinary tract infection)   Nasal bone fractures   Knee pain   Normal anion gap metabolic acidosis   Protein-calorie malnutrition, severe  Recurrent Falls , she denies syncope:  Probably secondary to orthostatic hypotension, dehydration from recent diarrhea and polypharmacy ( percocet use and benzodiazepine  use).  - EKG shows non specific st t wave abnormalities.  troponins have been negative.  Recommend to get echocardiogram.  Repeat orthostatic vital signs are negative.  PT evaluation recommending CIR. Order placed.  Unfortunately patient is not a candidate for CIR and we are planning for SNF on discharge   Acute encephalopathy possibly from subdural hematoma and early onset dementia from the MRI findings.  She will need outpatient referral to neurology for work-up for early onset dementia.   UTI:  Urine cultures growing E. coli On MACROBID    Sub dural hematoma:  Appreciate neuro surgery recommendations.  Holding all anti coagulants.  Neurosurgery recommends to monitor the patient conservatively.  Multiple sub acute nasal bone fractures;  Pain control with Percocet 1-2 tabs every 6 hours as needed.  Please do not increase her narcotic pain medication or add any new narcotic pain medications.   Severe protein calorie malnutrition.  Nutrition consulted.  Recommendations given  Mild non anion gap acidosis  Probably from dehydration.  Improved  GERD  Stable.   Disposition PT evaluation recommending CIR, unfortunately patient is not a candidate for CIR due to lack of support.  Discharge Instructions  Discharge Instructions    Diet - low sodium heart healthy   Complete by:  As directed    Discharge instructions   Complete by:  As directed    Will need outpatient follow up with Neurology ,  Please follow up with neuro surgery if symptoms re occur.  Please follow up with PCp in one week.     Allergies as of 07/07/2018      Reactions   Opana [oxymorphone Hcl] Other (See Comments)   hallucinations   Penicillins Hives   Has  patient had a PCN reaction causing immediate rash, facial/tongue/throat swelling, SOB or lightheadedness with hypotension: Unknown Has patient had a PCN reaction causing severe rash involving mucus membranes or skin necrosis: No Has patient had  a PCN reaction that required hospitalization No Has patient had a PCN reaction occurring within the last 10 years: No If all of the above answers are "NO", then may proceed with Cephalosporin use.      Medication List    STOP taking these medications   oxyCODONE-acetaminophen 10-325 MG tablet Commonly known as:  PERCOCET Replaced by:  oxyCODONE-acetaminophen 5-325 MG tablet     TAKE these medications   feeding supplement (ENSURE ENLIVE) Liqd Take 237 mLs by mouth 3 (three) times daily between meals.   FLUoxetine 40 MG capsule Commonly known as:  PROZAC Take 80 mg by mouth daily.   LUTEIN PO Take 1 capsule by mouth daily.   Magnesium 500 MG Tabs Take 500 mg by mouth at bedtime.   mometasone 50 MCG/ACT nasal spray Commonly known as:  NASONEX Place 2 sprays into the nose daily.   multivitamin with minerals Tabs tablet Take 1 tablet by mouth daily.   nitrofurantoin (macrocrystal-monohydrate) 100 MG capsule Commonly known as:  MACROBID Take 1 capsule (100 mg total) by mouth every 12 (twelve) hours for 2 days.   omeprazole 40 MG capsule Commonly known as:  PRILOSEC Take 40 mg by mouth daily.   ondansetron 8 MG tablet Commonly known as:  ZOFRAN Take 1 tablet (8 mg total) by mouth every 8 (eight) hours as needed for nausea or vomiting.   oxyCODONE-acetaminophen 5-325 MG tablet Commonly known as:  PERCOCET/ROXICET Take 1-2 tablets by mouth every 4 (four) hours as needed for moderate pain or severe pain. Replaces:  oxyCODONE-acetaminophen 10-325 MG tablet   SUMAtriptan 100 MG tablet Commonly known as:  IMITREX Take 50-100 mg by mouth every 2 (two) hours as needed for migraine or headache.   tiZANidine 4 MG tablet Commonly known as:  ZANAFLEX Take 4 mg by mouth 3 (three) times daily as needed for muscle spasms.   topiramate 100 MG tablet Commonly known as:  TOPAMAX Take 1 tablet (100 mg total) by mouth at bedtime.   trazodone 300 MG tablet Commonly known as:   DESYREL Take 1 tablet (300 mg total) by mouth at bedtime as needed for sleep. What changed:    when to take this  additional instructions   vitamin B-12 1000 MCG tablet Commonly known as:  CYANOCOBALAMIN Take 1 tablet (1,000 mcg total) by mouth daily.   vitamin E 100 UNIT capsule Take 100 Units by mouth daily.       Contact information for follow-up providers    Maurice Small, MD. Schedule an appointment as soon as possible for a visit in 1 week(s).   Specialty:  Family Medicine Contact information: [place] [address] [place] [address] [phone]        [facility]. Schedule an appointment as soon as possible for a visit in 2 week(s).   Specialty:  Neurology Why:  for early onset dementia  Contact information: [address] [place] [phone]           Contact information for after-discharge care    [facility] .   Service:  Skilled Nursing Contact information: [address] [place] [phone]  Allergies  Allergen Reactions  . Opana [Oxymorphone Hcl] Other (See Comments)    hallucinations  . Penicillins Hives    Has patient had a PCN reaction causing immediate rash, facial/tongue/throat swelling, SOB or lightheadedness with hypotension: Unknown Has patient had a PCN reaction causing severe rash involving mucus membranes or skin necrosis: No Has patient had a PCN reaction that required hospitalization No Has patient had a PCN reaction occurring within the last 10 years: No If all of the above answers are "NO", then may proceed with Cephalosporin use.     Consultations:  Neuro surgery.    Procedures/Studies: Ct Head Wo Contrast  Result Date: 07/03/2018 CLINICAL DATA:  67 year old female with subdural hemorrhage follow-up exam. EXAM: CT HEAD WITHOUT CONTRAST TECHNIQUE: Contiguous  axial images were obtained from the base of the skull through the vertex without intravenous contrast. COMPARISON:  Head CT dated 07/02/2018 FINDINGS: Brain: The previously seen left subdural hemorrhage appears similar in size to the prior CT although slightly less conspicuous. No new hemorrhage. Mild diffuse cortical atrophy and chronic microvascular ischemic changes. No mass effect or midline shift. Vascular: No hyperdense vessel or unexpected calcification. Skull: Normal. Negative for fracture or focal lesion. Sinuses/Orbits: No acute finding. Other: None IMPRESSION: No significant interval change in the size of left-sided subdural hemorrhage. No new hemorrhage. Electronically Signed   By: Anner Crete M.D.   On: 07/03/2018 02:54   Ct Head Wo Contrast  Result Date: 07/02/2018 CLINICAL DATA:  Multiple falls up to 3 times a day since the original fall 06/25/2018. Ataxia. EXAM: CT HEAD WITHOUT CONTRAST TECHNIQUE: Contiguous axial images were obtained from the base of the skull through the vertex without intravenous contrast. COMPARISON:  06/27/2018 FINDINGS: Brain: Late acute to subacute left-sided subdural hematoma measuring up to 5 mm in thickness is noted. This is a new finding since prior. No hydrocephalus. Chronic small vessel ischemic disease of periventricular white matter. No midline shift. No effacement basal cisterns. No large vascular territory infarct. Intra-axial mass. Vascular: Atherosclerosis at the skull base. Hyperdense vessel sign. Skull: Bilateral nasal bone fractures are redemonstrated. No acute skull fracture. Sinuses/Orbits: Intact orbits and globes. Bilateral lens replacements. Other: Clear bilateral mastoids without mastoid effusion. IMPRESSION: 1. New since prior exam is a late acute to subacute left sided subdural hematoma measuring up to 5 mm in thickness. No midline shift. These results were called by telephone at the time of interpretation on 07/02/2018 at 6:59 pm to Dr. Leonia Reader , who verbally acknowledged these results. 2. Chronic small vessel ischemic disease of periventricular white matter and cerebral atrophy is noted. 3. Bilateral subacute to chronic nasal bone fractures. Electronically Signed   By: Ashley Royalty M.D.   On: 07/02/2018 19:00   Ct Head Wo Contrast  Result Date: 06/27/2018 CLINICAL DATA:  Status post fall. Patient complains of double vision. Bruising of the face. EXAM: CT HEAD WITHOUT CONTRAST CT MAXILLOFACIAL WITHOUT CONTRAST TECHNIQUE: Multidetector CT imaging of the head and maxillofacial structures were performed using the standard protocol without intravenous contrast. Multiplanar CT image reconstructions of the maxillofacial structures were also generated. COMPARISON:  None. FINDINGS: CT HEAD FINDINGS Brain: No evidence of acute infarction, hemorrhage, hydrocephalus, extra-axial collection or mass lesion/mass effect. Brain parenchymal volume loss. Vascular: Calcific atherosclerotic disease of the intra cavernous carotid arteries. Skull: Normal. Negative for fracture or focal lesion. Other: None. CT MAXILLOFACIAL FINDINGS Osseous: Minimally displaced bilateral nasal fractures. The nasal septum is intact. Poor dentition with several broken, missing teeth and  large cavities. Orbits: Negative. No traumatic or inflammatory finding. Sinuses: Mild mucosal thickening of the ethmoid sinuses, otherwise clear. Soft tissues: Right buccal hematoma. IMPRESSION: No acute intracranial abnormality. Brain parenchymal volume loss. Minimally displaced bilateral nasal fractures with right buccal hematoma. Electronically Signed   By: Fidela Salisbury M.D.   On: 06/27/2018 17:07   Dg Knee Complete 4 Views Left  Result Date: 07/02/2018 CLINICAL DATA:  [place] on 06/25/2018. Persistent knee pain and swelling. EXAM: LEFT KNEE - COMPLETE 4+ VIEW COMPARISON:  04/19/2018 FINDINGS: There is a small knee joint effusion. No evidence of fracture or dislocation. No significant  degenerative changes. IMPRESSION: Small knee joint effusion.  Otherwise negative. Electronically Signed   By: Nelson Chimes M.D.   On: 07/02/2018 18:48   Ct Maxillofacial Wo Contrast  Result Date: 06/27/2018 CLINICAL DATA:  Status post fall. Patient complains of double vision. Bruising of the face. EXAM: CT HEAD WITHOUT CONTRAST CT MAXILLOFACIAL WITHOUT CONTRAST TECHNIQUE: Multidetector CT imaging of the head and maxillofacial structures were performed using the standard protocol without intravenous contrast. Multiplanar CT image reconstructions of the maxillofacial structures were also generated. COMPARISON:  None. FINDINGS: CT HEAD FINDINGS Brain: No evidence of acute infarction, hemorrhage, hydrocephalus, extra-axial collection or mass lesion/mass effect. Brain parenchymal volume loss. Vascular: Calcific atherosclerotic disease of the intra cavernous carotid arteries. Skull: Normal. Negative for fracture or focal lesion. Other: None. CT MAXILLOFACIAL FINDINGS Osseous: Minimally displaced bilateral nasal fractures. The nasal septum is intact. Poor dentition with several broken, missing teeth and large cavities. Orbits: Negative. No traumatic or inflammatory finding. Sinuses: Mild mucosal thickening of the ethmoid sinuses, otherwise clear. Soft tissues: Right buccal hematoma. IMPRESSION: No acute intracranial abnormality. Brain parenchymal volume loss. Minimally displaced bilateral nasal fractures with right buccal hematoma. Electronically Signed   By: Fidela Salisbury M.D.   On: 06/27/2018 17:07      Subjective: No chest pain or sob.   Discharge Exam: Vitals:   07/07/18 0733 07/07/18 1131  BP: 123/65 120/69  Pulse: 73 65  Resp: 16 18  Temp: 98.8 F (37.1 C) 98.9 F (37.2 C)  SpO2: 96% 96%   Vitals:   07/06/18 2334 07/07/18 0334 07/07/18 0733 07/07/18 1131  BP: (!) 142/72 130/75 123/65 120/69  Pulse: 64 62 73 65  Resp: 16 18 16 18   Temp: 98 F (36.7 C) 98.1 F (36.7 C) 98.8 F (37.1  C) 98.9 F (37.2 C)  TempSrc: Oral Oral Oral Oral  SpO2: 97% 94% 96% 96%  Weight:      Height:        General: Pt is alert, awake, not in acute distress Cardiovascular: RRR, S1/S2 +, no rubs, no gallops Respiratory: CTA bilaterally, no wheezing, no rhonchi Abdominal: Soft, NT, ND, bowel sounds + Extremities: no edema, no cyanosis    The results of significant diagnostics from this hospitalization (including imaging, microbiology, ancillary and laboratory) are listed below for reference.     Microbiology: Recent Results (from the past 240 hour(s))  Culture, Urine     Status: Abnormal   Collection Time: 07/03/18  1:33 AM  Result Value Ref Range Status   Specimen Description URINE, RANDOM  Final   Special Requests   Final    NONE Performed at [facility], [address] [place], [place] [address]    Culture >=100,000 COLONIES/mL ESCHERICHIA COLI (A)  Final   Report Status 07/05/2018 FINAL  Final   Organism ID, Bacteria ESCHERICHIA COLI (A)  Final  Susceptibility   Escherichia coli - MIC*    AMPICILLIN <=2 SENSITIVE Sensitive     CEFAZOLIN <=4 SENSITIVE Sensitive     CEFTRIAXONE <=1 SENSITIVE Sensitive     CIPROFLOXACIN <=0.25 SENSITIVE Sensitive     GENTAMICIN <=1 SENSITIVE Sensitive     IMIPENEM <=0.25 SENSITIVE Sensitive     NITROFURANTOIN <=16 SENSITIVE Sensitive     TRIMETH/SULFA <=20 SENSITIVE Sensitive     AMPICILLIN/SULBACTAM <=2 SENSITIVE Sensitive     PIP/TAZO <=4 SENSITIVE Sensitive     Extended ESBL NEGATIVE Sensitive     * >=100,000 COLONIES/mL ESCHERICHIA COLI     Labs: BNP (last 3 results) No results for input(s): BNP in the last 8760 hours. Basic Metabolic Panel: Recent Labs  Lab 07/02/18 1601 07/03/18 0338  NA 136 140  K 3.7 3.6  CL 104 112*  CO2 19* 19*  GLUCOSE 101* 83  BUN 19 18  CREATININE 1.00 0.96  CALCIUM 9.2 8.6*   Liver Function Tests: Recent Labs  Lab 07/02/18 1903  AST 12*  ALT 9  ALKPHOS 84  BILITOT 0.7   PROT 5.8*  ALBUMIN 3.2*   No results for input(s): LIPASE, AMYLASE in the last 168 hours. No results for input(s): AMMONIA in the last 168 hours. CBC: Recent Labs  Lab 07/02/18 1601  WBC 5.9  HGB 12.6  HCT 41.6  MCV 87.6  PLT 280   Cardiac Enzymes: Recent Labs  Lab 07/02/18 2126  TROPONINI <0.03   BNP: Invalid input(s): POCBNP CBG: Recent Labs  Lab 07/02/18 1600  GLUCAP 98   D-Dimer No results for input(s): DDIMER in the last 72 hours. Hgb A1c No results for input(s): HGBA1C in the last 72 hours. Lipid Profile No results for input(s): CHOL, HDL, LDLCALC, TRIG, CHOLHDL, LDLDIRECT in the last 72 hours. Thyroid function studies No results for input(s): TSH, T4TOTAL, T3FREE, THYROIDAB in the last 72 hours.  Invalid input(s): FREET3 Anemia work up No results for input(s): VITAMINB12, FOLATE, FERRITIN, TIBC, IRON, RETICCTPCT in the last 72 hours. Urinalysis    Component Value Date/Time   COLORURINE AMBER (A) 07/02/2018 2010   APPEARANCEUR HAZY (A) 07/02/2018 2010   LABSPEC 1.020 07/02/2018 2010   PHURINE 5.0 07/02/2018 2010   GLUCOSEU NEGATIVE 07/02/2018 2010   GLUCOSEU NEGATIVE 04/13/2007 1417   HGBUR NEGATIVE 07/02/2018 2010   Staves NEGATIVE 07/02/2018 2010   KETONESUR 5 (A) 07/02/2018 2010   PROTEINUR 100 (A) 07/02/2018 2010   UROBILINOGEN 0.2 02/04/2015 1800   NITRITE POSITIVE (A) 07/02/2018 2010   LEUKOCYTESUR MODERATE (A) 07/02/2018 2010   Sepsis Labs Invalid input(s): PROCALCITONIN,  WBC,  LACTICIDVEN Microbiology Recent Results (from the past 240 hour(s))  Culture, Urine     Status: Abnormal   Collection Time: 07/03/18  1:33 AM  Result Value Ref Range Status   Specimen Description URINE, RANDOM  Final   Special Requests   Final    NONE Performed at [facility], [place] [address] [place], [place] [address]    Culture >=100,000 COLONIES/mL ESCHERICHIA COLI (A)  Final   Report Status 07/05/2018 FINAL  Final   Organism ID, Bacteria  ESCHERICHIA COLI (A)  Final      Susceptibility   Escherichia coli - MIC*    AMPICILLIN <=2 SENSITIVE Sensitive     CEFAZOLIN <=4 SENSITIVE Sensitive     CEFTRIAXONE <=1 SENSITIVE Sensitive     CIPROFLOXACIN <=0.25 SENSITIVE Sensitive     GENTAMICIN <=1 SENSITIVE Sensitive     IMIPENEM <=  0.25 SENSITIVE Sensitive     NITROFURANTOIN <=16 SENSITIVE Sensitive     TRIMETH/SULFA <=20 SENSITIVE Sensitive     AMPICILLIN/SULBACTAM <=2 SENSITIVE Sensitive     PIP/TAZO <=4 SENSITIVE Sensitive     Extended ESBL NEGATIVE Sensitive     * >=100,000 COLONIES/mL ESCHERICHIA COLI     Time coordinating discharge:32  minutes  SIGNED:   Cordelia Poche, MD  [facility] 07/07/2018, 2:31 PM Pager   If 7PM-7AM, please contact night-coverage [email or web address] Password [email or web address]

## 2018-07-07 MED ORDER — OXYCODONE-ACETAMINOPHEN 5-325 MG PO TABS: 1.0000 | ORAL_TABLET | ORAL | 0 refills | Status: DC | PRN

## 2018-07-07 MED ORDER — NITROFURANTOIN MONOHYD MACRO 100 MG PO CAPS: 100.0000 mg | ORAL_CAPSULE | Freq: Two times a day (BID) | ORAL | 0 refills | Status: AC

## 2018-07-07 NOTE — Progress Notes (Signed)
Physical Therapy Treatment Patient Details Name: Dorothy White MRN: [id number] DOB: Nov 11, 1950 Today's Date: 07/07/2018    History of Present Illness Dorothy White is a 67 y.o. female with medical history significant of anxiety, depression, GERD, hyperlipidemia, L hip hemiarthroplasty, nonischemic cardiomyopathy, CVA presenting to the hospital for evaluation of falls.  Patient states she fell a few days ago and got a concussion.  She was in the bathroom and slipped, fell and hit her head on the toilet.  States since then she has had 3-4 additional episodes of falls at home.  CT showed L SDH.     PT Comments    Pt performed gait training followed by standing exercises to improve strength and balance.  Pt continues to drift with gait and requires min guard for safety.  Pt reports she lives with a room mate who will be home intermittently.  Based on current level of function, fatigue, and balance deficits placement at short term SNF remains appropriate.  Placement will allow for progression of mobility to return home in a more independent state.  Plan next session for continued higher level balance activities to tolerance.  She fatigued quickly today during session.      Follow Up Recommendations  CIR     Equipment Recommendations  None recommended by PT    Recommendations for Other Services Rehab consult;OT consult;Speech consult     Precautions / Restrictions Precautions Precautions: Fall Restrictions Weight Bearing Restrictions: No    Mobility  Bed Mobility Overal bed mobility: Modified Independent Bed Mobility: Supine to Sit     Supine to sit: Modified independent (Device/Increase time)     General bed mobility comments: Performed without assistance and good technique.    Transfers Overall transfer level: Needs assistance Equipment used: None Transfers: Sit to/from Stand Sit to Stand: Min guard         General transfer comment: Pt performed sit to stand  with min guard for safety.    Ambulation/Gait Ambulation/Gait assistance: Min guard Gait Distance (Feet): 150 Feet Assistive device: Straight cane Gait Pattern/deviations: Step-through pattern;Decreased stride length;Narrow base of support;Drifts right/left Gait velocity: decreased   General Gait Details: Pt with remains to drift with gait sequencing.  Reports vision is blurry and that she is seeing double.  LOB x2 with min guard to correct.  At times poor sequencing is noted with [facility] but able to correct sequencing.     Stairs Stairs: Yes Stairs assistance: Min guard Stair Management: One rail Right Number of Stairs: 4 General stair comments: Cues for sequencing and safety.  Pt reliant on use of railing.    Wheelchair Mobility    Modified Rankin (Stroke Patients Only)       Balance     Sitting balance-Leahy Scale: Good       Standing balance-Leahy Scale: Fair                              Cognition Arousal/Alertness: Awake/alert Behavior During Therapy: Flat affect Overall Cognitive Status: Impaired/Different from baseline Area of Impairment: Safety/judgement                   Current Attention Level: Sustained Memory: Decreased short-term memory   Safety/Judgement: Decreased awareness of safety Awareness: Emergent Problem Solving: Slow processing;Requires verbal cues General Comments: required cuing for some simple tasks       Exercises General Exercises - Lower Extremity Hip ABduction/ADduction: AROM;Both;15 reps;Standing Heel Raises: YPPJ;09  reps;Both;Standing Mini-Sqauts: AROM;15 reps;Both;Standing    General Comments        Pertinent Vitals/Pain Pain Assessment: 0-10 Pain Score: 8  Pain Location: head Pain Descriptors / Indicators: Aching Pain Intervention(s): Monitored during session;Repositioned    Home Living                      Prior Function            PT Goals (current goals can now be found in the  care plan section) Acute Rehab PT Goals Patient Stated Goal: "To get out of this hospital room." Potential to Achieve Goals: Good Additional Goals Additional Goal #1: Pt will score >46 on Berg balance with cane Progress towards PT goals: Progressing toward goals    Frequency    Min 4X/week      PT Plan Current plan remains appropriate    Co-evaluation              AM-PAC PT "6 Clicks" Mobility   Outcome Measure  Help needed turning from your back to your side while in a flat bed without using bedrails?: None Help needed moving from lying on your back to sitting on the side of a flat bed without using bedrails?: None Help needed moving to and from a bed to a chair (including a wheelchair)?: A Little Help needed standing up from a chair using your arms (e.g., wheelchair or bedside chair)?: A Little Help needed to walk in hospital room?: A Little Help needed climbing 3-5 steps with a railing? : A Little 6 Click Score: 20    End of Session Equipment Utilized During Treatment: Gait belt Activity Tolerance: Patient tolerated treatment well Patient left: in chair;with chair alarm set;with call bell/phone within reach Nurse Communication: Mobility status PT Visit Diagnosis: Unsteadiness on feet (R26.81);Repeated falls (R29.6);Difficulty in walking, not elsewhere classified (R26.2);Pain;Dizziness and giddiness (R42) Pain - part of body: (head )     Time: 1103-1594 PT Time Calculation (min) (ACUTE ONLY): 17 min  Charges:  $Gait Training: 8-22 mins                     Governor Rooks, PTA Acute Rehabilitation Services Pager [phone] Office [phone]     Dorothy White 07/07/2018, 10:24 AM

## 2018-07-07 NOTE — Progress Notes (Signed)
Pt. Being discharged via wheelchair. Has transportation to [facility]. Report given to nurse at facility. All belongings with patient. IV removed.

## 2018-07-07 NOTE — Care Management Important Message (Signed)
Important Message  Patient Details  Name: Dorothy White MRN: [id number] Date of Birth: [birth year]   Medicare Important Message Given:  Yes    Orbie Pyo 07/07/2018, 2:20 PM

## 2018-07-07 NOTE — Clinical Social Work Placement (Signed)
Nurse to call report to [phone], Room 222A  Nurse please have patient ready for transport around 2:30 PM. Please call patient's friend, Selinda Flavin [phone]), when patient is ready for transport, and he will come to pick up the patient.       CLINICAL SOCIAL WORK PLACEMENT  NOTE  Date:  07/07/2018  Patient Details  Name: Dorothy White MRN: [id number] Date of Birth: [birth year]  Clinical Social Work is seeking post-discharge placement for this patient at the [place] level of care (*CSW will initial, date and re-position this form in  chart as items are completed):  Yes   Patient/family provided with [place] Work Department's list of facilities offering this level of care within the geographic area requested by the patient (or if unable, by the patient's family).  Yes   Patient/family informed of their freedom to choose among providers that offer the needed level of care, that participate in Medicare, Medicaid or managed care program needed by the patient, have an available bed and are willing to accept the patient.  Yes   Patient/family informed of [facility]'s ownership interest in [employer] and [facility], as well as of the fact that they are under no obligation to receive care at these facilities.  PASRR submitted to EDS on       PASRR number received on       Existing PASRR number confirmed on       FL2 transmitted to all facilities in geographic area requested by pt/family on       FL2 transmitted to all facilities within larger geographic area on       Patient informed that his/her managed care company has contracts with or will negotiate with certain facilities, including the following:        Yes   Patient/family informed of bed offers received.  Patient chooses bed at [facility]     Physician recommends and patient chooses bed at      Patient to be transferred to [facility] on  07/07/18.  Patient to be transferred to facility by Family car     Patient family notified on 07/07/18 of transfer.  Name of family member notified:  Blanca Friend     PHYSICIAN       Additional Comment:    _______________________________________________ Geralynn Ochs, LCSW 07/07/2018, 1:11 PM

## 2018-09-20 ENCOUNTER — Other Ambulatory Visit: Payer: Self-pay | Admitting: Neurosurgery

## 2018-09-20 DIAGNOSIS — S065X9A Traumatic subdural hemorrhage with loss of consciousness of unspecified duration, initial encounter: Secondary | ICD-10-CM

## 2018-09-22 IMAGING — CR DG LUMBAR SPINE COMPLETE 4+V
5 series · 5 of 5 positions shown · non-contrast
Comparison: None.

CLINICAL DATA: Worsening lower back pain after falling down stairs
3 days ago at home.

EXAM:
LUMBAR SPINE - COMPLETE 4+ VIEW

[t lumbar spine ap]
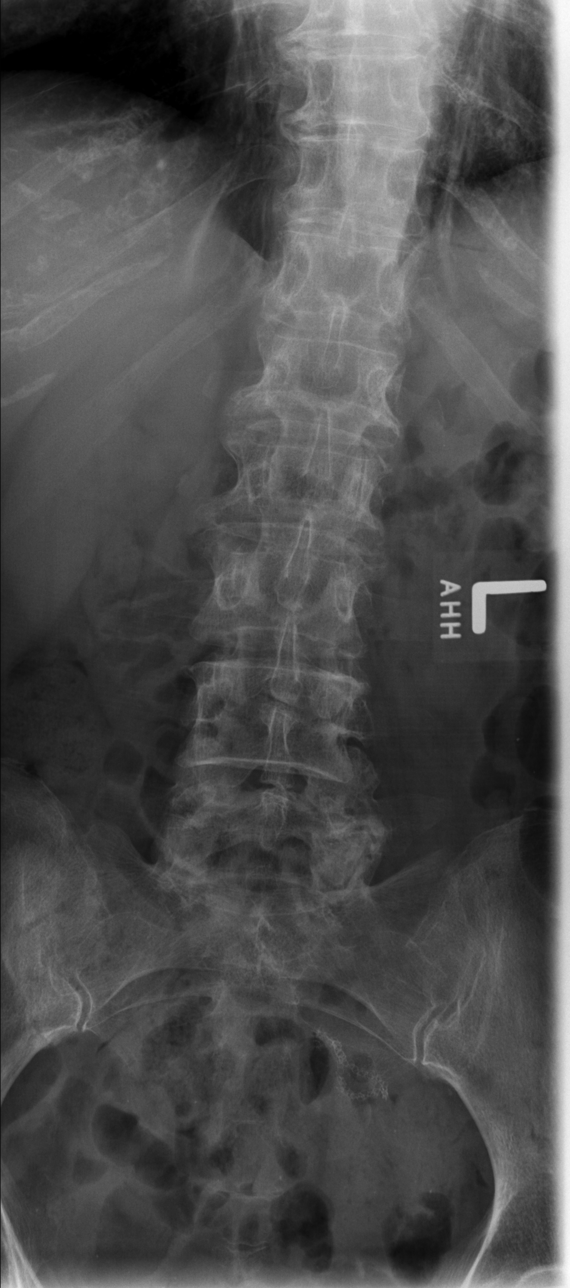

[t lumbar spine obl (1 of 2)]
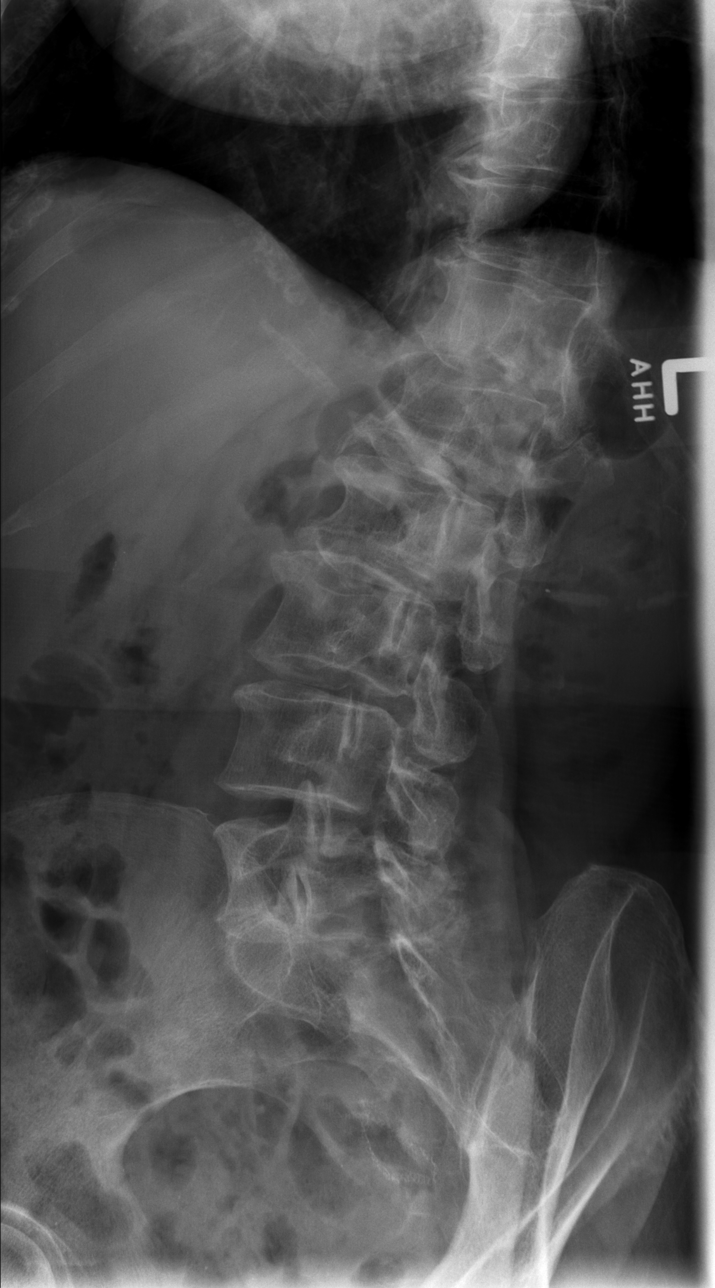

[t lumbar spine obl (2 of 2)]
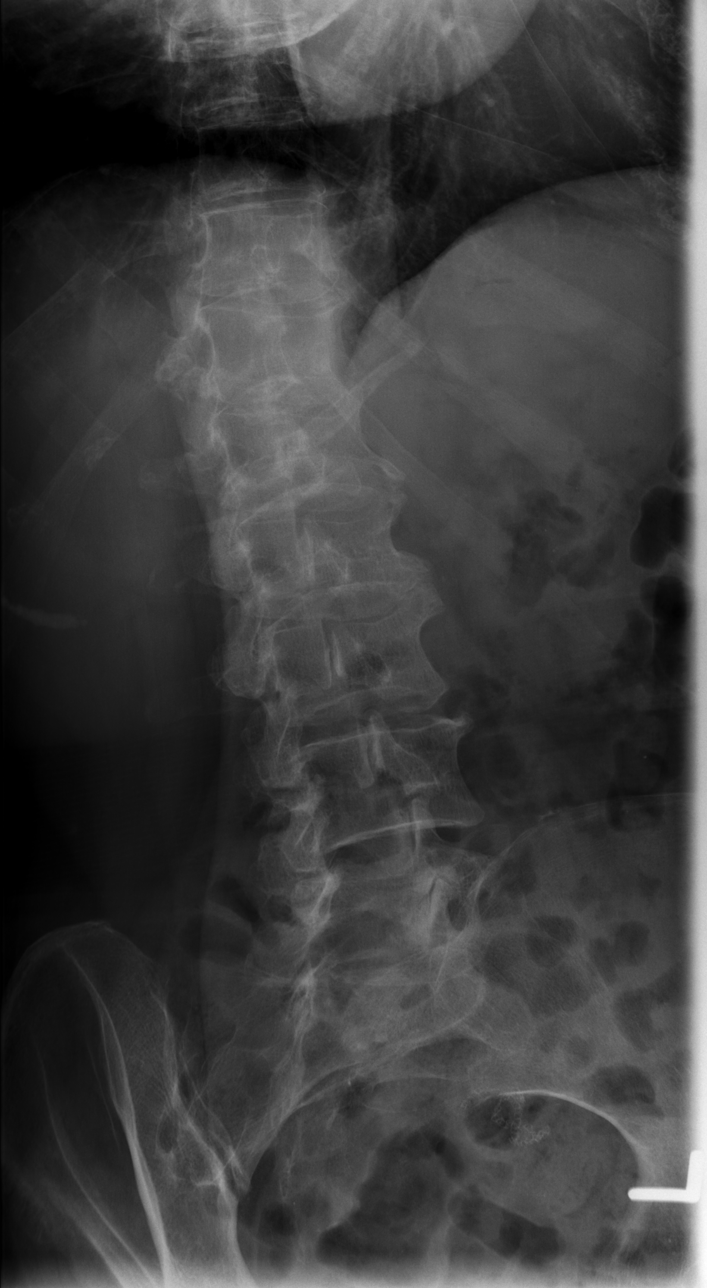

[t lumbar spine lat]
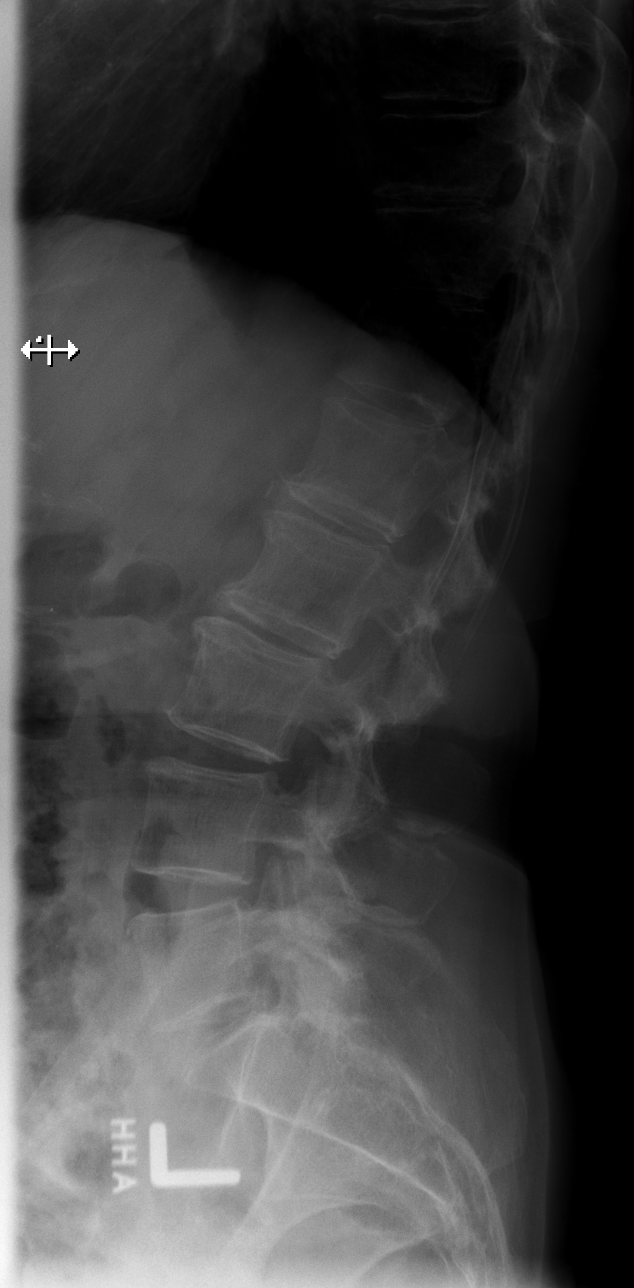

[t lumbar l-5 s-1 spot]
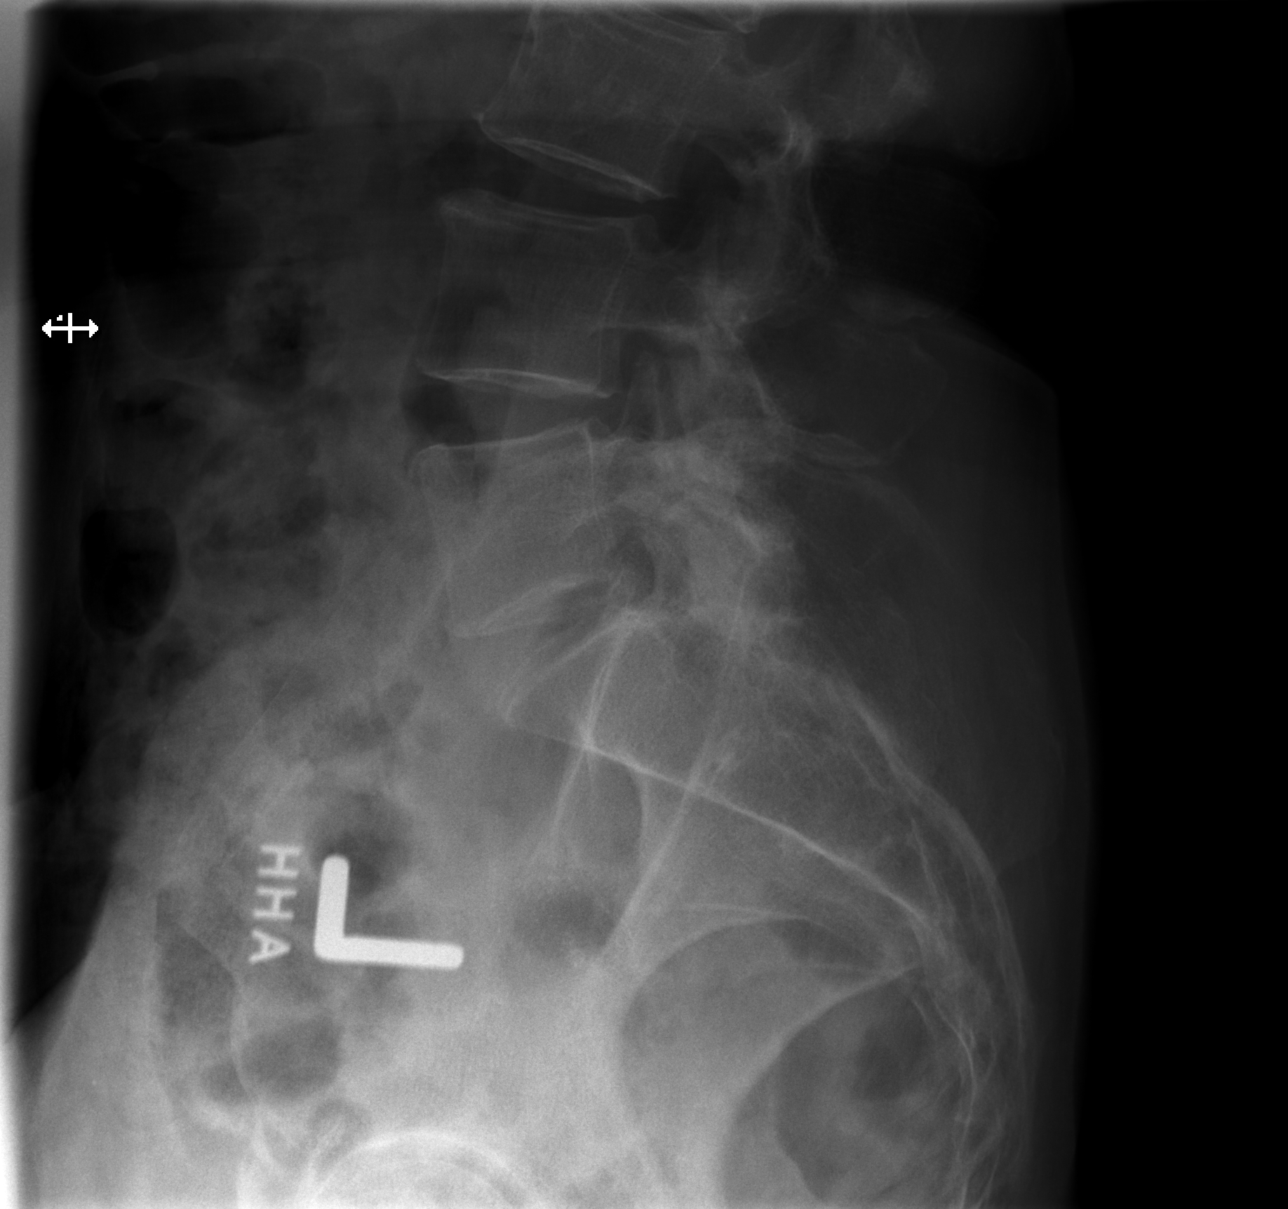

[5 of 5 positions shown; findings below may reference images not displayed]

FINDINGS: The lumbar vertebrae are normal in height. Irregularity at the pars
interarticularis at L4 on the left, unusual for an acute fracture
but it was not visible on the CT of 04/03/2016. Facet articulations
appear intact. No spondylolisthesis.
IMPRESSION: Vertebral column is intact. Cannot exclude a pars fracture at L4 on
the left. CT would be definitive.

## 2018-09-22 IMAGING — CT CT L SPINE W/O CM
3 of 4 series · 13 of 33 positions shown, 16 images · non-contrast
Comparison: Lumbar spine films today as well as CT 04/03/2016 .

CLINICAL DATA: Worsening low back pain after fall down stairs 3
days ago. Evaluate for possible pars fracture seen on plain
radiographs.

EXAM:
CT LUMBAR SPINE WITHOUT CONTRAST
TECHNIQUE: Multidetector CT imaging of the lumbar spine was performed without
intravenous contrast administration. Multiplanar CT image
reconstructions were also generated.

[Series 5: coronal · coronal · 0.31mm/px · 3 of 61 slices shown]
[im 13/61  bone]
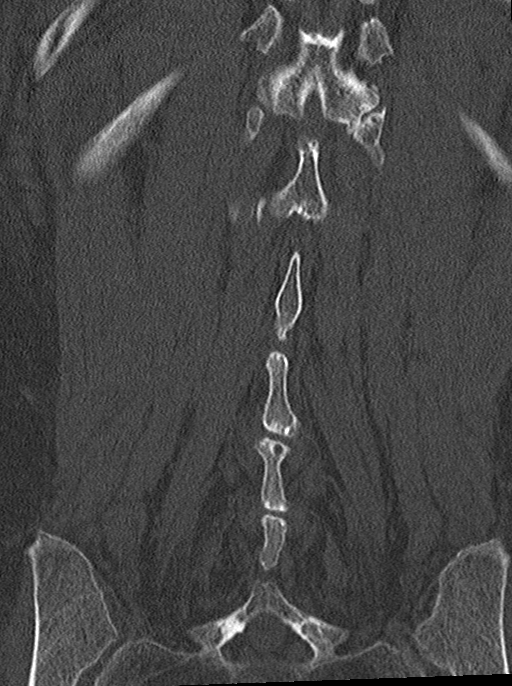
[im 25/61  bone]
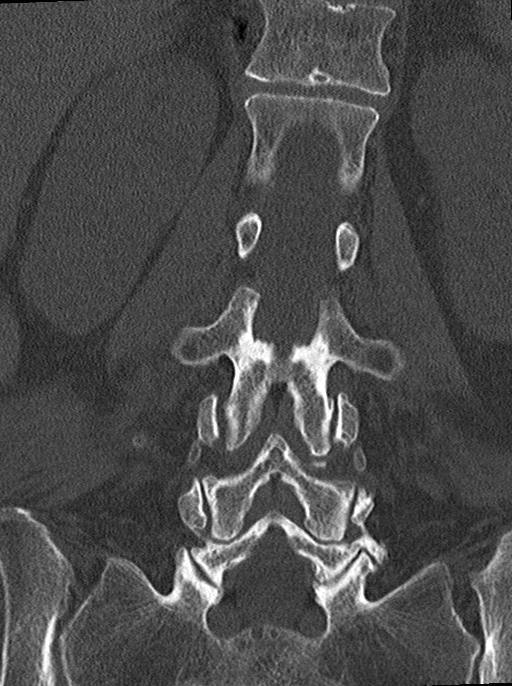
[im 37/61  bone]
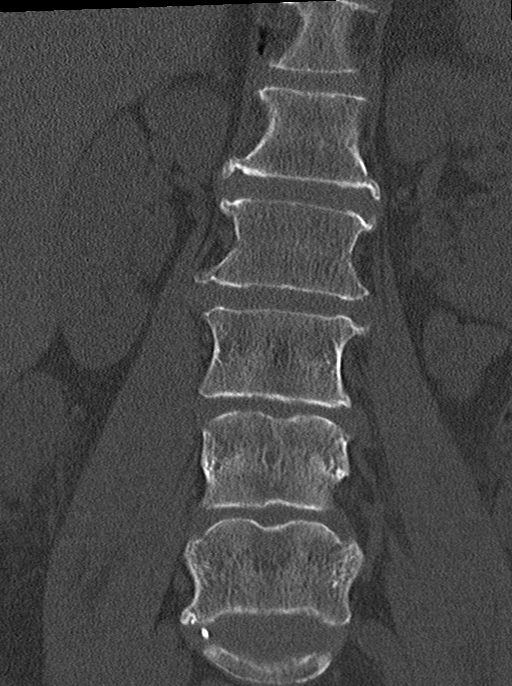

[Series 8: sagittal st · sagittal · 0.31mm/px · 5 of 61 slices shown, 6 images]
[im 21/61  bone]
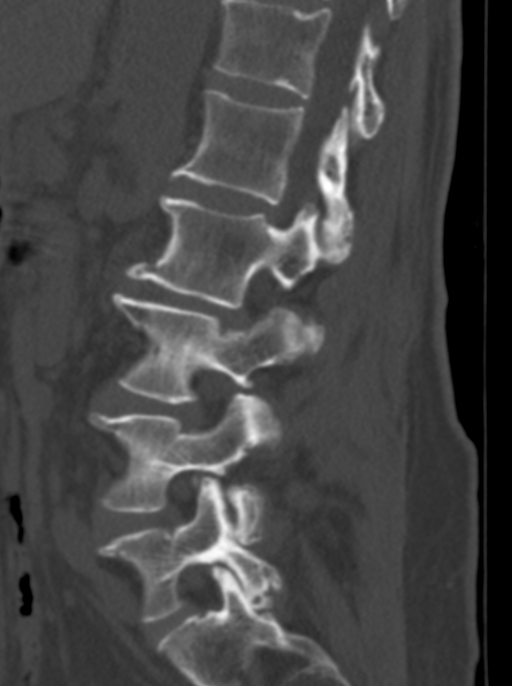
[im 26/61  bone]
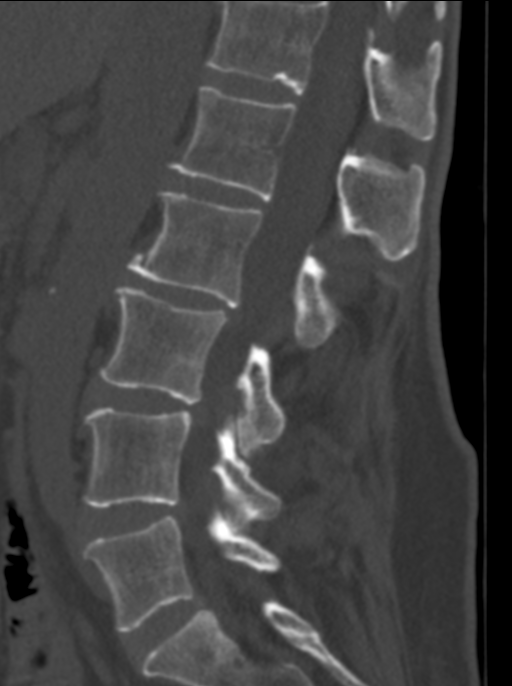
[im 31/61  soft-tissue]
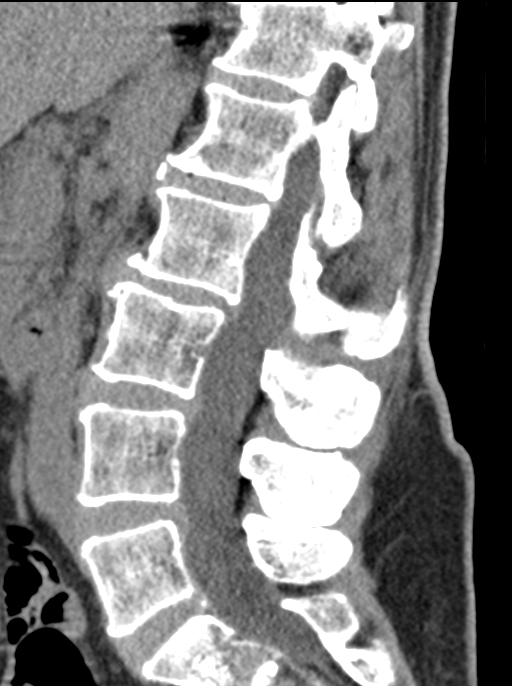
[im 31/61  bone]
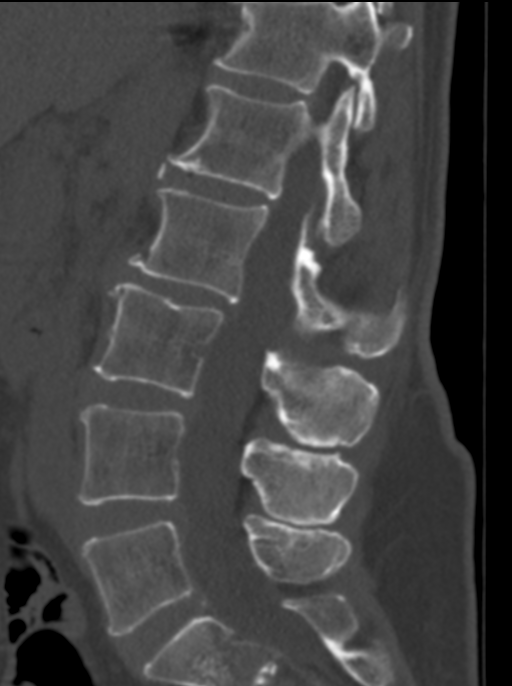
[im 36/61  bone]
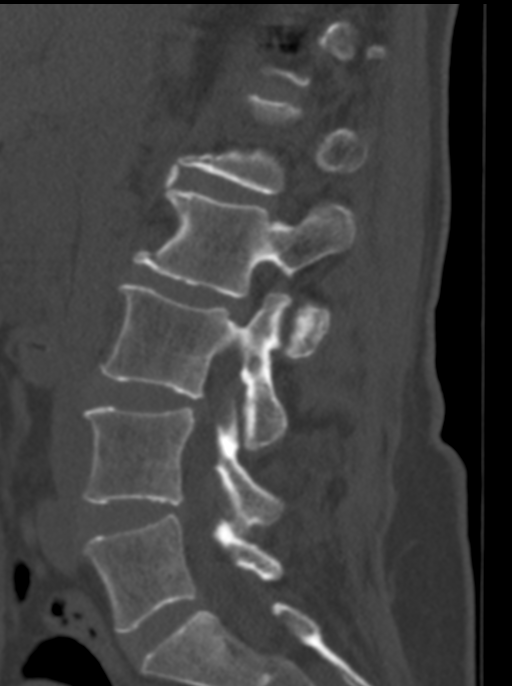
[im 41/61  bone]
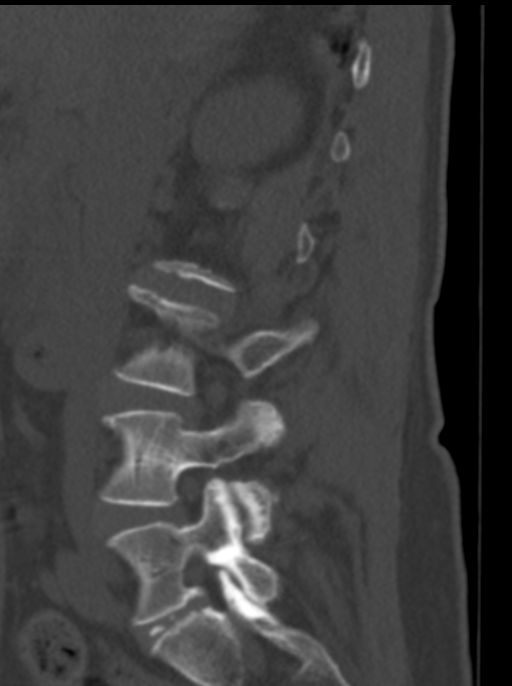

[Series 12: st · axial · 0.29mm/px · z∈[+1356,+1514]mm · 5 of 107 slices shown, 7 images]
[im 14/107  soft-tissue]
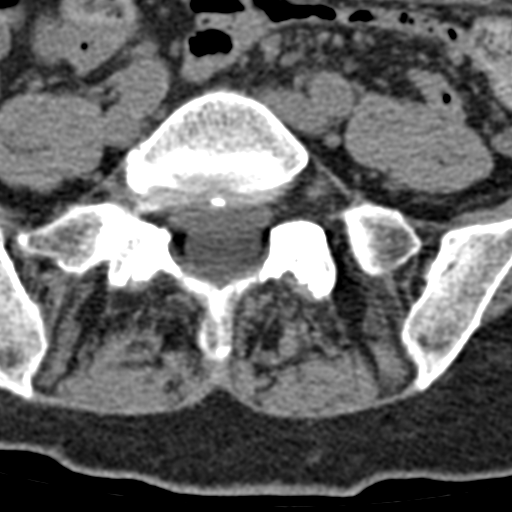
[im 14/107  bone]
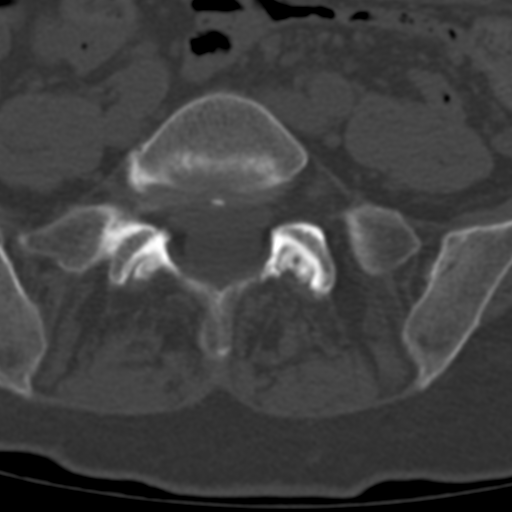
[im 40/107  bone]
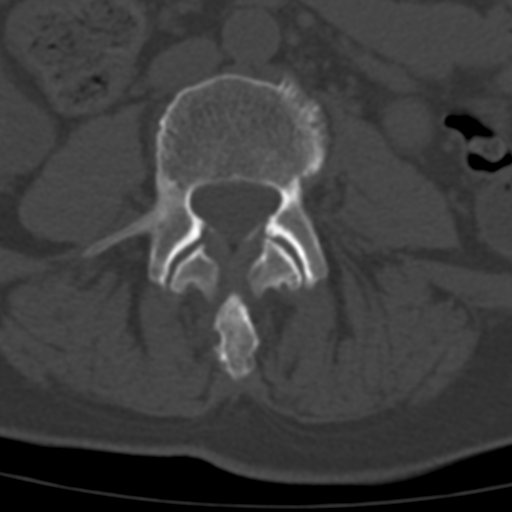
[im 54/107  bone]
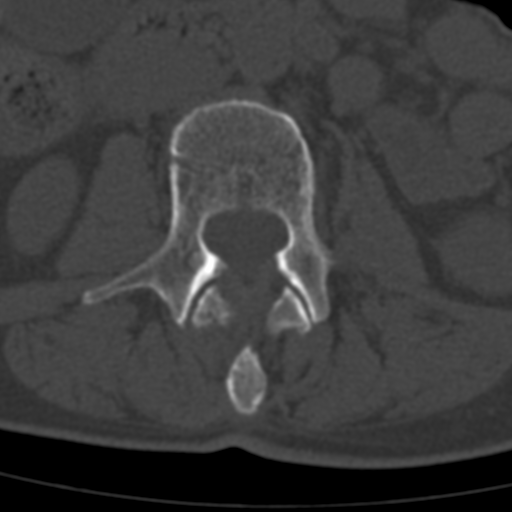
[im 67/107  bone]
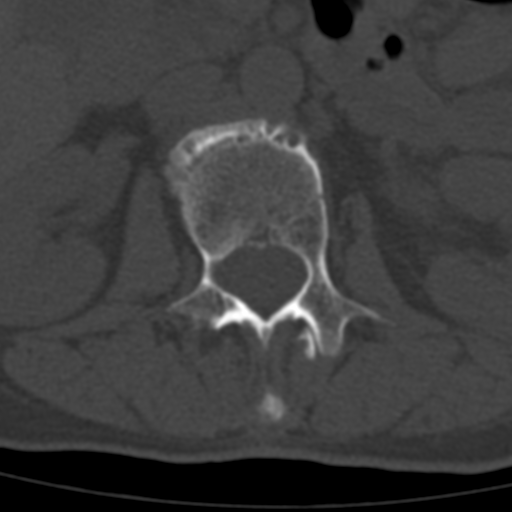
[im 93/107  soft-tissue]
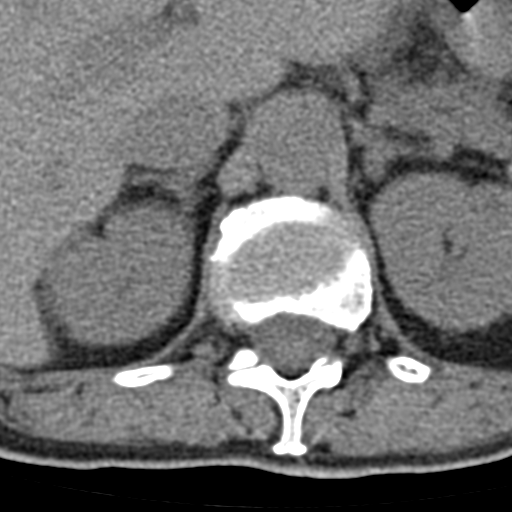
[im 93/107  bone]
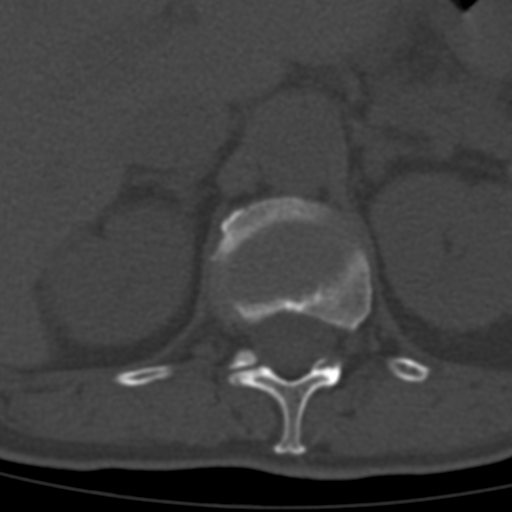

[13 of 33 positions shown; findings below may reference images not displayed]

FINDINGS: Segmentation: Within normal.

Alignment: Within normal.

Vertebrae: Mild spondylosis throughout the lumbar spine. Vertebral
body heights are maintained. Facet arthropathy most prominent over
the lower lumbar spine. No evidence of spondylolysis.

Paraspinal and other soft tissues: Within normal.

Disc levels: Mild broad-based disc bulge at the L2-3 level without
disc herniation, canal stenosis or neural foraminal narrowing.
Minimal broad-based disc bulge at the L3-4 level without focal disc
herniation or neural foraminal narrowing. No significant canal
stenosis. Mild broad-based disc bulge at the L4-5 level without disc
herniation. No significant canal stenosis. No significant neural
foraminal narrowing. Minimal broad-based disc bulge at the L5-S1
level without disc herniation or canal stenosis. No significant
neural foraminal narrowing.
IMPRESSION: Mild spondylosis of the lumbar spine with mild multilevel disc
disease. No definite disc herniation or canal stenosis. No
significant neural foraminal narrowing. No evidence of
spondylolysis.

## 2018-09-22 IMAGING — CR DG SACRUM/COCCYX 2+V
3 series · 3 of 3 positions shown · non-contrast
Comparison: None.

CLINICAL DATA: Fall down stairs 3 days ago with persistent low back
pain, initial encounter

EXAM:
SACRUM AND COCCYX - 2+ VIEW

[t sacrum ap]
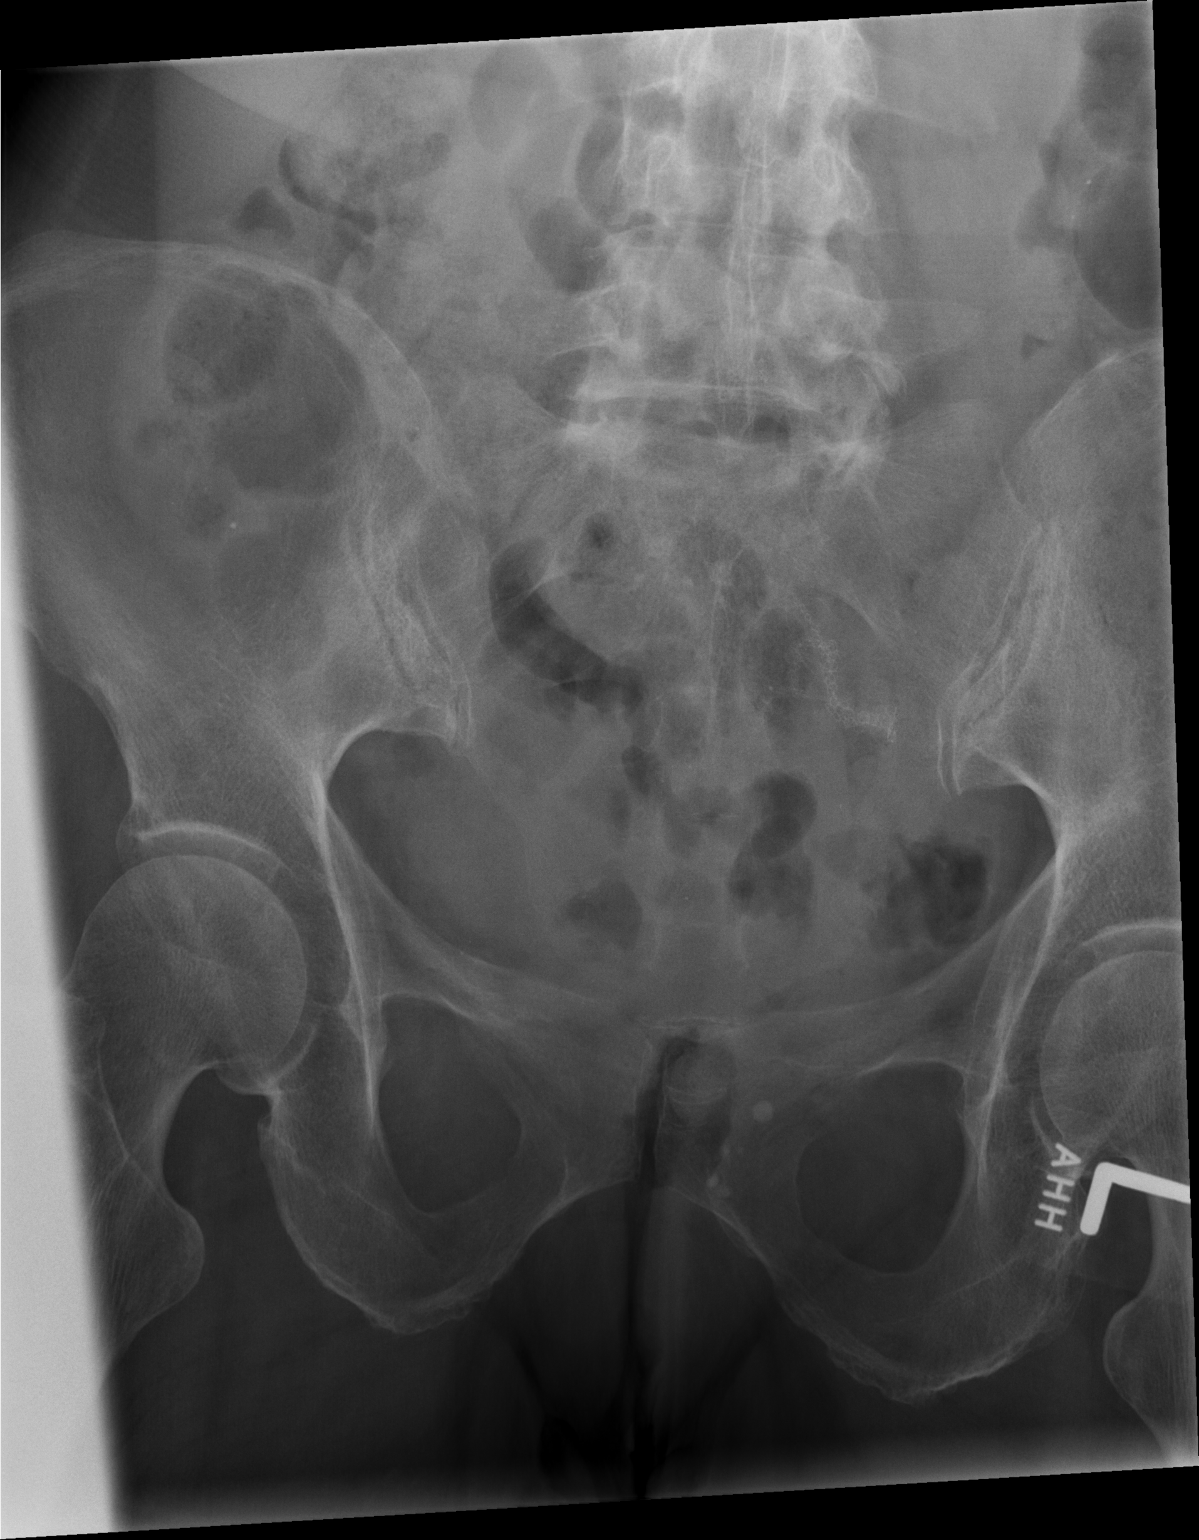

[t coccyx ap]
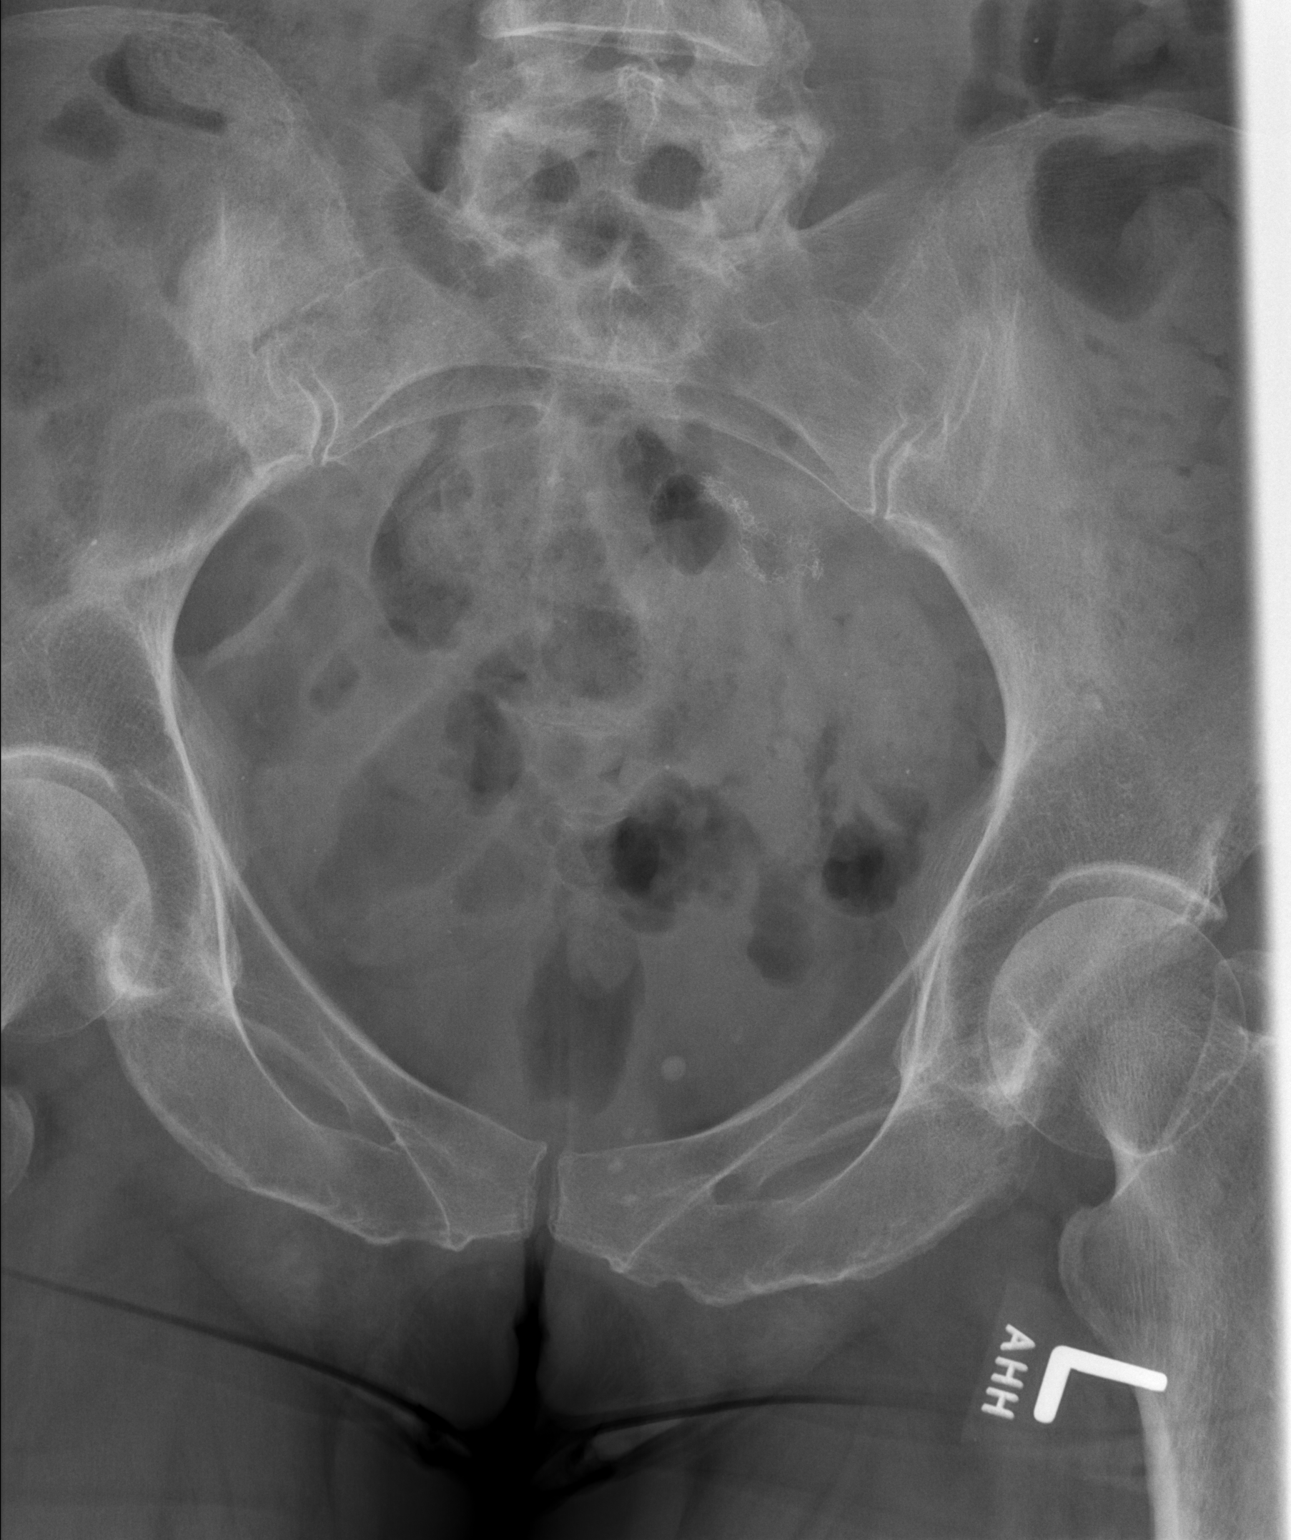

[t sacrum coccyx lat]
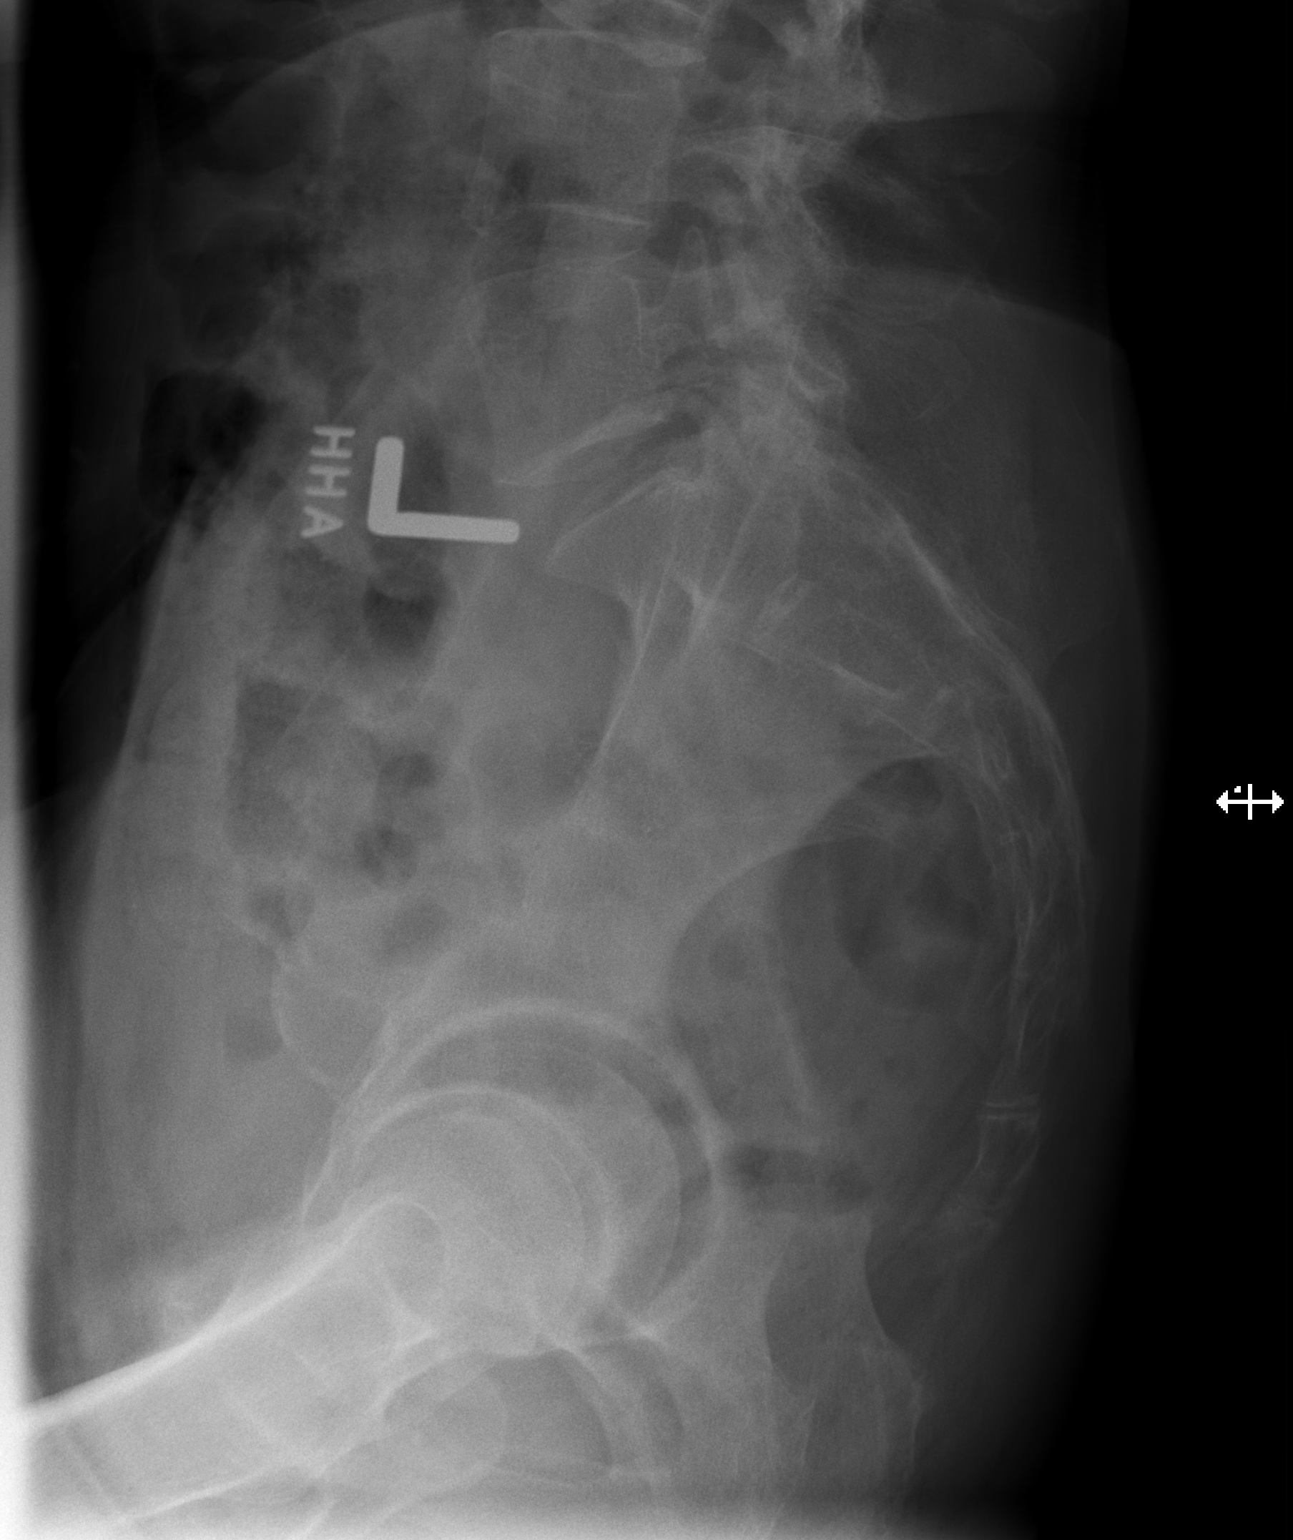

[3 of 3 positions shown; findings below may reference images not displayed]

FINDINGS: Pelvic ring is intact. No acute fracture is seen. No soft tissue
abnormality is noted..
IMPRESSION: No acute abnormality seen.

## 2018-09-23 IMAGING — CT CT HEAD W/O CM
3 series · 16 of 47 positions shown, 19 images · non-contrast
Comparison: 12/17/2015

CLINICAL DATA: Bilateral leg weakness.  Symptoms for 1 day.

EXAM:
CT HEAD WITHOUT CONTRAST
TECHNIQUE: Contiguous axial images were obtained from the base of the skull
through the vertex without intravenous contrast.

[Series 3: head 5.0 h30s · axial · 0.41mm/px · z∈[-150,-16]mm · 10 of 33 slices shown, 13 images]
[im 3/33  brain]
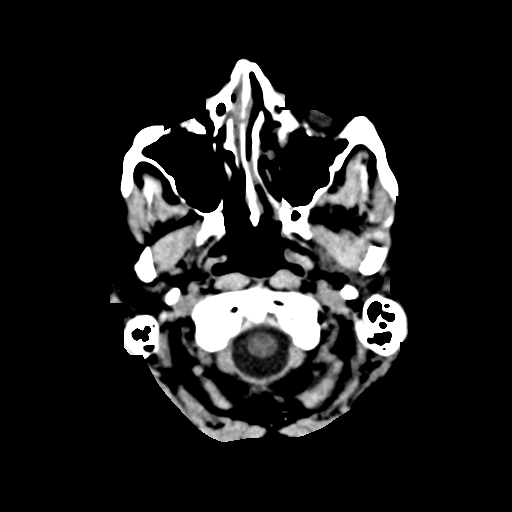
[im 3/33  bone]
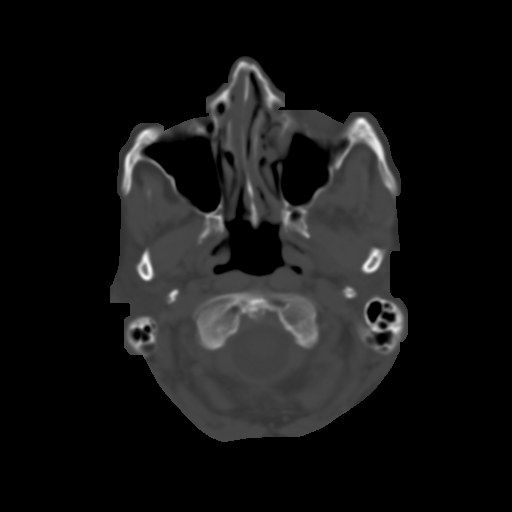
[im 6/33  brain]
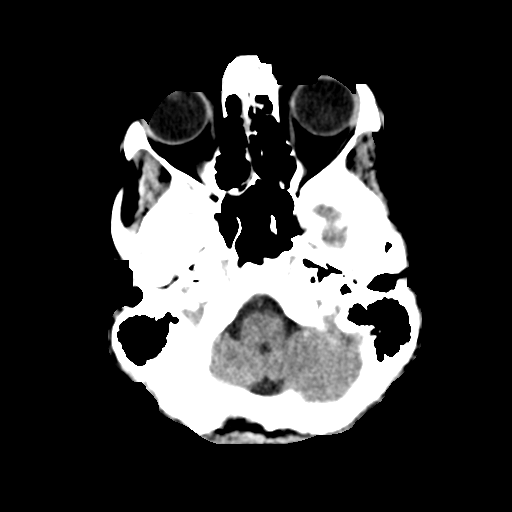
[im 9/33  brain]
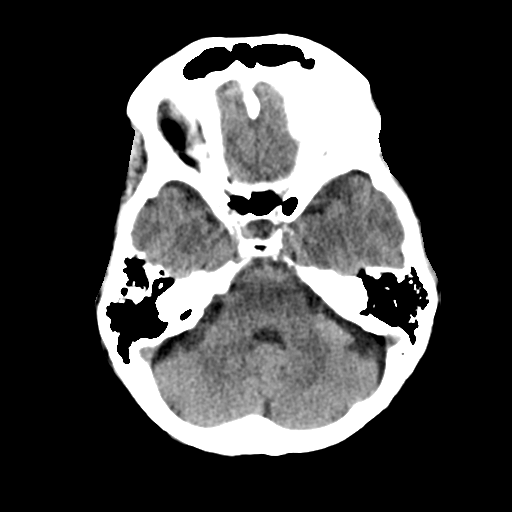
[im 12/33  brain]
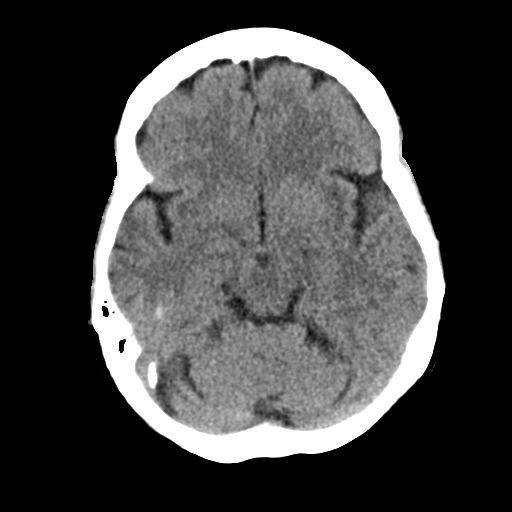
[im 15/33  brain]
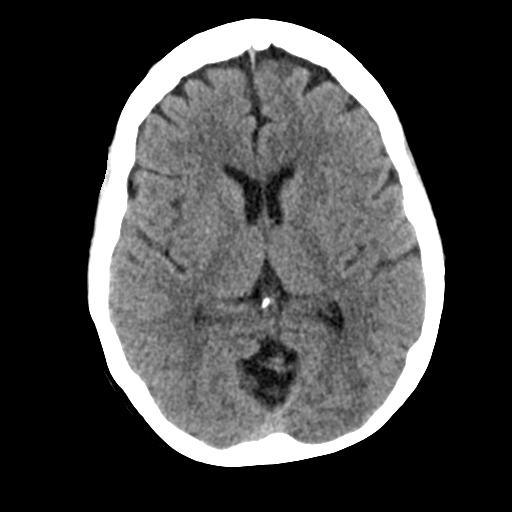
[im 15/33  bone]
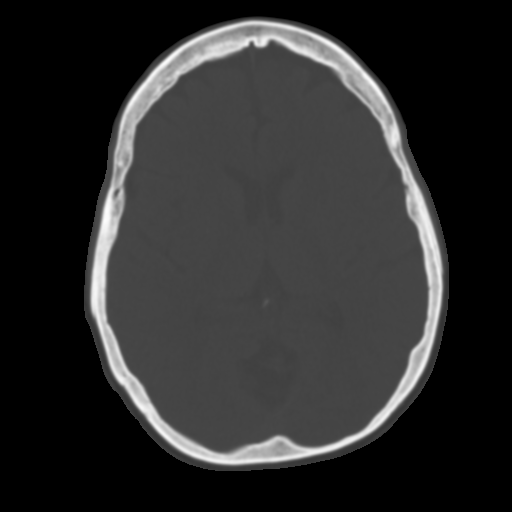
[im 18/33  brain]
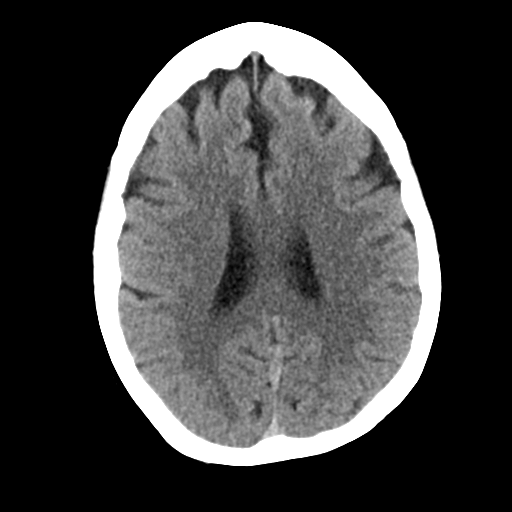
[im 21/33  brain]
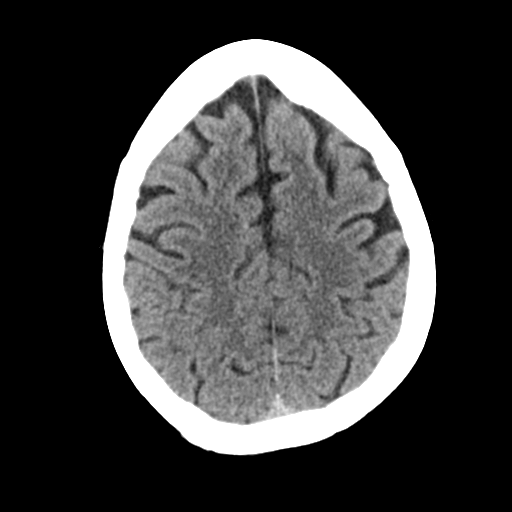
[im 25/33  brain]
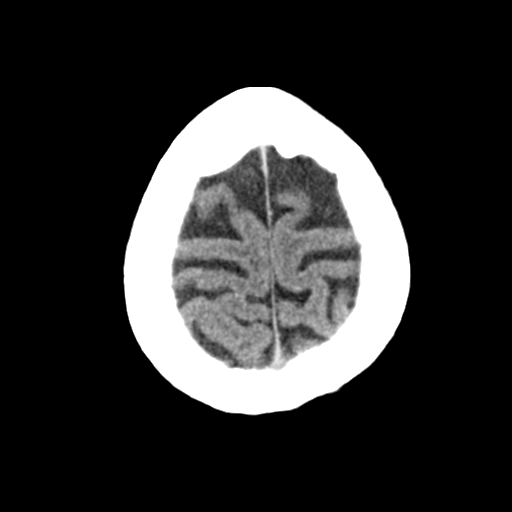
[im 27/33  brain]
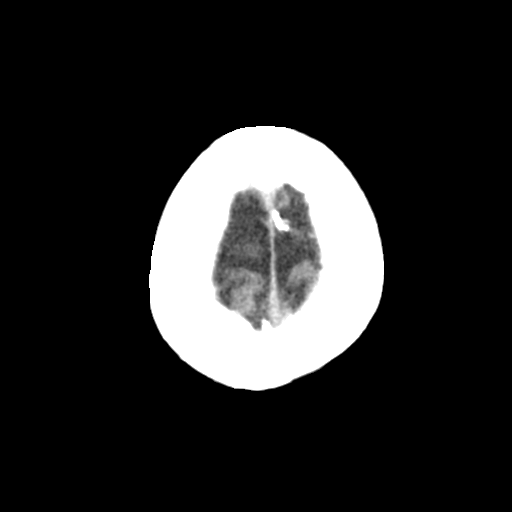
[im 27/33  bone]
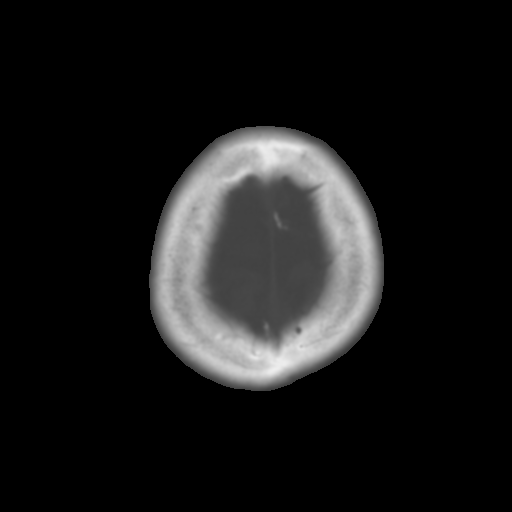
[im 30/33  brain]
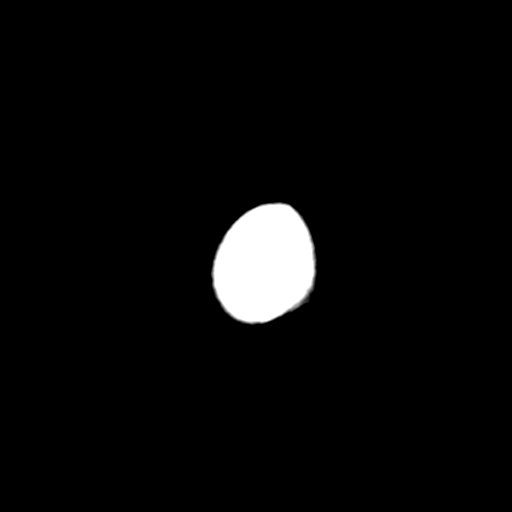

[Series 5: head 3.0 mpr cor · coronal · 0.32mm/px · 3 of 67 slices shown]
[im 23/67  brain]
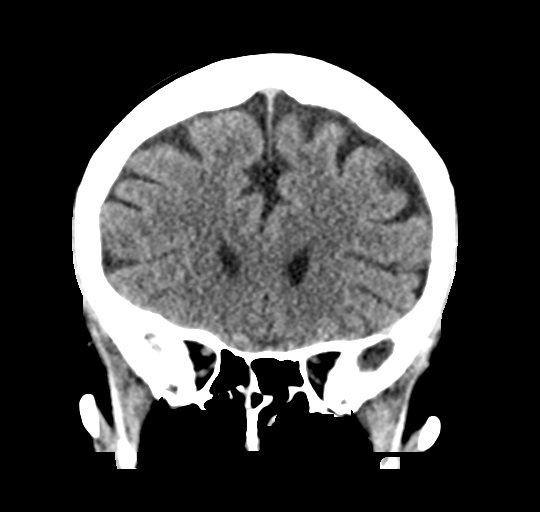
[im 30/67  brain]
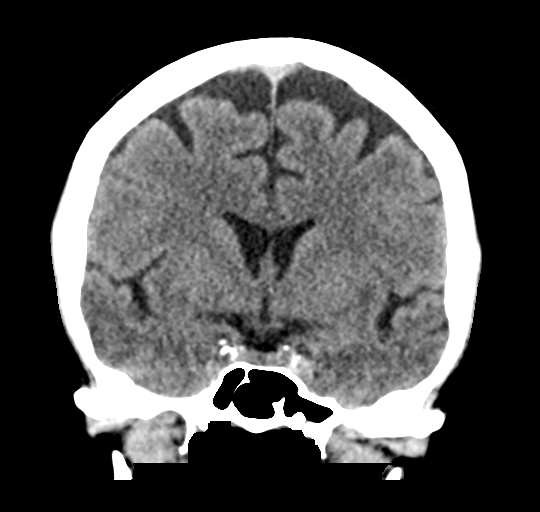
[im 37/67  brain]
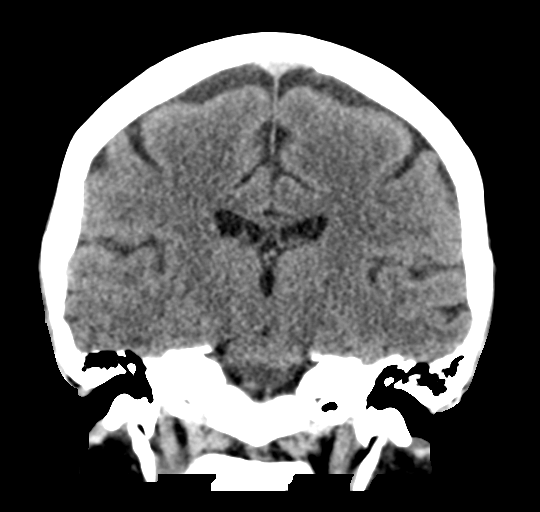

[Series 6: head 3.0 mpr sag · sagittal · 0.31mm/px · 3 of 58 slices shown]
[im 20/58  brain]
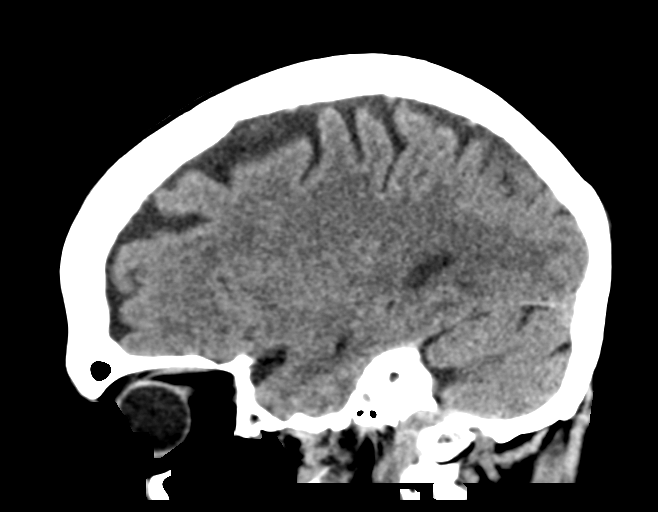
[im 29/58  brain]
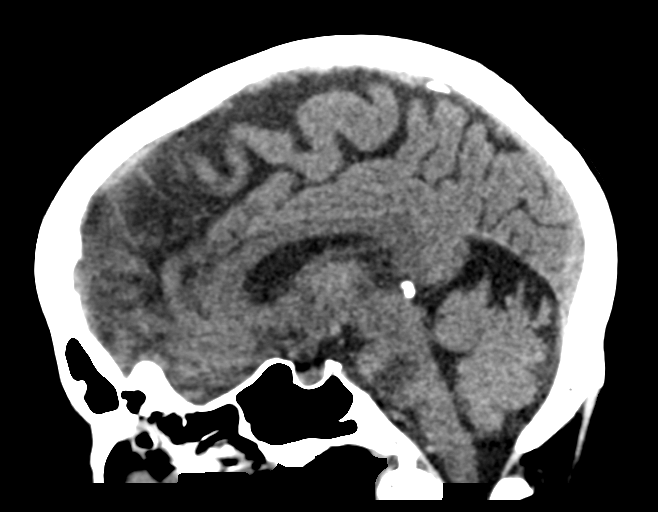
[im 39/58  brain]
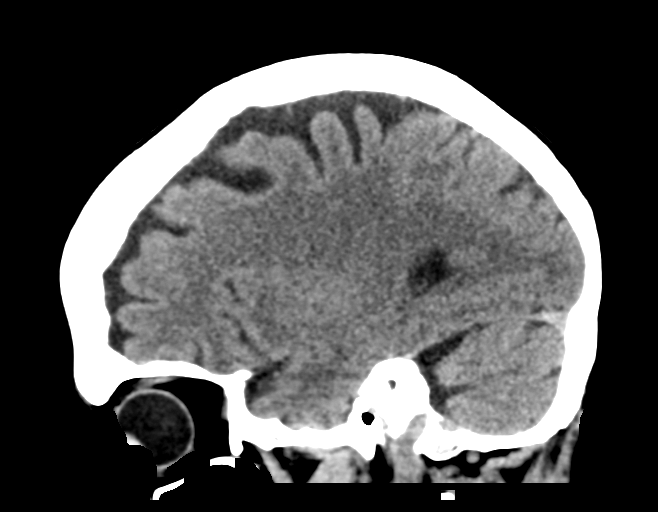

[16 of 47 positions shown; findings below may reference images not displayed]

FINDINGS: Brain: No mass effect, midline shift, or acute hemorrhage.
Ventricular system and extra-axial space are within normal limits.

Vascular: No hyperdense vessel or unexpected calcification.

Skull: Cranium is intact.

Sinuses/Orbits: Mastoid air cells are clear. Mucosal thickening in
the ethmoid air cells. There is some mucosal thickening in the
medial left maxillary sinus. Sphenoid sinuses and frontal sinuses
are clear.

Other: Stable nasal fracture.
IMPRESSION: No acute intracranial pathology.

## 2018-10-11 ENCOUNTER — Other Ambulatory Visit: Payer: Self-pay

## 2018-10-25 IMAGING — CR DG CHEST 2V
2 series · 2 of 2 positions shown · non-contrast
Comparison: 01/23/2016 chest radiograph.

CLINICAL DATA: Chest pain

EXAM:
CHEST  2 VIEW

[chest pa]
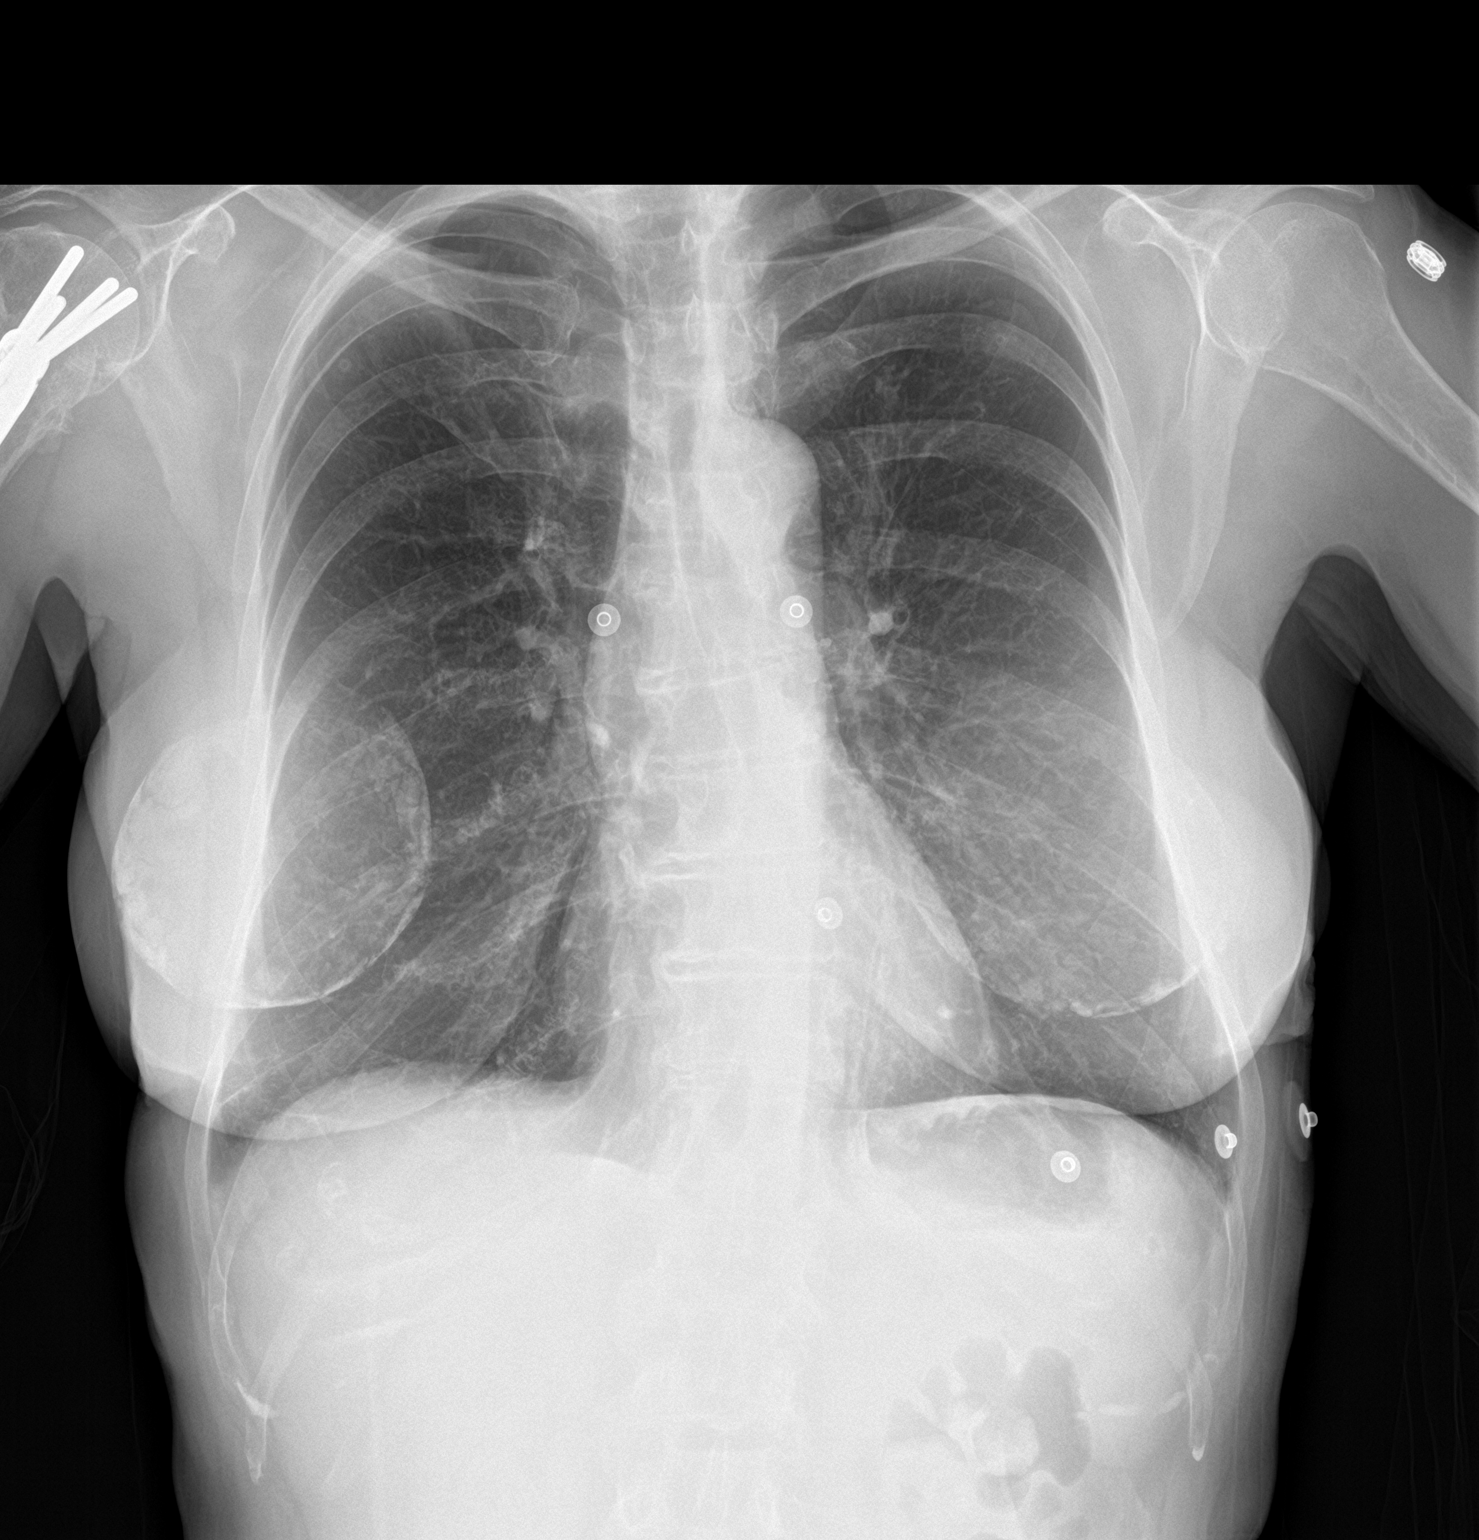

[chest lat]
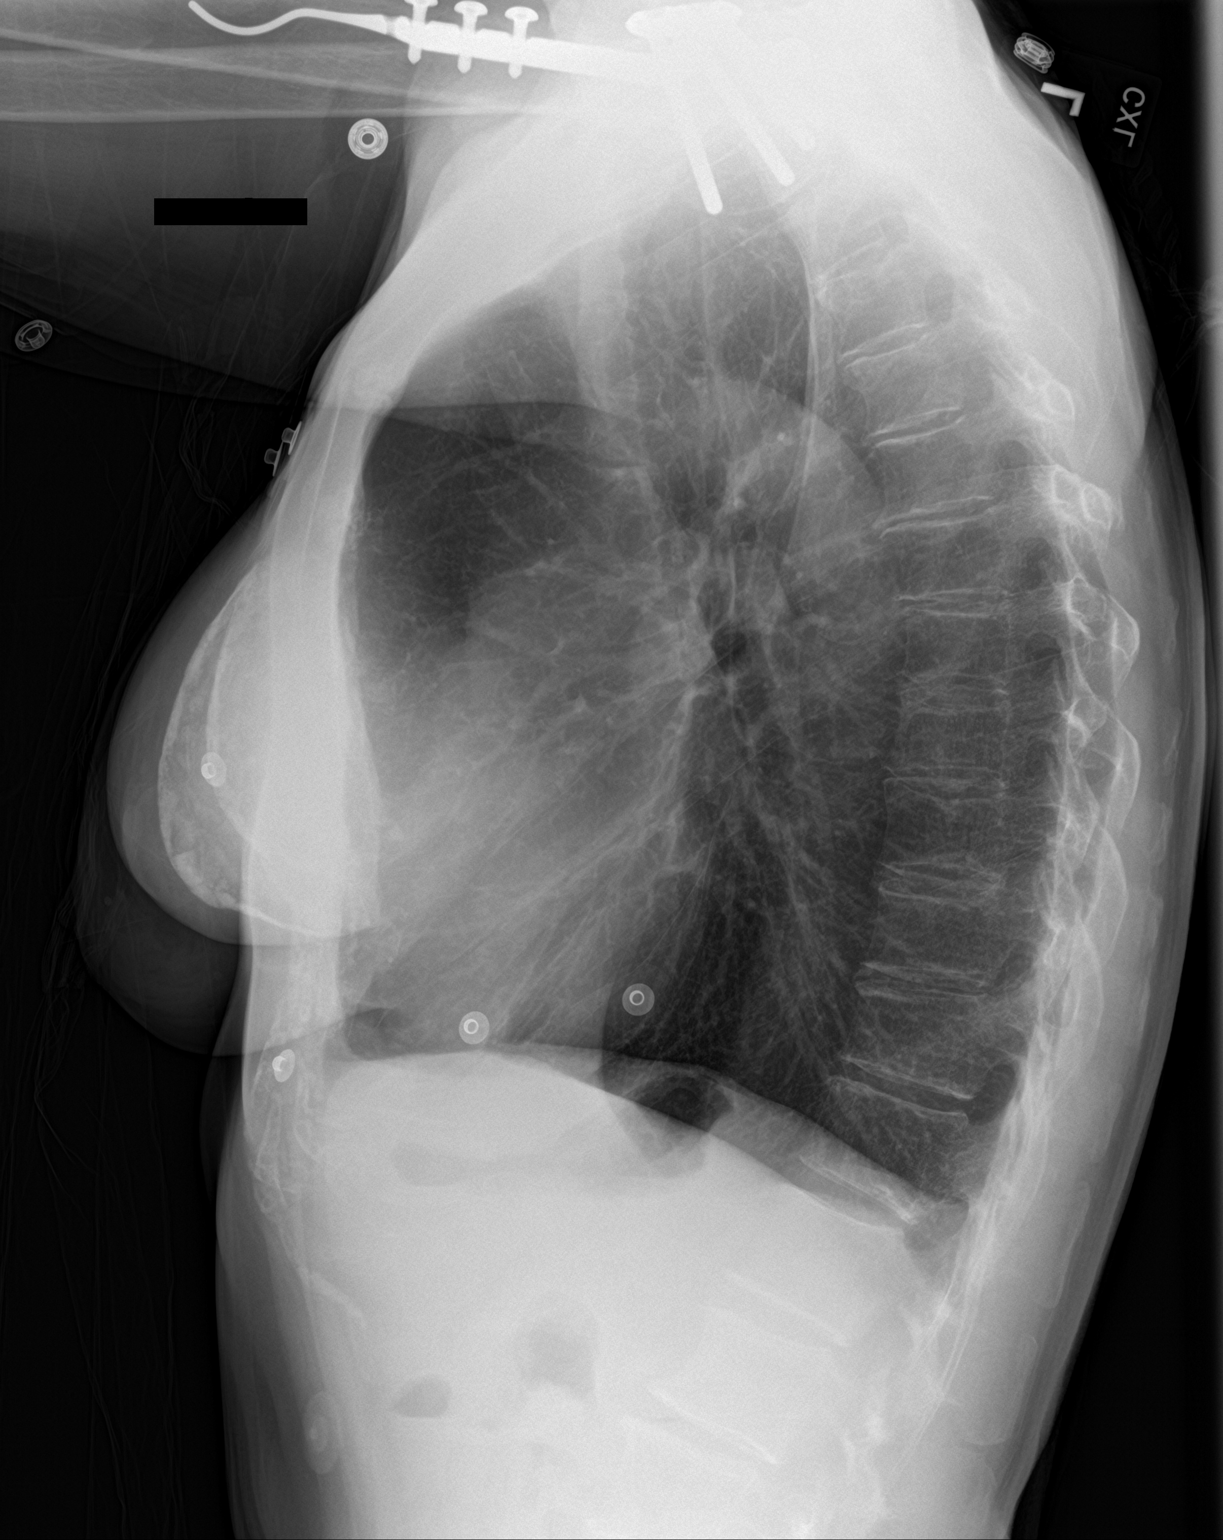

[2 of 2 positions shown; findings below may reference images not displayed]

FINDINGS: Partially visualized surgical hardware in the right proximal
humerus. Bilateral breast prostheses with capsular calcification.
Stable cardiomediastinal silhouette with normal heart size. No
pneumothorax. No pleural effusion. Mildly hyperinflated lungs. No
pulmonary edema. No acute consolidative airspace disease.
IMPRESSION: 1. No acute cardiopulmonary disease.
2. Mildly hyperinflated lungs, which may indicate obstructive lung
disease.

## 2018-10-26 ENCOUNTER — Ambulatory Visit
Admission: RE | Admit: 2018-10-26 | Discharge: 2018-10-26 | Disposition: A | Discharge status: Home / Self Care | Payer: Medicare Other | Source: Ambulatory Visit | Attending: Neurosurgery | Admitting: Neurosurgery

## 2018-10-26 ENCOUNTER — Other Ambulatory Visit: Payer: Self-pay

## 2018-10-26 DIAGNOSIS — S065X9A Traumatic subdural hemorrhage with loss of consciousness of unspecified duration, initial encounter: Secondary | ICD-10-CM

## 2018-12-25 IMAGING — CR DG CHEST 2V
2 series · 2 of 2 positions shown · non-contrast
Comparison: Chest radiograph September 26, 2016

CLINICAL DATA: Fell down steps 4 days ago. Unsteady gait and
weakness.

EXAM:
CHEST  2 VIEW

[w chest lat]
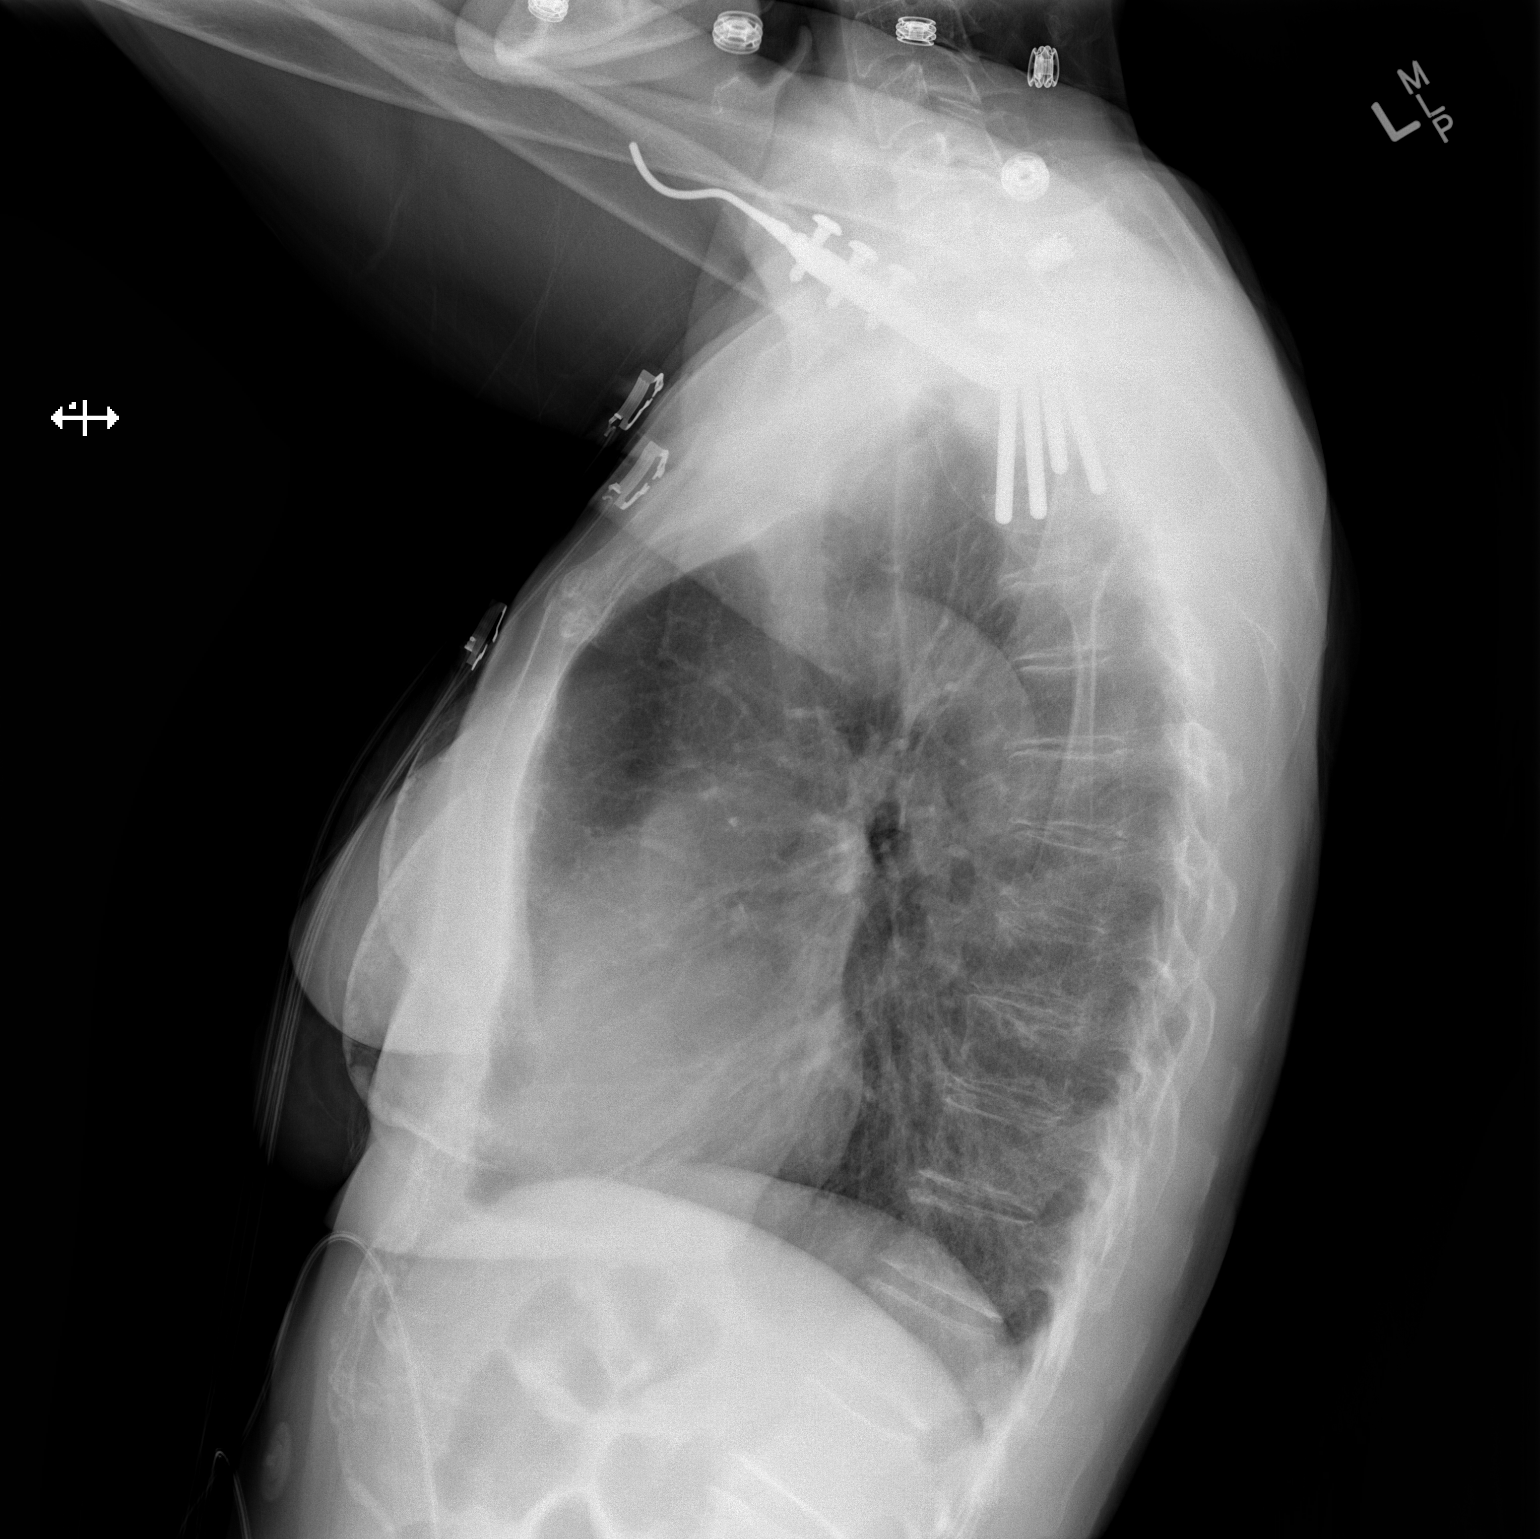

[w chest pa]
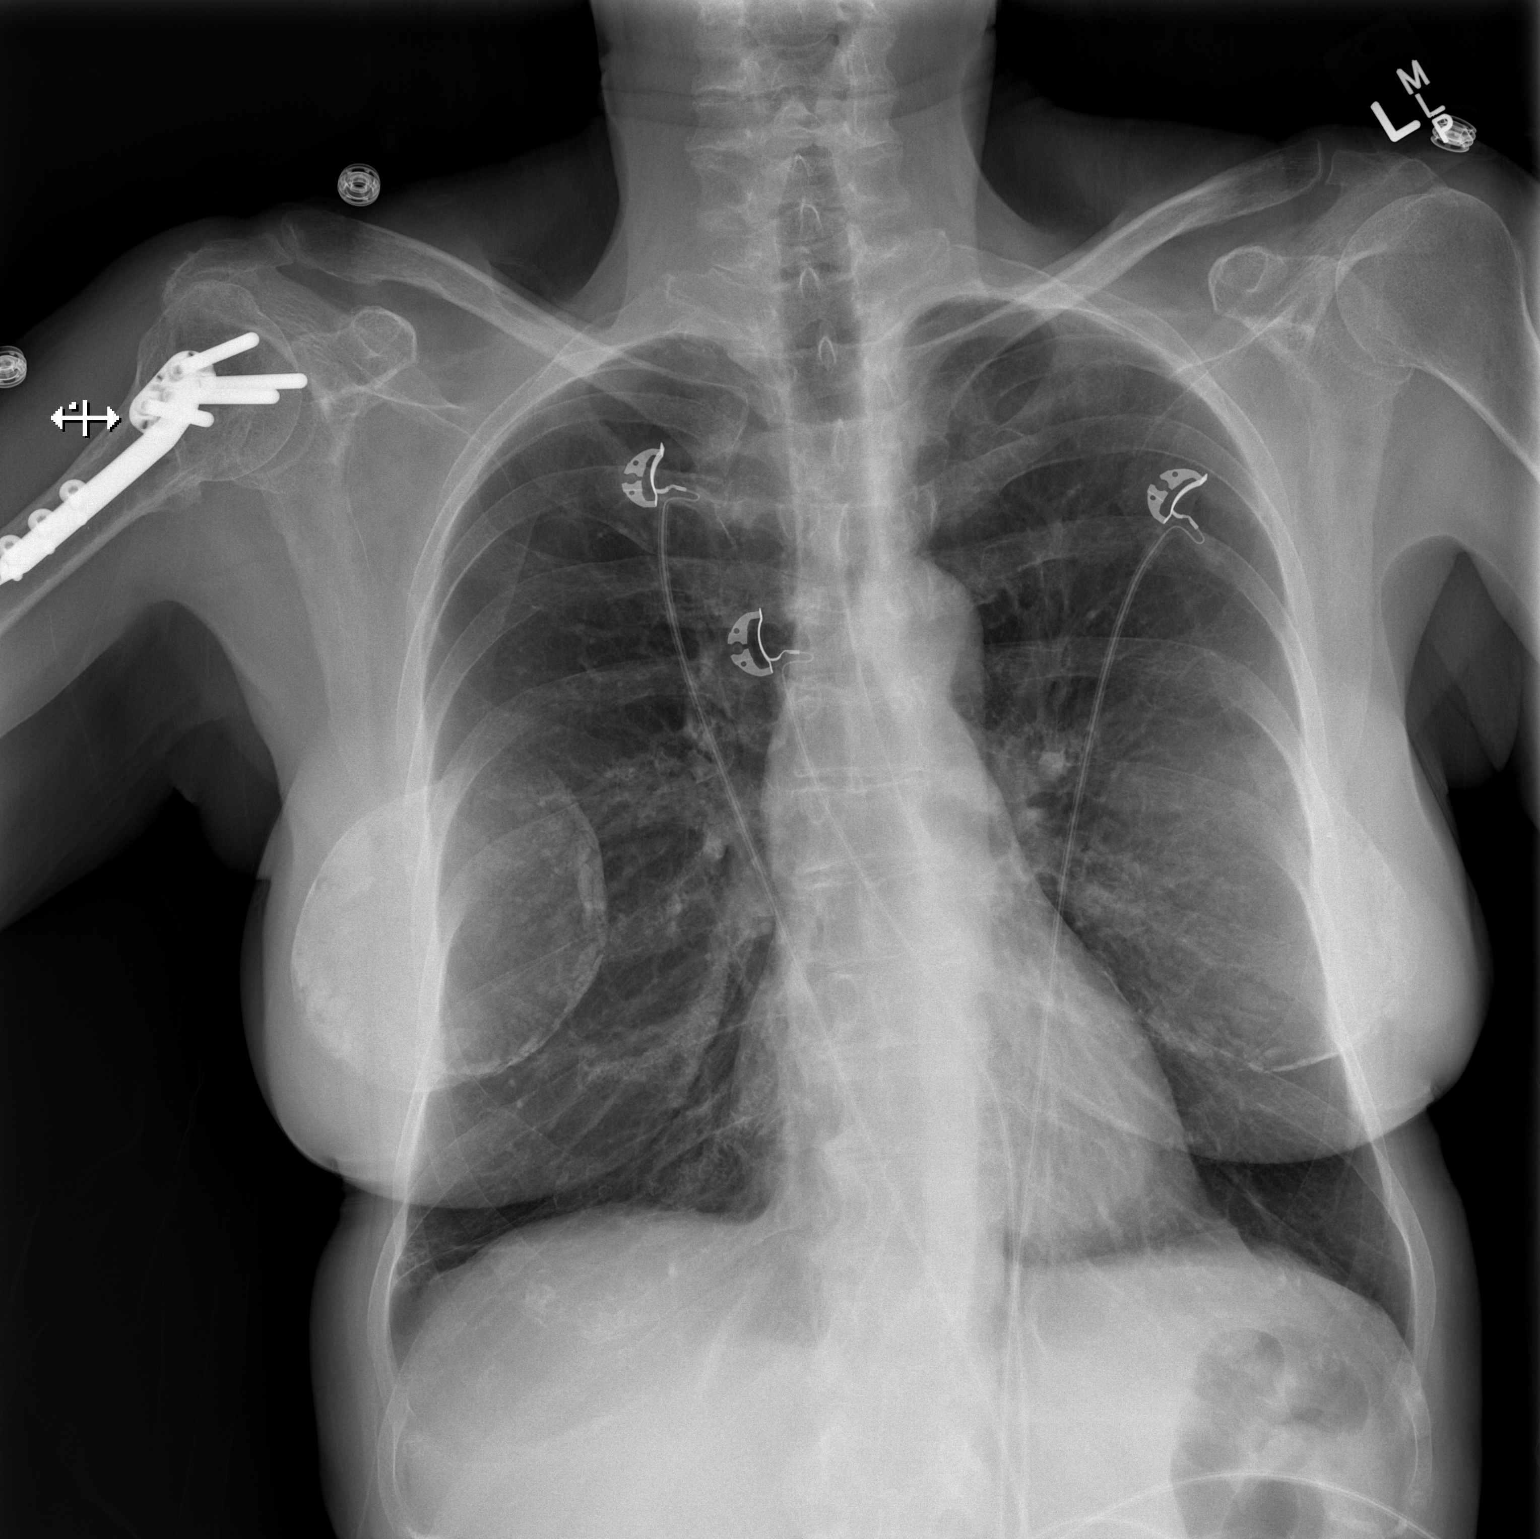

[2 of 2 positions shown; findings below may reference images not displayed]

FINDINGS: Cardiomediastinal silhouette is normal. No pleural effusions or
focal consolidations. Similarly pulmonary hyperinflation. Trachea
projects midline and there is no pneumothorax. Soft tissue planes
and included osseous structures are non-suspicious. RIGHT humerus
ORIF. Calcified breast implants.
IMPRESSION: Similar hyperinflation without acute cardiopulmonary process.

## 2018-12-25 IMAGING — CT CT HEAD W/O CM
3 of 4 series · 15 of 47 positions shown, 18 images · non-contrast
Comparison: 08/25/2016

CLINICAL DATA: Patient reports that she fell down some stairs 4
days ago and states she is not sure if she hit or head or not.
Patient states she had a hemorrhagic migraine that night and another
fall the next day. Today, patient states she has unsteady gait,
weakness, nausea, and dizziness today.

EXAM:
CT HEAD WITHOUT CONTRAST
TECHNIQUE: Contiguous axial images were obtained from the base of the skull
through the vertex without intravenous contrast.

[Series 2: head w/o · axial · non-contrast · 0.41mm/px · z∈[-100,+15]mm · 9 of 29 slices shown, 12 images]
[im 3/29  brain]
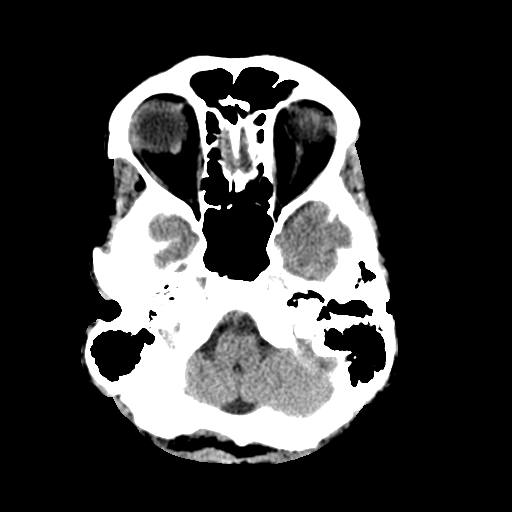
[im 3/29  bone]
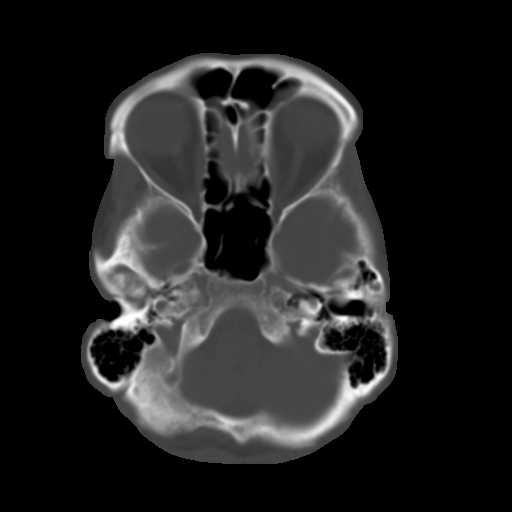
[im 6/29  brain]
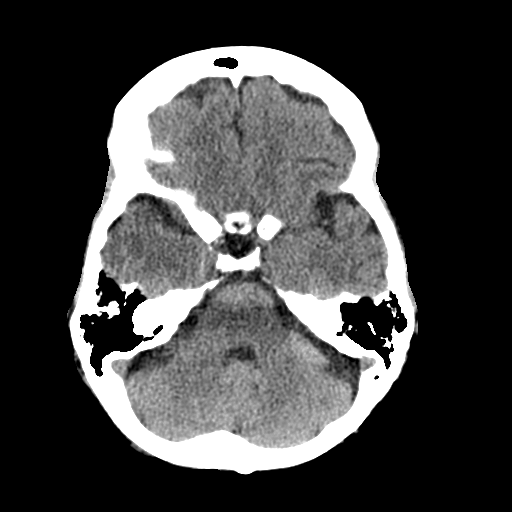
[im 9/29  brain]
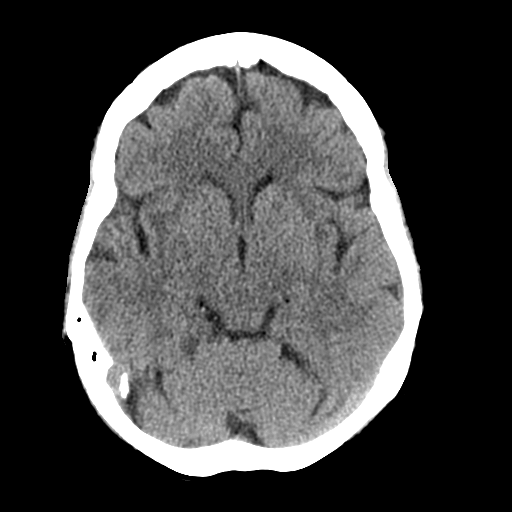
[im 12/29  brain]
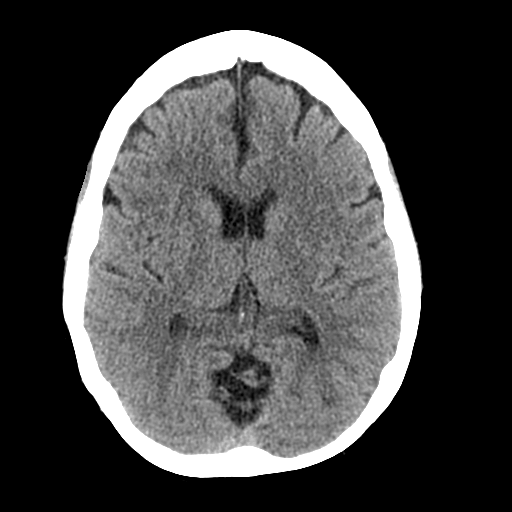
[im 15/29  brain]
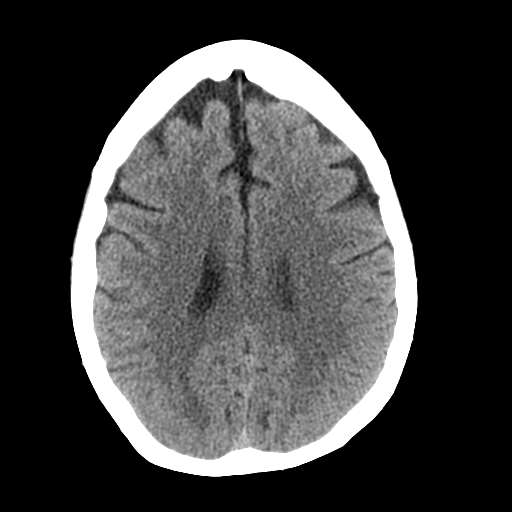
[im 15/29  bone]
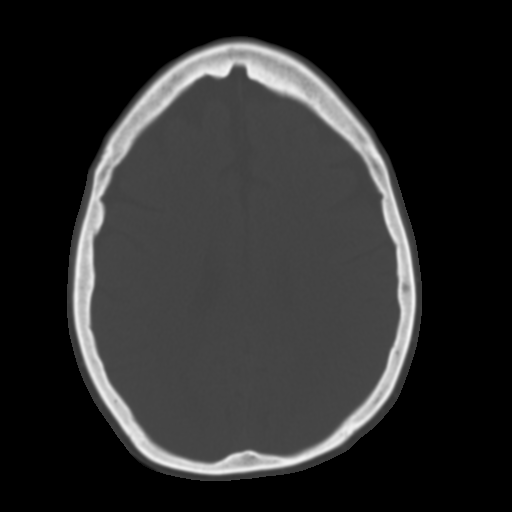
[im 17/29  brain]
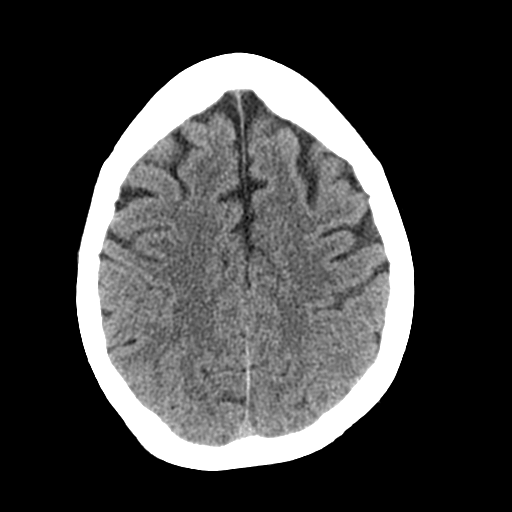
[im 20/29  brain]
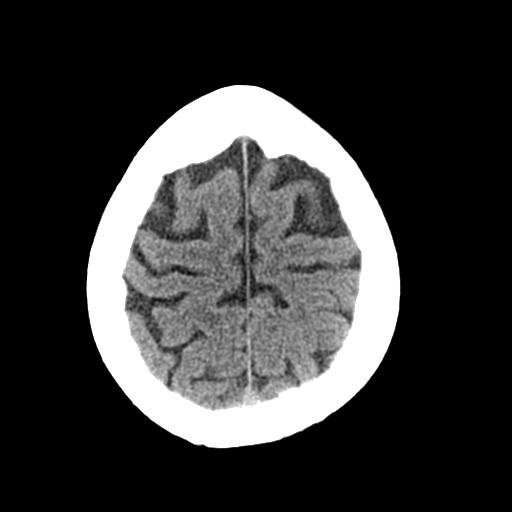
[im 23/29  brain]
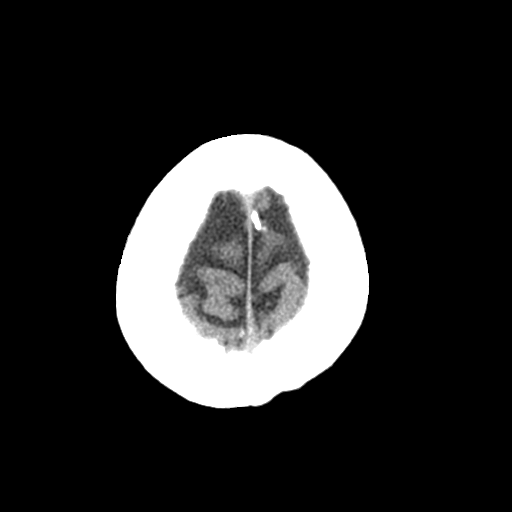
[im 26/29  brain]
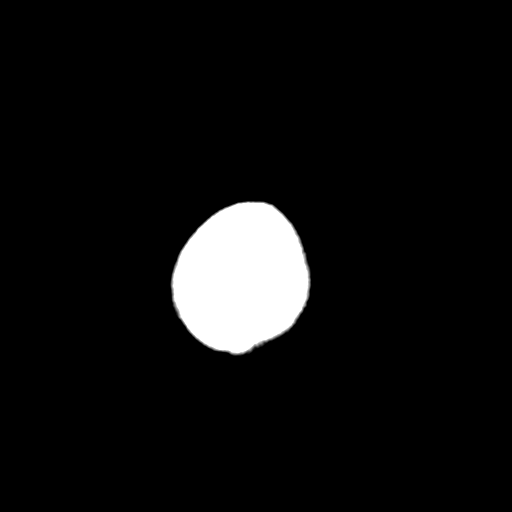
[im 26/29  bone]
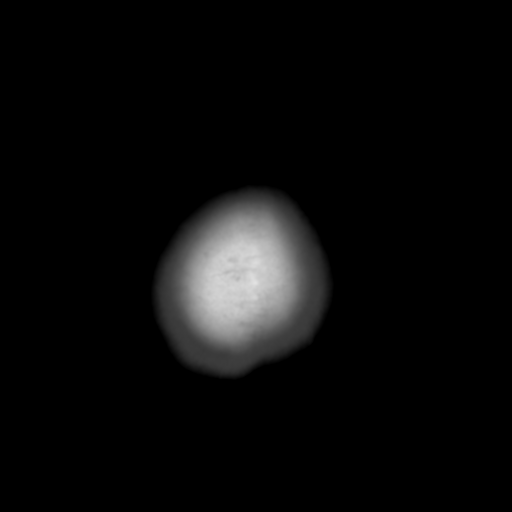

[Series 5: coronal · coronal · 0.27mm/px · 3 of 65 slices shown]
[im 22/65  brain]
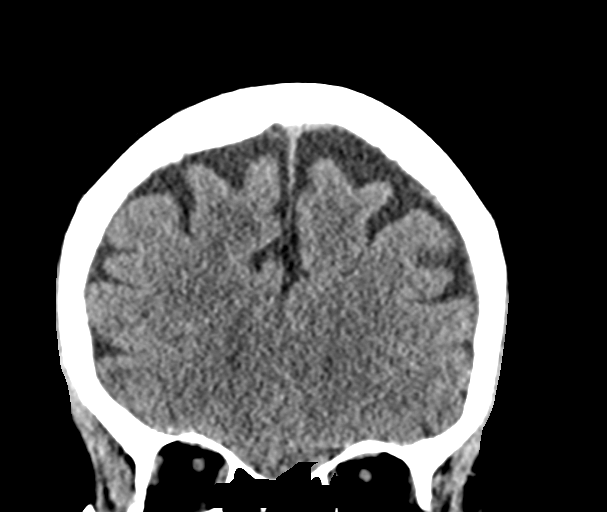
[im 29/65  brain]
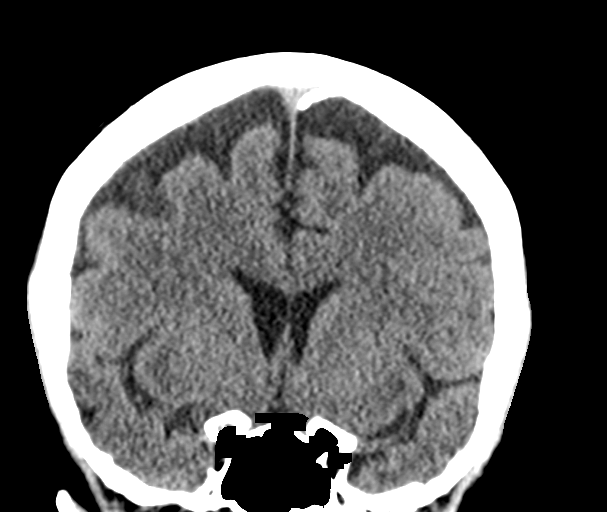
[im 36/65  brain]
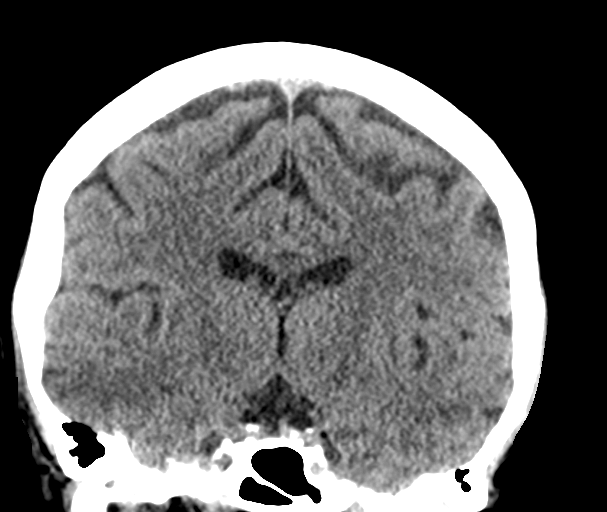

[Series 6: sagittal · sagittal · 0.27mm/px · 3 of 52 slices shown]
[im 18/52  brain]
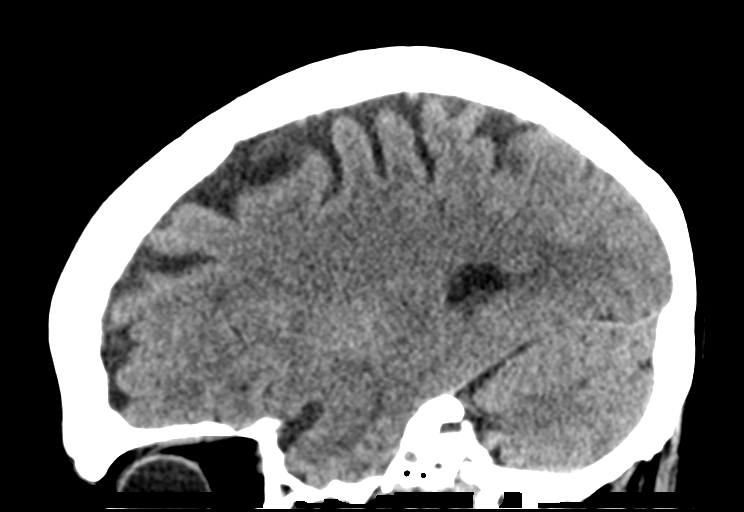
[im 26/52  brain]
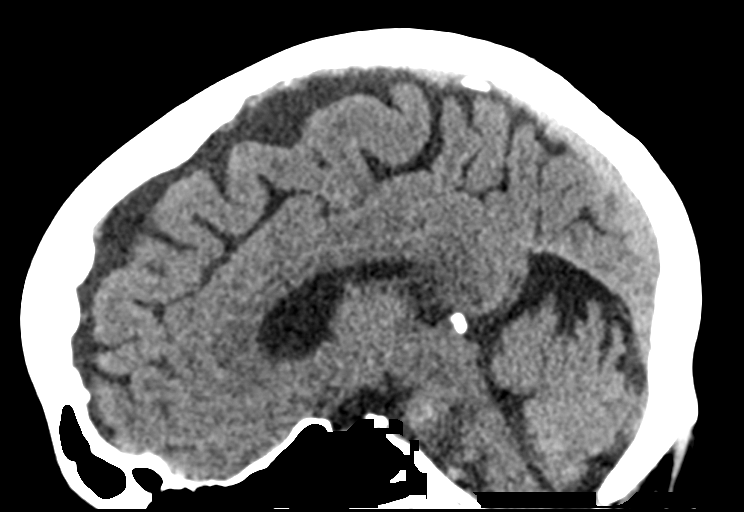
[im 35/52  brain]
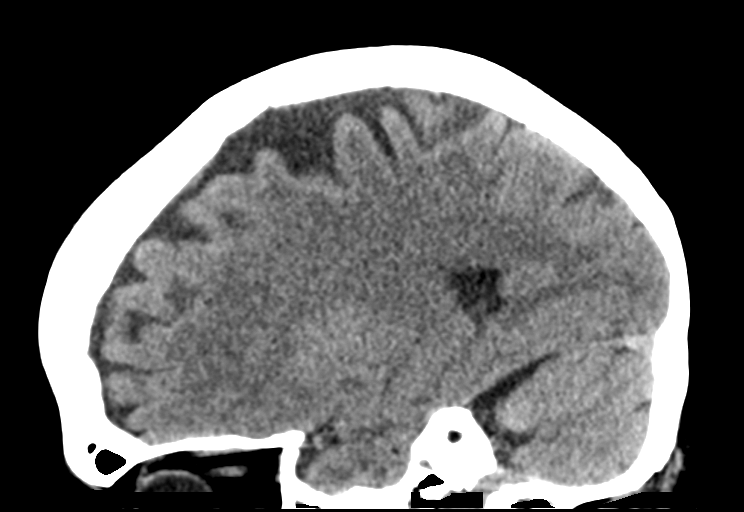

[15 of 47 positions shown; findings below may reference images not displayed]

FINDINGS: Brain: No evidence of acute infarction, hemorrhage, hydrocephalus,
extra-axial collection or mass lesion/mass effect.

Vascular: There is mild atherosclerotic calcification of the
internal carotid arteries.

Skull: Normal. Negative for fracture or focal lesion.

Sinuses/Orbits: No acute finding.

Other: None
IMPRESSION: No evidence for acute  abnormality.

## 2019-01-07 ENCOUNTER — Other Ambulatory Visit: Payer: Self-pay | Admitting: Family Medicine

## 2019-01-07 DIAGNOSIS — R1032 Left lower quadrant pain: Secondary | ICD-10-CM

## 2019-01-11 ENCOUNTER — Other Ambulatory Visit: Payer: Medicare Other

## 2019-01-11 DIAGNOSIS — M47816 Spondylosis without myelopathy or radiculopathy, lumbar region: Secondary | ICD-10-CM | POA: Insufficient documentation

## 2019-01-15 IMAGING — MR MR HEAD W/O CM
9 of 10 series · 35 of 48 positions shown · non-contrast
Comparison: Prior CT from earlier the same day. Comparison also
made with prior MRI from 12/20/2015

CLINICAL DATA: Initial evaluation for acute headache, right hemi
paresis.

EXAM:
MRI HEAD WITHOUT CONTRAST
TECHNIQUE: Multiplanar, multiecho pulse sequences of the brain and surrounding
structures were obtained without intravenous contrast.

[Series 2: FLAIR · sagittal · 5.0mm · 0.47mm/px · 1 of 23 slices shown (1 of 2)]
[im 1/23]
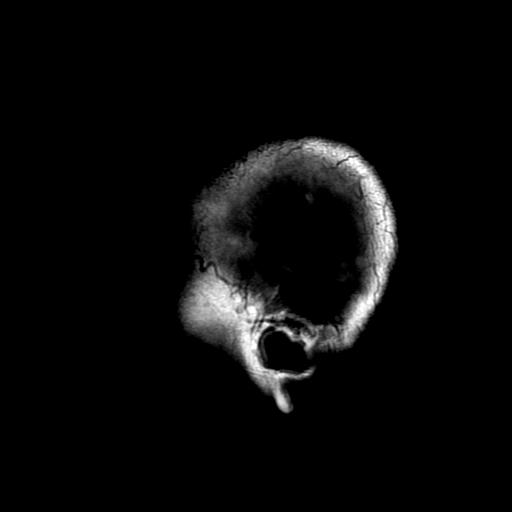

[Series 4: DWI · axial · 3.0mm · 0.94mm/px · z∈[-40,+105]mm · 8 of 100 slices shown (1 of 2)]
[im 1/100]
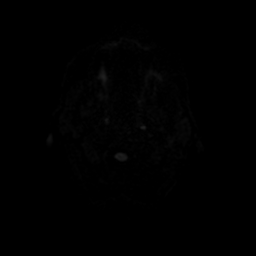
[im 12/100]
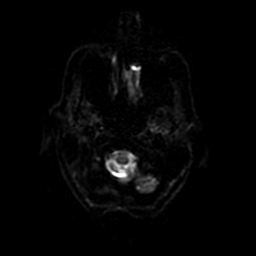
[im 34/100]
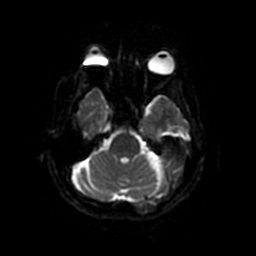
[im 45/100]
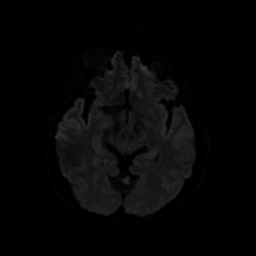
[im 56/100]
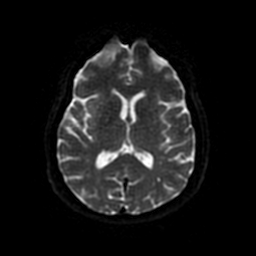
[im 67/100]
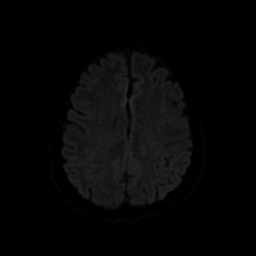
[im 89/100]
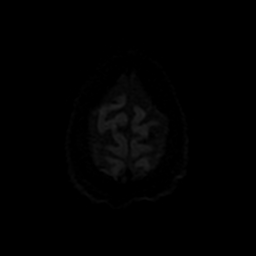
[im 100/100]
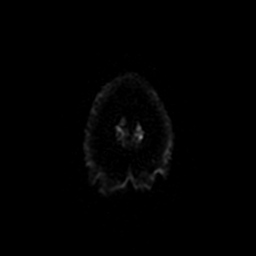

[Series 5: T2 · axial · 5.0mm · 0.43mm/px · z∈[-38,+104]mm · 2 of 25 slices shown (1 of 2)]
[im 1/25]
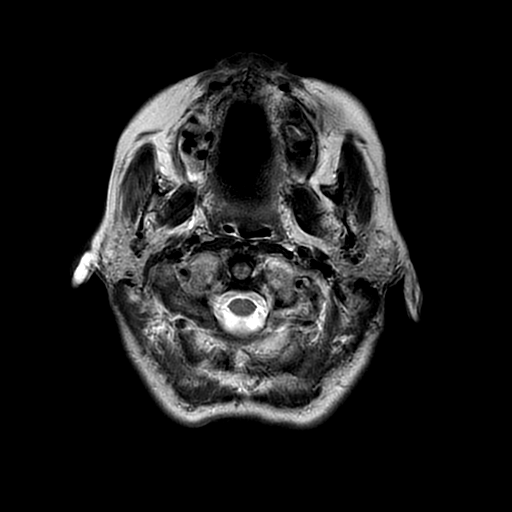
[im 25/25]
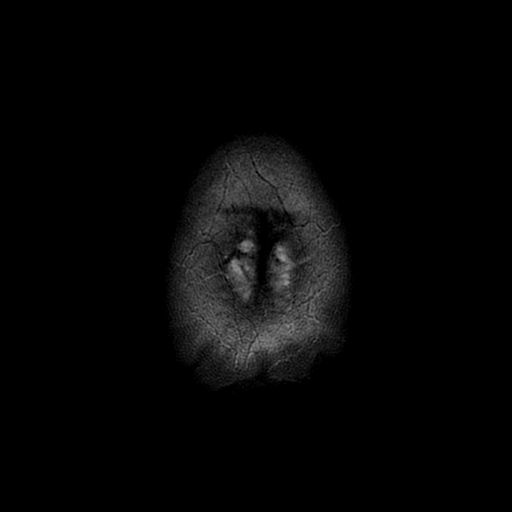

[Series 6: FLAIR · axial · 5.0mm · 0.43mm/px · z∈[-38,+104]mm · 2 of 25 slices shown (2 of 2)]
[im 1/25]
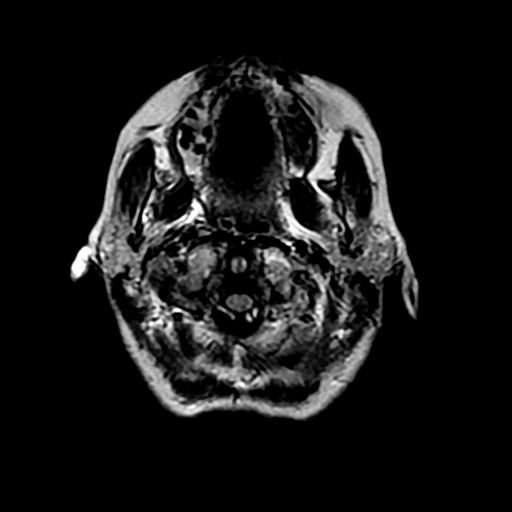
[im 25/25]
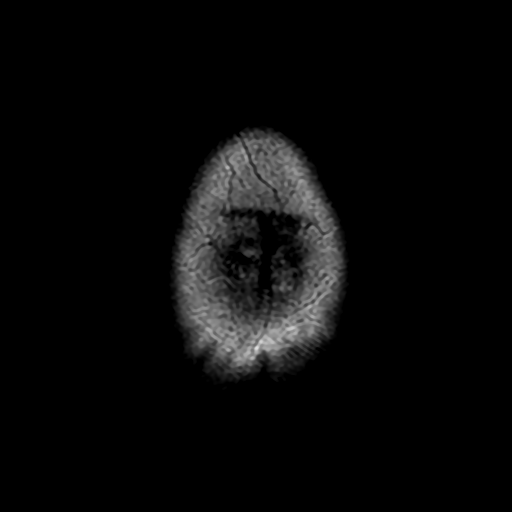

[Series 7: DWI · coronal · 4.0mm · 0.94mm/px · 7 of 70 slices shown (2 of 2)]
[im 1/70]
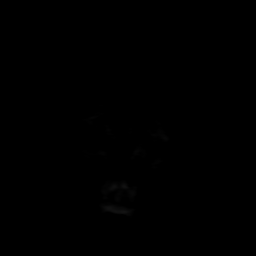
[im 12/70]
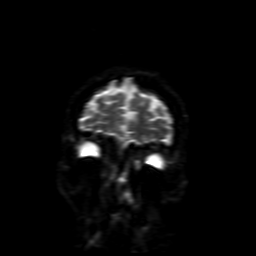
[im 24/70]
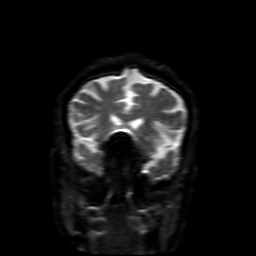
[im 35/70]
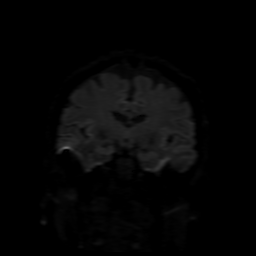
[im 47/70]
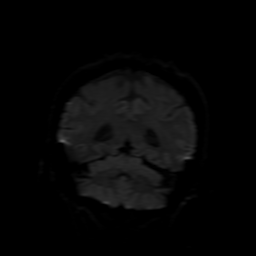
[im 58/70]
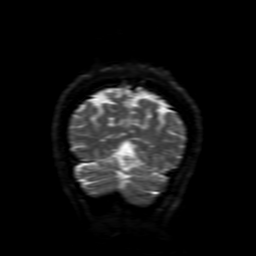
[im 70/70]
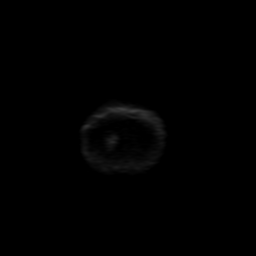

[Series 8: (person_name) · axial · 3.0mm · 0.47mm/px · z∈[-40,+25]mm · 4 of 100 slices shown]
[im 1/100]
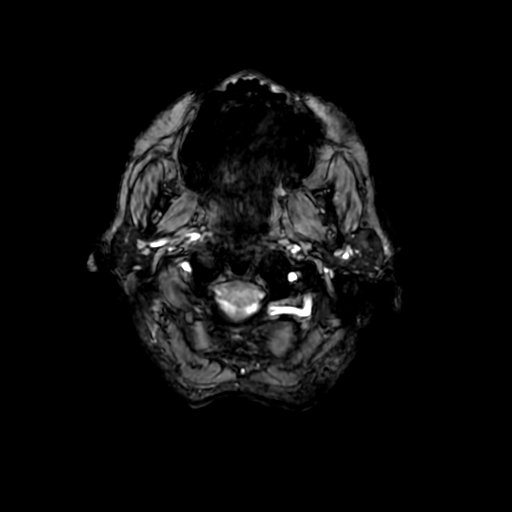
[im 12/100]
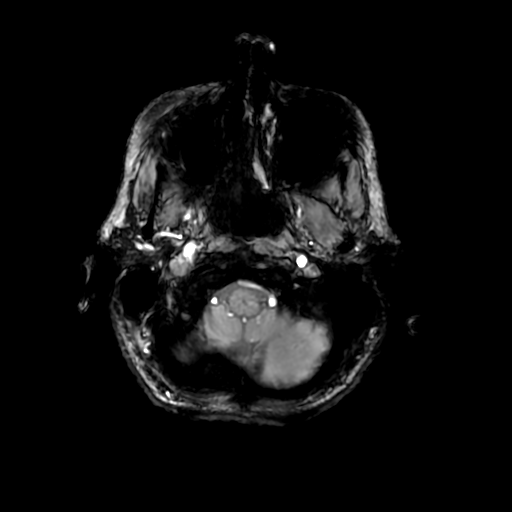
[im 34/100]
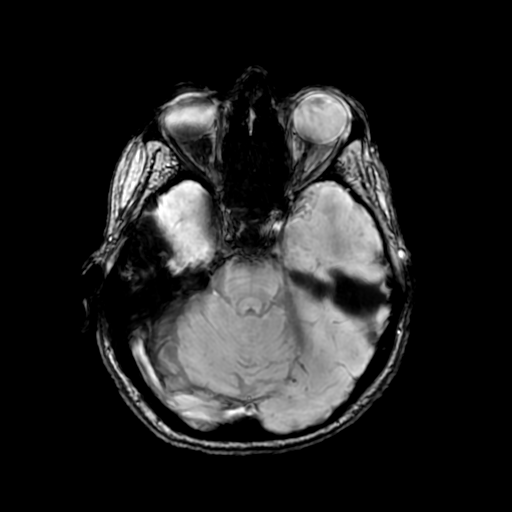
[im 45/100]
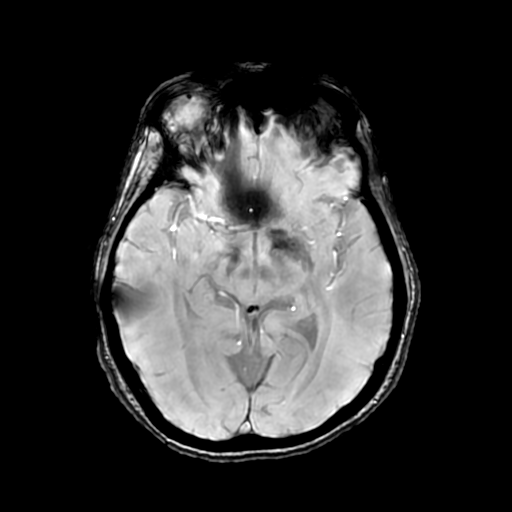

[Series 10: T2 · coronal · 5.0mm · 0.39mm/px · 3 of 29 slices shown (2 of 2)]
[im 1/29]
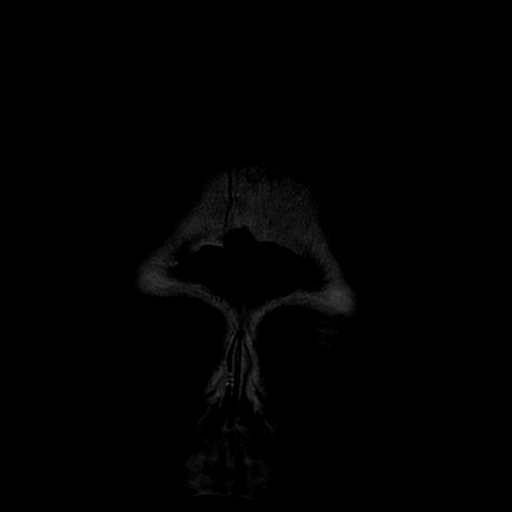
[im 15/29]
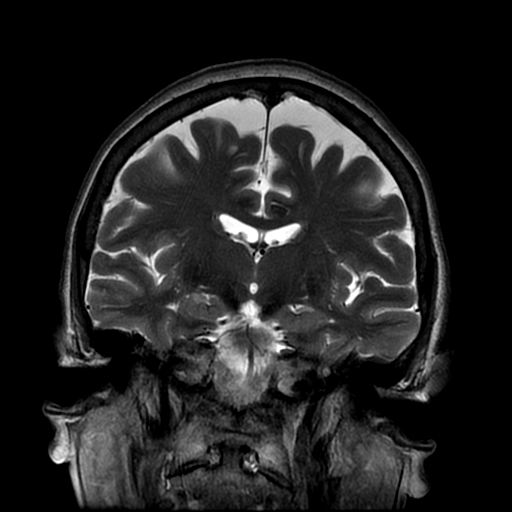
[im 29/29]
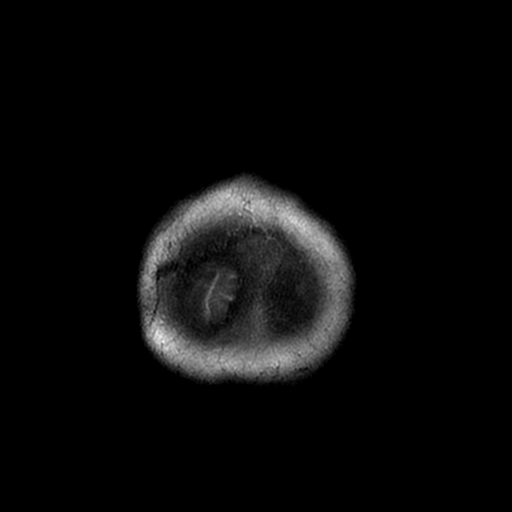

[Series 450: ADC · axial · 3.0mm · 0.94mm/px · z∈[-40,+105]mm · 5 of 50 slices shown (1 of 2)]
[im 1/50]
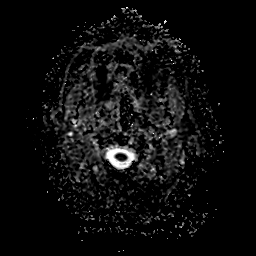
[im 13/50]
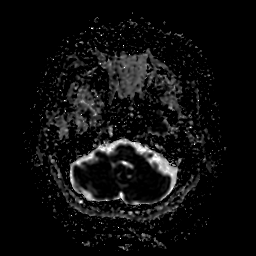
[im 25/50]
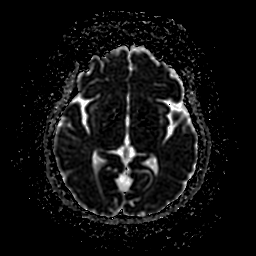
[im 37/50]
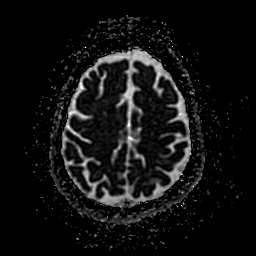
[im 50/50]
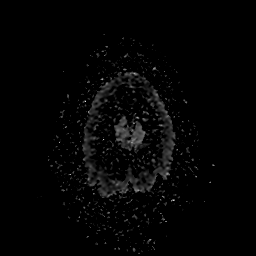

[Series 750: ADC · coronal · 4.0mm · 0.94mm/px · 3 of 35 slices shown (2 of 2)]
[im 1/35]
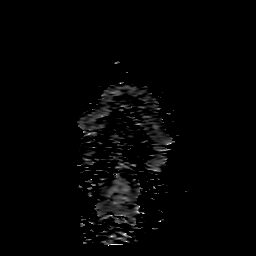
[im 18/35]
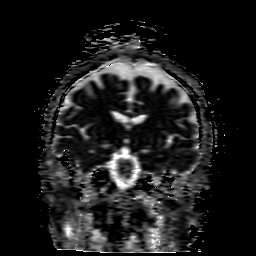
[im 35/35]
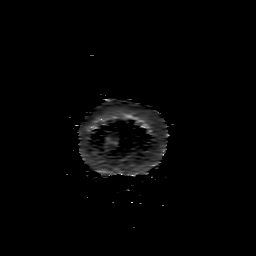

[35 of 48 positions shown; findings below may reference images not displayed]

FINDINGS: Brain: Age-appropriate cerebral atrophy. Unchanged mild cerebral
white matter disease, nonspecific.

No abnormal foci of restricted diffusion to suggest acute or
subacute ischemia. Gray-white matter differentiation maintained. No
areas of chronic infarction identified. No evidence for acute or
chronic intracranial hemorrhage.

No mass lesion, midline shift or mass effect. No hydrocephalus. No
extra-axial fluid collection. Major dural sinuses are grossly
patent.

Pituitary gland suprasellar region within normal limits. Midline
structures intact and normal.

Vascular: Major intracranial vascular flow voids are maintained.

Skull and upper cervical spine: Craniocervical junction within
normal limits. Visualized upper cervical spine unremarkable. Bone
marrow signal intensity within normal limits. No scalp soft tissue
abnormality.

Sinuses/Orbits: Globes and oval soft tissues within normal limits.
Scattered mucosal thickening within the paranasal sinuses. No
air-fluid level to suggest active sinus infection. No mastoid
effusion. Inner ear structures normal.

Other: None.
IMPRESSION: 1. No acute intracranial infarct or other process identified.
2. Unchanged mild cerebral white matter disease, nonspecific.

## 2019-01-15 IMAGING — CT CT HEAD W/O CM
4 series · 17 of 47 positions shown, 19 images · non-contrast
Comparison: 11/26/2016 CT head.

CLINICAL DATA: 65 y/o F; woke this morning with migraine and
right-sided weakness.

EXAM:
CT HEAD WITHOUT CONTRAST
TECHNIQUE: Contiguous axial images were obtained from the base of the skull
through the vertex without intravenous contrast.

[Series 3: head without · axial · non-contrast · 0.40mm/px · z∈[+1168,+1288]mm · 7 of 32 slices shown, 9 images]
[im 4/32  brain]
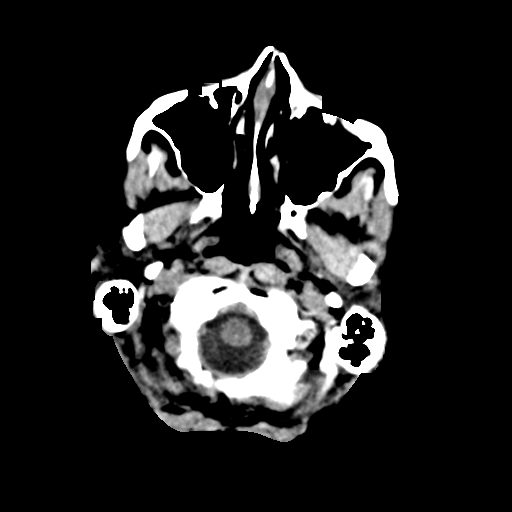
[im 4/32  bone]
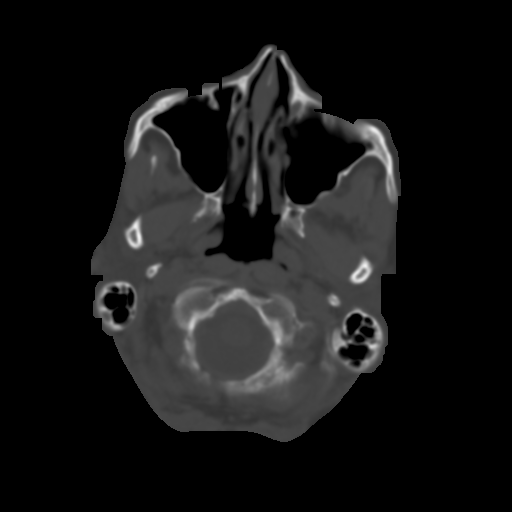
[im 8/32  brain]
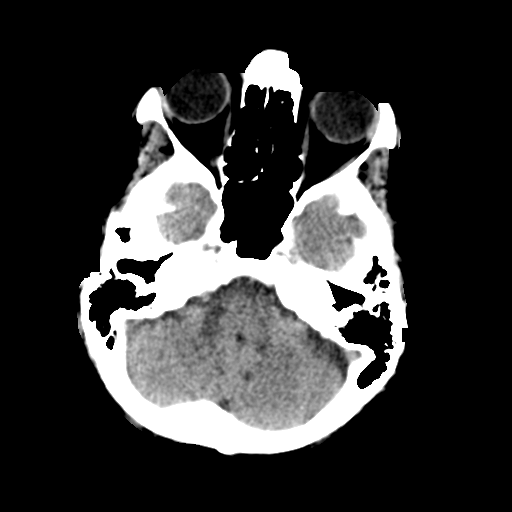
[im 12/32  brain]
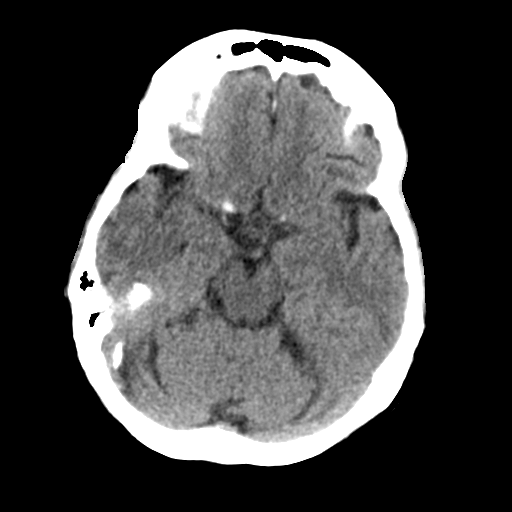
[im 16/32  brain]
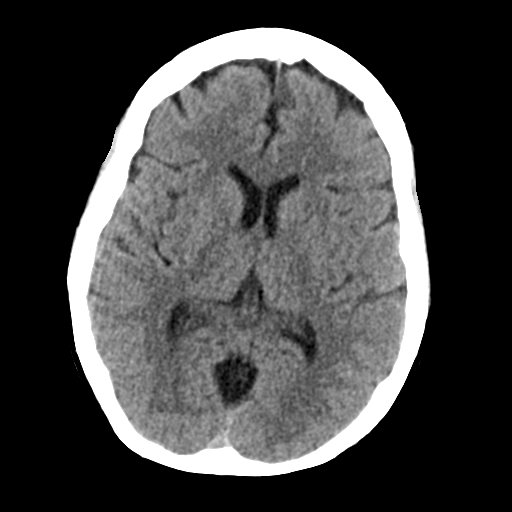
[im 20/32  brain]
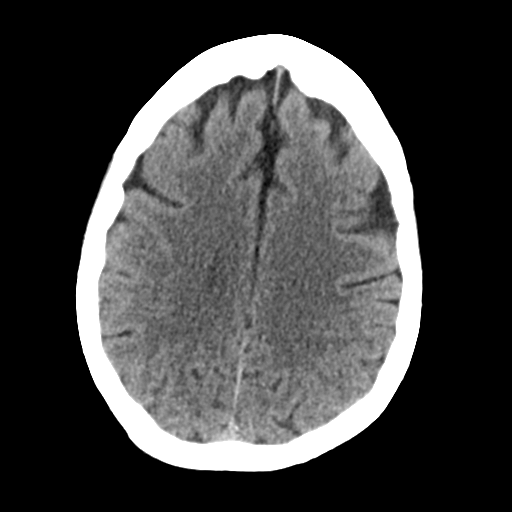
[im 20/32  bone]
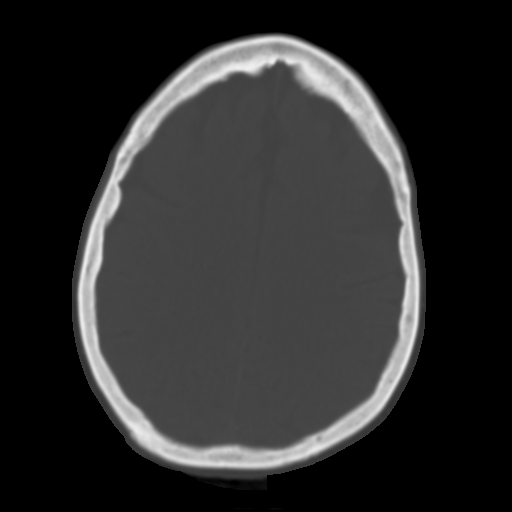
[im 24/32  brain]
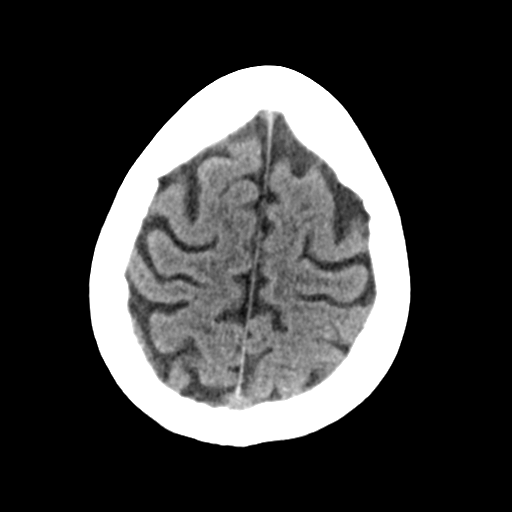
[im 28/32  brain]
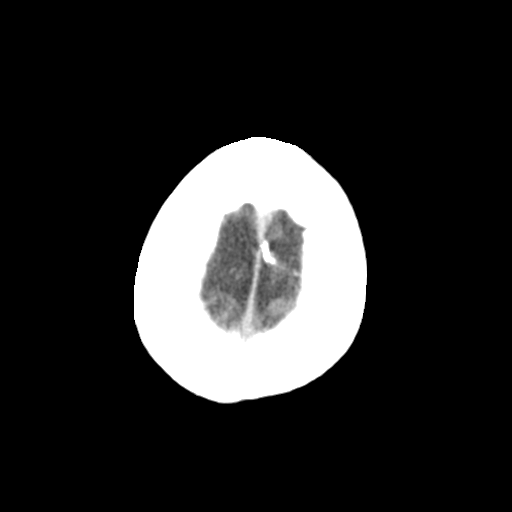

[Series 4: head bone · axial · 0.40mm/px · z∈[+1167,+1223]mm · 4 of 79 slices shown]
[im 8/79  bone]
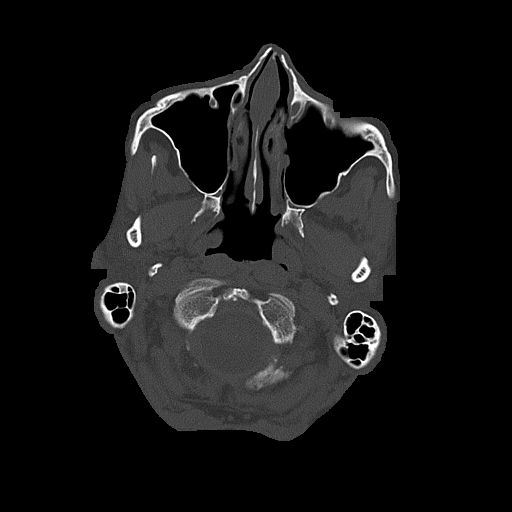
[im 16/79  bone]
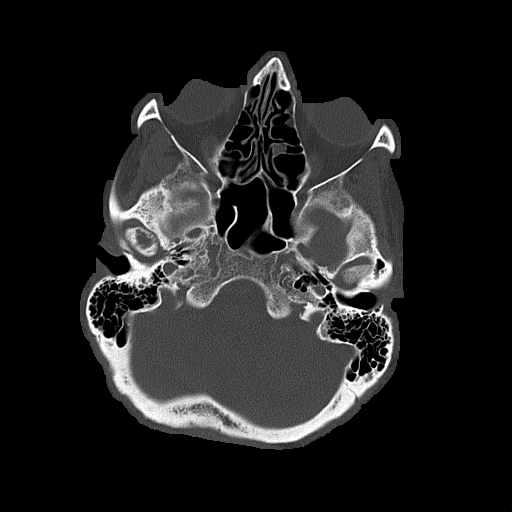
[im 24/79  bone]
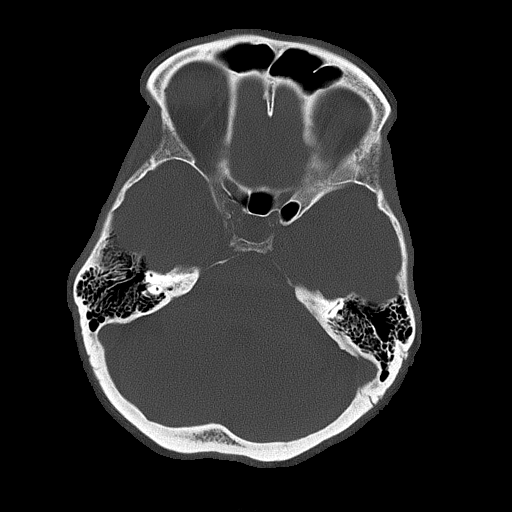
[im 36/79  bone]
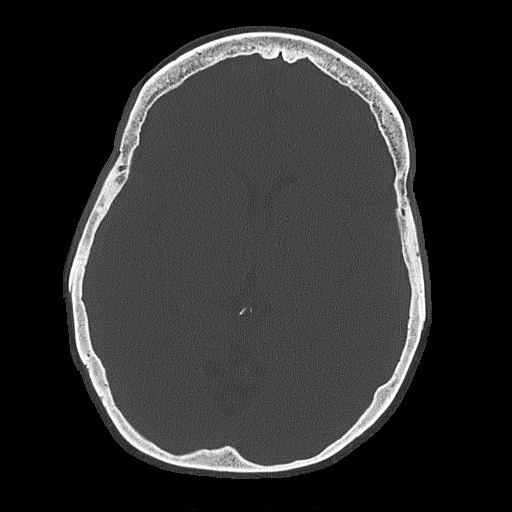

[Series 5: head without cor · coronal · non-contrast · 0.30mm/px · 3 of 66 slices shown]
[im 22/66  brain]
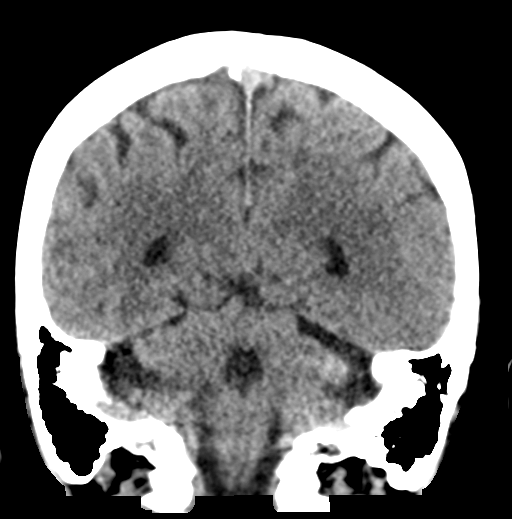
[im 29/66  brain]
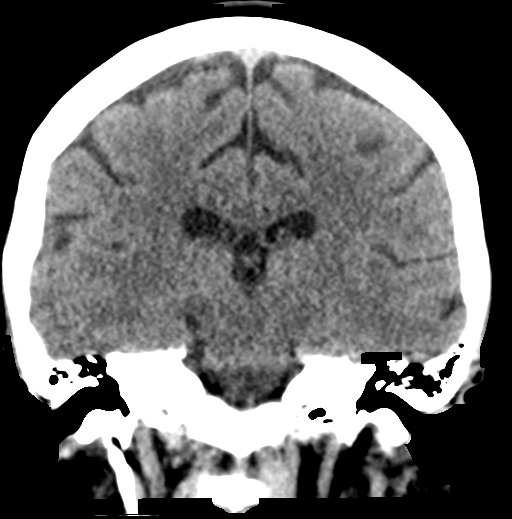
[im 37/66  brain]
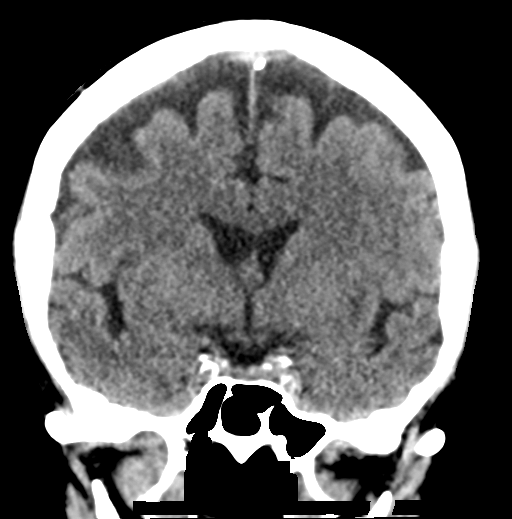

[Series 6: head without sag · sagittal · non-contrast · 0.30mm/px · 3 of 53 slices shown]
[im 18/53  brain]
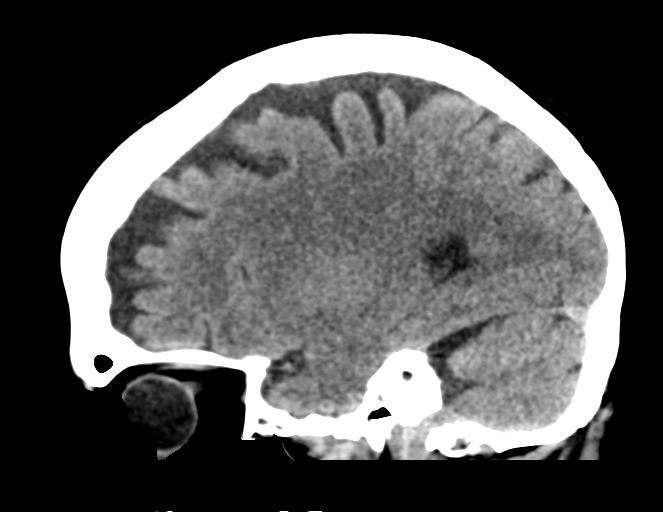
[im 27/53  brain]
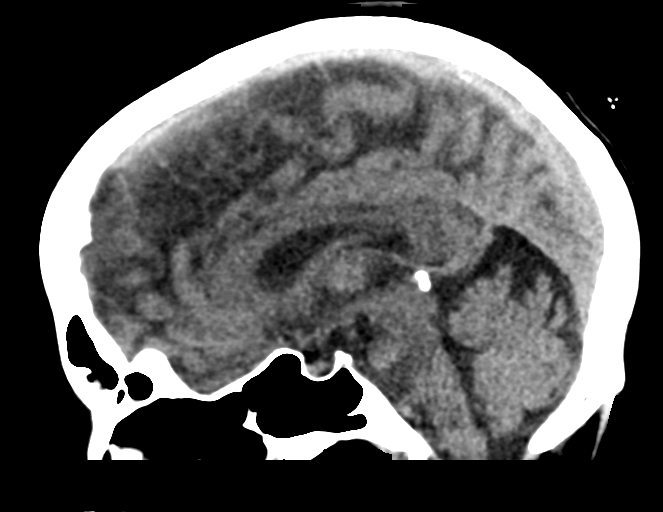
[im 35/53  brain]
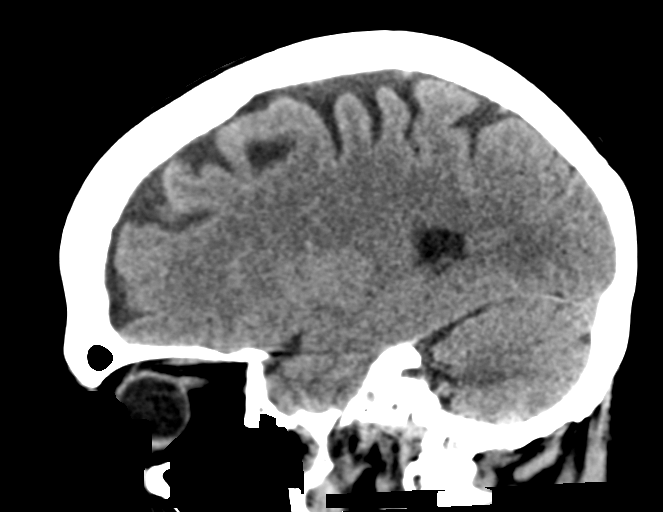

[17 of 47 positions shown; findings below may reference images not displayed]

FINDINGS: Brain: No evidence of acute infarction, hemorrhage, hydrocephalus,
extra-axial collection or mass lesion/mass effect.

Vascular: Mild calcific atherosclerosis of carotid siphons.

Skull: Normal. Negative for fracture or focal lesion.

Sinuses/Orbits: Small left maxillary sinus mucous retention cyst.
Otherwise negative.

Other: None.
IMPRESSION: No acute intracranial abnormality identified. Unremarkable CT of the
head.

By: Hanuaudri Garleman M.D.

## 2019-01-23 IMAGING — CR DG CHEST 2V
2 series · 2 of 2 positions shown · non-contrast
Comparison: PA and lateral chest 11/26/2016 and 09/26/2016. CT
chest 12/19/2014.

CLINICAL DATA: Chest and left shoulder pain with nausea today.

EXAM:
CHEST  2 VIEW

[w chest pa]
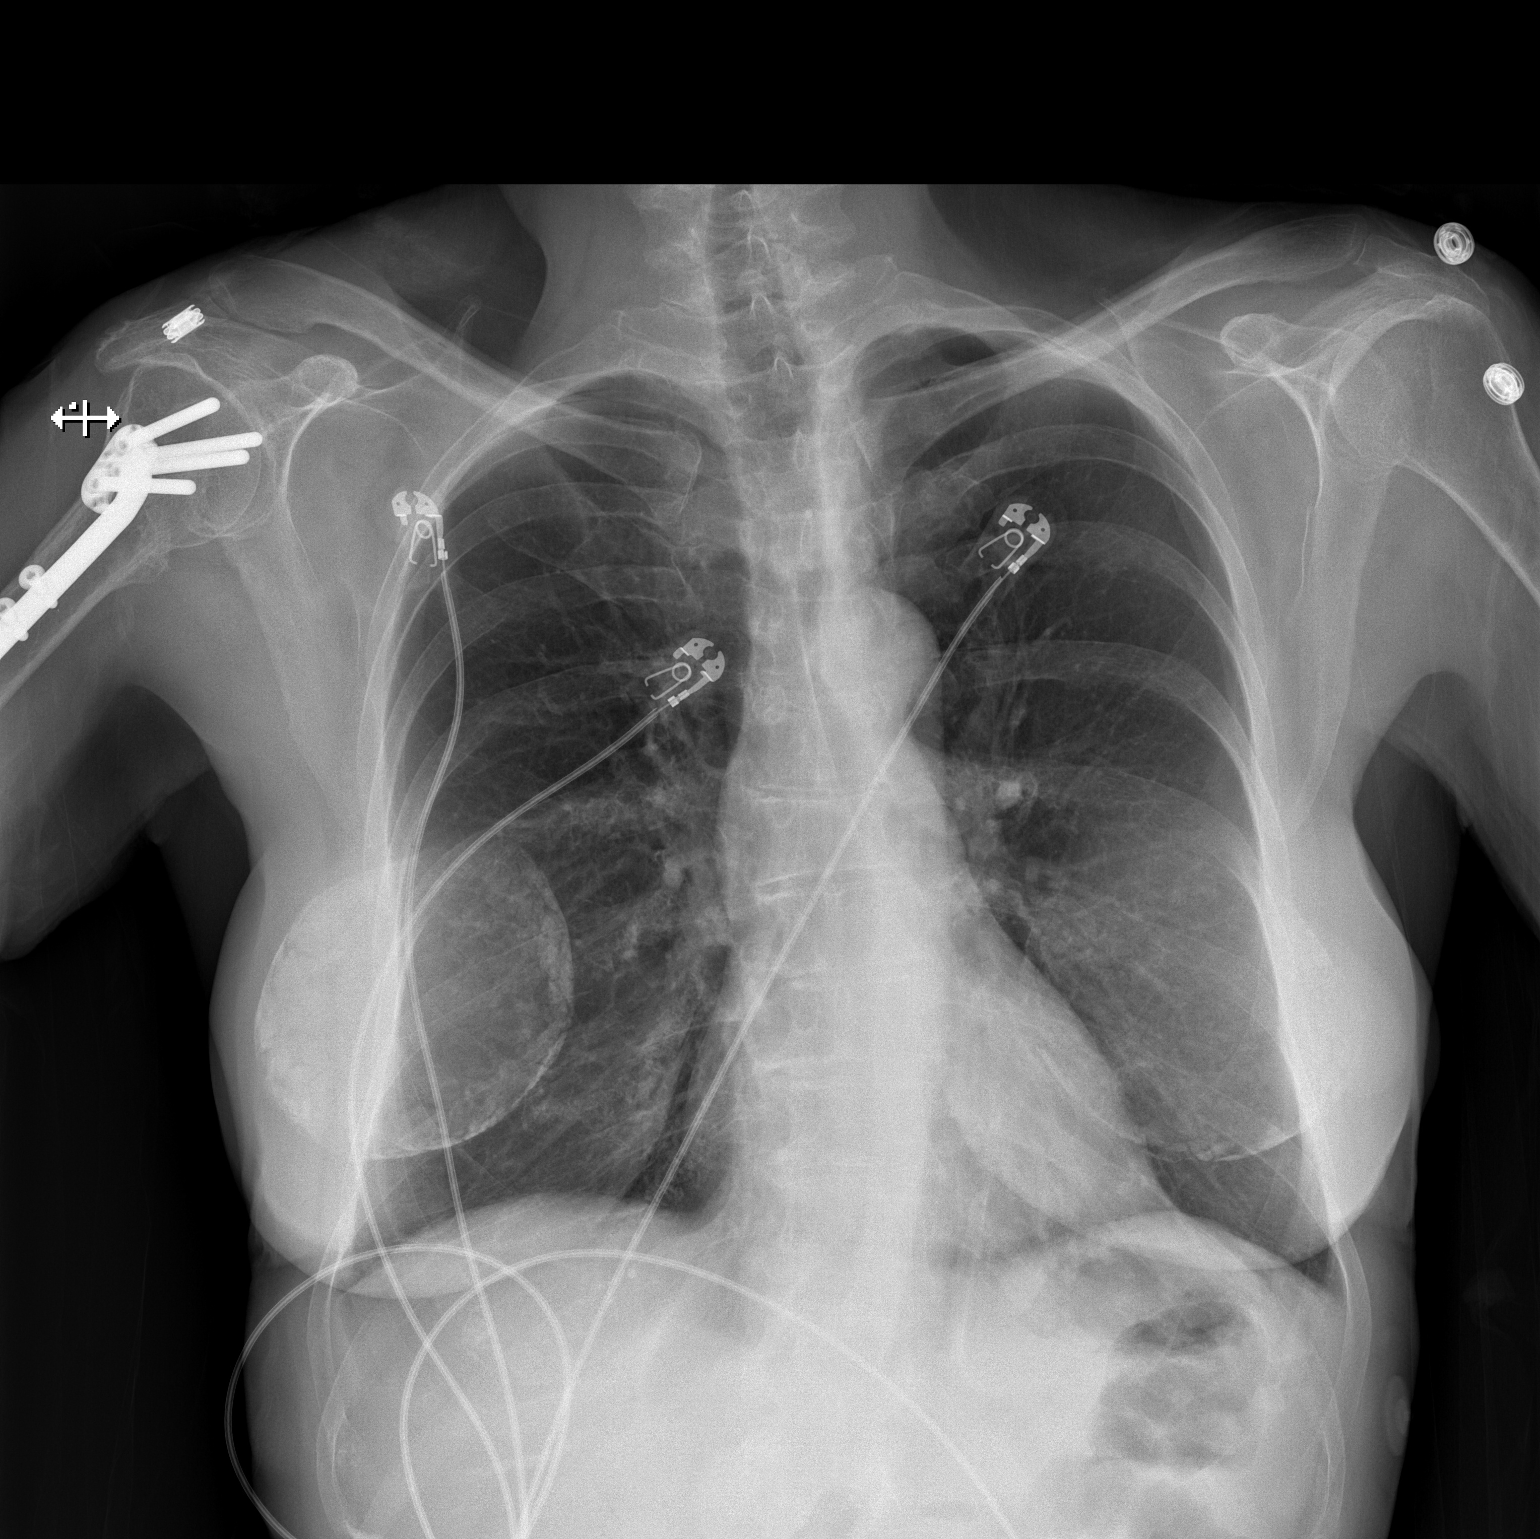

[w chest lat]
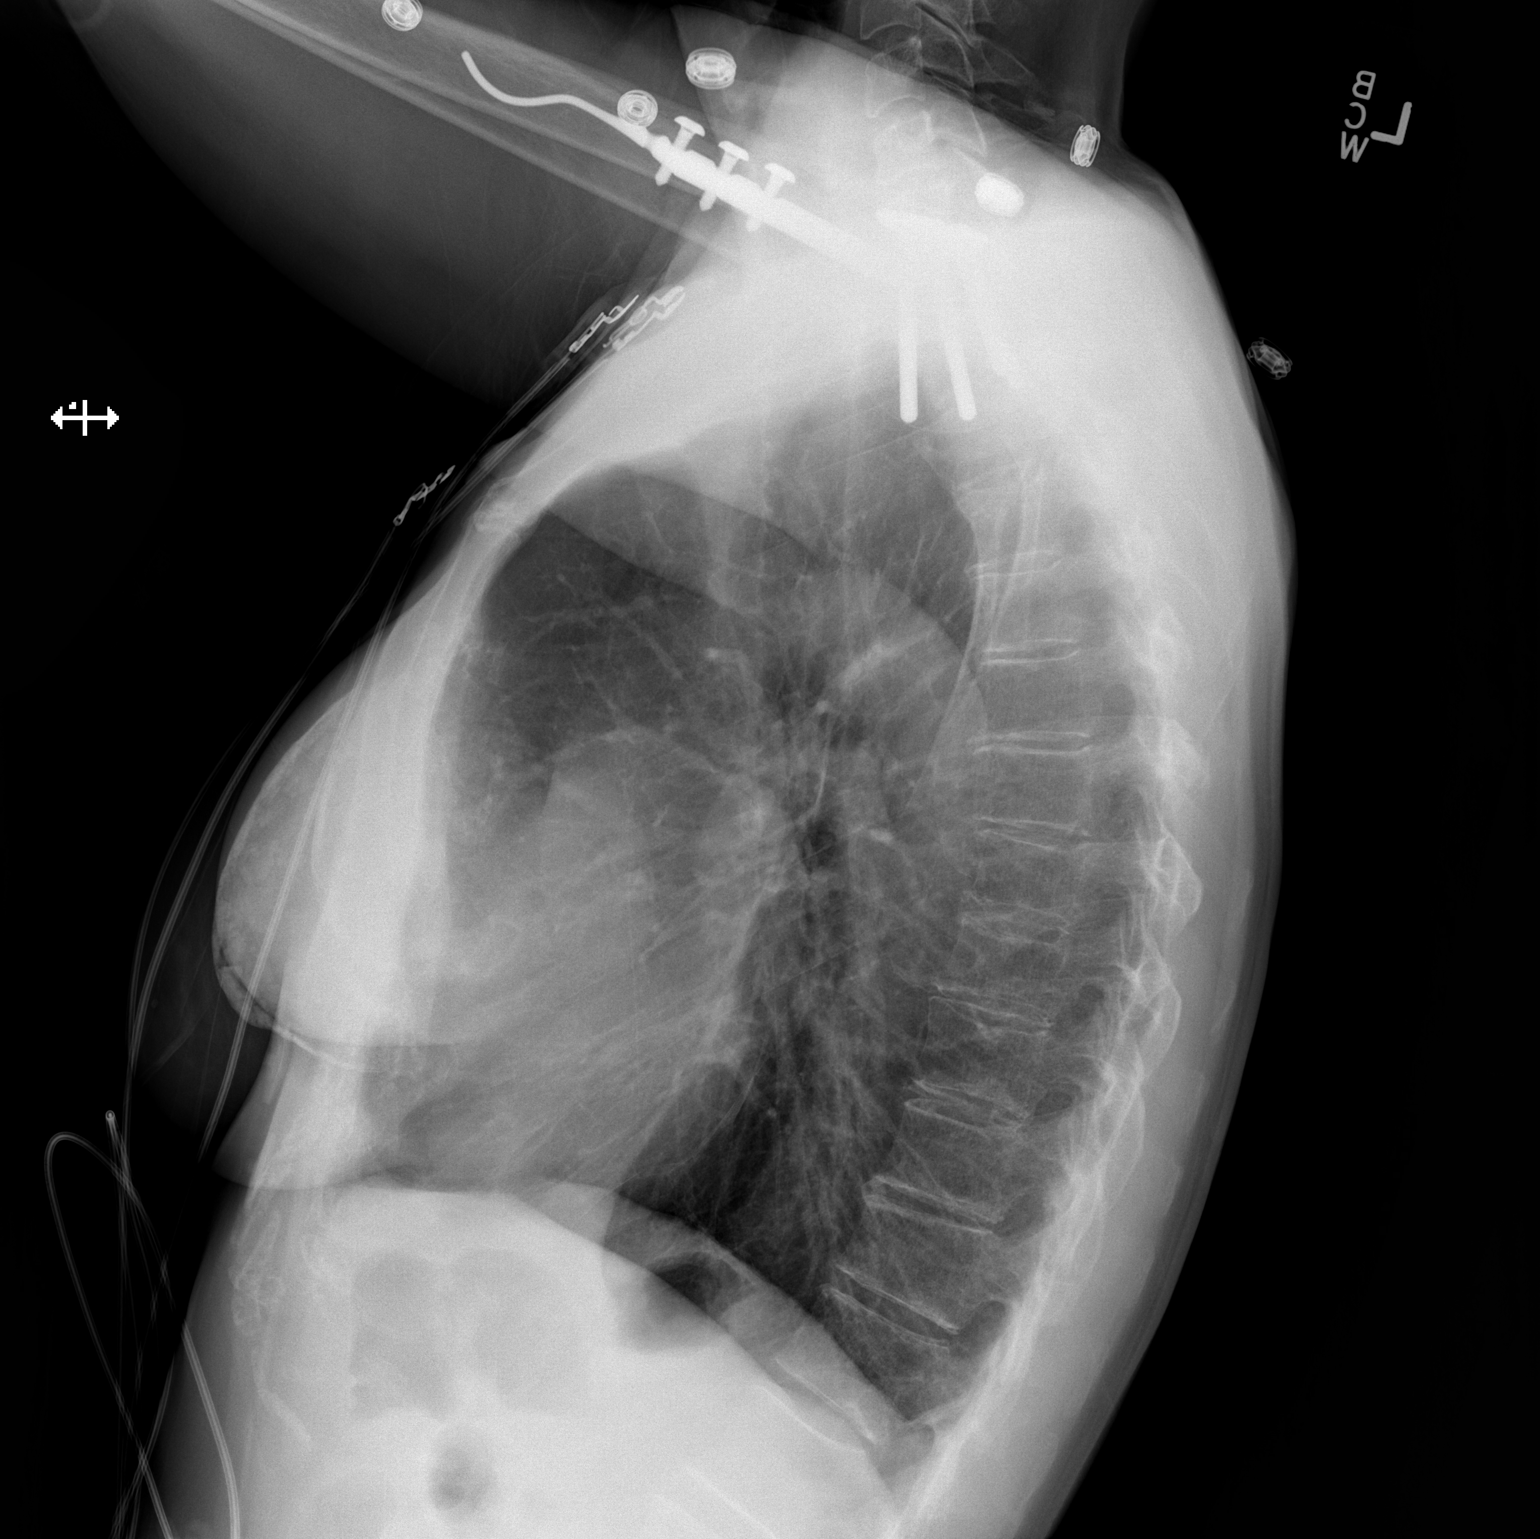

[2 of 2 positions shown; findings below may reference images not displayed]

FINDINGS: The lungs are clear. Heart size is normal. No pneumothorax or
pleural effusion. Calcified breast implants are identified. No focal
bony abnormality. Postoperative change of fracture fixation right
humerus noted.
IMPRESSION: No acute disease.

## 2019-01-24 ENCOUNTER — Other Ambulatory Visit: Payer: Medicare Other

## 2019-02-14 IMAGING — CT CT ABD-PELV W/ CM
2 of 5 series · 16 of 46 positions shown, 18 images · IV contrast (ISOVUE)
Comparison: CT of the abdomen pelvis dated 04/03/2016

CLINICAL DATA: 65-year-old female with abdominal pain and black
stools.

EXAM:
CT ABDOMEN AND PELVIS WITH CONTRAST
TECHNIQUE: Multidetector CT imaging of the abdomen and pelvis was performed
using the standard protocol following bolus administration of
intravenous contrast.
CONTRAST:  100mL CJIZGV-FWW IOPAMIDOL (CJIZGV-FWW) INJECTION 61%

[Series 2: abd/pel with · axial · 0.74mm/px · z∈[-518,-138]mm · 13 of 88 slices shown, 15 images]
[im 6/88  soft-tissue]
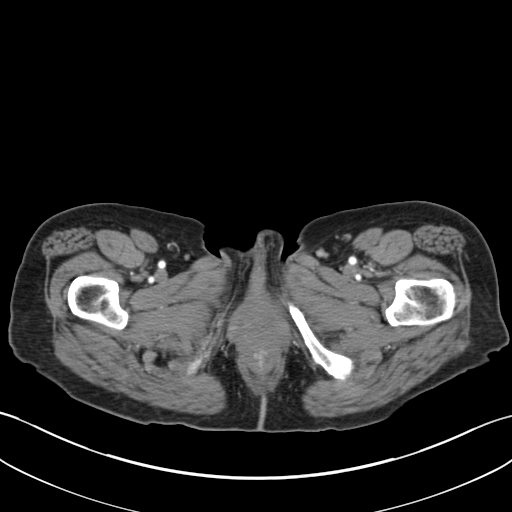
[im 6/88  bone]
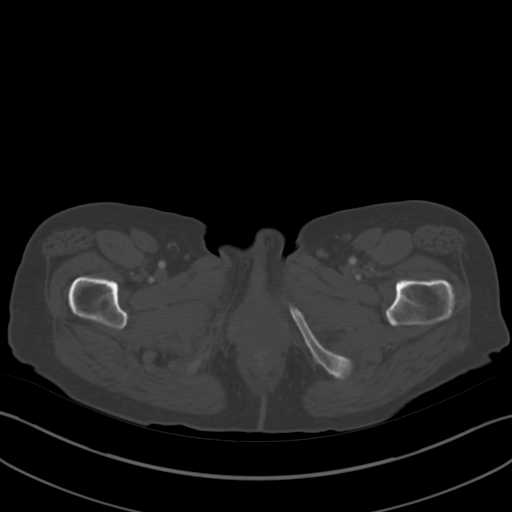
[im 11/88  soft-tissue]
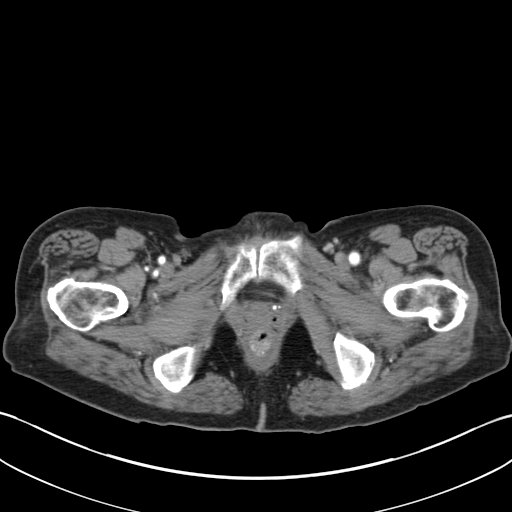
[im 17/88  soft-tissue]
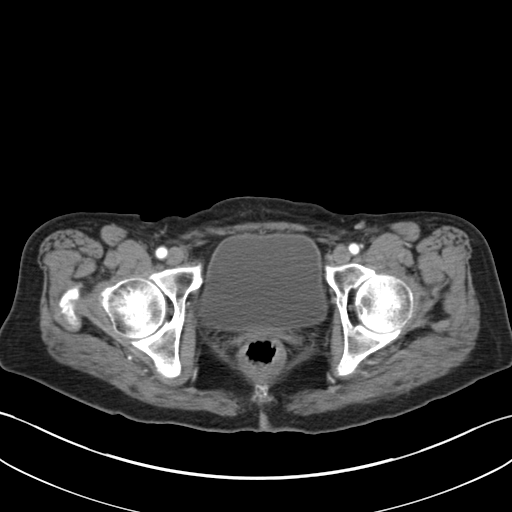
[im 28/88  soft-tissue]
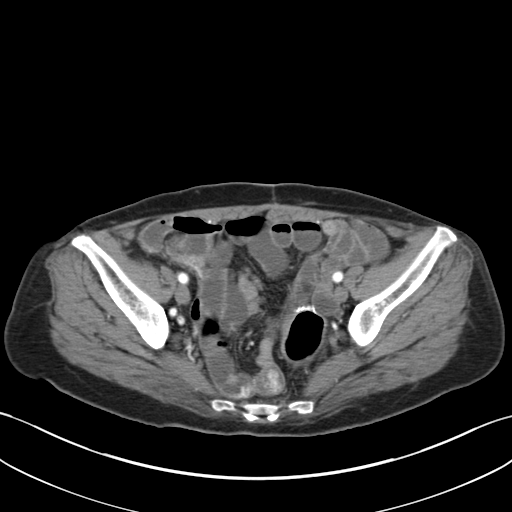
[im 33/88  soft-tissue]
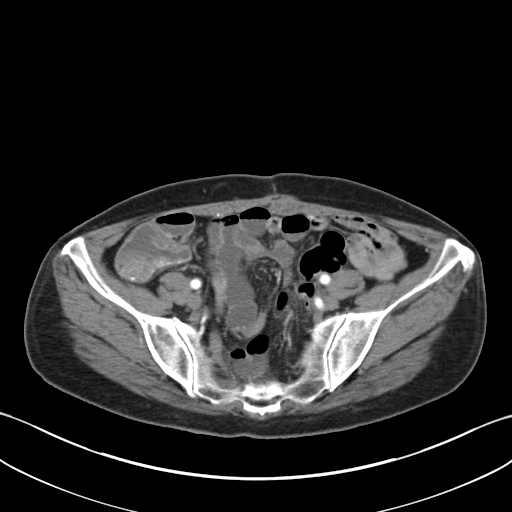
[im 39/88  soft-tissue]
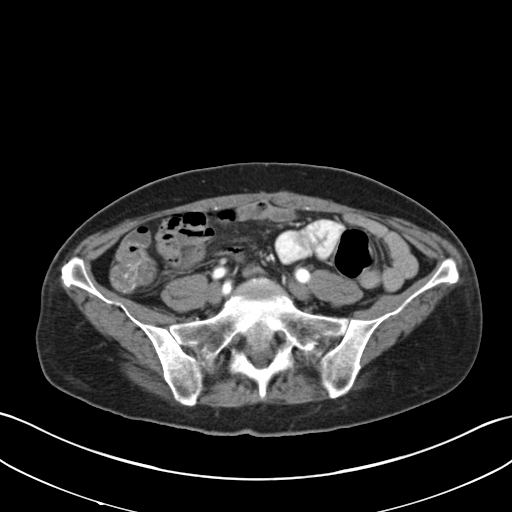
[im 44/88  soft-tissue]
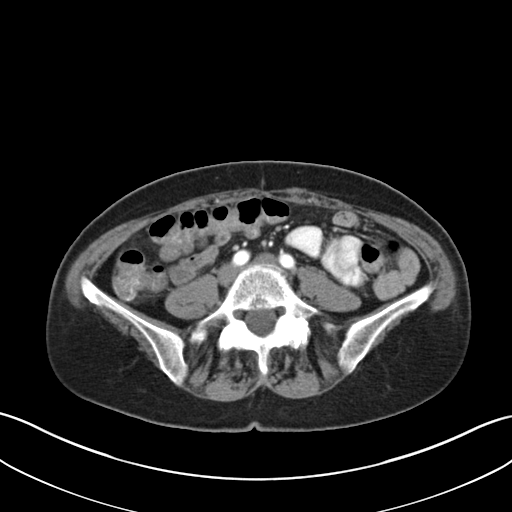
[im 49/88  soft-tissue]
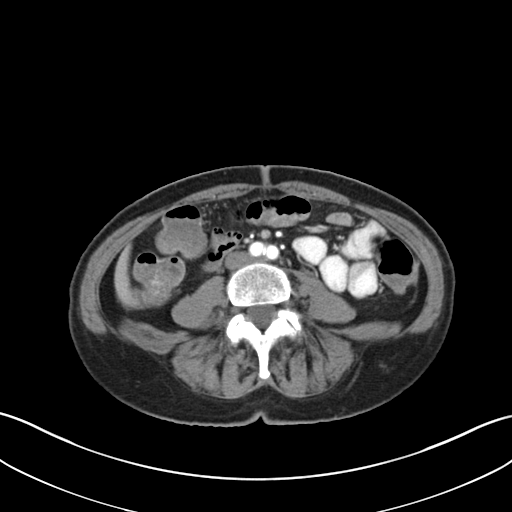
[im 55/88  soft-tissue]
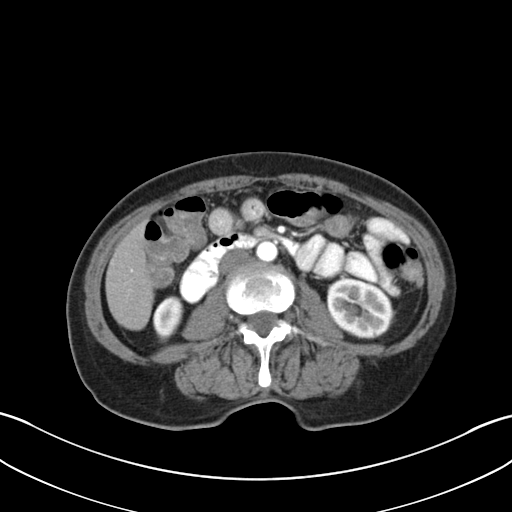
[im 55/88  bone]
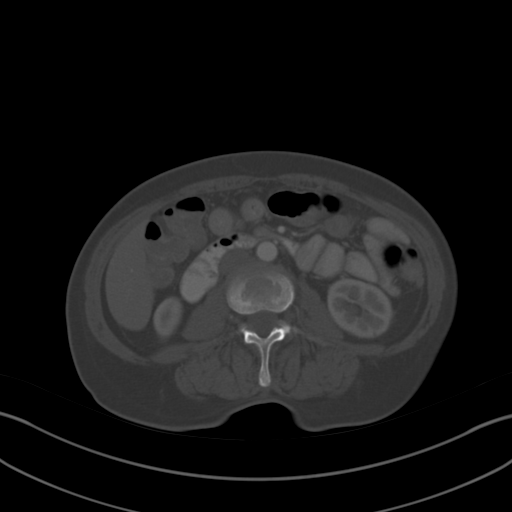
[im 60/88  soft-tissue]
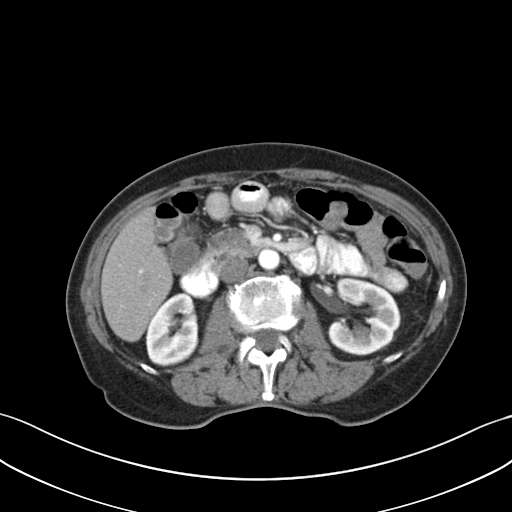
[im 71/88  soft-tissue]
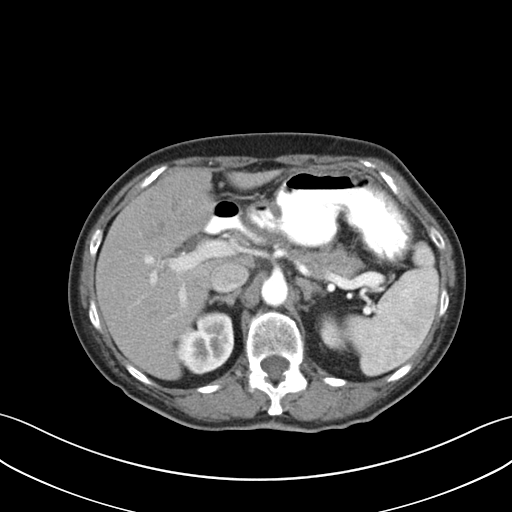
[im 77/88  soft-tissue]
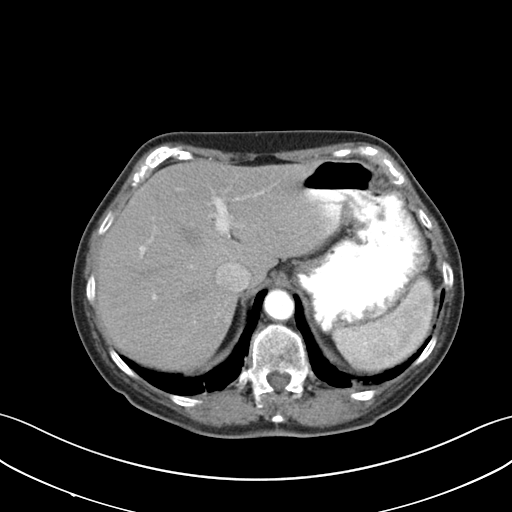
[im 82/88  soft-tissue]
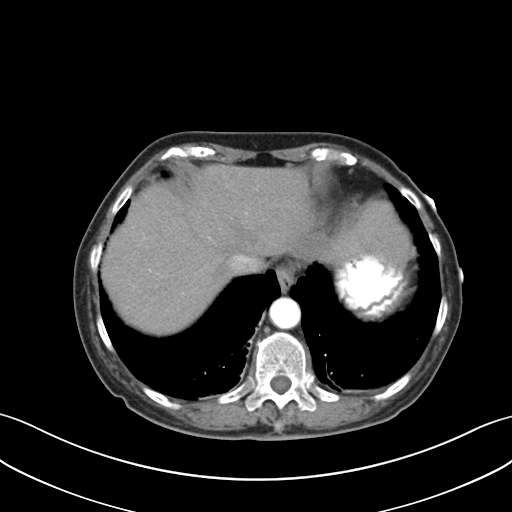

[Series 5: coronal a/|p · coronal · 0.78mm/px · 3 of 116 slices shown]
[im 39/116  soft-tissue]
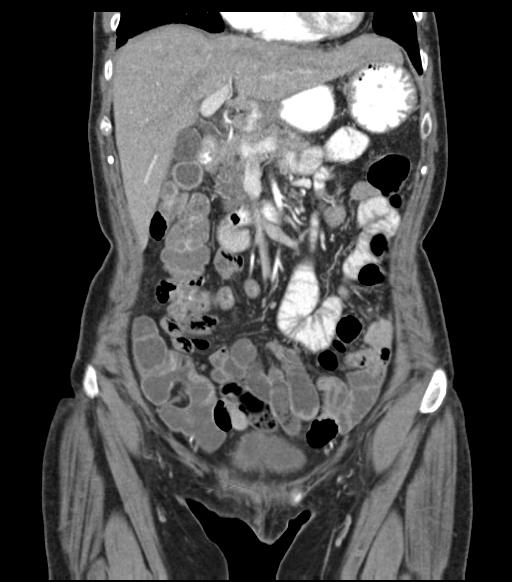
[im 52/116  soft-tissue]
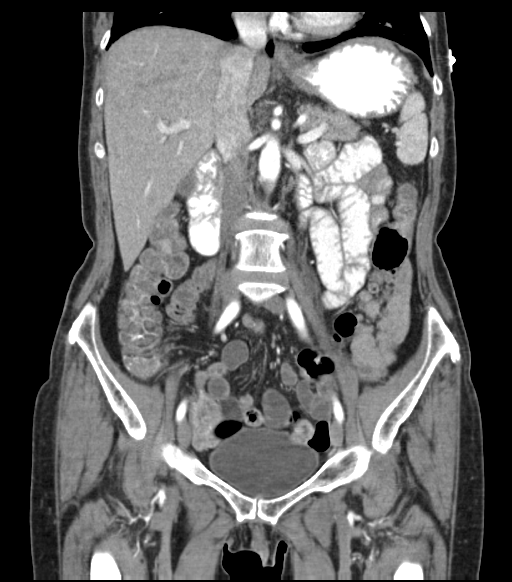
[im 64/116  soft-tissue]
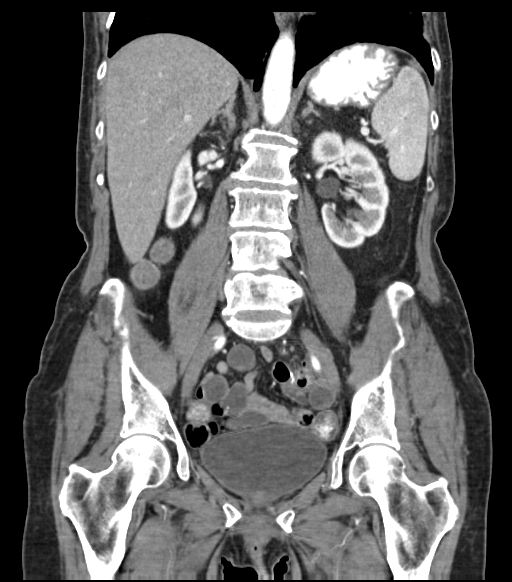

[16 of 46 positions shown; findings below may reference images not displayed]

FINDINGS: Lower chest: There are bibasilar linear atelectasis/ scarring. The
visualized lung bases are otherwise clear.

No intra-abdominal free air or free fluid.

Hepatobiliary: There is apparent mild fatty infiltration of the
liver. No intrahepatic biliary ductal dilatation. The gallbladder is
unremarkable.

Pancreas: There is mild dilatation of the main pancreatic duct
measuring up to 5 mm similar to the prior CT. No discrete pancreatic
lesion identified. Correlation with history of prior pancreatitis
recommended. MRI may provide better evaluation of the pancreas if
clinically indicated.

Spleen: Normal in size without focal abnormality.

Adrenals/Urinary Tract: There is a subcentimeter left renal cyst.
The kidneys are unremarkable. There is symmetric and uniform
enhancement and excretion of contrast by the kidneys. The visualized
ureters and urinary bladder appear unremarkable.

Stomach/Bowel: There is postsurgical changes of partial sigmoid
resection with anastomotic suture. There is no evidence of bowel
obstruction. Loose stool noted throughout the colon compatible with
diarrheal state. There is slight thickening of the rectosigmoid
which may represent mild inflammatory changes. The appendix is
normal.

Vascular/Lymphatic: No significant vascular findings are present. No
enlarged abdominal or pelvic lymph nodes.

Reproductive: Hysterectomy.  No pelvic masses.

Other: Midline vertical anterior pelvic wall incisional scar.

Musculoskeletal: Osteopenia with degenerative changes of the spine.
No acute fracture.
IMPRESSION: 1. Postsurgical changes of partial sigmoid resection. No bowel
obstruction. Normal appendix.
2. Mild thickened appearance of the rectosigmoid may represent mild
inflammation. Clinical correlation is recommended.
3. Mildly prominent main pancreatic duct. No discrete pancreatic
lesion identified. Findings may be sequela of chronic inflammation.
MRI may provide better evaluation of the pancreas if clinically
indicated.

## 2019-02-21 IMAGING — CT CT HEAD W/O CM
3 of 8 series · 13 of 47 positions shown, 15 images · non-contrast
Comparison: MRI of the head December 17, 2016 and MRI of the cervical

CLINICAL DATA: Blurry vision, word-finding difficulty and leg
weakness beginning at 0939 hours yesterday. Multiple falls and head
injury, mild headache. History of migraines, stroke.

EXAM:
CT HEAD WITHOUT CONTRAST
CT CERVICAL SPINE WITHOUT CONTRAST
TECHNIQUE: Multidetector CT imaging of the head and cervical spine was
performed following the standard protocol without intravenous
contrast. Multiplanar CT image reconstructions of the cervical spine
were also generated.

[Series 6: sagittal · sagittal · 0.27mm/px · 2 of 74 slices shown]
[im 25/74  brain]
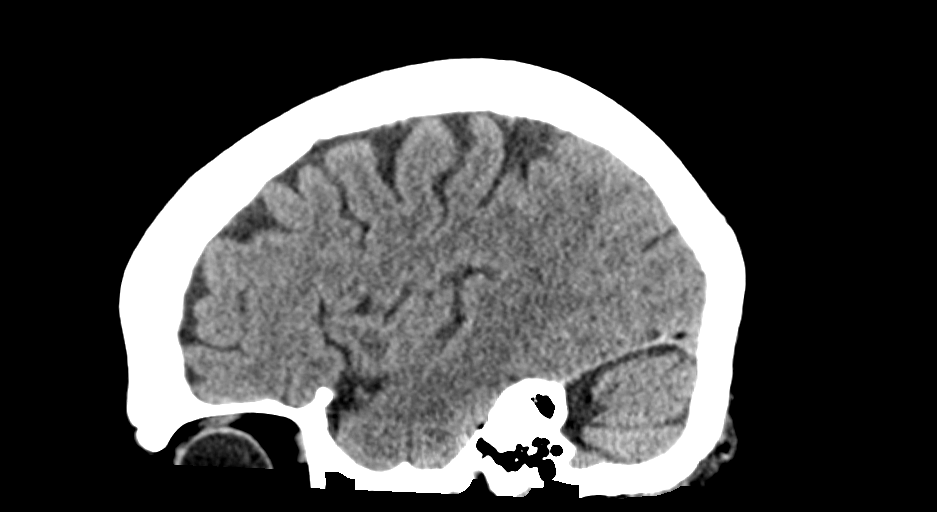
[im 49/74  brain]
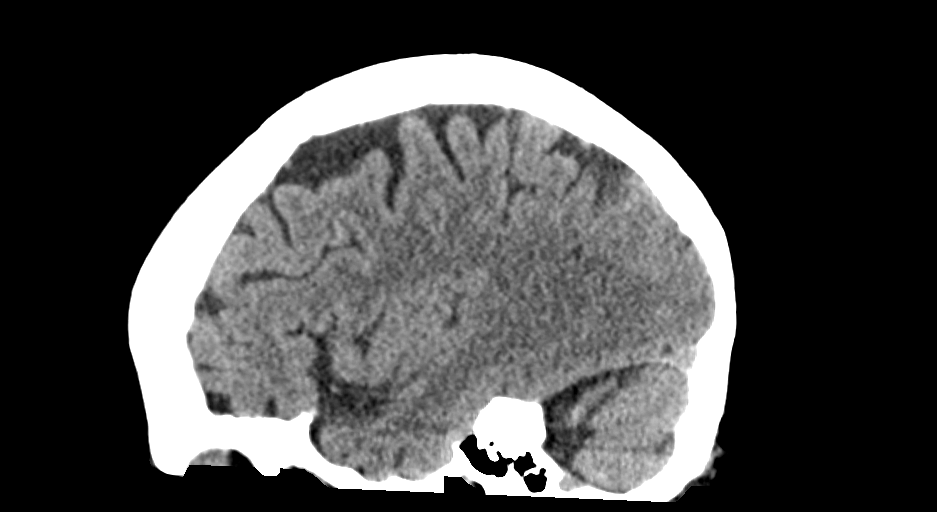

[Series 10: axial recon · axial · 0.23mm/px · z∈[-312,-161]mm · 8 of 98 slices shown, 10 images]
[im 9/98  brain]
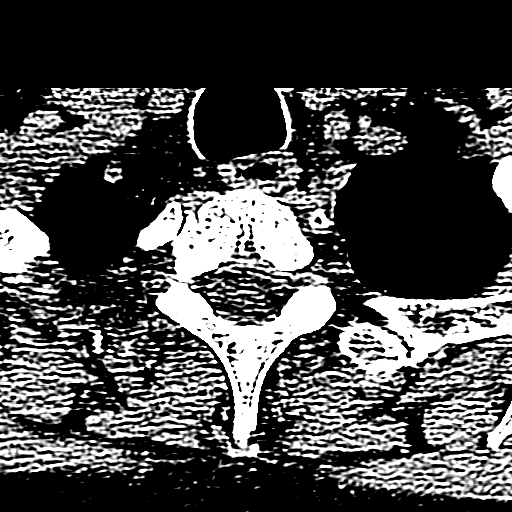
[im 9/98  bone]
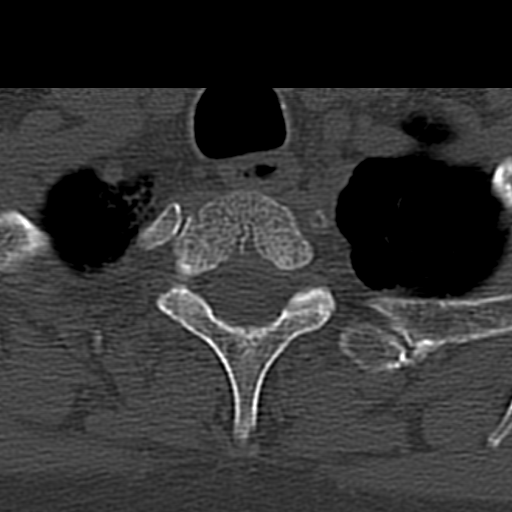
[im 18/98  brain]
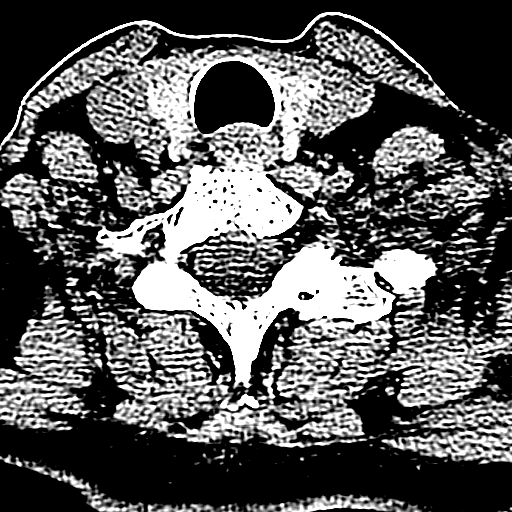
[im 36/98  brain]
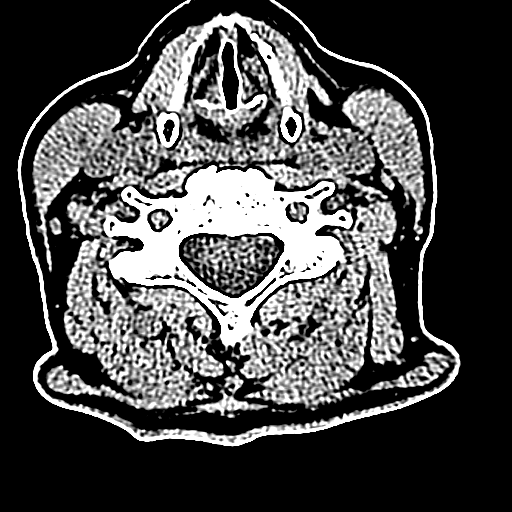
[im 45/98  brain]
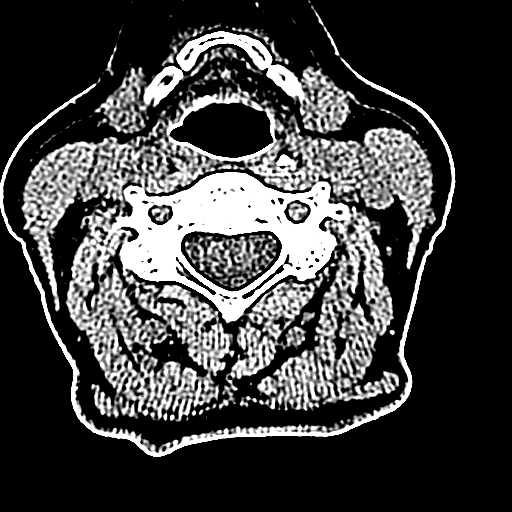
[im 53/98  brain]
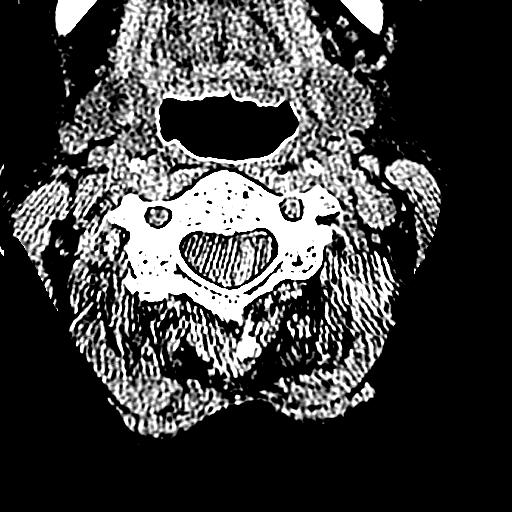
[im 53/98  bone]
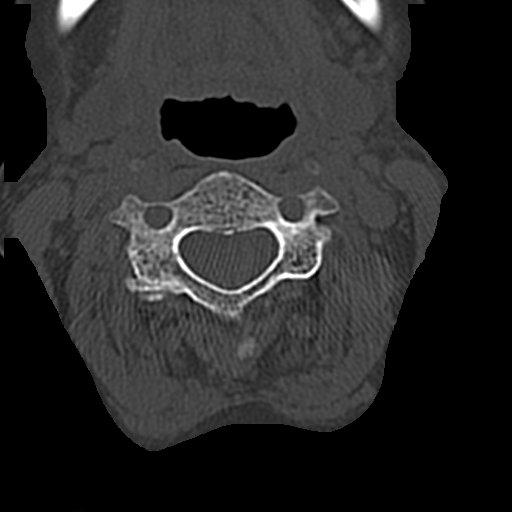
[im 62/98  brain]
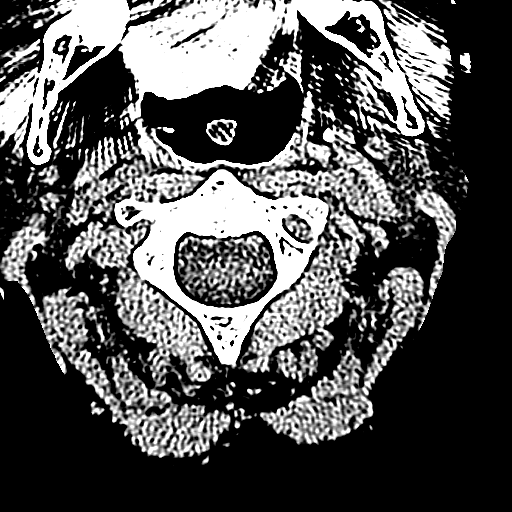
[im 80/98  brain]
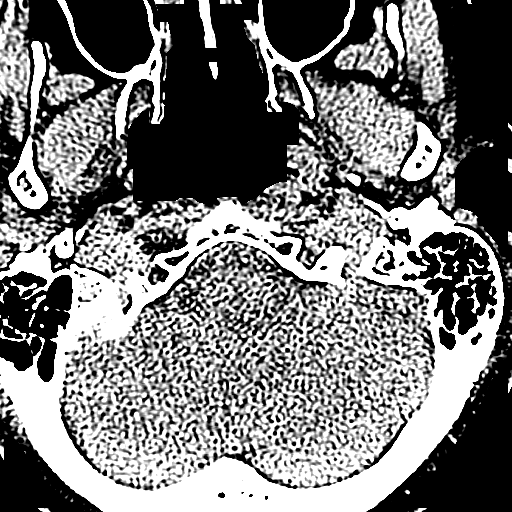
[im 89/98  brain]
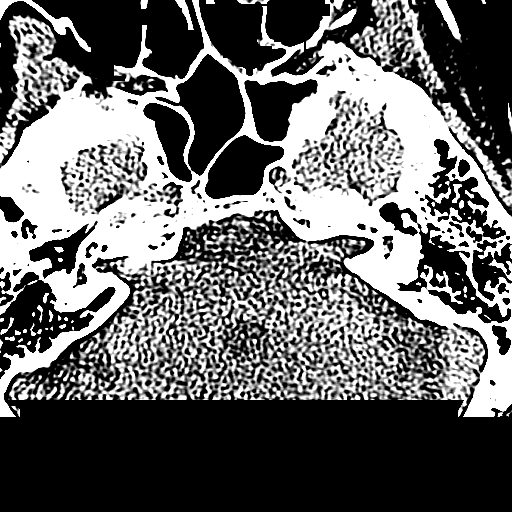

[Series 11: coronal · coronal · 0.25mm/px · 3 of 61 slices shown]
[im 16/61  brain]
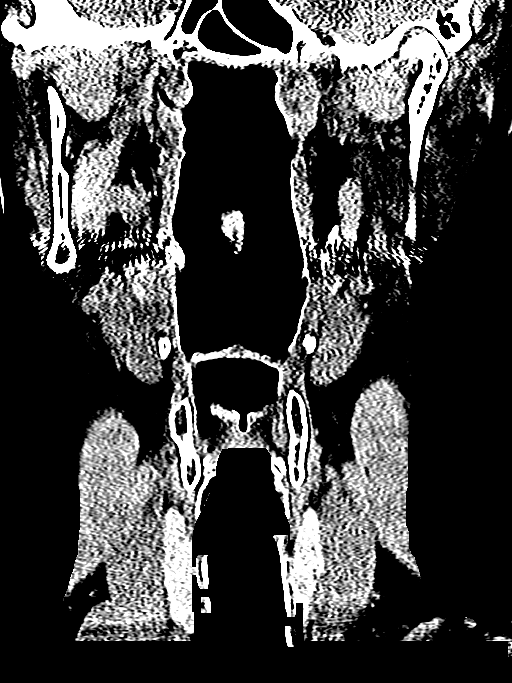
[im 31/61  brain]
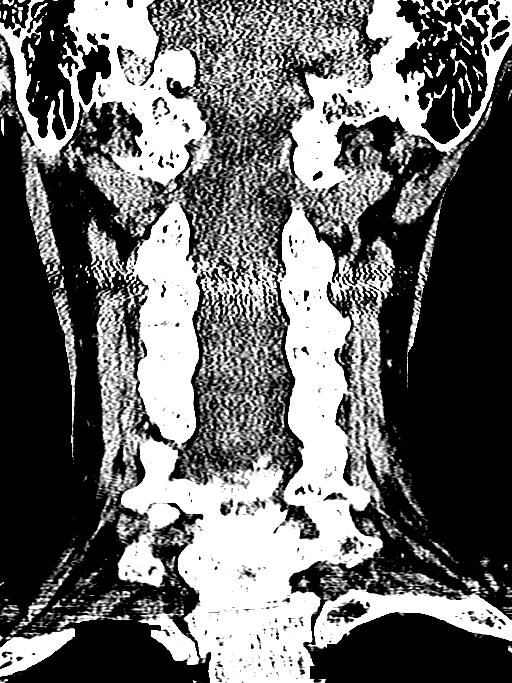
[im 46/61  brain]
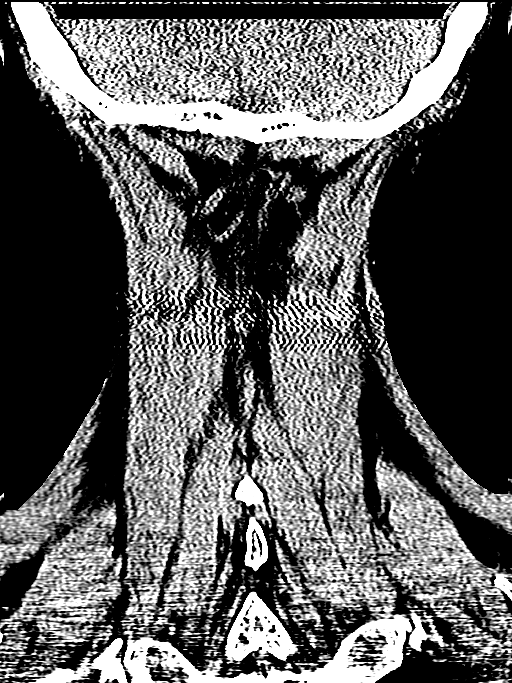

[13 of 47 positions shown; findings below may reference images not displayed]

FINDINGS: CT HEAD FINDINGS

BRAIN: No intraparenchymal hemorrhage, mass effect nor midline
shift. The ventricles and sulci are normal for age. No acute large
vascular territory infarcts. Beam hardening artifact through the
pons most conspicuous on sagittal reformation. No abnormal
extra-axial fluid collections. Basal cisterns are patent.

VASCULAR: Trace calcific atherosclerosis of the carotid siphons.

SKULL: No skull fracture. No significant scalp soft tissue swelling.

SINUSES/ORBITS: The mastoid air-cells and included paranasal sinuses
are well-aerated.The included ocular globes and orbital contents are
non-suspicious.

OTHER: None.

CT CERVICAL SPINE FINDINGS

ALIGNMENT: Maintained lordosis. Vertebral bodies in alignment.

SKULL BASE AND VERTEBRAE: Cervical vertebral bodies and posterior
elements are intact. Mild C6-7 disc height loss with mild ventral
endplate spurring C5-6 and C6-7. Moderate RIGHT C2-3 and LEFT C7-T1
facet arthropathy. No destructive bony lesions. C1-2 articulation
maintained.

SOFT TISSUES AND SPINAL CANAL: Normal.

DISC LEVELS: No significant osseous canal stenosis. Mild LEFT
greater than RIGHT C6-7 neural foraminal narrowing.

UPPER CHEST: Lung apices are clear.

OTHER: None.
IMPRESSION: CT HEAD: Negative for age.

CT CERVICAL SPINE:  Negative for age.

## 2019-03-18 IMAGING — CR DG CHEST 2V
2 series · 2 of 2 positions shown · non-contrast
Comparison: 01/23/2017

CLINICAL DATA: Recent falls

EXAM:
CHEST  2 VIEW

[chest lat]
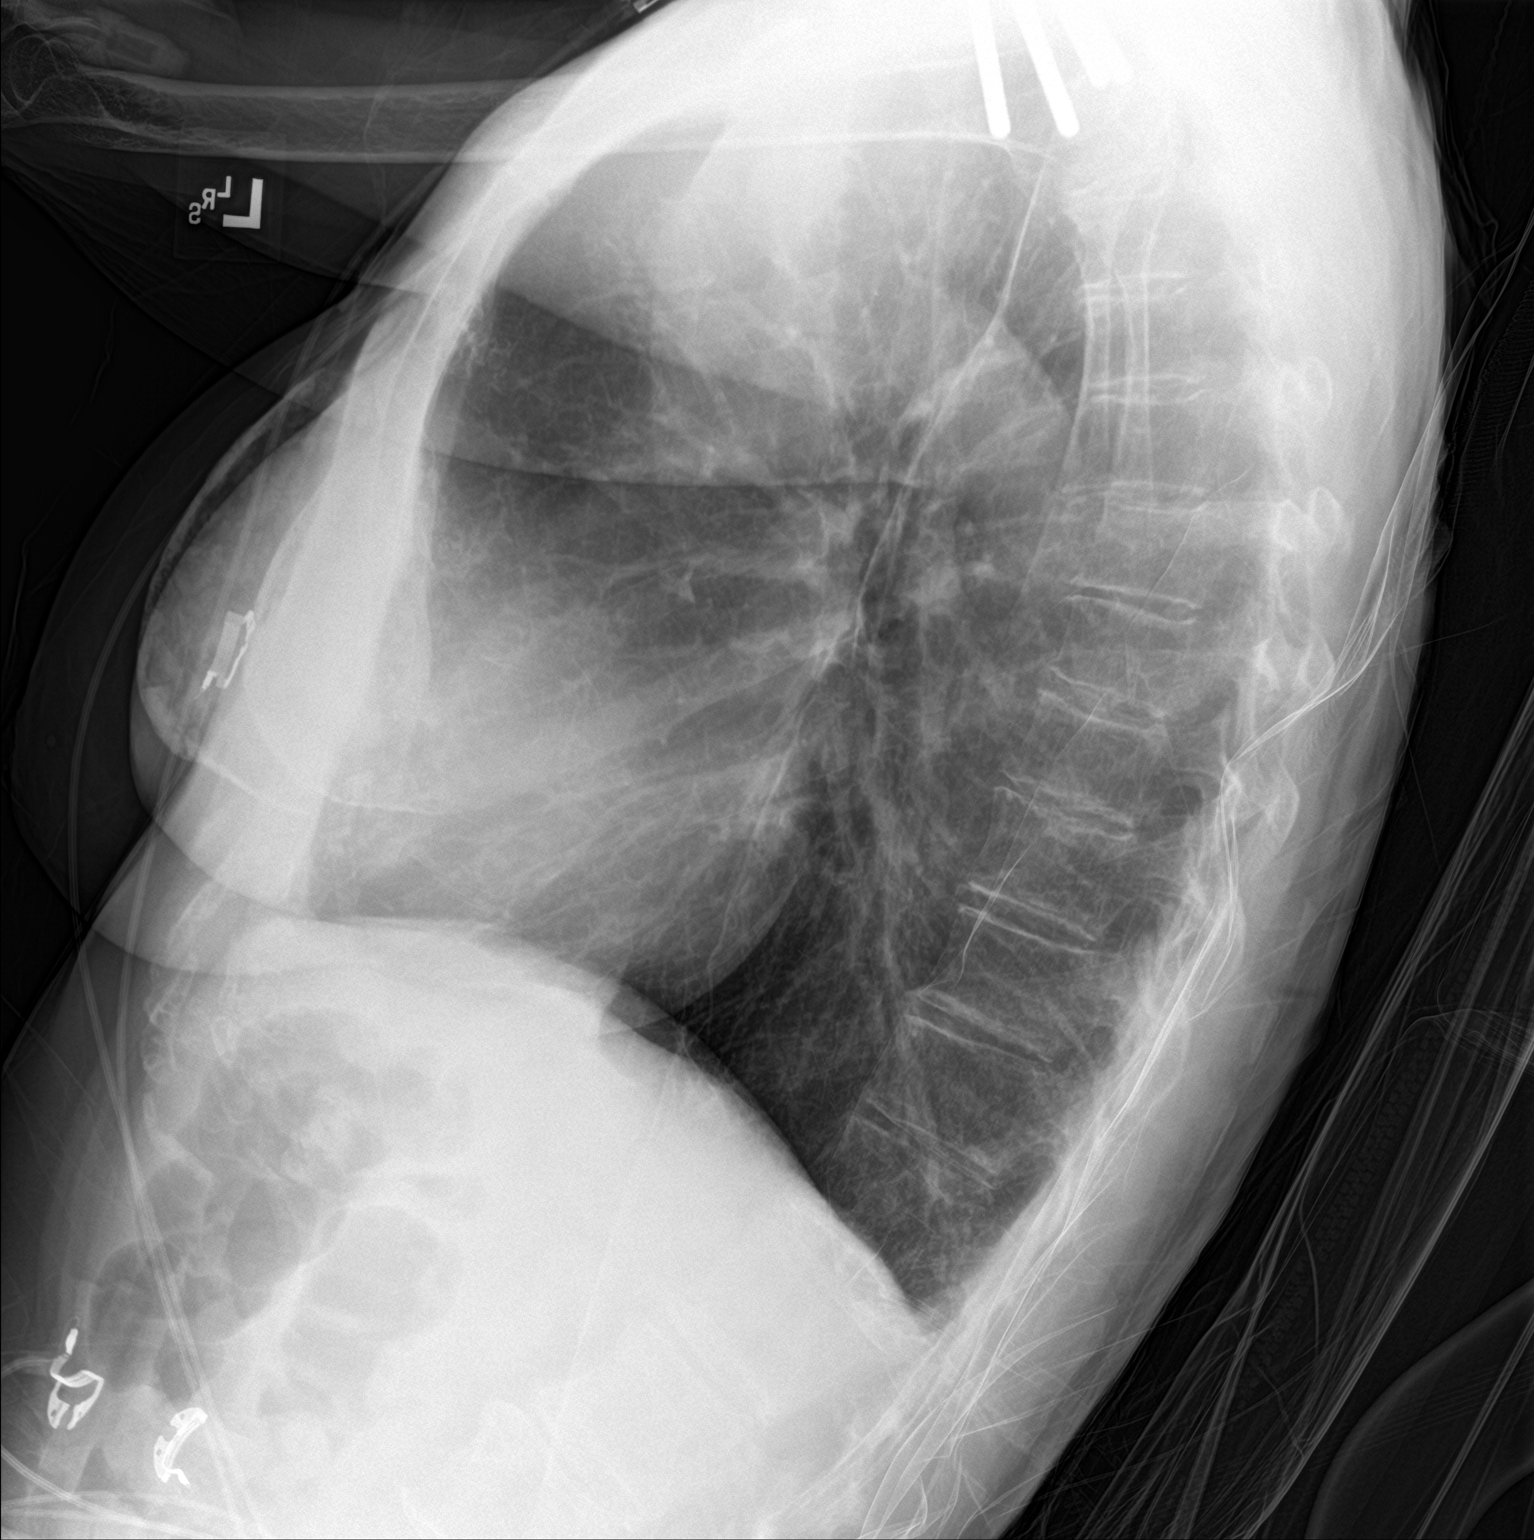

[chest ap]
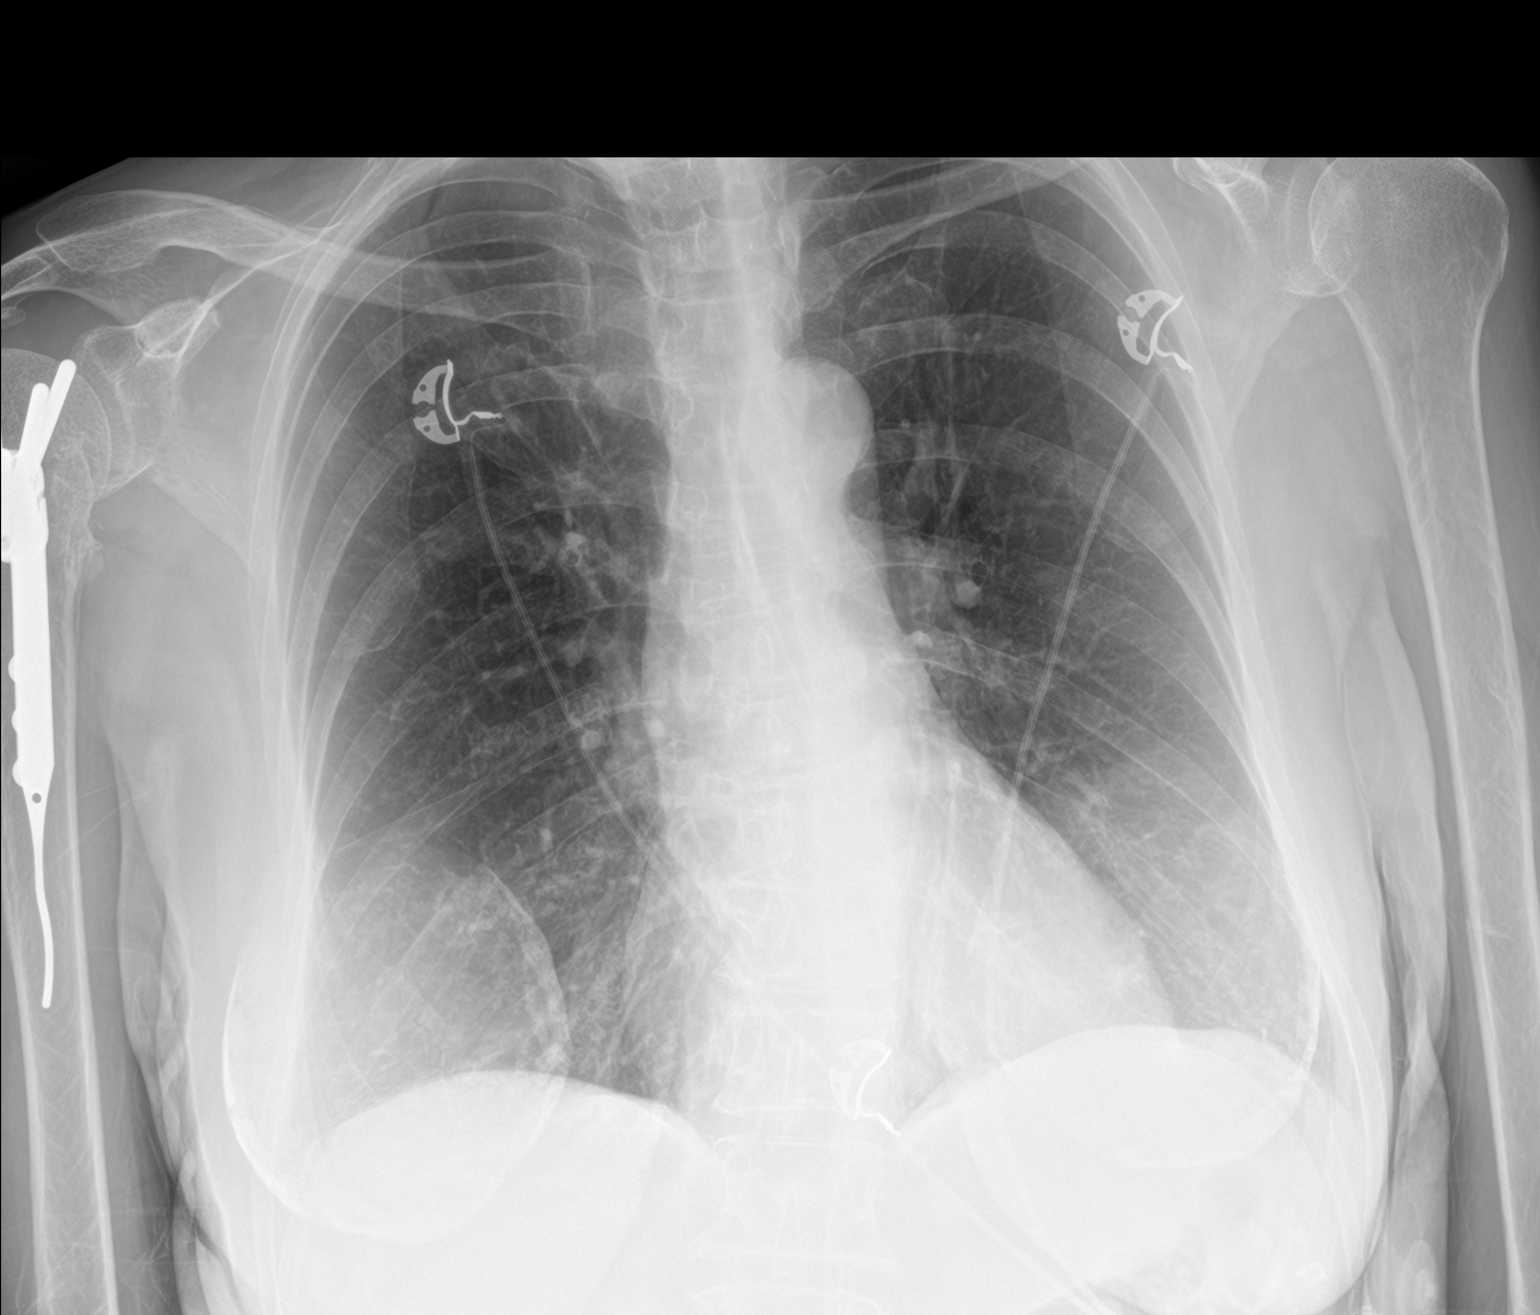

[2 of 2 positions shown; findings below may reference images not displayed]

FINDINGS: Cardiac shadow is stable. The lungs are well aerated bilaterally. No
focal infiltrate or sizable effusion is seen. Calcified breast
implants are noted bilaterally. Postsurgical changes in the right
proximal humerus are seen.
IMPRESSION: No active cardiopulmonary disease.

## 2019-04-24 ENCOUNTER — Other Ambulatory Visit: Payer: Self-pay | Admitting: Neurology

## 2019-04-29 IMAGING — DX DG CHEST 1V PORT
1 series · 1 of 1 positions shown · non-contrast
Comparison: February 17, 2017

CLINICAL DATA: Pain following fall

EXAM:
PORTABLE CHEST 1 VIEW

[chest ap]
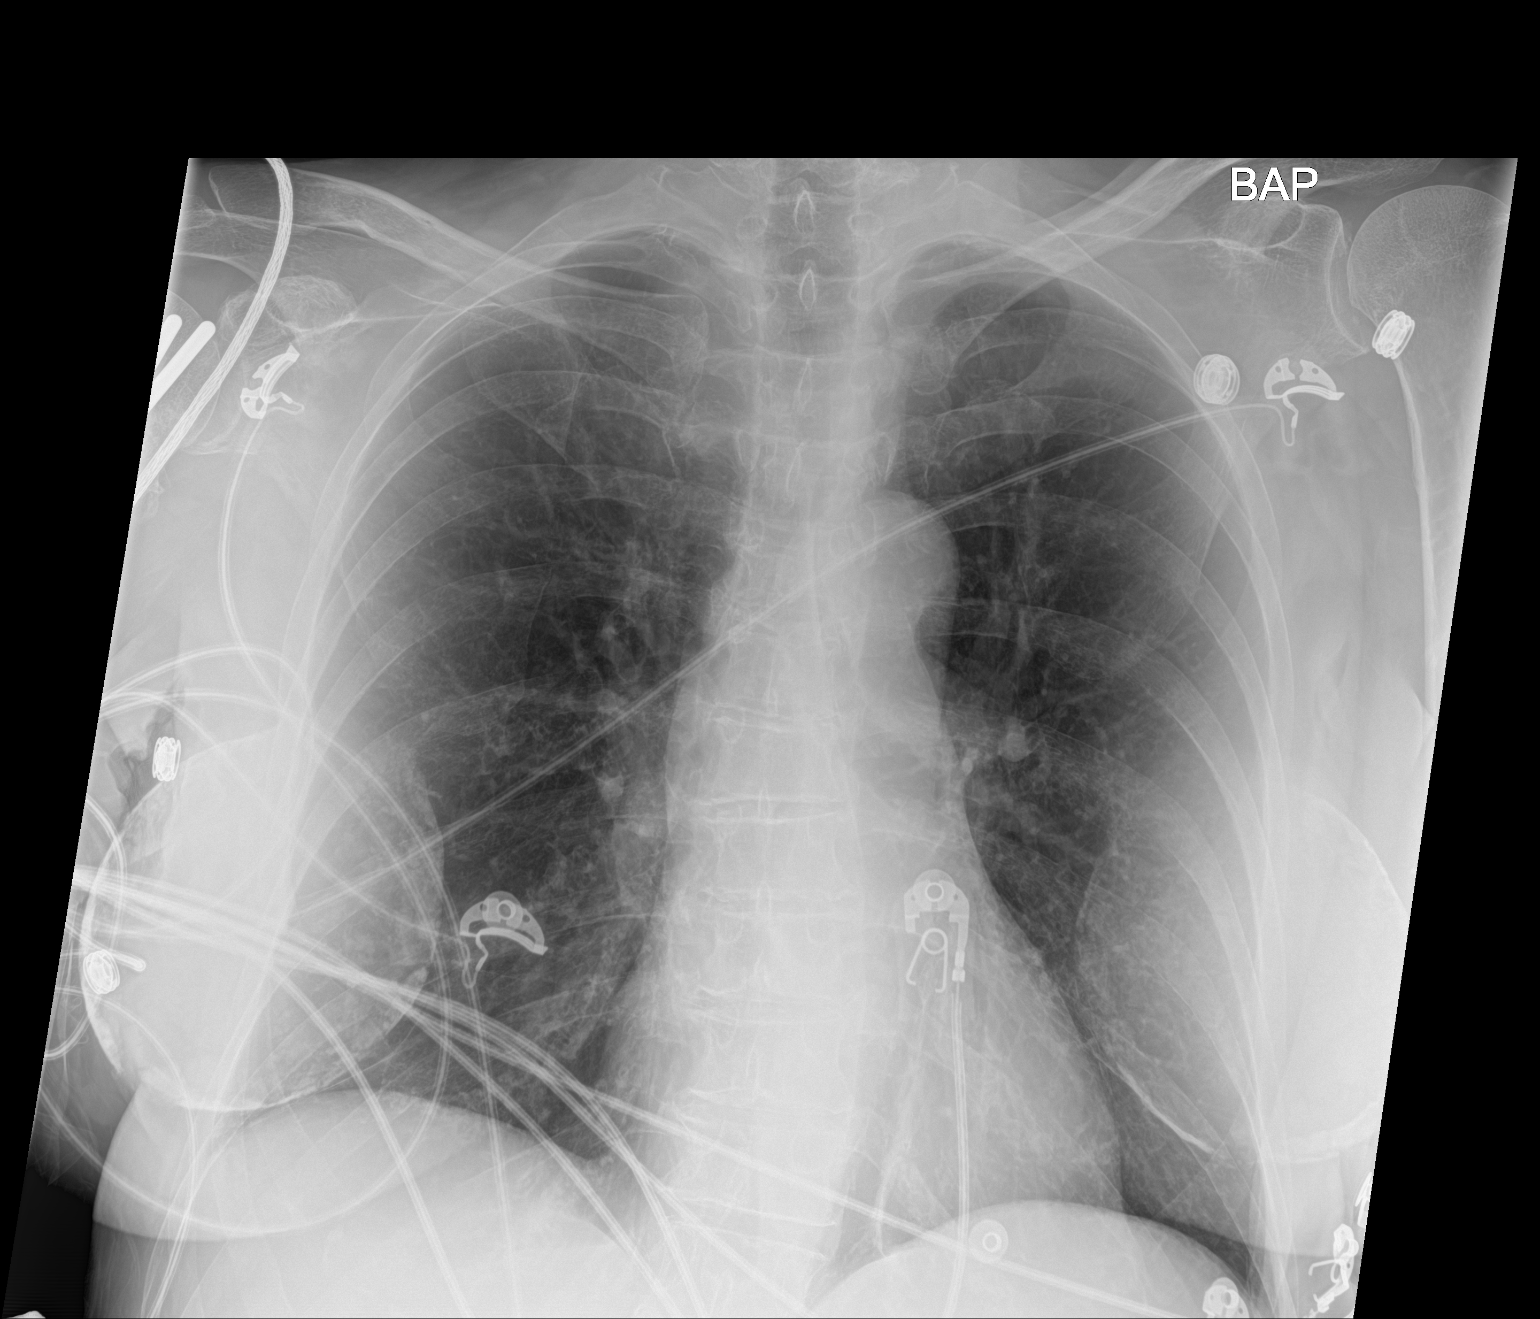

[1 of 1 positions shown; findings below may reference images not displayed]

FINDINGS: There is no edema or consolidation. There is scarring in the medial
left base. Heart size and pulmonary vascularity are normal. No
adenopathy. No pneumothorax. No acute fracture evident. There are
breast implants bilaterally.
IMPRESSION: Scarring in medial left base. No edema or consolidation. Stable
cardiac silhouette. No pneumothorax evident.

## 2019-04-29 IMAGING — CT CT HEAD W/O CM
4 of 8 series · 16 of 47 positions shown, 18 images · non-contrast
Comparison: Brain MRI 02/17/2017. Head and cervical spine CT
02/17/2017.

CLINICAL DATA: Fall. Headache. Neck pain. Imbalance. Initial
encounter.

EXAM:
CT HEAD WITHOUT CONTRAST
CT CERVICAL SPINE WITHOUT CONTRAST
TECHNIQUE: Multidetector CT imaging of the head and cervical spine was
performed following the standard protocol without intravenous
contrast. Multiplanar CT image reconstructions of the cervical spine
were also generated.

[Series 4: head bone · axial · 0.44mm/px · z∈[-248,-172]mm · 4 of 90 slices shown]
[im 13/90  bone]
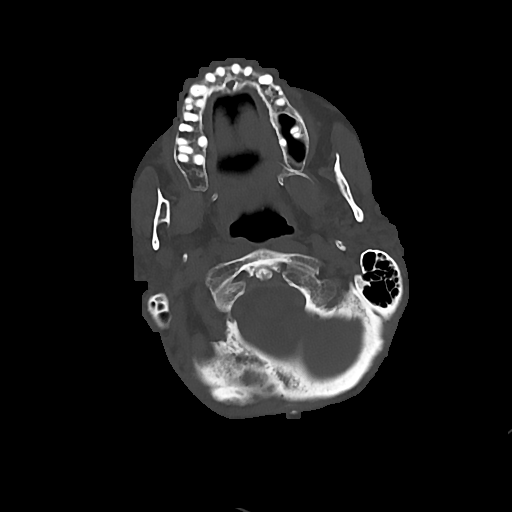
[im 26/90  bone]
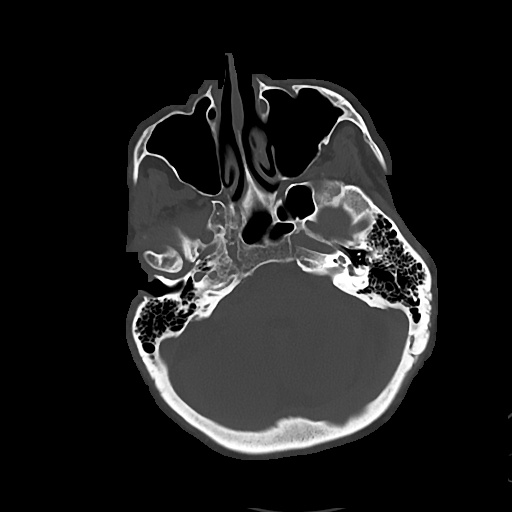
[im 39/90  bone]
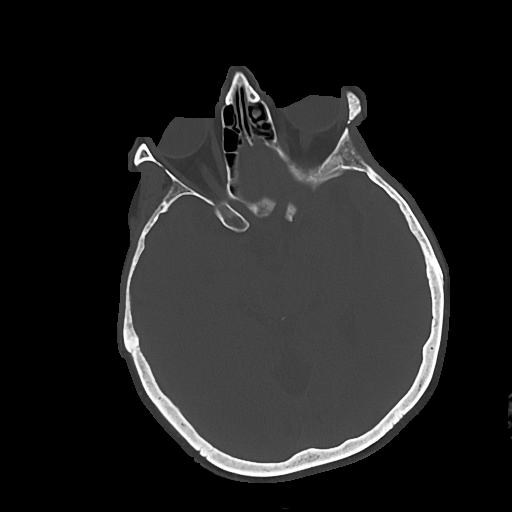
[im 51/90  bone]
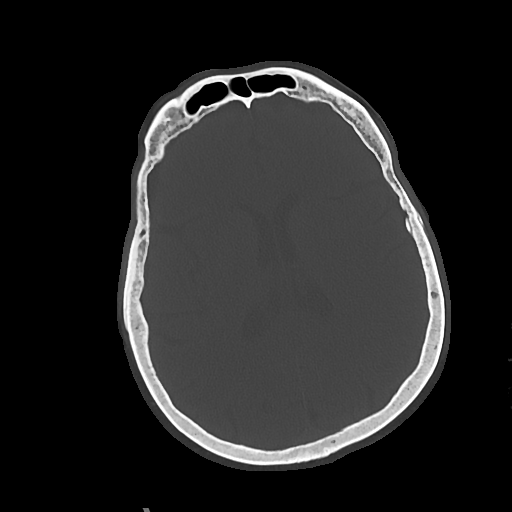

[Series 5: cor soft · coronal · 0.36mm/px · 3 of 71 slices shown]
[im 27/71  brain]
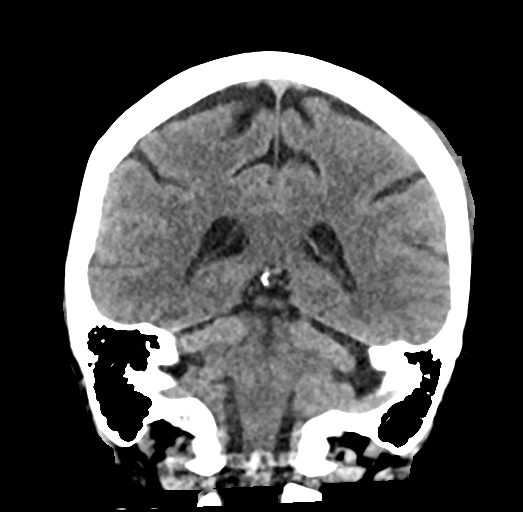
[im 36/71  brain]
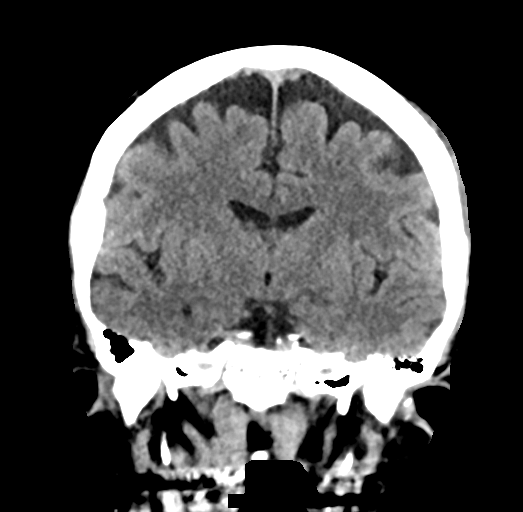
[im 44/71  brain]
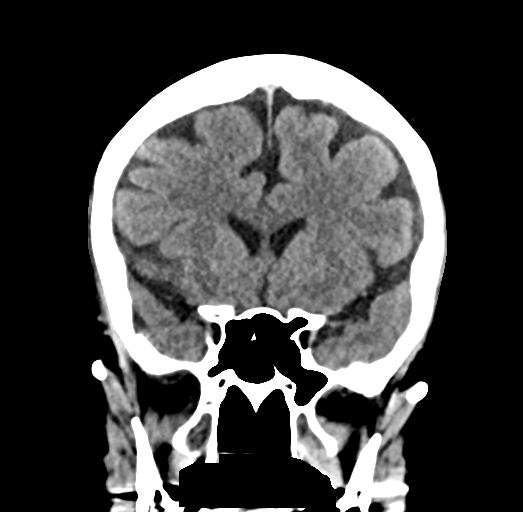

[Series 7: sag soft · sagittal · 0.37mm/px · 2 of 65 slices shown]
[im 22/65  brain]
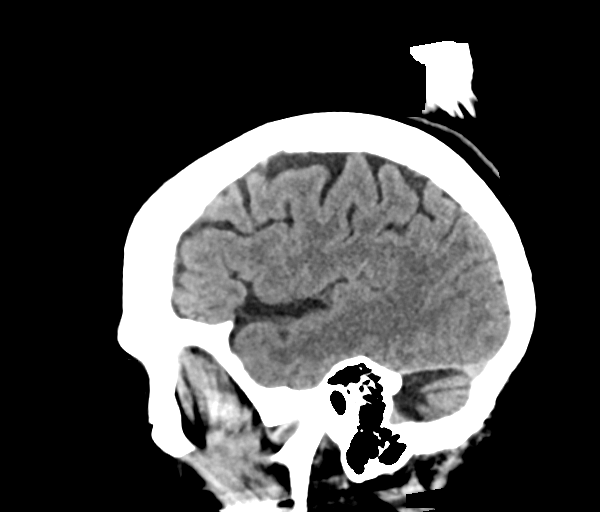
[im 43/65  brain]
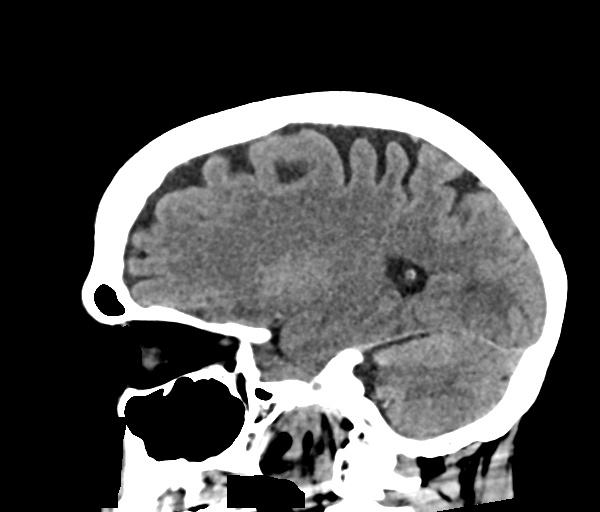

[Series 12: orthogonal axials · axial · 0.21mm/px · z∈[-402,-264]mm · 7 of 92 slices shown, 9 images]
[im 12/92  brain]
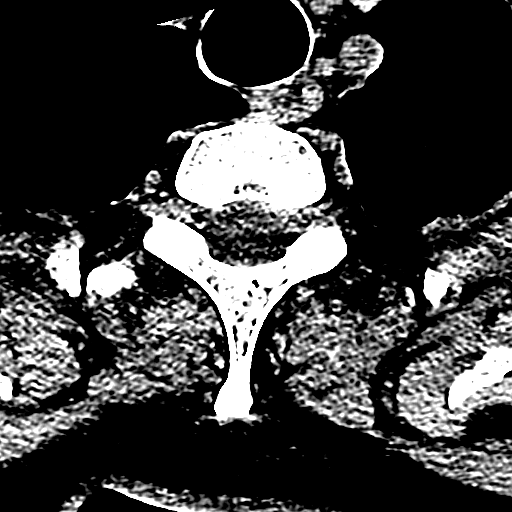
[im 12/92  bone]
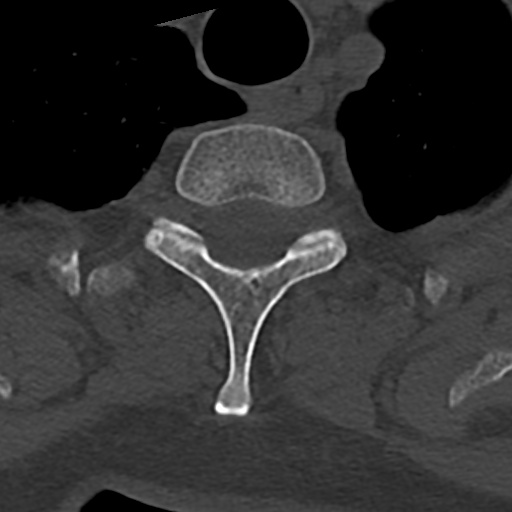
[im 23/92  brain]
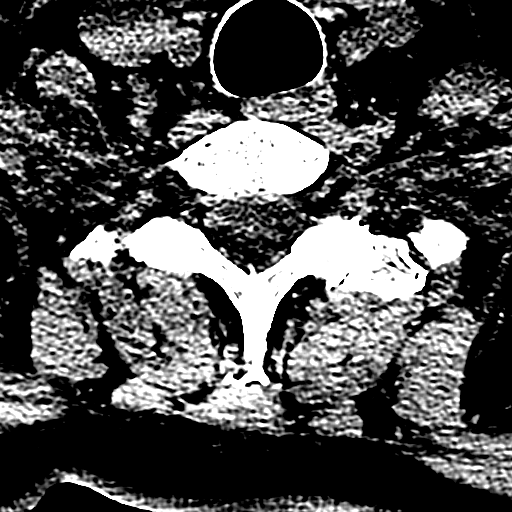
[im 35/92  brain]
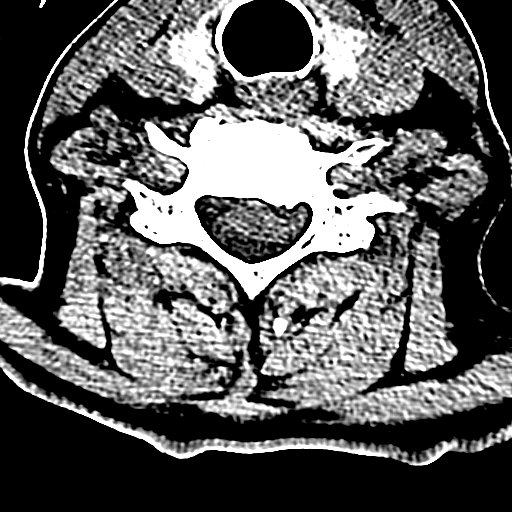
[im 46/92  brain]
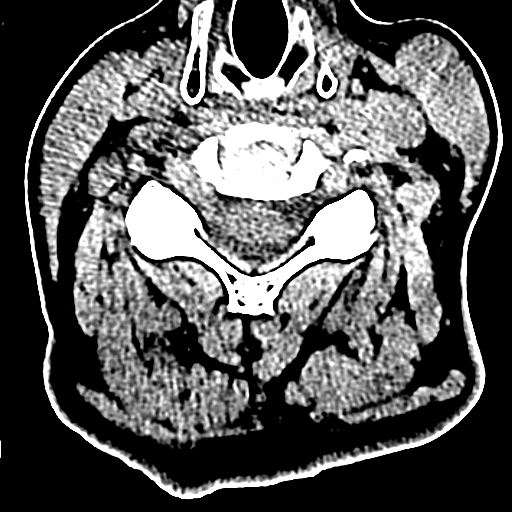
[im 57/92  brain]
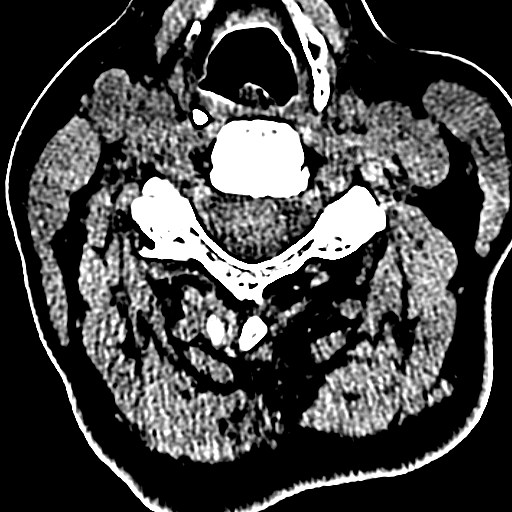
[im 57/92  bone]
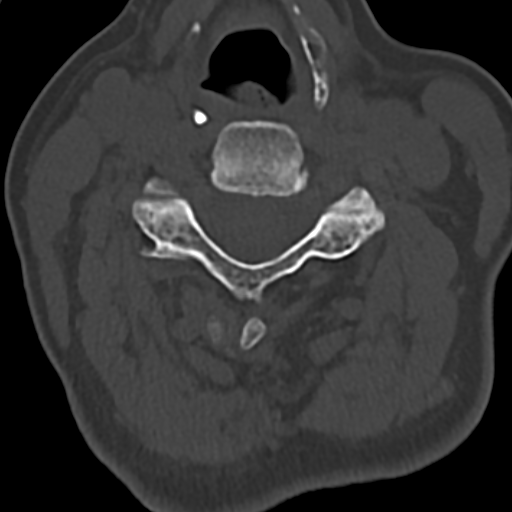
[im 69/92  brain]
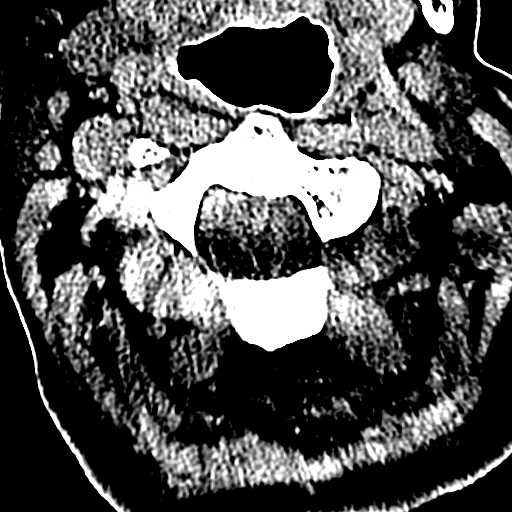
[im 80/92  brain]
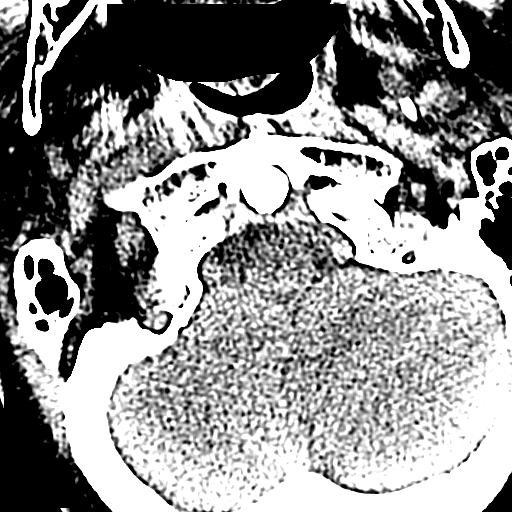

[16 of 47 positions shown; findings below may reference images not displayed]

FINDINGS: CT HEAD FINDINGS

Brain: There is no evidence of acute infarct, intracranial
hemorrhage, mass, midline shift, or extra-axial fluid collection.
Mild generalized cerebral atrophy is within normal limits for age.

Vascular: Calcified atherosclerosis at the skullbase. No hyperdense
vessel.

Skull: No fracture or focal osseous lesion.

Sinuses/Orbits: Paranasal sinuses and mastoid air cells are clear.
Unremarkable orbits.

Other: Small left parietal scalp hematoma.

CT CERVICAL SPINE FINDINGS

Alignment: Mild cervical spine straightening. Unchanged trace
retrolisthesis of C6 on C7, likely degenerative.

Skull base and vertebrae: No fracture destructive osseous process.

Soft tissues and spinal canal: No prevertebral swelling.

Disc levels: Mild lower cervical disc degeneration, greatest at C6-7
and unchanged from the prior examination. Moderate left facet
arthrosis at C7-T1.

Upper chest: Unremarkable.

Other: None.
IMPRESSION: 1. No evidence of acute intracranial abnormality.
2. Left parietal scalp hematoma.
3. No evidence of fracture or traumatic subluxation in the cervical
spine.

## 2019-04-30 IMAGING — DX DG PORTABLE PELVIS
1 series · 1 of 1 positions shown · non-contrast
Comparison: Fluoro spot radiographs from the intraoperative
procedure earlier today.

CLINICAL DATA: Status post left total hip joint prosthesis
placement.

EXAM:
PORTABLE PELVIS 1-2 VIEWS

[pelvis ap]
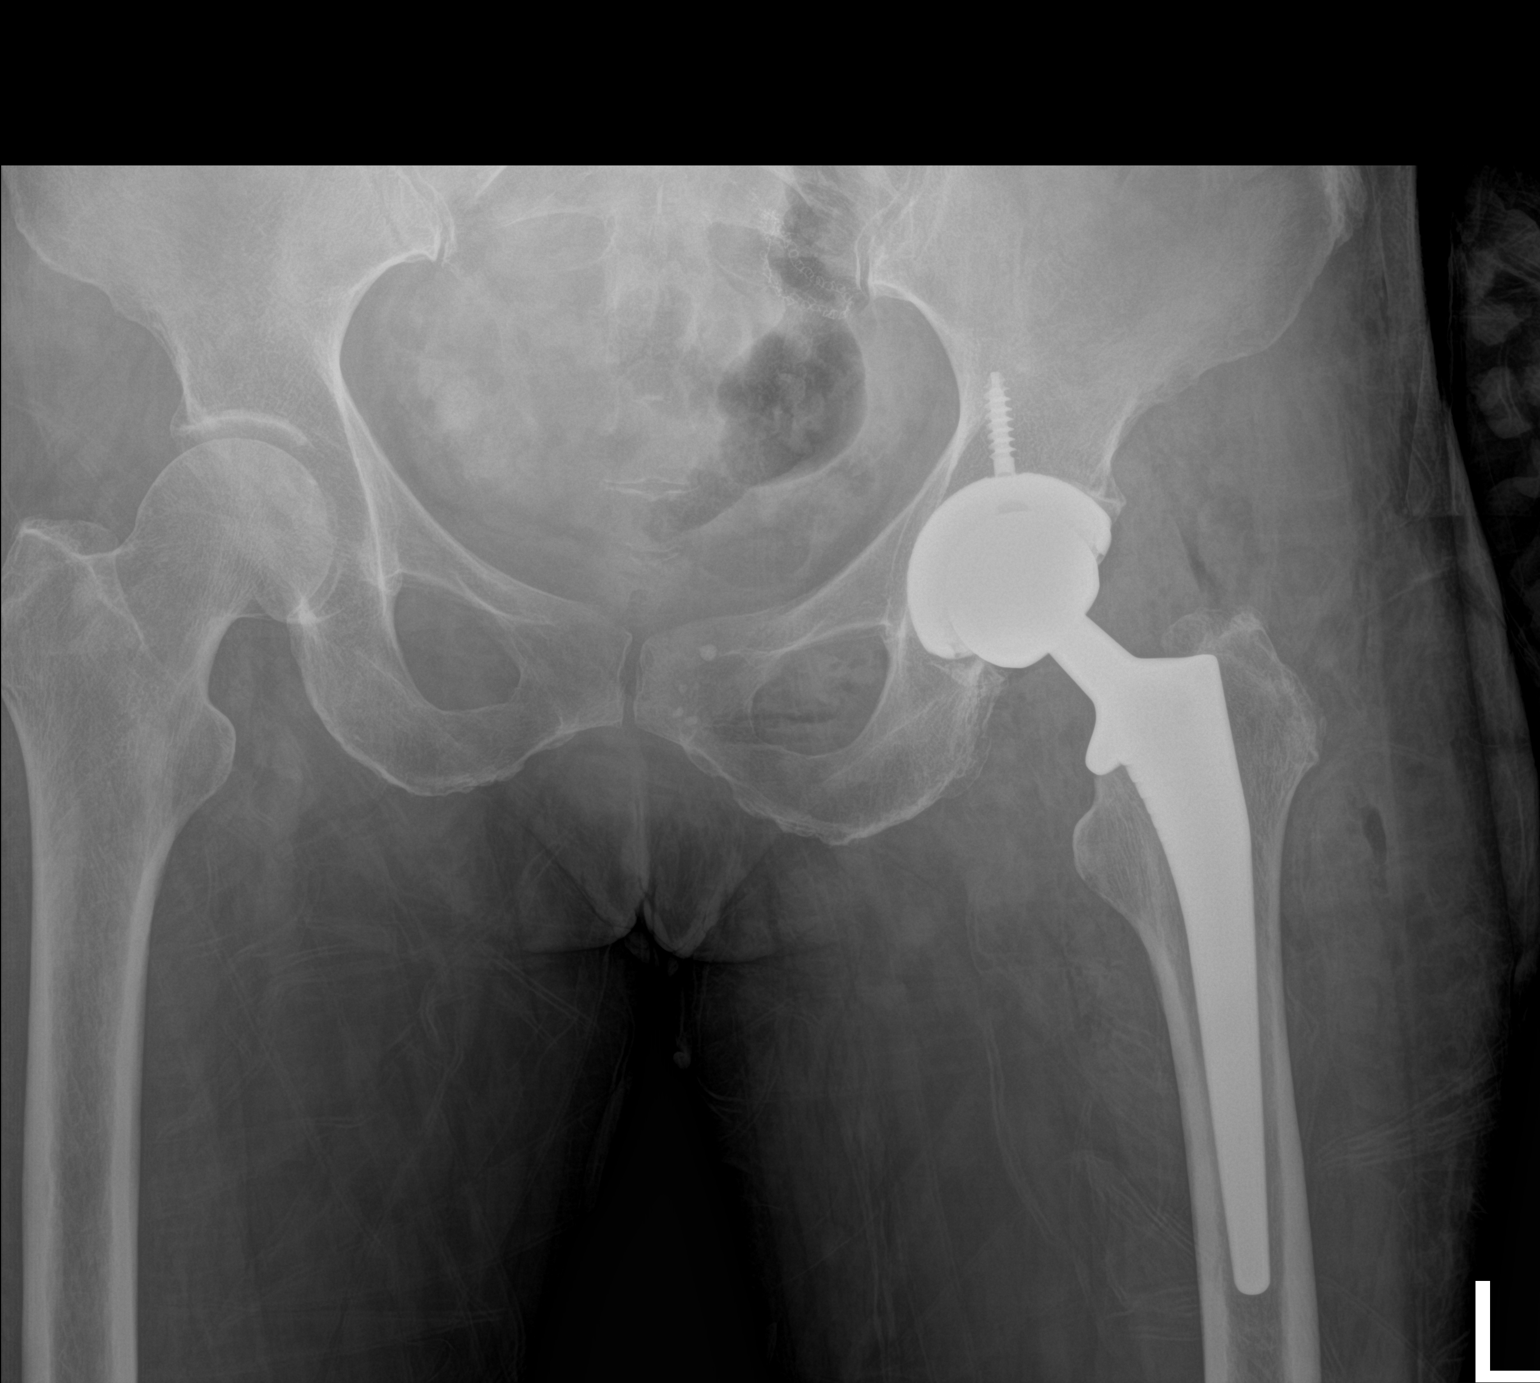

[1 of 1 positions shown; findings below may reference images not displayed]

FINDINGS: The patient has undergone left total hip joint prosthesis placement.
Radiographic positioning of the prosthetic components is good. The
interface with the native bone appears normal. No acute native bone
abnormality is observed.
IMPRESSION: There is no postprocedure complication following left total hip
joint prosthesis placement.

## 2019-06-08 IMAGING — CR DG HIP (WITH OR WITHOUT PELVIS) 3-4V BILAT
5 series · 5 of 5 positions shown · non-contrast
Comparison: None.

CLINICAL DATA: Multiple falls yesterday with hip pain, initial
encounter

EXAM:
DG HIP (WITH OR WITHOUT PELVIS) 4V BILAT

[t pelvis ap]
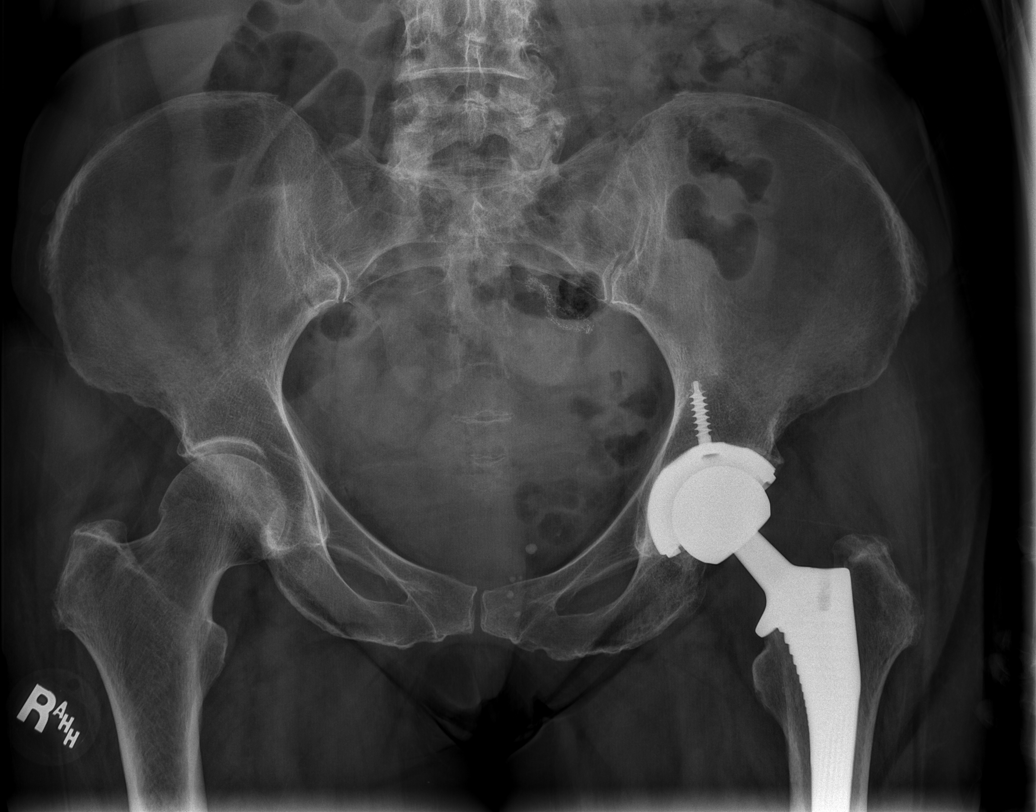

[t hip ap left]
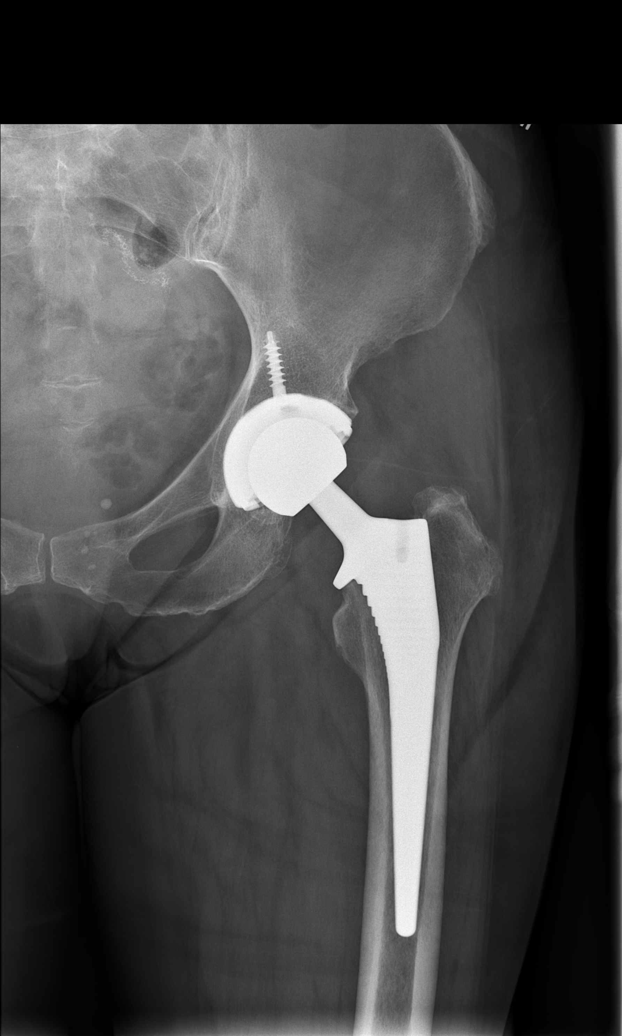

[t hip ap right]
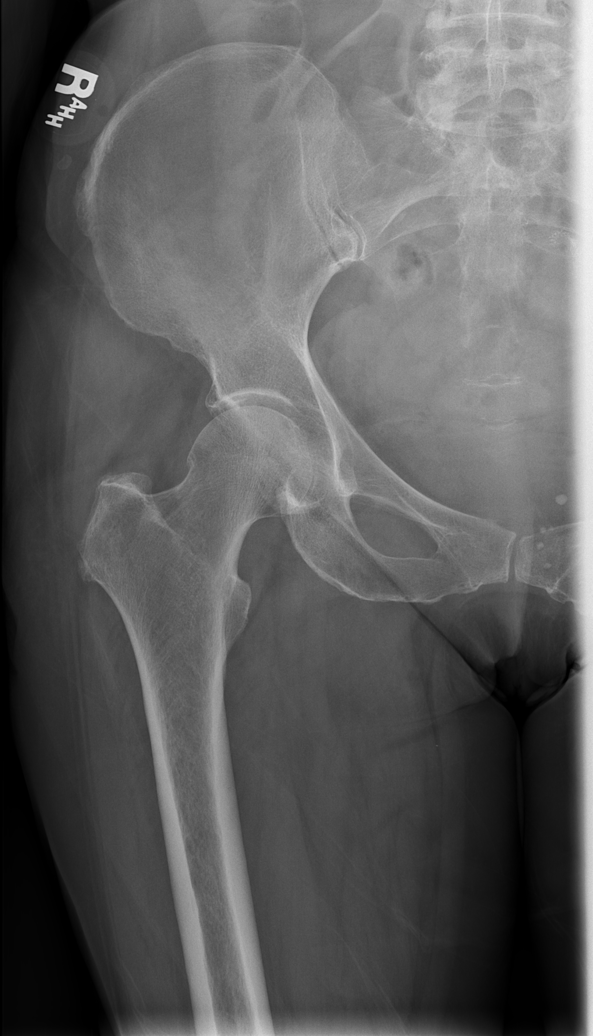

[t hip frog leg right]
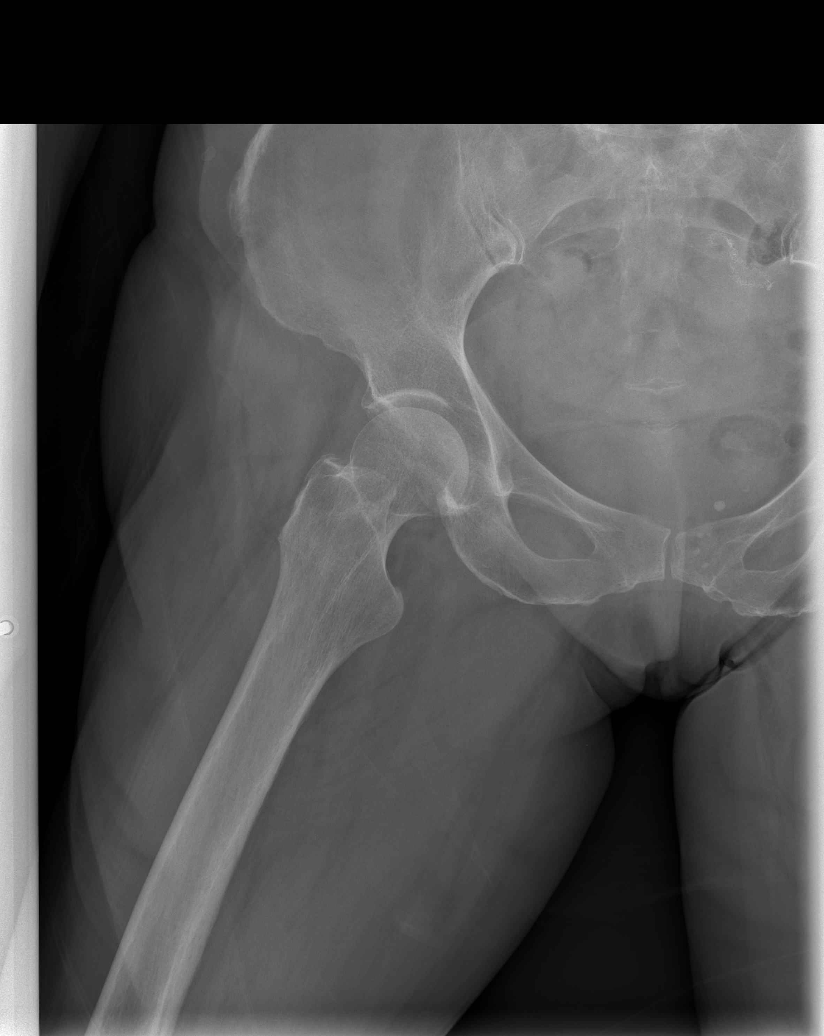

[t hip frog leg left]
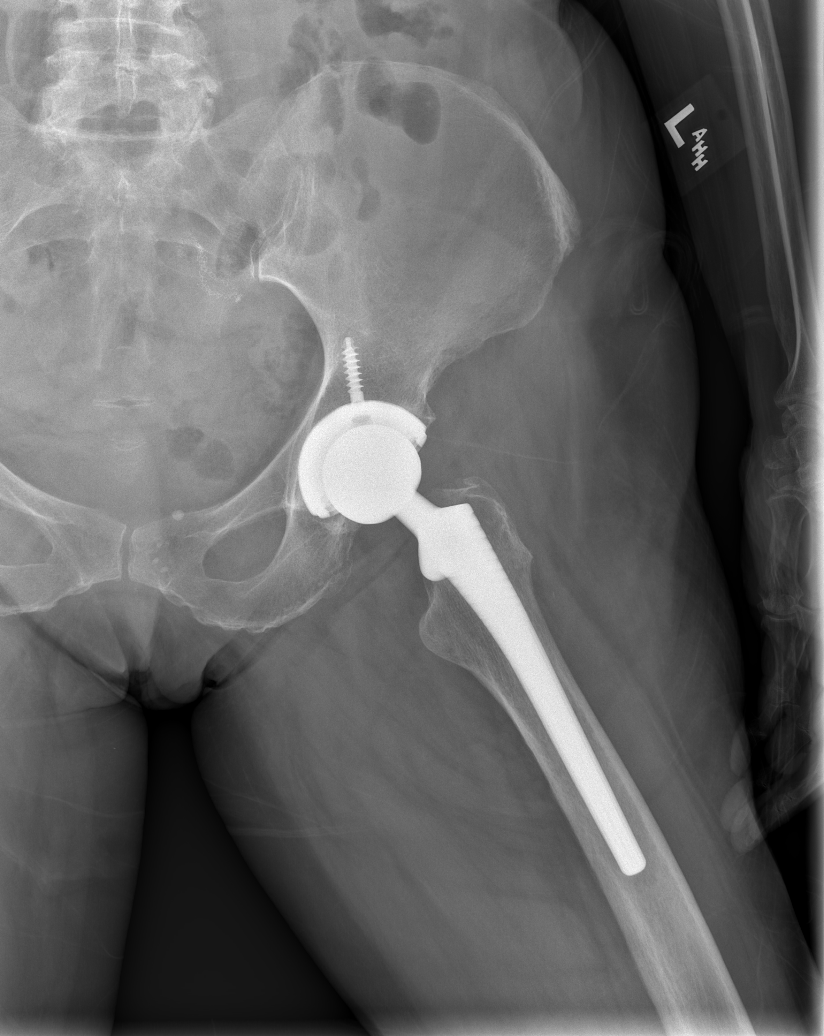

[5 of 5 positions shown; findings below may reference images not displayed]

FINDINGS: The pelvic ring is intact. Postsurgical changes consistent with left
hip replacement are noted. Changes consistent with a prior sigmoid
colectomy are noted as well. Degenerative changes of the lumbar
spine are seen. No acute fracture or dislocation is noted. The left
hip prosthesis appears within normal limits. No soft tissue changes
are noted.
IMPRESSION: No acute abnormality noted.

## 2019-06-08 IMAGING — CR DG LUMBAR SPINE COMPLETE 4+V
5 series · 5 of 5 positions shown · non-contrast
Comparison: CT abdomen and pelvis 01/16/2017.

CLINICAL DATA: Status post fall x4 yesterday. Low back pain.
Initial encounter.

EXAM:
LUMBAR SPINE - COMPLETE 4+ VIEW

[t lumbar spine ap]
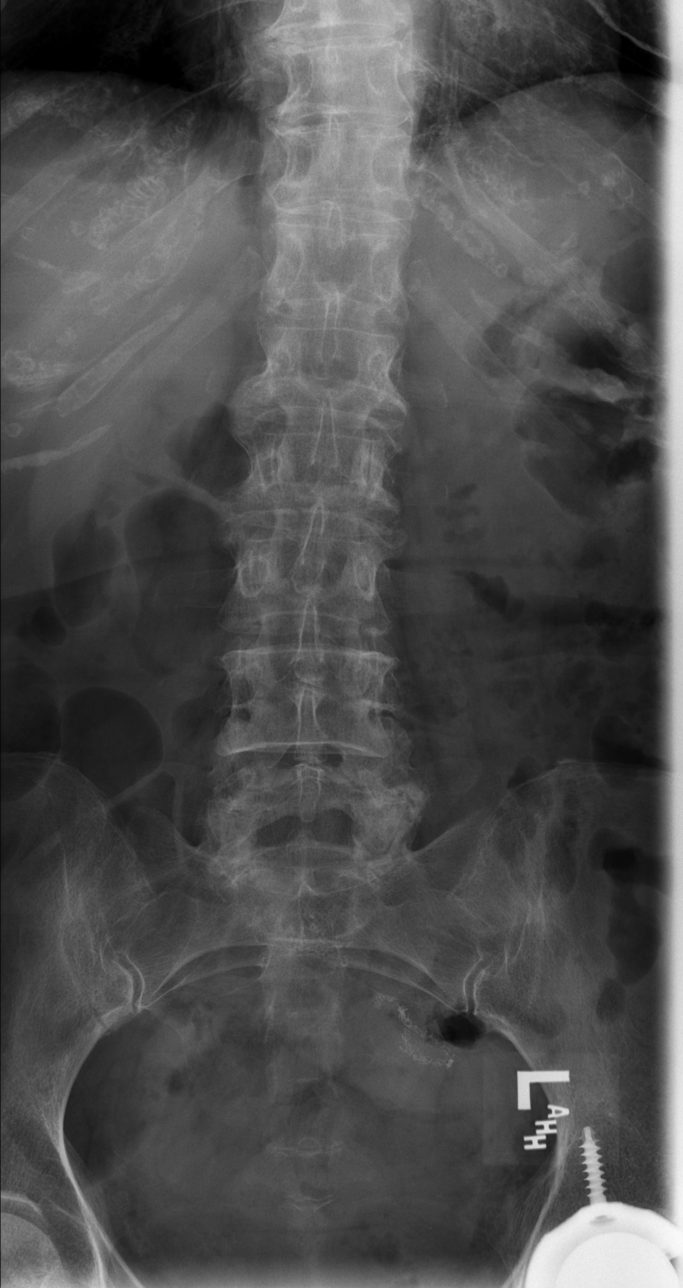

[t lumbar spine obl (1 of 2)]
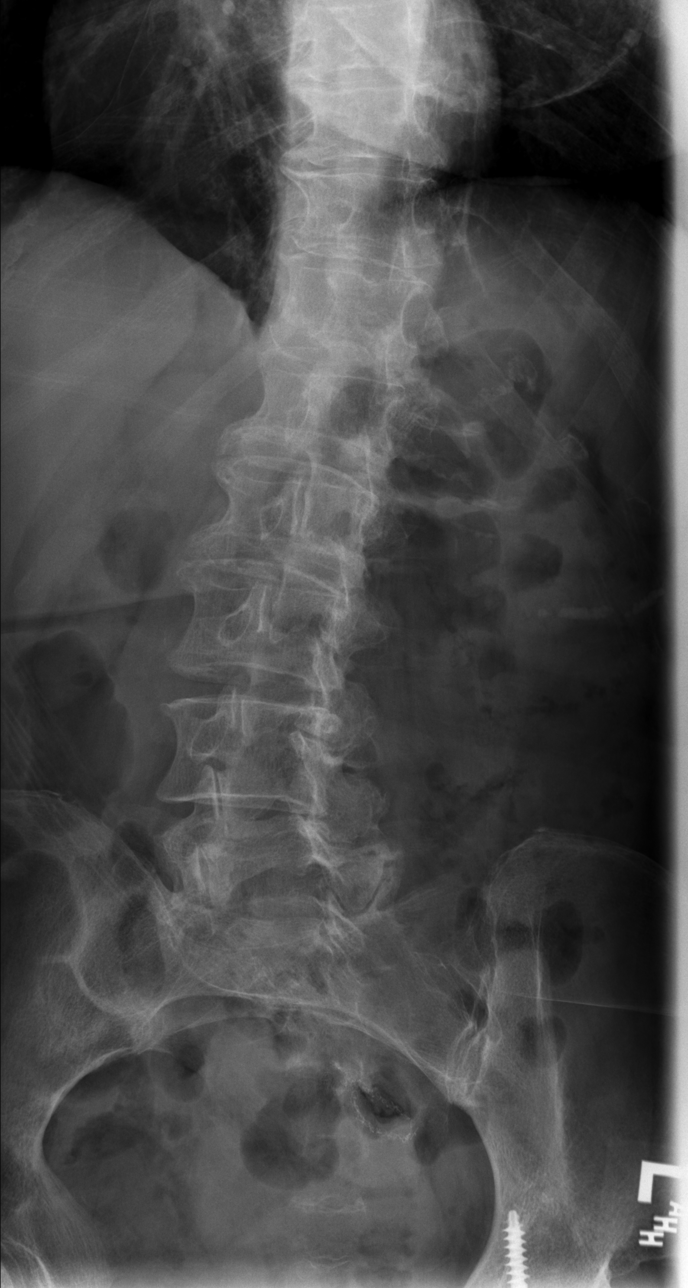

[t lumbar spine obl (2 of 2)]
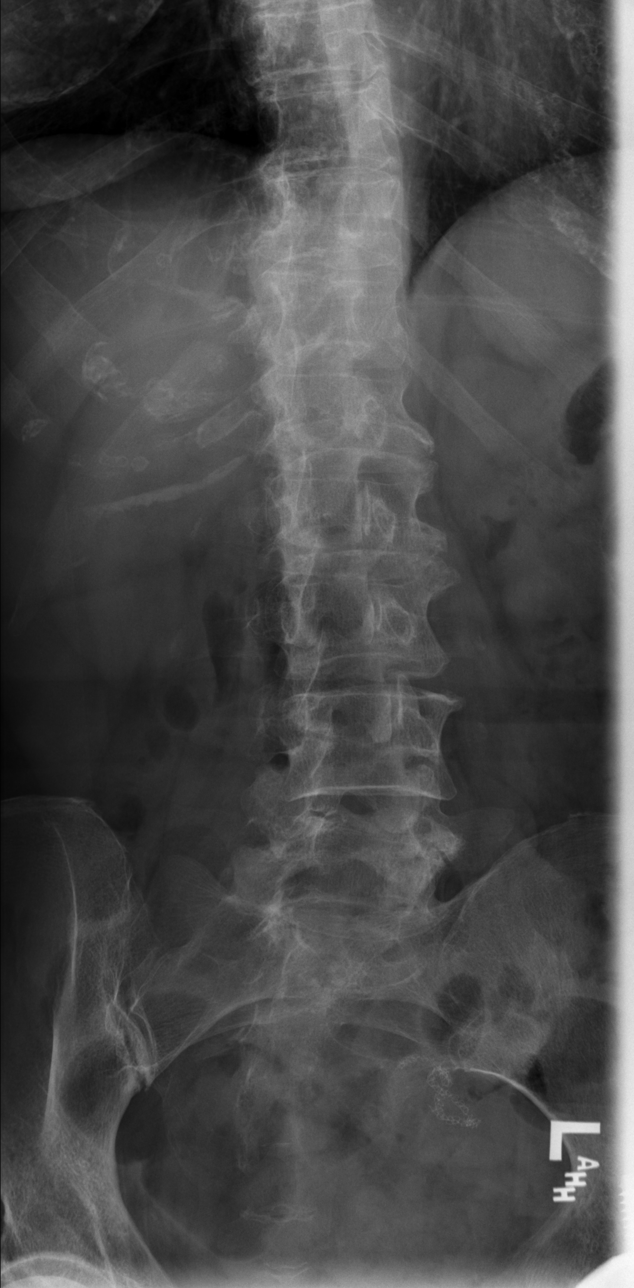

[t lumbar spine lat]
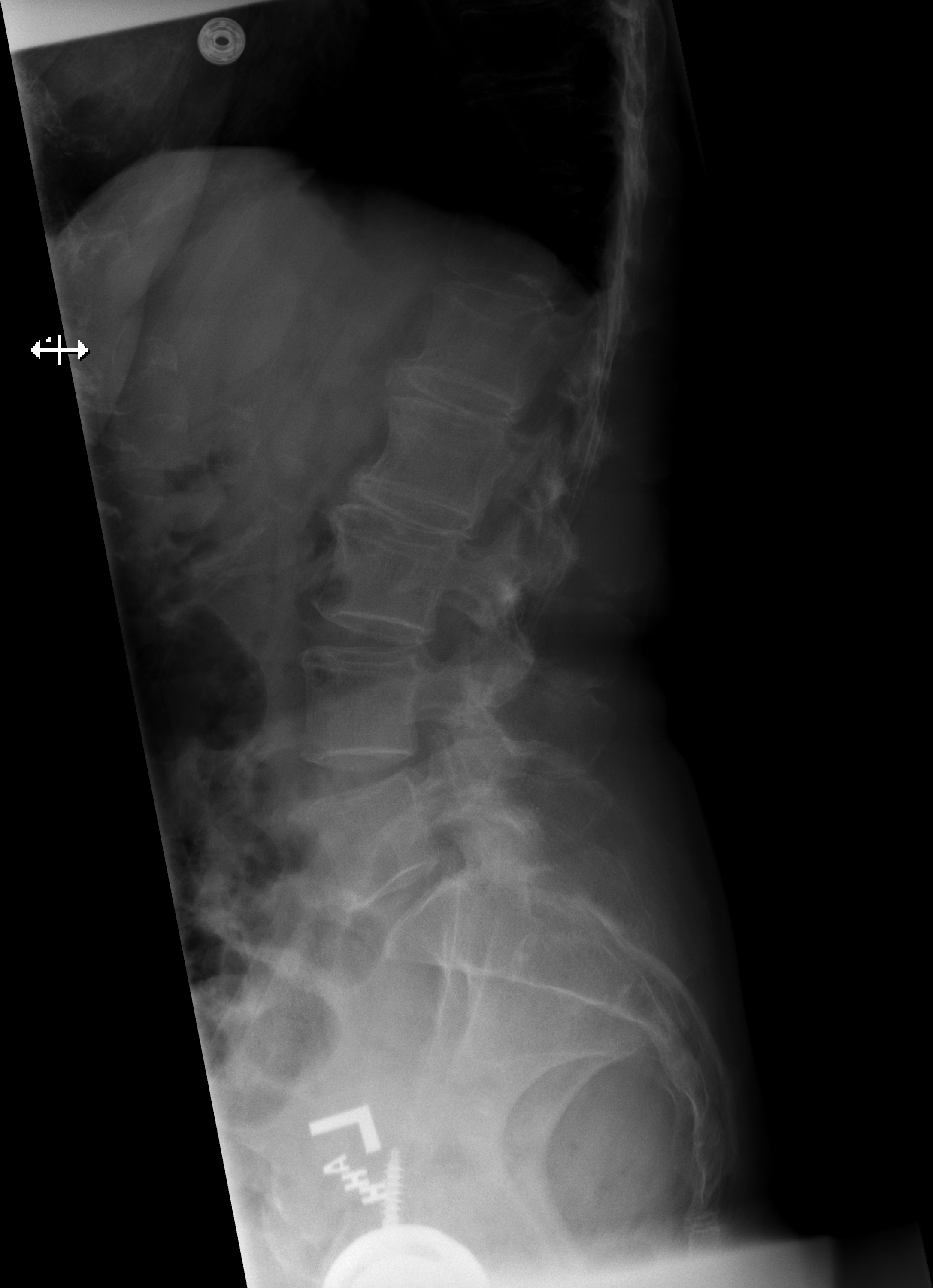

[t lumbar l-5 s-1 spot]
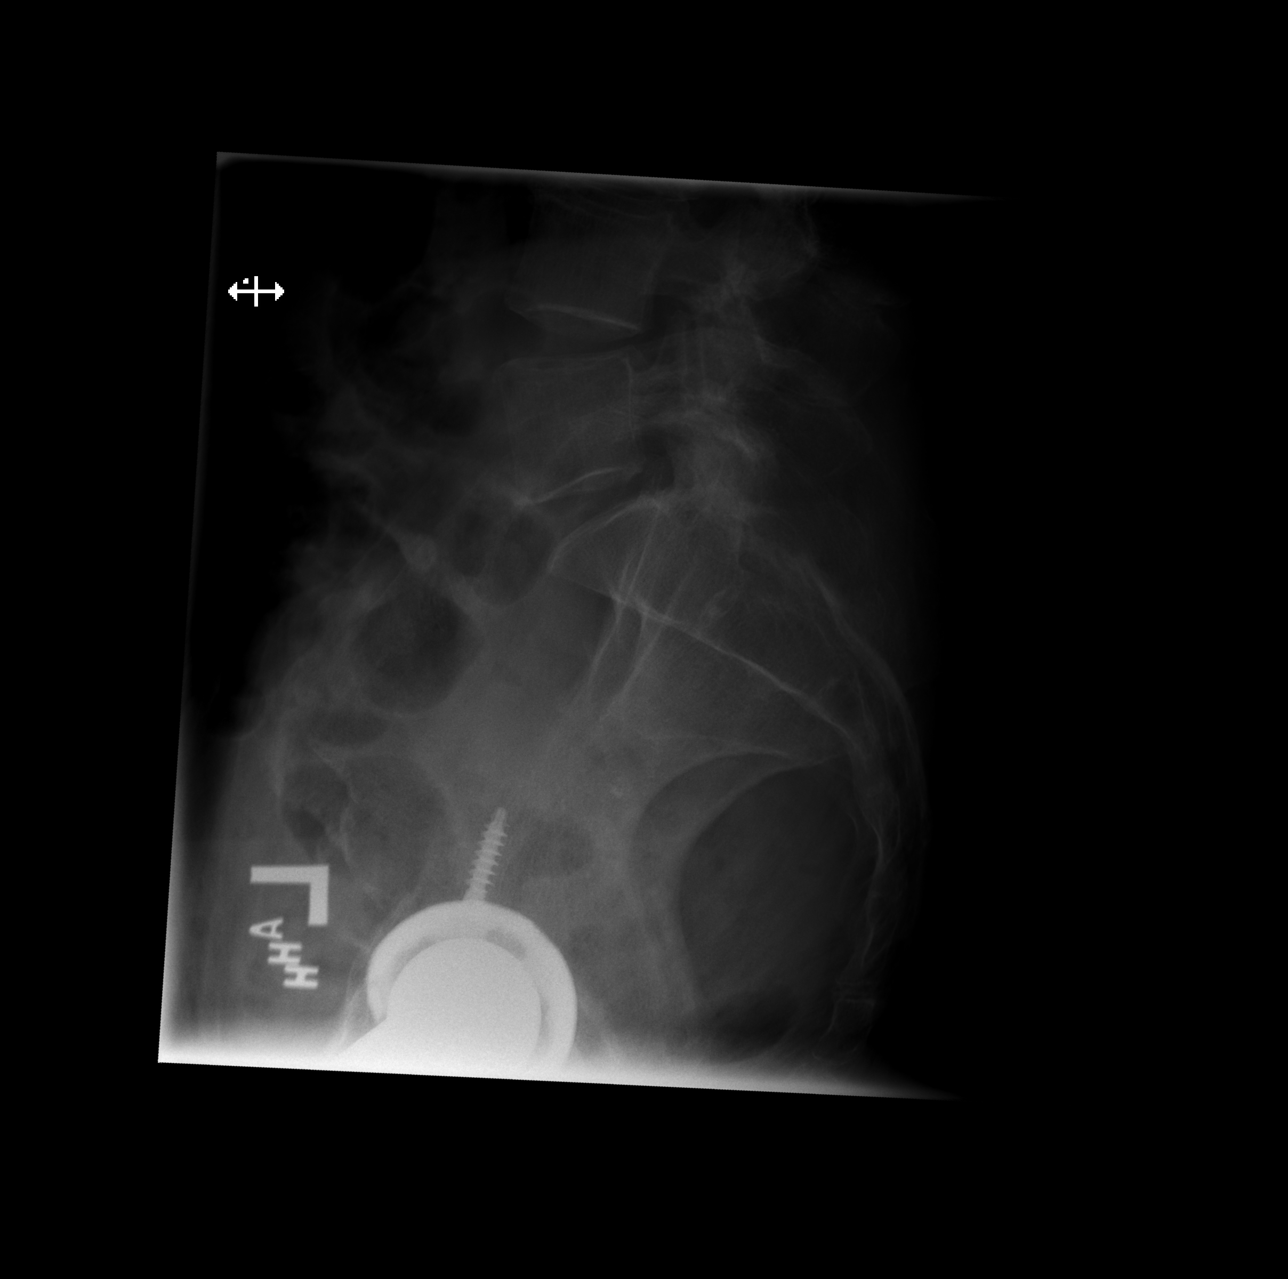

[5 of 5 positions shown; findings below may reference images not displayed]

FINDINGS: Vertebral body height and alignment are maintained. There is some
anterior endplate spurring at L2-3 and L3-4. Lower lumbar facet
degenerative change is seen. Left hip replacement is partially
imaged.
IMPRESSION: No acute abnormality.

Mild lumbar degenerative disease.

## 2019-06-16 IMAGING — CT CT ANGIO CHEST
2 of 7 series · 18 of 46 positions shown · IV contrast (APPLIED)
Comparison: 05/18/2017 chest x-ray.  12/19/2014 chest CT.

CLINICAL DATA: 66-year-old female with chest and back pain starting
yesterday followed by shortness of breath. Elevated D-dimer. Initial
encounter.

EXAM:
CT ANGIOGRAPHY CHEST WITH CONTRAST
TECHNIQUE: Multidetector CT imaging of the chest was performed using the
standard protocol during bolus administration of intravenous
contrast. Multiplanar CT image reconstructions and MIPs were
obtained to evaluate the vascular anatomy.
CONTRAST:  100 cc Isovue 370.

[Series 9: thins · axial · 0.53mm/px · z∈[+1138,+1394]mm · 15 of 412 slices shown]
[im 23/412  lung]
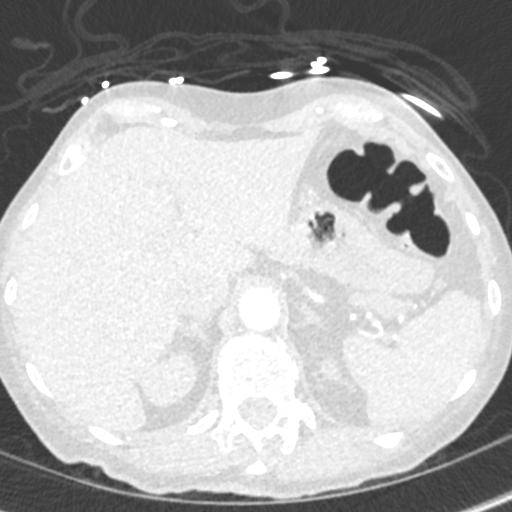
[im 46/412  soft-tissue]
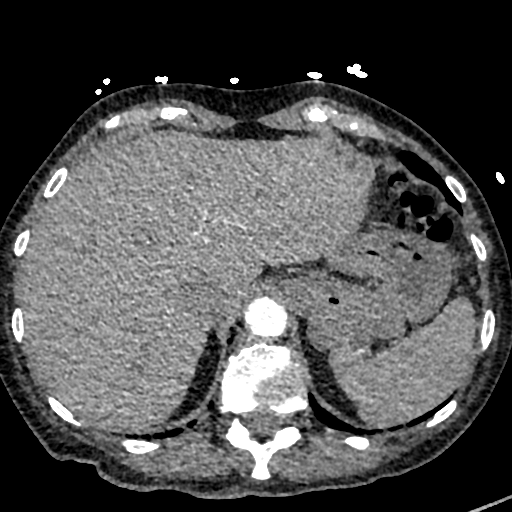
[im 69/412  lung]
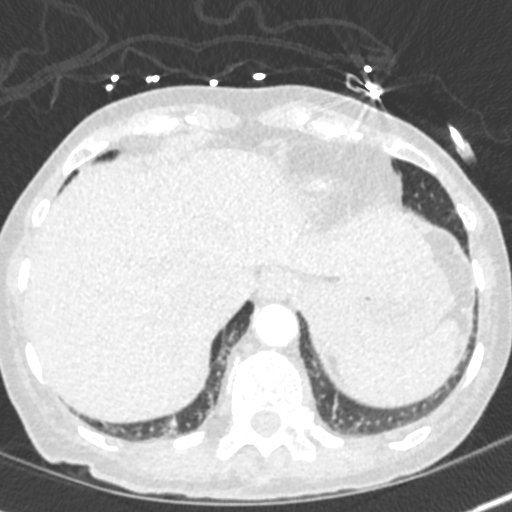
[im 92/412  soft-tissue]
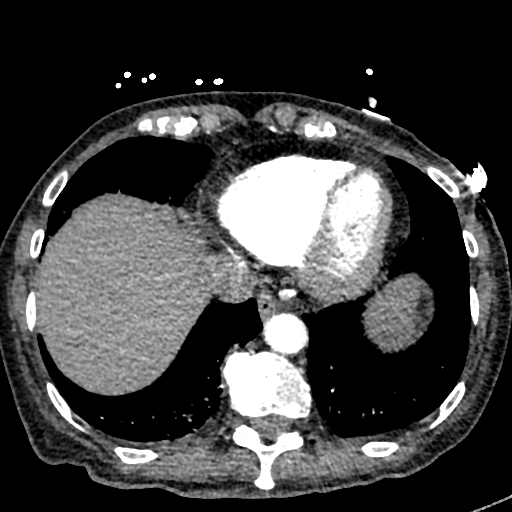
[im 138/412  lung]
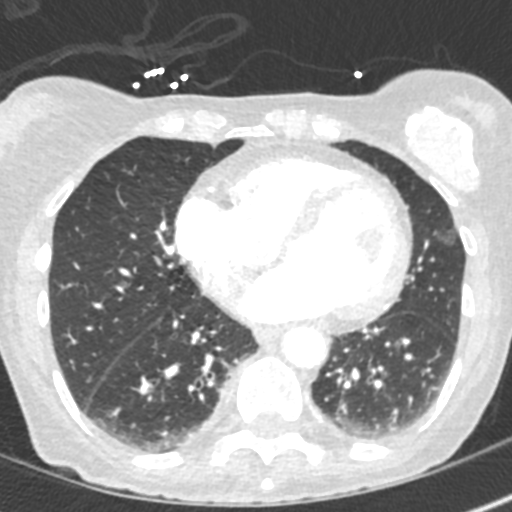
[im 160/412  soft-tissue]
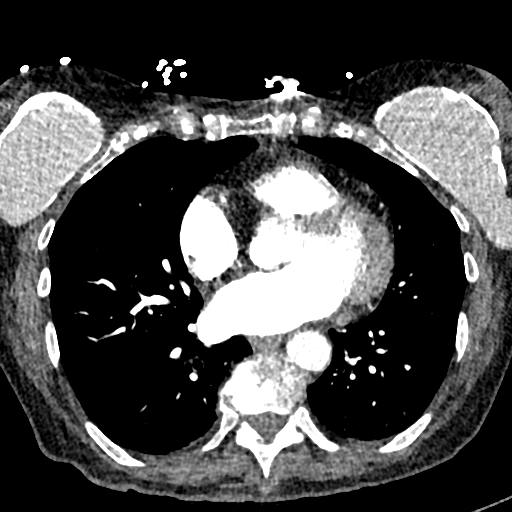
[im 183/412  lung]
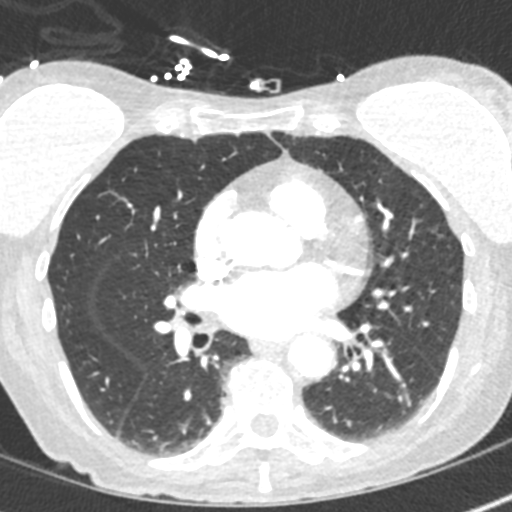
[im 206/412  soft-tissue]
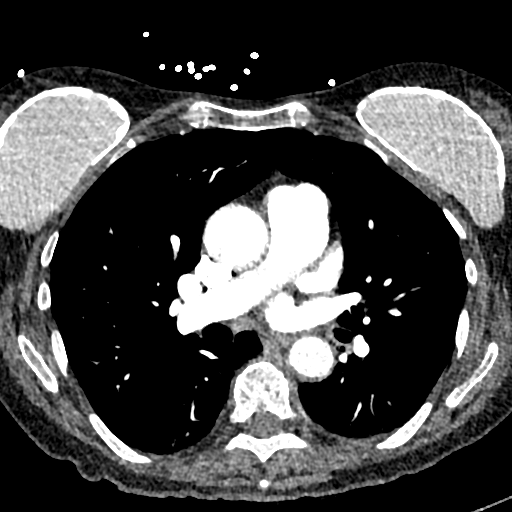
[im 229/412  lung]
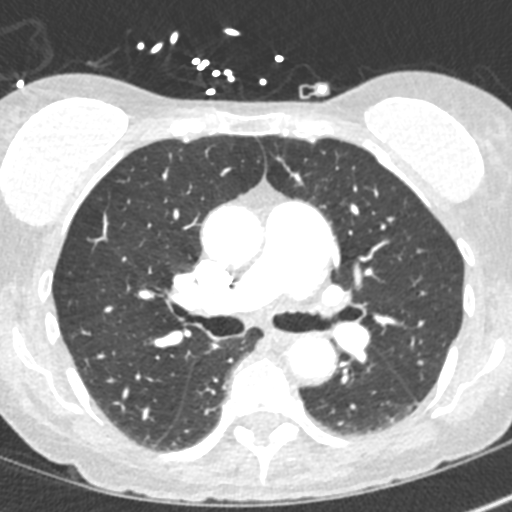
[im 252/412  soft-tissue]
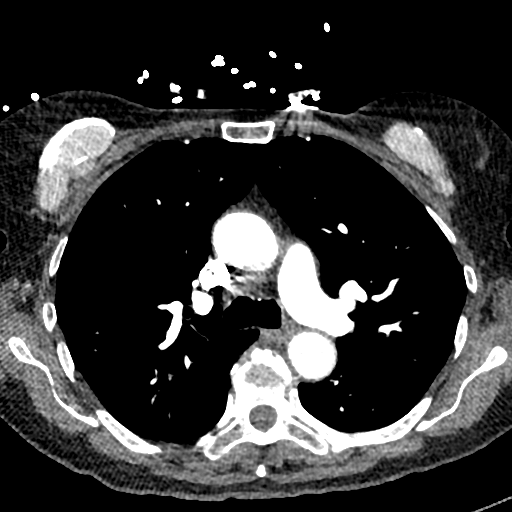
[im 275/412  lung]
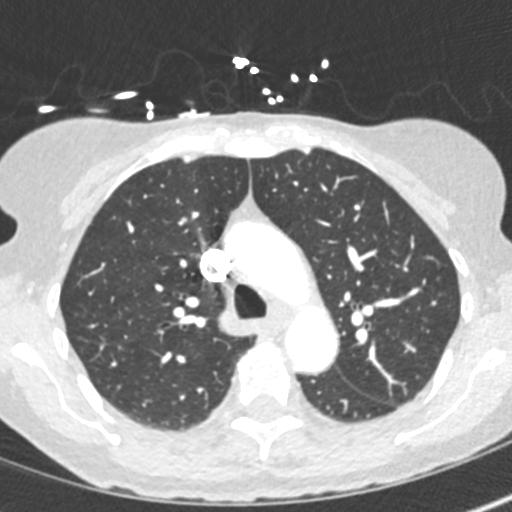
[im 320/412  soft-tissue]
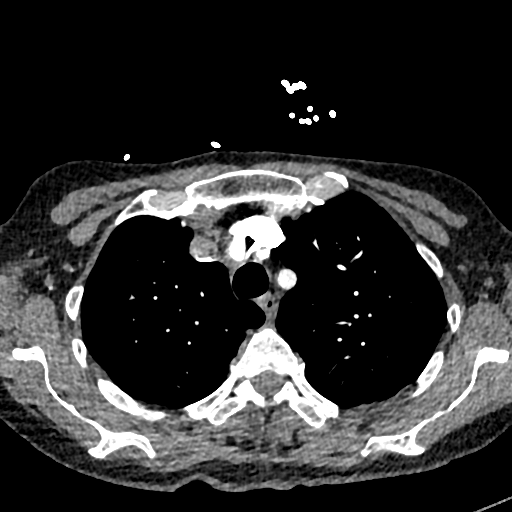
[im 343/412  lung]
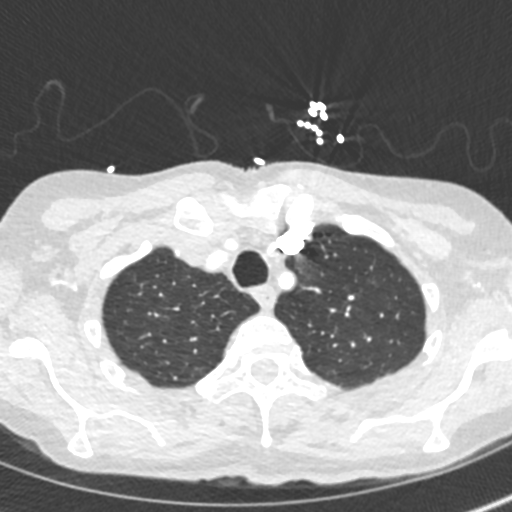
[im 366/412  soft-tissue]
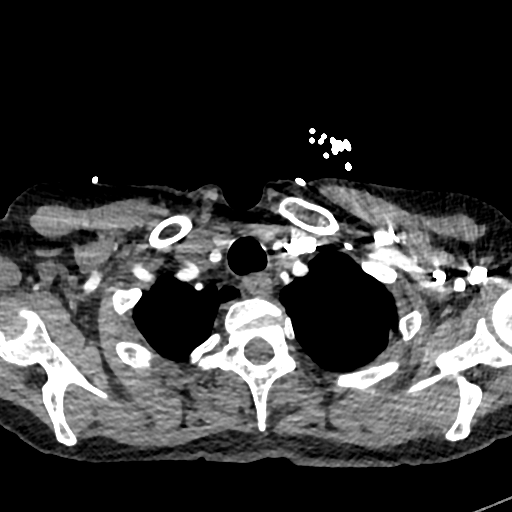
[im 389/412  lung]
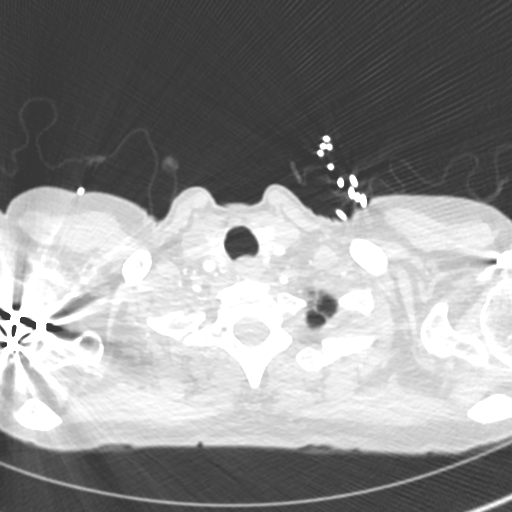

[Series 10: cor · coronal · 0.55mm/px · 3 of 151 slices shown]
[im 38/151  soft-tissue]
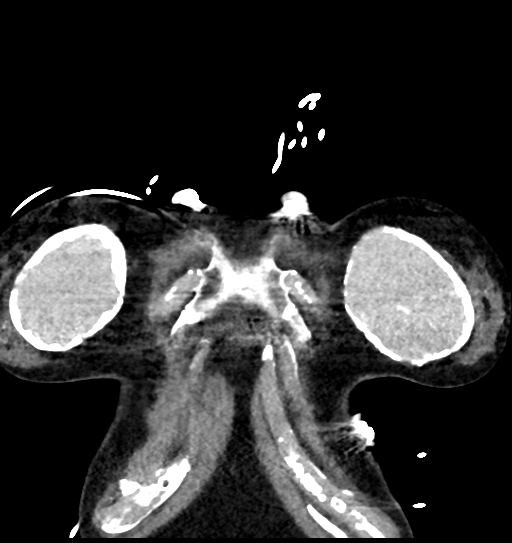
[im 76/151  soft-tissue]
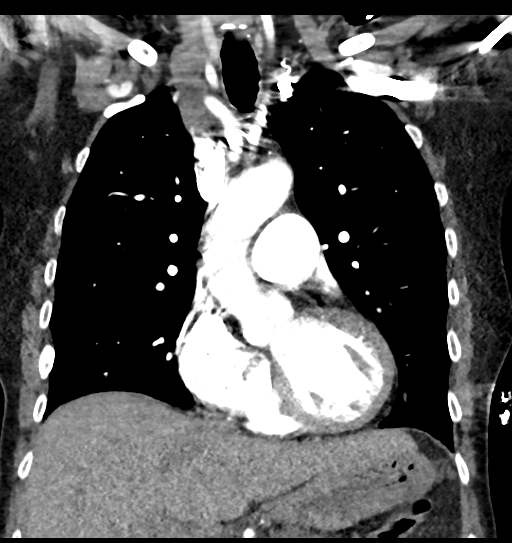
[im 113/151  soft-tissue]
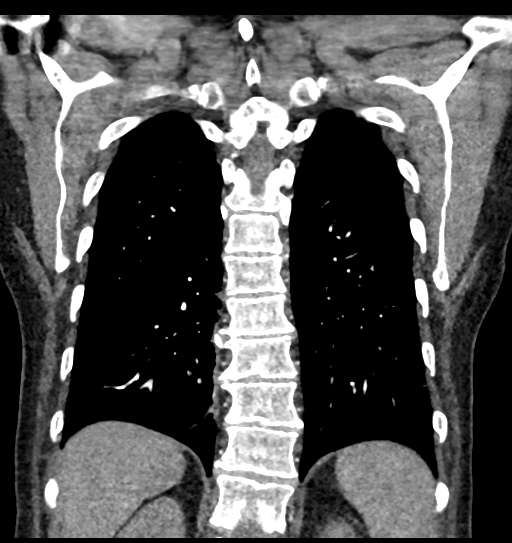

[18 of 46 positions shown; findings below may reference images not displayed]

FINDINGS: Cardiovascular: No pulmonary embolus or aortic dissection noted.
Prominent main pulmonary artery may represent a component of
pulmonary hypertension.

Top-normal slightly to large heart.

Mediastinum/Nodes: No mediastinal, hilar are or axillary adenopathy.

Lungs/Pleura: Basilar subsegmental atelectasis/ scarring. Minimal
peribronchial thickening. Lingula small area of mucoid impaction.
Trachea and mainstem bronchi are patent.

Upper Abdomen: No free air or worrisome abnormality.

Musculoskeletal: Mild curvature thoracic spine with superimposed
mild degenerative changes with minimal Schmorl's node deformities
but without focal compression fracture or osseous destructive
lesion.

Breast implants with coarse calcifications. Breast parenchyma
incompletely assessed.

Review of the MIP images confirms the above findings.
IMPRESSION: No pulmonary embolus or aortic dissection noted. Prominent main
pulmonary artery may represent a component of pulmonary
hypertension.

Top-normal to slightly large heart.

Basilar subsegmental atelectasis/ scarring. Minimal peribronchial
thickening. Lingula small area of mucoid impaction.

Mild curvature thoracic spine with superimposed mild degenerative
changes with minimal Schmorl's node deformities but without focal
compression fracture or osseous destructive lesion.

## 2019-06-16 IMAGING — CR DG CHEST 2V
2 series · 2 of 2 positions shown · non-contrast
Comparison: 03/31/2017 chest radiograph

CLINICAL DATA: 66 y/o  F; left upper posterior chest pain.

EXAM:
CHEST  2 VIEW

[chest pa]
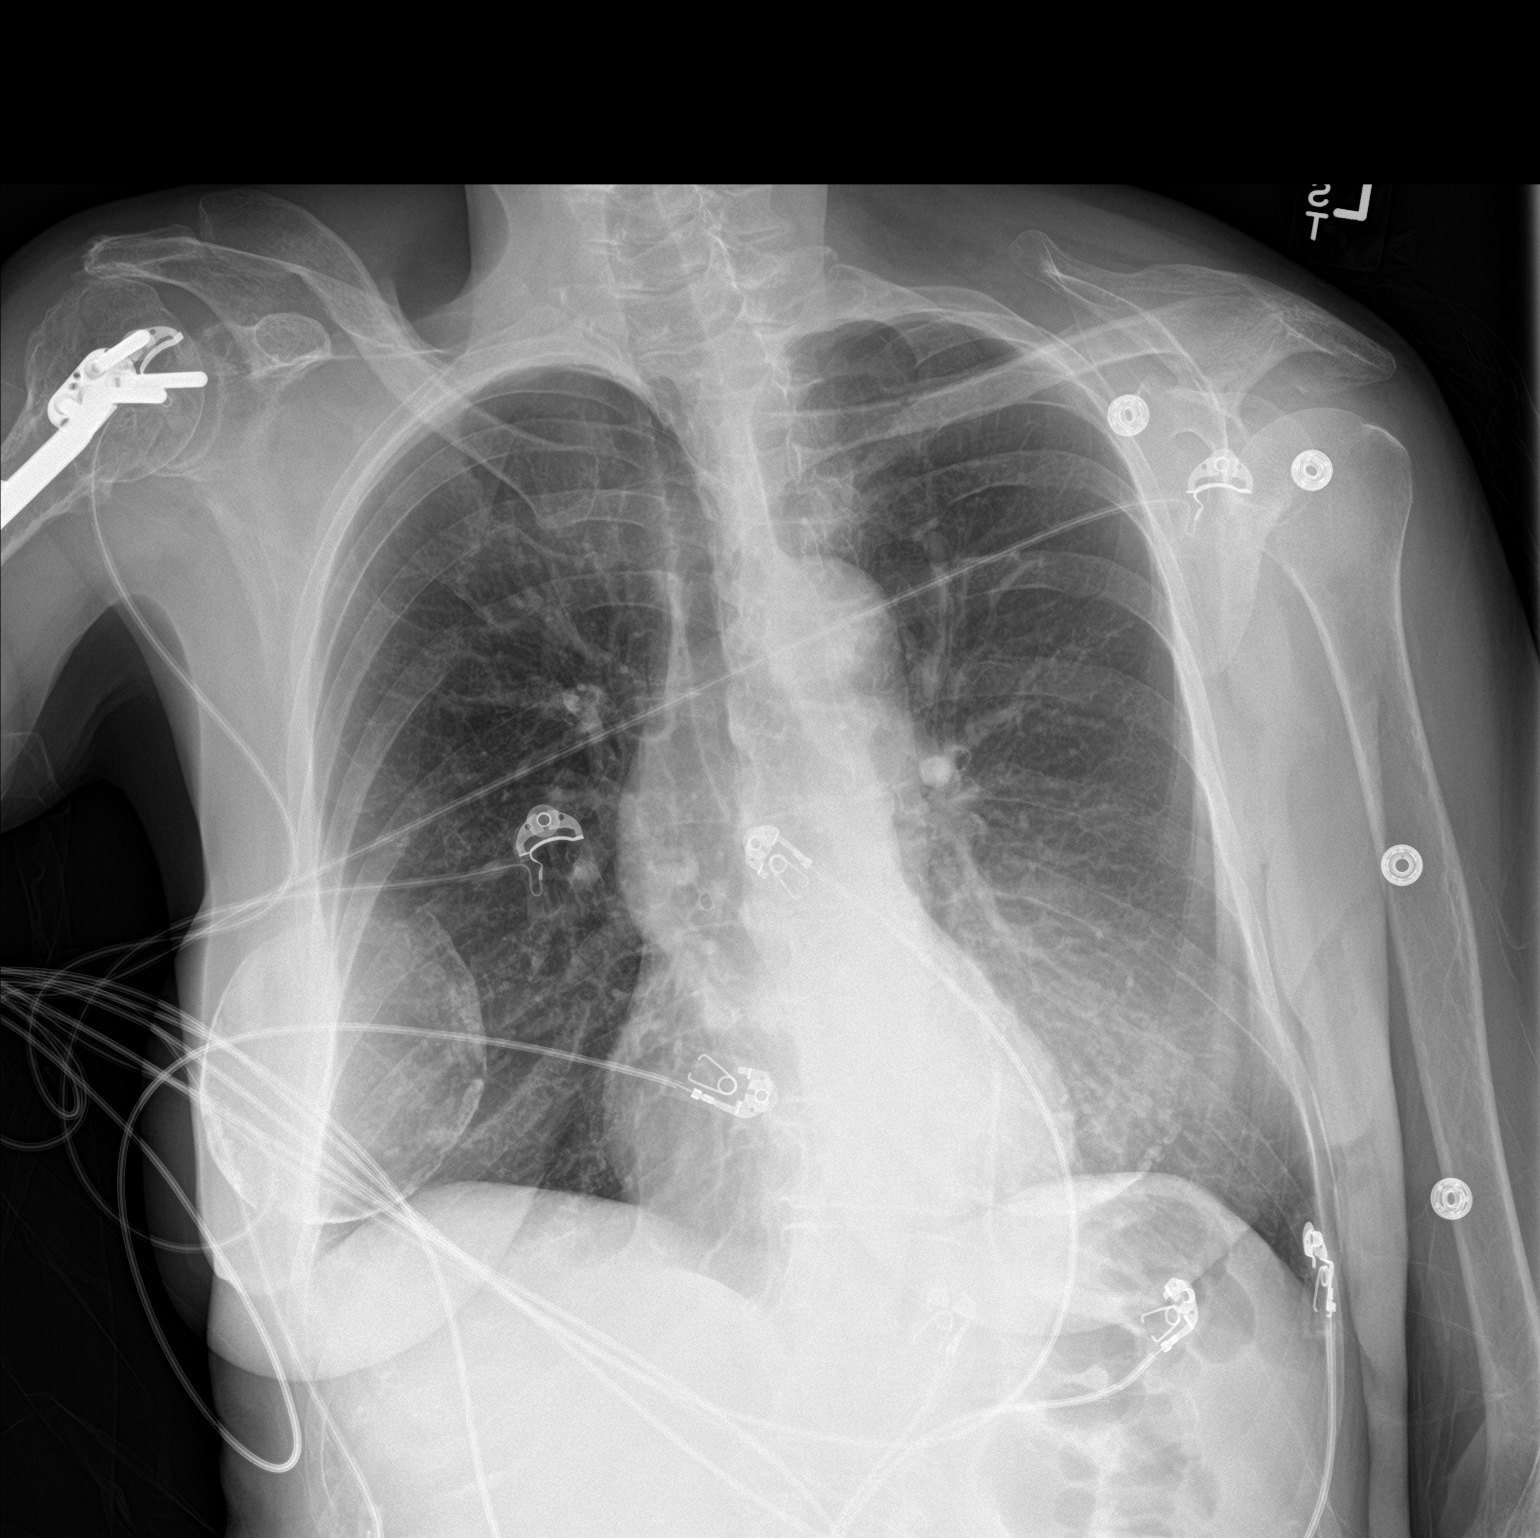

[chest lat]
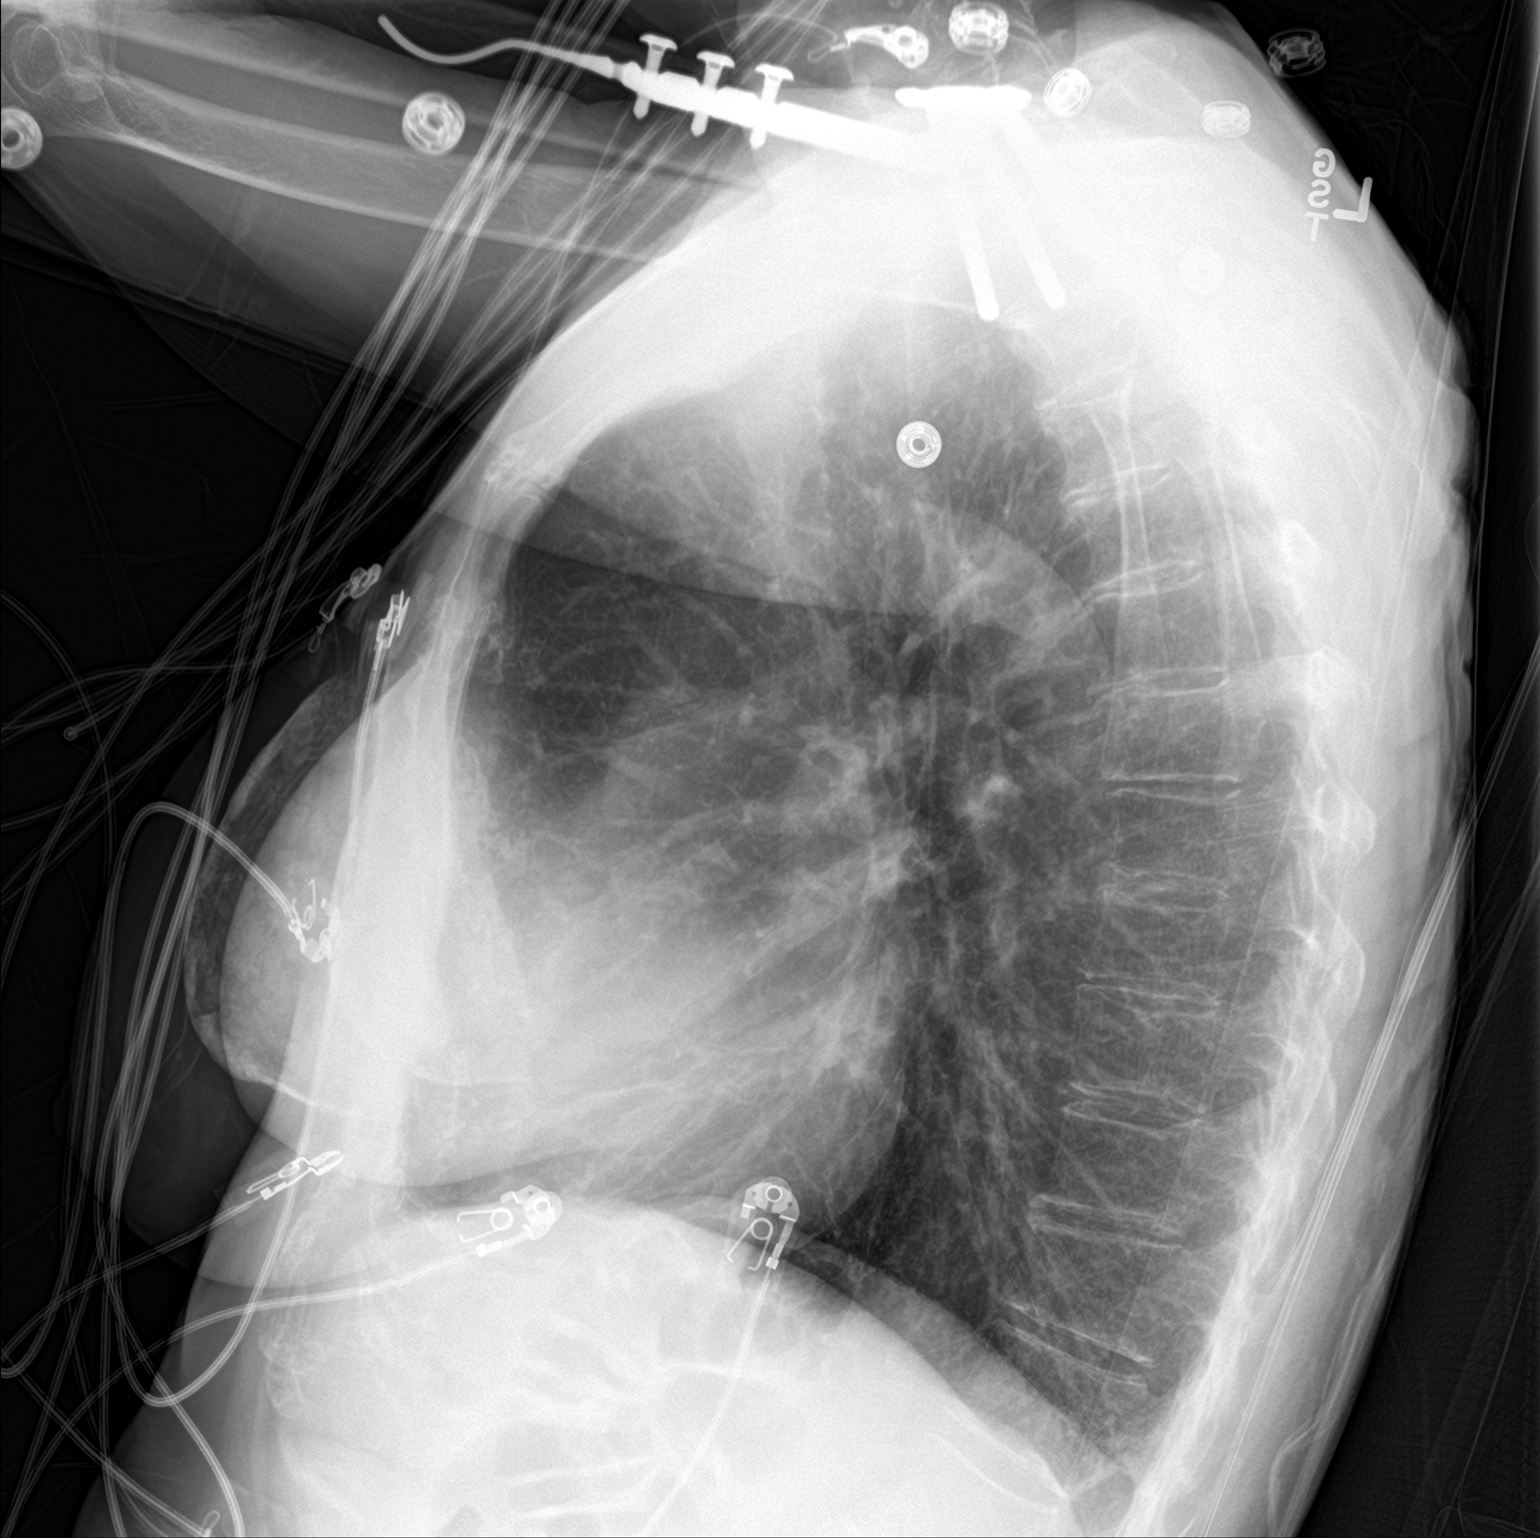

[2 of 2 positions shown; findings below may reference images not displayed]

FINDINGS: The heart size and mediastinal contours are within normal limits.
Both lungs are clear. No acute osseous abnormality identified.
Bilateral breast prostheses. Right proximal humerus fixation
hardware partially visualized.
IMPRESSION: No acute pulmonary process identified.

By: Gregoria Flash M.D.

## 2019-07-28 IMAGING — DX DG CHEST 2V
2 series · 2 of 2 positions shown · non-contrast
Comparison: CTA chest 05/18/2017 and earlier.

CLINICAL DATA: 66-year-old female with weakness dizziness and
shortness of breath for 3 weeks. Nausea and headache for multiple
days.

EXAM:
CHEST  2 VIEW

[chest lat]
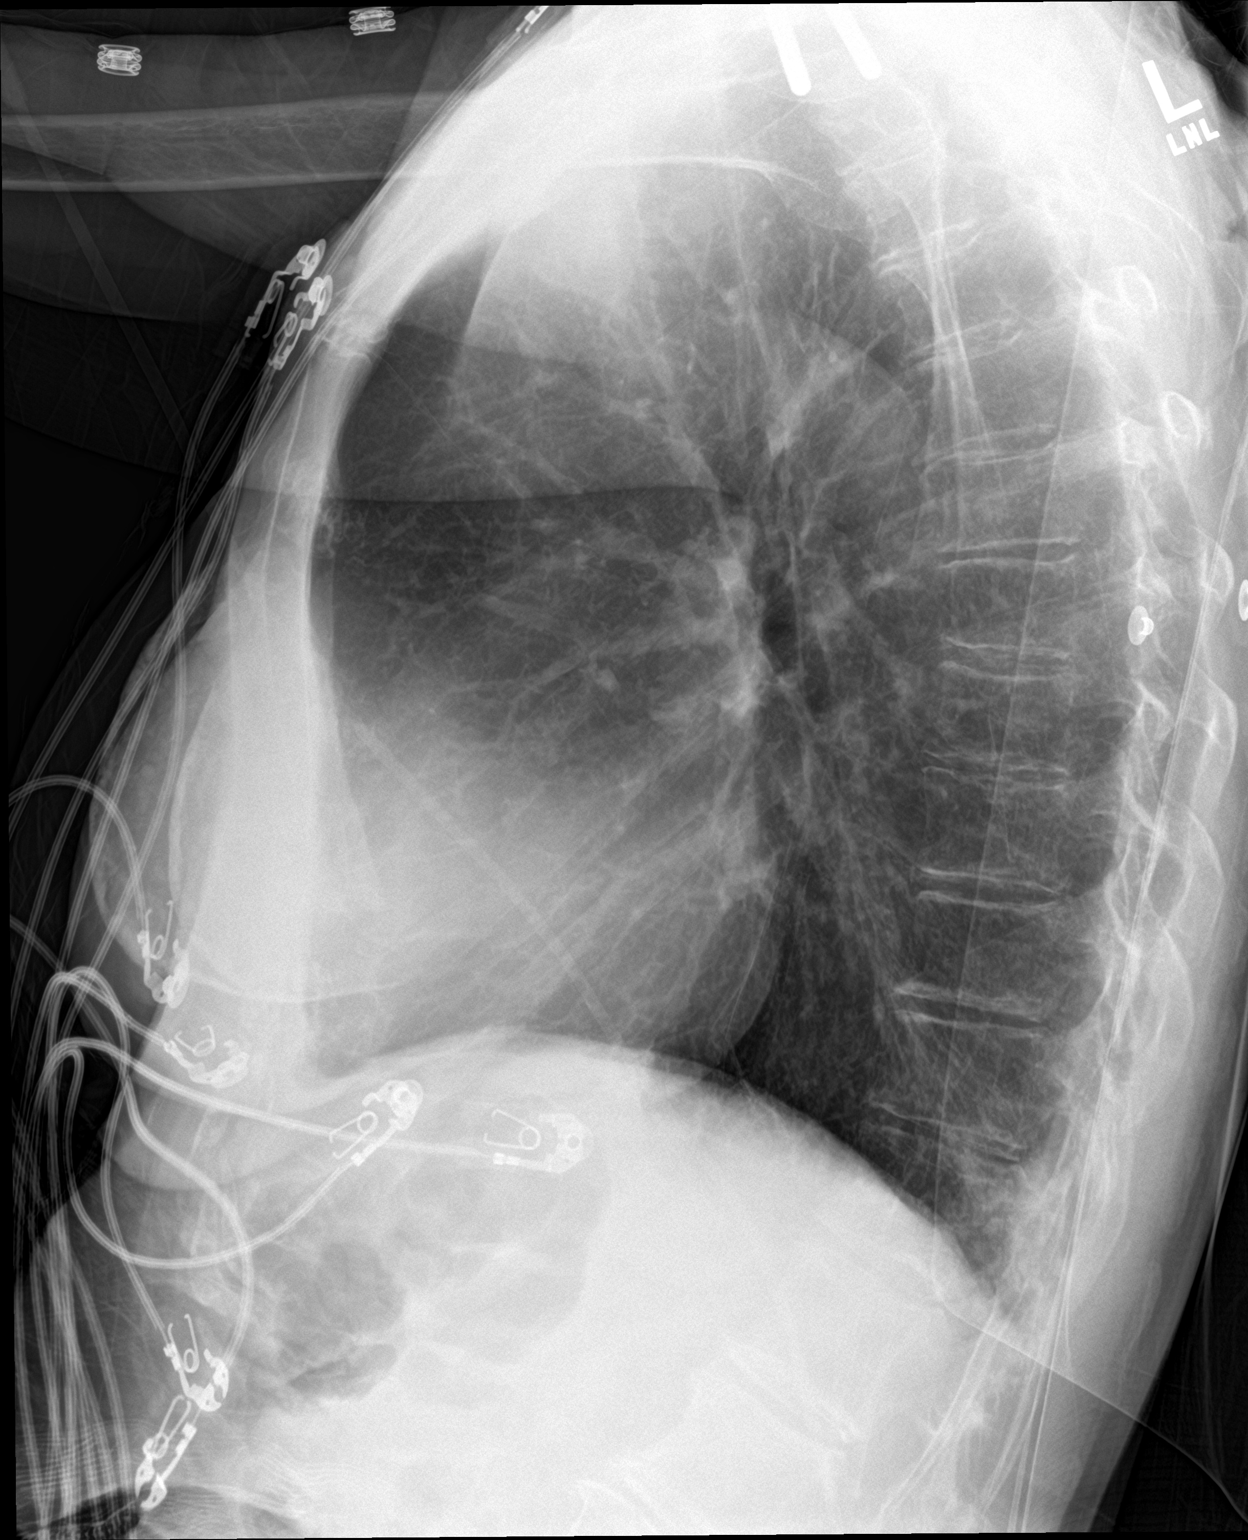

[chest ap]
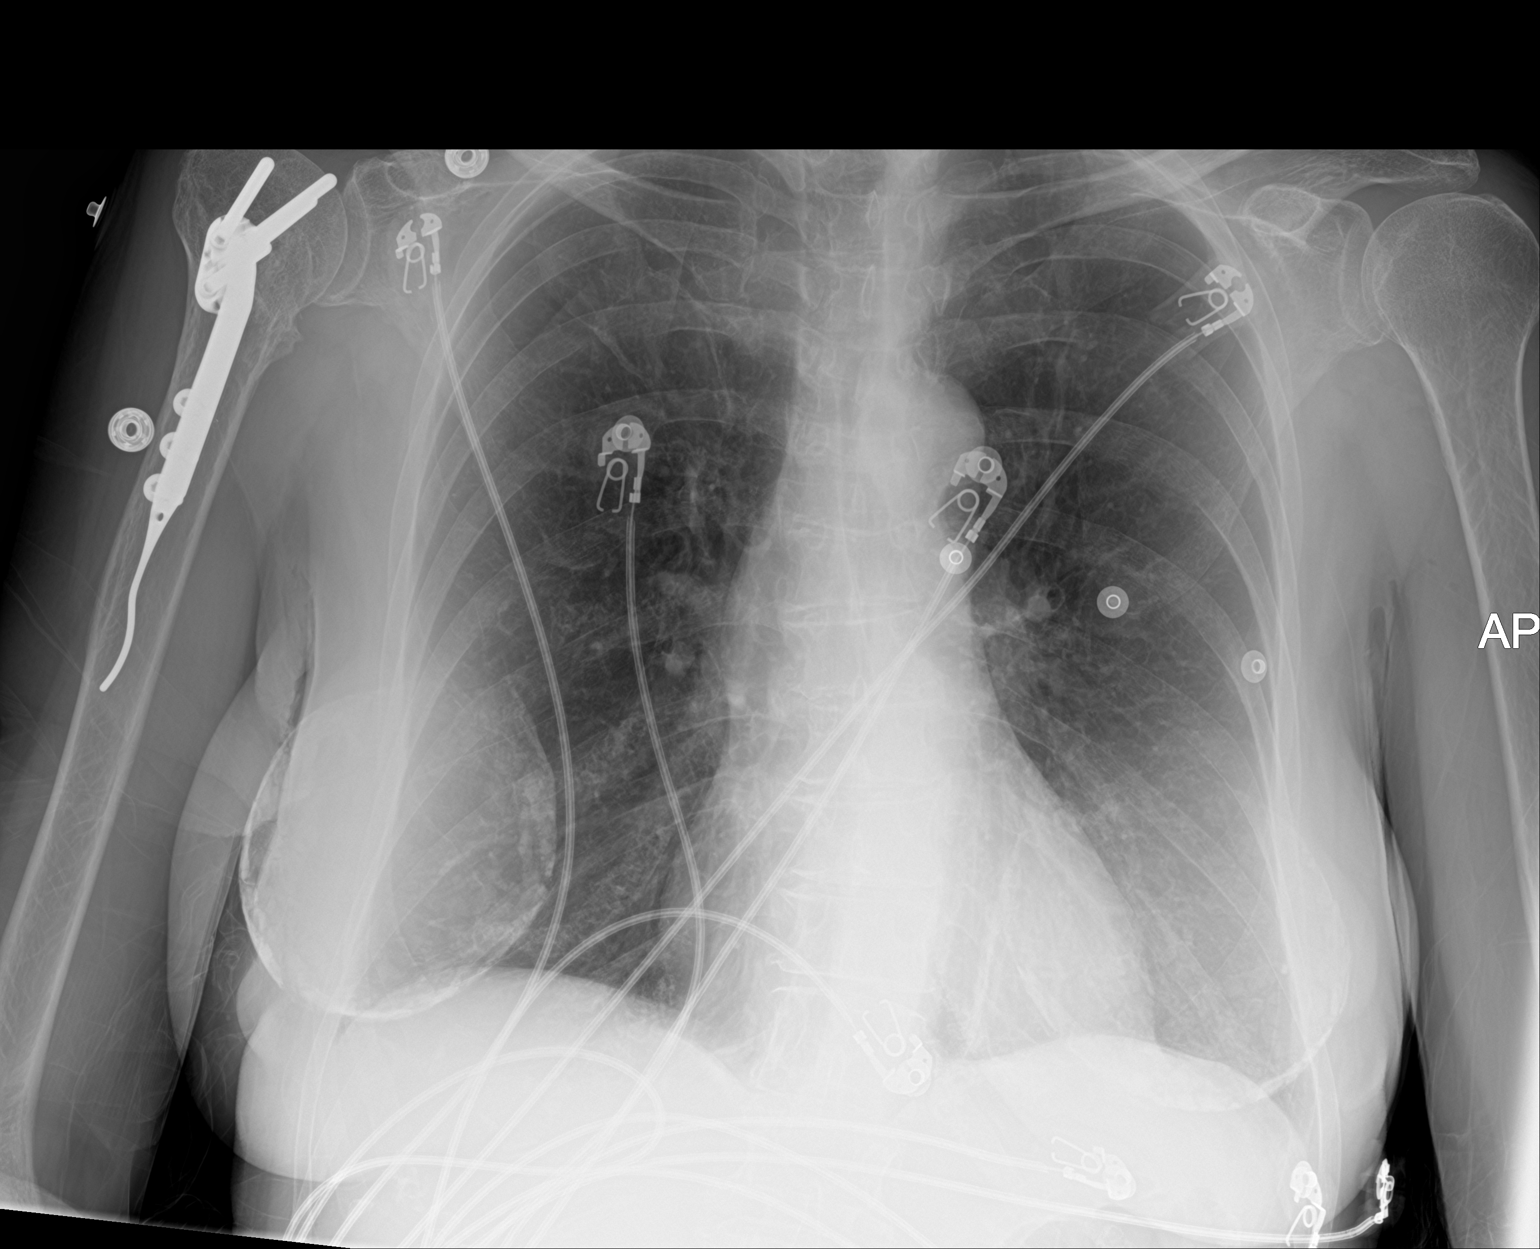

[2 of 2 positions shown; findings below may reference images not displayed]

FINDINGS: AP and lateral views of the chest. Stable lung volumes. Stable mild
cardiomegaly. Other mediastinal contours are within normal limits.
Visualized tracheal air column is within normal limits. No
pneumothorax, pulmonary edema, pleural effusion or acute pulmonary
opacity. Stable visualized osseous structures. Postoperative changes
to the proximal right humerus. Calcified breast implants. Negative
visible bowel gas pattern.
IMPRESSION: No acute cardiopulmonary abnormality.

## 2019-08-11 DIAGNOSIS — M5412 Radiculopathy, cervical region: Secondary | ICD-10-CM | POA: Insufficient documentation

## 2019-09-18 ENCOUNTER — Ambulatory Visit: Payer: Medicare Other

## 2019-09-23 ENCOUNTER — Ambulatory Visit: Payer: Medicare Other

## 2019-09-30 ENCOUNTER — Ambulatory Visit: Payer: Medicare Other

## 2019-10-06 ENCOUNTER — Ambulatory Visit: Payer: Self-pay

## 2019-10-06 ENCOUNTER — Encounter: Payer: Self-pay | Admitting: Orthopaedic Surgery

## 2019-10-06 ENCOUNTER — Ambulatory Visit: Payer: Medicare Other | Attending: Internal Medicine

## 2019-10-06 ENCOUNTER — Ambulatory Visit (INDEPENDENT_AMBULATORY_CARE_PROVIDER_SITE_OTHER): Payer: Medicare Other | Admitting: Orthopaedic Surgery

## 2019-10-06 ENCOUNTER — Other Ambulatory Visit: Payer: Self-pay

## 2019-10-06 VITALS — Ht 65.0 in | Wt 120.0 lb

## 2019-10-06 DIAGNOSIS — M545 Low back pain, unspecified: Secondary | ICD-10-CM

## 2019-10-06 DIAGNOSIS — G8929 Other chronic pain: Secondary | ICD-10-CM

## 2019-10-06 NOTE — Progress Notes (Signed)
Office Visit Note   Patient: Dorothy White           Date of Birth: [birth year]           MRN: [id number] Visit Date: 10/06/2019              Requested by: Maurice Small, MD [address],  [place] [address] PCP: Maurice Small, MD   Assessment & Plan: Visit Diagnoses:  1. Chronic left-sided low back pain, unspecified whether sciatica present     Plan: My impression is chronic low back pain.  X-rays are negative for acute abnormalities.  Clinically her left leg is longer but radiographically her leg lengths are equal therefore I think the leg length discrepancy is coming from her back.  I have given her a prescription to [facility] for heel lift.  I offered her a Toradol injection which she agreed to today.  I recommended that she ask for stronger pain killers from her chronic pain doctor since it sounds like 10 mg of oxycodone is not enough currently.  Questions encouraged and answered.  Follow-up as needed.  Follow-Up Instructions: Return if symptoms worsen or fail to improve.   Orders:  Orders Placed This Encounter  Procedures  . XR Lumbar Spine 2-3 Views  . MR Lumbar Spine w/o contrast   No orders of the defined types were placed in this encounter.     Procedures: No procedures performed   Clinical Data: No additional findings.   Subjective: Chief Complaint  Patient presents with  . Lower Back - Pain    Dorothy White comes in today for evaluation of severe low back pain.  She is in chronic pain management with Dr. Nelva Bush at [facility].  She states that she has constant pain in her back that is worse with standing or getting up.  She takes oxycodone 10 mg 4 times a day.  She denies any numbness tingling or weakness in her legs.  The pain is slightly more on the left side of her low back.  Denies any bowel or bladder dysfunction.  She took a prednisone Dosepak last week without any relief.   Review of Systems  Constitutional: Negative.     HENT: Negative.   Eyes: Negative.   Respiratory: Negative.   Cardiovascular: Negative.   Endocrine: Negative.   Musculoskeletal: Negative.   Neurological: Negative.   Hematological: Negative.   Psychiatric/Behavioral: Negative.   All other systems reviewed and are negative.    Objective: Vital Signs: Ht 5\' 5"  (1.651 m)   Wt 120 lb (54.4 kg)   BMI 19.97 kg/m   Physical Exam Vitals and nursing note reviewed.  Constitutional:      Appearance: She is well-developed.  Pulmonary:     Effort: Pulmonary effort is normal.  Skin:    General: Skin is warm.     Capillary Refill: Capillary refill takes less than 2 seconds.  Neurological:     Mental Status: She is alert and oriented to person, place, and time.  Psychiatric:        Behavior: Behavior normal.        Thought Content: Thought content normal.        Judgment: Judgment normal.     Ortho Exam Low back exam shows tenderness throughout the low back across the spine and the paraspinal muscles.  Normal reflexes.  No focal motor or sensory deficits.  Clinically left leg is longer than the right. Specialty Comments:  No specialty  comments available.  Imaging: XR Lumbar Spine 2-3 Views  Result Date: 10/06/2019 No acute or structural abnormalities.  Lumbar spondylosis.    PMFS History: Patient Active Problem List   Diagnosis Date Noted  . Protein-calorie malnutrition, severe 07/04/2018  . UTI (urinary tract infection) 07/03/2018  . Nasal bone fractures 07/03/2018  . Knee pain 07/03/2018  . Normal anion gap metabolic acidosis [id number]  . Subdural hematoma ([place]) 07/02/2018  . Recurrent falls 04/16/2017  . 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 At risk for adverse drug event 04/07/2017  . Acute blood loss as cause of postoperative anemia 04/04/2017  . Acute delirium 04/04/2017  . Osteoporosis 04/02/2017  . Closed fracture of neck of left femur ([place]) 03/31/2017  . Scalp contusion 03/31/2017  . Fall  at home, initial encounter 03/31/2017  . Hip fracture ([place]) 03/31/2017  . 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 Hx of transient ischemic attack (TIA) 02/19/2017  . Left carotid stenosis- 40-59% 02/19/2017  . Protein calorie malnutrition ([place]) 02/18/2017  . Falls 02/17/2017  . Chest pain 09/26/2016  . Diverticulitis of sigmoid colon 04/25/2016  . Infection due to ESBL-producing Escherichia coli/Diverticular Abscess 03/15/2016  . Diverticulitis 03/12/2016  . Chronic pain syndrome 03/12/2016  . Lesion of pancreas 03/12/2016  . History of chest pain 03/12/2016  . Hypokalemia 03/12/2016  . 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 TIA (transient ischemic attack) 12/26/2015  . DOE (dyspnea on exertion) 12/26/2015  . Right sided weakness 12/17/2015  . Ataxia 12/17/2015  . Shoulder fracture 02/15/2015  . Chronic headache 10/11/2013  . Weakness generalized 08/12/2013  . Dizziness and giddiness 08/12/2013  . Abnormality of gait 07/11/2013  . Hyperlipidemia 05/07/2007  . Migraine 05/07/2007  . PREMATURE VENTRICULAR CONTRACTIONS, FREQUENT 05/07/2007  . GERD (gastroesophageal reflux disease) 05/07/2007  . DIVERTICULOSIS, COLON 05/07/2007  . 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 OSTEOPENIA 05/07/2007  . Depression 03/24/2007  . HEMORRHOIDS 03/24/2007  . ALLERGIC RHINITIS 03/24/2007  . LOW BACK PAIN 03/24/2007  . MIGRAINES, HX OF 03/24/2007   Past Medical History:  Diagnosis Date  . Anemia    ?  Marland Kitchen Anxiety   . Arthritis    "everywhere" (04/25/2016)  . 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 Chronic back pain    "all over" (04/25/2016)  . Constipation   . Depression   . Diverticulitis   . GERD (gastroesophageal reflux disease)   . Headache    "weekly" (04/25/2016)  . 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 HLD (hyperlipidemia)    hx (04/25/2016)  . Migraine    "3/wk sometimes; other times weekly;  recently had Hemiplegic migraine" (04/25/2016)  . Mild CAD    a. 25% mLAD, otherwise no sig disease 01/2016 cath.  Marland Kitchen NICM (nonischemic cardiomyopathy) ([place])    a. EF 40-45% by echo 123456 at time of complicated migraine/neuro sx, 55-65% at time of cath 01/2016  . Stroke [facility]) 06/2013   "mini" stroke , ?possibly hemaplegic migraine per pt    Family History  Problem Relation Age of Onset  . Lung cancer Mother 52  . Migraines Mother   . Heart attack Father        Vague  . Hypertension Brother   . Esophageal cancer Brother   . Diabetes Paternal Grandmother        questionable  . Colon cancer Neg Hx   . Kidney disease Neg Hx   .  Liver disease Neg Hx     Past Surgical History:  Procedure Laterality Date  . ANTERIOR APPROACH HEMI HIP ARTHROPLASTY Left 04/01/2017   Procedure: LEFT DIRECT ANTERIOR TOTAL HIP REPLACEMENT;  Surgeon: Leandrew Koyanagi, MD;  Location: [facility];  Service: Orthopedics;  Laterality: Left;  LEFT DIRECT ANTERIOR TOTAL HIP REPLACEMENT  . AUGMENTATION MAMMAPLASTY  1980  . BASAL CELL CARCINOMA EXCISION     "tip of my nose"  . BUNIONECTOMY Bilateral 10/2003  . CARDIAC CATHETERIZATION N/A 02/20/2016   Procedure: Left Heart Cath and Coronary Angiography;  Surgeon: Jettie Booze, MD;  Location: [facility];  Service: Cardiovascular;  Laterality: N/A;  . COLONOSCOPY    . DILATION AND CURETTAGE OF UTERUS    . FRACTURE SURGERY    . IR GENERIC HISTORICAL  03/18/2016   IR SINUS/FIST TUBE CHK-NON GI 03/18/2016 Aletta Edouard, MD [facility]  . LAPAROSCOPIC SIGMOID COLECTOMY N/A 04/25/2016   Procedure: LAPAROSCOPIC SIGMOID COLECTOMY;  Surgeon: Stark Klein, MD;  Location: [facility];  Service: General;  Laterality: N/A;  . OOPHORECTOMY Bilateral ~ 1999  . ORIF HUMERUS FRACTURE Right 02/15/2015   Procedure: OPEN REDUCTION INTERNAL FIXATION (ORIF) PROXIMAL HUMERUS FRACTURE;  Surgeon: Netta Cedars, MD;  Location: [facility];  Service: Orthopedics;  Laterality: Right;  . RHINOPLASTY  1976    . TONSILLECTOMY    . TUBAL LIGATION  ~ 1983  . VAGINAL HYSTERECTOMY  ~ 1998   Social History   Occupational History  . Occupation: retired    Fish farm manager: [employer]    Comment: Retired    Fish farm manager: [employer]  Tobacco Use  . Smoking status: Current Some Day Smoker    Packs/day: 1.00    Years: 34.00    Pack years: 34.00    Types: Cigarettes    Last attempt to quit: 02/03/2017    Years since quitting: 2.6  . Smokeless tobacco: Never Used  . Tobacco comment: 10 per week or less  Substance and Sexual Activity  . Alcohol use: Yes    Alcohol/week: 1.0 standard drinks    Types: 1 Standard drinks or equivalent per week    Comment: occ   . Drug use: No    Types: Cocaine    Comment: Denies any drug use 02/19/18  . Sexual activity: Never

## 2019-10-13 ENCOUNTER — Ambulatory Visit: Payer: Medicare Other | Attending: Internal Medicine

## 2019-10-13 DIAGNOSIS — Z23 Encounter for immunization: Secondary | ICD-10-CM

## 2019-10-13 NOTE — Progress Notes (Signed)
   Covid-19 Vaccination Clinic  Name:  Dorothy White    MRN: [id number] DOB: 1950/10/13  10/13/2019  [name] was observed post Covid-19 immunization for 15 minutes without incident. She was provided with Vaccine Information Sheet and instruction to access the V-Safe system.   Ms. Blot was instructed to call 911 with any severe reactions post vaccine: Marland Kitchen Difficulty breathing  . Swelling of face and throat  . A fast heartbeat  . A bad rash all over body  . Dizziness and weakness   Immunizations Administered    Name Date Dose VIS Date Route   Pfizer COVID-19 Vaccine 10/13/2019 10:17 AM 0.3 mL 07/08/2019 Intramuscular   Manufacturer: [place]   Lot: [id number]   [place]: [id number]

## 2019-11-07 ENCOUNTER — Ambulatory Visit: Payer: Medicare Other | Attending: Internal Medicine

## 2019-11-07 DIAGNOSIS — Z23 Encounter for immunization: Secondary | ICD-10-CM

## 2019-11-07 NOTE — Progress Notes (Signed)
   Covid-19 Vaccination Clinic  Name:  Dorothy White    MRN: [id number] DOB: 11-14-1950  11/07/2019  [name] was observed post Covid-19 immunization for 15 minutes without incident. She was provided with Vaccine Information Sheet and instruction to access the V-Safe system.   [name] was instructed to call 911 with any severe reactions post vaccine: Marland Kitchen Difficulty breathing  . Swelling of face and throat  . A fast heartbeat  . A bad rash all over body  . Dizziness and weakness   Immunizations Administered    Name Date Dose VIS Date Route   Pfizer COVID-19 Vaccine 11/07/2019  9:44 AM 0.3 mL 07/08/2019 Intramuscular   Manufacturer: [place]   Lot: [id number]   [place]: [id number]

## 2019-11-09 ENCOUNTER — Other Ambulatory Visit: Payer: Medicare Other

## 2019-12-15 IMAGING — CR DG HIP (WITH OR WITHOUT PELVIS) 2-3V*L*
3 series · 3 of 3 positions shown · non-contrast
Comparison: March 31, 2017 and April 01, 2017

CLINICAL DATA: Pain following fall

EXAM:
DG HIP (WITH OR WITHOUT PELVIS) 2-3V LEFT

[t pelvis ap]
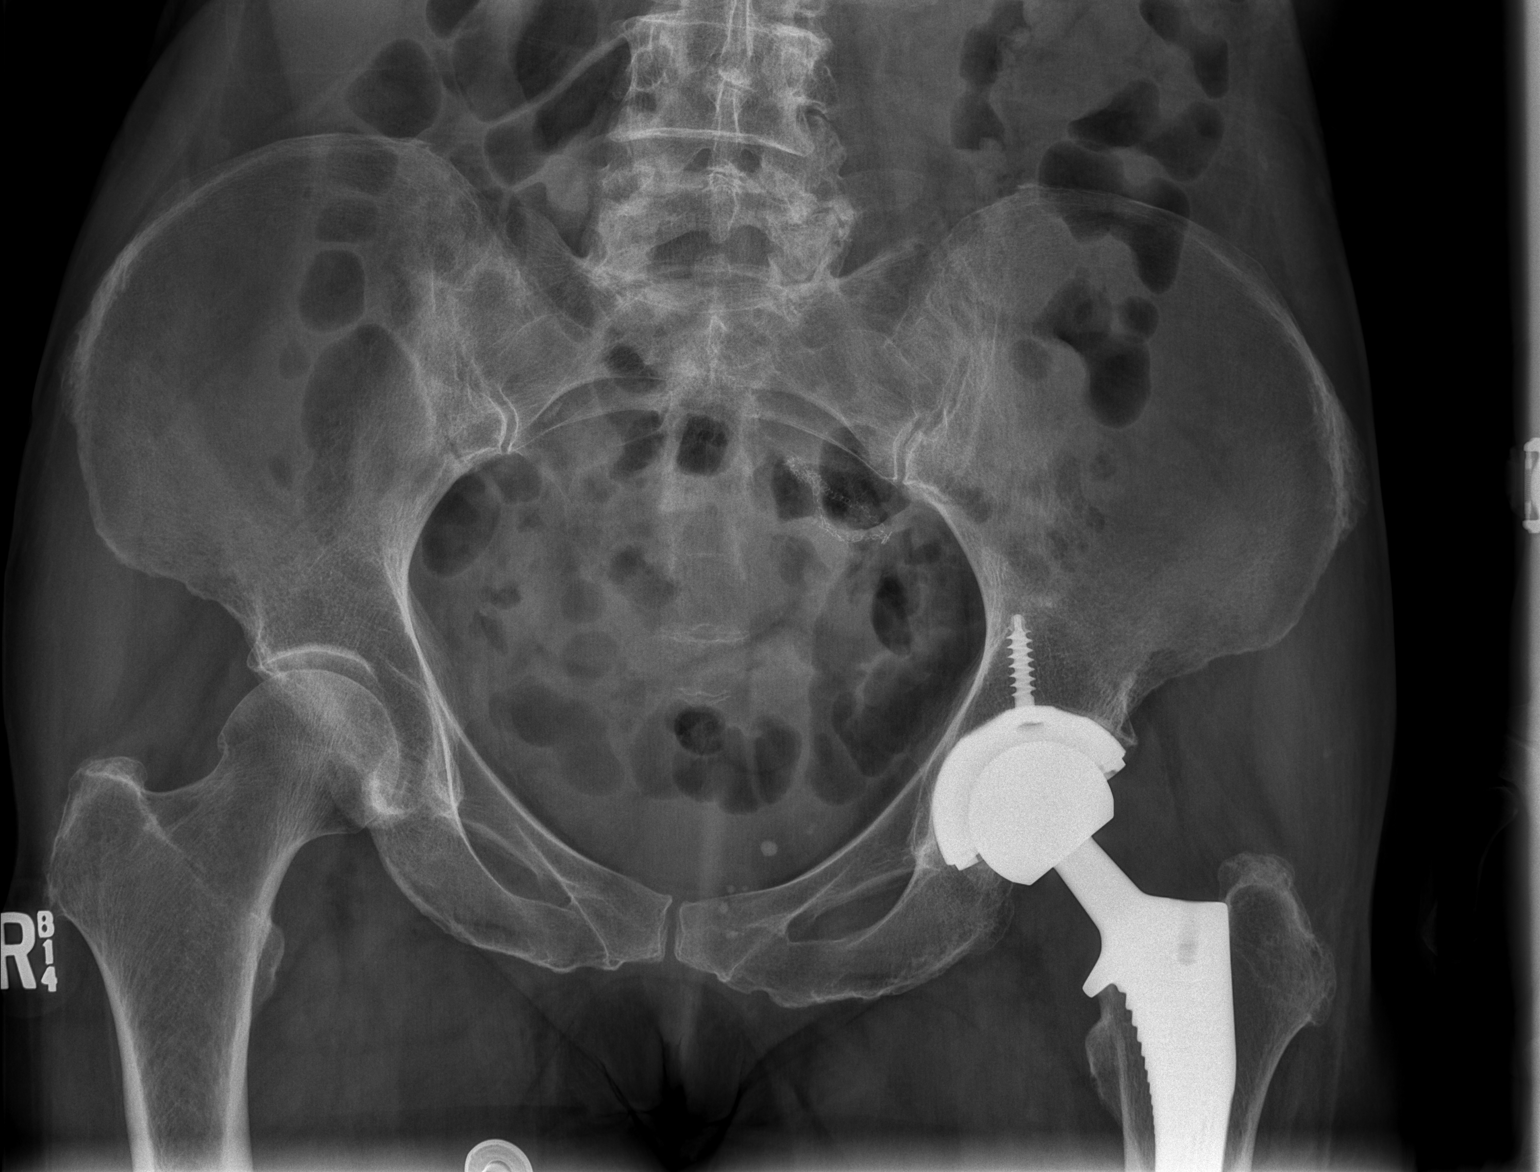

[t hip ap left]
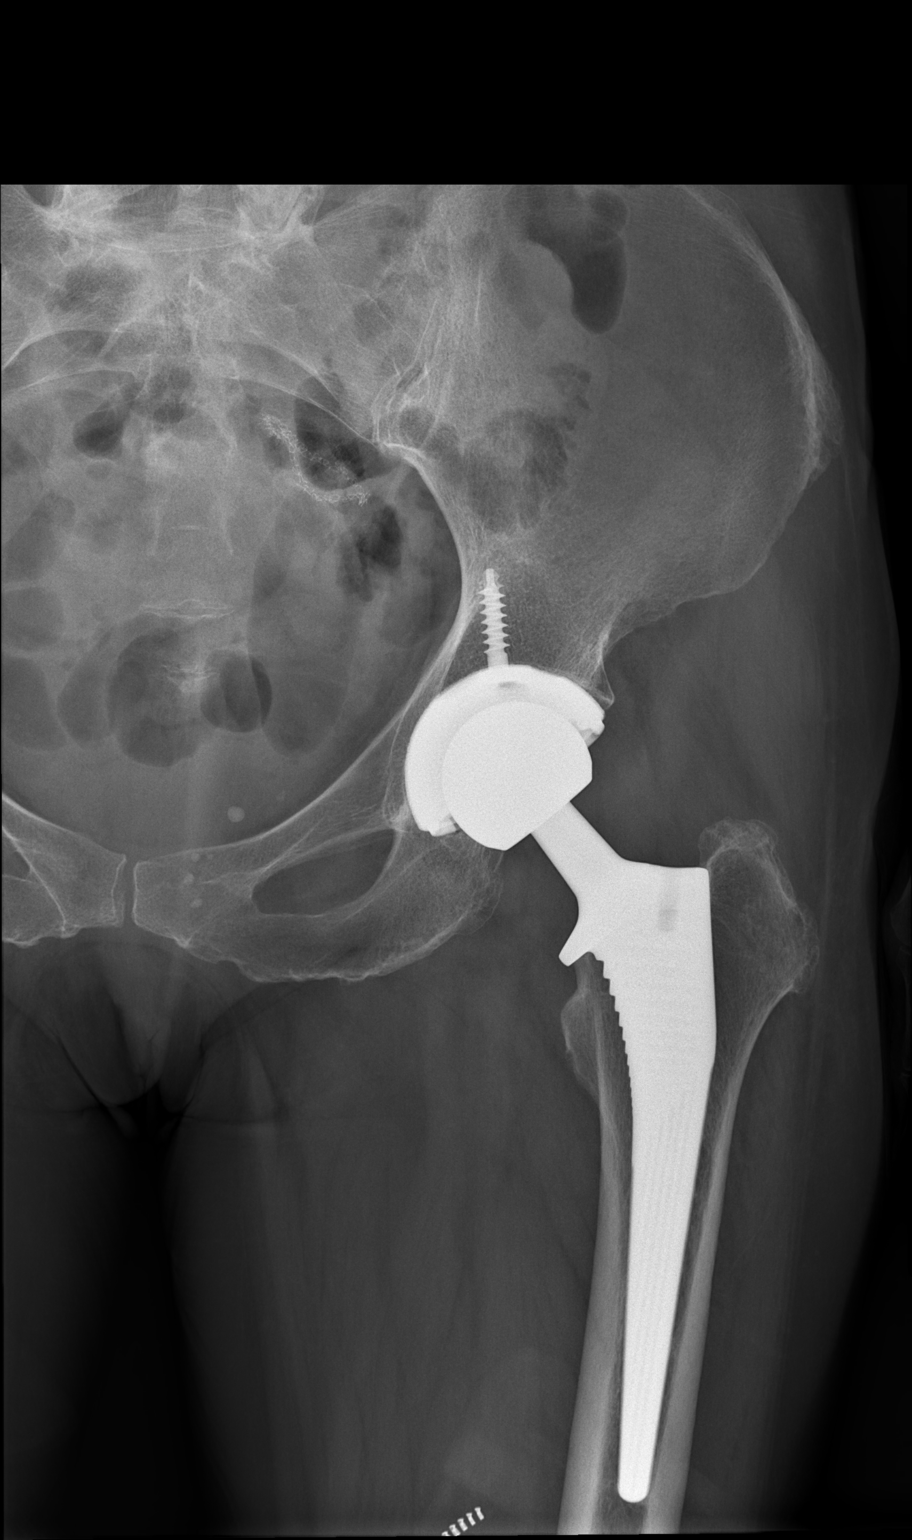

[t hip frog leg left]
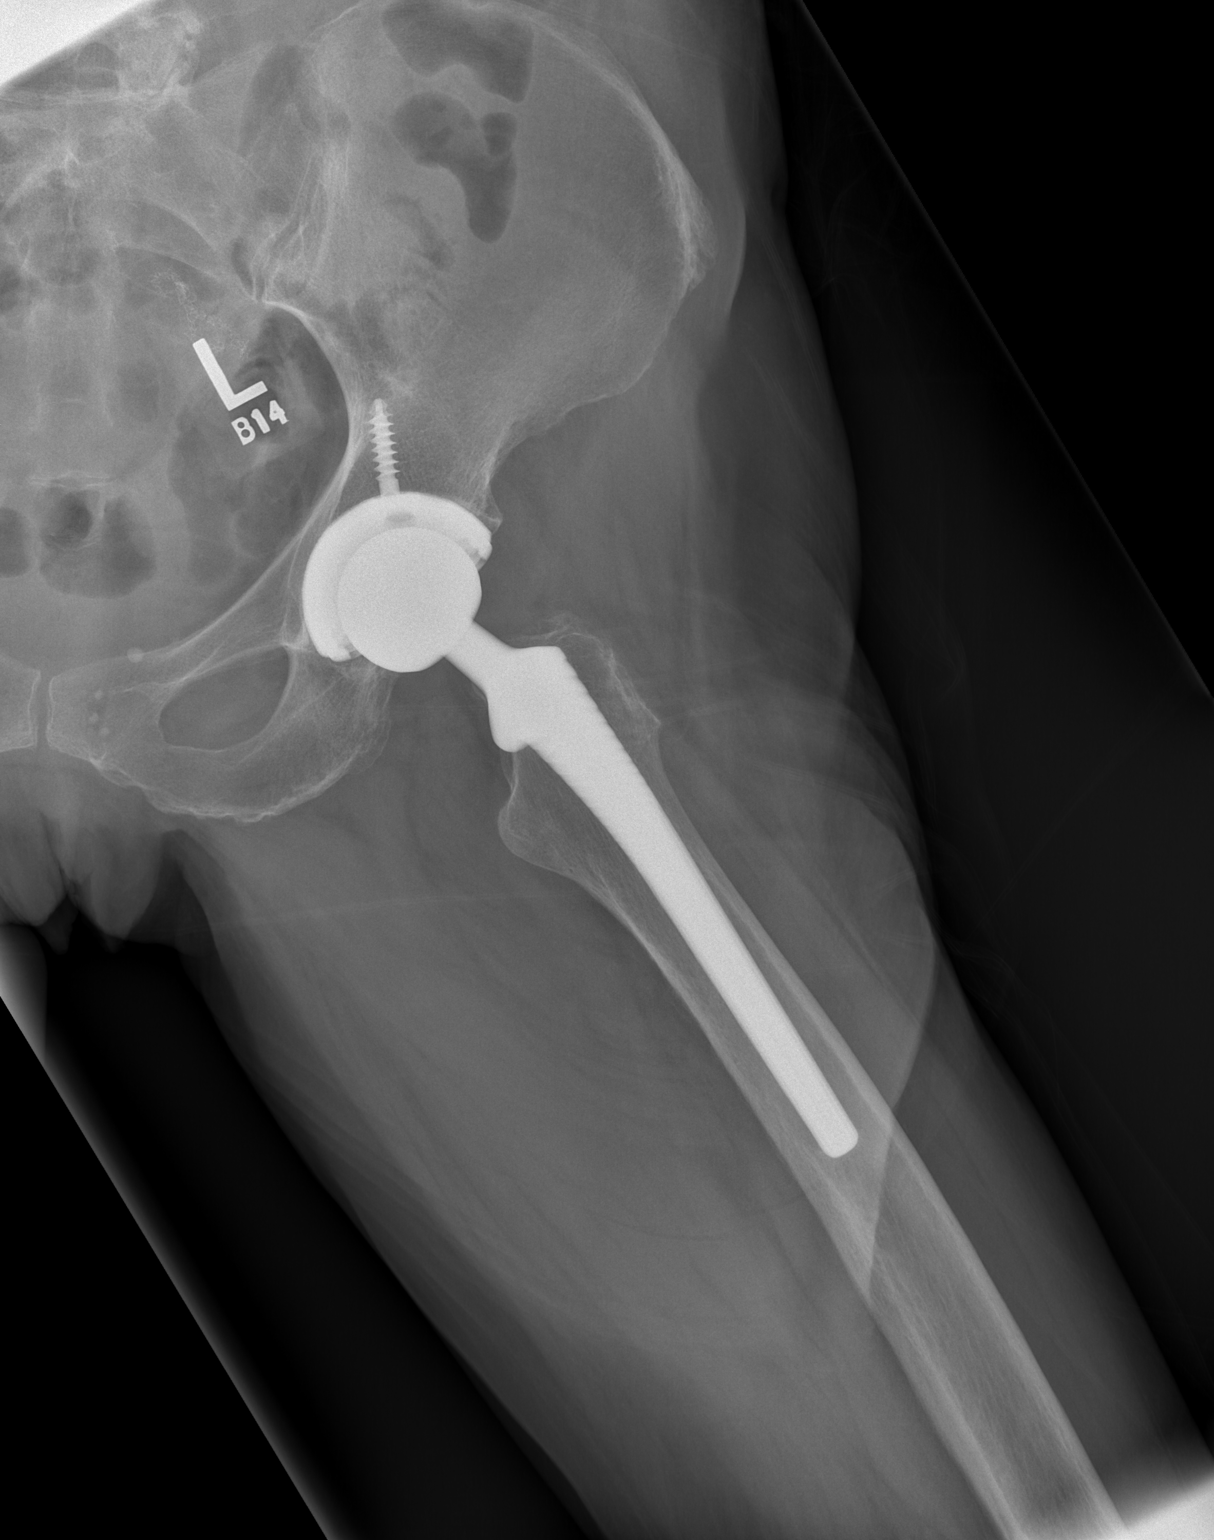

[3 of 3 positions shown; findings below may reference images not displayed]

FINDINGS: Frontal pelvis as well as frontal and lateral left hip images were
obtained. There is a total hip replacement on the left with
prosthetic components well-seated. No acute fracture or dislocation.
Right hip joint appears unremarkable. Sacroiliac joints appear
unremarkable.
IMPRESSION: Total hip replacement on the left with prosthetic components
well-seated. No acute fracture or dislocation. Joint spaces appear
unremarkable.

## 2019-12-27 ENCOUNTER — Other Ambulatory Visit: Payer: Self-pay | Admitting: Family Medicine

## 2019-12-27 DIAGNOSIS — N63 Unspecified lump in unspecified breast: Secondary | ICD-10-CM

## 2020-01-04 ENCOUNTER — Other Ambulatory Visit: Payer: Self-pay | Admitting: Family Medicine

## 2020-01-04 DIAGNOSIS — R748 Abnormal levels of other serum enzymes: Secondary | ICD-10-CM

## 2020-01-10 ENCOUNTER — Other Ambulatory Visit: Payer: Self-pay | Admitting: Family Medicine

## 2020-01-10 ENCOUNTER — Other Ambulatory Visit: Payer: Self-pay

## 2020-01-10 ENCOUNTER — Ambulatory Visit
Admission: RE | Admit: 2020-01-10 | Discharge: 2020-01-10 | Disposition: A | Discharge status: Home / Self Care | Payer: Medicare Other | Source: Ambulatory Visit | Attending: Family Medicine | Admitting: Family Medicine

## 2020-01-10 DIAGNOSIS — N63 Unspecified lump in unspecified breast: Secondary | ICD-10-CM

## 2020-01-18 ENCOUNTER — Other Ambulatory Visit: Payer: Medicare Other

## 2020-02-22 IMAGING — CR DG CHEST 2V
2 series · 2 of 2 positions shown · non-contrast
Comparison: June 29, 2017

CLINICAL DATA: Chest pain and shortness of breath

EXAM:
CHEST - 2 VIEW

[w chest pa (1 of 2)]
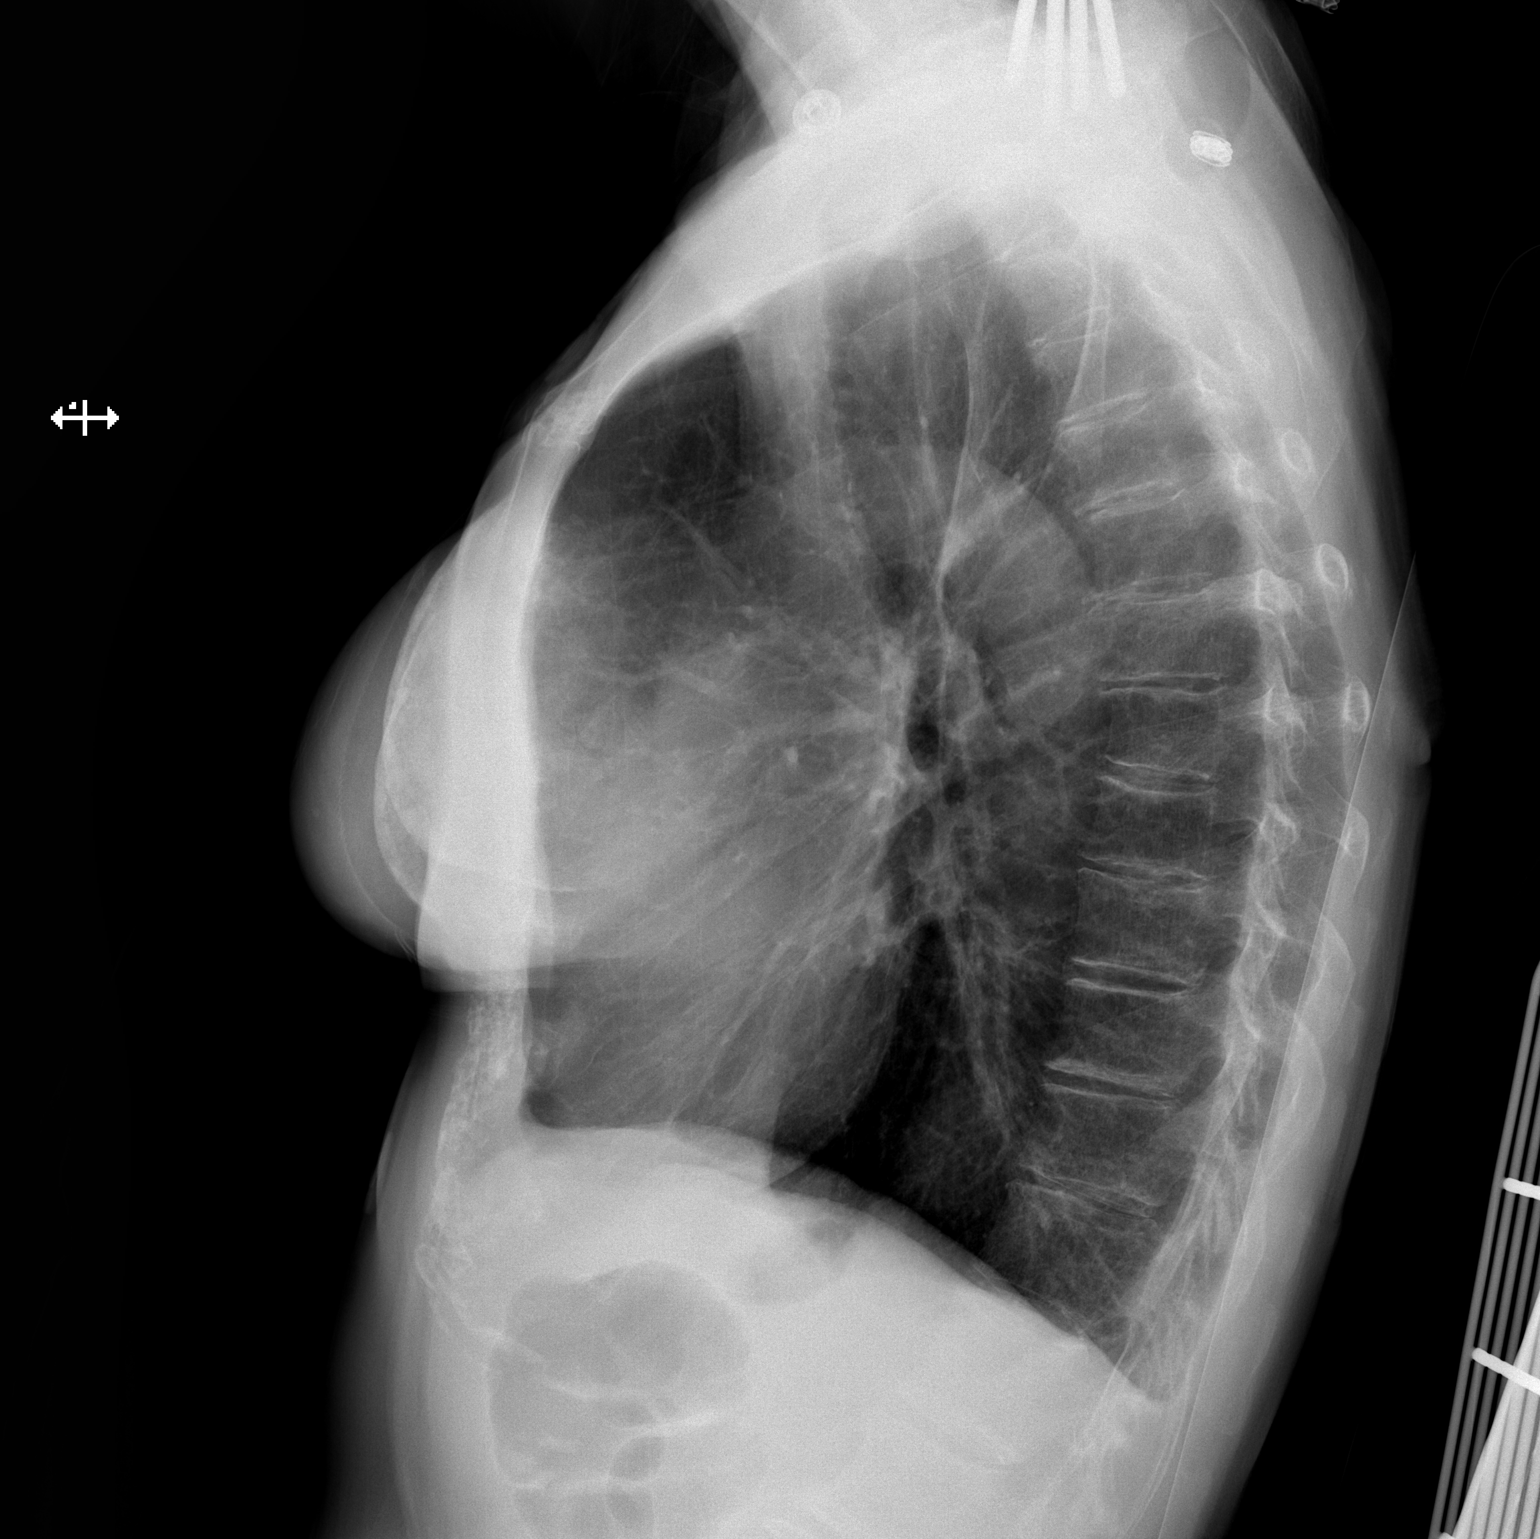

[w chest pa (2 of 2)]
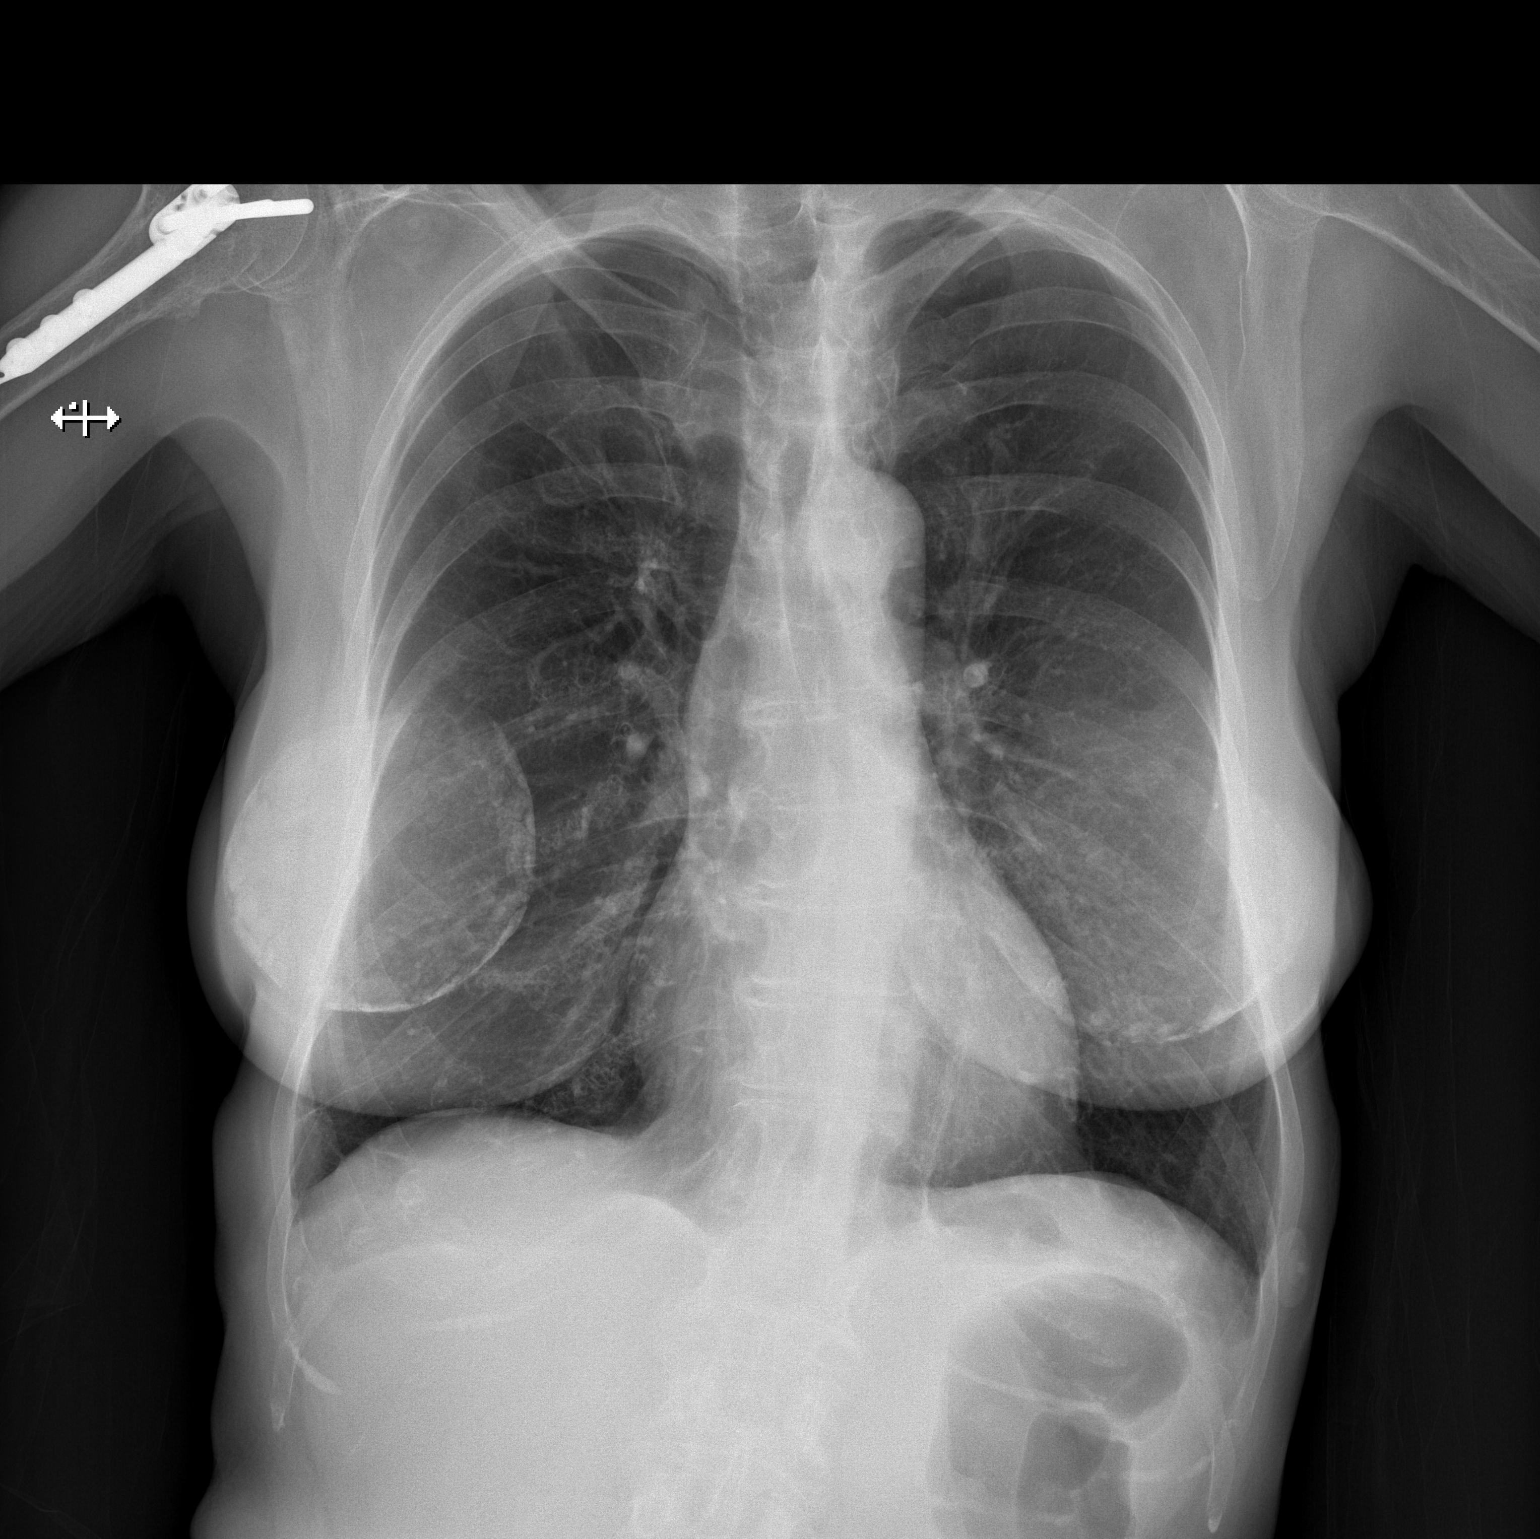

[2 of 2 positions shown; findings below may reference images not displayed]

FINDINGS: The heart size and mediastinal contours are within normal limits.
Both lungs are clear. The visualized skeletal structures are
unremarkable.
IMPRESSION: No active cardiopulmonary disease.

## 2020-03-13 IMAGING — CT CT MAXILLOFACIAL W/O CM
3 of 4 series · 17 of 47 positions shown, 20 images · non-contrast
Comparison: Head CT dated 03/31/2017

CLINICAL DATA: 66-year-old female with fall and facial trauma.

EXAM:
CT MAXILLOFACIAL WITHOUT CONTRAST
TECHNIQUE: Multidetector CT imaging of the maxillofacial structures was
performed. Multiplanar CT image reconstructions were also generated.

[Series 3: max soft · axial · 0.33mm/px · z∈[-36,+104]mm · 11 of 82 slices shown, 14 images]
[im 6/82  brain]
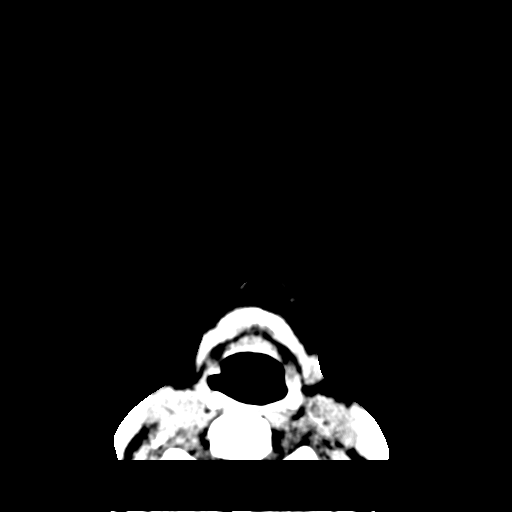
[im 6/82  bone]
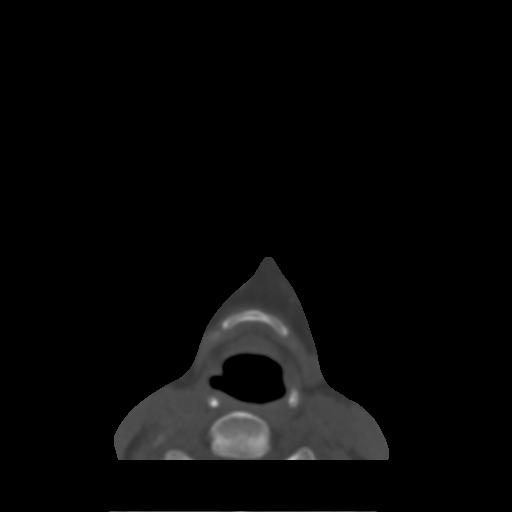
[im 12/82  bone]
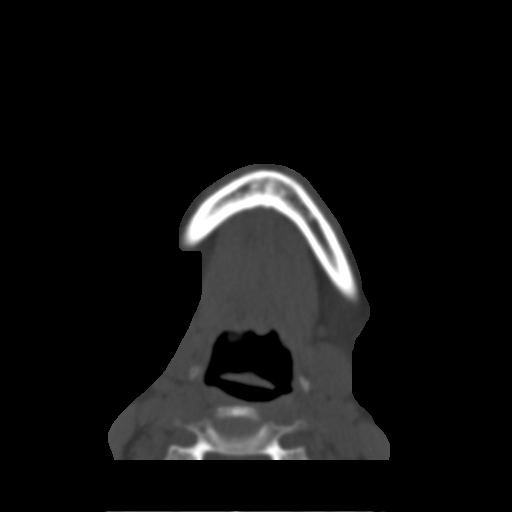
[im 20/82  bone]
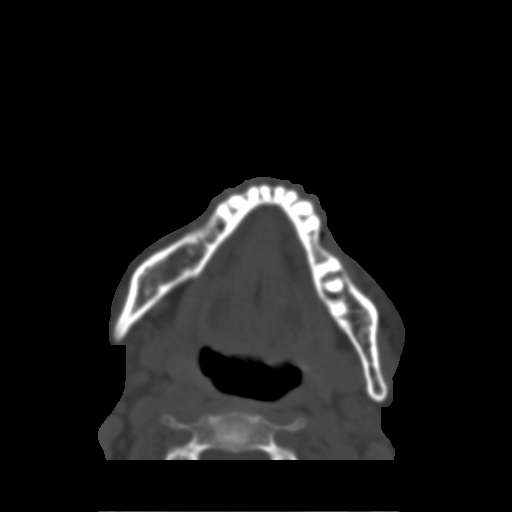
[im 26/82  bone]
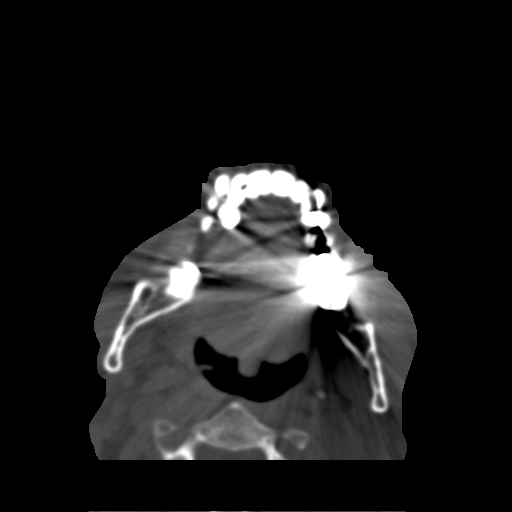
[im 34/82  brain]
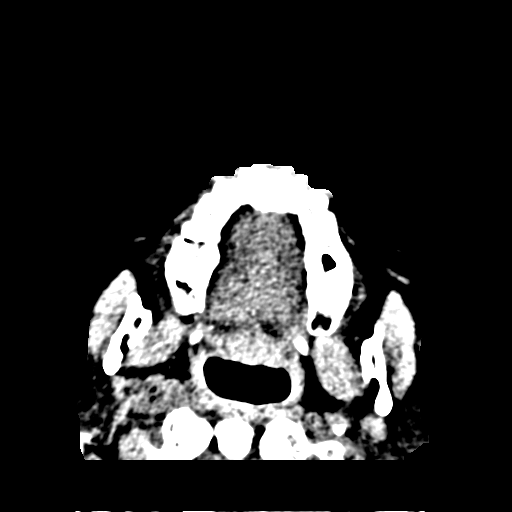
[im 34/82  bone]
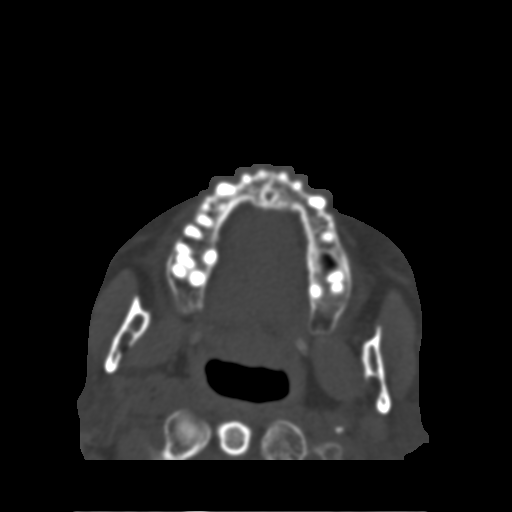
[im 42/82  bone]
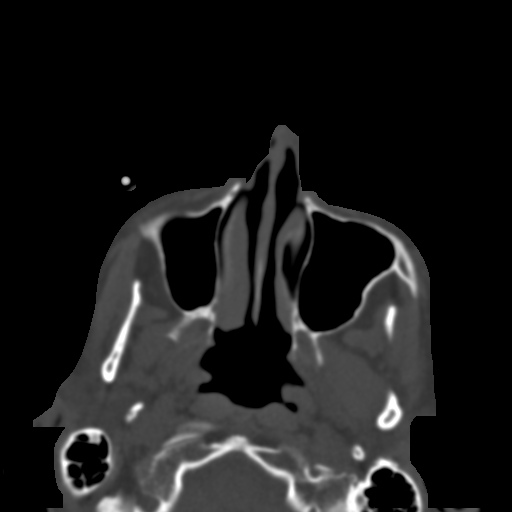
[im 48/82  bone]
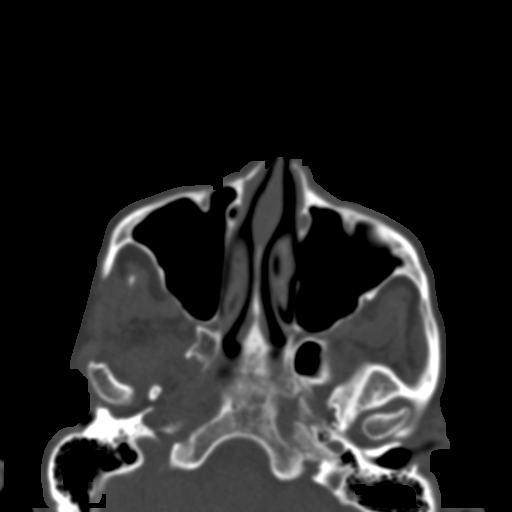
[im 56/82  bone]
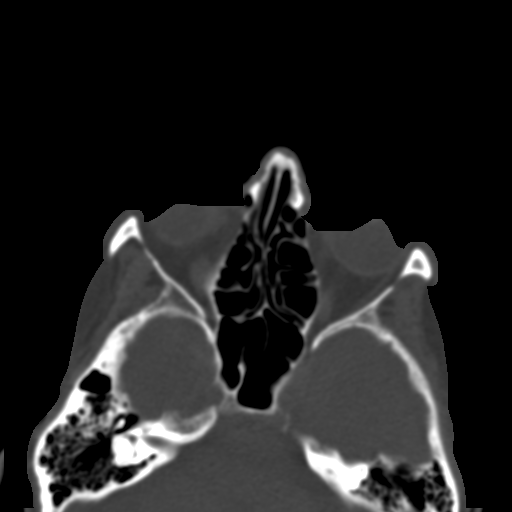
[im 62/82  brain]
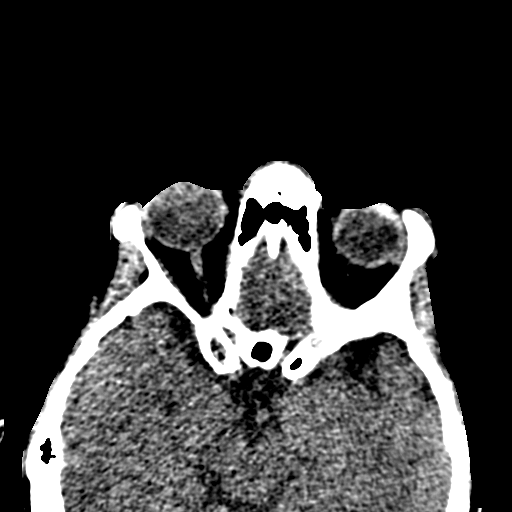
[im 62/82  bone]
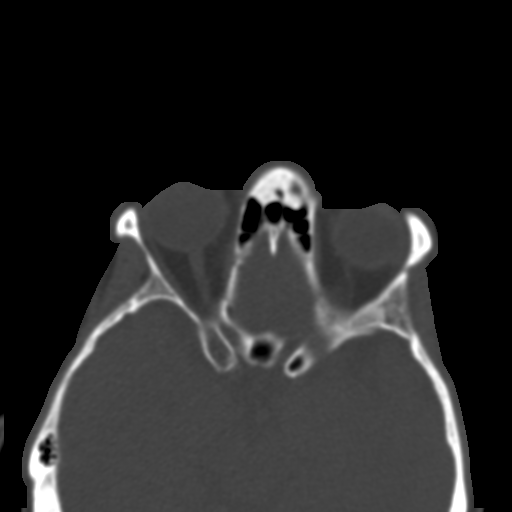
[im 70/82  bone]
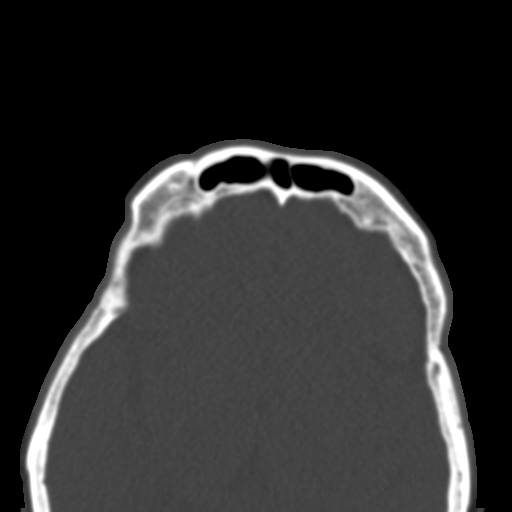
[im 76/82  bone]
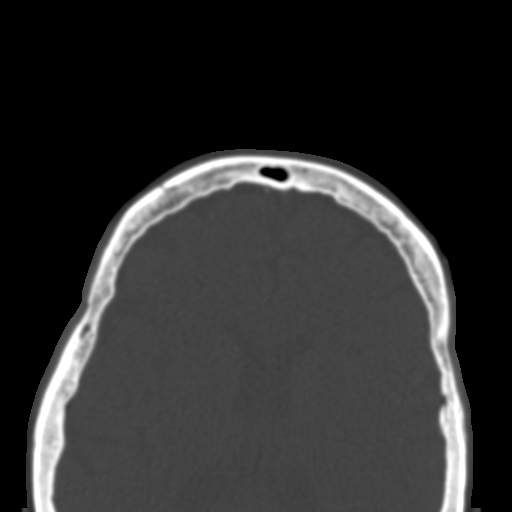

[Series 7: sagittal soft · sagittal · 0.33mm/px · 3 of 105 slices shown]
[im 35/105  bone]
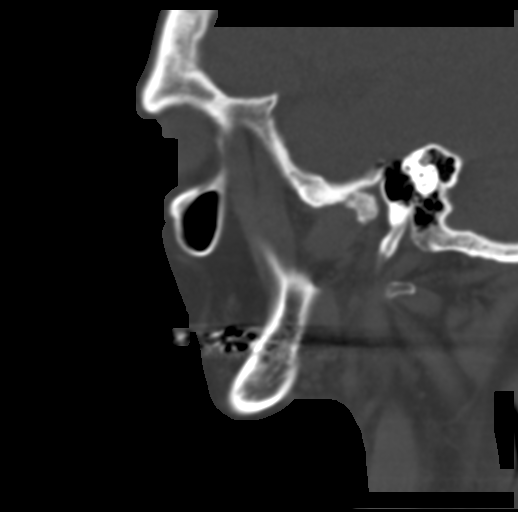
[im 53/105  bone]
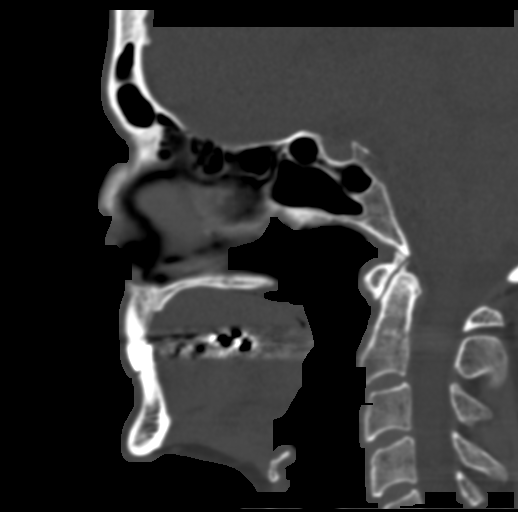
[im 70/105  bone]
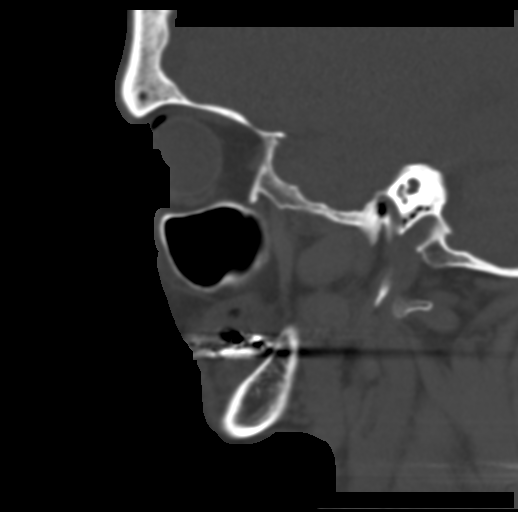

[Series 8: coronal bone · coronal · 0.35mm/px · 3 of 78 slices shown]
[im 20/78  bone]
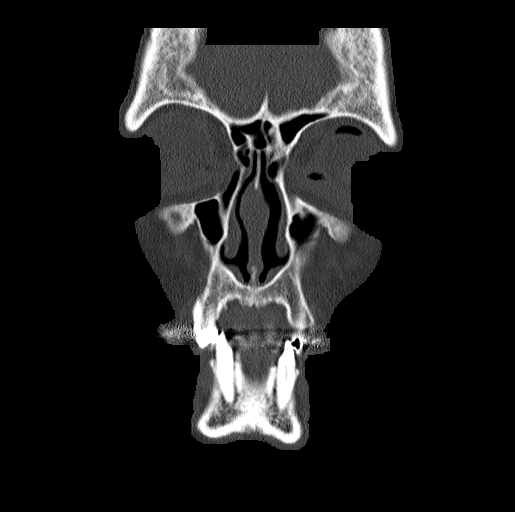
[im 39/78  bone]
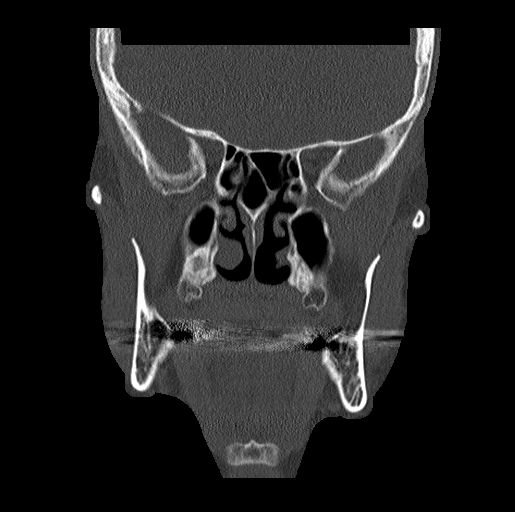
[im 58/78  bone]
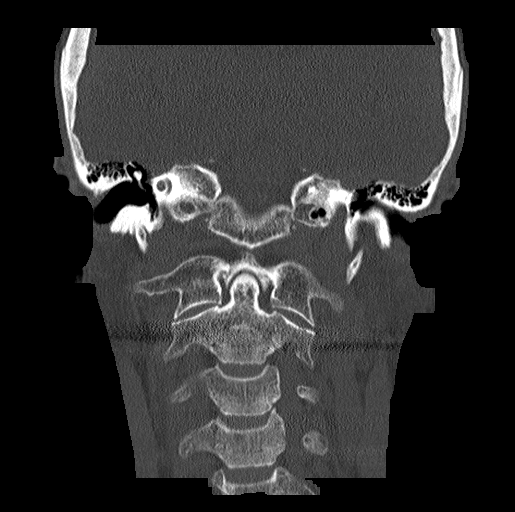

[17 of 47 positions shown; findings below may reference images not displayed]

FINDINGS: Osseous: There is no acute fracture. Minimal step-off of the right
nasal bone similar to prior CT likely related to an old injury. No
mandibular dislocation.

Orbits: Negative. No traumatic or inflammatory finding.

Sinuses: Clear.

Soft tissues: Negative.

Limited intracranial: No significant or unexpected finding.
IMPRESSION: No acute fracture.

## 2020-03-13 IMAGING — CR DG WRIST COMPLETE 3+V*L*
4 series · 4 of 4 positions shown · non-contrast
Comparison: None.

CLINICAL DATA: Initial evaluation for recent trauma, fall 1 month
ago.

EXAM:
LEFT WRIST - COMPLETE 3+ VIEW

[x wrist pa left]
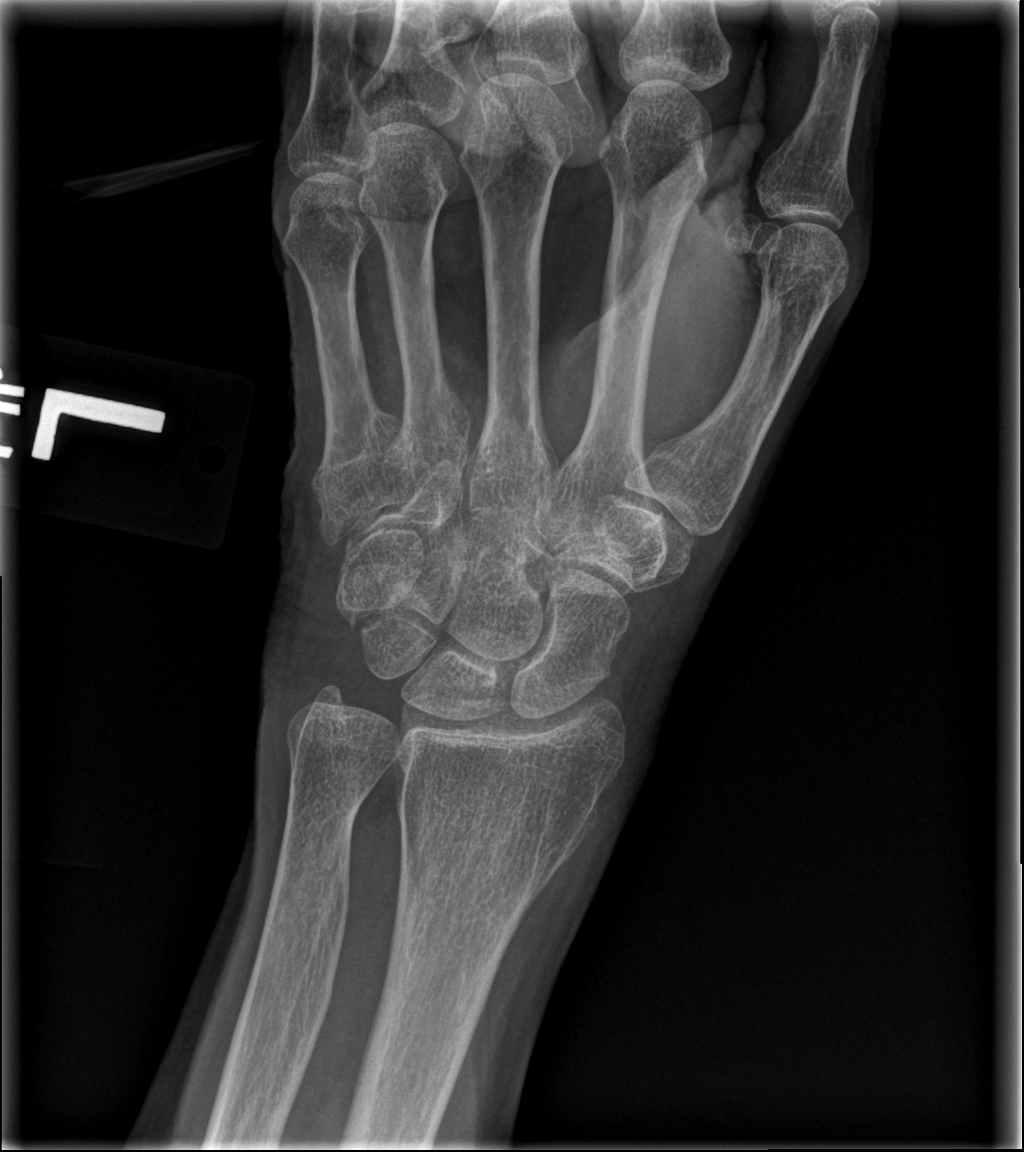

[x wrist obl left]
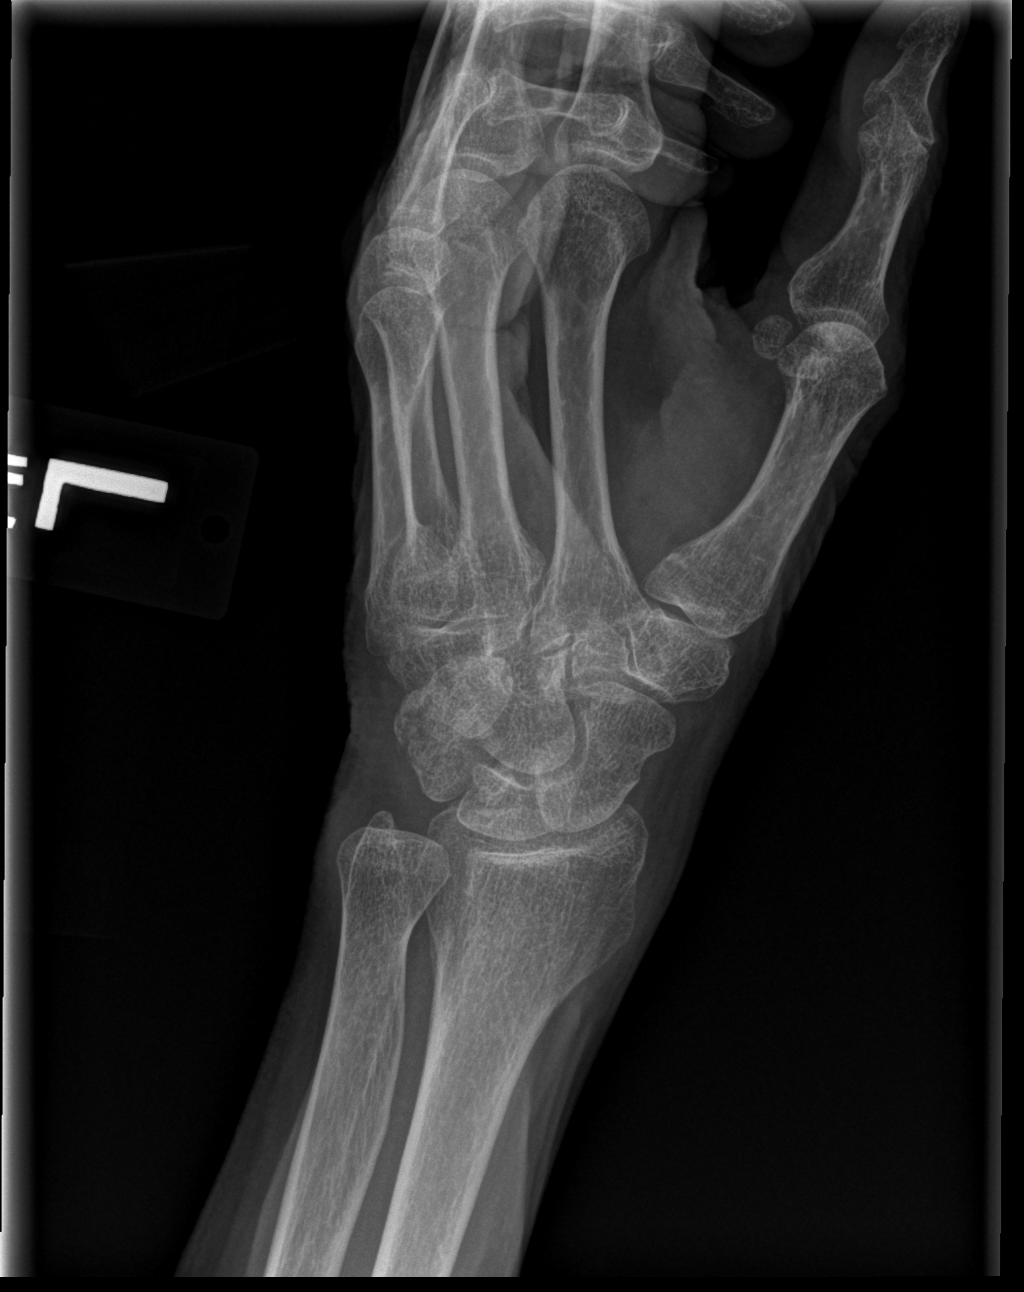

[x wrist lat left]
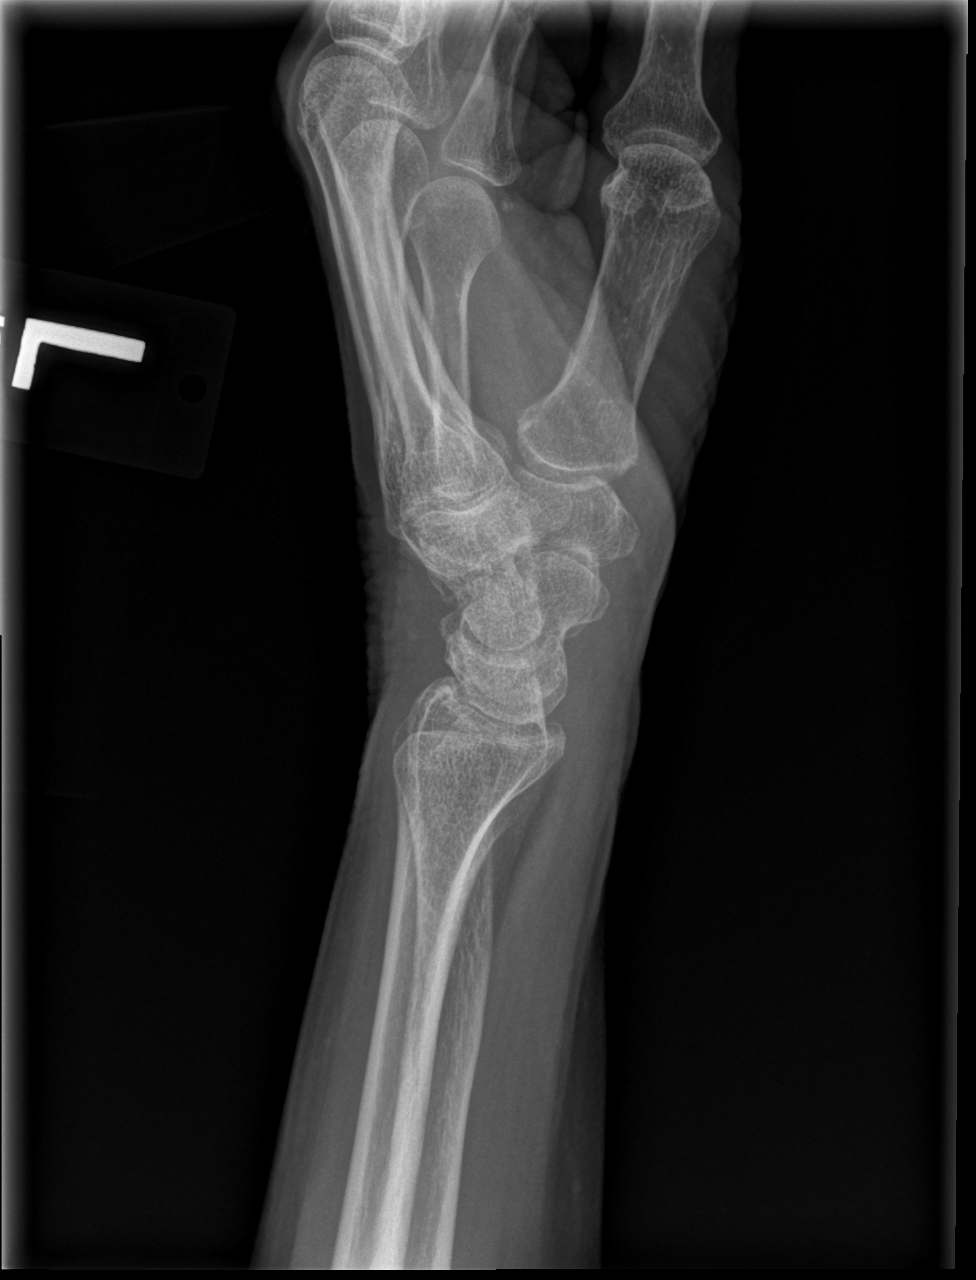

[x wrist navicular view left]
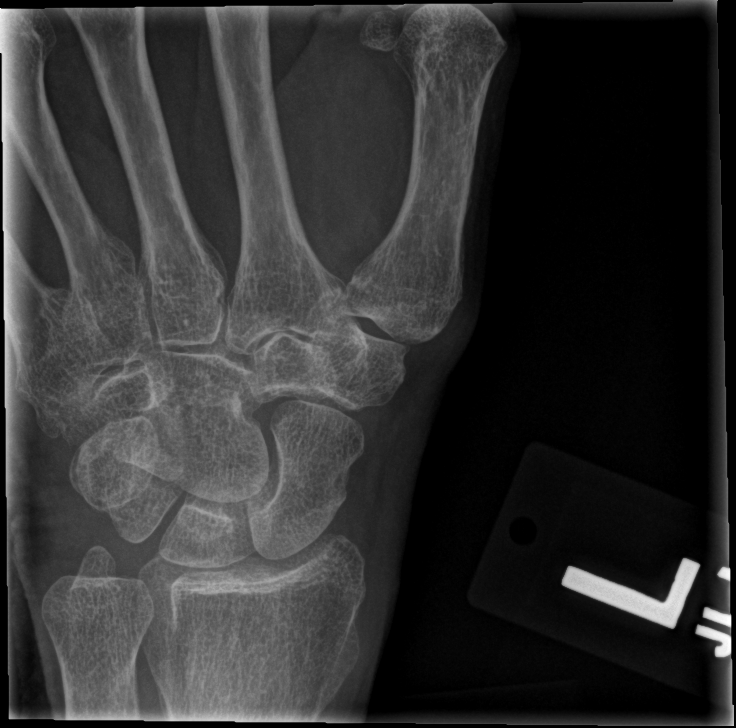

[4 of 4 positions shown; findings below may reference images not displayed]

FINDINGS: No acute fracture dislocation. Normal distal radioulnar and
radiocarpal articulations maintained. Osteopenia noted. No acute
soft tissue abnormality.
IMPRESSION: No acute osseous abnormality about the left wrist.

## 2020-03-16 ENCOUNTER — Other Ambulatory Visit: Payer: Self-pay | Admitting: Physical Medicine and Rehabilitation

## 2020-03-16 DIAGNOSIS — M542 Cervicalgia: Secondary | ICD-10-CM

## 2020-03-19 IMAGING — CR DG HIP (WITH OR WITHOUT PELVIS) 2-3V*L*
3 series · 3 of 3 positions shown · non-contrast
Comparison: 11/16/2017

CLINICAL DATA: Fall

EXAM:
DG HIP (WITH OR WITHOUT PELVIS) 2-3V LEFT

[pelvis ap]
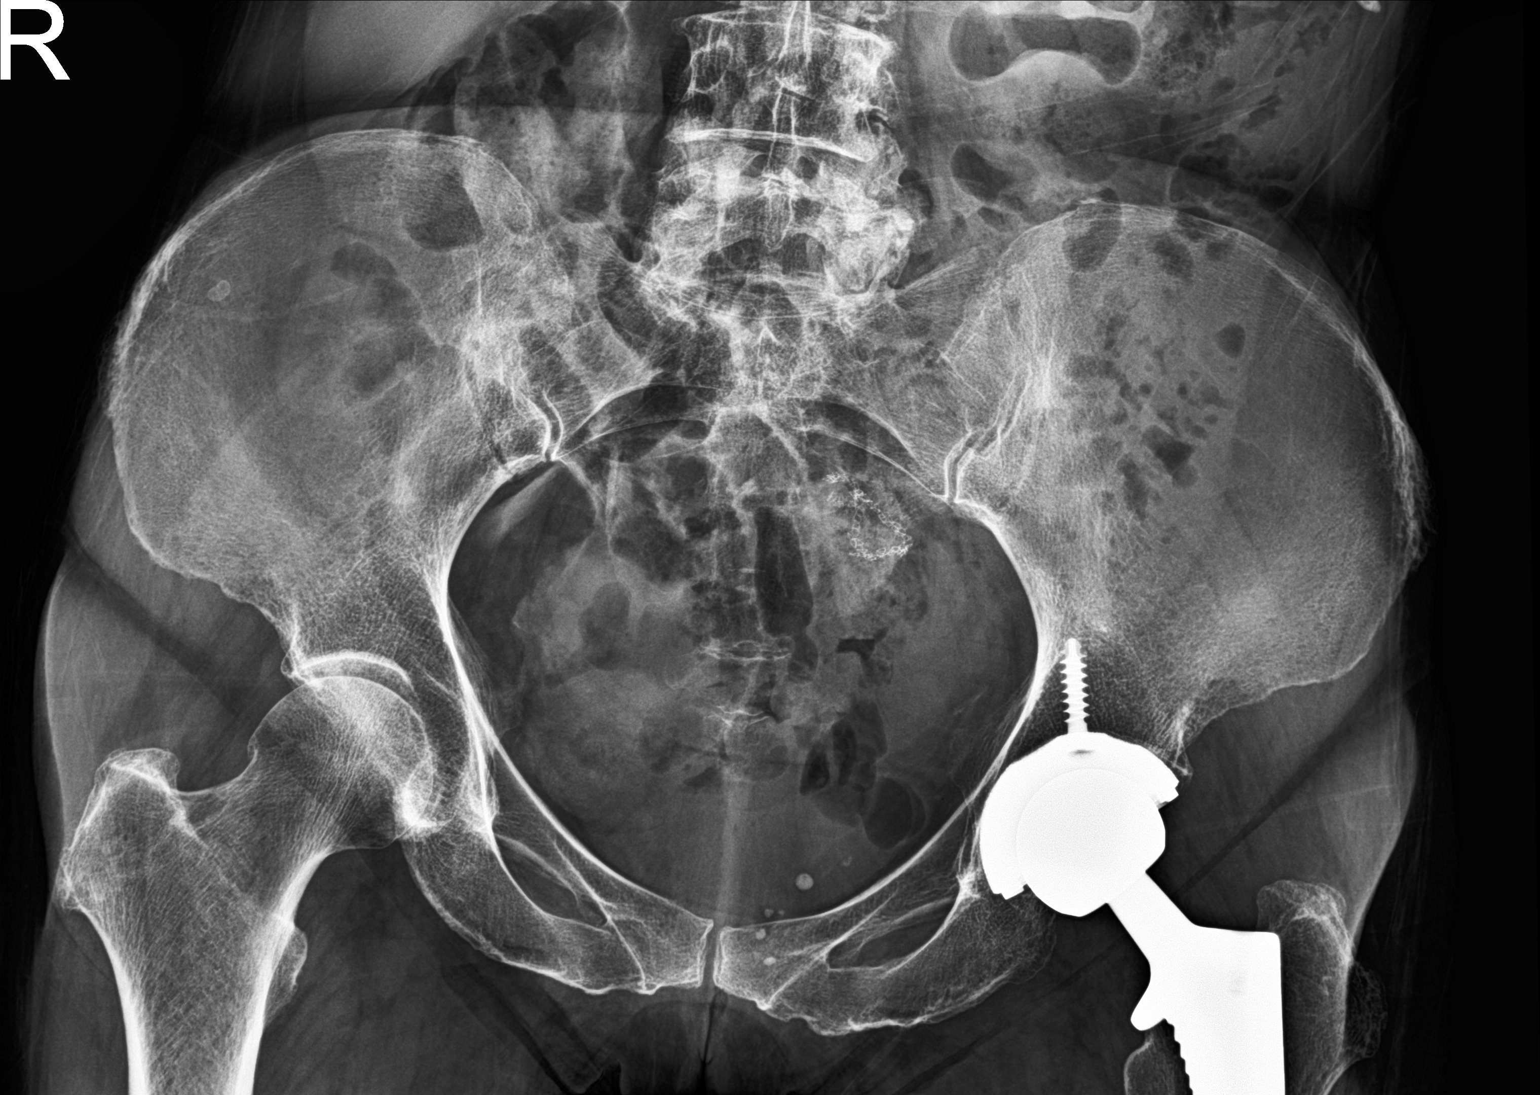

[hip ap]
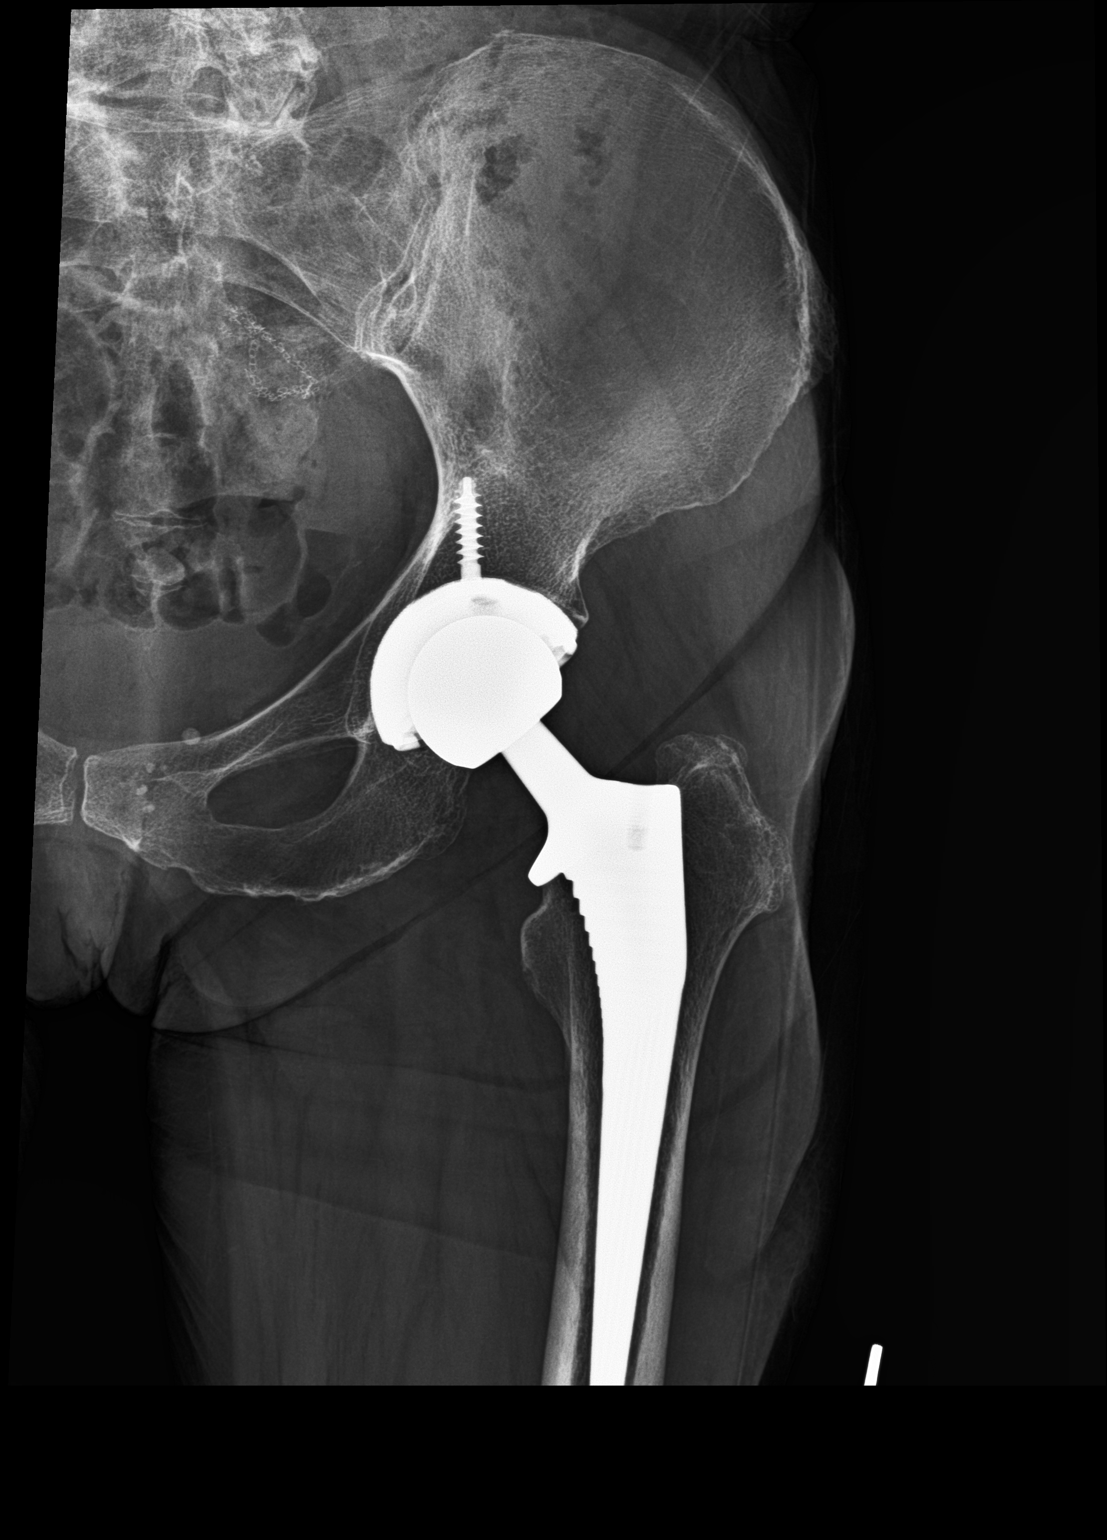

[hip frog leg]
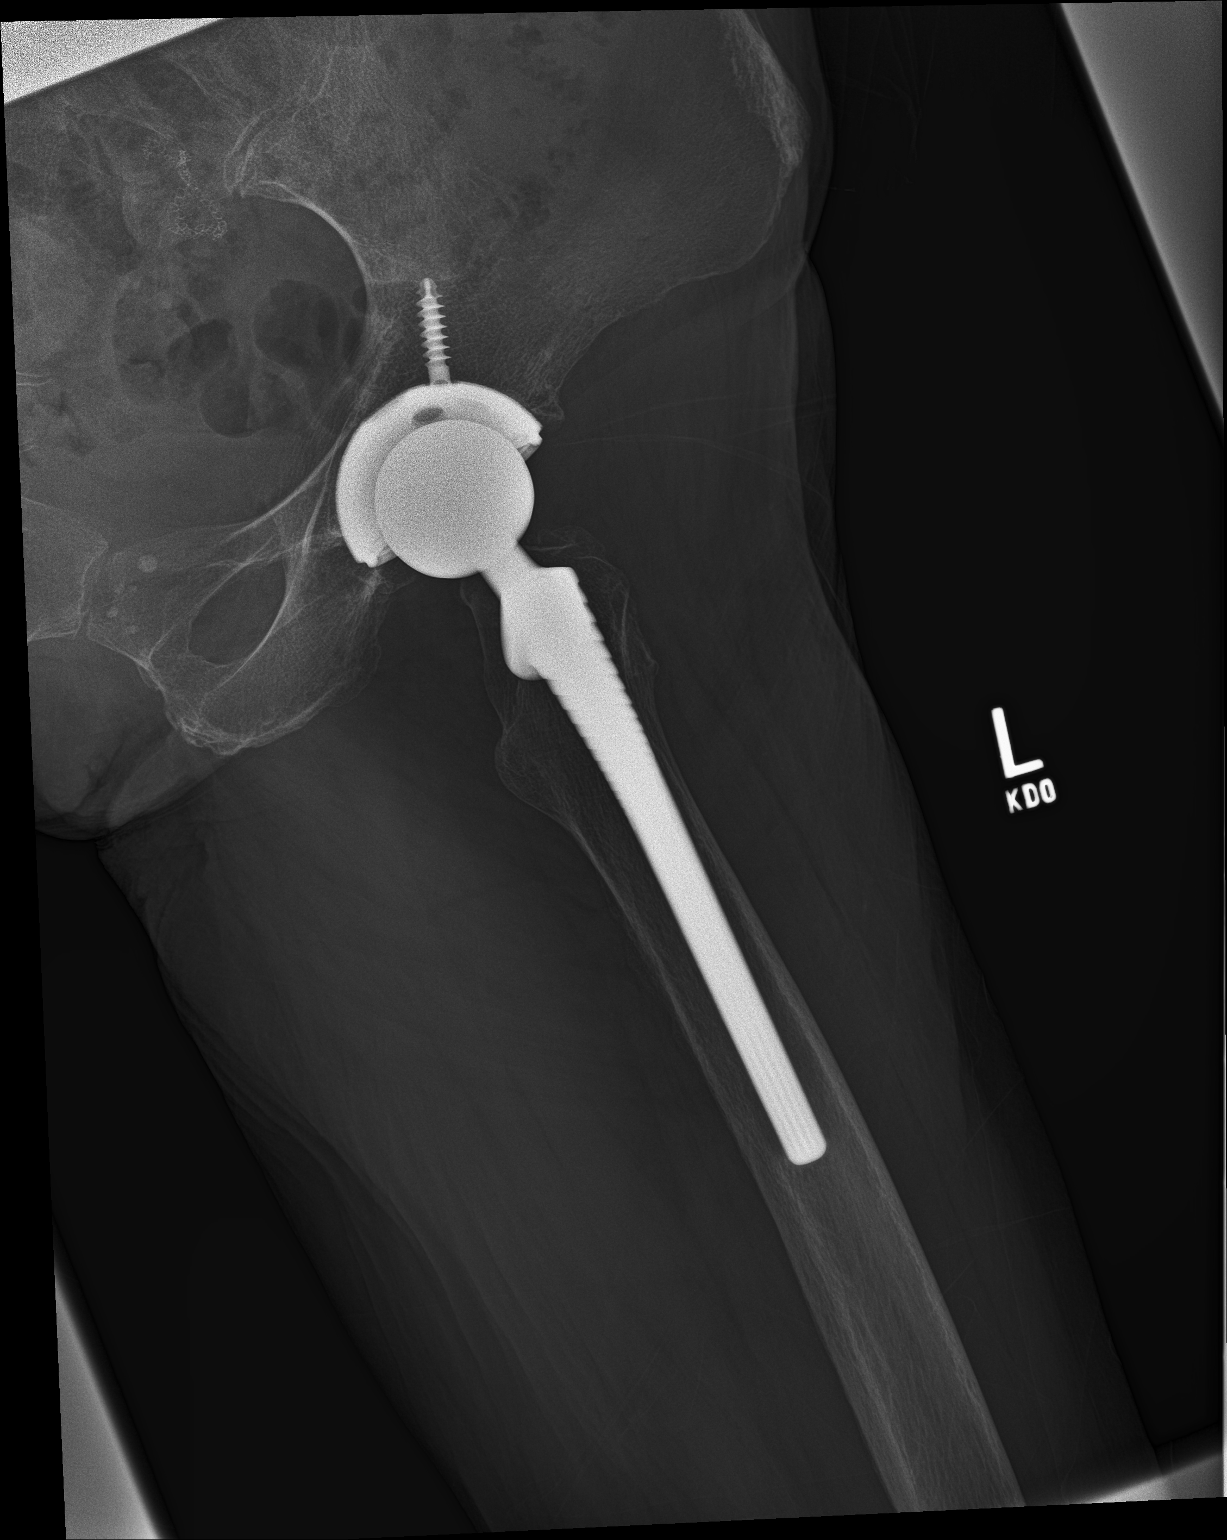

[3 of 3 positions shown; findings below may reference images not displayed]

FINDINGS: Postsurgical changes in the pelvis. Mild SI joint degenerative
change. Status post left hip replacement with intact hardware and no
fracture or dislocation.
IMPRESSION: Status post left hip replacement.  No acute osseous abnormality.

## 2020-03-26 ENCOUNTER — Ambulatory Visit
Admission: RE | Admit: 2020-03-26 | Discharge: 2020-03-26 | Disposition: A | Discharge status: Home / Self Care | Payer: Medicare Other | Source: Ambulatory Visit | Attending: Family Medicine | Admitting: Family Medicine

## 2020-03-26 ENCOUNTER — Other Ambulatory Visit: Payer: Self-pay

## 2020-03-26 ENCOUNTER — Other Ambulatory Visit: Payer: Self-pay | Admitting: Family Medicine

## 2020-03-26 DIAGNOSIS — R52 Pain, unspecified: Secondary | ICD-10-CM

## 2020-03-28 NOTE — Progress Notes (Deleted)
Assessment/Plan:    ***  Subjective:   Dorothy White was seen today in the movement disorders clinic for neurologic consultation at the request of Jonathon Jordan, MD.  The consultation is for the evaluation of possible PD.  Pt previously under the care of Dr. Jannifer Franklin and Dr. Krista Blue.  Those records are reviewed.  Pt is a 69 y/o female with h/o of hemiplegic migraine.  She did fairly well with Topamax.  In his December, 2018 the patient came to Dr. Jannifer Franklin after she fell and broke her hip.  She apparently had a syncopal episode associated with a fall.  She had a similar episode 2 years prior to that where had a syncopal episode, fell and fractured her right shoulder.  On examination, Dr. Jannifer Franklin noted that patient's gait was wide-based and decreased arm swing on the left.  He noted that he was going to watch out for the development of Parkinson's, but did not think she had it.  In 2019, she began to complain about multiple falls and difficulty ambulating.  She saw Dr. Jannifer Franklin because of that.  He noted that the patient had a "progressive gait disorder."  He stated that she did not have "true features of Parkinson's disease today."  It appears that the patient no showed for a few appointments following that and was discharged from their clinic.  Records from the ER indicates she has been in the emergency room several times with falls over the years.  She was in the emergency room in 2019 with her brother complaining about worsening cognitive trouble.   Specific Symptoms:  Tremor: {yes no:314532} Family hx of similar:  {yes no:314532} Voice: *** Sleep: ***  Vivid Dreams:  {yes no:314532}  Acting out dreams:  {yes no:314532} Wet Pillows: {yes no:314532} Postural symptoms:  Yes.    Falls?  Yes.  Multiple, resulting in history of fractures and history of subdural hematoma in 2019, for which she has seen neurosurgery. Bradykinesia symptoms: {parkinson brady:18041} Loss of smell:  {yes  no:314532} Loss of taste:  {yes no:314532} Urinary Incontinence:  {yes no:314532} Difficulty Swallowing:  {yes no:314532} Handwriting, micrographia: {yes no:314532} Trouble with ADL's:  {yes no:314532}  Trouble buttoning clothing: {yes no:314532} Depression:  {yes no:314532} Memory changes:  {yes no:314532} Hallucinations:  {yes no:314532}  visual distortions: {yes no:314532} N/V:  {yes no:314532} Lightheaded:  {yes no:314532}  Syncope: {yes no:314532} Diplopia:  {yes no:314532} Dyskinesia:  {yes no:314532} Prior exposure to reglan/antipsychotics: {yes no:314532}  Neuroimaging of the brain has *** previously been performed.  It *** available for my review today.  Last CT brain was March, 2020.  There was atrophy, but it was unremarkable otherwise.  PREVIOUS MEDICATIONS: {Parkinson's RX:18200} Topamax  ALLERGIES:   Allergies  Allergen Reactions  . Opana [Oxymorphone Hcl] Other (See Comments)    hallucinations  . Penicillins Hives    Has patient had a PCN reaction causing immediate rash, facial/tongue/throat swelling, SOB or lightheadedness with hypotension: Unknown Has patient had a PCN reaction causing severe rash involving mucus membranes or skin necrosis: No Has patient had a PCN reaction that required hospitalization No Has patient had a PCN reaction occurring within the last 10 years: No If all of the above answers are "NO", then may proceed with Cephalosporin use.     CURRENT MEDICATIONS:  Current Outpatient Medications  Medication Instructions  . feeding supplement, ENSURE ENLIVE, (ENSURE ENLIVE) LIQD 237 mLs, Oral, 3 times daily between meals  . FLUoxetine (PROZAC) 80 mg, Oral, Daily  .  LUTEIN PO 1 capsule, Oral, Daily  . Magnesium 500 mg, Oral, Daily at bedtime  . mometasone (NASONEX) 50 MCG/ACT nasal spray 2 sprays, Nasal, Daily  . Multiple Vitamin (MULTIVITAMIN WITH MINERALS) TABS tablet 1 tablet, Oral, Daily  . omeprazole (PRILOSEC) 40 mg, Oral, Daily  .  ondansetron (ZOFRAN) 8 mg, Oral, Every 8 hours PRN  . oxyCODONE-acetaminophen (PERCOCET/ROXICET) 5-325 MG tablet 1-2 tablets, Oral, Every 4 hours PRN  . SUMAtriptan (IMITREX) 50-100 mg, Oral, Every 2 hours PRN  . tiZANidine (ZANAFLEX) 4 mg, Oral, 3 times daily PRN  . topiramate (TOPAMAX) 100 mg, Oral, Daily at bedtime  . trazodone (DESYREL) 300 mg, Oral, At bedtime PRN  . vitamin B-12 (CYANOCOBALAMIN) 1,000 mcg, Oral, Daily  . vitamin E 100 Units, Oral, Daily    Objective:   VITALS:  There were no vitals filed for this visit.  GEN:  The patient appears stated age and is in NAD. HEENT:  Normocephalic, atraumatic.  The mucous membranes are moist. The superficial temporal arteries are without ropiness or tenderness. CV:  RRR Lungs:  CTAB Neck/HEME:  There are no carotid bruits bilaterally.  Neurological examination:  Orientation: The patient is alert and oriented x3.  Cranial nerves: There is good facial symmetry. Extraocular muscles are intact. The visual fields are full to confrontational testing. The speech is fluent and clear. Soft palate rises symmetrically and there is no tongue deviation. Hearing is intact to conversational tone. Sensation: Sensation is intact to light and pinprick throughout (facial, trunk, extremities). Vibration is intact at the bilateral big toe. There is no extinction with double simultaneous stimulation. There is no sensory dermatomal level identified. Motor: Strength is 5/5 in the bilateral upper and lower extremities.   Shoulder shrug is equal and symmetric.  There is no pronator drift. Deep tendon reflexes: Deep tendon reflexes are 2/4 at the bilateral biceps, triceps, brachioradialis, patella and achilles. Plantar responses are downgoing bilaterally.  Movement examination: Tone: There is ***tone in the bilateral upper extremities.  The tone in the lower extremities is ***.  Abnormal movements: *** Coordination:  There is *** decremation with RAM's,  *** Gait and Station: The patient has *** difficulty arising out of a deep-seated chair without the use of the hands. The patient's stride length is ***.  The patient has a *** pull test.     I have reviewed and interpreted the following labs independently   Chemistry      Component Value Date/Time   NA 140 07/03/2018 0338   NA 147 04/13/2017 0000   K 3.6 07/03/2018 0338   CL 112 (H) 07/03/2018 0338   CO2 19 (L) 07/03/2018 0338   BUN 18 07/03/2018 0338   BUN 11 04/13/2017 0000   CREATININE 0.96 07/03/2018 0338   CREATININE 0.95 02/15/2016 1536   GLU 93 04/13/2017 0000      Component Value Date/Time   CALCIUM 8.6 (L) 07/03/2018 0338   CALCIUM 9.4 01/13/2007 2356   ALKPHOS 84 07/02/2018 1903   AST 12 (L) 07/02/2018 1903   ALT 9 07/02/2018 1903   BILITOT 0.7 07/02/2018 1903      Lab Results  Component Value Date   TSH 0.720 12/17/2017   Lab Results  Component Value Date   WBC 5.9 07/02/2018   HGB 12.6 07/02/2018   HCT 41.6 07/02/2018   MCV 87.6 07/02/2018   PLT 280 07/02/2018   Patient had lab work on March 26, 2020.  White blood cells 5.8, hemoglobin 13.4, hematocrit 39.8 and  platelets 268.  Sedimentation rate 17.  Sodium was 140, potassium 4.5, chloride 106, CO2 28, BUN 14, creatinine 0.90, AST 17, ALT 12, TSH 0.66.  Last B12 was in May, 2021 and was greater than 1500.  Total time spent on today's visit was ***60 minutes, including both face-to-face time and nonface-to-face time.  Time included that spent on review of records (prior notes available to me/labs/imaging if pertinent), discussing treatment and goals, answering patient's questions and coordinating care.  Cc:  Maurice Small, MD

## 2020-03-29 ENCOUNTER — Emergency Department (HOSPITAL_COMMUNITY): Payer: Medicare Other

## 2020-03-29 ENCOUNTER — Ambulatory Visit: Payer: Medicare Other | Admitting: Neurology

## 2020-03-29 ENCOUNTER — Emergency Department (HOSPITAL_COMMUNITY)
Admission: EM | Admit: 2020-03-29 | Discharge: 2020-03-29 | Disposition: A | Discharge status: Home / Self Care | Payer: Medicare Other | Attending: Emergency Medicine | Admitting: Emergency Medicine

## 2020-03-29 ENCOUNTER — Encounter (HOSPITAL_COMMUNITY): Payer: Self-pay

## 2020-03-29 DIAGNOSIS — W19XXXA Unspecified fall, initial encounter: Secondary | ICD-10-CM | POA: Insufficient documentation

## 2020-03-29 DIAGNOSIS — M79602 Pain in left arm: Secondary | ICD-10-CM | POA: Insufficient documentation

## 2020-03-29 DIAGNOSIS — Z79899 Other long term (current) drug therapy: Secondary | ICD-10-CM | POA: Insufficient documentation

## 2020-03-29 DIAGNOSIS — I2510 Atherosclerotic heart disease of native coronary artery without angina pectoris: Secondary | ICD-10-CM | POA: Insufficient documentation

## 2020-03-29 DIAGNOSIS — S52022A Displaced fracture of olecranon process without intraarticular extension of left ulna, initial encounter for closed fracture: Secondary | ICD-10-CM

## 2020-03-29 DIAGNOSIS — S42292A Other displaced fracture of upper end of left humerus, initial encounter for closed fracture: Secondary | ICD-10-CM

## 2020-03-29 DIAGNOSIS — R0981 Nasal congestion: Secondary | ICD-10-CM | POA: Insufficient documentation

## 2020-03-29 DIAGNOSIS — M25512 Pain in left shoulder: Secondary | ICD-10-CM | POA: Diagnosis not present

## 2020-03-29 DIAGNOSIS — S62317A Displaced fracture of base of fifth metacarpal bone. left hand, initial encounter for closed fracture: Secondary | ICD-10-CM | POA: Insufficient documentation

## 2020-03-29 DIAGNOSIS — Z96642 Presence of left artificial hip joint: Secondary | ICD-10-CM | POA: Diagnosis not present

## 2020-03-29 DIAGNOSIS — F17210 Nicotine dependence, cigarettes, uncomplicated: Secondary | ICD-10-CM | POA: Diagnosis not present

## 2020-03-29 DIAGNOSIS — S42202A Unspecified fracture of upper end of left humerus, initial encounter for closed fracture: Secondary | ICD-10-CM | POA: Insufficient documentation

## 2020-03-29 NOTE — Discharge Instructions (Addendum)
Ms Dorothy White, sorry that you fell and injured your left arm. Your xrays show fractures of the shoulder, elbow and wrist joint. We placed you in a sling and cast today. Please keep this area dry.   Please follow up with orthopedics tomorrow at 8:45am. Please also follow up with your PCP next week and you should rearrange the neurology appointment which you missed today.   Please come back to the ED if you develop chest pain, shortness of breath, dizziness, worsening of left arm pain or swelling etc, vomiting, fevers, more falls etc.

## 2020-03-29 NOTE — ED Provider Notes (Signed)
[facility] DEPT Provider Note   CSN: [id number] Arrival date & time: 03/29/20  6767     History No chief complaint on file.   Dorothy White is a 69 y.o. female who presents today following a fall.   Pt woke up this morning and fell at 4:45am. She was going down the stairs and slipped down 2 steps. Before she slipped she said her "feet wouldn't move in the right direction". She fell head down and hurt left shoulder. Denies preceding symptoms such as chest pain, dizziness, dyspnea, fever, arm/jaw pain, diaphoresis. Denies LOC. Denies head injury, amnesia, vomiting, nausea. Has chronic migraines but denies new headache today ,visual changes, limb weakness and ataxia. Denies ETOH or illicit drug intake. Denies covid contacts. She now feels "spacey" and endorses left arm pain.   She had a fall 4 days ago where she felt backwards. She went to see her PCP who ordered xrays showing fractures at wrist joint. Waiting to [facility] [place] tomorrow. She has been ambulating with a cane since then. She has a history of recurrent falls and had a neurology appointment scheduled today for this.   Pt lives with a friend at home. Independent with ADLs. Ambulates with a cane.    The history is provided by the patient.        Past Medical History:  Diagnosis Date   Anemia    ?   Anxiety    Arthritis    "everywhere" (04/25/2016)   Basal cell carcinoma of left nasal tip    Chronic back pain    "all over" (04/25/2016)   Constipation    Depression    Diverticulitis    GERD (gastroesophageal reflux disease)    Headache    "weekly" (04/25/2016)   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   HLD (hyperlipidemia)    hx (04/25/2016)   Migraine    "3/wk sometimes; other times weekly; recently had Hemiplegic migraine" (04/25/2016)   Mild CAD    a. 25% mLAD, otherwise no sig disease 01/2016 cath.   NICM (nonischemic cardiomyopathy) ([place])    a. EF 40-45% by echo 11/2015 at time  of complicated migraine/neuro sx, 55-65% at time of cath 01/2016   Stroke [facility]) 06/2013   "mini" stroke , ?possibly hemaplegic migraine per pt    Patient Active Problem List   Diagnosis Date Noted   Protein-calorie malnutrition, severe 07/04/2018   UTI (urinary tract infection) 07/03/2018   Nasal bone fractures 07/03/2018   Knee pain 07/03/2018   Normal anion gap metabolic acidosis 20/94/7096   Subdural hematoma ([place]) 07/02/2018   Recurrent falls 04/16/2017   History of total hip replacement, 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   At risk for adverse drug event 04/07/2017   Acute blood loss as cause of postoperative anemia 04/04/2017   Acute delirium 04/04/2017   Osteoporosis 04/02/2017   Closed fracture of neck of left femur ([place]) 03/31/2017   Scalp contusion 03/31/2017   Fall at home, initial encounter 03/31/2017   Hip fracture ([place]) 03/31/2017   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   Hx of transient ischemic attack (TIA) 02/19/2017   Left carotid stenosis- 40-59% 02/19/2017   Protein calorie malnutrition ([place]) 02/18/2017   Falls 02/17/2017   Chest pain 09/26/2016   Diverticulitis of sigmoid colon 04/25/2016   Infection due to ESBL-producing Escherichia coli/Diverticular Abscess 03/15/2016   Diverticulitis 03/12/2016   Chronic pain syndrome 03/12/2016   Lesion of pancreas 03/12/2016   History of chest pain 03/12/2016   Hypokalemia 03/12/2016   Angina, 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[place]) - chest tightness and pressure with dyspnea with minimal exertion 02/17/2016    Class: Question of   TIA (transient ischemic attack) 12/26/2015   DOE (dyspnea on exertion) 12/26/2015   Right sided weakness 12/17/2015   Ataxia 12/17/2015   Shoulder fracture 02/15/2015   Chronic headache 10/11/2013   Weakness generalized 08/12/2013   Dizziness and giddiness 08/12/2013   Abnormality of gait 07/11/2013   Hyperlipidemia 05/07/2007   Migraine 05/07/2007   PREMATURE VENTRICULAR CONTRACTIONS,  FREQUENT 05/07/2007   GERD (gastroesophageal reflux disease) 05/07/2007   DIVERTICULOSIS, COLON 05/07/2007   DEGENERATION, CERVICAL DISC 05/07/2007   OSTEOPENIA 05/07/2007   Depression 03/24/2007   HEMORRHOIDS 03/24/2007   ALLERGIC RHINITIS 03/24/2007   LOW BACK PAIN 03/24/2007   MIGRAINES, HX OF 03/24/2007    Past Surgical History:  Procedure Laterality Date   ANTERIOR APPROACH HEMI HIP ARTHROPLASTY Left 04/01/2017   Procedure: LEFT DIRECT ANTERIOR TOTAL HIP REPLACEMENT;  Surgeon: Leandrew Koyanagi, MD;  Location: [facility];  Service: Orthopedics;  Laterality: Left;  LEFT DIRECT ANTERIOR TOTAL HIP REPLACEMENT   AUGMENTATION MAMMAPLASTY  1980   BASAL CELL CARCINOMA EXCISION     "tip of my nose"   BUNIONECTOMY Bilateral 10/2003   CARDIAC CATHETERIZATION N/A 02/20/2016   Procedure: Left Heart Cath and Coronary Angiography;  Surgeon: Jettie Booze, MD;  Location: [facility];  Service: Cardiovascular;  Laterality: N/A;   COLONOSCOPY     DILATION AND CURETTAGE OF UTERUS     FRACTURE SURGERY     IR GENERIC HISTORICAL  03/18/2016   IR SINUS/FIST TUBE CHK-NON GI 03/18/2016 Aletta Edouard, MD [facility]   LAPAROSCOPIC SIGMOID COLECTOMY N/A 04/25/2016   Procedure: LAPAROSCOPIC SIGMOID COLECTOMY;  Surgeon: Stark Klein, MD;  Location: [facility];  Service: General;  Laterality: N/A;   OOPHORECTOMY Bilateral ~ 1999   ORIF HUMERUS FRACTURE Right 02/15/2015   Procedure: OPEN REDUCTION INTERNAL FIXATION (ORIF) PROXIMAL HUMERUS FRACTURE;  Surgeon: Netta Cedars, MD;  Location: [facility];  Service: Orthopedics;  Laterality: Right;   RHINOPLASTY  1976   TONSILLECTOMY     TUBAL LIGATION  ~ 1983   VAGINAL HYSTERECTOMY  ~ 1998     OB History    Gravida  0   Para  0   Term  0   Preterm  0   AB  0   Living  0     SAB  0   TAB  0   Ectopic  0   Multiple  0   Live Births  0           Family History  Problem Relation Age of Onset   Lung cancer Mother 3     Migraines Mother    Heart attack Father        Vague   Hypertension Brother    Esophageal cancer Brother    Diabetes Paternal Grandmother        questionable   Colon cancer Neg Hx    Kidney disease Neg Hx    Liver disease Neg Hx     Social History   Tobacco Use   Smoking status: Current Some Day Smoker    Packs/day: 1.00    Years: 34.00    Pack years: 34.00    Types: Cigarettes    Last attempt to quit: 02/03/2017    Years since quitting: 3.1   Smokeless tobacco: Never Used   Tobacco comment: 10 per week or less  Vaping Use  Vaping Use: Never used  Substance Use Topics   Alcohol use: Yes    Alcohol/week: 1.0 standard drink    Types: 1 Standard drinks or equivalent per week    Comment: occ    Drug use: No    Types: Cocaine    Comment: Denies any drug use 02/19/18    Home Medications Prior to Admission medications   Medication Sig Start Date End Date Taking? Authorizing Provider  feeding supplement, ENSURE ENLIVE, (ENSURE ENLIVE) LIQD Take 237 mLs by mouth 3 (three) times daily between meals. 07/06/18   Hosie Poisson, MD  FLUoxetine (PROZAC) 40 MG capsule Take 80 mg by mouth daily. 04/18/18   [provider]  LUTEIN PO Take 1 capsule by mouth daily.     [provider]  Magnesium 500 MG TABS Take 500 mg by mouth at bedtime.    [provider]  mometasone (NASONEX) 50 MCG/ACT nasal spray Place 2 sprays into the nose daily. 04/22/17   Medina-Vargas, Monina C, NP  Multiple Vitamin (MULTIVITAMIN WITH MINERALS) TABS tablet Take 1 tablet by mouth daily. 07/07/18   Hosie Poisson, MD  omeprazole (PRILOSEC) 40 MG capsule Take 40 mg by mouth daily. 06/22/17   [provider]  ondansetron (ZOFRAN) 8 MG tablet Take 1 tablet (8 mg total) by mouth every 8 (eight) hours as needed for nausea or vomiting. 04/22/17   Medina-Vargas, Monina C, NP  oxyCODONE-acetaminophen (PERCOCET/ROXICET) 5-325 MG tablet Take 1-2 tablets by mouth every 4  (four) hours as needed for moderate pain or severe pain. 07/07/18   Mariel Aloe, MD  SUMAtriptan (IMITREX) 100 MG tablet Take 50-100 mg by mouth every 2 (two) hours as needed for migraine or headache.  12/29/17   [provider]  tiZANidine (ZANAFLEX) 4 MG tablet Take 4 mg by mouth 3 (three) times daily as needed for muscle spasms.  12/28/17   [provider]  topiramate (TOPAMAX) 100 MG tablet Take 1 tablet (100 mg total) by mouth at bedtime. 12/28/17   Kathrynn Ducking, MD  trazodone (DESYREL) 300 MG tablet Take 1 tablet (300 mg total) by mouth at bedtime as needed for sleep. Patient taking differently: Take 300 mg by mouth at bedtime. sleep 04/22/17   Medina-Vargas, Monina C, NP  vitamin B-12 (CYANOCOBALAMIN) 1000 MCG tablet Take 1 tablet (1,000 mcg total) by mouth daily. 02/19/17   Debbe Odea, MD  vitamin E 100 UNIT capsule Take 100 Units by mouth daily. 03/15/18   [provider]    Allergies    Opana [oxymorphone hcl] and Penicillins  Review of Systems   Review of Systems  Constitutional: Negative for chills, diaphoresis and fever.  HENT: Positive for congestion.   Eyes: Negative for visual disturbance.  Respiratory: Negative for cough and shortness of breath.   Cardiovascular: Negative for chest pain and palpitations.  Gastrointestinal: Negative for abdominal pain, blood in stool, constipation, diarrhea, nausea and vomiting.  Genitourinary: Negative for dysuria, hematuria and urgency.  Skin: Negative for color change.  Neurological: Negative for dizziness, tremors, speech difficulty, weakness, light-headedness and numbness.    Physical Exam Updated Vital Signs BP 108/78    Pulse 69    Temp 97.9 F (36.6 C) (Oral)    Resp 17    SpO2 97%   Physical Exam Constitutional:      General: She is awake. She is not in acute distress.    Appearance: She is underweight.  HENT:     Head: Normocephalic and atraumatic.  Nose: Congestion present.      Mouth/Throat:     Mouth: Mucous membranes are moist.  Eyes:     Extraocular Movements: Extraocular movements intact.     Conjunctiva/sclera: Conjunctivae normal.     Pupils: Pupils are equal, round, and reactive to light.  Cardiovascular:     Rate and Rhythm: Normal rate and regular rhythm.     Pulses: Normal pulses.     Heart sounds: Normal heart sounds.  Pulmonary:     Effort: No respiratory distress.     Breath sounds: No wheezing or rales.  Abdominal:     General: Abdomen is flat. Bowel sounds are normal. There is no distension.     Palpations: Abdomen is soft.     Tenderness: There is no abdominal tenderness. There is no guarding.  Musculoskeletal:     Right shoulder: Normal. No swelling or deformity. Normal range of motion. Normal strength.     Left shoulder: Swelling, deformity and tenderness present. Decreased range of motion.     Left upper arm: Swelling, edema and tenderness present.     Left elbow: Swelling and deformity present. Decreased range of motion. Tenderness present.     Left forearm: Swelling, deformity and tenderness present.     Left wrist: Swelling and tenderness present. Decreased range of motion.     Right hand: Normal.     Left hand: Normal.       Arms:  Skin:    General: Skin is warm and dry.  Neurological:     General: No focal deficit present.     Mental Status: She is alert and oriented to person, place, and time.     ED Results / Procedures / Treatments   Labs (all labs ordered are listed, but only abnormal results are displayed) Labs Reviewed - No data to display  EKG None  Radiology DG Elbow Complete Left  Result Date: 03/29/2020 CLINICAL DATA:  Elbow fracture, fall down stairs at home. EXAM: LEFT ELBOW - COMPLETE 3+ VIEW COMPARISON:  Forearm evaluation of 03/29/2020 FINDINGS: Displaced olecranon fracture with mild comminution, multipart fragmentation of the proximal fracture fragment, approximately 6 mm retraction of the proximal  fracture fragments with soft tissue swelling over the dorsum of the elbow, associated with posterior fat pad. Fracture is intra-articular. No visible coronoid process fracture. Radiocapitellar relationships are maintained. No sign of radial head fracture. No additional fracture IMPRESSION: Displaced olecranon fracture with soft tissue swelling over the dorsum of the comminuted proximal fracture fragment that is retracted/displaced approximately 6 mm. Orthopedic consultation is suggested. Electronically Signed   By: Zetta Bills M.D.   On: 03/29/2020 08:23   DG Forearm Left  Result Date: 03/29/2020 CLINICAL DATA:  Patient fell down stairs.  Left arm pain. EXAM: LEFT FOREARM - 2 VIEW COMPARISON:  None. FINDINGS: Two-view exam shows diffuse demineralization. Proximal ulnar fracture noted at the level of the elbow, apparently isolating the olecranon as a free fragment. No evidence for an associated radius fracture. IMPRESSION: Proximal ulnar fracture apparently isolating the olecranon as a free fragment. Dedicated elbow films recommended. Electronically Signed   By: Misty Stanley M.D.   On: 03/29/2020 07:10   DG Shoulder Left  Result Date: 03/29/2020 CLINICAL DATA:  Fall.  Left shoulder pain. EXAM: LEFT SHOULDER - 2+ VIEW COMPARISON:  None. FINDINGS: Two view exam of the left shoulder shows a transverse fracture of the proximal humerus, in the region of the surgical neck. Lateral film documents apex anterior angulation. Bones are  diffusely demineralized. IMPRESSION: Transverse fracture of the proximal humerus in the region of the surgical neck. Electronically Signed   By: Misty Stanley M.D.   On: 03/29/2020 07:08   DG Humerus Left  Result Date: 03/29/2020 CLINICAL DATA:  Fall EXAM: LEFT HUMERUS - 2+ VIEW COMPARISON:  None. FINDINGS: Anteromedially displaced proximal humeral shaft fracture. The glenohumeral joint and elbow remain approximated. Diffuse soft tissue swelling. Comminuted olecranon fracture is  better demonstrated on concurrent forearm radiographs. IMPRESSION: Anteromedially displaced proximal humeral shaft fracture. Please see concurrent forearm radiographs for better characterisation of comminuted olecranon fracture. Electronically Signed   By: Primitivo Gauze M.D.   On: 03/29/2020 07:09    Procedures Procedures (including critical care time)  Medications Ordered in ED Medications - No data to display  ED Course  I have reviewed the triage vital signs and the nursing notes.  Pertinent labs & imaging results that were available during my care of the patient were reviewed by me and considered in my medical decision making (see chart for details).    MDM Rules/Calculators/A&P                          Keyonni Percival is a 69 yr old female who presents today for a fall. Xrays of left show: displaced olecranon fracture, proximal ulnar fracture, transverse fracture of the proximal humerus anteromedially displaced. X-rays from 03/26/20 showed mildly comminuted intra-articular fracture distal margin fifth metacarpal. She did not have a splint over left wrist on presentation to the ED today. Precepted patient with ED Attending Dr Laverta Baltimore. Discussed with Dr Ninfa Linden from Orthopedics who recommended to continue follow up on 03/30/20 with Dr Erlinda [facility] which was initially for left wrist fracture. Placed left upper extremity in a sling and left posterior slab.   Follow up with orthopedics recommended as above. PDMP reviewed. Will not prescribe Percocet for this patient as she has had 30 tablets of refilled on 03/24/20 and 03/18/20. Would recommend follows up with provider Suella Broad who refills this medication. Recommend follow up with PCP to optimize bone health ie DEXA scan, vitamin D and calcium and bisphosphates.   SPLINT APPLICATION Date/Time: 69:48 AM Authorized by: Lattie Haw Consent: Verbal consent obtained. Risks and benefits: risks, benefits and alternatives were  discussed Consent given by: Patient Splint applied by: Wille Glaser Orthopedic technician Location details: [facility] Splint type: posterior back slab  Supplies used: Fibroglass, ace wrap and sling  Post-procedure: The splinted body part was neurovascularly unchanged following the procedure. Patient tolerance: Patient tolerated the procedure well with no immediate complications.   Final Clinical Impression(s) / ED Diagnoses Final diagnoses:  None    Rx / DC Orders ED Discharge Orders    None       Lattie Haw, MD 03/29/20 1040    Long, Wonda Olds, MD 03/29/20 1048

## 2020-03-29 NOTE — ED Triage Notes (Signed)
Pt states that she fell this morning coming down the stairs, she says that she could have tripped on her cane but she's not sure and doesn't remember, she complains of left arm pain from this fall Pt also states that she's been falling a lot recently and has an old wrist injury from that fall

## 2020-03-30 ENCOUNTER — Ambulatory Visit: Payer: Medicare Other | Admitting: Orthopaedic Surgery

## 2020-04-03 ENCOUNTER — Other Ambulatory Visit: Payer: Self-pay

## 2020-04-03 ENCOUNTER — Encounter (HOSPITAL_COMMUNITY): Payer: Self-pay | Admitting: Orthopedic Surgery

## 2020-04-03 NOTE — Anesthesia Preprocedure Evaluation (Addendum)
Anesthesia Evaluation  Patient identified by MRN, date of birth, ID band Patient awake    Reviewed: Allergy & Precautions, NPO status , Patient's Chart, lab work & pertinent test results  Airway Mallampati: I  TM Distance: >3 FB Neck ROM: Full    Dental  (+) Partial Upper, Dental Advisory Given,    Pulmonary Current Smoker,  34 pack year history, 1/2 ppd No inhalers    Pulmonary exam normal breath sounds clear to auscultation       Cardiovascular + Past MI (2018, no tx per pt)  Normal cardiovascular exam Rhythm:Regular Rate:Normal  Echo 2019: - Procedure narrative: Transthoracic echocardiography. Image  quality was adequate. The study was technically difficult.  - Left ventricle: The cavity size was normal. Systolic function was  normal. The estimated ejection fraction was in the range of 60%  to 65%. Mild basal septal hypertrophy. Wall motion was normal;  there were no regional wall motion abnormalities. Left  ventricular diastolic function parameters were normal.  - Atrial septum: No defect or patent foramen ovale was identified.  - Inferior vena cava: The vessel was dilated. The respirophasic  diameter changes were blunted (< 50%), consistent with elevated  central venous pressure. Estimated CVP 15 mmHg.    Neuro/Psych  Headaches (hemiplegic migraines), PSYCHIATRIC DISORDERS Anxiety Depression TIA (2014)   GI/Hepatic Neg liver ROS, GERD  Medicated and Controlled,  Endo/Other  negative endocrine ROS  Renal/GU negative Renal ROS  negative genitourinary   Musculoskeletal  (+) Arthritis , Osteoarthritis,  Chronic LBP- takes percocet q6h x 30 years    Abdominal Normal abdominal exam  (+)   Peds  Hematology  (+) Blood dyscrasia, anemia ,   Anesthesia Other Findings   Reproductive/Obstetrics negative OB ROS                            Anesthesia Physical Anesthesia Plan  ASA:  III  Anesthesia Plan: General and Regional   Post-op Pain Management: GA combined w/ Regional for post-op pain   Induction: Intravenous  PONV Risk Score and Plan: Ondansetron, Dexamethasone, Midazolam and Treatment may vary due to age or medical condition  Airway Management Planned: Oral ETT  Additional Equipment: None  Intra-op Plan:   Post-operative Plan: Extubation in OR  Informed Consent: I have reviewed the patients History and Physical, chart, labs and discussed the procedure including the risks, benefits and alternatives for the proposed anesthesia with the patient or authorized representative who has indicated his/her understanding and acceptance.     Dental advisory given  Plan Discussed with: CRNA  Anesthesia Plan Comments:        Anesthesia Quick Evaluation

## 2020-04-03 NOTE — Progress Notes (Signed)
Ms Dorothy White denies chest pain or shortness of breath. Patient will arrive 3 hours pre- op and will be tested for Covid. Ms Dorothy White spoke softy and faded off and did not response numerous times- I kept calling her name and she said she was awake and ok, that it must be the phone. Ms Dorothy White could not tell the dose of all medications, she said Gabpentin 10 mg. I asked patient if there was anyone in the home who could help with information, she said yes, Dorothy White.  I obtained as much history from patient as she could answer.  I spoke with Dorothy White- he was unsure of all medications, but he confirmed that patient takes Hydromorphone, " it was a recent prescription".  Mr. Dorothy White has noticed that patient has be more lethargic since she started taken hydromorphone. Mr. Dorothy White said that he checked the prescription bottle a little earlier today and there were 4 pills left. I called [facility] and confirmed Hydromorphone, Percocet, Klonopin, and Gabapentin.  [facility] said that Ms Dorothy White picked up Hydromorphone on 03/30/20 around 3 pm and there were 42 pills in the bottle.  I spoke with Dr. Fransisco Beau about patient being lethargic and the amount of medications she had taken.  Dr. Fransisco Beau said to tell patient to only take prescribed amount, that if she is not coherent enough to sign consent, then surgery would have to be postponed.  I called [name], she put me on speaker phone-she and Dorothy White listened, I told her to only take medication as prescribed and that she will need to be able to sign consent.

## 2020-04-03 NOTE — H&P (Signed)
Dorothy White is an 69 y.o. female.   Chief Complaint: LEFT ARM PAIN  HPI: the patient is a 69 year old right-hand dominant female who fell on 03/29/20 causing an injury to the left upper extremity. She was seen in the emergency department and was treated with a splint.  She was seen in our office for further treatment.  We discussed the reason and rationale for surgical intervention to repair the fracture of the proximal ulna and the proximal humerus. She continues to have weakness, numbness, tingling, swelling, and stiffness. The patient is on a pain contract with Dr. Suella Broad. She is here today for surgery. She denies chest pain, shortness of breath, fever, chills, nausea, vomiting, or diarrhea.  Past Medical History:  Diagnosis Date  . Anemia    ?  Marland Kitchen Anxiety   . Arthritis    "everywhere" (04/25/2016)  . 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 Chronic back pain    "all over" (04/25/2016)  . Constipation   . Depression   . Diverticulitis   . GERD (gastroesophageal reflux disease)   . Head injury 2019  . Headache    "weekly" (04/25/2016)  . 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 History of blood transfusion   . HLD (hyperlipidemia)    hx (04/25/2016)  . 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 Mild CAD    a. 25% mLAD, otherwise no sig disease 01/2016 cath.  Marland Kitchen NICM (nonischemic cardiomyopathy) ([place])    a. EF 40-45% by echo 01/6225 at time of complicated migraine/neuro sx, 55-65% at time of cath 01/2016  . Stroke [facility]) 06/2013   "mini" stroke , ?possibly hemaplegic migraine per pt    Past Surgical History:  Procedure Laterality Date  . ANTERIOR APPROACH HEMI HIP ARTHROPLASTY Left 04/01/2017   Procedure: LEFT DIRECT ANTERIOR TOTAL HIP REPLACEMENT;  Surgeon: Leandrew Koyanagi, MD;  Location: [facility];  Service: Orthopedics;  Laterality: Left;  LEFT DIRECT ANTERIOR TOTAL HIP REPLACEMENT  . AUGMENTATION MAMMAPLASTY  1980  . BASAL  CELL CARCINOMA EXCISION     "tip of my nose"  . BUNIONECTOMY Bilateral 10/2003  . CARDIAC CATHETERIZATION N/A 02/20/2016   Procedure: Left Heart Cath and Coronary Angiography;  Surgeon: Jettie Booze, MD;  Location: [facility];  Service: Cardiovascular;  Laterality: N/A;  . COLONOSCOPY    . DILATION AND CURETTAGE OF UTERUS    . FRACTURE SURGERY    . IR GENERIC HISTORICAL  03/18/2016   IR SINUS/FIST TUBE CHK-NON GI 03/18/2016 Aletta Edouard, MD [facility]-INTER[facility] RAD  . LAPAROSCOPIC SIGMOID COLECTOMY N/A 04/25/2016   Procedure: LAPAROSCOPIC SIGMOID COLECTOMY;  Surgeon: Stark Klein, MD;  Location: [place];  Service: General;  Laterality: N/A;  . OOPHORECTOMY Bilateral ~ 1999  . ORIF HUMERUS FRACTURE Right 02/15/2015   Procedure: OPEN REDUCTION INTERNAL FIXATION (ORIF) PROXIMAL HUMERUS FRACTURE;  Surgeon: Netta Cedars, MD;  Location: [place];  Service: Orthopedics;  Laterality: Right;  . RHINOPLASTY  1976  . TONSILLECTOMY    . TUBAL LIGATION  ~ 1983  . VAGINAL HYSTERECTOMY  ~ 1998    Family History  Problem Relation Age of Onset  . Lung cancer Mother 79  . Migraines Mother   . Heart attack Father        Vague  . Hypertension Brother   . Esophageal cancer Brother   . Diabetes Paternal Grandmother        questionable  . Colon cancer  Neg Hx   . Kidney disease Neg Hx   . Liver disease Neg Hx    Social History:  reports that she has been smoking cigarettes. She has a 8.50 pack-year smoking history. She has never used smokeless tobacco. She reports previous alcohol use of about 1.0 standard drink of alcohol per week. She reports that she does not use drugs.  Allergies:  Allergies  Allergen Reactions  . Opana [Oxymorphone Hcl] Other (See Comments)    hallucinations  . Penicillins Hives    Has patient had a PCN reaction causing immediate rash, facial/tongue/throat swelling, SOB or lightheadedness with hypotension: Unknown Has patient had a PCN reaction causing severe rash involving  mucus membranes or skin necrosis: No Has patient had a PCN reaction that required hospitalization No Has patient had a PCN reaction occurring within the last 10 years: No If all of the above answers are "NO", then may proceed with Cephalosporin use.     No medications prior to admission.    No results found for this or any previous visit (from the past 48 hour(s)). No results found.  ROS NO RECENT ILLNESSES OR HOSPITALIZATIONS  There were no vitals taken for this visit. Physical Exam  General Appearance:  Alert, cooperative, no distress, appears stated age  Head:  Normocephalic, without obvious abnormality, atraumatic  Eyes:  Pupils equal, conjunctiva/corneas clear,         Throat: Lips, mucosa, and tongue normal; teeth and gums normal  Neck: No visible masses     Lungs:   respirations unlabored  Chest Wall:  No tenderness or deformity  Heart:  Regular rate and rhythm,  Abdomen:   Soft, non-tender,         Extremities: LUE: LONG ARM SPLINT IN PLACE, FINGERS WARM WELL PERFUSED ABLE TO EXTEND THUMB AND EXTEND FINGERS FINGERS WARM WELL PERFUSED  Pulses: 2+ and symmetric  Skin: Skin color, texture, turgor normal, no rashes or lesions     Neurologic: Normal    Assessment/Plan LEFT PROXIMAL ULNA FRACTURE-DR Dorothy White    - LEFT ELBOW OPEN REDUCTION AND INTERNAL FIXATION WITH REPAIR AS INDICATED  LEFT SHOULDER PROXIMAL HUMERUS FRACTURE-DR. NORRIS   - LEFT SHOULDER OPEN REDUCTION AND INTERNAL FIXATION WITH REPAIR AS INDICATED    WE ARE PLANNING SURGERY FOR YOUR UPPER EXTREMITY. THE RISKS AND BENEFITS OF SURGERY INCLUDE BUT NOT LIMITED TO BLEEDING INFECTION, DAMAGE TO NEARBY NERVES ARTERIES TENDONS, FAILURE OF SURGERY TO ACCOMPLISH ITS INTENDED GOALS, PERSISTENT SYMPTOMS AND NEED FOR FURTHER SURGICAL INTERVENTION. WITH THIS IN MIND WE WILL PROCEED. I HAVE DISCUSSED WITH THE PATIENT THE PRE AND POSTOPERATIVE REGIMEN AND THE DOS AND DON'TS. PT VOICED UNDERSTANDING AND INFORMED  CONSENT SIGNED.  R/B/A DISCUSSED WITH PT IN OFFICE.  PT VOICED UNDERSTANDING OF PLAN CONSENT SIGNED DAY OF SURGERY PT SEEN AND EXAMINED PRIOR TO OPERATIVE PROCEDURE/DAY OF SURGERY SITE MARKED. QUESTIONS ANSWERED WILL REMAIN OVERNIGHT OBS FOLLOWING SURGERY   Brietta Manso [facility] MD   Brynda Peon 04/03/2020, 5:33 PM

## 2020-04-03 NOTE — Progress Notes (Deleted)
Assessment/Plan:    ***  Subjective:   Dorothy White was seen today in the movement disorders clinic for neurologic consultation at the request of Maurice Small, MD.  The consultation is for the evaluation of possible PD.  Pt previously under the care of Dr. Jannifer Franklin and Dr. Krista Blue.  Those records are reviewed.  Pt is a 69 y/o female with h/o of hemiplegic migraine.  She did fairly well with Topamax.  In his December, 2018 the patient came to Dr. Jannifer Franklin after she fell and broke her hip.  She apparently had a syncopal episode associated with a fall.  She had a similar episode 2 years prior to that where had a syncopal episode, fell and fractured her right shoulder.  On examination, Dr. Jannifer Franklin noted that patient's gait was wide-based and decreased arm swing on the left.  He noted that he was going to watch out for the development of Parkinson's, but did not think she had it.  In 2019, she began to complain about multiple falls and difficulty ambulating.  She saw Dr. Jannifer Franklin because of that.  He noted that the patient had a "progressive gait disorder."  He stated that she did not have "true features of Parkinson's disease today."  It appears that the patient no showed for a few appointments following that and was discharged from their clinic.  Records from the ER indicates she has been in the emergency room several times with falls over the years.  In fact, she had an appointment here last week but could not come because she was in the emergency room after a fall in which she fractured her ulna and humerus.  She had a surgery on that on September 8.  She was in the emergency room in 2019 with her brother complaining about worsening cognitive trouble.   Specific Symptoms:  Tremor: {yes no:314532} Family hx of similar:  {yes no:314532} Voice: *** Sleep: ***  Vivid Dreams:  {yes no:314532}  Acting out dreams:  {yes no:314532} Wet Pillows: {yes no:314532} Postural symptoms:  Yes.    Falls?  Yes.   Multiple, resulting in history of fractures and history of subdural hematoma in 2019, for which she has seen neurosurgery. Bradykinesia symptoms: {parkinson brady:18041} Loss of smell:  {yes no:314532} Loss of taste:  {yes no:314532} Urinary Incontinence:  {yes no:314532} Difficulty Swallowing:  {yes no:314532} Handwriting, micrographia: {yes no:314532} Trouble with ADL's:  {yes no:314532}  Trouble buttoning clothing: {yes no:314532} Depression:  {yes no:314532} Memory changes:  {yes no:314532} Hallucinations:  {yes no:314532}  visual distortions: {yes no:314532} N/V:  {yes no:314532} Lightheaded:  {yes no:314532}  Syncope: {yes no:314532} Diplopia:  {yes no:314532} Dyskinesia:  {yes no:314532} Prior exposure to reglan/antipsychotics: {yes no:314532}  Neuroimaging of the brain has *** previously been performed.  It *** available for my review today.  Last CT brain was March, 2020.  There was atrophy, but it was unremarkable otherwise.  PREVIOUS MEDICATIONS: {Parkinson's RX:18200} Topamax  ALLERGIES:   Allergies  Allergen Reactions  . Opana [Oxymorphone Hcl] Other (See Comments)    hallucinations  . Penicillins Hives    Has patient had a PCN reaction causing immediate rash, facial/tongue/throat swelling, SOB or lightheadedness with hypotension: Unknown Has patient had a PCN reaction causing severe rash involving mucus membranes or skin necrosis: No Has patient had a PCN reaction that required hospitalization No Has patient had a PCN reaction occurring within the last 10 years: No If all of the above answers are "NO", then may  proceed with Cephalosporin use.     CURRENT MEDICATIONS:  Current Outpatient Medications  Medication Instructions  . feeding supplement, ENSURE ENLIVE, (ENSURE ENLIVE) LIQD 237 mLs, Oral, 3 times daily between meals  . FLUoxetine (PROZAC) 80 mg, Oral, Daily  . LUTEIN PO 1 capsule, Oral, Daily  . Magnesium 500 mg, Oral, Daily at bedtime  . mometasone  (NASONEX) 50 MCG/ACT nasal spray 2 sprays, Nasal, Daily  . Multiple Vitamin (MULTIVITAMIN WITH MINERALS) TABS tablet 1 tablet, Oral, Daily  . omeprazole (PRILOSEC) 40 mg, Oral, Daily  . ondansetron (ZOFRAN) 8 mg, Oral, Every 8 hours PRN  . oxyCODONE-acetaminophen (PERCOCET/ROXICET) 5-325 MG tablet 1-2 tablets, Oral, Every 4 hours PRN  . SUMAtriptan (IMITREX) 50-100 mg, Oral, Every 2 hours PRN  . tiZANidine (ZANAFLEX) 4 mg, Oral, 3 times daily PRN  . topiramate (TOPAMAX) 100 mg, Oral, Daily at bedtime  . trazodone (DESYREL) 300 mg, Oral, At bedtime PRN  . vitamin B-12 (CYANOCOBALAMIN) 1,000 mcg, Oral, Daily  . vitamin E 100 Units, Oral, Daily    Objective:   VITALS:  There were no vitals filed for this visit.  GEN:  The patient appears stated age and is in NAD. HEENT:  Normocephalic, atraumatic.  The mucous membranes are moist. The superficial temporal arteries are without ropiness or tenderness. CV:  RRR Lungs:  CTAB Neck/HEME:  There are no carotid bruits bilaterally.  Neurological examination:  Orientation: The patient is alert and oriented x3.  Cranial nerves: There is good facial symmetry. Extraocular muscles are intact. The visual fields are full to confrontational testing. The speech is fluent and clear. Soft palate rises symmetrically and there is no tongue deviation. Hearing is intact to conversational tone. Sensation: Sensation is intact to light and pinprick throughout (facial, trunk, extremities). Vibration is intact at the bilateral big toe. There is no extinction with double simultaneous stimulation. There is no sensory dermatomal level identified. Motor: Strength is 5/5 in the bilateral upper and lower extremities.   Shoulder shrug is equal and symmetric.  There is no pronator drift. Deep tendon reflexes: Deep tendon reflexes are 2/4 at the bilateral biceps, triceps, brachioradialis, patella and achilles. Plantar responses are downgoing bilaterally.  Movement  examination: Tone: There is ***tone in the bilateral upper extremities.  The tone in the lower extremities is ***.  Abnormal movements: *** Coordination:  There is *** decremation with RAM's, *** Gait and Station: The patient has *** difficulty arising out of a deep-seated chair without the use of the hands. The patient's stride length is ***.  The patient has a *** pull test.     I have reviewed and interpreted the following labs independently   Chemistry      Component Value Date/Time   NA 140 07/03/2018 0338   NA 147 04/13/2017 0000   K 3.6 07/03/2018 0338   CL 112 (H) 07/03/2018 0338   CO2 19 (L) 07/03/2018 0338   BUN 18 07/03/2018 0338   BUN 11 04/13/2017 0000   CREATININE 0.96 07/03/2018 0338   CREATININE 0.95 02/15/2016 1536   GLU 93 04/13/2017 0000      Component Value Date/Time   CALCIUM 8.6 (L) 07/03/2018 0338   CALCIUM 9.4 01/13/2007 2356   ALKPHOS 84 07/02/2018 1903   AST 12 (L) 07/02/2018 1903   ALT 9 07/02/2018 1903   BILITOT 0.7 07/02/2018 1903      Lab Results  Component Value Date   TSH 0.720 12/17/2017   Lab Results  Component Value Date  WBC 5.9 07/02/2018   HGB 12.6 07/02/2018   HCT 41.6 07/02/2018   MCV 87.6 07/02/2018   PLT 280 07/02/2018   Patient had lab work on March 26, 2020.  White blood cells 5.8, hemoglobin 13.4, hematocrit 39.8 and platelets 268.  Sedimentation rate 17.  Sodium was 140, potassium 4.5, chloride 106, CO2 28, BUN 14, creatinine 0.90, AST 17, ALT 12, TSH 0.66.  Last B12 was in May, 2021 and was greater than 1500.  Total time spent on today's visit was ***60 minutes, including both face-to-face time and nonface-to-face time.  Time included that spent on review of records (prior notes available to me/labs/imaging if pertinent), discussing treatment and goals, answering patient's questions and coordinating care.  Cc:  Maurice Small, MD

## 2020-04-04 ENCOUNTER — Ambulatory Visit (HOSPITAL_COMMUNITY): Payer: Medicare Other | Admitting: Anesthesiology

## 2020-04-04 ENCOUNTER — Encounter (HOSPITAL_COMMUNITY): Payer: Self-pay | Admitting: Orthopedic Surgery

## 2020-04-04 ENCOUNTER — Encounter (HOSPITAL_COMMUNITY)
Admission: RE | Disposition: A | Discharge status: Home / Self Care | Payer: Self-pay | Source: Home / Self Care | Attending: Orthopedic Surgery

## 2020-04-04 ENCOUNTER — Ambulatory Visit (HOSPITAL_COMMUNITY): Payer: Medicare Other

## 2020-04-04 ENCOUNTER — Observation Stay (HOSPITAL_COMMUNITY)
Admission: RE | Admit: 2020-04-04 | Discharge: 2020-04-05 | Disposition: A | Discharge status: Home / Self Care | Payer: Medicare Other | Attending: Orthopedic Surgery | Admitting: Orthopedic Surgery

## 2020-04-04 DIAGNOSIS — F419 Anxiety disorder, unspecified: Secondary | ICD-10-CM | POA: Diagnosis not present

## 2020-04-04 DIAGNOSIS — E785 Hyperlipidemia, unspecified: Secondary | ICD-10-CM | POA: Diagnosis not present

## 2020-04-04 DIAGNOSIS — S52009A Unspecified fracture of upper end of unspecified ulna, initial encounter for closed fracture: Secondary | ICD-10-CM | POA: Diagnosis present

## 2020-04-04 DIAGNOSIS — G43909 Migraine, unspecified, not intractable, without status migrainosus: Secondary | ICD-10-CM | POA: Diagnosis not present

## 2020-04-04 DIAGNOSIS — Y9389 Activity, other specified: Secondary | ICD-10-CM | POA: Insufficient documentation

## 2020-04-04 DIAGNOSIS — S52022A Displaced fracture of olecranon process without intraarticular extension of left ulna, initial encounter for closed fracture: Secondary | ICD-10-CM | POA: Diagnosis present

## 2020-04-04 DIAGNOSIS — Y9289 Other specified places as the place of occurrence of the external cause: Secondary | ICD-10-CM | POA: Diagnosis not present

## 2020-04-04 DIAGNOSIS — F329 Major depressive disorder, single episode, unspecified: Secondary | ICD-10-CM | POA: Insufficient documentation

## 2020-04-04 DIAGNOSIS — Z20822 Contact with and (suspected) exposure to covid-19: Secondary | ICD-10-CM | POA: Insufficient documentation

## 2020-04-04 DIAGNOSIS — Y999 Unspecified external cause status: Secondary | ICD-10-CM | POA: Diagnosis not present

## 2020-04-04 DIAGNOSIS — R2681 Unsteadiness on feet: Secondary | ICD-10-CM | POA: Insufficient documentation

## 2020-04-04 DIAGNOSIS — W1830XA Fall on same level, unspecified, initial encounter: Secondary | ICD-10-CM | POA: Insufficient documentation

## 2020-04-04 HISTORY — PX: ORIF ULNAR FRACTURE: SHX5417

## 2020-04-04 HISTORY — PX: ORIF HUMERUS FRACTURE: SHX2126

## 2020-04-04 HISTORY — DX: Personal history of other medical treatment: Z92.89

## 2020-04-04 LAB — CBC
HCT: 40.4 % (ref 36.0–46.0)
Hemoglobin: 12.1 g/dL (ref 12.0–15.0)
MCH: 27.6 pg (ref 26.0–34.0)
MCHC: 30 g/dL (ref 30.0–36.0)
MCV: 92 fL (ref 80.0–100.0)
Platelets: 252 10*3/uL (ref 150–400)
RBC: 4.39 MIL/uL (ref 3.87–5.11)
RDW: 13.9 % (ref 11.5–15.5)
WBC: 6.3 10*3/uL (ref 4.0–10.5)
nRBC: 0 % (ref 0.0–0.2)

## 2020-04-04 LAB — COMPREHENSIVE METABOLIC PANEL
ALT: 10 U/L (ref 0–44)
AST: 13 U/L — ABNORMAL LOW (ref 15–41)
Albumin: 3.6 g/dL (ref 3.5–5.0)
Alkaline Phosphatase: 109 U/L (ref 38–126)
Anion gap: 12 (ref 5–15)
BUN: 15 mg/dL (ref 8–23)
CO2: 25 mmol/L (ref 22–32)
Calcium: 9.2 mg/dL (ref 8.9–10.3)
Chloride: 98 mmol/L (ref 98–111)
Creatinine, Ser: 0.7 mg/dL (ref 0.44–1.00)
GFR calc Af Amer: >60 mL/min (ref >60)
GFR calc non Af Amer: >60 mL/min (ref >60)
Glucose, Bld: 98 mg/dL (ref 70–99)
Potassium: 3.9 mmol/L (ref 3.5–5.1)
Sodium: 135 mmol/L (ref 135–145)
Total Bilirubin: 1 mg/dL (ref 0.3–1.2)
Total Protein: 6.5 g/dL (ref 6.5–8.1)

## 2020-04-04 LAB — SARS CORONAVIRUS 2 BY RT PCR (HOSPITAL ORDER, PERFORMED IN ~~LOC~~ HOSPITAL LAB): SARS Coronavirus 2: NEGATIVE

## 2020-04-04 SURGERY — OPEN REDUCTION INTERNAL FIXATION (ORIF) ULNAR FRACTURE
Anesthesia: Regional | Laterality: Left

## 2020-04-04 MED ORDER — DEXAMETHASONE SODIUM PHOSPHATE 10 MG/ML IJ SOLN
INTRAMUSCULAR | Status: AC
  Filled 2020-04-04: qty 1

## 2020-04-04 MED ORDER — PHENYLEPHRINE HCL-NACL 10-0.9 MG/250ML-% IV SOLN
INTRAVENOUS | Status: DC | PRN
  Administered 2020-04-04: 30 ug/min via INTRAVENOUS

## 2020-04-04 MED ORDER — CHLORHEXIDINE GLUCONATE 0.12 % MT SOLN: 15.0000 mL | OROMUCOSAL | Status: AC

## 2020-04-04 MED ORDER — MIDAZOLAM HCL 2 MG/2ML IJ SOLN
INTRAMUSCULAR | Status: AC
  Administered 2020-04-04: 1 mg via INTRAVENOUS
  Filled 2020-04-04: qty 2

## 2020-04-04 MED ORDER — CHLORHEXIDINE GLUCONATE 0.12 % MT SOLN
OROMUCOSAL | Status: AC
  Administered 2020-04-04: 15 mL via OROMUCOSAL
  Filled 2020-04-04: qty 15

## 2020-04-04 MED ORDER — VANCOMYCIN HCL IN DEXTROSE 1-5 GM/200ML-% IV SOLN
INTRAVENOUS | Status: AC
  Administered 2020-04-04: 1000 mg via INTRAVENOUS
  Filled 2020-04-04: qty 200

## 2020-04-04 MED ORDER — OXYCODONE HCL ER 10 MG PO T12A
10.0000 mg | EXTENDED_RELEASE_TABLET | Freq: Two times a day (BID) | ORAL | Status: DC
  Administered 2020-04-04 – 2020-04-05 (×2): 10 mg via ORAL
  Filled 2020-04-04 (×2): qty 1

## 2020-04-04 MED ORDER — MAGNESIUM CITRATE PO SOLN: 1.0000 | Freq: Once | ORAL | Status: DC | PRN

## 2020-04-04 MED ORDER — PROPOFOL 10 MG/ML IV BOLUS
INTRAVENOUS | Status: AC
  Filled 2020-04-04: qty 20

## 2020-04-04 MED ORDER — MIDAZOLAM HCL 2 MG/2ML IJ SOLN: 1.0000 mg | Freq: Once | INTRAMUSCULAR | Status: AC

## 2020-04-04 MED ORDER — ONDANSETRON HCL 4 MG/2ML IJ SOLN: 4.0000 mg | Freq: Once | INTRAMUSCULAR | Status: DC | PRN

## 2020-04-04 MED ORDER — DOCUSATE SODIUM 100 MG PO CAPS
100.0000 mg | ORAL_CAPSULE | Freq: Two times a day (BID) | ORAL | Status: DC
  Administered 2020-04-04 – 2020-04-05 (×2): 100 mg via ORAL
  Filled 2020-04-04 (×2): qty 1

## 2020-04-04 MED ORDER — 0.9 % SODIUM CHLORIDE (POUR BTL) OPTIME
TOPICAL | Status: DC | PRN
  Administered 2020-04-04: 1000 mL

## 2020-04-04 MED ORDER — ONDANSETRON HCL 4 MG/2ML IJ SOLN
INTRAMUSCULAR | Status: DC | PRN
  Administered 2020-04-04: 4 mg via INTRAVENOUS

## 2020-04-04 MED ORDER — FENTANYL CITRATE (PF) 100 MCG/2ML IJ SOLN
INTRAMUSCULAR | Status: AC
  Administered 2020-04-04: 50 ug via INTRAVENOUS
  Filled 2020-04-04: qty 2

## 2020-04-04 MED ORDER — BUPIVACAINE LIPOSOME 1.3 % IJ SUSP
INTRAMUSCULAR | Status: DC | PRN
  Administered 2020-04-04: 10 mL via PERINEURAL

## 2020-04-04 MED ORDER — HYDROMORPHONE HCL 1 MG/ML IJ SOLN: 0.2500 mg | INTRAMUSCULAR | Status: DC | PRN

## 2020-04-04 MED ORDER — FENTANYL CITRATE (PF) 250 MCG/5ML IJ SOLN
INTRAMUSCULAR | Status: DC | PRN
Start: 2020-04-04 — End: 2020-04-04
  Administered 2020-04-04: 50 ug via INTRAVENOUS

## 2020-04-04 MED ORDER — ZOLPIDEM TARTRATE 5 MG PO TABS: 5.0000 mg | ORAL_TABLET | Freq: Every evening | ORAL | Status: DC | PRN

## 2020-04-04 MED ORDER — POLYETHYLENE GLYCOL 3350 17 G PO PACK: 17.0000 g | PACK | Freq: Every day | ORAL | Status: DC | PRN

## 2020-04-04 MED ORDER — LIDOCAINE 2% (20 MG/ML) 5 ML SYRINGE
INTRAMUSCULAR | Status: DC | PRN
  Administered 2020-04-04: 20 mg via INTRAVENOUS

## 2020-04-04 MED ORDER — ONDANSETRON HCL 4 MG PO TABS: 4.0000 mg | ORAL_TABLET | Freq: Four times a day (QID) | ORAL | Status: DC | PRN

## 2020-04-04 MED ORDER — HYDROMORPHONE HCL 1 MG/ML IJ SOLN
0.5000 mg | INTRAMUSCULAR | Status: DC | PRN
  Administered 2020-04-05 (×2): 1 mg via INTRAVENOUS
  Filled 2020-04-04 (×2): qty 1

## 2020-04-04 MED ORDER — ACETAMINOPHEN 500 MG PO TABS: 1000.0000 mg | ORAL_TABLET | Freq: Once | ORAL | Status: AC

## 2020-04-04 MED ORDER — ACETAMINOPHEN 500 MG PO TABS
1000.0000 mg | ORAL_TABLET | Freq: Four times a day (QID) | ORAL | Status: AC
  Administered 2020-04-04 – 2020-04-05 (×4): 1000 mg via ORAL
  Filled 2020-04-04 (×4): qty 2

## 2020-04-04 MED ORDER — BUPIVACAINE HCL (PF) 0.25 % IJ SOLN
INTRAMUSCULAR | Status: DC | PRN
  Administered 2020-04-04: 10 mL

## 2020-04-04 MED ORDER — SUGAMMADEX SODIUM 200 MG/2ML IV SOLN
INTRAVENOUS | Status: DC | PRN
  Administered 2020-04-04: 120 mg via INTRAVENOUS

## 2020-04-04 MED ORDER — BUPIVACAINE HCL (PF) 0.25 % IJ SOLN
INTRAMUSCULAR | Status: AC
  Filled 2020-04-04: qty 30

## 2020-04-04 MED ORDER — OXYCODONE HCL 5 MG PO TABS
10.0000 mg | ORAL_TABLET | ORAL | Status: DC | PRN
  Administered 2020-04-05: 15 mg via ORAL
  Filled 2020-04-04: qty 3

## 2020-04-04 MED ORDER — VANCOMYCIN HCL IN DEXTROSE 1-5 GM/200ML-% IV SOLN: 1000.0000 mg | INTRAVENOUS | Status: AC

## 2020-04-04 MED ORDER — METHOCARBAMOL 500 MG PO TABS
500.0000 mg | ORAL_TABLET | Freq: Four times a day (QID) | ORAL | Status: DC | PRN
  Administered 2020-04-05: 500 mg via ORAL
  Filled 2020-04-04: qty 1

## 2020-04-04 MED ORDER — EPHEDRINE SULFATE-NACL 50-0.9 MG/10ML-% IV SOSY
PREFILLED_SYRINGE | INTRAVENOUS | Status: DC | PRN
  Administered 2020-04-04 (×3): 10 mg via INTRAVENOUS

## 2020-04-04 MED ORDER — ONDANSETRON HCL 4 MG/2ML IJ SOLN: 4.0000 mg | Freq: Four times a day (QID) | INTRAMUSCULAR | Status: DC | PRN

## 2020-04-04 MED ORDER — ROCURONIUM BROMIDE 10 MG/ML (PF) SYRINGE
PREFILLED_SYRINGE | INTRAVENOUS | Status: DC | PRN
  Administered 2020-04-04: 50 mg via INTRAVENOUS

## 2020-04-04 MED ORDER — DEXAMETHASONE SODIUM PHOSPHATE 10 MG/ML IJ SOLN
INTRAMUSCULAR | Status: DC | PRN
  Administered 2020-04-04: 5 mg via INTRAVENOUS

## 2020-04-04 MED ORDER — ACETAMINOPHEN 325 MG PO TABS: 325.0000 mg | ORAL_TABLET | Freq: Four times a day (QID) | ORAL | Status: DC | PRN

## 2020-04-04 MED ORDER — PROPOFOL 10 MG/ML IV BOLUS
INTRAVENOUS | Status: DC | PRN
  Administered 2020-04-04 (×4): 20 mg via INTRAVENOUS
  Administered 2020-04-04: 100 mg via INTRAVENOUS
  Administered 2020-04-04: 20 mg via INTRAVENOUS
  Administered 2020-04-04: 30 mg via INTRAVENOUS
  Administered 2020-04-04 (×2): 20 mg via INTRAVENOUS

## 2020-04-04 MED ORDER — EPHEDRINE 5 MG/ML INJ
INTRAVENOUS | Status: AC
  Filled 2020-04-04: qty 10

## 2020-04-04 MED ORDER — ALPRAZOLAM 0.5 MG PO TABS: 0.5000 mg | ORAL_TABLET | Freq: Four times a day (QID) | ORAL | Status: DC | PRN

## 2020-04-04 MED ORDER — LACTATED RINGERS IV SOLN: INTRAVENOUS | Status: DC

## 2020-04-04 MED ORDER — BISACODYL 5 MG PO TBEC: 5.0000 mg | DELAYED_RELEASE_TABLET | Freq: Every day | ORAL | Status: DC | PRN

## 2020-04-04 MED ORDER — OXYCODONE HCL 5 MG PO TABS: 5.0000 mg | ORAL_TABLET | Freq: Once | ORAL | Status: DC | PRN

## 2020-04-04 MED ORDER — BUPIVACAINE HCL (PF) 0.25 % IJ SOLN
INTRAMUSCULAR | Status: DC | PRN
  Administered 2020-04-04: 12 mL

## 2020-04-04 MED ORDER — ONDANSETRON HCL 4 MG/2ML IJ SOLN
INTRAMUSCULAR | Status: AC
  Filled 2020-04-04: qty 2

## 2020-04-04 MED ORDER — METHOCARBAMOL 1000 MG/10ML IJ SOLN
500.0000 mg | Freq: Four times a day (QID) | INTRAVENOUS | Status: DC | PRN
  Filled 2020-04-04: qty 5

## 2020-04-04 MED ORDER — OXYCODONE HCL 5 MG/5ML PO SOLN: 5.0000 mg | Freq: Once | ORAL | Status: DC | PRN

## 2020-04-04 MED ORDER — ACETAMINOPHEN 500 MG PO TABS
ORAL_TABLET | ORAL | Status: AC
  Administered 2020-04-04: 1000 mg via ORAL
  Filled 2020-04-04: qty 2

## 2020-04-04 MED ORDER — FAMOTIDINE 20 MG PO TABS: 20.0000 mg | ORAL_TABLET | Freq: Two times a day (BID) | ORAL | Status: DC | PRN

## 2020-04-04 MED ORDER — FENTANYL CITRATE (PF) 100 MCG/2ML IJ SOLN: 50.0000 ug | Freq: Once | INTRAMUSCULAR | Status: AC

## 2020-04-04 MED ORDER — OXYCODONE HCL 5 MG PO TABS
5.0000 mg | ORAL_TABLET | ORAL | Status: DC | PRN
  Administered 2020-04-05: 10 mg via ORAL
  Filled 2020-04-04: qty 2

## 2020-04-04 MED ORDER — KCL IN DEXTROSE-NACL 10-5-0.45 MEQ/L-%-% IV SOLN
INTRAVENOUS | Status: DC
  Filled 2020-04-04 (×2): qty 1000

## 2020-04-04 MED ORDER — LIDOCAINE 2% (20 MG/ML) 5 ML SYRINGE
INTRAMUSCULAR | Status: AC
  Filled 2020-04-04: qty 5

## 2020-04-04 MED ORDER — FENTANYL CITRATE (PF) 250 MCG/5ML IJ SOLN
INTRAMUSCULAR | Status: AC
  Filled 2020-04-04: qty 5

## 2020-04-04 MED ORDER — DIPHENHYDRAMINE HCL 25 MG PO CAPS: 25.0000 mg | ORAL_CAPSULE | Freq: Four times a day (QID) | ORAL | Status: DC | PRN

## 2020-04-04 MED ORDER — ROCURONIUM BROMIDE 10 MG/ML (PF) SYRINGE
PREFILLED_SYRINGE | INTRAVENOUS | Status: AC
  Filled 2020-04-04: qty 10

## 2020-04-04 SURGICAL SUPPLY — 102 items
BIT DRILL 2.5X2.75 QC CALB (BIT) ×3 IMPLANT
BIT DRILL 3.2 (BIT) ×3
BIT DRILL 3.2XCALB NS DISP (BIT) ×1 IMPLANT
BIT DRILL CALIBRATED 2.7 (BIT) ×4 IMPLANT
BIT DRILL CALIBRATED 2.7MM (BIT) ×2
BIT DRL 3.2XCALB NS DISP (BIT) ×1
BLADE CLIPPER SURG (BLADE) IMPLANT
BNDG CMPR 9X4 STRL LF SNTH (GAUZE/BANDAGES/DRESSINGS) ×1
BNDG ELASTIC 3X5.8 VLCR STR LF (GAUZE/BANDAGES/DRESSINGS) ×3 IMPLANT
BNDG ELASTIC 4X5.8 VLCR STR LF (GAUZE/BANDAGES/DRESSINGS) ×3 IMPLANT
BNDG ESMARK 4X9 LF (GAUZE/BANDAGES/DRESSINGS) ×3 IMPLANT
BNDG GAUZE ELAST 4 BULKY (GAUZE/BANDAGES/DRESSINGS) ×9 IMPLANT
CLOSURE WOUND 1/2 X4 (GAUZE/BANDAGES/DRESSINGS)
CORD BIPOLAR FORCEPS 12FT (ELECTRODE) ×6 IMPLANT
COVER SURGICAL LIGHT HANDLE (MISCELLANEOUS) ×6 IMPLANT
COVER WAND RF STERILE (DRAPES) IMPLANT
CUFF TOURN SGL QUICK 18X4 (TOURNIQUET CUFF) ×3 IMPLANT
CUFF TOURN SGL QUICK 24 (TOURNIQUET CUFF)
CUFF TRNQT CYL 24X4X16.5-23 (TOURNIQUET CUFF) IMPLANT
DRAPE EXTREMITY T 121X128X90 (DISPOSABLE) ×6 IMPLANT
DRAPE IMP U-DRAPE 54X76 (DRAPES) ×6 IMPLANT
DRAPE INCISE IOBAN 66X45 STRL (DRAPES) ×3 IMPLANT
DRAPE OEC MINIVIEW 54X84 (DRAPES) ×9 IMPLANT
DRAPE SURG 17X11 SM STRL (DRAPES) ×3 IMPLANT
DRSG ADAPTIC 3X8 NADH LF (GAUZE/BANDAGES/DRESSINGS) ×3 IMPLANT
DRSG PAD ABDOMINAL 8X10 ST (GAUZE/BANDAGES/DRESSINGS) ×3 IMPLANT
ELECT NEEDLE TIP 2.8 STRL (NEEDLE) ×3 IMPLANT
ELECT REM PT RETURN 9FT ADLT (ELECTROSURGICAL)
ELECTRODE REM PT RTRN 9FT ADLT (ELECTROSURGICAL) IMPLANT
GAUZE 4X4 16PLY RFD (DISPOSABLE) IMPLANT
GAUZE SPONGE 4X4 12PLY STRL (GAUZE/BANDAGES/DRESSINGS) ×6 IMPLANT
GAUZE XEROFORM 5X9 LF (GAUZE/BANDAGES/DRESSINGS) ×3 IMPLANT
GLOVE BIOGEL PI IND STRL 8.5 (GLOVE) ×1 IMPLANT
GLOVE BIOGEL PI INDICATOR 8.5 (GLOVE) ×2
GLOVE SURG ORTHO 8.0 STRL STRW (GLOVE) ×3 IMPLANT
GOWN STRL REUS W/ TWL LRG LVL3 (GOWN DISPOSABLE) ×2 IMPLANT
GOWN STRL REUS W/ TWL XL LVL3 (GOWN DISPOSABLE) ×1 IMPLANT
GOWN STRL REUS W/TWL LRG LVL3 (GOWN DISPOSABLE) ×6
GOWN STRL REUS W/TWL XL LVL3 (GOWN DISPOSABLE) ×3
K-WIRE 2X5 SS THRDED S3 (WIRE) ×9
K-WIRE FIXATION 2.0X6 (WIRE) ×6
KIT BASIN OR (CUSTOM PROCEDURE TRAY) ×3 IMPLANT
KIT TURNOVER KIT B (KITS) ×3 IMPLANT
KWIRE 2X5 SS THRDED S3 (WIRE) ×3 IMPLANT
KWIRE FIXATION 2.0X6 (WIRE) ×2 IMPLANT
MANIFOLD NEPTUNE II (INSTRUMENTS) IMPLANT
NEEDLE HYPO 25GX1X1/2 BEV (NEEDLE) IMPLANT
NEEDLE HYPO 25X1 1.5 SAFETY (NEEDLE) ×3 IMPLANT
NS IRRIG 1000ML POUR BTL (IV SOLUTION) ×3 IMPLANT
PACK ORTHO EXTREMITY (CUSTOM PROCEDURE TRAY) ×3 IMPLANT
PACK SHOULDER (CUSTOM PROCEDURE TRAY) ×3 IMPLANT
PACK UNIVERSAL I (CUSTOM PROCEDURE TRAY) ×3 IMPLANT
PAD ABD 8X10 STRL (GAUZE/BANDAGES/DRESSINGS) ×3 IMPLANT
PAD ARMBOARD 7.5X6 YLW CONV (MISCELLANEOUS) ×6 IMPLANT
PAD CAST 4YDX4 CTTN HI CHSV (CAST SUPPLIES) ×1 IMPLANT
PADDING CAST COTTON 4X4 STRL (CAST SUPPLIES) ×3
PEG LOCKING 3.2MMX44 (Peg) ×3 IMPLANT
PEG LOCKING 3.2MMX46 (Peg) ×3 IMPLANT
PEG LOCKING 3.2X34 (Screw) ×3 IMPLANT
PEG LOCKING 3.2X36 (Screw) ×6 IMPLANT
PEG LOCKING 3.2X42 (Screw) ×3 IMPLANT
PENCIL BUTTON HOLSTER BLD 10FT (ELECTRODE) ×3 IMPLANT
PLATE OLECRANON SM (Plate) ×3 IMPLANT
PLATE PROX HUMERUS 4H LEFT LOW (Plate) ×3 IMPLANT
SCREW CORT T15 24X3.5XST LCK (Screw) ×1 IMPLANT
SCREW CORTICAL 3.5X24MM (Screw) ×3 IMPLANT
SCREW CORTICAL LOW PROF 3.5X20 (Screw) ×3 IMPLANT
SCREW LOCK CORT STAR 3.5X16 (Screw) ×3 IMPLANT
SCREW LOCK CORT STAR 3.5X18 (Screw) ×3 IMPLANT
SCREW LOCK CORT STAR 3.5X20 (Screw) ×3 IMPLANT
SCREW LOCK CORT STAR 3.5X24 (Screw) ×3 IMPLANT
SCREW LOCK CORT STAR 3.5X26 (Screw) ×6 IMPLANT
SCREW LP 3.5X60MM (Screw) ×3 IMPLANT
SCREW LP NL T15 3.5X24 (Screw) ×9 IMPLANT
SCREW LP NL T15 3.5X26 (Screw) ×6 IMPLANT
SLEEVE MEASURING 3.2 (BIT) ×3 IMPLANT
SLING ARM FOAM STRAP LRG (SOFTGOODS) ×6 IMPLANT
SOAP 2 % CHG 4 OZ (WOUND CARE) ×3 IMPLANT
SPONGE LAP 4X18 RFD (DISPOSABLE) IMPLANT
STAPLER VISISTAT 35W (STAPLE) ×3 IMPLANT
STOCKINETTE 6  STRL (DRAPES) ×3
STOCKINETTE 6 STRL (DRAPES) ×1 IMPLANT
STRIP CLOSURE SKIN 1/2X4 (GAUZE/BANDAGES/DRESSINGS) IMPLANT
SUCTION FRAZIER HANDLE 10FR (MISCELLANEOUS) ×3
SUCTION FRAZIER TIP 8 FR DISP (SUCTIONS) ×3
SUCTION TUBE FRAZIER 10FR DISP (MISCELLANEOUS) ×1 IMPLANT
SUCTION TUBE FRAZIER 8FR DISP (SUCTIONS) ×1 IMPLANT
SUT MNCRL AB 3-0 PS2 27 (SUTURE) ×9 IMPLANT
SUT PROLENE 3 0 PS 2 (SUTURE) ×3 IMPLANT
SUT PROLENE 4 0 PS 2 18 (SUTURE) IMPLANT
SUT VIC AB 0 CT1 27 (SUTURE) ×3
SUT VIC AB 0 CT1 27XBRD ANBCTR (SUTURE) ×1 IMPLANT
SUT VIC AB 2-0 CT1 27 (SUTURE) ×3
SUT VIC AB 2-0 CT1 TAPERPNT 27 (SUTURE) ×1 IMPLANT
SUT VIC AB 4-0 PS2 18 (SUTURE) IMPLANT
SYR CONTROL 10ML LL (SYRINGE) IMPLANT
TOWEL GREEN STERILE (TOWEL DISPOSABLE) ×3 IMPLANT
TOWEL GREEN STERILE FF (TOWEL DISPOSABLE) ×3 IMPLANT
TUBE CONNECTING 12'X1/4 (SUCTIONS) ×2
TUBE CONNECTING 12X1/4 (SUCTIONS) ×4 IMPLANT
WASHER 3.5MM (Orthopedic Implant) ×3 IMPLANT
WATER STERILE IRR 1000ML POUR (IV SOLUTION) ×3 IMPLANT

## 2020-04-04 NOTE — Op Note (Signed)
PREOPERATIVE DIAGNOSIS: Displaced left proximal olecranon fracture  POSTOPERATIVE DIAGNOSIS: Same  ATTENDING SURGEON: Dr. Iran Planas who scrubbed and present for the entire procedure  ASSISTANT SURGEON: Samantha B. Carron Brazen, PA-C was scrubbed and necessary for internal fixation closure splinting in a timely fashion  ANESTHESIA: Regional block with general anesthesia.  OPERATIVE PROCEDURE: Open treatment of left proximal ulna displaced fracture with internal fixation Radiographs 3 views left elbow  IMPLANTS: Biomet proximal olecranon plate combination of locking nonlocking screws  RADIOGRAPHIC INTERPRETATION: AP lateral oblique views of the elbow do show the dorsal plate fixation in place in good position with good alignment of the ulnohumeral and radiocapitellar joint intervals  SURGICAL INDICATIONS: Patient is a right-hand-dominant female who sustained a fall down some stairs sustaining a closed injury to her left proximal ulna and proximal humerus.  Patient was seen evaluate the office and recommended undergo the above procedure.  The risks of surgery include but not limited to bleeding infection damage nearby nerves arteries or tendons nonunion malunion hardware failure loss of motion of the wrist and digits incomplete relief of symptoms and need for further surgical invention.  SURGICAL TECHNIQUE: Patient palpated find the preoperative holding area marked apart a marker made the left elbow and indicate correct operative site.  Patient brought back operating placed supine on anesthesia table where the regional anesthetic had been administered.  Patient tolerated the general anesthetic.  Preoperative antibiotics were given prior to any skin incision.  Well-padded tourniquet placed on the left brachium seal with appropriate drape.  Left upper extremities then prepped and draped normal sterile fashion.  A timeout was called the correct site identified procedure then begun.  Attention was then  turned to the left elbow.  Posterior incision was then made curvilinear along the radial olecranon tip.  Dissection carried down through the skin and subcutaneous tissue.  Deep dissection carried down to identify the fascia and the fascia was incised longitudinally exposing the proximal ulna.  Thick fascial flaps were maintained throughout.  The fracture site was then identified and fracture hematoma was then evacuated.  The wound was then thoroughly irrigated.  Open reduction was then carried out.  The patient did have the significant comminution within the proximal ulna segment.  The proximal ulna plate was then applied after reduction maintained with reduction clamps.  Once this was carried out the plate was then placed along the posterior surface of the ulna and held proximally distally with K wires.  Plate confirmation was again using the mini C arm.  Following this the proximal screws were then placed with a 2.7 mm drill bit locking screws to locking screws proximally.  Following this the oblong screw hole was then placed with a 2.5 mm drill bit and three 5 mm screw loading and and compression.  Once this was carried out the radiographs were then obtained which showed good anatomical reduction.  Combination of locking nonlocking screws were then placed distally.  Following this the oblique or homerun screw was then placed with the aid of the mini C arm.  This had a good purchase engaging the opposite engaging cortices along the proximal ulna.  The wound was then thoroughly irrigated.  Final radiographs were then obtained.  The fascia layer was then closed with 2-0 Vicryl subcutaneous tissues closed with Monocryl and the skin closed with simple 3-0 Prolene.  Xeroform dressing sterile compressive bandage then applied.  The patient placed in a long-arm posterior splint.  At the conclusion of the elbow fracture Dr. Alma Friendly  took over for fixation of the proximal humerus.  POSTOPERATIVE PLAN: Patient be admitted  overnight for IV antibiotics and pain control.  Discharged when her pain is controlled.  See him back in the office in 2 weeks for wound check suture removal x-rays placed in order today for her first postoperative visit to be fitted for a long-arm splint.  Begin working on some gentle active range of motion at the first postoperative visit.  Radiographs each visit.

## 2020-04-04 NOTE — Anesthesia Procedure Notes (Signed)
Anesthesia Regional Block: Interscalene brachial plexus block   Pre-Anesthetic Checklist: ,, timeout performed, Correct Patient, Correct Site, Correct Laterality, Correct Procedure, Correct Position, site marked, Risks and benefits discussed,  Surgical consent,  Pre-op evaluation,  At surgeon's request and post-op pain management  Laterality: Left  Prep: Maximum Sterile Barrier Precautions used, chloraprep       Needles:  Injection technique: Single-shot  Needle Type: Echogenic Stimulator Needle     Needle Length: 9cm  Needle Gauge: 22     Additional Needles:   Procedures:,,,, ultrasound used (permanent image in chart),,,,  Narrative:  Start time: 04/04/2020 2:00 PM End time: 04/04/2020 2:10 PM Injection made incrementally with aspirations every 5 mL.  Performed by: Personally  Anesthesiologist: Pervis Hocking, DO  Additional Notes: Monitors applied. No increased pain on injection. No increased resistance to injection. Injection made in 5cc increments. Good needle visualization. Patient tolerated procedure well.

## 2020-04-04 NOTE — Anesthesia Procedure Notes (Signed)
Procedure Name: Intubation Date/Time: 04/04/2020 2:41 PM Performed by: Janace Litten, CRNA Pre-anesthesia Checklist: Patient identified, Emergency Drugs available, Suction available and Patient being monitored Patient Re-evaluated:Patient Re-evaluated prior to induction Oxygen Delivery Method: Circle System Utilized Preoxygenation: Pre-oxygenation with 100% oxygen Induction Type: IV induction Ventilation: Mask ventilation without difficulty Laryngoscope Size: Mac Tube type: Oral Tube size: 7.0 mm Number of attempts: 1 Airway Equipment and Method: Stylet Placement Confirmation: ETT inserted through vocal cords under direct vision,  positive ETCO2 and breath sounds checked- equal and bilateral Secured at: 21 cm Tube secured with: Tape Dental Injury: Teeth and Oropharynx as per pre-operative assessment

## 2020-04-04 NOTE — Transfer of Care (Signed)
Immediate Anesthesia Transfer of Care Note  Patient: Dorothy White  Procedure(s) Performed: OPEN REDUCTION INTERNAL FIXATION (ORIF) ULNAR FRACTURE (Left ) OPEN REDUCTION INTERNAL FIXATION (ORIF) PROXIMAL HUMERUS FRACTURE (Left )  Patient Location: PACU  Anesthesia Type:GA combined with regional for post-op pain  Level of Consciousness: awake  Airway & Oxygen Therapy: Patient Spontanous Breathing and Patient connected to face mask oxygen  Post-op Assessment: Report given to RN and Post -op Vital signs reviewed and stable  Post vital signs: Reviewed and stable  Last Vitals:  Vitals Value Taken Time  BP    Temp    Pulse    Resp    SpO2      Last Pain:  Vitals:   04/04/20 1236  TempSrc:   PainSc: 10-Worst pain ever      Patients Stated Pain Goal: 5 (03/47/42 5956)  Complications: No complications documented.

## 2020-04-05 ENCOUNTER — Encounter (HOSPITAL_COMMUNITY): Payer: Self-pay | Admitting: Orthopedic Surgery

## 2020-04-05 ENCOUNTER — Ambulatory Visit: Payer: Medicare Other | Admitting: Neurology

## 2020-04-05 DIAGNOSIS — S52022A Displaced fracture of olecranon process without intraarticular extension of left ulna, initial encounter for closed fracture: Secondary | ICD-10-CM | POA: Diagnosis not present

## 2020-04-05 NOTE — Discharge Instructions (Signed)
KEEP BANDAGE CLEAN AND DRY °CALL OFFICE FOR F/U APPT [phone] °KEEP HAND ELEVATED ABOVE HEART °OK TO APPLY ICE TO OPERATIVE AREA °CONTACT OFFICE IF ANY WORSENING PAIN OR CONCERNS. °

## 2020-04-05 NOTE — Plan of Care (Signed)
  Problem: Education: Goal: Knowledge of General Education information will improve Description: Including pain rating scale, medication(s)/side effects and non-pharmacologic comfort measures Outcome: Progressing   Problem: Health Behavior/Discharge Planning: Goal: Ability to manage health-related needs will improve Outcome: Progressing   Problem: Activity: Goal: Risk for activity intolerance will decrease Outcome: Progressing   Problem: Elimination: Goal: Will not experience complications related to bowel motility Outcome: Progressing   Problem: Pain Managment: Goal: General experience of comfort will improve Outcome: Progressing   

## 2020-04-05 NOTE — Evaluation (Signed)
Physical Therapy Evaluation Patient Details Name: Dorothy White MRN: [id number] DOB: September 06, 1950 Today's Date: 04/05/2020   History of Present Illness  Pt is a 69 y.o. F with significant PMH of TIA, mild CAD, NICM, head injury following a fall, left hip arthroplasty, R humerus fx s/p ORIF, who presents after a fall with a left proximal olecranon and humerus fracture s/p ORIF.   Clinical Impression  Prior to admission, pt lives with two friends, is independent with ADL's, uses cane vs walker intermittently for ambulation, and has history of frequent falls. On PT evaluation, pt presents with fair pain control (premedicated prior to session). Ambulating 100 feet with a cane and negotiated 2 steps at a min guard assist level. Education provided regarding precautions, digit ROM, UE elevation, use of cane for all mobility. Pt presents as a high fall risk based on history of falls and decreased gait speed. Recommending HHPT at discharge.     Follow Up Recommendations Home health PT;Supervision/Assistance - 24 hour (HHOT, [place] aide)    Equipment Recommendations  None recommended by PT (has needed DME)   Recommendations for Other Services       Precautions / Restrictions Precautions Precautions: Fall Required Braces or Orthoses: Sling Restrictions Weight Bearing Restrictions: Yes LUE Weight Bearing: Non weight bearing      Mobility  Bed Mobility Overal bed mobility: Needs Assistance Bed Mobility: Supine to Sit     Supine to sit: Supervision     General bed mobility comments: Cues for initiation, supervision for safety  Transfers Overall transfer level: Needs assistance Equipment used: Straight cane Transfers: Sit to/from Stand Sit to Stand: Min guard         General transfer comment: Min guard to rise from edge of bed  Ambulation/Gait Ambulation/Gait assistance: Min guard Gait Distance (Feet): 100 Feet Assistive device: Straight cane Gait Pattern/deviations: Step-through  pattern;Decreased stride length Gait velocity: decreased   General Gait Details: Slow and steady pace, no overt LOB, using cane appropriately. Min guard for safety  Stairs Stairs: Yes Stairs assistance: Min guard Stair Management: No rails;With cane Number of Stairs: 2 General stair comments: Step by step pattern, min cues for cane technique, min guard for safety.  Wheelchair Mobility    Modified Rankin (Stroke Patients Only)       Balance Overall balance assessment: Needs assistance Sitting-balance support: Feet supported Sitting balance-Leahy Scale: Good     Standing balance support: Single extremity supported Standing balance-Leahy Scale: Poor Standing balance comment: reliant on single UE support                             Pertinent Vitals/Pain Pain Assessment: Faces Faces Pain Scale: Hurts even more Pain Location: L elbow Pain Descriptors / Indicators: Grimacing;Operative site guarding Pain Intervention(s): Limited activity within patient's tolerance;Monitored during session;Premedicated before session;Repositioned    Home Living Family/patient expects to be discharged to:: Private residence Living Arrangements: Non-relatives/Friends Available Help at Discharge: Friend(s) Type of Home: House Home Access: Level entry     Home Layout: Two level Home Equipment: Environmental consultant - 2 wheels;Cane - single point;Shower seat      Prior Function Level of Independence: Independent with assistive device(s)         Comments: Uses cane vs walker intermittently, history of frequent falls     Hand Dominance        Extremity/Trunk Assessment   Upper Extremity Assessment Upper Extremity Assessment: Defer to OT evaluation  Lower Extremity Assessment Lower Extremity Assessment: Overall WFL for tasks assessed    Cervical / Trunk Assessment Cervical / Trunk Assessment: Normal  Communication   Communication: No difficulties  Cognition Arousal/Alertness:  Awake/alert Behavior During Therapy: WFL for tasks assessed/performed Overall Cognitive Status: History of cognitive impairments - at baseline                                 General Comments: 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 Pt appropriate during evaluation, oriented, aware of deficits. Reports STM deficits and "not being the same," following injury.      General Comments      Exercises     Assessment/Plan    PT Assessment Patient needs continued PT services  PT Problem List Decreased strength;Decreased range of motion;Decreased activity tolerance;Decreased balance;Decreased mobility;Decreased cognition;Decreased safety awareness;Impaired sensation;Pain       PT Treatment Interventions DME instruction;Gait training;Stair training;Functional mobility training;Therapeutic activities;Therapeutic exercise;Balance training;Patient/family education    PT Goals (Current goals can be found in the Care Plan section)  Acute Rehab PT Goals Patient Stated Goal: less falls PT Goal Formulation: With patient Time For Goal Achievement: 04/19/20 Potential to Achieve Goals: Good    Frequency Min 5X/week   Barriers to discharge        Co-evaluation               AM-PAC PT "6 Clicks" Mobility  Outcome Measure Help needed turning from your back to your side while in a flat bed without using bedrails?: None Help needed moving from lying on your back to sitting on the side of a flat bed without using bedrails?: None Help needed moving to and from a bed to a chair (including a wheelchair)?: A Little Help needed standing up from a chair using your arms (e.g., wheelchair or bedside chair)?: A Little Help needed to walk in hospital room?: A Little Help needed climbing 3-5 steps with a railing? : A Little 6 Click Score: 20    End of Session Equipment Utilized During Treatment: Gait belt;Other (comment) (sling) Activity Tolerance: Patient tolerated  treatment well Patient left: in chair;with call bell/phone within reach;with chair alarm set Nurse Communication: Mobility status PT Visit Diagnosis: Unsteadiness on feet (R26.81);History of falling (Z91.81);Pain Pain - Right/Left: Left Pain - part of body: Arm    Time: 5093-2671 PT Time Calculation (min) (ACUTE ONLY): 35 min   Charges:   PT Evaluation $PT Eval Moderate Complexity: 1 Mod PT Treatments $Gait Training: 8-22 mins          Wyona Almas, PT, DPT Acute Rehabilitation Services Pager [phone] Office [phone]   Deno Etienne 04/05/2020, 9:29 AM

## 2020-04-05 NOTE — Discharge Summary (Signed)
Physician Discharge Summary  Patient ID: Dorothy White MRN: [id number] DOB/AGE: 69-18-1952 69 y.o.  Admit date: 04/04/2020 Discharge date:  04/05/20  Admission Diagnoses: Left proximal ulna fracture Left shoulder proximal humerus fracture Past Medical History:  Diagnosis Date   Anemia    ?   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 25% mLAD, otherwise no sig disease 01/2016 cath.   NICM (nonischemic cardiomyopathy) ([place])    a. EF 40-45% by echo 08/6376 at time of complicated migraine/neuro sx, 55-65% at time of cath 01/2016   Stroke [facility]) 06/2013   "mini" stroke , ?possibly hemaplegic migraine per pt    Discharge Diagnoses:  Active Problems:   Closed comminuted fracture of proximal ulna, left, initial encounter   Surgeries: Procedure(s): OPEN REDUCTION INTERNAL FIXATION (ORIF) ULNAR FRACTURE OPEN REDUCTION INTERNAL FIXATION (ORIF) PROXIMAL HUMERUS FRACTURE on 04/04/2020    Consultants:   Discharged Condition: Improved  Hospital Course: Dorothy White is an 69 y.o. female who was admitted 04/04/2020 with a chief complaint of No chief complaint on file. , and found to have a diagnosis of Left proximal ulna fracture Left shoulder proximal humerus fracture.  They were brought to the operating room on 04/04/2020 and underwent Procedure(s): OPEN REDUCTION INTERNAL FIXATION (ORIF) ULNAR FRACTURE OPEN REDUCTION INTERNAL FIXATION (ORIF) PROXIMAL HUMERUS FRACTURE.    They were  given perioperative antibiotics:  Anti-infectives (From admission, onward)   Start     Dose/Rate Route Frequency Ordered Stop   04/04/20 1200  vancomycin (VANCOCIN) IVPB 1000 mg/200 mL premix        1,000 mg 200 mL/hr over 60 Minutes Intravenous On call to O.R. 04/04/20 1153 04/04/20 1341    .  They were given sequential compression devices, early ambulation, and Other (comment) for DVT prophylaxis.  Recent vital signs:  Patient Vitals for the past 24 hrs:  BP Temp Temp src Pulse Resp SpO2  04/05/20 1246 (!) 123/56 98 F (36.7 C) Oral 73 16 100 %  04/05/20 0845 109/63 98.9 F (37.2 C) Oral 79 18 98 %  04/05/20 0451 (!) 100/55 98 F (36.7 C) Oral 77 16 96 %  04/05/20 0047 118/66 98.1 F (36.7 C) Oral 78 14 98 %  04/04/20 2004 110/60 98.5 F (36.9 C) Oral 84 17 94 %  04/04/20 1945 115/62 97.9 F (36.6 C) -- 88 11 96 %  04/04/20 1930 131/70 -- -- 88 13 97 %  04/04/20 1915 120/68 -- -- 94 16 95 %  04/04/20 1900 135/66 -- -- 100 19 94 %  04/04/20 1845 131/79 97.9 F (36.6 C) -- 91 20 100 %  04/04/20 1400 129/66 -- -- 65 11 100 %  .  Recent laboratory studies: DG MINI C-ARM IMAGE ONLY  Result Date: 04/04/2020 There is no interpretation for this exam.  This order is for images obtained during a surgical procedure.  Please  See "Surgeries" Tab for more information regarding the procedure.    Discharge Medications:   Allergies as of 04/05/2020      Reactions   Opana [oxymorphone Hcl] Other (See Comments)   hallucinations   Penicillins Hives   Has patient had a PCN reaction causing immediate rash, facial/tongue/throat swelling, SOB or lightheadedness with hypotension: Unknown Has patient had a PCN reaction causing severe rash involving mucus membranes or skin necrosis: No Has patient had a PCN reaction that required hospitalization No Has patient had a PCN reaction occurring within the last 10 years: No If all of the above answers are "NO", then may proceed with Cephalosporin use.       Medication List    TAKE these medications   clonazePAM 1 MG tablet Commonly known as: KLONOPIN Take 0.5 mg by mouth at bedtime as needed for anxiety.   [employer] D3 25 MCG (1000 UT) capsule Generic drug: Cholecalciferol Take 1,000 Units by mouth daily.   feeding supplement (ENSURE ENLIVE) Liqd Take 237 mLs by mouth 3 (three) times daily between meals.   FLUoxetine 40 MG capsule Commonly known as: PROZAC Take 80 mg by mouth daily.   gabapentin 100 MG capsule Commonly known as: NEURONTIN Take 300 mg by mouth daily.   HYDROmorphone 4 MG tablet Commonly known as: DILAUDID Take by mouth every 4 (four) hours as needed for severe pain.   LUTEIN PO Take 1 capsule by mouth daily.   Magnesium 500 MG Tabs Take 500 mg by mouth daily.   mometasone 50 MCG/ACT nasal spray Commonly known as: NASONEX Place 2 sprays into the nose daily.   multivitamin with minerals Tabs tablet Take 1 tablet by mouth daily.   omeprazole 20 MG capsule Commonly known as: PRILOSEC Take 20 mg by mouth daily.   ondansetron 8 MG tablet Commonly known as: ZOFRAN Take 1 tablet (8 mg total) by mouth every 8 (eight) hours as needed for nausea or vomiting.   oxyCODONE-acetaminophen 10-325 MG tablet Commonly known as: PERCOCET Take 1 tablet by mouth every 6 (six) hours as needed for pain. Patient is out of medication What changed: Another medication with the same name was removed. Continue taking this medication, and follow the directions you see here.   SUMAtriptan 100 MG tablet Commonly known as: IMITREX Take 50-100 mg by mouth every 2 (two) hours as needed for migraine or headache.   tiZANidine 4 MG tablet Commonly known as: ZANAFLEX Take 4 mg by mouth 3 (three) times daily as needed for muscle spasms.   topiramate 100 MG tablet Commonly known as: TOPAMAX Take 1 tablet (100 mg total) by mouth at bedtime.   trazodone 300 MG tablet Commonly known as: DESYREL Take 1 tablet (300 mg total) by mouth  at bedtime as needed for sleep. What changed:   when to take this  additional instructions   vitamin B-12 1000 MCG tablet Commonly known as: CYANOCOBALAMIN Take 1 tablet (1,000 mcg total) by mouth daily.   vitamin E 45 MG (100 UNITS) capsule Take 100 Units by mouth daily.       Diagnostic Studies: DG Elbow Complete Left  Result Date: 03/29/2020 CLINICAL DATA:  Elbow fracture, fall down stairs at home. EXAM: LEFT ELBOW - COMPLETE 3+ VIEW COMPARISON:  Forearm evaluation of 03/29/2020 FINDINGS: Displaced olecranon fracture with mild comminution, multipart fragmentation of the proximal fracture fragment, approximately 6 mm retraction of the proximal fracture fragments with soft tissue swelling over the dorsum of the elbow, associated with posterior fat  pad. Fracture is intra-articular. No visible coronoid process fracture. Radiocapitellar relationships are maintained. No sign of radial head fracture. No additional fracture IMPRESSION: Displaced olecranon fracture with soft tissue swelling over the dorsum of the comminuted proximal fracture fragment that is retracted/displaced approximately 6 mm. Orthopedic consultation is suggested. Electronically Signed   By: Zetta Bills M.D.   On: 03/29/2020 08:23   DG Forearm Left  Result Date: 03/29/2020 CLINICAL DATA:  Patient fell down stairs.  Left arm pain. EXAM: LEFT FOREARM - 2 VIEW COMPARISON:  None. FINDINGS: Two-view exam shows diffuse demineralization. Proximal ulnar fracture noted at the level of the elbow, apparently isolating the olecranon as a free fragment. No evidence for an associated radius fracture. IMPRESSION: Proximal ulnar fracture apparently isolating the olecranon as a free fragment. Dedicated elbow films recommended. Electronically Signed   By: Misty Stanley M.D.   On: 03/29/2020 07:10   DG Wrist Complete Left  Result Date: 03/26/2020 CLINICAL DATA:  [place] 03/24/2020, pain EXAM: LEFT HAND - COMPLETE 3+ VIEW; LEFT WRIST - COMPLETE  3+ VIEW COMPARISON:  02/13/2018 FINDINGS: Left hand: Frontal, oblique, and lateral views are obtained. There is a mildly comminuted intra-articular fracture involving the distal aspect of the fifth metacarpal. No dislocation. Soft tissue swelling within the dorsal ulnar aspect of the left hand. There is diffuse osteoarthritis most pronounced throughout the distal interphalangeal joints. Left wrist: Frontal, oblique, lateral, and ulnar deviated views are obtained. Comminuted fracture through the distal margin of the fifth metacarpal is again identified. There are no other acute bony abnormalities. Alignment is anatomic. Mild osteoarthritis at the first carpometacarpal joint. IMPRESSION: 1. Mildly comminuted intra-articular fracture distal margin fifth metacarpal. 2. Osteoarthritis. Electronically Signed   By: Randa Ngo M.D.   On: 03/26/2020 23:14   DG Shoulder Left  Result Date: 03/29/2020 CLINICAL DATA:  Fall.  Left shoulder pain. EXAM: LEFT SHOULDER - 2+ VIEW COMPARISON:  None. FINDINGS: Two view exam of the left shoulder shows a transverse fracture of the proximal humerus, in the region of the surgical neck. Lateral film documents apex anterior angulation. Bones are diffusely demineralized. IMPRESSION: Transverse fracture of the proximal humerus in the region of the surgical neck. Electronically Signed   By: Misty Stanley M.D.   On: 03/29/2020 07:08   DG Humerus Left  Result Date: 03/29/2020 CLINICAL DATA:  Fall EXAM: LEFT HUMERUS - 2+ VIEW COMPARISON:  None. FINDINGS: Anteromedially displaced proximal humeral shaft fracture. The glenohumeral joint and elbow remain approximated. Diffuse soft tissue swelling. Comminuted olecranon fracture is better demonstrated on concurrent forearm radiographs. IMPRESSION: Anteromedially displaced proximal humeral shaft fracture. Please see concurrent forearm radiographs for better characterisation of comminuted olecranon fracture. Electronically Signed   By: Primitivo Gauze M.D.   On: 03/29/2020 07:09   DG Hand Complete Left  Result Date: 03/26/2020 CLINICAL DATA:  [place] 03/24/2020, pain EXAM: LEFT HAND - COMPLETE 3+ VIEW; LEFT WRIST - COMPLETE 3+ VIEW COMPARISON:  02/13/2018 FINDINGS: Left hand: Frontal, oblique, and lateral views are obtained. There is a mildly comminuted intra-articular fracture involving the distal aspect of the fifth metacarpal. No dislocation. Soft tissue swelling within the dorsal ulnar aspect of the left hand. There is diffuse osteoarthritis most pronounced throughout the distal interphalangeal joints. Left wrist: Frontal, oblique, lateral, and ulnar deviated views are obtained. Comminuted fracture through the distal margin of the fifth metacarpal is again identified. There are no other acute bony abnormalities. Alignment is anatomic. Mild osteoarthritis at the first carpometacarpal joint. IMPRESSION: 1. Mildly  comminuted intra-articular fracture distal margin fifth metacarpal. 2. Osteoarthritis. Electronically Signed   By: Randa Ngo M.D.   On: 03/26/2020 23:14   DG MINI C-ARM IMAGE ONLY  Result Date: 04/04/2020 There is no interpretation for this exam.  This order is for images obtained during a surgical procedure.  Please See "Surgeries" Tab for more information regarding the procedure.    They benefited maximally from their hospital stay and there were no complications.     Disposition: Discharge disposition: 01-Home or Self Care          Signed: Brynda Peon 04/05/2020, 1:55 PM

## 2020-04-05 NOTE — Progress Notes (Signed)
Received a call from PA that pt has Pain management MD. So there's no need for script. Discussed with pt and friend with no complaints voiced. Pt verbalized understanding.D/C as ordered.with HH to follow.

## 2020-04-05 NOTE — Anesthesia Postprocedure Evaluation (Signed)
Anesthesia Post Note  Patient: [name]  Procedure(s) Performed: OPEN REDUCTION INTERNAL FIXATION (ORIF) ULNAR FRACTURE (Left ) OPEN REDUCTION INTERNAL FIXATION (ORIF) PROXIMAL HUMERUS FRACTURE (Left )     Patient location during evaluation: PACU Anesthesia Type: Regional and General Level of consciousness: awake and alert Pain management: pain level controlled Vital Signs Assessment: post-procedure vital signs reviewed and stable Respiratory status: spontaneous breathing, nonlabored ventilation, respiratory function stable and patient connected to nasal cannula oxygen Cardiovascular status: blood pressure returned to baseline and stable Postop Assessment: no apparent nausea or vomiting Anesthetic complications: no   No complications documented.  Last Vitals:  Vitals:   04/05/20 0047 04/05/20 0451  BP: 118/66 (!) 100/55  Pulse: 78 77  Resp: 14 16  Temp: 36.7 C 36.7 C  SpO2: 98% 96%    Last Pain:  Vitals:   04/05/20 0451  TempSrc: Oral  PainSc:                  [place] S

## 2020-04-05 NOTE — Evaluation (Addendum)
Occupational Therapy Evaluation Patient Details Name: Dorothy White MRN: [id number] DOB: 1950-09-30 Today's Date: 04/05/2020    History of Present Illness Pt is a 69 y.o. F with significant PMH of TIA, mild CAD, NICM, head injury following a fall, left hip arthroplasty, R humerus fx s/p ORIF, who presents after a fall with a left proximal olecranon and humerus fracture s/p ORIF.    Clinical Impression   PTA pt living with two friends (one whom she reports is independent and "in and out" and other she reports as "disabled"). At baseline pt reports being independent, but does have a recurring history of falls. At time of eval, pt presents with ability to complete sit <> stands at min guard level. Pt is currently completing BADL at mod A level to manage sling and follow LUE precautions. 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 She requires increased time for problem solving, safety, and is noted to have poor emotional regulation. LUE hand is very edematous. Performed edema massage to digits and dorsum of hand. Issued pt squeezeball and instructed on exercise for ROM and edema management. Pt is reporting numbness most prominently in median nerve distribution this date. Given current status, recommend SNF vs HHOT to support safety, BADL engagement, independent PLOF. OT will continue to follow per POC listed below.     Follow Up Recommendations  SNF;Home health OT;Supervision/Assistance - 24 hour (pending progress)    Equipment Recommendations  3 in 1 bedside commode    Recommendations for Other Services       Precautions / Restrictions Precautions Precautions: Fall Required Braces or Orthoses: Sling Restrictions Weight Bearing Restrictions: Yes LUE Weight Bearing: Non weight bearing      Mobility Bed Mobility Overal bed mobility: Needs Assistance Bed Mobility: Supine to Sit     Supine to sit: Supervision     General bed mobility comments: up in recliner,  returned to recliner  Transfers Overall transfer level: Needs assistance Equipment used: None Transfers: Sit to/from Stand Sit to Stand: Min guard         General transfer comment: Min guard to rise from edge of bed    Balance Overall balance assessment: Needs assistance Sitting-balance support: Feet supported Sitting balance-Leahy Scale: Good     Standing balance support: Single extremity supported Standing balance-Leahy Scale: Fair Standing balance comment: able to static stand at sink without UE support to engage in sink bath                           ADL either performed or assessed with clinical judgement   ADL Overall ADL's : Needs assistance/impaired Eating/Feeding: Set up;Sitting   Grooming: Minimal assistance;Sitting   Upper Body Bathing: Moderate assistance;Sitting Upper Body Bathing Details (indicate cue type and reason): to reach back and R side; able to wash LUE axillary using "see saw method" Lower Body Bathing: Minimal assistance;Sit to/from stand;Sitting/lateral leans   Upper Body Dressing : Moderate assistance;Sitting Upper Body Dressing Details (indicate cue type and reason): for sling management and to use appropriate compensatory techniques Lower Body Dressing: Minimal assistance;Sit to/from stand;Sitting/lateral leans   Toilet Transfer: Min guard;Ambulation;Regular Museum/gallery exhibitions officer and Hygiene: Min guard;Sitting/lateral lean;Sit to/from stand       Functional mobility during ADLs: Min guard;Cueing for safety General ADL Comments: cognitive deficits and poor emotional regulation (suspect to be baseline)     Vision Patient Visual Report: No change from baseline  Perception     Praxis      Pertinent Vitals/Pain Pain Assessment: Faces Faces Pain Scale: Hurts even more Pain Location: L elbow Pain Descriptors / Indicators: Grimacing;Operative site guarding Pain Intervention(s): Limited activity  within patient's tolerance;Monitored during session;Repositioned     Hand Dominance Right   Extremity/Trunk Assessment Upper Extremity Assessment Upper Extremity Assessment: LUE deficits/detail LUE Deficits / Details: edematous hand; reports numbness throughout median nerve distribution- possibly more involved- difficult to fully assess due to splint. Limited ability to perform shoulder AROM LUE: Unable to fully assess due to immobilization LUE Sensation: decreased light touch LUE Coordination: decreased fine motor;decreased gross motor   Lower Extremity Assessment Lower Extremity Assessment: Defer to PT evaluation   Cervical / Trunk Assessment Cervical / Trunk Assessment: Normal   Communication Communication Communication: No difficulties   Cognition Arousal/Alertness: Awake/alert Behavior During Therapy: WFL for tasks assessed/performed;Anxious Overall Cognitive Status: History of cognitive impairments - at baseline Area of Impairment: Memory;Problem solving;Safety/judgement                     Memory: Decreased short-term memory;Decreased recall of precautions   Safety/Judgement: Decreased awareness of safety;Decreased awareness of deficits   Problem Solving: Slow processing;Requires verbal cues General Comments: Pt has hx of TBI following a fall 3 years go. Pt is easily anxious with poor emotional regulation (easily frustrated with OT commands). Needs increased time and cueing to process basic BADLs in session. Poor STM and awareness of precautions with new UE injury   General Comments       Exercises Other Exercises Other Exercises: Hand: digit composite flexion/extension x10; squeeze ball for edema management and coordination   Shoulder Instructions      Home Living Family/patient expects to be discharged to:: Private residence Living Arrangements: Non-relatives/Friends Available Help at Discharge: Friend(s) Type of Home: House Home Access: Level entry      Home Layout: Two level Alternate Level Stairs-Number of Steps: 2 to get into bedroom Alternate Level Stairs-Rails: Right Bathroom Shower/Tub: Walk-in shower         Home Equipment: Environmental consultant - 2 wheels;Cane - single point;Shower seat          Prior Functioning/Environment Level of Independence: Independent with assistive device(s)        Comments: Uses cane vs walker intermittently, history of frequent falls        OT Problem List: Decreased strength;Decreased knowledge of use of DME or AE;Increased edema;Decreased range of motion;Decreased coordination;Decreased knowledge of precautions;Decreased activity tolerance;Decreased cognition;Impaired UE functional use;Impaired balance (sitting and/or standing);Decreased safety awareness;Pain      OT Treatment/Interventions: Self-care/ADL training;Therapeutic exercise;Patient/family education;Balance training;Therapeutic activities;DME and/or AE instruction;Cognitive remediation/compensation    OT Goals(Current goals can be found in the care plan section) Acute Rehab OT Goals Patient Stated Goal: less falls OT Goal Formulation: With patient Time For Goal Achievement: 04/19/20 Potential to Achieve Goals: Good  OT Frequency: Min 2X/week   Barriers to D/C:            Co-evaluation              AM-PAC OT "6 Clicks" Daily Activity     Outcome Measure Help from another person eating meals?: A Little Help from another person taking care of personal grooming?: A Little Help from another person toileting, which includes using toliet, bedpan, or urinal?: A Little Help from another person bathing (including washing, rinsing, drying)?: A Lot Help from another person to put on and taking off regular upper body clothing?:  A Lot Help from another person to put on and taking off regular lower body clothing?: A Little 6 Click Score: 16   End of Session Nurse Communication: Mobility status;Precautions;Weight bearing  status  Activity Tolerance: Patient tolerated treatment well Patient left: in chair;with call bell/phone within reach;with chair alarm set  OT Visit Diagnosis: Unsteadiness on feet (R26.81);Other abnormalities of gait and mobility (R26.89);Muscle weakness (generalized) (M62.81);Pain Pain - Right/Left: Left Pain - part of body: Arm                Time: 0925-1000 OT Time Calculation (min): 35 min Charges:  OT General Charges $OT Visit: 1 Visit OT Evaluation $OT Eval Moderate Complexity: 1 Mod OT Treatments $Self Care/Home Management : 8-22 mins  Zenovia Jarred, MSOT, OTR/L Acute Rehabilitation Services [facility] Office Number: [phone] Pager: [phone]  Zenovia Jarred 04/05/2020, 10:49 AM

## 2020-04-05 NOTE — Progress Notes (Addendum)
Pt received discharge instruction and does not have any concerns or questions at this time. Pt encouraged to make follow up appointment with MD per discharge papers. Pt currently does not have prescription for her pain medication and there is no orders for [facility] PT/OT. [facility] was called and informed about missing orders. Case manager is currently reviewing patient's chart. Awaiting a call back from Dr. Angus Palms office to complete the discharge. Pt stated that she does not have any Percocet left at home and she does not have any refills left from previous prescription.

## 2020-04-06 ENCOUNTER — Encounter (HOSPITAL_COMMUNITY): Payer: Self-pay | Admitting: Orthopedic Surgery

## 2020-04-06 NOTE — Progress Notes (Signed)
Assessment/Plan:   1.  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  It is somewhat difficult to tell today given limited exam on the L side due to recent surgery and arm in sling.  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  We talked about nature, etiology and pathophysiology. We talked about how the symptoms, course and prognosis differ from Parkinson's Disease.  We talked about the risks, particularly for falls and aspiration.  She was given patient information on this disease.  -I will refer the patient for PT  -We will initiate carbidopa/levodopa 25/100 and slowly work up to 1 tablet three times a day at 7 AM/11 AM/4 PM.  This likely won't be enough medication but this is what we will start with for now and we will see how she does.  She was somewhat reluctant to take medication, but ultimately decided to try it.  She will let me know if she has any lightheadedness/near syncope.  -We will repeat her MRI of the brain.  She does have hyperreflexia and ataxia.  I did tell her that we will certainly see atrophy and small vessel disease.  Want to make sure we are not missing something else.  -Discussed with the patient that she needs to use a walker at all times.  She is not using her right now because her arm is in a sling, but I told her that there are walkers that can accommodate this.  -Discussed with her that there is an atypical support group and I think she would derive benefit from this group.  2.  Chronic back pain  -on percocet chronically by Dr. Nelva Bush.  -She is currently on Dilaudid because of her fracture.  Regardless, we will need to watch these medications at the course of time as they can throw off balance as well.  3.  Discussed the  importance of regular follow-up and compliance with the patient.  She was discharged from [facility] neurology for no-shows.  While she certainly had a good reason not to show for her first new patient visit here (was in ER with fall), she definitely could have canceled the second one she no showed as she had a scheduled surgery that day.  She needs regular neuro care and I want to make sure that she gets that.    Subjective:   Dorothy White was seen today in the movement disorders clinic for neurologic consultation at the request of Maurice Small, MD.  The consultation is for the evaluation of possible PD.  Pt previously under the care of Dr. Jannifer Franklin and Dr. Krista Blue.  Those records are reviewed.  Pt is a 69 y/o female with h/o of hemiplegic migraine.  She did fairly well with Topamax.  In his December, 2018 the patient came to Dr. Jannifer Franklin after she fell and broke her hip.  She apparently had a syncopal episode associated with a fall.  She had a similar episode 2 years prior to that where had a syncopal episode, fell and fractured her right shoulder.  On examination, Dr. Jannifer Franklin noted that patient's gait was wide-based and decreased arm swing on the left.  He noted that he was going to watch out for the development of Parkinson's, but did not think  she had it.  In 2019, she began to complain about multiple falls and difficulty ambulating.  She saw Dr. Jannifer Franklin because of that.  He noted that the patient had a "progressive gait disorder."  He stated that she did not have "true features of Parkinson's disease today."  It appears that the patient no showed for a few appointments following that and was discharged from their clinic.  Records from the ER indicates she has been in the emergency room several times with falls over the years.  In fact, she had an appointment here a few weeks ago but could not come that day because she was in the emergency room after a fall in which she fractured her ulna and humerus.  She had a  surgery on that on September 8.  She was in the emergency room in 2019 with her brother complaining about worsening cognitive trouble.   Specific Symptoms:  Tremor: Yes.  , occasionally, can be either hand but not prominent feature Family hx of similar:  Yes.   - father had Parkinsons Disease but "I'm not sure he had Parkinsons Disease."  He died at 69 y/o  Voice: weaker Sleep: trouble staying asleep intermittently - mostly right now because of recent shoulder sleep  Vivid Dreams:  No.  Acting out dreams:  No. Wet Pillows: rare Postural symptoms:  Yes.    Falls?  Yes.  Multiple, resulting in history of fractures and history of subdural hematoma in 2019, for which she has seen neurosurgery.  She states that she just "goes down."  Can go any direction with a fall.  Several falls associated with syncopal episodes and multiple fractures. Bradykinesia symptoms: shuffling gait and difficulty regaining balance Loss of smell:  Yes.   Loss of taste:  Yes.   Urinary Incontinence:  Yes.    Difficulty Swallowing:  No. Handwriting, micrographia: No. but it is sloppy Trouble with ADL's:  No.  Trouble buttoning clothing: No. Depression:  Yes.   Memory changes:  Yes.   Hallucinations:  No.  visual distortions: Yes.   N/V:  No. Lightheaded:  Yes.    Syncope: Yes.   - been a long time but has had multiple episodes with fx in the past Diplopia:  Yes.    - in thepast had this after SDH but did get better but not completely Dyskinesia:  No. Prior exposure to reglan/antipsychotics: No.    Last CT brain was March, 2020.  There was atrophy, but it was unremarkable otherwise.  PREVIOUS MEDICATIONS: Topamax  ALLERGIES:   Allergies  Allergen Reactions  . Opana [Oxymorphone Hcl] Other (See Comments)    hallucinations  . Penicillins Hives    Has patient had a PCN reaction causing immediate rash, facial/tongue/throat swelling, SOB or lightheadedness with hypotension: Unknown Has patient had a PCN  reaction causing severe rash involving mucus membranes or skin necrosis: No Has patient had a PCN reaction that required hospitalization No Has patient had a PCN reaction occurring within the last 10 years: No If all of the above answers are "NO", then may proceed with Cephalosporin use.     CURRENT MEDICATIONS:  Current Outpatient Medications  Medication Instructions  . clonazePAM (KLONOPIN) 0.5 mg, Oral, At bedtime PRN  . CV[employer] D3 1,000 Units, Oral, Daily  . feeding supplement, ENSURE ENLIVE, (ENSURE ENLIVE) LIQD 237 mLs, Oral, 3 times daily between meals  . FLUoxetine (PROZAC) 80 mg, Oral, Daily  . gabapentin (NEURONTIN) 300 mg, Oral, Daily  . HYDROmorphone (DILAUDID) 4  MG tablet Oral, Every 4 hours PRN  . LUTEIN PO 1 capsule, Oral, Daily  . Magnesium 500 mg, Oral, Daily  . mometasone (NASONEX) 50 MCG/ACT nasal spray 2 sprays, Nasal, Daily  . Multiple Vitamin (MULTIVITAMIN WITH MINERALS) TABS tablet 1 tablet, Oral, Daily  . omeprazole (PRILOSEC) 20 mg, Oral, Daily  . ondansetron (ZOFRAN) 8 mg, Oral, Every 8 hours PRN  . oxyCODONE-acetaminophen (PERCOCET) 10-325 MG tablet 1 tablet, Oral, Every 6 hours PRN, Patient is out of medication  . SUMAtriptan (IMITREX) 50-100 mg, Oral, Every 2 hours PRN  . tiZANidine (ZANAFLEX) 4 mg, Oral, 3 times daily PRN  . topiramate (TOPAMAX) 100 mg, Oral, Daily at bedtime  . trazodone (DESYREL) 300 mg, Oral, At bedtime PRN  . vitamin B-12 (CYANOCOBALAMIN) 1,000 mcg, Oral, Daily  . vitamin E 100 Units, Oral, Daily    Objective:   VITALS:   Vitals:   04/11/20 1025  BP: (!) 147/79  Pulse: 81  SpO2: 94%  Weight: 125 lb (56.7 kg)  Height: 5\' 5"  (1.651 m)    GEN:  The patient appears stated age and is in NAD. HEENT:  Normocephalic, atraumatic.  The mucous membranes are moist. The superficial temporal arteries are without ropiness or tenderness. CV:  RRR Lungs:  CTAB Neck/HEME:  There are no carotid bruits bilaterally.  Neurological  examination:  Orientation: The patient is alert and oriented x3.  Cranial nerves: There is good facial symmetry. She has facial hypomimia.  She has some esotropia. The visual fields are full to confrontational testing. The speech is fluent and clear. Soft palate rises symmetrically and there is no tongue deviation. Hearing is intact to conversational tone. Sensation: Sensation is intact to light touch  throughout (facial, trunk, extremities). Vibration is intact at the bilateral big toe. There is no extinction with double simultaneous stimulation. There is no sensory dermatomal level identified. Motor: Strength is 5/5 in the R upper and lower extremities.   Strength is at least 3/5 in the LUE (in a sling).  Shoulder shrug is equal and symmetric.  There is no pronator drift. Deep tendon reflexes: Deep tendon reflexes are 3/4 at the bilateral biceps, triceps, brachioradialis, patella and achilles. Plantar responses are downgoing bilaterally.  Movement examination: Tone: There is normal tone in the RUE/RLE.  Unable to test in the LUE due to wearing sling.  Tone increased in the LLE, mild Abnormal movements: rare tremor at rest in the L thumb that doesn't increase with distraction Coordination:  There is no decremation with RAM's on the right or L leg.  Unable to test in the L arm due to in sling Gait and Station: The patient has no difficulty arising out of a deep-seated chair without the use of the hands. The patient's stride length is wide based, short stepped, ataxic.  I have reviewed and interpreted the following labs independently   Chemistry      Component Value Date/Time   NA 135 04/04/2020 1212   NA 147 04/13/2017 0000   K 3.9 04/04/2020 1212   CL 98 04/04/2020 1212   CO2 25 04/04/2020 1212   BUN 15 04/04/2020 1212   BUN 11 04/13/2017 0000   CREATININE 0.70 04/04/2020 1212   CREATININE 0.95 02/15/2016 1536   GLU 93 04/13/2017 0000      Component Value Date/Time   CALCIUM 9.2  04/04/2020 1212   CALCIUM 9.4 01/13/2007 2356   ALKPHOS 109 04/04/2020 1212   AST 13 (L) 04/04/2020 1212   ALT 10  04/04/2020 1212   BILITOT 1.0 04/04/2020 1212      Lab Results  Component Value Date   TSH 0.720 12/17/2017   Lab Results  Component Value Date   WBC 6.3 04/04/2020   HGB 12.1 04/04/2020   HCT 40.4 04/04/2020   MCV 92.0 04/04/2020   PLT 252 04/04/2020   Patient had lab work on March 26, 2020.  White blood cells 5.8, hemoglobin 13.4, hematocrit 39.8 and platelets 268.  Sedimentation rate 17.  Sodium was 140, potassium 4.5, chloride 106, CO2 28, BUN 14, creatinine 0.90, AST 17, ALT 12, TSH 0.66.  Last B12 was in May, 2021 and was greater than 1500.  Total time spent on today's visit was 70 minutes, including both face-to-face time and nonface-to-face time.  Time included that spent on review of records (prior notes available to me/labs/imaging if pertinent), discussing treatment and goals, answering patient's questions and coordinating care.  Cc:  Maurice Small, MD

## 2020-04-07 ENCOUNTER — Other Ambulatory Visit: Payer: Medicare Other

## 2020-04-10 ENCOUNTER — Ambulatory Visit
Admission: RE | Admit: 2020-04-10 | Discharge: 2020-04-10 | Disposition: A | Discharge status: Home / Self Care | Payer: Medicare Other | Source: Ambulatory Visit | Attending: Physical Medicine and Rehabilitation | Admitting: Physical Medicine and Rehabilitation

## 2020-04-10 DIAGNOSIS — M542 Cervicalgia: Secondary | ICD-10-CM

## 2020-04-11 ENCOUNTER — Encounter: Payer: Self-pay | Admitting: Neurology

## 2020-04-11 ENCOUNTER — Ambulatory Visit (INDEPENDENT_AMBULATORY_CARE_PROVIDER_SITE_OTHER): Payer: Medicare Other | Admitting: Neurology

## 2020-04-11 ENCOUNTER — Other Ambulatory Visit: Payer: Self-pay

## 2020-04-11 VITALS — BP 147/79 | HR 81 | Ht 65.0 in | Wt 125.0 lb

## 2020-04-11 DIAGNOSIS — G238 Other specified degenerative diseases of basal ganglia: Secondary | ICD-10-CM

## 2020-04-11 DIAGNOSIS — R292 Abnormal reflex: Secondary | ICD-10-CM | POA: Diagnosis not present

## 2020-04-11 DIAGNOSIS — R270 Ataxia, unspecified: Secondary | ICD-10-CM | POA: Diagnosis not present

## 2020-04-11 MED ORDER — CARBIDOPA-LEVODOPA 25-100 MG PO TABS: 1.0000 | ORAL_TABLET | Freq: Three times a day (TID) | ORAL | 1 refills | Status: DC

## 2020-04-11 NOTE — Patient Instructions (Addendum)
1.  Start Carbidopa Levodopa as follows:  Take 1/2 tablet three times daily, at least 30 minutes before meals (approximately 7am/11am/4pm), for one week  Then take 1/2 tablet in the morning, 1/2 tablet in the afternoon, 1 tablet in the evening, at least 30 minutes before meals, for one week  Then take 1/2 tablet in the morning, 1 tablet in the afternoon, 1 tablet in the evening, at least 30 minutes before meals, for one week  Then take 1 tablet three times daily at 7am/11am/4pm, at least 30 minutes before meals   As a reminder, carbidopa/levodopa can be taken at the same time as a carbohydrate, but we like to have you take your pill either 30 minutes before a protein source or 1 hour after as protein can interfere with carbidopa/levodopa absorption.  2.  You have been referred to Neuro Rehab for therapy. They will call you directly to schedule an appointment.  Please call [phone] if you do not hear from them.   3.  A referral to [facility] has been placed for your MRI someone will contact you directly to schedule your appt. They are located at [place]. Please contact them directly by calling [phone] with any questions regarding your referral.  The physicians and staff at [facility] are committed to providing excellent care. You may receive a survey requesting feedback about your experience at our office. We strive to receive "very good" responses to the survey questions. If you feel that your experience would prevent you from giving the office a "very good " response, please contact our office to try to remedy the situation. We may be reached at [phone]. Thank you for taking the time out of your busy day to complete the survey.

## 2020-04-13 ENCOUNTER — Telehealth: Payer: Self-pay | Admitting: Neurology

## 2020-04-13 NOTE — Telephone Encounter (Signed)
Patient called in and left a message. She would like to speak with someone to get home health set up for herself.

## 2020-04-13 NOTE — Telephone Encounter (Signed)
Left message for patient informing her that Dr Tat is out of the office and she will not return to the office until Thursday. And that I will give her a call back once I hear back from Dr Tat.

## 2020-04-17 NOTE — Telephone Encounter (Signed)
We already had referred for PT (was that via home health)?  They will allow for therapies but b/c of insurance, they won't allow for home health for bathing, caregiving, etc

## 2020-04-17 NOTE — Telephone Encounter (Signed)
I left message for patient of the above, or to call back if anything else needed.

## 2020-04-19 DIAGNOSIS — Z4789 Encounter for other orthopedic aftercare: Secondary | ICD-10-CM | POA: Insufficient documentation

## 2020-04-20 NOTE — Op Note (Signed)
NAME: Dorothy White, Dorothy White MEDICAL RECORD [id number] ACCOUNT [id number] DATE OF BIRTH:April 28, 1951 FACILITY: [facility] LOCATION: [facility] PHYSICIAN:STEVEN Orlena Sheldon, MD  OPERATIVE REPORT  DATE OF PROCEDURE:  04/04/2020  PREOPERATIVE DIAGNOSIS:  Left displaced proximal humerus fracture.  POSTOPERATIVE DIAGNOSIS:  Left displaced proximal humerus fracture.  PROCEDURE PERFORMED:  Open reduction internal fixation of left proximal humerus fracture utilizing [employer] ALPS proximal humerus plate.  ATTENDING SURGEON:  Esmond Plants, MD  ASSISTANT:  Iran Planas, MD  ANESTHESIA:  General anesthesia was used.  ESTIMATED BLOOD LOSS:  150 mL.  FLUID REPLACEMENT:  1000 mL crystalloid.  INSTRUMENT COUNTS:  Correct.  COMPLICATIONS:  No complications.  ANTIBIOTICS:  Perioperative antibiotics were given.  INDICATIONS:  The patient is a 69 year old female who suffered a displaced olecranon fracture and a displaced proximal humerus fracture after a fall.  The patient has a significantly displaced proximal humerus fracture with greater than 100% displacement  of the humeral shaft relative to the head.  There was also significant malrotation of the humeral head relative to the shaft.  After consultation with the patient and Dr. Caralyn Guile, it was suggested that the patient would benefit from reduction and  stabilization with an ORIF.  The patient agreed to this.  Informed consent obtained.  DESCRIPTION OF PROCEDURE:  After an adequate level of anesthesia was achieved, the patient was placed in modified beach chair position.  This procedure was done as a separate procedure under the same anesthetic as the olecranon ORIF by Dr. Caralyn Guile.  Once  we had a sterile prep and drape of the left shoulder performed, time-out called, verifying correct patient, correct site and deltopectoral approach utilized, starting at the coracoid process with a 10 blade scalpel, dissection down to the proximal  humerus, dissection down  through subcutaneous tissues using Bovie.  We identified the cephalic vein, took that laterally with the deltoid, pectoralis taken medially.  Conjoined tendon identified and retracted medially.  The fracture was identified and  there was significant malrotation of the humeral head relative to the shaft.  We were able to derotate that with a Cobb elevator through the fracture site and then utilizing retractors, we were able to align the fracture in satisfactory position.  We  placed the ALPS plate by Biomet along the lateral side of the biceps groove at the appropriate height.  We placed a guidepin into the center of the head and verified our height and our anterior, posterior location on multiplanar C-arm that was draped  into the field.  Once we were happy with our head position, we placed several smooth pegs that locked into the plate into the proximal fragment.  We then went ahead and placed a single 3.5 bicortical screw into the plate in the distal fragment.  Once we  were happy with that fracture alignment, we placed the remainder of the locking smooth pegs proximally and also the bicortical screws distally.  We had excellent purchase with our screws and good alignment with our fracture, restoring the alignment.  We  irrigated thoroughly and then at this point, went ahead and closed the deltopectoral interval with 0 Vicryl suture, followed by 2-0 Vicryl for subcutaneous closure and staples for skin.  A sterile bandage was applied.  The patient was transported to  recovery room in stable condition.  VN/NUANCE  D:04/20/2020 T:04/20/2020 JOB:[id number]

## 2020-04-23 ENCOUNTER — Ambulatory Visit: Payer: Medicare Other | Attending: Neurology

## 2020-04-24 ENCOUNTER — Other Ambulatory Visit: Payer: Medicare Other

## 2020-04-25 DIAGNOSIS — M25522 Pain in left elbow: Secondary | ICD-10-CM | POA: Insufficient documentation

## 2020-04-27 ENCOUNTER — Other Ambulatory Visit: Payer: Medicare Other

## 2020-04-27 ENCOUNTER — Telehealth: Payer: Self-pay | Admitting: Neurology

## 2020-04-27 NOTE — Telephone Encounter (Signed)
Patient states she just started Carbidopa Levodopa and she thinks it's making her nauseous and she can't eat anything. Would like to know if Dr Tat can send in something to help with the nausea? Please call.

## 2020-04-27 NOTE — Telephone Encounter (Signed)
Has she tried taking it with food?  If so, she can d/c that version and instead try carbidopa/levodopa 25/100 CR, 1 at 7am/11am/4pm and you can send that in

## 2020-04-27 NOTE — Telephone Encounter (Signed)
Spoke with patient and asked if she was taking the medication with food. She states yes she has tried to take the medication with food but she is still nauseous.   Asked patient if she has tried to eat a big meal wait an hour then take the medication and if is this helps with the nausea. Advised patient to try this over the weekend and contact the office on Monday to let us know how she is doing.   Advised patient that if that does not work then Dr Tat is going to change her medication from short acting to extended release.   Patient voiced understanding.

## 2020-05-11 ENCOUNTER — Other Ambulatory Visit: Payer: Medicare Other

## 2020-05-17 IMAGING — CR DG HIP (WITH OR WITHOUT PELVIS) 2-3V*L*
2 series · 2 of 2 positions shown · non-contrast
Comparison: Left hip radiograph dated 02/19/2018

CLINICAL DATA: 66-year-old female with fall.

EXAM:
LEFT KNEE - COMPLETE 4+ VIEW; DG HIP (WITH OR WITHOUT PELVIS) 2-3V
RIGHT; DG HIP (WITH OR WITHOUT PELVIS) 2-3V LEFT

[t hip ap left]
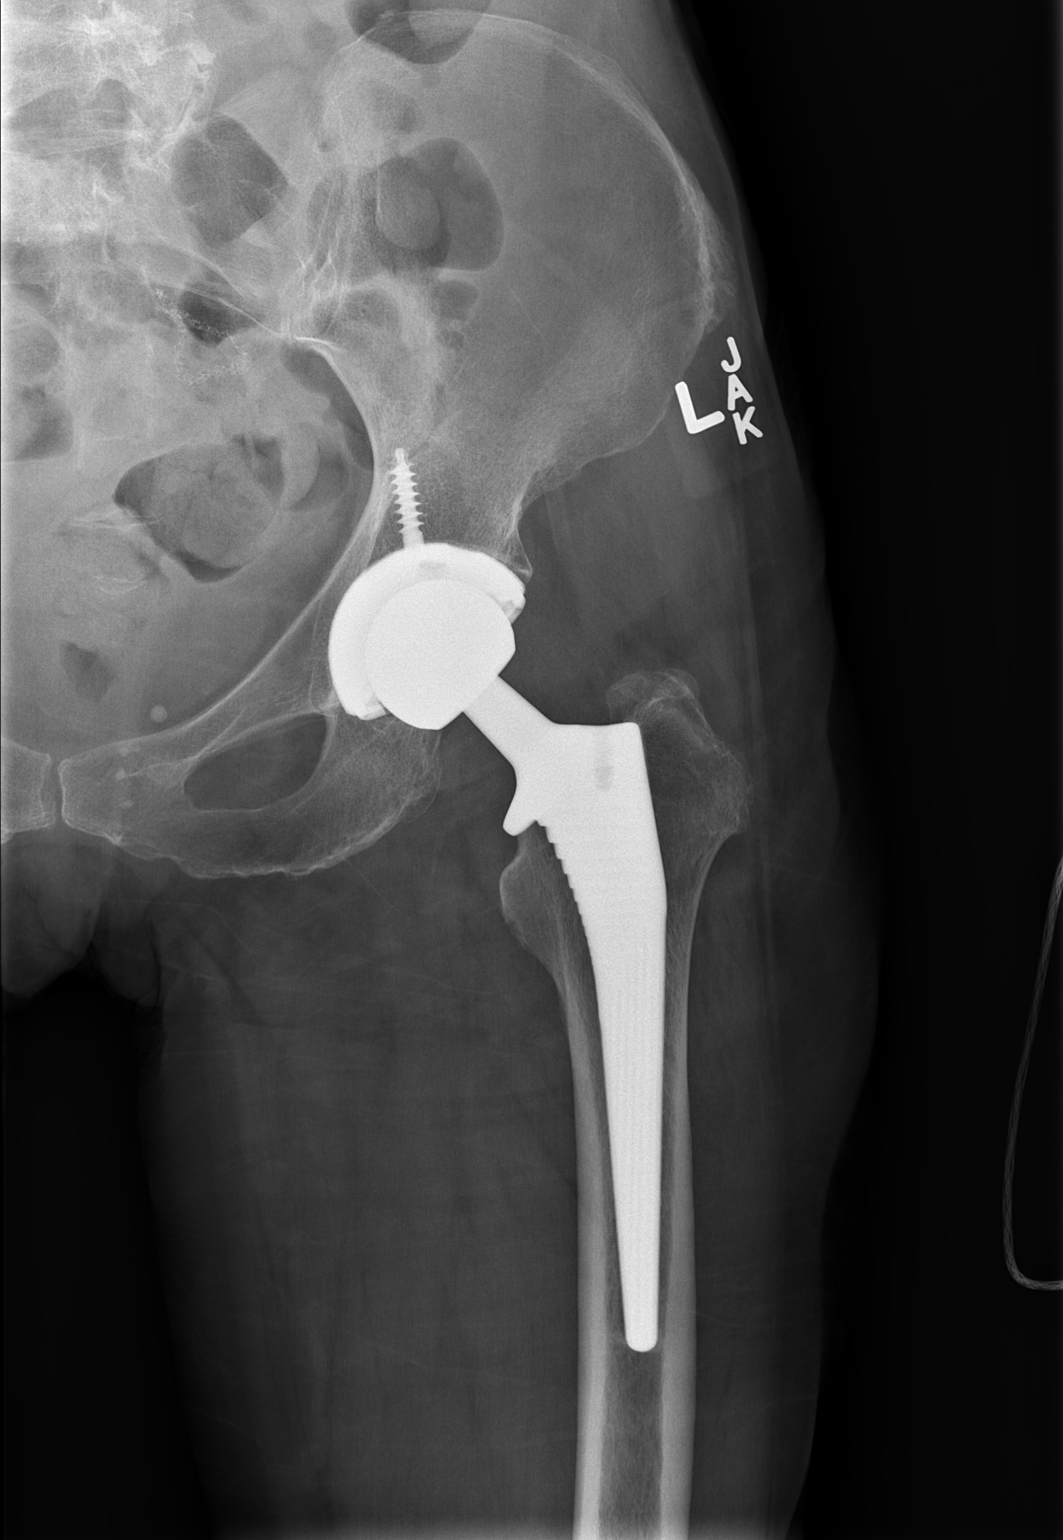

[t hip frog leg left]
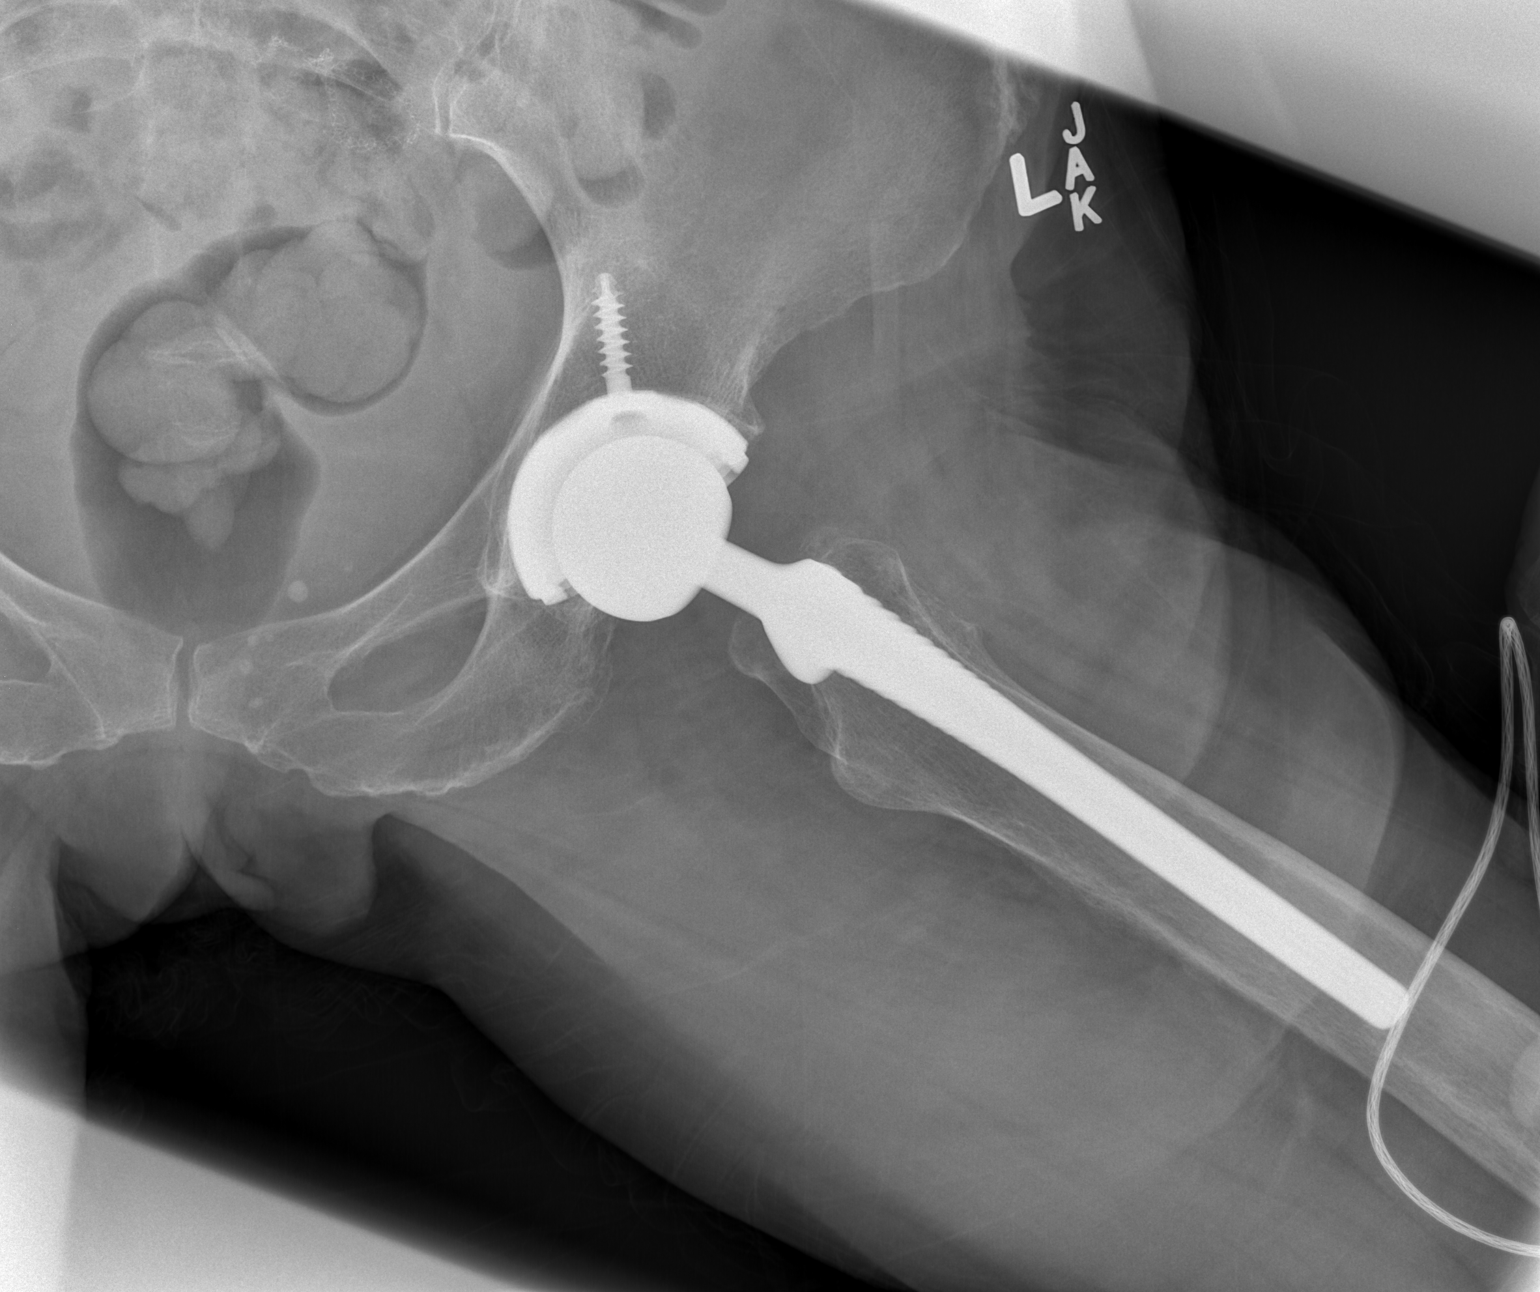

[2 of 2 positions shown; findings below may reference images not displayed]

FINDINGS: There is no acute fracture or dislocation. The bones are osteopenic.
There is a total left hip arthroplasty which appears intact.
Surgical suture noted over the pelvis. The soft tissues are grossly
unremarkable.
IMPRESSION: No acute fracture or dislocation.

## 2020-05-17 IMAGING — CR DG HIP (WITH OR WITHOUT PELVIS) 2-3V*R*
4 series · 4 of 4 positions shown · non-contrast
Comparison: Left hip radiograph dated 02/19/2018

CLINICAL DATA: 66-year-old female with fall.

EXAM:
LEFT KNEE - COMPLETE 4+ VIEW; DG HIP (WITH OR WITHOUT PELVIS) 2-3V
RIGHT; DG HIP (WITH OR WITHOUT PELVIS) 2-3V LEFT

[t pelvis ap (1 of 2)]
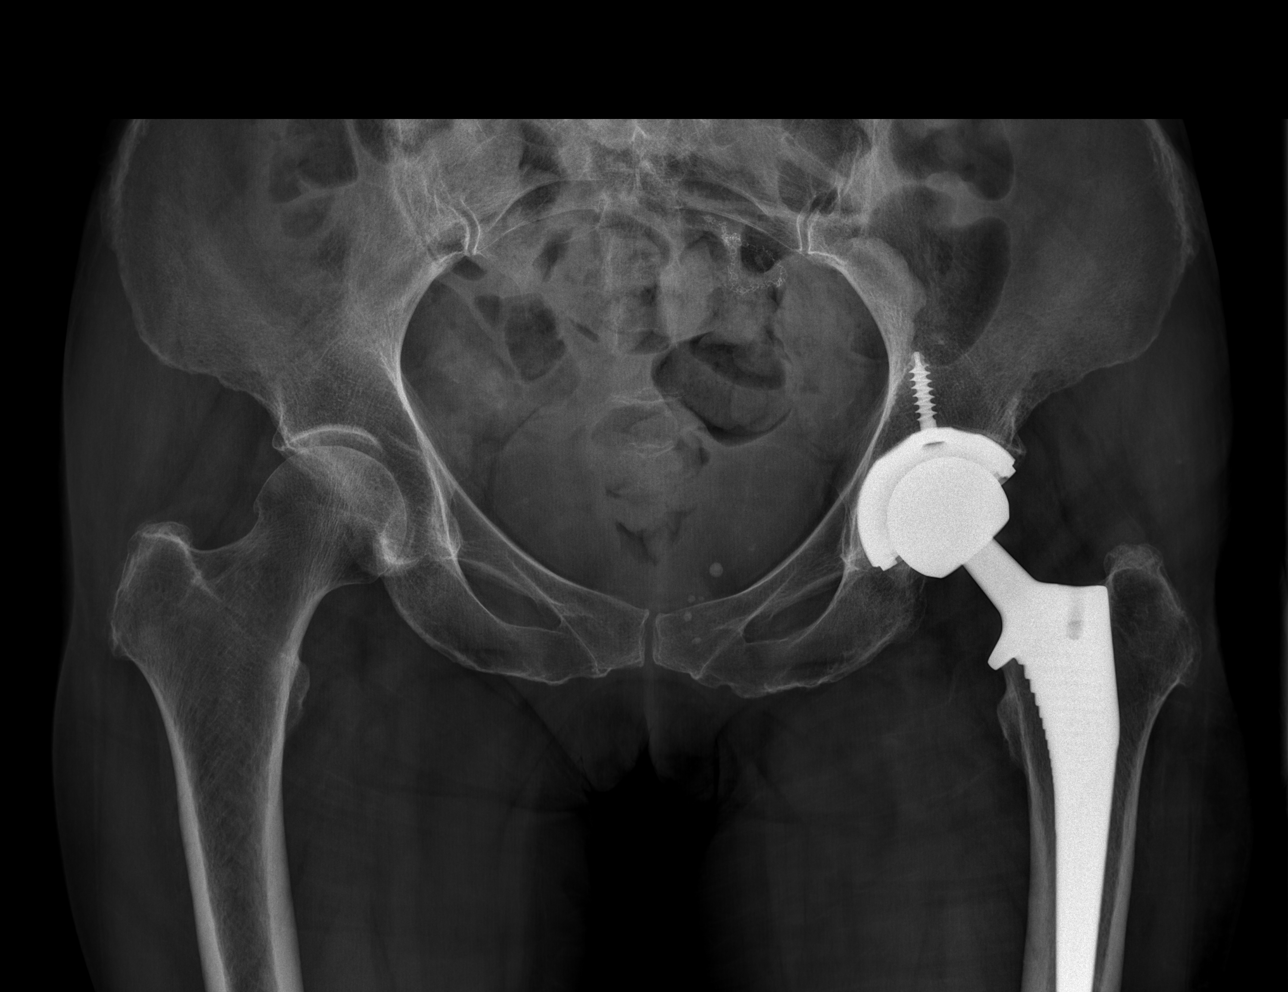

[t pelvis ap (2 of 2)]
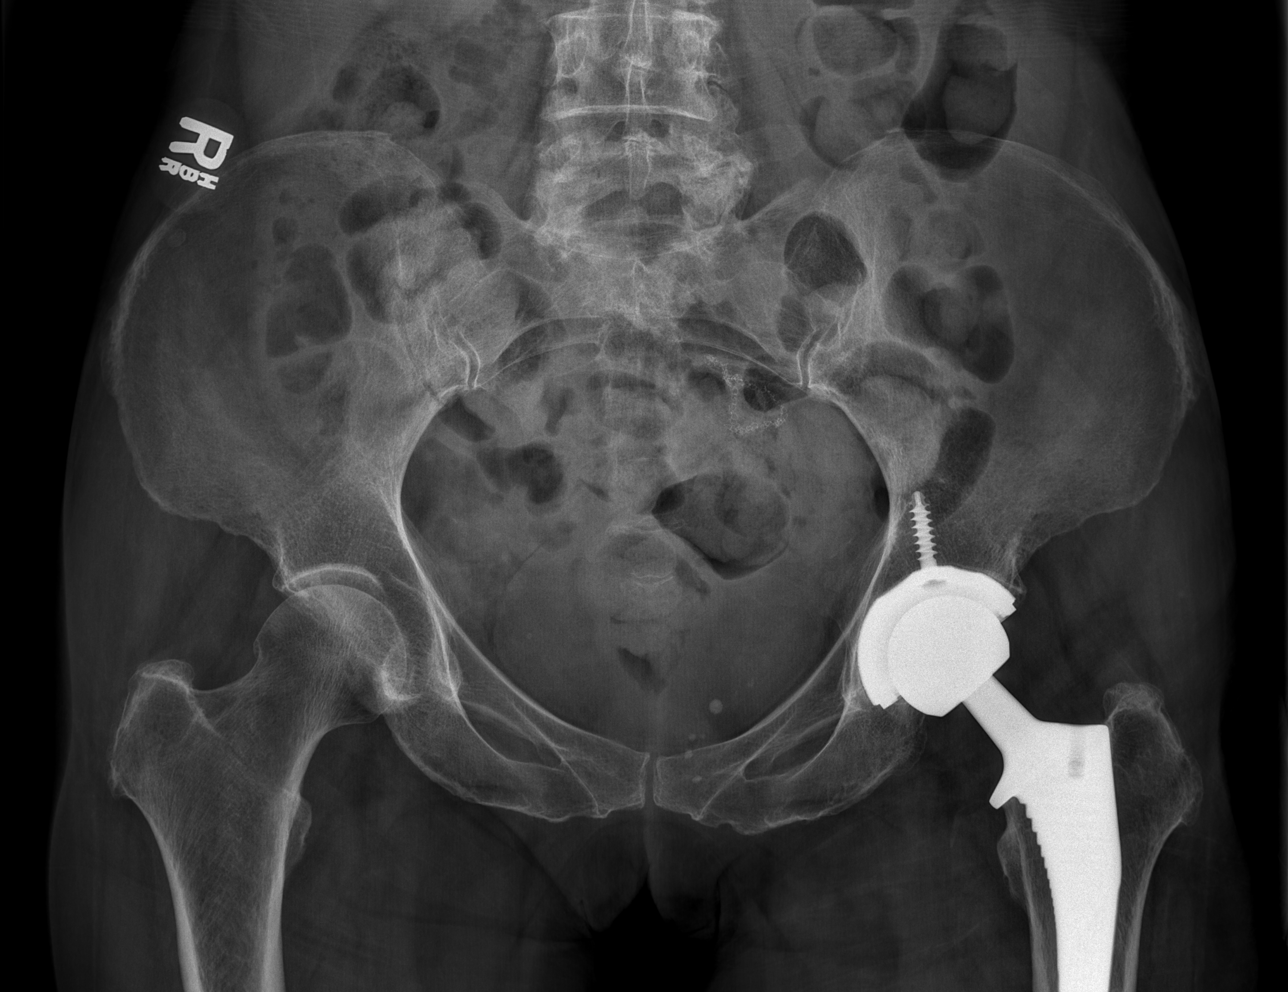

[t hip ap right]
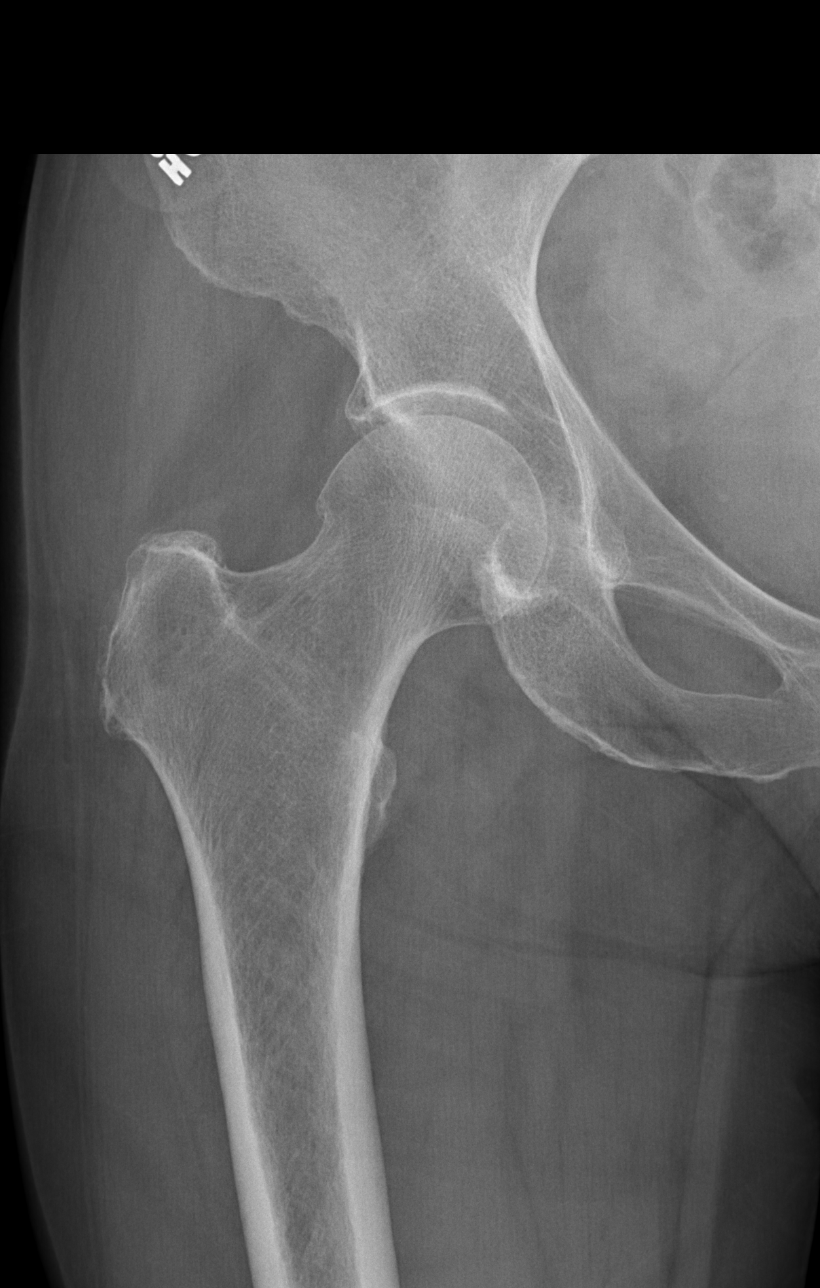

[t hip frog leg right]
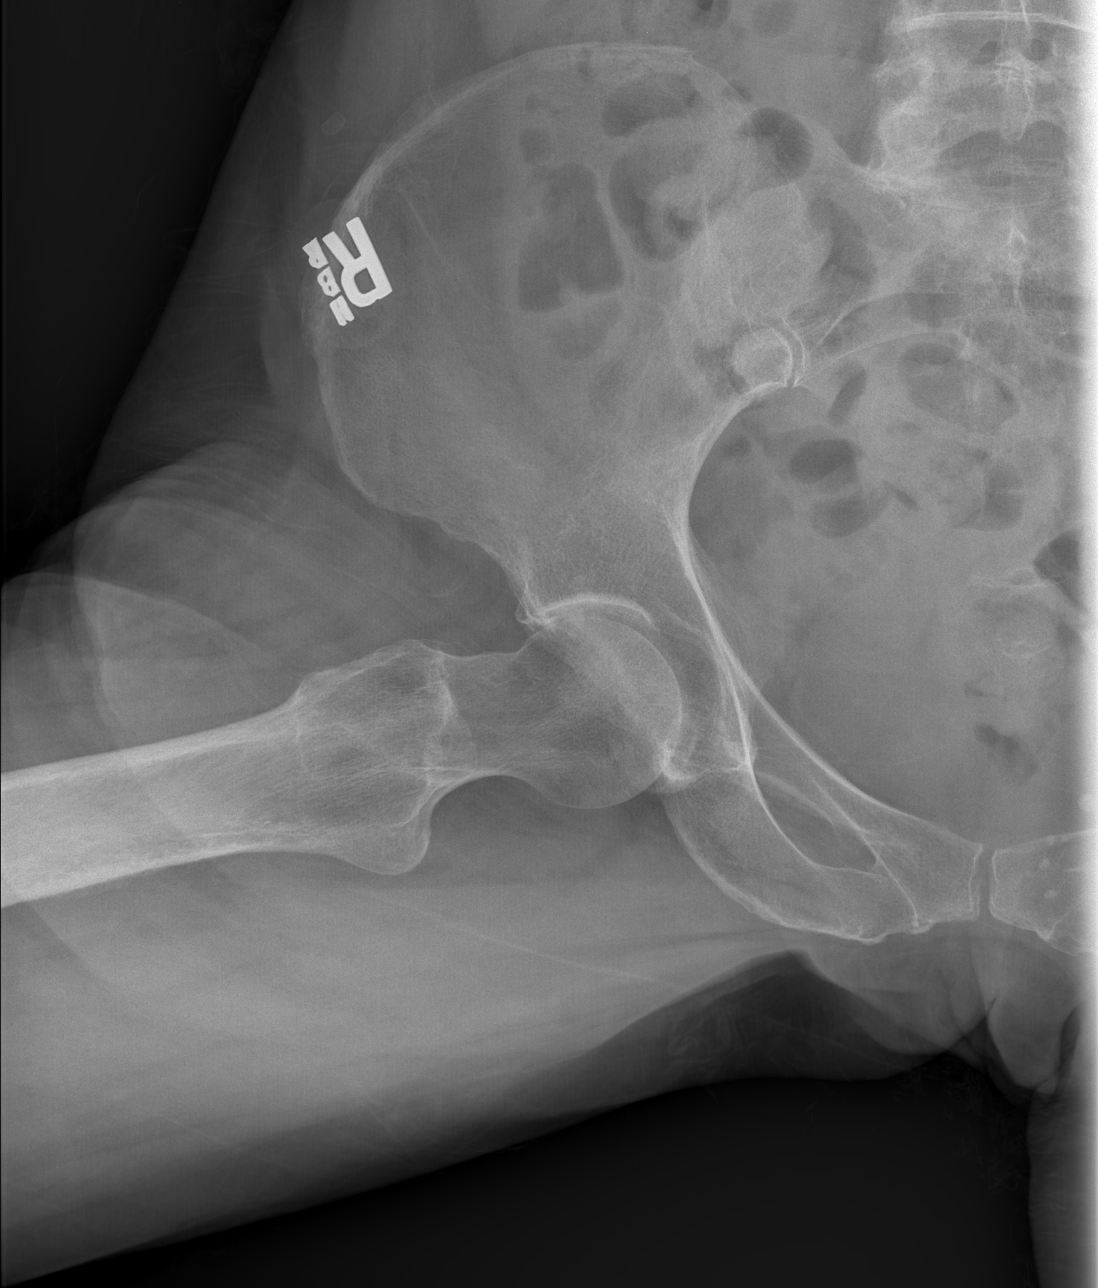

[4 of 4 positions shown; findings below may reference images not displayed]

FINDINGS: There is no acute fracture or dislocation. The bones are osteopenic.
There is a total left hip arthroplasty which appears intact.
Surgical suture noted over the pelvis. The soft tissues are grossly
unremarkable.
IMPRESSION: No acute fracture or dislocation.

## 2020-05-17 IMAGING — CR DG KNEE COMPLETE 4+V*L*
4 series · 4 of 4 positions shown · non-contrast
Comparison: Left hip radiograph dated 02/19/2018

CLINICAL DATA: 66-year-old female with fall.

EXAM:
LEFT KNEE - COMPLETE 4+ VIEW; DG HIP (WITH OR WITHOUT PELVIS) 2-3V
RIGHT; DG HIP (WITH OR WITHOUT PELVIS) 2-3V LEFT

[t knee ap left]
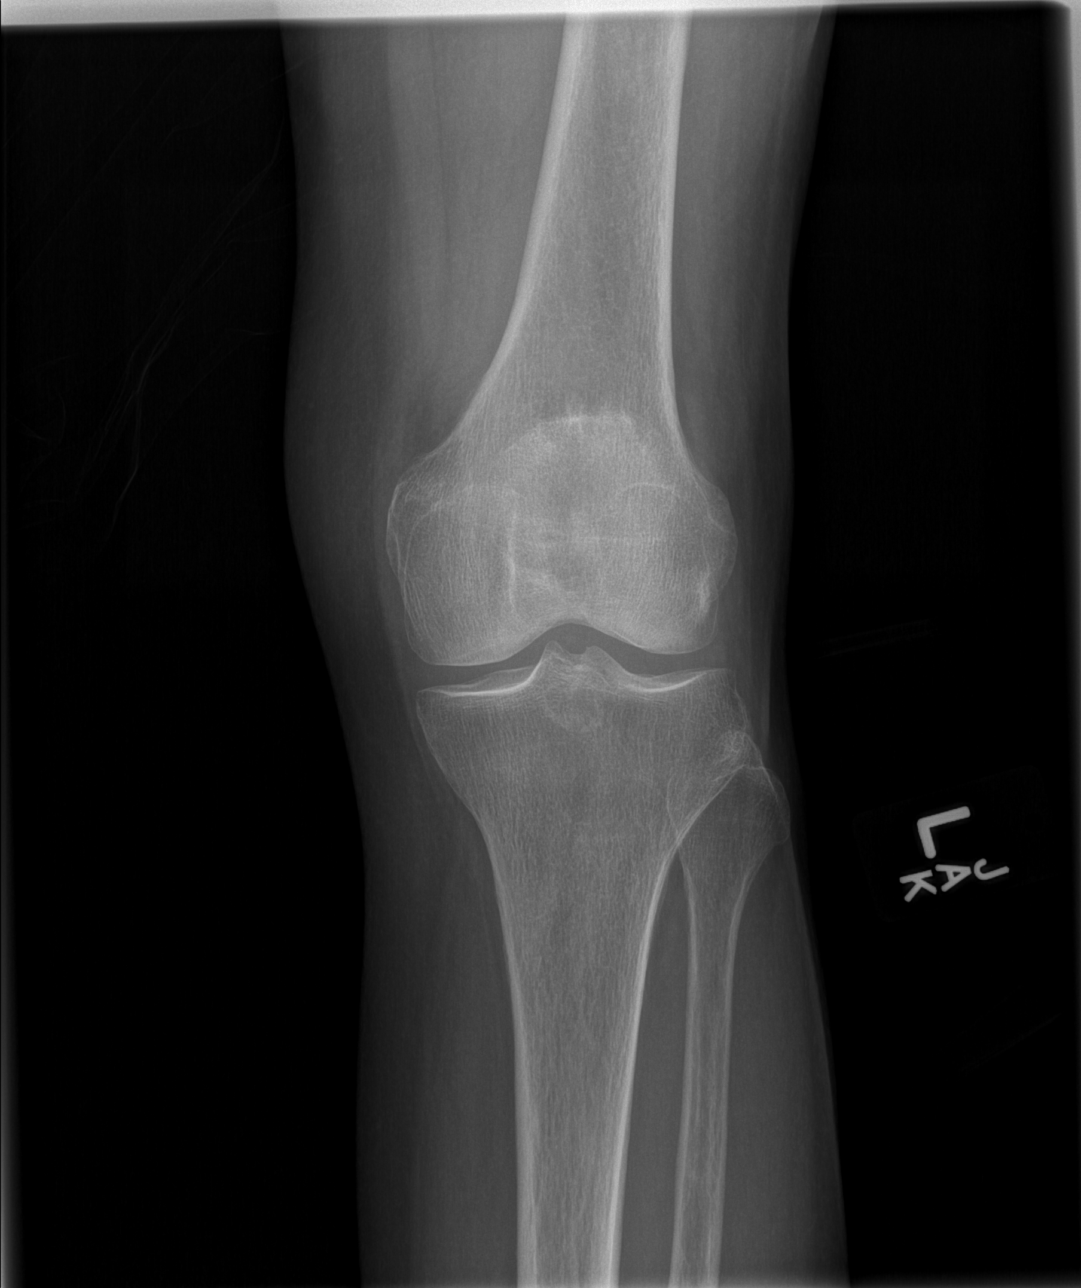

[t knee obl left (1 of 2)]
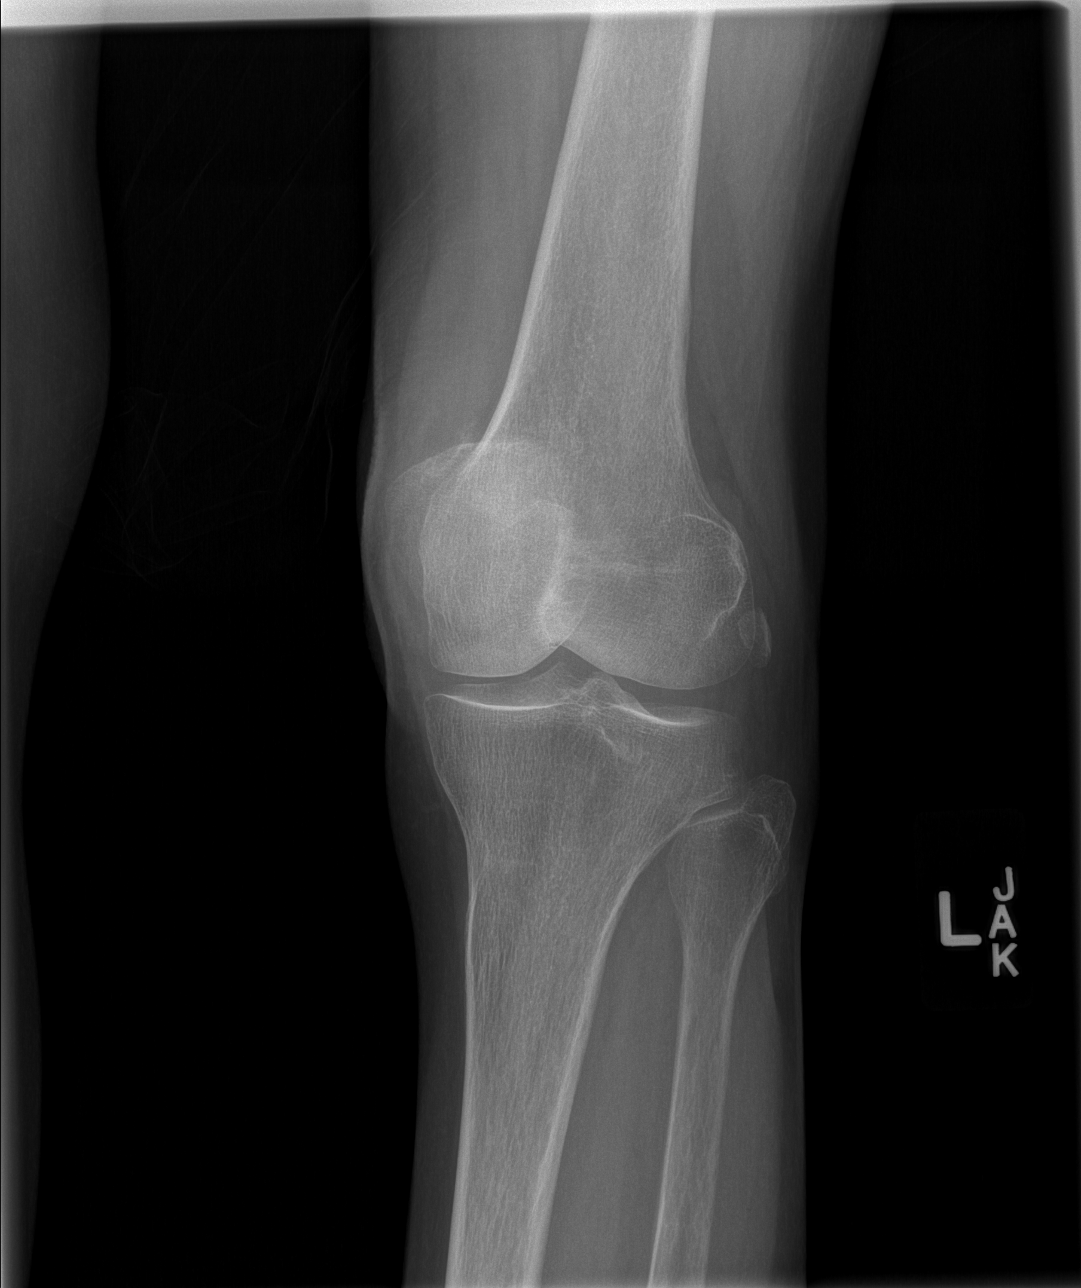

[t knee obl left (2 of 2)]
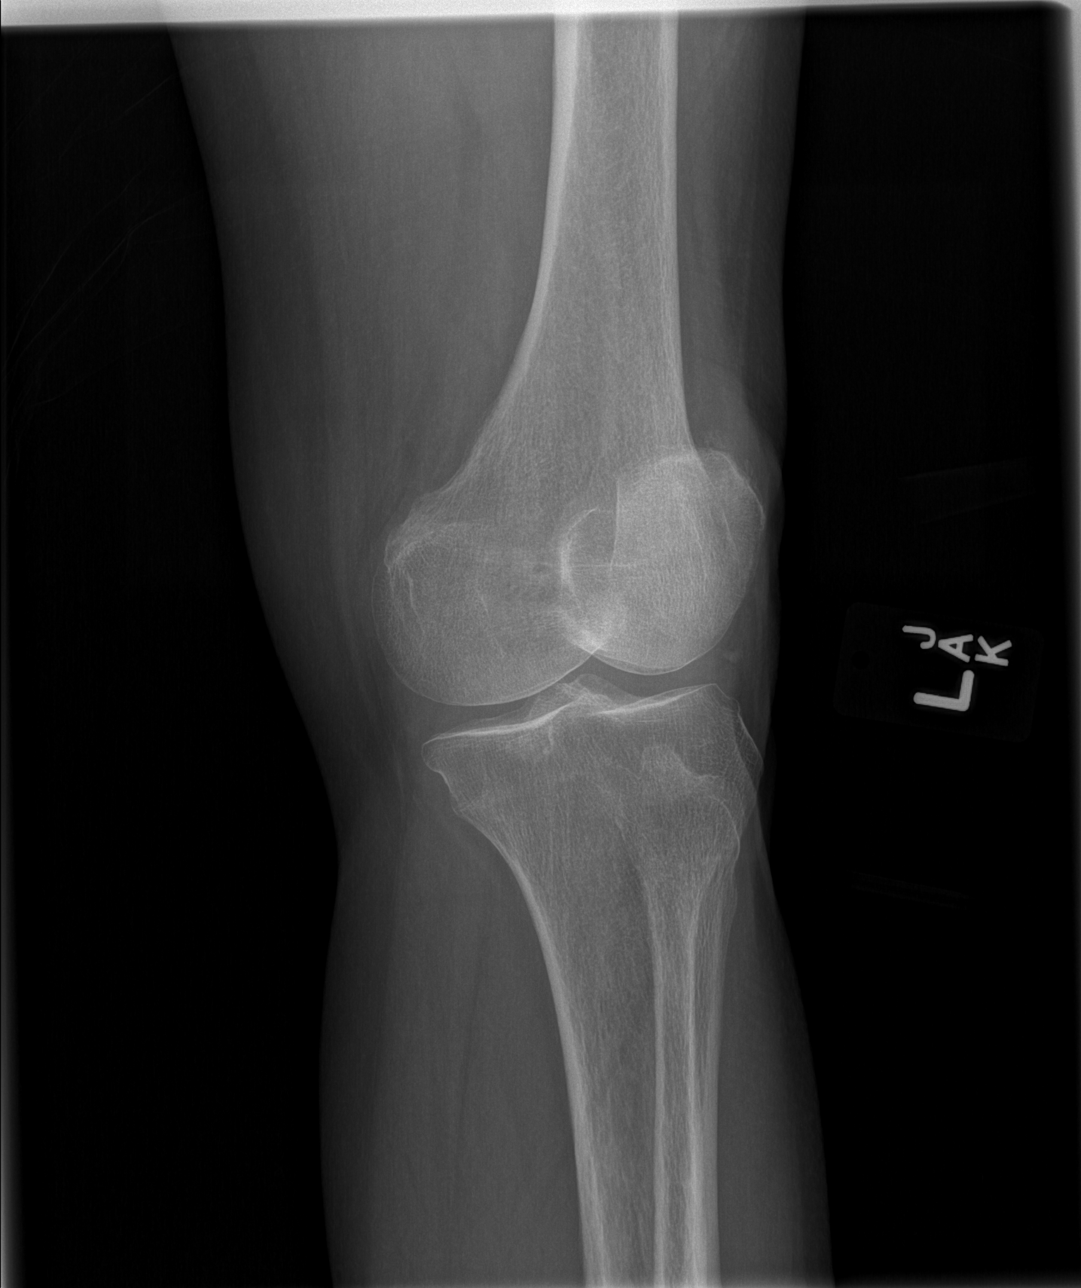

[t knee lat left]
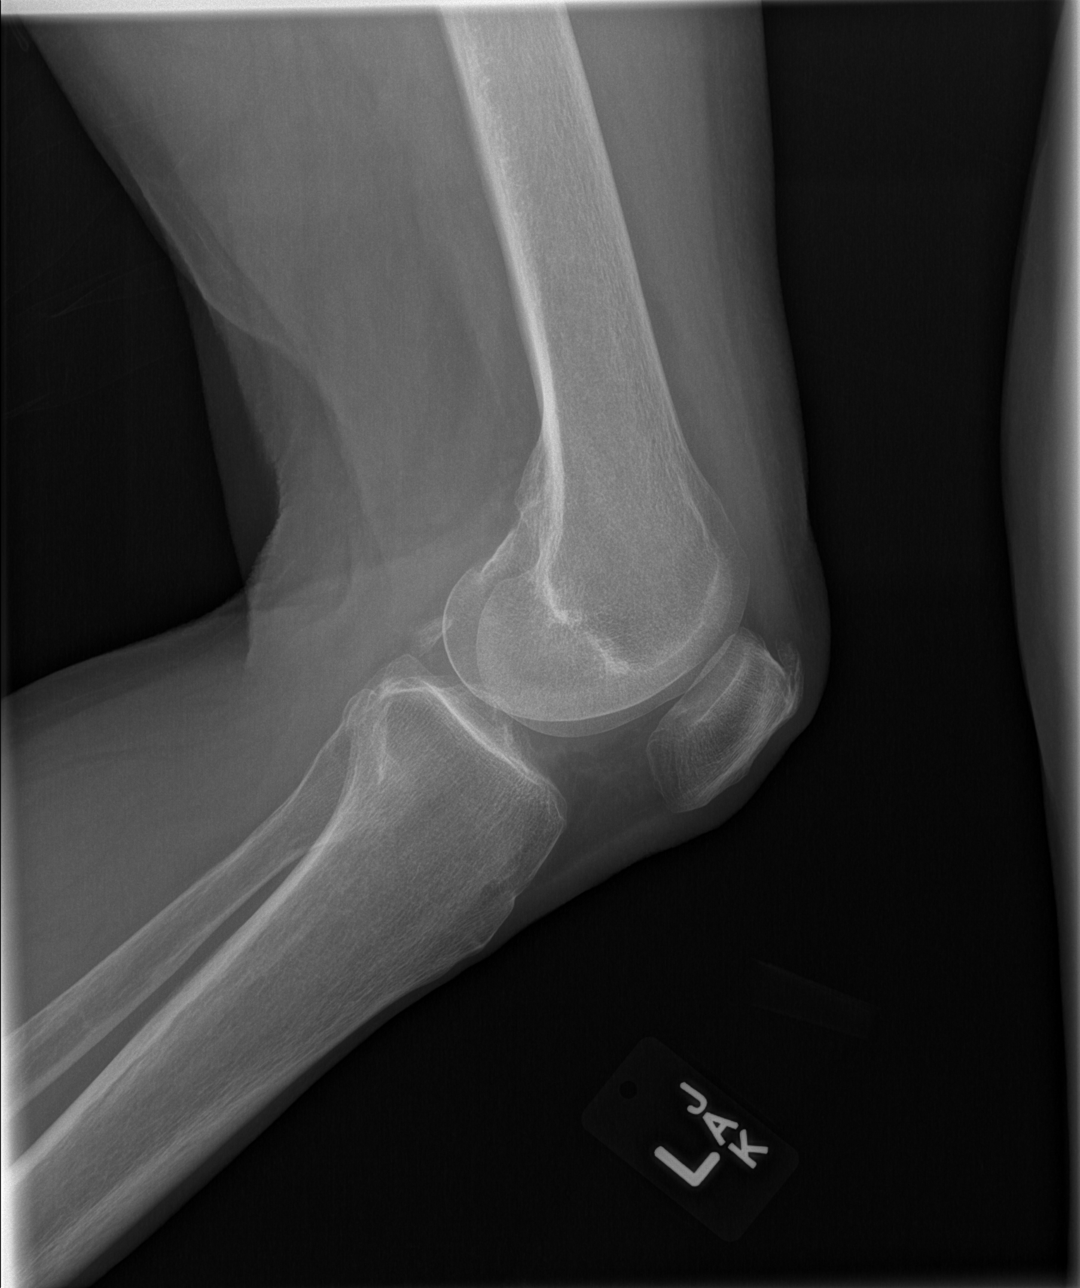

[4 of 4 positions shown; findings below may reference images not displayed]

FINDINGS: There is no acute fracture or dislocation. The bones are osteopenic.
There is a total left hip arthroplasty which appears intact.
Surgical suture noted over the pelvis. The soft tissues are grossly
unremarkable.
IMPRESSION: No acute fracture or dislocation.

## 2020-05-28 IMAGING — CT CT HEAD W/O CM
3 series · 15 of 47 positions shown, 18 images · non-contrast
Comparison: CT scan of March 31, 2017.

CLINICAL DATA: Altered level of consciousness.

EXAM:
CT HEAD WITHOUT CONTRAST
TECHNIQUE: Contiguous axial images were obtained from the base of the skull
through the vertex without intravenous contrast.

[Series 2: head wo · axial · 0.47mm/px · z∈[+1541,+1671]mm · 9 of 32 slices shown, 12 images]
[im 3/32  brain]
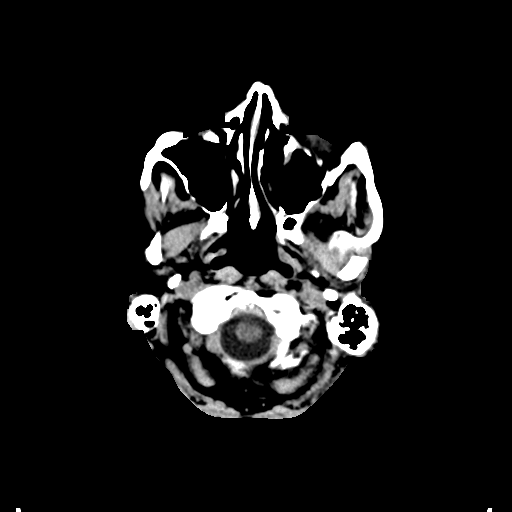
[im 3/32  bone]
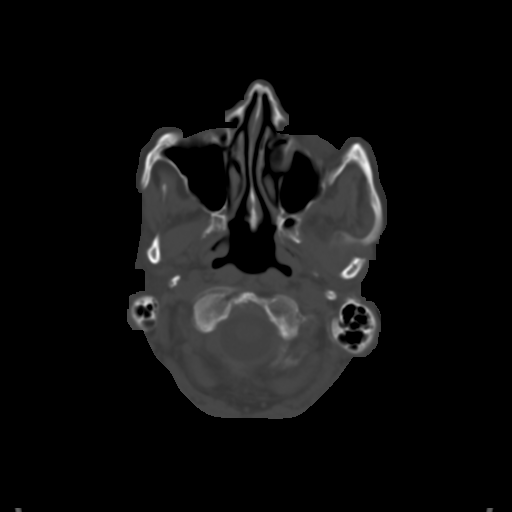
[im 6/32  brain]
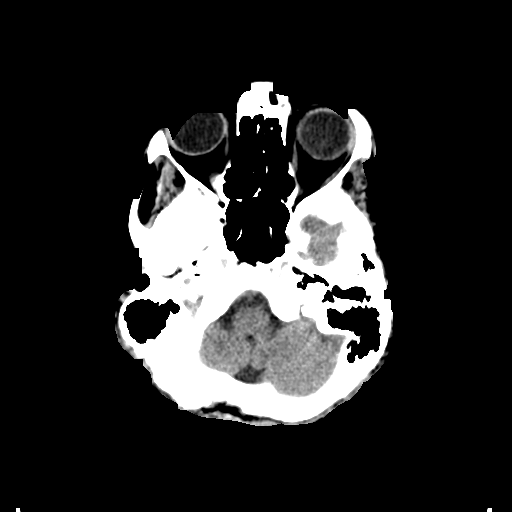
[im 9/32  brain]
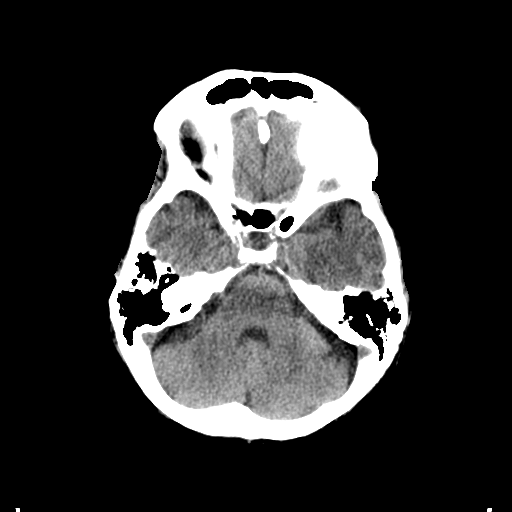
[im 12/32  brain]
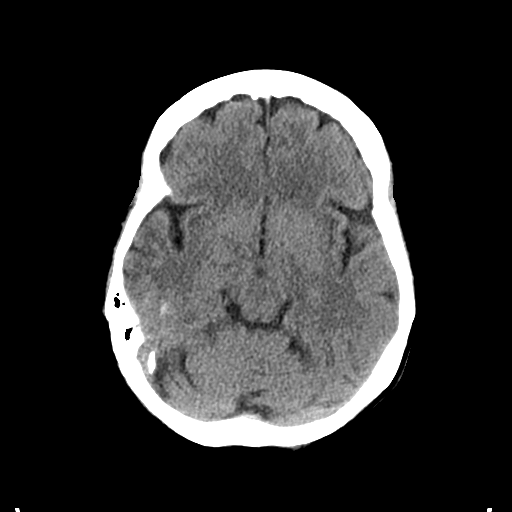
[im 17/32  brain]
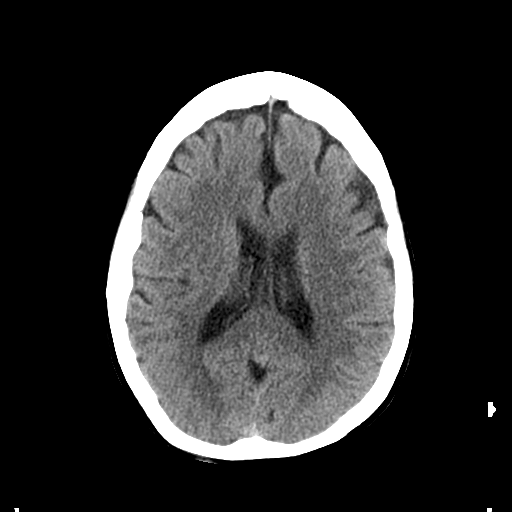
[im 17/32  bone]
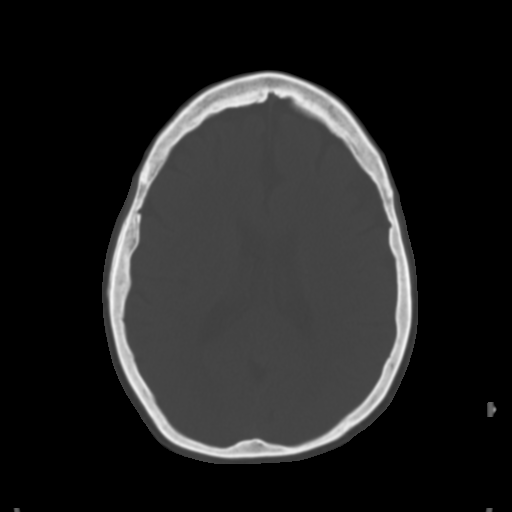
[im 20/32  brain]
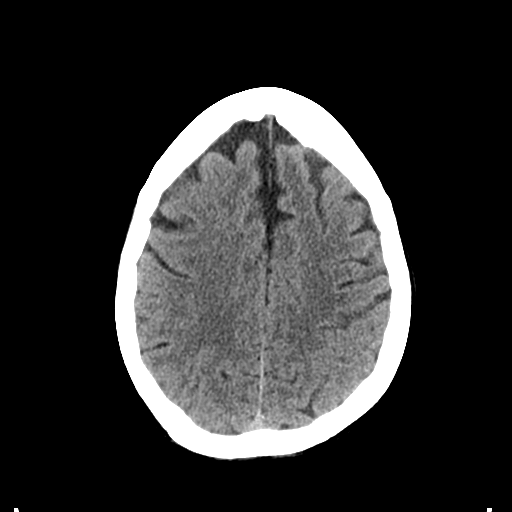
[im 23/32  brain]
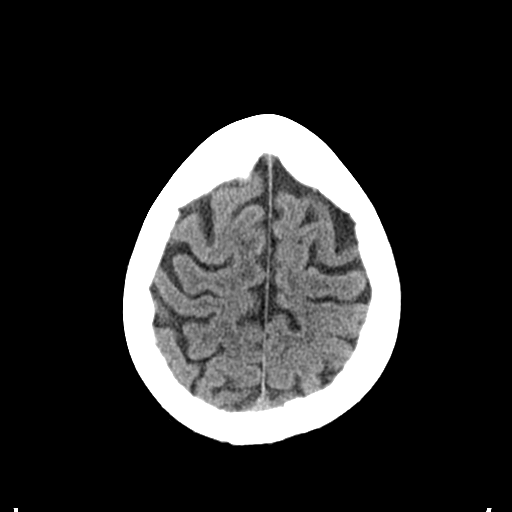
[im 26/32  brain]
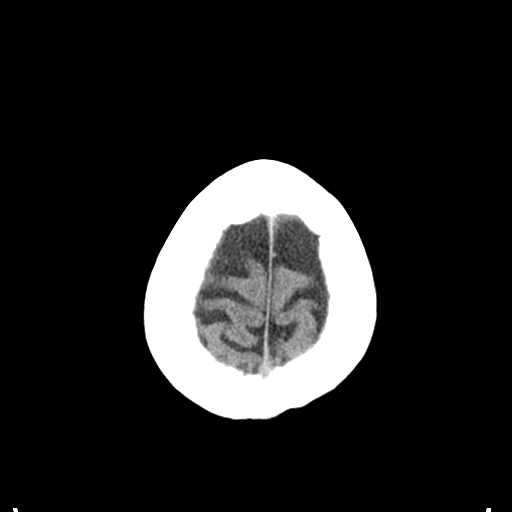
[im 29/32  brain]
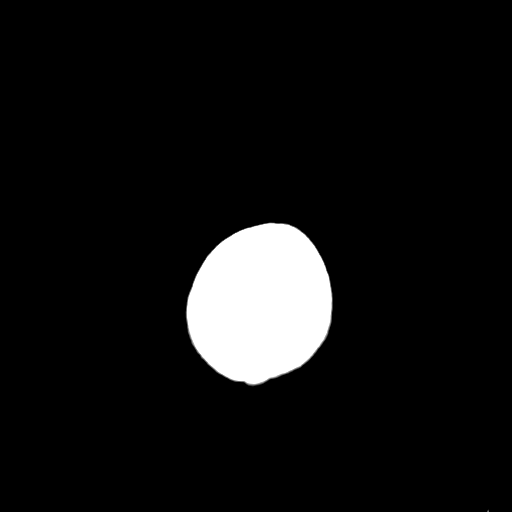
[im 29/32  bone]
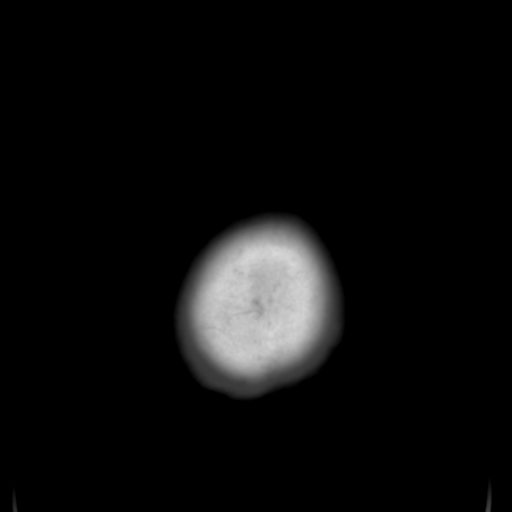

[Series 4: coronal soft tissue · coronal · 0.30mm/px · 3 of 70 slices shown]
[im 24/70  brain]
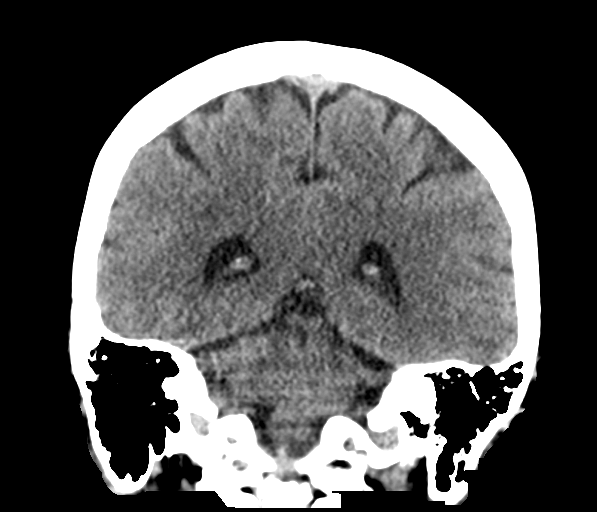
[im 31/70  brain]
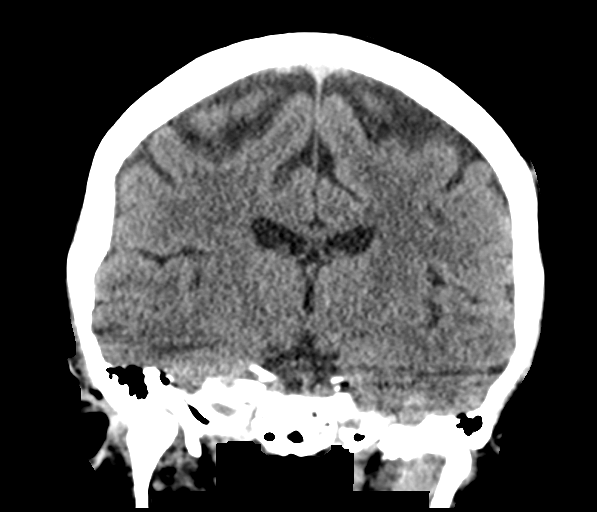
[im 39/70  brain]
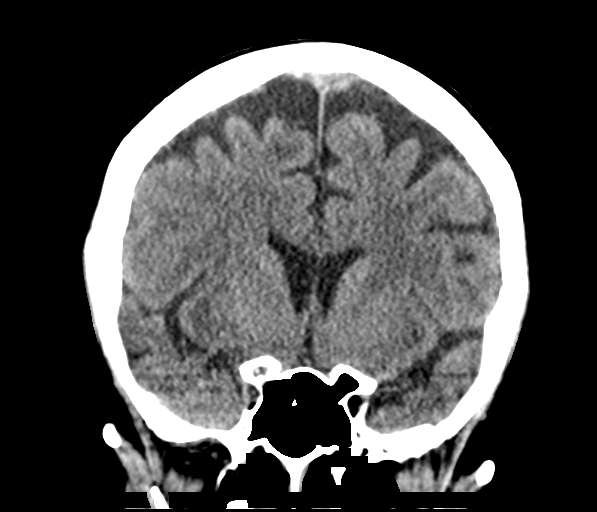

[Series 5: sagittal soft tissue · sagittal · 0.30mm/px · 3 of 61 slices shown]
[im 21/61  brain]
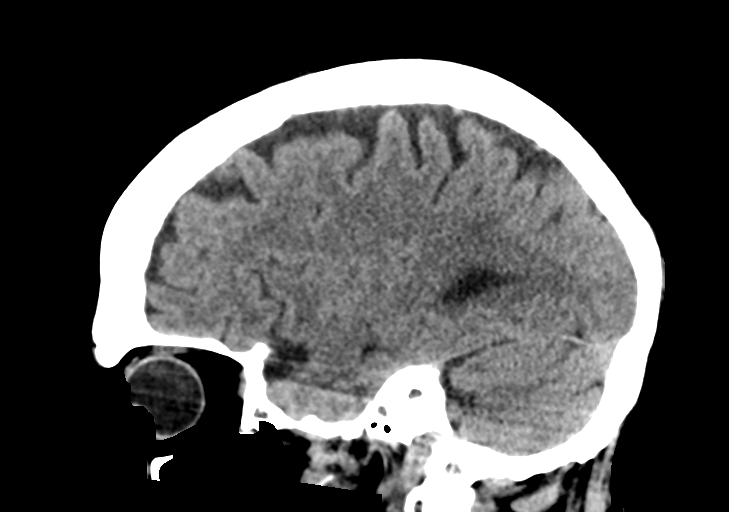
[im 31/61  brain]
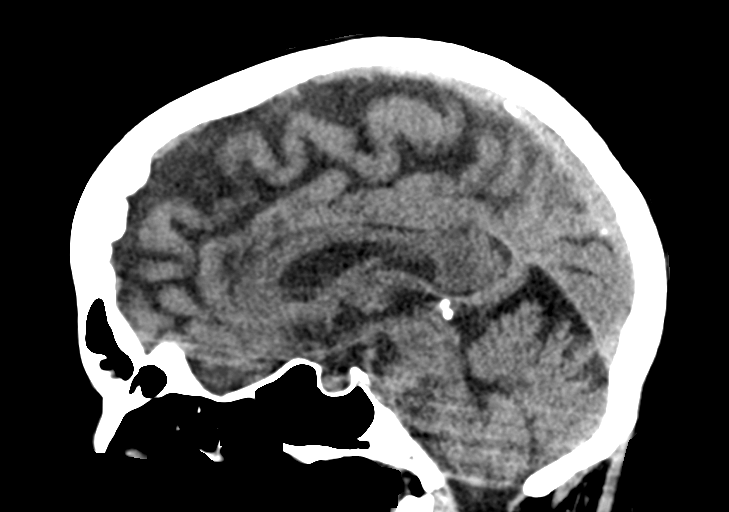
[im 41/61  brain]
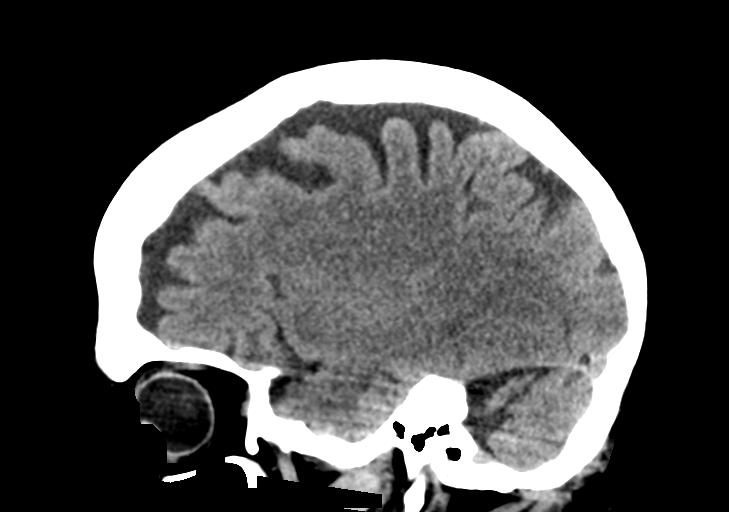

[15 of 47 positions shown; findings below may reference images not displayed]

FINDINGS: Brain: No evidence of acute infarction, hemorrhage, hydrocephalus,
extra-axial collection or mass lesion/mass effect.

Vascular: No hyperdense vessel or unexpected calcification.

Skull: Normal. Negative for fracture or focal lesion.

Sinuses/Orbits: No acute finding.

Other: None.
IMPRESSION: Normal head CT.

## 2020-05-28 IMAGING — CR DG CHEST 2V
2 series · 2 of 2 positions shown · non-contrast
Comparison: PA and lateral chest 01/24/2018.

CLINICAL DATA: Patient for memory care facility placement. Evaluate
for tuberculosis.

EXAM:
CHEST - 2 VIEW

[w chest pa]
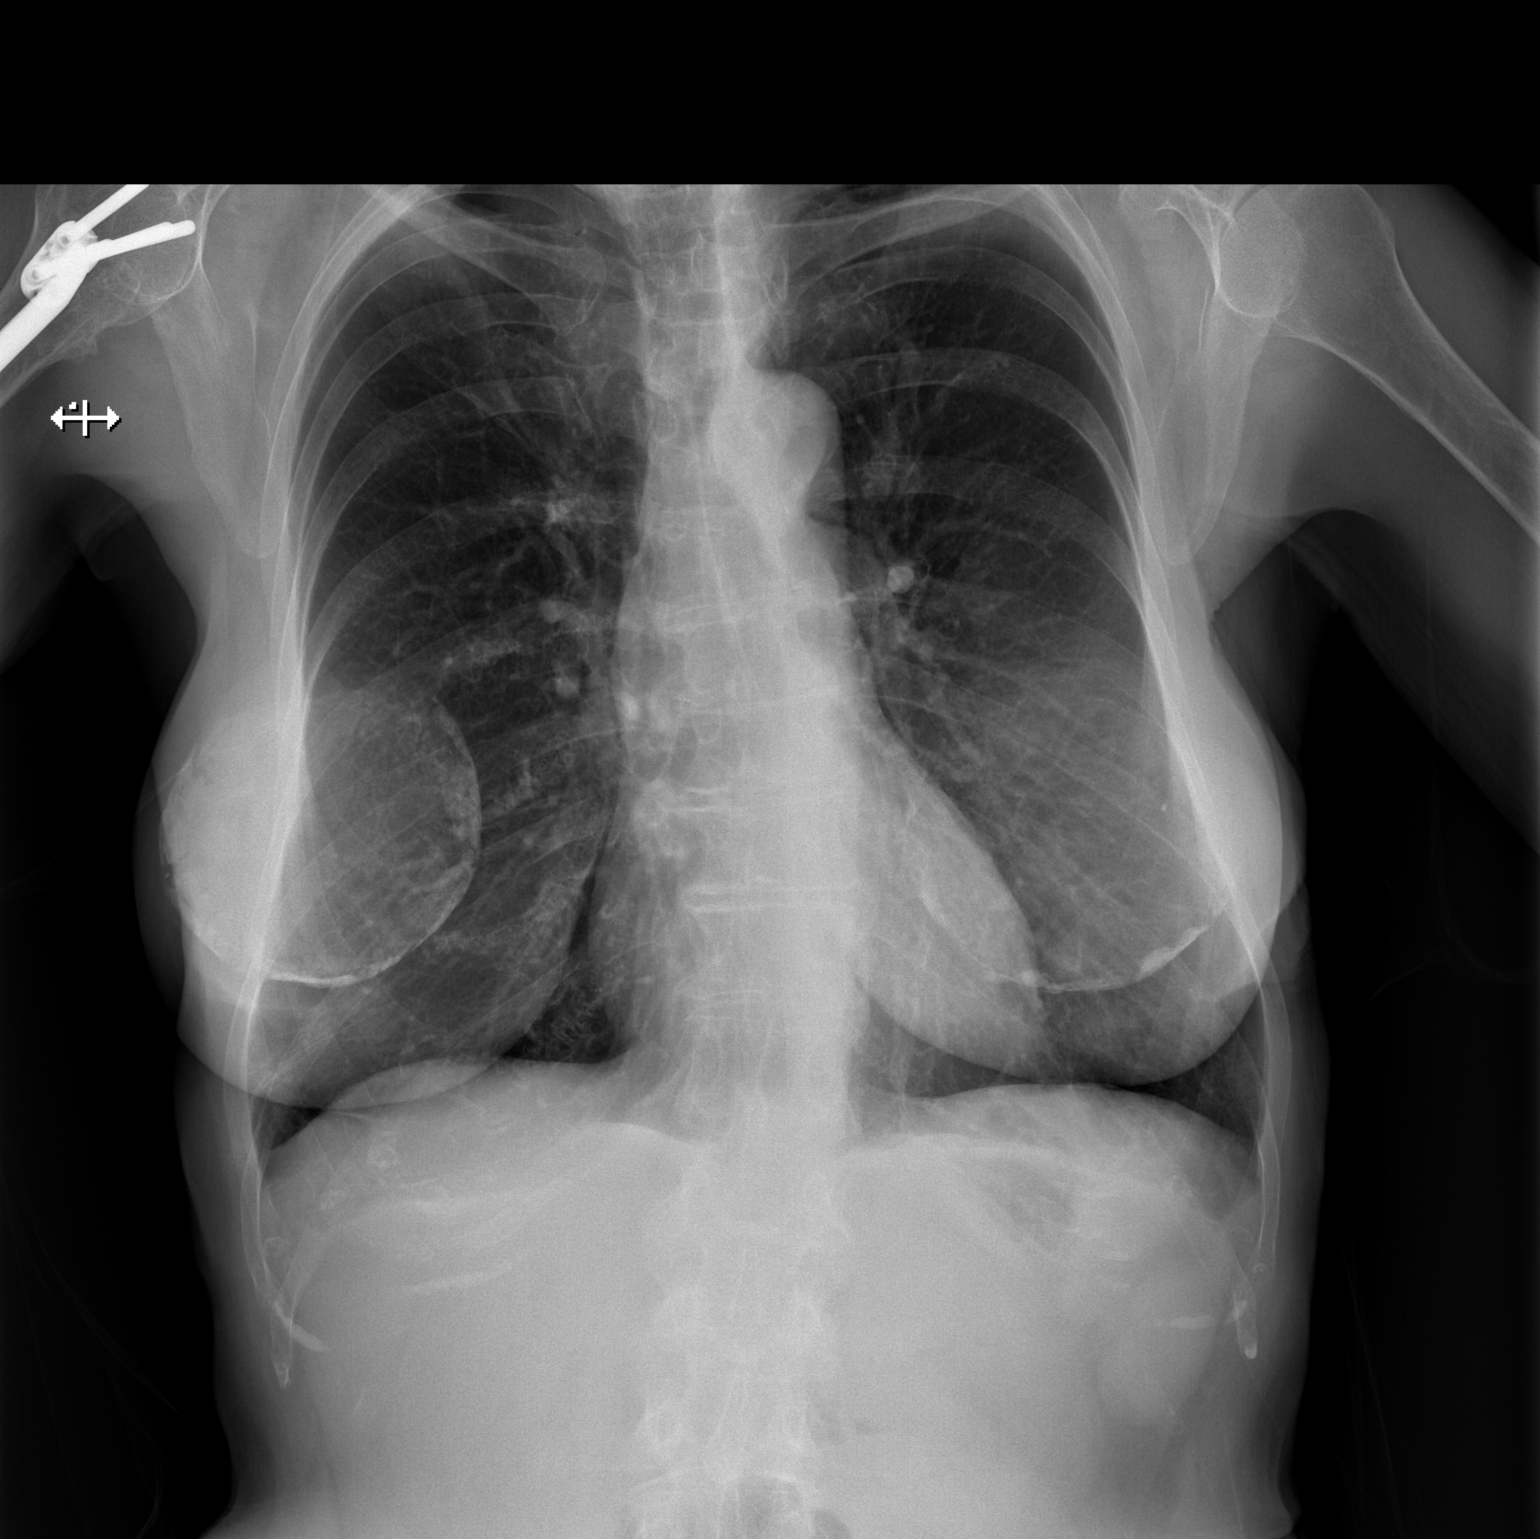

[w chest lat]
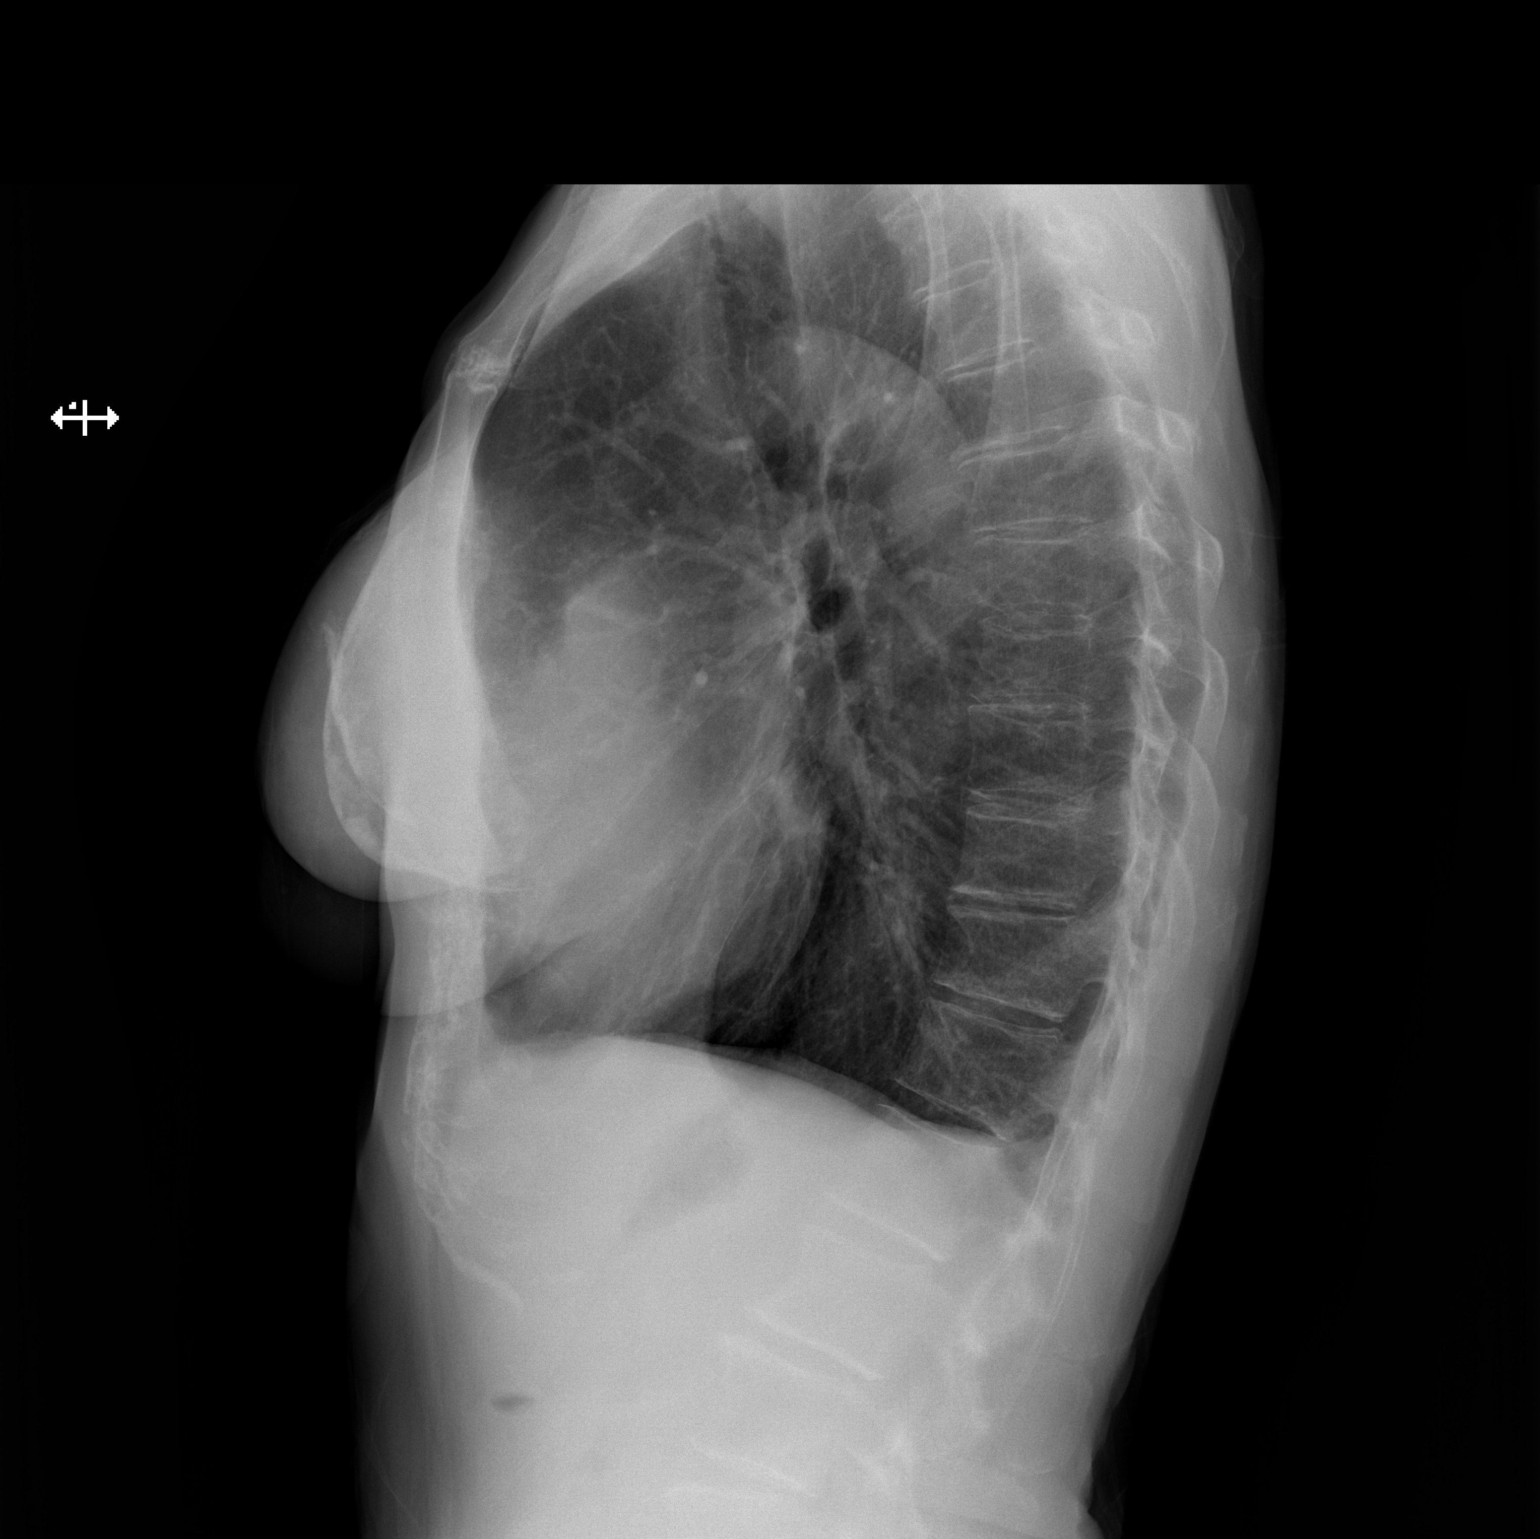

[2 of 2 positions shown; findings below may reference images not displayed]

FINDINGS: Lungs clear. Heart size normal. No pneumothorax or pleural effusion.
No acute or focal bony abnormality. Calcified breast implants and
postoperative change right humerus noted.
IMPRESSION: Negative for tuberculosis.  No acute disease.

## 2020-06-05 ENCOUNTER — Ambulatory Visit
Admission: RE | Admit: 2020-06-05 | Discharge: 2020-06-05 | Disposition: A | Discharge status: Home / Self Care | Payer: Medicare Other | Source: Ambulatory Visit | Attending: Neurology | Admitting: Neurology

## 2020-06-07 ENCOUNTER — Telehealth: Payer: Self-pay | Admitting: Neurology

## 2020-06-07 NOTE — Telephone Encounter (Signed)
Patient called in stating she received a call about her MRI results. She would like to find out the results.

## 2020-06-07 NOTE — Telephone Encounter (Signed)
Attempted to contact but no answer. Left message for patient to listen to previous voicemail with test results. Left test results on voicemail and advised patient to contact the office with any questions or concerns.

## 2020-07-04 ENCOUNTER — Other Ambulatory Visit: Payer: Self-pay | Admitting: Family Medicine

## 2020-07-04 DIAGNOSIS — E2839 Other primary ovarian failure: Secondary | ICD-10-CM

## 2020-07-25 IMAGING — CT CT MAXILLOFACIAL W/O CM
4 of 5 series · 16 of 47 positions shown, 18 images · non-contrast
Comparison: None.

CLINICAL DATA: Status post fall. Patient complains of double
vision. Bruising of the face.

EXAM:
CT HEAD WITHOUT CONTRAST
CT MAXILLOFACIAL WITHOUT CONTRAST
TECHNIQUE: Multidetector CT imaging of the head and maxillofacial structures
were performed using the standard protocol without intravenous
contrast. Multiplanar CT image reconstructions of the maxillofacial
structures were also generated.

[Series 3: head wo · axial · 0.38mm/px · z∈[+1298,+1398]mm · 6 of 29 slices shown, 8 images]
[im 5/29  brain]
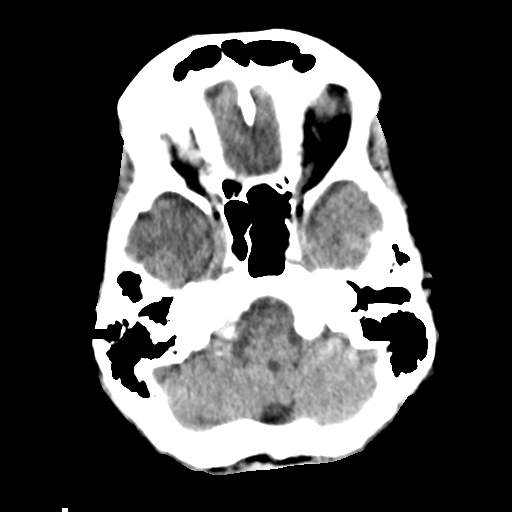
[im 5/29  bone]
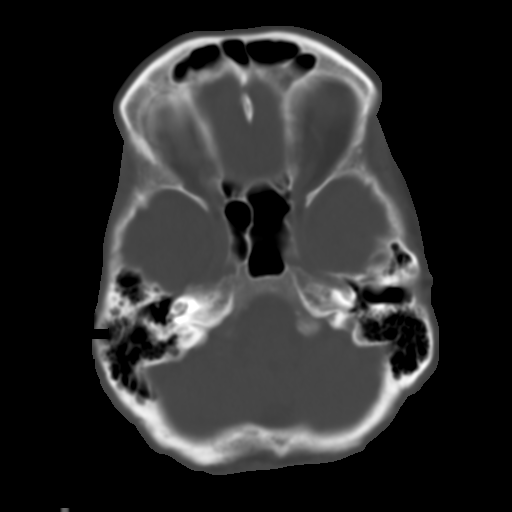
[im 9/29  bone]
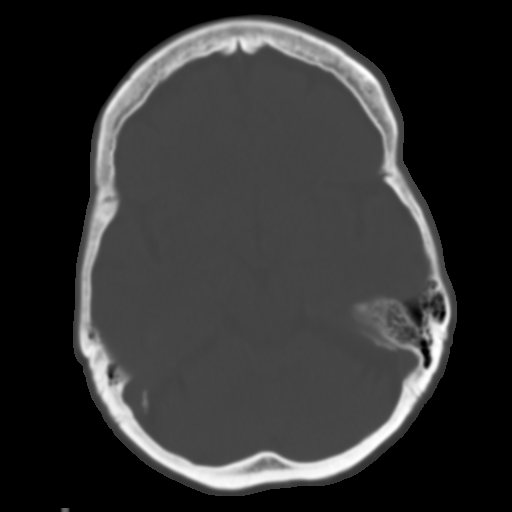
[im 13/29  bone]
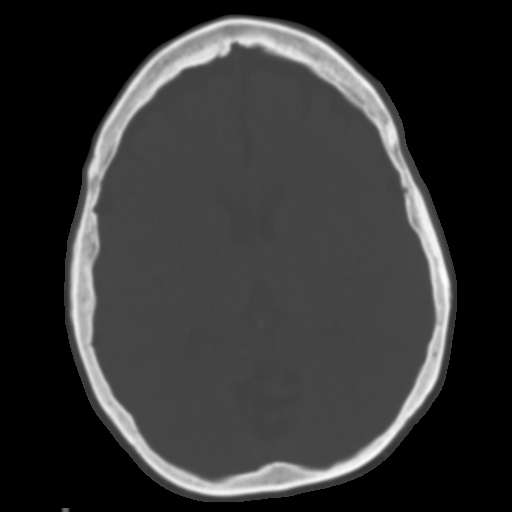
[im 17/29  bone]
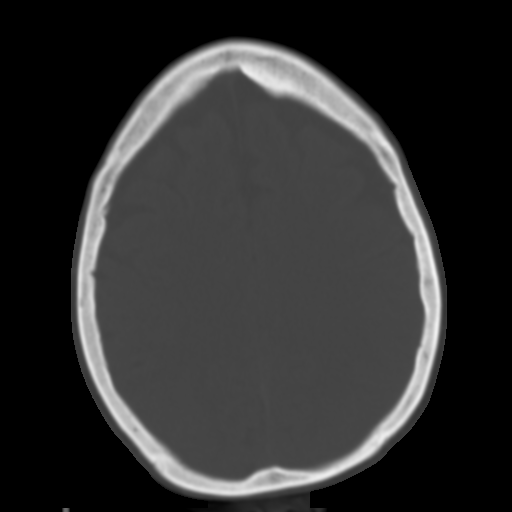
[im 21/29  brain]
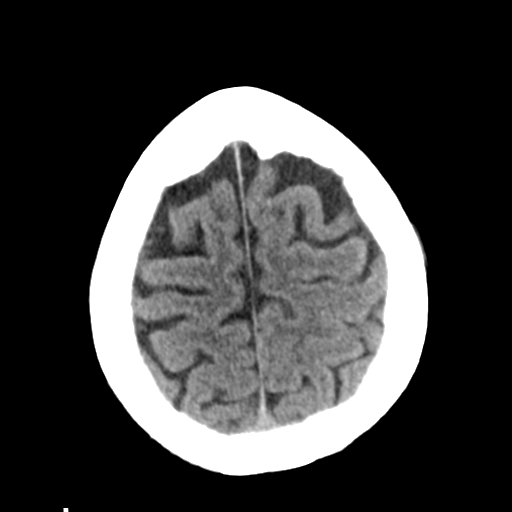
[im 21/29  bone]
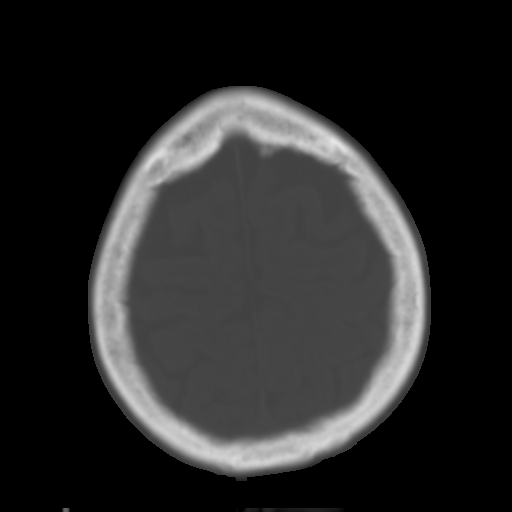
[im 25/29  bone]
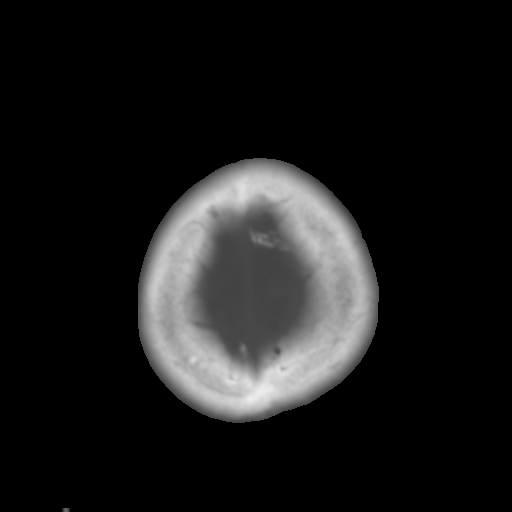

[Series 7: sagittal soft tissue · sagittal · 0.28mm/px · 3 of 51 slices shown]
[im 17/51  bone]
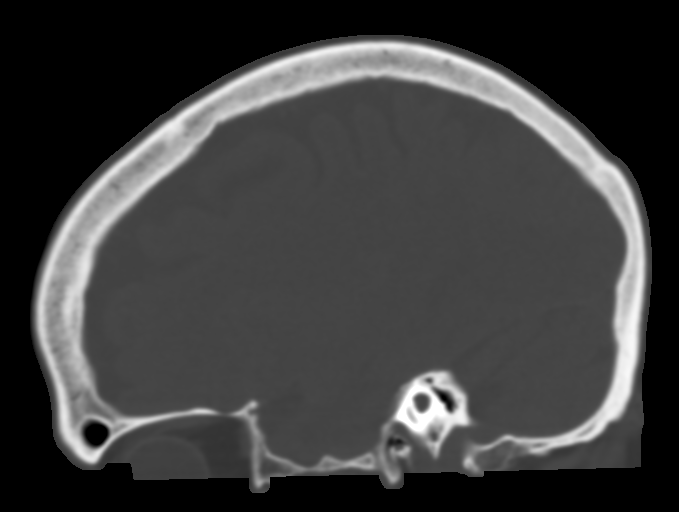
[im 26/51  bone]
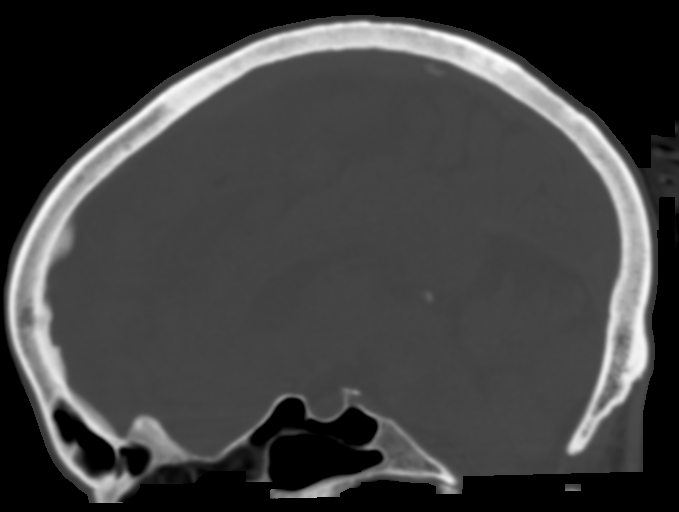
[im 34/51  bone]
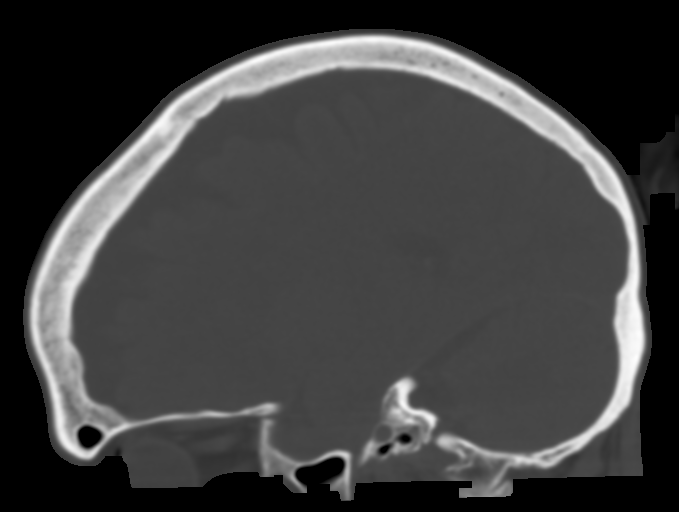

[Series 8: max soft · axial · 0.31mm/px · z∈[+1180,+1240]mm · 4 of 79 slices shown]
[im 8/79  brain]
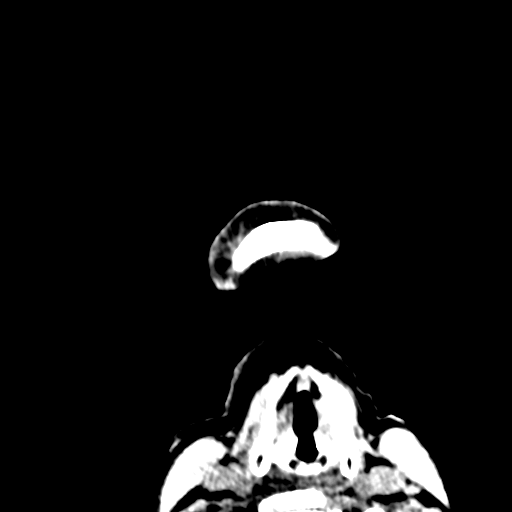
[im 15/79  brain]
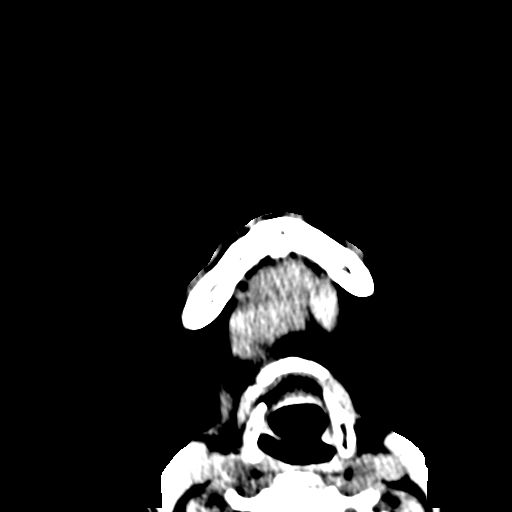
[im 23/79  brain]
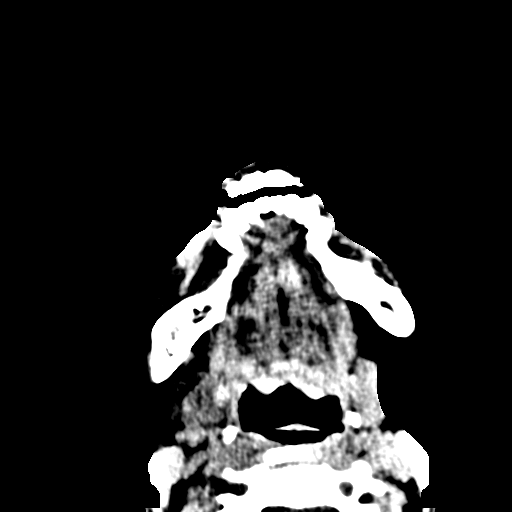
[im 38/79  brain]
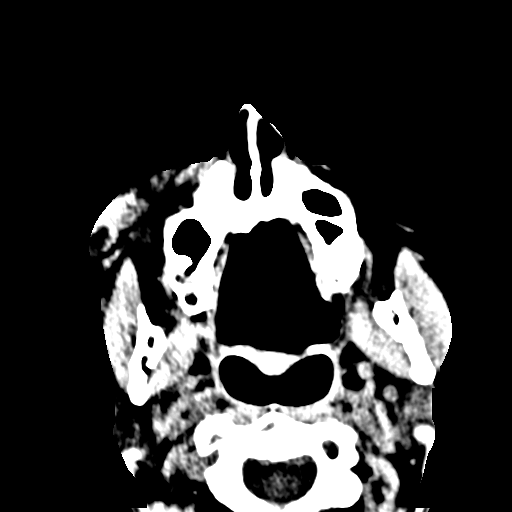

[Series 12: coronal soft · coronal · 0.29mm/px · 3 of 60 slices shown]
[im 10/60  bone]
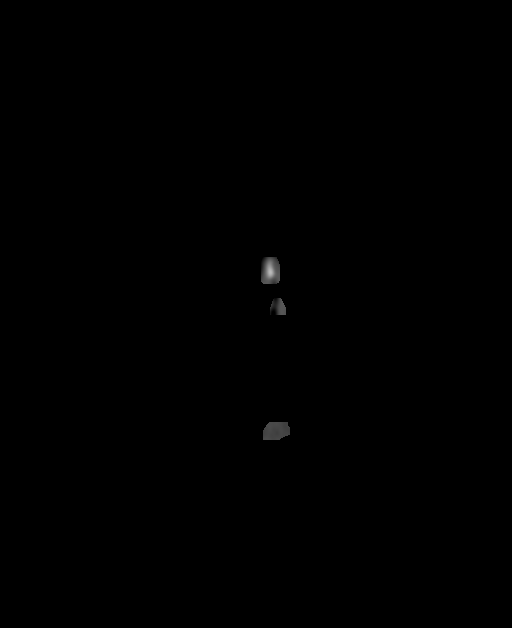
[im 22/60  bone]
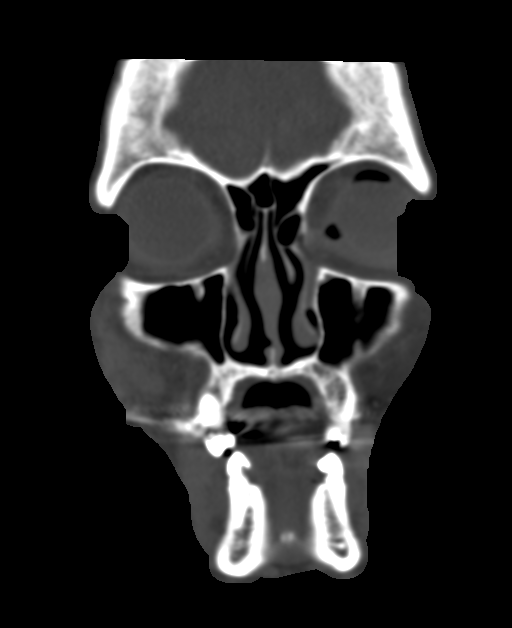
[im 35/60  bone]
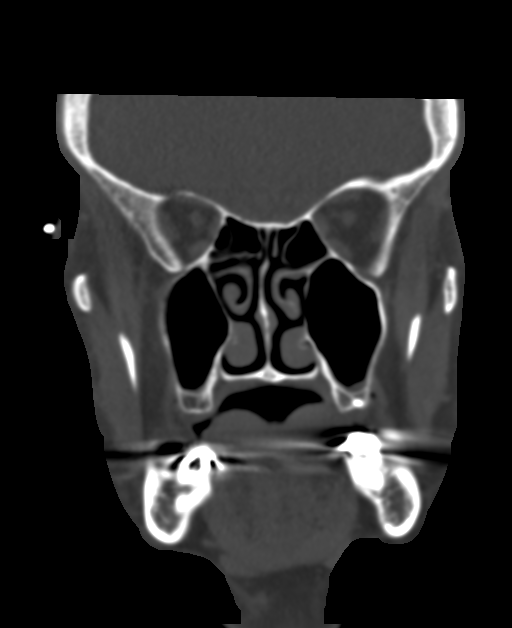

[16 of 47 positions shown; findings below may reference images not displayed]

FINDINGS: CT HEAD FINDINGS

Brain: No evidence of acute infarction, hemorrhage, hydrocephalus,
extra-axial collection or mass lesion/mass effect. Brain parenchymal
volume loss.

Vascular: Calcific atherosclerotic disease of the intra cavernous
carotid arteries.

Skull: Normal. Negative for fracture or focal lesion.

Other: None.

CT MAXILLOFACIAL FINDINGS

Osseous: Minimally displaced bilateral nasal fractures. The nasal
septum is intact. Poor dentition with several broken, missing teeth
and large cavities.

Orbits: Negative. No traumatic or inflammatory finding.

Sinuses: Mild mucosal thickening of the ethmoid sinuses, otherwise
clear.

Soft tissues: Right buccal hematoma.
IMPRESSION: No acute intracranial abnormality.

Brain parenchymal volume loss.

Minimally displaced bilateral nasal fractures with right buccal
hematoma.

## 2020-07-30 IMAGING — CR DG KNEE COMPLETE 4+V*L*
4 series · 4 of 4 positions shown · non-contrast
Comparison: 04/19/2018

CLINICAL DATA: Fell on 06/25/2018. Persistent knee pain and
swelling.

EXAM:
LEFT KNEE - COMPLETE 4+ VIEW

[knee ap]
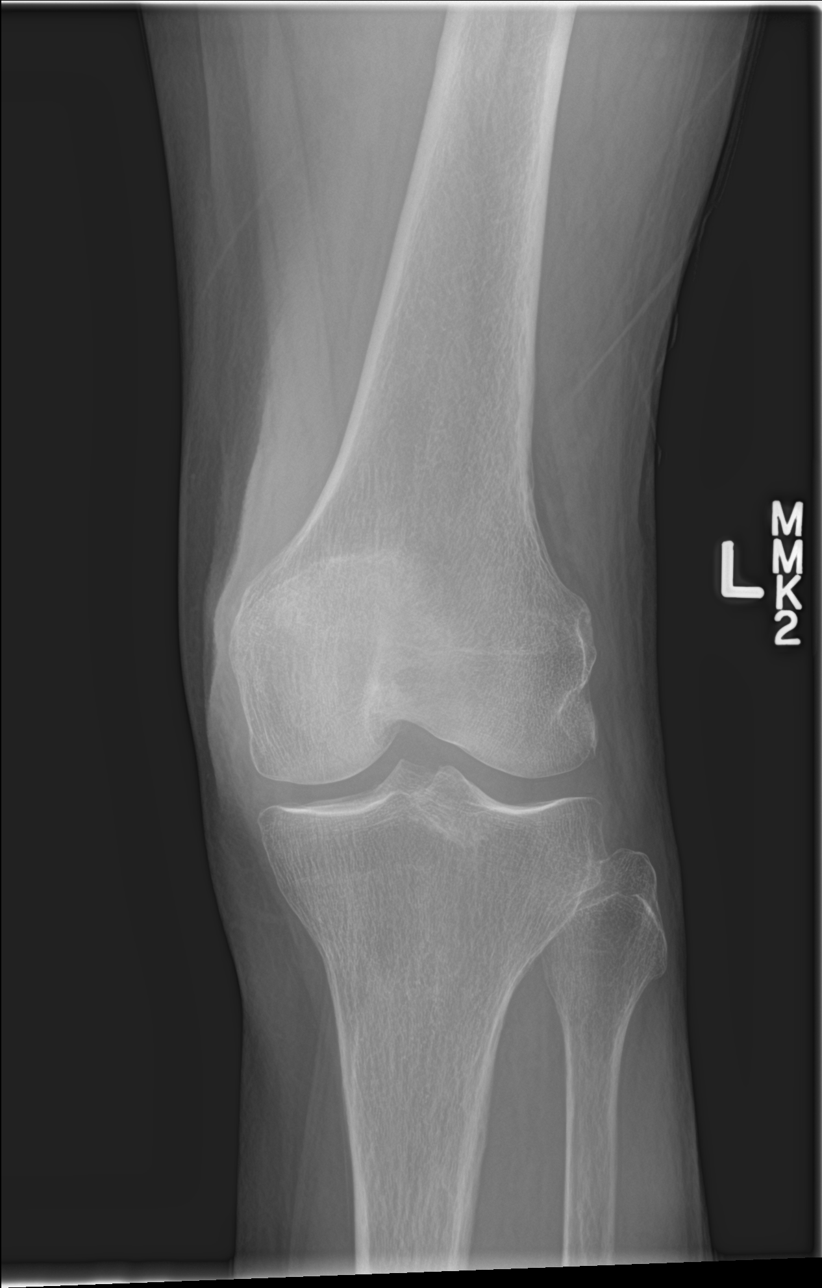

[knee lat]
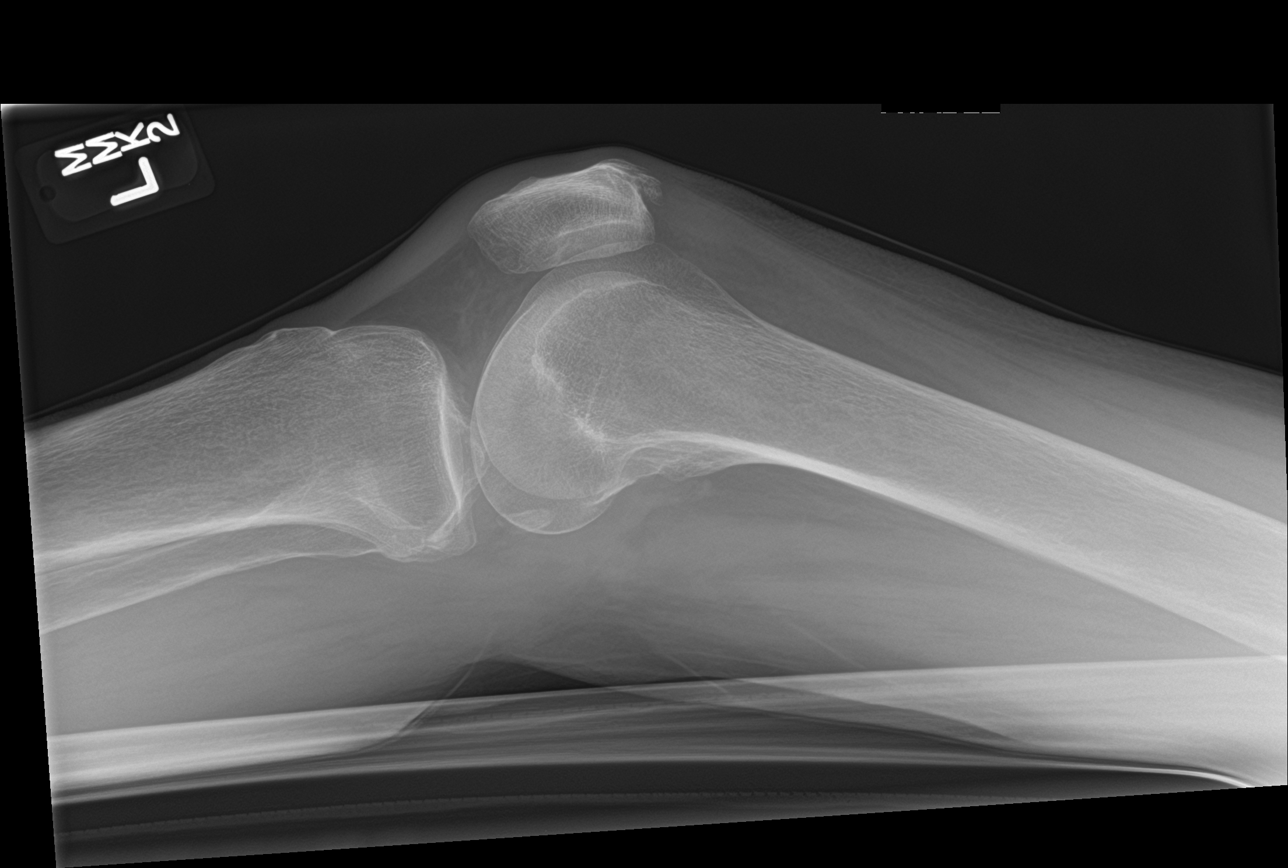

[knee obl (1 of 2)]
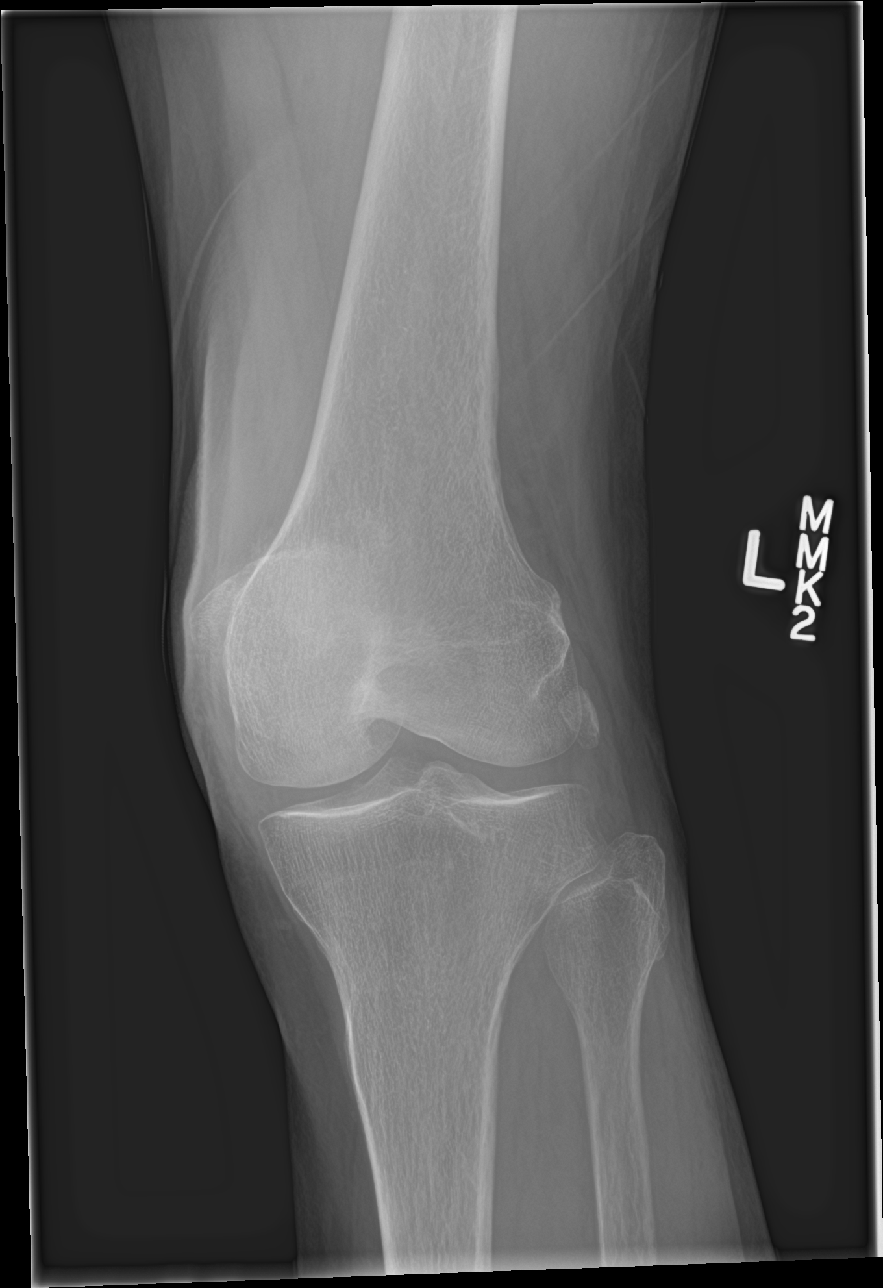

[knee obl (2 of 2)]
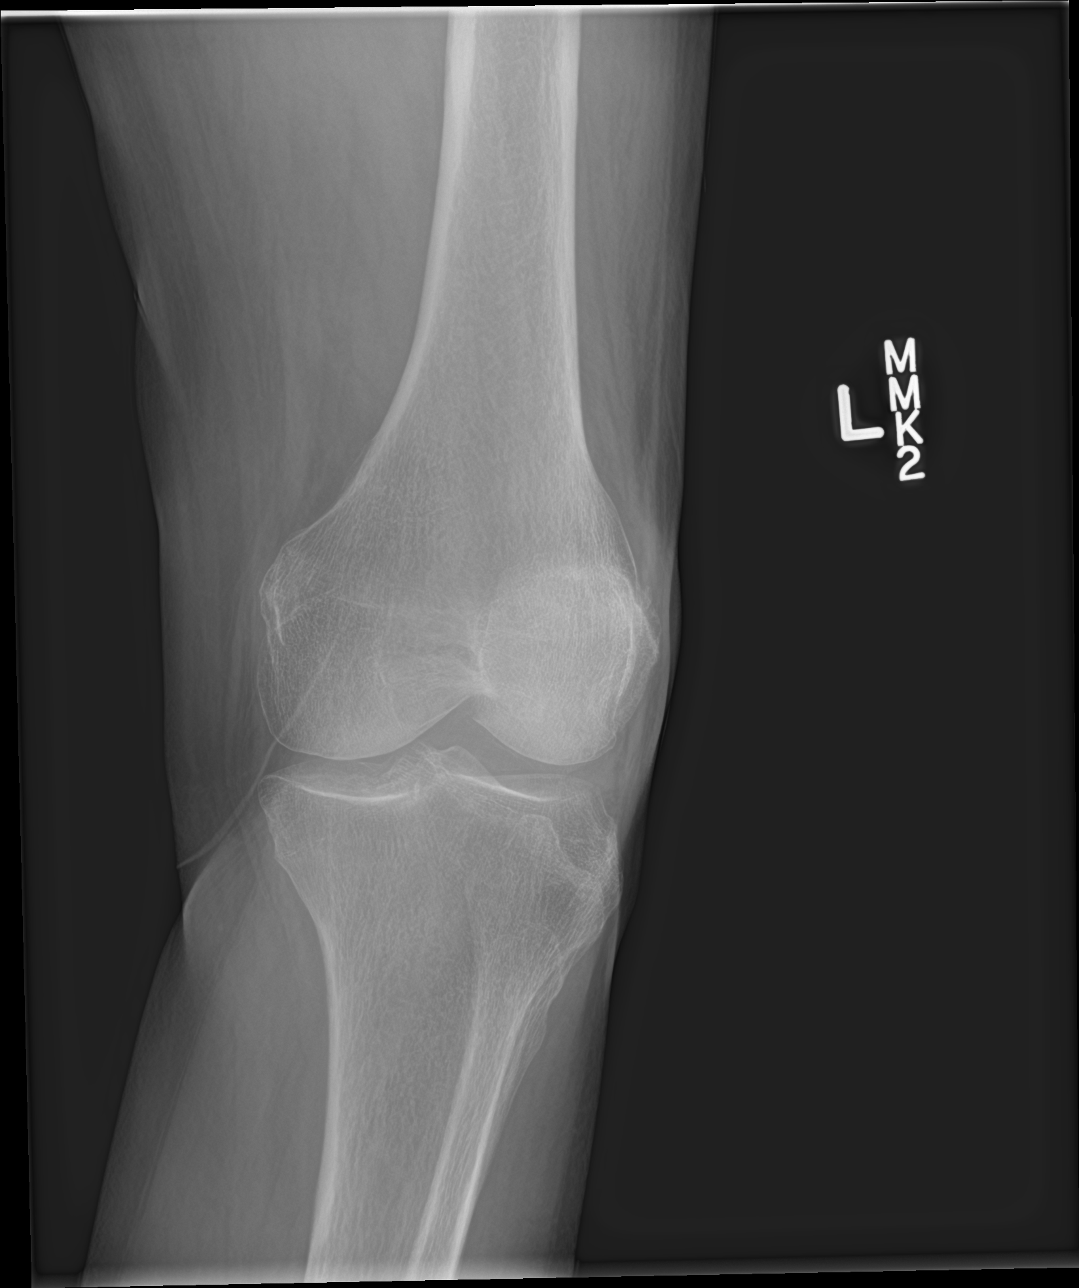

[4 of 4 positions shown; findings below may reference images not displayed]

FINDINGS: There is a small knee joint effusion. No evidence of fracture or
dislocation. No significant degenerative changes.
IMPRESSION: Small knee joint effusion.  Otherwise negative.

## 2020-07-30 IMAGING — CT CT HEAD W/O CM
4 series · 16 of 47 positions shown, 18 images · non-contrast
Comparison: 06/27/2018

CLINICAL DATA: Multiple falls up to 3 times a day since the
original fall 06/25/2018. Ataxia.

EXAM:
CT HEAD WITHOUT CONTRAST
TECHNIQUE: Contiguous axial images were obtained from the base of the skull
through the vertex without intravenous contrast.

[Series 3: head wo · axial · 0.41mm/px · z∈[-124,+1]mm · 7 of 35 slices shown, 9 images]
[im 5/35  brain]
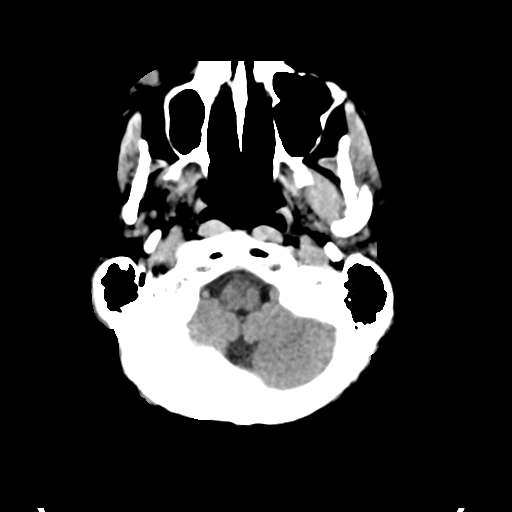
[im 5/35  bone]
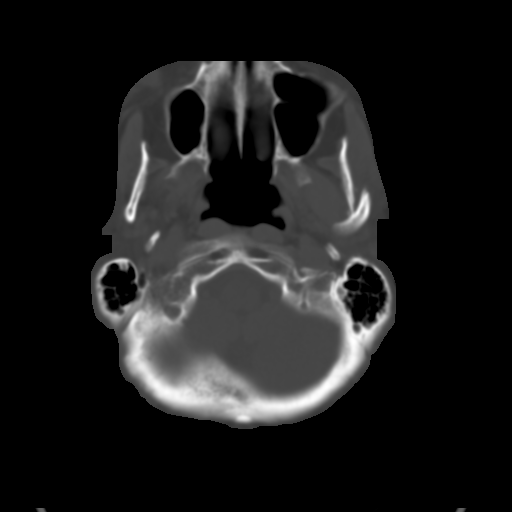
[im 9/35  brain]
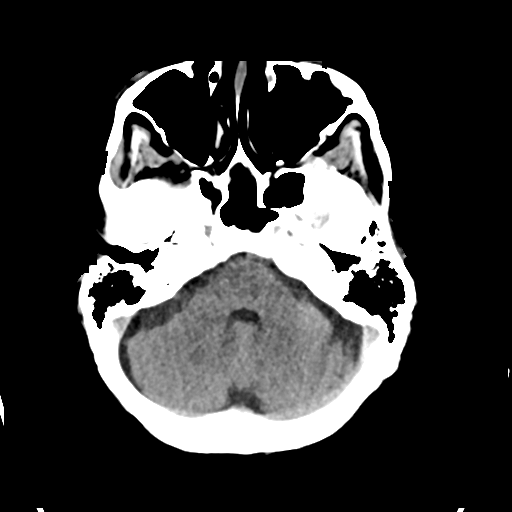
[im 13/35  brain]
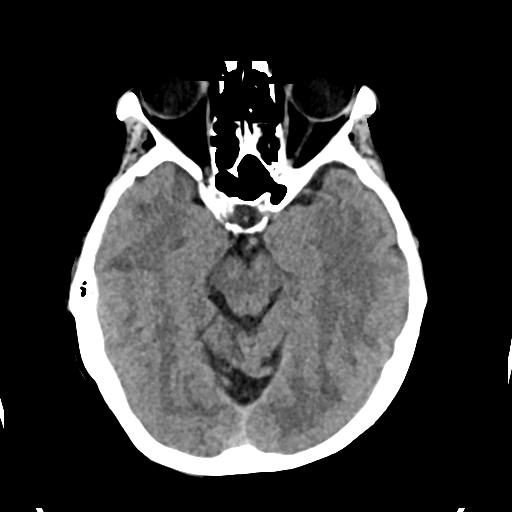
[im 18/35  brain]
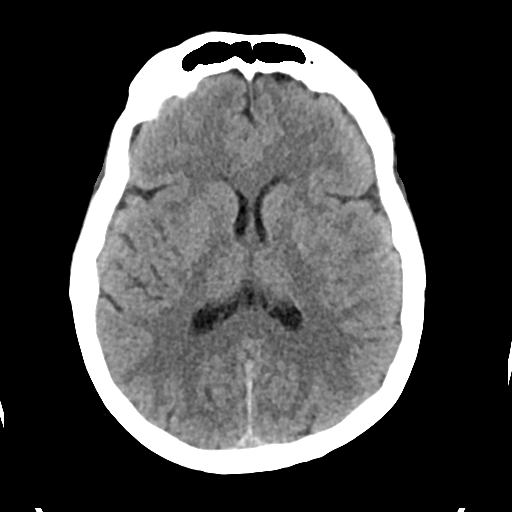
[im 22/35  brain]
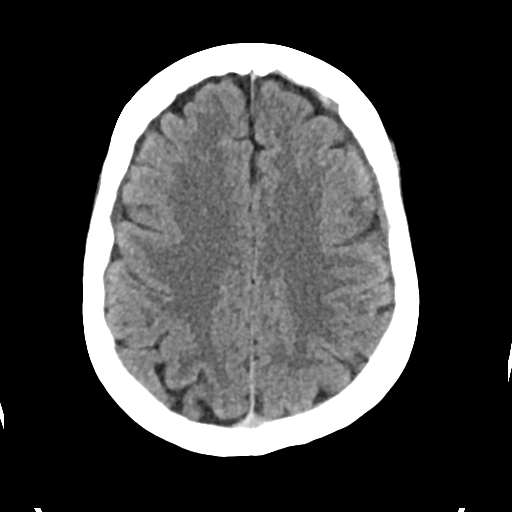
[im 22/35  bone]
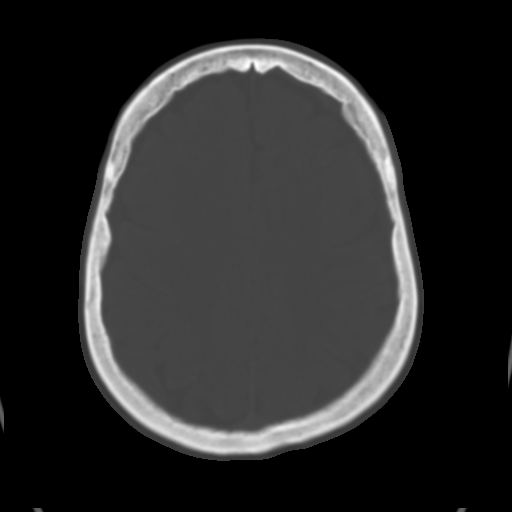
[im 26/35  brain]
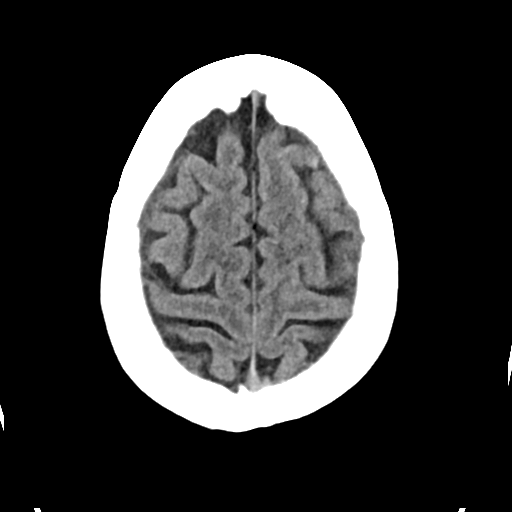
[im 30/35  brain]
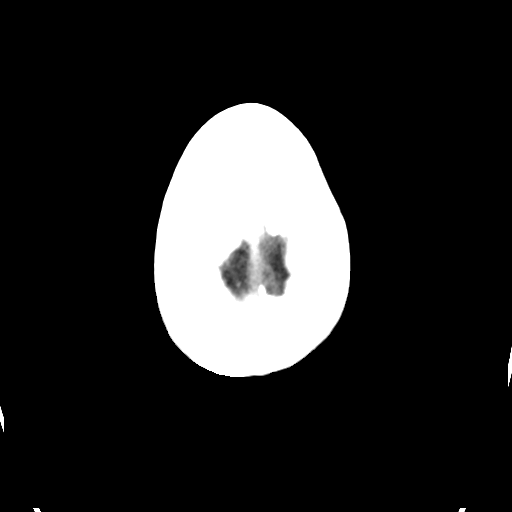

[Series 4: head bone · axial · 0.41mm/px · z∈[-128,-94]mm · 3 of 87 slices shown]
[im 9/87  bone]
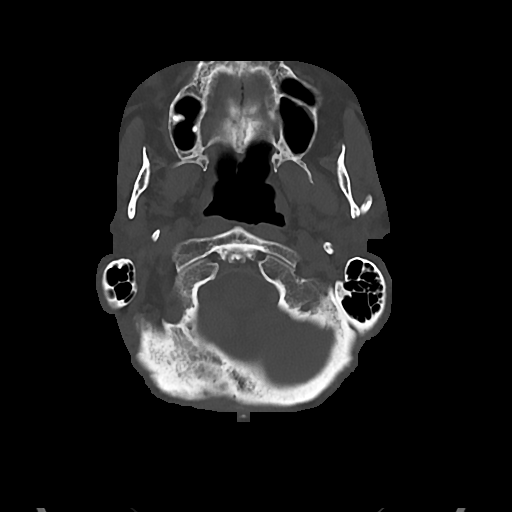
[im 18/87  bone]
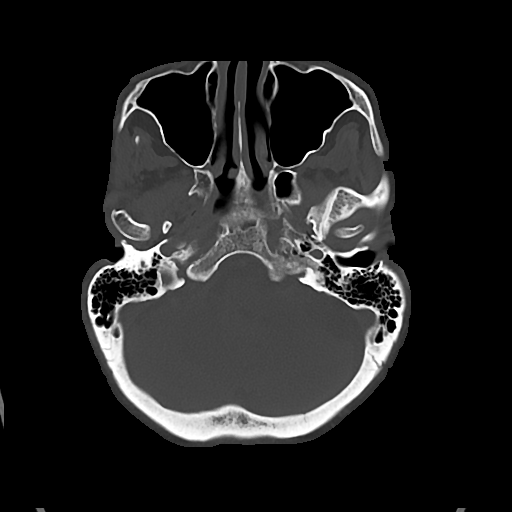
[im 26/87  bone]
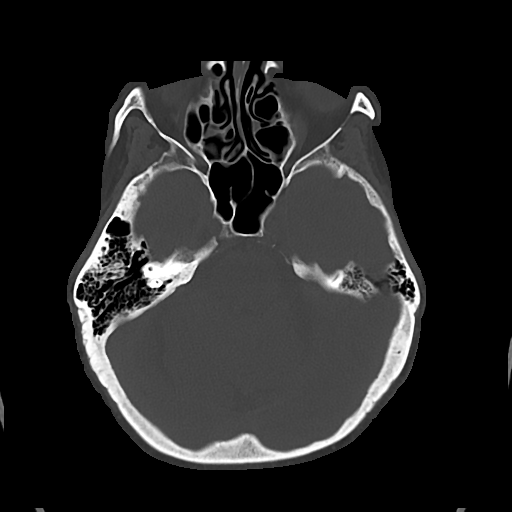

[Series 5: cor soft · coronal · 0.34mm/px · 3 of 69 slices shown]
[im 23/69  brain]
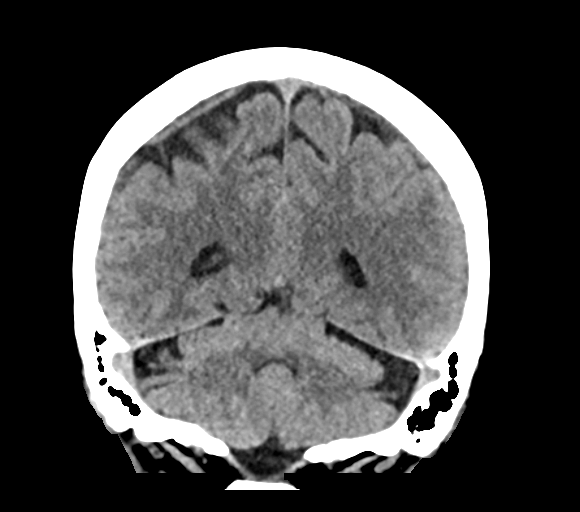
[im 31/69  brain]
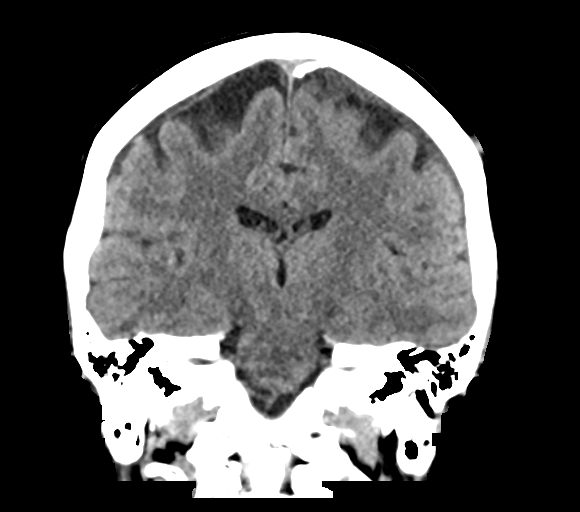
[im 38/69  brain]
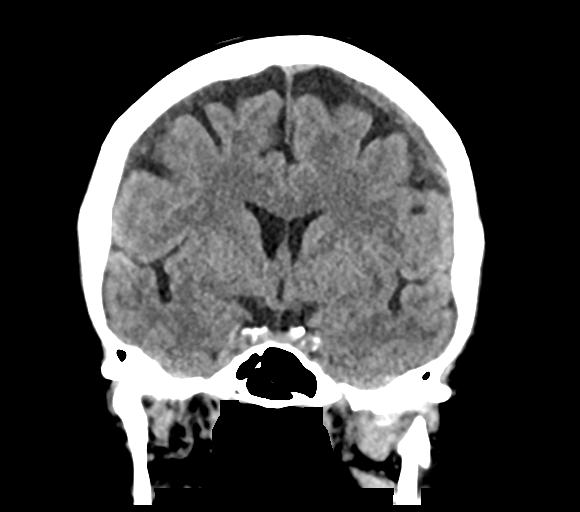

[Series 6: sag soft · sagittal · 0.34mm/px · 3 of 61 slices shown]
[im 21/61  brain]
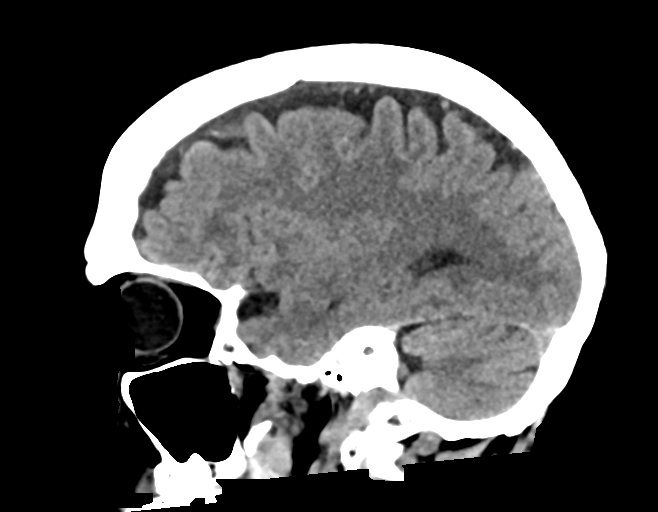
[im 31/61  brain]
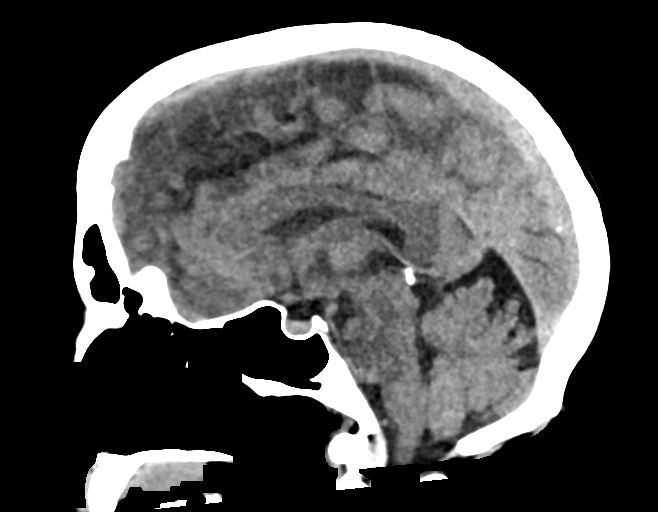
[im 41/61  brain]
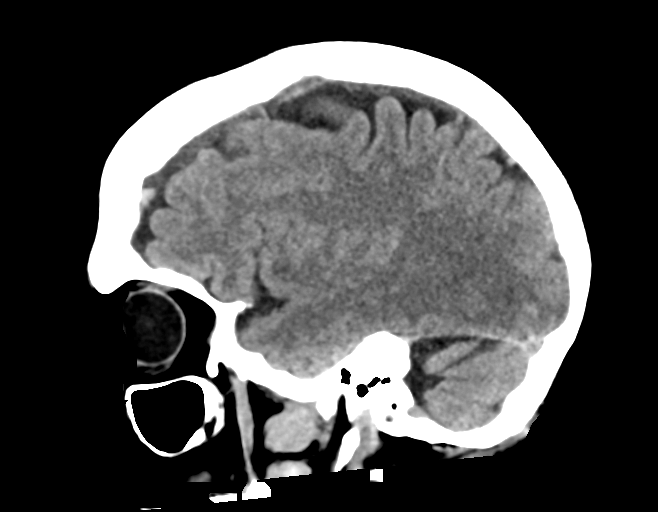

[16 of 47 positions shown; findings below may reference images not displayed]

FINDINGS: Brain: Late acute to subacute left-sided subdural hematoma measuring
up to 5 mm in thickness is noted. This is a new finding since prior.
No hydrocephalus. Chronic small vessel ischemic disease of
periventricular white matter. No midline shift. No effacement basal
cisterns. No large vascular territory infarct. Intra-axial mass.

Vascular: Atherosclerosis at the skull base. Hyperdense vessel sign.

Skull: Bilateral nasal bone fractures are redemonstrated. No acute
skull fracture.

Sinuses/Orbits: Intact orbits and globes. Bilateral lens
replacements.

Other: Clear bilateral mastoids without mastoid effusion.
IMPRESSION: 1. New since prior exam is a late acute to subacute left sided
subdural hematoma measuring up to 5 mm in thickness. No midline
shift. These results were called by telephone at the time of
interpretation on 07/02/2018 at [DATE] to Dr. ZHAO PEACH , who
verbally acknowledged these results.
2. Chronic small vessel ischemic disease of periventricular white
matter and cerebral atrophy is noted.
3. Bilateral subacute to chronic nasal bone fractures.

## 2020-07-31 IMAGING — CT CT HEAD W/O CM
4 series · 17 of 47 positions shown, 19 images · non-contrast
Comparison: Head CT dated 07/02/2018

CLINICAL DATA: 67-year-old female with subdural hemorrhage
follow-up exam.

EXAM:
CT HEAD WITHOUT CONTRAST
TECHNIQUE: Contiguous axial images were obtained from the base of the skull
through the vertex without intravenous contrast.

[Series 3: head wo · axial · 0.43mm/px · z∈[-120,+4]mm · 7 of 35 slices shown, 9 images]
[im 5/35  brain]
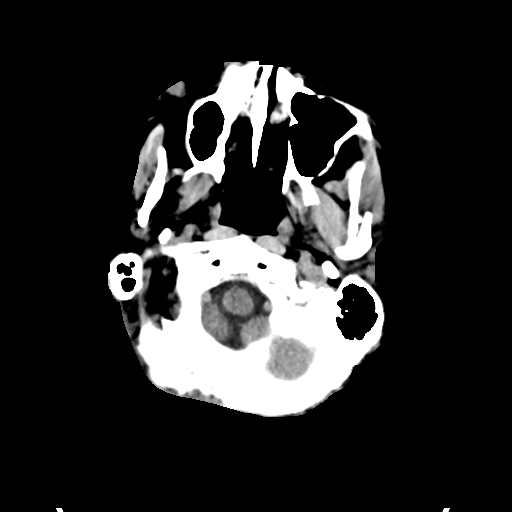
[im 5/35  bone]
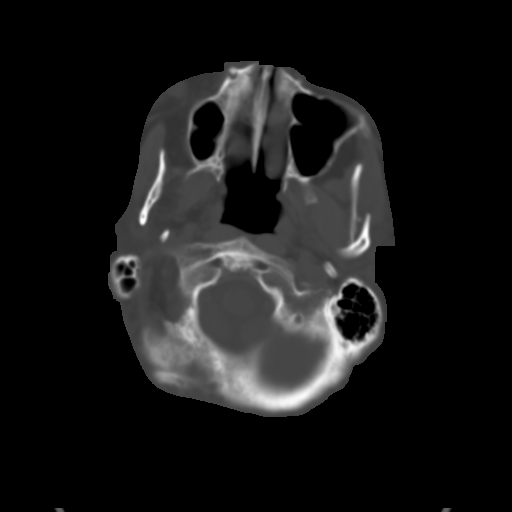
[im 9/35  brain]
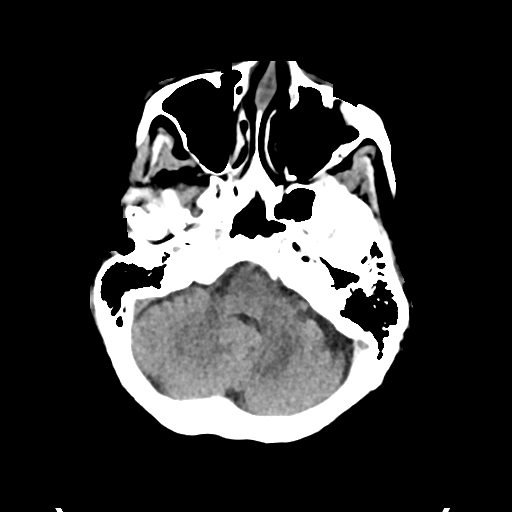
[im 13/35  brain]
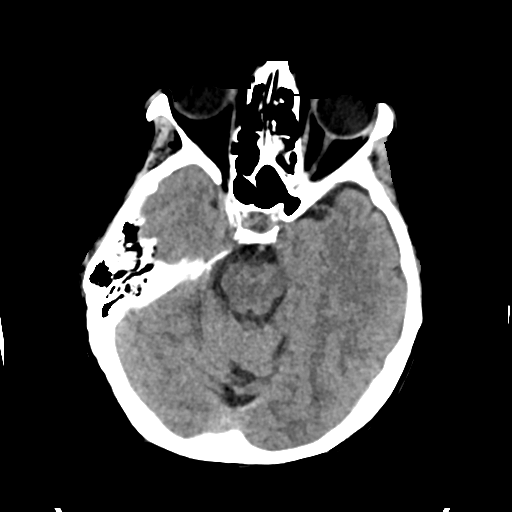
[im 18/35  brain]
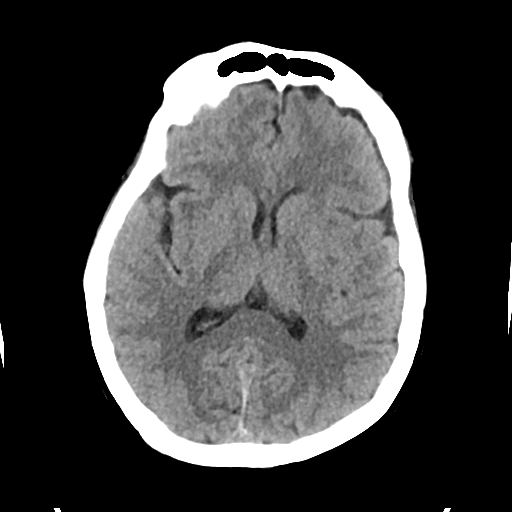
[im 22/35  brain]
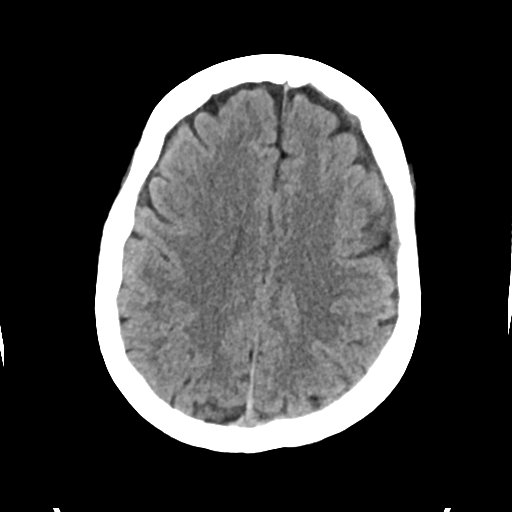
[im 22/35  bone]
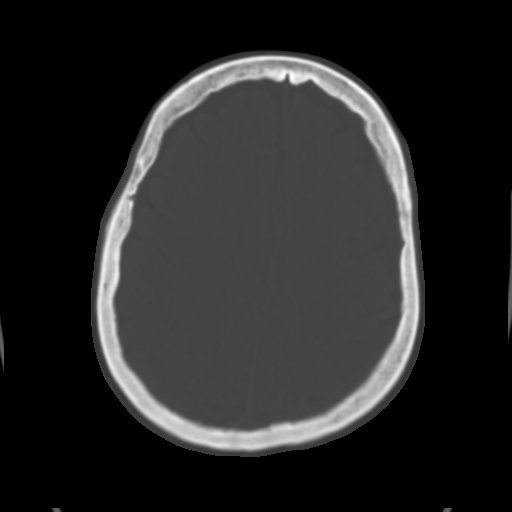
[im 26/35  brain]
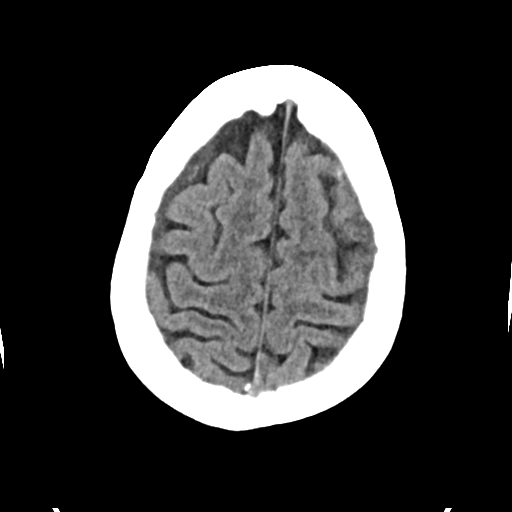
[im 30/35  brain]
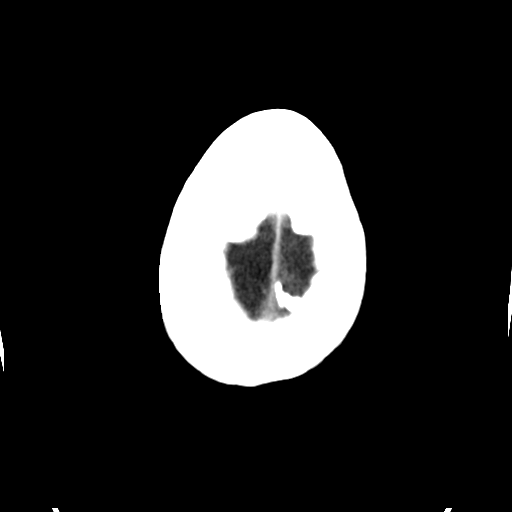

[Series 4: head bone · axial · 0.43mm/px · z∈[-124,-64]mm · 4 of 86 slices shown]
[im 9/86  bone]
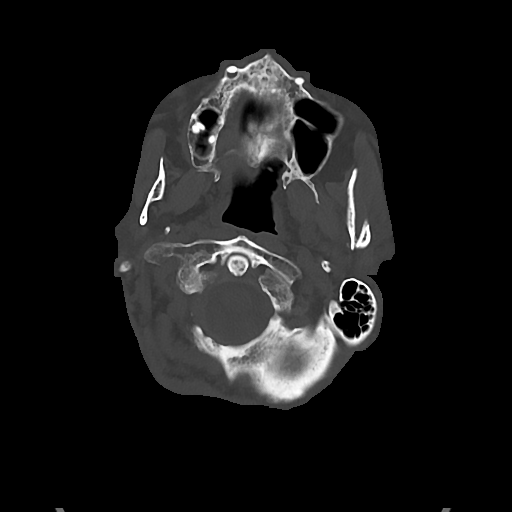
[im 18/86  bone]
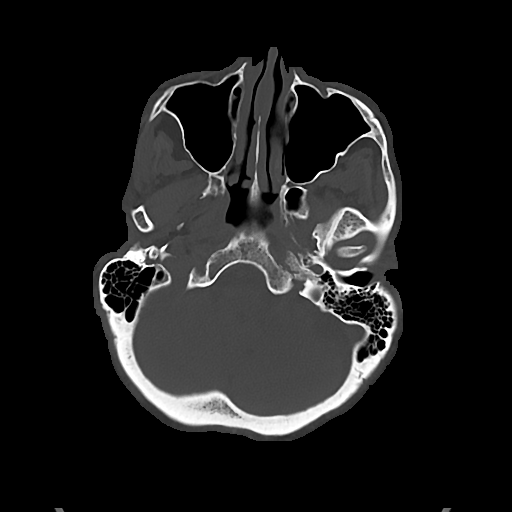
[im 26/86  bone]
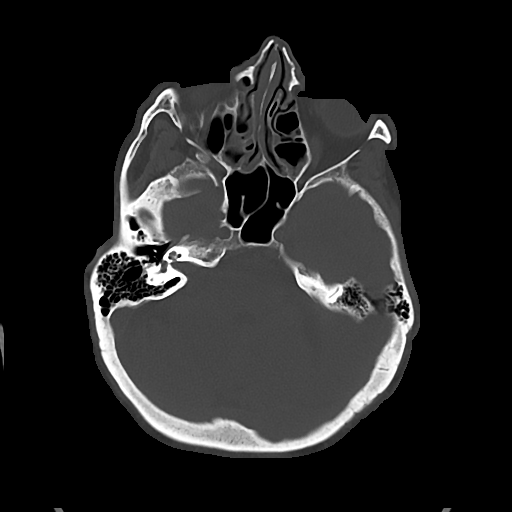
[im 39/86  bone]
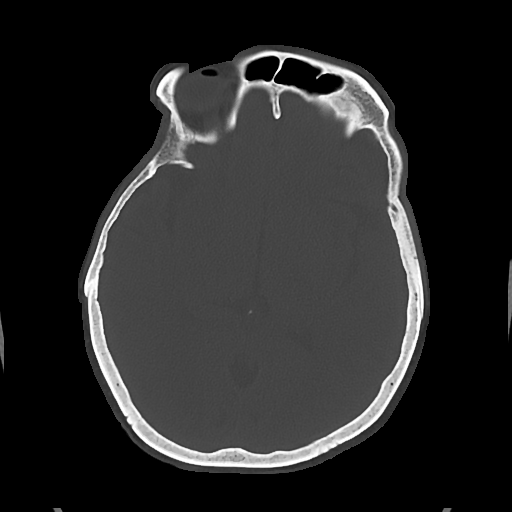

[Series 5: cor soft · coronal · 0.33mm/px · 3 of 69 slices shown]
[im 23/69  brain]
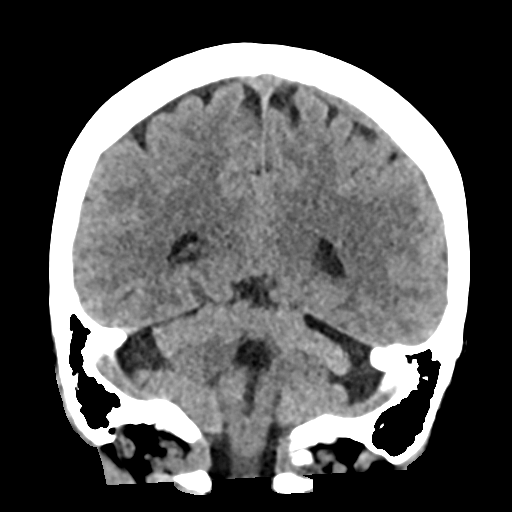
[im 31/69  brain]
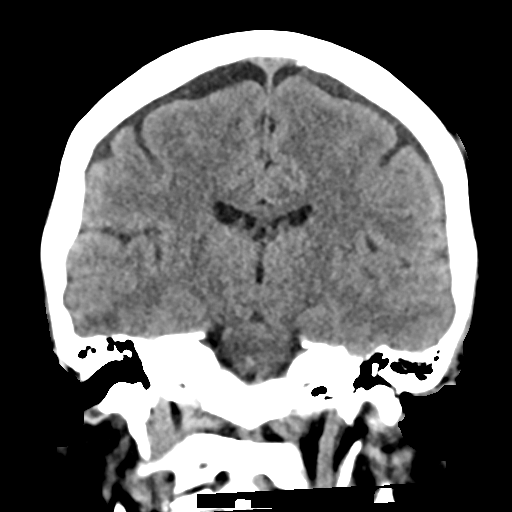
[im 38/69  brain]
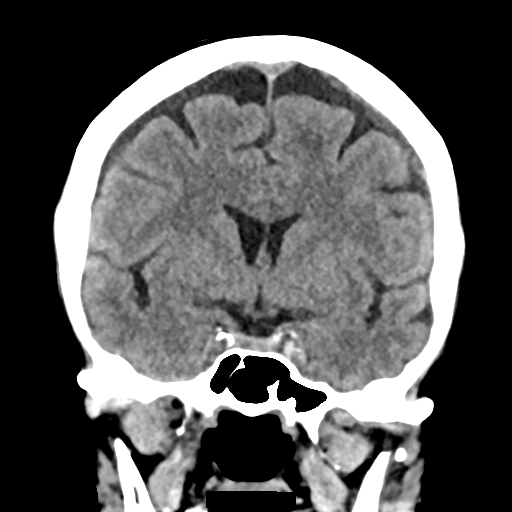

[Series 6: sag soft · sagittal · 0.33mm/px · 3 of 57 slices shown]
[im 19/57  brain]
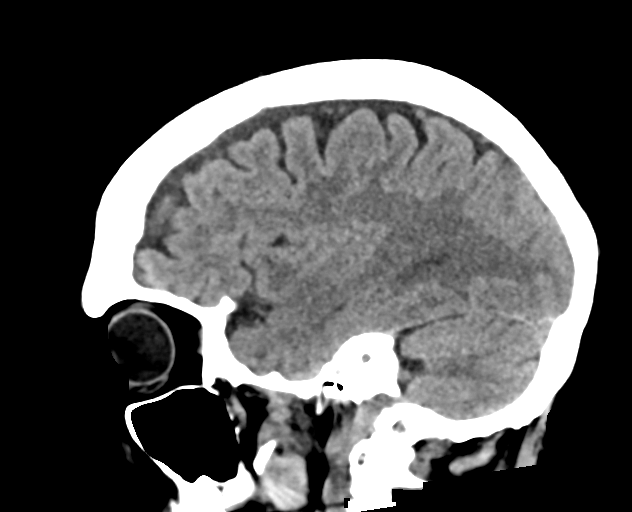
[im 29/57  brain]
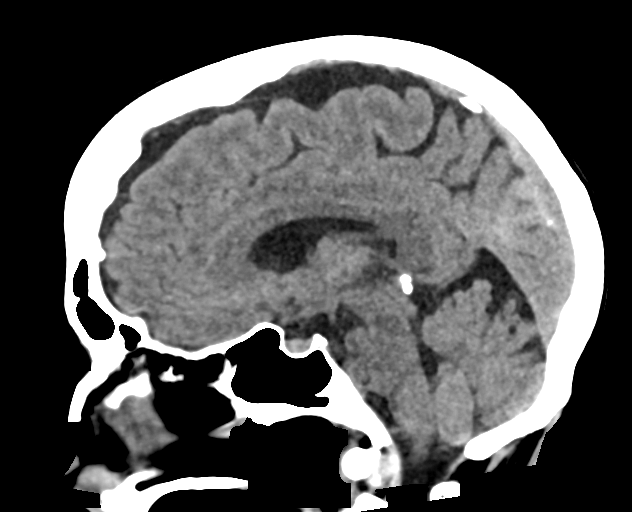
[im 38/57  brain]
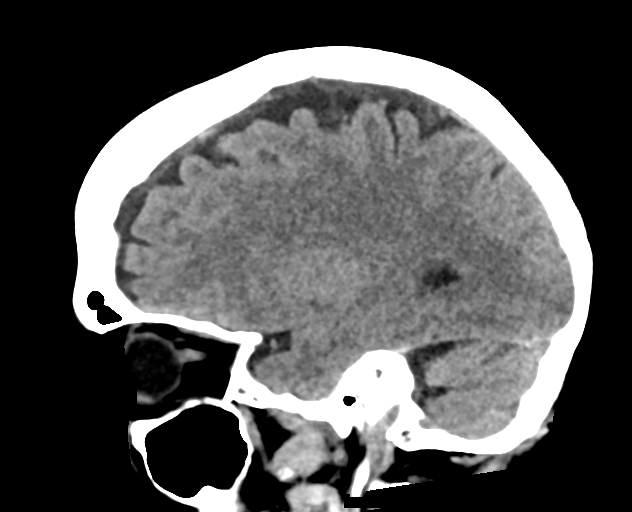

[17 of 47 positions shown; findings below may reference images not displayed]

FINDINGS: Brain: The previously seen left subdural hemorrhage appears similar
in size to the prior CT although slightly less conspicuous. No new
hemorrhage. Mild diffuse cortical atrophy and chronic microvascular
ischemic changes. No mass effect or midline shift.

Vascular: No hyperdense vessel or unexpected calcification.

Skull: Normal. Negative for fracture or focal lesion.

Sinuses/Orbits: No acute finding.

Other: None
IMPRESSION: No significant interval change in the size of left-sided subdural
hemorrhage. No new hemorrhage.

## 2020-08-07 DIAGNOSIS — Z4789 Encounter for other orthopedic aftercare: Secondary | ICD-10-CM | POA: Diagnosis not present

## 2020-08-09 ENCOUNTER — Inpatient Hospital Stay (HOSPITAL_COMMUNITY)
Admission: EM | Admit: 2020-08-09 | Discharge: 2020-08-15 | DRG: 522 | Disposition: A | Discharge status: Skilled Nursing Facility | Payer: Medicare Other | Attending: Internal Medicine | Admitting: Internal Medicine

## 2020-08-09 ENCOUNTER — Other Ambulatory Visit: Payer: Self-pay

## 2020-08-09 ENCOUNTER — Encounter (HOSPITAL_COMMUNITY): Payer: Self-pay

## 2020-08-09 ENCOUNTER — Emergency Department (HOSPITAL_COMMUNITY): Payer: Medicare Other

## 2020-08-09 DIAGNOSIS — Y92230 Patient room in hospital as the place of occurrence of the external cause: Secondary | ICD-10-CM | POA: Diagnosis not present

## 2020-08-09 DIAGNOSIS — G43909 Migraine, unspecified, not intractable, without status migrainosus: Secondary | ICD-10-CM | POA: Diagnosis not present

## 2020-08-09 DIAGNOSIS — M2550 Pain in unspecified joint: Secondary | ICD-10-CM | POA: Diagnosis not present

## 2020-08-09 DIAGNOSIS — W19XXXA Unspecified fall, initial encounter: Secondary | ICD-10-CM

## 2020-08-09 DIAGNOSIS — R519 Headache, unspecified: Secondary | ICD-10-CM | POA: Diagnosis present

## 2020-08-09 DIAGNOSIS — S72041A Displaced fracture of base of neck of right femur, initial encounter for closed fracture: Secondary | ICD-10-CM | POA: Diagnosis not present

## 2020-08-09 DIAGNOSIS — R0600 Dyspnea, unspecified: Secondary | ICD-10-CM

## 2020-08-09 DIAGNOSIS — Z20822 Contact with and (suspected) exposure to covid-19: Secondary | ICD-10-CM | POA: Diagnosis not present

## 2020-08-09 DIAGNOSIS — Y92009 Unspecified place in unspecified non-institutional (private) residence as the place of occurrence of the external cause: Secondary | ICD-10-CM

## 2020-08-09 DIAGNOSIS — R6889 Other general symptoms and signs: Secondary | ICD-10-CM | POA: Diagnosis not present

## 2020-08-09 DIAGNOSIS — S72001A Fracture of unspecified part of neck of right femur, initial encounter for closed fracture: Secondary | ICD-10-CM

## 2020-08-09 DIAGNOSIS — S72091A Other fracture of head and neck of right femur, initial encounter for closed fracture: Secondary | ICD-10-CM

## 2020-08-09 DIAGNOSIS — M6281 Muscle weakness (generalized): Secondary | ICD-10-CM | POA: Diagnosis not present

## 2020-08-09 DIAGNOSIS — M810 Age-related osteoporosis without current pathological fracture: Secondary | ICD-10-CM | POA: Diagnosis present

## 2020-08-09 DIAGNOSIS — R2681 Unsteadiness on feet: Secondary | ICD-10-CM | POA: Diagnosis not present

## 2020-08-09 DIAGNOSIS — K219 Gastro-esophageal reflux disease without esophagitis: Secondary | ICD-10-CM | POA: Diagnosis present

## 2020-08-09 DIAGNOSIS — W109XXA Fall (on) (from) unspecified stairs and steps, initial encounter: Secondary | ICD-10-CM | POA: Diagnosis present

## 2020-08-09 DIAGNOSIS — R296 Repeated falls: Secondary | ICD-10-CM

## 2020-08-09 DIAGNOSIS — F330 Major depressive disorder, recurrent, mild: Secondary | ICD-10-CM

## 2020-08-09 DIAGNOSIS — Z472 Encounter for removal of internal fixation device: Secondary | ICD-10-CM | POA: Diagnosis not present

## 2020-08-09 DIAGNOSIS — Z87440 Personal history of urinary (tract) infections: Secondary | ICD-10-CM

## 2020-08-09 DIAGNOSIS — Z79899 Other long term (current) drug therapy: Secondary | ICD-10-CM

## 2020-08-09 DIAGNOSIS — G894 Chronic pain syndrome: Secondary | ICD-10-CM | POA: Diagnosis not present

## 2020-08-09 DIAGNOSIS — Z419 Encounter for procedure for purposes other than remedying health state, unspecified: Secondary | ICD-10-CM

## 2020-08-09 DIAGNOSIS — M8580 Other specified disorders of bone density and structure, unspecified site: Secondary | ICD-10-CM | POA: Diagnosis not present

## 2020-08-09 DIAGNOSIS — T4145XA Adverse effect of unspecified anesthetic, initial encounter: Secondary | ICD-10-CM | POA: Diagnosis not present

## 2020-08-09 DIAGNOSIS — Z8249 Family history of ischemic heart disease and other diseases of the circulatory system: Secondary | ICD-10-CM

## 2020-08-09 DIAGNOSIS — Z9889 Other specified postprocedural states: Secondary | ICD-10-CM | POA: Diagnosis not present

## 2020-08-09 DIAGNOSIS — Z7401 Bed confinement status: Secondary | ICD-10-CM | POA: Diagnosis not present

## 2020-08-09 DIAGNOSIS — S7290XA Unspecified fracture of unspecified femur, initial encounter for closed fracture: Secondary | ICD-10-CM | POA: Diagnosis present

## 2020-08-09 DIAGNOSIS — E785 Hyperlipidemia, unspecified: Secondary | ICD-10-CM | POA: Diagnosis present

## 2020-08-09 DIAGNOSIS — I952 Hypotension due to drugs: Secondary | ICD-10-CM | POA: Diagnosis not present

## 2020-08-09 DIAGNOSIS — E876 Hypokalemia: Secondary | ICD-10-CM | POA: Diagnosis not present

## 2020-08-09 DIAGNOSIS — M159 Polyosteoarthritis, unspecified: Secondary | ICD-10-CM | POA: Diagnosis present

## 2020-08-09 DIAGNOSIS — S7291XA Unspecified fracture of right femur, initial encounter for closed fracture: Secondary | ICD-10-CM | POA: Diagnosis present

## 2020-08-09 DIAGNOSIS — I428 Other cardiomyopathies: Secondary | ICD-10-CM | POA: Diagnosis not present

## 2020-08-09 DIAGNOSIS — F17210 Nicotine dependence, cigarettes, uncomplicated: Secondary | ICD-10-CM | POA: Diagnosis not present

## 2020-08-09 DIAGNOSIS — M51369 Other intervertebral disc degeneration, lumbar region without mention of lumbar back pain or lower extremity pain: Secondary | ICD-10-CM | POA: Diagnosis present

## 2020-08-09 DIAGNOSIS — M549 Dorsalgia, unspecified: Secondary | ICD-10-CM | POA: Diagnosis present

## 2020-08-09 DIAGNOSIS — R509 Fever, unspecified: Secondary | ICD-10-CM | POA: Diagnosis not present

## 2020-08-09 DIAGNOSIS — M25572 Pain in left ankle and joints of left foot: Secondary | ICD-10-CM | POA: Diagnosis not present

## 2020-08-09 DIAGNOSIS — M25551 Pain in right hip: Secondary | ICD-10-CM | POA: Diagnosis not present

## 2020-08-09 DIAGNOSIS — M5030 Other cervical disc degeneration, unspecified cervical region: Secondary | ICD-10-CM | POA: Diagnosis not present

## 2020-08-09 DIAGNOSIS — F32A Depression, unspecified: Secondary | ICD-10-CM | POA: Diagnosis not present

## 2020-08-09 DIAGNOSIS — I2510 Atherosclerotic heart disease of native coronary artery without angina pectoris: Secondary | ICD-10-CM | POA: Diagnosis present

## 2020-08-09 DIAGNOSIS — Z8673 Personal history of transient ischemic attack (TIA), and cerebral infarction without residual deficits: Secondary | ICD-10-CM

## 2020-08-09 DIAGNOSIS — S72031A Displaced midcervical fracture of right femur, initial encounter for closed fracture: Principal | ICD-10-CM | POA: Diagnosis present

## 2020-08-09 DIAGNOSIS — K5790 Diverticulosis of intestine, part unspecified, without perforation or abscess without bleeding: Secondary | ICD-10-CM | POA: Diagnosis present

## 2020-08-09 DIAGNOSIS — S72041D Displaced fracture of base of neck of right femur, subsequent encounter for closed fracture with routine healing: Secondary | ICD-10-CM | POA: Diagnosis not present

## 2020-08-09 DIAGNOSIS — M5136 Other intervertebral disc degeneration, lumbar region: Secondary | ICD-10-CM | POA: Diagnosis present

## 2020-08-09 DIAGNOSIS — F419 Anxiety disorder, unspecified: Secondary | ICD-10-CM | POA: Diagnosis present

## 2020-08-09 DIAGNOSIS — Z743 Need for continuous supervision: Secondary | ICD-10-CM | POA: Diagnosis not present

## 2020-08-09 DIAGNOSIS — S72011A Unspecified intracapsular fracture of right femur, initial encounter for closed fracture: Secondary | ICD-10-CM | POA: Diagnosis not present

## 2020-08-09 DIAGNOSIS — Z885 Allergy status to narcotic agent status: Secondary | ICD-10-CM

## 2020-08-09 DIAGNOSIS — Z471 Aftercare following joint replacement surgery: Secondary | ICD-10-CM | POA: Diagnosis not present

## 2020-08-09 DIAGNOSIS — S8291XD Unspecified fracture of right lower leg, subsequent encounter for closed fracture with routine healing: Secondary | ICD-10-CM | POA: Diagnosis not present

## 2020-08-09 DIAGNOSIS — Z90710 Acquired absence of both cervix and uterus: Secondary | ICD-10-CM

## 2020-08-09 DIAGNOSIS — R2689 Other abnormalities of gait and mobility: Secondary | ICD-10-CM | POA: Diagnosis not present

## 2020-08-09 DIAGNOSIS — Z85828 Personal history of other malignant neoplasm of skin: Secondary | ICD-10-CM

## 2020-08-09 DIAGNOSIS — R0902 Hypoxemia: Secondary | ICD-10-CM | POA: Diagnosis not present

## 2020-08-09 DIAGNOSIS — S8991XA Unspecified injury of right lower leg, initial encounter: Secondary | ICD-10-CM | POA: Diagnosis not present

## 2020-08-09 DIAGNOSIS — Z880 Allergy status to penicillin: Secondary | ICD-10-CM

## 2020-08-09 DIAGNOSIS — I499 Cardiac arrhythmia, unspecified: Secondary | ICD-10-CM | POA: Diagnosis not present

## 2020-08-09 DIAGNOSIS — Z96643 Presence of artificial hip joint, bilateral: Secondary | ICD-10-CM | POA: Diagnosis not present

## 2020-08-09 DIAGNOSIS — Z96641 Presence of right artificial hip joint: Secondary | ICD-10-CM | POA: Diagnosis not present

## 2020-08-09 DIAGNOSIS — G8929 Other chronic pain: Secondary | ICD-10-CM | POA: Diagnosis present

## 2020-08-09 DIAGNOSIS — F329 Major depressive disorder, single episode, unspecified: Secondary | ICD-10-CM | POA: Diagnosis present

## 2020-08-09 LAB — CBC WITH DIFFERENTIAL/PLATELET
Abs Immature Granulocytes: 0.02 10*3/uL (ref 0.00–0.07)
Basophils Absolute: 0 10*3/uL (ref 0.0–0.1)
Basophils Relative: 0 %
Eosinophils Absolute: 0 10*3/uL (ref 0.0–0.5)
Eosinophils Relative: 0 %
HCT: 39.6 % (ref 36.0–46.0)
Hemoglobin: 12.1 g/dL (ref 12.0–15.0)
Immature Granulocytes: 0 %
Lymphocytes Relative: 11 %
Lymphs Abs: 1 10*3/uL (ref 0.7–4.0)
MCH: 26.1 pg (ref 26.0–34.0)
MCHC: 30.6 g/dL (ref 30.0–36.0)
MCV: 85.3 fL (ref 80.0–100.0)
Monocytes Absolute: 0.5 10*3/uL (ref 0.1–1.0)
Monocytes Relative: 6 %
Neutro Abs: 7.5 10*3/uL (ref 1.7–7.7)
Neutrophils Relative %: 83 %
Platelets: 235 10*3/uL (ref 150–400)
RBC: 4.64 MIL/uL (ref 3.87–5.11)
RDW: 15.9 % — ABNORMAL HIGH (ref 11.5–15.5)
WBC: 9.1 10*3/uL (ref 4.0–10.5)
nRBC: 0 % (ref 0.0–0.2)

## 2020-08-09 LAB — BASIC METABOLIC PANEL
Anion gap: 6 (ref 5–15)
BUN: 12 mg/dL (ref 8–23)
CO2: 22 mmol/L (ref 22–32)
Calcium: 7.9 mg/dL — ABNORMAL LOW (ref 8.9–10.3)
Chloride: 111 mmol/L (ref 98–111)
Creatinine, Ser: 0.68 mg/dL (ref 0.44–1.00)
GFR, Estimated: >60 mL/min (ref >60)
Glucose, Bld: 85 mg/dL (ref 70–99)
Potassium: 4 mmol/L (ref 3.5–5.1)
Sodium: 139 mmol/L (ref 135–145)

## 2020-08-09 LAB — PROTIME-INR
INR: 1 (ref 0.8–1.2)
Prothrombin Time: 12.8 seconds (ref 11.4–15.2)

## 2020-08-09 LAB — RESP PANEL BY RT-PCR (FLU A&B, COVID) ARPGX2
Influenza A by PCR: NEGATIVE
Influenza B by PCR: NEGATIVE
SARS Coronavirus 2 by RT PCR: NEGATIVE

## 2020-08-09 MED ORDER — ONDANSETRON HCL 4 MG PO TABS: 4.0000 mg | ORAL_TABLET | Freq: Four times a day (QID) | ORAL | Status: DC | PRN

## 2020-08-09 MED ORDER — PANTOPRAZOLE SODIUM 40 MG PO TBEC
40.0000 mg | DELAYED_RELEASE_TABLET | Freq: Every day | ORAL | Status: DC
  Administered 2020-08-09 – 2020-08-14 (×4): 40 mg via ORAL
  Filled 2020-08-09 (×5): qty 1

## 2020-08-09 MED ORDER — GABAPENTIN 100 MG PO CAPS
200.0000 mg | ORAL_CAPSULE | Freq: Three times a day (TID) | ORAL | Status: DC
  Administered 2020-08-09 – 2020-08-14 (×12): 200 mg via ORAL
  Filled 2020-08-09 (×13): qty 2

## 2020-08-09 MED ORDER — HYDROMORPHONE HCL 1 MG/ML IJ SOLN
1.0000 mg | INTRAMUSCULAR | Status: DC | PRN
  Administered 2020-08-09 – 2020-08-10 (×4): 1 mg via INTRAVENOUS
  Filled 2020-08-09 (×4): qty 1

## 2020-08-09 MED ORDER — ACETAMINOPHEN 325 MG PO TABS
650.0000 mg | ORAL_TABLET | Freq: Four times a day (QID) | ORAL | Status: DC | PRN
  Administered 2020-08-10 – 2020-08-13 (×6): 650 mg via ORAL
  Filled 2020-08-09 (×5): qty 2

## 2020-08-09 MED ORDER — FENTANYL CITRATE (PF) 100 MCG/2ML IJ SOLN
50.0000 ug | INTRAMUSCULAR | Status: DC | PRN
  Administered 2020-08-09: 50 ug via INTRAVENOUS
  Filled 2020-08-09 (×2): qty 2

## 2020-08-09 MED ORDER — ONDANSETRON HCL 4 MG/2ML IJ SOLN: 4.0000 mg | Freq: Four times a day (QID) | INTRAMUSCULAR | Status: DC | PRN

## 2020-08-09 MED ORDER — TRAZODONE HCL 100 MG PO TABS
200.0000 mg | ORAL_TABLET | Freq: Every day | ORAL | Status: DC
  Administered 2020-08-09 – 2020-08-13 (×5): 200 mg via ORAL
  Filled 2020-08-09 (×6): qty 2

## 2020-08-09 MED ORDER — DEXTROSE-NACL 5-0.9 % IV SOLN: INTRAVENOUS | Status: DC

## 2020-08-09 MED ORDER — CLONAZEPAM 0.5 MG PO TABS
0.5000 mg | ORAL_TABLET | Freq: Three times a day (TID) | ORAL | Status: DC | PRN
  Administered 2020-08-09 – 2020-08-14 (×4): 0.5 mg via ORAL
  Filled 2020-08-09 (×4): qty 1

## 2020-08-09 MED ORDER — FENTANYL CITRATE (PF) 100 MCG/2ML IJ SOLN: 12.5000 ug | INTRAMUSCULAR | Status: DC | PRN

## 2020-08-09 MED ORDER — FLUOXETINE HCL 20 MG PO CAPS
80.0000 mg | ORAL_CAPSULE | Freq: Every day | ORAL | Status: DC
Start: 2020-08-09 — End: 2020-08-15
  Administered 2020-08-09 – 2020-08-14 (×5): 80 mg via ORAL
  Filled 2020-08-09 (×5): qty 4

## 2020-08-09 MED ORDER — ACETAMINOPHEN 650 MG RE SUPP: 650.0000 mg | Freq: Four times a day (QID) | RECTAL | Status: DC | PRN

## 2020-08-09 MED ORDER — BISACODYL 5 MG PO TBEC
5.0000 mg | DELAYED_RELEASE_TABLET | Freq: Every day | ORAL | Status: DC | PRN
  Administered 2020-08-14: 5 mg via ORAL
  Filled 2020-08-09: qty 1

## 2020-08-09 NOTE — H&P (View-Only) (Signed)
Chief Complaint: Status post fall with right hip fracture History: Dorothy White is a 70 y.o. female with medical history significant of osteoarthritis and recurrent falls, history of left femur fracture with open reduction back in September 2021, GERD, basal cell carcinoma, anxiety disorder, recurrent constipation, diverticular disease, TIAs, hyperlipidemia, history of subdural hematoma and hemiplegic migraines who presented to the ER today after sustaining a mechanical fall at home.  Patient apparently tongue and slipped on the carpet and fell.  She sustained right hip pain and shortness of right lower extremity.  She was brought to the ER where she was found to have comminuted right femoral neck fracture.  Dr. Vanita Panda has spoken with Dr. Melina Schools who agrees to see patient.  Patient will have surgery on Saturday for bilateral one of his partners.  She has been admitted to the medical service for medical clearance.  At this point patient appears stable.  No new issues..  Review of systems: No recent fevers, chills. No nausea/vomiting. Positive history of anxiety, and diverticulosis. Prior history of TIAs.  Currently alert and oriented x3.  No mental status changes.  No loss of consciousness with recent fall.   Past Medical History:  Diagnosis Date  . Anemia    ?  Marland Kitchen Anxiety   . Arthritis    "everywhere" (04/25/2016)  . 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 Chronic back pain    "all over" (04/25/2016)  . Constipation   . Depression   . Diverticulitis   . GERD (gastroesophageal reflux disease)   . Head injury 2019   subdural hematoma  . Headache    "weekly" (04/25/2016)  . 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 History of blood transfusion   . HLD (hyperlipidemia)    hx (04/25/2016)  . 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 Mild CAD    a. 25% mLAD, otherwise no sig disease 01/2016 cath.  Marland Kitchen NICM (nonischemic  cardiomyopathy) ([place])    a. EF 40-45% by echo 02/8415 at time of complicated migraine/neuro sx, 55-65% at time of cath 01/2016  . Stroke [facility]) 06/2013   "mini" stroke , ?possibly hemaplegic migraine per pt    Allergies  Allergen Reactions  . Opana [Oxymorphone Hcl] Other (See Comments)    hallucinations  . Penicillins Hives    Has patient had a PCN reaction causing immediate rash, facial/tongue/throat swelling, SOB or lightheadedness with hypotension: Unknown Has patient had a PCN reaction causing severe rash involving mucus membranes or skin necrosis: No Has patient had a PCN reaction that required hospitalization No Has patient had a PCN reaction occurring within the last 10 years: No If all of the above answers are "NO", then may proceed with Cephalosporin use.     No current facility-administered medications on file prior to encounter.   Current Outpatient Medications on File Prior to Encounter  Medication Sig Dispense Refill  . FLUoxetine (PROZAC) 40 MG capsule Take 80 mg by mouth daily.  11  . gabapentin (NEURONTIN) 100 MG capsule Take 200 mg by mouth 3 (three) times daily.    Marland Kitchen omeprazole (PRILOSEC) 20 MG capsule Take 20 mg by mouth daily.    Marland Kitchen topiramate (TOPAMAX) 100 MG tablet Take 1 tablet (100 mg total) by mouth at bedtime. 90 tablet 3  . traZODone (DESYREL) 100 MG tablet Take 200 mg by mouth at bedtime.    . carbidopa-levodopa (SINEMET IR) 25-100 MG tablet  Take 1 tablet by mouth 3 (three) times daily. 7am/11am/4pm (Patient not taking: No sig reported) 270 tablet 1  . clonazePAM (KLONOPIN) 1 MG tablet Take 0.5 mg by mouth at bedtime as needed for anxiety.     . feeding supplement, ENSURE ENLIVE, (ENSURE ENLIVE) LIQD Take 237 mLs by mouth 3 (three) times daily between meals. (Patient not taking: Reported on 08/09/2020) 237 mL 12  . Magnesium 500 MG TABS Take 500 mg by mouth daily.  (Patient not taking: Reported on 08/09/2020)    . mometasone (NASONEX) 50 MCG/ACT nasal spray  Place 2 sprays into the nose daily. (Patient not taking: Reported on 08/09/2020) 17 g 0  . Multiple Vitamin (MULTIVITAMIN WITH MINERALS) TABS tablet Take 1 tablet by mouth daily. (Patient not taking: Reported on 08/09/2020) 30 tablet 0  . ondansetron (ZOFRAN) 8 MG tablet Take 1 tablet (8 mg total) by mouth every 8 (eight) hours as needed for nausea or vomiting. (Patient not taking: Reported on 08/09/2020) 30 tablet 0  . trazodone (DESYREL) 300 MG tablet Take 1 tablet (300 mg total) by mouth at bedtime as needed for sleep. (Patient not taking: Reported on 08/09/2020) 30 tablet 0  . vitamin B-12 (CYANOCOBALAMIN) 1000 MCG tablet Take 1 tablet (1,000 mcg total) by mouth daily. (Patient not taking: Reported on 08/09/2020) 30 tablet 0    Physical Exam: Vitals:   08/09/20 1600 08/09/20 1615  BP: 99/70 123/66  Pulse: 63 65  Resp: 15 16  Temp:    SpO2: 96% 96%   There is no height or weight on file to calculate BMI. Patient is alert and oriented x3. No shortness of breath.  Lungs clear to auscultation bilaterally. Cardiac: Regular rate and rhythm.  No rubs gallops murmurs. Abdomen is soft and nontender.  No incontinence of bowel and bladder, no rebound tenderness. Full range of motion of the upper extremity with no gross crepitus deformity or pain. Lower extremity: 5/5 motor strength in the EHL/tibialis anterior/gastrocnemius.  Normal sensation to light touch in the lower extremity.  Significant pain of the right groin.  Pain intensified with gentle palpation or range of motion.  The extremity itself is shortened and slightly externally rotated.  No knee pain or swelling noted.  Left lower extremity is asymptomatic. 2+ dorsalis pedis/posterior tibialis pulses bilaterally.  Compartments are soft and nontender.  Image: DG Hip Unilat With Pelvis 2-3 Views Right  Result Date: 08/09/2020 CLINICAL DATA:  70 year old female with right hip pain after falling EXAM: DG HIP (WITH OR WITHOUT PELVIS) 2-3V RIGHT  COMPARISON:  None. FINDINGS: Acute fracture through the right femoral neck consistent with a transcervical fracture. The femoral head remains located. Incompletely imaged surgical changes of left total hip arthroplasty. Bony pelvis is intact. Left greater than right facet arthropathy at L4-L5 and L5-S1. IMPRESSION: Acute right transcervical femoral neck fracture. Electronically Signed   By: Jacqulynn Cadet M.D.   On: 08/09/2020 16:47    A/P: Dorothy White is a very pleasant 70 year old woman who unfortunately had a fall and now has a displaced right femoral neck fracture.  Patient is neurovascularly intact and has no signs or symptoms of compartment syndrome.  This is a closed injury.  Plan on admission to the hospitalist per protocol.  I have spoken with Dr. Lyla Glassing my partner and he will move forward with definitive fracture fixation most likely Saturday morning.  If there is any concerns or questions please not hesitate to contact my office at [phone]

## 2020-08-09 NOTE — ED Provider Notes (Signed)
[facility] DEPT Provider Note   CSN: [id number] Arrival date & time: 08/09/20  1415     History No chief complaint on file.   Dorothy White is a 70 y.o. female.  HPI Patient presents via EMS after mechanical fall, now with concern of pain in her right hip. Patient recalls falling, states that she was walking up a small landing, when she fell. On her right side, since that time has been nonambulatory, with severe pain focally in the right hip. No head trauma, no loss of consciousness, no pain in her head, neck, chest, abdomen. She has had a transient relief with 250 mcg of fentanyl provided in route by EMS providers. Patient notes a history of bilateral hip replacements, most recently 2019 with Dr. Alma Friendly.    Past Medical History:  Diagnosis Date  . Anemia    ?  Marland Kitchen Anxiety   . Arthritis    "everywhere" (04/25/2016)  . 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 Chronic back pain    "all over" (04/25/2016)  . Constipation   . Depression   . Diverticulitis   . GERD (gastroesophageal reflux disease)   . Head injury 2019   subdural hematoma  . Headache    "weekly" (04/25/2016)  . 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 History of blood transfusion   . HLD (hyperlipidemia)    hx (04/25/2016)  . 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 Mild CAD    a. 25% mLAD, otherwise no sig disease 01/2016 cath.  Marland Kitchen NICM (nonischemic cardiomyopathy) ([place])    a. EF 40-45% by echo 123456 at time of complicated migraine/neuro sx, 55-65% at time of cath 01/2016  . Stroke [facility]) 06/2013   "mini" stroke , ?possibly hemaplegic migraine per pt    Patient Active Problem List   Diagnosis Date Noted  . Femur fracture, right ([place]) 08/09/2020  . Closed comminuted fracture of proximal ulna, left, initial encounter 04/04/2020  . Protein-calorie malnutrition, severe 07/04/2018  . UTI (urinary tract infection) 07/03/2018   . Nasal bone fractures 07/03/2018  . Knee pain 07/03/2018  . Normal anion gap metabolic acidosis [id number]  . Subdural hematoma ([place]) 07/02/2018  . Recurrent falls 04/16/2017  . 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 At risk for adverse drug event 04/07/2017  . Acute blood loss as cause of postoperative anemia 04/04/2017  . Acute delirium 04/04/2017  . Osteoporosis 04/02/2017  . Closed fracture of neck of left femur ([place]) 03/31/2017  . Scalp contusion 03/31/2017  . Fall at home, initial encounter 03/31/2017  . Hip fracture ([place]) 03/31/2017  . 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 Hx of transient ischemic attack (TIA) 02/19/2017  . Left carotid stenosis- 40-59% 02/19/2017  . Protein calorie malnutrition ([place]) 02/18/2017  . Falls 02/17/2017  . Chest pain 09/26/2016  . Diverticulitis of sigmoid colon 04/25/2016  . Infection due to ESBL-producing Escherichia coli/Diverticular Abscess 03/15/2016  . Diverticulitis 03/12/2016  . Chronic pain syndrome 03/12/2016  . Lesion of pancreas 03/12/2016  . History of chest pain 03/12/2016  . Hypokalemia 03/12/2016  . Angina, class IV ([place]) - chest tightness and pressure with dyspnea with minimal exertion 02/17/2016    Class: Question of  . TIA (transient ischemic attack) 12/26/2015  . DOE (dyspnea on exertion) 12/26/2015  . Right sided weakness 12/17/2015  . Ataxia 12/17/2015  . Shoulder fracture 02/15/2015  . Chronic headache 10/11/2013  . Weakness generalized  08/12/2013  . Dizziness and giddiness 08/12/2013  . Abnormality of gait 07/11/2013  . Hyperlipidemia 05/07/2007  . Migraine 05/07/2007  . PREMATURE VENTRICULAR CONTRACTIONS, FREQUENT 05/07/2007  . GERD (gastroesophageal reflux disease) 05/07/2007  . DIVERTICULOSIS, COLON 05/07/2007  . 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 OSTEOPENIA 05/07/2007  . Depression 03/24/2007  . HEMORRHOIDS 03/24/2007  . ALLERGIC RHINITIS 03/24/2007  . LOW BACK PAIN 03/24/2007  .  MIGRAINES, HX OF 03/24/2007    Past Surgical History:  Procedure Laterality Date  . ANTERIOR APPROACH HEMI HIP ARTHROPLASTY Left 04/01/2017   Procedure: LEFT DIRECT ANTERIOR TOTAL HIP REPLACEMENT;  Surgeon: Leandrew Koyanagi, MD;  Location: [place];  Service: Orthopedics;  Laterality: Left;  LEFT DIRECT ANTERIOR TOTAL HIP REPLACEMENT  . AUGMENTATION MAMMAPLASTY  1980  . BASAL CELL CARCINOMA EXCISION     "tip of my nose"  . BUNIONECTOMY Bilateral 10/2003  . CARDIAC CATHETERIZATION N/A 02/20/2016   Procedure: Left Heart Cath and Coronary Angiography;  Surgeon: Jettie Booze, MD;  Location: [facility];  Service: Cardiovascular;  Laterality: N/A;  . COLONOSCOPY    . DILATION AND CURETTAGE OF UTERUS    . FRACTURE SURGERY    . IR GENERIC HISTORICAL  03/18/2016   IR SINUS/FIST TUBE CHK-NON GI 03/18/2016 Aletta Edouard, MD [facility]INTER[facility] RAD  . LAPAROSCOPIC SIGMOID COLECTOMY N/A 04/25/2016   Procedure: LAPAROSCOPIC SIGMOID COLECTOMY;  Surgeon: Stark Klein, MD;  Location: [facility];  Service: General;  Laterality: N/A;  . OOPHORECTOMY Bilateral ~ 1999  . ORIF HUMERUS FRACTURE Right 02/15/2015   Procedure: OPEN REDUCTION INTERNAL FIXATION (ORIF) PROXIMAL HUMERUS FRACTURE;  Surgeon: Netta Cedars, MD;  Location: [facility];  Service: Orthopedics;  Laterality: Right;  . ORIF HUMERUS FRACTURE Left 04/04/2020  . ORIF HUMERUS FRACTURE Left 04/04/2020   Procedure: OPEN REDUCTION INTERNAL FIXATION (ORIF) PROXIMAL HUMERUS FRACTURE;  Surgeon: Iran Planas, MD;  Location: [facility];  Service: Orthopedics;  Laterality: Left;  regional block  . ORIF ULNAR FRACTURE Left 04/04/2020  . ORIF ULNAR FRACTURE Left 04/04/2020   Procedure: OPEN REDUCTION INTERNAL FIXATION (ORIF) ULNAR FRACTURE;  Surgeon: Iran Planas, MD;  Location: [facility];  Service: Orthopedics;  Laterality: Left;  with regional block  . RHINOPLASTY  1976  . TONSILLECTOMY    . TUBAL LIGATION  ~ 1983  . VAGINAL HYSTERECTOMY  ~ 1998     OB History    Gravida  0    Para  0   Term  0   Preterm  0   AB  0   Living  0     SAB  0   IAB  0   Ectopic  0   Multiple  0   Live Births  0           Family History  Problem Relation Age of Onset  . Lung cancer Mother 66  . Migraines Mother   . Heart attack Father        Vague  . Hypertension Brother   . Esophageal cancer Brother   . Diabetes Paternal Grandmother        questionable  . Colon cancer Neg Hx   . Kidney disease Neg Hx   . Liver disease Neg Hx     Social History   Tobacco Use  . Smoking status: Current Some Day Smoker    Packs/day: 0.25    Years: 34.00    Pack years: 8.50    Types: Cigarettes  . Smokeless tobacco: Never Used  Vaping Use  . Vaping Use: Never used  Substance Use Topics  . Alcohol use: Not Currently    Alcohol/week: 1.0 standard drink    Types: 1 Standard drinks or equivalent per week    Comment: occ   . Drug use: No    Types: Cocaine    Comment: Denies any drug use 02/19/18    Home Medications Prior to Admission medications   Medication Sig Start Date End Date Taking? Authorizing Provider  FLUoxetine (PROZAC) 40 MG capsule Take 80 mg by mouth daily. 04/18/18  Yes [provider]  gabapentin (NEURONTIN) 100 MG capsule Take 200 mg by mouth 3 (three) times daily.   Yes [provider]  omeprazole (PRILOSEC) 20 MG capsule Take 20 mg by mouth daily.   Yes [provider]  topiramate (TOPAMAX) 100 MG tablet Take 1 tablet (100 mg total) by mouth at bedtime. 12/28/17  Yes Kathrynn Ducking, MD  traZODone (DESYREL) 100 MG tablet Take 200 mg by mouth at bedtime. 07/16/20  Yes [provider]  carbidopa-levodopa (SINEMET IR) 25-100 MG tablet Take 1 tablet by mouth 3 (three) times daily. 7am/11am/4pm Patient not taking: No sig reported 04/11/20   Tat, Eustace Quail, DO  clonazePAM (KLONOPIN) 1 MG tablet Take 0.5 mg by mouth at bedtime as needed for anxiety.  03/21/20   [provider]  feeding supplement, ENSURE ENLIVE,  (ENSURE ENLIVE) LIQD Take 237 mLs by mouth 3 (three) times daily between meals. Patient not taking: Reported on 08/09/2020 07/06/18   Hosie Poisson, MD  Magnesium 500 MG TABS Take 500 mg by mouth daily.  Patient not taking: Reported on 08/09/2020    [provider]  mometasone (NASONEX) 50 MCG/ACT nasal spray Place 2 sprays into the nose daily. Patient not taking: Reported on 08/09/2020 04/22/17   Medina-Vargas, Monina C, NP  Multiple Vitamin (MULTIVITAMIN WITH MINERALS) TABS tablet Take 1 tablet by mouth daily. Patient not taking: Reported on 08/09/2020 07/07/18   Hosie Poisson, MD  ondansetron (ZOFRAN) 8 MG tablet Take 1 tablet (8 mg total) by mouth every 8 (eight) hours as needed for nausea or vomiting. Patient not taking: Reported on 08/09/2020 04/22/17   Medina-Vargas, Monina C, NP  trazodone (DESYREL) 300 MG tablet Take 1 tablet (300 mg total) by mouth at bedtime as needed for sleep. Patient not taking: Reported on 08/09/2020 04/22/17   Medina-Vargas, Monina C, NP  vitamin B-12 (CYANOCOBALAMIN) 1000 MCG tablet Take 1 tablet (1,000 mcg total) by mouth daily. Patient not taking: Reported on 08/09/2020 02/19/17   Debbe Odea, MD    Allergies    Opana [oxymorphone hcl] and Penicillins  Review of Systems   Review of Systems  Constitutional:       Per HPI, otherwise negative  HENT:       Per HPI, otherwise negative  Respiratory:       Per HPI, otherwise negative  Cardiovascular:       Per HPI, otherwise negative  Gastrointestinal: Negative for vomiting.  Endocrine:       Negative aside from HPI  Genitourinary:       Neg aside from HPI   Musculoskeletal:       Per HPI, otherwise negative  Skin: Negative.   Neurological: Positive for weakness. Negative for syncope.    Physical Exam Updated Vital Signs BP 139/74 (BP Location: Right Arm)   Pulse 65   Temp 99.7 F (37.6 C) (Oral)   Resp 16   Ht 5\' 5"  (1.651  m)   Wt 52.3 kg   SpO2 97%   BMI 19.19 kg/m   Physical  Exam Vitals and nursing note reviewed.  Constitutional:      General: She is not in acute distress.    Appearance: She is well-developed and well-nourished.  HENT:     Head: Normocephalic and atraumatic.  Eyes:     Extraocular Movements: EOM normal.     Conjunctiva/sclera: Conjunctivae normal.  Cardiovascular:     Rate and Rhythm: Normal rate and regular rhythm.  Pulmonary:     Effort: Pulmonary effort is normal. No respiratory distress.     Breath sounds: Normal breath sounds. No stridor.  Abdominal:     General: There is no distension.     Tenderness: There is no abdominal tenderness. There is no guarding.  Musculoskeletal:        General: No edema.       Legs:  Skin:    General: Skin is warm and dry.  Neurological:     Mental Status: She is alert and oriented to person, place, and time.     Cranial Nerves: No cranial nerve deficit.  Psychiatric:        Mood and Affect: Mood and affect normal.     ED Results / Procedures / Treatments   Labs (all labs ordered are listed, but only abnormal results are displayed) Labs Reviewed  BASIC METABOLIC PANEL - Abnormal; Notable for the following components:      Result Value   Calcium 7.9 (*)    All other components within normal limits  CBC WITH DIFFERENTIAL/PLATELET - Abnormal; Notable for the following components:   RDW 15.9 (*)    All other components within normal limits  RESP PANEL BY RT-PCR (FLU A&B, COVID) ARPGX2  PROTIME-INR  HIV ANTIBODY (ROUTINE TESTING W REFLEX)  CBC  COMPREHENSIVE METABOLIC PANEL  TYPE AND SCREEN    EKG None  Radiology DG Hip Unilat With Pelvis 2-3 Views Right  Result Date: 08/09/2020 CLINICAL DATA:  70 year old female with right hip pain after falling EXAM: DG HIP (WITH OR WITHOUT PELVIS) 2-3V RIGHT COMPARISON:  None. FINDINGS: Acute fracture through the right femoral neck consistent with a transcervical fracture. The femoral head remains located. Incompletely imaged surgical changes of  left total hip arthroplasty. Bony pelvis is intact. Left greater than right facet arthropathy at L4-L5 and L5-S1. IMPRESSION: Acute right transcervical femoral neck fracture. Electronically Signed   By: Jacqulynn Cadet M.D.   On: 08/09/2020 16:47    Procedures Procedures (including critical care time)  Medications Ordered in ED Medications  HYDROmorphone (DILAUDID) injection 1 mg (1 mg Intravenous Given 08/09/20 2137)  FLUoxetine (PROZAC) capsule 80 mg (has no administration in time range)  gabapentin (NEURONTIN) capsule 200 mg (has no administration in time range)  clonazePAM (KLONOPIN) tablet 0.5 mg (has no administration in time range)  traZODone (DESYREL) tablet 200 mg (has no administration in time range)  pantoprazole (PROTONIX) EC tablet 40 mg (has no administration in time range)  dextrose 5 %-0.9 % sodium chloride infusion ( Intravenous New Bag/Given 08/09/20 2134)  acetaminophen (TYLENOL) tablet 650 mg (has no administration in time range)    Or  acetaminophen (TYLENOL) suppository 650 mg (has no administration in time range)  fentaNYL (SUBLIMAZE) injection 12.5-50 mcg (has no administration in time range)  ondansetron (ZOFRAN) tablet 4 mg (has no administration in time range)    Or  ondansetron (ZOFRAN) injection 4 mg (has no administration in time range)  bisacodyl (DULCOLAX) EC tablet 5 mg (has no administration in time range)    ED Course  I have reviewed the triage vital signs and the nursing notes.  Pertinent labs & imaging results that were available during my care of the patient were reviewed by me and considered in my medical decision making (see chart for details).  Exam patient in similar condition.  I have reviewed the patient's x-ray, and I discussed with her.  Patient has femoral neck fracture, right-sided.  Update: I discussed the patient's with orthopedic colleague, Dr. Rolena Infante. Patient will require admission to the orthopedic team for surgical  repair. Subsequently discussed her case with our internal medicine colleagues for similar reasons, patient remains otherwise hemodynamically unremarkable, initial labs are reassuring, COVID test negative.  Final Clinical Impression(s) / ED Diagnoses Final diagnoses:  Closed fracture of neck of right femur, initial encounter ([place])  Fall, initial encounter   MDM Number of Diagnoses or Management Options Closed fracture of neck of right femur, initial encounter [facility]): new, needed workup Fall, initial encounter: new, needed workup   Amount and/or Complexity of Data Reviewed Clinical lab tests: reviewed Tests in the radiology section of CPT: reviewed Tests in the medicine section of CPT: reviewed Decide to obtain previous medical records or to obtain history from someone other than the patient: yes Obtain history from someone other than the patient: yes Review and summarize past medical records: yes Discuss the patient with other providers: yes Independent visualization of images, tracings, or specimens: yes  Risk of Complications, Morbidity, and/or Mortality Presenting problems: high Diagnostic procedures: high Management options: high  Critical Care Total time providing critical care: < 30 minutes  Patient Progress Patient progress: stable    Carmin Muskrat, MD 08/09/20 2221

## 2020-08-09 NOTE — ED Triage Notes (Signed)
Arrives via EMS from home, C/C fall, fell walking up two steps onto a carpeted platform, complains of R hip pain. Appears to have shortening and inward rotation. Hx of bilateral hip replacements. PMS intact, states she hit her head, denies LOC, no bloodthinners. EMS gave 200 mch of Fentanyl.

## 2020-08-09 NOTE — ED Notes (Signed)
EMS gave an additional 50 mcg of fent while pt was waiting on bed in the ER. Total 250 mcg given via EMS.

## 2020-08-09 NOTE — H&P (Signed)
History and Physical   Dorothy White UXN:[id number] DOB: 12/25/1950 DOA: 08/09/2020  Referring MD/NP/PA: Dr Vanita Panda  PCP: Maurice Small, MD   Outpatient Specialists: Dr Iran Planas, Orthopedics   Patient coming from: Home  Chief Complaint: Fall with right hip pain  HPI: Dorothy White is a 70 y.o. female with medical history significant of osteoarthritis and recurrent falls, history of left femur fracture with open reduction back in September 2021, GERD, basal cell carcinoma, anxiety disorder, recurrent constipation, diverticular disease, TIAs, hyperlipidemia, history of subdural hematoma and hemiplegic migraines who presented to the ER today after sustaining a mechanical fall at home.  Patient apparently tongue and slipped on the carpet and fell.  She sustained right hip pain and shortness of right lower extremity.  She was brought to the ER where she was found to have comminuted right femoral neck fracture.  Dr. Vanita Panda has spoken with Dr. Melina Schools who agrees to see patient.  Patient will have surgery on Saturday for bilateral one of his partners.  She has been admitted to the medical service for medical clearance.  At this point patient appears stable.  No new issues..  ED Course: Vitals and telemetry stable except for mild hypotension on arrival.  CBC completely within normal chemistry within normal except calcium 7.9.  Viral screen is negative.  X-ray of the right hip shows acute right transcervical femoral neck fracture.  Ortho consulted and patient be admitted for further evaluation and treatment.  Review of Systems: As per HPI otherwise 10 point review of systems negative.    Past Medical History:  Diagnosis Date  . Anemia    ?  Marland Kitchen Anxiety   . Arthritis    "everywhere" (04/25/2016)  . 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 Chronic back pain    "all over" (04/25/2016)  . Constipation   . Depression   . Diverticulitis   . GERD (gastroesophageal reflux disease)    . Head injury 2019   subdural hematoma  . Headache    "weekly" (04/25/2016)  . 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 History of blood transfusion   . HLD (hyperlipidemia)    hx (04/25/2016)  . 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 Mild CAD    a. 25% mLAD, otherwise no sig disease 01/2016 cath.  Marland Kitchen NICM (nonischemic cardiomyopathy) ([place])    a. EF 40-45% by echo 08/5425 at time of complicated migraine/neuro sx, 55-65% at time of cath 01/2016  . Stroke [facility]) 06/2013   "mini" stroke , ?possibly hemaplegic migraine per pt    Past Surgical History:  Procedure Laterality Date  . ANTERIOR APPROACH HEMI HIP ARTHROPLASTY Left 04/01/2017   Procedure: LEFT DIRECT ANTERIOR TOTAL HIP REPLACEMENT;  Surgeon: Leandrew Koyanagi, MD;  Location: [place];  Service: Orthopedics;  Laterality: Left;  LEFT DIRECT ANTERIOR TOTAL HIP REPLACEMENT  . AUGMENTATION MAMMAPLASTY  1980  . BASAL CELL CARCINOMA EXCISION     "tip of my nose"  . BUNIONECTOMY Bilateral 10/2003  . CARDIAC CATHETERIZATION N/A 02/20/2016   Procedure: Left Heart Cath and Coronary Angiography;  Surgeon: Jettie Booze, MD;  Location: [place] [facility];  Service: Cardiovascular;  Laterality: N/A;  . COLONOSCOPY    . DILATION AND CURETTAGE OF UTERUS    . FRACTURE SURGERY    . IR GENERIC HISTORICAL  03/18/2016   IR SINUS/FIST TUBE CHK-NON GI 03/18/2016 Aletta Edouard, MD [facility]-INTERV RAD  . LAPAROSCOPIC  SIGMOID COLECTOMY N/A 04/25/2016   Procedure: LAPAROSCOPIC SIGMOID COLECTOMY;  Surgeon: Stark Klein, MD;  Location: [place];  Service: General;  Laterality: N/A;  . OOPHORECTOMY Bilateral ~ 1999  . ORIF HUMERUS FRACTURE Right 02/15/2015   Procedure: OPEN REDUCTION INTERNAL FIXATION (ORIF) PROXIMAL HUMERUS FRACTURE;  Surgeon: Netta Cedars, MD;  Location: [place];  Service: Orthopedics;  Laterality: Right;  . ORIF HUMERUS FRACTURE Left 04/04/2020  . ORIF HUMERUS FRACTURE Left 04/04/2020   Procedure: OPEN  REDUCTION INTERNAL FIXATION (ORIF) PROXIMAL HUMERUS FRACTURE;  Surgeon: Iran Planas, MD;  Location: [place];  Service: Orthopedics;  Laterality: Left;  regional block  . ORIF ULNAR FRACTURE Left 04/04/2020  . ORIF ULNAR FRACTURE Left 04/04/2020   Procedure: OPEN REDUCTION INTERNAL FIXATION (ORIF) ULNAR FRACTURE;  Surgeon: Iran Planas, MD;  Location: [place];  Service: Orthopedics;  Laterality: Left;  with regional block  . RHINOPLASTY  1976  . TONSILLECTOMY    . TUBAL LIGATION  ~ 1983  . VAGINAL HYSTERECTOMY  ~ 1998     reports that she has been smoking cigarettes. She has a 8.50 pack-year smoking history. She has never used smokeless tobacco. She reports previous alcohol use of about 1.0 standard drink of alcohol per week. She reports that she does not use drugs.  Allergies  Allergen Reactions  . Opana [Oxymorphone Hcl] Other (See Comments)    hallucinations  . Penicillins Hives    Has patient had a PCN reaction causing immediate rash, facial/tongue/throat swelling, SOB or lightheadedness with hypotension: Unknown Has patient had a PCN reaction causing severe rash involving mucus membranes or skin necrosis: No Has patient had a PCN reaction that required hospitalization No Has patient had a PCN reaction occurring within the last 10 years: No If all of the above answers are "NO", then may proceed with Cephalosporin use.     Family History  Problem Relation Age of Onset  . Lung cancer Mother 29  . Migraines Mother   . Heart attack Father        Vague  . Hypertension Brother   . Esophageal cancer Brother   . Diabetes Paternal Grandmother        questionable  . Colon cancer Neg Hx   . Kidney disease Neg Hx   . Liver disease Neg Hx      Prior to Admission medications   Medication Sig Start Date End Date Taking? Authorizing Provider  FLUoxetine (PROZAC) 40 MG capsule Take 80 mg by mouth daily. 04/18/18  Yes [provider]  gabapentin (NEURONTIN) 100 MG capsule Take 200  mg by mouth 3 (three) times daily.   Yes [provider]  omeprazole (PRILOSEC) 20 MG capsule Take 20 mg by mouth daily.   Yes [provider]  topiramate (TOPAMAX) 100 MG tablet Take 1 tablet (100 mg total) by mouth at bedtime. 12/28/17  Yes Kathrynn Ducking, MD  traZODone (DESYREL) 100 MG tablet Take 200 mg by mouth at bedtime. 07/16/20  Yes [provider]  carbidopa-levodopa (SINEMET IR) 25-100 MG tablet Take 1 tablet by mouth 3 (three) times daily. 7am/11am/4pm Patient not taking: No sig reported 04/11/20   Tat, Eustace Quail, DO  clonazePAM (KLONOPIN) 1 MG tablet Take 0.5 mg by mouth at bedtime as needed for anxiety.  03/21/20   [provider]  feeding supplement, ENSURE ENLIVE, (ENSURE ENLIVE) LIQD Take 237 mLs by mouth 3 (three) times daily between meals. Patient not taking: Reported on 08/09/2020 07/06/18   Hosie Poisson, MD  Magnesium 500 MG TABS Take 500 mg by mouth daily.  Patient not taking: Reported on 08/09/2020    [provider]  mometasone (NASONEX) 50 MCG/ACT nasal spray Place 2 sprays into the nose daily. Patient not taking: Reported on 08/09/2020 04/22/17   Medina-Vargas, Monina C, NP  Multiple Vitamin (MULTIVITAMIN WITH MINERALS) TABS tablet Take 1 tablet by mouth daily. Patient not taking: Reported on 08/09/2020 07/07/18   Hosie Poisson, MD  ondansetron (ZOFRAN) 8 MG tablet Take 1 tablet (8 mg total) by mouth every 8 (eight) hours as needed for nausea or vomiting. Patient not taking: Reported on 08/09/2020 04/22/17   Medina-Vargas, Monina C, NP  trazodone (DESYREL) 300 MG tablet Take 1 tablet (300 mg total) by mouth at bedtime as needed for sleep. Patient not taking: Reported on 08/09/2020 04/22/17   Medina-Vargas, Monina C, NP  vitamin B-12 (CYANOCOBALAMIN) 1000 MCG tablet Take 1 tablet (1,000 mcg total) by mouth daily. Patient not taking: Reported on 08/09/2020 02/19/17   Debbe Odea, MD    Physical Exam: Vitals:   08/09/20 1455 08/09/20  1600 08/09/20 1615  BP: 112/62 99/70 123/66  Pulse: 66 63 65  Resp: 10 15 16   Temp: 98.8 F (37.1 C)    TempSrc: Oral    SpO2: 93% 96% 96%      Constitutional: Acutely ill looking, in acute pain Vitals:   08/09/20 1455 08/09/20 1600 08/09/20 1615  BP: 112/62 99/70 123/66  Pulse: 66 63 65  Resp: 10 15 16   Temp: 98.8 F (37.1 C)    TempSrc: Oral    SpO2: 93% 96% 96%   Eyes: PERRL, lids and conjunctivae normal ENMT: Mucous membranes are moist. Posterior pharynx clear of any exudate or lesions.Normal dentition.  Neck: normal, supple, no masses, no thyromegaly Respiratory: clear to auscultation bilaterally, no wheezing, no crackles. Normal respiratory effort. No accessory muscle use.  Cardiovascular: Regular rate and rhythm, no murmurs / rubs / gallops. No extremity edema. 2+ pedal pulses. No carotid bruits.  Abdomen: no tenderness, no masses palpated. No hepatosplenomegaly. Bowel sounds positive.  Musculoskeletal: no clubbing / cyanosis. Shortening of the right leg, no contractures. Normal muscle tone.  Skin: no rashes, lesions, ulcers. No induration Neurologic: CN 2-12 grossly intact. Sensation intact, DTR normal. Strength 5/5 in all 4.  Psychiatric: Normal judgment and insight. Alert and oriented x 3. Normal mood.     Labs on Admission: I have personally reviewed following labs and imaging studies  CBC: Recent Labs  Lab 08/09/20 1600  WBC 9.1  NEUTROABS 7.5  HGB 12.1  HCT 39.6  MCV 85.3  PLT 081   Basic Metabolic Panel: Recent Labs  Lab 08/09/20 1600  NA 139  K 4.0  CL 111  CO2 22  GLUCOSE 85  BUN 12  CREATININE 0.68  CALCIUM 7.9*   GFR: CrCl cannot be calculated (Unknown ideal weight.). Liver Function Tests: No results for input(s): AST, ALT, ALKPHOS, BILITOT, PROT, ALBUMIN in the last 168 hours. No results for input(s): LIPASE, AMYLASE in the last 168 hours. No results for input(s): AMMONIA in the last 168 hours. Coagulation Profile: Recent Labs   Lab 08/09/20 1600  INR 1.0   Cardiac Enzymes: No results for input(s): CKTOTAL, CKMB, CKMBINDEX, TROPONINI in the last 168 hours. BNP (last 3 results) No results for input(s): PROBNP in the last 8760 hours. HbA1C: No results for input(s): HGBA1C in the last 72 hours. CBG: No results for input(s): GLUCAP in the last 168 hours. Lipid Profile: No  results for input(s): CHOL, HDL, LDLCALC, TRIG, CHOLHDL, LDLDIRECT in the last 72 hours. Thyroid Function Tests: No results for input(s): TSH, T4TOTAL, FREET4, T3FREE, THYROIDAB in the last 72 hours. Anemia Panel: No results for input(s): VITAMINB12, FOLATE, FERRITIN, TIBC, IRON, RETICCTPCT in the last 72 hours. Urine analysis:    Component Value Date/Time   COLORURINE AMBER (A) 07/02/2018 2010   APPEARANCEUR HAZY (A) 07/02/2018 2010   LABSPEC 1.020 07/02/2018 2010   PHURINE 5.0 07/02/2018 2010   GLUCOSEU NEGATIVE 07/02/2018 2010   GLUCOSEU NEGATIVE 04/13/2007 1417   HGBUR NEGATIVE 07/02/2018 2010   [place] NEGATIVE 07/02/2018 2010   KETONESUR 5 (A) 07/02/2018 2010   PROTEINUR 100 (A) 07/02/2018 2010   UROBILINOGEN 0.2 02/04/2015 1800   NITRITE POSITIVE (A) 07/02/2018 2010   LEUKOCYTESUR MODERATE (A) 07/02/2018 2010   Sepsis Labs: @LABRCNTIP (procalcitonin:4,lacticidven:4) )No results found for this or any previous visit (from the past 240 hour(s)).   Radiological Exams on Admission: DG Hip Unilat With Pelvis 2-3 Views Right  Result Date: 08/09/2020 CLINICAL DATA:  70 year old female with right hip pain after falling EXAM: DG HIP (WITH OR WITHOUT PELVIS) 2-3V RIGHT COMPARISON:  None. FINDINGS: Acute fracture through the right femoral neck consistent with a transcervical fracture. The femoral head remains located. Incompletely imaged surgical changes of left total hip arthroplasty. Bony pelvis is intact. Left greater than right facet arthropathy at L4-L5 and L5-S1. IMPRESSION: Acute right transcervical femoral neck fracture.  Electronically Signed   By: Jacqulynn Cadet M.D.   On: 08/09/2020 16:47    EKG: Independently reviewed. NSR  Assessment/Plan Principal Problem:   Femur fracture, right (HCC) Active Problems:   Depression   GERD (gastroesophageal reflux disease)   DEGENERATION, CERVICAL DISC   Chronic pain syndrome   Recurrent falls     #1  Acute right femoral neck fracture: Patient has closed fracture.  Plan is for surgery hopeless Saturday.  We will admit the patient.  Pain control.  Supportive care.  I will initiate Lovenox for today but hold it tomorrow night for possible surgery on Saturday.  Refer to orthopedics.  No medical reason to avoid surgery.  #2  Chronic pain syndrome: Confirm and resume home regimen.  Order fentanyl for acute fracture.  #3 GERD: Continue with PPIs  #4 recurrent falls: Extensive PT and OT after surgery.  #5 depression with anxiety, continue home regimen.   DVT prophylaxis: Lovenox  Code Status: Full  Family Communication: No family at bedside  Disposition Plan: To be determined  Consults called: Dr. Melina Schools Admission status: inpatient   Severity of Illness: The appropriate patient status for this patient is INPATIENT. Inpatient status is judged to be reasonable and necessary in order to provide the required intensity of service to ensure the patient's safety. The patient's presenting symptoms, physical exam findings, and initial radiographic and laboratory data in the context of their chronic comorbidities is felt to place them at high risk for further clinical deterioration. Furthermore, it is not anticipated that the patient will be medically stable for discharge from the hospital within 2 midnights of admission. The following factors support the patient status of inpatient.   " The patient's presenting symptoms include Fall and hip fracture. " The worrisome physical exam findings include Shortening of right leg. " The initial radiographic and laboratory  data are worrisome because of Femural Fracture on the right. " The chronic co-morbidities include Recurrent falls.   * I certify that at the point of admission it is my  clinical judgment that the patient will require inpatient hospital care spanning beyond 2 midnights from the point of admission due to high intensity of service, high risk for further deterioration and high frequency of surveillance required.Barbette Merino MD [facility] Pager [phone]  If 7PM-7AM, please contact night-coverage [email or web address] Password [facility]  08/09/2020, 6:47 PM

## 2020-08-09 NOTE — Consult Note (Signed)
   Chief Complaint: Status post fall with right hip fracture History: Dorothy White is a 69 y.o. female with medical history significant of osteoarthritis and recurrent falls, history of left femur fracture with open reduction back in September 2021, GERD, basal cell carcinoma, anxiety disorder, recurrent constipation, diverticular disease, TIAs, hyperlipidemia, history of subdural hematoma and hemiplegic migraines who presented to the ER today after sustaining a mechanical fall at home.  Patient apparently tongue and slipped on the carpet and fell.  She sustained right hip pain and shortness of right lower extremity.  She was brought to the ER where she was found to have comminuted right femoral neck fracture.  Dr. Lockwood has spoken with Dr. Eleazar Kimmey who agrees to see patient.  Patient will have surgery on Saturday for bilateral one of his partners.  She has been admitted to the medical service for medical clearance.  At this point patient appears stable.  No new issues..  Review of systems: No recent fevers, chills. No nausea/vomiting. Positive history of anxiety, and diverticulosis. Prior history of TIAs.  Currently alert and oriented x3.  No mental status changes.  No loss of consciousness with recent fall.   Past Medical History:  Diagnosis Date  . Anemia    ?  . Anxiety   . Arthritis    "everywhere" (04/25/2016)  . 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 Chronic back pain    "all over" (04/25/2016)  . Constipation   . Depression   . Diverticulitis   . GERD (gastroesophageal reflux disease)   . Head injury 2019   subdural hematoma  . Headache    "weekly" (04/25/2016)  . 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 History of blood transfusion   . HLD (hyperlipidemia)    hx (04/25/2016)  . 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 Mild CAD    a. 25% mLAD, otherwise no sig disease 01/2016 cath.  . NICM (nonischemic  cardiomyopathy) (HCC)    a. EF 40-45% by echo 11/2015 at time of complicated migraine/neuro sx, 55-65% at time of cath 01/2016  . Stroke ([facility]CC) 06/2013   "mini" stroke , ?possibly hemaplegic migraine per pt    Allergies  Allergen Reactions  . Opana [Oxymorphone Hcl] Other (See Comments)    hallucinations  . Penicillins Hives    Has patient had a PCN reaction causing immediate rash, facial/tongue/throat swelling, SOB or lightheadedness with hypotension: Unknown Has patient had a PCN reaction causing severe rash involving mucus membranes or skin necrosis: No Has patient had a PCN reaction that required hospitalization No Has patient had a PCN reaction occurring within the last 10 years: No If all of the above answers are "NO", then may proceed with Cephalosporin use.     No current facility-administered medications on file prior to encounter.   Current Outpatient Medications on File Prior to Encounter  Medication Sig Dispense Refill  . FLUoxetine (PROZAC) 40 MG capsule Take 80 mg by mouth daily.  11  . gabapentin (NEURONTIN) 100 MG capsule Take 200 mg by mouth 3 (three) times daily.    . omeprazole (PRILOSEC) 20 MG capsule Take 20 mg by mouth daily.    . topiramate (TOPAMAX) 100 MG tablet Take 1 tablet (100 mg total) by mouth at bedtime. 90 tablet 3  . traZODone (DESYREL) 100 MG tablet Take 200 mg by mouth at bedtime.    . carbidopa-levodopa (SINEMET IR) 25-100 MG tablet   Take 1 tablet by mouth 3 (three) times daily. 7am/11am/4pm (Patient not taking: No sig reported) 270 tablet 1  . clonazePAM (KLONOPIN) 1 MG tablet Take 0.5 mg by mouth at bedtime as needed for anxiety.     . feeding supplement, ENSURE ENLIVE, (ENSURE ENLIVE) LIQD Take 237 mLs by mouth 3 (three) times daily between meals. (Patient not taking: Reported on 08/09/2020) 237 mL 12  . Magnesium 500 MG TABS Take 500 mg by mouth daily.  (Patient not taking: Reported on 08/09/2020)    . mometasone (NASONEX) 50 MCG/ACT nasal spray  Place 2 sprays into the nose daily. (Patient not taking: Reported on 08/09/2020) 17 g 0  . Multiple Vitamin (MULTIVITAMIN WITH MINERALS) TABS tablet Take 1 tablet by mouth daily. (Patient not taking: Reported on 08/09/2020) 30 tablet 0  . ondansetron (ZOFRAN) 8 MG tablet Take 1 tablet (8 mg total) by mouth every 8 (eight) hours as needed for nausea or vomiting. (Patient not taking: Reported on 08/09/2020) 30 tablet 0  . trazodone (DESYREL) 300 MG tablet Take 1 tablet (300 mg total) by mouth at bedtime as needed for sleep. (Patient not taking: Reported on 08/09/2020) 30 tablet 0  . vitamin B-12 (CYANOCOBALAMIN) 1000 MCG tablet Take 1 tablet (1,000 mcg total) by mouth daily. (Patient not taking: Reported on 08/09/2020) 30 tablet 0    Physical Exam: Vitals:   08/09/20 1600 08/09/20 1615  BP: 99/70 123/66  Pulse: 63 65  Resp: 15 16  Temp:    SpO2: 96% 96%   There is no height or weight on file to calculate BMI. Patient is alert and oriented x3. No shortness of breath.  Lungs clear to auscultation bilaterally. Cardiac: Regular rate and rhythm.  No rubs gallops murmurs. Abdomen is soft and nontender.  No incontinence of bowel and bladder, no rebound tenderness. Full range of motion of the upper extremity with no gross crepitus deformity or pain. Lower extremity: 5/5 motor strength in the EHL/tibialis anterior/gastrocnemius.  Normal sensation to light touch in the lower extremity.  Significant pain of the right groin.  Pain intensified with gentle palpation or range of motion.  The extremity itself is shortened and slightly externally rotated.  No knee pain or swelling noted.  Left lower extremity is asymptomatic. 2+ dorsalis pedis/posterior tibialis pulses bilaterally.  Compartments are soft and nontender.  Image: DG Hip Unilat With Pelvis 2-3 Views Right  Result Date: 08/09/2020 CLINICAL DATA:  70 year old female with right hip pain after falling EXAM: DG HIP (WITH OR WITHOUT PELVIS) 2-3V RIGHT  COMPARISON:  None. FINDINGS: Acute fracture through the right femoral neck consistent with a transcervical fracture. The femoral head remains located. Incompletely imaged surgical changes of left total hip arthroplasty. Bony pelvis is intact. Left greater than right facet arthropathy at L4-L5 and L5-S1. IMPRESSION: Acute right transcervical femoral neck fracture. Electronically Signed   By: Jacqulynn Cadet M.D.   On: 08/09/2020 16:47    A/P: Dorothy White is a very pleasant 70 year old woman who unfortunately had a fall and now has a displaced right femoral neck fracture.  Patient is neurovascularly intact and has no signs or symptoms of compartment syndrome.  This is a closed injury.  Plan on admission to the hospitalist per protocol.  I have spoken with Dr. Lyla Glassing my partner and he will move forward with definitive fracture fixation most likely Saturday morning.  If there is any concerns or questions please not hesitate to contact my office at [phone]

## 2020-08-10 ENCOUNTER — Inpatient Hospital Stay (HOSPITAL_COMMUNITY): Payer: Medicare Other

## 2020-08-10 LAB — COMPREHENSIVE METABOLIC PANEL
ALT: 10 U/L (ref 0–44)
AST: 11 U/L — ABNORMAL LOW (ref 15–41)
Albumin: 3.2 g/dL — ABNORMAL LOW (ref 3.5–5.0)
Alkaline Phosphatase: 114 U/L (ref 38–126)
Anion gap: 12 (ref 5–15)
BUN: 9 mg/dL (ref 8–23)
CO2: 20 mmol/L — ABNORMAL LOW (ref 22–32)
Calcium: 8.5 mg/dL — ABNORMAL LOW (ref 8.9–10.3)
Chloride: 109 mmol/L (ref 98–111)
Creatinine, Ser: 0.61 mg/dL (ref 0.44–1.00)
GFR, Estimated: >60 mL/min (ref >60)
Glucose, Bld: 100 mg/dL — ABNORMAL HIGH (ref 70–99)
Potassium: 3.3 mmol/L — ABNORMAL LOW (ref 3.5–5.1)
Sodium: 141 mmol/L (ref 135–145)
Total Bilirubin: 0.9 mg/dL (ref 0.3–1.2)
Total Protein: 5.7 g/dL — ABNORMAL LOW (ref 6.5–8.1)

## 2020-08-10 LAB — CBC
HCT: 40.4 % (ref 36.0–46.0)
Hemoglobin: 12 g/dL (ref 12.0–15.0)
MCH: 25.9 pg — ABNORMAL LOW (ref 26.0–34.0)
MCHC: 29.7 g/dL — ABNORMAL LOW (ref 30.0–36.0)
MCV: 87.3 fL (ref 80.0–100.0)
Platelets: 157 10*3/uL (ref 150–400)
RBC: 4.63 MIL/uL (ref 3.87–5.11)
RDW: 15.9 % — ABNORMAL HIGH (ref 11.5–15.5)
WBC: 8.5 10*3/uL (ref 4.0–10.5)
nRBC: 0 % (ref 0.0–0.2)

## 2020-08-10 LAB — SURGICAL PCR SCREEN
MRSA, PCR: NEGATIVE
Staphylococcus aureus: POSITIVE — AB

## 2020-08-10 LAB — HIV ANTIBODY (ROUTINE TESTING W REFLEX): HIV Screen 4th Generation wRfx: NONREACTIVE

## 2020-08-10 MED ORDER — CEFAZOLIN SODIUM-DEXTROSE 2-4 GM/100ML-% IV SOLN
2.0000 g | INTRAVENOUS | Status: AC
  Administered 2020-08-11: 2 g via INTRAVENOUS

## 2020-08-10 MED ORDER — POVIDONE-IODINE 10 % EX SWAB: 2.0000 "application " | Freq: Once | CUTANEOUS | Status: DC

## 2020-08-10 MED ORDER — ENSURE PRE-SURGERY PO LIQD
296.0000 mL | Freq: Once | ORAL | Status: AC
  Administered 2020-08-11: 296 mL via ORAL
  Filled 2020-08-10: qty 296

## 2020-08-10 MED ORDER — TRANEXAMIC ACID-NACL 1000-0.7 MG/100ML-% IV SOLN
1000.0000 mg | INTRAVENOUS | Status: AC
  Administered 2020-08-11: 1000 mg via INTRAVENOUS

## 2020-08-10 MED ORDER — HYDROMORPHONE HCL 1 MG/ML IJ SOLN
0.5000 mg | INTRAMUSCULAR | Status: DC | PRN
  Administered 2020-08-10 – 2020-08-11 (×4): 0.5 mg via INTRAVENOUS
  Filled 2020-08-10 (×4): qty 0.5

## 2020-08-10 MED ORDER — MUPIROCIN 2 % EX OINT
1.0000 "application " | TOPICAL_OINTMENT | Freq: Two times a day (BID) | CUTANEOUS | Status: DC
  Administered 2020-08-10 – 2020-08-14 (×9): 1 via NASAL
  Filled 2020-08-10 (×2): qty 22

## 2020-08-10 MED ORDER — CHLORHEXIDINE GLUCONATE 4 % EX LIQD
60.0000 mL | Freq: Once | CUTANEOUS | Status: AC
  Administered 2020-08-11: 4 via TOPICAL

## 2020-08-10 MED ORDER — OXYCODONE HCL 5 MG PO TABS
5.0000 mg | ORAL_TABLET | Freq: Four times a day (QID) | ORAL | Status: DC | PRN
Start: 2020-08-10 — End: 2020-08-13
  Administered 2020-08-10 – 2020-08-13 (×6): 5 mg via ORAL
  Filled 2020-08-10 (×6): qty 1

## 2020-08-10 MED ORDER — CHLORHEXIDINE GLUCONATE CLOTH 2 % EX PADS
6.0000 | MEDICATED_PAD | Freq: Every day | CUTANEOUS | Status: DC
  Administered 2020-08-10 – 2020-08-14 (×3): 6 via TOPICAL

## 2020-08-10 MED ORDER — POTASSIUM CHLORIDE CRYS ER 20 MEQ PO TBCR
40.0000 meq | EXTENDED_RELEASE_TABLET | Freq: Once | ORAL | Status: AC
  Administered 2020-08-10: 40 meq via ORAL
  Filled 2020-08-10: qty 2

## 2020-08-10 NOTE — Progress Notes (Signed)
PROGRESS NOTE    Dorothy White  WCH:[id number] DOB: 11-25-1950 DOA: 08/09/2020 PCP: Shirlean Mylar, MD   Chief Complain: Fall followed by pain of the right hip  Brief Narrative: Patient is a 70 year old female with history of osteoarthritis, recurrent falls, left femur fracture in September 2021, GERD, basal cell carcinoma, anxiety disorder, hyperlipidemia who presented to the emergency department after a mechanical fall at home.  After fall ,she developed right hip pain and shortening of right lower extremity.  In the emergency department she was found to have displaced right femoral neck fracture.  Orthopedics consulted.  Planning for ORIF tomorrow.  Assessment & Plan:   Principal Problem:   Femur fracture, right (HCC) Active Problems:   Depression   GERD (gastroesophageal reflux disease)   DEGENERATION, CERVICAL DISC   Chronic pain syndrome   Recurrent falls   Acute right femoral neck fracture: Plan for surgery by orthopedics on Saturday.  Continue pain management, supportive care.  Continue SCD for DVT prophylaxis for now.  PT/OT evaluation after surgery  Hypokalemia: Supplemented with potassium  Chronic pain syndrome: Continue pain management.  Supportive care  GERD: Continue PPI  History of recurrent falls: She had a fracture of left femur in September 2021.  PT/OT consultation after surgery  Depression/anxiety: Continue home regimen         DVT prophylaxis: Lovenox Code Status: Full code Family Communication: None at bedside Status is: Inpatient  Remains inpatient appropriate because:Inpatient level of care appropriate due to severity of illness   Dispo: The patient is from: Home              Anticipated d/c is to: SNF              Anticipated d/c date is: 3 days              Patient currently is not medically stable to d/c.    Consultants: Orthopedics  Procedures:None  Antimicrobials:  Anti-infectives (From admission, onward)   None       Subjective: Patient seen and examined the bedside this morning.  Complains of pain on the fracture site.  Otherwise hemodynamically stable  Objective: Vitals:   08/09/20 2128 08/09/20 2140 08/10/20 0205 08/10/20 0545  BP: 139/74  127/64 (!) 117/44  Pulse: 65  66 71  Resp: 16  16 15   Temp: 99.7 F (37.6 C)  98.2 F (36.8 C) 99 F (37.2 C)  TempSrc: Oral   Oral  SpO2: 97%  98% 93%  Weight:  52.3 kg    Height:  5\' 5"  (1.651 m)      Intake/Output Summary (Last 24 hours) at 08/10/2020 0815 Last data filed at 08/10/2020 0700 Gross per 24 hour  Intake 768.23 ml  Output -  Net 768.23 ml   Filed Weights   08/09/20 2140  Weight: 52.3 kg    Examination:  General exam: Not in acute distress, weak HEENT:PERRL,Oral mucosa moist, Ear/Nose normal on gross exam Respiratory system: Bilateral equal air entry, normal vesicular breath sounds, no wheezes or crackles  Cardiovascular system: S1 & S2 heard, RRR. No JVD, murmurs, rubs, gallops or clicks. No pedal edema. Gastrointestinal system: Abdomen is nondistended, soft and nontender. No organomegaly or masses felt. Normal bowel sounds heard. Central nervous system: Alert and oriented. No focal neurological deficits. Extremities: No edema, no clubbing ,no cyanosis, tenderness on the right hip, decreased range of  motion  skin: No rashes, lesions or ulcers,no icterus ,no pallor   Data Reviewed: I have  personally reviewed following labs and imaging studies  CBC: Recent Labs  Lab 08/09/20 1600 08/10/20 0341  WBC 9.1 8.5  NEUTROABS 7.5  --   HGB 12.1 12.0  HCT 39.6 40.4  MCV 85.3 87.3  PLT 235 106   Basic Metabolic Panel: Recent Labs  Lab 08/09/20 1600 08/10/20 0341  NA 139 141  K 4.0 3.3*  CL 111 109  CO2 22 20*  GLUCOSE 85 100*  BUN 12 9  CREATININE 0.68 0.61  CALCIUM 7.9* 8.5*   GFR: Estimated Creatinine Clearance: 54.8 mL/min (by C-G formula based on SCr of 0.61 mg/dL). Liver Function Tests: Recent Labs  Lab  08/10/20 0341  AST 11*  ALT 10  ALKPHOS 114  BILITOT 0.9  PROT 5.7*  ALBUMIN 3.2*   No results for input(s): LIPASE, AMYLASE in the last 168 hours. No results for input(s): AMMONIA in the last 168 hours. Coagulation Profile: Recent Labs  Lab 08/09/20 1600  INR 1.0   Cardiac Enzymes: No results for input(s): CKTOTAL, CKMB, CKMBINDEX, TROPONINI in the last 168 hours. BNP (last 3 results) No results for input(s): PROBNP in the last 8760 hours. HbA1C: No results for input(s): HGBA1C in the last 72 hours. CBG: No results for input(s): GLUCAP in the last 168 hours. Lipid Profile: No results for input(s): CHOL, HDL, LDLCALC, TRIG, CHOLHDL, LDLDIRECT in the last 72 hours. Thyroid Function Tests: No results for input(s): TSH, T4TOTAL, FREET4, T3FREE, THYROIDAB in the last 72 hours. Anemia Panel: No results for input(s): VITAMINB12, FOLATE, FERRITIN, TIBC, IRON, RETICCTPCT in the last 72 hours. Sepsis Labs: No results for input(s): PROCALCITON, LATICACIDVEN in the last 168 hours.  Recent Results (from the past 240 hour(s))  Resp Panel by RT-PCR (Flu A&B, Covid) Nasopharyngeal Swab     Status: None   Collection Time: 08/09/20  5:12 PM   Specimen: Nasopharyngeal Swab; Nasopharyngeal(NP) swabs in vial transport medium  Result Value Ref Range Status   SARS Coronavirus 2 by RT PCR NEGATIVE NEGATIVE Final    Comment: (NOTE) SARS-CoV-2 target nucleic acids are NOT DETECTED.  The SARS-CoV-2 RNA is generally detectable in upper respiratory specimens during the acute phase of infection. The lowest concentration of SARS-CoV-2 viral copies this assay can detect is 138 copies/mL. A negative result does not preclude SARS-Cov-2 infection and should not be used as the sole basis for treatment or other patient management decisions. A negative result may occur with  improper specimen collection/handling, submission of specimen other than nasopharyngeal swab, presence of viral mutation(s) within  the areas targeted by this assay, and inadequate number of viral copies(<138 copies/mL). A negative result must be combined with clinical observations, patient history, and epidemiological information. The expected result is Negative.  Fact Sheet for Patients:  EntrepreneurPulse.com.au  Fact Sheet for Healthcare Providers:  IncredibleEmployment.be  This test is no t yet approved or cleared by the [place] FDA and  has been authorized for detection and/or diagnosis of SARS-CoV-2 by FDA under an Emergency Use Authorization (EUA). This EUA will remain  in effect (meaning this test can be used) for the duration of the COVID-19 declaration under Section 564(b)(1) of the Act, 21 U.S.C.section 360bbb-3(b)(1), unless the authorization is terminated  or revoked sooner.       Influenza A by PCR NEGATIVE NEGATIVE Final   Influenza B by PCR NEGATIVE NEGATIVE Final    Comment: (NOTE) The Xpert Xpress SARS-CoV-2/FLU/RSV plus assay is intended as an aid in the diagnosis of influenza from Nasopharyngeal swab specimens  and should not be used as a sole basis for treatment. Nasal washings and aspirates are unacceptable for Xpert Xpress SARS-CoV-2/FLU/RSV testing.  Fact Sheet for Patients: EntrepreneurPulse.com.au  Fact Sheet for Healthcare Providers: IncredibleEmployment.be  This test is not yet approved or cleared by the [place] FDA and has been authorized for detection and/or diagnosis of SARS-CoV-2 by FDA under an Emergency Use Authorization (EUA). This EUA will remain in effect (meaning this test can be used) for the duration of the COVID-19 declaration under Section 564(b)(1) of the Act, 21 U.S.C. section 360bbb-3(b)(1), unless the authorization is terminated or revoked.  Performed at [facility], [place] [address] [place], [place] [address]   Surgical pcr screen     Status: Abnormal    Collection Time: 08/09/20 10:39 PM   Specimen: Nasal Mucosa; Nasal Swab  Result Value Ref Range Status   MRSA, PCR NEGATIVE NEGATIVE Final   Staphylococcus aureus POSITIVE (A) NEGATIVE Final    Comment: (NOTE) The Xpert SA Assay (FDA approved for NASAL specimens in patients 30 years of age and older), is one component of a comprehensive surveillance program. It is not intended to diagnose infection nor to guide or monitor treatment. Performed at [facility], [place] [address] [place],  [address]          Radiology Studies: DG Hip Unilat With Pelvis 2-3 Views Right  Result Date: 08/09/2020 CLINICAL DATA:  70 year old female with right hip pain after falling EXAM: DG HIP (WITH OR WITHOUT PELVIS) 2-3V RIGHT COMPARISON:  None. FINDINGS: Acute fracture through the right femoral neck consistent with a transcervical fracture. The femoral head remains located. Incompletely imaged surgical changes of left total hip arthroplasty. Bony pelvis is intact. Left greater than right facet arthropathy at L4-L5 and L5-S1. IMPRESSION: Acute right transcervical femoral neck fracture. Electronically Signed   By: Jacqulynn Cadet M.D.   On: 08/09/2020 16:47        Scheduled Meds: . Chlorhexidine Gluconate Cloth  6 each Topical Daily  . FLUoxetine  80 mg Oral Daily  . gabapentin  200 mg Oral TID  . mupirocin ointment  1 application Nasal BID  . pantoprazole  40 mg Oral Daily  . traZODone  200 mg Oral QHS   Continuous Infusions: . dextrose 5 % and 0.9% NaCl Stopped (08/10/20 0145)     LOS: 1 day    Time spent: 25 mins.More than 50% of that time was spent in counseling and/or coordination of care.      Shelly Coss, MD [facility] P1/14/2022, 8:15 AM

## 2020-08-10 NOTE — Plan of Care (Signed)
Plan of care initiated.

## 2020-08-10 NOTE — Progress Notes (Signed)
Patient was seen this AM and was resting in bed. I discussed plan for surgical fixation of the right hip utilizing THA on Saturday morning with the patient. She verbalized her understanding and consent for surgery.

## 2020-08-11 ENCOUNTER — Inpatient Hospital Stay (HOSPITAL_COMMUNITY): Payer: Medicare Other

## 2020-08-11 ENCOUNTER — Encounter (HOSPITAL_COMMUNITY): Payer: Self-pay | Admitting: Internal Medicine

## 2020-08-11 ENCOUNTER — Encounter (HOSPITAL_COMMUNITY)
Admission: EM | Disposition: A | Discharge status: Skilled Nursing Facility | Payer: Self-pay | Source: Home / Self Care | Attending: Internal Medicine

## 2020-08-11 ENCOUNTER — Inpatient Hospital Stay (HOSPITAL_COMMUNITY): Payer: Medicare Other | Admitting: Anesthesiology

## 2020-08-11 HISTORY — PX: TOTAL HIP ARTHROPLASTY: SHX124

## 2020-08-11 LAB — CBC WITH DIFFERENTIAL/PLATELET
Abs Immature Granulocytes: 0.01 10*3/uL (ref 0.00–0.07)
Basophils Absolute: 0 10*3/uL (ref 0.0–0.1)
Basophils Relative: 0 %
Eosinophils Absolute: 0.3 10*3/uL (ref 0.0–0.5)
Eosinophils Relative: 5 %
HCT: 39.6 % (ref 36.0–46.0)
Hemoglobin: 11.7 g/dL — ABNORMAL LOW (ref 12.0–15.0)
Immature Granulocytes: 0 %
Lymphocytes Relative: 26 %
Lymphs Abs: 1.3 10*3/uL (ref 0.7–4.0)
MCH: 26 pg (ref 26.0–34.0)
MCHC: 29.5 g/dL — ABNORMAL LOW (ref 30.0–36.0)
MCV: 88 fL (ref 80.0–100.0)
Monocytes Absolute: 0.5 10*3/uL (ref 0.1–1.0)
Monocytes Relative: 9 %
Neutro Abs: 3.1 10*3/uL (ref 1.7–7.7)
Neutrophils Relative %: 60 %
Platelets: 173 10*3/uL (ref 150–400)
RBC: 4.5 MIL/uL (ref 3.87–5.11)
RDW: 15.9 % — ABNORMAL HIGH (ref 11.5–15.5)
WBC: 5.1 10*3/uL (ref 4.0–10.5)
nRBC: 0 % (ref 0.0–0.2)

## 2020-08-11 LAB — POCT I-STAT 7, (LYTES, BLD GAS, ICA,H+H)
Acid-base deficit: 3 mmol/L — ABNORMAL HIGH (ref 0.0–2.0)
Acid-base deficit: 2 mmol/L (ref 0.0–2.0)
Bicarbonate: 24.4 mmol/L (ref 20.0–28.0)
Bicarbonate: 24 mmol/L (ref 20.0–28.0)
Calcium, Ion: 1.26 mmol/L (ref 1.15–1.40)
Calcium, Ion: 1.27 mmol/L (ref 1.15–1.40)
HCT: 27 % — ABNORMAL LOW (ref 36.0–46.0)
HCT: 24 % — ABNORMAL LOW (ref 36.0–46.0)
Hemoglobin: 9.2 g/dL — ABNORMAL LOW (ref 12.0–15.0)
Hemoglobin: 8.2 g/dL — ABNORMAL LOW (ref 12.0–15.0)
O2 Saturation: 100 %
O2 Saturation: 100 %
Potassium: 3.8 mmol/L (ref 3.5–5.1)
Potassium: 3.8 mmol/L (ref 3.5–5.1)
Sodium: 143 mmol/L (ref 135–145)
Sodium: 144 mmol/L (ref 135–145)
TCO2: 26 mmol/L (ref 22–32)
TCO2: 26 mmol/L (ref 22–32)
pCO2 arterial: 58.7 mmHg — ABNORMAL HIGH (ref 32.0–48.0)
pCO2 arterial: 49.2 mmHg — ABNORMAL HIGH (ref 32.0–48.0)
pH, Arterial: 7.227 — ABNORMAL LOW (ref 7.350–7.450)
pH, Arterial: 7.297 — ABNORMAL LOW (ref 7.350–7.450)
pO2, Arterial: 287 mmHg — ABNORMAL HIGH (ref 83.0–108.0)
pO2, Arterial: 214 mmHg — ABNORMAL HIGH (ref 83.0–108.0)

## 2020-08-11 LAB — BASIC METABOLIC PANEL
Anion gap: 9 (ref 5–15)
BUN: 6 mg/dL — ABNORMAL LOW (ref 8–23)
CO2: 22 mmol/L (ref 22–32)
Calcium: 8.6 mg/dL — ABNORMAL LOW (ref 8.9–10.3)
Chloride: 111 mmol/L (ref 98–111)
Creatinine, Ser: 0.61 mg/dL (ref 0.44–1.00)
GFR, Estimated: >60 mL/min (ref >60)
Glucose, Bld: 227 mg/dL — ABNORMAL HIGH (ref 70–99)
Potassium: 3.8 mmol/L (ref 3.5–5.1)
Sodium: 142 mmol/L (ref 135–145)

## 2020-08-11 LAB — CBC
HCT: 32.7 % — ABNORMAL LOW (ref 36.0–46.0)
Hemoglobin: 10.1 g/dL — ABNORMAL LOW (ref 12.0–15.0)
MCH: 27.4 pg (ref 26.0–34.0)
MCHC: 30.9 g/dL (ref 30.0–36.0)
MCV: 88.9 fL (ref 80.0–100.0)
Platelets: 134 10*3/uL — ABNORMAL LOW (ref 150–400)
RBC: 3.68 MIL/uL — ABNORMAL LOW (ref 3.87–5.11)
RDW: 14.7 % (ref 11.5–15.5)
WBC: 8.1 10*3/uL (ref 4.0–10.5)
nRBC: 0 % (ref 0.0–0.2)

## 2020-08-11 SURGERY — ARTHROPLASTY, HIP, TOTAL, ANTERIOR APPROACH
Anesthesia: Monitor Anesthesia Care | Site: Hip | Laterality: Right

## 2020-08-11 MED ORDER — PHENYLEPHRINE HCL-NACL 10-0.9 MG/250ML-% IV SOLN
INTRAVENOUS | Status: DC | PRN
  Administered 2020-08-11: 50 ug/min via INTRAVENOUS

## 2020-08-11 MED ORDER — TRANEXAMIC ACID-NACL 1000-0.7 MG/100ML-% IV SOLN
INTRAVENOUS | Status: AC
  Filled 2020-08-11: qty 100

## 2020-08-11 MED ORDER — ALBUTEROL SULFATE HFA 108 (90 BASE) MCG/ACT IN AERS
INHALATION_SPRAY | RESPIRATORY_TRACT | Status: AC
  Filled 2020-08-11: qty 6.7

## 2020-08-11 MED ORDER — ISOPROPYL ALCOHOL 70 % SOLN
Status: DC | PRN
  Administered 2020-08-11: 1 via TOPICAL

## 2020-08-11 MED ORDER — HYDROMORPHONE HCL 1 MG/ML IJ SOLN
INTRAMUSCULAR | Status: AC
  Administered 2020-08-11: 0.25 mg via INTRAVENOUS
  Filled 2020-08-11: qty 1

## 2020-08-11 MED ORDER — PROPOFOL 10 MG/ML IV BOLUS
INTRAVENOUS | Status: AC
  Filled 2020-08-11: qty 20

## 2020-08-11 MED ORDER — ALBUMIN HUMAN 5 % IV SOLN: INTRAVENOUS | Status: DC | PRN

## 2020-08-11 MED ORDER — BUPIVACAINE IN DEXTROSE 0.75-8.25 % IT SOLN
INTRATHECAL | Status: DC | PRN
Start: 2020-08-11 — End: 2020-08-11
  Administered 2020-08-11: 2 mL via INTRATHECAL

## 2020-08-11 MED ORDER — CEFAZOLIN SODIUM-DEXTROSE 2-4 GM/100ML-% IV SOLN
2.0000 g | Freq: Four times a day (QID) | INTRAVENOUS | Status: AC
  Administered 2020-08-11 (×2): 2 g via INTRAVENOUS
  Filled 2020-08-11 (×2): qty 100

## 2020-08-11 MED ORDER — SODIUM CHLORIDE (PF) 0.9 % IJ SOLN
INTRAMUSCULAR | Status: DC | PRN
  Administered 2020-08-11: 29 mL

## 2020-08-11 MED ORDER — SODIUM CHLORIDE 0.9 % IR SOLN
Status: DC | PRN
  Administered 2020-08-11: 4000 mL

## 2020-08-11 MED ORDER — FENTANYL CITRATE (PF) 250 MCG/5ML IJ SOLN
INTRAMUSCULAR | Status: AC
  Filled 2020-08-11: qty 5

## 2020-08-11 MED ORDER — OXYCODONE HCL 5 MG PO TABS
5.0000 mg | ORAL_TABLET | Freq: Once | ORAL | Status: DC | PRN
Start: 2020-08-11 — End: 2020-08-11

## 2020-08-11 MED ORDER — SODIUM CHLORIDE 0.9 % IV SOLN: INTRAVENOUS | Status: DC

## 2020-08-11 MED ORDER — FENTANYL CITRATE (PF) 250 MCG/5ML IJ SOLN
INTRAMUSCULAR | Status: DC | PRN
  Administered 2020-08-11: 50 ug via INTRAVENOUS

## 2020-08-11 MED ORDER — CEFAZOLIN SODIUM-DEXTROSE 2-4 GM/100ML-% IV SOLN
INTRAVENOUS | Status: AC
  Filled 2020-08-11: qty 100

## 2020-08-11 MED ORDER — MENTHOL 3 MG MT LOZG: 1.0000 | LOZENGE | OROMUCOSAL | Status: DC | PRN

## 2020-08-11 MED ORDER — HYDROMORPHONE HCL 1 MG/ML IJ SOLN
0.2500 mg | INTRAMUSCULAR | Status: DC | PRN
  Administered 2020-08-11 (×3): 0.5 mg via INTRAVENOUS
  Administered 2020-08-11: 0.25 mg via INTRAVENOUS

## 2020-08-11 MED ORDER — SODIUM CHLORIDE 0.9 % IR SOLN
Status: DC | PRN
  Administered 2020-08-11: 1000 mL

## 2020-08-11 MED ORDER — DOCUSATE SODIUM 100 MG PO CAPS
100.0000 mg | ORAL_CAPSULE | Freq: Two times a day (BID) | ORAL | Status: DC
  Administered 2020-08-11 – 2020-08-14 (×7): 100 mg via ORAL
  Filled 2020-08-11 (×7): qty 1

## 2020-08-11 MED ORDER — METOCLOPRAMIDE HCL 5 MG PO TABS: 5.0000 mg | ORAL_TABLET | Freq: Three times a day (TID) | ORAL | Status: DC | PRN

## 2020-08-11 MED ORDER — LACTATED RINGERS IV SOLN: INTRAVENOUS | Status: DC | PRN

## 2020-08-11 MED ORDER — EPHEDRINE SULFATE-NACL 50-0.9 MG/10ML-% IV SOSY
PREFILLED_SYRINGE | INTRAVENOUS | Status: DC | PRN
  Administered 2020-08-11: 10 mg via INTRAVENOUS

## 2020-08-11 MED ORDER — HYDROMORPHONE HCL 1 MG/ML IJ SOLN
INTRAMUSCULAR | Status: AC
  Filled 2020-08-11: qty 1

## 2020-08-11 MED ORDER — ASPIRIN EC 325 MG PO TBEC
325.0000 mg | DELAYED_RELEASE_TABLET | Freq: Every day | ORAL | Status: DC
  Administered 2020-08-12 – 2020-08-14 (×3): 325 mg via ORAL
  Filled 2020-08-11 (×3): qty 1

## 2020-08-11 MED ORDER — WATER FOR IRRIGATION, STERILE IR SOLN
Status: DC | PRN
  Administered 2020-08-11: 2000 mL

## 2020-08-11 MED ORDER — ONDANSETRON HCL 4 MG PO TABS: 4.0000 mg | ORAL_TABLET | Freq: Four times a day (QID) | ORAL | Status: DC | PRN

## 2020-08-11 MED ORDER — MIDAZOLAM HCL 2 MG/2ML IJ SOLN
INTRAMUSCULAR | Status: DC | PRN
  Administered 2020-08-11 (×2): 1 mg via INTRAVENOUS

## 2020-08-11 MED ORDER — METHOCARBAMOL 1000 MG/10ML IJ SOLN
500.0000 mg | Freq: Four times a day (QID) | INTRAVENOUS | Status: DC | PRN
  Administered 2020-08-11 – 2020-08-13 (×4): 500 mg via INTRAVENOUS
  Filled 2020-08-11 (×4): qty 500

## 2020-08-11 MED ORDER — KETOROLAC TROMETHAMINE 30 MG/ML IJ SOLN
INTRAMUSCULAR | Status: DC | PRN
  Administered 2020-08-11: 30 mg

## 2020-08-11 MED ORDER — MIDAZOLAM HCL 2 MG/2ML IJ SOLN
INTRAMUSCULAR | Status: AC
  Filled 2020-08-11: qty 2

## 2020-08-11 MED ORDER — OXYCODONE HCL 5 MG/5ML PO SOLN: 5.0000 mg | Freq: Once | ORAL | Status: DC | PRN

## 2020-08-11 MED ORDER — PROMETHAZINE HCL 25 MG/ML IJ SOLN: 6.2500 mg | INTRAMUSCULAR | Status: DC | PRN

## 2020-08-11 MED ORDER — PROPOFOL 500 MG/50ML IV EMUL
INTRAVENOUS | Status: DC | PRN
  Administered 2020-08-11: 75 ug/kg/min via INTRAVENOUS

## 2020-08-11 MED ORDER — SODIUM CHLORIDE (PF) 0.9 % IJ SOLN
INTRAMUSCULAR | Status: AC
  Filled 2020-08-11: qty 30

## 2020-08-11 MED ORDER — FENTANYL CITRATE (PF) 100 MCG/2ML IJ SOLN
INTRAMUSCULAR | Status: DC | PRN
  Administered 2020-08-11 (×2): 25 ug via INTRAVENOUS
  Administered 2020-08-11: 50 ug via INTRAVENOUS

## 2020-08-11 MED ORDER — FENTANYL CITRATE (PF) 100 MCG/2ML IJ SOLN
INTRAMUSCULAR | Status: AC
  Filled 2020-08-11: qty 2

## 2020-08-11 MED ORDER — BUPIVACAINE HCL 0.5 % IJ SOLN
INTRAMUSCULAR | Status: DC | PRN
  Administered 2020-08-11: 30 mL

## 2020-08-11 MED ORDER — PHENOL 1.4 % MT LIQD: 1.0000 | OROMUCOSAL | Status: DC | PRN

## 2020-08-11 MED ORDER — ONDANSETRON HCL 4 MG/2ML IJ SOLN: 4.0000 mg | Freq: Four times a day (QID) | INTRAMUSCULAR | Status: DC | PRN

## 2020-08-11 MED ORDER — SODIUM CHLORIDE 0.9 % IV BOLUS
500.0000 mL | Freq: Once | INTRAVENOUS | Status: AC
  Administered 2020-08-11: 500 mL via INTRAVENOUS

## 2020-08-11 MED ORDER — METOCLOPRAMIDE HCL 5 MG/ML IJ SOLN: 5.0000 mg | Freq: Three times a day (TID) | INTRAMUSCULAR | Status: DC | PRN

## 2020-08-11 MED ORDER — LIDOCAINE 2% (20 MG/ML) 5 ML SYRINGE
INTRAMUSCULAR | Status: DC | PRN
  Administered 2020-08-11: 20 mg via INTRAVENOUS

## 2020-08-11 MED ORDER — PROPOFOL 10 MG/ML IV BOLUS
INTRAVENOUS | Status: DC | PRN
  Administered 2020-08-11 (×3): 20 mg via INTRAVENOUS

## 2020-08-11 MED ORDER — KETOROLAC TROMETHAMINE 30 MG/ML IJ SOLN
INTRAMUSCULAR | Status: AC
  Filled 2020-08-11: qty 1

## 2020-08-11 MED ORDER — BUPIVACAINE-EPINEPHRINE (PF) 0.5% -1:200000 IJ SOLN
INTRAMUSCULAR | Status: AC
  Filled 2020-08-11: qty 30

## 2020-08-11 SURGICAL SUPPLY — 78 items
ADH SKN CLS APL DERMABOND .7 (GAUZE/BANDAGES/DRESSINGS) ×2
BAG DECANTER FOR FLEXI CONT (MISCELLANEOUS) IMPLANT
BAG SPEC THK2 15X12 ZIP CLS (MISCELLANEOUS)
BAG ZIPLOCK 12X15 (MISCELLANEOUS) IMPLANT
BALL HIP CERAMIC 32MM PLUS 9 IMPLANT
BLADE SURG SZ10 CARB STEEL (BLADE) IMPLANT
CHLORAPREP W/TINT 26 (MISCELLANEOUS) ×2 IMPLANT
COVER PERINEAL POST (MISCELLANEOUS) ×2 IMPLANT
COVER SURGICAL LIGHT HANDLE (MISCELLANEOUS) ×2 IMPLANT
COVER WAND RF STERILE (DRAPES) IMPLANT
CUP SECTOR GRIPTON 50MM (Cup) ×1 IMPLANT
DECANTER SPIKE VIAL GLASS SM (MISCELLANEOUS) ×2 IMPLANT
DERMABOND ADVANCED (GAUZE/BANDAGES/DRESSINGS) ×2
DERMABOND ADVANCED .7 DNX12 (GAUZE/BANDAGES/DRESSINGS) ×2 IMPLANT
DRAPE IMP U-DRAPE 54X76 (DRAPES) ×2 IMPLANT
DRAPE SHEET LG 3/4 BI-LAMINATE (DRAPES) ×6 IMPLANT
DRAPE STERI IOBAN 125X83 (DRAPES) IMPLANT
DRAPE U-SHAPE 47X51 STRL (DRAPES) ×4 IMPLANT
DRESSING AQUACEL AG SP 3.5X10 (GAUZE/BANDAGES/DRESSINGS) IMPLANT
DRSG AQUACEL AG ADV 3.5X10 (GAUZE/BANDAGES/DRESSINGS) ×2 IMPLANT
DRSG AQUACEL AG SP 3.5X10 (GAUZE/BANDAGES/DRESSINGS) ×2
ELECT REM PT RETURN 15FT ADLT (MISCELLANEOUS) ×2 IMPLANT
GAUZE SPONGE 4X4 12PLY STRL (GAUZE/BANDAGES/DRESSINGS) ×2 IMPLANT
GLOVE BIO SURGEON STRL SZ8.5 (GLOVE) ×4 IMPLANT
GLOVE BIOGEL PI IND STRL 7.0 (GLOVE) IMPLANT
GLOVE BIOGEL PI IND STRL 7.5 (GLOVE) IMPLANT
GLOVE BIOGEL PI IND STRL 8.5 (GLOVE) ×1 IMPLANT
GLOVE BIOGEL PI INDICATOR 7.0 (GLOVE) ×1
GLOVE BIOGEL PI INDICATOR 7.5 (GLOVE) ×5
GLOVE BIOGEL PI INDICATOR 8.5 (GLOVE) ×1
GLOVE SRG 8 PF TXTR STRL LF DI (GLOVE) ×1 IMPLANT
GLOVE SURG ENC MOIS LTX SZ7 (GLOVE) ×1 IMPLANT
GLOVE SURG ENC TEXT LTX SZ7.5 (GLOVE) ×4 IMPLANT
GLOVE SURG UNDER POLY LF SZ8 (GLOVE) ×2
GOWN SPEC L3 XXLG W/TWL (GOWN DISPOSABLE) ×2 IMPLANT
GOWN STRL REUS W/ TWL LRG LVL3 (GOWN DISPOSABLE) ×1 IMPLANT
GOWN STRL REUS W/ TWL XL LVL3 (GOWN DISPOSABLE) IMPLANT
GOWN STRL REUS W/TWL LRG LVL3 (GOWN DISPOSABLE) ×2
GOWN STRL REUS W/TWL XL LVL3 (GOWN DISPOSABLE) ×4
GUIDEPIN 3.2X17.5 THRD DISP (PIN) ×2 IMPLANT
HANDPIECE INTERPULSE COAX TIP (DISPOSABLE) ×2
HIP BALL CERAMIC 32MM PLUS 9 ×2 IMPLANT
HOLDER FOLEY CATH W/STRAP (MISCELLANEOUS) ×2 IMPLANT
HOOD PEEL AWAY FLYTE STAYCOOL (MISCELLANEOUS) ×8 IMPLANT
JET LAVAGE IRRISEPT WOUND (IRRIGATION / IRRIGATOR) ×2
KIT TURNOVER KIT A (KITS) IMPLANT
LAVAGE JET IRRISEPT WOUND (IRRIGATION / IRRIGATOR) ×1 IMPLANT
LINER ACET PNNCL PLUS4 NEUTRAL (Hips) IMPLANT
MANIFOLD NEPTUNE II (INSTRUMENTS) ×2 IMPLANT
MARKER SKIN DUAL TIP RULER LAB (MISCELLANEOUS) ×2 IMPLANT
NDL SAFETY ECLIPSE 18X1.5 (NEEDLE) ×1 IMPLANT
NDL SPNL 18GX3.5 QUINCKE PK (NEEDLE) ×1 IMPLANT
NEEDLE HYPO 18GX1.5 SHARP (NEEDLE) ×2
NEEDLE SPNL 18GX3.5 QUINCKE PK (NEEDLE) ×2 IMPLANT
PACK ANTERIOR HIP CUSTOM (KITS) ×2 IMPLANT
PENCIL SMOKE EVACUATOR (MISCELLANEOUS) IMPLANT
PINNACLE PLUS 4 NEUTRAL (Hips) ×2 IMPLANT
SAW OSC TIP CART 19.5X105X1.3 (SAW) ×2 IMPLANT
SCREW 6.5MMX35MM (Screw) ×1 IMPLANT
SEALER BIPOLAR AQUA 6.0 (INSTRUMENTS) ×2 IMPLANT
SET HNDPC FAN SPRY TIP SCT (DISPOSABLE) ×1 IMPLANT
STEM TRI LOC BPS SZ7 W GRIPTON (Hips) IMPLANT
SUT ETHIBOND NAB CT1 #1 30IN (SUTURE) ×4 IMPLANT
SUT MNCRL AB 3-0 PS2 18 (SUTURE) ×2 IMPLANT
SUT MNCRL AB 4-0 PS2 18 (SUTURE) ×2 IMPLANT
SUT MON AB 2-0 CT1 36 (SUTURE) ×5 IMPLANT
SUT MON AB 2-0 SH 27 (SUTURE) ×2
SUT MON AB 2-0 SH27 (SUTURE) IMPLANT
SUT STRATAFIX PDO 1 14 VIOLET (SUTURE) ×2
SUT STRATFX PDO 1 14 VIOLET (SUTURE) ×1
SUT VIC AB 2-0 CT1 27 (SUTURE) ×2
SUT VIC AB 2-0 CT1 TAPERPNT 27 (SUTURE) ×1 IMPLANT
SUTURE STRATFX PDO 1 14 VIOLET (SUTURE) ×1 IMPLANT
SYR 3ML LL SCALE MARK (SYRINGE) ×2 IMPLANT
TRAY FOLEY MTR SLVR 16FR STAT (SET/KITS/TRAYS/PACK) IMPLANT
TRI LOC BPS SZ 7 W GRIPTON (Hips) ×2 IMPLANT
TUBE SUCTION HIGH CAP CLEAR NV (SUCTIONS) ×2 IMPLANT
WATER STERILE IRR 1000ML POUR (IV SOLUTION) ×2 IMPLANT

## 2020-08-11 NOTE — Plan of Care (Signed)
  Problem: Education: Goal: Knowledge of General Education information will improve Description: Including pain rating scale, medication(s)/side effects and non-pharmacologic comfort measures Outcome: Progressing   Problem: Clinical Measurements: Goal: Cardiovascular complication will be avoided Outcome: Progressing   Problem: Nutrition: Goal: Adequate nutrition will be maintained Outcome: Progressing   

## 2020-08-11 NOTE — Progress Notes (Signed)
PROGRESS NOTE    Dorothy White  [id number] DOB: [birth year] DOA: 08/09/2020 PCP: Maurice Small, MD   Chief Complain: Fall followed by pain of the right hip  Brief Narrative: Patient is a 70 year old female with history of osteoarthritis, recurrent falls, left femur fracture in September 2021, GERD, basal cell carcinoma, anxiety disorder, hyperlipidemia who presented to the emergency department after a mechanical fall at home.  After fall ,she developed right hip pain and shortening of right lower extremity.  In the emergency department she was found to have displaced right femoral neck fracture.  Orthopedics consulted.  Planning for ORIF today.  Assessment & Plan:   Principal Problem:   Femur fracture, right (HCC) Active Problems:   Depression   GERD (gastroesophageal reflux disease)   DEGENERATION, CERVICAL DISC   Chronic pain syndrome   Recurrent falls   Acute right femoral neck fracture: Plan for surgery by orthopedics on Saturday.  Continue pain management, supportive care.  Continue SCD for DVT prophylaxis for now.  PT/OT evaluation after surgery  Hypokalemia: Supplemented with potassium  Chronic pain syndrome: Continue pain management.  Supportive care  GERD: Continue PPI  History of recurrent falls: She had a fracture of left femur in September 2021.  PT/OT consultation after surgery  Depression/anxiety: Continue home regimen         DVT prophylaxis: Lovenox Code Status: Full code Family Communication: None at bedside Status is: Inpatient  Remains inpatient appropriate because:Inpatient level of care appropriate due to severity of illness   Dispo: The patient is from: Home              Anticipated d/c is to: SNF              Anticipated d/c date is:2-3 days              Patient currently is not medically stable to d/c.    Consultants: Orthopedics  Procedures:None  Antimicrobials:  Anti-infectives (From admission, onward)   Start     Dose/Rate  Route Frequency Ordered Stop   08/11/20 0900  ceFAZolin (ANCEF) IVPB 2g/100 mL premix        2 g 200 mL/hr over 30 Minutes Intravenous On call to O.R. 08/10/20 2228 08/12/20 0559      Subjective: Patient seen and examined at bedside this morning.  Hemodynamically stable.  Appears comfortable.  Complains of right hip pain  Objective: Vitals:   08/10/20 0545 08/10/20 1311 08/10/20 2144 08/11/20 0504  BP: (!) 117/44 (!) 117/43 (!) 100/42 (!) 106/46  Pulse: 71 68 67 73  Resp: 15 16 16 15   Temp: 99 F (37.2 C) 98.4 F (36.9 C) 98.3 F (36.8 C) 97.7 F (36.5 C)  TempSrc: Oral Oral  Oral  SpO2: 93% 99% 92% 95%  Weight:      Height:        Intake/Output Summary (Last 24 hours) at 08/11/2020 [id number] Last data filed at 08/11/2020 0600 Gross per 24 hour  Intake 1816.77 ml  Output 400 ml  Net 1416.77 ml   Filed Weights   08/09/20 2140  Weight: 52.3 kg    Examination:  General exam: Appears calm and comfortable ,Not in distress,average built HEENT:PERRL,Oral mucosa moist, Ear/Nose normal on gross exam Respiratory system: Bilateral equal air entry, normal vesicular breath sounds, no wheezes or crackles  Cardiovascular system: S1 & S2 heard, RRR. No JVD, murmurs, rubs, gallops or clicks. Gastrointestinal system: Abdomen is nondistended, soft and nontender. No organomegaly or masses felt. Normal bowel  sounds heard. Central nervous system: Alert and oriented. No focal neurological deficits. Extremities: No edema, no clubbing ,no cyanosis, right lower extremity externally rotated, tenderness of the right hip  skin: No rashes, lesions or ulcers,no icterus ,no pallor   Data Reviewed: I have personally reviewed following labs and imaging studies  CBC: Recent Labs  Lab 08/09/20 1600 08/10/20 0341 08/11/20 0331  WBC 9.1 8.5 5.1  NEUTROABS 7.5  --  3.1  HGB 12.1 12.0 11.7*  HCT 39.6 40.4 39.6  MCV 85.3 87.3 88.0  PLT 235 157 962   Basic Metabolic Panel: Recent Labs  Lab  08/09/20 1600 08/10/20 0341 08/11/20 0331  NA 139 141 142  K 4.0 3.3* 3.8  CL 111 109 111  CO2 22 20* 22  GLUCOSE 85 100* 227*  BUN 12 9 6*  CREATININE 0.68 0.61 0.61  CALCIUM 7.9* 8.5* 8.6*   GFR: Estimated Creatinine Clearance: 54.8 mL/min (by C-G formula based on SCr of 0.61 mg/dL). Liver Function Tests: Recent Labs  Lab 08/10/20 0341  AST 11*  ALT 10  ALKPHOS 114  BILITOT 0.9  PROT 5.7*  ALBUMIN 3.2*   No results for input(s): LIPASE, AMYLASE in the last 168 hours. No results for input(s): AMMONIA in the last 168 hours. Coagulation Profile: Recent Labs  Lab 08/09/20 1600  INR 1.0   Cardiac Enzymes: No results for input(s): CKTOTAL, CKMB, CKMBINDEX, TROPONINI in the last 168 hours. BNP (last 3 results) No results for input(s): PROBNP in the last 8760 hours. HbA1C: No results for input(s): HGBA1C in the last 72 hours. CBG: No results for input(s): GLUCAP in the last 168 hours. Lipid Profile: No results for input(s): CHOL, HDL, LDLCALC, TRIG, CHOLHDL, LDLDIRECT in the last 72 hours. Thyroid Function Tests: No results for input(s): TSH, T4TOTAL, FREET4, T3FREE, THYROIDAB in the last 72 hours. Anemia Panel: No results for input(s): VITAMINB12, FOLATE, FERRITIN, TIBC, IRON, RETICCTPCT in the last 72 hours. Sepsis Labs: No results for input(s): PROCALCITON, LATICACIDVEN in the last 168 hours.  Recent Results (from the past 240 hour(s))  Resp Panel by RT-PCR (Flu A&B, Covid) Nasopharyngeal Swab     Status: None   Collection Time: 08/09/20  5:12 PM   Specimen: Nasopharyngeal Swab; Nasopharyngeal(NP) swabs in vial transport medium  Result Value Ref Range Status   SARS Coronavirus 2 by RT PCR NEGATIVE NEGATIVE Final    Comment: (NOTE) SARS-CoV-2 target nucleic acids are NOT DETECTED.  The SARS-CoV-2 RNA is generally detectable in upper respiratory specimens during the acute phase of infection. The lowest concentration of SARS-CoV-2 viral copies this assay can  detect is 138 copies/mL. A negative result does not preclude SARS-Cov-2 infection and should not be used as the sole basis for treatment or other patient management decisions. A negative result may occur with  improper specimen collection/handling, submission of specimen other than nasopharyngeal swab, presence of viral mutation(s) within the areas targeted by this assay, and inadequate number of viral copies(<138 copies/mL). A negative result must be combined with clinical observations, patient history, and epidemiological information. The expected result is Negative.  Fact Sheet for Patients:  EntrepreneurPulse.com.au  Fact Sheet for Healthcare Providers:  IncredibleEmployment.be  This test is no t yet approved or cleared by the [place] FDA and  has been authorized for detection and/or diagnosis of SARS-CoV-2 by FDA under an Emergency Use Authorization (EUA). This EUA will remain  in effect (meaning this test can be used) for the duration of the COVID-19 declaration under Section  564(b)(1) of the Act, 21 U.S.C.section 360bbb-3(b)(1), unless the authorization is terminated  or revoked sooner.       Influenza A by PCR NEGATIVE NEGATIVE Final   Influenza B by PCR NEGATIVE NEGATIVE Final    Comment: (NOTE) The Xpert Xpress SARS-CoV-2/FLU/RSV plus assay is intended as an aid in the diagnosis of influenza from Nasopharyngeal swab specimens and should not be used as a sole basis for treatment. Nasal washings and aspirates are unacceptable for Xpert Xpress SARS-CoV-2/FLU/RSV testing.  Fact Sheet for Patients: EntrepreneurPulse.com.au  Fact Sheet for Healthcare Providers: IncredibleEmployment.be  This test is not yet approved or cleared by the [place] FDA and has been authorized for detection and/or diagnosis of SARS-CoV-2 by FDA under an Emergency Use Authorization (EUA). This EUA will remain in  effect (meaning this test can be used) for the duration of the COVID-19 declaration under Section 564(b)(1) of the Act, 21 U.S.C. section 360bbb-3(b)(1), unless the authorization is terminated or revoked.  Performed at [facility], [place] [address], [place], [place] [address]   Surgical pcr screen     Status: Abnormal   Collection Time: 08/09/20 10:39 PM   Specimen: Nasal Mucosa; Nasal Swab  Result Value Ref Range Status   MRSA, PCR NEGATIVE NEGATIVE Final   Staphylococcus aureus POSITIVE (A) NEGATIVE Final    Comment: (NOTE) The Xpert SA Assay (FDA approved for NASAL specimens in patients 88 years of age and older), is one component of a comprehensive surveillance program. It is not intended to diagnose infection nor to guide or monitor treatment. Performed at [facility], [place] [address] [place], [place] [address]          Radiology Studies: DG Knee Right Port  Result Date: 08/10/2020 CLINICAL DATA:  Pain following trauma EXAM: PORTABLE RIGHT KNEE - 1-2 VIEW COMPARISON:  None. FINDINGS: Frontal and lateral views obtained. No fracture or dislocation. No joint effusion. No appreciable joint space narrowing. There is a spur along the anterior superior patella. No erosion. 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 No fracture or dislocation. No joint effusion. No appreciable joint space narrowing. Electronically Signed   By: Lowella Grip III M.D.   On: 08/10/2020 09:11   DG Hip Unilat With Pelvis 2-3 Views Right  Result Date: 08/09/2020 CLINICAL DATA:  70 year old female with right hip pain after falling EXAM: DG HIP (WITH OR WITHOUT PELVIS) 2-3V RIGHT COMPARISON:  None. FINDINGS: Acute fracture through the right femoral neck consistent with a transcervical fracture. The femoral head remains located. Incompletely imaged surgical changes of left total hip arthroplasty. Bony  pelvis is intact. Left greater than right facet arthropathy at L4-L5 and L5-S1. IMPRESSION: Acute right transcervical femoral neck fracture. Electronically Signed   By: Jacqulynn Cadet M.D.   On: 08/09/2020 16:47        Scheduled Meds: . Chlorhexidine Gluconate Cloth  6 each Topical Daily  . FLUoxetine  80 mg Oral Daily  . gabapentin  200 mg Oral TID  . mupirocin ointment  1 application Nasal BID  . pantoprazole  40 mg Oral Daily  . povidone-iodine  2 application Topical Once  . traZODone  200 mg Oral QHS   Continuous Infusions: .  ceFAZolin (ANCEF) IV    . dextrose 5 % and 0.9% NaCl 50 mL/hr at 08/11/20 0600  . tranexamic acid       LOS: 2 days    Time spent: 25 mins.More than  50% of that time was spent in counseling and/or coordination of care.      Shelly Coss, MD [facility] P1/15/2022, 8:21 AM

## 2020-08-11 NOTE — Anesthesia Procedure Notes (Signed)
Arterial Line Insertion Start/End1/15/2022 11:48 AM Performed by: Pervis Hocking, DO  Patient location: OR. Preanesthetic checklist: patient identified, IV checked, monitors and equipment checked and timeout performed radial was placed Catheter size: 20 G  Attempts: 1 Procedure performed without using ultrasound guided technique. Following insertion, Biopatch and dressing applied. Post procedure assessment: normal  Patient tolerated the procedure well with no immediate complications. Additional procedure comments: BY Finacane MDA.

## 2020-08-11 NOTE — Anesthesia Preprocedure Evaluation (Signed)
Anesthesia Evaluation  Patient identified by MRN, date of birth, ID band Patient awake    Reviewed: Allergy & Precautions, NPO status , Patient's Chart, lab work & pertinent test results  Airway Mallampati: II  TM Distance: >3 FB Neck ROM: Full    Dental no notable dental hx.    Pulmonary Current Smoker,  9 pack year history-    Pulmonary exam normal breath sounds clear to auscultation       Cardiovascular + CAD  Normal cardiovascular exam Rhythm:Regular Rate:Normal  Last echo 06/2018:  - Procedure narrative: Transthoracic echocardiography. Image  quality was adequate. The study was technically difficult.  - Left ventricle: The cavity size was normal. Systolic function was  normal. The estimated ejection fraction was in the range of 60%  to 65%. Mild basal septal hypertrophy. Wall motion was normal;  there were no regional wall motion abnormalities. Left  ventricular diastolic function parameters were normal.  - Atrial septum: No defect or patent foramen ovale was identified.  - Inferior vena cava: The vessel was dilated. The respirophasic  diameter changes were blunted (< 50%), consistent with elevated  central venous pressure. Estimated CVP 15 mmHg.   Stress test 2017: Myocardial perfusion is abnormal.   There is a medium defect of moderate severity present in the basal inferoseptal, mid inferoseptal and apical anterior location. The defect is non-reversible.  No ischemia noted.  Cath 2017:  Mid LAD lesion, 25 %stenosed.  The left ventricular systolic function is normal.  LV end diastolic pressure is normal.  The left ventricular ejection fraction is 55-65% by visual estimate.  There is no aortic valve stenosis.     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)   GI/Hepatic Neg liver ROS, GERD  Medicated and Controlled,  Endo/Other   negative endocrine ROS  Renal/GU negative Renal ROS  negative genitourinary   Musculoskeletal  (+) Arthritis , Osteoarthritis,  Right hip fx   Abdominal   Peds  Hematology negative hematology ROS (+) hct 39.6, plt 173   Anesthesia Other Findings   Reproductive/Obstetrics negative OB ROS                             Anesthesia Physical Anesthesia Plan  ASA: II  Anesthesia Plan: Spinal and MAC   Post-op Pain Management:    Induction:   PONV Risk Score and Plan: 2 and Propofol infusion and TIVA  Airway Management Planned: Natural Airway and Nasal Cannula  Additional Equipment: None  Intra-op Plan:   Post-operative Plan:   Informed Consent: I have reviewed the patients History and Physical, chart, labs and discussed the procedure including the risks, benefits and alternatives for the proposed anesthesia with the patient or authorized representative who has indicated his/her understanding and acceptance.       Plan Discussed with: CRNA  Anesthesia Plan Comments:         Anesthesia Quick Evaluation

## 2020-08-11 NOTE — Discharge Instructions (Signed)
°Dr. Lillianna Sabel °Joint Replacement Specialist °[facility] °[address] °, [place] [address] °[phone] ° ° °TOTAL HIP REPLACEMENT POSTOPERATIVE DIRECTIONS ° ° ° °Hip Rehabilitation, Guidelines Following Surgery  ° °WEIGHT BEARING °Weight bearing as tolerated with assist device (walker, cane, etc) as directed, use it as long as suggested by your surgeon or therapist, typically at least 4-6 weeks. ° °The results of a hip operation are greatly improved after range of motion and muscle strengthening exercises. Follow all safety measures which are given to protect your hip. If any of these exercises cause increased pain or swelling in your joint, decrease the amount until you are comfortable again. Then slowly increase the exercises. Call your caregiver if you have problems or questions.  ° °HOME CARE INSTRUCTIONS  °Most of the following instructions are designed to prevent the dislocation of your new hip.  °Remove items at home which could result in a fall. This includes throw rugs or furniture in walking pathways.  °Continue medications as instructed at time of discharge. °· You may have some home medications which will be placed on hold until you complete the course of blood thinner medication. °· You may start showering once you are discharged home. Do not remove your dressing. °Do not put on socks or shoes without following the instructions of your caregivers.   °Sit on chairs with arms. Use the chair arms to help push yourself up when arising.  °Arrange for the use of a toilet seat elevator so you are not sitting low.  °· Walk with walker as instructed.  °You may resume a sexual relationship in one month or when given the OK by your caregiver.  °Use walker as long as suggested by your caregivers.  °You may put full weight on your legs and walk as much as is comfortable. °Avoid periods of inactivity such as sitting longer than an hour when not asleep. This helps prevent  blood clots.  °You may return to work once you are cleared by your surgeon.  °Do not drive a car for 6 weeks or until released by your surgeon.  °Do not drive while taking narcotics.  °Wear elastic stockings for two weeks following surgery during the day but you may remove then at night.  °Make sure you keep all of your appointments after your operation with all of your doctors and caregivers. You should call the office at the above phone number and make an appointment for approximately two weeks after the date of your surgery. °Please pick up a stool softener and laxative for home use as long as you are requiring pain medications. °· ICE to the affected hip every three hours for 30 minutes at a time and then as needed for pain and swelling. Continue to use ice on the hip for pain and swelling from surgery. You may notice swelling that will progress down to the foot and ankle.  This is normal after surgery.  Elevate the leg when you are not up walking on it.   °It is important for you to complete the blood thinner medication as prescribed by your doctor. °· Continue to use the breathing machine which will help keep your temperature down.  It is common for your temperature to cycle up and down following surgery, especially at night when you are not up moving around and exerting yourself.  The breathing machine keeps your lungs expanded and your temperature down. ° °RANGE OF MOTION AND STRENGTHENING EXERCISES  °These exercises are   designed to help you keep full movement of your hip joint. Follow your caregiver's or physical therapist's instructions. Perform all exercises about fifteen times, three times per day or as directed. Exercise both hips, even if you have had only one joint replacement. These exercises can be done on a training (exercise) mat, on the floor, on a table or on a bed. Use whatever works the best and is most comfortable for you. Use music or television while you are exercising so that the exercises  are a pleasant break in your day. This will make your life better with the exercises acting as a break in routine you can look forward to.  °Lying on your back, slowly slide your foot toward your buttocks, raising your knee up off the floor. Then slowly slide your foot back down until your leg is straight again.  °Lying on your back spread your legs as far apart as you can without causing discomfort.  °Lying on your side, raise your upper leg and foot straight up from the floor as far as is comfortable. Slowly lower the leg and repeat.  °Lying on your back, tighten up the muscle in the front of your thigh (quadriceps muscles). You can do this by keeping your leg straight and trying to raise your heel off the floor. This helps strengthen the largest muscle supporting your knee.  °Lying on your back, tighten up the muscles of your buttocks both with the legs straight and with the knee bent at a comfortable angle while keeping your heel on the floor.  ° °SKILLED REHAB INSTRUCTIONS: °If the patient is transferred to a skilled rehab facility following release from the hospital, a list of the current medications will be sent to the facility for the patient to continue.  When discharged from the skilled rehab facility, please have the facility set up the patient's Home Health Physical Therapy prior to being released. Also, the skilled facility will be responsible for providing the patient with their medications at time of release from the facility to include their pain medication and their blood thinner medication. If the patient is still at the rehab facility at time of the two week follow up appointment, the skilled rehab facility will also need to assist the patient in arranging follow up appointment in our office and any transportation needs. ° °MAKE SURE YOU:  °Understand these instructions.  °Will watch your condition.  °Will get help right away if you are not doing well or get worse. ° °Pick up stool softner and  laxative for home use following surgery while on pain medications. °Do not remove your dressing. °The dressing is waterproof--it is OK to take showers. °Continue to use ice for pain and swelling after surgery. °Do not use any lotions or creams on the incision until instructed by your surgeon. °Total Hip Protocol. ° ° °

## 2020-08-11 NOTE — Anesthesia Postprocedure Evaluation (Signed)
Anesthesia Post Note  Patient: [name]  Procedure(s) Performed: TOTAL HIP ARTHROPLASTY ANTERIOR APPROACH (Right Hip)     Patient location during evaluation: PACU Anesthesia Type: MAC and Spinal Level of consciousness: awake and alert and oriented Pain management: pain level controlled Vital Signs Assessment: post-procedure vital signs reviewed and stable Respiratory status: spontaneous breathing, nonlabored ventilation and respiratory function stable Cardiovascular status: blood pressure returned to baseline and stable Postop Assessment: no headache, no backache, spinal receding and no apparent nausea or vomiting Anesthetic complications: no   No complications documented.  Last Vitals:  Vitals:   08/11/20 1425 08/11/20 1430  BP:  (!) 118/52  Pulse: 69 72  Resp: 11 (!) 8  Temp:    SpO2: 100% 100%    Last Pain:  Vitals:   08/11/20 1430  TempSrc:   PainSc: [place]

## 2020-08-11 NOTE — Transfer of Care (Signed)
Immediate Anesthesia Transfer of Care Note  Patient: Dorothy White  Procedure(s) Performed: TOTAL HIP ARTHROPLASTY ANTERIOR APPROACH (Right Hip)  Patient Location: PACU  Anesthesia Type:Spinal  Level of Consciousness: awake and alert   Airway & Oxygen Therapy: Patient Spontanous Breathing and Patient connected to face mask oxygen  Post-op Assessment: Report given to RN and Post -op Vital signs reviewed and stable  Post vital signs: Reviewed and stable  Last Vitals:  Vitals Value Taken Time  BP 106/44 08/11/20 1317  Temp    Pulse 82 08/11/20 1319  Resp 9 08/11/20 1319  SpO2 99 % 08/11/20 1319  Vitals shown include unvalidated device data.  Last Pain:  Vitals:   08/11/20 0903  TempSrc:   PainSc: 5       Patients Stated Pain Goal: 3 (37/29/02 1115)  Complications: No complications documented.

## 2020-08-11 NOTE — Anesthesia Procedure Notes (Signed)
Spinal  Patient location during procedure: OR Start time: 08/11/2020 10:45 AM End time: 08/11/2020 10:50 AM Staffing Performed: anesthesiologist  Anesthesiologist: Pervis Hocking, DO Preanesthetic Checklist Completed: patient identified, IV checked, risks and benefits discussed, surgical consent, monitors and equipment checked, pre-op evaluation and timeout performed Spinal Block Patient position: right lateral decubitus Prep: DuraPrep and site prepped and draped Patient monitoring: cardiac monitor, continuous pulse ox and blood pressure Approach: midline Location: L3-4 Injection technique: single-shot Needle Needle type: Pencan  Needle gauge: 24 G Needle length: 9 cm Assessment Sensory level: T6 Additional Notes Functioning IV was confirmed and monitors were applied. Sterile prep and drape, including hand hygiene and sterile gloves were used. The patient was positioned and the spine was prepped. The skin was anesthetized with lidocaine.  Free flow of clear CSF was obtained prior to injecting local anesthetic into the CSF.  The spinal needle aspirated freely following injection.  The needle was carefully withdrawn.  The patient tolerated the procedure well.

## 2020-08-11 NOTE — Anesthesia Procedure Notes (Signed)
Date/Time: 08/11/2020 10:33 AM Performed by: Cynda Familia, CRNA Pre-anesthesia Checklist: Patient identified, Emergency Drugs available, Suction available, Patient being monitored and Timeout performed Patient Re-evaluated:Patient Re-evaluated prior to induction Placement Confirmation: positive ETCO2 and breath sounds checked- equal and bilateral Dental Injury: Teeth and Oropharynx as per pre-operative assessment

## 2020-08-11 NOTE — Plan of Care (Signed)
  Problem: Coping: Goal: Level of anxiety will decrease Outcome: Progressing   Problem: Pain Managment: Goal: General experience of comfort will improve Outcome: Progressing   Problem: Pain Management: Goal: Pain level will decrease with appropriate interventions Outcome: Progressing

## 2020-08-11 NOTE — Progress Notes (Signed)
   08/11/20 1808  Assess: MEWS Score  Temp 98.4 F (36.9 C)  BP (!) 96/46  Pulse Rate 92  Resp 17  SpO2 95 %  Assess: MEWS Score  MEWS Temp 0  MEWS Systolic 1  MEWS Pulse 0  MEWS RR 0  MEWS LOC 0  MEWS Score 1  MEWS Score Color Green  Assessed Pt, she is sleepy but easily aroused. Denies pain. MD is notified via secure chat, new orders received.

## 2020-08-11 NOTE — Op Note (Signed)
OPERATIVE REPORT  SURGEON: Rod Can, MD   ASSISTANT: Cherlynn June, PA-C  PREOPERATIVE DIAGNOSIS: Displaced Right femoral neck fracture.   POSTOPERATIVE DIAGNOSIS: Displaced Right femoral neck fracture.   PROCEDURE: Right total hip arthroplasty, anterior approach.   IMPLANTS: DePuy Tri Lock stem, size 7, hi offset. DePuy Pinnacle Cup, size 50 mm. DePuy Altrx liner, size 32 by 50 mm, +4 neutral. DePuy Biolox ceramic head ball, size 32 + 9 mm. 6.5 mm cancellous bone screw x1.  ANESTHESIA:  MAC and Spinal  ANTIBIOTICS: 2g ancef.  ESTIMATED BLOOD LOSS:-1000 mL    DRAINS: None.  COMPLICATIONS: None  FINDINGS: Significant oozing from surgical bed, especially cancellous bone, during surgery. Well controlled at the end of the case.  BLOOD PRODUCTS: 2 units packed red blood cells.   CONDITION: PACU - hemodynamically stable.   BRIEF CLINICAL NOTE: Dorothy White is a 70 y.o. female with a displaced Right femoral neck fracture. The patient was admitted to the hospitalist service and underwent perioperative risk stratification and medical optimization. The risks, benefits, and alternatives to total hip arthroplasty were explained, and the patient elected to proceed.  PROCEDURE IN DETAIL: The patient was taken to the operating room and general anesthesia was induced on the hospital bed.  The patient was then positioned on the Hana table.  All bony prominences were well padded.  The hip was prepped and draped in the normal sterile surgical fashion.  A time-out was called verifying side and site of surgery. Antibiotics were given within 60 minutes of beginning the procedure.  The direct anterior approach to the hip was performed through the Hueter interval.  Lateral femoral circumflex vessels were treated with the Auqumantys. The anterior capsule was exposed and an inverted T capsulotomy was made.  Fracture hematoma was encountered and evacuated. The patient was found to  have a comminuted Right subcapital femoral neck fracture.  I freshened the femoral neck cut with a saw.  I removed the femoral neck fragment.  A corkscrew was placed into the head and the head was removed.  This was passed to the back table and was measured.   Acetabular exposure was achieved, and the pulvinar and labrum were excised. Sequential reaming of the acetabulum was then performed up to a size 49 mm reamer. A 50 mm cup was then opened and impacted into place at approximately 40 degrees of abduction and 20 degrees of anteversion.  I elected to augment the already acceptable press-fit fixation with a single 6.5 mm cancellous bone screw.  The final polyethylene liner was impacted into place.    I then gained femoral exposure taking care to protect the abductors and greater trochanter.  This was performed using standard external rotation, extension, and adduction.  The capsule was peeled off the inner aspect of the greater trochanter, taking care to preserve the short external rotators. A cookie cutter was used to enter the femoral canal, and then the femoral canal finder was used to confirm location.  I then sequentially broached up to a size 7.  Calcar planer was used on the femoral neck remnant.  I paced a hi neck and a trial head ball. The hip was reduced.  Leg lengths were checked fluoroscopically.  The hip was dislocated and trial components were removed.  I placed the real stem followed by the real spacer and head ball.  The hip was reduced.  Fluoroscopy was used to confirm component position and leg lengths.  At 90 degrees of external rotation and  extension, the hip was stable to an anterior directed force.   The wound was copiously irrigated with Irrisept solution and normal saline using pule lavage.  Marcaine solution was injected into the periarticular soft tissue.  The wound was closed in layers using #1 Vicryl and V-Loc for the fascia, 2-0 Vicryl for the subcutaneous fat, 2-0 Monocryl for the  deep dermal layer, and staples plus glue for the skin.  Once the glue was fully dried, an Aquacell Ag dressing was applied.  The patient was then awakened from anesthesia and transported to the recovery room in stable condition.  Sponge, needle, and instrument counts were correct at the end of the case x2.  The patient tolerated the procedure well and there were no known complications.  Please note that there was significant oozing from the surgical bed throughout the surgery, especially the cancellous bone surfaces.  Once the implants were placed, hemostasis was much improved.  Please note that a surgical assistant was a medical necessity for this procedure to perform it in a safe and expeditious manner. Assistant was necessary to provide appropriate retraction of vital neurovascular structures, to prevent femoral fracture, and to allow for anatomic placement of the prosthesis.  Postoperatively, the patient be readmitted to the hospitalist.  She may weight-bear as tolerated right lower extremity with a walker.  Due to the risk of bleeding, postop DVT prophylaxis will be with aspirin for 6 weeks.  Mobilize out of bed with PT/OT.  She will undergo disposition planning.  Follow-up in the office in 2 weeks for routine postoperative care.

## 2020-08-11 NOTE — Interval H&P Note (Signed)
History and Physical Interval Note:  08/11/2020 10:28 AM  Dorothy White  has presented today for surgery, with the diagnosis of RIGHT HIP FRACTURE.  The various methods of treatment have been discussed with the patient and family. After consideration of risks, benefits and other options for treatment, the patient has consented to  Procedure(s): TOTAL HIP ARTHROPLASTY ANTERIOR APPROACH (Right) as a surgical intervention.  The patient's history has been reviewed, patient examined, no change in status, stable for surgery.  I have reviewed the patient's chart and labs.  Questions were answered to the patient's satisfaction.    The risks, benefits, and alternatives were discussed with the patient. There are risks associated with the surgery including, but not limited to, problems with anesthesia (death), infection, instability (giving out of the joint), dislocation, differences in leg length/angulation/rotation, fracture of bones, loosening or failure of implants, hematoma (blood accumulation) which may require surgical drainage, blood clots, pulmonary embolism, nerve injury (foot drop and lateral thigh numbness), and blood vessel injury. The patient understands these risks and elects to proceed.    Hilton Cork Kayani Rapaport

## 2020-08-12 ENCOUNTER — Inpatient Hospital Stay (HOSPITAL_COMMUNITY): Payer: Medicare Other

## 2020-08-12 LAB — CBC
HCT: 34.3 % — ABNORMAL LOW (ref 36.0–46.0)
Hemoglobin: 11 g/dL — ABNORMAL LOW (ref 12.0–15.0)
MCH: 27.4 pg (ref 26.0–34.0)
MCHC: 32.1 g/dL (ref 30.0–36.0)
MCV: 85.5 fL (ref 80.0–100.0)
Platelets: 131 10*3/uL — ABNORMAL LOW (ref 150–400)
RBC: 4.01 MIL/uL (ref 3.87–5.11)
RDW: 14.9 % (ref 11.5–15.5)
WBC: 7.8 10*3/uL (ref 4.0–10.5)
nRBC: 0 % (ref 0.0–0.2)

## 2020-08-12 LAB — BASIC METABOLIC PANEL
Anion gap: 11 (ref 5–15)
BUN: 8 mg/dL (ref 8–23)
CO2: 21 mmol/L — ABNORMAL LOW (ref 22–32)
Calcium: 8.1 mg/dL — ABNORMAL LOW (ref 8.9–10.3)
Chloride: 106 mmol/L (ref 98–111)
Creatinine, Ser: 0.59 mg/dL (ref 0.44–1.00)
GFR, Estimated: >60 mL/min (ref >60)
Glucose, Bld: 105 mg/dL — ABNORMAL HIGH (ref 70–99)
Potassium: 4.1 mmol/L (ref 3.5–5.1)
Sodium: 138 mmol/L (ref 135–145)

## 2020-08-12 LAB — BPAM RBC
Blood Product Expiration Date: 202202142359
ISSUE DATE / TIME: 202201151154
Unit Type and Rh: 5100

## 2020-08-12 LAB — TYPE AND SCREEN
ABO/RH(D): O POS
Antibody Screen: NEGATIVE
Unit division: 0

## 2020-08-12 MED ORDER — SODIUM CHLORIDE 0.9 % IV BOLUS
500.0000 mL | Freq: Once | INTRAVENOUS | Status: AC
  Administered 2020-08-12: 500 mL via INTRAVENOUS

## 2020-08-12 NOTE — Progress Notes (Signed)
PROGRESS NOTE    Dorothy White  EVO:[id number] DOB: [birth year] DOA: 08/09/2020 PCP: Maurice Small, MD   Chief Complain: Fall followed by pain of the right hip  Brief Narrative: Patient is a 70 year old female with history of osteoarthritis, recurrent falls, left femur fracture in September 2021, GERD, basal cell carcinoma, anxiety disorder, hyperlipidemia who presented to the emergency department after a mechanical fall at home.  After fall ,she developed right hip pain and shortening of right lower extremity.  In the emergency department she was found to have displaced right femoral neck fracture.  Orthopedics consulted.  Underwent right total hip arthroplasty on 08/11/2020.  PT/OT consulted and recommended SNF on DC.  Assessment & Plan:   Principal Problem:   Femur fracture, right (HCC) Active Problems:   Depression   GERD (gastroesophageal reflux disease)   DEGENERATION, CERVICAL DISC   Chronic pain syndrome   Recurrent falls   Acute right femoral neck fracture:   Underwent right total hip arthroplasty on 08/11/2020.  PT/OT consulted continue pain management, supportive care.  Started on aspirin for DVT prophylaxis  Post surgery hypotension: Most likely associated with anesthesia effect.  Given a bolus of 500 ml of normal saline and started on continuous Infusion.  Currently blood pressure soft but  stable  Fever: Error?  She does not have any leukocytosis.  Continue monitor temperature.  Will check chest x-ray.  She is also requiring 2 L of oxygen per minute.  Rule out aspiration pneumonia  Hypokalemia: Supplemented with potassium  Chronic pain syndrome: Continue pain management.  Supportive care  GERD: Continue PPI  History of recurrent falls: She had a fracture of left femur in September 2021.  PT/OT recommending SNF   Depression/anxiety: Continue home regimen         DVT prophylaxis: Lovenox Code Status: Full code Family Communication: None at bedside Status  is: Inpatient  Remains inpatient appropriate because:Inpatient level of care appropriate due to severity of illness   Dispo: The patient is from: Home              Anticipated d/c is to: SNF              Anticipated d/c date is:2-3 days              Patient currently is not medically stable to d/c.    Consultants: Orthopedics  Procedures:None  Antimicrobials:  Anti-infectives (From admission, onward)   Start     Dose/Rate Route Frequency Ordered Stop   08/11/20 1700  ceFAZolin (ANCEF) IVPB 2g/100 mL premix        2 g 200 mL/hr over 30 Minutes Intravenous Every 6 hours 08/11/20 1610 08/11/20 2346   08/11/20 0955  ceFAZolin (ANCEF) 2-4 GM/100ML-% IVPB       Note to Pharmacy: Octavio Graves   : cabinet override      08/11/20 0955 08/11/20 1120   08/11/20 0900  ceFAZolin (ANCEF) IVPB 2g/100 mL premix        2 g 200 mL/hr over 30 Minutes Intravenous On call to O.R. 08/10/20 2228 08/11/20 1121      Subjective: Patient seen and examined the bedside this morning.  When I entered the room, she was sitting on the chair.  Looks drowsy but denies any complaints other than pain on the operated area  Objective: Vitals:   08/11/20 1905 08/11/20 2026 08/12/20 0053 08/12/20 0534  BP: (!) 97/46 125/74 120/60 (!) 143/51  Pulse: 88 85 79 83  Resp: 14  12 16 16   Temp: 99 F (37.2 C) 99 F (37.2 C) 98 F (36.7 C) (!) 101.2 F (38.4 C)  TempSrc: Oral Oral Oral   SpO2: 97% 93% 93% 96%  Weight:      Height:        Intake/Output Summary (Last 24 hours) at 08/12/2020 0820 Last data filed at 08/12/2020 0600 Gross per 24 hour  Intake 4237.23 ml  Output 2250 ml  Net 1987.23 ml   Filed Weights   08/09/20 2140  Weight: 52.3 kg    Examination:  General exam: Appears calm and comfortable ,Not in distress,average built HEENT:PERRL,Oral mucosa moist, Ear/Nose normal on gross exam Respiratory system: Bilateral equal air entry, normal vesicular breath sounds, no wheezes or crackles   Cardiovascular system: S1 & S2 heard, RRR. No JVD, murmurs, rubs, gallops or clicks. Gastrointestinal system: Abdomen is nondistended, soft and nontender. No organomegaly or masses felt. Normal bowel sounds heard. Central nervous system: Alert and oriented. No focal neurological deficits. Extremities: No edema, no clubbing ,no cyanosis, clean surgical wound on the right hip Skin: No rashes, lesions or ulcers,no icterus ,no pallor    Data Reviewed: I have personally reviewed following labs and imaging studies  CBC: Recent Labs  Lab 08/09/20 1600 08/10/20 0341 08/11/20 0331 08/11/20 1205 08/11/20 1241 08/11/20 1400 08/12/20 0058  WBC 9.1 8.5 5.1  --   --  8.1 7.8  NEUTROABS 7.5  --  3.1  --   --   --   --   HGB 12.1 12.0 11.7* 9.2* 8.2* 10.1* 11.0*  HCT 39.6 40.4 39.6 27.0* 24.0* 32.7* 34.3*  MCV 85.3 87.3 88.0  --   --  88.9 85.5  PLT 235 157 173  --   --  134* [id number]*   Basic Metabolic Panel: Recent Labs  Lab 08/09/20 1600 08/10/20 0341 08/11/20 0331 08/11/20 1205 08/11/20 1241 08/12/20 0058  NA 139 141 142 143 144 138  K 4.0 3.3* 3.8 3.8 3.8 4.1  CL 111 109 111  --   --  106  CO2 22 20* 22  --   --  21*  GLUCOSE 85 100* 227*  --   --  105*  BUN 12 9 6*  --   --  8  CREATININE 0.68 0.61 0.61  --   --  0.59  CALCIUM 7.9* 8.5* 8.6*  --   --  8.1*   GFR: Estimated Creatinine Clearance: 54.8 mL/min (by C-G formula based on SCr of 0.59 mg/dL). Liver Function Tests: Recent Labs  Lab 08/10/20 0341  AST 11*  ALT 10  ALKPHOS 114  BILITOT 0.9  PROT 5.7*  ALBUMIN 3.2*   No results for input(s): LIPASE, AMYLASE in the last 168 hours. No results for input(s): AMMONIA in the last 168 hours. Coagulation Profile: Recent Labs  Lab 08/09/20 1600  INR 1.0   Cardiac Enzymes: No results for input(s): CKTOTAL, CKMB, CKMBINDEX, TROPONINI in the last 168 hours. BNP (last 3 results) No results for input(s): PROBNP in the last 8760 hours. HbA1C: No results for input(s):  HGBA1C in the last 72 hours. CBG: No results for input(s): GLUCAP in the last 168 hours. Lipid Profile: No results for input(s): CHOL, HDL, LDLCALC, TRIG, CHOLHDL, LDLDIRECT in the last 72 hours. Thyroid Function Tests: No results for input(s): TSH, T4TOTAL, FREET4, T3FREE, THYROIDAB in the last 72 hours. Anemia Panel: No results for input(s): VITAMINB12, FOLATE, FERRITIN, TIBC, IRON, RETICCTPCT in the last 72 hours. Sepsis Labs: No results  for input(s): PROCALCITON, LATICACIDVEN in the last 168 hours.  Recent Results (from the past 240 hour(s))  Resp Panel by RT-PCR (Flu A&B, Covid) Nasopharyngeal Swab     Status: None   Collection Time: 08/09/20  5:12 PM   Specimen: Nasopharyngeal Swab; Nasopharyngeal(NP) swabs in vial transport medium  Result Value Ref Range Status   SARS Coronavirus 2 by RT PCR NEGATIVE NEGATIVE Final    Comment: (NOTE) SARS-CoV-2 target nucleic acids are NOT DETECTED.  The SARS-CoV-2 RNA is generally detectable in upper respiratory specimens during the acute phase of infection. The lowest concentration of SARS-CoV-2 viral copies this assay can detect is 138 copies/mL. A negative result does not preclude SARS-Cov-2 infection and should not be used as the sole basis for treatment or other patient management decisions. A negative result may occur with  improper specimen collection/handling, submission of specimen other than nasopharyngeal swab, presence of viral mutation(s) within the areas targeted by this assay, and inadequate number of viral copies(<138 copies/mL). A negative result must be combined with clinical observations, patient history, and epidemiological information. The expected result is Negative.  Fact Sheet for Patients:  EntrepreneurPulse.com.au  Fact Sheet for Healthcare Providers:  IncredibleEmployment.be  This test is no t yet approved or cleared by the [place] FDA and  has been authorized for  detection and/or diagnosis of SARS-CoV-2 by FDA under an Emergency Use Authorization (EUA). This EUA will remain  in effect (meaning this test can be used) for the duration of the COVID-19 declaration under Section 564(b)(1) of the Act, 21 U.S.C.section 360bbb-3(b)(1), unless the authorization is terminated  or revoked sooner.       Influenza A by PCR NEGATIVE NEGATIVE Final   Influenza B by PCR NEGATIVE NEGATIVE Final    Comment: (NOTE) The Xpert Xpress SARS-CoV-2/FLU/RSV plus assay is intended as an aid in the diagnosis of influenza from Nasopharyngeal swab specimens and should not be used as a sole basis for treatment. Nasal washings and aspirates are unacceptable for Xpert Xpress SARS-CoV-2/FLU/RSV testing.  Fact Sheet for Patients: EntrepreneurPulse.com.au  Fact Sheet for Healthcare Providers: IncredibleEmployment.be  This test is not yet approved or cleared by the [place] FDA and has been authorized for detection and/or diagnosis of SARS-CoV-2 by FDA under an Emergency Use Authorization (EUA). This EUA will remain in effect (meaning this test can be used) for the duration of the COVID-19 declaration under Section 564(b)(1) of the Act, 21 U.S.C. section 360bbb-3(b)(1), unless the authorization is terminated or revoked.  Performed at [facility], [place] [address] [place], [place] [address]   Surgical pcr screen     Status: Abnormal   Collection Time: 08/09/20 10:39 PM   Specimen: Nasal Mucosa; Nasal Swab  Result Value Ref Range Status   MRSA, PCR NEGATIVE NEGATIVE Final   Staphylococcus aureus POSITIVE (A) NEGATIVE Final    Comment: (NOTE) The Xpert SA Assay (FDA approved for NASAL specimens in patients 80 years of age and older), is one component of a comprehensive surveillance program. It is not intended to diagnose infection nor to guide or monitor treatment. Performed at [facility],  [place] [address], [place], [place] [address]          Radiology Studies: Pelvis Portable  Result Date: 08/11/2020 CLINICAL DATA:  S/P right total hip replacement. Image done in PACU. EXAM: PORTABLE PELVIS 1-2 VIEWS COMPARISON:  Right hip radiographs 08/09/2020 FINDINGS: Status post right hip replacement with appropriate positioning and intact appearance of the hardware.  Patient also has a left hip prosthesis. No new acute finding in the bony pelvis. There is postoperative air in the regional soft tissues and overlying skin staple line. IMPRESSION: Status post right hip replacement without evidence of complication. For Electronically Signed   By: Audie Pinto M.D.   On: 08/11/2020 14:00   DG Knee Right Port  Result Date: 08/10/2020 CLINICAL DATA:  Pain following trauma EXAM: PORTABLE RIGHT KNEE - 1-2 VIEW COMPARISON:  None. FINDINGS: Frontal and lateral views obtained. No fracture or dislocation. No joint effusion. No appreciable joint space narrowing. There is a spur along the anterior superior patella. No erosion. 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 No fracture or dislocation. No joint effusion. No appreciable joint space narrowing. Electronically Signed   By: Lowella Grip III M.D.   On: 08/10/2020 09:11   DG C-Arm 1-60 Min-No Report  Result Date: 08/11/2020 Fluoroscopy was utilized by the requesting physician.  No radiographic interpretation.   DG HIP OPERATIVE UNILAT WITH PELVIS RIGHT  Result Date: 08/11/2020 CLINICAL DATA:  Right total hip replacement. EXAM: OPERATIVE right HIP (WITH PELVIS IF PERFORMED) 6 VIEWS TECHNIQUE: Fluoroscopic spot image(s) were submitted for interpretation post-operatively. Radiation exposure index: 0.7839 mGy. COMPARISON:  None. FINDINGS: Six intraoperative fluoroscopic images were obtained of the right hip. The right femoral and acetabular components are well situated. IMPRESSION:  Status post right total hip arthroplasty. Electronically Signed   By: Marijo Conception M.D.   On: 08/11/2020 12:17        Scheduled Meds: . aspirin EC  325 mg Oral Q breakfast  . Chlorhexidine Gluconate Cloth  6 each Topical Daily  . docusate sodium  100 mg Oral BID  . FLUoxetine  80 mg Oral Daily  . gabapentin  200 mg Oral TID  . mupirocin ointment  1 application Nasal BID  . pantoprazole  40 mg Oral Daily  . traZODone  200 mg Oral QHS   Continuous Infusions: . sodium chloride 100 mL/hr at 08/12/20 0600  . methocarbamol (ROBAXIN) IV 500 mg (08/11/20 2030)     LOS: 3 days    Time spent: 25 mins.More than 50% of that time was spent in counseling and/or coordination of care.      Shelly Coss, MD [facility] P1/16/2022, 8:20 AM

## 2020-08-12 NOTE — Progress Notes (Signed)
Subjective: 1 Day Post-Op Procedure(s) (LRB): TOTAL HIP ARTHROPLASTY ANTERIOR APPROACH (Right) Patient reports pain as 4 on 0-10 scale.   Denies CP or SOB.  Voiding without difficulty. Positive flatus. Objective: Vital signs in last 24 hours: Temp:  [97.6 F (36.4 C)-101.2 F (38.4 C)] 101.2 F (38.4 C) (01/16 0534) Pulse Rate:  [67-92] 83 (01/16 0534) Resp:  [8-19] 16 (01/16 0534) BP: (96-143)/(44-74) 143/51 (01/16 0534) SpO2:  [93 %-100 %] 96 % (01/16 0534) Arterial Line BP: (102-163)/(53-159) 142/103 (01/15 1430)  Intake/Output from previous day: 01/15 0701 - 01/16 0700 In: 4237.2 [P.O.:350; I.V.:2972.2; Blood:315; IV Piggyback:600] Out: 2250 [Urine:1250; Blood:1000] Intake/Output this shift: No intake/output data recorded.  Recent Labs    08/11/20 0331 08/11/20 1205 08/11/20 1241 08/11/20 1400 08/12/20 0058  HGB 11.7* 9.2* 8.2* 10.1* 11.0*   Recent Labs    08/11/20 1400 08/12/20 0058  WBC 8.1 7.8  RBC 3.68* 4.01  HCT 32.7* 34.3*  PLT 134* 131*   Recent Labs    08/11/20 0331 08/11/20 1205 08/11/20 1241 08/12/20 0058  NA 142   < > 144 138  K 3.8   < > 3.8 4.1  CL 111  --   --  106  CO2 22  --   --  21*  BUN 6*  --   --  8  CREATININE 0.61  --   --  0.59  GLUCOSE 227*  --   --  105*  CALCIUM 8.6*  --   --  8.1*   < > = values in this interval not displayed.   Recent Labs    08/09/20 1600  INR 1.0    Neurologically intact Sensation intact distally Intact pulses distally Dorsiflexion/Plantar flexion intact Incision: dressing C/D/I Compartment soft No DVT  Assessment/Plan: 1 Day Post-Op Procedure(s) (LRB): TOTAL HIP ARTHROPLASTY ANTERIOR APPROACH (Right) Advance diet Up with therapy  Check H/H in am  Dorothy White 08/12/2020, 8:06 AM

## 2020-08-12 NOTE — Plan of Care (Signed)

## 2020-08-12 NOTE — Progress Notes (Signed)
Pt blood pressure consistanlty low this am physician aware and he asked to hold narcotics. Informed patient that narcotics can lower her blood pressure further. Patient states she doesn't care and is still requesting IV pain medication. Patient sleepy and is a 2/10 on facial pain scale. Offered tylenol but patient declined. IV fluids going cont. To monitor.

## 2020-08-12 NOTE — Evaluation (Signed)
Physical Therapy Evaluation Patient Details Name: Dorothy White MRN: [id number] DOB: 12-13-1950 Today's Date: 08/12/2020   History of Present Illness  70 yo female admitted with R femur fx. S/P THA-direct anterior 08/12/19. Hx of TIA, recurrent falls, L hip ORIF, R humerus fx s/p ORIF, SDH, basal cell ca  Clinical Impression  On eval, pt required Mod assist for mobility. She was able to stand and pivot to recliner using a RW. Pt was very drowsy and tended to be unsafe with her actions so deferred ambulation at this time. Moderate pain with activity. Pt reports that she lives alone. Will recommend ST SNF at this time.     Follow Up Recommendations SNF    Equipment Recommendations  None recommended by PT    Recommendations for Other Services       Precautions / Restrictions Precautions Precautions: Fall Restrictions Weight Bearing Restrictions: No RLE Weight Bearing: Weight bearing as tolerated      Mobility  Bed Mobility Overal bed mobility: Needs Assistance Bed Mobility: Supine to Sit     Supine to sit: Mod assist;HOB elevated     General bed mobility comments: Assist for trunk and bil LEs. Utilized bedpad for scooting, positioning. Increased time. Cues for safety, technqiue.    Transfers Overall transfer level: Needs assistance Equipment used: Rolling walker (2 wheeled) Transfers: Sit to/from [facility] Sit to Stand: Min assist Stand pivot transfers: Min assist       General transfer comment: Assist to rise, stabilize, control descent. VCs safety, technique, hand placement. Stand pivot, bed to recliner using RW. Unsteady and unsafe at times.  Ambulation/Gait             General Gait Details: Deferred -drowsy and unsafe  Stairs            Wheelchair Mobility    Modified Rankin (Stroke Patients Only)       Balance Overall balance assessment: Needs assistance;History of Falls         Standing balance support: Bilateral  upper extremity supported Standing balance-Leahy Scale: Poor                               Pertinent Vitals/Pain Pain Assessment: Faces Faces Pain Scale: Hurts even more Pain Location: R hip with activity Pain Descriptors / Indicators: Discomfort;Grimacing;Guarding Pain Intervention(s): Limited activity within patient's tolerance;Monitored during session;Repositioned;Ice applied    Home Living Family/patient expects to be discharged to:: Private residence   Available Help at Discharge: Friend(s) Type of Home: House Home Access: Level entry     Home Layout: Two level Home Equipment: Environmental consultant - 2 wheels;Cane - single point;Shower seat      Prior Function Level of Independence: Independent         Comments: history of frequent falls     Hand Dominance        Extremity/Trunk Assessment   Upper Extremity Assessment Upper Extremity Assessment: Generalized weakness    Lower Extremity Assessment Lower Extremity Assessment: Generalized weakness    Cervical / Trunk Assessment Cervical / Trunk Assessment: Normal  Communication   Communication: No difficulties  Cognition Arousal/Alertness: Awake/alert (but very drowsy) Behavior During Therapy: WFL for tasks assessed/performed Overall Cognitive Status: Difficult to assess  General Comments      Exercises     Assessment/Plan    PT Assessment Patient needs continued PT services  PT Problem List Decreased strength;Decreased mobility;Decreased range of motion;Decreased activity tolerance;Decreased balance;Decreased knowledge of use of DME;Pain       PT Treatment Interventions DME instruction;Gait training;Therapeutic activities;Therapeutic exercise;Patient/family education;Balance training;Functional mobility training    PT Goals (Current goals can be found in the Care Plan section)  Acute Rehab PT Goals Patient Stated Goal: none stated PT Goal  Formulation: With patient Time For Goal Achievement: 08/26/20 Potential to Achieve Goals: Good    Frequency Min 5X/week   Barriers to discharge        Co-evaluation               AM-PAC PT "6 Clicks" Mobility  Outcome Measure Help needed turning from your back to your side while in a flat bed without using bedrails?: A Little Help needed moving from lying on your back to sitting on the side of a flat bed without using bedrails?: A Little Help needed moving to and from a bed to a chair (including a wheelchair)?: A Little Help needed standing up from a chair using your arms (e.g., wheelchair or bedside chair)?: A Little Help needed to walk in hospital room?: A Little Help needed climbing 3-5 steps with a railing? : A Lot 6 Click Score: 17    End of Session Equipment Utilized During Treatment: Gait belt Activity Tolerance: Patient tolerated treatment well Patient left: in chair;with call bell/phone within reach;with chair alarm set   PT Visit Diagnosis: Muscle weakness (generalized) (M62.81);Pain;Difficulty in walking, not elsewhere classified (R26.2);History of falling (Z91.81);Repeated falls (R29.6) Pain - Right/Left: Right Pain - part of body: Hip    Time: 0750-0809 PT Time Calculation (min) (ACUTE ONLY): 19 min   Charges:   PT Evaluation $PT Eval Moderate Complexity: 1 Mod             Doreatha Massed, PT Acute Rehabilitation  Office: [phone] Pager: [phone]

## 2020-08-13 ENCOUNTER — Encounter (HOSPITAL_COMMUNITY): Payer: Self-pay | Admitting: Orthopedic Surgery

## 2020-08-13 LAB — CBC
HCT: 29.8 % — ABNORMAL LOW (ref 36.0–46.0)
Hemoglobin: 9.7 g/dL — ABNORMAL LOW (ref 12.0–15.0)
MCH: 27.7 pg (ref 26.0–34.0)
MCHC: 32.6 g/dL (ref 30.0–36.0)
MCV: 85.1 fL (ref 80.0–100.0)
Platelets: 112 10*3/uL — ABNORMAL LOW (ref 150–400)
RBC: 3.5 MIL/uL — ABNORMAL LOW (ref 3.87–5.11)
RDW: 15.6 % — ABNORMAL HIGH (ref 11.5–15.5)
WBC: 9.7 10*3/uL (ref 4.0–10.5)
nRBC: 0 % (ref 0.0–0.2)

## 2020-08-13 LAB — BASIC METABOLIC PANEL
Anion gap: 9 (ref 5–15)
BUN: 9 mg/dL (ref 8–23)
CO2: 22 mmol/L (ref 22–32)
Calcium: 8 mg/dL — ABNORMAL LOW (ref 8.9–10.3)
Chloride: 111 mmol/L (ref 98–111)
Creatinine, Ser: 0.6 mg/dL (ref 0.44–1.00)
GFR, Estimated: >60 mL/min (ref >60)
Glucose, Bld: 94 mg/dL (ref 70–99)
Potassium: 4 mmol/L (ref 3.5–5.1)
Sodium: 142 mmol/L (ref 135–145)

## 2020-08-13 MED ORDER — HYDROMORPHONE HCL 1 MG/ML IJ SOLN
1.0000 mg | INTRAMUSCULAR | Status: DC | PRN
Start: 2020-08-13 — End: 2020-08-13

## 2020-08-13 MED ORDER — OXYCODONE HCL 5 MG PO TABS
10.0000 mg | ORAL_TABLET | Freq: Four times a day (QID) | ORAL | Status: DC | PRN
  Administered 2020-08-13 – 2020-08-14 (×5): 10 mg via ORAL
  Filled 2020-08-13 (×6): qty 2

## 2020-08-13 MED ORDER — MORPHINE SULFATE (PF) 2 MG/ML IV SOLN
2.0000 mg | INTRAVENOUS | Status: DC | PRN
  Administered 2020-08-13: 2 mg via INTRAVENOUS
  Filled 2020-08-13: qty 1

## 2020-08-13 NOTE — Progress Notes (Signed)
Physical Therapy Treatment Patient Details Name: Dorothy White MRN: [id number] DOB: Feb 03, 1951 Today's Date: 08/13/2020    History of Present Illness 70 yo female admitted with R femur fx. S/P THA-direct anterior 08/12/19. Hx of TIA, recurrent falls, L hip ORIF, R humerus fx s/p ORIF, SDH, basal cell ca    PT Comments    POD # 2 am session Pt called out stated the "bed was wet".  Pt slightly confused.  Required redirection and repeat cueing to stay on task.  With NT, we assisted pt OOB to [facility] then to recliner.  Pt required + 2 assist and increased time due to pian/anxiety.    General bed mobility comments: Assist for trunk and bil LEs. Utilized bedpad for scooting, positioning. Increased time. Cues for safety, technqiue. General transfer comment: assisted from elevated bed to [facility] then off BSC to amb a few steps to recliner. General Gait Details: used B platform EVA walker for increased support.  Very difficult to weight shift and advance R LE requiring + 2 assist for safety to take a few pivot steps from [facility] to recliner.  Distance limited by pain and effort.  Positioned in recliner to comfort and applied ICE to L hip.   Case Manager came in to discuss [place].    Follow Up Recommendations  SNF     Equipment Recommendations  None recommended by PT    Recommendations for Other Services       Precautions / Restrictions Precautions Precautions: Fall Restrictions Weight Bearing Restrictions: No RLE Weight Bearing: Weight bearing as tolerated    Mobility  Bed Mobility Overal bed mobility: Needs Assistance Bed Mobility: Supine to Sit     Supine to sit: Mod assist;HOB elevated     General bed mobility comments: Assist for trunk and bil LEs. Utilized bedpad for scooting, positioning. Increased time. Cues for safety, technqiue.  Transfers Overall transfer level: Needs assistance Equipment used: Rolling walker (2 wheeled) Transfers: Sit to/from [employer] Sit to Stand: Min assist Stand pivot transfers: Min assist;Mod assist       General transfer comment: assisted from elevated bed to [facility] then off BSC to amb a few steps to recliner.  Ambulation/Gait Ambulation/Gait assistance: Mod assist;+2 physical assistance;+2 safety/equipment Gait Distance (Feet): 3 Feet Assistive device: Bilateral platform walker (EVA walker) Gait Pattern/deviations: Step-to pattern;Decreased stance time - right Gait velocity: decreased   General Gait Details: used B platform EVA walker for increased support.  Very difficult to weight shift and advance R LE requiring + 2 assist for safety to take a few pivot steps from [facility] to recliner.  Distance limited by pain and effort.   Stairs             Wheelchair Mobility    Modified Rankin (Stroke Patients Only)       Balance                                            Cognition Arousal/Alertness: Awake/alert Behavior During Therapy: WFL for tasks assessed/performed Overall Cognitive Status: Within Functional Limits for tasks assessed                                 General Comments: AxO x 2 required repeat functional cueing easily distracted and anxious  Exercises      General Comments        Pertinent Vitals/Pain Pain Assessment: 0-10 Pain Score: 10-Worst pain ever Pain Location: R hip with activity Pain Descriptors / Indicators: Discomfort;Grimacing;Guarding Pain Intervention(s): Monitored during session;Premedicated before session;Repositioned;Ice applied    Home Living                      Prior Function            PT Goals (current goals can now be found in the care plan section) Progress towards PT goals: Progressing toward goals    Frequency    Min 5X/week      PT Plan Current plan remains appropriate    Co-evaluation              AM-PAC PT "6 Clicks" Mobility   Outcome Measure  Help needed turning from  your back to your side while in a flat bed without using bedrails?: A Little Help needed moving from lying on your back to sitting on the side of a flat bed without using bedrails?: A Little Help needed moving to and from a bed to a chair (including a wheelchair)?: A Little Help needed standing up from a chair using your arms (e.g., wheelchair or bedside chair)?: A Lot Help needed to walk in hospital room?: A Lot Help needed climbing 3-5 steps with a railing? : Total 6 Click Score: 14    End of Session Equipment Utilized During Treatment: Gait belt Activity Tolerance: Patient tolerated treatment well Patient left: in chair;with call bell/phone within reach;with chair alarm set Nurse Communication: Mobility status PT Visit Diagnosis: Muscle weakness (generalized) (M62.81);Pain;Difficulty in walking, not elsewhere classified (R26.2);History of falling (Z91.81);Repeated falls (R29.6) Pain - Right/Left: Right Pain - part of body: Hip     Time: 0998-3382 PT Time Calculation (min) (ACUTE ONLY): 24 min  Charges:  $Gait Training: 8-22 mins $Therapeutic Activity: 8-22 mins                     {Antonious Omahoney  PTA Acute  Rehabilitation Services Pager      [phone] Office      [phone]

## 2020-08-13 NOTE — Evaluation (Signed)
Occupational Therapy Evaluation Patient Details Name: Dorothy White MRN: [id number] DOB: Sep 14, 1950 Today's Date: 08/13/2020    History of Present Illness 70 yo female admitted with R femur fx. S/P THA-direct anterior 08/12/19. Hx of TIA, recurrent falls, L hip ORIF, R humerus fx s/p ORIF, SDH, basal cell ca   Clinical Impression   PTA, pt was living with her roommate and was independent. Pt currently requiring Min-Mod A for LB ADLs and Min Guard A for functional mobility and RW. Pt presenting with decreased balance, strength, ROM, and activity tolerance due to pain. Pt would benefit from further acute OT to facilitate safe dc. Recommend dc to SNF for further OT to optimize safety, independence with ADLs, and return to PLOF.     Follow Up Recommendations  SNF    Equipment Recommendations  Other (comment) (Defer to next venue)    Recommendations for Other Services PT consult     Precautions / Restrictions Precautions Precautions: Fall Restrictions Weight Bearing Restrictions: Yes RLE Weight Bearing: Weight bearing as tolerated      Mobility Bed Mobility Overal bed mobility: Needs Assistance Bed Mobility: Supine to Sit     Supine to sit: Min guard;HOB elevated     General bed mobility comments: Pt bringing BLEs towards EOB and then scooting hips towards EOB. Elevating trunk with use of bed rail    Transfers Overall transfer level: Needs assistance Equipment used: Rolling walker (2 wheeled) Transfers: Sit to/from Stand Sit to Stand: Min guard Stand pivot transfers: Min assist;Mod assist       General transfer comment: MIn Guard A for safety. Slow and increased effort    Balance Overall balance assessment: Needs assistance Sitting-balance support: No upper extremity supported;Feet supported Sitting balance-Leahy Scale: Fair     Standing balance support: Bilateral upper extremity supported;During functional activity Standing balance-Leahy Scale:  Poor Standing balance comment: reliant on UE support                           ADL either performed or assessed with clinical judgement   ADL Overall ADL's : Needs assistance/impaired Eating/Feeding: Set up;Sitting   Grooming: Wash/dry face;Set up;Supervision/safety;Sitting   Upper Body Bathing: Set up;Supervision/ safety;Sitting   Lower Body Bathing: Minimal assistance;Sit to/from stand   Upper Body Dressing : Set up;Supervision/safety;Sitting   Lower Body Dressing: Moderate assistance;Sit to/from stand   Toilet Transfer: Min guard;Ambulation;RW (simulated to recliner)           Functional mobility during ADLs: Min guard;Rolling walker General ADL Comments: Pt presenting with decreased balance, strength, and ROM due to pain. Limiting safety and functional performance.     Vision         Perception     Praxis      Pertinent Vitals/Pain Pain Assessment: Faces Pain Score: 10-Worst pain ever Faces Pain Scale: Hurts little more Pain Location: R hip with activity Pain Descriptors / Indicators: Discomfort;Grimacing;Guarding Pain Intervention(s): Monitored during session;Limited activity within patient's tolerance;Repositioned     Hand Dominance Right   Extremity/Trunk Assessment Upper Extremity Assessment Upper Extremity Assessment: Generalized weakness   Lower Extremity Assessment Lower Extremity Assessment: Generalized weakness;RLE deficits/detail RLE Deficits / Details: S/p anterior THA   Cervical / Trunk Assessment Cervical / Trunk Assessment: Normal   Communication Communication Communication: No difficulties   Cognition Arousal/Alertness: Awake/alert Behavior During Therapy: WFL for tasks assessed/performed Overall Cognitive Status: No family/caregiver present to determine baseline cognitive functioning Area of Impairment: Problem solving  Problem Solving: Slow processing;Requires verbal  cues General Comments: Requiring increased time for processing   General Comments       Exercises     Shoulder Instructions      Home Living Family/patient expects to be discharged to:: Private residence Living Arrangements: Non-relatives/Friends Available Help at Discharge: Friend(s) Type of Home: House Home Access: Level entry     Home Layout: Two level   Alternate Level Stairs-Rails: Right Bathroom Shower/Tub: Hospital doctor Toilet: Handicapped height     Home Equipment: Environmental consultant - 2 wheels;Cane - single point;Shower seat;Grab bars - tub/shower          Prior Functioning/Environment Level of Independence: Independent        Comments: ADLs and IADLs; has groceries delivered. Drives. Use to love playing tennis        OT Problem List: Decreased strength;Decreased range of motion;Decreased activity tolerance;Impaired balance (sitting and/or standing);Decreased knowledge of use of DME or AE;Decreased knowledge of precautions;Decreased cognition      OT Treatment/Interventions: Self-care/ADL training;Therapeutic exercise;Energy conservation;DME and/or AE instruction;Therapeutic activities;Patient/family education    OT Goals(Current goals can be found in the care plan section) Acute Rehab OT Goals Patient Stated Goal: Get back home and normal OT Goal Formulation: With patient Time For Goal Achievement: 08/27/20 Potential to Achieve Goals: Good  OT Frequency: Min 2X/week   Barriers to D/C:            Co-evaluation              AM-PAC OT "6 Clicks" Daily Activity     Outcome Measure Help from another person eating meals?: None Help from another person taking care of personal grooming?: A Little Help from another person toileting, which includes using toliet, bedpan, or urinal?: A Little Help from another person bathing (including washing, rinsing, drying)?: A Little Help from another person to put on and taking off regular upper body  clothing?: A Little Help from another person to put on and taking off regular lower body clothing?: A Lot 6 Click Score: 18   End of Session Equipment Utilized During Treatment: Rolling walker Nurse Communication: Mobility status;Other (comment) (IV at RUE)  Activity Tolerance: Patient tolerated treatment well Patient left: in chair;with call bell/phone within reach;with chair alarm set  OT Visit Diagnosis: Unsteadiness on feet (R26.81);Other abnormalities of gait and mobility (R26.89);Muscle weakness (generalized) (M62.81);Pain Pain - Right/Left: Right Pain - part of body: Hip                Time: 2637-8588 OT Time Calculation (min): 23 min Charges:  OT General Charges $OT Visit: 1 Visit OT Evaluation $OT Eval Moderate Complexity: 1 Mod OT Treatments $Self Care/Home Management : 8-22 mins  Danessa Mensch MSOT, OTR/L Acute Rehab Pager: [phone] Office: [place] 08/13/2020, 2:59 PM

## 2020-08-13 NOTE — NC FL2 (Signed)
[place] LEVEL OF CARE SCREENING TOOL     IDENTIFICATION  Patient Name: Dorothy White Birthdate: 03/10/1951 Sex: female Admission Date (Current Location): 08/09/2020  [facility] and [place] Number:  Herbalist and Address:  [facility],  [place] [address], [place]      Provider Number: [id number]  Attending Physician Name and Address:  Shelly Coss, MD  Relative Name and Phone Number:  Sherron Monday   [phone]    Current Level of Care: Hospital Recommended Level of Care: [place] Prior Approval Number:    Date Approved/Denied:   PASRR Number: [id number]  Discharge Plan: SNF    Current Diagnoses: Patient Active Problem List   Diagnosis Date Noted  . Femur fracture, right ([place]) 08/09/2020  . Closed comminuted fracture of proximal ulna, left, initial encounter 04/04/2020  . Protein-calorie malnutrition, severe 07/04/2018  . UTI (urinary tract infection) 07/03/2018  . Nasal bone fractures 07/03/2018  . Knee pain 07/03/2018  . Normal anion gap metabolic acidosis 54/27/0623  . Subdural hematoma ([place]) 07/02/2018  . Recurrent falls 04/16/2017  . 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 At risk for adverse drug event 04/07/2017  . Acute blood loss as cause of postoperative anemia 04/04/2017  . Acute delirium 04/04/2017  . Osteoporosis 04/02/2017  . Closed fracture of neck of left femur ([place]) 03/31/2017  . Scalp contusion 03/31/2017  . Fall at home, initial encounter 03/31/2017  . Hip fracture ([place]) 03/31/2017  . 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 Hx of transient ischemic attack (TIA) 02/19/2017  . Left carotid stenosis- 40-59% 02/19/2017  . Protein calorie malnutrition ([place]) 02/18/2017  . Falls 02/17/2017  . Chest pain 09/26/2016  . Diverticulitis of sigmoid colon 04/25/2016  . Infection due to ESBL-producing Escherichia coli/Diverticular Abscess 03/15/2016  . Diverticulitis  03/12/2016  . Chronic pain syndrome 03/12/2016  . Lesion of pancreas 03/12/2016  . History of chest pain 03/12/2016  . Hypokalemia 03/12/2016  . Angina, class IV ([place]) - chest tightness and pressure with dyspnea with minimal exertion 02/17/2016  . TIA (transient ischemic attack) 12/26/2015  . DOE (dyspnea on exertion) 12/26/2015  . Right sided weakness 12/17/2015  . Ataxia 12/17/2015  . Shoulder fracture 02/15/2015  . Chronic headache 10/11/2013  . Weakness generalized 08/12/2013  . Dizziness and giddiness 08/12/2013  . Abnormality of gait 07/11/2013  . Hyperlipidemia 05/07/2007  . Migraine 05/07/2007  . PREMATURE VENTRICULAR CONTRACTIONS, FREQUENT 05/07/2007  . GERD (gastroesophageal reflux disease) 05/07/2007  . DIVERTICULOSIS, COLON 05/07/2007  . 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 OSTEOPENIA 05/07/2007  . Depression 03/24/2007  . HEMORRHOIDS 03/24/2007  . ALLERGIC RHINITIS 03/24/2007  . LOW BACK PAIN 03/24/2007  . MIGRAINES, HX OF 03/24/2007    Orientation RESPIRATION BLADDER Height & Weight     Self,Time,Situation,Place  Normal Continent Weight: 115 lb 4.8 oz (52.3 kg) Height:  5\' 5"  (165.1 cm)  BEHAVIORAL SYMPTOMS/MOOD NEUROLOGICAL BOWEL NUTRITION STATUS      Continent Diet (Regular Diet)  AMBULATORY STATUS COMMUNICATION OF NEEDS Skin   Extensive Assist Verbally Normal,Surgical wounds                       Personal Care Assistance Level of Assistance  Bathing,Feeding,Dressing Bathing Assistance: Maximum assistance Feeding assistance: Independent Dressing Assistance: Maximum assistance     Functional Limitations Info  Sight,Hearing,Speech Sight Info: Adequate Hearing Info: Adequate Speech Info: Adequate    SPECIAL CARE FACTORS FREQUENCY  PT (By  licensed PT),OT (By licensed OT)     PT Frequency: 5x/week OT Frequency: 5x/week            Contractures      Additional Factors Info  Psychotropic,Allergies,Code Status Code Status Info:  Fullcode Allergies Info: Allergies: Opana (Oxymorphone Hcl), Penicillins Psychotropic Info: Prozac, Desyrel         Current Medications (08/13/2020):  This is the current hospital active medication list Current Facility-Administered Medications  Medication Dose Route Frequency Provider Last Rate Last Admin  . 0.9 %  sodium chloride infusion   Intravenous Continuous Shelly Coss, MD 50 mL/hr at 08/13/20 1018 Rate Change at 08/13/20 1018  . acetaminophen (TYLENOL) tablet 650 mg  650 mg Oral Q6H PRN Rod Can, MD   650 mg at 08/13/20 0931   Or  . acetaminophen (TYLENOL) suppository 650 mg  650 mg Rectal Q6H PRN Rod Can, MD      . aspirin EC tablet 325 mg  325 mg Oral Q breakfast Rod Can, MD   325 mg at 08/13/20 0931  . bisacodyl (DULCOLAX) EC tablet 5 mg  5 mg Oral Daily PRN Rod Can, MD      . Chlorhexidine Gluconate Cloth 2 % PADS 6 each  6 each Topical Daily Rod Can, MD   6 each at 08/12/20 0932  . clonazePAM (KLONOPIN) tablet 0.5 mg  0.5 mg Oral TID PRN Rod Can, MD   0.5 mg at 08/11/20 2039  . docusate sodium (COLACE) capsule 100 mg  100 mg Oral BID Rod Can, MD   100 mg at 08/13/20 0932  . FLUoxetine (PROZAC) capsule 80 mg  80 mg Oral Daily Rod Can, MD   80 mg at 08/13/20 0932  . gabapentin (NEURONTIN) capsule 200 mg  200 mg Oral TID Rod Can, MD   200 mg at 08/13/20 0932  . menthol-cetylpyridinium (CEPACOL) lozenge 3 mg  1 lozenge Oral PRN Swinteck, Aaron Edelman, MD       Or  . phenol (CHLORASEPTIC) mouth spray 1 spray  1 spray Mouth/Throat PRN Swinteck, Aaron Edelman, MD      . methocarbamol (ROBAXIN) 500 mg in dextrose 5 % 50 mL IVPB  500 mg Intravenous Q6H PRN Shelly Coss, MD 100 mL/hr at 08/13/20 1018 500 mg at 08/13/20 1018  . metoCLOPramide (REGLAN) tablet 5-10 mg  5-10 mg Oral Q8H PRN Swinteck, Aaron Edelman, MD       Or  . metoCLOPramide (REGLAN) injection 5-10 mg  5-10 mg Intravenous Q8H PRN Swinteck, Aaron Edelman, MD      . morphine  2 MG/ML injection 2 mg  2 mg Intravenous Q4H PRN Shelly Coss, MD   2 mg at 08/13/20 1018  . mupirocin ointment (BACTROBAN) 2 % 1 application  1 application Nasal BID Rod Can, MD   1 application at 40/98/11 0933  . ondansetron (ZOFRAN) tablet 4 mg  4 mg Oral Q6H PRN Swinteck, Aaron Edelman, MD       Or  . ondansetron (ZOFRAN) injection 4 mg  4 mg Intravenous Q6H PRN Swinteck, Aaron Edelman, MD      . oxyCODONE (Oxy IR/ROXICODONE) immediate release tablet 10 mg  10 mg Oral Q6H PRN Shelly Coss, MD      . pantoprazole (PROTONIX) EC tablet 40 mg  40 mg Oral Daily Rod Can, MD   40 mg at 08/13/20 0932  . traZODone (DESYREL) tablet 200 mg  200 mg Oral QHS Rod Can, MD   200 mg at 08/12/20 2002     Discharge Medications:  Please see discharge summary for a list of discharge medications.  Relevant Imaging Results:  Relevant Lab Results:   Additional Information SS#: [id number]  Lia Hopping, LCSW

## 2020-08-13 NOTE — Progress Notes (Signed)
PROGRESS NOTE    Dorothy White  HAL:[id number] DOB: 1951-06-04 DOA: 08/09/2020 PCP: Maurice Small, MD   Chief Complain: Fall followed by pain of the right hip  Brief Narrative: Patient is a 70 year old female with history of osteoarthritis, recurrent falls, left femur fracture in September 2021, GERD, basal cell carcinoma, anxiety disorder, hyperlipidemia who presented to the emergency department after a mechanical fall at home.  After fall ,she developed right hip pain and shortening of right lower extremity.  In the emergency department she was found to have displaced right femoral neck fracture.  Orthopedics consulted.  Underwent right total hip arthroplasty on 08/11/2020.  PT/OT consulted and recommended SNF on DC.  She is medically stable for discharge to skilled nursing facility as soon as bed is available  Assessment & Plan:   Principal Problem:   Femur fracture, right (HCC) Active Problems:   Depression   GERD (gastroesophageal reflux disease)   DEGENERATION, CERVICAL DISC   Chronic pain syndrome   Recurrent falls   Acute right femoral neck fracture:   Underwent right total hip arthroplasty on 08/11/2020.  PT/OT consulted continue pain management, supportive care.  Started on aspirin for DVT prophylaxis  Post surgery hypotension: Most likely associated with anesthesia effect.  Given a bolus of 500 ml of normal saline and given NS Infusion.  Currently blood pressure   stable  Fever: Resolved.  Hypokalemia: Supplemented and corrected  Chronic pain syndrome: Continue pain management.  Supportive care  GERD: Continue PPI  History of recurrent falls: She had a fracture of left femur in September 2021.  PT/OT recommending SNF   Depression/anxiety: Continue home regimen         DVT prophylaxis: Aspirin Code Status: Full code Family Communication: None at bedside Status is: Inpatient  Remains inpatient appropriate because:Inpatient level of care appropriate due to  severity of illness   Dispo: The patient is from: Home              Anticipated d/c is to: SNF              Anticipated d/c date is:as soon as bed is available              Patient currently is medically stable for dc    Consultants: Orthopedics  Procedures:None  Antimicrobials:  Anti-infectives (From admission, onward)   Start     Dose/Rate Route Frequency Ordered Stop   08/11/20 1700  ceFAZolin (ANCEF) IVPB 2g/100 mL premix        2 g 200 mL/hr over 30 Minutes Intravenous Every 6 hours 08/11/20 1610 08/11/20 2346   08/11/20 0955  ceFAZolin (ANCEF) 2-4 GM/100ML-% IVPB       Note to Pharmacy: Octavio Graves   : cabinet override      08/11/20 0955 08/11/20 1120   08/11/20 0900  ceFAZolin (ANCEF) IVPB 2g/100 mL premix        2 g 200 mL/hr over 30 Minutes Intravenous On call to O.R. 08/10/20 2228 08/11/20 1121      Subjective: Patient seen and examined at the bedside this morning.  Hemodynamically stable during my evaluation.  Complains of severe pain on the operated area.  She was about to work with physical therapy.  Objective: Vitals:   08/12/20 1100 08/12/20 1411 08/12/20 2053 08/13/20 0518  BP: (!) 97/47 (!) 104/52 (!) 99/52 119/61  Pulse:  73 76 75  Resp:  15 16 16   Temp:  99.2 F (37.3 C) 98.9 F (37.2 C)  100.3 F (37.9 C)  TempSrc:  Oral Oral   SpO2:  94% 92% 92%  Weight:      Height:        Intake/Output Summary (Last 24 hours) at 08/13/2020 1331 Last data filed at 08/13/2020 0754 Gross per 24 hour  Intake 2486.13 ml  Output 1400 ml  Net 1086.13 ml   Filed Weights   08/09/20 2140  Weight: 52.3 kg    Examination:  General exam: weak, in moderate distress due to pain HEENT:PERRL,Oral mucosa moist, Ear/Nose normal on gross exam Respiratory system: Bilateral equal air entry, normal vesicular breath sounds, no wheezes or crackles  Cardiovascular system: S1 & S2 heard, RRR. No JVD, murmurs, rubs, gallops or clicks. Gastrointestinal system: Abdomen is  nondistended, soft and nontender. No organomegaly or masses felt. Normal bowel sounds heard. Central nervous system: Alert and oriented. No focal neurological deficits. Extremities: No edema, no clubbing ,no cyanosis, clean surgical wound on the right hip  skin: No rashes, lesions or ulcers,no icterus ,no pallor     Data Reviewed: I have personally reviewed following labs and imaging studies  CBC: Recent Labs  Lab 08/09/20 1600 08/10/20 0341 08/11/20 0331 08/11/20 1205 08/11/20 1241 08/11/20 1400 08/12/20 0058 08/13/20 0203  WBC 9.1 8.5 5.1  --   --  8.1 7.8 9.7  NEUTROABS 7.5  --  3.1  --   --   --   --   --   HGB 12.1 12.0 11.7* 9.2* 8.2* 10.1* 11.0* 9.7*  HCT 39.6 40.4 39.6 27.0* 24.0* 32.7* 34.3* 29.8*  MCV 85.3 87.3 88.0  --   --  88.9 85.5 85.1  PLT 235 157 173  --   --  134* 131* [id number]*   Basic Metabolic Panel: Recent Labs  Lab 08/09/20 1600 08/10/20 0341 08/11/20 0331 08/11/20 1205 08/11/20 1241 08/12/20 0058 08/13/20 0203  NA 139 141 142 143 144 138 142  K 4.0 3.3* 3.8 3.8 3.8 4.1 4.0  CL 111 109 111  --   --  106 111  CO2 22 20* 22  --   --  21* 22  GLUCOSE 85 100* 227*  --   --  105* 94  BUN 12 9 6*  --   --  8 9  CREATININE 0.68 0.61 0.61  --   --  0.59 0.60  CALCIUM 7.9* 8.5* 8.6*  --   --  8.1* 8.0*   GFR: Estimated Creatinine Clearance: 54.8 mL/min (by C-G formula based on SCr of 0.6 mg/dL). Liver Function Tests: Recent Labs  Lab 08/10/20 0341  AST 11*  ALT 10  ALKPHOS 114  BILITOT 0.9  PROT 5.7*  ALBUMIN 3.2*   No results for input(s): LIPASE, AMYLASE in the last 168 hours. No results for input(s): AMMONIA in the last 168 hours. Coagulation Profile: Recent Labs  Lab 08/09/20 1600  INR 1.0   Cardiac Enzymes: No results for input(s): CKTOTAL, CKMB, CKMBINDEX, TROPONINI in the last 168 hours. BNP (last 3 results) No results for input(s): PROBNP in the last 8760 hours. HbA1C: No results for input(s): HGBA1C in the last 72  hours. CBG: No results for input(s): GLUCAP in the last 168 hours. Lipid Profile: No results for input(s): CHOL, HDL, LDLCALC, TRIG, CHOLHDL, LDLDIRECT in the last 72 hours. Thyroid Function Tests: No results for input(s): TSH, T4TOTAL, FREET4, T3FREE, THYROIDAB in the last 72 hours. Anemia Panel: No results for input(s): VITAMINB12, FOLATE, FERRITIN, TIBC, IRON, RETICCTPCT in the last 72 hours. Sepsis  Labs: No results for input(s): PROCALCITON, LATICACIDVEN in the last 168 hours.  Recent Results (from the past 240 hour(s))  Resp Panel by RT-PCR (Flu A&B, Covid) Nasopharyngeal Swab     Status: None   Collection Time: 08/09/20  5:12 PM   Specimen: Nasopharyngeal Swab; Nasopharyngeal(NP) swabs in vial transport medium  Result Value Ref Range Status   SARS Coronavirus 2 by RT PCR NEGATIVE NEGATIVE Final    Comment: (NOTE) SARS-CoV-2 target nucleic acids are NOT DETECTED.  The SARS-CoV-2 RNA is generally detectable in upper respiratory specimens during the acute phase of infection. The lowest concentration of SARS-CoV-2 viral copies this assay can detect is 138 copies/mL. A negative result does not preclude SARS-Cov-2 infection and should not be used as the sole basis for treatment or other patient management decisions. A negative result may occur with  improper specimen collection/handling, submission of specimen other than nasopharyngeal swab, presence of viral mutation(s) within the areas targeted by this assay, and inadequate number of viral copies(<138 copies/mL). A negative result must be combined with clinical observations, patient history, and epidemiological information. The expected result is Negative.  Fact Sheet for Patients:  EntrepreneurPulse.com.au  Fact Sheet for Healthcare Providers:  IncredibleEmployment.be  This test is no t yet approved or cleared by the [place] FDA and  has been authorized for detection and/or  diagnosis of SARS-CoV-2 by FDA under an Emergency Use Authorization (EUA). This EUA will remain  in effect (meaning this test can be used) for the duration of the COVID-19 declaration under Section 564(b)(1) of the Act, 21 U.S.C.section 360bbb-3(b)(1), unless the authorization is terminated  or revoked sooner.       Influenza A by PCR NEGATIVE NEGATIVE Final   Influenza B by PCR NEGATIVE NEGATIVE Final    Comment: (NOTE) The Xpert Xpress SARS-CoV-2/FLU/RSV plus assay is intended as an aid in the diagnosis of influenza from Nasopharyngeal swab specimens and should not be used as a sole basis for treatment. Nasal washings and aspirates are unacceptable for Xpert Xpress SARS-CoV-2/FLU/RSV testing.  Fact Sheet for Patients: EntrepreneurPulse.com.au  Fact Sheet for Healthcare Providers: IncredibleEmployment.be  This test is not yet approved or cleared by the [place] FDA and has been authorized for detection and/or diagnosis of SARS-CoV-2 by FDA under an Emergency Use Authorization (EUA). This EUA will remain in effect (meaning this test can be used) for the duration of the COVID-19 declaration under Section 564(b)(1) of the Act, 21 U.S.C. section 360bbb-3(b)(1), unless the authorization is terminated or revoked.  Performed at [facility], [place] [address] [place], [place] [address]   Surgical pcr screen     Status: Abnormal   Collection Time: 08/09/20 10:39 PM   Specimen: Nasal Mucosa; Nasal Swab  Result Value Ref Range Status   MRSA, PCR NEGATIVE NEGATIVE Final   Staphylococcus aureus POSITIVE (A) NEGATIVE Final    Comment: (NOTE) The Xpert SA Assay (FDA approved for NASAL specimens in patients 26 years of age and older), is one component of a comprehensive surveillance program. It is not intended to diagnose infection nor to guide or monitor treatment. Performed at [facility], [place] [address] [place], [place] [address]          Radiology Studies: Pelvis Portable  Result Date: 08/11/2020 CLINICAL DATA:  S/P right total hip replacement. Image done in PACU. EXAM: PORTABLE PELVIS 1-2 VIEWS COMPARISON:  Right hip radiographs 08/09/2020 FINDINGS: Status post right hip replacement with appropriate positioning and intact appearance  of the hardware. Patient also has a left hip prosthesis. No new acute finding in the bony pelvis. There is postoperative air in the regional soft tissues and overlying skin staple line. IMPRESSION: Status post right hip replacement without evidence of complication. For Electronically Signed   By: Audie Pinto M.D.   On: 08/11/2020 14:00   DG CHEST PORT 1 VIEW  Result Date: 08/12/2020 CLINICAL DATA:  Dyspnea. EXAM: PORTABLE CHEST 1 VIEW COMPARISON:  Chest radiograph 04/30/2018 FINDINGS: Stable cardiomediastinal contours with normal heart size. There are stable coarse bilateral interstitial opacities likely related to tobacco abuse. No new focal consolidation. No pneumothorax or large pleural effusion. Calcified bilateral breast implants. Orthopedic hardware noted in the bilateral proximal humeri. IMPRESSION: No acute cardiopulmonary abnormality.  Chronic bronchitic changes. Electronically Signed   By: Audie Pinto M.D.   On: 08/12/2020 11:45        Scheduled Meds:  aspirin EC  325 mg Oral Q breakfast   Chlorhexidine Gluconate Cloth  6 each Topical Daily   docusate sodium  100 mg Oral BID   FLUoxetine  80 mg Oral Daily   gabapentin  200 mg Oral TID   mupirocin ointment  1 application Nasal BID   pantoprazole  40 mg Oral Daily   traZODone  200 mg Oral QHS   Continuous Infusions:  sodium chloride 50 mL/hr at 08/13/20 1018   methocarbamol (ROBAXIN) IV 500 mg (08/13/20 1018)     LOS: 4 days    Time spent: 25 mins.More than 50% of that time was spent in counseling and/or coordination of care.      Shelly Coss, MD [facility] P1/17/2022, 1:31 PM

## 2020-08-13 NOTE — TOC Initial Note (Signed)
Transition of Care [facility]) - Initial/Assessment Note    Patient Details  Name: Dorothy White MRN: [id number] Date of Birth: 06-08-1951  [facility]) CM/SW Contact:    Lia Hopping, [place] Phone Number: 08/13/2020, 11:00 AM  Clinical Narrative:    Patient admitted after a fall with right hip fracture.              CSW met with the patient at bedside to discuss skilled nursing facility for short rehab. Patient reports she has been to SNF in the past however she could not recall the name of the facility. Patient reports she lives with her friend Algis Liming  [phone] and gave CSW permission to call him. Patient reports prior to her fall she was independent with her ADL's.and still drives. CSW explain the SNF process to the patient her friend Selinda Flavin. CSW will follow up with bed offers.  Patient has been vaccinated.  FL2 completed.  Pending SNF choices   Expected Discharge Plan: [place] Barriers to Discharge: Pending SNF bed,Insurance Authorization,Other (comment) (Covid test)   Patient Goals and CMS Choice Patient states their goals for this hospitalization and ongoing recovery are:: go to short rehab CMS Medicare.gov Compare Post Acute Care list provided to:: Patient [name] Candida Peeling) Choice offered to / list presented to : [facility]  Expected Discharge Plan and Services Expected Discharge Plan: [place] In-house Referral: Clinical Social Work   Post Acute Care Choice: [place] Living arrangements for the past 2 months: [place]                                      Prior Living Arrangements/Services Living arrangements for the past 2 months: Single Family Home Lives with:: Friends Patient language and need for interpreter reviewed:: No Do you feel safe going back to the place where you live?: Yes      Need for Family Participation in Patient Care: Yes (Comment) Care giver support system  in place?: Yes (comment)   Criminal Activity/Legal Involvement Pertinent to Current Situation/Hospitalization: No - Comment as needed  Activities of Daily Living Home Assistive Devices/Equipment: Eyeglasses,Raised toilet seat with rails,Walker (specify type) ADL Screening (condition at time of admission) Patient's cognitive ability adequate to safely complete daily activities?: Yes Is the patient deaf or have difficulty hearing?: No Does the patient have difficulty seeing, even when wearing glasses/contacts?: No Does the patient have difficulty concentrating, remembering, or making decisions?: Yes Patient able to express need for assistance with ADLs?: Yes Does the patient have difficulty dressing or bathing?: No Independently performs ADLs?: Yes (appropriate for developmental age) Does the patient have difficulty walking or climbing stairs?: Yes Weakness of Legs: Right Weakness of Arms/Hands: None  Permission Sought/Granted Permission sought to share information with : Family Supports Permission granted to share information with : Yes, Verbal Permission Granted  Share Information with NAME: Sherron Monday  Permission granted to share info w AGENCY: SNF's in the area  Permission granted to share info w Relationship: Friend  Permission granted to share info w Contact Information: [phone]  Emotional Assessment Appearance:: Appears stated age Attitude/Demeanor/Rapport: Engaged Affect (typically observed): Accepting Orientation: : Oriented to Self,Oriented to Place,Oriented to  Time,Oriented to Situation Alcohol / Substance Use: Not Applicable Psych Involvement: No (comment)  Admission diagnosis:  Femur fracture, right ([place]) [S72.91XA] Patient Active Problem List   Diagnosis Date Noted  .  Femur fracture, right ([place]) 08/09/2020  . Closed comminuted fracture of proximal ulna, left, initial encounter 04/04/2020  . Protein-calorie malnutrition, severe 07/04/2018  . UTI  (urinary tract infection) 07/03/2018  . Nasal bone fractures 07/03/2018  . Knee pain 07/03/2018  . Normal anion gap metabolic acidosis 80/88/1103  . Subdural hematoma ([place]) 07/02/2018  . Recurrent falls 04/16/2017  . 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 At risk for adverse drug event 04/07/2017  . Acute blood loss as cause of postoperative anemia 04/04/2017  . Acute delirium 04/04/2017  . Osteoporosis 04/02/2017  . Closed fracture of neck of left femur ([place]) 03/31/2017  . Scalp contusion 03/31/2017  . Fall at home, initial encounter 03/31/2017  . Hip fracture ([place]) 03/31/2017  . 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 Hx of transient ischemic attack (TIA) 02/19/2017  . Left carotid stenosis- 40-59% 02/19/2017  . Protein calorie malnutrition ([place]) 02/18/2017  . Falls 02/17/2017  . Chest pain 09/26/2016  . Diverticulitis of sigmoid colon 04/25/2016  . Infection due to ESBL-producing Escherichia coli/Diverticular Abscess 03/15/2016  . Diverticulitis 03/12/2016  . Chronic pain syndrome 03/12/2016  . Lesion of pancreas 03/12/2016  . History of chest pain 03/12/2016  . Hypokalemia 03/12/2016  . 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 TIA (transient ischemic attack) 12/26/2015  . DOE (dyspnea on exertion) 12/26/2015  . Right sided weakness 12/17/2015  . Ataxia 12/17/2015  . Shoulder fracture 02/15/2015  . Chronic headache 10/11/2013  . Weakness generalized 08/12/2013  . Dizziness and giddiness 08/12/2013  . Abnormality of gait 07/11/2013  . Hyperlipidemia 05/07/2007  . Migraine 05/07/2007  . PREMATURE VENTRICULAR CONTRACTIONS, FREQUENT 05/07/2007  . GERD (gastroesophageal reflux disease) 05/07/2007  . DIVERTICULOSIS, COLON 05/07/2007  . 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 OSTEOPENIA 05/07/2007  . Depression 03/24/2007  . HEMORRHOIDS 03/24/2007  . ALLERGIC RHINITIS 03/24/2007   . LOW BACK PAIN 03/24/2007  . MIGRAINES, HX OF 03/24/2007   PCP:  Maurice Small, MD Pharmacy:   [id number] [place] [id number]: [phone]: [phone]    Social Determinants of Health (SDOH) Interventions    Readmission Risk Interventions No flowsheet data found.

## 2020-08-13 NOTE — Plan of Care (Signed)
  Problem: Education: Goal: Knowledge of General Education information will improve Description: Including pain rating scale, medication(s)/side effects and non-pharmacologic comfort measures Outcome: Progressing   Problem: Health Behavior/Discharge Planning: Goal: Ability to manage health-related needs will improve Outcome: Progressing   Problem: Clinical Measurements: Goal: Ability to maintain clinical measurements within normal limits will improve Outcome: Progressing Goal: Will remain free from infection Outcome: Progressing Goal: Diagnostic test results will improve Outcome: Progressing Goal: Respiratory complications will improve Outcome: Progressing Goal: Cardiovascular complication will be avoided Outcome: Progressing   Problem: Activity: Goal: Risk for activity intolerance will decrease Outcome: Progressing   Problem: Nutrition: Goal: Adequate nutrition will be maintained Outcome: Progressing   Problem: Coping: Goal: Level of anxiety will decrease Outcome: Progressing   Problem: Elimination: Goal: Will not experience complications related to bowel motility Outcome: Progressing Goal: Will not experience complications related to urinary retention Outcome: Progressing   Problem: Pain Managment: Goal: General experience of comfort will improve Outcome: Progressing   Problem: Safety: Goal: Ability to remain free from injury will improve Outcome: Progressing   Problem: Skin Integrity: Goal: Risk for impaired skin integrity will decrease Outcome: Progressing   Problem: Education: Goal: Verbalization of understanding the information provided (i.e., activity precautions, restrictions, etc) will improve Outcome: Progressing   Problem: Activity: Goal: Ability to ambulate and perform ADLs will improve Outcome: Progressing   Problem: Self-Concept: Goal: Ability to maintain and perform role responsibilities to the fullest extent possible will improve Outcome:  Progressing   Problem: Pain Management: Goal: Pain level will decrease Outcome: Progressing   Problem: Education: Goal: Knowledge of the prescribed therapeutic regimen will improve Outcome: Progressing Goal: Understanding of discharge needs will improve Outcome: Progressing Goal: Individualized Educational Video(s) Outcome: Progressing   Problem: Activity: Goal: Ability to avoid complications of mobility impairment will improve Outcome: Progressing Goal: Ability to tolerate increased activity will improve Outcome: Progressing

## 2020-08-14 LAB — CBC
HCT: 28.4 % — ABNORMAL LOW (ref 36.0–46.0)
Hemoglobin: 8.8 g/dL — ABNORMAL LOW (ref 12.0–15.0)
MCH: 27.8 pg (ref 26.0–34.0)
MCHC: 31 g/dL (ref 30.0–36.0)
MCV: 89.9 fL (ref 80.0–100.0)
Platelets: 161 10*3/uL (ref 150–400)
RBC: 3.16 MIL/uL — ABNORMAL LOW (ref 3.87–5.11)
RDW: 15.8 % — ABNORMAL HIGH (ref 11.5–15.5)
WBC: 6.4 10*3/uL (ref 4.0–10.5)
nRBC: 0 % (ref 0.0–0.2)

## 2020-08-14 LAB — RESP PANEL BY RT-PCR (FLU A&B, COVID) ARPGX2
Influenza A by PCR: NEGATIVE
Influenza B by PCR: NEGATIVE
SARS Coronavirus 2 by RT PCR: NEGATIVE

## 2020-08-14 MED ORDER — OXYCODONE HCL 10 MG PO TABS: 10.0000 mg | ORAL_TABLET | Freq: Four times a day (QID) | ORAL | 0 refills | Status: DC | PRN

## 2020-08-14 MED ORDER — POLYETHYLENE GLYCOL 3350 17 G PO PACK: 17.0000 g | PACK | Freq: Every day | ORAL | 0 refills | Status: DC | PRN

## 2020-08-14 MED ORDER — ASPIRIN 81 MG PO CHEW: 81.0000 mg | CHEWABLE_TABLET | Freq: Two times a day (BID) | ORAL | 0 refills | Status: AC

## 2020-08-14 MED ORDER — DOCUSATE SODIUM 100 MG PO CAPS: 100.0000 mg | ORAL_CAPSULE | Freq: Two times a day (BID) | ORAL | 0 refills | Status: DC

## 2020-08-14 NOTE — Discharge Summary (Signed)
Physician Discharge Summary  Dorothy White [id number] DOB: 01/08/1951 DOA: 08/09/2020  PCP: Maurice Small, MD  Admit date: 08/09/2020 Discharge date: 08/14/2020  Admitted From: Home Disposition:  SNF  Discharge Condition:Stable CODE STATUS:FULL Diet recommendation: Heart Healthy  Brief/Interim Summary: Patient is a 70 year old female with history of osteoarthritis, recurrent falls, left femur fracture in September 2021, GERD, basal cell carcinoma, anxiety disorder, hyperlipidemia who presented to the emergency department after a mechanical fall at home.  After fall ,she developed right hip pain and shortening of right lower extremity.  In the emergency department she was found to have displaced right femoral neck fracture.  Orthopedics consulted.  Underwent right total hip arthroplasty on 08/11/2020.  PT/OT consulted and recommended SNF on DC.  She is medically stable for discharge to skilled nursing facility   Following problems were addressed during her hospitalization:  Acute right femoral neck fracture:   Underwent right total hip arthroplasty on 08/11/2020.  PT/OT consulted and recommended .  Started on aspirin for DVT prophylaxis  Post surgery hypotension: Most likely associated with anesthesia effect.  Given a bolus of 500 ml of normal saline and given NS Infusion.  Currently blood pressure stable.  Fluids stopped  Fever: Resolved.  Hypokalemia: Supplemented and corrected  Chronic pain syndrome: Continue pain management.  Supportive care  GERD: Continue PPI  History of recurrent falls: She had a fracture of left femur in September 2021.  PT/OT recommending SNF   Depression/anxiety: Continue home regimen    Discharge Diagnoses:  Principal Problem:   Femur fracture, right (HCC) Active Problems:   Depression   GERD (gastroesophageal reflux disease)   DEGENERATION, CERVICAL DISC   Chronic pain syndrome   Recurrent falls    Discharge  Instructions  Discharge Instructions    Diet - low sodium heart healthy   Complete by: As directed    Discharge instructions   Complete by: As directed    1)Please take prescribed medications as instructed. 2)Follow up with orthopedics as an outpatient in 2 weeks.  Name and number of the provider has been attached.   Increase activity slowly   Complete by: As directed    No wound care   Complete by: As directed      Allergies as of 08/14/2020      Reactions   Opana [oxymorphone Hcl] Other (See Comments)   hallucinations   Penicillins Hives   Has patient had a PCN reaction causing immediate rash, facial/tongue/throat swelling, SOB or lightheadedness with hypotension: Unknown Has patient had a PCN reaction causing severe rash involving mucus membranes or skin necrosis: No Has patient had a PCN reaction that required hospitalization No Has patient had a PCN reaction occurring within the last 10 years: No If all of the above answers are "NO", then may proceed with Cephalosporin use.      Medication List    TAKE these medications   aspirin 81 MG chewable tablet Commonly known as: Aspirin Childrens Chew 1 tablet (81 mg total) by mouth 2 (two) times daily with a meal.   carbidopa-levodopa 25-100 MG tablet Commonly known as: SINEMET IR Take 1 tablet by mouth 3 (three) times daily. 7am/11am/4pm   clonazePAM 1 MG tablet Commonly known as: KLONOPIN Take 0.5 mg by mouth at bedtime as needed for anxiety.   docusate sodium 100 MG capsule Commonly known as: COLACE Take 1 capsule (100 mg total) by mouth 2 (two) times daily.   feeding supplement Liqd Take 237 mLs by mouth 3 (three) times  daily between meals.   FLUoxetine 40 MG capsule Commonly known as: PROZAC Take 80 mg by mouth daily.   gabapentin 100 MG capsule Commonly known as: NEURONTIN Take 200 mg by mouth 3 (three) times daily.   Magnesium 500 MG Tabs Take 500 mg by mouth daily.   mometasone 50 MCG/ACT nasal  spray Commonly known as: NASONEX Place 2 sprays into the nose daily.   multivitamin with minerals Tabs tablet Take 1 tablet by mouth daily.   omeprazole 20 MG capsule Commonly known as: PRILOSEC Take 20 mg by mouth daily.   ondansetron 8 MG tablet Commonly known as: ZOFRAN Take 1 tablet (8 mg total) by mouth every 8 (eight) hours as needed for nausea or vomiting.   Oxycodone HCl 10 MG Tabs Take 1 tablet (10 mg total) by mouth every 6 (six) hours as needed for moderate pain.   polyethylene glycol 17 g packet Commonly known as: MiraLax Take 17 g by mouth daily as needed.   topiramate 100 MG tablet Commonly known as: TOPAMAX Take 1 tablet (100 mg total) by mouth at bedtime.   trazodone 300 MG tablet Commonly known as: DESYREL Take 1 tablet (300 mg total) by mouth at bedtime as needed for sleep.   traZODone 100 MG tablet Commonly known as: DESYREL Take 200 mg by mouth at bedtime.   vitamin B-12 1000 MCG tablet Commonly known as: CYANOCOBALAMIN Take 1 tablet (1,000 mcg total) by mouth daily.       Follow-up Information    Swinteck, Aaron Edelman, MD. Schedule an appointment as soon as possible for a visit in 2 weeks.   Specialty: Orthopedic Surgery Why: For wound re-check, For suture removal Contact information: [address] [place] [address] [phone]              Allergies  Allergen Reactions  . Opana [Oxymorphone Hcl] Other (See Comments)    hallucinations  . Penicillins Hives    Has patient had a PCN reaction causing immediate rash, facial/tongue/throat swelling, SOB or lightheadedness with hypotension: Unknown Has patient had a PCN reaction causing severe rash involving mucus membranes or skin necrosis: No Has patient had a PCN reaction that required hospitalization No Has patient had a PCN reaction occurring within the last 10 years: No If all of the above answers are "NO", then may proceed with Cephalosporin use.      Consultations:  Orthopedics   Procedures/Studies: Pelvis Portable  Result Date: 08/11/2020 CLINICAL DATA:  S/P right total hip replacement. Image done in PACU. EXAM: PORTABLE PELVIS 1-2 VIEWS COMPARISON:  Right hip radiographs 08/09/2020 FINDINGS: Status post right hip replacement with appropriate positioning and intact appearance of the hardware. Patient also has a left hip prosthesis. No new acute finding in the bony pelvis. There is postoperative air in the regional soft tissues and overlying skin staple line. IMPRESSION: Status post right hip replacement without evidence of complication. For Electronically Signed   By: Audie Pinto M.D.   On: 08/11/2020 14:00   DG CHEST PORT 1 VIEW  Result Date: 08/12/2020 CLINICAL DATA:  Dyspnea. EXAM: PORTABLE CHEST 1 VIEW COMPARISON:  Chest radiograph 04/30/2018 FINDINGS: Stable cardiomediastinal contours with normal heart size. There are stable coarse bilateral interstitial opacities likely related to tobacco abuse. No new focal consolidation. No pneumothorax or large pleural effusion. Calcified bilateral breast implants. Orthopedic hardware noted in the bilateral proximal humeri. IMPRESSION: No acute cardiopulmonary abnormality.  Chronic bronchitic changes. Electronically Signed   By: Jac Canavan.D.  On: 08/12/2020 11:45   DG Knee Right Port  Result Date: 08/10/2020 CLINICAL DATA:  Pain following trauma EXAM: PORTABLE RIGHT KNEE - 1-2 VIEW COMPARISON:  None. FINDINGS: Frontal and lateral views obtained. No fracture or dislocation. No joint effusion. No appreciable joint space narrowing. There is a spur along the anterior superior patella. No erosion. 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 No fracture or dislocation. No joint effusion. No appreciable joint space narrowing. Electronically Signed   By: Lowella Grip III M.D.   On: 08/10/2020 09:11   DG C-Arm 1-60  Min-No Report  Result Date: 08/11/2020 Fluoroscopy was utilized by the requesting physician.  No radiographic interpretation.   DG HIP OPERATIVE UNILAT WITH PELVIS RIGHT  Result Date: 08/11/2020 CLINICAL DATA:  Right total hip replacement. EXAM: OPERATIVE right HIP (WITH PELVIS IF PERFORMED) 6 VIEWS TECHNIQUE: Fluoroscopic spot image(s) were submitted for interpretation post-operatively. Radiation exposure index: 0.7839 mGy. COMPARISON:  None. FINDINGS: Six intraoperative fluoroscopic images were obtained of the right hip. The right femoral and acetabular components are well situated. IMPRESSION: Status post right total hip arthroplasty. Electronically Signed   By: Marijo Conception M.D.   On: 08/11/2020 12:17   DG Hip Unilat With Pelvis 2-3 Views Right  Result Date: 08/09/2020 CLINICAL DATA:  70 year old female with right hip pain after falling EXAM: DG HIP (WITH OR WITHOUT PELVIS) 2-3V RIGHT COMPARISON:  None. FINDINGS: Acute fracture through the right femoral neck consistent with a transcervical fracture. The femoral head remains located. Incompletely imaged surgical changes of left total hip arthroplasty. Bony pelvis is intact. Left greater than right facet arthropathy at L4-L5 and L5-S1. IMPRESSION: Acute right transcervical femoral neck fracture. Electronically Signed   By: Jacqulynn Cadet M.D.   On: 08/09/2020 16:47       Subjective:  Patient seen and examined the bedside this morning.  Hemodynamically stable for discharge today.  Discharge Exam: Vitals:   08/13/20 2056 08/14/20 0516  BP: 129/81 124/60  Pulse: 85 91  Resp: 16 16  Temp: 99.4 F (37.4 C) 98.4 F (36.9 C)  SpO2: 95% (!) 86%   Vitals:   08/13/20 1331 08/13/20 1536 08/13/20 2056 08/14/20 0516  BP: (!) 90/49 (!) 103/50 129/81 124/60  Pulse: 74 73 85 91  Resp: 20  16 16   Temp: 98.1 F (36.7 C)  99.4 F (37.4 C) 98.4 F (36.9 C)  TempSrc:   Oral Oral  SpO2: 95%  95% (!) 86%  Weight:      Height:         General: Pt is alert, awake, not in acute distress Cardiovascular: RRR, S1/S2 +, no rubs, no gallops Respiratory: CTA bilaterally, no wheezing, no rhonchi Abdominal: Soft, NT, ND, bowel sounds + Extremities: no edema, no cyanosis    The results of significant diagnostics from this hospitalization (including imaging, microbiology, ancillary and laboratory) are listed below for reference.     Microbiology: Recent Results (from the past 240 hour(s))  Resp Panel by RT-PCR (Flu A&B, Covid) Nasopharyngeal Swab     Status: None   Collection Time: 08/09/20  5:12 PM   Specimen: Nasopharyngeal Swab; Nasopharyngeal(NP) swabs in vial transport medium  Result Value Ref Range Status   SARS Coronavirus 2 by RT PCR NEGATIVE NEGATIVE Final    Comment: (NOTE) SARS-CoV-2 target nucleic acids are NOT DETECTED.  The SARS-CoV-2 RNA is generally detectable in upper respiratory specimens during the acute phase of infection. The lowest concentration  of SARS-CoV-2 viral copies this assay can detect is 138 copies/mL. A negative result does not preclude SARS-Cov-2 infection and should not be used as the sole basis for treatment or other patient management decisions. A negative result may occur with  improper specimen collection/handling, submission of specimen other than nasopharyngeal swab, presence of viral mutation(s) within the areas targeted by this assay, and inadequate number of viral copies(<138 copies/mL). A negative result must be combined with clinical observations, patient history, and epidemiological information. The expected result is Negative.  Fact Sheet for Patients:  EntrepreneurPulse.com.au  Fact Sheet for Healthcare Providers:  IncredibleEmployment.be  This test is no t yet approved or cleared by the [place] FDA and  has been authorized for detection and/or diagnosis of SARS-CoV-2 by FDA under an Emergency Use Authorization (EUA). This  EUA will remain  in effect (meaning this test can be used) for the duration of the COVID-19 declaration under Section 564(b)(1) of the Act, 21 U.S.C.section 360bbb-3(b)(1), unless the authorization is terminated  or revoked sooner.       Influenza A by PCR NEGATIVE NEGATIVE Final   Influenza B by PCR NEGATIVE NEGATIVE Final    Comment: (NOTE) The Xpert Xpress SARS-CoV-2/FLU/RSV plus assay is intended as an aid in the diagnosis of influenza from Nasopharyngeal swab specimens and should not be used as a sole basis for treatment. Nasal washings and aspirates are unacceptable for Xpert Xpress SARS-CoV-2/FLU/RSV testing.  Fact Sheet for Patients: EntrepreneurPulse.com.au  Fact Sheet for Healthcare Providers: IncredibleEmployment.be  This test is not yet approved or cleared by the [place] FDA and has been authorized for detection and/or diagnosis of SARS-CoV-2 by FDA under an Emergency Use Authorization (EUA). This EUA will remain in effect (meaning this test can be used) for the duration of the COVID-19 declaration under Section 564(b)(1) of the Act, 21 U.S.C. section 360bbb-3(b)(1), unless the authorization is terminated or revoked.  Performed at [facility], [place] [address], [place], [place] [address]   Surgical pcr screen     Status: Abnormal   Collection Time: 08/09/20 10:39 PM   Specimen: Nasal Mucosa; Nasal Swab  Result Value Ref Range Status   MRSA, PCR NEGATIVE NEGATIVE Final   Staphylococcus aureus POSITIVE (A) NEGATIVE Final    Comment: (NOTE) The Xpert SA Assay (FDA approved for NASAL specimens in patients 57 years of age and older), is one component of a comprehensive surveillance program. It is not intended to diagnose infection nor to guide or monitor treatment. Performed at [facility], [place] [address], [place], [place] [address]   Resp Panel by RT-PCR (Flu A&B, Covid)  Nasopharyngeal Swab     Status: None   Collection Time: 08/14/20  9:30 AM   Specimen: Nasopharyngeal Swab; Nasopharyngeal(NP) swabs in vial transport medium  Result Value Ref Range Status   SARS Coronavirus 2 by RT PCR NEGATIVE NEGATIVE Final    Comment: (NOTE) SARS-CoV-2 target nucleic acids are NOT DETECTED.  The SARS-CoV-2 RNA is generally detectable in upper respiratory specimens during the acute phase of infection. The lowest concentration of SARS-CoV-2 viral copies this assay can detect is 138 copies/mL. A negative result does not preclude SARS-Cov-2 infection and should not be used as the sole basis for treatment or other patient management decisions. A negative result may occur with  improper specimen collection/handling, submission of specimen other than nasopharyngeal swab, presence of viral mutation(s) within the areas targeted by this assay, and inadequate number of viral copies(<138 copies/mL). A  negative result must be combined with clinical observations, patient history, and epidemiological information. The expected result is Negative.  Fact Sheet for Patients:  EntrepreneurPulse.com.au  Fact Sheet for Healthcare Providers:  IncredibleEmployment.be  This test is no t yet approved or cleared by the [place] FDA and  has been authorized for detection and/or diagnosis of SARS-CoV-2 by FDA under an Emergency Use Authorization (EUA). This EUA will remain  in effect (meaning this test can be used) for the duration of the COVID-19 declaration under Section 564(b)(1) of the Act, 21 U.S.C.section 360bbb-3(b)(1), unless the authorization is terminated  or revoked sooner.       Influenza A by PCR NEGATIVE NEGATIVE Final   Influenza B by PCR NEGATIVE NEGATIVE Final    Comment: (NOTE) The Xpert Xpress SARS-CoV-2/FLU/RSV plus assay is intended as an aid in the diagnosis of influenza from Nasopharyngeal swab specimens and should not be  used as a sole basis for treatment. Nasal washings and aspirates are unacceptable for Xpert Xpress SARS-CoV-2/FLU/RSV testing.  Fact Sheet for Patients: EntrepreneurPulse.com.au  Fact Sheet for Healthcare Providers: IncredibleEmployment.be  This test is not yet approved or cleared by the [place] FDA and has been authorized for detection and/or diagnosis of SARS-CoV-2 by FDA under an Emergency Use Authorization (EUA). This EUA will remain in effect (meaning this test can be used) for the duration of the COVID-19 declaration under Section 564(b)(1) of the Act, 21 U.S.C. section 360bbb-3(b)(1), unless the authorization is terminated or revoked.  Performed at [facility], [place] [address] [place], [place] [address]      Labs: BNP (last 3 results) No results for input(s): BNP in the last 8760 hours. Basic Metabolic Panel: Recent Labs  Lab 08/09/20 1600 08/10/20 0341 08/11/20 0331 08/11/20 1205 08/11/20 1241 08/12/20 0058 08/13/20 0203  NA 139 141 142 143 144 138 142  K 4.0 3.3* 3.8 3.8 3.8 4.1 4.0  CL 111 109 111  --   --  106 111  CO2 22 20* 22  --   --  21* 22  GLUCOSE 85 100* 227*  --   --  105* 94  BUN 12 9 6*  --   --  8 9  CREATININE 0.68 0.61 0.61  --   --  0.59 0.60  CALCIUM 7.9* 8.5* 8.6*  --   --  8.1* 8.0*   Liver Function Tests: Recent Labs  Lab 08/10/20 0341  AST 11*  ALT 10  ALKPHOS 114  BILITOT 0.9  PROT 5.7*  ALBUMIN 3.2*   No results for input(s): LIPASE, AMYLASE in the last 168 hours. No results for input(s): AMMONIA in the last 168 hours. CBC: Recent Labs  Lab 08/09/20 1600 08/10/20 0341 08/11/20 0331 08/11/20 1205 08/11/20 1241 08/11/20 1400 08/12/20 0058 08/13/20 0203 08/14/20 0612  WBC 9.1   < > 5.1  --   --  8.1 7.8 9.7 6.4  NEUTROABS 7.5  --  3.1  --   --   --   --   --   --   HGB 12.1   < > 11.7*   < > 8.2* 10.1* 11.0* 9.7* 8.8*  HCT 39.6   < > 39.6   < > 24.0*  32.7* 34.3* 29.8* 28.4*  MCV 85.3   < > 88.0  --   --  88.9 85.5 85.1 89.9  PLT 235   < > 173  --   --  134* 131* 112* 161   < > =  values in this interval not displayed.   Cardiac Enzymes: No results for input(s): CKTOTAL, CKMB, CKMBINDEX, TROPONINI in the last 168 hours. BNP: Invalid input(s): POCBNP CBG: No results for input(s): GLUCAP in the last 168 hours. D-Dimer No results for input(s): DDIMER in the last 72 hours. Hgb A1c No results for input(s): HGBA1C in the last 72 hours. Lipid Profile No results for input(s): CHOL, HDL, LDLCALC, TRIG, CHOLHDL, LDLDIRECT in the last 72 hours. Thyroid function studies No results for input(s): TSH, T4TOTAL, T3FREE, THYROIDAB in the last 72 hours.  Invalid input(s): FREET3 Anemia work up No results for input(s): VITAMINB12, FOLATE, FERRITIN, TIBC, IRON, RETICCTPCT in the last 72 hours. Urinalysis    Component Value Date/Time   COLORURINE AMBER (A) 07/02/2018 2010   APPEARANCEUR HAZY (A) 07/02/2018 2010   LABSPEC 1.020 07/02/2018 2010   PHURINE 5.0 07/02/2018 2010   GLUCOSEU NEGATIVE 07/02/2018 2010   GLUCOSEU NEGATIVE 04/13/2007 1417   HGBUR NEGATIVE 07/02/2018 2010   [place] NEGATIVE 07/02/2018 2010   KETONESUR 5 (A) 07/02/2018 2010   PROTEINUR 100 (A) 07/02/2018 2010   UROBILINOGEN 0.2 02/04/2015 1800   NITRITE POSITIVE (A) 07/02/2018 2010   LEUKOCYTESUR MODERATE (A) 07/02/2018 2010   Sepsis Labs Invalid input(s): PROCALCITONIN,  WBC,  LACTICIDVEN Microbiology Recent Results (from the past 240 hour(s))  Resp Panel by RT-PCR (Flu A&B, Covid) Nasopharyngeal Swab     Status: None   Collection Time: 08/09/20  5:12 PM   Specimen: Nasopharyngeal Swab; Nasopharyngeal(NP) swabs in vial transport medium  Result Value Ref Range Status   SARS Coronavirus 2 by RT PCR NEGATIVE NEGATIVE Final    Comment: (NOTE) SARS-CoV-2 target nucleic acids are NOT DETECTED.  The SARS-CoV-2 RNA is generally detectable in upper  respiratory specimens during the acute phase of infection. The lowest concentration of SARS-CoV-2 viral copies this assay can detect is 138 copies/mL. A negative result does not preclude SARS-Cov-2 infection and should not be used as the sole basis for treatment or other patient management decisions. A negative result may occur with  improper specimen collection/handling, submission of specimen other than nasopharyngeal swab, presence of viral mutation(s) within the areas targeted by this assay, and inadequate number of viral copies(<138 copies/mL). A negative result must be combined with clinical observations, patient history, and epidemiological information. The expected result is Negative.  Fact Sheet for Patients:  EntrepreneurPulse.com.au  Fact Sheet for Healthcare Providers:  IncredibleEmployment.be  This test is no t yet approved or cleared by the [place] FDA and  has been authorized for detection and/or diagnosis of SARS-CoV-2 by FDA under an Emergency Use Authorization (EUA). This EUA will remain  in effect (meaning this test can be used) for the duration of the COVID-19 declaration under Section 564(b)(1) of the Act, 21 U.S.C.section 360bbb-3(b)(1), unless the authorization is terminated  or revoked sooner.       Influenza A by PCR NEGATIVE NEGATIVE Final   Influenza B by PCR NEGATIVE NEGATIVE Final    Comment: (NOTE) The Xpert Xpress SARS-CoV-2/FLU/RSV plus assay is intended as an aid in the diagnosis of influenza from Nasopharyngeal swab specimens and should not be used as a sole basis for treatment. Nasal washings and aspirates are unacceptable for Xpert Xpress SARS-CoV-2/FLU/RSV testing.  Fact Sheet for Patients: EntrepreneurPulse.com.au  Fact Sheet for Healthcare Providers: IncredibleEmployment.be  This test is not yet approved or cleared by the [place] FDA and has been  authorized for detection and/or diagnosis of SARS-CoV-2 by FDA under an Emergency Use  Authorization (EUA). This EUA will remain in effect (meaning this test can be used) for the duration of the COVID-19 declaration under Section 564(b)(1) of the Act, 21 U.S.C. section 360bbb-3(b)(1), unless the authorization is terminated or revoked.  Performed at [facility], [place] [address] [place], [place] [address]   Surgical pcr screen     Status: Abnormal   Collection Time: 08/09/20 10:39 PM   Specimen: Nasal Mucosa; Nasal Swab  Result Value Ref Range Status   MRSA, PCR NEGATIVE NEGATIVE Final   Staphylococcus aureus POSITIVE (A) NEGATIVE Final    Comment: (NOTE) The Xpert SA Assay (FDA approved for NASAL specimens in patients 30 years of age and older), is one component of a comprehensive surveillance program. It is not intended to diagnose infection nor to guide or monitor treatment. Performed at [facility], [place] [address] [place], [place] [address]   Resp Panel by RT-PCR (Flu A&B, Covid) Nasopharyngeal Swab     Status: None   Collection Time: 08/14/20  9:30 AM   Specimen: Nasopharyngeal Swab; Nasopharyngeal(NP) swabs in vial transport medium  Result Value Ref Range Status   SARS Coronavirus 2 by RT PCR NEGATIVE NEGATIVE Final    Comment: (NOTE) SARS-CoV-2 target nucleic acids are NOT DETECTED.  The SARS-CoV-2 RNA is generally detectable in upper respiratory specimens during the acute phase of infection. The lowest concentration of SARS-CoV-2 viral copies this assay can detect is 138 copies/mL. A negative result does not preclude SARS-Cov-2 infection and should not be used as the sole basis for treatment or other patient management decisions. A negative result may occur with  improper specimen collection/handling, submission of specimen other than nasopharyngeal swab, presence of viral mutation(s) within the areas targeted by this assay, and  inadequate number of viral copies(<138 copies/mL). A negative result must be combined with clinical observations, patient history, and epidemiological information. The expected result is Negative.  Fact Sheet for Patients:  EntrepreneurPulse.com.au  Fact Sheet for Healthcare Providers:  IncredibleEmployment.be  This test is no t yet approved or cleared by the [place] FDA and  has been authorized for detection and/or diagnosis of SARS-CoV-2 by FDA under an Emergency Use Authorization (EUA). This EUA will remain  in effect (meaning this test can be used) for the duration of the COVID-19 declaration under Section 564(b)(1) of the Act, 21 U.S.C.section 360bbb-3(b)(1), unless the authorization is terminated  or revoked sooner.       Influenza A by PCR NEGATIVE NEGATIVE Final   Influenza B by PCR NEGATIVE NEGATIVE Final    Comment: (NOTE) The Xpert Xpress SARS-CoV-2/FLU/RSV plus assay is intended as an aid in the diagnosis of influenza from Nasopharyngeal swab specimens and should not be used as a sole basis for treatment. Nasal washings and aspirates are unacceptable for Xpert Xpress SARS-CoV-2/FLU/RSV testing.  Fact Sheet for Patients: EntrepreneurPulse.com.au  Fact Sheet for Healthcare Providers: IncredibleEmployment.be  This test is not yet approved or cleared by the [place] FDA and has been authorized for detection and/or diagnosis of SARS-CoV-2 by FDA under an Emergency Use Authorization (EUA). This EUA will remain in effect (meaning this test can be used) for the duration of the COVID-19 declaration under Section 564(b)(1) of the Act, 21 U.S.C. section 360bbb-3(b)(1), unless the authorization is terminated or revoked.  Performed at [facility], [place] [address], [place], [place] [address]     Please note: You were cared for by a hospitalist during your hospital stay.  Once you  are discharged, your primary care physician will handle any further medical issues. Please note that NO REFILLS for any discharge medications will be authorized once you are discharged, as it is imperative that you return to your primary care physician (or establish a relationship with a primary care physician if you do not have one) for your post hospital discharge needs so that they can reassess your need for medications and monitor your lab values.    Time coordinating discharge: 40 minutes  SIGNED:   Shelly Coss, MD  [facility] 08/14/2020, 12:31 PM Pager [phone]  If 7PM-7AM, please contact night-coverage [email or web address] Password [email or web address]

## 2020-08-14 NOTE — Progress Notes (Signed)
Physical Therapy Treatment Patient Details Name: Dorothy White MRN: [id number] DOB: 09/02/1950 Today's Date: 08/14/2020    History of Present Illness 70 yo female admitted with R femur fx. S/P THA-direct anterior 08/12/19. Hx of TIA, recurrent falls, L hip ORIF, R humerus fx s/p ORIF, SDH, basal cell ca    PT Comments    Progressing slowly with mobility. Plan is for possible d/c to SNF today.    Follow Up Recommendations  SNF     Equipment Recommendations  None recommended by PT    Recommendations for Other Services       Precautions / Restrictions Precautions Precautions: Fall Restrictions Weight Bearing Restrictions: No RLE Weight Bearing: Weight bearing as tolerated    Mobility  Bed Mobility Overal bed mobility: Needs Assistance Bed Mobility: Supine to Sit     Supine to sit: Min assist;HOB elevated     General bed mobility comments: Assist for LEs and to scoot to EOB. Increased time. Cues for safety, technique.  Transfers Overall transfer level: Needs assistance Equipment used: Rolling walker (2 wheeled) Transfers: Sit to/from Stand Sit to Stand: Min assist Stand pivot transfers: Min assist       General transfer comment: Assist to rise, steady, control descent. Cues for safety, technique, hand placement.  Ambulation/Gait Ambulation/Gait assistance: Min assist Gait Distance (Feet): 7 Feet Assistive device: Rolling walker (2 wheeled) Gait Pattern/deviations: Step-to pattern;Antalgic     General Gait Details: Assist to steady throughout short distance. Cues for safety, sequence, proper use of RW. Slow gait speed. Pt fatigues easily. Followed closely with recliner and used it to transport pt back to bedside.   Stairs             Wheelchair Mobility    Modified Rankin (Stroke Patients Only)       Balance Overall balance assessment: Needs assistance;History of Falls         Standing balance support: Bilateral upper extremity  supported Standing balance-Leahy Scale: Poor                              Cognition Arousal/Alertness: Awake/alert Behavior During Therapy: WFL for tasks assessed/performed Overall Cognitive Status: No family/caregiver present to determine baseline cognitive functioning Area of Impairment: Problem solving                             Problem Solving: Slow processing;Requires verbal cues General Comments: Requiring increased time for processing      Exercises      General Comments        Pertinent Vitals/Pain Pain Assessment: Faces Faces Pain Scale: Hurts little more Pain Location: R hip with activity Pain Descriptors / Indicators: Discomfort;Grimacing;Guarding Pain Intervention(s): Limited activity within patient's tolerance;Monitored during session;Repositioned    Home Living                      Prior Function            PT Goals (current goals can now be found in the care plan section) Progress towards PT goals: Progressing toward goals    Frequency    Min 5X/week      PT Plan Current plan remains appropriate    Co-evaluation              AM-PAC PT "6 Clicks" Mobility   Outcome Measure  Help needed turning from your back to your  side while in a flat bed without using bedrails?: A Little Help needed moving from lying on your back to sitting on the side of a flat bed without using bedrails?: A Little Help needed moving to and from a bed to a chair (including a wheelchair)?: A Little Help needed standing up from a chair using your arms (e.g., wheelchair or bedside chair)?: A Little Help needed to walk in hospital room?: A Little Help needed climbing 3-5 steps with a railing? : Total 6 Click Score: 16    End of Session Equipment Utilized During Treatment: Gait belt Activity Tolerance: Patient tolerated treatment well Patient left: in chair;with call bell/phone within reach;with chair alarm set   PT Visit Diagnosis:  Muscle weakness (generalized) (M62.81);Pain;Difficulty in walking, not elsewhere classified (R26.2);History of falling (Z91.81);Repeated falls (R29.6) Pain - Right/Left: Right Pain - part of body: Hip     Time: 1047-1106 PT Time Calculation (min) (ACUTE ONLY): 19 min  Charges:  $Gait Training: 8-22 mins                         Doreatha Massed, PT Acute Rehabilitation  Office: [phone] Pager: [phone]

## 2020-08-14 NOTE — Care Management Important Message (Signed)
Important Message  Patient Details IM Letter given to the Patient. Name: LANITRA BATTAGLINI MRN: [id number] Date of Birth: 1951-06-02   Medicare Important Message Given:  Yes     Kerin Salen 08/14/2020, 4:08 PM

## 2020-08-14 NOTE — Progress Notes (Addendum)
Report given to [place] at [facility] for discharge.

## 2020-08-14 NOTE — Progress Notes (Signed)
    Subjective:  Patient reports pain as mild to moderate.  Denies N/V/CP/SOB.   Objective:   VITALS:   Vitals:   08/13/20 1331 08/13/20 1536 08/13/20 2056 08/14/20 0516  BP: (!) 90/49 (!) 103/50 129/81 124/60  Pulse: 74 73 85 91  Resp: 20  16 16   Temp: 98.1 F (36.7 C)  99.4 F (37.4 C) 98.4 F (36.9 C)  TempSrc:   Oral Oral  SpO2: 95%  95% (!) 86%  Weight:      Height:        NAD ABD soft Intact pulses distally Dorsiflexion/Plantar flexion intact Incision: dressing C/D/I Compartment soft No hematoma, thigh soft  Lab Results  Component Value Date   WBC 6.4 08/14/2020   HGB 8.8 (L) 08/14/2020   HCT 28.4 (L) 08/14/2020   MCV 89.9 08/14/2020   PLT 161 08/14/2020   BMET    Component Value Date/Time   NA 142 08/13/2020 0203   NA 147 04/13/2017 0000   K 4.0 08/13/2020 0203   CL 111 08/13/2020 0203   CO2 22 08/13/2020 0203   GLUCOSE 94 08/13/2020 0203   BUN 9 08/13/2020 0203   BUN 11 04/13/2017 0000   CREATININE 0.60 08/13/2020 0203   CREATININE 0.95 02/15/2016 1536   CALCIUM 8.0 (L) 08/13/2020 0203   CALCIUM 9.4 01/13/2007 2356   GFRNONAA >60 08/13/2020 0203   GFRAA >60 04/04/2020 1212     Assessment/Plan: 3 Days Post-Op   Principal Problem:   Femur fracture, right (HCC) Active Problems:   Depression   GERD (gastroesophageal reflux disease)   DEGENERATION, CERVICAL DISC   Chronic pain syndrome   Recurrent falls   WBAT with walker DVT ppx: Aspirin, SCDs, TEDS PO pain control PT/OT Dispo: ok for d/c from ortho perspective    Dorothy White 08/14/2020, 12:04 PM   Rod Can, MD [phone] [place] [facility] now [facility]  [place] [address], [address], [place], [place] [address] Phone: [phone] [email or web address] Facebook  [employer]

## 2020-08-14 NOTE — TOC Transition Note (Addendum)
Transition of Care [facility]) - CM/SW Discharge Note   Patient Details  Name: Dorothy White MRN: [id number] Date of Birth: 03/11/1951  [facility] of [facility]) CM/SW Contact:  Lia Hopping, [place] Phone Number: 08/14/2020, 2:46 PM   Clinical Narrative:    Patient first SNF choice [facility] unable to accept due to a covid outbreak.  Patient chose second option [facility]. CSW attempted to reach friend Selinda Flavin however he is unavailable today due to a procedure. Patient called her brother Clare Gandy. CSW notified him of the new SNF and their contact information.  Reference# [id number]  [facility] approval provided: #[id number], 1/18-1/20, Carron Curie [phone] Details provided to SNF.  CSW provided information to SNF staff [name]. Nurse call report to: [phone] Room 108A  [facility] arranged to transport the patient.    Final next level of care: Skilled Nursing Facility Barriers to Discharge: Barriers Resolved   Patient Goals and CMS Choice Patient states their goals for this hospitalization and ongoing recovery are:: go to short rehab CMS Medicare.gov Compare Post Acute Care list provided to:: Patient [name] Candida Peeling) Choice offered to / list presented to : [name]  Discharge Placement   Existing PASRR number confirmed : 08/13/20          Patient chooses bed at:  [facility]) Patient to be transferred to facility by: [place] Name of family member notified: Brother [name] [phone], patient friend Selinda Flavin had procedure complete today unable to answer his phone Patient and family notified of of transfer: 08/14/20  Discharge Plan and Services In-house Referral: Clinical Social Work   Post Acute Care Choice: [place]                               Social Determinants of Health (SDOH) Interventions     Readmission Risk Interventions No flowsheet data found.

## 2020-08-14 NOTE — Plan of Care (Signed)
Plan of care reviewed and discussed with the patient. 

## 2020-08-15 DIAGNOSIS — E876 Hypokalemia: Secondary | ICD-10-CM | POA: Diagnosis not present

## 2020-08-15 DIAGNOSIS — S72041D Displaced fracture of base of neck of right femur, subsequent encounter for closed fracture with routine healing: Secondary | ICD-10-CM | POA: Diagnosis not present

## 2020-08-15 DIAGNOSIS — R2681 Unsteadiness on feet: Secondary | ICD-10-CM | POA: Diagnosis not present

## 2020-08-15 DIAGNOSIS — S72001A Fracture of unspecified part of neck of right femur, initial encounter for closed fracture: Secondary | ICD-10-CM | POA: Diagnosis not present

## 2020-08-15 DIAGNOSIS — S8291XD Unspecified fracture of right lower leg, subsequent encounter for closed fracture with routine healing: Secondary | ICD-10-CM | POA: Diagnosis not present

## 2020-08-15 DIAGNOSIS — F32A Depression, unspecified: Secondary | ICD-10-CM | POA: Diagnosis not present

## 2020-08-15 DIAGNOSIS — Z79899 Other long term (current) drug therapy: Secondary | ICD-10-CM | POA: Diagnosis not present

## 2020-08-15 DIAGNOSIS — M2550 Pain in unspecified joint: Secondary | ICD-10-CM | POA: Diagnosis not present

## 2020-08-15 DIAGNOSIS — Z743 Need for continuous supervision: Secondary | ICD-10-CM | POA: Diagnosis not present

## 2020-08-15 DIAGNOSIS — K219 Gastro-esophageal reflux disease without esophagitis: Secondary | ICD-10-CM | POA: Diagnosis not present

## 2020-08-15 DIAGNOSIS — R296 Repeated falls: Secondary | ICD-10-CM | POA: Diagnosis not present

## 2020-08-15 DIAGNOSIS — R5381 Other malaise: Secondary | ICD-10-CM | POA: Diagnosis not present

## 2020-08-15 DIAGNOSIS — Z760 Encounter for issue of repeat prescription: Secondary | ICD-10-CM | POA: Diagnosis not present

## 2020-08-15 DIAGNOSIS — Z7401 Bed confinement status: Secondary | ICD-10-CM | POA: Diagnosis not present

## 2020-08-15 DIAGNOSIS — I499 Cardiac arrhythmia, unspecified: Secondary | ICD-10-CM | POA: Diagnosis not present

## 2020-08-15 DIAGNOSIS — K5900 Constipation, unspecified: Secondary | ICD-10-CM | POA: Diagnosis not present

## 2020-08-15 DIAGNOSIS — R42 Dizziness and giddiness: Secondary | ICD-10-CM | POA: Diagnosis not present

## 2020-08-15 DIAGNOSIS — R0902 Hypoxemia: Secondary | ICD-10-CM | POA: Diagnosis not present

## 2020-08-15 DIAGNOSIS — R2689 Other abnormalities of gait and mobility: Secondary | ICD-10-CM | POA: Diagnosis not present

## 2020-08-15 DIAGNOSIS — M6281 Muscle weakness (generalized): Secondary | ICD-10-CM | POA: Diagnosis not present

## 2020-08-15 DIAGNOSIS — Z96641 Presence of right artificial hip joint: Secondary | ICD-10-CM | POA: Diagnosis not present

## 2020-08-15 DIAGNOSIS — G894 Chronic pain syndrome: Secondary | ICD-10-CM | POA: Diagnosis not present

## 2020-08-15 NOTE — Progress Notes (Signed)
[name] to be D/C'd to [place] per MD order.  Discussed with the patient and all questions fully answered.  VSS, Skin clean, dry and intact without evidence of skin break down, no evidence of skin tears noted. IV catheter discontinued intact. Site without signs and symptoms of complications. Dressing and pressure applied.  An After Visit Summary was printed and given to the patient.  D/c education completed with patient including follow up instructions, medication list, d/c activities limitations if indicated, with other d/c instructions as indicated by MD - patient able to verbalize understanding, all questions fully answered.   Patient instructed to return to ED, call 911, or call MD for any changes in condition.   Patient escorted via [facility].  Dorothy White 08/15/2020 12:41 AM

## 2020-08-16 DIAGNOSIS — S72001A Fracture of unspecified part of neck of right femur, initial encounter for closed fracture: Secondary | ICD-10-CM | POA: Diagnosis not present

## 2020-08-16 DIAGNOSIS — G894 Chronic pain syndrome: Secondary | ICD-10-CM | POA: Diagnosis not present

## 2020-08-17 DIAGNOSIS — K5900 Constipation, unspecified: Secondary | ICD-10-CM | POA: Diagnosis not present

## 2020-08-17 DIAGNOSIS — E876 Hypokalemia: Secondary | ICD-10-CM | POA: Diagnosis not present

## 2020-08-17 DIAGNOSIS — G894 Chronic pain syndrome: Secondary | ICD-10-CM | POA: Diagnosis not present

## 2020-08-17 DIAGNOSIS — K219 Gastro-esophageal reflux disease without esophagitis: Secondary | ICD-10-CM | POA: Diagnosis not present

## 2020-08-17 DIAGNOSIS — R42 Dizziness and giddiness: Secondary | ICD-10-CM | POA: Diagnosis not present

## 2020-08-17 DIAGNOSIS — F32A Depression, unspecified: Secondary | ICD-10-CM | POA: Diagnosis not present

## 2020-08-17 DIAGNOSIS — R5381 Other malaise: Secondary | ICD-10-CM | POA: Diagnosis not present

## 2020-09-05 DIAGNOSIS — S72041D Displaced fracture of base of neck of right femur, subsequent encounter for closed fracture with routine healing: Secondary | ICD-10-CM | POA: Diagnosis not present

## 2020-09-05 DIAGNOSIS — G894 Chronic pain syndrome: Secondary | ICD-10-CM | POA: Diagnosis not present

## 2020-09-05 DIAGNOSIS — N183 Chronic kidney disease, stage 3 unspecified: Secondary | ICD-10-CM | POA: Diagnosis not present

## 2020-09-05 DIAGNOSIS — I10 Essential (primary) hypertension: Secondary | ICD-10-CM | POA: Diagnosis not present

## 2020-09-05 DIAGNOSIS — I2510 Atherosclerotic heart disease of native coronary artery without angina pectoris: Secondary | ICD-10-CM | POA: Diagnosis not present

## 2020-09-05 DIAGNOSIS — G903 Multi-system degeneration of the autonomic nervous system: Secondary | ICD-10-CM | POA: Diagnosis not present

## 2020-09-05 DIAGNOSIS — R296 Repeated falls: Secondary | ICD-10-CM | POA: Diagnosis not present

## 2020-09-07 ENCOUNTER — Other Ambulatory Visit: Payer: Self-pay | Admitting: Neurology

## 2020-09-10 DIAGNOSIS — S72041D Displaced fracture of base of neck of right femur, subsequent encounter for closed fracture with routine healing: Secondary | ICD-10-CM | POA: Diagnosis not present

## 2020-09-10 DIAGNOSIS — S72032D Displaced midcervical fracture of left femur, subsequent encounter for closed fracture with routine healing: Secondary | ICD-10-CM | POA: Diagnosis not present

## 2020-09-10 DIAGNOSIS — G894 Chronic pain syndrome: Secondary | ICD-10-CM | POA: Diagnosis not present

## 2020-09-10 DIAGNOSIS — G903 Multi-system degeneration of the autonomic nervous system: Secondary | ICD-10-CM | POA: Diagnosis not present

## 2020-09-10 DIAGNOSIS — S72031D Displaced midcervical fracture of right femur, subsequent encounter for closed fracture with routine healing: Secondary | ICD-10-CM | POA: Diagnosis not present

## 2020-09-10 DIAGNOSIS — I10 Essential (primary) hypertension: Secondary | ICD-10-CM | POA: Diagnosis not present

## 2020-09-10 DIAGNOSIS — N183 Chronic kidney disease, stage 3 unspecified: Secondary | ICD-10-CM | POA: Diagnosis not present

## 2020-09-10 DIAGNOSIS — I2510 Atherosclerotic heart disease of native coronary artery without angina pectoris: Secondary | ICD-10-CM | POA: Diagnosis not present

## 2020-09-10 DIAGNOSIS — R296 Repeated falls: Secondary | ICD-10-CM | POA: Diagnosis not present

## 2020-09-10 NOTE — Telephone Encounter (Signed)
Rx(s) sent to pharmacy electronically.  

## 2020-09-12 DIAGNOSIS — N183 Chronic kidney disease, stage 3 unspecified: Secondary | ICD-10-CM | POA: Diagnosis not present

## 2020-09-12 DIAGNOSIS — R296 Repeated falls: Secondary | ICD-10-CM | POA: Diagnosis not present

## 2020-09-12 DIAGNOSIS — G903 Multi-system degeneration of the autonomic nervous system: Secondary | ICD-10-CM | POA: Diagnosis not present

## 2020-09-12 DIAGNOSIS — I10 Essential (primary) hypertension: Secondary | ICD-10-CM | POA: Diagnosis not present

## 2020-09-12 DIAGNOSIS — G894 Chronic pain syndrome: Secondary | ICD-10-CM | POA: Diagnosis not present

## 2020-09-12 DIAGNOSIS — I2510 Atherosclerotic heart disease of native coronary artery without angina pectoris: Secondary | ICD-10-CM | POA: Diagnosis not present

## 2020-09-12 DIAGNOSIS — S72041D Displaced fracture of base of neck of right femur, subsequent encounter for closed fracture with routine healing: Secondary | ICD-10-CM | POA: Diagnosis not present

## 2020-09-13 DIAGNOSIS — R296 Repeated falls: Secondary | ICD-10-CM | POA: Diagnosis not present

## 2020-09-13 DIAGNOSIS — G903 Multi-system degeneration of the autonomic nervous system: Secondary | ICD-10-CM | POA: Diagnosis not present

## 2020-09-13 DIAGNOSIS — I2510 Atherosclerotic heart disease of native coronary artery without angina pectoris: Secondary | ICD-10-CM | POA: Diagnosis not present

## 2020-09-13 DIAGNOSIS — G894 Chronic pain syndrome: Secondary | ICD-10-CM | POA: Diagnosis not present

## 2020-09-13 DIAGNOSIS — N183 Chronic kidney disease, stage 3 unspecified: Secondary | ICD-10-CM | POA: Diagnosis not present

## 2020-09-13 DIAGNOSIS — S72041D Displaced fracture of base of neck of right femur, subsequent encounter for closed fracture with routine healing: Secondary | ICD-10-CM | POA: Diagnosis not present

## 2020-09-13 DIAGNOSIS — I10 Essential (primary) hypertension: Secondary | ICD-10-CM | POA: Diagnosis not present

## 2020-09-17 DIAGNOSIS — I10 Essential (primary) hypertension: Secondary | ICD-10-CM | POA: Diagnosis not present

## 2020-09-17 DIAGNOSIS — N183 Chronic kidney disease, stage 3 unspecified: Secondary | ICD-10-CM | POA: Diagnosis not present

## 2020-09-17 DIAGNOSIS — I2510 Atherosclerotic heart disease of native coronary artery without angina pectoris: Secondary | ICD-10-CM | POA: Diagnosis not present

## 2020-09-17 DIAGNOSIS — G903 Multi-system degeneration of the autonomic nervous system: Secondary | ICD-10-CM | POA: Diagnosis not present

## 2020-09-17 DIAGNOSIS — S72041D Displaced fracture of base of neck of right femur, subsequent encounter for closed fracture with routine healing: Secondary | ICD-10-CM | POA: Diagnosis not present

## 2020-09-17 DIAGNOSIS — R296 Repeated falls: Secondary | ICD-10-CM | POA: Diagnosis not present

## 2020-09-17 DIAGNOSIS — G894 Chronic pain syndrome: Secondary | ICD-10-CM | POA: Diagnosis not present

## 2020-09-19 DIAGNOSIS — I2510 Atherosclerotic heart disease of native coronary artery without angina pectoris: Secondary | ICD-10-CM | POA: Diagnosis not present

## 2020-09-19 DIAGNOSIS — S72041D Displaced fracture of base of neck of right femur, subsequent encounter for closed fracture with routine healing: Secondary | ICD-10-CM | POA: Diagnosis not present

## 2020-09-19 DIAGNOSIS — R296 Repeated falls: Secondary | ICD-10-CM | POA: Diagnosis not present

## 2020-09-19 DIAGNOSIS — N183 Chronic kidney disease, stage 3 unspecified: Secondary | ICD-10-CM | POA: Diagnosis not present

## 2020-09-19 DIAGNOSIS — G894 Chronic pain syndrome: Secondary | ICD-10-CM | POA: Diagnosis not present

## 2020-09-19 DIAGNOSIS — G903 Multi-system degeneration of the autonomic nervous system: Secondary | ICD-10-CM | POA: Diagnosis not present

## 2020-09-19 DIAGNOSIS — I10 Essential (primary) hypertension: Secondary | ICD-10-CM | POA: Diagnosis not present

## 2020-09-24 DIAGNOSIS — N183 Chronic kidney disease, stage 3 unspecified: Secondary | ICD-10-CM | POA: Diagnosis not present

## 2020-09-24 DIAGNOSIS — R296 Repeated falls: Secondary | ICD-10-CM | POA: Diagnosis not present

## 2020-09-24 DIAGNOSIS — I10 Essential (primary) hypertension: Secondary | ICD-10-CM | POA: Diagnosis not present

## 2020-09-24 DIAGNOSIS — S72041D Displaced fracture of base of neck of right femur, subsequent encounter for closed fracture with routine healing: Secondary | ICD-10-CM | POA: Diagnosis not present

## 2020-09-24 DIAGNOSIS — G903 Multi-system degeneration of the autonomic nervous system: Secondary | ICD-10-CM | POA: Diagnosis not present

## 2020-09-24 DIAGNOSIS — I2510 Atherosclerotic heart disease of native coronary artery without angina pectoris: Secondary | ICD-10-CM | POA: Diagnosis not present

## 2020-09-24 DIAGNOSIS — G894 Chronic pain syndrome: Secondary | ICD-10-CM | POA: Diagnosis not present

## 2020-09-25 DIAGNOSIS — N183 Chronic kidney disease, stage 3 unspecified: Secondary | ICD-10-CM | POA: Diagnosis not present

## 2020-09-25 DIAGNOSIS — R296 Repeated falls: Secondary | ICD-10-CM | POA: Diagnosis not present

## 2020-09-25 DIAGNOSIS — I10 Essential (primary) hypertension: Secondary | ICD-10-CM | POA: Diagnosis not present

## 2020-09-25 DIAGNOSIS — G894 Chronic pain syndrome: Secondary | ICD-10-CM | POA: Diagnosis not present

## 2020-09-25 DIAGNOSIS — G903 Multi-system degeneration of the autonomic nervous system: Secondary | ICD-10-CM | POA: Diagnosis not present

## 2020-09-25 DIAGNOSIS — S72041D Displaced fracture of base of neck of right femur, subsequent encounter for closed fracture with routine healing: Secondary | ICD-10-CM | POA: Diagnosis not present

## 2020-09-25 DIAGNOSIS — I2510 Atherosclerotic heart disease of native coronary artery without angina pectoris: Secondary | ICD-10-CM | POA: Diagnosis not present

## 2020-09-26 DIAGNOSIS — R296 Repeated falls: Secondary | ICD-10-CM | POA: Diagnosis not present

## 2020-09-26 DIAGNOSIS — G903 Multi-system degeneration of the autonomic nervous system: Secondary | ICD-10-CM | POA: Diagnosis not present

## 2020-09-26 DIAGNOSIS — I10 Essential (primary) hypertension: Secondary | ICD-10-CM | POA: Diagnosis not present

## 2020-09-26 DIAGNOSIS — S72041D Displaced fracture of base of neck of right femur, subsequent encounter for closed fracture with routine healing: Secondary | ICD-10-CM | POA: Diagnosis not present

## 2020-09-26 DIAGNOSIS — I2510 Atherosclerotic heart disease of native coronary artery without angina pectoris: Secondary | ICD-10-CM | POA: Diagnosis not present

## 2020-09-26 DIAGNOSIS — G894 Chronic pain syndrome: Secondary | ICD-10-CM | POA: Diagnosis not present

## 2020-09-26 DIAGNOSIS — N183 Chronic kidney disease, stage 3 unspecified: Secondary | ICD-10-CM | POA: Diagnosis not present

## 2020-09-27 DIAGNOSIS — R296 Repeated falls: Secondary | ICD-10-CM | POA: Diagnosis not present

## 2020-09-27 DIAGNOSIS — I2510 Atherosclerotic heart disease of native coronary artery without angina pectoris: Secondary | ICD-10-CM | POA: Diagnosis not present

## 2020-09-27 DIAGNOSIS — N183 Chronic kidney disease, stage 3 unspecified: Secondary | ICD-10-CM | POA: Diagnosis not present

## 2020-09-27 DIAGNOSIS — S72041D Displaced fracture of base of neck of right femur, subsequent encounter for closed fracture with routine healing: Secondary | ICD-10-CM | POA: Diagnosis not present

## 2020-09-27 DIAGNOSIS — I10 Essential (primary) hypertension: Secondary | ICD-10-CM | POA: Diagnosis not present

## 2020-09-27 DIAGNOSIS — G894 Chronic pain syndrome: Secondary | ICD-10-CM | POA: Diagnosis not present

## 2020-09-27 DIAGNOSIS — G903 Multi-system degeneration of the autonomic nervous system: Secondary | ICD-10-CM | POA: Diagnosis not present

## 2020-09-28 DIAGNOSIS — M79651 Pain in right thigh: Secondary | ICD-10-CM | POA: Diagnosis not present

## 2020-09-28 DIAGNOSIS — M25522 Pain in left elbow: Secondary | ICD-10-CM | POA: Diagnosis not present

## 2020-09-28 DIAGNOSIS — M25551 Pain in right hip: Secondary | ICD-10-CM | POA: Diagnosis not present

## 2020-10-01 DIAGNOSIS — I2510 Atherosclerotic heart disease of native coronary artery without angina pectoris: Secondary | ICD-10-CM | POA: Diagnosis not present

## 2020-10-01 DIAGNOSIS — G894 Chronic pain syndrome: Secondary | ICD-10-CM | POA: Diagnosis not present

## 2020-10-01 DIAGNOSIS — G903 Multi-system degeneration of the autonomic nervous system: Secondary | ICD-10-CM | POA: Diagnosis not present

## 2020-10-01 DIAGNOSIS — I10 Essential (primary) hypertension: Secondary | ICD-10-CM | POA: Diagnosis not present

## 2020-10-01 DIAGNOSIS — R296 Repeated falls: Secondary | ICD-10-CM | POA: Diagnosis not present

## 2020-10-01 DIAGNOSIS — N183 Chronic kidney disease, stage 3 unspecified: Secondary | ICD-10-CM | POA: Diagnosis not present

## 2020-10-01 DIAGNOSIS — S72041D Displaced fracture of base of neck of right femur, subsequent encounter for closed fracture with routine healing: Secondary | ICD-10-CM | POA: Diagnosis not present

## 2020-10-02 ENCOUNTER — Emergency Department (HOSPITAL_COMMUNITY): Payer: Medicare Other

## 2020-10-02 ENCOUNTER — Other Ambulatory Visit: Payer: Self-pay

## 2020-10-02 ENCOUNTER — Emergency Department (HOSPITAL_COMMUNITY)
Admission: EM | Admit: 2020-10-02 | Discharge: 2020-10-02 | Disposition: A | Discharge status: Home / Self Care | Payer: Medicare Other | Attending: Emergency Medicine | Admitting: Emergency Medicine

## 2020-10-02 DIAGNOSIS — S7011XA Contusion of right thigh, initial encounter: Secondary | ICD-10-CM

## 2020-10-02 DIAGNOSIS — Z85828 Personal history of other malignant neoplasm of skin: Secondary | ICD-10-CM | POA: Diagnosis not present

## 2020-10-02 DIAGNOSIS — R52 Pain, unspecified: Secondary | ICD-10-CM

## 2020-10-02 DIAGNOSIS — I10 Essential (primary) hypertension: Secondary | ICD-10-CM | POA: Diagnosis not present

## 2020-10-02 DIAGNOSIS — W01198A Fall on same level from slipping, tripping and stumbling with subsequent striking against other object, initial encounter: Secondary | ICD-10-CM | POA: Insufficient documentation

## 2020-10-02 DIAGNOSIS — I2510 Atherosclerotic heart disease of native coronary artery without angina pectoris: Secondary | ICD-10-CM | POA: Diagnosis not present

## 2020-10-02 DIAGNOSIS — R296 Repeated falls: Secondary | ICD-10-CM | POA: Diagnosis not present

## 2020-10-02 DIAGNOSIS — M25552 Pain in left hip: Secondary | ICD-10-CM | POA: Diagnosis not present

## 2020-10-02 DIAGNOSIS — S72041D Displaced fracture of base of neck of right femur, subsequent encounter for closed fracture with routine healing: Secondary | ICD-10-CM | POA: Diagnosis not present

## 2020-10-02 DIAGNOSIS — S0990XA Unspecified injury of head, initial encounter: Secondary | ICD-10-CM

## 2020-10-02 DIAGNOSIS — M25551 Pain in right hip: Secondary | ICD-10-CM | POA: Diagnosis not present

## 2020-10-02 DIAGNOSIS — R0789 Other chest pain: Secondary | ICD-10-CM | POA: Insufficient documentation

## 2020-10-02 DIAGNOSIS — R9431 Abnormal electrocardiogram [ECG] [EKG]: Secondary | ICD-10-CM | POA: Diagnosis not present

## 2020-10-02 DIAGNOSIS — G903 Multi-system degeneration of the autonomic nervous system: Secondary | ICD-10-CM | POA: Diagnosis not present

## 2020-10-02 DIAGNOSIS — Z96643 Presence of artificial hip joint, bilateral: Secondary | ICD-10-CM | POA: Insufficient documentation

## 2020-10-02 DIAGNOSIS — R079 Chest pain, unspecified: Secondary | ICD-10-CM | POA: Diagnosis not present

## 2020-10-02 DIAGNOSIS — W19XXXA Unspecified fall, initial encounter: Secondary | ICD-10-CM

## 2020-10-02 DIAGNOSIS — F17210 Nicotine dependence, cigarettes, uncomplicated: Secondary | ICD-10-CM | POA: Insufficient documentation

## 2020-10-02 DIAGNOSIS — N183 Chronic kidney disease, stage 3 unspecified: Secondary | ICD-10-CM | POA: Diagnosis not present

## 2020-10-02 DIAGNOSIS — M7918 Myalgia, other site: Secondary | ICD-10-CM | POA: Diagnosis not present

## 2020-10-02 DIAGNOSIS — G894 Chronic pain syndrome: Secondary | ICD-10-CM | POA: Diagnosis not present

## 2020-10-02 LAB — COMPREHENSIVE METABOLIC PANEL
ALT: 11 U/L (ref 0–44)
AST: 13 U/L — ABNORMAL LOW (ref 15–41)
Albumin: 3.4 g/dL — ABNORMAL LOW (ref 3.5–5.0)
Alkaline Phosphatase: 115 U/L (ref 38–126)
Anion gap: 9 (ref 5–15)
BUN: 13 mg/dL (ref 8–23)
CO2: 21 mmol/L — ABNORMAL LOW (ref 22–32)
Calcium: 9.2 mg/dL (ref 8.9–10.3)
Chloride: 106 mmol/L (ref 98–111)
Creatinine, Ser: 0.7 mg/dL (ref 0.44–1.00)
GFR, Estimated: >60 mL/min (ref >60)
Glucose, Bld: 100 mg/dL — ABNORMAL HIGH (ref 70–99)
Potassium: 3.9 mmol/L (ref 3.5–5.1)
Sodium: 136 mmol/L (ref 135–145)
Total Bilirubin: 0.6 mg/dL (ref 0.3–1.2)
Total Protein: 6.3 g/dL — ABNORMAL LOW (ref 6.5–8.1)

## 2020-10-02 LAB — CBC
HCT: 35.4 % — ABNORMAL LOW (ref 36.0–46.0)
Hemoglobin: 10.5 g/dL — ABNORMAL LOW (ref 12.0–15.0)
MCH: 25.8 pg — ABNORMAL LOW (ref 26.0–34.0)
MCHC: 29.7 g/dL — ABNORMAL LOW (ref 30.0–36.0)
MCV: 87 fL (ref 80.0–100.0)
Platelets: 253 10*3/uL (ref 150–400)
RBC: 4.07 MIL/uL (ref 3.87–5.11)
RDW: 16.1 % — ABNORMAL HIGH (ref 11.5–15.5)
WBC: 7.2 10*3/uL (ref 4.0–10.5)
nRBC: 0 % (ref 0.0–0.2)

## 2020-10-02 LAB — URINALYSIS, ROUTINE W REFLEX MICROSCOPIC
Bilirubin Urine: NEGATIVE
Glucose, UA: NEGATIVE mg/dL
Hgb urine dipstick: NEGATIVE
Ketones, ur: NEGATIVE mg/dL
Leukocytes,Ua: NEGATIVE
Nitrite: NEGATIVE
Protein, ur: NEGATIVE mg/dL
Specific Gravity, Urine: 1.01 (ref 1.005–1.030)
pH: 6 (ref 5.0–8.0)

## 2020-10-02 LAB — TROPONIN I (HIGH SENSITIVITY): Troponin I (High Sensitivity): 3 ng/L (ref <18)

## 2020-10-02 LAB — CBG MONITORING, ED: Glucose-Capillary: 86 mg/dL (ref 70–99)

## 2020-10-02 MED ORDER — HYDROCODONE-ACETAMINOPHEN 5-325 MG PO TABS
1.0000 | ORAL_TABLET | Freq: Once | ORAL | Status: AC
  Administered 2020-10-02: 1 via ORAL
  Filled 2020-10-02: qty 1

## 2020-10-02 NOTE — ED Provider Notes (Signed)
[facility] EMERGENCY DEPARTMENT Provider Note   CSN: [id number] Arrival date & time: 10/02/20  1326     History Chief Complaint  Patient presents with  . Fall    Dorothy White is a 70 y.o. female.  HPI Patient is a 71 year old female presented today with diffuse body pain.  She states that she had a mechanical fall yesterday evening approximately 11 PM when she fell backwards and hit her head against a concrete ground.  She states that she did not lose consciousness and states that she has had a mild achy headache since.  She states that she laid there for several minutes before she got up and crawled a few feet before she stood up and was able to walk.  She states that she has some baseline gait issues.  This has been noted in the EMR. She has also endorsed arthritis that affects "my entire body "it seems that this is been an issue for several years.   She also states that she has bilateral hip pain.  She cannot recall if this got worse after the fall.  She does have chronic hip pain.  Patient states that she has felt more fatigued and had intermittent headaches since the fall.  She denies any other significant injuries apart from her head and both her hips.  She is having some difficulty determining whether her hip pain is new or just the old pain that she usually has and she is requesting her chronic pain medicine which is Percocet 10.  I discussed with patient's friend and roommate Algis Liming Mr. Rich Reining relates the patient did have a fall yesterday was able to get back up but was complaining of diffuse body pain after a fall.  He states that she has not been significantly altered since his episode although she has seemed somewhat more slow and less amiable than usual.     Past Medical History:  Diagnosis Date  . Anemia    ?  Marland Kitchen Anxiety   . Arthritis    "everywhere" (04/25/2016)  . 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 Chronic back pain    "all  over" (04/25/2016)  . Constipation   . Depression   . Diverticulitis   . GERD (gastroesophageal reflux disease)   . Head injury 2019   subdural hematoma  . Headache    "weekly" (04/25/2016)  . 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 History of blood transfusion   . HLD (hyperlipidemia)    hx (04/25/2016)  . 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 Mild CAD    a. 25% mLAD, otherwise no sig disease 01/2016 cath.  Marland Kitchen NICM (nonischemic cardiomyopathy) ([place])    a. EF 40-45% by echo 03/8118 at time of complicated migraine/neuro sx, 55-65% at time of cath 01/2016  . Stroke [facility]) 06/2013   "mini" stroke , ?possibly hemaplegic migraine per pt    Patient Active Problem List   Diagnosis Date Noted  . Femur fracture, right ([place]) 08/09/2020  . Closed comminuted fracture of proximal ulna, left, initial encounter 04/04/2020  . Protein-calorie malnutrition, severe 07/04/2018  . UTI (urinary tract infection) 07/03/2018  . Nasal bone fractures 07/03/2018  . Knee pain 07/03/2018  . Normal anion gap metabolic acidosis 14/78/2956  . Subdural hematoma ([place]) 07/02/2018  . Recurrent falls 04/16/2017  . 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 At risk for adverse drug event 04/07/2017  .  Acute blood loss as cause of postoperative anemia 04/04/2017  . Acute delirium 04/04/2017  . Osteoporosis 04/02/2017  . Closed fracture of neck of left femur ([place]) 03/31/2017  . Scalp contusion 03/31/2017  . Fall at home, initial encounter 03/31/2017  . Hip fracture ([place]) 03/31/2017  . 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 Hx of transient ischemic attack (TIA) 02/19/2017  . Left carotid stenosis- 40-59% 02/19/2017  . Protein calorie malnutrition ([place]) 02/18/2017  . Falls 02/17/2017  . Chest pain 09/26/2016  . Diverticulitis of sigmoid colon 04/25/2016  . Infection due to ESBL-producing Escherichia coli/Diverticular Abscess 03/15/2016  . Diverticulitis  03/12/2016  . Chronic pain syndrome 03/12/2016  . Lesion of pancreas 03/12/2016  . History of chest pain 03/12/2016  . Hypokalemia 03/12/2016  . Angina, class IV ([place]) - chest tightness and pressure with dyspnea with minimal exertion 02/17/2016    Class: Question of  . TIA (transient ischemic attack) 12/26/2015  . DOE (dyspnea on exertion) 12/26/2015  . Right sided weakness 12/17/2015  . Ataxia 12/17/2015  . Shoulder fracture 02/15/2015  . Chronic headache 10/11/2013  . Weakness generalized 08/12/2013  . Dizziness and giddiness 08/12/2013  . Abnormality of gait 07/11/2013  . Hyperlipidemia 05/07/2007  . Migraine 05/07/2007  . PREMATURE VENTRICULAR CONTRACTIONS, FREQUENT 05/07/2007  . GERD (gastroesophageal reflux disease) 05/07/2007  . DIVERTICULOSIS, COLON 05/07/2007  . 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 OSTEOPENIA 05/07/2007  . Depression 03/24/2007  . HEMORRHOIDS 03/24/2007  . ALLERGIC RHINITIS 03/24/2007  . LOW BACK PAIN 03/24/2007  . MIGRAINES, HX OF 03/24/2007    Past Surgical History:  Procedure Laterality Date  . ANTERIOR APPROACH HEMI HIP ARTHROPLASTY Left 04/01/2017   Procedure: LEFT DIRECT ANTERIOR TOTAL HIP REPLACEMENT;  Surgeon: Leandrew Koyanagi, MD;  Location: [facility];  Service: Orthopedics;  Laterality: Left;  LEFT DIRECT ANTERIOR TOTAL HIP REPLACEMENT  . AUGMENTATION MAMMAPLASTY  1980  . BASAL CELL CARCINOMA EXCISION     "tip of my nose"  . BUNIONECTOMY Bilateral 10/2003  . CARDIAC CATHETERIZATION N/A 02/20/2016   Procedure: Left Heart Cath and Coronary Angiography;  Surgeon: Jettie Booze, MD;  Location: [facility];  Service: Cardiovascular;  Laterality: N/A;  . COLONOSCOPY    . DILATION AND CURETTAGE OF UTERUS    . FRACTURE SURGERY    . IR GENERIC HISTORICAL  03/18/2016   IR SINUS/FIST TUBE CHK-NON GI 03/18/2016 Aletta Edouard, MD [facility]-INTER[facility] RAD  . LAPAROSCOPIC SIGMOID COLECTOMY N/A 04/25/2016   Procedure: LAPAROSCOPIC SIGMOID COLECTOMY;   Surgeon: Stark Klein, MD;  Location: [facility];  Service: General;  Laterality: N/A;  . OOPHORECTOMY Bilateral ~ 1999  . ORIF HUMERUS FRACTURE Right 02/15/2015   Procedure: OPEN REDUCTION INTERNAL FIXATION (ORIF) PROXIMAL HUMERUS FRACTURE;  Surgeon: Netta Cedars, MD;  Location: [facility];  Service: Orthopedics;  Laterality: Right;  . ORIF HUMERUS FRACTURE Left 04/04/2020  . ORIF HUMERUS FRACTURE Left 04/04/2020   Procedure: OPEN REDUCTION INTERNAL FIXATION (ORIF) PROXIMAL HUMERUS FRACTURE;  Surgeon: Iran Planas, MD;  Location: [facility];  Service: Orthopedics;  Laterality: Left;  regional block  . ORIF ULNAR FRACTURE Left 04/04/2020  . ORIF ULNAR FRACTURE Left 04/04/2020   Procedure: OPEN REDUCTION INTERNAL FIXATION (ORIF) ULNAR FRACTURE;  Surgeon: Iran Planas, MD;  Location: [facility];  Service: Orthopedics;  Laterality: Left;  with regional block  . RHINOPLASTY  1976  . TONSILLECTOMY    . TOTAL HIP ARTHROPLASTY Right 08/11/2020   Procedure: TOTAL HIP ARTHROPLASTY ANTERIOR APPROACH;  Surgeon: Rod Can, MD;  Location: [facility];  Service: Orthopedics;  Laterality: Right;  . TUBAL LIGATION  ~ 1983  . VAGINAL HYSTERECTOMY  ~ 1998     OB History    Gravida  0   Para  0   Term  0   Preterm  0   AB  0   Living  0     SAB  0   IAB  0   Ectopic  0   Multiple  0   Live Births  0           Family History  Problem Relation Age of Onset  . Lung cancer Mother 37  . Migraines Mother   . Heart attack Father        Vague  . Hypertension Brother   . Esophageal cancer Brother   . Diabetes Paternal Grandmother        questionable  . Colon cancer Neg Hx   . Kidney disease Neg Hx   . Liver disease Neg Hx     Social History   Tobacco Use  . Smoking status: Current Some Day Smoker    Packs/day: 0.25    Years: 34.00    Pack years: 8.50    Types: Cigarettes  . Smokeless tobacco: Never Used  Vaping Use  . Vaping Use: Never used  Substance Use Topics  . Alcohol use: Not Currently     Alcohol/week: 1.0 standard drink    Types: 1 Standard drinks or equivalent per week    Comment: occ   . Drug use: No    Types: Cocaine    Comment: Denies any drug use 02/19/18    Home Medications Prior to Admission medications   Medication Sig Start Date End Date Taking? Authorizing Provider  carbidopa-levodopa (SINEMET IR) 25-100 MG tablet TAKE 1 TABLET BY MOUTH 3 TIMES A DAY 7AM/11AM/4PM 09/10/20   Tat, Octaviano Batty, DO  clonazePAM (KLONOPIN) 1 MG tablet Take 0.5 mg by mouth at bedtime as needed for anxiety.  03/21/20   [provider]  docusate sodium (COLACE) 100 MG capsule Take 1 capsule (100 mg total) by mouth 2 (two) times daily. 08/14/20   Burnadette Pop, MD  feeding supplement, ENSURE ENLIVE, (ENSURE ENLIVE) LIQD Take 237 mLs by mouth 3 (three) times daily between meals. Patient not taking: Reported on 08/09/2020 07/06/18   Kathlen Mody, MD  FLUoxetine (PROZAC) 40 MG capsule Take 80 mg by mouth daily. 04/18/18   [provider]  gabapentin (NEURONTIN) 100 MG capsule Take 200 mg by mouth 3 (three) times daily.    [provider]  Magnesium 500 MG TABS Take 500 mg by mouth daily.  Patient not taking: Reported on 08/09/2020    [provider]  mometasone (NASONEX) 50 MCG/ACT nasal spray Place 2 sprays into the nose daily. Patient not taking: Reported on 08/09/2020 04/22/17   Medina-Vargas, Monina C, NP  Multiple Vitamin (MULTIVITAMIN WITH MINERALS) TABS tablet Take 1 tablet by mouth daily. Patient not taking: Reported on 08/09/2020 07/07/18   Kathlen Mody, MD  omeprazole (PRILOSEC) 20 MG capsule Take 20 mg by mouth daily.    [provider]  ondansetron (ZOFRAN) 8 MG tablet Take 1 tablet (8 mg total) by mouth every 8 (eight) hours as needed for nausea or vomiting. Patient not taking: Reported on 08/09/2020 04/22/17   Medina-Vargas, Monina C, NP  oxyCODONE 10 MG TABS Take 1 tablet (10 mg total) by mouth every 6 (six) hours as needed for moderate  pain.  08/14/20   Swinteck, Aaron Edelman, MD  polyethylene glycol (MIRALAX) 17 g packet Take 17 g by mouth daily as needed. 08/14/20   Shelly Coss, MD  topiramate (TOPAMAX) 100 MG tablet Take 1 tablet (100 mg total) by mouth at bedtime. 12/28/17   Kathrynn Ducking, MD  traZODone (DESYREL) 100 MG tablet Take 200 mg by mouth at bedtime. 07/16/20   [provider]  trazodone (DESYREL) 300 MG tablet Take 1 tablet (300 mg total) by mouth at bedtime as needed for sleep. Patient not taking: Reported on 08/09/2020 04/22/17   Medina-Vargas, Monina C, NP  vitamin B-12 (CYANOCOBALAMIN) 1000 MCG tablet Take 1 tablet (1,000 mcg total) by mouth daily. Patient not taking: Reported on 08/09/2020 02/19/17   Debbe Odea, MD    Allergies    Opana [oxymorphone hcl] and Penicillins  Review of Systems   Review of Systems  Constitutional: Positive for fatigue. Negative for fever.       "Whole body pain "  HENT: Negative for congestion.   Respiratory: Negative for shortness of breath.   Cardiovascular: Positive for chest pain.  Gastrointestinal: Negative for abdominal distention, constipation, diarrhea, nausea and vomiting.  Musculoskeletal: Negative for back pain.  Neurological: Positive for headaches. Negative for dizziness.    Physical Exam Updated Vital Signs BP (!) 141/81   Pulse 80   Temp 98.7 F (37.1 C) (Oral)   Resp 11   SpO2 98%   Physical Exam Vitals and nursing note reviewed.  Constitutional:      General: She is not in acute distress.    Comments: 70 year old female patient no acute distress sitting in bed.  She was able to ambulate to the bed after getting out of her wheelchair and took several steps unaided.  She is complaining of severe pain that includes her entire body.  HENT:     Head: Normocephalic and atraumatic.     Comments: Head is atraumatic with no bruising or contusions or lacerations or abrasions.    Nose: Nose normal.     Mouth/Throat:     Mouth: Mucous membranes are  moist.  Eyes:     General: No scleral icterus. Cardiovascular:     Rate and Rhythm: Normal rate and regular rhythm.     Pulses: Normal pulses.     Heart sounds: Normal heart sounds.  Pulmonary:     Effort: Pulmonary effort is normal. No respiratory distress.     Breath sounds: No wheezing.     Comments: Patient extended touch over the mid sternum.  Also no systemic symptoms over the clavicles bilaterally. Chest:     Chest wall: Tenderness present.  Abdominal:     Palpations: Abdomen is soft.     Tenderness: There is no abdominal tenderness. There is no right CVA tenderness, left CVA tenderness, guarding or rebound.  Musculoskeletal:     Cervical back: Normal range of motion.     Right lower leg: No edema.     Left lower leg: No edema.     Comments: No focal tenderness palpation of the C, T, L-spine. Diffuse bilateral hip tenderness to palpation full range of motion of all upper and lower extremity joints. Patient's tenderness on exam seems out of proportion with her general well-appearing presentation.  Skin:    General: Skin is warm and dry.     Capillary Refill: Capillary refill takes less than 2 seconds.     Comments: Approximately 3 cm faint bruise on the proximal lateral right thigh approximately 6  cm below the acetabulum.  Diffuse bilateral hip tenderness to palpation.  Full range of motion of hips without difficulty.  Neurological:     Mental Status: She is alert. Mental status is at baseline.  Psychiatric:        Mood and Affect: Mood normal.        Behavior: Behavior normal.     ED Results / Procedures / Treatments   Labs (all labs ordered are listed, but only abnormal results are displayed) Labs Reviewed  COMPREHENSIVE METABOLIC PANEL - Abnormal; Notable for the following components:      Result Value   CO2 21 (*)    Glucose, Bld 100 (*)    Total Protein 6.3 (*)    Albumin 3.4 (*)    AST 13 (*)    All other components within normal limits  CBC - Abnormal;  Notable for the following components:   Hemoglobin 10.5 (*)    HCT 35.4 (*)    MCH 25.8 (*)    MCHC 29.7 (*)    RDW 16.1 (*)    All other components within normal limits  URINALYSIS, ROUTINE W REFLEX MICROSCOPIC  CBG MONITORING, ED  TROPONIN I (HIGH SENSITIVITY)    EKG None  Radiology DG Chest 1 View  Result Date: 10/02/2020 CLINICAL DATA:  Fall, chest pain EXAM: CHEST  1 VIEW COMPARISON:  08/12/2020 FINDINGS: No new consolidation or edema. Stable chronic interstitial changes. No pleural effusion or pneumothorax. Stable cardiomediastinal contours. Breast implant calcifications bilaterally. Partially imaged bilateral humeral fixation hardware. IMPRESSION: No acute process in the chest. Electronically Signed   By: Macy Mis M.D.   On: 10/02/2020 15:19   CT Head Wo Contrast  Result Date: 10/02/2020 CLINICAL DATA:  Status post fall, head trauma EXAM: CT HEAD WITHOUT CONTRAST TECHNIQUE: Contiguous axial images were obtained from the base of the skull through the vertex without intravenous contrast. COMPARISON:  10/26/2018 FINDINGS: Brain: No evidence of acute infarction, hemorrhage, extra-axial collection, ventriculomegaly, or mass effect. Generalized cerebral atrophy. Periventricular white matter low attenuation likely secondary to microangiopathy. Vascular: Cerebrovascular atherosclerotic calcifications are noted. Skull: Negative for fracture or focal lesion. Sinuses/Orbits: Visualized portions of the orbits are unremarkable. Left frontoethmoidal recess mucosal thickening. Visualized portions of the mastoid air cells are unremarkable. Other: None. IMPRESSION: 1. No acute intracranial pathology. 2. Chronic microvascular disease and cerebral atrophy. Electronically Signed   By: Kathreen Devoid   On: 10/02/2020 14:54   DG HIP UNILAT WITH PELVIS 2-3 VIEWS LEFT  Result Date: 10/02/2020 CLINICAL DATA:  Bilateral hip pain.  Fall at home yesterday. EXAM: DG HIP (WITH OR WITHOUT PELVIS) 2-3V LEFT  COMPARISON:  Radiograph 04/19/2018 FINDINGS: Left hip arthroplasty in expected alignment. There is no periprosthetic lucency or fracture. The pubic rami are intact. No fracture of the bony pelvis. Pubic symphysis and sacroiliac joints are congruent. IMPRESSION: Left hip arthroplasty without complication or acute fracture. Electronically Signed   By: Keith Rake M.D.   On: 10/02/2020 15:20   DG HIP UNILAT WITH PELVIS 2-3 VIEWS RIGHT  Result Date: 10/02/2020 CLINICAL DATA:  Bilateral hip pain after fall at home yesterday. EXAM: DG HIP (WITH OR WITHOUT PELVIS) 2-3V RIGHT COMPARISON:  Postoperative radiograph 08/11/2020 FINDINGS: Right hip arthroplasty unchanged in alignment. There is no periprosthetic lucency or fracture. Pubic rami are intact. No acute fracture of the bony pelvis. Previous postsurgical soft tissue changes have resolved. IMPRESSION: Right hip arthroplasty without complication or acute fracture. Electronically Signed   By: Keith Rake  M.D.   On: 10/02/2020 15:21    Procedures Procedures   Medications Ordered in ED Medications  HYDROcodone-acetaminophen (NORCO/VICODIN) 5-325 MG per tablet 1 tablet (1 tablet Oral Given 10/02/20 1557)    ED Course  I have reviewed the triage vital signs and the nursing notes.  Pertinent labs & imaging results that were available during my care of the patient were reviewed by me and considered in my medical decision making (see chart for details).  Patient is 70 year old female with myriad chronic issues discussed in HPI presented today after a fall that occurred yesterday was mechanical fall she states she fell in her driveway and struck the back of her head against the concrete ground from standing.  She did not lose consciousness but she states that she has had a mild headache since.  She denies any nausea or vomiting.  She is endorsing hip pain which she states is chronic for her however she feels that it is worse today.  She also states she  has not taken her medication this morning.  Clinical Course as of 10/02/20 1615  Tue Oct 02, 2020  1452 Hemoglobin(!): 10.5 Anemia present but improved from prior. [WF]  1529 CT head is unremarkable.  No intracranial hemorrhage.  No subdural, epidural or subarachnoid hemorrhage.  Chest x-ray without any wide mediastinum or evidence of infection such as an infiltrate.  No pneumothorax.  Bilateral hips are fractured and well-appearing on x-ray. [WF]  1530 ED EKG EKG is nonischemic [WF]  1530 CMP without significant abnormality. [WF]  1609 Urinalysis, Routine w reflex microscopic Urinalysis unremarkable [WF]    Clinical Course User Index [WF] Tedd Sias, PA   MDM Rules/Calculators/A&P                          Notably, patient seems very comfortable with walking and during entirety of history taking and has no difficulty moving her arms and legs spontaneously but does endorse severe pain with palpation of multiple areas of her body that are without bruising, deformity, abrasions lacerations or other evidence of trauma.  Patient ambulatory during initial evaluation at time of discharge.  Questions answered to the best of my ability.  Final Clinical Impression(s) / ED Diagnoses Final diagnoses:  Fall, initial encounter  Injury of head, initial encounter  Contusion of right thigh, initial encounter  Hip pain, bilateral  Body aches    Rx / DC Orders ED Discharge Orders    None       Tedd Sias, [place] 10/02/20 1717    Charlesetta Shanks, MD 10/02/20 1719

## 2020-10-02 NOTE — ED Notes (Signed)
Pt being transported to Xray 

## 2020-10-02 NOTE — ED Notes (Signed)
Pt back from x-ray.

## 2020-10-02 NOTE — ED Triage Notes (Addendum)
Pt reports she is here today due to fall. Pt states last night she tripped. Pt reports she seem confused and disoriented. Pt is alert in triage.Pt came with a note that states she seem disoriented and confused since fall.Pt denies any blood thinners.

## 2020-10-02 NOTE — Discharge Instructions (Signed)
Your CT scan, x-rays and work-up today was reassuringly normal.  There is no evidence of fractures or other abnormalities.  Please follow-up with your primary care doctor for continued care.  Please take your home pain meds including her gabapentin and narcotic pain medicine.  Patient plenty of water.  You may always return to the ER for any new or concerning symptoms

## 2020-10-02 NOTE — Progress Notes (Signed)
Assessment/Plan:   1.  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  She has had very serious consequences of falls including fractures and subdural hematoma.  Discussed with patient that I no longer want her to be able to walk.  I need her in a wheelchair at all times.  Discussed power WC. Pt needs power WC as she has difficulty getting to bathroom on time. Pt having trouble navigating getting in and out of shower and power WC would be of great benefit with this task that walker is of no benefit for.  In addition, multiple serious falls with the walker.  Pt has the mental capability to use the power WC.  Need to refer to PT for WC eval.  She cannot use manual WC because of endurance abilities to propel and poor coordination of UE's. she could not mount a scooter/transfer onto safely, hence making it inappropriate for this patient.   Rollator not appropriate as multiple falls with this and traditional walker.  Ultimately, patient was really very upset about the discussion, both over the power wheelchair as well as the degenerative nature of the disease.  She had trouble processing it.  We decided to bring her back in the next 4 to 6 weeks to revisit the discussion.  She was given information to the online support group.  She does not think that she wants to do it online, although was very much wanting a support group.  As far as I know, all of the support groups for this are online (and support groups for this diagnosis are incredibly limited).  Likewise, she admitted that she really would like some counseling.  Most of the counseling in our area is also online.  We had a Education officer, museum starting in our office again within the next month, but I am not sure that she will be credentialed that fast with the patient's insurance company.  If not, we will try to find the patient some local in person resources.  She denied suicidal ideation today.  2.  Chronic  back pain  -Would really like to see the patient off of opioid medications, primarily because these also increase her risk for falls and confusion.   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  My previous records were reviewed prior to todays visit as well as outside records available to me.  She was started on low-dose levodopa last visit.  She called not long thereafter stating that she was nauseated.  I told her we could change to the extended release, but ultimately she stayed on the immediate release.  She just stopped the medication. Unfortunately, she ended up with yet another fall that resulted in right femoral neck fracture.  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  She was hypotensive following the surgery.  She was discharged to a skilled nursing facility.   She was back in the emergency room 2 days ago for a fall after hitting her head on the concrete ground.  CT brain was unremarkable.  Patient did have MRI brain done since last visit as well.  This was nonacute.  I personally reviewed it.  There was mild small vessel disease.   PREVIOUS MEDICATIONS: levodopa (nausea, ?diarrhea but has chronic diarrhea)  ALLERGIES:   Allergies  Allergen Reactions  . Opana [Oxymorphone Hcl] Other (See Comments)    hallucinations  . Penicillins Hives  Has patient had a PCN reaction causing immediate rash, facial/tongue/throat swelling, SOB or lightheadedness with hypotension: Unknown Has patient had a PCN reaction causing severe rash involving mucus membranes or skin necrosis: No Has patient had a PCN reaction that required hospitalization No Has patient had a PCN reaction occurring within the last 10 years: No If all of the above answers are "NO", then may proceed with Cephalosporin use.     CURRENT MEDICATIONS:  Outpatient Encounter Medications as of 10/04/2020  Medication Sig  . calcium carbonate (OS-CAL) 600 MG TABS tablet  Take 1 tablet by mouth daily.  . clonazePAM (KLONOPIN) 1 MG tablet Take 0.5 mg by mouth at bedtime as needed for anxiety.   . docusate sodium (COLACE) 100 MG capsule Take 1 capsule (100 mg total) by mouth 2 (two) times daily. (Patient taking differently: Take 100 mg by mouth daily.)  . FLUoxetine (PROZAC) 40 MG capsule Take 80 mg by mouth daily.  . Magnesium 500 MG TABS Take 500 mg by mouth daily.  . mometasone (NASONEX) 50 MCG/ACT nasal spray Place 2 sprays into the nose daily.  . Multiple Vitamin (MULTIVITAMIN WITH MINERALS) TABS tablet Take 1 tablet by mouth daily.  Marland Kitchen omeprazole (PRILOSEC) 20 MG capsule Take 20 mg by mouth daily.  Marland Kitchen oxyCODONE 10 MG TABS Take 1 tablet (10 mg total) by mouth every 6 (six) hours as needed for moderate pain. (Patient taking differently: Take 10 mg by mouth 5 (five) times daily.)  . topiramate (TOPAMAX) 100 MG tablet Take 1 tablet (100 mg total) by mouth at bedtime.  . traZODone (DESYREL) 100 MG tablet Take 200 mg by mouth at bedtime.  . vitamin B-12 (CYANOCOBALAMIN) 1000 MCG tablet Take 1 tablet (1,000 mcg total) by mouth daily.  . [DISCONTINUED] carbidopa-levodopa (SINEMET IR) 25-100 MG tablet TAKE 1 TABLET BY MOUTH 3 TIMES A DAY 7AM/11AM/4PM (Patient not taking: No sig reported)  . [DISCONTINUED] feeding supplement, ENSURE ENLIVE, (ENSURE ENLIVE) LIQD Take 237 mLs by mouth 3 (three) times daily between meals. (Patient not taking: Reported on 10/04/2020)  . [DISCONTINUED] gabapentin (NEURONTIN) 100 MG capsule Take 200 mg by mouth 3 (three) times daily. (Patient not taking: Reported on 10/04/2020)  . [DISCONTINUED] ondansetron (ZOFRAN) 8 MG tablet Take 1 tablet (8 mg total) by mouth every 8 (eight) hours as needed for nausea or vomiting. (Patient not taking: Reported on 10/04/2020)  . [DISCONTINUED] polyethylene glycol (MIRALAX) 17 g packet Take 17 g by mouth daily as needed. (Patient not taking: Reported on 10/04/2020)  . [DISCONTINUED] trazodone (DESYREL) 300 MG tablet  Take 1 tablet (300 mg total) by mouth at bedtime as needed for sleep. (Patient not taking: Reported on 10/04/2020)   No facility-administered encounter medications on file as of 10/04/2020.    Objective:   PHYSICAL EXAMINATION:    VITALS:   Vitals:   10/04/20 0823  BP: 110/60  Pulse: 98  SpO2: 98%  Weight: 105 lb (47.6 kg)  Height: 5\' 5"  (1.651 m)    GEN:  The patient appears stated age and is in NAD. HEENT:  Normocephalic, atraumatic.  The mucous membranes are moist.   Neurological examination:  Orientation: The patient is alert and oriented x3. Cranial nerves: There is good facial symmetry with facial hypomimia.  She has esotropia.  The speech is fluent and clear. Soft palate rises symmetrically and there is no tongue deviation. Hearing is intact to conversational tone. Sensation: Sensation is intact to light touch throughout Motor: Strength is at least antigravity x4.  Movement examination: Tone: There is normal tone in the RUE/RLE.  Unable to test in the LUE due to wearing sling.  Tone increased in the LLE, mild Abnormal movements: rare tremor at rest in the L thumb that doesn't increase with distraction Coordination:  There is no decremation Gait and Station: The patient has no difficulty arising out of a deep-seated chair without the use of the hands. The patient's stride length is wide based, short stepped, ataxic.   I have reviewed and interpreted the following labs independently    Chemistry      Component Value Date/Time   NA 136 10/02/2020 1400   NA 147 04/13/2017 0000   K 3.9 10/02/2020 1400   CL 106 10/02/2020 1400   CO2 21 (L) 10/02/2020 1400   BUN 13 10/02/2020 1400   BUN 11 04/13/2017 0000   CREATININE 0.70 10/02/2020 1400   CREATININE 0.95 02/15/2016 1536   GLU 93 04/13/2017 0000      Component Value Date/Time   CALCIUM 9.2 10/02/2020 1400   CALCIUM 9.4 01/13/2007 2356   ALKPHOS 115 10/02/2020 1400   AST 13 (L) 10/02/2020 1400   ALT 11 10/02/2020  1400   BILITOT 0.6 10/02/2020 1400       Lab Results  Component Value Date   WBC 7.2 10/02/2020   HGB 10.5 (L) 10/02/2020   HCT 35.4 (L) 10/02/2020   MCV 87.0 10/02/2020   PLT 253 10/02/2020    Lab Results  Component Value Date   TSH 0.720 12/17/2017     Total time spent on today's visit was 45 minutes, including both face-to-face time and nonface-to-face time.  Time included that spent on review of records (prior notes available to me/labs/imaging if pertinent), discussing treatment and goals, answering patient's questions and coordinating care.  Cc:  Maurice Small, MD

## 2020-10-02 NOTE — ED Notes (Signed)
Patient transported to CT 

## 2020-10-04 ENCOUNTER — Encounter: Payer: Self-pay | Admitting: Neurology

## 2020-10-04 ENCOUNTER — Ambulatory Visit (INDEPENDENT_AMBULATORY_CARE_PROVIDER_SITE_OTHER): Payer: Medicare Other | Admitting: Neurology

## 2020-10-04 ENCOUNTER — Other Ambulatory Visit: Payer: Self-pay

## 2020-10-04 VITALS — BP 110/60 | HR 98 | Ht 65.0 in | Wt 105.0 lb

## 2020-10-04 DIAGNOSIS — G903 Multi-system degeneration of the autonomic nervous system: Secondary | ICD-10-CM | POA: Diagnosis not present

## 2020-10-04 DIAGNOSIS — G894 Chronic pain syndrome: Secondary | ICD-10-CM | POA: Diagnosis not present

## 2020-10-04 DIAGNOSIS — G238 Other specified degenerative diseases of basal ganglia: Secondary | ICD-10-CM | POA: Diagnosis not present

## 2020-10-04 DIAGNOSIS — N183 Chronic kidney disease, stage 3 unspecified: Secondary | ICD-10-CM | POA: Diagnosis not present

## 2020-10-04 DIAGNOSIS — R296 Repeated falls: Secondary | ICD-10-CM | POA: Diagnosis not present

## 2020-10-04 DIAGNOSIS — S72041D Displaced fracture of base of neck of right femur, subsequent encounter for closed fracture with routine healing: Secondary | ICD-10-CM | POA: Diagnosis not present

## 2020-10-04 DIAGNOSIS — I10 Essential (primary) hypertension: Secondary | ICD-10-CM | POA: Diagnosis not present

## 2020-10-04 DIAGNOSIS — I2510 Atherosclerotic heart disease of native coronary artery without angina pectoris: Secondary | ICD-10-CM | POA: Diagnosis not present

## 2020-10-04 NOTE — Patient Instructions (Signed)
Your physician recommends that you schedule a follow-up appointment on: November 05, 2020 at 2:30pm.

## 2020-10-09 DIAGNOSIS — S72041D Displaced fracture of base of neck of right femur, subsequent encounter for closed fracture with routine healing: Secondary | ICD-10-CM | POA: Diagnosis not present

## 2020-10-09 DIAGNOSIS — R296 Repeated falls: Secondary | ICD-10-CM | POA: Diagnosis not present

## 2020-10-09 DIAGNOSIS — N183 Chronic kidney disease, stage 3 unspecified: Secondary | ICD-10-CM | POA: Diagnosis not present

## 2020-10-09 DIAGNOSIS — I2510 Atherosclerotic heart disease of native coronary artery without angina pectoris: Secondary | ICD-10-CM | POA: Diagnosis not present

## 2020-10-09 DIAGNOSIS — G894 Chronic pain syndrome: Secondary | ICD-10-CM | POA: Diagnosis not present

## 2020-10-09 DIAGNOSIS — I10 Essential (primary) hypertension: Secondary | ICD-10-CM | POA: Diagnosis not present

## 2020-10-09 DIAGNOSIS — G903 Multi-system degeneration of the autonomic nervous system: Secondary | ICD-10-CM | POA: Diagnosis not present

## 2020-10-10 ENCOUNTER — Telehealth: Payer: Self-pay | Admitting: Neurology

## 2020-10-10 ENCOUNTER — Other Ambulatory Visit: Payer: Self-pay

## 2020-10-10 NOTE — Telephone Encounter (Signed)
Advised to follow up with PCP

## 2020-10-10 NOTE — Telephone Encounter (Signed)
Patient called in stating she would like to see if Dr. Carles Collet would send in a prescription for clonazepam. She says she is having "the shakes".

## 2020-10-10 NOTE — Telephone Encounter (Signed)
Please see pended Rx

## 2020-10-11 ENCOUNTER — Other Ambulatory Visit: Payer: Medicare Other

## 2020-10-11 NOTE — Telephone Encounter (Signed)
I don't believe I prescribe this medication to the patient.  Did you see otherwise?  She is on chronic opioids and [place] laws would say that she would need to get another controlled substance from same provider.

## 2020-10-15 ENCOUNTER — Other Ambulatory Visit: Payer: Medicare Other

## 2020-10-24 DIAGNOSIS — I10 Essential (primary) hypertension: Secondary | ICD-10-CM | POA: Diagnosis not present

## 2020-10-24 DIAGNOSIS — R296 Repeated falls: Secondary | ICD-10-CM | POA: Diagnosis not present

## 2020-10-24 DIAGNOSIS — S72041D Displaced fracture of base of neck of right femur, subsequent encounter for closed fracture with routine healing: Secondary | ICD-10-CM | POA: Diagnosis not present

## 2020-10-24 DIAGNOSIS — N183 Chronic kidney disease, stage 3 unspecified: Secondary | ICD-10-CM | POA: Diagnosis not present

## 2020-10-24 DIAGNOSIS — I2510 Atherosclerotic heart disease of native coronary artery without angina pectoris: Secondary | ICD-10-CM | POA: Diagnosis not present

## 2020-10-24 DIAGNOSIS — G894 Chronic pain syndrome: Secondary | ICD-10-CM | POA: Diagnosis not present

## 2020-10-24 DIAGNOSIS — G903 Multi-system degeneration of the autonomic nervous system: Secondary | ICD-10-CM | POA: Diagnosis not present

## 2020-10-30 DIAGNOSIS — G894 Chronic pain syndrome: Secondary | ICD-10-CM | POA: Diagnosis not present

## 2020-10-30 DIAGNOSIS — Z79891 Long term (current) use of opiate analgesic: Secondary | ICD-10-CM | POA: Diagnosis not present

## 2020-10-30 DIAGNOSIS — M6281 Muscle weakness (generalized): Secondary | ICD-10-CM | POA: Diagnosis not present

## 2020-10-31 ENCOUNTER — Encounter (HOSPITAL_COMMUNITY): Payer: Self-pay | Admitting: Emergency Medicine

## 2020-10-31 ENCOUNTER — Emergency Department (HOSPITAL_COMMUNITY): Payer: Medicare Other

## 2020-10-31 ENCOUNTER — Emergency Department (HOSPITAL_COMMUNITY)
Admission: EM | Admit: 2020-10-31 | Discharge: 2020-11-01 | Disposition: A | Discharge status: Home / Self Care | Payer: Medicare Other | Attending: Emergency Medicine | Admitting: Emergency Medicine

## 2020-10-31 ENCOUNTER — Other Ambulatory Visit: Payer: Self-pay

## 2020-10-31 DIAGNOSIS — G903 Multi-system degeneration of the autonomic nervous system: Secondary | ICD-10-CM | POA: Diagnosis not present

## 2020-10-31 DIAGNOSIS — G9389 Other specified disorders of brain: Secondary | ICD-10-CM | POA: Diagnosis not present

## 2020-10-31 DIAGNOSIS — I2510 Atherosclerotic heart disease of native coronary artery without angina pectoris: Secondary | ICD-10-CM | POA: Diagnosis not present

## 2020-10-31 DIAGNOSIS — R296 Repeated falls: Secondary | ICD-10-CM | POA: Diagnosis not present

## 2020-10-31 DIAGNOSIS — F17210 Nicotine dependence, cigarettes, uncomplicated: Secondary | ICD-10-CM | POA: Insufficient documentation

## 2020-10-31 DIAGNOSIS — Z8673 Personal history of transient ischemic attack (TIA), and cerebral infarction without residual deficits: Secondary | ICD-10-CM | POA: Diagnosis not present

## 2020-10-31 DIAGNOSIS — R531 Weakness: Secondary | ICD-10-CM | POA: Diagnosis not present

## 2020-10-31 DIAGNOSIS — I10 Essential (primary) hypertension: Secondary | ICD-10-CM | POA: Diagnosis not present

## 2020-10-31 DIAGNOSIS — N183 Chronic kidney disease, stage 3 unspecified: Secondary | ICD-10-CM | POA: Diagnosis not present

## 2020-10-31 DIAGNOSIS — F32A Depression, unspecified: Secondary | ICD-10-CM | POA: Diagnosis not present

## 2020-10-31 DIAGNOSIS — S72041D Displaced fracture of base of neck of right femur, subsequent encounter for closed fracture with routine healing: Secondary | ICD-10-CM | POA: Diagnosis not present

## 2020-10-31 DIAGNOSIS — R109 Unspecified abdominal pain: Secondary | ICD-10-CM | POA: Insufficient documentation

## 2020-10-31 DIAGNOSIS — Z85828 Personal history of other malignant neoplasm of skin: Secondary | ICD-10-CM | POA: Diagnosis not present

## 2020-10-31 DIAGNOSIS — Z96641 Presence of right artificial hip joint: Secondary | ICD-10-CM | POA: Insufficient documentation

## 2020-10-31 DIAGNOSIS — G894 Chronic pain syndrome: Secondary | ICD-10-CM | POA: Diagnosis not present

## 2020-10-31 DIAGNOSIS — J321 Chronic frontal sinusitis: Secondary | ICD-10-CM | POA: Diagnosis not present

## 2020-10-31 LAB — CBC WITH DIFFERENTIAL/PLATELET
Abs Immature Granulocytes: 0 10*3/uL (ref 0.00–0.07)
Basophils Absolute: 0 10*3/uL (ref 0.0–0.1)
Basophils Relative: 1 %
Eosinophils Absolute: 0.1 10*3/uL (ref 0.0–0.5)
Eosinophils Relative: 2 %
HCT: 38.2 % (ref 36.0–46.0)
Hemoglobin: 11.1 g/dL — ABNORMAL LOW (ref 12.0–15.0)
Immature Granulocytes: 0 %
Lymphocytes Relative: 45 %
Lymphs Abs: 2 10*3/uL (ref 0.7–4.0)
MCH: 24.2 pg — ABNORMAL LOW (ref 26.0–34.0)
MCHC: 29.1 g/dL — ABNORMAL LOW (ref 30.0–36.0)
MCV: 83.2 fL (ref 80.0–100.0)
Monocytes Absolute: 0.4 10*3/uL (ref 0.1–1.0)
Monocytes Relative: 10 %
Neutro Abs: 1.9 10*3/uL (ref 1.7–7.7)
Neutrophils Relative %: 42 %
Platelets: 291 10*3/uL (ref 150–400)
RBC: 4.59 MIL/uL (ref 3.87–5.11)
RDW: 15.7 % — ABNORMAL HIGH (ref 11.5–15.5)
WBC: 4.4 10*3/uL (ref 4.0–10.5)
nRBC: 0 % (ref 0.0–0.2)

## 2020-10-31 LAB — COMPREHENSIVE METABOLIC PANEL
ALT: 12 U/L (ref 0–44)
AST: 16 U/L (ref 15–41)
Albumin: 3.8 g/dL (ref 3.5–5.0)
Alkaline Phosphatase: 117 U/L (ref 38–126)
Anion gap: 8 (ref 5–15)
BUN: 14 mg/dL (ref 8–23)
CO2: 21 mmol/L — ABNORMAL LOW (ref 22–32)
Calcium: 9.2 mg/dL (ref 8.9–10.3)
Chloride: 113 mmol/L — ABNORMAL HIGH (ref 98–111)
Creatinine, Ser: 0.72 mg/dL (ref 0.44–1.00)
GFR, Estimated: >60 mL/min (ref >60)
Glucose, Bld: 98 mg/dL (ref 70–99)
Potassium: 3.9 mmol/L (ref 3.5–5.1)
Sodium: 142 mmol/L (ref 135–145)
Total Bilirubin: 0.7 mg/dL (ref 0.3–1.2)
Total Protein: 6.7 g/dL (ref 6.5–8.1)

## 2020-10-31 LAB — URINALYSIS, ROUTINE W REFLEX MICROSCOPIC
Bilirubin Urine: NEGATIVE
Glucose, UA: NEGATIVE mg/dL
Hgb urine dipstick: NEGATIVE
Ketones, ur: NEGATIVE mg/dL
Leukocytes,Ua: NEGATIVE
Nitrite: NEGATIVE
Protein, ur: NEGATIVE mg/dL
Specific Gravity, Urine: 1.012 (ref 1.005–1.030)
pH: 7 (ref 5.0–8.0)

## 2020-10-31 LAB — TROPONIN I (HIGH SENSITIVITY): Troponin I (High Sensitivity): 2 ng/L (ref <18)

## 2020-10-31 NOTE — ED Triage Notes (Signed)
Patient lives alone in her home and is seen once weekly via Physical Therapy.  The patient reports that her PT insisted on calling EMS to be evaluated due to her current co having weakness,decreased appetite, chronic pain issues, depression and having no desire to take care of herself.  The patient reports that she takes medication for depression. Patient denies any intentions of harming herself

## 2020-10-31 NOTE — ED Notes (Signed)
Patient ambulated to restroom.

## 2020-10-31 NOTE — ED Triage Notes (Signed)
Emergency Medicine Provider Triage Evaluation Note  Dorothy White , a 70 y.o. female  was evaluated in triage.  Pt complains of weakness, onset today.  Review of Systems  Positive: weakness Negative: Abdominal pain, chest pain, shortness of breath  Physical Exam  BP 101/67 (BP Location: Right Arm)   Pulse (!) 56   Temp 97.8 F (36.6 C) (Oral)   Resp 16   Ht 5\' 5"  (1.651 m)   Wt 49.9 kg   SpO2 97%   BMI 18.30 kg/m  Gen:   Awake, no distress HEENT:  Atraumatic  Resp:  Normal effort  Cardiac:  Normal rate  Abd:   Nondistended, nontender  MSK:   Moves extremities without difficulty  Neuro:  Speech clear  Medical Decision Making  Medically screening exam initiated at 6:05 PM.  Appropriate orders placed.  Dorothy White was informed that the remainder of the evaluation will be completed by another provider, this initial triage assessment does not replace that evaluation, and the importance of remaining in the ED until their evaluation is complete.  Clinical Impression  weakness   Etta Quill, NP 10/31/20 2230

## 2020-10-31 NOTE — ED Provider Notes (Signed)
[facility] DEPT Provider Note   CSN: [id number] Arrival date & time: 10/31/20  1731     History Chief Complaint  Patient presents with  . Weakness    Dorothy White is a 70 y.o. female.  HPI      Dorothy White is a 70 y.o. female, with a history of anxiety, anemia, depression, hyperlipidemia,, presenting to the ED with generalized weakness.  She states she has felt this way before. She mentioned her generalized weakness to her physical therapist who comes to her house for their therapy sessions.  The physical therapist then called EMS. Patient states she does not currently feel any weaker than normal.  She does not want to be here.  She states she will have episodes of weakness that will then pass.  She denies any SI/HI.  She denies fever/chills, recent illness, focal weakness, numbness, chest pain, shortness of breath, abdominal pain, N/V/D, dizziness, syncope, falls, or any other complaints.  Past Medical History:  Diagnosis Date  . Anemia    ?  Marland Kitchen Anxiety   . Arthritis    "everywhere" (04/25/2016)  . 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 Chronic back pain    "all over" (04/25/2016)  . Constipation   . Depression   . Diverticulitis   . GERD (gastroesophageal reflux disease)   . Head injury 2019   subdural hematoma  . Headache    "weekly" (04/25/2016)  . 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 History of blood transfusion   . HLD (hyperlipidemia)    hx (04/25/2016)  . 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 Mild CAD    a. 25% mLAD, otherwise no sig disease 01/2016 cath.  Marland Kitchen NICM (nonischemic cardiomyopathy) ([place])    a. EF 40-45% by echo 12/735 at time of complicated migraine/neuro sx, 55-65% at time of cath 01/2016  . Stroke [facility]) 06/2013   "mini" stroke , ?possibly hemaplegic migraine per pt    Patient Active Problem List   Diagnosis Date Noted  . Femur fracture, right  ([place]) 08/09/2020  . Closed comminuted fracture of proximal ulna, left, initial encounter 04/04/2020  . Protein-calorie malnutrition, severe 07/04/2018  . UTI (urinary tract infection) 07/03/2018  . Nasal bone fractures 07/03/2018  . Knee pain 07/03/2018  . Normal anion gap metabolic acidosis 10/62/6948  . Subdural hematoma ([place]) 07/02/2018  . Recurrent falls 04/16/2017  . 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 At risk for adverse drug event 04/07/2017  . Acute blood loss as cause of postoperative anemia 04/04/2017  . Acute delirium 04/04/2017  . Osteoporosis 04/02/2017  . Closed fracture of neck of left femur ([place]) 03/31/2017  . Scalp contusion 03/31/2017  . Fall at home, initial encounter 03/31/2017  . Hip fracture ([place]) 03/31/2017  . 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 Hx of transient ischemic attack (TIA) 02/19/2017  . Left carotid stenosis- 40-59% 02/19/2017  . Protein calorie malnutrition ([place]) 02/18/2017  . Falls 02/17/2017  . Chest pain 09/26/2016  . Diverticulitis of sigmoid colon 04/25/2016  . Infection due to ESBL-producing Escherichia coli/Diverticular Abscess 03/15/2016  . Diverticulitis 03/12/2016  . Chronic pain syndrome 03/12/2016  . Lesion of pancreas 03/12/2016  . History of chest pain 03/12/2016  . Hypokalemia 03/12/2016  . Angina, class IV ([place]) - chest tightness and pressure with dyspnea with minimal exertion 02/17/2016    Class: Question of  . TIA (transient ischemic attack) 12/26/2015  .  DOE (dyspnea on exertion) 12/26/2015  . Right sided weakness 12/17/2015  . Ataxia 12/17/2015  . Shoulder fracture 02/15/2015  . Chronic headache 10/11/2013  . Weakness generalized 08/12/2013  . Dizziness and giddiness 08/12/2013  . Abnormality of gait 07/11/2013  . Hyperlipidemia 05/07/2007  . Migraine 05/07/2007  . PREMATURE VENTRICULAR CONTRACTIONS, FREQUENT 05/07/2007  . GERD (gastroesophageal reflux disease) 05/07/2007  . DIVERTICULOSIS, COLON  05/07/2007  . 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 OSTEOPENIA 05/07/2007  . Depression 03/24/2007  . HEMORRHOIDS 03/24/2007  . ALLERGIC RHINITIS 03/24/2007  . LOW BACK PAIN 03/24/2007  . MIGRAINES, HX OF 03/24/2007    Past Surgical History:  Procedure Laterality Date  . ANTERIOR APPROACH HEMI HIP ARTHROPLASTY Left 04/01/2017   Procedure: LEFT DIRECT ANTERIOR TOTAL HIP REPLACEMENT;  Surgeon: Leandrew Koyanagi, MD;  Location: [facility];  Service: Orthopedics;  Laterality: Left;  LEFT DIRECT ANTERIOR TOTAL HIP REPLACEMENT  . AUGMENTATION MAMMAPLASTY  1980  . BASAL CELL CARCINOMA EXCISION     "tip of my nose"  . BUNIONECTOMY Bilateral 10/2003  . CARDIAC CATHETERIZATION N/A 02/20/2016   Procedure: Left Heart Cath and Coronary Angiography;  Surgeon: Jettie Booze, MD;  Location: [facility];  Service: Cardiovascular;  Laterality: N/A;  . COLONOSCOPY    . DILATION AND CURETTAGE OF UTERUS    . FRACTURE SURGERY    . IR GENERIC HISTORICAL  03/18/2016   IR SINUS/FIST TUBE CHK-NON GI 03/18/2016 Aletta Edouard, MD [facility]  . LAPAROSCOPIC SIGMOID COLECTOMY N/A 04/25/2016   Procedure: LAPAROSCOPIC SIGMOID COLECTOMY;  Surgeon: Stark Klein, MD;  Location: [place];  Service: General;  Laterality: N/A;  . OOPHORECTOMY Bilateral ~ 1999  . ORIF HUMERUS FRACTURE Right 02/15/2015   Procedure: OPEN REDUCTION INTERNAL FIXATION (ORIF) PROXIMAL HUMERUS FRACTURE;  Surgeon: Netta Cedars, MD;  Location: [place];  Service: Orthopedics;  Laterality: Right;  . ORIF HUMERUS FRACTURE Left 04/04/2020  . ORIF HUMERUS FRACTURE Left 04/04/2020   Procedure: OPEN REDUCTION INTERNAL FIXATION (ORIF) PROXIMAL HUMERUS FRACTURE;  Surgeon: Iran Planas, MD;  Location: [place];  Service: Orthopedics;  Laterality: Left;  regional block  . ORIF ULNAR FRACTURE Left 04/04/2020  . ORIF ULNAR FRACTURE Left 04/04/2020   Procedure: OPEN REDUCTION INTERNAL FIXATION (ORIF) ULNAR FRACTURE;  Surgeon: Iran Planas, MD;  Location: [place];   Service: Orthopedics;  Laterality: Left;  with regional block  . RHINOPLASTY  1976  . TONSILLECTOMY    . TOTAL HIP ARTHROPLASTY Right 08/11/2020   Procedure: TOTAL HIP ARTHROPLASTY ANTERIOR APPROACH;  Surgeon: Rod Can, MD;  Location: [facility];  Service: Orthopedics;  Laterality: Right;  . TUBAL LIGATION  ~ 1983  . VAGINAL HYSTERECTOMY  ~ 1998     OB History    Gravida  0   Para  0   Term  0   Preterm  0   AB  0   Living  0     SAB  0   IAB  0   Ectopic  0   Multiple  0   Live Births  0           Family History  Problem Relation Age of Onset  . Lung cancer Mother 59  . Migraines Mother   . Heart attack Father        Vague  . Hypertension Brother   . Esophageal cancer Brother   . Diabetes Paternal Grandmother        questionable  . Colon cancer Neg Hx   .  Kidney disease Neg Hx   . Liver disease Neg Hx     Social History   Tobacco Use  . Smoking status: Current Some Day Smoker    Packs/day: 0.25    Years: 34.00    Pack years: 8.50    Types: Cigarettes  . Smokeless tobacco: Never Used  Vaping Use  . Vaping Use: Never used  Substance Use Topics  . Alcohol use: Not Currently    Alcohol/week: 1.0 standard drink    Types: 1 Standard drinks or equivalent per week    Comment: occ   . Drug use: No    Types: Cocaine    Comment: Denies any drug use 02/19/18    Home Medications Prior to Admission medications   Medication Sig Start Date End Date Taking? Authorizing Provider  calcium carbonate (OS-CAL) 600 MG TABS tablet Take 1 tablet by mouth daily.    [provider]  clonazePAM (KLONOPIN) 1 MG tablet Take 0.5 mg by mouth at bedtime as needed for anxiety.  03/21/20   [provider]  docusate sodium (COLACE) 100 MG capsule Take 1 capsule (100 mg total) by mouth 2 (two) times daily. Patient taking differently: Take 100 mg by mouth daily. 08/14/20   Shelly Coss, MD  FLUoxetine (PROZAC) 40 MG capsule Take 80 mg by mouth daily.  04/18/18   [provider]  Magnesium 500 MG TABS Take 500 mg by mouth daily.    [provider]  mometasone (NASONEX) 50 MCG/ACT nasal spray Place 2 sprays into the nose daily. 04/22/17   Medina-Vargas, Monina C, NP  Multiple Vitamin (MULTIVITAMIN WITH MINERALS) TABS tablet Take 1 tablet by mouth daily. 07/07/18   Hosie Poisson, MD  omeprazole (PRILOSEC) 20 MG capsule Take 20 mg by mouth daily.    [provider]  oxyCODONE 10 MG TABS Take 1 tablet (10 mg total) by mouth every 6 (six) hours as needed for moderate pain. Patient taking differently: Take 10 mg by mouth 5 (five) times daily. 08/14/20   Swinteck, Aaron Edelman, MD  topiramate (TOPAMAX) 100 MG tablet Take 1 tablet (100 mg total) by mouth at bedtime. 12/28/17   Kathrynn Ducking, MD  traZODone (DESYREL) 100 MG tablet Take 200 mg by mouth at bedtime. 07/16/20   [provider]  vitamin B-12 (CYANOCOBALAMIN) 1000 MCG tablet Take 1 tablet (1,000 mcg total) by mouth daily. 02/19/17   Debbe Odea, MD    Allergies    Opana [oxymorphone hcl] and Penicillins  Review of Systems   Review of Systems  Constitutional: Negative for chills and fever.  Respiratory: Negative for cough and shortness of breath.   Cardiovascular: Negative for chest pain and leg swelling.  Gastrointestinal: Negative for abdominal pain, diarrhea, nausea and vomiting.  Genitourinary: Negative for dysuria and hematuria.  Musculoskeletal: Negative for back pain and neck pain.  Neurological: Positive for weakness. Negative for dizziness, syncope, numbness and headaches.  All other systems reviewed and are negative.   Physical Exam Updated Vital Signs BP 101/67 (BP Location: Right Arm)   Pulse (!) 56   Temp 97.8 F (36.6 C) (Oral)   Resp 16   Ht 5\' 5"  (1.651 m)   Wt 49.9 kg   SpO2 97%   BMI 18.30 kg/m   Physical Exam Vitals and nursing note reviewed.  Constitutional:      General: She is not in acute distress.    Appearance: She is  well-developed. She is not diaphoretic.  HENT:  Head: Normocephalic and atraumatic.     Mouth/Throat:     Mouth: Mucous membranes are moist.     Pharynx: Oropharynx is clear.  Eyes:     Conjunctiva/sclera: Conjunctivae normal.  Cardiovascular:     Rate and Rhythm: Normal rate and regular rhythm.     Pulses: Normal pulses.          Radial pulses are 2+ on the right side and 2+ on the left side.       Posterior tibial pulses are 2+ on the right side and 2+ on the left side.     Heart sounds: Normal heart sounds.     Comments: Tactile temperature in the extremities appropriate and equal bilaterally. Pulmonary:     Effort: Pulmonary effort is normal. No respiratory distress.     Breath sounds: Normal breath sounds.  Abdominal:     Palpations: Abdomen is soft.     Tenderness: There is no abdominal tenderness. There is no guarding.  Musculoskeletal:     Cervical back: Neck supple.     Right lower leg: No edema.     Left lower leg: No edema.  Skin:    General: Skin is warm and dry.  Neurological:     Mental Status: She is alert and oriented to person, place, and time.     Comments: No noted acute cognitive deficit. Sensation grossly intact to light touch in the extremities.   Grip strengths equal bilaterally.   Strength 5/5 in all extremities.  Ambulated to her baseline, which is using a cane due to surgery a few months ago to repair a hip fracture.  Coordination intact.  Cranial nerves III-XII grossly intact.  Handles oral secretions without noted difficulty.  No noted phonation or speech deficit. No facial droop.   Psychiatric:        Mood and Affect: Mood and affect normal.        Speech: Speech normal.        Behavior: Behavior normal.     ED Results / Procedures / Treatments   Labs (all labs ordered are listed, but only abnormal results are displayed) Labs Reviewed  COMPREHENSIVE METABOLIC PANEL - Abnormal; Notable for the following components:      Result Value    Chloride 113 (*)    CO2 21 (*)    All other components within normal limits  CBC WITH DIFFERENTIAL/PLATELET - Abnormal; Notable for the following components:   Hemoglobin 11.1 (*)    MCH 24.2 (*)    MCHC 29.1 (*)    RDW 15.7 (*)    All other components within normal limits  URINALYSIS, ROUTINE W REFLEX MICROSCOPIC - Abnormal; Notable for the following components:   APPearance HAZY (*)    All other components within normal limits  TROPONIN I (HIGH SENSITIVITY)  TROPONIN I (HIGH SENSITIVITY)    EKG EKG Interpretation  Date/Time:  Wednesday October 31 2020 18:11:58 EDT Ventricular Rate:  57 PR Interval:  121 QRS Duration: 86 QT Interval:  452 QTC Calculation: 441 R Axis:   44 Text Interpretation: Sinus rhythm Borderline T abnormalities, anterior leads 12 Lead; [place]-L[place]ar No acute changes No significant change since last tracing Confirmed by Varney Biles ([id number]) on 10/31/2020 9:44:25 PM   Radiology CT Head Wo Contrast  Result Date: 11/01/2020 CLINICAL DATA:  Transient ischemic attack.  Weakness. EXAM: CT HEAD WITHOUT CONTRAST TECHNIQUE: Contiguous axial images were obtained from the base of the skull through the vertex without intravenous contrast. COMPARISON:  CT head 10/02/2020 FINDINGS: Brain: Cerebral ventricle sizes are concordant with the degree of cerebral volume loss. Patchy and confluent areas of decreased attenuation are noted throughout the deep and periventricular white matter of the cerebral hemispheres bilaterally, compatible with chronic microvascular ischemic disease. No evidence of large-territorial acute infarction. No parenchymal hemorrhage. No mass lesion. No extra-axial collection. No mass effect or midline shift. No hydrocephalus. Basilar cisterns are patent. Vascular: No hyperdense vessel. Skull: No acute fracture or focal lesion. Sinuses/Orbits: Left frontal mucosal thickening. Otherwise the paranasal sinuses and mastoid air cells are clear. Bilateral lens  replacement. Otherwise orbits are unremarkable. Other: None. IMPRESSION: No acute intracranial abnormality. Electronically Signed   By: Iven Finn M.D.   On: 11/01/2020 00:00    Procedures Procedures   Medications Ordered in ED Medications - No data to display  ED Course  I have reviewed the triage vital signs and the nursing notes.  Pertinent labs & imaging results that were available during my care of the patient were reviewed by me and considered in my medical decision making (see chart for details).    MDM Rules/Calculators/A&P                          Patient presents with reported weakness. The patient was given instructions for home care as well as return precautions. Patient voices understanding of these instructions, accepts the plan, and is comfortable with discharge.  I reviewed and interpreted the patient's labs and radiological studies. No focal neurologic deficits noted on exam.  Patient ambulated according to her baseline without need for additional assistance.  Findings and plan of care discussed with attending physician, Varney Biles, MD. Dr. Kathrynn Humble personally evaluated and examined this patient.   Final Clinical Impression(s) / ED Diagnoses Final diagnoses:  Weakness    Rx / DC Orders ED Discharge Orders    None       Layla Maw 11/01/20 0102    Varney Biles, MD 11/01/20 1512

## 2020-11-01 DIAGNOSIS — R531 Weakness: Secondary | ICD-10-CM | POA: Diagnosis not present

## 2020-11-01 LAB — TROPONIN I (HIGH SENSITIVITY): Troponin I (High Sensitivity): 2 ng/L (ref <18)

## 2020-11-01 NOTE — Discharge Instructions (Signed)
Return to the emergency department should you begin to experience weakness on one side of body or the other, passing out, chest pain, shortness of breath, or any other major concerns.

## 2020-11-01 NOTE — ED Notes (Signed)
[facility] called to take patient home per request

## 2020-11-02 NOTE — Progress Notes (Deleted)
Assessment/Plan:   1.  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  She has had very serious consequences of falls including fractures and subdural hematoma.  Discussed again with patient that I no longer want her to be able to walk.  I need her in a wheelchair at all times.   discussed power WC. Pt needs power WC as she has difficulty getting to bathroom on time. Pt having trouble navigating getting in and out of shower and power WC would be of great benefit with this task that walker is of no benefit for.  In addition, multiple serious falls with the walker.  Pt has the mental capability to use the power WC.  Need to refer to PT for WC eval.  She cannot use manual WC because of endurance abilities to propel and poor coordination of UE's. she could not mount a scooter/transfer onto safely, hence making it inappropriate for this patient.   Rollator not appropriate as multiple falls with this and traditional walker.    -Patient met with my social worker for short period of time today, but I would like patient to meet with her more regularly for counseling.  -Discussed with patient that we are going to be restarting the atypical parkinsonian support group in person.  I think patient would do well with this.  2.  Chronic back pain  -Would really like to see the patient off of opioid medications, primarily because these also increase her risk for falls and confusion.   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  My previous records were reviewed prior to todays visit as well as outside records available to me.  Last visit, the patient and I discussed power wheelchair in detail, as I told her I really wanted her to stay seated at all times.  The patient became upset and overwhelmed with the conversation and the degenerative nature of her disease.  We decided to bring her back today to revisit the  discussion.  Patient has been in the emergency room again since our last visit.  She was there on April 6.  I have reviewed those records.  She apparently was having generalized weakness and was sent to the emergency room by her physical therapist.  Work-up was negative, including CT brain.   PREVIOUS MEDICATIONS: levodopa (nausea, ?diarrhea but has chronic diarrhea)  ALLERGIES:   Allergies  Allergen Reactions  . Opana [Oxymorphone Hcl] Other (See Comments)    hallucinations  . Penicillins Hives    Has patient had a PCN reaction causing immediate rash, facial/tongue/throat swelling, SOB or lightheadedness with hypotension: Unknown Has patient had a PCN reaction causing severe rash involving mucus membranes or skin necrosis: No Has patient had a PCN reaction that required hospitalization No Has patient had a PCN reaction occurring within the last 10 years: No If all of the above answers are "NO", then may proceed with Cephalosporin use.     CURRENT MEDICATIONS:  Outpatient Encounter Medications as of 11/05/2020  Medication Sig  . calcium carbonate (OS-CAL) 600 MG TABS tablet Take 1 tablet by mouth daily.  . clonazePAM (KLONOPIN) 1 MG tablet Take 0.5 mg by mouth at bedtime as needed for anxiety.   . docusate sodium (COLACE) 100 MG capsule Take 1 capsule (100 mg total) by mouth 2 (two) times daily. (Patient taking differently: Take 100 mg by mouth daily.)  . FLUoxetine (PROZAC) 40 MG capsule Take  80 mg by mouth daily.  . Magnesium 500 MG TABS Take 500 mg by mouth daily.  . mometasone (NASONEX) 50 MCG/ACT nasal spray Place 2 sprays into the nose daily.  . Multiple Vitamin (MULTIVITAMIN WITH MINERALS) TABS tablet Take 1 tablet by mouth daily.  Marland Kitchen omeprazole (PRILOSEC) 20 MG capsule Take 20 mg by mouth daily.  Marland Kitchen oxyCODONE 10 MG TABS Take 1 tablet (10 mg total) by mouth every 6 (six) hours as needed for moderate pain. (Patient taking differently: Take 10 mg by mouth 5 (five) times daily.)  .  topiramate (TOPAMAX) 100 MG tablet Take 1 tablet (100 mg total) by mouth at bedtime.  . traZODone (DESYREL) 100 MG tablet Take 200 mg by mouth at bedtime.  . vitamin B-12 (CYANOCOBALAMIN) 1000 MCG tablet Take 1 tablet (1,000 mcg total) by mouth daily.   No facility-administered encounter medications on file as of 11/05/2020.    Objective:   PHYSICAL EXAMINATION:    VITALS:   There were no vitals filed for this visit.  GEN:  The patient appears stated age and is in NAD. HEENT:  Normocephalic, atraumatic.  The mucous membranes are moist.   Neurological examination:  Orientation: The patient is alert and oriented x3. Cranial nerves: There is good facial symmetry with facial hypomimia.  She has esotropia.  The speech is fluent and clear. Soft palate rises symmetrically and there is no tongue deviation. Hearing is intact to conversational tone. Sensation: Sensation is intact to light touch throughout Motor: Strength is at least antigravity x4.  Movement examination: Tone: There is normal tone in the RUE/RLE.  Unable to test in the LUE due to wearing sling.  Tone increased in the LLE, mild Abnormal movements: rare tremor at rest in the L thumb that doesn't increase with distraction Coordination:  There is no decremation Gait and Station: The patient has no difficulty arising out of a deep-seated chair without the use of the hands. The patient's stride length is wide based, short stepped, ataxic.   I have reviewed and interpreted the following labs independently    Chemistry      Component Value Date/Time   NA 142 10/31/2020 1817   NA 147 04/13/2017 0000   K 3.9 10/31/2020 1817   CL 113 (H) 10/31/2020 1817   CO2 21 (L) 10/31/2020 1817   BUN 14 10/31/2020 1817   BUN 11 04/13/2017 0000   CREATININE 0.72 10/31/2020 1817   CREATININE 0.95 02/15/2016 1536   GLU 93 04/13/2017 0000      Component Value Date/Time   CALCIUM 9.2 10/31/2020 1817   CALCIUM 9.4 01/13/2007 2356   ALKPHOS  117 10/31/2020 1817   AST 16 10/31/2020 1817   ALT 12 10/31/2020 1817   BILITOT 0.7 10/31/2020 1817       Lab Results  Component Value Date   WBC 4.4 10/31/2020   HGB 11.1 (L) 10/31/2020   HCT 38.2 10/31/2020   MCV 83.2 10/31/2020   PLT 291 10/31/2020    Lab Results  Component Value Date   TSH 0.720 12/17/2017     Total time spent on today's visit was 45 minutes, including both face-to-face time and nonface-to-face time.  Time included that spent on review of records (prior notes available to me/labs/imaging if pertinent), discussing treatment and goals, answering patient's questions and coordinating care.  Cc:  Maurice Small, MD

## 2020-11-04 DIAGNOSIS — G903 Multi-system degeneration of the autonomic nervous system: Secondary | ICD-10-CM | POA: Diagnosis not present

## 2020-11-04 DIAGNOSIS — I10 Essential (primary) hypertension: Secondary | ICD-10-CM | POA: Diagnosis not present

## 2020-11-04 DIAGNOSIS — R296 Repeated falls: Secondary | ICD-10-CM | POA: Diagnosis not present

## 2020-11-04 DIAGNOSIS — I2510 Atherosclerotic heart disease of native coronary artery without angina pectoris: Secondary | ICD-10-CM | POA: Diagnosis not present

## 2020-11-04 DIAGNOSIS — S72041D Displaced fracture of base of neck of right femur, subsequent encounter for closed fracture with routine healing: Secondary | ICD-10-CM | POA: Diagnosis not present

## 2020-11-04 DIAGNOSIS — N183 Chronic kidney disease, stage 3 unspecified: Secondary | ICD-10-CM | POA: Diagnosis not present

## 2020-11-04 DIAGNOSIS — G894 Chronic pain syndrome: Secondary | ICD-10-CM | POA: Diagnosis not present

## 2020-11-05 ENCOUNTER — Ambulatory Visit: Payer: Medicare Other | Admitting: Neurology

## 2020-11-05 DIAGNOSIS — S72041D Displaced fracture of base of neck of right femur, subsequent encounter for closed fracture with routine healing: Secondary | ICD-10-CM | POA: Diagnosis not present

## 2020-11-05 DIAGNOSIS — R296 Repeated falls: Secondary | ICD-10-CM | POA: Diagnosis not present

## 2020-11-05 DIAGNOSIS — I10 Essential (primary) hypertension: Secondary | ICD-10-CM | POA: Diagnosis not present

## 2020-11-05 DIAGNOSIS — G903 Multi-system degeneration of the autonomic nervous system: Secondary | ICD-10-CM | POA: Diagnosis not present

## 2020-11-05 DIAGNOSIS — N183 Chronic kidney disease, stage 3 unspecified: Secondary | ICD-10-CM | POA: Diagnosis not present

## 2020-11-05 DIAGNOSIS — I2510 Atherosclerotic heart disease of native coronary artery without angina pectoris: Secondary | ICD-10-CM | POA: Diagnosis not present

## 2020-11-05 DIAGNOSIS — G894 Chronic pain syndrome: Secondary | ICD-10-CM | POA: Diagnosis not present

## 2020-11-06 DIAGNOSIS — M6281 Muscle weakness (generalized): Secondary | ICD-10-CM | POA: Diagnosis not present

## 2020-11-07 DIAGNOSIS — Z09 Encounter for follow-up examination after completed treatment for conditions other than malignant neoplasm: Secondary | ICD-10-CM | POA: Diagnosis not present

## 2020-11-07 DIAGNOSIS — M25512 Pain in left shoulder: Secondary | ICD-10-CM | POA: Diagnosis not present

## 2020-11-07 DIAGNOSIS — E43 Unspecified severe protein-calorie malnutrition: Secondary | ICD-10-CM | POA: Diagnosis not present

## 2020-11-07 DIAGNOSIS — S40022D Contusion of left upper arm, subsequent encounter: Secondary | ICD-10-CM | POA: Diagnosis not present

## 2020-11-07 DIAGNOSIS — G903 Multi-system degeneration of the autonomic nervous system: Secondary | ICD-10-CM | POA: Diagnosis not present

## 2020-11-12 DIAGNOSIS — G894 Chronic pain syndrome: Secondary | ICD-10-CM | POA: Diagnosis not present

## 2020-11-12 DIAGNOSIS — S72041D Displaced fracture of base of neck of right femur, subsequent encounter for closed fracture with routine healing: Secondary | ICD-10-CM | POA: Diagnosis not present

## 2020-11-12 DIAGNOSIS — G903 Multi-system degeneration of the autonomic nervous system: Secondary | ICD-10-CM | POA: Diagnosis not present

## 2020-11-12 DIAGNOSIS — R296 Repeated falls: Secondary | ICD-10-CM | POA: Diagnosis not present

## 2020-11-12 DIAGNOSIS — I2510 Atherosclerotic heart disease of native coronary artery without angina pectoris: Secondary | ICD-10-CM | POA: Diagnosis not present

## 2020-11-12 DIAGNOSIS — N183 Chronic kidney disease, stage 3 unspecified: Secondary | ICD-10-CM | POA: Diagnosis not present

## 2020-11-12 DIAGNOSIS — I10 Essential (primary) hypertension: Secondary | ICD-10-CM | POA: Diagnosis not present

## 2020-11-16 ENCOUNTER — Telehealth: Payer: Self-pay | Admitting: Neurology

## 2020-11-16 NOTE — Telephone Encounter (Signed)
Ria Comment from [facility] returned our call

## 2020-11-16 NOTE — Telephone Encounter (Signed)
Ria Comment from [facility] called wanting to see if this patient is actually a good fit for hospice.

## 2020-11-16 NOTE — Telephone Encounter (Signed)
LMOVM to call back 

## 2020-11-16 NOTE — Telephone Encounter (Signed)
Pt did not show to her visit with me a few weeks ago so difficult for me to say.  If she is from hospice, isn't it her job to evaluate that.  The patients dx is certainly one that will ultimately be terminal but where she is in that stage is not quite known since she didn't come to that visit.  In addition, i'm not sure that pt ready to address that (she certainly wasn't when I last saw her and that wasn't long ago)

## 2020-11-19 NOTE — Telephone Encounter (Signed)
Spoke with the hospice nurse Ria Comment who states the patient revoked the hospice referral and is requesting pallative care. She states the patient lives alone and is still driving. She states the patients pcp Dr Maurice Small has placed a new referral.

## 2020-11-20 DIAGNOSIS — M7061 Trochanteric bursitis, right hip: Secondary | ICD-10-CM | POA: Diagnosis not present

## 2020-11-20 DIAGNOSIS — S72031D Displaced midcervical fracture of right femur, subsequent encounter for closed fracture with routine healing: Secondary | ICD-10-CM | POA: Diagnosis not present

## 2020-11-22 ENCOUNTER — Telehealth: Payer: Self-pay

## 2020-11-22 DIAGNOSIS — S42222D 2-part displaced fracture of surgical neck of left humerus, subsequent encounter for fracture with routine healing: Secondary | ICD-10-CM | POA: Diagnosis not present

## 2020-11-22 NOTE — Telephone Encounter (Signed)
Spoke with patient and scheduled an in-person Palliative Consult for 12/14/20 @ 9AM  COVID screening was negative. She does have dogs upstairs. Patient lives with roommates.  Consent obtained; updated Outlook/Netsmart/Team List and Epic.  Family is aware they may be receiving a call from NP the day before or day of to confirm appointment.

## 2020-11-23 IMAGING — CT CT HEAD WITHOUT CONTRAST
2 series · 15 of 30 positions shown, 17 images · non-contrast
Comparison: Most recent prior 07/03/2018.

CLINICAL DATA: Subdural hematoma without coma. Continued
surveillance.

EXAM:
CT HEAD WITHOUT CONTRAST
TECHNIQUE: Contiguous axial images were obtained from the base of the skull
through the vertex without intravenous contrast.

[Series 2: head w/(date) · axial · 0.45mm/px · z∈[+879,+1019]mm · 7 of 38 slices shown, 9 images]
[im 5/38  brain]
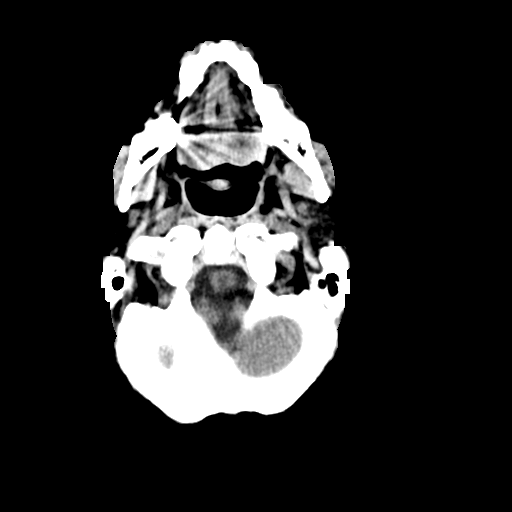
[im 5/38  bone]
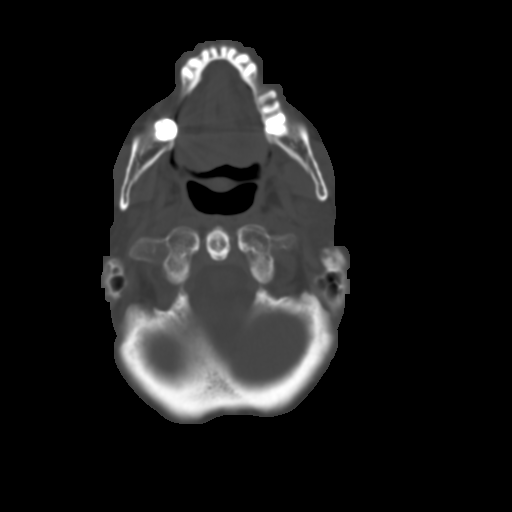
[im 10/38  brain]
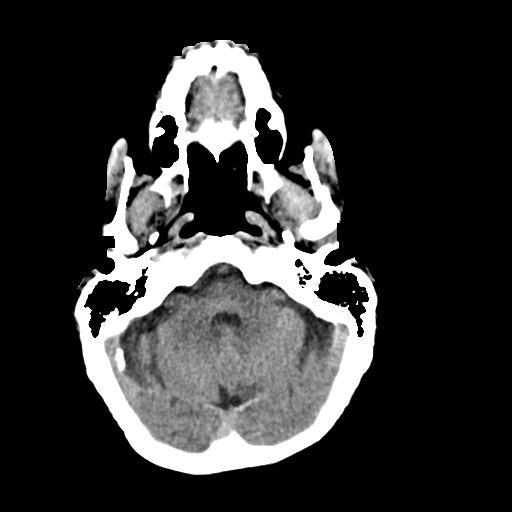
[im 14/38  brain]
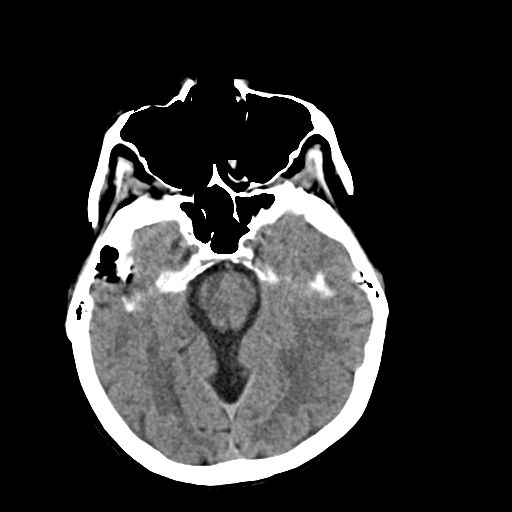
[im 19/38  brain]
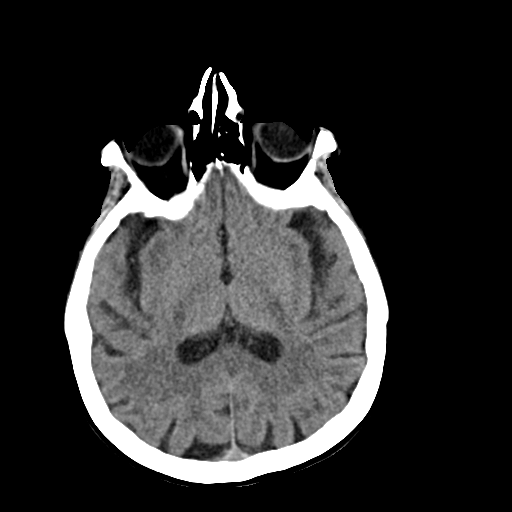
[im 24/38  brain]
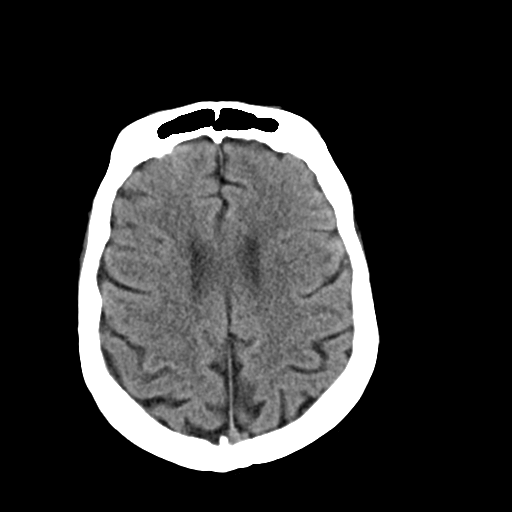
[im 24/38  bone]
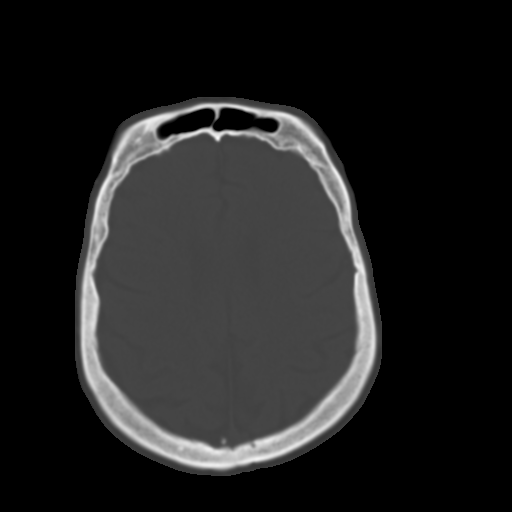
[im 28/38  brain]
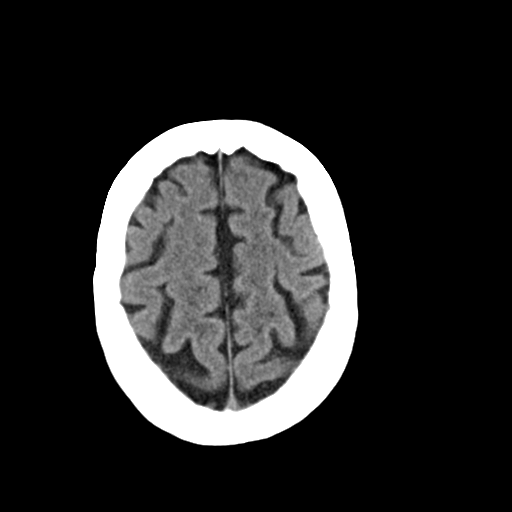
[im 33/38  brain]
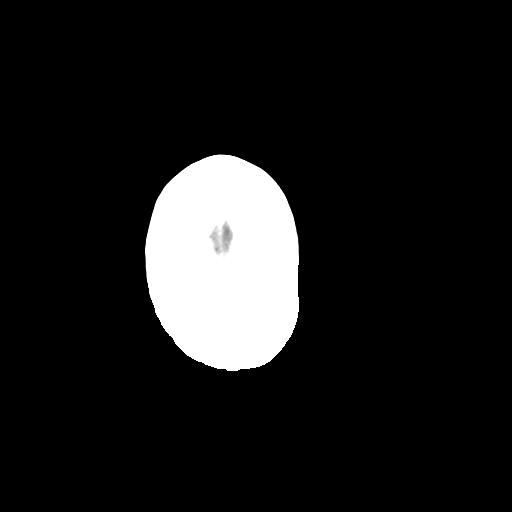

[Series 3: bone · axial · 0.45mm/px · z∈[+877,+1025]mm · 8 of 94 slices shown]
[im 10/94  bone]
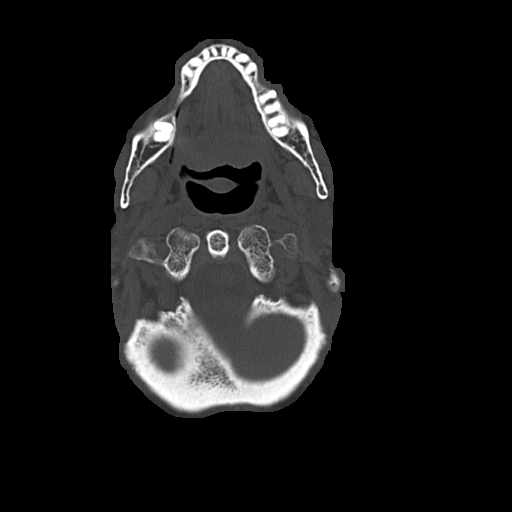
[im 19/94  bone]
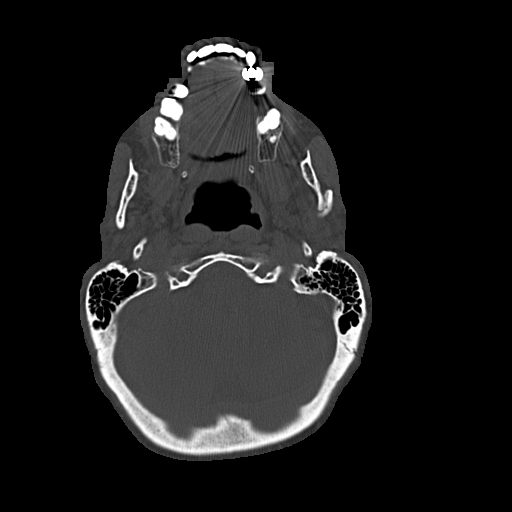
[im 28/94  bone]
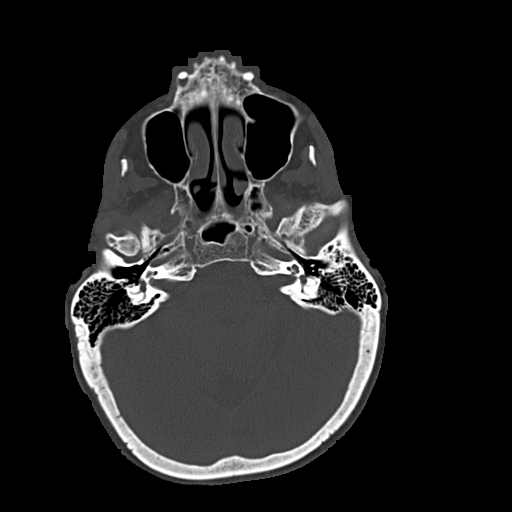
[im 42/94  bone]
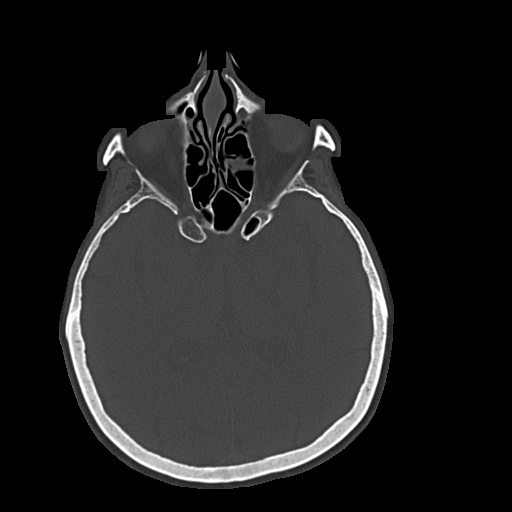
[im 52/94  bone]
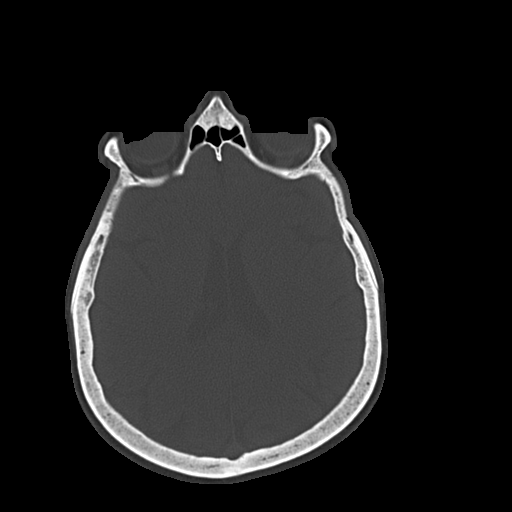
[im 66/94  bone]
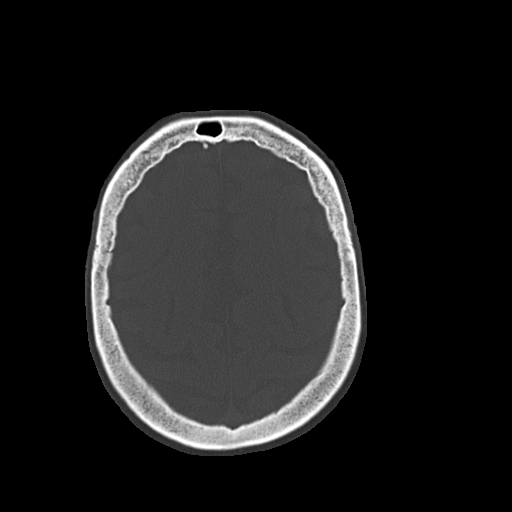
[im 75/94  bone]
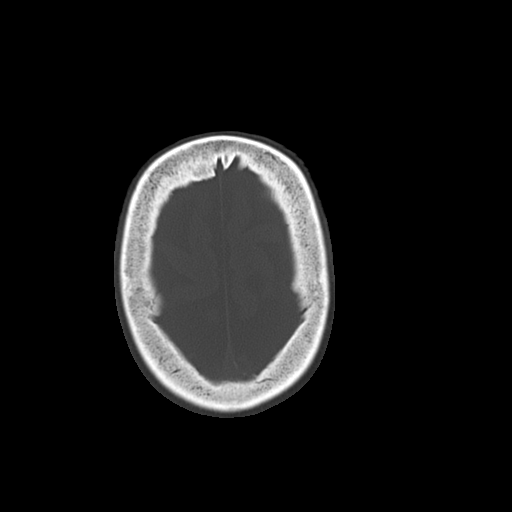
[im 84/94  bone]
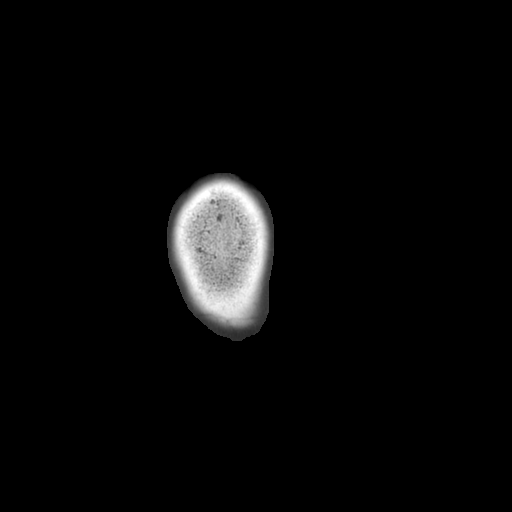

[15 of 30 positions shown; findings below may reference images not displayed]

FINDINGS: Brain: No evidence of acute stroke, acute hemorrhage, mass lesion,
or hydrocephalus. Mild to moderate cerebral and cerebellar atrophy,
advanced for age.

Previous subdural hematoma/hygroma has resolved. On the prior exam,
this measured 6 mm thickness as seen on coronal series 5, image 27.
No similar collection is seen today, and there are no new
abnormalities. In retrospect, a small subdural hematoma or hygroma
was present on the RIGHT, on the previous scan, and this is also
resolved.

Vascular: Calcification of the cavernous internal carotid arteries
consistent with cerebrovascular atherosclerotic disease. No signs of
intracranial large vessel occlusion.

Skull: Calvarium intact. No skull fracture.

Sinuses/Orbits: Paranasal sinuses demonstrate no significant opacity
or layering fluid. Orbits are unremarkable.

Other: None.
IMPRESSION: Resolved BILATERAL LEFT greater than RIGHT subdural
hematomas/hygromas. No new finding.

## 2020-11-30 ENCOUNTER — Ambulatory Visit: Payer: Medicare Other | Admitting: Neurology

## 2020-12-12 ENCOUNTER — Other Ambulatory Visit: Payer: Self-pay | Admitting: Neurology

## 2020-12-12 DIAGNOSIS — T85698A Other mechanical complication of other specified internal prosthetic devices, implants and grafts, initial encounter: Secondary | ICD-10-CM | POA: Diagnosis not present

## 2020-12-12 DIAGNOSIS — S52022A Displaced fracture of olecranon process without intraarticular extension of left ulna, initial encounter for closed fracture: Secondary | ICD-10-CM | POA: Diagnosis not present

## 2020-12-14 ENCOUNTER — Other Ambulatory Visit: Payer: Self-pay

## 2020-12-14 ENCOUNTER — Other Ambulatory Visit: Payer: Medicare Other | Admitting: Nurse Practitioner

## 2020-12-14 VITALS — BP 158/56 | HR 66 | Resp 18 | Ht 65.0 in | Wt 105.0 lb

## 2020-12-14 DIAGNOSIS — K5900 Constipation, unspecified: Secondary | ICD-10-CM | POA: Diagnosis not present

## 2020-12-14 DIAGNOSIS — R296 Repeated falls: Secondary | ICD-10-CM

## 2020-12-14 DIAGNOSIS — J302 Other seasonal allergic rhinitis: Secondary | ICD-10-CM

## 2020-12-14 DIAGNOSIS — E43 Unspecified severe protein-calorie malnutrition: Secondary | ICD-10-CM | POA: Diagnosis not present

## 2020-12-14 DIAGNOSIS — Z515 Encounter for palliative care: Secondary | ICD-10-CM | POA: Diagnosis not present

## 2020-12-14 NOTE — Progress Notes (Incomplete)
Therapist, nutritional Palliative Care Consult Note Telephone: [phone]  Fax: [phone]    Date of encounter: 12/14/20 PATIENT NAME: Dorothy White [address] [place] [address]   [phone] (home)  DOB: 10/18/1950 MRN: [id number]  PRIMARY CARE PROVIDER:    Shirlean Mylar, MD,  [address] [place] [address] [phone]  REFERRING PROVIDER:   Shirlean Mylar, MD [address] [place],  [place] [address] [phone]  RESPONSIBLE PARTY:    Contact Information    Name Relation Home Work [address]   Nash Mantis Friend   [phone]   Krystan, [place] [phone]  [phone]     I met face to face with patient in home. Palliative Care was asked to follow this patient by consultation request of  Shirlean Mylar, MD to address advance care planning and complex medical decision making. This is the initial visit.                                   ASSESSMENT AND PLAN / RECOMMENDATIONS:   Advance Care Planning/Goals of Care: Goals include to maximize quality of life and symptom management.  Visit consisted of building trust and discussions on Palliative care medicine as specialized medical care for people living with serious illness, aimed at facilitating improved quality of life through symptoms relief, assisting with advance care planning and establishing goals of care. Patient expressed appreciation for education provided on Palliative care and how it differs from Hospice service. Our advance care planning conversation included a discussion about:     The value and importance of advance care planning   Experiences with loved ones who have been seriously ill or have died   Exploration of personal, cultural or spiritual beliefs that might influence medical decisions   Exploration of goals of care in the event of a sudden injury or illness   Review and updating or creation of an  advance directive  document .  Decision not to resuscitate or to de-escalate disease focused treatments due to poor prognosis.  CODE STATUS: DNR Goals of care: Patient's goal of care is function.She verbalized desire to regain strength and be able to be independent with her ADLs.  Directives: After discussions on ramifications and implications of code status patient decided to not be resuscitated in the event of cardiac or respiratory arrest. She decided on DNR as her code status. DNR form signed for patient today. Patient advised to keep form in a readily assessable area in her home, so it can be easily seen if needed. She verbalized understanding.  I spent 30 minutes providing this consultation. More than 50% of the time in this consultation was spent in counseling and care coordination. 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 falls from othostatic hypotension. Has balance problems. Was getting PT, was discharged due to staff shortage. PT was through [facility]. Sinus drainage: for months, watery itchy eyes, dry cough, sometimes clear mucus. Sometimes headache, history of Migraine tke Topiramate every night.  Take certirizine every night. weigth loss: 105lbs down from 130lbs. .  belives she hsd IBS, has diarhea or constipation. Constipation, last BM is a week ago, passing gas. No abd pain, no bloating. Usuallty relieved by magnesium 500mg  but recently not working. Miralax every night until you have bowel movement Left arm in  a cast. Oxycodone-acetamniphen 10/325mg  take one tabe by mouth 5 times daily  Follow up Palliative Care Visit: Palliative care will continue to follow for complex medical decision making, advance care planning, and clarification of goals. Return in about 4-6 weeks or prn.  PPS: 60%  HOSPICE  ELIGIBILITY/DIAGNOSIS: TBD, patient declined Hospice services in the past.  Chief Complaint: watery itchy eyes  History obtained from review of EMR, discussion with primary team, and interview with family, facility staff/caregiver and/or Ms. Eberly.  HISTORY OF PRESENT ILLNESS:  Dorothy White is a 70 y.o. year old female  with Multiple systems atrophy (per patient's report), non-obstructive CAD, HLD, depression, anxiety, Migraine headaches (on Topamax).  Patient report being diagnosed with multiple systems atrophy a year ago. She report frequent falls from orthostatic hypotension. Patient with cast on her left arm today secondary to injury sustained from a fall last month. Patient report her falls is related to syncopal episodes due to orthostatic hypotension, report not being aware of some of the falls, as she sometimes wakes up and find herself on the floor. Patient report being discharged from physical therapy thru [facility] when she was considering hospice care. Patient had a Hospice Admission visitbut declined Hospice services. She reiterated desire to not pursue Hospice care at this time saying she want to continue fighting for her life. Patient report having watery and itch eyes with dry cough. She report symptoms started about a week ago and worse when she goes outside her home. She report occasional headaches. She denied fever, denied chills, denied SOB.   I reviewed available labs, medications, imaging, studies and related documents from the EMR.  Records reviewed and summarized above.   ROS EYES: denies vision changes ENMT: denies dysphagia Cardiovascular: denies chest pain, denies DOE Pulmonary: denies cough, denies increased SOB Abdomen: endorses good appetite, denies constipation, endorses continence of bowel GU: denies dysuria, endorses continence of urine MSK:  endorsed weakness,  frequent fallfalls reported Skin: denies rashes or wounds Neurological: denies uncontrolled  pain, denies insomnia Psych: Endorses positive mood Heme/lymph/immuno: denies bruises, abnormal bleeding  Vitals with BMI 12/14/2020 11/01/2020 10/31/2020  Height 5\' 5"  - -  Weight 105 lbs - -  BMI 16.94 - -  Systolic 503 888 280  Diastolic 56 69 67  Pulse 66 52 49   Physical Exam: Weight: 105lbs down from baseline weight of 130l General: chronically ill and frail appearing, thin, NAD  EYES: anicteric sclera, no discharge  ENMT: intact hearing, oral mucous membranes moist CV: S1S2 normal, no LE edema Pulmonary: LCTA, no increased work of breathing, no cough, room air Abdomen: no ascites GU: deferred MSK: sarcopenia, moves all extremities, ambulatory Skin: warm and dry, no rashes or wounds on visible skin Neuro: generalized weakness,  no cognitive impairment Psych: non-anxious affect, A and O x 3 Hem/lymph/immuno: no widespread bruising  CURRENT PROBLEM LIST:  Patient Active Problem List   Diagnosis Date Noted  . Femur fracture, right ([place]) 08/09/2020  . Closed comminuted fracture of proximal ulna, left, initial encounter 04/04/2020  . Protein-calorie malnutrition, severe 07/04/2018  . UTI (urinary tract infection) 07/03/2018  . Nasal bone fractures 07/03/2018  . Knee pain 07/03/2018  . Normal anion gap metabolic acidosis 03/49/1791  . Subdural hematoma ([place]) 07/02/2018  . Recurrent falls 04/16/2017  . 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 At risk for adverse drug event 04/07/2017  . Acute blood loss as cause of postoperative anemia 04/04/2017  . Acute delirium 04/04/2017  . Osteoporosis  04/02/2017  . Closed fracture of neck of left femur ([place]) 03/31/2017  . Scalp contusion 03/31/2017  . Fall at home, initial encounter 03/31/2017  . Hip fracture ([place]) 03/31/2017  . 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 Hx of transient ischemic attack (TIA) 02/19/2017  . Left carotid stenosis- 40-59% 02/19/2017  . Protein calorie malnutrition ([place]) 02/18/2017  . Falls  02/17/2017  . Chest pain 09/26/2016  . Diverticulitis of sigmoid colon 04/25/2016  . Infection due to ESBL-producing Escherichia coli/Diverticular Abscess 03/15/2016  . Diverticulitis 03/12/2016  . Chronic pain syndrome 03/12/2016  . Lesion of pancreas 03/12/2016  . History of chest pain 03/12/2016  . Hypokalemia 03/12/2016  . 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 TIA (transient ischemic attack) 12/26/2015  . DOE (dyspnea on exertion) 12/26/2015  . Right sided weakness 12/17/2015  . Ataxia 12/17/2015  . Shoulder fracture 02/15/2015  . Chronic headache 10/11/2013  . Weakness generalized 08/12/2013  . Dizziness and giddiness 08/12/2013  . Abnormality of gait 07/11/2013  . Hyperlipidemia 05/07/2007  . Migraine 05/07/2007  . PREMATURE VENTRICULAR CONTRACTIONS, FREQUENT 05/07/2007  . GERD (gastroesophageal reflux disease) 05/07/2007  . DIVERTICULOSIS, COLON 05/07/2007  . 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 OSTEOPENIA 05/07/2007  . Depression 03/24/2007  . HEMORRHOIDS 03/24/2007  . ALLERGIC RHINITIS 03/24/2007  . LOW BACK PAIN 03/24/2007  . MIGRAINES, HX OF 03/24/2007   PAST MEDICAL HISTORY:  Active Ambulatory Problems    Diagnosis Date Noted  . Hyperlipidemia 05/07/2007  . Depression 03/24/2007  . Migraine 05/07/2007  . PREMATURE VENTRICULAR CONTRACTIONS, FREQUENT 05/07/2007  . HEMORRHOIDS 03/24/2007  . ALLERGIC RHINITIS 03/24/2007  . GERD (gastroesophageal reflux disease) 05/07/2007  . DIVERTICULOSIS, COLON 05/07/2007  . 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 LOW BACK PAIN 03/24/2007  . OSTEOPENIA 05/07/2007  . MIGRAINES, HX OF 03/24/2007  . Abnormality of gait 07/11/2013  . Weakness generalized 08/12/2013  . Dizziness and giddiness 08/12/2013  . Chronic headache 10/11/2013  . Shoulder fracture 02/15/2015  . Right sided weakness 12/17/2015  . Ataxia 12/17/2015  . TIA  (transient ischemic attack) 12/26/2015  . DOE (dyspnea on exertion) 12/26/2015  . 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 Diverticulitis 03/12/2016  . Chronic pain syndrome 03/12/2016  . Lesion of pancreas 03/12/2016  . History of chest pain 03/12/2016  . Hypokalemia 03/12/2016  . Infection due to ESBL-producing Escherichia coli/Diverticular Abscess 03/15/2016  . Diverticulitis of sigmoid colon 04/25/2016  . Chest pain 09/26/2016  . Falls 02/17/2017  . Protein calorie malnutrition ([place]) 02/18/2017  . Hx of transient ischemic attack (TIA) 02/19/2017  . Left carotid stenosis- 40-59% 02/19/2017  . 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 Closed fracture of neck of left femur ([place]) 03/31/2017  . Scalp contusion 03/31/2017  . Fall at home, initial encounter 03/31/2017  . Hip fracture ([place]) 03/31/2017  . Osteoporosis 04/02/2017  . Acute blood loss as cause of postoperative anemia 04/04/2017  . Acute delirium 04/04/2017  . At risk for adverse drug event 04/07/2017  . Recurrent falls 04/16/2017  . 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 Subdural hematoma ([place]) 07/02/2018  . UTI (urinary tract infection) 07/03/2018  . Nasal bone fractures 07/03/2018  . Knee pain 07/03/2018  . Normal anion gap metabolic acidosis 74/02/1447  . Protein-calorie malnutrition, severe 07/04/2018  . Closed comminuted fracture of proximal ulna, left, initial encounter 04/04/2020  .  Femur fracture, right ([place]) 08/09/2020   Resolved Ambulatory Problems    Diagnosis Date Noted  . Anxiety 03/24/2007  . Hypotension arterial 06/09/2011  . Bradycardia 06/09/2011  . Atypical chest pain 06/09/2011  . LV dysfunction 12/26/2015  . Abnormal nuclear stress test 02/17/2016   Past Medical History:  Diagnosis Date  . Anemia   . Arthritis   . 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 Chronic back pain   . Constipation   . Head injury 2019  .  Headache   . History of blood transfusion   . HLD (hyperlipidemia)   . Mild CAD   . NICM (nonischemic cardiomyopathy) ([place])   . Stroke [facility]) 06/2013   SOCIAL HX:  Social History   Tobacco Use  . Smoking status: Current Some Day Smoker    Packs/day: 0.25    Years: 34.00    Pack years: 8.50    Types: Cigarettes  . Smokeless tobacco: Never Used  Substance Use Topics  . Alcohol use: Not Currently    Alcohol/week: 1.0 standard drink    Types: 1 Standard drinks or equivalent per week    Comment: occ    FAMILY HX:  Family History  Problem Relation Age of Onset  . Lung cancer Mother 60  . Migraines Mother   . Heart attack Father        Vague  . Hypertension Brother   . Esophageal cancer Brother   . Diabetes Paternal Grandmother        questionable  . Colon cancer Neg Hx   . Kidney disease Neg Hx   . Liver disease Neg Hx      ALLERGIES:  Allergies  Allergen Reactions  . Opana [Oxymorphone Hcl] Other (See Comments)    hallucinations  . Penicillins Hives    Has patient had a PCN reaction causing immediate rash, facial/tongue/throat swelling, SOB or lightheadedness with hypotension: Unknown Has patient had a PCN reaction causing severe rash involving mucus membranes or skin necrosis: No Has patient had a PCN reaction that required hospitalization No Has patient had a PCN reaction occurring within the last 10 years: No If all of the above answers are "NO", then may proceed with Cephalosporin use.      PERTINENT MEDICATIONS:  Outpatient Encounter Medications as of 12/14/2020  Medication Sig  . calcium carbonate (OS-CAL) 600 MG TABS tablet Take 1 tablet by mouth daily.  . clonazePAM (KLONOPIN) 1 MG tablet Take 0.5 mg by mouth at bedtime as needed for anxiety.   . docusate sodium (COLACE) 100 MG capsule Take 1 capsule (100 mg total) by mouth 2 (two) times daily. (Patient taking differently: Take 100 mg by mouth daily.)  . FLUoxetine (PROZAC) 40 MG capsule Take 80 mg by mouth  daily.  . Magnesium 500 MG TABS Take 500 mg by mouth daily.  . mometasone (NASONEX) 50 MCG/ACT nasal spray Place 2 sprays into the nose daily.  . Multiple Vitamin (MULTIVITAMIN WITH MINERALS) TABS tablet Take 1 tablet by mouth daily.  Marland Kitchen omeprazole (PRILOSEC) 20 MG capsule Take 20 mg by mouth daily.  Marland Kitchen oxyCODONE 10 MG TABS Take 1 tablet (10 mg total) by mouth every 6 (six) hours as needed for moderate pain. (Patient taking differently: Take 10 mg by mouth 5 (five) times daily.)  . topiramate (TOPAMAX) 100 MG tablet Take 1 tablet (100 mg total) by mouth at bedtime.  . traZODone (DESYREL) 100 MG tablet Take 200 mg by mouth at bedtime.  Marland Kitchen  vitamin B-12 (CYANOCOBALAMIN) 1000 MCG tablet Take 1 tablet (1,000 mcg total) by mouth daily.   No facility-administered encounter medications on file as of 12/14/2020.    Thank you for the opportunity to participate in the care of Ms. Boffa.  The palliative care team will continue to follow. Please call our office at [phone] if we can be of additional assistance.   Jari Favre, DNP, AGPCNP-BC  COVID-19 PATIENT SCREENING TOOL Asked and negative response unless otherwise noted:   Have you had symptoms of covid, tested positive or been in contact with someone with symptoms/positive test in the past 5-10 days?

## 2020-12-14 NOTE — Progress Notes (Signed)
[facility] Palliative Care Consult Note Telephone: [phone]  Fax: [phone]    Date of encounter: 12/14/20 PATIENT NAME: Dorothy White [place] [id number]   [phone] (home)  DOB: [birth year] MRN: [id number]  PRIMARY CARE PROVIDER:    Maurice Small, MD,  [address] [place] [address] [phone]  REFERRING PROVIDER:   Maurice Small, MD [address] [place],  [place] [address] [phone]  RESPONSIBLE PARTY:    Contact Information    Name Relation Home Work [address]   [place] Friend   [phone]   [name], [name] [phone]  [phone]     I met face to face with patient in home. Palliative Care was asked to follow this patient by consultation request of  Maurice Small, MD to address advance care planning and complex medical decision making. This is the initial visit.                                   ASSESSMENT AND PLAN / RECOMMENDATIONS:   Advance Care Planning/Goals of Care: Goals include to maximize quality of life and symptom management.  Visit consisted of building trust and discussions on Palliative care medicine as specialized medical care for people living with serious illness, aimed at facilitating improved quality of life through symptoms relief, assisting with advance care planning and establishing goals of care. Patient expressed appreciation for education provided on Palliative care and how it differs from Hospice service. Our advance care planning conversation included a discussion about:     The value and importance of advance care planning   Experiences with loved ones who have been seriously ill or have died   Exploration of personal, cultural or spiritual beliefs that might influence medical decisions   Exploration of goals of care in the event of a sudden injury or illness   Review and updating or creation of an  advance directive  document .  Decision not to resuscitate or to de-escalate disease focused treatments due to poor prognosis.  CODE STATUS: DNR Goals of care: Patient's goal of care is function.She verbalized desire to regain strength and be able to be independent with her ADLs.  Directives: After discussions on ramifications and implications of code status patient decided to not be resuscitated in the event of cardiac or respiratory arrest. She decided on DNR as her code status. DNR form signed for patient today. Patient advised to keep form in a readily assessable area in her home, so it can be easily seen if needed. She verbalized understanding.  I spent 30 minutes providing this consultation. More than 50% of the time in this consultation was spent in counseling and care coordination. -------------------------------------------------------------------------------  Symptom Management/Plan:  Allergic Rhinitis: Recommend patient start over the counter Certirizine 10mg  as needed daily at bedtime. Advised to limit exposure to pollen or allergen. Continue plan of care for Migraine headaches, may take Tylenol 650mg  every 4-6hrs as needed for headache. Constipation: constipation not relieved with current regimen of Magnesium 500mg  daily. Recommend patient start Miralax 17g by mouth daily, patient to stop if diarrhea and take as needed if no BM in more than 2 days. Protein calorie malnutrition: BMI 17. Weight 105lbs down from baseline weight of 130lbs. Patient receives meals from [employer]. Report weight loss due to poor appetite. Encouraged snacking between meals. Recommend taking nutritional supplements like Ensure  between meals.  Encouraged healthy meals choices from a variety of food groups. Frequent falls: Patient lives alone, she has a house mate/female friend who lives on the upper level of her home. Friend assists her with rides to her medical appointments. She is not on any antihypertensive, BP today is  158/56. Discussed fall prevention strategies such as changing position gradually. Encouraged adequate oral fluid intake. Provided general support and encouragement. Questions and concerns were addressed. Patient was encouraged to call with questions and/or concerns. My business card was provided.  Follow up Palliative Care Visit: Palliative care will continue to follow for complex medical decision making, advance care planning, and clarification of goals. Return in about 4-6 weeks or prn.  PPS: 60%  HOSPICE ELIGIBILITY/DIAGNOSIS: TBD, patient declined Hospice services in the past.  Chief Complaint: watery itchy eyes  History obtained from review of EMR, discussion with primary team, and interview with family, facility staff/caregiver and/or Dorothy White.  HISTORY OF PRESENT ILLNESS:  Dorothy White is a 70 y.o. year old female  with Multisystems atrophy (MSA) cerebellar type, non-obstructive CAD, HLD, depression, anxiety, Migraine headaches (on Topamax).  Patient was diagnosed with multiple systems atrophy a year ago. She report frequent falls from orthostatic hypotension a complication of the disease. Patient with cast on her left arm today secondary to injury sustained from a fall last month. Patient report her falls is related to syncopal episodes due to orthostatic hypotension, report not being unaware of some of the falls, as she sometimes wakes up and find herself on the floor. Patient report being discharged from physical therapy thru [facility] when she was considering hospice care. Patient had a Hospice Admission visit but declined Hospice services. She reiterated desire to not pursue Hospice care at this time saying she want to continue fighting for her life.he verbalized awareness that her MSA is incurable. Patient report having watery and itch eyes with dry cough. She report symptoms started about a week ago and worse when she goes outside her home. She report occasional headaches. She  denied fever, denied chills, denied SOB.   I reviewed available labs, medications, imaging, studies and related documents from the EMR.  Records reviewed and summarized above.   ROS EYES: denies vision changes ENMT: denies dysphagia Cardiovascular: denies chest pain, denies DOE Pulmonary: denies cough, denies increased SOB Abdomen: endorses good appetite, denies constipation, endorses continence of bowel GU: denies dysuria, endorses continence of urine MSK:  endorsed weakness,  frequent fallfalls reported Skin: denies rashes or wounds Neurological: denies uncontrolled pain, denies insomnia Psych: Endorses positive mood Heme/lymph/immuno: denies bruises, abnormal bleeding  Vitals with BMI 12/14/2020 11/01/2020 10/31/2020  Height $Remov'5\' 5"'IzCLdt$  - -  Weight 105 lbs - -  BMI 18.34 - -  Systolic 373 578 978  Diastolic 56 69 67  Pulse 66 52 49   Physical Exam: Weight: 105lbs down from baseline weight of 130lbs. General: chronically ill and frail appearing, thin, NAD  EYES: anicteric sclera, no discharge  ENMT: intact hearing, oral mucous membranes moist CV: S1S2 normal, no LE edema Pulmonary: LCTA, no increased work of breathing, no cough, room air Abdomen: no ascites GU: deferred MSK: sarcopenia, moves all extremities, ambulatory Skin: warm and dry, no rashes or wounds on visible skin Neuro: generalized weakness,  no cognitive impairment Psych: non-anxious affect, A and O x 3 Hem/lymph/immuno: no widespread bruising  CURRENT PROBLEM LIST:  Patient Active Problem List   Diagnosis Date Noted  . Femur fracture, right ([place]) 08/09/2020  . Closed  comminuted fracture of proximal ulna, left, initial encounter 04/04/2020  . Protein-calorie malnutrition, severe 07/04/2018  . UTI (urinary tract infection) 07/03/2018  . Nasal bone fractures 07/03/2018  . Knee pain 07/03/2018  . Normal anion gap metabolic acidosis 43/32/9518  . Subdural hematoma ([place]) 07/02/2018  . Recurrent falls 04/16/2017  .  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 At risk for adverse drug event 04/07/2017  . Acute blood loss as cause of postoperative anemia 04/04/2017  . Acute delirium 04/04/2017  . Osteoporosis 04/02/2017  . Closed fracture of neck of left femur ([place]) 03/31/2017  . Scalp contusion 03/31/2017  . Fall at home, initial encounter 03/31/2017  . Hip fracture ([place]) 03/31/2017  . 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 Hx of transient ischemic attack (TIA) 02/19/2017  . Left carotid stenosis- 40-59% 02/19/2017  . Protein calorie malnutrition ([place]) 02/18/2017  . Falls 02/17/2017  . Chest pain 09/26/2016  . Diverticulitis of sigmoid colon 04/25/2016  . Infection due to ESBL-producing Escherichia coli/Diverticular Abscess 03/15/2016  . Diverticulitis 03/12/2016  . Chronic pain syndrome 03/12/2016  . Lesion of pancreas 03/12/2016  . History of chest pain 03/12/2016  . Hypokalemia 03/12/2016  . Angina, class IV ([place]) - chest tightness and pressure with dyspnea with minimal exertion 02/17/2016    Class: Question of  . TIA (transient ischemic attack) 12/26/2015  . DOE (dyspnea on exertion) 12/26/2015  . Right sided weakness 12/17/2015  . Ataxia 12/17/2015  . Shoulder fracture 02/15/2015  . Chronic headache 10/11/2013  . Weakness generalized 08/12/2013  . Dizziness and giddiness 08/12/2013  . Abnormality of gait 07/11/2013  . Hyperlipidemia 05/07/2007  . Migraine 05/07/2007  . PREMATURE VENTRICULAR CONTRACTIONS, FREQUENT 05/07/2007  . GERD (gastroesophageal reflux disease) 05/07/2007  . DIVERTICULOSIS, COLON 05/07/2007  . 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 OSTEOPENIA 05/07/2007  . Depression 03/24/2007  . HEMORRHOIDS 03/24/2007  . ALLERGIC RHINITIS 03/24/2007  . LOW BACK PAIN 03/24/2007  . MIGRAINES, HX OF 03/24/2007   PAST MEDICAL HISTORY:  Active Ambulatory Problems    Diagnosis Date Noted  . Hyperlipidemia 05/07/2007  . Depression 03/24/2007  . Migraine 05/07/2007   . PREMATURE VENTRICULAR CONTRACTIONS, FREQUENT 05/07/2007  . HEMORRHOIDS 03/24/2007  . ALLERGIC RHINITIS 03/24/2007  . GERD (gastroesophageal reflux disease) 05/07/2007  . DIVERTICULOSIS, COLON 05/07/2007  . 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 LOW BACK PAIN 03/24/2007  . OSTEOPENIA 05/07/2007  . MIGRAINES, HX OF 03/24/2007  . Abnormality of gait 07/11/2013  . Weakness generalized 08/12/2013  . Dizziness and giddiness 08/12/2013  . Chronic headache 10/11/2013  . Shoulder fracture 02/15/2015  . Right sided weakness 12/17/2015  . Ataxia 12/17/2015  . TIA (transient ischemic attack) 12/26/2015  . DOE (dyspnea on exertion) 12/26/2015  . 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 Diverticulitis 03/12/2016  . Chronic pain syndrome 03/12/2016  . Lesion of pancreas 03/12/2016  . History of chest pain 03/12/2016  . Hypokalemia 03/12/2016  . Infection due to ESBL-producing Escherichia coli/Diverticular Abscess 03/15/2016  . Diverticulitis of sigmoid colon 04/25/2016  . Chest pain 09/26/2016  . Falls 02/17/2017  . Protein calorie malnutrition ([place]) 02/18/2017  . Hx of transient ischemic attack (TIA) 02/19/2017  . Left carotid stenosis- 40-59% 02/19/2017  . 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 Closed fracture of neck of left femur ([place]) 03/31/2017  . Scalp contusion 03/31/2017  . Fall at home, initial encounter 03/31/2017  . Hip fracture ([place]) 03/31/2017  . Osteoporosis 04/02/2017  .  Acute blood loss as cause of postoperative anemia 04/04/2017  . Acute delirium 04/04/2017  . At risk for adverse drug event 04/07/2017  . Recurrent falls 04/16/2017  . 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 Subdural hematoma ([place]) 07/02/2018  . UTI (urinary tract infection) 07/03/2018  . Nasal bone fractures 07/03/2018  . Knee pain 07/03/2018  . Normal anion gap metabolic acidosis 62/95/2841  . Protein-calorie malnutrition,  severe 07/04/2018  . Closed comminuted fracture of proximal ulna, left, initial encounter 04/04/2020  . Femur fracture, right ([place]) 08/09/2020   Resolved Ambulatory Problems    Diagnosis Date Noted  . Anxiety 03/24/2007  . Hypotension arterial 06/09/2011  . Bradycardia 06/09/2011  . Atypical chest pain 06/09/2011  . LV dysfunction 12/26/2015  . Abnormal nuclear stress test 02/17/2016   Past Medical History:  Diagnosis Date  . Anemia   . Arthritis   . 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 Chronic back pain   . Constipation   . Head injury 2019  . Headache   . History of blood transfusion   . HLD (hyperlipidemia)   . Mild CAD   . NICM (nonischemic cardiomyopathy) ([place])   . Stroke [facility]) 06/2013   SOCIAL HX:  Social History   Tobacco Use  . Smoking status: Current Some Day Smoker    Packs/day: 0.25    Years: 34.00    Pack years: 8.50    Types: Cigarettes  . Smokeless tobacco: Never Used  Substance Use Topics  . Alcohol use: Not Currently    Alcohol/week: 1.0 standard drink    Types: 1 Standard drinks or equivalent per week    Comment: occ    FAMILY HX:  Family History  Problem Relation Age of Onset  . Lung cancer Mother 55  . Migraines Mother   . Heart attack Father        Vague  . Hypertension Brother   . Esophageal cancer Brother   . Diabetes Paternal Grandmother        questionable  . Colon cancer Neg Hx   . Kidney disease Neg Hx   . Liver disease Neg Hx      ALLERGIES:  Allergies  Allergen Reactions  . Opana [Oxymorphone Hcl] Other (See Comments)    hallucinations  . Penicillins Hives    Has patient had a PCN reaction causing immediate rash, facial/tongue/throat swelling, SOB or lightheadedness with hypotension: Unknown Has patient had a PCN reaction causing severe rash involving mucus membranes or skin necrosis: No Has patient had a PCN reaction that required hospitalization No Has patient had a PCN reaction occurring within the last 10 years:  No If all of the above answers are "NO", then may proceed with Cephalosporin use.      PERTINENT MEDICATIONS:  Outpatient Encounter Medications as of 12/14/2020  Medication Sig  . calcium carbonate (OS-CAL) 600 MG TABS tablet Take 1 tablet by mouth daily.  . clonazePAM (KLONOPIN) 1 MG tablet Take 0.5 mg by mouth at bedtime as needed for anxiety.   . docusate sodium (COLACE) 100 MG capsule Take 1 capsule (100 mg total) by mouth 2 (two) times daily. (Patient taking differently: Take 100 mg by mouth daily.)  . FLUoxetine (PROZAC) 40 MG capsule Take 80 mg by mouth daily.  . Magnesium 500 MG TABS Take 500 mg by mouth daily.  . mometasone (NASONEX) 50 MCG/ACT nasal spray Place 2 sprays into the nose daily.  . Multiple Vitamin (MULTIVITAMIN WITH MINERALS)  TABS tablet Take 1 tablet by mouth daily.  Marland Kitchen omeprazole (PRILOSEC) 20 MG capsule Take 20 mg by mouth daily.  Marland Kitchen oxyCODONE 10 MG TABS Take 1 tablet (10 mg total) by mouth every 6 (six) hours as needed for moderate pain. (Patient taking differently: Take 10 mg by mouth 5 (five) times daily.)  . topiramate (TOPAMAX) 100 MG tablet Take 1 tablet (100 mg total) by mouth at bedtime.  . traZODone (DESYREL) 100 MG tablet Take 200 mg by mouth at bedtime.  . vitamin B-12 (CYANOCOBALAMIN) 1000 MCG tablet Take 1 tablet (1,000 mcg total) by mouth daily.   No facility-administered encounter medications on file as of 12/14/2020.    Thank you for the opportunity to participate in the care of Dorothy White.  The palliative care team will continue to follow. Please call our office at [phone] if we can be of additional assistance.   Jari Favre, DNP, AGPCNP-BC  COVID-19 PATIENT SCREENING TOOL Asked and negative response unless otherwise noted:   Have you had symptoms of covid, tested positive or been in contact with someone with symptoms/positive test in the past 5-10 days?

## 2020-12-24 ENCOUNTER — Other Ambulatory Visit: Payer: Self-pay

## 2020-12-24 ENCOUNTER — Encounter (HOSPITAL_COMMUNITY): Payer: Self-pay | Admitting: *Deleted

## 2020-12-24 ENCOUNTER — Emergency Department (HOSPITAL_COMMUNITY): Payer: Medicare Other

## 2020-12-24 ENCOUNTER — Emergency Department (HOSPITAL_COMMUNITY)
Admission: EM | Admit: 2020-12-24 | Discharge: 2020-12-24 | Disposition: A | Discharge status: Home / Self Care | Payer: Medicare Other | Attending: Emergency Medicine | Admitting: Emergency Medicine

## 2020-12-24 DIAGNOSIS — I2510 Atherosclerotic heart disease of native coronary artery without angina pectoris: Secondary | ICD-10-CM | POA: Insufficient documentation

## 2020-12-24 DIAGNOSIS — G43909 Migraine, unspecified, not intractable, without status migrainosus: Secondary | ICD-10-CM | POA: Diagnosis not present

## 2020-12-24 DIAGNOSIS — G43009 Migraine without aura, not intractable, without status migrainosus: Secondary | ICD-10-CM

## 2020-12-24 DIAGNOSIS — Z96641 Presence of right artificial hip joint: Secondary | ICD-10-CM | POA: Insufficient documentation

## 2020-12-24 DIAGNOSIS — F17210 Nicotine dependence, cigarettes, uncomplicated: Secondary | ICD-10-CM | POA: Diagnosis not present

## 2020-12-24 DIAGNOSIS — R519 Headache, unspecified: Secondary | ICD-10-CM | POA: Diagnosis not present

## 2020-12-24 DIAGNOSIS — Z85828 Personal history of other malignant neoplasm of skin: Secondary | ICD-10-CM | POA: Diagnosis not present

## 2020-12-24 DIAGNOSIS — I10 Essential (primary) hypertension: Secondary | ICD-10-CM | POA: Diagnosis not present

## 2020-12-24 LAB — COMPREHENSIVE METABOLIC PANEL
ALT: 12 U/L (ref 0–44)
AST: 20 U/L (ref 15–41)
Albumin: 4.1 g/dL (ref 3.5–5.0)
Alkaline Phosphatase: 109 U/L (ref 38–126)
Anion gap: 7 (ref 5–15)
BUN: 16 mg/dL (ref 8–23)
CO2: 26 mmol/L (ref 22–32)
Calcium: 9.4 mg/dL (ref 8.9–10.3)
Chloride: 108 mmol/L (ref 98–111)
Creatinine, Ser: 0.6 mg/dL (ref 0.44–1.00)
GFR, Estimated: >60 mL/min (ref >60)
Glucose, Bld: 129 mg/dL — ABNORMAL HIGH (ref 70–99)
Potassium: 3.2 mmol/L — ABNORMAL LOW (ref 3.5–5.1)
Sodium: 141 mmol/L (ref 135–145)
Total Bilirubin: 0.5 mg/dL (ref 0.3–1.2)
Total Protein: 6.9 g/dL (ref 6.5–8.1)

## 2020-12-24 LAB — CBC
HCT: 41.7 % (ref 36.0–46.0)
Hemoglobin: 12.3 g/dL (ref 12.0–15.0)
MCH: 22.6 pg — ABNORMAL LOW (ref 26.0–34.0)
MCHC: 29.5 g/dL — ABNORMAL LOW (ref 30.0–36.0)
MCV: 76.5 fL — ABNORMAL LOW (ref 80.0–100.0)
Platelets: 314 10*3/uL (ref 150–400)
RBC: 5.45 MIL/uL — ABNORMAL HIGH (ref 3.87–5.11)
RDW: 16.6 % — ABNORMAL HIGH (ref 11.5–15.5)
WBC: 5.9 10*3/uL (ref 4.0–10.5)
nRBC: 0 % (ref 0.0–0.2)

## 2020-12-24 MED ORDER — METOCLOPRAMIDE HCL 5 MG/ML IJ SOLN
10.0000 mg | Freq: Once | INTRAMUSCULAR | Status: DC
  Filled 2020-12-24: qty 2

## 2020-12-24 MED ORDER — PROCHLORPERAZINE EDISYLATE 10 MG/2ML IJ SOLN
10.0000 mg | Freq: Once | INTRAMUSCULAR | Status: AC
  Administered 2020-12-24: 10 mg via INTRAVENOUS
  Filled 2020-12-24: qty 2

## 2020-12-24 MED ORDER — ACETAMINOPHEN 500 MG PO TABS
1000.0000 mg | ORAL_TABLET | Freq: Once | ORAL | Status: DC
  Filled 2020-12-24: qty 2

## 2020-12-24 MED ORDER — SODIUM CHLORIDE 0.9 % IV BOLUS: 500.0000 mL | Freq: Once | INTRAVENOUS | Status: DC

## 2020-12-24 MED ORDER — DIPHENHYDRAMINE HCL 50 MG/ML IJ SOLN
25.0000 mg | Freq: Once | INTRAMUSCULAR | Status: AC
  Administered 2020-12-24: 25 mg via INTRAVENOUS
  Filled 2020-12-24: qty 1

## 2020-12-24 NOTE — ED Provider Notes (Signed)
[facility] DEPT Provider Note   CSN: [id number] Arrival date & time: 12/24/20  1446     History Chief Complaint  Patient presents with  . Migraine    Dorothy White is a 70 y.o. female.  HPI 70 year old female with a history of anemia, anxiety, depression, migraines presents to the ER with complaints of migraine.  Patient states she has a history of migraines in the past, and this feels similar.  There was no thunderclap quality to the onset of her headache.  No falls or head injuries.  Not on blood thinners.  Headache is temporal and left-sided.  No blurry vision.  She has felt a bit nauseous.  She states that she has not been eating or drinking well due to the nausea.  She normally takes sumatriptan, however this has not been relieving her headaches.  Denies any unilateral weakness, slurred or difficulty speaking, dizziness    Past Medical History:  Diagnosis Date  . Anemia    ?  Marland Kitchen Anxiety   . Arthritis    "everywhere" (04/25/2016)  . 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 Chronic back pain    "all over" (04/25/2016)  . Constipation   . Depression   . Diverticulitis   . GERD (gastroesophageal reflux disease)   . Head injury 2019   subdural hematoma  . Headache    "weekly" (04/25/2016)  . 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 History of blood transfusion   . HLD (hyperlipidemia)    hx (04/25/2016)  . 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 Mild CAD    a. 25% mLAD, otherwise no sig disease 01/2016 cath.  Marland Kitchen NICM (nonischemic cardiomyopathy) ([place])    a. EF 40-45% by echo 12/2945 at time of complicated migraine/neuro sx, 55-65% at time of cath 01/2016  . Stroke [facility]) 06/2013   "mini" stroke , ?possibly hemaplegic migraine per pt    Patient Active Problem List   Diagnosis Date Noted  . Femur fracture, right ([place]) 08/09/2020  . Closed comminuted fracture of proximal ulna, left,  initial encounter 04/04/2020  . Protein-calorie malnutrition, severe 07/04/2018  . UTI (urinary tract infection) 07/03/2018  . Nasal bone fractures 07/03/2018  . Knee pain 07/03/2018  . Normal anion gap metabolic acidosis 65/46/5035  . Subdural hematoma ([place]) 07/02/2018  . Recurrent falls 04/16/2017  . 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 At risk for adverse drug event 04/07/2017  . Acute blood loss as cause of postoperative anemia 04/04/2017  . Acute delirium 04/04/2017  . Osteoporosis 04/02/2017  . Closed fracture of neck of left femur ([place]) 03/31/2017  . Scalp contusion 03/31/2017  . Fall at home, initial encounter 03/31/2017  . Hip fracture ([place]) 03/31/2017  . 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 Hx of transient ischemic attack (TIA) 02/19/2017  . Left carotid stenosis- 40-59% 02/19/2017  . Protein calorie malnutrition ([place]) 02/18/2017  . Falls 02/17/2017  . Chest pain 09/26/2016  . Diverticulitis of sigmoid colon 04/25/2016  . Infection due to ESBL-producing Escherichia coli/Diverticular Abscess 03/15/2016  . Diverticulitis 03/12/2016  . Chronic pain syndrome 03/12/2016  . Lesion of pancreas 03/12/2016  . History of chest pain 03/12/2016  . Hypokalemia 03/12/2016  . Angina, class IV ([place]) - chest tightness and pressure with dyspnea with minimal exertion 02/17/2016    Class: Question of  . TIA (transient ischemic attack) 12/26/2015  . DOE (dyspnea on exertion) 12/26/2015  .  Right sided weakness 12/17/2015  . Ataxia 12/17/2015  . Shoulder fracture 02/15/2015  . Chronic headache 10/11/2013  . Weakness generalized 08/12/2013  . Dizziness and giddiness 08/12/2013  . Abnormality of gait 07/11/2013  . Hyperlipidemia 05/07/2007  . Migraine 05/07/2007  . PREMATURE VENTRICULAR CONTRACTIONS, FREQUENT 05/07/2007  . GERD (gastroesophageal reflux disease) 05/07/2007  . DIVERTICULOSIS, COLON 05/07/2007  . 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 OSTEOPENIA  05/07/2007  . Depression 03/24/2007  . HEMORRHOIDS 03/24/2007  . ALLERGIC RHINITIS 03/24/2007  . LOW BACK PAIN 03/24/2007  . MIGRAINES, HX OF 03/24/2007    Past Surgical History:  Procedure Laterality Date  . ANTERIOR APPROACH HEMI HIP ARTHROPLASTY Left 04/01/2017   Procedure: LEFT DIRECT ANTERIOR TOTAL HIP REPLACEMENT;  Surgeon: Leandrew Koyanagi, MD;  Location: [facility];  Service: Orthopedics;  Laterality: Left;  LEFT DIRECT ANTERIOR TOTAL HIP REPLACEMENT  . AUGMENTATION MAMMAPLASTY  1980  . BASAL CELL CARCINOMA EXCISION     "tip of my nose"  . BUNIONECTOMY Bilateral 10/2003  . CARDIAC CATHETERIZATION N/A 02/20/2016   Procedure: Left Heart Cath and Coronary Angiography;  Surgeon: Jettie Booze, MD;  Location: [facility];  Service: Cardiovascular;  Laterality: N/A;  . COLONOSCOPY    . DILATION AND CURETTAGE OF UTERUS    . FRACTURE SURGERY    . IR GENERIC HISTORICAL  03/18/2016   IR SINUS/FIST TUBE CHK-NON GI 03/18/2016 Aletta Edouard, MD [facility]-INTER[facility] RAD  . LAPAROSCOPIC SIGMOID COLECTOMY N/A 04/25/2016   Procedure: LAPAROSCOPIC SIGMOID COLECTOMY;  Surgeon: Stark Klein, MD;  Location: [facility];  Service: General;  Laterality: N/A;  . OOPHORECTOMY Bilateral ~ 1999  . ORIF HUMERUS FRACTURE Right 02/15/2015   Procedure: OPEN REDUCTION INTERNAL FIXATION (ORIF) PROXIMAL HUMERUS FRACTURE;  Surgeon: Netta Cedars, MD;  Location: [facility];  Service: Orthopedics;  Laterality: Right;  . ORIF HUMERUS FRACTURE Left 04/04/2020  . ORIF HUMERUS FRACTURE Left 04/04/2020   Procedure: OPEN REDUCTION INTERNAL FIXATION (ORIF) PROXIMAL HUMERUS FRACTURE;  Surgeon: Iran Planas, MD;  Location: [facility];  Service: Orthopedics;  Laterality: Left;  regional block  . ORIF ULNAR FRACTURE Left 04/04/2020  . ORIF ULNAR FRACTURE Left 04/04/2020   Procedure: OPEN REDUCTION INTERNAL FIXATION (ORIF) ULNAR FRACTURE;  Surgeon: Iran Planas, MD;  Location: [place];  Service: Orthopedics;  Laterality: Left;  with regional block  .  RHINOPLASTY  1976  . TONSILLECTOMY    . TOTAL HIP ARTHROPLASTY Right 08/11/2020   Procedure: TOTAL HIP ARTHROPLASTY ANTERIOR APPROACH;  Surgeon: Rod Can, MD;  Location: [facility];  Service: Orthopedics;  Laterality: Right;  . TUBAL LIGATION  ~ 1983  . VAGINAL HYSTERECTOMY  ~ 1998     OB History    Gravida  0   Para  0   Term  0   Preterm  0   AB  0   Living  0     SAB  0   IAB  0   Ectopic  0   Multiple  0   Live Births  0           Family History  Problem Relation Age of Onset  . Lung cancer Mother 68  . Migraines Mother   . Heart attack Father        Vague  . Hypertension Brother   . Esophageal cancer Brother   . Diabetes Paternal Grandmother        questionable  . Colon cancer Neg Hx   . Kidney disease Neg Hx   .  Liver disease Neg Hx     Social History   Tobacco Use  . Smoking status: Current Some Day Smoker    Packs/day: 0.25    Years: 34.00    Pack years: 8.50    Types: Cigarettes  . Smokeless tobacco: Never Used  Vaping Use  . Vaping Use: Never used  Substance Use Topics  . Alcohol use: Not Currently    Alcohol/week: 1.0 standard drink    Types: 1 Standard drinks or equivalent per week    Comment: occ   . Drug use: No    Types: Cocaine    Comment: Denies any drug use 02/19/18    Home Medications Prior to Admission medications   Medication Sig Start Date End Date Taking? Authorizing Provider  calcium carbonate (OS-CAL) 600 MG TABS tablet Take 1 tablet by mouth daily.    [provider]  clonazePAM (KLONOPIN) 1 MG tablet Take 0.5 mg by mouth at bedtime as needed for anxiety.  03/21/20   [provider]  docusate sodium (COLACE) 100 MG capsule Take 1 capsule (100 mg total) by mouth 2 (two) times daily. Patient taking differently: Take 100 mg by mouth daily. 08/14/20   Shelly Coss, MD  FLUoxetine (PROZAC) 40 MG capsule Take 80 mg by mouth daily. 04/18/18   [provider]  Magnesium 500 MG TABS Take 500  mg by mouth daily.    [provider]  mometasone (NASONEX) 50 MCG/ACT nasal spray Place 2 sprays into the nose daily. 04/22/17   Medina-Vargas, Monina C, NP  Multiple Vitamin (MULTIVITAMIN WITH MINERALS) TABS tablet Take 1 tablet by mouth daily. 07/07/18   Hosie Poisson, MD  omeprazole (PRILOSEC) 20 MG capsule Take 20 mg by mouth daily.    [provider]  oxyCODONE 10 MG TABS Take 1 tablet (10 mg total) by mouth every 6 (six) hours as needed for moderate pain. Patient taking differently: Take 10 mg by mouth 5 (five) times daily. 08/14/20   Swinteck, Aaron Edelman, MD  topiramate (TOPAMAX) 100 MG tablet Take 1 tablet (100 mg total) by mouth at bedtime. 12/28/17   Kathrynn Ducking, MD  traZODone (DESYREL) 100 MG tablet Take 200 mg by mouth at bedtime. 07/16/20   [provider]  vitamin B-12 (CYANOCOBALAMIN) 1000 MCG tablet Take 1 tablet (1,000 mcg total) by mouth daily. 02/19/17   Debbe Odea, MD    Allergies    Opana [oxymorphone hcl] and Penicillins  Review of Systems   Review of Systems  Constitutional: Negative for chills and fever.  HENT: Negative for ear pain and sore throat.   Eyes: Negative for pain and visual disturbance.  Respiratory: Negative for cough and shortness of breath.   Cardiovascular: Negative for chest pain and palpitations.  Gastrointestinal: Negative for abdominal pain and vomiting.  Genitourinary: Negative for dysuria and hematuria.  Musculoskeletal: Negative for arthralgias and back pain.  Skin: Negative for color change and rash.  Neurological: Positive for headaches. Negative for dizziness, seizures, syncope and weakness.  All other systems reviewed and are negative.   Physical Exam Updated Vital Signs BP (!) 143/85   Pulse 67   Temp 98.3 F (36.8 C) (Oral)   Resp 17   Ht 5\' 5"  (1.651 m)   Wt 46.7 kg   SpO2 98%   BMI 17.14 kg/m   Physical Exam Vitals and nursing note reviewed.  Constitutional:      General: She is not in acute  distress.    Appearance: She is  well-developed.  HENT:     Head: Normocephalic and atraumatic.  Eyes:     Conjunctiva/sclera: Conjunctivae normal.  Cardiovascular:     Rate and Rhythm: Normal rate and regular rhythm.     Heart sounds: No murmur heard.   Pulmonary:     Effort: Pulmonary effort is normal. No respiratory distress.     Breath sounds: Normal breath sounds.  Abdominal:     Palpations: Abdomen is soft.     Tenderness: There is no abdominal tenderness.  Musculoskeletal:     Cervical back: Neck supple.  Skin:    General: Skin is warm and dry.  Neurological:     General: No focal deficit present.     Mental Status: She is alert and oriented to person, place, and time.     Comments: Mental Status:  Alert, thought content appropriate, able to give a coherent history. Speech fluent without evidence of aphasia. Able to follow 2 step commands without difficulty.  Cranial Nerves:  II: Peripheral visual fields grossly normal, pupils equal, round, reactive to light III,IV, VI: ptosis not present, extra-ocular motions intact bilaterally  V,VII: smile symmetric, facial light touch sensation equal VIII: hearing grossly normal to voice  X: uvula elevates symmetrically  XI: bilateral shoulder shrug symmetric and strong XII: midline tongue extension without fassiculations Motor:  Normal tone. 5/5 strength of BUE and BLE major muscle groups including strong and equal grip strength and dorsiflexion/plantar flexion Sensory: light touch normal in all extremities. Cerebellar: normal finger-to-nose with bilateral upper extremities, Romberg sign absent Gait: normal gait and balance. Able to walk on toes and heels with ease.    Psychiatric:        Mood and Affect: Mood normal.        Behavior: Behavior normal.     ED Results / Procedures / Treatments   Labs (all labs ordered are listed, but only abnormal results are displayed) Labs Reviewed  CBC - Abnormal; Notable for the  following components:      Result Value   RBC 5.45 (*)    MCV 76.5 (*)    MCH 22.6 (*)    MCHC 29.5 (*)    RDW 16.6 (*)    All other components within normal limits  COMPREHENSIVE METABOLIC PANEL - Abnormal; Notable for the following components:   Potassium 3.2 (*)    Glucose, Bld 129 (*)    All other components within normal limits    EKG None  Radiology CT Head Wo Contrast  Result Date: 12/24/2020 CLINICAL DATA:  Worsening headache. EXAM: CT HEAD WITHOUT CONTRAST TECHNIQUE: Contiguous axial images were obtained from the base of the skull through the vertex without intravenous contrast. COMPARISON:  October 31, 2020 FINDINGS: Brain: No evidence of acute infarction, hemorrhage, hydrocephalus, extra-axial collection or mass lesion/mass effect. Vascular: No hyperdense vessel or unexpected calcification. Skull: Normal. Negative for fracture or focal lesion. Sinuses/Orbits: No acute finding. Other: None. IMPRESSION: No acute intracranial abnormality. Mild brain parenchymal volume loss. Electronically Signed   By: Fidela Salisbury M.D.   On: 12/24/2020 15:51    Procedures Procedures   Medications Ordered in ED Medications  metoCLOPramide (REGLAN) injection 10 mg (10 mg Intravenous Patient Refused/Not Given 12/24/20 1725)  acetaminophen (TYLENOL) tablet 1,000 mg (1,000 mg Oral Patient Refused/Not Given 12/24/20 1725)  sodium chloride 0.9 % bolus 500 mL (500 mLs Intravenous Patient Refused/Not Given 12/24/20 1725)  prochlorperazine (COMPAZINE) injection 10 mg (10 mg Intravenous Given 12/24/20 1617)  diphenhydrAMINE (BENADRYL) injection 25 mg (  25 mg Intravenous Given 12/24/20 1617)    ED Course  I have reviewed the triage vital signs and the nursing notes.  Pertinent labs & imaging results that were available during my care of the patient were reviewed by me and considered in my medical decision making (see chart for details).    MDM Rules/Calculators/A&P                           70 year old female presents to the ER with complaints of headache.  Prior history of migraines.  On arrival, she is well-appearing, no acute distress, resting comfortably in ER bed.  Neuro exam normal.  Labs and imaging overall reassuring.  CT of the head without any evidence of new stroke.  Patient was given migraine cocktail, reports little relief.  Offered Tylenol, Reglan, fluids however the patient refused this.  I was informed by nursing staff that the patient had walked out of the ER.  I did get to speak to her as she left, we discussed return precautions, patient states that her headache does feel slightly better now.  Encouraged neurology follow-up.  She voiced understanding and is agreeable.  Stable for discharge  Case discussed with Dr. Ronnald Nian who is agreeable to the above plan and disposition.  Final Clinical Impression(s) / ED Diagnoses Final diagnoses:  Migraine without aura and without status migrainosus, not intractable    Rx / DC Orders ED Discharge Orders    None       Garald Balding, PA-C 12/24/20 1814    Lennice Sites, DO 12/24/20 2153

## 2020-12-24 NOTE — Discharge Instructions (Signed)
Please follow-up with your neurologist.  Return to the ER for any new or worsening symptoms.

## 2020-12-24 NOTE — ED Notes (Signed)
Pt stated she wanted to go home now, walked off and left. IV was taken out nurse in triage.

## 2020-12-24 NOTE — ED Triage Notes (Signed)
Migraine x 2 days, now feels weak, states she is nauseated. Not eating and drinking well due to the nausea.

## 2020-12-27 DIAGNOSIS — S52022D Displaced fracture of olecranon process without intraarticular extension of left ulna, subsequent encounter for closed fracture with routine healing: Secondary | ICD-10-CM | POA: Diagnosis not present

## 2021-01-02 ENCOUNTER — Emergency Department (HOSPITAL_COMMUNITY): Payer: Medicare Other

## 2021-01-02 ENCOUNTER — Encounter (HOSPITAL_COMMUNITY): Payer: Self-pay | Admitting: *Deleted

## 2021-01-02 ENCOUNTER — Emergency Department (HOSPITAL_COMMUNITY)
Admission: EM | Admit: 2021-01-02 | Discharge: 2021-01-02 | Disposition: A | Discharge status: Home / Self Care | Payer: Medicare Other | Attending: Emergency Medicine | Admitting: Emergency Medicine

## 2021-01-02 ENCOUNTER — Other Ambulatory Visit: Payer: Self-pay

## 2021-01-02 DIAGNOSIS — F17210 Nicotine dependence, cigarettes, uncomplicated: Secondary | ICD-10-CM | POA: Diagnosis not present

## 2021-01-02 DIAGNOSIS — Z79899 Other long term (current) drug therapy: Secondary | ICD-10-CM | POA: Insufficient documentation

## 2021-01-02 DIAGNOSIS — Z85828 Personal history of other malignant neoplasm of skin: Secondary | ICD-10-CM | POA: Insufficient documentation

## 2021-01-02 DIAGNOSIS — Z96643 Presence of artificial hip joint, bilateral: Secondary | ICD-10-CM | POA: Diagnosis not present

## 2021-01-02 DIAGNOSIS — S40011A Contusion of right shoulder, initial encounter: Secondary | ICD-10-CM | POA: Insufficient documentation

## 2021-01-02 DIAGNOSIS — M47812 Spondylosis without myelopathy or radiculopathy, cervical region: Secondary | ICD-10-CM | POA: Diagnosis not present

## 2021-01-02 DIAGNOSIS — Z043 Encounter for examination and observation following other accident: Secondary | ICD-10-CM | POA: Diagnosis not present

## 2021-01-02 DIAGNOSIS — R296 Repeated falls: Secondary | ICD-10-CM | POA: Diagnosis not present

## 2021-01-02 DIAGNOSIS — S0990XA Unspecified injury of head, initial encounter: Secondary | ICD-10-CM | POA: Diagnosis not present

## 2021-01-02 DIAGNOSIS — S01111A Laceration without foreign body of right eyelid and periocular area, initial encounter: Secondary | ICD-10-CM | POA: Diagnosis not present

## 2021-01-02 DIAGNOSIS — M810 Age-related osteoporosis without current pathological fracture: Secondary | ICD-10-CM | POA: Diagnosis not present

## 2021-01-02 DIAGNOSIS — Y92009 Unspecified place in unspecified non-institutional (private) residence as the place of occurrence of the external cause: Secondary | ICD-10-CM | POA: Insufficient documentation

## 2021-01-02 DIAGNOSIS — R42 Dizziness and giddiness: Secondary | ICD-10-CM | POA: Diagnosis not present

## 2021-01-02 DIAGNOSIS — S51812A Laceration without foreign body of left forearm, initial encounter: Secondary | ICD-10-CM | POA: Diagnosis not present

## 2021-01-02 DIAGNOSIS — Z96641 Presence of right artificial hip joint: Secondary | ICD-10-CM | POA: Diagnosis not present

## 2021-01-02 DIAGNOSIS — R220 Localized swelling, mass and lump, head: Secondary | ICD-10-CM | POA: Diagnosis not present

## 2021-01-02 DIAGNOSIS — S0591XA Unspecified injury of right eye and orbit, initial encounter: Secondary | ICD-10-CM | POA: Diagnosis present

## 2021-01-02 DIAGNOSIS — Z9882 Breast implant status: Secondary | ICD-10-CM | POA: Diagnosis not present

## 2021-01-02 DIAGNOSIS — W19XXXA Unspecified fall, initial encounter: Secondary | ICD-10-CM

## 2021-01-02 DIAGNOSIS — M19011 Primary osteoarthritis, right shoulder: Secondary | ICD-10-CM | POA: Diagnosis not present

## 2021-01-02 DIAGNOSIS — W010XXA Fall on same level from slipping, tripping and stumbling without subsequent striking against object, initial encounter: Secondary | ICD-10-CM | POA: Diagnosis not present

## 2021-01-02 DIAGNOSIS — S0993XA Unspecified injury of face, initial encounter: Secondary | ICD-10-CM | POA: Diagnosis not present

## 2021-01-02 DIAGNOSIS — Z96642 Presence of left artificial hip joint: Secondary | ICD-10-CM | POA: Diagnosis not present

## 2021-01-02 DIAGNOSIS — S299XXA Unspecified injury of thorax, initial encounter: Secondary | ICD-10-CM | POA: Diagnosis not present

## 2021-01-02 DIAGNOSIS — R921 Mammographic calcification found on diagnostic imaging of breast: Secondary | ICD-10-CM | POA: Diagnosis not present

## 2021-01-02 LAB — BASIC METABOLIC PANEL
Anion gap: 7 (ref 5–15)
BUN: 17 mg/dL (ref 8–23)
CO2: 23 mmol/L (ref 22–32)
Calcium: 9.1 mg/dL (ref 8.9–10.3)
Chloride: 107 mmol/L (ref 98–111)
Creatinine, Ser: 0.72 mg/dL (ref 0.44–1.00)
GFR, Estimated: >60 mL/min (ref >60)
Glucose, Bld: 94 mg/dL (ref 70–99)
Potassium: 4.6 mmol/L (ref 3.5–5.1)
Sodium: 137 mmol/L (ref 135–145)

## 2021-01-02 LAB — CBC WITH DIFFERENTIAL/PLATELET
Abs Immature Granulocytes: 0.03 10*3/uL (ref 0.00–0.07)
Basophils Absolute: 0 10*3/uL (ref 0.0–0.1)
Basophils Relative: 0 %
Eosinophils Absolute: 0.2 10*3/uL (ref 0.0–0.5)
Eosinophils Relative: 2 %
HCT: 40.4 % (ref 36.0–46.0)
Hemoglobin: 11.6 g/dL — ABNORMAL LOW (ref 12.0–15.0)
Immature Granulocytes: 0 %
Lymphocytes Relative: 15 %
Lymphs Abs: 1.2 10*3/uL (ref 0.7–4.0)
MCH: 23.6 pg — ABNORMAL LOW (ref 26.0–34.0)
MCHC: 28.7 g/dL — ABNORMAL LOW (ref 30.0–36.0)
MCV: 82.1 fL (ref 80.0–100.0)
Monocytes Absolute: 0.8 10*3/uL (ref 0.1–1.0)
Monocytes Relative: 10 %
Neutro Abs: 5.9 10*3/uL (ref 1.7–7.7)
Neutrophils Relative %: 73 %
Platelets: 215 10*3/uL (ref 150–400)
RBC: 4.92 MIL/uL (ref 3.87–5.11)
RDW: 16.8 % — ABNORMAL HIGH (ref 11.5–15.5)
WBC: 8.1 10*3/uL (ref 4.0–10.5)
nRBC: 0 % (ref 0.0–0.2)

## 2021-01-02 LAB — LIPASE, BLOOD: Lipase: 19 U/L (ref 11–51)

## 2021-01-02 LAB — HEPATIC FUNCTION PANEL
ALT: 12 U/L (ref 0–44)
AST: 18 U/L (ref 15–41)
Albumin: 4.1 g/dL (ref 3.5–5.0)
Alkaline Phosphatase: 105 U/L (ref 38–126)
Bilirubin, Direct: 0.1 mg/dL (ref 0.0–0.2)
Indirect Bilirubin: 0.4 mg/dL (ref 0.3–0.9)
Total Bilirubin: 0.5 mg/dL (ref 0.3–1.2)
Total Protein: 6.9 g/dL (ref 6.5–8.1)

## 2021-01-02 LAB — MAGNESIUM: Magnesium: 2.2 mg/dL (ref 1.7–2.4)

## 2021-01-02 LAB — ETHANOL: Alcohol, Ethyl (B): <10 mg/dL (ref <10)

## 2021-01-02 MED ORDER — OXYCODONE-ACETAMINOPHEN 5-325 MG PO TABS
1.0000 | ORAL_TABLET | Freq: Once | ORAL | Status: AC
  Administered 2021-01-02: 1 via ORAL
  Filled 2021-01-02: qty 1

## 2021-01-02 MED ORDER — BACITRACIN ZINC 500 UNIT/GM EX OINT
TOPICAL_OINTMENT | Freq: Two times a day (BID) | CUTANEOUS | Status: DC
  Administered 2021-01-02: 1 via TOPICAL
  Filled 2021-01-02: qty 0.9

## 2021-01-02 NOTE — ED Notes (Signed)
Refused urine test PA Rebekah aware.

## 2021-01-02 NOTE — ED Notes (Signed)
Pt to CT

## 2021-01-02 NOTE — ED Notes (Signed)
Pt used call bell to call out for nurse. When room entered, pt states "I dont know where my percocet bottle went". Pt states "I had a 30 tablet pill bottle of 10 mg percocets". Pts stretcher and room searched with Shirlee Limerick NT at bedside where no pill bottle was recovered. Spoke with radiology techs who state they did not notice pill bottle. Spoke with PA Kaitlyn who performed MSE in triage who reports she did not notice a pill bottle. Triage room 4 searched, no pill bottles noted. Charge RN Jenel Lucks made aware.

## 2021-01-02 NOTE — Discharge Instructions (Addendum)
You were seen in the ER today after your falls.  Your physical exam and imaging was overall very reassuring.  You do have a small fracture to one of the pieces of your vertebrae in your upper back, however fortunately this type of fracture does not require any further work-up or evaluation at it is stable. You do not have any other broken bones.  Your wounds on your arm have been dressed with antibiotic ointment.  May continue to use antibiotic ointment at home, and may follow-up with pain management clinic for refills of your pain medications.  You may take Tylenol 650 mg or your percocet every 4 hours as needed for headache. It is very important that you rest over the next 48 hours. You should also not participate in any exercise or sports for at least 7 days and until you have no symptoms including headache, dizziness, nausea, or vomiting. Once your symptoms have resolved and you have gone at least 7 days following your injury, you may gradually return to light exercise as directed by your regular primary care provider. Return to the emergency department for repeat evaluation if you develop more than 2 episodes of vomiting, worsening headache despite use of Tylenol, difficulties with speech or balance, or other new concerns.

## 2021-01-02 NOTE — ED Provider Notes (Signed)
[facility] DEPT Provider Note   CSN: [id number] Arrival date & time: 01/02/21  1344     History Chief Complaint  Patient presents with  . Fall  . Head Injury    Dorothy White is a 70 y.o. female who presents with multiple falls today.  She states that approximately 1 hour prior to leaving for the emergency department she tripped at her friend's house and face planted onto a wood floor.  She denies any LOC, blurry vision, double vision, nausea, vomiting since that time but does endorse feeling very sleepy.  She states that since that fall she has had approximately 5 more falls at home prior to arrival emergency department.  She endorses pain all over and has bruising to the face on the right side as well as significant skin tears to the left forearm.  She endorses recent surgical procedure on the left forearm to remove hardware placed in the past.  She endorses headache at this time.  Patient is very poor historian.  She ambulates independently normally, however has a cane for when she needs it.  She states she has history of recurring falls in the past, residual not followed for 6 months prior to today.  I personally reviewed this patient's medical records.  She has history of anxiety, GERD, migraines, coronary artery disease, nonischemic cardiomyopathy, TIA.  She is not on any anticoagulation.  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 HPI     Past Medical History:  Diagnosis Date  . Anemia    ?  Marland Kitchen Anxiety   . Arthritis    "everywhere" (04/25/2016)  . 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 Chronic back pain    "all over" (04/25/2016)  . Constipation   . Depression   . Diverticulitis   . GERD (gastroesophageal reflux disease)   . Head injury 2019   subdural hematoma  . Headache    "weekly" (04/25/2016)  . 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 History of blood transfusion    . HLD (hyperlipidemia)    hx (04/25/2016)  . 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 Mild CAD    a. 25% mLAD, otherwise no sig disease 01/2016 cath.  Marland Kitchen NICM (nonischemic cardiomyopathy) ([place])    a. EF 40-45% by echo 09/2990 at time of complicated migraine/neuro sx, 55-65% at time of cath 01/2016  . Stroke [facility]) 06/2013   "mini" stroke , ?possibly hemaplegic migraine per pt    Patient Active Problem List   Diagnosis Date Noted  . Femur fracture, right ([place]) 08/09/2020  . Closed comminuted fracture of proximal ulna, left, initial encounter 04/04/2020  . Protein-calorie malnutrition, severe 07/04/2018  . UTI (urinary tract infection) 07/03/2018  . Nasal bone fractures 07/03/2018  . Knee pain 07/03/2018  . Normal anion gap metabolic acidosis 42/68/3419  . Subdural hematoma ([place]) 07/02/2018  . Recurrent falls 04/16/2017  . 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 At risk for adverse drug event 04/07/2017  . Acute blood loss as cause of postoperative anemia 04/04/2017  . Acute delirium 04/04/2017  . Osteoporosis 04/02/2017  . Closed fracture of neck of left femur ([place]) 03/31/2017  . Scalp contusion 03/31/2017  . Fall at home, initial encounter 03/31/2017  . Hip fracture ([place]) 03/31/2017  . 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 Hx of transient ischemic attack (TIA) 02/19/2017  . Left carotid  stenosis- 40-59% 02/19/2017  . Protein calorie malnutrition ([place]) 02/18/2017  . Falls 02/17/2017  . Chest pain 09/26/2016  . Diverticulitis of sigmoid colon 04/25/2016  . Infection due to ESBL-producing Escherichia coli/Diverticular Abscess 03/15/2016  . Diverticulitis 03/12/2016  . Chronic pain syndrome 03/12/2016  . Lesion of pancreas 03/12/2016  . History of chest pain 03/12/2016  . Hypokalemia 03/12/2016  . Angina, class IV ([place]) - chest tightness and pressure with dyspnea with minimal exertion 02/17/2016    Class:  Question of  . TIA (transient ischemic attack) 12/26/2015  . DOE (dyspnea on exertion) 12/26/2015  . Right sided weakness 12/17/2015  . Ataxia 12/17/2015  . Shoulder fracture 02/15/2015  . Chronic headache 10/11/2013  . Weakness generalized 08/12/2013  . Dizziness and giddiness 08/12/2013  . Abnormality of gait 07/11/2013  . Hyperlipidemia 05/07/2007  . Migraine 05/07/2007  . PREMATURE VENTRICULAR CONTRACTIONS, FREQUENT 05/07/2007  . GERD (gastroesophageal reflux disease) 05/07/2007  . DIVERTICULOSIS, COLON 05/07/2007  . 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 OSTEOPENIA 05/07/2007  . Depression 03/24/2007  . HEMORRHOIDS 03/24/2007  . ALLERGIC RHINITIS 03/24/2007  . LOW BACK PAIN 03/24/2007  . MIGRAINES, HX OF 03/24/2007    Past Surgical History:  Procedure Laterality Date  . ANTERIOR APPROACH HEMI HIP ARTHROPLASTY Left 04/01/2017   Procedure: LEFT DIRECT ANTERIOR TOTAL HIP REPLACEMENT;  Surgeon: Leandrew Koyanagi, MD;  Location: [facility];  Service: Orthopedics;  Laterality: Left;  LEFT DIRECT ANTERIOR TOTAL HIP REPLACEMENT  . AUGMENTATION MAMMAPLASTY  1980  . BASAL CELL CARCINOMA EXCISION     "tip of my nose"  . BUNIONECTOMY Bilateral 10/2003  . CARDIAC CATHETERIZATION N/A 02/20/2016   Procedure: Left Heart Cath and Coronary Angiography;  Surgeon: Jettie Booze, MD;  Location: [facility];  Service: Cardiovascular;  Laterality: N/A;  . COLONOSCOPY    . DILATION AND CURETTAGE OF UTERUS    . FRACTURE SURGERY    . IR GENERIC HISTORICAL  03/18/2016   IR SINUS/FIST TUBE CHK-NON GI 03/18/2016 Aletta Edouard, MD [facility]  . LAPAROSCOPIC SIGMOID COLECTOMY N/A 04/25/2016   Procedure: LAPAROSCOPIC SIGMOID COLECTOMY;  Surgeon: Stark Klein, MD;  Location: [facility];  Service: General;  Laterality: N/A;  . OOPHORECTOMY Bilateral ~ 1999  . ORIF HUMERUS FRACTURE Right 02/15/2015   Procedure: OPEN REDUCTION INTERNAL FIXATION (ORIF) PROXIMAL HUMERUS FRACTURE;  Surgeon: Netta Cedars, MD;   Location: [facility];  Service: Orthopedics;  Laterality: Right;  . ORIF HUMERUS FRACTURE Left 04/04/2020  . ORIF HUMERUS FRACTURE Left 04/04/2020   Procedure: OPEN REDUCTION INTERNAL FIXATION (ORIF) PROXIMAL HUMERUS FRACTURE;  Surgeon: Iran Planas, MD;  Location: [facility];  Service: Orthopedics;  Laterality: Left;  regional block  . ORIF ULNAR FRACTURE Left 04/04/2020  . ORIF ULNAR FRACTURE Left 04/04/2020   Procedure: OPEN REDUCTION INTERNAL FIXATION (ORIF) ULNAR FRACTURE;  Surgeon: Iran Planas, MD;  Location: [facility];  Service: Orthopedics;  Laterality: Left;  with regional block  . RHINOPLASTY  1976  . TONSILLECTOMY    . TOTAL HIP ARTHROPLASTY Right 08/11/2020   Procedure: TOTAL HIP ARTHROPLASTY ANTERIOR APPROACH;  Surgeon: Rod Can, MD;  Location: [facility];  Service: Orthopedics;  Laterality: Right;  . TUBAL LIGATION  ~ 1983  . VAGINAL HYSTERECTOMY  ~ 1998     OB History    Gravida  0   Para  0   Term  0   Preterm  0   AB  0   Living  0     SAB  0   IAB  0   Ectopic  0   Multiple  0   Live Births  0           Family History  Problem Relation Age of Onset  . Lung cancer Mother 44  . Migraines Mother   . Heart attack Father        Vague  . Hypertension Brother   . Esophageal cancer Brother   . Diabetes Paternal Grandmother        questionable  . Colon cancer Neg Hx   . Kidney disease Neg Hx   . Liver disease Neg Hx     Social History   Tobacco Use  . Smoking status: Current Some Day Smoker    Packs/day: 0.25    Years: 34.00    Pack years: 8.50    Types: Cigarettes  . Smokeless tobacco: Never Used  Vaping Use  . Vaping Use: Never used  Substance Use Topics  . Alcohol use: Not Currently    Alcohol/week: 1.0 standard drink    Types: 1 Standard drinks or equivalent per week    Comment: occ   . Drug use: No    Types: Cocaine    Comment: Denies any drug use 02/19/18    Home Medications Prior to Admission medications   Medication Sig Start Date  End Date Taking? Authorizing Provider  calcium carbonate (OS-CAL) 600 MG TABS tablet Take 1 tablet by mouth daily.    [provider]  clonazePAM (KLONOPIN) 1 MG tablet Take 0.5 mg by mouth at bedtime as needed for anxiety.  03/21/20   [provider]  docusate sodium (COLACE) 100 MG capsule Take 1 capsule (100 mg total) by mouth 2 (two) times daily. Patient taking differently: Take 100 mg by mouth daily. 08/14/20   Shelly Coss, MD  FLUoxetine (PROZAC) 40 MG capsule Take 80 mg by mouth daily. 04/18/18   [provider]  Magnesium 500 MG TABS Take 500 mg by mouth daily.    [provider]  mometasone (NASONEX) 50 MCG/ACT nasal spray Place 2 sprays into the nose daily. 04/22/17   Medina-Vargas, Monina C, NP  Multiple Vitamin (MULTIVITAMIN WITH MINERALS) TABS tablet Take 1 tablet by mouth daily. 07/07/18   Hosie Poisson, MD  omeprazole (PRILOSEC) 20 MG capsule Take 20 mg by mouth daily.    [provider]  oxyCODONE 10 MG TABS Take 1 tablet (10 mg total) by mouth every 6 (six) hours as needed for moderate pain. Patient taking differently: Take 10 mg by mouth 5 (five) times daily. 08/14/20   Swinteck, Aaron Edelman, MD  topiramate (TOPAMAX) 100 MG tablet Take 1 tablet (100 mg total) by mouth at bedtime. 12/28/17   Kathrynn Ducking, MD  traZODone (DESYREL) 100 MG tablet Take 200 mg by mouth at bedtime. 07/16/20   [provider]  vitamin B-12 (CYANOCOBALAMIN) 1000 MCG tablet Take 1 tablet (1,000 mcg total) by mouth daily. 02/19/17   Debbe Odea, MD    Allergies    Opana [oxymorphone hcl] and Penicillins  Review of Systems   Review of Systems  Constitutional: Negative.   HENT: Negative.   Eyes: Negative.  Negative for photophobia and visual disturbance.  Cardiovascular: Negative.   Gastrointestinal: Negative.   Genitourinary: Negative.   Musculoskeletal: Positive for back pain, joint swelling and myalgias.  Skin: Positive for wound.  Neurological:  Positive for dizziness, weakness, light-headedness and headaches. Negative for tremors, seizures, syncope, speech difficulty and numbness.  Physical Exam Updated Vital Signs BP 114/69   Pulse 63   Temp 98.9 F (37.2 C) (Oral)   Resp 11   SpO2 96%   Physical Exam Vitals and nursing note reviewed.  Constitutional:      Appearance: She is cachectic. She is not toxic-appearing.  HENT:     Head: Normocephalic. Laceration present. No raccoon eyes, Battle's sign, right periorbital erythema or left periorbital erythema.     Jaw: There is normal jaw occlusion.      Right Ear: External ear normal.     Left Ear: External ear normal.     Nose: Nose normal.     Right Nostril: No septal hematoma.     Left Nostril: No septal hematoma.     Mouth/Throat:     Mouth: Mucous membranes are moist.     Pharynx: Oropharynx is clear. Uvula midline. No oropharyngeal exudate, posterior oropharyngeal erythema or uvula swelling.     Tonsils: No tonsillar exudate.  Eyes:     General: Lids are normal. Vision grossly intact.        Right eye: No discharge.        Left eye: No discharge.     Extraocular Movements: Extraocular movements intact.     Conjunctiva/sclera: Conjunctivae normal.     Pupils: Pupils are equal, round, and reactive to light.  Neck:     Trachea: Trachea and phonation normal.  Cardiovascular:     Rate and Rhythm: Normal rate and regular rhythm.     Pulses: Normal pulses.     Heart sounds: Normal heart sounds. No murmur heard.   Pulmonary:     Effort: Pulmonary effort is normal. No tachypnea, bradypnea, accessory muscle usage, prolonged expiration or respiratory distress.     Breath sounds: Normal breath sounds. No wheezing or rales.  Chest:     Chest wall: No mass, lacerations, deformity, swelling, tenderness, crepitus or edema.  Abdominal:     General: Bowel sounds are normal. There is no distension.     Palpations: Abdomen is soft.     Tenderness: There is no abdominal  tenderness. There is no right CVA tenderness, left CVA tenderness, guarding or rebound.  Musculoskeletal:        General: No deformity.     Right shoulder: Tenderness present. No swelling, deformity, effusion, laceration, bony tenderness or crepitus. Normal range of motion. Normal strength.     Left shoulder: Normal.     Right upper arm: Normal.     Left upper arm: Normal.     Right elbow: Normal.     Left elbow: No swelling, deformity or lacerations. Normal range of motion. Tenderness present.     Right forearm: Normal.     Left forearm: Normal. No swelling, edema, deformity, tenderness or bony tenderness.     Right wrist: Normal.     Left wrist: Normal.     Right hand: Normal.     Left hand: Normal.       Arms:     Cervical back: Full passive range of motion without pain, normal range of motion and neck supple. Spasms and tenderness present. No swelling, edema, deformity, erythema, signs of trauma, lacerations, rigidity or crepitus. Spinous process tenderness present. No muscular tenderness. Normal range of motion.     Thoracic back: Spasms and tenderness present. No bony tenderness.     Lumbar back: Spasms present. No swelling, deformity, tenderness or bony tenderness. Negative right straight leg raise test and negative left straight leg  raise test.     Right hip: Normal.     Left hip: Normal.     Right upper leg: Normal.     Left upper leg: Normal.     Right knee: Normal.     Left knee: Normal.     Right lower leg: Normal. No edema.     Left lower leg: Normal. No edema.     Right ankle: Normal.     Right Achilles Tendon: Normal.     Left ankle: Normal.     Left Achilles Tendon: Normal.     Right foot: Normal.     Left foot: Normal.  Lymphadenopathy:     Cervical: No cervical adenopathy.  Skin:    General: Skin is warm and dry.     Capillary Refill: Capillary refill takes less than 2 seconds.     Findings: Abrasion, bruising, signs of injury, laceration and wound present.   Neurological:     Mental Status: She is alert. Mental status is at baseline.  Psychiatric:        Mood and Affect: Mood normal.     ED Results / Procedures / Treatments   Labs (all labs ordered are listed, but only abnormal results are displayed) Labs Reviewed  CBC WITH DIFFERENTIAL/PLATELET - Abnormal; Notable for the following components:      Result Value   Hemoglobin 11.6 (*)    MCH 23.6 (*)    MCHC 28.7 (*)    RDW 16.8 (*)    All other components within normal limits  URINE CULTURE  LIPASE, BLOOD  ETHANOL  BASIC METABOLIC PANEL  HEPATIC FUNCTION PANEL  MAGNESIUM  URINALYSIS, ROUTINE W REFLEX MICROSCOPIC  RAPID URINE DRUG SCREEN, HOSP PERFORMED    EKG EKG Interpretation  Date/Time:  Wednesday January 02 2021 16:27:51 EDT Ventricular Rate:  63 PR Interval:    QRS Duration: 84 QT Interval:  473 QTC Calculation: 485 R Axis:   41 Text Interpretation: probable sinus rhythm Borderline repolarization abnormality Confirmed by Dorie Rank [phone]) on 01/02/2021 4:32:44 PM   Radiology DG Ribs Unilateral W/Chest Right  Result Date: 01/02/2021 CLINICAL DATA:  Fall today after tripping on paper. Multiple additional falls since that time. Right rib pain. EXAM: RIGHT RIBS AND CHEST - 3+ VIEW COMPARISON:  Radiograph 10/02/2020 FINDINGS: No fracture or other bone lesions are seen involving the ribs. The bones are diffusely under mineralized. Postsurgical change of both proximal humeral. There is no evidence of pneumothorax or pleural effusion. Both lungs are clear. Heart size and mediastinal contours are within normal limits. Peripherally calcified breast implants. IMPRESSION: No evidence of rib fracture or acute chest finding. Electronically Signed   By: Keith Rake M.D.   On: 01/02/2021 16:28   DG Shoulder Right  Result Date: 01/02/2021 CLINICAL DATA:  Status post fall. EXAM: RIGHT SHOULDER - 2+ VIEW COMPARISON:  None. FINDINGS: There is no evidence of an acute fracture or  dislocation. A radiopaque fixation plate and screws are seen within the proximal right humerus. Degenerative changes seen involving the right acromioclavicular joint and right glenohumeral articulation. A partially calcified right breast implant is noted. IMPRESSION: Chronic and degenerative changes without an acute osseous abnormality. Electronically Signed   By: Virgina Norfolk M.D.   On: 01/02/2021 14:58   DG Forearm Left  Result Date: 01/02/2021 CLINICAL DATA:  Fall today after tripping on paper with additional falls since that time. Skin tear to left forearm. EXAM: LEFT FOREARM - 2 VIEW COMPARISON:  Radiograph 03/29/2020  FINDINGS: Cortical margins of the radius and ulna are intact without acute fracture. There are ghost tracks in the proximal ulna from prior surgical hardware. No fracture line is visible. Bones are diffusely under mineralized. Wrist and elbow alignment are maintained. Soft tissues are unremarkable. IMPRESSION: 1. No acute fracture of the left forearm. 2. Osteopenia/osteoporosis. Electronically Signed   By: Keith Rake M.D.   On: 01/02/2021 16:29   CT Head Wo Contrast  Result Date: 01/02/2021 CLINICAL DATA:  Facial trauma status post trip and fall. EXAM: CT HEAD WITHOUT CONTRAST CT MAXILLOFACIAL WITHOUT CONTRAST CT CERVICAL SPINE WITHOUT CONTRAST TECHNIQUE: Multidetector CT imaging of the head, cervical spine, and maxillofacial structures were performed using the standard protocol without intravenous contrast. Multiplanar CT image reconstructions of the cervical spine and maxillofacial structures were also generated. COMPARISON:  CT head without contrast 12/24/2020 FINDINGS: CT HEAD FINDINGS Brain: No evidence of acute infarction, hemorrhage, hydrocephalus, extra-axial collection or mass lesion/mass effect. Vascular: No hyperdense vessel or unexpected calcification. Skull: Normal. Negative for fracture or focal lesion. Other: None. CT MAXILLOFACIAL FINDINGS Osseous: Chronic nasal  bone deformity consistent with remote fracture. Bony orbits, zygomatic arches, pterygoid plates, and mandible are intact. Orbits: Negative. No traumatic or inflammatory finding. Sinuses: There has been prior left uncinectomy. Sinuses and mastoid air cells are well aerated. Soft tissues: Right facial soft tissue swelling is seen. Metallic bb noted overlying the right zygomatic arch. CT CERVICAL SPINE FINDINGS Alignment: Normal. Skull base and vertebrae: Minimally displaced fracture of the T2 spinous process best seen on image 87 of series 6. Soft tissues and spinal canal: No prevertebral fluid or swelling. No visible canal hematoma. Disc levels:  Mild multilevel degenerative changes are seen. Upper chest: Negative. Other: None. IMPRESSION: 1. Minimally displaced fracture of the T2 spinous process. 2. No acute abnormality of the head or facial bones. Electronically Signed   By: Miachel Roux M.D.   On: 01/02/2021 15:32   CT Cervical Spine Wo Contrast  Result Date: 01/02/2021 CLINICAL DATA:  Facial trauma status post trip and fall. EXAM: CT HEAD WITHOUT CONTRAST CT MAXILLOFACIAL WITHOUT CONTRAST CT CERVICAL SPINE WITHOUT CONTRAST TECHNIQUE: Multidetector CT imaging of the head, cervical spine, and maxillofacial structures were performed using the standard protocol without intravenous contrast. Multiplanar CT image reconstructions of the cervical spine and maxillofacial structures were also generated. COMPARISON:  CT head without contrast 12/24/2020 FINDINGS: CT HEAD FINDINGS Brain: No evidence of acute infarction, hemorrhage, hydrocephalus, extra-axial collection or mass lesion/mass effect. Vascular: No hyperdense vessel or unexpected calcification. Skull: Normal. Negative for fracture or focal lesion. Other: None. CT MAXILLOFACIAL FINDINGS Osseous: Chronic nasal bone deformity consistent with remote fracture. Bony orbits, zygomatic arches, pterygoid plates, and mandible are intact. Orbits: Negative. No traumatic or  inflammatory finding. Sinuses: There has been prior left uncinectomy. Sinuses and mastoid air cells are well aerated. Soft tissues: Right facial soft tissue swelling is seen. Metallic bb noted overlying the right zygomatic arch. CT CERVICAL SPINE FINDINGS Alignment: Normal. Skull base and vertebrae: Minimally displaced fracture of the T2 spinous process best seen on image 87 of series 6. Soft tissues and spinal canal: No prevertebral fluid or swelling. No visible canal hematoma. Disc levels:  Mild multilevel degenerative changes are seen. Upper chest: Negative. Other: None. IMPRESSION: 1. Minimally displaced fracture of the T2 spinous process. 2. No acute abnormality of the head or facial bones. Electronically Signed   By: Miachel Roux M.D.   On: 01/02/2021 15:32   DG HIPS BILAT WITH PELVIS  3-4 VIEWS  Result Date: 01/02/2021 CLINICAL DATA:  Status post fall. EXAM: DG HIP (WITH OR WITHOUT PELVIS) 3-4V BILAT COMPARISON:  October 02, 2020 FINDINGS: There is no evidence of an acute hip fracture or dislocation. Bilateral total hip replacements are seen. There is no evidence of surrounding lucency to suggest the presence of hardware loosening or infection. Soft tissue structures are unremarkable. IMPRESSION: Intact bilateral total hip replacements. Electronically Signed   By: Virgina Norfolk M.D.   On: 01/02/2021 15:00   CT Maxillofacial Wo Contrast  Result Date: 01/02/2021 CLINICAL DATA:  Facial trauma status post trip and fall. EXAM: CT HEAD WITHOUT CONTRAST CT MAXILLOFACIAL WITHOUT CONTRAST CT CERVICAL SPINE WITHOUT CONTRAST TECHNIQUE: Multidetector CT imaging of the head, cervical spine, and maxillofacial structures were performed using the standard protocol without intravenous contrast. Multiplanar CT image reconstructions of the cervical spine and maxillofacial structures were also generated. COMPARISON:  CT head without contrast 12/24/2020 FINDINGS: CT HEAD FINDINGS Brain: No evidence of acute infarction,  hemorrhage, hydrocephalus, extra-axial collection or mass lesion/mass effect. Vascular: No hyperdense vessel or unexpected calcification. Skull: Normal. Negative for fracture or focal lesion. Other: None. CT MAXILLOFACIAL FINDINGS Osseous: Chronic nasal bone deformity consistent with remote fracture. Bony orbits, zygomatic arches, pterygoid plates, and mandible are intact. Orbits: Negative. No traumatic or inflammatory finding. Sinuses: There has been prior left uncinectomy. Sinuses and mastoid air cells are well aerated. Soft tissues: Right facial soft tissue swelling is seen. Metallic bb noted overlying the right zygomatic arch. CT CERVICAL SPINE FINDINGS Alignment: Normal. Skull base and vertebrae: Minimally displaced fracture of the T2 spinous process best seen on image 87 of series 6. Soft tissues and spinal canal: No prevertebral fluid or swelling. No visible canal hematoma. Disc levels:  Mild multilevel degenerative changes are seen. Upper chest: Negative. Other: None. IMPRESSION: 1. Minimally displaced fracture of the T2 spinous process. 2. No acute abnormality of the head or facial bones. Electronically Signed   By: Miachel Roux M.D.   On: 01/02/2021 15:32    Procedures Procedures   Medications Ordered in ED Medications  bacitracin ointment (1 application Topical Given 01/02/21 1820)  oxyCODONE-acetaminophen (PERCOCET/ROXICET) 5-325 MG per tablet 1 tablet (1 tablet Oral Given 01/02/21 1610)  oxyCODONE-acetaminophen (PERCOCET/ROXICET) 5-325 MG per tablet 1 tablet (1 tablet Oral Given 01/02/21 1820)    ED Course  I have reviewed the triage vital signs and the nursing notes.  Pertinent labs & imaging results that were available during my care of the patient were reviewed by me and considered in my medical decision making (see chart for details).    MDM Rules/Calculators/A&P                         70 year old female presents with concern for multiple falls today with Bruising to the face, skin  tears to the arms, and headache.  Differential diagnosis includes not limited to mechanical falls, concussion, metabolic derangement, dysrhythmia, ACS, sepsis, CVA.  Vital signs are normal intake.  Cardiopulmonary exam is normal abdominal exam is benign.  Patient with bruising and laceration to the right eyebrow as well as bruising to the right zygoma with swelling.  No intraoral injury.  Full range of motion of the neck, symmetric strength and sensation in the upper and lower extremities bilaterally.  There are skin tears to left forearm as well as recent surgical scar.  Bruising to the right shoulder.  CBC with baseline anemia with hemoglobin of 11.6, BMP unremarkable,  hepatic function panel unremarkable, lipase is normal magnesium is normal.  Urine test ordered, however patient declined to proceed with urine test.  Multiple imaging studies were obtained without evidence of acute fracture or dislocation in the left forearm, the right ribs or chest, no intracranial abnormality on the CT of the head or maxillofacial, no fracture or dislocation in the hips or pelvis or the right shoulder.  Unfortunately there was a T2 spinous process fracture identified on C-spine CT without any other acute abnormalities.  No further work-up necessary for stabilization of this injury.  Throughout the emergency department stay the patient reported to the nurse that she was missing a bottle of 35 Percocets which she is adamant she had here in the emergency department.  The RN and multiple ED staff members have explored the waiting room, the triage area the patient was then, her ED room, and the CT scanner in radiology department where she was transported without evidence of the reported missing medication.  Upon review of the PMP, patient received a 7-day supply with 35 pills of Percocet on 12/25/2020, however states that she had pills remaining she is adamant she had with her here in the emergency department.  Marla is a an  active patient at the pain clinic here in town; I informed the patient that I will not be able to provide her with a prescription as it could compromise her contract with the pain management clinic.  Though displeased with this answer, she voiced understanding.  No further work-up warranted in the emergency department this time.  Patient was able to ambulate in the emergency department with assistance of her cane, independently.  Continues to decline urine test.  Recommend close follow-up with her primary care doctor and pain management clinic for refill of her pain medications as needed.  Willodean voiced understanding of her medical evaluation and treatment plan.  Each of her questions was answered to her expressed satisfaction.  Return precautions were given.  Patient is stable and appropriate for discharge at this time.  This chart was dictated using voice recognition software, Dragon. Despite the best efforts of this provider to proofread and correct errors, errors may still occur which can change documentation meaning.  Final Clinical Impression(s) / ED Diagnoses Final diagnoses:  Fall  Fall, initial encounter    Rx / DC Orders ED Discharge Orders    None       Aura Dials 01/02/21 1831    Dorie Rank, MD 01/03/21 [phone]

## 2021-01-02 NOTE — Progress Notes (Deleted)
Assessment/Plan:   1.   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  She has had very serious consequences of falls including fractures and subdural hematoma.  Discussed again with patient that I no longer want her to be able to walk.  I need her in a wheelchair at all times.   discussed power WC. Pt needs power WC as she has difficulty getting to bathroom on time. Pt having trouble navigating getting in and out of shower and power WC would be of great benefit with this task that walker is of no benefit for.  In addition, multiple serious falls with the walker.  Pt has the mental capability to use the power WC.  Need to refer to PT for WC eval.  She cannot use manual WC because of endurance abilities to propel and poor coordination of UE's. she could not mount a scooter/transfer onto safely, hence making it inappropriate for this patient.   Rollator not appropriate as multiple falls with this and traditional walker.    -Patient met with my social worker for short period of time today, but I would like patient to meet with her more regularly for counseling.  -Discussed with patient that we are going to be restarting the atypical parkinsonian support group in person.  I think patient would do well with this.  2.  Chronic back pain  -Would really like to see the patient off of opioid medications, primarily because these also increase her risk for falls and confusion.  In addition, she is taking a combination of both oxycodone/acetaminophen as well as clonazepam, 1 mg.  She goes through 35 tablets of oxycodone/acetaminophen 10/325 per week per PDMP (140 per month).     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  My previous records were reviewed prior to todays visit as well as outside records available to me.  Last visit, the patient and I discussed power wheelchair in detail, as I told her I really  wanted her to stay seated at all times.  The patient became upset and overwhelmed with the conversation and the degenerative nature of her disease.  We decided to bring her back today to revisit the discussion in April, but she canceled the same day that she was scheduled to have that visit.  Patient has been in the emergency room again since our last visit.  She was there on April 6.  I have reviewed those records.  She apparently was having generalized weakness and was sent to the emergency room by her physical therapist.  Work-up was negative, including CT brain.  I got a call from hospice asking if she was a good hospice candidate.  I told him that I was not sure that I could answer that, as the patient had not shown up to the last visit, but also I was not sure that the patient was ready to address that, as she certainly was not on my last visit.  It turns out that the patient revoked the request for hospice and requested palliative care.   PREVIOUS MEDICATIONS: levodopa (nausea, ?diarrhea but has chronic diarrhea) ALLERGIES:   Allergies  Allergen Reactions  . Opana [Oxymorphone Hcl] Other (See Comments)    hallucinations  . Penicillins Hives    Has patient had a PCN reaction causing immediate rash, facial/tongue/throat swelling, SOB or lightheadedness with hypotension: Unknown Has patient had a PCN reaction causing severe rash involving  mucus membranes or skin necrosis: No Has patient had a PCN reaction that required hospitalization No Has patient had a PCN reaction occurring within the last 10 years: No If all of the above answers are "NO", then may proceed with Cephalosporin use.     CURRENT MEDICATIONS:  Outpatient Encounter Medications as of 01/08/2021  Medication Sig  . calcium carbonate (OS-CAL) 600 MG TABS tablet Take 1 tablet by mouth daily.  . clonazePAM (KLONOPIN) 1 MG tablet Take 0.5 mg by mouth at bedtime as needed for anxiety.   . docusate sodium (COLACE) 100 MG capsule Take  1 capsule (100 mg total) by mouth 2 (two) times daily. (Patient taking differently: Take 100 mg by mouth daily.)  . FLUoxetine (PROZAC) 40 MG capsule Take 80 mg by mouth daily.  . Magnesium 500 MG TABS Take 500 mg by mouth daily.  . mometasone (NASONEX) 50 MCG/ACT nasal spray Place 2 sprays into the nose daily.  . Multiple Vitamin (MULTIVITAMIN WITH MINERALS) TABS tablet Take 1 tablet by mouth daily.  Marland Kitchen omeprazole (PRILOSEC) 20 MG capsule Take 20 mg by mouth daily.  Marland Kitchen oxyCODONE 10 MG TABS Take 1 tablet (10 mg total) by mouth every 6 (six) hours as needed for moderate pain. (Patient taking differently: Take 10 mg by mouth 5 (five) times daily.)  . topiramate (TOPAMAX) 100 MG tablet Take 1 tablet (100 mg total) by mouth at bedtime.  . traZODone (DESYREL) 100 MG tablet Take 200 mg by mouth at bedtime.  . vitamin B-12 (CYANOCOBALAMIN) 1000 MCG tablet Take 1 tablet (1,000 mcg total) by mouth daily.   No facility-administered encounter medications on file as of 01/08/2021.    Objective:   PHYSICAL EXAMINATION:    VITALS:  There were no vitals filed for this visit.  GEN:  The patient appears stated age and is in NAD. HEENT:  Normocephalic, atraumatic.  The mucous membranes are moist. The superficial temporal arteries are without ropiness or tenderness. CV:  RRR Lungs:  CTAB Neck/HEME:  There are no carotid bruits bilaterally.  Neurological examination:  Orientation: The patient is alert and oriented x3. Cranial nerves: There is good facial symmetry with*** facial hypomimia. The speech is fluent and clear. Soft palate rises symmetrically and there is no tongue deviation. Hearing is intact to conversational tone. Sensation: Sensation is intact to light touch throughout Motor: Strength is at least antigravity x4.  Movement examination: Tone: There is ***tone in the *** Abnormal movements: *** Coordination:  There is *** decremation with RAM's, *** Gait and Station: The patient has ***  difficulty arising out of a deep-seated chair without the use of the hands. The patient's stride length is ***.  The patient has a *** pull test.     I have reviewed and interpreted the following labs independently    Chemistry      Component Value Date/Time   NA 141 12/24/2020 1530   NA 147 04/13/2017 0000   K 3.2 (L) 12/24/2020 1530   CL 108 12/24/2020 1530   CO2 26 12/24/2020 1530   BUN 16 12/24/2020 1530   BUN 11 04/13/2017 0000   CREATININE 0.60 12/24/2020 1530   CREATININE 0.95 02/15/2016 1536   GLU 93 04/13/2017 0000      Component Value Date/Time   CALCIUM 9.4 12/24/2020 1530   CALCIUM 9.4 01/13/2007 2356   ALKPHOS 109 12/24/2020 1530   AST 20 12/24/2020 1530   ALT 12 12/24/2020 1530   BILITOT 0.5 12/24/2020 1530  Lab Results  Component Value Date   WBC 5.9 12/24/2020   HGB 12.3 12/24/2020   HCT 41.7 12/24/2020   MCV 76.5 (L) 12/24/2020   PLT 314 12/24/2020    Lab Results  Component Value Date   TSH 0.720 12/17/2017     Total time spent on today's visit was ***30 minutes, including both face-to-face time and nonface-to-face time.  Time included that spent on review of records (prior notes available to me/labs/imaging if pertinent), discussing treatment and goals, answering patient's questions and coordinating care.  Cc:  Maurice Small, MD

## 2021-01-02 NOTE — ED Triage Notes (Signed)
Pt fell today, tripping on paper, she felt disoriented after the fall and has fallen several times since the initial fall. She hit her head, no loss of consciousness. No blood thinners. She also hit her left elbow, which was previously injured.

## 2021-01-02 NOTE — ED Provider Notes (Signed)
Emergency Medicine Provider Triage Evaluation Note  Dorothy White , a 70 y.o. female  was evaluated in triage.  Pt complains of mechanical fall x 1 hour prior to arrival.  Patient states she was at her friend's house and she slipped on papers on the floor.  She face planted.  She denies any loss of consciousness.  She states she felt stunned after the fall.  She was able to drive herself home and admits to falling 5 or 6 more times.  She states she has pain all over.  Not anticoagulated.  She does not typically ambulate with a cane however had 1 at home she started using it today after the falls  Review of Systems  Positive: Head injury Negative: Numbness, weakness or tingling  Physical Exam  BP 113/66 (BP Location: Left Arm)   Pulse 69   Temp 98.5 F (36.9 C) (Oral)   Resp 18   SpO2 94%  Gen:   Awake, no distress   Resp:  Normal effort  MSK:   Moves extremities without difficulty  Other:  Bruising noted to right cheek.  No pain with EOMs.  Right cheek is tender to palpation.  Dried blood on right temple with bandage in place.  Tender to palpation of right shoulder without obvious deformity.  Tender to pelvis, does not seem unstable with palpation.  Medical Decision Making  Medically screening exam initiated at 2:11 PM.  Appropriate orders placed.  Dorothy White was informed that the remainder of the evaluation will be completed by another provider, this initial triage assessment does not replace that evaluation, and the importance of remaining in the ED until their evaluation is complete.  Imaging ordered.  Patient refuses Tylenol.   Portions of this note were generated with Lobby[employer]. Dictation errors may occur despite best attempts at proofreading.    Barrie Folk, PA-C 01/02/21 1416    Dorie Rank, MD 01/03/21 [phone]

## 2021-01-02 NOTE — ED Notes (Signed)
Patient ambulated with assistance from staff. Did not report any dizziness or unsteadiness, this writer also did not observe any issues. Pt's only complaint is of soreness from her fall.

## 2021-01-08 ENCOUNTER — Ambulatory Visit: Payer: Medicare Other | Admitting: Neurology

## 2021-01-12 ENCOUNTER — Encounter (HOSPITAL_COMMUNITY): Payer: Self-pay

## 2021-01-12 ENCOUNTER — Emergency Department (HOSPITAL_COMMUNITY): Payer: Medicare Other

## 2021-01-12 ENCOUNTER — Other Ambulatory Visit: Payer: Self-pay

## 2021-01-12 ENCOUNTER — Emergency Department (HOSPITAL_COMMUNITY)
Admission: EM | Admit: 2021-01-12 | Discharge: 2021-01-12 | Disposition: A | Discharge status: Home / Self Care | Payer: Medicare Other | Attending: Emergency Medicine | Admitting: Emergency Medicine

## 2021-01-12 DIAGNOSIS — G319 Degenerative disease of nervous system, unspecified: Secondary | ICD-10-CM | POA: Diagnosis not present

## 2021-01-12 DIAGNOSIS — S0993XA Unspecified injury of face, initial encounter: Secondary | ICD-10-CM | POA: Diagnosis not present

## 2021-01-12 DIAGNOSIS — W1830XA Fall on same level, unspecified, initial encounter: Secondary | ICD-10-CM | POA: Diagnosis not present

## 2021-01-12 DIAGNOSIS — R918 Other nonspecific abnormal finding of lung field: Secondary | ICD-10-CM | POA: Diagnosis not present

## 2021-01-12 DIAGNOSIS — E43 Unspecified severe protein-calorie malnutrition: Secondary | ICD-10-CM | POA: Diagnosis not present

## 2021-01-12 DIAGNOSIS — G903 Multi-system degeneration of the autonomic nervous system: Secondary | ICD-10-CM | POA: Diagnosis not present

## 2021-01-12 DIAGNOSIS — G232 Striatonigral degeneration: Secondary | ICD-10-CM | POA: Insufficient documentation

## 2021-01-12 DIAGNOSIS — J189 Pneumonia, unspecified organism: Secondary | ICD-10-CM

## 2021-01-12 DIAGNOSIS — J181 Lobar pneumonia, unspecified organism: Secondary | ICD-10-CM | POA: Diagnosis not present

## 2021-01-12 DIAGNOSIS — Z20822 Contact with and (suspected) exposure to covid-19: Secondary | ICD-10-CM | POA: Diagnosis not present

## 2021-01-12 DIAGNOSIS — I4891 Unspecified atrial fibrillation: Secondary | ICD-10-CM | POA: Diagnosis not present

## 2021-01-12 DIAGNOSIS — S0511XA Contusion of eyeball and orbital tissues, right eye, initial encounter: Secondary | ICD-10-CM | POA: Diagnosis not present

## 2021-01-12 DIAGNOSIS — Z96641 Presence of right artificial hip joint: Secondary | ICD-10-CM | POA: Diagnosis not present

## 2021-01-12 DIAGNOSIS — I2510 Atherosclerotic heart disease of native coronary artery without angina pectoris: Secondary | ICD-10-CM | POA: Insufficient documentation

## 2021-01-12 DIAGNOSIS — F17210 Nicotine dependence, cigarettes, uncomplicated: Secondary | ICD-10-CM | POA: Diagnosis not present

## 2021-01-12 DIAGNOSIS — S5011XA Contusion of right forearm, initial encounter: Secondary | ICD-10-CM | POA: Diagnosis not present

## 2021-01-12 DIAGNOSIS — S299XXA Unspecified injury of thorax, initial encounter: Secondary | ICD-10-CM | POA: Diagnosis present

## 2021-01-12 DIAGNOSIS — M7989 Other specified soft tissue disorders: Secondary | ICD-10-CM | POA: Diagnosis not present

## 2021-01-12 DIAGNOSIS — S2241XA Multiple fractures of ribs, right side, initial encounter for closed fracture: Secondary | ICD-10-CM | POA: Diagnosis not present

## 2021-01-12 DIAGNOSIS — S022XXA Fracture of nasal bones, initial encounter for closed fracture: Secondary | ICD-10-CM | POA: Diagnosis not present

## 2021-01-12 LAB — COMPREHENSIVE METABOLIC PANEL
ALT: 8 U/L (ref 0–44)
AST: 12 U/L — ABNORMAL LOW (ref 15–41)
Albumin: 3.2 g/dL — ABNORMAL LOW (ref 3.5–5.0)
Alkaline Phosphatase: 103 U/L (ref 38–126)
Anion gap: 12 (ref 5–15)
BUN: 15 mg/dL (ref 8–23)
CO2: 19 mmol/L — ABNORMAL LOW (ref 22–32)
Calcium: 9 mg/dL (ref 8.9–10.3)
Chloride: 108 mmol/L (ref 98–111)
Creatinine, Ser: 0.61 mg/dL (ref 0.44–1.00)
GFR, Estimated: >60 mL/min (ref >60)
Glucose, Bld: 136 mg/dL — ABNORMAL HIGH (ref 70–99)
Potassium: 3.6 mmol/L (ref 3.5–5.1)
Sodium: 139 mmol/L (ref 135–145)
Total Bilirubin: 1.1 mg/dL (ref 0.3–1.2)
Total Protein: 6.2 g/dL — ABNORMAL LOW (ref 6.5–8.1)

## 2021-01-12 LAB — CBC WITH DIFFERENTIAL/PLATELET
Abs Immature Granulocytes: 0.03 10*3/uL (ref 0.00–0.07)
Basophils Absolute: 0 10*3/uL (ref 0.0–0.1)
Basophils Relative: 0 %
Eosinophils Absolute: 0 10*3/uL (ref 0.0–0.5)
Eosinophils Relative: 0 %
HCT: 39.3 % (ref 36.0–46.0)
Hemoglobin: 11.5 g/dL — ABNORMAL LOW (ref 12.0–15.0)
Immature Granulocytes: 0 %
Lymphocytes Relative: 9 %
Lymphs Abs: 0.6 10*3/uL — ABNORMAL LOW (ref 0.7–4.0)
MCH: 23.8 pg — ABNORMAL LOW (ref 26.0–34.0)
MCHC: 29.3 g/dL — ABNORMAL LOW (ref 30.0–36.0)
MCV: 81.4 fL (ref 80.0–100.0)
Monocytes Absolute: 1 10*3/uL (ref 0.1–1.0)
Monocytes Relative: 14 %
Neutro Abs: 5.5 10*3/uL (ref 1.7–7.7)
Neutrophils Relative %: 77 %
Platelets: 251 10*3/uL (ref 150–400)
RBC: 4.83 MIL/uL (ref 3.87–5.11)
RDW: 19.2 % — ABNORMAL HIGH (ref 11.5–15.5)
WBC: 7.1 10*3/uL (ref 4.0–10.5)
nRBC: 0 % (ref 0.0–0.2)

## 2021-01-12 LAB — RESP PANEL BY RT-PCR (FLU A&B, COVID) ARPGX2
Influenza A by PCR: NEGATIVE
Influenza B by PCR: NEGATIVE
SARS Coronavirus 2 by RT PCR: NEGATIVE

## 2021-01-12 LAB — TROPONIN I (HIGH SENSITIVITY)
Troponin I (High Sensitivity): 5 ng/L (ref <18)
Troponin I (High Sensitivity): 6 ng/L (ref <18)

## 2021-01-12 MED ORDER — OXYCODONE HCL 5 MG PO TABS: 5.0000 mg | ORAL_TABLET | Freq: Four times a day (QID) | ORAL | 0 refills | Status: AC | PRN

## 2021-01-12 MED ORDER — DOXYCYCLINE HYCLATE 100 MG PO TABS
100.0000 mg | ORAL_TABLET | Freq: Once | ORAL | Status: AC
  Administered 2021-01-12: 100 mg via ORAL
  Filled 2021-01-12: qty 1

## 2021-01-12 MED ORDER — DOXYCYCLINE HYCLATE 100 MG PO CAPS: 100.0000 mg | ORAL_CAPSULE | Freq: Two times a day (BID) | ORAL | 0 refills | Status: DC

## 2021-01-12 MED ORDER — OXYCODONE HCL 5 MG PO TABS
5.0000 mg | ORAL_TABLET | Freq: Once | ORAL | Status: AC
Start: 2021-01-12 — End: 2021-01-12
  Administered 2021-01-12: 5 mg via ORAL
  Filled 2021-01-12: qty 1

## 2021-01-12 NOTE — ED Triage Notes (Signed)
Patient complains of cough x 2 days. Complains of general aching with fever for same. Not vaccinated and smoker. Patient alert and oriented

## 2021-01-12 NOTE — ED Provider Notes (Signed)
Emergency Medicine Provider Triage Evaluation Note  Dorothy White , a 70 y.o. female  was evaluated in triage.  Pt complains of dry cough for the past 4 days with post tussive emesis and SOB. Pt also reports fevers 100.8. She believes she may have pneumonia although she has never had it. She complains of body aches as well. Pt presents with old bruising to her right periorbital area; states she fell a couple of days before her cough started and hit her head. Unsure LOC. IS not anticoagulated. Also complains of right rib pain and is concerned she may have broken her ribs.   Review of Systems  Positive: + fevers, cough, SOB, post tussive emesis Negative: - abdominal pain  Physical Exam  BP (!) 117/46 (BP Location: Left Arm)   Pulse 90   Temp 98.4 F (36.9 C) (Oral)   Resp 20   SpO2 94%  Gen:   Awake, no distress   Resp:  Normal effort  MSK:   Moves extremities without difficulty. + TTP And crepitus to right lateral ribs. + periorbital ecchymosis that appears old on right side with TTP. No abdominal TTP.   Medical Decision Making  Medically screening exam initiated at 1:55 PM.  Appropriate orders placed.  Dorothy White was informed that the remainder of the evaluation will be completed by another provider, this initial triage assessment does not replace that evaluation, and the importance of remaining in the ED until their evaluation is complete.     Eustaquio Maize, PA-C 01/12/21 1357    Arnaldo Natal, MD 01/13/21 [phone]

## 2021-01-12 NOTE — ED Provider Notes (Signed)
[facility] EMERGENCY DEPARTMENT Provider Note   CSN: [id number] Arrival date & time: 01/12/21  1337     History No chief complaint on file.   Dorothy White is a 70 y.o. female.  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  She lives with female friends who assist her.  She states that she falls very frequently, and she thinks she fell about 5 days ago injuring her face and her right chest wall.  2 days ago, she developed symptoms of pneumonia: Fever, productive cough, and malaise.  She reports no sick contacts.  She is having trouble taking a deep breath due to the pain.  She appears quite frail, and I asked her furthermore about her living situation and her mood.  She states that she is not exposed to any violence at home.  She ambulates with a walker.  She admits to feeling quite depressed, and she wishes that she would just die.  However, she is not actively suicidal.  She was equivocal about obtaining mental health care.  She does have a primary care doctor who follows her, and she has recently been seen by palliative care.   The history is provided by the patient.  Cough Cough characteristics:  Productive Sputum characteristics:  Green Severity:  Moderate Onset quality:  Gradual Duration:  2 days Timing:  Constant Progression:  Worsening Chronicity:  New Smoker: yes   Context: not sick contacts and not upper respiratory infection   Relieved by:  Nothing Worsened by:  Nothing Ineffective treatments:  None tried Associated symptoms: chest pain (from a fall with broken ribs), fever (subjective) and weight loss   Associated symptoms: no chills, no ear pain, no rash, no shortness of breath and no sore throat       Past Medical History:  Diagnosis Date   Anemia    ?   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 25% mLAD, otherwise no sig disease 01/2016 cath.   NICM (nonischemic cardiomyopathy) ([place])    a. 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   Femur fracture, right ([place]) 08/09/2020   Closed comminuted fracture of proximal ulna, left, initial encounter 04/04/2020   Protein-calorie malnutrition, severe 07/04/2018   UTI (urinary tract infection) 07/03/2018   Nasal bone fractures 07/03/2018   Knee pain 07/03/2018   Normal anion gap metabolic acidosis 17/61/6073   Subdural hematoma ([place]) 07/02/2018   Recurrent falls 04/16/2017   History of total hip replacement, 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   At risk for adverse drug event 04/07/2017   Acute blood loss as cause of postoperative anemia 04/04/2017   Acute  delirium 04/04/2017   Osteoporosis 04/02/2017   Closed fracture of neck of left femur ([place]) 03/31/2017   Scalp contusion 03/31/2017   Fall at home, initial encounter 03/31/2017   Hip fracture ([place]) 03/31/2017   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   Hx of transient ischemic attack (TIA) 02/19/2017   Left carotid stenosis- 40-59% 02/19/2017   Protein calorie malnutrition ([place]) 02/18/2017   Falls 02/17/2017   Chest pain 09/26/2016   Diverticulitis of sigmoid colon 04/25/2016   Infection due to ESBL-producing Escherichia coli/Diverticular Abscess 03/15/2016   Diverticulitis 03/12/2016   Chronic pain syndrome 03/12/2016    Lesion of pancreas 03/12/2016   History of chest pain 03/12/2016   Hypokalemia 03/12/2016   Angina, 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[place]) - chest tightness and pressure with dyspnea with minimal exertion 02/17/2016    Class: Question of   TIA (transient ischemic attack) 12/26/2015   DOE (dyspnea on exertion) 12/26/2015   Right sided weakness 12/17/2015   Ataxia 12/17/2015   Shoulder fracture 02/15/2015   Chronic headache 10/11/2013   Weakness generalized 08/12/2013   Dizziness and giddiness 08/12/2013   Abnormality of gait 07/11/2013   Hyperlipidemia 05/07/2007   Migraine 05/07/2007   PREMATURE VENTRICULAR CONTRACTIONS, FREQUENT 05/07/2007   GERD (gastroesophageal reflux disease) 05/07/2007   DIVERTICULOSIS, COLON 05/07/2007   DEGENERATION, CERVICAL DISC 05/07/2007   OSTEOPENIA 05/07/2007   Depression 03/24/2007   HEMORRHOIDS 03/24/2007   ALLERGIC RHINITIS 03/24/2007   LOW BACK PAIN 03/24/2007   MIGRAINES, HX OF 03/24/2007    Past Surgical History:  Procedure Laterality Date   ANTERIOR APPROACH HEMI HIP ARTHROPLASTY Left 04/01/2017   Procedure: LEFT DIRECT ANTERIOR TOTAL HIP REPLACEMENT;  Surgeon: Leandrew Koyanagi, MD;  Location: [place] [facility] [place];  Service: Orthopedics;  Laterality: Left;  LEFT DIRECT ANTERIOR TOTAL HIP REPLACEMENT   AUGMENTATION MAMMAPLASTY  1980   BASAL CELL CARCINOMA EXCISION     "tip of my nose"   BUNIONECTOMY Bilateral 10/2003   CARDIAC CATHETERIZATION N/A 02/20/2016   Procedure: Left Heart Cath and Coronary Angiography;  Surgeon: Jettie Booze, MD;  Location: [place] [facility];  Service: Cardiovascular;  Laterality: N/A;   COLONOSCOPY     DILATION AND CURETTAGE OF UTERUS     FRACTURE SURGERY     IR GENERIC HISTORICAL  03/18/2016   IR SINUS/FIST TUBE CHK-NON GI 03/18/2016 Aletta Edouard, MD [facility] RAD   LAPAROSCOPIC SIGMOID COLECTOMY N/A 04/25/2016   Procedure: LAPAROSCOPIC SIGMOID COLECTOMY;  Surgeon: Stark Klein, MD;  Location: [place];  Service: General;  Laterality: N/A;    OOPHORECTOMY Bilateral ~ 1999   ORIF HUMERUS FRACTURE Right 02/15/2015   Procedure: OPEN REDUCTION INTERNAL FIXATION (ORIF) PROXIMAL HUMERUS FRACTURE;  Surgeon: Netta Cedars, MD;  Location: [place];  Service: Orthopedics;  Laterality: Right;   ORIF HUMERUS FRACTURE Left 04/04/2020   ORIF HUMERUS FRACTURE Left 04/04/2020   Procedure: OPEN REDUCTION INTERNAL FIXATION (ORIF) PROXIMAL HUMERUS FRACTURE;  Surgeon: Iran Planas, MD;  Location: [place];  Service: Orthopedics;  Laterality: Left;  regional block   ORIF ULNAR FRACTURE Left 04/04/2020   ORIF ULNAR FRACTURE Left 04/04/2020   Procedure: OPEN REDUCTION INTERNAL FIXATION (ORIF) ULNAR FRACTURE;  Surgeon: Iran Planas, MD;  Location: [place];  Service: Orthopedics;  Laterality: Left;  with regional block   Jay Right 08/11/2020   Procedure: TOTAL HIP ARTHROPLASTY ANTERIOR APPROACH;  Surgeon: Rod Can, MD;  Location: [facility];  Service: Orthopedics;  Laterality: Right;   TUBAL  LIGATION  ~ 1983   VAGINAL HYSTERECTOMY  ~ 1998     OB History     Gravida  0   Para  0   Term  0   Preterm  0   AB  0   Living  0      SAB  0   IAB  0   Ectopic  0   Multiple  0   Live Births  0           Family History  Problem Relation Age of Onset   Lung cancer Mother 98   Migraines Mother    Heart attack Father        Vague   Hypertension Brother    Esophageal cancer Brother    Diabetes Paternal Grandmother        questionable   Colon cancer Neg Hx    Kidney disease Neg Hx    Liver disease Neg Hx     Social History   Tobacco Use   Smoking status: Some Days    Packs/day: 0.25    Years: 34.00    Pack years: 8.50    Types: Cigarettes   Smokeless tobacco: Never  Vaping Use   Vaping Use: Never used  Substance Use Topics   Alcohol use: Not Currently    Alcohol/week: 1.0 standard drink    Types: 1 Standard drinks or equivalent per week    Comment: occ    Drug use: No     Types: Cocaine    Comment: Denies any drug use 02/19/18    Home Medications Prior to Admission medications   Medication Sig Start Date End Date Taking? Authorizing Provider  clonazePAM (KLONOPIN) 1 MG tablet Take 1 mg by mouth at bedtime as needed for anxiety. 03/21/20  Yes [provider]  docusate sodium (COLACE) 100 MG capsule Take 1 capsule (100 mg total) by mouth 2 (two) times daily. Patient taking differently: Take 100 mg by mouth daily as needed for mild constipation. 08/14/20  Yes Shelly Coss, MD  FLUoxetine (PROZAC) 40 MG capsule Take 80 mg by mouth daily. 04/18/18  Yes [provider]  gabapentin (NEURONTIN) 100 MG capsule Take 200 mg by mouth 2 (two) times daily.   Yes [provider]  omeprazole (PRILOSEC) 20 MG capsule Take 20 mg by mouth daily.   Yes [provider]  oxyCODONE-acetaminophen (PERCOCET) 10-325 MG tablet Take 1 tablet by mouth 5 (five) times daily as needed for pain. 01/08/21  Yes [provider]  SUMAtriptan (IMITREX) 100 MG tablet Take 50-100 mg by mouth 2 (two) times daily as needed for migraine. 12/24/18  Yes [provider]  topiramate (TOPAMAX) 100 MG tablet Take 1 tablet (100 mg total) by mouth at bedtime. 12/28/17  Yes Kathrynn Ducking, MD  traZODone (DESYREL) 100 MG tablet Take 200 mg by mouth at bedtime. 07/16/20  Yes [provider]    Allergies    Opana [oxymorphone hcl] and Penicillins  Review of Systems   Review of Systems  Constitutional:  Positive for appetite change, fever (subjective) and weight loss. Negative for chills.  HENT:  Negative for ear pain and sore throat.   Eyes:  Negative for pain and visual disturbance.  Respiratory:  Positive for cough. Negative for shortness of breath.   Cardiovascular:  Positive for chest pain (from a fall with broken ribs). Negative for palpitations.  Gastrointestinal:  Negative for abdominal pain and vomiting.  Genitourinary:  Negative for  dysuria  and hematuria.  Musculoskeletal:  Negative for arthralgias and back pain.  Skin:  Negative for color change and rash.  Neurological:  Negative for seizures and syncope.  Psychiatric/Behavioral:  Positive for dysphoric mood. Negative for sleep disturbance and suicidal ideas.   All other systems reviewed and are negative.  Physical Exam Updated Vital Signs BP 136/73 (BP Location: Right Arm)   Pulse 82   Temp 98.7 F (37.1 C) (Oral)   Resp 18   SpO2 96%   Physical Exam Vitals and nursing note reviewed.  Constitutional:      Appearance: She is ill-appearing.     Comments: cachectic  Eyes:     Comments: Bruising around the right orbit; appears old, is resolving  Cardiovascular:     Rate and Rhythm: Normal rate and regular rhythm.     Comments: Tender to palpation over the right anterior rib cage Pulmonary:     Effort: Pulmonary effort is normal. No respiratory distress.     Breath sounds: No wheezing or rhonchi.  Abdominal:     General: There is no distension.     Palpations: There is no mass.     Tenderness: There is no abdominal tenderness.  Musculoskeletal:        General: No deformity or signs of injury.  Skin:    Comments: Bruising over right forearm, right lower legs  Neurological:     Mental Status: She is oriented to person, place, and time. Mental status is at baseline.  Psychiatric:        Behavior: Behavior normal.    ED Results / Procedures / Treatments   Labs (all labs ordered are listed, but only abnormal results are displayed) Labs Reviewed  CBC WITH DIFFERENTIAL/PLATELET - Abnormal; Notable for the following components:      Result Value   Hemoglobin 11.5 (*)    MCH 23.8 (*)    MCHC 29.3 (*)    RDW 19.2 (*)    Lymphs Abs 0.6 (*)    All other components within normal limits  COMPREHENSIVE METABOLIC PANEL - Abnormal; Notable for the following components:   CO2 19 (*)    Glucose, Bld 136 (*)    Total Protein 6.2 (*)    Albumin 3.2 (*)    AST  12 (*)    All other components within normal limits  RESP PANEL BY RT-PCR (FLU A&B, COVID) ARPGX2  TROPONIN I (HIGH SENSITIVITY)  TROPONIN I (HIGH SENSITIVITY)    EKG EKG Interpretation  Date/Time:  Saturday January 12 2021 14:04:36 EDT Ventricular Rate:  96 PR Interval:    QRS Duration: 76 QT Interval:  346 QTC Calculation: 437 R Axis:   76 Text Interpretation: Atrial fibrillation ST changes noted and similar to prior Abnormal ECG Confirmed by Nanda Quinton [phone]) on 01/12/2021 2:10:05 PM  Radiology DG Ribs Unilateral W/Chest Right  Result Date: 01/12/2021 CLINICAL DATA:  Rib pain post fall. EXAM: RIGHT RIBS AND CHEST - 3+ VIEW COMPARISON:  Chest radiograph January 02, 2021 FINDINGS: Minimally displaced fractures of the right eighth and ninth posterolateral ribs. No evidence of pneumothorax. Mild streaky opacities in the right lung base may represent atelectasis. No evidence of lobar consolidation. IMPRESSION: 1. Minimally displaced fractures of the right eighth and ninth posterolateral ribs. 2. No evidence of pneumothorax. 3. Mild streaky opacities in the right lung base may represent atelectasis. Electronically Signed   By: Fidela Salisbury M.D.   On: 01/12/2021 15:12   CT Head Wo Contrast  Result Date:  01/12/2021 CLINICAL DATA:  Fall, head/face injury. EXAM: CT HEAD WITHOUT CONTRAST CT MAXILLOFACIAL WITHOUT CONTRAST TECHNIQUE: Multidetector CT imaging of the head and maxillofacial structures were performed using the standard protocol without intravenous contrast. Multiplanar CT image reconstructions of the maxillofacial structures were also generated. COMPARISON:  CT head/maxillofacial 01/02/2021. prior head CT 12/24/2020. FINDINGS: CT HEAD FINDINGS Brain: Mild generalized cerebral and cerebellar atrophy. There is no acute intracranial hemorrhage. No demarcated cortical infarct. No extra-axial fluid collection. No evidence of intracranial mass. No midline shift. Vascular: No hyperdense  vessel.  Atherosclerotic calcifications. Skull: Normal. Negative for fracture or focal lesion. Other: No significant mastoid effusion at the imaged levels. CT MAXILLOFACIAL FINDINGS Osseous: Redemonstrated chronic fracture deformities of the bilateral nasal bones. No acute maxillofacial fracture is identified. Orbits: No acute finding within the orbits. The globes are normal in size and contour. The extraocular muscles and optic nerve sheath complexes are symmetric and unremarkable. Sinuses: Mild bilateral ethmoid and maxillary sinus mucosal thickening. Soft tissues: Right periorbital/maxillofacial soft tissue swelling. IMPRESSION: CT head: 1. No evidence of acute intracranial abnormality. 2. Mild generalized parenchymal atrophy. CT maxillofacial: 1. No evidence of acute maxillofacial fracture. 2. Redemonstrated chronic fracture deformities of the bilateral nasal bones. 3. Right periorbital/maxillofacial soft tissue swelling. 4. Mild bilateral ethmoid and maxillary sinus mucosal thickening. Electronically Signed   By: Kellie Simmering DO   On: 01/12/2021 14:37   CT Maxillofacial Wo Contrast  Result Date: 01/12/2021 CLINICAL DATA:  Fall, head/face injury. EXAM: CT HEAD WITHOUT CONTRAST CT MAXILLOFACIAL WITHOUT CONTRAST TECHNIQUE: Multidetector CT imaging of the head and maxillofacial structures were performed using the standard protocol without intravenous contrast. Multiplanar CT image reconstructions of the maxillofacial structures were also generated. COMPARISON:  CT head/maxillofacial 01/02/2021. prior head CT 12/24/2020. FINDINGS: CT HEAD FINDINGS Brain: Mild generalized cerebral and cerebellar atrophy. There is no acute intracranial hemorrhage. No demarcated cortical infarct. No extra-axial fluid collection. No evidence of intracranial mass. No midline shift. Vascular: No hyperdense vessel.  Atherosclerotic calcifications. Skull: Normal. Negative for fracture or focal lesion. Other: No significant mastoid  effusion at the imaged levels. CT MAXILLOFACIAL FINDINGS Osseous: Redemonstrated chronic fracture deformities of the bilateral nasal bones. No acute maxillofacial fracture is identified. Orbits: No acute finding within the orbits. The globes are normal in size and contour. The extraocular muscles and optic nerve sheath complexes are symmetric and unremarkable. Sinuses: Mild bilateral ethmoid and maxillary sinus mucosal thickening. Soft tissues: Right periorbital/maxillofacial soft tissue swelling. IMPRESSION: CT head: 1. No evidence of acute intracranial abnormality. 2. Mild generalized parenchymal atrophy. CT maxillofacial: 1. No evidence of acute maxillofacial fracture. 2. Redemonstrated chronic fracture deformities of the bilateral nasal bones. 3. Right periorbital/maxillofacial soft tissue swelling. 4. Mild bilateral ethmoid and maxillary sinus mucosal thickening. Electronically Signed   By: Kellie Simmering DO   On: 01/12/2021 14:37    Procedures Procedures   Medications Ordered in ED Medications  doxycycline (VIBRA-TABS) tablet 100 mg (has no administration in time range)  oxyCODONE (Oxy IR/ROXICODONE) immediate release tablet 5 mg (has no administration in time range)    ED Course  I have reviewed the triage vital signs and the nursing notes.  Pertinent labs & imaging results that were available during my care of the patient were reviewed by me and considered in my medical decision making (see chart for details).    MDM Rules/Calculators/A&P  [name] to the hospital complaining of cough.  She has had subjective fevers, and she has a productive cough.  She had a recent fall, and x-rays reveal that she has sustained 2 right-sided rib fractures.  Chest x-ray shows atelectasis in the right lung.  Clinically, I think this is suspicious for pneumonia.  She is quite frail, and I think the risk/benefit ratio is in favor of treating her for pneumonia.  I did delve  into her struggles with her chronic illness as well as her frailty.  I encouraged her to follow-up with her primary care doctor to assess her progress.  While she did endorse depression, she did not endorse suicidality.  She is connected with palliative care, and I encouraged her to seek mental health care as well. 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

## 2021-01-15 ENCOUNTER — Other Ambulatory Visit: Payer: Self-pay | Admitting: Neurology

## 2021-01-22 ENCOUNTER — Other Ambulatory Visit: Payer: Self-pay

## 2021-01-22 ENCOUNTER — Telehealth: Payer: Self-pay | Admitting: Nurse Practitioner

## 2021-01-22 ENCOUNTER — Other Ambulatory Visit: Payer: Medicare Other | Admitting: Nurse Practitioner

## 2021-03-07 ENCOUNTER — Other Ambulatory Visit: Payer: Self-pay

## 2021-03-08 ENCOUNTER — Other Ambulatory Visit: Payer: Self-pay | Admitting: *Deleted

## 2021-03-08 NOTE — Patient Outreach (Signed)
[facility]) Care Management  03/08/2021  Dorothy White May 24, 1951 [id number]   [facility] outreach to [place] EMMI referred patient  Dorothy White was referred to [facility] on 03/07/21 after a [facility] patient engagement score of [place] care ([place]) and medicaid of [place]   [facility] Unsuccessful outreach   Outreach attempt to the home number  No answer. [facility] RN CM left HIPAA [facility] Portability and Accountability Act) compliant voicemail message along with CM's contact info.   Plan: [facility] RN CM scheduled this patient for another call attempt within 4-7 business days Unsuccessful outreach letter sent on 03/08/21 Unsuccessful outreach on 03/08/21   Joelene Millin L. Lavina Hamman, RN, BSN, [place] Coordinator Office number [phone] Mobile number [phone]  Main [facility] number [phone] Fax number [phone]

## 2021-03-13 DIAGNOSIS — Z79899 Other long term (current) drug therapy: Secondary | ICD-10-CM | POA: Diagnosis not present

## 2021-03-13 DIAGNOSIS — R52 Pain, unspecified: Secondary | ICD-10-CM | POA: Diagnosis not present

## 2021-03-13 DIAGNOSIS — M5416 Radiculopathy, lumbar region: Secondary | ICD-10-CM | POA: Diagnosis not present

## 2021-03-15 ENCOUNTER — Other Ambulatory Visit: Payer: Self-pay

## 2021-03-15 ENCOUNTER — Other Ambulatory Visit: Payer: Self-pay | Admitting: *Deleted

## 2021-03-15 ENCOUNTER — Encounter: Payer: Self-pay | Admitting: *Deleted

## 2021-03-15 NOTE — Patient Outreach (Addendum)
[facility]) Care Management  03/15/2021  Dorothy White [birth year] [id number]   [facility] outreach to Join EMMI referred patient   Dorothy White was referred to [facility] on 03/07/21 after a [facility] patient engagement score of 9 with [facility] CMA This screening initiated the concern for transportation to medical appointments, help with driving for shopping and errands, falls and memory concerns    [place] care [facility]) [employer] and [place] of [place]  Outreached to [place] 5333 and a female answered He informed RN CM Dorothy White was at her home number [phone] During this outreach the female had phone connectivity concerns Call dropped   Dorothy White returned a call to RN CM from [phone] She was able to verify the HIPAA identifiers  Initial screening  RN CM reviewed the EMMI join results with Dorothy White  She confirms at times she is not able to recall well as 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) "Sometimes forgets" She is appreciative of the [facility] follow up [facility] services discussed  Transportation concern Dorothy White reports she can drive and has a car but at times will need assist with being driven or transportation to medical appointments She was not aware of her access to medical transportation she has a benefit via [place] care [facility]) nor medicaid She agreed to a Management consultant with RN CM to gather benefit information for alternative medical transportation if needed in the future   Care coordination Outreach to  [employer] care [facility]) Transportation with patient Spoke with Vela Prose (updated address) and was transferred to customer service. 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   Femur  fracture, right ([place]) 08/09/2020   Pain in joint of left elbow 04/25/2020   Encounter for orthopedic follow-up care 04/19/2020   Closed comminuted fracture of proximal ulna, left, initial encounter 04/04/2020   Closed fracture of base of fifth metacarpal bone of left hand 03/29/2020   Closed fracture of proximal end of left humerus 03/29/2020   Protein-calorie malnutrition, severe 07/04/2018   UTI (urinary tract infection) 07/03/2018   Nasal bone fractures 07/03/2018   Knee pain 07/03/2018   Normal anion gap metabolic acidosis [id number]   Subdural hematoma ([place]) 07/02/2018   Long-term current use of opiate analgesic 09/08/2017   Recurrent falls 04/16/2017   History of total hip replacement, 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   History of 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 joint 04/16/2017   At risk for adverse drug event 04/07/2017   Acute blood loss as cause of postoperative anemia 04/04/2017   Acute delirium 04/04/2017   Osteoporosis 04/02/2017   Closed fracture of neck of left femur ([place]) 03/31/2017   Scalp contusion 03/31/2017   Fall at home, initial encounter 03/31/2017   Hip fracture ([place]) 03/31/2017   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   Hx of transient ischemic attack (TIA) 02/19/2017   Left carotid stenosis- 40-59% 02/19/2017   Protein calorie malnutrition ([place]) 02/18/2017   Falls 02/17/2017   Chest pain 09/26/2016   Diverticulitis of sigmoid colon 04/25/2016   Infection due to ESBL-producing Escherichia coli/Diverticular Abscess 03/15/2016   History of infection due to ESBL Escherichia coli 03/15/2016   Diverticulitis 03/12/2016   Chronic pain  syndrome 03/12/2016   Lesion of pancreas 03/12/2016   History of chest pain 03/12/2016   Hypokalemia 03/12/2016   Angina, class IV (HCC) - chest tightness and pressure with dyspnea with minimal exertion 02/17/2016   TIA (transient ischemic attack) 12/26/2015   DOE (dyspnea on exertion) 12/26/2015   Right sided weakness 12/17/2015   Ataxia  12/17/2015   Shoulder fracture 02/15/2015   Chronic headache 10/11/2013   Weakness generalized 08/12/2013   Dizziness and giddiness 08/12/2013   Abnormality of gait 07/11/2013   Hyperlipidemia 05/07/2007   Migraine 05/07/2007   PREMATURE VENTRICULAR CONTRACTIONS, FREQUENT 05/07/2007   GERD (gastroesophageal reflux disease) 05/07/2007   DIVERTICULOSIS, COLON 05/07/2007   DEGENERATION, CERVICAL DISC 05/07/2007   OSTEOPENIA 05/07/2007   Depression 03/24/2007   HEMORRHOIDS 03/24/2007   ALLERGIC RHINITIS 03/24/2007   LOW BACK PAIN 03/24/2007   MIGRAINES, HX OF 03/24/2007   Current Outpatient Medications on File Prior to Visit  Medication Sig Dispense Refill   clonazePAM (KLONOPIN) 1 MG tablet Take 1 mg by mouth at bedtime as needed for anxiety.     docusate sodium (COLACE) 100 MG capsule Take 1 capsule (100 mg total) by mouth 2 (two) times daily. (Patient taking differently: Take 100 mg by mouth daily as needed for mild constipation.) 10 capsule 0   doxycycline (VIBRAMYCIN) 100 MG capsule Take 1 capsule (100 mg total) by mouth 2 (two) times daily. 20 capsule 0   FLUoxetine (PROZAC) 40 MG capsule Take 80 mg by mouth daily.  11   gabapentin (NEURONTIN) 100 MG capsule Take 200 mg by mouth 2 (two) times daily.     omeprazole (PRILOSEC) 20 MG capsule Take 20 mg by mouth daily.     oxyCODONE-acetaminophen (PERCOCET) 10-325 MG tablet Take 1 tablet by mouth 5 (five) times daily as needed for pain.     SUMAtriptan (IMITREX) 100 MG tablet Take 50-100 mg by mouth 2 (two) times daily as needed for migraine.     topiramate (TOPAMAX) 100 MG tablet Take 1 tablet (100 mg total) by mouth at bedtime. 90 tablet 3   traZODone (DESYREL) 100 MG tablet Take 200 mg by mouth at bedtime.     No current facility-administered medications on file prior to visit.    Plan Patient agrees to care plan and follow up within the next 10-14 business days for further screening, care coordination and disease management  services Provided RN CM office number and referred her to the mailed [facility] letter  Welcome letter sent with addition of [place] transportation number & benefit information  Goals Addressed               This Visit's Progress     Patient Stated     Enhance My Mental Skills [facility]) (pt-stated)   On track     Timeframe:  Long-Range Goal Priority:  High Start Date:                            03/15/21 Expected End Date:                06/26/21       Follow Up Date 03/29/21  Barriers: Knowledge Other- memory recall    - stay in touch with my family and friends     Notes:  03/15/21 confirms at times she is not able to recall well as 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) "Sometimes forgets" Has support of her friend  Selinda Flavin     Find Help in My Community [facility]) (pt-stated)   On track     Timeframe:  Long-Range Goal Priority:  High Start Date:                     03/15/21        Expected End Date:        06/26/21               Follow Up Date 03/29/21   Barriers: Knowledge Other- memory recall  - begin a notebook of services in my neighborhood or community - follow-up on any referrals for help I am given - make a list of family or friends that I can call     Notes:  03/15/21 reports she can drive and has a car but at times will need assist with being driven or transportation to medical appointments. Was not aware of her insurance transportation benefits.Agreed to conference with RN CM to [place] healthcare for review of benefit. Agrees to further follow up for further resources           [place] L. Lavina Hamman, RN, BSN, [place] Coordinator Office number [phone] Main [facility] number [phone] Fax number [phone]

## 2021-03-17 DIAGNOSIS — S59902A Unspecified injury of left elbow, initial encounter: Secondary | ICD-10-CM | POA: Diagnosis not present

## 2021-03-17 DIAGNOSIS — Z681 Body mass index (BMI) 19 or less, adult: Secondary | ICD-10-CM | POA: Diagnosis not present

## 2021-03-17 DIAGNOSIS — R030 Elevated blood-pressure reading, without diagnosis of hypertension: Secondary | ICD-10-CM | POA: Diagnosis not present

## 2021-03-19 NOTE — Progress Notes (Signed)
Assessment/Plan:   1.  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  She has had very serious consequences of falls including fractures and subdural hematoma.  Have discussed with her multiple times that I no longer want her walking.  Unfortunately, she has not followed those recommendations and has continued to have more fracture since then (T2 spinous process, ribs).   I would like her in a wheelchair at all times.   discussed power WC.  She absolutely declines.  I told her that I would not expect that she will likely have a hip fracture and another brain bleed if she does not stay in a wheelchair.    -Patient met with my LCSW today.  -Talked with the patient about the atypical support group.  2.  Chronic back pain  -Would really like to see the patient off of opioid medications, primarily because these also increase her risk for falls and confusion.  She gets between 140 and 170 oxycodone per month.  3.  Weight loss  -discussed the topamax.  Been on it for 20 years.  Having more headaches lately too so not sure it is helping.  She also may be getting rebound from the opioids.  4.  Anxiety  -On clonazepam, 1 mg twice per day.  Patient asked about increasing this.  I certainly do not think that this needs to be increased and, quite the opposite, I think she needs to be weaned off of this.  This likely is contributing to the falls and the gait instability, along with the opioids, not to speak of the potentiation between benzodiazepines and opioids.  -I think patient needs significant counseling.  That was offered through my office today.   Subjective:   Dorothy White was seen today in follow up for Parkinsons disease.  My previous records were reviewed prior to todays visit as well as outside records available to me. Last visit, the patient and I discussed power wheelchair in detail, as I told her I really wanted her to stay seated  at all times.  The patient became upset and overwhelmed with the conversation and the degenerative nature of her disease.  We decided to bring her back soon after that visit to revisit the discussion.  Unfortunately, the patient same-day canceled her April visit and rescheduled that to June, and then canceled that as well.  Patient has been in the emergency room again since our last visit.  She was there on April 6.  I have reviewed those records.  She apparently was having generalized weakness and was sent to the emergency room by her physical therapist.  Work-up was negative, including CT brain.  She was in the emergency room with multiple falls on June 8.  Reports that she had fallen about 6 times prior to presenting to the emergency room that day.  Scans indicated she had T2 spinous process fracture.  CT brain was negative.  She was back in the emergency room June 18 with another cough, but emergency room noticed bruising to the right thigh, and she had stated that she had fallen a few days before.  She was having rib pain.  X-rays revealed right-sided rib fractures (2).  Reports that she is not interested in power WC.  She doesn't think that home accomodates it.  Has no suppport at home.  Has only a room mate.  Admits to weight loss.  Poor appetite.  States that eating habits are poor.  Doesn't like to cook for 1 person.  Mood is very depressed.  Is f/u with PCP tomorrow.  On trazodone for sleep and not sure helping.  Brother died a month ago and is anxious.  Asks about increasing the klonopin   PREVIOUS MEDICATIONS: levodopa (nausea, ?diarrhea but has chronic diarrhea)    PREVIOUS MEDICATIONS: Sinemet  ALLERGIES:   Allergies  Allergen Reactions   Opana [Oxymorphone Hcl] Other (See Comments)    hallucinations   Penicillins Hives    Has patient had a PCN reaction causing immediate rash, facial/tongue/throat swelling, SOB or lightheadedness with hypotension: Unknown Has patient had a PCN reaction  causing severe rash involving mucus membranes or skin necrosis: No Has patient had a PCN reaction that required hospitalization No Has patient had a PCN reaction occurring within the last 10 years: No If all of the above answers are "NO", then may proceed with Cephalosporin use.     CURRENT MEDICATIONS:  Outpatient Encounter Medications as of 03/21/2021  Medication Sig   clonazePAM (KLONOPIN) 1 MG tablet Take 1 mg by mouth at bedtime as needed for anxiety.   docusate sodium (COLACE) 100 MG capsule Take 1 capsule (100 mg total) by mouth 2 (two) times daily. (Patient taking differently: Take 100 mg by mouth daily as needed for mild constipation.)   FLUoxetine (PROZAC) 40 MG capsule Take 80 mg by mouth daily.   gabapentin (NEURONTIN) 100 MG capsule Take 200 mg by mouth 2 (two) times daily.   omeprazole (PRILOSEC) 20 MG capsule Take 20 mg by mouth daily.   oxyCODONE-acetaminophen (PERCOCET) 10-325 MG tablet Take 1 tablet by mouth 5 (five) times daily as needed for pain.   SUMAtriptan (IMITREX) 100 MG tablet Take 50-100 mg by mouth 2 (two) times daily as needed for migraine.   topiramate (TOPAMAX) 100 MG tablet Take 1 tablet (100 mg total) by mouth at bedtime.   traZODone (DESYREL) 100 MG tablet Take 200 mg by mouth at bedtime.   [DISCONTINUED] doxycycline (VIBRAMYCIN) 100 MG capsule Take 1 capsule (100 mg total) by mouth 2 (two) times daily. (Patient not taking: Reported on 03/21/2021)   No facility-administered encounter medications on file as of 03/21/2021.    Objective:   PHYSICAL EXAMINATION:    VITALS:   Vitals:   03/21/21 1117  BP: 139/60  Pulse: 82  SpO2: 97%  Weight: 94 lb 3.2 oz (42.7 kg)  Height: _0  (1.651 m)   Wt Readings from Last 3 Encounters:  03/21/21 94 lb 3.2 oz (42.7 kg)  03/15/21 90 lb (40.8 kg)  12/24/20 103 lb (46.7 kg)    GEN:  The patient appears stated age and is in NAD. HEENT:  Normocephalic, atraumatic.  The mucous membranes are moist. The superficial  temporal arteries are without ropiness or tenderness. CV:  RRR Lungs:  CTAB Neck/HEME:  There are no carotid bruits bilaterally.  Neurological examination:  Orientation: The patient is alert and oriented x3. Cranial nerves: There is good facial symmetry with min facial hypomimia. The speech is fluent and clear. Soft palate rises symmetrically and there is no tongue deviation. Hearing is intact to conversational tone. Sensation: Sensation is intact to light touch throughout Motor: Strength is at least antigravity x4.  Movement examination: Tone: There is normal tone in the RUE/RLE.  Unable to test in the LUE due to wearing sling.  Tone increased in the LLE, mild Abnormal movements: rare tremor at rest in the L thumb that doesn't increase with distraction Coordination:  There  is slowness on the left, especially with toe taps. Gait and Station: The patient has no difficulty arising out of a deep-seated chair without the use of the hands. The patient's stride length is wide based, short stepped, ataxic.   I have reviewed and interpreted the following labs independently    Chemistry      Component Value Date/Time   NA 139 01/12/2021 1402   NA 147 04/13/2017 0000   K 3.6 01/12/2021 1402   CL 108 01/12/2021 1402   CO2 19 (L) 01/12/2021 1402   BUN 15 01/12/2021 1402   BUN 11 04/13/2017 0000   CREATININE 0.61 01/12/2021 1402   CREATININE 0.95 02/15/2016 1536   GLU 93 04/13/2017 0000      Component Value Date/Time   CALCIUM 9.0 01/12/2021 1402   CALCIUM 9.4 01/13/2007 2356   ALKPHOS 103 01/12/2021 1402   AST 12 (L) 01/12/2021 1402   ALT 8 01/12/2021 1402   BILITOT 1.1 01/12/2021 1402       Lab Results  Component Value Date   WBC 7.1 01/12/2021   HGB 11.5 (L) 01/12/2021   HCT 39.3 01/12/2021   MCV 81.4 01/12/2021   PLT 251 01/12/2021    Lab Results  Component Value Date   TSH 0.720 12/17/2017     Total time spent on today's visit was 23mnutes, including both  face-to-face time and nonface-to-face time.  Time included that spent on review of records (prior notes available to me/labs/imaging if pertinent), discussing treatment and goals, answering patient's questions and coordinating care.  Cc:  WMaurice Small MD

## 2021-03-21 ENCOUNTER — Ambulatory Visit (INDEPENDENT_AMBULATORY_CARE_PROVIDER_SITE_OTHER): Payer: Medicare Other | Admitting: Neurology

## 2021-03-21 ENCOUNTER — Other Ambulatory Visit: Payer: Self-pay

## 2021-03-21 ENCOUNTER — Encounter: Payer: Self-pay | Admitting: Neurology

## 2021-03-21 VITALS — BP 139/60 | HR 82 | Ht 65.0 in | Wt 94.2 lb

## 2021-03-21 DIAGNOSIS — G238 Other specified degenerative diseases of basal ganglia: Secondary | ICD-10-CM | POA: Diagnosis not present

## 2021-03-21 DIAGNOSIS — R634 Abnormal weight loss: Secondary | ICD-10-CM

## 2021-03-21 MED ORDER — MIRTAZAPINE 15 MG PO TABS: 15.0000 mg | ORAL_TABLET | Freq: Every day | ORAL | 1 refills | Status: DC

## 2021-03-21 NOTE — Patient Instructions (Addendum)
Week 1: Decrease topiramate to 50 mg daily  Decrease trazodone, 50 mg daily Start mirtazapine, 15 mg nightly  Week 2: Stop topiramate Decrease trazodone, 25 mg nightly Continue mirtazapine, 15 mg nightly  Week 3: STOP trazodone Continue mirtazapine 15 mg at night  Try ensure or special K brain protein shakes 2 times per day

## 2021-03-22 ENCOUNTER — Telehealth: Payer: Self-pay | Admitting: Neurology

## 2021-03-22 NOTE — Telephone Encounter (Signed)
Pt called back no answer left a voice mail for her to call back when she calls back let her know that is to Decrease topiramate to 50 mg daily for a week and then stop. No need to send in a new script at this time,

## 2021-03-22 NOTE — Telephone Encounter (Signed)
Pt would like Tat to send a new RX for topamax '50mg'$  to [facility]. She stated she has 1 refill at another pharmacy but she cant get it transferred due to them being ugly/rude.

## 2021-03-27 NOTE — Telephone Encounter (Signed)
Pt has stopped her topiramate. She stated that she is getting some headaches.

## 2021-03-29 ENCOUNTER — Other Ambulatory Visit: Payer: Self-pay | Admitting: *Deleted

## 2021-03-29 ENCOUNTER — Other Ambulatory Visit: Payer: Self-pay

## 2021-03-29 NOTE — Patient Outreach (Signed)
[facility]) Care Management  03/29/2021  Dorothy White [birth year] [id number]   [facility] outreach follow up to [place] referred patient   Dorothy Dorothy White was referred to [facility] on 03/07/21 after a [facility] patient engagement score of 9 with [facility] CMA This screening initiated the concern for transportation to medical appointments, help with driving for shopping and errands, falls and memory concerns    [place] care [facility]) [place] at [place] and [place] of [place]  Reached Dorothy White at [phone] Introduction provided and review of 03/15/21 outreach briefly when she inquire who RN CM was and considering she informed RN CM she has some memory concerns during the last outreach The outreach was disconnected by Dorothy White   Review of EPIC notes since 03/15/21 RN CM outreach  Noted an 03/21/21 Neurology office visit with social worker counseling for Dr Tat concerns with patient multiple falls and use of opioid medications, primarily because these and her clonazepam also increase her risk for falls and confusion Outreach to Dr Tat office [phone] to attempt to collaborate with the office Social worker    Plans RN CM will attempt to re engage Dorothy White within the next 4-7 business days, especially if no return call from her (possible accidental disconnection)   Dorothy White L. Lavina Hamman, RN, BSN, [place] Coordinator Office number [phone] Main [facility] number [phone] Fax number [phone]

## 2021-04-02 ENCOUNTER — Telehealth: Payer: Self-pay | Admitting: Neurology

## 2021-04-02 NOTE — Telephone Encounter (Signed)
Pt called and is wanting to know about her medication side effects. Mirtazapine. She said she is having constipation and headaches.

## 2021-04-02 NOTE — Telephone Encounter (Signed)
Patient has been having headaches as she was coming off of Topomax and other narcotics. I read her past phone notes and its the same side effects she has been having anything additional you would like me to tell her?

## 2021-04-02 NOTE — Telephone Encounter (Signed)
Called patient and gave he Dr. Arturo Morton opinion on her headaches and spoke to her about constipation. Patient agreed to wait some more time and try to let the medication gt out of her system

## 2021-04-05 ENCOUNTER — Other Ambulatory Visit: Payer: Self-pay | Admitting: *Deleted

## 2021-04-05 NOTE — Telephone Encounter (Signed)
While most certainly concerning, those are not SE of mirtazapine.  Pt should contact pcp

## 2021-04-05 NOTE — Patient Outreach (Signed)
[facility]) Care Management  04/05/2021  ARBEDELLA CALLINS [birth year] [id number]   [facility] collaboration with neurology SW for [place] EMMI referred patient   Dorothy White was referred to [facility] on 03/07/21 after a [facility] patient engagement score of 9 with [facility] CMA This screening initiated the concern for transportation to medical appointments, help with driving for shopping and errands, falls and memory concerns    [place] care [facility]) [place] and [place] of [place]    Incoming outreach from [place], [place] from Dr Tat office  Reviewed [facility] initial assessment and change in [facility] engagement concerns related to patient. Collaboration with [place] who will see patient on 04/12/21 in MD office. Misty mentions also concern with pt support at home  RN CM will attempt another outreach within the next 7 business days to re engage, continue to assess patient for needs and assist with services or resources as needed   Plans RN CM will attempt to re engage Dorothy Merante within the next 4-7 business days, especially if no return call from her (possible accidental disconnection)    Daijha Leggio L. Lavina Hamman, RN, BSN, [place] Coordinator Office number [phone] Main [facility] number [phone] Fax number [phone]

## 2021-04-05 NOTE — Telephone Encounter (Signed)
  Called patient back and let her know Dr. Carles Collet recommends she call her PCP with  the symptoms she is having with increased pulse and high blood pressure. Patient was confused if it was an interaction with her other medications and I told her to contact her PCP Patient agreed and is contacting them today

## 2021-04-05 NOTE — Telephone Encounter (Signed)
Pt called back in and left a message. She stated her Mirtazapine is causing her pulse to race and her blood pressure is high. She might just stop taking it. She would like a call back to know what to do.

## 2021-04-09 ENCOUNTER — Other Ambulatory Visit: Payer: Self-pay | Admitting: *Deleted

## 2021-04-09 ENCOUNTER — Ambulatory Visit: Payer: Medicare Other | Admitting: Neurology

## 2021-04-09 NOTE — Patient Outreach (Signed)
[name]) Care Management  04/09/2021  Dorothy White 1950-11-27 [id number]   [facility] Unsuccessful outreach follow up to [place] referred patient   Ms Dorothy White was referred to [facility] on 03/07/21 after a [facility] patient engagement score of 9 with [facility] CMA This screening initiated the concern for transportation to medical appointments, help with driving for shopping and errands, falls and memory concerns    [place] care [facility]) [place] and medicaid of [place] attempt to the home number at [phone] No answer. THN RN CM left HIPAA [facility] Portability and Accountability Act) compliant voicemail message along with CM's contact info.   Plan: [facility] RN CM scheduled this patient for another call attempt within 4-7 business days Unsuccessful outreach letter sent on 04/09/21 Unsuccessful outreach on 03/29/21, 04/09/21   Joelene Millin L. Lavina Hamman, RN, BSN, [place] Coordinator Office number [phone] Mobile number [phone]  Main [facility] number [phone] Fax number [phone]'

## 2021-04-12 ENCOUNTER — Ambulatory Visit: Payer: Medicare Other | Admitting: Licensed Clinical Social Worker

## 2021-04-12 ENCOUNTER — Other Ambulatory Visit: Payer: Self-pay

## 2021-04-12 DIAGNOSIS — F4321 Adjustment disorder with depressed mood: Secondary | ICD-10-CM

## 2021-04-12 NOTE — BH Specialist Note (Signed)
Integrated Behavioral Health Initial In-Person Visit  MRN: [id number] Name: Dorothy White  Number of [place] Clinician visits:: 1/6 Session Start time: 1:00  Session End time: 2:00 Total time: 60 minutes  Types of Service: [place] (BHI)  Interpretor:No. Interpretor Name and Language: NA   Warm Hand Off Completed. LCSW completed initial warm hand off at Neurology appointment explaining services of LCSW         Subjective: Dorothy White is a 70 y.o. female accompanied by  Self Patient was referred by Dr. Wells Guiles Tat  for Depressed mood and assistance with coping complying  due to medical issues . 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  Duration of problem: Several months; Severity of problem: moderate  Objective: Mood: Depressed and Hopeless and Affect: Appropriate Risk of harm to self or others: No plan to harm self or others  Life Context: Family and Social: Patient reports limited family, brother passed last month .  Has a nephew that she is maintaining contact.  Has female friend that rents a room and is supportive.   School/Work: Does not work Self-Care: She uses a cane and has been encouraged to use a wheel chair for safety but declines at this time. 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   Patient and/or Family's Strengths/Protective Factors: Resilience with past struggles, has basic needs .   Goals Addressed: Patient will: Reduce symptoms of: depression Increase knowledge and/or ability of: coping skills and healthy habits  Demonstrate ability to: Increase healthy adjustment to current life circumstances and Increase adequate support systems for patient/family  Progress towards  Goals: Ongoing  Interventions: Interventions utilized: Motivational Interviewing, Solution-Focused Strategies, and Link to [employer] Assessments completed: Not Needed  Patient and/or Family Response: Pt reports openness to suggestions of obtaining support from [facility], and support group .   Patient Centered Plan: Patient is on the following Treatment Plan(s):  Reduce depression promoting thoughts and increase support system.     Assessment: Patient currently experiencing Feelings of sadness, and reported feelings of hopelessness due to Dx of MSA (Multiple System Atrophy) . During this session explored how psychological and physical factors affect one another. Also ability to take an active role in health care. In addition, what has provided sources of vitality or zest for patient and encourage to identify those items ways of incorporating that in her daily life.   Finally, how support groups can be a safe place to share personal experiences , coping strategies, and options to help patient.    Patient may benefit from Increase feelings of self worth, increase support system , follow up with resources .  Plan: Follow up with behavioral health clinician on : In two to three weeks  Behavioral recommendations: Follow up with Local and National Support Group.  Follow up to [facility] , identify what provides enjoyment, what matters, what patient cares about to look at her vaules and exploring interest.  Referral(s): Community Resources:  [facility], National and Local Support Group .  Also [facility]  "From scale of 1-10, how likely are you to follow plan?": 7  Lendell Gallick A Taylor-Paladino, LCSW

## 2021-04-16 ENCOUNTER — Other Ambulatory Visit: Payer: Self-pay | Admitting: *Deleted

## 2021-04-16 NOTE — Patient Outreach (Signed)
[place] [facility]) Care Management  04/16/2021  Dorothy White January 31, 1951 [id number]   [facility] Unsuccessful outreach  Dorothy White was referred to [facility] on 03/07/21 after a [facility] patient engagement score of 9 with [facility] CMA This screening initiated the concern for transportation to medical appointments, help with driving for shopping and errands, falls and memory concerns    [place] care [facility]) [address] and [place] of [place]  With review of EPIC notes, RN CM notes a completed visit with Misty, LCSW on 04/12/21    Outreach attempt to the home number at [phone] No answer. [facility] RN CM left HIPAA [facility] Portability and Accountability Act) compliant voicemail message along with CM's contact info.    Plan: [facility] RN CM scheduled this patient for case closure per workflow, Pending possible return call from patient Unsuccessful outreach letter sent on 04/09/21 Unsuccessful outreach on 03/29/21, 04/09/21, 04/16/21  Senna Lape L. Lavina Hamman, RN, BSN, [place] Coordinator Office number [phone] Mobile number [phone]  Main [facility] number [phone] Fax number [phone]

## 2021-05-01 ENCOUNTER — Other Ambulatory Visit: Payer: Self-pay | Admitting: *Deleted

## 2021-05-01 NOTE — Patient Outreach (Signed)
[place] [facility]) Care Management  05/01/2021  EQUILLA QUE [birth year] [id number]   [facility] Case closure   Ms KONNI KESINGER was referred to [facility] on 03/07/21 after a [facility] patient engagement score of 9 with [facility] CMA This screening initiated the concern for transportation to medical appointments, help with driving for shopping and errands, falls and memory concerns  Completed initial engagement with patient on 03/15/21 After noted difficulty in follow up patient engagement on 03/29/21, RN CM collaborated with her neurology SW on 04/05/21 on identified concerns and needs.   Insurance  [facility]) [employer] and medicaid of [place]   With review of EPIC notes, RN CM notes a completed visit with Misty, LCSW on 04/12/21   Unsuccessful outreach letter sent on 04/09/21 Unsuccessful outreach on 03/29/21, 04/09/21, 04/16/21   Plan [facility] RN CM will close case per workflow Unable to maintain contact Case closure letters sent to patient and MD  Joelene Millin L. Lavina Hamman, RN, BSN, [place] Coordinator Office number [phone] Mobile number [phone]  Main [facility] number [phone] Fax number [phone]

## 2021-05-02 DIAGNOSIS — G903 Multi-system degeneration of the autonomic nervous system: Secondary | ICD-10-CM | POA: Diagnosis not present

## 2021-05-02 DIAGNOSIS — R296 Repeated falls: Secondary | ICD-10-CM | POA: Diagnosis not present

## 2021-05-02 DIAGNOSIS — E538 Deficiency of other specified B group vitamins: Secondary | ICD-10-CM | POA: Diagnosis not present

## 2021-05-02 DIAGNOSIS — E43 Unspecified severe protein-calorie malnutrition: Secondary | ICD-10-CM | POA: Diagnosis not present

## 2021-05-03 ENCOUNTER — Institutional Professional Consult (permissible substitution): Payer: Medicare Other | Admitting: Licensed Clinical Social Worker

## 2021-05-07 DIAGNOSIS — H532 Diplopia: Secondary | ICD-10-CM | POA: Diagnosis not present

## 2021-05-07 DIAGNOSIS — H40023 Open angle with borderline findings, high risk, bilateral: Secondary | ICD-10-CM | POA: Diagnosis not present

## 2021-05-07 DIAGNOSIS — H524 Presbyopia: Secondary | ICD-10-CM | POA: Diagnosis not present

## 2021-05-07 DIAGNOSIS — H353131 Nonexudative age-related macular degeneration, bilateral, early dry stage: Secondary | ICD-10-CM | POA: Diagnosis not present

## 2021-05-08 DIAGNOSIS — E538 Deficiency of other specified B group vitamins: Secondary | ICD-10-CM | POA: Diagnosis not present

## 2021-05-23 DIAGNOSIS — E538 Deficiency of other specified B group vitamins: Secondary | ICD-10-CM | POA: Diagnosis not present

## 2021-05-23 DIAGNOSIS — Z23 Encounter for immunization: Secondary | ICD-10-CM | POA: Diagnosis not present

## 2021-05-31 ENCOUNTER — Other Ambulatory Visit: Payer: Self-pay

## 2021-05-31 ENCOUNTER — Emergency Department (HOSPITAL_COMMUNITY)
Admission: EM | Admit: 2021-05-31 | Discharge: 2021-05-31 | Disposition: A | Discharge status: Home / Self Care | Payer: Medicare Other | Attending: Student | Admitting: Student

## 2021-05-31 ENCOUNTER — Emergency Department (HOSPITAL_COMMUNITY): Payer: Medicare Other

## 2021-05-31 ENCOUNTER — Encounter (HOSPITAL_COMMUNITY): Payer: Self-pay | Admitting: Emergency Medicine

## 2021-05-31 DIAGNOSIS — R0789 Other chest pain: Secondary | ICD-10-CM | POA: Diagnosis not present

## 2021-05-31 DIAGNOSIS — R079 Chest pain, unspecified: Secondary | ICD-10-CM | POA: Diagnosis not present

## 2021-05-31 DIAGNOSIS — R600 Localized edema: Secondary | ICD-10-CM | POA: Diagnosis not present

## 2021-05-31 DIAGNOSIS — Z5321 Procedure and treatment not carried out due to patient leaving prior to being seen by health care provider: Secondary | ICD-10-CM | POA: Diagnosis not present

## 2021-05-31 DIAGNOSIS — M7989 Other specified soft tissue disorders: Secondary | ICD-10-CM | POA: Insufficient documentation

## 2021-05-31 LAB — BASIC METABOLIC PANEL
Anion gap: 6 (ref 5–15)
BUN: 11 mg/dL (ref 8–23)
CO2: 27 mmol/L (ref 22–32)
Calcium: 8.8 mg/dL — ABNORMAL LOW (ref 8.9–10.3)
Chloride: 104 mmol/L (ref 98–111)
Creatinine, Ser: 0.59 mg/dL (ref 0.44–1.00)
GFR, Estimated: >60 mL/min (ref >60)
Glucose, Bld: 99 mg/dL (ref 70–99)
Potassium: 4.4 mmol/L (ref 3.5–5.1)
Sodium: 137 mmol/L (ref 135–145)

## 2021-05-31 LAB — CBC WITH DIFFERENTIAL/PLATELET
Abs Immature Granulocytes: 0.02 10*3/uL (ref 0.00–0.07)
Basophils Absolute: 0 10*3/uL (ref 0.0–0.1)
Basophils Relative: 1 %
Eosinophils Absolute: 0.1 10*3/uL (ref 0.0–0.5)
Eosinophils Relative: 1 %
HCT: 37.8 % (ref 36.0–46.0)
Hemoglobin: 11 g/dL — ABNORMAL LOW (ref 12.0–15.0)
Immature Granulocytes: 0 %
Lymphocytes Relative: 17 %
Lymphs Abs: 1.3 10*3/uL (ref 0.7–4.0)
MCH: 22.4 pg — ABNORMAL LOW (ref 26.0–34.0)
MCHC: 29.1 g/dL — ABNORMAL LOW (ref 30.0–36.0)
MCV: 77 fL — ABNORMAL LOW (ref 80.0–100.0)
Monocytes Absolute: 0.4 10*3/uL (ref 0.1–1.0)
Monocytes Relative: 6 %
Neutro Abs: 5.4 10*3/uL (ref 1.7–7.7)
Neutrophils Relative %: 75 %
Platelets: 306 10*3/uL (ref 150–400)
RBC: 4.91 MIL/uL (ref 3.87–5.11)
RDW: 15.7 % — ABNORMAL HIGH (ref 11.5–15.5)
WBC: 7.2 10*3/uL (ref 4.0–10.5)
nRBC: 0 % (ref 0.0–0.2)

## 2021-05-31 LAB — TROPONIN I (HIGH SENSITIVITY): Troponin I (High Sensitivity): 2 ng/L (ref <18)

## 2021-05-31 LAB — BRAIN NATRIURETIC PEPTIDE: B Natriuretic Peptide: 166.5 pg/mL — ABNORMAL HIGH (ref 0.0–100.0)

## 2021-05-31 NOTE — ED Triage Notes (Signed)
Complains of chest pain that started about an hour ago, central chest, also complains of BLE swelling w/ 2+ pitting edema x2 days.

## 2021-05-31 NOTE — ED Notes (Signed)
I called patient for vital sign recheck went outside and in the bathroom no one responded

## 2021-05-31 NOTE — ED Provider Notes (Signed)
Emergency Medicine Provider Triage Evaluation Note  Dorothy White , a 70 y.o. female  was evaluated in triage.  Pt complains of central chest pain x2 to 3 hours.  Is been constant, feels like pressure and does not radiate.  Reports he has also been having bilateral ankle swelling for the last 2 to 3 days.  She has been trying compression stockings which have not helped.  History of an MI in 2017 per the patient, has not seen cardiologist in some time..  Review of Systems  Positive: Chest pain, ankle swelling Negative: Shortness of breath, nausea, vomiting  Physical Exam  BP (!) 143/78 (BP Location: Left Arm)   Pulse 78   Temp 97.8 F (36.6 C) (Oral)   Resp 16   Ht 5\' 5"  (1.651 m)   Wt 43 kg   SpO2 99%   BMI 15.78 kg/m  Gen:   Awake, no distress   Resp:  Normal effort  MSK:   Moves extremities without difficulty  Other:  Bilateral ankle edema  Medical Decision Making  Medically screening exam initiated at 2:46 PM.  Appropriate orders placed.  Dorothy White was informed that the remainder of the evaluation will be completed by another provider, this initial triage assessment does not replace that evaluation, and the importance of remaining in the ED until their evaluation is complete.  Chest pain work-up   Dorothy Raring, PA-C 05/31/21 1449    Dorothy Leigh, MD 06/01/21 [phone]

## 2021-06-05 DIAGNOSIS — Z8781 Personal history of (healed) traumatic fracture: Secondary | ICD-10-CM | POA: Diagnosis not present

## 2021-06-05 DIAGNOSIS — J3489 Other specified disorders of nose and nasal sinuses: Secondary | ICD-10-CM | POA: Diagnosis not present

## 2021-06-17 DIAGNOSIS — E538 Deficiency of other specified B group vitamins: Secondary | ICD-10-CM | POA: Diagnosis not present

## 2021-06-17 DIAGNOSIS — R600 Localized edema: Secondary | ICD-10-CM | POA: Diagnosis not present

## 2021-06-17 DIAGNOSIS — R799 Abnormal finding of blood chemistry, unspecified: Secondary | ICD-10-CM | POA: Diagnosis not present

## 2021-06-17 DIAGNOSIS — R2689 Other abnormalities of gait and mobility: Secondary | ICD-10-CM | POA: Diagnosis not present

## 2021-06-24 ENCOUNTER — Other Ambulatory Visit (HOSPITAL_COMMUNITY): Payer: Self-pay | Admitting: Family Medicine

## 2021-06-24 DIAGNOSIS — R799 Abnormal finding of blood chemistry, unspecified: Secondary | ICD-10-CM

## 2021-06-24 NOTE — Progress Notes (Unsigned)
R79.9

## 2021-06-25 ENCOUNTER — Other Ambulatory Visit: Payer: Self-pay | Admitting: Family Medicine

## 2021-06-25 DIAGNOSIS — E2839 Other primary ovarian failure: Secondary | ICD-10-CM

## 2021-06-27 DIAGNOSIS — Z79891 Long term (current) use of opiate analgesic: Secondary | ICD-10-CM | POA: Diagnosis not present

## 2021-06-27 DIAGNOSIS — M5412 Radiculopathy, cervical region: Secondary | ICD-10-CM | POA: Diagnosis not present

## 2021-06-27 DIAGNOSIS — M5416 Radiculopathy, lumbar region: Secondary | ICD-10-CM | POA: Diagnosis not present

## 2021-06-27 DIAGNOSIS — Z5181 Encounter for therapeutic drug level monitoring: Secondary | ICD-10-CM | POA: Diagnosis not present

## 2021-06-27 DIAGNOSIS — M5136 Other intervertebral disc degeneration, lumbar region: Secondary | ICD-10-CM | POA: Diagnosis not present

## 2021-06-27 DIAGNOSIS — Z79899 Other long term (current) drug therapy: Secondary | ICD-10-CM | POA: Diagnosis not present

## 2021-06-27 DIAGNOSIS — G894 Chronic pain syndrome: Secondary | ICD-10-CM | POA: Diagnosis not present

## 2021-06-27 DIAGNOSIS — M5030 Other cervical disc degeneration, unspecified cervical region: Secondary | ICD-10-CM | POA: Diagnosis not present

## 2021-07-09 ENCOUNTER — Ambulatory Visit (HOSPITAL_COMMUNITY): Payer: Medicare Other | Attending: Family Medicine

## 2021-07-09 ENCOUNTER — Encounter (HOSPITAL_COMMUNITY): Payer: Self-pay | Admitting: Family Medicine

## 2021-07-09 ENCOUNTER — Encounter (HOSPITAL_COMMUNITY): Payer: Self-pay

## 2021-07-23 DIAGNOSIS — J300 Vasomotor rhinitis: Secondary | ICD-10-CM | POA: Diagnosis not present

## 2021-07-23 DIAGNOSIS — J3489 Other specified disorders of nose and nasal sinuses: Secondary | ICD-10-CM | POA: Diagnosis not present

## 2021-07-29 ENCOUNTER — Other Ambulatory Visit: Payer: Self-pay | Admitting: Neurology

## 2021-07-30 ENCOUNTER — Other Ambulatory Visit: Payer: Self-pay

## 2021-08-12 ENCOUNTER — Ambulatory Visit (HOSPITAL_COMMUNITY): Payer: Medicare Other

## 2021-08-14 ENCOUNTER — Telehealth: Payer: Self-pay | Admitting: Neurology

## 2021-08-14 NOTE — Telephone Encounter (Signed)
Pt called in stating she would like to be referred for physical therapy. She is getting stiff.

## 2021-08-15 NOTE — Telephone Encounter (Signed)
Patient is returning a call to someone. °

## 2021-08-15 NOTE — Telephone Encounter (Signed)
Called patient and left voicemail.

## 2021-08-15 NOTE — Telephone Encounter (Signed)
Pt called an informed Dr Tat does not recommend PT for her as it doesn't help with this diagnosis and she will continue to fall, as we have seen.  Dr Tat does think your pain meds contribute to your  falls (may be the main factor but I have never seen her off of them - we have discussed before).  PDMP reviewed.  She has received #175 oxycodone in the last 30 days. Pt stated she is not falling just still, she stated that her PCP said physical therapy was a good Idea. Again pt was told Dr Tat does not recommend it the therapy she stated she is waiting on her primary dr to call her back ,

## 2021-08-15 NOTE — Telephone Encounter (Signed)
Pt called no answer left a voice mail to call the office back  °

## 2021-08-19 ENCOUNTER — Encounter (HOSPITAL_COMMUNITY): Payer: Self-pay

## 2021-08-19 ENCOUNTER — Ambulatory Visit (HOSPITAL_COMMUNITY): Admission: RE | Admit: 2021-08-19 | Payer: Medicare Other | Source: Ambulatory Visit

## 2021-08-19 ENCOUNTER — Encounter (HOSPITAL_COMMUNITY): Payer: Self-pay | Admitting: Student

## 2021-08-22 ENCOUNTER — Ambulatory Visit: Payer: Medicare Other | Attending: Family Medicine | Admitting: Physical Therapy

## 2021-08-22 ENCOUNTER — Ambulatory Visit: Payer: Medicare Other

## 2021-08-22 ENCOUNTER — Encounter: Payer: Self-pay | Admitting: Physical Therapy

## 2021-08-22 ENCOUNTER — Other Ambulatory Visit: Payer: Self-pay

## 2021-08-22 DIAGNOSIS — R2689 Other abnormalities of gait and mobility: Secondary | ICD-10-CM | POA: Insufficient documentation

## 2021-08-22 DIAGNOSIS — R296 Repeated falls: Secondary | ICD-10-CM | POA: Diagnosis not present

## 2021-08-22 DIAGNOSIS — M6281 Muscle weakness (generalized): Secondary | ICD-10-CM | POA: Diagnosis not present

## 2021-08-22 NOTE — Patient Instructions (Signed)
Access Code: [facility] URL: [email or web address] Date: 08/22/2021 Prepared by: Venetia Night Koleen Celia  Exercises Sit to Stand - 3 x daily - 7 x weekly - 1 sets - 5 reps

## 2021-08-22 NOTE — Therapy (Signed)
[place] @ [place], [place], [address] Phone: [phone]   Fax:  [phone]  Physical Therapy Evaluation  Patient Details  Name: Dorothy White MRN: [id number] Date of Birth: 07/12/1951 Referring Provider (PT): Jonathon Jordan, MD   Encounter Date: 08/22/2021   PT End of Session - 08/22/21 1759     Visit Number 1    Date for PT Re-Evaluation 10/17/21    Authorization Type [employer] Medicare    Progress Note Due on Visit 10    PT Start Time 1015    PT Stop Time 1100    PT Time Calculation (min) 45 min    Activity Tolerance Patient tolerated treatment well    Behavior During Therapy [facility] for tasks assessed/performed             Past Medical History:  Diagnosis Date   Anemia    ?   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 25% mLAD, otherwise no sig disease 01/2016 cath.   NICM (nonischemic cardiomyopathy) ([place])    a. EF 40-45% by echo 01/2535 at time of complicated migraine/neuro sx, 55-65% at time of cath 01/2016   Stroke [facility]) 06/2013   "mini" stroke , ?possibly hemaplegic migraine per pt    Past Surgical History:  Procedure Laterality Date   ANTERIOR APPROACH HEMI HIP ARTHROPLASTY Left 04/01/2017   Procedure: LEFT DIRECT ANTERIOR TOTAL HIP REPLACEMENT;  Surgeon: Leandrew Koyanagi, MD;  Location: [place];  Service: Orthopedics;  Laterality: Left;  LEFT DIRECT ANTERIOR TOTAL HIP REPLACEMENT   AUGMENTATION MAMMAPLASTY  1980    BASAL CELL CARCINOMA EXCISION     "tip of my nose"   BUNIONECTOMY Bilateral 10/2003   CARDIAC CATHETERIZATION N/A 02/20/2016   Procedure: Left Heart Cath and Coronary Angiography;  Surgeon: Jettie Booze, MD;  Location: [place] CV [facility];  Service: Cardiovascular;  Laterality: N/A;   COLONOSCOPY     DILATION AND CURETTAGE OF UTERUS     FRACTURE SURGERY     IR GENERIC HISTORICAL  03/18/2016   IR SINUS/FIST TUBE CHK-NON GI 03/18/2016 Aletta Edouard, MD [facility]-INTERV RAD   LAPAROSCOPIC SIGMOID COLECTOMY N/A 04/25/2016   Procedure: LAPAROSCOPIC SIGMOID COLECTOMY;  Surgeon: Stark Klein, MD;  Location: [place];  Service: General;  Laterality: N/A;   OOPHORECTOMY Bilateral ~ 1999   ORIF HUMERUS FRACTURE Right 02/15/2015   Procedure: OPEN REDUCTION INTERNAL FIXATION (ORIF) PROXIMAL HUMERUS FRACTURE;  Surgeon: Netta Cedars, MD;  Location: [place];  Service: Orthopedics;  Laterality: Right;   ORIF HUMERUS FRACTURE Left 04/04/2020   ORIF HUMERUS FRACTURE Left 04/04/2020   Procedure: OPEN REDUCTION INTERNAL FIXATION (ORIF) PROXIMAL HUMERUS FRACTURE;  Surgeon: Iran Planas, MD;  Location: [place];  Service: Orthopedics;  Laterality: Left;  regional block   ORIF ULNAR FRACTURE Left 04/04/2020   ORIF ULNAR FRACTURE Left 04/04/2020   Procedure:  OPEN REDUCTION INTERNAL FIXATION (ORIF) ULNAR FRACTURE;  Surgeon: Iran Planas, MD;  Location: [place];  Service: Orthopedics;  Laterality: Left;  with regional block   [place] Right 08/11/2020   Procedure: TOTAL HIP ARTHROPLASTY ANTERIOR APPROACH;  Surgeon: Rod Can, MD;  Location: [facility];  Service: Orthopedics;  Laterality: Right;   TUBAL LIGATION  ~ [place]  ~ 1998    There were no vitals filed for this visit.    Subjective Assessment - 08/22/21 1019     Subjective Pt referred to OPPT with balance disorder and weakness.  She has MSA (multiple system atrophy), osteoporosis, and Hx of  multiple falls with fractures requiring fixation.  She has bil THA (anterior approach), ORIF Lt and Rt shoulder and elbow.  She has history of TIAs and has diffuse arthritis and weakness.  She uses [employer] for community ambulation.  She hangs onto walls in house for balance ("not enough room in home to use [facility].")    Pertinent History cervical and lumbar spondylosis    How long can you walk comfortably? 20 min    Patient Stated Goals reduce stiffness, work on strength and balance    Currently in Pain? Yes    Pain Score 7    ranges from 4-9/10   Pain Location Back    Pain Orientation Posterior;Lower;Mid;Upper    Pain Descriptors / Indicators Tightness;Dull    Pain Type Chronic pain    Pain Onset More than a month ago    Pain Frequency Constant    Aggravating Factors  unsure    Pain Relieving Factors heat    Effect of Pain on Daily Activities reaching, fine motor tasks due to weak hands                [facility] PT Assessment - 08/22/21 0001       Assessment   Medical Diagnosis R26.89 (ICD-10-CM) - Other abnormalities of gait and mobility    Referring Provider (PT) Jonathon Jordan, MD    Onset Date/Surgical Date --   years, chronic pain   Hand Dominance Right    Next MD Visit 08/28/21   physical   Prior Therapy yes      Precautions   Precautions Anterior Hip;Fall    Precaution Comments bil anterior approach hip THA, osteoporosis      Restrictions   Weight Bearing Restrictions No      Balance Screen   Has the patient fallen in the past 6 months No    Has the patient had a decrease in activity level because of a fear of falling?  Yes    Is the patient reluctant to leave their home because of a fear of falling?  Yes      [place] residence    Living Arrangements Non-relatives/Friends    Type of [place] Access Level entry    [place] bars - tub/shower;Grab bars - toilet;Other (comment)   grab bars in  hallway have been installed   Additional Comments goes upstairs to neighbor's living space      Prior Function   Level of [place] Retired    Leisure watch TV, listen to music      Cognition   Overall Cognitive Status Within Functional Limits for tasks assessed  ROM / Strength   AROM / PROM / Strength AROM;Strength      AROM   Overall AROM Comments Hx of ORIF Lt and Rt shoulder    AROM Assessment Site Shoulder    Right/Left Shoulder Right;Left    Right Shoulder Flexion 100 Degrees    Left Shoulder Flexion 95 Degrees      Strength   Overall Strength Comments UEs 3+/5 throughout, LEs 4-/5 throughout, grip Rt 47lb, Lt 29lb      Transfers   Transfers Independent with all Transfers      Ambulation/Gait   Ambulation/Gait Yes    Ambulation/Gait Assistance 6: Modified independent (Device/Increase time)    Ambulation Distance (Feet) 245 Feet    Assistive device Straight cane   in Rt UE   Gait Pattern Step-through pattern;Decreased trunk rotation;Narrow base of support;Decreased step length - left;Decreased step length - right    Ambulation Surface Level    Stairs Yes    Stairs Assistance 6: Modified independent (Device/Increase time)    Stair Management Technique Two rails;Alternating pattern    Number of Stairs 4    Height of Stairs 6      6 minute walk test results    Endurance additional comments 3' walk test with [facility] covering 245'      Balance   Balance Assessed Yes      Standardized Balance Assessment   Standardized Balance Assessment Five Times Sit to Stand;Timed Up and Go Test;Berg Balance Test    Five times sit to stand comments  16 sec no hands      Berg Balance Test   Sit to Stand Able to stand without using hands and stabilize independently    Standing Unsupported Able to stand safely 2 minutes    Sitting with Back Unsupported but Feet Supported on Floor or Stool Able to sit safely and securely 2 minutes    Stand to Sit Sits  safely with minimal use of hands    Transfers Able to transfer safely, minor use of hands    Standing Unsupported with Eyes Closed Able to stand 10 seconds safely    Standing Unsupported with Feet Together Able to place feet together independently and stand 1 minute safely    From Standing, Reach Forward with Outstretched Arm Can reach forward >12 cm safely (5")    From Standing Position, Pick up Object from Floor Able to pick up shoe safely and easily    From Standing Position, Turn to Look Behind Over each Shoulder Looks behind one side only/other side shows less weight shift    Turn 360 Degrees Able to turn 360 degrees safely in 4 seconds or less    Standing Unsupported, Alternately Place Feet on Step/Stool Able to stand independently and safely and complete 8 steps in 20 seconds    Standing Unsupported, One Foot in [employer] balance while stepping or standing    Standing on One Leg Unable to try or needs assist to prevent fall    Total Score 46    Berg comment: 46/56      Timed Up and Go Test   TUG Normal TUG    Normal TUG (seconds) 15    TUG Comments no SPC use      High Level Balance   High Level Balance Comments --      Functional Gait  Assessment   Gait assessed  Yes    Gait Level Surface Walks 20 ft in less than 7 sec but greater  than 5.5 sec, uses assistive device, slower speed, mild gait deviations, or deviates 6-10 in outside of the 12 in walkway width.    Change in Gait Speed Makes only minor adjustments to walking speed, or accomplishes a change in speed with significant gait deviations, deviates 10-15 in outside the 12 in walkway width, or changes speed but loses balance but is able to recover and continue walking.    Gait with Horizontal Head Turns Performs head turns with moderate changes in gait velocity, slows down, deviates 10-15 in outside 12 in walkway width but recovers, can continue to walk.    Gait with Vertical Head Turns Performs task with moderate change in  gait velocity, slows down, deviates 10-15 in outside 12 in walkway width but recovers, can continue to walk.    Gait and Pivot Turn Pivot turns safely in greater than 3 sec and stops with no loss of balance, or pivot turns safely within 3 sec and stops with mild imbalance, requires small steps to catch balance.    Step Over Obstacle Is able to step over one shoe box (4.5 in total height) but must slow down and adjust steps to clear box safely. May require verbal cueing.    Gait with Narrow Base of Support Ambulates 7-9 steps.   heavy use of SPC, unable to w/o cane   Gait with Eyes Closed Walks 20 ft, slow speed, abnormal gait pattern, evidence for imbalance, deviates 10-15 in outside 12 in walkway width. Requires more than 9 sec to ambulate 20 ft.    Ambulating Backwards Walks 20 ft, slow speed, abnormal gait pattern, evidence for imbalance, deviates 10-15 in outside 12 in walkway width.    Steps Alternating feet, must use rail.    Total Score 14    FGA comment: 14/30                        Objective measurements completed on examination: See above findings.       [facility] Adult PT Treatment/Exercise - 08/22/21 0001       Exercises   Exercises Knee/Hip      Knee/Hip Exercises: Seated   Sit to Sand 2 sets;5 reps;without UE support                     PT Education - 08/22/21 1103     Education Details [facility]: [facility]    Person(s) Educated Patient    Methods Explanation;Demonstration;Handout    Comprehension Verbalized understanding;Returned demonstration              PT Short Term Goals - 08/22/21 1801       PT SHORT TERM GOAL #1   Title Pt will be safe and ind with HEP for functional strength training and balance.    Time 4    Period Weeks    Status New    Target Date 09/19/21      PT SHORT TERM GOAL #2   Title Pt will be able to particiapte in 6' walk test covering at least 500' using SPC without LOB.    Baseline 3' walk test 84' with  SPC at initial eval    Time 4    Period Weeks    Status New    Target Date 09/19/21      PT SHORT TERM GOAL #3   Title Pt will achieve at least 4-/5 strength score for UEs.    Baseline 3+ bil  Time 4    Status New    Target Date 09/19/21      PT SHORT TERM GOAL #4   Title Pt will improve 5x sit to stand to </= 14"    Baseline 16"    Time 4    Period Weeks    Status New    Target Date 09/19/21      PT SHORT TERM GOAL #5   Title Pt will be able to perform SLS at counter with light single UE support x 30" on Rt and Lt LE    Time 4    Period Weeks    Status New    Target Date 09/19/21               PT Long Term Goals - 08/22/21 1806       PT LONG TERM GOAL #1   Title Pt will improve 5x sit<>stand to less than or equal to 12 sec for improved functional strength and demo of reduced fall risk.    Baseline 16"    Time 8    Period Weeks    Status New    Target Date 10/17/21      PT LONG TERM GOAL #2   Title Pt will improve FGA to score of at least 20/30 to demo less fall risk.    Baseline 14/30    Time 8    Period Weeks    Status New    Target Date 10/17/21      PT LONG TERM GOAL #3   Title Pt will demo safe step up and step down strategy from curb using SPC to reduce fall risk in the community.    Time 8    Period Weeks    Status New    Target Date 10/17/21      PT LONG TERM GOAL #4   Title Pt will be able to demo forward and lateral hurdle step over and back and alternating march taps to high cones without LOB to demo improved dynamic balance and safety with stepping strategies and brief SLS tasks.    Time 8    Period Weeks    Status New    Target Date 10/17/21      PT LONG TERM GOAL #5   Title Pt will be ind with complete HEP and verbalize understanding of importance to continue for maintained strength, balance and safety.    Time 8    Period Weeks    Status New    Target Date 10/17/21      Additional Long Term Goals   Additional Long Term Goals  Yes      PT LONG TERM GOAL #6   Title Pt will achieve at least 4/5 strength in bil UEs and 4+/5 strength in bil LEs for improved functional tasks such as stairs, transfers, reaching, carrying, and squatting.    Time 8    Period Weeks    Status New    Target Date 10/17/21                    Plan - 08/22/21 1814     Clinical Impression Statement Pt is a pleasant 71yo female with complex medical history referred to OPPT for balance disorder.  She has a history of numerous falls with fractures, some requiring hardware fixation (Lt shoulder ORIF, bil hip replacements).  She has Multiple System Atrohpy (MSA), cervical and lumbar spondylosis, osteoporosis and history of multiple TIAs.  She has  chronic back pain which ranges from 4-9/10.  She uses a SPC in the community and "furniture/wall/grab bar surfs" in the home.  She denies falls in the last 6 months but does have altered activity level due to fear of falling.  Gait pattern is step through with short stride length and lack of trunk rotation.  Bil shoulder flexion is limited secondary to past ORIF, ranging 95-100 deg.  UE strength is 3+/5 throughout with weak grip strength bil.  LE strength is 4-/5 throughout.  She is ind with all transfers and can squat to reach object on floor and return to stand ind without UE assist.  She uses alternating step pattern on stairs with bil handrails.  5x sit to stand is 16", TUG is 15 sec without SPC, FGA is 14/30, and BERG is 46/56, demo'ing measurable signif fall risk.  Pt's goals are to get stronger throughout, improve her balance, and improve her walking tolerance.  She is an excellent candidate for skilled PT to address findings from evaluation to improve safety and overall function.    Personal Factors and Comorbidities Comorbidity 1;Comorbidity 2;Time since onset of injury/illness/exacerbation;Comorbidity 3+    Comorbidities Hx of multiple falls with fractures, osteoporosis, Hx of TIAs.     Examination-Activity Limitations Carry;Lift;Locomotion Level;Reach Overhead;Stairs;Squat;Bend    Examination-Participation Restrictions Community Activity;Shop;Cleaning    Stability/Clinical Decision Making Stable/Uncomplicated    Clinical Decision Making Low    Rehab Potential Good    PT Frequency 2x / week    PT Duration 8 weeks    PT Treatment/Interventions ADLs/Self Care Home Management;Moist Heat;Gait training;Stair training;Functional mobility training;Therapeutic activities;Therapeutic exercise;Balance training;Neuromuscular re-education;Patient/family education;Manual techniques;Passive range of motion;Joint Manipulations    PT Next Visit Plan NuStep, 3-6' walk test, progress [employer] HEP over next few visits: counter balance (SLS, march, sidestepping, tandem gait), UE yellow band as tol (bicep curls, tricep pulldown, shoulder ext, row), sit to stand, LAQ    PT Home Exercise Plan Access Code: [facility]    Consulted and Agree with Plan of Care Patient             Patient will benefit from skilled therapeutic intervention in order to improve the following deficits and impairments:  Abnormal gait, Decreased range of motion, Pain, Impaired UE functional use, Difficulty walking, Decreased balance, Decreased activity tolerance, Decreased strength, Decreased coordination, Decreased endurance  Visit Diagnosis: Other abnormalities of gait and mobility - Plan: PT plan of care cert/re-cert  Muscle weakness (generalized) - Plan: PT plan of care cert/re-cert  Repeated falls - Plan: PT plan of care cert/re-cert     Problem List Patient Active Problem List   Diagnosis Date Noted   Multiple system atrophy ([place]) 01/12/2021   Pain of right hip joint 09/28/2020   Pain in joint of left elbow 04/25/2020   Encounter for orthopedic follow-up care 04/19/2020   Closed comminuted fracture of proximal ulna 04/04/2020   Closed fracture of base of fifth metacarpal bone of left hand 03/29/2020    Closed fracture of proximal end of left humerus 03/29/2020   Closed fracture of olecranon process of left ulna 03/29/2020   Cervical radiculitis 08/11/2019   Lumbar spondylosis 01/11/2019   Urinary tract infectious disease 07/03/2018   Fractured nasal bones 07/03/2018   Knee pain 07/20/8249   Metabolic acidosis, normal anion gap (NAG) 07/03/2018   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   Long-term current use of opiate analgesic 09/08/2017   Recurrent falls 04/16/2017   History of total hip replacement, 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  History of 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 joint 04/16/2017   At risk for adverse drug event 04/07/2017   Postoperative anemia due to acute blood loss 04/04/2017   Delirium 04/04/2017   Osteoporosis 04/02/2017   Closed fracture of neck of left femur ([place]) 03/31/2017   Contusion of scalp 03/31/2017   Fall in home 03/31/2017   Hip fracture (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   History of transient ischemic attack 02/19/2017   Left carotid artery stenosis 02/19/2017   Protein calorie malnutrition ([place]) 02/18/2017   Deficiency of macronutrients 02/18/2017   Fall 02/17/2017   Chest pain 09/26/2016   Diverticulitis of sigmoid colon 04/25/2016   Infection due to ESBL-producing Escherichia coli/Diverticular Abscess 03/15/2016   History of infection due to ESBL Escherichia coli 03/15/2016   Diverticulitis 03/12/2016   Lesion of pancreas 03/12/2016   History of chest pain 03/12/2016   Hypokalemia 03/12/2016   Angina, class IV (HCC) - chest tightness and pressure with dyspnea with minimal exertion 02/17/2016    Class: Question of   Transient ischemic attack 12/26/2015   Dyspnea on exertion 12/26/2015   Right hemiparesis ([place]) 12/17/2015   Ataxia 12/17/2015   Fracture of shoulder 02/15/2015   Chronic headache 10/11/2013   Chronic headache disorder 10/11/2013   Asthenia 08/12/2013   Dizziness and  giddiness 08/12/2013   Abnormal gait 07/11/2013   Hyperlipidemia 05/07/2007   Migraine 05/07/2007   Premature beats 05/07/2007   Gastroesophageal reflux disease 05/07/2007   Diverticulosis of colon 05/07/2007   Degeneration of lumbar intervertebral disc 05/07/2007   Disorder of skeletal system 05/07/2007   Depressive disorder 03/24/2007   Hemorrhoids 03/24/2007   Allergic rhinitis 03/24/2007   Lumbar pain 03/24/2007   MIGRAINES, HX OF 03/24/2007   Baruch Merl, PT 08/22/21 6:32 PM   [place] @ [place], [place], [address] Phone: [phone]   Fax:  [phone]  Name: Dorothy White MRN: [id number] Date of Birth: [birth year]

## 2021-08-27 ENCOUNTER — Ambulatory Visit: Payer: Medicare Other | Admitting: Physical Therapy

## 2021-08-27 ENCOUNTER — Other Ambulatory Visit: Payer: Self-pay

## 2021-08-27 DIAGNOSIS — R296 Repeated falls: Secondary | ICD-10-CM

## 2021-08-27 DIAGNOSIS — R2689 Other abnormalities of gait and mobility: Secondary | ICD-10-CM

## 2021-08-27 DIAGNOSIS — M6281 Muscle weakness (generalized): Secondary | ICD-10-CM

## 2021-08-27 NOTE — Therapy (Signed)
[facility] @ [place], [place], [address] Phone: [phone]   Fax:  [phone]  Physical Therapy Treatment  Patient Details  Name: Dorothy White MRN: [id number] Date of Birth: 02-19-1951 Referring Provider (PT): Jonathon Jordan, MD   Encounter Date: 08/27/2021   PT End of Session - 08/27/21 1638     Visit Number 2    Date for PT Re-Evaluation 10/17/21    Authorization Type [employer] Medicare    Progress Note Due on Visit 10    PT Start Time 1445    PT Stop Time 1529    PT Time Calculation (min) 44 min    Activity Tolerance Patient tolerated treatment well             Past Medical History:  Diagnosis Date   Anemia    ?   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 25% mLAD, otherwise no sig disease 01/2016 cath.   NICM (nonischemic cardiomyopathy) ([place])    a. EF 40-45% by echo 09/4740 at time of complicated migraine/neuro sx, 55-65% at time of cath 01/2016   Stroke [facility]) 06/2013   "mini" stroke , ?possibly hemaplegic migraine per pt    Past Surgical History:  Procedure Laterality Date   ANTERIOR APPROACH HEMI HIP ARTHROPLASTY Left 04/01/2017   Procedure: LEFT DIRECT ANTERIOR TOTAL HIP REPLACEMENT;  Surgeon: Leandrew Koyanagi, MD;  Location: [place];  Service: Orthopedics;  Laterality: Left;  LEFT DIRECT ANTERIOR TOTAL HIP REPLACEMENT   AUGMENTATION MAMMAPLASTY  1980   BASAL CELL CARCINOMA EXCISION     "tip of my nose"    BUNIONECTOMY Bilateral 10/2003   CARDIAC CATHETERIZATION N/A 02/20/2016   Procedure: Left Heart Cath and Coronary Angiography;  Surgeon: Jettie Booze, MD;  Location: [facility];  Service: Cardiovascular;  Laterality: N/A;   COLONOSCOPY     DILATION AND CURETTAGE OF UTERUS     FRACTURE SURGERY     IR GENERIC HISTORICAL  03/18/2016   IR SINUS/FIST TUBE CHK-NON GI 03/18/2016 Aletta Edouard, MD [facility]   LAPAROSCOPIC SIGMOID COLECTOMY N/A 04/25/2016   Procedure: LAPAROSCOPIC SIGMOID COLECTOMY;  Surgeon: Stark Klein, MD;  Location: [place];  Service: General;  Laterality: N/A;   OOPHORECTOMY Bilateral ~ 1999   ORIF HUMERUS FRACTURE Right 02/15/2015   Procedure: OPEN REDUCTION INTERNAL FIXATION (ORIF) PROXIMAL HUMERUS FRACTURE;  Surgeon: Netta Cedars, MD;  Location: [facility];  Service: Orthopedics;  Laterality: Right;   ORIF HUMERUS FRACTURE Left 04/04/2020   ORIF HUMERUS FRACTURE Left 04/04/2020   Procedure: OPEN REDUCTION INTERNAL FIXATION (ORIF) PROXIMAL HUMERUS FRACTURE;  Surgeon: Iran Planas, MD;  Location: [place];  Service: Orthopedics;  Laterality: Left;  regional block   ORIF ULNAR FRACTURE Left 04/04/2020   ORIF ULNAR FRACTURE Left 04/04/2020   Procedure: OPEN REDUCTION INTERNAL FIXATION (ORIF) ULNAR FRACTURE;  Surgeon: Caralyn Guile,  Josph Macho, MD;  Location: [place];  Service: Orthopedics;  Laterality: Left;  with regional block   [place] Right 08/11/2020   Procedure: TOTAL HIP ARTHROPLASTY ANTERIOR APPROACH;  Surgeon: Rod Can, MD;  Location: [facility];  Service: Orthopedics;  Laterality: Right;   TUBAL LIGATION  ~ [place]  ~ 1998    There were no vitals filed for this visit.   Subjective Assessment - 08/27/21 1449     Subjective I'm not real good with my balance today.  I'm off.  I took some Benadryl earlier, that could be it.    Pertinent History cervical and lumbar spondylosis    Currently in Pain? Yes     Pain Score 6     Pain Location Back                               [facility] Adult PT Treatment/Exercise - 08/27/21 0001       Neuro Re-ed    Neuro Re-ed Details  staggered stand with head movements, UE movements, eyes closed; stepping to floor circles with speed, reaching circles on counter; toe taps to step to challenge balance      Knee/Hip Exercises: Standing   SLS with light UE support right/left    Other Standing Knee Exercises next to counter:travelling marches, sidestepping, backwards    Other Standing Knee Exercises reaching overhead/heel raises      Knee/Hip Exercises: Seated   Sit to Sand 1 set;10 reps;without UE support      Shoulder Exercises: Seated   Other Seated Exercises yellow band biceps 20x      Shoulder Exercises: Standing   Extension Both;Strengthening;10 reps;Theraband    Theraband Level (Shoulder Extension) Level 1 (Yellow)    Extension Limitations narrow base of support to challenge balance    Row 10 reps;Strengthening;Both;Theraband    Theraband Level (Shoulder Row) Level 1 (Yellow)    Row Limitations narrow base of support to challenge balance                     PT Education - 08/27/21 1632     Education Details counter ex's; band rows, extensions, biceps    Person(s) Educated Patient    Methods Explanation;Demonstration;Handout    Comprehension Verbalized understanding;Returned demonstration              PT Short Term Goals - 08/22/21 1801       PT SHORT TERM GOAL #1   Title Pt will be safe and ind with HEP for functional strength training and balance.    Time 4    Period Weeks    Status New    Target Date 09/19/21      PT SHORT TERM GOAL #2   Title Pt will be able to particiapte in 6' walk test covering at least 500' using SPC without LOB.    Baseline 3' walk test 35' with SPC at initial eval    Time 4    Period Weeks    Status New    Target Date 09/19/21      PT SHORT TERM GOAL #3   Title Pt  will achieve at least 4-/5 strength score for UEs.    Baseline 3+ bil    Time 4    Status New    Target Date 09/19/21      PT  SHORT TERM GOAL #4   Title Pt will improve 5x sit to stand to </= 14"    Baseline 16"    Time 4    Period Weeks    Status New    Target Date 09/19/21      PT SHORT TERM GOAL #5   Title Pt will be able to perform SLS at counter with light single UE support x 30" on Rt and Lt LE    Time 4    Period Weeks    Status New    Target Date 09/19/21               PT Long Term Goals - 08/22/21 1806       PT LONG TERM GOAL #1   Title Pt will improve 5x sit<>stand to less than or equal to 12 sec for improved functional strength and demo of reduced fall risk.    Baseline 16"    Time 8    Period Weeks    Status New    Target Date 10/17/21      PT LONG TERM GOAL #2   Title Pt will improve FGA to score of at least 20/30 to demo less fall risk.    Baseline 14/30    Time 8    Period Weeks    Status New    Target Date 10/17/21      PT LONG TERM GOAL #3   Title Pt will demo safe step up and step down strategy from curb using SPC to reduce fall risk in the community.    Time 8    Period Weeks    Status New    Target Date 10/17/21      PT LONG TERM GOAL #4   Title Pt will be able to demo forward and lateral hurdle step over and back and alternating march taps to high cones without LOB to demo improved dynamic balance and safety with stepping strategies and brief SLS tasks.    Time 8    Period Weeks    Status New    Target Date 10/17/21      PT LONG TERM GOAL #5   Title Pt will be ind with complete HEP and verbalize understanding of importance to continue for maintained strength, balance and safety.    Time 8    Period Weeks    Status New    Target Date 10/17/21      Additional Long Term Goals   Additional Long Term Goals Yes      PT LONG TERM GOAL #6   Title Pt will achieve at least 4/5 strength in bil UEs and 4+/5 strength in bil LEs for  improved functional tasks such as stairs, transfers, reaching, carrying, and squatting.    Time 8    Period Weeks    Status New    Target Date 10/17/21                   Plan - 08/27/21 1632     Clinical Impression Statement The patient presents with [facility] and is leaning fairly heavily on it.  She reports her balance has been "off" today possibly from taking a Benadryl this morning.  Initially she is wobbly with standing/counter ex's but with repetition and as the session continued she was able to withstand moderate challenges to static and dynamic balance well with only CGA for safety.  Backwards walking and single limb stance activities were the most difficult for her.  She was able to stabilize well without her cane to do upper body band ex's.  Encouraged to perform home ex's next to her home "ballet bar" or kitchen counter for safety at home.    Personal Factors and Comorbidities Comorbidity 1;Comorbidity 2;Time since onset of injury/illness/exacerbation;Comorbidity 3+    Comorbidities Hx of multiple falls with fractures, osteoporosis, Hx of TIAs.    Examination-Activity Limitations Carry;Lift;Locomotion Level;Reach Overhead;Stairs;Squat;Bend    Examination-Participation Restrictions Community Activity;Shop;Cleaning    Rehab Potential Good    PT Frequency 2x / week    PT Duration 8 weeks    PT Treatment/Interventions ADLs/Self Care Home Management;Moist Heat;Gait training;Stair training;Functional mobility training;Therapeutic activities;Therapeutic exercise;Balance training;Neuromuscular re-education;Patient/family education;Manual techniques;Passive range of motion;Joint Manipulations    PT Next Visit Plan NuStep, 3-6' walk test, progress [employer] HEP over next few visits: counter balance (SLS, march, sidestepping, tandem gait), backwards walking; single limb standing balance;, sit to stand from progressively lower height (has trouble getting off low couch at home), LAQ    PT Home  Exercise Plan Access Code: [facility]             Patient will benefit from skilled therapeutic intervention in order to improve the following deficits and impairments:  Abnormal gait, Decreased range of motion, Pain, Impaired UE functional use, Difficulty walking, Decreased balance, Decreased activity tolerance, Decreased strength, Decreased coordination, Decreased endurance  Visit Diagnosis: Other abnormalities of gait and mobility  Muscle weakness (generalized)  Repeated falls     Problem List Patient Active Problem List   Diagnosis Date Noted   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   Pain of right hip joint 09/28/2020   Pain in joint of left elbow 04/25/2020   Encounter for orthopedic follow-up care 04/19/2020   Closed comminuted fracture of proximal ulna 04/04/2020   Closed fracture of base of fifth metacarpal bone of left hand 03/29/2020   Closed fracture of proximal end of left humerus 03/29/2020   Closed fracture of olecranon process of left ulna 03/29/2020   Cervical radiculitis 08/11/2019   Lumbar spondylosis 01/11/2019   Urinary tract infectious disease 07/03/2018   Fractured nasal bones 07/03/2018   Knee pain 10/62/6948   Metabolic acidosis, normal anion gap (NAG) 07/03/2018   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   Long-term current use of opiate analgesic 09/08/2017   Recurrent falls 04/16/2017   History of total hip replacement, 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) 02/18/2017   Deficiency of macronutrients 02/18/2017   Fall 02/17/2017   Chest pain 09/26/2016   Diverticulitis of sigmoid colon 04/25/2016   Infection due to ESBL-producing Escherichia coli/Diverticular Abscess 03/15/2016   History of infection due to ESBL Escherichia coli 03/15/2016   Diverticulitis 03/12/2016   Lesion of pancreas 03/12/2016   History of chest pain 03/12/2016   Hypokalemia 03/12/2016   Angina, class IV (HCC) - chest tightness and pressure with dyspnea with minimal exertion 02/17/2016    Class: Question of  Transient ischemic attack 12/26/2015   Dyspnea on exertion 12/26/2015   Right hemiparesis ([place]) 12/17/2015   Ataxia 12/17/2015   Fracture of shoulder 02/15/2015   Chronic headache 10/11/2013   Chronic headache disorder 10/11/2013   Asthenia 08/12/2013   Dizziness and giddiness 08/12/2013   Abnormal gait 07/11/2013   Hyperlipidemia 05/07/2007   Migraine 05/07/2007   Premature beats 05/07/2007   Gastroesophageal reflux disease 05/07/2007   Diverticulosis of colon 05/07/2007   Degeneration of lumbar intervertebral disc 05/07/2007   Disorder of skeletal system 05/07/2007   Depressive disorder 03/24/2007   Hemorrhoids 03/24/2007   Allergic rhinitis 03/24/2007   Lumbar pain 03/24/2007   MIGRAINES, HX OF 03/24/2007   Ruben Im, PT 08/27/21 4:46 PM Phone: [phone] Fax: [phone]  Alvera Singh, PT 08/27/2021, 4:46 PM  [facility] @ [place], [place], [address] Phone: [phone]   Fax:  [phone]  Name: Dorothy White MRN: [id number] Date of Birth: [birth year]

## 2021-08-27 NOTE — Patient Instructions (Signed)
Access Code: [facility] URL: [email or web address] Date: 08/27/2021 Prepared by: Ruben Im  Exercises Sit to Stand - 3 x daily - 7 x weekly - 1 sets - 5 reps Side Stepping with Counter Support - 1 x daily - 7 x weekly - 1 sets - 10 reps Backward Walking with Counter Support - 1 x daily - 7 x weekly - 1 sets - 10 reps Standing Single Leg Stance with Counter Support - 1 x daily - 7 x weekly - 1 sets - 10 reps Standing Row with Anchored Resistance - 1 x daily - 7 x weekly - 1 sets - 10 reps Shoulder Extension with Resistance - 1 x daily - 7 x weekly - 1 sets - 10 reps Seated Single Arm Elbow Flexion with Resistance - 1 x daily - 7 x weekly - 3 sets - 10 reps

## 2021-08-29 ENCOUNTER — Ambulatory Visit: Payer: Medicare Other | Attending: Family Medicine | Admitting: Physical Therapy

## 2021-08-29 ENCOUNTER — Other Ambulatory Visit: Payer: Self-pay

## 2021-08-29 ENCOUNTER — Encounter: Payer: Self-pay | Admitting: Physical Therapy

## 2021-08-29 DIAGNOSIS — R2689 Other abnormalities of gait and mobility: Secondary | ICD-10-CM | POA: Diagnosis not present

## 2021-08-29 DIAGNOSIS — R296 Repeated falls: Secondary | ICD-10-CM | POA: Insufficient documentation

## 2021-08-29 DIAGNOSIS — M6281 Muscle weakness (generalized): Secondary | ICD-10-CM | POA: Diagnosis not present

## 2021-08-29 NOTE — Therapy (Signed)
[facility] @ [address] [place], [place], [address] Phone: [phone]   Fax:  [phone]  Physical Therapy Treatment  Patient Details  Name: Dorothy White MRN: [id number] Date of Birth: 07-Oct-1950 Referring Provider (PT): Jonathon Jordan, MD   Encounter Date: 08/29/2021   PT End of Session - 08/29/21 1400     Visit Number 3    Date for PT Re-Evaluation 10/17/21    Authorization Type [employer] Medicare    Progress Note Due on Visit 10    PT Start Time 1400    PT Stop Time 1438    PT Time Calculation (min) 38 min    Activity Tolerance Patient tolerated treatment well    Behavior During Therapy [facility] for tasks assessed/performed             Past Medical History:  Diagnosis Date   Anemia    ?   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 25% mLAD, otherwise no sig disease 01/2016 cath.   NICM (nonischemic cardiomyopathy) ([place])    a. EF 40-45% by echo 12/9627 at time of complicated migraine/neuro sx, 55-65% at time of cath 01/2016   Stroke [facility]) 06/2013   "mini" stroke , ?possibly hemaplegic migraine per pt    Past Surgical History:  Procedure Laterality Date   ANTERIOR APPROACH HEMI HIP ARTHROPLASTY Left 04/01/2017   Procedure: LEFT DIRECT ANTERIOR TOTAL HIP REPLACEMENT;  Surgeon: Leandrew Koyanagi, MD;  Location: [facility];  Service: Orthopedics;  Laterality: Left;  LEFT DIRECT ANTERIOR TOTAL HIP REPLACEMENT   AUGMENTATION MAMMAPLASTY  1980    BASAL CELL CARCINOMA EXCISION     "tip of my nose"   BUNIONECTOMY Bilateral 10/2003   CARDIAC CATHETERIZATION N/A 02/20/2016   Procedure: Left Heart Cath and Coronary Angiography;  Surgeon: Jettie Booze, MD;  Location: [facility];  Service: Cardiovascular;  Laterality: N/A;   COLONOSCOPY     DILATION AND CURETTAGE OF UTERUS     FRACTURE SURGERY     IR GENERIC HISTORICAL  03/18/2016   IR SINUS/FIST TUBE CHK-NON GI 03/18/2016 Aletta Edouard, MD [facility]INTERV RAD   LAPAROSCOPIC SIGMOID COLECTOMY N/A 04/25/2016   Procedure: LAPAROSCOPIC SIGMOID COLECTOMY;  Surgeon: Stark Klein, MD;  Location: [place];  Service: General;  Laterality: N/A;   OOPHORECTOMY Bilateral ~ 1999   ORIF HUMERUS FRACTURE Right 02/15/2015   Procedure: OPEN REDUCTION INTERNAL FIXATION (ORIF) PROXIMAL HUMERUS FRACTURE;  Surgeon: Netta Cedars, MD;  Location: [facility];  Service: Orthopedics;  Laterality: Right;   ORIF HUMERUS FRACTURE Left 04/04/2020   ORIF HUMERUS FRACTURE Left 04/04/2020   Procedure: OPEN REDUCTION INTERNAL FIXATION (ORIF) PROXIMAL HUMERUS FRACTURE;  Surgeon: Iran Planas, MD;  Location: [place];  Service: Orthopedics;  Laterality: Left;  regional block   ORIF ULNAR FRACTURE Left 04/04/2020   ORIF ULNAR FRACTURE Left 04/04/2020   Procedure:  OPEN REDUCTION INTERNAL FIXATION (ORIF) ULNAR FRACTURE;  Surgeon: Iran Planas, MD;  Location: [facility];  Service: Orthopedics;  Laterality: Left;  with regional block   [place] Right 08/11/2020   Procedure: TOTAL HIP ARTHROPLASTY ANTERIOR APPROACH;  Surgeon: Rod Can, MD;  Location: [facility];  Service: Orthopedics;  Laterality: Right;   TUBAL LIGATION  ~ [place]  ~ 1998    There were no vitals filed for this visit.   Subjective Assessment - 08/29/21 1400     Subjective I am feeling better today - I think the Benadryl affected me last time.  I have done some of the HEP and it is going well.     Pertinent History cervical and lumbar spondylosis    How long can you walk comfortably? 20 min    Patient Stated Goals reduce stiffness, work on strength and balance    Currently in Pain? Yes    Pain Score 4     Pain Location Back                               [facility] Adult PT Treatment/Exercise - 08/29/21 0001       Exercises   Exercises Shoulder;Knee/Hip;Lumbar      Lumbar Exercises: Aerobic   Nustep L3 x 4' PT present to monitor and discuss plan for session      Knee/Hip Exercises: Standing   Hip Flexion Limitations march taps to 6" step, single UE support x 20 alt Rt/Lt    Forward Step Up Both;1 set;5 reps;Hand Hold: 1;Step Height: 6"    SLS 3-point taps x 5 rounds light UE support on counter      Knee/Hip Exercises: Seated   Long Arc Quad Right;Left;1 set;10 reps    Marching Strengthening;Both;1 set;10 reps;Weights    Marching Limitations hold 5lb on thigh    Sit to Sand 1 set;10 reps;without UE support      Shoulder Exercises: Seated   Other Seated Exercises yellow band biceps 20x      Shoulder Exercises: Standing   Extension Both;Strengthening;10 reps;Theraband    Theraband Level (Shoulder Extension) Level 1 (Yellow)    Extension Limitations narrow base of support to challenge balance    Row Strengthening;Both;Theraband;20 reps    Theraband Level (Shoulder Row) Level 1 (Yellow)    Row Limitations narrow base of support to challenge balance                 Balance Exercises - 08/29/21 0001       Balance Exercises: Standing   Tandem Stance Eyes closed;Other (comment)   exaggerated arm swings, head turns   SLS Intermittent upper extremity support;1 rep;20 secs    Stepping Strategy Lateral;Anterior;5 reps   with weight shift and shallow lunge   Gait with Head Turns Forward;2 reps   down long hall and back, gait belt close supervision   Tandem Gait Forward;Retro;2 reps;Upper extremity support    Other Standing Exercises Comments traveling  marching along counter fwd/bwd x 1 lap each                  PT Short Term Goals - 08/22/21 1801       PT SHORT TERM GOAL #1   Title Pt will be safe and ind with HEP for functional strength training and balance.    Time 4  Period Weeks    Status New    Target Date 09/19/21      PT SHORT TERM GOAL #2   Title Pt will be able to particiapte in 6' walk test covering at least 500' using SPC without LOB.    Baseline 3' walk test 4' with SPC at initial eval    Time 4    Period Weeks    Status New    Target Date 09/19/21      PT SHORT TERM GOAL #3   Title Pt will achieve at least 4-/5 strength score for UEs.    Baseline 3+ bil    Time 4    Status New    Target Date 09/19/21      PT SHORT TERM GOAL #4   Title Pt will improve 5x sit to stand to </= 14"    Baseline 16"    Time 4    Period Weeks    Status New    Target Date 09/19/21      PT SHORT TERM GOAL #5   Title Pt will be able to perform SLS at counter with light single UE support x 30" on Rt and Lt LE    Time 4    Period Weeks    Status New    Target Date 09/19/21               PT Long Term Goals - 08/22/21 1806       PT LONG TERM GOAL #1   Title Pt will improve 5x sit<>stand to less than or equal to 12 sec for improved functional strength and demo of reduced fall risk.    Baseline 16"    Time 8    Period Weeks    Status New    Target Date 10/17/21      PT LONG TERM GOAL #2   Title Pt will improve FGA to score of at least 20/30 to demo less fall risk.    Baseline 14/30    Time 8    Period Weeks    Status New    Target Date 10/17/21      PT LONG TERM GOAL #3   Title Pt will demo safe step up and step down strategy from curb using SPC to reduce fall risk in the community.    Time 8    Period Weeks    Status New    Target Date 10/17/21      PT LONG TERM GOAL #4   Title Pt will be able to demo forward and lateral hurdle step over and back and alternating march taps to high cones without  LOB to demo improved dynamic balance and safety with stepping strategies and brief SLS tasks.    Time 8    Period Weeks    Status New    Target Date 10/17/21      PT LONG TERM GOAL #5   Title Pt will be ind with complete HEP and verbalize understanding of importance to continue for maintained strength, balance and safety.    Time 8    Period Weeks    Status New    Target Date 10/17/21      Additional Long Term Goals   Additional Long Term Goals Yes      PT LONG TERM GOAL #6   Title Pt will achieve at least 4/5 strength in bil UEs and 4+/5 strength in bil LEs for improved functional tasks such as stairs, transfers, reaching,  carrying, and squatting.    Time 8    Period Weeks    Status New    Target Date 10/17/21                   Plan - 08/29/21 1438     Clinical Impression Statement Pt reported good tolerance of first follow up session.  PT added a few seated LE strength exericses today (LAQ and weighted march) and standing LE strength exercises (march taps to 6" step and step ups x 5 reps to 6" step).  Pt reviewed counter balance and gait challenges and added fwd/lat step with weight shift/lunge and gait with head turns providing close supervision and gait belt for safety.  Gait is very slow with head turns but Pt able to avoid deviation of gait path today.  Continue along POC with ongoing assessment of tolerance of selected interventions.    Comorbidities Hx of multiple falls with fractures, osteoporosis, Hx of TIAs.    PT Frequency 2x / week    PT Duration 8 weeks    PT Treatment/Interventions ADLs/Self Care Home Management;Moist Heat;Gait training;Stair training;Functional mobility training;Therapeutic activities;Therapeutic exercise;Balance training;Neuromuscular re-education;Patient/family education;Manual techniques;Passive range of motion;Joint Manipulations    PT Next Visit Plan NuStep, continue gait and balance challenges, SLS, sit to stand from progressively lower  surface    PT Home Exercise Plan Access Code: [place] and Agree with Plan of Care Patient             Patient will benefit from skilled therapeutic intervention in order to improve the following deficits and impairments:     Visit Diagnosis: Other abnormalities of gait and mobility  Muscle weakness (generalized)  Repeated falls     Problem List Patient Active Problem List   Diagnosis Date Noted   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   Pain of right hip joint 09/28/2020   Pain in joint of left elbow 04/25/2020   Encounter for orthopedic follow-up care 04/19/2020   Closed comminuted fracture of proximal ulna 04/04/2020   Closed fracture of base of fifth metacarpal bone of left hand 03/29/2020   Closed fracture of proximal end of left humerus 03/29/2020   Closed fracture of olecranon process of left ulna 03/29/2020   Cervical radiculitis 08/11/2019   Lumbar spondylosis 01/11/2019   Urinary tract infectious disease 07/03/2018   Fractured nasal bones 07/03/2018   Knee pain 38/04/1750   Metabolic acidosis, normal anion gap (NAG) 07/03/2018   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   Long-term current use of opiate analgesic 09/08/2017   Recurrent falls 04/16/2017   History of total hip replacement, 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   History of 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 joint 04/16/2017   At risk for adverse drug event 04/07/2017   Postoperative anemia due to acute blood loss 04/04/2017   Delirium 04/04/2017   Osteoporosis 04/02/2017   Closed fracture of neck of left femur ([place]) 03/31/2017   Contusion of scalp 03/31/2017   Fall in home 03/31/2017   Hip fracture ([place]) 03/31/2017   Fracture of femur ([place]) 03/31/2017   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   History of transient ischemic attack 02/19/2017   Left carotid artery stenosis 02/19/2017   Protein calorie malnutrition ([place]) 02/18/2017   Deficiency of macronutrients 02/18/2017   Fall 02/17/2017    Chest pain 09/26/2016   Diverticulitis of sigmoid colon 04/25/2016   Infection due to ESBL-producing Escherichia coli/Diverticular Abscess 03/15/2016   History of infection due to ESBL Escherichia  coli 03/15/2016   Diverticulitis 03/12/2016   Lesion of pancreas 03/12/2016   History of chest pain 03/12/2016   Hypokalemia 03/12/2016   Angina, class IV (HCC) - chest tightness and pressure with dyspnea with minimal exertion 02/17/2016    Class: Question of   Transient ischemic attack 12/26/2015   Dyspnea on exertion 12/26/2015   Right hemiparesis ([place]) 12/17/2015   Ataxia 12/17/2015   Fracture of shoulder 02/15/2015   Chronic headache 10/11/2013   Chronic headache disorder 10/11/2013   Asthenia 08/12/2013   Dizziness and giddiness 08/12/2013   Abnormal gait 07/11/2013   Hyperlipidemia 05/07/2007   Migraine 05/07/2007   Premature beats 05/07/2007   Gastroesophageal reflux disease 05/07/2007   Diverticulosis of colon 05/07/2007   Degeneration of lumbar intervertebral disc 05/07/2007   Disorder of skeletal system 05/07/2007   Depressive disorder 03/24/2007   Hemorrhoids 03/24/2007   Allergic rhinitis 03/24/2007   Lumbar pain 03/24/2007   MIGRAINES, HX OF 03/24/2007    Baruch Merl, PT 08/29/21 2:42 PM   [facility] @ [address] [place], [place], [address] Phone: [phone]   Fax:  [phone]  Name: SHRIYA AKER MRN: [id number] Date of Birth: [birth year]

## 2021-09-03 ENCOUNTER — Ambulatory Visit: Payer: Medicare Other | Admitting: Physical Therapy

## 2021-09-03 ENCOUNTER — Telehealth: Payer: Self-pay | Admitting: Physical Therapy

## 2021-09-03 NOTE — Telephone Encounter (Signed)
Pt missed PT on 09/03/21 at 12:30pm.  PT left voicemail for Pt to return call to confirm next appointment.  Genaro Bekker, PT 09/03/21 1:05 PM

## 2021-09-05 ENCOUNTER — Other Ambulatory Visit: Payer: Self-pay

## 2021-09-05 ENCOUNTER — Ambulatory Visit: Payer: Medicare Other

## 2021-09-05 DIAGNOSIS — R296 Repeated falls: Secondary | ICD-10-CM

## 2021-09-05 DIAGNOSIS — R2689 Other abnormalities of gait and mobility: Secondary | ICD-10-CM | POA: Diagnosis not present

## 2021-09-05 DIAGNOSIS — M6281 Muscle weakness (generalized): Secondary | ICD-10-CM

## 2021-09-05 NOTE — Therapy (Signed)
[facility] @ [place], [place], [address] Phone: [phone]   Fax:  [phone]  Physical Therapy Treatment  Patient Details  Name: Dorothy White MRN: [id number] Date of Birth: 02-26-1951 Referring Provider (PT): Jonathon Jordan, MD   Encounter Date: 09/05/2021   PT End of Session - 09/05/21 1405     Visit Number 4    Date for PT Re-Evaluation 10/17/21    Authorization Type [employer] Medicare    Progress Note Due on Visit 10    PT Start Time 1401    PT Stop Time 1442    PT Time Calculation (min) 41 min    Equipment Utilized During Treatment Gait belt    Activity Tolerance Patient tolerated treatment well    Behavior During Therapy [facility] for tasks assessed/performed             Past Medical History:  Diagnosis Date   Anemia    ?   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 25% mLAD, otherwise no sig disease 01/2016 cath.   NICM (nonischemic cardiomyopathy) ([place])    a. EF 40-45% by echo 07/6965 at time of complicated migraine/neuro sx, 55-65% at time of cath 01/2016   Stroke [facility]) 06/2013   "mini" stroke , ?possibly hemaplegic migraine per pt    Past Surgical History:  Procedure Laterality Date   ANTERIOR APPROACH HEMI HIP ARTHROPLASTY Left 04/01/2017   Procedure: LEFT DIRECT ANTERIOR TOTAL HIP REPLACEMENT;  Surgeon: Leandrew Koyanagi, MD;  Location: [place];  Service: Orthopedics;  Laterality: Left;  LEFT DIRECT ANTERIOR TOTAL HIP  REPLACEMENT   AUGMENTATION MAMMAPLASTY  1980   BASAL CELL CARCINOMA EXCISION     "tip of my nose"   BUNIONECTOMY Bilateral 10/2003   CARDIAC CATHETERIZATION N/A 02/20/2016   Procedure: Left Heart Cath and Coronary Angiography;  Surgeon: Jettie Booze, MD;  Location: [facility];  Service: Cardiovascular;  Laterality: N/A;   COLONOSCOPY     DILATION AND CURETTAGE OF UTERUS     FRACTURE SURGERY     IR GENERIC HISTORICAL  03/18/2016   IR SINUS/FIST TUBE CHK-NON GI 03/18/2016 Aletta Edouard, MD [facility]INTERV RAD   LAPAROSCOPIC SIGMOID COLECTOMY N/A 04/25/2016   Procedure: LAPAROSCOPIC SIGMOID COLECTOMY;  Surgeon: Stark Klein, MD;  Location: [place];  Service: General;  Laterality: N/A;   OOPHORECTOMY Bilateral ~ 1999   ORIF HUMERUS FRACTURE Right 02/15/2015   Procedure: OPEN REDUCTION INTERNAL FIXATION (ORIF) PROXIMAL HUMERUS FRACTURE;  Surgeon: Netta Cedars, MD;  Location: [place];  Service: Orthopedics;  Laterality: Right;   ORIF HUMERUS FRACTURE Left 04/04/2020   ORIF HUMERUS FRACTURE Left 04/04/2020   Procedure: OPEN REDUCTION INTERNAL FIXATION (ORIF) PROXIMAL HUMERUS FRACTURE;  Surgeon: Iran Planas, MD;  Location: [place];  Service: Orthopedics;  Laterality: Left;  regional block   ORIF ULNAR FRACTURE Left 04/04/2020  ORIF ULNAR FRACTURE Left 04/04/2020   Procedure: OPEN REDUCTION INTERNAL FIXATION (ORIF) ULNAR FRACTURE;  Surgeon: Iran Planas, MD;  Location: [place];  Service: Orthopedics;  Laterality: Left;  with regional block   [place] Right 08/11/2020   Procedure: TOTAL HIP ARTHROPLASTY ANTERIOR APPROACH;  Surgeon: Rod Can, MD;  Location: [facility];  Service: Orthopedics;  Laterality: Right;   TUBAL LIGATION  ~ [place]  ~ 1998    There were no vitals filed for this visit.   Subjective Assessment - 09/05/21 1406     Subjective Pt states that she is feeling a little sore today, but believes it may be due  to the weather.    Currently in Pain? Yes    Pain Score 4     Pain Location Back    Pain Orientation Right;Left;Lower    Pain Descriptors / Indicators Tightness    Pain Type Chronic pain    Pain Onset More than a month ago    Pain Frequency Constant    Aggravating Factors  unsure    Pain Relieving Factors heat    Effect of Pain on Daily Activities reaching, fine motor tasks due to weak hands    Multiple Pain Sites No                               [facility] Adult PT Treatment/Exercise - 09/05/21 0001       Lumbar Exercises: Aerobic   Nustep L3 x 4' PT present to monitor and discuss plan for session      Knee/Hip Exercises: Standing   Hip Flexion Limitations hold 5lb on thigh    Lateral Step Up Right;Left;10 reps    Forward Step Up Both;Hand Hold: 1;Step Height: 6";5 reps;2 sets    SLS 3-point taps x 5 rounds light UE support on counter      Knee/Hip Exercises: Seated   Long Arc Quad Right;Left;1 set;10 reps   2lb weight Bil   Marching Strengthening;Both;1 set;10 reps;Weights    Marching Limitations hold 5lb on thigh    Sit to [employer] 10 reps;without UE support;2 sets      Shoulder Exercises: Standing   Extension Both;Strengthening;10 reps;Theraband    Theraband Level (Shoulder Extension) Level 1 (Yellow)    Extension Limitations narrow base of support to challenge balance    Row Strengthening;Both;Theraband;20 reps    Theraband Level (Shoulder Row) Level 1 (Yellow)    Row Limitations narrow base of support to challenge balance                     PT Education - 09/05/21 1514     Education Details Pt educaiton performed on purpose of exercise progressions and possibility of residual soreness.    Person(s) Educated Patient    Methods Explanation;Demonstration;Tactile cues;Verbal cues    Comprehension Verbalized understanding              PT Short Term Goals - 08/22/21 1801       PT SHORT TERM GOAL #1   Title Pt will be safe and ind  with HEP for functional strength training and balance.    Time 4    Period Weeks    Status New    Target Date 09/19/21      PT SHORT TERM GOAL #2   Title Pt will be able to  particiapte in 6' walk test covering at least 500' using SPC without LOB.    Baseline 3' walk test 54' with SPC at initial eval    Time 4    Period Weeks    Status New    Target Date 09/19/21      PT SHORT TERM GOAL #3   Title Pt will achieve at least 4-/5 strength score for UEs.    Baseline 3+ bil    Time 4    Status New    Target Date 09/19/21      PT SHORT TERM GOAL #4   Title Pt will improve 5x sit to stand to </= 14"    Baseline 16"    Time 4    Period Weeks    Status New    Target Date 09/19/21      PT SHORT TERM GOAL #5   Title Pt will be able to perform SLS at counter with light single UE support x 30" on Rt and Lt LE    Time 4    Period Weeks    Status New    Target Date 09/19/21               PT Long Term Goals - 08/22/21 1806       PT LONG TERM GOAL #1   Title Pt will improve 5x sit<>stand to less than or equal to 12 sec for improved functional strength and demo of reduced fall risk.    Baseline 16"    Time 8    Period Weeks    Status New    Target Date 10/17/21      PT LONG TERM GOAL #2   Title Pt will improve FGA to score of at least 20/30 to demo less fall risk.    Baseline 14/30    Time 8    Period Weeks    Status New    Target Date 10/17/21      PT LONG TERM GOAL #3   Title Pt will demo safe step up and step down strategy from curb using SPC to reduce fall risk in the community.    Time 8    Period Weeks    Status New    Target Date 10/17/21      PT LONG TERM GOAL #4   Title Pt will be able to demo forward and lateral hurdle step over and back and alternating march taps to high cones without LOB to demo improved dynamic balance and safety with stepping strategies and brief SLS tasks.    Time 8    Period Weeks    Status New    Target Date 10/17/21      PT  LONG TERM GOAL #5   Title Pt will be ind with complete HEP and verbalize understanding of importance to continue for maintained strength, balance and safety.    Time 8    Period Weeks    Status New    Target Date 10/17/21      Additional Long Term Goals   Additional Long Term Goals Yes      PT LONG TERM GOAL #6   Title Pt will achieve at least 4/5 strength in bil UEs and 4+/5 strength in bil LEs for improved functional tasks such as stairs, transfers, reaching, carrying, and squatting.    Time 8    Period Weeks    Status New    Target Date 10/17/21  Plan - 09/05/21 1408     Clinical Impression Statement Pt demonstrated good participation throughout entire session. She was able to increase resistance on new step, add weights to long arc quads, increase repetitions to forward step ups and sit<>stands, and add lateral step ups with appropriate challenge and good form in order to progress muscular endurance, strengthening, and balance. She was observed to have more difficulty with Lt LE step up/lateral step compared to Rt, indicating greater weakness. She did require consistent verbal cues to increase awareness of foot placement on steps in order to optimize safety. We discussed possibility of increased residual soreness due to increase in activity today; she was encouraged over progress during today's treatment session. She will continue to benefit from skilled PT intervention in order to progress functional strengthening and improve balance/decrease fear of falling.    PT Treatment/Interventions ADLs/Self Care Home Management;Moist Heat;Gait training;Stair training;Functional mobility training;Therapeutic activities;Therapeutic exercise;Balance training;Neuromuscular re-education;Patient/family education;Manual techniques;Passive range of motion;Joint Manipulations    PT Next Visit Plan NuStep, continue gait and balance challenges, SLS, sit to stand from progressively  lower surface    PT Home Exercise Plan Access Code: [place] and Agree with Plan of Care Patient             Patient will benefit from skilled therapeutic intervention in order to improve the following deficits and impairments:  Abnormal gait, Decreased range of motion, Pain, Impaired UE functional use, Difficulty walking, Decreased balance, Decreased activity tolerance, Decreased strength, Decreased coordination, Decreased endurance  Visit Diagnosis: Other abnormalities of gait and mobility  Muscle weakness (generalized)  Repeated falls     Problem List Patient Active Problem List   Diagnosis Date Noted   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   Pain of right hip joint 09/28/2020   Pain in joint of left elbow 04/25/2020   Encounter for orthopedic follow-up care 04/19/2020   Closed comminuted fracture of proximal ulna 04/04/2020   Closed fracture of base of fifth metacarpal bone of left hand 03/29/2020   Closed fracture of proximal end of left humerus 03/29/2020   Closed fracture of olecranon process of left ulna 03/29/2020   Cervical radiculitis 08/11/2019   Lumbar spondylosis 01/11/2019   Urinary tract infectious disease 07/03/2018   Fractured nasal bones 07/03/2018   Knee pain 37/85/8850   Metabolic acidosis, normal anion gap (NAG) 07/03/2018   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   Long-term current use of opiate analgesic 09/08/2017   Recurrent falls 04/16/2017   History of total hip replacement, 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   History of 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 joint 04/16/2017   At risk for adverse drug event 04/07/2017   Postoperative anemia due to acute blood loss 04/04/2017   Delirium 04/04/2017   Osteoporosis 04/02/2017   Closed fracture of neck of left femur ([place]) 03/31/2017   Contusion of scalp 03/31/2017   Fall in home 03/31/2017   Hip fracture ([place]) 03/31/2017   Fracture of femur ([place]) 03/31/2017   Hemiplegic migraine  02/26/2017   History of transient ischemic attack 02/19/2017   Left carotid artery stenosis 02/19/2017   Protein calorie malnutrition ([place]) 02/18/2017   Deficiency of macronutrients 02/18/2017   Fall 02/17/2017   Chest pain 09/26/2016   Diverticulitis of sigmoid colon 04/25/2016   Infection due to ESBL-producing Escherichia coli/Diverticular Abscess 03/15/2016   History of infection due to ESBL Escherichia coli 03/15/2016   Diverticulitis 03/12/2016   Lesion of pancreas 03/12/2016   History of chest pain  03/12/2016   Hypokalemia 03/12/2016   Angina, class IV (HCC) - chest tightness and pressure with dyspnea with minimal exertion 02/17/2016    Class: Question of   Transient ischemic attack 12/26/2015   Dyspnea on exertion 12/26/2015   Right hemiparesis ([place]) 12/17/2015   Ataxia 12/17/2015   Fracture of shoulder 02/15/2015   Chronic headache 10/11/2013   Chronic headache disorder 10/11/2013   Asthenia 08/12/2013   Dizziness and giddiness 08/12/2013   Abnormal gait 07/11/2013   Hyperlipidemia 05/07/2007   Migraine 05/07/2007   Premature beats 05/07/2007   Gastroesophageal reflux disease 05/07/2007   Diverticulosis of colon 05/07/2007   Degeneration of lumbar intervertebral disc 05/07/2007   Disorder of skeletal system 05/07/2007   Depressive disorder 03/24/2007   Hemorrhoids 03/24/2007   Allergic rhinitis 03/24/2007   Lumbar pain 03/24/2007   MIGRAINES, HX OF 03/24/2007    Heather Roberts, PT, DPT02/09/233:16 PM   [facility] @ [place], [place], [address] Phone: [phone]   Fax:  [phone]  Name: Dorothy White MRN: [id number] Date of Birth: [birth year]

## 2021-09-10 ENCOUNTER — Other Ambulatory Visit: Payer: Self-pay

## 2021-09-10 ENCOUNTER — Ambulatory Visit: Payer: Medicare Other | Admitting: Physical Therapy

## 2021-09-10 DIAGNOSIS — R296 Repeated falls: Secondary | ICD-10-CM | POA: Diagnosis not present

## 2021-09-10 DIAGNOSIS — R2689 Other abnormalities of gait and mobility: Secondary | ICD-10-CM

## 2021-09-10 DIAGNOSIS — M6281 Muscle weakness (generalized): Secondary | ICD-10-CM | POA: Diagnosis not present

## 2021-09-10 NOTE — Therapy (Signed)
[place] @ [facility] [place], [place], [address] Phone: [phone]   Fax:  [phone]  Physical Therapy Treatment  Patient Details  Name: Dorothy White MRN: [id number] Date of Birth: [birth year] Referring Provider (PT): Jonathon Jordan, MD   Encounter Date: 09/10/2021   PT End of Session - 09/10/21 1707     Visit Number 5    Date for PT Re-Evaluation 10/17/21    Authorization Type [employer] Medicare    Progress Note Due on Visit 10    PT Start Time 1625   late   PT Stop Time 1701    PT Time Calculation (min) 36 min    Activity Tolerance Patient tolerated treatment well             Past Medical History:  Diagnosis Date   Anemia    ?   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 25% mLAD, otherwise no sig disease 01/2016 cath.   NICM (nonischemic cardiomyopathy) ([place])    a. EF 40-45% by echo 07/3242 at time of complicated migraine/neuro sx, 55-65% at time of cath 01/2016   Stroke [facility]) 06/2013   "mini" stroke , ?possibly hemaplegic migraine per pt    Past Surgical History:  Procedure Laterality Date   ANTERIOR APPROACH HEMI HIP ARTHROPLASTY Left 04/01/2017   Procedure: LEFT DIRECT ANTERIOR TOTAL HIP REPLACEMENT;  Surgeon: Leandrew Koyanagi, MD;  Location: [facility];  Service: Orthopedics;  Laterality: Left;  LEFT DIRECT ANTERIOR TOTAL HIP REPLACEMENT   AUGMENTATION MAMMAPLASTY  1980   BASAL CELL CARCINOMA EXCISION     "tip of my nose"    BUNIONECTOMY Bilateral 10/2003   CARDIAC CATHETERIZATION N/A 02/20/2016   Procedure: Left Heart Cath and Coronary Angiography;  Surgeon: Jettie Booze, MD;  Location: [facility];  Service: Cardiovascular;  Laterality: N/A;   COLONOSCOPY     DILATION AND CURETTAGE OF UTERUS     FRACTURE SURGERY     IR GENERIC HISTORICAL  03/18/2016   IR SINUS/FIST TUBE CHK-NON GI 03/18/2016 Aletta Edouard, MD [facility]   LAPAROSCOPIC SIGMOID COLECTOMY N/A 04/25/2016   Procedure: LAPAROSCOPIC SIGMOID COLECTOMY;  Surgeon: Stark Klein, MD;  Location: [facility];  Service: General;  Laterality: N/A;   OOPHORECTOMY Bilateral ~ 1999   ORIF HUMERUS FRACTURE Right 02/15/2015   Procedure: OPEN REDUCTION INTERNAL FIXATION (ORIF) PROXIMAL HUMERUS FRACTURE;  Surgeon: Netta Cedars, MD;  Location: [facility];  Service: Orthopedics;  Laterality: Right;   ORIF HUMERUS FRACTURE Left 04/04/2020   ORIF HUMERUS FRACTURE Left 04/04/2020   Procedure: OPEN REDUCTION INTERNAL FIXATION (ORIF) PROXIMAL HUMERUS FRACTURE;  Surgeon: Iran Planas, MD;  Location: [facility];  Service: Orthopedics;  Laterality: Left;  regional block   ORIF ULNAR FRACTURE Left 04/04/2020   ORIF ULNAR FRACTURE Left 04/04/2020   Procedure: OPEN REDUCTION INTERNAL FIXATION (ORIF) ULNAR FRACTURE;  Surgeon: Iran Planas, MD;  Location: [facility];  Service: Orthopedics;  Laterality: Left;  with regional block   [place] Right 08/11/2020   Procedure: TOTAL HIP ARTHROPLASTY ANTERIOR APPROACH;  Surgeon: Rod Can, MD;  Location: [facility];  Service: Orthopedics;  Laterality: Right;   TUBAL LIGATION  ~ [place]  ~ 1998    There were no vitals filed for this visit.   Subjective Assessment - 09/10/21 1628     Subjective I was really sore after last visit.  I have a lot of stiffness in the mornings.  It takes me about an hour and a half to get going. Still feel unsteady turns or backwards.  One time  (a few weeks ago) the dog pulled me over playing with his football toy.  I sprained my wrist.   Pertinent History cervical and lumbar spondylosis    Currently in Pain? Yes    Pain Score 4     Pain Location Back                               [facility] Adult PT Treatment/Exercise - 09/10/21 0001       Neuro Re-ed    Neuro Re-ed Details  turns right and left without and with dual tasking (ball bounce and toss) reactionary strategies with manual perturbances at the shoulders 4 directions 3x each way;  backwards walk to hip green ball on counter and "bounce off" 10x; standing tug of war with towel with moderate challenges 6x      Lumbar Exercises: Aerobic   Nustep L3 x 5' PT present to monitor and discuss plan for session      Knee/Hip Exercises: Standing   Heel Raises Limitations heel and toe raises 10x    Walking with Sports Cord bungee resisted walking backward 6x and forward 6x    Other Standing Knee Exercises dyanamic balance warm up next to counter: high step, backwards walk, grapevine, side stepping 2 laps each    Other Standing Knee Exercises church pew sways 1 min                       PT Short Term Goals - 08/22/21 1801       PT SHORT TERM GOAL #1   Title Pt will be safe and ind with HEP for functional strength training and balance.    Time 4    Period Weeks    Status New    Target Date 09/19/21      PT SHORT TERM GOAL #2   Title Pt will be able to particiapte in 6' walk test covering at least 500' using SPC without LOB.    Baseline 3' walk test 105' with SPC at initial eval    Time 4    Period Weeks    Status New    Target Date 09/19/21      PT SHORT TERM GOAL #3   Title Pt will achieve at least 4-/5 strength score for UEs.    Baseline 3+ bil    Time 4    Status New    Target Date 09/19/21      PT SHORT TERM GOAL #4   Title Pt will improve 5x sit to stand to </= 14"    Baseline 16"    Time 4    Period  Weeks    Status New     Target Date 09/19/21      PT SHORT TERM GOAL #5   Title Pt will be able to perform SLS at counter with light single UE support x 30" on Rt and Lt LE    Time 4    Period Weeks    Status New    Target Date 09/19/21               PT Long Term Goals - 08/22/21 1806       PT LONG TERM GOAL #1   Title Pt will improve 5x sit<>stand to less than or equal to 12 sec for improved functional strength and demo of reduced fall risk.    Baseline 16"    Time 8    Period Weeks    Status New    Target Date 10/17/21      PT LONG TERM GOAL #2   Title Pt will improve FGA to score of at least 20/30 to demo less fall risk.    Baseline 14/30    Time 8    Period Weeks    Status New    Target Date 10/17/21      PT LONG TERM GOAL #3   Title Pt will demo safe step up and step down strategy from curb using SPC to reduce fall risk in the community.    Time 8    Period Weeks    Status New    Target Date 10/17/21      PT LONG TERM GOAL #4   Title Pt will be able to demo forward and lateral hurdle step over and back and alternating march taps to high cones without LOB to demo improved dynamic balance and safety with stepping strategies and brief SLS tasks.    Time 8    Period Weeks    Status New    Target Date 10/17/21      PT LONG TERM GOAL #5   Title Pt will be ind with complete HEP and verbalize understanding of importance to continue for maintained strength, balance and safety.    Time 8    Period Weeks    Status New    Target Date 10/17/21      Additional Long Term Goals   Additional Long Term Goals Yes      PT LONG TERM GOAL #6   Title Pt will achieve at least 4/5 strength in bil UEs and 4+/5 strength in bil LEs for improved functional tasks such as stairs, transfers, reaching, carrying, and squatting.    Time 8    Period Weeks    Status New    Target Date 10/17/21                   Plan - 09/10/21 1708     Clinical Impression Statement Treatment focus on dynamic  balance including turns with dual tasks.  CGA provided but no loss of balance or assistance needed.  Also focused on reactionary balance with perturbances in multiple directions.  Encouraged a reactionary step particularly in the backwards direction as a means for self recovery with loss of balance.  Gait belt used during the entire session for safety.    Personal Factors and Comorbidities Comorbidity 1;Comorbidity 2;Time since onset of injury/illness/exacerbation;Comorbidity 3+    Comorbidities Hx of multiple falls with fractures, osteoporosis, Hx of TIAs.    Examination-Activity Limitations Carry;Lift;Locomotion Level;Reach Overhead;Stairs;Squat;Bend    Rehab Potential Good  PT Frequency 2x / week    PT Duration 8 weeks    PT Treatment/Interventions ADLs/Self Care Home Management;Moist Heat;Gait training;Stair training;Functional mobility training;Therapeutic activities;Therapeutic exercise;Balance training;Neuromuscular re-education;Patient/family education;Manual techniques;Passive range of motion;Joint Manipulations    PT Next Visit Plan NuStep, continue gait and balance challenges, SLS, sit to stand from progressively lower surface; check STGs next week    PT Home Exercise Plan Access Code: [facility]             Patient will benefit from skilled therapeutic intervention in order to improve the following deficits and impairments:  Abnormal gait, Decreased range of motion, Pain, Impaired UE functional use, Difficulty walking, Decreased balance, Decreased activity tolerance, Decreased strength, Decreased coordination, Decreased endurance  Visit Diagnosis: Other abnormalities of gait and mobility  Muscle weakness (generalized)  Repeated falls     Problem List Patient Active Problem List   Diagnosis Date Noted   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   Pain of right hip joint 09/28/2020   Pain in joint of left elbow 04/25/2020   Encounter for orthopedic follow-up care  04/19/2020   Closed comminuted fracture of proximal ulna 04/04/2020   Closed fracture of base of fifth metacarpal bone of left hand 03/29/2020   Closed fracture of proximal end of left humerus 03/29/2020   Closed fracture of olecranon process of left ulna 03/29/2020   Cervical radiculitis 08/11/2019   Lumbar spondylosis 01/11/2019   Urinary tract infectious disease 07/03/2018   Fractured nasal bones 07/03/2018   Knee pain 56/97/9480   Metabolic acidosis, normal anion gap (NAG) 07/03/2018   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   Long-term current use of opiate analgesic 09/08/2017   Recurrent falls 04/16/2017   History of total hip replacement, 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   History of 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 joint 04/16/2017   At risk for adverse drug event 04/07/2017   Postoperative anemia due to acute blood loss 04/04/2017   Delirium 04/04/2017   Osteoporosis 04/02/2017   Closed fracture of neck of left femur ([place]) 03/31/2017   Contusion of scalp 03/31/2017   Fall in home 03/31/2017   Hip fracture ([place]) 03/31/2017   Fracture of femur ([place]) 03/31/2017   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   History of transient ischemic attack 02/19/2017   Left carotid artery stenosis 02/19/2017   Protein calorie malnutrition ([place]) 02/18/2017   Deficiency of macronutrients 02/18/2017   Fall 02/17/2017   Chest pain 09/26/2016   Diverticulitis of sigmoid colon 04/25/2016   Infection due to ESBL-producing Escherichia coli/Diverticular Abscess 03/15/2016   History of infection due to ESBL Escherichia coli 03/15/2016   Diverticulitis 03/12/2016   Lesion of pancreas 03/12/2016   History of chest pain 03/12/2016   Hypokalemia 03/12/2016   Angina, class IV (HCC) - chest tightness and pressure with dyspnea with minimal exertion 02/17/2016    Class: Question of   Transient ischemic attack 12/26/2015   Dyspnea on exertion 12/26/2015   Right hemiparesis ([place]) 12/17/2015   Ataxia 12/17/2015    Fracture of shoulder 02/15/2015   Chronic headache 10/11/2013   Chronic headache disorder 10/11/2013   Asthenia 08/12/2013   Dizziness and giddiness 08/12/2013   Abnormal gait 07/11/2013   Hyperlipidemia 05/07/2007   Migraine 05/07/2007   Premature beats 05/07/2007   Gastroesophageal reflux disease 05/07/2007   Diverticulosis of colon 05/07/2007   Degeneration of lumbar intervertebral disc 05/07/2007   Disorder of skeletal system 05/07/2007   Depressive disorder 03/24/2007   Hemorrhoids 03/24/2007   Allergic rhinitis 03/24/2007  Lumbar pain 03/24/2007   MIGRAINES, HX OF 03/24/2007   Ruben Im, PT 09/10/21 5:14 PM Phone: [phone] Fax: [phone]  Alvera Singh, PT 09/10/2021, 5:13 PM  [facility] @ [facility] [place], [place], [address] Phone: [phone]   Fax:  [phone]  Name: Dorothy White MRN: [id number] Date of Birth: 1951-03-19

## 2021-09-17 ENCOUNTER — Ambulatory Visit: Payer: Medicare Other | Admitting: Physical Therapy

## 2021-09-18 ENCOUNTER — Other Ambulatory Visit: Payer: Self-pay

## 2021-09-18 ENCOUNTER — Ambulatory Visit (HOSPITAL_COMMUNITY)
Admission: RE | Admit: 2021-09-18 | Discharge: 2021-09-18 | Disposition: A | Discharge status: Home / Self Care | Payer: Medicare Other | Source: Ambulatory Visit | Attending: Family Medicine | Admitting: Family Medicine

## 2021-09-18 DIAGNOSIS — I361 Nonrheumatic tricuspid (valve) insufficiency: Secondary | ICD-10-CM

## 2021-09-18 DIAGNOSIS — R799 Abnormal finding of blood chemistry, unspecified: Secondary | ICD-10-CM | POA: Insufficient documentation

## 2021-09-18 LAB — ECHOCARDIOGRAM COMPLETE
AR max vel: 2.31 cm2
AV Peak grad: 7.4 mmHg
Ao pk vel: 1.36 m/s
Area-P 1/2: 4.19 cm2
Calc EF: 56.3 %
S' Lateral: 2.8 cm
Single Plane A2C EF: 55.3 %
Single Plane A4C EF: 57.2 %

## 2021-09-19 ENCOUNTER — Ambulatory Visit: Payer: Medicare Other | Admitting: Physical Therapy

## 2021-09-19 DIAGNOSIS — R2689 Other abnormalities of gait and mobility: Secondary | ICD-10-CM

## 2021-09-19 DIAGNOSIS — R296 Repeated falls: Secondary | ICD-10-CM | POA: Diagnosis not present

## 2021-09-19 DIAGNOSIS — M6281 Muscle weakness (generalized): Secondary | ICD-10-CM

## 2021-09-19 NOTE — Therapy (Signed)
[facility] @ [place], [place], [address] Phone: [phone]   Fax:  [phone]  Physical Therapy Treatment  Patient Details  Name: Dorothy White MRN: [id number] Date of Birth: [birth year] Referring Provider (PT): Jonathon Jordan, MD   Encounter Date: 09/19/2021   PT End of Session - 09/19/21 1400     Visit Number 6    Date for PT Re-Evaluation 10/17/21    Authorization Type UHC Medicare    Progress Note Due on Visit 10    PT Start Time 1400    PT Stop Time 1440    PT Time Calculation (min) 40 min    Activity Tolerance Patient tolerated treatment well             Past Medical History:  Diagnosis Date   Anemia    ?   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 25% mLAD, otherwise no sig disease 01/2016 cath.   NICM (nonischemic cardiomyopathy) ([place])    a. EF 40-45% by echo 01/6733 at time of complicated migraine/neuro sx, 55-65% at time of cath 01/2016   Stroke [facility]) 06/2013   "mini" stroke , ?possibly hemaplegic migraine per pt    Past Surgical History:  Procedure Laterality Date   ANTERIOR APPROACH HEMI HIP ARTHROPLASTY Left 04/01/2017   Procedure: LEFT DIRECT ANTERIOR TOTAL HIP REPLACEMENT;  Surgeon: Leandrew Koyanagi, MD;  Location: [place] [facility];  Service: Orthopedics;  Laterality: Left;  LEFT DIRECT ANTERIOR TOTAL HIP REPLACEMENT   AUGMENTATION MAMMAPLASTY  1980   BASAL CELL CARCINOMA EXCISION     "tip of my nose"    BUNIONECTOMY Bilateral 10/2003   CARDIAC CATHETERIZATION N/A 02/20/2016   Procedure: Left Heart Cath and Coronary Angiography;  Surgeon: Jettie Booze, MD;  Location: [facility];  Service: Cardiovascular;  Laterality: N/A;   COLONOSCOPY     DILATION AND CURETTAGE OF UTERUS     FRACTURE SURGERY     IR GENERIC HISTORICAL  03/18/2016   IR SINUS/FIST TUBE CHK-NON GI 03/18/2016 Aletta Edouard, MD [facility] RAD   LAPAROSCOPIC SIGMOID COLECTOMY N/A 04/25/2016   Procedure: LAPAROSCOPIC SIGMOID COLECTOMY;  Surgeon: Stark Klein, MD;  Location: [facility];  Service: General;  Laterality: N/A;   OOPHORECTOMY Bilateral ~ 1999   ORIF HUMERUS FRACTURE Right 02/15/2015   Procedure: OPEN REDUCTION INTERNAL FIXATION (ORIF) PROXIMAL HUMERUS FRACTURE;  Surgeon: Netta Cedars, MD;  Location: [facility];  Service: Orthopedics;  Laterality: Right;   ORIF HUMERUS FRACTURE Left 04/04/2020   ORIF HUMERUS FRACTURE Left 04/04/2020   Procedure: OPEN REDUCTION INTERNAL FIXATION (ORIF) PROXIMAL HUMERUS FRACTURE;  Surgeon: Iran Planas, MD;  Location: [facility];  Service: Orthopedics;  Laterality: Left;  regional block   ORIF ULNAR FRACTURE Left 04/04/2020   ORIF ULNAR FRACTURE Left 04/04/2020   Procedure: OPEN REDUCTION INTERNAL FIXATION (ORIF) ULNAR FRACTURE;  Surgeon: Caralyn Guile,  Josph Macho, MD;  Location: [facility];  Service: Orthopedics;  Laterality: Left;  with regional block   [place] Right 08/11/2020   Procedure: TOTAL HIP ARTHROPLASTY ANTERIOR APPROACH;  Surgeon: Rod Can, MD;  [facility];  Service: Orthopedics;  Laterality: Right;   TUBAL LIGATION  ~ [place]  ~ 1998    There were no vitals filed for this visit.   Subjective Assessment - 09/19/21 1403     Subjective My knees and hands hurt in the mornings.  I wasn't sore after last time like I was before.    Pertinent History cervical and lumbar spondylosis    Patient Stated Goals reduce  stiffness, work on strength and balance    Currently in Pain? No/denies    Pain Score 0-No pain                [facility] PT Assessment - 09/19/21 0001       Standardized Balance Assessment   Five times sit to stand comments  11 sec no hands                           [facility] Adult PT Treatment/Exercise - 09/19/21 0001       Ambulation/Gait   Ambulation/Gait Yes    Ambulation/Gait Assistance 6: Modified independent (Device/Increase time)    Ambulation Distance (Feet) 567 Feet   in 5 min 30 sec     Neuro Re-ed    Neuro Re-ed Details  turns right and left without and with dual tasking (ball bounce and toss) reactionary strategies with manual perturbances at the shoulders 4 directions 3x each way;  backwards walk to hip  on counter and; balloon taps while walking;  standingt  dual task cognitive tasking added      Lumbar Exercises: Aerobic   Nustep L3 x 6' PT present to monitor and discuss plan for session      Lumbar Exercises: Standing   Other Standing Lumbar Exercises BOSU step taps 10x      Knee/Hip Exercises: Standing   Walking with Sports Cord --    Other Standing Knee Exercises dynamic balance warm up next to counter: high step, backwards walk, grapevine, side stepping 2 laps each    Other Standing Knee Exercises church pew sways 1 min                       PT Short Term Goals - 09/19/21 1436       PT SHORT TERM GOAL #1   Title Pt will be safe and ind with HEP for functional strength training and balance.    Status Achieved      PT SHORT TERM GOAL #2   Title Pt will be able to particiapte in 6' walk test covering at least 500' using SPC without LOB.    Time 4    Status Achieved      PT SHORT TERM GOAL #3   Title Pt will achieve at least 4-/5 strength score for UEs.    Status Achieved      PT SHORT TERM GOAL #4   Title Pt will improve 5x sit to stand to </= 14"    Status Achieved      PT SHORT TERM GOAL #5   Title Pt will be able  to perform SLS at counter with light single UE support  x 30" on Rt and Lt LE    Status Achieved               PT Long Term Goals - 08/22/21 1806       PT LONG TERM GOAL #1   Title Pt will improve 5x sit<>stand to less than or equal to 12 sec for improved functional strength and demo of reduced fall risk.    Baseline 16"    Time 8    Period Weeks    Status New    Target Date 10/17/21      PT LONG TERM GOAL #2   Title Pt will improve FGA to score of at least 20/30 to demo less fall risk.    Baseline 14/30    Time 8    Period Weeks    Status New    Target Date 10/17/21      PT LONG TERM GOAL #3   Title Pt will demo safe step up and step down strategy from curb using SPC to reduce fall risk in the community.    Time 8    Period Weeks    Status New    Target Date 10/17/21      PT LONG TERM GOAL #4   Title Pt will be able to demo forward and lateral hurdle step over and back and alternating march taps to high cones without LOB to demo improved dynamic balance and safety with stepping strategies and brief SLS tasks.    Time 8    Period Weeks    Status New    Target Date 10/17/21      PT LONG TERM GOAL #5   Title Pt will be ind with complete HEP and verbalize understanding of importance to continue for maintained strength, balance and safety.    Time 8    Period Weeks    Status New    Target Date 10/17/21      Additional Long Term Goals   Additional Long Term Goals Yes      PT LONG TERM GOAL #6   Title Pt will achieve at least 4/5 strength in bil UEs and 4+/5 strength in bil LEs for improved functional tasks such as stairs, transfers, reaching, carrying, and squatting.    Time 8    Period Weeks    Status New    Target Date 10/17/21                   Plan - 09/19/21 1432     Clinical Impression Statement The patient has improved gait speed without SPC meeting STG of 500 feet in < 6 min.  Her 5x sit to stand test also significantly improved to 11 sec  (initially 16 sec).  She is able to perform moderate dynamic balance and reactionary challenges without physical assist from therapist but uses short steps decreased speed compensatory strategies.  Therapist providing close supervision and gait belt on for safety.    Personal Factors and Comorbidities Comorbidity 1;Comorbidity 2;Time since onset of injury/illness/exacerbation;Comorbidity 3+    Comorbidities Hx of multiple falls with fractures, osteoporosis, Hx of TIAs.    Examination-Activity Limitations Carry;Lift;Locomotion Level;Reach Overhead;Stairs;Squat;Bend    Examination-Participation Restrictions Community Activity;Shop;Cleaning    Rehab Potential Good    PT Frequency 2x / week    PT Duration 8 weeks    PT Treatment/Interventions ADLs/Self Care Home Management;Moist Heat;Gait training;Stair training;Functional mobility training;Therapeutic activities;Therapeutic exercise;Balance training;Neuromuscular re-education;Patient/family education;Manual techniques;Passive range of motion;Joint Manipulations    PT  Next Visit Plan NuStep, continue gait and balance challenges, SLS, sit to stand from progressively lower surface; reactionary balance; resisted walking    PT Home Exercise Plan Access Code: [facility]             Patient will benefit from skilled therapeutic intervention in order to improve the following deficits and impairments:  Abnormal gait, Decreased range of motion, Pain, Impaired UE functional use, Difficulty walking, Decreased balance, Decreased activity tolerance, Decreased strength, Decreased coordination, Decreased endurance  Visit Diagnosis: Other abnormalities of gait and mobility  Muscle weakness (generalized)  Repeated falls     Problem List Patient Active Problem List   Diagnosis Date Noted   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   Pain of right hip joint 09/28/2020   Pain in joint of left elbow 04/25/2020   Encounter for orthopedic follow-up care  04/19/2020   Closed comminuted fracture of proximal ulna 04/04/2020   Closed fracture of base of fifth metacarpal bone of left hand 03/29/2020   Closed fracture of proximal end of left humerus 03/29/2020   Closed fracture of olecranon process of left ulna 03/29/2020   Cervical radiculitis 08/11/2019   Lumbar spondylosis 01/11/2019   Urinary tract infectious disease 07/03/2018   Fractured nasal bones 07/03/2018   Knee pain 93/81/0175   Metabolic acidosis, normal anion gap (NAG) 07/03/2018   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   Long-term current use of opiate analgesic 09/08/2017   Recurrent falls 04/16/2017   History of total hip replacement, 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   History of 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 joint 04/16/2017   At risk for adverse drug event 04/07/2017   Postoperative anemia due to acute blood loss 04/04/2017   Delirium 04/04/2017   Osteoporosis 04/02/2017   Closed fracture of neck of left femur ([place]) 03/31/2017   Contusion of scalp 03/31/2017   Fall in home 03/31/2017   Hip fracture ([place]) 03/31/2017   Fracture of femur ([place]) 03/31/2017   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   History of transient ischemic attack 02/19/2017   Left carotid artery stenosis 02/19/2017   Protein calorie malnutrition ([place]) 02/18/2017   Deficiency of macronutrients 02/18/2017   Fall 02/17/2017   Chest pain 09/26/2016   Diverticulitis of sigmoid colon 04/25/2016   Infection due to ESBL-producing Escherichia coli/Diverticular Abscess 03/15/2016   History of infection due to ESBL Escherichia coli 03/15/2016   Diverticulitis 03/12/2016   Lesion of pancreas 03/12/2016   History of chest pain 03/12/2016   Hypokalemia 03/12/2016   Angina, class IV (HCC) - chest tightness and pressure with dyspnea with minimal exertion 02/17/2016    Class: Question of   Transient ischemic attack 12/26/2015   Dyspnea on exertion 12/26/2015   Right hemiparesis ([place]) 12/17/2015   Ataxia 12/17/2015    Fracture of shoulder 02/15/2015   Chronic headache 10/11/2013   Chronic headache disorder 10/11/2013   Asthenia 08/12/2013   Dizziness and giddiness 08/12/2013   Abnormal gait 07/11/2013   Hyperlipidemia 05/07/2007   Migraine 05/07/2007   Premature beats 05/07/2007   Gastroesophageal reflux disease 05/07/2007   Diverticulosis of colon 05/07/2007   Degeneration of lumbar intervertebral disc 05/07/2007   Disorder of skeletal system 05/07/2007   Depressive disorder 03/24/2007   Hemorrhoids 03/24/2007   Allergic rhinitis 03/24/2007   Lumbar pain 03/24/2007   MIGRAINES, HX OF 03/24/2007   Ruben Im, PT 09/19/21 2:55 PM Phone: [phone] Fax: [phone]  Alvera Singh, PT 09/19/2021, 2:55 PM  [facility] @  [place], [place], [address] Phone: [phone]   Fax:  [phone]  Name: Dorothy White MRN: [id number] Date of Birth: 02/09/1951

## 2021-09-24 ENCOUNTER — Encounter: Payer: Self-pay | Admitting: Physical Therapy

## 2021-09-24 ENCOUNTER — Ambulatory Visit: Payer: Medicare Other | Admitting: Physical Therapy

## 2021-09-24 ENCOUNTER — Other Ambulatory Visit: Payer: Self-pay

## 2021-09-24 DIAGNOSIS — R2689 Other abnormalities of gait and mobility: Secondary | ICD-10-CM

## 2021-09-24 DIAGNOSIS — R296 Repeated falls: Secondary | ICD-10-CM

## 2021-09-24 DIAGNOSIS — M6281 Muscle weakness (generalized): Secondary | ICD-10-CM | POA: Diagnosis not present

## 2021-09-24 NOTE — Therapy (Signed)
[facility] @ [place], [place], [address] Phone: [phone]   Fax:  [phone]  Physical Therapy Treatment  Patient Details  Name: Dorothy White MRN: [id number] Date of Birth: 10-06-1950 Referring Provider (PT): Jonathon Jordan, MD   Encounter Date: 09/24/2021   PT End of Session - 09/24/21 1324     Visit Number 7    Date for PT Re-Evaluation 10/17/21    Authorization Type UHC Medicare    Progress Note Due on Visit 10    PT Start Time 1237    PT Stop Time 1313    PT Time Calculation (min) 36 min    Equipment Utilized During Treatment Gait belt    Activity Tolerance Patient tolerated treatment well    Behavior During Therapy [facility] for tasks assessed/performed             Past Medical History:  Diagnosis Date   Anemia    ?   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 25% mLAD, otherwise no sig disease 01/2016 cath.   NICM (nonischemic cardiomyopathy) ([place])    a. EF 40-45% by echo 0/8676 at time of complicated migraine/neuro sx, 55-65% at time of cath 01/2016   Stroke [facility]) 06/2013   "mini" stroke , ?possibly hemaplegic migraine per pt    Past Surgical History:  Procedure Laterality Date   ANTERIOR APPROACH HEMI HIP ARTHROPLASTY Left 04/01/2017   Procedure: LEFT DIRECT ANTERIOR TOTAL HIP REPLACEMENT;  Surgeon: Leandrew Koyanagi, MD;  Location: [place];  Service: Orthopedics;  Laterality: Left;  LEFT DIRECT ANTERIOR TOTAL HIP  REPLACEMENT   AUGMENTATION MAMMAPLASTY  1980   BASAL CELL CARCINOMA EXCISION     "tip of my nose"   BUNIONECTOMY Bilateral 10/2003   CARDIAC CATHETERIZATION N/A 02/20/2016   Procedure: Left Heart Cath and Coronary Angiography;  Surgeon: Jettie Booze, MD;  Location: [facility];  Service: Cardiovascular;  Laterality: N/A;   COLONOSCOPY     DILATION AND CURETTAGE OF UTERUS     FRACTURE SURGERY     IR GENERIC HISTORICAL  03/18/2016   IR SINUS/FIST TUBE CHK-NON GI 03/18/2016 Aletta Edouard, MD [facility]   LAPAROSCOPIC SIGMOID COLECTOMY N/A 04/25/2016   Procedure: LAPAROSCOPIC SIGMOID COLECTOMY;  Surgeon: Stark Klein, MD;  Location: [facility];  Service: General;  Laterality: N/A;   OOPHORECTOMY Bilateral ~ 1999   ORIF HUMERUS FRACTURE Right 02/15/2015   Procedure: OPEN REDUCTION INTERNAL FIXATION (ORIF) PROXIMAL HUMERUS FRACTURE;  Surgeon: Netta Cedars, MD;  Location: [facility];  Service: Orthopedics;  Laterality: Right;   ORIF HUMERUS FRACTURE Left 04/04/2020   ORIF HUMERUS FRACTURE Left 04/04/2020   Procedure: OPEN REDUCTION INTERNAL FIXATION (ORIF) PROXIMAL HUMERUS FRACTURE;  Surgeon: Iran Planas, MD;  Location: [facility];  Service: Orthopedics;  Laterality: Left;  regional block   ORIF ULNAR FRACTURE Left 04/04/2020  ORIF ULNAR FRACTURE Left 04/04/2020   Procedure: OPEN REDUCTION INTERNAL FIXATION (ORIF) ULNAR FRACTURE;  Surgeon: Iran Planas, MD;  Location: [facility];  Service: Orthopedics;  Laterality: Left;  with regional block   [place] Right 08/11/2020   Procedure: TOTAL HIP ARTHROPLASTY ANTERIOR APPROACH;  Surgeon: Rod Can, MD;  [facility];  Service: Orthopedics;  Laterality: Right;   TUBAL LIGATION  ~ [place]  ~ 1998    There were no vitals filed for this visit.                      [facility] Adult PT Treatment/Exercise - 09/24/21 0001       Ambulation/Gait   Ambulation/Gait  Yes    Ambulation/Gait Assistance 5: Supervision    Assistive device Straight cane    Ambulation Surface Unlevel;Level;Outdoor;Paved;Grass    Curb 5: Supervision;Other (comment)   sidesteps off curb from grass to pavement   Gait Comments outdoor ambulation up/down hill (paved) with head turns and cognitive challenge (question answers for categories)      Neuro Re-ed    Neuro Re-ed Details  balloon taps while walking, batting balloon off wall x 1', toss balloon overhead and turn to catch x 6 reps (to limit dizziness), ball toss and bounce with PT      Knee/Hip Exercises: Standing   SLS tap tall cone alt Rt/Lt x 20, then tap 2 cones Lt x 5 reps, Rt x 5 reps    Other Standing Knee Exercises dynamic balance warm up next to counter: high step, backwards walk, grapevine, side stepping 3 laps each                 Balance Exercises - 09/24/21 0001       Balance Exercises: Standing   Step Over Hurdles / Cones lateral and forward step over hurdles x 10 reps Rt and Lt                  PT Short Term Goals - 09/19/21 1436       PT SHORT TERM GOAL #1   Title Pt will be safe and ind with HEP for functional strength training and balance.    Status Achieved      PT SHORT TERM GOAL #2   Title Pt will be able to particiapte in 6' walk test covering at least 500' using SPC without LOB.    Time 4    Status Achieved      PT SHORT TERM GOAL #3   Title Pt will achieve at least 4-/5 strength score for UEs.    Status Achieved      PT SHORT TERM GOAL #4   Title Pt will improve 5x sit to stand to </= 14"    Status Achieved      PT SHORT TERM GOAL #5   Title Pt will be able to perform SLS at counter with light single UE support x 30" on Rt and Lt LE    Status Achieved               PT Long Term Goals - 09/24/21 1323       PT LONG TERM GOAL #1   Title Pt will improve 5x sit<>stand to less than or equal to 12 sec for improved functional strength and demo of reduced fall  risk.    Baseline 11 sec  Status Achieved      PT LONG TERM GOAL #3   Title Pt will demo safe step up and step down strategy from curb using SPC to reduce fall risk in the community.    Status Achieved      PT LONG TERM GOAL #4   Title Pt will be able to demo forward and lateral hurdle step over and back and alternating march taps to high cones without LOB to demo improved dynamic balance and safety with stepping strategies and brief SLS tasks.    Status Achieved                   Plan - 09/24/21 1318     Clinical Impression Statement Pt able to demo safety with supervision only with moderate dynamic balance tasks today.  She demo'd multi-directional stepping strategies with ball toss outside of center with safety today.  PT added forward and lateral step over hurdle, standing tall cone taps (single and double cone taps), and outdoor amublation on grass and incline/decline x 6' today with good tolerance and without LOB.  She met several LTGs today achieving these tasks.  Pt is demonstrating signif improvement in balance, endurance and safety.  Continue along POC.    Comorbidities Hx of multiple falls with fractures, osteoporosis, Hx of TIAs.    PT Frequency 2x / week    PT Duration 8 weeks    PT Treatment/Interventions ADLs/Self Care Home Management;Moist Heat;Gait training;Stair training;Functional mobility training;Therapeutic activities;Therapeutic exercise;Balance training;Neuromuscular re-education;Patient/family education;Manual techniques;Passive range of motion;Joint Manipulations    PT Next Visit Plan NuStep, continue gait and balance challenges, SLS, sit to stand from progressively lower surface; reactionary balance; resisted walking    PT Home Exercise Plan Access Code: [place] and Agree with Plan of Care Patient             Patient will benefit from skilled therapeutic intervention in order to improve the following deficits and impairments:      Visit Diagnosis: Other abnormalities of gait and mobility  Muscle weakness (generalized)  Repeated falls     Problem List Patient Active Problem List   Diagnosis Date Noted   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   Pain of right hip joint 09/28/2020   Pain in joint of left elbow 04/25/2020   Encounter for orthopedic follow-up care 04/19/2020   Closed comminuted fracture of proximal ulna 04/04/2020   Closed fracture of base of fifth metacarpal bone of left hand 03/29/2020   Closed fracture of proximal end of left humerus 03/29/2020   Closed fracture of olecranon process of left ulna 03/29/2020   Cervical radiculitis 08/11/2019   Lumbar spondylosis 01/11/2019   Urinary tract infectious disease 07/03/2018   Fractured nasal bones 07/03/2018   Knee pain 29/79/8921   Metabolic acidosis, normal anion gap (NAG) 07/03/2018   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   Long-term current use of opiate analgesic 09/08/2017   Recurrent falls 04/16/2017   History of total hip replacement, 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   History of 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 joint 04/16/2017   At risk for adverse drug event 04/07/2017   Postoperative anemia due to acute blood loss 04/04/2017   Delirium 04/04/2017   Osteoporosis 04/02/2017   Closed fracture of neck of left femur ([place]) 03/31/2017   Contusion of scalp 03/31/2017   Fall in home 03/31/2017   Hip fracture ([place]) 03/31/2017   Fracture of femur ([place]) 03/31/2017   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  History of transient ischemic attack 02/19/2017   Left carotid artery stenosis 02/19/2017   Protein calorie malnutrition ([place]) 02/18/2017   Deficiency of macronutrients 02/18/2017   Fall 02/17/2017   Chest pain 09/26/2016   Diverticulitis of sigmoid colon 04/25/2016   Infection due to ESBL-producing Escherichia coli/Diverticular Abscess 03/15/2016   History of infection due to ESBL Escherichia coli 03/15/2016   Diverticulitis 03/12/2016    Lesion of pancreas 03/12/2016   History of chest pain 03/12/2016   Hypokalemia 03/12/2016   Angina, class IV (HCC) - chest tightness and pressure with dyspnea with minimal exertion 02/17/2016    Class: Question of   Transient ischemic attack 12/26/2015   Dyspnea on exertion 12/26/2015   Right hemiparesis ([place]) 12/17/2015   Ataxia 12/17/2015   Fracture of shoulder 02/15/2015   Chronic headache 10/11/2013   Chronic headache disorder 10/11/2013   Asthenia 08/12/2013   Dizziness and giddiness 08/12/2013   Abnormal gait 07/11/2013   Hyperlipidemia 05/07/2007   Migraine 05/07/2007   Premature beats 05/07/2007   Gastroesophageal reflux disease 05/07/2007   Diverticulosis of colon 05/07/2007   Degeneration of lumbar intervertebral disc 05/07/2007   Disorder of skeletal system 05/07/2007   Depressive disorder 03/24/2007   Hemorrhoids 03/24/2007   Allergic rhinitis 03/24/2007   Lumbar pain 03/24/2007   MIGRAINES, HX OF 03/24/2007    Baruch Merl, PT 09/24/21 1:26 PM   [facility] @ [place], [place], [address] Phone: [phone]   Fax:  [phone]  Name: Dorothy White MRN: [id number] Date of Birth: February 19, 1951

## 2021-09-24 NOTE — Progress Notes (Deleted)
? ? ?Assessment/Plan:  ? ?1.  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 -Patient with multiple syncopal episodes, hypotension, multiple falls.  As below and with previous visits, it is really very difficult to assess what is causing falls because of the presence of the significant amount of opioid medication that she is on.  My suspicion is that this is contributing significantly to all of the above.  She has had very serious consequences of falls including fractures and subdural hematoma.  Have discussed with her multiple times that I no longer want her walking.  Unfortunately, she has not followed those recommendations and has continued to have more fracture since then (T2 spinous process, ribs).   I would like her in a wheelchair at all times.   discussed power WC.  She absolutely declines.  I told her that I would not expect that she will likely have a hip fracture and another brain bleed if she does not stay in a wheelchair.   ? -Because of the confounding picture with the opioids, I would like her to do a DaTscan.  She and I discussed this in detail. ? ?2.  Chronic back pain ? -Would really like to see the patient off of opioid medications, primarily because these also increase her risk for falls and confusion.  She gets between 140 oxycodone per month ? ? ? ? ?Subjective:  ? ?Dorothy White was seen today in follow up for Parkinsons disease.  My previous records were reviewed prior to todays visit as well as outside records available to me.  I have not seen the patient in quite some time.  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  Last visit, and many visits before that, I have discussed my concerns about her benzodiazepines and opioid medications contributing to falls.  Discussed with her that it is virtually impossible to assess her falls and etiology for the falls while she is on this much narcotic medication.  Since our last visit, she has had to  transfer her primary care, and the new primary care physician would not refill her clonazepam, so she is off of that.  She continues to receive #140 oxycodone per month.  She did call me and asked me for referral to physical therapy.  I declined that because I wanted her to stay seated at all times, and because she had canceled so many visits here.  She ended up getting that referral from primary care.  As of last visit, she has been off of the topiramate because of weight loss.  She had been on it for over 20 years.  She stated that she had been having more headaches, so I was not convinced it was helping anyway. ? ? ?PREVIOUS MEDICATIONS: levodopa (nausea, ?diarrhea but has chronic diarrhea) ? ? ? ?PREVIOUS MEDICATIONS: Sinemet ? ?ALLERGIES:   ?Allergies  ?Allergen Reactions  ? Opana [Oxymorphone Hcl] Other (See Comments)  ?  hallucinations  ? Penicillins Hives  ?  Has patient had a PCN reaction causing immediate rash, facial/tongue/throat swelling, SOB or lightheadedness with hypotension: Unknown ?Has patient had a PCN reaction causing severe rash involving mucus membranes or skin necrosis: No ?Has patient had a PCN reaction that required hospitalization No ?Has patient had a PCN reaction occurring within the last 10 years: No ?If all of the above answers are "NO", then may proceed with Cephalosporin use. ?  ? ? ?CURRENT MEDICATIONS:  ?Outpatient Encounter Medications as  of 09/26/2021  ?Medication Sig  ? mirtazapine (REMERON) 15 MG tablet TAKE 1 TABLET BY MOUTH EVERYDAY AT BEDTIME  ? Bacitracin-Polymyxin B ([employer] POLY BACITRACIN) 500-10000 UNIT/GM OINT APPLY TO AFFECTED AREA TWICE A DAY  ? bacitracin-polymyxin b (POLYSPORIN) ointment Poly Bacitracin (zinc) 500 unit-10,000 unit/gram topical ointment ? APPLY TO AFFECTED AREA TWICE A DAY  ? clonazePAM (KLONOPIN) 1 MG tablet Take 1 mg by mouth at bedtime as needed for anxiety.  ? docusate sodium (COLACE) 100 MG capsule Take 1 capsule (100 mg total) by mouth 2 (two) times  daily. (Patient taking differently: Take 100 mg by mouth daily as needed for mild constipation.)  ? FLUoxetine (PROZAC) 40 MG capsule Take 80 mg by mouth daily.  ? gabapentin (NEURONTIN) 100 MG capsule Take 200 mg by mouth 2 (two) times daily.  ? methylPREDNISolone (MEDROL DOSEPAK) 4 MG TBPK tablet See admin instructions.  ? omeprazole (PRILOSEC) 20 MG capsule Take 20 mg by mouth daily.  ? oxyCODONE-acetaminophen (PERCOCET) 10-325 MG tablet Take 1 tablet by mouth 5 (five) times daily as needed for pain.  ? SUMAtriptan (IMITREX) 100 MG tablet Take 50-100 mg by mouth 2 (two) times daily as needed for migraine.  ? ?No facility-administered encounter medications on file as of 09/26/2021.  ? ? ?Objective:  ? ?PHYSICAL EXAMINATION:   ? ?VITALS:   ?There were no vitals filed for this visit. ? ?Wt Readings from Last 3 Encounters:  ?05/31/21 94 lb 12.8 oz (43 kg)  ?03/21/21 94 lb 3.2 oz (42.7 kg)  ?03/15/21 90 lb (40.8 kg)  ? ? ?GEN:  The patient appears stated age and is in NAD. ?HEENT:  Normocephalic, atraumatic.  The mucous membranes are moist. The superficial temporal arteries are without ropiness or tenderness. ?CV:  RRR ?Lungs:  CTAB ?Neck/HEME:  There are no carotid bruits bilaterally. ? ?Neurological examination: ? ?Orientation: The patient is alert and oriented x3. ?Cranial nerves: There is good facial symmetry with min facial hypomimia. The speech is fluent and clear. Soft palate rises symmetrically and there is no tongue deviation. Hearing is intact to conversational tone. ?Sensation: Sensation is intact to light touch throughout ?Motor: Strength is at least antigravity x4. ? ?Movement examination: ?Tone: There is normal tone in the RUE/RLE.  Unable to test in the LUE due to wearing sling.  Tone increased in the LLE, mild ?Abnormal movements: rare tremor at rest in the L thumb that doesn't increase with distraction ?Coordination:  There is slowness on the left, especially with toe taps. ?Gait and Station: The  patient has no difficulty arising out of a deep-seated chair without the use of the hands. The patient's stride length is wide based, short stepped, ataxic.  ? ?I have reviewed and interpreted the following labs independently ? ?  Chemistry   ?   ?Component Value Date/Time  ? NA 137 05/31/2021 1502  ? NA 147 04/13/2017 0000  ? K 4.4 05/31/2021 1502  ? CL 104 05/31/2021 1502  ? CO2 27 05/31/2021 1502  ? BUN 11 05/31/2021 1502  ? BUN 11 04/13/2017 0000  ? CREATININE 0.59 05/31/2021 1502  ? CREATININE 0.95 02/15/2016 1536  ? GLU 93 04/13/2017 0000  ?    ?Component Value Date/Time  ? CALCIUM 8.8 (L) 05/31/2021 1502  ? CALCIUM 9.4 01/13/2007 2356  ? ALKPHOS 103 01/12/2021 1402  ? AST 12 (L) 01/12/2021 1402  ? ALT 8 01/12/2021 1402  ? BILITOT 1.1 01/12/2021 1402  ?  ? ? ? ?Lab Results  ?  Component Value Date  ? WBC 7.2 05/31/2021  ? HGB 11.0 (L) 05/31/2021  ? HCT 37.8 05/31/2021  ? MCV 77.0 (L) 05/31/2021  ? PLT 306 05/31/2021  ? ? ?Lab Results  ?Component Value Date  ? TSH 0.720 12/17/2017  ? ? ? ?Total time spent on today's visit was 38minutes, including both face-to-face time and nonface-to-face time.  Time included that spent on review of records (prior notes available to me/labs/imaging if pertinent), discussing treatment and goals, answering patient's questions and coordinating care. ? ?Cc:  Jonathon Jordan, MD ? ?

## 2021-09-26 ENCOUNTER — Ambulatory Visit: Payer: Medicare Other | Admitting: Neurology

## 2021-09-26 ENCOUNTER — Encounter: Payer: Self-pay | Admitting: Physical Therapy

## 2021-09-26 NOTE — Progress Notes (Deleted)
? ? ?Assessment/Plan:  ? ?1.  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 -Patient with multiple syncopal episodes, hypotension, multiple falls.  As below and with previous visits, it is really very difficult to assess what is causing falls because of the presence of the significant amount of opioid medication that she is on.  My suspicion is that this is contributing significantly to all of the above.  She has had very serious consequences of falls including fractures and subdural hematoma.  Have discussed with her multiple times that I no longer want her walking.  Unfortunately, she has not followed those recommendations and has continued to have more fracture since then (T2 spinous process, ribs).   I would like her in a wheelchair at all times.   discussed power WC.  She absolutely declines.  I told her that I would not expect that she will likely have a hip fracture and another brain bleed if she does not stay in a wheelchair.   ? -Because of the confounding picture with the opioids, I would like her to do a DaTscan.  She and I discussed this in detail. ? ?2.  Chronic back pain ? -Would really like to see the patient off of opioid medications, primarily because these also increase her risk for falls and confusion.  She gets between 140 oxycodone per month ? ? ? ? ?Subjective:  ? ?Dorothy White was seen today in follow up for Parkinsons disease.  My previous records were reviewed prior to todays visit as well as outside records available to me.  I have not seen the patient in quite some time.  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  Last visit, and many visits before that, I have discussed my concerns about her benzodiazepines and opioid medications contributing to falls.  Discussed with her that it is virtually impossible to assess her falls and etiology for the falls while she is on this much narcotic medication.  Since our last visit, she has had to  transfer her primary care, and the new primary care physician would not refill her clonazepam, so she is off of that.  She continues to receive #140 oxycodone per month.  She did call me and asked me for referral to physical therapy.  I declined that because I wanted her to stay seated at all times, and because she had canceled so many visits here.  She ended up getting that referral from primary care.  As of last visit, she has been off of the topiramate because of weight loss.  She had been on it for over 20 years.  She stated that she had been having more headaches, so I was not convinced it was helping anyway. ? ? ?PREVIOUS MEDICATIONS: levodopa (nausea, ?diarrhea but has chronic diarrhea) ? ? ? ?PREVIOUS MEDICATIONS: Sinemet ? ?ALLERGIES:   ?Allergies  ?Allergen Reactions  ? Opana [Oxymorphone Hcl] Other (See Comments)  ?  hallucinations  ? Penicillins Hives  ?  Has patient had a PCN reaction causing immediate rash, facial/tongue/throat swelling, SOB or lightheadedness with hypotension: Unknown ?Has patient had a PCN reaction causing severe rash involving mucus membranes or skin necrosis: No ?Has patient had a PCN reaction that required hospitalization No ?Has patient had a PCN reaction occurring within the last 10 years: No ?If all of the above answers are "NO", then may proceed with Cephalosporin use. ?  ? ? ?CURRENT MEDICATIONS:  ?Outpatient Encounter Medications as  of 10/08/2021  ?Medication Sig  ? mirtazapine (REMERON) 15 MG tablet TAKE 1 TABLET BY MOUTH EVERYDAY AT BEDTIME  ? Bacitracin-Polymyxin B ([employer] POLY BACITRACIN) 500-10000 UNIT/GM OINT APPLY TO AFFECTED AREA TWICE A DAY  ? bacitracin-polymyxin b (POLYSPORIN) ointment Poly Bacitracin (zinc) 500 unit-10,000 unit/gram topical ointment ? APPLY TO AFFECTED AREA TWICE A DAY  ? clonazePAM (KLONOPIN) 1 MG tablet Take 1 mg by mouth at bedtime as needed for anxiety.  ? docusate sodium (COLACE) 100 MG capsule Take 1 capsule (100 mg total) by mouth 2 (two)  times daily. (Patient taking differently: Take 100 mg by mouth daily as needed for mild constipation.)  ? FLUoxetine (PROZAC) 40 MG capsule Take 80 mg by mouth daily.  ? gabapentin (NEURONTIN) 100 MG capsule Take 200 mg by mouth 2 (two) times daily.  ? methylPREDNISolone (MEDROL DOSEPAK) 4 MG TBPK tablet See admin instructions.  ? omeprazole (PRILOSEC) 20 MG capsule Take 20 mg by mouth daily.  ? oxyCODONE-acetaminophen (PERCOCET) 10-325 MG tablet Take 1 tablet by mouth 5 (five) times daily as needed for pain.  ? SUMAtriptan (IMITREX) 100 MG tablet Take 50-100 mg by mouth 2 (two) times daily as needed for migraine.  ? ?No facility-administered encounter medications on file as of 10/08/2021.  ? ? ?Objective:  ? ?PHYSICAL EXAMINATION:   ? ?VITALS:   ?There were no vitals filed for this visit. ? ?Wt Readings from Last 3 Encounters:  ?05/31/21 94 lb 12.8 oz (43 kg)  ?03/21/21 94 lb 3.2 oz (42.7 kg)  ?03/15/21 90 lb (40.8 kg)  ? ? ?GEN:  The patient appears stated age and is in NAD. ?HEENT:  Normocephalic, atraumatic.  The mucous membranes are moist. The superficial temporal arteries are without ropiness or tenderness. ?CV:  RRR ?Lungs:  CTAB ?Neck/HEME:  There are no carotid bruits bilaterally. ? ?Neurological examination: ? ?Orientation: The patient is alert and oriented x3. ?Cranial nerves: There is good facial symmetry with min facial hypomimia. The speech is fluent and clear. Soft palate rises symmetrically and there is no tongue deviation. Hearing is intact to conversational tone. ?Sensation: Sensation is intact to light touch throughout ?Motor: Strength is at least antigravity x4. ? ?Movement examination: ?Tone: There is normal tone in the RUE/RLE.  Unable to test in the LUE due to wearing sling.  Tone increased in the LLE, mild ?Abnormal movements: rare tremor at rest in the L thumb that doesn't increase with distraction ?Coordination:  There is slowness on the left, especially with toe taps. ?Gait and Station: The  patient has no difficulty arising out of a deep-seated chair without the use of the hands. The patient's stride length is wide based, short stepped, ataxic.  ? ?I have reviewed and interpreted the following labs independently ? ?  Chemistry   ?   ?Component Value Date/Time  ? NA 137 05/31/2021 1502  ? NA 147 04/13/2017 0000  ? K 4.4 05/31/2021 1502  ? CL 104 05/31/2021 1502  ? CO2 27 05/31/2021 1502  ? BUN 11 05/31/2021 1502  ? BUN 11 04/13/2017 0000  ? CREATININE 0.59 05/31/2021 1502  ? CREATININE 0.95 02/15/2016 1536  ? GLU 93 04/13/2017 0000  ?    ?Component Value Date/Time  ? CALCIUM 8.8 (L) 05/31/2021 1502  ? CALCIUM 9.4 01/13/2007 2356  ? ALKPHOS 103 01/12/2021 1402  ? AST 12 (L) 01/12/2021 1402  ? ALT 8 01/12/2021 1402  ? BILITOT 1.1 01/12/2021 1402  ?  ? ? ? ?Lab Results  ?  Component Value Date  ? WBC 7.2 05/31/2021  ? HGB 11.0 (L) 05/31/2021  ? HCT 37.8 05/31/2021  ? MCV 77.0 (L) 05/31/2021  ? PLT 306 05/31/2021  ? ? ?Lab Results  ?Component Value Date  ? TSH 0.720 12/17/2017  ? ? ? ?Total time spent on today's visit was 37minutes, including both face-to-face time and nonface-to-face time.  Time included that spent on review of records (prior notes available to me/labs/imaging if pertinent), discussing treatment and goals, answering patient's questions and coordinating care. ? ?Cc:  Jonathon Jordan, MD ? ?

## 2021-10-01 ENCOUNTER — Ambulatory Visit: Payer: Medicare Other | Attending: Family Medicine | Admitting: Physical Therapy

## 2021-10-01 ENCOUNTER — Encounter: Payer: Self-pay | Admitting: Physical Therapy

## 2021-10-01 ENCOUNTER — Other Ambulatory Visit: Payer: Self-pay

## 2021-10-01 DIAGNOSIS — M6281 Muscle weakness (generalized): Secondary | ICD-10-CM | POA: Diagnosis not present

## 2021-10-01 DIAGNOSIS — R2689 Other abnormalities of gait and mobility: Secondary | ICD-10-CM | POA: Diagnosis not present

## 2021-10-01 DIAGNOSIS — R296 Repeated falls: Secondary | ICD-10-CM | POA: Diagnosis not present

## 2021-10-01 NOTE — Therapy (Signed)
[facility] @ [place], [place], [address] Phone: [phone]   Fax:  [phone]  Physical Therapy Treatment  Patient Details  Name: Dorothy White MRN: [id number] Date of Birth: 05-30-1951 Referring Provider (PT): Jonathon Jordan, MD   Encounter Date: 10/01/2021   PT End of Session - 10/01/21 1235     Visit Number 8    Date for PT Re-Evaluation 10/17/21    Authorization Type UHC Medicare    Progress Note Due on Visit 10    PT Start Time 1235    PT Stop Time 1313    PT Time Calculation (min) 38 min    Activity Tolerance Patient tolerated treatment well    Behavior During Therapy [facility] for tasks assessed/performed             Past Medical History:  Diagnosis Date   Anemia    ?   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 25% mLAD, otherwise no sig disease 01/2016 cath.   NICM (nonischemic cardiomyopathy) ([place])    a. EF 40-45% by echo 08/1115 at time of complicated migraine/neuro sx, 55-65% at time of cath 01/2016   Stroke [facility]) 06/2013   "mini" stroke , ?possibly hemaplegic migraine per pt    Past Surgical History:  Procedure Laterality Date   ANTERIOR APPROACH HEMI HIP ARTHROPLASTY Left 04/01/2017   Procedure: LEFT DIRECT ANTERIOR TOTAL HIP REPLACEMENT;  Surgeon: Leandrew Koyanagi, MD;  Location: [place];  Service: Orthopedics;  Laterality: Left;  LEFT DIRECT ANTERIOR TOTAL HIP REPLACEMENT   AUGMENTATION MAMMAPLASTY  1980    BASAL CELL CARCINOMA EXCISION     "tip of my nose"   BUNIONECTOMY Bilateral 10/2003   CARDIAC CATHETERIZATION N/A 02/20/2016   Procedure: Left Heart Cath and Coronary Angiography;  Surgeon: Jettie Booze, MD;  Location: [place] CV [facility];  Service: Cardiovascular;  Laterality: N/A;   COLONOSCOPY     DILATION AND CURETTAGE OF UTERUS     FRACTURE SURGERY     IR GENERIC HISTORICAL  03/18/2016   IR SINUS/FIST TUBE CHK-NON GI 03/18/2016 Aletta Edouard, MD [facility]-INTERV RAD   LAPAROSCOPIC SIGMOID COLECTOMY N/A 04/25/2016   Procedure: LAPAROSCOPIC SIGMOID COLECTOMY;  Surgeon: Stark Klein, MD;  Location: [place];  Service: General;  Laterality: N/A;   OOPHORECTOMY Bilateral ~ 1999   ORIF HUMERUS FRACTURE Right 02/15/2015   Procedure: OPEN REDUCTION INTERNAL FIXATION (ORIF) PROXIMAL HUMERUS FRACTURE;  Surgeon: Netta Cedars, MD;  Location: [place];  Service: Orthopedics;  Laterality: Right;   ORIF HUMERUS FRACTURE Left 04/04/2020   ORIF HUMERUS FRACTURE Left 04/04/2020   Procedure: OPEN REDUCTION INTERNAL FIXATION (ORIF) PROXIMAL HUMERUS FRACTURE;  Surgeon: Iran Planas, MD;  Location: [place];  Service: Orthopedics;  Laterality: Left;  regional block   ORIF ULNAR FRACTURE Left 04/04/2020   ORIF ULNAR FRACTURE Left 04/04/2020   Procedure:  OPEN REDUCTION INTERNAL FIXATION (ORIF) ULNAR FRACTURE;  Surgeon: Iran Planas, MD;  Location: [place];  Service: Orthopedics;  Laterality: Left;  with regional block   [place] Right 08/11/2020   Procedure: TOTAL HIP ARTHROPLASTY ANTERIOR APPROACH;  Surgeon: Rod Can, MD;  [facility];  Service: Orthopedics;  Laterality: Right;   TUBAL LIGATION  ~ [place]  ~ 1998    There were no vitals filed for this visit.   Subjective Assessment - 10/01/21 1237     Subjective I feel about 20% more confident in my walking and balance.  I walked to mailbox without cane.    Pertinent History cervical  and lumbar spondylosis    How long can you walk comfortably? 20 min    Patient Stated Goals reduce stiffness, work on strength and balance    Currently in Pain? No/denies                               [facility] Adult PT Treatment/Exercise - 10/01/21 0001       Knee/Hip Exercises: Standing   Forward Step Up Left;Right;1 set;5 reps;Hand Hold: 1;Step Height: 6"    SLS tap tall cone alt Rt/Lt x 20, then tap 2 cones Lt x 5 reps, Rt x 5 reps, march to heel raise 1UE support x 8 reps each side    Gait Training gait without cane, add balloon batting to self, down long hallway 2x each    Other Standing Knee Exercises high knee march down and back long hallway with cane                 Balance Exercises - 10/01/21 0001       Balance Exercises: Standing   Tandem Gait Forward;Upper extremity support;Retro;3 reps   in parallel bars   Sidestepping Theraband   green loop at ankles, sidestepping and monster walks fwd/bwd no cane, close supervision, 2x across mirror each                 PT Short Term Goals - 09/19/21 1436       PT SHORT TERM GOAL #1   Title Pt will be safe and ind with HEP for functional strength training and balance.    Status Achieved      PT SHORT TERM GOAL #2   Title Pt will be able to particiapte in 6' walk test covering at least 500' using SPC without LOB.    Time 4    Status Achieved      PT SHORT TERM GOAL #3   Title Pt will achieve at least 4-/5 strength score for UEs.    Status Achieved      PT SHORT TERM GOAL #4   Title Pt will improve 5x sit to stand to </= 14"    Status Achieved      PT SHORT TERM GOAL #5   Title Pt will be able to perform SLS at counter with light single UE support x 30" on Rt and Lt LE    Status Achieved               PT Long Term Goals - 09/24/21 1323       PT LONG TERM GOAL #1   Title Pt will improve 5x sit<>stand to less than or equal to 12 sec for improved functional strength and  demo of  reduced fall risk.    Baseline 11 sec    Status Achieved      PT LONG TERM GOAL #3   Title Pt will demo safe step up and step down strategy from curb using SPC to reduce fall risk in the community.    Status Achieved      PT LONG TERM GOAL #4   Title Pt will be able to demo forward and lateral hurdle step over and back and alternating march taps to high cones without LOB to demo improved dynamic balance and safety with stepping strategies and brief SLS tasks.    Status Achieved                   Plan - 10/01/21 1307     Clinical Impression Statement Pt reports 20% improvement in balance confidence and even went to mailbox without cane since last visit.  PT added tband loop resistance today to sidestepping and added monster walks fwd/bwd.  Pt is using less UE support with gait and balance challenges.  PT added high knee march with single heel raise and forward 6" step ups today with good tolerance.  Practiced gait without cane today without LOB although Pt is fearful, slows gait and step length and becomes more rigid with UEs - all of this improved with VCs.    Comorbidities Hx of multiple falls with fractures, osteoporosis, Hx of TIAs.    PT Frequency 2x / week    PT Duration 8 weeks    PT Treatment/Interventions ADLs/Self Care Home Management;Moist Heat;Gait training;Stair training;Functional mobility training;Therapeutic activities;Therapeutic exercise;Balance training;Neuromuscular re-education;Patient/family education;Manual techniques;Passive range of motion;Joint Manipulations    PT Next Visit Plan continue gait without cane, add distractions/layered challenges, high knee march to single heel raise, sit to stand feet on foam pad, fwd step ups, sidestepping and monster walks green band    PT Home Exercise Plan Access Code: [facility]    Consulted and Agree with Plan of Care Patient             Patient will benefit from skilled therapeutic intervention in order to improve  the following deficits and impairments:     Visit Diagnosis: Other abnormalities of gait and mobility  Muscle weakness (generalized)  Repeated falls     Problem List Patient Active Problem List   Diagnosis Date Noted   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   Pain of right hip joint 09/28/2020   Pain in joint of left elbow 04/25/2020   Encounter for orthopedic follow-up care 04/19/2020   Closed comminuted fracture of proximal ulna 04/04/2020   Closed fracture of base of fifth metacarpal bone of left hand 03/29/2020   Closed fracture of proximal end of left humerus 03/29/2020   Closed fracture of olecranon process of left ulna 03/29/2020   Cervical radiculitis 08/11/2019   Lumbar spondylosis 01/11/2019   Urinary tract infectious disease 07/03/2018   Fractured nasal bones 07/03/2018   Knee pain 28/76/8115   Metabolic acidosis, normal anion gap (NAG) 07/03/2018   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   Long-term current use of opiate analgesic 09/08/2017   Recurrent falls 04/16/2017   History of total hip replacement, 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   History of 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 joint 04/16/2017   At risk for adverse drug event 04/07/2017   Postoperative anemia due to acute blood loss 04/04/2017   Delirium 04/04/2017   Osteoporosis 04/02/2017   Closed fracture of neck of left femur ([place])  03/31/2017   Contusion of scalp 03/31/2017   Fall in home 03/31/2017   Hip fracture ([place]) 03/31/2017   Fracture of femur ([place]) 03/31/2017   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   History of transient ischemic attack 02/19/2017   Left carotid artery stenosis 02/19/2017   Protein calorie malnutrition ([place]) 02/18/2017   Deficiency of macronutrients 02/18/2017   Fall 02/17/2017   Chest pain 09/26/2016   Diverticulitis of sigmoid colon 04/25/2016   Infection due to ESBL-producing Escherichia coli/Diverticular Abscess 03/15/2016   History of infection due to ESBL Escherichia coli  03/15/2016   Diverticulitis 03/12/2016   Lesion of pancreas 03/12/2016   History of chest pain 03/12/2016   Hypokalemia 03/12/2016   Angina, class IV (HCC) - chest tightness and pressure with dyspnea with minimal exertion 02/17/2016    Class: Question of   Transient ischemic attack 12/26/2015   Dyspnea on exertion 12/26/2015   Right hemiparesis ([place]) 12/17/2015   Ataxia 12/17/2015   Fracture of shoulder 02/15/2015   Chronic headache 10/11/2013   Chronic headache disorder 10/11/2013   Asthenia 08/12/2013   Dizziness and giddiness 08/12/2013   Abnormal gait 07/11/2013   Hyperlipidemia 05/07/2007   Migraine 05/07/2007   Premature beats 05/07/2007   Gastroesophageal reflux disease 05/07/2007   Diverticulosis of colon 05/07/2007   Degeneration of lumbar intervertebral disc 05/07/2007   Disorder of skeletal system 05/07/2007   Depressive disorder 03/24/2007   Hemorrhoids 03/24/2007   Allergic rhinitis 03/24/2007   Lumbar pain 03/24/2007   MIGRAINES, HX OF 03/24/2007    Baruch Merl, PT 10/01/21 1:16 PM   [facility] @ [place], [place], [address] Phone: [phone]   Fax:  [phone]  Name: Dorothy White MRN: [id number] Date of Birth: 22-Jan-1951

## 2021-10-03 ENCOUNTER — Encounter: Payer: Self-pay | Admitting: Physical Therapy

## 2021-10-07 ENCOUNTER — Telehealth: Payer: Self-pay | Admitting: Neurology

## 2021-10-07 NOTE — Telephone Encounter (Signed)
I called patients PCP today.  Pt has cancelled multiple appts with me (4).  Discussed with PCP that I am concerned that the patients oxycodone #140 per month are causing or contributing to many/most of falls.  Discussed that I was going to do DaT scan and or skin bx for alpha synuclein as I was starting to question dx of MSA.  Unfortunately, due to multiple cx right before appt, we are having to d/c patient from the clinic and let Dr. Stephanie Acre know that as well. ?

## 2021-10-08 ENCOUNTER — Ambulatory Visit: Payer: Medicare Other | Admitting: Neurology

## 2021-10-08 DIAGNOSIS — M950 Acquired deformity of nose: Secondary | ICD-10-CM | POA: Diagnosis not present

## 2021-10-08 DIAGNOSIS — Z029 Encounter for administrative examinations, unspecified: Secondary | ICD-10-CM

## 2021-10-08 DIAGNOSIS — J3489 Other specified disorders of nose and nasal sinuses: Secondary | ICD-10-CM | POA: Diagnosis not present

## 2021-10-09 ENCOUNTER — Encounter: Payer: Self-pay | Admitting: Physical Therapy

## 2021-10-09 ENCOUNTER — Ambulatory Visit: Payer: Medicare Other | Admitting: Physical Therapy

## 2021-10-09 ENCOUNTER — Encounter: Payer: Self-pay | Admitting: Neurology

## 2021-10-09 ENCOUNTER — Other Ambulatory Visit: Payer: Self-pay

## 2021-10-09 DIAGNOSIS — R2689 Other abnormalities of gait and mobility: Secondary | ICD-10-CM

## 2021-10-09 DIAGNOSIS — R296 Repeated falls: Secondary | ICD-10-CM | POA: Diagnosis not present

## 2021-10-09 DIAGNOSIS — M6281 Muscle weakness (generalized): Secondary | ICD-10-CM | POA: Diagnosis not present

## 2021-10-09 NOTE — Therapy (Signed)
[facility] ?[facility] @ [place] [facility], [place], [address] ?Phone: [phone]   Fax:  [phone] ? ?Physical Therapy Treatment ? ?Patient Details  ?Name: Dorothy White ?MRN: [id number] ?Date of Birth: 05-18-1951 ?Referring Provider (PT): Jonathon Jordan, MD ? ? ?Encounter Date: 10/09/2021 ? ? PT End of Session - 10/09/21 1238   ? ? Visit Number 9   ? Date for PT Re-Evaluation 10/17/21   ? Authorization Type UHC Medicare   ? Progress Note Due on Visit 10   ? PT Start Time 1235   ? PT Stop Time 1318   ? PT Time Calculation (min) 43 min   ? Activity Tolerance Patient tolerated treatment well   ? Behavior During Therapy [facility] for tasks assessed/performed   ? ?  ?  ? ?  ? ? ?Past Medical History:  ?Diagnosis Date  ? Anemia   ? ?  ? Anxiety   ? Arthritis   ? "everywhere" (04/25/2016)  ? Basal cell carcinoma of left nasal tip   ? Chronic back pain   ? "all over" (04/25/2016)  ? Constipation   ? Depression   ? Diverticulitis   ? GERD (gastroesophageal reflux disease)   ? Head injury 2019  ? subdural hematoma  ? Headache   ? "weekly" (04/25/2016)  ? 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 History of blood transfusion   ? HLD (hyperlipidemia)   ? hx (04/25/2016)  ? Migraine   ? "3/wk sometimes; other times weekly; recently had Hemiplegic migraine" (04/25/2016)  ? Mild CAD   ? a. 25% mLAD, otherwise no sig disease 01/2016 cath.  ? NICM (nonischemic cardiomyopathy) ([place])   ? a. EF 40-45% by echo 08/4095 at time of complicated migraine/neuro sx, 55-65% at time of cath 01/2016  ? Stroke [facility]) 06/2013  ? "mini" stroke , ?possibly hemaplegic migraine per pt  ? ? ?Past Surgical History:  ?Procedure Laterality Date  ? ANTERIOR APPROACH HEMI HIP ARTHROPLASTY Left 04/01/2017  ? Procedure: LEFT DIRECT ANTERIOR TOTAL HIP REPLACEMENT;  Surgeon: Leandrew Koyanagi, MD;  Location: [place];  Service: Orthopedics;  Laterality: Left;  LEFT DIRECT ANTERIOR TOTAL HIP REPLACEMENT  ? [place]  ?  BASAL CELL CARCINOMA EXCISION    ? "tip of my nose"  ? BUNIONECTOMY Bilateral 10/2003  ? CARDIAC CATHETERIZATION N/A 02/20/2016  ? Procedure: Left Heart Cath and Coronary Angiography;  Surgeon: Jettie Booze, MD;  Location: [place] [facility] LAB;  Service: Cardiovascular;  Laterality: N/A;  ? COLONOSCOPY    ? DILATION AND CURETTAGE OF UTERUS    ? FRACTURE SURGERY    ? IR GENERIC HISTORICAL  03/18/2016  ? IR SINUS/FIST TUBE CHK-NON GI 03/18/2016 Aletta Edouard, MD [facility]INTER[facility] RAD  ? LAPAROSCOPIC SIGMOID COLECTOMY N/A 04/25/2016  ? Procedure: LAPAROSCOPIC SIGMOID COLECTOMY;  Surgeon: Stark Klein, MD;  Location: [place];  Service: General;  Laterality: N/A;  ? OOPHORECTOMY Bilateral ~ 1999  ? ORIF HUMERUS FRACTURE Right 02/15/2015  ? Procedure: OPEN REDUCTION INTERNAL FIXATION (ORIF) PROXIMAL HUMERUS FRACTURE;  Surgeon: Netta Cedars, MD;  Location: [place];  Service: Orthopedics;  Laterality: Right;  ? ORIF HUMERUS FRACTURE Left 04/04/2020  ? ORIF HUMERUS FRACTURE Left 04/04/2020  ? Procedure: OPEN REDUCTION INTERNAL FIXATION (ORIF) PROXIMAL HUMERUS FRACTURE;  Surgeon: Iran Planas, MD;  Location: [place];  Service: Orthopedics;  Laterality: Left;  regional block  ? ORIF ULNAR FRACTURE Left 04/04/2020  ? ORIF ULNAR FRACTURE Left 04/04/2020  ? Procedure:  OPEN REDUCTION INTERNAL FIXATION (ORIF) ULNAR FRACTURE;  Surgeon: Iran Planas, MD;  Location: [place];  Service: Orthopedics;  Laterality: Left;  with regional block  ? RHINOPLASTY  1976  ? TONSILLECTOMY    ? TOTAL HIP ARTHROPLASTY Right 08/11/2020  ? Procedure: TOTAL HIP ARTHROPLASTY ANTERIOR APPROACH;  Surgeon: Rod Can, MD;  [facility];  Service: Orthopedics;  Laterality: Right;  ? TUBAL LIGATION  ~ 1983  ? VAGINAL HYSTERECTOMY  ~ 1998  ? ? ?There were no vitals filed for this visit. ? ? Subjective Assessment - 10/09/21 1238   ? ? Subjective Sessions are going well - PT is helping.  I burned my Lt hand so I can't grip anything today.   ? Pertinent History cervical  and lumbar spondylosis   ? How long can you walk comfortably? 20 min   ? Patient Stated Goals reduce stiffness, work on strength and balance   ? Currently in Pain? Yes   ? Pain Score 5    ? Pain Location Back   ? ?  ?  ? ?  ? ? ? ? ? [facility] PT Assessment - 10/09/21 0001   ? ?  ? Functional Gait  Assessment  ? Gait assessed  Yes   ? Gait Level Surface Walks 20 ft in less than 7 sec but greater than 5.5 sec, uses assistive device, slower speed, mild gait deviations, or deviates 6-10 in outside of the 12 in walkway width.   ? Change in Gait Speed Able to smoothly change walking speed without loss of balance or gait deviation. Deviate no more than 6 in outside of the 12 in walkway width.   ? Gait with Horizontal Head Turns Performs head turns smoothly with slight change in gait velocity (eg, minor disruption to smooth gait path), deviates 6-10 in outside 12 in walkway width, or uses an assistive device.   ? Gait with Vertical Head Turns Performs task with slight change in gait velocity (eg, minor disruption to smooth gait path), deviates 6 - 10 in outside 12 in walkway width or uses assistive device   ? Gait and Pivot Turn Pivot turns safely within 3 sec and stops quickly with no loss of balance.   ? Step Over Obstacle Is able to step over one shoe box (4.5 in total height) without changing gait speed. No evidence of imbalance.   ? Gait with Narrow Base of Support Is able to ambulate for 10 steps heel to toe with no staggering.   ? Gait with Eyes Closed Walks 20 ft, no assistive devices, good speed, no evidence of imbalance, normal gait pattern, deviates no more than 6 in outside 12 in walkway width. Ambulates 20 ft in less than 7 sec.   ? Ambulating Backwards Walks 20 ft, uses assistive device, slower speed, mild gait deviations, deviates 6-10 in outside 12 in walkway width.   ? Steps Alternating feet, must use rail.   ? Total Score 24   ? FGA comment: 24/30   initial score 14/30  ? ?  ?  ? ?   ? ? ? ? ? ? ? ? ? ? ? ? ? ? ? ? [place] Adult PT Treatment/Exercise - 10/09/21 0001   ? ?  ? Ambulation/Gait  ? Ambulation/Gait Yes   ? Gait Comments 5' warm up walk without cane close supervision by PT, VC to take bigger steps   ?  ? Lumbar Exercises: Seated  ? Sit to Stand 20 reps   ?  Sit to Stand Limitations 2x10   ? ?  ?  ? ?  ? ? ? ? ? ? Balance Exercises - 10/09/21 0001   ? ?  ? Balance Exercises: Standing  ? Sidestepping Theraband;2 reps;Other (comment)   yellow band 1 round, green band 1 round, with monster walks fwd/bwd  ? Other Standing Exercises mulitple reps of all FGA tests for gait/balance practice   ? ?  ?  ? ?  ? ? ? ? ? ? ? PT Short Term Goals - 09/19/21 1436   ? ?  ? PT SHORT TERM GOAL #1  ? Title Pt will be safe and ind with HEP for functional strength training and balance.   ? Status Achieved   ?  ? PT SHORT TERM GOAL #2  ? Title Pt will be able to particiapte in 6' walk test covering at least 500' using SPC without LOB.   ? Time 4   ? Status Achieved   ?  ? PT SHORT TERM GOAL #3  ? Title Pt will achieve at least 4-/5 strength score for UEs.   ? Status Achieved   ?  ? PT SHORT TERM GOAL #4  ? Title Pt will improve 5x sit to stand to </= 14"   ? Status Achieved   ?  ? PT SHORT TERM GOAL #5  ? Title Pt will be able to perform SLS at counter with light single UE support x 30" on Rt and Lt LE   ? Status Achieved   ? ?  ?  ? ?  ? ? ? ? PT Long Term Goals - 10/09/21 1313   ? ?  ? PT LONG TERM GOAL #1  ? Title Pt will improve 5x sit<>stand to less than or equal to 12 sec for improved functional strength and demo of reduced fall risk.   ? Status Achieved   ?  ? PT LONG TERM GOAL #2  ? Title Pt will improve FGA to score of at least 20/30 to demo less fall risk.   ? Baseline 24/30   ? Status Achieved   ?  ? PT LONG TERM GOAL #3  ? Title Pt will demo safe step up and step down strategy from curb using SPC to reduce fall risk in the community.   ? Status Achieved   ?  ? PT LONG TERM GOAL #4  ? Title Pt will be  able to demo forward and lateral hurdle step over and back and alternating march taps to high cones without LOB to demo improved dynamic balance and safety with stepping strategies and brief SLS tasks.   ? Status Achieved   ?  ?

## 2021-10-11 ENCOUNTER — Telehealth: Payer: Self-pay | Admitting: Neurology

## 2021-10-11 NOTE — Telephone Encounter (Signed)
Patient dismissed from [facility] by Wells Guiles Tat, DO, effective 10/09/21. Dismissal Letter sent out by 1st class mail. K[facility]   ?

## 2021-10-14 DIAGNOSIS — T23232S Burn of second degree of multiple left fingers (nail), not including thumb, sequela: Secondary | ICD-10-CM | POA: Diagnosis not present

## 2021-10-15 ENCOUNTER — Ambulatory Visit: Payer: Medicare Other | Admitting: Physical Therapy

## 2021-10-15 ENCOUNTER — Encounter: Payer: Self-pay | Admitting: Physical Therapy

## 2021-10-15 ENCOUNTER — Other Ambulatory Visit: Payer: Self-pay

## 2021-10-15 DIAGNOSIS — R296 Repeated falls: Secondary | ICD-10-CM

## 2021-10-15 DIAGNOSIS — M6281 Muscle weakness (generalized): Secondary | ICD-10-CM | POA: Diagnosis not present

## 2021-10-15 DIAGNOSIS — R2689 Other abnormalities of gait and mobility: Secondary | ICD-10-CM | POA: Diagnosis not present
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
## 2021-10-17 ENCOUNTER — Ambulatory Visit: Payer: Self-pay | Admitting: Physical Therapy

## 2021-10-17 ENCOUNTER — Other Ambulatory Visit: Payer: Medicare Other | Admitting: Family Medicine

## 2021-10-17 ENCOUNTER — Telehealth: Payer: Self-pay | Admitting: Physical Therapy

## 2021-10-17 ENCOUNTER — Other Ambulatory Visit: Payer: Self-pay

## 2021-10-17 VITALS — BP 146/78 | HR 70 | Temp 97.9°F

## 2021-10-17 DIAGNOSIS — T23262D Burn of second degree of back of left hand, subsequent encounter: Secondary | ICD-10-CM

## 2021-10-17 DIAGNOSIS — T23002A Burn of unspecified degree of left hand, unspecified site, initial encounter: Secondary | ICD-10-CM | POA: Diagnosis not present

## 2021-10-17 DIAGNOSIS — K5900 Constipation, unspecified: Secondary | ICD-10-CM | POA: Diagnosis not present

## 2021-10-17 DIAGNOSIS — Z515 Encounter for palliative care: Secondary | ICD-10-CM

## 2021-10-17 DIAGNOSIS — R296 Repeated falls: Secondary | ICD-10-CM

## 2021-10-17 NOTE — Telephone Encounter (Signed)
Pt missed PT reassessment appointment on 10/17/21 at 2pm.  Left voicemail for Pt to call clinic so we can confirm next scheduled visit. ? ?Baruch Merl, PT ?10/17/21 2:20 PM ? ?

## 2021-10-18 ENCOUNTER — Encounter: Payer: Self-pay | Admitting: Family Medicine
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
## 2021-10-19 ENCOUNTER — Encounter: Payer: Self-pay | Admitting: Family Medicine

## 2021-10-19 DIAGNOSIS — Z515 Encounter for palliative care: Secondary | ICD-10-CM | POA: Insufficient documentation

## 2021-10-19 DIAGNOSIS — T23262A Burn of second degree of back of left hand, initial encounter: Secondary | ICD-10-CM | POA: Insufficient documentation

## 2021-10-23 ENCOUNTER — Ambulatory Visit: Payer: Medicare Other | Admitting: Physical Therapy

## 2021-10-23 ENCOUNTER — Encounter: Payer: Self-pay | Admitting: Physical Therapy

## 2021-10-23 DIAGNOSIS — M6281 Muscle weakness (generalized): Secondary | ICD-10-CM | POA: Diagnosis not present

## 2021-10-23 DIAGNOSIS — R296 Repeated falls: Secondary | ICD-10-CM

## 2021-10-23 DIAGNOSIS — R2689 Other abnormalities of gait and mobility: Secondary | ICD-10-CM | POA: Diagnosis not present

## 2021-10-23 NOTE — Therapy (Signed)
[place] ?[facility] ?[place], [place], [address] ?Phone: [phone]   Fax:  [phone] ? ?Physical Therapy Treatment ? ?Patient Details  ?Name: MARKESHA HANNIG ?MRN: [id number] ?Date of Birth: [birth year] ?Referring Provider (PT): Jonathon Jordan, MD ? ? ?Encounter Date: 10/23/2021 ? ? PT End of Session - 10/23/21 1235   ? ? Visit Number 11   ? Date for PT Re-Evaluation 10/17/21   ? Authorization Type U[employer] Medicare   ? Progress Note Due on Visit 20   ? PT Start Time 1234   ? PT Stop Time 1310   ? PT Time Calculation (min) 36 min   ? Equipment Utilized During Treatment Gait belt   ? Activity Tolerance Patient tolerated treatment well   ? Behavior During Therapy [facility] for tasks assessed/performed   ? ?  ?  ? ?  ? ? ?Past Medical History:  ?Diagnosis Date  ? Anemia   ? ?  ? Anxiety   ? Arthritis   ? "everywhere" (04/25/2016)  ? Basal cell carcinoma of left nasal tip   ? Chronic back pain   ? "all over" (04/25/2016)  ? Constipation   ? Depression   ? Diverticulitis   ? Diverticulitis of sigmoid colon 04/25/2016  ? GERD (gastroesophageal reflux disease)   ? Head injury 2019  ? subdural hematoma  ? Headache   ? "weekly" (04/25/2016)  ? 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 History of blood transfusion   ? HLD (hyperlipidemia)   ? hx (04/25/2016)  ? Migraine   ? "3/wk sometimes; other times weekly; recently had Hemiplegic migraine" (04/25/2016)  ? Mild CAD   ? a. 25% mLAD, otherwise no sig disease 01/2016 cath.  ? NICM (nonischemic cardiomyopathy) ([place])   ? a. EF 40-45% by echo 08/3760 at time of complicated migraine/neuro sx, 55-65% at time of cath 01/2016  ? Stroke [facility]) 06/2013  ? "mini" stroke , ?possibly hemaplegic migraine per pt  ? ? ?Past Surgical History:  ?Procedure Laterality Date  ? ANTERIOR APPROACH HEMI HIP ARTHROPLASTY Left 04/01/2017  ? Procedure: LEFT DIRECT ANTERIOR TOTAL HIP REPLACEMENT;  Surgeon: Leandrew Koyanagi, MD;  Location: [place];  Service: Orthopedics;   Laterality: Left;  LEFT DIRECT ANTERIOR TOTAL HIP REPLACEMENT  ? [place]  ? BASAL CELL CARCINOMA EXCISION    ? "tip of my nose"  ? BUNIONECTOMY Bilateral 10/2003  ? CARDIAC CATHETERIZATION N/A 02/20/2016  ? Procedure: Left Heart Cath and Coronary Angiography;  Surgeon: Jettie Booze, MD;  Location: [place] CV [facility];  Service: Cardiovascular;  Laterality: N/A;  ? COLONOSCOPY    ? DILATION AND CURETTAGE OF UTERUS    ? FRACTURE SURGERY    ? IR GENERIC HISTORICAL  03/18/2016  ? IR SINUS/FIST TUBE CHK-NON GI 03/18/2016 Aletta Edouard, MD [facility]-INTERV RAD  ? LAPAROSCOPIC SIGMOID COLECTOMY N/A 04/25/2016  ? Procedure: LAPAROSCOPIC SIGMOID COLECTOMY;  Surgeon: Stark Klein, MD;  Location: [place];  Service: General;  Laterality: N/A;  ? OOPHORECTOMY Bilateral ~ 1999  ? ORIF HUMERUS FRACTURE Right 02/15/2015  ? Procedure: OPEN REDUCTION INTERNAL FIXATION (ORIF) PROXIMAL HUMERUS FRACTURE;  Surgeon: Netta Cedars, MD;  Location: [place];  Service: Orthopedics;  Laterality: Right;  ? ORIF HUMERUS FRACTURE Left 04/04/2020  ? ORIF HUMERUS FRACTURE Left 04/04/2020  ? Procedure: OPEN REDUCTION INTERNAL FIXATION (ORIF) PROXIMAL HUMERUS FRACTURE;  Surgeon: Iran Planas, MD;  Location: [place];  Service: Orthopedics;  Laterality: Left;  regional block  ?  ORIF ULNAR FRACTURE Left 04/04/2020  ? ORIF ULNAR FRACTURE Left 04/04/2020  ? Procedure: OPEN REDUCTION INTERNAL FIXATION (ORIF) ULNAR FRACTURE;  Surgeon: Iran Planas, MD;  Location: [place];  Service: Orthopedics;  Laterality: Left;  with regional block  ? RHINOPLASTY  1976  ? TONSILLECTOMY    ? TOTAL HIP ARTHROPLASTY Right 08/11/2020  ? Procedure: TOTAL HIP ARTHROPLASTY ANTERIOR APPROACH;  Surgeon: Rod Can, MD;  [facility];  Service: Orthopedics;  Laterality: Right;  ? TUBAL LIGATION  ~ 1983  ? VAGINAL HYSTERECTOMY  ~ 1998  ? ? ?There were no vitals filed for this visit. ? ? Subjective Assessment - 10/23/21 1237   ? ? Subjective I feel 40% improvement in my  balance and stability.  I want to work more on my strength.  I have occassional balance loss and am better able to steady myself.   ? Pertinent History cervical and lumbar spondylosis   ? How long can you walk comfortably? 20 min   ? Patient Stated Goals reduce stiffness, work on strength and balance   ? Currently in Pain? Yes   ? Pain Score 4    ? Pain Location Back   ? Pain Orientation Left;Right;Lateral   ? Pain Descriptors / Indicators Tightness   ? Pain Type Chronic pain   ? Pain Onset More than a month ago   ? Pain Frequency Constant   ? ?  ?  ? ?  ? ? ? ? ? [facility] PT Assessment - 10/23/21 0001   ? ?  ? Assessment  ? Medical Diagnosis R26.89 (ICD-10-CM) - Other abnormalities of gait and mobility   ? Referring Provider (PT) Jonathon Jordan, MD   ? Onset Date/Surgical Date --   years, chronic pain  ? Hand Dominance Right   ? Next MD Visit 08/28/21   physical  ? Prior Therapy yes   ?  ? Strength  ? Overall Strength Comments UEs 4-/5 throughout, LEs 4/5 throughout   ?  ? Ambulation/Gait  ? Gait Comments able to walk without SPC x 5' without LOB   ?  ? Functional Gait  Assessment  ? Gait assessed  Yes   ? Gait Level Surface Walks 20 ft in less than 7 sec but greater than 5.5 sec, uses assistive device, slower speed, mild gait deviations, or deviates 6-10 in outside of the 12 in walkway width.   ? Change in Gait Speed Able to smoothly change walking speed without loss of balance or gait deviation. Deviate no more than 6 in outside of the 12 in walkway width.   ? Gait with Horizontal Head Turns Performs head turns smoothly with slight change in gait velocity (eg, minor disruption to smooth gait path), deviates 6-10 in outside 12 in walkway width, or uses an assistive device.   ? Gait with Vertical Head Turns Performs task with slight change in gait velocity (eg, minor disruption to smooth gait path), deviates 6 - 10 in outside 12 in walkway width or uses assistive device   ? Gait and Pivot Turn Pivot turns safely within  3 sec and stops quickly with no loss of balance.   ? Step Over Obstacle Is able to step over one shoe box (4.5 in total height) without changing gait speed. No evidence of imbalance.   ? Gait with Narrow Base of Support Is able to ambulate for 10 steps heel to toe with no staggering.   ? Gait with Eyes Closed Walks 20 ft, no  assistive devices, good speed, no evidence of imbalance, normal gait pattern, deviates no more than 6 in outside 12 in walkway width. Ambulates 20 ft in less than 7 sec.   ? Ambulating Backwards Walks 20 ft, uses assistive device, slower speed, mild gait deviations, deviates 6-10 in outside 12 in walkway width.   ? Steps Alternating feet, must use rail.   ? Total Score 24   ? FGA comment: 24/30   initial score 14/30  ? ?  ?  ? ?  ? ? ? ? ? ? ? ? ? ? ? ? ? ? ? ? [place] Adult PT Treatment/Exercise - 10/23/21 0001   ? ?  ? Exercises  ? Exercises Shoulder;Lumbar;Knee/Hip;Elbow   ?  ? Elbow Exercises  ? Bar Weights/Barbell (Elbow Flexion) 2 lbs   ? Elbow Flexion Limitations 20 rep, standing   ?  ? Lumbar Exercises: Aerobic  ? Nustep L3 x 5' PT present to discuss progress   ?  ? Knee/Hip Exercises: Standing  ? Heel Raises 15 reps   ? Heel Raises Limitations no UE support   ? Hip Flexion Both;1 set;5 reps;Knee straight   ? Hip Flexion Limitations at barre   ? Hip Abduction AROM;1 set;5 sets;Knee straight   ? Abduction Limitations at barre   ? Hip Extension AROM;1 set;5 reps;Knee straight   ? Extension Limitations at barre   ?  ? Knee/Hip Exercises: Seated  ? Long [employer] Strengthening;Both;2 sets;10 reps;Weights   ? Long Arc Quad Weight 2 lbs.   ? Hamstring Curl Strengthening;Both;2 sets;10 reps   ? Hamstring Limitations red loop band at ankles   ? Sit to Sand 2 sets;10 reps;without UE support   hold 5lb kbell  ?  ? Shoulder Exercises: Standing  ? Extension Strengthening;Theraband;20 reps;Both   ? Theraband Level (Shoulder Extension) Level 1 (Yellow)   ? Row Strengthening;Both;20 reps;Theraband   ?  Theraband Level (Shoulder Row) Level 2 (Red)   ? Other Standing Exercises 2-way raises flex/scaption 1x8 2lb bil UE dumbbells, standing on foam pad in narrow BOS for trunk challenge   ? ?  ?  ? ?  ? ? ? ? ?

## 2021-10-24 DIAGNOSIS — M5030 Other cervical disc degeneration, unspecified cervical region: Secondary | ICD-10-CM | POA: Diagnosis not present

## 2021-10-24 DIAGNOSIS — M79642 Pain in left hand: Secondary | ICD-10-CM | POA: Diagnosis not present

## 2021-10-24 DIAGNOSIS — M5136 Other intervertebral disc degeneration, lumbar region: Secondary | ICD-10-CM | POA: Diagnosis not present

## 2021-10-25 ENCOUNTER — Other Ambulatory Visit: Payer: Self-pay | Admitting: Neurology

## 2021-10-25 DIAGNOSIS — G238 Other specified degenerative diseases of basal ganglia: Secondary | ICD-10-CM

## 2021-10-25 DIAGNOSIS — R270 Ataxia, unspecified: Secondary | ICD-10-CM

## 2021-10-25 DIAGNOSIS — F4321 Adjustment disorder with depressed mood: Secondary | ICD-10-CM

## 2021-10-25 DIAGNOSIS — R292 Abnormal reflex: Secondary | ICD-10-CM

## 2021-10-28 ENCOUNTER — Other Ambulatory Visit: Payer: Self-pay

## 2021-10-31 ENCOUNTER — Encounter: Payer: Self-pay | Admitting: Physical Therapy

## 2021-11-05 ENCOUNTER — Emergency Department (HOSPITAL_COMMUNITY)
Admission: EM | Admit: 2021-11-05 | Discharge: 2021-11-05 | Discharge status: Against Medical Advice | Payer: Medicare Other | Attending: Emergency Medicine | Admitting: Emergency Medicine

## 2021-11-05 ENCOUNTER — Encounter (HOSPITAL_COMMUNITY): Payer: Self-pay | Admitting: Emergency Medicine

## 2021-11-05 ENCOUNTER — Emergency Department (HOSPITAL_COMMUNITY): Payer: Medicare Other

## 2021-11-05 ENCOUNTER — Ambulatory Visit: Payer: Medicare Other | Attending: Family Medicine

## 2021-11-05 DIAGNOSIS — I10 Essential (primary) hypertension: Secondary | ICD-10-CM | POA: Insufficient documentation

## 2021-11-05 DIAGNOSIS — R1032 Left lower quadrant pain: Secondary | ICD-10-CM | POA: Diagnosis not present

## 2021-11-05 DIAGNOSIS — R2689 Other abnormalities of gait and mobility: Secondary | ICD-10-CM | POA: Insufficient documentation

## 2021-11-05 DIAGNOSIS — R079 Chest pain, unspecified: Secondary | ICD-10-CM | POA: Diagnosis not present

## 2021-11-05 DIAGNOSIS — R197 Diarrhea, unspecified: Secondary | ICD-10-CM | POA: Insufficient documentation

## 2021-11-05 DIAGNOSIS — R296 Repeated falls: Secondary | ICD-10-CM | POA: Insufficient documentation

## 2021-11-05 DIAGNOSIS — R002 Palpitations: Secondary | ICD-10-CM | POA: Diagnosis not present

## 2021-11-05 DIAGNOSIS — R112 Nausea with vomiting, unspecified: Secondary | ICD-10-CM | POA: Insufficient documentation

## 2021-11-05 DIAGNOSIS — Z8673 Personal history of transient ischemic attack (TIA), and cerebral infarction without residual deficits: Secondary | ICD-10-CM | POA: Insufficient documentation

## 2021-11-05 DIAGNOSIS — R109 Unspecified abdominal pain: Secondary | ICD-10-CM | POA: Diagnosis not present

## 2021-11-05 DIAGNOSIS — M6281 Muscle weakness (generalized): Secondary | ICD-10-CM | POA: Insufficient documentation

## 2021-11-05 DIAGNOSIS — R0789 Other chest pain: Secondary | ICD-10-CM | POA: Diagnosis not present

## 2021-11-05 DIAGNOSIS — Z5321 Procedure and treatment not carried out due to patient leaving prior to being seen by health care provider: Secondary | ICD-10-CM | POA: Diagnosis not present

## 2021-11-05 LAB — COMPREHENSIVE METABOLIC PANEL
ALT: 10 U/L (ref 0–44)
AST: 16 U/L (ref 15–41)
Albumin: 4 g/dL (ref 3.5–5.0)
Alkaline Phosphatase: 119 U/L (ref 38–126)
Anion gap: 6 (ref 5–15)
BUN: 17 mg/dL (ref 8–23)
CO2: 26 mmol/L (ref 22–32)
Calcium: 9.4 mg/dL (ref 8.9–10.3)
Chloride: 110 mmol/L (ref 98–111)
Creatinine, Ser: 0.68 mg/dL (ref 0.44–1.00)
GFR, Estimated: >60 mL/min (ref >60)
Glucose, Bld: 114 mg/dL — ABNORMAL HIGH (ref 70–99)
Potassium: 3.9 mmol/L (ref 3.5–5.1)
Sodium: 142 mmol/L (ref 135–145)
Total Bilirubin: 0.4 mg/dL (ref 0.3–1.2)
Total Protein: 7.1 g/dL (ref 6.5–8.1)

## 2021-11-05 LAB — CBC
HCT: 39.5 % (ref 36.0–46.0)
Hemoglobin: 11.3 g/dL — ABNORMAL LOW (ref 12.0–15.0)
MCH: 20.8 pg — ABNORMAL LOW (ref 26.0–34.0)
MCHC: 28.6 g/dL — ABNORMAL LOW (ref 30.0–36.0)
MCV: 72.7 fL — ABNORMAL LOW (ref 80.0–100.0)
Platelets: 337 10*3/uL (ref 150–400)
RBC: 5.43 MIL/uL — ABNORMAL HIGH (ref 3.87–5.11)
RDW: 20.4 % — ABNORMAL HIGH (ref 11.5–15.5)
WBC: 8.8 10*3/uL (ref 4.0–10.5)
nRBC: 0 % (ref 0.0–0.2)

## 2021-11-05 LAB — TROPONIN I (HIGH SENSITIVITY)
Troponin I (High Sensitivity): 3 ng/L (ref <18)
Troponin I (High Sensitivity): 3 ng/L (ref <18)

## 2021-11-05 LAB — LIPASE, BLOOD: Lipase: 26 U/L (ref 11–51)

## 2021-11-05 MED ORDER — ONDANSETRON 4 MG PO TBDP: 4.0000 mg | ORAL_TABLET | Freq: Once | ORAL | Status: DC

## 2021-11-05 NOTE — ED Triage Notes (Signed)
Per pt, states she has been having left lower abdominal pain, nausea and diarrhea for 5 days-states she has a history of diverticulitis  ?

## 2021-11-05 NOTE — ED Provider Triage Note (Signed)
Emergency Medicine Provider Triage Evaluation Note ? ?Dorothy White , a 71 y.o. female  was evaluated in triage.  Pt complains of abdominal pain with chest pain, N/V and diarrhea.  Started about 5 days ago but worsened in the last 24 hours.  Vomiting is bilious.  Diarrhea non-bloody.  Chest pain central, dull, and not reproducible.  No radiation to arms or jaw.  Intermittent palpitations that have worsened over last 2 weeks.  Denies numbness/tingling of extremities.  Denies recent sick contacts.  Hx of diverticulitis with resulting colon resection about 4-5 yrs ago.  Hx TIA, HTN, and hyperlipidemia. ? ?Review of Systems  ?Positive: As above ?Negative: As above ? ?Physical Exam  ?BP 138/81 (BP Location: Right Arm)   Pulse 87   Temp 98.3 ?F (36.8 ?C) (Oral)   Resp 18   SpO2 95%  ?Gen:   Awake, no distress   ?Resp:  Normal effort, CTAB ?MSK:   Moves extremities without difficulty ?Other:  Radial and DP pulses 2+ bilaterally.  Generalized abdominal tenderness with focus over LLQ.  RRR w/o M/R/G. ? ?Medical Decision Making  ?Medically screening exam initiated at 2:15 PM.  Appropriate orders placed.  Dorothy White was informed that the remainder of the evaluation will be completed by another provider, this initial triage assessment does not replace that evaluation, and the importance of remaining in the ED until their evaluation is complete. ? ?Labs, imaging, EKG ordered ?  ?Prince Rome, PA-C ?19/41/74 1427 ? ?

## 2021-11-06 ENCOUNTER — Other Ambulatory Visit: Payer: Self-pay | Admitting: Neurology

## 2021-11-06 DIAGNOSIS — G903 Multi-system degeneration of the autonomic nervous system: Secondary | ICD-10-CM

## 2021-11-06 DIAGNOSIS — G238 Other specified degenerative diseases of basal ganglia: Secondary | ICD-10-CM

## 2021-11-06 DIAGNOSIS — R292 Abnormal reflex: Secondary | ICD-10-CM

## 2021-11-06 DIAGNOSIS — R270 Ataxia, unspecified: Secondary | ICD-10-CM

## 2021-11-06 DIAGNOSIS — F4321 Adjustment disorder with depressed mood: Secondary | ICD-10-CM

## 2021-11-07 ENCOUNTER — Ambulatory Visit: Payer: Medicare Other

## 2021-11-13 ENCOUNTER — Other Ambulatory Visit: Payer: Medicare Other | Admitting: Family Medicine

## 2021-11-14 ENCOUNTER — Ambulatory Visit: Payer: Medicare Other | Admitting: Physical Therapy

## 2021-11-19 ENCOUNTER — Ambulatory Visit: Payer: Medicare Other

## 2021-11-19 ENCOUNTER — Telehealth: Payer: Self-pay

## 2021-11-19 DIAGNOSIS — M6281 Muscle weakness (generalized): Secondary | ICD-10-CM | POA: Diagnosis not present

## 2021-11-19 DIAGNOSIS — R296 Repeated falls: Secondary | ICD-10-CM | POA: Diagnosis not present

## 2021-11-19 DIAGNOSIS — R2689 Other abnormalities of gait and mobility: Secondary | ICD-10-CM

## 2021-11-19 NOTE — Therapy (Signed)
[place] ?[facility] [place] ?[place], [place], [address] ?Phone: [phone]   Fax:  [phone] ? ?Physical Therapy Treatment ? ?Patient Details  ?Name: Dorothy White ?MRN: [id number] ?Date of Birth: 1950-11-15 ?Referring Provider (PT): Jonathon Jordan, MD ? ? ?Encounter Date: 11/19/2021 ? ? PT End of Session - 11/19/21 1408   ? ? Visit Number 12   ? Date for PT Re-Evaluation 12/12/21   ? Authorization Type UHC Medicare   ? Progress Note Due on Visit 20   ? PT Start Time 1400   ? PT Stop Time 1440   ? PT Time Calculation (min) 40 min   ? Activity Tolerance Patient tolerated treatment well   ? Behavior During Therapy [facility] for tasks assessed/performed   ? ?  ?  ? ?  ? ? ?Past Medical History:  ?Diagnosis Date  ? Anemia   ? ?  ? Anxiety   ? Arthritis   ? "everywhere" (04/25/2016)  ? Basal cell carcinoma of left nasal tip   ? Chronic back pain   ? "all over" (04/25/2016)  ? Constipation   ? Depression   ? Diverticulitis   ? Diverticulitis of sigmoid colon 04/25/2016  ? GERD (gastroesophageal reflux disease)   ? Head injury 2019  ? subdural hematoma  ? Headache   ? "weekly" (04/25/2016)  ? 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 History of blood transfusion   ? HLD (hyperlipidemia)   ? hx (04/25/2016)  ? Migraine   ? "3/wk sometimes; other times weekly; recently had Hemiplegic migraine" (04/25/2016)  ? Mild CAD   ? a. 25% mLAD, otherwise no sig disease 01/2016 cath.  ? NICM (nonischemic cardiomyopathy) ([place])   ? a. EF 40-45% by echo 12/9627 at time of complicated migraine/neuro sx, 55-65% at time of cath 01/2016  ? Stroke [facility]) 06/2013  ? "mini" stroke , ?possibly hemaplegic migraine per pt  ? ? ?Past Surgical History:  ?Procedure Laterality Date  ? ANTERIOR APPROACH HEMI HIP ARTHROPLASTY Left 04/01/2017  ? Procedure: LEFT DIRECT ANTERIOR TOTAL HIP REPLACEMENT;  Surgeon: Leandrew Koyanagi, MD;  Location: [place];  Service: Orthopedics;  Laterality: Left;  LEFT DIRECT ANTERIOR TOTAL HIP  REPLACEMENT  ? [place]  ? BASAL CELL CARCINOMA EXCISION    ? "tip of my nose"  ? BUNIONECTOMY Bilateral 10/2003  ? CARDIAC CATHETERIZATION N/A 02/20/2016  ? Procedure: Left Heart Cath and Coronary Angiography;  Surgeon: Jettie Booze, MD;  Location: [place] [facility];  Service: Cardiovascular;  Laterality: N/A;  ? COLONOSCOPY    ? DILATION AND CURETTAGE OF UTERUS    ? FRACTURE SURGERY    ? IR GENERIC HISTORICAL  03/18/2016  ? IR SINUS/FIST TUBE CHK-NON GI 03/18/2016 Aletta Edouard, MD [facility]-INTER[facility] RAD  ? LAPAROSCOPIC SIGMOID COLECTOMY N/A 04/25/2016  ? Procedure: LAPAROSCOPIC SIGMOID COLECTOMY;  Surgeon: Stark Klein, MD;  Location: [place];  Service: General;  Laterality: N/A;  ? OOPHORECTOMY Bilateral ~ 1999  ? ORIF HUMERUS FRACTURE Right 02/15/2015  ? Procedure: OPEN REDUCTION INTERNAL FIXATION (ORIF) PROXIMAL HUMERUS FRACTURE;  Surgeon: Netta Cedars, MD;  Location: [place];  Service: Orthopedics;  Laterality: Right;  ? ORIF HUMERUS FRACTURE Left 04/04/2020  ? ORIF HUMERUS FRACTURE Left 04/04/2020  ? Procedure: OPEN REDUCTION INTERNAL FIXATION (ORIF) PROXIMAL HUMERUS FRACTURE;  Surgeon: Iran Planas, MD;  Location: [place];  Service: Orthopedics;  Laterality: Left;  regional block  ? ORIF ULNAR FRACTURE Left 04/04/2020  ? ORIF  ULNAR FRACTURE Left 04/04/2020  ? Procedure: OPEN REDUCTION INTERNAL FIXATION (ORIF) ULNAR FRACTURE;  Surgeon: Iran Planas, MD;  Location: [place];  Service: Orthopedics;  Laterality: Left;  with regional block  ? RHINOPLASTY  1976  ? TONSILLECTOMY    ? TOTAL HIP ARTHROPLASTY Right 08/11/2020  ? Procedure: TOTAL HIP ARTHROPLASTY ANTERIOR APPROACH;  Surgeon: Rod Can, MD;  [facility];  Service: Orthopedics;  Laterality: Right;  ? TUBAL LIGATION  ~ 1983  ? VAGINAL HYSTERECTOMY  ~ 1998  ? ? ?There were no vitals filed for this visit. ? ? Subjective Assessment - 11/19/21 1442   ? ? Subjective Patient states she had car trouble and then a bout of diverticulitis.  She is  doing well now.  She is walking at home with no assistive device.   ? Pertinent History cervical and lumbar spondylosis   ? How long can you walk comfortably? 20 min   ? Patient Stated Goals reduce stiffness, work on strength and balance   ? Currently in Pain? Yes   ? Pain Score 6    ? Pain Location Back   ? Pain Orientation Medial   ? Pain Descriptors / Indicators Aching   ? Pain Type Chronic pain   ? Pain Onset More than a month ago   ? ?  ?  ? ?  ? ? ? ? ? ? ? ? ? ? ? ? ? ? ? ? ? ? ? ? [place] Adult PT Treatment/Exercise - 11/19/21 0001   ? ?  ? Exercises  ? Exercises Shoulder;Lumbar;Knee/Hip;Elbow   ?  ? Elbow Exercises  ? Bar Weights/Barbell (Elbow Flexion) 2 lbs   ? Elbow Flexion Limitations 20 reps standing   ?  ? Lumbar Exercises: Aerobic  ? Nustep L3 x 5' PT present to discuss progress   ?  ? Lumbar Exercises: Seated  ? Other Seated Lumbar Exercises march on swiss ball x 20   ? Other Seated Lumbar Exercises Alternating arm and legs on swiss ball x 20   ?  ? Knee/Hip Exercises: Standing  ? Heel Raises 20 reps   ? Heel Raises Limitations no UE support   ? Hip Flexion Both;1 set;Knee straight;20 reps   ? Hip Flexion Limitations at counter   ? Hip Abduction Stengthening;1 set;Both;20 reps   ? Abduction Limitations at counter   ? Hip Extension Stengthening;Both;1 set;20 reps;Knee straight   ? Extension Limitations at counter   ? Forward Step Up Both;Hand Hold: 0;2 sets;10 reps;Step Height: 6"   ?  ? Knee/Hip Exercises: Seated  ? Long [employer] Strengthening;Both;2 sets;10 reps;Weights   ? Long Arc Quad Weight --   2.5 lbs  ? Marching Strengthening;Both;1 set;20 reps;Weights   ? Marching Weights --   2.5 lb  ? Hamstring Curl Strengthening;Both;2 sets;10 reps   ? Hamstring Limitations red loop band at ankles   ? Sit to Sand 2 sets;10 reps;without UE support   hold 5lb kbell  ? ?  ?  ? ?  ? ? ? ? ? ? ? ? ? ? ? ? PT Short Term Goals - 09/19/21 1436   ? ?  ? PT SHORT TERM GOAL #1  ? Title Pt will be safe and ind with HEP  for functional strength training and balance.   ? Status Achieved   ?  ? PT SHORT TERM GOAL #2  ? Title Pt will be able to particiapte in 6' walk test covering at least 500'  using SPC without LOB.   ? Time 4   ? Status Achieved   ?  ? PT SHORT TERM GOAL #3  ? Title Pt will achieve at least 4-/5 strength score for UEs.   ? Status Achieved   ?  ? PT SHORT TERM GOAL #4  ? Title Pt will improve 5x sit to stand to </= 14"   ? Status Achieved   ?  ? PT SHORT TERM GOAL #5  ? Title Pt will be able to perform SLS at counter with light single UE support x 30" on Rt and Lt LE   ? Status Achieved   ? ?  ?  ? ?  ? ? ? ? PT Long Term Goals - 10/09/21 1313   ? ?  ? PT LONG TERM GOAL #1  ? Title Pt will improve 5x sit<>stand to less than or equal to 12 sec for improved functional strength and demo of reduced fall risk.   ? Status Achieved   ?  ? PT LONG TERM GOAL #2  ? Title Pt will improve FGA to score of at least 20/30 to demo less fall risk.   ? Baseline 24/30   ? Status Achieved   ?  ? PT LONG TERM GOAL #3  ? Title Pt will demo safe step up and step down strategy from curb using SPC to reduce fall risk in the community.   ? Status Achieved   ?  ? PT LONG TERM GOAL #4  ? Title Pt will be able to demo forward and lateral hurdle step over and back and alternating march taps to high cones without LOB to demo improved dynamic balance and safety with stepping strategies and brief SLS tasks.   ? Status Achieved   ?  ? PT LONG TERM GOAL #5  ? Title Pt will be ind with complete HEP and verbalize understanding of importance to continue for maintained strength, balance and safety.   ? Status On-going   ?  ? PT LONG TERM GOAL #6  ? Title Pt will achieve at least 4/5 strength in bil UEs and 4+/5 strength in bil LEs for improved functional tasks such as stairs, transfers, reaching, carrying, and squatting.   ? Status On-going   ? ?  ?  ? ?  ? ? ? ? ? ? ? ? Plan - 11/19/21 1443   ? ? Clinical Impression Statement Patient is progressing  appropriately.  She would benefit from continuing skilled PT for balance training and hip strengthening to reduce fall risk.   ? Personal Factors and Comorbidities Comorbidity 1;Comorbidity 2;Time since onset

## 2021-11-19 NOTE — Telephone Encounter (Signed)
PC follow up call, no answer ?

## 2021-11-20 DIAGNOSIS — R2681 Unsteadiness on feet: Secondary | ICD-10-CM | POA: Diagnosis not present

## 2021-11-20 DIAGNOSIS — R6889 Other general symptoms and signs: Secondary | ICD-10-CM | POA: Diagnosis not present

## 2021-11-21 ENCOUNTER — Telehealth: Payer: Self-pay | Admitting: Physical Therapy

## 2021-11-21 ENCOUNTER — Encounter: Payer: Self-pay | Admitting: Physical Therapy

## 2021-11-21 NOTE — Telephone Encounter (Signed)
Pt was a no show for PT appointment on 11/21/21.  PT left VM asking Pt to return call.   ? ?Baruch Merl, PT ?11/21/21 2:18 PM ? ?

## 2021-11-22 ENCOUNTER — Other Ambulatory Visit: Payer: Medicare Other | Admitting: Family Medicine

## 2021-11-22 ENCOUNTER — Encounter: Payer: Self-pay | Admitting: Family Medicine

## 2021-11-22 VITALS — BP 130/72 | HR 85 | Resp 18 | Wt 140.0 lb

## 2021-11-22 DIAGNOSIS — R296 Repeated falls: Secondary | ICD-10-CM

## 2021-11-22 DIAGNOSIS — T23262D Burn of second degree of back of left hand, subsequent encounter: Secondary | ICD-10-CM | POA: Diagnosis not present

## 2021-11-22 DIAGNOSIS — Z515 Encounter for palliative care: Secondary | ICD-10-CM
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
## 2021-11-26 ENCOUNTER — Ambulatory Visit: Payer: Medicare Other | Attending: Family Medicine

## 2021-11-26 DIAGNOSIS — M6281 Muscle weakness (generalized): Secondary | ICD-10-CM | POA: Diagnosis not present

## 2021-11-26 DIAGNOSIS — R296 Repeated falls: Secondary | ICD-10-CM | POA: Insufficient documentation

## 2021-11-26 DIAGNOSIS — R2689 Other abnormalities of gait and mobility: Secondary | ICD-10-CM | POA: Insufficient documentation
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
## 2021-11-28 ENCOUNTER — Telehealth: Payer: Self-pay | Admitting: Physical Therapy

## 2021-11-28 ENCOUNTER — Ambulatory Visit: Payer: Self-pay | Admitting: Physical Therapy

## 2021-11-28 NOTE — Telephone Encounter (Signed)
PT left voice mail for Pt to return call.  She missed her re-evaluation appointment for physical therapy on 11/28/21 at 9:30am.  This was her last scheduled visit. ? ?Baruch Merl, PT ?11/28/21 9:45 AM ? ?

## 2021-12-04 DIAGNOSIS — R6889 Other general symptoms and signs: Secondary | ICD-10-CM | POA: Diagnosis not present

## 2021-12-04 DIAGNOSIS — M50322 Other cervical disc degeneration at C5-C6 level: Secondary | ICD-10-CM | POA: Diagnosis not present

## 2021-12-04 DIAGNOSIS — R2681 Unsteadiness on feet: Secondary | ICD-10-CM | POA: Diagnosis not present

## 2021-12-04 DIAGNOSIS — G950 Syringomyelia and syringobulbia: Secondary | ICD-10-CM | POA: Diagnosis not present

## 2021-12-04 DIAGNOSIS — M5134 Other intervertebral disc degeneration, thoracic region: Secondary | ICD-10-CM | POA: Diagnosis not present

## 2021-12-05 DIAGNOSIS — R7309 Other abnormal glucose: Secondary | ICD-10-CM | POA: Diagnosis not present

## 2021-12-05 DIAGNOSIS — D649 Anemia, unspecified: Secondary | ICD-10-CM | POA: Diagnosis not present

## 2021-12-05 DIAGNOSIS — I428 Other cardiomyopathies: Secondary | ICD-10-CM | POA: Diagnosis not present

## 2021-12-05 DIAGNOSIS — E538 Deficiency of other specified B group vitamins: Secondary | ICD-10-CM | POA: Diagnosis not present

## 2021-12-05 DIAGNOSIS — R609 Edema, unspecified: Secondary | ICD-10-CM | POA: Diagnosis not present

## 2021-12-05 DIAGNOSIS — R002 Palpitations: Secondary | ICD-10-CM | POA: Diagnosis not present

## 2021-12-05 DIAGNOSIS — Z0000 Encounter for general adult medical examination without abnormal findings: Secondary | ICD-10-CM | POA: Diagnosis not present

## 2021-12-05 DIAGNOSIS — Z23 Encounter for immunization: Secondary | ICD-10-CM | POA: Diagnosis not present

## 2021-12-05 DIAGNOSIS — Z1159 Encounter for screening for other viral diseases: Secondary | ICD-10-CM | POA: Diagnosis not present

## 2021-12-05 DIAGNOSIS — G43909 Migraine, unspecified, not intractable, without status migrainosus: Secondary | ICD-10-CM | POA: Diagnosis not present

## 2021-12-05 DIAGNOSIS — M810 Age-related osteoporosis without current pathological fracture: Secondary | ICD-10-CM | POA: Diagnosis not present

## 2021-12-05 DIAGNOSIS — R296 Repeated falls: Secondary | ICD-10-CM | POA: Diagnosis not present

## 2021-12-05 DIAGNOSIS — M130 Polyarthritis, unspecified: Secondary | ICD-10-CM | POA: Diagnosis not present

## 2021-12-05 DIAGNOSIS — E785 Hyperlipidemia, unspecified: Secondary | ICD-10-CM | POA: Diagnosis not present

## 2021-12-11 ENCOUNTER — Ambulatory Visit: Payer: Medicare Other | Admitting: Internal Medicine

## 2021-12-12 ENCOUNTER — Telehealth: Payer: Self-pay

## 2021-12-12 NOTE — Telephone Encounter (Signed)
NOTES SCANNED TO REFERRAL 

## 2021-12-15 NOTE — Progress Notes (Signed)
Cardiology Office Note:    Date:  12/16/2021   ID:  Dorothy White, DOB 11/07/1950, MRN [id number]  PCP:  Jonathon Jordan, MD   [place] Providers Cardiologist:  Lenna Sciara, MD Referring MD: Saintclair Halsted, FNP   Chief Complaint/Reason for Referral: Palpitations and dyspnead  ASSESSMENT:    1. Dyspnea, unspecified type   2. Mild CAD   3. Hyperlipidemia, unspecified hyperlipidemia type   4. Tobacco abuse   5. Bilateral carotid artery disease, unspecified type ([place])   6. Palpitations     PLAN:    In order of problems listed above: 1.  Dyspnea:  Will obtain monitor, echo and coronary CTA to evaluate further.  If negative, and given her smoking history I think she would require pulmonary evaluation.  I will see her back in 6 months or earlier if needed. 2.  Mild coronary artery disease: The patient has mild obstructive coronary artery disease in the cardiac catheterization 2017.  We will start aspirin, atorvastatin 40 mg, and control cardiovascular risk factors.  Follow-up in 6 months or earlier if needed. 3.  Hyperlipidemia: Start atorvastatin 40 mg and check lipid panel, LFTs, and LP(a) in 2 months. 4.  Tobacco abuse: Stressed need for abstinence 5.  Carotid disease: We will obtain carotid ultrasound to evaluate further.  Moderate disease was seen in 2018. 6.  Palpitations: We will obtain monitor.     Dispo:  Return in about 6 months (around 06/18/2022).      Medication Adjustments/Labs and Tests Ordered: Current medicines are reviewed at length with the patient today.  Concerns regarding medicines are outlined above.  The following changes have been made:     Labs/tests ordered: Orders Placed This Encounter  Procedures   CT CORONARY MORPH W/CTA COR W/SCORE W/CA W/CM &/OR WO/CM   Lipid Profile   Hepatic function panel   Lipoprotein A (LPA)   Basic Metabolic Panel (BMET)   LONG TERM MONITOR (3-14 DAYS)   EKG 12-Lead   ECHOCARDIOGRAM COMPLETE   VAS US  CAROTID    Medication Changes: Meds ordered this encounter  Medications   aspirin EC 81 MG tablet    Sig: Take 1 tablet (81 mg total) by mouth daily. Swallow whole.    Dispense:  90 tablet    Refill:  3   atorvastatin (LIPITOR) 40 MG tablet    Sig: Take 1 tablet (40 mg total) by mouth daily.    Dispense:  90 tablet    Refill:  3   metoprolol tartrate (LOPRESSOR) 100 MG tablet    Sig: Take one tablet by mouth 2 hours prior to CT Scan    Dispense:  180 tablet    Refill:  3     Current medicines are reviewed at length with the patient today.  The patient does not have concerns regarding medicines.   History of Present Illness:    FOCUSED PROBLEM LIST:   1.  Mild coronary artery disease cardiac catheterization 2017 2.  Hyperlipidemia 3.  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  Tobacco abuse  The patient is a 71 y.o. female with the indicated medical history here to establish general cardiovascular care.  She was seen in the emergency department in April due to nausea and vomiting.  Cardiac biomarkers and EKG were unremarkable.  The patient tells me that she has had increasing shortness of breath with exertion.  Sometimes when  she bends over she will feel very very fatigued.  She has noticed that her energy level is also quite poor.  She denies any exertional chest pain but on occasion she will get chest pain at rest.  She denies any paroxysmal nocturnal dyspnea, orthopnea, presyncope, or syncope.  She has palpitations she tells me almost every day.  These can last several hours.  She also has some peripheral edema that seems to respond to Lasix.  She does continue to smoke and has not seen a pulmonologist or had PFTs.  Current Medications: Current Meds  Medication Sig   Apoaequorin (PREVAGEN) 10 MG CAPS Take 1 capsule by mouth daily.   aspirin EC 81 MG tablet Take 1 tablet (81 mg total) by  mouth daily. Swallow whole.   atorvastatin (LIPITOR) 40 MG tablet Take 1 tablet (40 mg total) by mouth daily.   Cholecalciferol (VITAMIN D3) 50 MCG (2000 UT) capsule 1 capsule   docusate sodium (COLACE) 100 MG capsule Take 1 capsule (100 mg total) by mouth 2 (two) times daily. (Patient taking differently: Take 100 mg by mouth daily as needed for mild constipation.)   ferrous sulfate 325 (65 FE) MG tablet Take 325 mg by mouth every other day.   FLUoxetine (PROZAC) 40 MG capsule Take 80 mg by mouth daily.   furosemide (LASIX) 20 MG tablet Take 20 mg by mouth daily as needed.   gabapentin (NEURONTIN) 100 MG capsule Take 200 mg by mouth 3 (three) times daily.   ipratropium (ATROVENT) 0.06 % nasal spray Place 2 sprays into both nostrils 3 (three) times daily.   metoprolol tartrate (LOPRESSOR) 100 MG tablet Take one tablet by mouth 2 hours prior to CT Scan   mirtazapine (REMERON) 15 MG tablet TAKE 1 TABLET BY MOUTH EVERYDAY AT BEDTIME   omeprazole (PRILOSEC) 20 MG capsule Take 20 mg by mouth daily.   ondansetron (ZOFRAN) 4 MG tablet Take 4 mg by mouth 4 (four) times daily as needed.   oxyCODONE-acetaminophen (PERCOCET) 10-325 MG tablet Take 1 tablet by mouth every 4 (four) hours as needed for pain.   SUMAtriptan (IMITREX) 100 MG tablet Take 50-100 mg by mouth 2 (two) times daily as needed for migraine.   traZODone (DESYREL) 100 MG tablet Take 100 mg by mouth at bedtime.   vitamin B-12 (CYANOCOBALAMIN) 1000 MCG tablet 1 tablet     Allergies:    Opana [oxymorphone hcl] and Penicillins   Social History:   Social History   Tobacco Use   Smoking status: Some Days    Packs/day: 0.25    Years: 34.00    Pack years: 8.50    Types: Cigarettes   Smokeless tobacco: Never  Vaping Use   Vaping Use: Never used  Substance Use Topics   Alcohol use: Not Currently    Alcohol/week: 1.0 standard drink    Types: 1 Standard drinks or equivalent per week    Comment: occ    Drug use: No    Types: Cocaine     Comment: Denies any drug use 02/19/18     Family Hx: Family History  Problem Relation Age of Onset   Lung cancer Mother 31   Migraines Mother    Heart attack Father        Vague   Hypertension Brother    Esophageal cancer Brother    Diabetes Paternal Grandmother        questionable   Colon cancer Neg Hx    Kidney disease Neg Hx  Liver disease Neg Hx      Review of Systems:   Please see the history of present illness.    All other systems reviewed and are negative.     EKGs/Labs/Other Test Reviewed:    EKG:  EKG performed April 2023 that I personally reviewed demonstrates sinus rhythm with nonspecific ST and T wave changes; EKG performed today that I personally reviewed demonstrates sinus rhythm with nonspecific ST and T wave changes.  Prior CV studies:  TTE 2023 with ejection fraction of 60 to 65% with no significant valvular abnormalities  Carotid ultrasound 2018 with mild stenosis of the right internal carotid and moderate stenosis of left internal carotid  Coronary angiography 2017 with mild mid LAD disease  Other studies Reviewed: Review of the additional studies/records demonstrates: CT chest 2018 without aortic atherosclerosis of coronary artery calcification  Recent Labs: 01/02/2021: Magnesium 2.2 05/31/2021: B Natriuretic Peptide 166.5 11/05/2021: ALT 10; BUN 17; Creatinine, Ser 0.68; Hemoglobin 11.3; Platelets 337; Potassium 3.9; Sodium 142   Recent Lipid Panel Lab Results  Component Value Date/Time   CHOL 178 02/18/2017 07:54 PM   CHOL 243 (H) 08/15/2013 08:39 AM   TRIG 139 02/18/2017 07:54 PM   HDL 48 02/18/2017 07:54 PM   HDL 55 08/15/2013 08:39 AM   LDLCALC 102 (H) 02/18/2017 07:54 PM   LDLCALC 142 (H) 08/15/2013 08:39 AM    Risk Assessment/Calculations:           Physical Exam:    VS:  BP 116/60   Pulse 76   Ht '5\' 5"'$  (1.651 m)   Wt 143 lb 9.6 oz (65.1 kg)   SpO2 94%   BMI 23.90 kg/m    Wt Readings from Last 3 Encounters:  12/16/21  143 lb 9.6 oz (65.1 kg)  11/22/21 140 lb (63.5 kg)  05/31/21 94 lb 12.8 oz (43 kg)    GENERAL:  No apparent distress, AOx3 HEENT:  No carotid bruits, +2 carotid impulses, no scleral icterus CAR: RRR no murmurs, gallops, rubs, or thrills RES:  Clear to auscultation bilaterally ABD:  Soft, nontender, nondistended, positive bowel sounds x 4 VASC:  +2 radial pulses, +2 carotid pulses, palpable pedal pulses NEURO:  CN 2-12 grossly intact; motor and sensory grossly intact PSYCH:  No active depression or anxiety EXT:  No edema, ecchymosis, or cyanosis  Signed, Early Osmond, MD  12/16/2021 [address], [place], [place]  [address] Phone: [phone]; Fax: [phone]   Note:  This document was prepared using Dragon voice recognition software and may include unintentional dictation errors.

## 2021-12-16 ENCOUNTER — Ambulatory Visit (INDEPENDENT_AMBULATORY_CARE_PROVIDER_SITE_OTHER): Payer: Medicare Other

## 2021-12-16 ENCOUNTER — Ambulatory Visit (INDEPENDENT_AMBULATORY_CARE_PROVIDER_SITE_OTHER): Payer: Medicare Other | Admitting: Internal Medicine

## 2021-12-16 ENCOUNTER — Encounter: Payer: Self-pay | Admitting: Internal Medicine

## 2021-12-16 ENCOUNTER — Other Ambulatory Visit: Payer: Self-pay | Admitting: Internal Medicine

## 2021-12-16 VITALS — BP 116/60 | HR 76 | Ht 65.0 in | Wt 143.6 lb

## 2021-12-16 DIAGNOSIS — R002 Palpitations: Secondary | ICD-10-CM

## 2021-12-16 DIAGNOSIS — I779 Disorder of arteries and arterioles, unspecified: Secondary | ICD-10-CM | POA: Diagnosis not present

## 2021-12-16 DIAGNOSIS — E785 Hyperlipidemia, unspecified: Secondary | ICD-10-CM

## 2021-12-16 DIAGNOSIS — Z720 Tobacco use: Secondary | ICD-10-CM

## 2021-12-16 DIAGNOSIS — R0600 Dyspnea, unspecified: Secondary | ICD-10-CM

## 2021-12-16 DIAGNOSIS — I2510 Atherosclerotic heart disease of native coronary artery without angina pectoris: Secondary | ICD-10-CM | POA: Diagnosis not present

## 2021-12-16 MED ORDER — METOPROLOL TARTRATE 100 MG PO TABS: ORAL_TABLET | ORAL | 3 refills | Status: DC

## 2021-12-16 MED ORDER — ATORVASTATIN CALCIUM 40 MG PO TABS: 40.0000 mg | ORAL_TABLET | Freq: Every day | ORAL | 3 refills | Status: DC

## 2021-12-16 MED ORDER — ASPIRIN 81 MG PO TBEC: 81.0000 mg | DELAYED_RELEASE_TABLET | Freq: Every day | ORAL | 3 refills | Status: AC

## 2021-12-16 NOTE — Progress Notes (Unsigned)
Enrolled for [employer] to mail a ZIO XT long term holter monitor to the patients address on file.  

## 2021-12-16 NOTE — Patient Instructions (Addendum)
Medication Instructions:  Your physician has recommended you make the following change in your medication: Start aspirin 81 mg by mouth daily. Start Atorvastatin 40 mg by mouth daily  *If you need a refill on your cardiac medications before your next appointment, please call your pharmacy*   Lab Work: Lab work to be done today--BMP  Your physician recommends that you return for lab work in: 2 months.  Lipid, liver, Lipoprotein A.  This will be fasting  If you have labs (blood work) drawn today and your tests are completely normal, you will receive your results only by: [place] (if you have MyChart) OR A paper copy in the mail If you have any lab test that is abnormal or we need to change your treatment, we will call you to review the results.   Testing/Procedures: Your physician has requested that you have a carotid duplex. This test is an ultrasound of the carotid arteries in your neck. It looks at blood flow through these arteries that supply the brain with blood. Allow one hour for this exam. There are no restrictions or special instructions.   Your physician has requested that you have an echocardiogram. Echocardiography is a painless test that uses sound waves to create images of your heart. It provides your doctor with information about the size and shape of your heart and how well your heart's chambers and valves are working. This procedure takes approximately one hour. There are no restrictions for this procedure.  Your physician has requested that you have cardiac CT. Cardiac computed tomography (CT) is a painless test that uses an x-ray machine to take clear, detailed pictures of your heart. For further information please visit [email or web address]. Please follow instruction sheet as given.  Dr [name] recommends you wear a monitor for 3 days   Follow-Up: At [facility], you and your health needs are our priority.  As part of our continuing mission to provide you  with exceptional heart care, we have created designated Provider Care Teams.  These Care Teams include your primary Cardiologist (physician) and Advanced Practice Providers (APPs -  Physician Assistants and Nurse Practitioners) who all work together to provide you with the care you need, when you need it.  We recommend signing up for the patient portal called "MyChart".  Sign up information is provided on this After Visit Summary.  MyChart is used to connect with patients for Virtual Visits (Telemedicine).  Patients are able to view lab/test results, encounter notes, upcoming appointments, etc.  Non-urgent messages can be sent to your provider as well.   To learn more about what you can do with MyChart, go to [email or web address].    Your next appointment:   6 month(s)  The format for your next appointment:   In Person  Provider:   Early Osmond, MD     Other Instructions   Your cardiac CT will be scheduled at one of the below locations:   [facility] [address] [place], [place] [address] [phone]  [place] [address] [place], [place] [address] [phone]  If scheduled at [facility], please arrive at the [facility] and Children's Entrance (Entrance C2) of [facility] 30 minutes prior to test start time. You can use the FREE valet parking offered at entrance C (encouraged to control the heart rate for the test)  Proceed to the [facility] Radiology Department (first floor) to check-in and test prep.  All  radiology patients and guests should use entrance C2 at [facility], accessed from [facility], even though the hospital's physical address listed is [address].    If scheduled at [facility], please arrive 15 mins early for check-in and test prep.  Please follow these instructions carefully (unless otherwise  directed):    On the Night Before the Test: Be sure to Drink plenty of water. Do not consume any caffeinated/decaffeinated beverages or chocolate 12 hours prior to your test. Do not take any antihistamines 12 hours prior to your test.   On the Day of the Test: Drink plenty of water until 1 hour prior to the test. Do not eat any food 4 hours prior to the test. You may take your regular medications prior to the test.  Take metoprolol (Lopressor) two hours prior to test. HOLD Furosemide/Hydrochlorothiazide morning of the test. FEMALES- please wear underwire-free bra if available, avoid dresses & tight clothing       After the Test: Drink plenty of water. After receiving IV contrast, you may experience a mild flushed feeling. This is normal. On occasion, you may experience a mild rash up to 24 hours after the test. This is not dangerous. If this occurs, you can take Benadryl 25 mg and increase your fluid intake. If you experience trouble breathing, this can be serious. If it is severe call 911 IMMEDIATELY. If it is mild, please call our office. If you take any of these medications: Glipizide/Metformin, Avandament, Glucavance, please do not take 48 hours after completing test unless otherwise instructed.  We will call to schedule your test 2-4 weeks out understanding that some insurance companies will need an authorization prior to the service being performed.   For non-scheduling related questions, please contact the cardiac imaging nurse navigator should you have any questions/concerns: Marchia Bond, Cardiac Imaging Nurse Navigator Gordy Clement, Cardiac Imaging Nurse Navigator [facility] Direct Office Dial: [phone]   For scheduling needs, including cancellations and rescheduling, please call [place], [phone].  ZIO XT- Long Term Monitor Instructions  Your physician has requested you wear a ZIO patch monitor for 3 days.  This is a single  patch monitor. [employer] supplies one patch monitor per enrollment. Additional stickers are not available. Please do not apply patch if you will be having a Nuclear Stress Test,  Echocardiogram, Cardiac CT, MRI, or Chest Xray during the period you would be wearing the  monitor. The patch cannot be worn during these tests. You cannot remove and re-apply the  ZIO XT patch monitor.  Your ZIO patch monitor will be mailed 3 day USPS to your address on file. It may take 3-5 days  to receive your monitor after you have been enrolled.  Once you have received your monitor, please review the enclosed instructions. Your monitor  has already been registered assigning a specific monitor serial # to you.  Billing and Patient Assistance Program Information  We have supplied [employer] with any of your insurance information on file for billing purposes. [employer] offers a sliding scale Patient Assistance Program for patients that do not have  insurance, or whose insurance does not completely cover the cost of the ZIO monitor.  You must apply for the Patient Assistance Program to qualify for this discounted rate.  To apply, please call [employer] at [phone], select option 4, select option 2, ask to apply for  Patient Assistance Program. Theodore Demark will ask your household income, and how many  people  are in your household. They will quote your out-of-pocket cost based on that information.  [employer] will also be able to set up a 38-month interest-free payment plan if needed.  Applying the monitor   Shave hair from upper left chest.  Hold abrader disc by orange tab. Rub abrader in 40 strokes over the upper left chest as  indicated in your monitor instructions.  Clean area with 4 enclosed alcohol pads. Let dry.  Apply patch as indicated in monitor instructions. Patch will be placed under collarbone on left  side of chest with arrow pointing upward.  Rub patch adhesive wings for 2 minutes. Remove white label marked  "1". Remove the white  label marked "2". Rub patch adhesive wings for 2 additional minutes.  While looking in a mirror, press and release button in center of patch. A small green light will  flash 3-4 times. This will be your only indicator that the monitor has been turned on.  Do not shower for the first 24 hours. You may shower after the first 24 hours.  Press the button if you feel a symptom. You will hear a small click. Record Date, Time and  Symptom in the Patient Logbook.  When you are ready to remove the patch, follow instructions on the last 2 pages of Patient  Logbook. Stick patch monitor onto the last page of Patient Logbook.  Place Patient Logbook in the blue and white box. Use locking tab on box and tape box closed  securely. The blue and white box has prepaid postage on it. Please place it in the mailbox as  soon as possible. Your physician should have your test results approximately 7 days after the  monitor [facility]  Call [facility] [phone] you have questions regarding  your ZIO XT patch monitor. Call them immediately if you see an orange light blinking on your  monitor.  If your monitor falls off in less than 4 days, contact our Monitor department at [phone]  If your monitor becomes loose or falls off after 4 days call [employer] at [phone]  suggestions on securing your monitor   Important Information About Sugar

## 2021-12-17 ENCOUNTER — Telehealth: Payer: Self-pay

## 2021-12-17 LAB — BASIC METABOLIC PANEL
BUN/Creatinine Ratio: 18 (ref 12–28)
BUN: 15 mg/dL (ref 8–27)
CO2: 24 mmol/L (ref 20–29)
Calcium: 9.3 mg/dL (ref 8.7–10.3)
Chloride: 104 mmol/L (ref 96–106)
Creatinine, Ser: 0.82 mg/dL (ref 0.57–1.00)
Glucose: 86 mg/dL (ref 70–99)
Potassium: 4.9 mmol/L (ref 3.5–5.2)
Sodium: 141 mmol/L (ref 134–144)
eGFR: 77 mL/min/{1.73_m2} (ref >59)

## 2021-12-17 NOTE — Addendum Note (Signed)
Addended by: Aris Georgia, Xavi Tomasik L on: 12/17/2021 05:13 PM   Modules accepted: Orders

## 2021-12-17 NOTE — Telephone Encounter (Signed)
Called [facility] and informed them that patient is only suppose to take Metoprolol 100 mg by mouth 2 hours prior to CT. They are to give one tablet with zero refills.

## 2021-12-17 NOTE — Telephone Encounter (Signed)
[facility] pharmacy calling stating that pt's medication metoprolol tartrate 100 mg tablets were sent into the pharmacy with directions stating for pt to take before a CT scan, but 180 tablets were sent in. Please clarify if pt is suppose to just take this medication for a CT scan or if pt is suppose to take it daily? Please address

## 2021-12-19 ENCOUNTER — Other Ambulatory Visit: Payer: Medicare Other | Admitting: Family Medicine

## 2021-12-19 DIAGNOSIS — R002 Palpitations: Secondary | ICD-10-CM

## 2021-12-24 ENCOUNTER — Ambulatory Visit (HOSPITAL_COMMUNITY)
Admission: RE | Admit: 2021-12-24 | Discharge: 2021-12-24 | Disposition: A | Discharge status: Home / Self Care | Payer: Medicare Other | Source: Ambulatory Visit | Attending: Internal Medicine | Admitting: Internal Medicine

## 2021-12-24 DIAGNOSIS — R42 Dizziness and giddiness: Secondary | ICD-10-CM

## 2021-12-24 DIAGNOSIS — I779 Disorder of arteries and arterioles, unspecified: Secondary | ICD-10-CM | POA: Insufficient documentation

## 2021-12-26 ENCOUNTER — Telehealth: Payer: Self-pay

## 2021-12-26 NOTE — Telephone Encounter (Signed)
Attempted to contact patient to reschedule follow up appointment. No answer left a message to return call.

## 2021-12-30 ENCOUNTER — Telehealth: Payer: Self-pay

## 2021-12-30 NOTE — Telephone Encounter (Signed)
Volunteer check in call made for palliative care, no answer 

## 2021-12-31 ENCOUNTER — Other Ambulatory Visit: Payer: Self-pay | Admitting: Neurology

## 2021-12-31 ENCOUNTER — Ambulatory Visit (HOSPITAL_COMMUNITY): Payer: Medicare Other | Attending: Internal Medicine

## 2021-12-31 DIAGNOSIS — R002 Palpitations: Secondary | ICD-10-CM | POA: Diagnosis not present

## 2021-12-31 DIAGNOSIS — R0600 Dyspnea, unspecified: Secondary | ICD-10-CM | POA: Diagnosis not present

## 2021-12-31 DIAGNOSIS — R270 Ataxia, unspecified: Secondary | ICD-10-CM

## 2021-12-31 DIAGNOSIS — F4321 Adjustment disorder with depressed mood: Secondary | ICD-10-CM

## 2021-12-31 DIAGNOSIS — G238 Other specified degenerative diseases of basal ganglia: Secondary | ICD-10-CM

## 2021-12-31 DIAGNOSIS — R292 Abnormal reflex: Secondary | ICD-10-CM

## 2021-12-31 LAB — ECHOCARDIOGRAM COMPLETE
AR max vel: 2.87 cm2
AV Peak grad: 5.2 mmHg
Ao pk vel: 1.14 m/s
Area-P 1/2: 3.95 cm2
S' Lateral: 3.4 cm

## 2022-01-04 ENCOUNTER — Encounter: Payer: Self-pay | Admitting: Internal Medicine

## 2022-01-06 MED ORDER — DILTIAZEM HCL ER COATED BEADS 120 MG PO CP24: 120.0000 mg | ORAL_CAPSULE | Freq: Every day | ORAL | 3 refills | Status: DC

## 2022-01-07 MED ORDER — ATORVASTATIN CALCIUM 40 MG PO TABS: 40.0000 mg | ORAL_TABLET | Freq: Every day | ORAL | 3 refills | Status: DC

## 2022-01-07 NOTE — Addendum Note (Signed)
Addended by: Rodman Key on: 01/07/2022 09:58 AM   Modules accepted: Orders

## 2022-01-14 ENCOUNTER — Telehealth (HOSPITAL_COMMUNITY): Payer: Self-pay | Admitting: *Deleted

## 2022-01-14 NOTE — Telephone Encounter (Signed)
Attempted to call patient regarding upcoming cardiac CT appointment. °Left message on voicemail with name and callback number ° °Jjesus Dingley RN Navigator Cardiac Imaging °[facility] °[phone] Office °[phone] Cell ° °

## 2022-01-15 ENCOUNTER — Ambulatory Visit (HOSPITAL_COMMUNITY): Admission: RE | Admit: 2022-01-15 | Payer: Medicare Other | Source: Ambulatory Visit

## 2022-01-16 ENCOUNTER — Telehealth: Payer: Self-pay

## 2022-01-16 NOTE — Telephone Encounter (Signed)
Left message for patient to call Gordy Clement RN Navigator Cardiac Imaging at [phone] to reschedule her CTA.

## 2022-01-16 NOTE — Telephone Encounter (Signed)
-----   Message from Early Osmond, MD sent at 01/16/2022  7:23 AM EDT ----- Patient did not show for her coronary CTA, please contact patient to reschedule

## 2022-01-18 ENCOUNTER — Emergency Department (HOSPITAL_COMMUNITY): Payer: Medicare Other

## 2022-01-18 ENCOUNTER — Encounter (HOSPITAL_COMMUNITY): Payer: Self-pay | Admitting: *Deleted

## 2022-01-18 ENCOUNTER — Emergency Department (HOSPITAL_COMMUNITY)
Admission: EM | Admit: 2022-01-18 | Discharge: 2022-01-18 | Disposition: A | Discharge status: Home / Self Care | Payer: Medicare Other | Source: Home / Self Care | Attending: Emergency Medicine | Admitting: Emergency Medicine

## 2022-01-18 ENCOUNTER — Other Ambulatory Visit: Payer: Self-pay

## 2022-01-18 DIAGNOSIS — K219 Gastro-esophageal reflux disease without esophagitis: Secondary | ICD-10-CM | POA: Diagnosis not present

## 2022-01-18 DIAGNOSIS — S61451A Open bite of right hand, initial encounter: Secondary | ICD-10-CM | POA: Insufficient documentation

## 2022-01-18 DIAGNOSIS — Z9049 Acquired absence of other specified parts of digestive tract: Secondary | ICD-10-CM | POA: Diagnosis not present

## 2022-01-18 DIAGNOSIS — E785 Hyperlipidemia, unspecified: Secondary | ICD-10-CM | POA: Diagnosis not present

## 2022-01-18 DIAGNOSIS — F17210 Nicotine dependence, cigarettes, uncomplicated: Secondary | ICD-10-CM | POA: Diagnosis not present

## 2022-01-18 DIAGNOSIS — M1990 Unspecified osteoarthritis, unspecified site: Secondary | ICD-10-CM | POA: Diagnosis not present

## 2022-01-18 DIAGNOSIS — I2510 Atherosclerotic heart disease of native coronary artery without angina pectoris: Secondary | ICD-10-CM | POA: Diagnosis not present

## 2022-01-18 DIAGNOSIS — W540XXA Bitten by dog, initial encounter: Secondary | ICD-10-CM | POA: Insufficient documentation

## 2022-01-18 DIAGNOSIS — Z79899 Other long term (current) drug therapy: Secondary | ICD-10-CM | POA: Insufficient documentation

## 2022-01-18 DIAGNOSIS — Z7982 Long term (current) use of aspirin: Secondary | ICD-10-CM | POA: Insufficient documentation

## 2022-01-18 DIAGNOSIS — Z885 Allergy status to narcotic agent status: Secondary | ICD-10-CM | POA: Diagnosis not present

## 2022-01-18 DIAGNOSIS — Z96643 Presence of artificial hip joint, bilateral: Secondary | ICD-10-CM | POA: Diagnosis not present

## 2022-01-18 DIAGNOSIS — Z79891 Long term (current) use of opiate analgesic: Secondary | ICD-10-CM | POA: Diagnosis not present

## 2022-01-18 DIAGNOSIS — I779 Disorder of arteries and arterioles, unspecified: Secondary | ICD-10-CM | POA: Diagnosis not present

## 2022-01-18 DIAGNOSIS — D509 Iron deficiency anemia, unspecified: Secondary | ICD-10-CM | POA: Diagnosis not present

## 2022-01-18 DIAGNOSIS — Z23 Encounter for immunization: Secondary | ICD-10-CM | POA: Insufficient documentation

## 2022-01-18 DIAGNOSIS — Z8673 Personal history of transient ischemic attack (TIA), and cerebral infarction without residual deficits: Secondary | ICD-10-CM | POA: Diagnosis not present

## 2022-01-18 DIAGNOSIS — F32A Depression, unspecified: Secondary | ICD-10-CM | POA: Diagnosis not present

## 2022-01-18 DIAGNOSIS — E876 Hypokalemia: Secondary | ICD-10-CM | POA: Diagnosis not present

## 2022-01-18 DIAGNOSIS — I428 Other cardiomyopathies: Secondary | ICD-10-CM | POA: Diagnosis not present

## 2022-01-18 DIAGNOSIS — Z85828 Personal history of other malignant neoplasm of skin: Secondary | ICD-10-CM | POA: Diagnosis not present

## 2022-01-18 DIAGNOSIS — F419 Anxiety disorder, unspecified: Secondary | ICD-10-CM | POA: Diagnosis not present

## 2022-01-18 DIAGNOSIS — L089 Local infection of the skin and subcutaneous tissue, unspecified: Secondary | ICD-10-CM

## 2022-01-18 DIAGNOSIS — I10 Essential (primary) hypertension: Secondary | ICD-10-CM | POA: Diagnosis not present

## 2022-01-18 DIAGNOSIS — L03113 Cellulitis of right upper limb: Secondary | ICD-10-CM | POA: Diagnosis not present

## 2022-01-18 DIAGNOSIS — Z880 Allergy status to penicillin: Secondary | ICD-10-CM | POA: Diagnosis not present

## 2022-01-18 LAB — CBC WITH DIFFERENTIAL/PLATELET
Abs Immature Granulocytes: 0.03 10*3/uL (ref 0.00–0.07)
Basophils Absolute: 0 10*3/uL (ref 0.0–0.1)
Basophils Relative: 0 %
Eosinophils Absolute: 0 10*3/uL (ref 0.0–0.5)
Eosinophils Relative: 0 %
HCT: 41.2 % (ref 36.0–46.0)
Hemoglobin: 11.7 g/dL — ABNORMAL LOW (ref 12.0–15.0)
Immature Granulocytes: 0 %
Lymphocytes Relative: 11 %
Lymphs Abs: 1.1 10*3/uL (ref 0.7–4.0)
MCH: 21.7 pg — ABNORMAL LOW (ref 26.0–34.0)
MCHC: 28.4 g/dL — ABNORMAL LOW (ref 30.0–36.0)
MCV: 76.6 fL — ABNORMAL LOW (ref 80.0–100.0)
Monocytes Absolute: 0.7 10*3/uL (ref 0.1–1.0)
Monocytes Relative: 7 %
Neutro Abs: 8.2 10*3/uL — ABNORMAL HIGH (ref 1.7–7.7)
Neutrophils Relative %: 82 %
Platelets: 284 10*3/uL (ref 150–400)
RBC: 5.38 MIL/uL — ABNORMAL HIGH (ref 3.87–5.11)
RDW: 20.3 % — ABNORMAL HIGH (ref 11.5–15.5)
WBC: 10.1 10*3/uL (ref 4.0–10.5)
nRBC: 0 % (ref 0.0–0.2)

## 2022-01-18 LAB — BASIC METABOLIC PANEL
Anion gap: 8 (ref 5–15)
BUN: 14 mg/dL (ref 8–23)
CO2: 26 mmol/L (ref 22–32)
Calcium: 9.4 mg/dL (ref 8.9–10.3)
Chloride: 110 mmol/L (ref 98–111)
Creatinine, Ser: 0.76 mg/dL (ref 0.44–1.00)
GFR, Estimated: >60 mL/min (ref >60)
Glucose, Bld: 103 mg/dL — ABNORMAL HIGH (ref 70–99)
Potassium: 4.5 mmol/L (ref 3.5–5.1)
Sodium: 144 mmol/L (ref 135–145)

## 2022-01-18 MED ORDER — CLINDAMYCIN HCL 150 MG PO CAPS: 300.0000 mg | ORAL_CAPSULE | Freq: Four times a day (QID) | ORAL | 0 refills | Status: DC

## 2022-01-18 MED ORDER — TETANUS-DIPHTHERIA TOXOIDS TD 5-2 LFU IM INJ
0.5000 mL | INJECTION | Freq: Once | INTRAMUSCULAR | Status: AC
  Administered 2022-01-18: 0.5 mL via INTRAMUSCULAR
  Filled 2022-01-18: qty 0.5

## 2022-01-18 MED ORDER — ACETAMINOPHEN 325 MG PO TABS
650.0000 mg | ORAL_TABLET | Freq: Once | ORAL | Status: AC
  Administered 2022-01-18: 650 mg via ORAL
  Filled 2022-01-18: qty 2

## 2022-01-18 MED ORDER — DOXYCYCLINE HYCLATE 100 MG PO TABS
100.0000 mg | ORAL_TABLET | Freq: Once | ORAL | Status: AC
  Administered 2022-01-18: 100 mg via ORAL
  Filled 2022-01-18: qty 1

## 2022-01-18 MED ORDER — CLINDAMYCIN HCL 300 MG PO CAPS
300.0000 mg | ORAL_CAPSULE | Freq: Once | ORAL | Status: AC
  Administered 2022-01-18: 300 mg via ORAL
  Filled 2022-01-18: qty 1

## 2022-01-18 MED ORDER — TETANUS-DIPHTHERIA TOXOIDS TD 5-2 LFU IM INJ
0.5000 mL | INJECTION | Freq: Once | INTRAMUSCULAR | Status: DC
  Administered 2022-01-18: 0.5 mL via INTRAMUSCULAR

## 2022-01-18 MED ORDER — DOXYCYCLINE HYCLATE 100 MG PO CAPS: 100.0000 mg | ORAL_CAPSULE | Freq: Two times a day (BID) | ORAL | 0 refills | Status: DC

## 2022-01-20 ENCOUNTER — Inpatient Hospital Stay (HOSPITAL_COMMUNITY)
Admission: EM | Admit: 2022-01-20 | Discharge: 2022-01-23 | DRG: 605 | Disposition: A | Discharge status: Home / Self Care | Payer: Medicare Other | Attending: Internal Medicine | Admitting: Internal Medicine

## 2022-01-20 ENCOUNTER — Other Ambulatory Visit: Payer: Self-pay

## 2022-01-20 ENCOUNTER — Encounter (HOSPITAL_COMMUNITY): Payer: Self-pay

## 2022-01-20 DIAGNOSIS — Z9049 Acquired absence of other specified parts of digestive tract: Secondary | ICD-10-CM

## 2022-01-20 DIAGNOSIS — Z79891 Long term (current) use of opiate analgesic: Secondary | ICD-10-CM

## 2022-01-20 DIAGNOSIS — E876 Hypokalemia: Secondary | ICD-10-CM | POA: Diagnosis present

## 2022-01-20 DIAGNOSIS — Z8249 Family history of ischemic heart disease and other diseases of the circulatory system: Secondary | ICD-10-CM

## 2022-01-20 DIAGNOSIS — E785 Hyperlipidemia, unspecified: Secondary | ICD-10-CM | POA: Diagnosis present

## 2022-01-20 DIAGNOSIS — I2510 Atherosclerotic heart disease of native coronary artery without angina pectoris: Secondary | ICD-10-CM | POA: Diagnosis present

## 2022-01-20 DIAGNOSIS — Z90722 Acquired absence of ovaries, bilateral: Secondary | ICD-10-CM

## 2022-01-20 DIAGNOSIS — I10 Essential (primary) hypertension: Secondary | ICD-10-CM | POA: Diagnosis present

## 2022-01-20 DIAGNOSIS — Z885 Allergy status to narcotic agent status: Secondary | ICD-10-CM

## 2022-01-20 DIAGNOSIS — M1990 Unspecified osteoarthritis, unspecified site: Secondary | ICD-10-CM | POA: Diagnosis present

## 2022-01-20 DIAGNOSIS — Z7982 Long term (current) use of aspirin: Secondary | ICD-10-CM

## 2022-01-20 DIAGNOSIS — S61451A Open bite of right hand, initial encounter: Principal | ICD-10-CM | POA: Diagnosis present

## 2022-01-20 DIAGNOSIS — Z8673 Personal history of transient ischemic attack (TIA), and cerebral infarction without residual deficits: Secondary | ICD-10-CM

## 2022-01-20 DIAGNOSIS — Z85828 Personal history of other malignant neoplasm of skin: Secondary | ICD-10-CM

## 2022-01-20 DIAGNOSIS — Z880 Allergy status to penicillin: Secondary | ICD-10-CM

## 2022-01-20 DIAGNOSIS — F32A Depression, unspecified: Secondary | ICD-10-CM | POA: Diagnosis present

## 2022-01-20 DIAGNOSIS — D509 Iron deficiency anemia, unspecified: Secondary | ICD-10-CM

## 2022-01-20 DIAGNOSIS — Z79899 Other long term (current) drug therapy: Secondary | ICD-10-CM

## 2022-01-20 DIAGNOSIS — Z720 Tobacco use: Secondary | ICD-10-CM

## 2022-01-20 DIAGNOSIS — I428 Other cardiomyopathies: Secondary | ICD-10-CM | POA: Diagnosis present

## 2022-01-20 DIAGNOSIS — Z90710 Acquired absence of both cervix and uterus: Secondary | ICD-10-CM

## 2022-01-20 DIAGNOSIS — I779 Disorder of arteries and arterioles, unspecified: Secondary | ICD-10-CM

## 2022-01-20 DIAGNOSIS — L03113 Cellulitis of right upper limb: Secondary | ICD-10-CM

## 2022-01-20 DIAGNOSIS — F419 Anxiety disorder, unspecified: Secondary | ICD-10-CM

## 2022-01-20 DIAGNOSIS — Z23 Encounter for immunization: Secondary | ICD-10-CM

## 2022-01-20 DIAGNOSIS — K219 Gastro-esophageal reflux disease without esophagitis: Secondary | ICD-10-CM | POA: Diagnosis present

## 2022-01-20 DIAGNOSIS — L0390 Cellulitis, unspecified: Secondary | ICD-10-CM | POA: Diagnosis present

## 2022-01-20 DIAGNOSIS — W540XXA Bitten by dog, initial encounter: Secondary | ICD-10-CM

## 2022-01-20 DIAGNOSIS — Z96643 Presence of artificial hip joint, bilateral: Secondary | ICD-10-CM | POA: Diagnosis present

## 2022-01-20 DIAGNOSIS — F17210 Nicotine dependence, cigarettes, uncomplicated: Secondary | ICD-10-CM | POA: Diagnosis present

## 2022-01-20 LAB — CBC WITH DIFFERENTIAL/PLATELET
Abs Immature Granulocytes: 0.02 10*3/uL (ref 0.00–0.07)
Basophils Absolute: 0 10*3/uL (ref 0.0–0.1)
Basophils Relative: 0 %
Eosinophils Absolute: 0.1 10*3/uL (ref 0.0–0.5)
Eosinophils Relative: 1 %
HCT: 38.9 % (ref 36.0–46.0)
Hemoglobin: 11.6 g/dL — ABNORMAL LOW (ref 12.0–15.0)
Immature Granulocytes: 0 %
Lymphocytes Relative: 14 %
Lymphs Abs: 1.2 10*3/uL (ref 0.7–4.0)
MCH: 21.8 pg — ABNORMAL LOW (ref 26.0–34.0)
MCHC: 29.8 g/dL — ABNORMAL LOW (ref 30.0–36.0)
MCV: 73.3 fL — ABNORMAL LOW (ref 80.0–100.0)
Monocytes Absolute: 0.5 10*3/uL (ref 0.1–1.0)
Monocytes Relative: 6 %
Neutro Abs: 6.9 10*3/uL (ref 1.7–7.7)
Neutrophils Relative %: 79 %
Platelets: 288 10*3/uL (ref 150–400)
RBC: 5.31 MIL/uL — ABNORMAL HIGH (ref 3.87–5.11)
RDW: 20.2 % — ABNORMAL HIGH (ref 11.5–15.5)
WBC: 8.7 10*3/uL (ref 4.0–10.5)
nRBC: 0 % (ref 0.0–0.2)

## 2022-01-20 LAB — BASIC METABOLIC PANEL
Anion gap: 11 (ref 5–15)
BUN: 10 mg/dL (ref 8–23)
CO2: 21 mmol/L — ABNORMAL LOW (ref 22–32)
Calcium: 9.2 mg/dL (ref 8.9–10.3)
Chloride: 111 mmol/L (ref 98–111)
Creatinine, Ser: 0.75 mg/dL (ref 0.44–1.00)
GFR, Estimated: >60 mL/min (ref >60)
Glucose, Bld: 116 mg/dL — ABNORMAL HIGH (ref 70–99)
Potassium: 3.4 mmol/L — ABNORMAL LOW (ref 3.5–5.1)
Sodium: 143 mmol/L (ref 135–145)

## 2022-01-20 LAB — MAGNESIUM: Magnesium: 2 mg/dL (ref 1.7–2.4)

## 2022-01-20 MED ORDER — ENOXAPARIN SODIUM 40 MG/0.4ML IJ SOSY
40.0000 mg | PREFILLED_SYRINGE | INTRAMUSCULAR | Status: DC
  Administered 2022-01-22: 40 mg via SUBCUTANEOUS
  Filled 2022-01-20 (×2): qty 0.4

## 2022-01-20 MED ORDER — SUMATRIPTAN SUCCINATE 50 MG PO TABS: 50.0000 mg | ORAL_TABLET | Freq: Every day | ORAL | Status: DC | PRN

## 2022-01-20 MED ORDER — FLUOXETINE HCL 40 MG PO CAPS: 80.0000 mg | ORAL_CAPSULE | Freq: Every morning | ORAL | Status: DC

## 2022-01-20 MED ORDER — GABAPENTIN 100 MG PO CAPS
200.0000 mg | ORAL_CAPSULE | Freq: Three times a day (TID) | ORAL | Status: DC | PRN
  Administered 2022-01-20 – 2022-01-22 (×3): 200 mg via ORAL
  Filled 2022-01-20 (×3): qty 2

## 2022-01-20 MED ORDER — OXYCODONE-ACETAMINOPHEN 5-325 MG PO TABS
1.0000 | ORAL_TABLET | Freq: Once | ORAL | Status: AC
  Administered 2022-01-20: 1 via ORAL
  Filled 2022-01-20: qty 1

## 2022-01-20 MED ORDER — ATORVASTATIN CALCIUM 40 MG PO TABS
40.0000 mg | ORAL_TABLET | Freq: Every day | ORAL | Status: DC
  Administered 2022-01-20 – 2022-01-22 (×3): 40 mg via ORAL
  Filled 2022-01-20 (×3): qty 1

## 2022-01-20 MED ORDER — ASPIRIN 81 MG PO TBEC
81.0000 mg | DELAYED_RELEASE_TABLET | Freq: Every day | ORAL | Status: DC
  Administered 2022-01-20 – 2022-01-22 (×3): 81 mg via ORAL
  Filled 2022-01-20 (×3): qty 1

## 2022-01-20 MED ORDER — TRAZODONE HCL 100 MG PO TABS
100.0000 mg | ORAL_TABLET | Freq: Every day | ORAL | Status: DC
  Administered 2022-01-20 – 2022-01-22 (×3): 100 mg via ORAL
  Filled 2022-01-20 (×3): qty 1

## 2022-01-20 MED ORDER — OXYCODONE HCL 5 MG PO TABS
5.0000 mg | ORAL_TABLET | ORAL | Status: DC | PRN
  Administered 2022-01-20 – 2022-01-23 (×15): 5 mg via ORAL
  Filled 2022-01-20 (×15): qty 1

## 2022-01-20 MED ORDER — ONDANSETRON HCL 4 MG PO TABS
4.0000 mg | ORAL_TABLET | Freq: Three times a day (TID) | ORAL | Status: DC | PRN
  Administered 2022-01-22 (×2): 4 mg via ORAL
  Filled 2022-01-20 (×2): qty 1

## 2022-01-20 MED ORDER — OXYCODONE-ACETAMINOPHEN 10-325 MG PO TABS: 1.0000 | ORAL_TABLET | ORAL | Status: DC | PRN

## 2022-01-20 MED ORDER — CLINDAMYCIN HCL 300 MG PO CAPS
300.0000 mg | ORAL_CAPSULE | Freq: Once | ORAL | Status: AC
  Administered 2022-01-20: 300 mg via ORAL
  Filled 2022-01-20: qty 1

## 2022-01-20 MED ORDER — CIPROFLOXACIN IN D5W 400 MG/200ML IV SOLN
400.0000 mg | Freq: Two times a day (BID) | INTRAVENOUS | Status: DC
  Filled 2022-01-20: qty 200

## 2022-01-20 MED ORDER — FLUOXETINE HCL 20 MG PO CAPS
80.0000 mg | ORAL_CAPSULE | Freq: Every day | ORAL | Status: DC
  Administered 2022-01-21 – 2022-01-23 (×3): 80 mg via ORAL
  Filled 2022-01-20 (×3): qty 4

## 2022-01-20 MED ORDER — DILTIAZEM HCL ER COATED BEADS 120 MG PO CP24
120.0000 mg | ORAL_CAPSULE | Freq: Every day | ORAL | Status: DC
Start: 2022-01-20 — End: 2022-01-23
  Administered 2022-01-20 – 2022-01-22 (×3): 120 mg via ORAL
  Filled 2022-01-20 (×3): qty 1

## 2022-01-20 MED ORDER — ACETAMINOPHEN 650 MG RE SUPP: 650.0000 mg | Freq: Four times a day (QID) | RECTAL | Status: DC | PRN

## 2022-01-20 MED ORDER — VITAMIN D3 50 MCG (2000 UT) PO CAPS: 2000.0000 [IU] | ORAL_CAPSULE | Freq: Every morning | ORAL | Status: DC

## 2022-01-20 MED ORDER — MORPHINE SULFATE (PF) 2 MG/ML IV SOLN
2.0000 mg | Freq: Once | INTRAVENOUS | Status: DC
  Filled 2022-01-20: qty 1

## 2022-01-20 MED ORDER — SODIUM CHLORIDE 0.9 % IV SOLN
1.0000 g | Freq: Once | INTRAVENOUS | Status: AC
  Administered 2022-01-20: 1 g via INTRAVENOUS
  Filled 2022-01-20: qty 10

## 2022-01-20 MED ORDER — VITAMIN D 25 MCG (1000 UNIT) PO TABS
2000.0000 [IU] | ORAL_TABLET | Freq: Every day | ORAL | Status: DC
  Administered 2022-01-21 – 2022-01-23 (×3): 2000 [IU] via ORAL
  Filled 2022-01-20 (×3): qty 2

## 2022-01-20 MED ORDER — MIRTAZAPINE 15 MG PO TABS
15.0000 mg | ORAL_TABLET | Freq: Every day | ORAL | Status: DC
Start: 2022-01-20 — End: 2022-01-23
  Administered 2022-01-20 – 2022-01-22 (×3): 15 mg via ORAL
  Filled 2022-01-20 (×3): qty 1

## 2022-01-20 MED ORDER — PANTOPRAZOLE SODIUM 40 MG PO TBEC
40.0000 mg | DELAYED_RELEASE_TABLET | Freq: Every day | ORAL | Status: DC
  Administered 2022-01-21 – 2022-01-23 (×3): 40 mg via ORAL
  Filled 2022-01-20 (×3): qty 1

## 2022-01-20 MED ORDER — OXYCODONE-ACETAMINOPHEN 5-325 MG PO TABS
1.0000 | ORAL_TABLET | ORAL | Status: DC | PRN
  Administered 2022-01-20 – 2022-01-23 (×18): 1 via ORAL
  Filled 2022-01-20 (×18): qty 1

## 2022-01-20 MED ORDER — METRONIDAZOLE 500 MG/100ML IV SOLN
500.0000 mg | Freq: Two times a day (BID) | INTRAVENOUS | Status: DC
  Administered 2022-01-20 – 2022-01-21 (×4): 500 mg via INTRAVENOUS
  Filled 2022-01-20 (×4): qty 100

## 2022-01-20 MED ORDER — SODIUM CHLORIDE 0.9 % IV SOLN
1.0000 g | INTRAVENOUS | Status: DC
  Administered 2022-01-21: 1 g via INTRAVENOUS
  Filled 2022-01-20 (×2): qty 10

## 2022-01-20 MED ORDER — CLINDAMYCIN HCL 300 MG PO CAPS
300.0000 mg | ORAL_CAPSULE | Freq: Four times a day (QID) | ORAL | Status: DC
  Filled 2022-01-20: qty 1

## 2022-01-20 MED ORDER — POTASSIUM CHLORIDE CRYS ER 20 MEQ PO TBCR
40.0000 meq | EXTENDED_RELEASE_TABLET | Freq: Every day | ORAL | Status: DC
  Administered 2022-01-20 – 2022-01-22 (×3): 40 meq via ORAL
  Filled 2022-01-20 (×4): qty 2

## 2022-01-20 MED ORDER — ACETAMINOPHEN 325 MG PO TABS: 650.0000 mg | ORAL_TABLET | Freq: Four times a day (QID) | ORAL | Status: DC | PRN

## 2022-01-20 NOTE — ED Triage Notes (Signed)
Patient states she was bit by her own dog 4 days ago. Patient was seen in the ED on 6/24 and reports that she was told to come back if no better. Patient continues to have right hand swelling and pain.

## 2022-01-21 DIAGNOSIS — D509 Iron deficiency anemia, unspecified: Secondary | ICD-10-CM | POA: Diagnosis not present

## 2022-01-21 DIAGNOSIS — Z9049 Acquired absence of other specified parts of digestive tract: Secondary | ICD-10-CM | POA: Diagnosis not present

## 2022-01-21 DIAGNOSIS — I779 Disorder of arteries and arterioles, unspecified: Secondary | ICD-10-CM | POA: Diagnosis not present

## 2022-01-21 DIAGNOSIS — Z96643 Presence of artificial hip joint, bilateral: Secondary | ICD-10-CM | POA: Diagnosis present

## 2022-01-21 DIAGNOSIS — Z90722 Acquired absence of ovaries, bilateral: Secondary | ICD-10-CM | POA: Diagnosis not present

## 2022-01-21 DIAGNOSIS — Z79899 Other long term (current) drug therapy: Secondary | ICD-10-CM | POA: Diagnosis not present

## 2022-01-21 DIAGNOSIS — E876 Hypokalemia: Secondary | ICD-10-CM

## 2022-01-21 DIAGNOSIS — Z23 Encounter for immunization: Secondary | ICD-10-CM | POA: Diagnosis not present

## 2022-01-21 DIAGNOSIS — L0390 Cellulitis, unspecified: Secondary | ICD-10-CM | POA: Diagnosis present

## 2022-01-21 DIAGNOSIS — Z885 Allergy status to narcotic agent status: Secondary | ICD-10-CM | POA: Diagnosis not present

## 2022-01-21 DIAGNOSIS — K219 Gastro-esophageal reflux disease without esophagitis: Secondary | ICD-10-CM | POA: Diagnosis present

## 2022-01-21 DIAGNOSIS — Z7982 Long term (current) use of aspirin: Secondary | ICD-10-CM | POA: Diagnosis not present

## 2022-01-21 DIAGNOSIS — E785 Hyperlipidemia, unspecified: Secondary | ICD-10-CM | POA: Diagnosis present

## 2022-01-21 DIAGNOSIS — I428 Other cardiomyopathies: Secondary | ICD-10-CM | POA: Diagnosis present

## 2022-01-21 DIAGNOSIS — F17210 Nicotine dependence, cigarettes, uncomplicated: Secondary | ICD-10-CM | POA: Diagnosis present

## 2022-01-21 DIAGNOSIS — I10 Essential (primary) hypertension: Secondary | ICD-10-CM | POA: Diagnosis present

## 2022-01-21 DIAGNOSIS — W540XXA Bitten by dog, initial encounter: Secondary | ICD-10-CM | POA: Diagnosis not present

## 2022-01-21 DIAGNOSIS — S61451A Open bite of right hand, initial encounter: Secondary | ICD-10-CM | POA: Diagnosis present

## 2022-01-21 DIAGNOSIS — Z880 Allergy status to penicillin: Secondary | ICD-10-CM | POA: Diagnosis not present

## 2022-01-21 DIAGNOSIS — F419 Anxiety disorder, unspecified: Secondary | ICD-10-CM | POA: Diagnosis present

## 2022-01-21 DIAGNOSIS — Z85828 Personal history of other malignant neoplasm of skin: Secondary | ICD-10-CM | POA: Diagnosis not present

## 2022-01-21 DIAGNOSIS — Z8673 Personal history of transient ischemic attack (TIA), and cerebral infarction without residual deficits: Secondary | ICD-10-CM | POA: Diagnosis not present

## 2022-01-21 DIAGNOSIS — M1990 Unspecified osteoarthritis, unspecified site: Secondary | ICD-10-CM | POA: Diagnosis present

## 2022-01-21 DIAGNOSIS — I2510 Atherosclerotic heart disease of native coronary artery without angina pectoris: Secondary | ICD-10-CM | POA: Diagnosis present

## 2022-01-21 DIAGNOSIS — Z79891 Long term (current) use of opiate analgesic: Secondary | ICD-10-CM | POA: Diagnosis not present

## 2022-01-21 DIAGNOSIS — L03113 Cellulitis of right upper limb: Secondary | ICD-10-CM | POA: Diagnosis not present

## 2022-01-21 DIAGNOSIS — F32A Depression, unspecified: Secondary | ICD-10-CM | POA: Diagnosis not present

## 2022-01-21 LAB — CBC
HCT: 35.6 % — ABNORMAL LOW (ref 36.0–46.0)
Hemoglobin: 10.2 g/dL — ABNORMAL LOW (ref 12.0–15.0)
MCH: 22.1 pg — ABNORMAL LOW (ref 26.0–34.0)
MCHC: 28.7 g/dL — ABNORMAL LOW (ref 30.0–36.0)
MCV: 77.2 fL — ABNORMAL LOW (ref 80.0–100.0)
Platelets: 225 10*3/uL (ref 150–400)
RBC: 4.61 MIL/uL (ref 3.87–5.11)
RDW: 20.3 % — ABNORMAL HIGH (ref 11.5–15.5)
WBC: 5.8 10*3/uL (ref 4.0–10.5)
nRBC: 0 % (ref 0.0–0.2)

## 2022-01-21 LAB — COMPREHENSIVE METABOLIC PANEL
ALT: 10 U/L (ref 0–44)
AST: 11 U/L — ABNORMAL LOW (ref 15–41)
Albumin: 3.1 g/dL — ABNORMAL LOW (ref 3.5–5.0)
Alkaline Phosphatase: 94 U/L (ref 38–126)
Anion gap: 6 (ref 5–15)
BUN: 15 mg/dL (ref 8–23)
CO2: 25 mmol/L (ref 22–32)
Calcium: 8.8 mg/dL — ABNORMAL LOW (ref 8.9–10.3)
Chloride: 115 mmol/L — ABNORMAL HIGH (ref 98–111)
Creatinine, Ser: 0.65 mg/dL (ref 0.44–1.00)
GFR, Estimated: >60 mL/min (ref >60)
Glucose, Bld: 91 mg/dL (ref 70–99)
Potassium: 4 mmol/L (ref 3.5–5.1)
Sodium: 146 mmol/L — ABNORMAL HIGH (ref 135–145)
Total Bilirubin: 1 mg/dL (ref 0.3–1.2)
Total Protein: 5.8 g/dL — ABNORMAL LOW (ref 6.5–8.1)

## 2022-01-21 LAB — HIV ANTIBODY (ROUTINE TESTING W REFLEX): HIV Screen 4th Generation wRfx: NONREACTIVE

## 2022-01-21 MED ORDER — AMOXICILLIN 500 MG PO CAPS
500.0000 mg | ORAL_CAPSULE | Freq: Once | ORAL | Status: AC
Start: 2022-01-21 — End: 2022-01-21
  Administered 2022-01-21: 500 mg via ORAL
  Filled 2022-01-21: qty 1

## 2022-01-21 MED ORDER — EPINEPHRINE 0.3 MG/0.3ML IJ SOAJ: 0.3000 mg | Freq: Once | INTRAMUSCULAR | Status: DC | PRN

## 2022-01-21 MED ORDER — DIPHENHYDRAMINE HCL 50 MG/ML IJ SOLN: 25.0000 mg | Freq: Once | INTRAMUSCULAR | Status: DC | PRN

## 2022-01-21 NOTE — Progress Notes (Signed)
  Transition of Care [facility]) Screening Note   Patient Details  Name: Dorothy White Date of Birth: Feb 09, 1951   Transition of [facility]) CM/SW Contact:    Otelia Santee, LCSW Phone Number: 01/21/2022, 11:20 AM    Transition of Care Department [facility]) has reviewed patient and no TOC needs have been identified at this time. We will continue to monitor patient advancement through interdisciplinary progression rounds. If new patient transition needs arise, please place a TOC consult.

## 2022-01-21 NOTE — Progress Notes (Signed)
Pharmacy Penicillin Allergy Clarfication   Spoke with Dorothy White about her penicillin allergy today. She states that about 45 years ago she broke out in a hive-like rash after having penicillin. She does not recall any shortness of breath or swelling. She didn't require hospitalization - She believes the doctor just switched her antibiotic with no further action.   Per her medical record, it looks like Ms. Forehand may have received amoxicillin before around 2014. She also recognized the name amoxicillin and thinks she has taken it since her initial reaction.   Ms. Cozier is okay with trying oral amoxicillin while she is here. Discussed with MD and RN- Will give amoxicillin x 1 this afternoon and observe vitals closely for 1 hour after the dose.    Sharin Mons, PharmD, BCPS, BCIDP Infectious Diseases Clinical Pharmacist Phone: [phone] 01/21/2022 1:07 PM

## 2022-01-22 DIAGNOSIS — I779 Disorder of arteries and arterioles, unspecified: Secondary | ICD-10-CM

## 2022-01-22 DIAGNOSIS — F32A Depression, unspecified: Secondary | ICD-10-CM | POA: Diagnosis not present

## 2022-01-22 DIAGNOSIS — L03113 Cellulitis of right upper limb: Secondary | ICD-10-CM | POA: Diagnosis not present

## 2022-01-22 DIAGNOSIS — F419 Anxiety disorder, unspecified: Secondary | ICD-10-CM

## 2022-01-22 LAB — RETICULOCYTES
Immature Retic Fract: 20.1 % — ABNORMAL HIGH (ref 2.3–15.9)
RBC.: 4.37 MIL/uL (ref 3.87–5.11)
Retic Count, Absolute: 32.8 10*3/uL (ref 19.0–186.0)
Retic Ct Pct: 0.8 % (ref 0.4–3.1)

## 2022-01-22 LAB — CBC
HCT: 33.7 % — ABNORMAL LOW (ref 36.0–46.0)
Hemoglobin: 9.8 g/dL — ABNORMAL LOW (ref 12.0–15.0)
MCH: 22.1 pg — ABNORMAL LOW (ref 26.0–34.0)
MCHC: 29.1 g/dL — ABNORMAL LOW (ref 30.0–36.0)
MCV: 75.9 fL — ABNORMAL LOW (ref 80.0–100.0)
Platelets: 240 10*3/uL (ref 150–400)
RBC: 4.44 MIL/uL (ref 3.87–5.11)
RDW: 20.1 % — ABNORMAL HIGH (ref 11.5–15.5)
WBC: 4.7 10*3/uL (ref 4.0–10.5)
nRBC: 0 % (ref 0.0–0.2)

## 2022-01-22 LAB — IRON AND TIBC
Iron: 15 ug/dL — ABNORMAL LOW (ref 28–170)
Saturation Ratios: 4 % — ABNORMAL LOW (ref 10.4–31.8)
TIBC: 340 ug/dL (ref 250–450)
UIBC: 325 ug/dL

## 2022-01-22 LAB — VITAMIN B12: Vitamin B-12: 1445 pg/mL — ABNORMAL HIGH (ref 180–914)

## 2022-01-22 LAB — FOLATE: Folate: 7.7 ng/mL (ref >5.9)

## 2022-01-22 LAB — BASIC METABOLIC PANEL
Anion gap: 7 (ref 5–15)
BUN: 14 mg/dL (ref 8–23)
CO2: 24 mmol/L (ref 22–32)
Calcium: 8.5 mg/dL — ABNORMAL LOW (ref 8.9–10.3)
Chloride: 114 mmol/L — ABNORMAL HIGH (ref 98–111)
Creatinine, Ser: 0.65 mg/dL (ref 0.44–1.00)
GFR, Estimated: >60 mL/min (ref >60)
Glucose, Bld: 95 mg/dL (ref 70–99)
Potassium: 4 mmol/L (ref 3.5–5.1)
Sodium: 145 mmol/L (ref 135–145)

## 2022-01-22 LAB — FERRITIN: Ferritin: 15 ng/mL (ref 11–307)

## 2022-01-22 MED ORDER — STERILE WATER FOR INJECTION IJ SOLN
INTRAMUSCULAR | Status: AC
  Filled 2022-01-22: qty 10

## 2022-01-22 MED ORDER — SODIUM CHLORIDE 0.9 % IV SOLN
3.0000 g | Freq: Four times a day (QID) | INTRAVENOUS | Status: DC
  Administered 2022-01-22 – 2022-01-23 (×5): 3 g via INTRAVENOUS
  Filled 2022-01-22 (×6): qty 8

## 2022-01-22 MED ORDER — VANCOMYCIN HCL 1500 MG/300ML IV SOLN
1500.0000 mg | Freq: Once | INTRAVENOUS | Status: AC
  Administered 2022-01-22: 1500 mg via INTRAVENOUS
  Filled 2022-01-22: qty 300

## 2022-01-22 MED ORDER — FERROUS SULFATE 325 (65 FE) MG PO TABS
325.0000 mg | ORAL_TABLET | Freq: Every day | ORAL | Status: DC
Start: 2022-01-22 — End: 2022-01-23
  Administered 2022-01-22: 325 mg via ORAL
  Filled 2022-01-22 (×2): qty 1

## 2022-01-22 MED ORDER — VANCOMYCIN HCL 1250 MG/250ML IV SOLN
1250.0000 mg | INTRAVENOUS | Status: DC
  Administered 2022-01-23: 1250 mg via INTRAVENOUS
  Filled 2022-01-22: qty 250

## 2022-01-22 NOTE — Progress Notes (Signed)
PROGRESS NOTE    Dorothy White  IOE:[id number] DOB: [birth year] DOA: 01/20/2022 PCP: Jonathon Jordan, MD   Brief Narrative:   71 y.o. female with medical history significant of CAD, HTN, GERD, anxiety presented with worsening right arm and hand pain after a dog bite despite being started on oral antibiotics as an outpatient.  Assessment & Plan:   Right forearm and hand cellulitis -Swelling and erythema of the right forearm is improving; still has some erythema and tenderness in the dorsum of the right hand with minimal drainage.  Patient is able to move her fingers better. -Currently on Rocephin and Flagyl: Apparently tolerated amoxicillin yesterday as per pharmacy.  We will switch to Unasyn and vancomycin. -Possible discharge on oral Augmentin in 1 to 2 days.  If condition were to worsen, will need hand surgery evaluation.  Microcytic anemia/iron deficiency anemia -No overt bleeding identified.   -Start oral iron supplementation.  Outpatient follow-up.   -Vitamin B12 and folate levels normal.    Hypokalemia -Resolved  Hyperlipidemia -Continue statin  Bilateral carotid artery disease -Stable.  Continue aspirin and statin.  Essential hypertension -Continue diltiazem  History of anxiety and depression -Continue Prozac, trazodone and mirtazapine  DVT prophylaxis: Lovenox Code Status:  Full code Family Communication: None at bedside Disposition Plan: Status is: Inpatient Remains inpatient appropriate because: Of severity of illness.  Need for IV antibiotics.    Consultants: None  Procedures: None  Antimicrobials:  Anti-infectives (From admission, onward)    Start     Dose/Rate Route Frequency Ordered Stop   01/23/22 1030  vancomycin (VANCOREADY) IVPB 1250 mg/250 mL        1,250 mg 166.7 mL/hr over 90 Minutes Intravenous Every 24 hours 01/22/22 0858     01/22/22 1030  vancomycin (VANCOREADY) IVPB 1500 mg/300 mL        1,500 mg 150 mL/hr over 120 Minutes  Intravenous  Once 01/22/22 0858     01/22/22 1000  Ampicillin-Sulbactam (UNASYN) 3 g in sodium chloride 0.9 % 100 mL IVPB        3 g 200 mL/hr over 30 Minutes Intravenous Every 6 hours 01/22/22 0851     01/21/22 1400  amoxicillin (AMOXIL) capsule 500 mg        500 mg Oral  Once 01/21/22 1306 01/21/22 1424   01/21/22 1000  cefTRIAXone (ROCEPHIN) 1 g in sodium chloride 0.9 % 100 mL IVPB  Status:  Discontinued        1 g 200 mL/hr over 30 Minutes Intravenous Every 24 hours 01/20/22 1347 01/22/22 0851   01/20/22 1800  clindamycin (CLEOCIN) capsule 300 mg  Status:  Discontinued        300 mg Oral Every 6 hours 01/20/22 1347 01/20/22 1418   01/20/22 1030  metroNIDAZOLE (FLAGYL) IVPB 500 mg  Status:  Discontinued        500 mg 100 mL/hr over 60 Minutes Intravenous Every 12 hours 01/20/22 1019 01/22/22 0851   01/20/22 1030  cefTRIAXone (ROCEPHIN) 1 g in sodium chloride 0.9 % 100 mL IVPB        1 g 200 mL/hr over 30 Minutes Intravenous  Once 01/20/22 1019 01/20/22 1109   01/20/22 1015  ciprofloxacin (CIPRO) IVPB 400 mg  Status:  Discontinued        400 mg 200 mL/hr over 60 Minutes Intravenous Every 12 hours 01/20/22 1004 01/20/22 1018   01/20/22 1015  clindamycin (CLEOCIN) capsule 300 mg        300 mg Oral  Once 01/20/22 1007 01/20/22 1015        Subjective: Patient seen and examined at bedside.  Feels that her right hand pain is improving and she is able to move her fingers better.  Right forearm redness is also improving.  Still has some redness and swelling in the right back of the hand.  No overnight fever or vomiting reported.  Objective: Vitals:   01/21/22 1527 01/21/22 2116 01/21/22 2238 01/22/22 0435  BP: 121/63 104/64 (!) 112/52 113/69  Pulse: 65 61 61 (!) 57  Resp: '16 18  16  '$ Temp: 98.6 F (37 C) 98.4 F (36.9 C)  97.7 F (36.5 C)  TempSrc: Oral Oral  Oral  SpO2: 97% 97%  97%  Weight:      Height:        Intake/Output Summary (Last 24 hours) at 01/22/2022 1100 Last data  filed at 01/21/2022 2101 Gross per 24 hour  Intake 700 ml  Output --  Net 700 ml   Filed Weights   01/20/22 0925 01/20/22 1336  Weight: 61.2 kg 66.3 kg    Examination:  General exam: Appears calm and comfortable.  Currently on room air. Respiratory system: Bilateral decreased breath sounds at bases Cardiovascular system: S1 & S2 heard, Rate controlled Gastrointestinal system: Abdomen is nondistended, soft and nontender. Normal bowel sounds heard. Extremities: No cyanosis, clubbing, edema.  Right hand dorsum is still slightly erythematous, tender with some purulent discharge noted.  Right forearm erythema has improved.   Central nervous system: Alert and oriented. No focal neurological deficits. Moving extremities Skin: No rashes, lesions or ulcers Psychiatry: Affect is flat.  No signs of agitation.    Data Reviewed: I have personally reviewed following labs and imaging studies  CBC: Recent Labs  Lab 01/18/22 1242 01/20/22 1011 01/21/22 0522 01/22/22 0539  WBC 10.1 8.7 5.8 4.7  NEUTROABS 8.2* 6.9  --   --   HGB 11.7* 11.6* 10.2* 9.8*  HCT 41.2 38.9 35.6* 33.7*  MCV 76.6* 73.3* 77.2* 75.9*  PLT 284 288 225 401   Basic Metabolic Panel: Recent Labs  Lab 01/18/22 1242 01/20/22 1011 01/20/22 1411 01/21/22 0522 01/22/22 0539  NA 144 143  --  146* 145  K 4.5 3.4*  --  4.0 4.0  CL 110 111  --  115* 114*  CO2 26 21*  --  25 24  GLUCOSE 103* 116*  --  91 95  BUN 14 10  --  15 14  CREATININE 0.76 0.75  --  0.65 0.65  CALCIUM 9.4 9.2  --  8.8* 8.5*  MG  --   --  2.0  --   --    GFR: Estimated Creatinine Clearance: 58.9 mL/min (by C-G formula based on SCr of 0.65 mg/dL). Liver Function Tests: Recent Labs  Lab 01/21/22 0522  AST 11*  ALT 10  ALKPHOS 94  BILITOT 1.0  PROT 5.8*  ALBUMIN 3.1*   No results for input(s): "LIPASE", "AMYLASE" in the last 168 hours. No results for input(s): "AMMONIA" in the last 168 hours. Coagulation Profile: No results for  input(s): "INR", "PROTIME" in the last 168 hours. Cardiac Enzymes: No results for input(s): "CKTOTAL", "CKMB", "CKMBINDEX", "TROPONINI" in the last 168 hours. BNP (last 3 results) No results for input(s): "PROBNP" in the last 8760 hours. HbA1C: No results for input(s): "HGBA1C" in the last 72 hours. CBG: No results for input(s): "GLUCAP" in the last 168 hours. Lipid Profile: No results for input(s): "CHOL", "HDL", "LDLCALC", "TRIG", "  CHOLHDL", "LDLDIRECT" in the last 72 hours. Thyroid Function Tests: No results for input(s): "TSH", "T4TOTAL", "FREET4", "T3FREE", "THYROIDAB" in the last 72 hours. Anemia Panel: Recent Labs    01/22/22 0539  VITAMINB12 1,445*  FOLATE 7.7  FERRITIN 15  TIBC 340  IRON 15*  RETICCTPCT 0.8   Sepsis Labs: No results for input(s): "PROCALCITON", "LATICACIDVEN" in the last 168 hours.  Recent Results (from the past 240 hour(s))  Culture, blood (routine x 2)     Status: None (Preliminary result)   Collection Time: 01/20/22  9:51 AM   Specimen: BLOOD  Result Value Ref Range Status   Specimen Description   Final    BLOOD LEFT ANTECUBITAL Performed at [facility] [address] [place], [place] [address]    Special Requests   Final    BOTTLES DRAWN AEROBIC AND ANAEROBIC Blood Culture adequate volume Performed at [facility] [address] [place], [place] [address]    Culture   Final    NO GROWTH 2 DAYS Performed at [facility] [address], [place], [place] [address]    Report Status PENDING  Incomplete  Culture, blood (routine x 2)     Status: None (Preliminary result)   Collection Time: 01/20/22  1:48 PM   Specimen: BLOOD  Result Value Ref Range Status   Specimen Description   Final    BLOOD BLOOD LEFT HAND Performed at [facility] [address] [place], [place] [address]    Special Requests   Final    BOTTLES DRAWN AEROBIC ONLY Blood Culture adequate  volume Performed at [facility] [address], [place], [place] [address]    Culture   Final    NO GROWTH 2 DAYS Performed at [facility] [address] [place], [place] [address]    Report Status PENDING  Incomplete         Radiology Studies: No results found.      Scheduled Meds:  aspirin EC  81 mg Oral QHS   atorvastatin  40 mg Oral QHS   cholecalciferol  2,000 Units Oral Daily   diltiazem  120 mg Oral QHS   enoxaparin (LOVENOX) injection  40 mg Subcutaneous Q24H   FLUoxetine  80 mg Oral Daily   mirtazapine  15 mg Oral QHS   pantoprazole  40 mg Oral Daily   potassium chloride  40 mEq Oral Daily   traZODone  100 mg Oral QHS   Continuous Infusions:  ampicillin-sulbactam (UNASYN) IV 3 g (01/22/22 0913)   [START ON 01/23/2022] vancomycin     vancomycin 1,500 mg (01/22/22 1042)          Aline August, MD [facility] 01/22/2022, 11:00 AM

## 2022-01-22 NOTE — Progress Notes (Signed)
Pharmacy Antibiotic Note  Dorothy White is a 71 y.o. female admitted on 01/20/2022 with cellulitis of right upper extremity secondary to dog bite. Pharmacy has been consulted for vancomycin dosing due to purulent drainage. SCr 0.65 with CrCl ~ 60 ml/min.    Noted patient tolerated amoxicillin yesterday so also switching Ceftriaxone/Flagyl to Unasyn.    Plan: Vancomycin 1500 mg x 1 then 1250 mg every 24 hours Predicted AUC 491 with Scr 0.8  Monitor renal function, clinical improvement and levels as appropriate   Height: '5\' 5"'$  (165.1 cm) Weight: 66.3 kg (146 lb 2.6 oz) IBW/kg (Calculated) : 57  Temp (24hrs), Avg:98.5 F (36.9 C), Min:97.7 F (36.5 C), Max:98.9 F (37.2 C)  Recent Labs  Lab 01/18/22 1242 01/20/22 1011 01/21/22 0522 01/22/22 0539  WBC 10.1 8.7 5.8 4.7  CREATININE 0.76 0.75 0.65 0.65    Estimated Creatinine Clearance: 58.9 mL/min (by C-G formula based on SCr of 0.65 mg/dL).    Allergies  Allergen Reactions   Opana [Oxymorphone Hcl] Other (See Comments)    hallucinations     Thank you for allowing pharmacy to be a part of this patient's care.  Jimmy Footman, PharmD, BCPS, BCIDP Infectious Diseases Clinical Pharmacist Phone: [phone] 01/22/2022 8:53 AM

## 2022-01-23 DIAGNOSIS — I779 Disorder of arteries and arterioles, unspecified: Secondary | ICD-10-CM | POA: Diagnosis not present

## 2022-01-23 DIAGNOSIS — L03113 Cellulitis of right upper limb: Secondary | ICD-10-CM | POA: Diagnosis not present

## 2022-01-23 DIAGNOSIS — F32A Depression, unspecified: Secondary | ICD-10-CM | POA: Diagnosis not present

## 2022-01-23 LAB — BASIC METABOLIC PANEL
Anion gap: 5 (ref 5–15)
BUN: 11 mg/dL (ref 8–23)
CO2: 25 mmol/L (ref 22–32)
Calcium: 8.4 mg/dL — ABNORMAL LOW (ref 8.9–10.3)
Chloride: 114 mmol/L — ABNORMAL HIGH (ref 98–111)
Creatinine, Ser: 0.64 mg/dL (ref 0.44–1.00)
GFR, Estimated: >60 mL/min (ref >60)
Glucose, Bld: 92 mg/dL (ref 70–99)
Potassium: 4.3 mmol/L (ref 3.5–5.1)
Sodium: 144 mmol/L (ref 135–145)

## 2022-01-23 LAB — C-REACTIVE PROTEIN: CRP: 1.1 mg/dL — ABNORMAL HIGH (ref <1.0)

## 2022-01-23 LAB — MAGNESIUM: Magnesium: 1.7 mg/dL (ref 1.7–2.4)

## 2022-01-23 MED ORDER — ATORVASTATIN CALCIUM 40 MG PO TABS: 40.0000 mg | ORAL_TABLET | Freq: Every day | ORAL | Status: DC

## 2022-01-23 MED ORDER — DOCUSATE SODIUM 100 MG PO CAPS
100.0000 mg | ORAL_CAPSULE | Freq: Every day | ORAL | Status: AC | PRN
Start: 2022-01-23 — End: ?

## 2022-01-23 MED ORDER — AMOXICILLIN-POT CLAVULANATE 875-125 MG PO TABS: 1.0000 | ORAL_TABLET | Freq: Two times a day (BID) | ORAL | 0 refills | Status: AC

## 2022-01-23 MED ORDER — FERROUS SULFATE 325 (65 FE) MG PO TABS
325.0000 mg | ORAL_TABLET | Freq: Every day | ORAL | 0 refills | Status: AC
Start: 2022-01-24 — End: ?

## 2022-01-23 NOTE — Plan of Care (Signed)
DC orders received. Instruction given to pt, all questions answered. PIV removed. Cab voucher given, tt transported to main exit via wheel chair Problem: Education: Goal: Knowledge of General Education information will improve Description: Including pain rating scale, medication(s)/side effects and non-pharmacologic comfort measures Outcome: Adequate for Discharge   Problem: Health Behavior/Discharge Planning: Goal: Ability to manage health-related needs will improve Outcome: Adequate for Discharge   Problem: Clinical Measurements: Goal: Ability to maintain clinical measurements within normal limits will improve Outcome: Adequate for Discharge Goal: Will remain free from infection Outcome: Adequate for Discharge Goal: Diagnostic test results will improve Outcome: Adequate for Discharge Goal: Respiratory complications will improve Outcome: Adequate for Discharge Goal: Cardiovascular complication will be avoided Outcome: Adequate for Discharge

## 2022-01-23 NOTE — Discharge Summary (Signed)
Physician Discharge Summary  Dorothy White XIP:[id number] DOB: 03-23-1951 DOA: 01/20/2022  PCP: Jonathon Jordan, MD  Admit date: 01/20/2022 Discharge date: 01/23/2022  Admitted From: Home Disposition: Home  Recommendations for Outpatient Follow-up:  Follow up with PCP in 1 week with repeat CBC/BMP Outpatient evaluation and follow-up by hand surgery if needed Follow up in ED if symptoms worsen or new appear   Home Health: No Equipment/Devices: None  Discharge Condition: Stable CODE STATUS: Full Diet recommendation: Heart healthy  Brief/Interim Summary: 71 y.o. female with medical history significant of CAD, HTN, GERD, anxiety presented with worsening right arm and hand pain after a dog bite despite being started on oral antibiotics as an outpatient.  During the hospitalization, she tolerated trial of amoxicillin and her antibiotics were subsequently switched to Unasyn and vancomycin.  Her cellulitis has significantly improved; there is still some mild drainage; she feels improved.  She will be discharged home today on oral Augmentin and doxycycline.  Discharge Diagnoses:   Right forearm and hand cellulitis -During the hospitalization, she tolerated trial of amoxicillin and her antibiotics were subsequently switched to Unasyn and vancomycin.  Her cellulitis has significantly improved; there is still some mild drainage; she feels improved.  She will be discharged home today on oral Augmentin and doxycycline.  She will need outpatient evaluation and follow-up by hand surgery if he continues to have problem,   Microcytic anemia/iron deficiency anemia -No overt bleeding identified.   -Continue oral iron supplementation.  Outpatient follow-up.   -Vitamin B12 and folate levels normal.     Hypokalemia -Resolved  Hyperlipidemia -Continue statin  Bilateral carotid artery disease -Stable.  Continue aspirin and statin.  Essential hypertension -Continue diltiazem  History of  anxiety and depression -Continue Prozac, trazodone and mirtazapine    Discharge Instructions  Discharge Instructions     Diet - low sodium heart healthy   Complete by: As directed    Increase activity slowly   Complete by: As directed       Allergies as of 01/23/2022       Reactions   Opana [oxymorphone Hcl] Other (See Comments)   hallucinations        Medication List     STOP taking these medications    clindamycin 150 MG capsule Commonly known as: CLEOCIN       TAKE these medications    acetaminophen 500 MG tablet Commonly known as: TYLENOL Take 1,000 mg by mouth every 8 (eight) hours as needed for headache (pain).   amoxicillin-clavulanate 875-125 MG tablet Commonly known as: AUGMENTIN Take 1 tablet by mouth 2 (two) times daily for 10 days.   aspirin EC 81 MG tablet Take 1 tablet (81 mg total) by mouth daily. Swallow whole. What changed: when to take this   atorvastatin 40 MG tablet Commonly known as: LIPITOR Take 1 tablet (40 mg total) by mouth at bedtime.   diltiazem 120 MG 24 hr capsule Commonly known as: CARDIZEM CD Take 1 capsule (120 mg total) by mouth at bedtime.   docusate sodium 100 MG capsule Commonly known as: COLACE Take 1 capsule (100 mg total) by mouth daily as needed for mild constipation.   doxycycline 100 MG capsule Commonly known as: VIBRAMYCIN Take 1 capsule (100 mg total) by mouth 2 (two) times daily.   ferrous sulfate 325 (65 FE) MG tablet Take 1 tablet (325 mg total) by mouth daily with breakfast. Start taking on: January 24, 2022   FLUoxetine 40 MG capsule Commonly known as: PROZAC  Take 80 mg by mouth every morning.   furosemide 20 MG tablet Commonly known as: LASIX Take 20 mg by mouth daily as needed for fluid or edema (ankle swelling).   gabapentin 100 MG capsule Commonly known as: NEURONTIN Take 200 mg by mouth 3 (three) times daily as needed (nerve pain).   mirtazapine 15 MG tablet Commonly known as:  REMERON TAKE 1 TABLET BY MOUTH EVERYDAY AT BEDTIME What changed: See the new instructions.   omeprazole 20 MG capsule Commonly known as: PRILOSEC Take 20 mg by mouth every morning.   ondansetron 4 MG tablet Commonly known as: ZOFRAN Take 4 mg by mouth daily as needed for nausea or vomiting.   oxyCODONE-acetaminophen 10-325 MG tablet Commonly known as: PERCOCET Take 1 tablet by mouth every 4 (four) hours as needed for pain.   SUMAtriptan 100 MG tablet Commonly known as: IMITREX Take 50-100 mg by mouth daily as needed for migraine.   traZODone 100 MG tablet Commonly known as: DESYREL Take 100 mg by mouth at bedtime.   vitamin B-12 1000 MCG tablet Commonly known as: CYANOCOBALAMIN Take 1,000 mcg by mouth daily. 1 tablet   Vitamin D3 50 MCG (2000 UT) capsule Take 2,000 Units by mouth every morning. 1 capsule        Allergies  Allergen Reactions   Opana [Oxymorphone Hcl] Other (See Comments)    hallucinations    Consultations: None   Procedures/Studies: DG Hand Complete Right  Result Date: 01/18/2022 CLINICAL DATA:  Dog bite 2 days ago. EXAM: RIGHT HAND - COMPLETE 3+ VIEW COMPARISON:  None Available. FINDINGS: No evidence for an acute fracture. No subluxation or dislocation. No evidence for radiopaque soft tissue foreign body. IMPRESSION: Negative. Electronically Signed   By: Misty Stanley M.D.   On: 01/18/2022 12:13   LONG TERM MONITOR (3-14 DAYS)  Result Date: 01/01/2022 Patch Wear Time:  2 days and 22 hours (2023-05-25T11:13:43-0400 to 2023-05-28T09:31:50-0400) Patient had a min HR of 55 bpm, max HR of 200 bpm, and avg HR of 72 bpm. Predominant underlying rhythm was Sinus Rhythm. EVENTS: 19 Supraventricular Tachycardia runs occurred, the run with the fastest interval lasting 9 beats with a max rate of 200 bpm, the longest lasting 10.1 secs with an avg rate of 142 bpm. Supraventricular Tachycardia was detected within +/- 45 seconds of symptomatic patient event(s).  Isolated SVEs were rare (<1.0%), SVE Couplets were rare (<1.0%), and SVE Triplets were rare (<1.0%). Isolated VEs were rare (<1.0%), and no VE Couplets or VE Triplets were present. Patient triggered events corresponded with sinus rhythm and supraventricular tachycardia. No atrial fibrillation, sustained ventricular tachyarrhythmias, or bradyarrhythmias were detected.   ECHOCARDIOGRAM COMPLETE  Result Date: 12/31/2021    ECHOCARDIOGRAM REPORT   Patient Name:   KELSEIGH DIVER Date of Exam: 12/31/2021 Medical Rec #:  [id number]          Height:       65.0 in Accession #:    [id number]         Weight:       143.6 lb Date of Birth:  17-Jul-1951          BSA:          1.718 m Patient Age:    71 years           BP:           116/60 mmHg Patient Gender: F  HR:           79 bpm. Exam Location:  [address] Procedure: 2D Echo, Cardiac Doppler and Color Doppler Indications:    Dyspnea  History:        Patient has prior history of Echocardiogram examinations, most                 recent 09/18/2021.  Sonographer:    Jefferey Pica Referring Phys: [id number] Early [place]  Sonographer Comments: Technically difficult study due to poor echo windows. Image acquisition challenging due to breast implants. IMPRESSIONS  1. Left ventricular ejection fraction, by estimation, is 60 to 65%. The left ventricle has normal function. The left ventricle has no regional wall motion abnormalities. Left ventricular diastolic parameters are consistent with Grade I diastolic dysfunction (impaired relaxation).  2. Right ventricular systolic function is normal. The right ventricular size is normal. There is mildly elevated pulmonary artery systolic pressure. The estimated right ventricular systolic pressure is 10.6 mmHg.  3. The mitral valve is normal in structure. No evidence of mitral valve regurgitation. No evidence of mitral stenosis.  4. The aortic valve is tricuspid. Aortic valve regurgitation is not visualized. No aortic  stenosis is present.  5. The inferior vena cava is normal in size with greater than 50% respiratory variability, suggesting right atrial pressure of 3 mmHg. Comparison(s): No significant change from prior study. FINDINGS  Left Ventricle: Left ventricular ejection fraction, by estimation, is 60 to 65%. The left ventricle has normal function. The left ventricle has no regional wall motion abnormalities. The left ventricular internal cavity size was normal in size. There is  no left ventricular hypertrophy. Left ventricular diastolic parameters are consistent with Grade I diastolic dysfunction (impaired relaxation). Right Ventricle: The right ventricular size is normal. No increase in right ventricular wall thickness. Right ventricular systolic function is normal. There is mildly elevated pulmonary artery systolic pressure. The tricuspid regurgitant velocity is 2.94  m/s, and with an assumed right atrial pressure of 3 mmHg, the estimated right ventricular systolic pressure is 26.9 mmHg. Left Atrium: Left atrial size was normal in size. Right Atrium: Right atrial size was normal in size. Pericardium: Trivial pericardial effusion is present. The pericardial effusion is surrounding the apex. Mitral Valve: The mitral valve is normal in structure. No evidence of mitral valve regurgitation. No evidence of mitral valve stenosis. Tricuspid Valve: The tricuspid valve is normal in structure. Tricuspid valve regurgitation is mild . No evidence of tricuspid stenosis. Aortic Valve: The aortic valve is tricuspid. Aortic valve regurgitation is not visualized. No aortic stenosis is present. Aortic valve peak gradient measures 5.2 mmHg. Pulmonic Valve: The pulmonic valve was not well visualized. Pulmonic valve regurgitation is not visualized. No evidence of pulmonic stenosis. Aorta: The aortic root and ascending aorta are structurally normal, with no evidence of dilitation. Venous: The inferior vena cava is normal in size with greater  than 50% respiratory variability, suggesting right atrial pressure of 3 mmHg. IAS/Shunts: No atrial level shunt detected by color flow Doppler.  LEFT VENTRICLE PLAX 2D LVIDd:         5.10 cm   Diastology LVIDs:         3.40 cm   LV e' medial:    4.73 cm/s LV PW:         0.90 cm   LV E/e' medial:  7.7 LV IVS:        0.90 cm   LV e' lateral:   7.51 cm/s LVOT diam:  2.00 cm   LV E/e' lateral: 4.8 LV SV:         63 LV SV Index:   36 LVOT Area:     3.14 cm  RIGHT VENTRICLE             IVC RV Basal diam:  2.70 cm     IVC diam: 1.60 cm RV S prime:     17.20 cm/s TAPSE (M-mode): 2.0 cm LEFT ATRIUM             Index        RIGHT ATRIUM           Index LA diam:        3.30 cm 1.92 cm/m   RA Area:     10.60 cm LA Vol (A2C):   40.9 ml 23.80 ml/m  RA Volume:   22.90 ml  13.33 ml/m LA Vol (A4C):   40.7 ml 23.69 ml/m LA Biplane Vol: 42.2 ml 24.56 ml/m  AORTIC VALVE                 PULMONIC VALVE AV Area (Vmax): 2.87 cm     PV Vmax:       0.82 m/s AV Vmax:        114.00 cm/s  PV Peak grad:  2.7 mmHg AV Peak Grad:   5.2 mmHg LVOT Vmax:      104.00 cm/s LVOT Vmean:     63.900 cm/s LVOT VTI:       0.199 m  AORTA Ao Root diam: 3.30 cm Ao Asc diam:  3.70 cm MITRAL VALVE               TRICUSPID VALVE MV Area (PHT): 3.95 cm    TR Peak grad:   34.6 mmHg MV Decel Time: 192 msec    TR Vmax:        294.00 cm/s MV E velocity: 36.40 cm/s MV A velocity: 52.90 cm/s  SHUNTS MV E/A ratio:  0.69        Systemic VTI:  0.20 m                            Systemic Diam: 2.00 cm Rudean Haskell MD Electronically signed by Rudean Haskell MD Signature Date/Time: 12/31/2021/7:43:07 PM    Final    VAS US CAROTID  Result Date: 12/24/2021 Carotid Arterial Duplex Study Patient Name:  [name]  Date of Exam:   12/24/2021 Medical Rec #: [id number]           Accession #:    [id number] Date of Birth: 02/06/1951           Patient Gender: F Patient Age:   40 years Exam Location:  [facility] Procedure:      VAS US CAROTID Referring Phys:  Lenna Sciara --------------------------------------------------------------------------------  Indications:       Carotid artery disease and patient reports dizziness when                    getting up too quickly x 1 month and double vision x 1 year.                    She denies any other cerebrovascular symptoms. Risk Factors:      Hyperlipidemia, current smoker. Other Factors:     TIA. Comparison Study:  In 01/2017, a carotid duplex performed at [facility]  showed velocities of 111/48 cm/s in the RICA and 155/56 cm/s                    in the LICA, distal segment. Performing Technologist: Sharlett Iles RVT  Examination Guidelines: A complete evaluation includes B-mode imaging, spectral Doppler, color Doppler, and power Doppler as needed of all accessible portions of each vessel. Bilateral testing is considered an integral part of a complete examination. Limited examinations for reoccurring indications may be performed as noted.  Right Carotid Findings: +----------+--------+--------+--------+------------------+---------------------+           PSV cm/sEDV cm/sStenosisPlaque DescriptionComments              +----------+--------+--------+--------+------------------+---------------------+ CCA Prox  94      20                                                      +----------+--------+--------+--------+------------------+---------------------+ CCA Distal68      24                                                      +----------+--------+--------+--------+------------------+---------------------+ ICA Prox  62      19      Normal                                          +----------+--------+--------+--------+------------------+---------------------+ ICA Mid   89      35                                                      +----------+--------+--------+--------+------------------+---------------------+ ICA Distal110     37                                 tortuous and                                                              bead-like appearance  +----------+--------+--------+--------+------------------+---------------------+ ECA       102     17                                                      +----------+--------+--------+--------+------------------+---------------------+ +----------+--------+-------+----------------+-------------------+           PSV cm/sEDV cmsDescribe        Arm Pressure (mmHG) +----------+--------+-------+----------------+-------------------+ [id number]             Multiphasic, [id number]                 +----------+--------+-------+----------------+-------------------+ +---------+--------+--+--------+--+---------+ VertebralPSV  cm/s50EDV cm/s16Antegrade +---------+--------+--+--------+--+---------+  Left Carotid Findings: +----------+--------+--------+--------+------------------+--------------------+           PSV cm/sEDV cm/sStenosisPlaque DescriptionComments             +----------+--------+--------+--------+------------------+--------------------+ CCA Prox  89      25                                                     +----------+--------+--------+--------+------------------+--------------------+ CCA Distal69      21                                                     +----------+--------+--------+--------+------------------+--------------------+ ICA Prox  60      21      Normal                                         +----------+--------+--------+--------+------------------+--------------------+ ICA Mid   102     42                                steep dive           +----------+--------+--------+--------+------------------+--------------------+ ICA Distal178     66                                bead-like appearance +----------+--------+--------+--------+------------------+--------------------+ ECA       72      12                                                      +----------+--------+--------+--------+------------------+--------------------+ +----------+--------+--------+----------------+-------------------+           PSV cm/sEDV cm/sDescribe        Arm Pressure (mmHG) +----------+--------+--------+----------------+-------------------+ [id number]             Multiphasic, [id number]                 +----------+--------+--------+----------------+-------------------+ +---------+--------+--+--------+--+---------+ VertebralPSV cm/s43EDV cm/s15Antegrade +---------+--------+--+--------+--+---------+   Summary: Right Carotid: There is no evidence of stenosis in the right ICA. Bead-like                appearance in the distal segment with mildly elevated velocities                suggestive for fibromuscular dysplasia. Left Carotid: There is no evidence of stenosis in the left ICA. Bead-like               appearance in the distal segment with elevated velocities               suggestive for fibromuscular dysplasia. Vertebrals:  Bilateral vertebral arteries demonstrate antegrade flow. Subclavians: Normal flow hemodynamics were seen in bilateral subclavian              arteries. *See table(s) above for measurements and observations.  Electronically signed by Carlyle Dolly MD on 12/24/2021  at 4:18:18 PM.    Final       Subjective: Patient seen and examined at bedside.  Right hand swelling and redness has much improved although she still has some pain while moving her fingers but feels okay to go home today.  No fever or vomiting reported.  Discharge Exam: Vitals:   01/23/22 0322 01/23/22 1015  BP: (!) 143/64 122/68  Pulse: 68 65  Resp: 17 17  Temp: 98.2 F (36.8 C) 98 F (36.7 C)  SpO2: 96% 98%    General: Pt is alert, awake, not in acute distress Cardiovascular: rate controlled, S1/S2 + Respiratory: bilateral decreased breath sounds at bases Abdominal: Soft, NT, ND, bowel sounds + Extremities: no edema, no cyanosis.  Right hand  dorsum is only minimally erythematous, tender with no purulence noted today.  Right forearm erythema has improved.      The results of significant diagnostics from this hospitalization (including imaging, microbiology, ancillary and laboratory) are listed below for reference.     Microbiology: Recent Results (from the past 240 hour(s))  Culture, blood (routine x 2)     Status: None (Preliminary result)   Collection Time: 01/20/22  9:51 AM   Specimen: BLOOD  Result Value Ref Range Status   Specimen Description   Final    BLOOD LEFT ANTECUBITAL Performed at [facility] [address] [place], [place] [address]    Special Requests   Final    BOTTLES DRAWN AEROBIC AND ANAEROBIC Blood Culture adequate volume Performed at [facility] [address], [place], [place] [address]    Culture   Final    NO GROWTH 3 DAYS Performed at [facility], [place] [address] [place], [place] [address]    Report Status PENDING  Incomplete  Culture, blood (routine x 2)     Status: None (Preliminary result)   Collection Time: 01/20/22  1:48 PM   Specimen: BLOOD  Result Value Ref Range Status   Specimen Description   Final    BLOOD BLOOD LEFT HAND Performed at [facility] [address], [place], [place] [address]    Special Requests   Final    BOTTLES DRAWN AEROBIC ONLY Blood Culture adequate volume Performed at [facility] [address], [place], [place] [address]    Culture   Final    NO GROWTH 3 DAYS Performed at [facility], [place] [address], [place], [place] [address]    Report Status PENDING  Incomplete     Labs: BNP (last 3 results) Recent Labs    05/31/21 1502  BNP 440.3*   Basic Metabolic Panel: Recent Labs  Lab 01/18/22 1242 01/20/22 1011 01/20/22 1411 01/21/22 0522 01/22/22 0539 01/23/22 0510  NA 144 143  --  146* 145 144  K 4.5 3.4*  --  4.0 4.0 4.3  CL 110 111  --  115*  114* 114*  CO2 26 21*  --  '25 24 25  '$ GLUCOSE 103* 116*  --  91 95 92  BUN 14 10  --  '15 14 11  '$ CREATININE 0.76 0.75  --  0.65 0.65 0.64  CALCIUM 9.4 9.2  --  8.8* 8.5* 8.4*  MG  --   --  2.0  --   --  1.7   Liver Function Tests: Recent Labs  Lab 01/21/22 0522  AST 11*  ALT 10  ALKPHOS 94  BILITOT 1.0  PROT 5.8*  ALBUMIN 3.1*  No results for input(s): "LIPASE", "AMYLASE" in the last 168 hours. No results for input(s): "AMMONIA" in the last 168 hours. CBC: Recent Labs  Lab 01/18/22 1242 01/20/22 1011 01/21/22 0522 01/22/22 0539  WBC 10.1 8.7 5.8 4.7  NEUTROABS 8.2* 6.9  --   --   HGB 11.7* 11.6* 10.2* 9.8*  HCT 41.2 38.9 35.6* 33.7*  MCV 76.6* 73.3* 77.2* 75.9*  PLT 284 288 225 240   Cardiac Enzymes: No results for input(s): "CKTOTAL", "CKMB", "CKMBINDEX", "TROPONINI" in the last 168 hours. BNP: Invalid input(s): "POCBNP" CBG: No results for input(s): "GLUCAP" in the last 168 hours. D-Dimer No results for input(s): "DDIMER" in the last 72 hours. Hgb A1c No results for input(s): "HGBA1C" in the last 72 hours. Lipid Profile No results for input(s): "CHOL", "HDL", "LDLCALC", "TRIG", "CHOLHDL", "LDLDIRECT" in the last 72 hours. Thyroid function studies No results for input(s): "TSH", "T4TOTAL", "T3FREE", "THYROIDAB" in the last 72 hours.  Invalid input(s): "FREET3" Anemia work up Recent Labs    01/22/22 0539  VITAMINB12 1,445*  FOLATE 7.7  FERRITIN 15  TIBC 340  IRON 15*  RETICCTPCT 0.8   Urinalysis    Component Value Date/Time   COLORURINE YELLOW 10/31/2020 2300   APPEARANCEUR HAZY (A) 10/31/2020 2300   LABSPEC 1.012 10/31/2020 2300   PHURINE 7.0 10/31/2020 2300   GLUCOSEU NEGATIVE 10/31/2020 2300   GLUCOSEU NEGATIVE 04/13/2007 1417   HGBUR NEGATIVE 10/31/2020 2300   BILIRUBINUR NEGATIVE 10/31/2020 2300   KETONESUR NEGATIVE 10/31/2020 2300   PROTEINUR NEGATIVE 10/31/2020 2300   UROBILINOGEN 0.2 02/04/2015 1800   NITRITE NEGATIVE 10/31/2020 2300    LEUKOCYTESUR NEGATIVE 10/31/2020 2300   Sepsis Labs Recent Labs  Lab 01/18/22 1242 01/20/22 1011 01/21/22 0522 01/22/22 0539  WBC 10.1 8.7 5.8 4.7   Microbiology Recent Results (from the past 240 hour(s))  Culture, blood (routine x 2)     Status: None (Preliminary result)   Collection Time: 01/20/22  9:51 AM   Specimen: BLOOD  Result Value Ref Range Status   Specimen Description   Final    BLOOD LEFT ANTECUBITAL Performed at [facility], [place] [address] [place], [place] [address]    Special Requests   Final    BOTTLES DRAWN AEROBIC AND ANAEROBIC Blood Culture adequate volume Performed at [facility] [address], [place], [place] [address]    Culture   Final    NO GROWTH 3 DAYS Performed at [facility], [place] [address] [place], [place] [address]    Report Status PENDING  Incomplete  Culture, blood (routine x 2)     Status: None (Preliminary result)   Collection Time: 01/20/22  1:48 PM   Specimen: BLOOD  Result Value Ref Range Status   Specimen Description   Final    BLOOD BLOOD LEFT HAND Performed at [facility] [address], [place], [place] [address]    Special Requests   Final    BOTTLES DRAWN AEROBIC ONLY Blood Culture adequate volume Performed at [facility] [address], [place], [place] [address]    Culture   Final    NO GROWTH 3 DAYS Performed at [facility], [place] [address], [place], [place] [address]    Report Status PENDING  Incomplete     Time coordinating discharge: 35 minutes  SIGNED:   Aline August, MD  [facility] 01/23/2022, 11:19 AM

## 2022-01-23 NOTE — Plan of Care (Signed)
  Problem: Education: Goal: Knowledge of General Education information will improve Description: Including pain rating scale, medication(s)/side effects and non-pharmacologic comfort measures Outcome: Progressing   Problem: Health Behavior/Discharge Planning: Goal: Ability to manage health-related needs will improve Outcome: Progressing   Problem: Coping: Goal: Level of anxiety will decrease Outcome: Progressing   Problem: Pain Managment: Goal: General experience of comfort will improve Outcome: Progressing   Problem: Skin Integrity: Goal: Skin integrity will improve Outcome: Progressing

## 2022-01-23 NOTE — Care Management Important Message (Signed)
Important Message  Patient Details  Name: Dorothy White MRN: [id number] Date of Birth: June 06, 1951   Medicare Important Message Given:  Yes     Memory Argue 01/23/2022, 3:19 PM

## 2022-01-25 LAB — CULTURE, BLOOD (ROUTINE X 2)
Culture: NO GROWTH
Culture: NO GROWTH
Special Requests: ADEQUATE
Special Requests: ADEQUATE

## 2022-02-04 ENCOUNTER — Other Ambulatory Visit: Payer: Medicare Other

## 2022-02-05 MED ORDER — METOPROLOL TARTRATE 100 MG PO TABS: ORAL_TABLET | ORAL | 0 refills | Status: DC

## 2022-02-05 NOTE — Addendum Note (Signed)
Addended by: Rodman Key on: 02/05/2022 05:36 PM   Modules accepted: Orders

## 2022-02-07 IMAGING — US US BREAST*L* LIMITED INC AXILLA
1 series · 8 of 8 positions shown · non-contrast
Comparison: Previous exam(s).

CLINICAL DATA: 68-year-old female presenting with new lumps in the
bilateral breasts. Patient has a history of implants which were
placed over 30 years ago.

EXAM:
DIGITAL DIAGNOSTIC BILATERAL MAMMOGRAM WITH IMPLANTS, CAD AND TOMO
ULTRASOUND BILATERAL BREAST
The patient has retroglandular implants. Standard and implant
displaced views were performed.

[Series 1: us breast*left* limited inc axilla · 0.06mm/px · 8 of 8 slices shown]
[im 1/8]
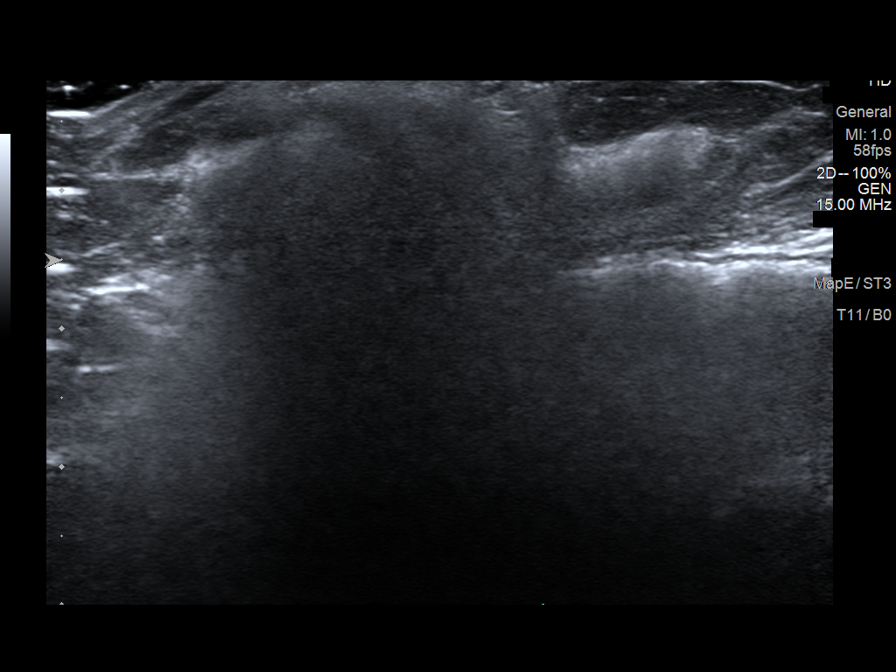
[im 2/8]
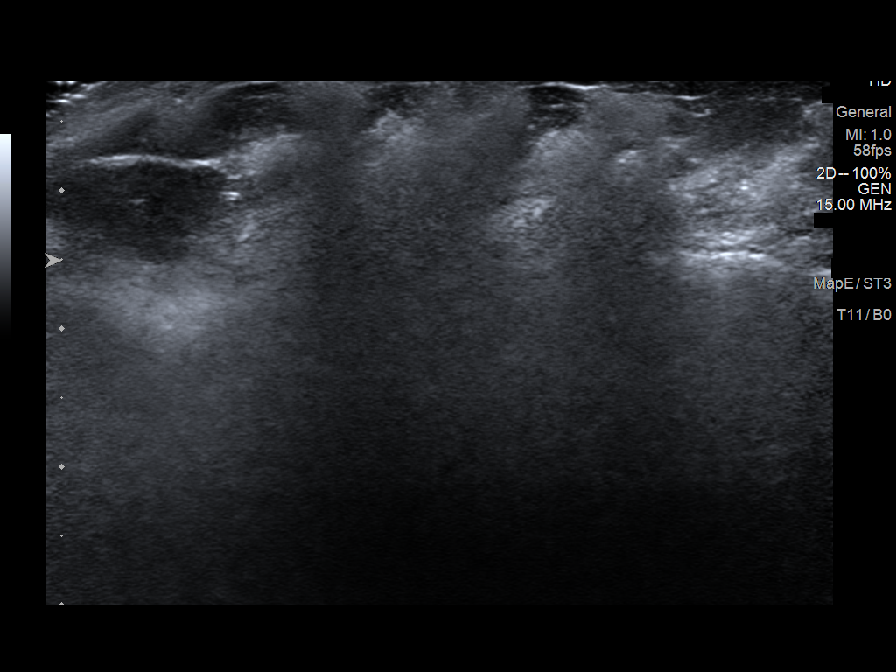
[im 3/8]
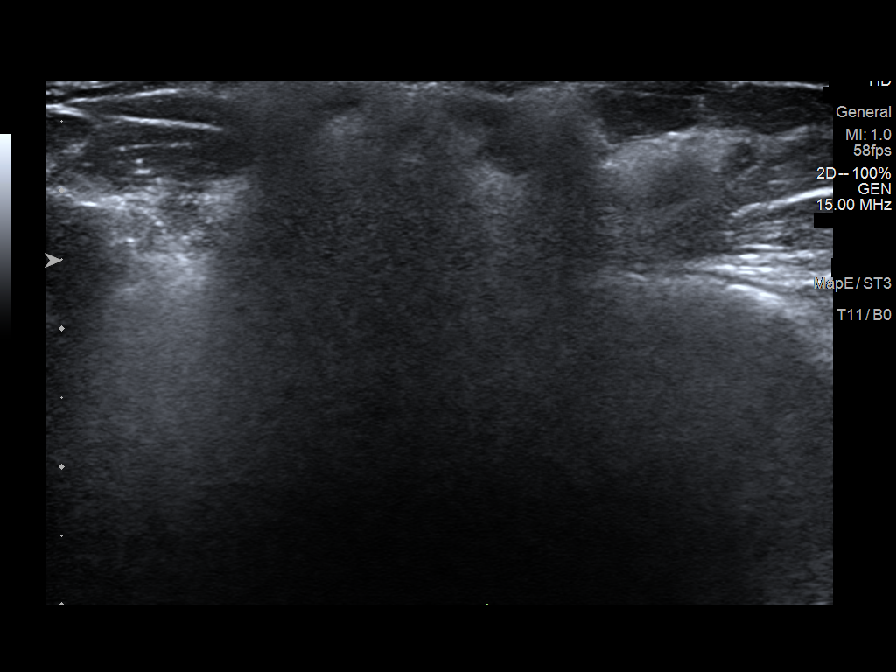
[im 4/8]
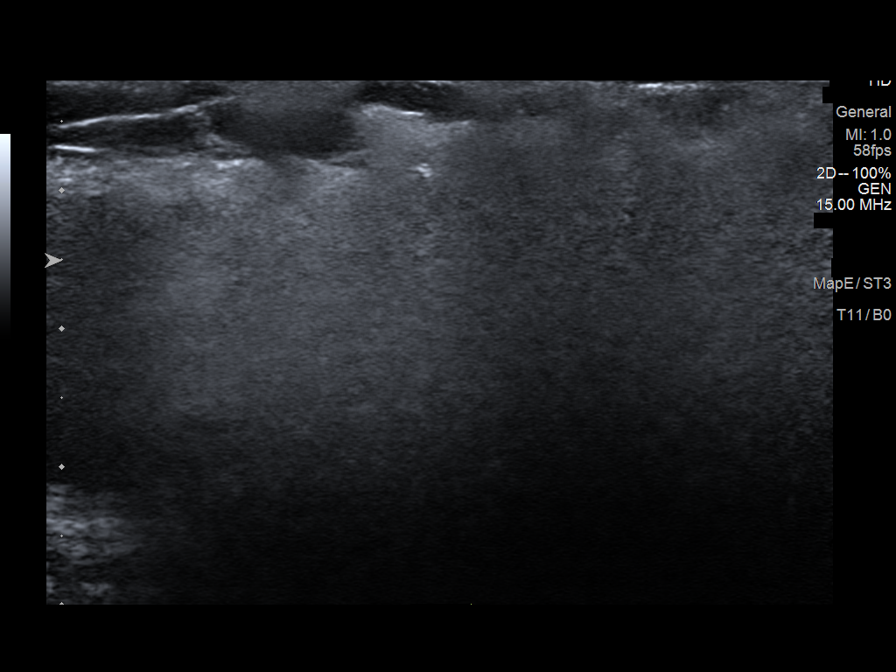
[im 5/8]
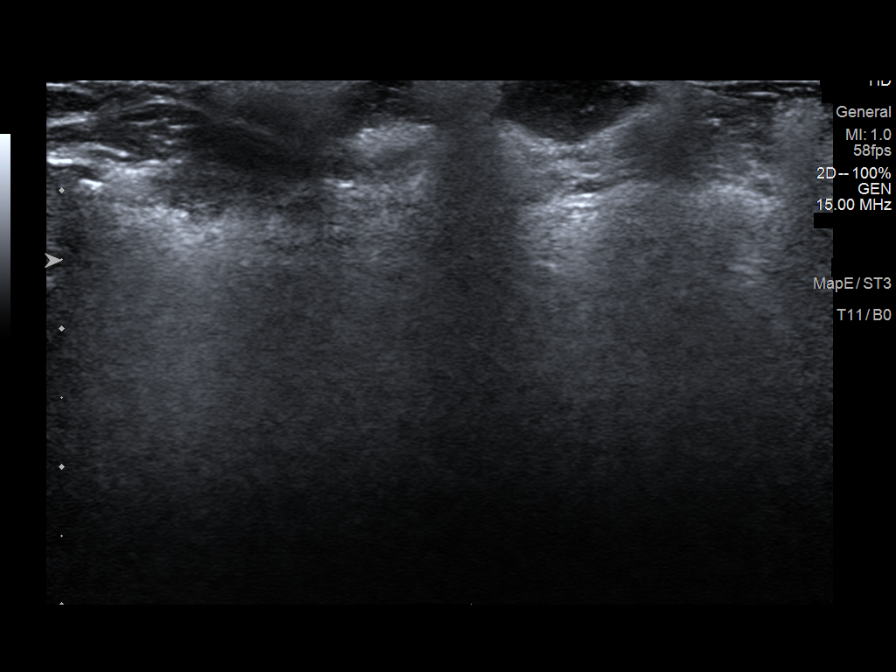
[im 6/8]
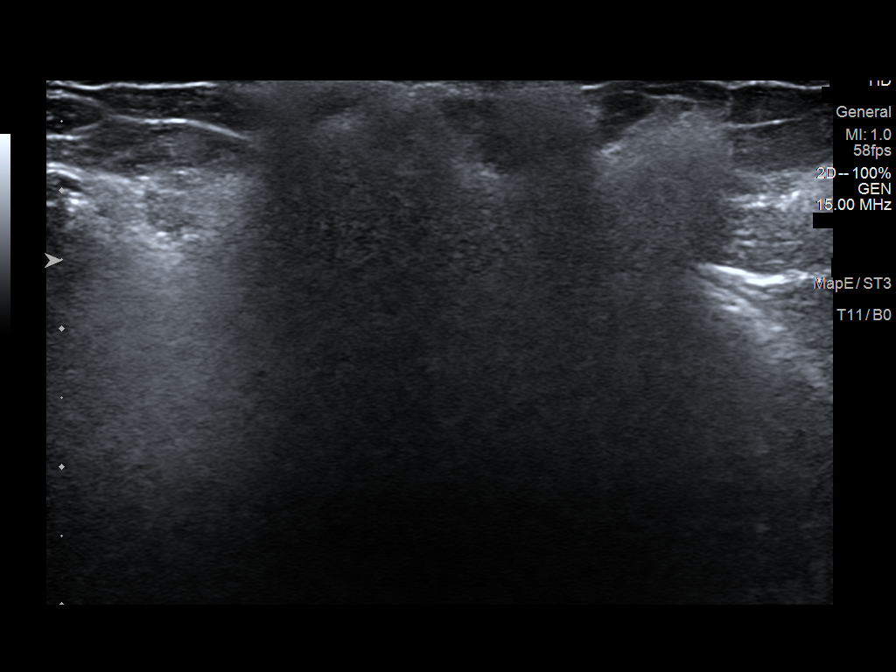
[im 7/8]
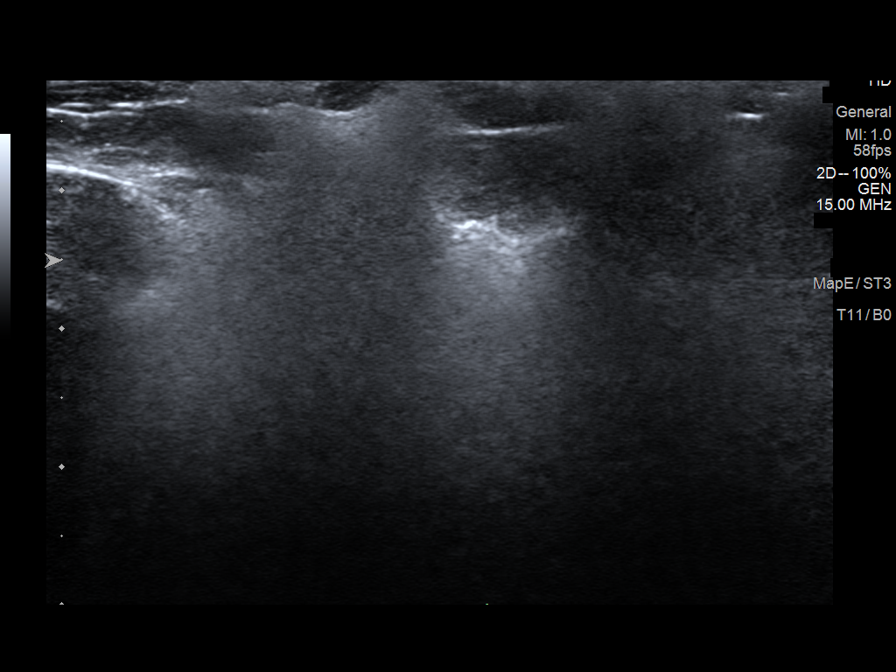
[im 8/8]
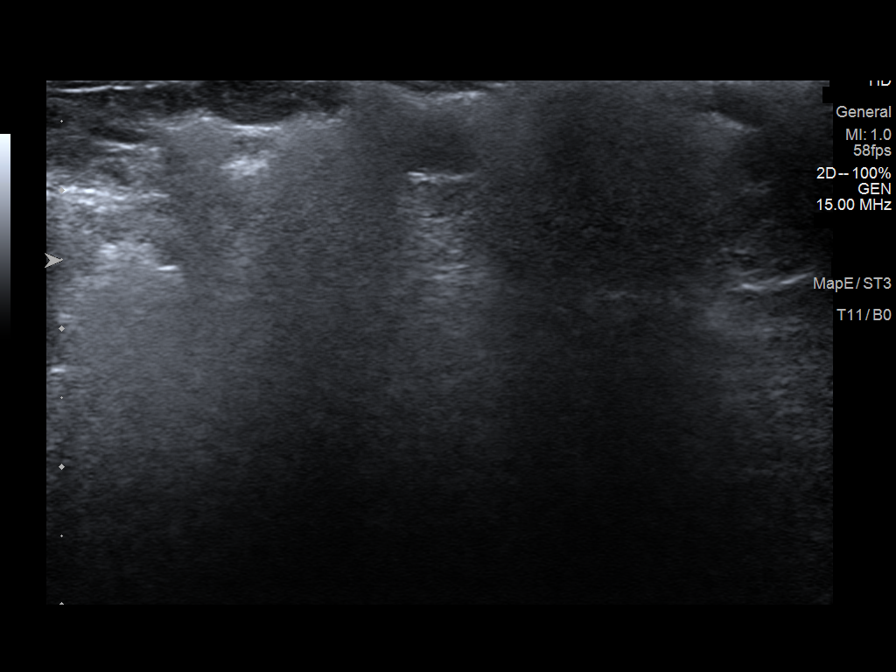

[8 of 8 positions shown; findings below may reference images not displayed]

ACR Breast Density Category c: The breast tissue is heterogeneously
dense, which may obscure small masses.
FINDINGS: Mammogram:

Right breast: Spot tangential views were performed at the palpable
site of concern marked by a BB in the lower outer aspect of the
breast. On the spot views there is suggestion of implant disruption
and multiple small masses in the adjacent tissue concerning for
rupture. There are no findings suspicious for malignancy in the
right breast.

Left breast: Spot tangential views were performed at the palpable
site of concern marked by a skin BB in the inferior left breast
demonstrating multiple high attenuation masses most likely
representing sequela of silicone implant rupture. There is contour
abnormality of the left breast implant most pronounced along the
medial aspect. There are no suspicious findings in the left breast.

Mammographic images were processed with CAD.

On physical exam, I palpate mobile masses in the bilateral breasts
in the lower outer right breast as well as in the medial left
breast.

Ultrasound:

Targeted ultrasound is performed in the right breast at 7 and 9
o'clock demonstrating disruption of the implant contour and a
snowstorm appearance consistent with extracapsular silicone.

Targeted ultrasound is performed in the left breast at 9-11 o'clock
also demonstrating disruption of the implant contour with multiple
masses demonstrating dirty shadowing consistent with extracapsular
silicone.
IMPRESSION: Bilateral extracapsular silicone implant rupture. No evidence of
malignancy within the limitations of extensive extracapsular
silicone.

RECOMMENDATION:
1.  Surgical consultation.

2.  Screening mammogram in one year.(Code:GL-S-YGG)

I have discussed the findings and recommendations with the patient.
If applicable, a reminder letter will be sent to the patient
regarding the next appointment.

BI-RADS CATEGORY  2: Benign.

## 2022-02-07 IMAGING — US US BREAST*R* LIMITED INC AXILLA
1 series · 7 of 7 positions shown · non-contrast
Comparison: Previous exam(s).

CLINICAL DATA: 68-year-old female presenting with new lumps in the
bilateral breasts. Patient has a history of implants which were
placed over 30 years ago.

EXAM:
DIGITAL DIAGNOSTIC BILATERAL MAMMOGRAM WITH IMPLANTS, CAD AND TOMO
ULTRASOUND BILATERAL BREAST
The patient has retroglandular implants. Standard and implant
displaced views were performed.

[Series 1: us breast*right* limited inc axilla · 0.07mm/px · 7 of 7 slices shown]
[im 1/7]
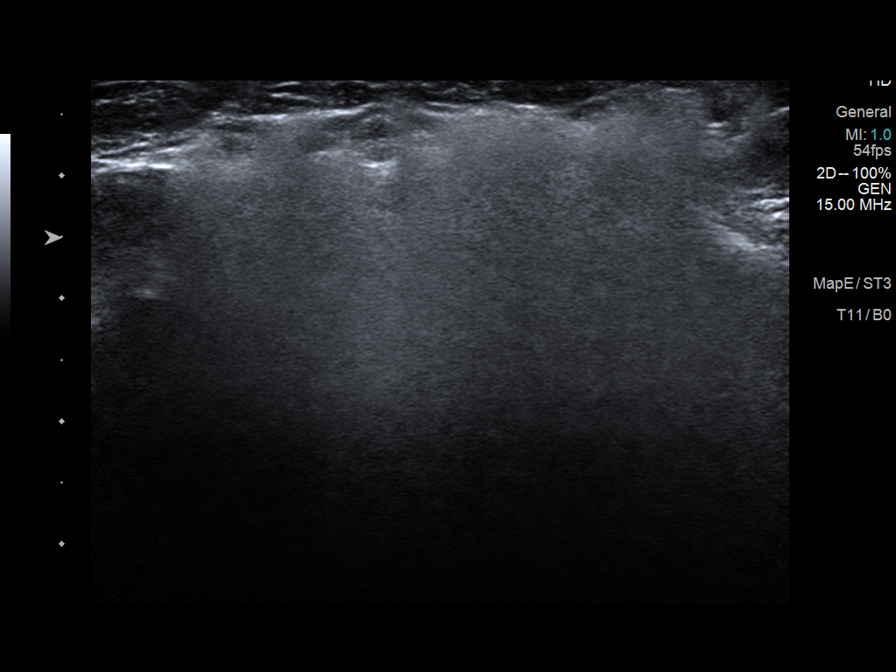
[im 2/7]
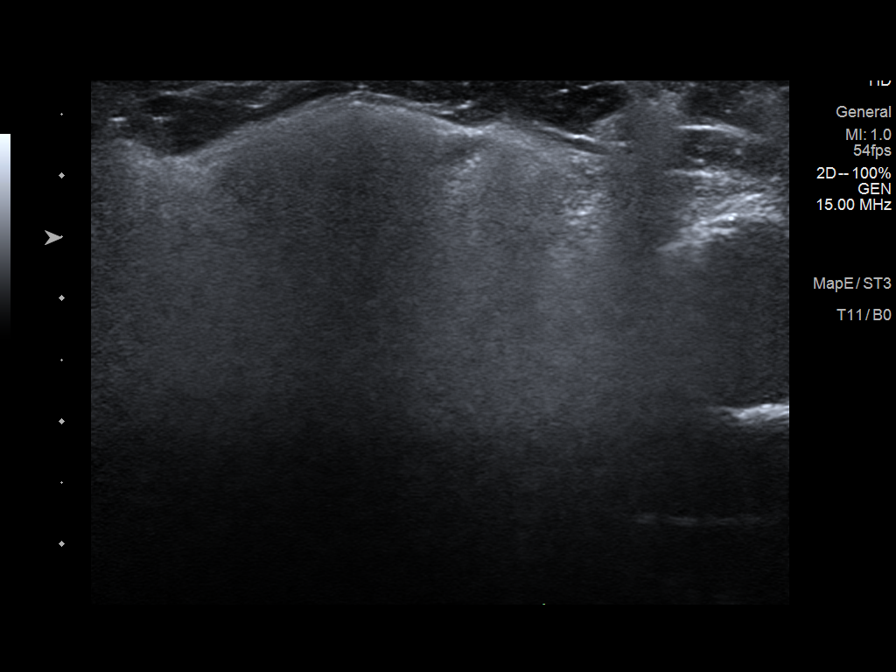
[im 3/7]
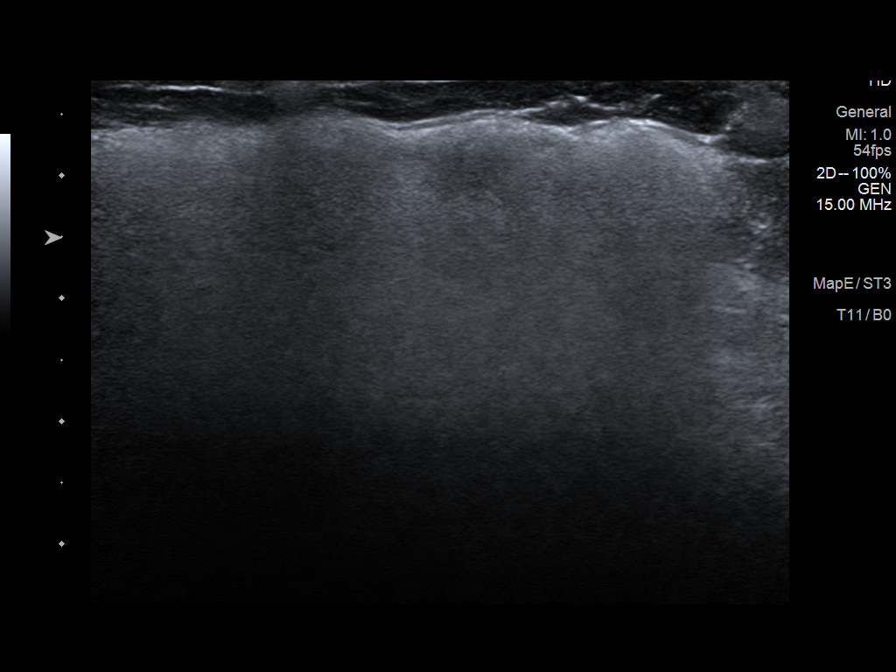
[im 4/7]
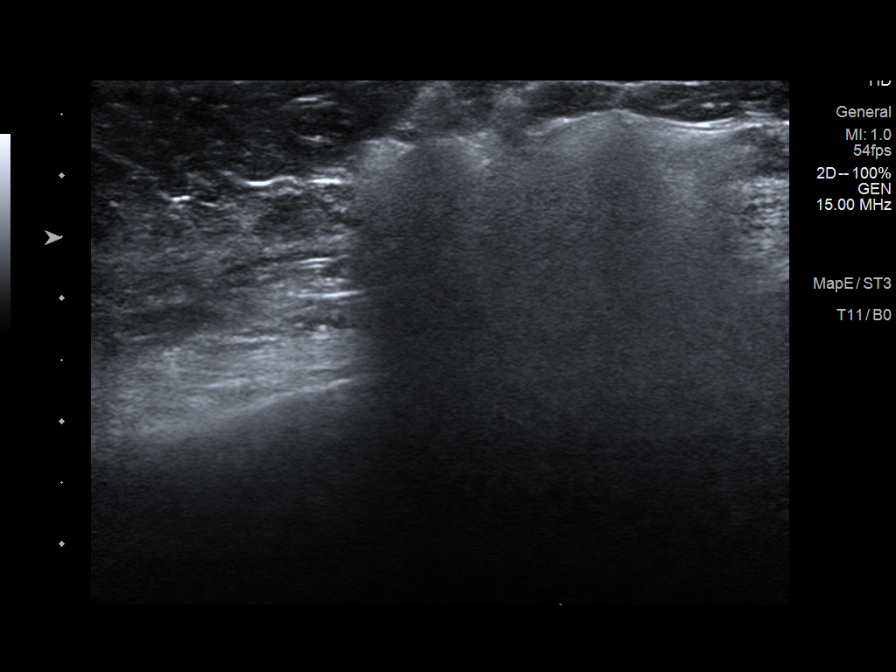
[im 5/7]
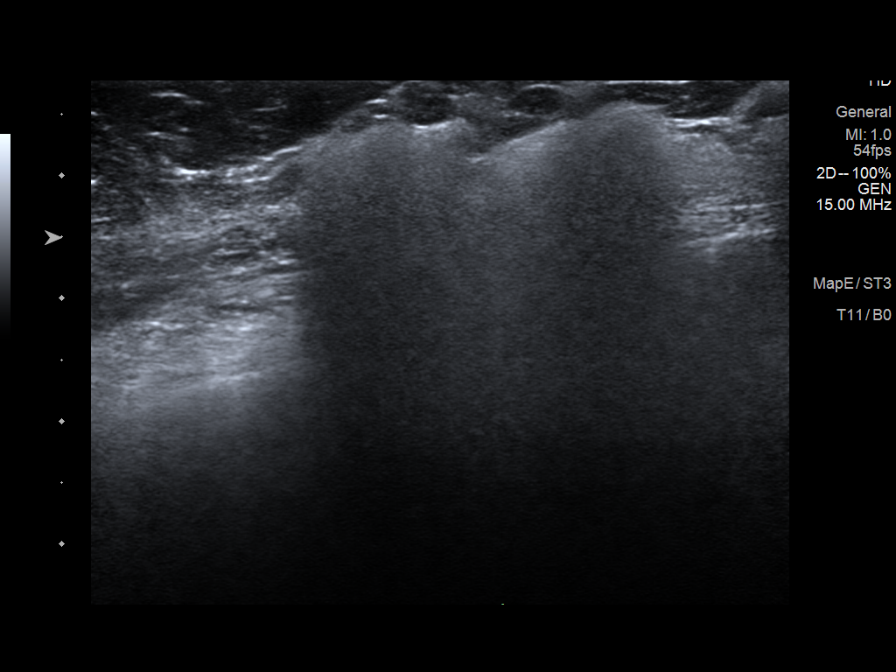
[im 6/7]
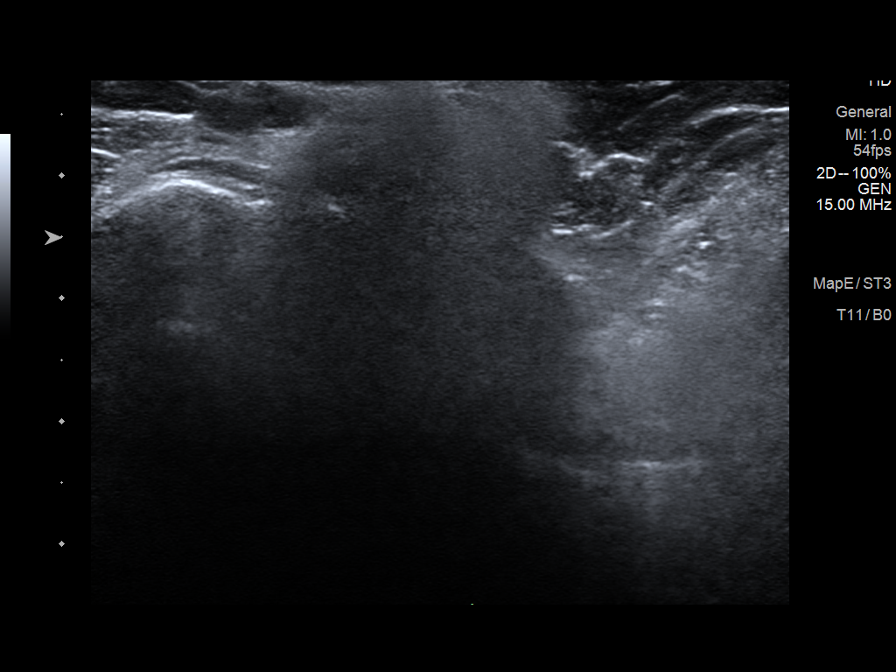
[im 7/7]
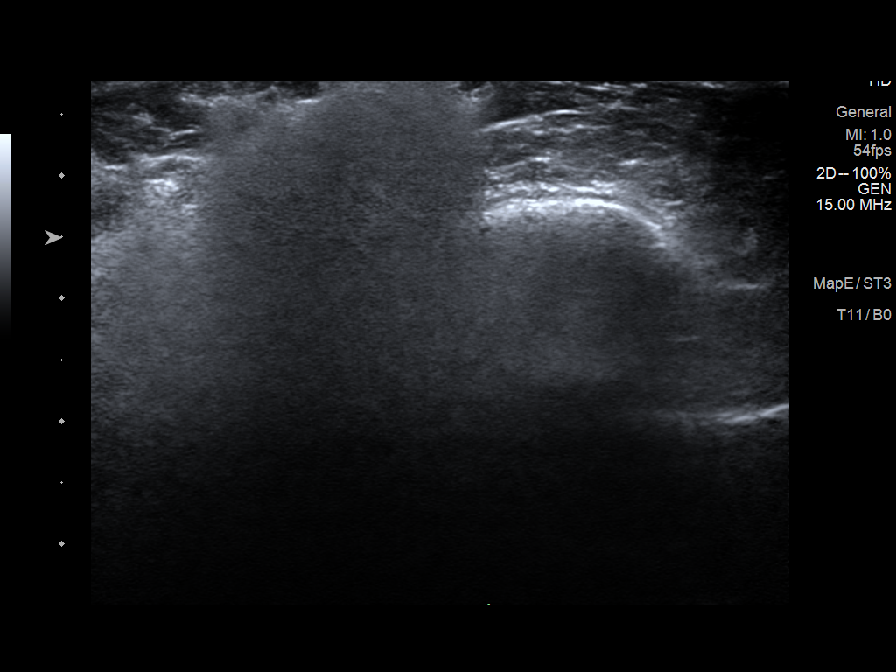

[7 of 7 positions shown; findings below may reference images not displayed]

ACR Breast Density Category c: The breast tissue is heterogeneously
dense, which may obscure small masses.
FINDINGS: Mammogram:

Right breast: Spot tangential views were performed at the palpable
site of concern marked by a BB in the lower outer aspect of the
breast. On the spot views there is suggestion of implant disruption
and multiple small masses in the adjacent tissue concerning for
rupture. There are no findings suspicious for malignancy in the
right breast.

Left breast: Spot tangential views were performed at the palpable
site of concern marked by a skin BB in the inferior left breast
demonstrating multiple high attenuation masses most likely
representing sequela of silicone implant rupture. There is contour
abnormality of the left breast implant most pronounced along the
medial aspect. There are no suspicious findings in the left breast.

Mammographic images were processed with CAD.

On physical exam, I palpate mobile masses in the bilateral breasts
in the lower outer right breast as well as in the medial left
breast.

Ultrasound:

Targeted ultrasound is performed in the right breast at 7 and 9
o'clock demonstrating disruption of the implant contour and a
snowstorm appearance consistent with extracapsular silicone.

Targeted ultrasound is performed in the left breast at 9-11 o'clock
also demonstrating disruption of the implant contour with multiple
masses demonstrating dirty shadowing consistent with extracapsular
silicone.
IMPRESSION: Bilateral extracapsular silicone implant rupture. No evidence of
malignancy within the limitations of extensive extracapsular
silicone.

RECOMMENDATION:
1.  Surgical consultation.

2.  Screening mammogram in one year.(Code:GL-S-YGG)

I have discussed the findings and recommendations with the patient.
If applicable, a reminder letter will be sent to the patient
regarding the next appointment.

BI-RADS CATEGORY  2: Benign.

## 2022-02-17 ENCOUNTER — Other Ambulatory Visit: Payer: Self-pay | Admitting: Internal Medicine

## 2022-03-03 DIAGNOSIS — G894 Chronic pain syndrome: Secondary | ICD-10-CM | POA: Diagnosis not present

## 2022-03-03 DIAGNOSIS — Z5181 Encounter for therapeutic drug level monitoring: Secondary | ICD-10-CM | POA: Diagnosis not present

## 2022-03-03 DIAGNOSIS — M47896 Other spondylosis, lumbar region: Secondary | ICD-10-CM | POA: Diagnosis not present

## 2022-03-10 ENCOUNTER — Telehealth (HOSPITAL_COMMUNITY): Payer: Self-pay | Admitting: *Deleted

## 2022-03-10 ENCOUNTER — Encounter (HOSPITAL_COMMUNITY): Payer: Self-pay

## 2022-03-10 NOTE — Telephone Encounter (Signed)
Attempted to call patient regarding upcoming cardiac CT appointment. °Left message on voicemail with name and callback number ° °Avaya Mcjunkins RN Navigator Cardiac Imaging °[facility] °[phone] Office °[phone] Cell ° °

## 2022-03-12 ENCOUNTER — Ambulatory Visit (HOSPITAL_COMMUNITY)
Admission: RE | Admit: 2022-03-12 | Discharge: 2022-03-12 | Disposition: A | Discharge status: Home / Self Care | Payer: Medicare Other | Source: Ambulatory Visit | Attending: Internal Medicine | Admitting: Internal Medicine

## 2022-03-12 DIAGNOSIS — I272 Pulmonary hypertension, unspecified: Secondary | ICD-10-CM | POA: Insufficient documentation

## 2022-03-12 DIAGNOSIS — I288 Other diseases of pulmonary vessels: Secondary | ICD-10-CM | POA: Diagnosis not present

## 2022-03-12 DIAGNOSIS — R0600 Dyspnea, unspecified: Secondary | ICD-10-CM | POA: Insufficient documentation

## 2022-03-12 DIAGNOSIS — R918 Other nonspecific abnormal finding of lung field: Secondary | ICD-10-CM | POA: Insufficient documentation

## 2022-03-12 DIAGNOSIS — I2510 Atherosclerotic heart disease of native coronary artery without angina pectoris: Secondary | ICD-10-CM

## 2022-03-12 MED ORDER — NITROGLYCERIN 0.4 MG SL SUBL
SUBLINGUAL_TABLET | SUBLINGUAL | Status: AC
  Filled 2022-03-12: qty 2

## 2022-03-12 MED ORDER — NITROGLYCERIN 0.4 MG SL SUBL
0.8000 mg | SUBLINGUAL_TABLET | Freq: Once | SUBLINGUAL | Status: AC
  Administered 2022-03-12: 0.8 mg via SUBLINGUAL

## 2022-03-12 MED ORDER — IOHEXOL 350 MG/ML SOLN
95.0000 mL | Freq: Once | INTRAVENOUS | Status: AC | PRN
  Administered 2022-03-12: 95 mL via INTRAVENOUS

## 2022-03-13 ENCOUNTER — Other Ambulatory Visit: Payer: Self-pay | Admitting: *Deleted

## 2022-03-13 NOTE — Patient Outreach (Signed)
  Care Coordination   03/13/2022 Name: GINELLE BAYS MRN: [id number] DOB: [birth year]   Care Coordination Outreach Attempts:  An unsuccessful telephone outreach was attempted today to offer the patient information about available care coordination services as a benefit of their health plan.   Follow Up Plan:  Additional outreach attempts will be made to offer the patient care coordination information and services.   Encounter Outcome:  No Answer  Care Coordination Interventions Activated:  No   Care Coordination Interventions:  No, not indicated    Raina Mina, RN Care Management Coordinator [place] Office [phone]

## 2022-03-16 ENCOUNTER — Encounter: Payer: Self-pay | Admitting: Internal Medicine

## 2022-03-20 ENCOUNTER — Other Ambulatory Visit: Payer: Self-pay | Admitting: *Deleted

## 2022-03-20 ENCOUNTER — Telehealth: Payer: Self-pay | Admitting: Internal Medicine

## 2022-03-20 NOTE — Patient Outreach (Signed)
  Care Coordination   03/20/2022 Name: Dorothy White MRN: [id number] DOB: 07/21/1951   Care Coordination Outreach Attempts:  A second unsuccessful outreach was attempted today to offer the patient with information about available care coordination services as a benefit of their health plan.     Follow Up Plan:  Additional outreach attempts will be made to offer the patient care coordination information and services.   Encounter Outcome:  No Answer  Care Coordination Interventions Activated:  No   Care Coordination Interventions:  No, not indicated    Raina Mina, RN Care Management Coordinator [place] Office [phone]

## 2022-03-20 NOTE — Telephone Encounter (Signed)
Left message for patient to call back  

## 2022-03-20 NOTE — Telephone Encounter (Signed)
Patient returned RN's call. 

## 2022-03-21 NOTE — Telephone Encounter (Signed)
Patient returned call.  We reviewed need to move up appointment to discuss results of CT. She has been rescheduled sooner with Dr. [name].

## 2022-04-02 ENCOUNTER — Telehealth: Payer: Self-pay

## 2022-04-02 NOTE — Patient Outreach (Signed)
  Care Coordination   04/02/2022 Name: SUJEY GUNDRY MRN: [id number] DOB: May 12, 1951   Care Coordination Outreach Attempts:  A third unsuccessful outreach was attempted today to offer the patient with information about available care coordination services as a benefit of their health plan.   Follow Up Plan:  No further outreach attempts will be made at this time. We have been unable to contact the patient to offer or enroll patient in care coordination services  Encounter Outcome:  No Answer  Care Coordination Interventions Activated:  No   Care Coordination Interventions:  No, not indicated    Daneen Schick, BSW, CDP Social Worker, Certified Dementia Practitioner Care Coordination [phone]

## 2022-04-10 ENCOUNTER — Ambulatory Visit (INDEPENDENT_AMBULATORY_CARE_PROVIDER_SITE_OTHER): Payer: Medicare Other | Admitting: Internal Medicine

## 2022-04-10 DIAGNOSIS — E785 Hyperlipidemia, unspecified: Secondary | ICD-10-CM

## 2022-04-10 DIAGNOSIS — I700 Atherosclerosis of aorta: Secondary | ICD-10-CM

## 2022-04-10 DIAGNOSIS — I471 Supraventricular tachycardia: Secondary | ICD-10-CM

## 2022-04-10 DIAGNOSIS — R0600 Dyspnea, unspecified: Secondary | ICD-10-CM

## 2022-04-10 DIAGNOSIS — Z720 Tobacco use: Secondary | ICD-10-CM

## 2022-04-10 NOTE — Progress Notes (Signed)
Cardiology Office Note:    Date:  04/10/2022   ID:  Dorothy White, DOB Dec 18, 1950, MRN [id number]  PCP:  Jonathon Jordan, MD   [facility] Providers Cardiologist:  Lenna Sciara, MD Referring MD: Jonathon Jordan, MD   Chief Complaint/Reason for Referral: Palpitations and dyspnea  PATIENT DID NOT APPEAR FOR APPOINTMENT   ASSESSMENT:    1. Dyspnea, unspecified type   2. Aortic atherosclerosis ([place])   3. Hyperlipidemia LDL goal <70   4. Tobacco abuse   5. Supraventricular tachycardia ([place])      PLAN:    In order of problems listed above: 1.  Dyspnea: CTA was reassuring.  Echocardiogram showed mildly elevated PA systolic pressures.  Consider RHC and pulmonary evaluation. 2.  Aortic atherosclerosis: Continue aspirin, Lipitor, and blood pressure control. 3.  Hyperlipidemia: Check LP(a), LFTs, and lipid panel today. 4.  Tobacco abuse: Stressed need for abstinence.  Carotid Dopplers were reassuring.  Will obtain abdominal ultrasound to screen for abdominal aneurysm. 5.  Supraventricular tachycardia: Seen on monitor currently on diltiazem. 6.  Palpitations: We will obtain monitor.     Dispo:  No follow-ups on file.      Medication Adjustments/Labs and Tests Ordered: Current medicines are reviewed at length with the patient today.  Concerns regarding medicines are outlined above.  The following changes have been made:     Labs/tests ordered: No orders of the defined types were placed in this encounter.   Medication Changes: No orders of the defined types were placed in this encounter.    Current medicines are reviewed at length with the patient today.  The patient does not have concerns regarding medicines.   History of Present Illness:    FOCUSED PROBLEM LIST:   1.  CTA 2023 with calcium score of 0 with aortic atherosclerosis 2.  Hyperlipidemia 3.  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  Tobacco abuse  May 2023: Patient seen for initial consultation regarding palpitations and dyspnea.  She was referred for coronary CTA, echocardiogram, event monitor.  Coronary CTA showed calcium score of 0 with no obstructive coronary artery disease however did show aortic atherosclerosis.  Monitor showed episodes of supraventricular tachycardia.  She was started on diltiazem 120 mg daily.  Echocardiogram showed normal LV function and RV function however mildly elevated PA systolic pressure.  Carotid Dopplers were also negative for obstructive disease.  Today:    Current Medications: No outpatient medications have been marked as taking for the 04/10/22 encounter (Appointment) with Early Osmond, MD.     Allergies:    Opana [oxymorphone hcl]   Social History:   Social History   Tobacco Use   Smoking status: Some Days    Packs/day: 0.25    Years: 34.00    Total pack years: 8.50    Types: Cigarettes   Smokeless tobacco: Never  Vaping Use   Vaping Use: Never used  Substance Use Topics   Alcohol use: Not Currently    Alcohol/week: 1.0 standard drink of alcohol    Types: 1 Standard drinks or equivalent per week    Comment: occ    Drug use: No    Comment: Denies any drug use 02/19/18     Family Hx: Family History  Problem Relation Age of Onset   Lung cancer Mother 102   Migraines Mother    Heart attack Father        Vague  Hypertension Brother    Esophageal cancer Brother    Diabetes Paternal Grandmother        questionable   Colon cancer Neg Hx    Kidney disease Neg Hx    Liver disease Neg Hx      Review of Systems:   Please see the history of present illness.    All other systems reviewed and are negative.     EKGs/Labs/Other Test Reviewed:    EKG:  EKG performed April 2023 that I personally reviewed demonstrates sinus rhythm with nonspecific ST and T wave changes; EKG performed today that I personally reviewed demonstrates sinus  rhythm with nonspecific ST and T wave changes.  Prior CV studies:   Monitor 2023: 19 Supraventricular Tachycardia runs occurred, the run with the fastest interval lasting 9 beats with a max rate of 200 bpm, the  longest lasting 10.1 secs with an avg rate of 142 bpm. Supraventricular Tachycardia was detected within +/- 45 seconds of symptomatic patient event(s).    Isolated SVEs were rare (<1.0%), SVE Couplets were rare (<1.0%), and SVE Triplets were rare (<1.0%).    Isolated VEs were rare (<1.0%), and no VE Couplets or VE Triplets were present.    Patient triggered events corresponded with sinus rhythm and supraventricular tachycardia.   No atrial fibrillation, sustained ventricular tachyarrhythmias, or bradyarrhythmias were detected.  Carotid ultrasound 2023: Right Carotid: There is no evidence of stenosis in the right ICA.  Bead-like appearance in the distal segment with mildly elevated  velocities suggestive for fibromuscular dysplasia.   Left Carotid: There is no evidence of stenosis in the left ICA. Bead-like appearance in the distal segment with elevated velocities suggestive for fibromuscular dysplasia.   Vertebrals:  Bilateral vertebral arteries demonstrate antegrade flow.  Subclavians: Normal flow hemodynamics were seen in bilateral subclavian arteries.   TTE 2023  1. Left ventricular ejection fraction, by estimation, is 60 to 65%. The  left ventricle has normal function. The left ventricle has no regional  wall motion abnormalities. Left ventricular diastolic parameters are  consistent with Grade I diastolic  dysfunction (impaired relaxation).   2. Right ventricular systolic function is normal. The right ventricular  size is normal. There is mildly elevated pulmonary artery systolic  pressure. The estimated right ventricular systolic pressure is 21.1 mmHg.   3. The mitral valve is normal in structure. No evidence of mitral valve  regurgitation. No evidence of mitral  stenosis.   4. The aortic valve is tricuspid. Aortic valve regurgitation is not  visualized. No aortic stenosis is present.   5. The inferior vena cava is normal in size with greater than 50%  respiratory variability, suggesting right atrial pressure of 3 mmHg.  Coronary angiography 2017 with mild mid LAD disease  Other studies Reviewed: Review of the additional studies/records demonstrates: None relevant  Recent Labs: 05/31/2021: B Natriuretic Peptide 166.5 01/21/2022: ALT 10 01/22/2022: Hemoglobin 9.8; Platelets 240 01/23/2022: BUN 11; Creatinine, Ser 0.64; Magnesium 1.7; Potassium 4.3; Sodium 144   Recent Lipid Panel Lab Results  Component Value Date/Time   CHOL 178 02/18/2017 07:54 PM   CHOL 243 (H) 08/15/2013 08:39 AM   TRIG 139 02/18/2017 07:54 PM   HDL 48 02/18/2017 07:54 PM   HDL 55 08/15/2013 08:39 AM   LDLCALC 102 (H) 02/18/2017 07:54 PM   LDLCALC 142 (H) 08/15/2013 08:39 AM    Risk Assessment/Calculations:           Physical Exam:      Signed, Early Osmond,  MD  04/10/2022 6:47 AM    [facility] [address], [place], [place]  [address] Phone: [phone]; Fax: [phone]   Note:  This document was prepared using Dragon voice recognition software and may include unintentional dictation errors.

## 2022-04-12 NOTE — Progress Notes (Deleted)
Office Visit    Patient Name: [name] Date of Encounter: 04/12/2022  Primary Care Provider:  Jonathon Jordan, MD Primary Cardiologist:  Early Osmond, MD Primary Electrophysiologist: None  Chief Complaint    Dorothy White is a 71 y.o. female with PMH of HLD, TIA (2014), NICM, CAD (nonobstructive 2017), tobacco abuse, palpitations who presents today for complaint of dyspnea.  Past Medical History    Past Medical History:  Diagnosis Date   Anemia    ?   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 25% mLAD, otherwise no sig disease 01/2016 cath.   NICM (nonischemic cardiomyopathy) ([place])    a. EF 40-45% by echo 02/5630 at time of complicated migraine/neuro sx, 55-65% at time of cath 01/2016   Stroke [facility]) 06/2013   "mini" stroke , ?possibly hemaplegic migraine per pt   Past Surgical History:  Procedure Laterality Date   ANTERIOR APPROACH HEMI HIP ARTHROPLASTY Left 04/01/2017   Procedure: LEFT DIRECT ANTERIOR TOTAL HIP REPLACEMENT;  Surgeon: Leandrew Koyanagi, MD;  Location: [place];  Service: Orthopedics;  Laterality: Left;  LEFT DIRECT ANTERIOR TOTAL HIP REPLACEMENT   AUGMENTATION MAMMAPLASTY  1980   BASAL CELL CARCINOMA EXCISION     "tip of my nose"   BUNIONECTOMY Bilateral 10/2003   CARDIAC CATHETERIZATION N/A 02/20/2016   Procedure: Left Heart Cath and Coronary Angiography;  Surgeon: Jettie Booze, MD;  Location: [place] [facility];   Service: Cardiovascular;  Laterality: N/A;   COLONOSCOPY     DILATION AND CURETTAGE OF UTERUS     FRACTURE SURGERY     IR GENERIC HISTORICAL  03/18/2016   IR SINUS/FIST TUBE CHK-NON GI 03/18/2016 Aletta Edouard, MD [facility]   LAPAROSCOPIC SIGMOID COLECTOMY N/A 04/25/2016   Procedure: LAPAROSCOPIC SIGMOID COLECTOMY;  Surgeon: Stark Klein, MD;  Location: [place];  Service: General;  Laterality: N/A;   OOPHORECTOMY Bilateral ~ 1999   ORIF HUMERUS FRACTURE Right 02/15/2015   Procedure: OPEN REDUCTION INTERNAL FIXATION (ORIF) PROXIMAL HUMERUS FRACTURE;  Surgeon: Netta Cedars, MD;  Location: [place];  Service: Orthopedics;  Laterality: Right;   ORIF HUMERUS FRACTURE Left 04/04/2020   ORIF HUMERUS FRACTURE Left 04/04/2020   Procedure: OPEN REDUCTION INTERNAL FIXATION (ORIF) PROXIMAL HUMERUS FRACTURE;  Surgeon: Iran Planas, MD;  Location: [place];  Service: Orthopedics;  Laterality: Left;  regional block   ORIF ULNAR FRACTURE Left 04/04/2020   ORIF ULNAR FRACTURE Left 04/04/2020   Procedure: OPEN REDUCTION INTERNAL FIXATION (ORIF) ULNAR FRACTURE;  Surgeon: Iran Planas, MD;  Location: [place];  Service: Orthopedics;  Laterality: Left;  with regional block   [place] Right 08/11/2020   Procedure: TOTAL HIP ARTHROPLASTY ANTERIOR APPROACH;  Surgeon: Rod Can, MD;  Location: [facility];  Service: Orthopedics;  Laterality:  Right;   TUBAL LIGATION  ~ [place]  ~ 1998    Allergies  Allergies  Allergen Reactions   Opana [Oxymorphone Hcl] Other (See Comments)    hallucinations    History of Present Illness    Dorothy White  is a 71 year old female with the above mention past medical history who presents today for complaint of dyspnea and palpitations.  She was initially seen by Dr. [name] in 11/2015 for palpitations and reduced EF noted to be 40-45%.  She wore a 30-day event monitor that showed no atrial fibrillation or  arrhythmias.  She was seen in follow-up 01/2016 with complaint of orthostatic hypotension.  She also reported SOB and substernal chest pain.  She underwent nuclear stress test that showed medium defect of moderate severity with T wave inversions in inferolateral leads.  She had LHC performed that revealed nonobstructive CAD and EF of 55-65%.  She was seen in 2018 for complaint of chest pain and dyspnea on exertion.  She endorsed twinge in her chest and was noted to have abnormal EKG with nonspecific ST changes and subtle sagging in lead II, 3, aVF with flattened T waves in V4-V6.  Troponins were negative. Carotid U/S completed and revealed 40-59% stenosis in distal left internal carotid artery, right carotid artery 1-39% stenosis.  2D echo completed noting EF 60-65% with mild basal septal hypertrophy and no RWMA.  Patient was lost to follow-up until seen 11/2021 by Dr. [name] for complaint of palpitations and dyspnea.  Patient endorsed shortness of breath with exertion when bending over.  Patient also noted to have palpitations and peripheral edema that responded to Lasix.  She was referred for cardiac CTA that was completed 03/12/2022 with mildly elevated PA pressures and nonobstructive CAD.  Exam did note aortic atherosclerosis.  Event monitor was also placed that revealed 19 SVT runs with no atrial fibrillation or sustained VT ventricular tachycardia.  Patient was seen in follow-up on 04/10/2022 and was started on Cardizem 120 mg.  2D echo completed in 12/2021 showed normal RV function with mild PA systolic pressure.  Since last being seen in the office patient reports***.  Patient denies chest pain, palpitations, dyspnea, PND, orthopnea, nausea, vomiting, dizziness, syncope, edema, weight gain, or early satiety.   ***Notes: -Recent cardiac CTA with calcium score 0 pulmonary hypertension noted with mildly elevated PA pressures -Palpitations, dyspnea Home Medications    Current Outpatient Medications   Medication Sig Dispense Refill   acetaminophen (TYLENOL) 500 MG tablet Take 1,000 mg by mouth every 8 (eight) hours as needed for headache (pain).     aspirin EC 81 MG tablet Take 1 tablet (81 mg total) by mouth daily. Swallow whole. (Patient taking differently: Take 81 mg by mouth at bedtime. Swallow whole.) 90 tablet 3   atorvastatin (LIPITOR) 40 MG tablet Take 1 tablet (40 mg total) by mouth at bedtime.     Cholecalciferol (VITAMIN D3) 50 MCG (2000 UT) capsule Take 2,000 Units by mouth every morning. 1 capsule     diltiazem (CARDIZEM CD) 120 MG 24 hr capsule Take 1 capsule (120 mg total) by mouth at bedtime. 90 capsule 3   docusate sodium (COLACE) 100 MG capsule Take 1 capsule (100 mg total) by mouth daily as needed for mild constipation.     doxycycline (VIBRAMYCIN) 100 MG capsule Take 1 capsule (100 mg total) by mouth 2 (two) times daily. 14 capsule 0   ferrous sulfate 325 (65 FE) MG tablet Take  1 tablet (325 mg total) by mouth daily with breakfast. 30 tablet 0   FLUoxetine (PROZAC) 40 MG capsule Take 80 mg by mouth every morning.  11   furosemide (LASIX) 20 MG tablet Take 20 mg by mouth daily as needed for fluid or edema (ankle swelling).     gabapentin (NEURONTIN) 100 MG capsule Take 200 mg by mouth 3 (three) times daily as needed (nerve pain).     metoprolol tartrate (LOPRESSOR) 100 MG tablet Take one tablet by mouth 2 hours before CT scan 1 tablet 0   mirtazapine (REMERON) 15 MG tablet TAKE 1 TABLET BY MOUTH EVERYDAY AT BEDTIME (Patient taking differently: Take 15 mg by mouth at bedtime.) 15 tablet 0   omeprazole (PRILOSEC) 20 MG capsule Take 20 mg by mouth every morning.     ondansetron (ZOFRAN) 4 MG tablet Take 4 mg by mouth daily as needed for nausea or vomiting.     oxyCODONE-acetaminophen (PERCOCET) 10-325 MG tablet Take 1 tablet by mouth every 4 (four) hours as needed for pain.     SUMAtriptan (IMITREX) 100 MG tablet Take 50-100 mg by mouth daily as needed for migraine.      traZODone (DESYREL) 100 MG tablet Take 100 mg by mouth at bedtime.     vitamin B-12 (CYANOCOBALAMIN) 1000 MCG tablet Take 1,000 mcg by mouth daily. 1 tablet     No current facility-administered medications for this visit.     Review of Systems  Please see the history of present illness.    (+)*** (+)***  All other systems reviewed and are otherwise negative except as noted above.  Physical Exam    Wt Readings from Last 3 Encounters:  01/20/22 146 lb 2.6 oz (66.3 kg)  01/18/22 135 lb (61.2 kg)  12/16/21 143 lb 9.6 oz (65.1 kg)   PQ:ZRAQT were no vitals filed for this visit.,There is no height or weight on file to calculate BMI.  Constitutional:      Appearance: Healthy appearance. Not in distress.  Neck:     Vascular: JVD normal.  Pulmonary:     Effort: Pulmonary effort is normal.     Breath sounds: No wheezing. No rales. Diminished in the bases Cardiovascular:     Normal rate. Regular rhythm. Normal S1. Normal S2.      Murmurs: There is no murmur.  Edema:    Peripheral edema absent.  Abdominal:     Palpations: Abdomen is soft non tender. There is no hepatomegaly.  Skin:    General: Skin is warm and dry.  Neurological:     General: No focal deficit present.     Mental Status: Alert and oriented to person, place and time.     Cranial Nerves: Cranial nerves are intact.  EKG/LABS/Other Studies Reviewed    ECG personally reviewed by me today - ***  Risk Assessment/Calculations:   {Does this patient have ATRIAL FIBRILLATION?[phone]}        Lab Results  Component Value Date   WBC 4.7 01/22/2022   HGB 9.8 (L) 01/22/2022   HCT 33.7 (L) 01/22/2022   MCV 75.9 (L) 01/22/2022   PLT 240 01/22/2022   Lab Results  Component Value Date   CREATININE 0.64 01/23/2022   BUN 11 01/23/2022   NA 144 01/23/2022   K 4.3 01/23/2022   CL 114 (H) 01/23/2022   CO2 25 01/23/2022   Lab Results  Component Value Date   ALT 10 01/21/2022   AST 11 (L) 01/21/2022   ALKPHOS  94  01/21/2022   BILITOT 1.0 01/21/2022   Lab Results  Component Value Date   CHOL 178 02/18/2017   HDL 48 02/18/2017   LDLCALC 102 (H) 02/18/2017   TRIG 139 02/18/2017   CHOLHDL 3.7 02/18/2017    Lab Results  Component Value Date   HGBA1C 4.8 02/19/2017    Assessment & Plan    1.  Supraventricular tachycardia: -Recent ZIO monitor revealed SVT -Patient currently on Cardizem -Today patient reports***  2.  Nonobstructive coronary artery disease: -Coronary CTA completed showing nonobstructive   3.  Hyperlipidemia: -Continue atorvastatin 40 mg daily   4.  Dyspnea on exertion: -Patient reports*** -Pulmonary referral  5.  Aortic atherosclerosis: -Noted on most recent cardiac CTA -Continue ASA and Lipitor with strict BP control      Disposition: Follow-up with Early Osmond, MD or APP in *** months {Are you ordering a CV Procedure (e.g. stress test, cath, DCCV, TEE, etc)?   Press F2        :[id number]}   Medication Adjustments/Labs and Tests Ordered: Current medicines are reviewed at length with the patient today.  Concerns regarding medicines are outlined above.   Signed, Mable Fill, Marissa Nestle, NP 04/12/2022, 1:10 PM [place]

## 2022-04-14 ENCOUNTER — Ambulatory Visit: Payer: Medicare Other | Admitting: Nurse Practitioner

## 2022-04-14 DIAGNOSIS — I471 Supraventricular tachycardia: Secondary | ICD-10-CM

## 2022-04-16 ENCOUNTER — Ambulatory Visit (INDEPENDENT_AMBULATORY_CARE_PROVIDER_SITE_OTHER): Payer: Medicare Other | Admitting: Pulmonary Disease

## 2022-04-16 ENCOUNTER — Encounter: Payer: Self-pay | Admitting: Pulmonary Disease

## 2022-04-16 VITALS — BP 136/76 | HR 68 | Temp 98.2°F | Ht 65.0 in | Wt 132.0 lb

## 2022-04-16 DIAGNOSIS — J9811 Atelectasis: Secondary | ICD-10-CM

## 2022-04-16 NOTE — Patient Instructions (Signed)
Nice to meet you  Nothing to worry about the atelectasis is not concerning  Return to clinic as needed, send a message if I can help in any way

## 2022-04-16 NOTE — Progress Notes (Signed)
$'@Patient'i$  ID: Dorothy White, female    DOB: 04/05/1951, 71 y.o.   MRN: [id number]  Chief complaint: concern on CT images  Referring provider: Saintclair Halsted, FNP  HPI:   71 y.o. woman whom we are seeing in consultation for evaluation of atelectasis.  Note from referring provider reviewed.  Patient was in usual state of health.  She has some shortness of breath.  Really she describes difficulty breathing through her nose.  Is related to fractures in the past.  Potential upcoming surgery to fix this.  She was has anatomic nasal obstruction.  Her activity is limited due to chronic joint issues.  He states she had both hips replaced.  She still feels little unsteady of gait after this.  Ambulates with the assistance of a cane.  She was seen by cardiology 02/2022 with complaints of palpitations.  A CTA coronary scan was ordered.  On my review interpretation this shows small bilateral linear infiltrates consistent with likely atelectasis with some concern for possible focal scarring possibly related to prior infection in the right lower lobe with some air bronchograms due to atelectasis.  No evidence of mass, nodule, or concerning features.  This was shared with the patient.  PMH: Seasonal allergies, chronic headaches Surgical history: Colon surgery, hysterectomy, breast augmentation Family history: Reviewed, no first-degree relatives with respiratory issues Social history: Current smoker, 6 to 7 cigarettes a day, lives in [place] / Pulmonary Flowsheets:   ACT:      No data to display          MMRC:     No data to display          Epworth:      No data to display          Tests:   FENO:  No results found for: "NITRICOXIDE"  PFT:     No data to display          WALK:      No data to display          Imaging: Personally reviewed and as per EMR discussion this note No results found.  Lab Results: Personally reviewed  CBC     Component Value Date/Time   WBC 4.7 01/22/2022 0539   RBC 4.37 01/22/2022 0539   RBC 4.44 01/22/2022 0539   HGB 9.8 (L) 01/22/2022 0539   HCT 33.7 (L) 01/22/2022 0539   PLT 240 01/22/2022 0539   MCV 75.9 (L) 01/22/2022 0539   MCH 22.1 (L) 01/22/2022 0539   MCHC 29.1 (L) 01/22/2022 0539   RDW 20.1 (H) 01/22/2022 0539   LYMPHSABS 1.2 01/20/2022 1011   MONOABS 0.5 01/20/2022 1011   EOSABS 0.1 01/20/2022 1011   BASOSABS 0.0 01/20/2022 1011    BMET    Component Value Date/Time   NA 144 01/23/2022 0510   NA 141 12/16/2021 1433   K 4.3 01/23/2022 0510   CL 114 (H) 01/23/2022 0510   CO2 25 01/23/2022 0510   GLUCOSE 92 01/23/2022 0510   BUN 11 01/23/2022 0510   BUN 15 12/16/2021 1433   CREATININE 0.64 01/23/2022 0510   CREATININE 0.95 02/15/2016 1536   CALCIUM 8.4 (L) 01/23/2022 0510   CALCIUM 9.4 01/13/2007 2356   GFRNONAA >60 01/23/2022 0510   GFRAA >60 04/04/2020 1212    BNP    Component Value Date/Time   BNP 166.5 (H) 05/31/2021 1502    ProBNP No results found for: "PROBNP"  Specialty Problems  Pulmonary Problems   Allergic rhinitis    Qualifier: Diagnosis of  By: Jenny Reichmann MD, Hunt Oris       Dyspnea on exertion    Allergies  Allergen Reactions   Opana [Oxymorphone Hcl] Other (See Comments)    hallucinations    Immunization History  Administered Date(s) Administered   Influenza,inj,quad, With Preservative 04/27/2017   Influenza-Unspecified 06/09/2019   PFIZER(Purple Top)SARS-COV-2 Vaccination 10/13/2019, 11/07/2019   PPD Test 04/07/2017   Pfizer Covid-19 Vaccine Bivalent Booster 7yr & up 05/24/2021   Pneumococcal Conjugate PCV 7 06/18/2017   Pneumococcal-Unspecified 06/18/2017   Td 07/28/2006, 01/18/2022, 01/18/2022   Td (Adult), 2 Lf Tetanus Toxid, Preservative Free 07/28/2006   Tdap 02/08/2013    Past Medical History:  Diagnosis Date   Anemia    ?   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 25% mLAD, otherwise no sig disease 01/2016 cath.   [place]    a. EF 40-45% by echo 54/7829at time of complicated migraine/neuro sx, 55-65% at time of cath 01/2016   Stroke ([facility] 06/2013   "mini" stroke , ?possibly hemaplegic migraine per pt    Tobacco History: Social History   Tobacco Use  Smoking Status Some Days   Packs/day: 0.25   Years: 34.00   Total pack years: 8.50   Types: Cigarettes  Smokeless Tobacco Never   Ready to quit: Not Answered Counseling given: Not Answered   Continue to not smoke  Outpatient Encounter Medications as of 04/16/2022  Medication Sig   acetaminophen (TYLENOL) 500 MG tablet Take 1,000 mg by mouth every 8 (eight) hours as needed for headache (pain).   aspirin EC 81 MG tablet Take 1 tablet (81 mg total) by mouth daily. Swallow whole. (Patient taking differently: Take 81 mg by mouth at bedtime. Swallow whole.)   atorvastatin (LIPITOR) 40 MG tablet Take 1 tablet (40 mg total) by mouth at bedtime.   Cholecalciferol (VITAMIN D3) 50 MCG (2000 UT) capsule Take 2,000 Units by mouth every morning. 1 capsule   diltiazem (CARDIZEM CD) 120 MG 24 hr capsule Take 1 capsule (120 mg total) by mouth at bedtime.   docusate sodium (COLACE) 100 MG capsule Take 1 capsule (100 mg total) by mouth daily as needed for mild constipation.   doxycycline (VIBRAMYCIN) 100 MG capsule Take 1 capsule (100 mg total) by mouth 2 (two) times daily.   ferrous sulfate 325  (65 FE) MG tablet Take 1 tablet (325 mg total) by mouth daily with breakfast.   FLUoxetine (PROZAC) 40 MG capsule Take 80 mg by mouth every morning.   furosemide (LASIX) 20 MG tablet Take 20 mg by mouth daily as needed for fluid or edema (ankle swelling).   gabapentin (NEURONTIN) 100 MG capsule Take 200 mg by mouth 3 (three) times daily as needed (nerve pain).   metoprolol  tartrate (LOPRESSOR) 100 MG tablet Take one tablet by mouth 2 hours before CT scan   mirtazapine (REMERON) 15 MG tablet TAKE 1 TABLET BY MOUTH EVERYDAY AT BEDTIME (Patient taking differently: Take 15 mg by mouth at bedtime.)   omeprazole (PRILOSEC) 20 MG capsule Take 20 mg by mouth every morning.   ondansetron (ZOFRAN) 4 MG tablet Take 4 mg by mouth daily as needed for nausea or vomiting.   oxyCODONE-acetaminophen (PERCOCET) 10-325 MG tablet Take 1 tablet by mouth every 4 (four) hours as needed for pain.   SUMAtriptan (IMITREX) 100 MG tablet Take 50-100 mg by mouth daily as needed for migraine.   traZODone (DESYREL) 100 MG tablet Take 100 mg by mouth at bedtime.   vitamin B-12 (CYANOCOBALAMIN) 1000 MCG tablet Take 1,000 mcg by mouth daily. 1 tablet   No facility-administered encounter medications on file as of 04/16/2022.     Review of Systems  Review of Systems  No chest pain with exertion.  No orthopnea or PND.  Comprehensive review of systems otherwise negative. Physical Exam  BP 136/76 (BP Location: Left Arm, Patient Position: Sitting, Cuff Size: Normal)   Pulse 68   Temp 98.2 F (36.8 C) (Oral)   Ht '5\' 5"'$  (1.651 m)   Wt 132 lb (59.9 kg)   SpO2 95%   BMI 21.97 kg/m   Wt Readings from Last 5 Encounters:  04/16/22 132 lb (59.9 kg)  01/20/22 146 lb 2.6 oz (66.3 kg)  01/18/22 135 lb (61.2 kg)  12/16/21 143 lb 9.6 oz (65.1 kg)  11/22/21 140 lb (63.5 kg)    BMI Readings from Last 5 Encounters:  04/16/22 21.97 kg/m  01/20/22 24.32 kg/m  01/18/22 26.37 kg/m  12/16/21 23.90 kg/m  11/22/21 23.30 kg/m      Physical Exam General: Sitting in chair, no acute distress Eyes: EOMI, no icterus Neck: Supple, JVP Pulmonary: Clear, normal work of breathing Cardiovascular: Warm, no edema, regular rate and rhythm Abdomen: Nondistended, bowel sounds present MSK: No synovitis, no joint effusion Neuro: Slow gait, no focal weakness Psych: Normal mood, full affect   Assessment & Plan:   Bilateral atelectasis: Small, dependent.  Suspect related to positional nature, lying supine.  Poss related to lack of activity, exercise, deep breathing.  No concerning finding on imaging.  No further follow-up needed.  Shortness of breath: She feels mostly limited by inability to take a breath through her nose.  Reassess after potential nasal surgery in the coming months.   Return if symptoms worsen or fail to improve.   Lanier Clam, MD 04/16/2022

## 2022-04-17 NOTE — Progress Notes (Signed)
Office Visit    Patient Name: Dorothy White Date of Encounter: 04/17/2022  Primary Care Provider:  Jonathon Jordan, MD Primary Cardiologist:  Early Osmond, MD Primary Electrophysiologist: None  Chief Complaint    Dorothy White is a 71 y.o. female with PMH of HLD, TIA (2014), NICM, CAD (nonobstructive 2017), tobacco abuse, palpitations who presents today for complaint of dyspnea.    Past Medical History    Past Medical History:  Diagnosis Date   Anemia    ?   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 25% mLAD, otherwise no sig disease 01/2016 cath.   NICM (nonischemic cardiomyopathy) ([place])    a. EF 40-45% by echo 08/5364 at time of complicated migraine/neuro sx, 55-65% at time of cath 01/2016   Stroke [facility]) 06/2013   "mini" stroke , ?possibly hemaplegic migraine per pt   Past Surgical History:  Procedure Laterality Date   ANTERIOR APPROACH HEMI HIP ARTHROPLASTY Left 04/01/2017   Procedure: LEFT DIRECT ANTERIOR TOTAL HIP REPLACEMENT;  Surgeon: Leandrew Koyanagi, MD;  Location: [facility];  Service: Orthopedics;  Laterality: Left;  LEFT DIRECT ANTERIOR TOTAL HIP REPLACEMENT   AUGMENTATION MAMMAPLASTY  1980   BASAL CELL CARCINOMA EXCISION     "tip of my nose"   BUNIONECTOMY Bilateral 10/2003   CARDIAC CATHETERIZATION N/A 02/20/2016   Procedure: Left Heart Cath and Coronary Angiography;  Surgeon: Jettie Booze, MD;  Location: [facility];   Service: Cardiovascular;  Laterality: N/A;   COLONOSCOPY     DILATION AND CURETTAGE OF UTERUS     FRACTURE SURGERY     IR GENERIC HISTORICAL  03/18/2016   IR SINUS/FIST TUBE CHK-NON GI 03/18/2016 Aletta Edouard, MD [facility]   LAPAROSCOPIC SIGMOID COLECTOMY N/A 04/25/2016   Procedure: LAPAROSCOPIC SIGMOID COLECTOMY;  Surgeon: Stark Klein, MD;  Location: [place];  Service: General;  Laterality: N/A;   OOPHORECTOMY Bilateral ~ 1999   ORIF HUMERUS FRACTURE Right 02/15/2015   Procedure: OPEN REDUCTION INTERNAL FIXATION (ORIF) PROXIMAL HUMERUS FRACTURE;  Surgeon: Netta Cedars, MD;  Location: [place];  Service: Orthopedics;  Laterality: Right;   ORIF HUMERUS FRACTURE Left 04/04/2020   ORIF HUMERUS FRACTURE Left 04/04/2020   Procedure: OPEN REDUCTION INTERNAL FIXATION (ORIF) PROXIMAL HUMERUS FRACTURE;  Surgeon: Iran Planas, MD;  Location: [place];  Service: Orthopedics;  Laterality: Left;  regional block   ORIF ULNAR FRACTURE Left 04/04/2020   ORIF ULNAR FRACTURE Left 04/04/2020   Procedure: OPEN REDUCTION INTERNAL FIXATION (ORIF) ULNAR FRACTURE;  Surgeon: Iran Planas, MD;  Location: [place];  Service: Orthopedics;  Laterality: Left;  with regional block   Sac Right 08/11/2020   Procedure: TOTAL HIP ARTHROPLASTY ANTERIOR APPROACH;  Surgeon: Rod Can, MD;  Location: [facility];  Service: Orthopedics;  Laterality: Right;   TUBAL LIGATION  ~ 1983   VAGINAL HYSTERECTOMY  ~ 1998    Allergies  Allergies  Allergen Reactions   Opana [Oxymorphone Hcl] Other (See Comments)    hallucinations    History of Present Illness    Dorothy White  is a 71 year old female with the above mention past medical history who presents today for complaint of dyspnea and palpitations.  She was initially seen by Dr. [name] in 11/2015 for palpitations and reduced EF noted to be 40-45%.  She wore a 30-day event monitor that showed no atrial fibrillation or  arrhythmias.  She was seen in follow-up 01/2016 with complaint of orthostatic hypotension.  She also reported SOB and substernal chest pain.  She underwent nuclear stress test that showed medium defect of moderate severity with T wave inversions in inferolateral leads.  She had LHC performed that revealed nonobstructive CAD and EF of 55-65%.  She was seen in 2018 for complaint of chest pain and dyspnea on exertion.  She endorsed twinge in her chest and was noted to have abnormal EKG with nonspecific ST changes and subtle sagging in lead II, 3, aVF with flattened T waves in V4-V6.  Troponins were negative. Carotid U/S completed and revealed 40-59% stenosis in distal left internal carotid artery, right carotid artery 1-39% stenosis.  2D echo completed noting EF 60-65% with mild basal septal hypertrophy and no RWMA.   Patient was lost to follow-up until seen 11/2021 by Dr. [name] for complaint of palpitations and dyspnea.  Patient endorsed shortness of breath with exertion when bending over.  Patient also noted to have palpitations and peripheral edema that responded to Lasix.  She was referred for cardiac CTA that was completed 03/12/2022 with mildly elevated PA pressures and nonobstructive CAD.  Exam did note aortic atherosclerosis.  Event monitor was also placed that revealed 19 SVT runs with no atrial fibrillation or sustained VT ventricular tachycardia.  Patient was seen in follow-up on 04/10/2022 and was started on Cardizem 120 mg.  2D echo completed in 12/2021 showed normal RV function with mild PA systolic pressure.   [name] presents today for follow-up alone.  Since last being seen in the office patient reports that she is still having sensation of palpitations most prominent in the morning upon awakening.  She denies any dizziness or chest pain with these palpitations. She is compliant with her current medications.  She is euvolemic on examination and volume status has been maintained with Lasix.  She is  in the process of having surgery for deviated septum but reports her breathing is much better today.  She is reporting increased amount of anxiety and I advised her to follow-up with PCP regarding possible medication changes.  Patient denies chest pain, palpitations, dyspnea, PND, orthopnea, nausea, vomiting, dizziness, syncope, edema, weight gain, or early satiety.  Home Medications    Current Outpatient Medications  Medication Sig Dispense Refill   acetaminophen (TYLENOL) 500 MG tablet Take 1,000 mg by mouth every 8 (eight) hours as needed for headache (pain).     aspirin EC 81 MG tablet Take 1 tablet (81 mg total) by mouth daily. Swallow whole. (Patient taking differently: Take 81 mg by mouth at bedtime. Swallow whole.) 90 tablet 3   atorvastatin (LIPITOR) 40 MG tablet Take 1 tablet (40 mg total) by mouth at bedtime.     Cholecalciferol (VITAMIN D3) 50 MCG (2000 UT) capsule Take 2,000 Units by mouth every morning. 1 capsule  diltiazem (CARDIZEM CD) 120 MG 24 hr capsule Take 1 capsule (120 mg total) by mouth at bedtime. 90 capsule 3   docusate sodium (COLACE) 100 MG capsule Take 1 capsule (100 mg total) by mouth daily as needed for mild constipation.     doxycycline (VIBRAMYCIN) 100 MG capsule Take 1 capsule (100 mg total) by mouth 2 (two) times daily. 14 capsule 0   ferrous sulfate 325 (65 FE) MG tablet Take 1 tablet (325 mg total) by mouth daily with breakfast. 30 tablet 0   FLUoxetine (PROZAC) 40 MG capsule Take 80 mg by mouth every morning.  11   furosemide (LASIX) 20 MG tablet Take 20 mg by mouth daily as needed for fluid or edema (ankle swelling).     gabapentin (NEURONTIN) 100 MG capsule Take 200 mg by mouth 3 (three) times daily as needed (nerve pain).     metoprolol tartrate (LOPRESSOR) 100 MG tablet Take one tablet by mouth 2 hours before CT scan 1 tablet 0   mirtazapine (REMERON) 15 MG tablet TAKE 1 TABLET BY MOUTH EVERYDAY AT BEDTIME (Patient taking differently: Take 15 mg by mouth  at bedtime.) 15 tablet 0   omeprazole (PRILOSEC) 20 MG capsule Take 20 mg by mouth every morning.     ondansetron (ZOFRAN) 4 MG tablet Take 4 mg by mouth daily as needed for nausea or vomiting.     oxyCODONE-acetaminophen (PERCOCET) 10-325 MG tablet Take 1 tablet by mouth every 4 (four) hours as needed for pain.     SUMAtriptan (IMITREX) 100 MG tablet Take 50-100 mg by mouth daily as needed for migraine.     traZODone (DESYREL) 100 MG tablet Take 100 mg by mouth at bedtime.     vitamin B-12 (CYANOCOBALAMIN) 1000 MCG tablet Take 1,000 mcg by mouth daily. 1 tablet     No current facility-administered medications for this visit.     Review of Systems  Please see the history of present illness.    (+) Anxiety (+) Palpitations  All other systems reviewed and are otherwise negative except as noted above.  Physical Exam    Wt Readings from Last 3 Encounters:  04/16/22 132 lb (59.9 kg)  01/20/22 146 lb 2.6 oz (66.3 kg)  01/18/22 135 lb (61.2 kg)   WP:[id number] were no vitals filed for this visit.,There is no height or weight on file to calculate BMI.  Constitutional:      Appearance: Healthy appearance. Not in distress.  Neck:     Vascular: JVD normal.  Pulmonary:     Effort: Pulmonary effort is normal.     Breath sounds: No wheezing. No rales. Diminished in the bases Cardiovascular:     Normal rate. Regular rhythm. Normal S1. Normal S2.      Murmurs: There is no murmur.  Edema:    Peripheral edema absent.  Abdominal:     Palpations: Abdomen is soft non tender. There is no hepatomegaly.  Skin:    General: Skin is warm and dry.  Neurological:     General: No focal deficit present.     Mental Status: Alert and oriented to person, place and time.     Cranial Nerves: Cranial nerves are intact.  EKG/LABS/Other Studies Reviewed    ECG personally reviewed by me today -none completed today   Lab Results  Component Value Date   WBC 4.7 01/22/2022   HGB 9.8 (L) 01/22/2022   HCT 33.7  (L) 01/22/2022   MCV 75.9 (L) 01/22/2022   PLT  240 01/22/2022   Lab Results  Component Value Date   CREATININE 0.64 01/23/2022   BUN 11 01/23/2022   NA 144 01/23/2022   K 4.3 01/23/2022   CL 114 (H) 01/23/2022   CO2 25 01/23/2022   Lab Results  Component Value Date   ALT 10 01/21/2022   AST 11 (L) 01/21/2022   ALKPHOS 94 01/21/2022   BILITOT 1.0 01/21/2022   Lab Results  Component Value Date   CHOL 178 02/18/2017   HDL 48 02/18/2017   LDLCALC 102 (H) 02/18/2017   TRIG 139 02/18/2017   CHOLHDL 3.7 02/18/2017    Lab Results  Component Value Date   HGBA1C 4.8 02/19/2017    Assessment & Plan    1.  Supraventricular tachycardia: -Recent ZIO monitor revealed SVT -Today patient reports palpitations that have increased in intensity especially in the early morning and are associated with anxiety -We will increase Cardizem to 240 mg daily   2.  Nonobstructive coronary artery disease: -Coronary CTA completed showing nonobstructive  -Patient denies chest pain today -Continue ASA 81 mg and Lipitor 40 mg   3.  Hyperlipidemia: -We will plan to update lipids and LFTs on next visit -Continue atorvastatin 40 mg daily  4.  Dyspnea on exertion: -Patient is euvolemic on examination today -Patient reports dyspnea has improved but at times is bothersome due to deviated septum -Pulmonary referral was placed and patient followed up with no changes to current treatment plan -Continue Lasix as needed   5.  Aortic atherosclerosis: -Noted on most recent cardiac CTA -Continue ASA and Lipitor with strict BP control   Disposition: Follow-up with Early Osmond, MD or APP in 4 months   Medication Adjustments/Labs and Tests Ordered: Current medicines are reviewed at length with the patient today.  Concerns regarding medicines are outlined above.   Signed, Mable Fill, Marissa Nestle, NP 04/17/2022, 4:46 PM [place]

## 2022-04-18 ENCOUNTER — Ambulatory Visit: Payer: Medicare Other | Attending: Internal Medicine | Admitting: Nurse Practitioner

## 2022-04-18 ENCOUNTER — Encounter: Payer: Self-pay | Admitting: Nurse Practitioner

## 2022-04-18 VITALS — BP 128/74 | HR 76 | Ht 65.0 in | Wt 130.4 lb

## 2022-04-18 DIAGNOSIS — E785 Hyperlipidemia, unspecified: Secondary | ICD-10-CM | POA: Diagnosis not present

## 2022-04-18 DIAGNOSIS — I700 Atherosclerosis of aorta: Secondary | ICD-10-CM | POA: Diagnosis not present

## 2022-04-18 DIAGNOSIS — R0600 Dyspnea, unspecified: Secondary | ICD-10-CM

## 2022-04-18 DIAGNOSIS — R002 Palpitations: Secondary | ICD-10-CM

## 2022-04-18 DIAGNOSIS — I471 Supraventricular tachycardia: Secondary | ICD-10-CM | POA: Diagnosis not present

## 2022-04-18 MED ORDER — DILTIAZEM HCL ER COATED BEADS 240 MG PO CP24: 240.0000 mg | ORAL_CAPSULE | Freq: Every day | ORAL | 3 refills | Status: DC

## 2022-04-18 NOTE — Patient Instructions (Signed)
Medication Instructions:  Your physician has recommended you make the following change in your medication:   Increase Cardizem to '240mg'$  daily   *If you need a refill on your cardiac medications before your next appointment, please call your pharmacy   Follow-Up: At [facility], you and your health needs are our priority.  As part of our continuing mission to provide you with exceptional heart care, we have created designated Provider Care Teams.  These Care Teams include your primary Cardiologist (physician) and Advanced Practice Providers (APPs -  Physician Assistants and Nurse Practitioners) who all work together to provide you with the care you need, when you need it.  Follow-Up: At [facility], you and your health needs are our priority.  As part of our continuing mission to provide you with exceptional heart care, we have created designated Provider Care Teams.  These Care Teams include your primary Cardiologist (physician) and Advanced Practice Providers (APPs -  Physician Assistants and Nurse Practitioners) who all work together to provide you with the care you need, when you need it.   Your next appointment:   4 month(s)  The format for your next appointment:   In Person  Provider:   Early Osmond, MD  Or  Ambrose Pancoast, NP

## 2022-04-20 ENCOUNTER — Encounter: Payer: Self-pay | Admitting: Internal Medicine

## 2022-04-21 ENCOUNTER — Encounter (HOSPITAL_COMMUNITY): Payer: Self-pay

## 2022-04-21 ENCOUNTER — Emergency Department (HOSPITAL_COMMUNITY)
Admission: EM | Admit: 2022-04-21 | Discharge: 2022-04-22 | Disposition: A | Discharge status: Home / Self Care | Payer: Medicare Other | Attending: Emergency Medicine | Admitting: Emergency Medicine

## 2022-04-21 ENCOUNTER — Other Ambulatory Visit: Payer: Self-pay

## 2022-04-21 ENCOUNTER — Telehealth: Payer: Self-pay | Admitting: Home Health

## 2022-04-21 ENCOUNTER — Emergency Department (HOSPITAL_COMMUNITY): Payer: Medicare Other

## 2022-04-21 ENCOUNTER — Telehealth: Payer: Self-pay | Admitting: Internal Medicine

## 2022-04-21 DIAGNOSIS — Z9049 Acquired absence of other specified parts of digestive tract: Secondary | ICD-10-CM | POA: Diagnosis not present

## 2022-04-21 DIAGNOSIS — R079 Chest pain, unspecified: Secondary | ICD-10-CM | POA: Diagnosis not present

## 2022-04-21 DIAGNOSIS — M549 Dorsalgia, unspecified: Secondary | ICD-10-CM | POA: Insufficient documentation

## 2022-04-21 DIAGNOSIS — I471 Supraventricular tachycardia: Secondary | ICD-10-CM | POA: Diagnosis not present

## 2022-04-21 DIAGNOSIS — J984 Other disorders of lung: Secondary | ICD-10-CM | POA: Diagnosis not present

## 2022-04-21 DIAGNOSIS — N281 Cyst of kidney, acquired: Secondary | ICD-10-CM | POA: Diagnosis not present

## 2022-04-21 DIAGNOSIS — K8689 Other specified diseases of pancreas: Secondary | ICD-10-CM | POA: Diagnosis not present

## 2022-04-21 DIAGNOSIS — R110 Nausea: Secondary | ICD-10-CM | POA: Insufficient documentation

## 2022-04-21 DIAGNOSIS — R0789 Other chest pain: Secondary | ICD-10-CM

## 2022-04-21 DIAGNOSIS — K5730 Diverticulosis of large intestine without perforation or abscess without bleeding: Secondary | ICD-10-CM | POA: Diagnosis not present

## 2022-04-21 DIAGNOSIS — I2510 Atherosclerotic heart disease of native coronary artery without angina pectoris: Secondary | ICD-10-CM | POA: Diagnosis not present

## 2022-04-21 DIAGNOSIS — Z7982 Long term (current) use of aspirin: Secondary | ICD-10-CM | POA: Insufficient documentation

## 2022-04-21 DIAGNOSIS — R002 Palpitations: Secondary | ICD-10-CM

## 2022-04-21 NOTE — ED Provider Triage Note (Signed)
Emergency Medicine Provider Triage Evaluation Note  Dorothy White , a 71 y.o. female  was evaluated in triage.  Pt complains of not feeling well today, HR was elevated with elevated BP at 155/85. PCP recently doubled her Cardizem Friday. CP and back pain onset 4pm today, left near axillary to left scapula, constant, nothing makes pain worse. Not worse with exertion.  Hx palpitations treated with Cardizem.  Review of Systems  Positive: CP, back pain, nausea, diaphoresis Negative: SHOB    Physical Exam  BP (!) 142/76 (BP Location: Left Arm)   Pulse 63   Temp 98.4 F (36.9 C) (Oral)   Resp 16   SpO2 98%  Gen:   Awake, no distress   Resp:  Normal effort  MSK:   Moves extremities without difficulty  Other:    Medical Decision Making  Medically screening exam initiated at 8:05 PM.  Appropriate orders placed.  Dorothy White was informed that the remainder of the evaluation will be completed by another provider, this initial triage assessment does not replace that evaluation, and the importance of remaining in the ED until their evaluation is complete.     Tacy Learn, PA-C 04/21/22 2007

## 2022-04-21 NOTE — Telephone Encounter (Signed)
.  Pt c/o medication issue:  1. Name of Medication: Diltiazem  2. How are you currently taking this medication (dosage and times per day)? 1 time at night  3. Are you having a reaction (difficulty breathing--STAT)?   4. What is your medication issue? Heart racing, pulse was 121 this morning and now 75, keep going up and down blood pressure is now 135/85 it was 145/88

## 2022-04-21 NOTE — Telephone Encounter (Signed)
Patient is called again at [phone], reports poor signal, states AP 121 and BP was high up to 155/87, feeling nausea and unwell all day, having new onset of  chest pain,  back pain, and shoulder pain. Takes Cardizem '240mg'$  last night, has no taken it today yet. She is very concerned about her symptoms. She called the office multiple times and could not talk to anyone due to poor phone service. Unable to assess the patient over the phone given multiple complaints. Advised the patient to go to the ER for evaluation.

## 2022-04-21 NOTE — Telephone Encounter (Signed)
Patient called after-hours line again, called patient twice again at [phone].  She continued to not answering her phone.  Voicemail left again for patient to call back if there is further concern.

## 2022-04-21 NOTE — ED Triage Notes (Signed)
Pt reports with chest pain that goes to her back since today. Pt states that her pulse has been high today and that her bp has been elevated.

## 2022-04-21 NOTE — Telephone Encounter (Signed)
Left message for patient to call back.  Please see MyChart message from yesterday for additional information.

## 2022-04-21 NOTE — Telephone Encounter (Signed)
Patient paged after hour line reporting concern of her blood pressure and heart rate, called back at [phone] twice, no one answered, voicemail left for patient to call back if there is further concern.

## 2022-04-22 ENCOUNTER — Encounter (HOSPITAL_COMMUNITY): Payer: Self-pay | Admitting: Radiology

## 2022-04-22 ENCOUNTER — Emergency Department (HOSPITAL_COMMUNITY): Payer: Medicare Other

## 2022-04-22 ENCOUNTER — Encounter: Payer: Self-pay | Admitting: Physician Assistant

## 2022-04-22 DIAGNOSIS — R079 Chest pain, unspecified: Secondary | ICD-10-CM | POA: Diagnosis not present

## 2022-04-22 DIAGNOSIS — K5730 Diverticulosis of large intestine without perforation or abscess without bleeding: Secondary | ICD-10-CM | POA: Diagnosis not present

## 2022-04-22 DIAGNOSIS — J984 Other disorders of lung: Secondary | ICD-10-CM | POA: Diagnosis not present

## 2022-04-22 DIAGNOSIS — I471 Supraventricular tachycardia: Secondary | ICD-10-CM | POA: Diagnosis not present

## 2022-04-22 DIAGNOSIS — R002 Palpitations: Secondary | ICD-10-CM | POA: Diagnosis not present

## 2022-04-22 DIAGNOSIS — M549 Dorsalgia, unspecified: Secondary | ICD-10-CM | POA: Diagnosis not present

## 2022-04-22 DIAGNOSIS — N281 Cyst of kidney, acquired: Secondary | ICD-10-CM | POA: Diagnosis not present

## 2022-04-22 DIAGNOSIS — K8689 Other specified diseases of pancreas: Secondary | ICD-10-CM | POA: Diagnosis not present

## 2022-04-22 DIAGNOSIS — Z9049 Acquired absence of other specified parts of digestive tract: Secondary | ICD-10-CM | POA: Diagnosis not present

## 2022-04-22 LAB — BASIC METABOLIC PANEL
Anion gap: 7 (ref 5–15)
BUN: 11 mg/dL (ref 8–23)
CO2: 26 mmol/L (ref 22–32)
Calcium: 9.3 mg/dL (ref 8.9–10.3)
Chloride: 109 mmol/L (ref 98–111)
Creatinine, Ser: 0.67 mg/dL (ref 0.44–1.00)
GFR, Estimated: >60 mL/min (ref >60)
Glucose, Bld: 98 mg/dL (ref 70–99)
Potassium: 4.4 mmol/L (ref 3.5–5.1)
Sodium: 142 mmol/L (ref 135–145)

## 2022-04-22 LAB — CBC
HCT: 46.9 % — ABNORMAL HIGH (ref 36.0–46.0)
Hemoglobin: 14.2 g/dL (ref 12.0–15.0)
MCH: 26.1 pg (ref 26.0–34.0)
MCHC: 30.3 g/dL (ref 30.0–36.0)
MCV: 86.1 fL (ref 80.0–100.0)
Platelets: 251 10*3/uL (ref 150–400)
RBC: 5.45 MIL/uL — ABNORMAL HIGH (ref 3.87–5.11)
RDW: 19.3 % — ABNORMAL HIGH (ref 11.5–15.5)
WBC: 6.9 10*3/uL (ref 4.0–10.5)
nRBC: 0 % (ref 0.0–0.2)

## 2022-04-22 LAB — TSH: TSH: 1.336 u[IU]/mL (ref 0.350–4.500)

## 2022-04-22 LAB — TROPONIN I (HIGH SENSITIVITY)
Troponin I (High Sensitivity): 3 ng/L (ref <18)
Troponin I (High Sensitivity): 3 ng/L (ref <18)

## 2022-04-22 LAB — D-DIMER, QUANTITATIVE: D-Dimer, Quant: 0.41 ug/mL-FEU (ref 0.00–0.50)

## 2022-04-22 LAB — MAGNESIUM: Magnesium: 1.9 mg/dL (ref 1.7–2.4)

## 2022-04-22 MED ORDER — PROPRANOLOL HCL 10 MG PO TABS: 10.0000 mg | ORAL_TABLET | Freq: Two times a day (BID) | ORAL | 0 refills | Status: AC

## 2022-04-22 MED ORDER — MORPHINE SULFATE (PF) 2 MG/ML IV SOLN
2.0000 mg | Freq: Once | INTRAVENOUS | Status: AC
  Administered 2022-04-22: 2 mg via INTRAVENOUS
  Filled 2022-04-22: qty 1

## 2022-04-22 MED ORDER — ONDANSETRON HCL 4 MG/2ML IJ SOLN: 4.0000 mg | Freq: Once | INTRAMUSCULAR | Status: DC

## 2022-04-22 MED ORDER — HYDROMORPHONE HCL 1 MG/ML IJ SOLN
0.5000 mg | Freq: Once | INTRAMUSCULAR | Status: AC
  Administered 2022-04-22: 0.5 mg via INTRAVENOUS
  Filled 2022-04-22: qty 1

## 2022-04-22 MED ORDER — IOHEXOL 350 MG/ML SOLN
100.0000 mL | Freq: Once | INTRAVENOUS | Status: AC | PRN
  Administered 2022-04-22: 100 mL via INTRAVENOUS

## 2022-04-22 MED ORDER — ASPIRIN 81 MG PO CHEW
324.0000 mg | CHEWABLE_TABLET | Freq: Once | ORAL | Status: AC
  Administered 2022-04-22: 324 mg via ORAL
  Filled 2022-04-22: qty 4

## 2022-04-22 MED ORDER — SODIUM CHLORIDE (PF) 0.9 % IJ SOLN
INTRAMUSCULAR | Status: AC
  Filled 2022-04-22: qty 50

## 2022-04-22 MED ORDER — IOHEXOL 350 MG/ML SOLN: 75.0000 mL | Freq: Once | INTRAVENOUS | Status: DC | PRN

## 2022-04-22 NOTE — ED Provider Notes (Signed)
[facility] DEPT Provider Note   CSN: [id number] Arrival date & time: 04/21/22  1931     History  Chief Complaint  Patient presents with   Chest Pain   Back Pain    Dorothy White is a 71 y.o. female.  Patient with a history of nonischemic cardiomyopathy, previous mild CAD with questionable MI, no stents, acid reflux disease, palpitations on Cardizem, chronic back pain presenting with palpitations and nausea ongoing for the past 24 hours.  States yesterday she felt her heart racing on and off and felt nauseous and generally poor all day.  She has had palpitations like this previously and these were similar but more severe.  Her cardiologist placed her on Cardizem which she takes at night but she does not think this helps.  Around 4 PM yesterday she developed left-sided chest pain that radiates to her left back and mid scapular area.  Pain is fairly constant but waxes and wanes in severity.  Worse with lying down.  Nothing makes it better.  No pain with arm movement.  Denies shortness of breath, cough or fever.  No abdominal pain, leg pain or leg swelling.  She is never had this kind of pain in the past.  Did not take anything for it at home.  She describes pressure that radiates through to her back.  The palpitations have improved.  Denies any dizziness or room spinning sensation.  The history is provided by the patient.  Chest Pain Associated symptoms: back pain and palpitations   Associated symptoms: no headache, no shortness of breath and no weakness   Back Pain Associated symptoms: chest pain   Associated symptoms: no dysuria, no headaches and no weakness        Home Medications Prior to Admission medications   Medication Sig Start Date End Date Taking? Authorizing Provider  acetaminophen (TYLENOL) 500 MG tablet Take 1,000 mg by mouth every 8 (eight) hours as needed for headache (pain).    [provider]  aspirin EC 81 MG tablet Take  1 tablet (81 mg total) by mouth daily. Swallow whole. 12/16/21   Early Osmond, MD  atorvastatin (LIPITOR) 40 MG tablet Take 1 tablet (40 mg total) by mouth at bedtime. 01/23/22   Aline August, MD  Cholecalciferol (VITAMIN D3) 50 MCG (2000 UT) capsule Take 2,000 Units by mouth every morning. 1 capsule 05/07/21   [provider]  diltiazem (CARDIZEM CD) 240 MG 24 hr capsule Take 1 capsule (240 mg total) by mouth daily. 04/18/22   Early Osmond, MD  docusate sodium (COLACE) 100 MG capsule Take 1 capsule (100 mg total) by mouth daily as needed for mild constipation. 01/23/22   Aline August, MD  ferrous sulfate 325 (65 FE) MG tablet Take 1 tablet (325 mg total) by mouth daily with breakfast. 01/24/22   Aline August, MD  FLUoxetine (PROZAC) 40 MG capsule Take 80 mg by mouth every morning. 04/18/18   [provider]  furosemide (LASIX) 20 MG tablet Take 20 mg by mouth daily as needed for fluid or edema (ankle swelling). 10/25/21   [provider]  gabapentin (NEURONTIN) 100 MG capsule Take 200 mg by mouth 3 (three) times daily as needed (nerve pain).    [provider]  metoprolol tartrate (LOPRESSOR) 100 MG tablet Take one tablet by mouth 2 hours before CT scan 02/05/22   Early Osmond, MD  mirtazapine (REMERON) 15 MG tablet TAKE 1 TABLET BY MOUTH EVERYDAY AT  BEDTIME 10/28/21   Tat, Eustace Quail, DO  omeprazole (PRILOSEC) 20 MG capsule Take 20 mg by mouth every morning.    [provider]  ondansetron (ZOFRAN) 4 MG tablet Take 4 mg by mouth daily as needed for nausea or vomiting. 11/15/21   [provider]  oxyCODONE-acetaminophen (PERCOCET) 10-325 MG tablet Take 1 tablet by mouth every 4 (four) hours as needed for pain. 01/08/21   [provider]  SUMAtriptan (IMITREX) 100 MG tablet Take 50-100 mg by mouth daily as needed for migraine. 12/24/18   [provider]  tiZANidine (ZANAFLEX) 4 MG tablet Take 4 mg by mouth 3 (three) times  daily as needed for muscle spasms. 04/10/22   [provider]  traZODone (DESYREL) 100 MG tablet Take 100 mg by mouth at bedtime. 09/25/21   [provider]  vitamin B-12 (CYANOCOBALAMIN) 1000 MCG tablet Take 1,000 mcg by mouth daily. 1 tablet 05/07/21   [provider]      Allergies    Opana [oxymorphone hcl]    Review of Systems   Review of Systems  HENT:  Negative for congestion.   Respiratory:  Positive for chest tightness. Negative for shortness of breath.   Cardiovascular:  Positive for chest pain and palpitations.  Genitourinary:  Negative for dysuria.  Musculoskeletal:  Positive for back pain.  Neurological:  Negative for weakness and headaches.   all other systems are negative except as noted in the HPI and PMH.    Physical Exam Updated Vital Signs BP (!) 149/122   Pulse 75   Temp 98.4 F (36.9 C) (Oral)   Resp 18   SpO2 93%  Physical Exam Vitals and nursing note reviewed.  Constitutional:      General: She is not in acute distress.    Appearance: She is well-developed.  HENT:     Head: Normocephalic and atraumatic.     Mouth/Throat:     Pharynx: No oropharyngeal exudate.  Eyes:     Conjunctiva/sclera: Conjunctivae normal.     Pupils: Pupils are equal, round, and reactive to light.  Neck:     Comments: No meningismus. Cardiovascular:     Rate and Rhythm: Normal rate and regular rhythm.     Heart sounds: Normal heart sounds. No murmur heard. Pulmonary:     Effort: Pulmonary effort is normal. No respiratory distress.     Breath sounds: Normal breath sounds.     Comments: Chest pain is not reproducible.  No rash. No pain with left arm movement Chest:     Chest wall: No tenderness.  Abdominal:     Palpations: Abdomen is soft.     Tenderness: There is no abdominal tenderness. There is no guarding or rebound.  Musculoskeletal:        General: No tenderness. Normal range of motion.     Cervical back: Normal range of motion and neck  supple.  Skin:    General: Skin is warm.  Neurological:     Mental Status: She is alert and oriented to person, place, and time.     Cranial Nerves: No cranial nerve deficit.     Motor: No abnormal muscle tone.     Coordination: Coordination normal.     Comments:  5/5 strength throughout. CN 2-12 intact.Equal grip strength.   Psychiatric:        Behavior: Behavior normal.     ED Results / Procedures / Treatments   Labs (all labs ordered are listed, but only abnormal results  are displayed) Labs Reviewed  CBC - Abnormal; Notable for the following components:      Result Value   RBC 5.45 (*)    HCT 46.9 (*)    RDW 19.3 (*)    All other components within normal limits  BASIC METABOLIC PANEL  TSH  MAGNESIUM  D-DIMER, QUANTITATIVE  TROPONIN I (HIGH SENSITIVITY)  TROPONIN I (HIGH SENSITIVITY)    EKG EKG Interpretation  Date/Time:  Monday April 21 2022 20:19:28 EDT Ventricular Rate:  68 PR Interval:  98 QRS Duration: 113 QT Interval:  623 QTC Calculation: 663 R Axis:   52 Text Interpretation: Sinus or ectopic atrial rhythm Short PR interval Borderline intraventricular conduction delay Nonspecific T abnrm, anterolateral leads Prolonged QT interval No significant change since last tracing Confirmed by Orpah Greek [phone]) on 04/22/2022 3:46:06 AM  Radiology DG Chest 2 View  Result Date: 04/21/2022 CLINICAL DATA:  Chest pain EXAM: CHEST - 2 VIEW COMPARISON:  Chest x-ray with ribs 01/02/2021 FINDINGS: The heart size and mediastinal contours are within normal limits. There is linear scarring or atelectasis in the right lung base. The lungs are otherwise clear. No pleural effusion or pneumothorax. Peripherally calcified breast implants are again seen. There are orthopedic plates in the bilateral proximal humeri. IMPRESSION: No active cardiopulmonary disease. Electronically Signed   By: Ronney Asters M.D.   On: 04/21/2022 20:05    Procedures Procedures     Medications Ordered in ED Medications  aspirin chewable tablet 324 mg (has no administration in time range)  morphine (PF) 2 MG/ML injection 2 mg (has no administration in time range)    ED Course/ Medical Decision Making/ A&P                           Medical Decision Making Amount and/or Complexity of Data Reviewed Labs: ordered. Decision-making details documented in ED Course. Radiology: ordered and independent interpretation performed. Decision-making details documented in ED Course. ECG/medicine tests: ordered and independent interpretation performed. Decision-making details documented in ED Course.  Risk OTC drugs. Prescription drug management.  Palpitations and nausea since yesterday now improved.  Ongoing left-sided chest pain going to back constant since 4 PM yesterday but waxing and wanes in severity.  EKG does show new T wave inversions laterally compared to April 2023.  Initial troponin is negative.  Will check D-dimer  Chart review shows patient was seen by cardiology 4 days ago with titration of her diltiazem for palpitations which were thought to be due to SVT.  She underwent a CT coronary study in August 2023 that showed minimal CAD.  D-dimer is negative.  Low suspicion for pulmonary embolism.  Repeat troponin pending.  Discussed with cardiology given her recent visit as well as EKG changes.  They will evaluate.  Patient did have contrast extravasation during CT scan.  She was given warm compresses, elevation..  CTA is negative for aortic dissection or pulmonary embolism.  Results reviewed and interpreted by me.  Chest pain has improved with medication in the ED.  Suspect likely musculoskeletal.  It is reassuring her troponins are negative in the setting of many hours of constant pain.  EKG changes discussed with Dr. Audie Box with cardiology.  He agrees these are nonspecific.  Patient with recent CT coronary study in August that showed minimal disease.  He  recommends stopping diltiazem and switching her to propranolol.  Chest pain may be musculoskeletal in origin or possibly GI or anxiety related.  Also consider shingles with rash not yet visible.  Discussed follow-up with PCP as well as cardiology.  Continue to elevate her right arm and apply warm compresses at the site of contrast extravasation.  It is still soft and compressible and swelling has not worsened since she has been in the ED.  Continue range of motion and arm elevation.  Return to the ED sooner if the arm becomes more painful, becomes rockhard, associate with numbness, tingling, weakness or any other concerns.       Final Clinical Impression(s) / ED Diagnoses Final diagnoses:  Atypical chest pain  Palpitations    Rx / DC Orders ED Discharge Orders     None         Yareliz Thorstenson, Annie Main, MD 04/22/22 1356

## 2022-04-22 NOTE — Consult Note (Signed)
Cardiology Consultation:  Patient ID: Dorothy White MRN: [id number]; DOB: 1950/09/25  Admit date: 04/21/2022 Date of Consult: 04/22/2022  Primary Care Provider: Jonathon Jordan, MD Primary Cardiologist: Early Osmond, MD  Primary Electrophysiologist:  None   Patient Profile:  Dorothy White is a 71 y.o. female with a hx of nonobstructive CAD, hypertension, tobacco abuse, brief SVT who is being seen today for the evaluation of chest pain at the request of Ronny Flurry, MD.  History of Present Illness:  [name] presents with 1 day of what she describes as sharp left-sided chest discomfort as well as back pain.  She also reports intermittent palpitations.  She follows with Dr. [name].  Underwent coronary CTA in August which showed minimal nonobstructive CAD.  Her echocardiogram showed normal LV function.  She did wear a monitor with due to complaints of palpitations and had brief SVT episodes.  Episodes were very short in duration up to 10 seconds.  She reports yesterday her symptoms worsened.  Her heart rate was in the 120s most of the day.  She does endorse stress and anxiety which are likely contributing.  Her symptoms of chest pain have resolved.  She now only has back pain.  Her heart racing has resolved as well.  Telemetry here has been unremarkable.  Her EKG is very normal.  Her cardiac examination is normal as well.  She does endorse migraines on diltiazem.  We discussed trying propranolol to see if this can treat both her brief SVT as well as her migraines.  She is interested.  Laboratory data here at [facility] shows normal creatinine 0.67.  High-sensitivity troponins negative x2 at a value of 3.  Her hemoglobin is 14.2.  TSH is normal as well.  CT dissection study negative.  Chest x-ray clear.  Heart Pathway Score:       Past Medical History: Past Medical History:  Diagnosis Date   Anemia    ?   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 25% mLAD, otherwise no sig disease 01/2016 cath.   NICM (nonischemic cardiomyopathy) ([place])    a. EF 40-45% by echo 07/7508 at time of complicated migraine/neuro sx, 55-65% at time of cath 01/2016   Stroke [facility]) 06/2013   "mini" stroke , ?possibly hemaplegic migraine per pt    Past Surgical History: Past Surgical History:  Procedure Laterality Date   ANTERIOR APPROACH HEMI HIP ARTHROPLASTY Left 04/01/2017   Procedure: LEFT DIRECT ANTERIOR TOTAL HIP REPLACEMENT;  Surgeon: Leandrew Koyanagi, MD;  Location: [place];  Service: Orthopedics;  Laterality: Left;  LEFT DIRECT ANTERIOR TOTAL HIP REPLACEMENT   AUGMENTATION MAMMAPLASTY  1980   BASAL CELL CARCINOMA EXCISION     "tip of my nose"   BUNIONECTOMY Bilateral 10/2003   CARDIAC CATHETERIZATION N/A 02/20/2016   Procedure: Left Heart Cath and  Coronary Angiography;  Surgeon: Jettie Booze, MD;  Location: [place] [facility];  Service: Cardiovascular;  Laterality: N/A;   COLONOSCOPY     DILATION AND CURETTAGE OF UTERUS     FRACTURE SURGERY     IR GENERIC HISTORICAL  03/18/2016   IR SINUS/FIST TUBE CHK-NON GI 03/18/2016 Aletta Edouard, MD [facility]   LAPAROSCOPIC SIGMOID COLECTOMY N/A 04/25/2016   Procedure: LAPAROSCOPIC SIGMOID COLECTOMY;  Surgeon: Stark Klein, MD;  Location: [place];  Service: General;  Laterality: N/A;   OOPHORECTOMY Bilateral ~ 1999   ORIF HUMERUS FRACTURE Right 02/15/2015   Procedure: OPEN REDUCTION INTERNAL FIXATION  (ORIF) PROXIMAL HUMERUS FRACTURE;  Surgeon: Netta Cedars, MD;  Location: [place];  Service: Orthopedics;  Laterality: Right;   ORIF HUMERUS FRACTURE Left 04/04/2020   ORIF HUMERUS FRACTURE Left 04/04/2020   Procedure: OPEN REDUCTION INTERNAL FIXATION (ORIF) PROXIMAL HUMERUS FRACTURE;  Surgeon: Iran Planas, MD;  Location: [place];  Service: Orthopedics;  Laterality: Left;  regional block   ORIF ULNAR FRACTURE Left 04/04/2020   ORIF ULNAR FRACTURE Left 04/04/2020   Procedure: OPEN REDUCTION INTERNAL FIXATION (ORIF) ULNAR FRACTURE;  Surgeon: Iran Planas, MD;  Location: [place];  Service: Orthopedics;  Laterality: Left;  with regional block   [place] Right 08/11/2020   Procedure: TOTAL HIP ARTHROPLASTY ANTERIOR APPROACH;  Surgeon: Rod Can, MD;  Location: [facility];  Service: Orthopedics;  Laterality: Right;   TUBAL LIGATION  ~ 1983   VAGINAL HYSTERECTOMY  ~ 1998     Home Medications:  Prior to Admission medications   Medication Sig Start Date End Date Taking? Authorizing Provider  acetaminophen (TYLENOL) 500 MG tablet Take 1,000 mg by mouth every 8 (eight) hours as needed for headache (pain).    [provider]  aspirin EC 81 MG tablet Take 1 tablet (81 mg total) by mouth daily. Swallow whole. 12/16/21   Early Osmond, MD  atorvastatin (LIPITOR) 40 MG tablet Take 1 tablet (40 mg total) by mouth at bedtime. 01/23/22   Aline August, MD  Cholecalciferol (VITAMIN D3) 50 MCG (2000 UT) capsule Take 2,000 Units by mouth every morning. 1 capsule 05/07/21   [provider]  diltiazem (CARDIZEM CD) 240 MG 24 hr capsule Take 1 capsule (240 mg total) by mouth daily. 04/18/22   Early Osmond, MD  docusate sodium (COLACE) 100 MG capsule Take 1 capsule (100 mg total) by mouth daily as needed for mild constipation. 01/23/22   Aline August, MD  ferrous sulfate 325 (65 FE) MG tablet Take 1 tablet (325 mg total) by mouth daily with  breakfast. 01/24/22   Aline August, MD  FLUoxetine (PROZAC) 40 MG capsule Take 80 mg by mouth every morning. 04/18/18   [provider]  furosemide (LASIX) 20 MG tablet Take 20 mg by mouth daily as needed for fluid or edema (ankle swelling). 10/25/21   [provider]  gabapentin (NEURONTIN) 100 MG capsule Take 200 mg by mouth 3 (three) times daily as needed (nerve pain).    [provider]  metoprolol tartrate (LOPRESSOR) 100 MG tablet Take one tablet by mouth 2 hours before CT scan 02/05/22   Early Osmond, MD  mirtazapine (REMERON) 15 MG tablet TAKE 1 TABLET BY MOUTH EVERYDAY AT BEDTIME 10/28/21   Tat, Eustace Quail, DO  omeprazole (PRILOSEC) 20 MG capsule Take 20 mg by mouth every morning.    [provider]  ondansetron (  ZOFRAN) 4 MG tablet Take 4 mg by mouth daily as needed for nausea or vomiting. 11/15/21   [provider]  oxyCODONE-acetaminophen (PERCOCET) 10-325 MG tablet Take 1 tablet by mouth every 4 (four) hours as needed for pain. 01/08/21   [provider]  SUMAtriptan (IMITREX) 100 MG tablet Take 50-100 mg by mouth daily as needed for migraine. 12/24/18   [provider]  tiZANidine (ZANAFLEX) 4 MG tablet Take 4 mg by mouth 3 (three) times daily as needed for muscle spasms. 04/10/22   [provider]  traZODone (DESYREL) 100 MG tablet Take 100 mg by mouth at bedtime. 09/25/21   [provider]  vitamin B-12 (CYANOCOBALAMIN) 1000 MCG tablet Take 1,000 mcg by mouth daily. 1 tablet 05/07/21   [provider]    Inpatient Medications: Scheduled Meds:  sodium chloride (PF)       sodium chloride (PF)       Continuous Infusions:  PRN Meds: iohexol, sodium chloride (PF), sodium chloride (PF)  Allergies:    Allergies  Allergen Reactions   Opana [Oxymorphone Hcl] Other (See Comments)    hallucinations    Social History:   Social History   Socioeconomic History   Marital status: Single    Spouse  name: none   Number of children: 0   Years of education: 12   Highest education level: 12th grade  Occupational History   Occupation: retired    Fish farm manager: Financial trader    Comment: Retired    Fish farm manager: [employer] AT [employer]  Tobacco Use   Smoking status: Some Days    Packs/day: 0.25    Years: 34.00    Total pack years: 8.50    Types: Cigarettes   Smokeless tobacco: Never  Vaping Use   Vaping Use: Never used  Substance and Sexual Activity   Alcohol use: Not Currently    Alcohol/week: 1.0 standard drink of alcohol    Types: 1 Standard drinks or equivalent per week    Comment: occ    Drug use: No    Comment: Denies any drug use 02/19/18   Sexual activity: Never  Other Topics Concern   Not on file  Social History Narrative   Patient lives at home alone.    Patient is retired.   Education- High school and some college   Right handed.   Patient drinks one cup of coffee and a big glass of ice tea.   Social Determinants of Health   Financial Resource Strain: Not on file  Food Insecurity: No Food Insecurity (03/15/2021)   Hunger Vital Sign    Worried About Running Out of Food in the Last Year: Never true    Ran Out of Food in the Last Year: Never true  Transportation Needs: No Transportation Needs (03/15/2021)   PRAPARE - Hydrologist (Medical): No    Lack of Transportation (Non-Medical): No  Physical Activity: Not on file  Stress: No Stress Concern Present (03/15/2021)   [place]    Feeling of Stress : Only a little  Social Connections: Not on file  Intimate Partner Violence: Not on file     Family History:    Family History  Problem Relation Age of Onset   Lung cancer Mother 89   Migraines Mother    Heart attack Father        Vague   Hypertension Brother    Esophageal cancer Brother  Diabetes Paternal Grandmother        questionable   Colon cancer Neg Hx    Kidney  disease Neg Hx    Liver disease Neg Hx      ROS:  All other ROS reviewed and negative. Pertinent positives noted in the HPI.     Physical Exam/Data:   Vitals:   04/22/22 1000 04/22/22 1015 04/22/22 1030 04/22/22 1200  BP: (!) 153/80 (!) 163/77 (!) 153/70 (!) 153/70  Pulse: 75 69 68 72  Resp: '17 12 13 16  '$ Temp:    97.9 F (36.6 C)  TempSrc:    Oral  SpO2: 97% 98% 97% 100%   No intake or output data in the 24 hours ending 04/22/22 1309     04/18/2022   11:26 AM 04/16/2022    2:22 PM 01/20/2022    1:36 PM  Last 3 Weights  Weight (lbs) 130 lb 6.4 oz 132 lb 146 lb 2.6 oz  Weight (kg) 59.149 kg 59.875 kg 66.3 kg    There is no height or weight on file to calculate BMI.  General: Well nourished, well developed, in no acute distress Head: Atraumatic, normal size  Eyes: PEERLA, EOMI  Neck: Supple, no JVD Endocrine: No thryomegaly Cardiac: Normal S1, S2; RRR; no murmurs, rubs, or gallops Lungs: Clear to auscultation bilaterally, no wheezing, rhonchi or rales  Abd: Soft, nontender, no hepatomegaly  Ext: No edema, pulses 2+ Musculoskeletal: No deformities, BUE and BLE strength normal and equal Skin: Warm and dry, no rashes   Neuro: Alert and oriented to person, place, time, and situation, CNII-XII grossly intact, no focal deficits  Psych: Normal mood and affect   EKG:  The EKG was personally reviewed and demonstrates: Sinus rhythm heart rate 67, nonspecific ST-T changes Telemetry:  Telemetry was personally reviewed and demonstrates: Sinus rhythm in the 60s  Relevant CV Studies: TTE 12/31/2021  1. Left ventricular ejection fraction, by estimation, is 60 to 65%. The  left ventricle has normal function. The left ventricle has no regional  wall motion abnormalities. Left ventricular diastolic parameters are  consistent with Grade I diastolic  dysfunction (impaired relaxation).   2. Right ventricular systolic function is normal. The right ventricular  size is normal. There is mildly  elevated pulmonary artery systolic  pressure. The estimated right ventricular systolic pressure is 67.3 mmHg.   3. The mitral valve is normal in structure. No evidence of mitral valve  regurgitation. No evidence of mitral stenosis.   4. The aortic valve is tricuspid. Aortic valve regurgitation is not  visualized. No aortic stenosis is present.   5. The inferior vena cava is normal in size with greater than 50%  respiratory variability, suggesting right atrial pressure of 3 mmHg.   CCTA 03/12/2022 IMPRESSION: 1. Coronary calcium score of 0. This was 1st percentile for age, sex, and race matched control.   2. Normal coronary origin with Right dominance.   3. CAD-RADS 2. Mild non-obstructive CAD (25-49%). Consider non-atherosclerotic causes of chest pain. Consider preventive therapy and risk factor modification despite zero calcium score.   Laboratory Data: High Sensitivity Troponin:   Recent Labs  Lab 04/22/22 0018 04/22/22 0817  TROPONINIHS 3 3     Cardiac EnzymesNo results for input(s): "TROPONINI" in the last 168 hours. No results for input(s): "TROPIPOC" in the last 168 hours.  Chemistry Recent Labs  Lab 04/22/22 0018  NA 142  K 4.4  CL 109  CO2 26  GLUCOSE 98  BUN 11  CREATININE 0.67  CALCIUM 9.3  GFRNONAA >60  ANIONGAP 7    No results for input(s): "PROT", "ALBUMIN", "AST", "ALT", "ALKPHOS", "BILITOT" in the last 168 hours. Hematology Recent Labs  Lab 04/22/22 0018  WBC 6.9  RBC 5.45*  HGB 14.2  HCT 46.9*  MCV 86.1  MCH 26.1  MCHC 30.3  RDW 19.3*  PLT 251   BNPNo results for input(s): "BNP", "PROBNP" in the last 168 hours.  DDimer  Recent Labs  Lab 04/22/22 [phone]  DDIMER 0.41    Radiology/Studies:  CT Angio Chest/Abd/Pel for Dissection W and/or Wo Contrast  Result Date: 04/22/2022 CLINICAL DATA:  Chest and back pain. EXAM: CT ANGIOGRAPHY CHEST, ABDOMEN AND PELVIS TECHNIQUE: Multidetector CT imaging through the chest, abdomen and pelvis was  performed using the standard protocol during bolus administration of intravenous contrast. Multiplanar reconstructed images and MIPs were obtained and reviewed to evaluate the vascular anatomy. RADIATION DOSE REDUCTION: This exam was performed according to the departmental dose-optimization program which includes automated exposure control, adjustment of the mA and/or kV according to patient size and/or use of iterative reconstruction technique. CONTRAST:  115m OMNIPAQUE IOHEXOL 350 MG/ML SOLN COMPARISON:  Chest x-ray from yesterday. Coronary CTA dated March 12, 2022. CT chest dated May 18, 2017. CT abdomen pelvis dated January 16, 2017. FINDINGS: CTA CHEST FINDINGS Cardiovascular: Preferential opacification of the thoracic aorta. No evidence of thoracic aortic aneurysm or dissection. Normal heart size. No pericardial effusion. No central pulmonary embolism. Mediastinum/Nodes: No enlarged mediastinal, hilar, or axillary lymph nodes. Thyroid gland, trachea, and esophagus demonstrate no significant findings. Lungs/Pleura: Unchanged mild linear scarring in both lower lobes. No focal consolidation, pleural effusion, or pneumothorax. Musculoskeletal: No chest wall abnormality. No acute or significant osseous findings. Review of the MIP images confirms the above findings. CTA ABDOMEN AND PELVIS FINDINGS VASCULAR Aorta: Normal caliber aorta without aneurysm, dissection, vasculitis or significant stenosis. Celiac: Patent without evidence of aneurysm, dissection, vasculitis or significant stenosis. SMA: Patent without evidence of aneurysm, dissection, vasculitis or significant stenosis. Renals: Both renal arteries are patent without evidence of aneurysm, dissection, vasculitis, fibromuscular dysplasia or significant stenosis. IMA: Patent without evidence of aneurysm, dissection, vasculitis or significant stenosis. Inflow: Patent without evidence of aneurysm, dissection, vasculitis or significant stenosis. Veins: No  obvious venous abnormality within the limitations of this arterial phase study. Review of the MIP images confirms the above findings. NON-VASCULAR Hepatobiliary: No focal liver abnormality is seen. No gallstones, gallbladder wall thickening, or biliary dilatation. Pancreas: Chronic mild dilatation of the main pancreatic duct, unchanged since 2018. No surrounding inflammatory changes. Spleen: Normal in size without focal abnormality. Adrenals/Urinary Tract: Adrenal glands are unremarkable. Small bilateral renal simple cysts are slightly increased in size since 2018. No follow-up imaging is recommended. Kidneys are otherwise normal, without renal calculi, solid lesion, or hydronephrosis. Bladder is mostly obscured by streak artifact. Stomach/Bowel: Stomach is within normal limits. Appendix appears normal. No evidence of bowel wall thickening, distention, or inflammatory changes. Prior partial sigmoid colon resection. Diverticulosis of descending colon. Lymphatic: No enlarged abdominal or pelvic lymph nodes. Reproductive: Status post hysterectomy. No adnexal masses. Other: No abdominal wall hernia or abnormality. No abdominopelvic ascites. No pneumoperitoneum. Musculoskeletal: No acute or significant osseous findings. Prior bilateral total hip arthroplasties. Review of the MIP images confirms the above findings. IMPRESSION: 1. No evidence of acute aortic syndrome or aneurysm. 2. No acute intrathoracic or intra-abdominal process. Electronically Signed   By: WTitus DubinM.D.   On: 04/22/2022 11:38   DG Chest 2  View  Result Date: 04/21/2022 CLINICAL DATA:  Chest pain EXAM: CHEST - 2 VIEW COMPARISON:  Chest x-ray with ribs 01/02/2021 FINDINGS: The heart size and mediastinal contours are within normal limits. There is linear scarring or atelectasis in the right lung base. The lungs are otherwise clear. No pleural effusion or pneumothorax. Peripherally calcified breast implants are again seen. There are orthopedic  plates in the bilateral proximal humeri. IMPRESSION: No active cardiopulmonary disease. Electronically Signed   By: Ronney Asters M.D.   On: 04/21/2022 20:05    Assessment and Plan:   #Chest pain, noncardiac -Here in the emergency room with chest pain symptoms.  Troponin negative x2.  EKG unremarkable.  Recent coronary CTA shows minimal CAD.  Echo was normal as well recently.  To me this is noncardiac chest pain.  Could be acid reflux versus stress-induced.  Further work-up per the emergency room.  In my opinion no further work-up is needed.  #Palpitations #SVT -SVT captured on monitor.  Reports of palpitations yesterday.  She also reports heart racing yesterday.  She states her migraines have worsened since starting diltiazem.  We will stop diltiazem.  Would recommend discharge with propranolol 10 mg twice daily.  We will arrange for her to be seen in the next 2 to 3 weeks in the office.  [place] will sign off.   Medication Recommendations: As above Other recommendations (labs, testing, etc): None. Follow up as an outpatient:  we will arrange hospital follow-up in 2 to 3 weeks  For questions or updates, please contact [place] Please consult [email or web address] for contact info under   Signed, [place] T. Audie Box, MD, [place]  04/22/2022 1:09 PM

## 2022-04-22 NOTE — Discharge Instructions (Addendum)
There is no evidence of heart attack or blood clot in the lung.  The cardiologist recommends stopping your Cardizem and starting propranolol.  Keep a close eye on the swelling in your arm from where the IV malfunctioned.  Keep the arm elevated and use warm compresses several times a day.  The swelling should go down over the next several days.  Return to the ED sooner if the swelling in your arm increases substantially in size, becomes red, hot, rock hard or has weakness, numbness, tingling, change in temperature or any oter concerns.   Also return to the ED with exertional chest pain, pain associate with shortness of breath, nausea, vomiting, sweating, any other concerns.

## 2022-04-22 NOTE — Progress Notes (Signed)
Sent staff msg to scheduling pool to call pt with appt info (had been instructed to send this way):  Hi schedulers, Dr. Audie Box asked me to help get this pt a follow-up appointment. It's scheduled for 05/06/22 with Jaquelyn Bitter but when I went to enter it on her discharge paperwork, she was already gone from the ER. Can you please call her to notify her of the appt info? Thank you! Malya Cirillo, [place] with our team.

## 2022-04-24 IMAGING — CR DG WRIST COMPLETE 3+V*L*
4 series · 4 of 4 positions shown · non-contrast
Comparison: 02/13/2018

CLINICAL DATA: Fell 03/24/2020, pain

EXAM:
LEFT HAND - COMPLETE 3+ VIEW; LEFT WRIST - COMPLETE 3+ VIEW

[x wrist pa left]
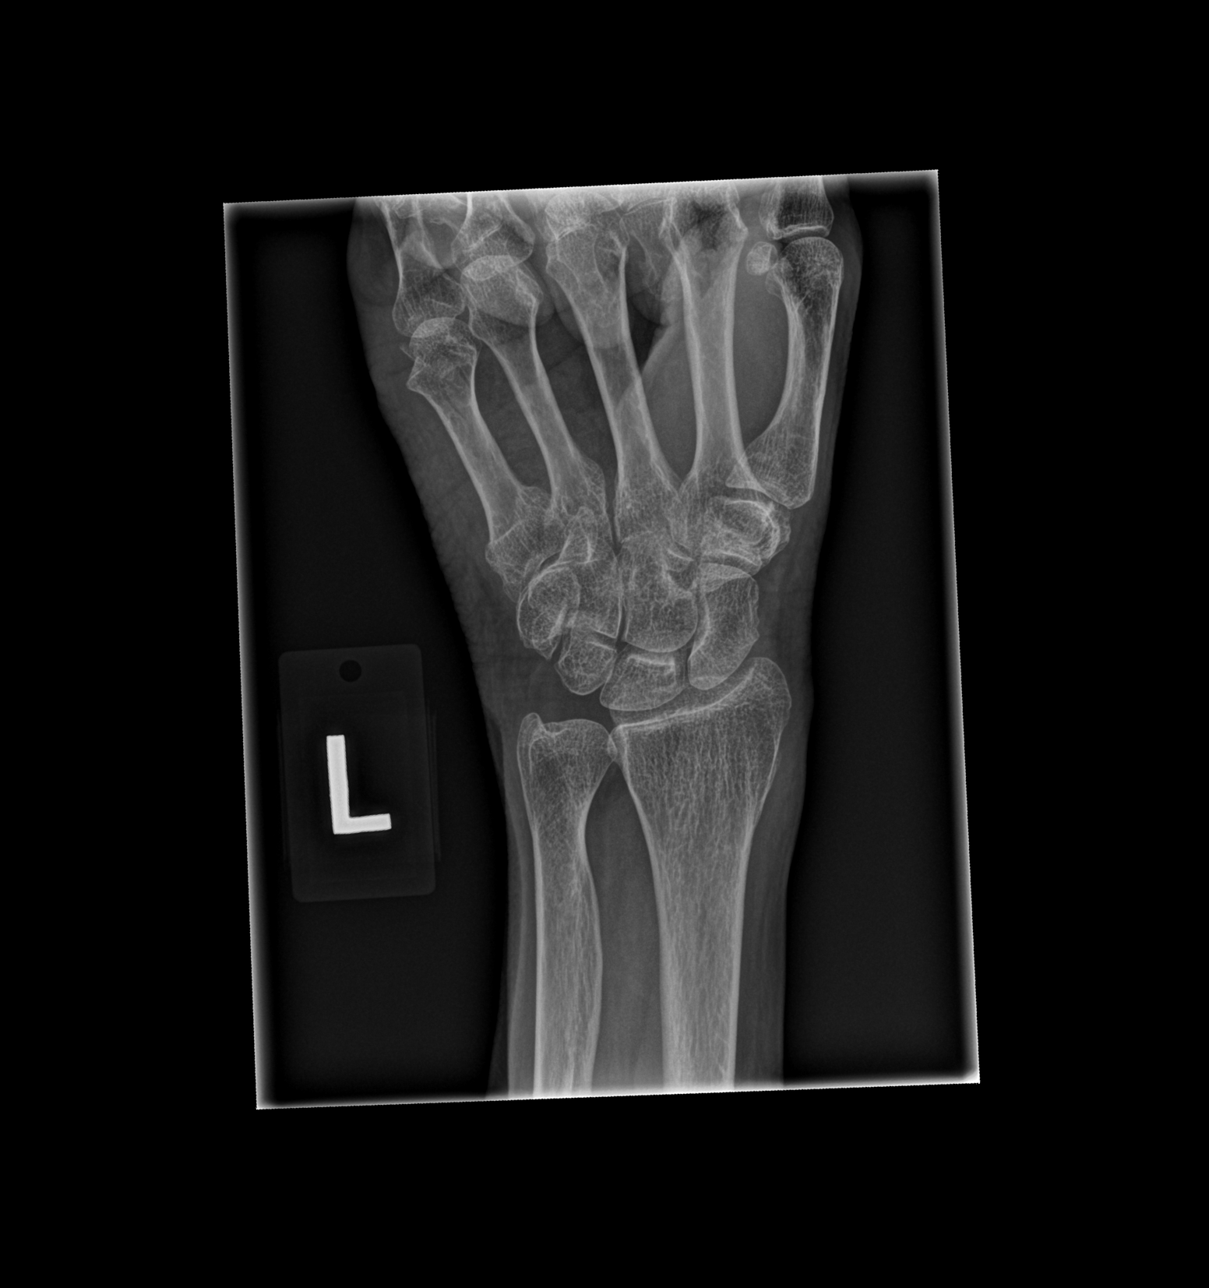

[x wrist obl left]
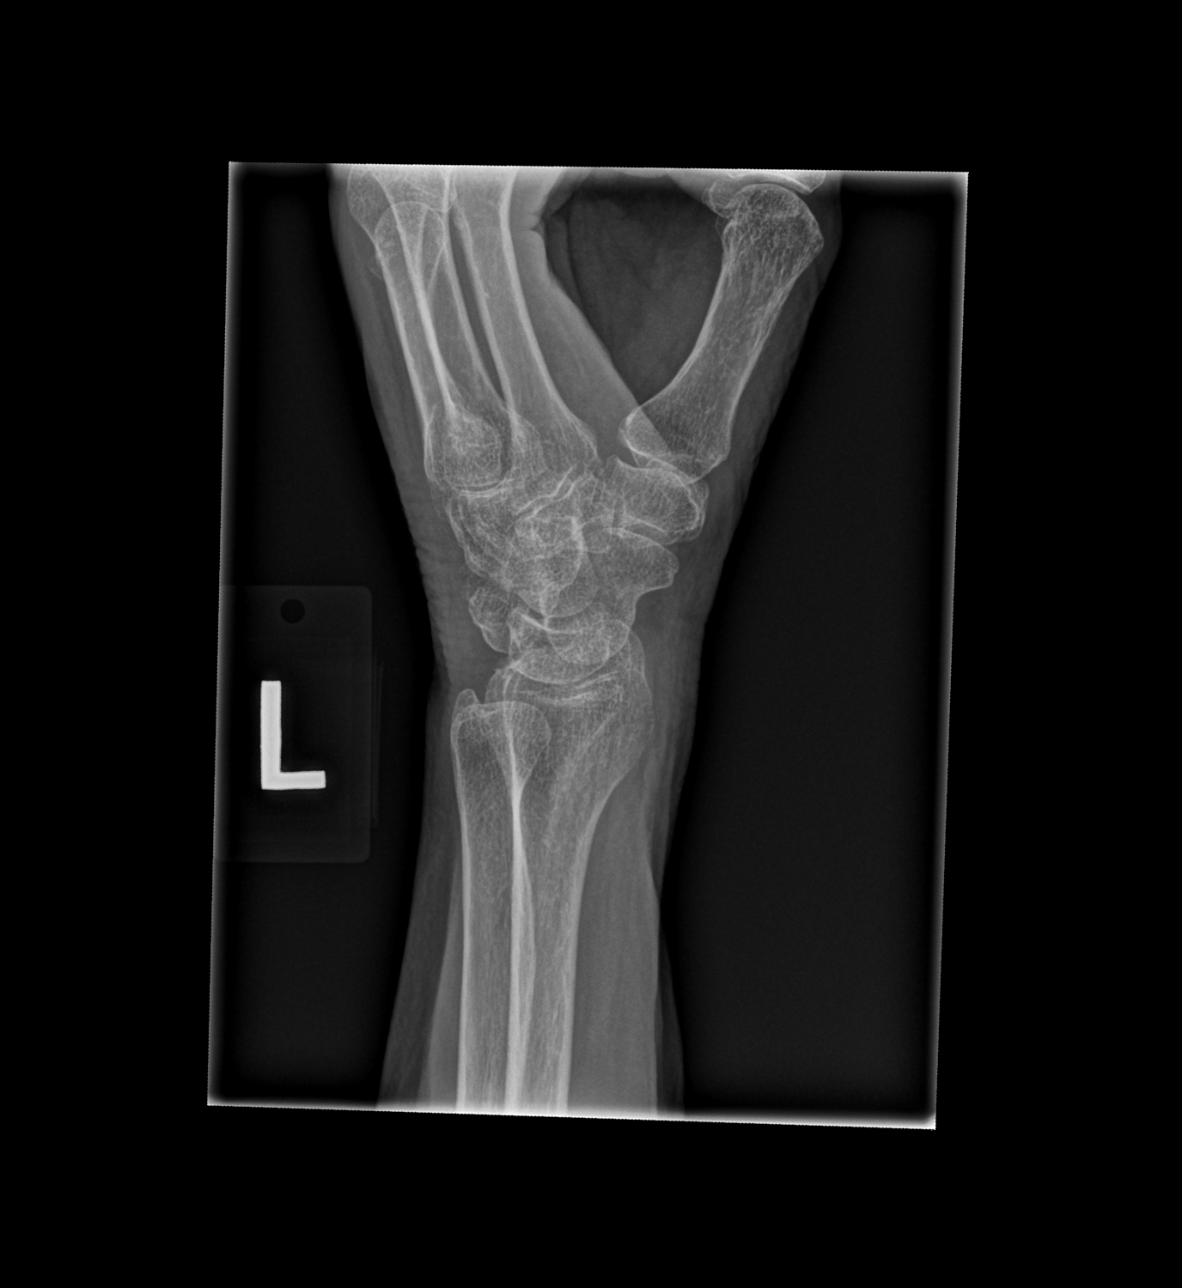

[x wrist lat left (1 of 2)]
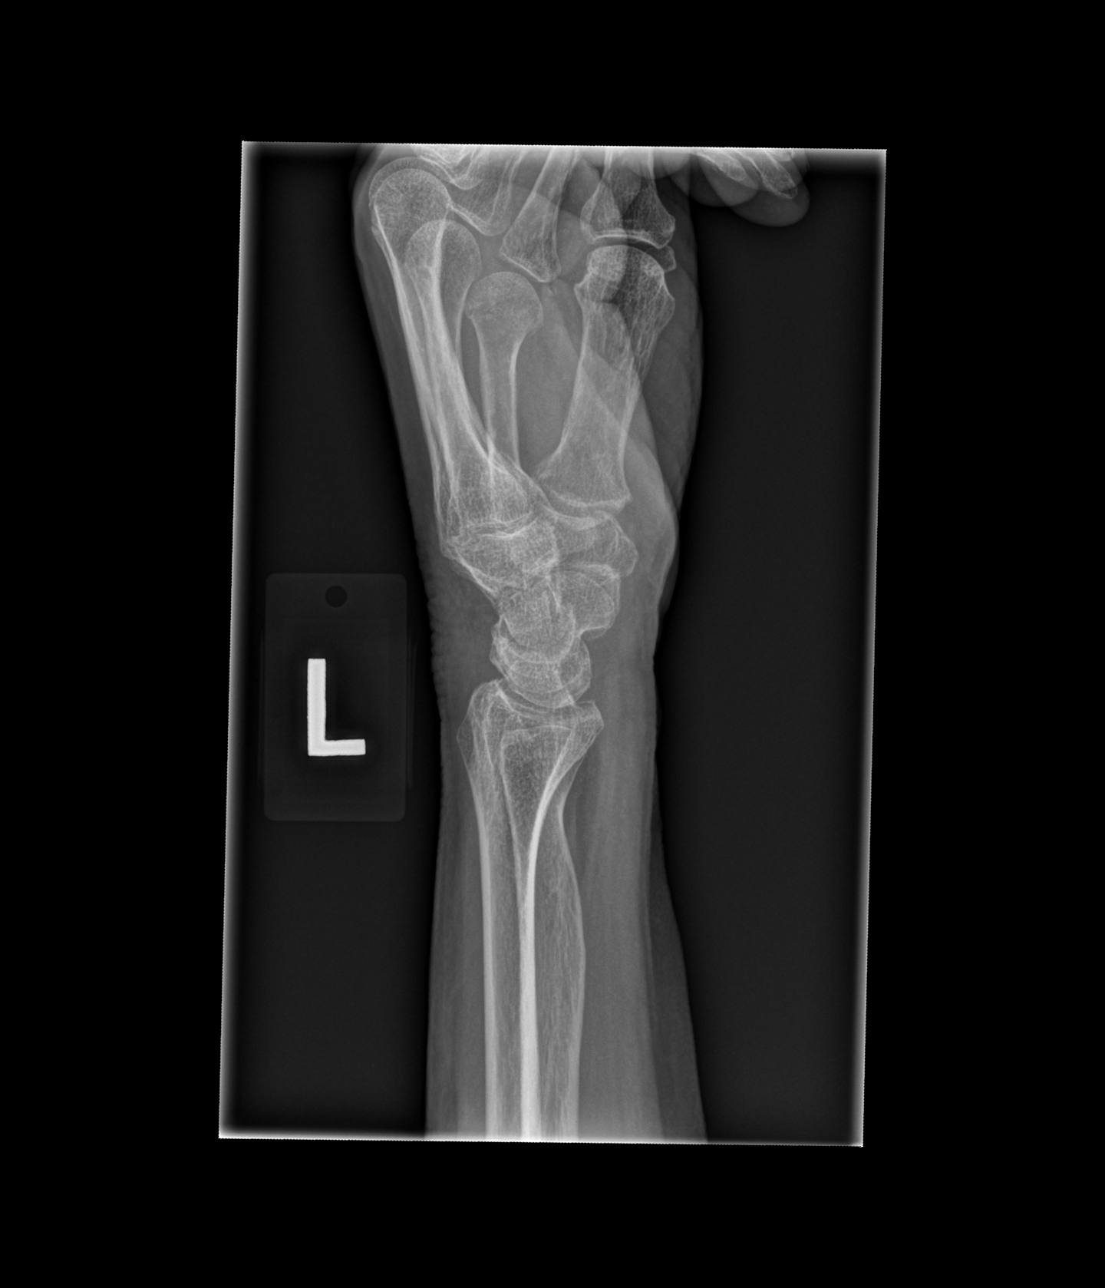

[x wrist lat left (2 of 2)]
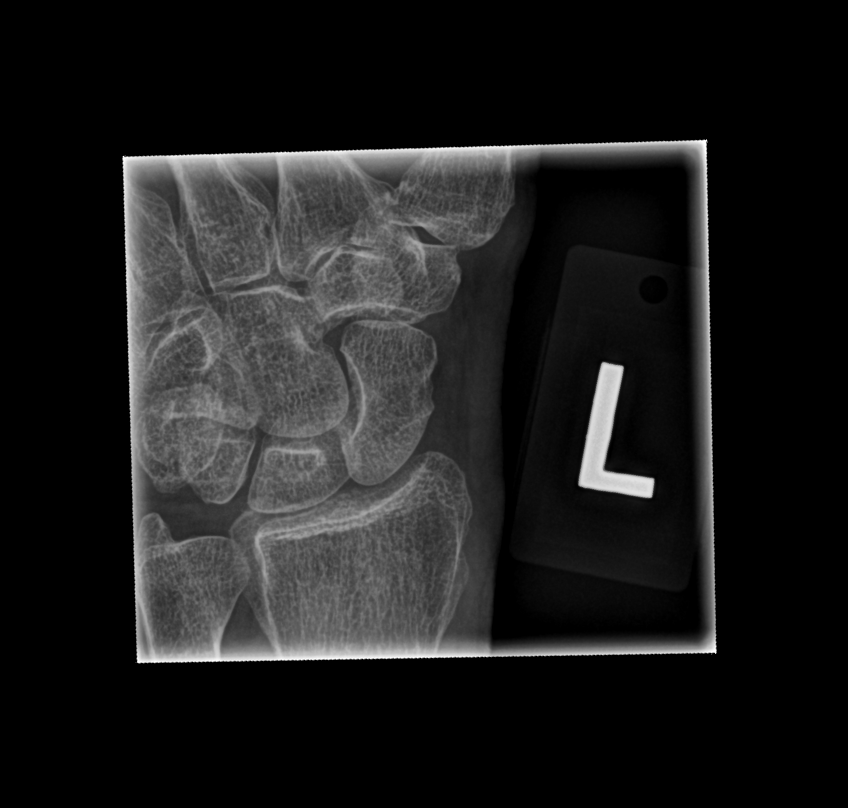

[4 of 4 positions shown; findings below may reference images not displayed]

FINDINGS: Left hand: Frontal, oblique, and lateral views are obtained. There
is a mildly comminuted intra-articular fracture involving the distal
aspect of the fifth metacarpal. No dislocation. Soft tissue swelling
within the dorsal ulnar aspect of the left hand. There is diffuse
osteoarthritis most pronounced throughout the distal interphalangeal
joints.

Left wrist: Frontal, oblique, lateral, and ulnar deviated views are
obtained. Comminuted fracture through the distal margin of the fifth
metacarpal is again identified. There are no other acute bony
abnormalities. Alignment is anatomic. Mild osteoarthritis at the
first carpometacarpal joint.
IMPRESSION: 1. Mildly comminuted intra-articular fracture distal margin fifth
metacarpal.
2. Osteoarthritis.

## 2022-04-25 DIAGNOSIS — I10 Essential (primary) hypertension: Secondary | ICD-10-CM | POA: Diagnosis not present

## 2022-04-25 DIAGNOSIS — R002 Palpitations: Secondary | ICD-10-CM | POA: Diagnosis not present

## 2022-04-25 DIAGNOSIS — G43019 Migraine without aura, intractable, without status migrainosus: Secondary | ICD-10-CM | POA: Diagnosis not present

## 2022-04-25 NOTE — Telephone Encounter (Signed)
Per documentation note on 04/22/22 from D. Dunn, PA-C, I called and left a VM for patient (DPR) of upcoming appointment with Ambrose Pancoast, NP on 05/06/22, 10:05 am.

## 2022-04-27 IMAGING — CR DG HUMERUS 2V *L*
2 series · 2 of 2 positions shown · non-contrast
Comparison: None.

CLINICAL DATA: Fall

EXAM:
LEFT HUMERUS - 2+ VIEW

[x humerus ap left]
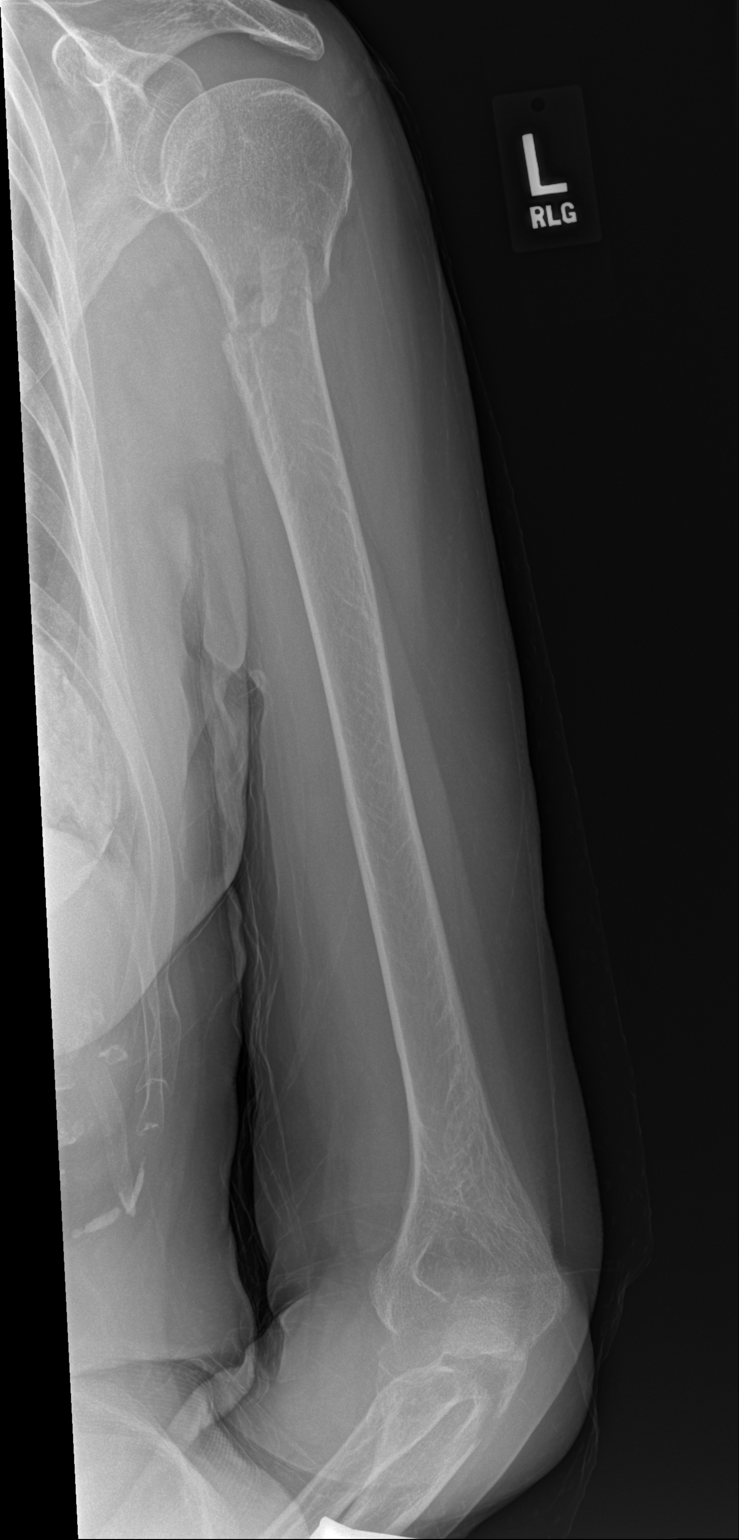

[w trans-thoracic humerus]
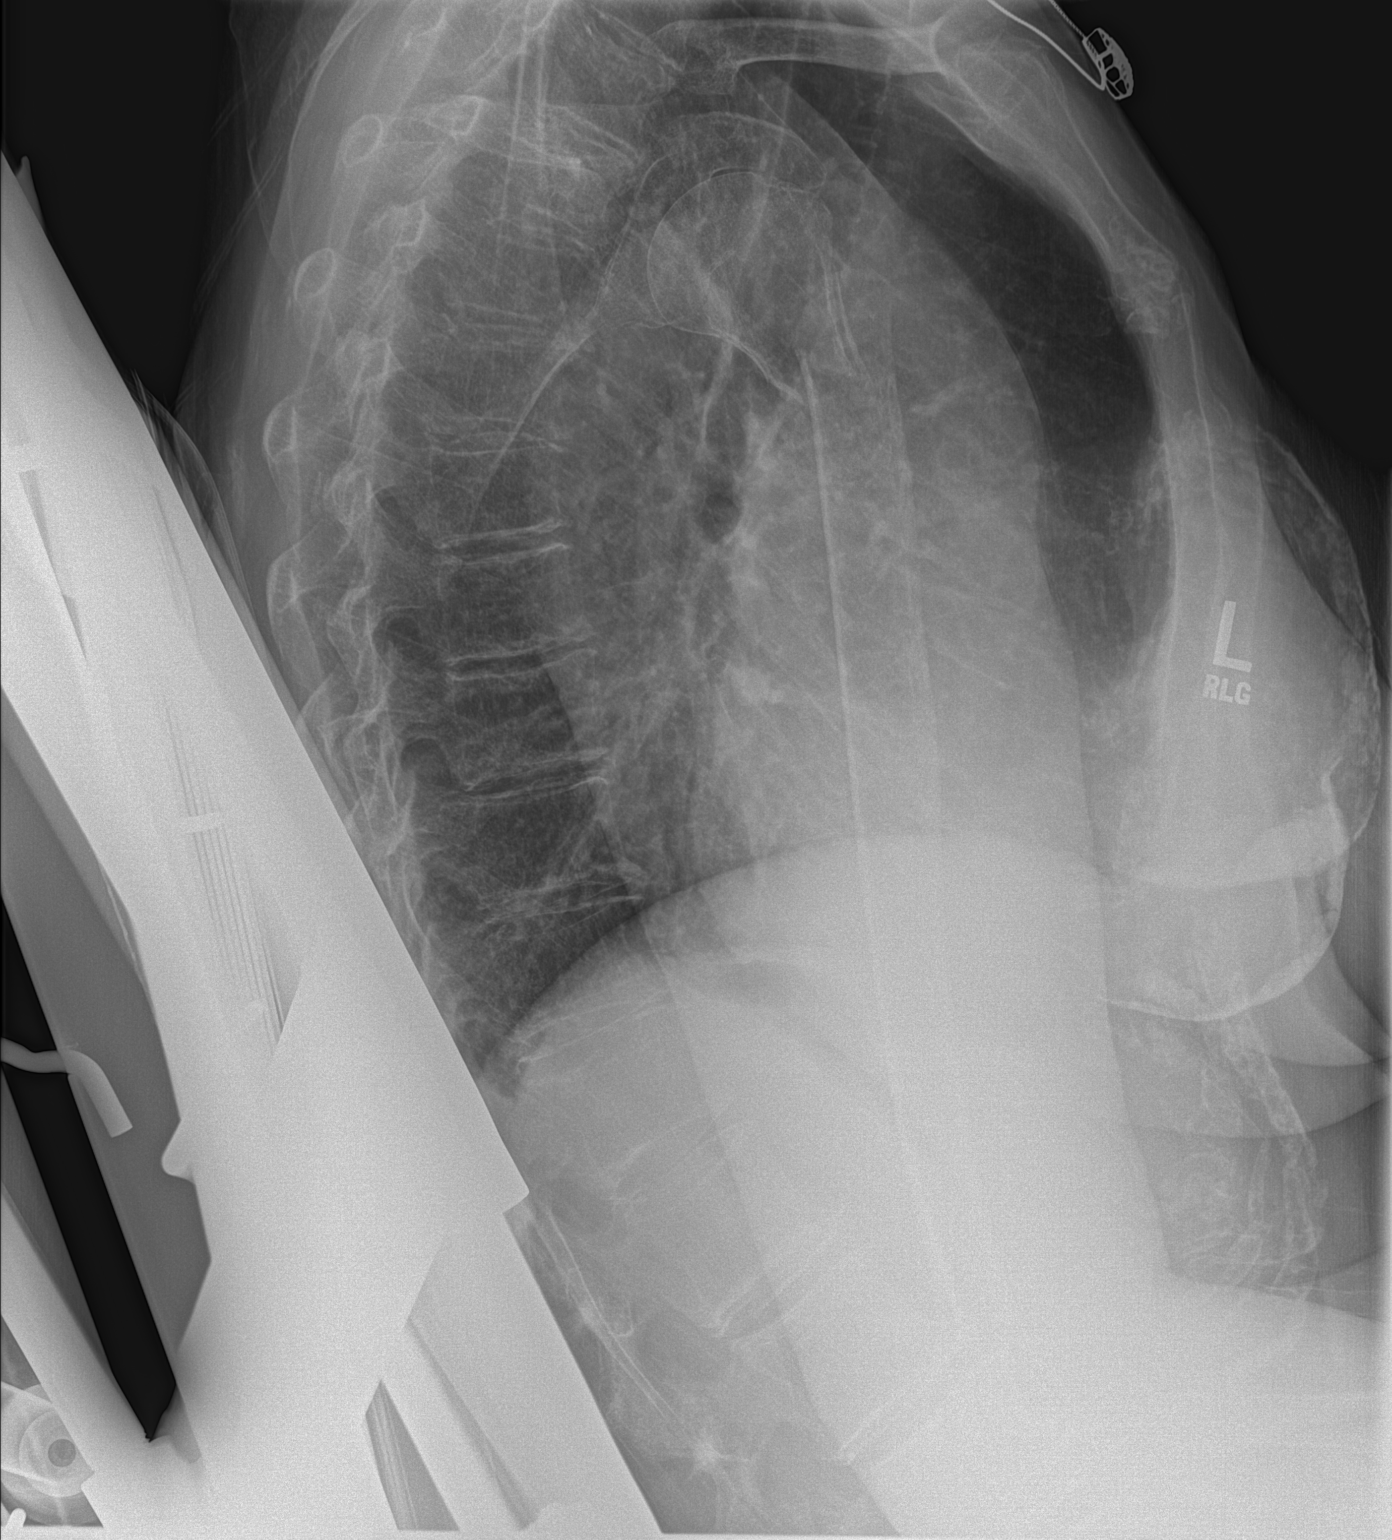

[2 of 2 positions shown; findings below may reference images not displayed]

FINDINGS: Anteromedially displaced proximal humeral shaft fracture. The
glenohumeral joint and elbow remain approximated. Diffuse soft
tissue swelling.

Comminuted olecranon fracture is better demonstrated on concurrent
forearm radiographs.
IMPRESSION: Anteromedially displaced proximal humeral shaft fracture.

Please see concurrent forearm radiographs for better
characterisation of comminuted olecranon fracture.

## 2022-04-27 IMAGING — CR DG FOREARM 2V*L*
5 series · 5 of 5 positions shown · non-contrast
Comparison: None.

CLINICAL DATA: Patient fell down stairs.  Left arm pain.

EXAM:
LEFT FOREARM - 2 VIEW

[x forearm ap left (1 of 3)]
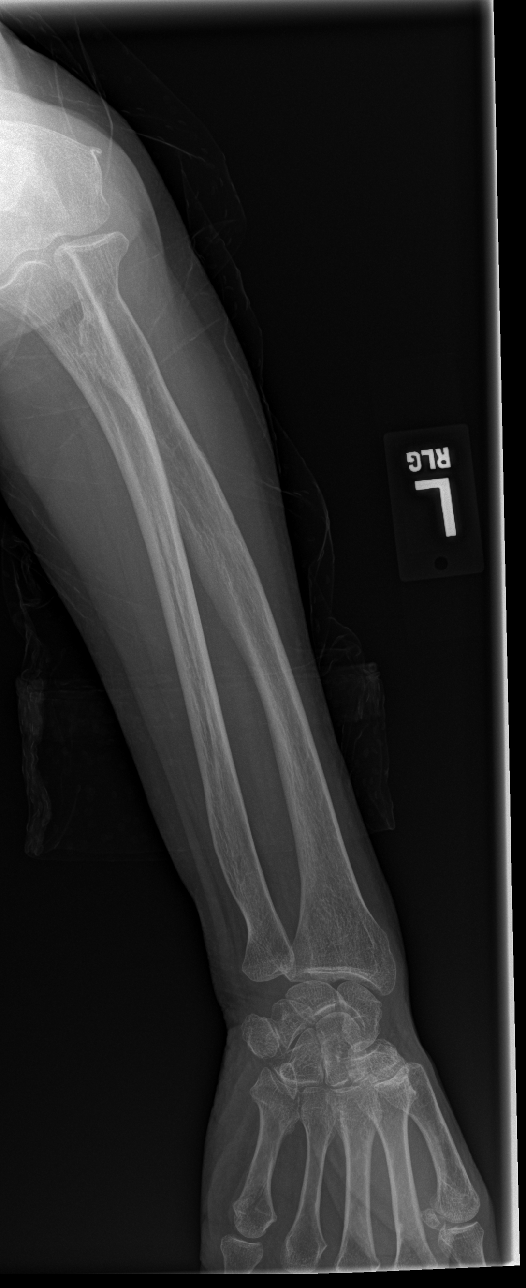

[x forearm ap left (2 of 3)]
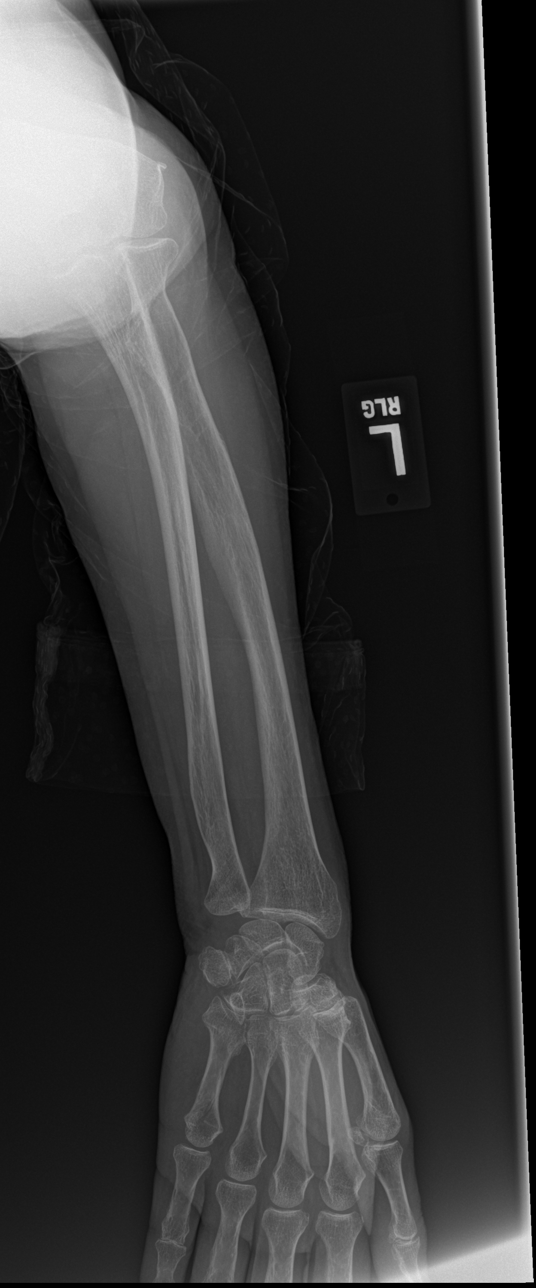

[x forearm ap left (3 of 3)]
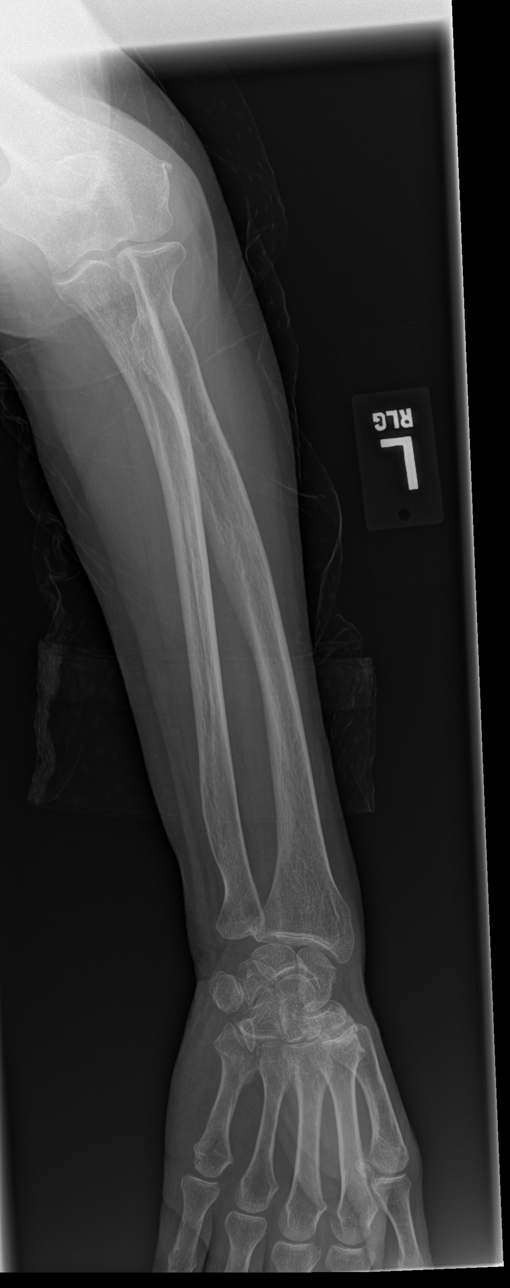

[x forearm lat left (1 of 2)]
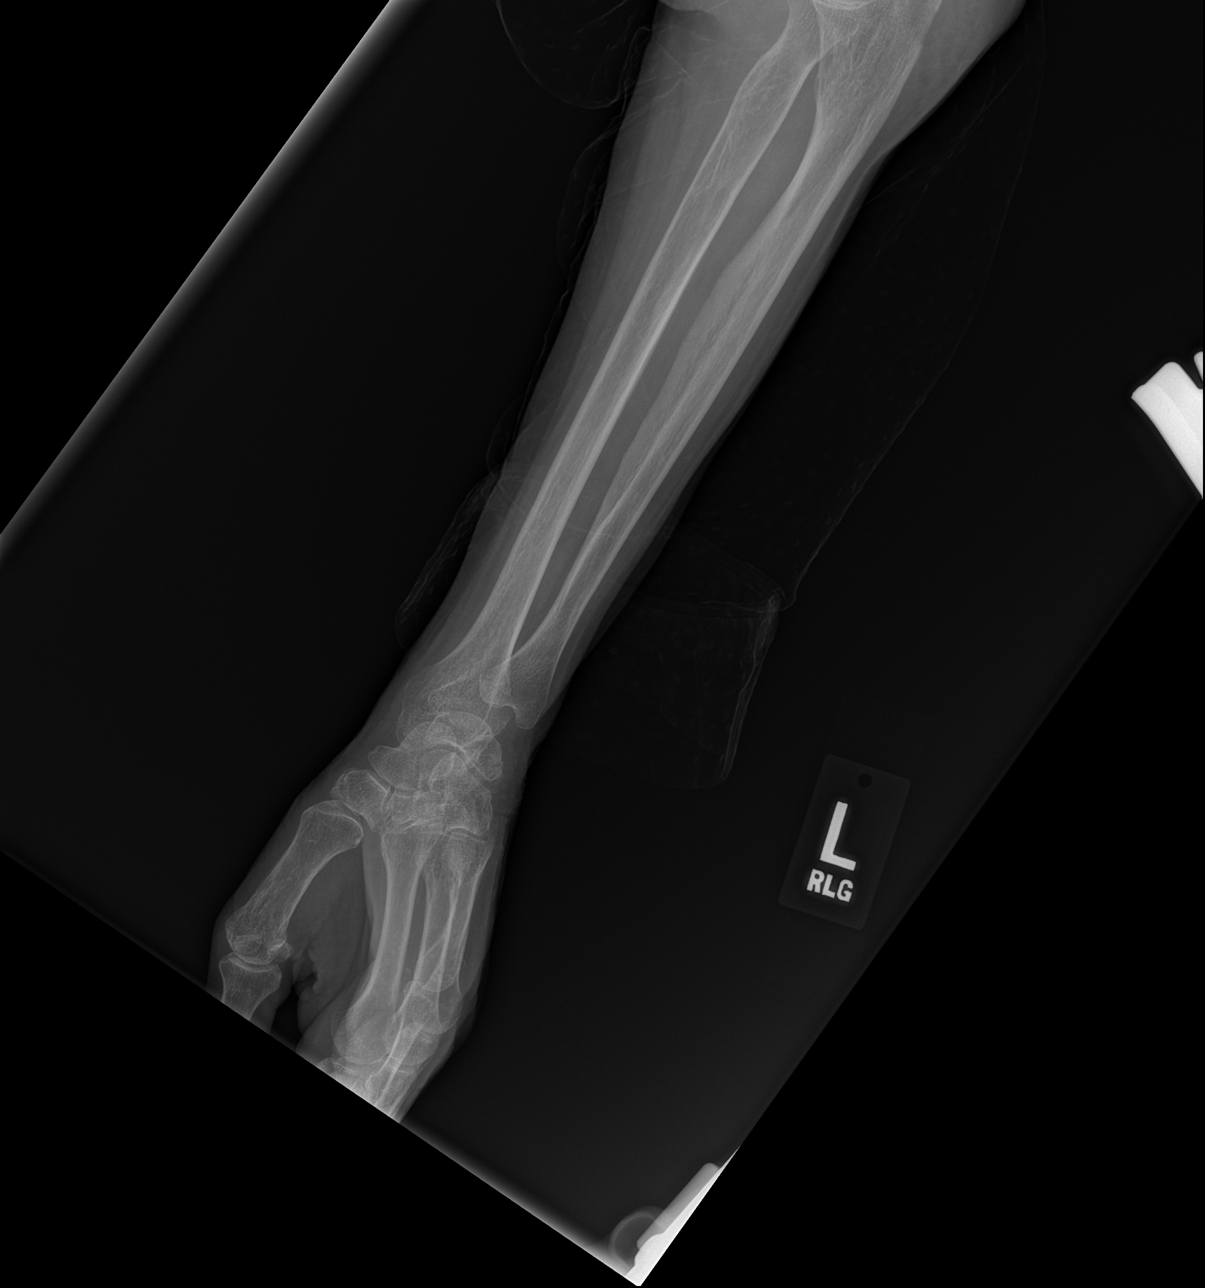

[x forearm lat left (2 of 2)]
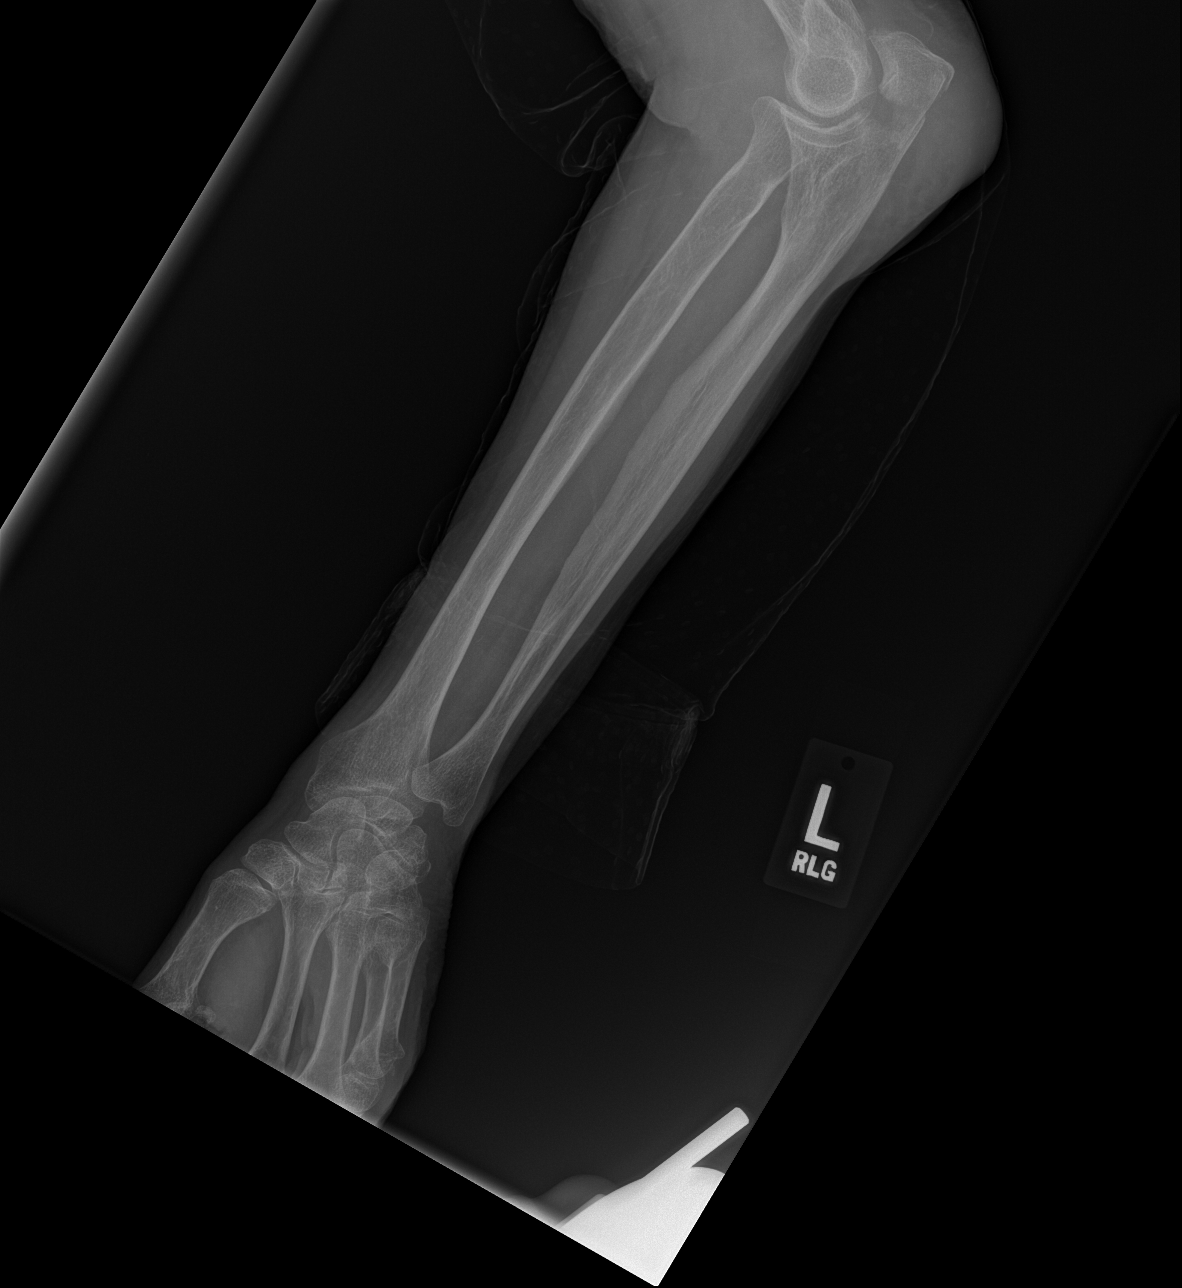

[5 of 5 positions shown; findings below may reference images not displayed]

FINDINGS: Two-view exam shows diffuse demineralization. Proximal ulnar
fracture noted at the level of the elbow, apparently isolating the
olecranon as a free fragment. No evidence for an associated radius
fracture.
IMPRESSION: Proximal ulnar fracture apparently isolating the olecranon as a free
fragment. Dedicated elbow films recommended.

## 2022-04-27 IMAGING — CR DG ELBOW COMPLETE 3+V*L*
4 series · 4 of 4 positions shown · non-contrast
Comparison: Forearm evaluation of 03/29/2020

CLINICAL DATA: Elbow fracture, fall down stairs at home.

EXAM:
LEFT ELBOW - COMPLETE 3+ VIEW

[x elbow ap left]
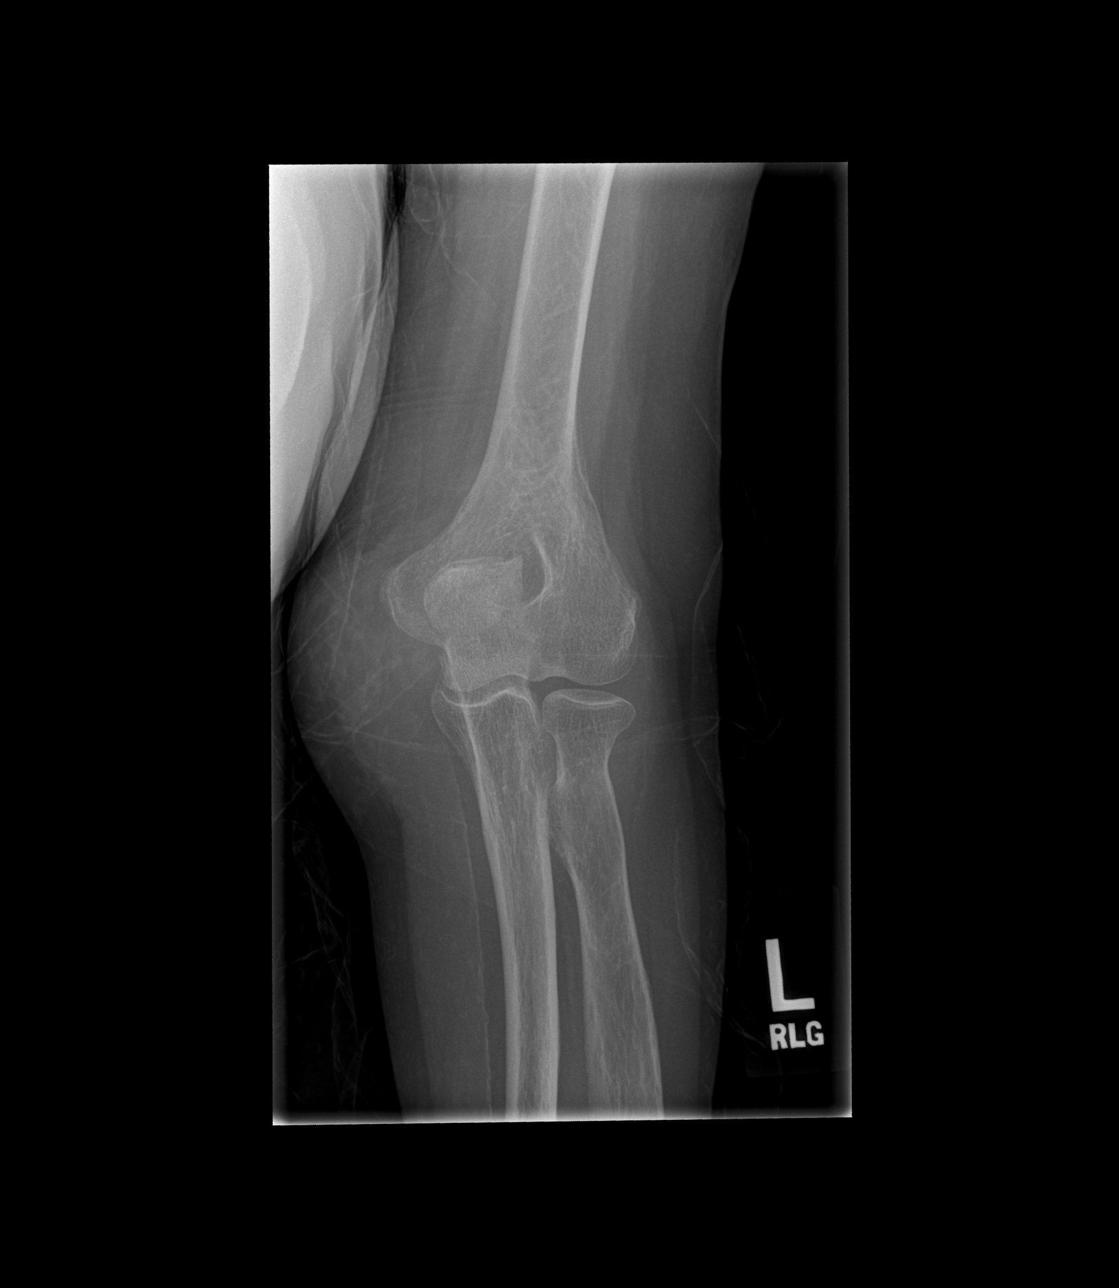

[x elbow obl left (1 of 2)]
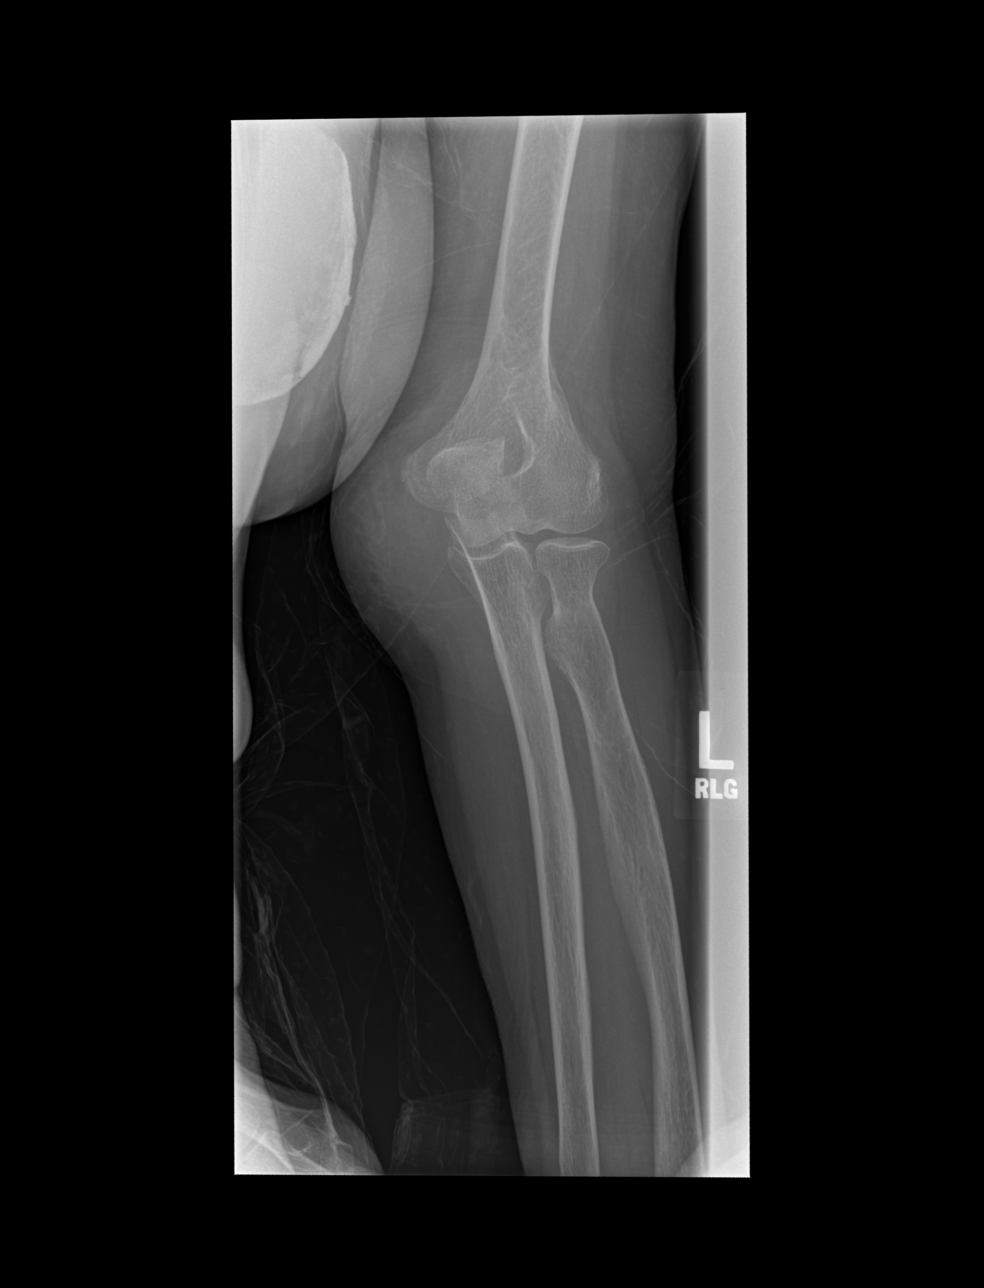

[x elbow obl left (2 of 2)]
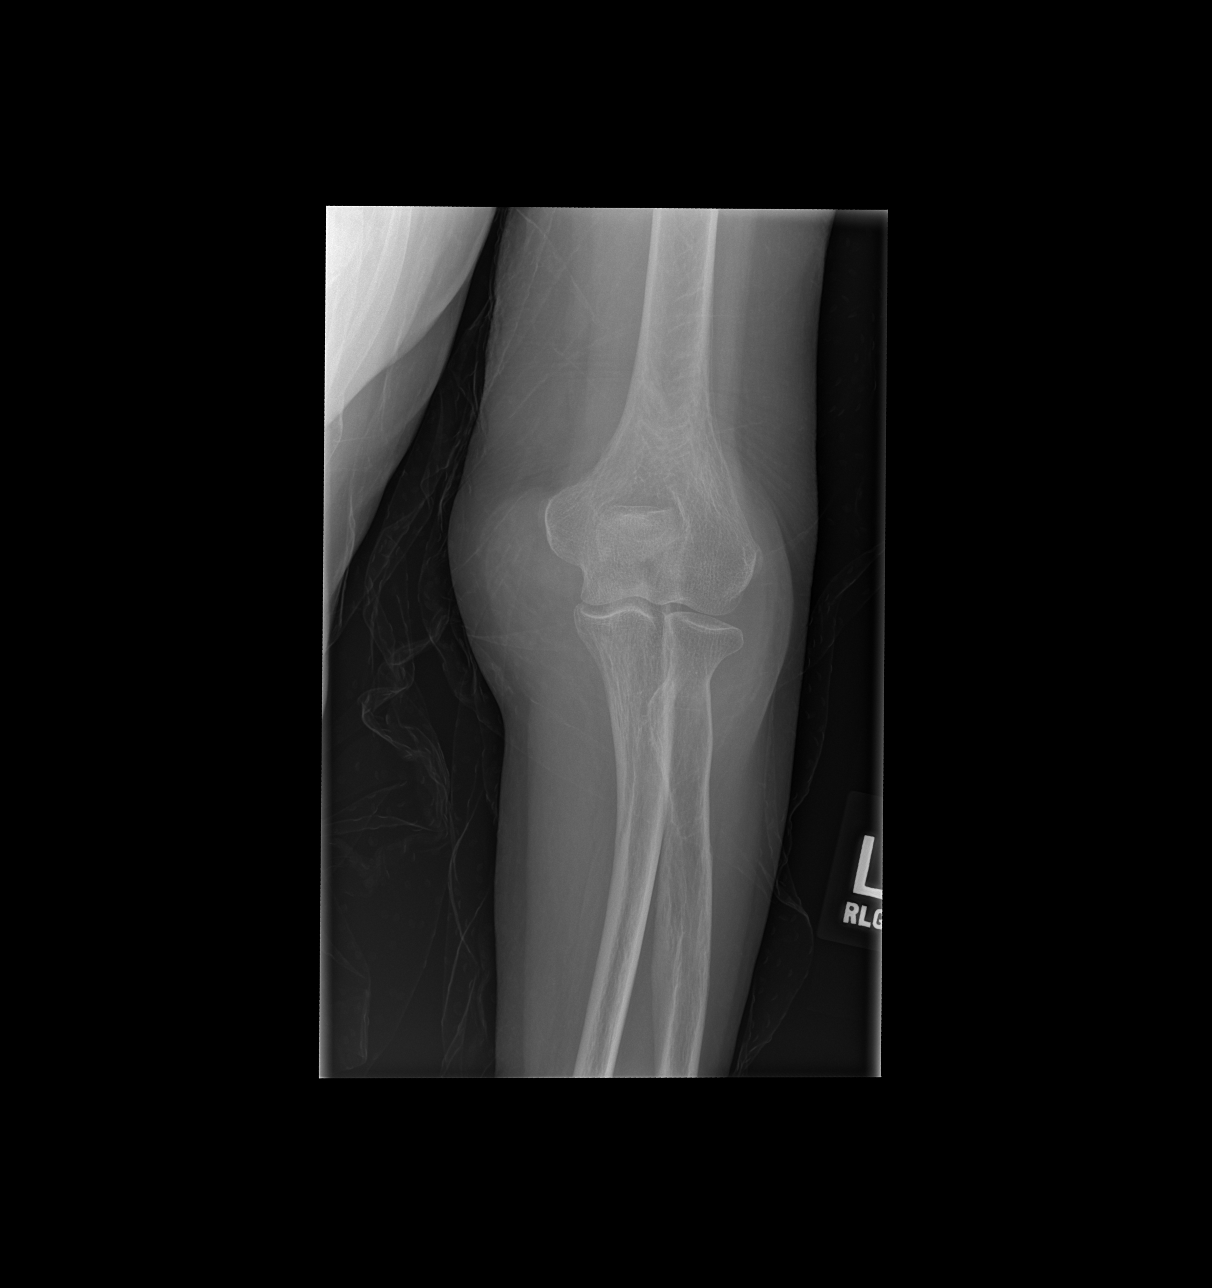

[x elbow lat left]
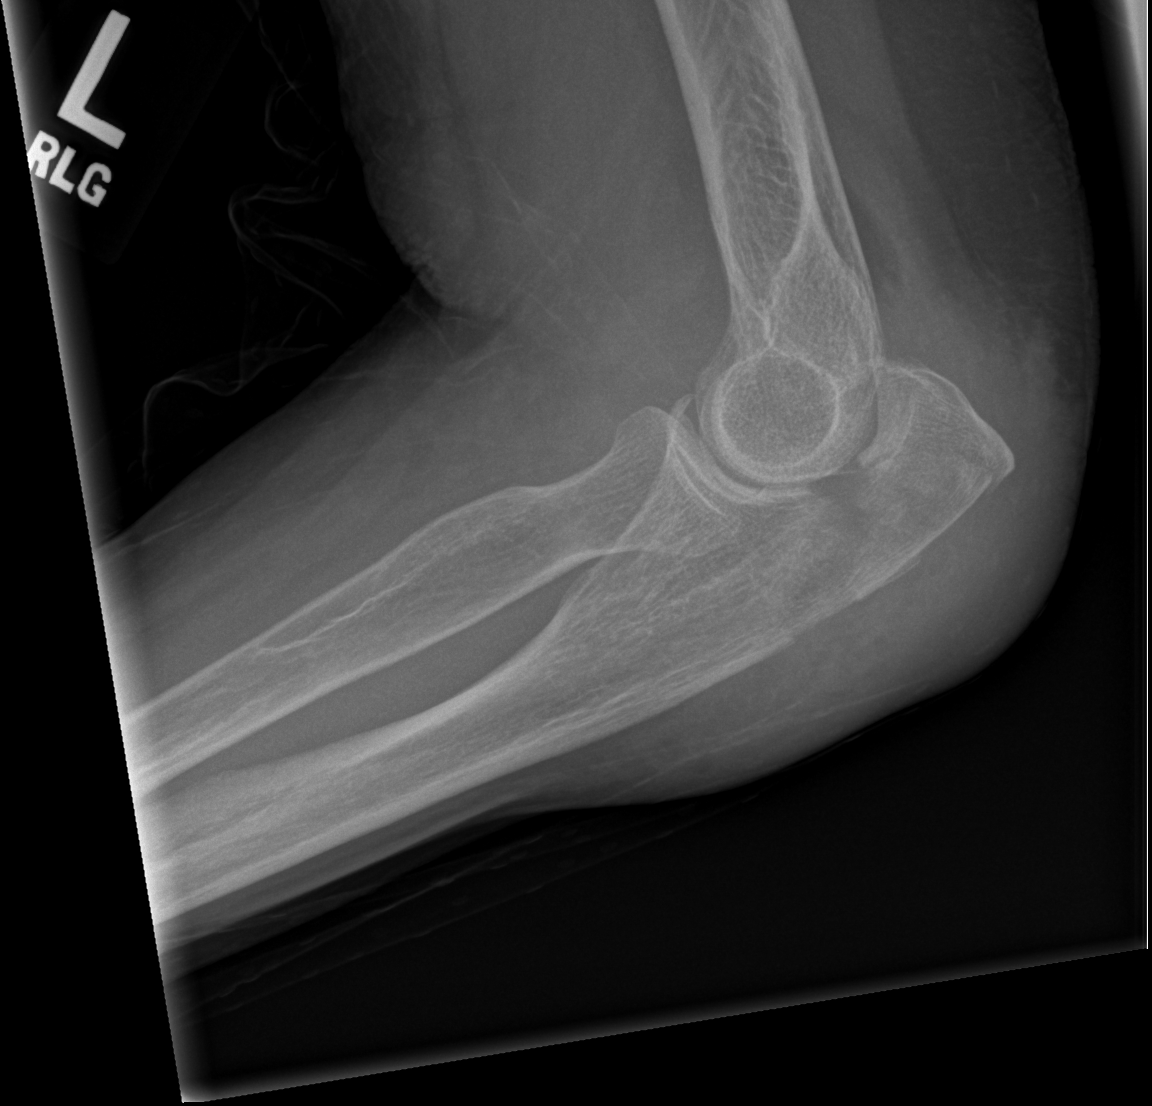

[4 of 4 positions shown; findings below may reference images not displayed]

FINDINGS: Displaced olecranon fracture with mild comminution, multipart
fragmentation of the proximal fracture fragment, approximately 6 mm
retraction of the proximal fracture fragments with soft tissue
swelling over the dorsum of the elbow, associated with posterior fat
pad. Fracture is intra-articular. No visible coronoid process
fracture.

Radiocapitellar relationships are maintained. No sign of radial head
fracture.

No additional fracture
IMPRESSION: Displaced olecranon fracture with soft tissue swelling over the
dorsum of the comminuted proximal fracture fragment that is
retracted/displaced approximately 6 mm. Orthopedic consultation is
suggested.

## 2022-04-27 IMAGING — CR DG SHOULDER 2+V*L*
4 series · 4 of 4 positions shown · non-contrast
Comparison: None.

CLINICAL DATA: Fall.  Left shoulder pain.

EXAM:
LEFT SHOULDER - 2+ VIEW

[x shoulder ap left (1 of 3)]
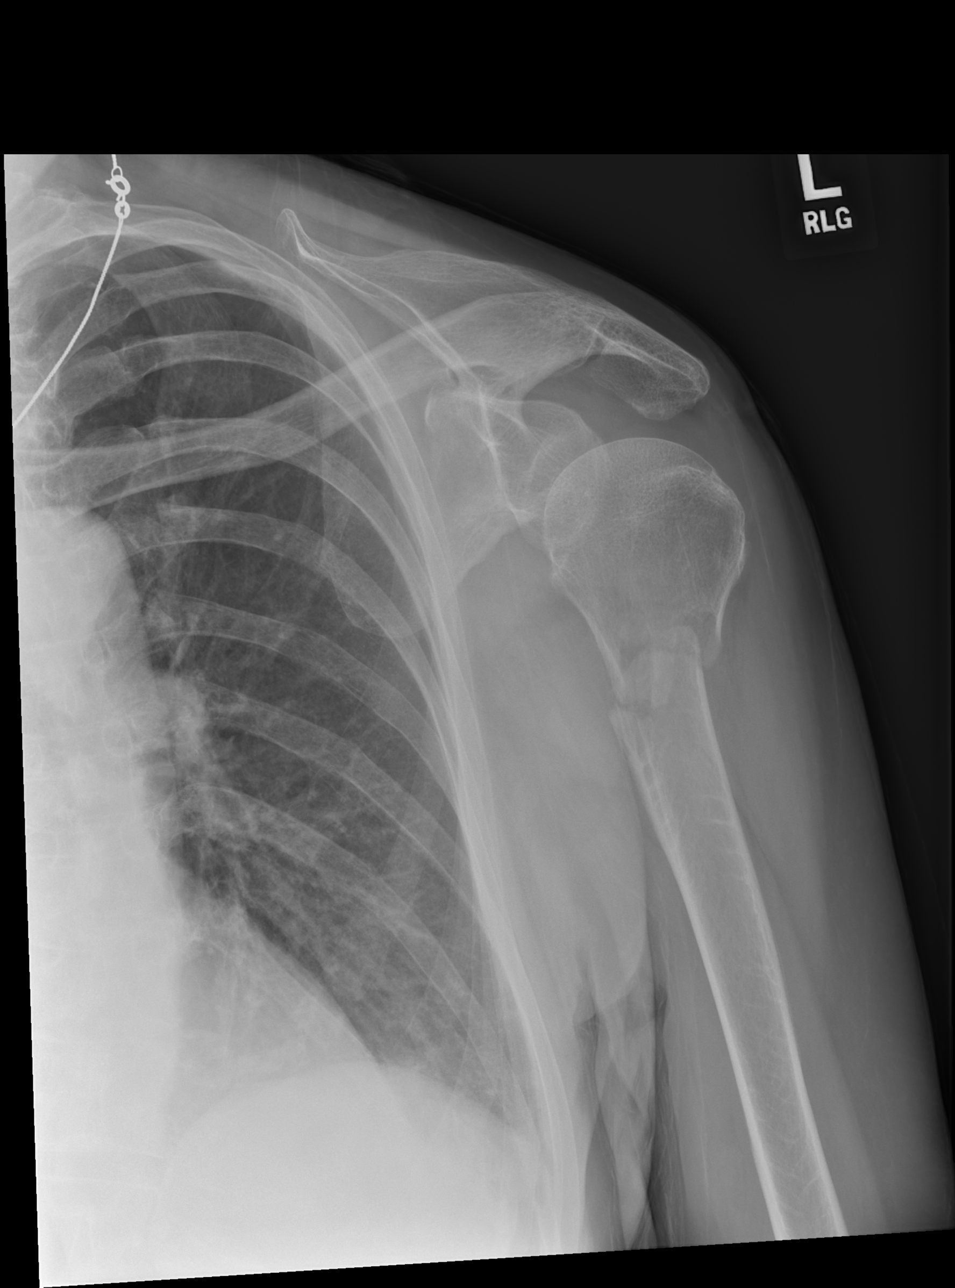

[x shoulder ap left (2 of 3)]
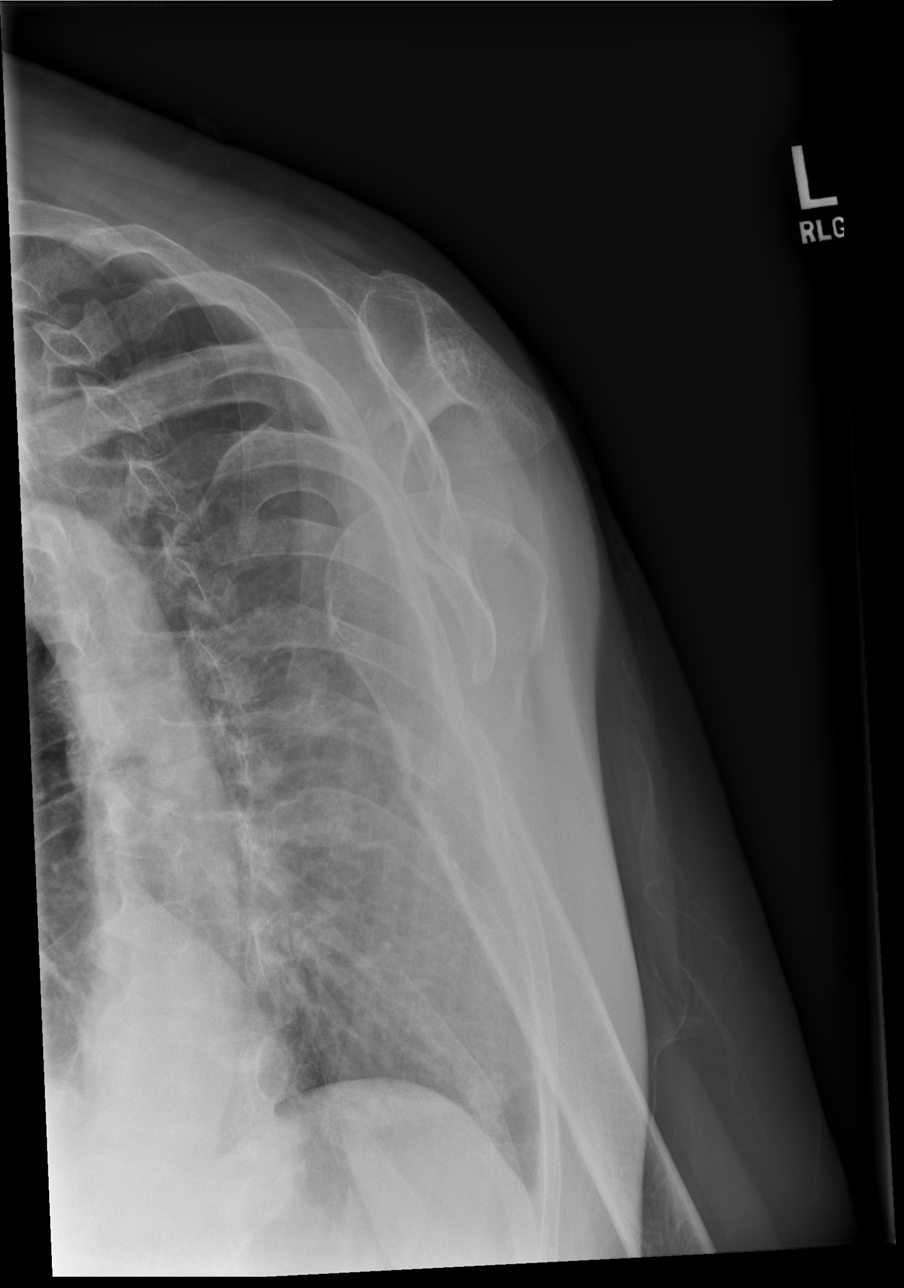

[x shoulder ap left (3 of 3)]
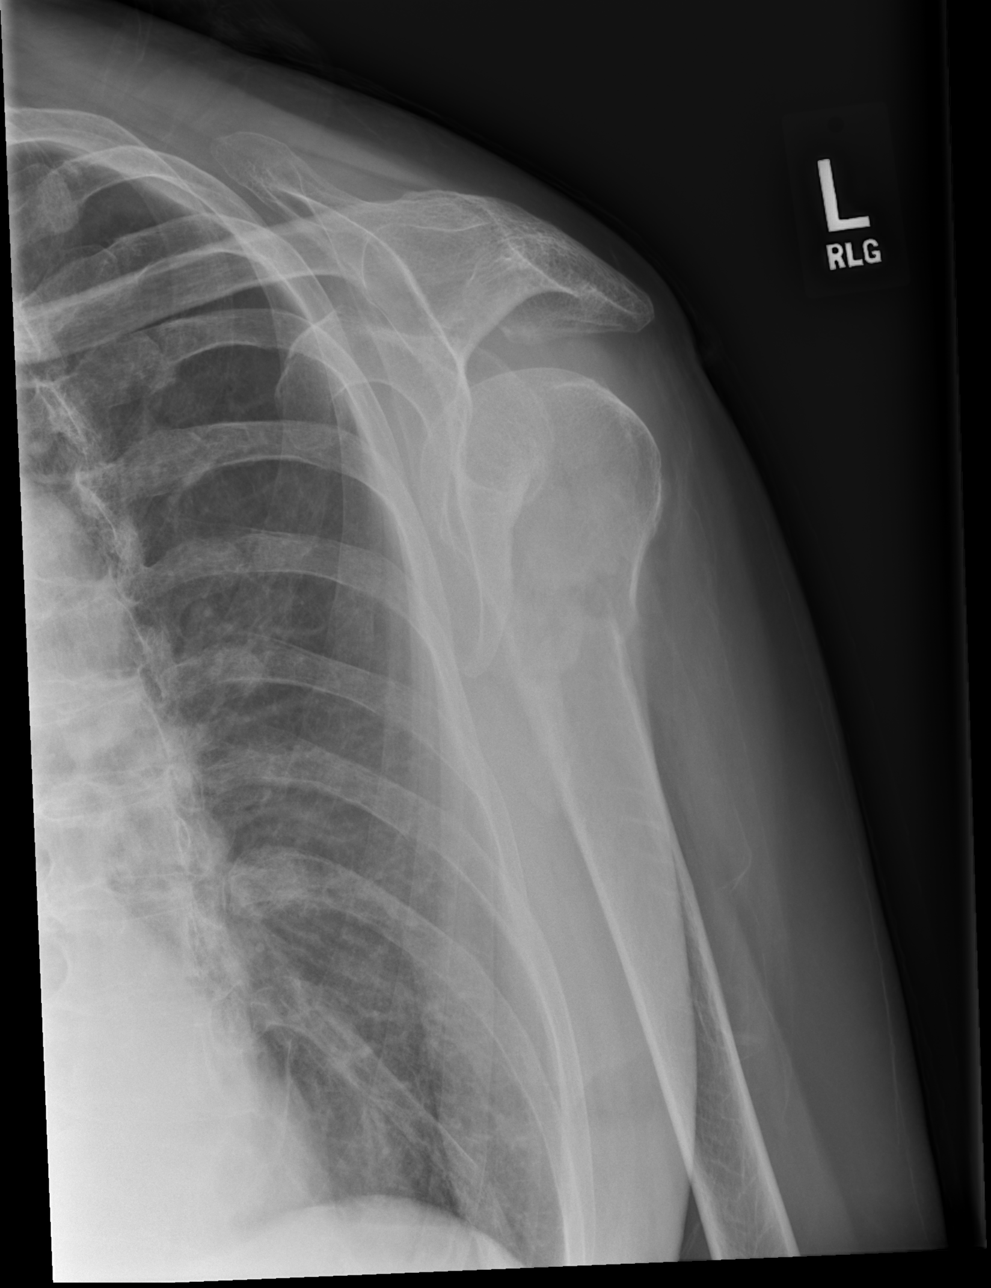

[w trans-thoracic humerus]
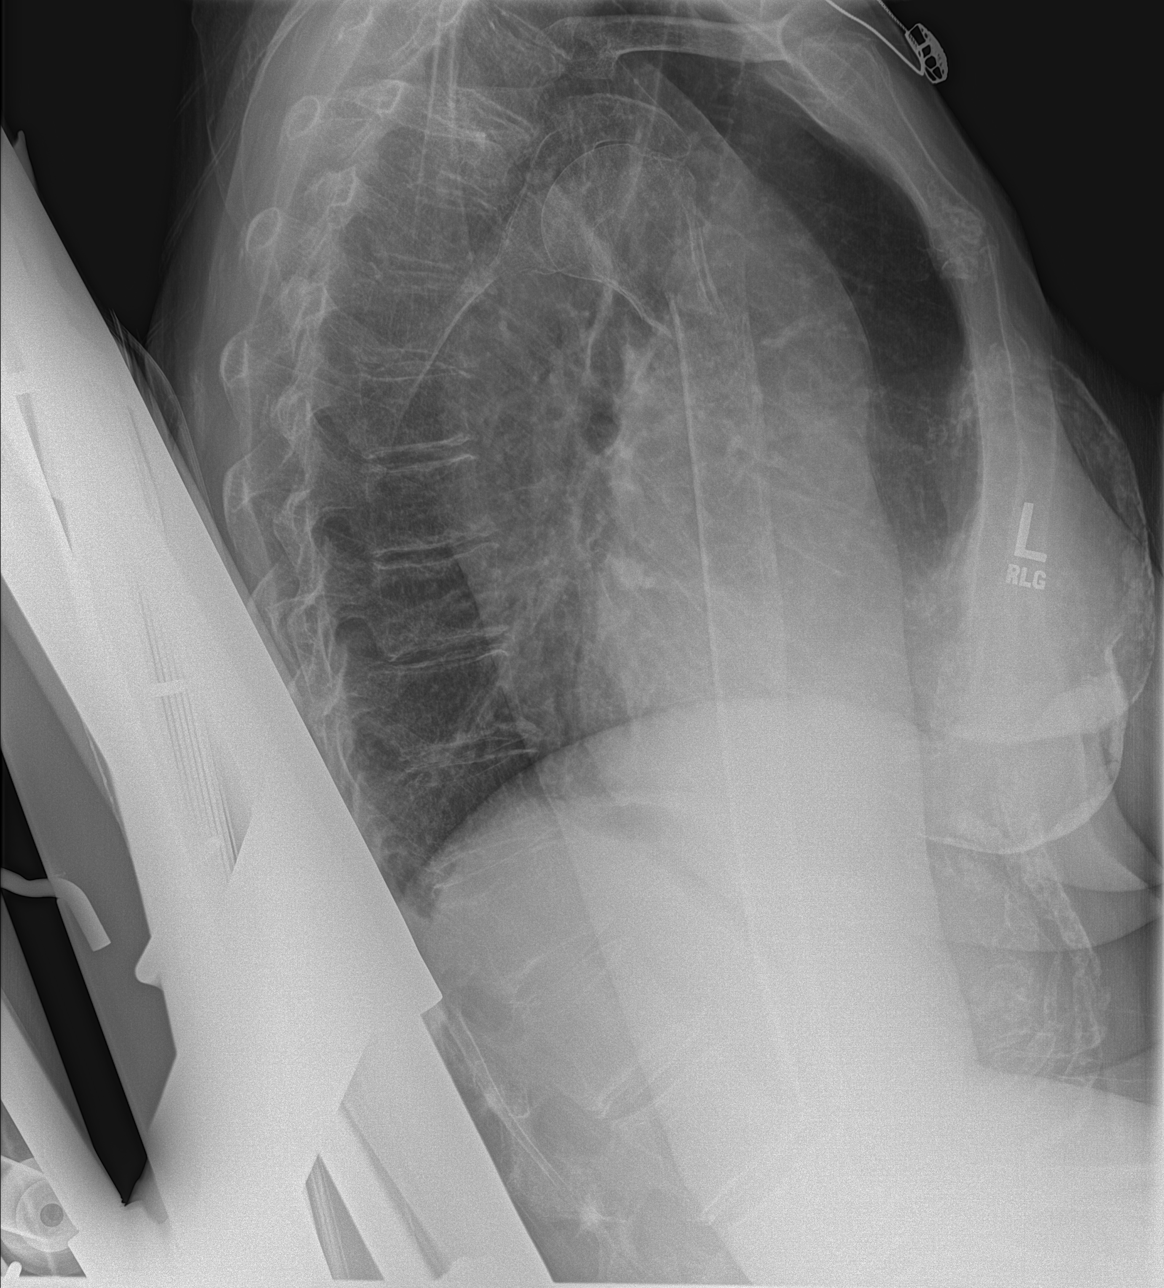

[4 of 4 positions shown; findings below may reference images not displayed]

FINDINGS: Two view exam of the left shoulder shows a transverse fracture of
the proximal humerus, in the region of the surgical neck. Lateral
film documents apex anterior angulation. Bones are diffusely
demineralized.
IMPRESSION: Transverse fracture of the proximal humerus in the region of the
surgical neck.

## 2022-05-02 NOTE — Progress Notes (Deleted)
Office Visit    Patient Name: Dorothy White Date of Encounter: 05/02/2022  Primary Care Provider:  Jonathon Jordan, MD Primary Cardiologist:  Early Osmond, MD Primary Electrophysiologist: None  Chief Complaint    Dorothy White is a 71 y.o. female with PMH of HLD, TIA (2014), NICM, CAD (nonobstructive 2017), tobacco abuse, palpitations who presents today for follow-up of recent ED visit for chest pain.  Past Medical History    Past Medical History:  Diagnosis Date   Anemia    ?   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 25% mLAD, otherwise no sig disease 01/2016 cath.   NICM (nonischemic cardiomyopathy) ([place])    a. EF 40-45% by echo 11/1882 at time of complicated migraine/neuro sx, 55-65% at time of cath 01/2016   Stroke [facility]) 06/2013   "mini" stroke , ?possibly hemaplegic migraine per pt   Past Surgical History:  Procedure Laterality Date   ANTERIOR APPROACH HEMI HIP ARTHROPLASTY Left 04/01/2017   Procedure: LEFT DIRECT ANTERIOR TOTAL HIP REPLACEMENT;  Surgeon: Leandrew Koyanagi, MD;  Location: [place];  Service: Orthopedics;  Laterality: Left;  LEFT DIRECT ANTERIOR TOTAL HIP REPLACEMENT   AUGMENTATION MAMMAPLASTY  1980   BASAL CELL CARCINOMA EXCISION     "tip of my nose"   BUNIONECTOMY Bilateral 10/2003   CARDIAC CATHETERIZATION N/A 02/20/2016   Procedure: Left Heart Cath and Coronary Angiography;  Surgeon: Jettie Booze, MD;  Location:  [place] [facility];  Service: Cardiovascular;  Laterality: N/A;   COLONOSCOPY     DILATION AND CURETTAGE OF UTERUS     FRACTURE SURGERY     IR GENERIC HISTORICAL  03/18/2016   IR SINUS/FIST TUBE CHK-NON GI 03/18/2016 Aletta Edouard, MD [facility]   LAPAROSCOPIC SIGMOID COLECTOMY N/A 04/25/2016   Procedure: LAPAROSCOPIC SIGMOID COLECTOMY;  Surgeon: Stark Klein, MD;  Location: [place];  Service: General;  Laterality: N/A;   OOPHORECTOMY Bilateral ~ 1999   ORIF HUMERUS FRACTURE Right 02/15/2015   Procedure: OPEN REDUCTION INTERNAL FIXATION (ORIF) PROXIMAL HUMERUS FRACTURE;  Surgeon: Netta Cedars, MD;  Location: [place];  Service: Orthopedics;  Laterality: Right;   ORIF HUMERUS FRACTURE Left 04/04/2020   ORIF HUMERUS FRACTURE Left 04/04/2020   Procedure: OPEN REDUCTION INTERNAL FIXATION (ORIF) PROXIMAL HUMERUS FRACTURE;  Surgeon: Iran Planas, MD;  Location: [place];  Service: Orthopedics;  Laterality: Left;  regional block   ORIF ULNAR FRACTURE Left 04/04/2020   ORIF ULNAR FRACTURE Left 04/04/2020   Procedure: OPEN REDUCTION INTERNAL FIXATION (ORIF) ULNAR FRACTURE;  Surgeon: Iran Planas, MD;  Location: [place];  Service: Orthopedics;  Laterality: Left;  with regional block   [place] Right 08/11/2020   Procedure: TOTAL HIP ARTHROPLASTY ANTERIOR APPROACH;  Surgeon: Rod Can, MD;  Location: [facility];  Service: Orthopedics;  Laterality: Right;   TUBAL LIGATION  ~ 1983   VAGINAL HYSTERECTOMY  ~ 1998    Allergies  Allergies  Allergen Reactions   Opana [Oxymorphone Hcl] Other (See Comments)    hallucinations    History of Present Illness    Dorothy White  is a 71 year old female with the above mention past medical history who presents today for complaint of dyspnea and palpitations.  She was initially seen by Dr. [name] in 11/2015 for palpitations and reduced EF noted to be 40-45%.  She wore a 30-day event monitor that showed no atrial  fibrillation or arrhythmias.  She was seen in follow-up 01/2016 with complaint of orthostatic hypotension.  She also reported SOB and substernal chest pain.  She underwent nuclear stress test that showed medium defect of moderate severity with T wave inversions in inferolateral leads.  She had LHC performed that revealed nonobstructive CAD and EF of 55-65%.  She was seen in 2018 for complaint of chest pain and dyspnea on exertion.  She endorsed twinge in her chest and was noted to have abnormal EKG with nonspecific ST changes and subtle sagging in lead II, 3, aVF with flattened T waves in V4-V6.  Troponins were negative. Carotid U/S completed and revealed 40-59% stenosis in distal left internal carotid artery, right carotid artery 1-39% stenosis.  2D echo completed noting EF 60-65% with mild basal septal hypertrophy and no RWMA.   Patient was lost to follow-up until seen 11/2021 by Dr. [name] for complaint of palpitations and dyspnea.  Patient endorsed shortness of breath with exertion when bending over.  Patient also noted to have palpitations and peripheral edema that responded to Lasix.  She was referred for cardiac CTA that was completed 03/12/2022 with mildly elevated PA pressures and nonobstructive CAD.  Exam did note aortic atherosclerosis.  Event monitor was also placed that revealed 19 SVT runs with no atrial fibrillation or sustained VT ventricular tachycardia.  Patient was seen in follow-up on 04/10/2022 and was started on Cardizem 120 mg.  2D echo completed in 12/2021 showed normal RV function with mild PA systolic pressure.  During follow-up on 04/18/2022 patient endorsed continued palpitations and her Cardizem was increased to 240 mg daily.  She contacted our triage line 3 days later with complaint of headache and chest pain penetrating to her back.  She described the pain as a waxing and waning and worse when lying down.  Patient's troponins and D-dimer were both negative.  Patient endorsed migraines  while on Cardizem and discussion made to switch to propranolol.  Patient's Cardizem was stopped and will discuss propranolol further at follow-up.  Since last being seen in the office patient reports***.  Patient denies chest pain, palpitations, dyspnea, PND, orthopnea, nausea, vomiting, dizziness, syncope, edema, weight gain, or early satiety.     ***Notes:  Home Medications    Current Outpatient Medications  Medication Sig Dispense Refill   acetaminophen (TYLENOL) 500 MG tablet Take 1,000 mg by mouth every 8 (eight) hours as needed for headache (pain).     aspirin EC 81 MG tablet Take 1 tablet (81 mg total) by mouth daily. Swallow whole. 90 tablet 3   atorvastatin (LIPITOR) 40 MG tablet Take 1 tablet (40 mg total) by mouth at bedtime.     Cholecalciferol (VITAMIN D3) 50 MCG (2000 UT) capsule Take 2,000 Units by mouth every morning. 1 capsule     docusate sodium (COLACE) 100 MG capsule Take 1 capsule (100 mg  total) by mouth daily as needed for mild constipation.     ferrous sulfate 325 (65 FE) MG tablet Take 1 tablet (325 mg total) by mouth daily with breakfast. 30 tablet 0   FLUoxetine (PROZAC) 40 MG capsule Take 80 mg by mouth every morning.  11   furosemide (LASIX) 20 MG tablet Take 20 mg by mouth daily as needed for fluid or edema (ankle swelling).     gabapentin (NEURONTIN) 100 MG capsule Take 200 mg by mouth 3 (three) times daily as needed (nerve pain).     metoprolol tartrate (LOPRESSOR) 100 MG tablet Take one tablet by mouth 2 hours before CT scan 1 tablet 0   mirtazapine (REMERON) 15 MG tablet TAKE 1 TABLET BY MOUTH EVERYDAY AT BEDTIME 15 tablet 0   omeprazole (PRILOSEC) 20 MG capsule Take 20 mg by mouth every morning.     ondansetron (ZOFRAN) 4 MG tablet Take 4 mg by mouth daily as needed for nausea or vomiting.     oxyCODONE-acetaminophen (PERCOCET) 10-325 MG tablet Take 1 tablet by mouth every 4 (four) hours as needed for pain.     propranolol (INDERAL) 10 MG tablet Take 1  tablet (10 mg total) by mouth 2 (two) times daily. 60 tablet 0   SUMAtriptan (IMITREX) 100 MG tablet Take 50-100 mg by mouth daily as needed for migraine.     tiZANidine (ZANAFLEX) 4 MG tablet Take 4 mg by mouth 3 (three) times daily as needed for muscle spasms.     traZODone (DESYREL) 100 MG tablet Take 100 mg by mouth at bedtime.     vitamin B-12 (CYANOCOBALAMIN) 1000 MCG tablet Take 1,000 mcg by mouth daily. 1 tablet     No current facility-administered medications for this visit.     Review of Systems  Please see the history of present illness.    (+)*** (+)***  All other systems reviewed and are otherwise negative except as noted above.  Physical Exam    Wt Readings from Last 3 Encounters:  04/18/22 130 lb 6.4 oz (59.1 kg)  04/16/22 132 lb (59.9 kg)  01/20/22 146 lb 2.6 oz (66.3 kg)   PJ:ASNKN were no vitals filed for this visit.,There is no height or weight on file to calculate BMI.  Constitutional:      Appearance: Healthy appearance. Not in distress.  Neck:     Vascular: JVD normal.  Pulmonary:     Effort: Pulmonary effort is normal.     Breath sounds: No wheezing. No rales. Diminished in the bases Cardiovascular:     Normal rate. Regular rhythm. Normal S1. Normal S2.      Murmurs: There is no murmur.  Edema:    Peripheral edema absent.  Abdominal:     Palpations: Abdomen is soft non tender. There is no hepatomegaly.  Skin:    General: Skin is warm and dry.  Neurological:     General: No focal deficit present.     Mental Status: Alert and oriented to person, place and time.     Cranial Nerves: Cranial nerves are intact.  EKG/LABS/Other Studies Reviewed    ECG personally reviewed by me today - ***  Risk Assessment/Calculations:   {Does this patient have ATRIAL FIBRILLATION?[phone]}        Lab Results  Component Value Date   WBC 6.9 04/22/2022   HGB 14.2 04/22/2022   HCT 46.9 (H) 04/22/2022   MCV 86.1 04/22/2022   PLT 251 04/22/2022   Lab  Results  Component  Value Date   CREATININE 0.67 04/22/2022   BUN 11 04/22/2022   NA 142 04/22/2022   K 4.4 04/22/2022   CL 109 04/22/2022   CO2 26 04/22/2022   Lab Results  Component Value Date   ALT 10 01/21/2022   AST 11 (L) 01/21/2022   ALKPHOS 94 01/21/2022   BILITOT 1.0 01/21/2022   Lab Results  Component Value Date   CHOL 178 02/18/2017   HDL 48 02/18/2017   LDLCALC 102 (H) 02/18/2017   TRIG 139 02/18/2017   CHOLHDL 3.7 02/18/2017    Lab Results  Component Value Date   HGBA1C 4.8 02/19/2017    Assessment & Plan    1.  Supraventricular tachycardia: -Recent ZIO monitor revealed SVT -Today patient reports palpitations that have increased in intensity especially in the early morning and are associated with anxiety -We will increase Cardizem to 240 mg daily   2.  Nonobstructive coronary artery disease: -Coronary CTA completed showing nonobstructive  -Patient denies chest pain today -Continue ASA 81 mg and Lipitor 40 mg   3.  Hyperlipidemia: -We will plan to update lipids and LFTs on next visit -Continue atorvastatin 40 mg daily   4.  Dyspnea on exertion: -Patient is euvolemic on examination today -Patient reports dyspnea has improved but at times is bothersome due to deviated septum -Pulmonary referral was placed and patient followed up with no changes to current treatment plan -Continue Lasix as needed   5.  Aortic atherosclerosis: -Noted on most recent cardiac CTA -Continue ASA and Lipitor with strict BP control      Disposition: Follow-up with Early Osmond, MD or APP in *** months {Are you ordering a CV Procedure (e.g. stress test, cath, DCCV, TEE, etc)?   Press F2        :[phone]}   Medication Adjustments/Labs and Tests Ordered: Current medicines are reviewed at length with the patient today.  Concerns regarding medicines are outlined above.   Signed, Mable Fill, Marissa Nestle, NP 05/02/2022, 10:08 AM [facility]  Note:   This document was prepared using Dragon voice recognition software and may include unintentional dictation errors.

## 2022-05-05 DIAGNOSIS — R002 Palpitations: Secondary | ICD-10-CM | POA: Diagnosis not present

## 2022-05-05 DIAGNOSIS — Z23 Encounter for immunization: Secondary | ICD-10-CM | POA: Diagnosis not present

## 2022-05-05 DIAGNOSIS — G43909 Migraine, unspecified, not intractable, without status migrainosus: Secondary | ICD-10-CM | POA: Diagnosis not present

## 2022-05-06 ENCOUNTER — Ambulatory Visit: Payer: Medicare Other | Attending: Nurse Practitioner | Admitting: Nurse Practitioner

## 2022-05-09 IMAGING — MR MR CERVICAL SPINE W/O CM
4 of 5 series · 29 of 48 positions shown · non-contrast
Comparison: Cervical spine MRI 12/19/2015. Cervical spine CT
02/17/2017.

CLINICAL DATA: 68-year-old female with gait disturbance and falls
for several years. Left shoulder injury. Left side weakness. Minimal
improvement from recent injections.

EXAM:
MRI CERVICAL SPINE WITHOUT CONTRAST
TECHNIQUE: Multiplanar, multisequence MR imaging of the cervical spine was
performed. No intravenous contrast was administered.

[Series 3: T2 · sagittal · 3.0mm · 0.66mm/px · 8 of 16 slices shown (1 of 2)]
[im 1/16]
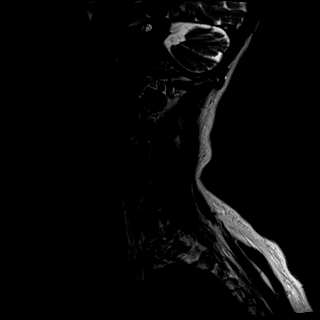
[im 3/16]
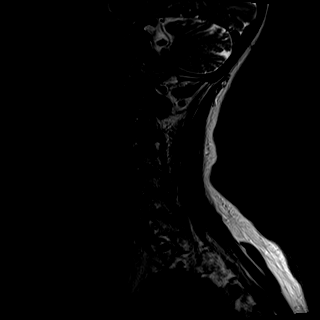
[im 5/16]
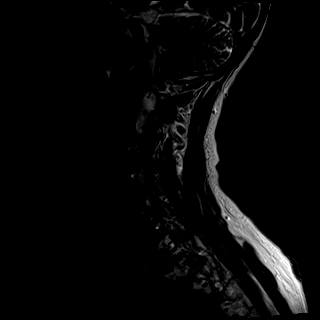
[im 7/16]
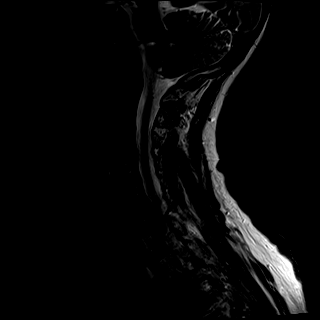
[im 9/16]
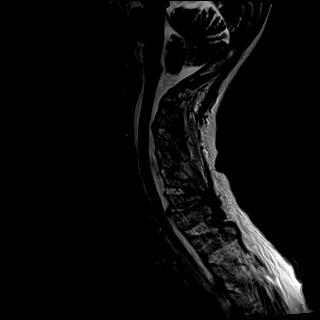
[im 11/16]
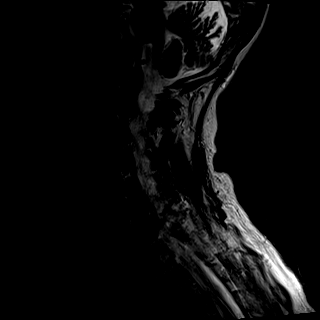
[im 13/16]
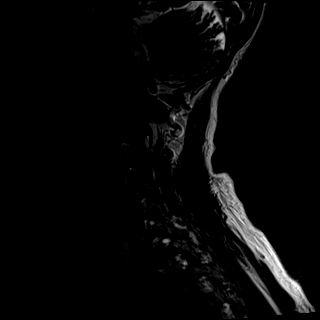
[im 16/16]
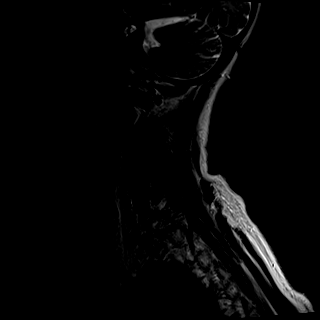

[Series 4: T1 · sagittal · 3.0mm · 0.41mm/px · 7 of 16 slices shown]
[im 1/16]
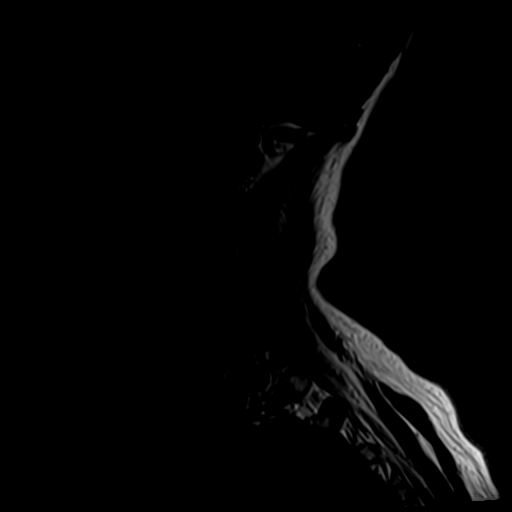
[im 3/16]
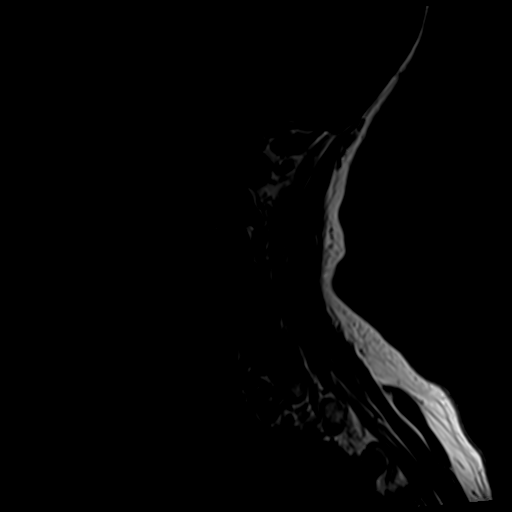
[im 6/16]
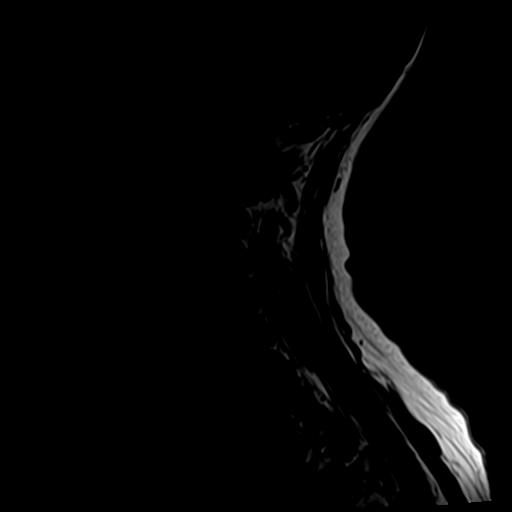
[im 8/16]
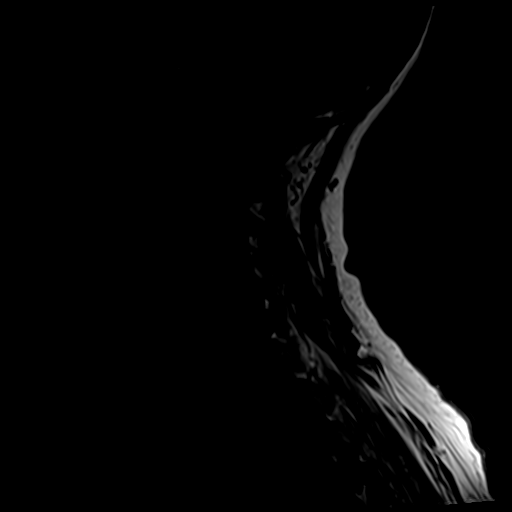
[im 11/16]
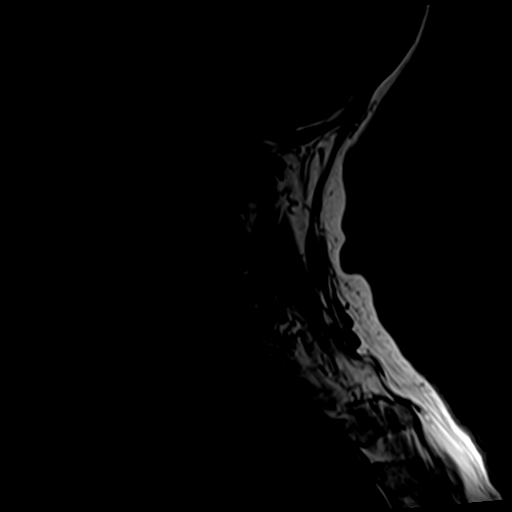
[im 13/16]
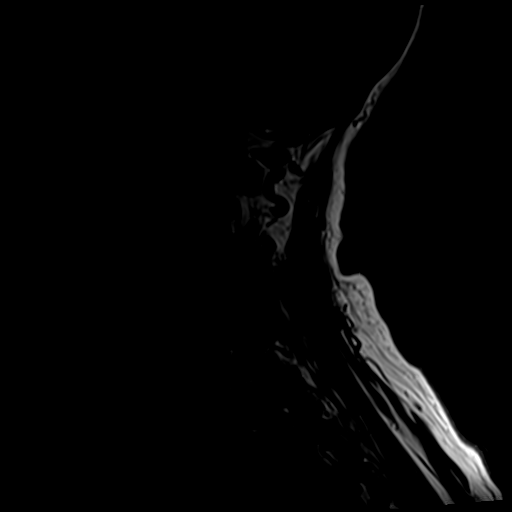
[im 16/16]
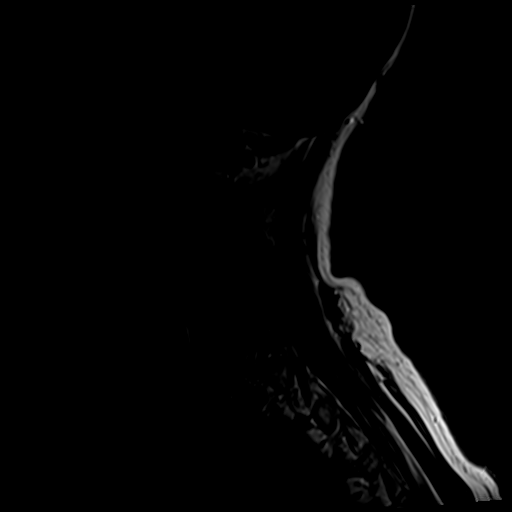

[Series 5: tir sag · sagittal · 3.0mm · 0.41mm/px · 5 of 16 slices shown]
[im 1/16]
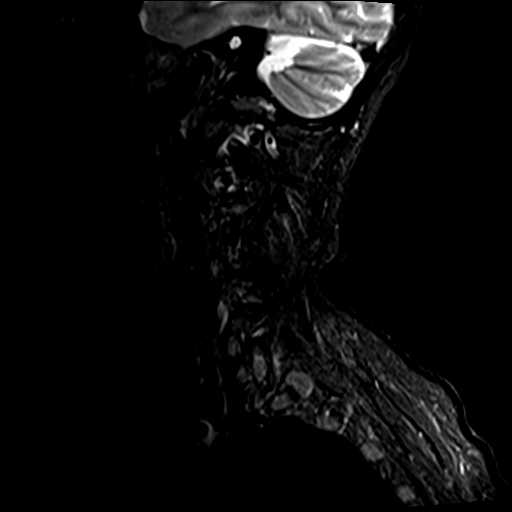
[im 3/16]
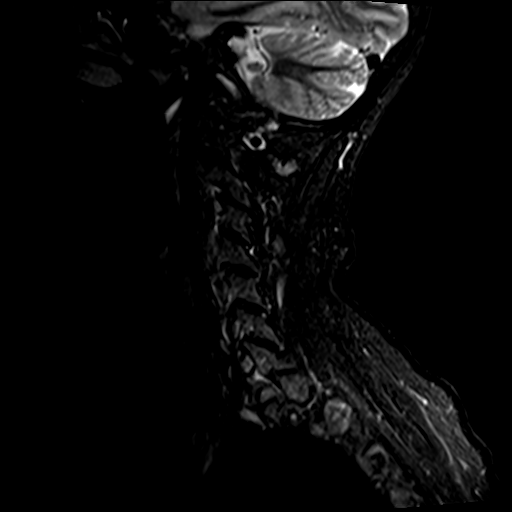
[im 6/16]
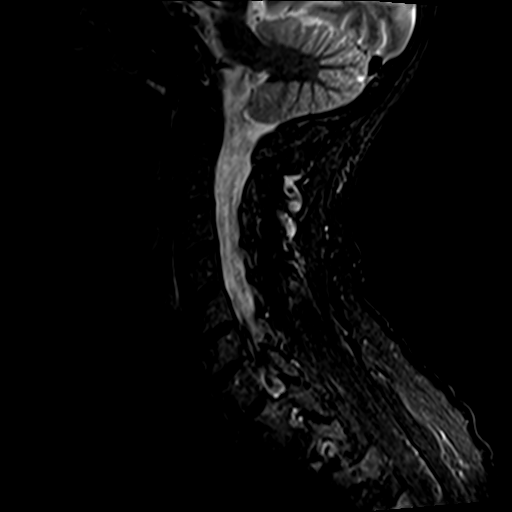
[im 8/16]
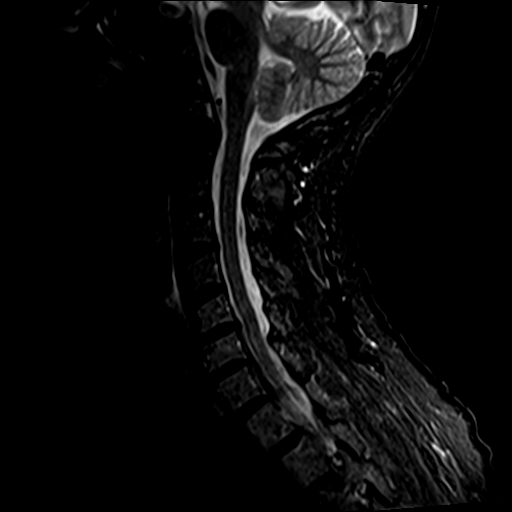
[im 13/16]
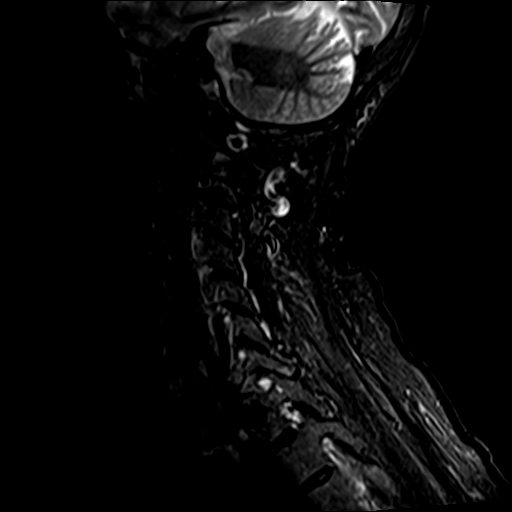

[Series 6: T2 · axial · 3.0mm · 0.70mm/px · z∈[-71,+26]mm · 9 of 28 slices shown (2 of 2)]
[im 1/28]
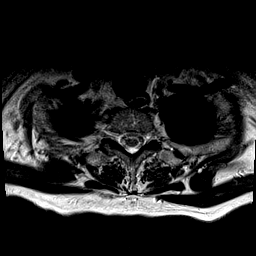
[im 5/28]
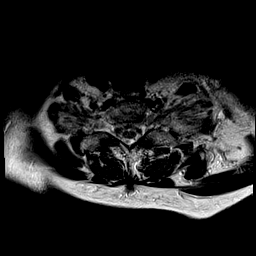
[im 10/28]
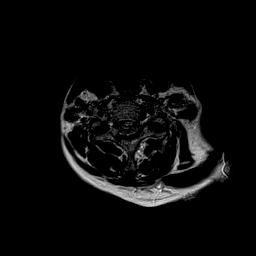
[im 12/28]
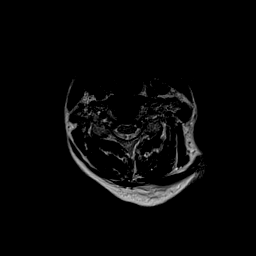
[im 14/28]
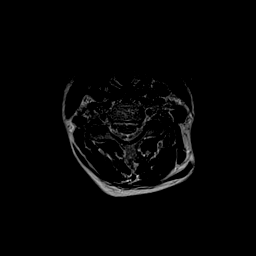
[im 16/28]
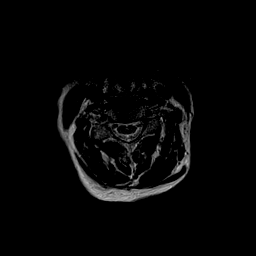
[im 19/28]
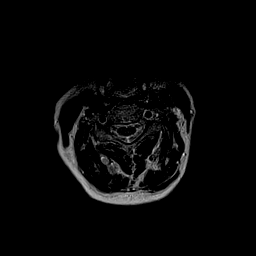
[im 23/28]
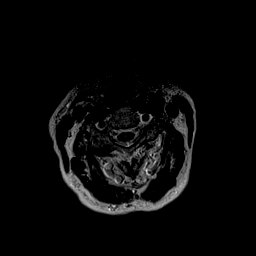
[im 28/28]
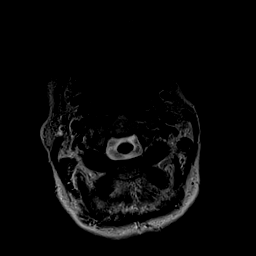

[29 of 48 positions shown; findings below may reference images not displayed]

FINDINGS: Alignment: Cervical lordosis appears similar to that in 6974,
improved compared to the prior CT. Mild chronic retrolisthesis of C5
on C6 and C6 on C7 appears mildly progressed since 6974.

Vertebrae: No marrow edema or evidence of acute osseous abnormality.
Normal background bone marrow signal.

Cord: Capacious cervical spinal canal at most levels. The cervical
and visible upper thoracic spinal cord appears to remain normal.

Posterior Fossa, vertebral arteries, paraspinal tissues:
Cervicomedullary junction is within normal limits. Negative visible
posterior fossa and brain parenchyma. Preserved major vascular flow
voids in the neck. Negative visible neck soft tissues, lung apices.

Disc levels:

C2-C3:  Mild chronic facet hypertrophy on the right.  No stenosis.

C3-C4: Mild chronic facet hypertrophy. Minimal disc bulge. No
stenosis.

C4-C5: Mild chronic facet hypertrophy greater on the right. No
stenosis.

C5-C6: Mild retrolisthesis and disc space loss with left eccentric
disc bulging and endplate spurring. Mild facet hypertrophy. No
significant spinal stenosis. Mild left C6 foraminal stenosis appears
increased since 6974.

C6-C7: Mild retrolisthesis and disc space loss with circumferential
disc bulge and endplate spurring. Mild to moderate ligament flavum
hypertrophy. Mild spinal stenosis. Mild if any cord mass effect.
Moderate C7 foraminal stenosis. This level has mildly progressed.

C7-T1:  Negative.

Negative visible upper thoracic levels.
IMPRESSION: 1. Increased subtle retrolisthesis at C5-C6 and C6-C7 since a 6974
MRI.
C6-C7 mild spinal and moderate biforaminal appears stable to mildly
progressed since that time.
And mild C5-C6 left foraminal stenosis appears increased.
Query C6 or C7 radiculitis.

2. Stable and generally mild for age cervical spine degeneration
elsewhere. No other convincing neural impingement.

## 2022-06-30 ENCOUNTER — Ambulatory Visit: Payer: Medicare Other | Admitting: Internal Medicine

## 2022-07-16 DIAGNOSIS — M1711 Unilateral primary osteoarthritis, right knee: Secondary | ICD-10-CM | POA: Diagnosis not present

## 2022-08-15 NOTE — Progress Notes (Unsigned)
Cardiology Office Note:    Date:  08/21/2022   ID:  Dorothy White, DOB Jul 12, 1951, MRN [id number]  PCP:  Jonathon Jordan, MD   [facility] Providers Cardiologist:  Lenna Sciara, MD Referring MD: Jonathon Jordan, MD   Chief Complaint/Reason for Referral: Palpitations and dyspnea  PATIENT DID NOT APPEAR FOR APPOINTMENT   ASSESSMENT:    1. Dyspnea, unspecified type   2. Mild CAD   3. Hyperlipidemia LDL goal <70   4. Tobacco abuse   5. Bilateral carotid artery disease, unspecified type ([place])   6. SVT (supraventricular tachycardia)      PLAN:    In order of problems listed above: 1.  Dyspnea:  She has had a reassuring cardiac evaluation.  Consider PFTs and could consider right heart catheterization giving mildly elevated noninvasive pressures of her pulmonary artery on echocardiogram. 2.  Mild coronary artery disease: Continue aspirin and statin as well as strict blood pressure control. 3.  Hyperlipidemia: Check lipid panel, LFTs, and LP(a) today.   4.  Tobacco abuse:   5.  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  Will refer to vascular surgery for further recommendations. 6.  SVT: Continue propranolol.     Dispo:  No follow-ups on file.         History of Present Illness:    FOCUSED PROBLEM LIST:   1.  CTA 2023 with calcium score of 0 with aortic atherosclerosis 2.  Hyperlipidemia 3.  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  Tobacco abuse   May 2023: Patient seen for initial consultation regarding palpitations and dyspnea.  She was referred for coronary CTA, echocardiogram, event monitor.  Coronary CTA showed calcium score of 0 with no obstructive coronary artery disease however did show aortic atherosclerosis.  Monitor showed episodes of supraventricular tachycardia.  She was started on diltiazem 120  mg daily.  Echocardiogram showed normal LV function and RV function however mildly elevated PA systolic pressure.  Carotid Dopplers were also negative for obstructive disease.   Today: In the interim the patient had a monitor done which demonstrated SVT.  She was started on diltiazem but developed migraines from this and was eventually changed to propranolol.  Was seen in the emergency department in September with chest pain that was thought to be noncardiac in nature.  Chest CT was negative.  Her troponins were negative as well and she had just had a coronary CTA demonstrating reassuring findings.  Current Medications: No outpatient medications have been marked as taking for the 08/21/22 encounter (Office Visit) with Early Osmond, MD.     Allergies:    Opana [oxymorphone hcl]   Social History:   Social History   Tobacco Use   Smoking status: Some Days    Packs/day: 0.25    Years: 34.00    Total pack years: 8.50    Types: Cigarettes   Smokeless tobacco: Never  Vaping Use   Vaping Use: Never used  Substance Use Topics   Alcohol use: Not Currently    Alcohol/week: 1.0 standard drink of alcohol    Types: 1 Standard drinks or equivalent per week    Comment: occ    Drug use: No    Comment: Denies any drug use 02/19/18     Family Hx: Family History  Problem Relation Age of Onset   Lung cancer Mother 72   Migraines Mother  Heart attack Father        Vague   Hypertension Brother    Esophageal cancer Brother    Diabetes Paternal Grandmother        questionable   Colon cancer Neg Hx    Kidney disease Neg Hx    Liver disease Neg Hx      Review of Systems:   Please see the history of present illness.    All other systems reviewed and are negative.     EKGs/Labs/Other Test Reviewed:    EKG:  EKG performed April 2023 that I personally reviewed demonstrates sinus rhythm with nonspecific ST and T wave changes; EKG performed today that I personally reviewed demonstrates  sinus rhythm with nonspecific ST and T wave changes.  Prior CV studies:  Coronary CTA 2023: 1. Coronary calcium score of 0. This was 1st percentile for age, sex, and race matched control. 2. Normal coronary origin with Right dominance. 3. CAD-RADS 2. Mild non-obstructive CAD (25-49%). Consider non-atherosclerotic causes of chest pain. Consider preventive therapy and risk factor modification despite zero calcium score. 4.  Aortic atherosclerosis  TTE 2023   1. Left ventricular ejection fraction, by estimation, is 60 to 65%. The  left ventricle has normal function. The left ventricle has no regional  wall motion abnormalities. Left ventricular diastolic parameters are  consistent with Grade I diastolic  dysfunction (impaired relaxation).   2. Right ventricular systolic function is normal. The right ventricular  size is normal. There is mildly elevated pulmonary artery systolic  pressure. The estimated right ventricular systolic pressure is 81.0 mmHg.   3. The mitral valve is normal in structure. No evidence of mitral valve  regurgitation. No evidence of mitral stenosis.   4. The aortic valve is tricuspid. Aortic valve regurgitation is not  visualized. No aortic stenosis is present.   5. The inferior vena cava is normal in size with greater than 50%  respiratory variability, suggesting right atrial pressure of 3 mmHg.   Monitor 2023: 19 Supraventricular Tachycardia runs occurred, the run with the fastest interval lasting 9 beats with a max rate of 200 bpm, the  longest lasting 10.1 secs with an avg rate of 142 bpm. Supraventricular Tachycardia was detected within +/- 45 seconds of symptomatic patient event(s).    Isolated SVEs were rare (<1.0%), SVE Couplets were rare (<1.0%), and SVE Triplets were rare (<1.0%).    Isolated VEs were rare (<1.0%), and no VE Couplets or VE Triplets were present.    Patient triggered events corresponded with sinus rhythm and supraventricular  tachycardia.   No atrial fibrillation, sustained ventricular tachyarrhythmias, or bradyarrhythmias were detected.  Carotid ultrasound 2023 Right Carotid: There is no evidence of stenosis in the right ICA. Bead-like appearance in the distal segment with mildly elevated velocities suggestive for fibromuscular dysplasia.   Left Carotid: There is no evidence of stenosis in the left ICA. Bead-like appearance in the distal segment with elevated velocities suggestive for fibromuscular dysplasia.   Vertebrals:  Bilateral vertebral arteries demonstrate antegrade flow.  Subclavians: Normal flow hemodynamics were seen in bilateral subclavian arteries.   Coronary angiography 2017 with mild mid LAD disease  Other studies Reviewed: Review of the additional studies/records demonstrates: CT chest 2018 without aortic atherosclerosis of coronary artery calcification  Recent Labs: 01/21/2022: ALT 10 04/22/2022: BUN 11; Creatinine, Ser 0.67; Hemoglobin 14.2; Magnesium 1.9; Platelets 251; Potassium 4.4; Sodium 142; TSH 1.336   Recent Lipid Panel Lab Results  Component Value Date/Time   CHOL 178 02/18/2017 07:54  PM   CHOL 243 (H) 08/15/2013 08:39 AM   TRIG 139 02/18/2017 07:54 PM   HDL 48 02/18/2017 07:54 PM   HDL 55 08/15/2013 08:39 AM   LDLCALC 102 (H) 02/18/2017 07:54 PM   LDLCALC 142 (H) 08/15/2013 08:39 AM    Risk Assessment/Calculations:           Physical Exam:   Signed, Early Osmond, MD  08/21/2022 2:44 PM    [facility] [place], [place], [place]  [address] Phone: [phone]; Fax: [phone]   Note:  This document was prepared using Dragon voice recognition software and may include unintentional dictation errors.

## 2022-08-21 ENCOUNTER — Ambulatory Visit: Payer: 59 | Attending: Internal Medicine | Admitting: Internal Medicine

## 2022-08-21 DIAGNOSIS — I471 Supraventricular tachycardia, unspecified: Secondary | ICD-10-CM

## 2022-08-21 DIAGNOSIS — Z720 Tobacco use: Secondary | ICD-10-CM

## 2022-08-21 DIAGNOSIS — I779 Disorder of arteries and arterioles, unspecified: Secondary | ICD-10-CM

## 2022-08-21 DIAGNOSIS — I2510 Atherosclerotic heart disease of native coronary artery without angina pectoris: Secondary | ICD-10-CM

## 2022-08-21 DIAGNOSIS — E785 Hyperlipidemia, unspecified: Secondary | ICD-10-CM

## 2022-08-21 DIAGNOSIS — R0600 Dyspnea, unspecified: Secondary | ICD-10-CM

## 2022-08-26 DIAGNOSIS — M5416 Radiculopathy, lumbar region: Secondary | ICD-10-CM | POA: Diagnosis not present

## 2022-08-26 DIAGNOSIS — Z79891 Long term (current) use of opiate analgesic: Secondary | ICD-10-CM | POA: Diagnosis not present

## 2022-08-26 DIAGNOSIS — Z79899 Other long term (current) drug therapy: Secondary | ICD-10-CM | POA: Diagnosis not present

## 2022-08-26 DIAGNOSIS — M4316 Spondylolisthesis, lumbar region: Secondary | ICD-10-CM | POA: Diagnosis not present

## 2022-08-26 DIAGNOSIS — M47896 Other spondylosis, lumbar region: Secondary | ICD-10-CM | POA: Diagnosis not present

## 2022-08-26 DIAGNOSIS — G894 Chronic pain syndrome: Secondary | ICD-10-CM | POA: Diagnosis not present

## 2022-08-26 DIAGNOSIS — Z5181 Encounter for therapeutic drug level monitoring: Secondary | ICD-10-CM | POA: Diagnosis not present

## 2022-09-07 IMAGING — CR DG HIP (WITH OR WITHOUT PELVIS) 2-3V*R*
3 series · 3 of 3 positions shown · non-contrast
Comparison: None.

CLINICAL DATA: 69-year-old female with right hip pain after falling

EXAM:
DG HIP (WITH OR WITHOUT PELVIS) 2-3V RIGHT

[x pelvis]
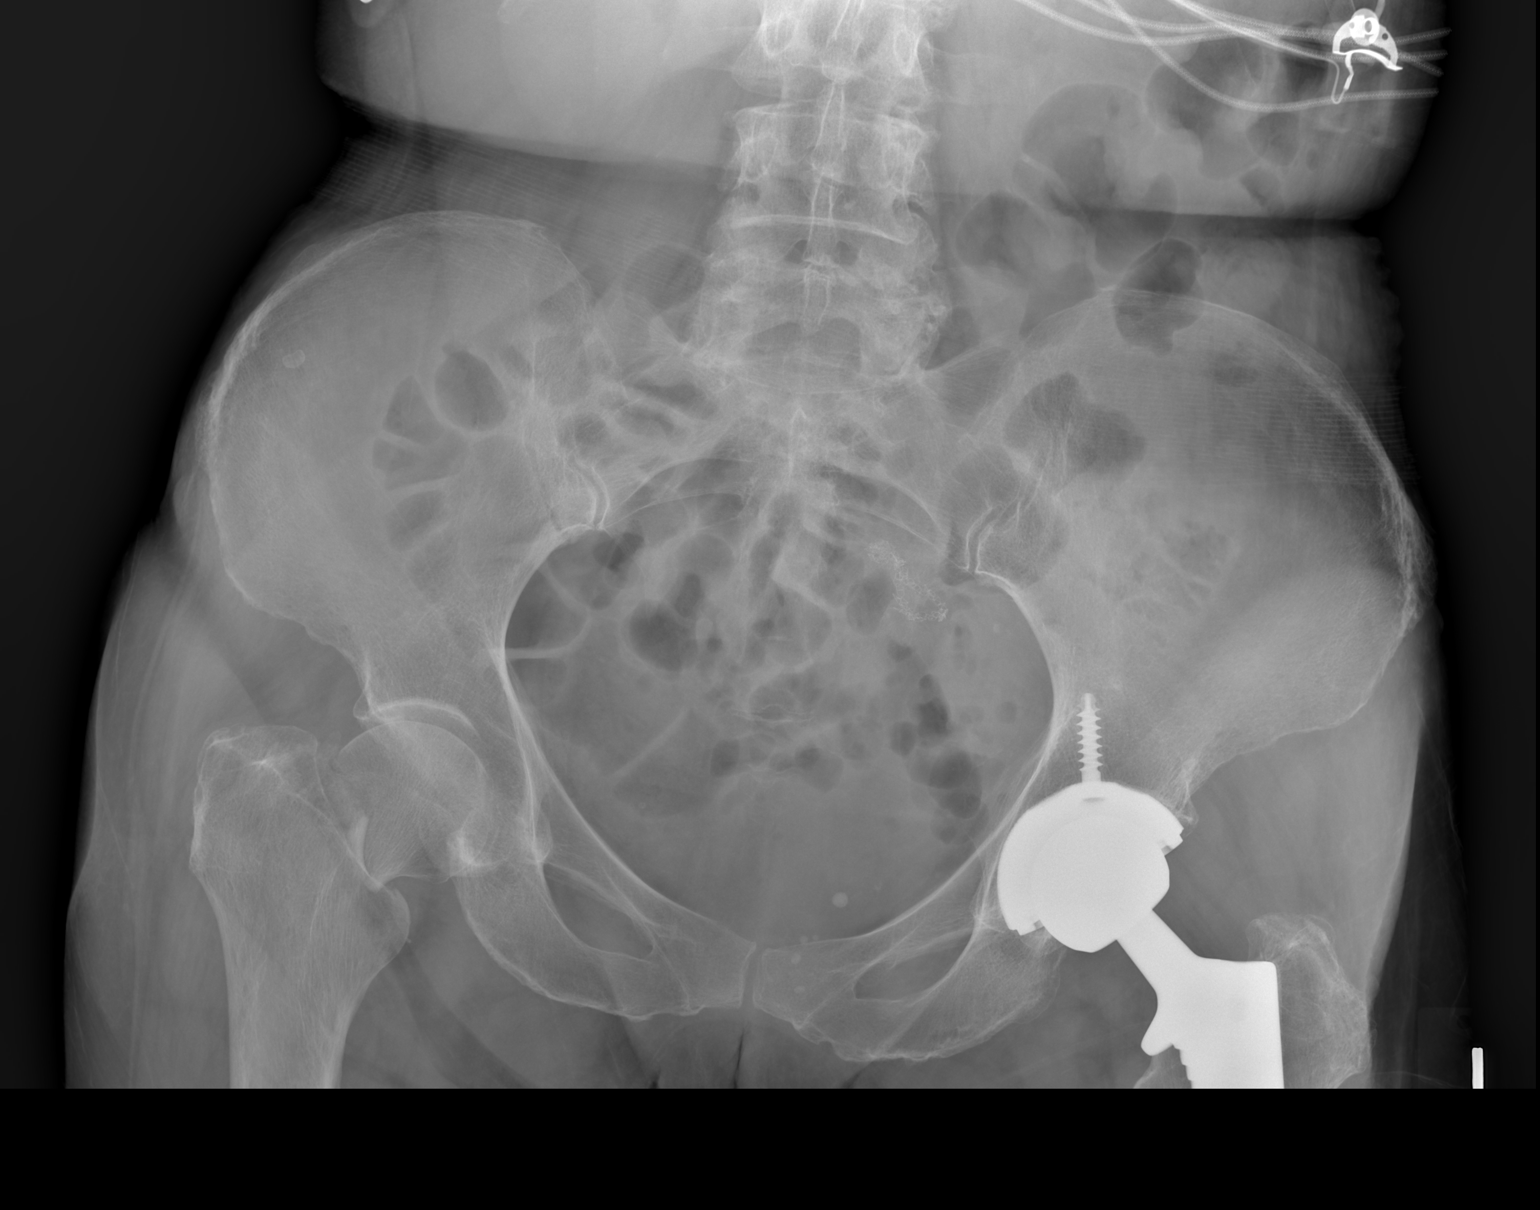

[x hip ap right]
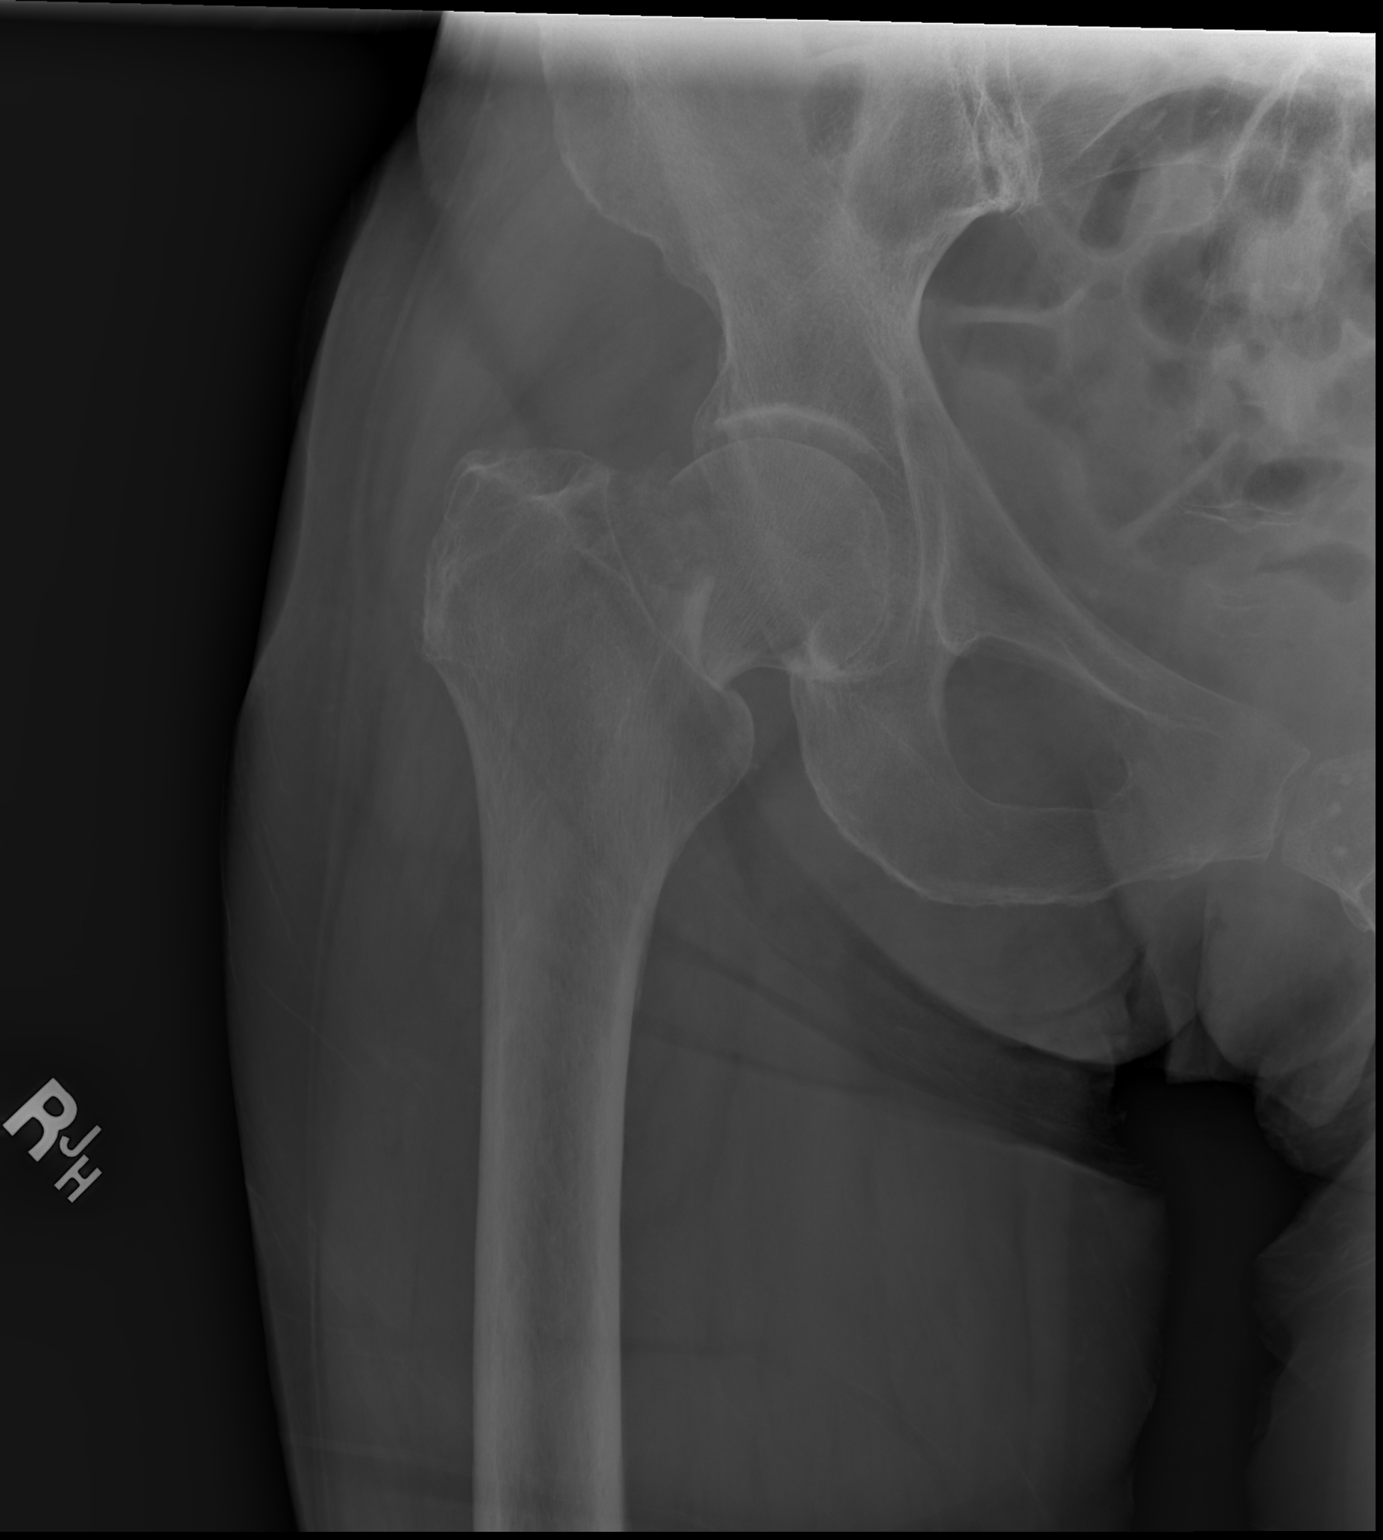

[w hip lat right]
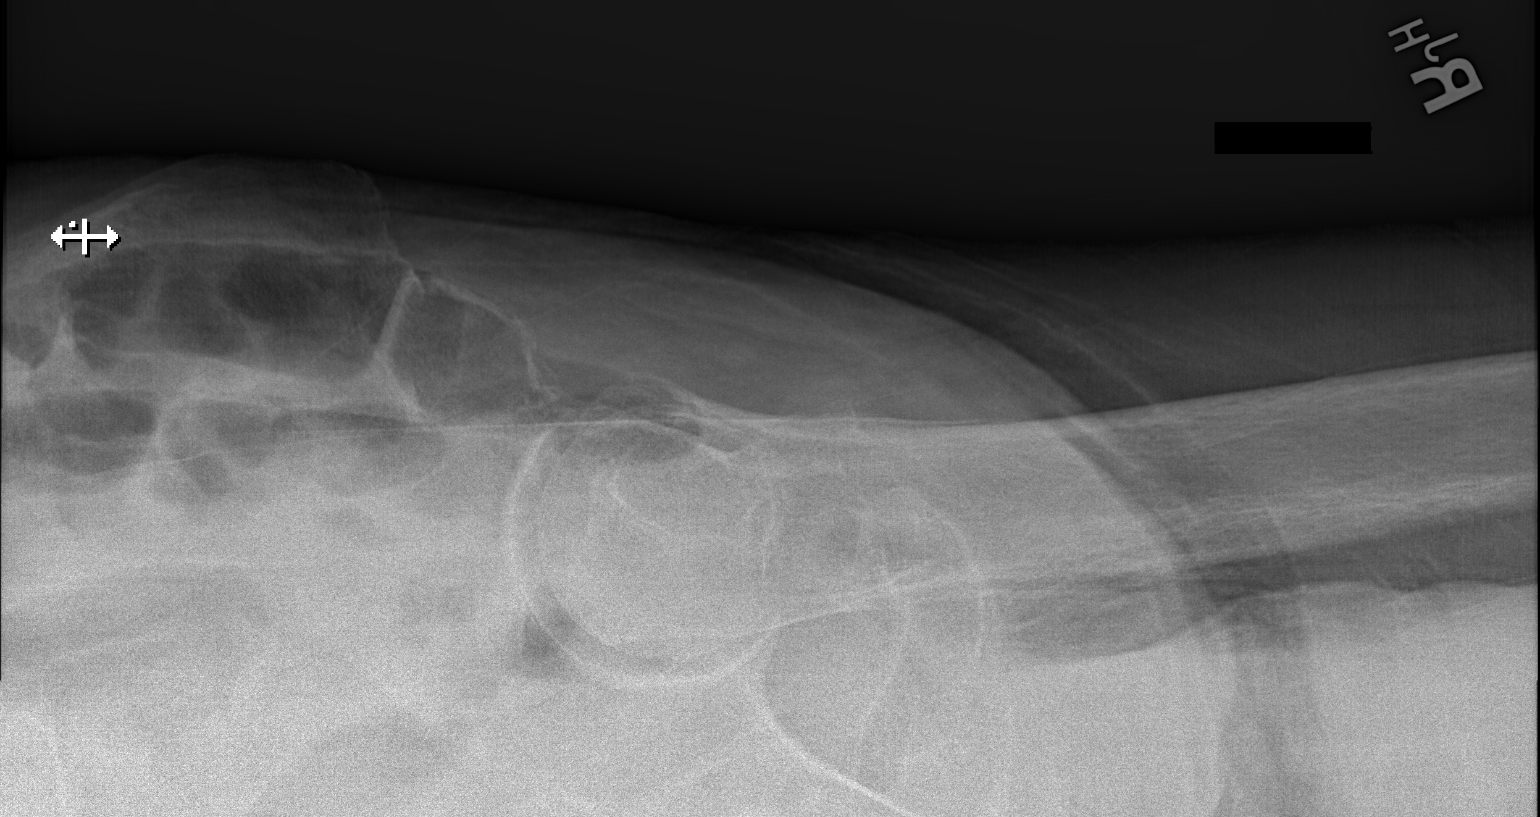

[3 of 3 positions shown; findings below may reference images not displayed]

FINDINGS: Acute fracture through the right femoral neck consistent with a
transcervical fracture. The femoral head remains located.
Incompletely imaged surgical changes of left total hip arthroplasty.
Bony pelvis is intact. Left greater than right facet arthropathy at
L4-L5 and L5-S1.
IMPRESSION: Acute right transcervical femoral neck fracture.

## 2022-09-08 IMAGING — DX DG KNEE 1-2V PORT*R*
2 series · 4 of 4 positions shown · non-contrast
Comparison: None.

CLINICAL DATA: Pain following trauma

EXAM:
PORTABLE RIGHT KNEE - 1-2 VIEW

[knee ap]
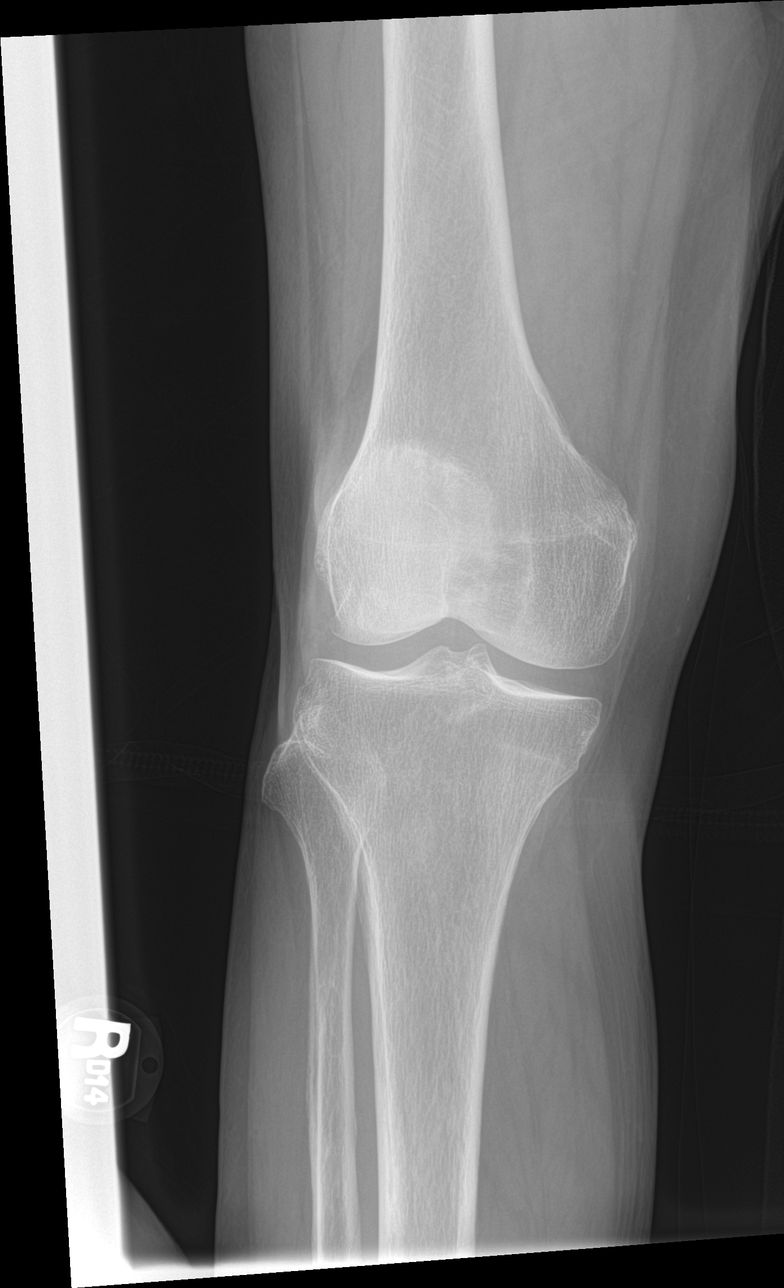

[Series 2: knee lat · 0.14mm/px · 3 of 3 slices shown]
[im 1/3]
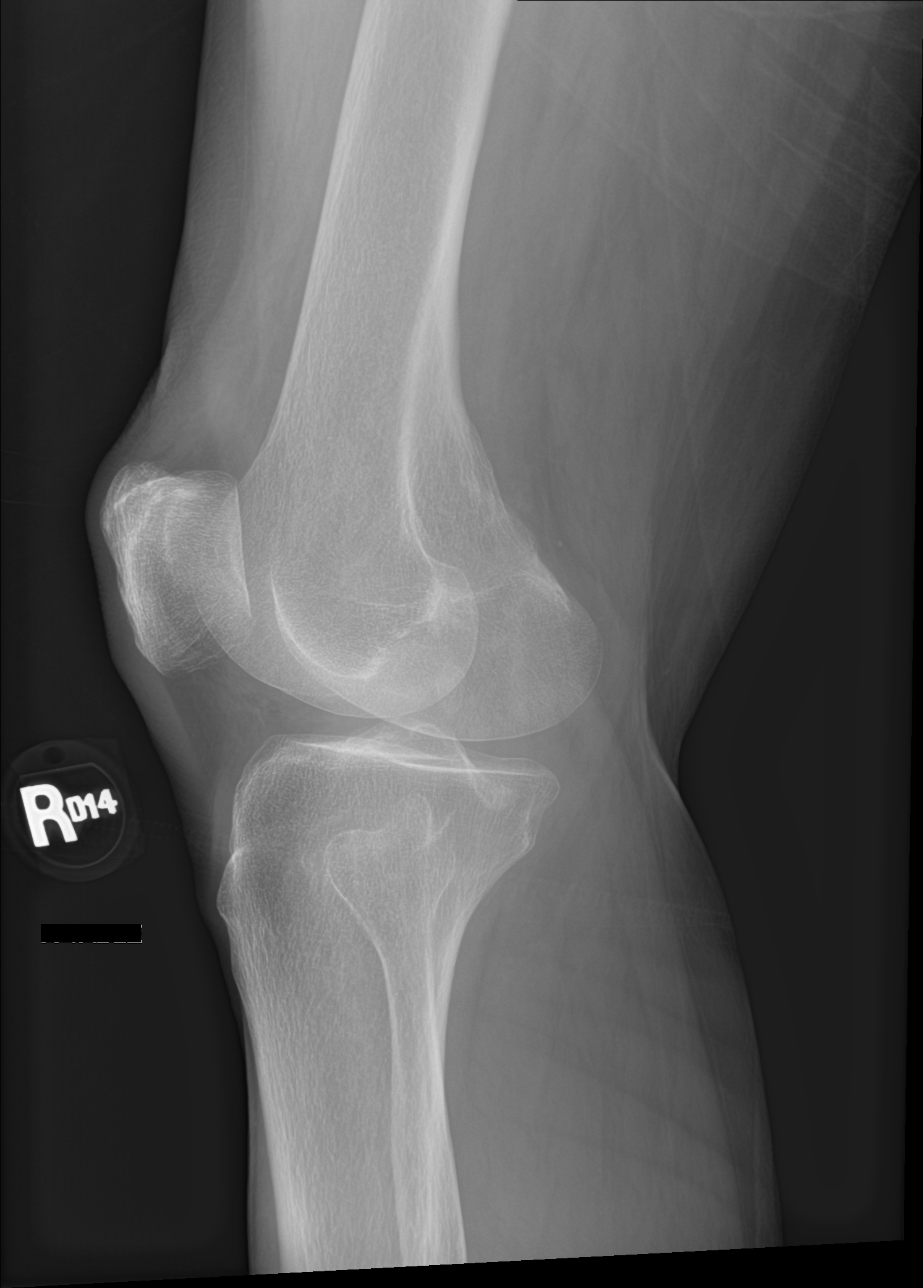
[im 2/3]
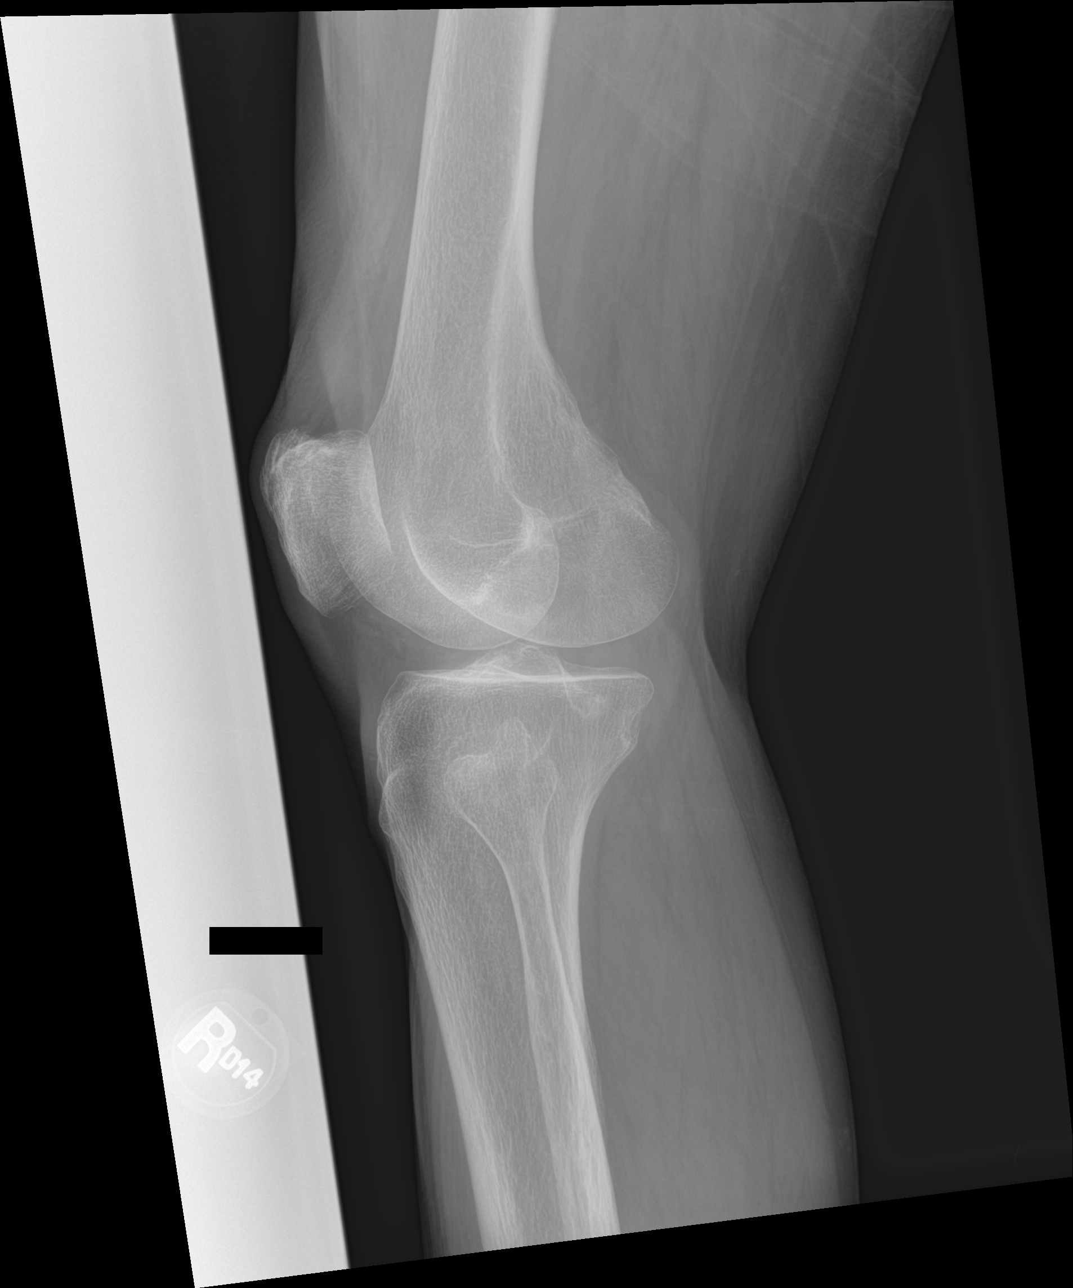
[im 3/3]
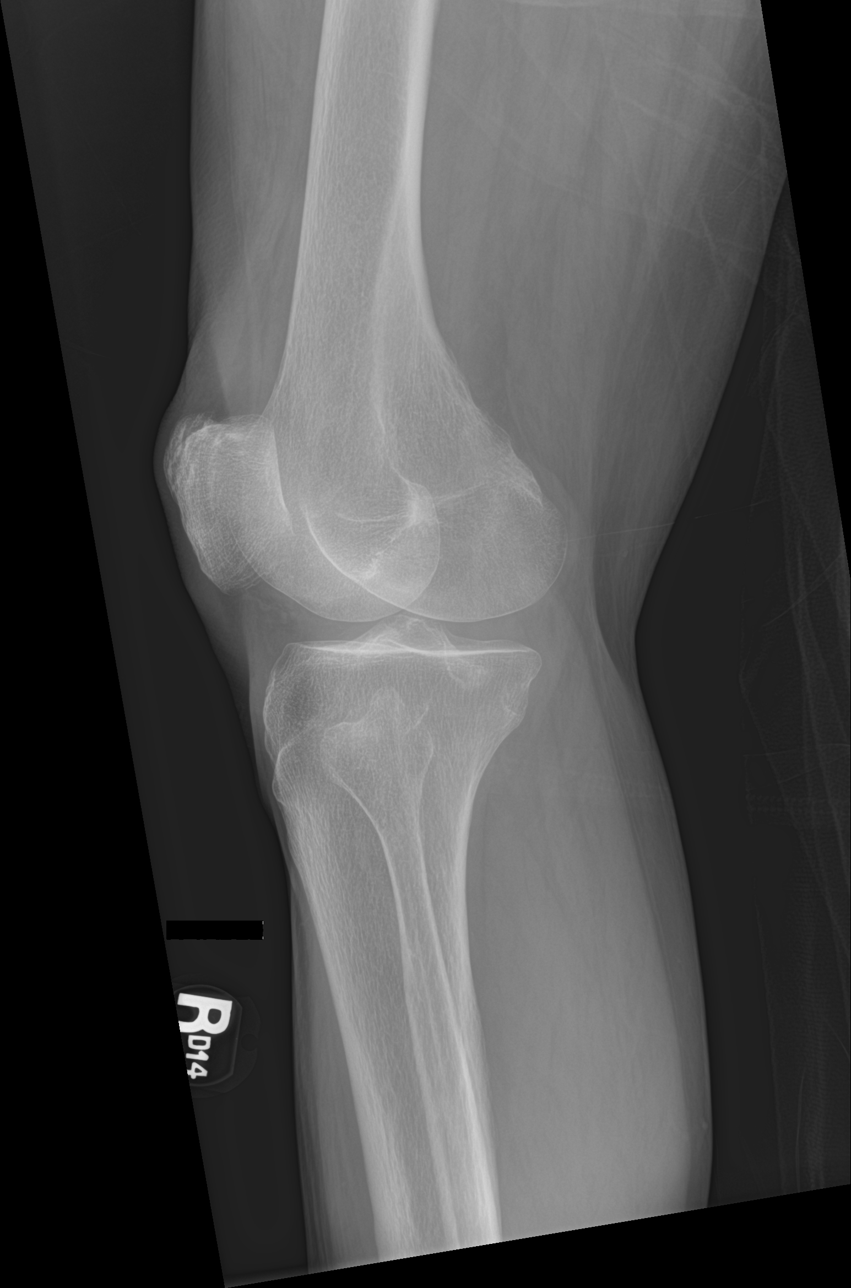

[4 of 4 positions shown; findings below may reference images not displayed]

FINDINGS: Frontal and lateral views obtained. No fracture or dislocation. No
joint effusion. No appreciable joint space narrowing. There is a
spur along the anterior superior patella. No erosion.
IMPRESSION: Spur along the anterior superior patella is likely indicative of
distal quadriceps tendinosis. No fracture or dislocation. No joint
effusion. No appreciable joint space narrowing.

## 2022-09-09 IMAGING — RF DG HIP (WITH PELVIS) OPERATIVE*R*
1 series · 6 of 6 positions shown · non-contrast
Comparison: None.

CLINICAL DATA: Right total hip replacement.

EXAM:
OPERATIVE right HIP (WITH PELVIS IF PERFORMED) 6 VIEWS
TECHNIQUE: Fluoroscopic spot image(s) were submitted for interpretation
post-operatively.
Radiation exposure index: 0.7839 mGy.

[Series 1: unknown protocol · 0.20mm/px · 6 of 6 slices shown]
[im 1/6]
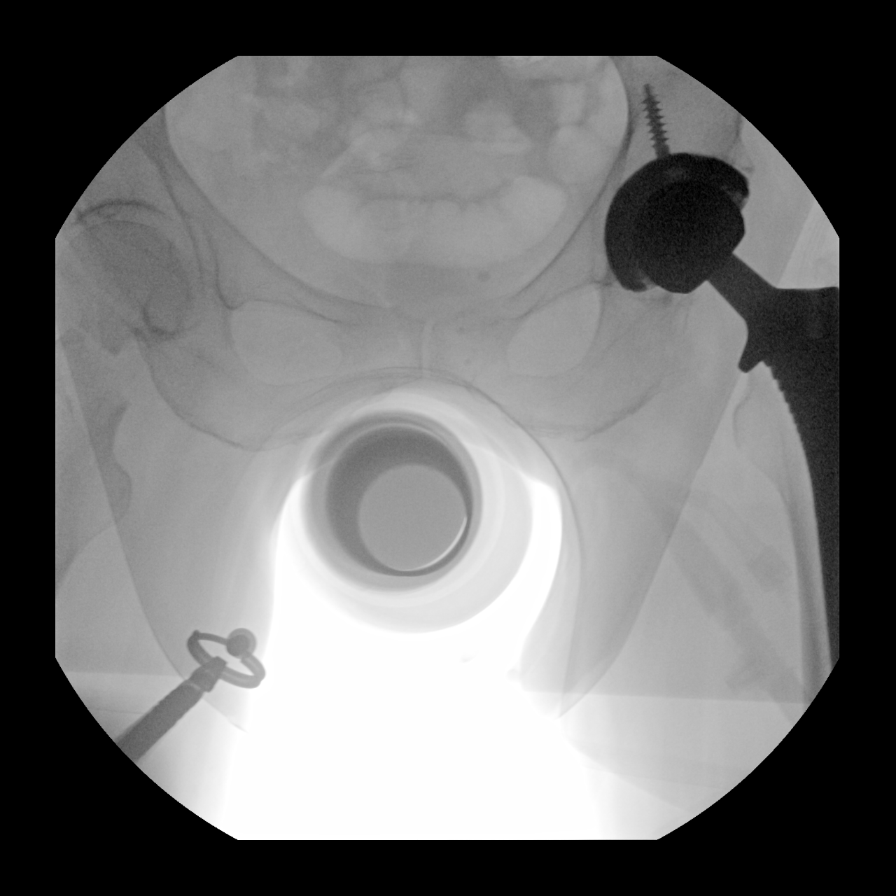
[im 2/6]
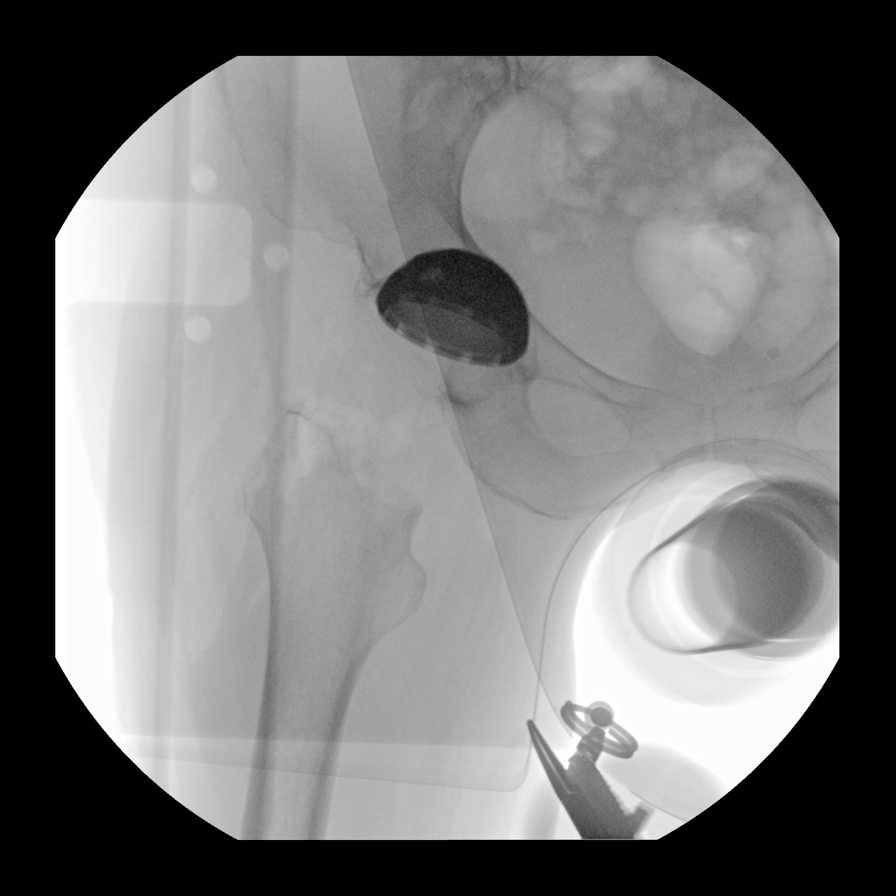
[im 3/6]
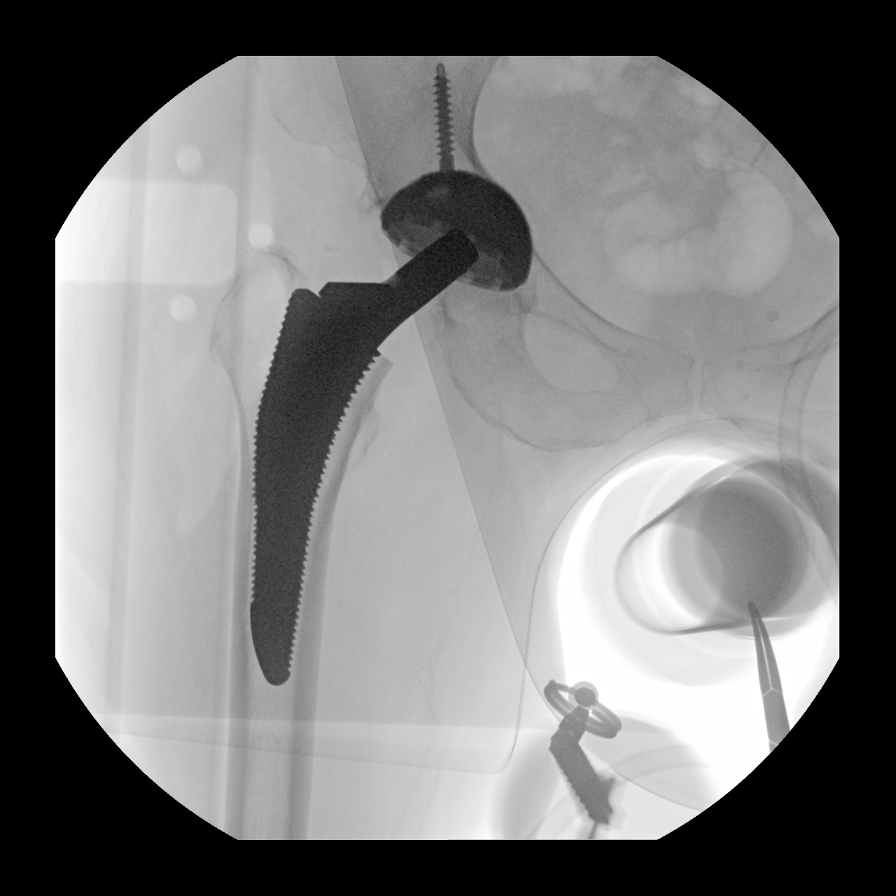
[im 4/6]
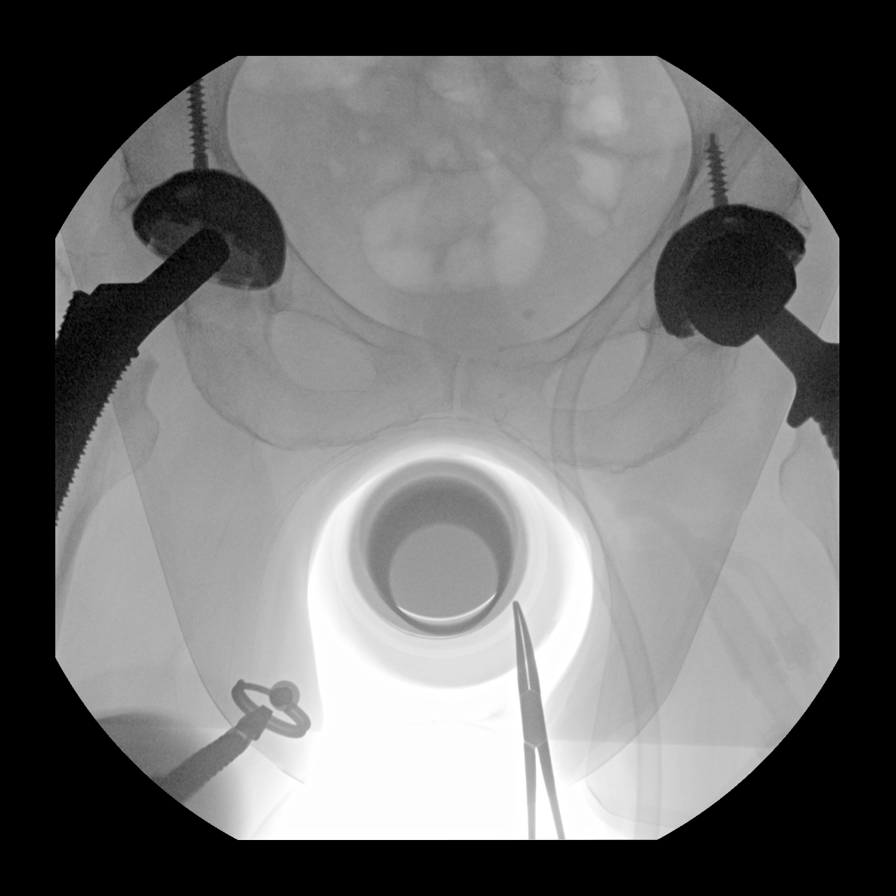
[im 5/6]
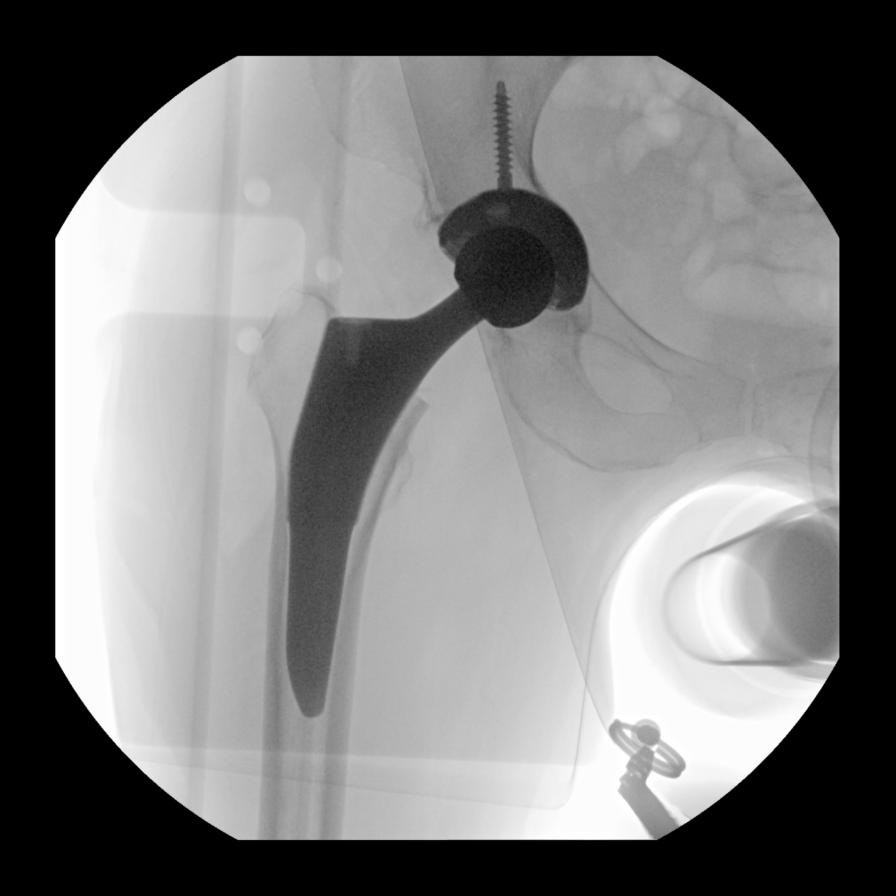
[im 6/6]
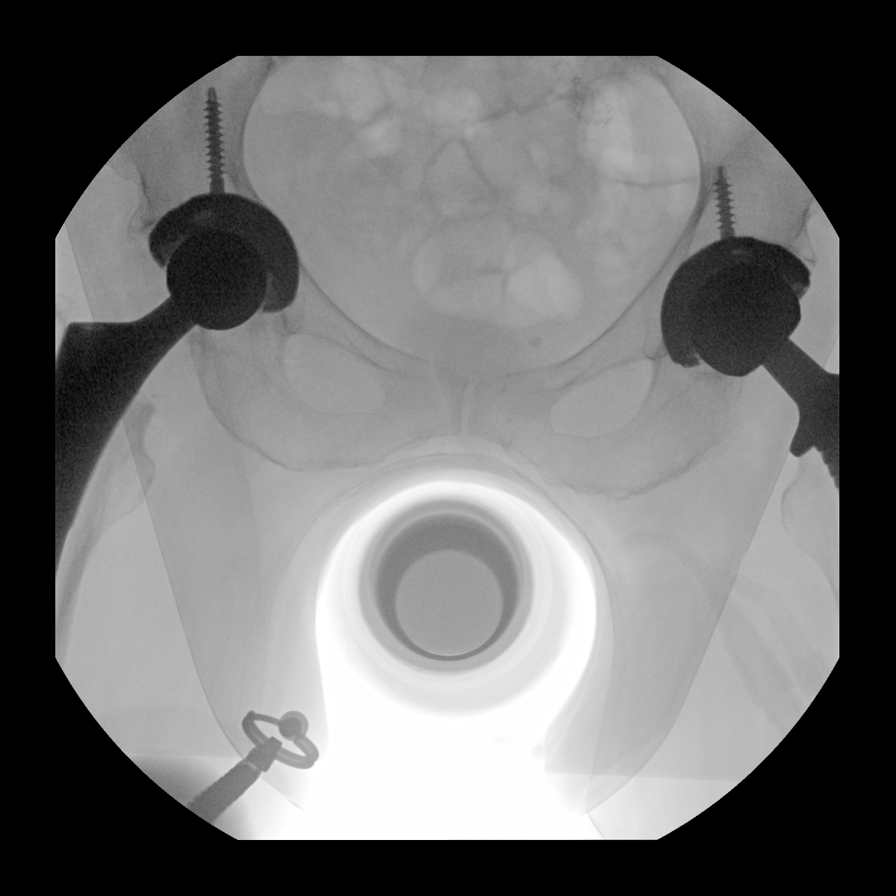

[6 of 6 positions shown; findings below may reference images not displayed]

FINDINGS: Six intraoperative fluoroscopic images were obtained of the right
hip. The right femoral and acetabular components are well situated.
IMPRESSION: Status post right total hip arthroplasty.

## 2022-09-09 IMAGING — DX DG PORTABLE PELVIS
1 series · 1 of 1 positions shown · non-contrast
Comparison: Right hip radiographs 08/09/2020

CLINICAL DATA: S/P right total hip replacement. Image done in PACU.

EXAM:
PORTABLE PELVIS 1-2 VIEWS

[pelvis ap]
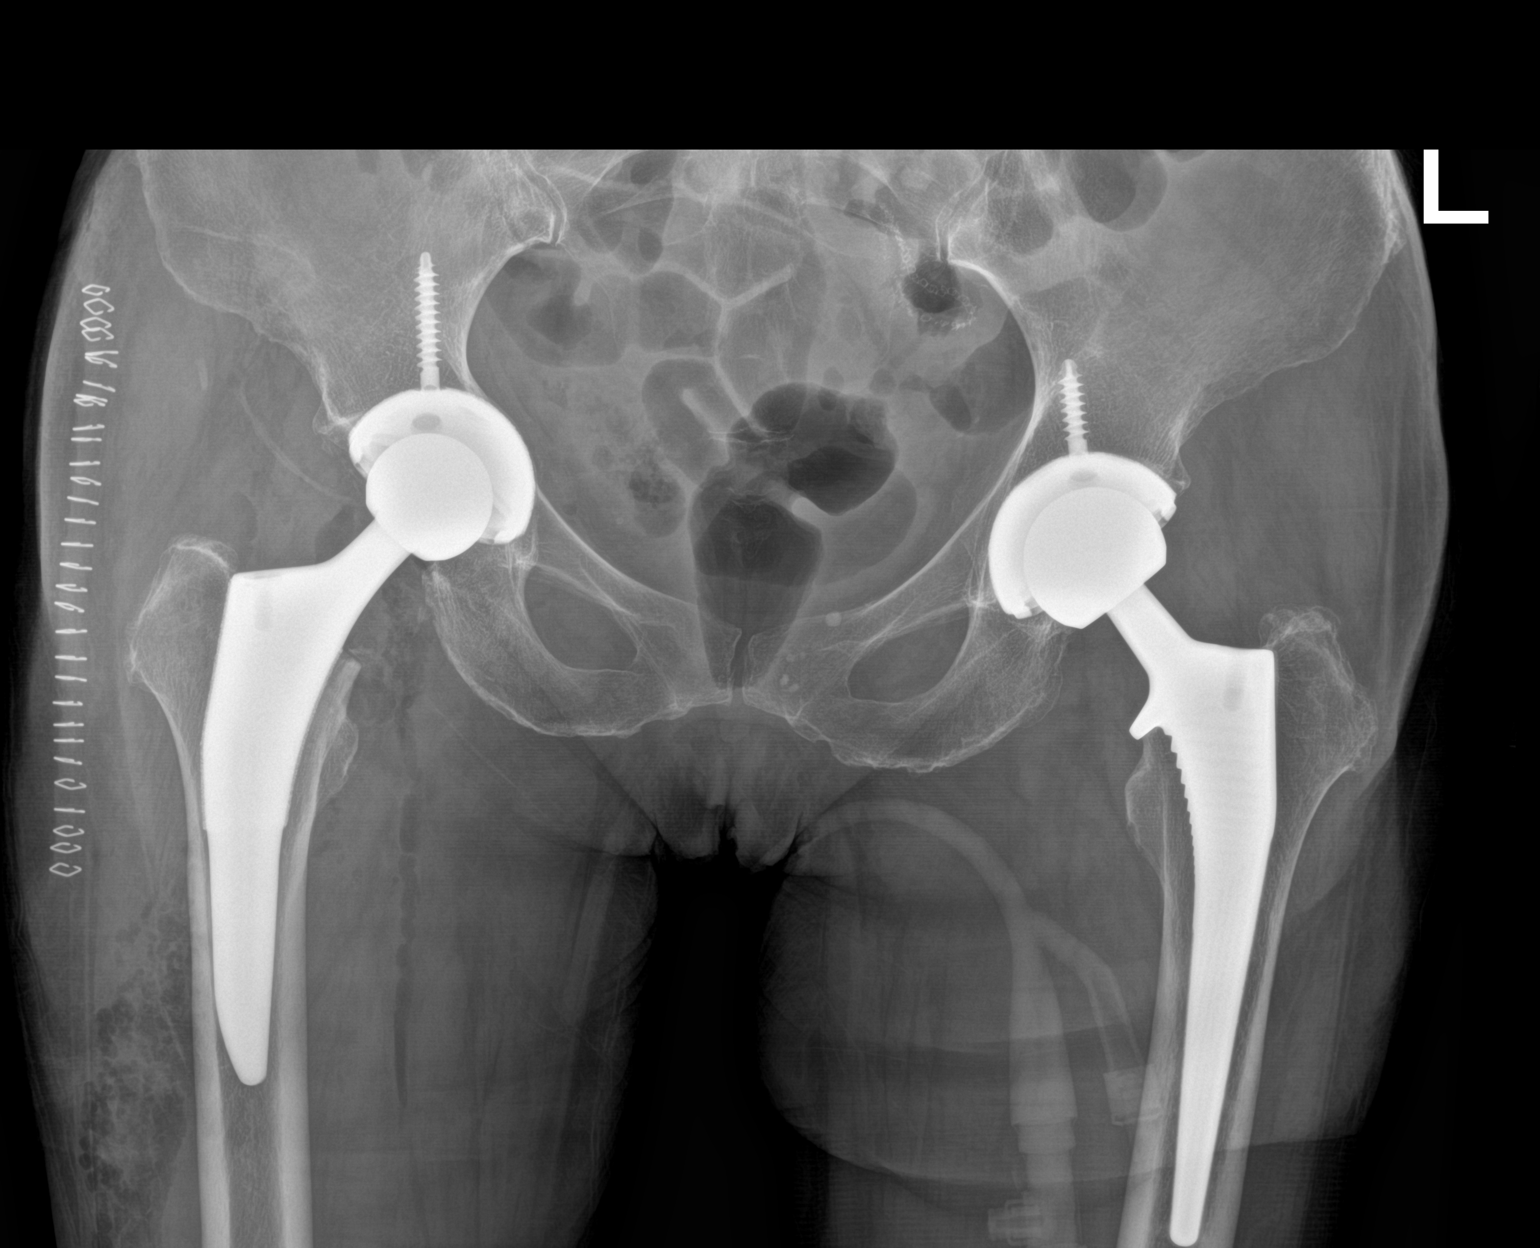

[1 of 1 positions shown; findings below may reference images not displayed]

FINDINGS: Status post right hip replacement with appropriate positioning and
intact appearance of the hardware. Patient also has a left hip
prosthesis. No new acute finding in the bony pelvis. There is
postoperative air in the regional soft tissues and overlying skin
staple line.
IMPRESSION: Status post right hip replacement without evidence of complication.
For

## 2022-09-10 DIAGNOSIS — H5213 Myopia, bilateral: Secondary | ICD-10-CM | POA: Diagnosis not present

## 2022-09-10 DIAGNOSIS — Z0100 Encounter for examination of eyes and vision without abnormal findings: Secondary | ICD-10-CM | POA: Diagnosis not present

## 2022-09-10 IMAGING — DX DG CHEST 1V PORT
1 series · 1 of 1 positions shown · non-contrast
Comparison: Chest radiograph 04/30/2018

CLINICAL DATA: Dyspnea.

EXAM:
PORTABLE CHEST 1 VIEW

[chest ap]
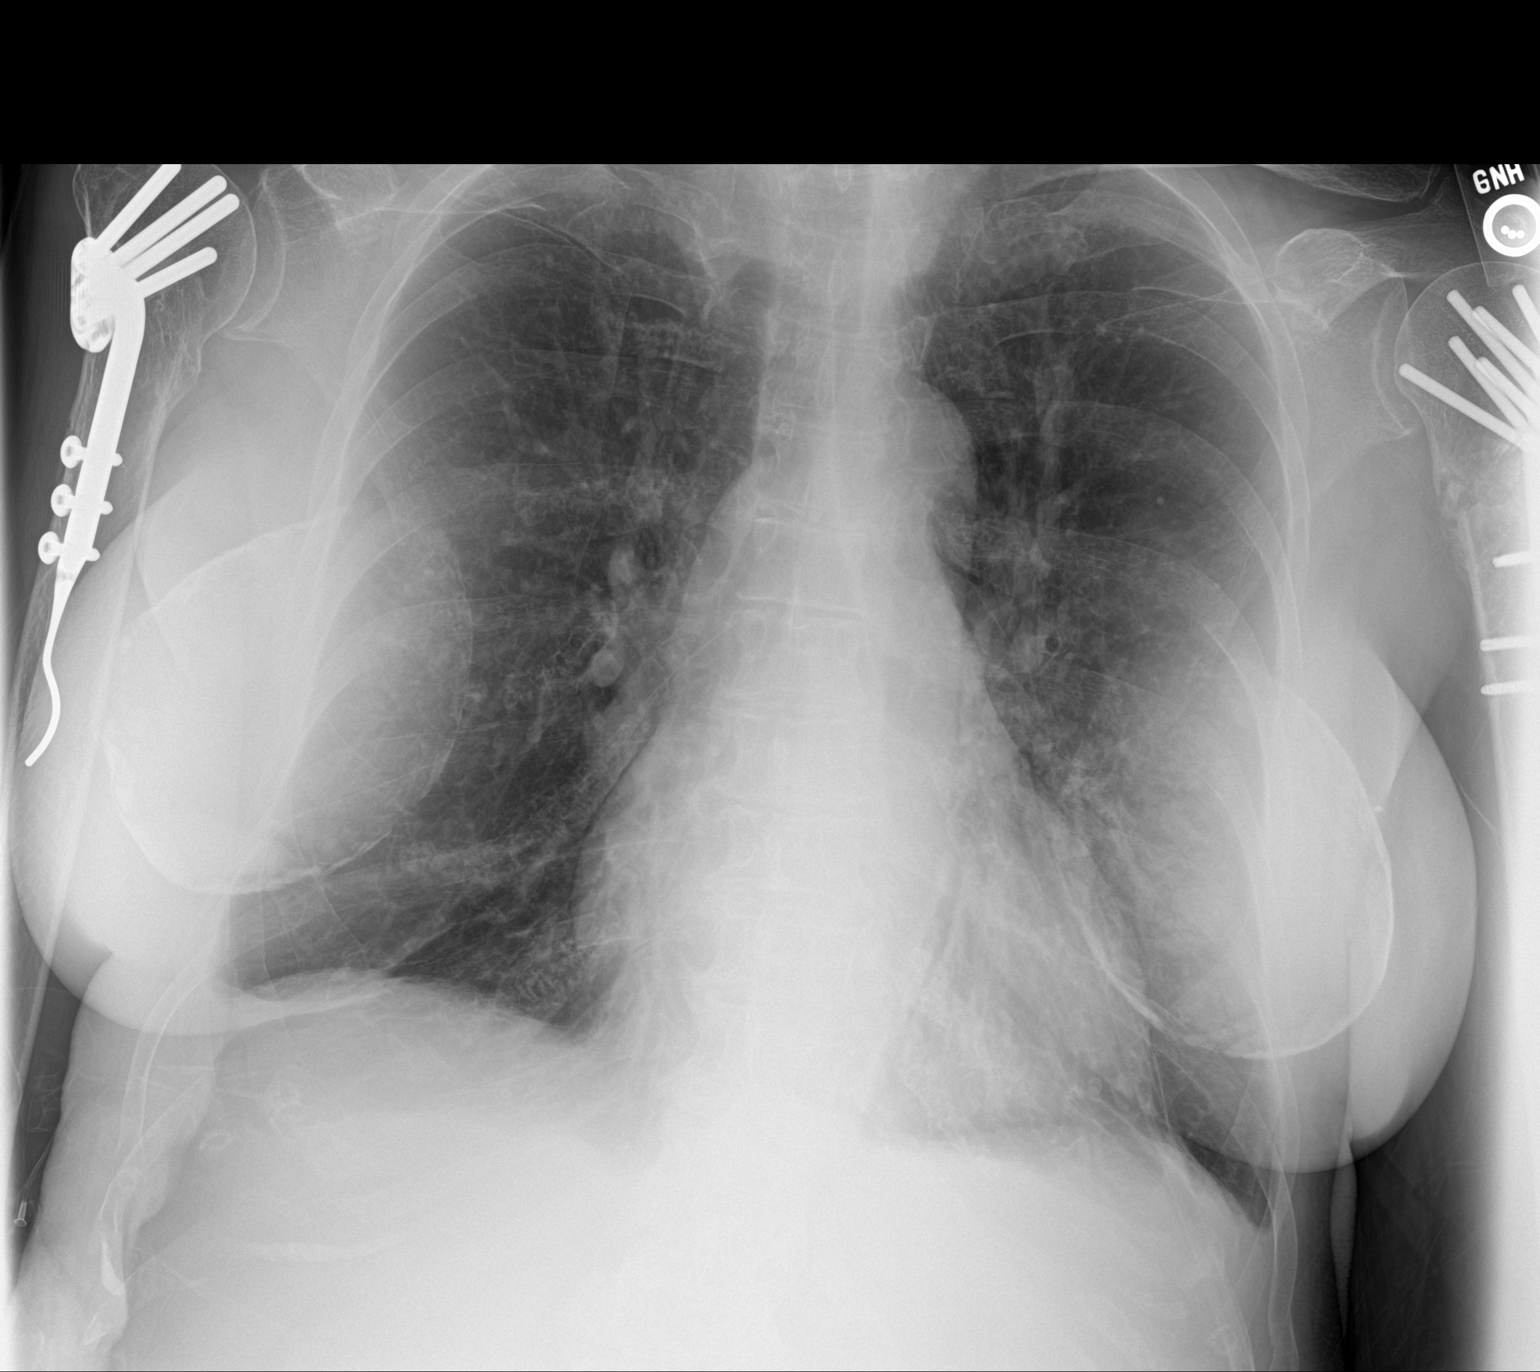

[1 of 1 positions shown; findings below may reference images not displayed]

FINDINGS: Stable cardiomediastinal contours with normal heart size. There are
stable coarse bilateral interstitial opacities likely related to
tobacco abuse. No new focal consolidation. No pneumothorax or large
pleural effusion. Calcified bilateral breast implants. Orthopedic
hardware noted in the bilateral proximal humeri.
IMPRESSION: No acute cardiopulmonary abnormality.  Chronic bronchitic changes.

## 2022-09-22 ENCOUNTER — Encounter: Payer: Self-pay | Admitting: Internal Medicine

## 2022-09-22 ENCOUNTER — Ambulatory Visit: Payer: No Typology Code available for payment source | Attending: Internal Medicine | Admitting: Internal Medicine

## 2022-09-22 VITALS — BP 135/90 | HR 66 | Ht 65.0 in | Wt 136.8 lb

## 2022-09-22 DIAGNOSIS — Z720 Tobacco use: Secondary | ICD-10-CM

## 2022-09-22 DIAGNOSIS — E785 Hyperlipidemia, unspecified: Secondary | ICD-10-CM | POA: Diagnosis not present

## 2022-09-22 DIAGNOSIS — I773 Arterial fibromuscular dysplasia: Secondary | ICD-10-CM | POA: Diagnosis not present

## 2022-09-22 DIAGNOSIS — I2510 Atherosclerotic heart disease of native coronary artery without angina pectoris: Secondary | ICD-10-CM | POA: Diagnosis not present

## 2022-09-22 DIAGNOSIS — R030 Elevated blood-pressure reading, without diagnosis of hypertension: Secondary | ICD-10-CM | POA: Diagnosis not present

## 2022-09-22 DIAGNOSIS — I471 Supraventricular tachycardia, unspecified: Secondary | ICD-10-CM

## 2022-09-22 DIAGNOSIS — R0609 Other forms of dyspnea: Secondary | ICD-10-CM | POA: Diagnosis not present

## 2022-09-22 NOTE — Progress Notes (Signed)
Cardiology Office Note:    Date:  09/22/2022   ID:  Dorothy White, DOB March 25, 1951, MRN [id number]  PCP:  Jonathon Jordan, MD   [facility] Providers Cardiologist:  Lenna Sciara, MD Referring MD: Jonathon Jordan, MD   Chief Complaint/Reason for Referral: Palpitations and dyspnea  ASSESSMENT:    1. Dyspnea on exertion   2. Mild CAD   3. Hyperlipidemia LDL goal <70   4. Tobacco abuse   5. Arterial fibromuscular dysplasia (HCC)   6. SVT (supraventricular tachycardia)   7. Elevated blood pressure reading      PLAN:    In order of problems listed above: 1.  Dyspnea:  She has had a reassuring cardiac evaluation.  Her shortness of breath is much improved. 2.  Mild coronary artery disease: Continue aspirin and statin as well as strict blood pressure control. 3.  Hyperlipidemia: Check lipid panel, LFTs, and LP(a) today.   4.  Tobacco abuse: I did speak to the patient about quitting smoking.  I have asked her to follow-up with her PCP regarding obtaining a Chantix prescription.  I will obtain a abdominal ultrasound to screen for abdominal aortic aneurysm. 5.  Fibromuscular dysplasia: Recent carotid ultrasound done last year demonstrated possible fibromuscular dysplasia.  Continue aspirin, statin, and strict blood pressure control and reiterated need for abstinence from tobacco. 6.  SVT: Continue propranolol.   7.  Elevated blood pressure reading: Patient's low back pain which is a chronic issue has been exacerbated.  Her repeat blood pressure was improved.  I have asked her to follow-up with her PCP if her back pain persists.     Dispo:  Return in about 9 months (around 06/23/2023).      Medication Adjustments/Labs and Tests Ordered: Current medicines are reviewed at length with the patient today.  Concerns regarding medicines are outlined above.  The following changes have been made:     Labs/tests ordered: Orders Placed This Encounter  Procedures   Lipoprotein A  (LPA)   Lipid panel   Hepatic function panel   VAS [place] AAA DUPLEX    Medication Changes: No orders of the defined types were placed in this encounter.    Current medicines are reviewed at length with the patient today.  The patient does not have concerns regarding medicines.   History of Present Illness:    FOCUSED PROBLEM LIST:   1.  CTA 2023 with calcium score of 0 with aortic atherosclerosis 2.  Hyperlipidemia 3.  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  Tobacco abuse  May 2023: Patient seen for initial consultation regarding palpitations and dyspnea.  She was referred for coronary CTA, echocardiogram, event monitor.  Coronary CTA showed calcium score of 0 with no obstructive coronary artery disease however did show aortic atherosclerosis.  Monitor showed episodes of supraventricular tachycardia.  She was started on diltiazem 120 mg daily.  Echocardiogram showed normal LV function and RV function however mildly elevated PA systolic pressure.  Carotid Dopplers were also negative for obstructive disease.   Today: In the interim the patient had a monitor done which demonstrated SVT.  She was started on diltiazem but developed migraines from this and was eventually changed to propranolol.  Was seen in the emergency department in September with chest pain that was thought to be noncardiac in nature.  Chest CT was negative.  Her troponins were negative as well and she had just had  a coronary CTA demonstrating reassuring findings.  The patient is doing well.  Her shortness of breath is improved she denies any chest pain, lightheadedness, presyncope, or syncope.  She denies any claudication.  Apparently she strained her back and is having a lot of low back pain today and she thinks that is why her blood pressure is elevated today.  She continues to smoke about a half a pack a day and would like to quit.   Previously when she was on bupropion she was able to quit but relapsed when a friend of hers died.  This was about 6 years ago.  Current Medications: Current Meds  Medication Sig   acetaminophen (TYLENOL) 500 MG tablet Take 1,000 mg by mouth every 8 (eight) hours as needed for headache (pain).   aspirin EC 81 MG tablet Take 1 tablet (81 mg total) by mouth daily. Swallow whole.   atorvastatin (LIPITOR) 40 MG tablet Take 1 tablet (40 mg total) by mouth at bedtime.   Cholecalciferol (VITAMIN D3) 50 MCG (2000 UT) capsule Take 2,000 Units by mouth every morning. 1 capsule   docusate sodium (COLACE) 100 MG capsule Take 1 capsule (100 mg total) by mouth daily as needed for mild constipation.   ferrous sulfate 325 (65 FE) MG tablet Take 1 tablet (325 mg total) by mouth daily with breakfast.   FLUoxetine (PROZAC) 40 MG capsule Take 80 mg by mouth every morning.   furosemide (LASIX) 20 MG tablet Take 20 mg by mouth daily as needed for fluid or edema (ankle swelling).   gabapentin (NEURONTIN) 100 MG capsule Take 200 mg by mouth 3 (three) times daily as needed (nerve pain).   metoprolol tartrate (LOPRESSOR) 100 MG tablet Take one tablet by mouth 2 hours before CT scan   mirtazapine (REMERON) 15 MG tablet TAKE 1 TABLET BY MOUTH EVERYDAY AT BEDTIME   omeprazole (PRILOSEC) 20 MG capsule Take 20 mg by mouth every morning.   ondansetron (ZOFRAN) 4 MG tablet Take 4 mg by mouth daily as needed for nausea or vomiting.   oxyCODONE-acetaminophen (PERCOCET) 10-325 MG tablet Take 1 tablet by mouth every 4 (four) hours as needed for pain.   propranolol (INDERAL) 10 MG tablet Take 1 tablet (10 mg total) by mouth 2 (two) times daily.   SUMAtriptan (IMITREX) 100 MG tablet Take 50-100 mg by mouth daily as needed for migraine.   tiZANidine (ZANAFLEX) 4 MG tablet Take 4 mg by mouth 3 (three) times daily as needed for muscle spasms.   traZODone (DESYREL) 100 MG tablet Take 100 mg by mouth at bedtime.   vitamin B-12  (CYANOCOBALAMIN) 1000 MCG tablet Take 1,000 mcg by mouth daily. 1 tablet     Allergies:    Opana [oxymorphone hcl]   Social History:   Social History   Tobacco Use   Smoking status: Some Days    Packs/day: 0.25    Years: 34.00    Total pack years: 8.50    Types: Cigarettes   Smokeless tobacco: Never  Vaping Use   Vaping Use: Never used  Substance Use Topics   Alcohol use: Not Currently    Alcohol/week: 1.0 standard drink of alcohol    Types: 1 Standard drinks or equivalent per week    Comment: occ    Drug use: No    Comment: Denies any drug use 02/19/18     Family Hx: Family History  Problem Relation Age of Onset   Lung cancer Mother 72   Migraines Mother  Heart attack Father        Vague   Hypertension Brother    Esophageal cancer Brother    Diabetes Paternal Grandmother        questionable   Colon cancer Neg Hx    Kidney disease Neg Hx    Liver disease Neg Hx      Review of Systems:   Please see the history of present illness.    All other systems reviewed and are negative.     EKGs/Labs/Other Test Reviewed:    EKG:  EKG performed April 2023 that I personally reviewed demonstrates sinus rhythm with nonspecific ST and T wave changes; EKG performed today that I personally reviewed demonstrates sinus rhythm with nonspecific ST and T wave changes.   Prior CV studies:   Coronary CTA 2023: 1. Coronary calcium score of 0. This was 1st percentile for age, sex, and race matched control. 2. Normal coronary origin with Right dominance. 3. CAD-RADS 2. Mild non-obstructive CAD (25-49%). Consider non-atherosclerotic causes of chest pain. Consider preventive therapy and risk factor modification despite zero calcium score. 4.  Aortic atherosclerosis   TTE 2023   1. Left ventricular ejection fraction, by estimation, is 60 to 65%. The  left ventricle has normal function. The left ventricle has no regional  wall motion abnormalities. Left ventricular diastolic  parameters are  consistent with Grade I diastolic  dysfunction (impaired relaxation).   2. Right ventricular systolic function is normal. The right ventricular  size is normal. There is mildly elevated pulmonary artery systolic  pressure. The estimated right ventricular systolic pressure is 123456 mmHg.   3. The mitral valve is normal in structure. No evidence of mitral valve  regurgitation. No evidence of mitral stenosis.   4. The aortic valve is tricuspid. Aortic valve regurgitation is not  visualized. No aortic stenosis is present.   5. The inferior vena cava is normal in size with greater than 50%  respiratory variability, suggesting right atrial pressure of 3 mmHg.    Monitor 2023: 19 Supraventricular Tachycardia runs occurred, the run with the fastest interval lasting 9 beats with a max rate of 200 bpm, the  longest lasting 10.1 secs with an avg rate of 142 bpm. Supraventricular Tachycardia was detected within +/- 45 seconds of symptomatic patient event(s).    Isolated SVEs were rare (<1.0%), SVE Couplets were rare (<1.0%), and SVE Triplets were rare (<1.0%).    Isolated VEs were rare (<1.0%), and no VE Couplets or VE Triplets were present.    Patient triggered events corresponded with sinus rhythm and supraventricular tachycardia.   No atrial fibrillation, sustained ventricular tachyarrhythmias, or bradyarrhythmias were detected.   Carotid ultrasound 2023 Right Carotid: There is no evidence of stenosis in the right ICA. Bead-like appearance in the distal segment with mildly elevated velocities suggestive for fibromuscular dysplasia.   Left Carotid: There is no evidence of stenosis in the left ICA. Bead-like appearance in the distal segment with elevated velocities suggestive for fibromuscular dysplasia.   Vertebrals:  Bilateral vertebral arteries demonstrate antegrade flow.  Subclavians: Normal flow hemodynamics were seen in bilateral subclavian arteries.    Coronary angiography  2017 with mild mid LAD disease   Other studies Reviewed: Review of the additional studies/records demonstrates: CT chest 2018 without aortic atherosclerosis of coronary artery calcification  Recent Labs: 01/21/2022: ALT 10 04/22/2022: BUN 11; Creatinine, Ser 0.67; Hemoglobin 14.2; Magnesium 1.9; Platelets 251; Potassium 4.4; Sodium 142; TSH 1.336   Recent Lipid Panel Lab Results  Component Value  Date/Time   CHOL 178 02/18/2017 07:54 PM   CHOL 243 (H) 08/15/2013 08:39 AM   TRIG 139 02/18/2017 07:54 PM   HDL 48 02/18/2017 07:54 PM   HDL 55 08/15/2013 08:39 AM   LDLCALC 102 (H) 02/18/2017 07:54 PM   LDLCALC 142 (H) 08/15/2013 08:39 AM    Risk Assessment/Calculations:           Physical Exam:    VS:  BP (!) 135/90   Pulse 66   Ht '5\' 5"'$  (1.651 m)   Wt 136 lb 12.8 oz (62.1 kg)   SpO2 98%   BMI 22.76 kg/m     HYPERTENSION CONTROL Vitals:   09/22/22 1406 09/22/22 1428  BP: (!) 180/87 (!) 135/90    The patient's blood pressure is elevated above target today.  In order to address the patient's elevated BP: Blood pressure will be monitored at home to determine if medication changes need to be made.      Wt Readings from Last 3 Encounters:  09/22/22 136 lb 12.8 oz (62.1 kg)  04/18/22 130 lb 6.4 oz (59.1 kg)  04/16/22 132 lb (59.9 kg)    GENERAL:  No apparent distress, AOx3 HEENT:  No carotid bruits, +2 carotid impulses, no scleral icterus CAR: RRR no murmurs, gallops, rubs, or thrills RES:  Clear to auscultation bilaterally ABD:  Soft, nontender, nondistended, positive bowel sounds x 4 VASC:  +2 radial pulses, +2 carotid pulses, palpable pedal pulses NEURO:  CN 2-12 grossly intact; motor and sensory grossly intact PSYCH:  No active depression or anxiety EXT:  No edema, ecchymosis, or cyanosis  Signed, Early Osmond, MD  09/22/2022 2:29 PM    [address], [place], [place]  [address] Phone: [phone]; Fax: [phone]    Note:  This document was prepared using Dragon voice recognition software and may include unintentional dictation errors.

## 2022-09-22 NOTE — Patient Instructions (Addendum)
Medication Instructions:  Your physician recommends that you continue on your current medications as directed. Please refer to the Current Medication list given to you today.  *If you need a refill on your cardiac medications before your next appointment, please call your pharmacy*  Lab Work: Your physician recommends that you have lab work today- lipid panel, LFT, and lipoprotein La  If you have labs (blood work) drawn today and your tests are completely normal, you will receive your results only by: [place] (if you have MyChart) OR A paper copy in the mail If you have any lab test that is abnormal or we need to change your treatment, we will call you to review the results.  Testing/Procedures: Your physician has requested that you have an abdominal aorta duplex. During this test, an ultrasound is used to evaluate the aorta. Allow 30 minutes for this exam. Do not eat after midnight the day before and avoid carbonated beverages   Follow-Up: At [facility], you and your health needs are our priority.  As part of our continuing mission to provide you with exceptional heart care, we have created designated Provider Care Teams.  These Care Teams include your primary Cardiologist (physician) and Advanced Practice Providers (APPs -  Physician Assistants and Nurse Practitioners) who all work together to provide you with the care you need, when you need it.  We recommend signing up for the patient portal called "MyChart".  Sign up information is provided on this After Visit Summary.  MyChart is used to connect with patients for Virtual Visits (Telemedicine).  Patients are able to view lab/test results, encounter notes, upcoming appointments, etc.  Non-urgent messages can be sent to your provider as well.   To learn more about what you can do with MyChart, go to [email or web address].    Your next appointment:   9 month(s)  Provider:   APP (NP or PA)

## 2022-09-23 LAB — HEPATIC FUNCTION PANEL
ALT: 14 IU/L (ref 0–32)
AST: 15 IU/L (ref 0–40)
Albumin: 4.6 g/dL (ref 3.8–4.8)
Alkaline Phosphatase: 171 IU/L — ABNORMAL HIGH (ref 44–121)
Bilirubin Total: 0.4 mg/dL (ref 0.0–1.2)
Bilirubin, Direct: <0.1 mg/dL (ref 0.00–0.40)
Total Protein: 6.7 g/dL (ref 6.0–8.5)

## 2022-09-23 LAB — LIPID PANEL
Chol/HDL Ratio: 2.9 ratio (ref 0.0–4.4)
Cholesterol, Total: 183 mg/dL (ref 100–199)
HDL: 64 mg/dL (ref >39)
LDL Chol Calc (NIH): 97 mg/dL (ref 0–99)
Triglycerides: 127 mg/dL (ref 0–149)
VLDL Cholesterol Cal: 22 mg/dL (ref 5–40)

## 2022-09-23 LAB — LIPOPROTEIN A (LPA): Lipoprotein (a): 179.7 nmol/L — ABNORMAL HIGH (ref <75.0)

## 2022-09-24 ENCOUNTER — Other Ambulatory Visit: Payer: Self-pay | Admitting: Internal Medicine

## 2022-09-24 ENCOUNTER — Encounter: Payer: Self-pay | Admitting: Internal Medicine

## 2022-09-26 DIAGNOSIS — J310 Chronic rhinitis: Secondary | ICD-10-CM | POA: Diagnosis not present

## 2022-09-26 DIAGNOSIS — G4700 Insomnia, unspecified: Secondary | ICD-10-CM | POA: Diagnosis not present

## 2022-09-26 DIAGNOSIS — F330 Major depressive disorder, recurrent, mild: Secondary | ICD-10-CM | POA: Diagnosis not present

## 2022-09-26 DIAGNOSIS — I428 Other cardiomyopathies: Secondary | ICD-10-CM | POA: Diagnosis not present

## 2022-09-26 DIAGNOSIS — L989 Disorder of the skin and subcutaneous tissue, unspecified: Secondary | ICD-10-CM | POA: Diagnosis not present

## 2022-09-26 DIAGNOSIS — F411 Generalized anxiety disorder: Secondary | ICD-10-CM | POA: Diagnosis not present

## 2022-09-26 DIAGNOSIS — E785 Hyperlipidemia, unspecified: Secondary | ICD-10-CM | POA: Diagnosis not present

## 2022-09-26 DIAGNOSIS — I471 Supraventricular tachycardia, unspecified: Secondary | ICD-10-CM | POA: Diagnosis not present

## 2022-09-26 DIAGNOSIS — F17200 Nicotine dependence, unspecified, uncomplicated: Secondary | ICD-10-CM | POA: Diagnosis not present

## 2022-10-03 ENCOUNTER — Other Ambulatory Visit: Payer: Self-pay

## 2022-10-03 ENCOUNTER — Emergency Department (HOSPITAL_COMMUNITY)
Admission: EM | Admit: 2022-10-03 | Discharge: 2022-10-03 | Disposition: A | Discharge status: Home / Self Care | Payer: No Typology Code available for payment source | Attending: Emergency Medicine | Admitting: Emergency Medicine

## 2022-10-03 ENCOUNTER — Emergency Department (HOSPITAL_COMMUNITY): Payer: No Typology Code available for payment source

## 2022-10-03 ENCOUNTER — Encounter (HOSPITAL_COMMUNITY): Payer: Self-pay | Admitting: Radiology

## 2022-10-03 DIAGNOSIS — S0003XA Contusion of scalp, initial encounter: Secondary | ICD-10-CM | POA: Diagnosis not present

## 2022-10-03 DIAGNOSIS — R220 Localized swelling, mass and lump, head: Secondary | ICD-10-CM | POA: Diagnosis not present

## 2022-10-03 DIAGNOSIS — Z8522 Personal history of malignant neoplasm of nasal cavities, middle ear, and accessory sinuses: Secondary | ICD-10-CM | POA: Diagnosis not present

## 2022-10-03 DIAGNOSIS — Z1152 Encounter for screening for COVID-19: Secondary | ICD-10-CM | POA: Diagnosis not present

## 2022-10-03 DIAGNOSIS — R42 Dizziness and giddiness: Secondary | ICD-10-CM | POA: Insufficient documentation

## 2022-10-03 DIAGNOSIS — Y9301 Activity, walking, marching and hiking: Secondary | ICD-10-CM | POA: Diagnosis not present

## 2022-10-03 DIAGNOSIS — I2510 Atherosclerotic heart disease of native coronary artery without angina pectoris: Secondary | ICD-10-CM | POA: Insufficient documentation

## 2022-10-03 DIAGNOSIS — M47812 Spondylosis without myelopathy or radiculopathy, cervical region: Secondary | ICD-10-CM | POA: Diagnosis not present

## 2022-10-03 DIAGNOSIS — Z7982 Long term (current) use of aspirin: Secondary | ICD-10-CM | POA: Insufficient documentation

## 2022-10-03 DIAGNOSIS — M545 Low back pain, unspecified: Secondary | ICD-10-CM | POA: Insufficient documentation

## 2022-10-03 DIAGNOSIS — Z043 Encounter for examination and observation following other accident: Secondary | ICD-10-CM | POA: Diagnosis not present

## 2022-10-03 DIAGNOSIS — S0083XA Contusion of other part of head, initial encounter: Secondary | ICD-10-CM | POA: Insufficient documentation

## 2022-10-03 DIAGNOSIS — W0110XA Fall on same level from slipping, tripping and stumbling with subsequent striking against unspecified object, initial encounter: Secondary | ICD-10-CM | POA: Insufficient documentation

## 2022-10-03 DIAGNOSIS — Z8673 Personal history of transient ischemic attack (TIA), and cerebral infarction without residual deficits: Secondary | ICD-10-CM | POA: Diagnosis not present

## 2022-10-03 DIAGNOSIS — M8588 Other specified disorders of bone density and structure, other site: Secondary | ICD-10-CM | POA: Diagnosis not present

## 2022-10-03 DIAGNOSIS — M25522 Pain in left elbow: Secondary | ICD-10-CM | POA: Diagnosis present

## 2022-10-03 DIAGNOSIS — J9811 Atelectasis: Secondary | ICD-10-CM | POA: Diagnosis not present

## 2022-10-03 DIAGNOSIS — S51012A Laceration without foreign body of left elbow, initial encounter: Secondary | ICD-10-CM | POA: Insufficient documentation

## 2022-10-03 DIAGNOSIS — G319 Degenerative disease of nervous system, unspecified: Secondary | ICD-10-CM | POA: Diagnosis not present

## 2022-10-03 DIAGNOSIS — W19XXXA Unspecified fall, initial encounter: Secondary | ICD-10-CM

## 2022-10-03 LAB — CBC WITH DIFFERENTIAL/PLATELET
Abs Immature Granulocytes: 0.01 10*3/uL (ref 0.00–0.07)
Basophils Absolute: 0 10*3/uL (ref 0.0–0.1)
Basophils Relative: 0 %
Eosinophils Absolute: 0.1 10*3/uL (ref 0.0–0.5)
Eosinophils Relative: 2 %
HCT: 43 % (ref 36.0–46.0)
Hemoglobin: 13.1 g/dL (ref 12.0–15.0)
Immature Granulocytes: 0 %
Lymphocytes Relative: 30 %
Lymphs Abs: 1.6 10*3/uL (ref 0.7–4.0)
MCH: 27 pg (ref 26.0–34.0)
MCHC: 30.5 g/dL (ref 30.0–36.0)
MCV: 88.7 fL (ref 80.0–100.0)
Monocytes Absolute: 0.6 10*3/uL (ref 0.1–1.0)
Monocytes Relative: 12 %
Neutro Abs: 2.9 10*3/uL (ref 1.7–7.7)
Neutrophils Relative %: 56 %
Platelets: 205 10*3/uL (ref 150–400)
RBC: 4.85 MIL/uL (ref 3.87–5.11)
RDW: 15.4 % (ref 11.5–15.5)
WBC: 5.2 10*3/uL (ref 4.0–10.5)
nRBC: 0 % (ref 0.0–0.2)

## 2022-10-03 LAB — URINALYSIS, W/ REFLEX TO CULTURE (INFECTION SUSPECTED)
Bacteria, UA: NONE SEEN
Bilirubin Urine: NEGATIVE
Glucose, UA: NEGATIVE mg/dL
Hgb urine dipstick: NEGATIVE
Ketones, ur: 20 mg/dL — AB
Leukocytes,Ua: NEGATIVE
Nitrite: NEGATIVE
Protein, ur: NEGATIVE mg/dL
Specific Gravity, Urine: 1.028 (ref 1.005–1.030)
pH: 5 (ref 5.0–8.0)

## 2022-10-03 LAB — RESP PANEL BY RT-PCR (RSV, FLU A&B, COVID)  RVPGX2
Influenza A by PCR: NEGATIVE
Influenza B by PCR: NEGATIVE
Resp Syncytial Virus by PCR: NEGATIVE
SARS Coronavirus 2 by RT PCR: NEGATIVE

## 2022-10-03 LAB — COMPREHENSIVE METABOLIC PANEL
ALT: 19 U/L (ref 0–44)
AST: 23 U/L (ref 15–41)
Albumin: 4 g/dL (ref 3.5–5.0)
Alkaline Phosphatase: 106 U/L (ref 38–126)
Anion gap: 8 (ref 5–15)
BUN: 18 mg/dL (ref 8–23)
CO2: 25 mmol/L (ref 22–32)
Calcium: 8.8 mg/dL — ABNORMAL LOW (ref 8.9–10.3)
Chloride: 105 mmol/L (ref 98–111)
Creatinine, Ser: 0.61 mg/dL (ref 0.44–1.00)
GFR, Estimated: >60 mL/min (ref >60)
Glucose, Bld: 115 mg/dL — ABNORMAL HIGH (ref 70–99)
Potassium: 4 mmol/L (ref 3.5–5.1)
Sodium: 138 mmol/L (ref 135–145)
Total Bilirubin: 0.7 mg/dL (ref 0.3–1.2)
Total Protein: 6.8 g/dL (ref 6.5–8.1)

## 2022-10-03 LAB — TROPONIN I (HIGH SENSITIVITY): Troponin I (High Sensitivity): 2 ng/L (ref <18)

## 2022-10-03 MED ORDER — LORAZEPAM 2 MG/ML IJ SOLN
0.5000 mg | Freq: Once | INTRAMUSCULAR | Status: AC | PRN
  Administered 2022-10-03: 0.5 mg via INTRAVENOUS
  Filled 2022-10-03: qty 1

## 2022-10-03 MED ORDER — FENTANYL CITRATE PF 50 MCG/ML IJ SOSY
25.0000 ug | PREFILLED_SYRINGE | Freq: Once | INTRAMUSCULAR | Status: AC
  Administered 2022-10-03: 25 ug via INTRAVENOUS
  Filled 2022-10-03: qty 1

## 2022-10-03 NOTE — ED Notes (Signed)
Pt requesting something for pain.  MD notified.

## 2022-10-03 NOTE — ED Notes (Signed)
Pt ambulated to BR with X 1 assistance. Gait is unsteady.

## 2022-10-03 NOTE — ED Provider Triage Note (Signed)
Emergency Medicine Provider Triage Evaluation Note  Dorothy White , a 72 y.o. female  was evaluated in triage.  Pt complains of fall.  Patient states yesterday she had 3 falls, she denies any prodromal symptoms.  She is unsure what caused her to fall but she did hit her head, unsure if she lost consciousness before or after hitting her head.  She has pain to the left elbow, low back, headache and neck pain.  Not on blood thinners.  Denies any recent changes in medication..  Review of Systems  Per HPI  Physical Exam  BP (!) 165/87 (BP Location: Left Arm)   Pulse 89   Temp 99 F (37.2 C) (Oral)   Resp 16   SpO2 97%  Gen:   Awake, no distress   Resp:  Normal effort  MSK:   Moves extremities without difficulty  Other:  Forehead contusion, abrasion left elbow  Medical Decision Making  Medically screening exam initiated at 12:11 PM.  Appropriate orders placed.  Dorothy White was informed that the remainder of the evaluation will be completed by another provider, this initial triage assessment does not replace that evaluation, and the importance of remaining in the ED until their evaluation is complete.     Sherrill Raring, PA-C 10/03/22 1211

## 2022-10-03 NOTE — ED Notes (Signed)
EDP at bedside assessing pt. °

## 2022-10-03 NOTE — ED Notes (Signed)
Pt requesting more pain meds.   MD aware

## 2022-10-03 NOTE — Discharge Instructions (Addendum)
You were seen in the emergency department for your dizziness and near falls.  Your workup showed no obvious injuries from her fall in no signs of stroke or obvious explanation for your dizziness.  You can take Tylenol or Motrin as needed for pain and you should follow-up with your primary doctor in the next few days to have your symptoms rechecked.  You should return to the emergency department if you are continuing to fall and you are unable to get yourself up or you injure yourself, you have numbness or weakness on one side the body compared to the other, or if you have any other new or concerning symptoms.

## 2022-10-03 NOTE — ED Provider Notes (Signed)
Patient signed out to me at 1600 by Dr. Billy Fischer pending MRI.  In short this is a 73 year old female with a past medical history of hypertension, hyperlipidemia, CAD, migraines presenting to the emergency department with frequent falls and feeling off balance.  Patient's workup here showed no obvious acute traumatic injury, labs within normal range.  The patient was unsteady on ambulating was planned for MRI to evaluate for possible stroke.  Clinical Course as of 10/03/22 [id number]  Dorothy White Oct 03, 2022  1812 No acute abnormality on MRI [VK]  1818 Patient reports that she is feeling well at this time.  She states that she does not live alone and has a cane and walker at home.  She states that she would prefer discharge home with primary care follow-up.  She was given strict return precautions. [VK]    Clinical Course User Index [VK] Kemper Durie, DO      Kemper Durie, [place] 10/03/22 Dorothy White

## 2022-10-03 NOTE — ED Notes (Signed)
AVS provided to and discussed with patient. Pt verbalizes understanding of discharge instructions and denies any questions or concerns at this time. Pt has ride home. Pt ambulated out of department independently. Pt tearful while discussing discharge. Pt insists that she is safe at home and states, "Oh, that's been happening lately. Must be allergies." No distress noted.

## 2022-10-03 NOTE — ED Triage Notes (Signed)
Pt arrived via POV. Pt had 3x falls last night, pt stated no LOC, but was unsure why they fell. Pt has bruising on forehead, and wound on L elbow.  Not on blood thinners.  AOx4

## 2022-10-03 NOTE — ED Provider Notes (Signed)
[place] EMERGENCY DEPARTMENT AT [facility] Provider Note   CSN: [id number] Arrival date & time: 10/03/22  1149     History  Chief Complaint  Patient presents with   Fall   Head Injury    Dorothy White is a 72 y.o. female.  HPI      Started feeling off balance around 8PM but is bad at time at night When try to walk would fall Fell 3 times First time hit the right side of head, second time the left, and third time back of the head.  Thinks was knocked out once.  Was able to get up after crawling onto something to grab onto.   Now just feels sort of spacey, weird No numbness/weakness Left ankle hurt before fall No change in vision, no trouble talking  Hoarseness started yesterday, some trouble swallowing yesterday, hard to get the mucus down Hoarseness, kind of feels sore but not bad yesterday afternoon   No fever,  Has allergies, has had congestion esp with eating A little cough, mucus No chest pain or dyspnea, no pain with urination  Neck pain, low back, left elbow, and also headache. Has chronic back pain, bulging discs  Had headaches, has hx of migraines, yesterday began  Take fluoxetine, atorvastatin was increased, propranolol was sort of new started about one month ago  Past Medical History:  Diagnosis Date   Anemia    ?   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 25% mLAD, otherwise no sig disease 01/2016 cath.    NICM (nonischemic cardiomyopathy) ([place])    a. EF 40-45% by echo 123456 at time of complicated migraine/neuro sx, 55-65% at time of cath 01/2016   Stroke [facility]) 06/2013   "mini" stroke , ?possibly hemaplegic migraine per pt      Home Medications Prior to Admission medications   Medication Sig Start Date End Date Taking? Authorizing Provider  acetaminophen (TYLENOL) 500 MG tablet Take 1,000 mg by mouth every 8 (eight) hours as needed for headache (pain).    [provider]  aspirin EC 81 MG tablet Take 1 tablet (81 mg total) by mouth daily. Swallow whole. 12/16/21   Early Osmond, MD  atorvastatin (LIPITOR) 40 MG tablet Take 1 tablet (40 mg total) by mouth at bedtime. 01/23/22   Aline August, MD  Cholecalciferol (VITAMIN D3) 50 MCG (2000 UT) capsule Take 2,000 Units by mouth every morning. 1 capsule 05/07/21   [provider]  docusate sodium (COLACE) 100 MG capsule Take 1 capsule (100 mg total) by mouth daily as needed for mild constipation. 01/23/22   Aline August, MD  ferrous sulfate 325 (65 FE) MG tablet Take 1 tablet (325 mg total) by mouth daily with breakfast. 01/24/22   Aline August, MD  FLUoxetine (PROZAC) 40 MG capsule Take 80 mg by mouth every  morning. 04/18/18   [provider]  furosemide (LASIX) 20 MG tablet Take 20 mg by mouth daily as needed for fluid or edema (ankle swelling). 10/25/21   [provider]  gabapentin (NEURONTIN) 100 MG capsule Take 200 mg by mouth 3 (three) times daily as needed (nerve pain).    [provider]  metoprolol tartrate (LOPRESSOR) 100 MG tablet Take one tablet by mouth 2 hours before CT scan 02/05/22   Early Osmond, MD  mirtazapine (REMERON) 15 MG tablet TAKE 1 TABLET BY MOUTH EVERYDAY AT BEDTIME 10/28/21   Tat, Eustace Quail, DO  omeprazole (PRILOSEC) 20 MG capsule Take 20 mg by mouth every morning.    [provider]  ondansetron (ZOFRAN) 4 MG tablet Take 4 mg by mouth daily as needed for nausea  or vomiting. 11/15/21   [provider]  oxyCODONE-acetaminophen (PERCOCET) 10-325 MG tablet Take 1 tablet by mouth every 4 (four) hours as needed for pain. 01/08/21   [provider]  propranolol (INDERAL) 10 MG tablet Take 1 tablet (10 mg total) by mouth 2 (two) times daily. 04/22/22   Rancour, Annie Main, MD  SUMAtriptan (IMITREX) 100 MG tablet Take 50-100 mg by mouth daily as needed for migraine. 12/24/18   [provider]  tiZANidine (ZANAFLEX) 4 MG tablet Take 4 mg by mouth 3 (three) times daily as needed for muscle spasms. 04/10/22   [provider]  traZODone (DESYREL) 100 MG tablet Take 100 mg by mouth at bedtime. 09/25/21   [provider]  vitamin B-12 (CYANOCOBALAMIN) 1000 MCG tablet Take 1,000 mcg by mouth daily. 1 tablet 05/07/21   [provider]      Allergies    Opana [oxymorphone hcl]    Review of Systems   Review of Systems  Physical Exam Updated Vital Signs BP (!) 162/75   Pulse 85   Temp 98.7 F (37.1 C) (Oral)   Resp 16   SpO2 93%  Physical Exam Vitals and nursing note reviewed.  Constitutional:      General: She is not in acute distress.    Appearance: She is well-developed. She is not diaphoretic.  HENT:     Head: Normocephalic.     Comments: Multiple contusions  Eyes:     Conjunctiva/sclera: Conjunctivae normal.  Cardiovascular:     Rate and Rhythm: Normal rate and regular rhythm.     Heart sounds: Normal heart sounds. No murmur heard.    No friction rub. No gallop.  Pulmonary:     Effort: Pulmonary effort is normal. No respiratory distress.     Breath sounds: Normal breath sounds. No wheezing or rales.  Abdominal:     General: There is no distension.     Palpations: Abdomen is soft.     Tenderness: There is no abdominal tenderness. There is no guarding.  Musculoskeletal:        General: Tenderness (lower back (also reports chronic)) present.     Cervical back: Normal range of motion.  Skin:     General: Skin is warm and dry.     Findings: No erythema or rash.     Comments: Skin tear left elbow  Neurological:     Mental Status: She is alert and oriented to person, place, and time.     Cranial Nerves: No cranial nerve deficit.     Sensory: No sensory deficit.     Motor: Weakness: reports some pain lifting RLE.     Coordination: Coordination normal.  Gait: Gait abnormal.     ED Results / Procedures / Treatments   Labs (all labs ordered are listed, but only abnormal results are displayed) Labs Reviewed  COMPREHENSIVE METABOLIC PANEL - Abnormal; Notable for the following components:      Result Value   Glucose, Bld 115 (*)    Calcium 8.8 (*)    All other components within normal limits  URINALYSIS, W/ REFLEX TO CULTURE (INFECTION SUSPECTED) - Abnormal; Notable for the following components:   Ketones, ur 20 (*)    All other components within normal limits  RESP PANEL BY RT-PCR (RSV, FLU A&B, COVID)  [id number]  CBC WITH DIFFERENTIAL/PLATELET  URINALYSIS, ROUTINE W REFLEX MICROSCOPIC  TROPONIN I (HIGH SENSITIVITY)    EKG EKG Interpretation  Date/Time:  Friday October 03 2022 12:36:31 EST Ventricular Rate:  81 PR Interval:    QRS Duration: 89 QT Interval:  382 QTC Calculation: 444 R Axis:   29 Text Interpretation: Artifact, poor tracing, unclear if sinus Borderline repolarization abnormality Confirmed by Gareth Morgan [phone]) on 10/03/2022 1:51:02 PM  Radiology CT Cervical Spine Wo Contrast  Result Date: 10/03/2022 CLINICAL DATA:  Trauma, fall EXAM: CT CERVICAL SPINE WITHOUT CONTRAST TECHNIQUE: Multidetector CT imaging of the cervical spine was performed without intravenous contrast. Multiplanar CT image reconstructions were also generated. RADIATION DOSE REDUCTION: This exam was performed according to the departmental dose-optimization program which includes automated exposure control, adjustment of the mA and/or kV according to patient size and/or use of iterative  reconstruction technique. COMPARISON:  01/02/2021 FINDINGS: Alignment: Alignment of posterior margins of vertebral bodies he is within normal limits. Skull base and vertebrae: No fracture is seen. Soft tissues and spinal canal: There is mild extrinsic pressure over the ventral margin of thecal sac caused by posterior bony spurs at C6-C7 level. Anterior bony spurs are noted at multiple levels in cervical spine. Disc levels: There is minimal encroachment of right neural foramen at C2-C3 level. There is mild to moderate encroachment of neural foramina at C6-C7 level. Upper chest: No acute findings are seen. Other: None. IMPRESSION: No recent fracture is seen in cervical spine. Cervical spondylosis, more prominent at C6-C7 level. Electronically Signed   By: Elmer Picker M.D.   On: 10/03/2022 15:13   CT Maxillofacial Wo Contrast  Result Date: 10/03/2022 CLINICAL DATA:  Trauma, fall EXAM: CT MAXILLOFACIAL WITHOUT CONTRAST TECHNIQUE: Multidetector CT imaging of the maxillofacial structures was performed. Multiplanar CT image reconstructions were also generated. RADIATION DOSE REDUCTION: This exam was performed according to the departmental dose-optimization program which includes automated exposure control, adjustment of the mA and/or kV according to patient size and/or use of iterative reconstruction technique. COMPARISON:  01/02/2021 FINDINGS: Osseous: No recent displaced fractures are seen. Mild deformity in the nasal bones may be residual from previous injury. Deformity in the anterior nasal spine of maxilla may be residual from previous injury. Degenerative changes are noted in right TMJ. Orbits: Optic globes are symmetrical. Retrobulbar soft tissues are unremarkable. Sinuses: There are no air-fluid levels or significant mucosal thickening. Defect in the medial wall of left maxillary sinus may be a congenital variation or residual change from previous intervention. Soft tissues: There is subcutaneous  contusion/hematoma in the left frontal scalp and left periorbital region. There is a small metallic density in the right anterior frontal scalp which may be an artifact or foreign body in the soft tissues. Limited intracranial: Unremarkable. IMPRESSION: No recent fracture is seen in facial bones. There are no air-fluid levels in paranasal sinuses.  Degenerative changes are noted in right TMJ. Electronically Signed   By: Elmer Picker M.D.   On: 10/03/2022 15:07   DG Elbow Complete Left  Result Date: 10/03/2022 CLINICAL DATA:  Trauma, fall EXAM: LEFT ELBOW - COMPLETE 3+ VIEW COMPARISON:  None Available. FINDINGS: No displaced fracture or dislocation is seen. There is no displacement of posterior fat pad. Osteopenia is seen in bony structures. IMPRESSION: No displaced fracture or dislocation is seen in left elbow. Electronically Signed   By: Elmer Picker M.D.   On: 10/03/2022 15:01   CT Head Wo Contrast  Result Date: 10/03/2022 CLINICAL DATA:  Trauma, fall EXAM: CT HEAD WITHOUT CONTRAST TECHNIQUE: Contiguous axial images were obtained from the base of the skull through the vertex without intravenous contrast. RADIATION DOSE REDUCTION: This exam was performed according to the departmental dose-optimization program which includes automated exposure control, adjustment of the mA and/or kV according to patient size and/or use of iterative reconstruction technique. COMPARISON:  01/12/2021 FINDINGS: Brain: No acute intracranial findings are seen. There are no signs of bleeding within the cranium. Cortical sulci are prominent. Vascular: Unremarkable. Skull: No fracture is seen in calvarium. There is subcutaneous hematoma in the right parietal scalp. Sinuses/Orbits: There are no air-fluid levels in paranasal sinuses. Other: None. IMPRESSION: No acute intracranial findings are seen in noncontrast CT brain. Atrophy. There is a subcutaneous hematoma in the right parietal scalp. No fracture is seen in calvarium.  Electronically Signed   By: Elmer Picker M.D.   On: 10/03/2022 15:00   DG Lumbar Spine Complete  Result Date: 10/03/2022 CLINICAL DATA:  Pain after fall EXAM: LUMBAR SPINE - COMPLETE 5 VIEW COMPARISON:  10/06/2019 FINDINGS: Osteopenia. Preserved vertebral body height. Slight dextroconvex curvature of the mid lumbar spine. Mild scattered endplate osteophytes. No listhesis. Lower lumbar facet degenerative changes. No spondylolysis. Five lumbar-type vertebral bodies. Surgical changes in the central pelvis at the edge of the imaging field. Recommend continue precautions until clinical clearance and if there is further concern additional workup with CT as clinically directed for further sensitivity. IMPRESSION: Osteopenia. Curvature of the spine with moderate degenerative changes Electronically Signed   By: Jill Side M.D.   On: 10/03/2022 13:16   DG Chest 2 View  Result Date: 10/03/2022 CLINICAL DATA:  Fall EXAM: CHEST - 2 VIEW COMPARISON:  None Available. FINDINGS: No pleural effusion. No pneumothorax. Right basilar atelectasis. No radiographically apparent displaced rib fracture. Normal cardiac and mediastinal contours. Visualized upper abdomen is unremarkable. Bilateral humeral fixation hardware. IMPRESSION: 1.  Right basilar atelectasis. 2.  No pneumothorax or radiographic evidence of traumatic injury. Electronically Signed   By: Marin Roberts M.D.   On: 10/03/2022 13:14    Procedures Procedures    Medications Ordered in ED Medications  LORazepam (ATIVAN) injection 0.5 mg (0.5 mg Intravenous Given 10/03/22 1659)  fentaNYL (SUBLIMAZE) injection 25 mcg (25 mcg Intravenous Given 10/03/22 1530)    ED Course/ Medical Decision Making/ A&P                              72 year old female with a history of nonischemic cardiomyopathy, chronic rhinitis, chronic pain syndrome, migraine, who presents with concern for disequilibrium and multiple falls.   Given her history of trauma, imaging was  obtained in the areas of pain.  CT head was completed and personally about interpreted by me showed no acute intracranial findings, subcutaneous hematoma right parietal scalp, no recent maxillofacial trauma, no  recent fracture of the cervical spine.  X-ray of the elbow shows no evidence of fracture.  X-ray of the lumbar spine with degenerative changes.  Her chest x-ray shows right basilar atelectasis without pneumothorax or other acute abnormality.  She does have a history of chronic back pain, and the back pain she has now is similar to prior.  Left elbow skin tear will heal by secondary intention.  Has some pain with movement of her right lower extremity, but has been able to weight-bear without significant pain and doubt fracture.   Differential diagnosis for her multiple falls and feeling of being off balance include arrhythmia, infection causing generalized weakness, anemia, electrolyte abnormalities, medication effect.  Labs are completed and personally about interpreted by me.  Urinalysis shows no evidence of infection.  COVID, influenza and RSV testing are negative.  CBC shows no anemia, no leukocytosis.  CMP without clinically significant electrolyte abnormalities.  Troponin within normal limits.  MRI brain ordered and pending given her disequilibrium and unsteady gait.        Final Clinical Impression(s) / ED Diagnoses Final diagnoses:  Fall, initial encounter  Disequilibrium    Rx / DC Orders ED Discharge Orders     None         Gareth Morgan, MD 10/03/22 1714

## 2022-10-07 ENCOUNTER — Encounter (HOSPITAL_COMMUNITY): Payer: Self-pay

## 2022-10-07 ENCOUNTER — Ambulatory Visit (HOSPITAL_COMMUNITY)
Admission: RE | Admit: 2022-10-07 | Payer: No Typology Code available for payment source | Source: Ambulatory Visit | Attending: Internal Medicine | Admitting: Internal Medicine

## 2022-10-10 DIAGNOSIS — R296 Repeated falls: Secondary | ICD-10-CM | POA: Diagnosis not present

## 2022-10-10 DIAGNOSIS — F330 Major depressive disorder, recurrent, mild: Secondary | ICD-10-CM | POA: Diagnosis not present

## 2022-10-10 DIAGNOSIS — M25521 Pain in right elbow: Secondary | ICD-10-CM | POA: Diagnosis not present

## 2022-10-10 DIAGNOSIS — Z9181 History of falling: Secondary | ICD-10-CM | POA: Diagnosis not present

## 2022-10-10 DIAGNOSIS — G4700 Insomnia, unspecified: Secondary | ICD-10-CM | POA: Diagnosis not present

## 2022-10-21 ENCOUNTER — Telehealth: Payer: Self-pay | Admitting: *Deleted

## 2022-10-21 DIAGNOSIS — E785 Hyperlipidemia, unspecified: Secondary | ICD-10-CM

## 2022-10-21 MED ORDER — ATORVASTATIN CALCIUM 80 MG PO TABS: 80.0000 mg | ORAL_TABLET | Freq: Every day | ORAL | 3 refills | Status: DC

## 2022-10-21 NOTE — Telephone Encounter (Signed)
-----   Message from Early Osmond, MD sent at 09/24/2022  8:34 AM EST ----- Lets increase atorvastatin to 80 mg and check a lipid panel and LFTs in 2 months.

## 2022-10-21 NOTE — Telephone Encounter (Signed)
I spoke w the patient.  She has increased the atorvastatin to 80 mg about 2 weeks ago.  New prescription for increased dose sent to pharmacy and appointment made to recheck lipids/liver in June.  Pt pleased w plan.

## 2022-10-24 ENCOUNTER — Ambulatory Visit (HOSPITAL_COMMUNITY)
Admission: RE | Admit: 2022-10-24 | Payer: No Typology Code available for payment source | Source: Ambulatory Visit | Attending: Internal Medicine | Admitting: Internal Medicine

## 2022-10-28 DIAGNOSIS — H00022 Hordeolum internum right lower eyelid: Secondary | ICD-10-CM | POA: Diagnosis not present

## 2022-10-28 DIAGNOSIS — H00025 Hordeolum internum left lower eyelid: Secondary | ICD-10-CM | POA: Diagnosis not present

## 2022-10-31 IMAGING — CR DG CHEST 1V
1 series · 1 of 1 positions shown · non-contrast
Comparison: 08/12/2020

CLINICAL DATA: Fall, chest pain

EXAM:
CHEST  1 VIEW

[chest ap]
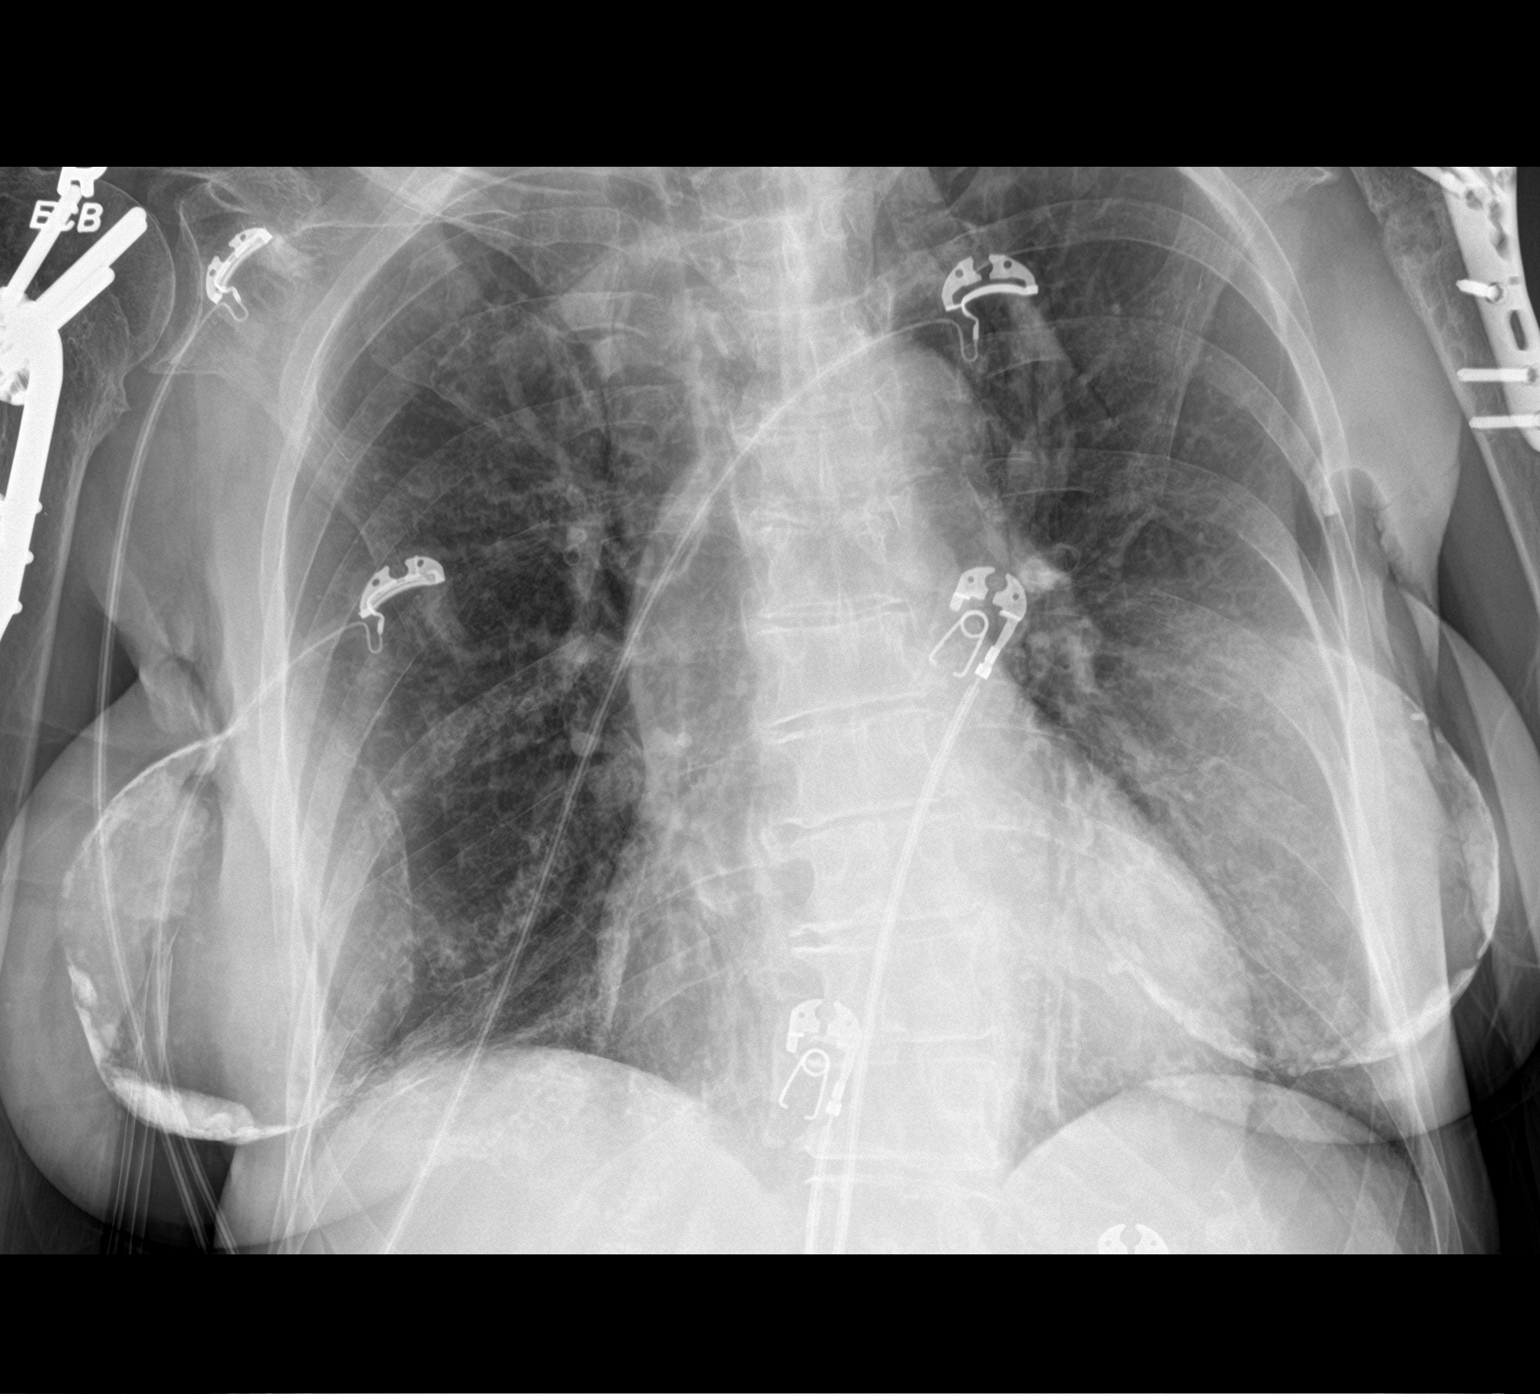

[1 of 1 positions shown; findings below may reference images not displayed]

FINDINGS: No new consolidation or edema. Stable chronic interstitial changes.
No pleural effusion or pneumothorax. Stable cardiomediastinal
contours. Breast implant calcifications bilaterally. Partially
imaged bilateral humeral fixation hardware.
IMPRESSION: No acute process in the chest.

## 2022-10-31 IMAGING — CR DG HIP (WITH OR WITHOUT PELVIS) 2-3V*L*
2 series · 2 of 2 positions shown · non-contrast
Comparison: Radiograph 04/19/2018

CLINICAL DATA: Bilateral hip pain.  Fall at home yesterday.

EXAM:
DG HIP (WITH OR WITHOUT PELVIS) 2-3V LEFT

[hip ap]
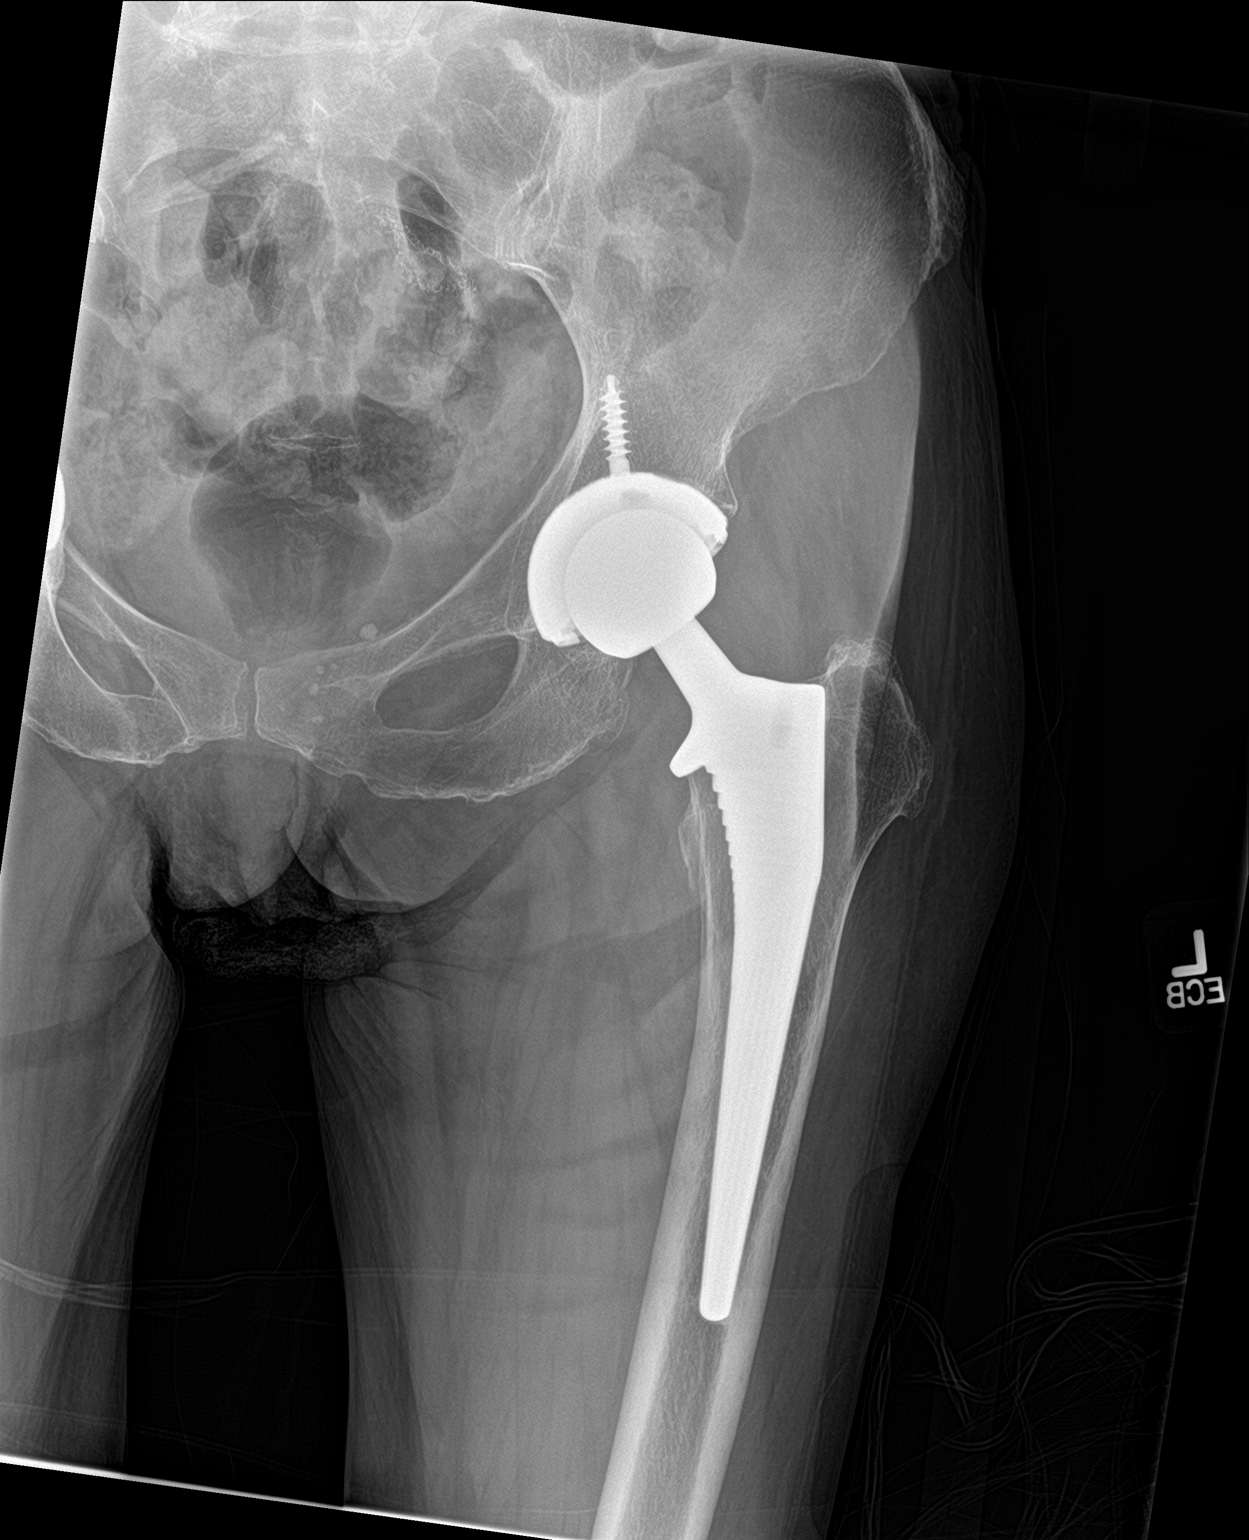

[hip lat]
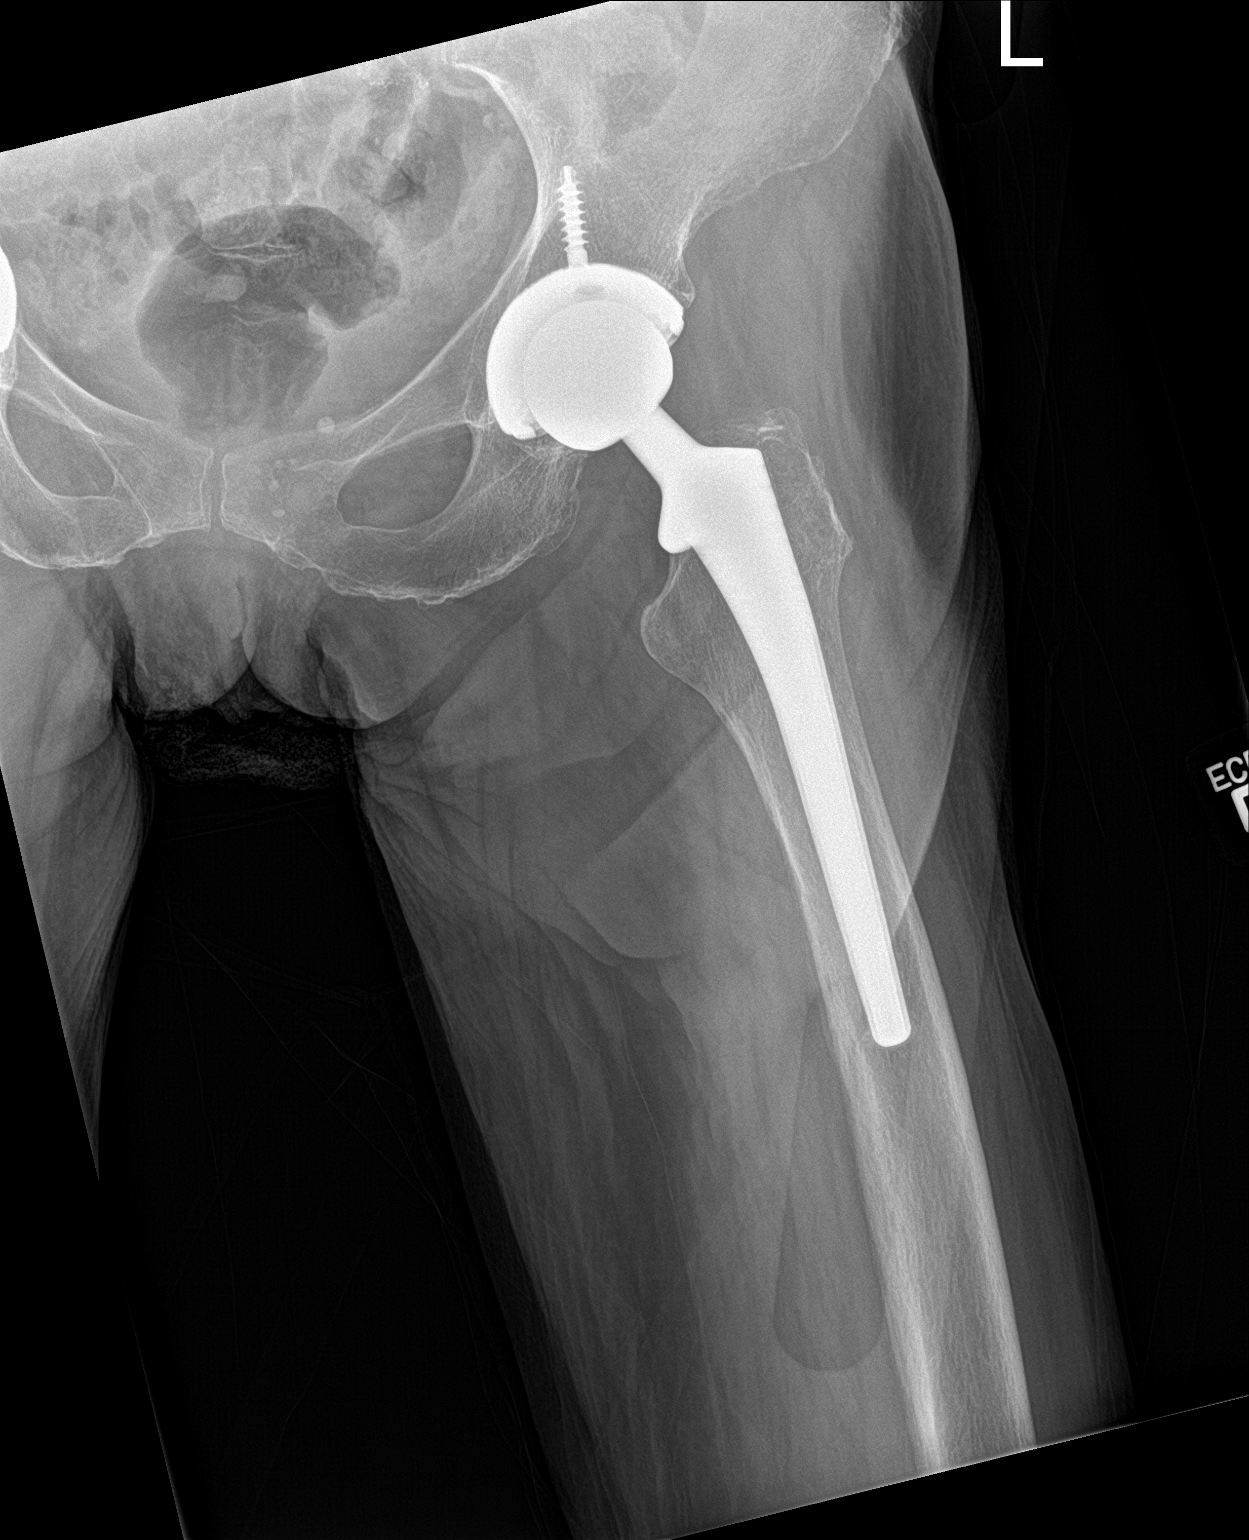

[2 of 2 positions shown; findings below may reference images not displayed]

FINDINGS: Left hip arthroplasty in expected alignment. There is no
periprosthetic lucency or fracture. The pubic rami are intact. No
fracture of the bony pelvis. Pubic symphysis and sacroiliac joints
are congruent.
IMPRESSION: Left hip arthroplasty without complication or acute fracture.

## 2022-10-31 IMAGING — CR DG HIP (WITH OR WITHOUT PELVIS) 2-3V*R*
2 series · 2 of 2 positions shown · non-contrast
Comparison: Postoperative radiograph 08/11/2020

CLINICAL DATA: Bilateral hip pain after fall at home yesterday.

EXAM:
DG HIP (WITH OR WITHOUT PELVIS) 2-3V RIGHT

[hip ap]
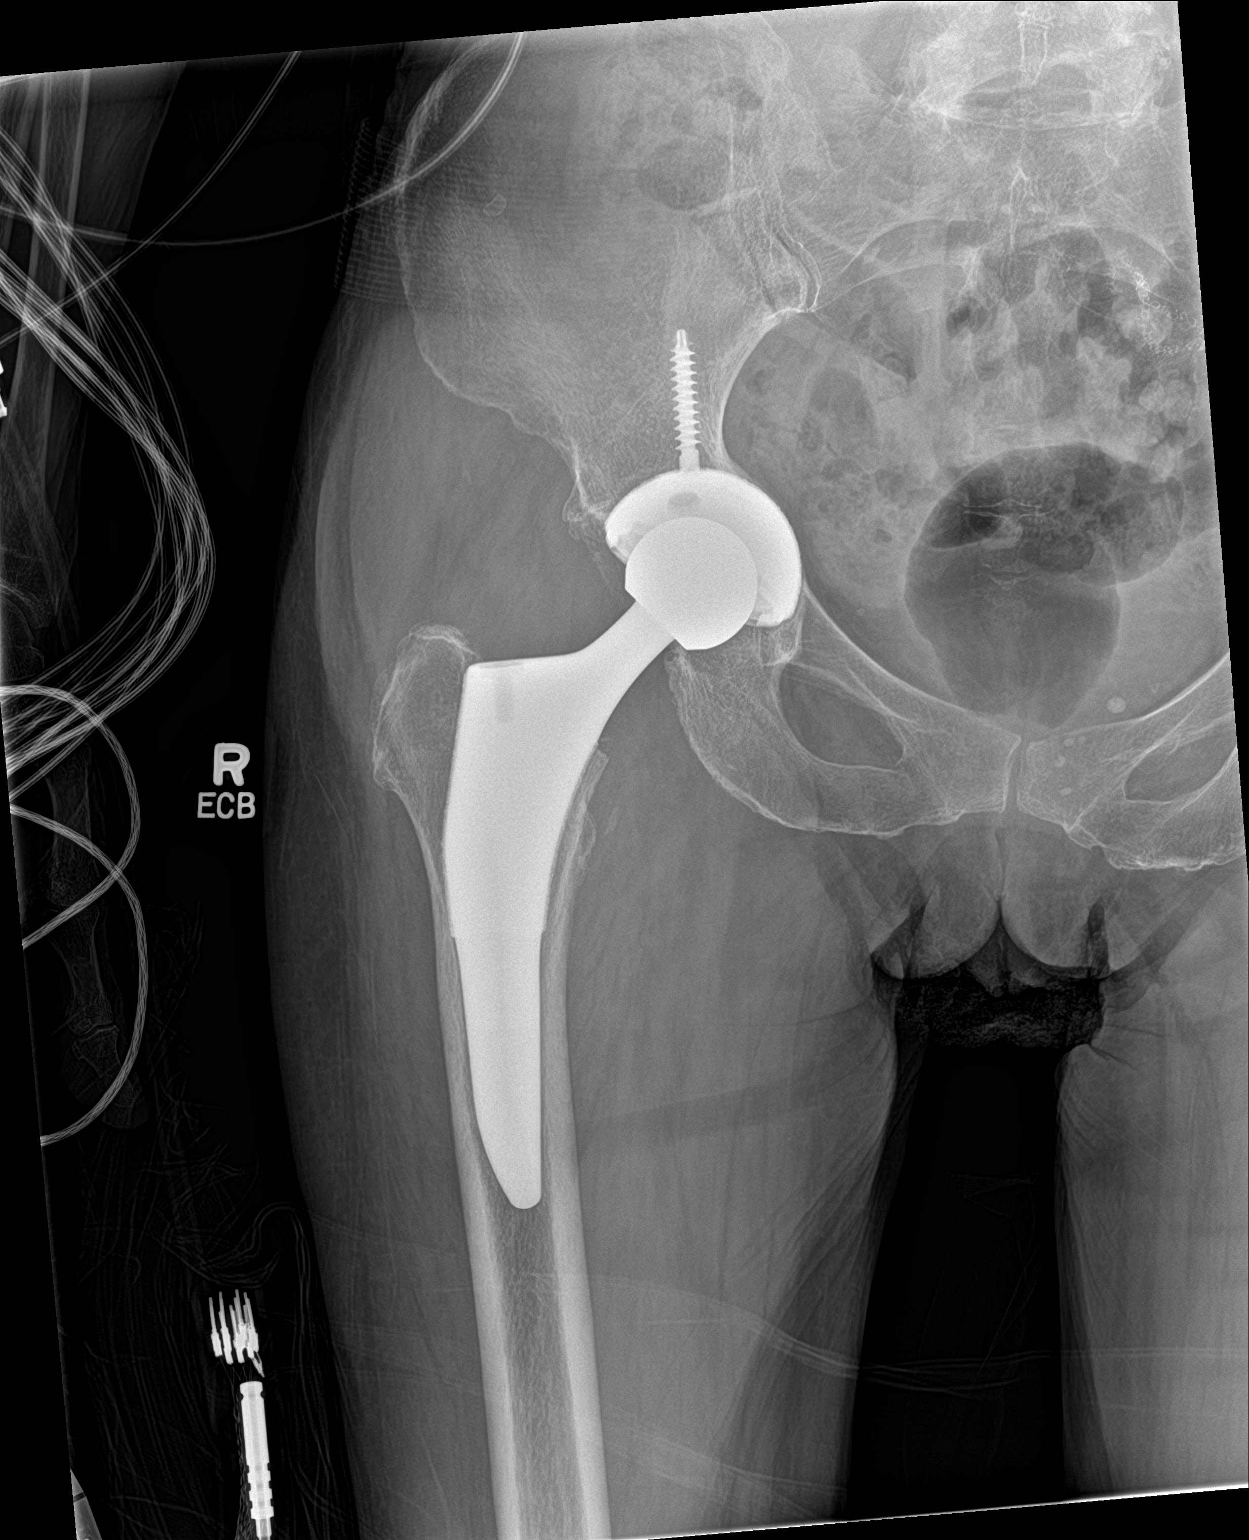

[hip lat]
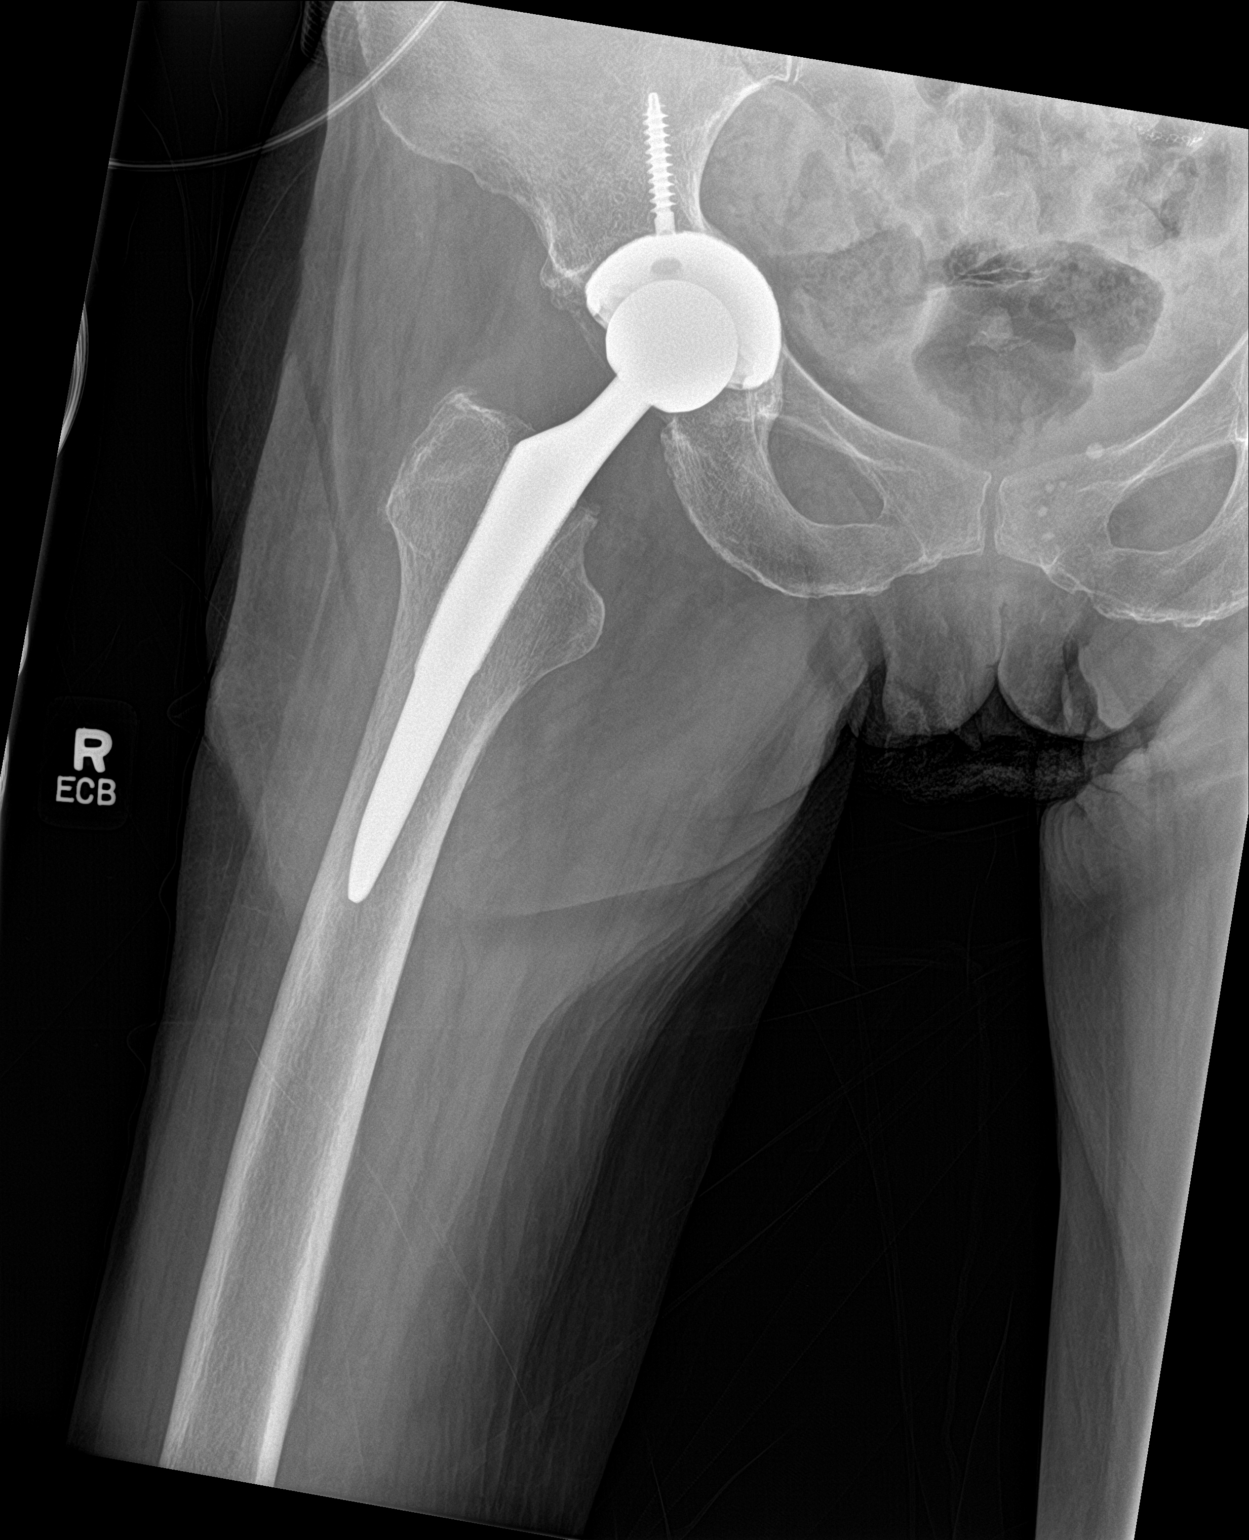

[2 of 2 positions shown; findings below may reference images not displayed]

FINDINGS: Right hip arthroplasty unchanged in alignment. There is no
periprosthetic lucency or fracture. Pubic rami are intact. No acute
fracture of the bony pelvis. Previous postsurgical soft tissue
changes have resolved.
IMPRESSION: Right hip arthroplasty without complication or acute fracture.

## 2022-10-31 IMAGING — CT CT HEAD W/O CM
3 series · 15 of 47 positions shown, 18 images · non-contrast
Comparison: 10/26/2018

CLINICAL DATA: Status post fall, head trauma

EXAM:
CT HEAD WITHOUT CONTRAST
TECHNIQUE: Contiguous axial images were obtained from the base of the skull
through the vertex without intravenous contrast.

[Series 3: head 5.0 h30s · axial · 0.39mm/px · z∈[-82,+43]mm · 9 of 30 slices shown, 12 images]
[im 3/30  brain]
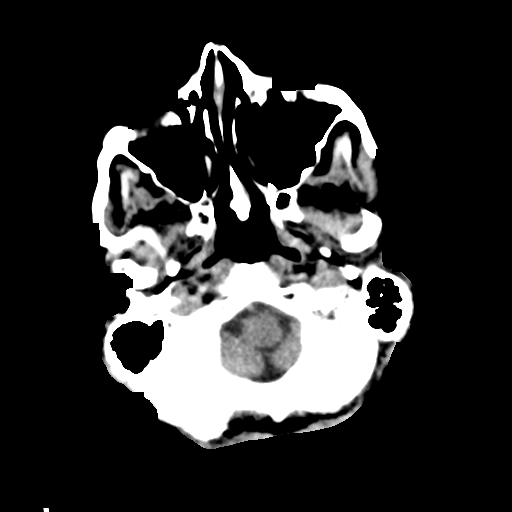
[im 3/30  bone]
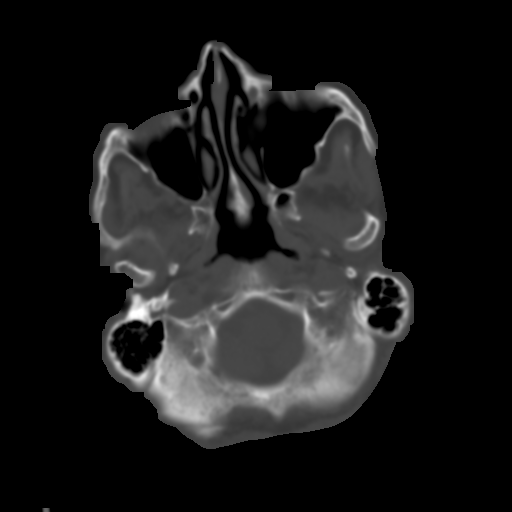
[im 6/30  brain]
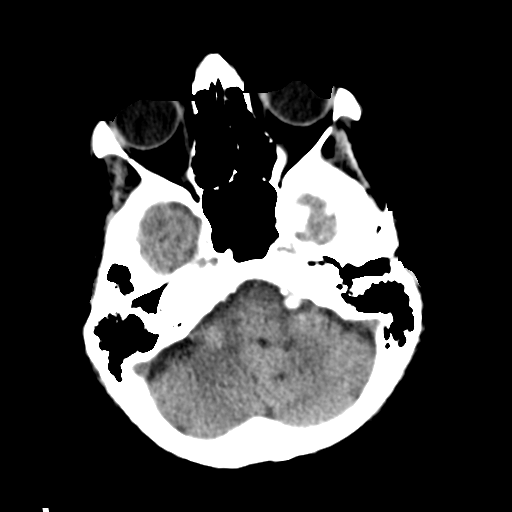
[im 9/30  brain]
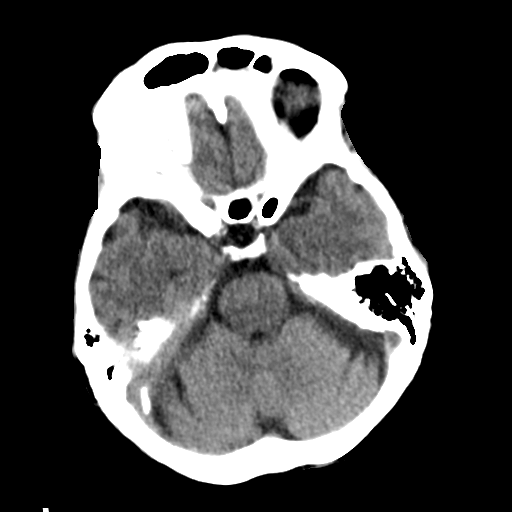
[im 12/30  brain]
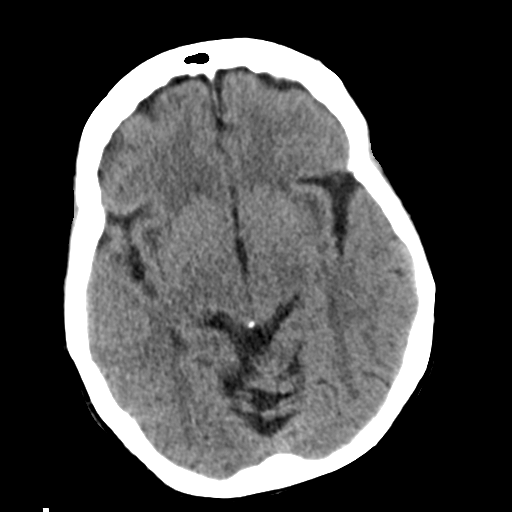
[im 16/30  brain]
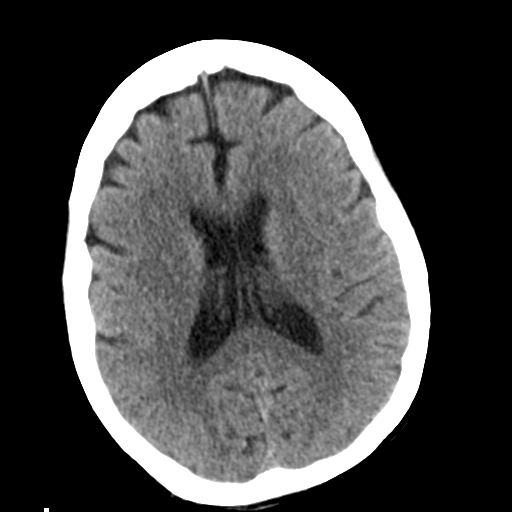
[im 16/30  bone]
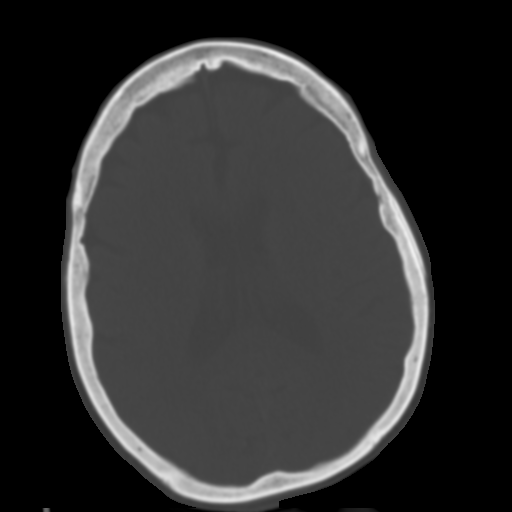
[im 19/30  brain]
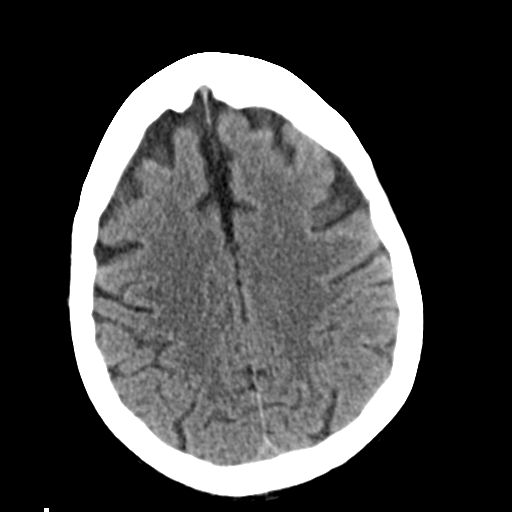
[im 22/30  brain]
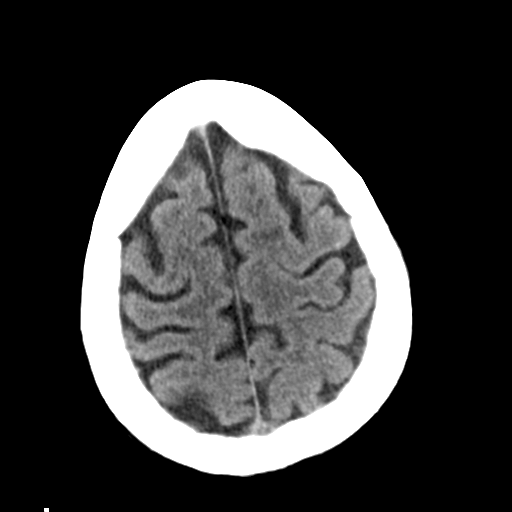
[im 25/30  brain]
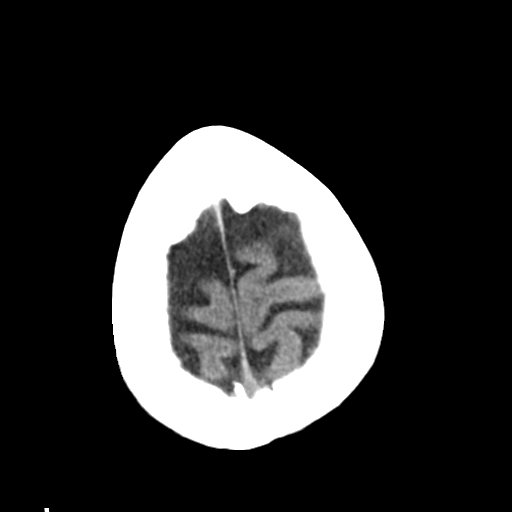
[im 28/30  brain]
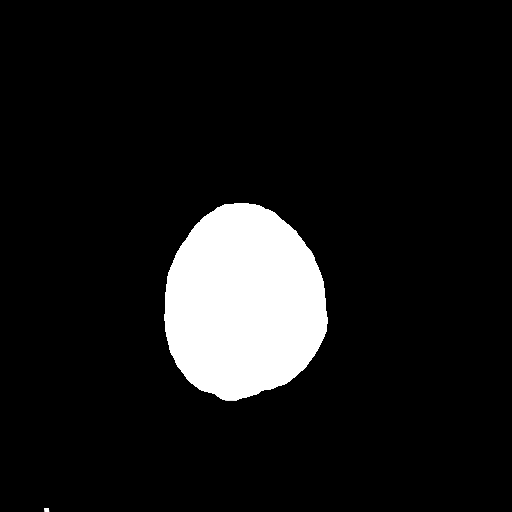
[im 28/30  bone]
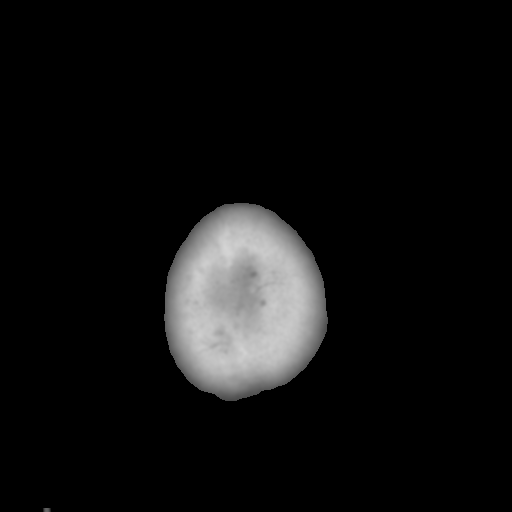

[Series 5: head 3.0 mpr cor · coronal · 0.31mm/px · 3 of 67 slices shown]
[im 23/67  brain]
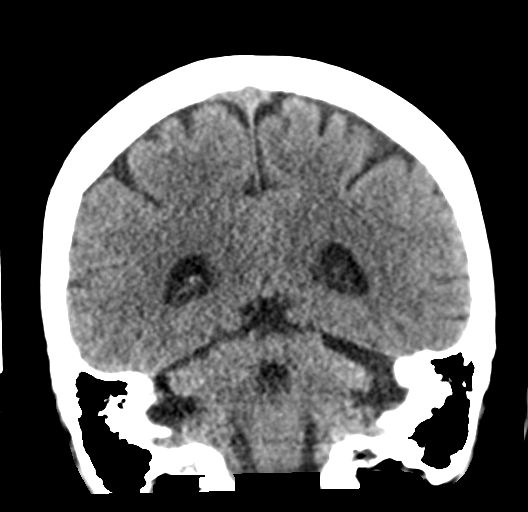
[im 30/67  brain]
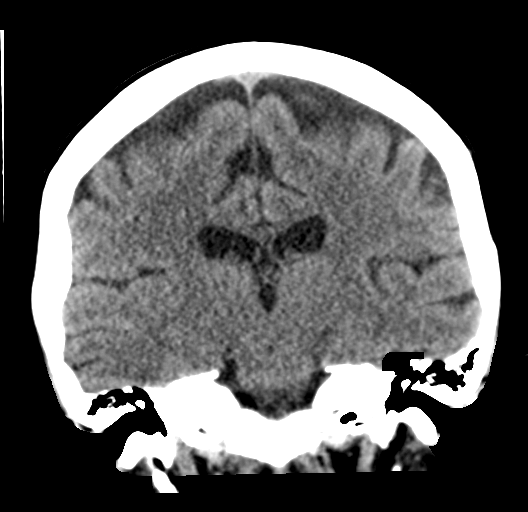
[im 37/67  brain]
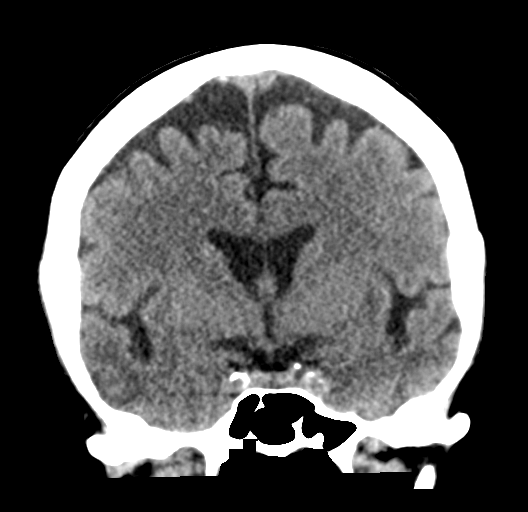

[Series 6: head 3.0 mpr sag · sagittal · 0.33mm/px · 3 of 66 slices shown]
[im 22/66  brain]
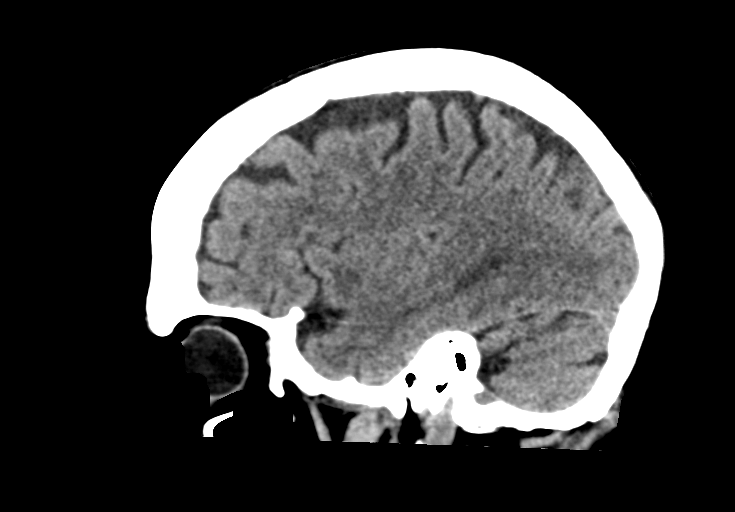
[im 33/66  brain]
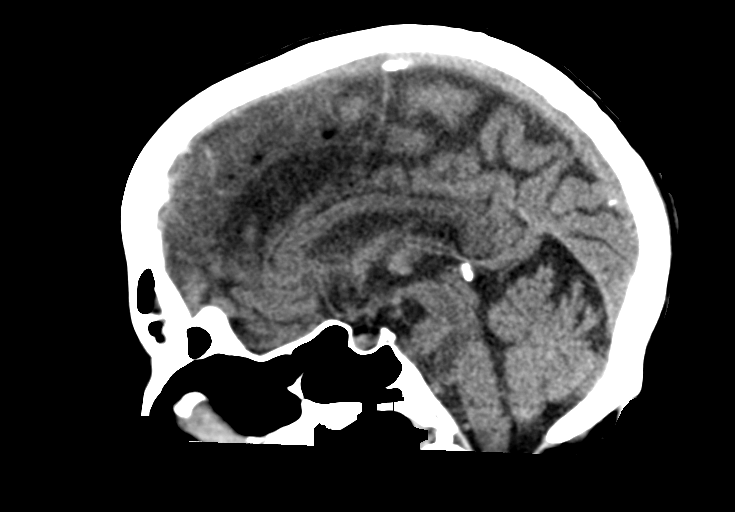
[im 44/66  brain]
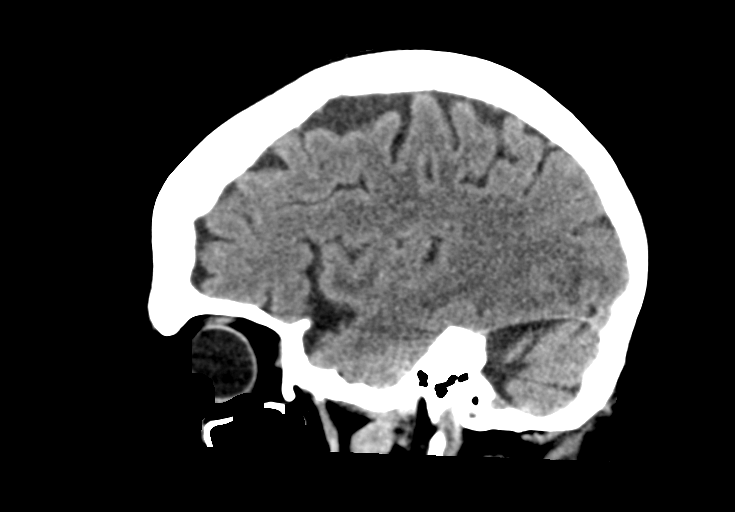

[15 of 47 positions shown; findings below may reference images not displayed]

FINDINGS: Brain: No evidence of acute infarction, hemorrhage, extra-axial
collection, ventriculomegaly, or mass effect. Generalized cerebral
atrophy. Periventricular white matter low attenuation likely
secondary to microangiopathy.

Vascular: Cerebrovascular atherosclerotic calcifications are noted.

Skull: Negative for fracture or focal lesion.

Sinuses/Orbits: Visualized portions of the orbits are unremarkable.
Left frontoethmoidal recess mucosal thickening. Visualized portions
of the mastoid air cells are unremarkable.

Other: None.
IMPRESSION: 1. No acute intracranial pathology.
2. Chronic microvascular disease and cerebral atrophy.

## 2022-11-03 DIAGNOSIS — H00025 Hordeolum internum left lower eyelid: Secondary | ICD-10-CM | POA: Diagnosis not present

## 2022-11-03 DIAGNOSIS — H00022 Hordeolum internum right lower eyelid: Secondary | ICD-10-CM | POA: Diagnosis not present

## 2022-11-04 ENCOUNTER — Encounter: Payer: Self-pay | Admitting: Internal Medicine

## 2022-11-04 DIAGNOSIS — E785 Hyperlipidemia, unspecified: Secondary | ICD-10-CM

## 2022-11-07 ENCOUNTER — Encounter: Payer: Self-pay | Admitting: Internal Medicine

## 2022-11-07 NOTE — Telephone Encounter (Signed)
Referral placed for PharmD visit for lipid management.  Pt reporting memory problems on atorvastatin.  Adv patient to stop atova for now to see if memory improves.

## 2022-11-17 ENCOUNTER — Telehealth: Payer: Self-pay | Admitting: Internal Medicine

## 2022-11-17 DIAGNOSIS — E785 Hyperlipidemia, unspecified: Secondary | ICD-10-CM

## 2022-11-17 NOTE — Telephone Encounter (Signed)
-----   Message from Orbie Pyo, MD sent at 11/06/2022  5:37 PM EDT ----- Regarding: statin alternatives Lets have her see pharmacy to discuss alternatives to statins

## 2022-11-17 NOTE — Telephone Encounter (Signed)
Referral placed to lipid clinic at this time.

## 2022-11-27 ENCOUNTER — Emergency Department (HOSPITAL_COMMUNITY)
Admission: EM | Admit: 2022-11-27 | Discharge: 2022-11-28 | Disposition: A | Discharge status: Home / Self Care | Payer: No Typology Code available for payment source | Attending: Emergency Medicine | Admitting: Emergency Medicine

## 2022-11-27 ENCOUNTER — Encounter (HOSPITAL_COMMUNITY): Payer: Self-pay

## 2022-11-27 ENCOUNTER — Other Ambulatory Visit: Payer: Self-pay

## 2022-11-27 DIAGNOSIS — S01111A Laceration without foreign body of right eyelid and periocular area, initial encounter: Secondary | ICD-10-CM | POA: Insufficient documentation

## 2022-11-27 DIAGNOSIS — S0181XA Laceration without foreign body of other part of head, initial encounter: Secondary | ICD-10-CM

## 2022-11-27 DIAGNOSIS — W01190A Fall on same level from slipping, tripping and stumbling with subsequent striking against furniture, initial encounter: Secondary | ICD-10-CM | POA: Diagnosis not present

## 2022-11-27 DIAGNOSIS — S0240CA Maxillary fracture, right side, initial encounter for closed fracture: Secondary | ICD-10-CM | POA: Insufficient documentation

## 2022-11-27 DIAGNOSIS — Z043 Encounter for examination and observation following other accident: Secondary | ICD-10-CM | POA: Diagnosis not present

## 2022-11-27 DIAGNOSIS — W19XXXA Unspecified fall, initial encounter: Secondary | ICD-10-CM

## 2022-11-27 DIAGNOSIS — S60221A Contusion of right hand, initial encounter: Secondary | ICD-10-CM | POA: Diagnosis not present

## 2022-11-27 DIAGNOSIS — R936 Abnormal findings on diagnostic imaging of limbs: Secondary | ICD-10-CM | POA: Diagnosis not present

## 2022-11-27 DIAGNOSIS — R519 Headache, unspecified: Secondary | ICD-10-CM | POA: Diagnosis present

## 2022-11-27 NOTE — ED Notes (Signed)
2345, light green, dark green, lavender and blue top sent to lab.

## 2022-11-27 NOTE — ED Triage Notes (Signed)
Patient reports she fell tonight, thinks she tripped over her own feet. Has large hematoma on right side of face near eye. Takes baby aspirin. Endorses dizziness currently but not when she fell. Takes baby aspirin daily.

## 2022-11-28 ENCOUNTER — Emergency Department (HOSPITAL_COMMUNITY): Payer: No Typology Code available for payment source

## 2022-11-28 DIAGNOSIS — S02601D Fracture of unspecified part of body of right mandible, subsequent encounter for fracture with routine healing: Secondary | ICD-10-CM | POA: Diagnosis not present

## 2022-11-28 DIAGNOSIS — Z043 Encounter for examination and observation following other accident: Secondary | ICD-10-CM | POA: Diagnosis not present

## 2022-11-28 DIAGNOSIS — S0240CA Maxillary fracture, right side, initial encounter for closed fracture: Secondary | ICD-10-CM | POA: Diagnosis not present

## 2022-11-28 DIAGNOSIS — S0181XD Laceration without foreign body of other part of head, subsequent encounter: Secondary | ICD-10-CM | POA: Diagnosis not present

## 2022-11-28 DIAGNOSIS — W19XXXA Unspecified fall, initial encounter: Secondary | ICD-10-CM | POA: Diagnosis not present

## 2022-11-28 LAB — CBG MONITORING, ED: Glucose-Capillary: 118 mg/dL — ABNORMAL HIGH (ref 70–99)

## 2022-11-28 MED ORDER — BUPIVACAINE HCL (PF) 0.5 % IJ SOLN
10.0000 mL | Freq: Once | INTRAMUSCULAR | Status: AC
  Administered 2022-11-28: 10 mL
  Filled 2022-11-28: qty 30

## 2022-11-28 MED ORDER — BACITRACIN ZINC 500 UNIT/GM EX OINT
TOPICAL_OINTMENT | Freq: Two times a day (BID) | CUTANEOUS | Status: DC
  Filled 2022-11-28: qty 0.9

## 2022-11-28 MED ORDER — AMOXICILLIN-POT CLAVULANATE 875-125 MG PO TABS: 1.0000 | ORAL_TABLET | Freq: Two times a day (BID) | ORAL | 0 refills | Status: DC

## 2022-11-28 MED ORDER — ACETAMINOPHEN 325 MG PO TABS
650.0000 mg | ORAL_TABLET | Freq: Once | ORAL | Status: AC
  Administered 2022-11-28: 650 mg via ORAL
  Filled 2022-11-28: qty 2

## 2022-11-28 MED ORDER — OXYCODONE-ACETAMINOPHEN 5-325 MG PO TABS
1.0000 | ORAL_TABLET | Freq: Once | ORAL | Status: AC
  Administered 2022-11-28: 1 via ORAL
  Filled 2022-11-28: qty 1

## 2022-11-28 MED ORDER — FLUTICASONE PROPIONATE 50 MCG/ACT NA SUSP: 2.0000 | Freq: Every day | NASAL | 0 refills | Status: AC

## 2022-11-28 NOTE — Discharge Instructions (Addendum)
You have a fracture of the of your sinus which is in your cheek area just below your.  Try to avoid blowing your nose, use of fluticasone as prescribed, take the antibiotics for the entire course.  Tylenol and ibuprofen as discussed below.  There is a possibility of a fracture that is nondisplaced therefore splinting it is primary treatment at this time.  I given you the information for a hand surgeon office to follow-up with.  Call make an appointment I recommend scheduling a follow-up appointment in 7 to 10 days  Please use Tylenol or ibuprofen for pain.  You may use 600 mg ibuprofen every 6 hours or 1000 mg of Tylenol every 6 hours.  You may choose to alternate between the 2.  This would be most effective.  Not to exceed 4 g of Tylenol within 24 hours.  Not to exceed 3200 mg ibuprofen 24 hours.   DO NOT blow your nose for at least two weeks.  If you must, blow without occluding your nostrils.   DO NOT forcibly spit for one week.  DO NOT smoke or use smokeless tobacco; smoking greatly inhibits the healing process, especially in the sinuses.  Sneeze with your MOUTH OPEN. If the urge to sneeze arises, do not sneeze through your nose and avoid pinching nostrils.  Drink without a straw for one week.  Avoid swimming for one month and strenuous exercise (e.g. heavy lifting) for one week.  Gentle swishing with salt water may be done for one week, but do not rinse vigorously.  Please follow up with ENT in the next 2-3 days

## 2022-11-28 NOTE — ED Provider Notes (Signed)
[place] EMERGENCY DEPARTMENT AT [facility] Provider Note   CSN: [id number] Arrival date & time: 11/27/22  2307     History  Chief Complaint  Patient presents with   Marletta Lor    Dorothy White is a 72 y.o. female.   Fall  Patient is a 72 year old female with a past medical history significant for chronic back pain, depression, reflux, anemia, stroke, head injuries  Patient presents emergency room today after fall that occurred at home.  She states that she tripped over her feet and fell forward striking her head against the concrete of her driveway.  She did not lose consciousness she denies any nausea or vomiting.  She denies any pain other than in her head and face.  She states the right-sided face is most painful.  She takes aspirin but no other blood thinners or anticoagulation     Home Medications Prior to Admission medications   Medication Sig Start Date End Date Taking? Authorizing Provider  amoxicillin-clavulanate (AUGMENTIN) 875-125 MG tablet Take 1 tablet by mouth every 12 (twelve) hours. 11/28/22  Yes Lavontay Kirk S, PA  fluticasone (FLONASE) 50 MCG/ACT nasal spray Place 2 sprays into both nostrils daily for 14 days. 11/28/22 12/12/22 Yes Lucah Petta, Stevphen Meuse S, PA  acetaminophen (TYLENOL) 500 MG tablet Take 1,000 mg by mouth every 8 (eight) hours as needed for headache (pain).    [provider]  aspirin EC 81 MG tablet Take 1 tablet (81 mg total) by mouth daily. Swallow whole. 12/16/21   Orbie Pyo, MD  atorvastatin (LIPITOR) 80 MG tablet Take 1 tablet (80 mg total) by mouth daily. 10/21/22   Orbie Pyo, MD  Cholecalciferol (VITAMIN D3) 50 MCG (2000 UT) capsule Take 2,000 Units by mouth every morning. 1 capsule 05/07/21   [provider]  docusate sodium (COLACE) 100 MG capsule Take 1 capsule (100 mg total) by mouth daily as needed for mild constipation. 01/23/22   Glade Lloyd, MD  ferrous sulfate 325 (65 FE) MG tablet Take 1 tablet  (325 mg total) by mouth daily with breakfast. 01/24/22   Glade Lloyd, MD  FLUoxetine (PROZAC) 40 MG capsule Take 80 mg by mouth every morning. 04/18/18   [provider]  furosemide (LASIX) 20 MG tablet Take 20 mg by mouth daily as needed for fluid or edema (ankle swelling). 10/25/21   [provider]  gabapentin (NEURONTIN) 100 MG capsule Take 200 mg by mouth 3 (three) times daily as needed (nerve pain).    [provider]  metoprolol tartrate (LOPRESSOR) 100 MG tablet Take one tablet by mouth 2 hours before CT scan 02/05/22   Orbie Pyo, MD  mirtazapine (REMERON) 15 MG tablet TAKE 1 TABLET BY MOUTH EVERYDAY AT BEDTIME 10/28/21   Tat, Octaviano Batty, DO  omeprazole (PRILOSEC) 20 MG capsule Take 20 mg by mouth every morning.    [provider]  ondansetron (ZOFRAN) 4 MG tablet Take 4 mg by mouth daily as needed for nausea or vomiting. 11/15/21   [provider]  oxyCODONE-acetaminophen (PERCOCET) 10-325 MG tablet Take 1 tablet by mouth every 4 (four) hours as needed for pain. 01/08/21   [provider]  propranolol (INDERAL) 10 MG tablet Take 1 tablet (10 mg total) by mouth 2 (two) times daily. 04/22/22   Rancour, Jeannett Senior, MD  SUMAtriptan (IMITREX) 100 MG tablet Take 50-100 mg by mouth daily as needed for migraine. 12/24/18   [provider]  tiZANidine (ZANAFLEX) 4 MG tablet  Take 4 mg by mouth 3 (three) times daily as needed for muscle spasms. 04/10/22   [provider]  traZODone (DESYREL) 100 MG tablet Take 100 mg by mouth at bedtime. 09/25/21   [provider]  vitamin B-12 (CYANOCOBALAMIN) 1000 MCG tablet Take 1,000 mcg by mouth daily. 1 tablet 05/07/21   [provider]      Allergies    Opana [oxymorphone hcl]    Review of Systems   Review of Systems  Physical Exam Updated Vital Signs BP (!) 147/70   Pulse 84   Temp 98.9 F (37.2 C)   Resp 18   Ht 5\' 5"  (1.651 m)   Wt 63.5 kg   SpO2 96%   BMI 23.30  kg/m  Physical Exam Vitals and nursing note reviewed.  Constitutional:      General: She is not in acute distress. HENT:     Head: Normocephalic and atraumatic.     Nose: Nose normal.  Eyes:     General: No scleral icterus. Cardiovascular:     Rate and Rhythm: Normal rate and regular rhythm.     Pulses: Normal pulses.     Heart sounds: Normal heart sounds.  Pulmonary:     Effort: Pulmonary effort is normal. No respiratory distress.     Breath sounds: No wheezing.  Abdominal:     Palpations: Abdomen is soft.     Tenderness: There is no abdominal tenderness.  Musculoskeletal:     Cervical back: Normal range of motion.     Right lower leg: No edema.     Left lower leg: No edema.     Comments: TTP of R 5th metacarpal, some bruising.  No tenderness, no C, T, L-spine tenderness, no chest wall tenderness abdominal tenderness or extremity TTP.    Skin:    General: Skin is warm and dry.     Capillary Refill: Capillary refill takes less than 2 seconds.     Comments: Stellate laceration to right eyebrow  Neurological:     Mental Status: She is alert. Mental status is at baseline.  Psychiatric:        Mood and Affect: Mood normal.        Behavior: Behavior normal.     ED Results / Procedures / Treatments   Labs (all labs ordered are listed, but only abnormal results are displayed) Labs Reviewed  CBG MONITORING, ED - Abnormal; Notable for the following components:      Result Value   Glucose-Capillary 118 (*)    All other components within normal limits    EKG None  Radiology DG Hand Complete Right  Result Date: 11/28/2022 CLINICAL DATA:  Fall EXAM: RIGHT HAND - COMPLETE 3+ VIEW COMPARISON:  None Available. FINDINGS: Suspect nondisplaced fracture at the base of the right fifth metacarpal. No dislocation. IMPRESSION: Suspect nondisplaced fracture at the base of the right fifth metacarpal. Correlate with point tenderness. Electronically Signed   By: Deatra Robinson M.D.   On:  11/28/2022 03:59   CT HEAD WO CONTRAST ( )  Result Date: 11/28/2022 CLINICAL DATA:  Fall EXAM: CT HEAD WITHOUT CONTRAST CT MAXILLOFACIAL WITHOUT CONTRAST CT CERVICAL SPINE WITHOUT CONTRAST TECHNIQUE: Multidetector CT imaging of the head, cervical spine, and maxillofacial structures were performed using the standard protocol without intravenous contrast. Multiplanar CT image reconstructions of the cervical spine and maxillofacial structures were also generated. RADIATION DOSE REDUCTION: This exam was performed according to the departmental dose-optimization program which includes automated exposure control, adjustment of  the mA and/or kV according to patient size and/or use of iterative reconstruction technique. COMPARISON:  None Available. FINDINGS: CT HEAD FINDINGS Brain: There is no mass, hemorrhage or extra-axial collection. The size and configuration of the ventricles and extra-axial CSF spaces are normal. The brain parenchyma is normal, without evidence of acute or chronic infarction. Vascular: No abnormal hyperdensity of the major intracranial arteries or dural venous sinuses. No intracranial atherosclerosis. Skull: The visualized skull base, calvarium and extracranial soft tissues are normal. CT MAXILLOFACIAL FINDINGS Osseous: Fractures of the right maxillary sinus superior, anterior and lateral walls. Orbits: Right orbital floor fracture with small amount of extraconal soft tissue gas. Orbits are otherwise unremarkable. Sinuses: Right maxillary hemosinus. Soft tissues: Right periorbital hematoma CT CERVICAL SPINE FINDINGS Alignment: No static subluxation. Facets are aligned. Occipital condyles and the lateral masses of C1-C2 are aligned. Skull base and vertebrae: No acute fracture. Soft tissues and spinal canal: No prevertebral fluid or swelling. No visible canal hematoma. Disc levels: No advanced spinal canal or neural foraminal stenosis. Upper chest: No pneumothorax, pulmonary nodule or pleural  effusion. Other: Normal visualized paraspinal cervical soft tissues. IMPRESSION: 1. No acute intracranial abnormality. 2. Fractures of the right maxillary sinus superior, anterior and lateral walls. 3. Small amount of inferior extraconal soft tissue gas in the right orbit with large periorbital hematoma that no abnormality of the major intraorbital soft tissue structures. 4. No acute fracture or static subluxation of the cervical spine. Electronically Signed   By: Deatra Robinson M.D.   On: 11/28/2022 01:50   CT Cervical Spine Wo Contrast  Result Date: 11/28/2022 CLINICAL DATA:  Fall EXAM: CT HEAD WITHOUT CONTRAST CT MAXILLOFACIAL WITHOUT CONTRAST CT CERVICAL SPINE WITHOUT CONTRAST TECHNIQUE: Multidetector CT imaging of the head, cervical spine, and maxillofacial structures were performed using the standard protocol without intravenous contrast. Multiplanar CT image reconstructions of the cervical spine and maxillofacial structures were also generated. RADIATION DOSE REDUCTION: This exam was performed according to the departmental dose-optimization program which includes automated exposure control, adjustment of the mA and/or kV according to patient size and/or use of iterative reconstruction technique. COMPARISON:  None Available. FINDINGS: CT HEAD FINDINGS Brain: There is no mass, hemorrhage or extra-axial collection. The size and configuration of the ventricles and extra-axial CSF spaces are normal. The brain parenchyma is normal, without evidence of acute or chronic infarction. Vascular: No abnormal hyperdensity of the major intracranial arteries or dural venous sinuses. No intracranial atherosclerosis. Skull: The visualized skull base, calvarium and extracranial soft tissues are normal. CT MAXILLOFACIAL FINDINGS Osseous: Fractures of the right maxillary sinus superior, anterior and lateral walls. Orbits: Right orbital floor fracture with small amount of extraconal soft tissue gas. Orbits are otherwise  unremarkable. Sinuses: Right maxillary hemosinus. Soft tissues: Right periorbital hematoma CT CERVICAL SPINE FINDINGS Alignment: No static subluxation. Facets are aligned. Occipital condyles and the lateral masses of C1-C2 are aligned. Skull base and vertebrae: No acute fracture. Soft tissues and spinal canal: No prevertebral fluid or swelling. No visible canal hematoma. Disc levels: No advanced spinal canal or neural foraminal stenosis. Upper chest: No pneumothorax, pulmonary nodule or pleural effusion. Other: Normal visualized paraspinal cervical soft tissues. IMPRESSION: 1. No acute intracranial abnormality. 2. Fractures of the right maxillary sinus superior, anterior and lateral walls. 3. Small amount of inferior extraconal soft tissue gas in the right orbit with large periorbital hematoma that no abnormality of the major intraorbital soft tissue structures. 4. No acute fracture or static subluxation of the cervical spine. Electronically Signed  By: Deatra Robinson M.D.   On: 11/28/2022 01:50   CT Maxillofacial Wo Contrast  Result Date: 11/28/2022 CLINICAL DATA:  Fall EXAM: CT HEAD WITHOUT CONTRAST CT MAXILLOFACIAL WITHOUT CONTRAST CT CERVICAL SPINE WITHOUT CONTRAST TECHNIQUE: Multidetector CT imaging of the head, cervical spine, and maxillofacial structures were performed using the standard protocol without intravenous contrast. Multiplanar CT image reconstructions of the cervical spine and maxillofacial structures were also generated. RADIATION DOSE REDUCTION: This exam was performed according to the departmental dose-optimization program which includes automated exposure control, adjustment of the mA and/or kV according to patient size and/or use of iterative reconstruction technique. COMPARISON:  None Available. FINDINGS: CT HEAD FINDINGS Brain: There is no mass, hemorrhage or extra-axial collection. The size and configuration of the ventricles and extra-axial CSF spaces are normal. The brain parenchyma is  normal, without evidence of acute or chronic infarction. Vascular: No abnormal hyperdensity of the major intracranial arteries or dural venous sinuses. No intracranial atherosclerosis. Skull: The visualized skull base, calvarium and extracranial soft tissues are normal. CT MAXILLOFACIAL FINDINGS Osseous: Fractures of the right maxillary sinus superior, anterior and lateral walls. Orbits: Right orbital floor fracture with small amount of extraconal soft tissue gas. Orbits are otherwise unremarkable. Sinuses: Right maxillary hemosinus. Soft tissues: Right periorbital hematoma CT CERVICAL SPINE FINDINGS Alignment: No static subluxation. Facets are aligned. Occipital condyles and the lateral masses of C1-C2 are aligned. Skull base and vertebrae: No acute fracture. Soft tissues and spinal canal: No prevertebral fluid or swelling. No visible canal hematoma. Disc levels: No advanced spinal canal or neural foraminal stenosis. Upper chest: No pneumothorax, pulmonary nodule or pleural effusion. Other: Normal visualized paraspinal cervical soft tissues. IMPRESSION: 1. No acute intracranial abnormality. 2. Fractures of the right maxillary sinus superior, anterior and lateral walls. 3. Small amount of inferior extraconal soft tissue gas in the right orbit with large periorbital hematoma that no abnormality of the major intraorbital soft tissue structures. 4. No acute fracture or static subluxation of the cervical spine. Electronically Signed   By: Deatra Robinson M.D.   On: 11/28/2022 01:50    Procedures .Marland KitchenLaceration Repair  Date/Time: 11/28/2022 5:20 AM  Performed by: Gailen Shelter, PA Authorized by: Gailen Shelter, PA   Consent:    Consent obtained:  Verbal   Consent given by:  Parent and patient   Risks discussed:  Infection, pain, poor cosmetic result and poor wound healing Universal protocol:    Procedure explained and questions answered to patient or proxy's satisfaction: yes     Relevant documents present  and verified: yes     Test results available: yes     Imaging studies available: yes     Required blood products, implants, devices, and special equipment available: yes     Site/side marked: yes     Immediately prior to procedure, a time out was called: yes     Patient identity confirmed:  Verbally with patient and arm band Laceration details:    Location:  Face   Face location:  R eyebrow   Length (cm):  2.8 Exploration:    Hemostasis achieved with:  Epinephrine   Imaging obtained comment:  CT   Imaging outcome: foreign body not noted     Wound extent: areolar tissue violated     Wound extent: fascia not violated, no foreign body and no signs of injury     Contaminated: no   Treatment:    Amount of cleaning:  Standard   Irrigation solution:  Sterile  saline   Irrigation volume:  Copious   Visualized foreign bodies/material removed: no   Skin repair:    Repair method:  Sutures   Suture size:  5-0   Suture material:  Plain gut   Suture technique:  Simple interrupted (Corner stitch, siimple interrupted)   Number of sutures:  2 Approximation:    Approximation:  Close Repair type:    Repair type:  Intermediate Post-procedure details:    Dressing:  Antibiotic ointment   Procedure completion:  Tolerated     Medications Ordered in ED Medications  bacitracin ointment ( Topical Given 11/28/22 0319)  oxyCODONE-acetaminophen (PERCOCET/ROXICET) 5-325 MG per tablet 1 tablet (1 tablet Oral Given 11/28/22 0033)  acetaminophen (TYLENOL) tablet 650 mg (650 mg Oral Given 11/28/22 0033)  bupivacaine(PF) (MARCAINE) 0.5 % injection 10 mL (10 mLs Infiltration Given by Other 11/28/22 0300)    ED Course/ Medical Decision Making/ A&P Clinical Course as of 11/28/22 0500  Fri Nov 28, 2022  1610 IMPRESSION: 1. No acute intracranial abnormality. 2. Fractures of the right maxillary sinus superior, anterior and lateral walls. 3. Small amount of inferior extraconal soft tissue gas in the right orbit  with large periorbital hematoma that no abnormality of the major intraorbital soft tissue structures. 4. No acute fracture or static subluxation of the cervical spine.   [WF]    Clinical Course User Index [WF] Gailen Shelter, PA                             Medical Decision Making Amount and/or Complexity of Data Reviewed Radiology: ordered.  Risk OTC drugs. Prescription drug management.   This patient presents to the ED for concern of fall, this involves a number of treatment options, and is a complaint that carries with it a moderate risk of complications and morbidity.   Co morbidities: Discussed in HPI   Brief History:  Patient is a 72 year old female with a past medical history significant for chronic back pain, depression, reflux, anemia, stroke, head injuries  Patient presents emergency room today after fall that occurred at home.  She states that she tripped over her feet and fell forward striking her head against the concrete of her driveway.  She did not lose consciousness she denies any nausea or vomiting.  She denies any pain other than in her head and face.  She states the right-sided face is most painful.  She takes aspirin but no other blood thinners or anticoagulation    EMR reviewed including pt PMHx, past surgical history and past visits to ER.   See HPI for more details   Lab Tests:   I ordered and independently interpreted labs. Labs notable for CBG nml  Imaging Studies:  Abnormal findings. I personally reviewed all imaging studies. Imaging notable for CT    Cardiac Monitoring:  The patient was maintained on a cardiac monitor.  I personally viewed and interpreted the cardiac monitored which showed an underlying rhythm of: NSR normal rate NA   Medicines ordered:  I ordered medication including tylenol, percocet for pain, wound care Reevaluation of the patient after these medicines showed that the patient improved I have reviewed the  patients home medicines and have made adjustments as needed   Critical Interventions:     Consults/Attending Physician   I discussed this case with my attending physician who cosigned this note including patient's presenting symptoms, physical exam, and planned diagnostics and interventions. Attending physician stated agreement  with plan or made changes to plan which were implemented.   Reevaluation:  After the interventions noted above I re-evaluated patient and found that they have :stayed the same   Social Determinants of Health:      Problem List / ED Course:  Mechanical fall. Laceration to face and bruising to R hand. Suture repair of face was completed.  Facial laceration was repaired with corner stitch and simple interrupted stitches.  Patient tolerated procedure well. Bruising to right hand with no fracture on x-ray but some question of cortical irregularity. Distally neurovascular intact.  Neurologically intact. Ambulatory down discharge.  Her housemate came to pick her up.  She understands the importance of follow-up with orthopedics for repeat imaging of hand to evaluate for fracture.  She was placed in volar wrist splint.  She will follow-up with ENT for her maxillary sinus fractures.   Dispostion:  After consideration of the diagnostic results and the patients response to treatment, I feel that the patent would benefit from discharge home     Final Clinical Impression(s) / ED Diagnoses Final diagnoses:  Facial laceration, initial encounter  Fall, initial encounter  Closed fracture of right side of maxilla, initial encounter (HCC)  Abnormal x-ray of hand    Rx / DC Orders ED Discharge Orders          Ordered    amoxicillin-clavulanate (AUGMENTIN) 875-125 MG tablet  Every 12 hours        11/28/22 0303    fluticasone (FLONASE) 50 MCG/ACT nasal spray  Daily        11/28/22 0303              Gailen Shelter, PA 11/28/22 0530    Sloan Leiter, DO 11/28/22 [phone]

## 2022-11-29 IMAGING — CT CT HEAD W/O CM
3 series · 15 of 47 positions shown, 18 images · non-contrast
Comparison: CT head 10/02/2020

CLINICAL DATA: Transient ischemic attack.  Weakness.

EXAM:
CT HEAD WITHOUT CONTRAST
TECHNIQUE: Contiguous axial images were obtained from the base of the skull
through the vertex without intravenous contrast.

[Series 2: head wo · axial · 0.47mm/px · z∈[-259,-134]mm · 9 of 31 slices shown, 12 images]
[im 3/31  brain]
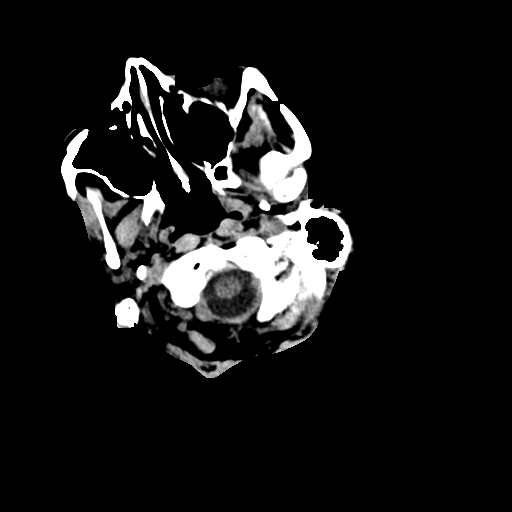
[im 3/31  bone]
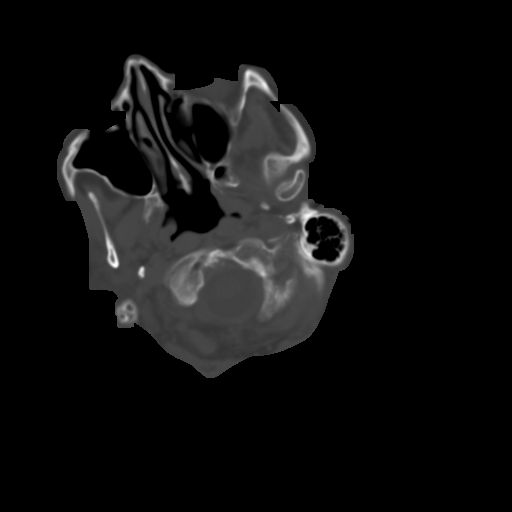
[im 6/31  brain]
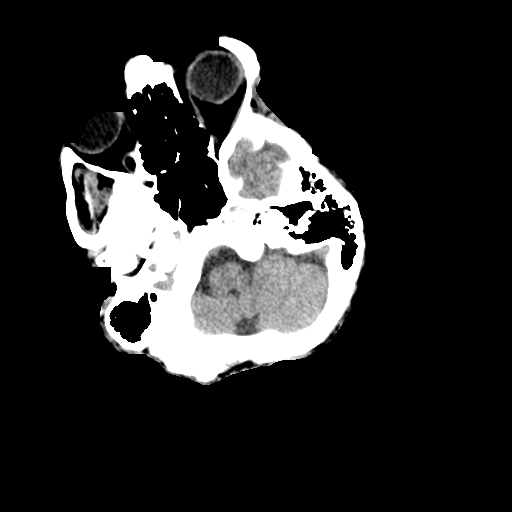
[im 9/31  brain]
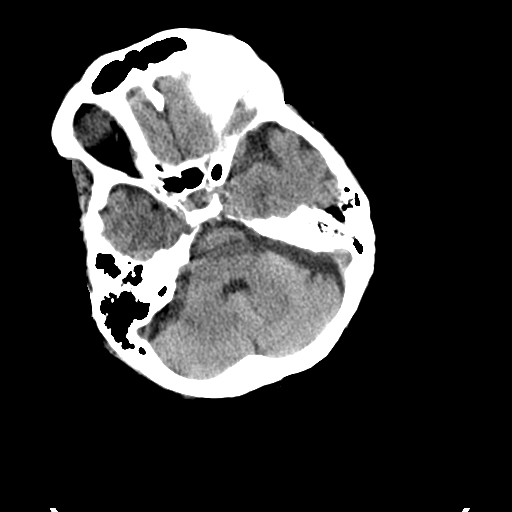
[im 12/31  brain]
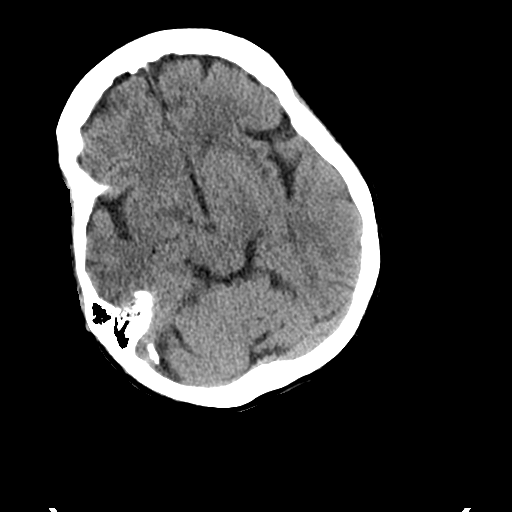
[im 16/31  brain]
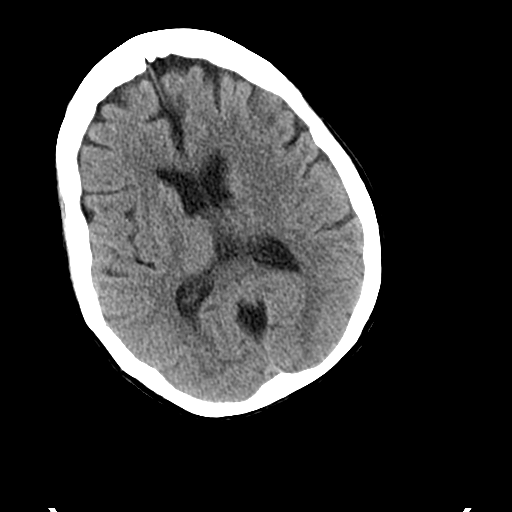
[im 16/31  bone]
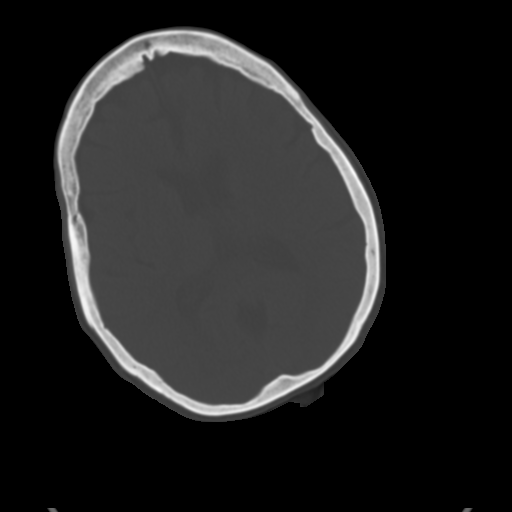
[im 19/31  brain]
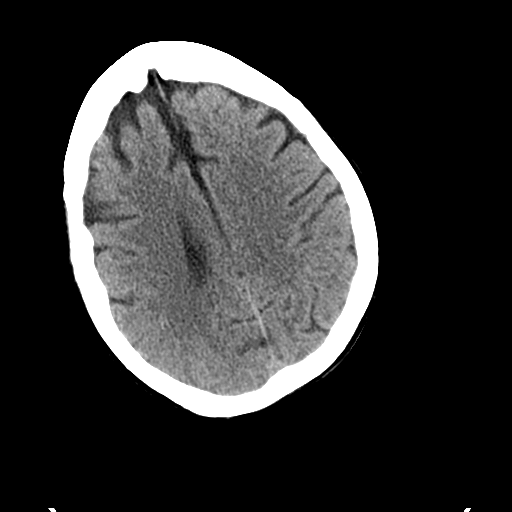
[im 22/31  brain]
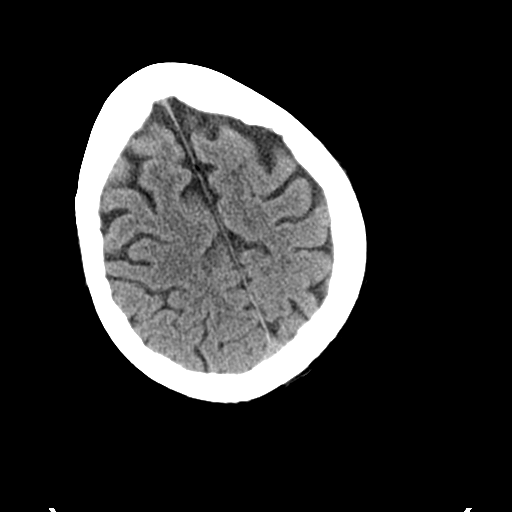
[im 25/31  brain]
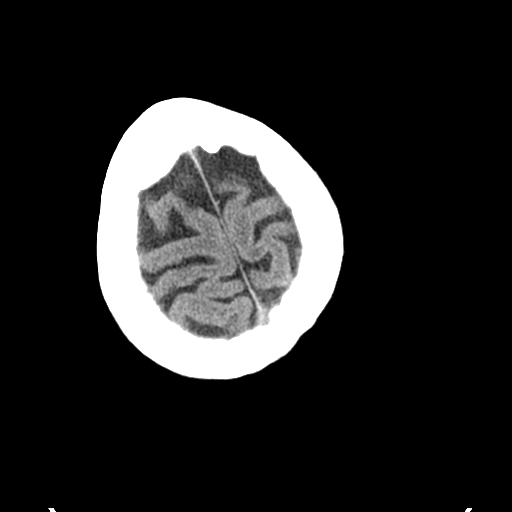
[im 28/31  brain]
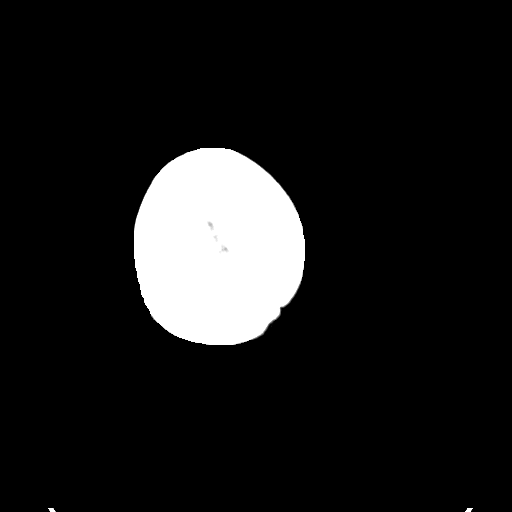
[im 28/31  bone]
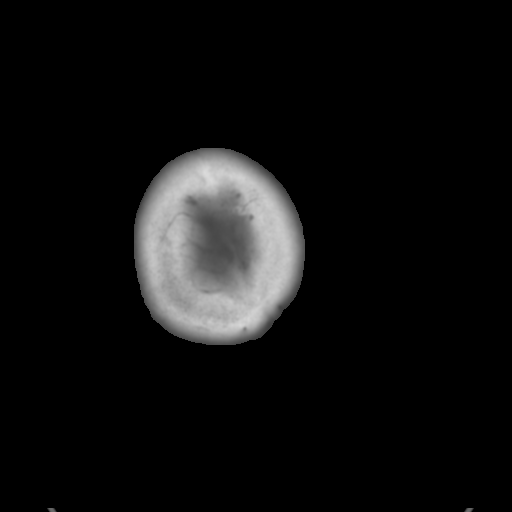

[Series 4: coronal soft tissue · coronal · 0.29mm/px · 3 of 62 slices shown]
[im 21/62  brain]
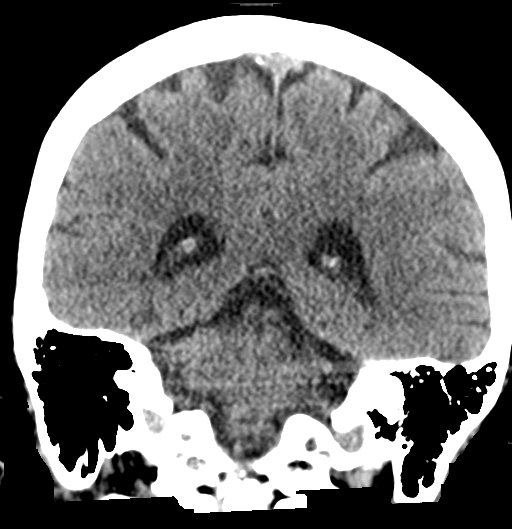
[im 28/62  brain]
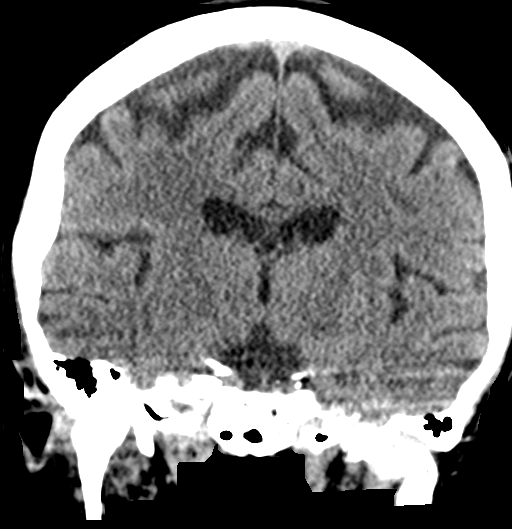
[im 34/62  brain]
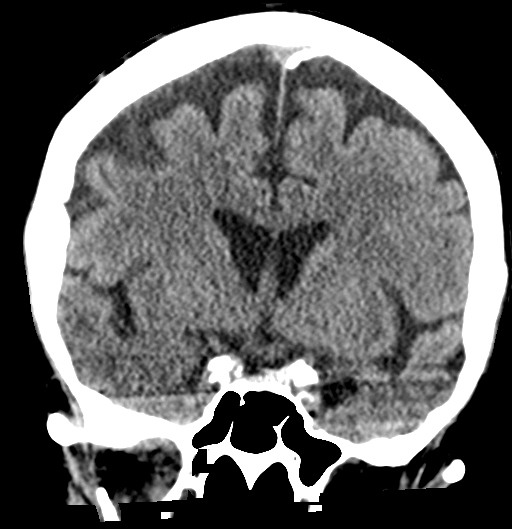

[Series 5: sagittal soft tissue · sagittal · 0.30mm/px · 3 of 51 slices shown]
[im 17/51  brain]
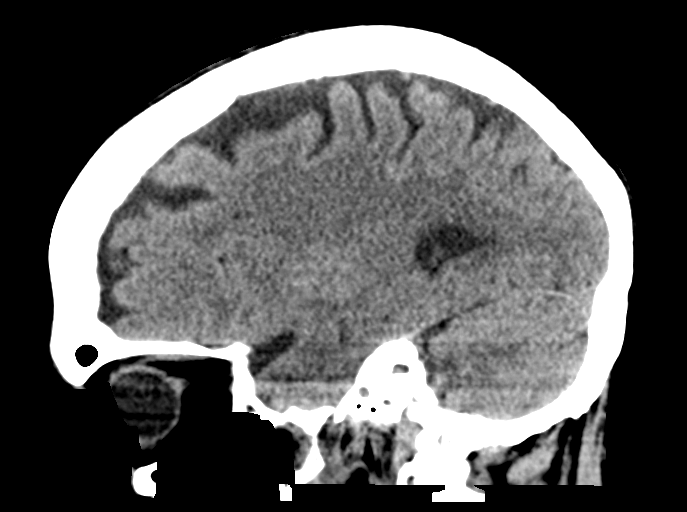
[im 26/51  brain]
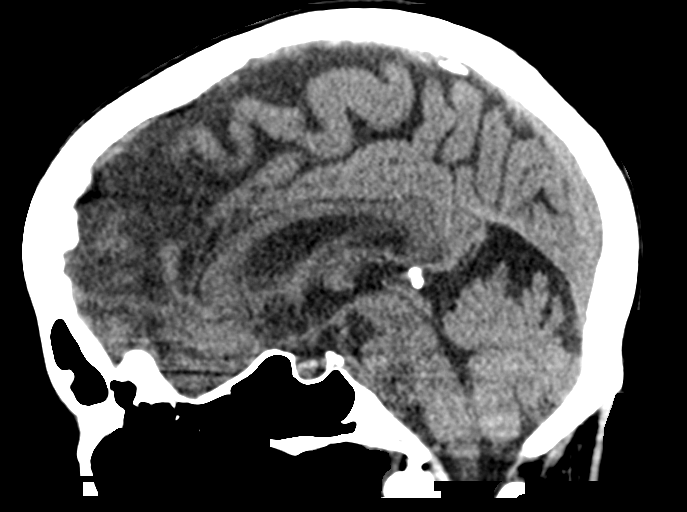
[im 34/51  brain]
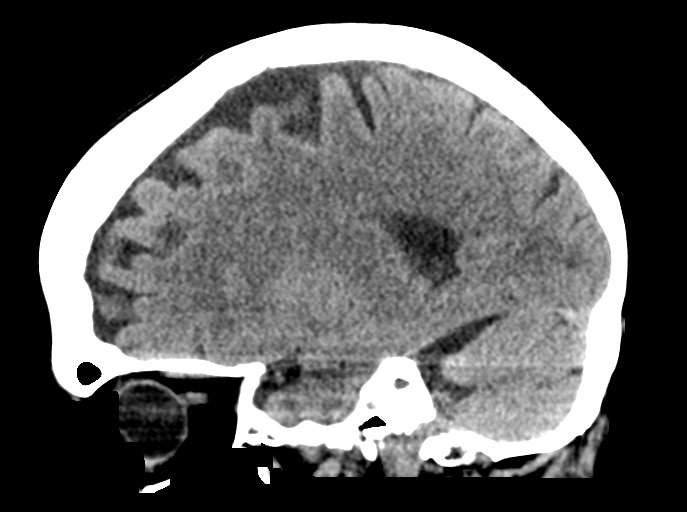

[15 of 47 positions shown; findings below may reference images not displayed]

FINDINGS: Brain:

Cerebral ventricle sizes are concordant with the degree of cerebral
volume loss. Patchy and confluent areas of decreased attenuation are
noted throughout the deep and periventricular white matter of the
cerebral hemispheres bilaterally, compatible with chronic
microvascular ischemic disease.

No evidence of large-territorial acute infarction. No parenchymal
hemorrhage. No mass lesion. No extra-axial collection.

No mass effect or midline shift. No hydrocephalus. Basilar cisterns
are patent.

Vascular: No hyperdense vessel.

Skull: No acute fracture or focal lesion.

Sinuses/Orbits: Left frontal mucosal thickening. Otherwise the
paranasal sinuses and mastoid air cells are clear. Bilateral lens
replacement. Otherwise orbits are unremarkable.

Other: None.
IMPRESSION: No acute intracranial abnormality.

## 2022-12-05 DIAGNOSIS — Z9181 History of falling: Secondary | ICD-10-CM | POA: Diagnosis not present

## 2022-12-05 DIAGNOSIS — J019 Acute sinusitis, unspecified: Secondary | ICD-10-CM | POA: Diagnosis not present

## 2022-12-05 DIAGNOSIS — M79641 Pain in right hand: Secondary | ICD-10-CM | POA: Diagnosis not present

## 2022-12-05 DIAGNOSIS — S02401A Maxillary fracture, unspecified, initial encounter for closed fracture: Secondary | ICD-10-CM | POA: Diagnosis not present

## 2022-12-07 NOTE — H&P (Signed)
Dorothy White is an 72 y.o. female.   Chief Complaint: Right small finger metacarpal base fracture HPI: Patient is a right-hand-dominant female who is slipped and fell injuring her right hand.  Patient was seen evaluate in the office and recommended undergo the above procedure.  Patient is here for the outpatient surgery today.  Past Medical History:  Diagnosis Date   Anemia    ?   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 25% mLAD, otherwise no sig disease 01/2016 cath.   NICM (nonischemic cardiomyopathy) (HCC)    a. EF 40-45% by echo 11/2015 at time of complicated migraine/neuro sx, 55-65% at time of cath 01/2016   [facility]) 06/2013   "mini" stroke , ?possibly hemaplegic migraine per pt    Past Surgical History:  Procedure Laterality Date   ANTERIOR APPROACH HEMI HIP ARTHROPLASTY Left 04/01/2017   Procedure: LEFT DIRECT ANTERIOR TOTAL HIP REPLACEMENT;  Surgeon: Tarry Kos, MD;  [facility] OR;  Service: Orthopedics;  Laterality: Left;  LEFT DIRECT ANTERIOR TOTAL HIP REPLACEMENT   AUGMENTATION MAMMAPLASTY  1980   BASAL CELL CARCINOMA EXCISION     "tip of my nose"   BUNIONECTOMY Bilateral 10/2003   CARDIAC CATHETERIZATION N/A 02/20/2016   Procedure: Left Heart Cath and Coronary Angiography;  Surgeon: Corky Crafts, MD;  Location: [facility] INVASIVE CV LAB;  Service: Cardiovascular;  Laterality: N/A;   COLONOSCOPY     DILATION AND CURETTAGE OF UTERUS      FRACTURE SURGERY     IR GENERIC HISTORICAL  03/18/2016   IR SINUS/FIST TUBE CHK-NON GI 03/18/2016 Irish Lack, MD [facility]-INTERV RAD   LAPAROSCOPIC SIGMOID COLECTOMY N/A 04/25/2016   Procedure: LAPAROSCOPIC SIGMOID COLECTOMY;  Surgeon: Almond Lint, MD;  [facility];  Service: General;  Laterality: N/A;   OOPHORECTOMY Bilateral ~ 1999   ORIF HUMERUS FRACTURE Right 02/15/2015   Procedure: OPEN REDUCTION INTERNAL FIXATION (ORIF) PROXIMAL HUMERUS FRACTURE;  Surgeon: Beverely Low, MD;  [facility];  Service: Orthopedics;  Laterality: Right;   ORIF HUMERUS FRACTURE Left 04/04/2020   ORIF HUMERUS FRACTURE Left 04/04/2020   Procedure: OPEN REDUCTION INTERNAL FIXATION (ORIF) PROXIMAL HUMERUS FRACTURE;  Surgeon: Bradly Bienenstock, MD;  [facility];  Service: Orthopedics;  Laterality: Left;  regional block   ORIF ULNAR FRACTURE Left 04/04/2020   ORIF ULNAR FRACTURE Left 04/04/2020   Procedure: OPEN REDUCTION INTERNAL FIXATION (ORIF) ULNAR FRACTURE;  Surgeon: Bradly Bienenstock, MD;  [facility];  Service: Orthopedics;  Laterality: Left;  with regional block   RHINOPLASTY  1976   TONSILLECTOMY     TOTAL HIP ARTHROPLASTY Right 08/11/2020   Procedure: TOTAL HIP ARTHROPLASTY ANTERIOR APPROACH;  Surgeon: Samson Frederic, MD;  Location: [facility] ORS;  Service: Orthopedics;  Laterality: Right;   TUBAL LIGATION  ~ 1983   VAGINAL HYSTERECTOMY  ~ 1998  Family History  Problem Relation Age of Onset   Lung cancer Mother 54   Migraines Mother    Heart attack Father        Vague   Hypertension Brother    Esophageal cancer Brother    Diabetes Paternal Grandmother        questionable   Colon cancer Neg Hx    Kidney disease Neg Hx    Liver disease Neg Hx    Social History:  reports that she has been smoking cigarettes. She has a 8.50 pack-year smoking history. She has never used smokeless tobacco. She reports that she does not currently use alcohol after a past usage of about 1.0 standard drink of alcohol per  week. She reports that she does not use drugs.  Allergies:  Allergies  Allergen Reactions   Opana [Oxymorphone Hcl] Other (See Comments)    hallucinations    No medications prior to admission.    No results found for this or any previous visit (from the past 48 hour(s)). No results found.  ROS no recent illnesses or hospitalizations.  There were no vitals taken for this visit. Physical Exam  General Appearance:  Alert, cooperative, no distress, appears stated age  Head:  Normocephalic, without obvious abnormality, atraumatic  Eyes:  Pupils equal, conjunctiva/corneas clear,         Throat: Lips, mucosa, and tongue normal; teeth and gums normal  Neck: No visible masses     Lungs:   respirations unlabored  Chest Wall:  No tenderness or deformity  Heart:  Regular rate and rhythm,  Abdomen:   Soft, non-tender,         Extremities: Right hand the patient does have the deformity to the small finger metacarpal base.  Patient is able to gently wiggle her fingers her fingers are warm well-perfused.  No open wounds  Pulses: 2+ and symmetric  Skin: Skin color, texture, turgor normal, no rashes or lesions     Neurologic: Normal     Assessment/Plan Right small finger metacarpal base fracture displaced  Right small finger open reduction internal fixation versus closed reduction and percutaneous skeletal fixation.   R/B/A DISCUSSED WITH PT IN OFFICE.  PT VOICED UNDERSTANDING OF PLAN CONSENT SIGNED DAY OF SURGERY PT SEEN AND EXAMINED PRIOR TO OPERATIVE PROCEDURE/DAY OF SURGERY SITE MARKED. QUESTIONS ANSWERED WILL GO HOME FOLLOWING SURGERY   WE ARE PLANNING SURGERY FOR YOUR UPPER EXTREMITY. THE RISKS AND BENEFITS OF SURGERY INCLUDE BUT NOT LIMITED TO BLEEDING INFECTION, DAMAGE TO NEARBY NERVES ARTERIES TENDONS, FAILURE OF SURGERY TO ACCOMPLISH ITS INTENDED GOALS, PERSISTENT SYMPTOMS AND NEED FOR FURTHER SURGICAL INTERVENTION. WITH THIS IN MIND WE WILL PROCEED. I HAVE DISCUSSED WITH  THE PATIENT THE PRE AND POSTOPERATIVE REGIMEN AND THE DOS AND DON'TS. PT VOICED UNDERSTANDING AND INFORMED CONSENT SIGNED.   Dorothy White 12/10/22 @1354

## 2022-12-08 ENCOUNTER — Encounter (HOSPITAL_COMMUNITY): Payer: Self-pay | Admitting: Orthopedic Surgery

## 2022-12-08 ENCOUNTER — Other Ambulatory Visit: Payer: Self-pay

## 2022-12-08 NOTE — Pre-Procedure Instructions (Signed)
PCP - Camie Patience, FNP Cardiologist - Orbie Pyo, MD  Chest x-ray - 10/03/22 EKG - 10/06/22 ECHO - 12/31/21 Cardiac Cath - 02/20/16  Aspirin Instructions: Last dose 12/08/22  Anesthesia review: Y  Patient verbally denies any shortness of breath, fever, cough and chest pain during phone call   -------------  SDW INSTRUCTIONS given:  Your procedure is scheduled on Wednesday Dec 10, 2022.  Report to [facility] Main Entrance "A" at 1445 P.M., and check in at the Admitting office.  Call this number if you have problems the morning of surgery:  [phone]   Remember:  Do not eat or drink after midnight the night before your surgery    Take these medicines the morning of surgery with A SIP OF WATER  amoxicillin-clavulanate (AUGMENTIN)  FLUoxetine (PROZAC)  gabapentin (NEURONTIN)  omeprazole (PRILOSEC)  propranolol (INDERAL)  acetaminophen (TYLENOL)-if needed  OR oxyCODONE-acetaminophen (PERCOCET)-if needed fluticasone (FLONASE)-if needed promethazine (PHENERGAN)-if needed SUMAtriptan (IMITREX)-if needed tiZANidine (ZANAFLEX)-if needed  As of today, STOP taking any Aspirin (unless otherwise instructed by your surgeon) Aleve, Naproxen, Ibuprofen, Motrin, Advil, Goody's, BC's, all herbal medications, fish oil, and all vitamins.                      Do not wear jewelry, make up, or nail polish            Do not wear lotions, powders, perfumes/colognes, or deodorant.            Do not shave 48 hours prior to surgery.  Men may shave face and neck.            Do not bring valuables to the hospital.            [facility] is not responsible for any belongings or valuables.  Do NOT Smoke (Tobacco/Vaping) 24 hours prior to your procedure If you use a CPAP at night, you may bring all equipment for your overnight stay.   Contacts, glasses, dentures or bridgework may not be worn into surgery.      For patients admitted to the hospital, discharge time will be determined by  your treatment team.   Patients discharged the day of surgery will not be allowed to drive home, and someone needs to stay with them for 24 hours.    Special instructions:   [facility]- Preparing For Surgery  Before surgery, you can play an important role. Because skin is not sterile, your skin needs to be as free of germs as possible. You can reduce the number of germs on your skin by washing with CHG (chlorahexidine gluconate) Soap before surgery.  CHG is an antiseptic cleaner which kills germs and bonds with the skin to continue killing germs even after washing.    Oral Hygiene is also important to reduce your risk of infection.  Remember - BRUSH YOUR TEETH THE MORNING OF SURGERY WITH YOUR REGULAR TOOTHPASTE  Please do not use if you have an allergy to CHG or antibacterial soaps. If your skin becomes reddened/irritated stop using the CHG.  Do not shave (including legs and underarms) for at least 48 hours prior to first CHG shower. It is OK to shave your face.  Please follow these instructions carefully.   Shower the NIGHT BEFORE SURGERY and the MORNING OF SURGERY with DIAL Soap.   Pat yourself dry with a CLEAN TOWEL.  Wear CLEAN PAJAMAS to bed the night before surgery  Place CLEAN SHEETS on your bed the  night of your first shower and DO NOT SLEEP WITH PETS.   Day of Surgery: Please shower morning of surgery  Wear Clean/Comfortable clothing the morning of surgery Do not apply any deodorants/lotions.   Remember to brush your teeth WITH YOUR REGULAR TOOTHPASTE.   Questions were answered. Patient verbalized understanding of instructions.

## 2022-12-09 NOTE — Anesthesia Preprocedure Evaluation (Signed)
Anesthesia Evaluation  Patient identified by MRN, date of birth, ID band Patient awake    Reviewed: Allergy & Precautions, NPO status , Patient's Chart, lab work & pertinent test results, reviewed documented beta blocker date and time   Airway Mallampati: II  TM Distance: >3 FB Neck ROM: Full    Dental  (+) Teeth Intact, Dental Advisory Given, Missing   Pulmonary Current Smoker and Patient abstained from smoking.   Pulmonary exam normal breath sounds clear to auscultation       Cardiovascular hypertension, Pt. on home beta blockers (-) angina + CAD and + Past MI  (-) Cardiac Stents Normal cardiovascular exam Rhythm:Regular Rate:Normal     Neuro/Psych  Headaches PSYCHIATRIC DISORDERS Anxiety Depression     Neuromuscular disease CVA    GI/Hepatic Neg liver ROS,GERD  ,,  Endo/Other  negative endocrine ROS    Renal/GU negative Renal ROS     Musculoskeletal  (+) Arthritis ,    Abdominal   Peds  Hematology  (+) Blood dyscrasia, anemia   Anesthesia Other Findings Day of surgery medications reviewed with the patient.  Reproductive/Obstetrics                             Anesthesia Physical Anesthesia Plan  ASA: 3  Anesthesia Plan: Regional   Post-op Pain Management: Tylenol PO (pre-op)*   Induction: Intravenous  PONV Risk Score and Plan: 1 and TIVA, Dexamethasone and Ondansetron  Airway Management Planned: Natural Airway and Simple Face Mask  Additional Equipment:   Intra-op Plan:   Post-operative Plan:   Informed Consent: I have reviewed the patients History and Physical, chart, labs and discussed the procedure including the risks, benefits and alternatives for the proposed anesthesia with the patient or authorized representative who has indicated his/her understanding and acceptance.     Dental advisory given  Plan Discussed with: CRNA  Anesthesia Plan Comments: (PAT note  written 12/09/2022 by Shonna Chock, PA-C.  )       Anesthesia Quick Evaluation

## 2022-12-09 NOTE — Progress Notes (Signed)
Anesthesia Chart Review: SAME DAY WORK-UP  Case: [id number] Date/Time: 12/10/22 1508   Procedure: CLOSED REDUCTION METACARPAL (FINGER) right small finger and percutaneous skeletal fixation, possible open reduction (Right: Finger) - regional and Iv Sedation   Anesthesia type: Regional   Pre-op diagnosis: Right small finger metacarpal base fracture   [facility] RO[facility] / [facility]   Surgeons: Bradly Bienenstock, MD       DISCUSSION: Patient is a 72 year old female scheduled for the above procedure. She fell in her driveway on 11/27/22 after tripping over her feet. She did not lose consciousness, but hit the right side of her face and sustained a right periorbital hematoma, fractures of the right maxillary sinus superior, anterior and lateral walls, and a right 5th finger fracture. Head and C-spine images were negative. Out-patient ENT and orthopedics follow-up recommended. She was discharged on Augmentin.  History includes smoking, CAD (LHC done 02/20/16 due to CM with LVEF 40-45% with basal inferior hypokinesis on 01/03/16 echo, only mild CAD), arrhythmia (SVT, on propanolol), HLD, GERD, skin cancer (BCC), migraines (TIA versus hemiplegic migraine 2014; ataxia likely due to complicated migraine 11/2015), anemia, SDH (06/2018), chronic back pain, hysterectomy, rhinoplasty, breast augmentation, fractures (ORIF right humerus 02/15/15; left THR for femoral neck fracture 04/01/17 and right THR 08/11/20, ORIF left proximal olecranon fracture 04/04/20 & ORIF left proximal humerus fracture 04/23/20), sigmoid diverticulitis (with abscess, s/p percutaneous drain 01/2016, sigmoid colectomy 04/25/16).   She had cardiology follow-up with Dr. Lynnette Caffey on 09/22/22 for follow-up dyspnea, mild CAD, SVT, smoking, and possible fibromuscular dysplasia on carotid US. Cardiac work-up for dyspnea in 2023 was overall reassuring. 03/12/22 CCTA showed coronary calcium score of 0, mild non-obstructive CAD. 12/31/21 echo showed LVEF 60-65%, no RWMA,  normal RVSF, mildly elevated PASP, RVSP 37.6 mmHg. She reported improvement in her dyspnea. Smoking cessation encouraged. SVT controlled with propanolol. Continue ASA, statin, and BP control. 9 month follow-up planned.    She is for labs as indicated and anesthesia team evaluation on the day of surgery.    VS: Ht 5\' 5"  (1.651 m)   Wt 57.6 kg   BMI 21.13 kg/m  BP Readings from Last 3 Encounters:  11/28/22 (!) 147/70  10/03/22 (!) 152/80  09/22/22 (!) 135/90   Pulse Readings from Last 3 Encounters:  11/28/22 84  10/03/22 80  09/22/22 66     PROVIDERS: Camie Patience, FNP is PCP  Alverda Skeans, MD is cardiologist   LABS: Most recent lab results in [facility] include: Lab Results  Component Value Date   WBC 5.2 10/03/2022   HGB 13.1 10/03/2022   HCT 43.0 10/03/2022   PLT 205 10/03/2022   GLUCOSE 115 (H) 10/03/2022   CHOL 183 09/22/2022   TRIG 127 09/22/2022   HDL 64 09/22/2022   LDLCALC 97 09/22/2022   ALT 19 10/03/2022   AST 23 10/03/2022   NA 138 10/03/2022   K 4.0 10/03/2022   CL 105 10/03/2022   CREATININE 0.61 10/03/2022   BUN 18 10/03/2022   CO2 25 10/03/2022   TSH 1.336 04/22/2022    IMAGES: Xray right hand 3V 11/28/22: IMPRESSION: Suspect nondisplaced fracture at the base of the right fifth metacarpal. Correlate with point tenderness.   CT Head/ Maxillofacial/C-spine 11/28/22: IMPRESSION: 1. No acute intracranial abnormality. 2. Fractures of the right maxillary sinus superior, anterior and lateral walls. 3. Small amount of inferior extraconal soft tissue gas in the right orbit with large periorbital hematoma that no abnormality of the major  intraorbital soft tissue structures. 4. No acute fracture or static subluxation of the cervical spine.   CT Chest (over read CCTA) 03/12/22: IMPRESSION: - Mildly enlarged pulmonary artery, can be seen in the setting of pulmonary hypertension. - Mild medial basilar atelectasis. Patchy ground-glass opacities in the  right upper lobe, likely infectious/inflammatory. - No suspicious pulmonary nodules    EKG: EKG 10/03/22: Artifact, poor tracing, unclear if sinus Borderline repolarization abnormality Confirmed by Alvira Monday ([id number]) on 10/03/2022 1:51:02 PM  EKG 04/24/22: Sinus rhythm Prolonged QT interval No significant change since last tracing Confirmed by Melene Plan [phone]) on 04/23/2022 11:32:54 AM   CV: CT Coronary 03/12/22: IMPRESSION: 1. Coronary calcium score of 0. This was 1st percentile for age, sex, and race matched control. 2. Normal coronary origin with Right dominance. 3. CAD-RADS 2. Mild non-obstructive CAD (25-49%). Consider non-atherosclerotic causes of chest pain. Consider preventive therapy and risk factor modification despite zero calcium score.    Echo 12/31/21: IMPRESSIONS   1. Left ventricular ejection fraction, by estimation, is 60 to 65%. The  left ventricle has normal function. The left ventricle has no regional  wall motion abnormalities. Left ventricular diastolic parameters are  consistent with Grade I diastolic  dysfunction (impaired relaxation).   2. Right ventricular systolic function is normal. The right ventricular  size is normal. There is mildly elevated pulmonary artery systolic  pressure. The estimated right ventricular systolic pressure is 37.6 mmHg.   3. The mitral valve is normal in structure. No evidence of mitral valve  regurgitation. No evidence of mitral stenosis.   4. The aortic valve is tricuspid. Aortic valve regurgitation is not  visualized. No aortic stenosis is present.   5. The inferior vena cava is normal in size with greater than 50%  respiratory variability, suggesting right atrial pressure of 3 mmHg.  - Comparison(s): No significant change from prior study.    US Carotid 12/24/21: Summary:  - Right Carotid: There is no evidence of stenosis in the right ICA. Bead-like appearance in the distal segment with mildly elevated velocities  suggestive for fibromuscular dysplasia.  - Left Carotid: There is no evidence of stenosis in the left ICA. Bead-like appearance in the distal segment with elevated velocities suggestive for fibromuscular dysplasia.  - Vertebrals:  Bilateral vertebral arteries demonstrate antegrade flow.  - Subclavians: Normal flow hemodynamics were seen in bilateral subclavian arteries.    Long term monitor 12/19/21 - 12/22/21: - Patient had a min HR of 55 bpm, max HR of 200 bpm, and avg HR of 72 bpm. Predominant underlying rhythm was Sinus Rhythm.    EVENTS: - 19 Supraventricular Tachycardia runs occurred, the run with the fastest interval lasting 9 beats with a max rate of 200 bpm, the  longest lasting 10.1 secs with an avg rate of 142 bpm. Supraventricular Tachycardia was detected within +/- 45 seconds of symptomatic patient event(s).  - Isolated SVEs were rare (<1.0%), SVE Couplets were rare (<1.0%), and SVE Triplets were rare (<1.0%).  - Isolated VEs were rare (<1.0%), and no VE Couplets or VE Triplets were present.  - Patient triggered events corresponded with sinus rhythm and supraventricular tachycardia. - No atrial fibrillation, sustained ventricular tachyarrhythmias, or bradyarrhythmias were detected.   LHC 02/20/16: Mid LAD lesion, 25 %stenosed. The left ventricular systolic function is normal. LV end diastolic pressure is normal. The left ventricular ejection fraction is 55-65% by visual estimate. There is no aortic valve stenosis.  Continue preventive therapy. - Done after 12/18/15 echo showed LVEF 40-45%,  moderate basal inferior hypokinesis.   Nuclear stress test 01/03/16: The left ventricular ejection fraction is hyperdynamic (>65%). Nuclear stress EF: 67%. There was < 1mm of horizontal ST segment depression with T wave inversions noted in the inferolateral leads during Lexiscan infusion. There is a medium defect of moderate severity present in the basal inferoseptal, mid inferoseptal and  apical anterior location. The defect is non-reversible. No ischemia noted. This is a low risk study.      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 25% mLAD, otherwise no sig disease 01/2016 cath.   NICM (nonischemic cardiomyopathy) (HCC)    a. EF 40-45% by echo 11/2015 at time of complicated migraine/neuro sx, 55-65% at time of cath 01/2016   Pneumonia    Stroke [facility]) 06/2013   "mini" stroke , ?possibly hemaplegic migraine per pt    Past Surgical History:  Procedure Laterality Date   ANTERIOR APPROACH HEMI HIP ARTHROPLASTY Left 04/01/2017   Procedure: LEFT DIRECT ANTERIOR TOTAL HIP REPLACEMENT;  Surgeon: Tarry Kos, MD;  Location[facility];  Service: Orthopedics;  Laterality: Left;  LEFT DIRECT ANTERIOR TOTAL HIP REPLACEMENT   AUGMENTATION MAMMAPLASTY  1980   BASAL CELL CARCINOMA EXCISION     "tip of my nose"   BUNIONECTOMY Bilateral 10/2003   CARDIAC CATHETERIZATION N/A 02/20/2016   Procedure: Left Heart Cath and Coronary Angiography;  Surgeon: Corky Crafts, MD;  Location: [facility] INVASIVE CV LAB;  Service: Cardiovascular;  Laterality: N/A;   COLONOSCOPY     DILATION AND CURETTAGE OF UTERUS     FRACTURE SURGERY     IR GENERIC HISTORICAL  03/18/2016   IR SINUS/FIST TUBE CHK-NON GI 03/18/2016 Irish Lack, MD [facility]-INTER[facility] RAD   LAPAROSCOPIC SIGMOID COLECTOMY N/A 04/25/2016   Procedure: LAPAROSCOPIC  SIGMOID COLECTOMY;  Surgeon: Almond Lint, MD;  Location: [facility];  Service: General;  Laterality: N/A;   OOPHORECTOMY Bilateral ~ 1999   ORIF HUMERUS FRACTURE Right 02/15/2015   Procedure: OPEN REDUCTION INTERNAL FIXATION (ORIF) PROXIMAL HUMERUS FRACTURE;  Surgeon: Beverely Low, MD;  Location[facility];  Service: Orthopedics;  Laterality: Right;   ORIF HUMERUS FRACTURE Left 04/04/2020   ORIF HUMERUS FRACTURE Left 04/04/2020   Procedure: OPEN REDUCTION INTERNAL FIXATION (ORIF) PROXIMAL HUMERUS FRACTURE;  Surgeon: Bradly Bienenstock, MD;  [facility];  Service: Orthopedics;  Laterality: Left;  regional block   ORIF ULNAR FRACTURE Left 04/04/2020   ORIF ULNAR FRACTURE Left 04/04/2020   Procedure: OPEN REDUCTION INTERNAL FIXATION (ORIF) ULNAR FRACTURE;  Surgeon: Bradly Bienenstock, MD;  [facility];  Service: Orthopedics;  Laterality: Left;  with regional block   RHINOPLASTY  1976   TONSILLECTOMY     TOTAL HIP ARTHROPLASTY Right 08/11/2020   Procedure: TOTAL HIP ARTHROPLASTY ANTERIOR APPROACH;  Surgeon: Samson Frederic, MD;  Location: [facility] ORS;  Service: Orthopedics;  Laterality: Right;   TOTAL HIP ARTHROPLASTY Left    TOTAL SHOULDER REPLACEMENT Bilateral    TUBAL LIGATION  ~ 1983   VAGINAL HYSTERECTOMY  ~ 1998    MEDICATIONS: No current facility-administered medications for this encounter.    amoxicillin-clavulanate (AUGMENTIN) 875-125 MG tablet   aspirin EC 81 MG tablet   cetirizine (ZYRTEC) 10 MG tablet   docusate sodium (COLACE) 100 MG capsule   FLUoxetine (PROZAC) 40 MG capsule   fluticasone (FLONASE) 50 MCG/ACT nasal spray   furosemide (LASIX) 20 MG tablet   gabapentin (NEURONTIN) 100 MG capsule   omeprazole (PRILOSEC) 20 MG capsule   oxyCODONE-acetaminophen (PERCOCET) 10-325 MG tablet   promethazine (PHENERGAN) 12.5 MG tablet   propranolol (INDERAL) 10 MG tablet   SUMAtriptan (IMITREX) 100 MG tablet   tiZANidine (ZANAFLEX) 4 MG tablet   traZODone (DESYREL) 100 MG tablet    acetaminophen (TYLENOL) 500 MG tablet   atorvastatin (LIPITOR) 80 MG tablet   ferrous sulfate 325 (65 FE) MG tablet   mirtazapine (REMERON) 15 MG tablet    Shonna Chock, PA-C Surgical Short Stay/Anesthesiology [place] Phone [phone] [facility] Phone [phone] 12/09/2022 12:30 PM

## 2022-12-10 ENCOUNTER — Ambulatory Visit (HOSPITAL_COMMUNITY): Payer: No Typology Code available for payment source | Admitting: Vascular Surgery

## 2022-12-10 ENCOUNTER — Other Ambulatory Visit: Payer: Self-pay

## 2022-12-10 ENCOUNTER — Ambulatory Visit (HOSPITAL_COMMUNITY)
Admission: RE | Admit: 2022-12-10 | Discharge: 2022-12-10 | Disposition: A | Discharge status: Home / Self Care | Payer: No Typology Code available for payment source | Attending: Orthopedic Surgery | Admitting: Orthopedic Surgery

## 2022-12-10 ENCOUNTER — Encounter (HOSPITAL_COMMUNITY)
Admission: RE | Disposition: A | Discharge status: Home / Self Care | Payer: Self-pay | Source: Home / Self Care | Attending: Orthopedic Surgery

## 2022-12-10 ENCOUNTER — Ambulatory Visit (HOSPITAL_COMMUNITY): Payer: No Typology Code available for payment source

## 2022-12-10 ENCOUNTER — Ambulatory Visit (HOSPITAL_BASED_OUTPATIENT_CLINIC_OR_DEPARTMENT_OTHER): Payer: No Typology Code available for payment source | Admitting: Vascular Surgery

## 2022-12-10 ENCOUNTER — Encounter (HOSPITAL_COMMUNITY): Payer: Self-pay | Admitting: Orthopedic Surgery

## 2022-12-10 DIAGNOSIS — F17200 Nicotine dependence, unspecified, uncomplicated: Secondary | ICD-10-CM | POA: Diagnosis not present

## 2022-12-10 DIAGNOSIS — D649 Anemia, unspecified: Secondary | ICD-10-CM | POA: Diagnosis not present

## 2022-12-10 DIAGNOSIS — I10 Essential (primary) hypertension: Secondary | ICD-10-CM | POA: Insufficient documentation

## 2022-12-10 DIAGNOSIS — K219 Gastro-esophageal reflux disease without esophagitis: Secondary | ICD-10-CM | POA: Insufficient documentation

## 2022-12-10 DIAGNOSIS — I2510 Atherosclerotic heart disease of native coronary artery without angina pectoris: Secondary | ICD-10-CM | POA: Insufficient documentation

## 2022-12-10 DIAGNOSIS — R519 Headache, unspecified: Secondary | ICD-10-CM | POA: Insufficient documentation

## 2022-12-10 DIAGNOSIS — W010XXA Fall on same level from slipping, tripping and stumbling without subsequent striking against object, initial encounter: Secondary | ICD-10-CM | POA: Diagnosis not present

## 2022-12-10 DIAGNOSIS — F32A Depression, unspecified: Secondary | ICD-10-CM | POA: Insufficient documentation

## 2022-12-10 DIAGNOSIS — S62316A Displaced fracture of base of fifth metacarpal bone, right hand, initial encounter for closed fracture: Secondary | ICD-10-CM | POA: Insufficient documentation

## 2022-12-10 DIAGNOSIS — I252 Old myocardial infarction: Secondary | ICD-10-CM | POA: Diagnosis not present

## 2022-12-10 DIAGNOSIS — S63054A Dislocation of other carpometacarpal joint of right hand, initial encounter: Secondary | ICD-10-CM | POA: Diagnosis not present

## 2022-12-10 DIAGNOSIS — M1993 Secondary osteoarthritis, unspecified site: Secondary | ICD-10-CM | POA: Diagnosis not present

## 2022-12-10 DIAGNOSIS — L03011 Cellulitis of right finger: Secondary | ICD-10-CM

## 2022-12-10 DIAGNOSIS — F17210 Nicotine dependence, cigarettes, uncomplicated: Secondary | ICD-10-CM | POA: Diagnosis not present

## 2022-12-10 DIAGNOSIS — S62600A Fracture of unspecified phalanx of right index finger, initial encounter for closed fracture: Secondary | ICD-10-CM | POA: Diagnosis not present

## 2022-12-10 DIAGNOSIS — W19XXXA Unspecified fall, initial encounter: Secondary | ICD-10-CM | POA: Diagnosis not present

## 2022-12-10 DIAGNOSIS — F419 Anxiety disorder, unspecified: Secondary | ICD-10-CM | POA: Diagnosis not present

## 2022-12-10 HISTORY — DX: Pneumonia, unspecified organism: J18.9

## 2022-12-10 HISTORY — PX: FINGER CLOSED REDUCTION: SHX1633

## 2022-12-10 LAB — CBC
HCT: 37.5 % (ref 36.0–46.0)
Hemoglobin: 11.6 g/dL — ABNORMAL LOW (ref 12.0–15.0)
MCH: 27 pg (ref 26.0–34.0)
MCHC: 30.9 g/dL (ref 30.0–36.0)
MCV: 87.4 fL (ref 80.0–100.0)
Platelets: 218 10*3/uL (ref 150–400)
RBC: 4.29 MIL/uL (ref 3.87–5.11)
RDW: 17.2 % — ABNORMAL HIGH (ref 11.5–15.5)
WBC: 6.7 10*3/uL (ref 4.0–10.5)
nRBC: 0 % (ref 0.0–0.2)

## 2022-12-10 LAB — BASIC METABOLIC PANEL
Anion gap: 8 (ref 5–15)
BUN: 6 mg/dL — ABNORMAL LOW (ref 8–23)
CO2: 24 mmol/L (ref 22–32)
Calcium: 8.1 mg/dL — ABNORMAL LOW (ref 8.9–10.3)
Chloride: 108 mmol/L (ref 98–111)
Creatinine, Ser: 0.66 mg/dL (ref 0.44–1.00)
GFR, Estimated: >60 mL/min (ref >60)
Glucose, Bld: 82 mg/dL (ref 70–99)
Potassium: 3.9 mmol/L (ref 3.5–5.1)
Sodium: 140 mmol/L (ref 135–145)

## 2022-12-10 SURGERY — CLOSED REDUCTION, FRACTURE, METACARPAL BONE
Anesthesia: Monitor Anesthesia Care | Site: Finger | Laterality: Right

## 2022-12-10 MED ORDER — DEXAMETHASONE SODIUM PHOSPHATE 10 MG/ML IJ SOLN
INTRAMUSCULAR | Status: DC | PRN
  Administered 2022-12-10: 5 mg via INTRAVENOUS

## 2022-12-10 MED ORDER — LIDOCAINE 2% (20 MG/ML) 5 ML SYRINGE
INTRAMUSCULAR | Status: DC | PRN
  Administered 2022-12-10: 40 mg via INTRAVENOUS

## 2022-12-10 MED ORDER — DEXAMETHASONE SODIUM PHOSPHATE 10 MG/ML IJ SOLN
INTRAMUSCULAR | Status: AC
  Filled 2022-12-10: qty 1

## 2022-12-10 MED ORDER — ROPIVACAINE HCL 5 MG/ML IJ SOLN
INTRAMUSCULAR | Status: DC | PRN
  Administered 2022-12-10: 20 mL via PERINEURAL

## 2022-12-10 MED ORDER — ACETAMINOPHEN 325 MG PO TABS: 325.0000 mg | ORAL_TABLET | ORAL | Status: DC | PRN

## 2022-12-10 MED ORDER — LIDOCAINE 2% (20 MG/ML) 5 ML SYRINGE
INTRAMUSCULAR | Status: AC
  Filled 2022-12-10: qty 5

## 2022-12-10 MED ORDER — FENTANYL CITRATE (PF) 100 MCG/2ML IJ SOLN
INTRAMUSCULAR | Status: AC
  Administered 2022-12-10: 100 ug via INTRAVENOUS
  Filled 2022-12-10: qty 2

## 2022-12-10 MED ORDER — CHLORHEXIDINE GLUCONATE 0.12 % MT SOLN
15.0000 mL | Freq: Once | OROMUCOSAL | Status: AC
  Administered 2022-12-10: 15 mL via OROMUCOSAL
  Filled 2022-12-10: qty 15

## 2022-12-10 MED ORDER — FENTANYL CITRATE (PF) 250 MCG/5ML IJ SOLN
INTRAMUSCULAR | Status: AC
  Filled 2022-12-10: qty 5

## 2022-12-10 MED ORDER — FENTANYL CITRATE (PF) 100 MCG/2ML IJ SOLN: 100.0000 ug | Freq: Once | INTRAMUSCULAR | Status: AC

## 2022-12-10 MED ORDER — POVIDONE-IODINE 7.5 % EX SOLN
Freq: Once | CUTANEOUS | Status: DC
  Filled 2022-12-10: qty 118

## 2022-12-10 MED ORDER — ONDANSETRON HCL 4 MG/2ML IJ SOLN
INTRAMUSCULAR | Status: DC | PRN
  Administered 2022-12-10: 4 mg via INTRAVENOUS

## 2022-12-10 MED ORDER — ONDANSETRON HCL 4 MG/2ML IJ SOLN
INTRAMUSCULAR | Status: AC
  Filled 2022-12-10: qty 2

## 2022-12-10 MED ORDER — PROPOFOL 10 MG/ML IV BOLUS
INTRAVENOUS | Status: DC | PRN
  Administered 2022-12-10 (×2): 30 mg via INTRAVENOUS

## 2022-12-10 MED ORDER — ORAL CARE MOUTH RINSE: 15.0000 mL | Freq: Once | OROMUCOSAL | Status: AC

## 2022-12-10 MED ORDER — FENTANYL CITRATE (PF) 100 MCG/2ML IJ SOLN: 25.0000 ug | INTRAMUSCULAR | Status: DC | PRN

## 2022-12-10 MED ORDER — OXYCODONE HCL 5 MG/5ML PO SOLN: 5.0000 mg | Freq: Once | ORAL | Status: DC | PRN

## 2022-12-10 MED ORDER — LACTATED RINGERS IV SOLN: INTRAVENOUS | Status: DC

## 2022-12-10 MED ORDER — PROPOFOL 500 MG/50ML IV EMUL
INTRAVENOUS | Status: DC | PRN
  Administered 2022-12-10: 75 ug/kg/min via INTRAVENOUS

## 2022-12-10 MED ORDER — ONDANSETRON HCL 4 MG/2ML IJ SOLN: 4.0000 mg | Freq: Once | INTRAMUSCULAR | Status: DC | PRN

## 2022-12-10 MED ORDER — CEFAZOLIN SODIUM-DEXTROSE 2-4 GM/100ML-% IV SOLN
2.0000 g | INTRAVENOUS | Status: AC
  Administered 2022-12-10: 2 g via INTRAVENOUS
  Filled 2022-12-10: qty 100

## 2022-12-10 MED ORDER — MIDAZOLAM HCL 2 MG/2ML IJ SOLN
INTRAMUSCULAR | Status: AC
  Filled 2022-12-10: qty 2

## 2022-12-10 MED ORDER — 0.9 % SODIUM CHLORIDE (POUR BTL) OPTIME
TOPICAL | Status: DC | PRN
  Administered 2022-12-10: 1000 mL

## 2022-12-10 MED ORDER — DEXAMETHASONE SODIUM PHOSPHATE 10 MG/ML IJ SOLN
INTRAMUSCULAR | Status: DC | PRN
  Administered 2022-12-10: 10 mg

## 2022-12-10 MED ORDER — ACETAMINOPHEN 160 MG/5ML PO SOLN: 325.0000 mg | ORAL | Status: DC | PRN

## 2022-12-10 MED ORDER — MEPERIDINE HCL 25 MG/ML IJ SOLN: 6.2500 mg | INTRAMUSCULAR | Status: DC | PRN

## 2022-12-10 MED ORDER — PROPOFOL 1000 MG/100ML IV EMUL
INTRAVENOUS | Status: AC
  Filled 2022-12-10: qty 100

## 2022-12-10 MED ORDER — FENTANYL CITRATE (PF) 250 MCG/5ML IJ SOLN
INTRAMUSCULAR | Status: DC | PRN
  Administered 2022-12-10: 25 ug via INTRAVENOUS

## 2022-12-10 MED ORDER — OXYCODONE HCL 5 MG PO TABS: 5.0000 mg | ORAL_TABLET | Freq: Once | ORAL | Status: DC | PRN

## 2022-12-10 SURGICAL SUPPLY — 42 items
APL SKNCLS STERI-STRIP NONHPOA (GAUZE/BANDAGES/DRESSINGS)
BAG COUNTER SPONGE SURGICOUNT (BAG) ×1 IMPLANT
BAG SPNG CNTER NS LX DISP (BAG) ×1
BENZOIN TINCTURE PRP APPL 2/3 (GAUZE/BANDAGES/DRESSINGS) IMPLANT
BLADE CLIPPER SURG (BLADE) IMPLANT
BNDG CMPR 5X3 CHSV STRCH STRL (GAUZE/BANDAGES/DRESSINGS) ×2
BNDG CMPR 5X3 KNIT ELC UNQ LF (GAUZE/BANDAGES/DRESSINGS) ×1
BNDG COHESIVE 3X5 TAN ST LF (GAUZE/BANDAGES/DRESSINGS) IMPLANT
BNDG ELASTIC 3INX 5YD STR LF (GAUZE/BANDAGES/DRESSINGS) ×1 IMPLANT
BNDG ELASTIC 4X5.8 VLCR STR LF (GAUZE/BANDAGES/DRESSINGS) IMPLANT
BNDG GAUZE DERMACEA FLUFF 4 (GAUZE/BANDAGES/DRESSINGS) ×1 IMPLANT
BNDG GZE DERMACEA 4 6PLY (GAUZE/BANDAGES/DRESSINGS) ×1
COVER SURGICAL LIGHT HANDLE (MISCELLANEOUS) ×1 IMPLANT
CUFF TOURN SGL QUICK 18X4 (TOURNIQUET CUFF) IMPLANT
CUFF TOURN SGL QUICK 24 (TOURNIQUET CUFF) ×1
CUFF TRNQT CYL 24X4X16.5-23 (TOURNIQUET CUFF) IMPLANT
DRAPE OEC MINIVIEW 54X84 (DRAPES) ×1 IMPLANT
DRSG EMULSION OIL 3X3 NADH (GAUZE/BANDAGES/DRESSINGS) IMPLANT
GAUZE SPONGE 4X4 12PLY STRL (GAUZE/BANDAGES/DRESSINGS) IMPLANT
GAUZE XEROFORM 1X8 LF (GAUZE/BANDAGES/DRESSINGS) IMPLANT
GLOVE BIOGEL PI IND STRL 8.5 (GLOVE) ×1 IMPLANT
GLOVE SURG ORTHO 8.0 STRL STRW (GLOVE) ×1 IMPLANT
GOWN STRL REUS W/ TWL LRG LVL3 (GOWN DISPOSABLE) ×2 IMPLANT
GOWN STRL REUS W/TWL LRG LVL3 (GOWN DISPOSABLE) ×2
K-WIRE DBL TROCAR .045X4 (WIRE) ×3
KIT BASIN OR (CUSTOM PROCEDURE TRAY) ×1 IMPLANT
KIT TURNOVER KIT B (KITS) ×1 IMPLANT
KWIRE DBL TROCAR .045X4 (WIRE) IMPLANT
NS IRRIG 1000ML POUR BTL (IV SOLUTION) ×1 IMPLANT
PACK ORTHO EXTREMITY (CUSTOM PROCEDURE TRAY) ×1 IMPLANT
PAD ARMBOARD 7.5X6 YLW CONV (MISCELLANEOUS) ×2 IMPLANT
PAD CAST 3X4 CTTN HI CHSV (CAST SUPPLIES) IMPLANT
PADDING CAST COTTON 3X4 STRL (CAST SUPPLIES) ×1
SOAP 2 % CHG 4 OZ (WOUND CARE) ×1 IMPLANT
SPLINT FIBERGLASS 3X12 (CAST SUPPLIES) IMPLANT
STRIP CLOSURE SKIN 1/2X4 (GAUZE/BANDAGES/DRESSINGS) IMPLANT
SUT ETHILON 4 0 P 3 18 (SUTURE) IMPLANT
SUT NYLON ETHILON 5-0 P-3 1X18 (SUTURE) IMPLANT
SUT PROLENE 4 0 P 3 18 (SUTURE) IMPLANT
TOWEL GREEN STERILE (TOWEL DISPOSABLE) ×1 IMPLANT
TOWEL GREEN STERILE FF (TOWEL DISPOSABLE) ×1 IMPLANT
WATER STERILE IRR 1000ML POUR (IV SOLUTION) ×1 IMPLANT

## 2022-12-10 NOTE — Transfer of Care (Signed)
Immediate Anesthesia Transfer of Care Note  Patient: Dorothy White  Procedure(s) Performed: CLOSED REDUCTION METACARPAL (FINGER) right small finger and percutaneous skeletal fixation (Right: Finger)  Patient Location: PACU  Anesthesia Type:MAC and Regional  Level of Consciousness: awake, alert , and oriented  Airway & Oxygen Therapy: Patient Spontanous Breathing  Post-op Assessment: Report given to RN and Post -op Vital signs reviewed and stable  Post vital signs: Reviewed and stable  Last Vitals:  Vitals Value Taken Time  BP 131/59 12/10/22 1446  Temp    Pulse 60 12/10/22 1448  Resp 17 12/10/22 1448  SpO2 99 % 12/10/22 1448  Vitals shown include unvalidated device data.  Last Pain:  Vitals:   12/10/22 1345  TempSrc:   PainSc: 6       Patients Stated Pain Goal: 2 (12/10/22 1214)  Complications: No notable events documented.

## 2022-12-10 NOTE — Op Note (Signed)
PREOPERATIVE DIAGNOSIS: Right base of the fifth metacarpal CMC fracture dislocation  POSTOPERATIVE DIAGNOSIS: Same  ATTENDING SURGEON: Dr. Bradly Bienenstock who scrubbed and present for the entire procedure  ASSISTANT SURGEON: None  ANESTHESIA: Regional with IV sedation  OPERATIVE PROCEDURE: Closed manipulation and percutaneous skeletal fixation of an unstable fifth CMC fracture dislocation Radiographs 3 views right hand  IMPLANTS: Three 0.045 K wires  EBL: Minimal  RADIOGRAPHIC INTERPRETATION: AP lateral and oblique views of the hand do show the K wire fixation across the fifth CMC joint and into the fourth metacarpal with good alignment in all planes.  SURGICAL INDICATIONS: Patient is a right-hand-dominant female who sustained a closed injury to her right hand.  Patient was seen evaluate the office and recommend undergo the above procedure.  Risks of surgery include but not limited to bleeding infection damage nearby nerves arteries or tendons loss of motion of the wrist and digits nonunion malunion hardware failure need for further surgical invention.  A signed informed consent was obtained on the day of surgery.  SURGICAL TECHNIQUE: The patient was prepped identified in the preoperative holding area marked apart a marker made on the right hand indicate correct operative site.  The patient brought back to operating placed supine on the anesthesia table where the regional anesthetic was administered.  Preoperative antibiotics were given prior to skin incision.  A well-padded tourniquet was then placed in the right brachium and sealed with the appropriate drape.  The right upper extremity then prepped and draped normal sterile fashion.  A timeout was called the correct site was identified procedure then began.  Attention was then turned to the fifth CMC joint.  Close manipulation was then performed.  Following this two 0.045 K wires were then placed from the small finger metacarpal into the ring  finger metacarpal.  An additional K wire was then placed from the left small finger metacarpal into the fifth CMC joint.  This stabilized the fracture dislocation.  K wires were then cut and bent left out of the skin.  Xeroform bolster dressing was then applied.  Sterile compressive bandage then applied.  Final radiographs of the hand were then obtained.  The patient was then placed in well-padded volar splint.  Patient was then taken recovery room in good condition.  POSTOPERATIVE PLAN: Patient be discharged to home.  See her back in the office in approximately 9 to 10 days.  Patient be seen back in the office for x-rays out of the splint and then application of a cast.  Placed the therapy order at the first postoperative visit.  K wires out at the 4-week mark and then begin therapy.  Radiographs at each visit.

## 2022-12-10 NOTE — Discharge Instructions (Signed)
KEEP BANDAGE CLEAN AND DRY °CALL OFFICE FOR F/U APPT [phone] in 9 days °KEEP HAND ELEVATED ABOVE HEART °OK TO APPLY ICE TO OPERATIVE AREA °CONTACT OFFICE IF ANY WORSENING PAIN OR CONCERNS. °

## 2022-12-10 NOTE — Anesthesia Procedure Notes (Signed)
Anesthesia Regional Block: Supraclavicular block   Pre-Anesthetic Checklist: , timeout performed,  Correct Patient, Correct Site, Correct Laterality,  Correct Procedure, Correct Position, site marked,  Risks and benefits discussed,  Pre-op evaluation,  At surgeon's request and post-op pain management  Laterality: Right  Prep: Maximum Sterile Barrier Precautions used, chloraprep       Needles:  Injection technique: Single-shot  Needle Type: Echogenic Stimulator Needle     Needle Length: 5cm  Needle Gauge: 21     Additional Needles:   Procedures:,,,, ultrasound used (permanent image in chart),,    Narrative:  Start time: 12/10/2022 1:35 PM End time: 12/10/2022 1:40 PM Injection made incrementally with aspirations every 5 mL. Anesthesiologist: Elmer Picker, MD

## 2022-12-10 NOTE — Anesthesia Postprocedure Evaluation (Signed)
Anesthesia Post Note  Patient: Dorothy White  Procedure(s) Performed: CLOSED REDUCTION METACARPAL (FINGER) right small finger and percutaneous skeletal fixation (Right: Finger)     Patient location during evaluation: PACU Anesthesia Type: Regional and MAC Level of consciousness: awake and alert Pain management: pain level controlled Vital Signs Assessment: post-procedure vital signs reviewed and stable Respiratory status: spontaneous breathing, nonlabored ventilation, respiratory function stable and patient connected to nasal cannula oxygen Cardiovascular status: stable and blood pressure returned to baseline Postop Assessment: no apparent nausea or vomiting Anesthetic complications: no  No notable events documented.  Last Vitals:  Vitals:   12/10/22 1500 12/10/22 1515  BP: (!) 162/67 (!) 161/71  Pulse: (!) 59 (!) 59  Resp: 14 12  Temp:    SpO2: 97% 97%    Last Pain:  Vitals:   12/10/22 1515  TempSrc:   PainSc: 0-No pain                 Lemoine Goyne L Jazarah Capili

## 2022-12-10 NOTE — Anesthesia Procedure Notes (Signed)
Procedure Name: MAC Date/Time: 12/10/2022 2:05 PM  Performed by: Laruth Bouchard., CRNAPre-anesthesia Checklist: Patient identified, Emergency Drugs available, Suction available, Patient being monitored and Timeout performed Patient Re-evaluated:Patient Re-evaluated prior to induction Oxygen Delivery Method: Simple face mask Preoxygenation: Pre-oxygenation with 100% oxygen Induction Type: IV induction Placement Confirmation: positive ETCO2

## 2022-12-11 ENCOUNTER — Encounter (HOSPITAL_COMMUNITY): Payer: Self-pay | Admitting: Orthopedic Surgery

## 2022-12-18 DIAGNOSIS — M79641 Pain in right hand: Secondary | ICD-10-CM | POA: Diagnosis not present

## 2022-12-18 DIAGNOSIS — S62316D Displaced fracture of base of fifth metacarpal bone, right hand, subsequent encounter for fracture with routine healing: Secondary | ICD-10-CM | POA: Diagnosis not present

## 2022-12-25 DIAGNOSIS — M5136 Other intervertebral disc degeneration, lumbar region: Secondary | ICD-10-CM | POA: Diagnosis not present

## 2022-12-25 DIAGNOSIS — M47896 Other spondylosis, lumbar region: Secondary | ICD-10-CM | POA: Diagnosis not present

## 2022-12-25 DIAGNOSIS — M5030 Other cervical disc degeneration, unspecified cervical region: Secondary | ICD-10-CM | POA: Diagnosis not present

## 2022-12-31 ENCOUNTER — Other Ambulatory Visit: Payer: Self-pay | Admitting: Internal Medicine

## 2022-12-31 DIAGNOSIS — F330 Major depressive disorder, recurrent, mild: Secondary | ICD-10-CM

## 2023-01-06 DIAGNOSIS — S62316D Displaced fracture of base of fifth metacarpal bone, right hand, subsequent encounter for fracture with routine healing: Secondary | ICD-10-CM | POA: Diagnosis not present

## 2023-01-06 DIAGNOSIS — M79641 Pain in right hand: Secondary | ICD-10-CM | POA: Diagnosis not present

## 2023-01-14 ENCOUNTER — Ambulatory Visit: Payer: No Typology Code available for payment source | Attending: Family Medicine

## 2023-01-21 DIAGNOSIS — E559 Vitamin D deficiency, unspecified: Secondary | ICD-10-CM | POA: Diagnosis not present

## 2023-01-21 DIAGNOSIS — Z0000 Encounter for general adult medical examination without abnormal findings: Secondary | ICD-10-CM | POA: Diagnosis not present

## 2023-01-21 DIAGNOSIS — G4700 Insomnia, unspecified: Secondary | ICD-10-CM | POA: Diagnosis not present

## 2023-01-21 DIAGNOSIS — I10 Essential (primary) hypertension: Secondary | ICD-10-CM | POA: Diagnosis not present

## 2023-01-21 DIAGNOSIS — I773 Arterial fibromuscular dysplasia: Secondary | ICD-10-CM | POA: Diagnosis not present

## 2023-01-21 DIAGNOSIS — E538 Deficiency of other specified B group vitamins: Secondary | ICD-10-CM | POA: Diagnosis not present

## 2023-01-21 DIAGNOSIS — R2689 Other abnormalities of gait and mobility: Secondary | ICD-10-CM | POA: Diagnosis not present

## 2023-01-21 DIAGNOSIS — E785 Hyperlipidemia, unspecified: Secondary | ICD-10-CM | POA: Diagnosis not present

## 2023-01-21 DIAGNOSIS — E2839 Other primary ovarian failure: Secondary | ICD-10-CM | POA: Diagnosis not present

## 2023-01-21 DIAGNOSIS — F411 Generalized anxiety disorder: Secondary | ICD-10-CM | POA: Diagnosis not present

## 2023-01-21 DIAGNOSIS — R4589 Other symptoms and signs involving emotional state: Secondary | ICD-10-CM | POA: Diagnosis not present

## 2023-01-21 DIAGNOSIS — R739 Hyperglycemia, unspecified: Secondary | ICD-10-CM | POA: Diagnosis not present

## 2023-01-22 ENCOUNTER — Other Ambulatory Visit: Payer: Self-pay | Admitting: Family Medicine

## 2023-01-22 DIAGNOSIS — E2839 Other primary ovarian failure: Secondary | ICD-10-CM

## 2023-01-22 DIAGNOSIS — N6310 Unspecified lump in the right breast, unspecified quadrant: Secondary | ICD-10-CM

## 2023-01-22 IMAGING — CT CT HEAD W/O CM
3 series · 16 of 47 positions shown, 19 images · non-contrast
Comparison: October 31, 2020

CLINICAL DATA: Worsening headache.

EXAM:
CT HEAD WITHOUT CONTRAST
TECHNIQUE: Contiguous axial images were obtained from the base of the skull
through the vertex without intravenous contrast.

[Series 2: head wo · axial · 0.41mm/px · z∈[+1524,+1649]mm · 10 of 30 slices shown, 13 images]
[im 3/30  brain]
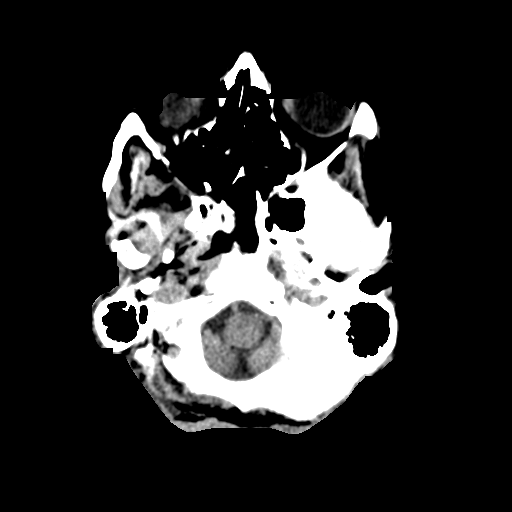
[im 3/30  bone]
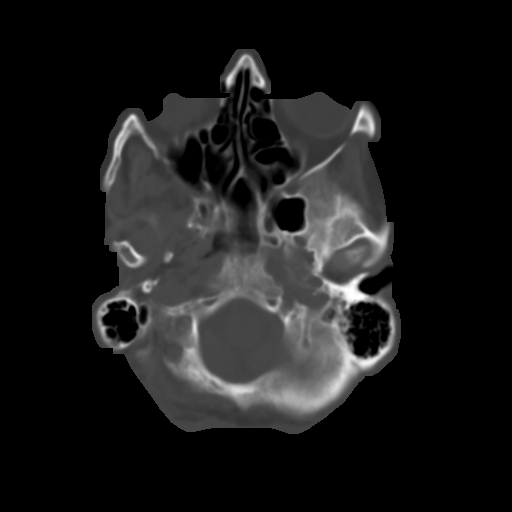
[im 6/30  brain]
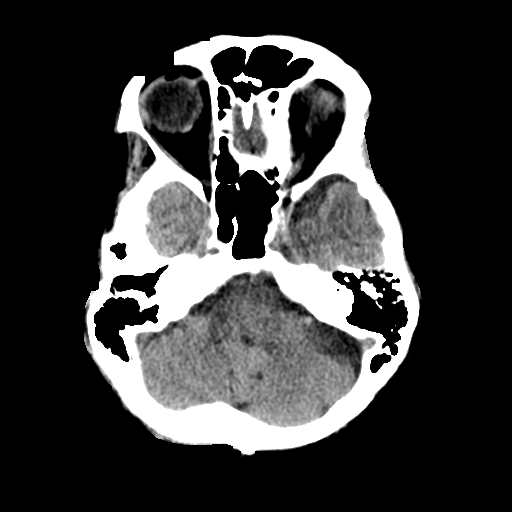
[im 9/30  brain]
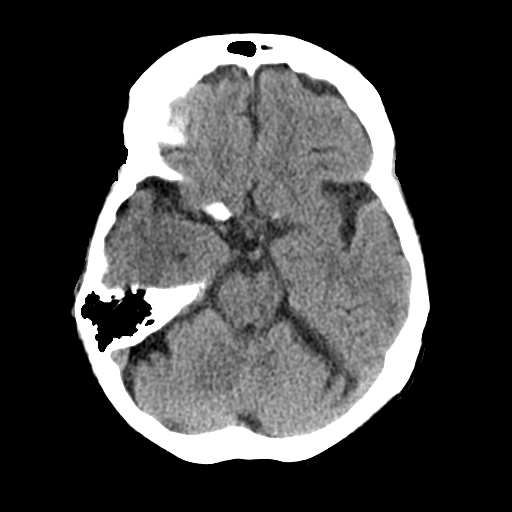
[im 11/30  brain]
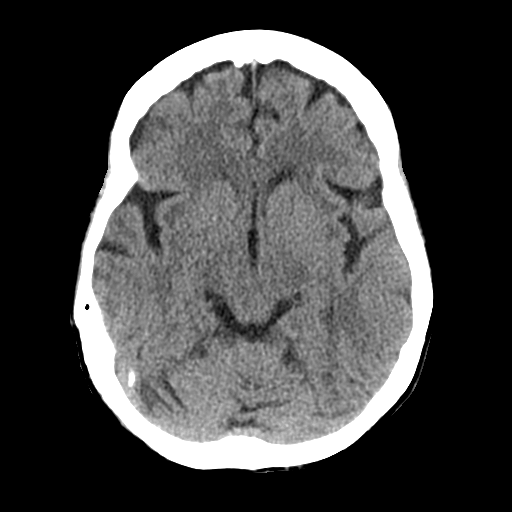
[im 14/30  brain]
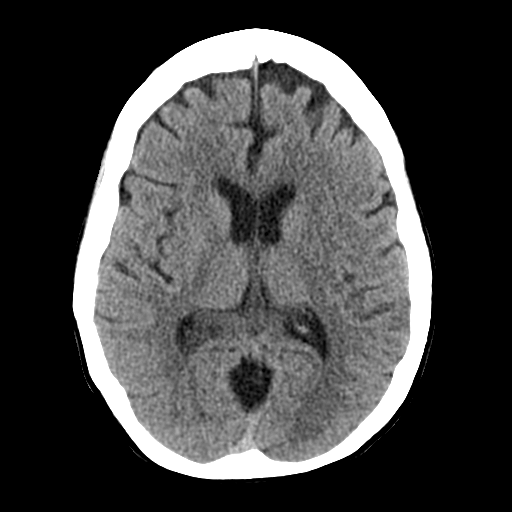
[im 14/30  bone]
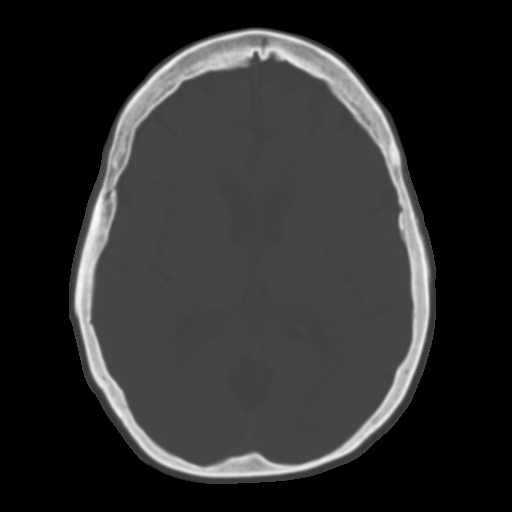
[im 17/30  brain]
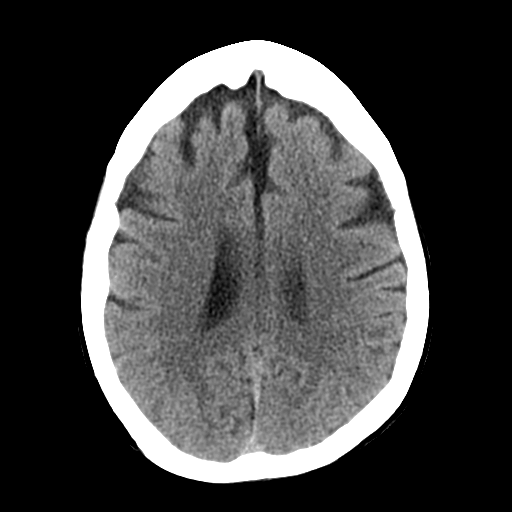
[im 20/30  brain]
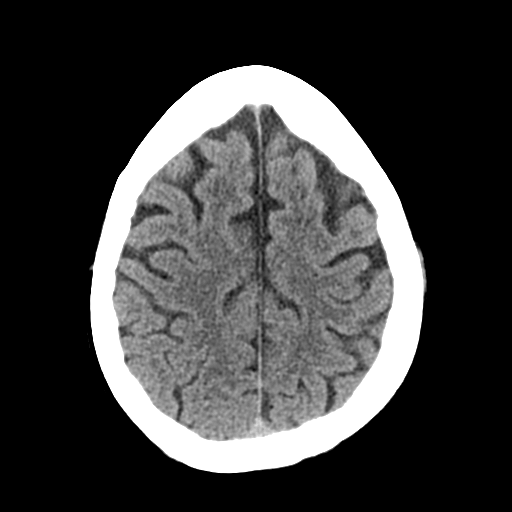
[im 23/30  brain]
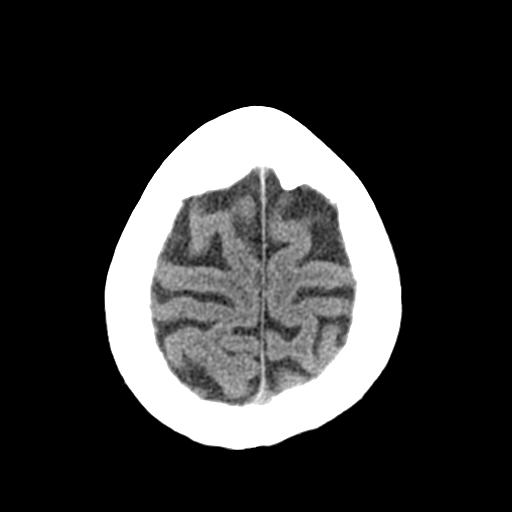
[im 25/30  brain]
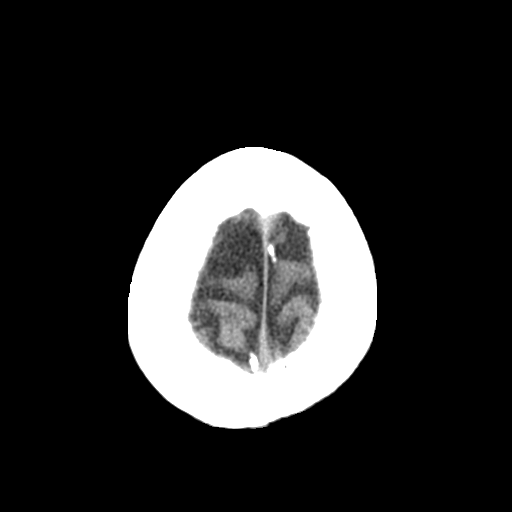
[im 25/30  bone]
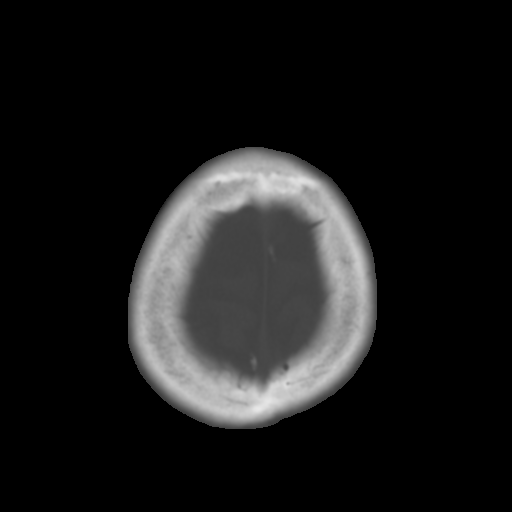
[im 28/30  brain]
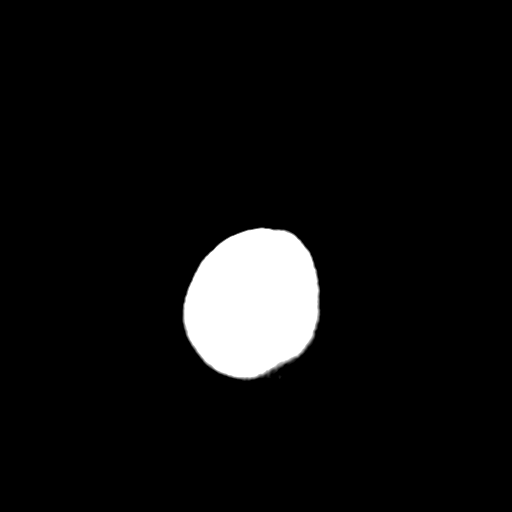

[Series 5: coronal soft tissue · coronal · 0.29mm/px · 3 of 64 slices shown]
[im 22/64  brain]
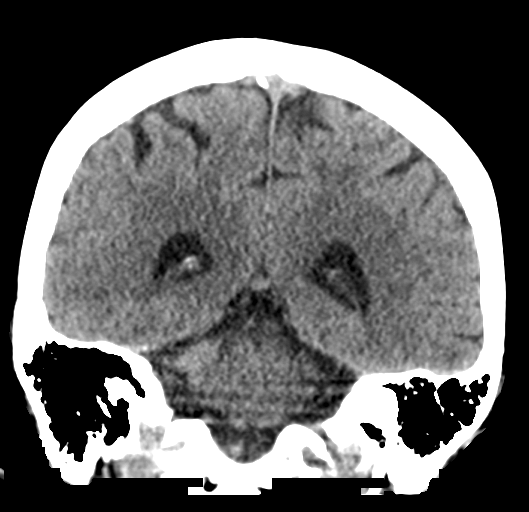
[im 29/64  brain]
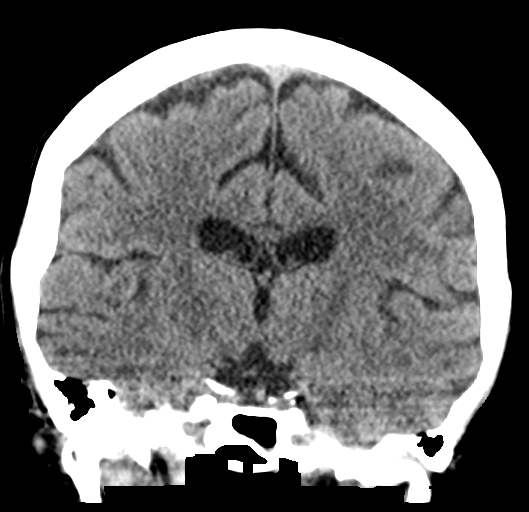
[im 36/64  brain]
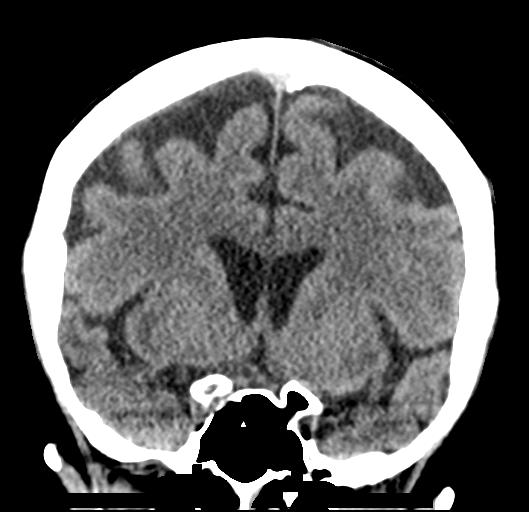

[Series 6: sagittal soft tissue · sagittal · 0.29mm/px · 3 of 52 slices shown]
[im 18/52  brain]
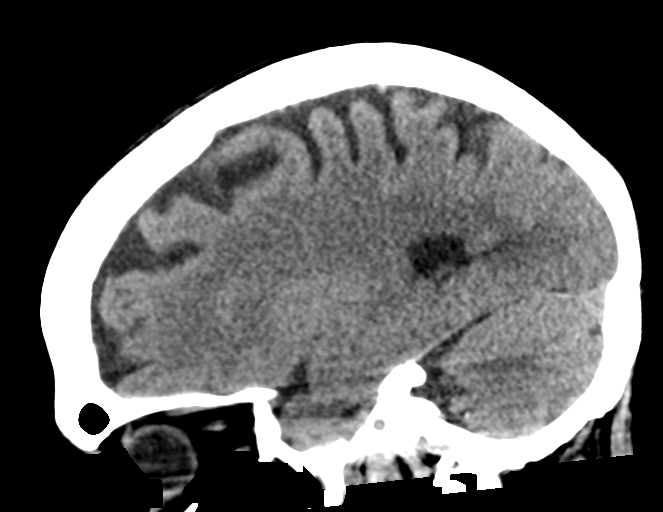
[im 26/52  brain]
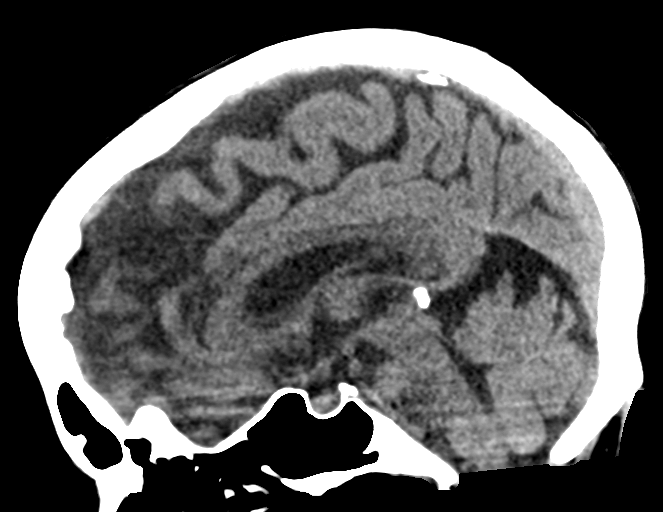
[im 35/52  brain]
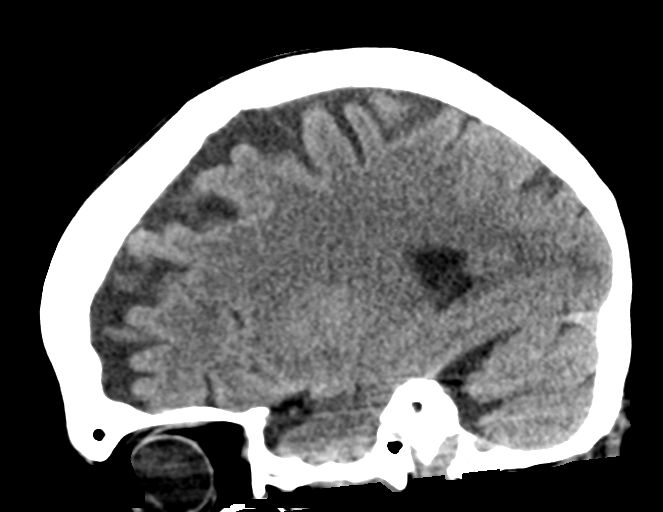

[16 of 47 positions shown; findings below may reference images not displayed]

FINDINGS: Brain: No evidence of acute infarction, hemorrhage, hydrocephalus,
extra-axial collection or mass lesion/mass effect.

Vascular: No hyperdense vessel or unexpected calcification.

Skull: Normal. Negative for fracture or focal lesion.

Sinuses/Orbits: No acute finding.

Other: None.
IMPRESSION: No acute intracranial abnormality.

Mild brain parenchymal volume loss.

## 2023-01-31 IMAGING — CR DG FOREARM 2V*L*
2 series · 2 of 2 positions shown · non-contrast
Comparison: Radiograph 03/29/2020

CLINICAL DATA: Fall today after tripping on paper with additional
falls since that time. Skin tear to left forearm.

EXAM:
LEFT FOREARM - 2 VIEW

[x forearm ap left]
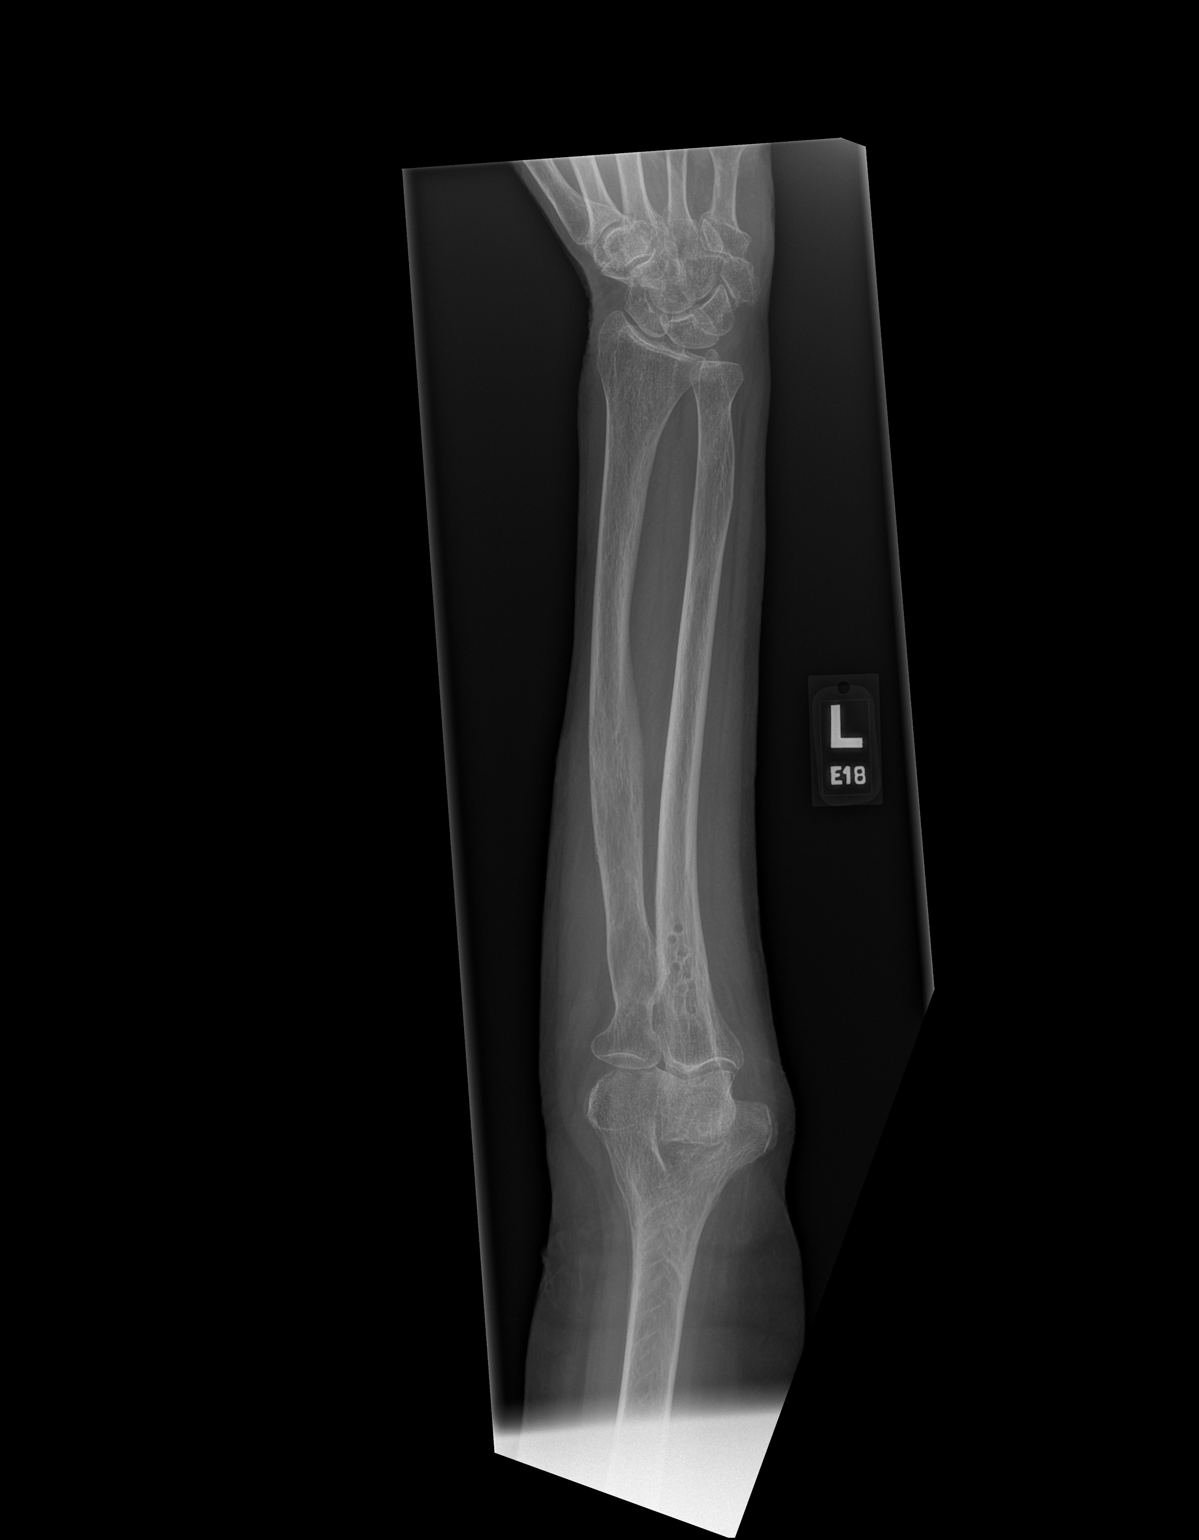

[x forearm lat left]
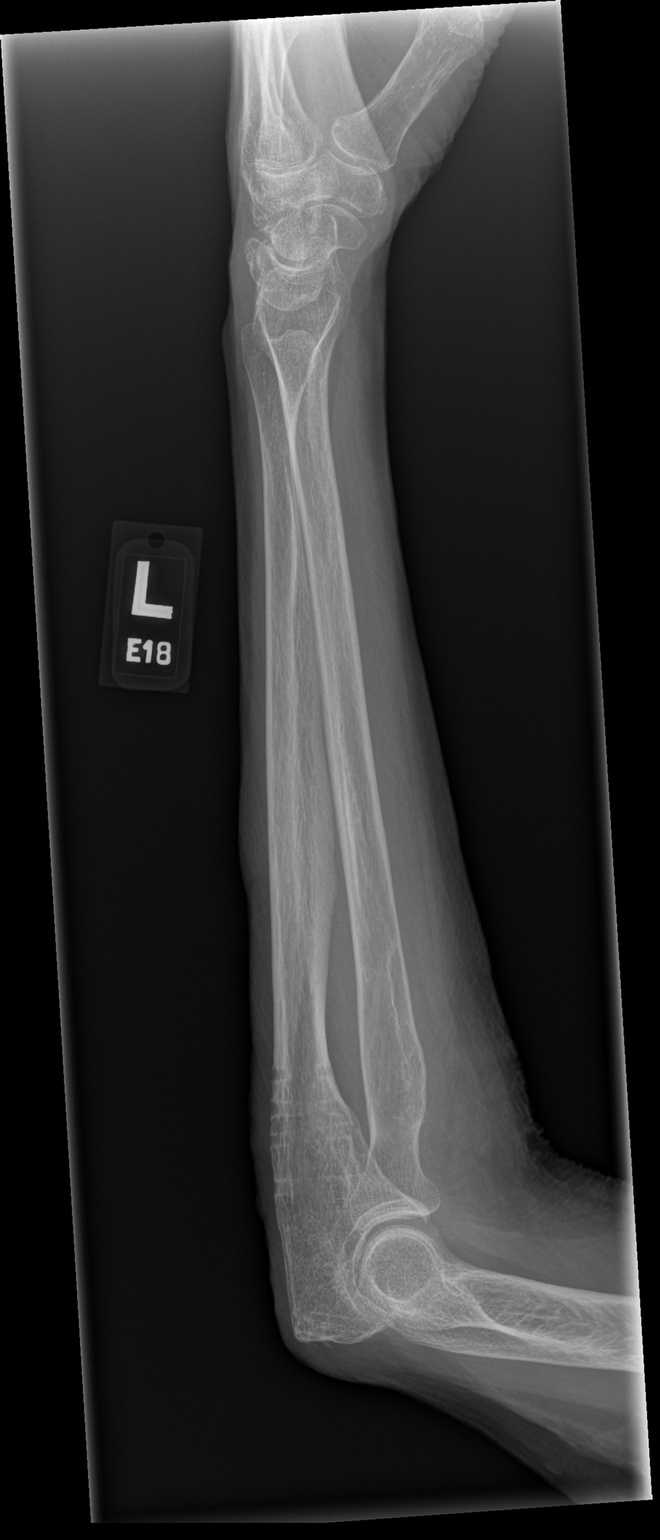

[2 of 2 positions shown; findings below may reference images not displayed]

FINDINGS: Cortical margins of the radius and ulna are intact without acute
fracture. There are ghost tracks in the proximal ulna from prior
surgical hardware. No fracture line is visible. Bones are diffusely
under mineralized. Wrist and elbow alignment are maintained. Soft
tissues are unremarkable.
IMPRESSION: 1. No acute fracture of the left forearm.
2. Osteopenia/osteoporosis.

## 2023-01-31 IMAGING — CT CT MAXILLOFACIAL W/O CM
3 series · 16 of 47 positions shown, 19 images · non-contrast
Comparison: CT head without contrast 12/24/2020

CLINICAL DATA: Facial trauma status post trip and fall.

EXAM:
CT HEAD WITHOUT CONTRAST
CT MAXILLOFACIAL WITHOUT CONTRAST
CT CERVICAL SPINE WITHOUT CONTRAST
TECHNIQUE: Multidetector CT imaging of the head, cervical spine, and
maxillofacial structures were performed using the standard protocol
without intravenous contrast. Multiplanar CT image reconstructions
of the cervical spine and maxillofacial structures were also
generated.

[Series 2: max soft · axial · 0.33mm/px · z∈[-193,-59]mm · 10 of 79 slices shown, 13 images]
[im 6/79  brain]
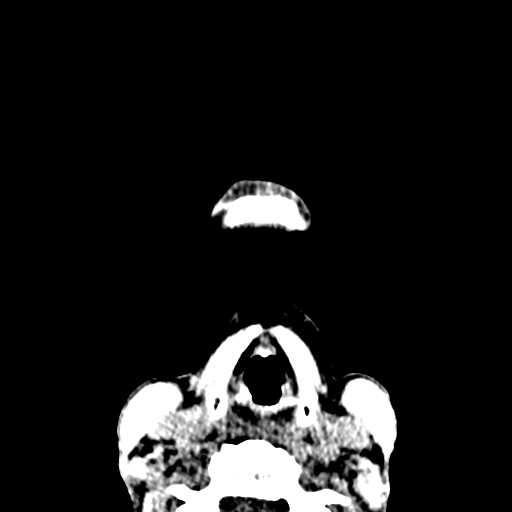
[im 6/79  bone]
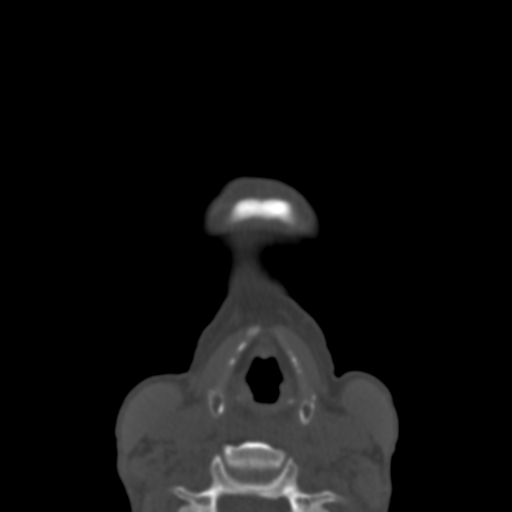
[im 14/79  bone]
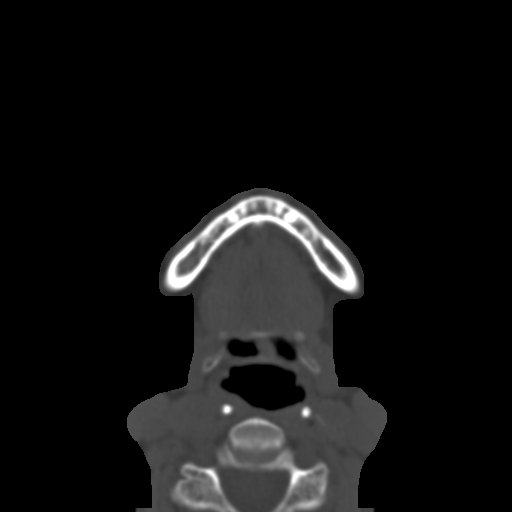
[im 22/79  bone]
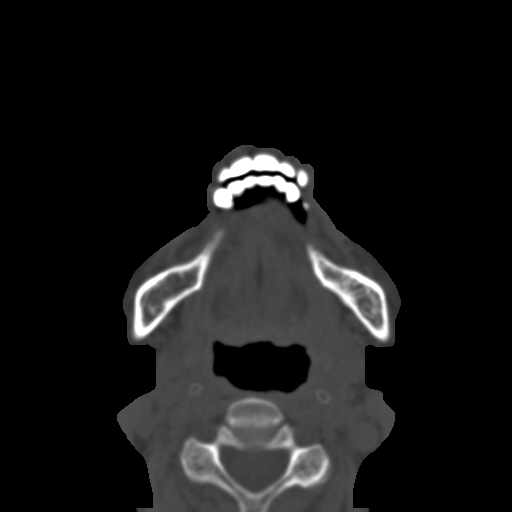
[im 27/79  bone]
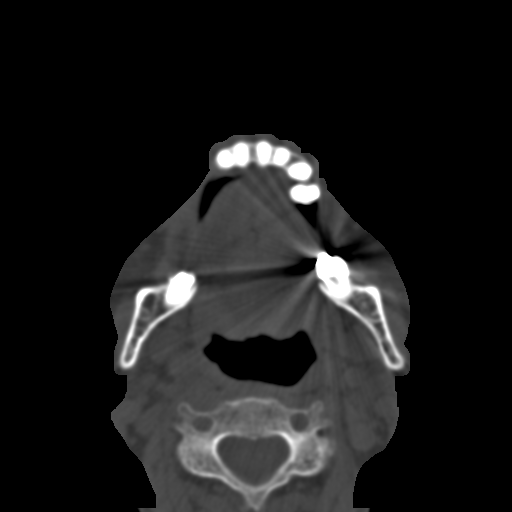
[im 35/79  brain]
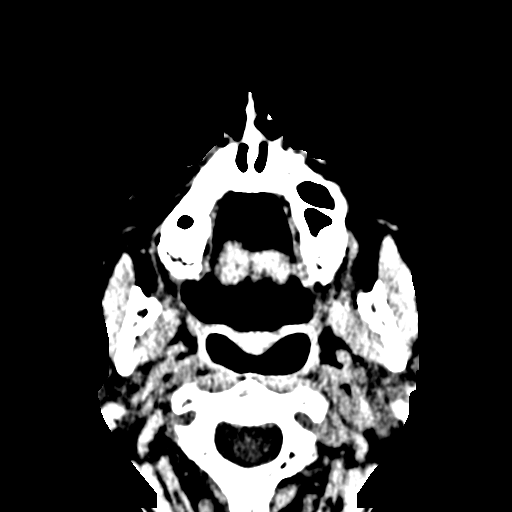
[im 35/79  bone]
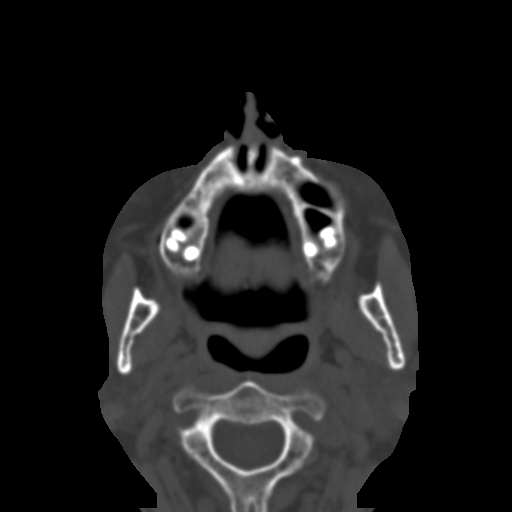
[im 44/79  bone]
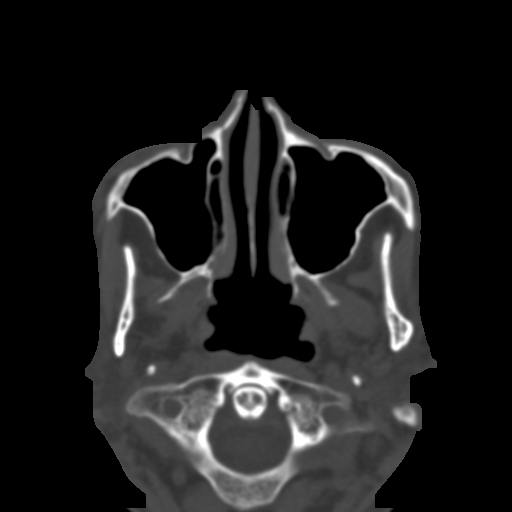
[im 52/79  bone]
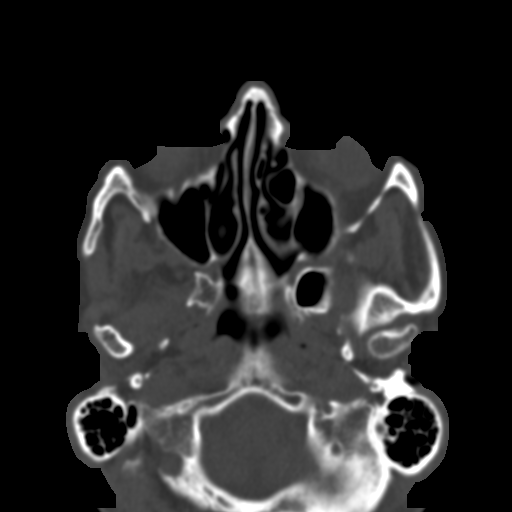
[im 60/79  bone]
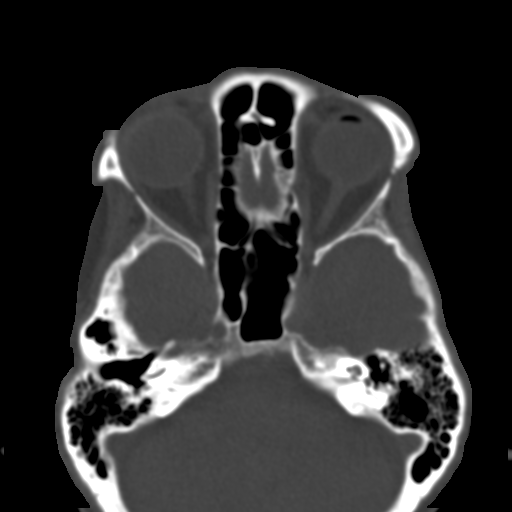
[im 65/79  brain]
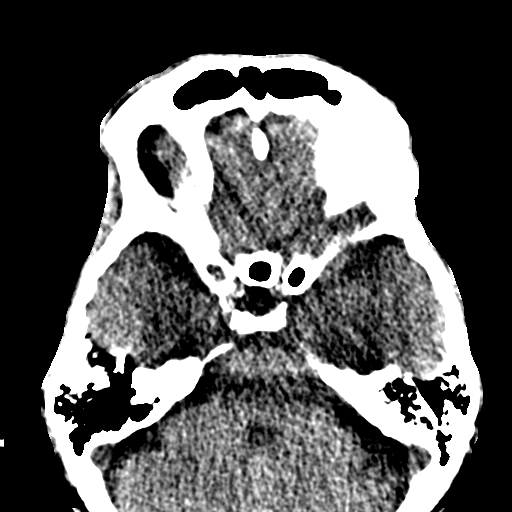
[im 65/79  bone]
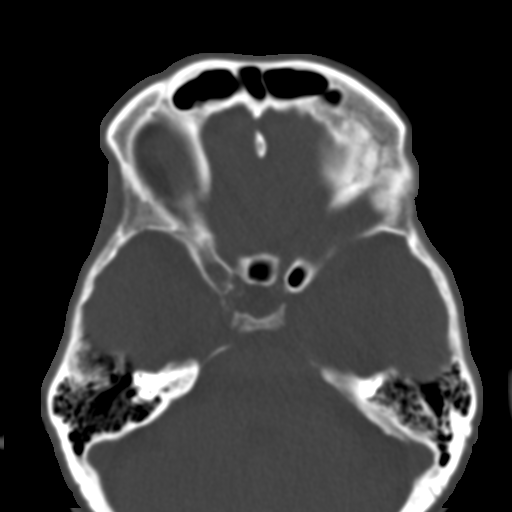
[im 73/79  bone]
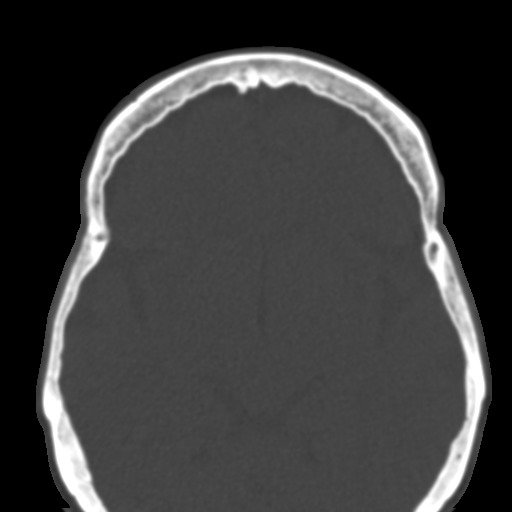

[Series 6: coronal soft · coronal · 0.33mm/px · 3 of 76 slices shown]
[im 26/76  bone]
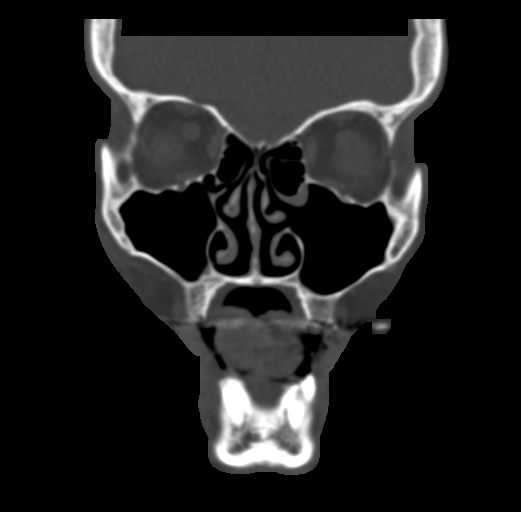
[im 34/76  bone]
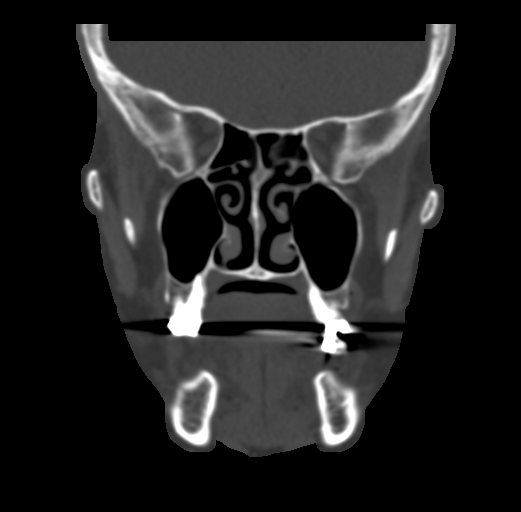
[im 42/76  bone]
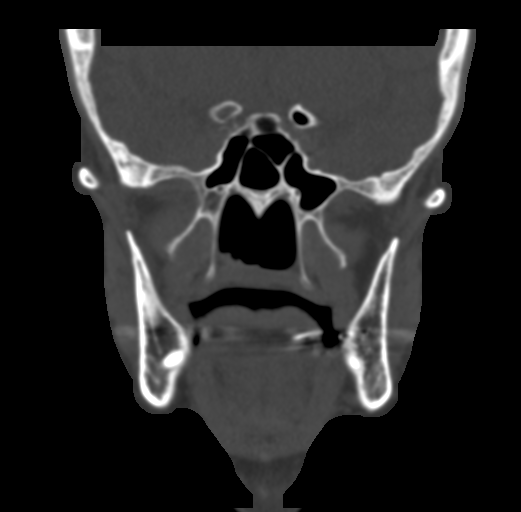

[Series 7: sagittal soft · sagittal · 0.31mm/px · 3 of 76 slices shown]
[im 26/76  bone]
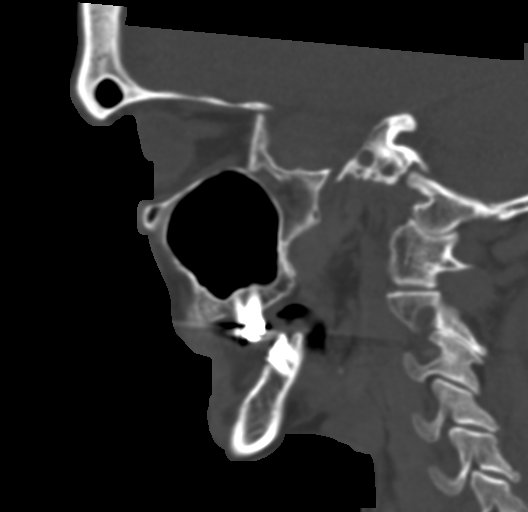
[im 38/76  bone]
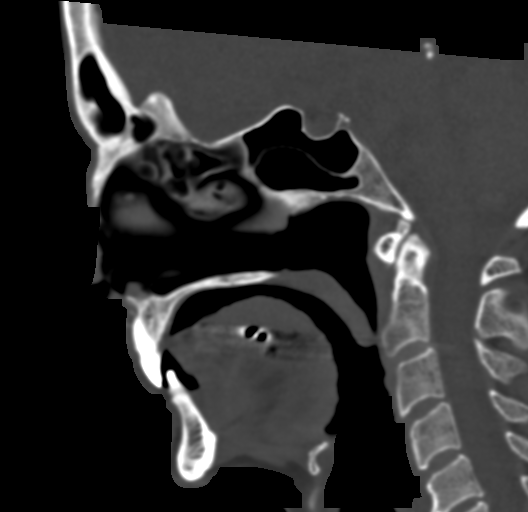
[im 51/76  bone]
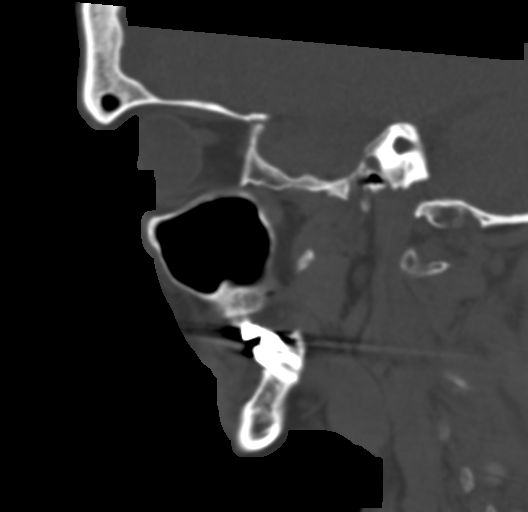

[16 of 47 positions shown; findings below may reference images not displayed]

FINDINGS: CT HEAD FINDINGS

Brain: No evidence of acute infarction, hemorrhage, hydrocephalus,
extra-axial collection or mass lesion/mass effect.

Vascular: No hyperdense vessel or unexpected calcification.

Skull: Normal. Negative for fracture or focal lesion.

Other: None.

CT MAXILLOFACIAL FINDINGS

Osseous: Chronic nasal bone deformity consistent with remote
fracture. Bony orbits, zygomatic arches, pterygoid plates, and
mandible are intact.

Orbits: Negative. No traumatic or inflammatory finding.

Sinuses: There has been prior left uncinectomy.

Sinuses and mastoid air cells are well aerated.

Soft tissues: Right facial soft tissue swelling is seen. Metallic bb
noted overlying the right zygomatic arch.

CT CERVICAL SPINE FINDINGS

Alignment: Normal.

Skull base and vertebrae: Minimally displaced fracture of the T2
spinous process best seen on image 87 of series 6.

Soft tissues and spinal canal: No prevertebral fluid or swelling. No
visible canal hematoma.

Disc levels:  Mild multilevel degenerative changes are seen.

Upper chest: Negative.

Other: None.
IMPRESSION: 1. Minimally displaced fracture of the T2 spinous process.
2. No acute abnormality of the head or facial bones.

## 2023-01-31 IMAGING — CT CT CERVICAL SPINE W/O CM
3 of 4 series · 12 of 33 positions shown, 14 images · non-contrast
Comparison: CT head without contrast 12/24/2020

CLINICAL DATA: Facial trauma status post trip and fall.

EXAM:
CT HEAD WITHOUT CONTRAST
CT MAXILLOFACIAL WITHOUT CONTRAST
CT CERVICAL SPINE WITHOUT CONTRAST
TECHNIQUE: Multidetector CT imaging of the head, cervical spine, and
maxillofacial structures were performed using the standard protocol
without intravenous contrast. Multiplanar CT image reconstructions
of the cervical spine and maxillofacial structures were also
generated.

[Series 6: orthogonal bone · axial · 0.23mm/px · z∈[-258,-158]mm · 4 of 88 slices shown, 5 images]
[im 15/88  soft-tissue]
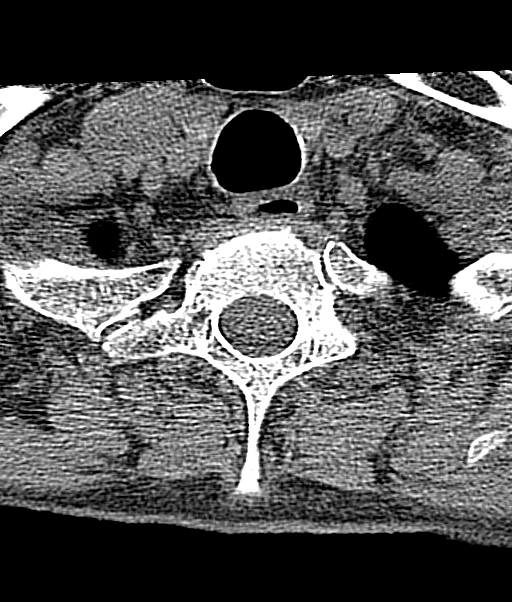
[im 15/88  bone]
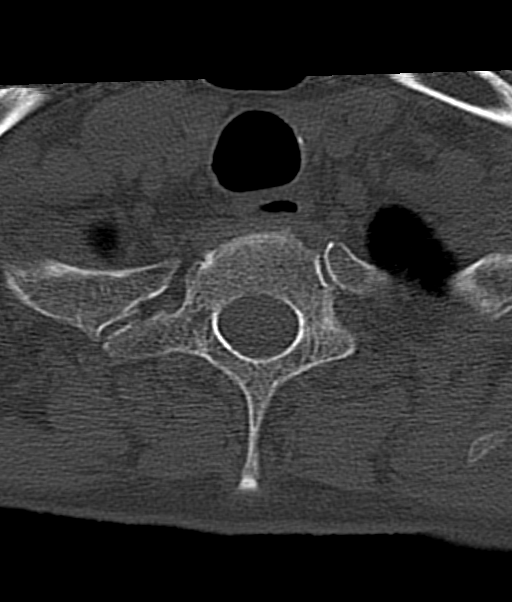
[im 30/88  bone]
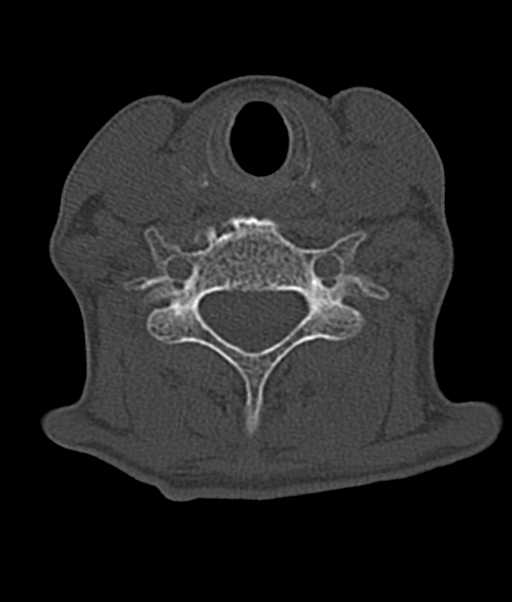
[im 59/88  bone]
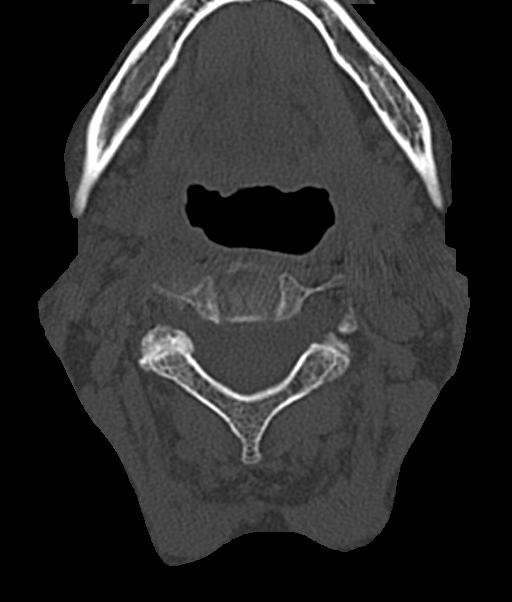
[im 73/88  bone]
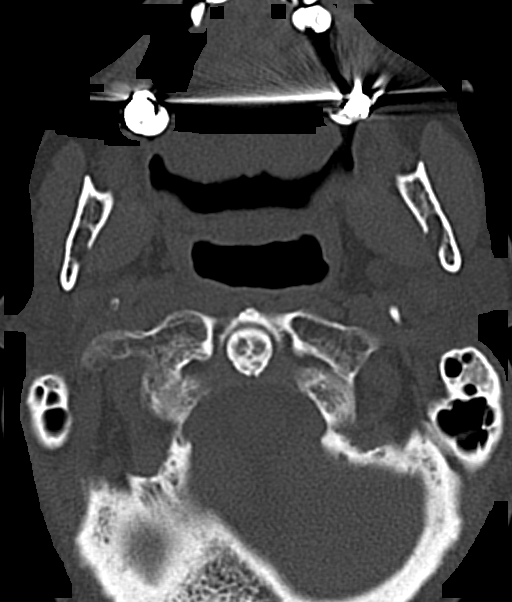

[Series 7: coronal bone · coronal · 0.23mm/px · 3 of 69 slices shown]
[im 18/69  bone]
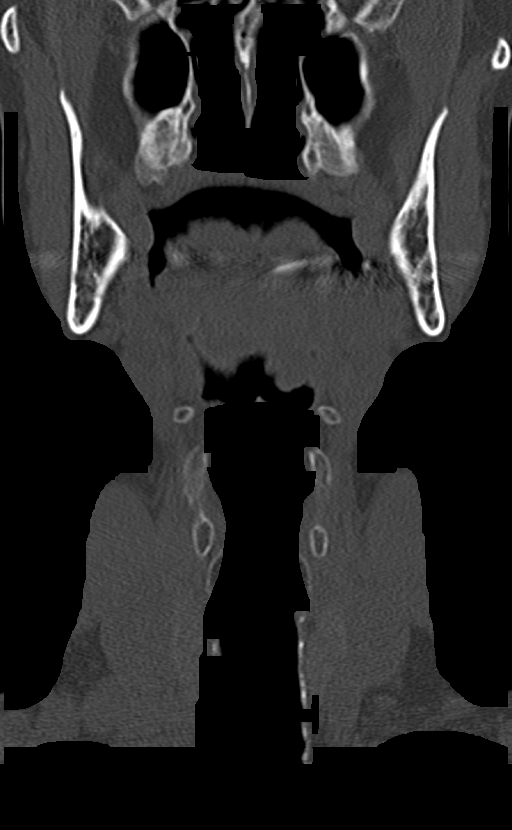
[im 29/69  bone]
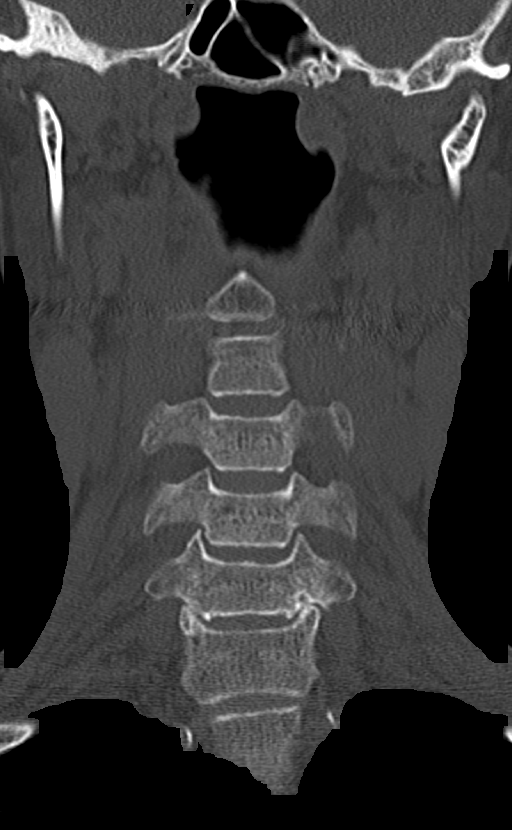
[im 40/69  bone]
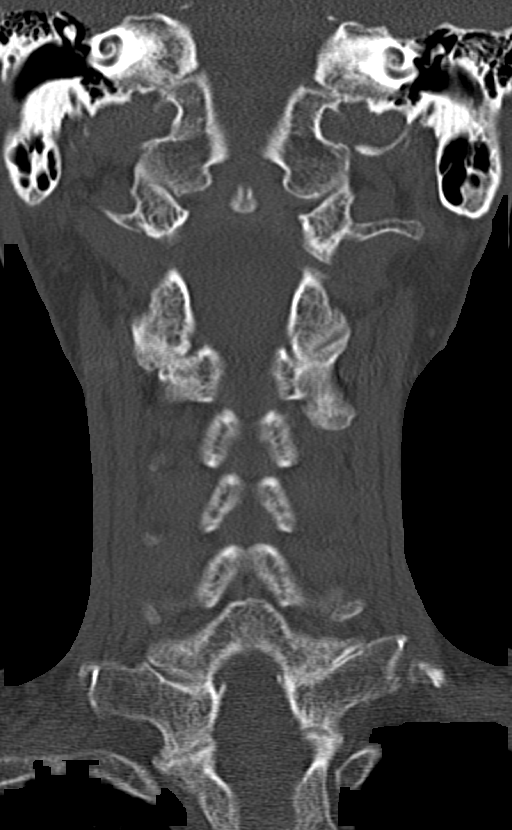

[Series 8: sagittal bone · sagittal · 0.27mm/px · 5 of 49 slices shown, 6 images]
[im 17/49  bone]
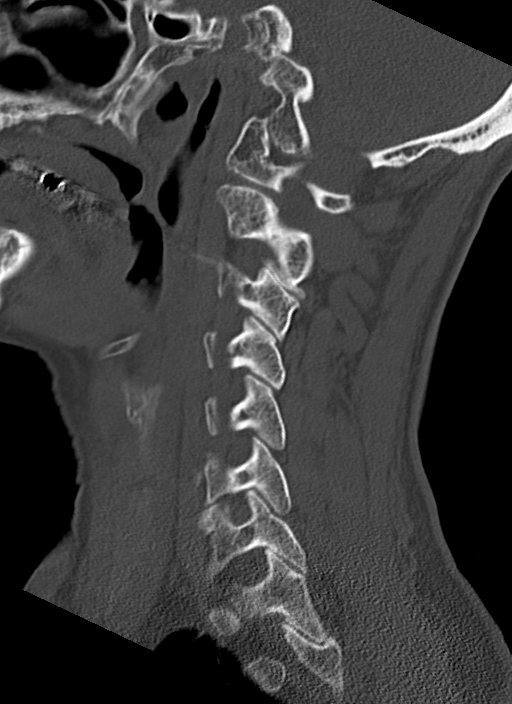
[im 21/49  bone]
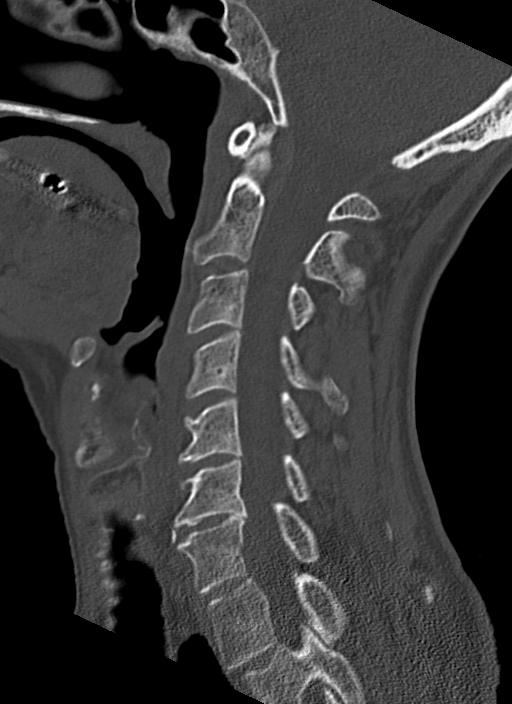
[im 25/49  soft-tissue]
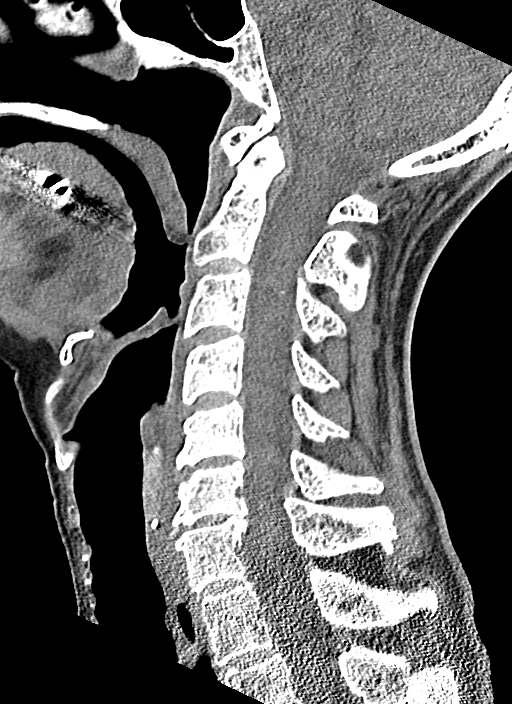
[im 25/49  bone]
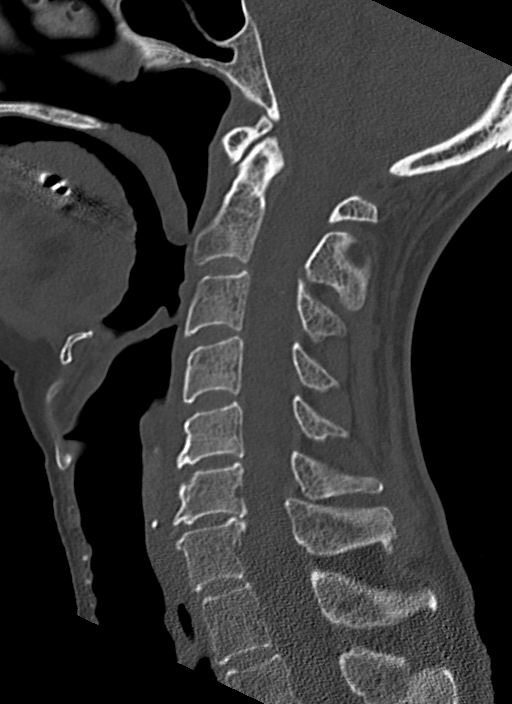
[im 29/49  bone]
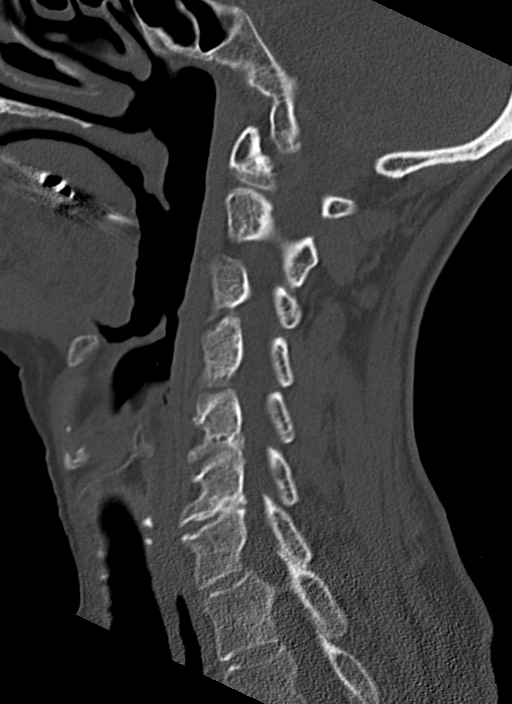
[im 33/49  bone]
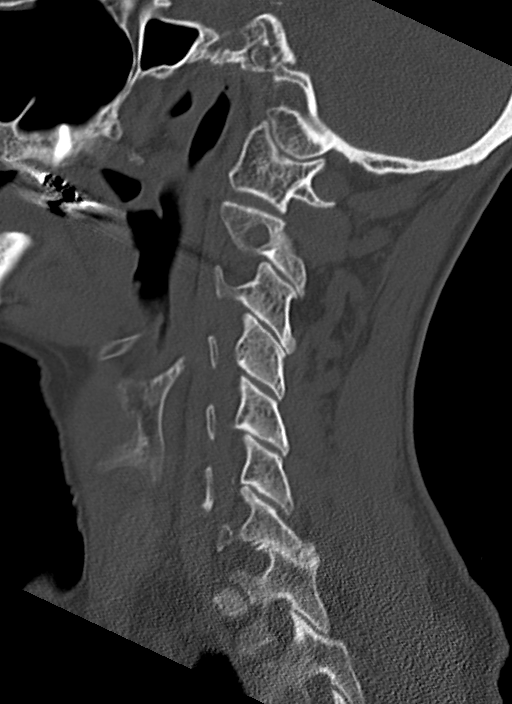

[12 of 33 positions shown; findings below may reference images not displayed]

FINDINGS: CT HEAD FINDINGS

Brain: No evidence of acute infarction, hemorrhage, hydrocephalus,
extra-axial collection or mass lesion/mass effect.

Vascular: No hyperdense vessel or unexpected calcification.

Skull: Normal. Negative for fracture or focal lesion.

Other: None.

CT MAXILLOFACIAL FINDINGS

Osseous: Chronic nasal bone deformity consistent with remote
fracture. Bony orbits, zygomatic arches, pterygoid plates, and
mandible are intact.

Orbits: Negative. No traumatic or inflammatory finding.

Sinuses: There has been prior left uncinectomy.

Sinuses and mastoid air cells are well aerated.

Soft tissues: Right facial soft tissue swelling is seen. Metallic bb
noted overlying the right zygomatic arch.

CT CERVICAL SPINE FINDINGS

Alignment: Normal.

Skull base and vertebrae: Minimally displaced fracture of the T2
spinous process best seen on image 87 of series 6.

Soft tissues and spinal canal: No prevertebral fluid or swelling. No
visible canal hematoma.

Disc levels:  Mild multilevel degenerative changes are seen.

Upper chest: Negative.

Other: None.
IMPRESSION: 1. Minimally displaced fracture of the T2 spinous process.
2. No acute abnormality of the head or facial bones.

## 2023-01-31 IMAGING — CT CT HEAD W/O CM
3 series · 15 of 47 positions shown, 18 images · non-contrast
Comparison: CT head without contrast 12/24/2020

CLINICAL DATA: Facial trauma status post trip and fall.

EXAM:
CT HEAD WITHOUT CONTRAST
CT MAXILLOFACIAL WITHOUT CONTRAST
CT CERVICAL SPINE WITHOUT CONTRAST
TECHNIQUE: Multidetector CT imaging of the head, cervical spine, and
maxillofacial structures were performed using the standard protocol
without intravenous contrast. Multiplanar CT image reconstructions
of the cervical spine and maxillofacial structures were also
generated.

[Series 2: head wo · axial · 0.42mm/px · z∈[-85,+40]mm · 9 of 30 slices shown, 12 images]
[im 3/30  brain]
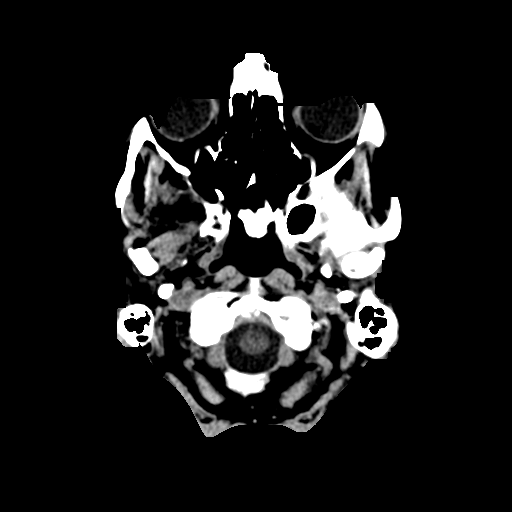
[im 3/30  bone]
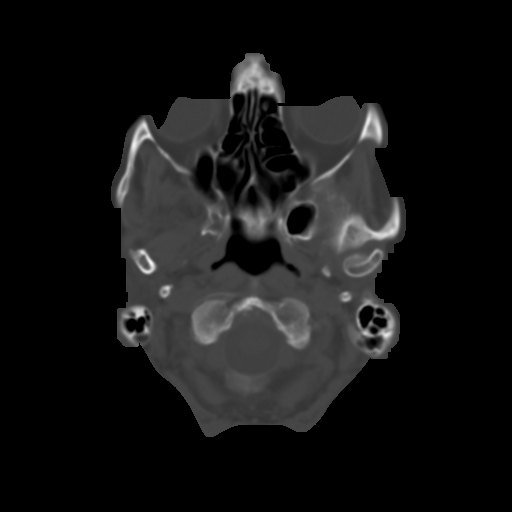
[im 6/30  brain]
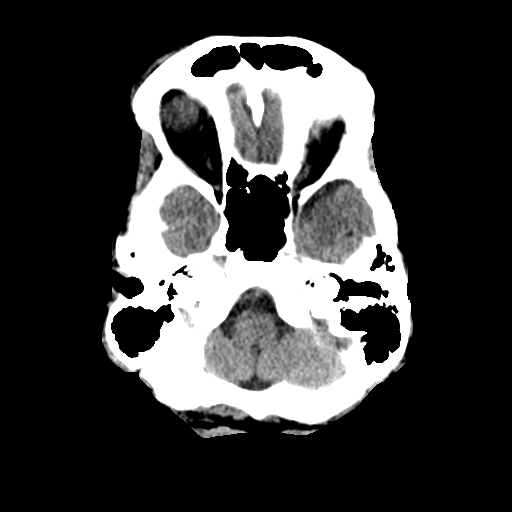
[im 9/30  brain]
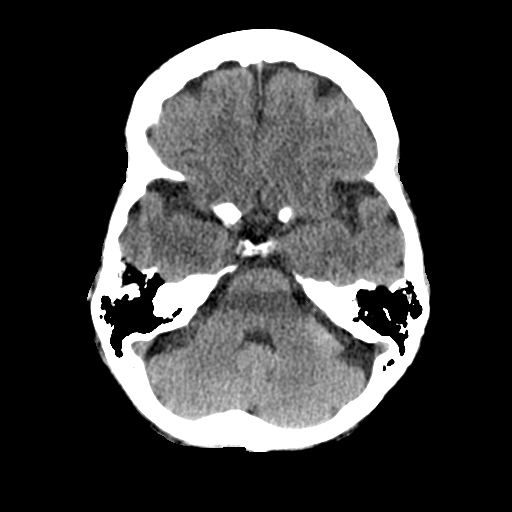
[im 12/30  brain]
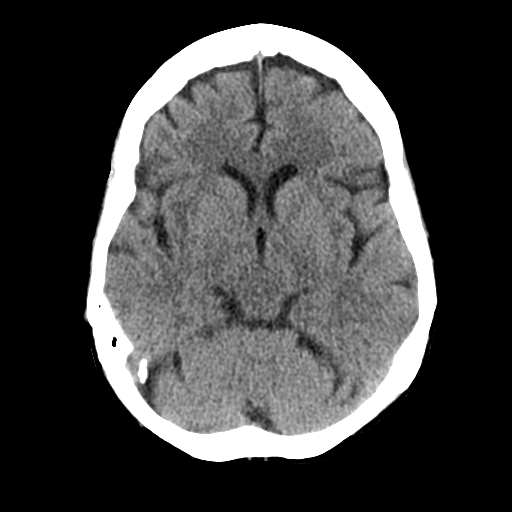
[im 16/30  brain]
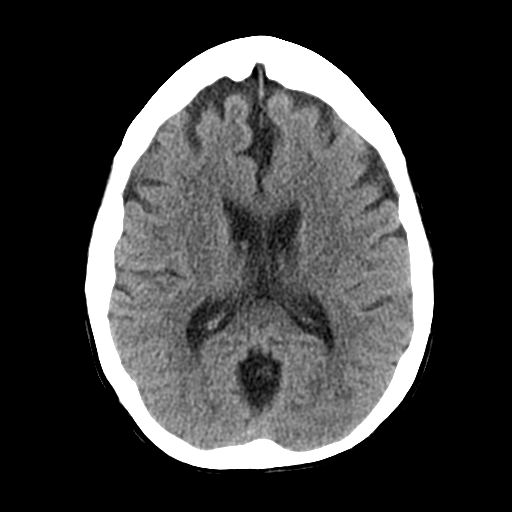
[im 16/30  bone]
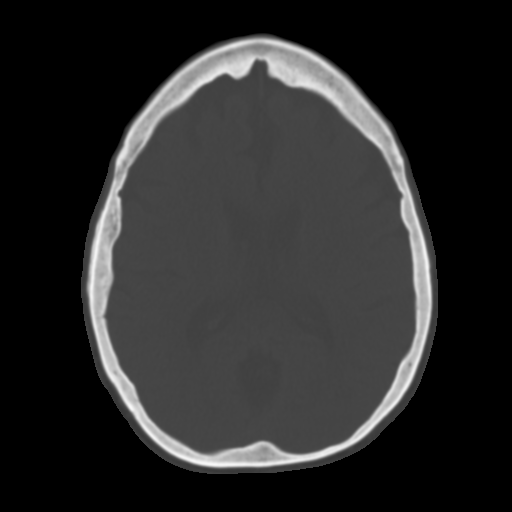
[im 19/30  brain]
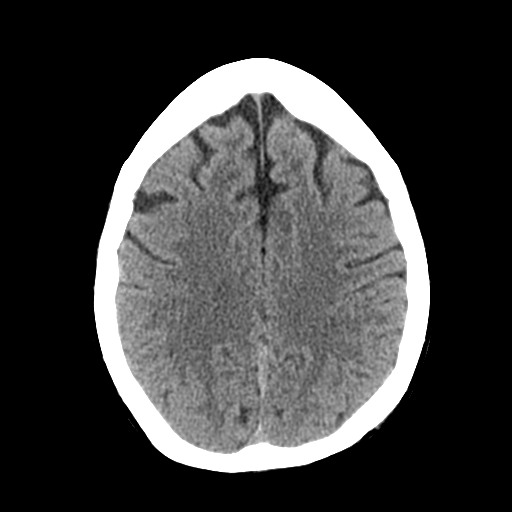
[im 22/30  brain]
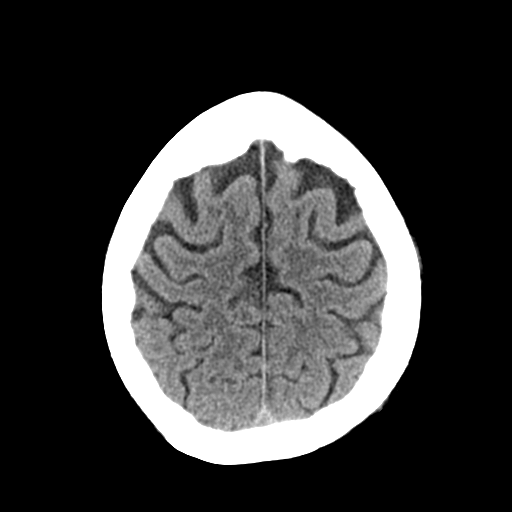
[im 25/30  brain]
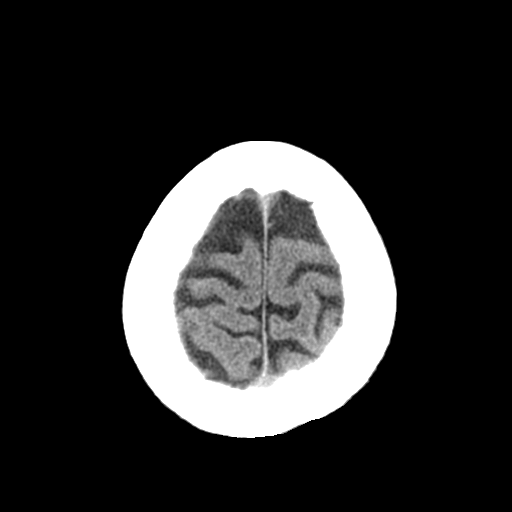
[im 28/30  brain]
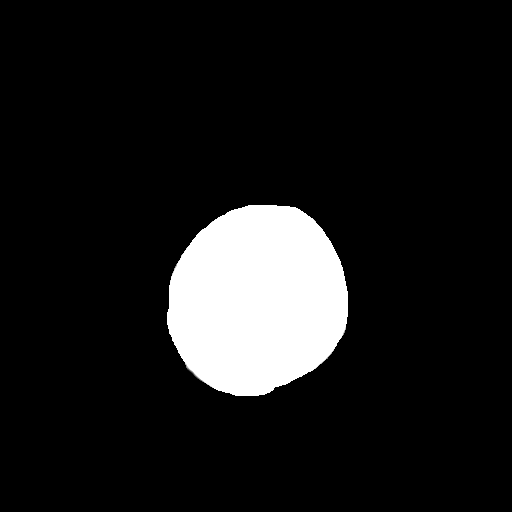
[im 28/30  bone]
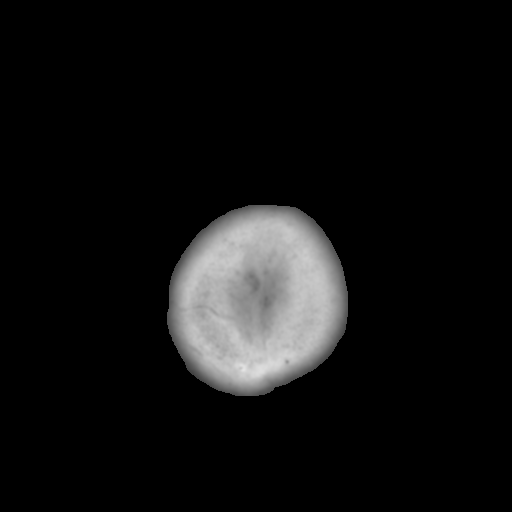

[Series 4: coronal soft tissue · coronal · 0.33mm/px · 3 of 71 slices shown]
[im 24/71  brain]
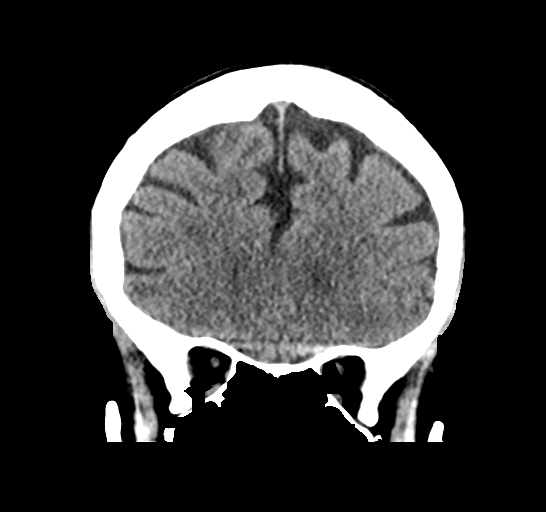
[im 32/71  brain]
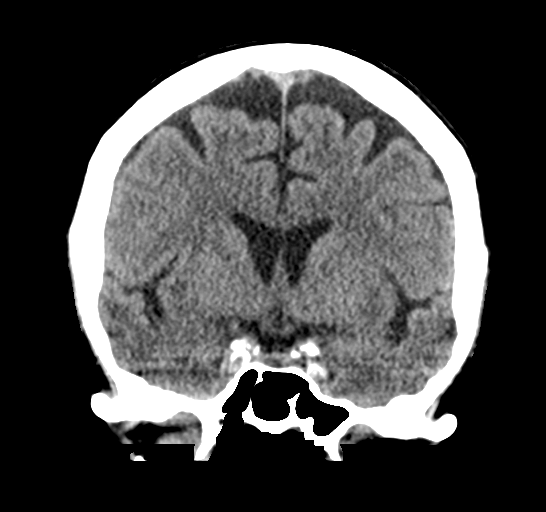
[im 39/71  brain]
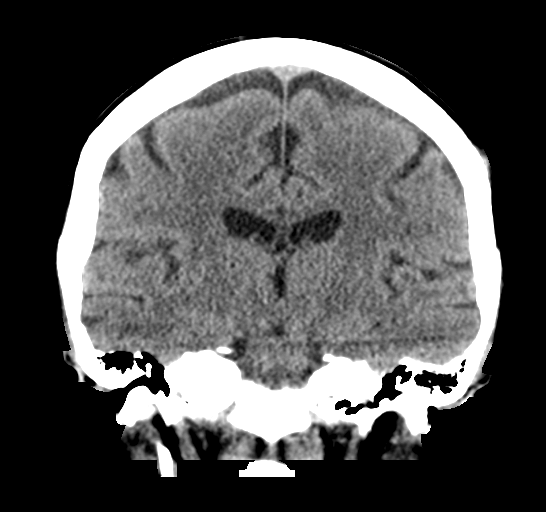

[Series 5: sagittal soft tissue · sagittal · 0.32mm/px · 3 of 56 slices shown]
[im 19/56  brain]
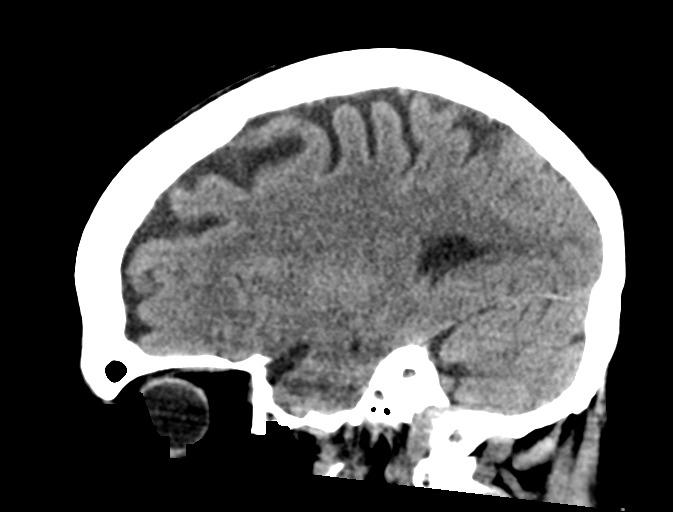
[im 28/56  brain]
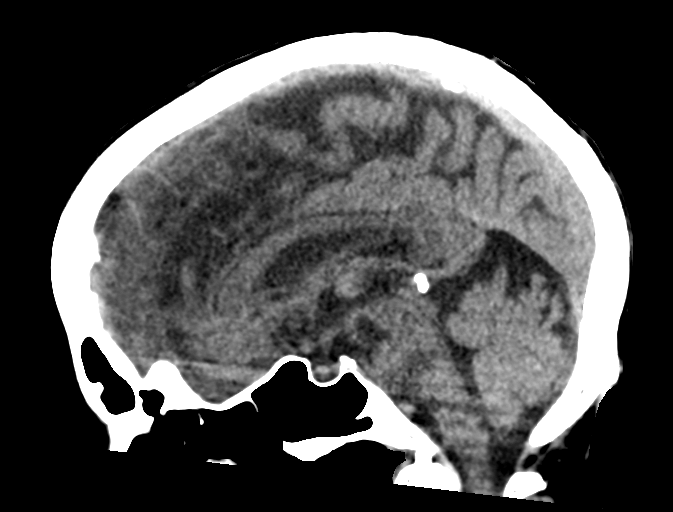
[im 37/56  brain]
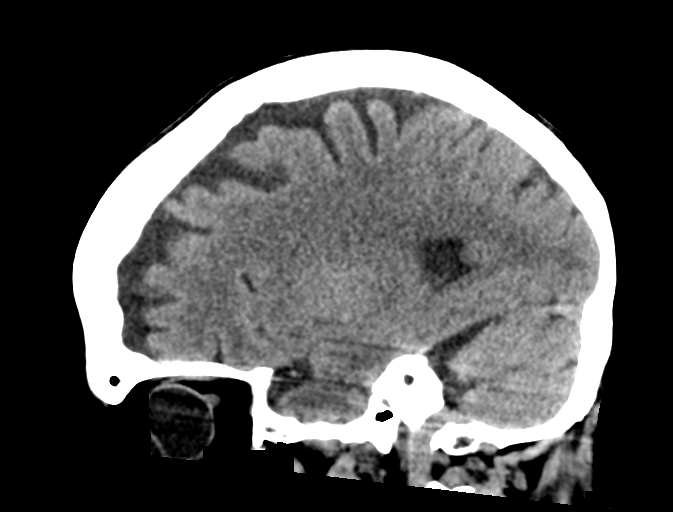

[15 of 47 positions shown; findings below may reference images not displayed]

FINDINGS: CT HEAD FINDINGS

Brain: No evidence of acute infarction, hemorrhage, hydrocephalus,
extra-axial collection or mass lesion/mass effect.

Vascular: No hyperdense vessel or unexpected calcification.

Skull: Normal. Negative for fracture or focal lesion.

Other: None.

CT MAXILLOFACIAL FINDINGS

Osseous: Chronic nasal bone deformity consistent with remote
fracture. Bony orbits, zygomatic arches, pterygoid plates, and
mandible are intact.

Orbits: Negative. No traumatic or inflammatory finding.

Sinuses: There has been prior left uncinectomy.

Sinuses and mastoid air cells are well aerated.

Soft tissues: Right facial soft tissue swelling is seen. Metallic bb
noted overlying the right zygomatic arch.

CT CERVICAL SPINE FINDINGS

Alignment: Normal.

Skull base and vertebrae: Minimally displaced fracture of the T2
spinous process best seen on image 87 of series 6.

Soft tissues and spinal canal: No prevertebral fluid or swelling. No
visible canal hematoma.

Disc levels:  Mild multilevel degenerative changes are seen.

Upper chest: Negative.

Other: None.
IMPRESSION: 1. Minimally displaced fracture of the T2 spinous process.
2. No acute abnormality of the head or facial bones.

## 2023-01-31 IMAGING — CR DG RIBS W/ CHEST 3+V*R*
4 series · 4 of 4 positions shown · non-contrast
Comparison: Radiograph 10/02/2020

CLINICAL DATA: Fall today after tripping on paper. Multiple
additional falls since that time. Right rib pain.

EXAM:
RIGHT RIBS AND CHEST - 3+ VIEW

[x chest ap (1 of 4)]
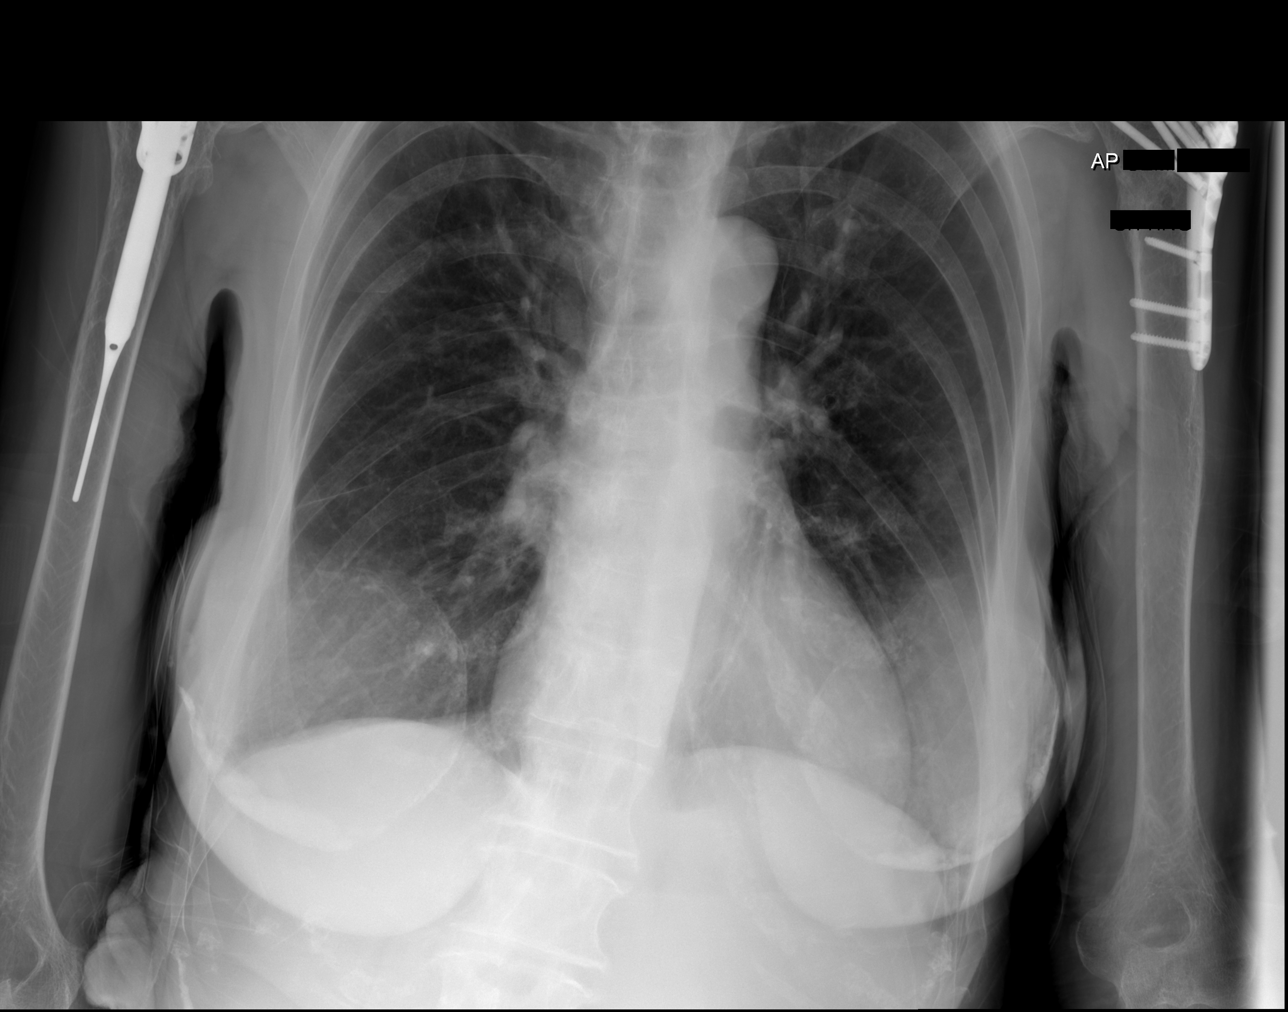

[x chest ap (2 of 4)]
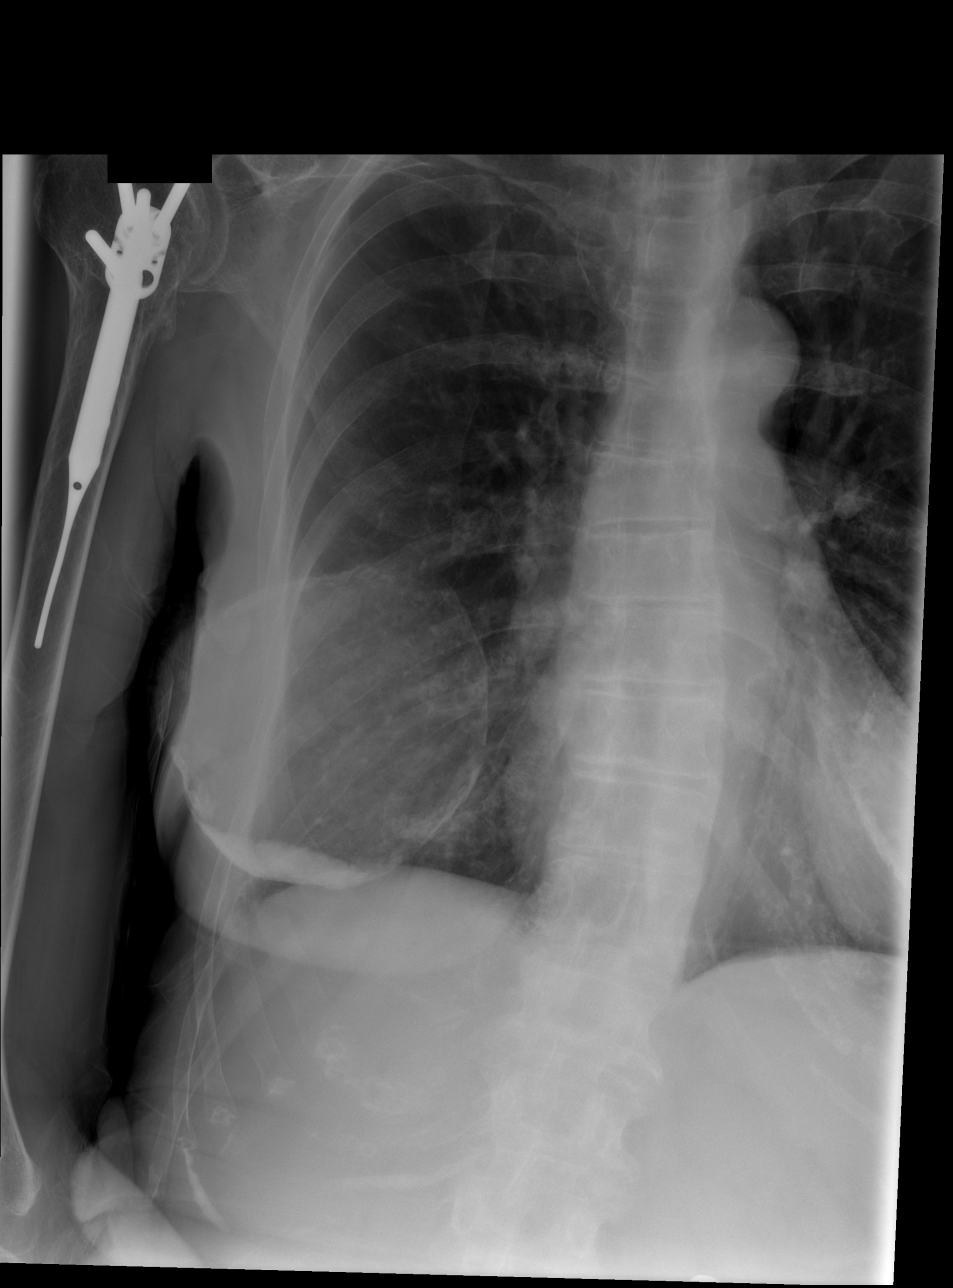

[x chest ap (3 of 4)]
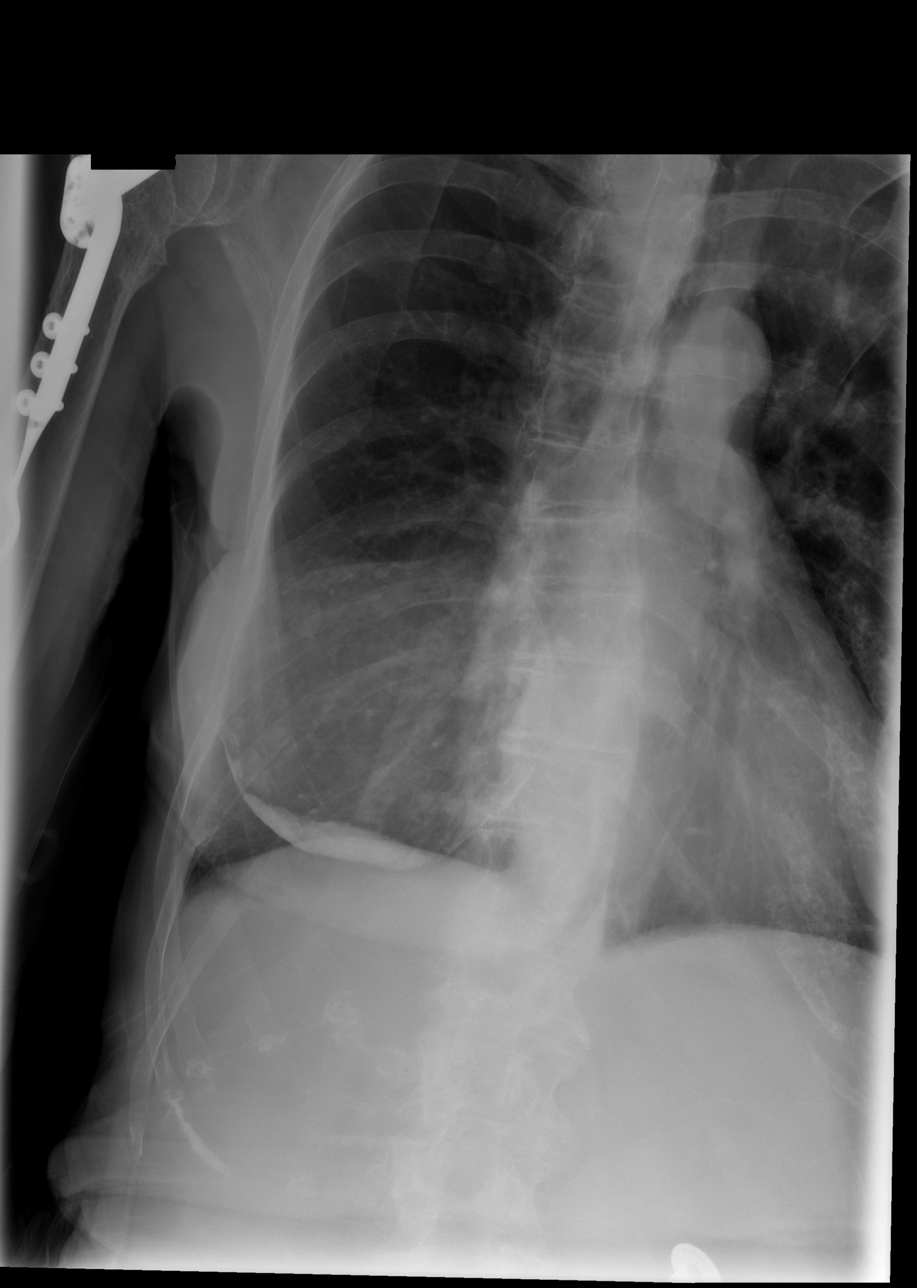

[x chest ap (4 of 4)]
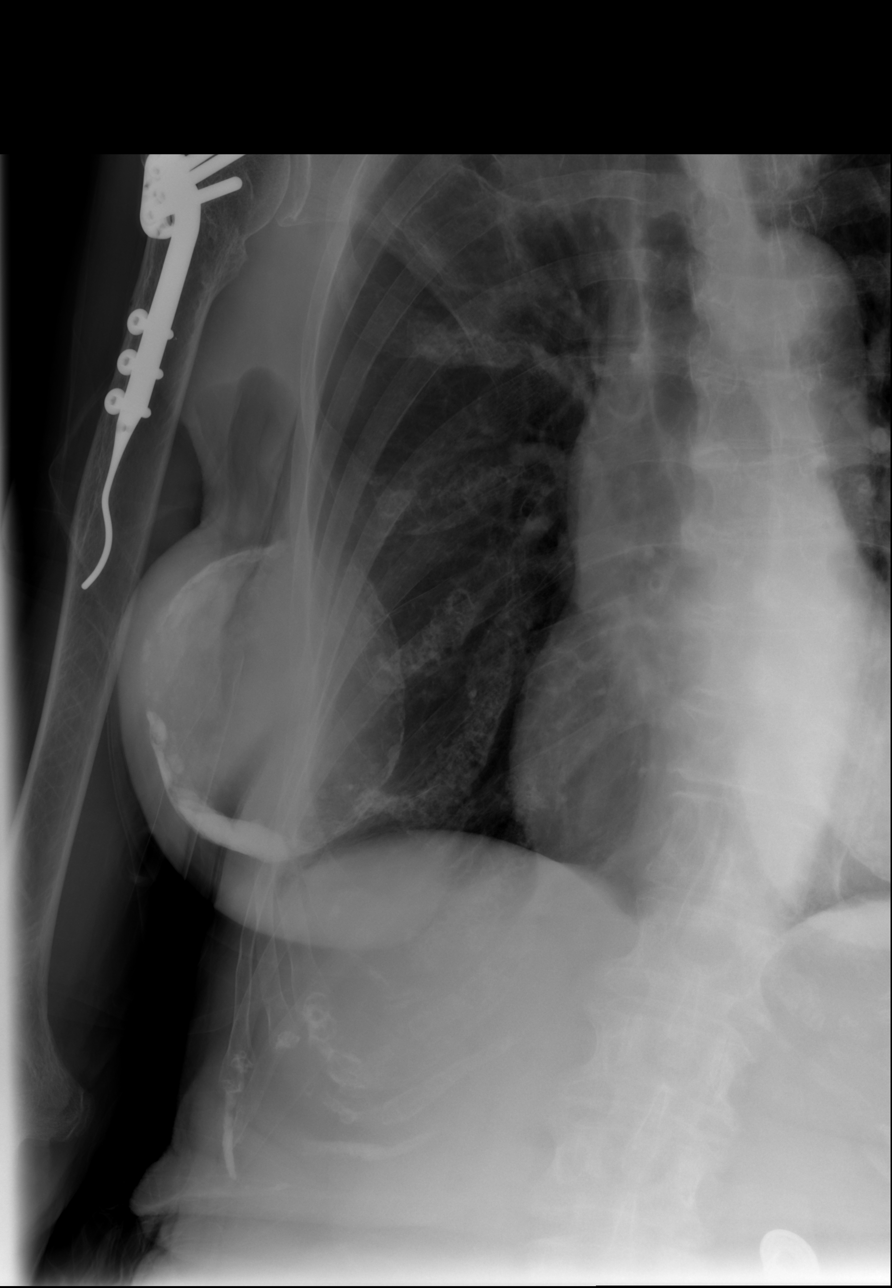

[4 of 4 positions shown; findings below may reference images not displayed]

FINDINGS: No fracture or other bone lesions are seen involving the ribs. The
bones are diffusely under mineralized. Postsurgical change of both
proximal humeral. There is no evidence of pneumothorax or pleural
effusion. Both lungs are clear. Heart size and mediastinal contours
are within normal limits. Peripherally calcified breast implants.
IMPRESSION: No evidence of rib fracture or acute chest finding.

## 2023-01-31 IMAGING — CR DG SHOULDER 2+V*R*
2 series · 2 of 2 positions shown · non-contrast
Comparison: None.

CLINICAL DATA: Status post fall.

EXAM:
RIGHT SHOULDER - 2+ VIEW

[t shoulder internal right]
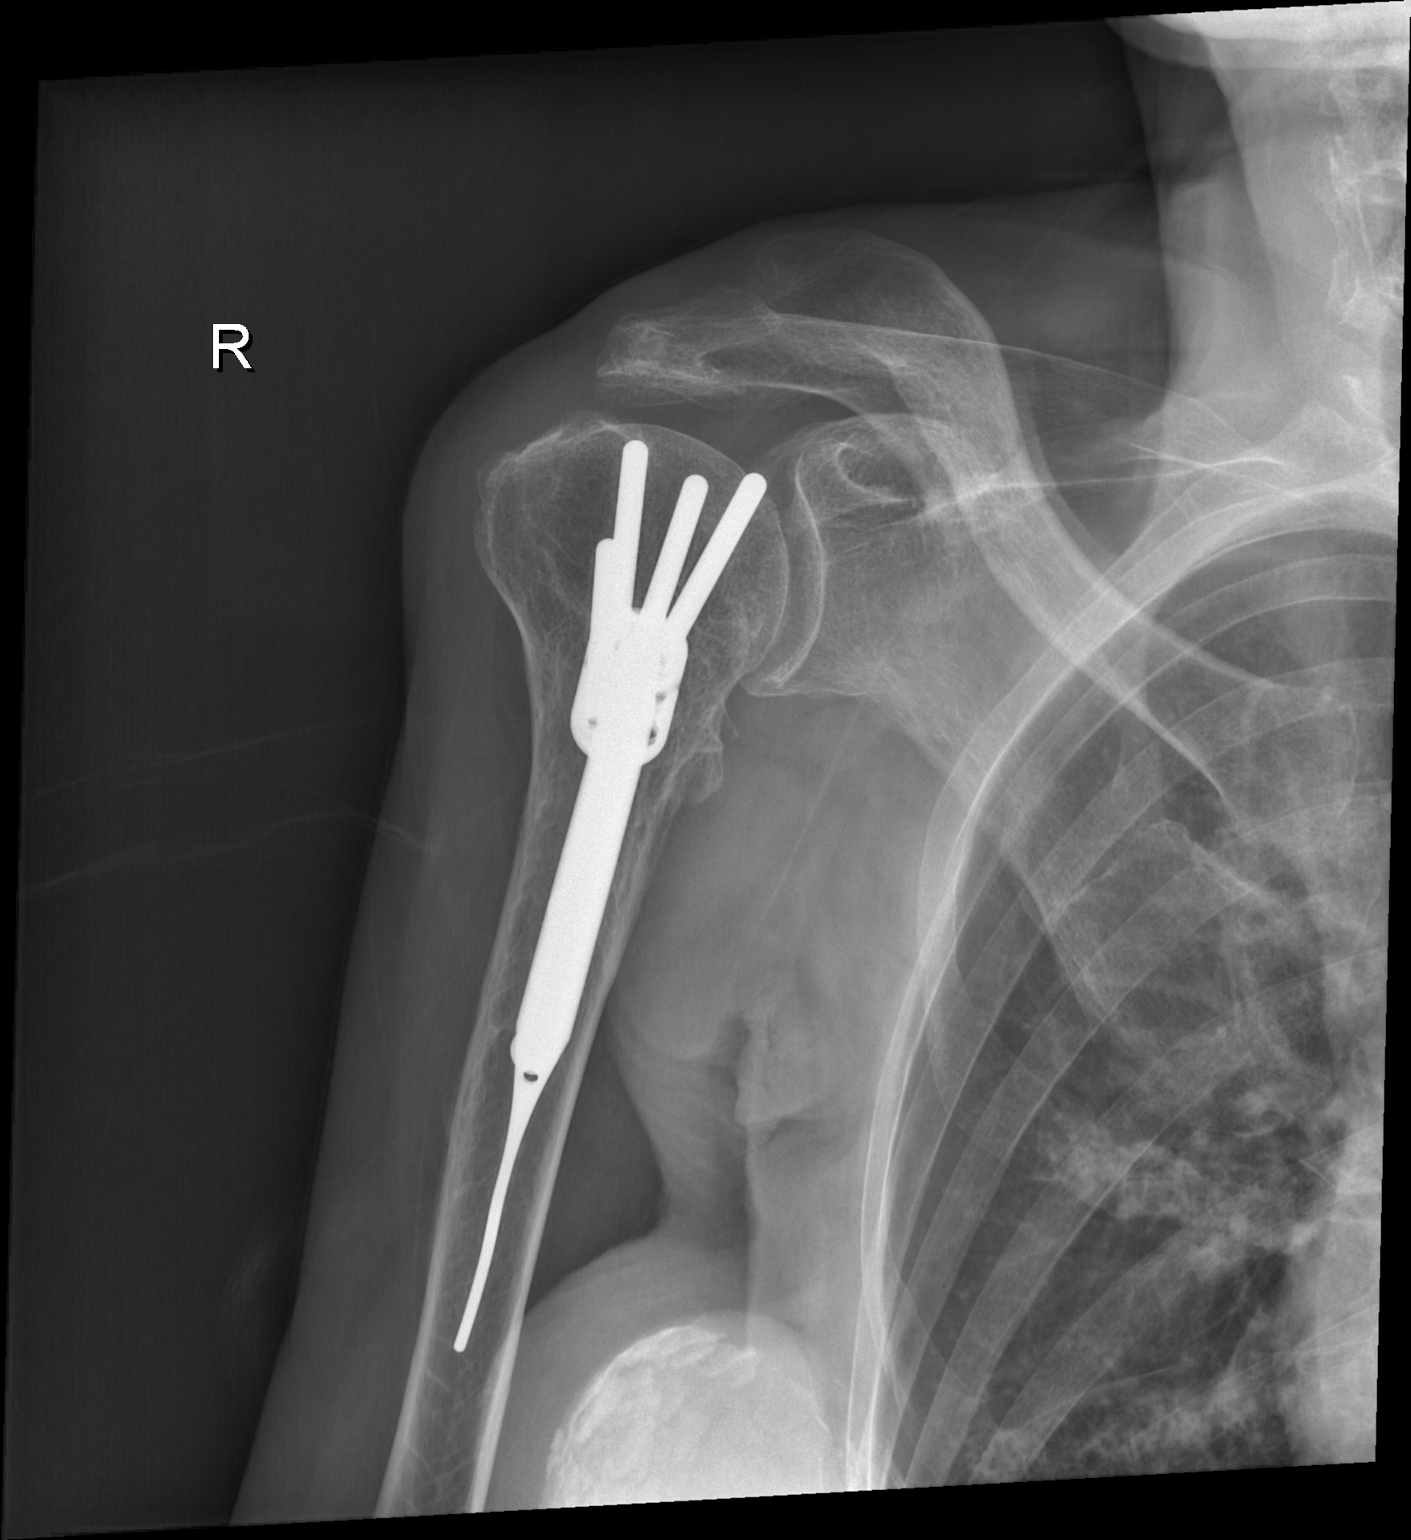

[t shoulder y-view right]
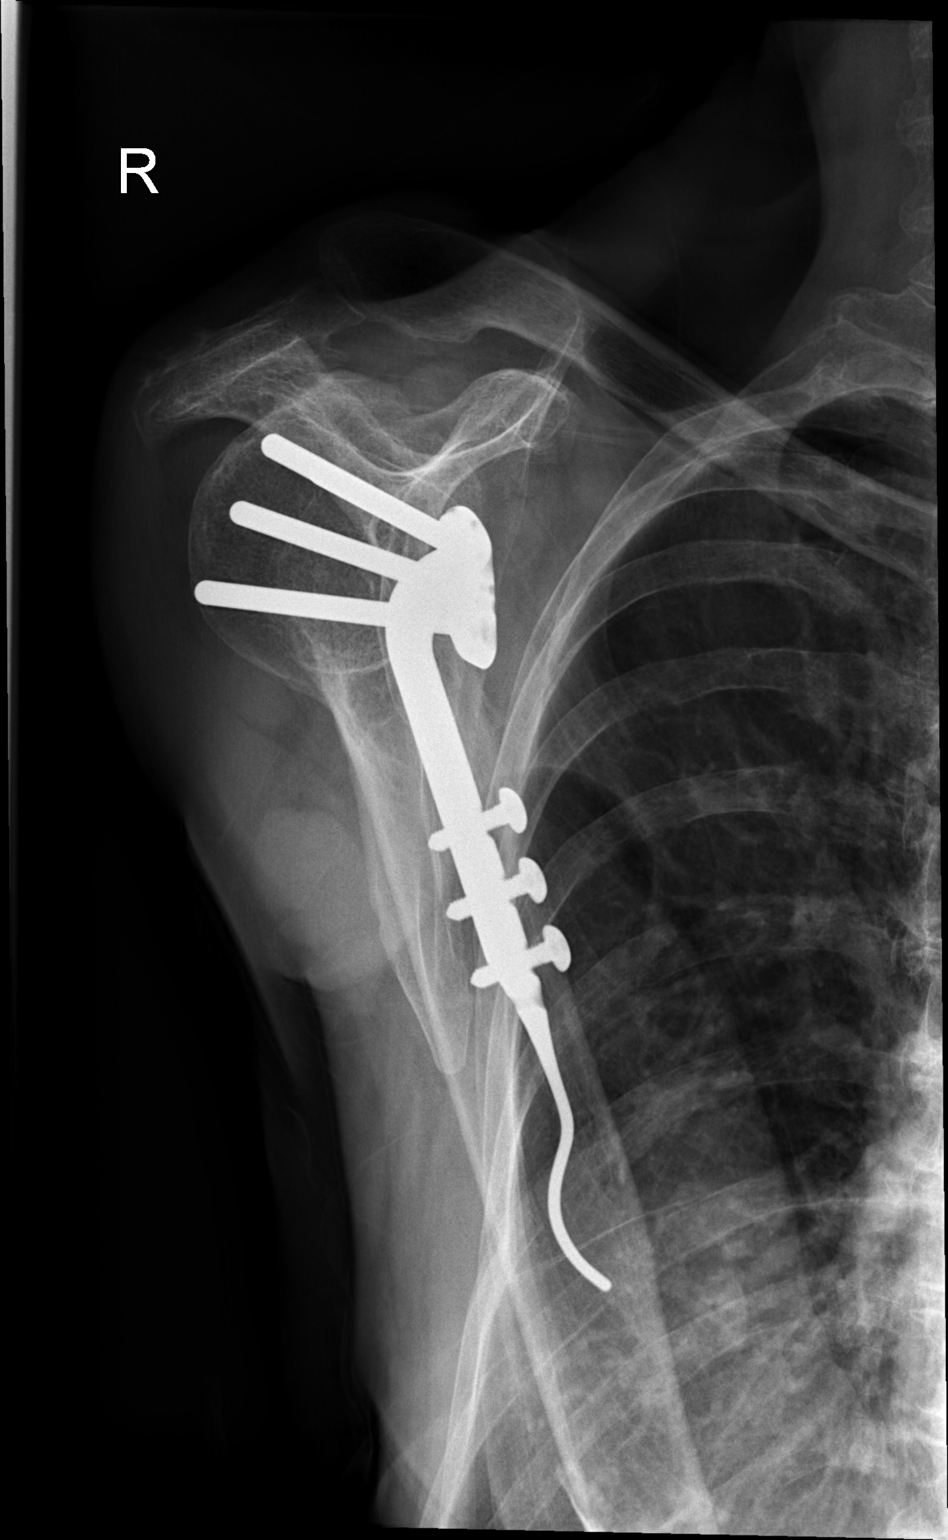

[2 of 2 positions shown; findings below may reference images not displayed]

FINDINGS: There is no evidence of an acute fracture or dislocation. A
radiopaque fixation plate and screws are seen within the proximal
right humerus. Degenerative changes seen involving the right
acromioclavicular joint and right glenohumeral articulation. A
partially calcified right breast implant is noted.
IMPRESSION: Chronic and degenerative changes without an acute osseous
abnormality.

## 2023-02-02 ENCOUNTER — Ambulatory Visit: Payer: No Typology Code available for payment source | Attending: Family Medicine | Admitting: Physical Therapy

## 2023-02-10 IMAGING — CR DG RIBS W/ CHEST 3+V*R*
3 series · 3 of 3 positions shown · non-contrast
Comparison: Chest radiograph January 02, 2021

CLINICAL DATA: Rib pain post fall.

EXAM:
RIGHT RIBS AND CHEST - 3+ VIEW

[chest pa]
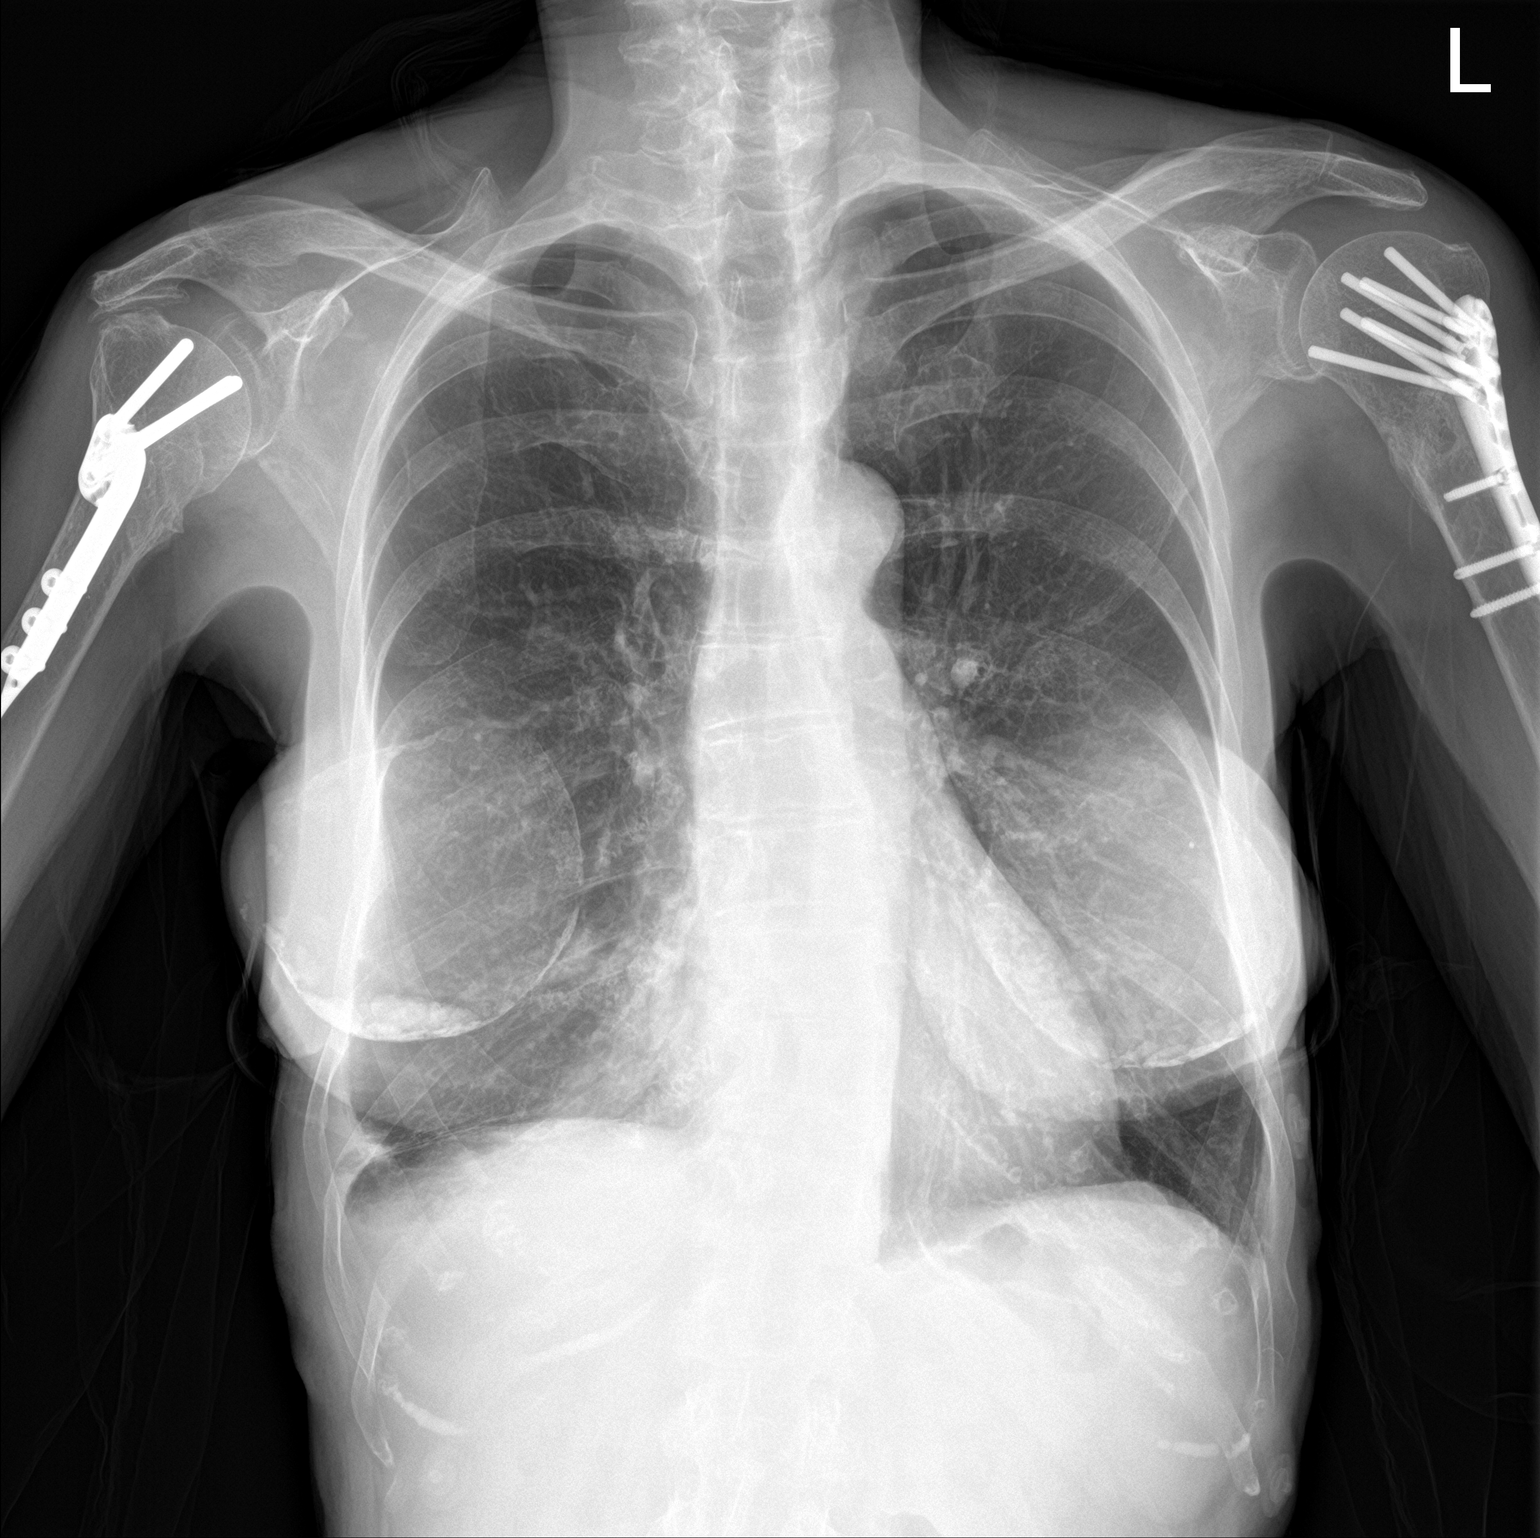

[rib pa]
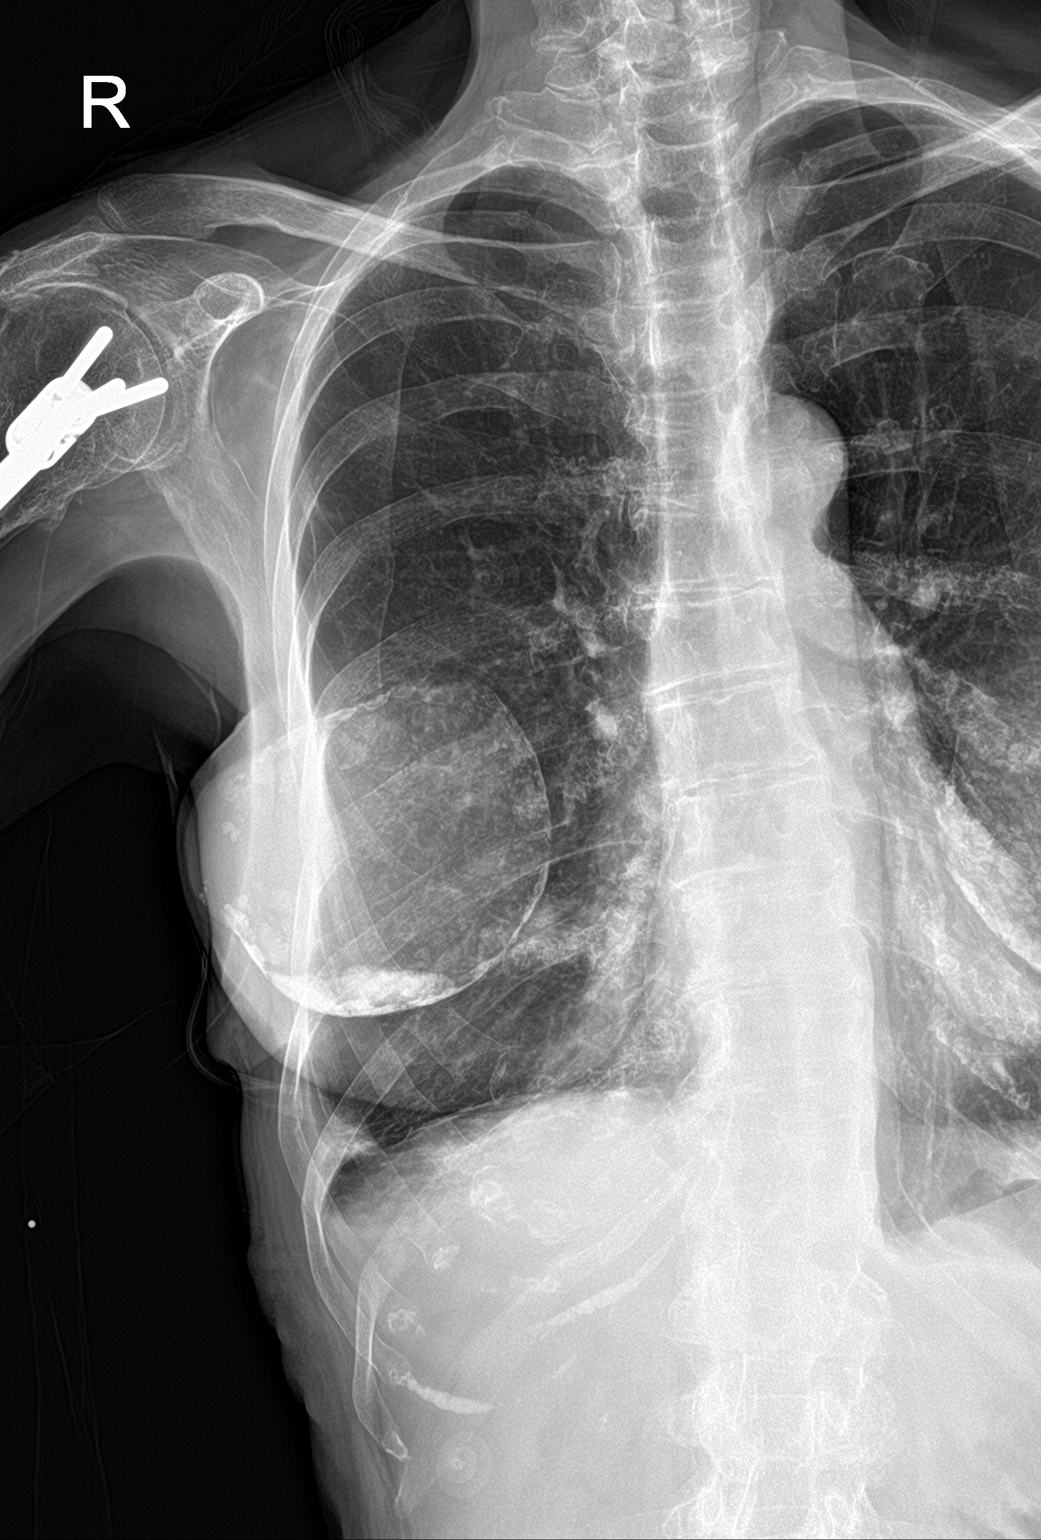

[rib pa obl]
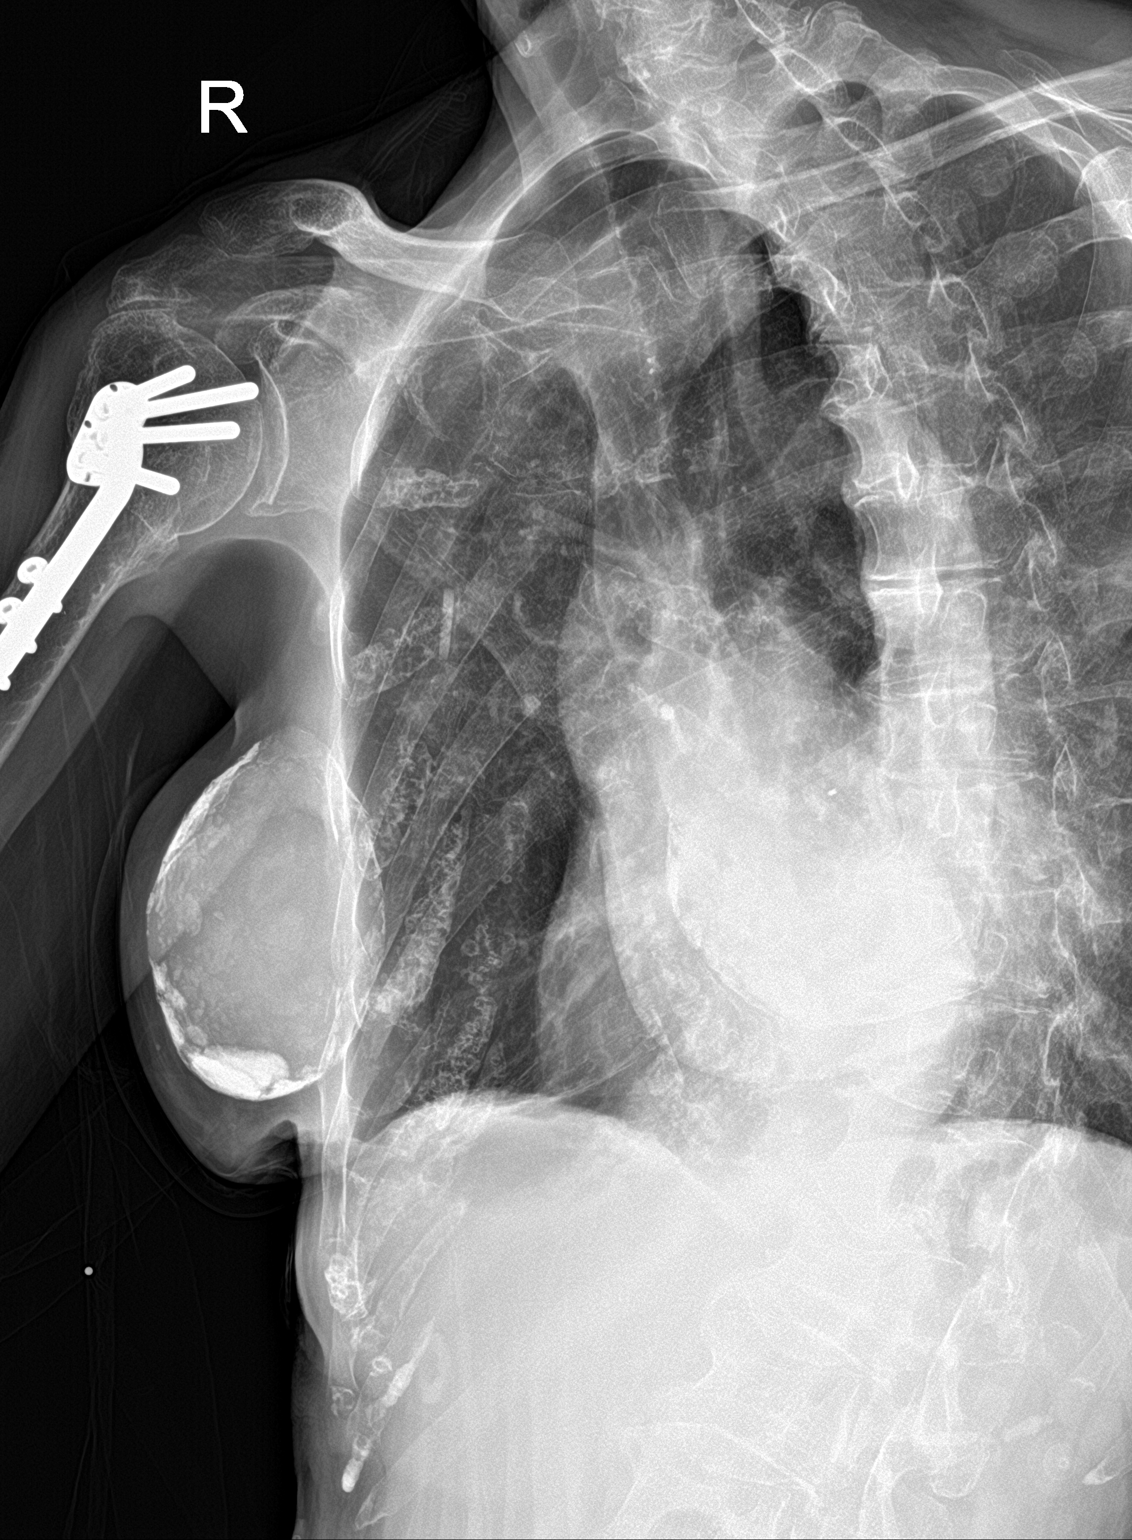

[3 of 3 positions shown; findings below may reference images not displayed]

FINDINGS: Minimally displaced fractures of the right eighth and ninth
posterolateral ribs. No evidence of pneumothorax. Mild streaky
opacities in the right lung base may represent atelectasis.

No evidence of lobar consolidation.
IMPRESSION: 1. Minimally displaced fractures of the right eighth and ninth
posterolateral ribs.
2. No evidence of pneumothorax.
3. Mild streaky opacities in the right lung base may represent
atelectasis.

## 2023-02-10 IMAGING — CT CT HEAD W/O CM
4 series · 15 of 47 positions shown, 17 images · non-contrast
Comparison: CT head/maxillofacial 01/02/2021. prior head CT
12/24/2020.

CLINICAL DATA: Fall, head/face injury.

EXAM:
CT HEAD WITHOUT CONTRAST
CT MAXILLOFACIAL WITHOUT CONTRAST
TECHNIQUE: Multidetector CT imaging of the head and maxillofacial structures
were performed using the standard protocol without intravenous
contrast. Multiplanar CT image reconstructions of the maxillofacial
structures were also generated.

[Series 3: head wo · axial · 0.41mm/px · z∈[-73,+47]mm · 7 of 32 slices shown, 9 images]
[im 4/32  brain]
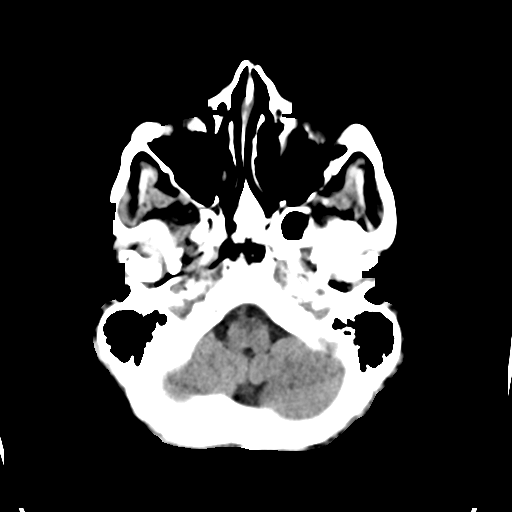
[im 4/32  bone]
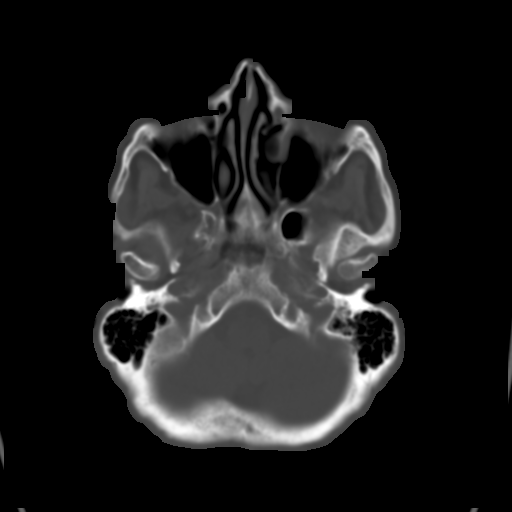
[im 8/32  brain]
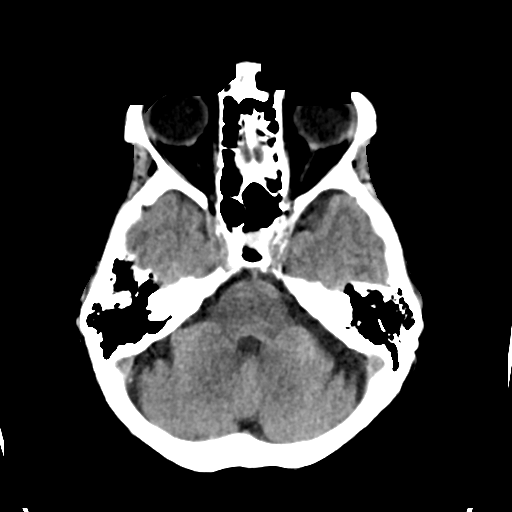
[im 12/32  brain]
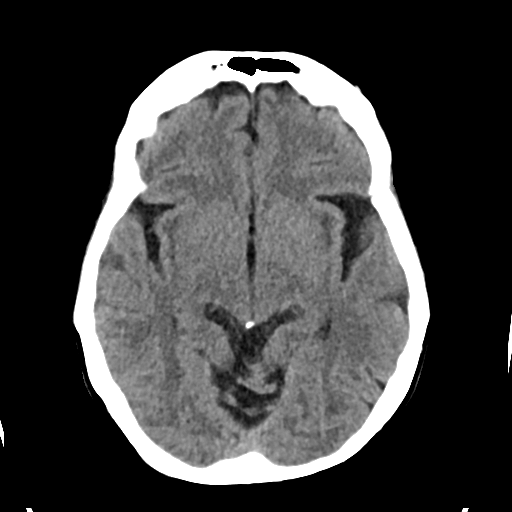
[im 16/32  brain]
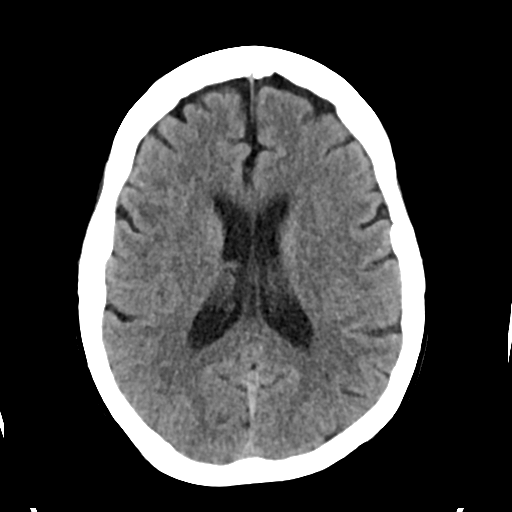
[im 20/32  brain]
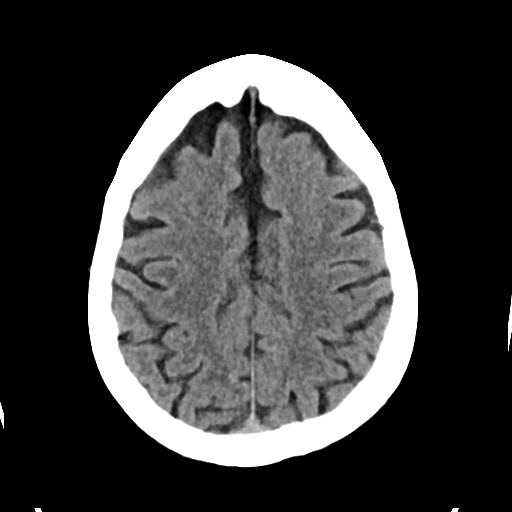
[im 20/32  bone]
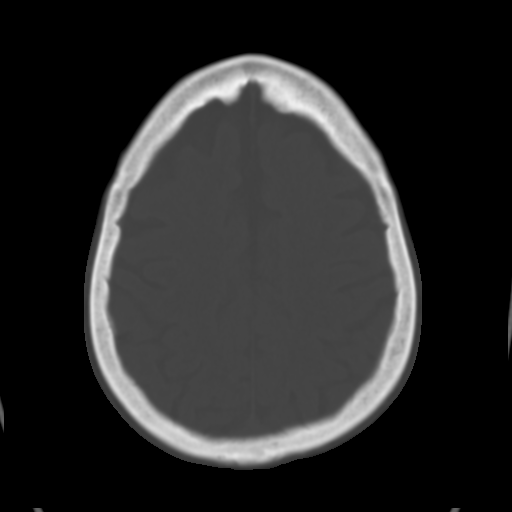
[im 24/32  brain]
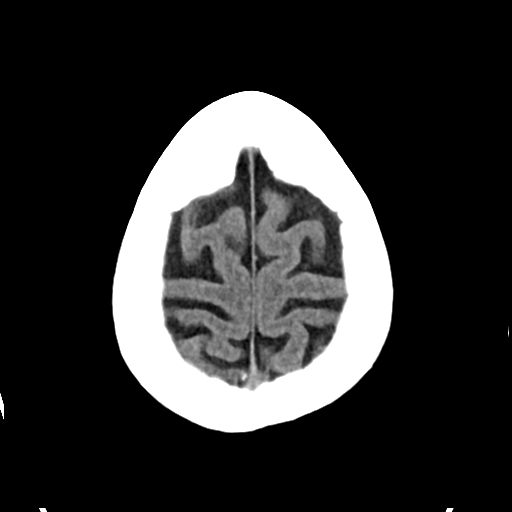
[im 28/32  brain]
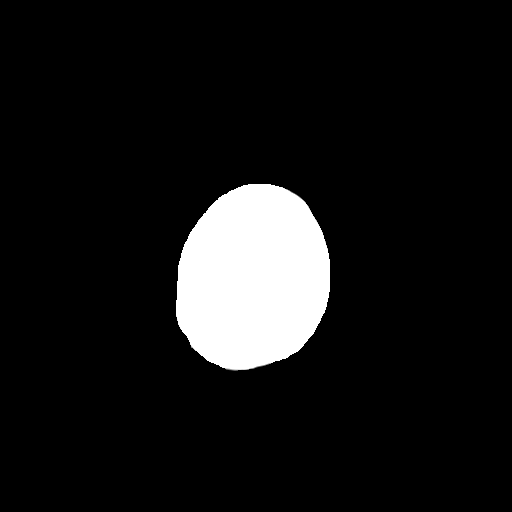

[Series 4: head bone · axial · 0.41mm/px · z∈[-74,-58]mm · 2 of 79 slices shown]
[im 8/79  bone]
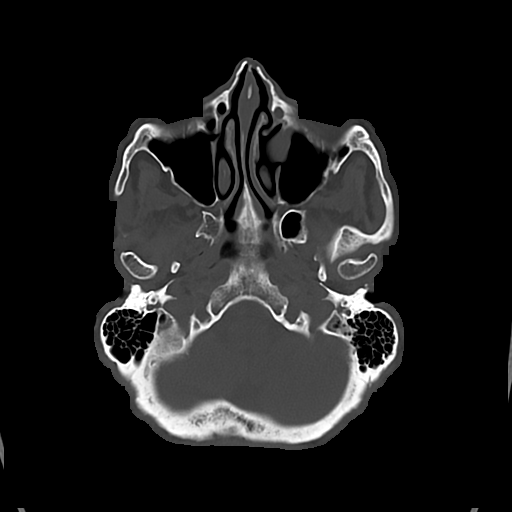
[im 16/79  bone]
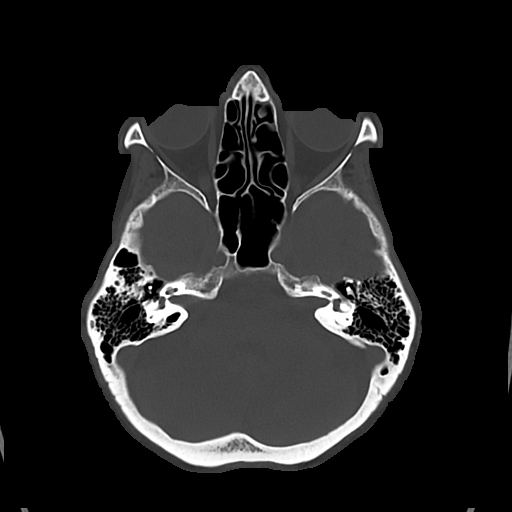

[Series 5: cor soft · coronal · 0.29mm/px · 3 of 69 slices shown]
[im 23/69  brain]
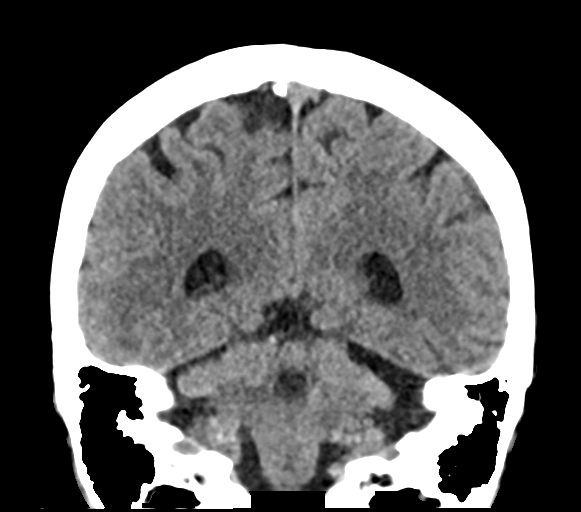
[im 31/69  brain]
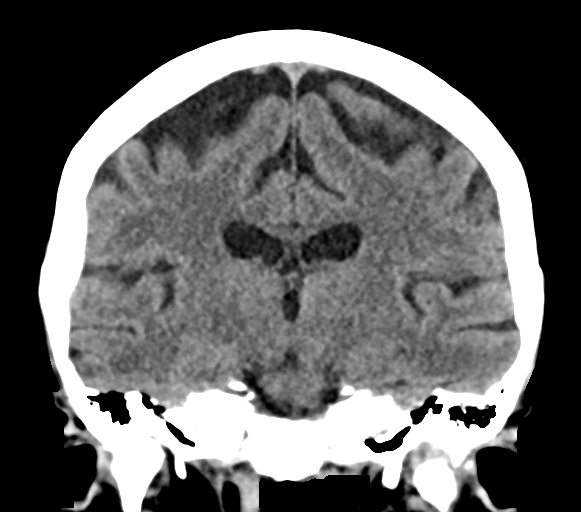
[im 38/69  brain]
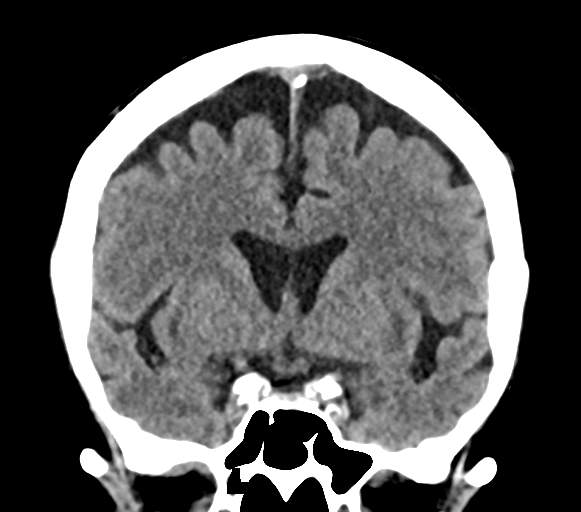

[Series 6: sag soft · sagittal · 0.29mm/px · 3 of 52 slices shown]
[im 18/52  brain]
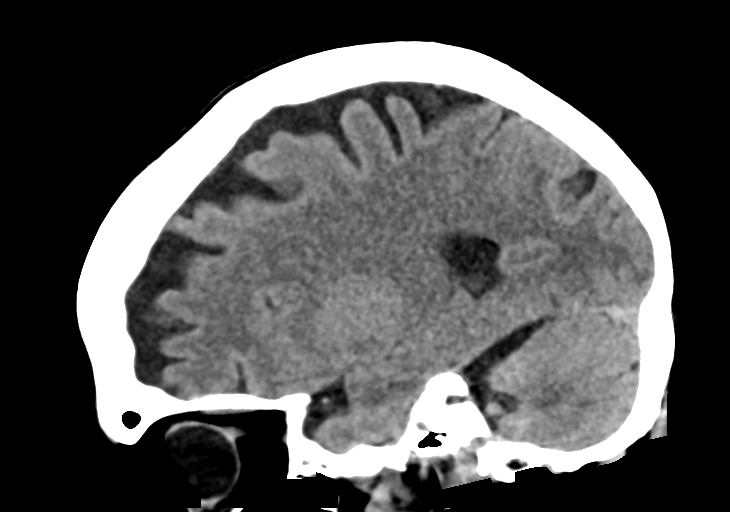
[im 26/52  brain]
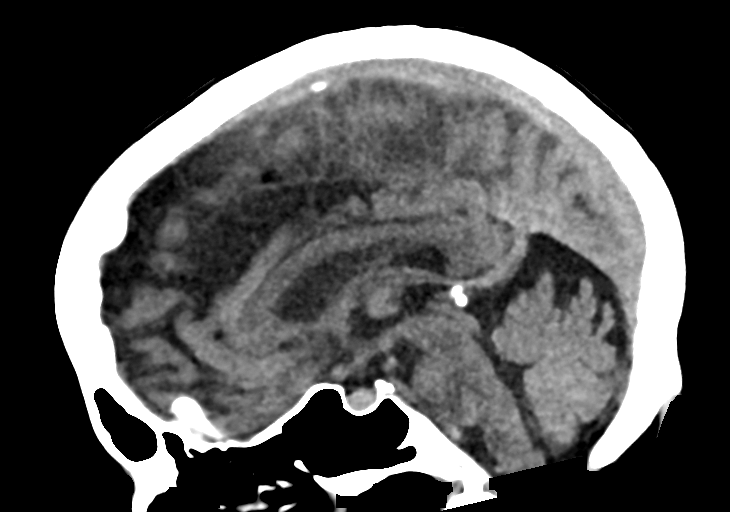
[im 35/52  brain]
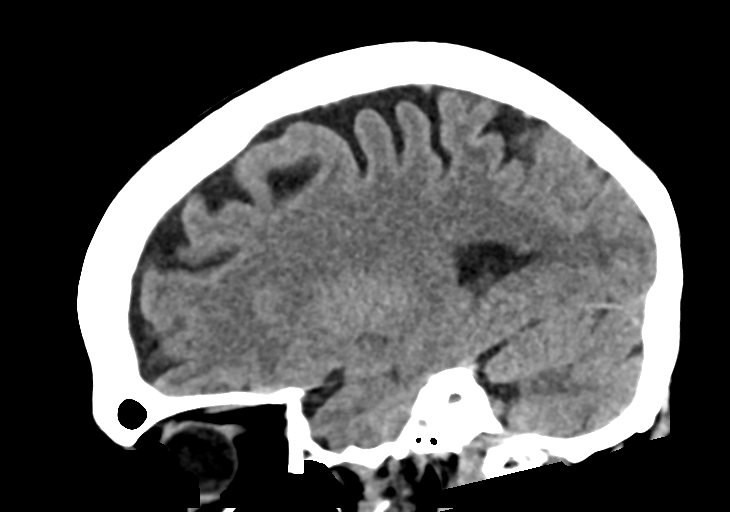

[15 of 47 positions shown; findings below may reference images not displayed]

FINDINGS: CT HEAD FINDINGS

Brain:

Mild generalized cerebral and cerebellar atrophy.

There is no acute intracranial hemorrhage.

No demarcated cortical infarct.

No extra-axial fluid collection.

No evidence of intracranial mass.

No midline shift.

Vascular: No hyperdense vessel.  Atherosclerotic calcifications.

Skull: Normal. Negative for fracture or focal lesion.

Other: No significant mastoid effusion at the imaged levels.

CT MAXILLOFACIAL FINDINGS

Osseous: Redemonstrated chronic fracture deformities of the
bilateral nasal bones. No acute maxillofacial fracture is
identified.

Orbits: No acute finding within the orbits. The globes are normal in
size and contour. The extraocular muscles and optic nerve sheath
complexes are symmetric and unremarkable.

Sinuses: Mild bilateral ethmoid and maxillary sinus mucosal
thickening.

Soft tissues: Right periorbital/maxillofacial soft tissue swelling.
IMPRESSION: CT head:

1. No evidence of acute intracranial abnormality.
2. Mild generalized parenchymal atrophy.

CT maxillofacial:

1. No evidence of acute maxillofacial fracture.
2. Redemonstrated chronic fracture deformities of the bilateral
nasal bones.
3. Right periorbital/maxillofacial soft tissue swelling.
4. Mild bilateral ethmoid and maxillary sinus mucosal thickening.

## 2023-02-10 IMAGING — CT CT MAXILLOFACIAL W/O CM
3 of 8 series · 14 of 47 positions shown, 17 images · non-contrast
Comparison: CT head/maxillofacial 01/02/2021. prior head CT
12/24/2020.

CLINICAL DATA: Fall, head/face injury.

EXAM:
CT HEAD WITHOUT CONTRAST
CT MAXILLOFACIAL WITHOUT CONTRAST
TECHNIQUE: Multidetector CT imaging of the head and maxillofacial structures
were performed using the standard protocol without intravenous
contrast. Multiplanar CT image reconstructions of the maxillofacial
structures were also generated.

[Series 4: st thins · axial · 0.36mm/px · z∈[-157,-38]mm · 9 of 214 slices shown, 12 images]
[im 22/214  brain]
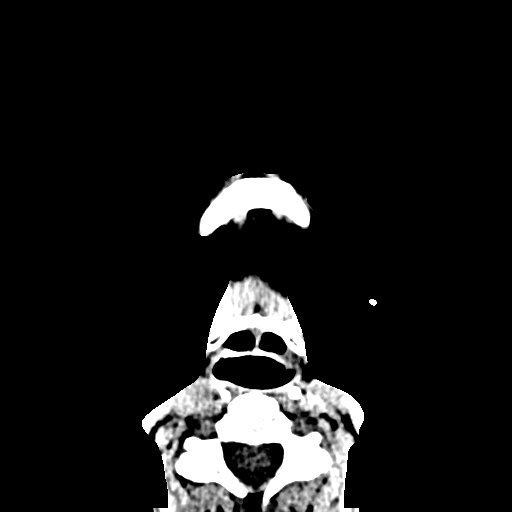
[im 22/214  bone]
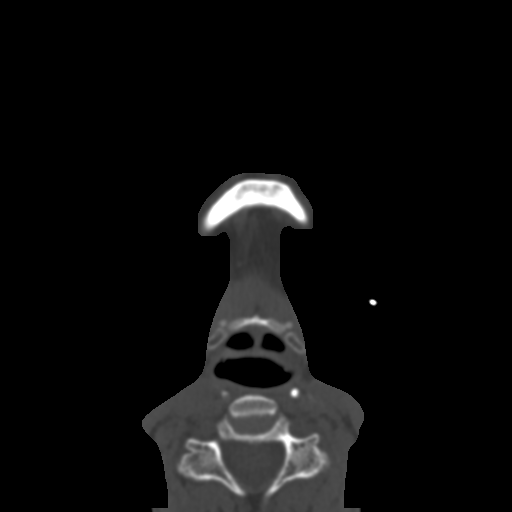
[im 43/214  bone]
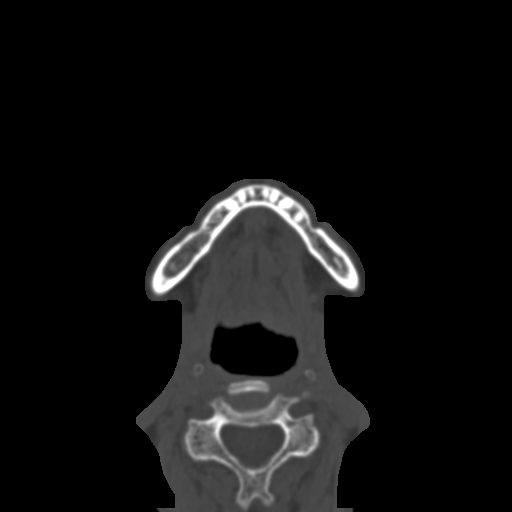
[im 64/214  bone]
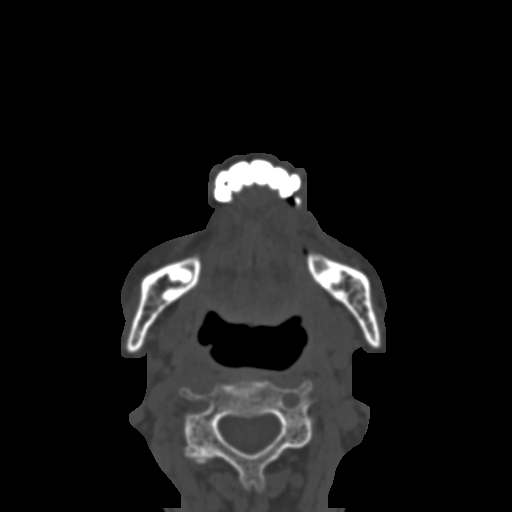
[im 86/214  bone]
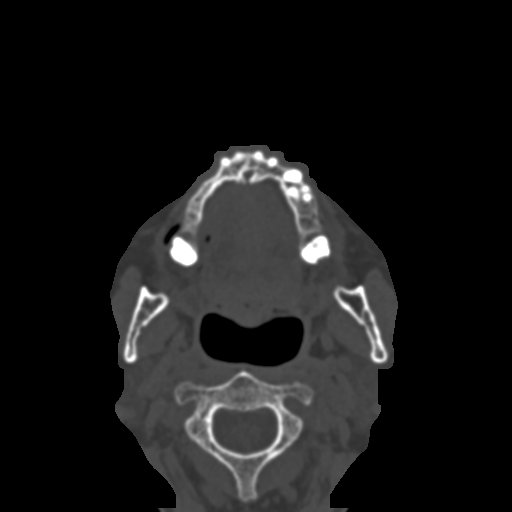
[im 107/214  brain]
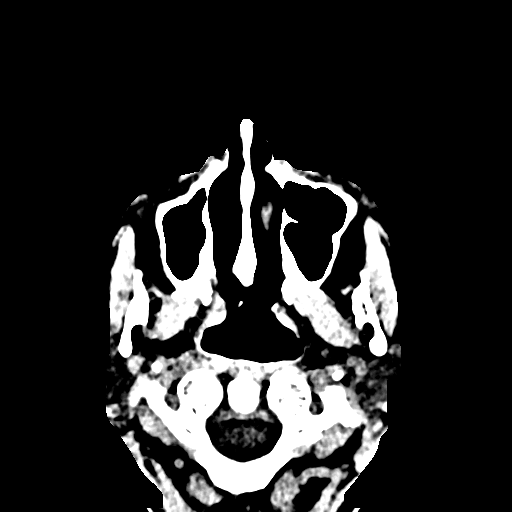
[im 107/214  bone]
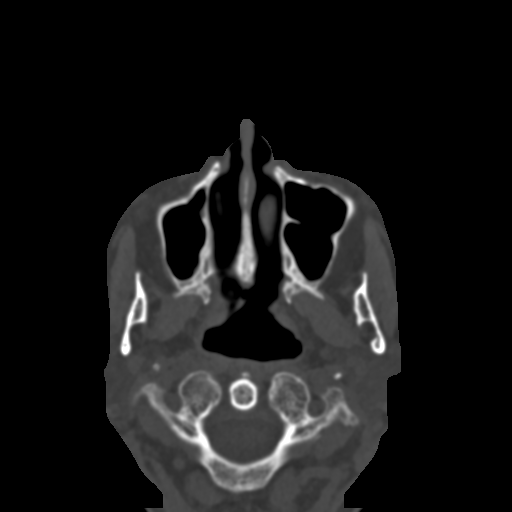
[im 128/214  bone]
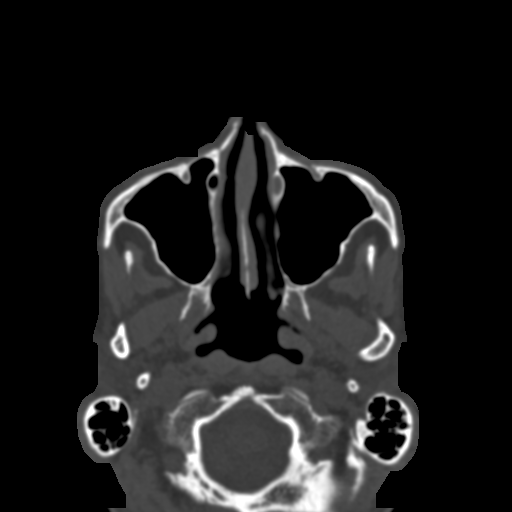
[im 150/214  bone]
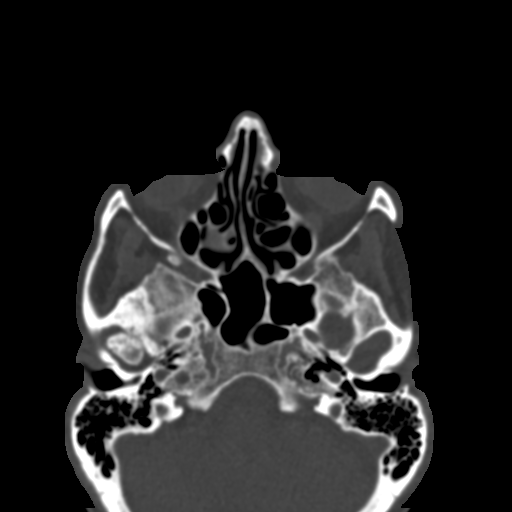
[im 171/214  bone]
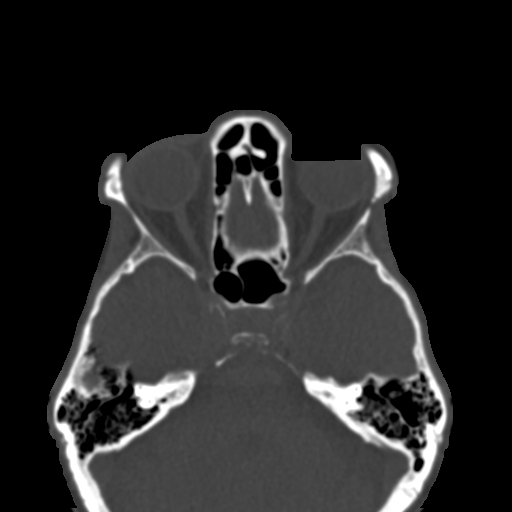
[im 192/214  brain]
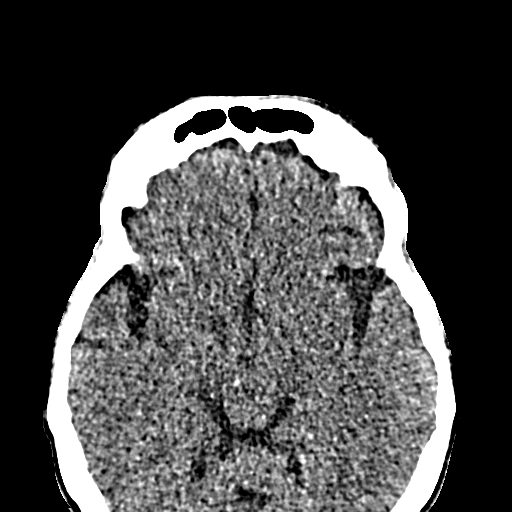
[im 192/214  bone]
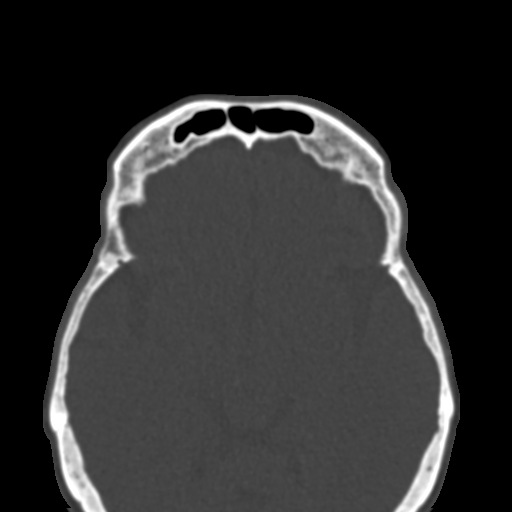

[Series 9: bone cor · coronal · 0.29mm/px · 3 of 104 slices shown]
[im 26/104  bone]
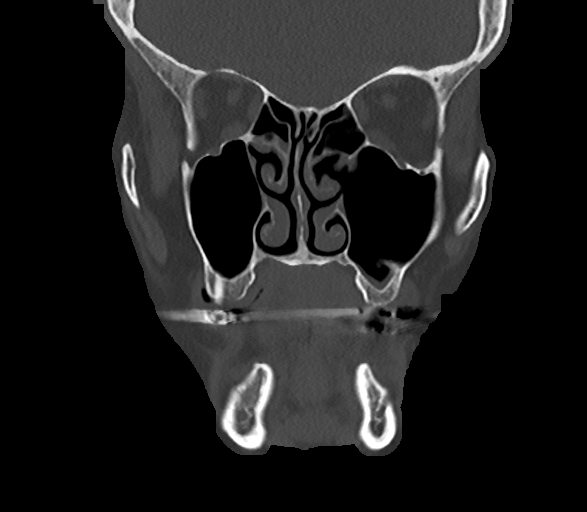
[im 52/104  bone]
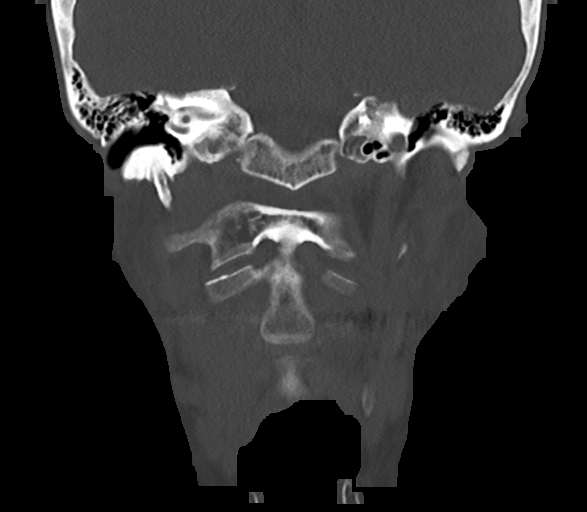
[im 78/104  bone]
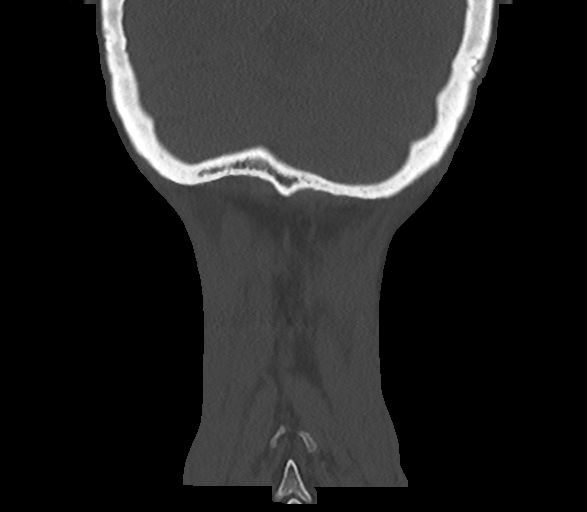

[Series 10: bone sag · sagittal · 0.29mm/px · 2 of 80 slices shown]
[im 27/80  bone]
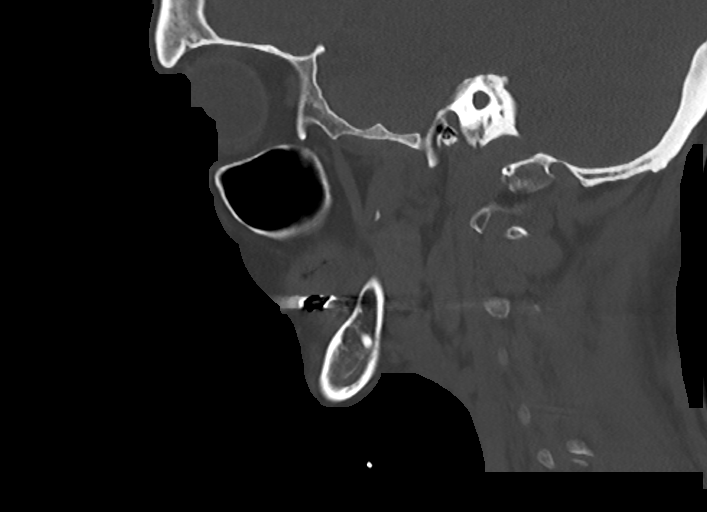
[im 53/80  bone]
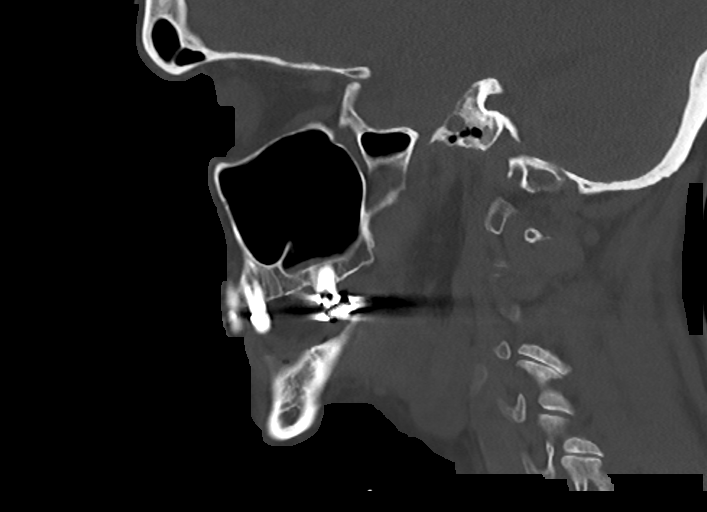

[14 of 47 positions shown; findings below may reference images not displayed]

FINDINGS: CT HEAD FINDINGS

Brain:

Mild generalized cerebral and cerebellar atrophy.

There is no acute intracranial hemorrhage.

No demarcated cortical infarct.

No extra-axial fluid collection.

No evidence of intracranial mass.

No midline shift.

Vascular: No hyperdense vessel.  Atherosclerotic calcifications.

Skull: Normal. Negative for fracture or focal lesion.

Other: No significant mastoid effusion at the imaged levels.

CT MAXILLOFACIAL FINDINGS

Osseous: Redemonstrated chronic fracture deformities of the
bilateral nasal bones. No acute maxillofacial fracture is
identified.

Orbits: No acute finding within the orbits. The globes are normal in
size and contour. The extraocular muscles and optic nerve sheath
complexes are symmetric and unremarkable.

Sinuses: Mild bilateral ethmoid and maxillary sinus mucosal
thickening.

Soft tissues: Right periorbital/maxillofacial soft tissue swelling.
IMPRESSION: CT head:

1. No evidence of acute intracranial abnormality.
2. Mild generalized parenchymal atrophy.

CT maxillofacial:

1. No evidence of acute maxillofacial fracture.
2. Redemonstrated chronic fracture deformities of the bilateral
nasal bones.
3. Right periorbital/maxillofacial soft tissue swelling.
4. Mild bilateral ethmoid and maxillary sinus mucosal thickening.

## 2023-02-18 DIAGNOSIS — I10 Essential (primary) hypertension: Secondary | ICD-10-CM | POA: Diagnosis not present

## 2023-02-18 DIAGNOSIS — F17200 Nicotine dependence, unspecified, uncomplicated: Secondary | ICD-10-CM | POA: Diagnosis not present

## 2023-02-18 DIAGNOSIS — G43909 Migraine, unspecified, not intractable, without status migrainosus: Secondary | ICD-10-CM | POA: Diagnosis not present

## 2023-02-25 DIAGNOSIS — F332 Major depressive disorder, recurrent severe without psychotic features: Secondary | ICD-10-CM | POA: Diagnosis not present

## 2023-03-18 ENCOUNTER — Ambulatory Visit
Admission: RE | Admit: 2023-03-18 | Discharge: 2023-03-18 | Disposition: A | Discharge status: Home / Self Care | Payer: No Typology Code available for payment source | Source: Ambulatory Visit | Attending: Family Medicine | Admitting: Family Medicine

## 2023-03-18 DIAGNOSIS — N6310 Unspecified lump in the right breast, unspecified quadrant: Secondary | ICD-10-CM

## 2023-03-18 DIAGNOSIS — R92333 Mammographic heterogeneous density, bilateral breasts: Secondary | ICD-10-CM | POA: Diagnosis not present

## 2023-03-18 DIAGNOSIS — N6322 Unspecified lump in the left breast, upper inner quadrant: Secondary | ICD-10-CM | POA: Diagnosis not present

## 2023-03-19 DIAGNOSIS — M5416 Radiculopathy, lumbar region: Secondary | ICD-10-CM | POA: Diagnosis not present

## 2023-03-19 DIAGNOSIS — M47896 Other spondylosis, lumbar region: Secondary | ICD-10-CM | POA: Diagnosis not present

## 2023-03-19 DIAGNOSIS — G894 Chronic pain syndrome: Secondary | ICD-10-CM | POA: Diagnosis not present

## 2023-03-26 DIAGNOSIS — I83891 Varicose veins of right lower extremities with other complications: Secondary | ICD-10-CM | POA: Diagnosis not present

## 2023-05-11 DIAGNOSIS — S52502A Unspecified fracture of the lower end of left radius, initial encounter for closed fracture: Secondary | ICD-10-CM | POA: Diagnosis not present

## 2023-05-15 DIAGNOSIS — S52502A Unspecified fracture of the lower end of left radius, initial encounter for closed fracture: Secondary | ICD-10-CM | POA: Diagnosis not present

## 2023-05-18 DIAGNOSIS — X58XXXA Exposure to other specified factors, initial encounter: Secondary | ICD-10-CM | POA: Diagnosis not present

## 2023-05-18 DIAGNOSIS — Y999 Unspecified external cause status: Secondary | ICD-10-CM | POA: Diagnosis not present

## 2023-05-18 DIAGNOSIS — S52572A Other intraarticular fracture of lower end of left radius, initial encounter for closed fracture: Secondary | ICD-10-CM | POA: Diagnosis not present

## 2023-05-18 DIAGNOSIS — S52571A Other intraarticular fracture of lower end of right radius, initial encounter for closed fracture: Secondary | ICD-10-CM | POA: Diagnosis not present

## 2023-06-02 DIAGNOSIS — S52502D Unspecified fracture of the lower end of left radius, subsequent encounter for closed fracture with routine healing: Secondary | ICD-10-CM | POA: Diagnosis not present

## 2023-06-16 DIAGNOSIS — M25532 Pain in left wrist: Secondary | ICD-10-CM | POA: Diagnosis not present

## 2023-06-16 DIAGNOSIS — S52502D Unspecified fracture of the lower end of left radius, subsequent encounter for closed fracture with routine healing: Secondary | ICD-10-CM | POA: Diagnosis not present

## 2023-06-20 NOTE — Progress Notes (Deleted)
Cardiology Office Note:  .   Date:  06/20/2023  ID:  Dorothy White, DOB 1951/01/06, MRN [id number] PCP: Camie Patience, FNP  [facility] Providers Cardiologist:  Orbie Pyo, MD { Click to update primary MD,subspecialty MD or APP then REFRESH:1}   Patient Profile: .      PMH Tobacco abuse Carotid artery disease Mild stenosis of RICA Moderate stenosis LICA Coronary artery disease Cardiac catheterization 2017 with mild mid LAD disease Aortic atherosclerosis Hyperlipidemia Elevated lipoprotein a Palpitations Dyspnea Bilateral atelectasis  Seen in 2017 by Dr. Delton See for palpitations and reduced EF noted to be 40 to 45%.  She wore a 30-day event monitor that showed no atrial fibrillation or arrhythmias.  She was seen in follow-up 01/2016 with orthostatic hypotension, SOB, and substernal chest pain.  She underwent nuclear stress test that showed medium defect of moderate severity with T wave inversion in inferior lateral leads.  She had LHC that revealed nonobstructive CAD and EF 55 to 65%.    Seen in 2018 for chest pain and dyspnea on exertion.  She endorsed twinge in her chest and was noted to have abnormal EKG with nonspecific ST changes and subtle sagging in lead II, 3, aVF and flattened T waves in V4 through V6.  Troponins were negative.  Carotid ultrasound revealed 40 to 59% stenosis in distal left internal carotid artery with 1 to 39% stenosis in right carotid artery.  2D echo noted EF 60 to 65% with mild basal septal hypertrophy and no RWMA.  Seen on 12/16/2021 by Dr. Lynnette Caffey to establish care.  Seen in ED April 2023 for nausea and vomiting.  Cardiac biomarkers and EKG were unremarkable.  At office visit she reported increasing shortness of breath with exertion.  When she bends over she feels very fatigued.  Has also noticed that her energy level is quite poor.  She denied exertional chest pain but on occasion gets chest pain at rest.  She was feeling palpitations  almost every day.  She was started on Lasix for peripheral edema and referred for cardiac CTA that was completed 03/12/2022 with mildly elevated PA pressures and nonobstructive CAD.  Event monitor revealed 19 runs of SVT, no atrial fibrillation or sustained ventricular tachycardia.  Cardizem 120 mg daily was added.  2D echo 12/2021 showed normal LVEF, G1 DD, normal RV function with mildly elevated PASP.  Seen in follow-up by Robin Searing, NP on 04/18/2022.  She reported worsening palpitations especially in the early morning associated with anxiety.  Cardizem was increased to 240 mg daily.  She was referred to pulmonology for continued symptoms of dyspnea and advised to follow-up with cardiology in 4 months.  Last cardiology clinic visit was 09/22/2022 with Dr. Lynnette Caffey.  She reported her shortness of breath has improved. Diltiazem had been changed to propranolol due to headaches.  She was seen in the ED September 2023 with chest pain thought to be noncardiac in nature.  Chest CT was negative, troponins were negative.  She continued to smoke about 1/2 pack/day but reported desire to quit.  Carotid ultrasound demonstrated possible fibromuscular dysplasia with plan to repeat 1 year later.  She was encouraged to follow-up with PCP regarding Chantix prescription.  Lab work that day revealed elevated lipoprotein a and LDL of 97.  She was encouraged to increase atorvastatin to 80 mg daily.       History of Present Illness: .   Dorothy White is a *** 72 y.o. female *** Needs lipid  Discussed the use of AI scribe software for clinical note transcription with the patient, who gave verbal consent to proceed.   ROS: ***       Studies Reviewed: .        *** Risk Assessment/Calculations:   {Does this patient have ATRIAL FIBRILLATION?:[phone]} No BP recorded.  {Refresh Note OR Click here to enter BP  :1}***       Physical Exam:   VS:  There were no vitals taken for this visit.   Wt Readings from Last 3  Encounters:  12/10/22 127 lb (57.6 kg)  11/27/22 140 lb (63.5 kg)  09/22/22 136 lb 12.8 oz (62.1 kg)    GEN: Well nourished, well developed in no acute distress NECK: No JVD; No carotid bruits CARDIAC: ***RRR, no murmurs, rubs, gallops RESPIRATORY:  Clear to auscultation without rales, wheezing or rhonchi  ABDOMEN: Soft, non-tender, non-distended EXTREMITIES:  No edema; No deformity     ASSESSMENT AND PLAN: .    CAD: Hyperlipidemia: Need to update lipid panel Hypertension: DOE:    {Are you ordering a CV Procedure (e.g. stress test, cath, DCCV, TEE, etc)?   Press F2        :[id number]}  Dispo: ***  Signed, Eligha Bridegroom, NP-C

## 2023-06-22 ENCOUNTER — Ambulatory Visit: Payer: No Typology Code available for payment source | Admitting: Nurse Practitioner

## 2023-06-23 ENCOUNTER — Encounter: Payer: Self-pay | Admitting: Nurse Practitioner

## 2023-06-29 IMAGING — CR DG CHEST 2V
2 series · 2 of 2 positions shown · non-contrast
Comparison: 01/12/2021.

CLINICAL DATA: Chest pain beginning approximately 1 hour ago,
located in the central chest. Also with lower extremity swelling.

EXAM:
CHEST - 2 VIEW

[w chest pa]
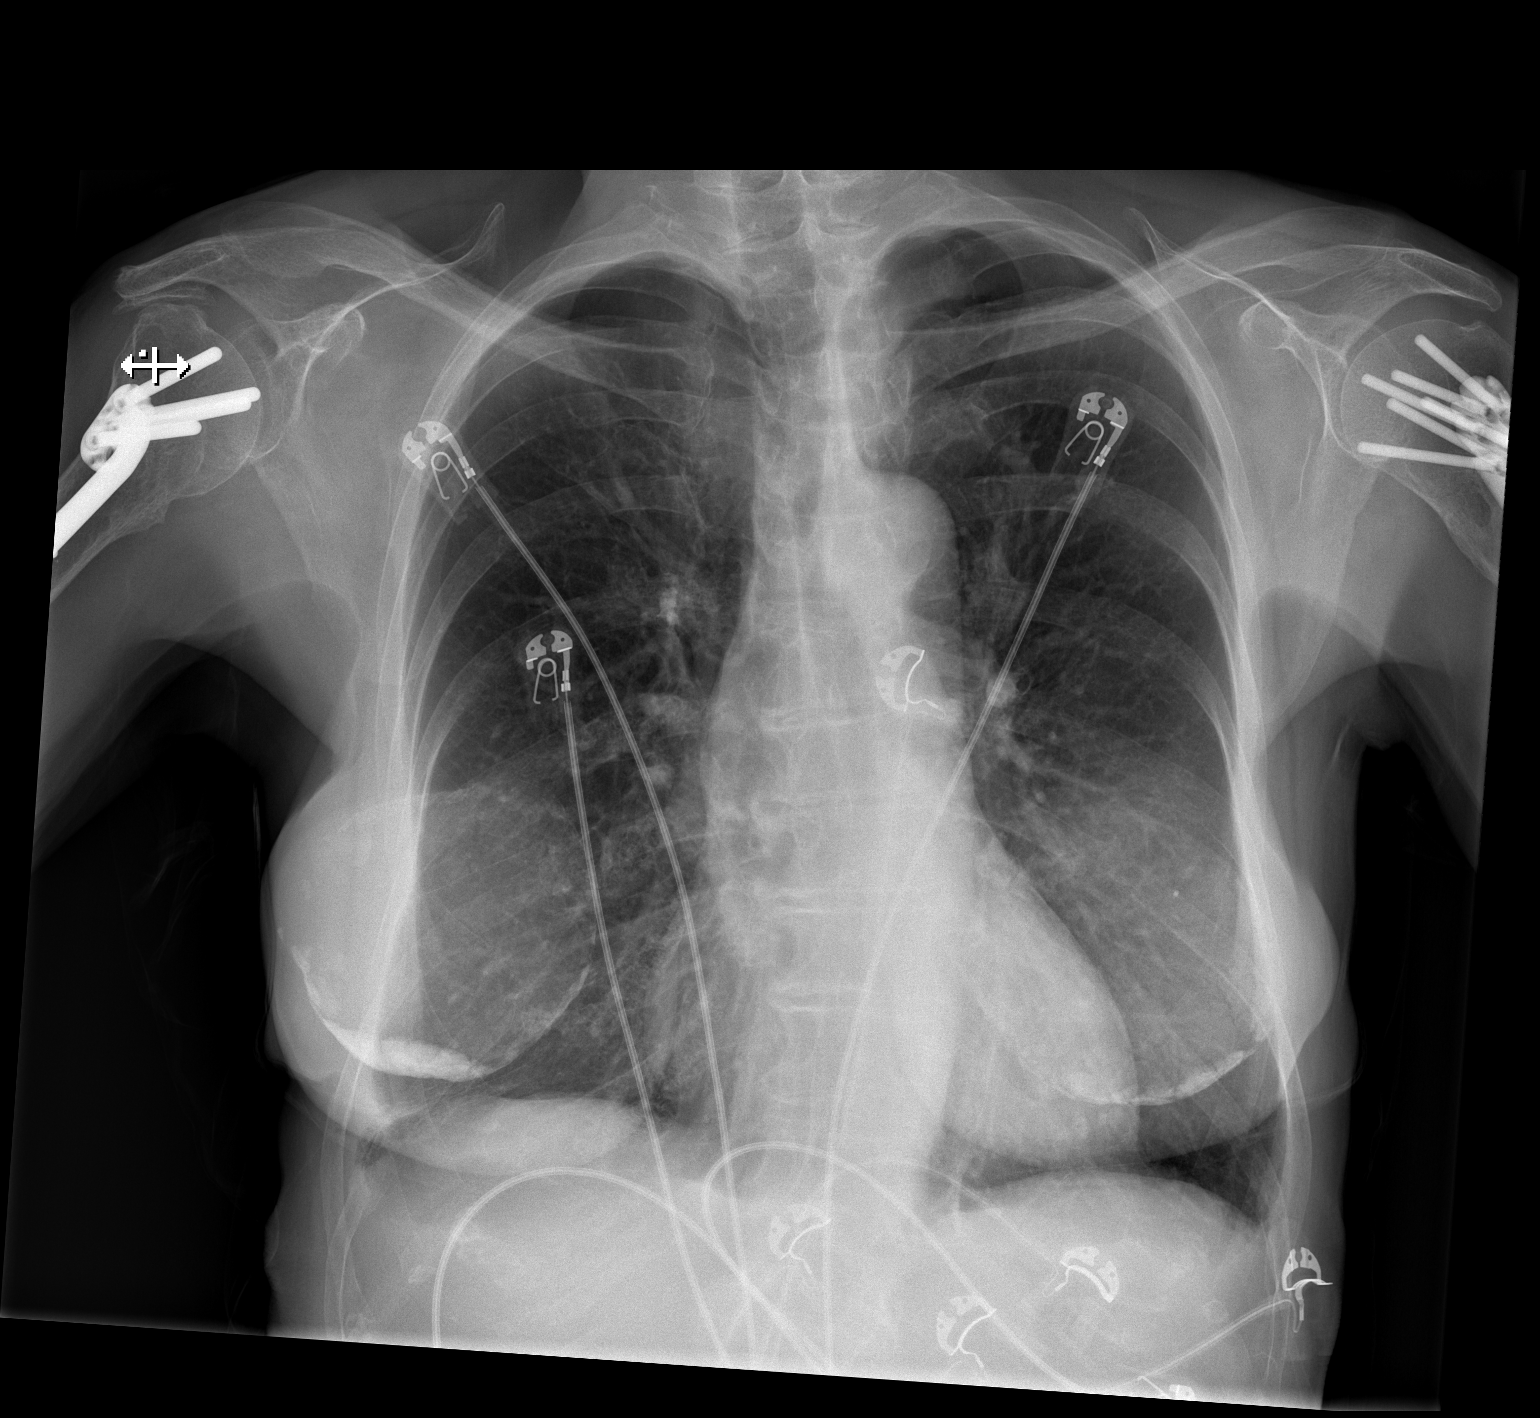

[w chest lat]
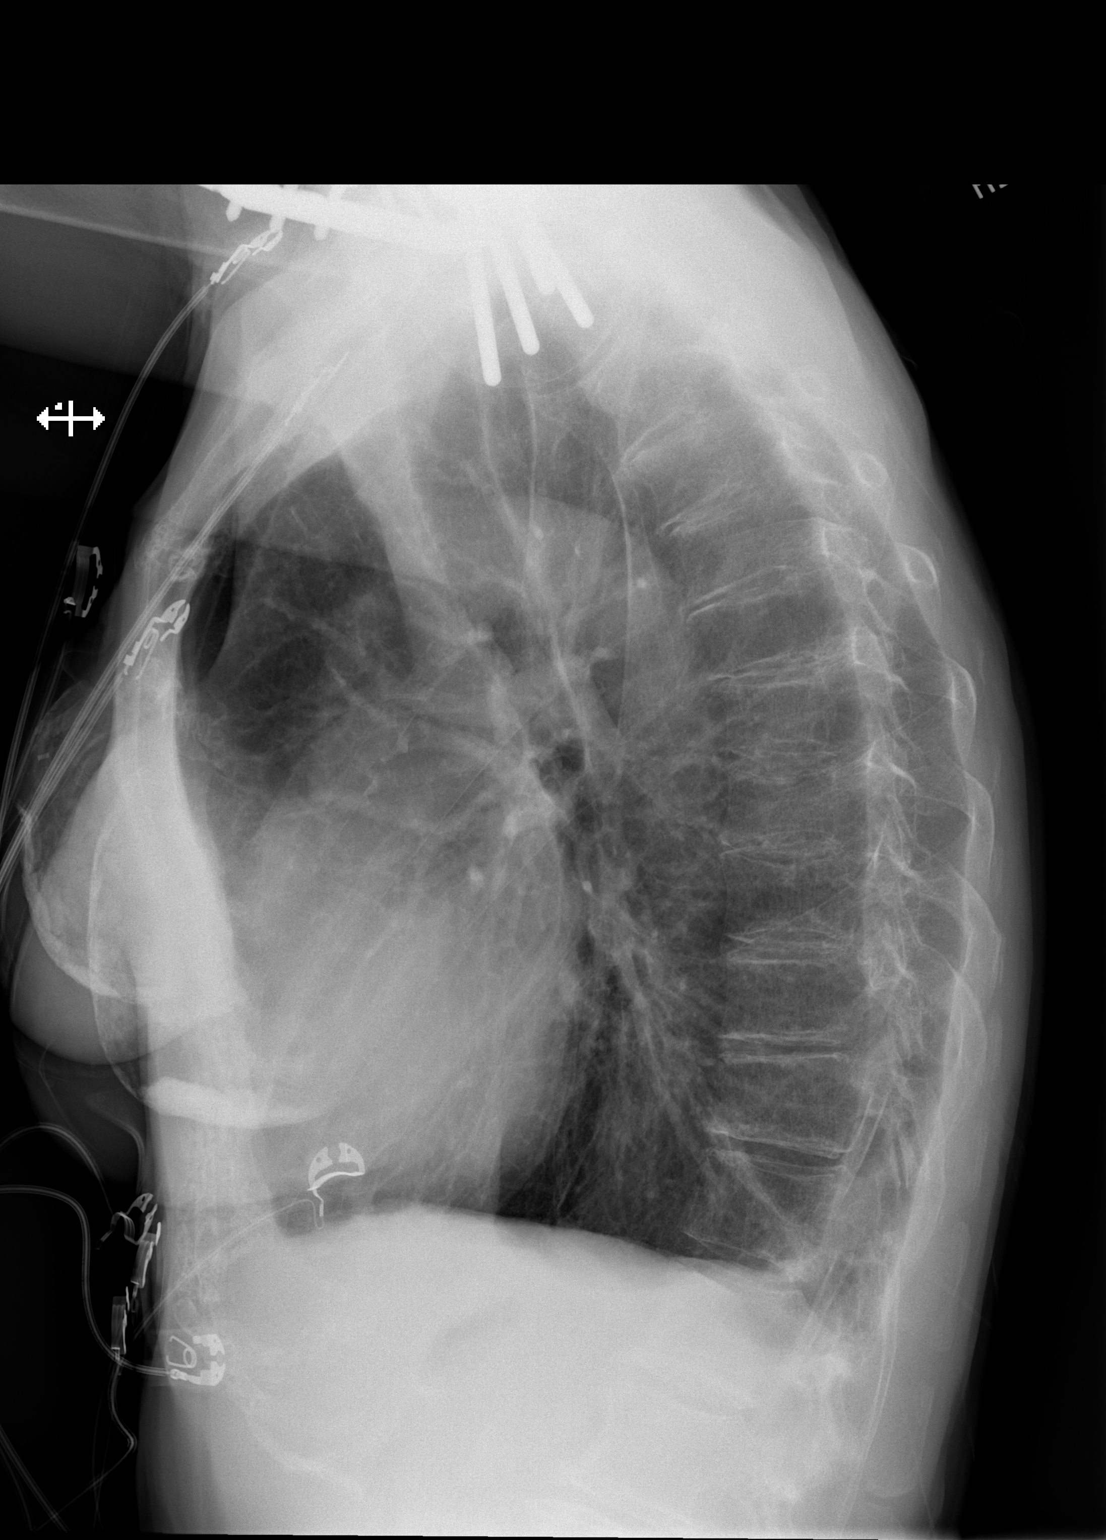

[2 of 2 positions shown; findings below may reference images not displayed]

FINDINGS: Cardiac silhouette is normal in size. No mediastinal or hilar
masses. No evidence of adenopathy.

Lungs are hyperexpanded. Mild linear scarring at the right lateral
lung base. Lungs otherwise clear.

No pleural effusion or pneumothorax.

Bilateral proximal humerus fracture ORIF, stable. No acute skeletal
abnormality.
IMPRESSION: 1. No acute cardiopulmonary disease.

## 2023-07-07 DIAGNOSIS — R600 Localized edema: Secondary | ICD-10-CM | POA: Diagnosis not present

## 2023-07-07 DIAGNOSIS — I872 Venous insufficiency (chronic) (peripheral): Secondary | ICD-10-CM | POA: Diagnosis not present

## 2023-07-09 DIAGNOSIS — H00031 Abscess of right upper eyelid: Secondary | ICD-10-CM | POA: Diagnosis not present

## 2023-07-09 DIAGNOSIS — H02841 Edema of right upper eyelid: Secondary | ICD-10-CM | POA: Diagnosis not present

## 2023-07-27 DIAGNOSIS — F330 Major depressive disorder, recurrent, mild: Secondary | ICD-10-CM | POA: Diagnosis not present

## 2023-07-27 DIAGNOSIS — E559 Vitamin D deficiency, unspecified: Secondary | ICD-10-CM | POA: Diagnosis not present

## 2023-07-27 DIAGNOSIS — G43909 Migraine, unspecified, not intractable, without status migrainosus: Secondary | ICD-10-CM | POA: Diagnosis not present

## 2023-07-27 DIAGNOSIS — N289 Disorder of kidney and ureter, unspecified: Secondary | ICD-10-CM | POA: Diagnosis not present

## 2023-07-27 DIAGNOSIS — E538 Deficiency of other specified B group vitamins: Secondary | ICD-10-CM | POA: Diagnosis not present

## 2023-07-27 DIAGNOSIS — I10 Essential (primary) hypertension: Secondary | ICD-10-CM | POA: Diagnosis not present

## 2023-07-27 DIAGNOSIS — G4700 Insomnia, unspecified: Secondary | ICD-10-CM | POA: Diagnosis not present

## 2023-08-19 DIAGNOSIS — M5451 Vertebrogenic low back pain: Secondary | ICD-10-CM | POA: Diagnosis not present

## 2023-08-19 DIAGNOSIS — Z79899 Other long term (current) drug therapy: Secondary | ICD-10-CM | POA: Diagnosis not present

## 2023-08-19 DIAGNOSIS — G894 Chronic pain syndrome: Secondary | ICD-10-CM | POA: Diagnosis not present

## 2023-08-19 DIAGNOSIS — M5416 Radiculopathy, lumbar region: Secondary | ICD-10-CM | POA: Diagnosis not present

## 2023-08-19 DIAGNOSIS — M4316 Spondylolisthesis, lumbar region: Secondary | ICD-10-CM | POA: Diagnosis not present

## 2023-08-19 DIAGNOSIS — Z5181 Encounter for therapeutic drug level monitoring: Secondary | ICD-10-CM | POA: Diagnosis not present

## 2023-08-19 DIAGNOSIS — M5412 Radiculopathy, cervical region: Secondary | ICD-10-CM | POA: Diagnosis not present

## 2023-08-20 DIAGNOSIS — R0981 Nasal congestion: Secondary | ICD-10-CM | POA: Diagnosis not present

## 2023-08-20 DIAGNOSIS — R058 Other specified cough: Secondary | ICD-10-CM | POA: Diagnosis not present

## 2023-08-27 DIAGNOSIS — J34829 Nasal valve collapse, unspecified: Secondary | ICD-10-CM | POA: Diagnosis not present

## 2023-08-27 DIAGNOSIS — Z8673 Personal history of transient ischemic attack (TIA), and cerebral infarction without residual deficits: Secondary | ICD-10-CM | POA: Diagnosis not present

## 2023-08-27 DIAGNOSIS — I10 Essential (primary) hypertension: Secondary | ICD-10-CM | POA: Diagnosis not present

## 2023-08-27 DIAGNOSIS — J342 Deviated nasal septum: Secondary | ICD-10-CM | POA: Diagnosis not present

## 2023-08-27 DIAGNOSIS — M950 Acquired deformity of nose: Secondary | ICD-10-CM | POA: Diagnosis not present

## 2023-09-01 DIAGNOSIS — M5416 Radiculopathy, lumbar region: Secondary | ICD-10-CM | POA: Diagnosis not present

## 2023-09-01 DIAGNOSIS — M5412 Radiculopathy, cervical region: Secondary | ICD-10-CM | POA: Diagnosis not present

## 2023-09-01 DIAGNOSIS — M5451 Vertebrogenic low back pain: Secondary | ICD-10-CM | POA: Diagnosis not present

## 2023-09-01 DIAGNOSIS — G894 Chronic pain syndrome: Secondary | ICD-10-CM | POA: Diagnosis not present

## 2023-09-01 DIAGNOSIS — M4316 Spondylolisthesis, lumbar region: Secondary | ICD-10-CM | POA: Diagnosis not present

## 2023-09-03 ENCOUNTER — Inpatient Hospital Stay: Admission: RE | Admit: 2023-09-03 | Payer: No Typology Code available for payment source | Source: Ambulatory Visit

## 2023-09-04 ENCOUNTER — Ambulatory Visit: Payer: Medicare HMO | Attending: Physician Assistant | Admitting: Physician Assistant

## 2023-09-04 NOTE — Progress Notes (Deleted)
 Cardiology Office Note:  .   Date:  09/04/2023  ID:  Dorothy White, DOB 04-25-1951, MRN [id number] PCP: Dorothy Anthony RAMAN, FNP  [facility] Providers Cardiologist:  Lurena MARLA Red, MD { Click to update primary MD,subspecialty MD or APP then REFRESH:1}   History of Present Illness: .   Dorothy White is a 73 y.o. female with a past medical history of CTA 2023 with calcium  score of 0, aortic atherosclerosis, hyperlipidemia, previous mild cardiomyopathy with EF 45%, and tobacco abuse here for follow-up appointment.  History includes May 2023 was seen for initial consultation for palpitations and dyspnea.  Referred for coronary CTA, echo, and event monitor.  Coronary CTA showed calcium  score of 0 with no obstructive CAD however did show aortic atherosclerosis.  Monitor showed episode of SVT.  Started on diltiazem  120 mg daily.  Echo showed normal LV function and RV function however mildly elevated PA systolic pressure.  Carotid Dopplers were also negative for obstructive disease.  She was seen 09/22/2022 and at that time she had a monitor done which demonstrated SVT.  Started on diltiazem  but developed migraines from this and eventually changed to propranolol .  Was seen in the emergency department in September with chest pain and was thought to be noncardiac in nature.  Chest CT was negative.  Troponins were negative as well.  She had a coronary CTA demonstrating reassuring findings.  Overall, doing well at her follow-up appointment.  Shortness of breath improved and no chest pain, lightheadedness, syncope/presyncope.  Denied claudication.  Apparently had strained her back and was having a lot of low back pain.  Continue to smoke about a half a pack a day and would like to quit.  Previously on bupropion  and was able to quit but relapsed.  This was about 6 years ago.  Today, she***  ROS: Pertinent ROS in HPI  Studies Reviewed: SABRA        Coronary CTA 2023: 1. Coronary calcium  score of  0. This was 1st percentile for age, sex, and race matched control. 2. Normal coronary origin with Right dominance. 3. CAD-RADS 2. Mild non-obstructive CAD (25-49%). Consider non-atherosclerotic causes of chest pain. Consider preventive therapy and risk factor modification despite zero calcium  score. 4.  Aortic atherosclerosis   TTE 2023   1. Left ventricular ejection fraction, by estimation, is 60 to 65%. The  left ventricle has normal function. The left ventricle has no regional  wall motion abnormalities. Left ventricular diastolic parameters are  consistent with Grade I diastolic  dysfunction (impaired relaxation).   2. Right ventricular systolic function is normal. The right ventricular  size is normal. There is mildly elevated pulmonary artery systolic  pressure. The estimated right ventricular systolic pressure is 37.6 mmHg.   3. The mitral valve is normal in structure. No evidence of mitral valve  regurgitation. No evidence of mitral stenosis.   4. The aortic valve is tricuspid. Aortic valve regurgitation is not  visualized. No aortic stenosis is present.   5. The inferior vena cava is normal in size with greater than 50%  respiratory variability, suggesting right atrial pressure of 3 mmHg.    Monitor 2023: 19 Supraventricular Tachycardia runs occurred, the run with the fastest interval lasting 9 beats with a max rate of 200 bpm, the  longest lasting 10.1 secs with an avg rate of 142 bpm. Supraventricular Tachycardia was detected within +/- 45 seconds of symptomatic patient event(s).    Isolated SVEs were rare (<1.0%), SVE Couplets were  rare (<1.0%), and SVE Triplets were rare (<1.0%).    Isolated VEs were rare (<1.0%), and no VE Couplets or VE Triplets were present.    Patient triggered events corresponded with sinus rhythm and supraventricular tachycardia.   No atrial fibrillation, sustained ventricular tachyarrhythmias, or bradyarrhythmias were detected.   Carotid  ultrasound 2023 Right Carotid: There is no evidence of stenosis in the right ICA. Bead-like appearance in the distal segment with mildly elevated velocities suggestive for fibromuscular dysplasia.   Left Carotid: There is no evidence of stenosis in the left ICA. Bead-like appearance in the distal segment with elevated velocities suggestive for fibromuscular dysplasia.   Vertebrals:  Bilateral vertebral arteries demonstrate antegrade flow.  Subclavians: Normal flow hemodynamics were seen in bilateral subclavian arteries.    Coronary angiography 2017 with mild mid LAD disease   Other studies Reviewed: Review of the additional studies/records demonstrates: CT chest 2018 without aortic atherosclerosis of coronary artery calcification   Recent Labs: 01/21/2022: ALT 10 04/22/2022: BUN 11; Creatinine, Ser 0.67; Hemoglobin 14.2; Magnesium  1.9; Platelets 251; Potassium 4.4; Sodium 142; TSH 1.336    Recent Lipid Panel Labs (Brief)    Risk Assessment/Calculations:   {Does this patient have ATRIAL FIBRILLATION?:[phone]} No BP recorded.  {Refresh Note OR Click here to enter BP  :1}***       Physical Exam:   VS:  There were no vitals taken for this visit.   Wt Readings from Last 3 Encounters:  12/10/22 127 lb (57.6 kg)  11/27/22 140 lb (63.5 kg)  09/22/22 136 lb 12.8 oz (62.1 kg)    GEN: Well nourished, well developed in no acute distress NECK: No JVD; No carotid bruits CARDIAC: ***RRR, no murmurs, rubs, gallops RESPIRATORY:  Clear to auscultation without rales, wheezing or rhonchi  ABDOMEN: Soft, non-tender, non-distended EXTREMITIES:  No edema; No deformity   ASSESSMENT AND PLAN: .   Dyslipidemia Mild CAD Tobacco abuse Fibromuscular dysplasia SVT Hypertension    {Are you ordering a CV Procedure (e.g. stress test, cath, DCCV, TEE, etc)?   Press F2        :[id number]}  Dispo: ***  Signed, Orren LOISE Fabry, PA-C

## 2023-09-07 ENCOUNTER — Encounter: Payer: Self-pay | Admitting: Physician Assistant

## 2023-09-29 DIAGNOSIS — G894 Chronic pain syndrome: Secondary | ICD-10-CM | POA: Diagnosis not present

## 2023-10-04 DIAGNOSIS — W19XXXA Unspecified fall, initial encounter: Secondary | ICD-10-CM | POA: Diagnosis not present

## 2023-10-04 DIAGNOSIS — M25561 Pain in right knee: Secondary | ICD-10-CM | POA: Diagnosis not present

## 2023-10-04 DIAGNOSIS — Y92009 Unspecified place in unspecified non-institutional (private) residence as the place of occurrence of the external cause: Secondary | ICD-10-CM | POA: Diagnosis not present

## 2023-10-04 DIAGNOSIS — M1711 Unilateral primary osteoarthritis, right knee: Secondary | ICD-10-CM | POA: Diagnosis not present

## 2023-10-18 ENCOUNTER — Encounter: Payer: Self-pay | Admitting: Physician Assistant

## 2023-10-18 DIAGNOSIS — I773 Arterial fibromuscular dysplasia: Secondary | ICD-10-CM | POA: Insufficient documentation

## 2023-10-18 DIAGNOSIS — I471 Supraventricular tachycardia, unspecified: Secondary | ICD-10-CM

## 2023-10-18 DIAGNOSIS — I2510 Atherosclerotic heart disease of native coronary artery without angina pectoris: Secondary | ICD-10-CM | POA: Insufficient documentation

## 2023-10-18 HISTORY — DX: Atherosclerotic heart disease of native coronary artery without angina pectoris: I25.10

## 2023-10-18 HISTORY — DX: Supraventricular tachycardia, unspecified: I47.10

## 2023-10-18 NOTE — Progress Notes (Deleted)
   Cardiology Office Note:    Date:  10/18/2023  ID:  JULIANNA White, DOB [birth year], MRN [id number] PCP: Camie Patience, FNP  [facility] Providers Cardiologist:  Orbie Pyo, MD { Click to update primary MD,subspecialty MD or APP then REFRESH:1}    {Click to Open Review  :1}   Patient Profile:      Coronary artery disease  Lex MPI 01/03/16: no ischemia, low risk  LHC 02/20/16:  mLAD 25, EF 55-65 TTE 12/31/21: EF 60-65, no RWMA, Gr 1 DD, NL RVSF, mildly elevated PASP, RVSP 37.6 CCTA 03/12/22: pLAD 25-49, CAC score 0 (1st percentile), nonobstructive CAD  Non-ischemic cardiomyopathy ? During admit for complex migraine  TTE in 11/2015: EF 40-45; min plaque on cath and normal EF Supraventricular Tachycardia  Monitor 12/2021: 19 SVT runs (longest 10.1 sec) Hyperlipidemia  Lp(a) 179.7 Hx of TIA  Fibromuscular dysplasia   [place] 12/24/21: no ICA stenosis bilat; bead like appearance c/w FMD Tobacco use           Discussed the use of AI scribe software for clinical note transcription with the patient, who gave verbal consent to proceed.  History of Present Illness Dorothy White is a 73 y.o. female who presents for follow up of CAD, FMD, SVT. She was last seen by Dr. Lynnette Caffey in 08/2022.    ROS-See HPI***    Studies Reviewed:       *** Results    Risk Assessment/Calculations:   {Does this patient have ATRIAL FIBRILLATION?[phone]} No BP recorded.  {Refresh Note OR Click here to enter BP  :1}***       Physical Exam:   VS:  There were no vitals taken for this visit.   Wt Readings from Last 3 Encounters:  12/10/22 127 lb (57.6 kg)  11/27/22 140 lb (63.5 kg)  09/22/22 136 lb 12.8 oz (62.1 kg)    Physical Exam***     Assessment and Plan:   Assessment & Plan Fibromuscular dysplasia (HCC)  Coronary artery disease involving native coronary artery of native heart without angina pectoris  PSVT (paroxysmal supraventricular tachycardia) (HCC)  Pure  hypercholesterolemia  Assessment and Plan Assessment & Plan    {      :1}    {Are you ordering a CV Procedure (e.g. stress test, cath, DCCV, TEE, etc)?   Press F2        :[id number]}  Dispo:  No follow-ups on file.  Signed, Tereso Newcomer, PA-C

## 2023-10-19 ENCOUNTER — Ambulatory Visit: Payer: Medicare HMO | Attending: Physician Assistant | Admitting: Physician Assistant

## 2023-10-19 DIAGNOSIS — I773 Arterial fibromuscular dysplasia: Secondary | ICD-10-CM

## 2023-10-19 DIAGNOSIS — I2510 Atherosclerotic heart disease of native coronary artery without angina pectoris: Secondary | ICD-10-CM

## 2023-10-19 DIAGNOSIS — I471 Supraventricular tachycardia, unspecified: Secondary | ICD-10-CM

## 2023-10-19 DIAGNOSIS — E7800 Pure hypercholesterolemia, unspecified: Secondary | ICD-10-CM

## 2023-11-15 ENCOUNTER — Other Ambulatory Visit: Payer: Self-pay | Admitting: Internal Medicine

## 2023-11-24 DIAGNOSIS — M545 Low back pain, unspecified: Secondary | ICD-10-CM | POA: Diagnosis not present

## 2023-11-24 DIAGNOSIS — R109 Unspecified abdominal pain: Secondary | ICD-10-CM | POA: Diagnosis not present

## 2023-11-24 DIAGNOSIS — M51362 Other intervertebral disc degeneration, lumbar region with discogenic back pain and lower extremity pain: Secondary | ICD-10-CM | POA: Diagnosis not present

## 2023-11-24 DIAGNOSIS — M5030 Other cervical disc degeneration, unspecified cervical region: Secondary | ICD-10-CM | POA: Diagnosis not present

## 2023-12-04 IMAGING — CR DG CHEST 1V PORT
1 series · 1 of 1 positions shown · non-contrast
Comparison: 05/31/2021

CLINICAL DATA: Chest pain, palpitations

EXAM:
PORTABLE CHEST - 1 VIEW

[w chest pa]
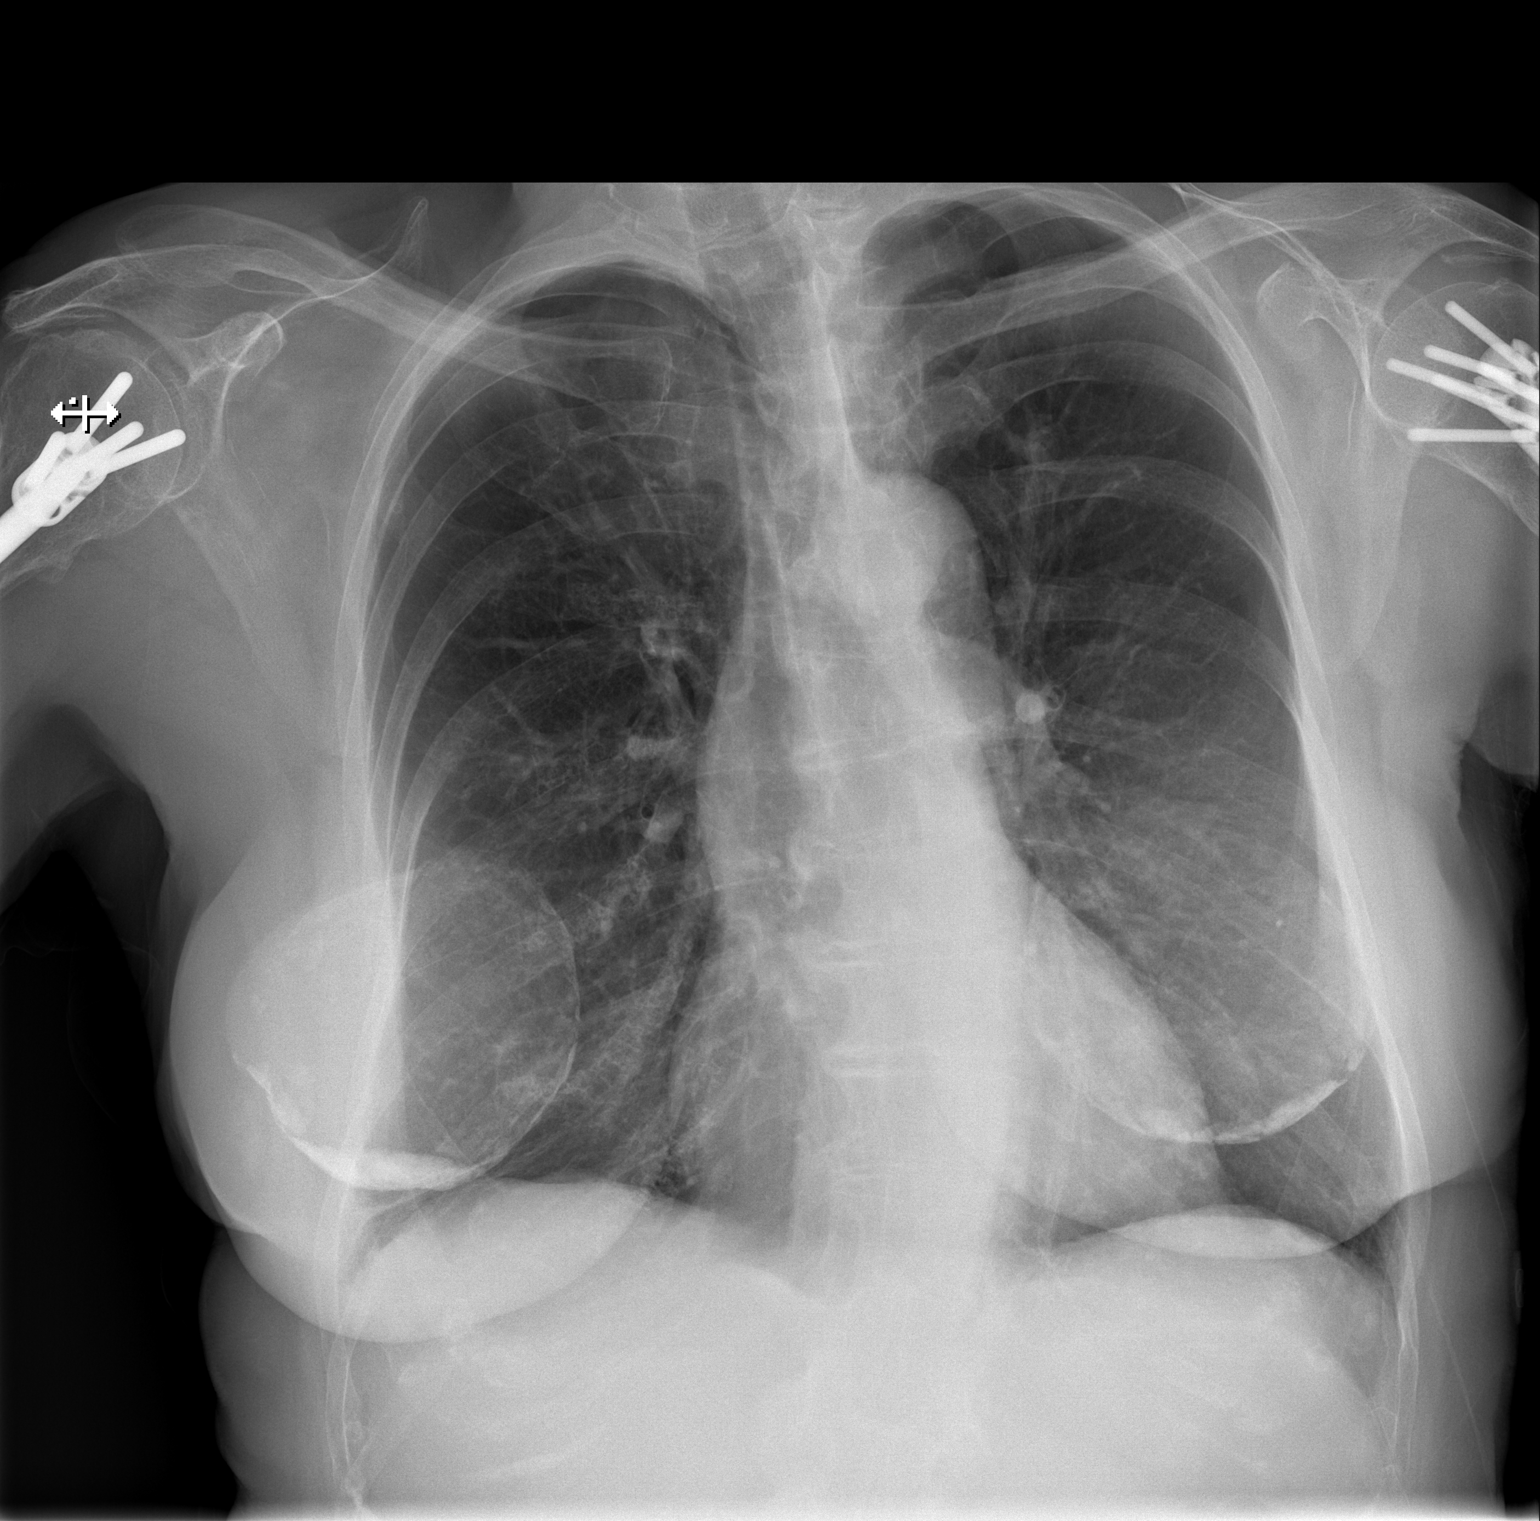

[1 of 1 positions shown; findings below may reference images not displayed]

FINDINGS: Cardiomediastinal silhouette and pulmonary vasculature are within
normal limits.

Lungs are clear.

Bilateral shoulder fixation hardware partially visualized. Bilateral
partially calcified breast prostheses are again seen.
IMPRESSION: No acute cardiopulmonary process.

## 2023-12-16 DIAGNOSIS — M545 Low back pain, unspecified: Secondary | ICD-10-CM | POA: Diagnosis not present

## 2023-12-16 DIAGNOSIS — R109 Unspecified abdominal pain: Secondary | ICD-10-CM | POA: Diagnosis not present

## 2023-12-16 DIAGNOSIS — M51362 Other intervertebral disc degeneration, lumbar region with discogenic back pain and lower extremity pain: Secondary | ICD-10-CM | POA: Diagnosis not present

## 2023-12-16 DIAGNOSIS — M5030 Other cervical disc degeneration, unspecified cervical region: Secondary | ICD-10-CM | POA: Diagnosis not present

## 2023-12-16 DIAGNOSIS — M549 Dorsalgia, unspecified: Secondary | ICD-10-CM | POA: Diagnosis not present

## 2023-12-17 DIAGNOSIS — M5412 Radiculopathy, cervical region: Secondary | ICD-10-CM | POA: Diagnosis not present

## 2023-12-19 DIAGNOSIS — M545 Low back pain, unspecified: Secondary | ICD-10-CM | POA: Diagnosis not present

## 2023-12-22 NOTE — Progress Notes (Unsigned)
 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    CAD (coronary artery disease) 10/18/2023   Lex MPI 01/03/16: no ischemia, low risk     LHC 02/20/16:  mLAD 25, EF 55-65    TTE 12/31/21: EF 60-65, no RWMA, Gr 1 DD, NL RVSF, mildly elevated PASP, RVSP 37.6    CCTA 03/12/22: pLAD 25-49, CAC score 0 (1st percentile), nonobstructive CAD     Chronic back pain    "all over" (04/25/2016)   Constipation    Depression    Diverticulitis    Diverticulitis of sigmoid colon 04/25/2016   GERD (gastroesophageal reflux disease)    Head injury 2019   subdural hematoma   Headache    "weekly" (04/25/2016)   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 25% mLAD, otherwise no sig disease 01/2016 cath.   Myocardial infarction (HCC) 2018   NICM (nonischemic cardiomyopathy) (HCC)    a. EF 40-45% by echo 11/2015 at time of complicated migraine/neuro sx, 55-65% at time of cath 01/2016   Pneumonia    PSVT (paroxysmal supraventricular tachycardia) (HCC) 10/18/2023   Stroke [facility]) 06/2013   "mini" stroke , ?possibly hemaplegic migraine per pt    History of Present Illness  Dorothy White is a 73 y.o. female with PMH of HLD, TIA (2014), NICM, CAD (nonobstructive 2017), tobacco abuse, palpitations who presents today for 18-month follow-up.  [name] was seen in the ED on 04/22/2022 with complaint of chest pain and back pain.  During ED visit her symptoms of chest pain have resolved and EKG was normal  troponins negative x 2.  She was seen in follow-up on 09/22/2022 and reported shortness of breath was improved.  He was noted to have elevated BP which was secondary to back pain which improved upon second reading.  She was seen on 10/03/22 with mechanical fall and head injury.  During her visit she reported feeling off balance and had planned MRI for evaluation of possible stroke.  She elected for discharge home prior to MRI being completed.  She was seen back in the ED on 12/10/2022 after suffering a mechanical fall and right finger fracture requiring closed reduction of fracture.  [name] presents today for annual follow-up.Dorothy White is a 73 year old female with supraventricular tachycardia who presents for a cardiovascular follow-up. She experiences occasional palpitations described as 'little flutters' or 'hard beats' lasting two to three minutes. These episodes occur sporadically, sometimes during relaxation or excitement. She was previously on Cardizem , which was not well tolerated, and is currently on propranolol . No chest pain or shortness of breath. She has chronic back pain, which has been intermittent over the past month. She declined Toradol  or NSAIDs due to her use of naproxen. She walks to the end of her driveway daily and is starting physical therapy. She has been on valsartan 40 mg for about a year with good blood pressure  control. She also takes propranolol , Lasix as needed, and a baby aspirin . She stopped atorvastatin  about a year ago due to memory issues noticed shortly after starting the medication. She experiences occasional dizziness when standing, attributed to her beta blocker medication. She also notes swelling in her legs, particularly in one leg, occurring about once a month and associated with varicose veins. She mentions memory problems over the past year and has not sought medical evaluation for this issue. She also has a history of migraines. Patient denies chest pain,  palpitations, dyspnea, PND, orthopnea, nausea, vomiting, dizziness, syncope, edema, weight gain, or early satiety.  Discussed the use of AI scribe software for clinical note transcription with the patient, who gave verbal consent to proceed.  History of Present Illness   Review of Systems  Please see the history of present illness.    All other systems reviewed and are otherwise negative except as noted above.  Physical Exam     Wt Readings from Last 3 Encounters:  12/10/22 127 lb (57.6 kg)  11/27/22 140 lb (63.5 kg)  09/22/22 136 lb 12.8 oz (62.1 kg)   WU:[id number] were no vitals filed for this visit.,There is no height or weight on file to calculate BMI. GEN: Well nourished, well developed in no acute distress Neck: No JVD; No carotid bruits Pulmonary: Clear to auscultation without rales, wheezing or rhonchi  Cardiovascular: Normal rate. Regular rhythm. Normal S1. Normal S2.   Murmurs: There is no murmur.  ABDOMEN: Soft, non-tender, non-distended EXTREMITIES:  No edema; No deformity   EKG/LABS/ Recent Cardiac Studies   ECG personally reviewed by me today -sinus bradycardia with rate of 54 bpm and no acute changes consistent with previous EKG.  Risk Assessment/Calculations:          Lab Results  Component Value Date   WBC 6.7 12/10/2022   HGB 11.6 (L) 12/10/2022   HCT 37.5 12/10/2022   MCV 87.4 12/10/2022   PLT 218 12/10/2022   Lab Results  Component Value Date   CREATININE 0.66 12/10/2022   BUN 6 (L) 12/10/2022   NA 140 12/10/2022   K 3.9 12/10/2022   CL 108 12/10/2022   CO2 24 12/10/2022   Lab Results  Component Value Date   CHOL 183 09/22/2022   HDL 64 09/22/2022   LDLCALC 97 09/22/2022   TRIG 127 09/22/2022   CHOLHDL 2.9 09/22/2022    Lab Results  Component Value Date   HGBA1C 4.8 02/19/2017   Assessment & Plan    Assessment & Plan  1.Nonobstructive coronary artery disease: -Coronary CTA completed showing nonobstructive  - Continue ASA 81 mg and  propranolol  10 mg twice daily - Patient intolerant to statins due to memory changes - Amatory referral to Pharm.D. to discuss alternatives.  2.  Essential hypertension: - Patient's blood pressure today was stable at 132/68 - Continue propranolol  10 mg twice daily and valsartan 40 mg daily  3.Supraventricular tachycardia:  -SVT with occasional palpitations managed effectively with propranolol . - Continue propranolol  for SVT management. - Advise to report if palpitations become more continuous for further evaluation and possible medication adjustment.  4.  Hyperlipidemia: - Patient's LDL cholesterol is not at goal due to discontinuing statin for complaint of memory loss. - Ambulatory referral to Pharm.D. to discuss alternatives for statins  5.  Aortic atherosclerosis: - Noted on recent cardiac CTA - Referral placed to discuss alternative to statins due to intolerance - Continue good BP control  Disposition: Follow-up with Arun K Thukkani, MD  or APP in 12 months    Signed, Francene Ing, Retha Cast, NP 12/22/2023, 3:34 PM [facility]

## 2023-12-23 ENCOUNTER — Ambulatory Visit: Attending: Nurse Practitioner | Admitting: Nurse Practitioner

## 2023-12-23 ENCOUNTER — Encounter: Payer: Self-pay | Admitting: Nurse Practitioner

## 2023-12-23 VITALS — BP 132/68 | HR 54 | Ht 65.0 in | Wt 136.0 lb

## 2023-12-23 DIAGNOSIS — R030 Elevated blood-pressure reading, without diagnosis of hypertension: Secondary | ICD-10-CM

## 2023-12-23 DIAGNOSIS — I2510 Atherosclerotic heart disease of native coronary artery without angina pectoris: Secondary | ICD-10-CM

## 2023-12-23 DIAGNOSIS — I471 Supraventricular tachycardia, unspecified: Secondary | ICD-10-CM | POA: Diagnosis not present

## 2023-12-23 DIAGNOSIS — E785 Hyperlipidemia, unspecified: Secondary | ICD-10-CM | POA: Diagnosis not present

## 2023-12-23 DIAGNOSIS — I700 Atherosclerosis of aorta: Secondary | ICD-10-CM

## 2023-12-23 NOTE — Patient Instructions (Signed)
 Medication Instructions:  Your physician recommends that you continue on your current medications as directed. Please refer to the Current Medication list given to you today. *If you need a refill on your cardiac medications before your next appointment, please call your pharmacy*  Lab Work: None ordered If you have labs (blood work) drawn today and your tests are completely normal, you will receive your results only by: MyChart Message (if you have MyChart) OR A paper copy in the mail If you have any lab test that is abnormal or we need to change your treatment, we will call you to review the results.  Testing/Procedures: None ordered  Follow-Up: At [facility], you and your health needs are our priority.  As part of our continuing mission to provide you with exceptional heart care, our providers are all part of one team.  This team includes your primary Cardiologist (physician) and Advanced Practice Providers or APPs (Physician Assistants and Nurse Practitioners) who all work together to provide you with the care you need, when you need it.  Your next appointment:   12 month(s)  Provider:   Arun K Thukkani, MD    We recommend signing up for the patient portal called "MyChart".  Sign up information is provided on this After Visit Summary.  MyChart is used to connect with patients for Virtual Visits (Telemedicine).  Patients are able to view lab/test results, encounter notes, upcoming appointments, etc.  Non-urgent messages can be sent to your provider as well.   To learn more about what you can do with MyChart, go to [email or web address].   Other Instructions You have been referred to PharmD.

## 2023-12-29 DIAGNOSIS — M5412 Radiculopathy, cervical region: Secondary | ICD-10-CM | POA: Diagnosis not present

## 2024-01-01 DIAGNOSIS — M545 Low back pain, unspecified: Secondary | ICD-10-CM | POA: Diagnosis not present

## 2024-01-01 DIAGNOSIS — Z79899 Other long term (current) drug therapy: Secondary | ICD-10-CM | POA: Diagnosis not present

## 2024-01-01 DIAGNOSIS — G8929 Other chronic pain: Secondary | ICD-10-CM | POA: Diagnosis not present

## 2024-01-01 DIAGNOSIS — F1120 Opioid dependence, uncomplicated: Secondary | ICD-10-CM | POA: Diagnosis not present

## 2024-01-01 DIAGNOSIS — M542 Cervicalgia: Secondary | ICD-10-CM | POA: Diagnosis not present

## 2024-01-01 DIAGNOSIS — M5412 Radiculopathy, cervical region: Secondary | ICD-10-CM | POA: Diagnosis not present

## 2024-01-01 DIAGNOSIS — M549 Dorsalgia, unspecified: Secondary | ICD-10-CM | POA: Diagnosis not present

## 2024-01-03 ENCOUNTER — Encounter: Payer: Self-pay | Admitting: Orthopaedic Surgery

## 2024-01-05 DIAGNOSIS — Z79899 Other long term (current) drug therapy: Secondary | ICD-10-CM | POA: Diagnosis not present

## 2024-01-06 DIAGNOSIS — M5412 Radiculopathy, cervical region: Secondary | ICD-10-CM | POA: Diagnosis not present

## 2024-01-20 ENCOUNTER — Ambulatory Visit: Admitting: Orthopaedic Surgery

## 2024-01-20 DIAGNOSIS — M5412 Radiculopathy, cervical region: Secondary | ICD-10-CM | POA: Diagnosis not present

## 2024-01-27 ENCOUNTER — Ambulatory Visit

## 2024-02-04 ENCOUNTER — Other Ambulatory Visit: Payer: Self-pay | Admitting: Neurosurgery

## 2024-02-04 ENCOUNTER — Other Ambulatory Visit: Payer: Self-pay | Admitting: Family Medicine

## 2024-02-04 DIAGNOSIS — N6320 Unspecified lump in the left breast, unspecified quadrant: Secondary | ICD-10-CM

## 2024-02-04 DIAGNOSIS — M4722 Other spondylosis with radiculopathy, cervical region: Secondary | ICD-10-CM

## 2024-02-04 DIAGNOSIS — G950 Syringomyelia and syringobulbia: Secondary | ICD-10-CM

## 2024-02-04 DIAGNOSIS — N6310 Unspecified lump in the right breast, unspecified quadrant: Secondary | ICD-10-CM

## 2024-02-11 ENCOUNTER — Ambulatory Visit: Admitting: Pharmacist

## 2024-02-12 ENCOUNTER — Other Ambulatory Visit: Payer: Self-pay | Admitting: Internal Medicine

## 2024-02-18 ENCOUNTER — Other Ambulatory Visit

## 2024-02-23 ENCOUNTER — Ambulatory Visit: Admission: RE | Admit: 2024-02-23 | Source: Ambulatory Visit

## 2024-02-23 ENCOUNTER — Ambulatory Visit
Admission: RE | Admit: 2024-02-23 | Discharge: 2024-02-23 | Disposition: A | Discharge status: Home / Self Care | Source: Ambulatory Visit | Attending: Family Medicine | Admitting: Family Medicine

## 2024-02-23 DIAGNOSIS — N6310 Unspecified lump in the right breast, unspecified quadrant: Secondary | ICD-10-CM

## 2024-02-23 DIAGNOSIS — N6320 Unspecified lump in the left breast, unspecified quadrant: Secondary | ICD-10-CM

## 2024-02-26 ENCOUNTER — Ambulatory Visit
Admission: RE | Admit: 2024-02-26 | Discharge: 2024-02-26 | Disposition: A | Discharge status: Home / Self Care | Source: Ambulatory Visit | Attending: Neurosurgery | Admitting: Neurosurgery

## 2024-02-26 DIAGNOSIS — G950 Syringomyelia and syringobulbia: Secondary | ICD-10-CM

## 2024-02-26 DIAGNOSIS — M4722 Other spondylosis with radiculopathy, cervical region: Secondary | ICD-10-CM

## 2024-02-26 MED ORDER — GADOPICLENOL 0.5 MMOL/ML IV SOLN
6.0000 mL | Freq: Once | INTRAVENOUS | Status: AC | PRN
  Administered 2024-02-26: 6 mL via INTRAVENOUS

## 2024-02-27 ENCOUNTER — Encounter: Payer: Self-pay | Admitting: Internal Medicine

## 2024-03-01 ENCOUNTER — Ambulatory Visit (INDEPENDENT_AMBULATORY_CARE_PROVIDER_SITE_OTHER): Admitting: Podiatry

## 2024-03-01 DIAGNOSIS — M79672 Pain in left foot: Secondary | ICD-10-CM

## 2024-03-01 DIAGNOSIS — B351 Tinea unguium: Secondary | ICD-10-CM

## 2024-03-01 DIAGNOSIS — M2010 Hallux valgus (acquired), unspecified foot: Secondary | ICD-10-CM | POA: Diagnosis not present

## 2024-03-01 NOTE — Progress Notes (Signed)
 Patient presents for evaluation and treatment of tenderness and some redness around nails feet.  Tenderness around toes with walking and wearing shoes.  Also has bunions it bothers him.  He has some soreness on the hallux over the IPJ plantar medial.  She had surgery in 2004 for correction of the bunions in [place] .  He had some pain on around the bunions if she does not wear proper shoes.  2 physical exam:  General appearance: Alert, pleasant, and in no acute distress.  Vascular: Pedal pulses: DP 2/4 B/L, PT 2/4 B/L. Mild edema lower legs bilaterally.  Neurological:  light touch intact bilaterally.  Achilles tendon reflex intact bilaterally.  No spasticity or clonus noted.  Dermatologic Nails thickened, disfigured, discolored 1-5 BL with subungual debris.  Redness and hypertrophic nail folds along nail folds bilaterally but no signs of drainage or infection.  Musculoskeletal:  Severe hallux abductovalgus deformities bilaterally.  Some decreased range of motion first metatarsal phalangeal joint bilaterally.  Some soreness over the plantar medial aspect of the hallux bilaterally at the IPJ's.   Diagnosis: 1. Painful onychomycotic nails 1 through 5 bilaterally. 2. Pain toes 1 through 5 bilaterally. 3.  Severe hallux valgus deformities bilaterally  Plan: -New patient office visit for evaluation and management level 3.  Modifier 25 -Discussed with the nails and onychomycosis.  Discussed etiology and treatment.  We just recommend periodic debridement of the nails.  Discussed the hallux abductovalgus deformities.  Discussed with her that sometimes these can recur.  Given the little problem she has with would recommend just leaving them alone.  Avoid shoes that bother them.  -Debrided onychomycotic nails 1 through 5 bilaterally.  Return 3 months RFC

## 2024-03-10 ENCOUNTER — Encounter: Payer: Self-pay | Admitting: Internal Medicine

## 2024-03-16 ENCOUNTER — Emergency Department (HOSPITAL_BASED_OUTPATIENT_CLINIC_OR_DEPARTMENT_OTHER): Admitting: Radiology

## 2024-03-16 ENCOUNTER — Emergency Department (HOSPITAL_BASED_OUTPATIENT_CLINIC_OR_DEPARTMENT_OTHER)
Admission: EM | Admit: 2024-03-16 | Discharge: 2024-03-16 | Disposition: A | Discharge status: Home / Self Care | Attending: Emergency Medicine | Admitting: Emergency Medicine

## 2024-03-16 ENCOUNTER — Encounter (HOSPITAL_BASED_OUTPATIENT_CLINIC_OR_DEPARTMENT_OTHER): Payer: Self-pay | Admitting: Emergency Medicine

## 2024-03-16 ENCOUNTER — Other Ambulatory Visit: Payer: Self-pay

## 2024-03-16 DIAGNOSIS — Z7982 Long term (current) use of aspirin: Secondary | ICD-10-CM | POA: Diagnosis not present

## 2024-03-16 DIAGNOSIS — W540XXA Bitten by dog, initial encounter: Secondary | ICD-10-CM | POA: Diagnosis not present

## 2024-03-16 DIAGNOSIS — S61051A Open bite of right thumb without damage to nail, initial encounter: Secondary | ICD-10-CM | POA: Insufficient documentation

## 2024-03-16 DIAGNOSIS — I2510 Atherosclerotic heart disease of native coronary artery without angina pectoris: Secondary | ICD-10-CM | POA: Insufficient documentation

## 2024-03-16 MED ORDER — OXYCODONE-ACETAMINOPHEN 5-325 MG PO TABS
1.0000 | ORAL_TABLET | Freq: Once | ORAL | Status: AC
  Administered 2024-03-16: 1 via ORAL
  Filled 2024-03-16: qty 1

## 2024-03-16 MED ORDER — AMOXICILLIN-POT CLAVULANATE 875-125 MG PO TABS: 1.0000 | ORAL_TABLET | Freq: Two times a day (BID) | ORAL | 0 refills | Status: DC

## 2024-03-16 MED ORDER — AMOXICILLIN-POT CLAVULANATE 875-125 MG PO TABS
1.0000 | ORAL_TABLET | Freq: Once | ORAL | Status: AC
  Administered 2024-03-16: 1 via ORAL
  Filled 2024-03-16: qty 1

## 2024-03-16 MED ORDER — OXYCODONE-ACETAMINOPHEN 5-325 MG PO TABS: 1.0000 | ORAL_TABLET | Freq: Four times a day (QID) | ORAL | 0 refills | Status: AC | PRN

## 2024-03-16 NOTE — Discharge Instructions (Addendum)
 Soak the right thumb in warm water  for 20 minutes twice a day.  Elevate it is much as possible put a pillow on your chest when sleeping.  Take the Augmentin  as directed.  Take the Percocet as needed for pain.  Make an appointment to follow-up with your regular doctor to have it rechecked.  If the finger gets worse turns into a hot dog finger you have redness streaking up into your wrist or forearm you need to get seen right away.  Hopefully the antibiotics prevent infection.  X-ray was negative for any bony abnormalities.

## 2024-03-16 NOTE — ED Provider Notes (Signed)
 [place] EMERGENCY DEPARTMENT AT [facility] Provider Note   CSN: [id number] Arrival date & time: 03/16/24  8175     Patient presents with: Animal Bite   Dorothy White is a 73 y.o. female.   Patient status post dog bite to her right thumb.  Earlier today.  It is her dog dog's immunizations are up-to-date.  Medium size dog.  Bite to the right thumb with some puncture wounds.  It is swollen.  Patient is up-to-date on her tetanus.  No other injuries.  Patient has penicillin listed as an allergy but she has had amoxicillin  and Augmentin  before.  Past medical history significant for diverticulitis depression anxiety hyperlipidemia chronic back pain gastroesophageal reflux disease migraines mild coronary disease MI in 2018 coronary disease with stents.  Tree of PSVT.  Patient is not on blood thinners.       Prior to Admission medications   Medication Sig Start Date End Date Taking? Authorizing Provider  amoxicillin -clavulanate (AUGMENTIN ) 875-125 MG tablet Take 1 tablet by mouth every 12 (twelve) hours. 03/16/24  Yes Sharolyn Weber, MD  oxyCODONE -acetaminophen  (PERCOCET/ROXICET) 5-325 MG tablet Take 1 tablet by mouth every 6 (six) hours as needed for severe pain (pain score 7-10). 03/16/24  Yes Treylon Henard, MD  acetaminophen  (TYLENOL ) 500 MG tablet Take 1,000 mg by mouth every 8 (eight) hours as needed for headache (pain).    [provider]  amoxicillin -clavulanate (AUGMENTIN ) 875-125 MG tablet Take 1 tablet by mouth every 12 (twelve) hours. 11/28/22   Neldon Hamp RAMAN, PA  aspirin  EC 81 MG tablet Take 1 tablet (81 mg total) by mouth daily. Swallow whole. 12/16/21   Thukkani, Arun K, MD  atorvastatin  (LIPITOR) 80 MG tablet TAKE 1 TABLET BY MOUTH EVERY DAY 02/12/24   Thukkani, Arun K, MD  cetirizine  (ZYRTEC ) 10 MG tablet Take 10 mg by mouth at bedtime.    [provider]  docusate sodium  (COLACE) 100 MG capsule Take 1 capsule (100 mg total) by mouth daily as  needed for mild constipation. Patient taking differently: Take 300 mg by mouth daily as needed for mild constipation. 01/23/22   Cheryle Page, MD  ferrous sulfate  325 (65 FE) MG tablet Take 1 tablet (325 mg total) by mouth daily with breakfast. 01/24/22   Cheryle Page, MD  FLUoxetine  (PROZAC ) 40 MG capsule Take 80 mg by mouth every morning. 04/18/18   [provider]  fluticasone  (FLONASE ) 50 MCG/ACT nasal spray Place 2 sprays into both nostrils daily for 14 days. Patient taking differently: Place 2 sprays into both nostrils daily as needed for allergies. 11/28/22 03/01/24  Neldon Hamp RAMAN, PA  furosemide (LASIX) 20 MG tablet Take 20 mg by mouth daily as needed for fluid or edema (ankle swelling). 10/25/21   [provider]  gabapentin  (NEURONTIN ) 100 MG capsule Take 300 mg by mouth 3 (three) times daily.    [provider]  mirtazapine  (REMERON ) 15 MG tablet TAKE 1 TABLET BY MOUTH EVERYDAY AT BEDTIME 10/28/21   Tat, Asberry RAMAN, DO  omeprazole  (PRILOSEC) 20 MG capsule Take 20 mg by mouth every morning.    [provider]  promethazine  (PHENERGAN ) 12.5 MG tablet Take 12.5 mg by mouth every 8 (eight) hours as needed for vomiting or nausea.    [provider]  propranolol  (INDERAL ) 10 MG tablet Take 1 tablet (10 mg total) by mouth 2 (two) times daily. 04/22/22   Rancour, Garnette, MD  SUMAtriptan  (IMITREX ) 100 MG tablet Take 50-100 mg by mouth  daily as needed for migraine. 12/24/18   [provider]  tiZANidine  (ZANAFLEX ) 4 MG tablet Take 4 mg by mouth 3 (three) times daily as needed for muscle spasms. 04/10/22   [provider]  traZODone  (DESYREL ) 100 MG tablet Take 100 mg by mouth at bedtime. 09/25/21   [provider]  valsartan (DIOVAN) 40 MG tablet Take 40 mg by mouth daily.    [provider]    Allergies: Atorvastatin , Opana [oxymorphone hcl], and Penicillins    Review of Systems  Constitutional:  Negative for chills and  fever.  HENT:  Negative for ear pain and sore throat.   Eyes:  Negative for pain and visual disturbance.  Respiratory:  Negative for cough and shortness of breath.   Cardiovascular:  Negative for chest pain and palpitations.  Gastrointestinal:  Negative for abdominal pain and vomiting.  Genitourinary:  Negative for dysuria and hematuria.  Musculoskeletal:  Negative for arthralgias and back pain.  Skin:  Positive for wound. Negative for color change and rash.  Neurological:  Negative for seizures and syncope.  All other systems reviewed and are negative.   Updated Vital Signs BP (!) 151/71 (BP Location: Right Arm)   Pulse 66   Temp 98.7 F (37.1 C) (Oral)   Resp 18   Wt 61.7 kg   SpO2 99%   BMI 22.64 kg/m   Physical Exam Vitals and nursing note reviewed.  Constitutional:      General: She is not in acute distress.    Appearance: Normal appearance. She is well-developed. She is not ill-appearing.  HENT:     Head: Normocephalic and atraumatic.  Eyes:     Extraocular Movements: Extraocular movements intact.     Conjunctiva/sclera: Conjunctivae normal.     Pupils: Pupils are equal, round, and reactive to light.  Cardiovascular:     Rate and Rhythm: Normal rate and regular rhythm.     Heart sounds: No murmur heard. Pulmonary:     Effort: Pulmonary effort is normal. No respiratory distress.     Breath sounds: Normal breath sounds.  Abdominal:     Palpations: Abdomen is soft.     Tenderness: There is no abdominal tenderness.  Musculoskeletal:        General: Swelling, tenderness and signs of injury present.     Cervical back: Neck supple.     Comments: Right thumb with dorsal and palmar puncture wound.  No laceration.  Swelling to the thumb itself.  But not swollen distally.  Good range of motion.  But there is some discomfort with it.  No red streaking into the hand or wrist.  Neurovascularly intact.  Skin:    General: Skin is warm and dry.     Capillary Refill: Capillary  refill takes less than 2 seconds.  Neurological:     General: No focal deficit present.     Mental Status: She is alert and oriented to person, place, and time.  Psychiatric:        Mood and Affect: Mood normal.     (all labs ordered are listed, but only abnormal results are displayed) Labs Reviewed - No data to display  EKG: None  Radiology: DG Hand Complete Right Result Date: 03/16/2024 CLINICAL DATA:  Pain EXAM: RIGHT HAND - COMPLETE 3+ VIEW COMPARISON:  Right hand Nov 28, 2022. FINDINGS: There is no evidence of fracture or dislocation. There is no evidence of arthropathy or other focal bone abnormality. Soft tissues are unremarkable. IMPRESSION: Negative. Electronically Signed  By: Megan  Zare M.D.   On: 03/16/2024 19:13     Procedures   Medications Ordered in the ED  oxyCODONE -acetaminophen  (PERCOCET/ROXICET) 5-325 MG per tablet 1 tablet (1 tablet Oral Given 03/16/24 2022)  amoxicillin -clavulanate (AUGMENTIN ) 875-125 MG per tablet 1 tablet (1 tablet Oral Given 03/16/24 2022)                                    Medical Decision Making Amount and/or Complexity of Data Reviewed Radiology: ordered.  Risk Prescription drug management.   X-ray of the right thumb without any bony abnormalities.  Some puncture wounds to the thumb both on the dorsum and palmar side.  There is some swelling and some redness.  No signs of any deep space infection at this time.  Will start her on Augmentin  patient requesting Percocet for pain.  Will provide 1 dose each here and then send additional doses to [facility] on [address] as that is open 24/7.  Final diagnoses:  Dog bite, initial encounter    ED Discharge Orders          Ordered    amoxicillin -clavulanate (AUGMENTIN ) 875-125 MG tablet  Every 12 hours        03/16/24 2021    oxyCODONE -acetaminophen  (PERCOCET/ROXICET) 5-325 MG tablet  Every 6 hours PRN        03/16/24 2022               Raenell Mensing, MD 03/16/24 2027
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
## 2024-03-16 NOTE — ED Triage Notes (Addendum)
 States her dog bit her R thumb. No lac. Mildly swollen.   Dog up to date on vaccine. Patient up to date on tetanus.

## 2024-03-17 ENCOUNTER — Other Ambulatory Visit: Payer: Self-pay

## 2024-03-17 ENCOUNTER — Ambulatory Visit: Attending: Neurosurgery

## 2024-03-17 DIAGNOSIS — R293 Abnormal posture: Secondary | ICD-10-CM | POA: Diagnosis present

## 2024-03-17 DIAGNOSIS — M6281 Muscle weakness (generalized): Secondary | ICD-10-CM | POA: Insufficient documentation

## 2024-03-17 DIAGNOSIS — R252 Cramp and spasm: Secondary | ICD-10-CM | POA: Diagnosis present

## 2024-03-17 DIAGNOSIS — M542 Cervicalgia: Secondary | ICD-10-CM | POA: Insufficient documentation

## 2024-03-17 DIAGNOSIS — M5412 Radiculopathy, cervical region: Secondary | ICD-10-CM | POA: Insufficient documentation

## 2024-03-21 ENCOUNTER — Ambulatory Visit: Admitting: Physical Therapy

## 2024-03-23 ENCOUNTER — Ambulatory Visit: Admitting: Physical Therapy

## 2024-03-23 ENCOUNTER — Encounter: Payer: Self-pay | Admitting: Physical Therapy

## 2024-03-23 DIAGNOSIS — M5412 Radiculopathy, cervical region: Secondary | ICD-10-CM

## 2024-03-23 DIAGNOSIS — M542 Cervicalgia: Secondary | ICD-10-CM | POA: Diagnosis not present

## 2024-03-23 DIAGNOSIS — R252 Cramp and spasm: Secondary | ICD-10-CM

## 2024-03-23 DIAGNOSIS — R293 Abnormal posture: Secondary | ICD-10-CM

## 2024-03-23 DIAGNOSIS — M6281 Muscle weakness (generalized): Secondary | ICD-10-CM
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
## 2024-03-24 ENCOUNTER — Ambulatory Visit: Attending: Cardiology | Admitting: Pharmacist

## 2024-03-24 NOTE — Progress Notes (Deleted)
 Patient ID: ORIA KLIMAS                 DOB: February 10, 1951                    MRN: [id number]      HPI: Dorothy White is a 73 y.o. female patient of Dr. Wendel referred to lipid clinic by Jackee Alberts. PMH is significant for HLD, TIA (2014), NICM, CAD (nonobstructive 2017), tobacco abuse, palpitations.  Patient seen 12/23/23 by Jackee Alberts. Patient reported she stopped her atorvastatin  80mg  about a year ago due to memory issues she noticed shortly after starting. Per note, she continues to have memory issues.   Has taken various doses of atorvastatin ? Try a different statin? 90 days dispensed in April- on it in June?   Reviewed options for lowering LDL cholesterol, including ezetimibe, PCSK-9 inhibitors, bempedoic acid and inclisiran.  Discussed mechanisms of action, dosing, side effects and potential decreases in LDL cholesterol.  Also reviewed cost information and potential options for patient assistance.   Current Medications:  Intolerances: atorvastatin  80mg  (memory problems) Risk Factors: TIA, non-obstructive CAD, tobacco use LDL-C goal: <70 ApoB goal:   Diet:   Exercise:   Family History:   Social History:   Labs:  Lipid Panel w/reflex Reviewed date:01/25/2024 08:17:42 PM Interpretation:LDL 100 Performing Lab: Notes/Report: Testing Performed at: [facility], [address], [address], [place], [place] [address]  Cholesterol 176 <200 mg/dL    CHOL/HDL 3.4 7.9-5.9 Ratio    HDLD 52 30-85 mg/dL Values below 40 mg/dL indicate increased risk factor  Triglyceride 135 0-199 mg/dL    NHDL 875 9-870 mg/dL Range dependent upon risk factors.  LDL Chol Calc (NIH) 100 0-99 mg/dL     Lipid Panel     Component Value Date/Time   CHOL 183 09/22/2022 1431   TRIG 127 09/22/2022 1431   HDL 64 09/22/2022 1431   CHOLHDL 2.9 09/22/2022 1431   CHOLHDL 3.7 02/18/2017 1954   VLDL 28 02/18/2017 1954   LDLCALC 97 09/22/2022 1431   LABVLDL 22 09/22/2022 1431    Past Medical  History:  Diagnosis Date   Anemia    Anxiety    Arthritis    everywhere (04/25/2016)   Basal cell carcinoma of left nasal tip    CAD (coronary artery disease) 10/18/2023   Lex MPI 01/03/16: no ischemia, low risk     LHC 02/20/16:  mLAD 25, EF 55-65    TTE 12/31/21: EF 60-65, no RWMA, Gr 1 DD, NL RVSF, mildly elevated PASP, RVSP 37.6    CCTA 03/12/22: pLAD 25-49, CAC score 0 (1st percentile), nonobstructive CAD     Chronic back pain    all over (04/25/2016)   Constipation    Depression    Diverticulitis    Diverticulitis of sigmoid colon 04/25/2016   GERD (gastroesophageal reflux disease)    Head injury 2019   subdural hematoma   Headache    weekly (04/25/2016)   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 25% mLAD, otherwise no sig disease 01/2016 cath.   Myocardial infarction (HCC) 2018   NICM (nonischemic cardiomyopathy) (HCC)    a. EF 40-45% by echo 11/2015 at time of complicated migraine/neuro sx, 55-65% at time of cath 01/2016   Pneumonia  PSVT (paroxysmal supraventricular tachycardia) (HCC) 10/18/2023   Stroke (HCC) 06/2013   mini stroke , ?possibly hemaplegic migraine per pt    Current Outpatient Medications on File Prior to Visit  Medication Sig Dispense Refill   acetaminophen  (TYLENOL ) 500 MG tablet Take 1,000 mg by mouth every 8 (eight) hours as needed for headache (pain).     amoxicillin -clavulanate (AUGMENTIN ) 875-125 MG tablet Take 1 tablet by mouth every 12 (twelve) hours. 14 tablet 0   amoxicillin -clavulanate (AUGMENTIN ) 875-125 MG tablet Take 1 tablet by mouth every 12 (twelve) hours. 14 tablet 0   aspirin  EC 81 MG tablet Take 1 tablet (81 mg total) by mouth daily. Swallow whole. 90 tablet 3   atorvastatin  (LIPITOR) 80 MG tablet TAKE 1 TABLET BY MOUTH EVERY DAY 90 tablet 3   cetirizine   (ZYRTEC ) 10 MG tablet Take 10 mg by mouth at bedtime.     docusate sodium  (COLACE) 100 MG capsule Take 1 capsule (100 mg total) by mouth daily as needed for mild constipation.     ferrous sulfate  325 (65 FE) MG tablet Take 1 tablet (325 mg total) by mouth daily with breakfast. 30 tablet 0   FLUoxetine  (PROZAC ) 40 MG capsule Take 80 mg by mouth every morning.  11   fluticasone  (FLONASE ) 50 MCG/ACT nasal spray Place 2 sprays into both nostrils daily for 14 days. (Patient taking differently: Place 2 sprays into both nostrils daily as needed for allergies.) 11.1 mL 0   furosemide (LASIX) 20 MG tablet Take 20 mg by mouth daily as needed for fluid or edema (ankle swelling).     gabapentin  (NEURONTIN ) 100 MG capsule Take 300 mg by mouth 3 (three) times daily.     mirtazapine  (REMERON ) 15 MG tablet TAKE 1 TABLET BY MOUTH EVERYDAY AT BEDTIME 15 tablet 0   omeprazole  (PRILOSEC) 20 MG capsule Take 20 mg by mouth every morning.     oxyCODONE -acetaminophen  (PERCOCET/ROXICET) 5-325 MG tablet Take 1 tablet by mouth every 6 (six) hours as needed for severe pain (pain score 7-10). 15 tablet 0   promethazine  (PHENERGAN ) 12.5 MG tablet Take 12.5 mg by mouth every 8 (eight) hours as needed for vomiting or nausea.     propranolol  (INDERAL ) 10 MG tablet Take 1 tablet (10 mg total) by mouth 2 (two) times daily. 60 tablet 0   SUMAtriptan  (IMITREX ) 100 MG tablet Take 50-100 mg by mouth daily as needed for migraine.     tiZANidine  (ZANAFLEX ) 4 MG tablet Take 4 mg by mouth 3 (three) times daily as needed for muscle spasms.     traZODone  (DESYREL ) 100 MG tablet Take 100 mg by mouth at bedtime.     valsartan (DIOVAN) 40 MG tablet Take 40 mg by mouth daily.     No current facility-administered medications on file prior to visit.    Allergies  Allergen Reactions   Atorvastatin      Memory loss   Opana [Oxymorphone Hcl] Other (See Comments)    hallucinations   Penicillins Hives    Assessment/Plan:  1. Hyperlipidemia -   No problem-specific Assessment & Plan notes found for this encounter.    Thank you,  Clarene Curran D Arbie Blankley, Pharm.JONETTA SARAN, CPP [facility] [address] [place], [place] [address]  Phone: [phone]; Fax: [phone]

## 2024-03-30 ENCOUNTER — Ambulatory Visit: Attending: Neurosurgery

## 2024-03-30 DIAGNOSIS — M542 Cervicalgia: Secondary | ICD-10-CM | POA: Insufficient documentation

## 2024-03-30 DIAGNOSIS — R293 Abnormal posture: Secondary | ICD-10-CM | POA: Diagnosis present

## 2024-03-30 DIAGNOSIS — M6281 Muscle weakness (generalized): Secondary | ICD-10-CM | POA: Insufficient documentation

## 2024-03-30 DIAGNOSIS — R252 Cramp and spasm: Secondary | ICD-10-CM | POA: Diagnosis present

## 2024-03-30 DIAGNOSIS — M5412 Radiculopathy, cervical region: Secondary | ICD-10-CM | POA: Diagnosis present
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
## 2024-04-01 ENCOUNTER — Telehealth: Admitting: Family Medicine

## 2024-04-01 DIAGNOSIS — G8929 Other chronic pain: Secondary | ICD-10-CM

## 2024-04-01 NOTE — Progress Notes (Signed)
 Pt requesting oxycodone  given to her by her neurosurgeon in the past for her neck and thoracic spinal problems. Gabapentin  is not helping. She is advised we do not prescribe any controlled medications and she will need to follow up with them. She understands. DWB

## 2024-04-04 ENCOUNTER — Ambulatory Visit

## 2024-04-04 DIAGNOSIS — M6281 Muscle weakness (generalized): Secondary | ICD-10-CM

## 2024-04-04 DIAGNOSIS — M542 Cervicalgia: Secondary | ICD-10-CM | POA: Diagnosis not present

## 2024-04-04 DIAGNOSIS — R293 Abnormal posture: Secondary | ICD-10-CM

## 2024-04-04 DIAGNOSIS — R252 Cramp and spasm: Secondary | ICD-10-CM

## 2024-04-04 DIAGNOSIS — M5412 Radiculopathy, cervical region: Secondary | ICD-10-CM
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
## 2024-04-06 ENCOUNTER — Ambulatory Visit

## 2024-04-06 DIAGNOSIS — M6281 Muscle weakness (generalized): Secondary | ICD-10-CM

## 2024-04-06 DIAGNOSIS — M542 Cervicalgia: Secondary | ICD-10-CM | POA: Diagnosis not present

## 2024-04-06 DIAGNOSIS — R252 Cramp and spasm: Secondary | ICD-10-CM

## 2024-04-06 DIAGNOSIS — M5412 Radiculopathy, cervical region: Secondary | ICD-10-CM

## 2024-04-06 DIAGNOSIS — R293 Abnormal posture: Secondary | ICD-10-CM
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
## 2024-04-12 ENCOUNTER — Ambulatory Visit: Admitting: Physical Therapy

## 2024-04-12 ENCOUNTER — Telehealth: Payer: Self-pay | Admitting: Physical Therapy

## 2024-04-12 NOTE — Telephone Encounter (Signed)
 Left patient a message about missed appointment. Reminded patient of next scheduled appointment and left a call back number to the clinic.     Kristeen Sar, PT 04/12/24 1:11 PM

## 2024-04-14 ENCOUNTER — Encounter: Payer: Self-pay | Admitting: Physical Therapy

## 2024-04-14 ENCOUNTER — Ambulatory Visit: Admitting: Physical Therapy

## 2024-04-14 DIAGNOSIS — M6281 Muscle weakness (generalized): Secondary | ICD-10-CM

## 2024-04-14 DIAGNOSIS — R293 Abnormal posture: Secondary | ICD-10-CM

## 2024-04-14 DIAGNOSIS — M5412 Radiculopathy, cervical region: Secondary | ICD-10-CM

## 2024-04-14 DIAGNOSIS — M542 Cervicalgia: Secondary | ICD-10-CM | POA: Diagnosis not present

## 2024-04-14 DIAGNOSIS — R252 Cramp and spasm: Secondary | ICD-10-CM
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
## 2024-04-18 ENCOUNTER — Ambulatory Visit

## 2024-04-18 ENCOUNTER — Telehealth: Payer: Self-pay

## 2024-04-18 NOTE — Telephone Encounter (Signed)
 PT called pt due to no-show appt.  Left voicemail for pt to return call.  2nd no-show so will need to discuss attendance.

## 2024-04-20 ENCOUNTER — Ambulatory Visit

## 2024-05-30 ENCOUNTER — Encounter: Payer: Self-pay | Admitting: Radiology

## 2024-06-01 ENCOUNTER — Ambulatory Visit: Admitting: Podiatry

## 2024-06-02 ENCOUNTER — Encounter: Payer: Self-pay | Admitting: Pharmacist

## 2024-06-09 ENCOUNTER — Ambulatory Visit: Admitting: Pharmacist

## 2024-07-01 ENCOUNTER — Telehealth: Payer: Self-pay

## 2024-07-01 NOTE — Telephone Encounter (Signed)
 Attempted to reach patient; unable to speak with patient; left message and number to the office for patient to call back and schedule colonoscopy as recall colon is due;

## 2024-07-04 ENCOUNTER — Encounter: Payer: Self-pay | Admitting: Internal Medicine

## 2024-07-04 NOTE — Telephone Encounter (Signed)
 Patient called office back and scheduled an OV with Ellouise Console in Jan 2026;

## 2024-07-11 ENCOUNTER — Emergency Department (HOSPITAL_COMMUNITY)
Admission: EM | Admit: 2024-07-11 | Discharge: 2024-07-12 | Disposition: A | Discharge status: Home / Self Care | Attending: Emergency Medicine | Admitting: Emergency Medicine

## 2024-07-11 ENCOUNTER — Emergency Department (HOSPITAL_COMMUNITY)

## 2024-07-11 ENCOUNTER — Other Ambulatory Visit: Payer: Self-pay

## 2024-07-11 ENCOUNTER — Encounter (HOSPITAL_COMMUNITY): Payer: Self-pay

## 2024-07-11 DIAGNOSIS — R2689 Other abnormalities of gait and mobility: Secondary | ICD-10-CM | POA: Diagnosis present

## 2024-07-11 DIAGNOSIS — R059 Cough, unspecified: Secondary | ICD-10-CM | POA: Diagnosis present

## 2024-07-11 DIAGNOSIS — R269 Unspecified abnormalities of gait and mobility: Secondary | ICD-10-CM

## 2024-07-11 DIAGNOSIS — I773 Arterial fibromuscular dysplasia: Secondary | ICD-10-CM | POA: Insufficient documentation

## 2024-07-11 DIAGNOSIS — Z9181 History of falling: Secondary | ICD-10-CM | POA: Diagnosis not present

## 2024-07-11 DIAGNOSIS — Z79899 Other long term (current) drug therapy: Secondary | ICD-10-CM | POA: Diagnosis not present

## 2024-07-11 DIAGNOSIS — I2510 Atherosclerotic heart disease of native coronary artery without angina pectoris: Secondary | ICD-10-CM | POA: Diagnosis not present

## 2024-07-11 DIAGNOSIS — I69393 Ataxia following cerebral infarction: Secondary | ICD-10-CM | POA: Insufficient documentation

## 2024-07-11 DIAGNOSIS — Z7982 Long term (current) use of aspirin: Secondary | ICD-10-CM | POA: Diagnosis not present

## 2024-07-11 DIAGNOSIS — I639 Cerebral infarction, unspecified: Secondary | ICD-10-CM | POA: Diagnosis present

## 2024-07-11 DIAGNOSIS — M5416 Radiculopathy, lumbar region: Secondary | ICD-10-CM | POA: Diagnosis not present

## 2024-07-11 LAB — ETHANOL: Alcohol, Ethyl (B): <15 mg/dL (ref <15)

## 2024-07-11 LAB — DIFFERENTIAL
Abs Immature Granulocytes: 0.02 K/uL (ref 0.00–0.07)
Basophils Absolute: 0 K/uL (ref 0.0–0.1)
Basophils Relative: 1 %
Eosinophils Absolute: 0.1 K/uL (ref 0.0–0.5)
Eosinophils Relative: 2 %
Immature Granulocytes: 0 %
Lymphocytes Relative: 24 %
Lymphs Abs: 1.7 K/uL (ref 0.7–4.0)
Monocytes Absolute: 0.8 K/uL (ref 0.1–1.0)
Monocytes Relative: 11 %
Neutro Abs: 4.3 K/uL (ref 1.7–7.7)
Neutrophils Relative %: 62 %

## 2024-07-11 LAB — COMPREHENSIVE METABOLIC PANEL WITH GFR
ALT: 17 U/L (ref 0–44)
AST: 22 U/L (ref 15–41)
Albumin: 3.6 g/dL (ref 3.5–5.0)
Alkaline Phosphatase: 104 U/L (ref 38–126)
Anion gap: 7 (ref 5–15)
BUN: 26 mg/dL — ABNORMAL HIGH (ref 8–23)
CO2: 30 mmol/L (ref 22–32)
Calcium: 9 mg/dL (ref 8.9–10.3)
Chloride: 101 mmol/L (ref 98–111)
Creatinine, Ser: 0.92 mg/dL (ref 0.44–1.00)
GFR, Estimated: >60 mL/min (ref >60)
Glucose, Bld: 99 mg/dL (ref 70–99)
Potassium: 4.4 mmol/L (ref 3.5–5.1)
Sodium: 138 mmol/L (ref 135–145)
Total Bilirubin: 1 mg/dL (ref 0.0–1.2)
Total Protein: 6.3 g/dL — ABNORMAL LOW (ref 6.5–8.1)

## 2024-07-11 LAB — I-STAT VENOUS BLOOD GAS, ED
Acid-Base Excess: 6 mmol/L — ABNORMAL HIGH (ref 0.0–2.0)
Bicarbonate: 30.6 mmol/L — ABNORMAL HIGH (ref 20.0–28.0)
Calcium, Ion: 1.06 mmol/L — ABNORMAL LOW (ref 1.15–1.40)
HCT: 41 % (ref 36.0–46.0)
Hemoglobin: 13.9 g/dL (ref 12.0–15.0)
O2 Saturation: 76 %
Potassium: 4.5 mmol/L (ref 3.5–5.1)
Sodium: 138 mmol/L (ref 135–145)
TCO2: 32 mmol/L (ref 22–32)
pCO2, Ven: 42.4 mmHg — ABNORMAL LOW (ref 44–60)
pH, Ven: 7.466 — ABNORMAL HIGH (ref 7.25–7.43)
pO2, Ven: 38 mmHg (ref 32–45)

## 2024-07-11 LAB — I-STAT CHEM 8, ED
BUN: 30 mg/dL — ABNORMAL HIGH (ref 8–23)
Calcium, Ion: 1.07 mmol/L — ABNORMAL LOW (ref 1.15–1.40)
Chloride: 102 mmol/L (ref 98–111)
Creatinine, Ser: 1 mg/dL (ref 0.44–1.00)
Glucose, Bld: 95 mg/dL (ref 70–99)
HCT: 42 % (ref 36.0–46.0)
Hemoglobin: 14.3 g/dL (ref 12.0–15.0)
Potassium: 4.5 mmol/L (ref 3.5–5.1)
Sodium: 139 mmol/L (ref 135–145)
TCO2: 30 mmol/L (ref 22–32)

## 2024-07-11 LAB — CBC
HCT: 42.4 % (ref 36.0–46.0)
Hemoglobin: 13.1 g/dL (ref 12.0–15.0)
MCH: 28 pg (ref 26.0–34.0)
MCHC: 30.9 g/dL (ref 30.0–36.0)
MCV: 90.6 fL (ref 80.0–100.0)
Platelets: 201 K/uL (ref 150–400)
RBC: 4.68 MIL/uL (ref 3.87–5.11)
RDW: 15.4 % (ref 11.5–15.5)
WBC: 7 K/uL (ref 4.0–10.5)
nRBC: 0 % (ref 0.0–0.2)

## 2024-07-11 LAB — PROTIME-INR
INR: 1 (ref 0.8–1.2)
Prothrombin Time: 14 s (ref 11.4–15.2)

## 2024-07-11 LAB — APTT: aPTT: 25 s (ref 24–36)

## 2024-07-11 LAB — CBG MONITORING, ED: Glucose-Capillary: 98 mg/dL (ref 70–99)

## 2024-07-11 NOTE — ED Triage Notes (Signed)
 Pt comming in from home with an abnormal gait. Pt family reports not seeing her today. Pt reports that her gait is different when he saw her this evening. Pt last known well 07/09/24. Pt lives in the basement of a friends house. Pt called a friend and said she thinks she is having a stroke. Pt is alert and oriented slow to answer and is reporting that she is having trouble remembering things.  Hr 80 93% RA  136/78 Cbg 167   20 left fa

## 2024-07-11 NOTE — ED Provider Triage Note (Signed)
 Emergency Medicine Provider Triage Evaluation Note  Dorothy White , a 73 y.o. female  was evaluated in triage.  Pt complains of trouble walking.  She is not the clearest on when this started but she estimates she last felt normal this morning around breakfast time.  Perhaps as late as noon (it is currently 10 PM).  She felt generally weak but no focal weakness.  She has had a cough for couple days.  Review of Systems  Positive: Trouble walking, cough Negative: Headache, shortness of breath  Physical Exam  BP 138/78 (BP Location: Right Arm)   Pulse 80   Temp 98.8 F (37.1 C)   Resp 19   Ht 5' 5 (1.651 m)   Wt 61 kg   SpO2 91%   BMI 22.38 kg/m  Gen:   Awake, no distress Resp:  Normal effort  Neuro:  Grossly intact strength to all 4 extremities.  She has a slow shuffling gait that is new for her.  Medical Decision Making  Medically screening exam initiated at 10:09 PM.  Appropriate orders placed.  Dorothy White was informed that the remainder of the evaluation will be completed by another provider, this initial triage assessment does not replace that evaluation, and the importance of remaining in the ED until their evaluation is complete.  Outside of any code stroke/TNK window.  Exam not consistent with LVO.  Will start stroke workup and get a chest x-ray and respiratory panel for the cough.   Freddi Hamilton, MD 07/11/24 2212

## 2024-07-12 ENCOUNTER — Emergency Department (HOSPITAL_COMMUNITY)

## 2024-07-12 LAB — URINALYSIS, ROUTINE W REFLEX MICROSCOPIC
Bacteria, UA: NONE SEEN
Bilirubin Urine: NEGATIVE
Glucose, UA: NEGATIVE mg/dL
Hgb urine dipstick: NEGATIVE
Ketones, ur: 5 mg/dL — AB
Nitrite: NEGATIVE
Protein, ur: 30 mg/dL — AB
Specific Gravity, Urine: 1.029 (ref 1.005–1.030)
pH: 6 (ref 5.0–8.0)

## 2024-07-12 LAB — RAPID URINE DRUG SCREEN, HOSP PERFORMED
Amphetamines: NOT DETECTED — AB
Barbiturates: NOT DETECTED — AB
Benzodiazepines: NOT DETECTED — AB
Cocaine: NOT DETECTED — AB
Opiates: POSITIVE — AB
Tetrahydrocannabinol: NOT DETECTED — AB

## 2024-07-12 LAB — RESP PANEL BY RT-PCR (RSV, FLU A&B, COVID)  RVPGX2
Influenza A by PCR: NEGATIVE
Influenza B by PCR: NEGATIVE
Resp Syncytial Virus by PCR: NEGATIVE
SARS Coronavirus 2 by RT PCR: NEGATIVE

## 2024-07-12 NOTE — ED Notes (Signed)
 Transport to CT

## 2024-07-12 NOTE — ED Provider Notes (Signed)
 [facility] AT [facility] Provider Note   CSN: [id number] Arrival date & time: 07/11/24  2137     Patient presents with: Gait Problem   Dorothy White is a 73 y.o. female.   The history is provided by the patient and medical records.   73 year old female with history of coronary artery disease, fibromuscular dysplasia, history of ataxia, history of TIA, presenting to the ED with trouble walking.  Patient had a stroke in 2016, has been having some ataxia since that time.  She ambulates with a cane at baseline.  States recently she has felt a little more wobbly when walking.  She denies any focal weakness.  She has had some mild falls but denies any injuries from these.  She does live in a two-story dwelling, her landlord lives upstairs now so she only uses the bottom floor eliminating her having to traverse the stairs.  She denies any changes in speech, visual disturbance, etc.  She does feel safe in her current housing situation.  Used to have PT in the home but has not had this in several years.  Prior to Admission medications  Medication Sig Start Date End Date Taking? Authorizing Provider  acetaminophen  (TYLENOL ) 500 MG tablet Take 1,000 mg by mouth every 8 (eight) hours as needed for headache (pain).    [provider]  amoxicillin -clavulanate (AUGMENTIN ) 875-125 MG tablet Take 1 tablet by mouth every 12 (twelve) hours. 11/28/22   Neldon Hamp RAMAN, PA  amoxicillin -clavulanate (AUGMENTIN ) 875-125 MG tablet Take 1 tablet by mouth every 12 (twelve) hours. 03/16/24   Zackowski, Scott, MD  aspirin  EC 81 MG tablet Take 1 tablet (81 mg total) by mouth daily. Swallow whole. 12/16/21   Thukkani, Arun K, MD  atorvastatin  (LIPITOR) 80 MG tablet TAKE 1 TABLET BY MOUTH EVERY DAY 02/12/24   Thukkani, Arun K, MD  cetirizine  (ZYRTEC ) 10 MG tablet Take 10 mg by mouth at bedtime.    [provider]  docusate sodium  (COLACE) 100 MG capsule Take 1 capsule (100  mg total) by mouth daily as needed for mild constipation. 01/23/22   Cheryle Page, MD  ferrous sulfate  325 (65 FE) MG tablet Take 1 tablet (325 mg total) by mouth daily with breakfast. 01/24/22   Cheryle Page, MD  FLUoxetine  (PROZAC ) 40 MG capsule Take 80 mg by mouth every morning. 04/18/18   [provider]  fluticasone  (FLONASE ) 50 MCG/ACT nasal spray Place 2 sprays into both nostrils daily for 14 days. Patient taking differently: Place 2 sprays into both nostrils daily as needed for allergies. 11/28/22 03/01/24  Neldon Hamp RAMAN, PA  furosemide (LASIX) 20 MG tablet Take 20 mg by mouth daily as needed for fluid or edema (ankle swelling). 10/25/21   [provider]  gabapentin  (NEURONTIN ) 100 MG capsule Take 300 mg by mouth 3 (three) times daily.    [provider]  mirtazapine  (REMERON ) 15 MG tablet TAKE 1 TABLET BY MOUTH EVERYDAY AT BEDTIME 10/28/21   Tat, Asberry RAMAN, DO  omeprazole  (PRILOSEC) 20 MG capsule Take 20 mg by mouth every morning.    [provider]  oxyCODONE -acetaminophen  (PERCOCET/ROXICET) 5-325 MG tablet Take 1 tablet by mouth every 6 (six) hours as needed for severe pain (pain score 7-10). 03/16/24   Zackowski, Scott, MD  promethazine  (PHENERGAN ) 12.5 MG tablet Take 12.5 mg by mouth every 8 (eight) hours as needed for vomiting or nausea.    [provider]  propranolol  (INDERAL ) 10 MG tablet  Take 1 tablet (10 mg total) by mouth 2 (two) times daily. 04/22/22   Rancour, Garnette, MD  SUMAtriptan  (IMITREX ) 100 MG tablet Take 50-100 mg by mouth daily as needed for migraine. 12/24/18   [provider]  tiZANidine  (ZANAFLEX ) 4 MG tablet Take 4 mg by mouth 3 (three) times daily as needed for muscle spasms. 04/10/22   [provider]  traZODone  (DESYREL ) 100 MG tablet Take 100 mg by mouth at bedtime. 09/25/21   [provider]  valsartan (DIOVAN) 40 MG tablet Take 40 mg by mouth daily.    [provider]    Allergies:  Atorvastatin , Opana [oxymorphone hcl], and Penicillins    Review of Systems  Musculoskeletal:  Positive for gait problem.  All other systems reviewed and are negative.   Updated Vital Signs BP 138/78 (BP Location: Right Arm)   Pulse 80   Temp 98.8 F (37.1 C)   Resp 19   Ht 5' 5 (1.651 m)   Wt 61 kg   SpO2 91%   BMI 22.38 kg/m   Physical Exam Vitals and nursing note reviewed.  Constitutional:      Appearance: She is well-developed.  HENT:     Head: Normocephalic and atraumatic.  Eyes:     Conjunctiva/sclera: Conjunctivae normal.     Pupils: Pupils are equal, round, and reactive to light.  Cardiovascular:     Rate and Rhythm: Normal rate and regular rhythm.     Heart sounds: Normal heart sounds.  Pulmonary:     Effort: Pulmonary effort is normal.     Breath sounds: Normal breath sounds.  Abdominal:     General: Bowel sounds are normal.     Palpations: Abdomen is soft.  Musculoskeletal:        General: Normal range of motion.     Cervical back: Normal range of motion.  Skin:    General: Skin is warm and dry.  Neurological:     Mental Status: She is alert and oriented to person, place, and time.     Comments: AAOx3, answering questions and following commands appropriately; equal strength UE and LE bilaterally; CN grossly intact; moves all extremities appropriately without ataxia; no focal neuro deficits or facial asymmetry appreciated, able to ambulate to restroom here with minimal assistance     (all labs ordered are listed, but only abnormal results are displayed) Labs Reviewed  COMPREHENSIVE METABOLIC PANEL WITH GFR - Abnormal; Notable for the following components:      Result Value   BUN 26 (*)    Total Protein 6.3 (*)    All other components within normal limits  RAPID URINE DRUG SCREEN, HOSP PERFORMED - Abnormal; Notable for the following components:   Opiates POSITIVE (*)    Cocaine NONE DETECTED (*)    Benzodiazepines NONE DETECTED (*)    Amphetamines  NONE DETECTED (*)    Tetrahydrocannabinol NONE DETECTED (*)    Barbiturates NONE DETECTED (*)    All other components within normal limits  URINALYSIS, ROUTINE W REFLEX MICROSCOPIC - Abnormal; Notable for the following components:   Ketones, ur 5 (*)    Protein, ur 30 (*)    Leukocytes,Ua TRACE (*)    All other components within normal limits  I-STAT CHEM 8, ED - Abnormal; Notable for the following components:   BUN 30 (*)    Calcium , Ion 1.07 (*)    All other components within normal limits  I-STAT VENOUS BLOOD GAS, ED - Abnormal; Notable for the  following components:   pH, Ven 7.466 (*)    pCO2, Ven 42.4 (*)    Bicarbonate 30.6 (*)    Acid-Base Excess 6.0 (*)    Calcium , Ion 1.06 (*)    All other components within normal limits  RESP PANEL BY RT-PCR (RSV, FLU A&B, COVID)  RVPGX2  PROTIME-INR  APTT  CBC  DIFFERENTIAL  ETHANOL  CBG MONITORING, ED    EKG: EKG Interpretation Date/Time:  Tuesday July 12 2024 00:01:41 EST Ventricular Rate:  72 PR Interval:  144 QRS Duration:  70 QT Interval:  394 QTC Calculation: 431 R Axis:   72  Text Interpretation: Normal sinus rhythm Low voltage QRS Nonspecific ST and T wave abnormality Abnormal ECG Confirmed by Griselda Norris [id number]) on 07/12/2024 2:00:43 AM  Radiology: MR Brain Wo Contrast (neuro protocol) Result Date: 07/12/2024 EXAM: MRI BRAIN WITHOUT CONTRAST 07/12/2024 02:46:59 AM TECHNIQUE: Multiplanar multisequence MRI of the head/brain was performed without the administration of intravenous contrast. COMPARISON: Comparison made with prior CT from 07/11/2024. CLINICAL HISTORY: Neuro deficit, acute, stroke suspected; difficulty walking, confusion. FINDINGS: BRAIN AND VENTRICLES: No acute infarct. No intracranial hemorrhage. No mass. No midline shift. No hydrocephalus. Few scattered patchy foci of T2/FLAIR signal abnormalities are seen involving the supratentorial cerebral white matter. These are nonspecific but most commonly  related to chronic microvascular ischemic disease. The changes are mild for age. The sella is unremarkable. Normal flow voids. ORBITS: Prior bilateral acute lens placement. SINUSES AND MASTOIDS: No acute abnormality. BONES AND SOFT TISSUES: Normal marrow signal. No acute soft tissue abnormality. IMPRESSION: 1. No acute intracranial abnormality. 2. Mild cerebral white matter disease, most likely related to chronic microvascular ischemic disease. Electronically signed by: Morene Hoard MD 07/12/2024 02:59 AM EST RP Workstation: [id number]   CT HEAD WO CONTRAST Result Date: 07/11/2024 EXAM: CT HEAD WITHOUT 07/11/2024 11:17:16 PM TECHNIQUE: CT of the head was performed without the administration of intravenous contrast. Automated exposure control, iterative reconstruction, and/or weight based adjustment of the mA/kV was utilized to reduce the radiation dose to as low as reasonably achievable. COMPARISON: 11/28/2022 CLINICAL HISTORY: Neuro deficit, acute, stroke suspected FINDINGS: BRAIN AND VENTRICLES: No acute intracranial hemorrhage. No mass effect or midline shift. No extra-axial fluid collection. No evidence of acute infarct. ORBITS: No acute abnormality. SINUSES AND MASTOIDS: No acute abnormality. SOFT TISSUES AND SKULL: No acute skull fracture. No acute soft tissue abnormality. IMPRESSION: 1. No acute intracranial abnormality. Electronically signed by: Oneil Devonshire MD 07/11/2024 11:24 PM EST RP Workstation: [id number]   DG Chest 2 View Result Date: 07/11/2024 EXAM: 2 VIEW(S) XRAY OF THE CHEST 07/11/2024 10:30:07 PM COMPARISON: 10/03/2022 CLINICAL HISTORY: cough FINDINGS: LUNGS AND PLEURA: Mild bilateral lower lung scarring/atelectasis. No pleural effusion. No pneumothorax. HEART AND MEDIASTINUM: No acute abnormality of the cardiac and mediastinal silhouettes. BONES AND SOFT TISSUES: Status post ORIF of the left shoulder. No acute osseous abnormality. IMPRESSION: 1. No acute findings. Electronically  signed by: Pinkie Pebbles MD 07/11/2024 10:37 PM EST RP Workstation: [id number]     Procedures   Medications Ordered in the ED - No data to display                                  Medical Decision Making Amount and/or Complexity of Data Reviewed Labs: ordered. Radiology: ordered and independent interpretation performed. ECG/medicine tests: ordered and independent interpretation performed.   73 year old female here with gait disturbance.  She has a longstanding  history of ataxia documented on chart review.  This seems to be a combination of her prior stroke as well as her chronic lumbar radiculopathy.  She is currently in pain management for this.  She ambulates with a cane at baseline.  She is afebrile and nontoxic in appearance here.  She does not have any focal neurologic deficits.  She was able to ambulate here with minimal assistance.  Labs are reassuring without leukocytosis or electrolyte derangement.  UA with trace leuks but no significant bacteria, negative nitrates.  CT head negative.  MRI also negative for acute abnormality.  Patient has complained of back pain while here.  This is a chronic issue for her for which she takes regular oxycodone  and is enrolled in pain management.  Some of her issues may be pain related.  I discussed her current living situation with her at length, she is confined to downstairs only now, landlord does live upstairs and checks on her frequently.  She does not feel unsafe in this arrangement.  She is comfortable going back home.  I will refer her to neurology locally as it appears she has been lost to follow-up.  Can continue to follow-up with PCP and pain management as well.  Return here for new concerns.  Final diagnoses:  Gait difficulty  Abnormal gait    ED Discharge Orders          Ordered    Ambulatory referral to Neurology       Comments: An appointment is requested in approximately: 8 weeks   07/12/24 0327                Jarold Olam HERO, PA-C 07/12/24 [id number]    Griselda Norris, MD 07/14/24 [phone]

## 2024-07-12 NOTE — Discharge Instructions (Addendum)
 As we discussed MRI today without acute stroke. I have placed a referral to set you back up with neurology. Recommend that you see your primary care doctor in the interim. Return to the ED for new or worsening symptoms.

## 2024-07-12 NOTE — ED Notes (Signed)
 Rn ambulated pt to bathroom. Pt reports that her gait feels more normal

## 2024-08-10 NOTE — Progress Notes (Signed)
 "     Ellouise Console, PA-C [address] [place], [place]  [address] Phone: [phone]   Gastroenterology Consultation  Referring Provider:     Dyane Anthony RAMAN, FNP Primary Care Physician:  Dyane Anthony RAMAN, FNP Primary Gastroenterologist:  Ellouise Console, PA-C / Norleen Kiang, MD  Reason for Consultation:     GERD, hiatal hernia, Discuss repeat colonoscopy.        HPI:   Discussed the use of AI scribe software for clinical note transcription with the patient, who gave verbal consent to proceed.  11/2013 last colonoscopy by Dr. Kiang: Severe pandiverticulosis.  Otherwise normal with no polyps.  10-year repeat.  11/2013 last EGD by Dr. Kiang (for GERD and mild dysphagia): Normal.  Small hiatal hernia.  History of Present Illness Dorothy White is a 74 year old female with gastroesophageal reflux disease and suspected hiatal hernia who presents for evaluation of chronic upper gastrointestinal symptoms and to schedule a repeat screening colonoscopy.  Upper Gastrointestinal Symptoms: - Six months of worsening acid reflux on PPI - Severe indigestion - Frequent hiccups and excessive burping - Regurgitation of food, described as the food comes up and it's not pleasant - Sensation of food getting stuck localized to the throat during swallowing - Nausea without vomiting  Acid Suppression Therapy: - Omeprazole  20 mg used for over 10 years, dosing varies between one and two tablets daily - Symptoms remain inadequately controlled - No other acid reflux medications have been tried  Lower Gastrointestinal Symptoms: - Alternating diarrhea and constipation - No abdominal pain - No blood in stool - Weight gain present, no unintentional weight loss  Cancer Risk Factors: - No known family history of colon, stomach, or esophageal cancer - Takes baby aspirin , no other blood thinners  PMH:  CAD, carotid artery stenosis, PSVT, TIA, diverticulosis, GERD, constipation, right  hemiparesis, subdural hematoma, tobacco use disorder.  Takes 81 mg aspirin .  No other blood thinners.  12/2021 echo LVEF 60 to 65%.  Cardiologist Dr.   Past Medical History:  Diagnosis Date   Anemia    Anxiety    Arrhythmia 2018   Arthritis    everywhere (04/25/2016)   Basal cell carcinoma of left nasal tip    CAD (coronary artery disease) 10/18/2023   Lex MPI 01/03/16: no ischemia, low risk     LHC 02/20/16:  mLAD 25, EF 55-65    TTE 12/31/21: EF 60-65, no RWMA, Gr 1 DD, NL RVSF, mildly elevated PASP, RVSP 37.6    CCTA 03/12/22: pLAD 25-49, CAC score 0 (1st percentile), nonobstructive CAD     Chronic back pain    all over (04/25/2016)   Constipation    Depression    Diverticulitis    Diverticulitis of sigmoid colon 04/25/2016   GERD (gastroesophageal reflux disease)    Head injury 2019   subdural hematoma   Headache    weekly (04/25/2016)   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 25% mLAD, otherwise no sig disease 01/2016 cath.   Myocardial infarction (HCC) 2018   NICM (nonischemic cardiomyopathy) (HCC)    a. EF 40-45% by echo 11/2015 at time of complicated migraine/neuro sx, 55-65% at time of cath 01/2016   Pneumonia    PSVT (paroxysmal supraventricular tachycardia) 10/18/2023   Stroke (  HCC) 06/2013   mini stroke , ?possibly hemaplegic migraine per pt    Past Surgical History:  Procedure Laterality Date   ANTERIOR APPROACH HEMI HIP ARTHROPLASTY Left 04/01/2017   Procedure: LEFT DIRECT ANTERIOR TOTAL HIP REPLACEMENT;  Surgeon: Jerri Kay HERO, MD;  Location: [facility] OR;  Service: Orthopedics;  Laterality: Left;  LEFT DIRECT ANTERIOR TOTAL HIP REPLACEMENT   AUGMENTATION MAMMAPLASTY  1980   BASAL CELL CARCINOMA EXCISION     tip of my nose   BUNIONECTOMY Bilateral 10/2003   CARDIAC CATHETERIZATION N/A  02/20/2016   Procedure: Left Heart Cath and Coronary Angiography;  Surgeon: Candyce GORMAN Reek, MD;  Location: [facility] INVASIVE CV LAB;  Service: Cardiovascular;  Laterality: N/A;   COLONOSCOPY     DILATION AND CURETTAGE OF UTERUS     FINGER CLOSED REDUCTION Right 12/10/2022   Procedure: CLOSED REDUCTION METACARPAL (FINGER) right small finger and percutaneous skeletal fixation;  Surgeon: Shari Easter, MD;  Location: [facility] OR;  Service: Orthopedics;  Laterality: Right;  regional and Iv Sedation   FRACTURE SURGERY     IR GENERIC HISTORICAL  03/18/2016   IR SINUS/FIST TUBE CHK-NON GI 03/18/2016 Marcey Moan, MD [facility]   LAPAROSCOPIC SIGMOID COLECTOMY N/A 04/25/2016   Procedure: LAPAROSCOPIC SIGMOID COLECTOMY;  Surgeon: Jina Nephew, MD;  Location: [facility] OR;  Service: General;  Laterality: N/A;   OOPHORECTOMY Bilateral ~ 1999   ORIF HUMERUS FRACTURE Right 02/15/2015   Procedure: OPEN REDUCTION INTERNAL FIXATION (ORIF) PROXIMAL HUMERUS FRACTURE;  Surgeon: Marcey Her, MD;  Location: [facility] OR;  Service: Orthopedics;  Laterality: Right;   ORIF HUMERUS FRACTURE Left 04/04/2020   ORIF HUMERUS FRACTURE Left 04/04/2020   Procedure: OPEN REDUCTION INTERNAL FIXATION (ORIF) PROXIMAL HUMERUS FRACTURE;  Surgeon: Shari Easter, MD;  Location: [facility] OR;  Service: Orthopedics;  Laterality: Left;  regional block   ORIF ULNAR FRACTURE Left 04/04/2020   ORIF ULNAR FRACTURE Left 04/04/2020   Procedure: OPEN REDUCTION INTERNAL FIXATION (ORIF) ULNAR FRACTURE;  Surgeon: Shari Easter, MD;  Location: [facility] OR;  Service: Orthopedics;  Laterality: Left;  with regional block   RHINOPLASTY  1976   TONSILLECTOMY     TOTAL HIP ARTHROPLASTY Right 08/11/2020   Procedure: TOTAL HIP ARTHROPLASTY ANTERIOR APPROACH;  Surgeon: Fidel Rogue, MD;  Location: [facility] ORS;  Service: Orthopedics;  Laterality: Right;   TOTAL HIP ARTHROPLASTY Left    TOTAL SHOULDER REPLACEMENT Bilateral    TUBAL LIGATION  ~ 1983   VAGINAL HYSTERECTOMY  ~ 1998     Prior to Admission medications  Medication Sig Start Date End Date Taking? Authorizing Provider  acetaminophen  (TYLENOL ) 500 MG tablet Take 1,000 mg by mouth every 8 (eight) hours as needed for headache (pain).    [provider]  amoxicillin -clavulanate (AUGMENTIN ) 875-125 MG tablet Take 1 tablet by mouth every 12 (twelve) hours. 11/28/22   Neldon Hamp GORMAN, PA  amoxicillin -clavulanate (AUGMENTIN ) 875-125 MG tablet Take 1 tablet by mouth every 12 (twelve) hours. 03/16/24   Zackowski, Scott, MD  aspirin  EC 81 MG tablet Take 1 tablet (81 mg total) by mouth daily. Swallow whole. 12/16/21   Thukkani, Arun K, MD  atorvastatin  (LIPITOR) 80 MG tablet TAKE 1 TABLET BY MOUTH EVERY DAY 02/12/24   Thukkani, Arun K, MD  cetirizine  (ZYRTEC ) 10 MG tablet Take 10 mg by mouth at bedtime.    [provider]  docusate sodium  (COLACE) 100 MG capsule Take 1 capsule (100 mg total) by mouth daily as needed for mild constipation. 01/23/22   Cheryle Page,  MD  ferrous sulfate  325 (65 FE) MG tablet Take 1 tablet (325 mg total) by mouth daily with breakfast. 01/24/22   Cheryle Page, MD  FLUoxetine  (PROZAC ) 40 MG capsule Take 80 mg by mouth every morning. 04/18/18   [provider]  fluticasone  (FLONASE ) 50 MCG/ACT nasal spray Place 2 sprays into both nostrils daily for 14 days. Patient taking differently: Place 2 sprays into both nostrils daily as needed for allergies. 11/28/22 03/01/24  Neldon Hamp RAMAN, PA  furosemide (LASIX) 20 MG tablet Take 20 mg by mouth daily as needed for fluid or edema (ankle swelling). 10/25/21   [provider]  gabapentin  (NEURONTIN ) 100 MG capsule Take 300 mg by mouth 3 (three) times daily.    [provider]  mirtazapine  (REMERON ) 15 MG tablet TAKE 1 TABLET BY MOUTH EVERYDAY AT BEDTIME 10/28/21   Tat, Asberry RAMAN, DO  omeprazole  (PRILOSEC) 20 MG capsule Take 20 mg by mouth every morning.    [provider]  oxyCODONE -acetaminophen   (PERCOCET/ROXICET) 5-325 MG tablet Take 1 tablet by mouth every 6 (six) hours as needed for severe pain (pain score 7-10). 03/16/24   Zackowski, Scott, MD  promethazine  (PHENERGAN ) 12.5 MG tablet Take 12.5 mg by mouth every 8 (eight) hours as needed for vomiting or nausea.    [provider]  propranolol  (INDERAL ) 10 MG tablet Take 1 tablet (10 mg total) by mouth 2 (two) times daily. 04/22/22   Rancour, Garnette, MD  SUMAtriptan  (IMITREX ) 100 MG tablet Take 50-100 mg by mouth daily as needed for migraine. 12/24/18   [provider]  tiZANidine  (ZANAFLEX ) 4 MG tablet Take 4 mg by mouth 3 (three) times daily as needed for muscle spasms. 04/10/22   [provider]  traZODone  (DESYREL ) 100 MG tablet Take 100 mg by mouth at bedtime. 09/25/21   [provider]  valsartan (DIOVAN) 40 MG tablet Take 40 mg by mouth daily.    [provider]    Family History  Problem Relation Age of Onset   Lung cancer Mother 94   Migraines Mother    Heart attack Father        Vague   Hypertension Brother    Esophageal cancer Brother    Diabetes Paternal Grandmother        questionable   Anemia Brother        Afib   Colon cancer Neg Hx    Kidney disease Neg Hx    Liver disease Neg Hx      Social History[1]  Allergies as of 08/11/2024 - Review Complete 08/11/2024  Allergen Reaction Noted   Atorvastatin   12/08/2022   Opana [oxymorphone hcl] Other (See Comments) 06/29/2017   Penicillins Hives 03/24/2007    Review of Systems:    All systems reviewed and negative except where noted in HPI.   Physical Exam:  BP 110/70   Pulse 85   Ht 5' 5 (1.651 m)   Wt 145 lb 4 oz (65.9 kg)   BMI 24.17 kg/m  No LMP recorded. Patient has had a hysterectomy.  General:   Alert,  Well-developed, well-nourished, pleasant and cooperative in NAD Lungs:  Respirations even and unlabored.  Clear throughout to auscultation.   No wheezes, crackles, or rhonchi. No acute distress. Heart:   Regular rate and rhythm; no murmurs, clicks, rubs, or gallops. Abdomen:  Normal bowel sounds.  No bruits.  Soft, and non-distended without masses, hepatosplenomegaly or hernias noted.  Mild epigastric tenderness.  Rest of abdomen is  not tender.  No guarding or rebound tenderness.    Neurologic:  Alert and oriented x3;  grossly normal neurologically. Psych:  Alert and cooperative. Normal mood and affect.   Imaging Studies: No results found.   Labs: CBC    Component Value Date/Time   WBC 7.0 07/11/2024 2235   RBC 4.68 07/11/2024 2235   HGB 14.3 07/11/2024 2248   HGB 13.9 07/11/2024 2248   HCT 42.0 07/11/2024 2248   HCT 41.0 07/11/2024 2248   PLT 201 07/11/2024 2235   MCV 90.6 07/11/2024 2235    CMP     Component Value Date/Time   NA 139 07/11/2024 2248   NA 138 07/11/2024 2248   NA 141 12/16/2021 1433   K 4.5 07/11/2024 2248   K 4.5 07/11/2024 2248   CL 102 07/11/2024 2248   CO2 30 07/11/2024 2235   GLUCOSE 95 07/11/2024 2248   BUN 30 (H) 07/11/2024 2248   BUN 15 12/16/2021 1433   CREATININE 1.00 07/11/2024 2248   CREATININE 0.95 02/15/2016 1536   CALCIUM  9.0 07/11/2024 2235   CALCIUM  9.4 01/13/2007 2356   PROT 6.3 (L) 07/11/2024 2235   PROT 6.7 09/22/2022 1431   ALBUMIN  3.6 07/11/2024 2235   ALBUMIN  4.6 09/22/2022 1431   AST 22 07/11/2024 2235   ALT 17 07/11/2024 2235   ALKPHOS 104 07/11/2024 2235   BILITOT 1.0 07/11/2024 2235   BILITOT 0.4 09/22/2022 1431   GFRNONAA >60 07/11/2024 2235   GFRAA >60 04/04/2020 1212    Assessment and Plan:   Dorothy White is a 74 y.o. y/o female has been referred for: Assessment & Plan 1.  Colorectal cancer screening Due for colorectal cancer screening; - Scheduling Colonoscopy I discussed risks of colonoscopy with patient to include risk of bleeding, colon perforation, and risk of sedation.  Patient expressed understanding and agrees to proceed with colonoscopy.   2.  Chronic gastroesophageal reflux disease with  hiatal hernia Chronic reflux symptoms inadequately controlled on omeprazole . Repeat endoscopy indicated. - Scheduling EGD to screen for Barrett's -  I discussed risks of EGD with patient to include risk of bleeding, perforation, and risk of sedation.  Patient expressed understanding and agrees to proceed with EGD.  - Discontinued omeprazole . - Prescribed pantoprazole  40 mg once daily, take 30 minutes before meals. - May use famotidine  (Pepcid ) and calcium  carbonate (Tums) over the counter for breakthrough symptoms if needed. - Recommend Lifestyle Modifications to prevent Acid Reflux.  Rec. Avoid coffee, sodas, peppermint, garlic, onions, alcohol , citrus fruits, chocolate, tomatoes, fatty and spicey foods.  Avoid eating 2-3 hours before bedtime.    Follow up as needed based on procedure results and GI symptoms.  Ellouise Console, PA-C       [1]  Social History Tobacco Use   Smoking status: Every Day    Current packs/day: 0.25    Average packs/day: 0.3 packs/day for 34.0 years (8.5 ttl pk-yrs)    Types: Cigarettes   Smokeless tobacco: Never  Vaping Use   Vaping status: Never Used  Substance Use Topics   Alcohol  use: Not Currently    Alcohol /week: 1.0 standard drink of alcohol     Types: 1 Standard drinks or equivalent per week    Comment: occ    Drug use: No    Comment: Denies any drug use 02/19/18   "

## 2024-08-11 ENCOUNTER — Ambulatory Visit: Admitting: Physician Assistant

## 2024-08-11 ENCOUNTER — Encounter: Payer: Self-pay | Admitting: Physician Assistant

## 2024-08-11 VITALS — BP 110/70 | HR 85 | Ht 65.0 in | Wt 145.2 lb

## 2024-08-11 DIAGNOSIS — Z1211 Encounter for screening for malignant neoplasm of colon: Secondary | ICD-10-CM

## 2024-08-11 DIAGNOSIS — K449 Diaphragmatic hernia without obstruction or gangrene: Secondary | ICD-10-CM | POA: Diagnosis not present

## 2024-08-11 DIAGNOSIS — K219 Gastro-esophageal reflux disease without esophagitis: Secondary | ICD-10-CM | POA: Diagnosis not present

## 2024-08-11 DIAGNOSIS — R1310 Dysphagia, unspecified: Secondary | ICD-10-CM

## 2024-08-11 MED ORDER — PANTOPRAZOLE SODIUM 40 MG PO TBEC: 40.0000 mg | DELAYED_RELEASE_TABLET | Freq: Every day | ORAL | 3 refills | Status: AC

## 2024-08-11 MED ORDER — NA SULFATE-K SULFATE-MG SULF 17.5-3.13-1.6 GM/177ML PO SOLN: 1.0000 | Freq: Once | ORAL | 0 refills | Status: AC

## 2024-08-11 NOTE — Progress Notes (Signed)
 Noted

## 2024-08-11 NOTE — Patient Instructions (Addendum)
 Stop Omeprazole .  We have sent the following medications to your pharmacy for you to pick up at your convenience: Pantoprazole  40 mg.  GERD diet handout given.  You have been scheduled for an endoscopy and colonoscopy. Please follow the written instructions given to you at your visit today.  If you use inhalers (even only as needed), please bring them with you on the day of your procedure.  DO NOT TAKE 7 DAYS PRIOR TO TEST- Trulicity (dulaglutide) Ozempic, Wegovy (semaglutide) Mounjaro, Zepbound (tirzepatide) Bydureon Bcise (exanatide extended release)  DO NOT TAKE 1 DAY PRIOR TO YOUR TEST Rybelsus (semaglutide) Adlyxin (lixisenatide) Victoza (liraglutide) Byetta (exanatide) ___________________________________________________________________________   Follow up as needed.  Thank you for trusting me with your gastrointestinal care!   Ellouise Console, PA-C  _______________________________________________________  If your blood pressure at your visit was 140/90 or greater, please contact your primary care physician to follow up on this.  _______________________________________________________  If you are age 9 or older, your body mass index should be between 23-30. Your Body mass index is 24.17 kg/m. If this is out of the aforementioned range listed, please consider follow up with your Primary Care Provider.  If you are age 74 or younger, your body mass index should be between 19-25. Your Body mass index is 24.17 kg/m. If this is out of the aformentioned range listed, please consider follow up with your Primary Care Provider.   ________________________________________________________  The [facility] providers would like to encourage you to use MYCHART to communicate with providers for non-urgent requests or questions.  Due to long hold times on the telephone, sending your provider a message by [facility] may be a faster and more efficient way to get a response.  Please allow 48  business hours for a response.  Please remember that this is for non-urgent requests.  _______________________________________________________  [facility] is using a team-based approach to care.  Your team is made up of your doctor and two to three APPS. Our APPS (Nurse Practitioners and Physician Assistants) work with your physician to ensure care continuity for you. They are fully qualified to address your health concerns and develop a treatment plan. They communicate directly with your gastroenterologist to care for you. Seeing the Advanced Practice Practitioners on your physician's team can help you by facilitating care more promptly, often allowing for earlier appointments, access to diagnostic testing, procedures, and other specialty referrals.

## 2024-08-19 ENCOUNTER — Telehealth: Payer: Self-pay

## 2024-08-19 NOTE — Telephone Encounter (Signed)
 Due to the clinic closing on Monday 08/22/2024, the patients procedure was rescheduled. Patient agreed to call back if she has any questions.

## 2024-08-22 ENCOUNTER — Encounter: Admitting: Internal Medicine

## 2024-08-24 ENCOUNTER — Encounter: Admitting: Internal Medicine

## 2024-09-13 ENCOUNTER — Encounter: Admitting: Internal Medicine
